# Patient Record
Sex: Male | Born: 1970 | Race: White | Hispanic: No | Smoking: Never smoker
Health system: Southern US, Community
[De-identification: ages and names within clinical notes are randomized; demographics above are authoritative.]

## PROBLEM LIST (undated history)

## (undated) DIAGNOSIS — F329 Major depressive disorder, single episode, unspecified: Secondary | ICD-10-CM

## (undated) DIAGNOSIS — T7840XA Allergy, unspecified, initial encounter: Secondary | ICD-10-CM

## (undated) DIAGNOSIS — M1990 Unspecified osteoarthritis, unspecified site: Secondary | ICD-10-CM

## (undated) DIAGNOSIS — I4821 Permanent atrial fibrillation: Secondary | ICD-10-CM

## (undated) DIAGNOSIS — B019 Varicella without complication: Secondary | ICD-10-CM

## (undated) DIAGNOSIS — I10 Essential (primary) hypertension: Secondary | ICD-10-CM

## (undated) DIAGNOSIS — I639 Cerebral infarction, unspecified: Secondary | ICD-10-CM

## (undated) DIAGNOSIS — I509 Heart failure, unspecified: Secondary | ICD-10-CM

## (undated) DIAGNOSIS — Z8679 Personal history of other diseases of the circulatory system: Secondary | ICD-10-CM

## (undated) DIAGNOSIS — R601 Generalized edema: Secondary | ICD-10-CM

## (undated) DIAGNOSIS — C801 Malignant (primary) neoplasm, unspecified: Secondary | ICD-10-CM

## (undated) DIAGNOSIS — E119 Type 2 diabetes mellitus without complications: Secondary | ICD-10-CM

## (undated) DIAGNOSIS — N183 Chronic kidney disease, stage 3 (moderate): Secondary | ICD-10-CM

## (undated) DIAGNOSIS — I428 Other cardiomyopathies: Secondary | ICD-10-CM

## (undated) DIAGNOSIS — R531 Weakness: Secondary | ICD-10-CM

## (undated) DIAGNOSIS — F32A Depression, unspecified: Secondary | ICD-10-CM

## (undated) DIAGNOSIS — E7800 Pure hypercholesterolemia, unspecified: Secondary | ICD-10-CM

## (undated) DIAGNOSIS — I5022 Chronic systolic (congestive) heart failure: Secondary | ICD-10-CM

## (undated) HISTORY — DX: Depression, unspecified: F32.A

## (undated) HISTORY — DX: Unspecified osteoarthritis, unspecified site: M19.90

## (undated) HISTORY — DX: Varicella without complication: B01.9

## (undated) HISTORY — DX: Generalized edema: R60.1

## (undated) HISTORY — PX: HERNIA REPAIR: SHX51

## (undated) HISTORY — DX: Chronic kidney disease, stage 3 (moderate): N18.3

## (undated) HISTORY — DX: Type 2 diabetes mellitus without complications: E11.9

## (undated) HISTORY — DX: Cerebral infarction, unspecified: I63.9

## (undated) HISTORY — DX: Weakness: R53.1

## (undated) HISTORY — DX: Personal history of other diseases of the circulatory system: Z86.79

## (undated) HISTORY — DX: Allergy, unspecified, initial encounter: T78.40XA

## (undated) HISTORY — PX: ABDOMINAL HERNIA REPAIR: SHX539

---
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
## 2006-12-08 ENCOUNTER — Encounter (INDEPENDENT_AMBULATORY_CARE_PROVIDER_SITE_OTHER): Payer: Self-pay | Admitting: General Surgery

## 2006-12-08 ENCOUNTER — Inpatient Hospital Stay (HOSPITAL_COMMUNITY): Admission: EM | Admit: 2006-12-08 | Discharge: 2006-12-13 | Payer: Self-pay | Admitting: Emergency Medicine

## 2008-12-04 ENCOUNTER — Emergency Department (HOSPITAL_COMMUNITY): Admission: EM | Admit: 2008-12-04 | Discharge: 2008-12-04 | Payer: Self-pay | Admitting: Emergency Medicine

## 2010-07-06 LAB — CBC
Hemoglobin: 15.4 g/dL (ref 13.0–17.0)
MCHC: 33.7 g/dL (ref 30.0–36.0)
RBC: 5.38 MIL/uL (ref 4.22–5.81)
RDW: 12.7 % (ref 11.5–15.5)

## 2010-07-06 LAB — COMPREHENSIVE METABOLIC PANEL
ALT: 35 U/L (ref 0–53)
AST: 25 U/L (ref 0–37)
Alkaline Phosphatase: 70 U/L (ref 39–117)
Calcium: 9.2 mg/dL (ref 8.4–10.5)
GFR calc Af Amer: >60 mL/min (ref >60)
Potassium: 4.2 mEq/L (ref 3.5–5.1)
Sodium: 134 mEq/L — ABNORMAL LOW (ref 135–145)
Total Protein: 7.2 g/dL (ref 6.0–8.3)

## 2010-07-06 LAB — DIFFERENTIAL
Basophils Relative: 1 % (ref 0–1)
Eosinophils Absolute: 0.1 10*3/uL (ref 0.0–0.7)
Eosinophils Relative: 1 % (ref 0–5)
Lymphs Abs: 2.4 10*3/uL (ref 0.7–4.0)
Monocytes Absolute: 0.6 10*3/uL (ref 0.1–1.0)
Monocytes Relative: 5 % (ref 3–12)

## 2010-07-06 LAB — URINALYSIS, ROUTINE W REFLEX MICROSCOPIC
Bilirubin Urine: NEGATIVE
Glucose, UA: >1000 mg/dL — AB
Hgb urine dipstick: NEGATIVE
Ketones, ur: 15 mg/dL — AB
Leukocytes, UA: NEGATIVE
pH: 5 (ref 5.0–8.0)

## 2010-07-06 LAB — URINE MICROSCOPIC-ADD ON

## 2010-08-14 NOTE — H&P (Signed)
NAMEKERN, GINGRAS NO.:  [id number]   MEDICAL RECORD NO.:  [id number]          PATIENT TYPE:  EMS   LOCATION:  ED                           FACILITY:  [facility]   PHYSICIAN:  Ladell Pier, M.D.   DATE OF BIRTH:  1970-07-07   DATE OF ADMISSION:  12/08/2006  DATE OF DISCHARGE:                              HISTORY & PHYSICAL   CHIEF COMPLAINT:  Nausea, vomiting, abdominal pain.   HISTORY OF PRESENT ILLNESS:  The patient is a 40 year old white male who  presented to the ED with nausea, vomiting times 24 hours.  He said he  has vomited multiple times over the 24 period.  He said that he also  passed out at work secondary to feeling dehydrated and weak from the  nausea, vomiting.  He has had no sick contacts, no fevers no chills.  He  has not been taking his regular medications for diabetes and  hypertension secondary to having no money and no insurance for either  medications.   PAST MEDICAL HISTORY:  Significant for diabetes, hypertension,  dyslipidemia, morbid obesity, history of arthritis in the knees with  history of knee surgery in the past.   FAMILY HISTORY:  Mother and father both have diabetes.  His father also  has high cholesterol and heart disease.  Mother is 64.  Father 100.   SOCIAL HISTORY:  No tobacco or alcohol use, is married.  He works as a  Production designer, theatre/television/film at [employer].  No children.   MEDICATIONS:  He is presently not taking his medications but he is he  was placed on:  1. Glucophage 500 mg 3 q.h.s.  2. Gemfibrozil b.i.d.  3. Naprosyn b.i.d. p.r.n.  4. Norvasc 10 mg daily.  5. Actos 30 mg daily.  6. Benazepril 20 mg daily.  7. Aspirin 81 mg daily.   ALLERGIES:  PENICILLIN causes rash.   REVIEW OF SYSTEMS:  As per stated in HPI.   PHYSICAL EXAMINATION:  Temperature 98.4, blood pressure 144/88, pulse of  87, respirations 20, pulse ox 95% on room air.  HEENT: Head is normocephalic, atraumatic.  Pupils equal and reactive to  light without  erythema.  CARDIOVASCULAR:  Regular rhythm.  LUNGS:  Clear bilaterally.  ABDOMEN:  Soft, nontender, nondistended.  Positive bowel sounds.  He  does have some tenderness over the umbilical hernia in the umbilical  area.  EXTREMITIES:  No edema, 2+ DP pulses bilaterally.  NEUROLOGICAL EXAM:  Nonfocal.   LABS:  Sodium 135, potassium 4.4, chloride 100, pCO2 23, BUN 8,  creatinine 0.79, glucose 333.  WBC 14.4, hemoglobin 16.2, platelet 310,  MCV 32.2, AST 26, ALT 44.   ASSESSMENT/PLAN:  1. Nausea, vomiting.  2. Diabetes.  3. Hypertension.  4. Obesity.  5. Leukocytosis.   Will admit the patient to the hospital, IV fluids.  Will restart his  diabetes medication in addition to Lantus and sliding scale insulin,  will start him on Cipro for possibly bacterial gastroenteritis and will  get a CT of the abdomen and pelvis since he is tender over the hernia  region.  We will  continue Norvasc and hold the Lotensin with his mild  dehydration.      Ladell Pier, M.D.  Electronically Signed     NJ/MEDQ  D:  12/08/2006  T:  12/09/2006  Job:  [id number]

## 2010-08-14 NOTE — Consult Note (Signed)
NAMELEVERETT, CAMPLIN               ACCOUNT NO.:  [id number]   MEDICAL RECORD NO.:  [id number]          PATIENT TYPE:  INP   LOCATION:  1314                         FACILITY:  [facility]   PHYSICIAN:  Angelia Mould. Derrell Lolling, M.D.DATE OF BIRTH:  November 19, 1970   DATE OF CONSULTATION:  12/09/2006  DATE OF DISCHARGE:                                 CONSULTATION   REASON FOR CONSULTATION:  Evaluate bowel obstruction and hernia.   HISTORY OF PRESENT ILLNESS:  This is a 40 year old white man with morbid  obesity, diabetes and hypertension.  He reports a 48-hour history of  abdominal pain, nausea and vomiting.  He felt dehydrated and passed out  at work.  This has never happened to him before.  He has not had any  abdominal surgery before.  He came to the emergency room and was  admitted last night by Dr. Olena Leatherwood.  A CT scan has been obtained which  shows a small bowel obstruction with a transition zone in what appears  to be an incarcerated ventral hernia in the region of the umbilicus.  I  was called to see him this morning by Dr. Karilyn Cota once the CT scan was  reviewed.  It is notable the patient has not been taking his regular  medications for diabetes and hypertension stating that he cannot afford  them.  He does not have a primary care physician.   PAST MEDICAL HISTORY:  Significant for diabetes, hypertension,  hyperlipidemia, morbid obesity, history of arthritis in the knees and  history of knee surgery in the past.  No history of abdominal surgery in  the past.   MEDICATIONS:  He is presently not taking his medications but he is  supposed to be taking Glucophage 500 mg at bedtime, gemfibrozil b.i.d..  Naprosyn b.i.d. p.r.n., Norvasc 10 mg daily, Actos 30 mg daily,  benazepril 20 mg daily.  Aspirin 81 mg daily.   DRUG ALLERGIES:  PENICILLIN causes a rash.   FAMILY HISTORY:  Mother and father both have diabetes.  Father has had  elevated cholesterol and heart disease.  Mother is 58, father is  60.   SOCIAL HISTORY:  Denies tobacco or alcohol use.  He is married.  Works  as a Production designer, theatre/television/film at [employer].  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   REVIEW OF SYSTEMS:  15 system review of systems performed and is  noncontributory except as described above.   PHYSICAL EXAM:  Morbidly obese white man in minimal distress.  Temperature 98.1, blood pressure 155/96, pulse 94, respiratory rate 20.  NECK:  No mass.  No obvious jugular distention.  LUNGS:  Clear to auscultation.  No chest wall tenderness.  HEART:  Regular rate and rhythm.  No ectopy.  Radial pulses are  palpable.  ABDOMEN:  Morbidly obese, soft.  He has an obvious incarcerated hernia  projecting above the umbilicus.  The umbilicus itself is wide and deep  and has a chronic dermatitis with foul-smelling fluid in it.  There is  no purulence however.  The rest of the abdomen is fairly soft but  palpation laterally causes pain centrally at the  umbilicus.  The hernia  is not reducible.  I do not feel any other masses.  EXTREMITIES:  He moves all four extremities well without deformity.  NEUROLOGIC:  No gross motor or sensory deficits.   ADMISSION DATA:  A CT scan described above.  Incarcerated, possibly  strangulated ventral hernia with small bowel obstruction.  Admission  glucose is 333.  Sodium 135, potassium 4.4, chloride 100, BUN eight,  hemoglobin 16.2, white blood cell count 14,400   ASSESSMENT:  1. Small bowel obstruction secondary to incarcerated and possibly      strangulated ventral and umbilical hernia.  2. Morbid obesity.  3. Dermatitis and possibly cellulitis of the umbilicus, question      whether this is chronic.  4. Diabetes mellitus.  5. Hypertension.  6. Medical noncompliance.   PLAN:  1. The patient will be taken to operating room later today for      exploratory laparotomy, possible bowel resection, release of his      small bowel obstruction and repair of his hernia.  This repair will      probably need to be a primary  repair due to the high risk of      infection of a piece of mesh.  2. I have discussed the indications and details of surgery with the      patient and his multiple family members.  Risks and complications      have been outlined, including but not limited to bleeding,      infection, dehiscence of the hernia, recurrent hernia, bowel      resection, intra-abdominal abscess, cardiac, pulmonary and      thromboembolic problems.  He seems to understand these issues well.      At this time all his questions are answered.  He is in full      agreement with this plan.      Angelia Mould. Derrell Lolling, M.D.  Electronically Signed     HMI/MEDQ  D:  12/09/2006  T:  12/09/2006  Job:  [id number]   cc:   Wilson Singer, M.D.  Fax: [phone]   Ladell Pier, M.D.

## 2010-08-14 NOTE — Op Note (Signed)
Jeremiah Vance, Jeremiah Vance               ACCOUNT NO.:  [id number]   MEDICAL RECORD NO.:  [id number]          PATIENT TYPE:  INP   LOCATION:  1314                         FACILITY:  [facility]   PHYSICIAN:  Angelia Mould. Derrell Lolling, M.D.DATE OF BIRTH:  1971/01/23   DATE OF PROCEDURE:  12/09/2006  DATE OF DISCHARGE:                               OPERATIVE REPORT   PREOPERATIVE DIAGNOSIS:  Small-bowel obstruction, incarcerated ventral  hernia.   POSTOPERATIVE DIAGNOSIS:  Small-bowel obstruction, incarcerated ventral  hernia.   OPERATION PERFORMED:  1. Exploratory laparotomy with lysis of adhesions to release small      bowel obstruction.  2. Partial omentectomy.  3. Excision of umbilicus.  4. Repair of ventral hernia.   SURGEON:  Dr. Claud Kelp.   FIRST ASSISTANT:  Dr. Leonie Man.   OPERATIVE INDICATIONS:  This is a 40 year old morbidly obese gentleman  with diabetes, hypertension and medical noncompliance with his  medications.  He does not know whether he had an umbilical hernia before  but presents with a 2-day history of abdominal pain, a mass above his  umbilicus and nausea and vomiting.  On exam he has a soft ball sized  mass protruding at the superior rim of the umbilicus.  The umbilicus was  reddened as with chronic dermatitis possibly cellulitis.  CT scan had  been obtained showing loops of small bowel caught in ventral hernia and  that was thought to be the transition point for a small bowel  obstruction.  I was called after the CT scan was done.  He is operated  upon emergently.   OPERATIVE FINDINGS:  The patient had a ventral hernia defect  approximately 8 cm in diameter.  A large amount of omentum was caught in  this and one loop of small bowel which was edematous but was perfectly  viable.  There were no other adhesions.  There was no foul odor.  There  was no sign of any ischemia.  The omentum looked like it had lost the  right of domain and could not be returned to the  abdomen and part of it  had to be resected.  The umbilical skin looked very bad and so I  resected that to avoid skin necrosis and infection.   OPERATIVE TECHNIQUE:  Following induction of general endotracheal  anesthesia, Foley catheter was inserted without difficulty.  The abdomen  was prepped and draped in sterile fashion.  The patient had previously  been given antibiotics.  A vertically oriented elliptical incision was  made to excise the umbilical skin.  Dissection was carried down into  subcutaneous tissue.  We dissected the skin away from the hernia sac.  We dissected the hernia sac away from the subcutaneous tissue all the  way around.  We entered the hernia sac inferiorly near the umbilicus and  found that we could open the hernia sac easily without injuring the  small bowel.  The small bowel was noted to be viable.   We discarded the umbilical skin.   We had to incise the fascia inferiorly for about 2 cm to open up enough  to where we could return the small bowel to the abdominal cavity and  then that was easy enough to do.  We felt around in the abdominal  cavity, felt no other hernia defects.  Felt no other adhesions.  We  resected the omentum by dividing it using the LigaSure device.  This  omentum was sent for routine histology.   We undermined subcutaneous tissue for about 3 cm in all directions.  We  closed the fascia in the midline with multiple interrupted sutures of #1  Novofil.  We then tied all the sutures and it provided a very secure  primary repair.  We did not feel that synthetic mesh could be used I due  to the infected nature of the umbilicus.  The wound was irrigated with  saline.  The subcutaneous tissue was closed with interrupted sutures of  2-0 Vicryl.  Skin was closed with skin staples and 3-0 nylon sutures.  Clean bandages placed and the patient taken to recovery room in stable  condition.  Estimated blood loss was about 150 mL.  Complications  none.  Sponge needle and instrument counts were correct.      Angelia Mould. Derrell Lolling, M.D.  Electronically Signed     HMI/MEDQ  D:  12/09/2006  T:  12/10/2006  Job:  [id number]   cc:   Wilson Singer, M.D.  Fax: [phone]

## 2010-08-17 NOTE — Discharge Summary (Signed)
NAMEDIONTAE, ROUTE               ACCOUNT NO.:  [id number]   MEDICAL RECORD NO.:  [id number]          PATIENT TYPE:  INP   LOCATION:  1314                         FACILITY:  [facility]   PHYSICIAN:  Wilson Singer, M.D.DATE OF BIRTH:  01-Mar-1971   DATE OF ADMISSION:  12/08/2006  DATE OF DISCHARGE:  12/13/2006                               DISCHARGE SUMMARY   FINAL DISCHARGE DIAGNOSES:  1. Small bowel obstruction status post repair of ventral hernia.  2. Hypertension.  3. Type 2 non-insulin-dependent diabetes mellitus.   CONDITION ON DISCHARGE:  Stable.   DISCHARGE MEDICATIONS:  1. Metformin 500 mg b.i.d.  2. Actos 30 mg daily.   HISTORY:  This 40 year old man was admitted with symptoms of nausea,  vomiting, and abdominal pain.  Please see initial history and physical  examination done by Dr. Ladell Pier.   HOSPITAL PROGRESS:  The patient was admitted and initially it was not  clear what the source of the abdominal pain was.  He underwent a CT of  the abdomen and he was found to have a small bowel obstruction due to a  possible incarcerated or strangulated ventral hernia.  He was seen by  Dr. Claud Kelp, surgeon who felt he needed surgery soon and he  underwent surgery on December 09, 2006, where he had an exploratory  laparotomy and release of adhesions.  Postoperatively, he did well and was slowly able to be weaned off  intravenous fluids and was slowly able to be started on a diet.  He did  well with the diet.  His diabetes was controlled by subcutaneous insulin  after he was in the hospital.  On the day of discharge, after he was  cleared from surgery's point-of-view, he looked well.  He had opened his  bowels, and he had tolerated a fluid diet.   PHYSICAL EXAMINATION:  VITAL SIGNS:  Temperature 99.1, blood pressure  106/72, pulse 93, saturation 92% on room air.  LUNGS:  Fields were clear.  ABDOMEN:  Soft.   INVESTIGATIONS:  Done the day before showed a sodium of  137, potassium  3.7, bicarbonate 35, BUN 3, creatinine 0.77.  Hemoglobin 12.8, white  blood cell count 9.3, platelets 227.   FURTHER DISPOSITION:  The patient will be discharged and will follow up  for the surgery.  He has been given Tylox for pain and he has been  restarted on his home oral hypoglycemic medications.  He needs to follow  up with Dr. Claud Kelp, surgeon, in about one week's time.      Wilson Singer, M.D.  Electronically Signed     NCG/MEDQ  D:  01/15/2007  T:  01/15/2007  Job:  [id number]

## 2011-01-11 LAB — COMPREHENSIVE METABOLIC PANEL
ALT: 30
ALT: 44
AST: 18
Albumin: 2.7 — ABNORMAL LOW
Albumin: 3.6
Alkaline Phosphatase: 76
CO2: 33 — ABNORMAL HIGH
Calcium: 8.1 — ABNORMAL LOW
Chloride: 100
GFR calc Af Amer: >60
GFR calc non Af Amer: >60
Glucose, Bld: 333 — ABNORMAL HIGH
Potassium: 4.4
Sodium: 135
Sodium: 136
Total Protein: 5.9 — ABNORMAL LOW
Total Protein: 8.2

## 2011-01-11 LAB — BASIC METABOLIC PANEL
BUN: 1 — ABNORMAL LOW
BUN: 3 — ABNORMAL LOW
CO2: 34 — ABNORMAL HIGH
Calcium: 8.4
Calcium: 8.6
Chloride: 98
Creatinine, Ser: 0.69
GFR calc Af Amer: >60
GFR calc Af Amer: >60
GFR calc non Af Amer: >60
GFR calc non Af Amer: >60
GFR calc non Af Amer: >60
GFR calc non Af Amer: >60
Glucose, Bld: 121 — ABNORMAL HIGH
Glucose, Bld: 202 — ABNORMAL HIGH
Potassium: 3.3 — ABNORMAL LOW
Potassium: 3.7
Potassium: 4.5
Sodium: 135
Sodium: 136
Sodium: 137
Sodium: 137

## 2011-01-11 LAB — DIFFERENTIAL
Basophils Relative: 0
Eosinophils Absolute: 0
Monocytes Absolute: 0.4
Monocytes Relative: 3
Neutrophils Relative %: 90 — ABNORMAL HIGH

## 2011-01-11 LAB — CBC
HCT: 37.5 — ABNORMAL LOW
HCT: 43.4
Hemoglobin: 12.8 — ABNORMAL LOW
Hemoglobin: 13.2
Hemoglobin: 14.9
Hemoglobin: 16.2
MCHC: 34.1
MCHC: 34.1
Platelets: 244
Platelets: 269
RBC: 4.51
RBC: 4.71
RDW: 12.4
RDW: 12.6
RDW: 13.1
WBC: 14.4 — ABNORMAL HIGH
WBC: 9.3
WBC: 9.5

## 2011-01-11 LAB — URINALYSIS, ROUTINE W REFLEX MICROSCOPIC
Bilirubin Urine: NEGATIVE
Glucose, UA: >1000 — AB
pH: 5.5

## 2011-01-11 LAB — CULTURE, BLOOD (ROUTINE X 2): Culture: NO GROWTH

## 2011-01-11 LAB — URINE MICROSCOPIC-ADD ON

## 2011-01-11 LAB — LIPID PANEL
Total CHOL/HDL Ratio: 5.8
VLDL: 18

## 2011-01-11 LAB — WOUND CULTURE

## 2017-06-30 DIAGNOSIS — N183 Chronic kidney disease, stage 3 unspecified: Secondary | ICD-10-CM

## 2017-06-30 HISTORY — DX: Chronic kidney disease, stage 3 unspecified: N18.30

## 2017-07-10 ENCOUNTER — Emergency Department (HOSPITAL_COMMUNITY): Payer: Self-pay

## 2017-07-10 ENCOUNTER — Other Ambulatory Visit: Payer: Self-pay

## 2017-07-10 ENCOUNTER — Encounter (HOSPITAL_COMMUNITY): Payer: Self-pay | Admitting: Emergency Medicine

## 2017-07-10 ENCOUNTER — Inpatient Hospital Stay (HOSPITAL_COMMUNITY)
Admission: EM | Admit: 2017-07-10 | Discharge: 2017-07-18 | DRG: 291 | Disposition: A | Discharge status: Home / Self Care | Payer: Self-pay | Attending: Internal Medicine | Admitting: Internal Medicine

## 2017-07-10 DIAGNOSIS — D649 Anemia, unspecified: Secondary | ICD-10-CM | POA: Diagnosis present

## 2017-07-10 DIAGNOSIS — R808 Other proteinuria: Secondary | ICD-10-CM

## 2017-07-10 DIAGNOSIS — R601 Generalized edema: Secondary | ICD-10-CM

## 2017-07-10 DIAGNOSIS — R945 Abnormal results of liver function studies: Secondary | ICD-10-CM

## 2017-07-10 DIAGNOSIS — E8809 Other disorders of plasma-protein metabolism, not elsewhere classified: Secondary | ICD-10-CM | POA: Diagnosis present

## 2017-07-10 DIAGNOSIS — I5021 Acute systolic (congestive) heart failure: Secondary | ICD-10-CM | POA: Diagnosis present

## 2017-07-10 DIAGNOSIS — R809 Proteinuria, unspecified: Secondary | ICD-10-CM

## 2017-07-10 DIAGNOSIS — N19 Unspecified kidney failure: Secondary | ICD-10-CM | POA: Diagnosis not present

## 2017-07-10 DIAGNOSIS — I110 Hypertensive heart disease with heart failure: Principal | ICD-10-CM | POA: Diagnosis present

## 2017-07-10 DIAGNOSIS — E1121 Type 2 diabetes mellitus with diabetic nephropathy: Secondary | ICD-10-CM | POA: Diagnosis present

## 2017-07-10 DIAGNOSIS — Z833 Family history of diabetes mellitus: Secondary | ICD-10-CM

## 2017-07-10 DIAGNOSIS — K760 Fatty (change of) liver, not elsewhere classified: Secondary | ICD-10-CM | POA: Diagnosis present

## 2017-07-10 DIAGNOSIS — B354 Tinea corporis: Secondary | ICD-10-CM | POA: Diagnosis present

## 2017-07-10 DIAGNOSIS — I10 Essential (primary) hypertension: Secondary | ICD-10-CM | POA: Diagnosis present

## 2017-07-10 DIAGNOSIS — N5089 Other specified disorders of the male genital organs: Secondary | ICD-10-CM | POA: Diagnosis present

## 2017-07-10 DIAGNOSIS — E1165 Type 2 diabetes mellitus with hyperglycemia: Secondary | ICD-10-CM

## 2017-07-10 DIAGNOSIS — I5030 Unspecified diastolic (congestive) heart failure: Secondary | ICD-10-CM

## 2017-07-10 DIAGNOSIS — R7989 Other specified abnormal findings of blood chemistry: Secondary | ICD-10-CM

## 2017-07-10 DIAGNOSIS — Z6841 Body Mass Index (BMI) 40.0 and over, adult: Secondary | ICD-10-CM

## 2017-07-10 DIAGNOSIS — K8020 Calculus of gallbladder without cholecystitis without obstruction: Secondary | ICD-10-CM | POA: Diagnosis present

## 2017-07-10 DIAGNOSIS — E8770 Fluid overload, unspecified: Secondary | ICD-10-CM | POA: Diagnosis present

## 2017-07-10 HISTORY — DX: Anemia, unspecified: D64.9

## 2017-07-10 HISTORY — DX: Morbid (severe) obesity due to excess calories: E66.01

## 2017-07-10 HISTORY — DX: Other disorders of bilirubin metabolism: E80.6

## 2017-07-10 HISTORY — DX: Essential (primary) hypertension: I10

## 2017-07-10 LAB — URINALYSIS, ROUTINE W REFLEX MICROSCOPIC
BILIRUBIN URINE: NEGATIVE
Glucose, UA: NEGATIVE mg/dL
Hgb urine dipstick: NEGATIVE
KETONES UR: NEGATIVE mg/dL
LEUKOCYTES UA: NEGATIVE
Nitrite: NEGATIVE
Protein, ur: 100 mg/dL — AB
Specific Gravity, Urine: 1.014 (ref 1.005–1.030)
pH: 6 (ref 5.0–8.0)

## 2017-07-10 LAB — BRAIN NATRIURETIC PEPTIDE: B Natriuretic Peptide: 852 pg/mL — ABNORMAL HIGH (ref 0.0–100.0)

## 2017-07-10 LAB — COMPREHENSIVE METABOLIC PANEL
ALBUMIN: 3 g/dL — AB (ref 3.5–5.0)
ALT: 12 U/L — ABNORMAL LOW (ref 17–63)
ANION GAP: 11 (ref 5–15)
AST: 22 U/L (ref 15–41)
Alkaline Phosphatase: 240 U/L — ABNORMAL HIGH (ref 38–126)
BUN: 16 mg/dL (ref 6–20)
CHLORIDE: 104 mmol/L (ref 101–111)
CO2: 23 mmol/L (ref 22–32)
Calcium: 8.7 mg/dL — ABNORMAL LOW (ref 8.9–10.3)
Creatinine, Ser: 0.85 mg/dL (ref 0.61–1.24)
GFR calc Af Amer: >60 mL/min (ref >60)
GFR calc non Af Amer: >60 mL/min (ref >60)
GLUCOSE: 115 mg/dL — AB (ref 65–99)
POTASSIUM: 4.1 mmol/L (ref 3.5–5.1)
SODIUM: 138 mmol/L (ref 135–145)
Total Bilirubin: 2.5 mg/dL — ABNORMAL HIGH (ref 0.3–1.2)
Total Protein: 7.3 g/dL (ref 6.5–8.1)

## 2017-07-10 LAB — GLUCOSE, CAPILLARY: Glucose-Capillary: 97 mg/dL (ref 65–99)

## 2017-07-10 LAB — CBC WITH DIFFERENTIAL/PLATELET
Basophils Absolute: 0 10*3/uL (ref 0.0–0.1)
Basophils Relative: 1 %
Eosinophils Absolute: 0.2 10*3/uL (ref 0.0–0.7)
Eosinophils Relative: 2 %
HEMATOCRIT: 39.7 % (ref 39.0–52.0)
Hemoglobin: 12.4 g/dL — ABNORMAL LOW (ref 13.0–17.0)
LYMPHS ABS: 1.3 10*3/uL (ref 0.7–4.0)
LYMPHS PCT: 21 %
MCH: 28.3 pg (ref 26.0–34.0)
MCHC: 31.2 g/dL (ref 30.0–36.0)
MCV: 90.6 fL (ref 78.0–100.0)
MONO ABS: 0.6 10*3/uL (ref 0.1–1.0)
MONOS PCT: 10 %
Neutro Abs: 4.4 10*3/uL (ref 1.7–7.7)
Neutrophils Relative %: 66 %
Platelets: 310 10*3/uL (ref 150–400)
RBC: 4.38 MIL/uL (ref 4.22–5.81)
RDW: 14.9 % (ref 11.5–15.5)
WBC: 6.5 10*3/uL (ref 4.0–10.5)

## 2017-07-10 LAB — MAGNESIUM: Magnesium: 1.8 mg/dL (ref 1.7–2.4)

## 2017-07-10 LAB — CBG MONITORING, ED: Glucose-Capillary: 112 mg/dL — ABNORMAL HIGH (ref 65–99)

## 2017-07-10 MED ORDER — FENTANYL CITRATE (PF) 100 MCG/2ML IJ SOLN
50.0000 ug | Freq: Once | INTRAMUSCULAR | Status: AC
  Administered 2017-07-10: 50 ug via INTRAVENOUS
  Filled 2017-07-10: qty 2

## 2017-07-10 MED ORDER — FUROSEMIDE 10 MG/ML IJ SOLN
60.0000 mg | Freq: Once | INTRAMUSCULAR | Status: AC
  Administered 2017-07-10: 60 mg via INTRAVENOUS
  Filled 2017-07-10: qty 6

## 2017-07-10 MED ORDER — ONDANSETRON HCL 4 MG/2ML IJ SOLN: 4.0000 mg | Freq: Four times a day (QID) | INTRAMUSCULAR | Status: DC | PRN

## 2017-07-10 MED ORDER — ONDANSETRON HCL 4 MG PO TABS: 4.0000 mg | ORAL_TABLET | Freq: Four times a day (QID) | ORAL | Status: DC | PRN

## 2017-07-10 MED ORDER — HYDROCODONE-ACETAMINOPHEN 7.5-325 MG PO TABS
1.0000 | ORAL_TABLET | Freq: Four times a day (QID) | ORAL | Status: DC | PRN
  Administered 2017-07-10 – 2017-07-12 (×4): 1 via ORAL
  Administered 2017-07-12 – 2017-07-17 (×8): 2 via ORAL
  Administered 2017-07-18: 1 via ORAL
  Filled 2017-07-10 (×2): qty 2
  Filled 2017-07-10 (×2): qty 1
  Filled 2017-07-10 (×4): qty 2
  Filled 2017-07-10: qty 1
  Filled 2017-07-10 (×2): qty 2
  Filled 2017-07-10 (×2): qty 1

## 2017-07-10 MED ORDER — POTASSIUM CHLORIDE CRYS ER 20 MEQ PO TBCR
20.0000 meq | EXTENDED_RELEASE_TABLET | Freq: Three times a day (TID) | ORAL | Status: DC
  Administered 2017-07-11 – 2017-07-17 (×21): 20 meq via ORAL
  Filled 2017-07-10 (×21): qty 1

## 2017-07-10 MED ORDER — POTASSIUM CHLORIDE CRYS ER 20 MEQ PO TBCR
40.0000 meq | EXTENDED_RELEASE_TABLET | Freq: Once | ORAL | Status: AC
Start: 2017-07-10 — End: 2017-07-10
  Administered 2017-07-10: 40 meq via ORAL
  Filled 2017-07-10: qty 2

## 2017-07-10 MED ORDER — ENALAPRILAT 1.25 MG/ML IV SOLN
2.5000 mg | Freq: Once | INTRAVENOUS | Status: AC
  Administered 2017-07-10: 2.5 mg via INTRAVENOUS
  Filled 2017-07-10: qty 2

## 2017-07-10 MED ORDER — LISINOPRIL 5 MG PO TABS
5.0000 mg | ORAL_TABLET | Freq: Every day | ORAL | Status: DC
  Administered 2017-07-11: 5 mg via ORAL
  Filled 2017-07-10: qty 1

## 2017-07-10 MED ORDER — IOPAMIDOL (ISOVUE-300) INJECTION 61%
125.0000 mL | Freq: Once | INTRAVENOUS | Status: AC | PRN
  Administered 2017-07-10: 125 mL via INTRAVENOUS

## 2017-07-10 MED ORDER — ACETAMINOPHEN 325 MG PO TABS: 650.0000 mg | ORAL_TABLET | Freq: Four times a day (QID) | ORAL | Status: DC | PRN

## 2017-07-10 MED ORDER — FUROSEMIDE 10 MG/ML IJ SOLN
80.0000 mg | Freq: Two times a day (BID) | INTRAMUSCULAR | Status: DC
  Administered 2017-07-11 – 2017-07-17 (×14): 80 mg via INTRAVENOUS
  Filled 2017-07-10 (×15): qty 8

## 2017-07-10 NOTE — Progress Notes (Signed)
Attempted to get report from ER with no response.

## 2017-07-10 NOTE — ED Triage Notes (Signed)
Patient reports edema and pain to his testicles that started this am.

## 2017-07-10 NOTE — ED Provider Notes (Signed)
Emergency Department Provider Note   I have reviewed the triage vital signs and the nursing notes.   HISTORY  Chief Complaint Testicle Pain   HPI Jeremiah Vance is a 47 y.o. male with PMH of elevated BMI and DM resents to the emergency department for evaluation of acute onset scrotal pain and swelling.  Patient states that symptoms began abruptly this morning.  Denies fevers or chills.  No obvious rash.  Denies any abdominal pain, nausea, vomiting.  Denies lower extremity swelling.  Denies any injury to the area.  He states he did have a fall approximately a week ago but primarily hurt his left knee during that event.  No radiation of symptoms or modifying factors.  He has no dysuria, hesitancy, urgency. No history of similar.    Past Medical History:  Diagnosis Date  . Diabetes mellitus without complication ([place])   . Hypertension   . Morbid obesity [facility])     Patient Active Problem List   Diagnosis Date Noted  . Hypoalbuminemia 07/11/2017  . Tinea corporis 07/11/2017  . Volume overload 07/10/2017  . Hyperbilirubinemia 07/10/2017  . Anemia 07/10/2017  . Diabetes mellitus without complication ([place]) 93/26/7124  . Hypertension 07/10/2017    Past Surgical History:  Procedure Laterality Date  . HERNIA REPAIR        Allergies Patient has no known allergies.  Family History  Problem Relation Age of Onset  . Diabetes Father   . Diabetes Sister     Social History Social History   Tobacco Use  . Smoking status: Never Smoker  . Smokeless tobacco: Never Used  Substance Use Topics  . Alcohol use: Never    Frequency: Never  . Drug use: Never    Review of Systems  Constitutional: No fever/chills Eyes: No visual changes. ENT: No sore throat. Cardiovascular: Denies chest pain. Respiratory: Denies shortness of breath. Gastrointestinal: No abdominal pain.  No nausea, no vomiting.  No diarrhea.  No constipation. Genitourinary: Negative for  dysuria. Musculoskeletal: Negative for back pain. Skin: Negative for rash. Neurological: Negative for headaches, focal weakness or numbness.  10-point ROS otherwise negative.  ____________________________________________   PHYSICAL EXAM:  VITAL SIGNS: ED Triage Vitals [07/10/17 1245]  Enc Vitals Group     BP (!) 199/111     Pulse Rate 90     Resp 18     Temp 98.2 F (36.8 C)     Temp Source Oral     SpO2 96 %     Weight (!) 380 lb (172.4 kg)     Height _0  (1.778 m)     Pain Score 8   Constitutional: Alert and oriented. Well appearing and in no acute distress. Eyes: Conjunctivae are normal. Head: Atraumatic. Nose: No congestion/rhinnorhea. Mouth/Throat: Mucous membranes are moist.  Oropharynx non-erythematous. Neck: No stridor.  Cardiovascular: Normal rate, regular rhythm. Good peripheral circulation. Grossly normal heart sounds.   Respiratory: Normal respiratory effort.  No retractions. Lungs CTAB. Gastrointestinal: Soft and nontender. No distention. Some pitting edema in the lower abdomen.  Genitourinary: Diffuse scrotal edema with mild erythema. No skin breakdown or scrotal wound.  Musculoskeletal: No lower extremity tenderness. 3+ pitting edema bilaterally with edema in the lower abdominal wall and scrotum. No gross deformities of extremities. Neurologic:  Normal speech and language. No gross focal neurologic deficits are appreciated.  Skin:  Skin is warm, dry and intact. No rash noted.  ____________________________________________   LABS (all labs ordered are listed, but only abnormal results are  displayed)  Labs Reviewed  URINALYSIS, ROUTINE W REFLEX MICROSCOPIC - Abnormal; Notable for the following components:      Result Value   APPearance HAZY (*)    Protein, ur 100 (*)    Bacteria, UA RARE (*)    Squamous Epithelial / LPF 0-5 (*)    All other components within normal limits  COMPREHENSIVE METABOLIC PANEL - Abnormal; Notable for the following  components:   Glucose, Bld 115 (*)    Calcium 8.7 (*)    Albumin 3.0 (*)    ALT 12 (*)    Alkaline Phosphatase 240 (*)    Total Bilirubin 2.5 (*)    All other components within normal limits  CBC WITH DIFFERENTIAL/PLATELET - Abnormal; Notable for the following components:   Hemoglobin 12.4 (*)    All other components within normal limits  BRAIN NATRIURETIC PEPTIDE - Abnormal; Notable for the following components:   B Natriuretic Peptide 852.0 (*)    All other components within normal limits  BASIC METABOLIC PANEL - Abnormal; Notable for the following components:   Calcium 8.7 (*)    All other components within normal limits  CBC - Abnormal; Notable for the following components:   RBC 4.17 (*)    Hemoglobin 11.9 (*)    HCT 38.0 (*)    All other components within normal limits  HEPATIC FUNCTION PANEL - Abnormal; Notable for the following components:   Albumin 2.8 (*)    ALT 14 (*)    Alkaline Phosphatase 216 (*)    Total Bilirubin 2.7 (*)    Bilirubin, Direct 1.2 (*)    Indirect Bilirubin 1.5 (*)    All other components within normal limits  RETICULOCYTES - Abnormal; Notable for the following components:   RBC. 4.17 (*)    All other components within normal limits  CBG MONITORING, ED - Abnormal; Notable for the following components:   Glucose-Capillary 112 (*)    All other components within normal limits  MAGNESIUM  GLUCOSE, CAPILLARY  GLUCOSE, CAPILLARY  HIV ANTIBODY (ROUTINE TESTING)  HEMOGLOBIN A1C  VITAMIN B12  FOLATE  IRON AND TIBC  FERRITIN  HEPATITIS PANEL, ACUTE   ____________________________________________  RADIOLOGY  Dg Chest 2 View  Result Date: 07/10/2017 CLINICAL DATA:  Shortness of breath EXAM: CHEST - 2 VIEW COMPARISON:  CT 07/10/2017 FINDINGS: Cardiomegaly with vascular congestion and mild pulmonary edema. Small left pleural effusion. Moderate right pleural effusion. Straight edge on lateral view posteriorly suggests possible loculation. No  pneumothorax. IMPRESSION: 1. Cardiomegaly with vascular congestion and mild pulmonary edema 2. There are bilateral pleural effusions, small on the left and moderate on the right with possible loculation on the right. Electronically Signed   By: Donavan Foil M.D.   On: 07/10/2017 18:43   Ct Abdomen Pelvis W Contrast  Result Date: 07/10/2017 CLINICAL DATA:  47 y/o  M; edema and pain to the testicles. EXAM: CT ABDOMEN AND PELVIS WITH CONTRAST TECHNIQUE: Multidetector CT imaging of the abdomen and pelvis was performed using the standard protocol following bolus administration of intravenous contrast. CONTRAST:  158m ISOVUE-300 IOPAMIDOL (ISOVUE-300) INJECTION 61% COMPARISON:  12/04/2008 CT abdomen and pelvis FINDINGS: Lower chest: Moderate right and small left pleural effusions. Hepatobiliary: No focal liver lesion. Cholelithiasis. No biliary ductal dilatation. Pancreas: Unremarkable. No pancreatic ductal dilatation or surrounding inflammatory changes. Spleen: Small lucencies within the periphery of the spleen that persists on the delayed phase may represent small areas of infarction. No other focal splenic lesion identified. Adrenals/Urinary Tract: Adrenal  glands are unremarkable. Kidneys are normal, without renal calculi, focal lesion, or hydronephrosis. Bladder is unremarkable. Stomach/Bowel: Left paramedian paraumbilical hernia with broad neck measuring 8.1 x 4.4 cm ML BY CC containing fat and small bowel without proximal obstruction. This hernia is increased in size from prior study. Vascular/Lymphatic: Aortic atherosclerosis. No enlarged abdominal or pelvic lymph nodes. Reproductive: Prostate is unremarkable. Extensive scrotal edema. Anasarca. Other: Small volume of ascites. Musculoskeletal: No fracture is seen. IMPRESSION: 1. Anasarca, scrotal edema, small volume ascites, and bilateral pleural effusions compatible with third spacing. 2. Cholelithiasis. 3. Small peripheral lucencies in the spleen, possible  small infarctions. 4. Large paraumbilical hernia containing fat and small bowel without obstruction, increased in size. Electronically Signed   By: Kristine Garbe M.D.   On: 07/10/2017 19:00   US Renal  Result Date: 07/11/2017 CLINICAL DATA:  Proteinuria, diabetes, hypertension, obesity. EXAM: RENAL / URINARY TRACT ULTRASOUND COMPLETE COMPARISON:  None. FINDINGS: Right Kidney: Length: 12.5 cm. Echogenicity within normal limits. No mass or hydronephrosis visualized. Left Kidney: Length: 12 cm. Echogenicity within normal limits. No mass or hydronephrosis visualized. Bladder: Decompressed limiting characterization. IMPRESSION: Kidneys appear normal.  No hydronephrosis. Electronically Signed   By: Franki Cabot M.D.   On: 07/11/2017 09:23   US Scrotum W/doppler  Result Date: 07/10/2017 CLINICAL DATA:  Scrotal swelling and pain.  Pain for 1 day EXAM: SCROTAL ULTRASOUND DOPPLER ULTRASOUND OF THE TESTICLES TECHNIQUE: Complete ultrasound examination of the testicles, epididymis, and other scrotal structures was performed. Color and spectral Doppler ultrasound were also utilized to evaluate blood flow to the testicles. COMPARISON:  None. FINDINGS: Right testicle Measurements: Normal in size and homogeneous echotexture measuring 3.9 by 2.8 x 2.7 cm. No mass or microlithiasis visualized. Left testicle Measurements: Normal in size and homogeneous echotexture measuring 3.8 x 2.7 x 3.4 cm. No mass or microlithiasis visualized. Right epididymis:  Not identified Left epididymis:  But not identified Hydrocele:  None visualized. Varicocele:  None visualized. Pulsed Doppler interrogation of both testes demonstrates normal low resistance arterial and venous waveforms bilaterally. Other: There is extensive scrotal wall edema involving the LEFT hemiscrotum and RIGHT hemiscrotum IMPRESSION: 1. No evidence of testicular torsion or abscess. Normal vascular flow to the LEFT and RIGHT testicle which are normal size. 2.  Extensive edema and thickening of the scrotal wall. Electronically Signed   By: Suzy Bouchard M.D.   On: 07/10/2017 13:57   US Abdomen Limited Ruq  Result Date: 07/11/2017 CLINICAL DATA:  Hyperbilirubinemia. EXAM: ULTRASOUND ABDOMEN LIMITED RIGHT UPPER QUADRANT COMPARISON:  CT 07/10/2017. FINDINGS: Gallbladder: Multiple tiny gallstones noted. Gallstones measure up to 1.6 cm. Gallbladder wall thickness 5 mm. Negative Murphy sign. Common bile duct: Diameter: 3 mm Liver: Increased hepatic echogenicity consistent fatty infiltration and/or pedis cellular disease. Portal vein is patent on color Doppler imaging with normal direction of blood flow towards the liver. Incidental note made of right pleural effusion. IMPRESSION: 1. Cholelithiasis. Mild prominence of the gallbladder wall at 5 mm. Cholecystitis cannot be excluded. Negative Murphy sign. No biliary distention. 2. Increased hepatic echogenicity consistent fatty infiltration and/or hepatocellular disease. No focal hepatic abnormality identified. 3.  Right pleural effusion. Electronically Signed   By: Marcello Moores  Register   On: 07/11/2017 09:16    ____________________________________________   PROCEDURES  Procedure(s) performed:   Procedures  None ____________________________________________   INITIAL IMPRESSION / ASSESSMENT AND PLAN / ED COURSE  Pertinent labs & imaging results that were available during my care of the patient were reviewed by me and considered  in my medical decision making (see chart for details).  Onset scrotal edema and pain.  The patient is standing at bedside and is unable to sit because he is so uncomfortable.  There is mild erythema over the scrotum which is likely secondary mainly to significant swelling rather than cellulitis.  Ultrasound of the scrotum shows no torsion.  Labs reviewed with elevated alk phos and bilirubin.  The patient has no abdominal pain or tenderness over the gallbladder.  I have added on a BNP  and will send the patient for CT abdomen pelvis for further characterization of the scrotum and inguinal canals to rule out underlying infectious process but patient presenting with more of an anasarca picture.    07:00 PM CT imaging resulted with extensive fluid/anasarca picture with pleural effusions, ascites, and diffuse edema. No infection. Labs reviewed with no acute findings other than elevated BNP. Patient has not seen a PMD in many years due to lack of ins. BP elevated here. Will admit for diuresis and further w/u of fluid retention.   Discussed patient's case with Hospitalist to request admission. Patient and family (if present) updated with plan. Care transferred to Hospitalist service.  I reviewed all nursing notes, vitals, pertinent old records, EKGs, labs, imaging (as available).  ____________________________________________  FINAL CLINICAL IMPRESSION(S) / ED DIAGNOSES  Final diagnoses:  Anasarca  Scrotal edema     MEDICATIONS GIVEN DURING THIS VISIT:  Medications  ondansetron (ZOFRAN) tablet 4 mg (has no administration in time range)    Or  ondansetron (ZOFRAN) injection 4 mg (has no administration in time range)  lisinopril (PRINIVIL,ZESTRIL) tablet 5 mg (has no administration in time range)  potassium chloride SA (K-DUR,KLOR-CON) CR tablet 20 mEq (has no administration in time range)  furosemide (LASIX) injection 80 mg (has no administration in time range)  acetaminophen (TYLENOL) tablet 650 mg (has no administration in time range)  HYDROcodone-acetaminophen (NORCO) 7.5-325 MG per tablet 1-2 tablet (1 tablet Oral Given 07/11/17 0656)  clotrimazole (LOTRIMIN) 1 % cream (has no administration in time range)  fentaNYL (SUBLIMAZE) injection 50 mcg (50 mcg Intravenous Given 07/10/17 1705)  iopamidol (ISOVUE-300) 61 % injection 125 mL (125 mLs Intravenous Contrast Given 07/10/17 1750)  furosemide (LASIX) injection 60 mg (60 mg Intravenous Given 07/10/17 2003)  potassium  chloride SA (K-DUR,KLOR-CON) CR tablet 40 mEq (40 mEq Oral Given 07/10/17 2002)  enalaprilat (VASOTEC) injection 2.5 mg (2.5 mg Intravenous Given 07/10/17 2003)    Note:  This document was prepared using Dragon voice recognition software and may include unintentional dictation errors.  Nanda Quinton, MD Emergency Medicine    Roxi Hlavaty, [facility], MD 07/11/17 [id number]

## 2017-07-10 NOTE — H&P (Signed)
History and Physical    Jeremiah Vance ZOX:[id number] DOB: 1970-12-24 DOA: 07/10/2017  PCP: Jeremiah Pih, MD   Patient coming from: Home.  I have personally briefly reviewed patient's old medical records in [facility]  Chief Complaint: Swelling of testicles.  HPI: Jeremiah Vance is a 47 y.o. male with medical history significant of type 2 diabetes, hypertension, morbid obesity who is coming to the emergency department with complaints of pain and edema of his scrotum that started this morning.   Per patient, he noticed some mild swelling on Wednesday.  He states that he normally does not have lower extremity edema.  However, when he woke up on Thursday morning he noticed severe edema of his scrotum, penis, hands and lower extremities.  He denies fever, chills, headache, sore throat, dyspnea, wheezing, hemoptysis, chest pain, palpitations, dizziness, diaphoresis, PND or orthopnea.  No abdominal pain, nausea, emesis, diarrhea or constipation, melena or hematochezia.  Denies oliguria, dysuria, frequency or hematuria.  Denies polyuria, polydipsia or blurred vision.  ED Course: Initial vital signs temperature 36.8C (98.2 F), pulse 90, respirations 18, blood pressure 199/111 mmHg and O2 sat 96% on room air.  Given supplemental oxygen, fentanyl 50 mcg IVP x1 and furosemide 60 mg IVP x1.  I added Vasotec 2.5 mg IVP x1+ K Dur 40 mEq p.o. x1.  His workup shows a urinalysis with proteinuria 100 mg/dL, negative nitrates and leukocytes, microscopic showing 0-5 WBC and RBC with rare bacteria.  White count was 6.5 with a normal differential, hemoglobin 12.4 g/dL (was 81.1 g/dL in September 9147) and platelets 310.  Sodium is 138, potassium 4.1, chloride 104 and CO2 23 millimolar/L.  Glucose 115, BUN 16, creatinine 0.85 and calcium 8.7 mg/dL.  Total protein 7.3 and albumin 3.0 g/dL.  AST was 22, ALT 12 and alkaline phosphatase 240 U/L.  Total bilirubin is increased to 2.5 mg/dL.  1.1 mg/dL on previous  results.  BMP was 852.0 pg/mL.  Review of Systems: As per HPI otherwise 10 point review of systems negative.   Past Medical History:  Diagnosis Date  . Diabetes mellitus without complication (HCC)   . Hypertension   . Morbid obesity (HCC)     Past Surgical History:  Procedure Laterality Date  . HERNIA REPAIR       reports that he has never smoked. He has never used smokeless tobacco. He reports that he does not drink alcohol or use drugs.  No Known Allergies  Family History  Problem Relation Age of Onset  . Diabetes Father   . Diabetes Sister     Prior to Admission medications   Medication Sig Start Date End Date Taking? Authorizing Provider  acetaminophen (TYLENOL) 500 MG tablet Take 500 mg by mouth every 6 (six) hours as needed for mild pain or moderate pain.   Yes [provider]  ibuprofen (ADVIL,MOTRIN) 200 MG tablet Take 200 mg by mouth every 6 (six) hours as needed for mild pain or moderate pain.   Yes [provider]    Physical Exam: Vitals:   07/10/17 1245 07/10/17 1859  BP: (!) 199/111 (!) 216/109  Pulse: 90 88  Resp: 18 20  Temp: 98.2 F (36.8 C)   TempSrc: Oral   SpO2: 96% 98%  Weight: (!) 172.4 kg (380 lb)   Height: 5\' 10"  (1.778 m)     Constitutional: NAD, calm, comfortable Eyes: PERRL, lids and conjunctivae normal ENMT: Mucous membranes are moist. Posterior pharynx clear of any exudate or lesions. Neck:  normal, supple, no masses, no thyromegaly Respiratory: clear to auscultation bilaterally, no wheezing, no crackles. Normal respiratory effort. No accessory muscle use.  Cardiovascular: Regular rate and rhythm, no murmurs / rubs / gallops.  3+ lower extremity pitting edema.  Stage II lymphedema of lower extremities.  2+ pedal pulses. No carotid bruits.  Abdomen: Morbidly obese, soft, no tenderness, no masses palpated. No hepatosplenomegaly. Bowel sounds positive.   Genitalia: Severely edematosus scrotum penis. Musculoskeletal: no  clubbing / cyanosis. Good ROM, no contractures. Normal muscle tone.  Skin: Positive erythema with foul-smelling discharge on suprapubic fold.  Bilateral lower extremities wounds with surrounding erythema.  Please see pictures below. Neurologic: CN 2-12 grossly intact. Sensation intact, DTR normal. Strength 5/5 in all 4.  Psychiatric: Normal judgment and insight. Alert and oriented x 4. Normal mood.    Swollen scrotum with minimal penile visualization.   Suprapubic fold with skin lesion.   RLE   LLE  Labs on Admission: I have personally reviewed following labs and imaging studies  CBC: Recent Labs  Lab 07/10/17 1311  WBC 6.5  NEUTROABS 4.4  HGB 12.4*  HCT 39.7  MCV 90.6  PLT 310   Basic Metabolic Panel: Recent Labs  Lab 07/10/17 1311  NA 138  K 4.1  CL 104  CO2 23  GLUCOSE 115*  BUN 16  CREATININE 0.85  CALCIUM 8.7*   GFR: Estimated Creatinine Clearance: 171.4 mL/min (by C-G formula based on SCr of 0.85 mg/dL). Liver Function Tests: Recent Labs  Lab 07/10/17 1311  AST 22  ALT 12*  ALKPHOS 240*  BILITOT 2.5*  PROT 7.3  ALBUMIN 3.0*   No results for input(s): LIPASE, AMYLASE in the last 168 hours. No results for input(s): AMMONIA in the last 168 hours. Coagulation Profile: No results for input(s): INR, PROTIME in the last 168 hours. Cardiac Enzymes: No results for input(s): CKTOTAL, CKMB, CKMBINDEX, TROPONINI in the last 168 hours. BNP (last 3 results) No results for input(s): PROBNP in the last 8760 hours. HbA1C: No results for input(s): HGBA1C in the last 72 hours. CBG: Recent Labs  Lab 07/10/17 1247  GLUCAP 112*   Lipid Profile: No results for input(s): CHOL, HDL, LDLCALC, TRIG, CHOLHDL, LDLDIRECT in the last 72 hours. Thyroid Function Tests: No results for input(s): TSH, T4TOTAL, FREET4, T3FREE, THYROIDAB in the last 72 hours. Anemia Panel: No results for input(s): VITAMINB12, FOLATE, FERRITIN, TIBC, IRON, RETICCTPCT in the last 72  hours. Urine analysis:    Component Value Date/Time   COLORURINE YELLOW 07/10/2017 1249   APPEARANCEUR HAZY (A) 07/10/2017 1249   LABSPEC 1.014 07/10/2017 1249   PHURINE 6.0 07/10/2017 1249   GLUCOSEU NEGATIVE 07/10/2017 1249   HGBUR NEGATIVE 07/10/2017 1249   BILIRUBINUR NEGATIVE 07/10/2017 1249   KETONESUR NEGATIVE 07/10/2017 1249   PROTEINUR 100 (A) 07/10/2017 1249   UROBILINOGEN 0.2 12/04/2008 1414   NITRITE NEGATIVE 07/10/2017 1249   LEUKOCYTESUR NEGATIVE 07/10/2017 1249    Radiological Exams on Admission: Dg Chest 2 View  Result Date: 07/10/2017 CLINICAL DATA:  Shortness of breath EXAM: CHEST - 2 VIEW COMPARISON:  CT 07/10/2017 FINDINGS: Cardiomegaly with vascular congestion and mild pulmonary edema. Small left pleural effusion. Moderate right pleural effusion. Straight edge on lateral view posteriorly suggests possible loculation. No pneumothorax. IMPRESSION: 1. Cardiomegaly with vascular congestion and mild pulmonary edema 2. There are bilateral pleural effusions, small on the left and moderate on the right with possible loculation on the right. Electronically Signed   By: Adrian Prows.D.  On: 07/10/2017 18:43   Ct Abdomen Pelvis W Contrast  Result Date: 07/10/2017 CLINICAL DATA:  47 y/o  M; edema and pain to the testicles. EXAM: CT ABDOMEN AND PELVIS WITH CONTRAST TECHNIQUE: Multidetector CT imaging of the abdomen and pelvis was performed using the standard protocol following bolus administration of intravenous contrast. CONTRAST:  ISOVUE-300 IOPAMIDOL (ISOVUE-300) INJECTION 61% COMPARISON:  12/04/2008 CT abdomen and pelvis FINDINGS: Lower chest: Moderate right and small left pleural effusions. Hepatobiliary: No focal liver lesion. Cholelithiasis. No biliary ductal dilatation. Pancreas: Unremarkable. No pancreatic ductal dilatation or surrounding inflammatory changes. Spleen: Small lucencies within the periphery of the spleen that persists on the delayed phase may  represent small areas of infarction. No other focal splenic lesion identified. Adrenals/Urinary Tract: Adrenal glands are unremarkable. Kidneys are normal, without renal calculi, focal lesion, or hydronephrosis. Bladder is unremarkable. Stomach/Bowel: Left paramedian paraumbilical hernia with broad neck measuring 8.1 x 4.4 cm ML BY CC containing fat and small bowel without proximal obstruction. This hernia is increased in size from prior study. Vascular/Lymphatic: Aortic atherosclerosis. No enlarged abdominal or pelvic lymph nodes. Reproductive: Prostate is unremarkable. Extensive scrotal edema. Anasarca. Other: Small volume of ascites. Musculoskeletal: No fracture is seen. IMPRESSION: 1. Anasarca, scrotal edema, small volume ascites, and bilateral pleural effusions compatible with third spacing. 2. Cholelithiasis. 3. Small peripheral lucencies in the spleen, possible small infarctions. 4. Large paraumbilical hernia containing fat and small bowel without obstruction, increased in size. Electronically Signed   By: Mitzi Hansen M.D.   On: 07/10/2017 19:00   US Scrotum W/doppler  Result Date: 07/10/2017 CLINICAL DATA:  Scrotal swelling and pain.  Pain for 1 day EXAM: SCROTAL ULTRASOUND DOPPLER ULTRASOUND OF THE TESTICLES TECHNIQUE: Complete ultrasound examination of the testicles, epididymis, and other scrotal structures was performed. Color and spectral Doppler ultrasound were also utilized to evaluate blood flow to the testicles. COMPARISON:  None. FINDINGS: Right testicle Measurements: Normal in size and homogeneous echotexture measuring 3.9 by 2.8 x 2.7 cm. No mass or microlithiasis visualized. Left testicle Measurements: Normal in size and homogeneous echotexture measuring 3.8 x 2.7 x 3.4 cm. No mass or microlithiasis visualized. Right epididymis:  Not identified Left epididymis:  But not identified Hydrocele:  None visualized. Varicocele:  None visualized. Pulsed Doppler interrogation of both  testes demonstrates normal low resistance arterial and venous waveforms bilaterally. Other: There is extensive scrotal wall edema involving the LEFT hemiscrotum and RIGHT hemiscrotum IMPRESSION: 1. No evidence of testicular torsion or abscess. Normal vascular flow to the LEFT and RIGHT testicle which are normal size. 2. Extensive edema and thickening of the scrotal wall. Electronically Signed   By: Genevive Bi M.D.   On: 07/10/2017 13:57    EKG: Independently reviewed. EKG order, but not available.  Assessment/Plan Principal Problem:   Volume overload Likely due to to hypoalbuminemia (nephropathy versus liver disease?). However, she consider dilated cardiomyopathy. Observation/telemetry. Supplemental oxygen as needed. Furosemide 80 mg IVP twice a day. Potassium supplementation while on furosemide. Monitor daily weights, intake and output. Monitor renal function and electrolytes. Check echocardiogram in the morning. Check renal ultrasound.  Active Problems:   Hyperbilirubinemia Trend LFTs. Follow-up hepatitis panel ordered in ED.    Hypoalbuminemia Secondary to proteinuria. This might be worsening edema. Check 24-hour protein collection tomorrow. ACE inhibitor started for hypertension and renal protection.    Anemia Check anemia panel. Monitor hematocrit and hemoglobin.    Diabetes mellitus without complication (HCC) Last hemoglobin A1c was 12.3% in 2008. Carbohydrate modified diet. Check  hemoglobin A1c. CBG monitoring 4 times a day while in the hospital. Begin regular insulin sliding scale if glucose starts trending up.    Hypertension Started on furosemide. Start lisinopril 5 mg p.o. daily.    Tinea corporis Lotrimin 1% application twice daily.    DVT prophylaxis: Lovenox SQ. Code Status: Full code. Family Communication:  Disposition Plan: 24-48-hour observation admit  for volume overload workup and treatment. Consults called:  Admission status:  Observation/telemetry.   Bobette Mo MD [facility] Pager [phone].  If 7PM-7AM, please contact night-coverage [email or web address] Password [facility]  07/10/2017, 7:54 PM

## 2017-07-10 NOTE — Progress Notes (Signed)
Pt arrived to 323 via stretcher from ED. Oriented to room and equipment. Telemetry applied. PAS and skin swarm completed with Cat, NA.  BLE noted with 1+ edema, redness, and scattered dry, patchy, scabby areas. C/o 8/10 scrotal pain. MD notified. Norco ordered and given. No needs voiced at this time. Will continue to monitor.

## 2017-07-10 NOTE — ED Provider Notes (Signed)
MSE was initiated and I personally evaluated the patient and placed orders (if any) at  12:54 PM on July 10, 2017.  The patient appears stable so that the remainder of the MSE may be completed by another provider.  Patient with testicle swelling and pain that began this morning.  Reportedly normal yesterday.  No fevers.  Ultrasound and some lab work ordered.  Torsion felt less likely but ultrasound will help determine this.  Scrotum is edematous particularly on the left side.   Benjiman Core, MD 07/10/17 1255

## 2017-07-11 ENCOUNTER — Other Ambulatory Visit: Payer: Self-pay

## 2017-07-11 ENCOUNTER — Other Ambulatory Visit (HOSPITAL_COMMUNITY): Payer: Self-pay

## 2017-07-11 ENCOUNTER — Observation Stay (HOSPITAL_COMMUNITY): Payer: Self-pay

## 2017-07-11 DIAGNOSIS — R0600 Dyspnea, unspecified: Secondary | ICD-10-CM

## 2017-07-11 DIAGNOSIS — E8809 Other disorders of plasma-protein metabolism, not elsewhere classified: Secondary | ICD-10-CM | POA: Diagnosis present

## 2017-07-11 DIAGNOSIS — B354 Tinea corporis: Secondary | ICD-10-CM | POA: Diagnosis present

## 2017-07-11 LAB — CBC
HCT: 38 % — ABNORMAL LOW (ref 39.0–52.0)
Hemoglobin: 11.9 g/dL — ABNORMAL LOW (ref 13.0–17.0)
MCH: 28.5 pg (ref 26.0–34.0)
MCHC: 31.3 g/dL (ref 30.0–36.0)
MCV: 91.1 fL (ref 78.0–100.0)
PLATELETS: 317 10*3/uL (ref 150–400)
RBC: 4.17 MIL/uL — AB (ref 4.22–5.81)
RDW: 15.1 % (ref 11.5–15.5)
WBC: 7 10*3/uL (ref 4.0–10.5)

## 2017-07-11 LAB — HEPATIC FUNCTION PANEL
ALK PHOS: 216 U/L — AB (ref 38–126)
ALT: 14 U/L — ABNORMAL LOW (ref 17–63)
AST: 23 U/L (ref 15–41)
Albumin: 2.8 g/dL — ABNORMAL LOW (ref 3.5–5.0)
BILIRUBIN INDIRECT: 1.5 mg/dL — AB (ref 0.3–0.9)
BILIRUBIN TOTAL: 2.7 mg/dL — AB (ref 0.3–1.2)
Bilirubin, Direct: 1.2 mg/dL — ABNORMAL HIGH (ref 0.1–0.5)
TOTAL PROTEIN: 6.7 g/dL (ref 6.5–8.1)

## 2017-07-11 LAB — BASIC METABOLIC PANEL
Anion gap: 10 (ref 5–15)
BUN: 15 mg/dL (ref 6–20)
CALCIUM: 8.7 mg/dL — AB (ref 8.9–10.3)
CO2: 25 mmol/L (ref 22–32)
CREATININE: 0.89 mg/dL (ref 0.61–1.24)
Chloride: 103 mmol/L (ref 101–111)
GFR calc non Af Amer: >60 mL/min (ref >60)
Glucose, Bld: 99 mg/dL (ref 65–99)
Potassium: 4 mmol/L (ref 3.5–5.1)
SODIUM: 138 mmol/L (ref 135–145)

## 2017-07-11 LAB — GLUCOSE, CAPILLARY
GLUCOSE-CAPILLARY: 117 mg/dL — AB (ref 65–99)
Glucose-Capillary: 92 mg/dL (ref 65–99)
Glucose-Capillary: 117 mg/dL — ABNORMAL HIGH (ref 65–99)

## 2017-07-11 LAB — ECHOCARDIOGRAM COMPLETE
Height: 70 in
WEIGHTICAEL: 5472 [oz_av]

## 2017-07-11 LAB — IRON AND TIBC
Iron: 54 ug/dL (ref 45–182)
Saturation Ratios: 19 % (ref 17.9–39.5)
TIBC: 277 ug/dL (ref 250–450)
UIBC: 223 ug/dL

## 2017-07-11 LAB — HEMOGLOBIN A1C
HEMOGLOBIN A1C: 6.4 % — AB (ref 4.8–5.6)
Mean Plasma Glucose: 136.98 mg/dL

## 2017-07-11 LAB — FOLATE: Folate: 13.6 ng/mL (ref >5.9)

## 2017-07-11 LAB — RETICULOCYTES
RBC.: 4.17 MIL/uL — ABNORMAL LOW (ref 4.22–5.81)
RETIC COUNT ABSOLUTE: 66.7 10*3/uL (ref 19.0–186.0)
RETIC CT PCT: 1.6 % (ref 0.4–3.1)

## 2017-07-11 LAB — FERRITIN: FERRITIN: 86 ng/mL (ref 24–336)

## 2017-07-11 LAB — VITAMIN B12: Vitamin B-12: 561 pg/mL (ref 180–914)

## 2017-07-11 MED ORDER — LISINOPRIL 10 MG PO TABS
10.0000 mg | ORAL_TABLET | Freq: Every day | ORAL | Status: DC
  Filled 2017-07-11: qty 1

## 2017-07-11 MED ORDER — CLOTRIMAZOLE 1 % EX CREA
TOPICAL_CREAM | Freq: Two times a day (BID) | CUTANEOUS | Status: DC
  Administered 2017-07-12 – 2017-07-14 (×5): via TOPICAL
  Administered 2017-07-14: 1 via TOPICAL
  Administered 2017-07-15 – 2017-07-18 (×7): via TOPICAL
  Filled 2017-07-11: qty 15

## 2017-07-11 NOTE — Care Management (Signed)
CM aware of referral. Pt from home, ind with ADL's. Pt has PCP. He is uninsured. He is diabetic but has no DM medications listed on med rec. NO documentation of non-compliance with with medications. Pt will most likely need MATCH voucher. CM will cont to follow and visit with patient once closer to DC.

## 2017-07-11 NOTE — Progress Notes (Signed)
Orders placed for 24 hour urine collection this am. Pt voided independently this am prior to starting urine collection. 24 hour urine collection started at 1100. Discussed with patient the importance of collecting all urine voided during 24 hour period and not to discard any output. Verbalized understanding. Signs placed in room, on bathroom door and above toilet to remind patient and nursing staff of urine collection in process. Pt currently using bedside commode with nuns cap to collect urine since he is unable to void with urinal due to scrotal/perineal edema. Scrotal support obtained from materials department for patient use per orders. Earnstine Regal, RN

## 2017-07-11 NOTE — Progress Notes (Signed)
PROGRESS NOTE    Jeremiah Vance  ZOX:[id number] DOB: [birth year] DOA: 07/10/2017 PCP: Gladstone Pih, MD   Brief Narrative:   Jeremiah Vance is a 47 y.o. male with medical history significant of type 2 diabetes, hypertension, morbid obesity who is coming to the emergency department with complaints of pain and edema of his scrotum that started this morning.   He states that the swelling began quite abruptly 2 days ago.  He appears to have some hypoalbuminemia with protein noted in his urine.  Due to his volume overload he is currently being diuresed with Lasix which he appears to be tolerating well.  24-hour urine protein collection is currently underway to assess for nephrotic syndrome.   Assessment & Plan:   Principal Problem:   Volume overload Active Problems:   Hyperbilirubinemia   Anemia   Diabetes mellitus without complication (HCC)   Hypertension   Hypoalbuminemia   Tinea corporis   1. Volume overload likely secondary to hypoalbuminemia from nephrotic syndrome versus liver disease.  Continue diuresis with Lasix 80 mg IV push twice a day.  Urine 24-hour protein collection.  Further evaluation with acute hepatitis panel pending.  ANA as well as SPEP and UPEP added on for further evaluation.  Discussed with nephrology that this could likely be light chain disease versus minimal change disease secondary to diabetic nephropathy.  Continue with ACE inhibitor.  Renal ultrasound with no acute findings.  2D echocardiogram pending.  Right upper quadrant liver ultrasound with some cholelithiasis and fatty infiltration of liver. 2. Hypoalbuminemia likely related to proteinuria versus liver disease.  Continue monitoring of 24-hour urine to assess further. 3. Hyperbilirubinemia.  Continue to trend LFTs with hepatitis panel pending.  Right upper quadrant ultrasound with no acute findings. 4. Type 2 diabetes.  Hemoglobin A1c pending with prior noted 12.3% in 2008.  Continue sliding scale  insulin. 5. Hypertension.  Continue aggressive diuresis with Lasix.  Lisinopril 5 mg currently and will increase to 10 mg on account of elevated blood pressure readings. 6. Tinea corporis.  Lotrimin 1% application twice daily.   DVT prophylaxis: Lovenox Code Status: Full Family Communication: None Disposition Plan: Treatment of volume overload with diuresis with evaluation for nephrotic syndrome versus liver disease.   Consultants:   None  Procedures:   None  Antimicrobials:   None   Subjective: Patient seen and evaluated today with no new acute complaints or concerns. No acute concerns or events noted overnight. Appears to be diuresing well.  Objective: Vitals:   07/10/17 1930 07/10/17 2000 07/10/17 2242 07/11/17 0654  BP: (!) 193/106 (!) 198/111 (!) 189/92 (!) 169/90  Pulse: 78 79 88 79  Resp:      Temp:   98.3 F (36.8 C) 98.1 F (36.7 C)  TempSrc:   Oral Oral  SpO2: 97% 98% 94% 97%  Weight:   (!) 161.7 kg (356 lb 7.7 oz)   Height:        Intake/Output Summary (Last 24 hours) at 07/11/2017 1030 Last data filed at 07/11/2017 0500 Gross per 24 hour  Intake 720 ml  Output 1600 ml  Net -880 ml   Filed Weights   07/10/17 1245 07/10/17 2242  Weight: (!) 172.4 kg (380 lb) (!) 161.7 kg (356 lb 7.7 oz)    Examination:  General exam: Appears calm and comfortable  Respiratory system: Clear to auscultation. Respiratory effort normal. Cardiovascular system: S1 & S2 heard, RRR. No JVD, murmurs, rubs, gallops or clicks. No pedal edema. Gastrointestinal system: Abdomen  is nondistended, soft and nontender. No organomegaly or masses felt. Normal bowel sounds heard. Central nervous system: Alert and oriented. No focal neurological deficits. Extremities: Symmetric 5 x 5 power. Skin: No rashes, lesions or ulcers Psychiatry: Judgement and insight appear normal. Mood & affect appropriate.     Data Reviewed: I have personally reviewed following labs and imaging  studies  CBC: Recent Labs  Lab 07/10/17 1311 07/11/17 0426  WBC 6.5 7.0  NEUTROABS 4.4  --   HGB 12.4* 11.9*  HCT 39.7 38.0*  MCV 90.6 91.1  PLT 310 317   Basic Metabolic Panel: Recent Labs  Lab 07/10/17 1311 07/11/17 0426  NA 138 138  K 4.1 4.0  CL 104 103  CO2 23 25  GLUCOSE 115* 99  BUN 16 15  CREATININE 0.85 0.89  CALCIUM 8.7* 8.7*  MG 1.8  --    GFR: Estimated Creatinine Clearance: 157.5 mL/min (by C-G formula based on SCr of 0.89 mg/dL). Liver Function Tests: Recent Labs  Lab 07/10/17 1311 07/11/17 0426  AST 22 23  ALT 12* 14*  ALKPHOS 240* 216*  BILITOT 2.5* 2.7*  PROT 7.3 6.7  ALBUMIN 3.0* 2.8*   No results for input(s): LIPASE, AMYLASE in the last 168 hours. No results for input(s): AMMONIA in the last 168 hours. Coagulation Profile: No results for input(s): INR, PROTIME in the last 168 hours. Cardiac Enzymes: No results for input(s): CKTOTAL, CKMB, CKMBINDEX, TROPONINI in the last 168 hours. BNP (last 3 results) No results for input(s): PROBNP in the last 8760 hours. HbA1C: No results for input(s): HGBA1C in the last 72 hours. CBG: Recent Labs  Lab 07/10/17 1247 07/10/17 2300 07/11/17 0744  GLUCAP 112* 97 92   Lipid Profile: No results for input(s): CHOL, HDL, LDLCALC, TRIG, CHOLHDL, LDLDIRECT in the last 72 hours. Thyroid Function Tests: No results for input(s): TSH, T4TOTAL, FREET4, T3FREE, THYROIDAB in the last 72 hours. Anemia Panel: Recent Labs    07/11/17 0426  RETICCTPCT 1.6   Sepsis Labs: No results for input(s): PROCALCITON, LATICACIDVEN in the last 168 hours.  No results found for this or any previous visit (from the past 240 hour(s)).       Radiology Studies: Dg Chest 2 View  Result Date: 07/10/2017 CLINICAL DATA:  Shortness of breath EXAM: CHEST - 2 VIEW COMPARISON:  CT 07/10/2017 FINDINGS: Cardiomegaly with vascular congestion and mild pulmonary edema. Small left pleural effusion. Moderate right pleural  effusion. Straight edge on lateral view posteriorly suggests possible loculation. No pneumothorax. IMPRESSION: 1. Cardiomegaly with vascular congestion and mild pulmonary edema 2. There are bilateral pleural effusions, small on the left and moderate on the right with possible loculation on the right. Electronically Signed   By: Jasmine Pang M.D.   On: 07/10/2017 18:43   Ct Abdomen Pelvis W Contrast  Result Date: 07/10/2017 CLINICAL DATA:  47 y/o  M; edema and pain to the testicles. EXAM: CT ABDOMEN AND PELVIS WITH CONTRAST TECHNIQUE: Multidetector CT imaging of the abdomen and pelvis was performed using the standard protocol following bolus administration of intravenous contrast. CONTRAST:  ISOVUE-300 IOPAMIDOL (ISOVUE-300) INJECTION 61% COMPARISON:  12/04/2008 CT abdomen and pelvis FINDINGS: Lower chest: Moderate right and small left pleural effusions. Hepatobiliary: No focal liver lesion. Cholelithiasis. No biliary ductal dilatation. Pancreas: Unremarkable. No pancreatic ductal dilatation or surrounding inflammatory changes. Spleen: Small lucencies within the periphery of the spleen that persists on the delayed phase may represent small areas of infarction. No other focal splenic lesion identified.  Adrenals/Urinary Tract: Adrenal glands are unremarkable. Kidneys are normal, without renal calculi, focal lesion, or hydronephrosis. Bladder is unremarkable. Stomach/Bowel: Left paramedian paraumbilical hernia with broad neck measuring 8.1 x 4.4 cm ML BY CC containing fat and small bowel without proximal obstruction. This hernia is increased in size from prior study. Vascular/Lymphatic: Aortic atherosclerosis. No enlarged abdominal or pelvic lymph nodes. Reproductive: Prostate is unremarkable. Extensive scrotal edema. Anasarca. Other: Small volume of ascites. Musculoskeletal: No fracture is seen. IMPRESSION: 1. Anasarca, scrotal edema, small volume ascites, and bilateral pleural effusions compatible with  third spacing. 2. Cholelithiasis. 3. Small peripheral lucencies in the spleen, possible small infarctions. 4. Large paraumbilical hernia containing fat and small bowel without obstruction, increased in size. Electronically Signed   By: Mitzi Hansen M.D.   On: 07/10/2017 19:00   US Renal  Result Date: 07/11/2017 CLINICAL DATA:  Proteinuria, diabetes, hypertension, obesity. EXAM: RENAL / URINARY TRACT ULTRASOUND COMPLETE COMPARISON:  None. FINDINGS: Right Kidney: Length: 12.5 cm. Echogenicity within normal limits. No mass or hydronephrosis visualized. Left Kidney: Length: 12 cm. Echogenicity within normal limits. No mass or hydronephrosis visualized. Bladder: Decompressed limiting characterization. IMPRESSION: Kidneys appear normal.  No hydronephrosis. Electronically Signed   By: Bary Richard M.D.   On: 07/11/2017 09:23   US Scrotum W/doppler  Result Date: 07/10/2017 CLINICAL DATA:  Scrotal swelling and pain.  Pain for 1 day EXAM: SCROTAL ULTRASOUND DOPPLER ULTRASOUND OF THE TESTICLES TECHNIQUE: Complete ultrasound examination of the testicles, epididymis, and other scrotal structures was performed. Color and spectral Doppler ultrasound were also utilized to evaluate blood flow to the testicles. COMPARISON:  None. FINDINGS: Right testicle Measurements: Normal in size and homogeneous echotexture measuring 3.9 by 2.8 x 2.7 cm. No mass or microlithiasis visualized. Left testicle Measurements: Normal in size and homogeneous echotexture measuring 3.8 x 2.7 x 3.4 cm. No mass or microlithiasis visualized. Right epididymis:  Not identified Left epididymis:  But not identified Hydrocele:  None visualized. Varicocele:  None visualized. Pulsed Doppler interrogation of both testes demonstrates normal low resistance arterial and venous waveforms bilaterally. Other: There is extensive scrotal wall edema involving the LEFT hemiscrotum and RIGHT hemiscrotum IMPRESSION: 1. No evidence of testicular torsion or  abscess. Normal vascular flow to the LEFT and RIGHT testicle which are normal size. 2. Extensive edema and thickening of the scrotal wall. Electronically Signed   By: Genevive Bi M.D.   On: 07/10/2017 13:57   US Abdomen Limited Ruq  Result Date: 07/11/2017 CLINICAL DATA:  Hyperbilirubinemia. EXAM: ULTRASOUND ABDOMEN LIMITED RIGHT UPPER QUADRANT COMPARISON:  CT 07/10/2017. FINDINGS: Gallbladder: Multiple tiny gallstones noted. Gallstones measure up to 1.6 cm. Gallbladder wall thickness 5 mm. Negative Murphy sign. Common bile duct: Diameter: 3 mm Liver: Increased hepatic echogenicity consistent fatty infiltration and/or pedis cellular disease. Portal vein is patent on color Doppler imaging with normal direction of blood flow towards the liver. Incidental note made of right pleural effusion. IMPRESSION: 1. Cholelithiasis. Mild prominence of the gallbladder wall at 5 mm. Cholecystitis cannot be excluded. Negative Murphy sign. No biliary distention. 2. Increased hepatic echogenicity consistent fatty infiltration and/or hepatocellular disease. No focal hepatic abnormality identified. 3.  Right pleural effusion. Electronically Signed   By: Maisie Fus  Register   On: 07/11/2017 09:16        Scheduled Meds: . clotrimazole   Topical BID  . furosemide  80 mg Intravenous BID  . lisinopril  5 mg Oral Daily  . potassium chloride  20 mEq Oral TID   Continuous Infusions:  LOS: 0 days    Time spent: 30 minutes    Pratik Hoover Brunette, DO [facility] Pager [phone]  If 7PM-7AM, please contact night-coverage [email or web address] Password [email or web address] 07/11/2017, 10:30 AM

## 2017-07-11 NOTE — Clinical Social Work Note (Signed)
Received CSW consult for pt having no insurance. Patient Accounts representative follows up for possible Medicaid eligibility. RN CM will follow for assistance with medications or other needs at dc. Will clear the consult.

## 2017-07-11 NOTE — Progress Notes (Signed)
24 hr urine collection restarted this afternoon at 1430. Previous urine output from voiding discarded by nursing staff. Patient aware to continue 24 hour urine collection as before. Verbalizes understanding. Earnstine Regal, RN

## 2017-07-11 NOTE — Progress Notes (Signed)
  Echocardiogram 2D Echocardiogram has been performed.  Delcie Roch 07/11/2017, 12:18 PM

## 2017-07-12 LAB — BASIC METABOLIC PANEL
ANION GAP: 12 (ref 5–15)
BUN: 18 mg/dL (ref 6–20)
CO2: 27 mmol/L (ref 22–32)
Calcium: 8.7 mg/dL — ABNORMAL LOW (ref 8.9–10.3)
Chloride: 98 mmol/L — ABNORMAL LOW (ref 101–111)
Creatinine, Ser: 1.06 mg/dL (ref 0.61–1.24)
GFR calc Af Amer: >60 mL/min (ref >60)
GLUCOSE: 103 mg/dL — AB (ref 65–99)
POTASSIUM: 3.7 mmol/L (ref 3.5–5.1)
Sodium: 137 mmol/L (ref 135–145)

## 2017-07-12 LAB — HIV ANTIBODY (ROUTINE TESTING W REFLEX): HIV SCREEN 4TH GENERATION: NONREACTIVE

## 2017-07-12 LAB — HEPATIC FUNCTION PANEL
ALBUMIN: 2.9 g/dL — AB (ref 3.5–5.0)
ALT: 13 U/L — AB (ref 17–63)
AST: 22 U/L (ref 15–41)
Alkaline Phosphatase: 201 U/L — ABNORMAL HIGH (ref 38–126)
BILIRUBIN DIRECT: 1 mg/dL — AB (ref 0.1–0.5)
Indirect Bilirubin: 1.4 mg/dL — ABNORMAL HIGH (ref 0.3–0.9)
Total Bilirubin: 2.4 mg/dL — ABNORMAL HIGH (ref 0.3–1.2)
Total Protein: 7.4 g/dL (ref 6.5–8.1)

## 2017-07-12 LAB — HEPATITIS PANEL, ACUTE
HCV Ab: <0.1 s/co ratio (ref 0.0–0.9)
Hep A IgM: NEGATIVE
Hep B C IgM: NEGATIVE
Hepatitis B Surface Ag: NEGATIVE

## 2017-07-12 LAB — ANA W/REFLEX IF POSITIVE: Anti Nuclear Antibody(ANA): NEGATIVE

## 2017-07-12 LAB — GLUCOSE, CAPILLARY
GLUCOSE-CAPILLARY: 111 mg/dL — AB (ref 65–99)
Glucose-Capillary: 108 mg/dL — ABNORMAL HIGH (ref 65–99)
Glucose-Capillary: 117 mg/dL — ABNORMAL HIGH (ref 65–99)
Glucose-Capillary: 136 mg/dL — ABNORMAL HIGH (ref 65–99)

## 2017-07-12 LAB — PROTEIN, URINE, 24 HOUR
COLLECTION INTERVAL-UPROT: 24 h
Protein, 24H Urine: 927 mg/d — ABNORMAL HIGH (ref 50–100)
Protein, Urine: 18 mg/dL
URINE TOTAL VOLUME-UPROT: 5150 mL

## 2017-07-12 MED ORDER — HYDRALAZINE HCL 20 MG/ML IJ SOLN: 10.0000 mg | INTRAMUSCULAR | Status: DC | PRN

## 2017-07-12 MED ORDER — LISINOPRIL 10 MG PO TABS
20.0000 mg | ORAL_TABLET | Freq: Every day | ORAL | Status: DC
  Administered 2017-07-12 – 2017-07-18 (×7): 20 mg via ORAL
  Filled 2017-07-12 (×7): qty 2

## 2017-07-12 NOTE — Progress Notes (Signed)
PROGRESS NOTE    Jeremiah Vance  OMV:[id number] DOB: Oct 21, 1970 DOA: 07/10/2017 PCP: Gladstone Pih, MD   Brief Narrative:   Jeremiah Vance is a 47 y.o. male with medical history significant of type 2 diabetes, hypertension, morbid obesity who is coming to the emergency department with complaints of pain and edema of his scrotum that started this morning.   He states that the swelling began quite abruptly 2 days ago.  He appears to have some hypoalbuminemia with protein noted in his urine.  Due to his volume overload he is currently being diuresed with Lasix which he appears to be tolerating well.  24-hour urine protein collection is currently underway to assess for nephrotic syndrome.   Assessment & Plan:   Principal Problem:   Volume overload Active Problems:   Hyperbilirubinemia   Anemia   Diabetes mellitus without complication (HCC)   Hypertension   Hypoalbuminemia   Tinea corporis   1. Volume overload likely secondary to hypoalbuminemia from nephrotic syndrome versus liver disease.  Continue diuresis with Lasix 80 mg IV push twice a day.  Urine 24-hour protein collection pending.  Acute hepatitis panel negative.  ANA as well as SPEP and UPEP added on for further evaluation.  Discussed with nephrology that this could likely be light chain disease versus minimal change disease secondary to diabetic nephropathy.  Continue with ACE inhibitor.  Renal ultrasound with no acute findings.  Right upper quadrant liver ultrasound with some cholelithiasis and fatty infiltration of liver. 2. Hypoalbuminemia likely related to proteinuria versus liver disease.  Continue monitoring of 24-hour urine to assess further. Albumin level has remained stable. 3. Hyperbilirubinemia.  Continue to trend LFTs with hepatitis panel pending.  Right upper quadrant ultrasound with no acute findings. This currently remains stable. 4. Moderate LVH with EF 40-45%. Follow up with Cardiology outpatient and maintain on  ACE-I. 5. Type 2 diabetes.  Hemoglobin A1c pending with prior noted 12.3% in 2008.  Continue sliding scale insulin. 6. Hypertension.  Continue aggressive diuresis with Lasix.  Lisinopril 10mg  currently and will increase to 20mg . Add hydralazine prn for coverage. 7. Tinea corporis.  Lotrimin 1% application twice daily.   DVT prophylaxis: Lovenox Code Status: Full Family Communication: None Disposition Plan: Treatment of volume overload with diuresis with evaluation for nephrotic syndrome versus liver disease.   Consultants:   None  Procedures:   None  Antimicrobials:   None   Subjective: Patient seen and evaluated today with no new acute complaints or concerns. No acute concerns or events noted overnight. Appears to be diuresing well and is overall feeling better this morning. He has had a BM.  Objective: Vitals:   07/11/17 1031 07/11/17 1529 07/11/17 2100 07/12/17 0500  BP:  (!) 171/88 (!) 169/93 (!) 167/101  Pulse:  71 67 75  Resp:  18 18 18   Temp:  98.1 F (36.7 C) 98 F (36.7 C) 99.3 F (37.4 C)  TempSrc:  Oral Oral Oral  SpO2:  98% 100% 96%  Weight: (!) 155.1 kg (342 lb)   (!) 156.8 kg (345 lb 10.9 oz)  Height:        Intake/Output Summary (Last 24 hours) at 07/12/2017 0952 Last data filed at 07/12/2017 0600 Gross per 24 hour  Intake 600 ml  Output 5300 ml  Net -4700 ml   Filed Weights   07/10/17 2242 07/11/17 1031 07/12/17 0500  Weight: (!) 161.7 kg (356 lb 7.7 oz) (!) 155.1 kg (342 lb) (!) 156.8 kg (345 lb 10.9  oz)    Examination:  General exam: Appears calm and comfortable  Respiratory system: Clear to auscultation. Respiratory effort normal. Cardiovascular system: S1 & S2 heard, RRR. No JVD, murmurs, rubs, gallops or clicks. Significant edema throughout that is stable. Gastrointestinal system: Abdomen is nondistended, soft and nontender. No organomegaly or masses felt. Normal bowel sounds heard. Central nervous system: Alert and oriented. No focal  neurological deficits. Extremities: Symmetric 5 x 5 power. Skin: No rashes, lesions or ulcers Psychiatry: Judgement and insight appear normal. Mood & affect appropriate.     Data Reviewed: I have personally reviewed following labs and imaging studies  CBC: Recent Labs  Lab 07/10/17 1311 07/11/17 0426  WBC 6.5 7.0  NEUTROABS 4.4  --   HGB 12.4* 11.9*  HCT 39.7 38.0*  MCV 90.6 91.1  PLT 310 317   Basic Metabolic Panel: Recent Labs  Lab 07/10/17 1311 07/11/17 0426 07/12/17 0624  NA 138 138 137  K 4.1 4.0 3.7  CL 104 103 98*  CO2 23 25 27   GLUCOSE 115* 99 103*  BUN 16 15 18   CREATININE 0.85 0.89 1.06  CALCIUM 8.7* 8.7* 8.7*  MG 1.8  --   --    GFR: Estimated Creatinine Clearance: 129.8 mL/min (by C-G formula based on SCr of 1.06 mg/dL). Liver Function Tests: Recent Labs  Lab 07/10/17 1311 07/11/17 0426 07/12/17 0624  AST 22 23 22   ALT 12* 14* 13*  ALKPHOS 240* 216* 201*  BILITOT 2.5* 2.7* 2.4*  PROT 7.3 6.7 7.4  ALBUMIN 3.0* 2.8* 2.9*   No results for input(s): LIPASE, AMYLASE in the last 168 hours. No results for input(s): AMMONIA in the last 168 hours. Coagulation Profile: No results for input(s): INR, PROTIME in the last 168 hours. Cardiac Enzymes: No results for input(s): CKTOTAL, CKMB, CKMBINDEX, TROPONINI in the last 168 hours. BNP (last 3 results) No results for input(s): PROBNP in the last 8760 hours. HbA1C: Recent Labs    07/11/17 0426  HGBA1C 6.4*   CBG: Recent Labs  Lab 07/10/17 2300 07/11/17 0744 07/11/17 1121 07/11/17 2039 07/12/17 0740  GLUCAP 97 92 117* 117* 108*   Lipid Profile: No results for input(s): CHOL, HDL, LDLCALC, TRIG, CHOLHDL, LDLDIRECT in the last 72 hours. Thyroid Function Tests: No results for input(s): TSH, T4TOTAL, FREET4, T3FREE, THYROIDAB in the last 72 hours. Anemia Panel: Recent Labs    07/11/17 0426  VITAMINB12 561  FOLATE 13.6  FERRITIN 86  TIBC 277  IRON 54  RETICCTPCT 1.6   Sepsis Labs: No  results for input(s): PROCALCITON, LATICACIDVEN in the last 168 hours.  No results found for this or any previous visit (from the past 240 hour(s)).       Radiology Studies: Dg Chest 2 View  Result Date: 07/10/2017 CLINICAL DATA:  Shortness of breath EXAM: CHEST - 2 VIEW COMPARISON:  CT 07/10/2017 FINDINGS: Cardiomegaly with vascular congestion and mild pulmonary edema. Small left pleural effusion. Moderate right pleural effusion. Straight edge on lateral view posteriorly suggests possible loculation. No pneumothorax. IMPRESSION: 1. Cardiomegaly with vascular congestion and mild pulmonary edema 2. There are bilateral pleural effusions, small on the left and moderate on the right with possible loculation on the right. Electronically Signed   By: Jasmine Pang M.D.   On: 07/10/2017 18:43   Ct Abdomen Pelvis W Contrast  Result Date: 07/10/2017 CLINICAL DATA:  47 y/o  M; edema and pain to the testicles. EXAM: CT ABDOMEN AND PELVIS WITH CONTRAST TECHNIQUE: Multidetector CT imaging of  the abdomen and pelvis was performed using the standard protocol following bolus administration of intravenous contrast. CONTRAST:  ISOVUE-300 IOPAMIDOL (ISOVUE-300) INJECTION 61% COMPARISON:  12/04/2008 CT abdomen and pelvis FINDINGS: Lower chest: Moderate right and small left pleural effusions. Hepatobiliary: No focal liver lesion. Cholelithiasis. No biliary ductal dilatation. Pancreas: Unremarkable. No pancreatic ductal dilatation or surrounding inflammatory changes. Spleen: Small lucencies within the periphery of the spleen that persists on the delayed phase may represent small areas of infarction. No other focal splenic lesion identified. Adrenals/Urinary Tract: Adrenal glands are unremarkable. Kidneys are normal, without renal calculi, focal lesion, or hydronephrosis. Bladder is unremarkable. Stomach/Bowel: Left paramedian paraumbilical hernia with broad neck measuring 8.1 x 4.4 cm ML BY CC containing fat and small  bowel without proximal obstruction. This hernia is increased in size from prior study. Vascular/Lymphatic: Aortic atherosclerosis. No enlarged abdominal or pelvic lymph nodes. Reproductive: Prostate is unremarkable. Extensive scrotal edema. Anasarca. Other: Small volume of ascites. Musculoskeletal: No fracture is seen. IMPRESSION: 1. Anasarca, scrotal edema, small volume ascites, and bilateral pleural effusions compatible with third spacing. 2. Cholelithiasis. 3. Small peripheral lucencies in the spleen, possible small infarctions. 4. Large paraumbilical hernia containing fat and small bowel without obstruction, increased in size. Electronically Signed   By: Mitzi Hansen M.D.   On: 07/10/2017 19:00   US Renal  Result Date: 07/11/2017 CLINICAL DATA:  Proteinuria, diabetes, hypertension, obesity. EXAM: RENAL / URINARY TRACT ULTRASOUND COMPLETE COMPARISON:  None. FINDINGS: Right Kidney: Length: 12.5 cm. Echogenicity within normal limits. No mass or hydronephrosis visualized. Left Kidney: Length: 12 cm. Echogenicity within normal limits. No mass or hydronephrosis visualized. Bladder: Decompressed limiting characterization. IMPRESSION: Kidneys appear normal.  No hydronephrosis. Electronically Signed   By: Bary Richard M.D.   On: 07/11/2017 09:23   US Scrotum W/doppler  Result Date: 07/10/2017 CLINICAL DATA:  Scrotal swelling and pain.  Pain for 1 day EXAM: SCROTAL ULTRASOUND DOPPLER ULTRASOUND OF THE TESTICLES TECHNIQUE: Complete ultrasound examination of the testicles, epididymis, and other scrotal structures was performed. Color and spectral Doppler ultrasound were also utilized to evaluate blood flow to the testicles. COMPARISON:  None. FINDINGS: Right testicle Measurements: Normal in size and homogeneous echotexture measuring 3.9 by 2.8 x 2.7 cm. No mass or microlithiasis visualized. Left testicle Measurements: Normal in size and homogeneous echotexture measuring 3.8 x 2.7 x 3.4 cm. No mass or  microlithiasis visualized. Right epididymis:  Not identified Left epididymis:  But not identified Hydrocele:  None visualized. Varicocele:  None visualized. Pulsed Doppler interrogation of both testes demonstrates normal low resistance arterial and venous waveforms bilaterally. Other: There is extensive scrotal wall edema involving the LEFT hemiscrotum and RIGHT hemiscrotum IMPRESSION: 1. No evidence of testicular torsion or abscess. Normal vascular flow to the LEFT and RIGHT testicle which are normal size. 2. Extensive edema and thickening of the scrotal wall. Electronically Signed   By: Genevive Bi M.D.   On: 07/10/2017 13:57   US Abdomen Limited Ruq  Result Date: 07/11/2017 CLINICAL DATA:  Hyperbilirubinemia. EXAM: ULTRASOUND ABDOMEN LIMITED RIGHT UPPER QUADRANT COMPARISON:  CT 07/10/2017. FINDINGS: Gallbladder: Multiple tiny gallstones noted. Gallstones measure up to 1.6 cm. Gallbladder wall thickness 5 mm. Negative Murphy sign. Common bile duct: Diameter: 3 mm Liver: Increased hepatic echogenicity consistent fatty infiltration and/or pedis cellular disease. Portal vein is patent on color Doppler imaging with normal direction of blood flow towards the liver. Incidental note made of right pleural effusion. IMPRESSION: 1. Cholelithiasis. Mild prominence of the gallbladder wall at 5 mm.  Cholecystitis cannot be excluded. Negative Murphy sign. No biliary distention. 2. Increased hepatic echogenicity consistent fatty infiltration and/or hepatocellular disease. No focal hepatic abnormality identified. 3.  Right pleural effusion. Electronically Signed   By: Maisie Fus  Register   On: 07/11/2017 09:16        Scheduled Meds: . clotrimazole   Topical BID  . furosemide  80 mg Intravenous BID  . lisinopril  10 mg Oral Daily  . potassium chloride  20 mEq Oral TID   Continuous Infusions:   LOS: 1 day    Time spent: 30 minutes    Bexton Haak Hoover Brunette, DO [facility] Pager [phone]  If 7PM-7AM,  please contact night-coverage [email or web address] Password [facility] 07/12/2017, 9:52 AM

## 2017-07-13 LAB — HEPATIC FUNCTION PANEL
ALT: 12 U/L — ABNORMAL LOW (ref 17–63)
AST: 19 U/L (ref 15–41)
Albumin: 2.9 g/dL — ABNORMAL LOW (ref 3.5–5.0)
Alkaline Phosphatase: 178 U/L — ABNORMAL HIGH (ref 38–126)
BILIRUBIN INDIRECT: 1.3 mg/dL — AB (ref 0.3–0.9)
Bilirubin, Direct: 0.9 mg/dL — ABNORMAL HIGH (ref 0.1–0.5)
TOTAL PROTEIN: 7.1 g/dL (ref 6.5–8.1)
Total Bilirubin: 2.2 mg/dL — ABNORMAL HIGH (ref 0.3–1.2)

## 2017-07-13 LAB — GLUCOSE, CAPILLARY
GLUCOSE-CAPILLARY: 110 mg/dL — AB (ref 65–99)
Glucose-Capillary: 94 mg/dL (ref 65–99)
Glucose-Capillary: 109 mg/dL — ABNORMAL HIGH (ref 65–99)
Glucose-Capillary: 134 mg/dL — ABNORMAL HIGH (ref 65–99)

## 2017-07-13 LAB — BASIC METABOLIC PANEL
Anion gap: 11 (ref 5–15)
BUN: 21 mg/dL — AB (ref 6–20)
CO2: 32 mmol/L (ref 22–32)
CREATININE: 1.23 mg/dL (ref 0.61–1.24)
Calcium: 9 mg/dL (ref 8.9–10.3)
Chloride: 98 mmol/L — ABNORMAL LOW (ref 101–111)
GFR calc Af Amer: >60 mL/min (ref >60)
Glucose, Bld: 91 mg/dL (ref 65–99)
Potassium: 4 mmol/L (ref 3.5–5.1)
SODIUM: 141 mmol/L (ref 135–145)

## 2017-07-13 LAB — MAGNESIUM: MAGNESIUM: 2 mg/dL (ref 1.7–2.4)

## 2017-07-13 NOTE — Progress Notes (Signed)
PROGRESS NOTE    Jeremiah Vance  ZOX:[id number] DOB: 09-15-1970 DOA: 07/10/2017 PCP: Gladstone Pih, MD   Brief Narrative:   Jeremiah Vance is a 47 y.o. male with medical history significant of type 2 diabetes, hypertension, morbid obesity who is coming to the emergency department with complaints of pain and edema of his scrotum that started this morning.   He states that the swelling began quite abruptly 2 days ago.  He appears to have some hypoalbuminemia with protein noted in his urine.  Due to his volume overload he is currently being diuresed with Lasix which he appears to be tolerating well.  24-hour urine protein collection did not demonstrate clear nephrotic syndrome. He is feeling better each day with aggressive diuresis.  Assessment & Plan:   Principal Problem:   Volume overload Active Problems:   Hyperbilirubinemia   Anemia   Diabetes mellitus without complication (HCC)   Hypertension   Hypoalbuminemia   Tinea corporis   1. Volume overload with hypoalbuminemia likely multifactorial.  Continue diuresis with Lasix 80 mg IV push twice a day.  Urine 24-hour protein without nephrotic range proteinuria.  Acute hepatitis panel negative.  ANA negative and SPEP and UPEP added on for further evaluation.  Discussed with nephrology who will assist and consult with patient. 2. Hypoalbuminemia likely related to proteinuria versus liver disease.  Continue monitoring of 24-hour urine to assess further. Albumin level has remained stable. 3. Hyperbilirubinemia.  Continue to trend LFTs with hepatitis panel pending.  Right upper quadrant ultrasound with no acute findings. This currently remains stable. 4. Moderate LVH with EF 40-45%. Follow up with Cardiology outpatient and maintain on ACE-I. 5. Type 2 diabetes.  Hemoglobin A1c pending with prior noted 12.3% in 2008.  Continue sliding scale insulin. 6. Hypertension.  Continue aggressive diuresis with Lasix.  Continue lisinopril 20 mg daily with  hydralazine as needed for coverage.  Blood pressures improved. 7. Tinea corporis.  Lotrimin 1% application twice daily.   DVT prophylaxis: Lovenox Code Status: Full Family Communication: None Disposition Plan: Treatment of volume overload with diuresis with evaluation for nephrotic syndrome versus liver disease.   Consultants:   None  Procedures:   None  Antimicrobials:   None   Subjective: Patient seen and evaluated today with no new acute complaints or concerns. No acute concerns or events noted overnight. Appears to be diuresing well and is overall feeling better this morning.  Objective: Vitals:   07/12/17 1020 07/12/17 1423 07/12/17 2116 07/13/17 0528  BP: (!) 147/76 (!) 155/91 (!) 162/79 (!) 164/90  Pulse: 65 74 65 67  Resp:  18 20 15   Temp:  97.9 F (36.6 C) 97.7 F (36.5 C) 98.3 F (36.8 C)  TempSrc:  Oral Oral Oral  SpO2:  96% 98% 96%  Weight:    (!) 144.9 kg (319 lb 8 oz)  Height:        Intake/Output Summary (Last 24 hours) at 07/13/2017 0959 Last data filed at 07/13/2017 0535 Gross per 24 hour  Intake 480 ml  Output 5000 ml  Net -4520 ml   Filed Weights   07/11/17 1031 07/12/17 0500 07/13/17 0528  Weight: (!) 155.1 kg (342 lb) (!) 156.8 kg (345 lb 10.9 oz) (!) 144.9 kg (319 lb 8 oz)    Examination:  General exam: Appears calm and comfortable  Respiratory system: Clear to auscultation. Respiratory effort normal. Cardiovascular system: S1 & S2 heard, RRR. No JVD, murmurs, rubs, gallops or clicks. Significant edema throughout that is  stable. Gastrointestinal system: Abdomen is nondistended, soft and nontender. No organomegaly or masses felt. Normal bowel sounds heard. Central nervous system: Alert and oriented. No focal neurological deficits. Extremities: Symmetric 5 x 5 power. Skin: No rashes, lesions or ulcers Psychiatry: Judgement and insight appear normal. Mood & affect appropriate.     Data Reviewed: I have personally reviewed following  labs and imaging studies  CBC: Recent Labs  Lab 07/10/17 1311 07/11/17 0426  WBC 6.5 7.0  NEUTROABS 4.4  --   HGB 12.4* 11.9*  HCT 39.7 38.0*  MCV 90.6 91.1  PLT 310 317   Basic Metabolic Panel: Recent Labs  Lab 07/10/17 1311 07/11/17 0426 07/12/17 0624 07/13/17 0635  NA 138 138 137 141  K 4.1 4.0 3.7 4.0  CL 104 103 98* 98*  CO2 23 25 27  32  GLUCOSE 115* 99 103* 91  BUN 16 15 18  21*  CREATININE 0.85 0.89 1.06 1.23  CALCIUM 8.7* 8.7* 8.7* 9.0  MG 1.8  --   --  2.0   GFR: Estimated Creatinine Clearance: 106.9 mL/min (by C-G formula based on SCr of 1.23 mg/dL). Liver Function Tests: Recent Labs  Lab 07/10/17 1311 07/11/17 0426 07/12/17 0624 07/13/17 0635  AST 22 23 22 19   ALT 12* 14* 13* 12*  ALKPHOS 240* 216* 201* 178*  BILITOT 2.5* 2.7* 2.4* 2.2*  PROT 7.3 6.7 7.4 7.1  ALBUMIN 3.0* 2.8* 2.9* 2.9*   No results for input(s): LIPASE, AMYLASE in the last 168 hours. No results for input(s): AMMONIA in the last 168 hours. Coagulation Profile: No results for input(s): INR, PROTIME in the last 168 hours. Cardiac Enzymes: No results for input(s): CKTOTAL, CKMB, CKMBINDEX, TROPONINI in the last 168 hours. BNP (last 3 results) No results for input(s): PROBNP in the last 8760 hours. HbA1C: Recent Labs    07/11/17 0426  HGBA1C 6.4*   CBG: Recent Labs  Lab 07/12/17 0740 07/12/17 1119 07/12/17 1617 07/12/17 2108 07/13/17 0734  GLUCAP 108* 111* 117* 136* 94   Lipid Profile: No results for input(s): CHOL, HDL, LDLCALC, TRIG, CHOLHDL, LDLDIRECT in the last 72 hours. Thyroid Function Tests: No results for input(s): TSH, T4TOTAL, FREET4, T3FREE, THYROIDAB in the last 72 hours. Anemia Panel: Recent Labs    07/11/17 0426  VITAMINB12 561  FOLATE 13.6  FERRITIN 86  TIBC 277  IRON 54  RETICCTPCT 1.6   Sepsis Labs: No results for input(s): PROCALCITON, LATICACIDVEN in the last 168 hours.  No results found for this or any previous visit (from the past 240  hour(s)).       Radiology Studies: No results found.      Scheduled Meds: . clotrimazole   Topical BID  . furosemide  80 mg Intravenous BID  . lisinopril  20 mg Oral Daily  . potassium chloride  20 mEq Oral TID   Continuous Infusions:   LOS: 2 days    Time spent: 30 minutes    Honora Searson Hoover Brunette, DO [facility] Pager [phone]  If 7PM-7AM, please contact night-coverage [email or web address] Password [email or web address] 07/13/2017, 9:59 AM

## 2017-07-14 LAB — BASIC METABOLIC PANEL
ANION GAP: 10 (ref 5–15)
BUN: 23 mg/dL — ABNORMAL HIGH (ref 6–20)
CALCIUM: 8.6 mg/dL — AB (ref 8.9–10.3)
CO2: 32 mmol/L (ref 22–32)
CREATININE: 1.25 mg/dL — AB (ref 0.61–1.24)
Chloride: 93 mmol/L — ABNORMAL LOW (ref 101–111)
Glucose, Bld: 94 mg/dL (ref 65–99)
Potassium: 3.7 mmol/L (ref 3.5–5.1)
SODIUM: 135 mmol/L (ref 135–145)

## 2017-07-14 LAB — GLUCOSE, CAPILLARY
GLUCOSE-CAPILLARY: 112 mg/dL — AB (ref 65–99)
GLUCOSE-CAPILLARY: 96 mg/dL (ref 65–99)
GLUCOSE-CAPILLARY: 119 mg/dL — AB (ref 65–99)
GLUCOSE-CAPILLARY: 124 mg/dL — AB (ref 65–99)
Glucose-Capillary: 138 mg/dL — ABNORMAL HIGH (ref 65–99)

## 2017-07-14 LAB — PROTEIN ELECTROPHORESIS, SERUM
A/G RATIO SPE: 0.7 (ref 0.7–1.7)
ALBUMIN ELP: 2.7 g/dL — AB (ref 2.9–4.4)
ALPHA-1-GLOBULIN: 0.3 g/dL (ref 0.0–0.4)
ALPHA-2-GLOBULIN: 0.6 g/dL (ref 0.4–1.0)
BETA GLOBULIN: 1.1 g/dL (ref 0.7–1.3)
GLOBULIN, TOTAL: 3.9 g/dL (ref 2.2–3.9)
Gamma Globulin: 1.9 g/dL — ABNORMAL HIGH (ref 0.4–1.8)
Total Protein ELP: 6.6 g/dL (ref 6.0–8.5)

## 2017-07-14 LAB — HEPATIC FUNCTION PANEL
ALBUMIN: 2.9 g/dL — AB (ref 3.5–5.0)
ALT: 12 U/L — ABNORMAL LOW (ref 17–63)
AST: 18 U/L (ref 15–41)
Alkaline Phosphatase: 190 U/L — ABNORMAL HIGH (ref 38–126)
Bilirubin, Direct: 0.8 mg/dL — ABNORMAL HIGH (ref 0.1–0.5)
Indirect Bilirubin: 1.2 mg/dL — ABNORMAL HIGH (ref 0.3–0.9)
TOTAL PROTEIN: 7.1 g/dL (ref 6.5–8.1)
Total Bilirubin: 2 mg/dL — ABNORMAL HIGH (ref 0.3–1.2)

## 2017-07-14 NOTE — Progress Notes (Signed)
PROGRESS NOTE    Jeremiah Vance  WUJ:[id number] DOB: [birth year] DOA: 07/10/2017 PCP: Gladstone Pih, MD   Brief Narrative:   Jeremiah Vance is a 47 y.o. male with medical history significant of type 2 diabetes, hypertension, morbid obesity who is coming to the emergency department with complaints of pain and edema of his scrotum that started this morning.   Jeremiah Vance states that the swelling began quite abruptly 2 days ago.  Jeremiah Vance appears to have some hypoalbuminemia with protein noted in his urine.  Due to his volume overload Jeremiah Vance is currently being diuresed with Lasix which Jeremiah Vance appears to be tolerating well.  24-hour urine protein collection did not demonstrate clear nephrotic syndrome. Jeremiah Vance is feeling better each day with aggressive diuresis.  Assessment & Plan:   Principal Problem:   Volume overload Active Problems:   Hyperbilirubinemia   Anemia   Diabetes mellitus without complication (HCC)   Hypertension   Hypoalbuminemia   Tinea corporis   1. Volume overload with hypoalbuminemia likely multifactorial.  Continue diuresis with Lasix 80 mg IV push twice a day.  Urine 24-hour protein without nephrotic range proteinuria.  Acute hepatitis panel negative.  ANA negative and SPEP and UPEP added on for further evaluation.  Discussed with nephrology who feels that the current management is appropriate.  Jeremiah Vance has actually diuresed close to 8 L of fluid yesterday. 2. Hypoalbuminemia likely related to mild proteinuria and/or mild liver disease.  Continue to monitor.  This has remained stable. 3. Hyperbilirubinemia.  Continue to trend LFTs with hepatitis panel negative.  Right upper quadrant ultrasound with no acute findings. This currently remains stable. 4. Moderate LVH with EF 40-45%. Follow up with Cardiology outpatient and maintain on ACE-I. 5. Type 2 diabetes.  Hemoglobin A1c pending with prior noted 12.3% in 2008.  Continue sliding scale insulin. 6. Hypertension.  Continue aggressive diuresis with Lasix.   Continue lisinopril 20 mg daily with hydralazine as needed for coverage.  Blood pressures improved. 7. Tinea corporis.  Lotrimin 1% application twice daily.   DVT prophylaxis: Lovenox Code Status: Full Family Communication: None Disposition Plan: Treatment of volume overload with diuresis with evaluation for nephrotic syndrome versus liver disease.   Consultants:   None  Procedures:   None  Antimicrobials:   None   Subjective: Jeremiah Vance seen and evaluated today with no new acute complaints or concerns. No acute concerns or events noted overnight. Appears to be diuresing well and is overall feeling better this morning.  Objective: Vitals:   07/13/17 0528 07/13/17 1516 07/13/17 2124 07/14/17 0516  BP: (!) 164/90 (!) 150/79 (!) 151/91 (!) 159/83  Pulse: 67 73 (!) 58 65  Resp: 15 18 18 16   Temp: 98.3 F (36.8 C) 97.8 F (36.6 C) 97.8 F (36.6 C) 98.1 F (36.7 C)  TempSrc: Oral Oral Oral Oral  SpO2: 96% 98% 97% 96%  Weight: (!) 144.9 kg (319 lb 8 oz)   (!) 143.5 kg (316 lb 6.4 oz)  Height:        Intake/Output Summary (Last 24 hours) at 07/14/2017 1028 Last data filed at 07/14/2017 0530 Gross per 24 hour  Intake 960 ml  Output 9000 ml  Net -8040 ml   Filed Weights   07/12/17 0500 07/13/17 0528 07/14/17 0516  Weight: (!) 156.8 kg (345 lb 10.9 oz) (!) 144.9 kg (319 lb 8 oz) (!) 143.5 kg (316 lb 6.4 oz)    Examination:  General exam: Appears calm and comfortable  Respiratory system: Clear to auscultation.  Respiratory effort normal. Cardiovascular system: S1 & S2 heard, RRR. No JVD, murmurs, rubs, gallops or clicks. Significant edema throughout that is stable. Gastrointestinal system: Abdomen is nondistended, soft and nontender. No organomegaly or masses felt. Normal bowel sounds heard. Central nervous system: Alert and oriented. No focal neurological deficits. Extremities: Symmetric 5 x 5 power.  Bilateral edema improving. Skin: No rashes, lesions or  ulcers Psychiatry: Judgement and insight appear normal. Mood & affect appropriate.     Data Reviewed: I have personally reviewed following labs and imaging studies  CBC: Recent Labs  Lab 07/10/17 1311 07/11/17 0426  WBC 6.5 7.0  NEUTROABS 4.4  --   HGB 12.4* 11.9*  HCT 39.7 38.0*  MCV 90.6 91.1  PLT 310 317   Basic Metabolic Panel: Recent Labs  Lab 07/10/17 1311 07/11/17 0426 07/12/17 0624 07/13/17 0635 07/14/17 0544  NA 138 138 137 141 135  K 4.1 4.0 3.7 4.0 3.7  CL 104 103 98* 98* 93*  CO2 23 25 27  32 32  GLUCOSE 115* 99 103* 91 94  BUN 16 15 18  21* 23*  CREATININE 0.85 0.89 1.06 1.23 1.25*  CALCIUM 8.7* 8.7* 8.7* 9.0 8.6*  MG 1.8  --   --  2.0  --    GFR: Estimated Creatinine Clearance: 104.6 mL/min (A) (by C-G formula based on SCr of 1.25 mg/dL (H)). Liver Function Tests: Recent Labs  Lab 07/10/17 1311 07/11/17 0426 07/12/17 0624 07/13/17 0635 07/14/17 0544  AST 22 23 22 19 18   ALT 12* 14* 13* 12* 12*  ALKPHOS 240* 216* 201* 178* 190*  BILITOT 2.5* 2.7* 2.4* 2.2* 2.0*  PROT 7.3 6.7 7.4 7.1 7.1  ALBUMIN 3.0* 2.8* 2.9* 2.9* 2.9*   No results for input(s): LIPASE, AMYLASE in the last 168 hours. No results for input(s): AMMONIA in the last 168 hours. Coagulation Profile: No results for input(s): INR, PROTIME in the last 168 hours. Cardiac Enzymes: No results for input(s): CKTOTAL, CKMB, CKMBINDEX, TROPONINI in the last 168 hours. BNP (last 3 results) No results for input(s): PROBNP in the last 8760 hours. HbA1C: No results for input(s): HGBA1C in the last 72 hours. CBG: Recent Labs  Lab 07/13/17 0734 07/13/17 1126 07/13/17 1608 07/13/17 2120 07/14/17 0746  GLUCAP 94 109* 134* 110* 96   Lipid Profile: No results for input(s): CHOL, HDL, LDLCALC, TRIG, CHOLHDL, LDLDIRECT in the last 72 hours. Thyroid Function Tests: No results for input(s): TSH, T4TOTAL, FREET4, T3FREE, THYROIDAB in the last 72 hours. Anemia Panel: No results for input(s):  VITAMINB12, FOLATE, FERRITIN, TIBC, IRON, RETICCTPCT in the last 72 hours. Sepsis Labs: No results for input(s): PROCALCITON, LATICACIDVEN in the last 168 hours.  No results found for this or any previous visit (from the past 240 hour(s)).    Radiology Studies: No results found.    Scheduled Meds: . clotrimazole   Topical BID  . furosemide  80 mg Intravenous BID  . lisinopril  20 mg Oral Daily  . potassium chloride  20 mEq Oral TID   Continuous Infusions:   LOS: 3 days    Time spent: 30 minutes    Wynn Kernes Hoover Brunette, DO [facility] Pager [phone]  If 7PM-7AM, please contact night-coverage [email or web address] Password [facility] 07/14/2017, 10:28 AM

## 2017-07-15 LAB — BASIC METABOLIC PANEL
Anion gap: 12 (ref 5–15)
BUN: 28 mg/dL — AB (ref 6–20)
CHLORIDE: 92 mmol/L — AB (ref 101–111)
CO2: 32 mmol/L (ref 22–32)
CREATININE: 1.28 mg/dL — AB (ref 0.61–1.24)
Calcium: 8.7 mg/dL — ABNORMAL LOW (ref 8.9–10.3)
GFR calc Af Amer: >60 mL/min (ref >60)
GFR calc non Af Amer: >60 mL/min (ref >60)
GLUCOSE: 106 mg/dL — AB (ref 65–99)
POTASSIUM: 4.1 mmol/L (ref 3.5–5.1)
SODIUM: 136 mmol/L (ref 135–145)

## 2017-07-15 LAB — HEPATIC FUNCTION PANEL
ALT: 12 U/L — ABNORMAL LOW (ref 17–63)
AST: 18 U/L (ref 15–41)
Albumin: 2.9 g/dL — ABNORMAL LOW (ref 3.5–5.0)
Alkaline Phosphatase: 179 U/L — ABNORMAL HIGH (ref 38–126)
Bilirubin, Direct: 0.8 mg/dL — ABNORMAL HIGH (ref 0.1–0.5)
Indirect Bilirubin: 1.2 mg/dL — ABNORMAL HIGH (ref 0.3–0.9)
Total Bilirubin: 2 mg/dL — ABNORMAL HIGH (ref 0.3–1.2)
Total Protein: 7 g/dL (ref 6.5–8.1)

## 2017-07-15 LAB — UPEP/UIFE/LIGHT CHAINS/TP, 24-HR UR
% BETA, Urine: 9.1 %
ALPHA 1 URINE: 4.5 %
ALPHA 2 UR: 3.6 %
Albumin, U: 70.9 %
FREE KAPPA LT CHAINS, UR: 16.4 mg/L (ref 1.35–24.19)
FREE KAPPA/LAMBDA RATIO: 6.51 (ref 2.04–10.37)
FREE LAMBDA LT CHAINS, UR: 2.52 mg/L (ref 0.24–6.66)
GAMMA GLOBULIN URINE: 12 %
TOTAL PROTEIN, URINE-UR/DAY: 1251 mg/(24.h) — AB (ref 30–150)
TOTAL VOLUME: 5150
Total Protein, Urine: 24.3 mg/dL

## 2017-07-15 LAB — GLUCOSE, CAPILLARY
GLUCOSE-CAPILLARY: 125 mg/dL — AB (ref 65–99)
Glucose-Capillary: 97 mg/dL (ref 65–99)
Glucose-Capillary: 105 mg/dL — ABNORMAL HIGH (ref 65–99)
Glucose-Capillary: 95 mg/dL (ref 65–99)

## 2017-07-15 NOTE — Progress Notes (Signed)
PROGRESS NOTE    Jeremiah Vance  ZOX:[id number] DOB: 08-May-1970 DOA: 07/10/2017 PCP: Gladstone Pih, MD   Brief Narrative:   Jeremiah Vance is a 47 y.o. male with medical history significant of type 2 diabetes, hypertension, morbid obesity who is coming to the emergency department with complaints of pain and edema of his scrotum that started this morning.   He states that the swelling began quite abruptly 2 days ago.  He appears to have some hypoalbuminemia with protein noted in his urine.  Due to his volume overload he is currently being diuresed with Lasix which he appears to be tolerating well.  24-hour urine protein collection did not demonstrate clear nephrotic syndrome. He is feeling better each day with aggressive diuresis that is ongoing with 7-8L of fluid diuresed each day.  Assessment & Plan:   Principal Problem:   Volume overload Active Problems:   Hyperbilirubinemia   Anemia   Diabetes mellitus without complication (HCC)   Hypertension   Hypoalbuminemia   Tinea corporis   1. Volume overload with hypoalbuminemia likely multifactorial.  Continue ongoing diuresis with Lasix 80 mg IV push twice a day.  Urine 24-hour protein without nephrotic range proteinuria.  Acute hepatitis panel negative.  ANA negative as well as SPEP. Discussed with nephrology who feels that the current management is appropriate.  He has actually diuresed close to 8 L of fluid yesterday. Renal panel in am. 2. Hypoalbuminemia likely related to mild proteinuria and/or mild liver disease.  Continue to monitor.  This has remained stable. 3. Hyperbilirubinemia.  Hepatitis panel negative no further need to trend LFTs as these have been stable.  Right upper quadrant ultrasound with no acute findings.  4. Moderate LVH with EF 40-45%. Follow up with Cardiology outpatient and maintain on ACE-I. 5. Type 2 diabetes.  Hemoglobin A1c pending with prior noted 12.3% in 2008.  Continue sliding scale insulin. 6. Hypertension.   Continue aggressive diuresis with Lasix.  Continue lisinopril 20 mg daily with hydralazine as needed for coverage.  Blood pressures improved. 7. Tinea corporis.  Lotrimin 1% application twice daily.   DVT prophylaxis: Lovenox Code Status: Full Family Communication: None Disposition Plan: Treatment of volume overload with diuresis with evaluation for nephrotic syndrome versus liver disease.   Consultants:   None  Procedures:   None  Antimicrobials:   None   Subjective: Patient seen and evaluated today with no new acute complaints or concerns. No acute concerns or events noted overnight. Appears to be diuresing well and is overall feeling better this morning.  Objective: Vitals:   07/14/17 1540 07/14/17 2016 07/15/17 0503 07/15/17 0506  BP: 131/90 (!) 155/78 136/73   Pulse: 68 63 64   Resp: 20     Temp: 98.1 F (36.7 C) (!) 97.5 F (36.4 C) 97.7 F (36.5 C)   TempSrc: Oral Oral Oral   SpO2: 97% 99% 97%   Weight:    (!) 137.3 kg (302 lb 11.1 oz)  Height:        Intake/Output Summary (Last 24 hours) at 07/15/2017 0919 Last data filed at 07/15/2017 0900 Gross per 24 hour  Intake 1080 ml  Output 8700 ml  Net -7620 ml   Filed Weights   07/13/17 0528 07/14/17 0516 07/15/17 0506  Weight: (!) 144.9 kg (319 lb 8 oz) (!) 143.5 kg (316 lb 6.4 oz) (!) 137.3 kg (302 lb 11.1 oz)    Examination:  General exam: Appears calm and comfortable  Respiratory system: Clear to auscultation.  Respiratory effort normal. Cardiovascular system: S1 & S2 heard, RRR. No JVD, murmurs, rubs, gallops or clicks. Significant edema throughout that is stable. Gastrointestinal system: Abdomen is nondistended, soft and nontender. No organomegaly or masses felt. Normal bowel sounds heard. Central nervous system: Alert and oriented. No focal neurological deficits. Extremities: Symmetric 5 x 5 power.  Bilateral edema improving. Skin: No rashes, lesions or ulcers Psychiatry: Judgement and insight  appear normal. Mood & affect appropriate.     Data Reviewed: I have personally reviewed following labs and imaging studies  CBC: Recent Labs  Lab 07/10/17 1311 07/11/17 0426  WBC 6.5 7.0  NEUTROABS 4.4  --   HGB 12.4* 11.9*  HCT 39.7 38.0*  MCV 90.6 91.1  PLT 310 317   Basic Metabolic Panel: Recent Labs  Lab 07/10/17 1311 07/11/17 0426 07/12/17 0624 07/13/17 0635 07/14/17 0544 07/15/17 0550  NA 138 138 137 141 135 136  K 4.1 4.0 3.7 4.0 3.7 4.1  CL 104 103 98* 98* 93* 92*  CO2 23 25 27  32 32 32  GLUCOSE 115* 99 103* 91 94 106*  BUN 16 15 18  21* 23* 28*  CREATININE 0.85 0.89 1.06 1.23 1.25* 1.28*  CALCIUM 8.7* 8.7* 8.7* 9.0 8.6* 8.7*  MG 1.8  --   --  2.0  --   --    GFR: Estimated Creatinine Clearance: 99.6 mL/min (A) (by C-G formula based on SCr of 1.28 mg/dL (H)). Liver Function Tests: Recent Labs  Lab 07/11/17 0426 07/12/17 0624 07/13/17 0635 07/14/17 0544 07/15/17 0550  AST 23 22 19 18 18   ALT 14* 13* 12* 12* 12*  ALKPHOS 216* 201* 178* 190* 179*  BILITOT 2.7* 2.4* 2.2* 2.0* 2.0*  PROT 6.7 7.4 7.1 7.1 7.0  ALBUMIN 2.8* 2.9* 2.9* 2.9* 2.9*   No results for input(s): LIPASE, AMYLASE in the last 168 hours. No results for input(s): AMMONIA in the last 168 hours. Coagulation Profile: No results for input(s): INR, PROTIME in the last 168 hours. Cardiac Enzymes: No results for input(s): CKTOTAL, CKMB, CKMBINDEX, TROPONINI in the last 168 hours. BNP (last 3 results) No results for input(s): PROBNP in the last 8760 hours. HbA1C: No results for input(s): HGBA1C in the last 72 hours. CBG: Recent Labs  Lab 07/14/17 0746 07/14/17 1203 07/14/17 1656 07/14/17 2102 07/15/17 0757  GLUCAP 96 119* 124* 138* 97   Lipid Profile: No results for input(s): CHOL, HDL, LDLCALC, TRIG, CHOLHDL, LDLDIRECT in the last 72 hours. Thyroid Function Tests: No results for input(s): TSH, T4TOTAL, FREET4, T3FREE, THYROIDAB in the last 72 hours. Anemia Panel: No results  for input(s): VITAMINB12, FOLATE, FERRITIN, TIBC, IRON, RETICCTPCT in the last 72 hours. Sepsis Labs: No results for input(s): PROCALCITON, LATICACIDVEN in the last 168 hours.  No results found for this or any previous visit (from the past 240 hour(s)).    Radiology Studies: No results found.    Scheduled Meds: . clotrimazole   Topical BID  . furosemide  80 mg Intravenous BID  . lisinopril  20 mg Oral Daily  . potassium chloride  20 mEq Oral TID   Continuous Infusions:   LOS: 4 days    Time spent: 30 minutes    Frimet Durfee Hoover Brunette, DO [facility] Pager [phone]  If 7PM-7AM, please contact night-coverage [email or web address] Password [facility] 07/15/2017, 9:19 AM

## 2017-07-15 NOTE — Care Management Note (Signed)
Case Management Note  Patient Details  Name: JAKARION HANISH MRN: [id number] Date of Birth: 11/03/1970  If discussed at Long Length of Stay Meetings, dates discussed:  07/15/17  Additional Comments:  Malcolm Metro, RN 07/15/2017, 11:52 AM

## 2017-07-16 DIAGNOSIS — E119 Type 2 diabetes mellitus without complications: Secondary | ICD-10-CM

## 2017-07-16 LAB — HEPATIC FUNCTION PANEL
ALBUMIN: 3.3 g/dL — AB (ref 3.5–5.0)
ALT: 13 U/L — ABNORMAL LOW (ref 17–63)
AST: 20 U/L (ref 15–41)
Alkaline Phosphatase: 203 U/L — ABNORMAL HIGH (ref 38–126)
Bilirubin, Direct: 0.8 mg/dL — ABNORMAL HIGH (ref 0.1–0.5)
Indirect Bilirubin: 1.2 mg/dL — ABNORMAL HIGH (ref 0.3–0.9)
TOTAL PROTEIN: 8 g/dL (ref 6.5–8.1)
Total Bilirubin: 2 mg/dL — ABNORMAL HIGH (ref 0.3–1.2)

## 2017-07-16 LAB — BASIC METABOLIC PANEL
ANION GAP: 10 (ref 5–15)
BUN: 33 mg/dL — ABNORMAL HIGH (ref 6–20)
CALCIUM: 8.9 mg/dL (ref 8.9–10.3)
CO2: 34 mmol/L — AB (ref 22–32)
Chloride: 92 mmol/L — ABNORMAL LOW (ref 101–111)
Creatinine, Ser: 1.24 mg/dL (ref 0.61–1.24)
GFR calc non Af Amer: >60 mL/min (ref >60)
Glucose, Bld: 100 mg/dL — ABNORMAL HIGH (ref 65–99)
POTASSIUM: 4.9 mmol/L (ref 3.5–5.1)
Sodium: 136 mmol/L (ref 135–145)

## 2017-07-16 LAB — GLUCOSE, CAPILLARY
GLUCOSE-CAPILLARY: 138 mg/dL — AB (ref 65–99)
GLUCOSE-CAPILLARY: 122 mg/dL — AB (ref 65–99)
Glucose-Capillary: 69 mg/dL (ref 65–99)
Glucose-Capillary: 132 mg/dL — ABNORMAL HIGH (ref 65–99)

## 2017-07-16 NOTE — Care Management (Signed)
CM working with pt on finding f/u care. He falls just above the 200% of poverty level for household income. CM has called and gotten prices for [facility] in [place], [facility], [facility], [facility], [facility], [facility] and the [facility]. CM provided prices to patient. Pt will discuss with wife and CM will check back tomorrow.

## 2017-07-16 NOTE — Progress Notes (Signed)
PROGRESS NOTE    Jeremiah Vance  ZOX:[id number] DOB: September 24, 1970 DOA: 07/10/2017 PCP: Gladstone Pih, MD   Brief Narrative:   Jeremiah Vance is a 47 y.o. male with medical history significant of type 2 diabetes, hypertension, morbid obesity who is coming to the emergency department with complaints of pain and edema of his scrotum that started this morning.   He states that the swelling began quite abruptly 2 days ago.  He appears to have some hypoalbuminemia with protein noted in his urine.  Due to his volume overload he is currently being diuresed with Lasix which he appears to be tolerating well.  24-hour urine protein collection did not demonstrate clear nephrotic syndrome. He is feeling better each day with aggressive diuresis that is ongoing with 7-8L of fluid diuresed each day.  Assessment & Plan:  1. Acute CHF: EF 40-45 %. Patient instructed to follow low sodium diet and to be compliant with meds. continue IV diuresis. Still with fluid overload on exam. Having good urine output. Planning to add b-blocker once acute exacerbation resolved. 2. Volume overload with hypoalbuminemia and CHF as mentioned above. Continue IV lasix. Urine 24-hour protein without nephrotic range proteinuria.  Acute hepatitis panel negative.  ANA negative as well as SPEP. Discussed with nephrology who feels that the current management is appropriate.  Follow renal panel. 3. Obesity: Body mass index is 43.12 kg/m. low calorie diet and exercise discussed with patient. 4. Hyperbilirubinemia.  Hepatitis panel negative no further need to trend LFTs as these have been stable and back to normal. Most likely associated with passive congestion from CHF. 5. Type 2 diabetes.  Follow Hemoglobin A1c pending with prior noted 12.3% in 2008.  Continue sliding scale insulin. 6. Hypertension.  Continue aggressive diuresis with Lasix.  Continue lisinopril 20 mg daily with hydralazine as needed for coverage.  Blood pressures improved/stable.    7. Tinea corporis.  Lotrimin 1% application twice daily. Stable/improving. Patient instructed to avoid. scratching    DVT prophylaxis: Lovenox Code Status: Full Family Communication: None Disposition Plan: continue IV lasix, follow daily weights and strict intake and output. Still with fluid overload.   Consultants:   None  Procedures:   None  Antimicrobials:   None   Subjective: Afebrile, no CP. Reported breathing continue improving.   Objective: Vitals:   07/15/17 1422 07/15/17 1953 07/16/17 0632 07/16/17 1326  BP: 125/69 (!) 141/78 138/74 (!) 143/69  Pulse: (!) 55 (!) 57 (!) 56 (!) 59  Resp: 20   18  Temp: 98.4 F (36.9 C) 97.8 F (36.6 C) 97.9 F (36.6 C) 97.8 F (36.6 C)  TempSrc: Oral Oral Oral Oral  SpO2: 98% 100% 98% 98%  Weight:   (!) 136.3 kg (300 lb 7.8 oz)   Height:        Intake/Output Summary (Last 24 hours) at 07/16/2017 1847 Last data filed at 07/16/2017 1828 Gross per 24 hour  Intake 920 ml  Output 9650 ml  Net -8730 ml   Filed Weights   07/14/17 0516 07/15/17 0506 07/16/17 8119  Weight: (!) 143.5 kg (316 lb 6.4 oz) (!) 137.3 kg (302 lb 11.1 oz) (!) 136.3 kg (300 lb 7.8 oz)    Examination:  General exam: no fever, no CP, denies nausea and vomiting. Breathing easier.  Respiratory system: fine crackles at the bases, no using accessory muscles. Cardiovascular system: S1 and S2, no rubs, no gallops, no murmur. Mild JVD at the bases appreciated. 3+ LE edema bilaterally appreciated. Gastrointestinal system:  soft, NT, ND, positive BS Central nervous system: AAOX3, no focal deifcit appreciated, CN intact  Extremities: 3++ edema, no cyanosis, no clubbing. Psychiatry: good insight and judgement, no hallucinations; stable mood.   Data Reviewed: I have personally reviewed following labs and imaging studies  CBC: Recent Labs  Lab 07/10/17 1311 07/11/17 0426  WBC 6.5 7.0  NEUTROABS 4.4  --   HGB 12.4* 11.9*  HCT 39.7 38.0*  MCV 90.6 91.1   PLT 310 317   Basic Metabolic Panel: Recent Labs  Lab 07/10/17 1311  07/12/17 0624 07/13/17 0635 07/14/17 0544 07/15/17 0550 07/16/17 0523  NA 138   < > 137 141 135 136 136  K 4.1   < > 3.7 4.0 3.7 4.1 4.9  CL 104   < > 98* 98* 93* 92* 92*  CO2 23   < > 27 32 32 32 34*  GLUCOSE 115*   < > 103* 91 94 106* 100*  BUN 16   < > 18 21* 23* 28* 33*  CREATININE 0.85   < > 1.06 1.23 1.25* 1.28* 1.24  CALCIUM 8.7*   < > 8.7* 9.0 8.6* 8.7* 8.9  MG 1.8  --   --  2.0  --   --   --    < > = values in this interval not displayed.   GFR: Estimated Creatinine Clearance: 102.4 mL/min (by C-G formula based on SCr of 1.24 mg/dL). Liver Function Tests: Recent Labs  Lab 07/12/17 0624 07/13/17 0635 07/14/17 0544 07/15/17 0550 07/16/17 0523  AST 22 19 18 18 20   ALT 13* 12* 12* 12* 13*  ALKPHOS 201* 178* 190* 179* 203*  BILITOT 2.4* 2.2* 2.0* 2.0* 2.0*  PROT 7.4 7.1 7.1 7.0 8.0  ALBUMIN 2.9* 2.9* 2.9* 2.9* 3.3*   CBG: Recent Labs  Lab 07/15/17 1644 07/15/17 2145 07/16/17 0800 07/16/17 1108 07/16/17 1605  GLUCAP 105* 95 69 138* 122*    No results found for this or any previous visit (from the past 240 hour(s)).    Radiology Studies: No results found.    Scheduled Meds: . clotrimazole   Topical BID  . furosemide  80 mg Intravenous BID  . lisinopril  20 mg Oral Daily  . potassium chloride  20 mEq Oral TID   Continuous Infusions:   LOS: 5 days    Time spent: 30 minutes    Vassie Loll, MD [facility] Pager [phone]  If 7PM-7AM, please contact night-coverage [email or web address] Password [facility] 07/16/2017, 6:47 PM

## 2017-07-17 LAB — BASIC METABOLIC PANEL
Anion gap: 10 (ref 5–15)
BUN: 36 mg/dL — ABNORMAL HIGH (ref 6–20)
CHLORIDE: 91 mmol/L — AB (ref 101–111)
CO2: 34 mmol/L — ABNORMAL HIGH (ref 22–32)
Calcium: 8.9 mg/dL (ref 8.9–10.3)
Creatinine, Ser: 1.39 mg/dL — ABNORMAL HIGH (ref 0.61–1.24)
GFR calc non Af Amer: 59 mL/min — ABNORMAL LOW (ref >60)
Glucose, Bld: 108 mg/dL — ABNORMAL HIGH (ref 65–99)
POTASSIUM: 4.4 mmol/L (ref 3.5–5.1)
SODIUM: 135 mmol/L (ref 135–145)

## 2017-07-17 LAB — HEPATIC FUNCTION PANEL
ALBUMIN: 3 g/dL — AB (ref 3.5–5.0)
ALT: 12 U/L — ABNORMAL LOW (ref 17–63)
AST: 19 U/L (ref 15–41)
Alkaline Phosphatase: 197 U/L — ABNORMAL HIGH (ref 38–126)
Bilirubin, Direct: 0.8 mg/dL — ABNORMAL HIGH (ref 0.1–0.5)
Indirect Bilirubin: 1.5 mg/dL — ABNORMAL HIGH (ref 0.3–0.9)
Total Bilirubin: 2.3 mg/dL — ABNORMAL HIGH (ref 0.3–1.2)
Total Protein: 7.6 g/dL (ref 6.5–8.1)

## 2017-07-17 LAB — GLUCOSE, CAPILLARY
GLUCOSE-CAPILLARY: 89 mg/dL (ref 65–99)
GLUCOSE-CAPILLARY: 148 mg/dL — AB (ref 65–99)
GLUCOSE-CAPILLARY: 109 mg/dL — AB (ref 65–99)
Glucose-Capillary: 117 mg/dL — ABNORMAL HIGH (ref 65–99)

## 2017-07-17 NOTE — Progress Notes (Signed)
PROGRESS NOTE    Jeremiah Vance  LMB:[id number] DOB: March 16, 1971 DOA: 07/10/2017 PCP: Gladstone Pih, MD   Brief Narrative:   Jeremiah Vance is a 47 y.o. male with medical history significant of type 2 diabetes, hypertension, morbid obesity who is coming to the emergency department with complaints of pain and edema of his scrotum that started this morning.   He states that the swelling began quite abruptly 2 days ago.  He appears to have some hypoalbuminemia with protein noted in his urine.  Due to his volume overload he is currently being diuresed with Lasix which he appears to be tolerating well.  24-hour urine protein collection did not demonstrate clear nephrotic syndrome. He is feeling better each day with aggressive diuresis that is ongoing with 7-8L of fluid diuresed each day.  Assessment & Plan:  1. Acute CHF: EF 40-45 %. Patient instructed to follow low sodium diet and to be compliant with meds. Will continue IV diuretics.  Continue to have signs of fluid overload on exam. Having good urine output.  Patient with a roughly negative balance of almost 40 L since admission.  Planning to add b-blocker once acute exacerbation resolved.  Patient will need outpatient follow-up with cardiology service. 2. Volume overload with hypoalbuminemia and CHF as mentioned above. Continue IV lasix. Urine 24-hour protein without nephrotic range proteinuria.  Acute hepatitis panel negative.  ANA negative as well as SPEP. Discussed with nephrology who feels that the current management is appropriate.  Follow renal panel will continue aggressive diuresis.. 3. Obesity: Body mass index is 41.48 kg/m. discussion about low calorie diet and exercise once again provided. 4. Hyperbilirubinemia.  Hepatitis panel negative no further need to trend LFTs as these have been stable and back to normal. Most likely associated with passive congestion from CHF. Continue to check LFTs intermittently. 5. Type 2 diabetes.  A1C 6.4 During  this admission. Prior records reported 12.3% in 2008.  Continue sliding scale insulin while inpatient. Encouraged to continue modified carb diet and will resume home hypoglycemic regimen at discharge.  6. Hypertension.  Continue aggressive diuresis with Lasix.  Continue lisinopril 20 mg daily with hydralazine as needed for coverage.  Blood pressures improved/stable. Will monitor VS. 7. Tinea corporis.  Lotrimin 1% application twice daily. Stable/improving. Patient instructed to avoid. scratching    DVT prophylaxis: Lovenox Code Status: Full Family Communication: None Disposition Plan: continue IV lasix, follow daily weights and strict intake and output. Still with fluid overload.  Patient encouraged to increase physical activity.  Consultants:   None  Procedures:   None  Antimicrobials:   None   Subjective: No fever, no chest pain, no nausea, no vomiting, no abdominal pain.  Patient reports significant improvement in the swelling of his legs and express no shortness of breath at this moment.   Objective: Vitals:   07/16/17 2022 07/16/17 2200 07/17/17 0549 07/17/17 1459  BP:  (!) 151/72 133/69 129/63  Pulse:  71 61 66  Resp:  18 16 16   Temp:  98.3 F (36.8 C) 97.7 F (36.5 C) 97.8 F (36.6 C)  TempSrc:  Oral Oral Oral  SpO2: 96% 98% 97% 100%  Weight:   131.1 kg (289 lb 1.6 oz)   Height:        Intake/Output Summary (Last 24 hours) at 07/17/2017 1726 Last data filed at 07/17/2017 1506 Gross per 24 hour  Intake 960 ml  Output 6000 ml  Net -5040 ml   Filed Weights   07/15/17 0506  07/16/17 1610 07/17/17 0549  Weight: (!) 137.3 kg (302 lb 11.1 oz) (!) 136.3 kg (300 lb 7.8 oz) 131.1 kg (289 lb 1.6 oz)    Examination:  General exam: Afebrile, no chest pain, no nausea or vomiting.  Reports significant improvement in his breathing and in fact denies shortness of breath feeling currently.  Still with some fluid overload appreciated in his torso and lower extremities.     Respiratory system: Fine crackles at the bases, no using accessory muscles, no wheezing, good air movement bilaterally.  Cardiovascular system: S1 and S2, no rubs, no gallops, no murmurs.  No frank JVD appreciated.  2-3+ lower extremity edema bilaterally.   Gastrointestinal system: Obese, soft, nontender, positive bowel sounds, no distention or guarding appreciated.   Central nervous system: Alert, awake and oriented x3, no focal deficits appreciated, cranial nerves grossly intact.  Extremities: No cyanosis, no clubbing, 2-3+ edema bilaterally. Psychiatry: Good judgment and insight, no hallucinations; stable mood.  Data Reviewed: I have personally reviewed following labs and imaging studies  CBC: Recent Labs  Lab 07/11/17 0426  WBC 7.0  HGB 11.9*  HCT 38.0*  MCV 91.1  PLT 317   Basic Metabolic Panel: Recent Labs  Lab 07/13/17 0635 07/14/17 0544 07/15/17 0550 07/16/17 0523 07/17/17 0423  NA 141 135 136 136 135  K 4.0 3.7 4.1 4.9 4.4  CL 98* 93* 92* 92* 91*  CO2 32 32 32 34* 34*  GLUCOSE 91 94 106* 100* 108*  BUN 21* 23* 28* 33* 36*  CREATININE 1.23 1.25* 1.28* 1.24 1.39*  CALCIUM 9.0 8.6* 8.7* 8.9 8.9  MG 2.0  --   --   --   --    GFR: Estimated Creatinine Clearance: 89.4 mL/min (A) (by C-G formula based on SCr of 1.39 mg/dL (H)). Liver Function Tests: Recent Labs  Lab 07/13/17 0635 07/14/17 0544 07/15/17 0550 07/16/17 0523 07/17/17 0423  AST 19 18 18 20 19   ALT 12* 12* 12* 13* 12*  ALKPHOS 178* 190* 179* 203* 197*  BILITOT 2.2* 2.0* 2.0* 2.0* 2.3*  PROT 7.1 7.1 7.0 8.0 7.6  ALBUMIN 2.9* 2.9* 2.9* 3.3* 3.0*   CBG: Recent Labs  Lab 07/16/17 1108 07/16/17 1605 07/16/17 2200 07/17/17 0711 07/17/17 1113  GLUCAP 138* 122* 132* 89 148*    No results found for this or any previous visit (from the past 240 hour(s)).    Radiology Studies: No results found.    Scheduled Meds: . clotrimazole   Topical BID  . furosemide  80 mg Intravenous BID  .  lisinopril  20 mg Oral Daily  . potassium chloride  20 mEq Oral TID   Continuous Infusions:   LOS: 6 days    Time spent: 30 minutes    Vassie Loll, MD [facility] Pager [phone]  If 7PM-7AM, please contact night-coverage [email or web address] Password [facility] 07/17/2017, 5:26 PM

## 2017-07-17 NOTE — Care Management Note (Addendum)
Case Management Note  Patient Details  Name: Jeremiah Vance MRN: [id number] Date of Birth: 23-Sep-1970  If discussed at [place] Length of Stay Meetings, dates discussed:  07/17/17  Additional Comments: Met with patient to further discuss f/u options at DC. Pt has not discussed with his wife. Tomorrow being a holiday, if pt discharges from hospital before next week, pt will have to make his own appointment. CM will provide summary of all alternatives so that pt may follow up after DC.   Sherald Barge, RN 07/17/2017, 12:10 PM

## 2017-07-18 DIAGNOSIS — R945 Abnormal results of liver function studies: Secondary | ICD-10-CM

## 2017-07-18 DIAGNOSIS — I5030 Unspecified diastolic (congestive) heart failure: Secondary | ICD-10-CM

## 2017-07-18 DIAGNOSIS — B354 Tinea corporis: Secondary | ICD-10-CM

## 2017-07-18 DIAGNOSIS — E1121 Type 2 diabetes mellitus with diabetic nephropathy: Secondary | ICD-10-CM

## 2017-07-18 DIAGNOSIS — I10 Essential (primary) hypertension: Secondary | ICD-10-CM

## 2017-07-18 DIAGNOSIS — I5021 Acute systolic (congestive) heart failure: Secondary | ICD-10-CM

## 2017-07-18 DIAGNOSIS — R601 Generalized edema: Secondary | ICD-10-CM

## 2017-07-18 DIAGNOSIS — R7989 Other specified abnormal findings of blood chemistry: Secondary | ICD-10-CM

## 2017-07-18 LAB — COMPREHENSIVE METABOLIC PANEL
ALBUMIN: 3 g/dL — AB (ref 3.5–5.0)
ALT: 12 U/L — AB (ref 17–63)
AST: 19 U/L (ref 15–41)
Alkaline Phosphatase: 190 U/L — ABNORMAL HIGH (ref 38–126)
Anion gap: 12 (ref 5–15)
BUN: 38 mg/dL — AB (ref 6–20)
CHLORIDE: 90 mmol/L — AB (ref 101–111)
CO2: 33 mmol/L — AB (ref 22–32)
CREATININE: 1.47 mg/dL — AB (ref 0.61–1.24)
Calcium: 8.7 mg/dL — ABNORMAL LOW (ref 8.9–10.3)
GFR calc Af Amer: >60 mL/min (ref >60)
GFR, EST NON AFRICAN AMERICAN: 55 mL/min — AB (ref >60)
GLUCOSE: 92 mg/dL (ref 65–99)
POTASSIUM: 4.5 mmol/L (ref 3.5–5.1)
Sodium: 135 mmol/L (ref 135–145)
Total Bilirubin: 2.2 mg/dL — ABNORMAL HIGH (ref 0.3–1.2)
Total Protein: 7.5 g/dL (ref 6.5–8.1)

## 2017-07-18 LAB — CBC
HEMATOCRIT: 42.4 % (ref 39.0–52.0)
Hemoglobin: 13.5 g/dL (ref 13.0–17.0)
MCH: 28.6 pg (ref 26.0–34.0)
MCHC: 31.8 g/dL (ref 30.0–36.0)
MCV: 89.8 fL (ref 78.0–100.0)
PLATELETS: 271 10*3/uL (ref 150–400)
RBC: 4.72 MIL/uL (ref 4.22–5.81)
RDW: 14.5 % (ref 11.5–15.5)
WBC: 8.7 10*3/uL (ref 4.0–10.5)

## 2017-07-18 LAB — GLUCOSE, CAPILLARY
GLUCOSE-CAPILLARY: 105 mg/dL — AB (ref 65–99)
Glucose-Capillary: 93 mg/dL (ref 65–99)
Glucose-Capillary: 126 mg/dL — ABNORMAL HIGH (ref 65–99)

## 2017-07-18 MED ORDER — TORSEMIDE 20 MG PO TABS: 40.0000 mg | ORAL_TABLET | Freq: Every day | ORAL | 3 refills | Status: DC

## 2017-07-18 MED ORDER — TORSEMIDE 20 MG PO TABS
40.0000 mg | ORAL_TABLET | Freq: Every day | ORAL | Status: DC
  Administered 2017-07-18: 40 mg via ORAL
  Filled 2017-07-18: qty 2

## 2017-07-18 MED ORDER — BLOOD GLUCOSE MONITOR KIT: PACK | 0 refills | Status: DC

## 2017-07-18 MED ORDER — SPIRONOLACTONE 25 MG PO TABS
25.0000 mg | ORAL_TABLET | Freq: Every day | ORAL | Status: DC
  Administered 2017-07-18: 25 mg via ORAL
  Filled 2017-07-18: qty 1

## 2017-07-18 MED ORDER — METFORMIN HCL 500 MG PO TABS: 500.0000 mg | ORAL_TABLET | Freq: Two times a day (BID) | ORAL | 2 refills | Status: DC

## 2017-07-18 MED ORDER — SPIRONOLACTONE 25 MG PO TABS: 25.0000 mg | ORAL_TABLET | Freq: Every day | ORAL | 3 refills | Status: DC

## 2017-07-18 MED ORDER — LISINOPRIL 20 MG PO TABS: 20.0000 mg | ORAL_TABLET | Freq: Every day | ORAL | 3 refills | Status: DC

## 2017-07-18 MED ORDER — CLOTRIMAZOLE 1 % EX CREA: TOPICAL_CREAM | Freq: Two times a day (BID) | CUTANEOUS | 0 refills | Status: DC

## 2017-07-18 MED ORDER — IBUPROFEN 200 MG PO TABS: 200.0000 mg | ORAL_TABLET | Freq: Three times a day (TID) | ORAL | Status: DC | PRN

## 2017-07-18 MED ORDER — CARVEDILOL 6.25 MG PO TABS: 6.2500 mg | ORAL_TABLET | Freq: Two times a day (BID) | ORAL | 3 refills | Status: DC

## 2017-07-18 NOTE — Consult Note (Signed)
Reason for Consult: Renal failure and anasarca Referring Physician: Dr. Vilma Meckel Jeremiah Vance is an 47 y.o. male.  HPI: He is a patient was long-standing history of diabetes for more than 15 years, hypertension, morbid obesity presently came with complaints of increased leg swelling, difficulty breathing.  When he was evaluated he was found to have signs of significant fluid overload.  Patient was managed with diuretics.  However his creatinine was also elevated hence consult is called.  Presently patient denies any previous history of renal failure, kidney stone, proteinuria.  However patient has significant leg edema for many years.  Recently however it has become significant.  Patient is now being followed by primary care physician for the last couple of years.  Presently he states that he is feeling much better and lost significant weight.  His appetite is good and no difficulty breathing.  Past Medical History:  Diagnosis Date  . Diabetes mellitus without complication ([place])   . Hypertension   . Morbid obesity ([place])     Past Surgical History:  Procedure Laterality Date  . HERNIA REPAIR      Family History  Problem Relation Age of Onset  . Diabetes Father   . Diabetes Sister     Social History:  reports that he has never smoked. He has never used smokeless tobacco. He reports that he does not drink alcohol or use drugs.  Allergies: No Known Allergies  Medications: I have reviewed the patient'Vance current medications.  Results for orders placed or performed during the hospital encounter of 07/10/17 (from the past 48 hour(Vance))  Glucose, capillary     Status: Abnormal   Collection Time: 07/16/17 11:08 AM  Result Value Ref Range   Glucose-Capillary 138 (H) 65 - 99 mg/dL  Glucose, capillary     Status: Abnormal   Collection Time: 07/16/17  4:05 PM  Result Value Ref Range   Glucose-Capillary 122 (H) 65 - 99 mg/dL  Glucose, capillary     Status: Abnormal   Collection Time: 07/16/17  10:00 PM  Result Value Ref Range   Glucose-Capillary 132 (H) 65 - 99 mg/dL  Hepatic function panel     Status: Abnormal   Collection Time: 07/17/17  4:23 AM  Result Value Ref Range   Total Protein 7.6 6.5 - 8.1 g/dL   Albumin 3.0 (L) 3.5 - 5.0 g/dL   AST 19 15 - 41 U/L   ALT 12 (L) 17 - 63 U/L   Alkaline Phosphatase 197 (H) 38 - 126 U/L   Total Bilirubin 2.3 (H) 0.3 - 1.2 mg/dL   Bilirubin, Direct 0.8 (H) 0.1 - 0.5 mg/dL   Indirect Bilirubin 1.5 (H) 0.3 - 0.9 mg/dL    Comment: Performed at [facility], [address], [place], [place] [address]  Basic metabolic panel     Status: Abnormal   Collection Time: 07/17/17  4:23 AM  Result Value Ref Range   Sodium 135 135 - 145 mmol/L   Potassium 4.4 3.5 - 5.1 mmol/L   Chloride 91 (L) 101 - 111 mmol/L   CO2 34 (H) 22 - 32 mmol/L   Glucose, Bld 108 (H) 65 - 99 mg/dL   BUN 36 (H) 6 - 20 mg/dL   Creatinine, Ser 1.39 (H) 0.61 - 1.24 mg/dL   Calcium 8.9 8.9 - 10.3 mg/dL   GFR calc non Af Amer 59 (L) >60 mL/min   GFR calc Af Amer >60 >60 mL/min    Comment: (NOTE) The eGFR has  been calculated using the CKD EPI equation. This calculation has not been validated in all clinical situations. eGFR'Vance persistently <60 mL/min signify possible Chronic Kidney Disease.    Anion gap 10 5 - 15    Comment: Performed at [facility], [address], [place], [place] [address]  Glucose, capillary     Status: None   Collection Time: 07/17/17  7:11 AM  Result Value Ref Range   Glucose-Capillary 89 65 - 99 mg/dL  Glucose, capillary     Status: Abnormal   Collection Time: 07/17/17 11:13 AM  Result Value Ref Range   Glucose-Capillary 148 (H) 65 - 99 mg/dL  Glucose, capillary     Status: Abnormal   Collection Time: 07/17/17  4:08 PM  Result Value Ref Range   Glucose-Capillary 117 (H) 65 - 99 mg/dL  Glucose, capillary     Status: Abnormal   Collection Time: 07/17/17  9:14 PM  Result Value Ref Range   Glucose-Capillary 109 (H) 65 - 99 mg/dL   Comment 1  Notify RN    Comment 2 Document in Chart   Comprehensive metabolic panel     Status: Abnormal   Collection Time: 07/18/17  4:15 AM  Result Value Ref Range   Sodium 135 135 - 145 mmol/L   Potassium 4.5 3.5 - 5.1 mmol/L   Chloride 90 (L) 101 - 111 mmol/L   CO2 33 (H) 22 - 32 mmol/L   Glucose, Bld 92 65 - 99 mg/dL   BUN 38 (H) 6 - 20 mg/dL   Creatinine, Ser 1.47 (H) 0.61 - 1.24 mg/dL   Calcium 8.7 (L) 8.9 - 10.3 mg/dL   Total Protein 7.5 6.5 - 8.1 g/dL   Albumin 3.0 (L) 3.5 - 5.0 g/dL   AST 19 15 - 41 U/L   ALT 12 (L) 17 - 63 U/L   Alkaline Phosphatase 190 (H) 38 - 126 U/L   Total Bilirubin 2.2 (H) 0.3 - 1.2 mg/dL   GFR calc non Af Amer 55 (L) >60 mL/min   GFR calc Af Amer >60 >60 mL/min    Comment: (NOTE) The eGFR has been calculated using the CKD EPI equation. This calculation has not been validated in all clinical situations. eGFR'Vance persistently <60 mL/min signify possible Chronic Kidney Disease.    Anion gap 12 5 - 15    Comment: Performed at [facility], [address], [place], [place] [address]  CBC     Status: None   Collection Time: 07/18/17  4:15 AM  Result Value Ref Range   WBC 8.7 4.0 - 10.5 K/uL   RBC 4.72 4.22 - 5.81 MIL/uL   Hemoglobin 13.5 13.0 - 17.0 g/dL   HCT 42.4 39.0 - 52.0 %   MCV 89.8 78.0 - 100.0 fL   MCH 28.6 26.0 - 34.0 pg   MCHC 31.8 30.0 - 36.0 g/dL   RDW 14.5 11.5 - 15.5 %   Platelets 271 150 - 400 K/uL    Comment: Performed at [facility], [address], [place], [place] [address]  Glucose, capillary     Status: None   Collection Time: 07/18/17  7:54 AM  Result Value Ref Range   Glucose-Capillary 93 65 - 99 mg/dL    No results found.  Review of Systems  Constitutional: Positive for malaise/fatigue. Negative for chills and fever.  Respiratory: Positive for shortness of breath. Negative for cough.   Cardiovascular: Positive for leg swelling. Negative for orthopnea.  Gastrointestinal: Negative for abdominal pain, nausea  and vomiting.    Blood pressure (!) 158/77, pulse 66, temperature 98 F (36.7 C), temperature source Oral, resp. rate 16, height _0  (1.778 m), weight 128.8 kg (284 lb), SpO2 99 %. Physical Exam  Constitutional: He is oriented to person, place, and time. No distress.  Eyes: No scleral icterus.  Neck: No JVD present.  Cardiovascular: Normal rate and regular rhythm.  Respiratory: No respiratory distress. He has no wheezes.  Decrease breath sound bilaterally  Musculoskeletal: He exhibits edema.  chronic change from venous stasis  Neurological: He is alert and oriented to person, place, and time.    Assessment/Plan: 1] renal failure: Possibly chronic.  Presently his creatinine has been fluctuating mainly because of fluid removal.  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 2] proteinuria: Patient has 1.2 g of proteinuria per 24 hours.  Even though his albumin is low and patient has anasarca this does not seem to be consistent with nephrotic syndrome.  Etiology for his proteinuria could be secondary to diabetes/hypertension/obesity related glomerulopathy.  Presently ANA is negative, hepatitis B and C is nonreactive.  Patient also does not have any M spike.  His complement is normal. 3] anasarca: Most likely hepatic [patient with fatty infiltrate and/or hepatocellular disease from his ultrasound.  Patient also has some elevation of his bilirubin.  Patient is on Lasix and with good urine output. 4] patient with moderate LVH.  Ejection fraction is 40 to 45%. 5] morbid obesity 6] diabetes: His blood sugar seems to be reasonably controlled 7] hypertension: His blood pressure is reasonably controlled. Plan: 1]We will DC IV Lasix 2] we will DC potassium supplement 3] we will start on antibiotics 40 mg p.o. once a day 4] we will add Aldactone 25 mg once a day 5] we will check his renal panel in the  morning  Jeremiah Vance 07/18/2017, 9:55 AM

## 2017-07-18 NOTE — Discharge Summary (Signed)
Physician Discharge Summary  JERON GRAHN LKT:[id number] DOB: January 18, 1971 DOA: 07/10/2017  PCP: No primary care provider on file.  Admit date: 07/10/2017 Discharge date: 07/18/2017  Time spent: 35 minutes  Recommendations for Outpatient Follow-up:  1. Repeat CMET to follow electrolytes, renal function and LFTs. 2. Reassess blood pressure and further adjust antihypertensive regimen as needed 3. Outpatient follow-up with cardiology service to further adjust and manage heart failure. 4. Follow and assist with further weight loss   Discharge Diagnoses:  Principal Problem:   Volume overload Active Problems:   Hyperbilirubinemia   Anemia   Diabetes mellitus with nephropathy (HCC)   Hypertension   Hypoalbuminemia   Tinea corporis   Anasarca   Elevated LFTs   Acute systolic HF (heart failure) (HCC)   Obesity, Class III, BMI 40-49.9 (morbid obesity) ([place])   Discharge Condition: Stable and improved.  Patient has been discharged home with instruction to follow-up with cardiology, nephrology and also to establish care with a PCP.  Diet recommendation: Heart healthy and modified carbohydrate diet.  Patient also instructed to follow a low calorie diet.  Filed Weights   07/16/17 3428 07/17/17 0549 07/18/17 0500  Weight: (!) 136.3 kg (300 lb 7.8 oz) 131.1 kg (289 lb 1.6 oz) 128.8 kg (284 lb)    History of present illness:  As per H&P written by Dr. Tennis Must on 07/10/17 47 y.o. male with medical history significant of type 2 diabetes, hypertension, morbid obesity who is coming to the emergency department with complaints of pain and edema of his scrotum that started this morning.   Per patient, he noticed some mild swelling on Wednesday.  He states that he normally does not have lower extremity edema.  However, when he woke up on Thursday morning he noticed severe edema of his scrotum, penis, hands and lower extremities.  He denies fever, chills, headache, sore throat, dyspnea, wheezing,  hemoptysis, chest pain, palpitations, dizziness, diaphoresis, PND or orthopnea.  No abdominal pain, nausea, emesis, diarrhea or constipation, melena or hematochezia.  Denies oliguria, dysuria, frequency or hematuria.  Denies polyuria, polydipsia or blurred vision.   Hospital Course:  1. Acute CHF: EF 40-45 %. Patient instructed to follow low sodium diet and to be compliant with meds. Will discharge on lisinopril 20 mg, spironolactone 25 mg daily, carvedilol 6.25 mg BID and demadex 41m daily. Patient would follow up with cardiology service as an outpatient for further management of his CHF. Patient with approx 42L off since admission. Weight on admission 380 pounds; weight at discharge 284 pounds.  2. Volume overload with hypoalbuminemia and CHF as mentioned above. Will discharge on demadex 482mdaily and spironolactone 2550maily. Patient would follow up with cardiology and nephrology service as an outpatient. Urine 24-hour protein without nephrotic range proteinuria.  Acute hepatitis panel negative.  ANA negative as well as SPEP. Discussed with nephrology who feels that the current management is appropriate.  Follow renal panel at follow up visit recommended. 3. Obesity: Body mass index is 41.48 kg/m. discussion about low calorie diet and exercise once again provided. 4. Hyperbilirubinemia.  Hepatitis panel negative and LFTs back to normal as these have been stable and back to normal. Most likely associated with passive congestion from CHF. Repeat CMET at follow up visit to reassess LFT's.  5. Type 2 diabetes.  A1C 6.4 During this admission. Prior records reported 12.3% in 2008. Will discharge on metformin 500m68mD and instructions to follow low carb diet. Outpatient follow up with PCP for further adjustment  in hypoglycemic regimen. Glucometer kti provided and instructed to check CBG's at least twice a day.  6. Hypertension.  Patient with good improvement in his blood pressure control.  At this time he  will be discharged on carvedilol 6.25 mg twice a day, spironolactone 25 mg daily, lisinopril 20 mg by mouth daily and Demadex 40 mg by mouth daily.  Advised to follow heart healthy diet and to follow-up with PCP/cardiology/nephrology.   7. Tinea corporis. Continue Lotrimin 1% application twice daily. Stable/improving. Patient instructed to avoid scratching and to maintain area clean and dry as much as possible.   Procedures: 2-D echo - Left ventricle: LVEF is approximately 40 to 45% with diffuse   hypokinesis, worse in the inferior, inferoseptal walls. . The   cavity size was mildly dilated. Wall thickness was increased in a   pattern of moderate LVH. - Aortic valve: There was trivial regurgitation. - Left atrium: The atrium was mildly dilated. - Right ventricle: The cavity size was mildly dilated. Systolic   function was mildly reduced. - Pericardium, extracardiac: A trivial pericardial effusion was   identified.  Consultations:  Cardiology (curbside and with plans to follow up as an outpatient)  Nephrology  Discharge Exam: Vitals:   07/17/17 2306 07/18/17 0704  BP: (!) 144/81 (!) 158/77  Pulse: 69 66  Resp: 18 16  Temp: 98.6 F (37 C) 98 F (36.7 C)  SpO2: 98% 99%   General exam: Afebrile, no chest pain, no nausea or vomiting.  Reports significant improvement in his breathing and in fact denies shortness of breath currently.  Still with some fluid overload appreciated in his torso and lower extremities; but significantly improved and now with some elevation in his Cr.[name] Kitchen   Respiratory system:  Good air movement bilaterally, no frank crackles, no using accessory muscles, no wheezing, good oxygen saturation on room air.  Cardiovascular system:  S1 and S2, no rubs, no gallops, no murmurs, no frank JVD appreciated on exam.  2+ lower extremity edema bilaterally. Gastrointestinal system: Obese, soft, nontender, positive bowel sounds, no distention or guarding appreciated.   Central  nervous system: Alert, awake and oriented x3, no focal deficits appreciated, cranial nerves grossly intact.  Extremities: No cyanosis, no clubbing, 2+ edema bilaterally. Psychiatry: Good judgment and insight, no hallucinations; stable mood.   Discharge Instructions   Discharge Instructions    Diet - low sodium heart healthy   Complete by:  As directed    Discharge instructions   Complete by:  As directed    Follow low calorie and low sodium diet (less than 2000 mg of sodium in 24 hours) Maintain adequate hydration Take medications as prescribed Follow-up with cardiology as instructed and follow-up in 2-3 weeks with nephrologist. Make sure to establish care with primary care doctor and follow-up with him to set up care and received post hospitalization care. Check your weight on daily basis (follow reflux instructions regarding fluid retention, more than 3 pounds overnight and/or more than 5 pounds in a week).     Allergies as of 07/18/2017   No Known Allergies     Medication List    TAKE these medications   acetaminophen 500 MG tablet Commonly known as:  TYLENOL Take 500 mg by mouth every 6 (six) hours as needed for mild pain or moderate pain.   blood glucose meter kit and supplies Kit Dispense based on patient and insurance preference. Use up to four times daily as directed. (FOR ICD-9 250.00, 250.01).   carvedilol 6.25  MG tablet Commonly known as:  COREG Take 1 tablet (6.25 mg total) by mouth 2 (two) times daily.   clotrimazole 1 % cream Commonly known as:  LOTRIMIN Apply topically 2 (two) times daily. Apply to suprapubic folds; keep area clean and dry   ibuprofen 200 MG tablet Commonly known as:  ADVIL,MOTRIN Take 1 tablet (200 mg total) by mouth every 8 (eight) hours as needed for mild pain or moderate pain. What changed:  when to take this   lisinopril 20 MG tablet Commonly known as:  PRINIVIL,ZESTRIL Take 1 tablet (20 mg total) by mouth daily. Start taking on:   07/19/2017   metFORMIN 500 MG tablet Commonly known as:  GLUCOPHAGE Take 1 tablet (500 mg total) by mouth 2 (two) times daily with a meal.   spironolactone 25 MG tablet Commonly known as:  ALDACTONE Take 1 tablet (25 mg total) by mouth daily. Start taking on:  07/19/2017   torsemide 20 MG tablet Commonly known as:  DEMADEX Take 2 tablets (40 mg total) by mouth daily. Start taking on:  07/19/2017      No Known Allergies Follow-up Information    Herminio Commons, MD Follow up on 08/06/2017.   Specialty:  Cardiology Why:  Cardiology Hospital Follow-Up on 08/06/2017 at 8:40AM.  Contact information: [address] [address] [phone]        Fran Lowes, MD. Schedule an appointment as soon as possible for a visit in 3 week(s).   Specialty:  Nephrology Contact information: [address] [place] [address] [phone]           The results of significant diagnostics from this hospitalization (including imaging, microbiology, ancillary and laboratory) are listed below for reference.    Significant Diagnostic Studies: Dg Chest 2 View  Result Date: 07/10/2017 CLINICAL DATA:  Shortness of breath EXAM: CHEST - 2 VIEW COMPARISON:  CT 07/10/2017 FINDINGS: Cardiomegaly with vascular congestion and mild pulmonary edema. Small left pleural effusion. Moderate right pleural effusion. Straight edge on lateral view posteriorly suggests possible loculation. No pneumothorax. IMPRESSION: 1. Cardiomegaly with vascular congestion and mild pulmonary edema 2. There are bilateral pleural effusions, small on the left and moderate on the right with possible loculation on the right. Electronically Signed   By: Donavan Foil M.D.   On: 07/10/2017 18:43   Ct Abdomen Pelvis W Contrast  Result Date: 07/10/2017 CLINICAL DATA:  47 y/o  M; edema and pain to the testicles. EXAM: CT ABDOMEN AND PELVIS WITH CONTRAST TECHNIQUE: Multidetector CT imaging of the abdomen and pelvis  was performed using the standard protocol following bolus administration of intravenous contrast. CONTRAST:  150m ISOVUE-300 IOPAMIDOL (ISOVUE-300) INJECTION 61% COMPARISON:  12/04/2008 CT abdomen and pelvis FINDINGS: Lower chest: Moderate right and small left pleural effusions. Hepatobiliary: No focal liver lesion. Cholelithiasis. No biliary ductal dilatation. Pancreas: Unremarkable. No pancreatic ductal dilatation or surrounding inflammatory changes. Spleen: Small lucencies within the periphery of the spleen that persists on the delayed phase may represent small areas of infarction. No other focal splenic lesion identified. Adrenals/Urinary Tract: Adrenal glands are unremarkable. Kidneys are normal, without renal calculi, focal lesion, or hydronephrosis. Bladder is unremarkable. Stomach/Bowel: Left paramedian paraumbilical hernia with broad neck measuring 8.1 x 4.4 cm ML BY CC containing fat and small bowel without proximal obstruction. This hernia is increased in size from prior study. Vascular/Lymphatic: Aortic atherosclerosis. No enlarged abdominal or pelvic lymph nodes. Reproductive: Prostate is unremarkable. Extensive scrotal edema. Anasarca. Other: Small volume of ascites.  Musculoskeletal: No fracture is seen. IMPRESSION: 1. Anasarca, scrotal edema, small volume ascites, and bilateral pleural effusions compatible with third spacing. 2. Cholelithiasis. 3. Small peripheral lucencies in the spleen, possible small infarctions. 4. Large paraumbilical hernia containing fat and small bowel without obstruction, increased in size. Electronically Signed   By: Kristine Garbe M.D.   On: 07/10/2017 19:00   US Renal  Result Date: 07/11/2017 CLINICAL DATA:  Proteinuria, diabetes, hypertension, obesity. EXAM: RENAL / URINARY TRACT ULTRASOUND COMPLETE COMPARISON:  None. FINDINGS: Right Kidney: Length: 12.5 cm. Echogenicity within normal limits. No mass or hydronephrosis visualized. Left Kidney: Length: 12 cm.  Echogenicity within normal limits. No mass or hydronephrosis visualized. Bladder: Decompressed limiting characterization. IMPRESSION: Kidneys appear normal.  No hydronephrosis. Electronically Signed   By: Franki Cabot M.D.   On: 07/11/2017 09:23   US Scrotum W/doppler  Result Date: 07/10/2017 CLINICAL DATA:  Scrotal swelling and pain.  Pain for 1 day EXAM: SCROTAL ULTRASOUND DOPPLER ULTRASOUND OF THE TESTICLES TECHNIQUE: Complete ultrasound examination of the testicles, epididymis, and other scrotal structures was performed. Color and spectral Doppler ultrasound were also utilized to evaluate blood flow to the testicles. COMPARISON:  None. FINDINGS: Right testicle Measurements: Normal in size and homogeneous echotexture measuring 3.9 by 2.8 x 2.7 cm. No mass or microlithiasis visualized. Left testicle Measurements: Normal in size and homogeneous echotexture measuring 3.8 x 2.7 x 3.4 cm. No mass or microlithiasis visualized. Right epididymis:  Not identified Left epididymis:  But not identified Hydrocele:  None visualized. Varicocele:  None visualized. Pulsed Doppler interrogation of both testes demonstrates normal low resistance arterial and venous waveforms bilaterally. Other: There is extensive scrotal wall edema involving the LEFT hemiscrotum and RIGHT hemiscrotum IMPRESSION: 1. No evidence of testicular torsion or abscess. Normal vascular flow to the LEFT and RIGHT testicle which are normal size. 2. Extensive edema and thickening of the scrotal wall. Electronically Signed   By: Suzy Bouchard M.D.   On: 07/10/2017 13:57   US Abdomen Limited Ruq  Result Date: 07/11/2017 CLINICAL DATA:  Hyperbilirubinemia. EXAM: ULTRASOUND ABDOMEN LIMITED RIGHT UPPER QUADRANT COMPARISON:  CT 07/10/2017. FINDINGS: Gallbladder: Multiple tiny gallstones noted. Gallstones measure up to 1.6 cm. Gallbladder wall thickness 5 mm. Negative Murphy sign. Common bile duct: Diameter: 3 mm Liver: Increased hepatic echogenicity  consistent fatty infiltration and/or pedis cellular disease. Portal vein is patent on color Doppler imaging with normal direction of blood flow towards the liver. Incidental note made of right pleural effusion. IMPRESSION: 1. Cholelithiasis. Mild prominence of the gallbladder wall at 5 mm. Cholecystitis cannot be excluded. Negative Murphy sign. No biliary distention. 2. Increased hepatic echogenicity consistent fatty infiltration and/or hepatocellular disease. No focal hepatic abnormality identified. 3.  Right pleural effusion. Electronically Signed   By: Marcello Moores  Register   On: 07/11/2017 09:16    Microbiology: No results found for this or any previous visit (from the past 240 hour(s)).   Labs: Basic Metabolic Panel: Recent Labs  Lab 07/13/17 0635 07/14/17 0544 07/15/17 0550 07/16/17 0523 07/17/17 0423 07/18/17 0415  NA 141 135 136 136 135 135  K 4.0 3.7 4.1 4.9 4.4 4.5  CL 98* 93* 92* 92* 91* 90*  CO2 32 32 32 34* 34* 33*  GLUCOSE 91 94 106* 100* 108* 92  BUN 21* 23* 28* 33* 36* 38*  CREATININE 1.23 1.25* 1.28* 1.24 1.39* 1.47*  CALCIUM 9.0 8.6* 8.7* 8.9 8.9 8.7*  MG 2.0  --   --   --   --   --  Liver Function Tests: Recent Labs  Lab 07/14/17 0544 07/15/17 0550 07/16/17 0523 07/17/17 0423 07/18/17 0415  AST '18 18 20 19 19  ' ALT 12* 12* 13* 12* 12*  ALKPHOS 190* 179* 203* 197* 190*  BILITOT 2.0* 2.0* 2.0* 2.3* 2.2*  PROT 7.1 7.0 8.0 7.6 7.5  ALBUMIN 2.9* 2.9* 3.3* 3.0* 3.0*   CBC: Recent Labs  Lab 07/18/17 0415  WBC 8.7  HGB 13.5  HCT 42.4  MCV 89.8  PLT 271   BNP: BNP (last 3 results) Recent Labs    07/10/17 1645  BNP 852.0*    CBG: Recent Labs  Lab 07/17/17 1113 07/17/17 1608 07/17/17 2114 07/18/17 0754 07/18/17 1214  GLUCAP 148* 117* 109* 93 126*    Signed:  Barton Dubois MD.  [facility] 07/18/2017, 1:32 PM

## 2017-07-18 NOTE — Progress Notes (Signed)
IV discontinued,catheter intact. Discharge instructions given on medications,and follow up visits,patient verbalized understanding.Prescriptions sent with patient. Staff accompanied patient to awaiting vehicle.

## 2017-07-18 NOTE — Care Management Note (Addendum)
Case Management Note  Patient Details  Name: BODIE CROCCO MRN: [id number] Date of Birth: 08-04-1970  Subjective/Objective:         In with volume overload. From home with wife. ind with ADL's. Employed FT, drives, no PCP, no insurance. Pt makes just above 200% of poverty level giving him limited options for affordable f/u care.  (Pt has Thornton Papas listed as PCP, pt does not know who this is or what why it is listed. CM will remove.)  Pt has seen the financial counselor and will complete financial assistance paper work when he gets in the mail.        Action/Plan: DC home today. given MATCH voucher. Given list of options for f/u care including [facility], [facility], [facility] (possibly get in on May 1st), or [facility] (will have to call to see if they are taking new patients on Monday). Pt aware he will be responsible for making f/u appointment d/t today being holiday and not having already decided on where he wants to go. Pt will also follow up with cardiology.   Expected Discharge Date:     07/18/2017             Expected Discharge Plan:  Home/Self Care  In-House Referral:  Financial Counselor  Discharge planning Services  CM Consult, [facility], [facility]  Post Acute Care Choice:  NA Choice offered to:  NA  Status of Service:  Completed, signed off  If discussed at Long Length of Stay Meetings, dates discussed:    Additional Comments:  Malcolm Metro, RN 07/18/2017, 10:32 AM

## 2017-07-22 MED FILL — FREESTYLE LANCETS: 25 days supply | Qty: 100 | Fill #0

## 2017-07-22 MED FILL — FREESTYLE LITE METER: 30 days supply | Qty: 1 | Fill #0

## 2017-07-22 MED FILL — FREESTYLE LITE TEST STRIP: 25 days supply | Qty: 100 | Fill #0

## 2017-08-06 ENCOUNTER — Ambulatory Visit: Payer: Self-pay | Admitting: Cardiovascular Disease

## 2017-08-30 DIAGNOSIS — I639 Cerebral infarction, unspecified: Secondary | ICD-10-CM

## 2017-08-30 HISTORY — DX: Cerebral infarction, unspecified: I63.9

## 2017-09-06 ENCOUNTER — Emergency Department (HOSPITAL_COMMUNITY): Payer: Self-pay

## 2017-09-06 ENCOUNTER — Observation Stay (HOSPITAL_COMMUNITY): Payer: Self-pay

## 2017-09-06 ENCOUNTER — Inpatient Hospital Stay (HOSPITAL_COMMUNITY)
Admission: EM | Admit: 2017-09-06 | Discharge: 2017-09-08 | DRG: 065 | Disposition: A | Discharge status: Home / Self Care | Payer: Self-pay | Attending: Family Medicine | Admitting: Family Medicine

## 2017-09-06 DIAGNOSIS — E1122 Type 2 diabetes mellitus with diabetic chronic kidney disease: Secondary | ICD-10-CM | POA: Diagnosis present

## 2017-09-06 DIAGNOSIS — I5033 Acute on chronic diastolic (congestive) heart failure: Secondary | ICD-10-CM

## 2017-09-06 DIAGNOSIS — I509 Heart failure, unspecified: Secondary | ICD-10-CM

## 2017-09-06 DIAGNOSIS — R531 Weakness: Secondary | ICD-10-CM

## 2017-09-06 DIAGNOSIS — N183 Chronic kidney disease, stage 3 (moderate): Secondary | ICD-10-CM | POA: Diagnosis present

## 2017-09-06 DIAGNOSIS — I639 Cerebral infarction, unspecified: Secondary | ICD-10-CM | POA: Diagnosis present

## 2017-09-06 DIAGNOSIS — Z23 Encounter for immunization: Secondary | ICD-10-CM

## 2017-09-06 DIAGNOSIS — R29703 NIHSS score 3: Secondary | ICD-10-CM | POA: Diagnosis present

## 2017-09-06 DIAGNOSIS — E1121 Type 2 diabetes mellitus with diabetic nephropathy: Secondary | ICD-10-CM | POA: Diagnosis present

## 2017-09-06 DIAGNOSIS — Z79899 Other long term (current) drug therapy: Secondary | ICD-10-CM

## 2017-09-06 DIAGNOSIS — E785 Hyperlipidemia, unspecified: Secondary | ICD-10-CM

## 2017-09-06 DIAGNOSIS — E1151 Type 2 diabetes mellitus with diabetic peripheral angiopathy without gangrene: Secondary | ICD-10-CM | POA: Diagnosis present

## 2017-09-06 DIAGNOSIS — Z6839 Body mass index (BMI) 39.0-39.9, adult: Secondary | ICD-10-CM

## 2017-09-06 DIAGNOSIS — Z7984 Long term (current) use of oral hypoglycemic drugs: Secondary | ICD-10-CM

## 2017-09-06 DIAGNOSIS — I5022 Chronic systolic (congestive) heart failure: Secondary | ICD-10-CM | POA: Diagnosis present

## 2017-09-06 DIAGNOSIS — I130 Hypertensive heart and chronic kidney disease with heart failure and stage 1 through stage 4 chronic kidney disease, or unspecified chronic kidney disease: Secondary | ICD-10-CM | POA: Diagnosis present

## 2017-09-06 DIAGNOSIS — I6381 Other cerebral infarction due to occlusion or stenosis of small artery: Principal | ICD-10-CM | POA: Diagnosis present

## 2017-09-06 DIAGNOSIS — R4702 Dysphasia: Secondary | ICD-10-CM

## 2017-09-06 DIAGNOSIS — M109 Gout, unspecified: Secondary | ICD-10-CM

## 2017-09-06 HISTORY — DX: Cerebral infarction, unspecified: I63.9

## 2017-09-06 LAB — CBC
HEMATOCRIT: 40.6 % (ref 39.0–52.0)
HEMOGLOBIN: 13.4 g/dL (ref 13.0–17.0)
MCH: 28.5 pg (ref 26.0–34.0)
MCHC: 33 g/dL (ref 30.0–36.0)
MCV: 86.4 fL (ref 78.0–100.0)
Platelets: 259 10*3/uL (ref 150–400)
RBC: 4.7 MIL/uL (ref 4.22–5.81)
RDW: 13.2 % (ref 11.5–15.5)
WBC: 8.1 10*3/uL (ref 4.0–10.5)

## 2017-09-06 LAB — I-STAT CHEM 8, ED
BUN: 30 mg/dL — ABNORMAL HIGH (ref 6–20)
CALCIUM ION: 1.04 mmol/L — AB (ref 1.15–1.40)
CHLORIDE: 101 mmol/L (ref 101–111)
CREATININE: 1.4 mg/dL — AB (ref 0.61–1.24)
Glucose, Bld: 156 mg/dL — ABNORMAL HIGH (ref 65–99)
HCT: 40 % (ref 39.0–52.0)
HEMOGLOBIN: 13.6 g/dL (ref 13.0–17.0)
POTASSIUM: 4.3 mmol/L (ref 3.5–5.1)
Sodium: 138 mmol/L (ref 135–145)
TCO2: 24 mmol/L (ref 22–32)

## 2017-09-06 LAB — DIFFERENTIAL
Abs Immature Granulocytes: 0 10*3/uL (ref 0.0–0.1)
Basophils Absolute: 0.1 10*3/uL (ref 0.0–0.1)
Basophils Relative: 1 %
EOS PCT: 3 %
Eosinophils Absolute: 0.2 10*3/uL (ref 0.0–0.7)
Immature Granulocytes: 0 %
LYMPHS ABS: 1.9 10*3/uL (ref 0.7–4.0)
LYMPHS PCT: 23 %
MONO ABS: 0.8 10*3/uL (ref 0.1–1.0)
MONOS PCT: 10 %
NEUTROS ABS: 5.1 10*3/uL (ref 1.7–7.7)
Neutrophils Relative %: 63 %

## 2017-09-06 LAB — COMPREHENSIVE METABOLIC PANEL
ALT: 25 U/L (ref 17–63)
AST: 27 U/L (ref 15–41)
Albumin: 3 g/dL — ABNORMAL LOW (ref 3.5–5.0)
Alkaline Phosphatase: 141 U/L — ABNORMAL HIGH (ref 38–126)
Anion gap: 11 (ref 5–15)
BUN: 28 mg/dL — ABNORMAL HIGH (ref 6–20)
CO2: 26 mmol/L (ref 22–32)
Calcium: 8.8 mg/dL — ABNORMAL LOW (ref 8.9–10.3)
Chloride: 100 mmol/L — ABNORMAL LOW (ref 101–111)
Creatinine, Ser: 1.42 mg/dL — ABNORMAL HIGH (ref 0.61–1.24)
GFR calc Af Amer: >60 mL/min (ref >60)
GFR calc non Af Amer: 58 mL/min — ABNORMAL LOW (ref >60)
Glucose, Bld: 158 mg/dL — ABNORMAL HIGH (ref 65–99)
Potassium: 4.3 mmol/L (ref 3.5–5.1)
Sodium: 137 mmol/L (ref 135–145)
Total Bilirubin: 1.4 mg/dL — ABNORMAL HIGH (ref 0.3–1.2)
Total Protein: 7.1 g/dL (ref 6.5–8.1)

## 2017-09-06 LAB — CBG MONITORING, ED
Glucose-Capillary: 127 mg/dL — ABNORMAL HIGH (ref 65–99)
Glucose-Capillary: 80 mg/dL (ref 65–99)

## 2017-09-06 LAB — APTT: aPTT: 29 seconds (ref 24–36)

## 2017-09-06 LAB — I-STAT TROPONIN, ED: Troponin i, poc: 0.02 ng/mL (ref 0.00–0.08)

## 2017-09-06 LAB — PROTIME-INR
INR: 1.05
Prothrombin Time: 13.6 seconds (ref 11.4–15.2)

## 2017-09-06 LAB — BRAIN NATRIURETIC PEPTIDE: B Natriuretic Peptide: 229.4 pg/mL — ABNORMAL HIGH (ref 0.0–100.0)

## 2017-09-06 LAB — CK: Total CK: 42 U/L — ABNORMAL LOW (ref 49–397)

## 2017-09-06 MED ORDER — ACETAMINOPHEN 160 MG/5ML PO SOLN: 650.0000 mg | ORAL | Status: DC | PRN

## 2017-09-06 MED ORDER — STROKE: EARLY STAGES OF RECOVERY BOOK
Freq: Once | Status: AC
  Administered 2017-09-07: 22:00:00
  Filled 2017-09-06: qty 1

## 2017-09-06 MED ORDER — ASPIRIN 325 MG PO TABS
325.0000 mg | ORAL_TABLET | Freq: Once | ORAL | Status: DC
  Filled 2017-09-06: qty 1

## 2017-09-06 MED ORDER — ENOXAPARIN SODIUM 40 MG/0.4ML ~~LOC~~ SOLN
40.0000 mg | SUBCUTANEOUS | Status: DC
  Administered 2017-09-06 – 2017-09-07 (×2): 40 mg via SUBCUTANEOUS
  Filled 2017-09-06 (×3): qty 0.4

## 2017-09-06 MED ORDER — ACETAMINOPHEN 650 MG RE SUPP: 650.0000 mg | RECTAL | Status: DC | PRN

## 2017-09-06 MED ORDER — SODIUM CHLORIDE 0.9 % IV SOLN
INTRAVENOUS | Status: DC
  Administered 2017-09-06 – 2017-09-07 (×2): via INTRAVENOUS
  Administered 2017-09-07: 500 mL via INTRAVENOUS
  Administered 2017-09-07 – 2017-09-08 (×2): via INTRAVENOUS

## 2017-09-06 MED ORDER — HYDRALAZINE HCL 20 MG/ML IJ SOLN: 10.0000 mg | Freq: Three times a day (TID) | INTRAMUSCULAR | Status: DC | PRN

## 2017-09-06 MED ORDER — TORSEMIDE 20 MG PO TABS
40.0000 mg | ORAL_TABLET | Freq: Every day | ORAL | Status: DC
  Administered 2017-09-08: 40 mg via ORAL
  Filled 2017-09-06: qty 2

## 2017-09-06 MED ORDER — ACETAMINOPHEN 325 MG PO TABS
650.0000 mg | ORAL_TABLET | ORAL | Status: DC | PRN
  Administered 2017-09-07 – 2017-09-08 (×3): 650 mg via ORAL
  Filled 2017-09-06 (×4): qty 2

## 2017-09-06 MED ORDER — CLOTRIMAZOLE 1 % EX CREA
TOPICAL_CREAM | Freq: Two times a day (BID) | CUTANEOUS | Status: DC
  Administered 2017-09-08: 12:00:00 via TOPICAL
  Filled 2017-09-06: qty 15

## 2017-09-06 MED ORDER — SENNOSIDES-DOCUSATE SODIUM 8.6-50 MG PO TABS: 1.0000 | ORAL_TABLET | Freq: Every evening | ORAL | Status: DC | PRN

## 2017-09-06 NOTE — ED Notes (Signed)
Patient transported to MRI 

## 2017-09-06 NOTE — ED Notes (Signed)
Pt Family Member Phone Number Nigel Bridgeman" [place]  [phone]

## 2017-09-06 NOTE — ED Provider Notes (Signed)
[place] EMERGENCY DEPARTMENT Provider Note   CSN: [id number] Arrival date & time: 09/06/17  1543     History   Chief Complaint Chief Complaint  Patient presents with  . Code Stroke    HPI Jeremiah Vance is a 47 y.o. male hx of DM, HTN, obesity, here with facial numbness, slurred speech. Patient was at work and had sudden onset of trouble speaking and "just didn't feel right" and right sided weakness. Code stroke was activated by EMS. Patient was recently admitted for heart failure and has no previous hx of stroke in the past.   The history is provided by the patient.    Past Medical History:  Diagnosis Date  . Diabetes mellitus without complication ([place])   . Hypertension   . Morbid obesity [facility])     Patient Active Problem List   Diagnosis Date Noted  . Anasarca   . Elevated LFTs   . Acute systolic HF (heart failure) ([place])   . Obesity, Class III, BMI 40-49.9 (morbid obesity) ([place])   . Hypoalbuminemia 07/11/2017  . Tinea corporis 07/11/2017  . Volume overload 07/10/2017  . Hyperbilirubinemia 07/10/2017  . Anemia 07/10/2017  . Diabetes mellitus with nephropathy ([place]) 07/10/2017  . Hypertension 07/10/2017    Past Surgical History:  Procedure Laterality Date  . HERNIA REPAIR          Home Medications    Prior to Admission medications   Medication Sig Start Date End Date Taking? Authorizing Provider  acetaminophen (TYLENOL) 500 MG tablet Take 500 mg by mouth every 6 (six) hours as needed for mild pain or moderate pain.    [provider]  blood glucose meter kit and supplies KIT Dispense based on patient and insurance preference. Use up to four times daily as directed. (FOR ICD-9 250.00, 250.01). 07/18/17   Barton Dubois, MD  carvedilol (COREG) 6.25 MG tablet Take 1 tablet (6.25 mg total) by mouth 2 (two) times daily. 07/18/17 07/18/18  Barton Dubois, MD  clotrimazole (LOTRIMIN) 1 % cream Apply topically 2 (two) times daily. Apply to  suprapubic folds; keep area clean and dry 07/18/17   Barton Dubois, MD  ibuprofen (ADVIL,MOTRIN) 200 MG tablet Take 1 tablet (200 mg total) by mouth every 8 (eight) hours as needed for mild pain or moderate pain. 07/18/17   Barton Dubois, MD  lisinopril (PRINIVIL,ZESTRIL) 20 MG tablet Take 1 tablet (20 mg total) by mouth daily. 07/19/17   Barton Dubois, MD  metFORMIN (GLUCOPHAGE) 500 MG tablet Take 1 tablet (500 mg total) by mouth 2 (two) times daily with a meal. 07/18/17 07/18/18  Barton Dubois, MD  spironolactone (ALDACTONE) 25 MG tablet Take 1 tablet (25 mg total) by mouth daily. 07/19/17   Barton Dubois, MD  torsemide (DEMADEX) 20 MG tablet Take 2 tablets (40 mg total) by mouth daily. 07/19/17   Barton Dubois, MD    Family History Family History  Problem Relation Age of Onset  . Diabetes Father   . Diabetes Sister     Social History Social History   Tobacco Use  . Smoking status: Never Smoker  . Smokeless tobacco: Never Used  Substance Use Topics  . Alcohol use: Never    Frequency: Never  . Drug use: Never     Allergies   Patient has no known allergies.   Review of Systems Review of Systems  Neurological: Positive for speech difficulty and weakness.  All other systems reviewed and are negative.    Physical Exam  Updated Vital Signs BP (!) 145/73   Pulse (!) 57   Temp 98.2 F (36.8 C) (Oral)   Resp 16   Ht 5' 10" (1.778 m)   Wt 126 kg (277 lb 12.5 oz)   SpO2 100%   BMI 39.86 kg/m   Physical Exam  Constitutional: He is oriented to person, place, and time.  Chronically ill   HENT:  Head: Normocephalic.  Mouth/Throat: Oropharynx is clear and moist.  Eyes: Pupils are equal, round, and reactive to light. Conjunctivae and EOM are normal.  Neck: Normal range of motion. Neck supple.  Cardiovascular: Normal rate, regular rhythm and normal heart sounds.  Pulmonary/Chest: Effort normal and breath sounds normal. No stridor. No respiratory distress. He has no wheezes.   Abdominal: Soft. Bowel sounds are normal. He exhibits no distension. There is no tenderness. There is no guarding.  Musculoskeletal: Normal range of motion.  Neurological: He is alert and oriented to person, place, and time.  No obvious facial droop. Some expressive aphasia. Strength 5/5 throughout. Nl finger to nose. Slightly dec sensation R arm.   Skin: Skin is warm.  Psychiatric: He has a normal mood and affect.  Nursing note and vitals reviewed.    ED Treatments / Results  Labs (all labs ordered are listed, but only abnormal results are displayed) Labs Reviewed  COMPREHENSIVE METABOLIC PANEL - Abnormal; Notable for the following components:      Result Value   Chloride 100 (*)    Glucose, Bld 158 (*)    BUN 28 (*)    Creatinine, Ser 1.42 (*)    Calcium 8.8 (*)    Albumin 3.0 (*)    Alkaline Phosphatase 141 (*)    Total Bilirubin 1.4 (*)    GFR calc non Af Amer 58 (*)    All other components within normal limits  BRAIN NATRIURETIC PEPTIDE - Abnormal; Notable for the following components:   B Natriuretic Peptide 229.4 (*)    All other components within normal limits  CBG MONITORING, ED - Abnormal; Notable for the following components:   Glucose-Capillary 127 (*)    All other components within normal limits  I-STAT CHEM 8, ED - Abnormal; Notable for the following components:   BUN 30 (*)    Creatinine, Ser 1.40 (*)    Glucose, Bld 156 (*)    Calcium, Ion 1.04 (*)    All other components within normal limits  PROTIME-INR  APTT  CBC  DIFFERENTIAL  I-STAT TROPONIN, ED    EKG EKG Interpretation  Date/Time:  Saturday September 06 2017 16:10:42 EDT Ventricular Rate:  57 PR Interval:    QRS Duration: 112 QT Interval:  477 QTC Calculation: 465 R Axis:   48 Text Interpretation:  Sinus rhythm Probable left atrial enlargement Borderline intraventricular conduction delay No previous ECGs available Confirmed by Yao, David H ([id number]) on 09/06/2017 4:56:49 PM   Radiology Mr  Brain Wo Contrast  Result Date: 09/06/2017 CLINICAL DATA:  Code stroke.  Right-sided weakness and aphasia. EXAM: MRI HEAD WITHOUT CONTRAST TECHNIQUE: Multiplanar, multiecho pulse sequences of the brain and surrounding structures were obtained without intravenous contrast. COMPARISON:  CT head without contrast of the same day. FINDINGS: Brain: The diffusion-weighted images demonstrate acute punctate nonhemorrhagic infarct involving the posterior limb of the left internal capsule. T2 signal changes are associated with this infarct. Periventricular white matter changes are moderately advanced for age. Remote lacunar infarcts are present in the basal ganglia and thalami bilaterally. The susceptibility weighted images demonstrate   punctate foci of susceptibility within the basal ganglia bilaterally. The ventricles are of normal size. No significant extra-axial fluid collection is present. The internal auditory canals are within normal limits. Asymmetric white matter changes are present in the left midbrain. The cerebellum is within normal limits. Vascular: Flow is present in the major intracranial arteries. Globes and orbits are within normal limits. Skull and upper cervical spine: The skull base is within normal limits. The craniocervical junction is normal. The upper cervical spine is within normal limits. Sinuses/Orbits: The paranasal sinuses and mastoid air cells are clear. Globes and orbits are within normal limits. IMPRESSION: 1. 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 2. Multiple other remote lacunar infarcts involving the basal ganglia and thalami bilaterally. 3. Periventricular white matter changes are moderately advanced for age. 4. Remote foci of susceptibility within the basal ganglia bilaterally. 5. Constellation of findings suggests an underlying vasculitis or significant microvascular ischemic disease. Electronically Signed   By: San Morelle  M.D.   On: 09/06/2017 18:03   Dg Chest Port 1 View  Result Date: 09/06/2017 CLINICAL DATA:  Lightheadedness today EXAM: PORTABLE CHEST 1 VIEW COMPARISON:  July 10, 2017 FINDINGS: The heart size and mediastinal contours are stable. The heart size is enlarged. There is no focal infiltrate, pulmonary edema, or pleural effusion. The visualized skeletal structures are unremarkable. IMPRESSION: No active cardiopulmonary disease. Electronically Signed   By: Abelardo Diesel M.D.   On: 09/06/2017 16:53   Ct Head Code Stroke Wo Contrast  Result Date: 09/06/2017 CLINICAL DATA:  Code stroke. 47 year old male with right side weakness and aphasia. EXAM: CT HEAD WITHOUT CONTRAST TECHNIQUE: Contiguous axial images were obtained from the base of the skull through the vertex without intravenous contrast. COMPARISON:  None. FINDINGS: Brain: No midline shift, ventriculomegaly, mass effect, evidence of mass lesion, intracranial hemorrhage or evidence of cortically based acute infarction. Minimal subcortical white matter hypodensity, otherwise gray-white matter differentiation is within normal limits throughout the brain. Vascular: Calcified atherosclerosis at the skull base. No suspicious intracranial vascular hyperdensity. Skull: No acute osseous abnormality identified. Hyperostosis, normal variant. Sinuses/Orbits: Visualized paranasal sinuses and mastoids are clear. Other: Visualized orbit soft tissues are within normal limits. Visualized scalp soft tissues are within normal limits. ASPECTS [facility] Stroke Program Early CT Score) - Ganglionic level infarction (caudate, lentiform nuclei, internal capsule, insula, M1-M3 cortex): 7 - Supraganglionic infarction (M4-M6 cortex): 3 Total score (0-10 with 10 being normal): 10 IMPRESSION: 1. Normal for age noncontrast CT appearance of the brain. 2. ASPECTS is 10. 3. These results were communicated to Dr. Cheral Marker at [place] 6/8/2019by text page via the [facility] messaging system. *  Electronically Signed   By: Genevie Ann M.D.   On: 09/06/2017 16:05    Procedures Procedures (including critical care time)  CRITICAL CARE Performed by: Wandra Arthurs   Total critical care time: 30 minutes  Critical care time was exclusive of separately billable procedures and treating other patients.  Critical care was necessary to treat or prevent imminent or life-threatening deterioration.  Critical care was time spent personally by me on the following activities: development of treatment plan with patient and/or surrogate as well as nursing, discussions with consultants, evaluation of patient's response to treatment, examination of patient, obtaining history from patient or surrogate, ordering and performing treatments and interventions, ordering and review of laboratory studies, ordering and review of radiographic studies, pulse oximetry and re-evaluation of patient's condition.   Medications Ordered in ED Medications -  No data to display   Initial Impression / Assessment and Plan / ED Course  I have reviewed the triage vital signs and the nursing notes.  Pertinent labs & imaging results that were available during my care of the patient were reviewed by me and considered in my medical decision making (see chart for details).     Thorn D Stegenga is a 46 y.o. male here with R sided weakness, trouble speaking. Speech improved in the ED. Patient has nonfocal neuro exam. Dr. Lindzen from neurology saw patient and stated that patient doesn't qualify for TPA currently. CT head showed no bleed. Will get MRI brain.   6:28 PM MRI brain showed acute infarct with multiple previous infarcts. I updated Dr. Lindzen. Considered TPA but patient not a TPA candidate and symptoms improving. Will admit for stroke workup      Final Clinical Impressions(s) / ED Diagnoses   Final diagnoses:  None    ED Discharge Orders    None       Yao, David Hsienta, MD 09/06/17 1831  

## 2017-09-06 NOTE — ED Notes (Signed)
Pt returned from MRI °

## 2017-09-06 NOTE — ED Notes (Signed)
Intern paged for PRN HTN meds and parameters

## 2017-09-06 NOTE — ED Triage Notes (Signed)
EMS called for onset of facial numbness and slurred speech; LKW 1500; neurologist and rapid response at [facility] on arrival, AOx4 on arrival

## 2017-09-06 NOTE — H&P (Addendum)
[facility] Admission History and Physical Service Pager: [phone]  Patient name: ABRAHM MANCIA Medical record number: [id number] Date of birth: [birth year] Age: 47 y.o. Gender: male  Primary Care Provider: Patient, No Pcp Per Consultants: neurology Code Status: full  Chief Complaint: speech difficulty  Assessment and Plan: Duard D Zorn is a 47 y.o. male presenting with word finding difficulty, weakness, and right sided facial numbness. MRI showing punctate infarct involving the posterior limb of L internal capsule.  Left internal capsule stroke Acute. Diagnosed on head CT which is c/w clinical features.  Likely embolic in nature, non-hemorrhagic. Given non-severity of symptoms no tpa administration. Will admit for stroke workup. Will get echo, carotid dopplers. Patient will need pt/ot evaluation. He failed his swallow study so will have speech evaluate. Neurology following. Received aspirin load.  Source may be vasculitis, vs cardioembolic. - Admit to [facility] medicine Inpatient service, Telemetry, Dr. Andria Frames - vital signs per telemetry routine - Neurology following appreciate their recs - received aspirin load, aspirin 5m daily afterwards - cardiac monitoring - check CK, if ok will start atorvastatin - follow up echocardiogram - carotid dopplers - pt/ot eval - speech eval-> likely formal swallow study - permissive hypertension up 200 SBP - lovenox for dvt ppx - npo until passes swallow study - follow up lipid panel  Systolic heart failure [facility] Per notes he diuresed over 100lbs and have negative 40L over 8 days. Discharged on lisinopril 257m spiro 2553maily, coreg 6.70m46md, demadex 40mg71mho on 4/12 showing 40-45% EF. BNP 230 which is down significantly from 852 on 4/11. - Continue torsemide 40mg 48my - Will hold spiro 70mg d52m, lisinopril, and coreg for permissive htn - Will get echo per stroke  workup  Type II diabetes w/ nephropathic Hgb a1c 6.4 on 4/12. On metformin at home. Will hold  Metformin while inpatient. Did not pass swallow study so will have to continue npo until formal swallow with speech therapy. Q 4 hours glucose checks until taking in PO. - NPO - q 4 hours glucose checks - could start d5 in fluids if becomes   CKD stage 3a Creatinine 1.4, gfr 58. Stable from 4/18. Maintenance fluids 75mL/hr55mnormal saline. - daily bmp  PMH is significant for systolic heart failure, diabetes mellitus with nephropathy, morbid obesity  FEN/GI: NPO until passes swallow study Prophylaxis: lovenox  Disposition: pending clinical course  History of Present Illness:  Younis D Creque is a 46 y.o. 25le presenting with sudden onset of slurring speech, word finding difficulty, and facial numbness. The onset was around 1500 on 6/8 while he was at work. A nurse who happened to at his place of work noticed he was slurring his speech and called 911. Patient brought to [facility] as a code stroke. CT head did now show acute infarct. Follow up MRI head revealed punctate nonhemorrhagic infract involving the posterior limb of the left internal capsule. Found to have multiple remote infarcts involving basal ganglia.  Patient has slowly improved and now does not have any significant motor abnormalities. He continues to have word finding difficulties but he is improving. Additional workup in the ED consisted of a cmp, bnp, ical, troponin, cbc, chest xray, ekg. CMP significant for glucose 158, cr 1.42, bun 28, albumin 3.0. BNP 230 (852 on 4/11). Troponin 0.02, h/h 13.6, 40. CXR without any focal abnormality. EKG showed sinus rhythm.  Patient also complaining of left foot large toe pain. States  it started yesterday. Painful to touch. No erythema but significant limitation of flexion.  Review Of Systems: Per HPI with the following additions:   Review of Systems  Constitutional: Negative for chills and fever.   HENT: Negative for congestion, ear discharge, ear pain, nosebleeds and sinus pain.   Eyes: Negative for pain.  Respiratory: Negative for cough and hemoptysis.   Cardiovascular: Negative for chest pain and palpitations.  Gastrointestinal: Negative for abdominal pain, constipation, diarrhea, nausea and vomiting.  Musculoskeletal: Negative for falls, myalgias and neck pain.       Toe pain  Neurological: Positive for sensory change, speech change and focal weakness. Negative for dizziness, loss of consciousness and headaches.    Patient Active Problem List   Diagnosis Date Noted  . Stroke ([place]) 09/06/2017  . Anasarca   . Elevated LFTs   . Acute systolic HF (heart failure) ([place])   . Obesity, Class III, BMI 40-49.9 (morbid obesity) ([place])   . Hypoalbuminemia 07/11/2017  . Tinea corporis 07/11/2017  . Volume overload 07/10/2017  . Hyperbilirubinemia 07/10/2017  . Anemia 07/10/2017  . Diabetes mellitus with nephropathy ([place]) 07/10/2017  . Hypertension 07/10/2017    Past Medical History: Past Medical History:  Diagnosis Date  . Diabetes mellitus without complication ([place])   . Hypertension   . Morbid obesity ([place])     Past Surgical History: Past Surgical History:  Procedure Laterality Date  . HERNIA REPAIR      Social History: Social History   Tobacco Use  . Smoking status: Never Smoker  . Smokeless tobacco: Never Used  Substance Use Topics  . Alcohol use: Never    Frequency: Never  . Drug use: Never    Family History: Family History  Problem Relation Age of Onset  . Diabetes Father   . Diabetes Sister     Allergies and Medications: No Known Allergies No current facility-administered medications on file prior to encounter.    Current Outpatient Medications on File Prior to Encounter  Medication Sig Dispense Refill  . acetaminophen (TYLENOL) 500 MG tablet Take 500 mg by mouth every 6 (six) hours as needed for mild pain or moderate pain.    . carvedilol (COREG)  6.25 MG tablet Take 1 tablet (6.25 mg total) by mouth 2 (two) times daily. 60 tablet 3  . ibuprofen (ADVIL,MOTRIN) 200 MG tablet Take 1 tablet (200 mg total) by mouth every 8 (eight) hours as needed for mild pain or moderate pain. (Patient taking differently: Take 400-600 mg by mouth every 8 (eight) hours as needed for mild pain (for knee pain). )    . lisinopril (PRINIVIL,ZESTRIL) 20 MG tablet Take 1 tablet (20 mg total) by mouth daily. 30 tablet 3  . metFORMIN (GLUCOPHAGE) 500 MG tablet Take 1 tablet (500 mg total) by mouth 2 (two) times daily with a meal. 60 tablet 2  . spironolactone (ALDACTONE) 25 MG tablet Take 1 tablet (25 mg total) by mouth daily. 30 tablet 3  . torsemide (DEMADEX) 20 MG tablet Take 2 tablets (40 mg total) by mouth daily. 30 tablet 3  . blood glucose meter kit and supplies KIT Dispense based on patient and insurance preference. Use up to four times daily as directed. (FOR ICD-9 250.00, 250.01). 1 each 0  . clotrimazole (LOTRIMIN) 1 % cream Apply topically 2 (two) times daily. Apply to suprapubic folds; keep area clean and dry (Patient not taking: Reported on 09/06/2017) 30 g 0    Objective: BP (!) 167/82   Pulse 61  Temp 98.2 F (36.8 C) (Oral)   Resp (!) 23   Ht '5\' 10"'  (1.778 m)   Wt 277 lb 12.5 oz (126 kg)   SpO2 100%   BMI 39.86 kg/m  Exam: General: alert, oriented x3. Obese caucasian male resting comfortably in bed. Eyes: eomi, perrla. ENTM: tongue midline, uvula midline. No external signs of trauma Neck: range of motion intact Cardiovascular: rrr, no m/r/g. Palpable peripheral pulses. Respiratory: lungs clear to ausculation bilaterally, no increased work of breathing Gastrointestinal: soft, non-tender, non-distended.  MSK: 5/5 strength BUE, BLE all muscle groups with appropriate coaching Derm: excoriation and erythema in BLE. Neuro: numbness in right mandibular nerve distribution, 4/5 RUE, other extremities are 5/5. Cn 2-12 intact, no uvular  deviation. Psych: appropriate, pleasant  Labs and Imaging: CBC BMET  Recent Labs  Lab 09/06/17 1547 09/06/17 1552  WBC 8.1  --   HGB 13.4 13.6  HCT 40.6 40.0  PLT 259  --    Recent Labs  Lab 09/06/17 1547 09/06/17 1552  NA 137 138  K 4.3 4.3  CL 100* 101  CO2 26  --   BUN 28* 30*  CREATININE 1.42* 1.40*  GLUCOSE 158* 156*  CALCIUM 8.8*  --       Guadalupe Dawn, MD 09/06/2017, 7:36 PM PGY-1, [place] Intern pager: [phone], text pages welcome  Upper Level Addendum: I have seen and evaluated this patient along with Dr. Kris Mouton and reviewed the above note, making necessary revisions in blue.  Harriet [place], [place], PGY-2

## 2017-09-06 NOTE — ED Notes (Signed)
Patient transported to X-ray 

## 2017-09-06 NOTE — Consult Note (Signed)
Referring Physician: Dr. Silverio Lay    Chief Complaint: Right sided weakness  HPI: Jeremiah Vance is an 47 y.o. male presenting with right sided weakness and stuttering speech while at work. Coworkers told EMS he "wasn't talking right". LKN 1500. BP was 130/72 in the field. He is not on a blood thinner or an antiplatelet medication.   LSN: 1500 tPA Given: No: Minimal to mild findings with stuttering speech pattern atypical for stroke. Inconsistencies on motor and ocular exam also noted, increasing likelihood for non-physiological etiology. Risks of tPA are felt to significantly outweigh benefits.   Past Medical History:  Diagnosis Date  . Diabetes mellitus without complication (HCC)   . Hypertension   . Morbid obesity (HCC)     Past Surgical History:  Procedure Laterality Date  . HERNIA REPAIR      Family History  Problem Relation Age of Onset  . Diabetes Father   . Diabetes Sister    Social History:  reports that he has never smoked. He has never used smokeless tobacco. He reports that he does not drink alcohol or use drugs.  Allergies: No Known Allergies  Medications:    ROS: Bilateral lower extremity swelling. Other ROS deferred due to acuity of presentation.   Physical Examination: There were no vitals taken for this visit.  HEENT: [place]/AT Lungs: Respirations unlabored Ext: Chronic severe edema distal lower extremities bilaterally with thickened skin and rubor.   Neurologic Examination: Mental Status: Alert, oriented, thought content appropriate. Odd affect. Speech fluent without evidence of aphasia; stuttering quality is most consistent with non-physiological origin. Able to follow all commands, name all objects and repeat a phrase. Cranial Nerves: II:  Visual fields with inconsistent right crescentic field cut in right eye. PERRL.  III,IV, VI: No ptosis. EOM are full.  Eyes slightly dysconjugate c/w exotropia. No nystagmus.  V,VII: Smile symmetric, facial light touch  sensation decreased on the right  VIII: Hearing intact to voice  IX,X: No hypophonia XI: Symmetric XII: No lingual dysarthria Motor: RUE: 5/5 with occasional decrease to 4+/5  LUE: 5/5 No pronator drift on either side Tone normal bilat upper ext Bilateral lower ext able to elevate antigravity x 5 sec, giveway on right due to chronic knee pain RLE not externally rotated.  Tone normal bilat LE Poor cooperation with LE exam due to stated pain in right knee, dorsum of left foot and left toe. Sensory: Intact FT and temp sensation bilaterally. One instance of neglect with DSS on right.  Deep Tendon Reflexes:  2+ bilateral brachioradialis and biceps Unable to elicit patellars or achilles bilaterally in the context of edema.  Plantars: Right: Mute  Left: Mute Cerebellar: No ataxia with FNF bilaterally Gait: Deferred due to acuity of presentation  Results for orders placed or performed during the hospital encounter of 09/06/17 (from the past 48 hour(s))  I-Stat Chem 8, ED     Status: Abnormal   Collection Time: 09/06/17  3:52 PM  Result Value Ref Range   Sodium 138 135 - 145 mmol/L   Potassium 4.3 3.5 - 5.1 mmol/L   Chloride 101 101 - 111 mmol/L   BUN 30 (H) 6 - 20 mg/dL   Creatinine, Ser 1.32 (H) 0.61 - 1.24 mg/dL   Glucose, Bld 440 (H) 65 - 99 mg/dL   Calcium, Ion 1.02 (L) 1.15 - 1.40 mmol/L   TCO2 24 22 - 32 mmol/L   Hemoglobin 13.6 13.0 - 17.0 g/dL   HCT 72.5 36.6 - 44.0 %  No results found.  Assessment: 47 y.o. male presenting with stuttering speech and stated right sided weakness 1. No consistent lateralizing or localizing findings on exam. Several of the exam findings appear embellished and if secondary to actual lesions would require multiple localizations.  2. Findings mild and most likely to be nonphysiological. If secondary to an ischemic lesion, relative mildness would render risks of tPA unacceptably high relative to potential benefits.  3. Stroke Risk Factors - HTN,  DM, morbid obesity  Plan: 1. MRI brain, MRA head. If positive, admit for full stroke work up 2. Management of bilateral lower extremity edema 3. Start ASA 81 mg po qd.    @Electronically  signed: Dr. Caryl Pina 09/06/2017, 3:54 PM

## 2017-09-06 NOTE — ED Notes (Signed)
Urinal placed at bedside.

## 2017-09-06 NOTE — Progress Notes (Signed)
MRI brain completed, revealing a subacute lacunar infarction within the left thalamus; I have reviewed the images and the infarct location appears more consistent with thalamic than internal capsule.   "IMPRESSION: 1. 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 2. Multiple other remote lacunar infarcts involving the basal ganglia and thalami bilaterally. 3. Periventricular white matter changes are moderately advanced for age. 4. Remote foci of susceptibility within the basal ganglia bilaterally. 5. Constellation of findings suggests an underlying vasculitis or significant microvascular ischemic disease."  A/R: -Continue ASA -Add atorvastatin if CK is normal -Full stroke work up.  Electronically signed: Dr. Caryl Pina

## 2017-09-07 ENCOUNTER — Encounter (HOSPITAL_COMMUNITY): Payer: Self-pay

## 2017-09-07 ENCOUNTER — Observation Stay (HOSPITAL_COMMUNITY): Payer: Self-pay

## 2017-09-07 ENCOUNTER — Other Ambulatory Visit: Payer: Self-pay

## 2017-09-07 DIAGNOSIS — I639 Cerebral infarction, unspecified: Secondary | ICD-10-CM

## 2017-09-07 DIAGNOSIS — I5030 Unspecified diastolic (congestive) heart failure: Secondary | ICD-10-CM

## 2017-09-07 DIAGNOSIS — M109 Gout, unspecified: Secondary | ICD-10-CM | POA: Insufficient documentation

## 2017-09-07 LAB — CBC
HEMATOCRIT: 42 % (ref 39.0–52.0)
Hemoglobin: 13.7 g/dL (ref 13.0–17.0)
MCH: 28.4 pg (ref 26.0–34.0)
MCHC: 32.6 g/dL (ref 30.0–36.0)
MCV: 87 fL (ref 78.0–100.0)
PLATELETS: 268 10*3/uL (ref 150–400)
RBC: 4.83 MIL/uL (ref 4.22–5.81)
RDW: 13.3 % (ref 11.5–15.5)
WBC: 8.9 10*3/uL (ref 4.0–10.5)

## 2017-09-07 LAB — CBG MONITORING, ED
Glucose-Capillary: 86 mg/dL (ref 65–99)
Glucose-Capillary: 77 mg/dL (ref 65–99)
Glucose-Capillary: 97 mg/dL (ref 65–99)

## 2017-09-07 LAB — COMPREHENSIVE METABOLIC PANEL
ALBUMIN: 3 g/dL — AB (ref 3.5–5.0)
ALT: 27 U/L (ref 17–63)
AST: 27 U/L (ref 15–41)
Alkaline Phosphatase: 145 U/L — ABNORMAL HIGH (ref 38–126)
Anion gap: 9 (ref 5–15)
BILIRUBIN TOTAL: 1.7 mg/dL — AB (ref 0.3–1.2)
BUN: 30 mg/dL — AB (ref 6–20)
CO2: 31 mmol/L (ref 22–32)
CREATININE: 1.4 mg/dL — AB (ref 0.61–1.24)
Calcium: 9 mg/dL (ref 8.9–10.3)
Chloride: 101 mmol/L (ref 101–111)
GFR calc Af Amer: >60 mL/min (ref >60)
GFR calc non Af Amer: 58 mL/min — ABNORMAL LOW (ref >60)
GLUCOSE: 97 mg/dL (ref 65–99)
POTASSIUM: 4.2 mmol/L (ref 3.5–5.1)
Sodium: 141 mmol/L (ref 135–145)
Total Protein: 7.3 g/dL (ref 6.5–8.1)

## 2017-09-07 LAB — LIPID PANEL
CHOL/HDL RATIO: 4.2 ratio
CHOLESTEROL: 189 mg/dL (ref 0–200)
HDL: 45 mg/dL (ref >40)
LDL Cholesterol: 128 mg/dL — ABNORMAL HIGH (ref 0–99)
TRIGLYCERIDES: 81 mg/dL (ref <150)
VLDL: 16 mg/dL (ref 0–40)

## 2017-09-07 LAB — ECHOCARDIOGRAM COMPLETE
HEIGHTINCHES: 70 in
Weight: 4444.47 oz

## 2017-09-07 LAB — GLUCOSE, CAPILLARY
GLUCOSE-CAPILLARY: 137 mg/dL — AB (ref 65–99)
Glucose-Capillary: 90 mg/dL (ref 65–99)

## 2017-09-07 LAB — URIC ACID: Uric Acid, Serum: 10.7 mg/dL — ABNORMAL HIGH (ref 4.4–7.6)

## 2017-09-07 MED ORDER — PNEUMOCOCCAL VAC POLYVALENT 25 MCG/0.5ML IJ INJ
0.5000 mL | INJECTION | INTRAMUSCULAR | Status: AC
Start: 2017-09-08 — End: 2017-09-08
  Administered 2017-09-08: 0.5 mL via INTRAMUSCULAR
  Filled 2017-09-07: qty 0.5

## 2017-09-07 MED ORDER — ATORVASTATIN CALCIUM 40 MG PO TABS
40.0000 mg | ORAL_TABLET | Freq: Every day | ORAL | Status: DC
  Administered 2017-09-07 – 2017-09-08 (×2): 40 mg via ORAL
  Filled 2017-09-07 (×2): qty 1

## 2017-09-07 NOTE — Evaluation (Signed)
Clinical/Bedside Swallow Evaluation Patient Details  Name: Jeremiah Vance MRN: [id number] Date of Birth: 1970/04/28  Today's Date: 09/07/2017 Time: SLP Start Time (ACUTE ONLY): 1045 SLP Stop Time (ACUTE ONLY): 1052 SLP Time Calculation (min) (ACUTE ONLY): 7 min  Past Medical History:  Past Medical History:  Diagnosis Date  . Diabetes mellitus without complication (HCC)   . Hypertension   . Morbid obesity (HCC)    Past Surgical History:  Past Surgical History:  Procedure Laterality Date  . HERNIA REPAIR     HPI:  Jeremiah Vance is a 47 y.o. male presenting with word finding difficulty, slurred speech, weakness, and right sided facial numbness. MRI showing punctate infarct involving the posterior limb of L internal capsule.  Pt feels he has now returned to baseline   Assessment / Plan / Recommendation Clinical Impression  Pt presents with functional swallowing as assessed clinically. Pt tolerated all consistencies trialed with no clinical s/s of aspiration and exhibited good oral clearance of solids.  Recommend regular texture diet with thin liquids.   Pt states he speech and language function has returned to baseline.  Pt was able to participate in conversation without difficulty.  Speech is clear and free from dysarthria.  No ST needs at this time.  SLP Visit Diagnosis: Dysphagia, unspecified (R13.10)    Aspiration Risk  No limitations    Diet Recommendation Regular;Thin liquid   Liquid Administration via: Straw;Cup Medication Administration: Whole meds with liquid Supervision: Patient able to self feed    Other  Recommendations Oral Care Recommendations: Oral care BID   Follow up Recommendations None           Swallow Study   General Date of Onset: 09/06/17 HPI: Jeremiah Vance is a 47 y.o. male presenting with word finding difficulty, slurred speech, weakness, and right sided facial numbness. MRI showing punctate infarct involving the posterior limb of L internal  capsule.  Pt feels he has now returned to baseline Type of Study: Bedside Swallow Evaluation Previous Swallow Assessment: none Diet Prior to this Study: NPO Temperature Spikes Noted: No Respiratory Status: Room air History of Recent Intubation: No Behavior/Cognition: Alert;Cooperative;Pleasant mood Oral Cavity Assessment: Within Functional Limits Oral Care Completed by SLP: No Oral Cavity - Dentition: Adequate natural dentition Vision: Functional for self-feeding Self-Feeding Abilities: Able to feed self Patient Positioning: Upright in bed Baseline Vocal Quality: Normal Volitional Cough: Strong Volitional Swallow: Able to elicit    Oral/Motor/Sensory Function Overall Oral Motor/Sensory Function: Within functional limits   Thin Liquid Thin Liquid: Within functional limits Presentation: Straw    Puree Puree: Within functional limits Presentation: Self Fed   Solid   GO   Solid: Within functional limits Presentation: Self Fed        Jeremiah Greening Vane Yapp, MA, CCC-SLP 09/07/2017,12:08 PM

## 2017-09-07 NOTE — ED Notes (Signed)
Pt received lunch tray 

## 2017-09-07 NOTE — ED Notes (Signed)
Admitting MD paged about the pt CBG and remaining NPO awaiting Speech therapy consult.

## 2017-09-07 NOTE — Progress Notes (Signed)
Jeremiah Vance is a 47 y.o. male patient admitted from ED awake, alert - oriented  X 4 - no acute distress noted.  VSS - Blood pressure (!) 147/81, pulse (!) 57, temperature 98.5 F (36.9 C), temperature source Oral, resp. rate 16, height 5\' 10"  (1.778 m), weight 126 kg (277 lb 12.5 oz), SpO2 99 %.    IV in place, occlusive dsg intact without redness.  Orientation to room, and floor completed with information packet given to patient/family.  Patient declined safety video at this time.  Admission INP armband ID verified with patient/family, and in place.   SR up x 2, fall assessment complete, with patient and family able to verbalize understanding of risk associated with falls, and verbalized understanding to call nsg before up out of bed.  Call light within reach, patient able to voice, and demonstrate understanding.  Pt has abrasions on bilateral skins and MASD under his abdominal fold. No evidence of skin break down noted on exam.     Will cont to eval and treat per MD orders.  Theodosia Blender, RN 09/07/2017 3:59 PM

## 2017-09-07 NOTE — Evaluation (Signed)
Occupational Therapy Evaluation Patient Details Name: Jeremiah Vance MRN: [id number] DOB: 05-01-1970 Today's Date: 09/07/2017    History of Present Illness Pt. is a 47 y.o. M with significant PMH of systolic heart failure, type II diabetes, CKD stage 3, who presents with word finding difficulty, weakness and right sided facial numbness. MRI shows punctuate infarct involving the posterior limb of L internal capsule.   Clinical Impression   PTA, pt was independent with ADL and functional mobility. He works as a Production designer, theatre/television/film at [employer] He reports that R sided numbness and weakness have resolved. On functional assessment, pt found to have in tact sensation in R UE and generalized weakness similar to his L UE. Pt with arthritis at baseline limiting B shoulder AROM. He demonstrates decreased smoothness of tracking in all directions and had difficulty with peripheral vision in L upper quadrant today but otherwise functional use of vision. Pt able to complete LB ADL and toilet transfers with supervision. He is limited by L great toe pain as well. Pt would benefit from continued OT services while admitted to improve independence and safety with ADL and functional mobility prior to returning home. Do not anticipate need for further OT follow-up post-acute D/C.     Follow Up Recommendations  No OT follow up;Supervision/Assistance - 24 hour    Equipment Recommendations  3 in 1 bedside commode    Recommendations for Other Services       Precautions / Restrictions Precautions Precautions: Fall Restrictions Weight Bearing Restrictions: No      Mobility Bed Mobility Overal bed mobility: Modified Independent             General bed mobility comments: increased time for supine <> sit  Transfers Overall transfer level: Needs assistance Equipment used: None Transfers: Sit to/from Stand Sit to Stand: Supervision         General transfer comment: Supervision for safety.     Balance  Overall balance assessment: Needs assistance Sitting-balance support: Feet supported;No upper extremity supported Sitting balance-Leahy Scale: Good     Standing balance support: No upper extremity supported;During functional activity Standing balance-Leahy Scale: Fair           Rhomberg - Eyes Opened: 10                 ADL either performed or assessed with clinical judgement   ADL Overall ADL's : Needs assistance/impaired Eating/Feeding: Set up;Sitting   Grooming: Supervision/safety;Standing;Wash/dry hands Grooming Details (indicate cue type and reason): Able to stand at sink to wash/dry hands.  Upper Body Bathing: Set up;Sitting   Lower Body Bathing: Supervison/ safety;Sit to/from stand   Upper Body Dressing : Set up;Sitting   Lower Body Dressing: Sit to/from stand;Minimal assistance Lower Body Dressing Details (indicate cue type and reason): due to L great toe pain Toilet Transfer: Supervision/safety;Ambulation   Toileting- Clothing Manipulation and Hygiene: Supervision/safety;Sit to/from stand       Functional mobility during ADLs: Supervision/safety General ADL Comments: Pt able to complete ADL in room today with min assist for LB dressing but otherwise supervision assist. Reports numbness/tingling in RUE has resolved. Greatest limitation in L great toe pain.      Vision Baseline Vision/History: Wears glasses Wears Glasses: At all times Patient Visual Report: No change from baseline Vision Assessment?: Yes Eye Alignment: Within Functional Limits Ocular Range of Motion: Within Functional Limits Alignment/Gaze Preference: Within Defined Limits Tracking/Visual Pursuits: Able to track stimulus in all quads without difficulty;Decreased smoothness of horizontal tracking;Decreased smoothness of vertical tracking  Convergence: Within functional limits Visual Fields: Impaired-to be further tested in functional context(L upper quadrant was challenging) Diplopia  Assessment: (none reported) Additional Comments: Pt able to utilize vision functionally throughout session. With screening, pt demonstrated L upper quadrant difficulties and did have decreased smoothness of tracking in all directions.      Perception     Praxis Praxis Praxis tested?: Within functional limits    Pertinent Vitals/Pain Pain Assessment: Faces Faces Pain Scale: Hurts little more Pain Location: L great toe Pain Descriptors / Indicators: Sharp Pain Intervention(s): Limited activity within patient's tolerance;Monitored during session;Repositioned     Hand Dominance     Extremity/Trunk Assessment Upper Extremity Assessment Upper Extremity Assessment: Generalized weakness(B shoulder flexion 0-120 limited due to arthritis)   Lower Extremity Assessment Lower Extremity Assessment: Defer to PT evaluation RLE Deficits / Details: Grossly 4/5, limited by pain (arthritis at baseline) LLE Deficits / Details: Grossly 4/5, limited by pain (arthritis at baseline). Patient with acute first toe pain; RN/MD aware       Communication Communication Communication: No difficulties   Cognition Arousal/Alertness: Awake/alert Behavior During Therapy: WFL for tasks assessed/performed Overall Cognitive Status: Within Functional Limits for tasks assessed                                     General Comments  Redness noted in L foot especially in great toe region. Pt reports this is painful. R distal LE is also demonstrating redness but less severe and not as painful.     Exercises     Shoulder Instructions      Home Living Family/patient expects to be discharged to:: Private residence Living Arrangements: Spouse/significant other Available Help at Discharge: Family;Available PRN/intermittently Type of Home: Mobile home Home Access: Stairs to enter Entrance Stairs-Number of Steps: 7 Entrance Stairs-Rails: Can reach both Home Layout: One level     Bathroom  Shower/Tub: Chief Strategy Officer: Standard     Home Equipment: None          Prior Functioning/Environment Level of Independence: Independent        CommentsHydrologist at [employer]        OT Problem List: Decreased activity tolerance;Impaired balance (sitting and/or standing);Decreased safety awareness;Decreased knowledge of use of DME or AE;Decreased knowledge of precautions;Pain      OT Treatment/Interventions: Self-care/ADL training;Therapeutic exercise;Energy conservation;DME and/or AE instruction;Therapeutic activities;Patient/family education;Balance training    OT Goals(Current goals can be found in the care plan section) Acute Rehab OT Goals Patient Stated Goal: go back to work OT Goal Formulation: With patient Time For Goal Achievement: 09/21/17 Potential to Achieve Goals: Good ADL Goals Pt Will Transfer to Toilet: (P) with modified independence;ambulating;regular height toilet Pt Will Perform Toileting - Clothing Manipulation and hygiene: (P) with modified independence;sit to/from stand Pt Will Perform Tub/Shower Transfer: (P) with modified independence;ambulating;Tub transfer  OT Frequency: Min 2X/week   Barriers to D/C:            Co-evaluation              AM-PAC PT "6 Clicks" Daily Activity     Outcome Measure Help from another person eating meals?: None Help from another person taking care of personal grooming?: A Little Help from another person toileting, which includes using toliet, bedpan, or urinal?: A Little Help from another person bathing (including washing, rinsing, drying)?: A Little Help from another person to put on and  taking off regular upper body clothing?: None Help from another person to put on and taking off regular lower body clothing?: A Little 6 Click Score: 20   End of Session    Activity Tolerance: Patient tolerated treatment well Patient left: in bed;with call bell/phone within reach;with family/visitor  present(sister waiting in hallway)  OT Visit Diagnosis: Other abnormalities of gait and mobility (R26.89);Pain Pain - Right/Left: Left Pain - part of body: Ankle and joints of foot                Time: 1610-9604 OT Time Calculation (min): 22 min Charges:  OT General Charges $OT Visit: 1 Visit OT Evaluation $OT Eval Moderate Complexity: 1 Mod G-Codes:     Doristine Section, MS OTR/L  Pager: [phone]   Gordana Kewley A Jevonte Clanton 09/07/2017, 3:39 PM

## 2017-09-07 NOTE — Progress Notes (Signed)
Received report on pt.

## 2017-09-07 NOTE — Evaluation (Signed)
Physical Therapy Evaluation Patient Details Name: Jeremiah Vance MRN: [id number] DOB: 01/30/1971 Today's Date: 09/07/2017   History of Present Illness  Pt. is a 47 y.o. M with significant PMH of systolic heart failure, type II diabetes, CKD stage 3, who presents with word finding difficulty, weakness and right sided facial numbness. MRI shows punctuate infarct involving the posterior limb of L internal capsule.  Clinical Impression  Pt admitted with above diagnosis. Pt currently with functional limitations due to the deficits listed below (see PT Problem List). Patient is independent at baseline and works as a Production designer, theatre/television/film at [employer]. On PT evaluation, patient presenting with decreased functional mobility secondary to gait abnormalities, generalized weakness, and balance impairments. Think deficits may be related to new left big toe pain and chronic knee arthritic pain rather than motor impairments from stroke. Reviewed stroke signs/symptoms; patient verbalized understanding. Pt will benefit from skilled PT to increase their independence and safety with mobility to allow discharge to the venue listed below.       Follow Up Recommendations Outpatient PT;Supervision for mobility/OOB    Equipment Recommendations  3in1 (PT)    Recommendations for Other Services       Precautions / Restrictions Precautions Precautions: Fall Restrictions Weight Bearing Restrictions: No      Mobility  Bed Mobility Overal bed mobility: Modified Independent             General bed mobility comments: increased time for supine <> sit  Transfers Overall transfer level: Modified independent Equipment used: None             General transfer comment: able to don pants in standing.   Ambulation/Gait Ambulation/Gait assistance: Min guard Ambulation Distance (Feet): 200 Feet Assistive device: None Gait Pattern/deviations: Step-through pattern;Wide base of support;Decreased stance time - left Gait  velocity: decreased   General Gait Details: Patient needing to occasionally use wall rail for balance. Gait abnormalities/unsteadiness noted but think deficits may be due to arthritic pain and left toe pain rather than from stroke.  Stairs            Wheelchair Mobility    Modified Rankin (Stroke Patients Only) Modified Rankin (Stroke Patients Only) Pre-Morbid Rankin Score: No significant disability Modified Rankin: Moderately severe disability     Balance Overall balance assessment: Needs assistance Sitting-balance support: Feet supported;No upper extremity supported Sitting balance-Leahy Scale: Good     Standing balance support: No upper extremity supported;During functional activity Standing balance-Leahy Scale: Fair           Rhomberg - Eyes Opened: 10                   Pertinent Vitals/Pain Pain Assessment: No/denies pain    Home Living Family/patient expects to be discharged to:: Private residence Living Arrangements: Spouse/significant other Available Help at Discharge: Family;Available PRN/intermittently Type of Home: Mobile home Home Access: Stairs to enter Entrance Stairs-Rails: Can reach both Entrance Stairs-Number of Steps: 7 Home Layout: One level Home Equipment: None      Prior Function Level of Independence: Independent         Comments: works as Teacher, adult education        Extremity/Trunk Assessment   Upper Extremity Assessment Upper Extremity Assessment: Defer to OT evaluation    Lower Extremity Assessment Lower Extremity Assessment: RLE deficits/detail;LLE deficits/detail RLE Deficits / Details: Grossly 4/5, limited by pain (arthritis at baseline) LLE Deficits / Details: Grossly 4/5, limited by pain (arthritis at baseline). Patient  with acute first toe pain; RN/MD aware       Communication   Communication: No difficulties  Cognition Arousal/Alertness: Awake/alert Behavior During Therapy: WFL for  tasks assessed/performed Overall Cognitive Status: Within Functional Limits for tasks assessed                                        General Comments General comments (skin integrity, edema, etc.): Redness noted in distal BLE's    Exercises     Assessment/Plan    PT Assessment Patient needs continued PT services  PT Problem List Decreased strength;Decreased range of motion;Decreased activity tolerance;Decreased balance;Decreased mobility;Obesity       PT Treatment Interventions DME instruction;Gait training;Stair training;Functional mobility training;Therapeutic activities;Therapeutic exercise;Balance training;Neuromuscular re-education;Patient/family education    PT Goals (Current goals can be found in the Care Plan section)  Acute Rehab PT Goals Patient Stated Goal: go back to work PT Goal Formulation: With patient Time For Goal Achievement: 09/21/17 Potential to Achieve Goals: Good    Frequency Min 4X/week   Barriers to discharge        Co-evaluation               AM-PAC PT "6 Clicks" Daily Activity  Outcome Measure Difficulty turning over in bed (including adjusting bedclothes, sheets and blankets)?: A Little Difficulty moving from lying on back to sitting on the side of the bed? : A Little Difficulty sitting down on and standing up from a chair with arms (e.g., wheelchair, bedside commode, etc,.)?: A Little Help needed moving to and from a bed to chair (including a wheelchair)?: A Little Help needed walking in hospital room?: A Little Help needed climbing 3-5 steps with a railing? : A Lot 6 Click Score: 17    End of Session Equipment Utilized During Treatment: Gait belt Activity Tolerance: Patient tolerated treatment well Patient left: in bed;with call bell/phone within reach;with family/visitor present Nurse Communication: Mobility status PT Visit Diagnosis: Unsteadiness on feet (R26.81);Pain;Difficulty in walking, not elsewhere  classified (R26.2) Pain - Right/Left: Left Pain - part of body: Ankle and joints of foot    Time: 1594-5859 PT Time Calculation (min) (ACUTE ONLY): 26 min   Charges:   PT Evaluation $PT Eval Moderate Complexity: 1 Mod PT Treatments $Gait Training: 8-22 mins   PT G Codes:        Laurina Bustle, PT, DPT Acute Rehabilitation Services  Pager: [phone]   Vanetta Mulders 09/07/2017, 1:06 PM

## 2017-09-07 NOTE — ED Notes (Signed)
Pt aware of NPO status d/t failed Stroke Swallow Screen. Pt states he understands but feels very hungry. Pt able to speak in complete sentences w/o difficulty.

## 2017-09-07 NOTE — ED Notes (Signed)
[place] sandwich mean given to patient.

## 2017-09-07 NOTE — ED Notes (Signed)
PT in w/pt. Sister at bedside.

## 2017-09-07 NOTE — ED Notes (Signed)
250 mls clear yellow urine in urinal.

## 2017-09-07 NOTE — ED Notes (Signed)
Echo at bedside

## 2017-09-07 NOTE — Progress Notes (Signed)
*  PRELIMINARY RESULTS* Echocardiogram 2D Echocardiogram has been performed.  Stacey Drain 09/07/2017, 4:19 PM

## 2017-09-07 NOTE — ED Notes (Signed)
Check CBG 86, RN Candy informed

## 2017-09-07 NOTE — Progress Notes (Signed)
Family Medicine Teaching Service Daily Progress Note Intern Pager: [phone]  Patient name: Jeremiah Vance Medical record number: [id number] Date of birth: 1970/04/29 Age: 47 y.o. Gender: male  Primary Care Provider: Patient, No Pcp Per Consultants: Neurology Code Status: FULL  Pt Overview and Major Events to Date:  Admit 09/06/2017  Assessment and Plan: Jeremiah Vance is a 47 y.o. male presenting with word finding difficulty, weakness, and right sided facial numbness found to have punctate nonhemorrhagic infarct involving the posterior limb of L internal capsule on MRI.    Left internal capsule stroke Acute, did not receive TPA as he presented outside of window. Likely embolic in nature, non-hemorrhagic. Symptoms have largely resolved, no slurring of speech, facial droop or numbness noted this morning.  Obtaining stroke workup and Neurology following.  He received aspirin load on admission and will continue daily aspirin.  Of note, multiple remote infarcts involving basal ganglia noted on MRI head, ?vasculitis.   CK within normal range and will start atorvastatin.  Failed bedside swallow and SLP eval pending.  Lipid panel LDL 128, total chol 189, HDL 45, TG 81.   - cardiac monitoring - Neurology following appreciate their recs  - continue aspirin 81mg  daily - add atorvastatin 40 mg  - echocardiogram pending  - carotid dopplers pending - pt/ot to eval  - permissive hypertension up to 200 SBP - lovenox for dvt ppx  - npo until passes swallow study; f/u SLP eval   - vital signs per telemetry routine  Systolic heart failure Stable.  No signs of fluid overload on exam.  Patient recently admitted at [facility] on 4/11. Per notes he diuresed over 100lbs and have negative 40L over 8 days. Discharged on lisinopril 20mg , spiro 25mg  daily, coreg 6.25mg  bid, demadex 40mg . Echo on 4/12 showing 40-45% EF. BNP 230 which is down significantly from 852 on 4/11. - Continue torsemide 40mg  daily - Will  hold spiro 25mg  daily, lisinopril, and coreg for permissive htn - Will get echo per stroke workup  Type II diabetes w/ nephropathic Hgb a1c 6.4 on 4/12. On metformin at home. Will hold  Metformin while inpatient. Q4 hours glucose checks until taking PO. - NPO pending SLP eval  - q 4 hours glucose checks - could start d5 in fluids if becomes hypoglycemic   CKD stage 3a Creatinine 1.4, gfr 58. Stable from 4/18. Maintenance fluids 25mL/hr of normal saline. - daily bmp   FEN/GI: NPO until passes swallow study Prophylaxis: lovenox   Disposition: pending stroke workup  Subjective:  Pt reports feeling well this morning.  No acute overnight events.  He feels his numbness has resolved.    Objective: Temp:  [98.2 F (36.8 C)-99.1 F (37.3 C)] 99.1 F (37.3 C) (06/08 2000) Pulse Rate:  [41-65] 61 (06/09 1000) Resp:  [14-25] 21 (06/09 1000) BP: (127-173)/(63-82) 149/68 (06/09 1000) SpO2:  [90 %-100 %] 90 % (06/09 1000) Weight:  [277 lb 12.5 oz (126 kg)] 277 lb 12.5 oz (126 kg) (06/08 1636)   Physical Exam: General: alert, oriented x3, appears comfortable  Eyes: eomi, perrla. ENTM: tongue midline, uvula midline. No external signs of trauma Neck: normal ROM Cardiovascular: rrr, no m/r/g. 2+ pedal pulses Respiratory: CTAB, normal effort  MSK: normal ROM  Gastrointestinal: soft, non-tender, non-distended, +bs  Derm: excoriation and erythema in BLE  Neuro: alert ox3, no focal deficits, motor strength 5/5 bilaterally in upper and lower extremities are 5/5. Cn 2-12 intact, no uvular deviation. Psych: appropriate, pleasant  Laboratory: Recent Labs  Lab 09/06/17 1547 09/06/17 1552 09/07/17 0530  WBC 8.1  --  8.9  HGB 13.4 13.6 13.7  HCT 40.6 40.0 42.0  PLT 259  --  268   Recent Labs  Lab 09/06/17 1547 09/06/17 1552 09/07/17 0530  NA 137 138 141  K 4.3 4.3 4.2  CL 100* 101 101  CO2 26  --  31  BUN 28* 30* 30*  CREATININE 1.42* 1.40* 1.40*  CALCIUM 8.8*  --  9.0   PROT 7.1  --  7.3  BILITOT 1.4*  --  1.7*  ALKPHOS 141*  --  145*  ALT 25  --  27  AST 27  --  27  GLUCOSE 158* 156* 97    Imaging/Diagnostic Tests: Mr Brain Wo Contrast  Result Date: 09/06/2017 CLINICAL DATA:  Code stroke.  Right-sided weakness and aphasia. EXAM: MRI HEAD WITHOUT CONTRAST TECHNIQUE: Multiplanar, multiecho pulse sequences of the brain and surrounding structures were obtained without intravenous contrast. COMPARISON:  CT head without contrast of the same day. FINDINGS: Brain: The diffusion-weighted images demonstrate acute punctate nonhemorrhagic infarct involving the posterior limb of the left internal capsule. T2 signal changes are associated with this infarct. Periventricular white matter changes are moderately advanced for age. Remote lacunar infarcts are present in the basal ganglia and thalami bilaterally. The susceptibility weighted images demonstrate punctate foci of susceptibility within the basal ganglia bilaterally. The ventricles are of normal size. No significant extra-axial fluid collection is present. The internal auditory canals are within normal limits. Asymmetric white matter changes are present in the left midbrain. The cerebellum is within normal limits. Vascular: Flow is present in the major intracranial arteries. Globes and orbits are within normal limits. Skull and upper cervical spine: The skull base is within normal limits. The craniocervical junction is normal. The upper cervical spine is within normal limits. Sinuses/Orbits: The paranasal sinuses and mastoid air cells are clear. Globes and orbits are within normal limits. IMPRESSION: 1. 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 2. Multiple other remote lacunar infarcts involving the basal ganglia and thalami bilaterally. 3. Periventricular white matter changes are moderately advanced for age. 4. Remote foci of susceptibility within the basal ganglia  bilaterally. 5. Constellation of findings suggests an underlying vasculitis or significant microvascular ischemic disease. Electronically Signed   By: Marin Roberts M.D.   On: 09/06/2017 18:03   Dg Chest Port 1 View  Result Date: 09/06/2017 CLINICAL DATA:  Lightheadedness today EXAM: PORTABLE CHEST 1 VIEW COMPARISON:  July 10, 2017 FINDINGS: The heart size and mediastinal contours are stable. The heart size is enlarged. There is no focal infiltrate, pulmonary edema, or pleural effusion. The visualized skeletal structures are unremarkable. IMPRESSION: No active cardiopulmonary disease. Electronically Signed   By: Sherian Rein M.D.   On: 09/06/2017 16:53   Dg Foot 2 Views Left  Result Date: 09/06/2017 CLINICAL DATA:  Left foot pain.  Gout. EXAM: LEFT FOOT - 2 VIEW COMPARISON:  None. FINDINGS: There is no evidence of fracture or dislocation. There is no evidence of arthropathy. Prominent plantar calcaneal bone spur is noted. Peripheral vascular calcification is also seen. IMPRESSION: No acute findings. Prominent plantar calcaneal bone spur. Electronically Signed   By: Myles Rosenthal M.D.   On: 09/06/2017 20:18   Ct Head Code Stroke Wo Contrast  Result Date: 09/06/2017 CLINICAL DATA:  Code stroke. 47 year old male with right side weakness and aphasia. EXAM: CT HEAD WITHOUT CONTRAST TECHNIQUE: Contiguous  axial images were obtained from the base of the skull through the vertex without intravenous contrast. COMPARISON:  None. FINDINGS: Brain: No midline shift, ventriculomegaly, mass effect, evidence of mass lesion, intracranial hemorrhage or evidence of cortically based acute infarction. Minimal subcortical white matter hypodensity, otherwise gray-white matter differentiation is within normal limits throughout the brain. Vascular: Calcified atherosclerosis at the skull base. No suspicious intracranial vascular hyperdensity. Skull: No acute osseous abnormality identified. Hyperostosis, normal variant.  Sinuses/Orbits: Visualized paranasal sinuses and mastoids are clear. Other: Visualized orbit soft tissues are within normal limits. Visualized scalp soft tissues are within normal limits. ASPECTS [facility] Stroke Program Early CT Score) - Ganglionic level infarction (caudate, lentiform nuclei, internal capsule, insula, M1-M3 cortex): 7 - Supraganglionic infarction (M4-M6 cortex): 3 Total score (0-10 with 10 being normal): 10 IMPRESSION: 1. Normal for age noncontrast CT appearance of the brain. 2. ASPECTS is 10. 3. These results were communicated to Dr. Otelia Limes at 4:05 pmon 6/8/2019by text page via the [facility] messaging system. * Electronically Signed   By: Odessa Fleming M.D.   On: 09/06/2017 16:05    Freddrick March, MD 09/07/2017, 10:34 AM PGY-2, [facility] FPTS Intern pager: [phone], text pages welcome

## 2017-09-07 NOTE — ED Notes (Signed)
Echo completed

## 2017-09-07 NOTE — ED Notes (Signed)
Pt watching TV. No complaints. No distress noted. Will continue to monitor.

## 2017-09-07 NOTE — ED Notes (Signed)
The pt passed his speech therapy consult.

## 2017-09-07 NOTE — ED Notes (Signed)
 clear yellow urine in urinal.

## 2017-09-08 ENCOUNTER — Encounter (HOSPITAL_COMMUNITY): Payer: Self-pay

## 2017-09-08 ENCOUNTER — Inpatient Hospital Stay (HOSPITAL_COMMUNITY): Payer: Self-pay

## 2017-09-08 ENCOUNTER — Encounter (HOSPITAL_COMMUNITY): Payer: Self-pay | Admitting: *Deleted

## 2017-09-08 DIAGNOSIS — I509 Heart failure, unspecified: Secondary | ICD-10-CM

## 2017-09-08 DIAGNOSIS — I5033 Acute on chronic diastolic (congestive) heart failure: Secondary | ICD-10-CM

## 2017-09-08 DIAGNOSIS — I10 Essential (primary) hypertension: Secondary | ICD-10-CM

## 2017-09-08 DIAGNOSIS — E785 Hyperlipidemia, unspecified: Secondary | ICD-10-CM

## 2017-09-08 LAB — GLUCOSE, CAPILLARY
GLUCOSE-CAPILLARY: 131 mg/dL — AB (ref 65–99)
GLUCOSE-CAPILLARY: 103 mg/dL — AB (ref 65–99)
Glucose-Capillary: 119 mg/dL — ABNORMAL HIGH (ref 65–99)
Glucose-Capillary: 85 mg/dL (ref 65–99)
Glucose-Capillary: 88 mg/dL (ref 65–99)

## 2017-09-08 LAB — CBC
HCT: 39.6 % (ref 39.0–52.0)
Hemoglobin: 12.7 g/dL — ABNORMAL LOW (ref 13.0–17.0)
MCH: 28.5 pg (ref 26.0–34.0)
MCHC: 32.1 g/dL (ref 30.0–36.0)
MCV: 89 fL (ref 78.0–100.0)
Platelets: 247 10*3/uL (ref 150–400)
RBC: 4.45 MIL/uL (ref 4.22–5.81)
RDW: 13.3 % (ref 11.5–15.5)
WBC: 8.5 10*3/uL (ref 4.0–10.5)

## 2017-09-08 LAB — BASIC METABOLIC PANEL
Anion gap: 7 (ref 5–15)
BUN: 26 mg/dL — ABNORMAL HIGH (ref 6–20)
CO2: 28 mmol/L (ref 22–32)
Calcium: 8.5 mg/dL — ABNORMAL LOW (ref 8.9–10.3)
Chloride: 104 mmol/L (ref 101–111)
Creatinine, Ser: 1.05 mg/dL (ref 0.61–1.24)
GFR calc Af Amer: >60 mL/min (ref >60)
GFR calc non Af Amer: >60 mL/min (ref >60)
Glucose, Bld: 129 mg/dL — ABNORMAL HIGH (ref 65–99)
Potassium: 4 mmol/L (ref 3.5–5.1)
Sodium: 139 mmol/L (ref 135–145)

## 2017-09-08 LAB — HEMOGLOBIN A1C
HEMOGLOBIN A1C: 6.9 % — AB (ref 4.8–5.6)
Mean Plasma Glucose: 151.33 mg/dL

## 2017-09-08 LAB — RAPID URINE DRUG SCREEN, HOSP PERFORMED
AMPHETAMINES: NOT DETECTED
BENZODIAZEPINES: NOT DETECTED
Barbiturates: NOT DETECTED
COCAINE: NOT DETECTED
Opiates: NOT DETECTED
Tetrahydrocannabinol: NOT DETECTED

## 2017-09-08 MED ORDER — CLOPIDOGREL BISULFATE 75 MG PO TABS
75.0000 mg | ORAL_TABLET | Freq: Every day | ORAL | Status: DC
  Administered 2017-09-08: 75 mg via ORAL
  Filled 2017-09-08: qty 1

## 2017-09-08 MED ORDER — IOPAMIDOL (ISOVUE-370) INJECTION 76%
50.0000 mL | Freq: Once | INTRAVENOUS | Status: AC | PRN
  Administered 2017-09-08: 50 mL via INTRAVENOUS

## 2017-09-08 MED ORDER — IOPAMIDOL (ISOVUE-370) INJECTION 76%
INTRAVENOUS | Status: AC
  Filled 2017-09-08: qty 50

## 2017-09-08 MED ORDER — ASPIRIN 81 MG PO CHEW
81.0000 mg | CHEWABLE_TABLET | Freq: Every day | ORAL | Status: DC
  Administered 2017-09-08: 81 mg via ORAL
  Filled 2017-09-08: qty 1

## 2017-09-08 MED ORDER — ASPIRIN 81 MG PO CHEW: 81.0000 mg | CHEWABLE_TABLET | Freq: Every day | ORAL | 0 refills | Status: DC

## 2017-09-08 MED ORDER — ATORVASTATIN CALCIUM 40 MG PO TABS: 40.0000 mg | ORAL_TABLET | Freq: Every day | ORAL | 0 refills | Status: DC

## 2017-09-08 MED ORDER — CLOPIDOGREL BISULFATE 75 MG PO TABS: 75.0000 mg | ORAL_TABLET | Freq: Every day | ORAL | 0 refills | Status: DC

## 2017-09-08 NOTE — Discharge Instructions (Signed)
You are being discharged with plavix and aspirin. There are both antiplatelet medications designed to help prevent future strokes. You will take them both together for 3 weeks and then stop the plavix at that time . Please follow up with neurology in 4 weeks.

## 2017-09-08 NOTE — Progress Notes (Signed)
Nsg Discharge Note  Admit Date:  09/06/2017 Discharge date: 09/08/2017   Jeremiah Vance to be D/C'd Home   per MD order.  AVS completed.  Copy for chart, and copy for patient signed, and dated. Patient/caregiver able to verbalize understanding.  Discharge Medication: Allergies as of 09/08/2017   No Known Allergies     Medication List    TAKE these medications   acetaminophen 500 MG tablet Commonly known as:  TYLENOL Take 500 mg by mouth every 6 (six) hours as needed for mild pain or moderate pain.   aspirin 81 MG chewable tablet Chew 1 tablet (81 mg total) by mouth daily. Start taking on:  09/09/2017   atorvastatin 40 MG tablet Commonly known as:  LIPITOR Take 1 tablet (40 mg total) by mouth daily at 6 PM.   blood glucose meter kit and supplies Kit Dispense based on patient and insurance preference. Use up to four times daily as directed. (FOR ICD-9 250.00, 250.01).   carvedilol 6.25 MG tablet Commonly known as:  COREG Take 1 tablet (6.25 mg total) by mouth 2 (two) times daily.   clopidogrel 75 MG tablet Commonly known as:  PLAVIX Take 1 tablet (75 mg total) by mouth daily.   clotrimazole 1 % cream Commonly known as:  LOTRIMIN Apply topically 2 (two) times daily. Apply to suprapubic folds; keep area clean and dry   ibuprofen 200 MG tablet Commonly known as:  ADVIL,MOTRIN Take 1 tablet (200 mg total) by mouth every 8 (eight) hours as needed for mild pain or moderate pain. What changed:    how much to take  reasons to take this   lisinopril 20 MG tablet Commonly known as:  PRINIVIL,ZESTRIL Take 1 tablet (20 mg total) by mouth daily.   metFORMIN 500 MG tablet Commonly known as:  GLUCOPHAGE Take 1 tablet (500 mg total) by mouth 2 (two) times daily with a meal.   spironolactone 25 MG tablet Commonly known as:  ALDACTONE Take 1 tablet (25 mg total) by mouth daily.   torsemide 20 MG tablet Commonly known as:  DEMADEX Take 2 tablets (40 mg total) by mouth  daily.       Discharge Assessment: Vitals:   09/08/17 1359 09/08/17 1626  BP: (!) 168/80 (!) 156/76  Pulse:  (!) 51  Resp:  18  Temp:  98 F (36.7 C)  SpO2:  100%   Skin has LE scratches and MASD in groin. IV catheter discontinued intact. Site without signs and symptoms of complications - no redness or edema noted at insertion site, patient denies c/o pain - only slight tenderness at site.  Dressing with slight pressure applied.  D/c Instructions-Education: Discharge instructions given to patient/family with verbalized understanding. D/c education completed with patient/family including follow up instructions, medication list, d/c activities limitations if indicated, with other d/c instructions as indicated by MD - patient able to verbalize understanding, all questions fully answered. Patient instructed to return to ED, call 911, or call MD for any changes in condition.  Patient escorted via [place], and D/C home via private auto.  Jeremiah Vance Jeremiah Sheffield, RN 09/08/2017 6:06 PM

## 2017-09-08 NOTE — Discharge Summary (Signed)
[facility] Discharge Summary  Patient name: Jeremiah Vance record number: [id number] Date of birth: 1971-02-12 Age: 47 y.o. Gender: male Date of Admission: 09/06/2017  Date of Discharge: 09/08/2017 Admitting Physician: Zenia Resides, MD  Primary Care Provider: Patient, No Pcp Per Consultants: Neurology  Indication for Hospitalization: Stroke  Discharge Diagnoses/Problem List:  Left Internal capsule stroke Systolic heart failure Type II diabetes CKD stage IIIa  Disposition: home  Discharge Condition: stable  Discharge Exam: General:alert, oriented x3, appears comfortable  Eyes:eomi, perrla. ENTM:tongue midline, uvula midline. No external signs of trauma Neck: normal ROM Cardiovascular:rrr, no m/r/g. 2+ pedal pulses Respiratory:CTAB, normal effort  MSK: normal ROM  Gastrointestinal:soft, non-tender, non-distended, +bs  Derm:excoriation and erythema in BLE  Neuro:alert ox3, no focal deficits, motor strength 5/5bilaterally in upper and lower extremities are 5/5.Cn 2-12 intact, no uvular deviation. Psych:appropriate, pleasant  Brief Hospital Course:  47 year old was admitted on 6/8 due to slurred speech, word finding difficulty, and right facial numbness. MRI revealed right internal capsule stroke.  Right internal capsule stroke Evaluated by neurology. By the time of admission deficits had mostly resolved. He had an Echo which showed EF 60-65%, CTA head and neck which showed no areas of stenosis. Incidental finding of lung nodules were noted. Evaluated by PT/OT and PT recommended home health PT. Initially failed bedside swallow but did pass formal swallow and was advanced to regular diet which he tolerated well prior to discharge. He was discharged with plavix and aspirin for the next 3 weeks. After 3 weeks he is to stop the plavix. He was discharged on atorvastatin.  Heart failure with reduced ejection fraction Follow up echo  showed EF of 60-65%. Seems to have rebounded very nicely on medications and optimization after discharge from [facility]. No changes to medications made.  Issues for Follow Up:  1. Could consider workup for lung nodules 2. Ensure patient stops plavix after 3 weeks 3. Make sure patient follows up with neurology  Significant Procedures:   Significant Labs and Imaging:  Recent Labs  Lab 09/06/17 1547 09/06/17 1552 09/07/17 0530 09/08/17 0406  WBC 8.1  --  8.9 8.5  HGB 13.4 13.6 13.7 12.7*  HCT 40.6 40.0 42.0 39.6  PLT 259  --  268 247   Recent Labs  Lab 09/06/17 1547 09/06/17 1552 09/07/17 0530 09/08/17 0406  NA 137 138 141 139  K 4.3 4.3 4.2 4.0  CL 100* 101 101 104  CO2 26  --  31 28  GLUCOSE 158* 156* 97 129*  BUN 28* 30* 30* 26*  CREATININE 1.42* 1.40* 1.40* 1.05  CALCIUM 8.8*  --  9.0 8.5*  ALKPHOS 141*  --  145*  --   AST 27  --  27  --   ALT 25  --  27  --   ALBUMIN 3.0*  --  3.0*  --     Results/Tests Pending at Time of Discharge:   Discharge Medications:  Allergies as of 09/08/2017   No Known Allergies     Medication List    TAKE these medications   acetaminophen 500 MG tablet Commonly known as:  TYLENOL Take 500 mg by mouth every 6 (six) hours as needed for mild pain or moderate pain.   aspirin 81 MG chewable tablet Chew 1 tablet (81 mg total) by mouth daily. Start taking on:  09/09/2017   atorvastatin 40 MG tablet Commonly known as:  LIPITOR Take 1 tablet (40 mg total)  by mouth daily at 6 PM.   blood glucose meter kit and supplies Kit Dispense based on patient and insurance preference. Use up to four times daily as directed. (FOR ICD-9 250.00, 250.01).   carvedilol 6.25 MG tablet Commonly known as:  COREG Take 1 tablet (6.25 mg total) by mouth 2 (two) times daily.   clopidogrel 75 MG tablet Commonly known as:  PLAVIX Take 1 tablet (75 mg total) by mouth daily.   clotrimazole 1 % cream Commonly known as:  LOTRIMIN Apply topically 2  (two) times daily. Apply to suprapubic folds; keep area clean and dry   ibuprofen 200 MG tablet Commonly known as:  ADVIL,MOTRIN Take 1 tablet (200 mg total) by mouth every 8 (eight) hours as needed for mild pain or moderate pain. What changed:    how much to take  reasons to take this   lisinopril 20 MG tablet Commonly known as:  PRINIVIL,ZESTRIL Take 1 tablet (20 mg total) by mouth daily.   metFORMIN 500 MG tablet Commonly known as:  GLUCOPHAGE Take 1 tablet (500 mg total) by mouth 2 (two) times daily with a meal.   spironolactone 25 MG tablet Commonly known as:  ALDACTONE Take 1 tablet (25 mg total) by mouth daily.   torsemide 20 MG tablet Commonly known as:  DEMADEX Take 2 tablets (40 mg total) by mouth daily.       Discharge Instructions: Please refer to Patient Instructions section of EMR for full details.  Patient was counseled important signs and symptoms that should prompt return to medical care, changes in medications, dietary instructions, activity restrictions, and follow up appointments.   Follow-Up Appointments: Follow-up Information    [facility] Follow up in 4 week(s).   Specialty:  Neurology Why:  Stroke clinic.  Office will call with appointment date and time. Contact information: [address] [place] [phone]          Guadalupe Dawn, MD 09/08/2017, 6:57 PM PGY-1, [place]

## 2017-09-08 NOTE — Progress Notes (Addendum)
Physical Therapy Treatment Patient Details Name: Jeremiah Vance MRN: [id number] DOB: [birth year] Today's Date: 09/08/2017    History of Present Illness Pt. is a 47 y.o. M with significant PMH of systolic heart failure, type II diabetes, CKD stage 3, who presents with word finding difficulty, weakness and right sided facial numbness. MRI shows punctuate infarct involving the posterior limb of L internal capsule.    PT Comments    Today's skilled session focused on gait training with cane and stairs. Pt initially with slowed step too pattern and mild instability. Able to progress to step through pattern with improved speed and stability by end of session. Cues for technqiue and sequencing throughout. Will continue to follow acutely.   Follow Up Recommendations  Outpatient PT;Supervision for mobility/OOB     Equipment Recommendations  3in1 (PT);Cane    Recommendations for Other Services       Precautions / Restrictions Precautions Precautions: Fall Restrictions Weight Bearing Restrictions: No    Mobility  Bed Mobility Overal bed mobility: Modified Independent             General bed mobility comments: increased time for supine <> sit  Transfers Overall transfer level: Needs assistance Equipment used: Straight cane Transfers: Sit to/from Stand Sit to Stand: Supervision         General transfer comment: Supervision for safety.   Ambulation/Gait Ambulation/Gait assistance: Min guard Ambulation Distance (Feet): 300 Feet Assistive device: Straight cane Gait Pattern/deviations: Step-through pattern;Wide base of support;Decreased stance time - left;Step-to pattern Gait velocity: decreased   General Gait Details: Provided pt with demonstration of correct technique with straight cane.  Pt initially with slowed step too pattern and mild instability. Able to progress to step through pattern with improved speed and stability by end of session. Cues for technqiue and  sequencing throughout.   Stairs Stairs: Yes Stairs assistance: Min guard Stair Management: Two rails;Step to pattern;Forwards Number of Stairs: 4 General stair comments: min guard for safety and cues for sequencing   Wheelchair Mobility    Modified Rankin (Stroke Patients Only) Modified Rankin (Stroke Patients Only) Pre-Morbid Rankin Score: No significant disability Modified Rankin: Moderately severe disability     Balance Overall balance assessment: Needs assistance Sitting-balance support: Feet supported;No upper extremity supported Sitting balance-Leahy Scale: Good     Standing balance support: No upper extremity supported;During functional activity Standing balance-Leahy Scale: Fair                              Cognition Arousal/Alertness: Awake/alert Behavior During Therapy: WFL for tasks assessed/performed Overall Cognitive Status: Within Functional Limits for tasks assessed                                        Exercises      General Comments General comments (skin integrity, edema, etc.): sister present for session      Pertinent Vitals/Pain Pain Assessment: Faces Faces Pain Scale: Hurts little more Pain Location: L great toe Pain Descriptors / Indicators: Sharp Pain Intervention(s): Monitored during session;Limited activity within patient's tolerance;Repositioned    Home Living                      Prior Function            PT Goals (current goals can now be found in the care plan section)  Acute Rehab PT Goals Patient Stated Goal: go back to work PT Goal Formulation: With patient Time For Goal Achievement: 09/21/17 Potential to Achieve Goals: Good Progress towards PT goals: Progressing toward goals    Frequency    Min 4X/week      PT Plan Current plan remains appropriate;Equipment recommendations need to be updated    Co-evaluation              AM-PAC PT "6 Clicks" Daily Activity   Outcome Measure  Difficulty turning over in bed (including adjusting bedclothes, sheets and blankets)?: None Difficulty moving from lying on back to sitting on the side of the bed? : None Difficulty sitting down on and standing up from a chair with arms (e.g., wheelchair, bedside commode, etc,.)?: A Little Help needed moving to and from a bed to chair (including a wheelchair)?: A Little Help needed walking in hospital room?: A Little Help needed climbing 3-5 steps with a railing? : A Little 6 Click Score: 20    End of Session Equipment Utilized During Treatment: Gait belt Activity Tolerance: Patient tolerated treatment well Patient left: with call bell/phone within reach;with family/visitor present;in chair Nurse Communication: Mobility status PT Visit Diagnosis: Unsteadiness on feet (R26.81);Pain;Difficulty in walking, not elsewhere classified (R26.2) Pain - Right/Left: Left Pain - part of body: Ankle and joints of foot     Time: 1003-1031 PT Time Calculation (min) (ACUTE ONLY): 28 min  Charges:  $Gait Training: 23-37 mins                    G Codes:      Kallie Locks, [place] Pager [phone] Acute Rehab   Sheral Apley 09/08/2017, 1:55 PM

## 2017-09-08 NOTE — Progress Notes (Addendum)
STROKE TEAM PROGRESS NOTE   INTERVAL HISTORY His sister is at the bedside.  He recounted HPI with Dr. Pearlean Brownie.  He remained stable overnight.  Stroke work-up currently underway.  Vitals:   09/08/17 0010 09/08/17 0408 09/08/17 0500 09/08/17 0814  BP: (!) 155/79 138/76  (!) 152/80  Pulse: (!) 58 60  (!) 55  Resp: 16 16  18   Temp: 98.2 F (36.8 C) 97.8 F (36.6 C)  98.1 F (36.7 C)  TempSrc: Oral Oral  Oral  SpO2: 100% 99%  100%  Weight:   124 kg (273 lb 5.9 oz)   Height:        CBC:  Recent Labs  Lab 09/06/17 1547  09/07/17 0530 09/08/17 0406  WBC 8.1  --  8.9 8.5  NEUTROABS 5.1  --   --   --   HGB 13.4   < > 13.7 12.7*  HCT 40.6   < > 42.0 39.6  MCV 86.4  --  87.0 89.0  PLT 259  --  268 247   < > = values in this interval not displayed.    Basic Metabolic Panel:  Recent Labs  Lab 09/07/17 0530 09/08/17 0406  NA 141 139  K 4.2 4.0  CL 101 104  CO2 31 28  GLUCOSE 97 129*  BUN 30* 26*  CREATININE 1.40* 1.05  CALCIUM 9.0 8.5*   Lipid Panel:     Component Value Date/Time   CHOL 189 09/07/2017 0530   TRIG 81 09/07/2017 0530   HDL 45 09/07/2017 0530   CHOLHDL 4.2 09/07/2017 0530   VLDL 16 09/07/2017 0530   LDLCALC 128 (H) 09/07/2017 0530   HgbA1c:  Lab Results  Component Value Date   HGBA1C 6.4 (H) 07/11/2017   Urine Drug Screen: No results found for: LABOPIA, COCAINSCRNUR, LABBENZ, AMPHETMU, THCU, LABBARB  Alcohol Level No results found for: [facility]  IMAGING Mr Brain Wo Contrast  Result Date: 09/06/2017 CLINICAL DATA:  Code stroke.  Right-sided weakness and aphasia. EXAM: MRI HEAD WITHOUT CONTRAST TECHNIQUE: Multiplanar, multiecho pulse sequences of the brain and surrounding structures were obtained without intravenous contrast. COMPARISON:  CT head without contrast of the same day. FINDINGS: Brain: The diffusion-weighted images demonstrate acute punctate nonhemorrhagic infarct involving the posterior limb of the left internal capsule. T2 signal changes are  associated with this infarct. Periventricular white matter changes are moderately advanced for age. Remote lacunar infarcts are present in the basal ganglia and thalami bilaterally. The susceptibility weighted images demonstrate punctate foci of susceptibility within the basal ganglia bilaterally. The ventricles are of normal size. No significant extra-axial fluid collection is present. The internal auditory canals are within normal limits. Asymmetric white matter changes are present in the left midbrain. The cerebellum is within normal limits. Vascular: Flow is present in the major intracranial arteries. Globes and orbits are within normal limits. Skull and upper cervical spine: The skull base is within normal limits. The craniocervical junction is normal. The upper cervical spine is within normal limits. Sinuses/Orbits: The paranasal sinuses and mastoid air cells are clear. Globes and orbits are within normal limits. IMPRESSION: 1. 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 2. Multiple other remote lacunar infarcts involving the basal ganglia and thalami bilaterally. 3. Periventricular white matter changes are moderately advanced for age. 4. Remote foci of susceptibility within the basal ganglia bilaterally. 5. Constellation of findings suggests an underlying vasculitis or significant microvascular ischemic disease. Electronically Signed   By: Cristal Deer  Mattern M.D.   On: 09/06/2017 18:03   Dg Chest Port 1 View  Result Date: 09/06/2017 CLINICAL DATA:  Lightheadedness today EXAM: PORTABLE CHEST 1 VIEW COMPARISON:  July 10, 2017 FINDINGS: The heart size and mediastinal contours are stable. The heart size is enlarged. There is no focal infiltrate, pulmonary edema, or pleural effusion. The visualized skeletal structures are unremarkable. IMPRESSION: No active cardiopulmonary disease. Electronically Signed   By: Sherian Rein M.D.   On: 09/06/2017 16:53    Dg Foot 2 Views Left  Result Date: 09/06/2017 CLINICAL DATA:  Left foot pain.  Gout. EXAM: LEFT FOOT - 2 VIEW COMPARISON:  None. FINDINGS: There is no evidence of fracture or dislocation. There is no evidence of arthropathy. Prominent plantar calcaneal bone spur is noted. Peripheral vascular calcification is also seen. IMPRESSION: No acute findings. Prominent plantar calcaneal bone spur. Electronically Signed   By: Myles Rosenthal M.D.   On: 09/06/2017 20:18   Ct Head Code Stroke Wo Contrast  Result Date: 09/06/2017 CLINICAL DATA:  Code stroke. 47 year old male with right side weakness and aphasia. EXAM: CT HEAD WITHOUT CONTRAST TECHNIQUE: Contiguous axial images were obtained from the base of the skull through the vertex without intravenous contrast. COMPARISON:  None. FINDINGS: Brain: No midline shift, ventriculomegaly, mass effect, evidence of mass lesion, intracranial hemorrhage or evidence of cortically based acute infarction. Minimal subcortical white matter hypodensity, otherwise gray-white matter differentiation is within normal limits throughout the brain. Vascular: Calcified atherosclerosis at the skull base. No suspicious intracranial vascular hyperdensity. Skull: No acute osseous abnormality identified. Hyperostosis, normal variant. Sinuses/Orbits: Visualized paranasal sinuses and mastoids are clear. Other: Visualized orbit soft tissues are within normal limits. Visualized scalp soft tissues are within normal limits. ASPECTS [facility] Stroke Program Early CT Score) - Ganglionic level infarction (caudate, lentiform nuclei, internal capsule, insula, M1-M3 cortex): 7 - Supraganglionic infarction (M4-M6 cortex): 3 Total score (0-10 with 10 being normal): 10 IMPRESSION: 1. Normal for age noncontrast CT appearance of the brain. 2. ASPECTS is 10. 3. These results were communicated to Dr. Otelia Limes at 4:05 pmon 6/8/2019by text page via the [facility] messaging system. * Electronically Signed   By: Odessa Fleming M.D.   On:  09/06/2017 16:05   2D Echocardiogram  - Left ventricle: The cavity size was moderately dilated. Wall thickness was increased in a pattern of mild LVH. Systolic function was normal. The estimated ejection fraction was in the range of 60% to 65%. - Mitral valve: Calcified annulus. Mildly thickened leaflets . Valve area by pressure half-time: 1.4 cm^2.  PHYSICAL EXAM   Pleasant middle-age Caucasian male currently not in distress. HEENT: [place]/AT Lungs: Respirations unlabored Ext: Chronic severe edema distal lower extremities bilaterally with thickened skin and rubor.   Neurologic Examination: Mental Status: Alert, oriented, thought content appropriate. Odd affect. Speech fluent without evidence of aphasia; stuttering quality is most consistent with non-physiological origin. Able to follow all commands, name all objects and repeat a phrase. Cranial Nerves: II:  Visual fields full.  PERRL.  III,IV, VI: No ptosis. EOM are full.  Eyes slightly dysconjugate c/w exotropia (OS lower plane). No nystagmus.  V,VII: Smile symmetric, facial light touch sensation decreased on the right  VIII: Hearing intact to voice  IX,X: No hypophonia XI: Symmetric XII: No lingual dysarthria Motor: RUE: 5/5  LUE: 5/5 No pronator drift on either side but diminished fine finger movements on the right. Orbits left over right upper extremity. Tone normal bilat upper ext Bilateral lower ext able to elevate antigravity  x 5 sec, giveway on right due to chronic knee pain RLE not externally rotated.  Tone normal bilat LE Poor cooperation with LE exam due to stated pain in right knee, dorsum of left foot and left toe. Sensory: Intact FT and temp sensation bilaterally. One instance of neglect with DSS on right.  Plantars: Right: Mute             Left: Mute Cerebellar: No ataxia with FNF bilaterally Gait: Deferred due to acuity of presentation   ASSESSMENT/PLAN Jeremiah Vance is a 47 y.o. male with history of  diabetes, hypertension, morbid obesity presenting with right hemiparesis and stuttering speech.   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 ASPECTS 10.     MRI punctate L PLI C, old B basal ganglia and thalami lacunes, advanced WMD, possible vasculitis or sign small vessel disease.  CTA head & neck pending   Carotid doppler - canceled given CTA neck added  2D Echo  EF 60-65%. No source of embolus   LDL 128  HgbA1c 6.9  UDS / ETOH level negative   Lovenox 40 mg sq daily for VTE prophylaxis  No antithrombotic prior to admission, now on aspirin 81 mg daily. Given mild stroke, recommend aspirin 81 mg and plavix 75 mg daily x 3 weeks, then aspirin alone.   Therapy recommendations:  OP PT, no OT  Disposition:  pending   Follow-up stroke clinic in 4 weeks.  Order placed.  Hypertension  Stable . Permissive hypertension (OK if < 220/120) but gradually normalize in 5-7 days . Long-term BP goal normotensive  Hyperlipidemia  Home meds:  No statin  LDL 128, goal < 70  Current statin:  Lipitor 40  Continue statin at discharge  Diabetes type II  HgbA1c 6.9, goal < 7.0  Controlled  Other Stroke Risk Factors  Obesity, Body mass index is 39.22 kg/m., recommend weight loss, diet and exercise as appropriate   Other Active Problems  Systolic heart failure  CKD stage III  Hospital day # 1  Annie Main, MSN, APRN, ANVP-BC, AGPCNP-BC Advanced Practice Stroke Nurse [facility] See Amion for Schedule & Pager information 09/08/2017 2:21 PM  I have personally examined this patient, reviewed notes, independently viewed imaging studies, participated in medical decision making and plan of care.ROS completed by me personally and pertinent positives fully documented  I have made any additions or clarifications directly to the above note. Agree with note above. He has presented with a small left  internal capsule infarct due to lacunar disease. I have strongly counseled him to quit smoking completely and to maintain aggressive risk factor modification.Check CT angiogram of the brain and neck. Dual antiplatelet therapy for 3 weeks followed by aspirin alone. Long discussion with patient and wife and answered questions. Greater than 50% time during this 35 minute visit was spent on counseling and coordination of care about his lacunar stroke in discussion about smoking cessation and aggressive risk factor modification for stroke prevention.  Jeremiah Heady, MD Medical Director [facility] Pager: [phone] 09/08/2017 5:33 PM  To contact Stroke Continuity provider, please refer to [email or web address]. After hours, contact General Neurology

## 2017-09-09 ENCOUNTER — Encounter: Payer: Self-pay | Admitting: Cardiology

## 2017-09-09 NOTE — Progress Notes (Signed)
Cardiology Office Note  Date: 09/10/2017   ID: DEQUAN Vance, DOB 01-09-1971, MRN [id number]  PCP: Patient, No Pcp Per  Consulting Cardiologist: Rozann Lesches, MD   Chief Complaint  Patient presents with  . History of cardiomyopathy    History of Present Illness: Jeremiah Vance is a 47 y.o. male originally referred for cardiology consultation by Dr. Carmelina Noun back in April for evaluation of cardiomyopathy.  He was found to have an LVEF of 40 to 45% at that time with marked volume overload requiring IV diuresis.  Just recently he was hospitalized with a small left internal capsule lacunar stroke, managed by neurology with antiplatelet regimen and statin.  He had no intracardiac source of thrombus, in fact LVEF had improved to the range of 60 to 65% on follow-up testing.  No cardiac arrhythmias were documented.  He presents with his sister today.  States he feels better overall.  He does not report any chest pain or worsening shortness of breath, and states that his leg edema has been slowly improving.  Apparently, this was fairly severe and became gradually worse over the last 6 months.  We went over the results of his most recent echocardiogram which were reassuring.  He states that he has been taking his medications regularly as outlined below.  Reviewed his recent lab work.  He is looking to establish with a PCP in this area.  Past Medical History:  Diagnosis Date  . CKD (chronic kidney disease) stage 3, GFR 30-59 ml/min (HCC)   . History of cardiomyopathy    LVEF 40 to 45% in April 2019 - subsequently normalized  . Hypertension   . Ischemic stroke ([place])    Small left internal capsule infarct due to lacunar disease  . Morbid obesity ([place])   . Type 2 diabetes mellitus ([place])     Past Surgical History:  Procedure Laterality Date  . HERNIA REPAIR      Current Outpatient Medications  Medication Sig Dispense Refill  . acetaminophen (TYLENOL) 500 MG tablet Take 500 mg by  mouth every 6 (six) hours as needed for mild pain or moderate pain.    [name] Kitchen aspirin 81 MG chewable tablet Chew 1 tablet (81 mg total) by mouth daily. 30 tablet 0  . atorvastatin (LIPITOR) 40 MG tablet Take 1 tablet (40 mg total) by mouth daily at 6 PM. 30 tablet 0  . blood glucose meter kit and supplies KIT Dispense based on patient and insurance preference. Use up to four times daily as directed. (FOR ICD-9 250.00, 250.01). 1 each 0  . carvedilol (COREG) 6.25 MG tablet Take 1 tablet (6.25 mg total) by mouth 2 (two) times daily. 60 tablet 3  . clopidogrel (PLAVIX) 75 MG tablet Take 1 tablet (75 mg total) by mouth daily. 30 tablet 0  . clotrimazole (LOTRIMIN) 1 % cream Apply topically 2 (two) times daily. Apply to suprapubic folds; keep area clean and dry 30 g 0  . ibuprofen (ADVIL,MOTRIN) 200 MG tablet Take 1 tablet (200 mg total) by mouth every 8 (eight) hours as needed for mild pain or moderate pain. (Patient taking differently: Take 400-600 mg by mouth every 8 (eight) hours as needed for mild pain (for knee pain). )    . lisinopril (PRINIVIL,ZESTRIL) 20 MG tablet Take 1 tablet (20 mg total) by mouth daily. 30 tablet 3  . metFORMIN (GLUCOPHAGE) 500 MG tablet Take 1 tablet (500 mg total) by mouth 2 (two) times daily with a meal. 60  tablet 2  . spironolactone (ALDACTONE) 25 MG tablet Take 1 tablet (25 mg total) by mouth daily. 30 tablet 3  . torsemide (DEMADEX) 20 MG tablet Take 2 tablets (40 mg total) by mouth daily. 60 tablet 4   No current facility-administered medications for this visit.    Allergies:  Patient has no known allergies.   Social History: The patient  reports that he has never smoked. He has never used smokeless tobacco. He reports that he does not drink alcohol.   Family History: The patient's family history includes Diabetes in his father, maternal grandmother, mother, and sister; Heart disease in his father, maternal grandmother, and paternal grandfather; Stroke in his mother and  paternal grandfather.   ROS:  Please see the history of present illness. Otherwise, complete review of systems is positive for leg swelling.  All other systems are reviewed and negative.   Physical Exam: VS:  BP 116/60   Pulse 66   Ht _0  (1.803 m)   Wt 272 lb 3.2 oz (123.5 kg)   SpO2 98%   BMI 37.96 kg/m , BMI Body mass index is 37.96 kg/m.  Wt Readings from Last 3 Encounters:  09/10/17 272 lb 3.2 oz (123.5 kg)  09/08/17 273 lb 5.9 oz (124 kg)  07/18/17 284 lb (128.8 kg)    General: Patient appears comfortable at rest. HEENT: Conjunctiva and lids normal, oropharynx clear. Neck: Supple, no elevated JVP or carotid bruits, no thyromegaly. Lungs: Clear to auscultation, nonlabored breathing at rest. Cardiac: Regular rate and rhythm, no S3 or significant systolic murmur, no pericardial rub. Abdomen: Obese, nontender, bowel sounds present, no guarding or rebound. Extremities: Chronic appearing bilateral leg edema and venous stasis with some lymphedema, distal pulses 1+. Skin: Warm and dry. Musculoskeletal: No kyphosis. Neuropsychiatric: Alert and oriented x3, affect grossly appropriate.  ECG: I personally reviewed the tracing from 09/07/2017 which showed sinus rhythm with possible left atrial enlargement and IVCD.  Recent Labwork: 07/13/2017: Magnesium 2.0 09/06/2017: B Natriuretic Peptide 229.4 09/07/2017: ALT 27; AST 27 09/08/2017: BUN 26; Creatinine, Ser 1.05; Hemoglobin 12.7; Platelets 247; Potassium 4.0; Sodium 139     Component Value Date/Time   CHOL 189 09/07/2017 0530   TRIG 81 09/07/2017 0530   HDL 45 09/07/2017 0530   CHOLHDL 4.2 09/07/2017 0530   VLDL 16 09/07/2017 0530   LDLCALC 128 (H) 09/07/2017 0530    Other Studies Reviewed Today:  Echocardiogram 09/07/2017: Study Conclusions  - Left ventricle: The cavity size was moderately dilated. Wall   thickness was increased in a pattern of mild LVH. Systolic   function was normal. The estimated ejection fraction was in  the   range of 60% to 65%. - Mitral valve: Calcified annulus. Mildly thickened leaflets .   Valve area by pressure half-time: 1.4 cm^2.  Assessment and Plan:  1.  History of cardiomyopathy, LVEF 40 to 45% as of April although with subsequent improvement to 60 to 65% range on medical therapy.  Would be most suspicious of a nonischemic etiology.  Plan is to continue present regimen, I reviewed his recent lab work.  He continues to have leg swelling, but he states that this is getting gradually better.  Refill provided for same dose of Demadex.  We will obtain a BMET for his next follow-up visit.  2.  Health maintenance, referral being made to PCP in the area.  3.  Status post recent small left internal capsule lacunar stroke, follow-up planned per neurology.  He is on antiplatelet  regimen and statin.  4.  Essential hypertension, blood pressure is well controlled today on current regimen.  5.  Type 2 diabetes mellitus, currently on Glucophage.  Recent hemoglobin A1c 6.9.  Needs to establish with PCP.  Current medicines were reviewed with the patient today.   Orders Placed This Encounter  Procedures  . Basic metabolic panel    Disposition: Follow-up in 4 months.  Signed, Satira Sark, MD, [facility] 09/10/2017 3:05 PM    [facility] at [facility] [address] [place], [place] [address] Phone: [phone]; Fax: [phone]

## 2017-09-10 ENCOUNTER — Encounter (INDEPENDENT_AMBULATORY_CARE_PROVIDER_SITE_OTHER): Payer: Self-pay

## 2017-09-10 ENCOUNTER — Encounter

## 2017-09-10 ENCOUNTER — Ambulatory Visit (INDEPENDENT_AMBULATORY_CARE_PROVIDER_SITE_OTHER): Payer: Self-pay | Admitting: Cardiology

## 2017-09-10 ENCOUNTER — Encounter: Payer: Self-pay | Admitting: Cardiology

## 2017-09-10 VITALS — BP 116/60 | HR 66 | Ht 71.0 in | Wt 272.2 lb

## 2017-09-10 DIAGNOSIS — Z8679 Personal history of other diseases of the circulatory system: Secondary | ICD-10-CM

## 2017-09-10 DIAGNOSIS — I10 Essential (primary) hypertension: Secondary | ICD-10-CM

## 2017-09-10 DIAGNOSIS — E1165 Type 2 diabetes mellitus with hyperglycemia: Secondary | ICD-10-CM

## 2017-09-10 DIAGNOSIS — I6381 Other cerebral infarction due to occlusion or stenosis of small artery: Secondary | ICD-10-CM

## 2017-09-10 MED ORDER — TORSEMIDE 20 MG PO TABS: 40.0000 mg | ORAL_TABLET | Freq: Every day | ORAL | 4 refills | Status: DC

## 2017-09-10 NOTE — Patient Instructions (Addendum)
Medication Instructions:   Your physician recommends that you continue on your current medications as directed. Please refer to the Current Medication list given to you today.  Labwork:  Your physician recommends that you return for lab work in: 4 months just before your next visit to check your BMET.  Testing/Procedures:    Follow-Up:  Your physician recommends that you schedule a follow-up appointment in: 4 months. You will receive a reminder letter in the mail in about 2 months reminding you to call and schedule your appointment. If you don't receive this letter, please contact our office.  Any Other Special Instructions Will Be Listed Below (If Applicable).  Your physician recommends that you establish with a primary care doctor. (Dr. Tracie Harrier)  If you need a refill on your cardiac medications before your next appointment, please call your pharmacy.

## 2017-09-14 ENCOUNTER — Encounter (HOSPITAL_COMMUNITY): Payer: Self-pay | Admitting: Radiology

## 2017-09-14 ENCOUNTER — Inpatient Hospital Stay (HOSPITAL_COMMUNITY)
Admission: EM | Admit: 2017-09-14 | Discharge: 2017-09-17 | DRG: 065 | Disposition: A | Discharge status: Home Health | Payer: Self-pay | Attending: Family Medicine | Admitting: Family Medicine

## 2017-09-14 ENCOUNTER — Emergency Department (HOSPITAL_COMMUNITY): Payer: Self-pay

## 2017-09-14 DIAGNOSIS — Z7984 Long term (current) use of oral hypoglycemic drugs: Secondary | ICD-10-CM

## 2017-09-14 DIAGNOSIS — R531 Weakness: Secondary | ICD-10-CM

## 2017-09-14 DIAGNOSIS — R29810 Facial weakness: Secondary | ICD-10-CM | POA: Diagnosis present

## 2017-09-14 DIAGNOSIS — I5032 Chronic diastolic (congestive) heart failure: Secondary | ICD-10-CM | POA: Diagnosis present

## 2017-09-14 DIAGNOSIS — Z8249 Family history of ischemic heart disease and other diseases of the circulatory system: Secondary | ICD-10-CM

## 2017-09-14 DIAGNOSIS — Z7982 Long term (current) use of aspirin: Secondary | ICD-10-CM

## 2017-09-14 DIAGNOSIS — N179 Acute kidney failure, unspecified: Secondary | ICD-10-CM | POA: Diagnosis present

## 2017-09-14 DIAGNOSIS — R4789 Other speech disturbances: Secondary | ICD-10-CM

## 2017-09-14 DIAGNOSIS — E1151 Type 2 diabetes mellitus with diabetic peripheral angiopathy without gangrene: Secondary | ICD-10-CM | POA: Diagnosis present

## 2017-09-14 DIAGNOSIS — R29705 NIHSS score 5: Secondary | ICD-10-CM | POA: Diagnosis present

## 2017-09-14 DIAGNOSIS — Z833 Family history of diabetes mellitus: Secondary | ICD-10-CM

## 2017-09-14 DIAGNOSIS — R402142 Coma scale, eyes open, spontaneous, at arrival to emergency department: Secondary | ICD-10-CM | POA: Diagnosis present

## 2017-09-14 DIAGNOSIS — M1711 Unilateral primary osteoarthritis, right knee: Secondary | ICD-10-CM | POA: Diagnosis present

## 2017-09-14 DIAGNOSIS — I428 Other cardiomyopathies: Secondary | ICD-10-CM | POA: Diagnosis present

## 2017-09-14 DIAGNOSIS — E785 Hyperlipidemia, unspecified: Secondary | ICD-10-CM | POA: Diagnosis present

## 2017-09-14 DIAGNOSIS — N183 Chronic kidney disease, stage 3 (moderate): Secondary | ICD-10-CM | POA: Diagnosis present

## 2017-09-14 DIAGNOSIS — I639 Cerebral infarction, unspecified: Principal | ICD-10-CM | POA: Diagnosis present

## 2017-09-14 DIAGNOSIS — R4781 Slurred speech: Secondary | ICD-10-CM | POA: Diagnosis present

## 2017-09-14 DIAGNOSIS — M109 Gout, unspecified: Secondary | ICD-10-CM | POA: Diagnosis present

## 2017-09-14 DIAGNOSIS — E1122 Type 2 diabetes mellitus with diabetic chronic kidney disease: Secondary | ICD-10-CM | POA: Diagnosis present

## 2017-09-14 DIAGNOSIS — G8191 Hemiplegia, unspecified affecting right dominant side: Secondary | ICD-10-CM | POA: Diagnosis present

## 2017-09-14 DIAGNOSIS — R402362 Coma scale, best motor response, obeys commands, at arrival to emergency department: Secondary | ICD-10-CM | POA: Diagnosis present

## 2017-09-14 DIAGNOSIS — I130 Hypertensive heart and chronic kidney disease with heart failure and stage 1 through stage 4 chronic kidney disease, or unspecified chronic kidney disease: Secondary | ICD-10-CM | POA: Diagnosis present

## 2017-09-14 DIAGNOSIS — I10 Essential (primary) hypertension: Secondary | ICD-10-CM | POA: Diagnosis present

## 2017-09-14 DIAGNOSIS — R2990 Unspecified symptoms and signs involving the nervous system: Secondary | ICD-10-CM

## 2017-09-14 DIAGNOSIS — Z823 Family history of stroke: Secondary | ICD-10-CM

## 2017-09-14 DIAGNOSIS — R402252 Coma scale, best verbal response, oriented, at arrival to emergency department: Secondary | ICD-10-CM | POA: Diagnosis present

## 2017-09-14 DIAGNOSIS — F17210 Nicotine dependence, cigarettes, uncomplicated: Secondary | ICD-10-CM | POA: Diagnosis present

## 2017-09-14 DIAGNOSIS — E1165 Type 2 diabetes mellitus with hyperglycemia: Secondary | ICD-10-CM

## 2017-09-14 DIAGNOSIS — Z7902 Long term (current) use of antithrombotics/antiplatelets: Secondary | ICD-10-CM

## 2017-09-14 LAB — APTT: aPTT: 28 seconds (ref 24–36)

## 2017-09-14 LAB — I-STAT CHEM 8, ED
BUN: 56 mg/dL — ABNORMAL HIGH (ref 6–20)
CALCIUM ION: 0.97 mmol/L — AB (ref 1.15–1.40)
CHLORIDE: 99 mmol/L — AB (ref 101–111)
CREATININE: 2.6 mg/dL — AB (ref 0.61–1.24)
GLUCOSE: 137 mg/dL — AB (ref 65–99)
HCT: 41 % (ref 39.0–52.0)
Hemoglobin: 13.9 g/dL (ref 13.0–17.0)
POTASSIUM: 4 mmol/L (ref 3.5–5.1)
Sodium: 136 mmol/L (ref 135–145)
TCO2: 23 mmol/L (ref 22–32)

## 2017-09-14 LAB — COMPREHENSIVE METABOLIC PANEL
ALK PHOS: 125 U/L (ref 38–126)
ALT: 23 U/L (ref 17–63)
ANION GAP: 14 (ref 5–15)
AST: 27 U/L (ref 15–41)
Albumin: 3.4 g/dL — ABNORMAL LOW (ref 3.5–5.0)
BILIRUBIN TOTAL: 1.2 mg/dL (ref 0.3–1.2)
BUN: 63 mg/dL — ABNORMAL HIGH (ref 6–20)
CALCIUM: 9 mg/dL (ref 8.9–10.3)
CO2: 24 mmol/L (ref 22–32)
CREATININE: 2.49 mg/dL — AB (ref 0.61–1.24)
Chloride: 99 mmol/L — ABNORMAL LOW (ref 101–111)
GFR calc non Af Amer: 29 mL/min — ABNORMAL LOW (ref >60)
GFR, EST AFRICAN AMERICAN: 34 mL/min — AB (ref >60)
Glucose, Bld: 143 mg/dL — ABNORMAL HIGH (ref 65–99)
Potassium: 4.1 mmol/L (ref 3.5–5.1)
SODIUM: 137 mmol/L (ref 135–145)
TOTAL PROTEIN: 8 g/dL (ref 6.5–8.1)

## 2017-09-14 LAB — CBC
HEMATOCRIT: 42.7 % (ref 39.0–52.0)
HEMOGLOBIN: 13.6 g/dL (ref 13.0–17.0)
MCH: 28.5 pg (ref 26.0–34.0)
MCHC: 31.9 g/dL (ref 30.0–36.0)
MCV: 89.5 fL (ref 78.0–100.0)
Platelets: 285 10*3/uL (ref 150–400)
RBC: 4.77 MIL/uL (ref 4.22–5.81)
RDW: 13.2 % (ref 11.5–15.5)
WBC: 8.1 10*3/uL (ref 4.0–10.5)

## 2017-09-14 LAB — DIFFERENTIAL
Abs Immature Granulocytes: 0 10*3/uL (ref 0.0–0.1)
BASOS ABS: 0.1 10*3/uL (ref 0.0–0.1)
Basophils Relative: 1 %
EOS PCT: 3 %
Eosinophils Absolute: 0.3 10*3/uL (ref 0.0–0.7)
Immature Granulocytes: 0 %
LYMPHS ABS: 2.3 10*3/uL (ref 0.7–4.0)
LYMPHS PCT: 28 %
Monocytes Absolute: 0.8 10*3/uL (ref 0.1–1.0)
Monocytes Relative: 10 %
NEUTROS ABS: 4.7 10*3/uL (ref 1.7–7.7)
Neutrophils Relative %: 58 %

## 2017-09-14 LAB — GLUCOSE, CAPILLARY
GLUCOSE-CAPILLARY: 122 mg/dL — AB (ref 65–99)
Glucose-Capillary: 83 mg/dL (ref 65–99)

## 2017-09-14 LAB — I-STAT TROPONIN, ED: Troponin i, poc: 0.02 ng/mL (ref 0.00–0.08)

## 2017-09-14 LAB — PROTIME-INR
INR: 0.99
Prothrombin Time: 13 seconds (ref 11.4–15.2)

## 2017-09-14 LAB — CBG MONITORING, ED: Glucose-Capillary: 129 mg/dL — ABNORMAL HIGH (ref 65–99)

## 2017-09-14 MED ORDER — ASPIRIN 81 MG PO CHEW
81.0000 mg | CHEWABLE_TABLET | Freq: Every day | ORAL | Status: DC
  Administered 2017-09-14 – 2017-09-17 (×4): 81 mg via ORAL
  Filled 2017-09-14 (×4): qty 1

## 2017-09-14 MED ORDER — CLOPIDOGREL BISULFATE 75 MG PO TABS
75.0000 mg | ORAL_TABLET | Freq: Every day | ORAL | Status: DC
  Administered 2017-09-14 – 2017-09-17 (×4): 75 mg via ORAL
  Filled 2017-09-14 (×4): qty 1

## 2017-09-14 MED ORDER — ACETAMINOPHEN 325 MG PO TABS
650.0000 mg | ORAL_TABLET | ORAL | Status: DC | PRN
  Administered 2017-09-17: 650 mg via ORAL
  Filled 2017-09-14: qty 2

## 2017-09-14 MED ORDER — ACETAMINOPHEN 160 MG/5ML PO SOLN: 650.0000 mg | ORAL | Status: DC | PRN

## 2017-09-14 MED ORDER — ATORVASTATIN CALCIUM 40 MG PO TABS
40.0000 mg | ORAL_TABLET | Freq: Every day | ORAL | Status: DC
  Administered 2017-09-14 – 2017-09-16 (×3): 40 mg via ORAL
  Filled 2017-09-14 (×3): qty 1

## 2017-09-14 MED ORDER — STROKE: EARLY STAGES OF RECOVERY BOOK
Freq: Once | Status: DC
  Filled 2017-09-14: qty 1

## 2017-09-14 MED ORDER — LACTATED RINGERS IV BOLUS
1000.0000 mL | Freq: Once | INTRAVENOUS | Status: AC
  Administered 2017-09-14: 1000 mL via INTRAVENOUS

## 2017-09-14 MED ORDER — INSULIN ASPART 100 UNIT/ML ~~LOC~~ SOLN: 0.0000 [IU] | Freq: Three times a day (TID) | SUBCUTANEOUS | Status: DC

## 2017-09-14 MED ORDER — SODIUM CHLORIDE 0.9 % IV SOLN
INTRAVENOUS | Status: AC
  Administered 2017-09-14: 15:00:00 via INTRAVENOUS

## 2017-09-14 MED ORDER — ACETAMINOPHEN 650 MG RE SUPP: 650.0000 mg | RECTAL | Status: DC | PRN

## 2017-09-14 NOTE — Consult Note (Addendum)
Neurology Consultation  Reason for Consult:  Acute code stroke Referring Physician: Dr Kathrynn Humble  CC: RSW and slurred speech  History is obtained from: chart, EMS, slurred speech  HPI: Child Jeremiah Vance is a 47 y.o. male past medical history of systolic CHF, chronic kidney disease, hypertension and a recent ischemic stroke involving the posterior limb of internal capsule on the left side with right-sided deficits, presenting to the emergency room for worsening right-sided weakness and slurred speech. He had a nearly exact similar presentation a week ago when he was found to be stuttering and weak on the right side by his coworkers.  He had a low NIH stroke scale and TPA was not given at that time. MRI revealed the stroke.  CT angios head and neck, dose of optimal in quality due to timing, did not show any significant stenosis or occlusion in the head and neck.  Echo showed LVEF 60 to 65% with LVH. He was discharged 5 days ago with aspirin and Plavix along with statin. He was in his usual state of health until this morning around 8:15 AM when he noticed complete flaccidity of the right arm. He was brought in as an acute code stroke.    LKW: 0815 09/14/2017 tpa given?: no, stroke 1 week ago. Premorbid modified Rankin scale (mRS): 2  ROS:  ROS was performed and is negative except as noted in the HPI.    Past Medical History:  Diagnosis Date  . CKD (chronic kidney disease) stage 3, GFR 30-59 ml/min (HCC)   . History of cardiomyopathy    LVEF 40 to 45% in April 2019 - subsequently normalized  . Hypertension   . Ischemic stroke ([place])    Small left internal capsule infarct due to lacunar disease  . Morbid obesity ([place])   . Type 2 diabetes mellitus (HCC)     Family History  Problem Relation Age of Onset  . Diabetes Father   . Heart disease Father   . Diabetes Sister   . Diabetes Mother   . Stroke Mother   . Diabetes Maternal Grandmother   . Heart disease Maternal Grandmother   .  Stroke Paternal Grandfather   . Heart disease Paternal Grandfather    Social History:   reports that he has never smoked. He has never used smokeless tobacco. He reports that he does not drink alcohol. His drug history is not on file.  Medications No current facility-administered medications for this encounter.   Current Outpatient Medications:  .  acetaminophen (TYLENOL) 500 MG tablet, Take 500 mg by mouth every 6 (six) hours as needed for mild pain or moderate pain., Disp: , Rfl:  .  aspirin 81 MG chewable tablet, Chew 1 tablet (81 mg total) by mouth daily., Disp: 30 tablet, Rfl: 0 .  atorvastatin (LIPITOR) 40 MG tablet, Take 1 tablet (40 mg total) by mouth daily at 6 PM., Disp: 30 tablet, Rfl: 0 .  blood glucose meter kit and supplies KIT, Dispense based on patient and insurance preference. Use up to four times daily as directed. (FOR ICD-9 250.00, 250.01)., Disp: 1 each, Rfl: 0 .  carvedilol (COREG) 6.25 MG tablet, Take 1 tablet (6.25 mg total) by mouth 2 (two) times daily., Disp: 60 tablet, Rfl: 3 .  clopidogrel (PLAVIX) 75 MG tablet, Take 1 tablet (75 mg total) by mouth daily., Disp: 30 tablet, Rfl: 0 .  clotrimazole (LOTRIMIN) 1 % cream, Apply topically 2 (two) times daily. Apply to suprapubic folds; keep area clean and  dry, Disp: 30 g, Rfl: 0 .  ibuprofen (ADVIL,MOTRIN) 200 MG tablet, Take 1 tablet (200 mg total) by mouth every 8 (eight) hours as needed for mild pain or moderate pain. (Patient taking differently: Take 400-600 mg by mouth every 8 (eight) hours as needed for mild pain (for knee pain). ), Disp: , Rfl:  .  lisinopril (PRINIVIL,ZESTRIL) 20 MG tablet, Take 1 tablet (20 mg total) by mouth daily., Disp: 30 tablet, Rfl: 3 .  metFORMIN (GLUCOPHAGE) 500 MG tablet, Take 1 tablet (500 mg total) by mouth 2 (two) times daily with a meal., Disp: 60 tablet, Rfl: 2 .  spironolactone (ALDACTONE) 25 MG tablet, Take 1 tablet (25 mg total) by mouth daily., Disp: 30 tablet, Rfl: 3 .  torsemide  (DEMADEX) 20 MG tablet, Take 2 tablets (40 mg total) by mouth daily., Disp: 60 tablet, Rfl: 4  Exam: Current vital signs: There were no vitals taken for this visit. Vital signs in last 24 hours:   Systolic blood pressure 510 palpable  GENERAL: Awake, alert in NAD HEENT: - Normocephalic and atraumatic, dry mm, no LN++, no Thyromegally LUNGS - Clear to auscultation bilaterally with no wheezes CV - S1S2 RRR, no m/r/g, equal pulses bilaterally. ABDOMEN - Soft, nontender, nondistended with normoactive BS Ext: warm, well perfused, intact peripheral pulses, no edema  NEURO:  Mental Status: AA&Ox3.  His affect is abnormal with acting surprised when asked a question about what his problems were or to follow commands. Language: speech is stuttering.  Naming, repetition, fluency, and comprehension intact. Cranial Nerves: PERRL. EOMI disconjugate eyes left exotropia, no nystagmus,, visual fields full, no facial asymmetry, facial sensation intact, hearing intact, tongue/uvula/soft palate midline, normal sternocleidomastoid and trapezius muscle strength. No evidence of tongue atrophy or fibrillations Motor: Right upper extremity without a drift but he keeps dropping it down and raises it back to full strength- somewhat effort dependent.  Right lower extremity exam is limited by excruciating pain in his knee he was able to lift his leg antigravity but it falls down to the bed, hence score of of 2 for that on the stroke scale.  Eft upper and lower extremity full-strength Tone: is normal and bulk is normal Sensation- Intact to light touch bilaterally Coordination: FTN intact bilaterally Gait- deferred  NIHSS 1a Level of Conscious.: 0 1b LOC Questions: 0 1c LOC Commands: 0 2 Best Gaze: 0 3 Visual: 0 4 Facial Palsy: 0 5a Motor Arm - left: 0 5b Motor Arm - Right: 1 6a Motor Leg - Left: 0 6b Motor Leg - Right: 2 7 Limb Ataxia: 0 8 Sensory: 0 9 Best Language: 0 10 Dysarthria: 1 11 Extinct. and  Inatten.: 0 TOTAL: 4  Labs I have reviewed labs in epic and the results pertinent to this consultation are:  CBC    Component Value Date/Time   WBC 8.5 09/08/2017 0406   RBC 4.45 09/08/2017 0406   HGB 12.7 (L) 09/08/2017 0406   HCT 39.6 09/08/2017 0406   PLT 247 09/08/2017 0406   MCV 89.0 09/08/2017 0406   MCH 28.5 09/08/2017 0406   MCHC 32.1 09/08/2017 0406   RDW 13.3 09/08/2017 0406   LYMPHSABS 1.9 09/06/2017 1547   MONOABS 0.8 09/06/2017 1547   EOSABS 0.2 09/06/2017 1547   BASOSABS 0.1 09/06/2017 1547    CMP     Component Value Date/Time   NA 139 09/08/2017 0406   K 4.0 09/08/2017 0406   CL 104 09/08/2017 0406   CO2 28 09/08/2017 0406  GLUCOSE 129 (H) 09/08/2017 0406   BUN 26 (H) 09/08/2017 0406   CREATININE 1.05 09/08/2017 0406   CALCIUM 8.5 (L) 09/08/2017 0406   PROT 7.3 09/07/2017 0530   ALBUMIN 3.0 (L) 09/07/2017 0530   AST 27 09/07/2017 0530   ALT 27 09/07/2017 0530   ALKPHOS 145 (H) 09/07/2017 0530   BILITOT 1.7 (H) 09/07/2017 0530   GFRNONAA >60 09/08/2017 0406   GFRAA >60 09/08/2017 0406    Lipid Panel     Component Value Date/Time   CHOL 189 09/07/2017 0530   TRIG 81 09/07/2017 0530   HDL 45 09/07/2017 0530   CHOLHDL 4.2 09/07/2017 0530   VLDL 16 09/07/2017 0530   LDLCALC 128 (H) 09/07/2017 0530     Imaging I have reviewed the images obtained: CT-scan of the brain-no acute changes. MRI from prior admission shows a punctate infarct in the posterior limb of the internal capsule on the left. Possibly other chronic lacunar infarcts as well.  Assessment:  47 year old man with above-mentioned past medical history and recent posterior limb of the internal capsule stroke on the left with residual left-sided deficits presented with stuttering speech and right-sided weakness. His exam has some inconsistencies with effort dependent weakness and pain in the right leg precluding a normal exam. In either case, even if this is a true stroke, he is not a  candidate for IV TPA.  He has no cortical signs to reflect large vessel occlusion, hence not a candidate for endovascular. My differentials include: - Recrudescence of his recent stroke symptoms - Possible new stroke-less likely with stuttering speech and inconsistent weakness that is effort dependent  Recommendations: No need to pursue head and neck CTA as it was just done a few days ago. Obtain a stat MRI of the brain to look for any new areas of stroke Check labs to include CBC, CMP as well as a urinalysis and chest x-ray. If all the above work-up is unremarkable, he needs to continue his PT OT and can be discharged home. He needs to be on dual antiplatelets and high-dose statin. I will update the note after MRI results become available. I discussed the plan in person with the ED provider. -- Amie Portland, MD [facility] Pager: [phone] If 7pm to 7am, please call on call as listed on AMION.   ADDENDUM Labs show AKI. Patient being admitted for management of AKI and optimization of meds.  Impression: New stroke v Stroke Recrudescence. Still not a candidate for intervention (due to reasons above).  Recs as above. Stroke team will follow after MRI.  Please call with questions  -- Amie Portland, MD [facility] Pager: [phone] If 7pm to 7am, please call on call as listed on AMION.   Addendum after MRI Restricted diffusion in the right thalamus, which is probably the evolution of the prior stroke that appeared on the posterior limb of internal capsule plus an additional area of restricted diffusion in the right basal ganglia-my suspicion is this is the same stroke process and the last MRI was done early in the course and did not catch these strokes. In either case, he needs to be admitted.  He will be followed by the stroke team in the morning. Continue dual antiplatelet Sandostatin. No need to repeat 2D echo. No need to repeat CT angios No need to  repeat A1c and LDL Stroke team to review and assess the need for possible TEE.

## 2017-09-14 NOTE — H&P (Signed)
[facility] Admission History and Physical Service Pager: [phone]  Patient name: Jeremiah Vance Medical record number: [id number] Date of birth: 10/12/1970 Age: 47 y.o. Gender: male  Primary Care Provider: Patient, No Pcp Per Consultants: Neurology Code Status: Full  Chief Complaint: Right hand and face numbness, slurred speech.  Assessment and Plan: Jeremiah Vance is a 47 y.o. male past medical history significant for type 2 diabetes, HFrEF, hypertension and recent left internal capsule stroke who presented with left hand and face numbness as well as slurred speech concerning for acute stroke.  #Right hand/face numbness, slurred speech, acute, resolving Patient presented with right hand and face numbness as well as slurred speech noted this morning while at work.  Patient came to the ED where head CT was negative for any acute findings.  Neurology was consulted and he seemed to think that patient's symptoms are manifestation of progression from prior stroke.  Patient was recently admitted for left internal capsule stroke which was seen on MRI.  Patient was discharged on 6/10 on dual antiplatelet therapy.  Patient is not a candidate for TPA.  On exam patient still had decreased sensation in her right hand and face however source patient had essentially resolved with some stuttering noted.  Neurology recommended MRI brain for further assessment of patient's acute complaints.  There could also be an element of anxiety that could be contributing to this patient's symptoms after recent diagnosis for stroke.  Given that most of the stroke work-up has been done in the past week or so we will only order MRI and have stroke team follow-up in the morning as discussed with neurology team. --Admit to FMTS, admitting physician Dr. Nori Riis --Admit to telemetry --Follow-up on MRI head --Follow-up on stroke team consult, appreciate recs --Continue atorvastatin 40 mg  daily --Continue aspirin 81 mg daily --Neurochecks every 2 --Swallow screen --PT/OT --Follow-up on a.m. CBC and BMP  #AKI On admission patient's creatinine is 2.6 significantly up from discharge on 610 when creatinine was 1.05.  Baseline creatinine around 1-1.2.  Significantly worsening kidney function likely to be prerenal in nature.  However patient is also on torsemide he takes daily which could be at adding to the insult.  Patient is also on lisinopril and metformin which could be contributing to patient acute kidney injury.  no signs of hypertension on admission.  AKI likely to be multifactorial secondary to several nephrotoxic agent as well as disease progression from diabetes in the setting of poor p.o. intake. --Hold torsemide, lisinopril, metformin --Normal saline 100 cc/h for 5 hours given history of reduced EF --Follow-up on a.m. 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  This was an improvement from an echo done on 4/12 which had a reduced EF of 40 to 45% with diffuse hypokinesis.  Patient is on torsemide 20 mg daily.  Patient was recently seen by cardiologist on 6/12 who recommended to continue medical therapy. Today on exam patient does not have any signs of volume overload such as crackles on pulmonary exam or lower extremity edema.  Also has withheld in the setting of AKI.  Patient is currently receiving NS 100 cc/h.  Will closely monitor volume status. --Hold torsemide, spironolactone, carvedilol  --Daily weights --Strict I's and O's  #Type 2 diabetes Patient  last A1c was 6.9.  Patient is currently on metformin and has had worsening in glycemic control when compared to  A1c of 6.4 back in April 2018.  Patient has had worsening kidney function suspicious  for diabetic nephropathy.  On admission creatinine is 2.6 significantly worse from discharge on 6/10 which was 1.05. --Hold metformin in setting of AKI --Start patient on sensitive sliding scale adjust as needed --CBG AC and nightly  #Hypertension On admission patient's blood pressure was 144/75.  Patient is currently on lisinopril, carvedilol as well as spironolactone.  We will hold in the setting of acute stroke as well as AKI. --We will resume home regimen when appropriate  FEN/GI:Heart healthy carb modified diet after swallow screen Prophylaxis: SCDs patient is currently on dual platelet therapy  Disposition: Home pending work-up  History of Present Illness:  Jeremiah Vance is a 47 y.o. male past medical history significant for type 2 diabetes, CHF, hypertension and recent left internal capsule stroke who presents today complaining of right hand and face numbness numbness and slurred speech.  Patient reports that he was at his baseline and was at work and around 815 he noticed he was having some numbness in his right hand and face as well as a change in his speech.  Patient called EMS.  Patient reports that he has been taking his Plavix and aspirin as instructed on discharge.  He was recently seen by his cardiologist on 6/12 with recommendation to continue medical management of his heart failure.  Patient denies any other neurologic deficit such as right leg weakness, dizziness, chest pain, shortness of breath, abdominal pain, nausea, vomiting. On arrival to the ED, code stroke was initiated.  Head CT was negative for any acute finding.  Neurology evaluation was concerning for either progression of prior stroke or new acute CVA.  Recommendation was made for MRI but no other imaging studies such as CTA/carotid doppler or echo.  Patient was also noted to have an elevated creatinine 2.62 significantly worse than when he was discharge 6 days ago.  Family medicine teaching service was consulted for  further work-up of neuro symptoms as well as AKI.  Patient was stable with majority of symptoms resolving.  Review Of Systems: Per HPI with the following additions:  Review of Systems  Constitutional: Negative.   HENT: Negative.   Eyes: Negative.   Respiratory: Negative.   Cardiovascular: Negative.   Gastrointestinal: Negative.   Genitourinary: Negative.   Musculoskeletal: Negative.   Skin: Negative.   Neurological: Positive for tingling, speech change and weakness.  Endo/Heme/Allergies: Negative.   Psychiatric/Behavioral: Negative.     Patient Active Problem List   Diagnosis Date Noted  . Stroke ([place]) 09/14/2017  . Acute right-sided weakness   . AKI (acute kidney injury) ([place])   . Congestive heart failure ([place])   . Hyperlipidemia   . Stroke (cerebrum) ([place]) 09/07/2017  . Gout   . Acute ischemic stroke ([place]) 09/06/2017  . Anasarca   . Elevated LFTs   . Acute systolic HF (heart failure) ([place])   . Obesity, Class III, BMI 40-49.9 (morbid obesity) ([place])   . Hypoalbuminemia 07/11/2017  . Tinea corporis 07/11/2017  . Volume overload 07/10/2017  . Hyperbilirubinemia 07/10/2017  . Anemia 07/10/2017  . Diabetes mellitus with nephropathy ([place]) 07/10/2017  . Hypertension 07/10/2017    Past Medical History: Past Medical History:  Diagnosis Date  . CKD (chronic kidney disease) stage 3, GFR 30-59 ml/min (HCC)   . History of cardiomyopathy  LVEF 40 to 45% in April 2019 - subsequently normalized  . Hypertension   . Ischemic stroke ([place])    Small left internal capsule infarct due to lacunar disease  . Morbid obesity ([place])   . Type 2 diabetes mellitus ([place])     Past Surgical History: Past Surgical History:  Procedure Laterality Date  . HERNIA REPAIR      Social History: Social History   Tobacco Use  . Smoking status: Never Smoker  . Smokeless tobacco: Never Used  Substance Use Topics  . Alcohol use: Never    Frequency: Never  . Drug use: Not on file    Additional social history:  Please also refer to relevant sections of EMR.  Family History: Family History  Problem Relation Age of Onset  . Diabetes Father   . Heart disease Father   . Diabetes Sister   . Diabetes Mother   . Stroke Mother   . Diabetes Maternal Grandmother   . Heart disease Maternal Grandmother   . Stroke Paternal Grandfather   . Heart disease Paternal Grandfather    (If not completed, MUST add something in)  Allergies and Medications: No Known Allergies No current facility-administered medications on file prior to encounter.    Current Outpatient Medications on File Prior to Encounter  Medication Sig Dispense Refill  . acetaminophen (TYLENOL) 500 MG tablet Take 500 mg by mouth every 6 (six) hours as needed for mild pain or moderate pain.    [name] Kitchen aspirin 81 MG chewable tablet Chew 1 tablet (81 mg total) by mouth daily. 30 tablet 0  . atorvastatin (LIPITOR) 40 MG tablet Take 1 tablet (40 mg total) by mouth daily at 6 PM. 30 tablet 0  . calcium carbonate (TUMS EX) 750 MG chewable tablet Chew 2 tablets by mouth as needed for heartburn.    . carvedilol (COREG) 6.25 MG tablet Take 1 tablet (6.25 mg total) by mouth 2 (two) times daily. 60 tablet 3  . clopidogrel (PLAVIX) 75 MG tablet Take 1 tablet (75 mg total) by mouth daily. 30 tablet 0  . ibuprofen (ADVIL,MOTRIN) 200 MG tablet Take 1 tablet (200 mg total) by mouth every 8 (eight) hours as needed for mild pain or moderate pain. (Patient taking differently: Take 400-600 mg by mouth every 8 (eight) hours as needed for mild pain (for knee pain). )    . lisinopril (PRINIVIL,ZESTRIL) 20 MG tablet Take 1 tablet (20 mg total) by mouth daily. 30 tablet 3  . metFORMIN (GLUCOPHAGE) 500 MG tablet Take 1 tablet (500 mg total) by mouth 2 (two) times daily with a meal. 60 tablet 2  . spironolactone (ALDACTONE) 25 MG tablet Take 1 tablet (25 mg total) by mouth daily. 30 tablet 3  . torsemide (DEMADEX) 20 MG tablet Take 2 tablets (40  mg total) by mouth daily. 60 tablet 4  . blood glucose meter kit and supplies KIT Dispense based on patient and insurance preference. Use up to four times daily as directed. (FOR ICD-9 250.00, 250.01). 1 each 0  . clotrimazole (LOTRIMIN) 1 % cream Apply topically 2 (two) times daily. Apply to suprapubic folds; keep area clean and dry (Patient not taking: Reported on 09/14/2017) 30 g 0    Objective: BP (!) 142/74   Pulse (!) 58   Temp 97.8 F (36.6 C)   Resp (!) 21   SpO2 100%    Physical Exam  General: NAD, pleasant, able to participate in exam HEENT: PEERLA, atraumatic, no significant speech change  Cardiac: RRR, normal heart sounds, no murmurs. 2+ radial and PT pulses bilaterally Respiratory: CTAB, normal effort, No wheezes, rales or rhonchi Abdomen: soft, nontender, nondistended, no hepatic or splenomegaly, +BS Extremities: no edema or cyanosis. WWP. Skin: warm and dry, no rashes noted Neuro: alert and oriented x4, decrease sensation right face, decrease strength 4/5 right arm, left arm 5/5 strength and sensation. Strength intact bilaterally 5/5 I in lower extremities. Finger to nose and rapid alternating movement normal. Reflexes intact. Psych: Normal affect and mood    Labs and Imaging: CBC BMET  Recent Labs  Lab 09/14/17 0854 09/14/17 0900  WBC 8.1  --   HGB 13.6 13.9  HCT 42.7 41.0  PLT 285  --    Recent Labs  Lab 09/14/17 0854 09/14/17 0900  NA 137 136  K 4.1 4.0  CL 99* 99*  CO2 24  --   BUN 63* 56*  CREATININE 2.49* 2.60*  GLUCOSE 143* 137*  CALCIUM 9.0  --      iTrop: 0.02  Marjie Skiff, MD 09/14/2017, 3:40 PM PGY-2, [facility] pager: [phone], text pages welcome

## 2017-09-14 NOTE — ED Notes (Signed)
Pt transporting to MRI.  

## 2017-09-14 NOTE — ED Triage Notes (Signed)
Pt arrives to ER by [facility] with complaint of acute onset dizziness and light headedness with right arm weakness and slurred speech at 8:15 this morning. Pt had CVA with similar symptoms 09/06/17. Pt on arrival is stuttering, speech is clear. Right arm weakness noted. Pt is a/o x4.

## 2017-09-14 NOTE — ED Notes (Signed)
Pt returned from MRI °

## 2017-09-14 NOTE — ED Provider Notes (Addendum)
[facility] EMERGENCY DEPARTMENT Provider Note   CSN: [id number] Arrival date & time: 09/14/17  6834   An emergency department physician performed an initial assessment on this suspected stroke patient at [place].  History   Chief Complaint Chief Complaint  Patient presents with  . Code Stroke    HPI Jeremiah Vance is a 47 y.o. male.  HPI 47 year old with history of CKD, CHF, diabetes and a recent admission for stroke comes in with chief complaint of right-sided numbness, weakness and slurred speech.  Patient states that about 10 minutes prior to him calling EMS he had sudden onset of right-sided weakness and numbness in the upper extremity and his face.  Patient also started stuttering -all of which were the exact presentation when he presented few days ago and was found to have new insular stroke.  Patient arrives to the ER as code stroke.  Past Medical History:  Diagnosis Date  . CKD (chronic kidney disease) stage 3, GFR 30-59 ml/min (HCC)   . History of cardiomyopathy    LVEF 40 to 45% in April 2019 - subsequently normalized  . Hypertension   . Ischemic stroke ([place])    Small left internal capsule infarct due to lacunar disease  . Morbid obesity ([place])   . Type 2 diabetes mellitus [facility])     Patient Active Problem List   Diagnosis Date Noted  . Congestive heart failure ([place])   . Hyperlipidemia   . Stroke (cerebrum) ([place]) 09/07/2017  . Gout   . Acute ischemic stroke ([place]) 09/06/2017  . Anasarca   . Elevated LFTs   . Acute systolic HF (heart failure) ([place])   . Obesity, Class III, BMI 40-49.9 (morbid obesity) ([place])   . Hypoalbuminemia 07/11/2017  . Tinea corporis 07/11/2017  . Volume overload 07/10/2017  . Hyperbilirubinemia 07/10/2017  . Anemia 07/10/2017  . Diabetes mellitus with nephropathy ([place]) 07/10/2017  . Hypertension 07/10/2017    Past Surgical History:  Procedure Laterality Date  . HERNIA REPAIR          Home Medications     Prior to Admission medications   Medication Sig Start Date End Date Taking? Authorizing Provider  acetaminophen (TYLENOL) 500 MG tablet Take 500 mg by mouth every 6 (six) hours as needed for mild pain or moderate pain.   Yes [provider]  aspirin 81 MG chewable tablet Chew 1 tablet (81 mg total) by mouth daily. 09/09/17  Yes Guadalupe Dawn, MD  atorvastatin (LIPITOR) 40 MG tablet Take 1 tablet (40 mg total) by mouth daily at 6 PM. 09/08/17  Yes Guadalupe Dawn, MD  calcium carbonate (TUMS EX) 750 MG chewable tablet Chew 2 tablets by mouth as needed for heartburn.   Yes [provider]  carvedilol (COREG) 6.25 MG tablet Take 1 tablet (6.25 mg total) by mouth 2 (two) times daily. 07/18/17 07/18/18 Yes Barton Dubois, MD  clopidogrel (PLAVIX) 75 MG tablet Take 1 tablet (75 mg total) by mouth daily. 09/08/17  Yes Guadalupe Dawn, MD  ibuprofen (ADVIL,MOTRIN) 200 MG tablet Take 1 tablet (200 mg total) by mouth every 8 (eight) hours as needed for mild pain or moderate pain. Patient taking differently: Take 400-600 mg by mouth every 8 (eight) hours as needed for mild pain (for knee pain).  07/18/17  Yes Barton Dubois, MD  lisinopril (PRINIVIL,ZESTRIL) 20 MG tablet Take 1 tablet (20 mg total) by mouth daily. 07/19/17  Yes Barton Dubois, MD  metFORMIN (GLUCOPHAGE) 500 MG tablet Take 1 tablet (  500 mg total) by mouth 2 (two) times daily with a meal. 07/18/17 07/18/18 Yes Barton Dubois, MD  spironolactone (ALDACTONE) 25 MG tablet Take 1 tablet (25 mg total) by mouth daily. 07/19/17  Yes Barton Dubois, MD  torsemide (DEMADEX) 20 MG tablet Take 2 tablets (40 mg total) by mouth daily. 09/10/17  Yes Satira Sark, MD  blood glucose meter kit and supplies KIT Dispense based on patient and insurance preference. Use up to four times daily as directed. (FOR ICD-9 250.00, 250.01). 07/18/17   Barton Dubois, MD  clotrimazole (LOTRIMIN) 1 % cream Apply topically 2 (two) times daily. Apply to  suprapubic folds; keep area clean and dry Patient not taking: Reported on 09/14/2017 07/18/17   Barton Dubois, MD    Family History Family History  Problem Relation Age of Onset  . Diabetes Father   . Heart disease Father   . Diabetes Sister   . Diabetes Mother   . Stroke Mother   . Diabetes Maternal Grandmother   . Heart disease Maternal Grandmother   . Stroke Paternal Grandfather   . Heart disease Paternal Grandfather     Social History Social History   Tobacco Use  . Smoking status: Never Smoker  . Smokeless tobacco: Never Used  Substance Use Topics  . Alcohol use: Never    Frequency: Never  . Drug use: Not on file     Allergies   Patient has no known allergies.   Review of Systems Review of Systems  Constitutional: Positive for activity change.  Respiratory: Negative for chest tightness and shortness of breath.   Cardiovascular: Negative for chest pain.  Gastrointestinal: Negative for nausea and vomiting.  Allergic/Immunologic: Negative for immunocompromised state.  Neurological: Positive for speech difficulty and weakness.  Hematological: Does not bruise/bleed easily.  All other systems reviewed and are negative.    Physical Exam Updated Vital Signs BP 115/64 (BP Location: Left Arm)   Pulse 61   Temp 97.8 F (36.6 C) (Temporal)   Resp 17   SpO2 98%   Physical Exam  Constitutional: He is oriented to person, place, and time. He appears well-developed.  HENT:  Head: Atraumatic.  Eyes: Pupils are equal, round, and reactive to light. EOM are normal.  Neck: Neck supple.  Cardiovascular: Normal rate.  Pulmonary/Chest: Effort normal.  Neurological: He is alert and oriented to person, place, and time.  Patient is stuttering, otherwise no clear evidence of dysarthria. Subjective numbness to the right side of the face and right upper extremity. Extraocular muscles are intact. 4+ out of 5 upper and lower extremity strength  Skin: Skin is warm.  Nursing  note and vitals reviewed.    ED Treatments / Results  Labs (all labs ordered are listed, but only abnormal results are displayed) Labs Reviewed  COMPREHENSIVE METABOLIC PANEL - Abnormal; Notable for the following components:      Result Value   Chloride 99 (*)    Glucose, Bld 143 (*)    BUN 63 (*)    Creatinine, Ser 2.49 (*)    Albumin 3.4 (*)    GFR calc non Af Amer 29 (*)    GFR calc Af Amer 34 (*)    All other components within normal limits  I-STAT CHEM 8, ED - Abnormal; Notable for the following components:   Chloride 99 (*)    BUN 56 (*)    Creatinine, Ser 2.60 (*)    Glucose, Bld 137 (*)    Calcium, Ion 0.97 (*)  All other components within normal limits  PROTIME-INR  APTT  CBC  DIFFERENTIAL  I-STAT TROPONIN, ED  CBG MONITORING, ED    EKG EKG Interpretation  Date/Time:  'Sunday September 14 2017 09:06:48 EDT Ventricular Rate:  61 PR Interval:    QRS Duration: 100 QT Interval:  449 QTC Calculation: 453 R Axis:   64 Text Interpretation:  Sinus rhythm Atrial premature complex Probable left atrial enlargement No acute changes hyperacute T wave in v3, v4 is new Confirmed by Makailah Slavick ([id number]) on 09/14/2017 11:15:16 AM   Radiology Ct Head Code Stroke Wo Contrast  Result Date: 09/14/2017 CLINICAL DATA:  Code stroke. Focal neuro deficit. Right-sided weakness and slurred speech EXAM: CT HEAD WITHOUT CONTRAST TECHNIQUE: Contiguous axial images were obtained from the base of the skull through the vertex without intravenous contrast. COMPARISON:  CT head 09/08/2017 FINDINGS: Brain: No evidence of acute infarction, hemorrhage, hydrocephalus, extra-axial collection or mass lesion/mass effect. Vascular: Mild atherosclerotic disease. Negative for hyperdense vessel Skull: Negative Sinuses/Orbits: Negative Other: None ASPECTS ([place] Stroke Program Early CT Score) - Ganglionic level infarction (caudate, lentiform nuclei, internal capsule, insula, M1-M3 cortex): 7 -  Supraganglionic infarction (M4-M6 cortex): 3 Total score (0-10 with 10 being normal): 10 IMPRESSION: 1. No acute intracranial abnormality 2. ASPECTS is 10 3. These results were called by telephone at the time of interpretation on 09/14/2017 at 9:06 am to Dr. Arora , who verbally acknowledged these results. Electronically Signed   By: Charles  Clark M.D.   On: 09/14/2017 09:07    Procedures Procedures (including critical care time)  Medications Ordered in ED Medications  lactated ringers bolus 1,000 mL (has no administration in time range)     Initial Impression / Assessment and Plan / ED Course  I have reviewed the triage vital signs and the nursing notes.  Pertinent labs & imaging results that were available during my care of the patient were reviewed by me and considered in my medical decision making (see chart for details).     46'  year old with history of stroke CHF, diabetes, hypertension comes in with chief complaint of right-sided weakness and speech stuttering.  Differential diagnosis includes ischemic stroke, electrolyte abnormalities, dehydration, anxiety.  Patient arrived as code stroke -plan from the stroke team is to get emergent work-up and had MRI of the brain to rule out acute stroke.  If there is no stroke patient can be discharged.  In the interim, patient's labs show that he has new AKI.  Patient has history of CHF and he is on a couple of medications which are nephrotoxins therefore we will admit him for optimization.  Review of system is negative for any and fevers, chills, new cough, new abdominal pain, UTI-like symptoms.  Final Clinical Impressions(s) / ED Diagnoses   Final diagnoses:  AKI (acute kidney injury) ([place])  Acute right-sided weakness    ED Discharge Orders    None        Varney Biles, MD 09/14/17 1115

## 2017-09-14 NOTE — Evaluation (Signed)
Speech Language Pathology Evaluation Patient Details Name: Jeremiah Vance MRN: [id number] DOB: Jan 16, 1971 Today's Date: 09/14/2017 Time: 0786-7544 SLP Time Calculation (min) (ACUTE ONLY): 15 min  Problem List:  Patient Active Problem List   Diagnosis Date Noted  . Stroke (HCC) 09/14/2017  . Acute right-sided weakness   . AKI (acute kidney injury) (HCC)   . Congestive heart failure (HCC)   . Hyperlipidemia   . Stroke (cerebrum) (HCC) 09/07/2017  . Gout   . Acute ischemic stroke (HCC) 09/06/2017  . Anasarca   . Elevated LFTs   . Acute systolic HF (heart failure) (HCC)   . Obesity, Class III, BMI 40-49.9 (morbid obesity) (HCC)   . Hypoalbuminemia 07/11/2017  . Tinea corporis 07/11/2017  . Volume overload 07/10/2017  . Hyperbilirubinemia 07/10/2017  . Anemia 07/10/2017  . Diabetes mellitus with nephropathy (HCC) 07/10/2017  . Hypertension 07/10/2017   Past Medical History:  Past Medical History:  Diagnosis Date  . CKD (chronic kidney disease) stage 3, GFR 30-59 ml/min (HCC)   . History of cardiomyopathy    LVEF 40 to 45% in April 2019 - subsequently normalized  . Hypertension   . Ischemic stroke (HCC)    Small left internal capsule infarct due to lacunar disease  . Morbid obesity (HCC)   . Type 2 diabetes mellitus (HCC)    Past Surgical History:  Past Surgical History:  Procedure Laterality Date  . HERNIA REPAIR     HPI:  47 year old with history of CKD, CHF, diabetes and a recent admission for stroke comes in with chief complaint of right-sided numbness, weakness and slurred speech. Patient states that about 10 minutes prior to him calling EMS he had sudden onset of right-sided weakness and numbness in the upper extremity and his face.  Patient also started stuttering -all of which were the exact presentation when he presented few days ago and was found to have new insular stroke. MRI showed acute infarct in the left thalamus, posterior basal ganglia, and lateral  geniculate.   Assessment / Plan / Recommendation Clinical Impression  Patient presents with mild stuttering, which he reports he has noticed since his recent CVA on 6/8 but feels has worsened this admission. Repetitions of initial consonants, syllables noted intermittently during conversation and assessment tasks. Oral motor strength, ROM intact, though slight tremor is noted with lingual protrusion and jaw opening. This SLP does not appreciate dysarthria, and speech is 100% intelligible, however pt reports he feels his speech is "still a little slurred." Cognition and verbal expression appear intact. SLP will follow acutely for compensations for stuttering; pt would like ST follow-up at d/c (would recommend OP) if deficits do not resolve.      SLP Assessment  SLP Recommendation/Assessment: Patient needs continued Speech Lanaguage Pathology Services SLP Visit Diagnosis: Other (comment)(stuttering)    Follow Up Recommendations  Other (comment)(tbd)    Frequency and Duration min 1 x/week  1 week      SLP Evaluation Cognition  Overall Cognitive Status: Within Functional Limits for tasks assessed Arousal/Alertness: Awake/alert Orientation Level: Oriented X4 Attention: Selective Selective Attention: Appears intact Memory: Appears intact Awareness: Appears intact Problem Solving: Appears intact       Comprehension  Auditory Comprehension Overall Auditory Comprehension: Appears within functional limits for tasks assessed Visual Recognition/Discrimination Discrimination: Within Function Limits Reading Comprehension Reading Status: Not tested    Expression Expression Primary Mode of Expression: Verbal Verbal Expression Overall Verbal Expression: Appears within functional limits for tasks assessed Written Expression Dominant Hand: Right Written  Expression: Not tested   Oral / Motor  Oral Motor/Sensory Function Overall Oral Motor/Sensory Function: Within functional limits Motor  Speech Overall Motor Speech: Impaired Respiration: Within functional limits Phonation: Normal Resonance: Within functional limits Articulation: Within functional limitis(perhaps minimal; pt feels speech is slurred) Intelligibility: Intelligible Motor Planning: Witnin functional limits Motor Speech Errors: Inconsistent   GO                   Rondel Baton, MS, CCC-SLP Speech-Language Pathologist [phone]  Arlana Lindau 09/14/2017, 5:00 PM

## 2017-09-15 ENCOUNTER — Inpatient Hospital Stay (HOSPITAL_COMMUNITY): Payer: Self-pay

## 2017-09-15 DIAGNOSIS — E118 Type 2 diabetes mellitus with unspecified complications: Secondary | ICD-10-CM

## 2017-09-15 DIAGNOSIS — R531 Weakness: Secondary | ICD-10-CM

## 2017-09-15 DIAGNOSIS — I639 Cerebral infarction, unspecified: Principal | ICD-10-CM

## 2017-09-15 DIAGNOSIS — I10 Essential (primary) hypertension: Secondary | ICD-10-CM

## 2017-09-15 LAB — BASIC METABOLIC PANEL
Anion gap: 7 (ref 5–15)
BUN: 54 mg/dL — AB (ref 6–20)
CALCIUM: 8.3 mg/dL — AB (ref 8.9–10.3)
CO2: 28 mmol/L (ref 22–32)
Chloride: 103 mmol/L (ref 101–111)
Creatinine, Ser: 1.8 mg/dL — ABNORMAL HIGH (ref 0.61–1.24)
GFR calc Af Amer: 50 mL/min — ABNORMAL LOW (ref >60)
GFR calc non Af Amer: 43 mL/min — ABNORMAL LOW (ref >60)
Glucose, Bld: 103 mg/dL — ABNORMAL HIGH (ref 65–99)
Potassium: 4.4 mmol/L (ref 3.5–5.1)
SODIUM: 138 mmol/L (ref 135–145)

## 2017-09-15 LAB — GLUCOSE, CAPILLARY
Glucose-Capillary: 94 mg/dL (ref 65–99)
Glucose-Capillary: 116 mg/dL — ABNORMAL HIGH (ref 65–99)
Glucose-Capillary: 114 mg/dL — ABNORMAL HIGH (ref 65–99)
Glucose-Capillary: 118 mg/dL — ABNORMAL HIGH (ref 65–99)

## 2017-09-15 LAB — CBC
HCT: 39 % (ref 39.0–52.0)
Hemoglobin: 12.5 g/dL — ABNORMAL LOW (ref 13.0–17.0)
MCH: 28.5 pg (ref 26.0–34.0)
MCHC: 32.1 g/dL (ref 30.0–36.0)
MCV: 89 fL (ref 78.0–100.0)
PLATELETS: 279 10*3/uL (ref 150–400)
RBC: 4.38 MIL/uL (ref 4.22–5.81)
RDW: 13.3 % (ref 11.5–15.5)
WBC: 7.6 10*3/uL (ref 4.0–10.5)

## 2017-09-15 MED ORDER — GADOBENATE DIMEGLUMINE 529 MG/ML IV SOLN
10.0000 mL | Freq: Once | INTRAVENOUS | Status: AC
  Administered 2017-09-15: 10 mL via INTRAVENOUS

## 2017-09-15 MED ORDER — ENOXAPARIN SODIUM 40 MG/0.4ML ~~LOC~~ SOLN: 40.0000 mg | SUBCUTANEOUS | Status: DC

## 2017-09-15 NOTE — Progress Notes (Addendum)
Family Medicine Teaching Service Daily Progress Note Intern Pager: [phone]  Patient name: Jeremiah Vance Medical record number: [id number] Date of birth: Mar 14, 1971 Age: 47 y.o. Gender: male  Primary Care Provider: Patient, No Pcp Per Consultants: Neurology Code Status: full  Pt Overview and Major Events to Date:  6/16 admitted to fpts  Assessment and Plan: Jeremiah Vance is a 47 y.o. male past medical history significant for type 2 diabetes, HFrEF, hypertension and recent left internal capsule stroke who presented with left hand and face numbness as well as slurred speech concerning for acute stroke.  Right hand/face numbness, slurred speech, acute, resolving Admitted with very similar symptoms to his admission on 6/8. Resolving. Patient with recurrent stroke on asa, plavix and statin. From prevention perspective he is on optimal therapy. Could consider hypercoag workup. Neurology following, appreciate their recs. From previous notes they have mentioned that a TEE might be helpful. Will follow their recs. Also will follow up PT/OT recs. - continue cardiac monitoring - follow up PT/OT recs - follow up neurology recommendations - continue ASA, plavix, atorvastatin  AKI Creatinine up to 2.6. Cr 1.05 at discharge on 6/10. Patient with diuretic, ace and has been taking ibuprofen. Likely AKI is due to these agents. Improved to 1.8 this am on minimal fluids and holding these agents. Could consider getting labs to evaluate pre/post/intrinsic renal etiology but will hold off given improvment. Likely would benefit from voltaren gel for knee in place of systemic nsaid agent. 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 Much improved from previous admission. Saw outpatient cardiologist who recommended continuing his current  regimen. Given his now 2nd aki could consider holding lisinopril on discharge. Would opt to continue ace and simply hold NSAID though. - daily weights - strict I/O - hold torsemide, spiro, lisinopril, coreg  Type 2 diabetes Patient last A1c was 6.9. Glucose 116 at last check. Carb-modified heart healthy diet. sSSI. -Hold metformin in setting of AKI -sSSI -CBG AC and nightly  Hypertension AM blood pressure 158/76. Will allow anything less than 200SBP for permissive htn. - restart torsemide, spiro, lisinopril, coreg  FEN/GI: Lovenox when ok with cardiology PPx: heart healthy carb-modified  Disposition: pending clinical course  Subjective:  Doing well this morning. Not yet back to baseline. Unhappy with his breakfast.  Objective: Temp:  [97.7 F (36.5 C)-98.4 F (36.9 C)] 97.7 F (36.5 C) (06/17 1214) Pulse Rate:  [51-63] 52 (06/17 1214) Resp:  [17-24] 18 (06/17 1214) BP: (120-169)/(65-84) 158/76 (06/17 1214) SpO2:  [98 %-100 %] 100 % (06/17 1214) Weight:  [262 lb 2 oz (118.9 kg)] 262 lb 2 oz (118.9 kg) (06/16 1555) Physical Exam: General: No Acute Distress, pleasant, comfortable caucasian male resting in bed Cardiac: RRR, normal heart sounds, no murmurs. 2+ radial and PT pulses bilaterally Respiratory: CTAB, normal effort, No wheezes, rales or rhonchi Abdomen: soft, nontender, nondistended, no hepatic or splenomegaly, +BS Extremities: no edema or cyanosis. WWP. Skin: warm and dry, no rashes noted Neuro: alert and oriented x4, decrease sensation right face, decrease strength 4/5 right arm, left arm 5/5 strength and sensation. Strength intact bilaterally 5/5 I in lower extremities. Finger to nose and rapid alternating movement normal. Reflexes intact. Psych: Normal affect and mood  Laboratory: Recent Labs  Lab 09/14/17 0854 09/14/17 0900 09/15/17 0618  WBC 8.1  --  7.6  HGB 13.6 13.9 12.5*  HCT 42.7 41.0 39.0  PLT 285  --  279   Recent Labs  Lab 09/14/17 0854  09/14/17 0900 09/15/17 0618  NA 137 136 138  K 4.1 4.0 4.4  CL 99* 99* 103  CO2 24  --  28  BUN 63* 56* 54*  CREATININE 2.49* 2.60* 1.80*  CALCIUM 9.0  --  8.3*  PROT 8.0  --   --   BILITOT 1.2  --   --   ALKPHOS 125  --   --   ALT 23  --   --   AST 27  --   --   GLUCOSE 143* 137* 103*    Imaging/Diagnostic Tests: CLINICAL DATA:  Focal neuro deficit less than 6 hours, suspect stroke  EXAM: MRI HEAD WITHOUT CONTRAST  TECHNIQUE: Multiplanar, multiecho pulse sequences of the brain and surrounding structures were obtained without intravenous contrast.  COMPARISON:  CT head 09/14/2017  FINDINGS: Brain: Acute infarct left lateral thalamus and left posterior putamen. Small acute infarct left lateral geniculate.  Chronic microvascular ischemic changes in the white matter and basal ganglia bilaterally. Chronic microhemorrhage in the thalamus bilaterally and left lateral basal ganglia. Chronic hemorrhage in the left parietal periventricular white matter.  Ventricle size normal. Cerebral volume normal for age. No midline shift.  Vascular: Normal arterial flow void  Skull and upper cervical spine: Negative  Sinuses/Orbits: Negative  Other: None  IMPRESSION: Acute infarct in the left thalamus, posterior basal ganglia, and lateral geniculate  Moderate chronic microvascular ischemia. Chronic areas of chronic microhemorrhage.   Electronically Signed   By: Marlan Palau M.D.   On: 09/14/2017 12:36  Myrene Buddy, MD 09/15/2017, 12:20 PM PGY-1, [facility] Family Medicine FPTS Intern pager: [phone], text pages welcome

## 2017-09-15 NOTE — Evaluation (Signed)
Physical Therapy Evaluation Patient Details Name: Jeremiah Vance MRN: [id number] DOB: 22-Nov-1970 Today's Date: 09/15/2017   History of Present Illness  [name] is a 47 y.o. male past medical history significant for type 2 diabetes, HFrEF, hypertension and recent left internal capsule stroke (admit on 09/06/17) who presented with left hand and face numbness as well as slurred speech concerning for acute stroke. MRI shows Acute infarct in the left thalamus, posterior basal ganglia, and lateral geniculate  Clinical Impression  Patient is a 47 y/o male admitted with the above listed diagnosis. Prior to admission, patient was Mod I with functional mobility with patient working full-time. Today patient requiring Min guard/Min A + verbal cueing for safety and sequencing of transfers and mobility to promote safety awareness. Some limitations in mobility due to pain, however at baseline. 1 episode of slight LOB requiring Min guard to ensure upright posture. PT currently recommending CIR to maximize safety and mobility. PT to continue to follow acutely.     Follow Up Recommendations CIR;Supervision/Assistance - 24 hour    Equipment Recommendations  Other (comment)(TBD)    Recommendations for Other Services Rehab consult     Precautions / Restrictions Precautions Precautions: Fall Restrictions Weight Bearing Restrictions: No      Mobility  Bed Mobility Overal bed mobility: Needs Assistance Bed Mobility: Sit to Supine     Supine to sit: HOB elevated;Min guard Sit to supine: Min assist   General bed mobility comments: minguard for safety, increased time   Transfers Overall transfer level: Needs assistance Equipment used: Rolling walker (2 wheeled);None Transfers: Sit to/from Stand Sit to Stand: Min guard;Min assist         General transfer comment: for safety; verbal cueing for hand placement for safety  Ambulation/Gait Ambulation/Gait assistance: Min guard Gait Distance  (Feet): 20 Feet(x 2 reps) Assistive device: Rolling walker (2 wheeled) Gait Pattern/deviations: Step-through pattern;Decreased stride length;Trunk flexed;Wide base of support Gait velocity: decreased   General Gait Details: limtied due to pain at R knee; Min guard for safety/stability  Stairs            Wheelchair Mobility    Modified Rankin (Stroke Patients Only) Modified Rankin (Stroke Patients Only) Pre-Morbid Rankin Score: No significant disability Modified Rankin: Moderately severe disability     Balance Overall balance assessment: Needs assistance Sitting-balance support: Feet supported;No upper extremity supported Sitting balance-Leahy Scale: Good     Standing balance support: Bilateral upper extremity supported;During functional activity Standing balance-Leahy Scale: Fair Standing balance comment: reliant on RW for support                             Pertinent Vitals/Pain Pain Assessment: Faces Faces Pain Scale: Hurts even more Pain Location: R knee Pain Descriptors / Indicators: Grimacing;Moaning Pain Intervention(s): Limited activity within patient's tolerance;Monitored during session;Repositioned    Home Living Family/patient expects to be discharged to:: Private residence Living Arrangements: Spouse/significant other Available Help at Discharge: Family;Available PRN/intermittently Type of Home: Mobile home Home Access: Stairs to enter Entrance Stairs-Rails: Can reach both Entrance Stairs-Number of Steps: 7 Home Layout: One level Home Equipment: None      Prior Function Level of Independence: Independent         Comments: Production designer, theatre/television/film at [employer]   Dominant Hand: Right    Extremity/Trunk Assessment   Upper Extremity Assessment Upper Extremity Assessment: Defer to OT evaluation RUE Deficits / Details: at baseline pt  with limited AROM (approx 0-100* with increased time/effort), increased effort for digit  opposition, numbness in R hand and digits RUE Sensation: decreased light touch    Lower Extremity Assessment Lower Extremity Assessment: Generalized weakness;RLE deficits/detail RLE Deficits / Details: R LE grossly 4/5 - some pain limiting, however this is baseline       Communication   Communication: Expressive difficulties(word finding)  Cognition Arousal/Alertness: Awake/alert Behavior During Therapy: WFL for tasks assessed/performed Overall Cognitive Status: Within Functional Limits for tasks assessed                                        General Comments      Exercises     Assessment/Plan    PT Assessment Patient needs continued PT services  PT Problem List Decreased strength;Decreased range of motion;Decreased activity tolerance;Decreased balance;Decreased mobility;Obesity;Decreased knowledge of use of DME;Decreased safety awareness       PT Treatment Interventions DME instruction;Gait training;Stair training;Functional mobility training;Therapeutic activities;Therapeutic exercise;Balance training;Neuromuscular re-education;Patient/family education    PT Goals (Current goals can be found in the Care Plan section)  Acute Rehab PT Goals Patient Stated Goal: return home, return to work PT Goal Formulation: With patient Time For Goal Achievement: 09/29/17 Potential to Achieve Goals: Good    Frequency Min 4X/week   Barriers to discharge Decreased caregiver support      Co-evaluation               AM-PAC PT "6 Clicks" Daily Activity  Outcome Measure Difficulty turning over in bed (including adjusting bedclothes, sheets and blankets)?: A Little Difficulty moving from lying on back to sitting on the side of the bed? : Unable Difficulty sitting down on and standing up from a chair with arms (e.g., wheelchair, bedside commode, etc,.)?: Unable Help needed moving to and from a bed to chair (including a wheelchair)?: A Little Help needed walking  in hospital room?: A Little Help needed climbing 3-5 steps with a railing? : A Lot 6 Click Score: 13    End of Session Equipment Utilized During Treatment: Gait belt Activity Tolerance: Patient tolerated treatment well Patient left: in bed;with call bell/phone within reach;with bed alarm set;with family/visitor present Nurse Communication: Mobility status PT Visit Diagnosis: Unsteadiness on feet (R26.81);Difficulty in walking, not elsewhere classified (R26.2);Pain;Hemiplegia and hemiparesis Hemiplegia - Right/Left: Right Hemiplegia - dominant/non-dominant: Dominant Hemiplegia - caused by: Cerebral infarction Pain - Right/Left: Right Pain - part of body: Knee    Time: 1331-1402 PT Time Calculation (min) (ACUTE ONLY): 31 min   Charges:   PT Evaluation $PT Eval Moderate Complexity: 1 Mod PT Treatments $Gait Training: 8-22 mins   PT G Codes:        Kipp Laurence, PT, DPT 09/15/17 2:21 PM

## 2017-09-15 NOTE — Progress Notes (Signed)
STROKE TEAM PROGRESS NOTE  HPI ( Dr Wilford Corner ) Jeremiah Vance is a 47 y.o. male past medical history of systolic CHF, chronic kidney disease, hypertension and a recent ischemic stroke involving the posterior limb of internal capsule on the left side with right-sided deficits, presenting to the emergency room for worsening right-sided weakness and slurred speech. He had a nearly exact similar presentation a week ago when he was found to be stuttering and weak on the right side by his coworkers.  He had a low NIH stroke scale and TPA was not given at that time. MRI revealed the stroke.  CT angios head and neck, dose of optimal in quality due to timing, did not show any significant stenosis or occlusion in the head and neck.  Echo showed LVEF 60 to 65% with LVH. He was discharged 5 days ago with aspirin and Plavix along with statin. He was in his usual state of health until this morning around 8:15 AM when he noticed complete flaccidity of the right arm. He was brought in as an acute code stroke.  LKW: 0815 09/14/2017 tpa given?: no, stroke 1 week ago. Premorbid modified Rankin scale (mRS): 2   INTERVAL HISTORY His family member is at the bedside.  He recounted HPI with me in detail He was recovering well from his recent stroke and at home and he developed sudden onset of worsening of his deficits. Repeat MRI shows an additional new left thalamic and basal ganglia infarcts adjacent to his recent internal capsule infarct  Vitals:   09/14/17 2345 09/15/17 0311 09/15/17 0832 09/15/17 1214  BP: 138/66 120/68 139/74 (!) 158/76  Pulse: 61 63 (!) 51 (!) 52  Resp: 19 18 18 18   Temp: 98.1 F (36.7 C) 97.8 F (36.6 C) 97.9 F (36.6 C) 97.7 F (36.5 C)  TempSrc: Oral Oral Oral Oral  SpO2: 100% 100% 100% 100%  Weight:      Height:        CBC:  Recent Labs  Lab 09/14/17 0854 09/14/17 0900 09/15/17 0618  WBC 8.1  --  7.6  NEUTROABS 4.7  --   --   HGB 13.6 13.9 12.5*  HCT 42.7 41.0 39.0  MCV  89.5  --  89.0  PLT 285  --  279    Basic Metabolic Panel:  Recent Labs  Lab 09/14/17 0854 09/14/17 0900 09/15/17 0618  NA 137 136 138  K 4.1 4.0 4.4  CL 99* 99* 103  CO2 24  --  28  GLUCOSE 143* 137* 103*  BUN 63* 56* 54*  CREATININE 2.49* 2.60* 1.80*  CALCIUM 9.0  --  8.3*   Lipid Panel:     Component Value Date/Time   CHOL 189 09/07/2017 0530   TRIG 81 09/07/2017 0530   HDL 45 09/07/2017 0530   CHOLHDL 4.2 09/07/2017 0530   VLDL 16 09/07/2017 0530   LDLCALC 128 (H) 09/07/2017 0530   HgbA1c:  Lab Results  Component Value Date   HGBA1C 6.9 (H) 09/08/2017   Urine Drug Screen:     Component Value Date/Time   LABOPIA NONE DETECTED 09/08/2017 1256   COCAINSCRNUR NONE DETECTED 09/08/2017 1256   LABBENZ NONE DETECTED 09/08/2017 1256   AMPHETMU NONE DETECTED 09/08/2017 1256   THCU NONE DETECTED 09/08/2017 1256   LABBARB NONE DETECTED 09/08/2017 1256    Alcohol Level No results found for: [facility]  IMAGING Mr Brain Wo Contrast  Result Date: 09/14/2017 CLINICAL DATA:  Focal neuro deficit less than  6 hours, suspect stroke EXAM: MRI HEAD WITHOUT CONTRAST TECHNIQUE: Multiplanar, multiecho pulse sequences of the brain and surrounding structures were obtained without intravenous contrast. COMPARISON:  CT head 09/14/2017 FINDINGS: Brain: Acute infarct left lateral thalamus and left posterior putamen. Small acute infarct left lateral geniculate. Chronic microvascular ischemic changes in the white matter and basal ganglia bilaterally. Chronic microhemorrhage in the thalamus bilaterally and left lateral basal ganglia. Chronic hemorrhage in the left parietal periventricular white matter. Ventricle size normal. Cerebral volume normal for age. No midline shift. Vascular: Normal arterial flow void Skull and upper cervical spine: Negative Sinuses/Orbits: Negative Other: None IMPRESSION: Acute infarct in the left thalamus, posterior basal ganglia, and lateral geniculate Moderate chronic  microvascular ischemia. Chronic areas of chronic microhemorrhage. Electronically Signed   By: Marlan Palau M.D.   On: 09/14/2017 12:36   Ct Head Code Stroke Wo Contrast  Result Date: 09/14/2017 CLINICAL DATA:  Code stroke. Focal neuro deficit. Right-sided weakness and slurred speech EXAM: CT HEAD WITHOUT CONTRAST TECHNIQUE: Contiguous axial images were obtained from the base of the skull through the vertex without intravenous contrast. COMPARISON:  CT head 09/08/2017 FINDINGS: Brain: No evidence of acute infarction, hemorrhage, hydrocephalus, extra-axial collection or mass lesion/mass effect. Vascular: Mild atherosclerotic disease. Negative for hyperdense vessel Skull: Negative Sinuses/Orbits: Negative Other: None ASPECTS ([place] Stroke Program Early CT Score) - Ganglionic level infarction (caudate, lentiform nuclei, internal capsule, insula, M1-M3 cortex): 7 - Supraganglionic infarction (M4-M6 cortex): 3 Total score (0-10 with 10 being normal): 10 IMPRESSION: 1. No acute intracranial abnormality 2. ASPECTS is 10 3. These results were called by telephone at the time of interpretation on 09/14/2017 at 9:06 am to Dr. Wilford Corner , who verbally acknowledged these results. Electronically Signed   By: Marlan Palau M.D.   On: 09/14/2017 09:07   2D Echocardiogram  - Left ventricle: The cavity size was moderately dilated. Wall thickness was increased in a pattern of mild LVH. Systolic function was normal. The estimated ejection fraction was in the range of 60% to 65%. - Mitral valve: Calcified annulus. Mildly thickened leaflets . Valve area by pressure half-time: 1.4 cm^2.  PHYSICAL EXAM   Pleasant middle-age Caucasian male currently not in distress. HEENT: [place]/AT Lungs: Respirations unlabored Ext: Chronic severe edema distal lower extremities bilaterally with thickened skin and rubor.   Neurologic Examination: Mental Status: Alert, oriented, thought content appropriate. Odd affect. Speech fluent without  evidence of aphasia; stuttering quality is most consistent with non-physiological origin. Able to follow all commands, name all objects and repeat a phrase. Cranial Nerves: II:  Visual fields full.  PERRL.  III,IV, VI: No ptosis. EOM are full.  Eyes slightly dysconjugate c/w exotropia (OS lower plane). No nystagmus.  V,VII: Smile asymmetric with mild right lower facial weakness, facial light touch sensation decreased on the right  VIII: Hearing intact to voice  IX,X: No hypophonia XI: Symmetric XII: No lingual dysarthria Motor: RUE: 3/5  LUE: 5/5 Mild pronator drift on either side but diminished fine finger movements on the right. Orbits left over right upper extremity. Tone normal bilat upper ext  right lower extremity 4/5 with mild hip exam ankle dorsiflexor weakness. Left lower extremity 5/5.  Tone normal bilat LE Poor cooperation with LE exam due to stated pain in right knee, dorsum of left foot and left toe. Sensory: Intact FT and temp sensation bilaterally. One instance of neglect with DSS on right.  Plantars: Right: [place]             Left:  [place] Cerebellar: No ataxia with FNF bilaterally Gait: Deferred due to acuity of presentation   ASSESSMENT/PLAN Mr. TEMPLE SCHANKE is a 47 y.o. male with history of diabetes, hypertension, morbid obesity presenting with right hemiparesis and stuttering speech.   Stroke:   Punctate L PLIC infarct secondary to small vessel disease  But now extension involving left thalamus and basal ganglia as well  Code Stroke CT head No acute stroke. ASPECTS 10.     MRI punctate L PLI C, old B basal ganglia and thalami lacunes, advanced WMD, possible vasculitis or sign small vessel disease. CTA head & neck Suboptimal CTA due to delayed timing and significant venous contrast.No significant carotid or vertebral artery stenosis in the neck. No significant intracranial stenosis. Negative for large vessel occlusion. Carotid doppler - canceled given CTA neck  added  2D Echo  EF 60-65%. No source of embolus   LDL 128  HgbA1c 6.9  UDS / ETOH level negative   Lovenox 40 mg sq daily for VTE prophylaxis  No antithrombotic prior to admission, now on aspirin 81 mg daily. Given mild stroke, recommend aspirin 81 mg and plavix 75 mg daily x 3 weeks, then aspirin alone.   Therapy recommendations:  OP PT, no OT  Disposition:  pending   Follow-up stroke clinic in 4 weeks.  Order placed.  Hypertension  Stable . Permissive hypertension (OK if < 220/120) but gradually normalize in 5-7 days . Long-term BP goal normotensive  Hyperlipidemia  Home meds:  No statin  LDL 128, goal < 70  Current statin:  Lipitor 40  Continue statin at discharge  Diabetes type II  HgbA1c 6.9, goal < 7.0  Controlled  Other Stroke Risk Factors  Obesity, Body mass index is 37.61 kg/m., recommend weight loss, diet and exercise as appropriate   Other Active Problems  Systolic heart failure  CKD stage III  Hospital day # 1    I have personally examined this patient, reviewed notes, independently viewed imaging studies, participated in medical decision making and plan of care.ROS completed by me personally and pertinent positives fully documented  I have made any additions or clarifications directly to the above note. [name] Kitchen He   presented with a small left internal capsule infarct due to lacunar disease about a week ago but unfortunately returned with worsening of his deficits with MRI not showing extension of his left subcortical lacunar infarcts. I have strongly counseled him to quit smoking completely and to maintain aggressive risk factor modification. continueDual antiplatelet therapy for 3 weeks followed by aspirin alone. Long discussion with patient and wife and answered questions.he will likely need transfer to inpatient rehabilitation.no further stroke workup is necessary. Recommend rehabilitation consult. Greater than 50% time during this 25 minute visit  was spent on counseling and coordination of care about his lacunar stroke in discussion about smoking cessation and aggressive risk factor modification for stroke prevention. Stroke team will sign off. Kindly call for questions if any. Delia Heady, MD Medical Director [facility] Pager: [phone] 09/15/2017 2:15 PM  To contact Stroke Continuity provider, please refer to [email or web address]. After hours, contact General Neurology

## 2017-09-15 NOTE — Evaluation (Signed)
Occupational Therapy Evaluation Patient Details Name: Jeremiah Vance MRN: [id number] DOB: [birth year] Today's Date: 09/15/2017    History of Present Illness Jeremiah Vance is a 47 y.o. male past medical history significant for type 2 diabetes, HFrEF, hypertension and recent left internal capsule stroke (admit on 09/06/17) who presented with left hand and face numbness as well as slurred speech concerning for acute stroke. MRI shows Acute infarct in the left thalamus, posterior basal ganglia, and lateral geniculate   Clinical Impression   This 47 y/o male presents with the above. At baseline pt is independent with ADLs and functional mobility. Pt completing room level functional mobility with MinA using RW this session, completing toileting and standing grooming ADLs with minguard assist and LB ADLs with MinA. Pt presenting with decreased fine motor/coordination in RUE and reports continued numbness in UE. Pt also with one episode of dizziness/lightheadedness when transitioning into standing from toilet requiring return to sitting prior to exiting bathroom (VSS during this time). Given pt's PLOF, feel he will benefit from continued therapy services at CIR level to maximize his safety and independence with ADLs, iADLs and functional mobility prior to return home. Will continue to follow acutely to progress pt towards established OT goals.    Follow Up Recommendations  CIR;Supervision/Assistance - 24 hour(24 hr initially )    Equipment Recommendations  Other (comment)(TBD in next venue )    Recommendations for Other Services       Precautions / Restrictions Precautions Precautions: Fall Restrictions Weight Bearing Restrictions: No      Mobility Bed Mobility Overal bed mobility: Needs Assistance Bed Mobility: Supine to Sit     Supine to sit: HOB elevated;Min guard     General bed mobility comments: minguard for safety, increased time   Transfers Overall transfer level: Needs  assistance Equipment used: Rolling walker (2 wheeled);None Transfers: Sit to/from Stand Sit to Stand: Min guard         General transfer comment: minguard for safety, initially stood without AD though pt reports having increased unsteadiness in standing therefore utilized RW before ambulating away from EOB, pt requires verbal cues to back completely up to seated surface when transitioning back to sitting     Balance Overall balance assessment: Needs assistance Sitting-balance support: Feet supported;No upper extremity supported Sitting balance-Leahy Scale: Good     Standing balance support: No upper extremity supported;During functional activity Standing balance-Leahy Scale: Fair Standing balance comment: able to static stand without UE support, requiring UE support for dynamic mobility at this time                            ADL either performed or assessed with clinical judgement   ADL Overall ADL's : Needs assistance/impaired Eating/Feeding: Set up;Sitting   Grooming: Min guard;Wash/dry hands;Standing Grooming Details (indicate cue type and reason): standing at sink  Upper Body Bathing: Min guard;Sitting   Lower Body Bathing: Min guard;Sit to/from stand   Upper Body Dressing : Min guard;Sitting   Lower Body Dressing: Minimal assistance;Sit to/from stand Lower Body Dressing Details (indicate cue type and reason): increased time/effort to don socks seated EOB, minA to fully don L sock Toilet Transfer: Min guard;Minimal assistance;Ambulation;Regular Toilet;Grab bars;RW   Toileting- Architect and Hygiene: Supervision/safety;Sit to/from stand Toileting - Clothing Manipulation Details (indicate cue type and reason): pt performing peri-care and clothing management      Functional mobility during ADLs: Min guard;Minimal assistance;Rolling walker General ADL Comments: pt  using RW this session due to increased unsteadiness in standing; ambulated to bathroom,  washed hands at sink and setup in recliner however pt reports again needing to attempt having BM, pt ambulated back to bathroom, though upon rising from toilet during second time pt reports increased lightheadedness and dizziness, returned to sitting for safety and brought recliner to bathroom door for pt to transfer to once dizziness subsided, VS taken and stable      Vision Baseline Vision/History: Wears glasses Wears Glasses: At all times Patient Visual Report: No change from baseline Vision Assessment?: Yes Eye Alignment: Within Functional Limits Ocular Range of Motion: Within Functional Limits Alignment/Gaze Preference: Within Defined Limits Tracking/Visual Pursuits: Able to track stimulus in all quads without difficulty;Decreased smoothness of horizontal tracking;Decreased smoothness of vertical tracking Visual Fields: No apparent deficits Additional Comments: increased time for scanning as pt often losing focus (multiple blinks), and requires increased time to regain focus and locate target, no apparent deficits during functional task completion      Perception     Praxis      Pertinent Vitals/Pain Pain Assessment: Faces Faces Pain Scale: Hurts little more Pain Location: R knee, back  Pain Descriptors / Indicators: Guarding;Sore Pain Intervention(s): Monitored during session;Limited activity within patient's tolerance;Repositioned     Hand Dominance Right   Extremity/Trunk Assessment Upper Extremity Assessment Upper Extremity Assessment: RUE deficits/detail RUE Deficits / Details: at baseline pt with limited AROM (approx 0-100* with increased time/effort), increased effort for digit opposition, numbness in R hand and digits RUE Sensation: decreased light touch   Lower Extremity Assessment Lower Extremity Assessment: Defer to PT evaluation       Communication Communication Communication: Other (comment)(some word finding difficulties )   Cognition Arousal/Alertness:  Awake/alert Behavior During Therapy: WFL for tasks assessed/performed Overall Cognitive Status: Within Functional Limits for tasks assessed                                                      Home Living Family/patient expects to be discharged to:: Private residence Living Arrangements: Spouse/significant other Available Help at Discharge: Family;Available PRN/intermittently Type of Home: Mobile home Home Access: Stairs to enter Entrance Stairs-Number of Steps: 7 Entrance Stairs-Rails: Can reach both Home Layout: One level     Bathroom Shower/Tub: Chief Strategy Officer: Standard     Home Equipment: None          Prior Functioning/Environment Level of Independence: Independent        CommentsHydrologist at [employer]        OT Problem List: Decreased activity tolerance;Impaired balance (sitting and/or standing);Decreased safety awareness;Decreased knowledge of use of DME or AE;Decreased knowledge of precautions;Pain;Decreased strength;Decreased range of motion;Decreased coordination;Impaired UE functional use;Impaired sensation      OT Treatment/Interventions: Self-care/ADL training;Therapeutic exercise;Energy conservation;DME and/or AE instruction;Therapeutic activities;Patient/family education;Balance training    OT Goals(Current goals can be found in the care plan section) Acute Rehab OT Goals Patient Stated Goal: go back to work OT Goal Formulation: With patient Time For Goal Achievement: 09/21/17 Potential to Achieve Goals: Good  OT Frequency: Min 2X/week    AM-PAC PT "6 Clicks" Daily Activity     Outcome Measure Help from another person eating meals?: None Help from another person taking care of personal grooming?: A Little Help from another person toileting, which includes using toliet,  bedpan, or urinal?: A Little Help from another person bathing (including washing, rinsing, drying)?: A Little Help from another person to  put on and taking off regular upper body clothing?: A Little Help from another person to put on and taking off regular lower body clothing?: A Little 6 Click Score: 19   End of Session Equipment Utilized During Treatment: Gait belt;Rolling walker Nurse Communication: Mobility status;Other (comment)(episode of lightheadedness/dizziness )  Activity Tolerance: Patient tolerated treatment well Patient left: in chair;with call bell/phone within reach;with chair alarm set  OT Visit Diagnosis: Unsteadiness on feet (R26.81);Other symptoms and signs involving the nervous system (R29.898)                Time: 1610-9604 OT Time Calculation (min): 33 min Charges:  OT General Charges $OT Visit: 1 Visit OT Evaluation $OT Eval Moderate Complexity: 1 Mod OT Treatments $Self Care/Home Management : 8-22 mins G-Codes:     Marcy Siren, OT Pager [phone] 09/15/2017   Orlando Penner 09/15/2017, 1:50 PM

## 2017-09-15 NOTE — Consult Note (Signed)
Physical Medicine and Rehabilitation Consult Reason for Consult: Right-sided weakness with slurred speech Referring Physician: Family medicine   HPI: Jeremiah Vance is a 47 y.o. right-handed male with history of diabetes mellitus, diastolic congestive heart failure, hypertension as well as recent left internal capsule infarction maintained on aspirin and Plavix and was hospitalized 09/06/2017 to 09/08/2017.  Per chart review patient lives with spouse.  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  One level home with 7 steps to entry.  Presented 09/14/2017 with increasing right-sided weakness and slurred speech.  Cranial CT scan negative for acute changes.  Patient did not receive TPA.  MRI showed acute infarction left thalamus, posterior basal ganglia and lateral geniculate.  Recent CT angiogram of head and neck suboptimal due to delayed timing and significant venous contrast.  No significant carotid or vertebral artery stenosis in the neck.  Negative for large vessel occlusion.  Incidental finding of small lung nodules on the right with recommendations of CT of the chest in the future.  Recent echocardiogram with ejection fraction of 98% systolic function was normal.  Patient currently remains on aspirin and Plavix therapy for CVA prophylaxis.  Tolerating a regular diet.  Physical and occupational therapy evaluations completed with recommendations of physical medicine rehab consult.   Review of Systems  Constitutional: Negative for chills and fever.  HENT: Negative for hearing loss.   Eyes: Negative for blurred vision and double vision.  Respiratory: Negative for cough and shortness of breath.   Cardiovascular: Positive for leg swelling. Negative for chest pain and palpitations.  Gastrointestinal: Positive for constipation. Negative for nausea and vomiting.  Genitourinary: Negative for dysuria, flank pain and hematuria.  Neurological: Positive for dizziness, sensory change, speech  change and focal weakness.  All other systems reviewed and are negative.  Past Medical History:  Diagnosis Date  . CKD (chronic kidney disease) stage 3, GFR 30-59 ml/min (HCC)   . History of cardiomyopathy    LVEF 40 to 45% in April 2019 - subsequently normalized  . Hypertension   . Ischemic stroke ([place])    Small left internal capsule infarct due to lacunar disease  . Morbid obesity ([place])   . Type 2 diabetes mellitus ([place])    Past Surgical History:  Procedure Laterality Date  . HERNIA REPAIR     Family History  Problem Relation Age of Onset  . Diabetes Father   . Heart disease Father   . Diabetes Sister   . Diabetes Mother   . Stroke Mother   . Diabetes Maternal Grandmother   . Heart disease Maternal Grandmother   . Stroke Paternal Grandfather   . Heart disease Paternal Grandfather    Social History:  reports that he has never smoked. He has never used smokeless tobacco. He reports that he does not drink alcohol. His drug history is not on file. Allergies: No Known Allergies Medications Prior to Admission  Medication Sig Dispense Refill  . acetaminophen (TYLENOL) 500 MG tablet Take 500 mg by mouth every 6 (six) hours as needed for mild pain or moderate pain.    [name] Kitchen aspirin 81 MG chewable tablet Chew 1 tablet (81 mg total) by mouth daily. 30 tablet 0  . atorvastatin (LIPITOR) 40 MG tablet Take 1 tablet (40 mg total) by mouth daily at 6 PM. 30 tablet 0  . calcium carbonate (TUMS EX) 750 MG chewable tablet Chew 2 tablets by mouth as needed for heartburn.    [name] Kitchen  carvedilol (COREG) 6.25 MG tablet Take 1 tablet (6.25 mg total) by mouth 2 (two) times daily. 60 tablet 3  . clopidogrel (PLAVIX) 75 MG tablet Take 1 tablet (75 mg total) by mouth daily. 30 tablet 0  . ibuprofen (ADVIL,MOTRIN) 200 MG tablet Take 1 tablet (200 mg total) by mouth every 8 (eight) hours as needed for mild pain or moderate pain. (Patient taking differently: Take 400-600 mg by mouth every 8 (eight) hours as needed  for mild pain (for knee pain). )    . lisinopril (PRINIVIL,ZESTRIL) 20 MG tablet Take 1 tablet (20 mg total) by mouth daily. 30 tablet 3  . metFORMIN (GLUCOPHAGE) 500 MG tablet Take 1 tablet (500 mg total) by mouth 2 (two) times daily with a meal. 60 tablet 2  . spironolactone (ALDACTONE) 25 MG tablet Take 1 tablet (25 mg total) by mouth daily. 30 tablet 3  . torsemide (DEMADEX) 20 MG tablet Take 2 tablets (40 mg total) by mouth daily. 60 tablet 4  . blood glucose meter kit and supplies KIT Dispense based on patient and insurance preference. Use up to four times daily as directed. (FOR ICD-9 250.00, 250.01). 1 each 0  . clotrimazole (LOTRIMIN) 1 % cream Apply topically 2 (two) times daily. Apply to suprapubic folds; keep area clean and dry (Patient not taking: Reported on 09/14/2017) 30 g 0    Home: Home Living Family/patient expects to be discharged to:: Private residence Living Arrangements: Spouse/significant other Available Help at Discharge: Family, Available PRN/intermittently Type of Home: Mobile home Home Access: Stairs to enter Technical brewer of Steps: 7 Entrance Stairs-Rails: Can reach both Home Layout: One level Bathroom Shower/Tub: Chiropodist: Standard Home Equipment: None  Functional History: Prior Function Level of Independence: Independent CommentsAstronomer at [employer]:  Mobility: Bed Mobility Overal bed mobility: Needs Assistance Bed Mobility: Sit to Supine Supine to sit: HOB elevated, Min guard Sit to supine: Min assist General bed mobility comments: minguard for safety, increased time  Transfers Overall transfer level: Needs assistance Equipment used: Rolling walker (2 wheeled), None Transfers: Sit to/from Stand Sit to Stand: Min guard, Min assist General transfer comment: for safety; verbal cueing for hand placement for safety Ambulation/Gait Ambulation/Gait assistance: Min guard Gait Distance (Feet): 20  Feet(x 2 reps) Assistive device: Rolling walker (2 wheeled) Gait Pattern/deviations: Step-through pattern, Decreased stride length, Trunk flexed, Wide base of support General Gait Details: limtied due to pain at R knee; Min guard for safety/stability Gait velocity: decreased    ADL: ADL Overall ADL's : Needs assistance/impaired Eating/Feeding: Set up, Sitting Grooming: Min guard, Wash/dry hands, Standing Grooming Details (indicate cue type and reason): standing at sink  Upper Body Bathing: Min guard, Sitting Lower Body Bathing: Min guard, Sit to/from stand Upper Body Dressing : Min guard, Sitting Lower Body Dressing: Minimal assistance, Sit to/from stand Lower Body Dressing Details (indicate cue type and reason): increased time/effort to don socks seated EOB, minA to fully don L sock Toilet Transfer: Min guard, Minimal assistance, Ambulation, Regular Toilet, Grab bars, RW Toileting- Clothing Manipulation and Hygiene: Supervision/safety, Sit to/from Sales promotion account executive Details (indicate cue type and reason): pt performing peri-care and clothing management  Functional mobility during ADLs: Min guard, Minimal assistance, Rolling walker General ADL Comments: pt using RW this session due to increased unsteadiness in standing; ambulated to bathroom, washed hands at sink and setup in recliner however pt reports again needing to attempt having BM, pt ambulated back to bathroom, though upon  rising from toilet during second time pt reports increased lightheadedness and dizziness, returned to sitting for safety and brought recliner to bathroom door for pt to transfer to once dizziness subsided, VS taken and stable   Cognition: Cognition Overall Cognitive Status: Within Functional Limits for tasks assessed Arousal/Alertness: Awake/alert Orientation Level: Oriented X4 Attention: Selective Selective Attention: Appears intact Memory: Appears intact Awareness: Appears  intact Problem Solving: Appears intact Cognition Arousal/Alertness: Awake/alert Behavior During Therapy: WFL for tasks assessed/performed Overall Cognitive Status: Within Functional Limits for tasks assessed  Blood pressure (!) 158/76, pulse (!) 52, temperature 97.7 F (36.5 C), temperature source Oral, resp. rate 18, height _0  (1.778 m), weight 118.9 kg (262 lb 2 oz), SpO2 100 %. Physical Exam  Constitutional: He appears well-developed and well-nourished.  HENT:  Head: Normocephalic and atraumatic.  Eyes: Pupils are equal, round, and reactive to light.  Neck: Normal range of motion.  Cardiovascular: Normal rate.  Respiratory: Effort normal.  GI: Soft.  Neurological: He is alert.  Patient follows commands and makes good eye contact with examiner.  He does have somewhat of a stuttering speech but intelligible, + word finding deficits. Follows all simple commands. RUE grossly 3/5 prox to distal. RLE 2/5 HF, 2-KE, 3/5 ADF/PF with motor planning issues. Also right knee limited by pain. Decreased sensation and dysesthesias right face, arm and to a lesser extent the right leg.  Skin:  Redness edema RLE/shin  Psychiatric:  Pleasant and cooperative    Results for orders placed or performed during the hospital encounter of 09/14/17 (from the past 24 hour(s))  Glucose, capillary     Status: None   Collection Time: 09/14/17  5:22 PM  Result Value Ref Range   Glucose-Capillary 83 65 - 99 mg/dL   Comment 1 Notify RN   Glucose, capillary     Status: Abnormal   Collection Time: 09/14/17 10:12 PM  Result Value Ref Range   Glucose-Capillary 122 (H) 65 - 99 mg/dL   Comment 1 Notify RN   Basic metabolic panel     Status: Abnormal   Collection Time: 09/15/17  6:18 AM  Result Value Ref Range   Sodium 138 135 - 145 mmol/L   Potassium 4.4 3.5 - 5.1 mmol/L   Chloride 103 101 - 111 mmol/L   CO2 28 22 - 32 mmol/L   Glucose, Bld 103 (H) 65 - 99 mg/dL   BUN 54 (H) 6 - 20 mg/dL   Creatinine, Ser  1.80 (H) 0.61 - 1.24 mg/dL   Calcium 8.3 (L) 8.9 - 10.3 mg/dL   GFR calc non Af Amer 43 (L) >60 mL/min   GFR calc Af Amer 50 (L) >60 mL/min   Anion gap 7 5 - 15  CBC     Status: Abnormal   Collection Time: 09/15/17  6:18 AM  Result Value Ref Range   WBC 7.6 4.0 - 10.5 K/uL   RBC 4.38 4.22 - 5.81 MIL/uL   Hemoglobin 12.5 (L) 13.0 - 17.0 g/dL   HCT 39.0 39.0 - 52.0 %   MCV 89.0 78.0 - 100.0 fL   MCH 28.5 26.0 - 34.0 pg   MCHC 32.1 30.0 - 36.0 g/dL   RDW 13.3 11.5 - 15.5 %   Platelets 279 150 - 400 K/uL  Glucose, capillary     Status: None   Collection Time: 09/15/17  6:52 AM  Result Value Ref Range   Glucose-Capillary 94 65 - 99 mg/dL  Glucose, capillary     Status:  Abnormal   Collection Time: 09/15/17 11:14 AM  Result Value Ref Range   Glucose-Capillary 116 (H) 65 - 99 mg/dL   Mr Brain Wo Contrast  Result Date: 09/14/2017 CLINICAL DATA:  Focal neuro deficit less than 6 hours, suspect stroke EXAM: MRI HEAD WITHOUT CONTRAST TECHNIQUE: Multiplanar, multiecho pulse sequences of the brain and surrounding structures were obtained without intravenous contrast. COMPARISON:  CT head 09/14/2017 FINDINGS: Brain: Acute infarct left lateral thalamus and left posterior putamen. Small acute infarct left lateral geniculate. Chronic microvascular ischemic changes in the white matter and basal ganglia bilaterally. Chronic microhemorrhage in the thalamus bilaterally and left lateral basal ganglia. Chronic hemorrhage in the left parietal periventricular white matter. Ventricle size normal. Cerebral volume normal for age. No midline shift. Vascular: Normal arterial flow void Skull and upper cervical spine: Negative Sinuses/Orbits: Negative Other: None IMPRESSION: Acute infarct in the left thalamus, posterior basal ganglia, and lateral geniculate Moderate chronic microvascular ischemia. Chronic areas of chronic microhemorrhage. Electronically Signed   By: Franchot Gallo M.D.   On: 09/14/2017 12:36   Ct Head  Code Stroke Wo Contrast  Result Date: 09/14/2017 CLINICAL DATA:  Code stroke. Focal neuro deficit. Right-sided weakness and slurred speech EXAM: CT HEAD WITHOUT CONTRAST TECHNIQUE: Contiguous axial images were obtained from the base of the skull through the vertex without intravenous contrast. COMPARISON:  CT head 09/08/2017 FINDINGS: Brain: No evidence of acute infarction, hemorrhage, hydrocephalus, extra-axial collection or mass lesion/mass effect. Vascular: Mild atherosclerotic disease. Negative for hyperdense vessel Skull: Negative Sinuses/Orbits: Negative Other: None ASPECTS ([place] Stroke Program Early CT Score) - Ganglionic level infarction (caudate, lentiform nuclei, internal capsule, insula, M1-M3 cortex): 7 - Supraganglionic infarction (M4-M6 cortex): 3 Total score (0-10 with 10 being normal): 10 IMPRESSION: 1. No acute intracranial abnormality 2. ASPECTS is 10 3. These results were called by telephone at the time of interpretation on 09/14/2017 at 9:06 am to Dr. Rory Percy , who verbally acknowledged these results. Electronically Signed   By: Franchot Gallo M.D.   On: 09/14/2017 09:07      Assessment/Plan: 1. Diagnosis: Left PLIC infarct with extension into the left thalamic, basal ganglia with resulting right hemiparesis, dysesthesias. Hx of OA right knee 2. Does the need for close, 24 hr/day medical supervision in concert with the patient's rehab needs make it unreasonable for this patient to be served in a less intensive setting? Yes 3. Co-Morbidities requiring supervision/potential complications: pain, htn, dm2, cellulitis RLE 4. Due to bladder management, bowel management, safety, skin/wound care, disease management, medication administration, pain management and patient education, does the patient require 24 hr/day rehab nursing? Yes 5. Does the patient require coordinated care of a physician, rehab nurse, PT (1-2 hrs/day, 5 days/week), OT (1-2 hrs/day, 5 days/week) and SLP (1-2 hrs/day, 5  days/week) to address physical and functional deficits in the context of the above medical diagnosis(es)? Yes Addressing deficits in the following areas: balance, endurance, locomotion, strength, transferring, bowel/bladder control, bathing, dressing, feeding, grooming, toileting, cognition, speech, language, swallowing and psychosocial support 6. Can the patient actively participate in an intensive therapy program of at least 3 hrs of therapy per day at least 5 days per week? Yes 7. The potential for patient to make measurable gains while on inpatient rehab is excellent 8. Anticipated functional outcomes upon discharge from inpatient rehab are modified independent  with PT, modified independent with OT, modified independent and supervision with SLP. 9. Estimated rehab length of stay to reach the above functional goals is: 12-18 days 10. Anticipated  D/C setting: Home 11. Anticipated post D/C treatments: HH therapy and Outpatient therapy 12. Overall Rehab/Functional Prognosis: excellent  RECOMMENDATIONS: This patient's condition is appropriate for continued rehabilitative care in the following setting: CIR Patient has agreed to participate in recommended program. Yes Note that insurance prior authorization may be required for reimbursement for recommended care.  Comment: Rehab Admissions Coordinator to follow up.  Thanks,  Meredith Staggers, MD, Mellody Drown  I have personally performed a face to face diagnostic evaluation of this patient. Additionally, I have reviewed and concur with the physician assistant's documentation above.    Lavon Paganini Angiulli, PA-C 09/15/2017

## 2017-09-16 LAB — CBC WITH DIFFERENTIAL/PLATELET
Abs Immature Granulocytes: 0 10*3/uL (ref 0.0–0.1)
BASOS PCT: 1 %
Basophils Absolute: 0.1 10*3/uL (ref 0.0–0.1)
EOS ABS: 0.4 10*3/uL (ref 0.0–0.7)
Eosinophils Relative: 5 %
HCT: 40.3 % (ref 39.0–52.0)
Hemoglobin: 12.9 g/dL — ABNORMAL LOW (ref 13.0–17.0)
Immature Granulocytes: 0 %
Lymphocytes Relative: 34 %
Lymphs Abs: 2.6 10*3/uL (ref 0.7–4.0)
MCH: 28.3 pg (ref 26.0–34.0)
MCHC: 32 g/dL (ref 30.0–36.0)
MCV: 88.4 fL (ref 78.0–100.0)
MONOS PCT: 13 %
Monocytes Absolute: 1.1 10*3/uL — ABNORMAL HIGH (ref 0.1–1.0)
Neutro Abs: 3.7 10*3/uL (ref 1.7–7.7)
Neutrophils Relative %: 47 %
PLATELETS: 272 10*3/uL (ref 150–400)
RBC: 4.56 MIL/uL (ref 4.22–5.81)
RDW: 13.2 % (ref 11.5–15.5)
WBC: 7.8 10*3/uL (ref 4.0–10.5)

## 2017-09-16 LAB — BASIC METABOLIC PANEL
Anion gap: 7 (ref 5–15)
BUN: 33 mg/dL — AB (ref 6–20)
CO2: 29 mmol/L (ref 22–32)
Calcium: 8.9 mg/dL (ref 8.9–10.3)
Chloride: 103 mmol/L (ref 101–111)
Creatinine, Ser: 1.34 mg/dL — ABNORMAL HIGH (ref 0.61–1.24)
GFR calc Af Amer: >60 mL/min (ref >60)
GLUCOSE: 107 mg/dL — AB (ref 65–99)
POTASSIUM: 4.5 mmol/L (ref 3.5–5.1)
Sodium: 139 mmol/L (ref 135–145)

## 2017-09-16 LAB — GLUCOSE, CAPILLARY
Glucose-Capillary: 107 mg/dL — ABNORMAL HIGH (ref 65–99)
Glucose-Capillary: 118 mg/dL — ABNORMAL HIGH (ref 65–99)
Glucose-Capillary: 90 mg/dL (ref 65–99)
Glucose-Capillary: 147 mg/dL — ABNORMAL HIGH (ref 65–99)

## 2017-09-16 NOTE — Progress Notes (Signed)
Inpatient Rehabilitation Admissions Coordinator  I met with patient, sister, niece and his Dad at bedside. We discussed goals and expectations of an inpt rehab admission. I also discussed cost of care for an inpt rehab admit as he is uninsured. I contacted Clovia Cuff with Financial counseling per pt request to contact him to discuss his possible coverage options.  Patient is requesting SNF for rehab due to cost of CIR admit. I have informed SW, Kathlee Nations, and we will sign off at this time. Please call me with any questions.  Danne Baxter, RN, MSN Rehab Admissions Coordinator [phone] 09/16/2017 12:30 PM

## 2017-09-16 NOTE — NC FL2 (Signed)
[place] MEDICAID FL2 LEVEL OF CARE SCREENING TOOL     IDENTIFICATION  Patient Name: Jeremiah Vance Birthdate: 05/10/1970 Sex: male Admission Date (Current Location): 09/14/2017  [facility] and [place] Number:  Producer, television/film/video and Address:  [facility], [address] [place], [place] [address]      Provider Number: [id number]  Attending Physician Name and Address:  Nestor Ramp, MD  Relative Name and Phone Number:       Current Level of Care: Hospital Recommended Level of Care: Skilled Nursing Facility Prior Approval Number:    Date Approved/Denied:   PASRR Number: [id number]  Discharge Plan: SNF    Current Diagnoses: Patient Active Problem List   Diagnosis Date Noted  . Stroke (HCC) 09/14/2017  . Acute right-sided weakness   . AKI (acute kidney injury) (HCC)   . Congestive heart failure (HCC)   . Hyperlipidemia   . Stroke (cerebrum) (HCC) 09/07/2017  . Gout   . Acute ischemic stroke (HCC) 09/06/2017  . Anasarca   . Elevated LFTs   . Acute systolic HF (heart failure) (HCC)   . Obesity, Class III, BMI 40-49.9 (morbid obesity) (HCC)   . Hypoalbuminemia 07/11/2017  . Tinea corporis 07/11/2017  . Volume overload 07/10/2017  . Hyperbilirubinemia 07/10/2017  . Anemia 07/10/2017  . Type 2 diabetes mellitus with complication, without long-term current use of insulin (HCC) 07/10/2017  . Hypertension 07/10/2017    Orientation RESPIRATION BLADDER Height & Weight     Self, Time, Situation, Place  Normal Continent Weight: 269 lb 6.4 oz (122.2 kg) Height:  5\' 10"  (177.8 cm)  BEHAVIORAL SYMPTOMS/MOOD NEUROLOGICAL BOWEL NUTRITION STATUS      Continent Diet(heart healthy, carb modified)  AMBULATORY STATUS COMMUNICATION OF NEEDS Skin   Limited Assist Verbally Other (Comment), Skin abrasions(bilateral lower leg cellulitis)                       Personal Care Assistance Level of Assistance  Bathing, Feeding, Dressing Bathing  Assistance: Limited assistance Feeding assistance: Independent Dressing Assistance: Limited assistance     Functional Limitations Info  Sight, Hearing, Speech Sight Info: Adequate Hearing Info: Adequate Speech Info: Adequate    SPECIAL CARE FACTORS FREQUENCY  PT (By licensed PT), OT (By licensed OT)     PT Frequency: 5x/wk OT Frequency: 5x/wk            Contractures Contractures Info: Not present    Additional Factors Info  Code Status, Allergies, Insulin Sliding Scale Code Status Info: Full Allergies Info: NKA   Insulin Sliding Scale Info: 0-9 units 3x/day with meals       Current Medications (09/16/2017):  This is the current hospital active medication list Current Facility-Administered Medications  Medication Dose Route Frequency Provider Last Rate Last Dose  .  stroke: mapping our early stages of recovery book   Does not apply Once Diallo, Abdoulaye, MD      . acetaminophen (TYLENOL) tablet 650 mg  650 mg Oral Q4H PRN Diallo, Abdoulaye, MD       Or  . acetaminophen (TYLENOL) solution 650 mg  650 mg Per Tube Q4H PRN Diallo, Abdoulaye, MD       Or  . acetaminophen (TYLENOL) suppository 650 mg  650 mg Rectal Q4H PRN Diallo, Abdoulaye, MD      . aspirin chewable tablet 81 mg  81 mg Oral Daily Diallo, Abdoulaye, MD   81 mg at 09/16/17 0916  . atorvastatin (  LIPITOR) tablet 40 mg  40 mg Oral q1800 Diallo, Abdoulaye, MD   40 mg at 09/15/17 1846  . clopidogrel (PLAVIX) tablet 75 mg  75 mg Oral Daily Diallo, Abdoulaye, MD   75 mg at 09/16/17 0916  . insulin aspart (novoLOG) injection 0-9 Units  0-9 Units Subcutaneous TID WC Diallo, Abdoulaye, MD         Discharge Medications: Please see discharge summary for a list of discharge medications.  Relevant Imaging Results:  Relevant Lab Results:   Additional Information SS#: [id number]  Baldemar Lenis, LCSW

## 2017-09-16 NOTE — Progress Notes (Signed)
Physical Therapy Treatment Patient Details Name: Jeremiah Vance MRN: [id number] DOB: [birth year] Today's Date: 09/16/2017    History of Present Illness Jeremiah Vance is a 47 y.o. male past medical history significant for type 2 diabetes, HFrEF, hypertension and recent left internal capsule stroke (admit on 09/06/17) who presented with left hand and face numbness as well as slurred speech concerning for acute stroke. MRI shows Acute infarct in the left thalamus, posterior basal ganglia, and lateral geniculate    PT Comments    Patient is making gradual progress toward PT goals. Pt continues to c/o R knee pain with mobility which pt reports is chronic. Pt required min guard/min A overall for mobility this session due to continued R side weakness and numbness. Continue to progress as tolerated.    Follow Up Recommendations  CIR;Supervision/Assistance - 24 hour     Equipment Recommendations  Other (comment)(TBD)    Recommendations for Other Services Rehab consult     Precautions / Restrictions Precautions Precautions: Fall Restrictions Weight Bearing Restrictions: No    Mobility  Bed Mobility Overal bed mobility: Needs Assistance Bed Mobility: Supine to Sit       Sit to supine: Min guard   General bed mobility comments: heavy reliance on bed rails and increased time and effort required  Transfers Overall transfer level: Needs assistance Equipment used: None Transfers: Sit to/from Stand Sit to Stand: Min assist;Min guard         General transfer comment: assist to power up into standing without using armrests on chair but min guard using arm rests and from EOB; cues for safety  Ambulation/Gait Ambulation/Gait assistance: Min guard;Min assist Gait Distance (Feet): 50 Feet Assistive device: 1 person hand held assist Gait Pattern/deviations: Step-through pattern;Decreased stride length;Wide base of support;Decreased step length - right;Decreased step length - left Gait  velocity: decreased   General Gait Details: initially pt ambulated ~58ft with HHA +1 and required assitance for balance and with short step lengths; pt is more steady with RW and seemed to need the extra UE support due to R knee pain   Stairs             Wheelchair Mobility    Modified Rankin (Stroke Patients Only) Modified Rankin (Stroke Patients Only) Pre-Morbid Rankin Score: No significant disability Modified Rankin: Moderately severe disability     Balance Overall balance assessment: Needs assistance Sitting-balance support: Feet supported;No upper extremity supported Sitting balance-Leahy Scale: Good     Standing balance support: During functional activity;Single extremity supported Standing balance-Leahy Scale: Fair                              Cognition Arousal/Alertness: Awake/alert Behavior During Therapy: WFL for tasks assessed/performed Overall Cognitive Status: Within Functional Limits for tasks assessed                                        Exercises Other Exercises Other Exercises: STS X2    General Comments General comments (skin integrity, edema, etc.): bilat LE redness      Pertinent Vitals/Pain Pain Assessment: Faces Faces Pain Scale: Hurts even more Pain Location: R knee Pain Descriptors / Indicators: Grimacing;Guarding;Sore Pain Intervention(s): Limited activity within patient's tolerance;Monitored during session;Repositioned    Home Living  Prior Function            PT Goals (current goals can now be found in the care plan section) Acute Rehab PT Goals Patient Stated Goal: return home, return to work PT Goal Formulation: With patient Time For Goal Achievement: 09/29/17 Potential to Achieve Goals: Good Progress towards PT goals: Progressing toward goals    Frequency    Min 4X/week      PT Plan Current plan remains appropriate    Co-evaluation               AM-PAC PT "6 Clicks" Daily Activity  Outcome Measure  Difficulty turning over in bed (including adjusting bedclothes, sheets and blankets)?: A Little Difficulty moving from lying on back to sitting on the side of the bed? : Unable Difficulty sitting down on and standing up from a chair with arms (e.g., wheelchair, bedside commode, etc,.)?: Unable Help needed moving to and from a bed to chair (including a wheelchair)?: A Little Help needed walking in hospital room?: A Little Help needed climbing 3-5 steps with a railing? : A Lot 6 Click Score: 13    End of Session Equipment Utilized During Treatment: Gait belt Activity Tolerance: Patient tolerated treatment well Patient left: with call bell/phone within reach;with family/visitor present;in chair;with chair alarm set Nurse Communication: Mobility status PT Visit Diagnosis: Unsteadiness on feet (R26.81);Difficulty in walking, not elsewhere classified (R26.2);Pain;Hemiplegia and hemiparesis Hemiplegia - Right/Left: Right Hemiplegia - dominant/non-dominant: Dominant Hemiplegia - caused by: Cerebral infarction Pain - Right/Left: Right Pain - part of body: Knee     Time: 1210-1230 PT Time Calculation (min) (ACUTE ONLY): 20 min  Charges:  $Gait Training: 8-22 mins                    G Codes:       Erline Levine, PTA Pager: [phone]     Carolynne Edouard 09/16/2017, 1:45 PM

## 2017-09-16 NOTE — Progress Notes (Signed)
Per CIR note patient unable to afford the CIR stay. Per CSW patient also is unable to pay out of pocket for SNF rehab stay since he does not have insurance. CM following for potential home with [facility] services.

## 2017-09-16 NOTE — Progress Notes (Addendum)
Family Medicine Teaching Service Daily Progress Note Intern Pager: [phone]  Patient name: Jeremiah Vance Medical record number: [id number] Date of birth: October 21, 1970 Age: 47 y.o. Gender: male  Primary Care Provider: Patient, No Pcp Per Consultants: Neurology Code Status: full  Pt Overview and Major Events to Date:  6/16 admitted to fpts  Assessment and Plan: Lillard D Aagard is a 47 y.o. male past medical history significant for type 2 diabetes, HFrEF, hypertension and recent left internal capsule stroke who presented with left hand and face numbness as well as slurred speech concerning for acute stroke.  Right hand/face numbness, slurred speech, acute, resolving Repeat MRI on 6/17 without any progression from previous on 6/16. Ok for [employer] from neurology perspective. Neuro/pt/ot recommend snf placement, unfortunately patient cannot afford this. Will turn now for snf placement. - continue asa, plavix, atorvastatin - f/u pt/ot recs - SW to assist with snf placement - medically ready for discharge  AKI Creatinine up to 2.6. Cr 1.05 at discharge on 6/10. Now down to 1.3 this am. Denies taking nsaids. Likely 2/2 to torsemide and lisinopril. Can likely hold these on dc given the improvement of EF on last echo. Can be restarted when appropriate by outpt pcp. 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 Much improved from previous admission. Saw outpatient cardiologist who recommended continuing his current regimen. Given his now 2nd aki could consider holding lisinopril on discharge. Would opt to continue ace and simply hold NSAID though. - daily weights - strict I/O - hold torsemide, spiro, lisinopril, coreg  Type 2 diabetes Patient last A1c was 6.9. Glucose 116 at last check. Carb-modified heart healthy diet. sSSI. -Hold metformin in  setting of AKI -sSSI -CBG AC and nightly  Hypertension AM blood pressure 158/76. Will allow anything less than 200SBP for permissive htn. - restart torsemide, spiro, lisinopril, coreg  FEN/GI: Lovenox when ok with cardiology PPx: heart healthy carb-modified  Disposition: pending clinical course  Subjective:  Doing well this morning. No complaints.  Objective: Temp:  [97.6 F (36.4 C)-98.3 F (36.8 C)] 98.1 F (36.7 C) (06/18 1341) Pulse Rate:  [51-59] 51 (06/18 1341) Resp:  [16-20] 20 (06/18 1341) BP: (130-172)/(70-80) 130/74 (06/18 1341) SpO2:  [99 %-100 %] 100 % (06/18 1341) Weight:  [269 lb 6.4 oz (122.2 kg)] 269 lb 6.4 oz (122.2 kg) (06/18 0016) Physical Exam: General: No Acute Distress, pleasant, comfortable caucasian male resting in bed Cardiac: RRR, normal heart sounds, no murmurs. 2+ radial and PT pulses bilaterally Respiratory: CTAB, normal effort, No wheezes, rales or rhonchi Abdomen: soft, nontender, nondistended, no hepatic or splenomegaly, +BS Extremities: no edema or cyanosis. WWP. Skin: warm and dry, no rashes noted Neuro: alert and oriented x3, still with mild weakness to RUE, RLE, numbness to right face Psych: Normal affect and mood  Laboratory: Recent Labs  Lab 09/14/17 0854 09/14/17 0900 09/15/17 0618 09/16/17 0631  WBC 8.1  --  7.6 7.8  HGB 13.6 13.9 12.5* 12.9*  HCT 42.7 41.0 39.0 40.3  PLT 285  --  279 272   Recent Labs  Lab 09/14/17 0854 09/14/17 0900 09/15/17 0618 09/16/17 0631  NA 137 136 138 139  K 4.1 4.0 4.4 4.5  CL 99* 99* 103 103  CO2 24  --  28 29  BUN 63* 56* 54* 33*  CREATININE 2.49* 2.60* 1.80*  1.34*  CALCIUM 9.0  --  8.3* 8.9  PROT 8.0  --   --   --   BILITOT 1.2  --   --   --   ALKPHOS 125  --   --   --   ALT 23  --   --   --   AST 27  --   --   --   GLUCOSE 143* 137* 103* 107*    Imaging/Diagnostic Tests: CLINICAL DATA:  Focal neuro deficit less than 6 hours, suspect stroke  EXAM: MRI HEAD WITHOUT  CONTRAST  TECHNIQUE: Multiplanar, multiecho pulse sequences of the brain and surrounding structures were obtained without intravenous contrast.  COMPARISON:  CT head 09/14/2017  FINDINGS: Brain: Acute infarct left lateral thalamus and left posterior putamen. Small acute infarct left lateral geniculate.  Chronic microvascular ischemic changes in the white matter and basal ganglia bilaterally. Chronic microhemorrhage in the thalamus bilaterally and left lateral basal ganglia. Chronic hemorrhage in the left parietal periventricular white matter.  Ventricle size normal. Cerebral volume normal for age. No midline shift.  Vascular: Normal arterial flow void  Skull and upper cervical spine: Negative  Sinuses/Orbits: Negative  Other: None  IMPRESSION: Acute infarct in the left thalamus, posterior basal ganglia, and lateral geniculate  Moderate chronic microvascular ischemia. Chronic areas of chronic microhemorrhage.   Electronically Signed   By: Marlan Palau M.D.   On: 09/14/2017 12:36  Myrene Buddy, MD 09/16/2017, 2:02 PM PGY-1, [facility] Family Medicine FPTS Intern pager: [phone], text pages welcome

## 2017-09-16 NOTE — Progress Notes (Signed)
  Speech Language Pathology Treatment: Cognitive-Linquistic  Patient Details Name: Jeremiah Vance MRN: [id number] DOB: 1970-11-17 Today's Date: 09/16/2017 Time: 3212-2482 SLP Time Calculation (min) (ACUTE ONLY): 8 min  Assessment / Plan / Recommendation Clinical Impression  Pt was seen for skilled ST targeting communication goals.  CIR MD arrived shortly after therapist's arrival and pt demonstrated mild dysfluencies during conversations with MD characterized by initial phoneme dysfluencies at the phrase and sentence level.  After MD left, pt reports that communication is very effortful for him at this time and he is beginning to experience tension when talking with unfamiliar listeners.  Dysfluencies were the same in structured versus nonstructured speech tasks and pt also demonstrated phoneme distortions and omissions when repeating words of increasing length; therefore, also suspect some extent of apraxia impacting functional communication as well.  SLP discussed compensatory techniques to improve fluency of speech, emphasizing those that would improve pt's control over breath cycles during speaking.  All questions were answered to pt's satisfaction at this time.  Continue per current plan of care.    HPI HPI: 47 year old with history of CKD, CHF, diabetes and a recent admission for stroke comes in with chief complaint of right-sided numbness, weakness and slurred speech. Patient states that about 10 minutes prior to him calling EMS he had sudden onset of right-sided weakness and numbness in the upper extremity and his face.  Patient also started stuttering -all of which were the exact presentation when he presented few days ago and was found to have new insular stroke. MRI showed acute infarct in the left thalamus, posterior basal ganglia, and lateral geniculate.      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

## 2017-09-17 LAB — BASIC METABOLIC PANEL
Anion gap: 4 — ABNORMAL LOW (ref 5–15)
BUN: 26 mg/dL — AB (ref 6–20)
CO2: 26 mmol/L (ref 22–32)
Calcium: 8.6 mg/dL — ABNORMAL LOW (ref 8.9–10.3)
Chloride: 107 mmol/L (ref 101–111)
Creatinine, Ser: 1.04 mg/dL (ref 0.61–1.24)
GFR calc Af Amer: >60 mL/min (ref >60)
GFR calc non Af Amer: >60 mL/min (ref >60)
GLUCOSE: 107 mg/dL — AB (ref 65–99)
Potassium: 4.6 mmol/L (ref 3.5–5.1)
Sodium: 137 mmol/L (ref 135–145)

## 2017-09-17 LAB — GLUCOSE, CAPILLARY
GLUCOSE-CAPILLARY: 115 mg/dL — AB (ref 65–99)
Glucose-Capillary: 108 mg/dL — ABNORMAL HIGH (ref 65–99)

## 2017-09-17 NOTE — Discharge Instructions (Signed)
You were admitted for a likely new stroke in the same area. While your deficits are taking longer to resolve the neurology team and ourselves are optimistic you will make a complete recovery. You will be going home with home health physical therapy. I have made a follow up appointment with our clinic for 6/24 a 1:30 with Dr. Sampson Goon. This will be a hospital follow up.

## 2017-09-17 NOTE — Clinical Social Work Note (Signed)
Clinical Social Work Assessment  Patient Details  Name: Jeremiah Vance MRN: [id number] Date of Birth: November 05, 1970  Date of referral:  09/16/17               Reason for consult:  Facility Placement                Permission sought to share information with:  Facility Sport and exercise psychologist, Family Supports Permission granted to share information::  Yes, Verbal Permission Granted  Name::        Agency::  SNF  Relationship::  Sister, Insurance risk surveyor Information:     Housing/Transportation Living arrangements for the past 2 months:  Single Family Home Source of Information:  Patient, Siblings, Parent Patient Interpreter Needed:  None Criminal Activity/Legal Involvement Pertinent to Current Situation/Hospitalization:  No - Comment as needed Significant Relationships:  Parents, Spouse, Siblings Lives with:  Self, Spouse Do you feel safe going back to the place where you live?  Yes Need for family participation in patient care:  No (Coment)  Care giving concerns:  Patient from home with wife who works and is unavailable to provide 24 hour support.   Social Worker assessment / plan:  CSW met with patient and family at bedside to discuss discharge plan. CSW discussed barriers, including lack of insurance and patient's income. CSW provided information on private pay for SNF, and will look into whether patient would qualify for any LOG placement.   Employment status:  [employer] information:  Self Pay (Medicaid Pending) PT Recommendations:  Inpatient Rehab Consult Information / Referral to community resources:  [place]  Patient/Family's Response to care:  Patient and family agreeable to SNF placement.  Patient/Family's Understanding of and Emotional Response to Diagnosis, Current Treatment, and Prognosis:  Patient and family discussed frustration with why the patient doesn't qualify for Medicaid, and why he wouldn't qualify for any charity. Patient acknowledged  that he is basically on his own because his wife doesn't help him with anything.  Emotional Assessment Appearance:  Appears stated age Attitude/Demeanor/Rapport:  Engaged Affect (typically observed):  Appropriate Orientation:  Oriented to Self, Oriented to Place, Oriented to  Time, Oriented to Situation Alcohol / Substance use:  Not Applicable Psych involvement (Current and /or in the community):  No (Comment)  Discharge Needs  Concerns to be addressed:  Care Coordination Readmission within the last 30 days:  Yes Current discharge risk:  Physical Impairment, Dependent with Mobility Barriers to Discharge:  Continued Medical Work up, Inadequate or no insurance   [place], [place] 09/17/2017, 9:01 AM

## 2017-09-17 NOTE — Plan of Care (Signed)
Patient has a very strong appetite. He nutrition has been maintained.

## 2017-09-17 NOTE — Progress Notes (Signed)
Occupational Therapy Treatment Patient Details Name: Jeremiah Vance MRN: [id number] DOB: 1970-06-27 Today's Date: 09/17/2017    History of present illness Karlin D Manygoats is a 47 y.o. male past medical history significant for type 2 diabetes, HFrEF, hypertension and recent left internal capsule stroke (admit on 09/06/17) who presented with left hand and face numbness as well as slurred speech concerning for acute stroke. MRI shows Acute infarct in the left thalamus, posterior basal ganglia, and lateral geniculate   OT comments  Pt progressing towards OT goals, presents supine in bed with multiple family members present, agreeable to tx session. Pt completing room level functional mobility using RW with MinA-minguard assist throughout session. Demonstrates standing grooming ADLs, LB dressing, and toileting with minguard throughout. Pt with increased R knee pain (chronic) which appears to be a big limiting factor. Also noted pt is not able to receive CIR/SNF services after discharge; have updated d/c recommendations to reflect. Pt will benefit from [facility] services after discharge to maximize his safety and independence wit ADLs and mobility at home. Will continue to follow acutely to progress pt towards established OT goals.   Follow Up Recommendations  Home health OT;Supervision/Assistance - 24 hour    Equipment Recommendations  3 in 1 bedside commode          Precautions / Restrictions Precautions Precautions: Fall Restrictions Weight Bearing Restrictions: No       Mobility Bed Mobility Overal bed mobility: Needs Assistance Bed Mobility: Supine to Sit     Supine to sit: HOB elevated;Min guard     General bed mobility comments: reliance on bed rails and increased time and effort required, HOB elevated   Transfers Overall transfer level: Needs assistance Equipment used: Rolling walker (2 wheeled) Transfers: Sit to/from Stand Sit to Stand: Min guard;Min assist         General  transfer comment: MinA to power up into standing from EOB due to R knee pain; minguard for sit<>stand from toilet     Balance Overall balance assessment: Needs assistance Sitting-balance support: Feet supported;No upper extremity supported Sitting balance-Leahy Scale: Good     Standing balance support: During functional activity;Single extremity supported Standing balance-Leahy Scale: Fair                             ADL either performed or assessed with clinical judgement   ADL Overall ADL's : Needs assistance/impaired     Grooming: Min guard;Wash/dry hands;Standing;Wash/dry face;Oral care               Lower Body Dressing: Supervision/safety;Sit to/from stand Lower Body Dressing Details (indicate cue type and reason): pt donning socks seated EOB; changing shorts while seated on toilet in bathroom without physical assist, supervision for safety  Toilet Transfer: Min guard;Ambulation;Regular Toilet;Grab bars;RW   Toileting- Architect and Hygiene: Supervision/safety;Sit to/from stand Toileting - Clothing Manipulation Details (indicate cue type and reason): pt performing peri-care and clothing management      Functional mobility during ADLs: Min guard;Rolling walker                         Cognition Arousal/Alertness: Awake/alert Behavior During Therapy: WFL for tasks assessed/performed Overall Cognitive Status: Within Functional Limits for tasks assessed  Pertinent Vitals/ Pain       Pain Assessment: Faces Faces Pain Scale: Hurts little more Pain Location: R knee Pain Descriptors / Indicators: Grimacing;Guarding;Sore Pain Intervention(s): Monitored during session  Home Living                                          Prior Functioning/Environment              Frequency  Min 2X/week        Progress Toward Goals  OT  Goals(current goals can now be found in the care plan section)  Progress towards OT goals: Progressing toward goals  Acute Rehab OT Goals Patient Stated Goal: return home, return to work OT Goal Formulation: With patient Time For Goal Achievement: 09/21/17 Potential to Achieve Goals: Good  Plan Discharge plan needs to be updated                     AM-PAC PT "6 Clicks" Daily Activity     Outcome Measure   Help from another person eating meals?: None Help from another person taking care of personal grooming?: A Little Help from another person toileting, which includes using toliet, bedpan, or urinal?: A Little Help from another person bathing (including washing, rinsing, drying)?: A Little Help from another person to put on and taking off regular upper body clothing?: A Little Help from another person to put on and taking off regular lower body clothing?: A Little 6 Click Score: 19    End of Session Equipment Utilized During Treatment: Gait belt;Rolling walker  OT Visit Diagnosis: Unsteadiness on feet (R26.81);Other symptoms and signs involving the nervous system (R29.898)   Activity Tolerance Patient tolerated treatment well   Patient Left in chair;with call bell/phone within reach;with chair alarm set   Nurse Communication Mobility status        Time: 1127-1150 OT Time Calculation (min): 23 min  Charges: OT General Charges $OT Visit: 1 Visit OT Treatments $Self Care/Home Management : 8-22 mins  Marcy Siren, OT Pager [phone] 09/17/2017    Orlando Penner 09/17/2017, 1:53 PM

## 2017-09-17 NOTE — Progress Notes (Signed)
  Speech Language Pathology Treatment: Cognitive-Linquistic  Patient Details Name: Jeremiah Vance MRN: [id number] DOB: 04-Oct-1970 Today's Date: 09/17/2017 Time: 1500-1510 SLP Time Calculation (min) (ACUTE ONLY): 10 min  Assessment / Plan / Recommendation Clinical Impression  Pt was seen for skilled ST targeting communication goals.  Pt presents with improved speech fluency today in comparison to yesterday's therapy session with subjectively fewer initial phoneme repetitions.  Pt also appears less tense today during conversations with therapist.  Dysfluencies still persist during structured tasks but motor planning errors are less evident.  Pt needed supervision cues for recall of communication strategies discussed in yesterday's therapy session.  Pt was left in bed with bed alarm set and nurse at bedside reviewing discharge recommendations as pt's discharge plans have changed and he is not considered a candidate for CIR or SNF.  Pt will discharge to his sister's home with [facility] ST.  All questions were answered to pt's satisfaction at this time.    HPI HPI: 47 year old with history of CKD, CHF, diabetes and a recent admission for stroke comes in with chief complaint of right-sided numbness, weakness and slurred speech. Patient states that about 10 minutes prior to him calling EMS he had sudden onset of right-sided weakness and numbness in the upper extremity and his face.  Patient also started stuttering -all of which were the exact presentation when he presented few days ago and was found to have new insular stroke. MRI showed acute infarct in the left thalamus, posterior basal ganglia, and lateral geniculate.      SLP Plan  Continue with current plan of care       Recommendations                   Follow up Recommendations: Home health SLP SLP Visit Diagnosis: Other (comment);Apraxia (R48.2)(dysfluency) Plan: Continue with current plan of care       GO                Tamas Suen,  Melanee Spry 09/17/2017, 3:14 PM

## 2017-09-17 NOTE — Progress Notes (Signed)
[facility] Discharge Summary  Patient name: Jeremiah Vance record number: [id number] Date of birth: 01-21-1971 Age: 47 y.o. 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 2+ radial and PT pulses bilaterally Respiratory: CTAB, normal effort, No wheezes, rales or rhonchi Abdomen: soft, nontender, nondistended, no hepatic or splenomegaly, +BS Extremities:no edema or cyanosis. WWP. Skin:warm and dry, no rashes noted Neuro: alert and oriented x3, still with mild weakness to RUE, RLE, numbness to right face Psych:Normal affect and mood  Brief Hospital Course:  47 year old who presented on 6/16 with right arm, face, and leg weakness. He is a readmission who was originally admitted on 6/08 for similar symptoms. Please see discharge summary from that admission for more details. Patient admitted for possible stroke workup. Patient had a head MRI done which showed acute infarct in the left thalamus, posterior basal ganglia, and lateral geniculate.Neurology consulted who felt that this could be residual from last stroke since it had only occurred 9 days earlier in the same spot.  Stroke workup Patient had echo, CTA, and telemetry which was unrevealing from last admission. Decision was made to not repeat these tests. Patient already on aspirin and plavix and statin. No further workup necessary from neurology standpoint and was felt to have  optimal secondary prevention. Patient did have residual deficits this time. PT/OT/neuro recommended cir placement but unfortunately patient could not afford this due to his lack of insurance. It was a similar story for snf placement. He was discharged with home health pt  Acute Kidney Injury Patient presented with a cr of 2.6. His baseline is around 1.0. Felt to be due to a combination of nsaids, lisinopril, and his toresmide. We decreased his dose of lisinopril from 62m to 227m Would benefit from creatinine recheck at follow up. His cr was 1.05 at dc.  Systolic heart failure Echo from 06/2017 with EF 40-45%. This dramatically improved at his last admission, which showed EF of 60-65%. On appropriate systolic heart failure regimen. Given that patient's EF is now doing much better do not want to change regimen but do not want to cause another aki. Decreased lisinopril dose. Could consider stopping if still evidence of kidney injury on f/u bmp.  Issues for Follow Up:  1. Check Right arm, leg, face deficits, see if resolved 2. [facility] or would like to go elsewhere long term 3. Ensure patient able to get charity HH PT 4. Check creatinine, if elevated would consider holding lisinopril  Significant Procedures: none  Significant Labs and Imaging:  Recent Labs  Lab 09/14/17 0854 09/14/17 0900 09/15/17 0618 09/16/17 0631  WBC 8.1  --  7.6 7.8  HGB 13.6 13.9 12.5* 12.9*  HCT 42.7 41.0 39.0 40.3  PLT 285  --  279 272   Recent Labs  Lab 09/14/17 0854 09/14/17 0900 09/15/17 0618 09/16/17 0631 09/17/17 0703  NA 137 136 138 139 137  K 4.1 4.0 4.4 4.5 4.6  CL 99* 99* 103 103 107  CO2 24  --  _0 GLUCOSE 143* 137* 103* 107* 107*  BUN 63* 56* 54* 33* 26*  CREATININE 2.49* 2.60* 1.80* 1.34* 1.04  CALCIUM 9.0  --  8.3* 8.9 8.6*  ALKPHOS 125  --   --   --   --   AST 27  --   --   --   --   ALT 23  --   --   --   --   ALBUMIN 3.4*  --   --   --   --     Results/Tests Pending at Time of Discharge:   Discharge Medications:  Allergies as of 09/17/2017   No Known Allergies     Medication List    STOP taking these medications   ibuprofen 200 MG tablet Commonly known as:  ADVIL,MOTRIN     TAKE these medications   acetaminophen 500 MG tablet Commonly known as:  TYLENOL Take 500 mg by mouth every 6 (six) hours as needed for mild pain or moderate pain.   aspirin 81 MG chewable tablet Chew 1 tablet (81 mg total) by mouth daily.   atorvastatin 40 MG tablet Commonly known as:  LIPITOR Take 1 tablet (40 mg total) by mouth daily at 6 PM.   blood glucose meter kit and supplies Kit Dispense based on patient and insurance preference. Use up to four times daily as directed. (FOR ICD-9 250.00, 250.01).   calcium carbonate 750 MG chewable tablet Commonly known as:  TUMS EX Chew 2 tablets by mouth as needed for heartburn.   carvedilol 6.25 MG tablet Commonly known as:  COREG Take 1 tablet (6.25 mg total) by mouth 2 (two) times daily.   clopidogrel 75 MG tablet Commonly known as:  PLAVIX Take 1 tablet (75 mg total) by mouth daily.   clotrimazole 1 % cream Commonly known as:  LOTRIMIN Apply topically 2 (two) times daily. Apply to suprapubic folds; keep area clean and dry   lisinopril 20 MG tablet Commonly known as:  PRINIVIL,ZESTRIL Take 1 tablet (20 mg total) by mouth daily.   metFORMIN 500 MG tablet Commonly known as:  GLUCOPHAGE Take 1 tablet (500 mg total) by mouth 2 (two) times daily with a meal.   spironolactone 25 MG tablet Commonly known as:  ALDACTONE Take 1 tablet (25 mg total) by mouth daily.   torsemide 20 MG tablet Commonly known as:  DEMADEX Take 2 tablets (40 mg total) by mouth daily.            Durable Medical Equipment  (From admission, onward)        Start     Ordered   09/17/17 1112  For home use only DME 3 n 1  Once     09/17/17 1113   09/17/17 1112  For home use only DME Walker rolling  Once     Question:  Patient needs a walker to treat with the following condition  Answer:  Stroke [facility])   09/17/17 1113      Discharge Instructions: Please refer to Patient Instructions section of EMR for full details.  Patient was counseled important signs and symptoms that should prompt return to medical care, changes in medications, dietary instructions, activity restrictions, and follow up appointments.   Follow-Up Appointments: Follow-up Information    Rogue Bussing, MD Follow up on 09/22/2017.   Specialty:  Family Medicine Why:  Please keep your hospital follow up visit with Dr. Ola Spurr on 6/24 @ 130PM Contact information: [address]  [place] [address] [phone]           Guadalupe Dawn, MD 09/17/2017, 5:35 PM PGY-1, [place]

## 2017-09-17 NOTE — Progress Notes (Signed)
CSW met with patient and patient's sister at bedside to inform them that he doesn't qualify for any charity support for admission to SNF, it would be full private pay. Patient is unable to do that, especially with needing to provide a 30 day deposit up front. Patient was upset but indicated understanding. CSW alerted RNCM, who will see if patient qualifies for charity home health services.   CSW signing off.  Laveda Abbe,  Clinical Social Worker [phone]

## 2017-09-17 NOTE — Care Management Note (Addendum)
Case Management Note  Patient Details  Name: Jeremiah Vance MRN: [id number] Date of Birth: 16-Mar-1971  Subjective/Objective:                    Action/Plan: Pt is discharging home with [facility] services. Pt is without insurance so [facility] notified to see if patient qualifies for charity HH.  Pt with orders for walker and 3 in 1. [name] with [facility] notified and will have the equipment deliver to the patient's room. Pt without a PCP. [facility] has agreed to see him in the clinic.  Pt has transportation home.   Addendum:  Pt denied for charity [facility] services d/t the income of the home. CM spoke with CSW AD and they are willing to do a LOG for [facility] services for PT/OT twice a week for 2 weeks. Pt updated. The [facility] agency will be [facility] and [name] aware.  Pt will be discharging to his sisters home: [address] in [place], [place] [address]   Expected Discharge Date:                  Expected Discharge Plan:  Home w Home Health Services  In-House Referral:     Discharge planning Services  CM Consult  Post Acute Care Choice:  Durable Medical Equipment, Home Health Choice offered to:  Patient  DME Arranged:  3-N-1, Walker rolling DME Agency:  [facility]  HH Arranged:  PT, OT, Social Work, Facilities manager Therapy HH Agency:  [facility](charity services)  Status of Service:  Completed, signed off  If discussed at [employer], dates discussed:    Additional Comments:  Kermit Balo, RN 09/17/2017, 12:18 PM

## 2017-09-17 NOTE — Progress Notes (Signed)
Physical Therapy Treatment Patient Details Name: Jeremiah Vance MRN: [id number] DOB: 10/12/1970 Today's Date: 09/17/2017    History of Present Illness Jeremiah Vance is a 47 y.o. male past medical history significant for type 2 diabetes, HFrEF, hypertension and recent left internal capsule stroke (admit on 09/06/17) who presented with left hand and face numbness as well as slurred speech concerning for acute stroke. MRI shows Acute infarct in the left thalamus, posterior basal ganglia, and lateral geniculate    PT Comments    Patient is making progress toward PT goals and able to safely ascend/descend stairs simulating sister's home entrance. Continue to progress as tolerated.    Follow Up Recommendations  CIR;Supervision/Assistance - 24 hour     Equipment Recommendations  Rolling walker with 5" wheels;3in1 (PT)    Recommendations for Other Services Rehab consult     Precautions / Restrictions Precautions Precautions: Fall Restrictions Weight Bearing Restrictions: No    Mobility  Bed Mobility Overal bed mobility: Modified Independent Bed Mobility: Supine to Sit     Supine to sit: HOB elevated;Min guard     General bed mobility comments: use of rail and increased time  Transfers Overall transfer level: Needs assistance Equipment used: Rolling walker (2 wheeled) Transfers: Sit to/from Stand Sit to Stand: Min guard         General transfer comment: min guard for safety  Ambulation/Gait Ambulation/Gait assistance: Min guard   Assistive device: Rolling walker (2 wheeled) Gait Pattern/deviations: Step-through pattern;Decreased stride length;Decreased step length - right;Decreased step length - left Gait velocity: decreased   General Gait Details: cues for sequencing, posture, and proximity to [employer] assistance: Min guard Stair Management: Step to pattern;Forwards;One rail Right;No rails;Backwards;With walker Number of Stairs: (2 steps forward  with R hand rail and 1 step backwards with R) General stair comments: cues for sequencing adn assist to stabilize RW    Wheelchair Mobility    Modified Rankin (Stroke Patients Only) Modified Rankin (Stroke Patients Only) Pre-Morbid Rankin Score: No significant disability Modified Rankin: Moderately severe disability     Balance Overall balance assessment: Needs assistance Sitting-balance support: Feet supported;No upper extremity supported Sitting balance-Leahy Scale: Good     Standing balance support: During functional activity;Single extremity supported Standing balance-Leahy Scale: Fair                              Cognition Arousal/Alertness: Awake/alert Behavior During Therapy: WFL for tasks assessed/performed Overall Cognitive Status: Within Functional Limits for tasks assessed                                        Exercises      General Comments        Pertinent Vitals/Pain Pain Assessment: Faces Faces Pain Scale: Hurts a little bit Pain Location: R knee Pain Descriptors / Indicators: Grimacing;Guarding;Sore Pain Intervention(s): Monitored during session;Repositioned    Home Living                      Prior Function            PT Goals (current goals can now be found in the care plan section) Acute Rehab PT Goals Patient Stated Goal: return home, return to work PT Goal Formulation: With patient Time For Goal Achievement: 09/29/17 Potential to Achieve Goals: Good  Progress towards PT goals: Progressing toward goals    Frequency    Min 4X/week      PT Plan Current plan remains appropriate    Co-evaluation              AM-PAC PT "6 Clicks" Daily Activity  Outcome Measure  Difficulty turning over in bed (including adjusting bedclothes, sheets and blankets)?: A Little Difficulty moving from lying on back to sitting on the side of the bed? : Unable Difficulty sitting down on and standing up from a  chair with arms (e.g., wheelchair, bedside commode, etc,.)?: Unable Help needed moving to and from a bed to chair (including a wheelchair)?: A Little Help needed walking in hospital room?: A Little Help needed climbing 3-5 steps with a railing? : A Lot 6 Click Score: 13    End of Session Equipment Utilized During Treatment: Gait belt Activity Tolerance: Patient tolerated treatment well Patient left: with call bell/phone within reach;with family/visitor present(pt sitting EOB) Nurse Communication: Mobility status PT Visit Diagnosis: Unsteadiness on feet (R26.81);Difficulty in walking, not elsewhere classified (R26.2);Pain;Hemiplegia and hemiparesis Hemiplegia - Right/Left: Right Hemiplegia - dominant/non-dominant: Dominant Hemiplegia - caused by: Cerebral infarction Pain - Right/Left: Right Pain - part of body: Knee     Time: 1624-4695 PT Time Calculation (min) (ACUTE ONLY): 24 min  Charges:  $Gait Training: 8-22 mins $Therapeutic Activity: 8-22 mins                    G Codes:       Erline Levine, PTA Pager: [phone]     Carolynne Edouard 09/17/2017, 4:51 PM

## 2017-09-22 ENCOUNTER — Telehealth: Payer: Self-pay | Admitting: *Deleted

## 2017-09-22 ENCOUNTER — Inpatient Hospital Stay: Payer: Self-pay | Admitting: Internal Medicine

## 2017-09-22 NOTE — Telephone Encounter (Signed)
Jeremiah Vance at [facility] Provided verbal orders. Patient has coverage for OT and PT through [facility] [employer] and can only be approved for 2 weeks.

## 2017-09-22 NOTE — Telephone Encounter (Signed)
Jeremiah Vance Needs verbal orders for 2 X weekly for 2 weeks.  Pt has a new pt appt on 09/25/17 with Dr. Sampson Goon.  Jeremiah Vance, [name], CMA

## 2017-09-22 NOTE — Discharge Summary (Signed)
[facility] Discharge Summary  Patient name: Jeremiah Vance record number: [id number] Date of birth: Jul 11, 1970        Age: 47 y.o.    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 2+ radial and PT pulses bilaterally Respiratory: CTAB, normal effort, No wheezes, rales or rhonchi Abdomen: soft, nontender, nondistended, no hepatic or splenomegaly, +BS Extremities:no edema or cyanosis. WWP. Skin:warm and dry, no rashes noted Neuro: alert and oriented x3, still with mild weakness to RUE, RLE, numbness to right face Psych:Normal affect and mood  Brief Hospital Course:  47 year old who presented on 6/16 with right arm, face, and leg weakness. He is a readmission who was originally admitted on 6/08 for similar symptoms. Please see discharge summary from that admission for more details. Patient admitted for possible stroke workup. Patient had a head MRI done which showed acute infarct in the left thalamus, posterior basal ganglia, and lateral geniculate.Neurology consulted who felt that this could be residual from last stroke since it had only occurred 9 days earlier in the same spot.  Stroke workup Patient had echo, CTA, and telemetry which was unrevealing from last admission. Decision was made to not repeat these tests. Patient already on aspirin and plavix and statin. No further workup necessary  from neurology standpoint and was felt to have optimal secondary prevention. Patient did have residual deficits this time. PT/OT/neuro recommended cir placement but unfortunately patient could not afford this due to his lack of insurance. It was a similar story for snf placement. He was discharged with home health pt  Acute Kidney Injury Patient presented with a cr of 2.6. His baseline is around 1.0. Felt to be due to a combination of nsaids, lisinopril, and his toresmide. We decreased his dose of lisinopril from 74m to 271m Would benefit from creatinine recheck at follow up. His cr was 1.05 at dc.  Systolic heart failure Echo from 06/2017 with EF 40-45%. This dramatically improved at his last admission, which showed EF of 60-65%. On appropriate systolic heart failure regimen. Given that patient's EF is now doing much better do not want to change regimen but do not want to cause another aki. Decreased lisinopril dose. Could consider stopping if still evidence of kidney injury on f/u bmp.  Issues for Follow Up:  1. Check Right arm, leg, face deficits, see if resolved 2. Check if [facility] or would like to go elsewhere long term 3. Ensure patient able to get charity HH PT 4. Check creatinine, if elevated would consider holding lisinopril  Significant Procedures: none  Significant Labs and Imaging:  LastLabs  Recent Labs  Lab 09/14/17 0854 09/14/17 0900 09/15/17 0618 09/16/17 0631  WBC 8.1  --  7.6 7.8  HGB 13.6 13.9 12.5* 12.9*  HCT 42.7 41.0 39.0 40.3  PLT 285  --  279 272  LastLabs         Recent Labs  Lab 09/14/17 0854 09/14/17 0900 09/15/17 0618 09/16/17 0631 09/17/17 0703  NA 137 136 138 139 137  K 4.1 4.0 4.4 4.5 4.6  CL 99* 99* 103 103 107  CO2 24  --  28 29 26  GLUCOSE 143* 137* 103* 107* 107*  BUN 63* 56* 54* 33* 26*  CREATININE 2.49* 2.60* 1.80* 1.34* 1.04  CALCIUM 9.0  --  8.3* 8.9 8.6*  ALKPHOS 125  --   --   --   --    AST 27  --   --   --   --   ALT 23  --   --   --   --   ALBUMIN 3.4*  --   --   --   --      Results/Tests Pending at Time of Discharge:   Discharge Medications:  Allergies as of 09/17/2017   No Known Allergies            Medication List           STOP taking these medications          ibuprofen 200 MG tablet Commonly known as:  ADVIL,MOTRIN                   TAKE these medications          acetaminophen 500 MG tablet Commonly known as:  TYLENOL Take 500 mg by mouth every 6 (six) hours as needed for mild pain or moderate pain.   aspirin 81 MG chewable tablet Chew 1 tablet (81 mg total) by mouth daily.   atorvastatin 40 MG tablet Commonly known as:  LIPITOR Take 1 tablet (40 mg total) by mouth daily at 6 PM.   blood glucose meter kit and supplies Kit Dispense based on patient and insurance preference. Use up to four times daily as directed. (FOR ICD-9 250.00, 250.01).   calcium carbonate 750 MG chewable tablet Commonly known as:  TUMS EX Chew 2 tablets by mouth as needed for heartburn.   carvedilol 6.25 MG tablet Commonly known as:  COREG Take 1 tablet (6.25 mg total) by mouth 2 (two) times daily.   clopidogrel 75 MG tablet Commonly known as:  PLAVIX Take 1 tablet (75 mg total) by mouth daily.   clotrimazole 1 % cream Commonly known as:  LOTRIMIN Apply topically 2 (two) times daily. Apply to suprapubic folds; keep area clean and dry   lisinopril 20 MG tablet Commonly known as:  PRINIVIL,ZESTRIL Take 1 tablet (20 mg total) by mouth daily.   metFORMIN 500 MG tablet Commonly known as:  GLUCOPHAGE Take 1 tablet (500 mg total) by mouth 2 (two) times daily with a meal.   spironolactone 25 MG tablet Commonly known as:  ALDACTONE Take 1 tablet (25 mg total) by mouth daily.   torsemide 20 MG tablet Commonly known as:  DEMADEX Take 2 tablets (40 mg total) by mouth daily.                                 Durable Medical  Equipment  (From admission, onward)               Start     Ordered   09/17/17 1112  For home use only DME 3 n 1  Once     09/17/17 1113   09/17/17 1112    For home use only DME Walker rolling  Once    Question:  Patient needs a walker to treat with the following condition  Answer:  Stroke [facility])   09/17/17 1113      Discharge Instructions: Please refer to Patient Instructions section of EMR for full details.  Patient was counseled important signs and symptoms that should prompt return to medical care, changes in medications, dietary instructions, activity restrictions, and follow up appointments.   Follow-Up Appointments: Follow-up Information    Rogue Bussing, MD Follow up on 09/22/2017.   Specialty:  Family Medicine Why:  Please keep your hospital follow up visit with Dr. Ola Spurr on 6/24 @ 130PM Contact information: [place] [address] [phone]    Guadalupe Dawn MD PGY-1 Family Medicine Resident

## 2017-09-25 ENCOUNTER — Other Ambulatory Visit: Payer: Self-pay

## 2017-09-25 ENCOUNTER — Ambulatory Visit (INDEPENDENT_AMBULATORY_CARE_PROVIDER_SITE_OTHER): Payer: Self-pay | Admitting: Internal Medicine

## 2017-09-25 ENCOUNTER — Encounter: Payer: Self-pay | Admitting: Internal Medicine

## 2017-09-25 VITALS — BP 92/54 | HR 60 | Temp 97.2°F | Ht 71.0 in | Wt 263.2 lb

## 2017-09-25 DIAGNOSIS — I639 Cerebral infarction, unspecified: Secondary | ICD-10-CM

## 2017-09-25 DIAGNOSIS — I10 Essential (primary) hypertension: Secondary | ICD-10-CM

## 2017-09-25 DIAGNOSIS — N179 Acute kidney failure, unspecified: Secondary | ICD-10-CM

## 2017-09-25 MED ORDER — LISINOPRIL 20 MG PO TABS: 10.0000 mg | ORAL_TABLET | Freq: Every day | ORAL | 3 refills | Status: DC

## 2017-09-25 NOTE — Assessment & Plan Note (Signed)
-   Some improvement in strength noted by patient but still with weakness and increased fatigue. Has limited access to PT/OT. - Has follow-up with Neurology on 7/10 and will defer to their recommendations for duration of dual antiplatelet therapy - Advised patient not to drive until strength improved enough to be able to slam on breaks; will need continued assessment - To bring FMLA paperwork to [facility] for completion - Will send message to SW for advice on other resources for continued therapy

## 2017-09-25 NOTE — Assessment & Plan Note (Signed)
-   Hypotensive at today's visit. Recommended halving lisinopril to dose of 10 mg. Has blood pressure cuff at home and gave parameters of SBP >100 and < 140.  - To follow-up within a month for BP control and will call sooner if having further low or high readings.

## 2017-09-25 NOTE — Progress Notes (Signed)
[facility] Progress Note  Subjective:  Jeremiah Vance is a 47 y.o. 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 He was admitted from 6/16-6/19/19 for right face, arm and leg weakness with MRI showing acute infarct in the left thalamus, posterior basal ganglia, and lateral geniculate. 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 Hospitalization was complicated by AKI, and lisinopril was decreased to 20 mg from 40 mg upon discharge. Since leaving the hospital, he notes some increased sensation in right hand numbness and slurring of speech and increased strength of right leg but somewhat difficult to ascertain because of right knee pain. He does not have insurance and was only approved for 4 PT and OT sessions through [facility] (could not afford recommended CIR or SNF placement). He says he was told there may be ongoing therapy options through a student program at [employer]. He has not been receiving speech therapy but has been using an app on his phone to practice "s" sounds. He denies dizziness or worsening weakness. No chest pain. No vision changes. He was able to afford his [facility] at [facility]. He is taking aspirin, plavix, and statin as prescribed.   Patient is accompanied by his aunt but sister had written questions for him to ask of when can he return to work, when can he drive, and if he could have temporary handicap sticker, as he is needing to use a walker for ambulation. Patient reports he will need FMLA paperwork filled out and plans to apply for short term disability through work.    No Known Allergies  Social History   Tobacco Use  . Smoking status: Never Smoker  . Smokeless tobacco: Never Used  Substance Use Topics  .  Alcohol use: Never    Frequency: Never    Objective: Blood pressure (!) 92/54, pulse 60, temperature (!) 97.2 F (36.2 C), temperature source Oral, height 5\' 11"  (1.803 m), weight 263 lb 3.2 oz (119.4 kg), SpO2 99 %. Body mass index is 36.71 kg/m. Repeat BP 106/48. Constitutional: Very pleasant, obese male in NAD HENT: MMM Cardiovascular: RRR, S1, S2, no m/r/g.  Neurological: AOx3. Speech clear with occasional slowing. Face appears symmetric. Strength 4/5 of right upper extremity compared to 5/5 on left. Strength 5-/5 on right compared to 5/5 on left.  Skin: Skin is warm and dry. Venous stasis changes over anterior tibias.  Psychiatric: Normal mood and affect.  Vitals reviewed  Assessment/Plan: Stroke [facility]) - Some improvement in strength noted by patient but still with weakness and increased fatigue. Has limited access to PT/OT. - Has follow-up with Neurology on 7/10 and will defer to their recommendations for duration of dual antiplatelet therapy - Advised patient not to drive until strength improved enough to be able to slam on breaks; will need continued assessment - To bring FMLA paperwork to [facility] for completion - Will send message to SW for advice on other resources for continued therapy  Hypertension - Hypotensive at today's visit. Recommended halving lisinopril to dose of 10 mg. Has blood pressure cuff at home and gave parameters of SBP >100 and < 140.  - To follow-up within a month for BP control and will call sooner if having further low or high readings.   AKI (acute kidney injury) (  HCC) - Will recheck BMP with resumption of diuretic, ACEi  Follow-up as scheduled with Neurology and with [facility] within the month for BP control.   Dani Gobble, MD [facility], PGY-3

## 2017-09-25 NOTE — Assessment & Plan Note (Signed)
-   Will recheck BMP with resumption of diuretic, ACEi

## 2017-09-25 NOTE — Patient Instructions (Signed)
Mr. Jochum,  Please cut your lisinopril tablet in half for dose of 10 mg daily. Your blood pressure was low at 106/48 for me today. Your goal is between 140 and 100 for the top number. Please take your time with getting up from sitting.  I will ask about speech therapy options and possible other options for physical therapy and speech therapy.  Please bring [place] paperwork needed from work for your hospital stay. Our fax number is [phone].  Best, Dr. Sampson Goon

## 2017-09-26 ENCOUNTER — Telehealth: Payer: Self-pay | Admitting: Internal Medicine

## 2017-09-26 ENCOUNTER — Telehealth: Payer: Self-pay

## 2017-09-26 LAB — BASIC METABOLIC PANEL
BUN / CREAT RATIO: 30 — AB (ref 9–20)
BUN: 58 mg/dL — ABNORMAL HIGH (ref 6–24)
CO2: 25 mmol/L (ref 20–29)
Calcium: 9.3 mg/dL (ref 8.7–10.2)
Chloride: 98 mmol/L (ref 96–106)
Creatinine, Ser: 1.96 mg/dL — ABNORMAL HIGH (ref 0.76–1.27)
GFR, EST AFRICAN AMERICAN: 46 mL/min/{1.73_m2} — AB (ref >59)
GFR, EST NON AFRICAN AMERICAN: 40 mL/min/{1.73_m2} — AB (ref >59)
Glucose: 134 mg/dL — ABNORMAL HIGH (ref 65–99)
POTASSIUM: 4.6 mmol/L (ref 3.5–5.2)
SODIUM: 140 mmol/L (ref 134–144)

## 2017-09-26 NOTE — Telephone Encounter (Signed)
Placed in MDs box to be filled out. Deseree Blount, CMA  

## 2017-09-26 NOTE — Telephone Encounter (Signed)
Disability statement for employee form dropped off for at front desk for completion.  Verified that patient section of form has been completed.  Last DOS/WCC with PCP was 09/25/17.  Placed form in  Red team folder to be completed by clinical staff.  Lina Sar

## 2017-09-26 NOTE — Telephone Encounter (Signed)
Patient left message he is returning call about his lab results. Called patient back and he got the voicemail from PCP but needs the recommended follow up appt. Asked for Tuesday afternoon. Appt made.  Ples Specter, RN [facility] RN)

## 2017-09-29 NOTE — Telephone Encounter (Signed)
Form completed and place in RN basket

## 2017-09-29 NOTE — Telephone Encounter (Signed)
Faxed to [employer] at [phone]. Original placed at front desk for pick up. Copy made for batch scanning. Left message on identified voicemail for patient.  Ples Specter, RN [facility] RN)

## 2017-09-30 ENCOUNTER — Ambulatory Visit (INDEPENDENT_AMBULATORY_CARE_PROVIDER_SITE_OTHER): Payer: Self-pay | Admitting: Family Medicine

## 2017-09-30 ENCOUNTER — Encounter: Payer: Self-pay | Admitting: Family Medicine

## 2017-09-30 ENCOUNTER — Telehealth: Payer: Self-pay | Admitting: Licensed Clinical Social Worker

## 2017-09-30 VITALS — BP 99/60 | HR 59 | Temp 98.5°F | Ht 71.0 in | Wt 263.0 lb

## 2017-09-30 DIAGNOSIS — N179 Acute kidney failure, unspecified: Secondary | ICD-10-CM

## 2017-09-30 DIAGNOSIS — F4321 Adjustment disorder with depressed mood: Secondary | ICD-10-CM | POA: Insufficient documentation

## 2017-09-30 NOTE — Assessment & Plan Note (Addendum)
Acute.  Likely multifactorial with separation from wife, death of dog, and multiple comorbidities.  Patient is not threat to self or others.  PHQ 9 score 7, somewhat difficult. - Will have patient follow-up with behavioral therapy at clinic

## 2017-09-30 NOTE — Patient Instructions (Signed)
Thank you for coming in to see Jeremiah Vance today. Please see below to review our plan for today's visit.  1.  I would like you to stop the lisinopril and Spironolactone along with the Metformin until we get better control of your kidney function. 2.  I would like you to see behavioral therapy here at our clinic to discuss your feelings of depression. 3.  I would like to see you in approximately 2 weeks.  Please call the clinic at [phone] if your symptoms worsen or you have any concerns. It was our pleasure to serve you.  Durward Parcel, DO [facility], PGY-2

## 2017-09-30 NOTE — Progress Notes (Addendum)
Type of Service: Clinical Social Work  Social work consult from Dr. Janalee Dane reference assisting patient with obtaining low cost or free resources for physical therapy in the [place] area.   LCSW called patient to assess the need. Left message to call LCSW with questions.  Informed patient LCSW left information on the [facility] at the front desk with other information patient was picking up.   [facility] Hours 6-8 p.m. every Tuesday Contact Info Phone: [phone] Fax: [phone] E-mail: [email or web address] Address: [address] [place] [address]  Instructed patient to call with questions.  Sammuel Hines, LCSW Licensed Clinical Social Worker [facility]   [phone] 3:03 PM

## 2017-09-30 NOTE — Progress Notes (Signed)
   Subjective   Patient ID: Jeremiah Vance    DOB: 25-Sep-1970, 47 y.o. male   MRN: [id number]  CC: "Follow-up for kidneys"  HPI: Jeremiah Vance is a 47 y.o. male who presents to clinic today for the following:  Acute on chronic kidney disease: Patient is here today for follow-up regarding worsening kidney function.  Kidney impairment has been slowly progressive over the last 3 months since he was diagnosed with HFpEF with a EF of 40-45%.  Patient's discharge weight was 284 pounds.  She was started on lisinopril and Spironolactone along with carvedilol and Demadex.  Patient had a repeat echocardiogram with improved EF of 60-65% as of June.  During this time, patient had experienced an acute ischemic stroke.  Kidney function had worsened and patient was discharged with instructions to discontinue lisinopril if creatinine was elevated at follow-up.  Lisinopril was discontinued due to persistent elevations in creatinine.  Patient was also discontinued on Demadex but is still taking Spironolactone daily.  He has not taken NSAIDs in 3 months.  He knows not to take these medications.  She denies oliguria, hematuria.  Depression: Patient endorsing symptoms of depression.  He believes this is partly due to his wife wanting to leave him along with the death of his dog and multiple medical issues over the last 3 months.  He has no prior history of depression.  Patient denies history of suicidal or homicidal ideation.  He is interested in behavioral therapy.  ROS: see HPI for pertinent.  PMFSH: DM, HFpEF, obesity, h/o CVA, gout, HTN, HLD.  Surgical history hernia.  Family history stroke, DM, heart disease.  Smoking status reviewed. Medications reviewed.  Objective   BP 99/60 (BP Location: Left Arm, Patient Position: Sitting, Cuff Size: Large)   Pulse (!) 59   Temp 98.5 F (36.9 C) (Oral)   Ht 5\' 11"  (1.803 m)   Wt 263 lb (119.3 kg)   SpO2 98%   BMI 36.68 kg/m  Vitals and nursing note  reviewed.  General: well nourished, well developed, NAD with non-toxic appearance HEENT: normocephalic, atraumatic, moist mucous membranes Neck: supple, non-tender without lymphadenopathy Cardiovascular: regular rate and rhythm without murmurs, rubs, or gallops Lungs: clear to auscultation bilaterally with normal work of breathing Abdomen: soft, non-tender, non-distended, normoactive bowel sounds Skin: warm, dry, no rashes or lesions, cap refill < 2 seconds Extremities: warm and well perfused, normal tone, no edema  Assessment & Plan   AKI (acute kidney injury) (HCC) Acute.  Likely related to recent ischemic stroke and underlying HFpEF.  Not taking NSAIDs.  Nephrotoxic agents include spironolactone and metformin at present. - Instructed patient to continue to holding lisinopril, Spironolactone, metformin - Checking BMET - Reviewed return precautions - RTC 2 weeks  Adjustment disorder with depressed mood Acute.  Likely multifactorial with separation from wife, death of dog, and multiple comorbidities.  Patient is not threat to self or others.  PHQ 9 score 7, somewhat difficult. - Will have patient follow-up with behavioral therapy at clinic  Orders Placed This Encounter  Procedures  . Basic metabolic panel   No orders of the defined types were placed in this encounter.   Durward Parcel, DO [facility], PGY-2 09/30/2017, 5:20 PM

## 2017-09-30 NOTE — Assessment & Plan Note (Signed)
Acute.  Likely related to recent ischemic stroke and underlying HFpEF.  Not taking NSAIDs.  Nephrotoxic agents include spironolactone and metformin at present. - Instructed patient to continue to holding lisinopril, Spironolactone, metformin - Checking BMET - Reviewed return precautions - RTC 2 weeks

## 2017-10-01 LAB — BASIC METABOLIC PANEL
BUN / CREAT RATIO: 33 — AB (ref 9–20)
BUN: 56 mg/dL — AB (ref 6–24)
CO2: 28 mmol/L (ref 20–29)
CREATININE: 1.69 mg/dL — AB (ref 0.76–1.27)
Calcium: 9.5 mg/dL (ref 8.7–10.2)
Chloride: 96 mmol/L (ref 96–106)
GFR calc Af Amer: 55 mL/min/{1.73_m2} — ABNORMAL LOW (ref >59)
GFR, EST NON AFRICAN AMERICAN: 47 mL/min/{1.73_m2} — AB (ref >59)
Glucose: 114 mg/dL — ABNORMAL HIGH (ref 65–99)
Potassium: 5.1 mmol/L (ref 3.5–5.2)
SODIUM: 138 mmol/L (ref 134–144)

## 2017-10-06 ENCOUNTER — Telehealth: Payer: Self-pay

## 2017-10-06 NOTE — Telephone Encounter (Signed)
Bennetta Laos, OT called nurse line requesting VO for 1 more visit this week for instructions on adaptive equipment. You can leave VO on her voicemail. Her call back [phone].

## 2017-10-06 NOTE — Telephone Encounter (Signed)
Called and left VM.  Chinonso Linker, DO [facility], PGY-3  

## 2017-10-08 ENCOUNTER — Encounter: Payer: Self-pay | Admitting: Adult Health

## 2017-10-08 ENCOUNTER — Ambulatory Visit: Payer: Self-pay | Admitting: Adult Health

## 2017-10-08 VITALS — BP 142/74 | HR 57 | Ht 71.0 in | Wt 267.2 lb

## 2017-10-08 DIAGNOSIS — E118 Type 2 diabetes mellitus with unspecified complications: Secondary | ICD-10-CM

## 2017-10-08 DIAGNOSIS — E785 Hyperlipidemia, unspecified: Secondary | ICD-10-CM

## 2017-10-08 DIAGNOSIS — I10 Essential (primary) hypertension: Secondary | ICD-10-CM

## 2017-10-08 DIAGNOSIS — I63532 Cerebral infarction due to unspecified occlusion or stenosis of left posterior cerebral artery: Secondary | ICD-10-CM

## 2017-10-08 NOTE — Progress Notes (Signed)
I agree with the above plan 

## 2017-10-08 NOTE — Progress Notes (Addendum)
[facility] [address] [place]. [place] [address] [phone]       OFFICE FOLLOW UP NOTE  Mr. ENOS MUHL Date of Birth:  [birth year] Medical Record Number:  [id number]   Reason for Referral:  hospital stroke follow up  CHIEF COMPLAINT:  Chief Complaint  Patient presents with  . New Patient (Initial Visit)    hospital follow up. Pt here with his sister. Patient reports still having weakness on his right side.   . Other    Pt was advised to hold his lisinopril, spironolactone, and metformin until next week.     HPI: Derrel D Carrigan is being seen today for initial visit in the office for punctate left PLIC infarct on 04/09/73. History obtained from patient and chart review. Reviewed all radiology images and labs personally.  Mr. NEVILLE WALSTON is a 47 y.o. male with history of diabetes, hypertension, morbid obesity who presented on 09/08/17 and was diagnosed with small left internal capsule infarct but returned on 09/15/17 with right hemiparesis and stuttering speech.  CT head was negative for acute stroke.  MRI reviewed and showed punctate left PLIC infarct along with old bilateral BG and thalamic infarcts with advanced white matter disease.  CTA head and neck showed suboptimal CTA due to delayed timing significant venous contrast but no significant carotid or vertebral artery stenosis was seen.  2D echo showed an EF of 60 to 65% without cardiac source of embolus.  LDL 128 and as patient was not on statin PTA is recommended to start Lipitor 40 mg daily.  A1c 6.9.  No antithrombotic PTA and recommended DAPT for 3 weeks on aspirin alone.  Therapy recommended outpatient PT only and patient was discharged in stable condition.  Patient was seen today for hospital follow-up and is accompanied by his wife.  He states he continues to have right hemiparesis along with dysarthria but this has been improving.  He was able to participate in a few free therapy sessions due to  financial reasons but currently does exercises at home.  He does continue to use a rolling walker while he is out but does not use this while in home.  He has not returned to work where he works as a Freight forwarder at [employer].  His PCP did fill out FMLA paperwork for this.  He continues to take both aspirin and Plavix without side effects of bleeding or bruising.  Continues to take Lipitor without side effects myalgias.  Blood pressure today 142/74.  Recently patient has been experiencing hypotension and his PCP has been managing medication management were recently lisinopril, metformin and spironolactone have all been discontinued.  Patient does have another follow-up appointment PCP on 10/14/2017.  Denies new or worsening stroke/TIA symptoms.   ROS:   14 system review of systems performed and negative with exception of weakness, speech difficulty and walking difficulty  PMH:  Past Medical History:  Diagnosis Date  . CKD (chronic kidney disease) stage 3, GFR 30-59 ml/min (HCC)   . History of cardiomyopathy    LVEF 40 to 45% in April 2019 - subsequently normalized  . Hypertension   . Ischemic stroke ([place])    Small left internal capsule infarct due to lacunar disease  . Morbid obesity ([place])   . Type 2 diabetes mellitus (HCC)     PSH:  Past Surgical History:  Procedure Laterality Date  . HERNIA REPAIR      Social History:  Social History   Socioeconomic History  .  Marital status: Married    Spouse name: Not on file  . Number of children: Not on file  . Years of education: Not on file  . Highest education level: Not on file  Occupational History  . Not on file  Social Needs  . Financial resource strain: Not on file  . Food insecurity:    Worry: Not on file    Inability: Not on file  . Transportation needs:    Medical: Not on file    Non-medical: Not on file  Tobacco Use  . Smoking status: Never Smoker  . Smokeless tobacco: Never Used  Substance and Sexual Activity  . Alcohol  use: Never    Frequency: Never  . Drug use: Not on file  . Sexual activity: Not on file  Lifestyle  . Physical activity:    Days per week: Not on file    Minutes per session: Not on file  . Stress: Not on file  Relationships  . Social connections:    Talks on phone: Not on file    Gets together: Not on file    Attends religious service: Not on file    Active member of club or organization: Not on file    Attends meetings of clubs or organizations: Not on file    Relationship status: Not on file  . Intimate partner violence:    Fear of current or ex partner: Not on file    Emotionally abused: Not on file    Physically abused: Not on file    Forced sexual activity: Not on file  Other Topics Concern  . Not on file  Social History Narrative  . Not on file    Family History:  Family History  Problem Relation Age of Onset  . Diabetes Father   . Heart disease Father   . Diabetes Sister   . Diabetes Mother   . Stroke Mother   . Diabetes Maternal Grandmother   . Heart disease Maternal Grandmother   . Stroke Paternal Grandfather   . Heart disease Paternal Grandfather     Medications:   Current Outpatient Medications on File Prior to Visit  Medication Sig Dispense Refill  . acetaminophen (TYLENOL) 500 MG tablet Take 500 mg by mouth every 6 (six) hours as needed for mild pain or moderate pain.    [name] Kitchen aspirin 81 MG chewable tablet Chew 1 tablet (81 mg total) by mouth daily. 30 tablet 0  . atorvastatin (LIPITOR) 40 MG tablet Take 1 tablet (40 mg total) by mouth daily at 6 PM. 30 tablet 0  . blood glucose meter kit and supplies KIT Dispense based on patient and insurance preference. Use up to four times daily as directed. (FOR ICD-9 250.00, 250.01). 1 each 0  . carvedilol (COREG) 6.25 MG tablet Take 1 tablet (6.25 mg total) by mouth 2 (two) times daily. 60 tablet 3  . torsemide (DEMADEX) 20 MG tablet Take 2 tablets (40 mg total) by mouth daily. 60 tablet 4  . lisinopril  (PRINIVIL,ZESTRIL) 20 MG tablet Take 0.5 tablets (10 mg total) by mouth daily. (Patient not taking: Reported on 10/08/2017) 30 tablet 3  . metFORMIN (GLUCOPHAGE) 500 MG tablet Take 1 tablet (500 mg total) by mouth 2 (two) times daily with a meal. (Patient not taking: Reported on 10/08/2017) 60 tablet 2  . spironolactone (ALDACTONE) 25 MG tablet Take 1 tablet (25 mg total) by mouth daily. (Patient not taking: Reported on 10/08/2017) 30 tablet 3   No current facility-administered  medications on file prior to visit.     Allergies:  No Known Allergies   Physical Exam  Vitals:   10/08/17 0815  BP: (!) 142/74  Pulse: (!) 57  Weight: 267 lb 3.2 oz (121.2 kg)  Height: '5\' 11"'  (1.803 m)   Body mass index is 37.27 kg/m. No exam data present  General: Obese pleasant middle-aged male, seated, in no evident distress Head: head normocephalic and atraumatic.   Neck: supple with no carotid or supraclavicular bruits Cardiovascular: regular rate and rhythm, no murmurs Musculoskeletal: no deformity Skin:  no rash/petichiae Vascular:  Normal pulses all extremities  Neurologic Exam Mental Status: Awake and fully alert.  Mild dysarthria noted.  Oriented to place and time. Recent and remote memory intact. Attention span, concentration and fund of knowledge appropriate. Mood and affect appropriate.  Cranial Nerves: Fundoscopic exam reveals sharp disc margins. Pupils equal, briskly reactive to light. Extraocular movements full without nystagmus. Visual fields full to confrontation. Hearing intact. Facial sensation intact. Face, tongue, palate moves normally and symmetrically.  Motor: Normal bulk and tone.  Right-sided upper extremity with giveaway weakness and poor cooperation due to shoulder pain.  Right lower extremity with limited exam due to poor cooperation due to chronic knee pain. Sensory.: intact to touch , pinprick , position and vibratory sensation.  Coordination: Rapid alternating movements normal  in all extremities. Finger-to-nose and heel-to-shin performed accurately bilaterally.  Orbiting of upper extremities not observed.  Equal dexterity in bilateral upper extremities.  Gait and Station: Arises from chair without difficulty. Stance is normal. Gait demonstrates normal stride length and balance but does take slow steady steps with assistance of rolling walker.  Reflexes: 1+ and symmetric. Toes downgoing.    NIHSS  1 Modified Rankin  2   Diagnostic Data (Labs, Imaging, Testing)  CT HEAD WO CONTRAST 09/14/17 IMPRESSION: 1. No acute intracranial abnormality 2. ASPECTS is 10 3. These results were called by telephone at the time of interpretation on 09/14/2017 at 9:06 am to Dr. Rory Percy , who verbally acknowledged these results.  MR BRAIN WO CONTRAST 09/14/17 IMPRESSION: Acute infarct in the left thalamus, posterior basal ganglia, and lateral geniculate Moderate chronic microvascular ischemia. Chronic areas of chronic Microhemorrhage.  CT ANGIO NECK/HEAD W OR WO CONTRAST 09/08/17 IMPRESSION: Suboptimal CTA due to delayed timing and significant venous contrast. No significant carotid or vertebral artery stenosis in the neck. No significant intracranial stenosis. Negative for large vessel occlusion. Small lung nodules on the right. CT chest recommended for further evaluation.  ECHOCARDIOGRAM 09/07/17 Study Conclusions - Left ventricle: The cavity size was moderately dilated. Wall   thickness was increased in a pattern of mild LVH. Systolic   function was normal. The estimated ejection fraction was in the   range of 60% to 65%. - Mitral valve: Calcified annulus. Mildly thickened leaflets .   Valve area by pressure half-time: 1.4 cm^2.      ASSESSMENT: Crue D Zea is a 47 y.o. year old male here with left PLIC infarct on 3/70/48 secondary to small vessel disease. Vascular risk factors include HTN, HLD and DM.   PLAN: -Continue aspirin 81 mg daily and clopidogrel 75  mg daily  and Lipitor for secondary stroke prevention -Advised patient to stop Plavix after completion of current prescription (approximately 7 additional days) and continue aspirin 81 mg only -F/u with PCP regarding your HLD, HTN and DM management -continue to monitor BP at home -Encouraged to continue home exercises daily -Advised patient that agree with PCP -  he should not be driving at this time due to right hemiparesis along with returning to work due to safety reasons.  PCP to continue to follow for FMLA paperwork and return to work -Maintain strict control of hypertension with blood pressure goal below 130/90, diabetes with hemoglobin A1c goal below 6.5% and cholesterol with LDL cholesterol (bad cholesterol) goal below 70 mg/dL. I also advised the patient to eat a healthy diet with plenty of whole grains, cereals, fruits and vegetables, exercise regularly and maintain ideal body weight.  Follow up in 3 months or call earlier if needed   Greater than 50% of time during this 25 minute visit was spent on counseling,explanation of diagnosis of left PLIC infarct, reviewing risk factor management of HTN, HLD and DM, planning of further management, discussion with patient and family and coordination of care   Venancio Poisson, [facility]  [facility] [address] [place], [place] [address]  Phone [phone] Fax [phone]

## 2017-10-08 NOTE — Patient Instructions (Addendum)
Continue aspirin 81 mg daily  and lipitor  for secondary stroke prevention  Stop plavix after you have completed current prescription   Continue to follow up with PCP regarding cholesterol, diabetes and blood pressure management   Continue to monitor blood pressure at home  Continue doing home exercises   Maintain strict control of hypertension with blood pressure goal below 130/90, diabetes with hemoglobin A1c goal below 6.5% and cholesterol with LDL cholesterol (bad cholesterol) goal below 70 mg/dL. I also advised the patient to eat a healthy diet with plenty of whole grains, cereals, fruits and vegetables, exercise regularly and maintain ideal body weight.  Followup in the future with me in 3 months or call earlier if needed        Thank you for coming to see [place] at [facility]. I hope we have been able to provide you high quality care today.  You may receive a patient satisfaction survey over the next few weeks. We would appreciate your feedback and comments so that we may continue to improve ourselves and the health of our patients.

## 2017-10-09 ENCOUNTER — Telehealth: Payer: Self-pay

## 2017-10-09 NOTE — Telephone Encounter (Signed)
Noted. Thanks.  Luria Rosario, DO [facility], PGY-3  

## 2017-10-09 NOTE — Telephone Encounter (Signed)
Jeremiah Vance, OT with [facility], did OT eval in home. Patient declined any further visits. Stated he does not want any adaptive equipment because he is "going to get better and not need it."  FYI  Ples Specter, RN [facility] RN)

## 2017-10-14 ENCOUNTER — Ambulatory Visit (INDEPENDENT_AMBULATORY_CARE_PROVIDER_SITE_OTHER): Payer: Self-pay | Admitting: Family Medicine

## 2017-10-14 ENCOUNTER — Other Ambulatory Visit: Payer: Self-pay

## 2017-10-14 ENCOUNTER — Encounter: Payer: Self-pay | Admitting: Psychology

## 2017-10-14 ENCOUNTER — Encounter: Payer: Self-pay | Admitting: Family Medicine

## 2017-10-14 VITALS — BP 110/70 | HR 63 | Temp 98.6°F | Ht 71.0 in | Wt 270.0 lb

## 2017-10-14 DIAGNOSIS — F32A Depression, unspecified: Secondary | ICD-10-CM

## 2017-10-14 DIAGNOSIS — E785 Hyperlipidemia, unspecified: Secondary | ICD-10-CM

## 2017-10-14 DIAGNOSIS — F4321 Adjustment disorder with depressed mood: Secondary | ICD-10-CM

## 2017-10-14 DIAGNOSIS — F329 Major depressive disorder, single episode, unspecified: Secondary | ICD-10-CM

## 2017-10-14 DIAGNOSIS — I639 Cerebral infarction, unspecified: Secondary | ICD-10-CM

## 2017-10-14 DIAGNOSIS — N179 Acute kidney failure, unspecified: Secondary | ICD-10-CM

## 2017-10-14 MED ORDER — ATORVASTATIN CALCIUM 40 MG PO TABS: 40.0000 mg | ORAL_TABLET | Freq: Every day | ORAL | 3 refills | Status: DC

## 2017-10-14 NOTE — Assessment & Plan Note (Signed)
Patient displayed a normal range of affect despite several acute stressors.  PHQ-9 is more elevated than last week.  No evidence of SI.  He sounds like he is moving regularly - he is proud that he has increased from one to two laps around his sister's apartment complex.  Main goal of handoff today was to gauge his interest in therapy and provide options.  He reported he is quite interested in getting therapy.  His sister said she or his other sister could provide transportation.  Provided info about integrated care vs. Community referral.  He opted to stay in house.  Scheduled for 7/30 at 9:30.  Also would like to see him next week if sister can bring him.  She will call to schedule if she can.

## 2017-10-14 NOTE — Assessment & Plan Note (Signed)
Chronic.  On high intensity statin along with aspirin and clopidogrel. - Given refill for atorvastatin 40 mg daily  - Continue aspirin daily - Discontinue clopidogrel after completing 3 more days as instructed by neurology

## 2017-10-14 NOTE — Assessment & Plan Note (Signed)
Chronic.  Likely multifactorial due to multiple medical issues, wife leaving him, death of dog, inability to work, and having to move in with his sister.  Patients PHQ 9 score 10 which may be underscored.  Patient does not appear to be threat to self or others. - Warm handoff provided to Dr. Pascal Lux with plan to follow-up with behavioral therapy at clinic - Would consider pharmacotherapy in the future if patient is interested

## 2017-10-14 NOTE — Patient Instructions (Signed)
Thank you for coming in to see [place] today. Please see below to review our plan for today's visit.  1.  I refilled your Lipitor.  Continue taking this daily along with your aspirin and follow-up with the neurologist.  Please do not drive or work until you follow-up with a neurologist and receive clearance. 2.  I am pleased that you are motivated to take on his feelings of depression.  This is very common and expected with somebody going through multiple medical problems and other issues.  We will help you work through this difficult time.  If any point she would like to see me more like to start medication, please let me know. 3.  I will call you with your results and inform you of what to do with your medications.  Follow-up with me in 1 month.  Please call the clinic at [phone] if your symptoms worsen or you have any concerns. It was our pleasure to serve you.  Durward Parcel, DO [facility], PGY-3

## 2017-10-14 NOTE — Progress Notes (Signed)
Dr. Abelardo Diesel requested a Behavioral Health Consult.   Presenting Issue:  Patient had a stroke in June.  He is dealing with that, not working and loss of income subsequent to that, and also a change in residence (living with this sister).  He is also getting separated from his wife and several years of marital stress (he would opt to try to stay together) and his dog died recently.    Report of symptoms:  Not socializing as much.  Feeling sad about his marriage.  His self report measures indicate several days of down mood and anhedonia.  More than half the days of difficulty with sleep and energy.    Duration of CURRENT symptoms:  Recent.  Impact on function:  Hard to say because his stroke and the residual effects have affected his function so much.  His sister [name]) thinks that his mood / lack of motivation may be getting in the way of him returning to his prior baseline function.  Psychiatric History - Diagnoses: Per Dr. Abelardo Diesel - no prior depression or anxiety.    Family history of psychiatric issues:  Did not assess.  Current and history of substance use:  Did not assess.  Medical conditions that might explain or contribute to symptoms:  Stroke can make depression more likely.    PHQ-9:  10 - up from 7 last visit.     Warmhandoff:    Warm Hand Off Completed.

## 2017-10-14 NOTE — Assessment & Plan Note (Addendum)
Acute.  Continues to have diminished kidney function though appears to be some improvement.  Without oliguria. - Checking BMET - Will restart metformin, losartan, and Spironolactone as appropriate based on results - RTC 1 month

## 2017-10-14 NOTE — Progress Notes (Signed)
Subjective   Patient ID: Jeremiah Vance    DOB: Aug 20, 1970, 47 y.o. male   MRN: [id number]  CC: "Depression"  HPI: Jeremiah Vance is a 47 y.o. male who presents to clinic today for the following:  Depression: Patient seen by me 1 month ago for follow-up after hospitalization for stroke and AKI.  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  He also endorses his wife leaving him in the death of his dog.  He endorses symptoms of depression with no prior history of depression.  He denies any suicidal or homicidal ideation at present.  He is interested in receiving behavioral therapy and is considering pharmacotherapy in the future if needed.  AKI: Patient here today to follow-up for kidney function evaluation.  Kidney injury was thought to be related to prerenal incident with CVA and signs of acute heart failure requiring hospitalization back in April.  He has no prior history of CKD.  He does have known uncontrolled diabetes, hypertension, and hyperlipidemia.  Patient has been holding his metformin, spironolactone, and losartan as instructed.  Continues to produce urine without hematuria, dysuria.  Hyperlipidemia: Patient followed by neurology after recent CVA.  He is taking high intensity statin, aspirin, along with clopidogrel which he is expected to discontinuing 3 days.  He would like a refill of his statin today and was instructed to continue his aspirin after completing the clopidogrel.  ROS: see HPI for pertinent.  PMFSH: DM, HFpEF, obesity, h/o CVA, gout, HTN, HLD.  Surgical history hernia.  Family history stroke, DM, heart disease.  Smoking status reviewed. Medications reviewed.  Objective   BP 110/70   Pulse 63   Temp 98.6 F (37 C) (Oral)   Ht 5\' 11"  (1.803 m)   Wt 270 lb (122.5 kg)   SpO2 98%   BMI 37.66 kg/m  Vitals and nursing note reviewed.  General: well nourished, well developed, NAD with non-toxic  appearance HEENT: normocephalic, atraumatic, moist mucous membranes Neck: supple, non-tender without lymphadenopathy Cardiovascular: regular rate and rhythm without murmurs, rubs, or gallops Lungs: clear to auscultation bilaterally with normal work of breathing Abdomen: soft, non-tender, non-distended, normoactive bowel sounds Skin: warm, dry, cap refill < 2 seconds Extremities: warm and well perfused, normal tone, no edema, small superficial abrasion on left shin without erythema or discharge Neuro: CNII-XII intact with exception to 3/5 right upper extremity and grip strength along with right lower extremity, fine motor intact and patient is without facial droop or dysarthria, sensation intact throughout Psych: euthymic mood, congruent affect  Assessment & Plan   AKI (acute kidney injury) (HCC) Acute.  Continues to have diminished kidney function though appears to be some improvement.  Without oliguria. - Checking BMET - Will restart metformin, losartan, and Spironolactone as appropriate based on results - RTC 1 month  Hyperlipidemia Chronic.  On high intensity statin along with aspirin and clopidogrel. - Given refill for atorvastatin 40 mg daily  - Continue aspirin daily - Discontinue clopidogrel after completing 3 more days as instructed by neurology  Depression Chronic.  Likely multifactorial due to multiple medical issues, wife leaving him, death of dog, inability to work, and having to move in with his sister.  Patients PHQ 9 score 10 which may be underscored.  Patient does not appear to be threat to self or others. - Warm handoff provided to Dr. Pascal Lux with plan to follow-up with behavioral therapy at clinic - Would consider pharmacotherapy  in the future if patient is interested  Orders Placed This Encounter  Procedures  . Basic metabolic panel   Meds ordered this encounter  Medications  . atorvastatin (LIPITOR) 40 MG tablet    Sig: Take 1 tablet (40 mg total) by mouth daily  at 6 PM.    Dispense:  90 tablet    Refill:  3    Durward Parcel, DO [facility], PGY-3 10/14/2017, 12:20 PM

## 2017-10-15 LAB — BASIC METABOLIC PANEL
BUN/Creatinine Ratio: 21 — ABNORMAL HIGH (ref 9–20)
BUN: 26 mg/dL — ABNORMAL HIGH (ref 6–24)
CALCIUM: 9.2 mg/dL (ref 8.7–10.2)
CHLORIDE: 97 mmol/L (ref 96–106)
CO2: 26 mmol/L (ref 20–29)
Creatinine, Ser: 1.21 mg/dL (ref 0.76–1.27)
GFR calc non Af Amer: 71 mL/min/{1.73_m2} (ref >59)
GFR, EST AFRICAN AMERICAN: 82 mL/min/{1.73_m2} (ref >59)
Glucose: 123 mg/dL — ABNORMAL HIGH (ref 65–99)
Potassium: 4.6 mmol/L (ref 3.5–5.2)
Sodium: 140 mmol/L (ref 134–144)

## 2017-10-21 ENCOUNTER — Encounter (HOSPITAL_COMMUNITY): Payer: Self-pay | Admitting: Emergency Medicine

## 2017-10-21 ENCOUNTER — Other Ambulatory Visit: Payer: Self-pay

## 2017-10-21 ENCOUNTER — Emergency Department (HOSPITAL_COMMUNITY): Payer: Self-pay

## 2017-10-21 ENCOUNTER — Ambulatory Visit: Payer: Self-pay

## 2017-10-21 ENCOUNTER — Ambulatory Visit (HOSPITAL_COMMUNITY)
Admission: EM | Admit: 2017-10-21 | Discharge: 2017-10-21 | Disposition: A | Discharge status: Home / Self Care | Payer: Self-pay | Attending: Family Medicine | Admitting: Family Medicine

## 2017-10-21 ENCOUNTER — Encounter (HOSPITAL_COMMUNITY): Payer: Self-pay

## 2017-10-21 ENCOUNTER — Inpatient Hospital Stay (HOSPITAL_COMMUNITY)
Admission: EM | Admit: 2017-10-21 | Discharge: 2017-10-28 | DRG: 354 | Disposition: A | Discharge status: Home / Self Care | Payer: Self-pay | Attending: Surgery | Admitting: Surgery

## 2017-10-21 DIAGNOSIS — K430 Incisional hernia with obstruction, without gangrene: Principal | ICD-10-CM | POA: Diagnosis present

## 2017-10-21 DIAGNOSIS — E7800 Pure hypercholesterolemia, unspecified: Secondary | ICD-10-CM | POA: Diagnosis present

## 2017-10-21 DIAGNOSIS — R1084 Generalized abdominal pain: Secondary | ICD-10-CM

## 2017-10-21 DIAGNOSIS — I130 Hypertensive heart and chronic kidney disease with heart failure and stage 1 through stage 4 chronic kidney disease, or unspecified chronic kidney disease: Secondary | ICD-10-CM | POA: Diagnosis present

## 2017-10-21 DIAGNOSIS — R7989 Other specified abnormal findings of blood chemistry: Secondary | ICD-10-CM

## 2017-10-21 DIAGNOSIS — R112 Nausea with vomiting, unspecified: Secondary | ICD-10-CM

## 2017-10-21 DIAGNOSIS — Z79899 Other long term (current) drug therapy: Secondary | ICD-10-CM

## 2017-10-21 DIAGNOSIS — R197 Diarrhea, unspecified: Secondary | ICD-10-CM

## 2017-10-21 DIAGNOSIS — I5033 Acute on chronic diastolic (congestive) heart failure: Secondary | ICD-10-CM

## 2017-10-21 DIAGNOSIS — Z7982 Long term (current) use of aspirin: Secondary | ICD-10-CM

## 2017-10-21 DIAGNOSIS — K56609 Unspecified intestinal obstruction, unspecified as to partial versus complete obstruction: Secondary | ICD-10-CM

## 2017-10-21 DIAGNOSIS — Z8719 Personal history of other diseases of the digestive system: Secondary | ICD-10-CM

## 2017-10-21 DIAGNOSIS — Z7984 Long term (current) use of oral hypoglycemic drugs: Secondary | ICD-10-CM

## 2017-10-21 DIAGNOSIS — Z823 Family history of stroke: Secondary | ICD-10-CM

## 2017-10-21 DIAGNOSIS — Z6837 Body mass index (BMI) 37.0-37.9, adult: Secondary | ICD-10-CM

## 2017-10-21 DIAGNOSIS — N179 Acute kidney failure, unspecified: Secondary | ICD-10-CM | POA: Diagnosis present

## 2017-10-21 DIAGNOSIS — I5022 Chronic systolic (congestive) heart failure: Secondary | ICD-10-CM | POA: Diagnosis present

## 2017-10-21 DIAGNOSIS — E1122 Type 2 diabetes mellitus with diabetic chronic kidney disease: Secondary | ICD-10-CM | POA: Diagnosis present

## 2017-10-21 DIAGNOSIS — N183 Chronic kidney disease, stage 3 (moderate): Secondary | ICD-10-CM | POA: Diagnosis present

## 2017-10-21 DIAGNOSIS — Z833 Family history of diabetes mellitus: Secondary | ICD-10-CM

## 2017-10-21 DIAGNOSIS — I509 Heart failure, unspecified: Secondary | ICD-10-CM

## 2017-10-21 DIAGNOSIS — Z8249 Family history of ischemic heart disease and other diseases of the circulatory system: Secondary | ICD-10-CM

## 2017-10-21 DIAGNOSIS — E785 Hyperlipidemia, unspecified: Secondary | ICD-10-CM | POA: Diagnosis present

## 2017-10-21 DIAGNOSIS — Z8673 Personal history of transient ischemic attack (TIA), and cerebral infarction without residual deficits: Secondary | ICD-10-CM

## 2017-10-21 HISTORY — DX: Pure hypercholesterolemia, unspecified: E78.00

## 2017-10-21 HISTORY — DX: Incisional hernia with obstruction, without gangrene: K43.0

## 2017-10-21 HISTORY — DX: Cerebral infarction, unspecified: I63.9

## 2017-10-21 HISTORY — DX: Heart failure, unspecified: I50.9

## 2017-10-21 LAB — COMPREHENSIVE METABOLIC PANEL
ALT: 30 U/L (ref 0–44)
ANION GAP: 12 (ref 5–15)
AST: 28 U/L (ref 15–41)
Albumin: 3.5 g/dL (ref 3.5–5.0)
Alkaline Phosphatase: 138 U/L — ABNORMAL HIGH (ref 38–126)
BUN: 39 mg/dL — ABNORMAL HIGH (ref 6–20)
CHLORIDE: 100 mmol/L (ref 98–111)
CO2: 27 mmol/L (ref 22–32)
CREATININE: 1.84 mg/dL — AB (ref 0.61–1.24)
Calcium: 9.2 mg/dL (ref 8.9–10.3)
GFR, EST AFRICAN AMERICAN: 49 mL/min — AB (ref >60)
GFR, EST NON AFRICAN AMERICAN: 42 mL/min — AB (ref >60)
Glucose, Bld: 154 mg/dL — ABNORMAL HIGH (ref 70–99)
POTASSIUM: 4.1 mmol/L (ref 3.5–5.1)
SODIUM: 139 mmol/L (ref 135–145)
Total Bilirubin: 2.1 mg/dL — ABNORMAL HIGH (ref 0.3–1.2)
Total Protein: 7.4 g/dL (ref 6.5–8.1)

## 2017-10-21 LAB — POCT I-STAT, CHEM 8
BUN: 38 mg/dL — ABNORMAL HIGH (ref 6–20)
CREATININE: 1.8 mg/dL — AB (ref 0.61–1.24)
Calcium, Ion: 1.06 mmol/L — ABNORMAL LOW (ref 1.15–1.40)
Chloride: 100 mmol/L (ref 98–111)
GLUCOSE: 160 mg/dL — AB (ref 70–99)
HCT: 40 % (ref 39.0–52.0)
HEMOGLOBIN: 13.6 g/dL (ref 13.0–17.0)
POTASSIUM: 4.6 mmol/L (ref 3.5–5.1)
Sodium: 139 mmol/L (ref 135–145)
TCO2: 26 mmol/L (ref 22–32)

## 2017-10-21 LAB — URINALYSIS, ROUTINE W REFLEX MICROSCOPIC
Bilirubin Urine: NEGATIVE
GLUCOSE, UA: NEGATIVE mg/dL
Hgb urine dipstick: NEGATIVE
KETONES UR: 5 mg/dL — AB
LEUKOCYTES UA: NEGATIVE
Nitrite: NEGATIVE
PH: 8 (ref 5.0–8.0)
PROTEIN: 30 mg/dL — AB
Specific Gravity, Urine: 1.018 (ref 1.005–1.030)

## 2017-10-21 LAB — POCT URINALYSIS DIP (DEVICE)
GLUCOSE, UA: NEGATIVE mg/dL
Hgb urine dipstick: NEGATIVE
KETONES UR: 15 mg/dL — AB
Leukocytes, UA: NEGATIVE
NITRITE: NEGATIVE
PH: 8.5 — AB (ref 5.0–8.0)
PROTEIN: 100 mg/dL — AB
Specific Gravity, Urine: 1.015 (ref 1.005–1.030)
UROBILINOGEN UA: 2 mg/dL — AB (ref 0.0–1.0)

## 2017-10-21 LAB — CBC
HCT: 40.3 % (ref 39.0–52.0)
HEMOGLOBIN: 13.1 g/dL (ref 13.0–17.0)
MCH: 28.9 pg (ref 26.0–34.0)
MCHC: 32.5 g/dL (ref 30.0–36.0)
MCV: 89 fL (ref 78.0–100.0)
PLATELETS: 334 10*3/uL (ref 150–400)
RBC: 4.53 MIL/uL (ref 4.22–5.81)
RDW: 13.6 % (ref 11.5–15.5)
WBC: 12.4 10*3/uL — AB (ref 4.0–10.5)

## 2017-10-21 LAB — LIPASE, BLOOD: LIPASE: 52 U/L — AB (ref 11–51)

## 2017-10-21 MED ORDER — SODIUM CHLORIDE 0.9 % IV BOLUS
1000.0000 mL | Freq: Once | INTRAVENOUS | Status: AC
  Administered 2017-10-21: 1000 mL via INTRAVENOUS

## 2017-10-21 MED ORDER — ONDANSETRON HCL 4 MG/2ML IJ SOLN: 4.0000 mg | Freq: Four times a day (QID) | INTRAMUSCULAR | Status: DC | PRN

## 2017-10-21 MED ORDER — ONDANSETRON HCL 4 MG/2ML IJ SOLN
4.0000 mg | Freq: Once | INTRAMUSCULAR | Status: AC
  Administered 2017-10-21: 4 mg via INTRAVENOUS
  Filled 2017-10-21: qty 2

## 2017-10-21 MED ORDER — MORPHINE SULFATE (PF) 4 MG/ML IV SOLN
4.0000 mg | Freq: Once | INTRAVENOUS | Status: AC
  Administered 2017-10-21: 4 mg via INTRAVENOUS
  Filled 2017-10-21: qty 1

## 2017-10-21 MED ORDER — MORPHINE SULFATE (PF) 4 MG/ML IV SOLN
4.0000 mg | Freq: Once | INTRAVENOUS | Status: AC
Start: 2017-10-21 — End: 2017-10-21
  Administered 2017-10-21: 4 mg via INTRAVENOUS
  Filled 2017-10-21: qty 1

## 2017-10-21 MED ORDER — ONDANSETRON 4 MG PO TBDP: 4.0000 mg | ORAL_TABLET | Freq: Four times a day (QID) | ORAL | Status: DC | PRN

## 2017-10-21 MED ORDER — IOPAMIDOL (ISOVUE-300) INJECTION 61%
INTRAVENOUS | Status: AC
  Filled 2017-10-21: qty 30

## 2017-10-21 MED ORDER — HYDROMORPHONE HCL 1 MG/ML IJ SOLN
1.0000 mg | INTRAMUSCULAR | Status: DC | PRN
  Administered 2017-10-22 – 2017-10-27 (×14): 1 mg via INTRAVENOUS
  Filled 2017-10-21 (×14): qty 1

## 2017-10-21 MED ORDER — INSULIN ASPART 100 UNIT/ML ~~LOC~~ SOLN
0.0000 [IU] | Freq: Three times a day (TID) | SUBCUTANEOUS | Status: DC
  Administered 2017-10-22 – 2017-10-27 (×4): 4 [IU] via SUBCUTANEOUS
  Administered 2017-10-27: 3 [IU] via SUBCUTANEOUS

## 2017-10-21 MED ORDER — DEXTROSE-NACL 5-0.9 % IV SOLN
INTRAVENOUS | Status: DC
  Administered 2017-10-22: 01:00:00 via INTRAVENOUS

## 2017-10-21 MED ORDER — METOCLOPRAMIDE HCL 5 MG/ML IJ SOLN
10.0000 mg | Freq: Once | INTRAMUSCULAR | Status: AC
  Administered 2017-10-21: 10 mg via INTRAVENOUS
  Filled 2017-10-21: qty 2

## 2017-10-21 NOTE — ED Triage Notes (Signed)
Sister stated, he was sent here by UC for more test a evaluation of dehydration. Since last night he has had N/V/D last night. Has not had anything to eat today.

## 2017-10-21 NOTE — ED Provider Notes (Signed)
47 year old male received signout from [facility] [place] pending imaging results. Per his HPI:  "47 year old male with history of non-insulin-dependent diabetes, prior stroke, CHF, hypertension sent here from urgent care for further evaluation and management of his nausea vomiting diarrhea.  Since yesterday patient has had 4-5 episodes of nonbloody nonbilious vomitus as well as 4-5 episodes of nonbloody non-mucousy diarrhea.  Endorses no abdominal pain but has minimal in intensity. Report mild shortness of breath.  No report of fever, chills, chest pain, leg swelling, dyspnea on exertion.  He did report symptoms started last night at 12 PM but he was eating at [employer] earlier for dinner.  No prior abdominal surgeries.  Patient did mention being hospitalized in June for strokes.  He was found to be dehydrated likely secondary to several of his medication that was temporarily discontinued.  His PCP recently restarted him on his medication several days prior."  Physical Exam  BP 108/64 (BP Location: Left Arm)   Pulse 75   Temp 98.7 F (37.1 C) (Oral)   Resp 19   Ht 5\' 11"  (1.803 m)   Wt 120.6 kg (265 lb 14 oz)   SpO2 100%   BMI 37.08 kg/m   Physical Exam  The patient has active bowel sounds in all 4 quadrants.  Abdomen is soft, nondistended.  There is a large mass in the left lower quadrant that is consistent with a large abdominal wall hernia.  It is unable to be reduced.  No overlying erythema.  He is significantly tender to palpation throughout the abdomen, but more focally over the area of the hernia.  ED Course/Procedures     Procedures  MDM    47 year old male with history of non-insulin-dependent diabetes, prior stroke, CHF, hypertension sent here from urgent care for further evaluation and management of his nausea vomiting diarrhea.  The patient was received at signout from [facility] pending CT abdomen pelvis.  The patient required multiple doses of IV morphine for pain control.  After  returning from CT, the patient had 2-3 episodes of nonbloody, nonbilious emesis, which resolved with Reglan.  CT demonstrating a small bowel obstruction due to a dilated and fluid-filled midline hernia.  No bowel perforation.  I attempted to reduce the hernia at bedside without success. Consult to general surgery and spoke with Dr. Derrell Lolling who evaluated the patient and was unable to reduce the hernia at bedside.  NG tube was placed and confirmed with post placement x-ray.  Dr. Derrell Lolling recommended rechecking the patient after he finished a procedure in the OR to see if he was able to reduce the hernia at bedside.    On reevaluation, he was unable to reduce the hernia and recommended admission. Surgery will admit. The patient appears reasonably stabilized for admission considering the current resources, flow, and capabilities available in the ED at this time, and I doubt any other [facility] requiring further screening and/or treatment in the ED prior to admission.        Frederik Pear A, PA-C 10/22/17 0016    Derwood Kaplan, MD 10/22/17 [phone]

## 2017-10-21 NOTE — ED Notes (Signed)
Surgeon at bedside.  

## 2017-10-21 NOTE — ED Notes (Signed)
ED Provider at bedside. 

## 2017-10-21 NOTE — ED Triage Notes (Signed)
Pt complaining of SOB , all vitals within range

## 2017-10-21 NOTE — ED Provider Notes (Signed)
[facility]    CSN: [id number] Arrival date & time: 10/21/17  1009     History   Chief Complaint Chief Complaint  Patient presents with  . Food Poisoning    HPI Mikell D Ojeda is a 47 y.o. male.   47 year old male with history of DM, CKD, HTN, CHF, recent CVA x 2 comes in for acute onset of nausea, vomiting, diarrhea. Patient states symptoms started shortly after eating at a restaurant. He has had 4-5 episode of nonbilious, nonbloody vomit.  He has also had 4-5 episodes of diarrhea.  Denies melena, hematochezia.  He states has had low abdominal pain that is constant without any aggravating or alleviating factor.  Denies fever, chills, night sweats.  Denies other URI symptoms such as cough, congestion, sore throat.  Denies chest pain.  However, shortly after arriving to the clinic, complains of shortness of breath.  States sister has 8 mg Zofran, and had 2 doses, one prior to arrival, and has not had any vomiting since, though still with nausea.  Apart from hospitalization a month ago, has not had immobility.  Does have baseline erythema to bilateral lower extremity with mild swelling.  Denies increase in swelling.   Patient was admitted for CVA 09/06/2017, then again 09/14/2017.  At that time, he was told to stop spironolactone, metformin, lisinopril, due to acute AKI. States his PCP restarted him on the medicines 1 week ago.  He also finished his Plavix about a week ago.     Past Medical History:  Diagnosis Date  . CKD (chronic kidney disease) stage 3, GFR 30-59 ml/min (HCC)   . History of cardiomyopathy    LVEF 40 to 45% in April 2019 - subsequently normalized  . Hypertension   . Ischemic stroke (HCC)    Small left internal capsule infarct due to lacunar disease  . Morbid obesity (HCC)   . Type 2 diabetes mellitus [facility])     Patient Active Problem List   Diagnosis Date Noted  . Depression 10/14/2017  . Adjustment disorder with depressed mood 09/30/2017  . Stroke  (HCC) 09/14/2017  . Acute right-sided weakness   . AKI (acute kidney injury) (HCC)   . Congestive heart failure (HCC)   . Hyperlipidemia   . Stroke (cerebrum) (HCC) 09/07/2017  . Gout   . Acute ischemic stroke (HCC) 09/06/2017  . Anasarca   . Elevated LFTs   . Acute systolic HF (heart failure) (HCC)   . Obesity, Class III, BMI 40-49.9 (morbid obesity) (HCC)   . Hypoalbuminemia 07/11/2017  . Tinea corporis 07/11/2017  . Hyperbilirubinemia 07/10/2017  . Anemia 07/10/2017  . Type 2 diabetes mellitus with complication, without long-term current use of insulin (HCC) 07/10/2017  . Hypertension 07/10/2017    Past Surgical History:  Procedure Laterality Date  . HERNIA REPAIR         Home Medications    Prior to Admission medications   Medication Sig Start Date End Date Taking? Authorizing Provider  acetaminophen (TYLENOL) 500 MG tablet Take 500 mg by mouth every 6 (six) hours as needed for mild pain or moderate pain.    [provider]  aspirin 81 MG chewable tablet Chew 1 tablet (81 mg total) by mouth daily. 09/09/17   Myrene Buddy, MD  atorvastatin (LIPITOR) 40 MG tablet Take 1 tablet (40 mg total) by mouth daily at 6 PM. 10/14/17   Wendee Beavers, DO  carvedilol (COREG) 6.25 MG tablet Take 1 tablet (6.25 mg total)  by mouth 2 (two) times daily. 07/18/17 07/18/18  Vassie Loll, MD  lisinopril (PRINIVIL,ZESTRIL) 20 MG tablet Take 0.5 tablets (10 mg total) by mouth daily. Patient not taking: Reported on 10/14/2017 09/25/17   Casey Burkitt, MD  metFORMIN (GLUCOPHAGE) 500 MG tablet Take 1 tablet (500 mg total) by mouth 2 (two) times daily with a meal. Patient not taking: Reported on 10/14/2017 07/18/17 07/18/18  Vassie Loll, MD  spironolactone (ALDACTONE) 25 MG tablet Take 1 tablet (25 mg total) by mouth daily. Patient not taking: Reported on 10/14/2017 07/19/17   Vassie Loll, MD  torsemide (DEMADEX) 20 MG tablet Take 2 tablets (40 mg total) by mouth daily.  09/10/17   Jonelle Sidle, MD    Family History Family History  Problem Relation Age of Onset  . Diabetes Father   . Heart disease Father   . Diabetes Sister   . Diabetes Mother   . Stroke Mother   . Diabetes Maternal Grandmother   . Heart disease Maternal Grandmother   . Stroke Paternal Grandfather   . Heart disease Paternal Grandfather     Social History Social History   Tobacco Use  . Smoking status: Never Smoker  . Smokeless tobacco: Never Used  Substance Use Topics  . Alcohol use: Never    Frequency: Never  . Drug use: Not on file     Allergies   Patient has no known allergies.   Review of Systems Review of Systems  Reason unable to perform ROS: See HPI as above.   Physical Exam Triage Vital Signs ED Triage Vitals  Enc Vitals Group     BP 10/21/17 1024 126/84     Pulse Rate 10/21/17 1024 83     Resp 10/21/17 1024 20     Temp 10/21/17 1024 98.5 F (36.9 C)     Temp Source 10/21/17 1024 Oral     SpO2 10/21/17 1024 100 %     Weight --      Height --      Head Circumference --      Peak Flow --      Pain Score 10/21/17 1026 9     Pain Loc --      Pain Edu? --      Excl. in GC? --    No data found.  Updated Vital Signs BP 126/84 (BP Location: Left Arm)   Pulse 83   Temp 98.5 F (36.9 C) (Oral)   Resp 20   SpO2 100%   Physical Exam  Constitutional: He is oriented to person, place, and time. He appears well-developed and well-nourished. No distress.  HENT:  Head: Normocephalic and atraumatic.  Cardiovascular: Normal rate, regular rhythm and normal heart sounds. Exam reveals no gallop and no friction rub.  No murmur heard. Pulmonary/Chest: Effort normal and breath sounds normal. No accessory muscle usage or stridor. No respiratory distress. He has no decreased breath sounds. He has no wheezes. He has no rhonchi. He has no rales.  Patient talking in full sentences without difficulty.  No tenderness to palpation of the chest.  Abdominal: Soft.  Bowel sounds are normal.  Patient with  generalized tenderness to palpation of the abdomen without obvious rebound, guarding, rigidity.  Mesh felt at the left lower quadrant from prior hernia repair. No CVA tenderness.  Neurological: He is alert and oriented to person, place, and time.  Skin: Skin is warm and dry.  Psychiatric: He has a normal mood and affect. His behavior is normal.  Judgment normal.     UC Treatments / Results  Labs (all labs ordered are listed, but only abnormal results are displayed) Labs Reviewed  POCT I-STAT, CHEM 8 - Abnormal; Notable for the following components:      Result Value   BUN 38 (*)    Creatinine, Ser 1.80 (*)    Glucose, Bld 160 (*)    Calcium, Ion 1.06 (*)    All other components within normal limits  POCT URINALYSIS DIP (DEVICE) - Abnormal; Notable for the following components:   Bilirubin Urine SMALL (*)    Ketones, ur 15 (*)    pH 8.5 (*)    Protein, ur 100 (*)    Urobilinogen, UA 2.0 (*)    All other components within normal limits    EKG None  Radiology No results found.  Procedures Procedures (including critical care time)  Medications Ordered in UC Medications - No data to display  Initial Impression / Assessment and Plan / UC Course  I have reviewed the triage vital signs and the nursing notes.  Pertinent labs & imaging results that were available during my care of the patient were reviewed by me and considered in my medical decision making (see chart for details).    Discussed case with Dr. Delton See.  47 year old male with history of DM, CKD, HTN, CHF, recent CVA x 2 comes in for acute onset of nausea, vomiting, diarrhea.  He has had 4-5 episodes of nonbilious nonbloody vomit, 4-5 episodes of diarrhea.  Denies melena, hematochezia.  He has low abdominal pain that is constant.  He has felt short of breath, but has been able to speak in full sentences without difficulty.  Denies chest pain, increasing leg swelling, palpitation,  orthopnea.  He was hospitalized twice last month for CVA, and finished his Plavix a week ago.  During hospitalization, he was found to have AKI, creatinine of 1.96.  Recent repeat creatinine of 1.21, it was restarted on his spironolactone, metformin, lisinopril.  I-STAT today revealed creatinine of 1.8.  Given history and exam, will discharge patient in stable condition to the emergency department for further evaluation and management needed.  Patient expresses understanding and agrees to plan.  Final Clinical Impressions(s) / UC Diagnoses   Final diagnoses:  Elevated serum creatinine  Generalized abdominal pain  Nausea vomiting and diarrhea    ED Prescriptions    None        Belinda Fisher, PA-C 10/21/17 1142

## 2017-10-21 NOTE — ED Provider Notes (Signed)
[facility] EMERGENCY DEPARTMENT Provider Note   CSN: [id number] Arrival date & time: 10/21/17  1149     History   Chief Complaint Chief Complaint  Patient presents with  . Abdominal Pain  . Emesis  . Nausea  . Diarrhea    HPI Jeremiah Vance is a 47 y.o. male.  The history is provided by the patient. No language interpreter was used.     47 year old male with history of non-insulin-dependent diabetes, prior stroke, CHF, hypertension sent here from urgent care for further evaluation and management of his nausea vomiting diarrhea.  Since yesterday patient has had 4-5 episodes of nonbloody nonbilious vomitus as well as 4-5 episodes of nonbloody non-mucousy diarrhea.  Endorses no abdominal pain but has minimal in intensity. Report mild shortness of breath.  No report of fever, chills, chest pain, leg swelling, dyspnea on exertion.  He did report symptoms started last night at 12 PM but he was eating at [employer] earlier for dinner.  No prior abdominal surgeries.  Patient did mention being hospitalized in June for strokes.  He was found to be dehydrated likely secondary to several of his medication that was temporarily discontinued.  His PCP recently restarted him on his medication several days prior.  Past Medical History:  Diagnosis Date  . CKD (chronic kidney disease) stage 3, GFR 30-59 ml/min (HCC)   . History of cardiomyopathy    LVEF 40 to 45% in April 2019 - subsequently normalized  . Hypertension   . Ischemic stroke (HCC)    Small left internal capsule infarct due to lacunar disease  . Morbid obesity (HCC)   . Type 2 diabetes mellitus [facility])     Patient Active Problem List   Diagnosis Date Noted  . Depression 10/14/2017  . Adjustment disorder with depressed mood 09/30/2017  . Stroke (HCC) 09/14/2017  . Acute right-sided weakness   . AKI (acute kidney injury) (HCC)   . Congestive heart failure (HCC)   . Hyperlipidemia   . Stroke (cerebrum) (HCC)  09/07/2017  . Gout   . Acute ischemic stroke (HCC) 09/06/2017  . Anasarca   . Elevated LFTs   . Acute systolic HF (heart failure) (HCC)   . Obesity, Class III, BMI 40-49.9 (morbid obesity) (HCC)   . Hypoalbuminemia 07/11/2017  . Tinea corporis 07/11/2017  . Hyperbilirubinemia 07/10/2017  . Anemia 07/10/2017  . Type 2 diabetes mellitus with complication, without long-term current use of insulin (HCC) 07/10/2017  . Hypertension 07/10/2017    Past Surgical History:  Procedure Laterality Date  . HERNIA REPAIR          Home Medications    Prior to Admission medications   Medication Sig Start Date End Date Taking? Authorizing Provider  acetaminophen (TYLENOL) 500 MG tablet Take 500 mg by mouth every 6 (six) hours as needed for mild pain or moderate pain.    [provider]  aspirin 81 MG chewable tablet Chew 1 tablet (81 mg total) by mouth daily. 09/09/17   Myrene Buddy, MD  atorvastatin (LIPITOR) 40 MG tablet Take 1 tablet (40 mg total) by mouth daily at 6 PM. 10/14/17   Wendee Beavers, DO  carvedilol (COREG) 6.25 MG tablet Take 1 tablet (6.25 mg total) by mouth 2 (two) times daily. 07/18/17 07/18/18  Vassie Loll, MD  lisinopril (PRINIVIL,ZESTRIL) 20 MG tablet Take 0.5 tablets (10 mg total) by mouth daily. Patient not taking: Reported on 10/14/2017 09/25/17   Casey Burkitt, MD  metFORMIN (  GLUCOPHAGE) 500 MG tablet Take 1 tablet (500 mg total) by mouth 2 (two) times daily with a meal. Patient not taking: Reported on 10/14/2017 07/18/17 07/18/18  Vassie Loll, MD  spironolactone (ALDACTONE) 25 MG tablet Take 1 tablet (25 mg total) by mouth daily. Patient not taking: Reported on 10/14/2017 07/19/17   Vassie Loll, MD  torsemide (DEMADEX) 20 MG tablet Take 2 tablets (40 mg total) by mouth daily. 09/10/17   Jonelle Sidle, MD    Family History Family History  Problem Relation Age of Onset  . Diabetes Father   . Heart disease Father   . Diabetes Sister   .  Diabetes Mother   . Stroke Mother   . Diabetes Maternal Grandmother   . Heart disease Maternal Grandmother   . Stroke Paternal Grandfather   . Heart disease Paternal Grandfather     Social History Social History   Tobacco Use  . Smoking status: Never Smoker  . Smokeless tobacco: Never Used  Substance Use Topics  . Alcohol use: Never    Frequency: Never  . Drug use: Not on file     Allergies   Patient has no known allergies.   Review of Systems Review of Systems  All other systems reviewed and are negative.    Physical Exam Updated Vital Signs BP 127/82 (BP Location: Right Arm)   Pulse 91   Temp (!) 97.5 F (36.4 C) (Oral)   Resp 18   SpO2 100%   Physical Exam  Constitutional: He appears well-developed and well-nourished. No distress.  HENT:  Head: Atraumatic.  Eyes: Conjunctivae are normal.  Neck: Neck supple.  Cardiovascular: Normal rate and regular rhythm.  Pulmonary/Chest: Effort normal and breath sounds normal.  Abdominal: Soft. Normal appearance and bowel sounds are normal. There is tenderness in the right lower quadrant, suprapubic area and left lower quadrant. There is no CVA tenderness, no tenderness at McBurney's point and negative Murphy's sign.  Neurological: He is alert.  Skin: No rash noted.  Psychiatric: He has a normal mood and affect.  Nursing note and vitals reviewed.    ED Treatments / Results  Labs (all labs ordered are listed, but only abnormal results are displayed) Labs Reviewed  LIPASE, BLOOD - Abnormal; Notable for the following components:      Result Value   Lipase 52 (*)    All other components within normal limits  COMPREHENSIVE METABOLIC PANEL - Abnormal; Notable for the following components:   Glucose, Bld 154 (*)    BUN 39 (*)    Creatinine, Ser 1.84 (*)    Alkaline Phosphatase 138 (*)    Total Bilirubin 2.1 (*)    GFR calc non Af Amer 42 (*)    GFR calc Af Amer 49 (*)    All other components within normal limits    CBC - Abnormal; Notable for the following components:   WBC 12.4 (*)    All other components within normal limits  URINALYSIS, ROUTINE W REFLEX MICROSCOPIC - Abnormal; Notable for the following components:   APPearance HAZY (*)    Ketones, ur 5 (*)    Protein, ur 30 (*)    Bacteria, UA RARE (*)    All other components within normal limits    EKG None  Radiology No results found.  Procedures Procedures (including critical care time)  Medications Ordered in ED Medications  iopamidol (ISOVUE-300) 61 % injection (has no administration in time range)  morphine 4 MG/ML injection 4 mg (4 mg  Intravenous Given 10/21/17 1410)  ondansetron (ZOFRAN) injection 4 mg (4 mg Intravenous Given 10/21/17 1406)  sodium chloride 0.9 % bolus 1,000 mL (1,000 mLs Intravenous New Bag/Given 10/21/17 1412)     Initial Impression / Assessment and Plan / ED Course  I have reviewed the triage vital signs and the nursing notes.  Pertinent labs & imaging results that were available during my care of the patient were reviewed by me and considered in my medical decision making (see chart for details).     BP (!) 108/59 (BP Location: Right Arm)   Pulse 69   Temp 97.8 F (36.6 C) (Oral)   Resp 18   SpO2 98%    Final Clinical Impressions(s) / ED Diagnoses   Final diagnoses:  None    ED Discharge Orders    None     1:45 PM Patient here with nausea vomiting diarrhea after eating at [facility] [employer] last night.  Symptoms likely viral in etiology.  He does have a mildly elevated white count of 12.4, and evidence of AKI with BUN 39, creatinine 1.84.  His kidney function in the past has not been great.  He will receive IV fluid and pain medication.  Since he does have lower abdominal discomfort, will obtain abdominal and pelvic CT scan to rule out acute abdominal pathology such as diverticulitis or appendicitis. Care discussed with Dr. Denton Lank.  3:34 PM Pt signed out to oncoming provider, who will f/u on CT  scan and will reassess.  Anticipate discharge if CT unremarkable.  Pt currently receiving IVF and pain medication.     Fayrene Helper, PA-C 10/21/17 1534    Cathren Laine, MD 10/22/17 1401

## 2017-10-21 NOTE — ED Triage Notes (Signed)
Pt presents with food poisoning

## 2017-10-21 NOTE — Discharge Instructions (Signed)
Given your recent hospitalization with decrease kidney function, now with worsening kidney function again, with abdominal pain, please go to the emergency department for further evaluation needed.

## 2017-10-21 NOTE — H&P (Signed)
Jeremiah Vance is an 47 y.o. male.   Chief Complaint: Abdominal pain, nausea, vomiting, diarrhea HPI: Patient is a 47 year old male with a past medical history significant for hypertension, diabetes, history of stroke, morbid obesity, who states he began having abdominal pain, nausea and vomiting last night approximately 8 PM.  He did state that he noticed this hernia was bulging out and was not reducible.  Patient states that this continued overnight.  He states he had minimal sleep.  Patient did state that he had some diarrhea up until 4 AM this morning.  Secondary to continued abdominal pain, nausea vomiting he presented to the ER for further evaluation and management.  Patient does have a history of a previous ventral hernia repair and bowel obstruction approximately 11 years ago with primary repair of the hernia no mesh placement.  Upon evaluation in the ER the patient underwent CT scan which did reveal small bowel obstruction likely due to the ventral hernia that contained small bowel.  General surgery was consulted for further evaluation and management.  Past Medical History:  Diagnosis Date  . CKD (chronic kidney disease) stage 3, GFR 30-59 ml/min (HCC)   . History of cardiomyopathy    LVEF 40 to 45% in April 2019 - subsequently normalized  . Hypertension   . Ischemic stroke ([place])    Small left internal capsule infarct due to lacunar disease  . Morbid obesity ([place])   . Type 2 diabetes mellitus ([place])     Past Surgical History:  Procedure Laterality Date  . HERNIA REPAIR      Family History  Problem Relation Age of Onset  . Diabetes Father   . Heart disease Father   . Diabetes Sister   . Diabetes Mother   . Stroke Mother   . Diabetes Maternal Grandmother   . Heart disease Maternal Grandmother   . Stroke Paternal Grandfather   . Heart disease Paternal Grandfather    Social History:  reports that he has never smoked. He has never used smokeless tobacco. He reports that he  does not drink alcohol. His drug history is not on file.  Allergies: No Known Allergies   (Not in a hospital admission)  Results for orders placed or performed during the hospital encounter of 10/21/17 (from the past 48 hour(s))  Lipase, blood     Status: Abnormal   Collection Time: 10/21/17 12:07 PM  Result Value Ref Range   Lipase 52 (H) 11 - 51 U/L    Comment: Performed at [facility], [place] [address] [place], [place] [address]  Comprehensive metabolic panel     Status: Abnormal   Collection Time: 10/21/17 12:07 PM  Result Value Ref Range   Sodium 139 135 - 145 mmol/L   Potassium 4.1 3.5 - 5.1 mmol/L   Chloride 100 98 - 111 mmol/L   CO2 27 22 - 32 mmol/L   Glucose, Bld 154 (H) 70 - 99 mg/dL   BUN 39 (H) 6 - 20 mg/dL   Creatinine, Ser 1.84 (H) 0.61 - 1.24 mg/dL   Calcium 9.2 8.9 - 10.3 mg/dL   Total Protein 7.4 6.5 - 8.1 g/dL   Albumin 3.5 3.5 - 5.0 g/dL   AST 28 15 - 41 U/L   ALT 30 0 - 44 U/L   Alkaline Phosphatase 138 (H) 38 - 126 U/L   Total Bilirubin 2.1 (H) 0.3 - 1.2 mg/dL   GFR calc non Af Amer 42 (L) >60 mL/min   GFR  calc Af Amer 49 (L) >60 mL/min    Comment: (NOTE) The eGFR has been calculated using the CKD EPI equation. This calculation has not been validated in all clinical situations. eGFR's persistently <60 mL/min signify possible Chronic Kidney Disease.    Anion gap 12 5 - 15    Comment: Performed at [facility] [address] [place], [place] [address]  CBC     Status: Abnormal   Collection Time: 10/21/17 12:07 PM  Result Value Ref Range   WBC 12.4 (H) 4.0 - 10.5 K/uL   RBC 4.53 4.22 - 5.81 MIL/uL   Hemoglobin 13.1 13.0 - 17.0 g/dL   HCT 40.3 39.0 - 52.0 %   MCV 89.0 78.0 - 100.0 fL   MCH 28.9 26.0 - 34.0 pg   MCHC 32.5 30.0 - 36.0 g/dL   RDW 13.6 11.5 - 15.5 %   Platelets 334 150 - 400 K/uL    Comment: Performed at [facility], [place] [address], [place], [place] [address]  Urinalysis, Routine w reflex microscopic     Status:  Abnormal   Collection Time: 10/21/17 12:07 PM  Result Value Ref Range   Color, Urine YELLOW YELLOW   APPearance HAZY (A) CLEAR   Specific Gravity, Urine 1.018 1.005 - 1.030   pH 8.0 5.0 - 8.0   Glucose, UA NEGATIVE NEGATIVE mg/dL   Hgb urine dipstick NEGATIVE NEGATIVE   Bilirubin Urine NEGATIVE NEGATIVE   Ketones, ur 5 (A) NEGATIVE mg/dL   Protein, ur 30 (A) NEGATIVE mg/dL   Nitrite NEGATIVE NEGATIVE   Leukocytes, UA NEGATIVE NEGATIVE   RBC / HPF 0-5 0 - 5 RBC/hpf   WBC, UA 11-20 0 - 5 WBC/hpf   Bacteria, UA RARE (A) NONE SEEN   Squamous Epithelial / LPF 6-10 0 - 5   Hyaline Casts, UA PRESENT     Comment: Performed at [facility], [address] [place], [place] [address]   Ct Abdomen Pelvis Wo Contrast  Result Date: 10/21/2017 CLINICAL DATA:  Abdominal pain with appendicitis suspected. Noncontrast due to elevated creatinine. EXAM: CT ABDOMEN AND PELVIS WITHOUT CONTRAST TECHNIQUE: Multidetector CT imaging of the abdomen and pelvis was performed following the standard protocol without IV contrast. COMPARISON:  07/10/2017 FINDINGS: Lower chest: 5 mm and smaller pulmonary nodules in the right middle lobe and right lower lobe, stable from 2010 and benign. Hepatobiliary: Mild lobulation of the liver surface without definite cirrhosis.Cholelithiasis. No evidence of cholecystitis. Pancreas: Unremarkable. Spleen: Unremarkable. Adrenals/Urinary Tract: Negative adrenals. No hydronephrosis or ureteral stone. Small renal hilar calcifications appear vascular. Unremarkable bladder. Stomach/Bowel: Dilated and fluid-filled small bowel with transition point within a low midline hernia sac. Negative for bowel perforation or pneumatosis. No appendicitis. Vascular/Lymphatic: No acute vascular abnormality. Atherosclerotic calcification. No mass or adenopathy. Reproductive:Negative. Other: No ascites or pneumoperitoneum. Musculoskeletal: No acute abnormalities. Remote T12 compression fracture. IMPRESSION:  1. Small-bowel obstruction due to midline hernia. 2. Cholelithiasis. Electronically Signed   By: Monte Fantasia M.D.   On: 10/21/2017 18:51   Dg Abd Portable 1 View  Result Date: 10/21/2017 CLINICAL DATA:  Evaluate nasogastric tube placement. EXAM: PORTABLE ABDOMEN - 1 VIEW COMPARISON:  CT 10/21/2017 FINDINGS: Single view of the abdomen demonstrates a nasogastric tube within the left upper abdomen. The catheter tip is likely in the gastric fundus region. Again noted are dilated loops of small bowel. IMPRESSION: Nasogastric tube tip in the gastric fundus region. Electronically Signed   By: Scherrie Gerlach.D.  On: 10/21/2017 20:27    Review of Systems  Constitutional: Negative for chills, fever and malaise/fatigue.  HENT: Negative for ear discharge, hearing loss and sore throat.   Eyes: Negative for blurred vision and discharge.  Respiratory: Negative for cough and shortness of breath.   Cardiovascular: Negative for chest pain, orthopnea and leg swelling.  Gastrointestinal: Positive for abdominal pain, diarrhea, nausea and vomiting. Negative for constipation and heartburn.  Musculoskeletal: Negative for myalgias and neck pain.  Skin: Negative for itching and rash.  Neurological: Negative for dizziness, focal weakness, seizures and loss of consciousness.  Endo/Heme/Allergies: Negative for environmental allergies. Does not bruise/bleed easily.  Psychiatric/Behavioral: Negative for depression and suicidal ideas.  All other systems reviewed and are negative.   Blood pressure (!) 107/56, pulse 72, temperature 97.8 F (36.6 C), temperature source Oral, resp. rate 18, SpO2 98 %. Physical Exam  Constitutional: He is oriented to person, place, and time. Vital signs are normal. He appears well-developed and well-nourished.  Conversant No acute distress  Eyes: Lids are normal. No scleral icterus.  No lid lag Moist conjunctiva  Neck: No tracheal tenderness present. No thyromegaly present.  No  cervical lymphadenopathy  Cardiovascular: Normal rate, regular rhythm and intact distal pulses.  No murmur heard. Respiratory: Effort normal and breath sounds normal. He has no wheezes. He has no rales.  GI: Soft. He exhibits no distension. There is no hepatosplenomegaly. There is tenderness in the periumbilical area. There is no rebound and no guarding. A hernia is present. Hernia confirmed positive in the ventral area.    Neurological: He is alert and oriented to person, place, and time.  Normal gait and station  Skin: Skin is warm. No rash noted. No cyanosis. Nails show no clubbing.  Normal skin turgor  Psychiatric: Judgment normal.  Appropriate affect     Assessment/Plan 47 year old male with incarcerated ventral hernia causing small bowel obstruction Hypertension Diabetes History of CVA Morbid obesity  1.  We will admit the patient, keep n.p.o., NG tube placement, and IV fluid resuscitation. 2.  Patient will likely require diagnostic laparoscopy versus exploratory laparotomy and ventral hernia repair.  We will discuss the patient with Dr. Dalbert Mayotte, MD 10/21/2017, 10:43 PM

## 2017-10-21 NOTE — ED Notes (Signed)
Patient transported to CT 

## 2017-10-22 ENCOUNTER — Encounter (HOSPITAL_COMMUNITY): Payer: Self-pay | Admitting: Certified Registered Nurse Anesthetist

## 2017-10-22 ENCOUNTER — Encounter (HOSPITAL_COMMUNITY): Admission: EM | Disposition: A | Discharge status: Home / Self Care | Payer: Self-pay | Source: Home / Self Care

## 2017-10-22 ENCOUNTER — Other Ambulatory Visit: Payer: Self-pay

## 2017-10-22 ENCOUNTER — Inpatient Hospital Stay (HOSPITAL_COMMUNITY): Payer: Self-pay | Admitting: Certified Registered Nurse Anesthetist

## 2017-10-22 HISTORY — PX: VENTRAL HERNIA REPAIR: SHX424

## 2017-10-22 LAB — BASIC METABOLIC PANEL
Anion gap: 8 (ref 5–15)
BUN: 38 mg/dL — AB (ref 6–20)
CHLORIDE: 106 mmol/L (ref 98–111)
CO2: 28 mmol/L (ref 22–32)
CREATININE: 1.6 mg/dL — AB (ref 0.61–1.24)
Calcium: 8.2 mg/dL — ABNORMAL LOW (ref 8.9–10.3)
GFR calc Af Amer: 58 mL/min — ABNORMAL LOW (ref >60)
GFR calc non Af Amer: 50 mL/min — ABNORMAL LOW (ref >60)
Glucose, Bld: 121 mg/dL — ABNORMAL HIGH (ref 70–99)
Potassium: 3.9 mmol/L (ref 3.5–5.1)
SODIUM: 142 mmol/L (ref 135–145)

## 2017-10-22 LAB — GLUCOSE, CAPILLARY
GLUCOSE-CAPILLARY: 108 mg/dL — AB (ref 70–99)
GLUCOSE-CAPILLARY: 128 mg/dL — AB (ref 70–99)
Glucose-Capillary: 105 mg/dL — ABNORMAL HIGH (ref 70–99)
Glucose-Capillary: 144 mg/dL — ABNORMAL HIGH (ref 70–99)
Glucose-Capillary: 159 mg/dL — ABNORMAL HIGH (ref 70–99)
Glucose-Capillary: 117 mg/dL — ABNORMAL HIGH (ref 70–99)

## 2017-10-22 LAB — CBC
HCT: 32.9 % — ABNORMAL LOW (ref 39.0–52.0)
Hemoglobin: 10.6 g/dL — ABNORMAL LOW (ref 13.0–17.0)
MCH: 29 pg (ref 26.0–34.0)
MCHC: 32.2 g/dL (ref 30.0–36.0)
MCV: 90.1 fL (ref 78.0–100.0)
PLATELETS: 254 10*3/uL (ref 150–400)
RBC: 3.65 MIL/uL — ABNORMAL LOW (ref 4.22–5.81)
RDW: 13.9 % (ref 11.5–15.5)
WBC: 7.9 10*3/uL (ref 4.0–10.5)

## 2017-10-22 LAB — MRSA PCR SCREENING: MRSA by PCR: NEGATIVE

## 2017-10-22 SURGERY — REPAIR, HERNIA, VENTRAL
Anesthesia: General

## 2017-10-22 MED ORDER — DEXAMETHASONE SODIUM PHOSPHATE 10 MG/ML IJ SOLN
INTRAMUSCULAR | Status: AC
  Filled 2017-10-22: qty 1

## 2017-10-22 MED ORDER — ACETAMINOPHEN 10 MG/ML IV SOLN
1000.0000 mg | Freq: Four times a day (QID) | INTRAVENOUS | Status: AC
  Administered 2017-10-22 – 2017-10-23 (×4): 1000 mg via INTRAVENOUS
  Filled 2017-10-22 (×4): qty 100

## 2017-10-22 MED ORDER — BUPIVACAINE-EPINEPHRINE (PF) 0.25% -1:200000 IJ SOLN
INTRAMUSCULAR | Status: AC
  Filled 2017-10-22: qty 30

## 2017-10-22 MED ORDER — DEXAMETHASONE SODIUM PHOSPHATE 10 MG/ML IJ SOLN
INTRAMUSCULAR | Status: DC | PRN
  Administered 2017-10-22: 8 mg via INTRAVENOUS

## 2017-10-22 MED ORDER — SODIUM CHLORIDE 0.9 % IV SOLN
INTRAVENOUS | Status: DC | PRN
  Administered 2017-10-22: 15 ug/min via INTRAVENOUS

## 2017-10-22 MED ORDER — ROCURONIUM BROMIDE 10 MG/ML (PF) SYRINGE
PREFILLED_SYRINGE | INTRAVENOUS | Status: AC
  Filled 2017-10-22: qty 10

## 2017-10-22 MED ORDER — DEXMEDETOMIDINE HCL 200 MCG/2ML IV SOLN
INTRAVENOUS | Status: DC | PRN
  Administered 2017-10-22: 30 ug via INTRAVENOUS

## 2017-10-22 MED ORDER — MIDAZOLAM HCL 5 MG/5ML IJ SOLN
INTRAMUSCULAR | Status: DC | PRN
  Administered 2017-10-22: 2 mg via INTRAVENOUS

## 2017-10-22 MED ORDER — SODIUM CHLORIDE 0.9 % IV SOLN
INTRAVENOUS | Status: DC
  Administered 2017-10-22: 10:00:00 via INTRAVENOUS

## 2017-10-22 MED ORDER — LACTATED RINGERS IV SOLN: INTRAVENOUS | Status: DC

## 2017-10-22 MED ORDER — DEXTROSE 5 % IV SOLN
3.0000 g | INTRAVENOUS | Status: DC
  Filled 2017-10-22: qty 3000

## 2017-10-22 MED ORDER — FENTANYL CITRATE (PF) 250 MCG/5ML IJ SOLN
INTRAMUSCULAR | Status: AC
  Filled 2017-10-22: qty 5

## 2017-10-22 MED ORDER — METOPROLOL TARTRATE 5 MG/5ML IV SOLN
2.5000 mg | Freq: Four times a day (QID) | INTRAVENOUS | Status: DC
  Administered 2017-10-22 – 2017-10-25 (×13): 2.5 mg via INTRAVENOUS
  Filled 2017-10-22 (×13): qty 5

## 2017-10-22 MED ORDER — ENOXAPARIN SODIUM 60 MG/0.6ML ~~LOC~~ SOLN
60.0000 mg | SUBCUTANEOUS | Status: DC
  Administered 2017-10-23 – 2017-10-28 (×6): 60 mg via SUBCUTANEOUS
  Filled 2017-10-22 (×6): qty 0.6

## 2017-10-22 MED ORDER — SUCCINYLCHOLINE CHLORIDE 200 MG/10ML IV SOSY
PREFILLED_SYRINGE | INTRAVENOUS | Status: DC | PRN
  Administered 2017-10-22: 70 mg via INTRAVENOUS

## 2017-10-22 MED ORDER — ROCURONIUM BROMIDE 10 MG/ML (PF) SYRINGE
PREFILLED_SYRINGE | INTRAVENOUS | Status: DC | PRN
  Administered 2017-10-22: 20 mg via INTRAVENOUS
  Administered 2017-10-22: 50 mg via INTRAVENOUS
  Administered 2017-10-22: 20 mg via INTRAVENOUS
  Administered 2017-10-22: 10 mg via INTRAVENOUS

## 2017-10-22 MED ORDER — ONDANSETRON HCL 4 MG/2ML IJ SOLN
INTRAMUSCULAR | Status: DC | PRN
  Administered 2017-10-22: 4 mg via INTRAVENOUS

## 2017-10-22 MED ORDER — ONDANSETRON HCL 4 MG/2ML IJ SOLN: 4.0000 mg | Freq: Once | INTRAMUSCULAR | Status: DC | PRN

## 2017-10-22 MED ORDER — PROPOFOL 10 MG/ML IV BOLUS
INTRAVENOUS | Status: DC | PRN
  Administered 2017-10-22: 150 mg via INTRAVENOUS

## 2017-10-22 MED ORDER — MIDAZOLAM HCL 2 MG/2ML IJ SOLN
INTRAMUSCULAR | Status: AC
  Filled 2017-10-22: qty 2

## 2017-10-22 MED ORDER — BUPIVACAINE-EPINEPHRINE 0.25% -1:200000 IJ SOLN
INTRAMUSCULAR | Status: DC | PRN
  Administered 2017-10-22: 30 mL

## 2017-10-22 MED ORDER — ACETAMINOPHEN 10 MG/ML IV SOLN
INTRAVENOUS | Status: AC
  Administered 2017-10-22: 1000 mg
  Filled 2017-10-22: qty 100

## 2017-10-22 MED ORDER — SUGAMMADEX SODIUM 200 MG/2ML IV SOLN
INTRAVENOUS | Status: AC
  Filled 2017-10-22: qty 2

## 2017-10-22 MED ORDER — CEFAZOLIN (ANCEF) 1 G IV SOLR
3.0000 g | INTRAVENOUS | Status: AC
  Administered 2017-10-22: 3 g
  Filled 2017-10-22: qty 3

## 2017-10-22 MED ORDER — LACTATED RINGERS IV SOLN
INTRAVENOUS | Status: DC | PRN
  Administered 2017-10-22 (×2): via INTRAVENOUS

## 2017-10-22 MED ORDER — SODIUM CHLORIDE 0.9 % IV SOLN
INTRAVENOUS | Status: DC
  Administered 2017-10-22 – 2017-10-26 (×10): via INTRAVENOUS

## 2017-10-22 MED ORDER — ONDANSETRON HCL 4 MG/2ML IJ SOLN
INTRAMUSCULAR | Status: AC
  Filled 2017-10-22: qty 2

## 2017-10-22 MED ORDER — FENTANYL CITRATE (PF) 100 MCG/2ML IJ SOLN
INTRAMUSCULAR | Status: DC | PRN
  Administered 2017-10-22: 100 ug via INTRAVENOUS
  Administered 2017-10-22 (×3): 50 ug via INTRAVENOUS
  Administered 2017-10-22: 100 ug via INTRAVENOUS

## 2017-10-22 MED ORDER — LIDOCAINE 20MG/ML (2%) 15 ML SYRINGE OPTIME
INTRAMUSCULAR | Status: DC | PRN
  Administered 2017-10-22: 60 mg via INTRAVENOUS

## 2017-10-22 MED ORDER — LIDOCAINE 2% (20 MG/ML) 5 ML SYRINGE
INTRAMUSCULAR | Status: AC
  Filled 2017-10-22: qty 5

## 2017-10-22 MED ORDER — HYDROMORPHONE HCL 1 MG/ML IJ SOLN: 0.2500 mg | INTRAMUSCULAR | Status: DC | PRN

## 2017-10-22 MED ORDER — SUGAMMADEX SODIUM 200 MG/2ML IV SOLN
INTRAVENOUS | Status: DC | PRN
  Administered 2017-10-22: 241.2 mg via INTRAVENOUS

## 2017-10-22 MED ORDER — 0.9 % SODIUM CHLORIDE (POUR BTL) OPTIME
TOPICAL | Status: DC | PRN
  Administered 2017-10-22: 1000 mL

## 2017-10-22 SURGICAL SUPPLY — 61 items
ADH SKN CLS APL DERMABOND .7 (GAUZE/BANDAGES/DRESSINGS) ×1
BINDER ABDOMINAL 12 ML 46-62 (SOFTGOODS) ×2 IMPLANT
BIOPATCH RED 1 DISK 7.0 (GAUZE/BANDAGES/DRESSINGS) ×1 IMPLANT
BIOPATCH RED 1IN DISK 7.0MM (GAUZE/BANDAGES/DRESSINGS) ×1
BLADE CLIPPER SURG (BLADE) IMPLANT
CANISTER SUCT 3000ML PPV (MISCELLANEOUS) ×3 IMPLANT
CHLORAPREP W/TINT 26ML (MISCELLANEOUS) ×3 IMPLANT
COVER SURGICAL LIGHT HANDLE (MISCELLANEOUS) ×3 IMPLANT
DERMABOND ADVANCED (GAUZE/BANDAGES/DRESSINGS) ×2
DERMABOND ADVANCED .7 DNX12 (GAUZE/BANDAGES/DRESSINGS) IMPLANT
DEVICE SECURE STRAP 25 ABSORB (INSTRUMENTS) IMPLANT
DEVICE TROCAR PUNCTURE CLOSURE (ENDOMECHANICALS) ×2 IMPLANT
DRAIN CHANNEL 19F RND (DRAIN) ×2 IMPLANT
DRAPE INCISE IOBAN 66X45 STRL (DRAPES) ×3 IMPLANT
DRAPE LAPAROSCOPIC ABDOMINAL (DRAPES) ×3 IMPLANT
DRAPE UTILITY XL STRL (DRAPES) ×6 IMPLANT
DRSG OPSITE POSTOP 4X8 (GAUZE/BANDAGES/DRESSINGS) ×2 IMPLANT
DRSG TEGADERM 4X4.75 (GAUZE/BANDAGES/DRESSINGS) ×2 IMPLANT
ELECT CAUTERY BLADE 6.4 (BLADE) ×3 IMPLANT
ELECT REM PT RETURN 9FT ADLT (ELECTROSURGICAL) ×3
ELECTRODE REM PT RTRN 9FT ADLT (ELECTROSURGICAL) ×1 IMPLANT
EVACUATOR SILICONE 100CC (DRAIN) ×2 IMPLANT
GAUZE SPONGE 4X4 12PLY STRL (GAUZE/BANDAGES/DRESSINGS) IMPLANT
GLOVE BIO SURGEON STRL SZ7.5 (GLOVE) ×2 IMPLANT
GLOVE BIOGEL PI IND STRL 8 (GLOVE) ×1 IMPLANT
GLOVE BIOGEL PI INDICATOR 8 (GLOVE) ×4
GLOVE ECLIPSE 7.5 STRL STRAW (GLOVE) ×3 IMPLANT
GOWN STRL REUS W/ TWL LRG LVL3 (GOWN DISPOSABLE) ×2 IMPLANT
GOWN STRL REUS W/ TWL XL LVL3 (GOWN DISPOSABLE) ×1 IMPLANT
GOWN STRL REUS W/TWL LRG LVL3 (GOWN DISPOSABLE) ×6
GOWN STRL REUS W/TWL XL LVL3 (GOWN DISPOSABLE) ×3
KIT BASIN OR (CUSTOM PROCEDURE TRAY) ×3 IMPLANT
KIT TURNOVER KIT B (KITS) ×3 IMPLANT
MARKER SKIN DUAL TIP RULER LAB (MISCELLANEOUS) ×5 IMPLANT
MESH PHASIX RESORB RECT 10X15 (Mesh General) ×2 IMPLANT
NDL HYPO 25GX1X1/2 BEV (NEEDLE) ×1 IMPLANT
NEEDLE HYPO 25GX1X1/2 BEV (NEEDLE) ×3 IMPLANT
NS IRRIG 1000ML POUR BTL (IV SOLUTION) ×3 IMPLANT
PACK GENERAL/GYN (CUSTOM PROCEDURE TRAY) ×3 IMPLANT
PAD ARMBOARD 7.5X6 YLW CONV (MISCELLANEOUS) ×6 IMPLANT
RETAINER VISCERA MED (MISCELLANEOUS) ×2 IMPLANT
SPONGE LAP 18X18 RF (DISPOSABLE) ×2 IMPLANT
STAPLER VISISTAT 35W (STAPLE) ×2 IMPLANT
SUT ETHILON 3 0 FSL (SUTURE) ×2 IMPLANT
SUT NOVA 1 T20/GS 25DT (SUTURE) IMPLANT
SUT NOVA NAB GS-21 0 18 T12 DT (SUTURE) ×4 IMPLANT
SUT NOVA NAB GS-21 1 T12 (SUTURE) ×4 IMPLANT
SUT SILK 2 0 SH CR/8 (SUTURE) ×2 IMPLANT
SUT SILK 3 0 SH CR/8 (SUTURE) IMPLANT
SUT VIC AB 0 CT1 27 (SUTURE) ×6
SUT VIC AB 0 CT1 27XBRD ANBCTR (SUTURE) IMPLANT
SUT VIC AB 2-0 CT1 36 (SUTURE) IMPLANT
SUT VIC AB 3-0 54X BRD REEL (SUTURE) IMPLANT
SUT VIC AB 3-0 BRD 54 (SUTURE) ×3
SUT VIC AB 3-0 SH 18 (SUTURE) ×2 IMPLANT
SUT VIC AB 3-0 SH 27 (SUTURE)
SUT VIC AB 3-0 SH 27XBRD (SUTURE) IMPLANT
SYR CONTROL 10ML LL (SYRINGE) ×3 IMPLANT
TOWEL OR 17X24 6PK STRL BLUE (TOWEL DISPOSABLE) ×3 IMPLANT
TOWEL OR 17X26 10 PK STRL BLUE (TOWEL DISPOSABLE) ×3 IMPLANT
TRAY FOLEY MTR SLVR 14FR STAT (SET/KITS/TRAYS/PACK) IMPLANT

## 2017-10-22 NOTE — Progress Notes (Signed)
Patient arrived to 6n06 drowsy but oriented, VSS, IV intact and infusing.  Noted to have ng tube in place and midline honeycomb dressing along with jp drain to RLQ.  No family at bedside.  Patient complains of pain at surgical site 8/10.  Will continue to monitor.

## 2017-10-22 NOTE — Plan of Care (Signed)
  Problem: Pain Managment: Goal: General experience of comfort will improve Outcome: Progressing   Problem: Safety: Goal: Ability to remain free from injury will improve Outcome: Progressing   

## 2017-10-22 NOTE — Progress Notes (Signed)
Report given to heather rn as caregiver 

## 2017-10-22 NOTE — Anesthesia Preprocedure Evaluation (Signed)
Anesthesia Evaluation  Patient identified by MRN, date of birth, ID band Patient awake    Reviewed: Allergy & Precautions, NPO status , Patient's Chart, lab work & pertinent test results  Airway Mallampati: III  TM Distance: >3 FB Neck ROM: Full    Dental  (+) Teeth Intact, Dental Advisory Given   Pulmonary    breath sounds clear to auscultation       Cardiovascular hypertension,  Rhythm:Regular Rate:Normal     Neuro/Psych    GI/Hepatic   Endo/Other  diabetes  Renal/GU      Musculoskeletal   Abdominal (+) + obese,  Abdomen: tender.    Peds  Hematology   Anesthesia Other Findings   Reproductive/Obstetrics                             Anesthesia Physical Anesthesia Plan  ASA: III  Anesthesia Plan: General   Post-op Pain Management:    Induction: Intravenous  PONV Risk Score and Plan: Ondansetron and Dexamethasone  Airway Management Planned:   Additional Equipment:   Intra-op Plan:   Post-operative Plan: Extubation in OR  Informed Consent: I have reviewed the patients History and Physical, chart, labs and discussed the procedure including the risks, benefits and alternatives for the proposed anesthesia with the patient or authorized representative who has indicated his/her understanding and acceptance.   Dental advisory given  Plan Discussed with: CRNA and Anesthesiologist  Anesthesia Plan Comments:         Anesthesia Quick Evaluation

## 2017-10-22 NOTE — Op Note (Signed)
Preoperative Diagnosis: Incarcerated recurrent ventral incisional hernia  Postoprative Diagnosis: Same  Procedure: Procedure(s): Repair of incarcerated recurrent ventral incisional hernia with mesh, retrorectus    Surgeon: Glenna Fellows T   Assistants: Zola Button, [place]  Anesthesia:  General endotracheal anesthesia  Indications: Patient is a 47 year old male with morbid obesity and remote history of repair of primary ventral hernia with bowel obstruction, primarily repaired.  He now presents with acute abdominal pain and CT findings of recurrent ventral hernia in the midline and small bowel obstruction.  He was admitted and placed on bowel rest and NG suction.  His hernia is soft and partially reducible but very tender.  With these findings I recommended emergency repair.  We discussed the nature of the surgery and indications, use of mesh and risks of anesthetic complications, bleeding, infection and recurrent hernia.  He understands and desires to proceed.    Procedure Detail: Patient was brought to the operating room, placed in the supine position on the operating table, and general endotracheal anesthesia induced.  He received preoperative IV antibiotics.  PAS were in place.  Foley catheter was placed.  The abdomen was widely sterilely prepped and draped.  Patient timeout was performed and correct procedure verified.  I used the previous midline incision added just below the umbilicus over the palpable hernia mass and dissection was carried down through the subtenons tissue.  The hernia sac was identified and partially dissected from subcutaneous tissue and opened.  There were numerous small bowel adhesions in the hernia sac and to the anterior abdominal wall around the edge of the hernia defect with old suture material.  These were all carefully taken down with sharp dissection, somewhat tedious like the dissection of about 1 hour until the adhesions were all lysed and the anterior  abdominal wall completely cleared.  I carefully inspected and ran the small intestine.  There was some edema and moderate distention proximally but no evidence of injury.  A few interloop adhesions were lysed and the viscera were reduced.  The hernia sac was dissected away from subcutaneous tissue and excised back to the edge of the fascial defect.  This left a defect of approximately 7 cm.  I elected to repair this with retrorectus technique.  On either side the fascia was elevated and the peritoneum incised and dissection deepened to the rectus muscle.  The retrorectus plane was entered and easily bluntly dissected back to the lateral edge of the rectus muscle.  This dissection was accomplished on either side.  The plane was then developed in the midline superiorly and inferiorly deep to the midline fascia another 5 cm in either direction.  The peritoneum and posterior fascia was then closed with running 0 Vicryl in the midline.  The space was measured at 15 x 12 cm and a piece of phasic's mesh was trimmed to size.  0 Novafil stay sutures were used initially at the 12 and 6 o'clock position brought up through the abdominal wall with the Endo Close.  2 additional stay sutures were then placed on either side deploying the mesh out to the lateral edges of the dissection.  There was nice broad deployment.  The space was irrigated.  A 19 Blake drain was left in the space and brought out through separate stab wound.  The midline fascia was closed with running #1 Novafil begun at either end of the incision and tied centrally.  Subcutaneous tissue was irrigated and closed with staples.  Sponge needle and instrument counts were  correct.   Findings: As above  Estimated Blood Loss:  less than 100 mL         Drains: 19 Blake drain in retrorectus space  Blood Given: none          Specimens: None        Complications:  * No complications entered in OR log *         Disposition: PACU - hemodynamically stable.          Condition: stable

## 2017-10-22 NOTE — Progress Notes (Signed)
2355 pt received from ED. A&O x4, pain 7/10 lower abdomen. Ambulated bed to bed, sat SOB for assessment. Will continue to monitor.

## 2017-10-22 NOTE — Anesthesia Postprocedure Evaluation (Signed)
Anesthesia Post Note  Patient: Claudio D Mastrangelo  Procedure(s) Performed: OPEN REPAIR INCARCERATED VENTRAL HERNIA (N/A )     Patient location during evaluation: PACU Anesthesia Type: General Level of consciousness: awake and alert Pain management: pain level controlled Vital Signs Assessment: post-procedure vital signs reviewed and stable Respiratory status: spontaneous breathing, nonlabored ventilation, respiratory function stable and patient connected to nasal cannula oxygen Cardiovascular status: blood pressure returned to baseline and stable Postop Assessment: no apparent nausea or vomiting Anesthetic complications: no    Last Vitals:  Vitals:   10/22/17 1432 10/22/17 1834  BP: 136/69 131/65  Pulse: 74 64  Resp: 12   Temp: 36.8 C 36.6 C  SpO2: 97% 99%    Last Pain:  Vitals:   10/22/17 1834  TempSrc: Oral  PainSc:                  Twala Collings COKER

## 2017-10-22 NOTE — Transfer of Care (Signed)
Immediate Anesthesia Transfer of Care Note  Patient: Jeremiah Vance  Procedure(s) Performed: OPEN REPAIR INCARCERATED VENTRAL HERNIA (N/A )  Patient Location: PACU  Anesthesia Type:General  Level of Consciousness: awake, drowsy and patient cooperative  Airway & Oxygen Therapy: Patient Spontanous Breathing and Patient connected to nasal cannula oxygen  Post-op Assessment: Report given to RN, Post -op Vital signs reviewed and stable and Patient moving all extremities  Post vital signs: Reviewed and stable  Last Vitals:  Vitals Value Taken Time  BP 144/74 10/22/2017  1:21 PM  Temp    Pulse 73 10/22/2017  1:22 PM  Resp 8 10/22/2017  1:22 PM  SpO2 100 % 10/22/2017  1:22 PM  Vitals shown include unvalidated device data.  Last Pain:  Vitals:   10/22/17 0855  TempSrc:   PainSc: 0-No pain      Patients Stated Pain Goal: 5 (10/21/17 2356)  Complications: No apparent anesthesia complications

## 2017-10-22 NOTE — Anesthesia Procedure Notes (Signed)
Procedure Name: Intubation Date/Time: 10/22/2017 10:19 AM Performed by: Lowella Dell, CRNA Pre-anesthesia Checklist: Patient identified, Emergency Drugs available, Suction available and Patient being monitored Patient Re-evaluated:Patient Re-evaluated prior to induction Oxygen Delivery Method: Circle System Utilized Preoxygenation: Pre-oxygenation with 100% oxygen Induction Type: IV induction, Cricoid Pressure applied and Rapid sequence Laryngoscope Size: Mac and 4 Grade View: Grade I Tube type: Oral Tube size: 7.5 mm Number of attempts: 1 Airway Equipment and Method: Stylet Placement Confirmation: ETT inserted through vocal cords under direct vision,  positive ETCO2 and breath sounds checked- equal and bilateral Secured at: 21 cm Tube secured with: Tape Dental Injury: Teeth and Oropharynx as per pre-operative assessment

## 2017-10-22 NOTE — Care Management Note (Signed)
Case Management Note  Patient Details  Name: RANY TENESACA MRN: [id number] Date of Birth: September 10, 1970  Subjective/Objective:          Spoke to patient briefly at bedside postoperatively. He states he pays out of pocket for medication, goes to [facility]. Will continue to follow for DC needs like MATCH letter. PCP is [facility]           Action/Plan:   Expected Discharge Date:                  Expected Discharge Plan:     In-House Referral:     Discharge planning Services  CM Consult  Post Acute Care Choice:    Choice offered to:     DME Arranged:    DME Agency:     HH Arranged:    HH Agency:     Status of Service:  In process, will continue to follow  If discussed at Long Length of Stay Meetings, dates discussed:    Additional Comments:  Lawerance Sabal, RN 10/22/2017, 3:13 PM

## 2017-10-22 NOTE — Progress Notes (Signed)
Patient ID: Jeremiah Vance, male   DOB: [birth year], 47 y.o.   MRN: [id number]     Subjective: Complaining of pain at his hernia site and sore throat from the NG tube.  Objective: Vital signs in last 24 hours: Temp:  [97.5 F (36.4 C)-98.7 F (37.1 C)] 98 F (36.7 C) (07/24 0420) Pulse Rate:  [66-91] 66 (07/24 0420) Resp:  [16-20] 17 (07/24 0420) BP: (107-136)/(53-84) 136/73 (07/24 0420) SpO2:  [62 %-100 %] 99 % (07/24 0420) Weight:  [120.6 kg (265 lb 14 oz)] 120.6 kg (265 lb 14 oz) (07/24 0004) Last BM Date: 10/21/17  Intake/Output from previous day: 07/23 0701 - 07/24 0700 In: 1409 [I.V.:409; IV Piggyback:1000] Out: 175 [Emesis/NG output:175] Intake/Output this shift: No intake/output data recorded.  General appearance: alert, cooperative, mild distress and morbidly obese Resp: clear to auscultation bilaterally GI: Obese.  Tender partially reducible soft midline hernia.  Lab Results:  Recent Labs    10/21/17 1207 10/22/17 0550  WBC 12.4* 7.9  HGB 13.1 10.6*  HCT 40.3 32.9*  PLT 334 254   BMET Recent Labs    10/21/17 1207 10/22/17 0550  NA 139 142  K 4.1 3.9  CL 100 106  CO2 27 28  GLUCOSE 154* 121*  BUN 39* 38*  CREATININE 1.84* 1.60*  CALCIUM 9.2 8.2*     Studies/Results: Ct Abdomen Pelvis Wo Contrast  Result Date: 10/21/2017 CLINICAL DATA:  Abdominal pain with appendicitis suspected. Noncontrast due to elevated creatinine. EXAM: CT ABDOMEN AND PELVIS WITHOUT CONTRAST TECHNIQUE: Multidetector CT imaging of the abdomen and pelvis was performed following the standard protocol without IV contrast. COMPARISON:  07/10/2017 FINDINGS: Lower chest: 5 mm and smaller pulmonary nodules in the right middle lobe and right lower lobe, stable from 2010 and benign. Hepatobiliary: Mild lobulation of the liver surface without definite cirrhosis.Cholelithiasis. No evidence of cholecystitis. Pancreas: Unremarkable. Spleen: Unremarkable. Adrenals/Urinary Tract: Negative  adrenals. No hydronephrosis or ureteral stone. Small renal hilar calcifications appear vascular. Unremarkable bladder. Stomach/Bowel: Dilated and fluid-filled small bowel with transition point within a low midline hernia sac. Negative for bowel perforation or pneumatosis. No appendicitis. Vascular/Lymphatic: No acute vascular abnormality. Atherosclerotic calcification. No mass or adenopathy. Reproductive:Negative. Other: No ascites or pneumoperitoneum. Musculoskeletal: No acute abnormalities. Remote T12 compression fracture. IMPRESSION: 1. Small-bowel obstruction due to midline hernia. 2. Cholelithiasis. Electronically Signed   By: Marnee Spring M.D.   On: 10/21/2017 18:51   Dg Abd Portable 1 View  Result Date: 10/21/2017 CLINICAL DATA:  Evaluate nasogastric tube placement. EXAM: PORTABLE ABDOMEN - 1 VIEW COMPARISON:  CT 10/21/2017 FINDINGS: Single view of the abdomen demonstrates a nasogastric tube within the left upper abdomen. The catheter tip is likely in the gastric fundus region. Again noted are dilated loops of small bowel. IMPRESSION: Nasogastric tube tip in the gastric fundus region. Electronically Signed   By: Richarda Overlie M.D.   On: 10/21/2017 20:27    Anti-infectives: Anti-infectives (From admission, onward)   Start     Dose/Rate Route Frequency Ordered Stop   10/22/17 0830  ceFAZolin (ANCEF) powder 3 g     3 g Other To Surgery 10/22/17 0828 10/23/17 0830      Assessment/Plan: Morbidly obese diabetic male with previous history of primary repair of midline ventral hernia.  Presents with acute bowel obstruction secondary to incarcerated incisional hernia.  His hernia feels soft and at least partially reducible but he is tender.  This will need to be repaired and I do not see any advantage  to further nonoperative management.  I recommended proceeding with open repair likely with mesh depending on operative findings.  I discussed the nature of the procedure and indications with him, expected  recovery, risks of anesthetic complications, cardiorespiratory complications, bleeding, infection, recurrence or bowel injury.  We discussed he has risk factors for recurrence including obesity and diabetes.  He understands and very much wants to proceed with surgical intervention.    LOS: 1 day    Mariella Saa 10/22/2017

## 2017-10-23 ENCOUNTER — Encounter (HOSPITAL_COMMUNITY): Payer: Self-pay | Admitting: General Surgery

## 2017-10-23 ENCOUNTER — Inpatient Hospital Stay (HOSPITAL_COMMUNITY): Payer: Self-pay

## 2017-10-23 DIAGNOSIS — K56609 Unspecified intestinal obstruction, unspecified as to partial versus complete obstruction: Secondary | ICD-10-CM

## 2017-10-23 DIAGNOSIS — I509 Heart failure, unspecified: Secondary | ICD-10-CM

## 2017-10-23 LAB — BASIC METABOLIC PANEL
ANION GAP: 7 (ref 5–15)
BUN: 29 mg/dL — ABNORMAL HIGH (ref 6–20)
CO2: 28 mmol/L (ref 22–32)
Calcium: 7.9 mg/dL — ABNORMAL LOW (ref 8.9–10.3)
Chloride: 107 mmol/L (ref 98–111)
Creatinine, Ser: 1.31 mg/dL — ABNORMAL HIGH (ref 0.61–1.24)
GLUCOSE: 108 mg/dL — AB (ref 70–99)
POTASSIUM: 4.1 mmol/L (ref 3.5–5.1)
SODIUM: 142 mmol/L (ref 135–145)

## 2017-10-23 LAB — GLUCOSE, CAPILLARY
GLUCOSE-CAPILLARY: 86 mg/dL (ref 70–99)
GLUCOSE-CAPILLARY: 102 mg/dL — AB (ref 70–99)
Glucose-Capillary: 88 mg/dL (ref 70–99)
Glucose-Capillary: 89 mg/dL (ref 70–99)
Glucose-Capillary: 99 mg/dL (ref 70–99)

## 2017-10-23 LAB — CBC
HCT: 32.7 % — ABNORMAL LOW (ref 39.0–52.0)
Hemoglobin: 10.2 g/dL — ABNORMAL LOW (ref 13.0–17.0)
MCH: 28.9 pg (ref 26.0–34.0)
MCHC: 31.2 g/dL (ref 30.0–36.0)
MCV: 92.6 fL (ref 78.0–100.0)
PLATELETS: 244 10*3/uL (ref 150–400)
RBC: 3.53 MIL/uL — ABNORMAL LOW (ref 4.22–5.81)
RDW: 14 % (ref 11.5–15.5)
WBC: 10.1 10*3/uL (ref 4.0–10.5)

## 2017-10-23 NOTE — Progress Notes (Signed)
Patient ID: Jeremiah Vance, male   DOB: 1970-11-14, 47 y.o.   MRN: [id number]    1 Day Post-Op  Subjective: Pt having a lot of pain today.  Abdominal binder seems too tight. Trying to void, but having trouble in chair.  Did void last night about 600cc.  No flatus.  Objective: Vital signs in last 24 hours: Temp:  [97.9 F (36.6 C)-99.3 F (37.4 C)] 98.6 F (37 C) (07/25 1011) Pulse Rate:  [62-78] 62 (07/25 1011) Resp:  [12-22] 18 (07/25 1011) BP: (129-165)/(56-74) 165/69 (07/25 1011) SpO2:  [94 %-100 %] 100 % (07/25 1011) Last BM Date: 10/21/17  Intake/Output from previous day: 07/24 0701 - 07/25 0700 In: 5795.8 [I.V.:2904.4; NG/GT:5; IV Piggyback:2886.4] Out: 1380 [Urine:1050; Drains:130; Blood:200] Intake/Output this shift: Total I/O In: -  Out: 100 [Urine:100]  PE: Heart: regular Lungs: working a bit to breathing, decrease BS at bases, otherwise clear Abd: soft, morbidly obese, abdominal binder too high and too small.  Hypoactive BS, incisions c/d/i.  JP with serosang output. Ext: some edema in his BLE  Lab Results:  Recent Labs    10/22/17 0550 10/23/17 0327  WBC 7.9 10.1  HGB 10.6* 10.2*  HCT 32.9* 32.7*  PLT 254 244   BMET Recent Labs    10/22/17 0550 10/23/17 0327  NA 142 142  K 3.9 4.1  CL 106 107  CO2 28 28  GLUCOSE 121* 108*  BUN 38* 29*  CREATININE 1.60* 1.31*  CALCIUM 8.2* 7.9*   PT/INR No results for input(s): LABPROT, INR in the last 72 hours. CMP     Component Value Date/Time   NA 142 10/23/2017 0327   NA 140 10/14/2017 1110   K 4.1 10/23/2017 0327   CL 107 10/23/2017 0327   CO2 28 10/23/2017 0327   GLUCOSE 108 (H) 10/23/2017 0327   BUN 29 (H) 10/23/2017 0327   BUN 26 (H) 10/14/2017 1110   CREATININE 1.31 (H) 10/23/2017 0327   CALCIUM 7.9 (L) 10/23/2017 0327   PROT 7.4 10/21/2017 1207   ALBUMIN 3.5 10/21/2017 1207   AST 28 10/21/2017 1207   ALT 30 10/21/2017 1207   ALKPHOS 138 (H) 10/21/2017 1207   BILITOT 2.1 (H) 10/21/2017  1207   GFRNONAA >60 10/23/2017 0327   GFRAA >60 10/23/2017 0327   Lipase     Component Value Date/Time   LIPASE 52 (H) 10/21/2017 1207       Studies/Results: Ct Abdomen Pelvis Wo Contrast  Result Date: 10/21/2017 CLINICAL DATA:  Abdominal pain with appendicitis suspected. Noncontrast due to elevated creatinine. EXAM: CT ABDOMEN AND PELVIS WITHOUT CONTRAST TECHNIQUE: Multidetector CT imaging of the abdomen and pelvis was performed following the standard protocol without IV contrast. COMPARISON:  07/10/2017 FINDINGS: Lower chest: 5 mm and smaller pulmonary nodules in the right middle lobe and right lower lobe, stable from 2010 and benign. Hepatobiliary: Mild lobulation of the liver surface without definite cirrhosis.Cholelithiasis. No evidence of cholecystitis. Pancreas: Unremarkable. Spleen: Unremarkable. Adrenals/Urinary Tract: Negative adrenals. No hydronephrosis or ureteral stone. Small renal hilar calcifications appear vascular. Unremarkable bladder. Stomach/Bowel: Dilated and fluid-filled small bowel with transition point within a low midline hernia sac. Negative for bowel perforation or pneumatosis. No appendicitis. Vascular/Lymphatic: No acute vascular abnormality. Atherosclerotic calcification. No mass or adenopathy. Reproductive:Negative. Other: No ascites or pneumoperitoneum. Musculoskeletal: No acute abnormalities. Remote T12 compression fracture. IMPRESSION: 1. Small-bowel obstruction due to midline hernia. 2. Cholelithiasis. Electronically Signed   By: Marnee Spring M.D.   On: 10/21/2017 18:51  Dg Abd Portable 1 View  Result Date: 10/21/2017 CLINICAL DATA:  Evaluate nasogastric tube placement. EXAM: PORTABLE ABDOMEN - 1 VIEW COMPARISON:  CT 10/21/2017 FINDINGS: Single view of the abdomen demonstrates a nasogastric tube within the left upper abdomen. The catheter tip is likely in the gastric fundus region. Again noted are dilated loops of small bowel. IMPRESSION: Nasogastric tube  tip in the gastric fundus region. Electronically Signed   By: Richarda Overlie M.D.   On: 10/21/2017 20:27    Anti-infectives: Anti-infectives (From admission, onward)   Start     Dose/Rate Route Frequency Ordered Stop   10/22/17 0930  ceFAZolin (ANCEF) 3 g in dextrose 5 % 50 mL IVPB  Status:  Discontinued     3 g 100 mL/hr over 30 Minutes Intravenous To Short Stay 10/22/17 0926 10/22/17 1436   10/22/17 0830  ceFAZolin (ANCEF) powder 3 g     3 g Other To Surgery 10/22/17 4098 10/22/17 1040       Assessment/Plan Incarcerated ventral hernia with SBO, POD 1, s/p open ventral hernia repair with mesh, retrorectus -cont NGT today, no bowel function yet.  No NGT documented, but with about 500-600cc in cannister. -mobilize and pulm toilet -abdominal binder is too small for the patient. May need to try and attach a second one to see If this works better. CKD - asked medicine to assist.  Likely needs diuretics back.  Cr improved today to 1.3, but only made 200cc of concentrated urine since overnight. CHF - ask medicine to see for assistance.  PCXR pending to eval for fluid overload CVA - just had CVA in June.  Does not appear to be on plavix or any other thinners currently HTN - on lopressor currently while NPO  FEN - NGT/IVFs VTE - Lovenox 60mg /SCDs ID - none   LOS: 2 days    Letha Cape , [facility] 10/23/2017, 10:14 AM Pager: [phone]

## 2017-10-23 NOTE — Consult Note (Signed)
Family Medicine Teaching Service Consult Note Service Pager: [phone]  Patient name: Jeremiah Vance Medical record number: [id number] Date of birth: 04-19-1970 Age: 47 y.o. Gender: male  Primary Care Provider: Wendee Beavers, DO Consultants: Family Medicine Code Status:  FULL   Chief Complaint: fluid overload  Assessment and Plan: Jeremiah Vance is a 47 y.o. male presenting with nausea, vomiting found to have SBO due to a dilated and fluid-midline hernia.  Now POD #1 with fluid overload. Family Medicine Teaching Service consulted to evaluate.  PMH is significant for morbid obesity, NIDDM, h/o prior stroke, CHF, HTN, HLD, gout and depression.    Fluid overload Family medicine consulted for evaluation and management of CHF and AKI. Last echo 08/2017, EF 60-65%.  At home on Torsemide 40 mg daily. He notes the last time this was taken was Monday prior to being hospitalized on Tuesday.    Blood pressures stable with systolics in the 120-130 range.   Lung exam without crackles concerning for CHF although he does have trace nonpitting edema of bilateral lower extremities which began today.  No increased work of breathing noted, no chest pain or palpitations.   Fluids running at 100 cc/hr.  CXR ordered by primary team indicates progressive RLL ateletasis/infiltrate without evidence of heart failure.  Will hold off on IV lasix for now, and monitor closely.  - recommend restart home diuretics - reassess volume status later today  - vitals per unit routine  Acute kidney injury  SCr 1.3 improved from 1.60 yesterday.  Per chart review, baseline appears to be 1.0-1.2.  UOP 1L on 7/24, only 200 cc overnight and into today.   Upon further discussion with nurse and he has made additional 150 cc which had not been charted yet.  - obtain bladder scan to determine postvoid residual - consider foley if retaining - strict I/O - continue to monitor   S/p incarcerated ventral incisional hernia repair with  mesh POD #1 today, following an incarcerated recurrent ventral incisional hernia.  Uncomplicated surgery, pt hemodynamically stable post-op.  Per surgery note, pt with lot of pain today, abdominal binder in place.  Voided 600 cc last night, no flatus yet.    - management per gen surg, primary   FEN/GI: NPO with ice chips, IV NS @100  cc/hr  Prophylaxis: lovenox, SCDs  Disposition: pending clinical improvement   Review Of Systems: Per HPI with the following additions:   Review of Systems  Constitutional: Negative for chills and fever.  HENT: Negative for congestion.   Respiratory: Negative for cough, sputum production, shortness of breath and wheezing.   Cardiovascular: Positive for leg swelling. Negative for chest pain and palpitations.  Skin: Negative for rash.   Patient Active Problem List   Diagnosis Date Noted  . Hernia of abdominal cavity 10/21/2017  . Depression 10/14/2017  . Adjustment disorder with depressed mood 09/30/2017  . Stroke (HCC) 09/14/2017  . Acute right-sided weakness   . AKI (acute kidney injury) (HCC)   . Congestive heart failure (HCC)   . Hyperlipidemia   . Stroke (cerebrum) (HCC) 09/07/2017  . Gout   . Acute ischemic stroke (HCC) 09/06/2017  . Anasarca   . Elevated LFTs   . Acute systolic HF (heart failure) (HCC)   . Obesity, Class III, BMI 40-49.9 (morbid obesity) (HCC)   . Hypoalbuminemia 07/11/2017  . Tinea corporis 07/11/2017  . Hyperbilirubinemia 07/10/2017  . Anemia 07/10/2017  . Type 2 diabetes mellitus with complication, without long-term current use of insulin (  HCC) 07/10/2017  . Hypertension 07/10/2017   Past Medical History: Past Medical History:  Diagnosis Date  . CHF (congestive heart failure) (HCC)    "coinsided w/kidney problems I was having 06/2017"  . CKD (chronic kidney disease) stage 3, GFR 30-59 ml/min (HCC) 06/2017  . High cholesterol   . History of cardiomyopathy    LVEF 40 to 45% in April 2019 - subsequently normalized   . Hypertension   . Ischemic stroke (HCC)    Small left internal capsule infarct due to lacunar disease  . Morbid obesity (HCC)   . Stroke (HCC) 08/2017   "right sided weakness since; getting stronger though" (10/22/2017)  . Type 2 diabetes mellitus (HCC)    Past Surgical History: Past Surgical History:  Procedure Laterality Date  . ABDOMINAL HERNIA REPAIR  2008; 10/22/2017   "scope; OPEN REPAIR INCARCERATED VENTRAL HERNIA  . HERNIA REPAIR    . KNEE ARTHROSCOPY Right 1989  . VENTRAL HERNIA REPAIR N/A 10/22/2017   Procedure: OPEN REPAIR INCARCERATED VENTRAL HERNIA;  Surgeon: Glenna Fellows, MD;  Location: [facility] OR;  Service: General;  Laterality: N/A;   Social History: Social History   Tobacco Use  . Smoking status: Never Smoker  . Smokeless tobacco: Never Used  Substance Use Topics  . Alcohol use: Not Currently    Frequency: Never    Comment: 10/22/2017 "sober since 2000"  . Drug use: Never   Family History: Family History  Problem Relation Age of Onset  . Diabetes Father   . Heart disease Father   . Diabetes Sister   . Diabetes Mother   . Stroke Mother   . Diabetes Maternal Grandmother   . Heart disease Maternal Grandmother   . Stroke Paternal Grandfather   . Heart disease Paternal Grandfather    Allergies and Medications: No Known Allergies No current facility-administered medications on file prior to encounter.    Current Outpatient Medications on File Prior to Encounter  Medication Sig Dispense Refill  . acetaminophen (TYLENOL) 500 MG tablet Take 500 mg by mouth every 6 (six) hours as needed for mild pain or moderate pain.    [name] Kitchen aspirin 81 MG chewable tablet Chew 1 tablet (81 mg total) by mouth daily. 30 tablet 0  . atorvastatin (LIPITOR) 40 MG tablet Take 1 tablet (40 mg total) by mouth daily at 6 PM. 90 tablet 3  . carvedilol (COREG) 6.25 MG tablet Take 1 tablet (6.25 mg total) by mouth 2 (two) times daily. 60 tablet 3  . lisinopril (PRINIVIL,ZESTRIL) 20 MG  tablet Take 0.5 tablets (10 mg total) by mouth daily. 30 tablet 3  . metFORMIN (GLUCOPHAGE) 500 MG tablet Take 1 tablet (500 mg total) by mouth 2 (two) times daily with a meal. 60 tablet 2  . torsemide (DEMADEX) 20 MG tablet Take 2 tablets (40 mg total) by mouth daily. 60 tablet 4  . spironolactone (ALDACTONE) 25 MG tablet Take 1 tablet (25 mg total) by mouth daily. (Patient not taking: Reported on 10/14/2017) 30 tablet 3   Objective: BP (!) 165/69 (BP Location: Right Arm)   Pulse 62   Temp 98.6 F (37 C) (Oral)   Resp 18   Ht 5\' 11"  (1.803 m)   Wt 265 lb 14 oz (120.6 kg)   SpO2 100%   BMI 37.08 kg/m  Exam: General: pleasant 47 yo male, NAD  Eyes: PERRL, EOMI ENTM: MMM, o/p clear, NGT in place  Neck: supple Cardiovascular: RRR no MRG Respiratory: CTAB, normal effort  Gastrointestinal: soft,  obese, abdominal binder in place, hypoactive bs, incision is c/d/i, JP with serosanguinous output Derm: warm, dry, no rash Extremities: trace edema in bilateral LE Neuro: alert, oriented x3, no focal deficits Psych: normal mood and affect  Labs and Imaging: CBC BMET  Recent Labs  Lab 10/23/17 0327  WBC 10.1  HGB 10.2*  HCT 32.7*  PLT 244   Recent Labs  Lab 10/23/17 0327  NA 142  K 4.1  CL 107  CO2 28  BUN 29*  CREATININE 1.31*  GLUCOSE 108*  CALCIUM 7.9*     Dg Chest Port 1 View  Result Date: 10/23/2017 CLINICAL DATA:  CHF EXAM: PORTABLE CHEST 1 VIEW COMPARISON:  09/06/2017 FINDINGS: Cardiac enlargement. Vascularity normal. Negative for heart failure. Elevated right hemidiaphragm with progressive right lower lobe airspace disease. Negative for pleural effusion NG in the stomach IMPRESSION: Negative for heart failure Progressive right lower lobe atelectasis/infiltrate. Electronically Signed   By: Marlan Palau M.D.   On: 10/23/2017 10:19    Freddrick March, MD 10/23/2017, 12:00 PM PGY-3, [facility] FPTS Intern pager: [phone], text pages welcome

## 2017-10-23 NOTE — Progress Notes (Signed)
Initial Nutrition Assessment  DOCUMENTATION CODES:   Obesity unspecified  INTERVENTION:   -RD will follow for diet advancement and supplement as appropriate  NUTRITION DIAGNOSIS:   Inadequate oral intake related to altered GI function as evidenced by NPO status.  GOAL:   Patient will meet greater than or equal to 90% of their needs  MONITOR:   Diet advancement, Labs, Weight trends, Skin, I & O's  REASON FOR ASSESSMENT:   Malnutrition Screening Tool    ASSESSMENT:   47 year old male with a past medical history significant for hypertension, diabetes, history of stroke, morbid obesity, who states he began having abdominal pain, nausea and vomiting last night approximately 8 PM.  He did state that he noticed this hernia was bulging out and was not reducible.  Patient states that this continued overnight.  He states he had minimal sleep.  Patient did state that he had some diarrhea up until 4 AM this morning.  Secondary to continued abdominal pain, nausea vomiting he presented to the ER for further evaluation and management.  Pt admitted with incarcerated ventral hernia causing SBO.   7/23- NGT placed 7/24- s/p repair of incarcerated recurrent ventral incisional hernia with mesh, retrorectus  Reviewed I/O's: +4.4 L x 24 hours and +5.6 since admission.   Noted 5 ml NGT output within the past 24 hours per doc flowsheets  Pt sleeping soundly at time of visit. Unable to obtain further nutrition hx at this time.   Reviewed wt hx; noted pt has experienced a 6.7% wt loss over the past 3 months, however, this is not significant for time frame.   Labs reviewed: CBGS WDL (inpatient orders for glycemic control are 0-20 units insulin aspart TID).   NUTRITION - FOCUSED PHYSICAL EXAM:    Most Recent Value  Orbital Region  No depletion  Upper Arm Region  No depletion  Thoracic and Lumbar Region  No depletion  Buccal Region  No depletion  Temple Region  No depletion  Clavicle Bone  Region  No depletion  Clavicle and Acromion Bone Region  No depletion  Scapular Bone Region  No depletion  Dorsal Hand  No depletion  Patellar Region  No depletion  Anterior Thigh Region  No depletion  Posterior Calf Region  No depletion  Edema (RD Assessment)  Mild  Hair  Reviewed  Eyes  Reviewed  Mouth  Reviewed  Skin  Reviewed  Nails  Reviewed       Diet Order:   Diet Order           Diet NPO time specified Except for: Ice Chips  Diet effective now          EDUCATION NEEDS:   No education needs have been identified at this time  Skin:  Skin Assessment: Skin Integrity Issues: Skin Integrity Issues:: Incisions Incisions: closed abdomen  Last BM:  10/21/17  Height:   Ht Readings from Last 1 Encounters:  10/22/17 5\' 11"  (1.803 m)    Weight:   Wt Readings from Last 1 Encounters:  10/22/17 265 lb 14 oz (120.6 kg)    Ideal Body Weight:  78.2 kg  BMI:  Body mass index is 37.08 kg/m.  Estimated Nutritional Needs:   Kcal:  1950-2150  Protein:  115-130 grams  Fluid:  1.9-2.1 L    Joda Braatz A. Mayford Knife, RD, LDN, CDE Pager: [phone] After hours Pager: [phone]

## 2017-10-23 NOTE — Progress Notes (Signed)
Bladder scanned with 75cc noted on scanner

## 2017-10-24 DIAGNOSIS — Z8719 Personal history of other diseases of the digestive system: Secondary | ICD-10-CM

## 2017-10-24 DIAGNOSIS — I5032 Chronic diastolic (congestive) heart failure: Secondary | ICD-10-CM

## 2017-10-24 LAB — BASIC METABOLIC PANEL
Anion gap: 6 (ref 5–15)
BUN: 32 mg/dL — ABNORMAL HIGH (ref 6–20)
CALCIUM: 8.1 mg/dL — AB (ref 8.9–10.3)
CO2: 29 mmol/L (ref 22–32)
CREATININE: 1.01 mg/dL (ref 0.61–1.24)
Chloride: 110 mmol/L (ref 98–111)
GFR calc non Af Amer: >60 mL/min (ref >60)
GLUCOSE: 119 mg/dL — AB (ref 70–99)
Potassium: 4.2 mmol/L (ref 3.5–5.1)
Sodium: 145 mmol/L (ref 135–145)

## 2017-10-24 LAB — GLUCOSE, CAPILLARY
Glucose-Capillary: 106 mg/dL — ABNORMAL HIGH (ref 70–99)
Glucose-Capillary: 112 mg/dL — ABNORMAL HIGH (ref 70–99)
Glucose-Capillary: 101 mg/dL — ABNORMAL HIGH (ref 70–99)
Glucose-Capillary: 97 mg/dL (ref 70–99)
Glucose-Capillary: 75 mg/dL (ref 70–99)

## 2017-10-24 MED ORDER — PHENOL 1.4 % MT LIQD
1.0000 | OROMUCOSAL | Status: DC | PRN
  Administered 2017-10-24: 1 via OROMUCOSAL
  Filled 2017-10-24: qty 177

## 2017-10-24 NOTE — Progress Notes (Signed)
2 Days Post-Op   Subjective/Chief Complaint: Pt doing well this AM passign some flatus   Objective: Vital signs in last 24 hours: Temp:  [98.2 F (36.8 C)-98.6 F (37 C)] 98.6 F (37 C) (07/26 0439) Pulse Rate:  [61-90] 90 (07/26 0439) Resp:  [18-20] 20 (07/26 0439) BP: (146-165)/(68-80) 157/80 (07/26 0439) SpO2:  [90 %-100 %] 90 % (07/26 0439) Weight:  [121.1 kg (266 lb 15.6 oz)] 121.1 kg (266 lb 15.6 oz) (07/26 0439) Last BM Date: 10/21/17  Intake/Output from previous day: 07/25 0701 - 07/26 0700 In: 1029.7 [I.V.:1029.7] Out: 3115 [Urine:2550; Emesis/NG output:500; Drains:65] Intake/Output this shift: No intake/output data recorded.  General appearance: alert and cooperative GI: soft approp ttp, nd, inc c/d/i, JP SS  Lab Results:  Recent Labs    10/22/17 0550 10/23/17 0327  WBC 7.9 10.1  HGB 10.6* 10.2*  HCT 32.9* 32.7*  PLT 254 244   BMET Recent Labs    10/23/17 0327 10/24/17 0454  NA 142 145  K 4.1 4.2  CL 107 110  CO2 28 29  GLUCOSE 108* 119*  BUN 29* 32*  CREATININE 1.31* 1.01  CALCIUM 7.9* 8.1*   PT/INR No results for input(s): LABPROT, INR in the last 72 hours. ABG No results for input(s): PHART, HCO3 in the last 72 hours.  Invalid input(s): PCO2, PO2  Studies/Results: Dg Chest Port 1 View  Result Date: 10/23/2017 CLINICAL DATA:  CHF EXAM: PORTABLE CHEST 1 VIEW COMPARISON:  09/06/2017 FINDINGS: Cardiac enlargement. Vascularity normal. Negative for heart failure. Elevated right hemidiaphragm with progressive right lower lobe airspace disease. Negative for pleural effusion NG in the stomach IMPRESSION: Negative for heart failure Progressive right lower lobe atelectasis/infiltrate. Electronically Signed   By: Marlan Palau M.D.   On: 10/23/2017 10:19    Anti-infectives: Anti-infectives (From admission, onward)   Start     Dose/Rate Route Frequency Ordered Stop   10/22/17 0930  ceFAZolin (ANCEF) 3 g in dextrose 5 % 50 mL IVPB  Status:   Discontinued     3 g 100 mL/hr over 30 Minutes Intravenous To Short Stay 10/22/17 0926 10/22/17 1436   10/22/17 0830  ceFAZolin (ANCEF) powder 3 g     3 g Other To Surgery 10/22/17 1115 10/22/17 1040      Assessment/Plan:  Incarcerated ventral hernia with SBO, POD 2 s/p open ventral hernia repair with mesh, retrorectus -clamp NGT today, passing some flatus. -mobilize and pulm toilet  CKD - Cr better today, IM following CHF - ask medicine to see for assistance.  PCXR pending to eval for fluid overload CVA - just had CVA in June.  Does not appear to be on plavix or any other thinners currently HTN - on lopressor currently while NPO  FEN - NGT/IVFs VTE - Lovenox 60mg /SCDs ID - none    LOS: 3 days    Marigene Ehlers., Jed Limerick 10/24/2017

## 2017-10-24 NOTE — Progress Notes (Signed)
Family Medicine Teaching Service Daily Progress Note Intern Pager: [phone]  Patient name: Jeremiah Vance Medical record number: [id number] Date of birth: 12-Sep-1970 Age: 47 y.o. Gender: male  Primary Care Provider: Wendee Beavers, DO Consultants: FPTS Code Status: Full  Pt Overview and Major Events to Date:  7/24 - ventral hernia repair  Assessment and Plan: Jeremiah Vance is a 47 y.o. male presenting with nausea, vomiting found to have SBO due to a dilated and fluid-midline hernia.  Now POD #1 with fluid overload. Family Medicine Teaching Service consulted to evaluate.  PMH is significant for morbid obesity, NIDDM, h/o prior stroke, CHF, HTN, HLD, gout and depression.    CHF - Stable Not suspicious for ADHF at this time.  3.1 L OUP (7/26).  Patient denied SOB, crackles are not appreciated on exam, trace LE edema at ankles (baseline per patient), JVD at angle of jaw, his membranes. Home meds: coreg 6.25 mg, lisinopril 20 mg, spironolactone 25, torsemide 20 mg. - restart home diuretics (torsemide 40 mg) when PO is tolerated - continue to assess volume status - monitor vitals  AKI -resolved BL Cr: 1.0-1.2, 1.6 (7/24), 1.01 (7/26) 3.1 L OUP(7/25) - strick I/Os - monitor BMP  HTN - poorly controlled Systolics overnight between 146 and 165.  IV metoprolol 2.5 given yesterday. -Consider increasing IV metoprolol to 5 - Restart home meds when PO intake is possible  T2DM - stable BG well controlled at this time. - continue rSSI  Recent CVA - stable CVA on 09/14/2017. No symptoms/complaints at this time. -Start home ASA, administer rectally if no PO today  HDL - recent CVA -restart home atorvastatin 40 mg when PO intake is possible  S/P ventral hernia repair for incarcerated hernia POD#2, pt without nausea vomiting.  Patient is passing flatus, no bowel movements yet.  Hemoglobin stable.  pt was told he may get his NG tube out today. - continue management per primary - Progress  diet per primary  FEN/GI: NPO, ice chips PPx: lovenox, SCDs  Disposition: Per primary surgical team.  Recommendations are listed above, residents are unable to put in orders on this patient.  Subjective:  Spoke with patient at bedside this morning.  Patient felt comfortable so that he thought he was improving well.  Denies shortness of breath.  Patient continues to feel some incisional pain from the surgery that is well controlled with his pain medication.  No nausea, vomiting.  Passing flatus, no bowel movements yet.  Patient reports that he has had a heart failure exacerbation in the past and presently does not feel any similar issues or complaints.  Objective: Temp:  [98.2 F (36.8 C)-98.6 F (37 C)] 98.6 F (37 C) (07/26 0439) Pulse Rate:  [61-90] 90 (07/26 0439) Resp:  [18-20] 20 (07/26 0439) BP: (146-165)/(68-80) 157/80 (07/26 0439) SpO2:  [90 %-100 %] 90 % (07/26 0439) Weight:  [266 lb 15.6 oz (121.1 kg)] 266 lb 15.6 oz (121.1 kg) (07/26 0439) Physical Exam: General: Alert and cooperative and appears to be in no acute distress HEENT: JVD to angle of the jaw. Neck non-tender without lymphadenopathy, masses or thyromegaly Cardio: Normal A1 and S2, no S3 or S4. RRR. No murmurs or rubs.   Pulm: Clear to auscultation bilaterally, no crackles, wheezing, or diminished breath sounds. Normal respiratory effort Abdomen: Bowel sounds normal. Abdomen soft and non-tender.  Extremities: Trace peripheral edema at ankles. Warm/ well perfused.  Strong radial pulses. Neuro: Cranial nerves grossly intact   Laboratory: Recent Labs  Lab 10/21/17 1207 10/22/17 0550 10/23/17 0327  WBC 12.4* 7.9 10.1  HGB 13.1 10.6* 10.2*  HCT 40.3 32.9* 32.7*  PLT 334 254 244   Recent Labs  Lab 10/21/17 1207 10/22/17 0550 10/23/17 0327 10/24/17 0454  NA 139 142 142 145  K 4.1 3.9 4.1 4.2  CL 100 106 107 110  CO2 27 28 28 29   BUN 39* 38* 29* 32*  CREATININE 1.84* 1.60* 1.31* 1.01  CALCIUM 9.2 8.2*  7.9* 8.1*  PROT 7.4  --   --   --   BILITOT 2.1*  --   --   --   ALKPHOS 138*  --   --   --   ALT 30  --   --   --   AST 28  --   --   --   GLUCOSE 154* 121* 108* 119*    Imaging/Diagnostic Tests: Dg Chest Port 1 View  Result Date: 10/23/2017 CLINICAL DATA:  CHF EXAM: PORTABLE CHEST 1 VIEW COMPARISON:  09/06/2017 FINDINGS: Cardiac enlargement. Vascularity normal. Negative for heart failure. Elevated right hemidiaphragm with progressive right lower lobe airspace disease. Negative for pleural effusion NG in the stomach IMPRESSION: Negative for heart failure Progressive right lower lobe atelectasis/infiltrate. Electronically Signed   By: Marlan Palau M.D.   On: 10/23/2017 10:19     Mirian Mo, MD 10/24/2017, 6:35 AM PGY-1, [facility] FPTS Intern pager: [phone], text pages welcome

## 2017-10-24 NOTE — Progress Notes (Signed)
NGT removed this pm at 3pm as instructed by MD  after pt denied nausea since clamping this morning. Pt passing gas and took a few steps around the room, requires a lot of encouragement to ambulate but is rather reluctant to mobilize. Currently sitting up in chair

## 2017-10-25 LAB — BASIC METABOLIC PANEL
ANION GAP: 6 (ref 5–15)
BUN: 25 mg/dL — ABNORMAL HIGH (ref 6–20)
CHLORIDE: 110 mmol/L (ref 98–111)
CO2: 25 mmol/L (ref 22–32)
Calcium: 8 mg/dL — ABNORMAL LOW (ref 8.9–10.3)
Creatinine, Ser: 0.98 mg/dL (ref 0.61–1.24)
GFR calc non Af Amer: >60 mL/min (ref >60)
Glucose, Bld: 83 mg/dL (ref 70–99)
POTASSIUM: 3.7 mmol/L (ref 3.5–5.1)
SODIUM: 141 mmol/L (ref 135–145)

## 2017-10-25 LAB — GLUCOSE, CAPILLARY
GLUCOSE-CAPILLARY: 100 mg/dL — AB (ref 70–99)
GLUCOSE-CAPILLARY: 115 mg/dL — AB (ref 70–99)
GLUCOSE-CAPILLARY: 67 mg/dL — AB (ref 70–99)
GLUCOSE-CAPILLARY: 75 mg/dL (ref 70–99)
GLUCOSE-CAPILLARY: 89 mg/dL (ref 70–99)
Glucose-Capillary: 93 mg/dL (ref 70–99)
Glucose-Capillary: 76 mg/dL (ref 70–99)

## 2017-10-25 MED ORDER — FUROSEMIDE 10 MG/ML IJ SOLN: 40.0000 mg | Freq: Once | INTRAMUSCULAR | Status: DC

## 2017-10-25 MED ORDER — FUROSEMIDE 10 MG/ML IJ SOLN
40.0000 mg | Freq: Once | INTRAMUSCULAR | Status: AC
  Administered 2017-10-25: 40 mg via INTRAVENOUS
  Filled 2017-10-25: qty 4

## 2017-10-25 MED ORDER — DEXTROSE 50 % IV SOLN
25.0000 mL | Freq: Once | INTRAVENOUS | Status: AC
  Administered 2017-10-25: 25 mL via INTRAVENOUS
  Filled 2017-10-25: qty 50

## 2017-10-25 MED ORDER — ASPIRIN EC 81 MG PO TBEC
81.0000 mg | DELAYED_RELEASE_TABLET | Freq: Every day | ORAL | Status: DC
  Administered 2017-10-25 – 2017-10-28 (×4): 81 mg via ORAL
  Filled 2017-10-25 (×4): qty 1

## 2017-10-25 NOTE — Progress Notes (Signed)
Family Medicine Teaching Service Daily Progress Note Intern Pager: [phone]  Patient name: Jeremiah Vance Medical record number: [id number] Date of birth: 1970-07-08 Age: 47 y.o. Gender: male  Primary Care Provider: Wendee Beavers, DO Consultants: FPTS Code Status: Full  Pt Overview and Major Events to Date:  7/24 - ventral hernia repair  Assessment and Plan: Jeremiah Vance is a 47 y.o. male presenting with nausea, vomiting found to have SBO due to a dilated and fluid-midline hernia.  Now POD #1 with fluid overload. Family Medicine Teaching Service consulted to evaluate.  PMH is significant for morbid obesity, NIDDM, h/o prior stroke, CHF, HTN, HLD, gout and depression.    CHF - unstable Trace edema above SCDs, JVD up to angle of the jaw, no crackles on pulm exam, denies SOB. Pt appears to be fluid up 7/26. Pt appears to increasingly fluid overloaded. Home meds: coreg 6.25 mg, lisinopril 20 mg, spironolactone 25, torsemide 20 mg. - check Cr today and one dose IV lasix 40 mg - restart home coreg 6.25 this evening (7/27 pm) - continue to assess volume status - monitor vitals  AKI -resolved BL Cr: 1.0-1.2, 1.6 (7/24), 0.31 L OUP(7/26) no Cr drawn on 7/27 -draw BMP to monitor K and Cr. - strick I/Os - monitor BMP  HTN - poorly controlled Systolics overnight between 156 and 164.  - restart Coreg (7/27) - potentially restart lisinopril and spironolactone 7/28  T2DM - low BG overnight BG down to 67 overnight, D50 given. No insulin required in past 24 hrs, pt NPO at this time. - continue rSSI  Recent CVA - stable CVA on 09/14/2017. No symptoms/complaints at this time. -Start home ASA  HDL - recent CVA -restart home atorvastatin 40   S/P ventral hernia repair for incarcerated hernia POD#3, no nausea/vomiting post NG removal, positive flatus, no BM yet. - continue management per primary - Progress diet per primary  FEN/GI: NPO, ice chips PPx: lovenox,  SCDs  Disposition: Per primary surgical team.  Recommendations are listed above, residents are unable to put in orders on this patient.  Subjective:  Pt has no new complaints this am except that he would like to resume his normal diet.  Pt denied SOB/nausea/vomiting/chest pain/headache/new focal deficits. Pt noted incisional pain from surgery that is well controlled on current pain regimen.  Objective: Temp:  [98.6 F (37 C)-98.7 F (37.1 C)] 98.7 F (37.1 C) (07/27 0454) Pulse Rate:  [59-73] 59 (07/27 0454) Resp:  [16-18] 16 (07/27 0454) BP: (156-174)/(73-75) 156/73 (07/27 0454) SpO2:  [97 %-99 %] 99 % (07/27 0454) Physical Exam: General: Alert and cooperative and appears to be in no acute distress HEENT: JVD to angle of jaw. Neck non-tender without lymphadenopathy, masses or thyromegaly Cardio: Normal A1 and S2, no S3 or S4. RRR. No murmurs or rubs.   Pulm: Clear to auscultation bilaterally, no crackles, wheezing, or diminished breath sounds. Normal respiratory effort Abdomen: Bowel sounds normal. Abdomen soft and non-tender.  Extremities: Trace LE edema above SCDs. Warm/ well perfused.  Strong radial pulses. Neuro: Cranial nerves grossly intact     Laboratory: Recent Labs  Lab 10/21/17 1207 10/22/17 0550 10/23/17 0327  WBC 12.4* 7.9 10.1  HGB 13.1 10.6* 10.2*  HCT 40.3 32.9* 32.7*  PLT 334 254 244   Recent Labs  Lab 10/21/17 1207 10/22/17 0550 10/23/17 0327 10/24/17 0454  NA 139 142 142 145  K 4.1 3.9 4.1 4.2  CL 100 106 107 110  CO2 27 28  28 29  BUN 39* 38* 29* 32*  CREATININE 1.84* 1.60* 1.31* 1.01  CALCIUM 9.2 8.2* 7.9* 8.1*  PROT 7.4  --   --   --   BILITOT 2.1*  --   --   --   ALKPHOS 138*  --   --   --   ALT 30  --   --   --   AST 28  --   --   --   GLUCOSE 154* 121* 108* 119*    Imaging/Diagnostic Tests: No results found.   Mirian Mo, MD 10/25/2017, 6:33 AM PGY-1, [facility] FPTS Intern pager: [phone], text pages  welcome

## 2017-10-25 NOTE — Progress Notes (Signed)
Patient ID: Jeremiah Vance, male   DOB: [birth year], 47 y.o.   MRN: [id number] [facility] Progress Note:   3 Days Post-Op  Subjective: Mental status is alert;  Passing flatus Objective: Vital signs in last 24 hours: Temp:  [98.6 F (37 C)-98.7 F (37.1 C)] 98.7 F (37.1 C) (07/27 0454) Pulse Rate:  [59-73] 59 (07/27 0454) Resp:  [16-18] 16 (07/27 0454) BP: (156-174)/(73-75) 156/73 (07/27 0454) SpO2:  [97 %-99 %] 99 % (07/27 0454)  Intake/Output from previous day: 07/26 0701 - 07/27 0700 In: 1132.6 [I.V.:1132.6] Out: 330 [Urine:300; Drains:30] Intake/Output this shift: Total I/O In: -  Out: 200 [Urine:200]  Physical Exam: Work of breathing is normal.  Honeycomb on stapled incision with JP bloody  + flatus per patient  Lab Results:  Results for orders placed or performed during the hospital encounter of 10/21/17 (from the past 48 hour(s))  Glucose, capillary     Status: None   Collection Time: 10/23/17 11:41 AM  Result Value Ref Range   Glucose-Capillary 89 70 - 99 mg/dL  Glucose, capillary     Status: None   Collection Time: 10/23/17  4:26 PM  Result Value Ref Range   Glucose-Capillary 88 70 - 99 mg/dL  Glucose, capillary     Status: Abnormal   Collection Time: 10/23/17  9:11 PM  Result Value Ref Range   Glucose-Capillary 102 (H) 70 - 99 mg/dL  Glucose, capillary     Status: None   Collection Time: 10/23/17 11:47 PM  Result Value Ref Range   Glucose-Capillary 99 70 - 99 mg/dL  Basic metabolic panel     Status: Abnormal   Collection Time: 10/24/17  4:54 AM  Result Value Ref Range   Sodium 145 135 - 145 mmol/L   Potassium 4.2 3.5 - 5.1 mmol/L   Chloride 110 98 - 111 mmol/L   CO2 29 22 - 32 mmol/L   Glucose, Bld 119 (H) 70 - 99 mg/dL   BUN 32 (H) 6 - 20 mg/dL   Creatinine, Ser 1.01 0.61 - 1.24 mg/dL   Calcium 8.1 (L) 8.9 - 10.3 mg/dL   GFR calc non Af Amer >60 >60 mL/min   GFR calc Af Amer >60 >60 mL/min    Comment: (NOTE) The eGFR has been calculated  using the CKD EPI equation. This calculation has not been validated in all clinical situations. eGFR's persistently <60 mL/min signify possible Chronic Kidney Disease.    Anion gap 6 5 - 15    Comment: Performed at [facility] [address], [place], [place] [address]  Glucose, capillary     Status: Abnormal   Collection Time: 10/24/17  6:26 AM  Result Value Ref Range   Glucose-Capillary 106 (H) 70 - 99 mg/dL  Glucose, capillary     Status: Abnormal   Collection Time: 10/24/17  8:31 AM  Result Value Ref Range   Glucose-Capillary 112 (H) 70 - 99 mg/dL  Glucose, capillary     Status: Abnormal   Collection Time: 10/24/17 12:23 PM  Result Value Ref Range   Glucose-Capillary 101 (H) 70 - 99 mg/dL  Glucose, capillary     Status: None   Collection Time: 10/24/17  4:07 PM  Result Value Ref Range   Glucose-Capillary 97 70 - 99 mg/dL  Glucose, capillary     Status: None   Collection Time: 10/24/17  8:28 PM  Result Value Ref Range   Glucose-Capillary 75 70 - 99 mg/dL  Glucose, capillary  Status: None   Collection Time: 10/25/17 12:01 AM  Result Value Ref Range   Glucose-Capillary 75 70 - 99 mg/dL  Glucose, capillary     Status: Abnormal   Collection Time: 10/25/17  4:52 AM  Result Value Ref Range   Glucose-Capillary 67 (L) 70 - 99 mg/dL  Glucose, capillary     Status: None   Collection Time: 10/25/17  6:52 AM  Result Value Ref Range   Glucose-Capillary 93 70 - 99 mg/dL  Glucose, capillary     Status: None   Collection Time: 10/25/17  7:39 AM  Result Value Ref Range   Glucose-Capillary 76 70 - 99 mg/dL   Comment 1 Notify RN     Radiology/Results: Dg Chest Port 1 View  Result Date: 10/23/2017 CLINICAL DATA:  CHF EXAM: PORTABLE CHEST 1 VIEW COMPARISON:  09/06/2017 FINDINGS: Cardiac enlargement. Vascularity normal. Negative for heart failure. Elevated right hemidiaphragm with progressive right lower lobe airspace disease. Negative for pleural effusion NG in the stomach  IMPRESSION: Negative for heart failure Progressive right lower lobe atelectasis/infiltrate. Electronically Signed   By: Franchot Gallo M.D.   On: 10/23/2017 10:19    Anti-infectives: Anti-infectives (From admission, onward)   Start     Dose/Rate Route Frequency Ordered Stop   10/22/17 0930  ceFAZolin (ANCEF) 3 g in dextrose 5 % 50 mL IVPB  Status:  Discontinued     3 g 100 mL/hr over 30 Minutes Intravenous To Short Stay 10/22/17 0926 10/22/17 1436   10/22/17 0830  ceFAZolin (ANCEF) powder 3 g     3 g Other To Surgery 10/22/17 4709 10/22/17 1040      Assessment/Plan: Problem List: Patient Active Problem List   Diagnosis Date Noted  . SBO (small bowel obstruction) ([place]) 10/24/2017  . 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 Depression 10/14/2017  . Adjustment disorder with depressed mood 09/30/2017  . Stroke ([place]) 09/14/2017  . Acute right-sided weakness   . AKI (acute kidney injury) ([place])   . Congestive heart failure ([place])   . Hyperlipidemia   . Stroke (cerebrum) ([place]) 09/07/2017  . Gout   . Acute ischemic stroke ([place]) 09/06/2017  . Anasarca   . Elevated LFTs   . Acute systolic HF (heart failure) ([place])   . Obesity, Class III, BMI 40-49.9 (morbid obesity) ([place])   . Hypoalbuminemia 07/11/2017  . Tinea corporis 07/11/2017  . Hyperbilirubinemia 07/10/2017  . Anemia 07/10/2017  . Type 2 diabetes mellitus with complication, without long-term current use of insulin ([place]) 07/10/2017  . Hypertension 07/10/2017    Begin clear liquids 3 Days Post-Op    LOS: 4 days   Matt B. Hassell Done, MD, [facility] [phone] beeper [phone]  10/25/2017 8:40 AM

## 2017-10-26 LAB — BASIC METABOLIC PANEL
ANION GAP: 5 (ref 5–15)
BUN: 15 mg/dL (ref 6–20)
CO2: 27 mmol/L (ref 22–32)
Calcium: 8.2 mg/dL — ABNORMAL LOW (ref 8.9–10.3)
Chloride: 108 mmol/L (ref 98–111)
Creatinine, Ser: 0.66 mg/dL (ref 0.61–1.24)
Glucose, Bld: 97 mg/dL (ref 70–99)
POTASSIUM: 3.6 mmol/L (ref 3.5–5.1)
Sodium: 140 mmol/L (ref 135–145)

## 2017-10-26 LAB — GLUCOSE, CAPILLARY
GLUCOSE-CAPILLARY: 80 mg/dL (ref 70–99)
GLUCOSE-CAPILLARY: 117 mg/dL — AB (ref 70–99)
Glucose-Capillary: 133 mg/dL — ABNORMAL HIGH (ref 70–99)
Glucose-Capillary: 146 mg/dL — ABNORMAL HIGH (ref 70–99)

## 2017-10-26 MED ORDER — CARVEDILOL 6.25 MG PO TABS
6.2500 mg | ORAL_TABLET | Freq: Two times a day (BID) | ORAL | Status: DC
Start: 2017-10-26 — End: 2017-10-28
  Administered 2017-10-26 – 2017-10-28 (×4): 6.25 mg via ORAL
  Filled 2017-10-26 (×4): qty 1

## 2017-10-26 MED ORDER — TORSEMIDE 20 MG PO TABS
40.0000 mg | ORAL_TABLET | Freq: Every day | ORAL | Status: DC
  Administered 2017-10-26 – 2017-10-28 (×3): 40 mg via ORAL
  Filled 2017-10-26 (×3): qty 2

## 2017-10-26 NOTE — Progress Notes (Signed)
Patient ID: Jeremiah Vance, male   DOB: Mar 11, 1971, 47 y.o.   MRN: [id number] [facility] Progress Note:   4 Days Post-Op  Subjective: Mental status is alert;  No complaints Objective: Vital signs in last 24 hours: Temp:  [98 F (36.7 C)-98.9 F (37.2 C)] 98 F (36.7 C) (07/28 0505) Pulse Rate:  [52-61] 57 (07/28 0505) Resp:  [18] 18 (07/28 0505) BP: (140-152)/(64-76) 140/73 (07/28 0505) SpO2:  [99 %-100 %] 100 % (07/28 0505) Weight:  [122.8 kg (270 lb 11.6 oz)] 122.8 kg (270 lb 11.6 oz) (07/28 0505)  Intake/Output from previous day: 07/27 0701 - 07/28 0700 In: -  Out: 4142 [Urine:1000; Drains:35] Intake/Output this shift: No intake/output data recorded.  Physical Exam: Work of breathing is not labored;  Abdomen flat.  Incision covered with JP in place.    Lab Results:  Results for orders placed or performed during the hospital encounter of 10/21/17 (from the past 48 hour(s))  Glucose, capillary     Status: Abnormal   Collection Time: 10/24/17 12:23 PM  Result Value Ref Range   Glucose-Capillary 101 (H) 70 - 99 mg/dL  Glucose, capillary     Status: None   Collection Time: 10/24/17  4:07 PM  Result Value Ref Range   Glucose-Capillary 97 70 - 99 mg/dL  Glucose, capillary     Status: None   Collection Time: 10/24/17  8:28 PM  Result Value Ref Range   Glucose-Capillary 75 70 - 99 mg/dL  Glucose, capillary     Status: None   Collection Time: 10/25/17 12:01 AM  Result Value Ref Range   Glucose-Capillary 75 70 - 99 mg/dL  Glucose, capillary     Status: Abnormal   Collection Time: 10/25/17  4:52 AM  Result Value Ref Range   Glucose-Capillary 67 (L) 70 - 99 mg/dL  Glucose, capillary     Status: None   Collection Time: 10/25/17  6:52 AM  Result Value Ref Range   Glucose-Capillary 93 70 - 99 mg/dL  Glucose, capillary     Status: None   Collection Time: 10/25/17  7:39 AM  Result Value Ref Range   Glucose-Capillary 76 70 - 99 mg/dL   Comment 1 Notify RN   Basic  metabolic panel     Status: Abnormal   Collection Time: 10/25/17 10:22 AM  Result Value Ref Range   Sodium 141 135 - 145 mmol/L   Potassium 3.7 3.5 - 5.1 mmol/L   Chloride 110 98 - 111 mmol/L   CO2 25 22 - 32 mmol/L   Glucose, Bld 83 70 - 99 mg/dL   BUN 25 (H) 6 - 20 mg/dL   Creatinine, Ser 0.98 0.61 - 1.24 mg/dL   Calcium 8.0 (L) 8.9 - 10.3 mg/dL   GFR calc non Af Amer >60 >60 mL/min   GFR calc Af Amer >60 >60 mL/min    Comment: (NOTE) The eGFR has been calculated using the CKD EPI equation. This calculation has not been validated in all clinical situations. eGFR's persistently <60 mL/min signify possible Chronic Kidney Disease.    Anion gap 6 5 - 15    Comment: Performed at [facility] [address], [place], [place] [address]  Glucose, capillary     Status: Abnormal   Collection Time: 10/25/17 12:05 PM  Result Value Ref Range   Glucose-Capillary 115 (H) 70 - 99 mg/dL   Comment 1 Notify RN   Glucose, capillary     Status: Abnormal  Collection Time: 10/25/17  5:01 PM  Result Value Ref Range   Glucose-Capillary 100 (H) 70 - 99 mg/dL   Comment 1 Notify RN   Glucose, capillary     Status: None   Collection Time: 10/25/17  9:10 PM  Result Value Ref Range   Glucose-Capillary 89 70 - 99 mg/dL  Glucose, capillary     Status: None   Collection Time: 10/26/17  7:47 AM  Result Value Ref Range   Glucose-Capillary 80 70 - 99 mg/dL    Radiology/Results: No results found.  Anti-infectives: Anti-infectives (From admission, onward)   Start     Dose/Rate Route Frequency Ordered Stop   10/22/17 0930  ceFAZolin (ANCEF) 3 g in dextrose 5 % 50 mL IVPB  Status:  Discontinued     3 g 100 mL/hr over 30 Minutes Intravenous To Short Stay 10/22/17 0926 10/22/17 1436   10/22/17 0830  ceFAZolin (ANCEF) powder 3 g     3 g Other To Surgery 10/22/17 9211 10/22/17 1040      Assessment/Plan: Problem List: Patient Active Problem List   Diagnosis Date Noted  . SBO (small bowel  obstruction) ([place]) 10/24/2017  . 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 Depression 10/14/2017  . Adjustment disorder with depressed mood 09/30/2017  . Stroke ([place]) 09/14/2017  . Acute right-sided weakness   . AKI (acute kidney injury) ([place])   . Congestive heart failure ([place])   . Hyperlipidemia   . Stroke (cerebrum) ([place]) 09/07/2017  . Gout   . Acute ischemic stroke ([place]) 09/06/2017  . Anasarca   . Elevated LFTs   . Acute systolic HF (heart failure) ([place])   . Obesity, Class III, BMI 40-49.9 (morbid obesity) ([place])   . Hypoalbuminemia 07/11/2017  . Tinea corporis 07/11/2017  . Hyperbilirubinemia 07/10/2017  . Anemia 07/10/2017  . Type 2 diabetes mellitus with complication, without long-term current use of insulin ([place]) 07/10/2017  . Hypertension 07/10/2017    Passing flatus;  Advance diet to regular 4 Days Post-Op    LOS: 5 days   Matt B. Hassell Done, MD, [facility] [phone] beeper [phone]  10/26/2017 8:45 AM

## 2017-10-26 NOTE — Progress Notes (Addendum)
Family Medicine Teaching Service Daily Progress Note Intern Pager: [phone]  Patient name: Jeremiah Vance Medical record number: [id number] Date of birth: [birth year] Age: 47 y.o. Gender: male  Primary Care Provider: Wendee Beavers, DO Consultants: FPTS Code Status: Full  Pt Overview and Major Events to Date:  7/24 - ventral hernia repair  Assessment and Plan: Jeremiah Vance is a 47 y.o. male presenting with nausea, vomiting found to have SBO due to a dilated and fluid-midline hernia.  Now POD #1 with fluid overload. Family Medicine Teaching Service consulted to evaluate.  PMH is significant for morbid obesity, NIDDM, h/o prior stroke, CHF, HTN, HLD, gout and depression.    CHF - unstable Trace edema above and below SCDs, no JVD noted today, no crackles on pulm exam, denies SOB. UOP 1L past 24hrs. Home meds: coreg 6.25 mg, lisinopril 20 mg, spironolactone 25, torsemide 20 mg. - Recommend restarting home torsemide today (7/28) - Cont to follow SCr daily - cont home coreg - continue to assess volume status - monitor vitals  AKI -resolved BL Cr: 1.0-1.2, 1.6 (7/24), 0.98 (7/27) - daily BMP to monitor K and Cr. - strick I/Os - monitor BMP  HTN - not at goal but improving Systolics overnight between 140 and 152. DBP within normal range - Cont Coreg (7/27) - Recommend restarting lisinopril and spironolactone slowly and if blood pressure remains elevated while admitted.  T2DM - low BG overnight BG down to 67 overnight, D50 given. No insulin required in past 24 hrs, pt NPO at this time. - continue rSSI  Recent CVA - stable CVA on 09/14/2017. No symptoms/complaints at this time. -Start home ASA  HDL - recent CVA -restart home atorvastatin 40   S/P ventral hernia repair for incarcerated hernia POD#3, no nausea/vomiting post NG removal, positive flatus, no BM yet. - continue management per primary - Progress diet per primary  FEN/GI: NPO, ice chips PPx: lovenox,  SCDs  Disposition: Per primary surgical team.  Recommendations are listed above, residents are unable to put in orders on this patient.  Subjective:  Pt has no complaints this am. He is tolerating his clear liquid diet with no abdominal pain. He has not worked with PT or OT as of yet. Pt noted incisional pain from surgery that is well controlled on current pain regimen.  Objective: Temp:  [98 F (36.7 C)-98.9 F (37.2 C)] 98 F (36.7 C) (07/28 0505) Pulse Rate:  [52-61] 57 (07/28 0505) Resp:  [18] 18 (07/28 0505) BP: (140-152)/(64-76) 140/73 (07/28 0505) SpO2:  [99 %-100 %] 100 % (07/28 0505) Weight:  [270 lb 11.6 oz (122.8 kg)] 270 lb 11.6 oz (122.8 kg) (07/28 0505) Physical Exam: General: Alert and cooperative and appears to be in no acute distress HEENT: No JVD. Neck non-tender without lymphadenopathy, masses or thyromegaly Cardio: Normal A1 and S2, no S3 or S4. RRR. No murmurs or rubs.   Pulm: Clear to auscultation bilaterally, no crackles, wheezing, or diminished breath sounds. Normal respiratory effort Abdomen: Bowel sounds normal. Abdomen soft and non-tender.  Extremities: Trace LE edema above SCDs and trace pedal edema. Warm/ well perfused.  Strong radial pulses.  Laboratory: Recent Labs  Lab 10/21/17 1207 10/22/17 0550 10/23/17 0327  WBC 12.4* 7.9 10.1  HGB 13.1 10.6* 10.2*  HCT 40.3 32.9* 32.7*  PLT 334 254 244   Recent Labs  Lab 10/21/17 1207  10/23/17 0327 10/24/17 0454 10/25/17 1022  NA 139   < > 142 145 141  K 4.1   < >  4.1 4.2 3.7  CL 100   < > 107 110 110  CO2 27   < > 28 29 25   BUN 39*   < > 29* 32* 25*  CREATININE 1.84*   < > 1.31* 1.01 0.98  CALCIUM 9.2   < > 7.9* 8.1* 8.0*  PROT 7.4  --   --   --   --   BILITOT 2.1*  --   --   --   --   ALKPHOS 138*  --   --   --   --   ALT 30  --   --   --   --   AST 28  --   --   --   --   GLUCOSE 154*   < > 108* 119* 83   < > = values in this interval not displayed.    Imaging/Diagnostic Tests: No  results found.   Jeremiah Harman, DO 10/26/2017, 6:23 AM PGY-2, [facility] FPTS Intern pager: [phone], text pages welcome

## 2017-10-27 DIAGNOSIS — K430 Incisional hernia with obstruction, without gangrene: Principal | ICD-10-CM

## 2017-10-27 LAB — BASIC METABOLIC PANEL
Anion gap: 7 (ref 5–15)
BUN: 14 mg/dL (ref 6–20)
CO2: 27 mmol/L (ref 22–32)
CREATININE: 1.13 mg/dL (ref 0.61–1.24)
Calcium: 8 mg/dL — ABNORMAL LOW (ref 8.9–10.3)
Chloride: 106 mmol/L (ref 98–111)
GFR calc Af Amer: >60 mL/min (ref >60)
GFR calc non Af Amer: >60 mL/min (ref >60)
Glucose, Bld: 174 mg/dL — ABNORMAL HIGH (ref 70–99)
Potassium: 3.4 mmol/L — ABNORMAL LOW (ref 3.5–5.1)
SODIUM: 140 mmol/L (ref 135–145)

## 2017-10-27 LAB — GLUCOSE, CAPILLARY
GLUCOSE-CAPILLARY: 123 mg/dL — AB (ref 70–99)
Glucose-Capillary: 88 mg/dL (ref 70–99)
Glucose-Capillary: 175 mg/dL — ABNORMAL HIGH (ref 70–99)
Glucose-Capillary: 132 mg/dL — ABNORMAL HIGH (ref 70–99)

## 2017-10-27 MED ORDER — ASPIRIN 81 MG PO CHEW: 81.0000 mg | CHEWABLE_TABLET | Freq: Every day | ORAL | Status: DC

## 2017-10-27 MED ORDER — ATORVASTATIN CALCIUM 40 MG PO TABS
40.0000 mg | ORAL_TABLET | Freq: Every day | ORAL | Status: DC
  Administered 2017-10-27: 40 mg via ORAL
  Filled 2017-10-27: qty 1

## 2017-10-27 NOTE — Progress Notes (Signed)
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 Johna Sheriff, 07/24 - having bowel function  FEN: reg diet VTE: SCD's, lovenox ID: Ancef pre-op Foley: none Follow up: Dr. Johna Sheriff  DISPO: Pt is doing great and should be ready for discharge tomorrow. He lives with his sister. PT pending    LOS: 6 days    Subjective: CC: left sided abdominal pain  Pt is tolerating diet and having BM's. No nausea, vomiting, fever, chills. No new complaints   Objective: Vital signs in last 24 hours: Temp:  [97.8 F (36.6 C)-98.1 F (36.7 C)] 97.8 F (36.6 C) (07/29 0455) Pulse Rate:  [53-59] 53 (07/29 0455) Resp:  [16-18] 18 (07/29 0455) BP: (129-148)/(62-68) 132/66 (07/29 0455) SpO2:  [98 %-100 %] 98 % (07/29 0455) Weight:  [122.8 kg (270 lb 11.6 oz)] 122.8 kg (270 lb 11.6 oz) (07/29 0455) Last BM Date: 10/26/17  Intake/Output from previous day: 07/28 0701 - 07/29 0700 In: 500 [P.O.:500] Out: 2875 [Urine:2850; Drains:25] Intake/Output this shift: Total I/O In: 240 [P.O.:240] Out: -   PE: Gen:  Alert, NAD, pleasant, cooperative Card:  RRR, no M/G/R heard Pulm:  CTA, no W/R/R, effort normal Abd: Soft, obese, ND, +BS, incisions are well appearing and are without signs of infection, drain with minimal sanguinous drainage, mild TTP to left of midline Skin: no rashes noted, warm and dry   Anti-infectives: Anti-infectives (From admission, onward)   Start     Dose/Rate Route Frequency Ordered Stop   10/22/17 0930  ceFAZolin (ANCEF) 3 g in dextrose 5 % 50 mL IVPB  Status:  Discontinued      3 g 100 mL/hr over 30 Minutes Intravenous To Short Stay 10/22/17 0926 10/22/17 1436   10/22/17 0830  ceFAZolin (ANCEF) powder 3 g     3 g Other To Surgery 10/22/17 0828 10/22/17 1040      Lab Results:  No results for input(s): WBC, HGB, HCT, PLT in the last 72 hours. BMET Recent Labs    10/25/17 1022 10/26/17 0837  NA 141 140  K 3.7 3.6  CL 110 108  CO2 25 27  GLUCOSE 83 97  BUN 25* 15  CREATININE 0.98 0.66  CALCIUM 8.0* 8.2*   PT/INR No results for input(s): LABPROT, INR in the last 72 hours. CMP     Component Value Date/Time   NA 140 10/26/2017 0837   NA 140 10/14/2017 1110   K 3.6 10/26/2017 0837   CL 108 10/26/2017 0837   CO2 27 10/26/2017 0837   GLUCOSE 97 10/26/2017 0837   BUN 15 10/26/2017 0837   BUN 26 (H) 10/14/2017 1110   CREATININE 0.66 10/26/2017 0837   CALCIUM 8.2 (L) 10/26/2017 0837   PROT 7.4 10/21/2017 1207   ALBUMIN 3.5 10/21/2017 1207   AST 28 10/21/2017 1207   ALT 30 10/21/2017 1207   ALKPHOS 138 (H) 10/21/2017 1207   BILITOT 2.1 (H) 10/21/2017 1207   GFRNONAA >60 10/26/2017 0837   GFRAA >60 10/26/2017 0837   Lipase     Component Value Date/Time   LIPASE 52 (H) 10/21/2017 1207    Studies/Results: No results found.  Jerre Simon , [facility] 10/27/2017, 11:57 AM  Pager: [phone] Mon-Wed, Friday 7:00am-4:30pm Thurs 7am-11:30am  Consults: [phone]

## 2017-10-27 NOTE — Progress Notes (Signed)
Family Medicine Teaching Service Daily Consult Note Intern Pager: [phone]  Patient name: ONEIL MELMAN Medical record number: [id number] Date of birth: [birth year] Age: 47 y.o. Gender: male  Primary Care Provider: Wendee Beavers, DO Consultants: FPTS Code Status: Full  Pt Overview and Major Events to Date:  7/24 - ventral hernia repair  Assessment and Plan: Jefferey D Hodginis a 46 y.o.malepresenting with nausea, vomiting found to have SBO due to a dilated and fluid-midline hernia. Family Medicine Teaching Service consulted to evaluate for fluid overload.PMH is significant formorbid obesity, NIDDM, h/o prior stroke, CHF, HTN, HLD, gout and depression.   CHF, unstable: Trace edema aboveand belowSCDs,noJVDnoted today, no crackles on pulm exam, denies SOB. UOP 7/29. Home meds: coreg 6.25 mg, lisinopril 20 mg, spironolactone 25, torsemide 20 mg. - Torsemide 40 restarted -Continuehome coreg 6.25, d/c'ed Lisinopril and Spiro - Volume status improved - Monitor vitals  AKI: resolved; BL Cr: 1.0-1.2, 1.13 > 1.12 (7/30) -dailyBMP to monitor K and Cr - strict I/Os - monitor BMP  HTN, improving: 145/74 on 7/30. -ContinueCoreg and Torsemide  T2DM: BG 94 7/30 - continue rSSI  Recent CVA: stable, CVA on 09/14/2017. No symptoms/complaints at this time. - Start home ASA 81  HDL: recent CVA - Atorvastatin 40  S/P ventral hernia repair for incarcerated hernia: POD#4, no N/V post NG removal, positive flatus, no BM yet. - continue management per primary - Diet Regular  FEN/GI: Regular PPx: lovenox, SCDs  Disposition: Per primary surgical team. Recommendations are listed above, residents are unable to put in orders on this patient.  Subjective: Patient is doing well today and is tolerating Regular Diet, passing gas and having bowel movements. Patient states abdominal incisions are still tender but improving. The wound dressing has been removed. He  denies nausea, vomiting, shortness of breath, chest pain, constipation, and diarrhea.  Objective: Vitals:   10/27/17 2156 10/28/17 0606  BP: (!) 150/64 (!) 145/74  Pulse: (!) 57 (!) 58  Resp: 18 18  Temp: 98.8 F (37.1 C) 97.7 F (36.5 C)  SpO2: 100% 100%   Physical Exam: General: A&O x 3, NAD HEENT:Neck non-tender without LAD, masses or thyromegaly Cardio: S1S2 Present, RRR. No murmurs or rubs.  Pulm: CTAB, no crackles, wheezing, or diminished breath sounds. Normal respiratory effort Abdomen: Bowel sounds normal in 4 quadrants. Abdomen soft and mildly tender to palpitation.  Extremities: bilateral LE without edema. Warm/ well perfused. Strong radial and dorsales pedis pulses.  Laboratory: CBC Latest Ref Rng & Units 10/23/2017 10/22/2017 10/21/2017  WBC 4.0 - 10.5 K/uL 10.1 7.9 12.4(H)  Hemoglobin 13.0 - 17.0 g/dL 10.2(L) 10.6(L) 13.1  Hematocrit 39.0 - 52.0 % 32.7(L) 32.9(L) 40.3  Platelets 150 - 400 K/uL 244 254 334   CMP Latest Ref Rng & Units 10/28/2017 10/27/2017 10/26/2017  Glucose 70 - 99 mg/dL 94 433(I) 97  BUN 6 - 20 mg/dL 16 14 15   Creatinine 0.61 - 1.24 mg/dL 9.51 8.84 1.66  Sodium 135 - 145 mmol/L 138 140 140  Potassium 3.5 - 5.1 mmol/L 3.4(L) 3.4(L) 3.6  Chloride 98 - 111 mmol/L 105 106 108  CO2 22 - 32 mmol/L 28 27 27   Calcium 8.9 - 10.3 mg/dL 8.2(L) 8.0(L) 8.2(L)  Total Protein 6.5 - 8.1 g/dL - - -  Total Bilirubin 0.3 - 1.2 mg/dL - - -  Alkaline Phos 38 - 126 U/L - - -  AST 15 - 41 U/L - - -  ALT 0 - 44 U/L - - -  MRSA Screen (7/24): negative  Imaging/Diagnostic Tests: Ct Abdomen Pelvis Wo Contrast: 10/21/2017 1. Small-bowel obstruction due to midline hernia.  2. Cholelithiasis.   Dg Chest Port 1 View: 10/23/2017 Negative for heart failure Progressive right lower lobe atelectasis/infiltrate.   Dg Abd Portable 1 View: 10/21/2017 Nasogastric tube tip in the gastric fundus region.  Peggyann Shoals, DO [facility], PGY-1 10/28/2017  9:22 AM FPTS Intern pager: [phone], text pages welcome

## 2017-10-27 NOTE — Consult Note (Signed)
Family Medicine Teaching Service Daily Consult Note Intern Pager: [phone]  Patient name: Jeremiah Vance Medical record number: [id number] Date of birth: 1970-06-30 Age: 47 y.o. Gender: male  Primary Care Provider: Wendee Beavers, DO Consultants: FPTS Code Status: Full  Pt Overview and Major Events to Date:  7/24 - ventral hernia repair  Assessment and Plan: Jeremiah D Hodginis a 47 y.o.malepresenting with nausea, vomiting found to have SBO due to a dilated and fluid-midline hernia. Family Medicine Teaching Service consulted to evaluate for fluid overload.PMH is significant formorbid obesity, NIDDM, h/o prior stroke, CHF, HTN, HLD, gout and depression.   CHF, unstable: Trace edema above and below SCDs, no JVD noted today, no crackles on pulm exam, denies SOB. UOP 2.4L 7/28 Home meds: coreg 6.25 mg, lisinopril 20 mg, spironolactone 25, torsemide 20 mg. - Torsemide 40 restarted (7/28) - Cont to follow SCr daily - Cont home coreg, d/c'ed Lisinopril and Cleda Daub - Continue to assess volume status - Monitor vitals  AKI: resolved; BL Cr: 1.0-1.2, 1.6 (7/24), 0.98 > 0.66 (7/28) - daily BMP to monitor K and Cr. - strict I/Os - monitor BMP  HTN, improving: Systolics overnight 129-148 - Cont Coreg (7/27) and Torsemide (7/28)  T2DM: BG 88 7/29 - continue rSSI  Recent CVA: stable, CVA on 09/14/2017. No symptoms/complaints at this time. - Start home ASA  HDL: recent CVA - recommend Atorvastatin 40  S/P ventral hernia repair for incarcerated hernia: POD#4, no N/V post NG removal, positive flatus, no BM yet. - continue management per primary - Diet Regular  FEN/GI: Regular PPx: lovenox, SCDs  Disposition: Per primary surgical team.  Recommendations are listed above, residents are unable to put in orders on this patient.  Subjective:  Patient is doing well today and is tolerating Regular Diet, passing gas and having bowel movements. Patient states abdominal incisions  are still tender but improving. He denies nausea, vomiting, shortness of breath, chest pain, constipation, and diarrhea.  Objective: Temp:  [97.8 F (36.6 C)-98.1 F (36.7 C)] 97.8 F (36.6 C) (07/29 0455) Pulse Rate:  [53-59] 53 (07/29 0455) Resp:  [16-18] 18 (07/29 0455) BP: (129-148)/(62-68) 132/66 (07/29 0455) SpO2:  [98 %-100 %] 98 % (07/29 0455) Weight:  [270 lb 11.6 oz (122.8 kg)] 270 lb 11.6 oz (122.8 kg) (07/29 0455) Physical Exam: General: A&O x 3, NAD HEENT: Neck non-tender without LAD, masses or thyromegaly Cardio: S1S2 Present, RRR. No murmurs or rubs.   Pulm: CTAB, no crackles, wheezing, or diminished breath sounds. Normal respiratory effort Abdomen: Bowel sounds normal in 4 quadrants. Abdomen soft and mildly tender to palpitation.  Extremities: Trace LE edema above SCDs and trace pedal edema. Warm/ well perfused.  Strong radial and dorsales pedis pulses.  Laboratory: Recent Labs  Lab 10/21/17 1207 10/22/17 0550 10/23/17 0327  WBC 12.4* 7.9 10.1  HGB 13.1 10.6* 10.2*  HCT 40.3 32.9* 32.7*  PLT 334 254 244   Recent Labs  Lab 10/21/17 1207  10/24/17 0454 10/25/17 1022 10/26/17 0837  NA 139   < > 145 141 140  K 4.1   < > 4.2 3.7 3.6  CL 100   < > 110 110 108  CO2 27   < > 29 25 27   BUN 39*   < > 32* 25* 15  CREATININE 1.84*   < > 1.01 0.98 0.66  CALCIUM 9.2   < > 8.1* 8.0* 8.2*  PROT 7.4  --   --   --   --  BILITOT 2.1*  --   --   --   --   ALKPHOS 138*  --   --   --   --   ALT 30  --   --   --   --   AST 28  --   --   --   --   GLUCOSE 154*   < > 119* 83 97   < > = values in this interval not displayed.    MRSA Screen (7/24): negative  Imaging/Diagnostic Tests: Ct Abdomen Pelvis Wo Contrast: 10/21/2017 1. Small-bowel obstruction due to midline hernia.  2. Cholelithiasis.   Dg Chest Port 1 View: 10/23/2017 Negative for heart failure Progressive right lower lobe atelectasis/infiltrate.   Dg Abd Portable 1 View: 10/21/2017 Nasogastric tube tip in  the gastric fundus region.   Dollene Cleveland, DO 10/27/2017, 9:05 AM PGY-1, [facility] FPTS Intern pager: [phone], text pages welcome

## 2017-10-27 NOTE — Plan of Care (Signed)
  Problem: Pain Managment: Goal: General experience of comfort will improve Outcome: Progressing   

## 2017-10-28 ENCOUNTER — Ambulatory Visit: Payer: Self-pay

## 2017-10-28 LAB — BASIC METABOLIC PANEL
Anion gap: 5 (ref 5–15)
BUN: 16 mg/dL (ref 6–20)
CALCIUM: 8.2 mg/dL — AB (ref 8.9–10.3)
CO2: 28 mmol/L (ref 22–32)
CREATININE: 1.12 mg/dL (ref 0.61–1.24)
Chloride: 105 mmol/L (ref 98–111)
Glucose, Bld: 94 mg/dL (ref 70–99)
Potassium: 3.4 mmol/L — ABNORMAL LOW (ref 3.5–5.1)
Sodium: 138 mmol/L (ref 135–145)

## 2017-10-28 LAB — GLUCOSE, CAPILLARY
GLUCOSE-CAPILLARY: 96 mg/dL (ref 70–99)
Glucose-Capillary: 90 mg/dL (ref 70–99)

## 2017-10-28 MED ORDER — OXYCODONE HCL 5 MG PO TABS: 5.0000 mg | ORAL_TABLET | ORAL | Status: DC | PRN

## 2017-10-28 MED ORDER — POTASSIUM CHLORIDE CRYS ER 20 MEQ PO TBCR
20.0000 meq | EXTENDED_RELEASE_TABLET | Freq: Once | ORAL | Status: AC
  Administered 2017-10-28: 20 meq via ORAL
  Filled 2017-10-28: qty 1

## 2017-10-28 MED ORDER — OXYCODONE HCL 5 MG PO TABS: 5.0000 mg | ORAL_TABLET | Freq: Four times a day (QID) | ORAL | 0 refills | Status: DC | PRN

## 2017-10-28 NOTE — Progress Notes (Signed)
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 Johna Sheriff, 07/24 - having bowel function  FEN: reg diet VTE: SCD's, lovenox ID: Ancef pre-op Foley: none Follow up: Dr. Johna Sheriff  DISPO: PT pending, likely discharge this afternoon pending their recommendations. DC JP drain    LOS: 7 days    Subjective: CC: abdominal pain  Pt is tolerating diet and having bowel function. No nausea, vomiting, fever, chills. No new complaints. No issues overnight. Slept great. Brother at bedside.   Objective: Vital signs in last 24 hours: Temp:  [97.7 F (36.5 C)-98.8 F (37.1 C)] 97.7 F (36.5 C) (07/30 0606) Pulse Rate:  [53-58] 58 (07/30 0606) Resp:  [18] 18 (07/30 0606) BP: (131-150)/(60-74) 145/74 (07/30 0606) SpO2:  [100 %] 100 % (07/30 0606) Last BM Date: 10/27/17  Intake/Output from previous day: 07/29 0701 - 07/30 0700 In: 240 [P.O.:240] Out: 305 [Urine:275; Drains:30] Intake/Output this shift: Total I/O In: -  Out: 250 [Urine:250]  PE: Gen:  Alert, NAD, pleasant, cooperative Card:  RRR, no M/G/R heard Pulm:  CTA, no W/R/R, effort normal Abd: Soft, obese, ND, +BS, incisions are well appearing and are without signs of infection, drain with minimal sanguinous drainage, mild TTP to left of midline Skin: no rashes noted, warm and dry  Anti-infectives: Anti-infectives (From admission, onward)   Start     Dose/Rate Route Frequency Ordered Stop   10/22/17 0930  ceFAZolin (ANCEF) 3 g in dextrose 5 % 50 mL IVPB  Status:  Discontinued     3 g 100 mL/hr over 30 Minutes  Intravenous To Short Stay 10/22/17 0926 10/22/17 1436   10/22/17 0830  ceFAZolin (ANCEF) powder 3 g     3 g Other To Surgery 10/22/17 0828 10/22/17 1040      Lab Results:  No results for input(s): WBC, HGB, HCT, PLT in the last 72 hours. BMET Recent Labs    10/27/17 1122 10/28/17 0630  NA 140 138  K 3.4* 3.4*  CL 106 105  CO2 27 28  GLUCOSE 174* 94  BUN 14 16  CREATININE 1.13 1.12  CALCIUM 8.0* 8.2*   PT/INR No results for input(s): LABPROT, INR in the last 72 hours. CMP     Component Value Date/Time   NA 138 10/28/2017 0630   NA 140 10/14/2017 1110   K 3.4 (L) 10/28/2017 0630   CL 105 10/28/2017 0630   CO2 28 10/28/2017 0630   GLUCOSE 94 10/28/2017 0630   BUN 16 10/28/2017 0630   BUN 26 (H) 10/14/2017 1110   CREATININE 1.12 10/28/2017 0630   CALCIUM 8.2 (L) 10/28/2017 0630   PROT 7.4 10/21/2017 1207   ALBUMIN 3.5 10/21/2017 1207   AST 28 10/21/2017 1207   ALT 30 10/21/2017 1207   ALKPHOS 138 (H) 10/21/2017 1207   BILITOT 2.1 (H) 10/21/2017 1207   GFRNONAA >60 10/28/2017 0630   GFRAA >60 10/28/2017 0630   Lipase     Component Value Date/Time   LIPASE 52 (H) 10/21/2017 1207    Studies/Results: No results found.    Jerre Simon , [facility] Surgery 10/28/2017, 10:34 AM  Pager: [phone] Mon-Wed,  Friday 7:00am-4:30pm Thurs 7am-11:30am  Consults: [phone]

## 2017-10-28 NOTE — Discharge Summary (Signed)
[facility] Discharge Summary   Patient ID: Jeremiah Vance MRN: [id number] DOB/AGE: 1970-04-09 47 y.o.  Admit date: 10/21/2017 Discharge date: 10/28/2017  Admitting Diagnosis: Abdominal pain Small bowel obstruction Incarcerated ventral hernia  Discharge Diagnosis Patient Active Problem List   Diagnosis Date Noted  . SBO (small bowel obstruction) (HCC) 10/24/2017  . 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 Depression 10/14/2017  . Adjustment disorder with depressed mood 09/30/2017  . Stroke (HCC) 09/14/2017  . Acute right-sided weakness   . AKI (acute kidney injury) (HCC)   . CHF (congestive heart failure) (HCC)   . Hyperlipidemia   . Stroke (cerebrum) (HCC) 09/07/2017  . Gout   . Acute ischemic stroke (HCC) 09/06/2017  . Anasarca   . Elevated LFTs   . Acute systolic HF (heart failure) (HCC)   . Obesity, Class III, BMI 40-49.9 (morbid obesity) (HCC)   . Hypoalbuminemia 07/11/2017  . Tinea corporis 07/11/2017  . Hyperbilirubinemia 07/10/2017  . Anemia 07/10/2017  . Type 2 diabetes mellitus with complication, without long-term current use of insulin (HCC) 07/10/2017  . Hypertension 07/10/2017    Consultants Medicine   Imaging: No results found.  Procedures Dr. Johna Sheriff (10/22/17) - Repair of incarcerated recurrent ventral incisional hernia with mesh, retrorectus  HPI: Patient is a 47 year old male with a past medical history significant for hypertension, diabetes, history of stroke, morbid obesity, who states he began having abdominal pain, nausea and vomiting last night approximately 8 PM.  He did state that he noticed this hernia was bulging out and was not reducible.  Patient states that this continued overnight.  He states he had minimal sleep.  Patient did state that he had some diarrhea up until 4 AM this morning.  Secondary to continued abdominal pain, nausea vomiting he presented to the ER for further  evaluation and management.  Patient does have a history of a previous ventral hernia repair and bowel obstruction approximately 11 years ago with primary repair of the hernia no mesh placement.  Upon evaluation in the ER the patient underwent CT scan which did reveal small bowel obstruction likely due to the ventral hernia that contained small bowel.  General surgery was consulted for further evaluation and management.  Hospital Course:  Patient was admitted and NGT was placed. Pt underwent procedure listed above.  Tolerated procedure well and was transferred to the floor. Medicine was consulted to assist with patients comorbidities. NGT was removed on POD#3 and diet was advanced as tolerated. Patient worked with PT prior to discharge. On POD#6, the patient was voiding well, tolerating diet, ambulating well, pain well controlled, vital signs stable, incisions c/d/i and felt stable for discharge home.  Patient will follow up in our office as outlined below and knows to call with questions or concerns.  Patient was discharged in good condition.  The [place] Substance controlled database was reviewed prior to prescribing narcotic pain medication to this patient.  Physical Exam: Gen: Alert, NAD, pleasant, cooperative Card: RRR, no M/G/R heard Pulm: CTA, no W/R/R, effort normal Abd: Soft,obese,ND, +BS, incisions are well appearing and are without signs of infection, drain with minimal sanguinous drainage,mild TTP to left of midline Skin: no rashes noted, warm and dry  Allergies as of 10/28/2017   No Known Allergies     Medication List    TAKE these medications   acetaminophen 500 MG tablet Commonly known as:  TYLENOL Take 500 mg by mouth every 6 (six) hours as needed for  mild pain or moderate pain.   aspirin 81 MG chewable tablet Chew 1 tablet (81 mg total) by mouth daily.   atorvastatin 40 MG tablet Commonly known as:  LIPITOR Take 1 tablet (40 mg total) by mouth daily at  6 PM.   carvedilol 6.25 MG tablet Commonly known as:  COREG Take 1 tablet (6.25 mg total) by mouth 2 (two) times daily.   lisinopril 20 MG tablet Commonly known as:  PRINIVIL,ZESTRIL Take 0.5 tablets (10 mg total) by mouth daily.   metFORMIN 500 MG tablet Commonly known as:  GLUCOPHAGE Take 1 tablet (500 mg total) by mouth 2 (two) times daily with a meal.   oxyCODONE 5 MG immediate release tablet Commonly known as:  Oxy IR/ROXICODONE Take 1 tablet (5 mg total) by mouth every 6 (six) hours as needed for severe pain.   spironolactone 25 MG tablet Commonly known as:  ALDACTONE Take 1 tablet (25 mg total) by mouth daily.   torsemide 20 MG tablet Commonly known as:  DEMADEX Take 2 tablets (40 mg total) by mouth daily.        Follow-up Information    Glenna Fellows, MD. Go on 11/05/2017.   Specialty:  General Surgery Why:  at 10:15am for a follow up appointment and staple removal. Please arrive 30 minutes prior to complete paperwork. Please bring photo ID and insurance card Contact information: [address] [place] [address] [phone]           Signed: Joyce Copa [facility] Surgery 10/28/2017, 3:04 PM Pager: [phone] Consults: [phone] Mon-Fri 7:00 am-4:30 pm Sat-Sun 7:00 am-11:30 am

## 2017-10-28 NOTE — Evaluation (Signed)
Physical Therapy Evaluation Patient Details Name: Jeremiah Vance MRN: [id number] DOB: [birth year] Today's Date: 10/28/2017   History of Present Illness  Patient is a 47 year old male with a past medical history significant for hypertension, diabetes, history of stroke, morbid obesity, who states he began having abdominal pain, nausea and vomiting last night approximately 8 PM.  He did state that he noticed this hernia was bulging out and was not reducible. Underwent repair on 10/22/17.   Clinical Impression  Pt admitted with above diagnosis. Pt currently with functional limitations due to the deficits listed below (see PT Problem List). Pt ambulated 100' with RW with min-guard A and flexed posture, working on standing erect. Will follow patient acutely but do not anticipate him needing therapy after d/c.  Pt will benefit from skilled PT to increase their independence and safety with mobility to allow discharge to the venue listed below.       Follow Up Recommendations No PT follow up    Equipment Recommendations  None recommended by PT    Recommendations for Other Services       Precautions / Restrictions Precautions Precautions: None Restrictions Weight Bearing Restrictions: No      Mobility  Bed Mobility               General bed mobility comments: pt in chair but discussed log rolling to minimize abdominal pain  Transfers Overall transfer level: Needs assistance Equipment used: Rolling walker (2 wheeled) Transfers: Sit to/from Stand Sit to Stand: Supervision         General transfer comment: pt stands very slowly with increased pain but no physical assist needed  Ambulation/Gait Ambulation/Gait assistance: Supervision Gait Distance (Feet): 100 Feet Assistive device: Rolling walker (2 wheeled) Gait Pattern/deviations: Step-through pattern;Trunk flexed Gait velocity: decreased Gait velocity interpretation: <1.8 ft/sec, indicate of risk for recurrent  falls General Gait Details: vc's for erect posture as pt having difficulty with this due to pain.   Stairs            Wheelchair Mobility    Modified Rankin (Stroke Patients Only)       Balance Overall balance assessment: Mild deficits observed, not formally tested                                           Pertinent Vitals/Pain Pain Assessment: 0-10 Pain Score: 5  Pain Location: abdomen Pain Descriptors / Indicators: Operative site guarding;Sore Pain Intervention(s): Limited activity within patient's tolerance;Monitored during session;Premedicated before session    Home Living Family/patient expects to be discharged to:: Private residence Living Arrangements: Other relatives Available Help at Discharge: Family;Available PRN/intermittently Type of Home: Apartment Home Access: Stairs to enter   Entrance Stairs-Number of Steps: 2 Home Layout: One level Home Equipment: Walker - 2 wheels Additional Comments: pt has been at sister's since last adnmission and plans to go back there until he can get a ground level apt for himself    Prior Function Level of Independence: Independent         Comments: pt has been performing own ADL's and has gotten off RW and has been walking a couple of miles a day     Hand Dominance   Dominant Hand: Right    Extremity/Trunk Assessment   Upper Extremity Assessment Upper Extremity Assessment: RUE deficits/detail RUE Deficits / Details: has some mild residual weakness from CVA's  Lower Extremity Assessment Lower Extremity Assessment: RLE deficits/detail RLE Deficits / Details: grossly 4/5 throughout RLE Sensation: WNL RLE Coordination: WNL    Cervical / Trunk Assessment Cervical / Trunk Assessment: Kyphotic  Communication   Communication: No difficulties  Cognition Arousal/Alertness: Awake/alert Behavior During Therapy: WFL for tasks assessed/performed Overall Cognitive Status: Within Functional  Limits for tasks assessed                                        General Comments      Exercises     Assessment/Plan    PT Assessment Patient needs continued PT services  PT Problem List Decreased activity tolerance;Decreased mobility;Pain;Decreased strength       PT Treatment Interventions DME instruction;Gait training;Stair training;Functional mobility training;Therapeutic activities;Therapeutic exercise;Balance training;Patient/family education    PT Goals (Current goals can be found in the Care Plan section)  Acute Rehab PT Goals Patient Stated Goal: return home to sister's PT Goal Formulation: With patient Time For Goal Achievement: 11/11/17 Potential to Achieve Goals: Good    Frequency Min 3X/week   Barriers to discharge Decreased caregiver support sister works 10 hr days    Co-evaluation               AM-PAC PT "6 Clicks" Daily Activity  Outcome Measure Difficulty turning over in bed (including adjusting bedclothes, sheets and blankets)?: A Lot Difficulty moving from lying on back to sitting on the side of the bed? : A Lot Difficulty sitting down on and standing up from a chair with arms (e.g., wheelchair, bedside commode, etc,.)?: A Little Help needed moving to and from a bed to chair (including a wheelchair)?: A Little Help needed walking in hospital room?: A Little Help needed climbing 3-5 steps with a railing? : A Little 6 Click Score: 16    End of Session   Activity Tolerance: Patient tolerated treatment well Patient left: in chair;with call bell/phone within reach Nurse Communication: Mobility status PT Visit Diagnosis: Other abnormalities of gait and mobility (R26.89);Pain    Time: 2130-8657 PT Time Calculation (min) (ACUTE ONLY): 27 min   Charges:   PT Evaluation $PT Eval Moderate Complexity: 1 Mod PT Treatments $Gait Training: 8-22 mins        Lyanne Co, PT  Acute Rehab Services  [phone]   [place] L  Sivan Quast 10/28/2017, 1:45 PM

## 2017-10-28 NOTE — Discharge Instructions (Signed)
[facility] _______[facility] ° °HERNIA REPAIR: POST OP INSTRUCTIONS ° °Always review your discharge instruction sheet given to you by the facility where your surgery was performed. °IF YOU HAVE DISABILITY OR FAMILY LEAVE FORMS, YOU MUST BRING THEM TO THE OFFICE FOR PROCESSING.   °DO NOT GIVE THEM TO YOUR DOCTOR. ° °1. A  prescription for pain medication may be given to you upon discharge.  Take your pain medication as prescribed, if needed.  If narcotic pain medicine is not needed, then you may take acetaminophen (Tylenol) or ibuprofen (Advil) as needed. °2. Take your usually prescribed medications unless otherwise directed. °If you need a refill on your pain medication, please contact your pharmacy.  They will contact our office to request authorization. Prescriptions will not be filled after 5 pm or on week-ends. °3. You should follow a light diet the first 24 hours after arrival home, such as soup and crackers, etc.  Be sure to include lots of fluids daily.  Resume your normal diet the day after surgery. °4.Most patients will experience some swelling and bruising around the umbilicus or in the groin and scrotum.  Ice packs and reclining will help.  Swelling and bruising can take several days to resolve.  °6. It is common to experience some constipation if taking pain medication after surgery.  Increasing fluid intake and taking a stool softener (such as Colace) will usually help or prevent this problem from occurring.  A mild laxative (Milk of Magnesia or Miralax) should be taken according to package directions if there are no bowel movements after 48 hours. °7. Unless discharge instructions indicate otherwise, you may remove your bandages 24-48 hours after surgery, and you may shower at that time.  You may have steri-strips (small skin tapes) in place directly over the incision.  These strips should be left on the skin for 7-10 days.  If your surgeon used skin glue on the incision, you may shower in  24 hours.  The glue will flake off over the next 2-3 weeks.  Any sutures or staples will be removed at the office during your follow-up visit. °8. ACTIVITIES:  You may resume regular (light) daily activities beginning the next day--such as daily self-care, walking, climbing stairs--gradually increasing activities as tolerated.  You may have sexual intercourse when it is comfortable.  Refrain from any heavy lifting or straining until approved by your doctor. ° °a.You may drive when you are no longer taking prescription pain medication, you can comfortably wear a seatbelt, and you can safely maneuver your car and apply brakes. °b.RETURN TO WORK:   °_____________________________________________ ° °9.You should see your doctor in the office for a follow-up appointment approximately 2-3 weeks after your surgery.  Make sure that you call for this appointment within a day or two after you arrive home to insure a convenient appointment time. °10.OTHER INSTRUCTIONS: _________________________ °   _____________________________________ ° °WHEN TO CALL YOUR DOCTOR: °1. Fever over 101.0 °2. Inability to urinate °3. Nausea and/or vomiting °4. Extreme swelling or bruising °5. Continued bleeding from incision. °6. Increased pain, redness, or drainage from the incision ° °The clinic staff is available to answer your questions during regular business hours.  Please don’t hesitate to call and ask to speak to one of the nurses for clinical concerns.  If you have a medical emergency, go to the nearest emergency room or call 911.  A surgeon from [facility] is always on call at the hospital ° ° °[address], [address], [place], [place]  [address] ? ° [address] [address], [place], [place]   [address] °[phone] ? [phone] ? FAX [phone] °Web site: [email or web address] ° °

## 2017-10-28 NOTE — Progress Notes (Signed)
Discharge instructions reviewed with pt and pt's family and prescription given.  Pt verbalized understanding and had no questions.  Pt discharged in stable condition via wheelchair with family.  Jeremiah Vance

## 2017-11-11 ENCOUNTER — Encounter: Payer: Self-pay | Admitting: Family Medicine

## 2017-11-11 ENCOUNTER — Encounter: Payer: Self-pay | Admitting: Adult Health

## 2017-11-17 NOTE — Progress Notes (Signed)
Subjective   Patient ID: Jeremiah Vance    DOB: [birth year], 47 y.o. male   MRN: [id number]  CC: "Hospital follow-up"  HPI: Jeremiah Vance is a 47 y.o. male who presents to clinic today for the following:  Small bowel obstruction: Patient here today after being hospitalized on 7/23-7/30/2019 for incarcerated hernia resulting in small bowel obstruction.  This was surgically repaired and patient has followed up with surgeon with removal of staples.  He reports regular bowel movement and diet at present.  Denies fevers or chills, melena or hematochezia, diarrhea or constipation, abdominal pain.  He is on a daily aspirin.  He has been making efforts to avoid lifting heavier than 10 pounds or standing for longer than 2 hours at a time.  He is requesting to have FMLA renewed due to expiration in approximately 3 weeks.  Hypertension: Patient requesting refills for his antihypertensive.  He has transition to a new pharmacy.  He reports not taking his medications earlier today.  He does feel that he misses his medications at time during the week.  He is a non-smoker.  Heart failure: Patient with long-standing use of diuretics including furosemide and Spironolactone.  He has since restarted his medication since his surgery.  She is not sure what his dry weight is and cannot remember when his next appointment with the cardiologist is.  He denies chest pain, shortness of breath, palpitations, feelings of syncope or fatigue.  He does have chronic issues walking greater than 200 feet in distance without getting short of breath.  Diabetes: Patient has long-standing use of metformin which is recently restarted following surgery.  His A1c has been controlled with his last level at 6.9.  He would like a refill of medication today.  He has not received his annual diabetic eye exam.   ROS: see HPI for pertinent.  [place]: DM, HFpEF, obesity,h/o CVA,gout, HTN, HLD.Surgical history hernia. Family history stroke,  DM, heart disease. Smoking status reviewed. Medications reviewed.  Objective   BP (!) 148/84   Pulse 64   Temp 98.2 F (36.8 C) (Oral)   Ht '5\' 11"'  (1.803 m)   Wt 282 lb (127.9 kg)   SpO2 98%   BMI 39.33 kg/m  Vitals and nursing note reviewed.  General: well nourished, well developed, NAD with non-toxic appearance HEENT: normocephalic, atraumatic, moist mucous membranes Cardiovascular: regular rate and rhythm without murmurs, rubs, or gallops Lungs: clear to auscultation bilaterally with normal work of breathing Abdomen: obese, soft, non-tender, non-distended, normoactive bowel sounds Skin: warm, dry, no rashes or lesions, cap refill < 2 seconds, well-healed midline abdominal scar below umbilicus Extremities: warm and well perfused, normal tone, 1+ pitting edema up to upper shins bilaterally, 5/5 motor strength on left upper and lower extremity with 4/5 strength on right upper and lower extremity Neuro: CNII-XII intact without dysarthria or facial droop  Assessment & Plan   History of small bowel obstruction Resolved status post recurrent incarcerated hernia repair.  Making adequate bowel movements and tolerating regular diet.  Incision well-healed. - Follow-up with general surgery as needed - Reviewed return precautions - Checking CMET and CBC  (HFpEF) heart failure with preserved ejection fraction (HCC) Chronic.  Secondary to history of MI. EF improved, now 60-65%.  Currently taking torsemide 40 mg daily.  Patient is unsure of dry weight but does show signs of increased weight gain at 282 pounds as compared to 270s historically.  He is urinating without difficulty.  Recent hospitalization for incarcerated hernia  and SBO likely are attributing to weight gain. - Continue torsemide 40 mg daily and monitor weight  Type 2 diabetes mellitus with complication, without long-term current use of insulin (HCC) Chronic.  Controlled based on A1c 6.9, 2 months ago during hospitalization.  Not  established with ophthalmology. - Ambulatory referral to ophthalmology for annual diabetic eye exam - Performed annual diabetic foot exam  Orders Placed This Encounter  Procedures  . CMP14+EGFR  . CBC  . Ambulatory referral to Ophthalmology    Referral Priority:   Routine    Referral Type:   Consultation    Referral Reason:   Specialty Services Required    Requested Specialty:   Ophthalmology    Number of Visits Requested:   1   Meds ordered this encounter  Medications  . spironolactone (ALDACTONE) 25 MG tablet    Sig: Take 1 tablet (25 mg total) by mouth daily.    Dispense:  30 tablet    Refill:  3  . torsemide (DEMADEX) 20 MG tablet    Sig: Take 2 tablets (40 mg total) by mouth daily.    Dispense:  60 tablet    Refill:  4  . metFORMIN (GLUCOPHAGE) 500 MG tablet    Sig: Take 1 tablet (500 mg total) by mouth 2 (two) times daily with a meal.    Dispense:  180 tablet    Refill:  3  . lisinopril (PRINIVIL,ZESTRIL) 20 MG tablet    Sig: Take 0.5 tablets (10 mg total) by mouth daily.    Dispense:  30 tablet    Refill:  3  . carvedilol (COREG) 6.25 MG tablet    Sig: Take 1 tablet (6.25 mg total) by mouth 2 (two) times daily.    Dispense:  180 tablet    Refill:  3  . atorvastatin (LIPITOR) 40 MG tablet    Sig: Take 1 tablet (40 mg total) by mouth daily at 6 PM.    Dispense:  90 tablet    Refill:  [place], DO [place], PGY-3 11/18/2017, 9:05 PM

## 2017-11-18 ENCOUNTER — Ambulatory Visit (INDEPENDENT_AMBULATORY_CARE_PROVIDER_SITE_OTHER): Payer: Self-pay | Admitting: Family Medicine

## 2017-11-18 ENCOUNTER — Other Ambulatory Visit: Payer: Self-pay

## 2017-11-18 ENCOUNTER — Encounter: Payer: Self-pay | Admitting: Family Medicine

## 2017-11-18 ENCOUNTER — Ambulatory Visit (INDEPENDENT_AMBULATORY_CARE_PROVIDER_SITE_OTHER): Payer: Self-pay | Admitting: Psychology

## 2017-11-18 VITALS — BP 148/84 | HR 64 | Temp 98.2°F | Ht 71.0 in | Wt 282.0 lb

## 2017-11-18 DIAGNOSIS — E118 Type 2 diabetes mellitus with unspecified complications: Secondary | ICD-10-CM

## 2017-11-18 DIAGNOSIS — I5032 Chronic diastolic (congestive) heart failure: Secondary | ICD-10-CM

## 2017-11-18 DIAGNOSIS — Z8719 Personal history of other diseases of the digestive system: Secondary | ICD-10-CM

## 2017-11-18 DIAGNOSIS — F4321 Adjustment disorder with depressed mood: Secondary | ICD-10-CM

## 2017-11-18 DIAGNOSIS — I10 Essential (primary) hypertension: Secondary | ICD-10-CM

## 2017-11-18 MED ORDER — METFORMIN HCL 500 MG PO TABS: 500.0000 mg | ORAL_TABLET | Freq: Two times a day (BID) | ORAL | 3 refills | Status: DC

## 2017-11-18 MED ORDER — SPIRONOLACTONE 25 MG PO TABS: 25.0000 mg | ORAL_TABLET | Freq: Every day | ORAL | 3 refills | Status: DC

## 2017-11-18 MED ORDER — CARVEDILOL 6.25 MG PO TABS: 6.2500 mg | ORAL_TABLET | Freq: Two times a day (BID) | ORAL | 3 refills | Status: DC

## 2017-11-18 MED ORDER — ATORVASTATIN CALCIUM 40 MG PO TABS: 40.0000 mg | ORAL_TABLET | Freq: Every day | ORAL | 3 refills | Status: DC

## 2017-11-18 MED ORDER — TORSEMIDE 20 MG PO TABS: 40.0000 mg | ORAL_TABLET | Freq: Every day | ORAL | 4 refills | Status: DC

## 2017-11-18 MED ORDER — LISINOPRIL 20 MG PO TABS: 10.0000 mg | ORAL_TABLET | Freq: Every day | ORAL | 3 refills | Status: DC

## 2017-11-18 NOTE — Assessment & Plan Note (Signed)
Self-report numbers look pretty good today (PHQ-9 of 4; GAD-7 of 8).  Affect is bright.  He says he is having a good day.  Likes to get out of the house.  Eye contact is below average.  His biopsychosocial picture is challenging.  Looked at minor things he might be able to shift in order to feel better with a focus on his sense of worth.    He seems to have the behavioral side of things fairly covered.  Would encourage looking at volunteer work or Vocational Rehab when he is a bit further out from his health issues.    Looked at the cognitive side of things and specifically a sense of worth.  Will follow in four weeks to check-in and make sure he isn't regressing.  See patient instructions for further plan.

## 2017-11-18 NOTE — Assessment & Plan Note (Addendum)
Resolved status post recurrent incarcerated hernia repair.  Making adequate bowel movements and tolerating regular diet.  Incision well-healed. - Follow-up with general surgery as needed - Reviewed return precautions - Checking CMET and CBC

## 2017-11-18 NOTE — Assessment & Plan Note (Addendum)
Chronic.  Controlled based on A1c 6.9, 2 months ago during hospitalization.  Not established with ophthalmology. - Ambulatory referral to ophthalmology for annual diabetic eye exam - Performed annual diabetic foot exam

## 2017-11-18 NOTE — Assessment & Plan Note (Addendum)
Chronic.  Secondary to history of MI. EF improved, now 60-65%.  Currently taking torsemide 40 mg daily.  Patient is unsure of dry weight but does show signs of increased weight gain at 282 pounds as compared to 270s historically.  He is urinating without difficulty.  Recent hospitalization for incarcerated hernia and SBO likely are attributing to weight gain. - Continue torsemide 40 mg daily and monitor weight

## 2017-11-18 NOTE — Patient Instructions (Signed)
Please follow up on:  September 17th at 10:30.  If you can't get transportation, have your sister call me to reschedule.    Great job making sure you are getting a mix of rest and activity.  I love that you are walking two times a day.  That's great for your mental and physical health.  Watch the napping.  Consider setting an alarm so you don't mess up your night time sleep.  Also watch the amount of screen time.  Switching up with things like word puzzles - getting away from the screen can give your brain a break.  Finally - monitor your thoughts.  When you start to think about not being worthwhile, remember what gives a person worth.  It isn't a job, car, or how much money.  It is more about how they treat others.  Think kindness, generosity, honesty, trustworthy - and your important one - a good attitude.

## 2017-11-18 NOTE — Patient Instructions (Addendum)
Thank you for coming in to see [place] today. Please see below to review our plan for today's visit.  1.  I have provided you a letter advising you to be considered for Medicaid.  This will primarily fall on you and your family or caregivers in order to be set up.  I am happy to fill out your FMLA when you need to be recertified in the next few weeks.  Please stop by and drop off a new FMLA form and I will contact you when it is ready to be picked up. 2.  Follow-up with your surgeon if needed regarding the hernia repair. 3.  I have sent in your medication refills to the pharmacy that you requested. 4.  I would like to see you again in 1 month for your diabetes follow-up.  I have placed a referral for you to see an eye doctor for your diabetic annual eye exam.  Please make sure this is scheduled after they contact you in the next few weeks.  Please call the clinic at [phone] if your symptoms worsen or you have any concerns. It was our pleasure to serve you.  Durward Parcel, DO [facility], PGY-3

## 2017-11-18 NOTE — Progress Notes (Signed)
Patient followed up after a brief warm handoff.  In the interim, he had a surgery to repair an old hernia.    Presenting Issue:  Patient has had several stressful events over the last few weeks with the largest being a stroke and his wife filing for separation.  He is no longer working, driving, or living in the same place.    History not gathered previously:  Psychiatric History - Diagnoses: None reported. - Hospitalizations: Denied. - Pharmacotherapy: Denied. - Outpatient therapy: Denied.   Family history of psychiatric issues:  Reports his biological mother struggled with mental health issues.  Might have been hospitalized.  Treated but he doesn't know with what or for what.  Denied issues in father or siblings.    Current and history of substance use:  Denied use of all substances.  History of weekend binge drinking.  Stopped about 20 years ago.  Attended AA for a brief time.  Work / Education:  Merchandiser, retail; Some college at [employer]  Worked at [employer] for 12 years (full time) until recent health issues.  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  First marriage for both.  No children.  She filed for separation.  He is doing a fair amount of things in order to help keep him pointed in a good direction.  Fairly regular sleep schedule, two periods of activity (walking), puzzles, word puzzles, spider solitaire on laptop, two television shows he watches with his sister.  Also has hopes of visiting his dad at the beach for a few days.  He seems to have good social support in his sisters [name] and Engineer, manufacturing.    PHQ-9:  4 GAD-7:  8

## 2017-11-19 ENCOUNTER — Encounter: Payer: Self-pay | Admitting: Family Medicine

## 2017-11-19 LAB — CBC
HEMATOCRIT: 30.6 % — AB (ref 37.5–51.0)
Hemoglobin: 9.9 g/dL — ABNORMAL LOW (ref 13.0–17.7)
MCH: 30.7 pg (ref 26.6–33.0)
MCHC: 32.4 g/dL (ref 31.5–35.7)
MCV: 95 fL (ref 79–97)
Platelets: 333 10*3/uL (ref 150–450)
RBC: 3.23 x10E6/uL — ABNORMAL LOW (ref 4.14–5.80)
RDW: 14.6 % (ref 12.3–15.4)
WBC: 7 10*3/uL (ref 3.4–10.8)

## 2017-11-19 LAB — CMP14+EGFR
ALBUMIN: 3.3 g/dL — AB (ref 3.5–5.5)
ALT: 18 IU/L (ref 0–44)
AST: 20 IU/L (ref 0–40)
Albumin/Globulin Ratio: 1 — ABNORMAL LOW (ref 1.2–2.2)
Alkaline Phosphatase: 156 IU/L — ABNORMAL HIGH (ref 39–117)
BUN / CREAT RATIO: 13 (ref 9–20)
BUN: 15 mg/dL (ref 6–24)
Bilirubin Total: 0.5 mg/dL (ref 0.0–1.2)
CALCIUM: 8.9 mg/dL (ref 8.7–10.2)
CO2: 23 mmol/L (ref 20–29)
CREATININE: 1.19 mg/dL (ref 0.76–1.27)
Chloride: 103 mmol/L (ref 96–106)
GFR, EST AFRICAN AMERICAN: 84 mL/min/{1.73_m2} (ref >59)
GFR, EST NON AFRICAN AMERICAN: 72 mL/min/{1.73_m2} (ref >59)
Globulin, Total: 3.2 g/dL (ref 1.5–4.5)
Glucose: 147 mg/dL — ABNORMAL HIGH (ref 65–99)
Potassium: 4.7 mmol/L (ref 3.5–5.2)
Sodium: 139 mmol/L (ref 134–144)
TOTAL PROTEIN: 6.5 g/dL (ref 6.0–8.5)

## 2017-12-16 ENCOUNTER — Ambulatory Visit (INDEPENDENT_AMBULATORY_CARE_PROVIDER_SITE_OTHER): Payer: Self-pay | Admitting: Family Medicine

## 2017-12-16 ENCOUNTER — Ambulatory Visit (INDEPENDENT_AMBULATORY_CARE_PROVIDER_SITE_OTHER): Payer: Self-pay | Admitting: Psychology

## 2017-12-16 ENCOUNTER — Encounter: Payer: Self-pay | Admitting: Family Medicine

## 2017-12-16 VITALS — BP 140/80 | HR 59 | Temp 98.5°F | Wt 277.0 lb

## 2017-12-16 DIAGNOSIS — F4321 Adjustment disorder with depressed mood: Secondary | ICD-10-CM

## 2017-12-16 DIAGNOSIS — D649 Anemia, unspecified: Secondary | ICD-10-CM

## 2017-12-16 DIAGNOSIS — R5382 Chronic fatigue, unspecified: Secondary | ICD-10-CM

## 2017-12-16 DIAGNOSIS — Z23 Encounter for immunization: Secondary | ICD-10-CM

## 2017-12-16 DIAGNOSIS — I10 Essential (primary) hypertension: Secondary | ICD-10-CM | POA: Insufficient documentation

## 2017-12-16 DIAGNOSIS — E118 Type 2 diabetes mellitus with unspecified complications: Secondary | ICD-10-CM

## 2017-12-16 HISTORY — DX: Anemia, unspecified: D64.9

## 2017-12-16 LAB — POCT GLYCOSYLATED HEMOGLOBIN (HGB A1C): HBA1C, POC (CONTROLLED DIABETIC RANGE): 5.4 % (ref 0.0–7.0)

## 2017-12-16 MED ORDER — LISINOPRIL 20 MG PO TABS: 20.0000 mg | ORAL_TABLET | Freq: Every day | ORAL | 3 refills | Status: DC

## 2017-12-16 NOTE — Progress Notes (Signed)
Thank you for the suggestion. I already gave hime the paper fork for this resource. Did not know it existed!  Durward Parcel, DO [facility], PGY-3

## 2017-12-16 NOTE — Patient Instructions (Addendum)
I am glad to hear you are doing well. I think you are doing a lot of the right things.  Continuing with the exercise is key and I am glad to hear you are making changes in your eating habits as well.  No need to schedule a follow-up.  Feel free to call me at [phone] if you would like to schedule a follow-up.

## 2017-12-16 NOTE — Progress Notes (Signed)
Reason for follow-up:  Patient returns for continued assessment and treatment of mood and anxiety symptoms in the context of a recent stroke.  Issues discussed:  Overall he reports he is doing well.   He has not been to the beach but hopes to go around 10/4 when his dad comes here for his niece's wedding.  He is working to get some resources ([employer], unemployment, medicaid) and has had some success. He feels like he has pressure from the sister he lives with to get a job but states he can't work until cleared from neurology.  This appointment is in October. He is dropping weight via exercise and better eating habits.  He has had some difficulty with his sister's boyfriend (new boyfriend who recently moved in with them). He has a strategy for minimizing difficulty.

## 2017-12-16 NOTE — Assessment & Plan Note (Addendum)
Chronic.  Well-controlled, A1c 5.4.  No history of hypoglycemia. - Continue metformin 500 mg twice daily - Given annual flu shot - RTC 3 months for next A1c

## 2017-12-16 NOTE — Assessment & Plan Note (Signed)
Chronic.  Onset around recent hospitalization earlier in the year for partial SBO.  Patient without signs of active bleeding.  Suspect this is anemia of chronic inflammation.  Patient not yet of age for screening colonoscopy and there are no indications for early screening. - Checking iron and TIBC with ferritin level

## 2017-12-16 NOTE — Assessment & Plan Note (Signed)
Chronic.  Controlled.  Patient has been taking increased dose of lisinopril.  Last kidney function appears to be intact despite 2 months of use patient is without changes to urination. - Given refill for lisinopril 20 mg daily

## 2017-12-16 NOTE — Patient Instructions (Addendum)
Thank you for coming in to see [place] today. Please see below to review our plan for today's visit.  1.  I will call you if there are any abnormalities in your labs, otherwise you should receive results in the mail. 2.  You are now up-to-date on your annual flu shot. 3.  Follow-up with your scheduled appointment with the ophthalmologist.  Please have them fax over your their findings when you complete your appointment. 4.  Your diabetes appears to be very well controlled. 5.  I will have your FMLA paperwork done by the end of the day.  You can pick it up at any time during the week.  I have also given you a form for vocational rehabilitation services.  Please call and schedule appointment with them.  They can help you out with determining what you can and can do based on your current state of health.  They are very helpful and can provide you a great service.  Please call the clinic at [phone] if your symptoms worsen or you have any concerns. It was our pleasure to serve you.  Durward Parcel, DO [facility], PGY-3

## 2017-12-16 NOTE — Assessment & Plan Note (Addendum)
Chronic.  Uncontrolled.  Uncertain etiology.  Patient does have multiple comorbidities including HFpEF, controlled diabetes.  He has no history of OSA however history does make this concerning. - Nocturnal polysomnogram ordered - See plan for normocytic anemia - Plan to fill out FMLA paperwork with instructions for patient to follow-up with neurology for reassessment on 01/08/2018 for work return

## 2017-12-16 NOTE — Progress Notes (Signed)
Subjective   Patient ID: Jeremiah Vance    DOB: [birth year], 47 y.o. male   MRN: [id number]  CC: "Need FMLA paperwork signed"  HPI: Jeremiah Vance is a 47 y.o. male who presents to clinic today for the following:  Diabetes: Patient is here today for routine follow-up concerning his diabetes which has historically been controlled without need for insulin.  He does not check his CBGs.  He is making efforts to avoid carbohydrates but does not exercise due to his multiple comorbidities including chronic fatigue.  He has an ophthalmology appointment scheduled next month.  Fatigue: Patient reports chronic fatigue that has been unchanged over the last month however patient's sister feels that this has been slowly worsening since before his surgery back in July.  Patient denies rectal bleeding, melena or hematochezia, hematuria, easy bruising.  He does report occasional nighttime awakenings and often feels like he does not get good rest.  He has no history of sleep apnea.  He does appear to be at his dry weight concerning his heart failure at 277 pounds.  He denies palpitations, syncope, chest pain or worsening shortness of breath.  He is currently working as a Production designer, theatre/television/film at a [employer] and has FMLA paperwork with him.  He feels that he cannot stand at work for the 10-12 hours straight and has a difficult time in the back due to the increased heat, particularly with the frying machines.  Hypertension: Patient reports good adherence to his medications.  He has been taking his lisinopril at 20 mg dose rather than cutting the pill in half as instructed since being discharged from the hospital back in July.  He is a non-smoker.  ROS: see HPI for pertinent.  PMFSH: DM, HFpEF, obesity,h/o CVA,gout, HTN, HLD.Surgical history hernia. Family history stroke, DM, heart disease. Smoking status reviewed. Medications reviewed.  Objective   BP 140/80   Pulse (!) 59   Temp 98.5 F (36.9 C)   Wt 277 lb  (125.6 kg)   SpO2 99%   BMI 38.63 kg/m  Vitals and nursing note reviewed.  General: well nourished, well developed, NAD with non-toxic appearance HEENT: normocephalic, atraumatic, moist mucous membranes Neck: supple, non-tender without lymphadenopathy, no JVD Cardiovascular: regular rate and rhythm without murmurs, rubs, or gallops Lungs: clear to auscultation bilaterally with normal work of breathing Abdomen: obese, large panus, soft, non-tender, non-distended, normoactive bowel sounds Skin: warm, dry, no rashes or lesions, cap refill < 2 seconds Extremities: warm and well perfused, normal tone, no edema, chronic hyperpigmentation on anterior shins bilaterally  Assessment & Plan   Type 2 diabetes mellitus with complication, without long-term current use of insulin (HCC) Chronic.  Well-controlled, A1c 5.4.  No history of hypoglycemia. - Continue metformin 500 mg twice daily - Given annual flu shot - RTC 3 months for next A1c  Normocytic anemia Chronic.  Onset around recent hospitalization earlier in the year for partial SBO.  Patient without signs of active bleeding.  Suspect this is anemia of chronic inflammation.  Patient not yet of age for screening colonoscopy and there are no indications for early screening. - Checking iron and TIBC with ferritin level  Chronic fatigue Chronic.  Uncontrolled.  Uncertain etiology.  Patient does have multiple comorbidities including HFpEF, controlled diabetes.  He has no history of OSA however history does make this concerning. - Nocturnal polysomnogram ordered - See plan for normocytic anemia  Primary hypertension Chronic.  Controlled.  Patient has been taking increased dose of lisinopril.  Last kidney function appears to be intact despite 2 months of use patient is without changes to urination. - Given refill for lisinopril 20 mg daily  Orders Placed This Encounter  Procedures  . Flu Vaccine QUAD 36+ mos IM  . Iron and TIBC  . Ferritin    . CBC with Differential/Platelet  . Ambulatory referral to Podiatry    Referral Priority:   Routine    Referral Type:   Consultation    Referral Reason:   Specialty Services Required    Requested Specialty:   Podiatry    Number of Visits Requested:   1  . HgB A1c  . Nocturnal polysomnography (NPSG)    Standing Status:   Future    Standing Expiration Date:   12/17/2018    Order Specific Question:   Where should this test be performed:    Answer:   [facility]   Meds ordered this encounter  Medications  . lisinopril (PRINIVIL,ZESTRIL) 20 MG tablet    Sig: Take 1 tablet (20 mg total) by mouth daily.    Dispense:  90 tablet    Refill:  3    Durward Parcel, DO [facility], PGY-3 12/16/2017, 5:34 PM

## 2017-12-16 NOTE — Assessment & Plan Note (Signed)
GAD-7 of 4.  PHQ-9 no core symptoms of depression over the last two weeks. His affect is consistent with his report. I do worry about what will happen long term with regards to work. 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 Vocational rehab might be a really nice match for him. Will let his PCP know.   No need to continue to follow for now. He can call if things change.

## 2017-12-17 LAB — IRON AND TIBC
Iron Saturation: 48 % (ref 15–55)
Iron: 113 ug/dL (ref 38–169)
TIBC: 236 ug/dL — AB (ref 250–450)
UIBC: 123 ug/dL (ref 111–343)

## 2017-12-17 LAB — CBC WITH DIFFERENTIAL/PLATELET
Basophils Absolute: 0.1 10*3/uL (ref 0.0–0.2)
Basos: 1 %
EOS (ABSOLUTE): 0.4 10*3/uL (ref 0.0–0.4)
EOS: 6 %
Hematocrit: 32.4 % — ABNORMAL LOW (ref 37.5–51.0)
Hemoglobin: 10.6 g/dL — ABNORMAL LOW (ref 13.0–17.7)
IMMATURE GRANULOCYTES: 0 %
Immature Grans (Abs): 0 10*3/uL (ref 0.0–0.1)
LYMPHS ABS: 2.7 10*3/uL (ref 0.7–3.1)
LYMPHS: 35 %
MCH: 29.6 pg (ref 26.6–33.0)
MCHC: 32.7 g/dL (ref 31.5–35.7)
MCV: 91 fL (ref 79–97)
MONOS ABS: 0.7 10*3/uL (ref 0.1–0.9)
Monocytes: 9 %
NEUTROS ABS: 3.9 10*3/uL (ref 1.4–7.0)
Neutrophils: 49 %
PLATELETS: 269 10*3/uL (ref 150–450)
RBC: 3.58 x10E6/uL — ABNORMAL LOW (ref 4.14–5.80)
RDW: 11.9 % — AB (ref 12.3–15.4)
WBC: 7.8 10*3/uL (ref 3.4–10.8)

## 2017-12-17 LAB — FERRITIN: FERRITIN: 332 ng/mL (ref 30–400)

## 2018-01-08 ENCOUNTER — Ambulatory Visit: Payer: Self-pay | Admitting: Adult Health

## 2018-01-22 ENCOUNTER — Ambulatory Visit (HOSPITAL_BASED_OUTPATIENT_CLINIC_OR_DEPARTMENT_OTHER): Payer: Self-pay | Attending: Family Medicine | Admitting: Internal Medicine

## 2018-01-22 VITALS — Ht 70.0 in | Wt 270.0 lb

## 2018-01-22 DIAGNOSIS — R5382 Chronic fatigue, unspecified: Secondary | ICD-10-CM

## 2018-01-22 DIAGNOSIS — G4733 Obstructive sleep apnea (adult) (pediatric): Secondary | ICD-10-CM | POA: Insufficient documentation

## 2018-01-22 DIAGNOSIS — G4736 Sleep related hypoventilation in conditions classified elsewhere: Secondary | ICD-10-CM | POA: Insufficient documentation

## 2018-02-04 ENCOUNTER — Encounter: Payer: Self-pay | Admitting: Adult Health

## 2018-02-04 ENCOUNTER — Ambulatory Visit: Payer: Self-pay | Admitting: Adult Health

## 2018-02-04 VITALS — BP 109/74 | HR 70 | Ht 71.0 in | Wt 303.4 lb

## 2018-02-04 DIAGNOSIS — I10 Essential (primary) hypertension: Secondary | ICD-10-CM

## 2018-02-04 DIAGNOSIS — E785 Hyperlipidemia, unspecified: Secondary | ICD-10-CM

## 2018-02-04 DIAGNOSIS — E118 Type 2 diabetes mellitus with unspecified complications: Secondary | ICD-10-CM

## 2018-02-04 DIAGNOSIS — I63532 Cerebral infarction due to unspecified occlusion or stenosis of left posterior cerebral artery: Secondary | ICD-10-CM

## 2018-02-04 NOTE — Progress Notes (Signed)
[facility] [address] [place]. [place] [address] [phone]       OFFICE FOLLOW UP NOTE  [name] Date of Birth:  Apr 29, 1970 Medical Record Number:  [id number]   Reason for Referral:  hospital stroke follow up  CHIEF COMPLAINT:  Chief Complaint  Patient presents with  . Follow-up    3 month follow up. Sister and nephew. Treatment room. Patient is anxious to get back to work.     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 History obtained from patient and chart review. Reviewed all radiology images and labs personally.  [name] is a 47 y.o. male with history of diabetes, hypertension, morbid obesity who presented on 09/08/17 and was diagnosed with small left internal capsule infarct but returned on 09/15/17 with right hemiparesis and stuttering speech.  CT head was negative for acute stroke.  MRI reviewed and showed punctate left PLIC infarct along with old bilateral BG and thalamic infarcts with advanced white matter disease.  CTA head and neck showed suboptimal CTA due to delayed timing significant venous contrast but no significant carotid or vertebral artery stenosis was seen.  2D echo showed an EF of 60 to 65% without cardiac source of embolus.  LDL 128 and as patient was not on statin PTA is recommended to start Lipitor 40 mg daily.  A1c 6.9.  No antithrombotic PTA and recommended DAPT for 3 weeks on aspirin alone.  Therapy recommended outpatient PT only and patient was discharged in stable condition.  10/08/2017 visit: Patient was seen today for hospital follow-up and is accompanied by his wife.  He states he continues to have right hemiparesis along with dysarthria but this has been improving.  He was able to participate in a few free therapy sessions due to financial reasons but currently does exercises at home.  He does continue to use a rolling walker  while he is out but does not use this while in home.  He has not returned to work where he works as a Production designer, theatre/television/film at [employer].  His PCP did fill out FMLA paperwork for this.  He continues to take both aspirin and Plavix without side effects of bleeding or bruising.  Continues to take Lipitor without side effects myalgias.  Blood pressure today 142/74.  Recently patient has been experiencing hypotension and his PCP has been managing medication management were recently lisinopril, metformin and spironolactone have all been discontinued.  Patient does have another follow-up appointment PCP on 10/14/2017.  Denies new or worsening stroke/TIA symptoms.  Interval history 02/04/2018: Patient is being seen today for follow-up visit and is accompanied by his sister and nephew.  He states he has been doing very well with complete improvement on right side without residual weakness and only intermittent dysarthria greater when he is attempting to speak too quickly.  He has been increasing his activity level where he walks 1 mile daily without need of assistive device.  He has recently underwent sleep study on 01/28/2018 but has not received results at this time.  Continues to take aspirin without side effects of bleeding or bruising.  Continues to take Lipitor without side effects myalgias.  Does monitor glucose levels at home with recent A1c 5.4 on 12/16/2017.  Blood pressure today satisfactory 109/64.  He is encouraged to return to work along with returning to driving.  He unfortunately has lost his prior job  at [employer] but plans on possibly looking into obtaining a job at a hotel.  No further concerns at this time.  Denies new or worsening stroke/TIA symptoms.    ROS:   14 system review of systems performed and negative with depression  PMH:  Past Medical History:  Diagnosis Date  . CHF (congestive heart failure) (HCC)    "coinsided w/kidney problems I was having 06/2017"  . CKD (chronic kidney disease) stage 3, GFR  30-59 ml/min (HCC) 06/2017  . High cholesterol   . History of cardiomyopathy    LVEF 40 to 45% in April 2019 - subsequently normalized  . Hypertension   . Ischemic stroke (HCC)    Small left internal capsule infarct due to lacunar disease  . Morbid obesity (HCC)   . Stroke (HCC) 08/2017   "right sided weakness since; getting stronger though" (10/22/2017)  . Type 2 diabetes mellitus (HCC)     PSH:  Past Surgical History:  Procedure Laterality Date  . ABDOMINAL HERNIA REPAIR  2008; 10/22/2017   "scope; OPEN REPAIR INCARCERATED VENTRAL HERNIA  . HERNIA REPAIR    . KNEE ARTHROSCOPY Right 1989  . VENTRAL HERNIA REPAIR N/A 10/22/2017   Procedure: OPEN REPAIR INCARCERATED VENTRAL HERNIA;  Surgeon: Glenna Fellows, MD;  Location: [facility] OR;  Service: General;  Laterality: N/A;    Social History:  Social History   Socioeconomic History  . Marital status: Married    Spouse name: Not on file  . Number of children: Not on file  . Years of education: Not on file  . Highest education level: Not on file  Occupational History  . Not on file  Social Needs  . Financial resource strain: Not on file  . Food insecurity:    Worry: Not on file    Inability: Not on file  . Transportation needs:    Medical: Not on file    Non-medical: Not on file  Tobacco Use  . Smoking status: Never Smoker  . Smokeless tobacco: Never Used  Substance and Sexual Activity  . Alcohol use: Not Currently    Frequency: Never    Comment: 10/22/2017 "sober since 2000"  . Drug use: Never  . Sexual activity: Not Currently  Lifestyle  . Physical activity:    Days per week: Not on file    Minutes per session: Not on file  . Stress: Not on file  Relationships  . Social connections:    Talks on phone: Not on file    Gets together: Not on file    Attends religious service: Not on file    Active member of club or organization: Not on file    Attends meetings of clubs or organizations: Not on file    Relationship  status: Not on file  . Intimate partner violence:    Fear of current or ex partner: Not on file    Emotionally abused: Not on file    Physically abused: Not on file    Forced sexual activity: Not on file  Other Topics Concern  . Not on file  Social History Narrative  . Not on file    Family History:  Family History  Problem Relation Age of Onset  . Diabetes Father   . Heart disease Father   . Diabetes Sister   . Diabetes Mother   . Stroke Mother   . Diabetes Maternal Grandmother   . Heart disease Maternal Grandmother   . Stroke Paternal Grandfather   . Heart disease Paternal  Grandfather     Medications:   Current Outpatient Medications on File Prior to Visit  Medication Sig Dispense Refill  . acetaminophen (TYLENOL) 500 MG tablet Take 500 mg by mouth every 6 (six) hours as needed for mild pain or moderate pain.    [name] Kitchen aspirin 81 MG chewable tablet Chew 1 tablet (81 mg total) by mouth daily. 30 tablet 0  . atorvastatin (LIPITOR) 40 MG tablet Take 1 tablet (40 mg total) by mouth daily at 6 PM. 90 tablet 3  . carvedilol (COREG) 6.25 MG tablet Take 1 tablet (6.25 mg total) by mouth 2 (two) times daily. 180 tablet 3  . lisinopril (PRINIVIL,ZESTRIL) 20 MG tablet Take 1 tablet (20 mg total) by mouth daily. 90 tablet 3  . metFORMIN (GLUCOPHAGE) 500 MG tablet Take 1 tablet (500 mg total) by mouth 2 (two) times daily with a meal. 180 tablet 3  . spironolactone (ALDACTONE) 25 MG tablet Take 1 tablet (25 mg total) by mouth daily. 30 tablet 3  . torsemide (DEMADEX) 20 MG tablet Take 2 tablets (40 mg total) by mouth daily. 60 tablet 4   No current facility-administered medications on file prior to visit.     Allergies:  No Known Allergies   Physical Exam  Vitals:   02/04/18 1103  BP: 109/74  Pulse: 70  Weight: (!) 303 lb 6.4 oz (137.6 kg)  Height: 5\' 11"  (1.803 m)   Body mass index is 42.32 kg/m. No exam data present  General: Obese pleasant middle-aged male, seated, in no  evident distress Head: head normocephalic and atraumatic.   Neck: supple with no carotid or supraclavicular bruits Cardiovascular: regular rate and rhythm, no murmurs Musculoskeletal: no deformity Skin:  no rash/petichiae Vascular:  Normal pulses all extremities  Neurologic Exam Mental Status: Awake and fully alert.  No speech impairment noted oriented to place and time. Recent and remote memory intact. Attention span, concentration and fund of knowledge appropriate. Mood and affect appropriate.  Cranial Nerves: Fundoscopic exam reveals sharp disc margins. Pupils equal, briskly reactive to light. Extraocular movements full without nystagmus. Visual fields full to confrontation. Hearing intact. Facial sensation intact. Face, tongue, palate moves normally and symmetrically.  Motor: Normal bulk and tone.  Full strength in all tested extremities Sensory.: intact to touch , pinprick , position and vibratory sensation.  Coordination: Rapid alternating movements normal in all extremities. Finger-to-nose and heel-to-shin performed accurately bilaterally.   Gait and Station: Arises from chair without difficulty. Stance is normal. Gait demonstrates normal stride length and balance  Reflexes: 1+ and symmetric. Toes downgoing.       Diagnostic Data (Labs, Imaging, Testing)  CT HEAD WO CONTRAST 09/14/17 IMPRESSION: 1. No acute intracranial abnormality 2. ASPECTS is 10 3. These results were called by telephone at the time of interpretation on 09/14/2017 at 9:06 am to Dr. Wilford Corner , who verbally acknowledged these results.  MR BRAIN WO CONTRAST 09/14/17 IMPRESSION: Acute infarct in the left thalamus, posterior basal ganglia, and lateral geniculate Moderate chronic microvascular ischemia. Chronic areas of chronic Microhemorrhage.  CT ANGIO NECK/HEAD W OR WO CONTRAST 09/08/17 IMPRESSION: Suboptimal CTA due to delayed timing and significant venous contrast. No significant carotid or vertebral  artery stenosis in the neck. No significant intracranial stenosis. Negative for large vessel occlusion. Small lung nodules on the right. CT chest recommended for further evaluation.  ECHOCARDIOGRAM 09/07/17 Study Conclusions - Left ventricle: The cavity size was moderately dilated. Wall   thickness was increased in a pattern of mild LVH.  Systolic   function was normal. The estimated ejection fraction was in the   range of 60% to 65%. - Mitral valve: Calcified annulus. Mildly thickened leaflets .   Valve area by pressure half-time: 1.4 cm^2.      ASSESSMENT: Jeremiah Vance is a 47 y.o. year old male here with left PLIC infarct on 09/14/17 secondary to small vessel disease. Vascular risk factors include HTN, HLD and DM.  Patient is being seen today for follow-up visit and overall has recovered well with only residual deficit of intermittent dysarthria.  PLAN: -Continue aspirin 81 mg daily  and Lipitor for secondary stroke prevention -F/u with PCP regarding your HLD, HTN and DM management -Patient cleared to return to work at this time as his intermittent dysarthria is mild and does not increase safety risk to return to the work environment.  He is also cleared to return to driving but did recommend writing with family member for the first few times and after cleared by them, can start driving independently to places that he knows, avoid main roads of highways and avoid nighttime driving for the time being.  Patient verbalized understanding. -f/u on sleep testing results for possible OSA with need of future management -continue to monitor BP at home -Encouraged to continue home exercises daily along with maintaining a healthy diet -Maintain strict control of hypertension with blood pressure goal below 130/90, diabetes with hemoglobin A1c goal below 6.5% and cholesterol with LDL cholesterol (bad cholesterol) goal below 70 mg/dL. I also advised the patient to eat a healthy diet with plenty of  whole grains, cereals, fruits and vegetables, exercise regularly and maintain ideal body weight.  Follow up in 6 months or call earlier if needed   Greater than 50% of time during this 25 minute visit was spent on counseling,explanation of diagnosis of left PLIC infarct, reviewing risk factor management of HTN, HLD and DM, planning of further management, discussion with patient and family and coordination of care   George Hugh, [facility]  [facility] [address] [place], [place] [address]  Phone [phone] Fax [phone]

## 2018-02-04 NOTE — Progress Notes (Signed)
I agree with the above plan 

## 2018-02-04 NOTE — Patient Instructions (Signed)
Continue aspirin 81 mg daily  and lipitor 80mg   for secondary stroke prevention  Continue to follow up with PCP regarding cholesterol, blood pressure and diabetes management   Follow up on sleep apnea testing for possible need of treatment   Continue to stay active with increasing exercise level and maintain a healthy diet  Continue to monitor blood pressure at home  Maintain strict control of hypertension with blood pressure goal below 130/90, diabetes with hemoglobin A1c goal below 6.5% and cholesterol with LDL cholesterol (bad cholesterol) goal below 70 mg/dL. I also advised the patient to eat a healthy diet with plenty of whole grains, cereals, fruits and vegetables, exercise regularly and maintain ideal body weight.  Followup in the future with me in 6 months or call earlier if needed       Thank you for coming to see [place] at [facility]. I hope we have been able to provide you high quality care today.  You may receive a patient satisfaction survey over the next few weeks. We would appreciate your feedback and comments so that we may continue to improve ourselves and the health of our patients.

## 2018-02-07 DIAGNOSIS — R5382 Chronic fatigue, unspecified: Secondary | ICD-10-CM

## 2018-02-07 NOTE — Procedures (Signed)
    Patient Name: Jeremiah Vance, Jeremiah Vance Date: 01/22/2018 Gender: Male D.O.B: 06/29/1970 Age (years): 47 Referring Provider: Westley Chandler Height (inches): 70 Interpreting Physician: Jetty Duhamel MD, ABSM Weight (lbs): 270 RPSGT: Lowry Ram BMI: 39 MRN: [id number] Neck Size: 16.50  CLINICAL INFORMATION Sleep Study Type: NPSG Indication for sleep study: Depression, Diabetes, Fatigue, Hypertension, Obesity, Snoring Epworth Sleepiness Score: 4  SLEEP STUDY TECHNIQUE As per the AASM Manual for the Scoring of Sleep and Associated Events v2.3 (April 2016) with a hypopnea requiring 4% desaturations.  The channels recorded and monitored were frontal, central and occipital EEG, electrooculogram (EOG), submentalis EMG (chin), nasal and oral airflow, thoracic and abdominal wall motion, anterior tibialis EMG, snore microphone, electrocardiogram, and pulse oximetry.  MEDICATIONS Medications self-administered by patient taken the night of the study : none reported  SLEEP ARCHITECTURE The study was initiated at 10:43:28 PM and ended at 5:26:16 AM.  Sleep onset time was 8.5 minutes and the sleep efficiency was 87.3%%. The total sleep time was 351.5 minutes.  Stage REM latency was 88.0 minutes.  The patient spent 5.8%% of the night in stage N1 sleep, 72.3%% in stage N2 sleep, 0.0%% in stage N3 and 21.9% in REM.  Alpha intrusion was absent.  Supine sleep was 100.00%.  RESPIRATORY PARAMETERS The overall apnea/hypopnea index (AHI) was 22.4 per hour. There were 15 total apneas, including 14 obstructive, 1 central and 0 mixed apneas. There were 116 hypopneas and 21 RERAs.  The AHI during Stage REM sleep was 66.2 per hour.  AHI while supine was 22.4 per hour.  The mean oxygen saturation was 91.0%. The minimum SpO2 during sleep was 59.0%.  moderate snoring was noted during this study.  CARDIAC DATA The 2 lead EKG demonstrated sinus rhythm. The mean heart rate was 58.7 beats per  minute. Other EKG findings include: PVCs..  LEG MOVEMENT DATA The total PLMS were 0 with a resulting PLMS index of 0.0. Associated arousal with leg movement index was 0.0 .  IMPRESSIONS - Moderate obstructive sleep apnea occurred during this study (AHI = 22.4/h). - No significant central sleep apnea occurred during this study (CAI = 0.2/h). - Oxygen desaturation was noted during this study (Min O2 = 59.0%). Mean 91.6%. Coughing was noted. - The patient snored with moderate snoring volume. - No cardiac abnormalities were noted during this study. - Clinically significant periodic limb movements did not occur during sleep. No significant associated arousals.  DIAGNOSIS - Obstructive Sleep Apnea (327.23 [G47.33 ICD-10]) - Nocturnal Hypoxemia (327.26 [G47.36 ICD-10])  RECOMMENDATIONS - Suggest CPAP titration sleep study or DME autopap. Other options would be based on clinical judgment. - Positional therapy avoiding supine position during sleep. - Be careful with alcohol, sedatives and other CNS depressants that may worsen sleep apnea and disrupt normal sleep architecture. - Sleep hygiene should be reviewed to assess factors that may improve sleep quality. - Weight management and regular exercise should be initiated or continued if appropriate.  [Electronically signed] 02/07/2018 10:57 AM  Jetty Duhamel MD, ABSM Diplomate, American Board of Sleep Medicine   NPI: [id number]                         Jetty Duhamel Diplomate, American Board of Sleep Medicine  ELECTRONICALLY SIGNED ON:  02/07/2018, 10:55 AM [facility] PH: [phone]   FX: [phone] ACCREDITED BY THE AMERICAN ACADEMY OF SLEEP MEDICINE

## 2018-02-11 ENCOUNTER — Encounter: Payer: Self-pay | Admitting: Family Medicine

## 2018-02-11 DIAGNOSIS — G4733 Obstructive sleep apnea (adult) (pediatric): Secondary | ICD-10-CM | POA: Insufficient documentation

## 2018-06-03 ENCOUNTER — Encounter: Payer: Self-pay | Admitting: Family Medicine

## 2018-06-03 ENCOUNTER — Other Ambulatory Visit: Payer: Self-pay

## 2018-06-03 ENCOUNTER — Ambulatory Visit (INDEPENDENT_AMBULATORY_CARE_PROVIDER_SITE_OTHER): Payer: Self-pay | Admitting: Family Medicine

## 2018-06-03 VITALS — BP 144/80 | HR 61 | Temp 98.7°F | Wt 327.8 lb

## 2018-06-03 DIAGNOSIS — H43393 Other vitreous opacities, bilateral: Secondary | ICD-10-CM | POA: Insufficient documentation

## 2018-06-03 DIAGNOSIS — I10 Essential (primary) hypertension: Secondary | ICD-10-CM

## 2018-06-03 DIAGNOSIS — E1165 Type 2 diabetes mellitus with hyperglycemia: Secondary | ICD-10-CM

## 2018-06-03 DIAGNOSIS — IMO0001 Reserved for inherently not codable concepts without codable children: Secondary | ICD-10-CM

## 2018-06-03 DIAGNOSIS — G4733 Obstructive sleep apnea (adult) (pediatric): Secondary | ICD-10-CM

## 2018-06-03 LAB — POCT GLYCOSYLATED HEMOGLOBIN (HGB A1C): HBA1C, POC (CONTROLLED DIABETIC RANGE): 7 % (ref 0.0–7.0)

## 2018-06-03 MED ORDER — METFORMIN HCL 1000 MG PO TABS: 1000.0000 mg | ORAL_TABLET | Freq: Two times a day (BID) | ORAL | 3 refills | Status: DC

## 2018-06-03 NOTE — Assessment & Plan Note (Signed)
Chronic.  Borderline control, A1c 7.0. - Increasing metformin to 1000 mg twice daily - Next A1c 3 months

## 2018-06-03 NOTE — Patient Instructions (Signed)
Thank you for coming in to see Jeremiah Vance today. Please see below to review our plan for today's visit.  1.  I would like you to take your blood pressure medications in the evening.  These include your spironolactone, lisinopril, and carvedilol.  It is important that you do not miss a dose each week.  I do believe your poorly controlled blood pressure is related to your eye issues.  I did place a referral for you to see an ophthalmologist.  Please give Jeremiah Vance a few weeks to get this set up.  Their office should contact you to schedule an appointment. 2.  You do have sleep apnea and would greatly benefit from CPAP.  Our nursing team will get in touch with you to get you set up. 3.  Please follow-up with Jeremiah Vance in 2 weeks to discuss your weight.  Please call the clinic at [phone] if your symptoms worsen or you have any concerns. It was our pleasure to serve you.  Durward Parcel, DO [facility], PGY-3

## 2018-06-03 NOTE — Assessment & Plan Note (Signed)
Chronic.  Likely related to poorly controlled BP.  Does have history of CVA.  No acute changes. - Ambulatory referral to ophthalmology - Reviewed return precautions

## 2018-06-03 NOTE — Assessment & Plan Note (Addendum)
Chronic.  Comorbidities include obesity, HTN, HFpEF, stroke, anemia.  Amenable to CPAP. - DME order for CPAP placed - Discussed importance of CPAP adherence

## 2018-06-03 NOTE — Progress Notes (Signed)
Subjective   Patient ID: Jeremiah Vance    DOB: [birth year], 48 y.o. male   MRN: [id number]  CC: "Diabetes follow-up"  HPI: Jeremiah Vance is a 48 y.o. male who presents to clinic today for the following:  Diabetes: [name] he has a history of poorly controlled diabetes with A1c in the 12th back in 2008.  More recently in the last year, his A1c's have been around 6.  He reports good adherence to his metformin but is only taking 1000 mg daily.  Eye floaters: Was seen by a optometrist in October at [facility] but instructed to follow-up with neurologist and PCP before receiving eye exam.  Patient reports having floaters in his eyes since his stroke.  He has no other changes to denies blurred vision, diplopia, eye pain, eye discharge.  Hypertension: Forgot to take antihypertensives today.  Usually takes antihypertensives in the morning.  Non-smoker.  History of stroke, recently seen back in November and cleared by neurology with recommendations for continued use of aspirin 81 mg daily and Lipitor.  Patient reports good adherence to his medications.  OSA: Recently diagnosed with moderate OSA based on sleep study back in October.  He has no prior history of CPAP use.  He is amenable to CPAP and his sister who is accompanying him also uses CPAP and will help him with his device.  Patient denies chest pain, shortness of breath, orthopnea, PND.  ROS: see HPI for pertinent.  PMFSH: DM, HFpEF, obesity,h/o CVA,gout, HTN, HLD.Surgical history hernia. Family history stroke, DM, heart disease. Smoking status reviewed. Medications reviewed.  Objective   BP (!) 144/80   Pulse 61   Temp 98.7 F (37.1 C) (Oral)   Wt (!) 327 lb 12.8 oz (148.7 kg)   SpO2 98%   BMI 45.72 kg/m  Vitals and nursing note reviewed.  General: well nourished, well developed, NAD with non-toxic appearance HEENT: normocephalic, atraumatic, moist mucous membranes, PERRLA, EOMI, no clear signs of papillary edema or cupping,  however this exam is limited Neck: supple, non-tender without lymphadenopathy Cardiovascular: regular rate and rhythm without murmurs, rubs, or gallops Lungs: clear to auscultation bilaterally with normal work of breathing Abdomen: obese, soft, non-tender, non-distended, normoactive bowel sounds Skin: warm, dry, no rashes or lesions, cap refill < 2 seconds Extremities: warm and well perfused, normal tone, no edema  Assessment & Plan   Moderate obstructive sleep apnea Chronic.  Comorbidities include obesity, HTN, HFpEF, stroke, anemia.  Amenable to CPAP. - DME order for CPAP placed - Discussed importance of CPAP adherence  Primary hypertension Chronic.  Suboptimal control.  Prior history of CVA. - Advised to administer antihypertensives during the evening - Continue lisinopril 20 mg daily, Spironolactone 25 mg daily, carvedilol 2.25 mg twice daily, and torsemide 40 mg daily - Continue aspirin 81 mg daily and Lipitor 40 mg daily  Vitreous floaters of both eyes Chronic.  Likely related to poorly controlled BP.  Does have history of CVA.  No acute changes. - Ambulatory referral to ophthalmology - Reviewed return precautions  Uncontrolled type 2 diabetes mellitus without complication, without long-term current use of insulin (HCC) Chronic.  Borderline control, A1c 7.0. - Increasing metformin to 1000 mg twice daily - Next A1c 3 months  Orders Placed This Encounter  Procedures  . For home use only DME continuous positive airway pressure (CPAP)    Order Specific Question:   Patient has OSA or probable OSA    Answer:   Yes    Order Specific  Question:   Settings    Answer:   Autotitration    Order Specific Question:   CPAP supplies needed    Answer:   Mask, headgear, cushions, filters, heated tubing and water chamber  . Ambulatory referral to Ophthalmology    Referral Priority:   Routine    Referral Type:   Consultation    Referral Reason:   Specialty Services Required    Requested  Specialty:   Ophthalmology    Number of Visits Requested:   1  . HgB A1c   Meds ordered this encounter  Medications  . metFORMIN (GLUCOPHAGE) 1000 MG tablet    Sig: Take 1 tablet (1,000 mg total) by mouth 2 (two) times daily with a meal.    Dispense:  180 tablet    Refill:  3    Jeremiah Parcel, DO [facility], PGY-3 06/03/2018, 2:08 PM

## 2018-06-03 NOTE — Assessment & Plan Note (Signed)
Chronic.  Suboptimal control.  Prior history of CVA. - Advised to administer antihypertensives during the evening - Continue lisinopril 20 mg daily, Spironolactone 25 mg daily, carvedilol 2.25 mg twice daily, and torsemide 40 mg daily - Continue aspirin 81 mg daily and Lipitor 40 mg daily

## 2018-06-17 ENCOUNTER — Ambulatory Visit (INDEPENDENT_AMBULATORY_CARE_PROVIDER_SITE_OTHER): Payer: Self-pay | Admitting: Family Medicine

## 2018-06-17 ENCOUNTER — Other Ambulatory Visit: Payer: Self-pay

## 2018-06-17 VITALS — BP 120/80 | HR 64 | Temp 98.4°F | Wt 323.6 lb

## 2018-06-17 DIAGNOSIS — IMO0001 Reserved for inherently not codable concepts without codable children: Secondary | ICD-10-CM

## 2018-06-17 DIAGNOSIS — E1165 Type 2 diabetes mellitus with hyperglycemia: Secondary | ICD-10-CM

## 2018-06-17 DIAGNOSIS — G4733 Obstructive sleep apnea (adult) (pediatric): Secondary | ICD-10-CM

## 2018-06-17 NOTE — Progress Notes (Signed)
   Subjective   Patient ID: Jeremiah Vance    DOB: 07/29/1970, 48 y.o. male   MRN: [id number]  CC: "Weight gain"  HPI: Jeremiah Vance is a 48 y.o. male who presents to clinic today for the following:   Weight gain: Mr. [name] has approximately 50 pound weight gain over the last 6 months.  Patient attributes this to poor eating habits since his stroke back in June.  He also has feelings of sadness since his wife left him following the stroke.  He denies any depressive symptoms.  He spends the majority the day with his sister who is accompanying him.  He is currently unemployed since being released from his prior employer for "not being able to stay focused."  Patient denies history of chest pain, shortness of breath, orthopnea, lower extremity swelling.   Diabetes: History of poor control several years ago with recent control on metformin alone.  No prior history of insulin use over the last year.  Denies polyuria or polydipsia.  Has been eating more since stroke as stated above.   OSA: Diagnosed based on sleep study in October.  Working towards getting CPAP established.    ROS: see HPI for pertinent.  PMFSH: DM, HFpEF, obesity,h/o CVA,gout, HTN, HLD.Surgical history hernia. Family history stroke, DM, heart disease. Smoking status reviewed. Medications reviewed.  Objective   BP 120/80   Pulse 64   Temp 98.4 F (36.9 C) (Oral)   Wt (!) 323 lb 9.6 oz (146.8 kg)   SpO2 97%   BMI 45.13 kg/m  Vitals and nursing note reviewed.  General: well nourished, well developed, NAD with non-toxic appearance HEENT: normocephalic, atraumatic, moist mucous membranes Neck: supple, non-tender without lymphadenopathy Cardiovascular: regular rate and rhythm without murmurs, rubs, or gallops Lungs: clear to auscultation bilaterally with normal work of breathing Abdomen: obese, soft, non-tender, non-distended, normoactive bowel sounds Skin: warm, dry, no rashes or lesions, cap refill < 2 seconds  Extremities: warm and well perfused, normal tone, no edema  Assessment & Plan   Moderate obstructive sleep apnea Chronic.  Not he is set up for CPAP. - Advised to contact [facility] to resubmit form in order to receive CPAP  Obesity, Class III, BMI 40-49.9 (morbid obesity) (HCC) Worsening over the last 6 months likely due to poor eating habits and sedentary lifestyle following CVA back in June.  PHQ 9 score 3, not difficult at all making depression unlikely.  Not consistent with fluid overload. - Discussed importance of caloric restriction and given smart goals - RTC 3 months  Uncontrolled type 2 diabetes mellitus without complication, without long-term current use of insulin (HCC) Chronic.  A1c 7.0 approximately 3 weeks ago. - Checking routine diabetic labs including CBC, CMAC, TSH, lipid panel, microalbumin - Next A1c due in 3 months  Orders Placed This Encounter  Procedures  . Comprehensive metabolic panel    Order Specific Question:   Has the patient fasted?    Answer:   No  . TSH  . Lipid Panel    Order Specific Question:   Has the patient fasted?    Answer:   No  . CBC   No orders of the defined types were placed in this encounter.   Durward Parcel, DO [facility], PGY-3 06/17/2018, 5:03 PM

## 2018-06-17 NOTE — Assessment & Plan Note (Addendum)
Worsening over the last 6 months likely due to poor eating habits and sedentary lifestyle following CVA back in June.  PHQ 9 score 3, not difficult at all making depression unlikely.  Not consistent with fluid overload. - Discussed importance of caloric restriction and given smart goals - RTC 3 months

## 2018-06-17 NOTE — Assessment & Plan Note (Signed)
Chronic.  A1c 7.0 approximately 3 weeks ago. - Checking routine diabetic labs including CBC, CMAC, TSH, lipid panel, microalbumin - Next A1c due in 3 months

## 2018-06-17 NOTE — Assessment & Plan Note (Signed)
Chronic.  Not he is set up for CPAP. - Advised to contact [facility] to resubmit form in order to receive CPAP

## 2018-06-17 NOTE — Patient Instructions (Signed)
Thank you for coming in to see [place] today. Please see below to review our plan for today's visit.  1.  We will shoot for a 20 pound weight loss over the next 3 months.  To achieve this goal, is work together to do the following: - Avoid sugary beverages except for 1 day out of the week, these include soda, ice tea, juice, and alcohol - Limit eating out to no more than 3 times in the week - Cooked during the remaining meals and avoid eating premade frozen foods or canned foods as much as possible, consider fresh options instead - Try make it a goal to be outside for at least 1 hour every day 2.  Concerning her shoulder, we can consider an injection in the future once we can get you set up for physical therapy. 3.  Contact [facility] to arrange for your CPAP. 4.  Follow-up with your cardiologist.  Please call the clinic at [phone] if your symptoms worsen or you have any concerns. It was our pleasure to serve you.  Durward Parcel, DO [facility], PGY-3

## 2018-06-18 ENCOUNTER — Telehealth: Payer: Self-pay | Admitting: Family Medicine

## 2018-06-18 ENCOUNTER — Other Ambulatory Visit: Payer: Self-pay | Admitting: Family Medicine

## 2018-06-18 DIAGNOSIS — E875 Hyperkalemia: Secondary | ICD-10-CM

## 2018-06-18 LAB — COMPREHENSIVE METABOLIC PANEL
A/G RATIO: 1.4 (ref 1.2–2.2)
ALK PHOS: 127 IU/L — AB (ref 39–117)
ALT: 18 IU/L (ref 0–44)
AST: 18 IU/L (ref 0–40)
Albumin: 4.2 g/dL (ref 4.0–5.0)
BUN/Creatinine Ratio: 26 — ABNORMAL HIGH (ref 9–20)
BUN: 49 mg/dL — ABNORMAL HIGH (ref 6–24)
Bilirubin Total: 0.6 mg/dL (ref 0.0–1.2)
CALCIUM: 9.6 mg/dL (ref 8.7–10.2)
CO2: 28 mmol/L (ref 20–29)
Chloride: 100 mmol/L (ref 96–106)
Creatinine, Ser: 1.87 mg/dL — ABNORMAL HIGH (ref 0.76–1.27)
GFR calc Af Amer: 48 mL/min/{1.73_m2} — ABNORMAL LOW (ref >59)
GFR, EST NON AFRICAN AMERICAN: 42 mL/min/{1.73_m2} — AB (ref >59)
Globulin, Total: 3 g/dL (ref 1.5–4.5)
Glucose: 123 mg/dL — ABNORMAL HIGH (ref 65–99)
POTASSIUM: 5.3 mmol/L — AB (ref 3.5–5.2)
Sodium: 142 mmol/L (ref 134–144)
Total Protein: 7.2 g/dL (ref 6.0–8.5)

## 2018-06-18 LAB — CBC
Hematocrit: 34.9 % — ABNORMAL LOW (ref 37.5–51.0)
Hemoglobin: 12 g/dL — ABNORMAL LOW (ref 13.0–17.7)
MCH: 29.3 pg (ref 26.6–33.0)
MCHC: 34.4 g/dL (ref 31.5–35.7)
MCV: 85 fL (ref 79–97)
PLATELETS: 296 10*3/uL (ref 150–450)
RBC: 4.09 x10E6/uL — AB (ref 4.14–5.80)
RDW: 12.4 % (ref 11.6–15.4)
WBC: 8.3 10*3/uL (ref 3.4–10.8)

## 2018-06-18 LAB — LIPID PANEL
CHOLESTEROL TOTAL: 115 mg/dL (ref 100–199)
Chol/HDL Ratio: 2.8 ratio (ref 0.0–5.0)
HDL: 41 mg/dL (ref >39)
LDL Calculated: 53 mg/dL (ref 0–99)
Triglycerides: 103 mg/dL (ref 0–149)
VLDL CHOLESTEROL CAL: 21 mg/dL (ref 5–40)

## 2018-06-18 LAB — TSH: TSH: 1.09 u[IU]/mL (ref 0.450–4.500)

## 2018-06-18 NOTE — Telephone Encounter (Signed)
Error

## 2018-06-18 NOTE — Telephone Encounter (Signed)
Pt called nurse line returning PCP call. I informed him of results and need for recheck. Pt will call his transportation and call us back for his lab apt. I did tell him Monday preferred by PCP. Medication management will be done based on lab results.

## 2018-06-18 NOTE — Telephone Encounter (Signed)
Attempted to contact patient regarding labs from 06/17/2018.  No answer, HIPPA compliant VM requesting patient call clinic back.    Need to recheck kidney function on Monday, was worse than last visit.  Future order placed in lab appointment made.  Patient can come in at any time during the morning of 06/22/2018 to have his kidney function checked.  I will call him back to determine if he needs to change any of his medications based on the results.  Jeremiah Parcel, DO [facility], PGY-3

## 2018-06-18 NOTE — Progress Notes (Signed)
b,

## 2018-06-22 ENCOUNTER — Other Ambulatory Visit: Payer: Medicaid Other

## 2018-06-23 ENCOUNTER — Other Ambulatory Visit: Payer: Medicaid Other

## 2018-06-23 ENCOUNTER — Other Ambulatory Visit: Payer: Self-pay

## 2018-06-23 DIAGNOSIS — E875 Hyperkalemia: Secondary | ICD-10-CM

## 2018-06-24 LAB — BASIC METABOLIC PANEL
BUN/Creatinine Ratio: 16 (ref 9–20)
BUN: 21 mg/dL (ref 6–24)
CALCIUM: 9.6 mg/dL (ref 8.7–10.2)
CHLORIDE: 101 mmol/L (ref 96–106)
CO2: 24 mmol/L (ref 20–29)
Creatinine, Ser: 1.33 mg/dL — ABNORMAL HIGH (ref 0.76–1.27)
GFR calc Af Amer: 73 mL/min/{1.73_m2} (ref >59)
GFR calc non Af Amer: 63 mL/min/{1.73_m2} (ref >59)
Glucose: 165 mg/dL — ABNORMAL HIGH (ref 65–99)
POTASSIUM: 5.3 mmol/L — AB (ref 3.5–5.2)
Sodium: 139 mmol/L (ref 134–144)

## 2018-07-01 ENCOUNTER — Encounter: Payer: Self-pay | Admitting: Family Medicine

## 2018-07-16 ENCOUNTER — Other Ambulatory Visit: Payer: Self-pay | Admitting: Family Medicine

## 2018-07-16 DIAGNOSIS — I10 Essential (primary) hypertension: Secondary | ICD-10-CM

## 2018-07-16 DIAGNOSIS — I5032 Chronic diastolic (congestive) heart failure: Secondary | ICD-10-CM

## 2018-07-17 ENCOUNTER — Other Ambulatory Visit: Payer: Self-pay | Admitting: Family Medicine

## 2018-07-17 DIAGNOSIS — E875 Hyperkalemia: Secondary | ICD-10-CM

## 2018-07-17 NOTE — Telephone Encounter (Signed)
Patient will need potassium and kidney function checked before refill. He can come to clinic for test. Future order placed. Please advise him to come in next week at an early time.  Durward Parcel, DO [facility], PGY-3

## 2018-07-20 NOTE — Telephone Encounter (Signed)
Correction, patient was seen by me last month and was taking medications.  We will go ahead and refill as requested.  Patient plans to follow-up in June.  Durward Parcel, DO [facility], PGY-3

## 2018-08-04 ENCOUNTER — Telehealth: Payer: Self-pay | Admitting: *Deleted

## 2018-08-04 NOTE — Telephone Encounter (Signed)
LMVM for pt to return call re: appt scheduled tomorrow at 1245.  Due to current COVID 19 pandemic, our office is severely reducing in office visits until further notice, in order to minimize the risk to our patients and healthcare providers.  Converting appts to video visits.

## 2018-08-05 ENCOUNTER — Other Ambulatory Visit: Payer: Self-pay

## 2018-08-05 ENCOUNTER — Ambulatory Visit: Payer: Self-pay | Admitting: Adult Health

## 2018-08-05 NOTE — Telephone Encounter (Signed)
VM was left 07/30/2018 by Theodoro Doing, 08/03/2018 by Dois Davenport, and 08/05/2018 by Teyonna Plaisted.  VM was left for patient again that due to COVID 19 his visit will be change to video. I stated we need verbal consent to do video and to file insurance. Also we need text him the link to his cell phone for the visit 5 to 10 minutes prior. We need verbal consent to file insurance and do video visit.

## 2018-08-06 ENCOUNTER — Encounter (HOSPITAL_COMMUNITY): Payer: Self-pay | Admitting: Emergency Medicine

## 2018-08-06 ENCOUNTER — Emergency Department (HOSPITAL_COMMUNITY): Payer: Self-pay

## 2018-08-06 ENCOUNTER — Other Ambulatory Visit: Payer: Self-pay

## 2018-08-06 ENCOUNTER — Emergency Department (HOSPITAL_COMMUNITY)
Admission: EM | Admit: 2018-08-06 | Discharge: 2018-08-06 | Disposition: A | Discharge status: Home / Self Care | Payer: Self-pay | Attending: Emergency Medicine | Admitting: Emergency Medicine

## 2018-08-06 DIAGNOSIS — E1122 Type 2 diabetes mellitus with diabetic chronic kidney disease: Secondary | ICD-10-CM | POA: Insufficient documentation

## 2018-08-06 DIAGNOSIS — R609 Edema, unspecified: Secondary | ICD-10-CM | POA: Insufficient documentation

## 2018-08-06 DIAGNOSIS — R55 Syncope and collapse: Secondary | ICD-10-CM

## 2018-08-06 DIAGNOSIS — I639 Cerebral infarction, unspecified: Secondary | ICD-10-CM

## 2018-08-06 DIAGNOSIS — R42 Dizziness and giddiness: Secondary | ICD-10-CM | POA: Insufficient documentation

## 2018-08-06 DIAGNOSIS — Z79899 Other long term (current) drug therapy: Secondary | ICD-10-CM | POA: Insufficient documentation

## 2018-08-06 DIAGNOSIS — N183 Chronic kidney disease, stage 3 (moderate): Secondary | ICD-10-CM | POA: Insufficient documentation

## 2018-08-06 DIAGNOSIS — I509 Heart failure, unspecified: Secondary | ICD-10-CM | POA: Insufficient documentation

## 2018-08-06 DIAGNOSIS — Z7984 Long term (current) use of oral hypoglycemic drugs: Secondary | ICD-10-CM | POA: Insufficient documentation

## 2018-08-06 DIAGNOSIS — Z8673 Personal history of transient ischemic attack (TIA), and cerebral infarction without residual deficits: Secondary | ICD-10-CM | POA: Insufficient documentation

## 2018-08-06 DIAGNOSIS — I130 Hypertensive heart and chronic kidney disease with heart failure and stage 1 through stage 4 chronic kidney disease, or unspecified chronic kidney disease: Secondary | ICD-10-CM | POA: Insufficient documentation

## 2018-08-06 LAB — PROTIME-INR
INR: 1.1 (ref 0.8–1.2)
Prothrombin Time: 13.8 seconds (ref 11.4–15.2)

## 2018-08-06 LAB — COMPREHENSIVE METABOLIC PANEL
ALT: 19 U/L (ref 0–44)
AST: 19 U/L (ref 15–41)
Albumin: 3.5 g/dL (ref 3.5–5.0)
Alkaline Phosphatase: 99 U/L (ref 38–126)
Anion gap: 13 (ref 5–15)
BUN: 38 mg/dL — ABNORMAL HIGH (ref 6–20)
CO2: 24 mmol/L (ref 22–32)
Calcium: 9.1 mg/dL (ref 8.9–10.3)
Chloride: 103 mmol/L (ref 98–111)
Creatinine, Ser: 1.91 mg/dL — ABNORMAL HIGH (ref 0.61–1.24)
GFR calc Af Amer: 47 mL/min — ABNORMAL LOW (ref >60)
GFR calc non Af Amer: 41 mL/min — ABNORMAL LOW (ref >60)
Glucose, Bld: 155 mg/dL — ABNORMAL HIGH (ref 70–99)
Potassium: 4.8 mmol/L (ref 3.5–5.1)
Sodium: 140 mmol/L (ref 135–145)
Total Bilirubin: 0.9 mg/dL (ref 0.3–1.2)
Total Protein: 7 g/dL (ref 6.5–8.1)

## 2018-08-06 LAB — DIFFERENTIAL
Abs Immature Granulocytes: 0.03 10*3/uL (ref 0.00–0.07)
Basophils Absolute: 0.1 10*3/uL (ref 0.0–0.1)
Basophils Relative: 1 %
Eosinophils Absolute: 0.4 10*3/uL (ref 0.0–0.5)
Eosinophils Relative: 4 %
Immature Granulocytes: 0 %
Lymphocytes Relative: 26 %
Lymphs Abs: 2.5 10*3/uL (ref 0.7–4.0)
Monocytes Absolute: 0.8 10*3/uL (ref 0.1–1.0)
Monocytes Relative: 8 %
Neutro Abs: 6.1 10*3/uL (ref 1.7–7.7)
Neutrophils Relative %: 61 %

## 2018-08-06 LAB — CBC
HCT: 38.2 % — ABNORMAL LOW (ref 39.0–52.0)
Hemoglobin: 12.2 g/dL — ABNORMAL LOW (ref 13.0–17.0)
MCH: 29.4 pg (ref 26.0–34.0)
MCHC: 31.9 g/dL (ref 30.0–36.0)
MCV: 92 fL (ref 80.0–100.0)
Platelets: 325 10*3/uL (ref 150–400)
RBC: 4.15 MIL/uL — ABNORMAL LOW (ref 4.22–5.81)
RDW: 13.2 % (ref 11.5–15.5)
WBC: 9.8 10*3/uL (ref 4.0–10.5)
nRBC: 0 % (ref 0.0–0.2)

## 2018-08-06 LAB — APTT: aPTT: 30 seconds (ref 24–36)

## 2018-08-06 LAB — I-STAT TROPONIN, ED: Troponin i, poc: 0.02 ng/mL (ref 0.00–0.08)

## 2018-08-06 LAB — CBG MONITORING, ED: Glucose-Capillary: 145 mg/dL — ABNORMAL HIGH (ref 70–99)

## 2018-08-06 LAB — I-STAT CREATININE, ED: Creatinine, Ser: 2 mg/dL — ABNORMAL HIGH (ref 0.61–1.24)

## 2018-08-06 MED ORDER — SODIUM CHLORIDE 0.9% FLUSH
3.0000 mL | Freq: Once | INTRAVENOUS | Status: AC
  Administered 2018-08-06: 3 mL via INTRAVENOUS

## 2018-08-06 MED ORDER — SODIUM CHLORIDE 0.9 % IV BOLUS
1000.0000 mL | Freq: Once | INTRAVENOUS | Status: AC
  Administered 2018-08-06: 1000 mL via INTRAVENOUS

## 2018-08-06 NOTE — ED Notes (Addendum)
Pt got up and out of bed on their own. Pt ambulated down the hallway slowly and stated that he felt dizzy and lightheaded on multiple occasions. Will return pt to bed and finish IV fluid bolus then attempt to walk pt again.

## 2018-08-06 NOTE — ED Triage Notes (Signed)
Pt arrives to ED from work as a [employer] with [employer] with complaints of right sided weakness, right sided numbness, and difficulty finding words. Pt has had stroke in past with these same symptoms. Pt met at bridge with this RN, EMT, EDP, and neurologist.

## 2018-08-06 NOTE — ED Notes (Signed)
Pt transferred to MRI with this RN from CT

## 2018-08-06 NOTE — Discharge Instructions (Addendum)
Drink plenty of fluids and get plenty of rest.  Continue your medications as previously prescribed.   Follow-up with your primary doctor if symptoms or not improving in the next few days, and return to the ER if symptoms significantly worsen or change.

## 2018-08-06 NOTE — ED Notes (Signed)
Pt ambulated to restroom and back to pt room with steady gait. Notified [place](RN)

## 2018-08-06 NOTE — ED Notes (Signed)
Sister crystal jones would also liked to be called with updates [phone]

## 2018-08-06 NOTE — ED Provider Notes (Signed)
[facility] Provider Note   CSN: [id number] Arrival date & time: 08/06/18  1332  An emergency department physician performed an initial assessment on this suspected stroke patient at 1335.  History   Chief Complaint Chief Complaint  Patient presents with   Code Stroke    HPI Jeremiah Vance is a 48 y.o. male.     Patient is a 48 year old male with past medical history of congestive heart failure, chronic renal insufficiency, cardiomyopathy, hypertension, obesity, diabetes and prior CVA.  Patient presents for evaluation of dizziness and near syncope.  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  He was doing this for approximately 2 hours, then began to feel dizzy and lightheaded and as if he was going to pass out.  Patient then sat down in the break room and rested.  He was then brought here by EMS for evaluation.  Due to his history of stroke, a code stroke was called and the patient was transported here.  The history is provided by the patient.    Past Medical History:  Diagnosis Date   CHF (congestive heart failure) (HCC)    "coinsided w/kidney problems I was having 06/2017"   CKD (chronic kidney disease) stage 3, GFR 30-59 ml/min (HCC) 06/2017   High cholesterol    History of cardiomyopathy    LVEF 40 to 45% in April 2019 - subsequently normalized   Hypertension    Ischemic stroke (HCC)    Small left internal capsule infarct due to lacunar disease   Morbid obesity (HCC)    Stroke (HCC) 08/2017   "right sided weakness since; getting stronger though" (10/22/2017)   Type 2 diabetes mellitus (HCC)     Patient Active Problem List   Diagnosis Date Noted   Vitreous floaters of both eyes 06/03/2018   Moderate obstructive sleep apnea 02/11/2018   Normocytic anemia 12/16/2017   Chronic fatigue 12/16/2017   Primary hypertension 12/16/2017   History of small  bowel obstruction 10/24/2017   Recurrent incisional hernia with incarceration s/p repair 10/22/2017 10/21/2017   Depression 10/14/2017   Adjustment disorder with depressed mood 09/30/2017   Stroke (HCC) 09/14/2017   Acute right-sided weakness    CHF (congestive heart failure) (HCC)    Hyperlipidemia    Stroke (cerebrum) (HCC) 09/07/2017   Gout    Acute ischemic stroke (HCC) 09/06/2017   Anasarca    Elevated LFTs    (HFpEF) heart failure with preserved ejection fraction (HCC)    Obesity, Class III, BMI 40-49.9 (morbid obesity) (HCC)    Hypoalbuminemia 07/11/2017   Hyperbilirubinemia 07/10/2017   Anemia 07/10/2017   Uncontrolled type 2 diabetes mellitus without complication, without long-term current use of insulin (HCC) 07/10/2017    Past Surgical History:  Procedure Laterality Date   ABDOMINAL HERNIA REPAIR  2008; 10/22/2017   "scope; OPEN REPAIR INCARCERATED VENTRAL HERNIA   HERNIA REPAIR     KNEE ARTHROSCOPY Right 1989   VENTRAL HERNIA REPAIR N/A 10/22/2017   Procedure: OPEN REPAIR INCARCERATED VENTRAL HERNIA;  Surgeon: Glenna FellowsHoxworth, Benjamin, MD;  Location: [facility] OR;  Service: General;  Laterality: N/A;        Home Medications    Prior to Admission medications   Medication Sig Start Date End Date Taking? Authorizing Provider  acetaminophen (TYLENOL) 500 MG tablet Take 500 mg by mouth every 6 (six) hours as needed for mild pain or moderate pain.  [provider]  aspirin 81 MG chewable tablet Chew 1 tablet (81 mg total) by mouth daily. 09/09/17   Myrene Buddy, MD  atorvastatin (LIPITOR) 40 MG tablet Take 1 tablet (40 mg total) by mouth daily at 6 PM. 11/18/17   Wendee Beavers, DO  carvedilol (COREG) 6.25 MG tablet Take 1 tablet (6.25 mg total) by mouth 2 (two) times daily. 11/18/17 11/18/18  Wendee Beavers, DO  lisinopril (ZESTRIL) 20 MG tablet TAKE ONE-HALF TABLET BY MOUTH DAILY Patient taking differently: Take 10 mg by mouth daily.  07/20/18    Wendee Beavers, DO  metFORMIN (GLUCOPHAGE) 1000 MG tablet Take 1 tablet (1,000 mg total) by mouth 2 (two) times daily with a meal. Patient taking differently: Take 500 mg by mouth 2 (two) times daily with a meal.  06/03/18   Wendee Beavers, DO  spironolactone (ALDACTONE) 25 MG tablet TAKE ONE TABLET BY MOUTH DAILY Patient taking differently: Take 25 mg by mouth daily.  07/20/18   Wendee Beavers, DO  torsemide (DEMADEX) 20 MG tablet Take 2 tablets (40 mg total) by mouth daily. 11/18/17   Wendee Beavers, DO    Family History Family History  Problem Relation Age of Onset   Diabetes Father    Heart disease Father    Diabetes Sister    Diabetes Mother    Stroke Mother    Diabetes Maternal Grandmother    Heart disease Maternal Grandmother    Stroke Paternal Grandfather    Heart disease Paternal Grandfather     Social History Social History   Tobacco Use   Smoking status: Never Smoker   Smokeless tobacco: Never Used  Substance Use Topics   Alcohol use: Not Currently    Frequency: Never    Comment: 10/22/2017 "sober since 2000"   Drug use: Never     Allergies   Patient has no known allergies.   Review of Systems Review of Systems  All other systems reviewed and are negative.    Physical Exam Updated Vital Signs BP (!) 147/78    Pulse (!) 59    Temp 97.9 F (36.6 C) (Oral)    Resp (!) 26    Ht 5\' 11"  (1.803 m)    Wt (!) 151 kg    SpO2 97%    BMI 46.43 kg/m   Physical Exam Vitals signs and nursing note reviewed.  Constitutional:      General: He is not in acute distress.    Appearance: He is well-developed. He is not diaphoretic.  HENT:     Head: Normocephalic and atraumatic.     Nose: Nose normal.     Mouth/Throat:     Mouth: Mucous membranes are moist.  Eyes:     Extraocular Movements: Extraocular movements intact.     Pupils: Pupils are equal, round, and reactive to light.  Neck:     Musculoskeletal: Normal range of motion and neck  supple.  Cardiovascular:     Rate and Rhythm: Normal rate and regular rhythm.     Heart sounds: No murmur. No friction rub.  Pulmonary:     Effort: Pulmonary effort is normal. No respiratory distress.     Breath sounds: Normal breath sounds. No wheezing or rales.  Abdominal:     General: Bowel sounds are normal. There is no distension.     Palpations: Abdomen is soft.     Tenderness: There is no abdominal tenderness.  Musculoskeletal: Normal range of motion.  Right lower leg: Edema present.     Left lower leg: Edema present.  Skin:    General: Skin is warm and dry.  Neurological:     General: No focal deficit present.     Mental Status: He is alert and oriented to person, place, and time.     Cranial Nerves: No cranial nerve deficit.     Sensory: No sensory deficit.     Coordination: Coordination normal.      ED Treatments / Results  Labs (all labs ordered are listed, but only abnormal results are displayed) Labs Reviewed  CBC - Abnormal; Notable for the following components:      Result Value   RBC 4.15 (*)    Hemoglobin 12.2 (*)    HCT 38.2 (*)    All other components within normal limits  COMPREHENSIVE METABOLIC PANEL - Abnormal; Notable for the following components:   Glucose, Bld 155 (*)    BUN 38 (*)    Creatinine, Ser 1.91 (*)    GFR calc non Af Amer 41 (*)    GFR calc Af Amer 47 (*)    All other components within normal limits  CBG MONITORING, ED - Abnormal; Notable for the following components:   Glucose-Capillary 145 (*)    All other components within normal limits  I-STAT CREATININE, ED - Abnormal; Notable for the following components:   Creatinine, Ser 2.00 (*)    All other components within normal limits  PROTIME-INR  APTT  DIFFERENTIAL  CBG MONITORING, ED    EKG EKG Interpretation  Date/Time:  Thursday Aug 06 2018 14:31:03 EDT Ventricular Rate:  60 PR Interval:    QRS Duration: 96 QT Interval:  460 QTC Calculation: 460 R  Axis:   70 Text Interpretation:  Sinus rhythm Repol abnrm suggests ischemia, inferior leads Borderline ST elevation, anterolateral leads Confirmed by Geoffery Lyons ([id number]) on 08/06/2018 7:27:17 PM   Radiology Mr Maxine Glenn Head Wo Contrast  Result Date: 08/06/2018 CLINICAL DATA:  Word-finding difficulty and right-sided weakness. EXAM: MRI HEAD WITHOUT CONTRAST MRA HEAD WITHOUT CONTRAST TECHNIQUE: Multiplanar, multiecho pulse sequences of the brain and surrounding structures were obtained without intravenous contrast. Angiographic images of the head were obtained using MRA technique without contrast. COMPARISON:  Head CT 08/06/2018. Head MRI 09/15/2017 and 09/14/2017. Head and neck CTA 09/08/2017. FINDINGS: MRI HEAD FINDINGS Brain: No acute infarct, mass, midline shift, or extra-axial fluid collection is identified. Chronic microhemorrhages are again seen in the left putamen, thalami, right parietal lobe, along the lateral margin of the atrium of the left lateral ventricle, and in the lower dorsal medulla. A chronic microhemorrhage in the anterolateral right occipital was not clearly present on the prior MRI. A chronic lacunar infarct in the periventricular white matter of the right occipital lobe is new from the prior MRI. T2 hyperintensities elsewhere in the cerebral white matter bilaterally are similar to the prior MRI and nonspecific but compatible with chronic small vessel ischemic disease, moderately advanced for age. Chronic lacunar infarcts are noted in the thalami and left basal ganglia. The ventricles are normal in size. Vascular: Poor visualization of the distal right vertebral artery, more fully evaluated below. Skull and upper cervical spine: Unremarkable bone marrow signal. Sinuses/Orbits: Unremarkable orbits. Clear paranasal sinuses. Trace right mastoid effusion. Other: None. MRA HEAD FINDINGS The study is mildly motion degraded. The visualized distal left vertebral artery is patent without evidence of  significant stenosis. No flow related enhancement is identified in the distal right vertebral artery which  was patent but small on the prior CTA. Grossly patent right PICA, bilateral AICA is, and bilateral SCA is are identified. The basilar artery is patent without significant stenosis. There is a moderately large left posterior communicating artery. There is the suggestion of a severe distal left P1 stenosis. The right PCA is patent without significant proximal stenosis. The internal carotid arteries are patent from skull base to carotid termini without evidence of significant stenosis allowing for artifact. ACAs and MCAs are patent without evidence of proximal branch occlusion or significant proximal stenosis. No aneurysm is identified. IMPRESSION: 1. No acute intracranial abnormality. 2. Age advanced chronic small vessel ischemic disease with multiple chronic lacunar infarcts and chronic microhemorrhages as above. 3. Motion degraded head MRA without evidence of significant proximal stenosis in the anterior circulation. 4. Nonvisualization of the distal right vertebral artery, previously shown to be hypoplastic and possibly now occluded. 5. Severe distal left P1 stenosis. Electronically Signed   By: Sebastian Ache M.D.   On: 08/06/2018 15:01   Mr Brain Wo Contrast  Result Date: 08/06/2018 CLINICAL DATA:  Word-finding difficulty and right-sided weakness. EXAM: MRI HEAD WITHOUT CONTRAST MRA HEAD WITHOUT CONTRAST TECHNIQUE: Multiplanar, multiecho pulse sequences of the brain and surrounding structures were obtained without intravenous contrast. Angiographic images of the head were obtained using MRA technique without contrast. COMPARISON:  Head CT 08/06/2018. Head MRI 09/15/2017 and 09/14/2017. Head and neck CTA 09/08/2017. FINDINGS: MRI HEAD FINDINGS Brain: No acute infarct, mass, midline shift, or extra-axial fluid collection is identified. Chronic microhemorrhages are again seen in the left putamen, thalami,  right parietal lobe, along the lateral margin of the atrium of the left lateral ventricle, and in the lower dorsal medulla. A chronic microhemorrhage in the anterolateral right occipital was not clearly present on the prior MRI. A chronic lacunar infarct in the periventricular white matter of the right occipital lobe is new from the prior MRI. T2 hyperintensities elsewhere in the cerebral white matter bilaterally are similar to the prior MRI and nonspecific but compatible with chronic small vessel ischemic disease, moderately advanced for age. Chronic lacunar infarcts are noted in the thalami and left basal ganglia. The ventricles are normal in size. Vascular: Poor visualization of the distal right vertebral artery, more fully evaluated below. Skull and upper cervical spine: Unremarkable bone marrow signal. Sinuses/Orbits: Unremarkable orbits. Clear paranasal sinuses. Trace right mastoid effusion. Other: None. MRA HEAD FINDINGS The study is mildly motion degraded. The visualized distal left vertebral artery is patent without evidence of significant stenosis. No flow related enhancement is identified in the distal right vertebral artery which was patent but small on the prior CTA. Grossly patent right PICA, bilateral AICA is, and bilateral SCA is are identified. The basilar artery is patent without significant stenosis. There is a moderately large left posterior communicating artery. There is the suggestion of a severe distal left P1 stenosis. The right PCA is patent without significant proximal stenosis. The internal carotid arteries are patent from skull base to carotid termini without evidence of significant stenosis allowing for artifact. ACAs and MCAs are patent without evidence of proximal branch occlusion or significant proximal stenosis. No aneurysm is identified. IMPRESSION: 1. No acute intracranial abnormality. 2. Age advanced chronic small vessel ischemic disease with multiple chronic lacunar infarcts and  chronic microhemorrhages as above. 3. Motion degraded head MRA without evidence of significant proximal stenosis in the anterior circulation. 4. Nonvisualization of the distal right vertebral artery, previously shown to be hypoplastic and possibly now occluded. 5. Severe  distal left P1 stenosis. Electronically Signed   By: Sebastian Ache M.D.   On: 08/06/2018 15:01   Ct Head Code Stroke Wo Contrast  Result Date: 08/06/2018 CLINICAL DATA:  Code stroke. Speech disturbance. Last seen normal 1200 hours. EXAM: CT HEAD WITHOUT CONTRAST TECHNIQUE: Contiguous axial images were obtained from the base of the skull through the vertex without intravenous contrast. COMPARISON:  09/15/2017 FINDINGS: Brain: No brainstem or cerebellum insult is seen. There are old lacunar infarctions of the thalami, left more extensive than right. There are mild chronic small-vessel ischemic changes of the white matter. No sign of acute infarction, mass lesion, hemorrhage, hydrocephalus or extra-axial collection. Vascular: There is atherosclerotic calcification of the major vessels at the base of the brain. Skull: Negative Sinuses/Orbits: Clear/normal Other: None ASPECTS (Alberta Stroke Program Early CT Score) - Ganglionic level infarction (caudate, lentiform nuclei, internal capsule, insula, M1-M3 cortex): 7 - Supraganglionic infarction (M4-M6 cortex): 3 Total score (0-10 with 10 being normal): 10 IMPRESSION: 1. No acute finding by CT. Chronic small-vessel ischemic change of the white matter. Old thalamic infarctions left more than right. 2. ASPECTS is 10. 3. These results were communicated to Dr. Otelia Limes at 1:54 pmon 5/7/2020by text page via the [facility] messaging system. Electronically Signed   By: Paulina Fusi M.D.   On: 08/06/2018 13:55    Procedures Procedures (including critical care time)  Medications Ordered in ED Medications  sodium chloride flush (NS) 0.9 % injection 3 mL (has no administration in time range)  sodium chloride 0.9  % bolus 1,000 mL (has no administration in time range)     Initial Impression / Assessment and Plan / ED Course  I have reviewed the triage vital signs and the nursing notes.  Pertinent labs & imaging results that were available during my care of the patient were reviewed by me and considered in my medical decision making (see chart for details).  Patient with extensive past medical history presenting with complaints of near syncope.  He became dizzy while at work.  These events were described in the HPI.  Patient's neurologic exam is currently nonfocal.  Patient did undergo code stroke work-up including CT scan and MRI.  Both of these were unremarkable for stroke.  His laboratory studies are suggestive of possibly mild dehydration.  Patient was given normal saline and has been ambulated in the emergency department.  He is now feeling much better and I believe is appropriate for discharge.  Patient's EKG showing nonspecific T wave abnormalities, but negative troponin.  He has had no chest pain or difficulty breathing and I doubt a cardiac etiology.  Final Clinical Impressions(s) / ED Diagnoses   Final diagnoses:  None    ED Discharge Orders    None       Geoffery Lyons, MD 08/06/18 Serena Croissant

## 2018-08-06 NOTE — Consult Note (Addendum)
Neurology Consultation  Reason for Consult: Code stroke Referring Physician: Dr. Dalene Seltzer  CC: Difficulty finding words and right sided weakness  History is obtained from: patient  HPI: Jeremiah Vance is a 48 y.o. male who was working when he went to the bathroom and after noted his right arm felt week and had word finding difficulties along with stuttering speech. Code stroke was called by EMS. Vitals were normal while en route. On arrival patient showed many exam inconsistencies, thus he was sent to MRI after CT.  He was offered tPA but declined. MRI obtained and showed no DWI abnormalities.   Chart review --code strokes called with same symptoms both in 09-06-17 and 09/14/2017: on 6/8 he did have a punctate infarct involving the posterior limb of the left internal capsule. On 6/16 he had another acute infarct in the left thalamus, posterior basal ganglia, and lateral geniculate.   LKW: 1200 tpa given?: No, patient declined until MRI obtained. MRI was negative for DWI abnromality and there was no acute LVO on MRA. Given the MRI results, risks of tPA are felt to significantly outweigh benefits Premorbid modified Rankin scale (mRS): 0 NIHSS-0   ROS: A 14 point ROS was performed and is negative except as noted in the HPI.  Past Medical History:  Diagnosis Date  . CHF (congestive heart failure) (HCC)    "coinsided w/kidney problems I was having 06/2017"  . CKD (chronic kidney disease) stage 3, GFR 30-59 ml/min (HCC) 06/2017  . High cholesterol   . History of cardiomyopathy    LVEF 40 to 45% in April 2019 - subsequently normalized  . Hypertension   . Ischemic stroke (HCC)    Small left internal capsule infarct due to lacunar disease  . Morbid obesity (HCC)   . Stroke (HCC) 08/2017   "right sided weakness since; getting stronger though" (10/22/2017)  . Type 2 diabetes mellitus (HCC)     Family History  Problem Relation Age of Onset  . Diabetes Father   . Heart disease Father   .  Diabetes Sister   . Diabetes Mother   . Stroke Mother   . Diabetes Maternal Grandmother   . Heart disease Maternal Grandmother   . Stroke Paternal Grandfather   . Heart disease Paternal Grandfather    Social History:   reports that he has never smoked. He has never used smokeless tobacco. He reports previous alcohol use. He reports that he does not use drugs.  Medications  Current Facility-Administered Medications:  .  sodium chloride flush (NS) 0.9 % injection 3 mL, 3 mL, Intravenous, Once, Alvira Monday, MD  Current Outpatient Medications:  .  acetaminophen (TYLENOL) 500 MG tablet, Take 500 mg by mouth every 6 (six) hours as needed for mild pain or moderate pain., Disp: , Rfl:  .  aspirin 81 MG chewable tablet, Chew 1 tablet (81 mg total) by mouth daily., Disp: 30 tablet, Rfl: 0 .  atorvastatin (LIPITOR) 40 MG tablet, Take 1 tablet (40 mg total) by mouth daily at 6 PM., Disp: 90 tablet, Rfl: 3 .  carvedilol (COREG) 6.25 MG tablet, Take 1 tablet (6.25 mg total) by mouth 2 (two) times daily., Disp: 180 tablet, Rfl: 3 .  lisinopril (ZESTRIL) 20 MG tablet, TAKE ONE-HALF TABLET BY MOUTH DAILY, Disp: 30 tablet, Rfl: 2 .  metFORMIN (GLUCOPHAGE) 1000 MG tablet, Take 1 tablet (1,000 mg total) by mouth 2 (two) times daily with a meal., Disp: 180 tablet, Rfl: 3 .  spironolactone (ALDACTONE) 25 MG  tablet, TAKE ONE TABLET BY MOUTH DAILY, Disp: 30 tablet, Rfl: 2 .  torsemide (DEMADEX) 20 MG tablet, Take 2 tablets (40 mg total) by mouth daily., Disp: 60 tablet, Rfl: 4   Exam: Current vital signs: Ht 5\' 11"  (1.803 m)   Wt (!) 151 kg   BMI 46.43 kg/m  Vital signs in last 24 hours: Weight:  [151 kg] 151 kg (05/07 1300)  Physical Exam  Constitutional:  well-nourished.  Psych: Affect appropriate to situation Eyes: No scleral injection HENT: No OP obstrucion Head: Normocephalic.  Cardiovascular: Normal rate and regular rhythm.  Respiratory: Effort normal, non-labored breathing GI: Soft.   No distension. There is no tenderness.  Skin: WDI  Neuro: Mental Status: Patient is awake, alert, oriented to person, place, month, year, and situation. Patient is able to give a clear and coherent history. No signs of aphasia or neglect Cranial Nerves: II: Visual Fields are full.  III,IV, VI: EOMI without ptosis or diploplia. Pupils equal, round and reactive to light V: Facial sensation is symmetric to temperature VII: Facial movement is symmetric. Again inconsistencies. initially he pulled his right face down but later was symmetrical VIII: hearing is intact to voice X: Palat elevates symmetrically XI: Shoulder shrug is symmetric. XII: tongue is midline without atrophy or fasciculations. Initially he deviated his tongue sharply to the left after sticking it out straight, but when asked he was able to move side to side without difficulty Motor: Moving all extremities antigravity with inconsistent effort that responds to coaching Sensory:  Sensation is symmetric to light touch and temperature in the arms and legs.proximally Deep Tendon Reflexes: 1+ and symmetric in the biceps and patellae.  Plantars: Toes are downgoing bilaterally.   Cerebellar: FNF and HKS are intact bilaterally  Labs I have reviewed labs in epic and the results pertinent to this consultation are:   CBC    Component Value Date/Time   WBC 9.8 08/06/2018 1348   RBC 4.15 (L) 08/06/2018 1348   HGB 12.2 (L) 08/06/2018 1348   HGB 12.0 (L) 06/17/2018 1455   HCT 38.2 (L) 08/06/2018 1348   HCT 34.9 (L) 06/17/2018 1455   PLT 325 08/06/2018 1348   PLT 296 06/17/2018 1455   MCV 92.0 08/06/2018 1348   MCV 85 06/17/2018 1455   MCH 29.4 08/06/2018 1348   MCHC 31.9 08/06/2018 1348   RDW 13.2 08/06/2018 1348   RDW 12.4 06/17/2018 1455   LYMPHSABS 2.5 08/06/2018 1348   LYMPHSABS 2.7 12/16/2017 1415   MONOABS 0.8 08/06/2018 1348   EOSABS 0.4 08/06/2018 1348   EOSABS 0.4 12/16/2017 1415   BASOSABS 0.1 08/06/2018  1348   BASOSABS 0.1 12/16/2017 1415    CMP     Component Value Date/Time   NA 139 06/23/2018 1105   K 5.3 (H) 06/23/2018 1105   CL 101 06/23/2018 1105   CO2 24 06/23/2018 1105   GLUCOSE 165 (H) 06/23/2018 1105   GLUCOSE 94 10/28/2017 0630   BUN 21 06/23/2018 1105   CREATININE 2.00 (H) 08/06/2018 1351   CALCIUM 9.6 06/23/2018 1105   PROT 7.2 06/17/2018 1455   ALBUMIN 4.2 06/17/2018 1455   AST 18 06/17/2018 1455   ALT 18 06/17/2018 1455   ALKPHOS 127 (H) 06/17/2018 1455   BILITOT 0.6 06/17/2018 1455   GFRNONAA 63 06/23/2018 1105   GFRAA 73 06/23/2018 1105    Lipid Panel     Component Value Date/Time   CHOL 115 06/17/2018 1455   TRIG 103 06/17/2018 1455  HDL 41 06/17/2018 1455   CHOLHDL 2.8 06/17/2018 1455   CHOLHDL 4.2 09/07/2017 0530   VLDL 16 09/07/2017 0530   LDLCALC 53 06/17/2018 1455     Imaging I have reviewed the images obtained:  CT-scan of the brain--no acute findings  MRI examination of the brain--final reading pending  Felicie MornDavid Smith PA-C [facility] [phone] 08/06/2018, 1:56 PM    Assessment: 48 year old male with right sided weakness and stuttering speech. DWI showed no abnormalities., thus likely recrudescence of previous symptoms.   Recommendations: - Plavix 75 mg daily - Lipitor 80 mg - Outpatient PT/OT/ST - Outpatient follow up with neurology.  I have inteviewed and examined the patient. I have formulated the assessment and plan. My examination findings were observed and documented by Felicie Mornavid Smith, PA Electronically signed: Dr. Caryl PinaEric Quanetta Truss

## 2018-08-06 NOTE — ED Notes (Signed)
Updated patient's family plan for IV fluids, ambulate and discharge home as long as he is feeling better. Pts family waiting in the car in parking lot to take patient home once discharged. Please call when ready.

## 2018-08-06 NOTE — ED Notes (Signed)
Pt CBG was 145, notified Madison(RN)

## 2018-08-06 NOTE — ED Notes (Signed)
Patient verbalizes understanding of discharge instructions. Opportunity for questioning and answers were provided. Armband removed by staff, pt discharged from ED ambulatory.   

## 2018-08-06 NOTE — ED Notes (Signed)
Sister wants to be called with updates [phone]

## 2018-08-19 DIAGNOSIS — Z0271 Encounter for disability determination: Secondary | ICD-10-CM

## 2018-09-26 ENCOUNTER — Other Ambulatory Visit: Payer: Self-pay | Admitting: Family Medicine

## 2018-09-26 DIAGNOSIS — I5032 Chronic diastolic (congestive) heart failure: Secondary | ICD-10-CM

## 2019-06-19 ENCOUNTER — Other Ambulatory Visit: Payer: Self-pay

## 2019-06-19 ENCOUNTER — Ambulatory Visit: Payer: Self-pay | Attending: Internal Medicine

## 2019-06-19 DIAGNOSIS — Z23 Encounter for immunization: Secondary | ICD-10-CM

## 2019-06-19 NOTE — Progress Notes (Signed)
   Covid-19 Vaccination Clinic  Name:  Jeremiah Vance    MRN: [id number] DOB: 12-29-1970  06/19/2019  Mr. Line was observed post Covid-19 immunization for 15 minutes without incident. He was provided with Vaccine Information Sheet and instruction to access the V-Safe system.   [name] was instructed to call 911 with any severe reactions post vaccine: [name] Kitchen Difficulty breathing  . Swelling of face and throat  . A fast heartbeat  . A bad rash all over body  . Dizziness and weakness   Immunizations Administered    Name Date Dose VIS Date Route   Pfizer COVID-19 Vaccine 06/19/2019 10:20 AM 0.3 mL 03/12/2019 Intramuscular   Manufacturer: [employer], [employer]   Lot: [id number]   NDC: [address]-5

## 2019-07-08 ENCOUNTER — Other Ambulatory Visit: Payer: Self-pay

## 2019-07-08 ENCOUNTER — Observation Stay
Admission: EM | Admit: 2019-07-08 | Discharge: 2019-07-09 | Disposition: A | Discharge status: Home / Self Care | Payer: Self-pay | Attending: Family Medicine | Admitting: Family Medicine

## 2019-07-08 ENCOUNTER — Encounter: Payer: Self-pay | Admitting: Emergency Medicine

## 2019-07-08 ENCOUNTER — Emergency Department: Payer: Self-pay

## 2019-07-08 ENCOUNTER — Observation Stay: Payer: Self-pay

## 2019-07-08 DIAGNOSIS — Z20822 Contact with and (suspected) exposure to covid-19: Secondary | ICD-10-CM | POA: Insufficient documentation

## 2019-07-08 DIAGNOSIS — Z7984 Long term (current) use of oral hypoglycemic drugs: Secondary | ICD-10-CM | POA: Insufficient documentation

## 2019-07-08 DIAGNOSIS — I160 Hypertensive urgency: Secondary | ICD-10-CM | POA: Insufficient documentation

## 2019-07-08 DIAGNOSIS — N1831 Chronic kidney disease, stage 3a: Secondary | ICD-10-CM | POA: Insufficient documentation

## 2019-07-08 DIAGNOSIS — Z79899 Other long term (current) drug therapy: Secondary | ICD-10-CM | POA: Insufficient documentation

## 2019-07-08 DIAGNOSIS — I639 Cerebral infarction, unspecified: Secondary | ICD-10-CM

## 2019-07-08 DIAGNOSIS — E1169 Type 2 diabetes mellitus with other specified complication: Secondary | ICD-10-CM | POA: Diagnosis present

## 2019-07-08 DIAGNOSIS — N189 Chronic kidney disease, unspecified: Secondary | ICD-10-CM | POA: Diagnosis present

## 2019-07-08 DIAGNOSIS — E1165 Type 2 diabetes mellitus with hyperglycemia: Secondary | ICD-10-CM

## 2019-07-08 DIAGNOSIS — R7989 Other specified abnormal findings of blood chemistry: Secondary | ICD-10-CM

## 2019-07-08 DIAGNOSIS — N179 Acute kidney failure, unspecified: Secondary | ICD-10-CM | POA: Insufficient documentation

## 2019-07-08 DIAGNOSIS — I5042 Chronic combined systolic (congestive) and diastolic (congestive) heart failure: Secondary | ICD-10-CM | POA: Insufficient documentation

## 2019-07-08 DIAGNOSIS — E1122 Type 2 diabetes mellitus with diabetic chronic kidney disease: Secondary | ICD-10-CM | POA: Insufficient documentation

## 2019-07-08 DIAGNOSIS — R079 Chest pain, unspecified: Secondary | ICD-10-CM | POA: Diagnosis present

## 2019-07-08 DIAGNOSIS — Z7982 Long term (current) use of aspirin: Secondary | ICD-10-CM | POA: Insufficient documentation

## 2019-07-08 DIAGNOSIS — N183 Chronic kidney disease, stage 3 unspecified: Secondary | ICD-10-CM | POA: Insufficient documentation

## 2019-07-08 DIAGNOSIS — I10 Essential (primary) hypertension: Secondary | ICD-10-CM

## 2019-07-08 DIAGNOSIS — R778 Other specified abnormalities of plasma proteins: Secondary | ICD-10-CM

## 2019-07-08 DIAGNOSIS — I429 Cardiomyopathy, unspecified: Secondary | ICD-10-CM | POA: Insufficient documentation

## 2019-07-08 DIAGNOSIS — E7800 Pure hypercholesterolemia, unspecified: Secondary | ICD-10-CM | POA: Insufficient documentation

## 2019-07-08 DIAGNOSIS — R0789 Other chest pain: Principal | ICD-10-CM | POA: Insufficient documentation

## 2019-07-08 DIAGNOSIS — Z6841 Body Mass Index (BMI) 40.0 and over, adult: Secondary | ICD-10-CM | POA: Insufficient documentation

## 2019-07-08 DIAGNOSIS — I5032 Chronic diastolic (congestive) heart failure: Secondary | ICD-10-CM

## 2019-07-08 DIAGNOSIS — I69351 Hemiplegia and hemiparesis following cerebral infarction affecting right dominant side: Secondary | ICD-10-CM | POA: Insufficient documentation

## 2019-07-08 DIAGNOSIS — I272 Pulmonary hypertension, unspecified: Secondary | ICD-10-CM | POA: Insufficient documentation

## 2019-07-08 DIAGNOSIS — G4733 Obstructive sleep apnea (adult) (pediatric): Secondary | ICD-10-CM | POA: Insufficient documentation

## 2019-07-08 DIAGNOSIS — E118 Type 2 diabetes mellitus with unspecified complications: Secondary | ICD-10-CM | POA: Diagnosis present

## 2019-07-08 DIAGNOSIS — I130 Hypertensive heart and chronic kidney disease with heart failure and stage 1 through stage 4 chronic kidney disease, or unspecified chronic kidney disease: Secondary | ICD-10-CM | POA: Insufficient documentation

## 2019-07-08 DIAGNOSIS — E785 Hyperlipidemia, unspecified: Secondary | ICD-10-CM | POA: Diagnosis present

## 2019-07-08 DIAGNOSIS — E1129 Type 2 diabetes mellitus with other diabetic kidney complication: Secondary | ICD-10-CM | POA: Diagnosis present

## 2019-07-08 HISTORY — DX: Other specified abnormalities of plasma proteins: R77.8

## 2019-07-08 HISTORY — DX: Other specified abnormal findings of blood chemistry: R79.89

## 2019-07-08 HISTORY — DX: Acute kidney failure, unspecified: N17.9

## 2019-07-08 HISTORY — DX: Chronic kidney disease, stage 3a: N18.31

## 2019-07-08 LAB — BASIC METABOLIC PANEL
Anion gap: 7 (ref 5–15)
BUN: 26 mg/dL — ABNORMAL HIGH (ref 6–20)
CO2: 28 mmol/L (ref 22–32)
Calcium: 8.4 mg/dL — ABNORMAL LOW (ref 8.9–10.3)
Chloride: 101 mmol/L (ref 98–111)
Creatinine, Ser: 1.5 mg/dL — ABNORMAL HIGH (ref 0.61–1.24)
GFR calc Af Amer: >60 mL/min (ref >60)
GFR calc non Af Amer: 54 mL/min — ABNORMAL LOW (ref >60)
Glucose, Bld: 307 mg/dL — ABNORMAL HIGH (ref 70–99)
Potassium: 4.6 mmol/L (ref 3.5–5.1)
Sodium: 136 mmol/L (ref 135–145)

## 2019-07-08 LAB — URINE DRUG SCREEN, QUALITATIVE (ARMC ONLY)
Amphetamines, Ur Screen: NOT DETECTED
Barbiturates, Ur Screen: NOT DETECTED
Benzodiazepine, Ur Scrn: NOT DETECTED
Cannabinoid 50 Ng, Ur ~~LOC~~: NOT DETECTED
Cocaine Metabolite,Ur ~~LOC~~: NOT DETECTED
MDMA (Ecstasy)Ur Screen: NOT DETECTED
Methadone Scn, Ur: NOT DETECTED
Opiate, Ur Screen: POSITIVE — AB
Phencyclidine (PCP) Ur S: NOT DETECTED
Tricyclic, Ur Screen: NOT DETECTED

## 2019-07-08 LAB — CBC
HCT: 42.7 % (ref 39.0–52.0)
Hemoglobin: 13.5 g/dL (ref 13.0–17.0)
MCH: 27.7 pg (ref 26.0–34.0)
MCHC: 31.6 g/dL (ref 30.0–36.0)
MCV: 87.5 fL (ref 80.0–100.0)
Platelets: 268 10*3/uL (ref 150–400)
RBC: 4.88 MIL/uL (ref 4.22–5.81)
RDW: 13.2 % (ref 11.5–15.5)
WBC: 10.9 10*3/uL — ABNORMAL HIGH (ref 4.0–10.5)
nRBC: 0 % (ref 0.0–0.2)

## 2019-07-08 LAB — TROPONIN I (HIGH SENSITIVITY)
Troponin I (High Sensitivity): 35 ng/L — ABNORMAL HIGH (ref <18)
Troponin I (High Sensitivity): 31 ng/L — ABNORMAL HIGH (ref <18)
Troponin I (High Sensitivity): 32 ng/L — ABNORMAL HIGH (ref <18)
Troponin I (High Sensitivity): 35 ng/L — ABNORMAL HIGH (ref <18)
Troponin I (High Sensitivity): 34 ng/L — ABNORMAL HIGH (ref <18)

## 2019-07-08 LAB — HIV ANTIBODY (ROUTINE TESTING W REFLEX): HIV Screen 4th Generation wRfx: NONREACTIVE

## 2019-07-08 LAB — BRAIN NATRIURETIC PEPTIDE: B Natriuretic Peptide: 219 pg/mL — ABNORMAL HIGH (ref 0.0–100.0)

## 2019-07-08 LAB — GLUCOSE, CAPILLARY
Glucose-Capillary: 147 mg/dL — ABNORMAL HIGH (ref 70–99)
Glucose-Capillary: 150 mg/dL — ABNORMAL HIGH (ref 70–99)

## 2019-07-08 LAB — SARS CORONAVIRUS 2 (TAT 6-24 HRS): SARS Coronavirus 2: NEGATIVE

## 2019-07-08 MED ORDER — IOHEXOL 350 MG/ML SOLN
100.0000 mL | Freq: Once | INTRAVENOUS | Status: AC | PRN
  Administered 2019-07-08: 100 mL via INTRAVENOUS

## 2019-07-08 MED ORDER — INSULIN ASPART 100 UNIT/ML ~~LOC~~ SOLN: 0.0000 [IU] | Freq: Every day | SUBCUTANEOUS | Status: DC

## 2019-07-08 MED ORDER — ATORVASTATIN CALCIUM 20 MG PO TABS: 40.0000 mg | ORAL_TABLET | Freq: Every day | ORAL | Status: DC

## 2019-07-08 MED ORDER — ASPIRIN 81 MG PO CHEW
324.0000 mg | CHEWABLE_TABLET | Freq: Once | ORAL | Status: AC
  Administered 2019-07-08: 324 mg via ORAL
  Filled 2019-07-08: qty 4

## 2019-07-08 MED ORDER — ASPIRIN 81 MG PO CHEW
81.0000 mg | CHEWABLE_TABLET | Freq: Every day | ORAL | Status: DC
  Administered 2019-07-09: 81 mg via ORAL
  Filled 2019-07-08: qty 1

## 2019-07-08 MED ORDER — NITROGLYCERIN 0.4 MG SL SUBL
0.4000 mg | SUBLINGUAL_TABLET | SUBLINGUAL | Status: DC | PRN
  Filled 2019-07-08 (×2): qty 1

## 2019-07-08 MED ORDER — ACETAMINOPHEN 325 MG PO TABS: 650.0000 mg | ORAL_TABLET | Freq: Four times a day (QID) | ORAL | Status: DC | PRN

## 2019-07-08 MED ORDER — HYDRALAZINE HCL 20 MG/ML IJ SOLN
5.0000 mg | INTRAMUSCULAR | Status: DC | PRN
  Administered 2019-07-08 (×2): 5 mg via INTRAVENOUS
  Filled 2019-07-08: qty 1

## 2019-07-08 MED ORDER — LISINOPRIL 10 MG PO TABS
10.0000 mg | ORAL_TABLET | Freq: Every day | ORAL | Status: DC
  Administered 2019-07-08 – 2019-07-09 (×2): 10 mg via ORAL
  Filled 2019-07-08 (×2): qty 1

## 2019-07-08 MED ORDER — FUROSEMIDE 10 MG/ML IJ SOLN
40.0000 mg | Freq: Two times a day (BID) | INTRAMUSCULAR | Status: DC
  Administered 2019-07-08 – 2019-07-09 (×2): 40 mg via INTRAVENOUS
  Filled 2019-07-08 (×2): qty 4

## 2019-07-08 MED ORDER — MORPHINE SULFATE (PF) 4 MG/ML IV SOLN
6.0000 mg | Freq: Once | INTRAVENOUS | Status: AC
  Administered 2019-07-08: 13:00:00 6 mg via INTRAVENOUS
  Filled 2019-07-08: qty 2

## 2019-07-08 MED ORDER — SPIRONOLACTONE 25 MG PO TABS
25.0000 mg | ORAL_TABLET | Freq: Every day | ORAL | Status: DC
  Administered 2019-07-08 – 2019-07-09 (×2): 25 mg via ORAL
  Filled 2019-07-08 (×2): qty 1

## 2019-07-08 MED ORDER — MORPHINE SULFATE (PF) 2 MG/ML IV SOLN: 2.0000 mg | INTRAVENOUS | Status: DC | PRN

## 2019-07-08 MED ORDER — ONDANSETRON HCL 4 MG/2ML IJ SOLN
4.0000 mg | Freq: Once | INTRAMUSCULAR | Status: AC
  Administered 2019-07-08: 4 mg via INTRAVENOUS
  Filled 2019-07-08: qty 2

## 2019-07-08 MED ORDER — INSULIN ASPART 100 UNIT/ML ~~LOC~~ SOLN
0.0000 [IU] | Freq: Three times a day (TID) | SUBCUTANEOUS | Status: DC
  Administered 2019-07-08: 2 [IU] via SUBCUTANEOUS
  Administered 2019-07-09: 3 [IU] via SUBCUTANEOUS
  Filled 2019-07-08 (×2): qty 1

## 2019-07-08 MED ORDER — ONDANSETRON HCL 4 MG/2ML IJ SOLN: 4.0000 mg | Freq: Three times a day (TID) | INTRAMUSCULAR | Status: DC | PRN

## 2019-07-08 MED ORDER — CARVEDILOL 6.25 MG PO TABS
6.2500 mg | ORAL_TABLET | Freq: Two times a day (BID) | ORAL | Status: DC
  Administered 2019-07-08 – 2019-07-09 (×2): 6.25 mg via ORAL
  Filled 2019-07-08 (×2): qty 1

## 2019-07-08 MED ORDER — ENOXAPARIN SODIUM 40 MG/0.4ML ~~LOC~~ SOLN
40.0000 mg | Freq: Two times a day (BID) | SUBCUTANEOUS | Status: DC
  Administered 2019-07-08 – 2019-07-09 (×2): 40 mg via SUBCUTANEOUS
  Filled 2019-07-08 (×2): qty 0.4

## 2019-07-08 NOTE — ED Notes (Signed)
Report given to Cheryl, RN.

## 2019-07-08 NOTE — ED Notes (Signed)
Pt in NAD and denies needs at this time

## 2019-07-08 NOTE — ED Notes (Signed)
Pt transported to CT ?

## 2019-07-08 NOTE — Consult Note (Signed)
Cardiology Consultation:   Patient ID: Jeremiah Vance; [id number]; [birth year]   Admit date: 07/08/2019 Date of Consult: 07/08/2019  Primary Care Provider: Danna Hefty, DO Primary Cardiologist: Domenic Polite   Patient Profile:   Jeremiah Vance is a 49 y.o. male with a hx of HFrEF with an EF of 45% in 06/2017 subsequently improved to 65% by echo in 08/2017, CVA, CKD stage III, DM2, HTN, HLD, OSA not on CPAP with possible OHS, and obesity who is being seen today for the evaluation of chest pain with elevated troponin at the request of Dr. Blaine Hamper.  History of Present Illness:   Mr. Roots previously underwent echo in 06/2017 which showed an EF of 40 to 45%.  He was markedly volume overloaded at that time and required IV diuresis.  He was subsequently admitted to the hospital in 08/2016 with a CVA with follow-up echo showing no intracardiac source of thrombus with an improved LV systolic function with an EF of 60 to 65%.  No cardiac arrhythmias were documented.  He followed up with cardiology in 08/2017 and was overall feeling better.  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  He has been lost to follow-up since.  On the morning of 4/8, after eating a biscuit and hash brown from [employer], while at work, standing, he developed headache with blurry vision following by chest pain with associated diaphoresis and paleness. No dyspnea, nausea, vomiting, palpitations, dizziness, presyncope, or syncope. No worsening in his chronic lower extremity swelling.   BP has been in the 086P systolic since arrival in the ED.  EKG showed NSR with sinus arrhythmia, 68 bpm, LAFB, poor R wave progression along the precordial leads, lateral T wave inversion.  CXR showed mild cardiomegaly with pulmonary vascular congestion.  CT head showed no acute intracranial abnormalities with minimal small vessel chronic ischemic changes and probable tiny old lacunar infarcts.  Labs showed  high-sensitivity troponin 35 with a delta of 31, BNP 219, WBC 10.9, Hgb 13.5, PLT 268, potassium 4.6, BUN 26, serum creatinine 1.5.  In the ED, he received Zofran and ASA 324 mg x 1.  CTA chest showed no evidence without any noted significant coronary artery calcifications.  Currently, symptom free.  Past Medical History:  Diagnosis Date  . CHF (congestive heart failure) ([place])    "coinsided w/kidney problems I was having 06/2017"  . CKD (chronic kidney disease) stage 3, GFR 30-59 ml/min 06/2017  . High cholesterol   . History of cardiomyopathy    LVEF 40 to 45% in April 2019 - subsequently normalized  . Hypertension   . Ischemic stroke ([place])    Small left internal capsule infarct due to lacunar disease  . Morbid obesity ([place])   . Stroke ([place]) 08/2017   "right sided weakness since; getting stronger though" (10/22/2017)  . Type 2 diabetes mellitus ([place])     Past Surgical History:  Procedure Laterality Date  . ABDOMINAL HERNIA REPAIR  2008; 10/22/2017   "scope; OPEN REPAIR INCARCERATED VENTRAL HERNIA  . HERNIA REPAIR    . KNEE ARTHROSCOPY Right 1989  . VENTRAL HERNIA REPAIR N/A 10/22/2017   Procedure: OPEN REPAIR INCARCERATED VENTRAL HERNIA;  Surgeon: Excell Seltzer, MD;  Location: [facility];  Service: General;  Laterality: N/A;     Home Meds: Prior to Admission medications   Medication Sig Start Date End Date Taking? Authorizing Provider  acetaminophen (TYLENOL) 500 MG tablet Take 500 mg by mouth every 6 (six) hours  as needed for mild pain or moderate pain.    [provider]  aspirin 81 MG chewable tablet Chew 1 tablet (81 mg total) by mouth daily. 09/09/17   Myrene Buddy, MD  atorvastatin (LIPITOR) 40 MG tablet Take 1 tablet (40 mg total) by mouth daily at 6 PM. 11/18/17   Wendee Beavers, DO  carvedilol (COREG) 6.25 MG tablet Take 1 tablet (6.25 mg total) by mouth 2 (two) times daily. 11/18/17 11/18/18  Wendee Beavers, DO  lisinopril (ZESTRIL) 20 MG tablet TAKE ONE-HALF  TABLET BY MOUTH DAILY Patient taking differently: Take 10 mg by mouth daily.  07/20/18   Wendee Beavers, DO  metFORMIN (GLUCOPHAGE) 1000 MG tablet Take 1 tablet (1,000 mg total) by mouth 2 (two) times daily with a meal. Patient taking differently: Take 500 mg by mouth 2 (two) times daily with a meal.  06/03/18   Wendee Beavers, DO  spironolactone (ALDACTONE) 25 MG tablet TAKE ONE TABLET BY MOUTH DAILY Patient taking differently: Take 25 mg by mouth daily.  07/20/18   Wendee Beavers, DO  torsemide (DEMADEX) 20 MG tablet TAKE TWO TABLETS BY MOUTH DAILY 09/28/18   Wendee Beavers, DO    Inpatient Medications: Scheduled Meds: . enoxaparin (LOVENOX) injection  40 mg Subcutaneous BID  . insulin aspart  0-15 Units Subcutaneous TID WC  . insulin aspart  0-5 Units Subcutaneous QHS   Continuous Infusions:  PRN Meds: acetaminophen, hydrALAZINE, morphine injection, nitroGLYCERIN, ondansetron (ZOFRAN) IV  Allergies:  No Known Allergies  Social History:   Social History   Socioeconomic History  . Marital status: Legally Separated    Spouse name: Not on file  . Number of children: Not on file  . Years of education: Not on file  . Highest education level: Not on file  Occupational History  . Not on file  Tobacco Use  . Smoking status: Never Smoker  . Smokeless tobacco: Never Used  Substance and Sexual Activity  . Alcohol use: Not Currently    Comment: 10/22/2017 "sober since 2000"  . Drug use: Never  . Sexual activity: Not Currently  Other Topics Concern  . Not on file  Social History Narrative  . Not on file   Social Determinants of Health   Financial Resource Strain:   . Difficulty of Paying Living Expenses:   Food Insecurity:   . Worried About Programme researcher, broadcasting/film/video in the Last Year:   . Barista in the Last Year:   Transportation Needs:   . Freight forwarder (Medical):   [name] Kitchen Lack of Transportation (Non-Medical):   Physical Activity:   . Days of Exercise per  Week:   . Minutes of Exercise per Session:   Stress:   . Feeling of Stress :   Social Connections:   . Frequency of Communication with Friends and Family:   . Frequency of Social Gatherings with Friends and Family:   . Attends Religious Services:   . Active Member of Clubs or Organizations:   . Attends Banker Meetings:   [name] Kitchen Marital Status:   Intimate Partner Violence:   . Fear of Current or Ex-Partner:   . Emotionally Abused:   [name] Kitchen Physically Abused:   . Sexually Abused:      Family History:   Family History  Problem Relation Age of Onset  . Diabetes Father   . Heart disease Father   . Diabetes Sister   . Diabetes Mother   .  Stroke Mother   . Diabetes Maternal Grandmother   . Heart disease Maternal Grandmother   . Stroke Paternal Grandfather   . Heart disease Paternal Grandfather     ROS:  Review of Systems  Constitutional: Positive for malaise/fatigue. Negative for chills, diaphoresis, fever and weight loss.  HENT: Negative for congestion.   Eyes: Negative for discharge and redness.  Respiratory: Negative for cough, sputum production, shortness of breath and wheezing.   Cardiovascular: Positive for chest pain. Negative for palpitations, orthopnea, claudication, leg swelling and PND.  Gastrointestinal: Positive for heartburn. Negative for abdominal pain, nausea and vomiting.  Musculoskeletal: Negative for falls and myalgias.  Skin: Negative for Vance.  Neurological: Negative for dizziness, tingling, tremors, sensory change, speech change, focal weakness, loss of consciousness and weakness.  Endo/Heme/Allergies: Does not bruise/bleed easily.  Psychiatric/Behavioral: Negative for substance abuse. The patient is not nervous/anxious.   All other systems reviewed and are negative.     Physical Exam/Data:   Vitals:   07/08/19 0935 07/08/19 1303 07/08/19 1304 07/08/19 1345  BP:  (!) 194/90    Pulse:   (!) 59 67  Resp:  (!) 21 17 (!) 22  Temp:      TempSrc:       SpO2:   93% 95%  Weight: (!) 181.4 kg     Height: 5\' 10"  (1.778 m)      No intake or output data in the 24 hours ending 07/08/19 1446 Filed Weights   07/08/19 0935  Weight: (!) 181.4 kg   Body mass index is 57.39 kg/m.   Physical Exam: General: Well developed, well nourished, in no acute distress. Head: Normocephalic, atraumatic, sclera non-icteric, no xanthomas, nares without discharge.  Neck: Negative for carotid bruits. JVD not elevated. Lungs: Clear bilaterally to auscultation without wheezes, rales, or rhonchi. Breathing is unlabored. Heart: RRR with S1 S2. No murmurs, rubs, or gallops appreciated. Abdomen: Soft, non-tender, non-distended with normoactive bowel sounds. No hepatomegaly. No rebound/guarding. No obvious abdominal masses. Msk:  Strength and tone appear normal for age. Extremities: No clubbing or cyanosis. 1+ bilateral lower extremity edema. Distal pedal pulses are 2+ and equal bilaterally. Neuro: Alert and oriented X 3. No facial asymmetry. No focal deficit. Moves all extremities spontaneously. Psych:  Responds to questions appropriately with a normal affect.   EKG:  The EKG was personally reviewed and demonstrates: NSR with sinus arrhythmia, 68 bpm, LAFB, poor R wave progression along the precordial leads, lateral T wave inversion Telemetry:  Telemetry was personally reviewed and demonstrates: SR  Weights: 09/07/19   07/08/19 0935  Weight: (!) 181.4 kg    Relevant CV Studies: 2D echo 08/2017: - Left ventricle: The cavity size was moderately dilated. Wall  thickness was increased in a pattern of mild LVH. Systolic  function was normal. The estimated ejection fraction was in the  range of 60% to 65%.  - Mitral valve: Calcified annulus. Mildly thickened leaflets .  Valve area by pressure half-time: 1.4 cm^2.  __________  2D echo this admission pending  Laboratory Data:  Chemistry Recent Labs  Lab 07/08/19 0938  NA 136  K 4.6  CL  101  CO2 28  GLUCOSE 307*  BUN 26*  CREATININE 1.50*  CALCIUM 8.4*  GFRNONAA 54*  GFRAA >60  ANIONGAP 7    No results for input(s): PROT, ALBUMIN, AST, ALT, ALKPHOS, BILITOT in the last 168 hours. Hematology Recent Labs  Lab 07/08/19 0938  WBC 10.9*  RBC 4.88  HGB 13.5  HCT  42.7  MCV 87.5  MCH 27.7  MCHC 31.6  RDW 13.2  PLT 268   Cardiac EnzymesNo results for input(s): TROPONINI in the last 168 hours. No results for input(s): TROPIPOC in the last 168 hours.  BNP Recent Labs  Lab 07/08/19 0938  BNP 219.0*    DDimer No results for input(s): DDIMER in the last 168 hours.  Radiology/Studies:  DG Chest 2 View  Result Date: 07/08/2019 IMPRESSION: Mild cardiomegaly with central pulmonary vascular congestion. Electronically Signed   By: Lupita Raider M.D.   On: 07/08/2019 10:06   CT Head Wo Contrast  Result Date: 07/08/2019 IMPRESSION: Minimal small vessel chronic ischemic changes of deep cerebral white matter. Probable tiny old lacunar infarcts at the thalami bilaterally. No acute intracranial abnormalities. Electronically Signed   By: Ulyses Southward M.D.   On: 07/08/2019 12:39    Assessment and Plan:   1. Chest pain: -Of uncertain etiology, though appears to be less likely ischemic in etiology given minimal bump in HS-Tn not consistent with ACS, symptoms occurring at rest following a [place] breakfast and no significant coronary artery calcification noted on CTA chest  -EKG is largely similar to prior and cannot exclude lead placement  -He is not a good noninvasive ischemic testing candidate given his body habitus -Preliminary echo with preserved LVSF and no WMA -Follow up as outpatient per discussion with MD  2. Chronic combined systolic and diastolic CHF: -Volume assessment is difficult secondary to body habitus  -PTA torsemide and spironolactone  -CHF education   3. Pulmonary hypertension: -Dilated pulmonary artery noted on CTA chest -Likely in the setting  of OSA not on CPAP -Recommend he follow up with his PCP to discuss this further  -Torsemide   4. Obesity with likely OHS with dependent edema: -Weight loss is advised  -Compression stockings and leg elevation recommended   5. HLD: -LDL of 102 this admission with goal being < 70 in the setting of prior CVA -Lipitor   6. DM2: -A1c 9.4 this admission -Outpatient follow up with PCP  7. CKD stage II: -Stable -Optimal BP control advised  -Would be cautious with spironolactone use  8. HTN: -Presented with SBP in the 190s mmHg -Improved to the 120s systolic -Coreg, lisinopril, spironolactone, torsemide    For questions or updates, please contact [facility] Please consult [email or web address] for contact info under Cardiology/STEMI.   Signed, Eula Listen, PA-C [facility] Pager: [phone] 07/08/2019, 2:46 PM

## 2019-07-08 NOTE — Progress Notes (Deleted)
k-pad applied to feet.  Pt complaining of cramps and says at home heat relieves cramps.

## 2019-07-08 NOTE — H&P (Signed)
History and Physical    ZI [place]Y RWE:[id number] DOB: 10-03-1970 DOA: 07/08/2019  Referring MD/NP/PA:   PCP: Joana Reamer, DO   Patient coming from:  The patient is coming from home.  At baseline, pt is independent for most of ADL.        Chief Complaint: Chest pain  HPI: Jeremiah Vance is a 49 y.o. male with medical history significant of hypertension, hyperlipidemia, diabetes mellitus, stroke, CKD-3, CHF, morbid obesity, who presents with chest pain.  Patient states that his chest pain started today will he was working.  It is located in the mid and the left side of chest, pressure-like, 5 out of 10 severity, nonradiating.  Is associated with shortness of breath, dry cough.  No fever or chills.  Patient does not have nausea, vomiting, diarrhea, abdominal pain, symptoms of UTI.  Patient states that he had one episode of headache and blurry vision, which have resolved.  ED Course: pt was found to have troponin 35 --> 31, BNP 219, WBC 10.9, slightly worsening renal function, pending COVID-19 PCR, blood pressure 196/85, heart rate 65, oxygen saturation 94% initially, later on desaturated to 88% at one time, but then come back to 100% on room air.  Temperature normal, chest x-ray showed cardiomegaly and central vascular congestion.  CT head negative for acute intracranial abnormalities.  CT angiogram of the chest is negative for PE.  Patient is placed progressive unit for observation.  Cardiology, Dr. Mariah Milling is consulted.  Review of Systems:   General: no fevers, chills, no body weight gain, has fatigue HEENT: no blurry vision, hearing changes or sore throat Respiratory: has dyspnea, coughing, no wheezing CV: has chest pain, no palpitations GI: no nausea, vomiting, abdominal pain, diarrhea, constipation GU: no dysuria, burning on urination, increased urinary frequency, hematuria  Ext: has leg edema Neuro: no unilateral weakness, numbness, or tingling, no vision change or hearing  loss Skin: no rash, no skin tear. MSK: No muscle spasm, no deformity, no limitation of range of movement in spin Heme: No easy bruising.  Travel history: No recent long distant travel.  Allergy: No Known Allergies  Past Medical History:  Diagnosis Date  . CHF (congestive heart failure) (HCC)    "coinsided w/kidney problems I was having 06/2017"  . CKD (chronic kidney disease) stage 3, GFR 30-59 ml/min 06/2017  . High cholesterol   . History of cardiomyopathy    LVEF 40 to 45% in April 2019 - subsequently normalized  . Hypertension   . Ischemic stroke (HCC)    Small left internal capsule infarct due to lacunar disease  . Morbid obesity (HCC)   . Stroke (HCC) 08/2017   "right sided weakness since; getting stronger though" (10/22/2017)  . Type 2 diabetes mellitus (HCC)     Past Surgical History:  Procedure Laterality Date  . ABDOMINAL HERNIA REPAIR  2008; 10/22/2017   "scope; OPEN REPAIR INCARCERATED VENTRAL HERNIA  . HERNIA REPAIR    . KNEE ARTHROSCOPY Right 1989  . VENTRAL HERNIA REPAIR N/A 10/22/2017   Procedure: OPEN REPAIR INCARCERATED VENTRAL HERNIA;  Surgeon: Glenna Fellows, MD;  Location: [facility] OR;  Service: General;  Laterality: N/A;    Social History:  reports that he has never smoked. He has never used smokeless tobacco. He reports previous alcohol use. He reports that he does not use drugs.  Family History:  Family History  Problem Relation Age of Onset  . Diabetes Father   . Heart disease Father   . Diabetes  Sister   . Diabetes Mother   . Stroke Mother   . Diabetes Maternal Grandmother   . Heart disease Maternal Grandmother   . Stroke Paternal Grandfather   . Heart disease Paternal Grandfather      Prior to Admission medications   Medication Sig Start Date End Date Taking? Authorizing Provider  acetaminophen (TYLENOL) 500 MG tablet Take 500 mg by mouth every 6 (six) hours as needed for mild pain or moderate pain.    [provider]  aspirin 81 MG  chewable tablet Chew 1 tablet (81 mg total) by mouth daily. 09/09/17   Myrene Buddy, MD  atorvastatin (LIPITOR) 40 MG tablet Take 1 tablet (40 mg total) by mouth daily at 6 PM. 11/18/17   Wendee Beavers, DO  carvedilol (COREG) 6.25 MG tablet Take 1 tablet (6.25 mg total) by mouth 2 (two) times daily. 11/18/17 11/18/18  Wendee Beavers, DO  lisinopril (ZESTRIL) 20 MG tablet TAKE ONE-HALF TABLET BY MOUTH DAILY Patient taking differently: Take 10 mg by mouth daily.  07/20/18   Wendee Beavers, DO  metFORMIN (GLUCOPHAGE) 1000 MG tablet Take 1 tablet (1,000 mg total) by mouth 2 (two) times daily with a meal. Patient taking differently: Take 500 mg by mouth 2 (two) times daily with a meal.  06/03/18   Wendee Beavers, DO  spironolactone (ALDACTONE) 25 MG tablet TAKE ONE TABLET BY MOUTH DAILY Patient taking differently: Take 25 mg by mouth daily.  07/20/18   Wendee Beavers, DO  torsemide (DEMADEX) 20 MG tablet TAKE TWO TABLETS BY MOUTH DAILY 09/28/18   Wendee Beavers, DO    Physical Exam: Vitals:   07/08/19 1715 07/08/19 1730 07/08/19 1800 07/08/19 1830  BP: (!) 203/111 (!) 181/85 (!) 168/90 (!) 179/86  Pulse:  (!) 57 64 67  Resp:      Temp:      TempSrc:      SpO2:  98% 96% 94%  Weight:      Height:       General: Not in acute distress HEENT:       Eyes: PERRL, EOMI, no scleral icterus.       ENT: No discharge from the ears and nose, no pharynx injection, no tonsillar enlargement.        Neck: No JVD, no bruit, no mass felt. Heme: No neck lymph node enlargement. Cardiac: S1/S2, RRR, No murmurs, No gallops or rubs. Respiratory: No rales, wheezing, rhonchi or rubs. GI: Soft, nondistended, nontender, no rebound pain, no organomegaly, BS present. GU: No hematuria Ext: 2+DP/PT pulse bilaterally.  Has 3+ pitting leg edema bilaterally, has chronic erythema in both lower legs. Musculoskeletal: No joint deformities, No joint redness or warmth, no limitation of ROM in spin. Skin: No  rashes.  Neuro: Alert, oriented X3, cranial nerves II-XII grossly intact, moves all extremities normally.  Psych: Patient is not psychotic, no suicidal or hemocidal ideation.  Labs on Admission: I have personally reviewed following labs and imaging studies  CBC: Recent Labs  Lab 07/08/19 0938  WBC 10.9*  HGB 13.5  HCT 42.7  MCV 87.5  PLT 268   Basic Metabolic Panel: Recent Labs  Lab 07/08/19 0938  NA 136  K 4.6  CL 101  CO2 28  GLUCOSE 307*  BUN 26*  CREATININE 1.50*  CALCIUM 8.4*   GFR: Estimated Creatinine Clearance: 98.1 mL/min (A) (by C-G formula based on SCr of 1.5 mg/dL (H)). Liver Function Tests: No results for input(s): AST, ALT,  ALKPHOS, BILITOT, PROT, ALBUMIN in the last 168 hours. No results for input(s): LIPASE, AMYLASE in the last 168 hours. No results for input(s): AMMONIA in the last 168 hours. Coagulation Profile: No results for input(s): INR, PROTIME in the last 168 hours. Cardiac Enzymes: No results for input(s): CKTOTAL, CKMB, CKMBINDEX, TROPONINI in the last 168 hours. BNP (last 3 results) No results for input(s): PROBNP in the last 8760 hours. HbA1C: No results for input(s): HGBA1C in the last 72 hours. CBG: Recent Labs  Lab 07/08/19 1707  GLUCAP 147*   Lipid Profile: No results for input(s): CHOL, HDL, LDLCALC, TRIG, CHOLHDL, LDLDIRECT in the last 72 hours. Thyroid Function Tests: No results for input(s): TSH, T4TOTAL, FREET4, T3FREE, THYROIDAB in the last 72 hours. Anemia Panel: No results for input(s): VITAMINB12, FOLATE, FERRITIN, TIBC, IRON, RETICCTPCT in the last 72 hours. Urine analysis:    Component Value Date/Time   COLORURINE YELLOW 10/21/2017 1207   APPEARANCEUR HAZY (A) 10/21/2017 1207   LABSPEC 1.018 10/21/2017 1207   PHURINE 8.0 10/21/2017 1207   GLUCOSEU NEGATIVE 10/21/2017 1207   HGBUR NEGATIVE 10/21/2017 1207   BILIRUBINUR NEGATIVE 10/21/2017 1207   KETONESUR 5 (A) 10/21/2017 1207   PROTEINUR 30 (A) 10/21/2017  1207   UROBILINOGEN 2.0 (H) 10/21/2017 1102   NITRITE NEGATIVE 10/21/2017 1207   LEUKOCYTESUR NEGATIVE 10/21/2017 1207   Sepsis Labs: @LABRCNTIP (procalcitonin:4,lacticidven:4) )No results found for this or any previous visit (from the past 240 hour(s)).   Radiological Exams on Admission: DG Chest 2 View  Result Date: 07/08/2019 CLINICAL DATA:  Chest pain. EXAM: CHEST - 2 VIEW COMPARISON:  October 23, 2017. FINDINGS: Mild cardiomegaly is noted with central pulmonary vascular congestion. No pneumothorax or pleural effusion is noted. No acute pulmonary disease is noted. Bony thorax is unremarkable. IMPRESSION: Mild cardiomegaly with central pulmonary vascular congestion. Electronically Signed   By: Lupita Raider M.D.   On: 07/08/2019 10:06   CT Head Wo Contrast  Result Date: 07/08/2019 CLINICAL DATA:  Acute onset LEFT side chest pain, headache, blurred vision, elevated blood pressure, normal neurological exam EXAM: CT HEAD WITHOUT CONTRAST TECHNIQUE: Contiguous axial images were obtained from the base of the skull through the vertex without intravenous contrast. Sagittal and coronal MPR images reconstructed from axial data set. COMPARISON:  08/06/2018 FINDINGS: Brain: Normal ventricular morphology. No midline shift or mass effect. Minimal small vessel chronic ischemic changes of deep cerebral white matter. Tiny low-attenuation foci are seen within the thalami bilaterally, suspect tiny old lacunar infarcts. No intracranial hemorrhage, mass lesion or evidence of acute infarction. No extra-axial fluid collections. Vascular: No hyperdense vessels. Minimal atherosclerotic calcifications of internal carotid and vertebral arteries at skull base. Skull: Intact Sinuses/Orbits: Clear Other: N/A IMPRESSION: Minimal small vessel chronic ischemic changes of deep cerebral white matter. Probable tiny old lacunar infarcts at the thalami bilaterally. No acute intracranial abnormalities. Electronically Signed   By: Ulyses Southward M.D.   On: 07/08/2019 12:39   CT Angio Chest PE W and/or Wo Contrast  Result Date: 07/08/2019 CLINICAL DATA:  Shortness of breath. EXAM: CT ANGIOGRAPHY CHEST WITH CONTRAST TECHNIQUE: Multidetector CT imaging of the chest was performed using the standard protocol during bolus administration of intravenous contrast. Multiplanar CT image reconstructions and MIPs were obtained to evaluate the vascular anatomy. CONTRAST:  OMNIPAQUE IOHEXOL 350 MG/ML SOLN COMPARISON:  Radiograph earlier this day. FINDINGS: Cardiovascular: There are no filling defects within the pulmonary arteries to suggest pulmonary embolus. Dilated main pulmonary artery at 4.2 cm. Thoracic  aorta is normal in caliber. Minor aortic atherosclerosis. Multi chamber cardiomegaly. No pericardial effusion. Mediastinum/Nodes: No enlarged mediastinal or hilar lymph nodes. No esophageal wall thickening. No visualized thyroid nodule. Lungs/Pleura: Heterogeneous pulmonary parenchyma may be due to small airways or small vessel disease. Suggestion of tracheobronchomalacia. No confluent airspace disease. No pleural effusion. Calcified granuloma in the left lung base. Multiple scattered subpleural pulmonary nodules, majority in the right lung. Largest nodules in the right lower and middle lobe measuring 5 mm series 7, image 51 and 42. These nodules are stable from lung bases of abdominal CT 10/21/2017. Other nodules were not included in the field of view. Upper Abdomen: Decreased hepatic density consistent with steatosis. Subtle nodular hepatic contours may represent cirrhosis. Gallstones are partially included. No acute findings. Musculoskeletal: There are no acute or suspicious osseous abnormalities. Review of the MIP images confirms the above findings. IMPRESSION: 1. No pulmonary embolus. 2. Dilated main pulmonary artery, can be seen with pulmonary arterial hypertension. 3. Multi chamber cardiomegaly. 4. Heterogeneous pulmonary parenchyma may be due to  small airways or small vessel disease. Suggestion of tracheobronchomalacia. 5. Multiple subpleural pulmonary nodules, majority in the right lung. Largest nodules measuring 5 mm are are stable from lung bases of abdominal CT 10/21/2017. Other nodules were not included in the field of view. No follow-up needed if patient is low-risk (and has no known or suspected primary neoplasm). Non-contrast chest CT can be considered in 12 months if patient is high-risk. This recommendation follows the consensus statement: Guidelines for Management of Incidental Pulmonary Nodules Detected on CT Images: From the Fleischner Society 2017; Radiology 2017; 284:228-243. 6. Hepatic steatosis. Subtle nodular hepatic contours may represent cirrhosis. Cholelithiasis. Electronically Signed   By: Narda Rutherford M.D.   On: 07/08/2019 16:45     EKG: Independently reviewed.  Sinus rhythm, QTC 455, LAE, LAD, poor R wave progression, T wave inversion in lead I/aVL  Assessment/Plan Principal Problem:   Chest pain Active Problems:   Acute on chronic diastolic CHF (congestive heart failure) (HCC)   Hyperlipidemia   Stroke (HCC)   Type II diabetes mellitus with renal manifestations (HCC)   Acute renal failure superimposed on stage 3a chronic kidney disease (HCC)   Elevated troponin   Chest pain and elevated trop: trop 35 -->31.  CT angiogram is negative for PE.  Possibly due to demand ischemia secondary to CHF exacerbation.  -Placed in progressive unit for observation -Aspirin, Lipitor -As needed nitroglycerin, morphine -Check A1c, FLP, UDS -Trend troponin - repeat EKG in the morning -Follow-up 2D echo.   - Cardiology, Dr. Mariah Milling is consulted.  Acute on chronic diastolic CHF (congestive heart failure) (HCC): 2D on-call 09/07/2017 showed EF of 60-65%.  Patient has 3+ lateral lower leg edema.  Elevated BNP 219, chest x-ray showed cardiomegaly and vascular congestion.  Clinically consistent with CHF exacerbation. -IV Lasix  40 mg twice daily -Follow-up 2D echo  Hyperlipidemia -Lipitor  History of  Stroke (HCC) -Aspirin, Lipitor  Type II diabetes mellitus with renal manifestations (HCC): Most recent A1c 6.9, well controled. Patient is taking Metformin at home -[facility]  Acute renal failure superimposed on stage 3a chronic kidney disease (HCC): Baseline creatinine 1.1-1.3.  His creatinine is 1.50, BUN 26, may be due to cardiorenal syndrome.  This is slightly worsening, will not hold lisinopril and diuretics. -Renal function closely.  Morbid obesity: BMI 57.39 -Diet and exercise.   -Encouraged to lose weight.      DVT ppx: SQ Lovenox Code Status: Full code Family Communication:  not done, no family member is at bed side.     Disposition Plan:  Anticipate discharge back to previous home environment Consults called:  Dr. Rockey Situ of card Admission status: Progressive unit for observation   Date of Service 07/08/2019    [facility]   If 7PM-7AM, please contact night-coverage [email or web address] 07/08/2019, 6:43 PM

## 2019-07-08 NOTE — ED Provider Notes (Signed)
[facility] Emergency Department Provider Note  ____________________________________________   First MD Initiated Contact with Patient 07/08/19 1203     (approximate)  I have reviewed the triage vital signs and the nursing notes.   HISTORY  Chief Complaint Chest Pain    HPI Jeremiah Vance is a 49 y.o. male with history of CHF, CKD, prior ischemic strokes, morbid obesity, diabetes, hypertension, hyperlipidemia who comes in with chest pain.  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  About 3-1/2 hours ago, 30 minutes prior to coming into the hospital he had sudden onset of feeling like somebody was standing on his chest that was severe, constant nothing made it better, nothing made it worse.  Patient stated that he felt diaphoretic and had a little bit of a headache and blurry vision.  Blurry vision has since gone away.  He denies any shortness of breath.  Denies any worsening leg swelling.  He states that he has never had this happen before.  No prior stents.  Patient currently rates his pain a 5 out of 10.          Past Medical History:  Diagnosis Date  . CHF (congestive heart failure) ([place])    "coinsided w/kidney problems I was having 06/2017"  . CKD (chronic kidney disease) stage 3, GFR 30-59 ml/min 06/2017  . High cholesterol   . History of cardiomyopathy    LVEF 40 to 45% in April 2019 - subsequently normalized  . Hypertension   . Ischemic stroke ([place])    Small left internal capsule infarct due to lacunar disease  . Morbid obesity ([place])   . Stroke ([place]) 08/2017   "right sided weakness since; getting stronger though" (10/22/2017)  . Type 2 diabetes mellitus [facility])     Patient Active Problem List   Diagnosis Date Noted  . Vitreous floaters of both eyes 06/03/2018  . Moderate obstructive sleep apnea 02/11/2018  . Normocytic anemia 12/16/2017  . Chronic fatigue 12/16/2017  . Primary hypertension 12/16/2017  . History of small  bowel obstruction 10/24/2017  . 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 Depression 10/14/2017  . Adjustment disorder with depressed mood 09/30/2017  . Stroke ([place]) 09/14/2017  . Acute right-sided weakness   . CHF (congestive heart failure) ([place])   . Hyperlipidemia   . Stroke (cerebrum) ([place]) 09/07/2017  . Gout   . Acute ischemic stroke ([place]) 09/06/2017  . Anasarca   . Elevated LFTs   . (HFpEF) heart failure with preserved ejection fraction ([place])   . Obesity, Class III, BMI 40-49.9 (morbid obesity) ([place])   . Hypoalbuminemia 07/11/2017  . Hyperbilirubinemia 07/10/2017  . Anemia 07/10/2017  . Uncontrolled type 2 diabetes mellitus without complication, without long-term current use of insulin 07/10/2017    Past Surgical History:  Procedure Laterality Date  . ABDOMINAL HERNIA REPAIR  2008; 10/22/2017   "scope; OPEN REPAIR INCARCERATED VENTRAL HERNIA  . HERNIA REPAIR    . KNEE ARTHROSCOPY Right 1989  . VENTRAL HERNIA REPAIR N/A 10/22/2017   Procedure: OPEN REPAIR INCARCERATED VENTRAL HERNIA;  Surgeon: Excell Seltzer, MD;  Location: [place];  Service: General;  Laterality: N/A;    Prior to Admission medications   Medication Sig Start Date End Date Taking? Authorizing Provider  acetaminophen (TYLENOL) 500 MG tablet Take 500 mg by mouth every 6 (six) hours as needed for mild pain or moderate pain.    [provider]  aspirin 81 MG  chewable tablet Chew 1 tablet (81 mg total) by mouth daily. 09/09/17   Myrene Buddy, MD  atorvastatin (LIPITOR) 40 MG tablet Take 1 tablet (40 mg total) by mouth daily at 6 PM. 11/18/17   Wendee Beavers, DO  carvedilol (COREG) 6.25 MG tablet Take 1 tablet (6.25 mg total) by mouth 2 (two) times daily. 11/18/17 11/18/18  Wendee Beavers, DO  lisinopril (ZESTRIL) 20 MG tablet TAKE ONE-HALF TABLET BY MOUTH DAILY Patient taking differently: Take 10 mg by mouth daily.  07/20/18   Wendee Beavers, DO  metFORMIN  (GLUCOPHAGE) 1000 MG tablet Take 1 tablet (1,000 mg total) by mouth 2 (two) times daily with a meal. Patient taking differently: Take 500 mg by mouth 2 (two) times daily with a meal.  06/03/18   Wendee Beavers, DO  spironolactone (ALDACTONE) 25 MG tablet TAKE ONE TABLET BY MOUTH DAILY Patient taking differently: Take 25 mg by mouth daily.  07/20/18   Wendee Beavers, DO  torsemide (DEMADEX) 20 MG tablet TAKE TWO TABLETS BY MOUTH DAILY 09/28/18   Wendee Beavers, DO    Allergies Patient has no known allergies.  Family History  Problem Relation Age of Onset  . Diabetes Father   . Heart disease Father   . Diabetes Sister   . Diabetes Mother   . Stroke Mother   . Diabetes Maternal Grandmother   . Heart disease Maternal Grandmother   . Stroke Paternal Grandfather   . Heart disease Paternal Grandfather     Social History Social History   Tobacco Use  . Smoking status: Never Smoker  . Smokeless tobacco: Never Used  Substance Use Topics  . Alcohol use: Not Currently    Comment: 10/22/2017 "sober since 2000"  . Drug use: Never      Review of Systems Constitutional: No fever/chills Eyes: Positive vision changes now resolved ENT: No sore throat. Cardiovascular: Positive chest pain Respiratory: Denies shortness of breath. Gastrointestinal: No abdominal pain.  No nausea, no vomiting.  No diarrhea.  No constipation. Genitourinary: Negative for dysuria. Musculoskeletal: Negative for back pain. Skin: Negative for rash. Neurological: Positive headache, no focal weakness or numbness. All other ROS negative ____________________________________________   PHYSICAL EXAM:  VITAL SIGNS: ED Triage Vitals  Enc Vitals Group     BP 07/08/19 0934 (!) 196/85     Pulse Rate 07/08/19 0934 65     Resp 07/08/19 0934 20     Temp 07/08/19 0934 98.2 F (36.8 C)     Temp Source 07/08/19 0934 Oral     SpO2 07/08/19 0934 94 %     Weight 07/08/19 0935 (!) 400 lb (181.4 kg)     Height  07/08/19 0935 5\' 10"  (1.778 m)     Head Circumference --      Peak Flow --      Pain Score 07/08/19 0935 6     Pain Loc --      Pain Edu? --      Excl. in GC? --     Constitutional: Alert and oriented. Well appearing and in no acute distress.  Overweight male Eyes: Conjunctivae are normal. EOMI. Head: Atraumatic. Nose: No congestion/rhinnorhea. Mouth/Throat: Mucous membranes are moist.   Neck: No stridor. Trachea Midline. FROM Cardiovascular: Normal rate, regular rhythm. Grossly normal heart sounds.  Good peripheral circulation. Respiratory: Normal respiratory effort.  No retractions. Lungs CTAB. Gastrointestinal: Soft and nontender. No distention. No abdominal bruits.  Musculoskeletal: 1+ edema bilaterally with some erythema seems to  be consistent with chronic venous stasis.  Her baseline per patient  Neurologic:  Normal speech and language. No gross focal neurologic deficits are appreciated.  Skin:  Skin is warm, dry and intact. No rash noted. Psychiatric: Mood and affect are normal. Speech and behavior are normal. GU: Deferred   ____________________________________________   LABS (all labs ordered are listed, but only abnormal results are displayed)  Labs Reviewed  BASIC METABOLIC PANEL - Abnormal; Notable for the following components:      Result Value   Glucose, Bld 307 (*)    BUN 26 (*)    Creatinine, Ser 1.50 (*)    Calcium 8.4 (*)    GFR calc non Af Amer 54 (*)    All other components within normal limits  CBC - Abnormal; Notable for the following components:   WBC 10.9 (*)    All other components within normal limits  TROPONIN I (HIGH SENSITIVITY) - Abnormal; Notable for the following components:   Troponin I (High Sensitivity) 35 (*)    All other components within normal limits  TROPONIN I (HIGH SENSITIVITY)   ____________________________________________   ED ECG REPORT I, Concha Se, the attending physician, personally viewed and interpreted this  ECG.  EKG is normal sinus rate of 68 with some sinus arrhythmia.  No ST elevation, T wave inversion in aVL and lead I, left anterior fascicular block  T wave inversion appears new in 1 aVL ____________________________________________  RADIOLOGY I, Concha Se, personally viewed and evaluated these images (plain radiographs) as part of my medical decision making, as well as reviewing the written report by the radiologist.  ED MD interpretation: Patient does have cardiomegaly with a little bit of congestion  Official radiology report(s): DG Chest 2 View  Result Date: 07/08/2019 CLINICAL DATA:  Chest pain. EXAM: CHEST - 2 VIEW COMPARISON:  October 23, 2017. FINDINGS: Mild cardiomegaly is noted with central pulmonary vascular congestion. No pneumothorax or pleural effusion is noted. No acute pulmonary disease is noted. Bony thorax is unremarkable. IMPRESSION: Mild cardiomegaly with central pulmonary vascular congestion. Electronically Signed   By: Lupita Raider M.D.   On: 07/08/2019 10:06    ____________________________________________   PROCEDURES  Procedure(s) performed (including Critical Care):  Procedures   ____________________________________________   INITIAL IMPRESSION / ASSESSMENT AND PLAN / ED COURSE   Samer D Meyerhoff was evaluated in Emergency Department on 07/08/2019 for the symptoms described in the history of present illness. He was evaluated in the context of the global COVID-19 pandemic, which necessitated consideration that the patient might be at risk for infection with the SARS-CoV-2 virus that causes COVID-19. Institutional protocols and algorithms that pertain to the evaluation of patients at risk for COVID-19 are in a state of rapid change based on information released by regulatory bodies including the CDC and federal and state organizations. These policies and algorithms were followed during the patient's care in the ED.    Most Likely DDx:  -ACS given risk  factors/age given patient's history of ischemic strokes and have hypertensive today we will get a quick CT head to make sure no evidence of intracranial hemorrhage in case patient needs to be started on heparin.  DDx that was also considered d/t potential to cause harm, but was found less likely based on history and physical (as detailed above): -PNA (no fevers, cough but CXR to evaluate) -PNX (reassured with equal b/l breath sounds, CXR to evaluate) -Symptomatic anemia (will get H&H) -Pulmonary embolism as no sob  at rest, not pleuritic in nature, no hypoxia -Aortic Dissection as no tearing pain and no radiation to the mid back, pulses equal -Pericarditis no rub on exam, EKG changes or hx to suggest dx -Tamponade (no notable SOB, tachycardic, hypotensive) -Esophageal rupture (no h/o diffuse vomitting/no crepitus)   Kidney function is around baseline.  Sugars elevated but no signs of DKA.  Initial troponin is elevated at 35  Given new T wave inversions and elevated troponin will discuss with possible team for admission.  Will give some nitro and IV morphine to see if we get his pain level down to 0.  CT head was negative so we need to heparinize him we can.  Patient given full dose of aspirin.   We will discuss to the hospital team for admission.  ____________________________________________   FINAL CLINICAL IMPRESSION(S) / ED DIAGNOSES   Final diagnoses:  Chest pain, unspecified type     MEDICATIONS GIVEN DURING THIS VISIT:  Medications  nitroGLYCERIN (NITROSTAT) SL tablet 0.4 mg (has no administration in time range)  morphine 4 MG/ML injection 6 mg (has no administration in time range)  ondansetron (ZOFRAN) injection 4 mg (has no administration in time range)  aspirin chewable tablet 324 mg (has no administration in time range)     ED Discharge Orders    None       Note:  This document was prepared using Dragon voice recognition software and may include unintentional  dictation errors.   Concha Se, MD 07/08/19 [id number]

## 2019-07-08 NOTE — ED Triage Notes (Signed)
Here for acute onset left side chest pain that started while working today.  Pain starts in mid chest and radiates to left.  Also had headache and blurry vision but blurry vision has resolved.  bp elevated, pt takes BP meds at night.  NAD. Unlabored at this time.

## 2019-07-08 NOTE — ED Notes (Signed)
Lav tube sent to lab.

## 2019-07-09 ENCOUNTER — Observation Stay (HOSPITAL_BASED_OUTPATIENT_CLINIC_OR_DEPARTMENT_OTHER)
Admit: 2019-07-09 | Discharge: 2019-07-09 | Disposition: A | Discharge status: Home / Self Care | Payer: Self-pay | Attending: Cardiovascular Disease | Admitting: Cardiovascular Disease

## 2019-07-09 DIAGNOSIS — R079 Chest pain, unspecified: Secondary | ICD-10-CM

## 2019-07-09 LAB — LIPID PANEL
Cholesterol: 155 mg/dL (ref 0–200)
HDL: 32 mg/dL — ABNORMAL LOW (ref >40)
LDL Cholesterol: 102 mg/dL — ABNORMAL HIGH (ref 0–99)
Total CHOL/HDL Ratio: 4.8 RATIO
Triglycerides: 106 mg/dL (ref <150)
VLDL: 21 mg/dL (ref 0–40)

## 2019-07-09 LAB — GLUCOSE, CAPILLARY: Glucose-Capillary: 157 mg/dL — ABNORMAL HIGH (ref 70–99)

## 2019-07-09 LAB — ECHOCARDIOGRAM COMPLETE
Height: 71 in
Weight: 6020.8 oz

## 2019-07-09 LAB — HEMOGLOBIN A1C
Hgb A1c MFr Bld: 9.4 % — ABNORMAL HIGH (ref 4.8–5.6)
Mean Plasma Glucose: 223 mg/dL

## 2019-07-09 MED ORDER — ATORVASTATIN CALCIUM 40 MG PO TABS: 40.0000 mg | ORAL_TABLET | Freq: Every day | ORAL | 3 refills | Status: DC

## 2019-07-09 MED ORDER — CARVEDILOL 6.25 MG PO TABS: 6.2500 mg | ORAL_TABLET | Freq: Two times a day (BID) | ORAL | 3 refills | Status: DC

## 2019-07-09 MED ORDER — LISINOPRIL 20 MG PO TABS: 20.0000 mg | ORAL_TABLET | Freq: Every day | ORAL | 3 refills | Status: DC

## 2019-07-09 MED ORDER — SPIRONOLACTONE 25 MG PO TABS: 25.0000 mg | ORAL_TABLET | Freq: Every day | ORAL | 3 refills | Status: DC

## 2019-07-09 MED ORDER — METFORMIN HCL 1000 MG PO TABS: 1000.0000 mg | ORAL_TABLET | Freq: Two times a day (BID) | ORAL | 3 refills | Status: DC

## 2019-07-09 MED ORDER — TORSEMIDE 20 MG PO TABS: 40.0000 mg | ORAL_TABLET | Freq: Every day | ORAL | 3 refills | Status: DC

## 2019-07-09 NOTE — Plan of Care (Signed)

## 2019-07-09 NOTE — Discharge Summary (Signed)
Physician Discharge Summary  Jeremiah Vance CZY:[id number] DOB: June 29, 1970 DOA: 07/08/2019  PCP: Danna Hefty, DO  Admit date: 07/08/2019 Discharge date: 07/09/2019  Admitted From: Home  Disposition:  Home   Recommendations for Outpatient Follow-up:  1. Follow up with PCP in 1-2 weeks      Home Health: None  Equipment/Devices: None  Discharge Condition: Good  CODE STATUS: FULL Diet recommendation: Diabetic, Cardiac  Brief/Interim Summary: Jeremiah Vance is a 49 y.o. male with medical history significant of hypertension, hyperlipidemia, diabetes mellitus, stroke, CKD-3, CHF, morbid obesity, who presents with chest pain.  Patient states that his chest pain started today will he was working.  It is located in the mid and the left side of chest, pressure-like, 5 out of 10 severity, nonradiating.  Is associated with shortness of breath, dry cough.  No fever or chills.  Patient does not have nausea, vomiting, diarrhea, abdominal pain, symptoms of UTI.  Patient states that he had one episode of headache and blurry vision, which have resolved.   In the ER, troponin minimally elevated, BNP slightly up, severely hypertensive.  CTA negative for PE or pneumonia.  ECG unremarkable.        PRINCIPAL HOSPITAL DIAGNOSIS: Hypertensive urgency    Discharge Diagnoses:   Chest pain, atypical CT angiogram is negative for PE.  ECG unremarkable.  Low level hsTroponin is nonspecific, may just be from obesity, HTN.    Evaluated by Cardiology, Echocardiogram unremarkable.  Restarted on home meds, which he had been off for a year.  There was some temportal association with food, suggests gastritis or esophagitis.   Chronic diastolic CHF (congestive heart failure) ([place]):  Acute CHF ruled out.   Resume Lasix, this was refilled.  Hyperlipidemia Resume Lipitor, this was refilled.  History of  Stroke [facility]) Resume aspirin, Lipitor  Type II diabetes mellitus with renal  manifestations ([place]):  Resume metformin, patient has not reiflled for 1 year.    Acute renal failure superimposed on stage 3a chronic kidney disease ([place]):  Morbid obesity: BMI 57.39           Discharge Instructions  Discharge Instructions    Diet - low sodium heart healthy   Complete by: As directed    Discharge instructions   Complete by: As directed    From Dr. Loleta Books: You were admitted for chest pain. The testing here in the hospital suggested that this was not from your heart or lungs.   Given the occurrence after eating, I suspect it was from the esophagus or stomach.  Precisely what, it is hard to say.  Resume all your home medicines: Take lisinopril 20 mg daily for blood pressure Take carvedilol 6.25 mg twice daily for blood pressure Take atorvastatin 40 mg nightly for cholesterol Take spironolactone 25 mg daily for blood pressure Take torsemide 40 mg once daily for fluid overload Take Metformin 1000 mg twice daily for diabetes  The Metformin may cause diarrhea, so it may be wise to start this medicine slowly (500 mg daily for a few days then 500 mg twice daily for a few days then 1000 mg twice daily)   Call your primary care doctor (numbers listed below and to do section) and make sure that you get labs drawn 1 week Let them know if the pain that brought you to the hospital persists   Reduce salt in your diet!   Increase activity slowly   Complete by: As directed      Allergies as of  07/09/2019   No Known Allergies     Medication List    TAKE these medications   acetaminophen 500 MG tablet Commonly known as: TYLENOL Take 500 mg by mouth every 6 (six) hours as needed for mild pain or moderate pain.   aspirin 81 MG chewable tablet Chew 1 tablet (81 mg total) by mouth daily.   atorvastatin 40 MG tablet Commonly known as: LIPITOR Take 1 tablet (40 mg total) by mouth daily at 6 PM.   carvedilol 6.25 MG tablet Commonly known as: Coreg Take 1  tablet (6.25 mg total) by mouth 2 (two) times daily.   lisinopril 20 MG tablet Commonly known as: ZESTRIL Take 1 tablet (20 mg total) by mouth daily. What changed: how much to take   metFORMIN 1000 MG tablet Commonly known as: GLUCOPHAGE Take 1 tablet (1,000 mg total) by mouth 2 (two) times daily with a meal. What changed: how much to take   spironolactone 25 MG tablet Commonly known as: ALDACTONE Take 1 tablet (25 mg total) by mouth daily.   torsemide 20 MG tablet Commonly known as: DEMADEX Take 2 tablets (40 mg total) by mouth daily.      Follow-up Information    Mullis, Kiersten P, DO. Schedule an appointment as soon as possible for a visit in 1 week(s).   Specialty: Family Medicine Contact information: [address] [place] [address] [phone]        Jonelle Sidle, MD .   Specialty: Cardiology Contact information: [address] [place] [address] [phone]          No Known Allergies  Consultations:  Cardiology   Procedures/Studies: DG Chest 2 View  Result Date: 07/08/2019 CLINICAL DATA:  Chest pain. EXAM: CHEST - 2 VIEW COMPARISON:  October 23, 2017. FINDINGS: Mild cardiomegaly is noted with central pulmonary vascular congestion. No pneumothorax or pleural effusion is noted. No acute pulmonary disease is noted. Bony thorax is unremarkable. IMPRESSION: Mild cardiomegaly with central pulmonary vascular congestion. Electronically Signed   By: Lupita Raider M.D.   On: 07/08/2019 10:06   CT Head Wo Contrast  Result Date: 07/08/2019 CLINICAL DATA:  Acute onset LEFT side chest pain, headache, blurred vision, elevated blood pressure, normal neurological exam EXAM: CT HEAD WITHOUT CONTRAST TECHNIQUE: Contiguous axial images were obtained from the base of the skull through the vertex without intravenous contrast. Sagittal and coronal MPR images reconstructed from axial data set. COMPARISON:  08/06/2018 FINDINGS: Brain: Normal ventricular  morphology. No midline shift or mass effect. Minimal small vessel chronic ischemic changes of deep cerebral white matter. Tiny low-attenuation foci are seen within the thalami bilaterally, suspect tiny old lacunar infarcts. No intracranial hemorrhage, mass lesion or evidence of acute infarction. No extra-axial fluid collections. Vascular: No hyperdense vessels. Minimal atherosclerotic calcifications of internal carotid and vertebral arteries at skull base. Skull: Intact Sinuses/Orbits: Clear Other: N/A IMPRESSION: Minimal small vessel chronic ischemic changes of deep cerebral white matter. Probable tiny old lacunar infarcts at the thalami bilaterally. No acute intracranial abnormalities. Electronically Signed   By: Ulyses Southward M.D.   On: 07/08/2019 12:39   CT Angio Chest PE W and/or Wo Contrast  Result Date: 07/08/2019 CLINICAL DATA:  Shortness of breath. EXAM: CT ANGIOGRAPHY CHEST WITH CONTRAST TECHNIQUE: Multidetector CT imaging of the chest was performed using the standard protocol during bolus administration of intravenous contrast. Multiplanar CT image reconstructions and MIPs were obtained to evaluate the vascular anatomy. CONTRAST:  OMNIPAQUE IOHEXOL 350  MG/ML SOLN COMPARISON:  Radiograph earlier this day. FINDINGS: Cardiovascular: There are no filling defects within the pulmonary arteries to suggest pulmonary embolus. Dilated main pulmonary artery at 4.2 cm. Thoracic aorta is normal in caliber. Minor aortic atherosclerosis. Multi chamber cardiomegaly. No pericardial effusion. Mediastinum/Nodes: No enlarged mediastinal or hilar lymph nodes. No esophageal wall thickening. No visualized thyroid nodule. Lungs/Pleura: Heterogeneous pulmonary parenchyma may be due to small airways or small vessel disease. Suggestion of tracheobronchomalacia. No confluent airspace disease. No pleural effusion. Calcified granuloma in the left lung base. Multiple scattered subpleural pulmonary nodules, majority in the right  lung. Largest nodules in the right lower and middle lobe measuring 5 mm series 7, image 51 and 42. These nodules are stable from lung bases of abdominal CT 10/21/2017. Other nodules were not included in the field of view. Upper Abdomen: Decreased hepatic density consistent with steatosis. Subtle nodular hepatic contours may represent cirrhosis. Gallstones are partially included. No acute findings. Musculoskeletal: There are no acute or suspicious osseous abnormalities. Review of the MIP images confirms the above findings. IMPRESSION: 1. No pulmonary embolus. 2. Dilated main pulmonary artery, can be seen with pulmonary arterial hypertension. 3. Multi chamber cardiomegaly. 4. Heterogeneous pulmonary parenchyma may be due to small airways or small vessel disease. Suggestion of tracheobronchomalacia. 5. Multiple subpleural pulmonary nodules, majority in the right lung. Largest nodules measuring 5 mm are are stable from lung bases of abdominal CT 10/21/2017. Other nodules were not included in the field of view. No follow-up needed if patient is low-risk (and has no known or suspected primary neoplasm). Non-contrast chest CT can be considered in 12 months if patient is high-risk. This recommendation follows the consensus statement: Guidelines for Management of Incidental Pulmonary Nodules Detected on CT Images: From the Fleischner Society 2017; Radiology 2017; 284:228-243. 6. Hepatic steatosis. Subtle nodular hepatic contours may represent cirrhosis. Cholelithiasis. Electronically Signed   By: Narda Rutherford M.D.   On: 07/08/2019 16:45       Subjective: No chest pain  Discharge Exam: Vitals:   07/09/19 0322 07/09/19 0817  BP: 118/64 125/73  Pulse: 63 63  Resp:  18  Temp: 97.8 F (36.6 C) 98.1 F (36.7 C)  SpO2: 93% 96%   Vitals:   07/08/19 2036 07/08/19 2106 07/09/19 0322 07/09/19 0817  BP: (!) 191/100 (!) 162/92 118/64 125/73  Pulse:  73 63 63  Resp:    18  Temp:  98.3 F (36.8 C) 97.8 F  (36.6 C) 98.1 F (36.7 C)  TempSrc:  Oral Oral Oral  SpO2:  97% 93% 96%  Weight:  (!) 172.4 kg (!) 170.7 kg   Height:  5\' 11"  (1.803 m)      General: Pt is alert, awake, not in acute distress Cardiovascular: RRR, nl S1-S2, no murmurs appreciated.   No LE edema.   Respiratory: Normal respiratory rate and rhythm.  CTAB without rales or wheezes. Abdominal: Abdomen soft and non-tender.  No distension or HSM.   Neuro/Psych: Strength symmetric in upper and lower extremities.  Judgment and insight appear normal.   The results of significant diagnostics from this hospitalization (including imaging, microbiology, ancillary and laboratory) are listed below for reference.     Microbiology: Recent Results (from the past 240 hour(s))  SARS CORONAVIRUS 2 (TAT 6-24 HRS) Nasopharyngeal Nasopharyngeal Swab     Status: None   Collection Time: 07/08/19  1:22 PM   Specimen: Nasopharyngeal Swab  Result Value Ref Range Status   SARS Coronavirus 2 NEGATIVE NEGATIVE Final  Comment: (NOTE) SARS-CoV-2 target nucleic acids are NOT DETECTED. The SARS-CoV-2 RNA is generally detectable in upper and lower respiratory specimens during the acute phase of infection. Negative results do not preclude SARS-CoV-2 infection, do not rule out co-infections with other pathogens, and should not be used as the sole basis for treatment or other patient management decisions. Negative results must be combined with clinical observations, patient history, and epidemiological information. The expected result is Negative. Fact Sheet for Patients: HairSlick.no Fact Sheet for Healthcare Providers: quierodirigir.com This test is not yet approved or cleared by the [place] FDA and  has been authorized for detection and/or diagnosis of SARS-CoV-2 by FDA under an Emergency Use Authorization (EUA). This EUA will remain  in effect (meaning this test can be used) for the  duration of the COVID-19 declaration under Section 56 4(b)(1) of the Act, 21 U.S.C. section 360bbb-3(b)(1), unless the authorization is terminated or revoked sooner. Performed at [facility], [address], [place], [place] [address]      Labs: BNP (last 3 results) Recent Labs    07/08/19 0938  BNP 219.0*   Basic Metabolic Panel: Recent Labs  Lab 07/08/19 0938  NA 136  K 4.6  CL 101  CO2 28  GLUCOSE 307*  BUN 26*  CREATININE 1.50*  CALCIUM 8.4*   Liver Function Tests: No results for input(s): AST, ALT, ALKPHOS, BILITOT, PROT, ALBUMIN in the last 168 hours. No results for input(s): LIPASE, AMYLASE in the last 168 hours. No results for input(s): AMMONIA in the last 168 hours. CBC: Recent Labs  Lab 07/08/19 0938  WBC 10.9*  HGB 13.5  HCT 42.7  MCV 87.5  PLT 268   Cardiac Enzymes: No results for input(s): CKTOTAL, CKMB, CKMBINDEX, TROPONINI in the last 168 hours. BNP: Invalid input(s): POCBNP CBG: Recent Labs  Lab 07/08/19 1707 07/08/19 2127 07/09/19 0817  GLUCAP 147* 150* 157*   D-Dimer No results for input(s): DDIMER in the last 72 hours. Hgb A1c Recent Labs    07/08/19 1332  HGBA1C 9.4*   Lipid Profile Recent Labs    07/09/19 0429  CHOL 155  HDL 32*  LDLCALC 102*  TRIG 106  CHOLHDL 4.8   Thyroid function studies No results for input(s): TSH, T4TOTAL, T3FREE, THYROIDAB in the last 72 hours.  Invalid input(s): FREET3 Anemia work up No results for input(s): VITAMINB12, FOLATE, FERRITIN, TIBC, IRON, RETICCTPCT in the last 72 hours. Urinalysis    Component Value Date/Time   COLORURINE YELLOW 10/21/2017 1207   APPEARANCEUR HAZY (A) 10/21/2017 1207   LABSPEC 1.018 10/21/2017 1207   PHURINE 8.0 10/21/2017 1207   GLUCOSEU NEGATIVE 10/21/2017 1207   HGBUR NEGATIVE 10/21/2017 1207   BILIRUBINUR NEGATIVE 10/21/2017 1207   KETONESUR 5 (A) 10/21/2017 1207   PROTEINUR 30 (A) 10/21/2017 1207   UROBILINOGEN 2.0 (H) 10/21/2017 1102    NITRITE NEGATIVE 10/21/2017 1207   LEUKOCYTESUR NEGATIVE 10/21/2017 1207   Sepsis Labs Invalid input(s): PROCALCITONIN,  WBC,  LACTICIDVEN Microbiology Recent Results (from the past 240 hour(s))  SARS CORONAVIRUS 2 (TAT 6-24 HRS) Nasopharyngeal Nasopharyngeal Swab     Status: None   Collection Time: 07/08/19  1:22 PM   Specimen: Nasopharyngeal Swab  Result Value Ref Range Status   SARS Coronavirus 2 NEGATIVE NEGATIVE Final    Comment: (NOTE) SARS-CoV-2 target nucleic acids are NOT DETECTED. The SARS-CoV-2 RNA is generally detectable in upper and lower respiratory specimens during the acute phase of infection. Negative results do not preclude SARS-CoV-2  infection, do not rule out co-infections with other pathogens, and should not be used as the sole basis for treatment or other patient management decisions. Negative results must be combined with clinical observations, patient history, and epidemiological information. The expected result is Negative. Fact Sheet for Patients: HairSlick.no Fact Sheet for Healthcare Providers: quierodirigir.com This test is not yet approved or cleared by the [place] FDA and  has been authorized for detection and/or diagnosis of SARS-CoV-2 by FDA under an Emergency Use Authorization (EUA). This EUA will remain  in effect (meaning this test can be used) for the duration of the COVID-19 declaration under Section 56 4(b)(1) of the Act, 21 U.S.C. section 360bbb-3(b)(1), unless the authorization is terminated or revoked sooner. Performed at [facility], [address], [place], [place] [address]      Time coordinating discharge: 25 minutes      SIGNED:   Alberteen Sam, MD  [facility] 07/09/2019, 9:16 AM

## 2019-07-09 NOTE — Progress Notes (Signed)
*  PRELIMINARY RESULTS* Echocardiogram 2D Echocardiogram has been performed.  Jeremiah Vance M Danzel Marszalek 07/09/2019, 8:46 AM 

## 2019-07-09 NOTE — Progress Notes (Signed)
Reviewed heart failure education with patient. Pt did not want to watch videos. I will continue to assess.

## 2019-07-12 ENCOUNTER — Other Ambulatory Visit: Payer: Self-pay

## 2019-07-12 ENCOUNTER — Ambulatory Visit (INDEPENDENT_AMBULATORY_CARE_PROVIDER_SITE_OTHER): Payer: Self-pay | Admitting: Physician Assistant

## 2019-07-12 ENCOUNTER — Encounter: Payer: Self-pay | Admitting: Physician Assistant

## 2019-07-12 ENCOUNTER — Telehealth: Payer: Self-pay | Admitting: Family

## 2019-07-12 VITALS — BP 160/90 | HR 85 | Ht 70.0 in | Wt 381.0 lb

## 2019-07-12 DIAGNOSIS — R079 Chest pain, unspecified: Secondary | ICD-10-CM

## 2019-07-12 DIAGNOSIS — I428 Other cardiomyopathies: Secondary | ICD-10-CM

## 2019-07-12 DIAGNOSIS — G4733 Obstructive sleep apnea (adult) (pediatric): Secondary | ICD-10-CM

## 2019-07-12 DIAGNOSIS — I5032 Chronic diastolic (congestive) heart failure: Secondary | ICD-10-CM

## 2019-07-12 DIAGNOSIS — I10 Essential (primary) hypertension: Secondary | ICD-10-CM

## 2019-07-12 DIAGNOSIS — E1165 Type 2 diabetes mellitus with hyperglycemia: Secondary | ICD-10-CM

## 2019-07-12 MED ORDER — CARVEDILOL 12.5 MG PO TABS: 12.5000 mg | ORAL_TABLET | Freq: Two times a day (BID) | ORAL | 3 refills | Status: DC

## 2019-07-12 NOTE — Patient Instructions (Signed)
Medication Instructions:  Increase coreg to 12.5 mg - two times daily   Labwork: none  Testing/Procedures: none  Follow-Up: Your physician recommends that you schedule a follow-up appointment in: 2-3 weeks    Any Other Special Instructions Will Be Listed Below (If Applicable).  PLEASE EXERCISE 150 WEEKLY    If you need a refill on your cardiac medications before your next appointment, please call your pharmacy.  DASH  DASH Eating Plan DASH stands for "Dietary Approaches to Stop Hypertension." The DASH eating plan is a healthy eating plan that has been shown to reduce high blood pressure (hypertension). It may also reduce your risk for type 2 diabetes, heart disease, and stroke. The DASH eating plan may also help with weight loss. What are tips for following this plan?  General guidelines  Avoid eating more than 2,300 mg (milligrams) of salt (sodium) a day. If you have hypertension, you may need to reduce your sodium intake to 1,500 mg a day.  Limit alcohol intake to no more than 1 drink a day for nonpregnant women and 2 drinks a day for men. One drink equals 12 oz of beer, 5 oz of wine, or 1 oz of hard liquor.  Work with your health care provider to maintain a healthy body weight or to lose weight. Ask what an ideal weight is for you.  Get at least 30 minutes of exercise that causes your heart to beat faster (aerobic exercise) most days of the week. Activities may include walking, swimming, or biking.  Work with your health care provider or diet and nutrition specialist (dietitian) to adjust your eating plan to your individual calorie needs. Reading food labels   Check food labels for the amount of sodium per serving. Choose foods with less than 5 percent of the Daily Value of sodium. Generally, foods with less than 300 mg of sodium per serving fit into this eating plan.  To find whole grains, look for the word "whole" as the first word in the ingredient  list. Shopping  Buy products labeled as "low-sodium" or "no salt added."  Buy fresh foods. Avoid canned foods and premade or frozen meals. Cooking  Avoid adding salt when cooking. Use salt-free seasonings or herbs instead of table salt or sea salt. Check with your health care provider or pharmacist before using salt substitutes.  Do not fry foods. Cook foods using healthy methods such as baking, boiling, grilling, and broiling instead.  Cook with heart-healthy oils, such as olive, canola, soybean, or sunflower oil. Meal planning  Eat a balanced diet that includes: ? 5 or more servings of fruits and vegetables each day. At each meal, try to fill half of your plate with fruits and vegetables. ? Up to 6-8 servings of whole grains each day. ? Less than 6 oz of lean meat, poultry, or fish each day. A 3-oz serving of meat is about the same size as a deck of cards. One egg equals 1 oz. ? 2 servings of low-fat dairy each day. ? A serving of nuts, seeds, or beans 5 times each week. ? Heart-healthy fats. Healthy fats called Omega-3 fatty acids are found in foods such as flaxseeds and coldwater fish, like sardines, salmon, and mackerel.  Limit how much you eat of the following: ? Canned or prepackaged foods. ? Food that is high in trans fat, such as fried foods. ? Food that is high in saturated fat, such as fatty meat. ? Sweets, desserts, sugary drinks, and other foods with  added sugar. ? Full-fat dairy products.  Do not salt foods before eating.  Try to eat at least 2 vegetarian meals each week.  Eat more home-cooked food and less restaurant, buffet, and fast food.  When eating at a restaurant, ask that your food be prepared with less salt or no salt, if possible. What foods are recommended? The items listed may not be a complete list. Talk with your dietitian about what dietary choices are best for you. Grains Whole-grain or whole-wheat bread. Whole-grain or whole-wheat pasta. Brown  rice. Orpah Cobb. Bulgur. Whole-grain and low-sodium cereals. Pita bread. Low-fat, low-sodium crackers. Whole-wheat flour tortillas. Vegetables Fresh or frozen vegetables (raw, steamed, roasted, or grilled). Low-sodium or reduced-sodium tomato and vegetable juice. Low-sodium or reduced-sodium tomato sauce and tomato paste. Low-sodium or reduced-sodium canned vegetables. Fruits All fresh, dried, or frozen fruit. Canned fruit in natural juice (without added sugar). Meat and other protein foods Skinless chicken or [place]. Ground chicken or [place]. Pork with fat trimmed off. Fish and seafood. Egg whites. Dried beans, peas, or lentils. Unsalted nuts, nut butters, and seeds. Unsalted canned beans. Lean cuts of beef with fat trimmed off. Low-sodium, lean deli meat. Dairy Low-fat (1%) or fat-free (skim) milk. Fat-free, low-fat, or reduced-fat cheeses. Nonfat, low-sodium ricotta or cottage cheese. Low-fat or nonfat yogurt. Low-fat, low-sodium cheese. Fats and oils Soft margarine without trans fats. Vegetable oil. Low-fat, reduced-fat, or light mayonnaise and salad dressings (reduced-sodium). Canola, safflower, olive, soybean, and sunflower oils. Avocado. Seasoning and other foods Herbs. Spices. Seasoning mixes without salt. Unsalted popcorn and pretzels. Fat-free sweets. What foods are not recommended? The items listed may not be a complete list. Talk with your dietitian about what dietary choices are best for you. Grains Baked goods made with fat, such as croissants, muffins, or some breads. Dry pasta or rice meal packs. Vegetables Creamed or fried vegetables. Vegetables in a cheese sauce. Regular canned vegetables (not low-sodium or reduced-sodium). Regular canned tomato sauce and paste (not low-sodium or reduced-sodium). Regular tomato and vegetable juice (not low-sodium or reduced-sodium). Rosita Fire. Olives. Fruits Canned fruit in a light or heavy syrup. Fried fruit. Fruit in cream or butter  sauce. Meat and other protein foods Fatty cuts of meat. Ribs. Fried meat. Tomasa Blase. Sausage. Bologna and other processed lunch meats. Salami. Fatback. Hotdogs. Bratwurst. Salted nuts and seeds. Canned beans with added salt. Canned or smoked fish. Whole eggs or egg yolks. Chicken or [place] with skin. Dairy Whole or 2% milk, cream, and half-and-half. Whole or full-fat cream cheese. Whole-fat or sweetened yogurt. Full-fat cheese. Nondairy creamers. Whipped toppings. Processed cheese and cheese spreads. Fats and oils Butter. Stick margarine. Lard. Shortening. Ghee. Bacon fat. Tropical oils, such as coconut, palm kernel, or palm oil. Seasoning and other foods Salted popcorn and pretzels. Onion salt, garlic salt, seasoned salt, table salt, and sea salt. Worcestershire sauce. Tartar sauce. Barbecue sauce. Teriyaki sauce. Soy sauce, including reduced-sodium. Steak sauce. Canned and packaged gravies. Fish sauce. Oyster sauce. Cocktail sauce. Horseradish that you find on the shelf. Ketchup. Mustard. Meat flavorings and tenderizers. Bouillon cubes. Hot sauce and Tabasco sauce. Premade or packaged marinades. Premade or packaged taco seasonings. Relishes. Regular salad dressings. Where to find more information:  National Heart, Lung, and Blood Institute: PopSteam.is  American Heart Association: www.heart.org Summary  The DASH eating plan is a healthy eating plan that has been shown to reduce high blood pressure (hypertension). It may also reduce your risk for type 2 diabetes, heart disease, and stroke.  With the DASH  eating plan, you should limit salt (sodium) intake to 2,300 mg a day. If you have hypertension, you may need to reduce your sodium intake to 1,500 mg a day.  When on the DASH eating plan, aim to eat more fresh fruits and vegetables, whole grains, lean proteins, low-fat dairy, and heart-healthy fats.  Work with your health care provider or diet and nutrition specialist (dietitian) to adjust  your eating plan to your individual calorie needs. This information is not intended to replace advice given to you by your health care provider. Make sure you discuss any questions you have with your health care provider. Document Revised: 02/28/2017 Document Reviewed: 03/11/2016 Elsevier Patient Education  2020 [employer].   Fat and Cholesterol Restricted Eating Plan Eating a diet that limits fat and cholesterol may help lower your risk for heart disease and other conditions. Your body needs fat and cholesterol for basic functions, but eating too much of these things can be harmful to your health. Your health care provider may order lab tests to check your blood fat (lipid) and cholesterol levels. This helps your health care provider understand your risk for certain conditions and whether you need to make diet changes. Work with your health care provider or dietitian to make an eating plan that is right for you. Your plan includes:  Limit your fat intake to ______% or less of your total calories a day.  Limit your saturated fat intake to ______% or less of your total calories a day.  Limit the amount of cholesterol in your diet to less than _________mg a day.  Eat ___________ g of fiber a day. What are tips for following this plan? General guidelines   If you are overweight, work with your health care provider to lose weight safely. Losing just 5-10% of your body weight can improve your overall health and help prevent diseases such as diabetes and heart disease.  Avoid: ? Foods with added sugar. ? Fried foods. ? Foods that contain partially hydrogenated oils, including stick margarine, some tub margarines, cookies, crackers, and other baked goods.  Limit alcohol intake to no more than 1 drink a day for nonpregnant women and 2 drinks a day for men. One drink equals 12 oz of beer, 5 oz of wine, or 1 oz of hard liquor. Reading food labels  Check food labels for: ? Trans fats,  partially hydrogenated oils, or high amounts of saturated fat. Avoid foods that contain saturated fat and trans fat. ? The amount of cholesterol in each serving. Try to eat no more than 200 mg of cholesterol each day. ? The amount of fiber in each serving. Try to eat at least 20-30 g of fiber each day.  Choose foods with healthy fats, such as: ? Monounsaturated and polyunsaturated fats. These include olive and canola oil, flaxseeds, walnuts, almonds, and seeds. ? Omega-3 fats. These are found in foods such as salmon, mackerel, sardines, tuna, flaxseed oil, and ground flaxseeds.  Choose grain products that have whole grains. Look for the word "whole" as the first word in the ingredient list. Cooking  Cook foods using methods other than frying. Baking, boiling, grilling, and broiling are some healthy options.  Eat more home-cooked food and less restaurant, buffet, and fast food.  Avoid cooking using saturated fats. ? Animal sources of saturated fats include meats, butter, and cream. ? Plant sources of saturated fats include palm oil, palm kernel oil, and coconut oil. Meal planning   At meals, imagine dividing your  plate into fourths: ? Fill one-half of your plate with vegetables and green salads. ? Fill one-fourth of your plate with whole grains. ? Fill one-fourth of your plate with lean protein foods.  Eat fish that is high in omega-3 fats at least two times a week.  Eat more foods that contain fiber, such as whole grains, beans, apples, broccoli, carrots, peas, and barley. These foods help promote healthy cholesterol levels in the blood. Recommended foods Grains  Whole grains, such as whole wheat or whole grain breads, crackers, cereals, and pasta. Unsweetened oatmeal, bulgur, barley, quinoa, or brown rice. Corn or whole wheat flour tortillas. Vegetables  Fresh or frozen vegetables (raw, steamed, roasted, or grilled). Green salads. Fruits  All fresh, canned (in natural juice),  or frozen fruits. Meats and other protein foods  Ground beef (85% or leaner), grass-fed beef, or beef trimmed of fat. Skinless chicken or [place]. Ground chicken or [place]. Pork trimmed of fat. All fish and seafood. Egg whites. Dried beans, peas, or lentils. Unsalted nuts or seeds. Unsalted canned beans. Natural nut butters without added sugar and oil. Dairy  Low-fat or nonfat dairy products, such as skim or 1% milk, 2% or reduced-fat cheeses, low-fat and fat-free ricotta or cottage cheese, or plain low-fat and nonfat yogurt. Fats and oils  Tub margarine without trans fats. Light or reduced-fat mayonnaise and salad dressings. Avocado. Olive, canola, sesame, or safflower oils. The items listed above may not be a complete list of recommended foods or beverages. Contact your dietitian for more options. Foods to avoid Grains  White bread. White pasta. White rice. Cornbread. Bagels, pastries, and croissants. Crackers and snack foods that contain trans fat and hydrogenated oils. Vegetables  Vegetables cooked in cheese, cream, or butter sauce. Fried vegetables. Fruits  Canned fruit in heavy syrup. Fruit in cream or butter sauce. Fried fruit. Meats and other protein foods  Fatty cuts of meat. Ribs, chicken wings, bacon, sausage, bologna, salami, chitterlings, fatback, hot dogs, bratwurst, and packaged lunch meats. Liver and organ meats. Whole eggs and egg yolks. Chicken and [place] with skin. Fried meat. Dairy  Whole or 2% milk, cream, half-and-half, and cream cheese. Whole milk cheeses. Whole-fat or sweetened yogurt. Full-fat cheeses. Nondairy creamers and whipped toppings. Processed cheese, cheese spreads, and cheese curds. Beverages  Alcohol. Sugar-sweetened drinks such as sodas, lemonade, and fruit drinks. Fats and oils  Butter, stick margarine, lard, shortening, ghee, or bacon fat. Coconut, palm kernel, and palm oils. Sweets and desserts  Corn syrup, sugars, honey, and molasses. Candy.  Jam and jelly. Syrup. Sweetened cereals. Cookies, pies, cakes, donuts, muffins, and ice cream. The items listed above may not be a complete list of foods and beverages to avoid. Contact your dietitian for more information. Summary  Your body needs fat and cholesterol for basic functions. However, eating too much of these things can be harmful to your health.  Work with your health care provider and dietitian to follow a diet low in fat and cholesterol. Doing this may help lower your risk for heart disease and other conditions.  Choose healthy fats, such as monounsaturated and polyunsaturated fats, and foods high in omega-3 fatty acids.  Eat fiber-rich foods, such as whole grains, beans, peas, fruits, and vegetables.  Limit or avoid alcohol, fried foods, and foods high in saturated fats, partially hydrogenated oils, and sugar. This information is not intended to replace advice given to you by your health care provider. Make sure you discuss any questions you have with your health  care provider. Document Revised: 02/28/2017 Document Reviewed: 12/03/2016 Elsevier Patient Education  [place].

## 2019-07-12 NOTE — Progress Notes (Signed)
Cardiology Office Note    Date:  07/12/2019   ID:  Jeremiah Vance, DOB 1970-07-28, MRN [id number]  PCP:  Joana Reamer, DO  Cardiologist: Nona Dell, MD EPS: None  No chief complaint on file.   History of Present Illness:  Jeremiah Vance is a 49 y.o. male with morbid obesity, cardiomyopathy ejection fraction 45% in April 2019 improved ejection fraction 65% on echo June 2019, stroke, chronic kidney disease stage III, diabetes type 2, hypertension, obstructive sleep apnea not wearing his CPAP,   Admitted to [facility] 07/08/19 with atypical chest pain-possibly GI, severely elevated BP-have been off all his meds for 8 months after losing insurance, troponins negative, EKG unchanged, CT no significant coronary calcification, echo normal LV, no WMA.   Patient comes in accompanied by his sister.  No further chest pain.  Taking all his meds.  Works as a Holiday representative at [employer].  Wants to follow-up in the [place] office since he lives there.   Past Medical History:  Diagnosis Date  . CHF (congestive heart failure) (HCC)    "coinsided w/kidney problems I was having 06/2017"  . CKD (chronic kidney disease) stage 3, GFR 30-59 ml/min 06/2017  . High cholesterol   . History of cardiomyopathy    LVEF 40 to 45% in April 2019 - subsequently normalized  . Hypertension   . Ischemic stroke (HCC)    Small left internal capsule infarct due to lacunar disease  . Morbid obesity (HCC)   . Stroke (HCC) 08/2017   "right sided weakness since; getting stronger though" (10/22/2017)  . Type 2 diabetes mellitus (HCC)     Past Surgical History:  Procedure Laterality Date  . ABDOMINAL HERNIA REPAIR  2008; 10/22/2017   "scope; OPEN REPAIR INCARCERATED VENTRAL HERNIA  . HERNIA REPAIR    . KNEE ARTHROSCOPY Right 1989  . VENTRAL HERNIA REPAIR N/A 10/22/2017   Procedure: OPEN REPAIR INCARCERATED VENTRAL HERNIA;  Surgeon: Glenna Fellows, MD;  Location: [facility] OR;  Service: General;  Laterality: N/A;     Current Medications: Current Meds  Medication Sig  . acetaminophen (TYLENOL) 500 MG tablet Take 500 mg by mouth every 6 (six) hours as needed for mild pain or moderate pain.  [name] Kitchen aspirin 81 MG chewable tablet Chew 1 tablet (81 mg total) by mouth daily.  [name] Kitchen atorvastatin (LIPITOR) 40 MG tablet Take 1 tablet (40 mg total) by mouth daily at 6 PM.  . carvedilol (COREG) 6.25 MG tablet Take 1 tablet (6.25 mg total) by mouth 2 (two) times daily.  [name] Kitchen lisinopril (ZESTRIL) 20 MG tablet Take 1 tablet (20 mg total) by mouth daily.  . metFORMIN (GLUCOPHAGE) 1000 MG tablet Take 1 tablet (1,000 mg total) by mouth 2 (two) times daily with a meal.  . spironolactone (ALDACTONE) 25 MG tablet Take 1 tablet (25 mg total) by mouth daily.  [name] Kitchen torsemide (DEMADEX) 20 MG tablet Take 2 tablets (40 mg total) by mouth daily.     Allergies:   Patient has no known allergies.   Social History   Socioeconomic History  . Marital status: Legally Separated    Spouse name: Not on file  . Number of children: Not on file  . Years of education: Not on file  . Highest education level: Not on file  Occupational History  . Not on file  Tobacco Use  . Smoking status: Never Smoker  . Smokeless tobacco: Never Used  Substance and Sexual Activity  . Alcohol use: Not Currently  Comment: 10/22/2017 "sober since 2000"  . Drug use: Never  . Sexual activity: Not Currently  Other Topics Concern  . Not on file  Social History Narrative  . Not on file   Social Determinants of Health   Financial Resource Strain:   . Difficulty of Paying Living Expenses:   Food Insecurity:   . Worried About Programme researcher, broadcasting/film/video in the Last Year:   . Barista in the Last Year:   Transportation Needs:   . Freight forwarder (Medical):   [name] Kitchen Lack of Transportation (Non-Medical):   Physical Activity:   . Days of Exercise per Week:   . Minutes of Exercise per Session:   Stress:   . Feeling of Stress :   Social Connections:   .  Frequency of Communication with Friends and Family:   . Frequency of Social Gatherings with Friends and Family:   . Attends Religious Services:   . Active Member of Clubs or Organizations:   . Attends Banker Meetings:   [name] Kitchen Marital Status:      Family History:  The patient's family history includes Diabetes in his father, maternal grandmother, mother, and sister; Heart disease in his father, maternal grandmother, and paternal grandfather; Stroke in his mother and paternal grandfather.   ROS:   Please see the history of present illness.    ROS All other systems reviewed and are negative.   PHYSICAL EXAM:   VS:  BP (!) 160/90 (BP Location: Left Arm)   Pulse 85   Ht 5\' 10"  (1.778 m)   Wt (!) 381 lb (172.8 kg)   SpO2 96%   BMI 54.67 kg/m   Physical Exam  GEN: Obese, in no acute distress  Neck: no JVD, carotid bruits, or masses Cardiac:RRR; no murmurs, rubs, or gallops  Respiratory:  clear to auscultation bilaterally, normal work of breathing GI: soft, nontender, nondistended, + BS Ext: Chronic edema, decreased distal pulses bilaterally Neuro:  Alert and Oriented x 3 Psych: euthymic mood, full affect  Wt Readings from Last 3 Encounters:  07/12/19 (!) 381 lb (172.8 kg)  07/09/19 (!) 376 lb 4.8 oz (170.7 kg)  08/06/18 (!) 332 lb 14.3 oz (151 kg)      Studies/Labs Reviewed:   EKG:  EKG is not ordered today.    Recent Labs: 08/06/2018: ALT 19 07/08/2019: B Natriuretic Peptide 219.0; BUN 26; Creatinine, Ser 1.50; Hemoglobin 13.5; Platelets 268; Potassium 4.6; Sodium 136   Lipid Panel    Component Value Date/Time   CHOL 155 07/09/2019 0429   CHOL 115 06/17/2018 1455   TRIG 106 07/09/2019 0429   HDL 32 (L) 07/09/2019 0429   HDL 41 06/17/2018 1455   CHOLHDL 4.8 07/09/2019 0429   VLDL 21 07/09/2019 0429   LDLCALC 102 (H) 07/09/2019 0429   LDLCALC 53 06/17/2018 1455    Additional studies/ records that were reviewed today include:  Echo 07/09/19 IMPRESSIONS      1. Left ventricular ejection fraction, by estimation, is 50 to 55%. The  left ventricle has low normal function. The left ventricle demonstrates  global hypokinesis. There is mild left ventricular hypertrophy. Left  ventricular diastolic parameters are  consistent with Grade II diastolic dysfunction (pseudonormalization).   2. Right ventricular systolic function is normal. The right ventricular  size is mildly enlarged. Tricuspid regurgitation signal is inadequate for  assessing PA pressure.   3. Left atrial size was mildly dilated.   FINDINGS   Left Ventricle: Left ventricular  ejection fraction, by estimation, is 50  to 55%. The left ventricle has low normal function. The left ventricle  demonstrates global hypokinesis. The left ventricular internal cavity size  was normal in size. There is mild  left ventricular hypertrophy. Left ventricular diastolic parameters are  consistent with Grade II diastolic dysfunction (pseudonormalization).   Right Ventricle: The right ventricular size is mildly enlarged. No  increase in right ventricular wall thickness. Right ventricular systolic  function is normal. Tricuspid regurgitation signal is inadequate for  assessing PA pressure.   Left Atrium: Left atrial size was mildly dilated.   Right Atrium: Right atrial size was normal in size.   Pericardium: There is no evidence of pericardial effusion.   Mitral Valve: The mitral valve is normal in structure. Normal mobility of  the mitral valve leaflets. No evidence of mitral valve regurgitation. No  evidence of mitral valve stenosis. MV peak gradient, 2.7 mmHg. The mean  mitral valve gradient is 1.0 mmHg.   Tricuspid Valve: The tricuspid valve is normal in structure. Tricuspid  valve regurgitation is not demonstrated. No evidence of tricuspid  stenosis.   Aortic Valve: The aortic valve is normal in structure. Aortic valve  regurgitation is not visualized. No aortic stenosis is present. Aortic   valve mean gradient measures 4.0 mmHg. Aortic valve peak gradient measures  7.7 mmHg. Aortic valve area, by VTI  measures 3.66 cm.   Pulmonic Valve: The pulmonic valve was normal in structure. Pulmonic valve  regurgitation is not visualized. No evidence of pulmonic stenosis.   Aorta: The aortic root is normal in size and structure.   Venous: The inferior vena cava is normal in size with greater than 50%  respiratory variability, suggesting right atrial pressure of 3 mmHg.   IAS/Shunts: No atrial level shunt detected by color flow Doppler.     LEFT VENTRICLE  PLAX 2D  LVIDd:         5.53 cm  Diastology  LVIDs:         4.44 cm  LV e' lateral:   6.42 cm/s  LV PW:         1.70 cm  LV E/e' lateral: 12.9  LV IVS:        0.88 cm  LV e' medial:    5.55 cm/s  LVOT diam:     2.90 cm  LV E/e' medial:  14.9  LV SV:         100  LV SV Index:   36  LVOT Area:     6.61 cm     LEFT ATRIUM             Index  LA diam:        4.60 cm 1.67 cm/m  LA Vol (A2C):   62.7 ml 22.73 ml/m  LA Vol (A4C):   93.9 ml 34.04 ml/m  LA Biplane Vol: 84.3 ml 30.56 ml/m   AORTIC VALVE                   PULMONIC VALVE  AV Area (Vmax):    3.51 cm    PV Vmax:       1.05 m/s  AV Area (Vmean):   3.42 cm    PV Vmean:      71.000 cm/s  AV Area (VTI):     3.66 cm    PV VTI:        0.192 m  AV Vmax:  139.00 cm/s PV Peak grad:  4.4 mmHg  AV Vmean:          99.500 cm/s PV Mean grad:  2.0 mmHg  AV VTI:            0.274 m  AV Peak Grad:      7.7 mmHg  AV Mean Grad:      4.0 mmHg  LVOT Vmax:         73.90 cm/s  LVOT Vmean:        51.500 cm/s  LVOT VTI:          0.152 m  LVOT/AV VTI ratio: 0.55    AORTA  Ao Root diam: 3.80 cm   MITRAL VALVE  MV Area (PHT): 3.65 cm    SHUNTS  MV Peak grad:  2.7 mmHg    Systemic VTI:  0.15 m  MV Mean grad:  1.0 mmHg    Systemic Diam: 2.90 cm  MV Vmax:       0.82 m/s  MV Vmean:      48.5 cm/s  MV Decel Time: 208 msec  MV E velocity: 82.90 cm/s  MV A velocity:  73.20 cm/s  MV E/A ratio:  1.13   Ida Rogue MD  Electronically signed by Ida Rogue MD  Signature Date/Time: 07/09/2019/5:03:12 PM        Final    CT angio chest 07/08/19 IMPRESSION: 1. No pulmonary embolus. 2. Dilated main pulmonary artery, can be seen with pulmonary arterial hypertension. 3. Multi chamber cardiomegaly. 4. Heterogeneous pulmonary parenchyma may be due to small airways or small vessel disease. Suggestion of tracheobronchomalacia. 5. Multiple subpleural pulmonary nodules, majority in the right lung. Largest nodules measuring 5 mm are are stable from lung bases of abdominal CT 10/21/2017. Other nodules were not included in the field of view. No follow-up needed if patient is low-risk (and has no known or suspected primary neoplasm). Non-contrast chest CT can be considered in 12 months if patient is high-risk. This recommendation follows the consensus statement: Guidelines for Management of Incidental Pulmonary Nodules Detected on CT Images: From the Fleischner Society 2017; Radiology 2017; [id number]:[id number]. 6. Hepatic steatosis. Subtle nodular hepatic contours may represent cirrhosis. Cholelithiasis.     Electronically Signed   By: Keith Rake M.D.   On: 07/08/2019 16:45    ASSESSMENT:    1. Chest pain, unspecified type   2. NICM (nonischemic cardiomyopathy) ([place])   3. Chronic diastolic CHF (congestive heart failure) ([place])   4. Essential hypertension   5. OSA (obstructive sleep apnea)   6. Uncontrolled type 2 diabetes mellitus with hyperglycemia ([place])   7. Morbid obesity ([place])      PLAN:  In order of problems listed above:  Chest pain with recent hospitalization after being off his medications for 8 months and severe hypertension negative troponins, echo normal LVEF, no WMA-no recurrence  History of cardiomyopathy EF 45% 2019, repeat echo 07/09/19 normal LVEF 50-55% no WMA  Chronic diastolic CHF-grade 2 DD on spironolactone and  torsemide  Hypertension blood pressure elevated today.  Will increase carvedilol to 12.5 mg twice daily.  Follow-up in our [place] office in 2 to 3 weeks for further titration  OSA not wearing CPAP will need a repeat sleep study  DM2 uncontrolled managed by PCP A1c 9.4 07/08/2019  CKD stage 3 1.5-07/08/19  Morbid obesity diet and exercise discussed in detail with patient who would benefit from some type of guided weight loss program  Hyperlipidemia on atorvastatin LDL of  1022/12/21  Medication Adjustments/Labs and Tests Ordered: Current medicines are reviewed at length with the patient today.  Concerns regarding medicines are outlined above.  Medication changes, Labs and Tests ordered today are listed in the Patient Instructions below. Patient Instructions  Medication Instructions:  Increase coreg to 12.5 mg - two times daily   Labwork: none  Testing/Procedures: none  Follow-Up: Your physician recommends that you schedule a follow-up appointment in: 2-3 weeks    Any Other Special Instructions Will Be Listed Below (If Applicable).  PLEASE EXERCISE 150 WEEKLY    If you need a refill on your cardiac medications before your next appointment, please call your pharmacy.  DASH  DASH Eating Plan DASH stands for "Dietary Approaches to Stop Hypertension." The DASH eating plan is a healthy eating plan that has been shown to reduce high blood pressure (hypertension). It may also reduce your risk for type 2 diabetes, heart disease, and stroke. The DASH eating plan may also help with weight loss. What are tips for following this plan?  General guidelines  Avoid eating more than 2,300 mg (milligrams) of salt (sodium) a day. If you have hypertension, you may need to reduce your sodium intake to 1,500 mg a day.  Limit alcohol intake to no more than 1 drink a day for nonpregnant women and 2 drinks a day for men. One drink equals 12 oz of beer, 5 oz of wine, or 1 oz of hard  liquor.  Work with your health care provider to maintain a healthy body weight or to lose weight. Ask what an ideal weight is for you.  Get at least 30 minutes of exercise that causes your heart to beat faster (aerobic exercise) most days of the week. Activities may include walking, swimming, or biking.  Work with your health care provider or diet and nutrition specialist (dietitian) to adjust your eating plan to your individual calorie needs. Reading food labels   Check food labels for the amount of sodium per serving. Choose foods with less than 5 percent of the Daily Value of sodium. Generally, foods with less than 300 mg of sodium per serving fit into this eating plan.  To find whole grains, look for the word "whole" as the first word in the ingredient list. Shopping  Buy products labeled as "low-sodium" or "no salt added."  Buy fresh foods. Avoid canned foods and premade or frozen meals. Cooking  Avoid adding salt when cooking. Use salt-free seasonings or herbs instead of table salt or sea salt. Check with your health care provider or pharmacist before using salt substitutes.  Do not fry foods. Cook foods using healthy methods such as baking, boiling, grilling, and broiling instead.  Cook with heart-healthy oils, such as olive, canola, soybean, or sunflower oil. Meal planning  Eat a balanced diet that includes: ? 5 or more servings of fruits and vegetables each day. At each meal, try to fill half of your plate with fruits and vegetables. ? Up to 6-8 servings of whole grains each day. ? Less than 6 oz of lean meat, poultry, or fish each day. A 3-oz serving of meat is about the same size as a deck of cards. One egg equals 1 oz. ? 2 servings of low-fat dairy each day. ? A serving of nuts, seeds, or beans 5 times each week. ? Heart-healthy fats. Healthy fats called Omega-3 fatty acids are found in foods such as flaxseeds and coldwater fish, like sardines, salmon, and  mackerel.  Limit how much  you eat of the following: ? Canned or prepackaged foods. ? Food that is high in trans fat, such as fried foods. ? Food that is high in saturated fat, such as fatty meat. ? Sweets, desserts, sugary drinks, and other foods with added sugar. ? Full-fat dairy products.  Do not salt foods before eating.  Try to eat at least 2 vegetarian meals each week.  Eat more home-cooked food and less restaurant, buffet, and fast food.  When eating at a restaurant, ask that your food be prepared with less salt or no salt, if possible. What foods are recommended? The items listed may not be a complete list. Talk with your dietitian about what dietary choices are best for you. Grains Whole-grain or whole-wheat bread. Whole-grain or whole-wheat pasta. Brown rice. Orpah Cobb. Bulgur. Whole-grain and low-sodium cereals. Pita bread. Low-fat, low-sodium crackers. Whole-wheat flour tortillas. Vegetables Fresh or frozen vegetables (raw, steamed, roasted, or grilled). Low-sodium or reduced-sodium tomato and vegetable juice. Low-sodium or reduced-sodium tomato sauce and tomato paste. Low-sodium or reduced-sodium canned vegetables. Fruits All fresh, dried, or frozen fruit. Canned fruit in natural juice (without added sugar). Meat and other protein foods Skinless chicken or [place]. Ground chicken or [place]. Pork with fat trimmed off. Fish and seafood. Egg whites. Dried beans, peas, or lentils. Unsalted nuts, nut butters, and seeds. Unsalted canned beans. Lean cuts of beef with fat trimmed off. Low-sodium, lean deli meat. Dairy Low-fat (1%) or fat-free (skim) milk. Fat-free, low-fat, or reduced-fat cheeses. Nonfat, low-sodium ricotta or cottage cheese. Low-fat or nonfat yogurt. Low-fat, low-sodium cheese. Fats and oils Soft margarine without trans fats. Vegetable oil. Low-fat, reduced-fat, or light mayonnaise and salad dressings (reduced-sodium). Canola, safflower, olive, soybean, and  sunflower oils. Avocado. Seasoning and other foods Herbs. Spices. Seasoning mixes without salt. Unsalted popcorn and pretzels. Fat-free sweets. What foods are not recommended? The items listed may not be a complete list. Talk with your dietitian about what dietary choices are best for you. Grains Baked goods made with fat, such as croissants, muffins, or some breads. Dry pasta or rice meal packs. Vegetables Creamed or fried vegetables. Vegetables in a cheese sauce. Regular canned vegetables (not low-sodium or reduced-sodium). Regular canned tomato sauce and paste (not low-sodium or reduced-sodium). Regular tomato and vegetable juice (not low-sodium or reduced-sodium). Rosita Fire. Olives. Fruits Canned fruit in a light or heavy syrup. Fried fruit. Fruit in cream or butter sauce. Meat and other protein foods Fatty cuts of meat. Ribs. Fried meat. Tomasa Blase. Sausage. Bologna and other processed lunch meats. Salami. Fatback. Hotdogs. Bratwurst. Salted nuts and seeds. Canned beans with added salt. Canned or smoked fish. Whole eggs or egg yolks. Chicken or [place] with skin. Dairy Whole or 2% milk, cream, and half-and-half. Whole or full-fat cream cheese. Whole-fat or sweetened yogurt. Full-fat cheese. Nondairy creamers. Whipped toppings. Processed cheese and cheese spreads. Fats and oils Butter. Stick margarine. Lard. Shortening. Ghee. Bacon fat. Tropical oils, such as coconut, palm kernel, or palm oil. Seasoning and other foods Salted popcorn and pretzels. Onion salt, garlic salt, seasoned salt, table salt, and sea salt. Worcestershire sauce. Tartar sauce. Barbecue sauce. Teriyaki sauce. Soy sauce, including reduced-sodium. Steak sauce. Canned and packaged gravies. Fish sauce. Oyster sauce. Cocktail sauce. Horseradish that you find on the shelf. Ketchup. Mustard. Meat flavorings and tenderizers. Bouillon cubes. Hot sauce and Tabasco sauce. Premade or packaged marinades. Premade or packaged taco seasonings.  Relishes. Regular salad dressings. Where to find more information:  National Heart, Lung, and Blood Institute: PopSteam.is  American Heart Association: www.heart.org Summary  The DASH eating plan is a healthy eating plan that has been shown to reduce high blood pressure (hypertension). It may also reduce your risk for type 2 diabetes, heart disease, and stroke.  With the DASH eating plan, you should limit salt (sodium) intake to 2,300 mg a day. If you have hypertension, you may need to reduce your sodium intake to 1,500 mg a day.  When on the DASH eating plan, aim to eat more fresh fruits and vegetables, whole grains, lean proteins, low-fat dairy, and heart-healthy fats.  Work with your health care provider or diet and nutrition specialist (dietitian) to adjust your eating plan to your individual calorie needs. This information is not intended to replace advice given to you by your health care provider. Make sure you discuss any questions you have with your health care provider. Document Revised: 02/28/2017 Document Reviewed: 03/11/2016 Elsevier Patient Education  2020 [employer].   Fat and Cholesterol Restricted Eating Plan Eating a diet that limits fat and cholesterol may help lower your risk for heart disease and other conditions. Your body needs fat and cholesterol for basic functions, but eating too much of these things can be harmful to your health. Your health care provider may order lab tests to check your blood fat (lipid) and cholesterol levels. This helps your health care provider understand your risk for certain conditions and whether you need to make diet changes. Work with your health care provider or dietitian to make an eating plan that is right for you. Your plan includes:  Limit your fat intake to ______% or less of your total calories a day.  Limit your saturated fat intake to ______% or less of your total calories a day.  Limit the amount of cholesterol in  your diet to less than _________mg a day.  Eat ___________ g of fiber a day. What are tips for following this plan? General guidelines   If you are overweight, work with your health care provider to lose weight safely. Losing just 5-10% of your body weight can improve your overall health and help prevent diseases such as diabetes and heart disease.  Avoid: ? Foods with added sugar. ? Fried foods. ? Foods that contain partially hydrogenated oils, including stick margarine, some tub margarines, cookies, crackers, and other baked goods.  Limit alcohol intake to no more than 1 drink a day for nonpregnant women and 2 drinks a day for men. One drink equals 12 oz of beer, 5 oz of wine, or 1 oz of hard liquor. Reading food labels  Check food labels for: ? Trans fats, partially hydrogenated oils, or high amounts of saturated fat. Avoid foods that contain saturated fat and trans fat. ? The amount of cholesterol in each serving. Try to eat no more than 200 mg of cholesterol each day. ? The amount of fiber in each serving. Try to eat at least 20-30 g of fiber each day.  Choose foods with healthy fats, such as: ? Monounsaturated and polyunsaturated fats. These include olive and canola oil, flaxseeds, walnuts, almonds, and seeds. ? Omega-3 fats. These are found in foods such as salmon, mackerel, sardines, tuna, flaxseed oil, and ground flaxseeds.  Choose grain products that have whole grains. Look for the word "whole" as the first word in the ingredient list. Cooking  Cook foods using methods other than frying. Baking, boiling, grilling, and broiling are some healthy options.  Eat more home-cooked food and less restaurant, buffet, and fast  food.  Avoid cooking using saturated fats. ? Animal sources of saturated fats include meats, butter, and cream. ? Plant sources of saturated fats include palm oil, palm kernel oil, and coconut oil. Meal planning   At meals, imagine dividing your plate  into fourths: ? Fill one-half of your plate with vegetables and green salads. ? Fill one-fourth of your plate with whole grains. ? Fill one-fourth of your plate with lean protein foods.  Eat fish that is high in omega-3 fats at least two times a week.  Eat more foods that contain fiber, such as whole grains, beans, apples, broccoli, carrots, peas, and barley. These foods help promote healthy cholesterol levels in the blood. Recommended foods Grains  Whole grains, such as whole wheat or whole grain breads, crackers, cereals, and pasta. Unsweetened oatmeal, bulgur, barley, quinoa, or brown rice. Corn or whole wheat flour tortillas. Vegetables  Fresh or frozen vegetables (raw, steamed, roasted, or grilled). Green salads. Fruits  All fresh, canned (in natural juice), or frozen fruits. Meats and other protein foods  Ground beef (85% or leaner), grass-fed beef, or beef trimmed of fat. Skinless chicken or [place]. Ground chicken or [place]. Pork trimmed of fat. All fish and seafood. Egg whites. Dried beans, peas, or lentils. Unsalted nuts or seeds. Unsalted canned beans. Natural nut butters without added sugar and oil. Dairy  Low-fat or nonfat dairy products, such as skim or 1% milk, 2% or reduced-fat cheeses, low-fat and fat-free ricotta or cottage cheese, or plain low-fat and nonfat yogurt. Fats and oils  Tub margarine without trans fats. Light or reduced-fat mayonnaise and salad dressings. Avocado. Olive, canola, sesame, or safflower oils. The items listed above may not be a complete list of recommended foods or beverages. Contact your dietitian for more options. Foods to avoid Grains  White bread. White pasta. White rice. Cornbread. Bagels, pastries, and croissants. Crackers and snack foods that contain trans fat and hydrogenated oils. Vegetables  Vegetables cooked in cheese, cream, or butter sauce. Fried vegetables. Fruits  Canned fruit in heavy syrup. Fruit in cream or butter sauce.  Fried fruit. Meats and other protein foods  Fatty cuts of meat. Ribs, chicken wings, bacon, sausage, bologna, salami, chitterlings, fatback, hot dogs, bratwurst, and packaged lunch meats. Liver and organ meats. Whole eggs and egg yolks. Chicken and [place] with skin. Fried meat. Dairy  Whole or 2% milk, cream, half-and-half, and cream cheese. Whole milk cheeses. Whole-fat or sweetened yogurt. Full-fat cheeses. Nondairy creamers and whipped toppings. Processed cheese, cheese spreads, and cheese curds. Beverages  Alcohol. Sugar-sweetened drinks such as sodas, lemonade, and fruit drinks. Fats and oils  Butter, stick margarine, lard, shortening, ghee, or bacon fat. Coconut, palm kernel, and palm oils. Sweets and desserts  Corn syrup, sugars, honey, and molasses. Candy. Jam and jelly. Syrup. Sweetened cereals. Cookies, pies, cakes, donuts, muffins, and ice cream. The items listed above may not be a complete list of foods and beverages to avoid. Contact your dietitian for more information. Summary  Your body needs fat and cholesterol for basic functions. However, eating too much of these things can be harmful to your health.  Work with your health care provider and dietitian to follow a diet low in fat and cholesterol. Doing this may help lower your risk for heart disease and other conditions.  Choose healthy fats, such as monounsaturated and polyunsaturated fats, and foods high in omega-3 fatty acids.  Eat fiber-rich foods, such as whole grains, beans, peas, fruits, and vegetables.  Limit or  avoid alcohol, fried foods, and foods high in saturated fats, partially hydrogenated oils, and sugar. This information is not intended to replace advice given to you by your health care provider. Make sure you discuss any questions you have with your health care provider. Document Revised: 02/28/2017 Document Reviewed: 12/03/2016 Elsevier Patient Education  [address].         Signed, Jacolyn Reedy, [place]  07/12/2019 12:38 PM    [facility] [address] [place], [place], [place]  [address] Phone: [phone]; Fax: [phone]

## 2019-07-12 NOTE — Telephone Encounter (Signed)
Spoke with patient who said he was doing well today. He is following a low sodium diet, checking daily weights, taking medications as suppose too with no issues. He confirmed his new patient [facility]F Clinic appointment with [place] for Friday at 1130A   Deetta Perla, NT

## 2019-07-14 ENCOUNTER — Ambulatory Visit: Payer: Medicaid Other | Attending: Internal Medicine

## 2019-07-14 ENCOUNTER — Ambulatory Visit: Payer: Medicaid Other

## 2019-07-14 ENCOUNTER — Ambulatory Visit: Payer: Medicaid Other | Admitting: Family Medicine

## 2019-07-14 DIAGNOSIS — Z23 Encounter for immunization: Secondary | ICD-10-CM

## 2019-07-14 NOTE — Progress Notes (Signed)
   Covid-19 Vaccination Clinic  Name:  Jeremiah Vance    MRN: [id number] DOB: 1970/04/07  07/14/2019  [name] was observed post Covid-19 immunization for 30 minutes based on pre-vaccination screening without incident. He was provided with Vaccine Information Sheet and instruction to access the V-Safe system.   [name] was instructed to call 911 with any severe reactions post vaccine: [name] Kitchen Difficulty breathing  . Swelling of face and throat  . A fast heartbeat  . A bad rash all over body  . Dizziness and weakness   Immunizations Administered    Name Date Dose VIS Date Route   Pfizer COVID-19 Vaccine 07/14/2019  9:42 AM 0.3 mL 03/12/2019 Intramuscular   Manufacturer: [employer], [employer]   Lot: [id number]   NDC: [address]-9

## 2019-07-15 NOTE — Progress Notes (Signed)
Patient ID: Jeremiah Vance, male    DOB: 1970/05/16, 49 y.o.   MRN: [id number]  HPI  Mr Morones is a 49 y/o male with a history of DM, hyperlipidemia, HTN, CKD, stroke, morbid obesity and chronic heart failure.   Echo report from 07/09/19 reviewed and showed an EF of 50-55% along with mild LVH.  Admitted 07/08/19 due to atypical chest pain. Cardiology consult obtained. Medications were restarted. Discharged the following day.   He presents today for his initial visit with a chief complaint of minimal fatigue upon moderate exertion. He describes this as chronic in nature having been present for several months. He has associated dry cough along with this. He denies any difficulty sleeping, dizziness, abdominal distention, palpitations, pedal edema, chest pain, shortness of breath or weight gain.   Is picking up a new scale at work tomorrow.Says that he was off lisinopril for a period of time and his dry cough continued.   Requests that we call a PCP within [place] to schedule a NP appointment for him.   Past Medical History:  Diagnosis Date  . CHF (congestive heart failure) ([facility])    "coinsided w/kidney problems I was having 06/2017"  . CKD (chronic kidney disease) stage 3, GFR 30-59 ml/min 06/2017  . High cholesterol   . History of cardiomyopathy    LVEF 40 to 45% in April 2019 - subsequently normalized  . Hypertension   . Ischemic stroke ([facility])    Small left internal capsule infarct due to lacunar disease  . Morbid obesity ([facility])   . Stroke ([facility]) 08/2017   "right sided weakness since; getting stronger though" (10/22/2017)  . Type 2 diabetes mellitus ([facility])    Past Surgical History:  Procedure Laterality Date  . ABDOMINAL HERNIA REPAIR  2008; 10/22/2017   "scope; OPEN REPAIR INCARCERATED VENTRAL HERNIA  . HERNIA REPAIR    . KNEE ARTHROSCOPY Right 1989  . VENTRAL HERNIA REPAIR N/A 10/22/2017   Procedure: OPEN REPAIR INCARCERATED VENTRAL HERNIA;  Surgeon: Excell Seltzer, MD;   Location: [facility];  Service: General;  Laterality: N/A;   Family History  Problem Relation Age of Onset  . Diabetes Father   . Heart disease Father   . Diabetes Sister   . Diabetes Mother   . Stroke Mother   . Diabetes Maternal Grandmother   . Heart disease Maternal Grandmother   . Stroke Paternal Grandfather   . Heart disease Paternal Grandfather    Social History   Tobacco Use  . Smoking status: Never Smoker  . Smokeless tobacco: Never Used  Substance Use Topics  . Alcohol use: Not Currently    Comment: 10/22/2017 "sober since 2000"   No Known Allergies Prior to Admission medications   Medication Sig Start Date End Date Taking? Authorizing Provider  acetaminophen (TYLENOL) 500 MG tablet Take 500 mg by mouth every 6 (six) hours as needed for mild pain or moderate pain.   Yes [provider]  aspirin 81 MG chewable tablet Chew 1 tablet (81 mg total) by mouth daily. 09/09/17  Yes Guadalupe Dawn, MD  atorvastatin (LIPITOR) 40 MG tablet Take 1 tablet (40 mg total) by mouth daily at 6 PM. 07/09/19  Yes Danford, Suann Larry, MD  carvedilol (COREG) 12.5 MG tablet Take 1 tablet (12.5 mg total) by mouth 2 (two) times daily. 07/12/19 10/10/19 Yes Imogene Burn, PA-C  lisinopril (ZESTRIL) 20 MG tablet Take 1 tablet (20 mg total) by mouth daily. 07/09/19  Yes Myrene Buddy  P, MD  metFORMIN (GLUCOPHAGE) 1000 MG tablet Take 1 tablet (1,000 mg total) by mouth 2 (two) times daily with a meal. 07/09/19  Yes Danford, Earl Lites, MD  spironolactone (ALDACTONE) 25 MG tablet Take 1 tablet (25 mg total) by mouth daily. 07/09/19  Yes Danford, Earl Lites, MD  torsemide (DEMADEX) 20 MG tablet Take 2 tablets (40 mg total) by mouth daily. 07/09/19  Yes Danford, Earl Lites, MD    Review of Systems  Constitutional: Positive for fatigue (minimal). Negative for appetite change.  HENT: Negative for congestion, postnasal drip and sore throat.   Eyes: Negative.   Respiratory: Positive for cough  (dry ). Negative for shortness of breath.   Cardiovascular: Negative for chest pain, palpitations and leg swelling.  Gastrointestinal: Negative for abdominal distention and abdominal pain.  Endocrine: Negative.   Genitourinary: Negative.   Musculoskeletal: Negative for back pain and neck pain.  Skin: Negative.   Allergic/Immunologic: Negative.   Neurological: Negative for dizziness and light-headedness.  Hematological: Negative for adenopathy. Does not bruise/bleed easily.  Psychiatric/Behavioral: Negative for dysphoric mood and sleep disturbance (sleeping on 2 pillows). The patient is not nervous/anxious.    Vitals:   07/16/19 1122  BP: 138/76  Pulse: 66  Resp: 18  SpO2: 97%  Weight: (!) 366 lb 8 oz (166.2 kg)  Height: 5\' 10"  (1.778 m)   Wt Readings from Last 3 Encounters:  07/16/19 (!) 366 lb 8 oz (166.2 kg)  07/12/19 (!) 381 lb (172.8 kg)  07/09/19 (!) 376 lb 4.8 oz (170.7 kg)   Lab Results  Component Value Date   CREATININE 1.50 (H) 07/08/2019   CREATININE 2.00 (H) 08/06/2018   CREATININE 1.91 (H) 08/06/2018     Physical Exam Vitals and nursing note reviewed.  Constitutional:      Appearance: Normal appearance.  HENT:     Head: Normocephalic and atraumatic.  Cardiovascular:     Rate and Rhythm: Normal rate and regular rhythm.  Pulmonary:     Effort: Pulmonary effort is normal. No respiratory distress.     Breath sounds: No wheezing or rales.  Abdominal:     General: There is no distension.     Palpations: Abdomen is soft.  Musculoskeletal:        General: No tenderness.     Cervical back: Normal range of motion and neck supple.     Right lower leg: Edema (1+ pitting) present.     Left lower leg: Edema (1+ pitting) present.  Skin:    General: Skin is warm and dry.  Neurological:     General: No focal deficit present.     Mental Status: He is alert and oriented to person, place, and time.  Psychiatric:        Mood and Affect: Mood normal.        Behavior:  Behavior normal.        Thought Content: Thought content normal.    Assessment & Plan:  1: Chronic heart failure with preserved ejection fraction with structural changes- - NYHA class II - euvolemic today - is getting scales tomorrow at work; understands to weigh every morning and call for an overnight weight gain of >2 pounds or a weekly weight gain of >5 pounds - not adding salt to his food and is trying to read labels for sodium content; reviewed the importance of closely following a 2000mg  sodium diet - saw cardiology 10/06/2018) 07/12/19 & has appt scheduled with Geni Bers, PA 07/26/19 - could consider changing  lisinopril to entresto since he has LVH - BNP 07/08/19 was 219.0  2: HTN- - BP looks good today - saw PCP Abelardo Diesel) 06/17/2018; NP appt scheduled, per patient's request, with [facility] on 08/02/19 - BMP 07/08/19 reviewed and showed sodium 136, potassium 4.6, creatinine 1.5 and GFR 54  3: DM- - A1c 07/08/19 was 9.4% - nonfasting glucose in clinic today was 187  4: Lymphedema- - stage 2 - elevating his legs when sitting for long periods of time at home; works at [employer] so is standing for long periods of time while at work - sister is going to measure his legs and get appropriate sized compression socks so that he can wear them every morning with removal at bedtime - consider lymphapress compression boots if edema persists.   Patient did not bring his medications nor a list. Each medication was verbally reviewed with the patient and he was encouraged to bring the bottles to every visit to confirm accuracy of list.  Return in 1 month or sooner for any questions/problems before then

## 2019-07-16 ENCOUNTER — Ambulatory Visit: Payer: Medicaid Other | Attending: Family | Admitting: Family

## 2019-07-16 ENCOUNTER — Encounter: Payer: Self-pay | Admitting: Family

## 2019-07-16 ENCOUNTER — Other Ambulatory Visit: Payer: Self-pay

## 2019-07-16 VITALS — BP 138/76 | HR 66 | Resp 18 | Ht 70.0 in | Wt 366.5 lb

## 2019-07-16 DIAGNOSIS — I890 Lymphedema, not elsewhere classified: Secondary | ICD-10-CM | POA: Insufficient documentation

## 2019-07-16 DIAGNOSIS — Z79899 Other long term (current) drug therapy: Secondary | ICD-10-CM | POA: Insufficient documentation

## 2019-07-16 DIAGNOSIS — Z833 Family history of diabetes mellitus: Secondary | ICD-10-CM | POA: Insufficient documentation

## 2019-07-16 DIAGNOSIS — I5032 Chronic diastolic (congestive) heart failure: Secondary | ICD-10-CM | POA: Insufficient documentation

## 2019-07-16 DIAGNOSIS — Z6841 Body Mass Index (BMI) 40.0 and over, adult: Secondary | ICD-10-CM | POA: Insufficient documentation

## 2019-07-16 DIAGNOSIS — N182 Chronic kidney disease, stage 2 (mild): Secondary | ICD-10-CM

## 2019-07-16 DIAGNOSIS — Z823 Family history of stroke: Secondary | ICD-10-CM | POA: Insufficient documentation

## 2019-07-16 DIAGNOSIS — Z8673 Personal history of transient ischemic attack (TIA), and cerebral infarction without residual deficits: Secondary | ICD-10-CM | POA: Insufficient documentation

## 2019-07-16 DIAGNOSIS — Z8249 Family history of ischemic heart disease and other diseases of the circulatory system: Secondary | ICD-10-CM | POA: Insufficient documentation

## 2019-07-16 DIAGNOSIS — N183 Chronic kidney disease, stage 3 unspecified: Secondary | ICD-10-CM | POA: Insufficient documentation

## 2019-07-16 DIAGNOSIS — E1122 Type 2 diabetes mellitus with diabetic chronic kidney disease: Secondary | ICD-10-CM

## 2019-07-16 DIAGNOSIS — I10 Essential (primary) hypertension: Secondary | ICD-10-CM

## 2019-07-16 DIAGNOSIS — I130 Hypertensive heart and chronic kidney disease with heart failure and stage 1 through stage 4 chronic kidney disease, or unspecified chronic kidney disease: Secondary | ICD-10-CM | POA: Insufficient documentation

## 2019-07-16 DIAGNOSIS — Z7984 Long term (current) use of oral hypoglycemic drugs: Secondary | ICD-10-CM | POA: Insufficient documentation

## 2019-07-16 DIAGNOSIS — E785 Hyperlipidemia, unspecified: Secondary | ICD-10-CM | POA: Insufficient documentation

## 2019-07-16 DIAGNOSIS — Z7982 Long term (current) use of aspirin: Secondary | ICD-10-CM | POA: Insufficient documentation

## 2019-07-16 LAB — GLUCOSE, CAPILLARY: Glucose-Capillary: 187 mg/dL — ABNORMAL HIGH (ref 70–99)

## 2019-07-16 NOTE — Patient Instructions (Signed)
Continue weighing daily and call for an overnight weight gain of > 2 pounds or a weekly weight gain of >5 pounds. 

## 2019-07-20 NOTE — Progress Notes (Signed)
Cardiology Office Note    Date:  07/26/2019   ID:  Jeremiah Vance, DOB 01/26/1971, MRN [id number]  PCP:  Jeremiah Reamer, DO  Cardiologist:  Jeremiah Dell, MD  Electrophysiologist:  None   Chief Complaint: Follow-up  History of Present Illness:   Jeremiah Vance is a 49 y.o. male with history of cardiomyopathy with prior EF of 45% in 06/2017 subsequently improved to 65% in 08/2017, CVA in 08/2017, CKD stage III, DM2, HTN, and OSA nonadherent to CPAP who presents to establish care in the [place] office and for follow up of his cardiomyopathy.  Prior echo in 06/2017 showed an EF of 40-45%, diffuse HK worse in the inferior and inferoseptal walls, mildly dilated LV cavity, moderate LVH, trivial AI, mildly dilated left atrium, mildly dilated RV with mildly reduced RVSF, and a trivial pericardial effusion. No ischemic testing was undertaken. Follow up echo in 08/2017 showed improved LVSF with an EF of 60-65%, mild LVH, trivial MR, normal RVSF and cavity size.  More recently, he was admitted to [facility] in early 07/2019 with atypical chest pain, felt to possibly be GI in etiology with noted severely elevated BP, in the setting of being off his medications for several months. EKG was without acute ischemic changes. Echo showed a low normal LVSF with an EF of 50-55%, global HK, mild LVH, Gr2DD, mildly enlarged RV cavity with normal RVSF, and mildly dilated left atrium, no significant valvular abnormalities. CT chest showed no significant coronary artery calcification. He was seen in follow up in our [place] office on 07/12/2019 with BP 160/90 and compliance with medications. He had not had any further chest pain. His Coreg was titrated to 12.5 mg bid.   He comes in today accompanied by his sister.  He is doing well from a cardiac perspective.  He has not had any further chest pain.  He has chronic stable shortness of breath which is attributed to his obesity.  He reports a diet low in sodium.  He  reports compliance with his medications though has not yet taken any medicine this morning.  He is not checking his blood pressure at home.  No recent weights for review.  He has follow-up with a new PCP next week.  He does not have any issues or concerns to discuss at this time.   Labs independently reviewed: 07/2019 - TC 155, TG 106, HDL 32, LDL 102, A1c 9.4, Hgb 13.5, PLT 268, potassium 4.6, BUN 26, serum creatinine 1.5  Past Medical History:  Diagnosis Date  . CHF (congestive heart failure) ([facility])    "coinsided w/kidney problems I was having 06/2017"  . CKD (chronic kidney disease) stage 3, GFR 30-59 ml/min 06/2017  . High cholesterol   . History of cardiomyopathy    LVEF 40 to 45% in April 2019 - subsequently normalized  . Hypertension   . Ischemic stroke ([facility])    Small left internal capsule infarct due to lacunar disease  . Morbid obesity ([facility])   . Stroke ([facility]) 08/2017   "right sided weakness since; getting stronger though" (10/22/2017)  . Type 2 diabetes mellitus ([facility])     Past Surgical History:  Procedure Laterality Date  . ABDOMINAL HERNIA REPAIR  2008; 10/22/2017   "scope; OPEN REPAIR INCARCERATED VENTRAL HERNIA  . HERNIA REPAIR    . KNEE ARTHROSCOPY Right 1989  . VENTRAL HERNIA REPAIR N/A 10/22/2017   Procedure: OPEN REPAIR INCARCERATED VENTRAL HERNIA;  Surgeon: Glenna Fellows, MD;  Location: [facility];  Service: General;  Laterality: N/A;    Current Medications: Current Meds  Medication Sig  . acetaminophen (TYLENOL) 500 MG tablet Take 500 mg by mouth every 6 (six) hours as needed for mild pain or moderate pain.  [name] Kitchen aspirin 81 MG chewable tablet Chew 1 tablet (81 mg total) by mouth daily.  [name] Kitchen atorvastatin (LIPITOR) 40 MG tablet Take 1 tablet (40 mg total) by mouth daily at 6 PM.  . carvedilol (COREG) 12.5 MG tablet Take 1 tablet (12.5 mg total) by mouth 2 (two) times daily.  [name] Kitchen lisinopril (ZESTRIL) 20 MG tablet Take 1 tablet (20 mg total) by mouth daily.  [name] Kitchen spironolactone  (ALDACTONE) 25 MG tablet Take 1 tablet (25 mg total) by mouth daily.  [name] Kitchen torsemide (DEMADEX) 20 MG tablet Take 2 tablets (40 mg total) by mouth daily.    Allergies:   Patient has no known allergies.   Social History   Socioeconomic History  . Marital status: Legally Separated    Spouse name: Not on file  . Number of children: Not on file  . Years of education: Not on file  . Highest education level: Not on file  Occupational History  . Not on file  Tobacco Use  . Smoking status: Never Smoker  . Smokeless tobacco: Never Used  Substance and Sexual Activity  . Alcohol use: Not Currently    Comment: 10/22/2017 "sober since 2000"  . Drug use: Never  . Sexual activity: Not Currently  Other Topics Concern  . Not on file  Social History Narrative  . Not on file   Social Determinants of Health   Financial Resource Strain:   . Difficulty of Paying Living Expenses:   Food Insecurity:   . Worried About Programme researcher, broadcasting/film/video in the Last Year:   . Barista in the Last Year:   Transportation Needs:   . Freight forwarder (Medical):   [name] Kitchen Lack of Transportation (Non-Medical):   Physical Activity:   . Days of Exercise per Week:   . Minutes of Exercise per Session:   Stress:   . Feeling of Stress :   Social Connections:   . Frequency of Communication with Friends and Family:   . Frequency of Social Gatherings with Friends and Family:   . Attends Religious Services:   . Active Member of Clubs or Organizations:   . Attends Banker Meetings:   [name] Kitchen Marital Status:      Family History:  The patient's family history includes Diabetes in his father, maternal grandmother, mother, and sister; Heart disease in his father, maternal grandmother, and paternal grandfather; Stroke in his mother and paternal grandfather.  ROS:   Review of Systems  Constitutional: Negative for chills, diaphoresis, fever, malaise/fatigue and weight loss.  HENT: Negative for congestion.   Eyes:  Negative for discharge and redness.  Respiratory: Negative for cough, sputum production, shortness of breath and wheezing.   Cardiovascular: Negative for chest pain, palpitations, orthopnea, claudication, leg swelling and PND.  Gastrointestinal: Negative for abdominal pain, heartburn, nausea and vomiting.  Musculoskeletal: Negative for falls and myalgias.  Skin: Negative for rash.  Neurological: Negative for dizziness, tingling, tremors, sensory change, speech change, focal weakness, loss of consciousness and weakness.  Endo/Heme/Allergies: Does not bruise/bleed easily.  Psychiatric/Behavioral: Negative for substance abuse. The patient is not nervous/anxious.   All other systems reviewed and are negative.    EKGs/Labs/Other Studies Reviewed:    Studies reviewed were summarized above. The additional studies were reviewed  today: As above  EKG:  EKG is ordered today.  The EKG ordered today demonstrates NSR, 63 bpm, no acute ST-T changes  Recent Labs: 08/06/2018: ALT 19 07/08/2019: B Natriuretic Peptide 219.0; BUN 26; Creatinine, Ser 1.50; Hemoglobin 13.5; Platelets 268; Potassium 4.6; Sodium 136  Recent Lipid Panel    Component Value Date/Time   CHOL 155 07/09/2019 0429   CHOL 115 06/17/2018 1455   TRIG 106 07/09/2019 0429   HDL 32 (L) 07/09/2019 0429   HDL 41 06/17/2018 1455   CHOLHDL 4.8 07/09/2019 0429   VLDL 21 07/09/2019 0429   LDLCALC 102 (H) 07/09/2019 0429   LDLCALC 53 06/17/2018 1455    PHYSICAL EXAM:    VS:  BP (!) 154/86 (BP Location: Left Arm, Patient Position: Sitting, Cuff Size: Large)   Pulse 63   Ht 5\' 11"  (1.803 m)   Wt (!) 372 lb 8 oz (169 kg)   SpO2 97%   BMI 51.95 kg/m   BMI: Body mass index is 51.95 kg/m.  Physical Exam  Constitutional: He is oriented to person, place, and time. He appears well-developed and well-nourished.  HENT:  Head: Normocephalic and atraumatic.  Eyes: Right eye exhibits no discharge. Left eye exhibits no discharge.  Neck: No  JVD present.  Cardiovascular: Normal rate, regular rhythm, S1 normal, S2 normal and normal heart sounds. Exam reveals no distant heart sounds, no friction rub, no midsystolic click and no opening snap.  No murmur heard. Pulses:      Posterior tibial pulses are 2+ on the right side and 2+ on the left side.  Pulmonary/Chest: Effort normal and breath sounds normal. No respiratory distress. He has no decreased breath sounds. He has no wheezes. He has no rales. He exhibits no tenderness.  Abdominal: Soft. He exhibits no distension. There is no abdominal tenderness.  Musculoskeletal:        General: Edema present.     Cervical back: Normal range of motion.     Comments: Trace bilateral pretibial edema with chronic hyperpigmentation associated with venous stasis noted bilaterally  Neurological: He is alert and oriented to person, place, and time.  Skin: Skin is warm and dry. No cyanosis. Nails show no clubbing.  Psychiatric: He has a normal mood and affect. His speech is normal and behavior is normal. Judgment and thought content normal.    Wt Readings from Last 3 Encounters:  07/26/19 (!) 372 lb 8 oz (169 kg)  07/16/19 (!) 366 lb 8 oz (166.2 kg)  07/12/19 (!) 381 lb (172.8 kg)     ASSESSMENT & PLAN:   1. Atypical chest pain: No further symptoms.  CTA of the chest showed no significant coronary artery calcification.  Likely in the setting of hypertensive urgency with medication nonadherence.  No further cardiac testing planned at this time without recurrence of symptoms.  2. Chronic diastolic CHF with systolic dysfunction/NICM: Most recent echo with low normal EF.  Volume status is difficult to assess secondary to body habitus.  Likely secondary to hypertensive heart disease.  He remains on carvedilol, lisinopril, spironolactone, and torsemide.  Check BMP today.  Heart rate of 63 bpm precludes titration of carvedilol at this time.  CHF education discussed in detail.  3. Hypertensive heart  disease: Blood pressure elevated at 154/86 today.  He has not taken any medications prior to coming to his appointment.  Advised the patient to take his medications daily as prescribed and call 09/11/19 in 1 to 2 weeks with BP readings after being  obtained 1-1/2 to 2 hours after medications.  Further recommendations regarding titration of antihypertensive therapy pending these results.  For now, he will continue current doses of carvedilol, lisinopril, spironolactone, and torsemide, as long as follow-up labs allow for continuation of these medications.  Low-sodium diet recommended.  4. CKD stage III: Check BMP today.  Likely in setting of poorly controlled hypertension.  Follow-up with PCP as directed.  5. Morbid obesity with OSA: Weight loss is advised.  Not compliant with CPAP.  He will need a repeat sleep study moving forward.  6. History of CVA: No residual deficits.  Remains on aspirin and atorvastatin.  Follow-up with PCP as directed.  7. HLD: Most recent LDL of 102 as outlined above.  Goal LDL being less than 70 in the setting of prior CVA.  He has recently been restarted on atorvastatin.  Recommend follow-up fasting lipid panel and liver function in approximately 8 weeks.  Disposition: F/u with MD only in 1 month to establish care in [facility]   Medication Adjustments/Labs and Tests Ordered: Current medicines are reviewed at length with the patient today.  Concerns regarding medicines are outlined above. Medication changes, Labs and Tests ordered today are summarized above and listed in the Patient Instructions accessible in Encounters.   Signed, Christell Faith, PA-C 07/26/2019 11:45 AM     [address] [place], [place] [address] [phone]

## 2019-07-26 ENCOUNTER — Other Ambulatory Visit: Payer: Self-pay

## 2019-07-26 ENCOUNTER — Ambulatory Visit (INDEPENDENT_AMBULATORY_CARE_PROVIDER_SITE_OTHER): Payer: Self-pay | Admitting: Physician Assistant

## 2019-07-26 ENCOUNTER — Encounter: Payer: Self-pay | Admitting: Physician Assistant

## 2019-07-26 VITALS — BP 154/86 | HR 63 | Ht 71.0 in | Wt 372.5 lb

## 2019-07-26 DIAGNOSIS — E785 Hyperlipidemia, unspecified: Secondary | ICD-10-CM

## 2019-07-26 DIAGNOSIS — I5032 Chronic diastolic (congestive) heart failure: Secondary | ICD-10-CM

## 2019-07-26 DIAGNOSIS — I428 Other cardiomyopathies: Secondary | ICD-10-CM

## 2019-07-26 DIAGNOSIS — I110 Hypertensive heart disease with heart failure: Secondary | ICD-10-CM

## 2019-07-26 DIAGNOSIS — I519 Heart disease, unspecified: Secondary | ICD-10-CM

## 2019-07-26 DIAGNOSIS — R0789 Other chest pain: Secondary | ICD-10-CM

## 2019-07-26 DIAGNOSIS — Z8673 Personal history of transient ischemic attack (TIA), and cerebral infarction without residual deficits: Secondary | ICD-10-CM

## 2019-07-26 DIAGNOSIS — G4733 Obstructive sleep apnea (adult) (pediatric): Secondary | ICD-10-CM

## 2019-07-26 DIAGNOSIS — N183 Chronic kidney disease, stage 3 unspecified: Secondary | ICD-10-CM

## 2019-07-26 NOTE — Patient Instructions (Signed)
Medication Instructions:  Your physician recommends that you continue on your current medications as directed. Please refer to the Current Medication list given to you today.  *If you need a refill on your cardiac medications before your next appointment, please call your pharmacy*   Lab Work: Your physician recommends that you have lab work today(BMET)  If you have labs (blood work) drawn today and your tests are completely normal, you will receive your results only by: [name] Kitchen MyChart Message (if you have MyChart) OR . A paper copy in the mail If you have any lab test that is abnormal or we need to change your treatment, we will call you to review the results.   Testing/Procedures: None ordered    Follow-Up: At [facility], you and your health needs are our priority.  As part of our continuing mission to provide you with exceptional heart care, we have created designated Provider Care Teams.  These Care Teams include your primary Cardiologist (physician) and Advanced Practice Providers (APPs -  Physician Assistants and Nurse Practitioners) who all work together to provide you with the care you need, when you need it.  We recommend signing up for the patient portal called "MyChart".  Sign up information is provided on this After Visit Summary.  MyChart is used to connect with patients for Virtual Visits (Telemedicine).  Patients are able to view lab/test results, encounter notes, upcoming appointments, etc.  Non-urgent messages can be sent to your provider as well.   To learn more about what you can do with MyChart, go to [email or web address].    Your next appointment:   1 month(s)  The format for your next appointment:   In Person  Provider:  Please establish primary cardiologist at your next office visit.   Other Instructions Call the clinic in 1 week with BP readings.  How to use a home blood pressure monitor. . Be still. Don't smoke, drink caffeinated beverages or exercise  within 30 minutes before measuring your blood pressure. . Sit correctly. Sit with your back straight and supported (on a dining chair, rather than a sofa). Your feet should be flat on the floor and your legs should not be crossed. Your arm should be supported on a flat surface (such as a table) with the upper arm at heart level. Make sure the bottom of the cuff is placed directly above the bend of the elbow.  . Measure at the same time every day. It's important to take the readings at the same time each day, such as morning and evening. Take reading approximately 1 hour after BP medications.

## 2019-07-27 ENCOUNTER — Telehealth: Payer: Self-pay

## 2019-07-27 DIAGNOSIS — E875 Hyperkalemia: Secondary | ICD-10-CM

## 2019-07-27 LAB — BASIC METABOLIC PANEL
BUN/Creatinine Ratio: 18 (ref 9–20)
BUN: 26 mg/dL — ABNORMAL HIGH (ref 6–24)
CO2: 24 mmol/L (ref 20–29)
Calcium: 9.1 mg/dL (ref 8.7–10.2)
Chloride: 102 mmol/L (ref 96–106)
Creatinine, Ser: 1.41 mg/dL — ABNORMAL HIGH (ref 0.76–1.27)
GFR calc Af Amer: 67 mL/min/{1.73_m2} (ref >59)
GFR calc non Af Amer: 58 mL/min/{1.73_m2} — ABNORMAL LOW (ref >59)
Glucose: 185 mg/dL — ABNORMAL HIGH (ref 65–99)
Potassium: 5.9 mmol/L — ABNORMAL HIGH (ref 3.5–5.2)
Sodium: 137 mmol/L (ref 134–144)

## 2019-07-27 NOTE — Telephone Encounter (Signed)
Patient returning call will attempt to reach again at 1130 he is at work

## 2019-07-27 NOTE — Telephone Encounter (Signed)
-----   Message from Sondra Barges, PA-C sent at 07/27/2019  7:32 AM EDT ----- Random glucose is elevated with known diabetes.  Renal function remains elevated with known CKD and is stable.  Potassium is elevated.   Recommendations:-Stop spironolactone  -Stop lisinopril  -Avoid seasonings and salt substitutes that are high in potassium -Recheck stat potassium this morning, if this is continuing to trend upwards he will need to go to the ED

## 2019-07-27 NOTE — Telephone Encounter (Signed)
Attempted to call patient. [facility] 07/27/2019

## 2019-07-28 NOTE — Telephone Encounter (Signed)
I spoke with the patient regarding his recent BMP results. I have advised him of Ryan's recommendations to: 1) Stop spironolactone 2) Stop lisinopril 3) Avoid seasonings/ salt substitutes that are high in potassium (the patient confirms he is not using this) 4) recheck a stat potassium  The patient voices understanding to stop spironolactone & lisinopril. He is unable to come for a repeat BMP this afternoon due to no transportation.  I have asked him to come in the morning. He states he is unable to come for a repeat BMP until tomorrow afternoon. He gets off work at 3:15 pm and will come after that.   I have advised him to please come to the Medical Mall entrance at [facility], 1st desk on the right to check in as soon as possible.  He is aware we will call him after we receive his repeat results.   The patient voices understanding of the above recommendations and is agreeable.    A STAT BMP order has been placed for tomorrow.  I have removed lisinopril & spironolactone from the patient's medication list for now.

## 2019-07-29 ENCOUNTER — Telehealth: Payer: Self-pay

## 2019-07-29 ENCOUNTER — Other Ambulatory Visit
Admission: RE | Admit: 2019-07-29 | Discharge: 2019-07-29 | Disposition: A | Discharge status: Home / Self Care | Payer: Medicaid Other | Source: Ambulatory Visit | Attending: Physician Assistant | Admitting: Physician Assistant

## 2019-07-29 DIAGNOSIS — E875 Hyperkalemia: Secondary | ICD-10-CM

## 2019-07-29 LAB — BASIC METABOLIC PANEL
Anion gap: 7 (ref 5–15)
BUN: 37 mg/dL — ABNORMAL HIGH (ref 6–20)
CO2: 25 mmol/L (ref 22–32)
Calcium: 8.2 mg/dL — ABNORMAL LOW (ref 8.9–10.3)
Chloride: 102 mmol/L (ref 98–111)
Creatinine, Ser: 1.62 mg/dL — ABNORMAL HIGH (ref 0.61–1.24)
GFR calc Af Amer: 57 mL/min — ABNORMAL LOW (ref >60)
GFR calc non Af Amer: 49 mL/min — ABNORMAL LOW (ref >60)
Glucose, Bld: 272 mg/dL — ABNORMAL HIGH (ref 70–99)
Potassium: 4.3 mmol/L (ref 3.5–5.1)
Sodium: 134 mmol/L — ABNORMAL LOW (ref 135–145)

## 2019-07-29 MED ORDER — HYDRALAZINE HCL 25 MG PO TABS: 25.0000 mg | ORAL_TABLET | Freq: Three times a day (TID) | ORAL | 3 refills | Status: DC

## 2019-07-29 NOTE — Telephone Encounter (Signed)
-----   Message from Sondra Barges, PA-C sent at 07/29/2019  4:30 PM EDT ----- Potassium is improved off spironolactone and lisinopril. Renal function remains mildly elevated and consistent with his prior readings. He should remain off spironolactone and lisinopril for now. To compensate for this, please start hydralazine 25 mg tid. I am trying to avoid calcium channel blockers in an effort to minimize lower extremity swelling. Let us know if his BP is running > 140/90.

## 2019-07-29 NOTE — Telephone Encounter (Signed)
Pt did report to medical mall this morning. Preliminary result reviewed. K WNL. Will await result note from provider for further POC.

## 2019-07-29 NOTE — Telephone Encounter (Signed)
Call to patient to review labs.  °  °Pt verbalized understanding and has no further questions at this time.  °  °Advised pt to call for any further questions or concerns.  °rx updated.  ° °

## 2019-08-02 ENCOUNTER — Other Ambulatory Visit: Payer: Self-pay

## 2019-08-02 ENCOUNTER — Ambulatory Visit: Payer: Medicaid Other | Admitting: Nurse Practitioner

## 2019-08-02 ENCOUNTER — Ambulatory Visit: Payer: Medicaid Other

## 2019-08-02 ENCOUNTER — Encounter: Payer: Self-pay | Admitting: Nurse Practitioner

## 2019-08-02 ENCOUNTER — Ambulatory Visit: Payer: Medicaid Other | Admitting: Pharmacist

## 2019-08-02 VITALS — BP 140/86 | HR 65 | Temp 98.3°F | Ht 71.0 in | Wt 378.8 lb

## 2019-08-02 DIAGNOSIS — E1122 Type 2 diabetes mellitus with diabetic chronic kidney disease: Secondary | ICD-10-CM

## 2019-08-02 DIAGNOSIS — E785 Hyperlipidemia, unspecified: Secondary | ICD-10-CM

## 2019-08-02 DIAGNOSIS — E1165 Type 2 diabetes mellitus with hyperglycemia: Secondary | ICD-10-CM

## 2019-08-02 DIAGNOSIS — R600 Localized edema: Secondary | ICD-10-CM

## 2019-08-02 DIAGNOSIS — I639 Cerebral infarction, unspecified: Secondary | ICD-10-CM

## 2019-08-02 DIAGNOSIS — M7501 Adhesive capsulitis of right shoulder: Secondary | ICD-10-CM

## 2019-08-02 DIAGNOSIS — E1121 Type 2 diabetes mellitus with diabetic nephropathy: Secondary | ICD-10-CM

## 2019-08-02 DIAGNOSIS — I5032 Chronic diastolic (congestive) heart failure: Secondary | ICD-10-CM

## 2019-08-02 DIAGNOSIS — N1831 Chronic kidney disease, stage 3a: Secondary | ICD-10-CM

## 2019-08-02 DIAGNOSIS — Z8673 Personal history of transient ischemic attack (TIA), and cerebral infarction without residual deficits: Secondary | ICD-10-CM

## 2019-08-02 DIAGNOSIS — Z0000 Encounter for general adult medical examination without abnormal findings: Secondary | ICD-10-CM

## 2019-08-02 DIAGNOSIS — R601 Generalized edema: Secondary | ICD-10-CM

## 2019-08-02 MED ORDER — JARDIANCE 10 MG PO TABS: 10.0000 mg | ORAL_TABLET | Freq: Every morning | ORAL | 1 refills | Status: DC

## 2019-08-02 NOTE — Chronic Care Management (AMB) (Signed)
Chronic Care Management   Note  08/02/2019 Name: Jeremiah Vance MRN: [id number] DOB: 09/04/1970   Subjective:  Jeremiah Vance is a 49 y.o. year old male who is a primary care patient of [name], NP. The CCM team was consulted for assistance with chronic disease management and care coordination needs.    Contacted patient for urgent medication access referral.  Jeremiah Vance was given information about Chronic Care Management services today including:  1. CCM service includes personalized support from designated clinical staff supervised by his physician, including individualized plan of care and coordination with other care providers 2. 24/7 contact phone numbers for assistance for urgent and routine care needs.  Patient agreed to services and verbal consent obtained.   Review of patient status, including review of consultants reports, laboratory and other test data, was performed as part of comprehensive evaluation and provision of chronic care management services.   SDOH (Social Determinants of Health) assessments and interventions performed:  yes  Objective:  Lab Results  Component Value Date   CREATININE 1.62 (H) 07/29/2019   CREATININE 1.41 (H) 07/26/2019   CREATININE 1.50 (H) 07/08/2019    Lab Results  Component Value Date   HGBA1C 9.4 (H) 07/08/2019       Component Value Date/Time   CHOL 155 07/09/2019 0429   CHOL 115 06/17/2018 1455   TRIG 106 07/09/2019 0429   HDL 32 (L) 07/09/2019 0429   HDL 41 06/17/2018 1455   CHOLHDL 4.8 07/09/2019 0429   VLDL 21 07/09/2019 0429   LDLCALC 102 (H) 07/09/2019 0429   LDLCALC 53 06/17/2018 1455    Clinical ASCVD: Yes - prior CVA The ASCVD Risk score Mikey Bussing DC Jr., et al., 2013) failed to calculate for the following reasons:   The patient has a prior MI or stroke diagnosis    BP Readings from Last 3 Encounters:  08/02/19 140/86  07/26/19 (!) 154/86  07/16/19 138/76    No Known Allergies  Medications Reviewed  Today    Reviewed by Filbert Berthold, CMA (Certified Medical Assistant) on 08/02/19 at 20  Med List Status: <None>  Medication Order Taking? Sig Documenting Provider Last Dose Status Informant        Discontinued 08/02/19 1102 (Discontinued by provider)   aspirin 81 MG chewable tablet [id number] Yes Chew 1 tablet (81 mg total) by mouth daily. Guadalupe Dawn, MD Taking Active Self  atorvastatin (LIPITOR) 40 MG tablet [id number] Yes Take 1 tablet (40 mg total) by mouth daily at 6 PM. Danford, Suann Larry, MD Taking Active   carvedilol (COREG) 12.5 MG tablet [id number] Yes Take 1 tablet (12.5 mg total) by mouth 2 (two) times daily. Imogene Burn, PA-C Taking Active   hydrALAZINE (APRESOLINE) 25 MG tablet [id number] No Take 1 tablet (25 mg total) by mouth 3 (three) times daily.  Patient not taking: Reported on 08/02/2019   Rise Mu, PA-C Not Taking Active         Discontinued 08/02/19 1102 (Discontinued by provider)   torsemide (DEMADEX) 20 MG tablet [id number] Yes Take 2 tablets (40 mg total) by mouth daily. Danford, Suann Larry, MD Taking Active            Assessment:   Goals Addressed            This Visit's Progress     Patient Stated   . PharmD "I can't afford my medications" (pt-stated)       CARE PLAN ENTRY (see longtitudinal plan of  care for additional care plan information)  Current Barriers:  . Polypharmacy; complex patient with multiple comorbidities including DM, CHF, hx CVA, depression . Financial concerns: only has [employer], no prescription drug coverage. Notes he is using GoodRx cards at [employer]. 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  . Previously seen by [facility], established w/ our clinic today . Most recent eGFR: ~49 o CHF (last EF 50-55%); torsemide 40 mg daily, carvedilol 12.5 mg BID, hydralazine 25 mg TID (spironolactone and lisinopril recently d/c d/t hyperkalemia) o T2DM:  last A1c 9.4%. Reports hx metformin, but was d/c "because it made me sick". Notes he is not comfortable with injectable therapy at this time. o Hx ASCVD: ASA 81 mg daily, atorvastatin 40 mg daily (had only been back on for ~ 4 weeks when last lipid panel checked)  Pharmacist Clinical Goal(s):  [name] Kitchen Over the next 90 days, patient will work with PharmD and provider towards optimized medication management  Interventions: . Comprehensive medication review performed; medication list updated in electronic medical record . Inter-disciplinary care team collaboration (see longitudinal plan of care) . Discussed [facility]. Messaged pharmacist, Alm Bustard, asking to outreach patient to discuss eligibility. Will collaborate w/ PCP to send all prescriptions to [facility].  . Given HF, CKD, DM, appropriate to initiate SGLT2. Recommend starting Jardiance 10 mg daily and sending to [facility] so that assistance can be pursued.   Patient Self Care Activities:  . Patient will take medications as prescribed  Initial goal documentation        Plan: - Will plan to f/u with patient within 1 week to ensure he has connected w/ [place], PharmD, [place], CPP Clinical Pharmacist [place] [phone]
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
## 2019-08-02 NOTE — Patient Instructions (Addendum)
Was nice to meet you and [name] today.    Please get your eye exam.   I have placed referral to neurology for your history of strokes.  I have placed referral to vascular for your swelling of the lower extremities  I am going to send you to x-ray for your right shoulder complaints.   I have placed referral into chronic care management team to help [place] to help you with your multiple medical problems and get you started on diabetes medicine. We will collaborate together next steps for your diabetes.  You need glucometer and test strips I will order this today.  Please see me back in the office 08/09/2019.   Diabetes Basics  Diabetes (diabetes mellitus) is a long-term (chronic) disease. It occurs when the body does not properly use sugar (glucose) that is released from food after you eat. Diabetes may be caused by one or both of these problems:  Your pancreas does not make enough of a hormone called insulin.  Your body does not react in a normal way to insulin that it makes. Insulin lets sugars (glucose) go into cells in your body. This gives you energy. If you have diabetes, sugars cannot get into cells. This causes high blood sugar (hyperglycemia). Follow these instructions at home: How is diabetes treated? You may need to take insulin or other diabetes medicines daily to keep your blood sugar in balance. Take your diabetes medicines every day as told by your doctor. List your diabetes medicines here: Diabetes medicines  Name of medicine: ______________________________ ? Amount (dose): _______________ Time (a.m./p.m.): _______________ Notes: ___________________________________  Name of medicine: ______________________________ ? Amount (dose): _______________ Time (a.m./p.m.): _______________ Notes: ___________________________________  Name of medicine: ______________________________ ? Amount (dose): _______________ Time (a.m./p.m.): _______________ Notes:  ___________________________________ If you use insulin, you will learn how to give yourself insulin by injection. You may need to adjust the amount based on the food that you eat. List the types of insulin you use here: Insulin  Insulin type: ______________________________ ? Amount (dose): _______________ Time (a.m./p.m.): _______________ Notes: ___________________________________  Insulin type: ______________________________ ? Amount (dose): _______________ Time (a.m./p.m.): _______________ Notes: ___________________________________  Insulin type: ______________________________ ? Amount (dose): _______________ Time (a.m./p.m.): _______________ Notes: ___________________________________  Insulin type: ______________________________ ? Amount (dose): _______________ Time (a.m./p.m.): _______________ Notes: ___________________________________  Insulin type: ______________________________ ? Amount (dose): _______________ Time (a.m./p.m.): _______________ Notes: ___________________________________ How do I manage my blood sugar?  Check your blood sugar levels using a blood glucose monitor as directed by your doctor. Your doctor will set treatment goals for you. Generally, you should have these blood sugar levels:  Before meals (preprandial): 80-130 mg/dL (4.4-7.2 mmol/L).  After meals (postprandial): below 180 mg/dL (10 mmol/L).  A1c level: less than 7%. Write down the times that you will check your blood sugar levels: Blood sugar checks  Time: _______________ Notes: ___________________________________  Time: _______________ Notes: ___________________________________  Time: _______________ Notes: ___________________________________  Time: _______________ Notes: ___________________________________  Time: _______________ Notes: ___________________________________  Time: _______________ Notes: ___________________________________  What do I need to know about low blood sugar? Low  blood sugar is called hypoglycemia. This is when blood sugar is at or below 70 mg/dL (3.9 mmol/L). Symptoms may include:  Feeling: ? Hungry. ? Worried or nervous (anxious). ? Sweaty and clammy. ? Confused. ? Dizzy. ? Sleepy. ? Sick to your stomach (nauseous).  Having: ? A fast heartbeat. ? A headache. ? A change in your vision. ? Tingling or no feeling (numbness) around the mouth, lips, or tongue. ? Jerky  movements that you cannot control (seizure).  Having trouble with: ? Moving (coordination). ? Sleeping. ? Passing out (fainting). ? Getting upset easily (irritability). Treating low blood sugar To treat low blood sugar, eat or drink something sugary right away. If you can think clearly and swallow safely, follow the 15:15 rule:  Take 15 grams of a fast-acting carb (carbohydrate). Talk with your doctor about how much you should take.  Some fast-acting carbs are: ? Sugar tablets (glucose pills). Take 3-4 glucose pills. ? 6-8 pieces of hard candy. ? 4-6 oz (120-150 mL) of fruit juice. ? 4-6 oz (120-150 mL) of regular (not diet) soda. ? 1 Tbsp (15 mL) honey or sugar.  Check your blood sugar 15 minutes after you take the carb.  If your blood sugar is still at or below 70 mg/dL (3.9 mmol/L), take 15 grams of a carb again.  If your blood sugar does not go above 70 mg/dL (3.9 mmol/L) after 3 tries, get help right away.  After your blood sugar goes back to normal, eat a meal or a snack within 1 hour. Treating very low blood sugar If your blood sugar is at or below 54 mg/dL (3 mmol/L), you have very low blood sugar (severe hypoglycemia). This is an emergency. Do not wait to see if the symptoms will go away. Get medical help right away. Call your local emergency services (911 in the [place] Do not drive yourself to the hospital. Questions to ask your health care provider  Do I need to meet with a diabetes educator?  What equipment will I need to care for myself at home?  What  diabetes medicines do I need? When should I take them?  How often do I need to check my blood sugar?  What number can I call if I have questions?  When is my next doctor's visit?  Where can I find a support group for people with diabetes? Where to find more information  American Diabetes Association: www.diabetes.org  American Association of Diabetes Educators: www.diabeteseducator.org/patient-resources Contact a doctor if:  Your blood sugar is at or above 240 mg/dL (04.5 mmol/L) for 2 days in a row.  You have been sick or have had a fever for 2 days or more, and you are not getting better.  You have any of these problems for more than 6 hours: ? You cannot eat or drink. ? You feel sick to your stomach (nauseous). ? You throw up (vomit). ? You have watery poop (diarrhea). Get help right away if:  Your blood sugar is lower than 54 mg/dL (3 mmol/L).  You get confused.  You have trouble: ? Thinking clearly. ? Breathing. Summary  Diabetes (diabetes mellitus) is a long-term (chronic) disease. It occurs when the body does not properly use sugar (glucose) that is released from food after digestion.  Take insulin and diabetes medicines as told.  Check your blood sugar every day, as often as told.  Keep all follow-up visits as told by your doctor. This is important. This information is not intended to replace advice given to you by your health care provider. Make sure you discuss any questions you have with your health care provider. Document Revised: 12/09/2018 Document Reviewed: 06/20/2017 Elsevier Patient Education  2020 [employer].

## 2019-08-02 NOTE — Patient Instructions (Signed)
Visit Information  Goals Addressed            This Visit's Progress     Patient Stated   . PharmD "I can't afford my medications" (pt-stated)       CARE PLAN ENTRY (see longtitudinal plan of care for additional care plan information)  Current Barriers:  . Polypharmacy; complex patient with multiple comorbidities including DM, CHF, hx CVA, depression . Financial concerns: only has [employer], no prescription drug coverage. Notes he is using GoodRx cards at [employer]. 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  . Previously seen by [facility], established w/ our clinic today . Most recent eGFR: ~49 o CHF (last EF 50-55%); torsemide 40 mg daily, carvedilol 12.5 mg BID, hydralazine 25 mg TID (spironolactone and lisinopril recently d/c d/t hyperkalemia) o T2DM: last A1c 9.4%. Reports hx metformin, but was d/c "because it made me sick". Notes he is not comfortable with injectable therapy at this time. o Hx ASCVD: ASA 81 mg daily, atorvastatin 40 mg daily (had only been back on for ~ 4 weeks when last lipid panel checked)  Pharmacist Clinical Goal(s):  [name] Kitchen Over the next 90 days, patient will work with PharmD and provider towards optimized medication management  Interventions: . Comprehensive medication review performed; medication list updated in electronic medical record . Inter-disciplinary care team collaboration (see longitudinal plan of care) . Discussed [facility]. Messaged pharmacist, Alm Bustard, asking to outreach patient to discuss eligibility. Will collaborate w/ PCP to send all prescriptions to [facility].  . Given HF, CKD, DM, appropriate to initiate SGLT2. Recommend starting Jardiance 10 mg daily and sending to [facility] so that assistance can be pursued.   Patient Self Care Activities:  . Patient will take medications as prescribed  Initial goal  documentation        Patient verbalizes understanding of instructions provided today.   Plan: - Will plan to f/u with patient within 1 week to ensure he has connected w/ [place], PharmD, [place], [place] Pharmacist [place] [phone]

## 2019-08-03 ENCOUNTER — Telehealth: Payer: Self-pay | Admitting: Nurse Practitioner

## 2019-08-03 DIAGNOSIS — E1165 Type 2 diabetes mellitus with hyperglycemia: Secondary | ICD-10-CM

## 2019-08-03 MED ORDER — BLOOD GLUCOSE METER KIT: PACK | 0 refills | Status: DC

## 2019-08-03 NOTE — Telephone Encounter (Signed)
Can he get a nurse visit to teach him to do glucometer testing at my ov 5/10 visit?   I ordered his Accucheck test  kit and strips at [facility] because [facility]'t have those. I printed the order to fax. No option for e Rx.   Please call pt and ask him to pick that kit, strips, lancets up and bring in to next visit so we can teach him how to use it.   Did he get all of his meds filled at the [facility]?  Is he taking them all as directed? Ask him to bring in his med bottles - every pill that he takes- herbals, OTC-everything. Thanks!

## 2019-08-05 ENCOUNTER — Telehealth: Payer: Self-pay | Admitting: Pharmacy Technician

## 2019-08-05 NOTE — Telephone Encounter (Signed)
Provided patient with new patient packet to obtain ongoing [facility] services.  [facility] must receive requested financial documentation within 30 days in order to determine eligibility and provide additional medication assistance.  Jeremiah Vance J. Jeremiah Vance Care Manager [facility] 

## 2019-08-05 NOTE — Telephone Encounter (Signed)
Spoke with patient this morning and he has not been able to pick up his meds yet but will be today. I advised patient to make sure to bring in supplies with him so we can teach him how to use device. Also he needs a referral for Ortho and also he asked about having a referral for podiatry that was discussed at his visit.

## 2019-08-05 NOTE — Addendum Note (Signed)
Addended by: Amedeo Kinsman A on: 08/05/2019 10:53 AM   Modules accepted: Orders

## 2019-08-06 ENCOUNTER — Other Ambulatory Visit: Payer: Self-pay

## 2019-08-09 ENCOUNTER — Encounter: Payer: Self-pay | Admitting: Nurse Practitioner

## 2019-08-09 ENCOUNTER — Other Ambulatory Visit: Payer: Self-pay

## 2019-08-09 ENCOUNTER — Ambulatory Visit (INDEPENDENT_AMBULATORY_CARE_PROVIDER_SITE_OTHER): Payer: Medicaid Other | Admitting: Nurse Practitioner

## 2019-08-09 VITALS — BP 130/84 | HR 78 | Temp 98.6°F | Ht 71.0 in | Wt 372.4 lb

## 2019-08-09 DIAGNOSIS — E785 Hyperlipidemia, unspecified: Secondary | ICD-10-CM

## 2019-08-09 DIAGNOSIS — E1121 Type 2 diabetes mellitus with diabetic nephropathy: Secondary | ICD-10-CM

## 2019-08-09 DIAGNOSIS — E1122 Type 2 diabetes mellitus with diabetic chronic kidney disease: Secondary | ICD-10-CM

## 2019-08-09 DIAGNOSIS — G8929 Other chronic pain: Secondary | ICD-10-CM

## 2019-08-09 DIAGNOSIS — M25511 Pain in right shoulder: Secondary | ICD-10-CM

## 2019-08-09 DIAGNOSIS — N1831 Chronic kidney disease, stage 3a: Secondary | ICD-10-CM

## 2019-08-09 LAB — BASIC METABOLIC PANEL
BUN: 31 mg/dL — ABNORMAL HIGH (ref 6–23)
CO2: 33 mEq/L — ABNORMAL HIGH (ref 19–32)
Calcium: 8.9 mg/dL (ref 8.4–10.5)
Chloride: 101 mEq/L (ref 96–112)
Creatinine, Ser: 1.44 mg/dL (ref 0.40–1.50)
GFR: 52 mL/min — ABNORMAL LOW (ref >60.00)
Glucose, Bld: 198 mg/dL — ABNORMAL HIGH (ref 70–99)
Potassium: 4.6 mEq/L (ref 3.5–5.1)
Sodium: 139 mEq/L (ref 135–145)

## 2019-08-09 NOTE — Patient Instructions (Addendum)
Pick up the Jardiance  [facility]  Check your fasting blood sugar daily and 2 hour after noon or supper meal. Write them down and bring in to next office visit.   Follow the diet recommendations.   I placed a referral in for orthopedics for your shoulder  You have a referral in for Podiatry- call for an appointment.    Office visit in 2 weeks with blood sugars.

## 2019-08-09 NOTE — Progress Notes (Signed)
Established Patient Office Visit  Subjective:  Patient ID: Jeremiah Vance, male    DOB: [birth year]  Age: 49 y.o. MRN: [id number]  CC:  Chief Complaint  Patient presents with  . Follow-up    diabetes    HPI [name] presents for a follow-up of T2DM  uncontrolled.  The patient has not started Jardiance yet.  He is not taking any medication for his T2DM. He is checking his blood sugars and her fasting sugar is 186 today.  He says he feels well.  He is also followed by chronic care management.  Goal A1c<7.0  Lab Results  Component Value Date   HGBA1C 9.4 (H) 07/08/2019   Obesity: BMI 52.83.  He has cut back on his sweets, carbohydrates, following more of a low glycemic index diet.  We have given him dietary information in the past and will repeat again today.  Wt Readings from Last 3 Encounters:  08/09/19 (!) 372 lb 6.4 oz (168.9 kg)  08/02/19 (!) 378 lb 12.8 oz (171.8 kg)  07/26/19 (!) 372 lb 8 oz (169 kg)    Chronic right shoulder pain: He had 2 view right shoulder x-ray performed 08/02/2019 revealed AC joint degenerative change.  No fracture or malalignment.  Right lung apex clear.  He would like me for orthopedic referral.  HTN/CKD3: He remains on 12.5 mg carvedilol twice daily, 20 mg  torsemide twice daily, and hydralazine 25 mg 3 times daily.  Monitoring kidney function.  Avoiding calcium channel blocker secondary to lower extremity edema.  Blood pressure today 130/84 improving.  He feels well without chest pain, shortness of breath, DOE, dizziness, lightheadedness.  HLD: He is taking Lipitor 40 mg daily.  Goal LDL less than 70 with history of stroke. Lab Results  Component Value Date   CHOL 155 07/09/2019   HDL 32 (L) 07/09/2019   LDLCALC 102 (H) 07/09/2019   TRIG 106 07/09/2019   CHOLHDL 4.8 07/09/2019    Past Medical History:  Diagnosis Date  . Allergies   . Arthritis   . CHF (congestive heart failure) ([place])    "coinsided w/kidney problems I was having  06/2017"  . Chicken pox   . CKD (chronic kidney disease) stage 3, GFR 30-59 ml/min 06/2017  . Depression   . High cholesterol   . History of cardiomyopathy    LVEF 40 to 45% in April 2019 - subsequently normalized  . Hypertension   . Ischemic stroke ([place])    Small left internal capsule infarct due to lacunar disease  . Morbid obesity ([place])   . Stroke ([place]) 08/2017   "right sided weakness since; getting stronger though" (10/22/2017)  . Type 2 diabetes mellitus ([place])     Past Surgical History:  Procedure Laterality Date  . ABDOMINAL HERNIA REPAIR  2008; 10/22/2017   "scope; OPEN REPAIR INCARCERATED VENTRAL HERNIA  . HERNIA REPAIR    . KNEE ARTHROSCOPY Right 1989  . VENTRAL HERNIA REPAIR N/A 10/22/2017   Procedure: OPEN REPAIR INCARCERATED VENTRAL HERNIA;  Surgeon: Excell Seltzer, MD;  Location: [facility];  Service: General;  Laterality: N/A;    Family History  Problem Relation Age of Onset  . Diabetes Father   . Heart disease Father   . Arthritis Father   . Hearing loss Father   . Hyperlipidemia Father   . Heart attack Father   . Hypertension Father   . Stroke Father   . Diabetes Sister   . Diabetes Mother   . Stroke  Mother   . Arthritis Mother   . Depression Mother   . Heart disease Mother   . Hypertension Mother   . Learning disabilities Mother   . Mental illness Mother   . Diabetes Maternal Grandmother   . Heart disease Maternal Grandmother   . Depression Maternal Grandmother   . Hyperlipidemia Maternal Grandmother   . Hypertension Maternal Grandmother   . Stroke Maternal Grandmother   . Stroke Paternal Grandfather   . Heart disease Paternal Grandfather   . Arthritis Paternal Grandfather   . Heart attack Paternal Grandfather   . Hypertension Maternal Grandfather   . Hyperlipidemia Maternal Grandfather   . Stroke Maternal Grandfather   . Arthritis Paternal Grandmother   . Hearing loss Paternal Grandmother   . Depression Sister   . Diabetes Sister   .  Hypertension Sister   . Mental illness Sister     Social History   Socioeconomic History  . Marital status: Legally Separated    Spouse name: Not on file  . Number of children: Not on file  . Years of education: Not on file  . Highest education level: Not on file  Occupational History  . Not on file  Tobacco Use  . Smoking status: Never Smoker  . Smokeless tobacco: Never Used  Substance and Sexual Activity  . Alcohol use: Not Currently    Comment: 10/22/2017 "sober since 2000"  . Drug use: Never  . Sexual activity: Not Currently  Other Topics Concern  . Not on file  Social History Narrative   He lives with his sister , [name] and she is his MPOA after his stokes   Social Determinants of Radio broadcast assistant Strain:   . Difficulty of Paying Living Expenses:   Food Insecurity:   . Worried About Charity fundraiser in the Last Year:   . Arboriculturist in the Last Year:   Transportation Needs:   . Film/video editor (Medical):   [name] Kitchen Lack of Transportation (Non-Medical):   Physical Activity:   . Days of Exercise per Week:   . Minutes of Exercise per Session:   Stress:   . Feeling of Stress :   Social Connections:   . Frequency of Communication with Friends and Family:   . Frequency of Social Gatherings with Friends and Family:   . Attends Religious Services:   . Active Member of Clubs or Organizations:   . Attends Archivist Meetings:   [name] Kitchen Marital Status:   Intimate Partner Violence:   . Fear of Current or Ex-Partner:   . Emotionally Abused:   [name] Kitchen Physically Abused:   . Sexually Abused:     Outpatient Medications Prior to Visit  Medication Sig Dispense Refill  . aspirin 81 MG chewable tablet Chew 1 tablet (81 mg total) by mouth daily. 30 tablet 0  . atorvastatin (LIPITOR) 40 MG tablet Take 1 tablet (40 mg total) by mouth daily at 6 PM. 90 tablet 3  . blood glucose meter kit and supplies Dispense based on patient and insurance preference. Use  up to four times daily as directed. (FOR ICD-10 E10.9, E11.9). 1 each 0  . carvedilol (COREG) 12.5 MG tablet Take 1 tablet (12.5 mg total) by mouth 2 (two) times daily. 180 tablet 3  . empagliflozin (JARDIANCE) 10 MG TABS tablet Take 10 mg by mouth every morning. 90 tablet 1  . hydrALAZINE (APRESOLINE) 25 MG tablet Take 1 tablet (25 mg total) by mouth 3 (three)  times daily. 270 tablet 3  . torsemide (DEMADEX) 20 MG tablet Take 2 tablets (40 mg total) by mouth daily. 180 tablet 3   No facility-administered medications prior to visit.    No Known Allergies  Review of Systems  Constitutional: Negative for chills and fever.  HENT: Negative for congestion and sinus pressure.   Eyes: Negative.   Respiratory: Negative for cough, chest tightness and shortness of breath.   Cardiovascular: Positive for leg swelling. Negative for chest pain and palpitations.  Gastrointestinal: Negative for abdominal pain.  Genitourinary: Negative for difficulty urinating.  Musculoskeletal: Positive for arthralgias.  Neurological: Negative.   Psychiatric/Behavioral: Negative.       Objective:    Physical Exam  Constitutional: He is oriented to person, place, and time. He appears well-developed.  HENT:  Head: Normocephalic and atraumatic.  Cardiovascular: Normal rate and regular rhythm.  Pulmonary/Chest: Effort normal and breath sounds normal.  Musculoskeletal:     Cervical back: Normal range of motion and neck supple.  Neurological: He is alert and oriented to person, place, and time.  Skin: Skin is warm and dry.  Lower extremity remian with red venous stasis-chrinc changes, less edema.   Psychiatric: He has a normal mood and affect. His behavior is normal.  Vitals reviewed.   BP 130/84 (BP Location: Left Arm, Patient Position: Sitting, Cuff Size: Large)   Pulse 78   Temp 98.6 F (37 C) (Skin)   Ht '5\' 11"'  (1.803 m)   Wt (!) 372 lb 6.4 oz (168.9 kg)   SpO2 97%   BMI 51.94 kg/m  Wt Readings from  Last 3 Encounters:  08/09/19 (!) 372 lb 6.4 oz (168.9 kg)  08/02/19 (!) 378 lb 12.8 oz (171.8 kg)  07/26/19 (!) 372 lb 8 oz (169 kg)     Health Maintenance Due  Topic Date Due  . OPHTHALMOLOGY EXAM  Never done  . TETANUS/TDAP  Never done  . FOOT EXAM  11/19/2018    There are no preventive care reminders to display for this patient.  Lab Results  Component Value Date   TSH 1.090 06/17/2018   Lab Results  Component Value Date   WBC 10.9 (H) 07/08/2019   HGB 13.5 07/08/2019   HCT 42.7 07/08/2019   MCV 87.5 07/08/2019   PLT 268 07/08/2019   Lab Results  Component Value Date   NA 134 (L) 07/29/2019   K 4.3 07/29/2019   CO2 25 07/29/2019   GLUCOSE 272 (H) 07/29/2019   BUN 37 (H) 07/29/2019   CREATININE 1.62 (H) 07/29/2019   BILITOT 0.9 08/06/2018   ALKPHOS 99 08/06/2018   AST 19 08/06/2018   ALT 19 08/06/2018   PROT 7.0 08/06/2018   ALBUMIN 3.5 08/06/2018   CALCIUM 8.2 (L) 07/29/2019   ANIONGAP 7 07/29/2019   Lab Results  Component Value Date   CHOL 155 07/09/2019   Lab Results  Component Value Date   HDL 32 (L) 07/09/2019   Lab Results  Component Value Date   LDLCALC 102 (H) 07/09/2019   Lab Results  Component Value Date   TRIG 106 07/09/2019   Lab Results  Component Value Date   CHOLHDL 4.8 07/09/2019   Lab Results  Component Value Date   HGBA1C 9.4 (H) 07/08/2019      Assessment & Plan:   Problem List Items Addressed This Visit      Endocrine   Type II diabetes mellitus with renal manifestations ([place]) - Primary   Relevant Orders  Basic Metabolic Panel (BMET)    Other Visit Diagnoses    Chronic right shoulder pain       Relevant Orders   Ambulatory referral to Orthopedic Surgery     The patient is beginning care with [place], and has multiple chronic medical problems that are uncontrolled at this time.  He does not want to be overwhelmed.  We are focusing on getting him on corrective diabetes medication.  He was given directions on how to  find the [facility] pharmacy has we have ordered his Jardiance for him there at a much reduced cost.  He is checking his blood glucoses and had training once again with the staff and he seems to know what to do.  He is motivated to manage his diabetes.  He has been contacted by Podiatry and has the number to call back to set up an appointment.  He says he wants to do this.  Cardiology is working with him with hypertension, edema, CHF, and current medication regime seems to be working well.  He does have less edema today.  We will repeat met B.  Watching kidney status closely.   He does not have transportation at this time and comes to the doctors visits via taxi.  His sister has a new job and is unable to bring him.  Patient says he has not been able to drive since his stroke because of poor peripheral vision.  Mention on his CT angio of hepatic steatosis possible nodular liver.  We will try to add liver panel today, and follow-up with this next visits.  Again,  he does not want to be overwhelmed with too many diagnoses and too many test at this time.   It is important for him though to have his shoulder evaluated.  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  We reviewed the x-ray today.  He will avoid the use of NSAIDs. Occasional  Tylenol as needed.  He ws advised:  Pick up the Jardiance  [facility] Check your fasting blood sugar daily and 2 hour after noon or supper meal. Write them down and bring in to next office visit.  Follow the diet recommendations. Given handouts and reviewed.   I placed a referral in for orthopedics for your shoulder You have a referral in for Podiatry- call for an appointment.   No orders of the defined types were placed in this encounter.   Follow-up: Return in about 2 weeks (around 08/23/2019).   This visit occurred during the SARS-CoV-2 public health emergency.  Safety protocols were in place, including screening  questions prior to the visit, additional usage of staff PPE, and extensive cleaning of exam room while observing appropriate contact time as indicated for disinfecting solutions.   I provided  35 minutes of non-face-to-face time during this encounter reviewing patient's current problems and past surgeries, labs and imaging studies, providing counseling on the above mentioned problems , and coordination  of care .  Denice Paradise, NP

## 2019-08-10 ENCOUNTER — Other Ambulatory Visit: Payer: Self-pay | Admitting: Nurse Practitioner

## 2019-08-10 ENCOUNTER — Telehealth: Payer: Self-pay | Admitting: Nurse Practitioner

## 2019-08-10 ENCOUNTER — Encounter: Payer: Self-pay | Admitting: Nurse Practitioner

## 2019-08-10 ENCOUNTER — Other Ambulatory Visit (INDEPENDENT_AMBULATORY_CARE_PROVIDER_SITE_OTHER): Payer: Self-pay

## 2019-08-10 DIAGNOSIS — E785 Hyperlipidemia, unspecified: Secondary | ICD-10-CM

## 2019-08-10 LAB — HEPATIC FUNCTION PANEL
ALT: 14 U/L (ref 0–53)
AST: 12 U/L (ref 0–37)
Albumin: 3.7 g/dL (ref 3.5–5.2)
Alkaline Phosphatase: 87 U/L (ref 39–117)
Bilirubin, Direct: 0.1 mg/dL (ref 0.0–0.3)
Total Bilirubin: 0.5 mg/dL (ref 0.2–1.2)
Total Protein: 6.9 g/dL (ref 6.0–8.3)

## 2019-08-10 MED ORDER — JARDIANCE 10 MG PO TABS: 10.0000 mg | ORAL_TABLET | Freq: Every morning | ORAL | 1 refills | Status: DC

## 2019-08-10 NOTE — Telephone Encounter (Signed)
Pt would like for you to send prescription for Jardiance to [facility] since the [facility] doesn't have the prescription and dont know when they will get it in.

## 2019-08-10 NOTE — Telephone Encounter (Signed)
Sent faxed add on sheet to lab :)!

## 2019-08-10 NOTE — Addendum Note (Signed)
Addended by: Amedeo Kinsman A on: 08/10/2019 05:08 PM   Modules accepted: Orders

## 2019-08-12 NOTE — Telephone Encounter (Signed)
I spoke to his insurance provider yesterday and he does not have medication benefits. Therefore Im not sure which option would be best cost wise. Also medication management did not have his rx and did not know when they would get it back in stock is why he did not get it there. Please advise on a different medication.

## 2019-08-12 NOTE — Telephone Encounter (Signed)
As you called this.

## 2019-08-12 NOTE — Telephone Encounter (Signed)
Patient aware of below message from Catie and to pick up samples from me.

## 2019-08-12 NOTE — Telephone Encounter (Addendum)
[facility] will work with patient to complete Patient Assistance application for Jardiance. Patient needs to collaborate w/ the team at [facility] to return the enrollment paperwork, which they will submit to the drug company on his behalf. Once approved, they will ship the medication to [facility] for him to pick up. It will take a few weeks for the process, but the first step is him working with [facility]. We can sample in the meantime

## 2019-08-12 NOTE — Telephone Encounter (Signed)
Pt called in and said after speaking with pharmacy it is going to cost him $2000 to take Jardiance. He wants to know if there are alternate medication he can take instead of this that his insurance would pay for. He would like a call back.

## 2019-08-12 NOTE — Telephone Encounter (Addendum)
Samples of this drug were given to the patient, quantity 4 boxes, Lot Number [id number], [id number], [id number]  Medication Samples have been provided to the patient.  Drug name: Jardiance      Strength: 10mg         Qty: 7  LOT , : [id number], [id number] Exp.Date: 06/23  Dosing instructions:  Take 10 mg by mouth every morning.         The patient has been instructed regarding the correct time, dose, and frequency of taking this medication, including desired effects and most common side effects.   7/23 3:40 PM 08/18/2019

## 2019-08-17 NOTE — Telephone Encounter (Signed)
Spoke with patient to remind him I have the samples of his medication. Patient states he will try to pick them up tomorrow.

## 2019-08-18 NOTE — Telephone Encounter (Signed)
Patient picked up Jardiance samples

## 2019-08-27 ENCOUNTER — Encounter: Payer: Self-pay | Admitting: Family

## 2019-08-27 ENCOUNTER — Ambulatory Visit: Payer: Medicaid Other | Attending: Family | Admitting: Family

## 2019-08-27 ENCOUNTER — Other Ambulatory Visit: Payer: Self-pay

## 2019-08-27 ENCOUNTER — Ambulatory Visit (INDEPENDENT_AMBULATORY_CARE_PROVIDER_SITE_OTHER): Payer: Self-pay | Admitting: Cardiology

## 2019-08-27 ENCOUNTER — Encounter: Payer: Self-pay | Admitting: Cardiology

## 2019-08-27 VITALS — BP 138/70 | HR 60 | Resp 18 | Ht 70.0 in | Wt 378.2 lb

## 2019-08-27 VITALS — BP 122/70 | HR 59 | Ht 70.0 in | Wt 372.1 lb

## 2019-08-27 DIAGNOSIS — N189 Chronic kidney disease, unspecified: Secondary | ICD-10-CM | POA: Insufficient documentation

## 2019-08-27 DIAGNOSIS — Z7982 Long term (current) use of aspirin: Secondary | ICD-10-CM | POA: Insufficient documentation

## 2019-08-27 DIAGNOSIS — Z8349 Family history of other endocrine, nutritional and metabolic diseases: Secondary | ICD-10-CM | POA: Insufficient documentation

## 2019-08-27 DIAGNOSIS — I130 Hypertensive heart and chronic kidney disease with heart failure and stage 1 through stage 4 chronic kidney disease, or unspecified chronic kidney disease: Secondary | ICD-10-CM | POA: Insufficient documentation

## 2019-08-27 DIAGNOSIS — R05 Cough: Secondary | ICD-10-CM | POA: Insufficient documentation

## 2019-08-27 DIAGNOSIS — Z833 Family history of diabetes mellitus: Secondary | ICD-10-CM | POA: Insufficient documentation

## 2019-08-27 DIAGNOSIS — Z8673 Personal history of transient ischemic attack (TIA), and cerebral infarction without residual deficits: Secondary | ICD-10-CM | POA: Insufficient documentation

## 2019-08-27 DIAGNOSIS — I10 Essential (primary) hypertension: Secondary | ICD-10-CM

## 2019-08-27 DIAGNOSIS — I890 Lymphedema, not elsewhere classified: Secondary | ICD-10-CM | POA: Insufficient documentation

## 2019-08-27 DIAGNOSIS — I5030 Unspecified diastolic (congestive) heart failure: Secondary | ICD-10-CM

## 2019-08-27 DIAGNOSIS — Z79899 Other long term (current) drug therapy: Secondary | ICD-10-CM | POA: Insufficient documentation

## 2019-08-27 DIAGNOSIS — Z8249 Family history of ischemic heart disease and other diseases of the circulatory system: Secondary | ICD-10-CM | POA: Insufficient documentation

## 2019-08-27 DIAGNOSIS — N182 Chronic kidney disease, stage 2 (mild): Secondary | ICD-10-CM

## 2019-08-27 DIAGNOSIS — I5032 Chronic diastolic (congestive) heart failure: Secondary | ICD-10-CM | POA: Insufficient documentation

## 2019-08-27 DIAGNOSIS — E785 Hyperlipidemia, unspecified: Secondary | ICD-10-CM | POA: Insufficient documentation

## 2019-08-27 DIAGNOSIS — E1122 Type 2 diabetes mellitus with diabetic chronic kidney disease: Secondary | ICD-10-CM | POA: Insufficient documentation

## 2019-08-27 LAB — GLUCOSE, CAPILLARY: Glucose-Capillary: 195 mg/dL — ABNORMAL HIGH (ref 70–99)

## 2019-08-27 NOTE — Progress Notes (Signed)
Cardiology Office Note:    Date:  08/27/2019   ID:  Jeremiah Vance, DOB 08-28-1970, MRN [id number]  PCP:  [name], NP  Cardiologist:  Kate Sable, MD  Electrophysiologist:  None   Referring MD: Danna Hefty, DO   Chief Complaint  Patient presents with  . OTHER    1 month f/u no complaints today. Meds reviewed verbally with pt.    History of Present Illness:    Jeremiah Vance is a 49 y.o. male with a hx of cardiomyopathy, HfpEF last EF 50 to 55%, hypertension, history of CVA, CKD stage III who presents for follow-up.  Patient used to be seen at the [place] office but wants to establish care at [facility]  due to commuting distance.  He has a prior history of mildly reduced ejection fraction with EF of 40 to 45% in twenty nineteen, last echocardiogram showed low normal EF and grade 2 diastolic dysfunction.  Recently admitted to [facility] due to hypertensive urgency.  His diastolic dysfunction has been attributed to hypertensive heart disease.  His medications were titrated and optimized.  Low-sodium diet recommended.  Past Medical History:  Diagnosis Date  . Allergies   . Arthritis   . CHF (congestive heart failure) ([place])    "coinsided w/kidney problems I was having 06/2017"  . Chicken pox   . CKD (chronic kidney disease) stage 3, GFR 30-59 ml/min 06/2017  . Depression   . High cholesterol   . History of cardiomyopathy    LVEF 40 to 45% in April 2019 - subsequently normalized  . Hypertension   . Ischemic stroke ([place])    Small left internal capsule infarct due to lacunar disease  . Morbid obesity ([place])   . Stroke ([place]) 08/2017   "right sided weakness since; getting stronger though" (10/22/2017)  . Type 2 diabetes mellitus ([place])     Past Surgical History:  Procedure Laterality Date  . ABDOMINAL HERNIA REPAIR  2008; 10/22/2017   "scope; OPEN REPAIR INCARCERATED VENTRAL HERNIA  . HERNIA REPAIR    . KNEE ARTHROSCOPY Right 1989  . VENTRAL HERNIA REPAIR  N/A 10/22/2017   Procedure: OPEN REPAIR INCARCERATED VENTRAL HERNIA;  Surgeon: Excell Seltzer, MD;  Location: [place];  Service: General;  Laterality: N/A;    Current Medications: Current Meds  Medication Sig  . aspirin 81 MG chewable tablet Chew 1 tablet (81 mg total) by mouth daily.  . blood glucose meter kit and supplies Dispense based on patient and insurance preference. Use up to four times daily as directed. (FOR ICD-10 E10.9, E11.9).  . carvedilol (COREG) 12.5 MG tablet Take 1 tablet (12.5 mg total) by mouth 2 (two) times daily.  . empagliflozin (JARDIANCE) 10 MG TABS tablet Take 10 mg by mouth every morning.  . hydrALAZINE (APRESOLINE) 25 MG tablet Take 1 tablet (25 mg total) by mouth 3 (three) times daily.  [name] Kitchen spironolactone (ALDACTONE) 25 MG tablet Take 25 mg by mouth daily.  [name] Kitchen torsemide (DEMADEX) 20 MG tablet Take 2 tablets (40 mg total) by mouth daily.     Allergies:   Patient has no known allergies.   Social History   Socioeconomic History  . Marital status: Legally Separated    Spouse name: Not on file  . Number of children: Not on file  . Years of education: Not on file  . Highest education level: Not on file  Occupational History  . Not on file  Tobacco Use  . Smoking status: Never Smoker  .  Smokeless tobacco: Never Used  Substance and Sexual Activity  . Alcohol use: Not Currently    Comment: 10/22/2017 "sober since 2000"  . Drug use: Never  . Sexual activity: Not Currently  Other Topics Concern  . Not on file  Social History Narrative   He lives with his sister , [name] and she is his MPOA after his stokes   Social Determinants of Radio broadcast assistant Strain:   . Difficulty of Paying Living Expenses:   Food Insecurity:   . Worried About Charity fundraiser in the Last Year:   . Arboriculturist in the Last Year:   Transportation Needs:   . Film/video editor (Medical):   [name] Kitchen Lack of Transportation (Non-Medical):   Physical Activity:   .  Days of Exercise per Week:   . Minutes of Exercise per Session:   Stress:   . Feeling of Stress :   Social Connections:   . Frequency of Communication with Friends and Family:   . Frequency of Social Gatherings with Friends and Family:   . Attends Religious Services:   . Active Member of Clubs or Organizations:   . Attends Archivist Meetings:   [name] Kitchen Marital Status:      Family History: The patient's family history includes Arthritis in his father, mother, paternal grandfather, and paternal grandmother; Depression in his maternal grandmother, mother, and sister; Diabetes in his father, maternal grandmother, mother, sister, and sister; Hearing loss in his father and paternal grandmother; Heart attack in his father and paternal grandfather; Heart disease in his father, maternal grandmother, mother, and paternal grandfather; Hyperlipidemia in his father, maternal grandfather, and maternal grandmother; Hypertension in his father, maternal grandfather, maternal grandmother, mother, and sister; Learning disabilities in his mother; Mental illness in his mother and sister; Stroke in his father, maternal grandfather, maternal grandmother, mother, and paternal grandfather.  ROS:   Please see the history of present illness.     All other systems reviewed and are negative.  EKGs/Labs/Other Studies Reviewed:    The following studies were reviewed today:   EKG:  EKG is  ordered today.  The ekg ordered today demonstrates is bradycardia, heart rate 59.  Recent Labs: 07/08/2019: B Natriuretic Peptide 219.0; Hemoglobin 13.5; Platelets 268 08/09/2019: BUN 31; Creatinine, Ser 1.44; Potassium 4.6; Sodium 139 08/10/2019: ALT 14  Recent Lipid Panel    Component Value Date/Time   CHOL 155 07/09/2019 0429   CHOL 115 06/17/2018 1455   TRIG 106 07/09/2019 0429   HDL 32 (L) 07/09/2019 0429   HDL 41 06/17/2018 1455   CHOLHDL 4.8 07/09/2019 0429   VLDL 21 07/09/2019 0429   LDLCALC 102 (H) 07/09/2019  0429   LDLCALC 53 06/17/2018 1455    Physical Exam:    VS:  BP 122/70 (BP Location: Left Arm, Patient Position: Sitting, Cuff Size: Large)   Pulse (!) 59   Ht _0  (1.778 m)   Wt (!) 372 lb 2 oz (168.8 kg)   SpO2 98%   BMI 53.39 kg/m     Wt Readings from Last 3 Encounters:  08/27/19 (!) 372 lb 2 oz (168.8 kg)  08/27/19 (!) 378 lb 4 oz (171.6 kg)  08/09/19 (!) 372 lb 6.4 oz (168.9 kg)     GEN:  Well nourished, well developed in no acute distress HEENT: Normal NECK: No JVD; No carotid bruits LYMPHATICS: No lymphadenopathy CARDIAC: RRR, no murmurs, rubs, gallops RESPIRATORY:  Clear to auscultation without rales, wheezing  or rhonchi  ABDOMEN: Soft, non-tender, non-distended MUSCULOSKELETAL:  1+ edema; No deformity  SKIN: Warm and dry NEUROLOGIC:  Alert and oriented x 3 PSYCHIATRIC:  Normal affect   ASSESSMENT:    1. Heart failure with preserved ejection fraction, unspecified HF chronicity ([place])   2. Essential hypertension   3. Morbid obesity ([place])    PLAN:    In order of problems listed above:  1. Patient with history of heart failure preserved ejection fraction, last EF 50 to 26%, grade 2 diastolic dysfunction.  Etiology likely combination of hypertension and obesity.  1+ edema noted on lower extremity.  Continue torsemide as prescribed. 2. Patient with history of hypertension, blood pressure adequately controlled.  Continue Coreg, hydralazine, Aldactone. 3. Patient is morbidly obese.  Low calorie diet/weight loss advised.  Over 3 minutes spent counseling patient.  Follow-up in 6 months.   Medication Adjustments/Labs and Tests Ordered: Current medicines are reviewed at length with the patient today.  Concerns regarding medicines are outlined above.  Orders Placed This Encounter  Procedures  . EKG 12-Lead   No orders of the defined types were placed in this encounter.   Patient Instructions  Medication Instructions:  No changes  *If you need a refill on  your cardiac medications before your next appointment, please call your pharmacy*   Lab Work: None  If you have labs (blood work) drawn today and your tests are completely normal, you will receive your results only by: [name] Kitchen MyChart Message (if you have MyChart) OR . A paper copy in the mail If you have any lab test that is abnormal or we need to change your treatment, we will call you to review the results.   Testing/Procedures: None   Follow-Up: At [facility], you and your health needs are our priority.  As part of our continuing mission to provide you with exceptional heart care, we have created designated Provider Care Teams.  These Care Teams include your primary Cardiologist (physician) and Advanced Practice Providers (APPs -  Physician Assistants and Nurse Practitioners) who all work together to provide you with the care you need, when you need it.  Your next appointment:   6 month(s)  The format for your next appointment:   In Person  Provider:    You may see Dr. Garen Lah or one of the following Advanced Practice Providers on your designated Care Team:    Murray Hodgkins, NP  Christell Faith, PA-C  Marrianne Mood, PA-C      Signed, Kate Sable, MD  08/27/2019 12:36 PM    [place]

## 2019-08-27 NOTE — Progress Notes (Signed)
Patient ID: Jeremiah Vance, male    DOB: 1970-04-13, 49 y.o.   MRN: [id number]  HPI  Mr Heinecke is a 49 y/o male with a history of DM, hyperlipidemia, HTN, CKD, stroke, morbid obesity and chronic heart failure.   Echo report from 07/09/19 reviewed and showed an EF of 50-55% along with mild LVH.  Admitted 07/08/19 due to atypical chest pain. Cardiology consult obtained. Medications were restarted. Discharged the following day.   He presents today for a follow-up visit with a chief complaint of minimal fatigue upon moderate exertion. He describes this as chronic in nature having been present for several years. He has associated chronic cough along with this. He denies any difficulty sleeping, dizziness, abdominal distention, palpitations, pedal edema, chest pain, shortness of breath or weight gain.   Has gotten established with local PCP and has vein/vascular appointment next week.   Past Medical History:  Diagnosis Date  . Allergies   . Arthritis   . CHF (congestive heart failure) ([place])    "coinsided w/kidney problems I was having 06/2017"  . Chicken pox   . CKD (chronic kidney disease) stage 3, GFR 30-59 ml/min 06/2017  . Depression   . High cholesterol   . History of cardiomyopathy    LVEF 40 to 45% in April 2019 - subsequently normalized  . Hypertension   . Ischemic stroke ([place])    Small left internal capsule infarct due to lacunar disease  . Morbid obesity ([place])   . Stroke ([place]) 08/2017   "right sided weakness since; getting stronger though" (10/22/2017)  . Type 2 diabetes mellitus ([place])    Past Surgical History:  Procedure Laterality Date  . ABDOMINAL HERNIA REPAIR  2008; 10/22/2017   "scope; OPEN REPAIR INCARCERATED VENTRAL HERNIA  . HERNIA REPAIR    . KNEE ARTHROSCOPY Right 1989  . VENTRAL HERNIA REPAIR N/A 10/22/2017   Procedure: OPEN REPAIR INCARCERATED VENTRAL HERNIA;  Surgeon: Excell Seltzer, MD;  Location: [facility];  Service: General;  Laterality: N/A;   Family  History  Problem Relation Age of Onset  . Diabetes Father   . Heart disease Father   . Arthritis Father   . Hearing loss Father   . Hyperlipidemia Father   . Heart attack Father   . Hypertension Father   . Stroke Father   . Diabetes Sister   . Diabetes Mother   . Stroke Mother   . Arthritis Mother   . Depression Mother   . Heart disease Mother   . Hypertension Mother   . Learning disabilities Mother   . Mental illness Mother   . Diabetes Maternal Grandmother   . Heart disease Maternal Grandmother   . Depression Maternal Grandmother   . Hyperlipidemia Maternal Grandmother   . Hypertension Maternal Grandmother   . Stroke Maternal Grandmother   . Stroke Paternal Grandfather   . Heart disease Paternal Grandfather   . Arthritis Paternal Grandfather   . Heart attack Paternal Grandfather   . Hypertension Maternal Grandfather   . Hyperlipidemia Maternal Grandfather   . Stroke Maternal Grandfather   . Arthritis Paternal Grandmother   . Hearing loss Paternal Grandmother   . Depression Sister   . Diabetes Sister   . Hypertension Sister   . Mental illness Sister    Social History   Tobacco Use  . Smoking status: Never Smoker  . Smokeless tobacco: Never Used  Substance Use Topics  . Alcohol use: Not Currently    Comment: 10/22/2017 "sober since 2000"  No Known Allergies  Prior to Admission medications   Medication Sig Start Date End Date Taking? Authorizing Provider  aspirin 81 MG chewable tablet Chew 1 tablet (81 mg total) by mouth daily. 09/09/17  Yes Guadalupe Dawn, MD  atorvastatin (LIPITOR) 40 MG tablet Take 1 tablet (40 mg total) by mouth daily at 6 PM. 07/09/19  Yes Danford, Suann Larry, MD  blood glucose meter kit and supplies Dispense based on patient and insurance preference. Use up to four times daily as directed. (FOR ICD-10 E10.9, E11.9). 08/03/19  Yes [name], NP  carvedilol (COREG) 12.5 MG tablet Take 1 tablet (12.5 mg total) by mouth 2 (two) times  daily. 07/12/19 10/10/19 Yes Imogene Burn, PA-C  empagliflozin (JARDIANCE) 10 MG TABS tablet Take 10 mg by mouth every morning. 08/10/19  Yes [name], NP  hydrALAZINE (APRESOLINE) 25 MG tablet Take 1 tablet (25 mg total) by mouth 3 (three) times daily. 07/29/19 10/27/19 Yes Dunn, Areta Haber, PA-C  torsemide (DEMADEX) 20 MG tablet Take 2 tablets (40 mg total) by mouth daily. 07/09/19  Yes Danford, Suann Larry, MD  spironolactone (ALDACTONE) 25 MG tablet Take 25 mg by mouth daily.    [provider]    Review of Systems  Constitutional: Positive for fatigue (minimal). Negative for appetite change.  HENT: Negative for congestion, postnasal drip and sore throat.   Eyes: Negative.   Respiratory: Positive for cough (dry ). Negative for shortness of breath.   Cardiovascular: Negative for chest pain, palpitations and leg swelling.  Gastrointestinal: Negative for abdominal distention and abdominal pain.  Endocrine: Negative.   Genitourinary: Negative.   Musculoskeletal: Negative for back pain and neck pain.  Skin: Negative.   Allergic/Immunologic: Negative.   Neurological: Negative for dizziness and light-headedness.  Hematological: Negative for adenopathy. Does not bruise/bleed easily.  Psychiatric/Behavioral: Negative for dysphoric mood and sleep disturbance (sleeping on 2 pillows). The patient is not nervous/anxious.    Vitals:   08/27/19 1036 08/27/19 1049  BP: (!) 171/92 138/70  Pulse: 60   Resp: 18   SpO2: 95%   Weight: (!) 378 lb 4 oz (171.6 kg)   Height: _0  (1.778 m)    Wt Readings from Last 3 Encounters:  08/27/19 (!) 372 lb 2 oz (168.8 kg)  08/27/19 (!) 378 lb 4 oz (171.6 kg)  08/09/19 (!) 372 lb 6.4 oz (168.9 kg)   Lab Results  Component Value Date   CREATININE 1.44 08/09/2019   CREATININE 1.62 (H) 07/29/2019   CREATININE 1.41 (H) 07/26/2019   Physical Exam Vitals and nursing note reviewed.  Constitutional:      Appearance: Normal appearance.  HENT:      Head: Normocephalic and atraumatic.  Cardiovascular:     Rate and Rhythm: Normal rate and regular rhythm.  Pulmonary:     Effort: Pulmonary effort is normal. No respiratory distress.     Breath sounds: No wheezing or rales.  Abdominal:     General: There is no distension.     Palpations: Abdomen is soft.  Musculoskeletal:        General: No tenderness.     Cervical back: Normal range of motion and neck supple.     Right lower leg: Edema (1+ pitting) present.     Left lower leg: Edema (1+ pitting) present.  Skin:    General: Skin is warm and dry.  Neurological:     General: No focal deficit present.     Mental Status: He is  alert and oriented to person, place, and time.  Psychiatric:        Mood and Affect: Mood normal.        Behavior: Behavior normal.        Thought Content: Thought content normal.    Assessment & Plan:  1: Chronic heart failure with preserved ejection fraction with structural changes- - NYHA class II - euvolemic today - weighing daily; reminded to call for an overnight weight gain of >2 pounds or a weekly weight gain of >5 pounds - weight up 12 pounds from last visit here 5 weeks ago although patient says that his home weight is "stable" - not adding salt to his food and is trying to read labels for sodium content; reviewed the importance of closely following a 2068m sodium diet - saw cardiology (Dunn) 07/26/19 and returns later today - could consider changing lisinopril to entresto since he has LVH; although is unsure he's still taking lisinopril (advised him to review the medications list we print today and call back with discrepancies) - BNP 07/08/19 was 219.0  2: HTN- - BP initially elevated (171/92) but had improved upon recheck with manual cuff after sitting for a few minutes (138/70) - saw PCP (Jerelene Redden 08/09/19 - BMP 08/09/19 reviewed and showed sodium 139, potassium 4.6, creatinine 1.44 and GFR 52  3: DM- - A1c 07/08/19 was 9.4% - nonfasting  glucose in clinic today was 195  4: Lymphedema- - stage 2 - elevating his legs when sitting for long periods of time at home; works at [employer] is standing for long periods of time while at work - has vein/ vascular appointment next week and they are going to wait until that appointment before ordering compression socks - consider lymphapress compression boots if edema persists.   Patient did not bring his medications nor a list. Each medication was verbally reviewed with the patient and he was encouraged to bring the bottles to every visit to confirm accuracy of list.  Return in 3 months or sooner for any questions/problems before then.

## 2019-08-27 NOTE — Patient Instructions (Signed)
Medication Instructions:  No changes  *If you need a refill on your cardiac medications before your next appointment, please call your pharmacy*   Lab Work: None  If you have labs (blood work) drawn today and your tests are completely normal, you will receive your results only by: . MyChart Message (if you have MyChart) OR . A paper copy in the mail If you have any lab test that is abnormal or we need to change your treatment, we will call you to review the results.   Testing/Procedures: None   Follow-Up: At [facility], you and your health needs are our priority.  As part of our continuing mission to provide you with exceptional heart care, we have created designated Provider Care Teams.  These Care Teams include your primary Cardiologist (physician) and Advanced Practice Providers (APPs -  Physician Assistants and Nurse Practitioners) who all work together to provide you with the care you need, when you need it.   Your next appointment:   6 month(s)  The format for your next appointment:   In Person  Provider:    You may see Dr. Agbor-Etang or one of the following Advanced Practice Providers on your designated Care Team:    Christopher Berge, NP  Ryan Dunn, PA-C  Jacquelyn Visser, PA-C  

## 2019-08-27 NOTE — Patient Instructions (Signed)
Continue weighing daily and call for an overnight weight gain of > 2 pounds or a weekly weight gain of >5 pounds. 

## 2019-08-31 ENCOUNTER — Encounter (INDEPENDENT_AMBULATORY_CARE_PROVIDER_SITE_OTHER): Payer: Self-pay | Admitting: Vascular Surgery

## 2019-08-31 ENCOUNTER — Ambulatory Visit (INDEPENDENT_AMBULATORY_CARE_PROVIDER_SITE_OTHER): Payer: Self-pay | Admitting: Vascular Surgery

## 2019-08-31 ENCOUNTER — Other Ambulatory Visit: Payer: Self-pay

## 2019-08-31 VITALS — BP 138/79 | HR 59 | Ht 70.0 in | Wt 375.0 lb

## 2019-08-31 DIAGNOSIS — N183 Chronic kidney disease, stage 3 unspecified: Secondary | ICD-10-CM | POA: Insufficient documentation

## 2019-08-31 DIAGNOSIS — I10 Essential (primary) hypertension: Secondary | ICD-10-CM

## 2019-08-31 DIAGNOSIS — E1121 Type 2 diabetes mellitus with diabetic nephropathy: Secondary | ICD-10-CM

## 2019-08-31 DIAGNOSIS — M7989 Other specified soft tissue disorders: Secondary | ICD-10-CM | POA: Insufficient documentation

## 2019-08-31 DIAGNOSIS — N1831 Chronic kidney disease, stage 3a: Secondary | ICD-10-CM

## 2019-08-31 DIAGNOSIS — E785 Hyperlipidemia, unspecified: Secondary | ICD-10-CM

## 2019-08-31 NOTE — Patient Instructions (Signed)

## 2019-08-31 NOTE — Assessment & Plan Note (Signed)
blood pressure control important in reducing the progression of atherosclerotic disease. On appropriate oral medications.  

## 2019-08-31 NOTE — Assessment & Plan Note (Signed)
lipid control important in reducing the progression of atherosclerotic disease. Continue statin therapy  

## 2019-08-31 NOTE — Progress Notes (Signed)
Patient ID: Jeremiah Vance, male   DOB: April 24, 1970, 49 y.o.   MRN: [id number]  Chief Complaint  Patient presents with  . New Patient (Initial Visit)    Gross edema with erythema stasis all chronic    HPI Jeremiah Vance is a 49 y.o. male.  I am asked to see the patient by K. Jerelene Redden for evaluation of marked leg swelling and stasis dermatitis to both legs.  He has had multiple episodes of blistering of his legs with weeping of fluid although it is not left large open wound or chronic ulcer that is nonhealing.  Both legs are affected.  Both legs are quite swollen even early this morning.  He denies any trauma or injury.  No fevers or chills.  He has a Band-Aid on the left shin where a blister popped, but the skin has now healed on the Band-Aid was removed today.  No previous history of DVT or superficial thrombophlebitis to his knowledge.  Multiple medical issues including chronic kidney disease and CHF history and he is on chronic diuretic therapy.     Past Medical History:  Diagnosis Date  . Allergies   . Arthritis   . CHF (congestive heart failure) ([place])    "coinsided w/kidney problems I was having 06/2017"  . Chicken pox   . CKD (chronic kidney disease) stage 3, GFR 30-59 ml/min 06/2017  . Depression   . High cholesterol   . History of cardiomyopathy    LVEF 40 to 45% in April 2019 - subsequently normalized  . Hypertension   . Ischemic stroke ([place])    Small left internal capsule infarct due to lacunar disease  . Morbid obesity ([place])   . Stroke ([place]) 08/2017   "right sided weakness since; getting stronger though" (10/22/2017)  . Type 2 diabetes mellitus ([place])     Past Surgical History:  Procedure Laterality Date  . ABDOMINAL HERNIA REPAIR  2008; 10/22/2017   "scope; OPEN REPAIR INCARCERATED VENTRAL HERNIA  . HERNIA REPAIR    . KNEE ARTHROSCOPY Right 1989  . VENTRAL HERNIA REPAIR N/A 10/22/2017   Procedure: OPEN REPAIR INCARCERATED VENTRAL HERNIA;  Surgeon: Excell Seltzer,  MD;  Location: [facility];  Service: General;  Laterality: N/A;     Family History  Problem Relation Age of Onset  . Diabetes Father   . Heart disease Father   . Arthritis Father   . Hearing loss Father   . Hyperlipidemia Father   . Heart attack Father   . Hypertension Father   . Stroke Father   . Diabetes Sister   . Diabetes Mother   . Stroke Mother   . Arthritis Mother   . Depression Mother   . Heart disease Mother   . Hypertension Mother   . Learning disabilities Mother   . Mental illness Mother   . Diabetes Maternal Grandmother   . Heart disease Maternal Grandmother   . Depression Maternal Grandmother   . Hyperlipidemia Maternal Grandmother   . Hypertension Maternal Grandmother   . Stroke Maternal Grandmother   . Stroke Paternal Grandfather   . Heart disease Paternal Grandfather   . Arthritis Paternal Grandfather   . Heart attack Paternal Grandfather   . Hypertension Maternal Grandfather   . Hyperlipidemia Maternal Grandfather   . Stroke Maternal Grandfather   . Arthritis Paternal Grandmother   . Hearing loss Paternal Grandmother   . Depression Sister   . Diabetes Sister   . Hypertension Sister   . Mental  illness Sister       Social History   Tobacco Use  . Smoking status: Never Smoker  . Smokeless tobacco: Never Used  Substance Use Topics  . Alcohol use: Not Currently    Comment: 10/22/2017 "sober since 2000"  . Drug use: Never     No Known Allergies  Current Outpatient Medications  Medication Sig Dispense Refill  . aspirin 81 MG chewable tablet Chew 1 tablet (81 mg total) by mouth daily. 30 tablet 0  . atorvastatin (LIPITOR) 40 MG tablet Take 1 tablet (40 mg total) by mouth daily at 6 PM. 90 tablet 3  . blood glucose meter kit and supplies Dispense based on patient and insurance preference. Use up to four times daily as directed. (FOR ICD-10 E10.9, E11.9). 1 each 0  . carvedilol (COREG) 12.5 MG tablet Take 1 tablet (12.5 mg total) by mouth 2 (two)  times daily. 180 tablet 3  . empagliflozin (JARDIANCE) 10 MG TABS tablet Take 10 mg by mouth every morning. 90 tablet 1  . hydrALAZINE (APRESOLINE) 25 MG tablet Take 1 tablet (25 mg total) by mouth 3 (three) times daily. 270 tablet 3  . torsemide (DEMADEX) 20 MG tablet Take 2 tablets (40 mg total) by mouth daily. 180 tablet 3  . spironolactone (ALDACTONE) 25 MG tablet Take 25 mg by mouth daily.     No current facility-administered medications for this visit.      REVIEW OF SYSTEMS (Negative unless checked)  Constitutional: []Weight loss  []Fever  []Chills Cardiac: []Chest pain   []Chest pressure   []Palpitations   []Shortness of breath when laying flat   []Shortness of breath at rest   [x]Shortness of breath with exertion. Vascular:  [x]Pain in legs with walking   [x]Pain in legs at rest   []Pain in legs when laying flat   []Claudication   []Pain in feet when walking  []Pain in feet at rest  []Pain in feet when laying flat   []History of DVT   []Phlebitis   [x]Swelling in legs   []Varicose veins   []Non-healing ulcers Pulmonary:   []Uses home oxygen   []Productive cough   []Hemoptysis   []Wheeze  []COPD   []Asthma Neurologic:  []Dizziness  []Blackouts   []Seizures   [x]History of stroke   []History of TIA  []Aphasia   []Temporary blindness   []Dysphagia   []Weakness or numbness in arms   []Weakness or numbness in legs Musculoskeletal:  [x]Arthritis   []Joint swelling   []Joint pain   []Low back pain Hematologic:  []Easy bruising  []Easy bleeding   []Hypercoagulable state   []Anemic  []Hepatitis Gastrointestinal:  []Blood in stool   []Vomiting blood  [x]Gastroesophageal reflux/heartburn   []Abdominal pain Genitourinary:  [x]Chronic kidney disease   []Difficult urination  []Frequent urination  []Burning with urination   []Hematuria Skin:  []Rashes   []Ulcers   []Wounds Psychological:  []History of anxiety   [] History of major depression.    Physical Exam BP 138/79   Pulse (!) 59   Ht 5'  10" (1.778 m)   Wt (!) 375 lb (170.1 kg)   BMI 53.81 kg/m  Gen:  WD/WN, NAD.  Morbidly obese Head: /AT, No temporalis wasting.  Ear/Nose/Throat: Hearing grossly intact, nares w/o erythema or drainage, oropharynx w/o Erythema/Exudate Eyes: Conjunctiva clear, sclera non-icteric  Neck: trachea midline.  No JVD.  Pulmonary:  Good air movement, respirations not labored, no use of accessory muscles  Cardiac: RRR, no JVD Vascular:  Vessel Right Left  Radial Palpable Palpable                          DP  trace  trace  PT  not palpable  not palpable   Gastrointestinal:. No masses, surgical incisions, or scars. Musculoskeletal: M/S 5/5 throughout.  Extremities without ischemic changes.  No deformity or atrophy.  2-3+ bilateral lower extremity edema.  Right leg slightly more swollen than the left.  Moderate stasis dermatitis changes present bilaterally. Neurologic: Sensation grossly intact in extremities.  Symmetrical.  Speech is fluent. Motor exam as listed above. Psychiatric: Judgment intact, Mood & affect appropriate for pt's clinical situation. Dermatologic: No rashes or ulcers noted.  No cellulitis or open wounds.    Radiology DG Shoulder Right  Result Date: 08/02/2019 CLINICAL DATA:  Chronic frozen shoulder EXAM: RIGHT SHOULDER - 2+ VIEW COMPARISON:  None. FINDINGS: AC joint degenerative change. No fracture or malalignment. Right lung apex is clear. IMPRESSION: AC joint degenerative change Electronically Signed   By: Donavan Foil M.D.   On: 08/02/2019 18:04    Labs Recent Results (from the past 2160 hour(s))  Basic metabolic panel     Status: Abnormal   Collection Time: 07/08/19  9:38 AM  Result Value Ref Range   Sodium 136 135 - 145 mmol/L   Potassium 4.6 3.5 - 5.1 mmol/L   Chloride 101 98 - 111 mmol/L   CO2 28 22 - 32 mmol/L   Glucose, Bld 307 (H) 70 - 99 mg/dL    Comment: Glucose reference range applies only to samples taken after fasting for at least 8 hours.   BUN 26  (H) 6 - 20 mg/dL   Creatinine, Ser 1.50 (H) 0.61 - 1.24 mg/dL   Calcium 8.4 (L) 8.9 - 10.3 mg/dL   GFR calc non Af Amer 54 (L) >60 mL/min   GFR calc Af Amer >60 >60 mL/min   Anion gap 7 5 - 15    Comment: Performed at [facility], [address] [place], [place] [address]  CBC     Status: Abnormal   Collection Time: 07/08/19  9:38 AM  Result Value Ref Range   WBC 10.9 (H) 4.0 - 10.5 K/uL   RBC 4.88 4.22 - 5.81 MIL/uL   Hemoglobin 13.5 13.0 - 17.0 g/dL   HCT 42.7 39.0 - 52.0 %   MCV 87.5 80.0 - 100.0 fL   MCH 27.7 26.0 - 34.0 pg   MCHC 31.6 30.0 - 36.0 g/dL   RDW 13.2 11.5 - 15.5 %   Platelets 268 150 - 400 K/uL   nRBC 0.0 0.0 - 0.2 %    Comment: Performed at [facility], [place], [place] [address]  Troponin I (High Sensitivity)     Status: Abnormal   Collection Time: 07/08/19  9:38 AM  Result Value Ref Range   Troponin I (High Sensitivity) 35 (H) <18 ng/L    Comment: (NOTE) Elevated high sensitivity troponin I (hsTnI) values and significant  changes across serial measurements may suggest ACS but many other  chronic and acute conditions are known to elevate hsTnI results.  Refer to the "Links" section for chest pain algorithms and additional  guidance. Performed at [facility], [address], [place], [place] [address]   Brain natriuretic peptide     Status: Abnormal   Collection Time: 07/08/19  9:38 AM  Result Value Ref Range   B Natriuretic Peptide  219.0 (H) 0.0 - 100.0 pg/mL    Comment: Performed at [facility], [place], [place] [address]  Troponin I (High Sensitivity)     Status: Abnormal   Collection Time: 07/08/19 12:57 PM  Result Value Ref Range   Troponin I (High Sensitivity) 31 (H) <18 ng/L    Comment: (NOTE) Elevated high sensitivity troponin I (hsTnI) values and significant  changes across serial measurements may suggest ACS but many other  chronic and acute conditions are known to  elevate hsTnI results.  Refer to the "Links" section for chest pain algorithms and additional  guidance. Performed at [facility], [place], [place] [address]   SARS CORONAVIRUS 2 (TAT 6-24 HRS) Nasopharyngeal Nasopharyngeal Swab     Status: None   Collection Time: 07/08/19  1:22 PM   Specimen: Nasopharyngeal Swab  Result Value Ref Range   SARS Coronavirus 2 NEGATIVE NEGATIVE    Comment: (NOTE) SARS-CoV-2 target nucleic acids are NOT DETECTED. The SARS-CoV-2 RNA is generally detectable in upper and lower respiratory specimens during the acute phase of infection. Negative results do not preclude SARS-CoV-2 infection, do not rule out co-infections with other pathogens, and should not be used as the sole basis for treatment or other patient management decisions. Negative results must be combined with clinical observations, patient history, and epidemiological information. The expected result is Negative. Fact Sheet for Patients: SugarRoll.be Fact Sheet for Healthcare Providers: https://www.woods-mathews.com/ This test is not yet approved or cleared by the [place] FDA and  has been authorized for detection and/or diagnosis of SARS-CoV-2 by FDA under an Emergency Use Authorization (EUA). This EUA Jeremiah remain  in effect (meaning this test can be used) for the duration of the COVID-19 declaration under Section 56 4(b)(1) of the Act, 21 U.S.C. section 360bbb-3(b)(1), unless the authorization is terminated or revoked sooner. Performed at [facility], [place] [address] [place], [place] [address]   Hemoglobin A1c     Status: Abnormal   Collection Time: 07/08/19  1:32 PM  Result Value Ref Range   Hgb A1c MFr Bld 9.4 (H) 4.8 - 5.6 %    Comment: (NOTE)         Prediabetes: 5.7 - 6.4         Diabetes: >6.4         Glycemic control for adults with diabetes: <7.0    Mean Plasma Glucose 223 mg/dL    Comment:  (NOTE) Performed At: [facility] [place], [place] [id number] Rush Farmer MD [id number]   Troponin I (High Sensitivity)     Status: Abnormal   Collection Time: 07/08/19  3:17 PM  Result Value Ref Range   Troponin I (High Sensitivity) 32 (H) <18 ng/L    Comment: (NOTE) Elevated high sensitivity troponin I (hsTnI) values and significant  changes across serial measurements may suggest ACS but many other  chronic and acute conditions are known to elevate hsTnI results.  Refer to the "Links" section for chest pain algorithms and additional  guidance. Performed at [facility], [address] [place], [place] [address]   HIV Antibody (routine testing w rflx)     Status: None   Collection Time: 07/08/19  3:17 PM  Result Value Ref Range   HIV Screen 4th Generation wRfx NON REACTIVE NON REACTIVE    Comment: Performed at [facility] [address] [place], [place] [address]  Glucose, capillary     Status:  Abnormal   Collection Time: 07/08/19  5:07 PM  Result Value Ref Range   Glucose-Capillary 147 (H) 70 - 99 mg/dL    Comment: Glucose reference range applies only to samples taken after fasting for at least 8 hours.  Troponin I (High Sensitivity)     Status: Abnormal   Collection Time: 07/08/19  8:34 PM  Result Value Ref Range   Troponin I (High Sensitivity) 35 (H) <18 ng/L    Comment: (NOTE) Elevated high sensitivity troponin I (hsTnI) values and significant  changes across serial measurements may suggest ACS but many other  chronic and acute conditions are known to elevate hsTnI results.  Refer to the "Links" section for chest pain algorithms and additional  guidance. Performed at [facility], [address] [place], [place] [address]   Glucose, capillary     Status: Abnormal   Collection Time: 07/08/19  9:27 PM  Result Value Ref Range   Glucose-Capillary 150 (H) 70 - 99 mg/dL    Comment: Glucose reference range applies  only to samples taken after fasting for at least 8 hours.  Troponin I (High Sensitivity)     Status: Abnormal   Collection Time: 07/08/19  9:40 PM  Result Value Ref Range   Troponin I (High Sensitivity) 34 (H) <18 ng/L    Comment: (NOTE) Elevated high sensitivity troponin I (hsTnI) values and significant  changes across serial measurements may suggest ACS but many other  chronic and acute conditions are known to elevate hsTnI results.  Refer to the "Links" section for chest pain algorithms and additional  guidance. Performed at [facility], [address] [place], [place] [address]   Urine Drug Screen, Qualitative [facility] only)     Status: Abnormal   Collection Time: 07/08/19 10:00 PM  Result Value Ref Range   Tricyclic, Ur Screen NONE DETECTED NONE DETECTED   Amphetamines, Ur Screen NONE DETECTED NONE DETECTED   MDMA (Ecstasy)Ur Screen NONE DETECTED NONE DETECTED   Cocaine Metabolite,Ur [place] NONE DETECTED NONE DETECTED   Opiate, Ur Screen POSITIVE (A) NONE DETECTED   Phencyclidine (PCP) Ur S NONE DETECTED NONE DETECTED   Cannabinoid 50 Ng, Ur [place] NONE DETECTED NONE DETECTED   Barbiturates, Ur Screen NONE DETECTED NONE DETECTED   Benzodiazepine, Ur Scrn NONE DETECTED NONE DETECTED   Methadone Scn, Ur NONE DETECTED NONE DETECTED    Comment: (NOTE) Tricyclics + metabolites, urine    Cutoff 1000 ng/mL Amphetamines + metabolites, urine  Cutoff 1000 ng/mL MDMA (Ecstasy), urine              Cutoff 500 ng/mL Cocaine Metabolite, urine          Cutoff 300 ng/mL Opiate + metabolites, urine        Cutoff 300 ng/mL Phencyclidine (PCP), urine         Cutoff 25 ng/mL Cannabinoid, urine                 Cutoff 50 ng/mL Barbiturates + metabolites, urine  Cutoff 200 ng/mL Benzodiazepine, urine              Cutoff 200 ng/mL Methadone, urine                   Cutoff 300 ng/mL The urine drug screen provides only a preliminary, unconfirmed analytical test result and should not be used for  non-medical purposes. Clinical consideration and professional judgment should be applied to any positive drug screen result due to possible interfering substances.  A more specific alternate chemical method must be used in order to obtain a confirmed analytical result. Gas chromatography / mass spectrometry (GC/MS) is the preferred confirmat ory method. Performed at [facility], [address] [place], [place] [address]   Lipid panel     Status: Abnormal   Collection Time: 07/09/19  4:29 AM  Result Value Ref Range   Cholesterol 155 0 - 200 mg/dL   Triglycerides 106 <150 mg/dL   HDL 32 (L) >40 mg/dL   Total CHOL/HDL Ratio 4.8 RATIO   VLDL 21 0 - 40 mg/dL   LDL Cholesterol 102 (H) 0 - 99 mg/dL    Comment:        Total Cholesterol/HDL:CHD Risk Coronary Heart Disease Risk Table                     Men   Women  1/2 Average Risk   3.4   3.3  Average Risk       5.0   4.4  2 X Average Risk   9.6   7.1  3 X Average Risk  23.4   11.0        Use the calculated Patient Ratio above and the CHD Risk Table to determine the patient's CHD Risk.        ATP III CLASSIFICATION (LDL):  <100     mg/dL   Optimal  100-129  mg/dL   Near or Above                    Optimal  130-159  mg/dL   Borderline  160-189  mg/dL   High  >190     mg/dL   Very High Performed at [facility], [address] [place], [place] [address]   Glucose, capillary     Status: Abnormal   Collection Time: 07/09/19  8:17 AM  Result Value Ref Range   Glucose-Capillary 157 (H) 70 - 99 mg/dL    Comment: Glucose reference range applies only to samples taken after fasting for at least 8 hours.  ECHOCARDIOGRAM COMPLETE     Status: None   Collection Time: 07/09/19  8:46 AM  Result Value Ref Range   Weight 6,020.8 oz   Height 71 in   BP 118/64 mmHg  Glucose, capillary     Status: Abnormal   Collection Time: 07/16/19 11:19 AM  Result Value Ref Range   Glucose-Capillary 187 (H) 70 - 99 mg/dL     Comment: Glucose reference range applies only to samples taken after fasting for at least 8 hours.  Basic metabolic panel     Status: Abnormal   Collection Time: 07/26/19 12:16 PM  Result Value Ref Range   Glucose 185 (H) 65 - 99 mg/dL   BUN 26 (H) 6 - 24 mg/dL   Creatinine, Ser 1.41 (H) 0.76 - 1.27 mg/dL   GFR calc non Af Amer 58 (L) >59 mL/min/1.73   GFR calc Af Amer 67 >59 mL/min/1.73    Comment: **[facility] currently reports eGFR in compliance with the current**   recommendations of the [employer]. [facility] Jeremiah   update reporting as new guidelines are published from the NKF-ASN   Task force.    BUN/Creatinine Ratio 18 9 - 20   Sodium 137 134 - 144 mmol/L   Potassium 5.9 (H) 3.5 - 5.2 mmol/L   Chloride 102 96 - 106 mmol/L   CO2 24 20 - 29 mmol/L   Calcium  9.1 8.7 - 10.2 mg/dL  Basic metabolic panel     Status: Abnormal   Collection Time: 07/29/19 10:30 AM  Result Value Ref Range   Sodium 134 (L) 135 - 145 mmol/L   Potassium 4.3 3.5 - 5.1 mmol/L   Chloride 102 98 - 111 mmol/L   CO2 25 22 - 32 mmol/L   Glucose, Bld 272 (H) 70 - 99 mg/dL    Comment: Glucose reference range applies only to samples taken after fasting for at least 8 hours.   BUN 37 (H) 6 - 20 mg/dL   Creatinine, Ser 1.62 (H) 0.61 - 1.24 mg/dL   Calcium 8.2 (L) 8.9 - 10.3 mg/dL   GFR calc non Af Amer 49 (L) >60 mL/min   GFR calc Af Amer 57 (L) >60 mL/min   Anion gap 7 5 - 15    Comment: Performed at [facility], [address] [place], [place] [address]  Basic Metabolic Panel (BMET)     Status: Abnormal   Collection Time: 08/09/19 12:01 PM  Result Value Ref Range   Sodium 139 135 - 145 mEq/L   Potassium 4.6 3.5 - 5.1 mEq/L   Chloride 101 96 - 112 mEq/L   CO2 33 (H) 19 - 32 mEq/L   Glucose, Bld 198 (H) 70 - 99 mg/dL   BUN 31 (H) 6 - 23 mg/dL   Creatinine, Ser 1.44 0.40 - 1.50 mg/dL   GFR 52.00 (L) >60.00 mL/min   Calcium 8.9 8.4 - 10.5 mg/dL  Hepatic function panel     Status:  None   Collection Time: 08/10/19  9:36 AM  Result Value Ref Range   Total Bilirubin 0.5 0.2 - 1.2 mg/dL   Bilirubin, Direct 0.1 0.0 - 0.3 mg/dL   Alkaline Phosphatase 87 39 - 117 U/L   AST 12 0 - 37 U/L   ALT 14 0 - 53 U/L   Total Protein 6.9 6.0 - 8.3 g/dL   Albumin 3.7 3.5 - 5.2 g/dL  Glucose, capillary     Status: Abnormal   Collection Time: 08/27/19 10:35 AM  Result Value Ref Range   Glucose-Capillary 195 (H) 70 - 99 mg/dL    Comment: Glucose reference range applies only to samples taken after fasting for at least 8 hours.    Assessment/Plan:  Hyperlipidemia lipid control important in reducing the progression of atherosclerotic disease. Continue statin therapy   Type II diabetes mellitus with renal manifestations (HCC) blood glucose control important in reducing the progression of atherosclerotic disease. Also, involved in wound healing. On appropriate medications.   Primary hypertension blood pressure control important in reducing the progression of atherosclerotic disease. On appropriate oral medications.   Morbid obesity ([place]) Worsens his lower extremity swelling and would be of significant benefit to lose weight.  CKD (chronic kidney disease) stage 3, GFR 30-59 ml/min Renal insufficiency certainly exacerbates lower extremity swelling.  Swelling of limb I have had a long discussion with the patient regarding swelling and why it  causes symptoms.  Patient Jeremiah begin wearing graduated compression stockings class 1 (20-30 mmHg) on a daily basis a prescription was given. The patient Jeremiah  beginning wearing the stockings first thing in the morning and removing them in the evening. The patient is instructed specifically not to sleep in the stockings.   In addition, behavioral modification Jeremiah be initiated.  This Jeremiah include frequent elevation, use of over the counter pain medications and exercise such as walking.  I have reviewed systemic  causes for chronic edema such as  liver, kidney and cardiac etiologies.  The patient denies problems with these organ systems.    Consideration for a lymph pump Jeremiah also be made based upon the effectiveness of conservative therapy.  This would help to improve the edema control and prevent sequela such as ulcers and infections   Patient should undergo duplex ultrasound of the venous system to ensure that DVT or reflux is not present.  The patient Jeremiah follow-up with me after the ultrasound.        Leotis Pain 08/31/2019, 10:35 AM   This note was created with Dragon medical transcription system.  Any errors from dictation are unintentional.

## 2019-08-31 NOTE — Assessment & Plan Note (Signed)
I have had a long discussion with the patient regarding swelling and why it  causes symptoms.  Patient will begin wearing graduated compression stockings class 1 (20-30 mmHg) on a daily basis a prescription was given. The patient will  beginning wearing the stockings first thing in the morning and removing them in the evening. The patient is instructed specifically not to sleep in the stockings.   In addition, behavioral modification will be initiated.  This will include frequent elevation, use of over the counter pain medications and exercise such as walking.  I have reviewed systemic causes for chronic edema such as liver, kidney and cardiac etiologies.  The patient denies problems with these organ systems.    Consideration for a lymph pump will also be made based upon the effectiveness of conservative therapy.  This would help to improve the edema control and prevent sequela such as ulcers and infections   Patient should undergo duplex ultrasound of the venous system to ensure that DVT or reflux is not present.  The patient will follow-up with me after the ultrasound.   

## 2019-08-31 NOTE — Assessment & Plan Note (Signed)
Renal insufficiency certainly exacerbates lower extremity swelling.

## 2019-08-31 NOTE — Assessment & Plan Note (Signed)
Worsens his lower extremity swelling and would be of significant benefit to lose weight.

## 2019-08-31 NOTE — Assessment & Plan Note (Signed)
blood glucose control important in reducing the progression of atherosclerotic disease. Also, involved in wound healing. On appropriate medications.  

## 2019-09-16 ENCOUNTER — Telehealth: Payer: Self-pay | Admitting: Nurse Practitioner

## 2019-09-16 ENCOUNTER — Ambulatory Visit: Payer: Medicaid Other | Admitting: Pharmacist

## 2019-09-16 DIAGNOSIS — I639 Cerebral infarction, unspecified: Secondary | ICD-10-CM

## 2019-09-16 DIAGNOSIS — I5032 Chronic diastolic (congestive) heart failure: Secondary | ICD-10-CM

## 2019-09-16 DIAGNOSIS — E1165 Type 2 diabetes mellitus with hyperglycemia: Secondary | ICD-10-CM

## 2019-09-16 NOTE — Chronic Care Management (AMB) (Signed)
Chronic Care Management   Follow Up Note   09/16/2019 Name: ADVITH MARTINE MRN: [id number] DOB: 1971-03-12  Referred by: [name], NP Reason for referral : Chronic Care Management (Medication Management)   Osher D Matheny is a 49 y.o. year old male who is a primary care patient of [name], NP. The CCM team was consulted for assistance with chronic disease management and care coordination needs.    Contacted patient for medication management review.   Review of patient status, including review of consultants reports, relevant laboratory and other test results, and collaboration with appropriate care team members and the patient's provider was performed as part of comprehensive patient evaluation and provision of chronic care management services.    SDOH (Social Determinants of Health) assessments performed: Yes See Care Plan activities for detailed interventions related to SDOH)  SDOH Interventions     Most Recent Value  SDOH Interventions  Financial Strain Interventions Other (Comment)  [[facility] referral]       Outpatient Encounter Medications as of 09/16/2019  Medication Sig  . aspirin 81 MG chewable tablet Chew 1 tablet (81 mg total) by mouth daily.  [name] Kitchen atorvastatin (LIPITOR) 40 MG tablet Take 1 tablet (40 mg total) by mouth daily at 6 PM.  . blood glucose meter kit and supplies Dispense based on patient and insurance preference. Use up to four times daily as directed. (FOR ICD-10 E10.9, E11.9).  . carvedilol (COREG) 12.5 MG tablet Take 1 tablet (12.5 mg total) by mouth 2 (two) times daily.  . empagliflozin (JARDIANCE) 10 MG TABS tablet Take 10 mg by mouth every morning.  . hydrALAZINE (APRESOLINE) 25 MG tablet Take 1 tablet (25 mg total) by mouth 3 (three) times daily.  [name] Kitchen spironolactone (ALDACTONE) 25 MG tablet Take 25 mg by mouth daily.  [name] Kitchen torsemide (DEMADEX) 20 MG tablet Take 2 tablets (40 mg total) by mouth daily.   No  facility-administered encounter medications on file as of 09/16/2019.     Objective:   Goals Addressed              This Visit's Progress     Patient Stated   .  PharmD "I can't afford my medications" (pt-stated)        CARE PLAN ENTRY (see longtitudinal plan of care for additional care plan information)  Current Barriers:  . Polypharmacy; complex patient with multiple comorbidities including DM, CHF, hx CVA, depression . Financial concerns: only has [employer], no prescription drug coverage. Re[facility], he notes that he mailed back to [facility].  . Most recent eGFR: ~49 o CHF (last EF 50-55%); now established w/ [facility]F clinic and [facility] torsemide 40 mg daily, carvedilol 12.5 mg BID, hydralazine 25 mg TID, spironolactone 25 mg; all prescribed, but unclear what patient is taking o T2DM: last A1c 9.4%. Prescribed Jardiance, given a sample at office, but has completed this supply.  - Reports hx metformin, but was d/c "because it made me sick". Notes he is not comfortable with injectable therapy at this time. o Hx ASCVD: ASA 81 mg daily, atorvastatin 40 mg daily  o Vascular disease, edema: established w/ [facility], imaging and procedure tomorrow  Pharmacist Clinical Goal(s):  [name] Kitchen Over the next 90 days, patient will work with PharmD and provider towards optimized medication management  Interventions: . Comprehensive medication review performed; medication list updated in electronic medical record . Inter-disciplinary care team collaboration (see longitudinal plan of care) .  Contacted patient to review status of [facility] application. He noted that he mailed the application back. Called [facility], they never received anything. Patient is at work right now, but notes that he is off tomorrow. I will call back tomorrow to discuss medications and access needs.   Patient Self Care Activities:  . Patient will take medications as  prescribed  Please see past updates related to this goal by clicking on the "Past Updates" button in the selected goal          Plan:  - Scheduled f/u call tomorrow  Catie Darnelle Maffucci, PharmD, [place], [place] Pharmacist [place] [phone]

## 2019-09-16 NOTE — Patient Instructions (Signed)
Visit Information  Goals Addressed              This Visit's Progress     Patient Stated   .  PharmD "I can't afford my medications" (pt-stated)        CARE PLAN ENTRY (see longtitudinal plan of care for additional care plan information)  Current Barriers:  . Polypharmacy; complex patient with multiple comorbidities including DM, CHF, hx CVA, depression . Financial concerns: only has [employer], no prescription drug coverage. Re[facility] from [facility], he notes that he mailed back to [facility].  . Most recent eGFR: ~49 o CHF (last EF 50-55%); now established w/ CHF clinic and [facility]); torsemide 40 mg daily, carvedilol 12.5 mg BID, hydralazine 25 mg TID, spironolactone 25 mg; all prescribed, but unclear what patient is taking o T2DM: last A1c 9.4%. Prescribed Jardiance, given a sample at office, but has completed this supply.  - Reports hx metformin, but was d/c "because it made me sick". Notes he is not comfortable with injectable therapy at this time. o Hx ASCVD: ASA 81 mg daily, atorvastatin 40 mg daily  o Vascular disease, edema: established w/ [facility], imaging and procedure tomorrow  Pharmacist Clinical Goal(s):  [name] Kitchen Over the next 90 days, patient will work with PharmD and provider towards optimized medication management  Interventions: . Comprehensive medication review performed; medication list updated in electronic medical record . Inter-disciplinary care team collaboration (see longitudinal plan of care) . Contacted patient to review status of [facility] application. He noted that he mailed the application back. Called [facility], they never received anything. Patient is at work right now, but notes that he is off tomorrow. I will call back tomorrow to discuss medications and access needs.   Patient Self Care Activities:  . Patient will take medications as prescribed  Please see past updates related to this goal by clicking on  the "Past Updates" button in the selected goal         The patient verbalized understanding of instructions provided today and declined a print copy of patient instruction materials.   Plan:  - Scheduled f/u call tomorrow  Catie Darnelle Maffucci, PharmD, [place], [place] Pharmacist [place] [phone]

## 2019-09-16 NOTE — Telephone Encounter (Signed)
He has seen specialists. Please ask him to make a follow up with me. Very important to get on the diabetes medicine.

## 2019-09-17 ENCOUNTER — Ambulatory Visit (INDEPENDENT_AMBULATORY_CARE_PROVIDER_SITE_OTHER): Payer: Self-pay | Admitting: Nurse Practitioner

## 2019-09-17 ENCOUNTER — Ambulatory Visit (INDEPENDENT_AMBULATORY_CARE_PROVIDER_SITE_OTHER): Payer: Self-pay

## 2019-09-17 ENCOUNTER — Telehealth: Payer: Self-pay

## 2019-09-17 ENCOUNTER — Other Ambulatory Visit: Payer: Self-pay

## 2019-09-17 ENCOUNTER — Other Ambulatory Visit: Payer: Self-pay | Admitting: Nurse Practitioner

## 2019-09-17 ENCOUNTER — Telehealth: Payer: Self-pay | Admitting: Pharmacy Technician

## 2019-09-17 ENCOUNTER — Ambulatory Visit: Payer: Medicaid Other | Admitting: Pharmacist

## 2019-09-17 ENCOUNTER — Encounter (INDEPENDENT_AMBULATORY_CARE_PROVIDER_SITE_OTHER): Payer: Self-pay | Admitting: Nurse Practitioner

## 2019-09-17 VITALS — BP 195/84 | HR 73 | Ht 70.0 in | Wt 390.0 lb

## 2019-09-17 DIAGNOSIS — E1165 Type 2 diabetes mellitus with hyperglycemia: Secondary | ICD-10-CM

## 2019-09-17 DIAGNOSIS — I5032 Chronic diastolic (congestive) heart failure: Secondary | ICD-10-CM

## 2019-09-17 DIAGNOSIS — I639 Cerebral infarction, unspecified: Secondary | ICD-10-CM

## 2019-09-17 DIAGNOSIS — E785 Hyperlipidemia, unspecified: Secondary | ICD-10-CM

## 2019-09-17 DIAGNOSIS — I890 Lymphedema, not elsewhere classified: Secondary | ICD-10-CM

## 2019-09-17 DIAGNOSIS — M7989 Other specified soft tissue disorders: Secondary | ICD-10-CM

## 2019-09-17 MED ORDER — EMPAGLIFLOZIN 10 MG PO TABS: 10.0000 mg | ORAL_TABLET | Freq: Every morning | ORAL | 1 refills | Status: DC

## 2019-09-17 NOTE — Telephone Encounter (Signed)
Prescriptions switched to [facility] as requested.

## 2019-09-17 NOTE — Chronic Care Management (AMB) (Signed)
Chronic Care Management   Follow Up Note   09/17/2019 Name: Jeremiah Vance MRN: [id number] DOB: [birth year]  Referred by: [name], NP Reason for referral : Chronic Care Management (Medication Management)   Jeremiah Vance is a 49 y.o. year old male who is a primary care patient of [name], NP. The CCM team was consulted for assistance with chronic disease management and care coordination needs.    Contacted patient to f/u on medication access needs.   Review of patient status, including review of consultants reports, relevant laboratory and other test results, and collaboration with appropriate care team members and the patient's provider was performed as part of comprehensive patient evaluation and provision of chronic care management services.    SDOH (Social Determinants of Health) assessments performed: Yes See Care Plan activities for detailed interventions related to SDOH)  SDOH Interventions     Most Recent Value  SDOH Interventions  Financial Strain Interventions Other (Comment)  [medication management referral]  Transportation Interventions Other (Comment)  [care guide referral]       Outpatient Encounter Medications as of 09/17/2019  Medication Sig   aspirin 81 MG chewable tablet Chew 1 tablet (81 mg total) by mouth daily.   atorvastatin (LIPITOR) 40 MG tablet Take 1 tablet (40 mg total) by mouth daily at 6 PM.   blood glucose meter kit and supplies Dispense based on patient and insurance preference. Use up to four times daily as directed. (FOR ICD-10 E10.9, E11.9).   carvedilol (COREG) 12.5 MG tablet Take 1 tablet (12.5 mg total) by mouth 2 (two) times daily.   empagliflozin (JARDIANCE) 10 MG TABS tablet Take 10 mg by mouth every morning.   hydrALAZINE (APRESOLINE) 25 MG tablet Take 1 tablet (25 mg total) by mouth 3 (three) times daily.   torsemide (DEMADEX) 20 MG tablet Take 2 tablets (40 mg total) by mouth daily.   [DISCONTINUED]  spironolactone (ALDACTONE) 25 MG tablet Take 25 mg by mouth daily. (Patient not taking: Reported on 09/17/2019)   No facility-administered encounter medications on file as of 09/17/2019.     Objective:   Goals Addressed              This Visit's Progress     Patient Stated     PharmD "I can't afford my medications" (pt-stated)        CARE PLAN ENTRY (see longtitudinal plan of care for additional care plan information)  Current Barriers:   Polypharmacy; complex patient with multiple comorbidities including DM, CHF, hx CVA, depression  Financial concerns: only has [employer], no prescription drug coverage. Re[facility], he notes that he mailed back to [facility]. [facility] noted yesterday that they did not receive  Most recent eGFR: ~49 o CHF (last EF 50-55%); now established w/ CHF clinic and [facility]); torsemide 40 mg daily, carvedilol 12.5 mg BID, hydralazine 25 mg TID - patient has NOT been taking spironolactone o T2DM: last A1c 9.4%. Jardiance 10 mg - Reports hx metformin, but was d/c "because it made me sick". Notes he is not comfortable with injectable therapy at this time. - Last night 140 before supper; has not been checking fastings o Hx ASCVD: ASA 81 mg daily, atorvastatin 40 mg daily  o Vascular disease, edema: established w/ [facility]  Pharmacist Clinical Goal(s):   Over the next 90 days, patient will work with PharmD and provider towards optimized medication management  Interventions:  Comprehensive medication review performed; medication  list updated in electronic medical record  Inter-disciplinary care team collaboration (see longitudinal plan of care)  Reviewed goal A1c, goal fasting, and goal 2 hour post prandial glucose readings.   Reviewed utilization of [facility] to fill medications. Will collaborate w/ [facility] to ask to re-mail application. Patient will plan on dropping paperwork back off at  [facility]  Will collaborate w/ Dr. Garen Lah to notify that patient has NOT been taking spironolactone, and ask to send cardiac med scripts to [facility]  Will collaborate w/ PCP to send script for Jardiance to [facility] [place] referral for Care Guide for transportation support, as patient is currently paying for taxis to get to appointments.  Patient Self Care Activities:   Patient will take medications as prescribed  Please see past updates related to this goal by clicking on the "Past Updates" button in the selected goal          Plan:  - Scheduled f/u call in ~ 6 weeks  Catie Darnelle Maffucci, PharmD, [place], [place] Pharmacist [place] [phone]

## 2019-09-17 NOTE — Patient Instructions (Signed)
Visit Information  Goals Addressed              This Visit's Progress     Patient Stated   .  PharmD "I can't afford my medications" (pt-stated)        CARE PLAN ENTRY (see longtitudinal plan of care for additional care plan information)  Current Barriers:  . Polypharmacy; complex patient with multiple comorbidities including DM, CHF, hx CVA, depression . Financial concerns: only has [employer], no prescription drug coverage. Received [facility] from [facility], he notes that he mailed back to [facility] noted yesterday that they did not receive . Most recent eGFR: ~49 o CHF (last EF 50-55%); now established w/ CHF clinic and [facility]); torsemide 40 mg daily, carvedilol 12.5 mg BID, hydralazine 25 mg TID - patient has NOT been taking spironolactone o T2DM: last A1c 9.4%. Jardiance 10 mg - Reports hx metformin, but was d/c "because it made me sick". Notes he is not comfortable with injectable therapy at this time. - Last night 140 before supper; has not been checking fastings o Hx ASCVD: ASA 81 mg daily, atorvastatin 40 mg daily  o Vascular disease, edema: established w/ [facility]  Pharmacist Clinical Goal(s):  [name] Kitchen Over the next 90 days, patient will work with PharmD and provider towards optimized medication management  Interventions: . Comprehensive medication review performed; medication list updated in electronic medical record . Inter-disciplinary care team collaboration (see longitudinal plan of care) . Reviewed goal A1c, goal fasting, and goal 2 hour post prandial glucose readings.  . Reviewed utilization of [facility] to fill medications. Will collaborate w/ [facility] to ask to re-mail application. Patient will plan on dropping paperwork back off at [facility] . Will collaborate w/ Dr. Garen Lah to notify that patient has NOT been taking spironolactone, and ask to send cardiac med scripts to [facility] . Will collaborate w/ PCP  to send script for Jardiance to [facility] . Placed Care Guide referral for Care Guide for transportation support, as patient is currently paying for taxis to get to appointments.  Patient Self Care Activities:  . Patient will take medications as prescribed  Please see past updates related to this goal by clicking on the "Past Updates" button in the selected goal         The patient verbalized understanding of instructions provided today and declined a print copy of patient instruction materials.   Plan:  - Scheduled f/u call in ~ 6 weeks  Catie Darnelle Maffucci, PharmD, [place], [place] Pharmacist [place] [phone]

## 2019-09-17 NOTE — Telephone Encounter (Signed)
Spoke with patient about making eligibility appointment for completion of [facility] Patient Intake Application and other documentation.  Patient declined.  Stated that he preferred that I mail him another application and he would bring in to our clinic.  Patient stated that he completed the first application but not sure what his sister has done with it.   Went over paperwork with patient while on the phone and informed patient that we will need last 30 days of paystubs, 2020 Federal Tax Return, last 30 days of bank statements and bill with his name and address.  Patient verbally acknowledged that he understood the financial documentation required by [facility].    Sherilyn Dacosta Care Manager [facility]

## 2019-09-17 NOTE — Telephone Encounter (Signed)
-----   Message from Debbe Odea, MD sent at 09/17/2019  1:05 PM EDT -----  ----- Message ----- From: Lourena Simmonds, [facility] Sent: 09/17/2019  11:22 AM EDT To: Delma Freeze, FNP, Debbe Odea, MD  Dr. Azucena Cecil and Inetta Fermo,  I'm working with this patient on medication cost concerns. We are getting him connected into [facility] to get his medications for free, since he does not have prescription coverage (just family planning Medicaid). If you could send all of his cardiac medication prescriptions (ASA, atorvastatin, carvedilol, hydralazine, torsemide) to [facility], that would be GREAT! It's on his preferred pharmacy list. He never re-started spironolactone, it had been held at last hospital discharge d/t AKI.   Thanks, please let me know if you have any questions!  Catie Feliz Beam, PharmD, [facility] Clinical Pharmacist [facility] [phone]

## 2019-09-17 NOTE — Telephone Encounter (Signed)
I'm working with this patient on medication cost concerns. We are getting him connected into [facility] to get his medications for free, since he does not have prescription coverage (just family planning Medicaid). If you could send all of his cardiac medication prescriptions (ASA, atorvastatin, carvedilol, hydralazine, torsemide) to [facility], that would be GREAT! It's on his preferred

## 2019-09-20 ENCOUNTER — Other Ambulatory Visit: Payer: Self-pay | Admitting: Family

## 2019-09-20 ENCOUNTER — Encounter (INDEPENDENT_AMBULATORY_CARE_PROVIDER_SITE_OTHER): Payer: Self-pay | Admitting: Nurse Practitioner

## 2019-09-20 DIAGNOSIS — I5032 Chronic diastolic (congestive) heart failure: Secondary | ICD-10-CM

## 2019-09-20 DIAGNOSIS — I10 Essential (primary) hypertension: Secondary | ICD-10-CM

## 2019-09-20 MED ORDER — ATORVASTATIN CALCIUM 40 MG PO TABS: 40.0000 mg | ORAL_TABLET | Freq: Every day | ORAL | 3 refills | Status: DC

## 2019-09-20 MED ORDER — CARVEDILOL 12.5 MG PO TABS: 12.5000 mg | ORAL_TABLET | Freq: Two times a day (BID) | ORAL | 3 refills | Status: DC

## 2019-09-20 MED ORDER — HYDRALAZINE HCL 25 MG PO TABS: 25.0000 mg | ORAL_TABLET | Freq: Three times a day (TID) | ORAL | 3 refills | Status: DC

## 2019-09-20 MED ORDER — ASPIRIN 81 MG PO CHEW: 81.0000 mg | CHEWABLE_TABLET | Freq: Every day | ORAL | 3 refills | Status: DC

## 2019-09-20 MED ORDER — TORSEMIDE 20 MG PO TABS: 40.0000 mg | ORAL_TABLET | Freq: Every day | ORAL | 3 refills | Status: DC

## 2019-09-20 NOTE — Telephone Encounter (Signed)
He needs a f/up appt with me in July when he can schedule it. He needs to bring in the medication bottles that he is taking so I know what he is on since he has seen specialists. Thanks

## 2019-09-20 NOTE — Telephone Encounter (Signed)
When do you want patient to follow up?

## 2019-09-20 NOTE — Progress Notes (Signed)
Subjective:    Patient ID: Jeremiah Vance, male    DOB: 01-Apr-1971, 49 y.o.   MRN: [id number] Chief Complaint  Patient presents with  . Follow-up    U/S follow up    [name] is a 49 y.o. male.  The patient presents today for noninvasive studies related to lower extremity edema.  Patient has severe edema with bilateral stasis dermatitis.  The patient denies any previous history of DVT or superficial thrombophlebitis.  The patient does have known congestive heart failure as well as chronic kidney disease and is currently on diuretics.  The patient has had blisters from swelling prior.  The patient does not endorse having any weeping at this time.  Both legs are equally affected with his edema.  He denies any fever, chills, nausea, vomiting or diarrhea.  He denies any recent episodes of cellulitis.  Today noninvasive studies show no evidence of DVT or superficial thrombophlebitis bilaterally.  No evidence of deep venous insufficiency seen bilaterally.  There is no evidence of superficial venous reflux seen in the short saphenous vein bilaterally.  There is no evidence of venous reflux in the left lower extremity in the great saphenous vein.  The right lower extremity has evidence of reflux in the great saphenous vein at the level of the knee.       Review of Systems  Cardiovascular: Positive for leg swelling.  Skin: Positive for rash.  All other systems reviewed and are negative.      Objective:   Physical Exam Vitals reviewed.  Constitutional:      Appearance: He is obese.  HENT:     Head: Normocephalic.  Cardiovascular:     Rate and Rhythm: Normal rate.  Pulmonary:     Effort: Pulmonary effort is normal.     Breath sounds: Normal breath sounds.  Musculoskeletal:     Right lower leg: 3+ Edema present.     Left lower leg: 3+ Edema present.  Neurological:     Mental Status: He is alert and oriented to person, place, and time.  Psychiatric:        Mood and Affect:  Mood normal.        Behavior: Behavior normal.        Thought Content: Thought content normal.        Judgment: Judgment normal.     BP (!) 195/84   Pulse 73   Ht '5\' 10"'  (1.778 m)   Wt (!) 390 lb (176.9 kg)   BMI 55.96 kg/m   Past Medical History:  Diagnosis Date  . Allergies   . Arthritis   . CHF (congestive heart failure) ([place])    "coinsided w/kidney problems I was having 06/2017"  . Chicken pox   . CKD (chronic kidney disease) stage 3, GFR 30-59 ml/min 06/2017  . Depression   . High cholesterol   . History of cardiomyopathy    LVEF 40 to 45% in April 2019 - subsequently normalized  . Hypertension   . Ischemic stroke ([place])    Small left internal capsule infarct due to lacunar disease  . Morbid obesity ([place])   . Stroke ([place]) 08/2017   "right sided weakness since; getting stronger though" (10/22/2017)  . Type 2 diabetes mellitus ([place])     Social History   Socioeconomic History  . Marital status: Legally Separated    Spouse name: Not on file  . Number of children: Not on file  . Years of education: Not on file  .  Highest education level: Not on file  Occupational History  . Not on file  Tobacco Use  . Smoking status: Never Smoker  . Smokeless tobacco: Never Used  Vaping Use  . Vaping Use: Never used  Substance and Sexual Activity  . Alcohol use: Not Currently    Comment: 10/22/2017 "sober since 2000"  . Drug use: Never  . Sexual activity: Not Currently  Other Topics Concern  . Not on file  Social History Narrative   He lives with his sister , Jeremiah Vance and she is his MPOA after his stokes   Social Determinants of Health   Financial Resource Strain: High Risk  . Difficulty of Paying Living Expenses: Hard  Food Insecurity:   . Worried About Charity fundraiser in the Last Year:   . Arboriculturist in the Last Year:   Transportation Needs: Unmet Transportation Needs  . Lack of Transportation (Medical): Yes  . Lack of Transportation (Non-Medical): Yes    Physical Activity:   . Days of Exercise per Week:   . Minutes of Exercise per Session:   Stress:   . Feeling of Stress :   Social Connections:   . Frequency of Communication with Friends and Family:   . Frequency of Social Gatherings with Friends and Family:   . Attends Religious Services:   . Active Member of Clubs or Organizations:   . Attends Archivist Meetings:   [name] Kitchen Marital Status:   Intimate Partner Violence:   . Fear of Current or Ex-Partner:   . Emotionally Abused:   [name] Kitchen Physically Abused:   . Sexually Abused:     Past Surgical History:  Procedure Laterality Date  . ABDOMINAL HERNIA REPAIR  2008; 10/22/2017   "scope; OPEN REPAIR INCARCERATED VENTRAL HERNIA  . HERNIA REPAIR    . KNEE ARTHROSCOPY Right 1989  . VENTRAL HERNIA REPAIR N/A 10/22/2017   Procedure: OPEN REPAIR INCARCERATED VENTRAL HERNIA;  Surgeon: Excell Seltzer, MD;  Location: [facility];  Service: General;  Laterality: N/A;    Family History  Problem Relation Age of Onset  . Diabetes Father   . Heart disease Father   . Arthritis Father   . Hearing loss Father   . Hyperlipidemia Father   . Heart attack Father   . Hypertension Father   . Stroke Father   . Diabetes Sister   . Diabetes Mother   . Stroke Mother   . Arthritis Mother   . Depression Mother   . Heart disease Mother   . Hypertension Mother   . Learning disabilities Mother   . Mental illness Mother   . Diabetes Maternal Grandmother   . Heart disease Maternal Grandmother   . Depression Maternal Grandmother   . Hyperlipidemia Maternal Grandmother   . Hypertension Maternal Grandmother   . Stroke Maternal Grandmother   . Stroke Paternal Grandfather   . Heart disease Paternal Grandfather   . Arthritis Paternal Grandfather   . Heart attack Paternal Grandfather   . Hypertension Maternal Grandfather   . Hyperlipidemia Maternal Grandfather   . Stroke Maternal Grandfather   . Arthritis Paternal Grandmother   . Hearing loss Paternal  Grandmother   . Depression Sister   . Diabetes Sister   . Hypertension Sister   . Mental illness Sister     Allergies  Allergen Reactions  . Pollen Extract        Assessment & Plan:   1. Lymphedema No surgery or intervention at this point in time.  I have reviewed my discussion with the patient regarding venous insufficiency and secondary lymph edema and why it  causes symptoms. I have discussed with the patient the chronic skin changes that accompany these problems and the long term sequela such as ulceration and infection.  Patient will continue wearing graduated compression stockings class 1 (20-30 mmHg) on a daily basis a prescription was given to the patient to keep this updated. The patient will  put the stockings on first thing in the morning and removing them in the evening. The patient is instructed specifically not to sleep in the stockings.  In addition, behavioral modification including elevation during the day will be continued.  Diet and salt restriction was also discussed.  The patient will continue with conservative therapy for the next 6 months.  We will have the patient follow-up at that time.  Pending the progression with conservative therapy, consideration for lymph pump can be made at that time.   2. Hyperlipidemia, unspecified hyperlipidemia type Continue statin as ordered and reviewed, no changes at this time   3. Morbid obesity ([place]) Obesity worsens lower extremity edema.  Weight loss would also aid swelling.   Current Outpatient Medications on File Prior to Visit  Medication Sig Dispense Refill  . aspirin 81 MG chewable tablet Chew 1 tablet (81 mg total) by mouth daily. 30 tablet 0  . atorvastatin (LIPITOR) 40 MG tablet Take 1 tablet (40 mg total) by mouth daily at 6 PM. 90 tablet 3  . blood glucose meter kit and supplies Dispense based on patient and insurance preference. Use up to four times daily as directed. (FOR ICD-10 E10.9, E11.9). 1 each 0  .  carvedilol (COREG) 12.5 MG tablet Take 1 tablet (12.5 mg total) by mouth 2 (two) times daily. 180 tablet 3  . hydrALAZINE (APRESOLINE) 25 MG tablet Take 1 tablet (25 mg total) by mouth 3 (three) times daily. 270 tablet 3  . torsemide (DEMADEX) 20 MG tablet Take 2 tablets (40 mg total) by mouth daily. 180 tablet 3   No current facility-administered medications on file prior to visit.    There are no Patient Instructions on file for this visit. No follow-ups on file.   Kris Hartmann, NP

## 2019-09-21 NOTE — Telephone Encounter (Signed)
LMOM for patient to call back to schedule a follow up appt with Selena Batten in july

## 2019-09-22 NOTE — Telephone Encounter (Signed)
Patient scheduled for 10/14/19 at 1:30pm

## 2019-10-14 ENCOUNTER — Ambulatory Visit (INDEPENDENT_AMBULATORY_CARE_PROVIDER_SITE_OTHER): Payer: Medicaid Other | Admitting: Nurse Practitioner

## 2019-10-14 ENCOUNTER — Other Ambulatory Visit: Payer: Self-pay

## 2019-10-14 VITALS — BP 146/78 | HR 72 | Temp 98.1°F | Resp 16 | Wt 387.0 lb

## 2019-10-14 DIAGNOSIS — E1169 Type 2 diabetes mellitus with other specified complication: Secondary | ICD-10-CM

## 2019-10-14 DIAGNOSIS — I10 Essential (primary) hypertension: Secondary | ICD-10-CM

## 2019-10-14 DIAGNOSIS — E1165 Type 2 diabetes mellitus with hyperglycemia: Secondary | ICD-10-CM

## 2019-10-14 DIAGNOSIS — M7989 Other specified soft tissue disorders: Secondary | ICD-10-CM

## 2019-10-14 DIAGNOSIS — Z8673 Personal history of transient ischemic attack (TIA), and cerebral infarction without residual deficits: Secondary | ICD-10-CM

## 2019-10-14 DIAGNOSIS — I5030 Unspecified diastolic (congestive) heart failure: Secondary | ICD-10-CM

## 2019-10-14 DIAGNOSIS — G4733 Obstructive sleep apnea (adult) (pediatric): Secondary | ICD-10-CM

## 2019-10-14 DIAGNOSIS — N183 Chronic kidney disease, stage 3 unspecified: Secondary | ICD-10-CM

## 2019-10-14 LAB — CBC WITH DIFFERENTIAL/PLATELET
Basophils Absolute: 0.1 10*3/uL (ref 0.0–0.1)
Basophils Relative: 0.7 % (ref 0.0–3.0)
Eosinophils Absolute: 0.3 10*3/uL (ref 0.0–0.7)
Eosinophils Relative: 2.9 % (ref 0.0–5.0)
HCT: 41.4 % (ref 39.0–52.0)
Hemoglobin: 13.6 g/dL (ref 13.0–17.0)
Lymphocytes Relative: 18.7 % (ref 12.0–46.0)
Lymphs Abs: 1.9 10*3/uL (ref 0.7–4.0)
MCHC: 32.7 g/dL (ref 30.0–36.0)
MCV: 87 fl (ref 78.0–100.0)
Monocytes Absolute: 0.7 10*3/uL (ref 0.1–1.0)
Monocytes Relative: 7.2 % (ref 3.0–12.0)
Neutro Abs: 7.2 10*3/uL (ref 1.4–7.7)
Neutrophils Relative %: 70.5 % (ref 43.0–77.0)
Platelets: 265 10*3/uL (ref 150.0–400.0)
RBC: 4.76 Mil/uL (ref 4.22–5.81)
RDW: 14.7 % (ref 11.5–15.5)
WBC: 10.3 10*3/uL (ref 4.0–10.5)

## 2019-10-14 LAB — COMPREHENSIVE METABOLIC PANEL
ALT: 16 U/L (ref 0–53)
AST: 13 U/L (ref 0–37)
Albumin: 3.5 g/dL (ref 3.5–5.2)
Alkaline Phosphatase: 100 U/L (ref 39–117)
BUN: 20 mg/dL (ref 6–23)
CO2: 30 mEq/L (ref 19–32)
Calcium: 8.4 mg/dL (ref 8.4–10.5)
Chloride: 101 mEq/L (ref 96–112)
Creatinine, Ser: 1.62 mg/dL — ABNORMAL HIGH (ref 0.40–1.50)
GFR: 45.36 mL/min — ABNORMAL LOW (ref >60.00)
Glucose, Bld: 304 mg/dL — ABNORMAL HIGH (ref 70–99)
Potassium: 4.6 mEq/L (ref 3.5–5.1)
Sodium: 139 mEq/L (ref 135–145)
Total Bilirubin: 0.7 mg/dL (ref 0.2–1.2)
Total Protein: 6.4 g/dL (ref 6.0–8.3)

## 2019-10-14 LAB — HEMOGLOBIN A1C: Hgb A1c MFr Bld: 10.9 % — ABNORMAL HIGH (ref 4.6–6.5)

## 2019-10-14 LAB — LIPID PANEL
Cholesterol: 140 mg/dL (ref 0–200)
HDL: 36.9 mg/dL — ABNORMAL LOW (ref >39.00)
LDL Cholesterol: 71 mg/dL (ref 0–99)
NonHDL: 103.08
Total CHOL/HDL Ratio: 4
Triglycerides: 162 mg/dL — ABNORMAL HIGH (ref 0.0–149.0)
VLDL: 32.4 mg/dL (ref 0.0–40.0)

## 2019-10-14 LAB — TSH: TSH: 0.78 u[IU]/mL (ref 0.35–4.50)

## 2019-10-14 NOTE — Patient Instructions (Signed)
Please go to the lab today.  Continue to follow with your cardiology specialists  Continue to follow with podiatry  Continue to follow with orthopedics  Continue to follow-up with vascular regarding leg swelling and follow the recommendations regarding compression stockings.  Monitor the legs closely for sores.  If you get a little open area, Vaseline with a single Q-tip no double dipping and a Telfa dressing with paper tape.  Continuously using Neosporin all the time will irritate the skin.  Keep a close watch on that lower extremities and feet to make sure he did not get a bad cellulitis or skin infection.  You plan to have [employer] through [facility] in October.  Please get back to the eye doctor at that time.  Please bring in blood sugar recordings when you come in next time.  Patient not to run out of medications, or if it looks like you are getting close to running out give our office a call.  You can check your fasting blood sugars 2-3 times a week.  You can check your blood sugar 2 hours after eating either lunch or supper 2-3  times a week.  Please write these down notebook and bring the notebook in.  I have given you more diet information that you might enjoy reading.  The [facility] low glycemic diet.Dr. Melina Schools low carb recommendations. NET CARB is carbohydrate minus the fiber.   You are due for a tetanus vaccine.  Follow-up office visit in 3 months.

## 2019-10-14 NOTE — Progress Notes (Signed)
Established Patient Office Visit  Subjective:  Patient ID: Jeremiah Vance, male    DOB: 1971-03-22  Age: 49 y.o. MRN: [id number]  CC:  Chief Complaint  Patient presents with   Follow-up    HPI Jeremiah Vance is a 49 year old patient who establish care on 08/02/2019 for HTN, HLD, DM2, CVA, CKD-3, CHF, morbid obesity, OSA, lymphedema, right shoulder pain.  He presented off diabetic medication. He was having right shoulder pain and bilat lymphedema with venous stasis changes,  and he is actively followed by cardiology, the heart failure group, vascular, and orthopedics for right frozen shoulder and going through PT-trying to avoid surgery.  He presents today on new start Jardiance and to address diabetes.    T2DM: Uncontrolled. Goal A1C<7. He was Dx late 20's-early 30's and he has only been treated with Metformin.  Off Metformin d/t CKD. On Jardiance 10 mg daily. Hemoglobin A1c 9.4, BMI 52, and had cut back on sweets carbohydrates and following more of a low glycemic index diet. He was seen by Chronic Care Management and set up to get medications through medication management clinic. Tolerating Jadiance- voiding OK. No yeast or groin infections.No urgency or frequency much.  FBS from memory 135 and after supper 30 min- 200s. Working at [employer] as Psychologist, counselling and he is walking around more. He is loving his work and the interaction with customers. Tolerating the activity well.   Oct getting wal [facility] and plans to get vison checked- had it >3 mos ago and is legally blind left eye and rihgt eye needs dome work. He uses magnifying glass to read receipts. 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 Plans to see Podiatry.  Vascular documents trace DP pulses.   Lab Results  Component Value Date   HGBA1C 10.9 (H) 10/14/2019   Essential HTN/CHF with EF 50-55% and grade 2 diastolic dysfunction: Uncontrolled, goal blood pressure less than 130/80. CKD3/lymphedema: Reports chronic edema for 20  years.Demadex 20 mg- take 2 tablets daily, Apresoline 25 mg 3 times daily, carvedilol 12.5 mg twice daily. Aspirin 81 mg daily   Blood pressure is elevated today.  He reports he has been taking his medications.  He did not bring them in today.  BP Readings from Last 3 Encounters:  10/14/19 (!) 146/78  09/17/19 (!) 195/84  08/31/19 138/79   Pulse Readings from Last 3 Encounters:  10/14/19 72  09/17/19 73  08/31/19 (!) 59   HDL: Improving.  Goal LDL less than 70. On atorvastatin 40 mg daily, ASA 81 mg daily HX CVA: 2019 x 2 small left internal capsule lacunar stroke, managed with antiplatelet regimen and statin.  No intracardiac source of thrombus.  Slurred speech initially and that has improved.  He has trouble sometimes finding his words, and feels like his thoughts are just a little slower.   Lab Results  Component Value Date   CHOL 140 10/14/2019   HDL 36.90 (L) 10/14/2019   LDLCALC 71 10/14/2019   TRIG 162.0 (H) 10/14/2019   CHOLHDL 4 10/14/2019   BMI 55.53/Morbid obesity/Hepatic steatosis and CTA showed nodular liver. Lab Results  Component Value Date   ALT 16 10/14/2019   AST 13 10/14/2019   ALKPHOS 100 10/14/2019   BILITOT 0.7 10/14/2019   Wt Readings from Last 3 Encounters:  10/14/19 (!) 387 lb (175.5 kg)  09/17/19 (!) 390 lb (176.9 kg)  08/31/19 (!) 375 lb (170.1 kg)    OSA: Nonadherent to CPAP  Past Medical  History:  Diagnosis Date   Acute ischemic stroke ([place]) 09/06/2017   Acute renal failure superimposed on stage 3a chronic kidney disease ([place]) 07/08/2019   Acute right-sided weakness    Allergies    Anasarca    Anemia 07/10/2017   Arthritis    CHF (congestive heart failure) ([place])    "coinsided w/kidney problems I was having 06/2017"   Chicken pox    CKD (chronic kidney disease) stage 3, GFR 30-59 ml/min 06/2017   Depression    Elevated troponin 07/08/2019   High cholesterol    History of cardiomyopathy    LVEF 40 to 45% in April 2019 -  subsequently normalized   Hyperbilirubinemia 07/10/2017   Hypertension    Ischemic stroke ([place])    Small left internal capsule infarct due to lacunar disease   Morbid obesity ([place])    Normocytic anemia 12/16/2017   Recurrent incisional hernia with incarceration s/p repair 10/22/2017 10/21/2017   Stroke ([place]) 08/2017   "right sided weakness since; getting stronger though" (10/22/2017)   Type 2 diabetes mellitus ([place])     Past Surgical History:  Procedure Laterality Date   ABDOMINAL HERNIA REPAIR  2008; 10/22/2017   "scope; OPEN REPAIR INCARCERATED VENTRAL HERNIA   HERNIA REPAIR     KNEE ARTHROSCOPY Right 1989   VENTRAL HERNIA REPAIR N/A 10/22/2017   Procedure: OPEN REPAIR INCARCERATED VENTRAL HERNIA;  Surgeon: Excell Seltzer, MD;  Location: [place];  Service: General;  Laterality: N/A;    Family History  Problem Relation Age of Onset   Diabetes Father    Heart disease Father    Arthritis Father    Hearing loss Father    Hyperlipidemia Father    Heart attack Father    Hypertension Father    Stroke Father    Diabetes Sister    Diabetes Mother    Stroke Mother    Arthritis Mother    Depression Mother    Heart disease Mother    Hypertension Mother    Learning disabilities Mother    Mental illness Mother    Diabetes Maternal Grandmother    Heart disease Maternal Grandmother    Depression Maternal Grandmother    Hyperlipidemia Maternal Grandmother    Hypertension Maternal Grandmother    Stroke Maternal Grandmother    Stroke Paternal Grandfather    Heart disease Paternal Grandfather    Arthritis Paternal Grandfather    Heart attack Paternal Grandfather    Hypertension Maternal Grandfather    Hyperlipidemia Maternal Grandfather    Stroke Maternal Grandfather    Arthritis Paternal Grandmother    Hearing loss Paternal Grandmother    Depression Sister    Diabetes Sister    Hypertension Sister    Mental illness Sister      Social History   Socioeconomic History   Marital status: Legally Separated    Spouse name: Not on file   Number of children: Not on file   Years of education: Not on file   Highest education level: Not on file  Occupational History   Not on file  Tobacco Use   Smoking status: Never Smoker   Smokeless tobacco: Never Used  Vaping Use   Vaping Use: Never used  Substance and Sexual Activity   Alcohol use: Not Currently    Comment: 10/22/2017 "sober since 2000"   Drug use: Never   Sexual activity: Not Currently  Other Topics Concern   Not on file  Social History Narrative   He lives with his sister ,  [name] and she is his MPOA after his stokes   Social Determinants of Radio broadcast assistant Strain: High Risk   Difficulty of Paying Living Expenses: Hard  Food Insecurity:    Worried About Charity fundraiser in the Last Year:    Arboriculturist in the Last Year:   Transportation Needs: Public librarian (Medical): Yes   Lack of Transportation (Non-Medical): Yes  Physical Activity:    Days of Exercise per Week:    Minutes of Exercise per Session:   Stress:    Feeling of Stress :   Social Connections:    Frequency of Communication with Friends and Family:    Frequency of Social Gatherings with Friends and Family:    Attends Religious Services:    Active Member of Clubs or Organizations:    Attends Music therapist:    Marital Status:   Intimate Partner Violence:    Fear of Current or Ex-Partner:    Emotionally Abused:    Physically Abused:    Sexually Abused:     Outpatient Medications Prior to Visit  Medication Sig Dispense Refill   aspirin 81 MG chewable tablet Chew 1 tablet (81 mg total) by mouth daily. 90 tablet 3   atorvastatin (LIPITOR) 40 MG tablet Take 1 tablet (40 mg total) by mouth daily at 6 PM. 90 tablet 3   blood glucose meter kit and supplies Dispense based on  patient and insurance preference. Use up to four times daily as directed. (FOR ICD-10 E10.9, E11.9). 1 each 0   carvedilol (COREG) 12.5 MG tablet Take 1 tablet (12.5 mg total) by mouth 2 (two) times daily. 180 tablet 3   empagliflozin (JARDIANCE) 10 MG TABS tablet Take 1 tablet (10 mg total) by mouth every morning. 90 tablet 1   hydrALAZINE (APRESOLINE) 25 MG tablet Take 1 tablet (25 mg total) by mouth 3 (three) times daily. 270 tablet 3   torsemide (DEMADEX) 20 MG tablet Take 2 tablets (40 mg total) by mouth daily. 180 tablet 3   No facility-administered medications prior to visit.    Allergies  Allergen Reactions   Pollen Extract    Review of Systems  Constitutional: Negative for chills and fever.  HENT: Negative for congestion.   Eyes: Negative.   Respiratory: Negative for cough.   Cardiovascular: Positive for leg swelling. Negative for chest pain and palpitations.  Gastrointestinal: Negative for abdominal pain and blood in stool.  Genitourinary: Positive for difficulty urinating.  Musculoskeletal:       Right shoulder improving- not at goal- PT   Skin: Positive for color change.       Venous stasis changes, intermittent lower ext ulcers-   Neurological: Negative.   Hematological: Negative.   Psychiatric/Behavioral:       He has no depression/anxiety concerns       Objective:    Physical Exam Vitals reviewed.  Constitutional:      Appearance: He is obese.  Cardiovascular:     Rate and Rhythm: Normal rate and regular rhythm.     Heart sounds: Normal heart sounds.  Pulmonary:     Effort: Pulmonary effort is normal.     Breath sounds: Normal breath sounds.  Abdominal:     Palpations: Abdomen is soft.     Tenderness: There is no abdominal tenderness.  Skin:    General: Skin is warm and dry.     Findings: Erythema present.  Comments: Bilat lower lymphedema, venous stasis changes with a few skin tears.   Neurological:     Mental Status: He is alert and  oriented to person, place, and time. Mental status is at baseline.  Psychiatric:        Mood and Affect: Mood normal.        Behavior: Behavior normal.     BP (!) 146/78    Pulse 72    Temp 98.1 F (36.7 C)    Resp 16    Wt (!) 387 lb (175.5 kg)    SpO2 98%    BMI 55.53 kg/m  Wt Readings from Last 3 Encounters:  10/14/19 (!) 387 lb (175.5 kg)  09/17/19 (!) 390 lb (176.9 kg)  08/31/19 (!) 375 lb (170.1 kg)     Health Maintenance Due  Topic Date Due   FOOT EXAM  11/19/2018    There are no preventive care reminders to display for this patient.  Lab Results  Component Value Date   TSH 0.78 10/14/2019   Lab Results  Component Value Date   WBC 10.3 10/14/2019   HGB 13.6 10/14/2019   HCT 41.4 10/14/2019   MCV 87.0 10/14/2019   PLT 265.0 10/14/2019   Lab Results  Component Value Date   NA 139 10/14/2019   K 4.6 10/14/2019   CO2 30 10/14/2019   GLUCOSE 304 (H) 10/14/2019   BUN 20 10/14/2019   CREATININE 1.62 (H) 10/14/2019   BILITOT 0.7 10/14/2019   ALKPHOS 100 10/14/2019   AST 13 10/14/2019   ALT 16 10/14/2019   PROT 6.4 10/14/2019   ALBUMIN 3.5 10/14/2019   CALCIUM 8.4 10/14/2019   ANIONGAP 7 07/29/2019   GFR 45.36 (L) 10/14/2019   Lab Results  Component Value Date   CHOL 140 10/14/2019   Lab Results  Component Value Date   HDL 36.90 (L) 10/14/2019   Lab Results  Component Value Date   LDLCALC 71 10/14/2019   Lab Results  Component Value Date   TRIG 162.0 (H) 10/14/2019   Lab Results  Component Value Date   CHOLHDL 4 10/14/2019   Lab Results  Component Value Date   HGBA1C 10.9 (H) 10/14/2019      Assessment & Plan:   Problem List Items Addressed This Visit      Cardiovascular and Mediastinum   (HFpEF) heart failure with preserved ejection fraction (HCC) (Chronic)      CHF: Chronic diastolic CHF with systolic dysfunction /QMGN/0/03/70 recently seen by cardiology and medications were changed.  Atypical chest pain thought to be related to  hypertensive urgency with medication non adherence.  No further cardiac testing planned.       Essential hypertension    BP elevated today. Demadex 20 mg- take 2 tablets daily, Apresoline 25 mg 3 times daily, carvedilol 12.5 mg twice daily. Plan: Followed by Cardiology. Labs to check renal function. Consider adding low dose Cozaar.  Would not add Norvasc with edema. Heart rate dips below 60 so would hesitate to add a beta blocker. Advised to eat a lower sodium diet. Hr is 59-low 70's. Recent Echo with normal EF. Needs urine micro albumin.   BP Readings from Last 3 Encounters:  10/14/19 (!) 146/78  09/17/19 (!) 195/84  08/31/19 138/79        Relevant Orders   CBC with Differential/Platelet (Completed)   TSH (Completed)   Comprehensive metabolic panel (Completed)   Hemoglobin A1c (Completed)   Lipid panel (Completed)     Respiratory  Moderate obstructive sleep apnea (Chronic)    Non compliant . Address with health insurance in Oct.         Endocrine   Type 2 diabetes mellitus with hyperglycemia (HCC)    On Jardiance 10 mg daily. Hemoglobin A1c 9.4, BMI 52, and had cut back on sweets carbohydrates and following more of a low glycemic index diet. GFR will limit titration of Jardiance. Needs to follow diet and wt loss.       Diabetes mellitus ([place]) - Primary   Relevant Orders   CBC with Differential/Platelet (Completed)   TSH (Completed)   Comprehensive metabolic panel (Completed)   Hemoglobin A1c (Completed)   Lipid panel (Completed)     Genitourinary   CKD (chronic kidney disease) stage 3, GFR 30-59 ml/min     Other   Morbid obesity ([place])    He has  cut back on sweets carbohydrates and following more of a low glycemic index diet. Given more information. Waiting on private health insurance in Oct. May be able to get in to see RD.       Swelling of limb    Followed by vascular. Support stockings- getting a new measure and plans to start wearing.       History of stroke     ASA 41 mg daily.  Goal to try to get diabetes, hyperlipidemia, hypertension, sleep apnea under good  control.         No orders of the defined types were placed in this encounter.   Continue to follow with your cardiology specialists  Continue to follow with podiatry  Continue to follow with orthopedics  Continue to follow-up with vascular regarding leg swelling and follow the recommendations regarding compression stockings.  Monitor the legs closely for sores.  If you get a little open area, Vaseline with a single Q-tip no double dipping and a Telfa dressing with paper tape.  Continuously using Neosporin all the time will irritate the skin.  Keep a close watch on that lower extremities and feet to make sure he did not get a bad cellulitis or skin infection.  You plan to have [employer] through [facility] in October.  Please get back to the eye doctor at that time.  Please bring in blood sugar recordings when you come in next time.  Patient not to run out of medications, or if it looks like you are getting close to running out give our office a call.  You can check your fasting blood sugars 2-3 times a week.  You can check your blood sugar 2 hours after eating either lunch or supper 2-3  times a week.  Please write these down notebook and bring the notebook in.  I have given you more diet information that you might enjoy reading.  The [facility] low glycemic diet.Dr. Lupita Dawn low carb recommendations. NET CARB is carbohydrate minus the fiber.   You are due for a tetanus vaccine.  Follow-up: Return in about 3 months (around 01/14/2020).   This visit occurred during the SARS-CoV-2 public health emergency.  Safety protocols were in place, including screening questions prior to the visit, additional usage of staff PPE, and extensive cleaning of exam room while observing appropriate contact time as indicated for disinfecting solutions.   Denice Paradise,  NP

## 2019-10-17 ENCOUNTER — Encounter: Payer: Self-pay | Admitting: Nurse Practitioner

## 2019-10-17 DIAGNOSIS — I890 Lymphedema, not elsewhere classified: Secondary | ICD-10-CM | POA: Insufficient documentation

## 2019-10-17 DIAGNOSIS — Z8673 Personal history of transient ischemic attack (TIA), and cerebral infarction without residual deficits: Secondary | ICD-10-CM | POA: Insufficient documentation

## 2019-10-17 NOTE — Assessment & Plan Note (Addendum)
He has  cut back on sweets carbohydrates and following more of a low glycemic index diet. Given more information. Waiting on private health insurance in Oct. May be able to get in to see RD.

## 2019-10-17 NOTE — Assessment & Plan Note (Signed)
BP elevated today. Demadex 20 mg- take 2 tablets daily, Apresoline 25 mg 3 times daily, carvedilol 12.5 mg twice daily. Plan: Followed by Cardiology. Labs to check renal function. Consider adding low dose Cozaar.  Would not add Norvasc with edema. Heart rate dips below 60 so would hesitate to add a beta blocker. Advised to eat a lower sodium diet. Hr is 59-low 70's. Recent Echo with normal EF. Needs urine micro albumin.   BP Readings from Last 3 Encounters:  10/14/19 (!) 146/78  09/17/19 (!) 195/84  08/31/19 138/79

## 2019-10-17 NOTE — Assessment & Plan Note (Addendum)
CHF: Chronic diastolic CHF with systolic dysfunction /NICM/07/26/19 recently seen by cardiology and medications were changed.  Atypical chest pain thought to be related to hypertensive urgency with medication non adherence.  No further cardiac testing planned.

## 2019-10-17 NOTE — Assessment & Plan Note (Addendum)
ASA 41 mg daily.  Atorvastatin 40 mg daily .  The goal is to try to get diabetes, hyperlipidemia, hypertension, sleep apnea under good control.  He has had no new stroke symptoms.

## 2019-10-17 NOTE — Assessment & Plan Note (Signed)
Non compliant . Address with health insurance in Oct.

## 2019-10-17 NOTE — Assessment & Plan Note (Signed)
Followed by vascular. Support stockings- getting a new measure and plans to start wearing.

## 2019-10-17 NOTE — Assessment & Plan Note (Signed)
On Jardiance 10 mg daily. Hemoglobin A1c 9.4, BMI 52, and had cut back on sweets carbohydrates and following more of a low glycemic index diet. GFR will limit titration of Jardiance. Needs to follow diet and wt loss.

## 2019-10-20 ENCOUNTER — Other Ambulatory Visit: Payer: Self-pay | Admitting: Orthopedic Surgery

## 2019-10-20 DIAGNOSIS — M25511 Pain in right shoulder: Secondary | ICD-10-CM

## 2019-10-21 ENCOUNTER — Other Ambulatory Visit: Payer: Self-pay | Admitting: Orthopedic Surgery

## 2019-10-21 DIAGNOSIS — M25511 Pain in right shoulder: Secondary | ICD-10-CM

## 2019-10-24 ENCOUNTER — Encounter: Payer: Self-pay | Admitting: Emergency Medicine

## 2019-10-24 ENCOUNTER — Emergency Department: Payer: Self-pay

## 2019-10-24 ENCOUNTER — Emergency Department
Admission: EM | Admit: 2019-10-24 | Discharge: 2019-10-24 | Disposition: A | Discharge status: Home / Self Care | Payer: Self-pay | Attending: Emergency Medicine | Admitting: Emergency Medicine

## 2019-10-24 ENCOUNTER — Other Ambulatory Visit: Payer: Self-pay

## 2019-10-24 DIAGNOSIS — R202 Paresthesia of skin: Secondary | ICD-10-CM | POA: Insufficient documentation

## 2019-10-24 DIAGNOSIS — J189 Pneumonia, unspecified organism: Secondary | ICD-10-CM | POA: Insufficient documentation

## 2019-10-24 DIAGNOSIS — Z8673 Personal history of transient ischemic attack (TIA), and cerebral infarction without residual deficits: Secondary | ICD-10-CM | POA: Insufficient documentation

## 2019-10-24 DIAGNOSIS — E1122 Type 2 diabetes mellitus with diabetic chronic kidney disease: Secondary | ICD-10-CM | POA: Insufficient documentation

## 2019-10-24 DIAGNOSIS — I5032 Chronic diastolic (congestive) heart failure: Secondary | ICD-10-CM | POA: Insufficient documentation

## 2019-10-24 DIAGNOSIS — Z7982 Long term (current) use of aspirin: Secondary | ICD-10-CM | POA: Insufficient documentation

## 2019-10-24 DIAGNOSIS — R200 Anesthesia of skin: Secondary | ICD-10-CM | POA: Insufficient documentation

## 2019-10-24 DIAGNOSIS — R0602 Shortness of breath: Secondary | ICD-10-CM | POA: Insufficient documentation

## 2019-10-24 DIAGNOSIS — Z79899 Other long term (current) drug therapy: Secondary | ICD-10-CM | POA: Insufficient documentation

## 2019-10-24 DIAGNOSIS — N183 Chronic kidney disease, stage 3 unspecified: Secondary | ICD-10-CM | POA: Insufficient documentation

## 2019-10-24 DIAGNOSIS — I130 Hypertensive heart and chronic kidney disease with heart failure and stage 1 through stage 4 chronic kidney disease, or unspecified chronic kidney disease: Secondary | ICD-10-CM | POA: Insufficient documentation

## 2019-10-24 DIAGNOSIS — Z7984 Long term (current) use of oral hypoglycemic drugs: Secondary | ICD-10-CM | POA: Insufficient documentation

## 2019-10-24 LAB — BASIC METABOLIC PANEL
Anion gap: 9 (ref 5–15)
BUN: 34 mg/dL — ABNORMAL HIGH (ref 6–20)
CO2: 25 mmol/L (ref 22–32)
Calcium: 8.5 mg/dL — ABNORMAL LOW (ref 8.9–10.3)
Chloride: 102 mmol/L (ref 98–111)
Creatinine, Ser: 1.69 mg/dL — ABNORMAL HIGH (ref 0.61–1.24)
GFR calc Af Amer: 54 mL/min — ABNORMAL LOW (ref >60)
GFR calc non Af Amer: 47 mL/min — ABNORMAL LOW (ref >60)
Glucose, Bld: 347 mg/dL — ABNORMAL HIGH (ref 70–99)
Potassium: 4.9 mmol/L (ref 3.5–5.1)
Sodium: 136 mmol/L (ref 135–145)

## 2019-10-24 LAB — URINALYSIS, COMPLETE (UACMP) WITH MICROSCOPIC
Bacteria, UA: NONE SEEN
Bilirubin Urine: NEGATIVE
Glucose, UA: >=500 mg/dL — AB
Hgb urine dipstick: NEGATIVE
Ketones, ur: NEGATIVE mg/dL
Leukocytes,Ua: NEGATIVE
Nitrite: NEGATIVE
Protein, ur: NEGATIVE mg/dL
Specific Gravity, Urine: 1.027 (ref 1.005–1.030)
pH: 5 (ref 5.0–8.0)

## 2019-10-24 LAB — CBC
HCT: 41.2 % (ref 39.0–52.0)
Hemoglobin: 13.5 g/dL (ref 13.0–17.0)
MCH: 28.5 pg (ref 26.0–34.0)
MCHC: 32.8 g/dL (ref 30.0–36.0)
MCV: 87.1 fL (ref 80.0–100.0)
Platelets: 241 10*3/uL (ref 150–400)
RBC: 4.73 MIL/uL (ref 4.22–5.81)
RDW: 13.9 % (ref 11.5–15.5)
WBC: 8.5 10*3/uL (ref 4.0–10.5)
nRBC: 0 % (ref 0.0–0.2)

## 2019-10-24 LAB — TROPONIN I (HIGH SENSITIVITY)
Troponin I (High Sensitivity): 25 ng/L — ABNORMAL HIGH (ref <18)
Troponin I (High Sensitivity): 25 ng/L — ABNORMAL HIGH (ref <18)

## 2019-10-24 MED ORDER — DOXYCYCLINE HYCLATE 100 MG PO TABS
100.0000 mg | ORAL_TABLET | Freq: Two times a day (BID) | ORAL | 0 refills | Status: DC
Start: 2019-10-24 — End: 2019-12-29

## 2019-10-24 NOTE — ED Triage Notes (Addendum)
Pt in via [facility] from work, reports having episode of shortness of breath w/ associated left arm tingling/numbness, dizziness, generalized weakness prior to arrival.  A/Ox4, vitals WDL, NAD noted at this time.

## 2019-10-24 NOTE — ED Provider Notes (Signed)
[facility] Emergency Department Provider Note   ____________________________________________    I have reviewed the triage vital signs and the nursing notes.   HISTORY  Chief Complaint Shortness of Breath     HPI Jeremiah Vance is a 49 y.o. male with a history of CVA, CKD, CHF, morbid obesity presents today with complaints of cough and mild shortness of breath.  Also reports earlier this morning while at work had numbness and tingling in his left hand which lasted less than half an hour.  Denies chest pain.  No fevers or chills or nausea.  No calf pain or swelling.  Reports prior CVAs manifested with dizziness.  No headache.  No nausea or vomiting or weakness.  Has been coughing for nearly 10 days.  No body aches.  Past Medical History:  Diagnosis Date  . Acute ischemic stroke ([place]) 09/06/2017  . Acute renal failure superimposed on stage 3a chronic kidney disease ([place]) 07/08/2019  . Acute right-sided weakness   . Allergies   . Anasarca   . Anemia 07/10/2017  . Arthritis   . CHF (congestive heart failure) ([place])    "coinsided w/kidney problems I was having 06/2017"  . Chicken pox   . CKD (chronic kidney disease) stage 3, GFR 30-59 ml/min 06/2017  . Depression   . Elevated troponin 07/08/2019  . High cholesterol   . History of cardiomyopathy    LVEF 40 to 45% in April 2019 - subsequently normalized  . Hyperbilirubinemia 07/10/2017  . Hypertension   . Ischemic stroke ([place])    Small left internal capsule infarct due to lacunar disease  . Morbid obesity ([place])   . Normocytic anemia 12/16/2017  . 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 Stroke ([place]) 08/2017   "right sided weakness since; getting stronger though" (10/22/2017)  . Type 2 diabetes mellitus [facility])     Patient Active Problem List   Diagnosis Date Noted  . Lymphedema of both lower extremities 10/17/2019  . History of stroke 10/17/2019  . CKD (chronic  kidney disease) stage 3, GFR 30-59 ml/min 08/31/2019  . Swelling of limb 08/31/2019  . Diabetes mellitus ([place]) 07/08/2019  . Chest pain 07/08/2019  . Vitreous floaters of both eyes 06/03/2018  . Moderate obstructive sleep apnea 02/11/2018  . Chronic fatigue 12/16/2017  . Essential hypertension 12/16/2017  . History of small bowel obstruction 10/24/2017  . Adjustment disorder with depressed mood 09/30/2017  . Stroke ([place]) 09/14/2017  . Acute on chronic diastolic CHF (congestive heart failure) ([place])   . Hyperlipidemia   . Gout   . Elevated LFTs   . (HFpEF) heart failure with preserved ejection fraction ([place])   . Morbid obesity ([place])   . Hypoalbuminemia 07/11/2017  . Type 2 diabetes mellitus with hyperglycemia ([place]) 07/10/2017    Past Surgical History:  Procedure Laterality Date  . ABDOMINAL HERNIA REPAIR  2008; 10/22/2017   "scope; OPEN REPAIR INCARCERATED VENTRAL HERNIA  . HERNIA REPAIR    . KNEE ARTHROSCOPY Right 1989  . VENTRAL HERNIA REPAIR N/A 10/22/2017   Procedure: OPEN REPAIR INCARCERATED VENTRAL HERNIA;  Surgeon: Excell Seltzer, MD;  Location: [place];  Service: General;  Laterality: N/A;    Prior to Admission medications   Medication Sig Start Date End Date Taking? Authorizing Provider  aspirin 81 MG chewable tablet Chew 1 tablet (81 mg total) by mouth daily. 09/20/19   Alisa Graff, FNP  atorvastatin (LIPITOR) 40 MG tablet Take 1 tablet (  40 mg total) by mouth daily at 6 PM. 09/20/19   Alisa Graff, FNP  blood glucose meter kit and supplies Dispense based on patient and insurance preference. Use up to four times daily as directed. (FOR ICD-10 E10.9, E11.9). 08/03/19   [name], NP  carvedilol (COREG) 12.5 MG tablet Take 1 tablet (12.5 mg total) by mouth 2 (two) times daily. 09/20/19 12/19/19  Alisa Graff, FNP  doxycycline (VIBRA-TABS) 100 MG tablet Take 1 tablet (100 mg total) by mouth 2 (two) times daily. 10/24/19   Lavonia Drafts, MD  empagliflozin  (JARDIANCE) 10 MG TABS tablet Take 1 tablet (10 mg total) by mouth every morning. 09/17/19   [name], NP  hydrALAZINE (APRESOLINE) 25 MG tablet Take 1 tablet (25 mg total) by mouth 3 (three) times daily. 09/20/19 12/19/19  Alisa Graff, FNP  torsemide (DEMADEX) 20 MG tablet Take 2 tablets (40 mg total) by mouth daily. 09/20/19   Alisa Graff, FNP     Allergies Pollen extract  Family History  Problem Relation Age of Onset  . Diabetes Father   . Heart disease Father   . Arthritis Father   . Hearing loss Father   . Hyperlipidemia Father   . Heart attack Father   . Hypertension Father   . Stroke Father   . Diabetes Sister   . Diabetes Mother   . Stroke Mother   . Arthritis Mother   . Depression Mother   . Heart disease Mother   . Hypertension Mother   . Learning disabilities Mother   . Mental illness Mother   . Diabetes Maternal Grandmother   . Heart disease Maternal Grandmother   . Depression Maternal Grandmother   . Hyperlipidemia Maternal Grandmother   . Hypertension Maternal Grandmother   . Stroke Maternal Grandmother   . Stroke Paternal Grandfather   . Heart disease Paternal Grandfather   . Arthritis Paternal Grandfather   . Heart attack Paternal Grandfather   . Hypertension Maternal Grandfather   . Hyperlipidemia Maternal Grandfather   . Stroke Maternal Grandfather   . Arthritis Paternal Grandmother   . Hearing loss Paternal Grandmother   . Depression Sister   . Diabetes Sister   . Hypertension Sister   . Mental illness Sister     Social History Social History   Tobacco Use  . Smoking status: Never Smoker  . Smokeless tobacco: Never Used  Vaping Use  . Vaping Use: Never used  Substance Use Topics  . Alcohol use: Not Currently  . Drug use: Never    Review of Systems  Constitutional: As above Eyes: No visual changes.  ENT: No sore throat. Cardiovascular: As above Respiratory: As above Gastrointestinal: No abdominal pain.  No nausea, no  vomiting.   Genitourinary: Negative for dysuria. Musculoskeletal: Negative for back pain. Skin: Negative for rash. Neurological: As above   ____________________________________________   PHYSICAL EXAM:  VITAL SIGNS: ED Triage Vitals  Enc Vitals Group     BP 10/24/19 0850 (!) 155/78     Pulse Rate 10/24/19 0850 60     Resp 10/24/19 0850 15     Temp 10/24/19 0850 98 F (36.7 C)     Temp Source 10/24/19 0850 Oral     SpO2 10/24/19 0850 96 %     Weight 10/24/19 0845 (!) 167.8 kg (370 lb)     Height 10/24/19 0845 1.778 m ('5\' 10"' )     Head Circumference --      Peak  Flow --      Pain Score 10/24/19 0845 0     Pain Loc --      Pain Edu? --      Excl. in [place]? --     Constitutional: Alert and oriented.  Eyes: Conjunctivae are normal.   Nose: No congestion/rhinnorhea. Mouth/Throat: Mucous membranes are moist.    Cardiovascular: Normal rate, regular rhythm. Grossly normal heart sounds.  Good peripheral circulation. Respiratory: Normal respiratory effort.  No retractions. Lungs CTAB. Gastrointestinal: Soft and nontender. No distention. Musculoskeletal: No lower extremity tenderness nor edema.  Warm and well perfused Neurologic:  Normal speech and language. No gross focal neurologic deficits are appreciated.  Skin:  Skin is warm, dry and intact. No rash noted. Psychiatric: Mood and affect are normal. Speech and behavior are normal.  ____________________________________________   LABS (all labs ordered are listed, but only abnormal results are displayed)  Labs Reviewed  BASIC METABOLIC PANEL - Abnormal; Notable for the following components:      Result Value   Glucose, Bld 347 (*)    BUN 34 (*)    Creatinine, Ser 1.69 (*)    Calcium 8.5 (*)    GFR calc non Af Amer 47 (*)    GFR calc Af Amer 54 (*)    All other components within normal limits  URINALYSIS, COMPLETE (UACMP) WITH MICROSCOPIC - Abnormal; Notable for the following components:   Color, Urine STRAW (*)     APPearance CLEAR (*)    Glucose, UA >=500 (*)    All other components within normal limits  TROPONIN I (HIGH SENSITIVITY) - Abnormal; Notable for the following components:   Troponin I (High Sensitivity) 25 (*)    All other components within normal limits  TROPONIN I (HIGH SENSITIVITY) - Abnormal; Notable for the following components:   Troponin I (High Sensitivity) 25 (*)    All other components within normal limits  CBC   ____________________________________________  EKG  ED ECG REPORT I, Lavonia Drafts, the attending physician, personally viewed and interpreted this ECG.  Date: 10/24/2019  Rhythm: normal sinus rhythm QRS Axis: normal Intervals: normal ST/T Wave abnormalities: non specific abnormality Narrative Interpretation: no evidence of acute ischemia  ____________________________________________  RADIOLOGY  Chest x-ray reviewed by me, possible infiltrate right lung ____________________________________________   PROCEDURES  Procedure(s) performed: No  Procedures   Critical Care performed: No ____________________________________________   INITIAL IMPRESSION / ASSESSMENT AND PLAN / ED COURSE  Pertinent labs & imaging results that were available during my care of the patient were reviewed by me and considered in my medical decision making (see chart for details).  Patient presents with multiple complaints as detailed above.  Differential for cough includes pneumonia, bronchitis, viral illness  Differential for hand numbness includes CVA, radiculopathy, anxiety  CT head reassuring, normal neurologic exam.  Chest x-ray concerning for possible pneumonia however lab work is overall reassuring.  Offered admission but patient would prefer discharge with p.o. antibiotics    ____________________________________________   FINAL CLINICAL IMPRESSION(S) / ED DIAGNOSES  Final diagnoses:  Community acquired pneumonia of right lower lobe of lung         Note:  This document was prepared using Dragon voice recognition software and may include unintentional dictation errors.   Lavonia Drafts, MD 10/24/19 [phone]

## 2019-10-24 NOTE — ED Notes (Signed)
Pt presents to the ED for SOB, weakness, and numbness on his L side since this AM. Pt states she also been feeling short of breath and having a dry cough for months. Pt is A&Ox4 and NAD.

## 2019-11-08 ENCOUNTER — Other Ambulatory Visit: Payer: Self-pay

## 2019-11-08 ENCOUNTER — Ambulatory Visit
Admission: RE | Admit: 2019-11-08 | Discharge: 2019-11-08 | Disposition: A | Discharge status: Home / Self Care | Payer: Self-pay | Source: Ambulatory Visit | Attending: Orthopedic Surgery | Admitting: Orthopedic Surgery

## 2019-11-08 ENCOUNTER — Ambulatory Visit
Admission: RE | Admit: 2019-11-08 | Discharge: 2019-11-08 | Disposition: A | Discharge status: Home / Self Care | Payer: Medicaid Other | Source: Ambulatory Visit | Attending: Orthopedic Surgery | Admitting: Orthopedic Surgery

## 2019-11-08 DIAGNOSIS — M75101 Unspecified rotator cuff tear or rupture of right shoulder, not specified as traumatic: Secondary | ICD-10-CM | POA: Insufficient documentation

## 2019-11-08 DIAGNOSIS — M67813 Other specified disorders of tendon, right shoulder: Secondary | ICD-10-CM | POA: Insufficient documentation

## 2019-11-08 DIAGNOSIS — M25511 Pain in right shoulder: Secondary | ICD-10-CM

## 2019-11-08 MED ORDER — SODIUM CHLORIDE (PF) 0.9 % IJ SOLN
10.0000 mL | Freq: Once | INTRAMUSCULAR | Status: AC
  Administered 2019-11-08: 10 mL

## 2019-11-08 MED ORDER — IOHEXOL 180 MG/ML  SOLN
15.0000 mL | Freq: Once | INTRAMUSCULAR | Status: AC
  Administered 2019-11-08: 15 mL

## 2019-11-08 MED ORDER — GADOBUTROL 1 MMOL/ML IV SOLN
0.0500 mL | Freq: Once | INTRAVENOUS | Status: AC | PRN
  Administered 2019-11-08: 0.05 mL

## 2019-11-08 MED ORDER — LIDOCAINE HCL (PF) 1 % IJ SOLN
5.0000 mL | Freq: Once | INTRAMUSCULAR | Status: AC
  Administered 2019-11-08: 5 mL
  Filled 2019-11-08: qty 5

## 2019-11-12 ENCOUNTER — Telehealth: Payer: Self-pay | Admitting: Nurse Practitioner

## 2019-11-12 NOTE — Telephone Encounter (Signed)
° °  SF 11/12/2019    Name: Jeremiah Vance    MRN: [id number]    DOB: 07/06/1970    AGE: 49 y.o.    GENDER: male    PCP Theadore Nan, NP.   Spoke with patient regarding transportation assistance. Patient stated that he does not need transportation assistance at this time. He stated that he uses [employer] whenever he has to travel to a doctors appointment. Patient stated no additional needs at this time.    Closing referral pending any other needs of patient.    [facility] Care Guide, Embedded Care Coordination [facility], Care Management Phone: [phone] Email: [email or web address]

## 2019-11-25 ENCOUNTER — Telehealth: Payer: Self-pay | Admitting: Pharmacist

## 2019-11-25 NOTE — Telephone Encounter (Signed)
Patient failed to provide requested 2021 financial documentation. No additional medication assistance will be provided by [facility] without the required proof of income documentation. Patient notified by letter Debra Cheek Administrative Assistant [facility] 

## 2019-11-26 ENCOUNTER — Ambulatory Visit: Payer: Medicaid Other | Admitting: Family

## 2019-11-27 NOTE — Progress Notes (Deleted)
Patient ID: Jeremiah Vance, male    DOB: [birth year], 49 y.o.   MRN: [id number]  HPI  Jeremiah Vance is a 49 y/o male with a history of DM, hyperlipidemia, HTN, CKD, stroke, morbid obesity and chronic heart failure.   Echo report from 07/09/19 reviewed and showed an EF of 50-55% along with mild LVH.  Was in the ED 10/24/19 due to cough and shortness of breath. CXR showed pneumonia. Given antibiotics and he was released. Admitted 07/08/19 due to atypical chest pain. Cardiology consult obtained. Medications were restarted. Discharged the following day.   He presents today for a follow-up visit with a chief complaint of   Past Medical History:  Diagnosis Date  . Acute ischemic stroke (H[facility]) 09/06/2017  . Acute renal failure superimposed on stage 3a chronic kidney disease ([facility]) 07/08/2019  . Acute right-sided weakness   . Allergies   . Anasarca   . Anemia 07/10/2017  . Arthritis   . CHF (congestive heart failure) ([facility])    "coinsided w/kidney problems I was having 06/2017"  . Chicken pox   . CKD (chronic kidney disease) stage 3, GFR 30-59 ml/min 06/2017  . Depression   . Elevated troponin 07/08/2019  . High cholesterol   . History of cardiomyopathy    LVEF 40 to 45% in April 2019 - subsequently normalized  . Hyperbilirubinemia 07/10/2017  . Hypertension   . Ischemic stroke ([facility])    Small left internal capsule infarct due to lacunar disease  . Morbid obesity ([facility])   . Normocytic anemia 12/16/2017  . 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 Stroke ([facility]) 08/2017   "right sided weakness since; getting stronger though" (10/22/2017)  . Type 2 diabetes mellitus ([facility])    Past Surgical History:  Procedure Laterality Date  . ABDOMINAL HERNIA REPAIR  2008; 10/22/2017   "scope; OPEN REPAIR INCARCERATED VENTRAL HERNIA  . HERNIA REPAIR    . KNEE ARTHROSCOPY Right 1989  . VENTRAL HERNIA REPAIR N/A 10/22/2017   Procedure: OPEN REPAIR INCARCERATED VENTRAL HERNIA;  Surgeon:  Jeremiah Fellows, MD;  [facility];  Service: General;  Laterality: N/A;   Family History  Problem Relation Age of Onset  . Diabetes Father   . Heart disease Father   . Arthritis Father   . Hearing loss Father   . Hyperlipidemia Father   . Heart attack Father   . Hypertension Father   . Stroke Father   . Diabetes Sister   . Diabetes Mother   . Stroke Mother   . Arthritis Mother   . Depression Mother   . Heart disease Mother   . Hypertension Mother   . Learning disabilities Mother   . Mental illness Mother   . Diabetes Maternal Grandmother   . Heart disease Maternal Grandmother   . Depression Maternal Grandmother   . Hyperlipidemia Maternal Grandmother   . Hypertension Maternal Grandmother   . Stroke Maternal Grandmother   . Stroke Paternal Grandfather   . Heart disease Paternal Grandfather   . Arthritis Paternal Grandfather   . Heart attack Paternal Grandfather   . Hypertension Maternal Grandfather   . Hyperlipidemia Maternal Grandfather   . Stroke Maternal Grandfather   . Arthritis Paternal Grandmother   . Hearing loss Paternal Grandmother   . Depression Sister   . Diabetes Sister   . Hypertension Sister   . Mental illness Sister    Social History   Tobacco Use  . Smoking status: Never Smoker  .  Smokeless tobacco: Never Used  Substance Use Topics  . Alcohol use: Not Currently   Allergies  Allergen Reactions  . Pollen Extract Other (See Comments)      Review of Systems  Constitutional: Positive for fatigue (minimal). Negative for appetite change.  HENT: Negative for congestion, postnasal drip and sore throat.   Eyes: Negative.   Respiratory: Positive for cough (dry ). Negative for shortness of breath.   Cardiovascular: Negative for chest pain, palpitations and leg swelling.  Gastrointestinal: Negative for abdominal distention and abdominal pain.  Endocrine: Negative.   Genitourinary: Negative.   Musculoskeletal: Negative for back pain and neck  pain.  Skin: Negative.   Allergic/Immunologic: Negative.   Neurological: Negative for dizziness and light-headedness.  Hematological: Negative for adenopathy. Does not bruise/bleed easily.  Psychiatric/Behavioral: Negative for dysphoric mood and sleep disturbance (sleeping on 2 pillows). The patient is not nervous/anxious.     Physical Exam Vitals and nursing note reviewed.  Constitutional:      Appearance: Normal appearance.  HENT:     Head: Normocephalic and atraumatic.  Cardiovascular:     Rate and Rhythm: Normal rate and regular rhythm.  Pulmonary:     Effort: Pulmonary effort is normal. No respiratory distress.     Breath sounds: No wheezing or rales.  Abdominal:     General: There is no distension.     Palpations: Abdomen is soft.  Musculoskeletal:        General: No tenderness.     Cervical back: Normal range of motion and neck supple.     Right lower leg: Edema (1+ pitting) present.     Left lower leg: Edema (1+ pitting) present.  Skin:    General: Skin is warm and dry.  Neurological:     General: No focal deficit present.     Mental Status: He is alert and oriented to person, place, and time.  Psychiatric:        Mood and Affect: Mood normal.        Behavior: Behavior normal.        Thought Content: Thought content normal.    Assessment & Plan:  1: Chronic heart failure with preserved ejection fraction with structural changes- - NYHA class II - euvolemic today - weighing daily; reminded to call for an overnight weight gain of >2 pounds or a weekly weight gain of >5 pounds - weight 378.4 pounds from last visit here 3 months - not adding salt to his food and is trying to read labels for sodium content; reviewed the importance of closely following a 2000mg  sodium diet - saw cardiology (Jeremiah Vance) 08/27/19 - BNP 07/08/19 was 219.0  2: HTN- - BP  - saw PCP 09/07/19) 10/14/19 - BMP 10/24/19 reviewed and showed sodium 136, potassium 4.9, creatinine 1.69 and GFR  47  3: DM- - A1c 10/14/19 was 10.9% - nonfasting glucose in clinic today was   4: Lymphedema- - stage 2 - elevating his legs when sitting for long periods of time at home; works at 10/16/19 so is standing for long periods of time while at work - saw vascular Jeremiah Vance) 09/17/19 - consider lymphapress compression boots if edema persists.   Patient did not bring his medications nor a list. Each medication was verbally reviewed with the patient and he was encouraged to bring the bottles to every visit to confirm accuracy of list.

## 2019-11-29 ENCOUNTER — Telehealth: Payer: Self-pay | Admitting: Family

## 2019-11-29 ENCOUNTER — Telehealth: Payer: Medicaid Other

## 2019-11-29 ENCOUNTER — Ambulatory Visit: Payer: Medicaid Other | Admitting: Family

## 2019-11-29 ENCOUNTER — Telehealth: Payer: Self-pay | Admitting: Pharmacist

## 2019-11-29 NOTE — Telephone Encounter (Signed)
°  Chronic Care Management   Note  11/29/2019 Name: Jeremiah Vance MRN: [id number] DOB: [birth year]   Attempted to contact patient for scheduled appointment for medication management support. Left HIPAA compliant message for patient to return my call at their convenience.    Plan: - If I do not hear back from the patient by end of business today, will collaborate with Care Guide to outreach to schedule follow up with me   Catie Feliz Beam, PharmD, [place], CPP Clinical Pharmacist [facility] [employer] [phone]

## 2019-11-29 NOTE — Telephone Encounter (Signed)
Patient did not show for his Heart Failure Clinic appointment on 11/29/19. Will attempt to reschedule.

## 2019-12-01 NOTE — Telephone Encounter (Signed)
Pt has been r/s for 12/29/2019 °

## 2019-12-29 ENCOUNTER — Ambulatory Visit: Payer: Medicaid Other | Admitting: Pharmacist

## 2019-12-29 ENCOUNTER — Telehealth: Payer: Self-pay | Admitting: Pharmacist

## 2019-12-29 DIAGNOSIS — I5030 Unspecified diastolic (congestive) heart failure: Secondary | ICD-10-CM

## 2019-12-29 DIAGNOSIS — E1165 Type 2 diabetes mellitus with hyperglycemia: Secondary | ICD-10-CM

## 2019-12-29 NOTE — Patient Instructions (Signed)
Mr. Stuard,   It was great talking with you today!  Our goal A1c is less than 7%. This corresponds with fasting sugars less than 130 and 2 hour after meal sugars less than 180. Please check your blood sugar twice daily - fasting and 2 hour after meals.   Moving forward, we can increase the Jardiance to the maximum dose of 25 mg daily if needed. We also talked today about a class of medications that helps with weight loss- medications like Ozempic, Victoza, or Rybelsus.   Our goal blood pressure is less than 130/80. Please check your blood pressure periodically at work.  Feel free to call me with any questions or concerns. I look forward to our next visit!  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   Polypharmacy; complex patient with multiple comorbidities including DM, CHF, hx CVA, depression  Financial concerns: only has [employer], no prescription drug coverage. Working with [facility] on long-term support for medication access. Application for Jardiance submitted per patient report.   Most recent eGFR: ~49 o CHF (last EF 50-55%); now established w/ CHF clinic and [facility] ([place]); torsemide 40 mg daily, carvedilol 12.5 mg BID, hydralazine 25 mg TID - Occasionally uses BP machine at [facility], but has not checked in a few weeks.  - Home weights: 363 lbs most recent.  o T2DM: last A1c 10.9%. Jardiance 10 mg (through samples. Notes he is almost out) - Reports hx metformin, but was d/c "because it made me  sick". Notes he is not comfortable with injectable therapy at this time. - Last night 140 before supper; has not been checking fastings. Keeps his glucometer at work, so was unable to review readings with me on the phone. Upcoming PCP appt - Exercise: plans on joining [employer] with his family and going regularly.  o Hx ASCVD: ASA 81 mg daily, atorvastatin 40 mg daily o Vascular disease, edema: established w/ [facility]  Pharmacist Clinical Goal(s):   Over the next 90 days, patient will work with PharmD and provider towards optimized medication management  Interventions:  Comprehensive medication review performed; medication list updated in electronic medical record  Inter-disciplinary care team collaboration (see longitudinal plan of care)  Will prepare sample of Jardiance 10 mg daily.  PCP visit in ~2 weeks, due for A1c. Likely will need to increase Jardiance to 25 mg daily.   We also discussed GLP1 to aid in goal of weight loss. If A1c still significantly elevated, recommend adding Ozempic or Rybelsus (if patient completely averse to injections) at next visit. Can collaborate w/ [facility] on pursuing patient assistance.   Encouraged to check BP at work periodically to ensure maintaining goal.   Patient Self Care Activities:   Patient will take medications as prescribed  Please see past updates related to this goal by clicking on the "Past Updates" button in the selected goal         Print copy of patient instructions provided.  Plan:  - Scheduled f/u call in ~6 weeks (~4 weeks after PCP visit)  Catie Darnelle Maffucci, PharmD, [place], [place] Pharmacist [place] [phone]

## 2019-12-29 NOTE — Progress Notes (Signed)
Medication Samples have been logged, labeled, and placed at the front desk for the patient to pick up  Drug name: Jardiance       Strength: 10 mg       Qty:  2 boxes EXP Date: 10/2021  LOT [id number]

## 2019-12-29 NOTE — Chronic Care Management (AMB) (Signed)
Chronic Care Management   Follow Up Note   12/29/2019 Name: JERRY CLYNE MRN: [id number] DOB: 08/09/1970  Referred by: [name], NP Reason for referral : Chronic Care Management (Medication Management)   Labarron D Barco is a 49 y.o. year old male who is a primary care patient of [name], NP. The CCM team was consulted for assistance with chronic disease management and care coordination needs.    Contacted patient for medication management review.  Review of patient status, including review of consultants reports, relevant laboratory and other test results, and collaboration with appropriate care team members and the patient's provider was performed as part of comprehensive patient evaluation and provision of chronic care management services.    SDOH (Social Determinants of Health) assessments performed: Yes See Care Plan activities for detailed interventions related to [facility])     Outpatient Encounter Medications as of 12/29/2019  Medication Sig  . aspirin 81 MG chewable tablet Chew 1 tablet (81 mg total) by mouth daily.  [name] Kitchen atorvastatin (LIPITOR) 40 MG tablet Take 1 tablet (40 mg total) by mouth daily at 6 PM.  . blood glucose meter kit and supplies Dispense based on patient and insurance preference. Use up to four times daily as directed. (FOR ICD-10 E10.9, E11.9).  . carvedilol (COREG) 12.5 MG tablet Take 1 tablet (12.5 mg total) by mouth 2 (two) times daily.  [name] Kitchen torsemide (DEMADEX) 20 MG tablet Take 2 tablets (40 mg total) by mouth daily.  . empagliflozin (JARDIANCE) 10 MG TABS tablet Take 1 tablet (10 mg total) by mouth every morning. (Patient not taking: Reported on 12/29/2019)  . hydrALAZINE (APRESOLINE) 25 MG tablet Take 1 tablet (25 mg total) by mouth 3 (three) times daily.  . [DISCONTINUED] doxycycline (VIBRA-TABS) 100 MG tablet Take 1 tablet (100 mg total) by mouth 2 (two) times daily.   No facility-administered encounter medications on file as of 12/29/2019.      Objective:   Goals Addressed              This Visit's Progress     Patient Stated   .  PharmD "I can't afford my medications" (pt-stated)        CARE PLAN ENTRY (see longtitudinal plan of care for additional care plan information)  Current Barriers:  . 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  . Polypharmacy; complex patient with multiple comorbidities including DM, CHF, hx CVA, depression . Financial concerns: only has [employer], no prescription drug coverage. Working [facility] on long-term support for medication access. Application for Jardiance submitted per patient report.  . Most recent eGFR: ~49 o CHF (last EF 50-55%); now established w/ [facility]F clinic and [facility] ([facility]); torsemide 40 mg daily, carvedilol 12.5 mg BID, hydralazine 25 mg TID - Occasionally uses BP machine at [facility], but has not checked in a few weeks.  - Home weights: 363 lbs most recent.  o T2DM: last A1c 10.9%. Jardiance 10 mg (through samples. Notes he is almost out) - Reports hx metformin, but was d/c "because it made me sick". Notes he is not comfortable with injectable therapy at this time. - Last night 140 before supper; has not been checking fastings. Keeps his glucometer at work, so was unable to review readings with me on the phone. Upcoming PCP appt - Exercise: plans on joining [employer] with his family and going regularly.  o Hx ASCVD: ASA 81 mg daily, atorvastatin 40 mg daily o Vascular disease, edema: established w/ [facility]  Pharmacist Clinical Goal(s):  [name] Kitchen Over the next 90 days, patient will work with PharmD and provider towards optimized medication management  Interventions: . Comprehensive medication review performed; medication list updated in electronic medical record . Inter-disciplinary care team collaboration (see longitudinal plan of  care) . Will prepare sample of Jardiance 10 mg daily. [name] Kitchen PCP visit in ~2 weeks, due for A1c. Likely will need to increase Jardiance to 25 mg daily.  . We also discussed GLP1 to aid in goal of weight loss. If A1c still significantly elevated, recommend adding Ozempic or Rybelsus (if patient completely averse to injections) at next visit. Can collaborate w/ [facility] on pursuing patient assistance.  . Encouraged to check BP at work periodically to ensure maintaining goal.   Patient Self Care Activities:  . Patient will take medications as prescribed  Please see past updates related to this goal by clicking on the "Past Updates" button in the selected goal          Plan:  - Scheduled f/u call in ~6 weeks (~4 weeks after PCP visit)  Catie Darnelle Maffucci, PharmD, [place], [place] Pharmacist [place] [phone]

## 2020-01-14 ENCOUNTER — Ambulatory Visit: Payer: Medicaid Other | Admitting: Nurse Practitioner

## 2020-01-18 ENCOUNTER — Encounter: Payer: Self-pay | Admitting: Nurse Practitioner

## 2020-01-18 ENCOUNTER — Ambulatory Visit: Payer: Self-pay | Admitting: Pharmacist

## 2020-01-18 ENCOUNTER — Ambulatory Visit (INDEPENDENT_AMBULATORY_CARE_PROVIDER_SITE_OTHER): Payer: Self-pay | Admitting: Nurse Practitioner

## 2020-01-18 ENCOUNTER — Other Ambulatory Visit: Payer: Self-pay

## 2020-01-18 DIAGNOSIS — Z125 Encounter for screening for malignant neoplasm of prostate: Secondary | ICD-10-CM

## 2020-01-18 DIAGNOSIS — Z23 Encounter for immunization: Secondary | ICD-10-CM

## 2020-01-18 DIAGNOSIS — E1122 Type 2 diabetes mellitus with diabetic chronic kidney disease: Secondary | ICD-10-CM

## 2020-01-18 DIAGNOSIS — I5030 Unspecified diastolic (congestive) heart failure: Secondary | ICD-10-CM

## 2020-01-18 DIAGNOSIS — E1165 Type 2 diabetes mellitus with hyperglycemia: Secondary | ICD-10-CM

## 2020-01-18 DIAGNOSIS — N1831 Chronic kidney disease, stage 3a: Secondary | ICD-10-CM

## 2020-01-18 DIAGNOSIS — E785 Hyperlipidemia, unspecified: Secondary | ICD-10-CM

## 2020-01-18 DIAGNOSIS — R7989 Other specified abnormal findings of blood chemistry: Secondary | ICD-10-CM

## 2020-01-18 LAB — COMPREHENSIVE METABOLIC PANEL
ALT: 16 U/L (ref 0–53)
AST: 14 U/L (ref 0–37)
Albumin: 3.4 g/dL — ABNORMAL LOW (ref 3.5–5.2)
Alkaline Phosphatase: 87 U/L (ref 39–117)
BUN: 15 mg/dL (ref 6–23)
CO2: 31 mEq/L (ref 19–32)
Calcium: 8.4 mg/dL (ref 8.4–10.5)
Chloride: 100 mEq/L (ref 96–112)
Creatinine, Ser: 1.28 mg/dL (ref 0.40–1.50)
GFR: 64.78 mL/min (ref >60.00)
Glucose, Bld: 225 mg/dL — ABNORMAL HIGH (ref 70–99)
Potassium: 4.3 mEq/L (ref 3.5–5.1)
Sodium: 136 mEq/L (ref 135–145)
Total Bilirubin: 0.9 mg/dL (ref 0.2–1.2)
Total Protein: 6.5 g/dL (ref 6.0–8.3)

## 2020-01-18 LAB — MICROALBUMIN / CREATININE URINE RATIO
Creatinine,U: 203.7 mg/dL
Microalb Creat Ratio: 42.2 mg/g — ABNORMAL HIGH (ref 0.0–30.0)
Microalb, Ur: 86 mg/dL — ABNORMAL HIGH (ref 0.0–1.9)

## 2020-01-18 LAB — LIPID PANEL
Cholesterol: 138 mg/dL (ref 0–200)
HDL: 35.8 mg/dL — ABNORMAL LOW (ref >39.00)
LDL Cholesterol: 85 mg/dL (ref 0–99)
NonHDL: 102.17
Total CHOL/HDL Ratio: 4
Triglycerides: 88 mg/dL (ref 0.0–149.0)
VLDL: 17.6 mg/dL (ref 0.0–40.0)

## 2020-01-18 LAB — HEMOGLOBIN A1C: Hgb A1c MFr Bld: 12.5 % — ABNORMAL HIGH (ref 4.6–6.5)

## 2020-01-18 LAB — PSA: PSA: 0.26 ng/mL (ref 0.10–4.00)

## 2020-01-18 MED ORDER — OZEMPIC (0.25 OR 0.5 MG/DOSE) 2 MG/1.5ML ~~LOC~~ SOPN: PEN_INJECTOR | SUBCUTANEOUS | 3 refills | Status: DC

## 2020-01-18 NOTE — Patient Instructions (Addendum)
Please go to the lab today.   We have started a new Medicine OZEMPIC which is a one time a week injection into the skin.  Week 1-4- Inject .25 mg into the skin one time a week.  Week 5- inject .50 mg into the skin one time a week.   Please check blood sugar fasting and 2 hour after a meal and record. Bring this in to the visit.   Catie will call for a follow up .   Please call the eye doctor for an  eye exam   Follow up office visit in 1 month .

## 2020-01-18 NOTE — Patient Instructions (Signed)
Visit Information  Goals Addressed              This Visit's Progress     Patient Stated     PharmD "I can't afford my medications" (pt-stated)        CARE PLAN ENTRY (see longtitudinal plan of care for additional care plan information)  Current Barriers:   Social, community, and financial barriers: o Transportation limited by when his sister can transport places.   Polypharmacy; complex patient with multiple comorbidities including DM, CHF, hx CVA, depression  Financial concerns: only has [employer], no prescription drug coverage. Working with [facility] on long-term support for medication access.   Most recent eGFR: ~49 o CHF (last EF 50-55%); now established w/ [facility]F clinic and [facility]); torsemide 40 mg daily, carvedilol 12.5 mg BID, hydralazine 25 mg TID o T2DM: last A1c 10.9%. Jardiance 10 mg; due for updated A1c today - Reports hx metformin, but was d/c "because it made me sick".  o Hx ASCVD: ASA 81 mg daily, atorvastatin 40 mg daily o Vascular disease, edema: established w/ [facility]  Pharmacist Clinical Goal(s):   Over the next 90 days, patient will work with PharmD and provider towards optimized medication management  Interventions:  Comprehensive medication review performed; medication list updated in electronic medical record  Inter-disciplinary care team collaboration (see longitudinal plan of care)  Discussed GLP1, PCP in agreement. Recommend starting Ozempic. Provided counseling and administration education. Counseled on side effects. Provided with sample until [facility] can obtain patient assistance approval.   Patient Self Care Activities:   Patient will take medications as prescribed  Please see past updates related to this goal by clicking on the "Past Updates" button in the selected goal         The patient verbalized understanding of instructions provided today and declined a print copy  of patient instruction materials.   Plan:  - Will outreach next month as previously scheduled  Catie Darnelle Maffucci, PharmD, [place], [place] Pharmacist [facility] [phone]

## 2020-01-18 NOTE — Progress Notes (Signed)
Established Patient Office Visit  Subjective:  Patient ID: Jeremiah Vance, male    DOB: 11-17-1970  Age: 49 y.o. MRN: [id number]  CC:  Chief Complaint  Patient presents with  . Follow-up    diabetes    HPI Jeremiah Vance presents for 3 month follow up with history of HTN, HLD,DM2,CVA, CKD3,CHF, morbid obesity, OSA, chronic right shoulder pain. He knows he needs better insurance. It is open enrollment now and his sister is searching for a better plan for him. Working with [facility] for long-term support.   DM: A1c 10.9. Not at goal. He is on samples of Jardiance 10 mg every morning. Catie is working with him on getting medication covered. He has been working on eating a good breakfast. He forgot to bring in his BS. He left them by the door and walked out without them.  He does not remember any of his readings.  He did get a gym membership and plans to start going with his sister. They plan to keep each other motivated. He saw the Podiatrist and plans to get new shoes. Has not yet schedule eye doctor appt. Legally blind in left eye- [facility] in [place]- had wanted to do surgery and not covered under his Medicaid plan.  CHF: Last EF 50-55%. Followed by Cardiology and CHF clinic. Taking torsemide 40 mg daily, carvedilol 12.5 mg BID, hydralazine 25 mg TID and ASA 81 mg daily. No CP or SOB. No problems with the medication.  HTN: Not at goal. BP Readings from Last 3 Encounters:  01/18/20 (!) 152/90  10/24/19 (!) 174/91  10/14/19 (!) 146/78   Lower extremity lymphedema: Followed by Vascular Surgery: Taking torsemide 20 mg x 2 tabs daily. Wearing support stockings that he purchased special order at [employer]. He is wearing them during the day- most days and they make his legs feel better. He reports no skin ulcers or serous drainage.    HLD: Adequate goal will monitor. Maintained atorvastatin 40 mg daily-no myalgias. LDL 71  Rt Shoulder pain: followed by Ortho- Dr.  Donney Rankins . He had therapy- still hurts.   MRI shoulder 11/08/19 IMPRESSION: 1. Mild tendinosis of the supraspinatus tendon with a tiny insertional interstitial tear. 2. Mild tendinosis of the infraspinatus tendon.   Past Medical History:  Diagnosis Date  . Acute ischemic stroke ([place]) 09/06/2017  . Acute renal failure superimposed on stage 3a chronic kidney disease ([place]) 07/08/2019  . Acute right-sided weakness   . Allergies   . Anasarca   . Anemia 07/10/2017  . Arthritis   . CHF (congestive heart failure) ([place])    "coinsided w/kidney problems I was having 06/2017"  . Chicken pox   . CKD (chronic kidney disease) stage 3, GFR 30-59 ml/min (HCC) 06/2017  . Depression   . Elevated troponin 07/08/2019  . High cholesterol   . History of cardiomyopathy    LVEF 40 to 45% in April 2019 - subsequently normalized  . Hyperbilirubinemia 07/10/2017  . Hypertension   . Ischemic stroke ([place])    Small left internal capsule infarct due to lacunar disease  . Morbid obesity ([place])   . Normocytic anemia 12/16/2017  . 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 Stroke ([place]) 08/2017   "right sided weakness since; getting stronger though" (10/22/2017)  . Type 2 diabetes mellitus ([place])     Past Surgical History:  Procedure Laterality Date  . ABDOMINAL HERNIA REPAIR  2008; 10/22/2017   "scope; OPEN  REPAIR INCARCERATED VENTRAL HERNIA  . HERNIA REPAIR    . KNEE ARTHROSCOPY Right 1989  . VENTRAL HERNIA REPAIR N/A 10/22/2017   Procedure: OPEN REPAIR INCARCERATED VENTRAL HERNIA;  Surgeon: Excell Seltzer, MD;  Location: [facility];  Service: General;  Laterality: N/A;    Family History  Problem Relation Age of Onset  . Diabetes Father   . Heart disease Father   . Arthritis Father   . Hearing loss Father   . Hyperlipidemia Father   . Heart attack Father   . Hypertension Father   . Stroke Father   . Diabetes Sister   . Diabetes Mother   . Stroke Mother   . Arthritis  Mother   . Depression Mother   . Heart disease Mother   . Hypertension Mother   . Learning disabilities Mother   . Mental illness Mother   . Diabetes Maternal Grandmother   . Heart disease Maternal Grandmother   . Depression Maternal Grandmother   . Hyperlipidemia Maternal Grandmother   . Hypertension Maternal Grandmother   . Stroke Maternal Grandmother   . Stroke Paternal Grandfather   . Heart disease Paternal Grandfather   . Arthritis Paternal Grandfather   . Heart attack Paternal Grandfather   . Hypertension Maternal Grandfather   . Hyperlipidemia Maternal Grandfather   . Stroke Maternal Grandfather   . Arthritis Paternal Grandmother   . Hearing loss Paternal Grandmother   . Depression Sister   . Diabetes Sister   . Hypertension Sister   . Mental illness Sister     Social History   Socioeconomic History  . Marital status: Legally Separated    Spouse name: Not on file  . Number of children: Not on file  . Years of education: Not on file  . Highest education level: Not on file  Occupational History  . Not on file  Tobacco Use  . Smoking status: Never Smoker  . Smokeless tobacco: Never Used  Vaping Use  . Vaping Use: Never used  Substance and Sexual Activity  . Alcohol use: Not Currently  . Drug use: Never  . Sexual activity: Not Currently  Other Topics Concern  . Not on file  Social History Narrative   He lives with his sister , [name] and she is his MPOA after his stokes   Social Determinants of Health   Financial Resource Strain: High Risk  . Difficulty of Paying Living Expenses: Hard  Food Insecurity:   . Worried About Charity fundraiser in the Last Year: Not on file  . Ran Out of Food in the Last Year: Not on file  Transportation Needs: Unmet Transportation Needs  . Lack of Transportation (Medical): Yes  . Lack of Transportation (Non-Medical): Yes  Physical Activity:   . Days of Exercise per Week: Not on file  . Minutes of Exercise per Session:  Not on file  Stress:   . Feeling of Stress : Not on file  Social Connections:   . Frequency of Communication with Friends and Family: Not on file  . Frequency of Social Gatherings with Friends and Family: Not on file  . Attends Religious Services: Not on file  . Active Member of Clubs or Organizations: Not on file  . Attends Archivist Meetings: Not on file  . Marital Status: Not on file  Intimate Partner Violence:   . Fear of Current or Ex-Partner: Not on file  . Emotionally Abused: Not on file  . Physically Abused: Not on file  .  Sexually Abused: Not on file    Outpatient Medications Prior to Visit  Medication Sig Dispense Refill  . aspirin 81 MG chewable tablet Chew 1 tablet (81 mg total) by mouth daily. 90 tablet 3  . atorvastatin (LIPITOR) 40 MG tablet Take 1 tablet (40 mg total) by mouth daily at 6 PM. 90 tablet 3  . blood glucose meter kit and supplies Dispense based on patient and insurance preference. Use up to four times daily as directed. (FOR ICD-10 E10.9, E11.9). 1 each 0  . empagliflozin (JARDIANCE) 10 MG TABS tablet Take 1 tablet (10 mg total) by mouth every morning. 90 tablet 1  . torsemide (DEMADEX) 20 MG tablet Take 2 tablets (40 mg total) by mouth daily. 180 tablet 3  . carvedilol (COREG) 12.5 MG tablet Take 1 tablet (12.5 mg total) by mouth 2 (two) times daily. 180 tablet 3  . hydrALAZINE (APRESOLINE) 25 MG tablet Take 1 tablet (25 mg total) by mouth 3 (three) times daily. 270 tablet 3   No facility-administered medications prior to visit.    Allergies  Allergen Reactions  . Pollen Extract Other (See Comments)    Review of Systems Pertinent positives as noted in history of present illness otherwise negative.   Objective:    Physical Exam Vitals reviewed.  Constitutional:      Appearance: He is obese.  HENT:     Head: Normocephalic.  Cardiovascular:     Rate and Rhythm: Normal rate and regular rhythm.     Pulses: Normal pulses.     Heart  sounds: Normal heart sounds.  Pulmonary:     Effort: Pulmonary effort is normal.     Breath sounds: Normal breath sounds.  Musculoskeletal:        General: Normal range of motion.     Cervical back: Normal range of motion.     Right lower leg: Edema present.     Left lower leg: No edema.  Skin:    General: Skin is warm and dry.  Neurological:     General: No focal deficit present.     Mental Status: He is alert and oriented to person, place, and time.  Psychiatric:        Mood and Affect: Mood normal.        Behavior: Behavior normal.     BP (!) 152/90 (BP Location: Left Arm, Patient Position: Sitting, Cuff Size: Large)   Pulse 77   Temp 97.7 F (36.5 C) (Oral)   Ht '5\' 10"'  (1.778 m)   Wt (!) 393 lb (178.3 kg)   SpO2 97%   BMI 56.39 kg/m  Wt Readings from Last 3 Encounters:  01/18/20 (!) 393 lb (178.3 kg)  10/24/19 (!) 370 lb (167.8 kg)  10/14/19 (!) 387 lb (175.5 kg)   Pulse Readings from Last 3 Encounters:  01/18/20 77  10/24/19 62  10/14/19 72    BP Readings from Last 3 Encounters:  01/18/20 (!) 152/90  10/24/19 (!) 174/91  10/14/19 (!) 146/78    Lab Results  Component Value Date   CHOL 138 01/18/2020   HDL 35.80 (L) 01/18/2020   LDLCALC 85 01/18/2020   TRIG 88.0 01/18/2020   CHOLHDL 4 01/18/2020      Health Maintenance Due  Topic Date Due  . FOOT EXAM  11/19/2018  . COLONOSCOPY  Never done    There are no preventive care reminders to display for this patient.  Lab Results  Component Value Date   TSH 0.78 10/14/2019  Lab Results  Component Value Date   WBC 8.5 10/24/2019   HGB 13.5 10/24/2019   HCT 41.2 10/24/2019   MCV 87.1 10/24/2019   PLT 241 10/24/2019   Lab Results  Component Value Date   NA 136 01/18/2020   K 4.3 01/18/2020   CO2 31 01/18/2020   GLUCOSE 225 (H) 01/18/2020   BUN 15 01/18/2020   CREATININE 1.28 01/18/2020   BILITOT 0.9 01/18/2020   ALKPHOS 87 01/18/2020   AST 14 01/18/2020   ALT 16 01/18/2020   PROT 6.5  01/18/2020   ALBUMIN 3.4 (L) 01/18/2020   CALCIUM 8.4 01/18/2020   ANIONGAP 9 10/24/2019   GFR 64.78 01/18/2020   Lab Results  Component Value Date   CHOL 138 01/18/2020   Lab Results  Component Value Date   HDL 35.80 (L) 01/18/2020   Lab Results  Component Value Date   LDLCALC 85 01/18/2020   Lab Results  Component Value Date   TRIG 88.0 01/18/2020   Lab Results  Component Value Date   CHOLHDL 4 01/18/2020   Lab Results  Component Value Date   HGBA1C 12.5 (H) 01/18/2020      Assessment & Plan:   Problem List Items Addressed This Visit      Cardiovascular and Mediastinum   Heart failure with preserved ejection fraction (HCC)     Endocrine   Type 2 diabetes mellitus with hyperglycemia (HCC)   Relevant Medications   Semaglutide,0.25 or 0.5MG/DOS, (OZEMPIC, 0.25 OR 0.5 MG/DOSE,) 2 MG/1.5ML SOPN     Other   Elevated LFTs   Relevant Orders   Comp Met (CMET) (Completed)   Morbid obesity ([place]) - Primary   Relevant Medications   Semaglutide,0.25 or 0.5MG/DOS, (OZEMPIC, 0.25 OR 0.5 MG/DOSE,) 2 MG/1.5ML SOPN   Hyperlipidemia   Prostate cancer screening   Relevant Orders   PSA (Completed)    Other Visit Diagnoses    Need for immunization against influenza       Relevant Orders   Flu Vaccine QUAD 36+ mos IM (Completed)      Meds ordered this encounter  Medications  . Semaglutide,0.25 or 0.5MG/DOS, (OZEMPIC, 0.25 OR 0.5 MG/DOSE,) 2 MG/1.5ML SOPN    Sig: Inject .25 mg into skin one time a week for 4 weeks. At week 5, inject .50 mg into skin one time a week.    Dispense:  1.5 mL    Refill:  3    Order Specific Question:   Supervising Provider    Answer:   Einar Pheasant [[id number]]   DM: A1c 10.9. Not at goal. He is on samples of Jardiance 10 mg every morning. Catie is working with him on getting medication covered. We have started a new Medicine OZEMPIC which is a one time a week injection into the skin.We discussed how it works and it may help curb appetite  and promote weight loss as well as lowering is blood sugar. He should not experience hypoglycemia.  Ozempic  Week 1-4- Inject .25 mg into the skin one time a week.  Week 5- inject .50 mg into the skin one time a week.   Please check blood sugar fasting and 2 hour after a meal and record. Bring this in to the visit.   CHF: Last EF 50-55%. Followed by Cardiology and CHF clinic. Taking torsemide 40 mg daily, carvedilol 12.5 mg BID, hydralazine 25 mg TID and ASA 81 mg daily. No CP or SOB. No problems with the medication.  HTN: Not at goal.  Lower extremity lymphedema: Followed by Vascular Surgery: Taking torsemide 20 mg x 2 tabs daily. Wearing support stockings  HLD: Adequate goal will monitor. Maintained atorvastatin 40 mg daily-no myalgias. LDL 71  Catie will call for a follow up .   Please call the eye doctor for an  eye exam   Follow-up: Return in about 4 weeks (around 02/15/2020).  This visit occurred during the SARS-CoV-2 public health emergency.  Safety protocols were in place, including screening questions prior to the visit, additional usage of staff PPE, and extensive cleaning of exam room while observing appropriate contact time as indicated for disinfecting solutions.    Denice Paradise, NP

## 2020-01-18 NOTE — Chronic Care Management (AMB) (Signed)
Chronic Care Management   Follow Up Note   01/18/2020 Name: Jeremiah Vance MRN: [id number] DOB: 09/21/1970  Referred by: [name], NP Reason for referral : Chronic Care Management (Medication Management)   Jeremiah Vance is a 49 y.o. year old male who is a primary care patient of [name], NP. The CCM team was consulted for assistance with chronic disease management and care coordination needs.    Met with patient face to face for medication management review.   Review of patient status, including review of consultants reports, relevant laboratory and other test results, and collaboration with appropriate care team members and the patient's provider was performed as part of comprehensive patient evaluation and provision of chronic care management services.    SDOH (Social Determinants of Health) assessments performed: Yes See Care Plan activities for detailed interventions related to [facility])     Outpatient Encounter Medications as of 01/18/2020  Medication Sig   aspirin 81 MG chewable tablet Chew 1 tablet (81 mg total) by mouth daily.   atorvastatin (LIPITOR) 40 MG tablet Take 1 tablet (40 mg total) by mouth daily at 6 PM.   blood glucose meter kit and supplies Dispense based on patient and insurance preference. Use up to four times daily as directed. (FOR ICD-10 E10.9, E11.9).   empagliflozin (JARDIANCE) 10 MG TABS tablet Take 1 tablet (10 mg total) by mouth every morning.   torsemide (DEMADEX) 20 MG tablet Take 2 tablets (40 mg total) by mouth daily.   No facility-administered encounter medications on file as of 01/18/2020.     Objective:   Goals Addressed              This Visit's Progress     Patient Stated     PharmD "I can't afford my medications" (pt-stated)        CARE PLAN ENTRY (see longtitudinal plan of care for additional care plan information)  Current Barriers:   Social, community, and financial barriers: o Transportation  limited by when his sister can transport places.   Polypharmacy; complex patient with multiple comorbidities including DM, CHF, hx CVA, depression  Financial concerns: only has [employer], no prescription drug coverage. Working with [facility] on long-term support for medication access.   Most recent eGFR: ~49 o CHF (last EF 50-55%); now established w/ [facility]F clinic and [facility]); torsemide 40 mg daily, carvedilol 12.5 mg BID, hydralazine 25 mg TID o T2DM: last A1c 10.9%. Jardiance 10 mg; due for updated A1c today - Reports hx metformin, but was d/c "because it made me sick".  o Hx ASCVD: ASA 81 mg daily, atorvastatin 40 mg daily o Vascular disease, edema: established w/ [facility]  Pharmacist Clinical Goal(s):   Over the next 90 days, patient will work with PharmD and provider towards optimized medication management  Interventions:  Comprehensive medication review performed; medication list updated in electronic medical record  Inter-disciplinary care team collaboration (see longitudinal plan of care)  Discussed GLP1, PCP in agreement. Recommend starting Ozempic. Provided counseling and administration education. Counseled on side effects. Provided with sample until [facility] can obtain patient assistance approval.   Patient Self Care Activities:   Patient will take medications as prescribed  Please see past updates related to this goal by clicking on the "Past Updates" button in the selected goal          Plan:  - Will outreach next month as previously scheduled  Catie Darnelle Maffucci, PharmD, [place], CPP  [place] [phone]

## 2020-01-21 ENCOUNTER — Telehealth: Payer: Self-pay | Admitting: Nurse Practitioner

## 2020-01-21 DIAGNOSIS — Z125 Encounter for screening for malignant neoplasm of prostate: Secondary | ICD-10-CM | POA: Insufficient documentation

## 2020-01-21 MED ORDER — LOSARTAN POTASSIUM 25 MG PO TABS: 25.0000 mg | ORAL_TABLET | Freq: Every day | ORAL | 0 refills | Status: DC

## 2020-01-21 NOTE — Telephone Encounter (Signed)
Please call him and let him know that I recommend strarting  Losartan 25 mg one pill every day  for better BP control and it will help protect his kidneys from damage. His Cardiologist agrees.   He will also need LAB and OV appt in 2-3 weeks to check the kidney and BP response. Please arrange- he has transportation issues.

## 2020-01-24 NOTE — Telephone Encounter (Signed)
LMTCB for lab results and to discuss below message

## 2020-01-25 NOTE — Telephone Encounter (Signed)
LMTCB

## 2020-01-27 NOTE — Telephone Encounter (Signed)
Patient aware of below and will pick up rx next time he is out. Patient has appt already scheduled for 02/15/20 at 9:30 am.

## 2020-01-31 ENCOUNTER — Telehealth: Payer: Self-pay | Admitting: Pharmacy Technician

## 2020-01-31 NOTE — Telephone Encounter (Signed)
Patient received a 30 day supply of Losartan.  Provided patient with new patient packet to obtain ongoing [facility] services.  [facility] must receive requested financial documentation within 30 days in order to determine eligibility and provide additional medication assistance.  Sherilyn Dacosta Care Manager [facility]

## 2020-02-15 ENCOUNTER — Telehealth: Payer: Medicaid Other

## 2020-02-15 ENCOUNTER — Ambulatory Visit: Payer: Self-pay | Admitting: Nurse Practitioner

## 2020-02-16 ENCOUNTER — Other Ambulatory Visit: Payer: Self-pay

## 2020-02-18 ENCOUNTER — Telehealth: Payer: Self-pay

## 2020-02-18 ENCOUNTER — Telehealth: Payer: Medicaid Other

## 2020-02-18 NOTE — Chronic Care Management (AMB) (Signed)
  Care Management   Note  02/18/2020 Name: Jeremiah Vance MRN: [id number] DOB: 11/21/1970  Jeremiah Vance is a 49 y.o. year old male who is a primary care patient of Theadore Nan, NP and is actively engaged with the care management team. I reached out to Jeremiah Vance by phone today to assist with re-scheduling a follow up visit with the Pharmacist  Follow up plan: Unsuccessful telephone outreach attempt made. A HIPAA compliant phone message was left for the patient providing contact information and requesting a return call.  The care management team will reach out to the patient again over the next 7 days.  If patient returns call to provider office, please advise to call Embedded Care Management Care Guide Jeremiah Vance  at [phone]  Jeremiah Vance, RMA Care Guide, Embedded Care Coordination [facility]  [place], [place] [address] Direct Dial: [phone] [email or web address]: [email or web address]

## 2020-02-19 ENCOUNTER — Emergency Department: Payer: Self-pay

## 2020-02-19 ENCOUNTER — Other Ambulatory Visit: Payer: Self-pay

## 2020-02-19 ENCOUNTER — Inpatient Hospital Stay
Admission: EM | Admit: 2020-02-19 | Discharge: 2020-02-26 | DRG: 291 | Disposition: A | Discharge status: Home / Self Care | Payer: Self-pay | Attending: Family Medicine | Admitting: Family Medicine

## 2020-02-19 DIAGNOSIS — I130 Hypertensive heart and chronic kidney disease with heart failure and stage 1 through stage 4 chronic kidney disease, or unspecified chronic kidney disease: Principal | ICD-10-CM | POA: Diagnosis present

## 2020-02-19 DIAGNOSIS — I5033 Acute on chronic diastolic (congestive) heart failure: Secondary | ICD-10-CM

## 2020-02-19 DIAGNOSIS — I444 Left anterior fascicular block: Secondary | ICD-10-CM | POA: Diagnosis present

## 2020-02-19 DIAGNOSIS — L97929 Non-pressure chronic ulcer of unspecified part of left lower leg with unspecified severity: Secondary | ICD-10-CM | POA: Diagnosis present

## 2020-02-19 DIAGNOSIS — I160 Hypertensive urgency: Secondary | ICD-10-CM

## 2020-02-19 DIAGNOSIS — I509 Heart failure, unspecified: Secondary | ICD-10-CM

## 2020-02-19 DIAGNOSIS — Z20822 Contact with and (suspected) exposure to covid-19: Secondary | ICD-10-CM | POA: Diagnosis present

## 2020-02-19 DIAGNOSIS — N179 Acute kidney failure, unspecified: Secondary | ICD-10-CM | POA: Diagnosis present

## 2020-02-19 DIAGNOSIS — E118 Type 2 diabetes mellitus with unspecified complications: Secondary | ICD-10-CM

## 2020-02-19 DIAGNOSIS — E11622 Type 2 diabetes mellitus with other skin ulcer: Secondary | ICD-10-CM | POA: Diagnosis present

## 2020-02-19 DIAGNOSIS — Z818 Family history of other mental and behavioral disorders: Secondary | ICD-10-CM

## 2020-02-19 DIAGNOSIS — Z8261 Family history of arthritis: Secondary | ICD-10-CM

## 2020-02-19 DIAGNOSIS — E1122 Type 2 diabetes mellitus with diabetic chronic kidney disease: Secondary | ICD-10-CM | POA: Diagnosis present

## 2020-02-19 DIAGNOSIS — K7469 Other cirrhosis of liver: Secondary | ICD-10-CM

## 2020-02-19 DIAGNOSIS — Z9119 Patient's noncompliance with other medical treatment and regimen: Secondary | ICD-10-CM

## 2020-02-19 DIAGNOSIS — Z7722 Contact with and (suspected) exposure to environmental tobacco smoke (acute) (chronic): Secondary | ICD-10-CM | POA: Diagnosis present

## 2020-02-19 DIAGNOSIS — Z7982 Long term (current) use of aspirin: Secondary | ICD-10-CM

## 2020-02-19 DIAGNOSIS — Z8249 Family history of ischemic heart disease and other diseases of the circulatory system: Secondary | ICD-10-CM

## 2020-02-19 DIAGNOSIS — J9601 Acute respiratory failure with hypoxia: Secondary | ICD-10-CM

## 2020-02-19 DIAGNOSIS — Z79899 Other long term (current) drug therapy: Secondary | ICD-10-CM

## 2020-02-19 DIAGNOSIS — E785 Hyperlipidemia, unspecified: Secondary | ICD-10-CM | POA: Diagnosis present

## 2020-02-19 DIAGNOSIS — G4733 Obstructive sleep apnea (adult) (pediatric): Secondary | ICD-10-CM | POA: Diagnosis present

## 2020-02-19 DIAGNOSIS — Z6841 Body Mass Index (BMI) 40.0 and over, adult: Secondary | ICD-10-CM

## 2020-02-19 DIAGNOSIS — E1165 Type 2 diabetes mellitus with hyperglycemia: Secondary | ICD-10-CM | POA: Diagnosis present

## 2020-02-19 DIAGNOSIS — Z8673 Personal history of transient ischemic attack (TIA), and cerebral infarction without residual deficits: Secondary | ICD-10-CM

## 2020-02-19 DIAGNOSIS — K439 Ventral hernia without obstruction or gangrene: Secondary | ICD-10-CM

## 2020-02-19 DIAGNOSIS — Z823 Family history of stroke: Secondary | ICD-10-CM

## 2020-02-19 DIAGNOSIS — Z833 Family history of diabetes mellitus: Secondary | ICD-10-CM

## 2020-02-19 DIAGNOSIS — I5032 Chronic diastolic (congestive) heart failure: Secondary | ICD-10-CM

## 2020-02-19 DIAGNOSIS — M542 Cervicalgia: Secondary | ICD-10-CM

## 2020-02-19 DIAGNOSIS — Z83438 Family history of other disorder of lipoprotein metabolism and other lipidemia: Secondary | ICD-10-CM

## 2020-02-19 DIAGNOSIS — R059 Cough, unspecified: Secondary | ICD-10-CM

## 2020-02-19 DIAGNOSIS — J449 Chronic obstructive pulmonary disease, unspecified: Secondary | ICD-10-CM | POA: Diagnosis present

## 2020-02-19 DIAGNOSIS — N1831 Chronic kidney disease, stage 3a: Secondary | ICD-10-CM | POA: Diagnosis present

## 2020-02-19 DIAGNOSIS — I872 Venous insufficiency (chronic) (peripheral): Secondary | ICD-10-CM | POA: Diagnosis present

## 2020-02-19 DIAGNOSIS — Z7984 Long term (current) use of oral hypoglycemic drugs: Secondary | ICD-10-CM

## 2020-02-19 DIAGNOSIS — N189 Chronic kidney disease, unspecified: Secondary | ICD-10-CM

## 2020-02-19 DIAGNOSIS — E1169 Type 2 diabetes mellitus with other specified complication: Secondary | ICD-10-CM | POA: Diagnosis present

## 2020-02-19 DIAGNOSIS — R0602 Shortness of breath: Secondary | ICD-10-CM

## 2020-02-19 DIAGNOSIS — R0902 Hypoxemia: Secondary | ICD-10-CM

## 2020-02-19 LAB — COMPREHENSIVE METABOLIC PANEL
ALT: 22 U/L (ref 0–44)
AST: 21 U/L (ref 15–41)
Albumin: 3.4 g/dL — ABNORMAL LOW (ref 3.5–5.0)
Alkaline Phosphatase: 89 U/L (ref 38–126)
Anion gap: 11 (ref 5–15)
BUN: 17 mg/dL (ref 6–20)
CO2: 26 mmol/L (ref 22–32)
Calcium: 8.5 mg/dL — ABNORMAL LOW (ref 8.9–10.3)
Chloride: 98 mmol/L (ref 98–111)
Creatinine, Ser: 1.41 mg/dL — ABNORMAL HIGH (ref 0.61–1.24)
GFR, Estimated: >60 mL/min (ref >60)
Glucose, Bld: 385 mg/dL — ABNORMAL HIGH (ref 70–99)
Potassium: 4.4 mmol/L (ref 3.5–5.1)
Sodium: 135 mmol/L (ref 135–145)
Total Bilirubin: 0.8 mg/dL (ref 0.3–1.2)
Total Protein: 7.3 g/dL (ref 6.5–8.1)

## 2020-02-19 LAB — CBC
HCT: 45.3 % (ref 39.0–52.0)
Hemoglobin: 14.2 g/dL (ref 13.0–17.0)
MCH: 27.6 pg (ref 26.0–34.0)
MCHC: 31.3 g/dL (ref 30.0–36.0)
MCV: 88 fL (ref 80.0–100.0)
Platelets: 260 10*3/uL (ref 150–400)
RBC: 5.15 MIL/uL (ref 4.22–5.81)
RDW: 13.3 % (ref 11.5–15.5)
WBC: 10.3 10*3/uL (ref 4.0–10.5)
nRBC: 0 % (ref 0.0–0.2)

## 2020-02-19 LAB — TROPONIN I (HIGH SENSITIVITY): Troponin I (High Sensitivity): 37 ng/L — ABNORMAL HIGH (ref <18)

## 2020-02-19 LAB — BRAIN NATRIURETIC PEPTIDE: B Natriuretic Peptide: 267.9 pg/mL — ABNORMAL HIGH (ref 0.0–100.0)

## 2020-02-19 MED ORDER — BUMETANIDE 0.25 MG/ML IJ SOLN
1.0000 mg | Freq: Once | INTRAMUSCULAR | Status: AC
  Administered 2020-02-20: 1 mg via INTRAVENOUS
  Filled 2020-02-19: qty 4

## 2020-02-19 NOTE — ED Triage Notes (Signed)
Pt states he has had some shortness of breath for a while, but today it got worse. Pt states he also believes he has a fever. Pt denies asthma or copd.

## 2020-02-19 NOTE — ED Provider Notes (Signed)
[facility] Emergency Department Provider Note   ____________________________________________   First MD Initiated Contact with Patient 02/19/20 2323     (approximate)  I have reviewed the triage vital signs and the nursing notes.   HISTORY  Chief Complaint Shortness of Breath    HPI Jeremiah Vance is a 49 y.o. male who presents to the ED from home with a chief complaint of shortness of breath.  Patient has a history of morbid obesity, hypertension, cardiomyopathy, CHF, type 2 diabetes, CKD who states he remains short of breath all the time but it has increased over the past several days and associated with dry cough.  Also believes he has a fever.  Complains of dyspnea on exertion.  Denies chest pain, abdominal pain, nausea, vomiting or diarrhea.  No change in BLE edema.  Denies recent travel trauma.     Past Medical History:  Diagnosis Date  . Acute ischemic stroke ([place]) 09/06/2017  . Acute renal failure superimposed on stage 3a chronic kidney disease ([place]) 07/08/2019  . Acute right-sided weakness   . Allergies   . Anasarca   . Anemia 07/10/2017  . Arthritis   . CHF (congestive heart failure) ([place])    "coinsided w/kidney problems I was having 06/2017"  . Chicken pox   . CKD (chronic kidney disease) stage 3, GFR 30-59 ml/min (HCC) 06/2017  . Depression   . Elevated troponin 07/08/2019  . High cholesterol   . History of cardiomyopathy    LVEF 40 to 45% in April 2019 - subsequently normalized  . Hyperbilirubinemia 07/10/2017  . Hypertension   . Ischemic stroke ([place])    Small left internal capsule infarct due to lacunar disease  . Morbid obesity ([place])   . Normocytic anemia 12/16/2017  . 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 Stroke ([place]) 08/2017   "right sided weakness since; getting stronger though" (10/22/2017)  . Type 2 diabetes mellitus [facility])     Patient Active Problem List   Diagnosis Date Noted  .  Prostate cancer screening 01/21/2020  . Lymphedema of both lower extremities 10/17/2019  . History of stroke 10/17/2019  . CKD (chronic kidney disease) stage 3, GFR 30-59 ml/min (HCC) 08/31/2019  . Swelling of limb 08/31/2019  . Diabetes mellitus ([place]) 07/08/2019  . Chest pain 07/08/2019  . Vitreous floaters of both eyes 06/03/2018  . Moderate obstructive sleep apnea 02/11/2018  . Chronic fatigue 12/16/2017  . Essential hypertension 12/16/2017  . History of small bowel obstruction 10/24/2017  . Adjustment disorder with depressed mood 09/30/2017  . Stroke ([place]) 09/14/2017  . Acute on chronic diastolic CHF (congestive heart failure) ([place])   . Hyperlipidemia   . Gout   . Elevated LFTs   . Heart failure with preserved ejection fraction ([place])   . Morbid obesity ([place])   . Hypoalbuminemia 07/11/2017  . Type 2 diabetes mellitus with hyperglycemia ([place]) 07/10/2017    Past Surgical History:  Procedure Laterality Date  . ABDOMINAL HERNIA REPAIR  2008; 10/22/2017   "scope; OPEN REPAIR INCARCERATED VENTRAL HERNIA  . HERNIA REPAIR    . KNEE ARTHROSCOPY Right 1989  . VENTRAL HERNIA REPAIR N/A 10/22/2017   Procedure: OPEN REPAIR INCARCERATED VENTRAL HERNIA;  Surgeon: Excell Seltzer, MD;  Location: [facility];  Service: General;  Laterality: N/A;    Prior to Admission medications   Medication Sig Start Date End Date Taking? Authorizing Provider  aspirin 81 MG chewable tablet Chew 1 tablet (81 mg total)  by mouth daily. 09/20/19   Alisa Graff, FNP  atorvastatin (LIPITOR) 40 MG tablet Take 1 tablet (40 mg total) by mouth daily at 6 PM. 09/20/19   Alisa Graff, FNP  blood glucose meter kit and supplies Dispense based on patient and insurance preference. Use up to four times daily as directed. (FOR ICD-10 E10.9, E11.9). 08/03/19   [name], NP  empagliflozin (JARDIANCE) 10 MG TABS tablet Take 1 tablet (10 mg total) by mouth every morning. 09/17/19   [name], NP  losartan (COZAAR)  25 MG tablet Take 1 tablet (25 mg total) by mouth daily. 01/21/20   [name], NP  Semaglutide,0.25 or 0.5MG/DOS, (OZEMPIC, 0.25 OR 0.5 MG/DOSE,) 2 MG/1.5ML SOPN Inject .25 mg into skin one time a week for 4 weeks. At week 5, inject .50 mg into skin one time a week. 01/18/20   [name], NP  torsemide (DEMADEX) 20 MG tablet Take 2 tablets (40 mg total) by mouth daily. 09/20/19   Alisa Graff, FNP    Allergies Pollen extract  Family History  Problem Relation Age of Onset  . Diabetes Father   . Heart disease Father   . Arthritis Father   . Hearing loss Father   . Hyperlipidemia Father   . Heart attack Father   . Hypertension Father   . Stroke Father   . Diabetes Sister   . Diabetes Mother   . Stroke Mother   . Arthritis Mother   . Depression Mother   . Heart disease Mother   . Hypertension Mother   . Learning disabilities Mother   . Mental illness Mother   . Diabetes Maternal Grandmother   . Heart disease Maternal Grandmother   . Depression Maternal Grandmother   . Hyperlipidemia Maternal Grandmother   . Hypertension Maternal Grandmother   . Stroke Maternal Grandmother   . Stroke Paternal Grandfather   . Heart disease Paternal Grandfather   . Arthritis Paternal Grandfather   . Heart attack Paternal Grandfather   . Hypertension Maternal Grandfather   . Hyperlipidemia Maternal Grandfather   . Stroke Maternal Grandfather   . Arthritis Paternal Grandmother   . Hearing loss Paternal Grandmother   . Depression Sister   . Diabetes Sister   . Hypertension Sister   . Mental illness Sister     Social History Social History   Tobacco Use  . Smoking status: Never Smoker  . Smokeless tobacco: Never Used  Vaping Use  . Vaping Use: Never used  Substance Use Topics  . Alcohol use: Not Currently  . Drug use: Never    Review of Systems  Constitutional: Positive for subjective fever Eyes: No visual changes. ENT: No sore throat. Cardiovascular: Denies  chest pain. Respiratory: Positive for cough and shortness of breath. Gastrointestinal: No abdominal pain.  No nausea, no vomiting.  No diarrhea.  No constipation. Genitourinary: Negative for dysuria. Musculoskeletal: Negative for back pain. Skin: Negative for rash. Neurological: Negative for headaches, focal weakness or numbness.   ____________________________________________   PHYSICAL EXAM:  VITAL SIGNS: ED Triage Vitals  Enc Vitals Group     BP 02/19/20 2206 (!) 191/88     Pulse Rate 02/19/20 2206 89     Resp 02/19/20 2206 (!) 24     Temp 02/19/20 2206 99.1 F (37.3 C)     Temp src --      SpO2 02/19/20 2206 94 %     Weight 02/19/20 2203 (!) 395 lb (179.2  kg)     Height 02/19/20 2203 '5\' 10"'  (1.778 m)     Head Circumference --      Peak Flow --      Pain Score 02/19/20 2203 8     Pain Loc --      Pain Edu? --      Excl. in [place]? --     Constitutional: Alert and oriented.  Chronically ill appearing and in mild acute distress. Eyes: Conjunctivae are normal. PERRL. EOMI. Head: Atraumatic. Nose: No congestion/rhinnorhea. Mouth/Throat: Mucous membranes are moist.   Neck: No stridor.   Cardiovascular: Normal rate, regular rhythm. Grossly normal heart sounds.  Good peripheral circulation. Respiratory: Increased respiratory effort.  No retractions. Lung sounds distant with faint bibasilar rales. Gastrointestinal: Soft and nontender. No distention. No abdominal bruits. No CVA tenderness. Musculoskeletal: No lower extremity tenderness.  3+ BLE nonpitting edema.  No joint effusions. Neurologic:  Normal speech and language. No gross focal neurologic deficits are appreciated. No gait instability. Skin:  Skin is warm, dry and intact. No rash noted. Psychiatric: Mood and affect are normal. Speech and behavior are normal.  ____________________________________________   LABS (all labs ordered are listed, but only abnormal results are displayed)  Labs Reviewed  COMPREHENSIVE  METABOLIC PANEL - Abnormal; Notable for the following components:      Result Value   Glucose, Bld 385 (*)    Creatinine, Ser 1.41 (*)    Calcium 8.5 (*)    Albumin 3.4 (*)    All other components within normal limits  BRAIN NATRIURETIC PEPTIDE - Abnormal; Notable for the following components:   B Natriuretic Peptide 267.9 (*)    All other components within normal limits  TROPONIN I (HIGH SENSITIVITY) - Abnormal; Notable for the following components:   Troponin I (High Sensitivity) 37 (*)    All other components within normal limits  RESP PANEL BY RT-PCR (FLU A&B, COVID) ARPGX2  CBC  URINALYSIS, COMPLETE (UACMP) WITH MICROSCOPIC  TROPONIN I (HIGH SENSITIVITY)   ____________________________________________  EKG  ED ECG REPORT I, Janika Jedlicka J, the attending physician, personally viewed and interpreted this ECG.   Date: 02/19/2020  EKG Time: 2159  Rate: 87  Rhythm: normal EKG, normal sinus rhythm  Axis: LAD  Intervals:left anterior fascicular block  ST&T Change: Nonspecific  ____________________________________________  RADIOLOGY Cecilie Kicks J, personally viewed and evaluated these images (plain radiographs) as part of my medical decision making, as well as reviewing the written report by the radiologist.  ED MD interpretation: Pulmonary vascular congestion  Official radiology report(s): DG Chest 2 View  Result Date: 02/19/2020 CLINICAL DATA:  Shortness of breath. EXAM: CHEST - 2 VIEW COMPARISON:  October 24, 2019 FINDINGS: The mild prominence of the pulmonary vasculature is seen. This is slightly more pronounced within the perihilar regions. There is no evidence of acute infiltrate, pleural effusion or pneumothorax. The cardiac silhouette is mildly enlarged. The visualized skeletal structures are unremarkable. IMPRESSION: Mild pulmonary vascular congestion. Electronically Signed   By: Virgina Norfolk M.D.   On: 02/19/2020 22:36     ____________________________________________   PROCEDURES  Procedure(s) performed (including Critical Care):  Procedures   ____________________________________________   INITIAL IMPRESSION / ASSESSMENT AND PLAN / ED COURSE  As part of my medical decision making, I reviewed the following data within the [facility] notes reviewed and incorporated, Labs reviewed, EKG interpreted, Old chart reviewed, Radiograph reviewed and Notes from prior ED visits     49 year old male presenting with shortness of  breath Differential includes, but is not limited to, viral syndrome, bronchitis including COPD exacerbation, pneumonia, reactive airway disease including asthma, CHF including exacerbation with or without pulmonary/interstitial edema, pneumothorax, ACS, thoracic trauma, and pulmonary embolism.  Laboratory results demonstrate mild elevation of creatinine and troponin.  Will perform ambulation trial.  Obtain respiratory panel.  Consider IV Bumex.  Clinical Course as of Feb 19 46  Sat Feb 19, 2020  2340 Patient ambulated on room air with saturations 90 to 91%, tachypnea, stopping frequently.   [JS]  Sun Feb 20, 2020  1657 Room air saturation 87%, patient placed on 2 L nasal cannula oxygen.  Will discuss with hospitalist services for admission.   [JS]    Clinical Course User Index [JS] Paulette Blanch, MD     ____________________________________________   FINAL CLINICAL IMPRESSION(S) / ED DIAGNOSES  Final diagnoses:  Shortness of breath  Acute on chronic congestive heart failure, unspecified heart failure type ([place])  Hypoxia     ED Discharge Orders    None      *Please note:  Jeremiah Vance was evaluated in Emergency Department on 02/20/2020 for the symptoms described in the history of present illness. He was evaluated in the context of the global COVID-19 pandemic, which necessitated consideration that the patient might be at risk for infection  with the SARS-CoV-2 virus that causes COVID-19. Institutional protocols and algorithms that pertain to the evaluation of patients at risk for COVID-19 are in a state of rapid change based on information released by regulatory bodies including the CDC and federal and state organizations. These policies and algorithms were followed during the patient's care in the ED.  Some ED evaluations and interventions may be delayed as a result of limited staffing during and the pandemic.*   Note:  This document was prepared using Dragon voice recognition software and may include unintentional dictation errors.   Paulette Blanch, MD 02/20/20 [phone]

## 2020-02-19 NOTE — ED Notes (Signed)
EKG completed and given to provider for review.

## 2020-02-20 ENCOUNTER — Encounter: Payer: Self-pay | Admitting: Family Medicine

## 2020-02-20 ENCOUNTER — Inpatient Hospital Stay: Payer: Self-pay

## 2020-02-20 DIAGNOSIS — M542 Cervicalgia: Secondary | ICD-10-CM

## 2020-02-20 DIAGNOSIS — I5033 Acute on chronic diastolic (congestive) heart failure: Secondary | ICD-10-CM

## 2020-02-20 DIAGNOSIS — E785 Hyperlipidemia, unspecified: Secondary | ICD-10-CM

## 2020-02-20 DIAGNOSIS — J9601 Acute respiratory failure with hypoxia: Secondary | ICD-10-CM

## 2020-02-20 DIAGNOSIS — N1832 Chronic kidney disease, stage 3b: Secondary | ICD-10-CM

## 2020-02-20 DIAGNOSIS — I509 Heart failure, unspecified: Secondary | ICD-10-CM

## 2020-02-20 DIAGNOSIS — E1165 Type 2 diabetes mellitus with hyperglycemia: Secondary | ICD-10-CM

## 2020-02-20 DIAGNOSIS — E1169 Type 2 diabetes mellitus with other specified complication: Secondary | ICD-10-CM

## 2020-02-20 DIAGNOSIS — I160 Hypertensive urgency: Secondary | ICD-10-CM

## 2020-02-20 DIAGNOSIS — R0602 Shortness of breath: Secondary | ICD-10-CM

## 2020-02-20 LAB — GLUCOSE, CAPILLARY
Glucose-Capillary: 306 mg/dL — ABNORMAL HIGH (ref 70–99)
Glucose-Capillary: 301 mg/dL — ABNORMAL HIGH (ref 70–99)
Glucose-Capillary: 262 mg/dL — ABNORMAL HIGH (ref 70–99)
Glucose-Capillary: 269 mg/dL — ABNORMAL HIGH (ref 70–99)
Glucose-Capillary: 255 mg/dL — ABNORMAL HIGH (ref 70–99)

## 2020-02-20 LAB — BASIC METABOLIC PANEL
Anion gap: 11 (ref 5–15)
BUN: 17 mg/dL (ref 6–20)
CO2: 25 mmol/L (ref 22–32)
Calcium: 8.5 mg/dL — ABNORMAL LOW (ref 8.9–10.3)
Chloride: 99 mmol/L (ref 98–111)
Creatinine, Ser: 1.33 mg/dL — ABNORMAL HIGH (ref 0.61–1.24)
GFR, Estimated: >60 mL/min (ref >60)
Glucose, Bld: 317 mg/dL — ABNORMAL HIGH (ref 70–99)
Potassium: 4.1 mmol/L (ref 3.5–5.1)
Sodium: 135 mmol/L (ref 135–145)

## 2020-02-20 LAB — RESP PANEL BY RT-PCR (FLU A&B, COVID) ARPGX2
Influenza A by PCR: NEGATIVE
Influenza B by PCR: NEGATIVE
SARS Coronavirus 2 by RT PCR: NEGATIVE

## 2020-02-20 LAB — CBC
HCT: 44.1 % (ref 39.0–52.0)
Hemoglobin: 14 g/dL (ref 13.0–17.0)
MCH: 27.6 pg (ref 26.0–34.0)
MCHC: 31.7 g/dL (ref 30.0–36.0)
MCV: 86.8 fL (ref 80.0–100.0)
Platelets: 244 10*3/uL (ref 150–400)
RBC: 5.08 MIL/uL (ref 4.22–5.81)
RDW: 13.3 % (ref 11.5–15.5)
WBC: 9.2 10*3/uL (ref 4.0–10.5)
nRBC: 0 % (ref 0.0–0.2)

## 2020-02-20 LAB — MAGNESIUM: Magnesium: 2 mg/dL (ref 1.7–2.4)

## 2020-02-20 LAB — HEMOGLOBIN A1C
Hgb A1c MFr Bld: 12 % — ABNORMAL HIGH (ref 4.8–5.6)
Mean Plasma Glucose: 297.7 mg/dL

## 2020-02-20 LAB — TSH: TSH: 1.065 u[IU]/mL (ref 0.350–4.500)

## 2020-02-20 LAB — TROPONIN I (HIGH SENSITIVITY): Troponin I (High Sensitivity): 38 ng/L — ABNORMAL HIGH (ref <18)

## 2020-02-20 MED ORDER — ONDANSETRON HCL 4 MG/2ML IJ SOLN
4.0000 mg | Freq: Four times a day (QID) | INTRAMUSCULAR | Status: DC | PRN
  Administered 2020-02-20: 4 mg via INTRAVENOUS
  Filled 2020-02-20: qty 2

## 2020-02-20 MED ORDER — INSULIN GLARGINE 100 UNIT/ML ~~LOC~~ SOLN
20.0000 [IU] | Freq: Every day | SUBCUTANEOUS | Status: DC
  Administered 2020-02-20: 20 [IU] via SUBCUTANEOUS
  Filled 2020-02-20 (×2): qty 0.2

## 2020-02-20 MED ORDER — ACETAMINOPHEN 650 MG RE SUPP: 650.0000 mg | Freq: Four times a day (QID) | RECTAL | Status: DC | PRN

## 2020-02-20 MED ORDER — TRAZODONE HCL 50 MG PO TABS: 25.0000 mg | ORAL_TABLET | Freq: Every evening | ORAL | Status: DC | PRN

## 2020-02-20 MED ORDER — ONDANSETRON HCL 4 MG PO TABS: 4.0000 mg | ORAL_TABLET | Freq: Four times a day (QID) | ORAL | Status: DC | PRN

## 2020-02-20 MED ORDER — EMPAGLIFLOZIN 10 MG PO TABS
10.0000 mg | ORAL_TABLET | Freq: Every morning | ORAL | Status: DC
  Administered 2020-02-20 – 2020-02-26 (×7): 10 mg via ORAL
  Filled 2020-02-20 (×7): qty 1

## 2020-02-20 MED ORDER — INSULIN ASPART 100 UNIT/ML ~~LOC~~ SOLN
0.0000 [IU] | Freq: Every day | SUBCUTANEOUS | Status: DC
  Administered 2020-02-20: 3 [IU] via SUBCUTANEOUS
  Administered 2020-02-22: 2 [IU] via SUBCUTANEOUS
  Administered 2020-02-23: 3 [IU] via SUBCUTANEOUS
  Administered 2020-02-24: 2 [IU] via SUBCUTANEOUS
  Filled 2020-02-20 (×4): qty 1

## 2020-02-20 MED ORDER — IOHEXOL 9 MG/ML PO SOLN
500.0000 mL | ORAL | Status: AC
  Administered 2020-02-20 (×2): 500 mL via ORAL

## 2020-02-20 MED ORDER — FUROSEMIDE 10 MG/ML IJ SOLN
60.0000 mg | Freq: Two times a day (BID) | INTRAMUSCULAR | Status: DC
  Administered 2020-02-20 – 2020-02-21 (×3): 60 mg via INTRAVENOUS
  Filled 2020-02-20 (×3): qty 6

## 2020-02-20 MED ORDER — LOSARTAN POTASSIUM 50 MG PO TABS
25.0000 mg | ORAL_TABLET | Freq: Every day | ORAL | Status: DC
  Administered 2020-02-20 – 2020-02-21 (×2): 25 mg via ORAL
  Filled 2020-02-20 (×2): qty 1

## 2020-02-20 MED ORDER — INSULIN ASPART 100 UNIT/ML ~~LOC~~ SOLN
0.0000 [IU] | Freq: Three times a day (TID) | SUBCUTANEOUS | Status: DC
  Administered 2020-02-20: 8 [IU] via SUBCUTANEOUS
  Administered 2020-02-20: 11 [IU] via SUBCUTANEOUS
  Filled 2020-02-20 (×2): qty 1

## 2020-02-20 MED ORDER — ENOXAPARIN SODIUM 100 MG/ML ~~LOC~~ SOLN
90.0000 mg | SUBCUTANEOUS | Status: DC
  Administered 2020-02-20 – 2020-02-22 (×3): 90 mg via SUBCUTANEOUS
  Filled 2020-02-20 (×3): qty 1

## 2020-02-20 MED ORDER — ATORVASTATIN CALCIUM 20 MG PO TABS
40.0000 mg | ORAL_TABLET | Freq: Every day | ORAL | Status: DC
  Administered 2020-02-20 – 2020-02-25 (×6): 40 mg via ORAL
  Filled 2020-02-20 (×6): qty 2

## 2020-02-20 MED ORDER — HYDRALAZINE HCL 25 MG PO TABS
25.0000 mg | ORAL_TABLET | Freq: Three times a day (TID) | ORAL | Status: DC
  Administered 2020-02-20 – 2020-02-21 (×3): 25 mg via ORAL
  Filled 2020-02-20 (×3): qty 1

## 2020-02-20 MED ORDER — SODIUM CHLORIDE 0.9% FLUSH
3.0000 mL | INTRAVENOUS | Status: DC | PRN
  Administered 2020-02-22 – 2020-02-24 (×3): 3 mL via INTRAVENOUS

## 2020-02-20 MED ORDER — ACETAMINOPHEN 325 MG PO TABS
650.0000 mg | ORAL_TABLET | Freq: Four times a day (QID) | ORAL | Status: DC | PRN
  Administered 2020-02-20 – 2020-02-21 (×3): 650 mg via ORAL
  Filled 2020-02-20 (×4): qty 2

## 2020-02-20 MED ORDER — ASPIRIN EC 81 MG PO TBEC: 81.0000 mg | DELAYED_RELEASE_TABLET | Freq: Every day | ORAL | Status: DC

## 2020-02-20 MED ORDER — CARVEDILOL 12.5 MG PO TABS
12.5000 mg | ORAL_TABLET | Freq: Two times a day (BID) | ORAL | Status: DC
  Administered 2020-02-20 – 2020-02-21 (×2): 12.5 mg via ORAL
  Filled 2020-02-20 (×2): qty 1

## 2020-02-20 MED ORDER — INSULIN ASPART 100 UNIT/ML ~~LOC~~ SOLN
0.0000 [IU] | Freq: Three times a day (TID) | SUBCUTANEOUS | Status: DC
  Administered 2020-02-20: 5 [IU] via SUBCUTANEOUS
  Administered 2020-02-21: 1 [IU] via SUBCUTANEOUS
  Administered 2020-02-21: 5 [IU] via SUBCUTANEOUS
  Administered 2020-02-21 – 2020-02-22 (×2): 2 [IU] via SUBCUTANEOUS
  Administered 2020-02-22: 3 [IU] via SUBCUTANEOUS
  Administered 2020-02-22 – 2020-02-23 (×3): 2 [IU] via SUBCUTANEOUS
  Administered 2020-02-23: 1 [IU] via SUBCUTANEOUS
  Administered 2020-02-24 (×2): 2 [IU] via SUBCUTANEOUS
  Administered 2020-02-24: 3 [IU] via SUBCUTANEOUS
  Administered 2020-02-25: 2 [IU] via SUBCUTANEOUS
  Administered 2020-02-25: 1 [IU] via SUBCUTANEOUS
  Administered 2020-02-25: 3 [IU] via SUBCUTANEOUS
  Administered 2020-02-26: 2 [IU] via SUBCUTANEOUS
  Filled 2020-02-20 (×18): qty 1

## 2020-02-20 MED ORDER — ASPIRIN 81 MG PO CHEW
81.0000 mg | CHEWABLE_TABLET | Freq: Every day | ORAL | Status: DC
  Administered 2020-02-20 – 2020-02-26 (×7): 81 mg via ORAL
  Filled 2020-02-20 (×7): qty 1

## 2020-02-20 MED ORDER — CYCLOBENZAPRINE HCL 10 MG PO TABS
5.0000 mg | ORAL_TABLET | Freq: Once | ORAL | Status: AC
  Administered 2020-02-20: 5 mg via ORAL
  Filled 2020-02-20: qty 1

## 2020-02-20 MED ORDER — SODIUM CHLORIDE 0.9% FLUSH
3.0000 mL | Freq: Two times a day (BID) | INTRAVENOUS | Status: DC
  Administered 2020-02-20 – 2020-02-26 (×14): 3 mL via INTRAVENOUS

## 2020-02-20 MED ORDER — INSULIN ASPART 100 UNIT/ML ~~LOC~~ SOLN
3.0000 [IU] | Freq: Three times a day (TID) | SUBCUTANEOUS | Status: DC
  Administered 2020-02-20 – 2020-02-21 (×2): 3 [IU] via SUBCUTANEOUS
  Filled 2020-02-20 (×2): qty 1

## 2020-02-20 MED ORDER — LABETALOL HCL 5 MG/ML IV SOLN
20.0000 mg | INTRAVENOUS | Status: DC | PRN
  Administered 2020-02-20 (×2): 20 mg via INTRAVENOUS
  Filled 2020-02-20 (×3): qty 4

## 2020-02-20 MED ORDER — MAGNESIUM HYDROXIDE 400 MG/5ML PO SUSP: 30.0000 mL | Freq: Every day | ORAL | Status: DC | PRN

## 2020-02-20 MED ORDER — CYCLOBENZAPRINE HCL 10 MG PO TABS
5.0000 mg | ORAL_TABLET | Freq: Three times a day (TID) | ORAL | Status: DC | PRN
  Administered 2020-02-21: 5 mg via ORAL
  Filled 2020-02-20: qty 1

## 2020-02-20 MED ORDER — SODIUM CHLORIDE 0.9 % IV SOLN: 250.0000 mL | INTRAVENOUS | Status: DC | PRN

## 2020-02-20 MED ORDER — GUAIFENESIN-DM 100-10 MG/5ML PO SYRP
5.0000 mL | ORAL_SOLUTION | ORAL | Status: DC | PRN
  Administered 2020-02-20 – 2020-02-21 (×2): 5 mL via ORAL
  Filled 2020-02-20 (×2): qty 5

## 2020-02-20 NOTE — Progress Notes (Signed)
Patient ID: Jeremiah Vance, male   DOB: October 22, 1970, 49 y.o.   MRN: [id number] [facility] PROGRESS NOTE  KAMERON BLETHEN MBW:[id number] DOB: [birth year] DOA: 02/19/2020 PCP: Theadore Nan, NP  HPI/Subjective: Patient came in with shortness of breath.  States he has some weight going up and down.  Patient complaining of some neck pain.  Admitted with CHF exacerbation.  Objective: Vitals:   02/20/20 1409 02/20/20 1427  BP: (!) 148/73   Pulse: (!) 59 62  Resp:    Temp:    SpO2:      Intake/Output Summary (Last 24 hours) at 02/20/2020 1435 Last data filed at 02/20/2020 1411 Gross per 24 hour  Intake 360 ml  Output 2150 ml  Net -1790 ml   Filed Weights   02/19/20 2203 02/20/20 0317  Weight: (!) 179.2 kg (!) 179.2 kg    ROS: Review of Systems  Respiratory: Positive for shortness of breath.   Cardiovascular: Negative for chest pain.  Gastrointestinal: Positive for nausea and vomiting. Negative for abdominal pain.  Musculoskeletal: Positive for neck pain.   Exam: Physical Exam HENT:     Head: Normocephalic.     Mouth/Throat:     Pharynx: No oropharyngeal exudate.  Eyes:     General: Lids are normal.     Conjunctiva/sclera: Conjunctivae normal.     Pupils: Pupils are equal, round, and reactive to light.  Cardiovascular:     Rate and Rhythm: Normal rate and regular rhythm.     Heart sounds: Normal heart sounds, S1 normal and S2 normal.  Pulmonary:     Breath sounds: Examination of the right-lower field reveals decreased breath sounds and rales. Examination of the left-lower field reveals decreased breath sounds and rales. Decreased breath sounds and rales present. No wheezing or rhonchi.  Abdominal:     Palpations: Abdomen is soft.     Tenderness: There is no abdominal tenderness.  Musculoskeletal:     Right lower leg: Swelling present.     Left lower leg: Swelling present.  Skin:    General: Skin is warm.     Comments: Chronic bilateral lower extremity skin  discoloration.  Neurological:     Mental Status: He is alert.     Cranial Nerves: Cranial nerves are intact.       Data Reviewed: Basic Metabolic Panel: Recent Labs  Lab 02/19/20 2214 02/20/20 0028 02/20/20 0514  NA 135  --  135  K 4.4  --  4.1  CL 98  --  99  CO2 26  --  25  GLUCOSE 385*  --  317*  BUN 17  --  17  CREATININE 1.41*  --  1.33*  CALCIUM 8.5*  --  8.5*  MG  --  2.0  --    Liver Function Tests: Recent Labs  Lab 02/19/20 2214  AST 21  ALT 22  ALKPHOS 89  BILITOT 0.8  PROT 7.3  ALBUMIN 3.4*   CBC: Recent Labs  Lab 02/19/20 2214 02/20/20 0514  WBC 10.3 9.2  HGB 14.2 14.0  HCT 45.3 44.1  MCV 88.0 86.8  PLT 260 244   BNP (last 3 results) Recent Labs    07/08/19 0938 02/19/20 2214  BNP 219.0* 267.9*    CBG: Recent Labs  Lab 02/20/20 0326 02/20/20 0759 02/20/20 1157  GLUCAP 306* 301* 262*    Recent Results (from the past 240 hour(s))  Resp Panel by RT-PCR (Flu A&B, Covid) Nasopharyngeal Swab     Status: None  Collection Time: 02/19/20 11:28 PM   Specimen: Nasopharyngeal Swab; Nasopharyngeal(NP) swabs in vial transport medium  Result Value Ref Range Status   SARS Coronavirus 2 by RT PCR NEGATIVE NEGATIVE Final    Comment: (NOTE) SARS-CoV-2 target nucleic acids are NOT DETECTED.  The SARS-CoV-2 RNA is generally detectable in upper respiratory specimens during the acute phase of infection. The lowest concentration of SARS-CoV-2 viral copies this assay can detect is 138 copies/mL. A negative result does not preclude SARS-Cov-2 infection and should not be used as the sole basis for treatment or other patient management decisions. A negative result may occur with  improper specimen collection/handling, submission of specimen other than nasopharyngeal swab, presence of viral mutation(s) within the areas targeted by this assay, and inadequate number of viral copies(<138 copies/mL). A negative result must be combined with clinical  observations, patient history, and epidemiological information. The expected result is Negative.  Fact Sheet for Patients:  BloggerCourse.com  Fact Sheet for Healthcare Providers:  SeriousBroker.it  This test is no t yet approved or cleared by the [place] FDA and  has been authorized for detection and/or diagnosis of SARS-CoV-2 by FDA under an Emergency Use Authorization (EUA). This EUA will remain  in effect (meaning this test can be used) for the duration of the COVID-19 declaration under Section 564(b)(1) of the Act, 21 U.S.C.section 360bbb-3(b)(1), unless the authorization is terminated  or revoked sooner.       Influenza A by PCR NEGATIVE NEGATIVE Final   Influenza B by PCR NEGATIVE NEGATIVE Final    Comment: (NOTE) The Xpert Xpress SARS-CoV-2/FLU/RSV plus assay is intended as an aid in the diagnosis of influenza from Nasopharyngeal swab specimens and should not be used as a sole basis for treatment. Nasal washings and aspirates are unacceptable for Xpert Xpress SARS-CoV-2/FLU/RSV testing.  Fact Sheet for Patients: BloggerCourse.com  Fact Sheet for Healthcare Providers: SeriousBroker.it  This test is not yet approved or cleared by the [place] FDA and has been authorized for detection and/or diagnosis of SARS-CoV-2 by FDA under an Emergency Use Authorization (EUA). This EUA will remain in effect (meaning this test can be used) for the duration of the COVID-19 declaration under Section 564(b)(1) of the Act, 21 U.S.C. section 360bbb-3(b)(1), unless the authorization is terminated or revoked.  Performed at [facility], [address] [place], [place] [address]      Studies: DG Chest 2 View  Result Date: 02/19/2020 CLINICAL DATA:  Shortness of breath. EXAM: CHEST - 2 VIEW COMPARISON:  October 24, 2019 FINDINGS: The mild prominence of the pulmonary  vasculature is seen. This is slightly more pronounced within the perihilar regions. There is no evidence of acute infiltrate, pleural effusion or pneumothorax. The cardiac silhouette is mildly enlarged. The visualized skeletal structures are unremarkable. IMPRESSION: Mild pulmonary vascular congestion. Electronically Signed   By: Aram Candela M.D.   On: 02/19/2020 22:36    Scheduled Meds: . aspirin  81 mg Oral Daily  . atorvastatin  40 mg Oral q1800  . carvedilol  12.5 mg Oral BID WC  . cyclobenzaprine  5 mg Oral Once  . empagliflozin  10 mg Oral q morning - 10a  . enoxaparin (LOVENOX) injection  90 mg Subcutaneous Q24H  . furosemide  60 mg Intravenous Q12H  . hydrALAZINE  25 mg Oral Q8H  . insulin aspart  0-5 Units Subcutaneous QHS  . insulin aspart  0-9 Units Subcutaneous TID WC  . insulin aspart  3 Units Subcutaneous TID WC  .  insulin glargine  20 Units Subcutaneous QHS  . losartan  25 mg Oral Daily  . sodium chloride flush  3 mL Intravenous Q12H   Continuous Infusions: . sodium chloride      Assessment/Plan:  1.  Acute diastolic congestive heart failure.  Patient on Lasix 60 mg IV twice a day.  Cardiology added Coreg.  Patient on losartan. 2.  Hypertensive urgency.  Patient on Coreg Lasix, hydralazine and losartan and continue to adjust medications. 3.  Acute hypoxic respiratory failure with pulse ox of 87% on room air.  Oxygen supplementation until diuresis complete. 4.  Type 2 diabetes mellitus uncontrolled with hyperlipidemia.  Patient on atorvastatin.  We will add Lantus insulin since hemoglobin A1c elevated at 12.  Continue sliding scale insulin and short acting insulin prior to meals. 5.  Morbid obesity with a BMI of 56.68.  Weight loss needed. 6.  Neck pain more in the lateral neck.  Will give a trial of Flexeril.      Code Status:     Code Status Orders  (From admission, onward)         Start     Ordered   02/20/20 0128  Full code  Continuous         02/20/20 0130        Code Status History    Date Active Date Inactive Code Status Order ID Comments User Context   07/08/2019 1419 07/09/2019 1637 Full Code [id number]  Lorretta Harp, MD ED   10/21/2017 2254 10/28/2017 1857 Full Code [id number]  Axel Filler, MD ED   09/14/2017 1440 09/17/2017 1941 Full Code [id number]  Lovena Neighbours, MD ED   09/06/2017 1929 09/08/2017 2113 Full Code [id number]  Myrene Buddy, MD ED   07/10/2017 1947 07/18/2017 2142 Full Code [id number]  Bobette Mo, MD ED   Advance Care Planning Activity     Family Communication: Left message for patient's sister on the phone Disposition Plan: Status is: Inpatient  Dispo: The patient is from: Home              Anticipated d/c is to: Home              Anticipated d/c date is: Likely will need a few days of diuresis prior to disposition              Patient currently being treated for acute on chronic diastolic congestive heart failure with IV Lasix  Consultants:  Cardiology  Time spent: 35 minutes  Annaliah [employer]

## 2020-02-20 NOTE — Progress Notes (Signed)
Pt c/o RLQ pain. MD made aware/ new orders for CT of the abdomen. Will continue to monitor.

## 2020-02-20 NOTE — H&P (Signed)
[facility]   PATIENT NAME: Jeremiah Vance    MR#:  [id number]  DATE OF BIRTH:  13-Jul-1970  DATE OF ADMISSION:  02/19/2020  PRIMARY CARE PHYSICIAN: [name], NP   REQUESTING/REFERRING PHYSICIAN: Lurline Hare, MD  CHIEF COMPLAINT:   Chief Complaint  Patient presents with  . Shortness of Breath    HISTORY OF PRESENT ILLNESS:  Jeremiah Vance  is a 49 y.o. Caucasian male with a known history of diastolic CHF, stage III chronic kidney disease, hypertension, type 2 diabetes mellitus and dyslipidemia, who presented to the emergency room with acute onset worsening dyspnea over the last couple of days with associated orthopnea and paroxysmal nocturnal dyspnea as well as worsening lower extremity edema.  He admitted to dry cough and wheezing.  No fever or chills.  No dysuria, oliguria or hematuria or flank pain.  He has been taking his medications regularly.  No chest pain or palpitations.  No nausea or vomiting or abdominal pain.  Upon presentation to the emergency room, blood pressure was 191/88 with a heart rate of 24 and pulse currently 94% 1 L 90% on room air that was up to 94% on 2 L O2 via nasal cannula.  Labs revealed a BNP of 27.9 and high-sensitivity troponin I of 37 later 38 blood glucose of 385 with a creatinine of 1.41, albumin of 3.4 and otherwise unremarkable CMP.  CBC was within normal.  Influenza antigens and COVID-19 PCR came back negative.  Two-view chest x-ray showed mild pulmonary vascular congestion.EKG showed normal sinus rhythm with a rate of 87 with left anterior fascicular block with T wave inversion laterally.  The patient was given 1 mg of IV Bumex.  He will be admitted to a progressive unit bed for further evaluation and management.  PAST MEDICAL HISTORY:   Past Medical History:  Diagnosis Date  . Acute ischemic stroke ([place]) 09/06/2017  . Acute renal failure superimposed on stage 3a chronic kidney disease ([place]) 07/08/2019  . Acute right-sided weakness   .  Allergies   . Anasarca   . Anemia 07/10/2017  . Arthritis   . CHF (congestive heart failure) ([place])    "coinsided w/kidney problems I was having 06/2017"  . Chicken pox   . CKD (chronic kidney disease) stage 3, GFR 30-59 ml/min (HCC) 06/2017  . Depression   . Elevated troponin 07/08/2019  . High cholesterol   . History of cardiomyopathy    LVEF 40 to 45% in April 2019 - subsequently normalized  . Hyperbilirubinemia 07/10/2017  . Hypertension   . Ischemic stroke ([place])    Small left internal capsule infarct due to lacunar disease  . Morbid obesity ([place])   . Normocytic anemia 12/16/2017  . 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 Stroke ([place]) 08/2017   "right sided weakness since; getting stronger though" (10/22/2017)  . Type 2 diabetes mellitus ([place])     PAST SURGICAL HISTORY:   Past Surgical History:  Procedure Laterality Date  . ABDOMINAL HERNIA REPAIR  2008; 10/22/2017   "scope; OPEN REPAIR INCARCERATED VENTRAL HERNIA  . HERNIA REPAIR    . KNEE ARTHROSCOPY Right 1989  . VENTRAL HERNIA REPAIR N/A 10/22/2017   Procedure: OPEN REPAIR INCARCERATED VENTRAL HERNIA;  Surgeon: Excell Seltzer, MD;  Location: [facility];  Service: General;  Laterality: N/A;    SOCIAL HISTORY:   Social History   Tobacco Use  . Smoking status: Never Smoker  . Smokeless tobacco: Never Used  Substance Use Topics  . Alcohol use: Not Currently    FAMILY HISTORY:   Family History  Problem Relation Age of Onset  . Diabetes Father   . Heart disease Father   . Arthritis Father   . Hearing loss Father   . Hyperlipidemia Father   . Heart attack Father   . Hypertension Father   . Stroke Father   . Diabetes Sister   . Diabetes Mother   . Stroke Mother   . Arthritis Mother   . Depression Mother   . Heart disease Mother   . Hypertension Mother   . Learning disabilities Mother   . Mental illness Mother   . Diabetes Maternal Grandmother   . Heart disease  Maternal Grandmother   . Depression Maternal Grandmother   . Hyperlipidemia Maternal Grandmother   . Hypertension Maternal Grandmother   . Stroke Maternal Grandmother   . Stroke Paternal Grandfather   . Heart disease Paternal Grandfather   . Arthritis Paternal Grandfather   . Heart attack Paternal Grandfather   . Hypertension Maternal Grandfather   . Hyperlipidemia Maternal Grandfather   . Stroke Maternal Grandfather   . Arthritis Paternal Grandmother   . Hearing loss Paternal Grandmother   . Depression Sister   . Diabetes Sister   . Hypertension Sister   . Mental illness Sister     DRUG ALLERGIES:   Allergies  Allergen Reactions  . Pollen Extract Other (See Comments)    REVIEW OF SYSTEMS:   ROS As per history of present illness. All pertinent systems were reviewed above. Constitutional, HEENT, cardiovascular, respiratory, GI, GU, musculoskeletal, neuro, psychiatric, endocrine, integumentary and hematologic systems were reviewed and are otherwise negative/unremarkable except for positive findings mentioned above in the HPI.   MEDICATIONS AT HOME:   Prior to Admission medications   Medication Sig Start Date End Date Taking? Authorizing Provider  aspirin 81 MG chewable tablet Chew 1 tablet (81 mg total) by mouth daily. 09/20/19  Yes Darylene Price A, FNP  atorvastatin (LIPITOR) 40 MG tablet Take 1 tablet (40 mg total) by mouth daily at 6 PM. 09/20/19  Yes Jackelyn Hoehn, Aura Fey, FNP  blood glucose meter kit and supplies Dispense based on patient and insurance preference. Use up to four times daily as directed. (FOR ICD-10 E10.9, E11.9). 08/03/19  Yes [name], NP  empagliflozin (JARDIANCE) 10 MG TABS tablet Take 1 tablet (10 mg total) by mouth every morning. 09/17/19  Yes [name], NP  losartan (COZAAR) 25 MG tablet Take 1 tablet (25 mg total) by mouth daily. 01/21/20  Yes [name], NP  torsemide (DEMADEX) 20 MG tablet Take 2 tablets (40 mg total) by mouth daily.  09/20/19  Yes Hackney, Tina A, FNP  Semaglutide,0.25 or 0.5MG/DOS, (OZEMPIC, 0.25 OR 0.5 MG/DOSE,) 2 MG/1.5ML SOPN Inject .25 mg into skin one time a week for 4 weeks. At week 5, inject .50 mg into skin one time a week. 01/18/20   [name], NP      VITAL SIGNS:  Blood pressure (!) 155/99, pulse 81, temperature 99.1 F (37.3 C), resp. rate (!) 26, height '5\' 10"'  (1.778 m), weight (!) 179.2 kg, SpO2 93 %.  PHYSICAL EXAMINATION:  Physical Exam  GENERAL:  49 y.o.-year-old Caucasian male patient lying in the bed with no acute distress.  EYES: Pupils equal, round, reactive to light and accommodation. No scleral icterus. Extraocular muscles intact.  HEENT: Head atraumatic, normocephalic. Oropharynx and nasopharynx clear.  NECK:  Supple, no  jugular venous distention. No thyroid enlargement, no tenderness.  LUNGS: Diminished bibasal breath sounds with bibasal rales.   CARDIOVASCULAR: Regular rate and rhythm, S1, S2 normal. No murmurs, rubs, or gallops.  ABDOMEN: Soft, nondistended, nontender. Bowel sounds present. No organomegaly or mass.  EXTREMITIES: 1-2+ bilateral lower extremity edema, with no cyanosis, or clubbing.  NEUROLOGIC: Cranial nerves II through XII are intact. Muscle strength 5/5 in all extremities. Sensation intact. Gait not checked.  PSYCHIATRIC: The patient is alert and oriented x 3.  Normal affect and good eye contact. SKIN: No obvious rash, lesion, or ulcer.   LABORATORY PANEL:   CBC Recent Labs  Lab 02/19/20 2214  WBC 10.3  HGB 14.2  HCT 45.3  PLT 260   ------------------------------------------------------------------------------------------------------------------  Chemistries  Recent Labs  Lab 02/19/20 2214  NA 135  K 4.4  CL 98  CO2 26  GLUCOSE 385*  BUN 17  CREATININE 1.41*  CALCIUM 8.5*  AST 21  ALT 22  ALKPHOS 89  BILITOT 0.8    ------------------------------------------------------------------------------------------------------------------  Cardiac Enzymes No results for input(s): TROPONINI in the last 168 hours. ------------------------------------------------------------------------------------------------------------------  RADIOLOGY:  DG Chest 2 View  Result Date: 02/19/2020 CLINICAL DATA:  Shortness of breath. EXAM: CHEST - 2 VIEW COMPARISON:  October 24, 2019 FINDINGS: The mild prominence of the pulmonary vasculature is seen. This is slightly more pronounced within the perihilar regions. There is no evidence of acute infiltrate, pleural effusion or pneumothorax. The cardiac silhouette is mildly enlarged. The visualized skeletal structures are unremarkable. IMPRESSION: Mild pulmonary vascular congestion. Electronically Signed   By: Virgina Norfolk M.D.   On: 02/19/2020 22:36      IMPRESSION AND PLAN:   1.  Acute on chronic diastolic CHF. -The patient will be admitted to a progressive unit bed. -We will continue diuresis with IV Lasix. -The patient had 2D echo in April of this year revealing an EF of 50 to 55% with grade 2 diastolic dysfunction, mild LVH and mild left atrial dilatation. -Cardiology consult to be obtained. -I notified Dr. Oval Linsey about the patient.  2.  Uncontrolled type 2 diabetes mellitus. -The patient will be placed on supplemental coverage with NovoLog. -We will continue Jardiance and Ozempic basal coverage.  3.  Hypertensive urgency. -We will continue Cozaar and place the patient on as needed IV labetalol.  4.  Dyslipidemia. -Continue statin therapy.  5.  DVT prophylaxis. -Subcutaneous Lovenox.   All the records are reviewed and case discussed with ED provider. The plan of care was discussed in details with the patient (and family). I answered all questions. The patient agreed to proceed with the above mentioned plan. Further management will depend upon hospital  course.   CODE STATUS: Full code  Status is: Inpatient  Remains inpatient appropriate because:Ongoing diagnostic testing needed not appropriate for outpatient work up, Unsafe d/c plan, IV treatments appropriate due to intensity of illness or inability to take PO and Inpatient level of care appropriate due to severity of illness   Dispo: The patient is from: Home              Anticipated d/c is to: Home              Anticipated d/c date is: 2 days              Patient currently is not medically stable to d/c.   TOTAL TIME TAKING CARE OF THIS PATIENT: 55 minutes.    Christel Mormon M.D on 02/20/2020 at 1:41 AM  [facility]   From 7 PM-7 AM, contact night-coverage [email or web address]  CC: Primary care physician; [name], NP

## 2020-02-20 NOTE — Consult Note (Signed)
Cardiology Consultation:   Patient ID: Jeremiah Vance MRN: [id number]; DOB: 03/03/1971  Admit date: 02/19/2020 Date of Consult: 02/20/2020  Primary Care Provider: [name], NP [facility] Cardiologist: Kate Sable, MD  [facility] Electrophysiologist:  None    Patient Profile:   Jeremiah Vance is a 49 y.o. male with a hx of chronic diastolic heart failure, hypertension, morbid obesity, prior stroke, poorly controlled diabetes, CKD 3 who is being seen today for the evaluation of acute on chronic diastolic heart failure at the request of Dr. Sidney Ace.  History of Present Illness:   Jeremiah Vance presented to the ED with 2 days of work stenting of exertional dyspnea and orthopnea. He also had a dry cough and wheeze. He had no fevers or chills. He reported taking all his medications as prescribed. In the ED his blood pressure was 191/88. His oxygen saturation was 90% on room air and improved to 94% on 1 L. BNP was 27.9. High-sensitivity troponin was minimally elevated and flat at 37 and 38. EKG revealed sinus rhythm at 87 bpm. LAFB and lateral T wave inversions.this was unchanged from prior. Glucose was poorly controlled at 385. Chest x-ray was consistent with pulmonary vascular congestion. Covid-19 testing was negative. He was given IV Bumex and admitted to the hospitalist service. Cardiology was consulted for further management of heart failure.  He was [facility] to [facility] 07/2019 with hypertensive urgency. Echo at that time revealed LVEF 50 to 55% with mild LVH and grade 2 diastolic dysfunction. Prior to that he had an echo 06/2017 that revealed LVEF 40 to 45% with diffuse hypokinesis worse in the inferior and inferoseptal myocardium. At that time he was volume overloaded and required diuresis with IV Lasix. Systolic function subsequently improved to 60 to 65% on 08/2017. At that time he was also hospitalized with a small left internal capsule lacunar infarct. He was started on  antiplatelets and a statin. He last saw Dr. Garen Lah on 07/2019 and was doing well. Blood pressure at that visit was 122/70 on carvedilol, hydralazine, and spironolactone.  He is very unclear about what he is taking.  His sister helps him with his medications.  He does not know exactly who changed his medicine since that visit.  Since admission he has been receiving losartan and blood pressure remains poorly controlled.  He notes that he does have a scale but does not weigh himself regularly.   Past Medical History:  Diagnosis Date  . Acute ischemic stroke ([place]) 09/06/2017  . Acute renal failure superimposed on stage 3a chronic kidney disease ([place]) 07/08/2019  . Acute right-sided weakness   . Allergies   . Anasarca   . Anemia 07/10/2017  . Arthritis   . CHF (congestive heart failure) ([place])    "coinsided w/kidney problems I was having 06/2017"  . Chicken pox   . CKD (chronic kidney disease) stage 3, GFR 30-59 ml/min (HCC) 06/2017  . Depression   . Elevated troponin 07/08/2019  . High cholesterol   . History of cardiomyopathy    LVEF 40 to 45% in April 2019 - subsequently normalized  . Hyperbilirubinemia 07/10/2017  . Hypertension   . Ischemic stroke ([place])    Small left internal capsule infarct due to lacunar disease  . Morbid obesity ([place])   . Normocytic anemia 12/16/2017  . 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 Stroke ([place]) 08/2017   "right sided weakness since; getting stronger though" (10/22/2017)  . Type 2 diabetes mellitus (  [facility])     Past Surgical History:  Procedure Laterality Date  . ABDOMINAL HERNIA REPAIR  2008; 10/22/2017   "scope; OPEN REPAIR INCARCERATED VENTRAL HERNIA  . HERNIA REPAIR    . KNEE ARTHROSCOPY Right 1989  . VENTRAL HERNIA REPAIR N/A 10/22/2017   Procedure: OPEN REPAIR INCARCERATED VENTRAL HERNIA;  Surgeon: Excell Seltzer, MD;  Location: [facility];  Service: General;  Laterality: N/A;     Home Medications:  Prior to  Admission medications   Medication Sig Start Date End Date Taking? Authorizing Provider  aspirin 81 MG chewable tablet Chew 1 tablet (81 mg total) by mouth daily. 09/20/19  Yes Darylene Price A, FNP  atorvastatin (LIPITOR) 40 MG tablet Take 1 tablet (40 mg total) by mouth daily at 6 PM. 09/20/19  Yes Jackelyn Hoehn, Aura Fey, FNP  blood glucose meter kit and supplies Dispense based on patient and insurance preference. Use up to four times daily as directed. (FOR ICD-10 E10.9, E11.9). 08/03/19  Yes [name], NP  empagliflozin (JARDIANCE) 10 MG TABS tablet Take 1 tablet (10 mg total) by mouth every morning. 09/17/19  Yes [name], NP  losartan (COZAAR) 25 MG tablet Take 1 tablet (25 mg total) by mouth daily. 01/21/20  Yes [name], NP  torsemide (DEMADEX) 20 MG tablet Take 2 tablets (40 mg total) by mouth daily. 09/20/19  Yes Hackney, Tina A, FNP  Semaglutide,0.25 or 0.5MG/DOS, (OZEMPIC, 0.25 OR 0.5 MG/DOSE,) 2 MG/1.5ML SOPN Inject .25 mg into skin one time a week for 4 weeks. At week 5, inject .50 mg into skin one time a week. 01/18/20   [name], NP    Inpatient Medications: Scheduled Meds: . aspirin  81 mg Oral Daily  . atorvastatin  40 mg Oral q1800  . carvedilol  12.5 mg Oral BID WC  . empagliflozin  10 mg Oral q morning - 10a  . enoxaparin (LOVENOX) injection  90 mg Subcutaneous Q24H  . furosemide  60 mg Intravenous Q12H  . hydrALAZINE  25 mg Oral Q8H  . insulin aspart  0-15 Units Subcutaneous TID PC & HS  . losartan  25 mg Oral Daily  . sodium chloride flush  3 mL Intravenous Q12H   Continuous Infusions: . sodium chloride     PRN Meds: sodium chloride, acetaminophen **OR** acetaminophen, guaiFENesin-dextromethorphan, labetalol, magnesium hydroxide, ondansetron **OR** ondansetron (ZOFRAN) IV, sodium chloride flush, traZODone  Allergies:    Allergies  Allergen Reactions  . Pollen Extract Other (See Comments)    Social History:   Social History    Socioeconomic History  . Marital status: Legally Separated    Spouse name: Not on file  . Number of children: Not on file  . Years of education: Not on file  . Highest education level: Not on file  Occupational History  . Not on file  Tobacco Use  . Smoking status: Never Smoker  . Smokeless tobacco: Never Used  Vaping Use  . Vaping Use: Never used  Substance and Sexual Activity  . Alcohol use: Not Currently  . Drug use: Never  . Sexual activity: Not Currently  Other Topics Concern  . Not on file  Social History Narrative   He lives with his sister , [name] and she is his MPOA after his stokes   Social Determinants of Health   Financial Resource Strain: High Risk  . Difficulty of Paying Living Expenses: Hard  Food Insecurity:   . Worried About Charity fundraiser in the  Last Year: Not on file  . Ran Out of Food in the Last Year: Not on file  Transportation Needs: Unmet Transportation Needs  . Lack of Transportation (Medical): Yes  . Lack of Transportation (Non-Medical): Yes  Physical Activity:   . Days of Exercise per Week: Not on file  . Minutes of Exercise per Session: Not on file  Stress:   . Feeling of Stress : Not on file  Social Connections:   . Frequency of Communication with Friends and Family: Not on file  . Frequency of Social Gatherings with Friends and Family: Not on file  . Attends Religious Services: Not on file  . Active Member of Clubs or Organizations: Not on file  . Attends Archivist Meetings: Not on file  . Marital Status: Not on file  Intimate Partner Violence:   . Fear of Current or Ex-Partner: Not on file  . Emotionally Abused: Not on file  . Physically Abused: Not on file  . Sexually Abused: Not on file    Family History:    Family History  Problem Relation Age of Onset  . Diabetes Father   . Heart disease Father   . Arthritis Father   . Hearing loss Father   . Hyperlipidemia Father   . Heart attack Father   .  Hypertension Father   . Stroke Father   . Diabetes Sister   . Diabetes Mother   . Stroke Mother   . Arthritis Mother   . Depression Mother   . Heart disease Mother   . Hypertension Mother   . Learning disabilities Mother   . Mental illness Mother   . Diabetes Maternal Grandmother   . Heart disease Maternal Grandmother   . Depression Maternal Grandmother   . Hyperlipidemia Maternal Grandmother   . Hypertension Maternal Grandmother   . Stroke Maternal Grandmother   . Stroke Paternal Grandfather   . Heart disease Paternal Grandfather   . Arthritis Paternal Grandfather   . Heart attack Paternal Grandfather   . Hypertension Maternal Grandfather   . Hyperlipidemia Maternal Grandfather   . Stroke Maternal Grandfather   . Arthritis Paternal Grandmother   . Hearing loss Paternal Grandmother   . Depression Sister   . Diabetes Sister   . Hypertension Sister   . Mental illness Sister      ROS:  Please see the history of present illness.  All other ROS reviewed and negative.     Physical Exam/Data:   Vitals:   02/20/20 0317 02/20/20 0446 02/20/20 0821 02/20/20 1156  BP:  (!) 170/90 (!) 172/88 (!) 160/85  Pulse:  66 70 65  Resp:  _0 Temp:  98.9 F (37.2 C) 98.2 F (36.8 C) 97.7 F (36.5 C)  TempSrc:  Oral Oral Oral  SpO2:  94% 96% 95%  Weight: (!) 179.2 kg     Height:        Intake/Output Summary (Last 24 hours) at 02/20/2020 1205 Last data filed at 02/20/2020 0600 Gross per 24 hour  Intake --  Output 1050 ml  Net -1050 ml   Last 3 Weights 02/20/2020 02/19/2020 01/18/2020  Weight (lbs) 395 lb 395 lb 393 lb  Weight (kg) 179.171 kg 179.171 kg 178.264 kg     VS:  BP (!) 160/85 (BP Location: Left Arm)   Pulse 65   Temp 97.7 F (36.5 C) (Oral)   Resp 18   Ht 5' 10" (1.778 m)   Wt (!) 179.2 kg  SpO2 95%   BMI 56.68 kg/m  , BMI Body mass index is 56.68 kg/m. GENERAL:  Well appearing HEENT: Pupils equal round and reactive, fundi not visualized, oral  mucosa unremarkable NECK: Unable to assess JVD due to large beard.   LUNGS:  Clear to auscultation bilaterally HEART:  RRR.  PMI not displaced or sustained,S1 and S2 within normal limits, no S3, no S4, no clicks, no rubs, no murmurs ABD:  Flat, positive bowel sounds normal in frequency in pitch, no bruits, no rebound, no guarding, no midline pulsatile mass, no hepatomegaly, no splenomegaly EXT:  2 plus pulses throughout, 2+ LE edema, no cyanosis no clubbing SKIN:  No rashes no nodules NEURO:  Cranial nerves II through XII grossly intact, motor grossly intact throughout PSYCH:  Cognitively intact, oriented to person place and time   EKG:  The EKG was personally reviewed and demonstrates:  Sinus rhythm.  Rate 87 bpm.  LAFB.  Lateral T wave inversions. Telemetry:  Telemetry was personally reviewed and demonstrates: Sinus rhythm.  5 beats NSVT.  Relevant CV Studies:  Echo 07/09/19:  1. Left ventricular ejection fraction, by estimation, is 50 to 55%. The  left ventricle has low normal function. The left ventricle demonstrates  global hypokinesis. There is mild left ventricular hypertrophy. Left  ventricular diastolic parameters are  consistent with Grade II diastolic dysfunction (pseudonormalization).  2. Right ventricular systolic function is normal. The right ventricular  size is mildly enlarged. Tricuspid regurgitation signal is inadequate for  assessing PA pressure.  3. Left atrial size was mildly dilated.   Laboratory Data:  High Sensitivity Troponin:   Recent Labs  Lab 02/19/20 2214 02/20/20 0028  TROPONINIHS 37* 38*     Chemistry Recent Labs  Lab 02/19/20 2214 02/20/20 0514  NA 135 135  K 4.4 4.1  CL 98 99  CO2 26 25  GLUCOSE 385* 317*  BUN 17 17  CREATININE 1.41* 1.33*  CALCIUM 8.5* 8.5*  GFRNONAA >60 >60  ANIONGAP 11 11    Recent Labs  Lab 02/19/20 2214  PROT 7.3  ALBUMIN 3.4*  AST 21  ALT 22  ALKPHOS 89  BILITOT 0.8   Hematology Recent Labs  Lab  02/19/20 2214 02/20/20 0514  WBC 10.3 9.2  RBC 5.15 5.08  HGB 14.2 14.0  HCT 45.3 44.1  MCV 88.0 86.8  MCH 27.6 27.6  MCHC 31.3 31.7  RDW 13.3 13.3  PLT 260 244   BNP Recent Labs  Lab 02/19/20 2214  BNP 267.9*    DDimer No results for input(s): DDIMER in the last 168 hours.   Radiology/Studies:  DG Chest 2 View  Result Date: 02/19/2020 CLINICAL DATA:  Shortness of breath. EXAM: CHEST - 2 VIEW COMPARISON:  October 24, 2019 FINDINGS: The mild prominence of the pulmonary vasculature is seen. This is slightly more pronounced within the perihilar regions. There is no evidence of acute infiltrate, pleural effusion or pneumothorax. The cardiac silhouette is mildly enlarged. The visualized skeletal structures are unremarkable. IMPRESSION: Mild pulmonary vascular congestion. Electronically Signed   By: Virgina Norfolk M.D.   On: 02/19/2020 22:36     Assessment and Plan:   # Acute diastolic heart failure:  # Hypertensive urgency: Mr. Villada presents volume overloaded and with his blood pressure poorly controlled.  It is unclear exactly what he is taking.  His medicine regimen on admission was certainly different than when he was last seen by cardiology in May.  At that time his blood pressure was well-controlled.  We will resume his carvedilol and hydralazine.  Continue losartan and monitor renal function closely.  I suspect 3 days times a day medications may be difficult for him to manage.  Continue with diuresis as ordered by the primary team.  We will need to work with him on making sure to weigh himself each day and limit sodium intake.  # Morbid obesity:  Will benefit from structured outpatient weight loss program.  # Prior stroke:  Continue aspirin and atorvastatin.  # DM:  A1c 12% on admission.  Consider for Farxiga for diabetes and renal dysfunction.  # CKD 3:   Monitor renal function as above with losartan and active diuresis.          For questions or updates,  please contact [place] Please consult [email or web address] for contact info under    Signed, Skeet Latch, MD  02/20/2020 12:05 PM

## 2020-02-20 NOTE — Progress Notes (Signed)
Pt called out stating he had vomited x1, digested food appearance, felt nauseous prior to that. Zofran administered per orders. Will continue to monitor.

## 2020-02-20 NOTE — Progress Notes (Signed)
Contrast was adminsitere to pt. Notify CT deparment. Will continue to monitor.

## 2020-02-20 NOTE — Progress Notes (Signed)
Anticoagulation monitoring(Lovenox):  49 yo male ordered Lovenox 40 mg Q24h  Filed Weights   02/19/20 2203  Weight: (!) 179.2 kg (395 lb)   BMI 57    Lab Results  Component Value Date   CREATININE 1.41 (H) 02/19/2020   CREATININE 1.28 01/18/2020   CREATININE 1.69 (H) 10/24/2019   Estimated Creatinine Clearance: 102.4 mL/min (A) (by C-G formula based on SCr of 1.41 mg/dL (H)). Hemoglobin & Hematocrit     Component Value Date/Time   HGB 14.2 02/19/2020 2214   HGB 12.0 (L) 06/17/2018 1455   HCT 45.3 02/19/2020 2214   HCT 34.9 (L) 06/17/2018 1455     Per Protocol for Patient with estCrcl > 30 ml/min and BMI > 40, will transition to Lovenox 90 mg Q24h.

## 2020-02-21 ENCOUNTER — Ambulatory Visit: Payer: Self-pay | Admitting: Nurse Practitioner

## 2020-02-21 DIAGNOSIS — K439 Ventral hernia without obstruction or gangrene: Secondary | ICD-10-CM

## 2020-02-21 DIAGNOSIS — K7469 Other cirrhosis of liver: Secondary | ICD-10-CM

## 2020-02-21 LAB — BASIC METABOLIC PANEL
Anion gap: 10 (ref 5–15)
BUN: 21 mg/dL — ABNORMAL HIGH (ref 6–20)
CO2: 30 mmol/L (ref 22–32)
Calcium: 8 mg/dL — ABNORMAL LOW (ref 8.9–10.3)
Chloride: 96 mmol/L — ABNORMAL LOW (ref 98–111)
Creatinine, Ser: 1.56 mg/dL — ABNORMAL HIGH (ref 0.61–1.24)
GFR, Estimated: 54 mL/min — ABNORMAL LOW (ref >60)
Glucose, Bld: 225 mg/dL — ABNORMAL HIGH (ref 70–99)
Potassium: 4.1 mmol/L (ref 3.5–5.1)
Sodium: 136 mmol/L (ref 135–145)

## 2020-02-21 LAB — HEPATITIS B SURFACE ANTIGEN: Hepatitis B Surface Ag: NONREACTIVE

## 2020-02-21 LAB — HEPATITIS B CORE ANTIBODY, TOTAL: Hep B Core Total Ab: NONREACTIVE

## 2020-02-21 LAB — GLUCOSE, CAPILLARY
Glucose-Capillary: 272 mg/dL — ABNORMAL HIGH (ref 70–99)
Glucose-Capillary: 195 mg/dL — ABNORMAL HIGH (ref 70–99)
Glucose-Capillary: 132 mg/dL — ABNORMAL HIGH (ref 70–99)
Glucose-Capillary: 200 mg/dL — ABNORMAL HIGH (ref 70–99)

## 2020-02-21 LAB — HEPATITIS C ANTIBODY: HCV Ab: NONREACTIVE

## 2020-02-21 MED ORDER — INSULIN GLARGINE 100 UNIT/ML ~~LOC~~ SOLN
35.0000 [IU] | Freq: Every day | SUBCUTANEOUS | Status: DC
  Administered 2020-02-21 – 2020-02-25 (×5): 35 [IU] via SUBCUTANEOUS
  Filled 2020-02-21 (×6): qty 0.35

## 2020-02-21 MED ORDER — CARVEDILOL 6.25 MG PO TABS
18.7500 mg | ORAL_TABLET | Freq: Two times a day (BID) | ORAL | Status: DC
  Administered 2020-02-21 – 2020-02-25 (×8): 18.75 mg via ORAL
  Filled 2020-02-21 (×8): qty 1

## 2020-02-21 MED ORDER — INSULIN GLARGINE 100 UNIT/ML ~~LOC~~ SOLN
15.0000 [IU] | Freq: Once | SUBCUTANEOUS | Status: AC
  Administered 2020-02-21: 15 [IU] via SUBCUTANEOUS
  Filled 2020-02-21: qty 0.15

## 2020-02-21 MED ORDER — BENZONATATE 100 MG PO CAPS
200.0000 mg | ORAL_CAPSULE | Freq: Three times a day (TID) | ORAL | Status: DC | PRN
  Administered 2020-02-21 – 2020-02-22 (×4): 200 mg via ORAL
  Filled 2020-02-21 (×4): qty 2

## 2020-02-21 MED ORDER — LOSARTAN POTASSIUM 50 MG PO TABS
50.0000 mg | ORAL_TABLET | Freq: Every day | ORAL | Status: DC
  Administered 2020-02-22 – 2020-02-26 (×5): 50 mg via ORAL
  Filled 2020-02-21 (×5): qty 1

## 2020-02-21 MED ORDER — INSULIN ASPART 100 UNIT/ML ~~LOC~~ SOLN
5.0000 [IU] | Freq: Three times a day (TID) | SUBCUTANEOUS | Status: DC
  Administered 2020-02-21 – 2020-02-26 (×15): 5 [IU] via SUBCUTANEOUS
  Filled 2020-02-21 (×15): qty 1

## 2020-02-21 MED ORDER — FUROSEMIDE 10 MG/ML IJ SOLN
40.0000 mg | Freq: Two times a day (BID) | INTRAMUSCULAR | Status: DC
  Administered 2020-02-21 – 2020-02-23 (×4): 40 mg via INTRAVENOUS
  Filled 2020-02-21 (×4): qty 4

## 2020-02-21 NOTE — Progress Notes (Signed)
Patient ID: Jeremiah Vance, male   DOB: March 15, 1971, 49 y.o.   MRN: [id number] [facility] PROGRESS NOTE  RENLEY BAROUSSE POI:[id number] DOB: 23-Nov-1970 DOA: 02/19/2020 PCP: Theadore Nan, NP  HPI/Subjective: Patient has some lower abdominal pain but better than last night.  He initially came in with lower abdominal pain and found to be in heart failure.  Patient still short of breath.  Urinating well.  Short of breath with ambulating back to the bathroom.  Objective: Vitals:   02/21/20 1147 02/21/20 1545  BP: 129/74 132/84  Pulse: 63 60  Resp: 17 17  Temp: 98.4 F (36.9 C) 98.6 F (37 C)  SpO2: 95% 96%    Filed Weights   02/19/20 2203 02/20/20 0317 02/21/20 0421  Weight: (!) 179.2 kg (!) 179.2 kg (!) 175.5 kg    ROS: Review of Systems  Respiratory: Negative for cough and shortness of breath.   Cardiovascular: Negative for chest pain.  Gastrointestinal: Negative for abdominal pain, nausea and vomiting.   Exam: Physical Exam HENT:     Head: Normocephalic.     Mouth/Throat:     Pharynx: No oropharyngeal exudate.  Eyes:     General: Lids are normal.     Conjunctiva/sclera: Conjunctivae normal.     Pupils: Pupils are equal, round, and reactive to light.  Cardiovascular:     Rate and Rhythm: Normal rate and regular rhythm.     Heart sounds: Normal heart sounds, S1 normal and S2 normal.  Pulmonary:     Breath sounds: Examination of the right-lower field reveals decreased breath sounds and rales. Examination of the left-lower field reveals decreased breath sounds and rales. Decreased breath sounds and rales present. No wheezing or rhonchi.  Abdominal:     Palpations: Abdomen is soft.     Tenderness: There is no abdominal tenderness.     Hernia: A hernia is present. Hernia is present in the ventral area.  Musculoskeletal:     Right ankle: Swelling present.     Left ankle: Swelling present.  Skin:    General: Skin is warm.     Comments: Chronic pinkish  discoloration bilateral lower extremity.  Small ulceration left leg.  Neurological:     Mental Status: He is alert and oriented to person, place, and time.       Data Reviewed: Basic Metabolic Panel: Recent Labs  Lab 02/19/20 2214 02/20/20 0028 02/20/20 0514 02/21/20 0415  NA 135  --  135 136  K 4.4  --  4.1 4.1  CL 98  --  99 96*  CO2 26  --  25 30  GLUCOSE 385*  --  317* 225*  BUN 17  --  17 21*  CREATININE 1.41*  --  1.33* 1.56*  CALCIUM 8.5*  --  8.5* 8.0*  MG  --  2.0  --   --    Liver Function Tests: Recent Labs  Lab 02/19/20 2214  AST 21  ALT 22  ALKPHOS 89  BILITOT 0.8  PROT 7.3  ALBUMIN 3.4*   CBC: Recent Labs  Lab 02/19/20 2214 02/20/20 0514  WBC 10.3 9.2  HGB 14.2 14.0  HCT 45.3 44.1  MCV 88.0 86.8  PLT 260 244   BNP (last 3 results) Recent Labs    07/08/19 0938 02/19/20 2214  BNP 219.0* 267.9*    CBG: Recent Labs  Lab 02/20/20 1157 02/20/20 1718 02/20/20 2127 02/21/20 0844 02/21/20 1148  GLUCAP 262* 269* 255* 272* 195*  Recent Results (from the past 240 hour(s))  Resp Panel by RT-PCR (Flu A&B, Covid) Nasopharyngeal Swab     Status: None   Collection Time: 02/19/20 11:28 PM   Specimen: Nasopharyngeal Swab; Nasopharyngeal(NP) swabs in vial transport medium  Result Value Ref Range Status   SARS Coronavirus 2 by RT PCR NEGATIVE NEGATIVE Final    Comment: (NOTE) SARS-CoV-2 target nucleic acids are NOT DETECTED.  The SARS-CoV-2 RNA is generally detectable in upper respiratory specimens during the acute phase of infection. The lowest concentration of SARS-CoV-2 viral copies this assay can detect is 138 copies/mL. A negative result does not preclude SARS-Cov-2 infection and should not be used as the sole basis for treatment or other patient management decisions. A negative result may occur with  improper specimen collection/handling, submission of specimen other than nasopharyngeal swab, presence of viral mutation(s) within  the areas targeted by this assay, and inadequate number of viral copies(<138 copies/mL). A negative result must be combined with clinical observations, patient history, and epidemiological information. The expected result is Negative.  Fact Sheet for Patients:  BloggerCourse.com  Fact Sheet for Healthcare Providers:  SeriousBroker.it  This test is no t yet approved or cleared by the [place] FDA and  has been authorized for detection and/or diagnosis of SARS-CoV-2 by FDA under an Emergency Use Authorization (EUA). This EUA will remain  in effect (meaning this test can be used) for the duration of the COVID-19 declaration under Section 564(b)(1) of the Act, 21 U.S.C.section 360bbb-3(b)(1), unless the authorization is terminated  or revoked sooner.       Influenza A by PCR NEGATIVE NEGATIVE Final   Influenza B by PCR NEGATIVE NEGATIVE Final    Comment: (NOTE) The Xpert Xpress SARS-CoV-2/FLU/RSV plus assay is intended as an aid in the diagnosis of influenza from Nasopharyngeal swab specimens and should not be used as a sole basis for treatment. Nasal washings and aspirates are unacceptable for Xpert Xpress SARS-CoV-2/FLU/RSV testing.  Fact Sheet for Patients: BloggerCourse.com  Fact Sheet for Healthcare Providers: SeriousBroker.it  This test is not yet approved or cleared by the [place] FDA and has been authorized for detection and/or diagnosis of SARS-CoV-2 by FDA under an Emergency Use Authorization (EUA). This EUA will remain in effect (meaning this test can be used) for the duration of the COVID-19 declaration under Section 564(b)(1) of the Act, 21 U.S.C. section 360bbb-3(b)(1), unless the authorization is terminated or revoked.  Performed at [facility], [address] [place], [place] [address]      Studies: CT ABDOMEN PELVIS WO  CONTRAST  Result Date: 02/20/2020 CLINICAL DATA:  New onset right lower quadrant pain EXAM: CT ABDOMEN AND PELVIS WITHOUT CONTRAST TECHNIQUE: Multidetector CT imaging of the abdomen and pelvis was performed following the standard protocol without IV contrast. COMPARISON:  October 21, 2017 FINDINGS: Lower chest: There is a stable 5 mm pulmonary nodule in the right lower lobe. There is a mild amount of atelectasis at the lung bases.The heart is enlarged. Hepatobiliary: The liver surface appears nodular, although it is poorly evaluated secondary to respiratory motion artifact. Cholelithiasis without acute inflammation.There is no biliary ductal dilation. Pancreas: Normal contours without ductal dilatation. No peripancreatic fluid collection. Spleen: The spleen is enlarged measuring 14 cm craniocaudad. Adrenals/Urinary Tract: --Adrenal glands: There is a small fat containing left adrenal myelolipoma. --Right kidney/ureter: No hydronephrosis or radiopaque kidney stones. --Left kidney/ureter: No hydronephrosis or radiopaque kidney stones. --Urinary bladder: Unremarkable. Stomach/Bowel: --Stomach/Duodenum: No hiatal hernia or other gastric abnormality.  Normal duodenal course and caliber. --Small bowel: Unremarkable. --Colon: Unremarkable. --Appendix: Normal. Vascular/Lymphatic: Normal course and caliber of the major abdominal vessels. --No retroperitoneal lymphadenopathy. --No mesenteric lymphadenopathy. --No pelvic or inguinal lymphadenopathy. Reproductive: Unremarkable Other: No ascites or free air. There is a large low midline abdominal wall hernia. This hernia contains loops of small bowel and a small amount of free fluid without evidence for an obstruction. Musculoskeletal. No acute displaced fractures. IMPRESSION: 1. No acute abdominopelvic abnormality. 2. Large low midline abdominal wall hernia containing loops of small bowel and a small amount of free fluid without evidence for an obstruction. 3. Cholelithiasis  without acute inflammation. 4. Nodular liver surface with splenomegaly, suggestive of cirrhosis. Enlarged spleen suggestive of portal hypertension. 5. Cardiomegaly. Aortic Atherosclerosis (ICD10-I70.0). Electronically Signed   By: Katherine Mantle M.D.   On: 02/20/2020 23:55   DG Chest 2 View  Result Date: 02/19/2020 CLINICAL DATA:  Shortness of breath. EXAM: CHEST - 2 VIEW COMPARISON:  October 24, 2019 FINDINGS: The mild prominence of the pulmonary vasculature is seen. This is slightly more pronounced within the perihilar regions. There is no evidence of acute infiltrate, pleural effusion or pneumothorax. The cardiac silhouette is mildly enlarged. The visualized skeletal structures are unremarkable. IMPRESSION: Mild pulmonary vascular congestion. Electronically Signed   By: Aram Candela M.D.   On: 02/19/2020 22:36    Scheduled Meds: . aspirin  81 mg Oral Daily  . atorvastatin  40 mg Oral q1800  . carvedilol  18.75 mg Oral BID WC  . empagliflozin  10 mg Oral q morning - 10a  . enoxaparin (LOVENOX) injection  90 mg Subcutaneous Q24H  . furosemide  40 mg Intravenous Q12H  . insulin aspart  0-5 Units Subcutaneous QHS  . insulin aspart  0-9 Units Subcutaneous TID WC  . insulin aspart  5 Units Subcutaneous TID WC  . insulin glargine  35 Units Subcutaneous QHS  . [START ON 02/22/2020] losartan  50 mg Oral Daily  . sodium chloride flush  3 mL Intravenous Q12H   Continuous Infusions: . sodium chloride      Assessment/Plan:  1. Acute diastolic congestive heart failure.  Continue Lasix but decrease dose down to 40 mg IV twice daily.  Patient on Coreg, losartan.  Cardiology started [place]. 2. Hypertensive urgency on presentation blood pressure much better on Coreg, IV Lasix and losartan. 3. Acute hypoxic respiratory failure with pulse ox 87% on room air on presentation.  Will check pulse ox on room air with ambulation tomorrow morning. 4. Type 2 diabetes mellitus uncontrolled with  hyperlipidemia.  Continue atorvastatin.  Increase Lantus insulin to 35 units daily.  Increase short acting insulin prior to meals to 5 units plus sliding scale.  Hemoglobin A1c very elevated at 12.0. 5. Morbid obesity with a BMI of 55.53.  Continue diuresis. 6. Neck pain.  Trial Flexeril 7. Possibility of cirrhosis seen on CT scan of the abdomen.  Hepatitis B surface antigen and hepatitis B core antibody negative.  Hepatitis C antibody negative. 8. Large ventral hernia with loops of small bowel but no obstruction or incarceration seen.  Patient did have surgery in [place] I did recommend getting back to that surgeon for evaluation as outpatient once respiratory status better.     Code Status:     Code Status Orders  (From admission, onward)         Start     Ordered   02/20/20 0128  Full code  Continuous  02/20/20 0130        Code Status History    Date Active Date Inactive Code Status Order ID Comments User Context   07/08/2019 1419 07/09/2019 1637 Full Code [id number]  Lorretta Harp, MD ED   10/21/2017 2254 10/28/2017 1857 Full Code [id number]  Axel Filler, MD ED   09/14/2017 1440 09/17/2017 1941 Full Code [id number]  Lovena Neighbours, MD ED   09/06/2017 1929 09/08/2017 2113 Full Code [id number]  Myrene Buddy, MD ED   07/10/2017 1947 07/18/2017 2142 Full Code [id number]  Bobette Mo, MD ED   Advance Care Planning Activity     Family Communication: Tried to reach sister on the phone on cell phone but no answer.  Tried to call patient in the room to see if her sister was there but no answer. Disposition Plan: Status is: Inpatient  Dispo: The patient is from: Home              Anticipated d/c is to: Home              Anticipated d/c date is: Cardiology is thinking he will need a few more days of IV diuresis prior to disposition              Patient currently being treated for acute diastolic congestive heart failure on IV Lasix  Time spent: 28 minutes  Jane Broughton  [employer]

## 2020-02-21 NOTE — Progress Notes (Signed)
Mobility Specialist - Progress Note   02/21/20 1500  Mobility  Activity Ambulated in room  Level of Assistance Standby assist, set-up cues, supervision of patient - no hands on  Distance Ambulated (ft) 50 ft  Mobility Response Tolerated well  Mobility performed by Mobility specialist  $Mobility charge 1 Mobility    Pre-mobility: 61 HR, 94% SpO2 During-mobility: 71 HR, 93% SpO2   Pt was sleeping in bed upon arrival utilizing 3L [place] O2. Pt was awakened by voice and agreed to session. Pt c/o pain in abdomen, nausea, and fatigue. Pt's O2 was doffed prior to activity as he stated he was not using O2 PTA and agreeable to trial ambulation without it. Pt was able to get EOB independently with extra time. Pt ambulated 50' in room without AD. No LOB noted. Several rest breaks taken to manage pain. Pt stated that activity increased pain to a 9. O2 > 92% during ambulation. Upon return to EOB, pt c/o feeling tired. Pt rated RPE this session a 6/10. Overall, pt tolerated session well. Pt was left EOB with needs in reach and O2 donned on. Family entered room at the end of session. Nurse notified.    Filiberto Pinks Mobility Specialist 02/21/20, 3:50 PM

## 2020-02-21 NOTE — Progress Notes (Signed)
Progress Note  Patient Name: Jeremiah Vance Date of Encounter: 02/21/2020  Primary Cardiologist: Debbe Odea, MD  Subjective   Coughing a lot this AM.  Nonproductive.  Ongoing orthopnea/edema.  Abdominal muscles hurt from coughing so much.  Inpatient Medications    Scheduled Meds:  aspirin  81 mg Oral Daily   atorvastatin  40 mg Oral q1800   carvedilol  12.5 mg Oral BID WC   empagliflozin  10 mg Oral q morning - 10a   enoxaparin (LOVENOX) injection  90 mg Subcutaneous Q24H   furosemide  40 mg Intravenous Q12H   hydrALAZINE  25 mg Oral Q8H   insulin aspart  0-5 Units Subcutaneous QHS   insulin aspart  0-9 Units Subcutaneous TID WC   insulin aspart  5 Units Subcutaneous TID WC   insulin glargine  15 Units Subcutaneous Once   insulin glargine  35 Units Subcutaneous QHS   losartan  25 mg Oral Daily   sodium chloride flush  3 mL Intravenous Q12H   Continuous Infusions:  sodium chloride     PRN Meds: sodium chloride, acetaminophen **OR** acetaminophen, benzonatate, cyclobenzaprine, labetalol, magnesium hydroxide, ondansetron **OR** ondansetron (ZOFRAN) IV, sodium chloride flush, traZODone   Vital Signs    Vitals:   02/20/20 2128 02/21/20 0421 02/21/20 0737 02/21/20 0830  BP: 130/68 125/63 118/68 139/76  Pulse: 60 72 73 76  Resp:  16 18 18   Temp: 98.5 F (36.9 C) 99.4 F (37.4 C) 99.6 F (37.6 C) 98.7 F (37.1 C)  TempSrc: Oral Oral  Oral  SpO2: 96% 93% 91% 91%  Weight:  (!) 175.5 kg    Height:        Intake/Output Summary (Last 24 hours) at 02/21/2020 1130 Last data filed at 02/21/2020 0656 Gross per 24 hour  Intake 960 ml  Output 1450 ml  Net -490 ml   Filed Weights   02/19/20 2203 02/20/20 0317 02/21/20 0421  Weight: (!) 179.2 kg (!) 179.2 kg (!) 175.5 kg    Physical Exam   GEN: obese, in no acute distress.  HEENT: Grossly normal.  Neck: Supple, obese, difficult to gauge JVP.  No carotid bruits, or masses. Cardiac: RRR, no  murmurs, rubs, or gallops. No clubbing, cyanosis.  2+ bilat LE edema to knees.  Lower ext tender.  Radials 2+, DP/PT 1+ and equal bilaterally.  Respiratory:  Respirations regular and unlabored, Crackles throughout bilat. GI: Obese, semi-firm, diffusely tender, BS + x 4. MS: no deformity or atrophy. Skin: warm and dry, no rash. Lower ext erythematous/tender. Neuro:  Strength and sensation are intact. Psych: AAOx3.  Flat affect.  Labs    Chemistry Recent Labs  Lab 02/19/20 2214 02/20/20 0514 02/21/20 0415  NA 135 135 136  K 4.4 4.1 4.1  CL 98 99 96*  CO2 26 25 30   GLUCOSE 385* 317* 225*  BUN 17 17 21*  CREATININE 1.41* 1.33* 1.56*  CALCIUM 8.5* 8.5* 8.0*  PROT 7.3  --   --   ALBUMIN 3.4*  --   --   AST 21  --   --   ALT 22  --   --   ALKPHOS 89  --   --   BILITOT 0.8  --   --   GFRNONAA >60 >60 54*  ANIONGAP 11 11 10      Hematology Recent Labs  Lab 02/19/20 2214 02/20/20 0514  WBC 10.3 9.2  RBC 5.15 5.08  HGB 14.2 14.0  HCT 45.3 44.1  MCV  88.0 86.8  MCH 27.6 27.6  MCHC 31.3 31.7  RDW 13.3 13.3  PLT 260 244    Cardiac Enzymes  Recent Labs  Lab 02/19/20 2214 02/20/20 0028  TROPONINIHS 37* 38*      BNP Recent Labs  Lab 02/19/20 2214  BNP 267.9*     Lipids  Lab Results  Component Value Date   CHOL 138 01/18/2020   HDL 35.80 (L) 01/18/2020   LDLCALC 85 01/18/2020   TRIG 88.0 01/18/2020   CHOLHDL 4 01/18/2020    HbA1c  Lab Results  Component Value Date   HGBA1C 12.0 (H) 02/20/2020    Radiology    CT ABDOMEN PELVIS WO CONTRAST  Result Date: 02/20/2020 CLINICAL DATA:  New onset right lower quadrant pain EXAM: CT ABDOMEN AND PELVIS WITHOUT CONTRAST TECHNIQUE: Multidetector CT imaging of the abdomen and pelvis was performed following the standard protocol without IV contrast. COMPARISON:  October 21, 2017 FINDINGS: Lower chest: There is a stable 5 mm pulmonary nodule in the right lower lobe. There is a mild amount of atelectasis at the lung  bases.The heart is enlarged. Hepatobiliary: The liver surface appears nodular, although it is poorly evaluated secondary to respiratory motion artifact. Cholelithiasis without acute inflammation.There is no biliary ductal dilation. Pancreas: Normal contours without ductal dilatation. No peripancreatic fluid collection. Spleen: The spleen is enlarged measuring 14 cm craniocaudad. Adrenals/Urinary Tract: --Adrenal glands: There is a small fat containing left adrenal myelolipoma. --Right kidney/ureter: No hydronephrosis or radiopaque kidney stones. --Left kidney/ureter: No hydronephrosis or radiopaque kidney stones. --Urinary bladder: Unremarkable. Stomach/Bowel: --Stomach/Duodenum: No hiatal hernia or other gastric abnormality. Normal duodenal course and caliber. --Small bowel: Unremarkable. --Colon: Unremarkable. --Appendix: Normal. Vascular/Lymphatic: Normal course and caliber of the major abdominal vessels. --No retroperitoneal lymphadenopathy. --No mesenteric lymphadenopathy. --No pelvic or inguinal lymphadenopathy. Reproductive: Unremarkable Other: No ascites or free air. There is a large low midline abdominal wall hernia. This hernia contains loops of small bowel and a small amount of free fluid without evidence for an obstruction. Musculoskeletal. No acute displaced fractures. IMPRESSION: 1. No acute abdominopelvic abnormality. 2. Large low midline abdominal wall hernia containing loops of small bowel and a small amount of free fluid without evidence for an obstruction. 3. Cholelithiasis without acute inflammation. 4. Nodular liver surface with splenomegaly, suggestive of cirrhosis. Enlarged spleen suggestive of portal hypertension. 5. Cardiomegaly. Aortic Atherosclerosis (ICD10-I70.0). Electronically Signed   By: Katherine Mantle M.D.   On: 02/20/2020 23:55   DG Chest 2 View  Result Date: 02/19/2020 CLINICAL DATA:  Shortness of breath. EXAM: CHEST - 2 VIEW COMPARISON:  October 24, 2019 FINDINGS: The mild  prominence of the pulmonary vasculature is seen. This is slightly more pronounced within the perihilar regions. There is no evidence of acute infiltrate, pleural effusion or pneumothorax. The cardiac silhouette is mildly enlarged. The visualized skeletal structures are unremarkable. IMPRESSION: Mild pulmonary vascular congestion. Electronically Signed   By: Aram Candela M.D.   On: 02/19/2020 22:36    Telemetry    RSR - Personally Reviewed  Cardiac Studies   2D Echocardiogram 4.9.2021  1. Left ventricular ejection fraction, by estimation, is 50 to 55%. The  left ventricle has low normal function. The left ventricle demonstrates  global hypokinesis. There is mild left ventricular hypertrophy. Left  ventricular diastolic parameters are  consistent with Grade II diastolic dysfunction (pseudonormalization).  2. Right ventricular systolic function is normal. The right ventricular  size is mildly enlarged. Tricuspid regurgitation signal is inadequate for  assessing PA pressure.  3. Left atrial size was mildly dilated.   Patient Profile     49 y.o. ? with a hx of chronic diastolic heart failure, hypertension, morbid obesity, prior stroke, poorly controlled diabetes, and CKD 3, who was admitted 11/20 with hypertensive urgency (191/88) and CHF.  Assessment & Plan    1.  Hypertensive Urgency:  BPs now stable on  blocker, ARB, and hydralazine.  I'm concerned about his future compliance w/ TID hydralazine.  I will titrate carvedilol for now, but with low-nl EF, CHF, and HTN, he'd likely benefit from transitioning losartan to entrest0 (or at least valsartan), if he could afford it.  2.  Acute on chronic HF w/ preserved EF:  EF 50-55% by echo in 07/2019. Admission wt ~ 12 kg above wts from earlier this year.  He has responded well to IV lasix and was minus 750 ml yesterday and minus 1800 for admission.  Wt down 4 kg since admission, but still up 8 kg since July.  He remains markedly volume  overloaded. HR/BP stable.  BUN/Creat/Bicarb up sl @ 21/1.56/30.  Lasix dose reduced from 60 BID to 40 IV BID.  May need to consider metolazone.  3.  Poorly controlled DMII:  A1c 12.  Noncompliant @ home.  Insulin mgmt per IM.  4.  CKD III:  As above, sl worsening of renal fxn in setting of diuresis.  Follow.  Signed, Nicolasa Ducking, NP  02/21/2020, 11:30 AM    For questions or updates, please contact   Please consult [email or web address] for contact info under Cardiology/STEMI.

## 2020-02-21 NOTE — Progress Notes (Signed)
Inpatient Diabetes Program Recommendations  AACE/ADA: New Consensus Statement on Inpatient Glycemic Control (2015)  Target Ranges:  Prepandial:   less than 140 mg/dL      Peak postprandial:   less than 180 mg/dL (1-2 hours)      Critically ill patients:  140 - 180 mg/dL   Lab Results  Component Value Date   GLUCAP 272 (H) 02/21/2020   HGBA1C 12.0 (H) 02/20/2020    Review of Glycemic Control Results for Jeremiah Vance, Jeremiah Vance" (MRN [id number]) as of 02/21/2020 11:10  Ref. Range 02/20/2020 07:59 02/20/2020 11:57 02/20/2020 17:18 02/20/2020 21:27 02/21/2020 08:44  Glucose-Capillary Latest Ref Range: 70 - 99 mg/dL 938 (H) 182 (H) 993 (H) 255 (H) 272 (H)   Diabetes history: DM2 Outpatient Diabetes medications: Ozempic .25 mg q week + Jardiance 10 mg qd Current orders for Inpatient glycemic control: Lantus 20 units qd + Jardiance 10 mg qd + Novolog 3 units tid meal coverage + Novolog sensitive tid + hs 0-5 units  Inpatient Diabetes Program Recommendations:   -Increase Lantus to 35 units daily (give additional 15 units to todays current dose of 25 units) -Increase Novolog meal coverage to 5 units tid if eats 50% Secure chat sent to Dr. Renae Gloss.  Thank you, Jeremiah Vance. Aleene Swanner, RN, MSN, CDE  Diabetes Coordinator Inpatient Glycemic Control Team Team Pager [phone] (8am-5pm) 02/21/2020 11:16 AM

## 2020-02-21 NOTE — Progress Notes (Addendum)
Pt is complaining of cough administered robitussin but per pt did not help. Notify NP Jon Billings. Will continue to monitor.  Update 0634: NP Jon Billings ordered tessalon 200 mg capsule three times a day PRN for cough. Will continue to monitor.

## 2020-02-21 NOTE — Plan of Care (Signed)
  Problem: Education: Goal: Knowledge of General Education information will improve Description: Including pain rating scale, medication(s)/side effects and non-pharmacologic comfort measures Outcome: Progressing   Problem: Clinical Measurements: Goal: Respiratory complications will improve Outcome: Progressing   Problem: Activity: Goal: Risk for activity intolerance will decrease Outcome: Progressing   Problem: Pain Managment: Goal: General experience of comfort will improve Outcome: Progressing   Problem: Safety: Goal: Ability to remain free from injury will improve Outcome: Progressing   

## 2020-02-22 ENCOUNTER — Inpatient Hospital Stay: Payer: Self-pay

## 2020-02-22 DIAGNOSIS — R059 Cough, unspecified: Secondary | ICD-10-CM

## 2020-02-22 DIAGNOSIS — N189 Chronic kidney disease, unspecified: Secondary | ICD-10-CM

## 2020-02-22 DIAGNOSIS — N179 Acute kidney failure, unspecified: Secondary | ICD-10-CM

## 2020-02-22 LAB — BASIC METABOLIC PANEL
Anion gap: 11 (ref 5–15)
BUN: 30 mg/dL — ABNORMAL HIGH (ref 6–20)
CO2: 30 mmol/L (ref 22–32)
Calcium: 8 mg/dL — ABNORMAL LOW (ref 8.9–10.3)
Chloride: 95 mmol/L — ABNORMAL LOW (ref 98–111)
Creatinine, Ser: 1.79 mg/dL — ABNORMAL HIGH (ref 0.61–1.24)
GFR, Estimated: 46 mL/min — ABNORMAL LOW (ref >60)
Glucose, Bld: 211 mg/dL — ABNORMAL HIGH (ref 70–99)
Potassium: 3.9 mmol/L (ref 3.5–5.1)
Sodium: 136 mmol/L (ref 135–145)

## 2020-02-22 LAB — GLUCOSE, CAPILLARY
Glucose-Capillary: 185 mg/dL — ABNORMAL HIGH (ref 70–99)
Glucose-Capillary: 175 mg/dL — ABNORMAL HIGH (ref 70–99)
Glucose-Capillary: 234 mg/dL — ABNORMAL HIGH (ref 70–99)
Glucose-Capillary: 224 mg/dL — ABNORMAL HIGH (ref 70–99)

## 2020-02-22 LAB — HEPATITIS B SURFACE ANTIBODY, QUANTITATIVE: Hep B S AB Quant (Post): <3.1 m[IU]/mL — ABNORMAL LOW (ref >9.9)

## 2020-02-22 MED ORDER — IPRATROPIUM-ALBUTEROL 0.5-2.5 (3) MG/3ML IN SOLN
3.0000 mL | Freq: Four times a day (QID) | RESPIRATORY_TRACT | Status: DC
  Administered 2020-02-22 – 2020-02-24 (×9): 3 mL via RESPIRATORY_TRACT
  Filled 2020-02-22 (×9): qty 3

## 2020-02-22 MED ORDER — INSULIN STARTER KIT- PEN NEEDLES (ENGLISH)
1.0000 | Freq: Once | Status: AC
  Administered 2020-02-22: 1
  Filled 2020-02-22: qty 1

## 2020-02-22 MED ORDER — ENOXAPARIN SODIUM 100 MG/ML ~~LOC~~ SOLN
0.5000 mg/kg | SUBCUTANEOUS | Status: DC
  Administered 2020-02-23 – 2020-02-26 (×4): 87.5 mg via SUBCUTANEOUS
  Filled 2020-02-22 (×4): qty 1

## 2020-02-22 MED ORDER — LIVING WELL WITH DIABETES BOOK
Freq: Once | Status: AC
  Filled 2020-02-22: qty 1

## 2020-02-22 MED ORDER — ALBUTEROL SULFATE (2.5 MG/3ML) 0.083% IN NEBU: 2.5000 mg | INHALATION_SOLUTION | RESPIRATORY_TRACT | Status: DC | PRN

## 2020-02-22 MED ORDER — HYDROCOD POLST-CPM POLST ER 10-8 MG/5ML PO SUER
5.0000 mL | Freq: Two times a day (BID) | ORAL | Status: DC | PRN
  Administered 2020-02-22 – 2020-02-25 (×7): 5 mL via ORAL
  Filled 2020-02-22 (×7): qty 5

## 2020-02-22 NOTE — Progress Notes (Signed)
Progress Note  Patient Name: Jeremiah Vance Date of Encounter: 02/22/2020  Primary Cardiologist: Azucena Cecil (previously Diona Browner)  Subjective   Net - 2.4 L for the admission. Weight 175.5-->173.9 kg over the past 24 hours. Continue upward trend of renal function with BUN/Scr 21/1.56-->30/1.79 with decreased dose of IV Lasix from 60 mg bid to 40 mg bid on the afternoon of 11/22.   Dyspnea and lower extremity swelling unchanged over the past 24 hours. Generalized abdominal discomfort persists. He reports vigorous UOP over the past 24 hours. No chest pain.   Inpatient Medications    Scheduled Meds: . aspirin  81 mg Oral Daily  . atorvastatin  40 mg Oral q1800  . carvedilol  18.75 mg Oral BID WC  . empagliflozin  10 mg Oral q morning - 10a  . enoxaparin (LOVENOX) injection  90 mg Subcutaneous Q24H  . furosemide  40 mg Intravenous Q12H  . insulin aspart  0-5 Units Subcutaneous QHS  . insulin aspart  0-9 Units Subcutaneous TID WC  . insulin aspart  5 Units Subcutaneous TID WC  . insulin glargine  35 Units Subcutaneous QHS  . losartan  50 mg Oral Daily  . sodium chloride flush  3 mL Intravenous Q12H   Continuous Infusions: . sodium chloride     PRN Meds: sodium chloride, acetaminophen **OR** acetaminophen, benzonatate, cyclobenzaprine, labetalol, magnesium hydroxide, ondansetron **OR** ondansetron (ZOFRAN) IV, sodium chloride flush, traZODone   Vital Signs    Vitals:   02/21/20 1545 02/21/20 1946 02/22/20 0325 02/22/20 0735  BP: 132/84 134/70 129/66 (!) 127/59  Pulse: 60 66 62 (!) 58  Resp: 17 17 18 18   Temp: 98.6 F (37 C) 100.3 F (37.9 C) 98.7 F (37.1 C) 98.3 F (36.8 C)  TempSrc: Oral Oral Oral Oral  SpO2: 96% 93% 96% 92%  Weight:   (!) 173.9 kg   Height:        Intake/Output Summary (Last 24 hours) at 02/22/2020 02/24/2020 Last data filed at 02/22/2020 0046 Gross per 24 hour  Intake --  Output 600 ml  Net -600 ml   Filed Weights   02/20/20 0317 02/21/20  0421 02/22/20 0325  Weight: (!) 179.2 kg (!) 175.5 kg (!) 173.9 kg    Telemetry    SR - Personally Reviewed  ECG    No new tracings - Personally Reviewed  Physical Exam   GEN: No acute distress.   Neck: JVD difficult to assess secondary to facial hair and body habitus. Cardiac: RRR, no murmurs, rubs, or gallops.  Respiratory: Bibasilar crackles.  GI: Soft, nontender, distended.   MS: 2+ bilateral lower extremity pitting edema with mild erythema along the anterior aspects of the lower extremities; No deformity. Neuro:  Alert and oriented x 3; Nonfocal.  Psych: Normal affect.  Labs    Chemistry Recent Labs  Lab 02/19/20 2214 02/19/20 2214 02/20/20 0514 02/21/20 0415 02/22/20 0614  NA 135   < > 135 136 136  K 4.4   < > 4.1 4.1 3.9  CL 98   < > 99 96* 95*  CO2 26   < > 25 30 30   GLUCOSE 385*   < > 317* 225* 211*  BUN 17   < > 17 21* 30*  CREATININE 1.41*   < > 1.33* 1.56* 1.79*  CALCIUM 8.5*   < > 8.5* 8.0* 8.0*  PROT 7.3  --   --   --   --   ALBUMIN 3.4*  --   --   --   --  AST 21  --   --   --   --   ALT 22  --   --   --   --   ALKPHOS 89  --   --   --   --   BILITOT 0.8  --   --   --   --   GFRNONAA >60   < > >60 54* 46*  ANIONGAP 11   < > 11 10 11    < > = values in this interval not displayed.     Hematology Recent Labs  Lab 02/19/20 2214 02/20/20 0514  WBC 10.3 9.2  RBC 5.15 5.08  HGB 14.2 14.0  HCT 45.3 44.1  MCV 88.0 86.8  MCH 27.6 27.6  MCHC 31.3 31.7  RDW 13.3 13.3  PLT 260 244    Cardiac EnzymesNo results for input(s): TROPONINI in the last 168 hours. No results for input(s): TROPIPOC in the last 168 hours.   BNP Recent Labs  Lab 02/19/20 2214  BNP 267.9*     DDimer No results for input(s): DDIMER in the last 168 hours.   Radiology    CT ABDOMEN PELVIS WO CONTRAST  Result Date: 02/20/2020 IMPRESSION: 1. No acute abdominopelvic abnormality. 2. Large low midline abdominal wall hernia containing loops of small bowel and a small  amount of free fluid without evidence for an obstruction. 3. Cholelithiasis without acute inflammation. 4. Nodular liver surface with splenomegaly, suggestive of cirrhosis. Enlarged spleen suggestive of portal hypertension. 5. Cardiomegaly. Aortic Atherosclerosis (ICD10-I70.0). Electronically Signed   By: 02/22/2020 M.D.   On: 02/20/2020 23:55    Cardiac Studies   2D echo 07/2019: 1. Left ventricular ejection fraction, by estimation, is 50 to 55%. The  left ventricle has low normal function. The left ventricle demonstrates  global hypokinesis. There is mild left ventricular hypertrophy. Left  ventricular diastolic parameters are  consistent with Grade II diastolic dysfunction (pseudonormalization).  2. Right ventricular systolic function is normal. The right ventricular  size is mildly enlarged. Tricuspid regurgitation signal is inadequate for  assessing PA pressure.  3. Left atrial size was mildly dilated.  Patient Profile     49 y.o. male with history of HFpEF, CVA in 08/2017, CKD stage III, poorly controlled DM2, HTN, and OSA nonadherent to CPAP admitted with hypertensive urgency and acute on chronic HFpEF.  Assessment & Plan    1. Acute on chronic HFpEF: -He continues to be significantly volume overloaded on exam -Continued slight worsening in his renal function with IV diuresis -Continue IV Lasix with close monitoring of renal function -Jardiance, Coreg, losartan   -With low normal LVSF, consider transitioning to [facility] as an outpatient as below -CHF education -Daily weights -Strict I/O   2. Hypertensive urgency: -Reports medication adherence at home -Blood pressure much improved and stable -Continue current regimen including Coreg and losartan -Consider transition to [facility] in the outpatient setting, if he can afford it (will need to complete patient assistance paperwork)  3. Acute on CKD stage III: -Monitor with diuresis   For questions or updates, please  contact [facility] Please consult [email or web address] for contact info under Cardiology/STEMI.    Signed, [facility], PA-C [facility] Pager: [phone] 02/22/2020, 9:07 AM

## 2020-02-22 NOTE — Progress Notes (Signed)
Patient ID: Jeremiah Vance, male   DOB: [birth year], 49 y.o.   MRN: [id number] [facility] PROGRESS NOTE  Jeremiah Vance [id number] DOB: 1970-04-26 DOA: 02/19/2020 PCP: Theadore Nan, NP  HPI/Subjective: Patient coughing a lot more today than yesterday.  Sometimes is able to bring something up but unable to tell me what color it is.  Sometimes it is just a hacking cough.  Tried a cough pill that did not help.  Having some abdominal pain because of the cough.  Being treated for acute CHF exacerbation.  Objective: Vitals:   02/22/20 1211 02/22/20 1313  BP: 120/70   Pulse: (!) 53 (!) 54  Resp: 19 18  Temp: 97.9 F (36.6 C)   SpO2: 96% 94%    Intake/Output Summary (Last 24 hours) at 02/22/2020 1501 Last data filed at 02/22/2020 1345 Gross per 24 hour  Intake 723 ml  Output 600 ml  Net 123 ml   Filed Weights   02/20/20 0317 02/21/20 0421 02/22/20 0325  Weight: (!) 179.2 kg (!) 175.5 kg (!) 173.9 kg    ROS: Review of Systems  Respiratory: Positive for cough and shortness of breath.   Cardiovascular: Negative for chest pain.  Gastrointestinal: Positive for abdominal pain. Negative for nausea and vomiting.   Exam: Physical Exam HENT:     Head: Normocephalic.     Mouth/Throat:     Pharynx: No oropharyngeal exudate.  Eyes:     General: Lids are normal.     Conjunctiva/sclera: Conjunctivae normal.     Pupils: Pupils are equal, round, and reactive to light.  Cardiovascular:     Rate and Rhythm: Normal rate and regular rhythm.     Heart sounds: Normal heart sounds, S1 normal and S2 normal.  Pulmonary:     Breath sounds: Examination of the right-middle field reveals wheezing. Examination of the left-middle field reveals wheezing. Examination of the right-lower field reveals decreased breath sounds and rhonchi. Examination of the left-lower field reveals decreased breath sounds and rhonchi. Decreased breath sounds, wheezing and rhonchi present. No rales.  Abdominal:      Palpations: Abdomen is soft.     Tenderness: There is abdominal tenderness.     Hernia: A hernia is present. Hernia is present in the ventral area.  Musculoskeletal:     Right lower leg: Swelling present.     Left lower leg: Swelling present.  Skin:    General: Skin is warm.     Comments: Bilateral lower extremity chronic pinkish discoloration.  Small ulceration with scab left lower leg.  Neurological:     Mental Status: He is alert and oriented to person, place, and time.       Data Reviewed: Basic Metabolic Panel: Recent Labs  Lab 02/19/20 2214 02/20/20 0028 02/20/20 0514 02/21/20 0415 02/22/20 0614  NA 135  --  135 136 136  K 4.4  --  4.1 4.1 3.9  CL 98  --  99 96* 95*  CO2 26  --  25 30 30   GLUCOSE 385*  --  317* 225* 211*  BUN 17  --  17 21* 30*  CREATININE 1.41*  --  1.33* 1.56* 1.79*  CALCIUM 8.5*  --  8.5* 8.0* 8.0*  MG  --  2.0  --   --   --    Liver Function Tests: Recent Labs  Lab 02/19/20 2214  AST 21  ALT 22  ALKPHOS 89  BILITOT 0.8  PROT 7.3  ALBUMIN 3.4*   CBC:  Recent Labs  Lab 02/19/20 2214 02/20/20 0514  WBC 10.3 9.2  HGB 14.2 14.0  HCT 45.3 44.1  MCV 88.0 86.8  PLT 260 244   BNP (last 3 results) Recent Labs    07/08/19 0938 02/19/20 2214  BNP 219.0* 267.9*    CBG: Recent Labs  Lab 02/21/20 1148 02/21/20 1655 02/21/20 2155 02/22/20 0736 02/22/20 1211  GLUCAP 195* 132* 200* 185* 175*    Recent Results (from the past 240 hour(s))  Resp Panel by RT-PCR (Flu A&B, Covid) Nasopharyngeal Swab     Status: None   Collection Time: 02/19/20 11:28 PM   Specimen: Nasopharyngeal Swab; Nasopharyngeal(NP) swabs in vial transport medium  Result Value Ref Range Status   SARS Coronavirus 2 by RT PCR NEGATIVE NEGATIVE Final    Comment: (NOTE) SARS-CoV-2 target nucleic acids are NOT DETECTED.  The SARS-CoV-2 RNA is generally detectable in upper respiratory specimens during the acute phase of infection. The lowest concentration of  SARS-CoV-2 viral copies this assay can detect is 138 copies/mL. A negative result does not preclude SARS-Cov-2 infection and should not be used as the sole basis for treatment or other patient management decisions. A negative result may occur with  improper specimen collection/handling, submission of specimen other than nasopharyngeal swab, presence of viral mutation(s) within the areas targeted by this assay, and inadequate number of viral copies(<138 copies/mL). A negative result must be combined with clinical observations, patient history, and epidemiological information. The expected result is Negative.  Fact Sheet for Patients:  BloggerCourse.com  Fact Sheet for Healthcare Providers:  SeriousBroker.it  This test is no t yet approved or cleared by the [place] FDA and  has been authorized for detection and/or diagnosis of SARS-CoV-2 by FDA under an Emergency Use Authorization (EUA). This EUA will remain  in effect (meaning this test can be used) for the duration of the COVID-19 declaration under Section 564(b)(1) of the Act, 21 U.S.C.section 360bbb-3(b)(1), unless the authorization is terminated  or revoked sooner.       Influenza A by PCR NEGATIVE NEGATIVE Final   Influenza B by PCR NEGATIVE NEGATIVE Final    Comment: (NOTE) The Xpert Xpress SARS-CoV-2/FLU/RSV plus assay is intended as an aid in the diagnosis of influenza from Nasopharyngeal swab specimens and should not be used as a sole basis for treatment. Nasal washings and aspirates are unacceptable for Xpert Xpress SARS-CoV-2/FLU/RSV testing.  Fact Sheet for Patients: BloggerCourse.com  Fact Sheet for Healthcare Providers: SeriousBroker.it  This test is not yet approved or cleared by the [place] FDA and has been authorized for detection and/or diagnosis of SARS-CoV-2 by FDA under an Emergency Use  Authorization (EUA). This EUA will remain in effect (meaning this test can be used) for the duration of the COVID-19 declaration under Section 564(b)(1) of the Act, 21 U.S.C. section 360bbb-3(b)(1), unless the authorization is terminated or revoked.  Performed at [facility], [address] [place], [place] [address]      Studies: CT ABDOMEN PELVIS WO CONTRAST  Result Date: 02/20/2020 CLINICAL DATA:  New onset right lower quadrant pain EXAM: CT ABDOMEN AND PELVIS WITHOUT CONTRAST TECHNIQUE: Multidetector CT imaging of the abdomen and pelvis was performed following the standard protocol without IV contrast. COMPARISON:  October 21, 2017 FINDINGS: Lower chest: There is a stable 5 mm pulmonary nodule in the right lower lobe. There is a mild amount of atelectasis at the lung bases.The heart is enlarged. Hepatobiliary: The liver surface appears nodular, although it is poorly  evaluated secondary to respiratory motion artifact. Cholelithiasis without acute inflammation.There is no biliary ductal dilation. Pancreas: Normal contours without ductal dilatation. No peripancreatic fluid collection. Spleen: The spleen is enlarged measuring 14 cm craniocaudad. Adrenals/Urinary Tract: --Adrenal glands: There is a small fat containing left adrenal myelolipoma. --Right kidney/ureter: No hydronephrosis or radiopaque kidney stones. --Left kidney/ureter: No hydronephrosis or radiopaque kidney stones. --Urinary bladder: Unremarkable. Stomach/Bowel: --Stomach/Duodenum: No hiatal hernia or other gastric abnormality. Normal duodenal course and caliber. --Small bowel: Unremarkable. --Colon: Unremarkable. --Appendix: Normal. Vascular/Lymphatic: Normal course and caliber of the major abdominal vessels. --No retroperitoneal lymphadenopathy. --No mesenteric lymphadenopathy. --No pelvic or inguinal lymphadenopathy. Reproductive: Unremarkable Other: No ascites or free air. There is a large low midline abdominal wall hernia.  This hernia contains loops of small bowel and a small amount of free fluid without evidence for an obstruction. Musculoskeletal. No acute displaced fractures. IMPRESSION: 1. No acute abdominopelvic abnormality. 2. Large low midline abdominal wall hernia containing loops of small bowel and a small amount of free fluid without evidence for an obstruction. 3. Cholelithiasis without acute inflammation. 4. Nodular liver surface with splenomegaly, suggestive of cirrhosis. Enlarged spleen suggestive of portal hypertension. 5. Cardiomegaly. Aortic Atherosclerosis (ICD10-I70.0). Electronically Signed   By: Katherine Mantle M.D.   On: 02/20/2020 23:55   DG Chest 2 View  Result Date: 02/22/2020 CLINICAL DATA:  49 year old male with a history of shortness of breath EXAM: CHEST - 2 VIEW COMPARISON:  02/19/2020 FINDINGS: Cardiomediastinal silhouette unchanged with cardiomegaly. No pneumothorax or pleural effusion. Improved aeration compared to the prior. No interlobular septal thickening. No new confluent airspace disease IMPRESSION: Low lung volumes with likely atelectasis and no evidence of acute cardiopulmonary disease Electronically Signed   By: Gilmer Mor D.O.   On: 02/22/2020 11:56    Scheduled Meds: . aspirin  81 mg Oral Daily  . atorvastatin  40 mg Oral q1800  . carvedilol  18.75 mg Oral BID WC  . empagliflozin  10 mg Oral q morning - 10a  . [START ON 02/23/2020] enoxaparin (LOVENOX) injection  0.5 mg/kg Subcutaneous Q24H  . furosemide  40 mg Intravenous Q12H  . insulin aspart  0-5 Units Subcutaneous QHS  . insulin aspart  0-9 Units Subcutaneous TID WC  . insulin aspart  5 Units Subcutaneous TID WC  . insulin glargine  35 Units Subcutaneous QHS  . ipratropium-albuterol  3 mL Nebulization Q6H  . losartan  50 mg Oral Daily  . sodium chloride flush  3 mL Intravenous Q12H   Continuous Infusions: . sodium chloride      Assessment/Plan:  1. Acute diastolic congestive heart failure.  Cardiology  wants to continue Lasix 40 mg IV twice daily.  Patient on Coreg and losartan.  Cardiology also started [place].  Watch creatinine closely since it is starting to creep up.  Worsening cough today.  Chest x-ray today reviewed by me does not show any pneumonia. 2. Acute kidney injury stage IIIa on chronic kidney disease stage II.  Monitor closely with diuresis.  Creatinine up to 1.79 today. 3. Cough.  I did prescribe Tussionex and nebulizer treatments.  Chest x-ray did not show any pneumonia. 4. Acute hypoxic respiratory failure.  Pulse ox 87% on room air on presentation.  Continue to try to taper off oxygen. 5. Type 2 diabetes mellitus uncontrolled with hyperlipidemia.  Continue atorvastatin.  Continue Lantus insulin 35 units daily.  Hemoglobin A1c very elevated at 12.0.  Continue short acting insulin prior to meals. 6. Morbid obesity with a BMI  of 55.0.  Continue to monitor daily weights with diuresis. 7. Neck pain.  Trial of Flexeril 8. Possible cirrhosis of liver seen on CT scan of the abdomen.  Hepatitis B profile and hepatitis C negative. 9. Large ventral hernia with loops of small bowel seen on CT scan but no obstruction or incarceration seen.  Patient did have prior surgery in [facility] and recommend getting back to his surgeon as outpatient once respiratory status better.  Coughing has made his hernia worse.  Try to suppress cough.      Code Status:     Code Status Orders  (From admission, onward)         Start     Ordered   02/20/20 0128  Full code  Continuous        02/20/20 0130        Code Status History    Date Active Date Inactive Code Status Order ID Comments User Context   07/08/2019 1419 07/09/2019 1637 Full Code [id number]  Lorretta Harp, MD ED   10/21/2017 2254 10/28/2017 1857 Full Code [id number]  Axel Filler, MD ED   09/14/2017 1440 09/17/2017 1941 Full Code [id number]  Lovena Neighbours, MD ED   09/06/2017 1929 09/08/2017 2113 Full Code [id number]  Myrene Buddy, MD ED    07/10/2017 1947 07/18/2017 2142 Full Code [id number]  Bobette Mo, MD ED   Advance Care Planning Activity     Family Communication: Tried to call sister and left message. Disposition Plan: Status is: Inpatient  Dispo: The patient is from: Home              Anticipated d/c is to: Home              Anticipated d/c date is: Another day or so with diuresis              Patient currently receiving IV diuresis.  Today patient coughing a lot more and added nebulizer treatments.  Consultants:  Cardiology  Time spent: 28 minutes  Deanda Ruddell [employer]

## 2020-02-22 NOTE — Progress Notes (Signed)
Inpatient Diabetes Program Recommendations  AACE/ADA: New Consensus Statement on Inpatient Glycemic Control (2015)  Target Ranges:  Prepandial:   less than 140 mg/dL      Peak postprandial:   less than 180 mg/dL (1-2 hours)      Critically ill patients:  140 - 180 mg/dL   Lab Results  Component Value Date   GLUCAP 185 (H) 02/22/2020   HGBA1C 12.0 (H) 02/20/2020    Review of Glycemic Control  Diabetes history: DM2 Outpatient Diabetes medications: Ozempic .25 mg q week + Jardiance 10 mg qd Current orders for Inpatient glycemic control: Lantus 35 units qd + Jardiance 10 mg qd + Novolog 6 units tid meal coverage + Novolog sensitive tid + hs 0-5 units  Inpatient Diabetes Program Recommendations:   Nurses, please review with patient how to administer insulin and allow patient to give own injections when appropriate. Secure chat sent to Britt Bolognese RN. Ordered insulin pen starter kit and Living Well with Diabetes. Attempted to speak with pt. But was leaving for xray. Will reattempt to speak with pt.  Thank you, Nani Gasser. Taylia Berber, RN, MSN, CDE  Diabetes Coordinator Inpatient Glycemic Control Team Team Pager [phone] (8am-5pm) 02/22/2020 11:40 AM

## 2020-02-22 NOTE — TOC Progression Note (Signed)
Transition of Care [facility]) - Progression Note    Patient Details  Name: Jeremiah Vance MRN: [id number] Date of Birth: 1970/04/04  Transition of Care [facility]) CM/SW Contact  Eilleen Kempf, LCSW Phone Number: 02/22/2020, 12:37 PM  Clinical Narrative:    CSW called patient's sister, [name] he lives with to explain the med management process and ask her to assist him with the application. No answer, left message          Expected Discharge Plan and Services                                                 Social Determinants of Health (SDOH) Interventions    Readmission Risk Interventions No flowsheet data found.

## 2020-02-22 NOTE — Progress Notes (Signed)
° °  Heart Failure Nurse Navigator Note  HFpEF 50-55%. Global hypokinesis.  Mild LVH. Grade II diastolic dysfunction. Right ventricular systolic function is normal. Right ventricle is mildly enlarged.  Mild LAE.   He presented with complaints of abdominal pain, found to be in heart failure.  BP 191/88.  Unable to perform Reds clip vest due to body habitus greater than 38.  Co morbidities:  Diabetes type II Morbid obesity Uncontrolled hypertension Hyperlipidemia OSA non compliant with CPAP CKD stage III  Medications:  ASA 81 mg Lipitor 40 mg daily Coreg 18.75 mg BID Jardiance 10 mg daily Lasix 40 mg IV BID Losartan 50 mg daily  Lasix has been decreased today due to worsening kidney function.  Cardiology  is also considering adding Entresto as outpatient  to his regime.   Which would place him on 3 of the 4 pillars of GDMT.   Labs:  Sodium 136, potassium 3.9, Bun 30 (21), creatinine 1.79 (1.56) Weight 173.9(175) kg BMI 55 BP 127/59  Intake 1200 ml Output 1950 ml   Assessment:  General- lying in bed no acute distress, states he still has abdominal discomfort which he rates a 7 out 10.  States it is just to the right of the incision.  This has been going on for 2 years- since his surgery.   HEENT- pupils equal, Normocephalic  Cardiac-  Heart tones are regular  Chest - diminished breath sounds bases  Abdomen- obese, rounded  Musculoskeletal- +edema, skin discoloration significant for venous stasis.  Psych is pleasant and appropriate.  Neuro- speech is clear, moves all extremities without difficulty.   Discussed importance of monitoring blood pressures and how that relates to heart failure.  Discussed keeping daily diary and contacting doctors when pressures are elevated.  Also discussed weighing daily, and recording.  When to contact physician.[name] Kitchen  He states that he is compliant with his medications, but does not weigh or check his blood pressure.  He has both  a scale and blood pressure cuff.  The sister he lives with is a Engineer, site.  He states due to the long hours they both work, they cook very little at home. Discussed making good choices when eating at restaurants.  Also discussed limiting fluid intake- no more that 64 ounces daily,  Offered to let him view heart failure video, he states he has seen those in the past and not interested at this time.  Was given heart failure teaching booklet and zone magnet.   Tresa Endo RN,CHFN

## 2020-02-23 ENCOUNTER — Other Ambulatory Visit: Payer: Self-pay | Admitting: Family Medicine

## 2020-02-23 DIAGNOSIS — I5031 Acute diastolic (congestive) heart failure: Secondary | ICD-10-CM

## 2020-02-23 DIAGNOSIS — R0602 Shortness of breath: Secondary | ICD-10-CM

## 2020-02-23 LAB — GLUCOSE, CAPILLARY
Glucose-Capillary: 124 mg/dL — ABNORMAL HIGH (ref 70–99)
Glucose-Capillary: 200 mg/dL — ABNORMAL HIGH (ref 70–99)
Glucose-Capillary: 173 mg/dL — ABNORMAL HIGH (ref 70–99)
Glucose-Capillary: 285 mg/dL — ABNORMAL HIGH (ref 70–99)

## 2020-02-23 LAB — CBC
HCT: 45 % (ref 39.0–52.0)
Hemoglobin: 14.1 g/dL (ref 13.0–17.0)
MCH: 27.9 pg (ref 26.0–34.0)
MCHC: 31.3 g/dL (ref 30.0–36.0)
MCV: 89.1 fL (ref 80.0–100.0)
Platelets: 258 10*3/uL (ref 150–400)
RBC: 5.05 MIL/uL (ref 4.22–5.81)
RDW: 13.5 % (ref 11.5–15.5)
WBC: 11 10*3/uL — ABNORMAL HIGH (ref 4.0–10.5)
nRBC: 0 % (ref 0.0–0.2)

## 2020-02-23 LAB — BASIC METABOLIC PANEL
Anion gap: 11 (ref 5–15)
BUN: 35 mg/dL — ABNORMAL HIGH (ref 6–20)
CO2: 33 mmol/L — ABNORMAL HIGH (ref 22–32)
Calcium: 8.2 mg/dL — ABNORMAL LOW (ref 8.9–10.3)
Chloride: 95 mmol/L — ABNORMAL LOW (ref 98–111)
Creatinine, Ser: 1.89 mg/dL — ABNORMAL HIGH (ref 0.61–1.24)
GFR, Estimated: 43 mL/min — ABNORMAL LOW (ref >60)
Glucose, Bld: 145 mg/dL — ABNORMAL HIGH (ref 70–99)
Potassium: 3.8 mmol/L (ref 3.5–5.1)
Sodium: 139 mmol/L (ref 135–145)

## 2020-02-23 MED ORDER — CYCLOBENZAPRINE HCL 5 MG PO TABS
5.0000 mg | ORAL_TABLET | Freq: Three times a day (TID) | ORAL | 0 refills | Status: DC | PRN
Start: 2020-02-23 — End: 2020-03-09

## 2020-02-23 MED ORDER — TORSEMIDE 20 MG PO TABS: 40.0000 mg | ORAL_TABLET | Freq: Every day | ORAL | 3 refills | Status: DC

## 2020-02-23 MED ORDER — TRULICITY 1.5 MG/0.5ML ~~LOC~~ SOAJ: 1.5000 mg | SUBCUTANEOUS | 4 refills | Status: DC

## 2020-02-23 MED ORDER — OZEMPIC (0.25 OR 0.5 MG/DOSE) 2 MG/1.5ML ~~LOC~~ SOPN
PEN_INJECTOR | SUBCUTANEOUS | 3 refills | Status: DC
Start: 2020-02-23 — End: 2020-02-23

## 2020-02-23 MED ORDER — FUROSEMIDE 10 MG/ML IJ SOLN
40.0000 mg | Freq: Every day | INTRAMUSCULAR | Status: DC
  Administered 2020-02-24 – 2020-02-25 (×2): 40 mg via INTRAVENOUS
  Filled 2020-02-23 (×2): qty 4

## 2020-02-23 MED ORDER — EMPAGLIFLOZIN 10 MG PO TABS
10.0000 mg | ORAL_TABLET | Freq: Every morning | ORAL | 1 refills | Status: DC
Start: 2020-02-23 — End: 2020-02-23

## 2020-02-23 MED ORDER — CARVEDILOL 6.25 MG PO TABS
18.7500 mg | ORAL_TABLET | Freq: Two times a day (BID) | ORAL | 2 refills | Status: DC
Start: 2020-02-23 — End: 2020-03-09

## 2020-02-23 NOTE — Progress Notes (Signed)
Patient ID: Jeremiah Vance, male   DOB: 08-17-1970, 49 y.o.   MRN: [id number] [facility] PROGRESS NOTE  Jeremiah Vance:[id number] DOB: 06-05-1970 DOA: 02/19/2020 PCP: Theadore Nan, NP  HPI/Subjective: Doing fair some cough no chills no rigors Getting neb Feels a little better overall  Objective: Vitals:   02/23/20 1203 02/23/20 1430  BP: 115/67   Pulse: (!) 50   Resp:    Temp: (!) 97.5 F (36.4 C)   SpO2: 94% 94%    Intake/Output Summary (Last 24 hours) at 02/23/2020 1446 Last data filed at 02/23/2020 1345 Gross per 24 hour  Intake 1200 ml  Output 1175 ml  Net 25 ml   Filed Weights   02/21/20 0421 02/22/20 0325 02/23/20 0529  Weight: (!) 175.5 kg (!) 173.9 kg (!) 173.1 kg    ROS: Review of Systems  Respiratory: Positive for cough and shortness of breath.   Cardiovascular: Negative for chest pain.  Gastrointestinal: Positive for abdominal pain. Negative for nausea and vomiting.   Exam: Awake coherent quite sleepy arousable however poor eye contact EOMI NCAT Mild JVD No rales no rhonchi Abdomen slightly tender pressure Cannot appreciate liver Grade 2 lower extremity edema Neurologically intact moving 4 limbs equally without focal deficit   Data Reviewed: Basic Metabolic Panel: Recent Labs  Lab 02/19/20 2214 02/20/20 0028 02/20/20 0514 02/21/20 0415 02/22/20 0614 02/23/20 0429  NA 135  --  135 136 136 139  K 4.4  --  4.1 4.1 3.9 3.8  CL 98  --  99 96* 95* 95*  CO2 26  --  25 30 30  33*  GLUCOSE 385*  --  317* 225* 211* 145*  BUN 17  --  17 21* 30* 35*  CREATININE 1.41*  --  1.33* 1.56* 1.79* 1.89*  CALCIUM 8.5*  --  8.5* 8.0* 8.0* 8.2*  MG  --  2.0  --   --   --   --    Liver Function Tests: Recent Labs  Lab 02/19/20 2214  AST 21  ALT 22  ALKPHOS 89  BILITOT 0.8  PROT 7.3  ALBUMIN 3.4*   CBC: Recent Labs  Lab 02/19/20 2214 02/20/20 0514 02/23/20 0429  WBC 10.3 9.2 11.0*  HGB 14.2 14.0 14.1  HCT 45.3 44.1 45.0  MCV  88.0 86.8 89.1  PLT 260 244 258   BNP (last 3 results) Recent Labs    07/08/19 0938 02/19/20 2214  BNP 219.0* 267.9*    CBG: Recent Labs  Lab 02/22/20 1211 02/22/20 1726 02/22/20 2030 02/23/20 0805 02/23/20 1204  GLUCAP 175* 234* 224* 124* 200*    Recent Results (from the past 240 hour(s))  Resp Panel by RT-PCR (Flu A&B, Covid) Nasopharyngeal Swab     Status: None   Collection Time: 02/19/20 11:28 PM   Specimen: Nasopharyngeal Swab; Nasopharyngeal(NP) swabs in vial transport medium  Result Value Ref Range Status   SARS Coronavirus 2 by RT PCR NEGATIVE NEGATIVE Final    Comment: (NOTE) SARS-CoV-2 target nucleic acids are NOT DETECTED.  The SARS-CoV-2 RNA is generally detectable in upper respiratory specimens during the acute phase of infection. The lowest concentration of SARS-CoV-2 viral copies this assay can detect is 138 copies/mL. A negative result does not preclude SARS-Cov-2 infection and should not be used as the sole basis for treatment or other patient management decisions. A negative result may occur with  improper specimen collection/handling, submission of specimen other than nasopharyngeal swab, presence of viral mutation(s) within the  areas targeted by this assay, and inadequate number of viral copies(<138 copies/mL). A negative result must be combined with clinical observations, patient history, and epidemiological information. The expected result is Negative.  Fact Sheet for Patients:  BloggerCourse.com  Fact Sheet for Healthcare Providers:  SeriousBroker.it  This test is no t yet approved or cleared by the [place] FDA and  has been authorized for detection and/or diagnosis of SARS-CoV-2 by FDA under an Emergency Use Authorization (EUA). This EUA will remain  in effect (meaning this test can be used) for the duration of the COVID-19 declaration under Section 564(b)(1) of the Act,  21 U.S.C.section 360bbb-3(b)(1), unless the authorization is terminated  or revoked sooner.       Influenza A by PCR NEGATIVE NEGATIVE Final   Influenza B by PCR NEGATIVE NEGATIVE Final    Comment: (NOTE) The Xpert Xpress SARS-CoV-2/FLU/RSV plus assay is intended as an aid in the diagnosis of influenza from Nasopharyngeal swab specimens and should not be used as a sole basis for treatment. Nasal washings and aspirates are unacceptable for Xpert Xpress SARS-CoV-2/FLU/RSV testing.  Fact Sheet for Patients: BloggerCourse.com  Fact Sheet for Healthcare Providers: SeriousBroker.it  This test is not yet approved or cleared by the [place] FDA and has been authorized for detection and/or diagnosis of SARS-CoV-2 by FDA under an Emergency Use Authorization (EUA). This EUA will remain in effect (meaning this test can be used) for the duration of the COVID-19 declaration under Section 564(b)(1) of the Act, 21 U.S.C. section 360bbb-3(b)(1), unless the authorization is terminated or revoked.  Performed at [facility], [address] [place], [place] [address]      Studies: DG Chest 2 View  Result Date: 02/22/2020 CLINICAL DATA:  49 year old male with a history of shortness of breath EXAM: CHEST - 2 VIEW COMPARISON:  02/19/2020 FINDINGS: Cardiomediastinal silhouette unchanged with cardiomegaly. No pneumothorax or pleural effusion. Improved aeration compared to the prior. No interlobular septal thickening. No new confluent airspace disease IMPRESSION: Low lung volumes with likely atelectasis and no evidence of acute cardiopulmonary disease Electronically Signed   By: Gilmer Mor D.O.   On: 02/22/2020 11:56    Scheduled Meds: . aspirin  81 mg Oral Daily  . atorvastatin  40 mg Oral q1800  . carvedilol  18.75 mg Oral BID WC  . empagliflozin  10 mg Oral q morning - 10a  . enoxaparin (LOVENOX) injection  0.5 mg/kg  Subcutaneous Q24H  . [START ON 02/24/2020] furosemide  40 mg Intravenous Daily  . insulin aspart  0-5 Units Subcutaneous QHS  . insulin aspart  0-9 Units Subcutaneous TID WC  . insulin aspart  5 Units Subcutaneous TID WC  . insulin glargine  35 Units Subcutaneous QHS  . ipratropium-albuterol  3 mL Nebulization Q6H  . losartan  50 mg Oral Daily  . sodium chloride flush  3 mL Intravenous Q12H   Continuous Infusions: . sodium chloride      Assessment/Plan:  1. Acute diastolic congestive heart failure.  Cardiology switched twice daily to once daily Lasix IV-continue Coreg and losartan.  Cardiology also started [place].  Watch creatinine   Worsening cough today.  Chest x-ray today reviewed by me does not show any pneumonia. 2. Acute kidney injury stage IIIa on chronic kidney disease stage II.  Monitor closely with diuresis.  BUN/creatinine 35/1.89 3. Cough.  I did prescribe Tussionex and nebulizer treatments.  Chest x-ray did not show any pneumonia. 4. Acute hypoxic respiratory failure.  Pulse ox  87% on room air on presentation.  Continue to attempt to wean 5. Type 2 diabetes mellitus uncontrolled with hyperlipidemia.  Continue atorvastatin.  Continue Lantus insulin 35 units daily.  Hemoglobin A1c very elevated at 12.0.  Continue short acting insulin prior to meals. 6. Morbid obesity with a BMI of 55.0.  Continue to monitor daily weights with diuresis. 7. Neck pain.  Trial of Flexeril 8. Possible cirrhosis of liver seen on CT scan of the abdomen.  Hepatitis B profile and hepatitis C negative. 9. Large ventral hernia with loops of small bowel seen on CT scan but no obstruction or incarceration seen.  Patient did have prior surgery in [facility] and recommend getting back to his surgeon as outpatient once respiratory status better.  Coughing has made his hernia worse.  Try to suppress cough.      Code Status:     Code Status Orders  (From admission, onward)         Start     Ordered    02/20/20 0128  Full code  Continuous        02/20/20 0130        Code Status History    Date Active Date Inactive Code Status Order ID Comments User Context   07/08/2019 1419 07/09/2019 1637 Full Code [id number]  Lorretta Harp, MD ED   10/21/2017 2254 10/28/2017 1857 Full Code [id number]  Axel Filler, MD ED   09/14/2017 1440 09/17/2017 1941 Full Code [id number]  Lovena Neighbours, MD ED   09/06/2017 1929 09/08/2017 2113 Full Code [id number]  Myrene Buddy, MD ED   07/10/2017 1947 07/18/2017 2142 Full Code [id number]  Bobette Mo, MD ED   Advance Care Planning Activity     Family Communication: Tried to call sister and left message. Disposition Plan: Status is: Inpatient  Dispo: The patient is from: Home              Anticipated d/c is to: Home              Anticipated d/c date is: Another day or so with diuresis              Patient currently receiving IV diuresis.  Today patient coughing a lot more and added nebulizer treatments.  Consultants:  Cardiology  Time spent: 28 minutes  [facility]  [facility]

## 2020-02-23 NOTE — Progress Notes (Signed)
Progress Note  Patient Name: Jeremiah Vance Date of Encounter: 02/23/2020  Primary Cardiologist: Azucena Cecil (previously Diona Browner)  Subjective   Positive I/O over the past 24 hours. Net - 2.2 L for the admission. Weight 173.9-->173.1 kg over the past 24 hours. No BMP this morning.   Dyspnea slightly improved this morning following a breathing treatment. Lower extremity swelling is unchanged. No chest pain.   Inpatient Medications    Scheduled Meds: . aspirin  81 mg Oral Daily  . atorvastatin  40 mg Oral q1800  . carvedilol  18.75 mg Oral BID WC  . empagliflozin  10 mg Oral q morning - 10a  . enoxaparin (LOVENOX) injection  0.5 mg/kg Subcutaneous Q24H  . furosemide  40 mg Intravenous Q12H  . insulin aspart  0-5 Units Subcutaneous QHS  . insulin aspart  0-9 Units Subcutaneous TID WC  . insulin aspart  5 Units Subcutaneous TID WC  . insulin glargine  35 Units Subcutaneous QHS  . ipratropium-albuterol  3 mL Nebulization Q6H  . losartan  50 mg Oral Daily  . sodium chloride flush  3 mL Intravenous Q12H   Continuous Infusions: . sodium chloride     PRN Meds: sodium chloride, acetaminophen **OR** acetaminophen, albuterol, benzonatate, chlorpheniramine-HYDROcodone, cyclobenzaprine, labetalol, magnesium hydroxide, ondansetron **OR** ondansetron (ZOFRAN) IV, sodium chloride flush, traZODone   Vital Signs    Vitals:   02/22/20 2041 02/23/20 0118 02/23/20 0529 02/23/20 0755  BP:   137/71   Pulse:   (!) 57   Resp:      Temp:   98.4 F (36.9 C)   TempSrc:   Oral   SpO2: 93% 94% 94% 90%  Weight:   (!) 173.1 kg   Height:        Intake/Output Summary (Last 24 hours) at 02/23/2020 0816 Last data filed at 02/23/2020 0528 Gross per 24 hour  Intake 1203 ml  Output 1050 ml  Net 153 ml   Filed Weights   02/21/20 0421 02/22/20 0325 02/23/20 0529  Weight: (!) 175.5 kg (!) 173.9 kg (!) 173.1 kg    Telemetry    SR - Personally Reviewed  ECG    No new tracings -  Personally Reviewed  Physical Exam   GEN: No acute distress.   Neck: JVD difficult to assess secondary to facial hair and body habitus. Cardiac: RRR, no murmurs, rubs, or gallops.  Respiratory: Bibasilar crackles.  GI: Soft, nontender, distended.   MS: 2+ bilateral lower extremity pitting edema with mild erythema along the anterior aspects of the lower extremities; No deformity. Neuro:  Alert and oriented x 3; Nonfocal.  Psych: Normal affect.  Labs    Chemistry Recent Labs  Lab 02/19/20 2214 02/19/20 2214 02/20/20 0514 02/21/20 0415 02/22/20 0614  NA 135   < > 135 136 136  K 4.4   < > 4.1 4.1 3.9  CL 98   < > 99 96* 95*  CO2 26   < > 25 30 30   GLUCOSE 385*   < > 317* 225* 211*  BUN 17   < > 17 21* 30*  CREATININE 1.41*   < > 1.33* 1.56* 1.79*  CALCIUM 8.5*   < > 8.5* 8.0* 8.0*  PROT 7.3  --   --   --   --   ALBUMIN 3.4*  --   --   --   --   AST 21  --   --   --   --   ALT  22  --   --   --   --   ALKPHOS 89  --   --   --   --   BILITOT 0.8  --   --   --   --   GFRNONAA >60   < > >60 54* 46*  ANIONGAP 11   < > 11 10 11    < > = values in this interval not displayed.     Hematology Recent Labs  Lab 02/19/20 2214 02/20/20 0514 02/23/20 0429  WBC 10.3 9.2 11.0*  RBC 5.15 5.08 5.05  HGB 14.2 14.0 14.1  HCT 45.3 44.1 45.0  MCV 88.0 86.8 89.1  MCH 27.6 27.6 27.9  MCHC 31.3 31.7 31.3  RDW 13.3 13.3 13.5  PLT 260 244 258    Cardiac EnzymesNo results for input(s): TROPONINI in the last 168 hours. No results for input(s): TROPIPOC in the last 168 hours.   BNP Recent Labs  Lab 02/19/20 2214  BNP 267.9*     DDimer No results for input(s): DDIMER in the last 168 hours.   Radiology    CT ABDOMEN PELVIS WO CONTRAST  Result Date: 02/20/2020 IMPRESSION: 1. No acute abdominopelvic abnormality. 2. Large low midline abdominal wall hernia containing loops of small bowel and a small amount of free fluid without evidence for an obstruction. 3. Cholelithiasis without  acute inflammation. 4. Nodular liver surface with splenomegaly, suggestive of cirrhosis. Enlarged spleen suggestive of portal hypertension. 5. Cardiomegaly. Aortic Atherosclerosis (ICD10-I70.0). Electronically Signed   By: 02/22/2020 M.D.   On: 02/20/2020 23:55    Cardiac Studies   2D echo 07/2019: 1. Left ventricular ejection fraction, by estimation, is 50 to 55%. The  left ventricle has low normal function. The left ventricle demonstrates  global hypokinesis. There is mild left ventricular hypertrophy. Left  ventricular diastolic parameters are  consistent with Grade II diastolic dysfunction (pseudonormalization).  2. Right ventricular systolic function is normal. The right ventricular  size is mildly enlarged. Tricuspid regurgitation signal is inadequate for  assessing PA pressure.  3. Left atrial size was mildly dilated.  Patient Profile     49 y.o. male with history of HFpEF, CVA in 08/2017, CKD stage III, poorly controlled DM2, HTN, and OSA nonadherent to CPAP admitted with hypertensive urgency and acute on chronic HFpEF.  Assessment & Plan    1. Dyspnea: -Likely multifactorial including acute on chronic HFpEF and possible COPD exacerbation/bronchitis  -He continues to be have significant lower extremity swelling on exam -No BMP this morning, await results -If renal function continues to worsen, we will need to hold his PM Lasix and he may need a RHC on 11/26 -Jardiance, Coreg, losartan   -With low normal LVSF, consider transitioning to [facility] as an outpatient as below -CHF education -Daily weights -Strict I/O  -Cannot exclude a component of underlying lung disease contributing to his dyspnea as well, this is deferred to IM  2. Hypertensive urgency: -Reports medication adherence at home -Blood pressure much improved and stable -Continue current regimen including Coreg and losartan -Consider transition to [facility] in the outpatient setting, if he can afford it  (will need to complete patient assistance paperwork)  3. Acute on CKD stage III: -BMP pending -Monitor with diuresis    For questions or updates, please contact [facility] Please consult [email or web address] for contact info under Cardiology/STEMI.    Signed, [facility], PA-C [facility] Pager: [phone] 02/23/2020, 8:16 AM

## 2020-02-23 NOTE — Progress Notes (Signed)
Mobility Specialist - Progress Note   02/23/20 1215  Mobility  Activity Ambulated in hall  Level of Assistance Standby assist, set-up cues, supervision of patient - no hands on  Assistive Device Front wheel walker  Distance Ambulated (ft) 60 ft  Mobility Response Tolerated well  Mobility performed by Mobility specialist  $Mobility charge 1 Mobility     Pre-mobility: 62 HR, 91% SpO2 During mobility: 68 HR, 95% SpO2 Post-mobility: 60 HR, 94% SpO2   Pt was lying in bed upon arrival utilizing 2L  O2. Pt agreed to session. Pt c/o pain "everywhere" and rated it a 8/10. Pt was able to get EOB SBA with rolling technique. Pt needed rest break while EOB before proceeding with activity. Pt stood with no physical assistance. Pt ambulated 5' without AD before one was provided d/t safety concerns. Pt stated, he doesn't use an AD at baseline. Pt progressed to ambulation in hallway. Pt ambulated 30' in room/hallway with several standing breaks taken to manage pain and fatigue. Further mobility deferred d/t pt c/o fatigue and weakness in LE. O2 > 93% throughout activity (2L). Upon return to room, pt sat EOB to preserve energy before ambulating to bathroom. Pt is independent in hygiene. Pt returned EOB-supine SBA. Overall, pt tolerated session well. Pt rated his RPE this session a 8/10. Pt was left in bed with all needs in reach. Nurse notified of performance.    Filiberto Pinks Mobility Specialist 02/23/20, 12:28 PM

## 2020-02-23 NOTE — Progress Notes (Signed)
Inpatient Diabetes Program Recommendations  AACE/ADA: New Consensus Statement on Inpatient Glycemic Control (2015)  Target Ranges:  Prepandial:   less than 140 mg/dL      Peak postprandial:   less than 180 mg/dL (1-2 hours)      Critically ill patients:  140 - 180 mg/dL   Lab Results  Component Value Date   GLUCAP 200 (H) 02/23/2020   HGBA1C 12.0 (H) 02/20/2020    Review of Glycemic Control  Diabetes history:DM2 Outpatient Diabetes medications:Ozempic .25 mg q week + Jardiance 10 mg qd Current orders for Inpatient glycemic control:Lantus 35 units qd + Jardiance 10 mg qd + Novolog 6 units tid meal coverage + Novolog sensitive tid + hs 0-5 units  Inpatient Diabetes Program Recommendations:   Spoke with patient via phone regarding diabetes management (DM coordinator @ [facility]). Spoke with pt about  A1C results with them 12.0 (average blood glucose 298 over the past 2-3 months), and explained what an A1C is, basic pathophysiology of DM Type 2, basic home care, basic diabetes diet nutrition principles, importance of checking CBGs and maintaining good CBG control to prevent long-term and short-term complications. Reviewed signs and symptoms of hyperglycemia and hypoglycemia and how to treat hypoglycemia at home. Also reviewed blood sugar goals at home.  RNs to provide ongoing basic DM education at bedside with this patient. Have ordered educational booklet and insulin starter kit.  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  Patient prefers pens on discharge. States he has been giving his own injections in the hospital and feels comfortable.  Thank you, Nani Gasser. Sammi Stolarz, RN, MSN, CDE  Diabetes Coordinator Inpatient Glycemic Control Team Team Pager [phone] (8am-5pm) 02/23/2020 1:39 PM

## 2020-02-24 LAB — GLUCOSE, CAPILLARY
Glucose-Capillary: 165 mg/dL — ABNORMAL HIGH (ref 70–99)
Glucose-Capillary: 184 mg/dL — ABNORMAL HIGH (ref 70–99)
Glucose-Capillary: 214 mg/dL — ABNORMAL HIGH (ref 70–99)
Glucose-Capillary: 203 mg/dL — ABNORMAL HIGH (ref 70–99)

## 2020-02-24 LAB — CBC WITH DIFFERENTIAL/PLATELET
Abs Immature Granulocytes: 0.04 10*3/uL (ref 0.00–0.07)
Basophils Absolute: 0.1 10*3/uL (ref 0.0–0.1)
Basophils Relative: 1 %
Eosinophils Absolute: 0.5 10*3/uL (ref 0.0–0.5)
Eosinophils Relative: 5 %
HCT: 44.2 % (ref 39.0–52.0)
Hemoglobin: 13.8 g/dL (ref 13.0–17.0)
Immature Granulocytes: 0 %
Lymphocytes Relative: 21 %
Lymphs Abs: 2 10*3/uL (ref 0.7–4.0)
MCH: 27.8 pg (ref 26.0–34.0)
MCHC: 31.2 g/dL (ref 30.0–36.0)
MCV: 88.9 fL (ref 80.0–100.0)
Monocytes Absolute: 0.9 10*3/uL (ref 0.1–1.0)
Monocytes Relative: 9 %
Neutro Abs: 6.2 10*3/uL (ref 1.7–7.7)
Neutrophils Relative %: 64 %
Platelets: 234 10*3/uL (ref 150–400)
RBC: 4.97 MIL/uL (ref 4.22–5.81)
RDW: 13.5 % (ref 11.5–15.5)
WBC: 9.8 10*3/uL (ref 4.0–10.5)
nRBC: 0 % (ref 0.0–0.2)

## 2020-02-24 LAB — BASIC METABOLIC PANEL
Anion gap: 10 (ref 5–15)
BUN: 39 mg/dL — ABNORMAL HIGH (ref 6–20)
CO2: 31 mmol/L (ref 22–32)
Calcium: 8 mg/dL — ABNORMAL LOW (ref 8.9–10.3)
Chloride: 93 mmol/L — ABNORMAL LOW (ref 98–111)
Creatinine, Ser: 1.77 mg/dL — ABNORMAL HIGH (ref 0.61–1.24)
GFR, Estimated: 46 mL/min — ABNORMAL LOW (ref >60)
Glucose, Bld: 198 mg/dL — ABNORMAL HIGH (ref 70–99)
Potassium: 4 mmol/L (ref 3.5–5.1)
Sodium: 134 mmol/L — ABNORMAL LOW (ref 135–145)

## 2020-02-24 MED ORDER — IPRATROPIUM-ALBUTEROL 0.5-2.5 (3) MG/3ML IN SOLN
3.0000 mL | Freq: Three times a day (TID) | RESPIRATORY_TRACT | Status: DC
  Administered 2020-02-24 – 2020-02-26 (×5): 3 mL via RESPIRATORY_TRACT
  Filled 2020-02-24 (×5): qty 3

## 2020-02-24 NOTE — Progress Notes (Signed)
Patient ID: Jeremiah Vance, male   DOB: [birth year], 49 y.o.   MRN: [id number] [facility] PROGRESS NOTE  Jeremiah Vance YKZ:[id number] DOB: 02-04-1971 DOA: 02/19/2020 PCP: Theadore Nan, NP  HPI/Subjective: No specific complaints today Feels about 60% of his normal self Using nebulizer on my arrival to room but off oxygen and states was walking to restroom  Objective: Vitals:   02/24/20 0840 02/24/20 1149  BP:  132/63  Pulse: 64 (!) 54  Resp:    Temp:  97.8 F (36.6 C)  SpO2:  92%    Intake/Output Summary (Last 24 hours) at 02/24/2020 1336 Last data filed at 02/24/2020 0940 Gross per 24 hour  Intake 720 ml  Output 200 ml  Net 520 ml   Filed Weights   02/22/20 0325 02/23/20 0529 02/24/20 0410  Weight: (!) 173.9 kg (!) 173.1 kg (!) 175 kg    ROS: Review of Systems  Respiratory: Positive for cough and shortness of breath.   Cardiovascular: Negative for chest pain.  Gastrointestinal: Positive for abdominal pain. Negative for nausea and vomiting.   Exam:  EOMI NCAT coherent no distress Mild JVD No rales no rhonchi no wheeze Abdomen slightly tender  Cannot appreciate liver Grade grade 1 lower extremity edema Neurologically intact moving 4 limbs equally without focal deficit-reflexes deferred   Data Reviewed: Basic Metabolic Panel: Recent Labs  Lab 02/19/20 2214 02/20/20 0028 02/20/20 0514 02/21/20 0415 02/22/20 0614 02/23/20 0429 02/24/20 0715  NA   < >  --  135 136 136 139 134*  K   < >  --  4.1 4.1 3.9 3.8 4.0  CL   < >  --  99 96* 95* 95* 93*  CO2   < >  --  25 30 30  33* 31  GLUCOSE   < >  --  317* 225* 211* 145* 198*  BUN   < >  --  17 21* 30* 35* 39*  CREATININE   < >  --  1.33* 1.56* 1.79* 1.89* 1.77*  CALCIUM   < >  --  8.5* 8.0* 8.0* 8.2* 8.0*  MG  --  2.0  --   --   --   --   --    < > = values in this interval not displayed.   Liver Function Tests: Recent Labs  Lab 02/19/20 2214  AST 21  ALT 22  ALKPHOS 89  BILITOT 0.8   PROT 7.3  ALBUMIN 3.4*   CBC: Recent Labs  Lab 02/19/20 2214 02/20/20 0514 02/23/20 0429 02/24/20 0715  WBC 10.3 9.2 11.0* 9.8  NEUTROABS  --   --   --  6.2  HGB 14.2 14.0 14.1 13.8  HCT 45.3 44.1 45.0 44.2  MCV 88.0 86.8 89.1 88.9  PLT 260 244 258 234   BNP (last 3 results) Recent Labs    07/08/19 0938 02/19/20 2214  BNP 219.0* 267.9*    CBG: Recent Labs  Lab 02/23/20 1204 02/23/20 1709 02/23/20 2103 02/24/20 0749 02/24/20 1152  GLUCAP 200* 173* 285* 165* 184*    Recent Results (from the past 240 hour(s))  Resp Panel by RT-PCR (Flu A&B, Covid) Nasopharyngeal Swab     Status: None   Collection Time: 02/19/20 11:28 PM   Specimen: Nasopharyngeal Swab; Nasopharyngeal(NP) swabs in vial transport medium  Result Value Ref Range Status   SARS Coronavirus 2 by RT PCR NEGATIVE NEGATIVE Final    Comment: (NOTE) SARS-CoV-2 target nucleic acids are NOT DETECTED.  The SARS-CoV-2 RNA is generally detectable in upper respiratory specimens during the acute phase of infection. The lowest concentration of SARS-CoV-2 viral copies this assay can detect is 138 copies/mL. A negative result does not preclude SARS-Cov-2 infection and should not be used as the sole basis for treatment or other patient management decisions. A negative result may occur with  improper specimen collection/handling, submission of specimen other than nasopharyngeal swab, presence of viral mutation(s) within the areas targeted by this assay, and inadequate number of viral copies(<138 copies/mL). A negative result must be combined with clinical observations, patient history, and epidemiological information. The expected result is Negative.  Fact Sheet for Patients:  BloggerCourse.com  Fact Sheet for Healthcare Providers:  SeriousBroker.it  This test is no t yet approved or cleared by the [place] FDA and  has been authorized for detection and/or  diagnosis of SARS-CoV-2 by FDA under an Emergency Use Authorization (EUA). This EUA will remain  in effect (meaning this test can be used) for the duration of the COVID-19 declaration under Section 564(b)(1) of the Act, 21 U.S.C.section 360bbb-3(b)(1), unless the authorization is terminated  or revoked sooner.       Influenza A by PCR NEGATIVE NEGATIVE Final   Influenza B by PCR NEGATIVE NEGATIVE Final    Comment: (NOTE) The Xpert Xpress SARS-CoV-2/FLU/RSV plus assay is intended as an aid in the diagnosis of influenza from Nasopharyngeal swab specimens and should not be used as a sole basis for treatment. Nasal washings and aspirates are unacceptable for Xpert Xpress SARS-CoV-2/FLU/RSV testing.  Fact Sheet for Patients: BloggerCourse.com  Fact Sheet for Healthcare Providers: SeriousBroker.it  This test is not yet approved or cleared by the [place] FDA and has been authorized for detection and/or diagnosis of SARS-CoV-2 by FDA under an Emergency Use Authorization (EUA). This EUA will remain in effect (meaning this test can be used) for the duration of the COVID-19 declaration under Section 564(b)(1) of the Act, 21 U.S.C. section 360bbb-3(b)(1), unless the authorization is terminated or revoked.  Performed at [facility], [address] [place], [place] [address]      Studies: No results found.  Scheduled Meds: . aspirin  81 mg Oral Daily  . atorvastatin  40 mg Oral q1800  . carvedilol  18.75 mg Oral BID WC  . empagliflozin  10 mg Oral q morning - 10a  . enoxaparin (LOVENOX) injection  0.5 mg/kg Subcutaneous Q24H  . furosemide  40 mg Intravenous Daily  . insulin aspart  0-5 Units Subcutaneous QHS  . insulin aspart  0-9 Units Subcutaneous TID WC  . insulin aspart  5 Units Subcutaneous TID WC  . insulin glargine  35 Units Subcutaneous QHS  . ipratropium-albuterol  3 mL Nebulization Q6H  . losartan  50  mg Oral Daily  . sodium chloride flush  3 mL Intravenous Q12H   Continuous Infusions: . sodium chloride      Assessment/Plan:  1. Acute diastolic congestive heart failure.  Cardiology switched twice daily to once daily Lasix IV and likely will transition to oral medications-continue Coreg and losartan.  Cardiology also started [place].  Cough seems about the same without sputum 2. Acute kidney injury stage IIIa on chronic kidney disease stage II.  Monitor closely with diuresis.  BUN/creatinine 3 39/1.7 3. Cough.  Continue Tussionex and nebulizer treatments.  Chest x-ray did not show any pneumonia. 4. Acute hypoxic respiratory failure.  Pulse ox 87% on room air on presentation.  Continue to attempt to wean as  tolerated desat screen ordered 5. Type 2 diabetes mellitus uncontrolled with hyperlipidemia.  Continue atorvastatin.  Continue Lantus insulin 35 units daily.  Hemoglobin A1c very elevated at 12.0.  Continue short acting insulin prior to meals. 6. Morbid obesity with a BMI of 55.0.  Continue to monitor daily weights with diuresis. 7. Neck pain.  Trial of Flexeril-no complaints today and is more mobile 8. Possible cirrhosis of liver seen on CT scan of the abdomen.  Hepatitis B profile and hepatitis C negative. 9. Large ventral hernia with loops of small bowel seen on CT scan but no obstruction or incarceration seen.  Patient did have prior surgery in [facility] and recommend getting back to his surgeon as outpatient once respiratory status better.  Coughing has made his hernia worse.  Try to suppress cough.      Code Status:     Code Status Orders  (From admission, onward)         Start     Ordered   02/20/20 0128  Full code  Continuous        02/20/20 0130        Code Status History    Date Active Date Inactive Code Status Order ID Comments User Context   07/08/2019 1419 07/09/2019 1637 Full Code [id number]  Lorretta Harp, MD ED   10/21/2017 2254 10/28/2017 1857 Full Code [id number]   Axel Filler, MD ED   09/14/2017 1440 09/17/2017 1941 Full Code [id number]  Lovena Neighbours, MD ED   09/06/2017 1929 09/08/2017 2113 Full Code [id number]  Myrene Buddy, MD ED   07/10/2017 1947 07/18/2017 2142 Full Code [id number]  Bobette Mo, MD ED   Advance Care Planning Activity     Family Communication: No family available at bedside Disposition Plan: Status is: Inpatient  Dispo: The patient is from: Home              Anticipated d/c is to: Home              Anticipated d/c date is: Likely a.m. 1126 versus 11/27              Patient currently receiving IV diuresis.  Improving slowly  Consultants:  Cardiology  Time spent: 22 minutes  Jai-Gurmukh [facility]  [facility]

## 2020-02-24 NOTE — Plan of Care (Signed)
Pt resting in bed comfortably He appears to be in no apparent distress Able to ambulate to the restroom on room air. 88-89% Resting comfortably on 2L [place] 92% Patient denies any pain  Assessment completed as charted.  Sister at bedside and updated on plan of care Time allowed for questions and concerns Denies any additional wants or needs at this time Call bell remains within reach Will continue to closely monitor.   Problem: Education: Goal: Knowledge of General Education information will improve Description: Including pain rating scale, medication(s)/side effects and non-pharmacologic comfort measures Outcome: Progressing   Problem: Health Behavior/Discharge Planning: Goal: Ability to manage health-related needs will improve Outcome: Progressing   Problem: Clinical Measurements: Goal: Ability to maintain clinical measurements within normal limits will improve Outcome: Progressing Goal: Will remain free from infection Outcome: Progressing Goal: Diagnostic test results will improve Outcome: Progressing Goal: Respiratory complications will improve Outcome: Progressing Goal: Cardiovascular complication will be avoided Outcome: Progressing   Problem: Activity: Goal: Risk for activity intolerance will decrease Outcome: Progressing   Problem: Nutrition: Goal: Adequate nutrition will be maintained Outcome: Progressing   Problem: Coping: Goal: Level of anxiety will decrease Outcome: Progressing   Problem: Elimination: Goal: Will not experience complications related to bowel motility Outcome: Progressing Goal: Will not experience complications related to urinary retention Outcome: Progressing   Problem: Pain Managment: Goal: General experience of comfort will improve Outcome: Progressing   Problem: Safety: Goal: Ability to remain free from injury will improve Outcome: Progressing   Problem: Skin Integrity: Goal: Risk for impaired skin integrity will  decrease Outcome: Progressing   Problem: Education: Goal: Ability to demonstrate management of disease process will improve Outcome: Progressing Goal: Ability to verbalize understanding of medication therapies will improve Outcome: Progressing Goal: Individualized Educational Video(s) Outcome: Progressing   Problem: Activity: Goal: Capacity to carry out activities will improve Outcome: Progressing   Problem: Cardiac: Goal: Ability to achieve and maintain adequate cardiopulmonary perfusion will improve Outcome: Progressing   Problem: Education: Goal: Ability to describe self-care measures that may prevent or decrease complications (Diabetes Survival Skills Education) will improve Outcome: Progressing Goal: Individualized Educational Video(s) Outcome: Progressing   Problem: Coping: Goal: Ability to adjust to condition or change in health will improve Outcome: Progressing   Problem: Fluid Volume: Goal: Ability to maintain a balanced intake and output will improve Outcome: Progressing   Problem: Health Behavior/Discharge Planning: Goal: Ability to identify and utilize available resources and services will improve Outcome: Progressing   Problem: Metabolic: Goal: Ability to maintain appropriate glucose levels will improve Outcome: Progressing   Problem: Nutritional: Goal: Maintenance of adequate nutrition will improve Outcome: Progressing Goal: Progress toward achieving an optimal weight will improve Outcome: Progressing   Problem: Skin Integrity: Goal: Risk for impaired skin integrity will decrease Outcome: Progressing   Problem: Tissue Perfusion: Goal: Adequacy of tissue perfusion will improve Outcome: Progressing

## 2020-02-24 NOTE — Plan of Care (Signed)
  Problem: Metabolic: Goal: Ability to maintain appropriate glucose levels will improve Outcome: Not Progressing Pt reports  administering insulin in the past to faimily members. Declines to self administer insulin this shift.  Diabetes booklet reviewed. Pt reports understanding but failed teach back.

## 2020-02-24 NOTE — Progress Notes (Signed)
Progress Note  Patient Name: Jeremiah Vance Date of Encounter: 02/24/2020  [facility] Cardiologist: Debbe Odea, MD   Subjective   Shortness of breath continues to improve daily. Exertion typically cause him to be out of breath. Creatinine continues to improve slightly.  Inpatient Medications    Scheduled Meds: . aspirin  81 mg Oral Daily  . atorvastatin  40 mg Oral q1800  . carvedilol  18.75 mg Oral BID WC  . empagliflozin  10 mg Oral q morning - 10a  . enoxaparin (LOVENOX) injection  0.5 mg/kg Subcutaneous Q24H  . furosemide  40 mg Intravenous Daily  . insulin aspart  0-5 Units Subcutaneous QHS  . insulin aspart  0-9 Units Subcutaneous TID WC  . insulin aspart  5 Units Subcutaneous TID WC  . insulin glargine  35 Units Subcutaneous QHS  . ipratropium-albuterol  3 mL Nebulization Q6H  . losartan  50 mg Oral Daily  . sodium chloride flush  3 mL Intravenous Q12H   Continuous Infusions: . sodium chloride     PRN Meds: sodium chloride, acetaminophen **OR** acetaminophen, albuterol, benzonatate, chlorpheniramine-HYDROcodone, cyclobenzaprine, labetalol, magnesium hydroxide, ondansetron **OR** ondansetron (ZOFRAN) IV, sodium chloride flush, traZODone   Vital Signs    Vitals:   02/24/20 0410 02/24/20 0748 02/24/20 0840 02/24/20 1149  BP: 130/65 136/78  132/63  Pulse: (!) 52 (!) 53 64 (!) 54  Resp:      Temp: 98.4 F (36.9 C) 98.6 F (37 C)  97.8 F (36.6 C)  TempSrc: Oral Oral  Oral  SpO2: 96% 94%  92%  Weight: (!) 175 kg     Height:        Intake/Output Summary (Last 24 hours) at 02/24/2020 1206 Last data filed at 02/24/2020 0940 Gross per 24 hour  Intake 720 ml  Output 200 ml  Net 520 ml   Last 3 Weights 02/24/2020 02/23/2020 02/22/2020  Weight (lbs) 385 lb 14.4 oz 381 lb 9.6 oz 383 lb 4.8 oz  Weight (kg) 175.043 kg 173.093 kg 173.864 kg      Telemetry    Sinus rhythm- Personally Reviewed  ECG    No new tracing- Personally  Reviewed  Physical Exam   GEN: No acute distress.   Neck: No JVD Cardiac: RRR, no murmurs, rubs, or gallops.  Respiratory:  Diminished breath sounds at bases GI: Soft, nontender, non-distended  MS: trace-1+ edema; No deformity. Neuro:  Nonfocal  Psych: Normal affect   Labs    High Sensitivity Troponin:   Recent Labs  Lab 02/19/20 2214 02/20/20 0028  TROPONINIHS 37* 38*      Chemistry Recent Labs  Lab 02/19/20 2214 02/20/20 0514 02/22/20 0614 02/23/20 0429 02/24/20 0715  NA 135   < > 136 139 134*  K 4.4   < > 3.9 3.8 4.0  CL 98   < > 95* 95* 93*  CO2 26   < > 30 33* 31  GLUCOSE 385*   < > 211* 145* 198*  BUN 17   < > 30* 35* 39*  CREATININE 1.41*   < > 1.79* 1.89* 1.77*  CALCIUM 8.5*   < > 8.0* 8.2* 8.0*  PROT 7.3  --   --   --   --   ALBUMIN 3.4*  --   --   --   --   AST 21  --   --   --   --   ALT 22  --   --   --   --  ALKPHOS 89  --   --   --   --   BILITOT 0.8  --   --   --   --   GFRNONAA >60   < > 46* 43* 46*  ANIONGAP 11   < > 11 11 10    < > = values in this interval not displayed.     Hematology Recent Labs  Lab 02/20/20 0514 02/23/20 0429 02/24/20 0715  WBC 9.2 11.0* 9.8  RBC 5.08 5.05 4.97  HGB 14.0 14.1 13.8  HCT 44.1 45.0 44.2  MCV 86.8 89.1 88.9  MCH 27.6 27.9 27.8  MCHC 31.7 31.3 31.2  RDW 13.3 13.5 13.5  PLT 244 258 234    BNP Recent Labs  Lab 02/19/20 2214  BNP 267.9*     DDimer No results for input(s): DDIMER in the last 168 hours.   Radiology    No results found.  Cardiac Studies   Echo 07/2019 1. Left ventricular ejection fraction, by estimation, is 50 to 55%. The  left ventricle has low normal function. The left ventricle demonstrates  global hypokinesis. There is mild left ventricular hypertrophy. Left  ventricular diastolic parameters are  consistent with Grade II diastolic dysfunction (pseudonormalization).  2. Right ventricular systolic function is normal. The right ventricular  size is mildly enlarged.  Tricuspid regurgitation signal is inadequate for  assessing PA pressure.  3. Left atrial size was mildly dilated.  Patient Profile     49 y.o. male with history of heart failure preserved ejection fraction, CKD 3, hypertension who presents with elevated blood pressures and volume overload.  Assessment & Plan    1.  HFpEF, -feels a little better -Cr slightly improved -cont Lasix 40 mg IV today. Switch to po torsemide tomorrow 40mg  bid -Monitor creatinine  2.  Hypertension -BP controlled -Coreg, losartan.  3.  CKD stage III -Monitor creatinine with diuresing - Lasix as above  4.  Morbid obesity -Deconditioning/morbid obesity definitely contributing to patient's symptoms. -Recommend PT OT.   Greater than 50% was spent in counseling and coordination of care with patient Total encounter time 35 minutes       Signed, 43, MD  02/24/2020, 12:06 PM

## 2020-02-25 LAB — CBC WITH DIFFERENTIAL/PLATELET
Abs Immature Granulocytes: 0.03 10*3/uL (ref 0.00–0.07)
Basophils Absolute: 0 10*3/uL (ref 0.0–0.1)
Basophils Relative: 0 %
Eosinophils Absolute: 0.6 10*3/uL — ABNORMAL HIGH (ref 0.0–0.5)
Eosinophils Relative: 6 %
HCT: 42.6 % (ref 39.0–52.0)
Hemoglobin: 13.6 g/dL (ref 13.0–17.0)
Immature Granulocytes: 0 %
Lymphocytes Relative: 23 %
Lymphs Abs: 2.4 10*3/uL (ref 0.7–4.0)
MCH: 28 pg (ref 26.0–34.0)
MCHC: 31.9 g/dL (ref 30.0–36.0)
MCV: 87.8 fL (ref 80.0–100.0)
Monocytes Absolute: 0.9 10*3/uL (ref 0.1–1.0)
Monocytes Relative: 9 %
Neutro Abs: 6.4 10*3/uL (ref 1.7–7.7)
Neutrophils Relative %: 62 %
Platelets: 252 10*3/uL (ref 150–400)
RBC: 4.85 MIL/uL (ref 4.22–5.81)
RDW: 13.3 % (ref 11.5–15.5)
WBC: 10.4 10*3/uL (ref 4.0–10.5)
nRBC: 0 % (ref 0.0–0.2)

## 2020-02-25 LAB — COMPREHENSIVE METABOLIC PANEL
ALT: 16 U/L (ref 0–44)
AST: 16 U/L (ref 15–41)
Albumin: 3.1 g/dL — ABNORMAL LOW (ref 3.5–5.0)
Alkaline Phosphatase: 65 U/L (ref 38–126)
Anion gap: 11 (ref 5–15)
BUN: 41 mg/dL — ABNORMAL HIGH (ref 6–20)
CO2: 32 mmol/L (ref 22–32)
Calcium: 8.1 mg/dL — ABNORMAL LOW (ref 8.9–10.3)
Chloride: 94 mmol/L — ABNORMAL LOW (ref 98–111)
Creatinine, Ser: 1.7 mg/dL — ABNORMAL HIGH (ref 0.61–1.24)
GFR, Estimated: 49 mL/min — ABNORMAL LOW (ref >60)
Glucose, Bld: 176 mg/dL — ABNORMAL HIGH (ref 70–99)
Potassium: 4 mmol/L (ref 3.5–5.1)
Sodium: 137 mmol/L (ref 135–145)
Total Bilirubin: 0.9 mg/dL (ref 0.3–1.2)
Total Protein: 6.9 g/dL (ref 6.5–8.1)

## 2020-02-25 LAB — GLUCOSE, CAPILLARY
Glucose-Capillary: 152 mg/dL — ABNORMAL HIGH (ref 70–99)
Glucose-Capillary: 205 mg/dL — ABNORMAL HIGH (ref 70–99)
Glucose-Capillary: 135 mg/dL — ABNORMAL HIGH (ref 70–99)
Glucose-Capillary: 101 mg/dL — ABNORMAL HIGH (ref 70–99)

## 2020-02-25 MED ORDER — PREDNISONE 20 MG PO TABS
20.0000 mg | ORAL_TABLET | Freq: Every day | ORAL | Status: DC
  Administered 2020-02-26: 20 mg via ORAL
  Filled 2020-02-25: qty 1

## 2020-02-25 MED ORDER — BUDESONIDE 0.5 MG/2ML IN SUSP
1.0000 mg | Freq: Two times a day (BID) | RESPIRATORY_TRACT | Status: DC
  Administered 2020-02-25 – 2020-02-26 (×2): 1 mg via RESPIRATORY_TRACT
  Filled 2020-02-25 (×3): qty 4

## 2020-02-25 MED ORDER — TIOTROPIUM BROMIDE MONOHYDRATE 18 MCG IN CAPS: 18.0000 ug | ORAL_CAPSULE | Freq: Every day | RESPIRATORY_TRACT | Status: DC

## 2020-02-25 MED ORDER — TORSEMIDE 20 MG PO TABS
40.0000 mg | ORAL_TABLET | Freq: Two times a day (BID) | ORAL | Status: DC
  Administered 2020-02-25 – 2020-02-26 (×2): 40 mg via ORAL
  Filled 2020-02-25 (×2): qty 2

## 2020-02-25 NOTE — Progress Notes (Addendum)
Progress Note  Patient Name: Jeremiah Vance Date of Encounter: 02/25/2020  [facility] Cardiologist: Debbe Odea, MD   Subjective   Denies chest pain.   Reports improved SOB.  Reports ongoing productive cough with yellow sputum.  Unable to provide details regarding specifics of progression of shortness of breath or lower extremity edema.   Unable to provide details regarding specifics of improvement in shortness of breath and edema since this admission.    He does not use oxygen at home.  He does not always remember to collect his outs/urine in the hat / container and thus suspect I/Os inaccurate.  Inpatient Medications    Scheduled Meds: . aspirin  81 mg Oral Daily  . atorvastatin  40 mg Oral q1800  . carvedilol  18.75 mg Oral BID WC  . empagliflozin  10 mg Oral q morning - 10a  . enoxaparin (LOVENOX) injection  0.5 mg/kg Subcutaneous Q24H  . furosemide  40 mg Intravenous Daily  . insulin aspart  0-5 Units Subcutaneous QHS  . insulin aspart  0-9 Units Subcutaneous TID WC  . insulin aspart  5 Units Subcutaneous TID WC  . insulin glargine  35 Units Subcutaneous QHS  . ipratropium-albuterol  3 mL Nebulization TID  . losartan  50 mg Oral Daily  . sodium chloride flush  3 mL Intravenous Q12H   Continuous Infusions: . sodium chloride     PRN Meds: sodium chloride, acetaminophen **OR** acetaminophen, albuterol, benzonatate, chlorpheniramine-HYDROcodone, cyclobenzaprine, labetalol, magnesium hydroxide, ondansetron **OR** ondansetron (ZOFRAN) IV, sodium chloride flush, traZODone   Vital Signs    Vitals:   02/24/20 2043 02/25/20 0120 02/25/20 0420 02/25/20 0811  BP:   137/75 135/84  Pulse:   (!) 57 (!) 53  Resp:   20 18  Temp:   98.7 F (37.1 C) (!) 97.5 F (36.4 C)  TempSrc:   Oral Oral  SpO2: 93%  96% 94%  Weight:  (!) 174.8 kg    Height:        Intake/Output Summary (Last 24 hours) at 02/25/2020 0909 Last data filed at 02/24/2020 1815 Gross per 24  hour  Intake 960 ml  Output --  Net 960 ml   Last 3 Weights 02/25/2020 02/24/2020 02/23/2020  Weight (lbs) 385 lb 6.4 oz 385 lb 14.4 oz 381 lb 9.6 oz  Weight (kg) 174.816 kg 175.043 kg 173.093 kg      Telemetry    Sinus bradycardia-sinus rhythm, PAC, rates 50s to 60s- Personally Reviewed  ECG    No new tracings- Personally Reviewed  Physical Exam   GEN:  Morbidly obese.  Sleeping upon entry into the room.  No acute distress.   Neck:  JVD difficult to assess due to body habitus. Cardiac:  Distant heart sounds, bradycardic and regular, no murmurs, rubs, or gallops.  Respiratory:  Distant breath sounds, bilateral scattered wheezes and rhonchi. GI:  Obese, soft, nontender, non-distended  MS: No pitting edema; No deformity. Neuro:  Nonfocal  Psych: Normal affect   Labs    High Sensitivity Troponin:   Recent Labs  Lab 02/19/20 2214 02/20/20 0028  TROPONINIHS 37* 38*      Chemistry Recent Labs  Lab 02/19/20 2214 02/20/20 0514 02/23/20 0429 02/24/20 0715 02/25/20 0541  NA 135   < > 139 134* 137  K 4.4   < > 3.8 4.0 4.0  CL 98   < > 95* 93* 94*  CO2 26   < > 33* 31 32  GLUCOSE 385*   < >  145* 198* 176*  BUN 17   < > 35* 39* 41*  CREATININE 1.41*   < > 1.89* 1.77* 1.70*  CALCIUM 8.5*   < > 8.2* 8.0* 8.1*  PROT 7.3  --   --   --  6.9  ALBUMIN 3.4*  --   --   --  3.1*  AST 21  --   --   --  16  ALT 22  --   --   --  16  ALKPHOS 89  --   --   --  65  BILITOT 0.8  --   --   --  0.9  GFRNONAA >60   < > 43* 46* 49*  ANIONGAP 11   < > 11 10 11    < > = values in this interval not displayed.     Hematology Recent Labs  Lab 02/23/20 0429 02/24/20 0715 02/25/20 0541  WBC 11.0* 9.8 10.4  RBC 5.05 4.97 4.85  HGB 14.1 13.8 13.6  HCT 45.0 44.2 42.6  MCV 89.1 88.9 87.8  MCH 27.9 27.8 28.0  MCHC 31.3 31.2 31.9  RDW 13.5 13.5 13.3  PLT 258 234 252    BNP Recent Labs  Lab 02/19/20 2214  BNP 267.9*     DDimer No results for input(s): DDIMER in the last 168  hours.   Radiology    No results found.  Cardiac Studies   2D echo 07/2019: 1. Left ventricular ejection fraction, by estimation, is 50 to 55%. The  left ventricle has low normal function. The left ventricle demonstrates  global hypokinesis. There is mild left ventricular hypertrophy. Left  ventricular diastolic parameters are  consistent with Grade II diastolic dysfunction (pseudonormalization).  2. Right ventricular systolic function is normal. The right ventricular  size is mildly enlarged. Tricuspid regurgitation signal is inadequate for  assessing PA pressure.  3. Left atrial size was mildly dilated.  Patient Profile     49 y.o. male with history of HFpEF, CVA 08/2017, CKD 3, poorly controlled DM2, hypertension, OSA nonadherent to CPAP, and admitted with hypertensive urgency and acute on chronic HFpEF.  Assessment & Plan    Chronic HFpEF --Continues to report cough.  States his edema is improved but unable to provide specific details regarding the amount of edema or SOB/DOE at the time of his admission or level of improvement since that time. Suspect current SOB/DOE is multifactorial and possibly 2/2 underlying lung dz/ asthma /infection as current presentation less consistent with volume overload given wheezing on exam and as pt as euvolemic on exam today though with consideration of body habitus. No plan to schedule upcoming RHC at this time. Continue to reassess during admission. Most recent EF low nl at 50-55% as above. --Consider start of nebulizer/steroid/antibiotic tx if indicated and per IM. --Agree with start of oral torsemide 40 mg twice daily. --Discontinued IV lasix. --Started torsemide 40mg  BID.  --Daily BMET. Cr 1.77  1.70. Slightly elevated from baseline Cr ~ 1.4-1.7. --Monitor I's/O's, which are not well recorded at this time and suspect not accurate. --Daily standing weights. Wt 175 kg  174.8kg. --CHF education.  --Will need OP follow-up. At that time,  consider transition from [facility] to [facility] if Cr allows and still room in BP.  HTN --BP borderline at 135/84.   --Continue carvedilol and losartan. --Transitioned to torsemide 40mg  BID as above. --Escalation of losartan precluded by Cr/AKI. --Continue daily BMET as above.  --Increase of carvedilol precluded by bradycardia. --As above, as OP,  consider transition from losartan to [facility] if creatinine allows and still room in BP.  CKD stage III --Cr still elevated from baseline. --Daily BMET.  Morbid obesity --Recommend heart healthy diet and increasing activity as tolerated.  PT/OT.    For questions or updates, please contact [facility] Please consult [email or web address] for contact info under        Signed, Lennon Alstrom, PA-C  02/25/2020, 9:09 AM

## 2020-02-25 NOTE — TOC Transition Note (Signed)
Transition of Care [facility]) - CM/SW Discharge Note   Patient Details  Name: MESSI TWEDT MRN: [id number] Date of Birth: 1970/09/04  Transition of Care [facility]) CM/SW Contact:  Eilleen Kempf, LCSW Phone Number: 02/25/2020, 8:18 AM   Clinical Narrative:    CSW requested medications sent over to med management today, as they will be closed until Monday. CSW spoke with Keri, picked up medications and delivered to [facility] supervisor/Susan to hold until discharge. A few of the medications prescribed are not available at med mg clinic, those can be covered by match at discharge. CSW spoke with caregiver/sister, [name] patient lives with. CSW explained about [employer] app needing to be completed asap and returned. CSW emailed [name] a copy to [email or web address] and she agreed to complete and fax to [employer].  In addition: Patient has recently become employed by [employer] and has signed up for their insurance when eligible.          Patient Goals and CMS Choice  To get better and return to work at new job with [employer].      Discharge Placement  Patient will return home with sister [facility]                                     Social Determinants of Health (SDOH) Interventions     Readmission Risk Interventions No flowsheet data found.

## 2020-02-25 NOTE — TOC Progression Note (Signed)
Transition of Care [facility]) - Progression Note    Patient Details  Name: Jeremiah Vance MRN: [id number] Date of Birth: [birth year]  Transition of Care [facility]) CM/SW Contact  [place] Cellar, RN Phone Number: 02/25/2020, 2:57 PM  Clinical Narrative:    Left medications from med mgmt with Philippa Chester, RN who advised they would send to pharmacy for holding. Also included Match Letter for patient to take to pharmacy for medications not available via med mgmt. Possible discharge over the weekend.         Expected Discharge Plan and Services                                                 Social Determinants of Health (SDOH) Interventions    Readmission Risk Interventions No flowsheet data found.

## 2020-02-25 NOTE — Progress Notes (Signed)
Patient ID: Jeremiah Vance, male   DOB: April 08, 1970, 49 y.o.   MRN: [id number] [facility] PROGRESS NOTE  Jeremiah Vance GMW:[id number] DOB: July 16, 1970 DOA: 02/19/2020 PCP: Theadore Nan, NP  HPI/Subjective: Still feels short of breath but thinks that "something is trying to come up" O2 sats on room air 88% No chest pain no fever Continuously seems to suck on the nebulizer despite it being empty Tells me his brother-in-law smokes all the time-patient does not have any occupational exposure suggestive of pneumoconiosis Has never been told he has reactive airway disease asthma or any other process We had a good discussion today and I told him we would probably try some other medications like steroids and a different inhaler  Objective: Vitals:   02/25/20 0811 02/25/20 1135  BP: 135/84 112/60  Pulse: (!) 53 (!) 49  Resp: 18 18  Temp: (!) 97.5 F (36.4 C) 98 F (36.7 C)  SpO2: 94% 94%    Intake/Output Summary (Last 24 hours) at 02/25/2020 1429 Last data filed at 02/25/2020 1017 Gross per 24 hour  Intake 480 ml  Output --  Net 480 ml   Filed Weights   02/23/20 0529 02/24/20 0410 02/25/20 0120  Weight: (!) 173.1 kg (!) 175 kg (!) 174.8 kg    ROS: Review of Systems  Respiratory: Positive for cough and shortness of breath.   Cardiovascular: Negative for chest pain.  Gastrointestinal: Positive for abdominal pain. Negative for nausea and vomiting.   Exam:  EOMI NCAT coherent no distress Mild JVD Decreased air entry, faint wheeze, almost not really discernible Abdomen slightly tender no rebound no guarding Lower extremity edema persists grade 1 with venous stasis changes ROM intact, power 5/5   Data Reviewed: Basic Metabolic Panel: Recent Labs  Lab 02/20/20 0028 02/20/20 0514 02/21/20 0415 02/22/20 0614 02/23/20 0429 02/24/20 0715 02/25/20 0541  NA  --    < > 136 136 139 134* 137  K  --    < > 4.1 3.9 3.8 4.0 4.0  CL  --    < > 96* 95* 95* 93* 94*  CO2   --    < > 30 30 33* 31 32  GLUCOSE  --    < > 225* 211* 145* 198* 176*  BUN  --    < > 21* 30* 35* 39* 41*  CREATININE  --    < > 1.56* 1.79* 1.89* 1.77* 1.70*  CALCIUM  --    < > 8.0* 8.0* 8.2* 8.0* 8.1*  MG 2.0  --   --   --   --   --   --    < > = values in this interval not displayed.   Liver Function Tests: Recent Labs  Lab 02/19/20 2214 02/25/20 0541  AST 21 16  ALT 22 16  ALKPHOS 89 65  BILITOT 0.8 0.9  PROT 7.3 6.9  ALBUMIN 3.4* 3.1*   CBC: Recent Labs  Lab 02/19/20 2214 02/20/20 0514 02/23/20 0429 02/24/20 0715 02/25/20 0541  WBC 10.3 9.2 11.0* 9.8 10.4  NEUTROABS  --   --   --  6.2 6.4  HGB 14.2 14.0 14.1 13.8 13.6  HCT 45.3 44.1 45.0 44.2 42.6  MCV 88.0 86.8 89.1 88.9 87.8  PLT 260 244 258 234 252   BNP (last 3 results) Recent Labs    07/08/19 0938 02/19/20 2214  BNP 219.0* 267.9*    CBG: Recent Labs  Lab 02/24/20 1152 02/24/20 1617 02/24/20 2049 02/25/20  1779 02/25/20 1136  GLUCAP 184* 214* 203* 152* 205*    Recent Results (from the past 240 hour(s))  Resp Panel by RT-PCR (Flu A&B, Covid) Nasopharyngeal Swab     Status: None   Collection Time: 02/19/20 11:28 PM   Specimen: Nasopharyngeal Swab; Nasopharyngeal(NP) swabs in vial transport medium  Result Value Ref Range Status   SARS Coronavirus 2 by RT PCR NEGATIVE NEGATIVE Final    Comment: (NOTE) SARS-CoV-2 target nucleic acids are NOT DETECTED.  The SARS-CoV-2 RNA is generally detectable in upper respiratory specimens during the acute phase of infection. The lowest concentration of SARS-CoV-2 viral copies this assay can detect is 138 copies/mL. A negative result does not preclude SARS-Cov-2 infection and should not be used as the sole basis for treatment or other patient management decisions. A negative result may occur with  improper specimen collection/handling, submission of specimen other than nasopharyngeal swab, presence of viral mutation(s) within the areas targeted by this  assay, and inadequate number of viral copies(<138 copies/mL). A negative result must be combined with clinical observations, patient history, and epidemiological information. The expected result is Negative.  Fact Sheet for Patients:  BloggerCourse.com  Fact Sheet for Healthcare Providers:  SeriousBroker.it  This test is no t yet approved or cleared by the [place] FDA and  has been authorized for detection and/or diagnosis of SARS-CoV-2 by FDA under an Emergency Use Authorization (EUA). This EUA will remain  in effect (meaning this test can be used) for the duration of the COVID-19 declaration under Section 564(b)(1) of the Act, 21 U.S.C.section 360bbb-3(b)(1), unless the authorization is terminated  or revoked sooner.       Influenza A by PCR NEGATIVE NEGATIVE Final   Influenza B by PCR NEGATIVE NEGATIVE Final    Comment: (NOTE) The Xpert Xpress SARS-CoV-2/FLU/RSV plus assay is intended as an aid in the diagnosis of influenza from Nasopharyngeal swab specimens and should not be used as a sole basis for treatment. Nasal washings and aspirates are unacceptable for Xpert Xpress SARS-CoV-2/FLU/RSV testing.  Fact Sheet for Patients: BloggerCourse.com  Fact Sheet for Healthcare Providers: SeriousBroker.it  This test is not yet approved or cleared by the [place] FDA and has been authorized for detection and/or diagnosis of SARS-CoV-2 by FDA under an Emergency Use Authorization (EUA). This EUA will remain in effect (meaning this test can be used) for the duration of the COVID-19 declaration under Section 564(b)(1) of the Act, 21 U.S.C. section 360bbb-3(b)(1), unless the authorization is terminated or revoked.  Performed at [facility], [address] [place], [place] [address]      Studies: No results found.  Scheduled Meds: . aspirin  81 mg Oral  Daily  . atorvastatin  40 mg Oral q1800  . budesonide  1 mg Nebulization BID  . carvedilol  18.75 mg Oral BID WC  . empagliflozin  10 mg Oral q morning - 10a  . enoxaparin (LOVENOX) injection  0.5 mg/kg Subcutaneous Q24H  . insulin aspart  0-5 Units Subcutaneous QHS  . insulin aspart  0-9 Units Subcutaneous TID WC  . insulin aspart  5 Units Subcutaneous TID WC  . insulin glargine  35 Units Subcutaneous QHS  . ipratropium-albuterol  3 mL Nebulization TID  . losartan  50 mg Oral Daily  . [START ON 02/26/2020] predniSONE  20 mg Oral QAC breakfast  . sodium chloride flush  3 mL Intravenous Q12H  . torsemide  40 mg Oral BID   Continuous Infusions: . sodium chloride  Assessment/Plan:  1. Acute diastolic congestive heart failure.  Cardiology switched twice daily to once daily Lasix IV and likely will transition to oral medications-continue Coreg and losartan.  Cardiology also started [place].  Cough seems about the same without sputum 2. Dyspnea-likely multifactorial-continue DuoNeb, albuterol-adding today budesonide inhaler in addition to low-dose prednisone 20 mg although do not think he has underlying COPD-monitor for effect 3. Acute kidney injury stage IIIa on chronic kidney disease stage II.  Monitor closely with diuresis.  BUN/creatinine 40/1.7 and slightly up 4. Cough.  Continue Tussionex and nebulizer treatments.  Chest x-ray did not show any pneumonia. 5. Acute hypoxic respiratory failure.  Pulse ox 87% on room air on presentation.  Continue to attempt to wean as tolerated desat screen ordered 6. Type 2 diabetes mellitus uncontrolled with hyperlipidemia.  Continue atorvastatin.  Continue Lantus insulin 35 units daily.  Hemoglobin A1c very elevated at 12.0.  Continue short acting insulin prior to meals--sugars reasonable 170s to 200s 7. Morbid obesity with a BMI of 55.0.  Continue to monitor daily weights with diuresis. 8. Neck pain.  Trial of Flexeril-no complaints today and is  more mobile 9. Possible cirrhosis of liver seen on CT scan of the abdomen.  Hepatitis B profile and hepatitis C negative. 10. Large ventral hernia with loops of small bowel seen on CT scan but no obstruction or incarceration seen.  Patient did have prior surgery in [facility] and recommend getting back to his surgeon as outpatient once respiratory status better.  Coughing has made his hernia worse.  Try to suppress cough.      Code Status:     Code Status Orders  (From admission, onward)         Start     Ordered   02/20/20 0128  Full code  Continuous        02/20/20 0130        Code Status History    Date Active Date Inactive Code Status Order ID Comments User Context   07/08/2019 1419 07/09/2019 1637 Full Code [id number]  Lorretta Harp, MD ED   10/21/2017 2254 10/28/2017 1857 Full Code [id number]  Axel Filler, MD ED   09/14/2017 1440 09/17/2017 1941 Full Code [id number]  Lovena Neighbours, MD ED   09/06/2017 1929 09/08/2017 2113 Full Code [id number]  Myrene Buddy, MD ED   07/10/2017 1947 07/18/2017 2142 Full Code [id number]  Bobette Mo, MD ED   Advance Care Planning Activity     Family Communication: No family available at bedside Disposition Plan: Status is: Inpatient  Dispo: The patient is from: Home              Anticipated d/c is to: Home              Anticipated d/c date is: Likely can discharge 11/28              Patient currently diuresing as per cardiology but changing to oral today-might need oxygen  Consultants:  Cardiology  Time spent: 22 minutes  [facility]

## 2020-02-25 NOTE — Plan of Care (Signed)
  Problem: Education: Goal: Knowledge of General Education information will improve Description: Including pain rating scale, medication(s)/side effects and non-pharmacologic comfort measures Outcome: Progressing   Problem: Health Behavior/Discharge Planning: Goal: Ability to manage health-related needs will improve Outcome: Progressing   Problem: Clinical Measurements: Goal: Ability to maintain clinical measurements within normal limits will improve Outcome: Progressing Goal: Will remain free from infection Outcome: Progressing Goal: Diagnostic test results will improve Outcome: Progressing Goal: Respiratory complications will improve Outcome: Progressing Goal: Cardiovascular complication will be avoided Outcome: Progressing   Problem: Activity: Goal: Risk for activity intolerance will decrease Outcome: Progressing   Problem: Nutrition: Goal: Adequate nutrition will be maintained Outcome: Progressing   Problem: Coping: Goal: Level of anxiety will decrease Outcome: Progressing   Problem: Elimination: Goal: Will not experience complications related to bowel motility Outcome: Progressing Goal: Will not experience complications related to urinary retention Outcome: Progressing   Problem: Pain Managment: Goal: General experience of comfort will improve Outcome: Progressing   Problem: Safety: Goal: Ability to remain free from injury will improve Outcome: Progressing   Problem: Skin Integrity: Goal: Risk for impaired skin integrity will decrease Outcome: Progressing   Problem: Education: Goal: Ability to demonstrate management of disease process will improve Outcome: Progressing Goal: Ability to verbalize understanding of medication therapies will improve Outcome: Progressing Goal: Individualized Educational Video(s) Outcome: Progressing   Problem: Activity: Goal: Capacity to carry out activities will improve Outcome: Progressing   Problem: Cardiac: Goal:  Ability to achieve and maintain adequate cardiopulmonary perfusion will improve Outcome: Progressing   Problem: Education: Goal: Ability to describe self-care measures that may prevent or decrease complications (Diabetes Survival Skills Education) will improve Outcome: Progressing Goal: Individualized Educational Video(s) Outcome: Progressing   Problem: Coping: Goal: Ability to adjust to condition or change in health will improve Outcome: Progressing   Problem: Fluid Volume: Goal: Ability to maintain a balanced intake and output will improve Outcome: Progressing   Problem: Health Behavior/Discharge Planning: Goal: Ability to identify and utilize available resources and services will improve Outcome: Progressing   Problem: Metabolic: Goal: Ability to maintain appropriate glucose levels will improve Outcome: Progressing   Problem: Nutritional: Goal: Maintenance of adequate nutrition will improve Outcome: Progressing Goal: Progress toward achieving an optimal weight will improve Outcome: Progressing   Problem: Skin Integrity: Goal: Risk for impaired skin integrity will decrease Outcome: Progressing   Problem: Tissue Perfusion: Goal: Adequacy of tissue perfusion will improve Outcome: Progressing   PRN tussionex given for cough. Pt remains on 2L O2 and scheduled breathing treatments. No new changes overnight. Will continue with the current plan of care.

## 2020-02-26 LAB — CBC WITH DIFFERENTIAL/PLATELET
Abs Immature Granulocytes: 0.04 10*3/uL (ref 0.00–0.07)
Basophils Absolute: 0.1 10*3/uL (ref 0.0–0.1)
Basophils Relative: 1 %
Eosinophils Absolute: 0.6 10*3/uL — ABNORMAL HIGH (ref 0.0–0.5)
Eosinophils Relative: 6 %
HCT: 45.9 % (ref 39.0–52.0)
Hemoglobin: 14.3 g/dL (ref 13.0–17.0)
Immature Granulocytes: 0 %
Lymphocytes Relative: 23 %
Lymphs Abs: 2.4 10*3/uL (ref 0.7–4.0)
MCH: 27.8 pg (ref 26.0–34.0)
MCHC: 31.2 g/dL (ref 30.0–36.0)
MCV: 89.1 fL (ref 80.0–100.0)
Monocytes Absolute: 0.9 10*3/uL (ref 0.1–1.0)
Monocytes Relative: 8 %
Neutro Abs: 6.3 10*3/uL (ref 1.7–7.7)
Neutrophils Relative %: 62 %
Platelets: 259 10*3/uL (ref 150–400)
RBC: 5.15 MIL/uL (ref 4.22–5.81)
RDW: 13.2 % (ref 11.5–15.5)
WBC: 10.3 10*3/uL (ref 4.0–10.5)
nRBC: 0 % (ref 0.0–0.2)

## 2020-02-26 LAB — GLUCOSE, CAPILLARY
Glucose-Capillary: 159 mg/dL — ABNORMAL HIGH (ref 70–99)
Glucose-Capillary: 263 mg/dL — ABNORMAL HIGH (ref 70–99)

## 2020-02-26 LAB — COMPREHENSIVE METABOLIC PANEL
ALT: 17 U/L (ref 0–44)
AST: 19 U/L (ref 15–41)
Albumin: 3.3 g/dL — ABNORMAL LOW (ref 3.5–5.0)
Alkaline Phosphatase: 72 U/L (ref 38–126)
Anion gap: 9 (ref 5–15)
BUN: 40 mg/dL — ABNORMAL HIGH (ref 6–20)
CO2: 34 mmol/L — ABNORMAL HIGH (ref 22–32)
Calcium: 8.5 mg/dL — ABNORMAL LOW (ref 8.9–10.3)
Chloride: 94 mmol/L — ABNORMAL LOW (ref 98–111)
Creatinine, Ser: 1.68 mg/dL — ABNORMAL HIGH (ref 0.61–1.24)
GFR, Estimated: 49 mL/min — ABNORMAL LOW (ref >60)
Glucose, Bld: 186 mg/dL — ABNORMAL HIGH (ref 70–99)
Potassium: 4.2 mmol/L (ref 3.5–5.1)
Sodium: 137 mmol/L (ref 135–145)
Total Bilirubin: 1.2 mg/dL (ref 0.3–1.2)
Total Protein: 7.3 g/dL (ref 6.5–8.1)

## 2020-02-26 MED ORDER — BENZONATATE 200 MG PO CAPS
200.0000 mg | ORAL_CAPSULE | Freq: Three times a day (TID) | ORAL | 0 refills | Status: DC | PRN
Start: 2020-02-26 — End: 2020-03-09

## 2020-02-26 MED ORDER — PREDNISONE 20 MG PO TABS
20.0000 mg | ORAL_TABLET | Freq: Every day | ORAL | 0 refills | Status: DC
Start: 2020-02-27 — End: 2020-03-09

## 2020-02-26 MED ORDER — BUDESONIDE 0.5 MG/2ML IN SUSP: 1.0000 mg | Freq: Two times a day (BID) | RESPIRATORY_TRACT | 11 refills | Status: DC

## 2020-02-26 MED ORDER — HYDROCOD POLST-CPM POLST ER 10-8 MG/5ML PO SUER: 5.0000 mL | Freq: Two times a day (BID) | ORAL | 0 refills | Status: DC | PRN

## 2020-02-26 MED ORDER — IPRATROPIUM-ALBUTEROL 0.5-2.5 (3) MG/3ML IN SOLN
3.0000 mL | Freq: Three times a day (TID) | RESPIRATORY_TRACT | 0 refills | Status: DC
Start: 2020-02-26 — End: 2020-03-09

## 2020-02-26 MED ORDER — EMPAGLIFLOZIN 10 MG PO TABS: 10.0000 mg | ORAL_TABLET | Freq: Every day | ORAL | 1 refills | Status: DC

## 2020-02-26 MED ORDER — LOSARTAN POTASSIUM 50 MG PO TABS: 50.0000 mg | ORAL_TABLET | Freq: Every day | ORAL | 3 refills | Status: DC

## 2020-02-26 NOTE — Discharge Summary (Signed)
Physician Discharge Summary  Jeremiah Vance:[id number] DOB: 11-20-1970 DOA: 02/19/2020  PCP: [name], NP  Admit date: 02/19/2020 Discharge date: 02/26/2020  Time spent: 27 minutes  Recommendations for Outpatient Follow-up:  1. Patient will need close follow-up in the outpatient setting and probable initiation of insulin on follow-up-he was discharged home because of sudden death in the family so we did not have a chance to coordinate his multiple things 2. 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 He needs outpatient pulmonology referral for spirometry and split-night sleep apnea study for you can consider stop bang test in the outpatient setting  Discharge Diagnoses:  Active Problems:   Acute on chronic heart failure with preserved ejection fraction (HFpEF) (HCC)   Type 2 diabetes mellitus with hyperlipidemia (HCC)   Acute kidney injury superimposed on CKD (HCC)   Acute CHF (congestive heart failure) (HCC)   Shortness of breath   Hypertensive urgency   Acute respiratory failure with hypoxia (HCC)   Neck pain   Other cirrhosis of liver ([place])   Ventral hernia without obstruction or gangrene   Cough   Discharge Condition: Fair  Diet recommendation: Diabetic  Filed Weights   02/24/20 0410 02/25/20 0120 02/26/20 0217  Weight: (!) 175 kg (!) 174.8 kg (!) 172.6 kg    History of present illness:  49 year old BMI 54 HFpEF CKD 3 DM TY 2 admitted on 02/20/2020 with shortness of breath and desaturation He was found on admission to have pulmonary vascular congestion and felt to have HFpEF Cardiology was consulted  Hospital Course:  1. Acute diastolic congestive heart failure.  Cardiology switched twice daily to once daily Lasix IV transitioned to torsemide 40 daily on discharge -continue Coreg and losartan.  Cardiology also started [place].  2. Dyspnea-likely  multifactorial-continue DuoNeb, albuterol-adding today budesonide inhaler in addition to low-dose prednisone 20 mg as he might have COPD secondary to exposure to smoke at home 3. Acute kidney injury stage IIIa on chronic kidney disease stage II.  Monitor closely with diuresis.  BUN/creatinine  40/1.6 above baseline of creatinine 1.2-1.4 4. Cough.  Continue Tussionex and nebulizer treatments.  Chest x-ray did not show any pneumonia.-See above  5. Acute hypoxic respiratory failure.  Pulse ox 87% on room air on presentation.    Patient will need discharge on oxygen 3 L 6. Type 2 diabetes mellitus uncontrolled with hyperlipidemia.  Continue atorvastatin. Hemoglobin A1c very elevated at 12.0.    Poor control-has never used insulin so this needs to be coordinated as an outpatient given the suddenness of his need for discharge-patient has been placed on discharge Jardiance and Trulicity and although we are aware that he has some CKD this is probably the best that we will be able to prescribe for him at this time and he will need close follow-up in the outpatient setting for transition 7. Morbid obesity with a BMI of 55.0.  Continue to monitor daily weights with diuresis. 8. Neck pain.  Trial of Flexeril-no complaints today and is more mobile 9. Possible cirrhosis of liver seen on CT scan of the abdomen.  Hepatitis B profile and hepatitis C negative. 10. Large ventral hernia with loops of small bowel seen on CT scan but no obstruction or incarceration seen.  Patient did have prior surgery in [facility] and recommend getting back to his surgeon as outpatient once respiratory status better.  Coughing has  made his hernia worse.     Procedures: Echocardiogram Consultations:  Cardiologist  Discharge Exam: Vitals:   02/26/20 0432 02/26/20 0741  BP: 140/66 118/83  Pulse: (!) 52 (!) 53  Resp:  18  Temp: 98.1 F (36.7 C) 97.8 F (36.6 C)  SpO2: 94% 95%    General: Awake coherent no distress thick neck  breathing relatively comfortably-on monitors Cardiovascular: S1-S2 no murmur no rub no gallop RRR seems to have bradycardia in 50s Respiratory: No wheeze no rales no rhonchi Lower extremities have chronic venous stasis with trace edema  Discharge Instructions   Discharge Instructions    Diet - low sodium heart healthy   Complete by: As directed    Discharge instructions   Complete by: As directed    This hospital admission you were diagnosed with shortness of breath-we feel that the shortness of breath is probably because of both heart failure and may be some underlying COPD as a result some medications that you have been taking in the past have changed so please look at your list of medications carefully You will probably need low-dose oxygen 2 to 3 L going forward until he can be reevaluated and I would recommend that you get sleep apnea testing in the outpatient setting coordinated  you will need prednisone for several days, nebulizations with a nebulizer in addition to also budesonide which is a controller medication that needs to be taken regularly   -because your blood sugars were so high it is felt that you will probably eventually need discussion about insulin in the outpatient setting and you should follow-up in about 1 week to discuss this-given your father's death however and the emergent nature of you needing to be discharged we are placing you back on Jardiance in addition to another blood sugar medication Trulicity that will help control your blood sugars until that time   Increase activity slowly   Complete by: As directed      Allergies as of 02/26/2020      Reactions   Pollen Extract Other (See Comments)      Medication List    STOP taking these medications   Ozempic (0.25 or 0.5 MG/DOSE) 2 MG/1.5ML Sopn Generic drug: Semaglutide(0.25 or 0.5MG/DOS)     TAKE these medications   aspirin 81 MG chewable tablet Chew 1 tablet (81 mg total) by mouth daily.    atorvastatin 40 MG tablet Commonly known as: LIPITOR Take 1 tablet (40 mg total) by mouth daily at 6 PM.   benzonatate 200 MG capsule Commonly known as: TESSALON Take 1 capsule (200 mg total) by mouth 3 (three) times daily as needed for cough.   blood glucose meter kit and supplies Dispense based on patient and insurance preference. Use up to four times daily as directed. (FOR ICD-10 E10.9, E11.9).   budesonide 0.5 MG/2ML nebulizer solution Commonly known as: PULMICORT Take 4 mLs (1 mg total) by nebulization 2 (two) times daily.   carvedilol 6.25 MG tablet Commonly known as: COREG Take 3 tablets (18.75 mg total) by mouth 2 (two) times daily with a meal.   chlorpheniramine-HYDROcodone 10-8 MG/5ML Suer Commonly known as: TUSSIONEX Take 5 mLs by mouth every 12 (twelve) hours as needed for cough.   cyclobenzaprine 5 MG tablet Commonly known as: FLEXERIL Take 1 tablet (5 mg total) by mouth 3 (three) times daily as needed for muscle spasms.   empagliflozin 10 MG Tabs tablet Commonly known as: JARDIANCE Take 1 tablet (10 mg total) by mouth daily  before breakfast. What changed: when to take this   ipratropium-albuterol 0.5-2.5 (3) MG/3ML Soln Commonly known as: DUONEB Take 3 mLs by nebulization 3 (three) times daily.   losartan 50 MG tablet Commonly known as: COZAAR Take 1 tablet (50 mg total) by mouth daily. What changed:   medication strength  how much to take   predniSONE 20 MG tablet Commonly known as: DELTASONE Take 1 tablet (20 mg total) by mouth daily before breakfast. Start taking on: February 27, 2020   torsemide 20 MG tablet Commonly known as: DEMADEX Take 2 tablets (40 mg total) by mouth daily.   Trulicity 1.5 OI/7.8MV Sopn Generic drug: Dulaglutide Inject 1.5 mg into the skin once a week.            Durable Medical Equipment  (From admission, onward)         Start     Ordered   02/26/20 0927  DME Oxygen  Once       Question Answer Comment   Length of Need 6 Months   Mode or (Route) Nasal cannula   Liters per Minute 3   Frequency Continuous (stationary and portable oxygen unit needed)   Oxygen delivery system Gas      02/26/20 0930         Allergies  Allergen Reactions  . Pollen Extract Other (See Comments)    Follow-up Information    [facility] Follow up on 03/06/2020.   Specialty: Cardiology Why: at 9:30am. Enter through the [place] entrance Contact information: [place] [address] [phone]       Kate Sable, MD Follow up in 1 week(s).   Specialties: Cardiology, Radiology Why: we will arrange for follow-up and contact you. Contact information: [place] [address] [phone]                The results of significant diagnostics from this hospitalization (including imaging, microbiology, ancillary and laboratory) are listed below for reference.    Significant Diagnostic Studies: CT ABDOMEN PELVIS WO CONTRAST  Result Date: 02/20/2020 CLINICAL DATA:  New onset right lower quadrant pain EXAM: CT ABDOMEN AND PELVIS WITHOUT CONTRAST TECHNIQUE: Multidetector CT imaging of the abdomen and pelvis was performed following the standard protocol without IV contrast. COMPARISON:  October 21, 2017 FINDINGS: Lower chest: There is a stable 5 mm pulmonary nodule in the right lower lobe. There is a mild amount of atelectasis at the lung bases.The heart is enlarged. Hepatobiliary: The liver surface appears nodular, although it is poorly evaluated secondary to respiratory motion artifact. Cholelithiasis without acute inflammation.There is no biliary ductal dilation. Pancreas: Normal contours without ductal dilatation. No peripancreatic fluid collection. Spleen: The spleen is enlarged measuring 14 cm craniocaudad. 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 --Right kidney/ureter: No hydronephrosis or radiopaque kidney stones. --Left kidney/ureter: No hydronephrosis or radiopaque kidney stones. --Urinary bladder: Unremarkable. Stomach/Bowel: --Stomach/Duodenum: No hiatal hernia or other gastric abnormality. Normal duodenal course and caliber. --Small bowel: Unremarkable. --Colon: Unremarkable. --Appendix: Normal. Vascular/Lymphatic: Normal course and caliber of the major abdominal vessels. --No retroperitoneal lymphadenopathy. --No mesenteric lymphadenopathy. --No pelvic or inguinal lymphadenopathy. Reproductive: Unremarkable Other: No ascites or free air. There is a large low midline abdominal wall hernia. This hernia contains loops of small bowel and a small amount of free fluid without evidence for an obstruction. Musculoskeletal. No acute displaced fractures. IMPRESSION: 1. No acute abdominopelvic  abnormality. 2. Large low midline abdominal wall hernia containing loops of small bowel and a small amount of free fluid without evidence for an obstruction. 3. Cholelithiasis without acute inflammation. 4. Nodular liver surface with splenomegaly, suggestive of cirrhosis. Enlarged spleen suggestive of portal hypertension. 5. Cardiomegaly. Aortic Atherosclerosis (ICD10-I70.0). Electronically Signed   By: Constance Holster M.D.   On: 02/20/2020 23:55   DG Chest 2 View  Result Date: 02/22/2020 CLINICAL DATA:  49 year old male with a history of shortness of breath EXAM: CHEST - 2 VIEW COMPARISON:  02/19/2020 FINDINGS: Cardiomediastinal silhouette unchanged with cardiomegaly. No pneumothorax or pleural effusion. Improved aeration compared to the prior. No interlobular septal thickening. No new confluent airspace disease IMPRESSION: Low lung volumes with likely atelectasis and no evidence of acute cardiopulmonary disease Electronically Signed   By: Corrie Mckusick D.O.   On: 02/22/2020 11:56   DG Chest 2 View  Result Date: 02/19/2020 CLINICAL DATA:   Shortness of breath. EXAM: CHEST - 2 VIEW COMPARISON:  October 24, 2019 FINDINGS: The mild prominence of the pulmonary vasculature is seen. This is slightly more pronounced within the perihilar regions. There is no evidence of acute infiltrate, pleural effusion or pneumothorax. The cardiac silhouette is mildly enlarged. The visualized skeletal structures are unremarkable. IMPRESSION: Mild pulmonary vascular congestion. Electronically Signed   By: Virgina Norfolk M.D.   On: 02/19/2020 22:36    Microbiology: Recent Results (from the past 240 hour(s))  Resp Panel by RT-PCR (Flu A&B, Covid) Nasopharyngeal Swab     Status: None   Collection Time: 02/19/20 11:28 PM   Specimen: Nasopharyngeal Swab; Nasopharyngeal(NP) swabs in vial transport medium  Result Value Ref Range Status   SARS Coronavirus 2 by RT PCR NEGATIVE NEGATIVE Final    Comment: (NOTE) SARS-CoV-2 target nucleic acids are NOT DETECTED.  The SARS-CoV-2 RNA is generally detectable in upper respiratory specimens during the acute phase of infection. The lowest concentration of SARS-CoV-2 viral copies this assay can detect is 138 copies/mL. A negative result does not preclude SARS-Cov-2 infection and should not be used as the sole basis for treatment or other patient management decisions. A negative result may occur with  improper specimen collection/handling, submission of specimen other than nasopharyngeal swab, presence of viral mutation(s) within the areas targeted by this assay, and inadequate number of viral copies(<138 copies/mL). A negative result must be combined with clinical observations, patient history, and epidemiological information. The expected result is Negative.  Fact Sheet for Patients:  EntrepreneurPulse.com.au  Fact Sheet for Healthcare Providers:  IncredibleEmployment.be  This test is no t yet approved or cleared by the [place] FDA and  has been authorized for detection  and/or diagnosis of SARS-CoV-2 by FDA under an Emergency Use Authorization (EUA). This EUA will remain  in effect (meaning this test can be used) for the duration of the COVID-19 declaration under Section 564(b)(1) of the Act, 21 U.S.C.section 360bbb-3(b)(1), unless the authorization is terminated  or revoked sooner.       Influenza A by PCR NEGATIVE NEGATIVE Final   Influenza B by PCR NEGATIVE NEGATIVE Final    Comment: (NOTE) The Xpert Xpress SARS-CoV-2/FLU/RSV plus assay is intended as an aid in the diagnosis of influenza from Nasopharyngeal swab specimens and should not be used as a sole basis for treatment. Nasal washings and aspirates are unacceptable for Xpert Xpress SARS-CoV-2/FLU/RSV testing.  Fact Sheet for Patients: EntrepreneurPulse.com.au  Fact Sheet for Healthcare Providers: IncredibleEmployment.be  This test is not yet approved or cleared by the  [place] Architectural technologist and has been authorized for detection and/or diagnosis of SARS-CoV-2 by FDA under an Print production planner (EUA). This EUA will remain in effect (meaning this test can be used) for the duration of the COVID-19 declaration under Section 564(b)(1) of the Act, 21 U.S.C. section 360bbb-3(b)(1), unless the authorization is terminated or revoked.  Performed at [facility], [address] [place], [place] [address]      Labs: Basic Metabolic Panel: Recent Labs  Lab 02/20/20 0028 02/20/20 2778 02/22/20 2423 02/23/20 0429 02/24/20 0715 02/25/20 0541 02/26/20 0624  NA  --    < > 136 139 134* 137 137  K  --    < > 3.9 3.8 4.0 4.0 4.2  CL  --    < > 95* 95* 93* 94* 94*  CO2  --    < > 30 33* 31 32 34*  GLUCOSE  --    < > 211* 145* 198* 176* 186*  BUN  --    < > 30* 35* 39* 41* 40*  CREATININE  --    < > 1.79* 1.89* 1.77* 1.70* 1.68*  CALCIUM  --    < > 8.0* 8.2* 8.0* 8.1* 8.5*  MG 2.0  --   --   --   --   --   --    < > = values in this interval  not displayed.   Liver Function Tests: Recent Labs  Lab 02/19/20 2214 02/25/20 0541 02/26/20 0624  AST '21 16 19  ' ALT '22 16 17  ' ALKPHOS 89 65 72  BILITOT 0.8 0.9 1.2  PROT 7.3 6.9 7.3  ALBUMIN 3.4* 3.1* 3.3*   No results for input(s): LIPASE, AMYLASE in the last 168 hours. No results for input(s): AMMONIA in the last 168 hours. CBC: Recent Labs  Lab 02/20/20 0514 02/23/20 0429 02/24/20 0715 02/25/20 0541 02/26/20 0624  WBC 9.2 11.0* 9.8 10.4 10.3  NEUTROABS  --   --  6.2 6.4 6.3  HGB 14.0 14.1 13.8 13.6 14.3  HCT 44.1 45.0 44.2 42.6 45.9  MCV 86.8 89.1 88.9 87.8 89.1  PLT 244 258 234 252 259   Cardiac Enzymes: No results for input(s): CKTOTAL, CKMB, CKMBINDEX, TROPONINI in the last 168 hours. BNP: BNP (last 3 results) Recent Labs    07/08/19 0938 02/19/20 2214  BNP 219.0* 267.9*    ProBNP (last 3 results) No results for input(s): PROBNP in the last 8760 hours.  CBG: Recent Labs  Lab 02/25/20 0812 02/25/20 1136 02/25/20 1639 02/25/20 2209 02/26/20 0743  GLUCAP 152* 205* 135* 101* 159*       Signed:  [name] MD   [facility] 02/26/2020, 9:30 AM  27

## 2020-02-26 NOTE — TOC Transition Note (Addendum)
Transition of Care [facility]) - CM/SW Discharge Note   Patient Details  Name: Jeremiah Vance MRN: [id number] Date of Birth: 01/27/1971  [facility] CM/SW Contact:  Jeremiah Quarry, RN Phone Number: 02/26/2020, 9:54 AM   Clinical Narrative:     11/27 Follow up with Unit RN just now (1250). Stated patient received transport oxygen and that [employer] was on the way to patient's home as he was being discharged. Jeremiah Cirri RN CM  [id number] 11/27 Discharge orders noted to home/Self care with home oxygen 3L/[facility]. [facility] contacted and accepted by Jeremiah Vance [phone]. Will need portable oxygen and home oxygen. Discharge due to death of father and patient wish to be able to be with family.See prior notes on med mgmt./pharmacy needs on discharge.  Medications from med mgmt. left with Brittney RN by RN CM 11/26 along with Match letter included for discharge meds not covered by med. Management. Will contact RN for any follow up needed but CM will sign off pending discharge.  Jeremiah Cirri RN CM           Patient Goals and CMS Choice        Discharge Placement                       Discharge Plan and Services                                     Social Determinants of Health (SDOH) Interventions     Readmission Risk Interventions No flowsheet data found.

## 2020-02-26 NOTE — Progress Notes (Signed)
Patient upset tonight because he learned that his father was in the hospital and that he ended up passing away. No new changes medically overnight. Patient does not always use urinal, and is irritable with staff that we don't leave him alone. Will request tussionex Q12 hours for cough. Patient has meds in refrigerator and access bin that need to go home with him at discharge.

## 2020-02-26 NOTE — Progress Notes (Signed)
Progress Note  Patient Name: Jeremiah Vance Date of Encounter: 02/26/2020  Primary Cardiologist: Debbe Odea, MD  Subjective   Overall, breathing improved since admission, though still using O2 while in bed (was not on @ home).  He learned of his father's passing last night and [place].  He notes good response to oral torsemide yesterday, though UO not saved/inaccurate.    Inpatient Medications    Scheduled Meds:  aspirin  81 mg Oral Daily   atorvastatin  40 mg Oral q1800   budesonide  1 mg Nebulization BID   carvedilol  18.75 mg Oral BID WC   empagliflozin  10 mg Oral q morning - 10a   enoxaparin (LOVENOX) injection  0.5 mg/kg Subcutaneous Q24H   insulin aspart  0-5 Units Subcutaneous QHS   insulin aspart  0-9 Units Subcutaneous TID WC   insulin aspart  5 Units Subcutaneous TID WC   insulin glargine  35 Units Subcutaneous QHS   ipratropium-albuterol  3 mL Nebulization TID   losartan  50 mg Oral Daily   predniSONE  20 mg Oral QAC breakfast   sodium chloride flush  3 mL Intravenous Q12H   torsemide  40 mg Oral BID   Continuous Infusions:  sodium chloride     PRN Meds: sodium chloride, acetaminophen **OR** acetaminophen, albuterol, benzonatate, chlorpheniramine-HYDROcodone, cyclobenzaprine, labetalol, magnesium hydroxide, ondansetron **OR** ondansetron (ZOFRAN) IV, sodium chloride flush, traZODone   Vital Signs    Vitals:   02/25/20 2110 02/26/20 0217 02/26/20 0432 02/26/20 0741  BP:   140/66 118/83  Pulse:   (!) 52 (!) 53  Resp:    18  Temp:   98.1 F (36.7 C) 97.8 F (36.6 C)  TempSrc:   Oral Oral  SpO2: 100%  94% 95%  Weight:  (!) 172.6 kg    Height:        Intake/Output Summary (Last 24 hours) at 02/26/2020 0835 Last data filed at 02/26/2020 0500 Gross per 24 hour  Intake 240 ml  Output 0 ml  Net 240 ml   Filed Weights   02/24/20 0410 02/25/20 0120 02/26/20 0217  Weight:  (!) 175 kg (!) 174.8 kg (!) 172.6 kg    Physical Exam   GEN: Obese, in no acute distress.  HEENT: Grossly normal.  Neck: Supple, obese, difficult to gauge JVP.  No carotid bruits, or masses. Cardiac: RRR, no murmurs, rubs, or gallops. No clubbing, cyanosis.  1+ RLE and 2+ LLE edema.  Radials 2+, DP/PT 1+ and equal bilaterally.  Respiratory:  Respirations regular and unlabored, scattered rhonchi - clear w/ cough. Diminished @ bases. GI: Obese, soft, diffusely tender, nondistended, BS + x 4. MS: no deformity or atrophy. Skin: warm and dry, no rash. Neuro:  Strength and sensation are intact. Psych: AAOx3.  Normal affect.  Labs    Chemistry Recent Labs  Lab 02/19/20 2214 02/20/20 0514 02/24/20 0715 02/25/20 0541 02/26/20 0624  NA 135   < > 134* 137 137  K 4.4   < > 4.0 4.0 4.2  CL 98   < > 93* 94* 94*  CO2 26   < > 31 32 34*  GLUCOSE 385*   < > 198* 176* 186*  BUN 17   < > 39* 41* 40*  CREATININE 1.41*   < > 1.77* 1.70* 1.68*  CALCIUM 8.5*   < > 8.0* 8.1* 8.5*  PROT 7.3  --   --  6.9 7.3  ALBUMIN 3.4*  --   --  3.1* 3.3*  AST 21  --   --  16 19  ALT 22  --   --  16 17  ALKPHOS 89  --   --  65 72  BILITOT 0.8  --   --  0.9 1.2  GFRNONAA >60   < > 46* 49* 49*  ANIONGAP 11   < > 10 11 9    < > = values in this interval not displayed.     Hematology Recent Labs  Lab 02/24/20 0715 02/25/20 0541 02/26/20 0624  WBC 9.8 10.4 10.3  RBC 4.97 4.85 5.15  HGB 13.8 13.6 14.3  HCT 44.2 42.6 45.9  MCV 88.9 87.8 89.1  MCH 27.8 28.0 27.8  MCHC 31.2 31.9 31.2  RDW 13.5 13.3 13.2  PLT 234 252 259    Cardiac Enzymes  Recent Labs  Lab 02/19/20 2214 02/20/20 0028  TROPONINIHS 37* 38*      BNP Recent Labs  Lab 02/19/20 2214  BNP 267.9*     Lipids  Lab Results  Component Value Date   CHOL 138 01/18/2020   HDL 35.80 (L) 01/18/2020   LDLCALC 85 01/18/2020   TRIG 88.0 01/18/2020   CHOLHDL 4 01/18/2020    HbA1c  Lab Results  Component Value Date   HGBA1C 12.0  (H) 02/20/2020    Radiology    DG Chest 2 View  Result Date: 02/22/2020 CLINICAL DATA:  49 year old male with a history of shortness of breath EXAM: CHEST - 2 VIEW COMPARISON:  02/19/2020 FINDINGS: Cardiomediastinal silhouette unchanged with cardiomegaly. No pneumothorax or pleural effusion. Improved aeration compared to the prior. No interlobular septal thickening. No new confluent airspace disease IMPRESSION: Low lung volumes with likely atelectasis and no evidence of acute cardiopulmonary disease Electronically Signed   By: 02/21/2020 D.O.   On: 02/22/2020 11:56    Telemetry    RSR/sinus brady - Personally Reviewed  Cardiac Studies   2D echo 07/2019: 1. Left ventricular ejection fraction, by estimation, is 50 to 55%. The  left ventricle has low normal function. The left ventricle demonstrates  global hypokinesis. There is mild left ventricular hypertrophy. Left  ventricular diastolic parameters are  consistent with Grade II diastolic dysfunction (pseudonormalization).  2. Right ventricular systolic function is normal. The right ventricular  size is mildly enlarged. Tricuspid regurgitation signal is inadequate for  assessing PA pressure.  3. Left atrial size was mildly dilated.  Patient Profile     49 y.o. male with history of HFpEF, CVA 08/2017, CKD 3, poorly controlled DM2, hypertension, OSA nonadherent to CPAP, and admitted with hypertensive urgency (191/88) and acute on chronic HFpEF.  Assessment & Plan    1.  Hypertensive Urgency:  BPs relatively stable on  blocker, ARB, torsemide.  2.  Acute on chronic HF w/ preserved EF:  EF 50-55% by echo 07/2019.  Admission wt ~ 12 kg above wts from earlier this year.  Switched to torsemide 11/26 in setting of rising creat.  Since then, outputs have not been recorded/pt not saving, but notes good response to torsemide.  Wt down 2.2 kg since yesterday and 6.6 kg since admission. Still ~ 5 kg above prior dry wt. Still w/ ankle edema,  which he says is chronic. Exam challenging but abd now soft, no flank edema.  Lungs most notably w/ scatt rhonchi.  Cont current doses of  blocker, ARB, torsemide.  With EF < 57%, would likely benefit  from transition to [facility], though likely cost prohibitive.  With the death of his father last night, he'd like to be with his family.  As he is no longer on IV lasix, he may be able to be discharged w/ early outpt f/u.  Ambulate, wean from O2 if able.  3.  Poorly controlled DMII:  A1c 12.  Noncompliant @ home.  Insulin mgmt per IM.  Jardiance added in setting of #2.  4.  CKD III:  Creat sl better today @ 1.66 after changing to oral torsemide.  5.  OSA: noncompliant w/ CPAP previously.  Signed, Nicolasa Ducking, NP  02/26/2020, 8:35 AM    For questions or updates, please contact   Please consult [email or web address] for contact info under Cardiology/STEMI.

## 2020-02-26 NOTE — Discharge Instructions (Signed)

## 2020-02-28 ENCOUNTER — Ambulatory Visit: Payer: Self-pay | Admitting: Cardiology

## 2020-02-28 ENCOUNTER — Ambulatory Visit: Payer: Medicaid Other | Admitting: Pharmacist

## 2020-02-28 ENCOUNTER — Telehealth: Payer: Self-pay | Admitting: Cardiology

## 2020-02-28 DIAGNOSIS — E1165 Type 2 diabetes mellitus with hyperglycemia: Secondary | ICD-10-CM

## 2020-02-28 DIAGNOSIS — I5030 Unspecified diastolic (congestive) heart failure: Secondary | ICD-10-CM

## 2020-02-28 DIAGNOSIS — N183 Chronic kidney disease, stage 3 unspecified: Secondary | ICD-10-CM

## 2020-02-28 NOTE — Telephone Encounter (Signed)
Attempted to schedule.  LMOV to call office.  ° °

## 2020-02-28 NOTE — Progress Notes (Signed)
Catie to reach out 02/29/2020

## 2020-02-29 NOTE — Chronic Care Management (AMB) (Signed)
  Chronic Care Management   Note  02/29/2020 Name: Jeremiah Vance MRN: [id number] DOB: Dec 06, 1970   Attempted to contact patient regarding discharge over the weekend. Placed referral for Managed Medicaid care management support. Patient requested we wait until he was back in town (out of town for a funeral) prior to medication review.   Plan: - Will attempt outreach later this week  Catie Feliz Beam, PharmD, [place], CPP Clinical Pharmacist [facility] [employer] [phone]

## 2020-03-03 ENCOUNTER — Other Ambulatory Visit: Payer: Self-pay

## 2020-03-03 NOTE — Patient Instructions (Signed)
Visit Information  [name]  - as a part of your Medicaid benefit, you are eligible for care management and care coordination services at no cost or copay. I was unable to reach you by phone today but would be happy to help you with your health related needs. Please feel free to call me @ [phone].   A member of the Managed Medicaid care management team will reach out to you again over the next 7 days.   Gus Puma, BSW, [facility]  [place]  High Risk Managed Medicaid Team

## 2020-03-03 NOTE — Patient Outreach (Signed)
Care Coordination  03/03/2020  [name] 1970-07-21 [id number]   Medicaid Managed Care   Unsuccessful Outreach Note  03/03/2020 Name: Jeremiah Vance MRN: [id number] DOB: April 15, 1970  Referred by: Theadore Nan, NP Reason for referral : High Risk Managed Medicaid (HR MM Unsuccessful Telephone Outreach)   An unsuccessful telephone outreach was attempted today. The patient was referred to the case management team for assistance with care management and care coordination.   Follow Up Plan: The Managed Medicaid care management team will reach out to the patient again over the next 7 days.   Gus Puma, BSW, [facility]  [place]  High Risk Managed Medicaid Team

## 2020-03-03 NOTE — Telephone Encounter (Signed)
Attempted to call to schedule, mailbox full.

## 2020-03-04 NOTE — Progress Notes (Deleted)
Patient ID: Jeremiah Vance, male    DOB: 09/04/1970, 49 y.o.   MRN: [id number]  HPI  Jeremiah Vance is a 49 y/o male with a history of DM, hyperlipidemia, HTN, CKD, stroke, morbid obesity and chronic heart failure.   Echo report from 07/09/19 reviewed and showed an EF of 50-55% along with mild LVH.  Admitted 02/19/20 due to acute on chronic HF. Cardiology consult obtained. Initially given IV lasix with transition to oral diuretics. Nebulizer, inhalers and steroids started. CXR negative for pneumonia. Needed oxygen at 3L to maintain sats. Uncontrolled diabetes but due to sudden death in patient's family, he was needing discharged. Discharged after 7 days.   He presents today for a follow-up visit with a chief complaint of   Past Medical History:  Diagnosis Date  . Acute ischemic stroke (HCC) 09/06/2017  . Acute renal failure superimposed on stage 3a chronic kidney disease (HCC) 07/08/2019  . Acute right-sided weakness   . Allergies   . Anasarca   . Anemia 07/10/2017  . Arthritis   . CHF (congestive heart failure) (HCC)    "coinsided w/kidney problems I was having 06/2017"  . Chicken pox   . CKD (chronic kidney disease) stage 3, GFR 30-59 ml/min (HCC) 06/2017  . Depression   . Elevated troponin 07/08/2019  . High cholesterol   . History of cardiomyopathy    LVEF 40 to 45% in April 2019 - subsequently normalized  . Hyperbilirubinemia 07/10/2017  . Hypertension   . Ischemic stroke (HCC)    Small left internal capsule infarct due to lacunar disease  . Morbid obesity (HCC)   . Normocytic anemia 12/16/2017  . 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 Stroke (HCC) 08/2017   "right sided weakness since; getting stronger though" (10/22/2017)  . Type 2 diabetes mellitus (HCC)    Past Surgical History:  Procedure Laterality Date  . ABDOMINAL HERNIA REPAIR  2008; 10/22/2017   "scope; OPEN REPAIR INCARCERATED VENTRAL HERNIA  . HERNIA REPAIR    . KNEE ARTHROSCOPY  Right 1989  . VENTRAL HERNIA REPAIR N/A 10/22/2017   Procedure: OPEN REPAIR INCARCERATED VENTRAL HERNIA;  Surgeon: Glenna Fellows, MD;  Location: [facility] OR;  Service: General;  Laterality: N/A;   Family History  Problem Relation Age of Onset  . Diabetes Father   . Heart disease Father   . Arthritis Father   . Hearing loss Father   . Hyperlipidemia Father   . Heart attack Father   . Hypertension Father   . Stroke Father   . Diabetes Sister   . Diabetes Mother   . Stroke Mother   . Arthritis Mother   . Depression Mother   . Heart disease Mother   . Hypertension Mother   . Learning disabilities Mother   . Mental illness Mother   . Diabetes Maternal Grandmother   . Heart disease Maternal Grandmother   . Depression Maternal Grandmother   . Hyperlipidemia Maternal Grandmother   . Hypertension Maternal Grandmother   . Stroke Maternal Grandmother   . Stroke Paternal Grandfather   . Heart disease Paternal Grandfather   . Arthritis Paternal Grandfather   . Heart attack Paternal Grandfather   . Hypertension Maternal Grandfather   . Hyperlipidemia Maternal Grandfather   . Stroke Maternal Grandfather   . Arthritis Paternal Grandmother   . Hearing loss Paternal Grandmother   . Depression Sister   . Diabetes Sister   . Hypertension Sister   . Mental illness Sister  Social History   Tobacco Use  . Smoking status: Never Smoker  . Smokeless tobacco: Never Used  Substance Use Topics  . Alcohol use: Not Currently   Allergies  Allergen Reactions  . Pollen Extract Other (See Comments)      Review of Systems  Constitutional: Positive for fatigue (minimal). Negative for appetite change.  HENT: Negative for congestion, postnasal drip and sore throat.   Eyes: Negative.   Respiratory: Positive for cough (dry ). Negative for shortness of breath.   Cardiovascular: Negative for chest pain, palpitations and leg swelling.  Gastrointestinal: Negative for abdominal distention and  abdominal pain.  Endocrine: Negative.   Genitourinary: Negative.   Musculoskeletal: Negative for back pain and neck pain.  Skin: Negative.   Allergic/Immunologic: Negative.   Neurological: Negative for dizziness and light-headedness.  Hematological: Negative for adenopathy. Does not bruise/bleed easily.  Psychiatric/Behavioral: Negative for dysphoric mood and sleep disturbance (sleeping on 2 pillows). The patient is not nervous/anxious.     Physical Exam Vitals and nursing note reviewed.  Constitutional:      Appearance: Normal appearance.  HENT:     Head: Normocephalic and atraumatic.  Cardiovascular:     Rate and Rhythm: Normal rate and regular rhythm.  Pulmonary:     Effort: Pulmonary effort is normal. No respiratory distress.     Breath sounds: No wheezing or rales.  Abdominal:     General: There is no distension.     Palpations: Abdomen is soft.  Musculoskeletal:        General: No tenderness.     Cervical back: Normal range of motion and neck supple.     Right lower leg: Edema (1+ pitting) present.     Left lower leg: Edema (1+ pitting) present.  Skin:    General: Skin is warm and dry.  Neurological:     General: No focal deficit present.     Mental Status: He is alert and oriented to person, place, and time.  Psychiatric:        Mood and Affect: Mood normal.        Behavior: Behavior normal.        Thought Content: Thought content normal.    Assessment & Plan:  1: Chronic heart failure with preserved ejection fraction with structural changes- - NYHA class II - euvolemic today - weighing daily; reminded to call for an overnight weight gain of >2 pounds or a weekly weight gain of >5 pounds - weight up 12 pounds from last visit here 5 weeks ago although patient says that his home weight is "stable" - not adding salt to his food and is trying to read labels for sodium content; reviewed the importance of closely following a 2000mg  sodium diet - saw cardiology  ([facility]) 08/27/19  - BNP 02/19/20 was 267.9  2: HTN- - BP - saw PCP 02/21/20) 01/18/20 - BMP 02/26/20 reviewed and showed sodium 137, potassium 4.2, creatinine 1.68 and GFR 49  3: DM- - A1c 02/20/20 was 12.0% - nonfasting glucose in clinic today was   4: Lymphedema- - stage 2 - elevating his legs when sitting for long periods of time at home; works at [employer] so is standing for long periods of time while at work - has vein/ vascular appointment next week and they are going to wait until that appointment before ordering compression socks - consider lymphapress compression boots if edema persists.   Patient did not bring his medications nor a list. Each medication was verbally reviewed with  the patient and he was encouraged to bring the bottles to every visit to confirm accuracy of list.

## 2020-03-06 ENCOUNTER — Ambulatory Visit: Payer: Medicaid Other | Admitting: Family

## 2020-03-06 ENCOUNTER — Telehealth: Payer: Self-pay | Admitting: Family

## 2020-03-06 ENCOUNTER — Telehealth: Payer: Self-pay

## 2020-03-06 NOTE — Telephone Encounter (Signed)
Pt dropped off fmla forms from [employer]. Placed in Arnett's folder up front. Please call back when completed.

## 2020-03-06 NOTE — Telephone Encounter (Signed)
Patient did not show for his [facility] appointment on 03/06/20. Will attempt to reschedule.  

## 2020-03-08 ENCOUNTER — Ambulatory Visit: Payer: Medicaid Other | Attending: Family | Admitting: Family

## 2020-03-08 ENCOUNTER — Encounter: Payer: Self-pay | Admitting: Family

## 2020-03-08 ENCOUNTER — Other Ambulatory Visit: Payer: Self-pay

## 2020-03-08 VITALS — BP 186/94 | HR 73 | Resp 20 | Ht 70.0 in | Wt 379.4 lb

## 2020-03-08 DIAGNOSIS — Z79899 Other long term (current) drug therapy: Secondary | ICD-10-CM | POA: Insufficient documentation

## 2020-03-08 DIAGNOSIS — E1122 Type 2 diabetes mellitus with diabetic chronic kidney disease: Secondary | ICD-10-CM | POA: Insufficient documentation

## 2020-03-08 DIAGNOSIS — N182 Chronic kidney disease, stage 2 (mild): Secondary | ICD-10-CM

## 2020-03-08 DIAGNOSIS — I890 Lymphedema, not elsewhere classified: Secondary | ICD-10-CM | POA: Insufficient documentation

## 2020-03-08 DIAGNOSIS — Z8673 Personal history of transient ischemic attack (TIA), and cerebral infarction without residual deficits: Secondary | ICD-10-CM | POA: Insufficient documentation

## 2020-03-08 DIAGNOSIS — N1831 Chronic kidney disease, stage 3a: Secondary | ICD-10-CM | POA: Insufficient documentation

## 2020-03-08 DIAGNOSIS — Z8249 Family history of ischemic heart disease and other diseases of the circulatory system: Secondary | ICD-10-CM | POA: Insufficient documentation

## 2020-03-08 DIAGNOSIS — Z8349 Family history of other endocrine, nutritional and metabolic diseases: Secondary | ICD-10-CM | POA: Insufficient documentation

## 2020-03-08 DIAGNOSIS — Z7951 Long term (current) use of inhaled steroids: Secondary | ICD-10-CM | POA: Insufficient documentation

## 2020-03-08 DIAGNOSIS — Z7982 Long term (current) use of aspirin: Secondary | ICD-10-CM | POA: Insufficient documentation

## 2020-03-08 DIAGNOSIS — I130 Hypertensive heart and chronic kidney disease with heart failure and stage 1 through stage 4 chronic kidney disease, or unspecified chronic kidney disease: Secondary | ICD-10-CM | POA: Insufficient documentation

## 2020-03-08 DIAGNOSIS — I10 Essential (primary) hypertension: Secondary | ICD-10-CM

## 2020-03-08 DIAGNOSIS — Z833 Family history of diabetes mellitus: Secondary | ICD-10-CM | POA: Insufficient documentation

## 2020-03-08 DIAGNOSIS — Z7952 Long term (current) use of systemic steroids: Secondary | ICD-10-CM | POA: Insufficient documentation

## 2020-03-08 DIAGNOSIS — I5032 Chronic diastolic (congestive) heart failure: Secondary | ICD-10-CM | POA: Insufficient documentation

## 2020-03-08 LAB — GLUCOSE, CAPILLARY: Glucose-Capillary: 262 mg/dL — ABNORMAL HIGH (ref 70–99)

## 2020-03-08 NOTE — Patient Instructions (Signed)
Continue weighing daily and call for an overnight weight gain of > 2 pounds or a weekly weight gain of >5 pounds. 

## 2020-03-08 NOTE — Progress Notes (Signed)
Cardiology Office Note:    Date:  03/08/2020   ID:  Jeremiah Vance, DOB 03-10-1971, MRN [id number]  PCP:  Theadore Nan, NP  [facility] Cardiologist:  Debbe Odea, MD  [facility] Electrophysiologist:  None   Referring MD: Theadore Nan, NP   Chief Complaint: Hospital follow-up  History of Present Illness:    Jeremiah Vance is a 49 y.o. male with a hx of chronic diastolic heart failure, HTN, morbid obesity, prior stroke x 2, poorly controlled diabetes, OSA with noncompliance of CPAP, CKD stage 3 who was recently admitted for CHF, respiratory failure requiring 1L O2,  and hypertensive urgency with SBPs up to the 190s. Suspected heart failure secondary to dietary noncompliance. Home coreg, hydralazine, and losartan were continued. He was diuresed with IV lasix. Most recent echo 07/2019 showed EF 50-55%. He was sent home on Jardiance, torsemide, coreg, Losartan with plan to possibly transition to entresto. His father passed so patient was discharged home.   Today, he reports he has been doing well. Breathing is back to normal. Stress is still high dealing with issues after his father passing. Reports compliance with all his medications. He has not been taking his weights at home. He has a scale but forgets to check. Weight at discharge was 380lbs, today weight is 381lbs. He is taking torsemide 40mg . He follows low salt diet. BP is high today, 170/100. He does not take it at home. BP re-check by my check was 168/102. He does have 1+ b/l edema on exam, which he reports is unchanged since discharge. Patient denies shortness of breath, orthopnea, PND, fever, chills, nasuea, vomiting.   Past Medical History:  Diagnosis Date   Acute ischemic stroke (HCC) 09/06/2017   Acute renal failure superimposed on stage 3a chronic kidney disease (HCC) 07/08/2019   Acute right-sided weakness    Allergies    Anasarca    Anemia 07/10/2017   Arthritis    CHF (congestive heart failure)  (HCC)    "coinsided w/kidney problems I was having 06/2017"   Chicken pox    CKD (chronic kidney disease) stage 3, GFR 30-59 ml/min (HCC) 06/2017   Depression    Elevated troponin 07/08/2019   High cholesterol    History of cardiomyopathy    LVEF 40 to 45% in April 2019 - subsequently normalized   Hyperbilirubinemia 07/10/2017   Hypertension    Ischemic stroke (HCC)    Small left internal capsule infarct due to lacunar disease   Morbid obesity (HCC)    Normocytic anemia 12/16/2017   Recurrent incisional hernia with incarceration s/p repair 10/22/2017 10/21/2017   Stroke (HCC) 08/2017   "right sided weakness since; getting stronger though" (10/22/2017)   Type 2 diabetes mellitus (HCC)     Past Surgical History:  Procedure Laterality Date   ABDOMINAL HERNIA REPAIR  2008; 10/22/2017   "scope; OPEN REPAIR INCARCERATED VENTRAL HERNIA   HERNIA REPAIR     KNEE ARTHROSCOPY Right 1989   VENTRAL HERNIA REPAIR N/A 10/22/2017   Procedure: OPEN REPAIR INCARCERATED VENTRAL HERNIA;  Surgeon: 10/24/2017, MD;  Location: [facility];  Service: General;  Laterality: N/A;    Current Medications: No outpatient medications have been marked as taking for the 03/09/20 encounter (Appointment) with 14/9/21, PA-C.     Allergies:   Pollen extract   Social History   Socioeconomic History   Marital status: Legally Separated    Spouse name: Not on file   Number of children: Not on  file   Years of education: Not on file   Highest education level: Not on file  Occupational History   Not on file  Tobacco Use   Smoking status: Never Smoker   Smokeless tobacco: Never Used  Vaping Use   Vaping Use: Never used  Substance and Sexual Activity   Alcohol use: Not Currently   Drug use: Never   Sexual activity: Not Currently  Other Topics Concern   Not on file  Social History Narrative   He lives with his sister , [name] and she is his MPOA after his stokes   Social  Determinants of Corporate investment banker Strain: High Risk   Difficulty of Paying Living Expenses: Hard  Food Insecurity:    Worried About Programme researcher, broadcasting/film/video in the Last Year: Not on file   [employer] of Food in the Last Year: Not on file  Transportation Needs: Unmet Transportation Needs   Lack of Transportation (Medical): Yes   Lack of Transportation (Non-Medical): Yes  Physical Activity:    Days of Exercise per Week: Not on file   Minutes of Exercise per Session: Not on file  Stress:    Feeling of Stress : Not on file  Social Connections:    Frequency of Communication with Friends and Family: Not on file   Frequency of Social Gatherings with Friends and Family: Not on file   Attends Religious Services: Not on file   Active Member of Clubs or Organizations: Not on file   Attends Banker Meetings: Not on file   Marital Status: Not on file     Family History: The patient's family history includes Arthritis in his father, mother, paternal grandfather, and paternal grandmother; Depression in his maternal grandmother, mother, and sister; Diabetes in his father, maternal grandmother, mother, sister, and sister; Hearing loss in his father and paternal grandmother; Heart attack in his father and paternal grandfather; Heart disease in his father, maternal grandmother, mother, and paternal grandfather; Hyperlipidemia in his father, maternal grandfather, and maternal grandmother; Hypertension in his father, maternal grandfather, maternal grandmother, mother, and sister; Learning disabilities in his mother; Mental illness in his mother and sister; Stroke in his father, maternal grandfather, maternal grandmother, mother, and paternal grandfather.  ROS:   Please see the history of present illness.     All other systems reviewed and are negative.  EKGs/Labs/Other Studies Reviewed:    The following studies were reviewed today:  Echo 07/2019 1. Left ventricular ejection  fraction, by estimation, is 50 to 55%. The  left ventricle has low normal function. The left ventricle demonstrates  global hypokinesis. There is mild left ventricular hypertrophy. Left  ventricular diastolic parameters are  consistent with Grade II diastolic dysfunction (pseudonormalization).  2. Right ventricular systolic function is normal. The right ventricular  size is mildly enlarged. Tricuspid regurgitation signal is inadequate for  assessing PA pressure.  3. Left atrial size was mildly dilated.    EKG:  EKG is  ordered today.  The ekg ordered today demonstrates NSR, 67bpm, TWI aVL  Recent Labs: 02/19/2020: B Natriuretic Peptide 267.9 02/20/2020: Magnesium 2.0; TSH 1.065 02/26/2020: ALT 17; BUN 40; Creatinine, Ser 1.68; Hemoglobin 14.3; Platelets 259; Potassium 4.2; Sodium 137  Recent Lipid Panel    Component Value Date/Time   CHOL 138 01/18/2020 1354   CHOL 115 06/17/2018 1455   TRIG 88.0 01/18/2020 1354   HDL 35.80 (L) 01/18/2020 1354   HDL 41 06/17/2018 1455   CHOLHDL 4 01/18/2020  1354   VLDL 17.6 01/18/2020 1354   LDLCALC 85 01/18/2020 1354   LDLCALC 53 06/17/2018 1455     Risk Assessment/Calculations:       Physical Exam:    VS:  There were no vitals taken for this visit.    Wt Readings from Last 3 Encounters:  03/08/20 (!) 379 lb 6 oz (172.1 kg)  02/26/20 (!) 380 lb 8 oz (172.6 kg)  01/18/20 (!) 393 lb (178.3 kg)     GEN: Obese white male in no acute distress HEENT: Normal NECK: No JVD; No carotid bruits LYMPHATICS: No lymphadenopathy CARDIAC: RRR, no murmurs, rubs, gallops RESPIRATORY:  Clear to auscultation without rales, wheezing or rhonchi  ABDOMEN: Soft, non-tender, non-distended MUSCULOSKELETAL:  1+ b/l lower leg edema; No deformity  SKIN: Warm and dry NEUROLOGIC:  Alert and oriented x 3 PSYCHIATRIC:  Normal affect   ASSESSMENT:    No diagnosis found. PLAN:    In order of problems listed above:  Hypertension Blood pressure  significantly elevated today. He does not take pressures at home. He is taking coreg 18.72 BID, Losartan 50mg  BID and torsemide 40 mg daily. I will increase coreg to 25mg  BID and losartan to 100 mg daily. Also increase torsemide to 80 mg BID for 3 days for lower leg edema. If pressure is still high at follow-up can consider transition to Valsartan. Also might need addition of another antihypertensive. BMET today and at follow-up in 1 week.   HFpEF EF 50-55% by echo 07/2019. He was discharged about 11lbs above dry weight Discharge weight was 380lbs. Today he is 381lbs. Reports compliance with torsemide 40 mg daily and no salt diet. He still has 1+ b/l lower leg edema on exam. Will increase Torsemide to 80 mg BID for 3 days and then back down to 40 mg daily. Encouraged daily weights. Will increase coreg and losartan as above. BMET today and he needs to be seen back in 1 week. If patient continue to show compliance with medications can consider transition to [facility] at follow-up.   Poorly controlled diabetes Most recent A1C 12.0. Noncompliance has been an issue. Jardiance added during hospitalization. Labs today  CKD stage 3 creatinine 1.66 at discharge. Baseline creatinine 1.2-1.4. BMET today  OSA Encouraged compliance  Disposition: Follow up in 1 week(s) with APP/MD   Shared Decision Making/Informed Consent        Signed, Monterrio Gerst 07/2019, PA-C  03/08/2020 3:30 PM    [facility]

## 2020-03-08 NOTE — Telephone Encounter (Signed)
I called patient to try to find out what FMLA paperwork was for exactly since patient has not been seen since October. I was unable to LM due to VM being full. Will try to call back later on.

## 2020-03-08 NOTE — Telephone Encounter (Signed)
Patient returned call stated that he would be at home after 12 to answer call at doctor ' appointment now

## 2020-03-08 NOTE — Progress Notes (Signed)
Patient ID: Jeremiah Vance, male    DOB: [birth year], 49 y.o.   MRN: [id number]   Mr Janik is a 49 y/o male with a history of DM, hyperlipidemia, HTN, CKD, stroke, morbid obesity and chronic heart failure.   Echo report from 07/09/19 reviewed and showed an EF of 50-55% along with mild LVH.  Admitted 02/19/20 due to acute on chronic HF. Cardiology consult obtained. Initially given IV lasix with transition to oral diuretics. Nebulizer, inhalers and steroids started. CXR negative for pneumonia. Needed oxygen at 3L to maintain sats. Uncontrolled diabetes but due to sudden death in patient's family, he was needing discharged. Discharged after 7 days.   He presents today for a follow-up visit with a chief complaint of minimal fatigue upon moderate exertion. He describes this as chronic in nature having been present for several years. He has associated cough, wheezing, depression and difficulty sleeping along with this. He denies any abdominal distention, palpitations, pedal edema, chest pain, shortness of breath, dizziness or weight gain.   Says that he hasn't taken any of his medications yet today because he overslept and rushed to get here on time. Does report some depression as his dad died suddenly right after Thanksgiving so he's still grieving.   Past Medical History:  Diagnosis Date  . Acute ischemic stroke ([place]) 09/06/2017  . Acute renal failure superimposed on stage 3a chronic kidney disease ([place]) 07/08/2019  . Acute right-sided weakness   . Allergies   . Anasarca   . Anemia 07/10/2017  . Arthritis   . CHF (congestive heart failure) ([place])    "coinsided w/kidney problems I was having 06/2017"  . Chicken pox   . CKD (chronic kidney disease) stage 3, GFR 30-59 ml/min (HCC) 06/2017  . Depression   . Elevated troponin 07/08/2019  . High cholesterol   . History of cardiomyopathy    LVEF 40 to 45% in April 2019 - subsequently normalized  . Hyperbilirubinemia 07/10/2017  . Hypertension   .  Ischemic stroke ([place])    Small left internal capsule infarct due to lacunar disease  . Morbid obesity ([place])   . Normocytic anemia 12/16/2017  . 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 Stroke ([place]) 08/2017   "right sided weakness since; getting stronger though" (10/22/2017)  . Type 2 diabetes mellitus ([place])    Past Surgical History:  Procedure Laterality Date  . ABDOMINAL HERNIA REPAIR  2008; 10/22/2017   "scope; OPEN REPAIR INCARCERATED VENTRAL HERNIA  . HERNIA REPAIR    . KNEE ARTHROSCOPY Right 1989  . VENTRAL HERNIA REPAIR N/A 10/22/2017   Procedure: OPEN REPAIR INCARCERATED VENTRAL HERNIA;  Surgeon: Excell Seltzer, MD;  Location: [facility] OR;  Service: General;  Laterality: N/A;   Family History  Problem Relation Age of Onset  . Diabetes Father   . Heart disease Father   . Arthritis Father   . Hearing loss Father   . Hyperlipidemia Father   . Heart attack Father   . Hypertension Father   . Stroke Father   . Diabetes Sister   . Diabetes Mother   . Stroke Mother   . Arthritis Mother   . Depression Mother   . Heart disease Mother   . Hypertension Mother   . Learning disabilities Mother   . Mental illness Mother   . Diabetes Maternal Grandmother   . Heart disease Maternal Grandmother   . Depression Maternal Grandmother   . Hyperlipidemia Maternal Grandmother   . Hypertension Maternal  Grandmother   . Stroke Maternal Grandmother   . Stroke Paternal Grandfather   . Heart disease Paternal Grandfather   . Arthritis Paternal Grandfather   . Heart attack Paternal Grandfather   . Hypertension Maternal Grandfather   . Hyperlipidemia Maternal Grandfather   . Stroke Maternal Grandfather   . Arthritis Paternal Grandmother   . Hearing loss Paternal Grandmother   . Depression Sister   . Diabetes Sister   . Hypertension Sister   . Mental illness Sister    Social History   Tobacco Use  . Smoking status: Never Smoker  . Smokeless tobacco:  Never Used  Substance Use Topics  . Alcohol use: Not Currently   Allergies  Allergen Reactions  . Pollen Extract Other (See Comments)   Prior to Admission medications   Medication Sig Start Date End Date Taking? Authorizing Provider  aspirin 81 MG chewable tablet Chew 1 tablet (81 mg total) by mouth daily. 09/20/19  Yes Darylene Price A, FNP  atorvastatin (LIPITOR) 40 MG tablet Take 1 tablet (40 mg total) by mouth daily at 6 PM. 09/20/19  Yes Sharmain Lastra, Aura Fey, FNP  benzonatate (TESSALON) 200 MG capsule Take 1 capsule (200 mg total) by mouth 3 (three) times daily as needed for cough. 02/26/20  Yes [name], MD  blood glucose meter kit and supplies Dispense based on patient and insurance preference. Use up to four times daily as directed. (FOR ICD-10 E10.9, E11.9). 08/03/19  Yes [name], NP  carvedilol (COREG) 6.25 MG tablet Take 3 tablets (18.75 mg total) by mouth 2 (two) times daily with a meal. 02/23/20  Yes [name], MD  chlorpheniramine-HYDROcodone (TUSSIONEX) 10-8 MG/5ML SUER Take 5 mLs by mouth every 12 (twelve) hours as needed for cough. 02/26/20  Yes [name], MD  cyclobenzaprine (FLEXERIL) 5 MG tablet Take 1 tablet (5 mg total) by mouth 3 (three) times daily as needed for muscle spasms. 02/23/20  Yes [name], MD  Dulaglutide (TRULICITY) 1.5 FK/8.1EX SOPN Inject 1.5 mg into the skin once a week. 02/23/20  Yes [name], MD  empagliflozin (JARDIANCE) 10 MG TABS tablet Take 1 tablet (10 mg total) by mouth daily before breakfast. 02/26/20  Yes Samtani, Jai-Gurmukh, MD  ipratropium-albuterol (DUONEB) 0.5-2.5 (3) MG/3ML SOLN Take 3 mLs by nebulization 3 (three) times daily. 02/26/20  Yes [name], MD  losartan (COZAAR) 50 MG tablet Take 1 tablet (50 mg total) by mouth daily. 02/26/20  Yes [name], MD  predniSONE (DELTASONE) 20 MG tablet Take 1 tablet (20 mg total) by mouth daily before breakfast. 02/27/20   Yes [name], MD  torsemide (DEMADEX) 20 MG tablet Take 2 tablets (40 mg total) by mouth daily. 02/23/20  Yes [name], MD  budesonide (PULMICORT) 0.5 MG/2ML nebulizer solution Take 4 mLs (1 mg total) by nebulization 2 (two) times daily. Patient not taking: Reported on 03/08/2020 02/26/20 02/20/21  [name], MD     Review of Systems  Constitutional: Positive for fatigue (minimal). Negative for appetite change.  HENT: Negative for congestion, postnasal drip and sore throat.   Eyes: Negative.   Respiratory: Positive for cough (productive) and wheezing ("improving"). Negative for shortness of breath.   Cardiovascular: Negative for chest pain, palpitations and leg swelling.  Gastrointestinal: Negative for abdominal distention and abdominal pain.  Endocrine: Negative.   Genitourinary: Negative.   Musculoskeletal: Negative for back pain and neck pain.  Skin: Negative.   Allergic/Immunologic: Negative.   Neurological: Negative for dizziness and light-headedness.  Hematological:  Negative for adenopathy. Does not bruise/bleed easily.  Psychiatric/Behavioral: Positive for dysphoric mood and sleep disturbance (sleeping on 1 pillow; trouble sleeping due to loss of dad). The patient is not nervous/anxious.    Vitals:   03/08/20 0932  BP: (!) 186/94  Pulse: 73  Resp: 20  SpO2: 95%  Weight: (!) 379 lb 6 oz (172.1 kg)  Height: '5\' 10"'  (1.778 m)   Wt Readings from Last 3 Encounters:  03/08/20 (!) 379 lb 6 oz (172.1 kg)  02/26/20 (!) 380 lb 8 oz (172.6 kg)  01/18/20 (!) 393 lb (178.3 kg)   Lab Results  Component Value Date   CREATININE 1.68 (H) 02/26/2020   CREATININE 1.70 (H) 02/25/2020   CREATININE 1.77 (H) 02/24/2020    Physical Exam Vitals and nursing note reviewed.  Constitutional:      Appearance: Normal appearance.  HENT:     Head: Normocephalic and atraumatic.  Cardiovascular:     Rate and Rhythm: Normal rate and regular rhythm.  Pulmonary:      Effort: Pulmonary effort is normal. No respiratory distress.     Breath sounds: No wheezing or rales.  Abdominal:     General: There is no distension.     Palpations: Abdomen is soft.  Musculoskeletal:        General: No tenderness.     Cervical back: Normal range of motion and neck supple.     Right lower leg: Edema (1+ pitting) present.     Left lower leg: Edema (1+ pitting) present.  Skin:    General: Skin is warm and dry.  Neurological:     General: No focal deficit present.     Mental Status: He is alert and oriented to person, place, and time.  Psychiatric:        Mood and Affect: Mood normal.        Behavior: Behavior normal.        Thought Content: Thought content normal.    Assessment & Plan:  1: Chronic heart failure with preserved ejection fraction with structural changes- - NYHA class II - euvolemic today - weighing daily; reminded to call for an overnight weight gain of >2 pounds or a weekly weight gain of >5 pounds - weight stable from last visit here 7 months ago - not adding salt to his food and is trying to read labels for sodium content; reviewed the importance of closely following a 2019m sodium diet - saw cardiology (Agbor-Etang) 08/27/19 - BNP 02/19/20 was 267.9  2: HTN- - BP elevated (186/94) but he says that he hasn't taken his medications today due to waking up late and rushing to get to the appt - saw PCP (Jerelene Redden 01/18/20 - BMP 02/26/20 reviewed and showed sodium 137, potassium 4.2, creatinine 1.68 and GFR 49  3: DM- - A1c 02/20/20 was 12.0% - fasting glucose in clinic today was 262  4: Lymphedema- - stage 2 - elevating his legs when sitting for long periods of time at home; is currently not working - encouraged to get compression socks, put them on every morning and remove them at bedtime - consider lymphapress compression boots if edema persists.   Patient did not bring his medications nor a list. Each medication was verbally reviewed  with the patient and he was encouraged to bring the bottles to every visit to confirm accuracy of list.  Return in 3 months or sooner for any questions/problems before then.

## 2020-03-09 ENCOUNTER — Ambulatory Visit (INDEPENDENT_AMBULATORY_CARE_PROVIDER_SITE_OTHER): Payer: Self-pay | Admitting: Medical

## 2020-03-09 ENCOUNTER — Other Ambulatory Visit: Payer: Self-pay

## 2020-03-09 ENCOUNTER — Ambulatory Visit: Payer: Medicaid Other | Admitting: Pharmacist

## 2020-03-09 ENCOUNTER — Encounter: Payer: Self-pay | Admitting: Physician Assistant

## 2020-03-09 ENCOUNTER — Telehealth: Payer: Self-pay | Admitting: Medical

## 2020-03-09 ENCOUNTER — Other Ambulatory Visit
Admission: RE | Admit: 2020-03-09 | Discharge: 2020-03-09 | Disposition: A | Discharge status: Home / Self Care | Payer: Medicaid Other | Attending: Physician Assistant | Admitting: Physician Assistant

## 2020-03-09 VITALS — BP 170/100 | HR 67 | Ht 70.0 in | Wt 381.0 lb

## 2020-03-09 DIAGNOSIS — I5032 Chronic diastolic (congestive) heart failure: Secondary | ICD-10-CM | POA: Insufficient documentation

## 2020-03-09 DIAGNOSIS — I10 Essential (primary) hypertension: Secondary | ICD-10-CM

## 2020-03-09 DIAGNOSIS — E1122 Type 2 diabetes mellitus with diabetic chronic kidney disease: Secondary | ICD-10-CM

## 2020-03-09 DIAGNOSIS — I5030 Unspecified diastolic (congestive) heart failure: Secondary | ICD-10-CM

## 2020-03-09 DIAGNOSIS — N182 Chronic kidney disease, stage 2 (mild): Secondary | ICD-10-CM

## 2020-03-09 DIAGNOSIS — G4733 Obstructive sleep apnea (adult) (pediatric): Secondary | ICD-10-CM

## 2020-03-09 DIAGNOSIS — E1165 Type 2 diabetes mellitus with hyperglycemia: Secondary | ICD-10-CM

## 2020-03-09 DIAGNOSIS — E785 Hyperlipidemia, unspecified: Secondary | ICD-10-CM

## 2020-03-09 LAB — BASIC METABOLIC PANEL
Anion gap: 5 (ref 5–15)
BUN: 16 mg/dL (ref 6–20)
CO2: 27 mmol/L (ref 22–32)
Calcium: 8.3 mg/dL — ABNORMAL LOW (ref 8.9–10.3)
Chloride: 103 mmol/L (ref 98–111)
Creatinine, Ser: 1.31 mg/dL — ABNORMAL HIGH (ref 0.61–1.24)
GFR, Estimated: >60 mL/min (ref >60)
Glucose, Bld: 255 mg/dL — ABNORMAL HIGH (ref 70–99)
Potassium: 4.4 mmol/L (ref 3.5–5.1)
Sodium: 135 mmol/L (ref 135–145)

## 2020-03-09 MED ORDER — LOSARTAN POTASSIUM 50 MG PO TABS: 100.0000 mg | ORAL_TABLET | Freq: Every day | ORAL | 3 refills | Status: DC

## 2020-03-09 MED ORDER — TRESIBA FLEXTOUCH 200 UNIT/ML ~~LOC~~ SOPN: 10.0000 [IU] | PEN_INJECTOR | Freq: Every day | SUBCUTANEOUS | 0 refills | Status: DC

## 2020-03-09 MED ORDER — CARVEDILOL 6.25 MG PO TABS: 25.0000 mg | ORAL_TABLET | Freq: Two times a day (BID) | ORAL | 3 refills | Status: DC

## 2020-03-09 MED ORDER — TORSEMIDE 20 MG PO TABS: 40.0000 mg | ORAL_TABLET | Freq: Every day | ORAL | 3 refills | Status: DC

## 2020-03-09 NOTE — Telephone Encounter (Signed)
Cadence David Stall, PA-C  03/09/2020 2:54 PM EST      Please call and inform patient his kidney function is improved since discharge. Continue with current plan and follow-up.

## 2020-03-09 NOTE — Patient Instructions (Addendum)
Mr. Boxx,   Here is the insulin. Please inject Tresiba 10 units once daily. Please return the application to [facility] so that they can continue to provide you medications, and can get the [place] and Jardiance for you from the manufacturer programs.   [facility] is going to be taking over the care of patients of [facility]. Please call their office at [phone] to schedule an appointment. You need to connect with a provider to continue to manage your diabetes.   I'll call you in two weeks to follow up on your sugars.   Catie Feliz Beam, PharmD [phone]  Visit Information  Patient Care Plan: Medication Management    Problem Identified: Diabetes, Heart Failure     Long-Range Goal: Disease Progression Prevention   This Visit's Progress: On track  Priority: High  Note:   Current Barriers:  . Unable to independently afford treatment regimen . Unable to independently monitor therapeutic efficacy . Unable to achieve control of diabetes, heart failure   Pharmacist Clinical Goal(s):  [name] Kitchen Over the next 90 days, patient will verbalize ability to afford treatment regimen. [name] Kitchen achieve adherence to monitoring guidelines and medication adherence to achieve therapeutic efficacy. through collaboration with PharmD and provider.   Interventions: . Inter-disciplinary care team collaboration (see longitudinal plan of care) . Comprehensive medication review performed; medication list updated in electronic medical record  Health Management: . Was napping when I called. He notes they are going back down to [employer] to manage his father's belongings.  . Family Planning Medicaid. Have attempted to connect with [facility], but patient has not returned financial information to them yet.   Diabetes: . Uncontrolled; current treatment: Jardiance 10 mg daily, but notes he doesn't have any of this. Reports it is "on back order", but he has not  returned paperwork to [facility] to be enrolled in patient assistance.  o Previously on Trulicity, d/c at hospitalization. Patient notes GI upset with this.  o Hx metformin, notes that it "made him sick". Unsure if XR was tried . Current glucose readings: not checking. Glucose on BMET today 233 . Denies hyperglycemic symptoms, including polyuria, polydipsia, nocturia, polyphagia . Discussed need to improve glucose readings. Start Tresiba 10 units daily. Providing a sample from the office while working on [facility]ication [facility] obtainment. Patient notes he will try to come by the office this afternoon or tomorrow before leaving town to pick up [place] . Collaboration with [employer] [facility] to connect to schedule, as they will be taking on primary care of this patient  Heart Failure: [name] Kitchen Uncontrolled; current treatment: losartan 100 mg daily (taking 2 50 mg tabs), carvedilol 25 mg daily (taking 4 6.25 mg tabs BID); torsemide 80 mg daily x 3 days, then reduce to 40 mg daily; follows w/ HF clinic and [facility] .  . Encouraged continued adherence to regimen, follow up with cardiology  Hyperlipidemia and ASCVD risk reduction: . Uncontrolled; current treatment: atorvastatin 40 mg daily  . Antiplatelet regimen: aspirin 81 mg daily  . Recommended to continue current regimen at this time, along with collaboration with cardiology   Patient Goals/Self-Care Activities . Over the next 90 days, patient will:  - take medications as prescribed check blood glucose BID, document, and provide at future appointments check blood pressure daily, document, and provide at future appointments weigh daily, and contact provider if weight gain of >3 lbs/day, >5 lbs/week  Follow Up Plan: Telephone follow up appointment with care management team member scheduled  for: ~ 2 weeks      Print copy of patient instructions, educational materials, and care plan provided in person.  Plan:  Telephone follow up appointment with care management team member scheduled for: ~ 2 weeks  Catie Feliz Beam, PharmD, [place], CPP Clinical Pharmacist [facility] [employer] [phone]

## 2020-03-09 NOTE — Chronic Care Management (AMB) (Signed)
**Note Jeremiah-Identified via Obfuscation** Care Management   Pharmacy Note  03/09/2020 Name: Jeremiah Vance MRN: [id number] DOB: 08/15/1970   Subjective:  Jeremiah Vance is a 49 y.o. year old male who is a primary care patient of Jeremiah Regal, NP. The Care Management/Care Coordination team team was consulted for assistance with care management and care coordination needs.    Engaged with patient by telephone for follow up visit in response to provider referral for pharmacy case management and/or care coordination services.   Consent to Services:  Jeremiah Vance was given information about care management/care coordination services, agreed to services, and gave verbal consent prior to initiation of services. Please see initial visit note for detailed documentation.   Review of patient status, including review of consultants reports, laboratory and other test data, was performed as part of comprehensive evaluation and provision of chronic care management services.   SDOH (Social Determinants of Health) assessments and interventions performed: no   Objective:  Lab Results  Component Value Date   CREATININE 1.31 (H) 03/09/2020   CREATININE 1.68 (H) 02/26/2020   CREATININE 1.70 (H) 02/25/2020    Lab Results  Component Value Date   HGBA1C 12.0 (H) 02/20/2020       Component Value Date/Time   CHOL 138 01/18/2020 1354   CHOL 115 06/17/2018 1455   TRIG 88.0 01/18/2020 1354   HDL 35.80 (L) 01/18/2020 1354   HDL 41 06/17/2018 1455   CHOLHDL 4 01/18/2020 1354   VLDL 17.6 01/18/2020 1354   LDLCALC 85 01/18/2020 1354   LDLCALC 53 06/17/2018 1455      BP Readings from Last 3 Encounters:  03/09/20 (!) 170/100  03/08/20 (!) 186/94  02/26/20 (!) 128/56    Assessment:   Allergies  Allergen Reactions  . Pollen Extract Other (See Comments)    Medications Reviewed Today    Reviewed by Jeremiah Vance, RPH-CPP (Pharmacist) on 03/09/20 at 53  Med List Status: <None>  Medication Order Taking? Sig Documenting  Provider Last Dose Status Informant  aspirin 81 MG chewable tablet [id number] Yes Chew 1 tablet (81 mg total) by mouth daily. Jeremiah Graff, FNP Taking Active Self  atorvastatin (LIPITOR) 40 MG tablet [id number] Yes Take 1 tablet (40 mg total) by mouth daily at 6 PM. Jeremiah Graff, FNP Taking Active Self  blood glucose meter kit and supplies [id number] Yes Dispense based on patient and insurance preference. Use up to four times daily as directed. (FOR ICD-10 E10.9, E11.9). Jeremiah Regal, NP Taking Active Self  carvedilol (COREG) 6.25 MG tablet [id number] Yes Take 4 tablets (25 mg total) by mouth 2 (two) times daily with a meal. Jeremiah Vance, Areta Haber, PA-C Taking Active   empagliflozin (JARDIANCE) 10 MG TABS tablet [id number] No Take 1 tablet (10 mg total) by mouth daily before breakfast.  Patient not taking: Reported on 03/09/2020   Jeremiah Sells, MD Not Taking Active   losartan (COZAAR) 50 MG tablet [id number] Yes Take 2 tablets (100 mg total) by mouth daily. Rise Mu, PA-C Taking Active   torsemide (DEMADEX) 20 MG tablet [id number] Yes Take 2 tablets (40 mg total) by mouth daily. Rise Mu, PA-C Taking Active           Patient Active Problem List   Diagnosis Date Noted  . Cough   . Other cirrhosis of liver ([place])   . Ventral hernia without obstruction or gangrene   . Acute CHF (congestive heart failure) ([place]) 02/20/2020  . Shortness of breath   .  Hypertensive urgency   . Acute respiratory failure with hypoxia ([place])   . Neck pain   . Prostate cancer screening 01/21/2020  . Lymphedema of both lower extremities 10/17/2019  . History of stroke 10/17/2019  . CKD (chronic kidney disease) stage 3, GFR 30-59 ml/min (H[facility]) 08/31/2019  . Swelling of limb 08/31/2019  . Type 2 diabetes mellitus with hyperlipidemia ([place]) 07/08/2019  . Acute kidney injury superimposed on CKD ([place]) 07/08/2019  . Chest pain 07/08/2019  . Vitreous floaters of both eyes 06/03/2018  . Moderate obstructive sleep  apnea 02/11/2018  . Chronic fatigue 12/16/2017  . Essential hypertension 12/16/2017  . History of small bowel obstruction 10/24/2017  . Adjustment disorder with depressed mood 09/30/2017  . Stroke ([place]) 09/14/2017  . Acute on chronic heart failure with preserved ejection fraction (HFpEF) ([place])   . Hyperlipidemia   . Gout   . Elevated LFTs   . Heart failure with preserved ejection fraction ([place])   . Morbid obesity ([place])   . Hypoalbuminemia 07/11/2017  . Type 2 diabetes mellitus with hyperglycemia ([place]) 07/10/2017    Medication Assistance: patient needs to connect with [facility]ication Management Clinic  Patient Care Plan: Medication Management    Problem Identified: Diabetes, Heart Failure     Long-Range Goal: Patient Stated   This Visit's Progress: On track  Priority: High  Note:   Current Barriers:  . Unable to independently afford treatment regimen . Unable to independently monitor therapeutic efficacy . Unable to achieve control of diabetes, heart failure   Pharmacist Clinical Goal(s):  Jeremiah Vance Kitchen Over the next 90 days, patient will verbalize ability to afford treatment regimen. Jeremiah Vance Kitchen achieve adherence to monitoring guidelines and medication adherence to achieve therapeutic efficacy. through collaboration with PharmD and provider.   Interventions: . Inter-disciplinary care team collaboration (see longitudinal plan of care) . Comprehensive medication review performed; medication list updated in electronic medical record  Health Management: . Was napping when I called. He notes they are going back down to [employer] to manage his father's belongings.  . Family Planning Medicaid. Have attempted to connect with [facility], but patient has not returned financial information to them yet.   Diabetes: . Uncontrolled; current treatment: Jardiance 10 mg daily, but notes he doesn't have any of this o Previously on Trulicity, d/c at hospitalization. Patient notes GI upset  with this.  o  . Current glucose readings: not checking. Glucose on BMET today 233 . Denies hyperglycemic symptoms, including polyuria, polydipsia, nocturia, polyphagia . Discussed need to improve glucose readings. Start Tresiba 10 units daily. Providing a sample from the office while working on Medication Management obtainment. Patient notes he will try to come by the office this afternoon or tomorrow before leaving town to pick up [place]  Heart Failure: Jeremiah Vance Kitchen Uncontrolled; current treatment: losartan 100 mg daily (taking 2 50 mg tabs), carvedilol 25 mg daily (taking 4 6.25 mg tabs BID); torsemide 80 mg daily x 3 days, then reduce to 40 mg daily; follows w/ HF clinic and [facility].  . Encouraged continued adherence to regimen, follow up with cardiology  Hyperlipidemia and ASCVD risk reduction: . Uncontrolled; current treatment: atorvastatin 40 mg daily  . Antiplatelet regimen: aspirin 81 mg daily  . Recommended to continue current regimen at this time, along with collaboration with cardiology   Patient Goals/Self-Care Activities . Over the next 90 days, patient will:  - take medications as prescribed check blood glucose BID, document, and provide at future appointments check blood pressure daily,  document, and provide at future appointments weigh daily, and contact provider if weight gain of >3 lbs/day, >5 lbs/week  Follow Up Plan: Telephone follow up appointment with care management team member scheduled for: ~ 2 weeks       Plan: Telephone follow up appointment with care management team member scheduled for: ~ 2 weeks  Catie Darnelle Maffucci, PharmD, [place], [place] Pharmacist [place] [facility] [phone]

## 2020-03-09 NOTE — Patient Instructions (Signed)
Medication Instructions:  We have increase your Coreg to 25mg  two times a day Increased your Losartan to 100mg  once a day Please take your Torsemide 80mg  (2, 40mg  pills) two times a day for 3 days, then back to your regular dosing of 40mg  a day.   *If you need a refill on your cardiac medications before your next appointment, please call your pharmacy*   Lab Work: BMET If you have labs (blood work) drawn today and your tests are completely normal, you will receive your results only by: MyChart Message (if you have MyChart) OR . A paper copy in the mail If you have any lab test that is abnormal or we need to change your treatment, we will call you to review the results.   Testing/Procedures: none   Follow-Up: At [facility], you and your health needs are our priority.  As part of our continuing mission to provide you with exceptional heart care, we have created designated Provider Care Teams.  These Care Teams include your primary Cardiologist (physician) and Advanced Practice Providers (APPs -  Physician Assistants and Nurse Practitioners) who all work together to provide you with the care you need, when you need it.  We recommend signing up for the patient portal called "MyChart".  Sign up information is provided on this After Visit Summary.  MyChart is used to connect with patients for Virtual Visits (Telemedicine).  Patients are able to view lab/test results, encounter notes, upcoming appointments, etc.  Non-urgent messages can be sent to your provider as well.   To learn more about what you can do with MyChart, go to .    Your next appointment:   1 week(s)  The format for your next appointment:   In Person  Provider:   You may see , MD or one of the following Advanced Practice Providers on your designated Care Team:    , NP  [name] Kitchen, PA-C  [facility], PA-C  [email or web address], Debbe Odea  Nicolasa Ducking,  NP    Other Instructions Please take your blood pressure daily Check your weight daily     Blood Pressure Record Sheet To take your blood pressure, you will need a blood pressure machine. You can buy a blood pressure machine (blood pressure monitor) at your clinic, drug store, or online. When choosing one, consider:  An automatic monitor that has an arm cuff.  A cuff that wraps snugly around your upper arm. You should be able to fit only one finger between your arm and the cuff.  A device that stores blood pressure reading results.  Do not choose a monitor that measures your blood pressure from your wrist or finger. Follow your health care provider's instructions for how to take your blood pressure. To use this form:  Get one reading in the morning (a.m.) before you take any medicines.  Get one reading in the evening (p.m.) before supper.  Take at least 2 readings with each blood pressure check. This makes sure the results are correct. Wait 1-2 minutes between measurements.  Write down the results in the spaces on this form.  Repeat this once a week, or as told by your health care provider.  Make a follow-up appointment with your health care provider to discuss the results.      Blood pressure log Date: _______________________  a.m. _____________________(1st reading) _____________________(2nd reading)  p.m. _____________________(1st reading) _____________________(2nd reading) Date: _______________________  a.m. _____________________(1st reading) _____________________(2nd reading)  p.m. _____________________(1st reading) _____________________(2nd reading)  Date: _______________________  a.m. _____________________(1st reading) _____________________(2nd reading)  p.m. _____________________(1st reading) _____________________(2nd reading) Date: _______________________  a.m. _____________________(1st reading) _____________________(2nd reading)  p.m.  _____________________(1st reading) _____________________(2nd reading) Date: _______________________  a.m. _____________________(1st reading) _____________________(2nd reading)  p.m. _____________________(1st reading) _____________________(2nd reading)    Daily Weight Record It is important to weigh yourself daily. To do this:  Make sure you use a reliable scale. Use the same scale each day.  Keep this daily weight chart near your scale.  Weigh yourself each morning at the same time.  Before weighing yourself: ? Take off your shoes. ? Make sure you are wearing the same amount of clothing each day.  Write down your weight in the spaces on the form.  Compare today's weight to yesterday's weight.  Bring this form with you to your follow-up visits with your health care provider.        Call your health care provider if you have concerns about your weight, including rapid weight gain or loss. Date: ________ Weight: ____________________ Date: ________ Weight: ____________________ Date: ________ Weight: ____________________ Date: ________ Weight: ____________________ Date: ________ Weight: ____________________ Date: ________ Weight: ____________________ Date: ________ Weight: ____________________ Date: ________ Weight: ____________________ Date: ________ Weight: ____________________ Date: ________ Weight: ____________________ Date: ________ Weight: ____________________ Date: ________ Weight: ____________________ Date: ________ Weight: ____________________ Date: ________ Weight: ____________________ Date: ________ Weight: ____________________ Date: ________ Weight: ____________________ Date: ________ Weight: ____________________ Date: ________ Weight: ____________________ Date: ________ Weight: ____________________ Date: ________ Weight: ____________________ Date: ________ Weight: ____________________ Date: ________ Weight: ____________________ Date: ________ Weight:  ____________________ Date: ________ Weight: ____________________ Date: ________ Weight: ____________________ Date: ________ Weight: ____________________ Date: ________ Weight: ____________________ Date: ________ Weight: ____________________ Date: ________ Weight: ____________________ Date: ________ Weight: ____________________ Date: ________ Weight: ____________________ Date: ________ Weight: ____________________ Date: ________ Weight: ____________________ Date: ________ Weight: ____________________ Date: ________ Weight: ____________________ Date: ________ Weight: ____________________ Date: ________ Weight: ____________________ Date: ________ Weight: ____________________ Date: ________ Weight: ____________________ Date: ________ Weight: ____________________ Date: ________ Weight: ____________________ Date: ________ Weight: ____________________ Date: ________ Weight: ____________________ Date: ________ Weight: ____________________ Date: ________ Weight: ____________________ Date: ________ Weight: ____________________ Date: ________ Weight: ____________________ Date: ________ Weight: ____________________ Date: ________ Weight: ____________________ Date: ________ Weight: ____________________ This information is not intended to replace advice given to you by your health care provider. Make sure you discuss any questions you have with your health care provider. Document Revised: 03/17/2017 Document Reviewed: 03/17/2017 Elsevier Patient Education  2020 [employer].

## 2020-03-09 NOTE — Telephone Encounter (Signed)
Attempted to call the patient. No answer- voice mail box is full.  

## 2020-03-10 NOTE — Telephone Encounter (Signed)
Tried to call patient back, but was unable to reach. VM is full.

## 2020-03-13 NOTE — Telephone Encounter (Signed)
Results released to My Chart. No answer. Left detailed message with results, ok per DPR, and to call back if any questions.

## 2020-03-14 ENCOUNTER — Other Ambulatory Visit: Payer: Self-pay

## 2020-03-14 NOTE — Patient Outreach (Signed)
  Care Coordination- Social Work  03/14/2020  Jeremiah Vance December 21, 1970 [id number]  Subjective:    Jeremiah Vance is an 49 y.o. year old male who is a primary patient of Jeremiah Regal, NP.    [name] was given information about Medicaid Managed Care team care coordination services today.  SDOH:   SDOH Screenings   Alcohol Screen: Not on file  Depression (PHQ2-9): Low Risk   . PHQ-2 Score: 0  Financial Resource Strain: High Risk  . Difficulty of Paying Living Expenses: Hard  Food Insecurity: Not on file  Housing: Not on file  Physical Activity: Not on file  Social Connections: Not on file  Stress: Not on file  Tobacco Use: Low Risk   . Smoking Tobacco Use: Never Smoker  . Smokeless Tobacco Use: Never Used  Transportation Needs: Unmet Transportation Needs  . Lack of Transportation (Medical): Yes  . Lack of Transportation (Non-Medical): Yes     Objective:    Medications:  Medications Reviewed Today    Reviewed by De Hollingshead, RPH-CPP (Pharmacist) on 03/09/20 at 32  Med List Status: <None>  Medication Order Taking? Sig Documenting Provider Last Dose Status Informant  aspirin 81 MG chewable tablet [id number] Yes Chew 1 tablet (81 mg total) by mouth daily. Jeremiah Graff, FNP Taking Active Self  atorvastatin (LIPITOR) 40 MG tablet [id number] Yes Take 1 tablet (40 mg total) by mouth daily at 6 PM. Jeremiah Graff, FNP Taking Active Self  blood glucose meter kit and supplies [id number] Yes Dispense based on patient and insurance preference. Use up to four times daily as directed. (FOR ICD-10 E10.9, E11.9). Jeremiah Regal, NP Taking Active Self  carvedilol (COREG) 6.25 MG tablet [id number] Yes Take 4 tablets (25 mg total) by mouth 2 (two) times daily with a meal. Jeremiah Vance, Jeremiah Haber, PA-C Taking Active   empagliflozin (JARDIANCE) 10 MG TABS tablet [id number] No Take 1 tablet (10 mg total) by mouth daily before breakfast.  Patient not taking: Reported on 03/09/2020    Jeremiah Sells, MD Not Taking Active   losartan (COZAAR) 50 MG tablet [id number] Yes Take 2 tablets (100 mg total) by mouth daily. Jeremiah Mu, PA-C Taking Active   torsemide (DEMADEX) 20 MG tablet [id number] Yes Take 2 tablets (40 mg total) by mouth daily. Jeremiah Mu, PA-C Taking Active           Fall/Depression Screening:  Fall Risk  08/27/2019 08/09/2019 08/02/2019  Falls in the past year? 0 0 0  Number falls in past yr: 0 - -  Injury with Fall? 0 - -  Follow up Falls evaluation completed Falls evaluation completed Falls evaluation completed   PHQ 2/9 Scores 08/02/2019 06/03/2018 11/18/2017 10/14/2017 09/25/2017  PHQ - 2 Score 0 0 0 0 0  PHQ- 9 Score 0 - - - -    Assessment:  Patient was provided with information for getting his Family Planning Medicaid switched to a Managed Medicaid plan. BSW contacted the Managed Medicaid enrollement broker at [phone] and spoke with Elmyra Ricks. She stated that the patient would have to contact DSS first before getting a MM plan. BSW provided patient with this information. Patient stated he already has the phone number for [place].   Plan:  Follow-up: BSW will follow up in 45 days to ensure patient did contact DSS.

## 2020-03-14 NOTE — Telephone Encounter (Signed)
LMTCB to get scheduled with Claris Che ONE TIME ONLY for FMLA paperwork so we know how to fill out arcuately. Also advised in message that [facility] is where patient's were being referred to establish care.

## 2020-03-14 NOTE — Patient Instructions (Signed)
Thank you for speaking with me today regarding care management and care coordination needs. As we dicussed please contact the [facility] of Social Services [phone] to start the process on switching your Medicaid from  [facility] to Managed Medicaid.  If you have any questions please contact me at [phone].  Thank you  Gus Puma, BSW, [facility]  [place]  High Risk Managed Medicaid Team

## 2020-03-16 ENCOUNTER — Ambulatory Visit (INDEPENDENT_AMBULATORY_CARE_PROVIDER_SITE_OTHER): Payer: Self-pay | Admitting: Cardiology

## 2020-03-16 ENCOUNTER — Other Ambulatory Visit: Payer: Self-pay

## 2020-03-16 ENCOUNTER — Encounter: Payer: Self-pay | Admitting: Cardiology

## 2020-03-16 VITALS — BP 158/100 | HR 75 | Ht 70.0 in | Wt 378.0 lb

## 2020-03-16 DIAGNOSIS — I5030 Unspecified diastolic (congestive) heart failure: Secondary | ICD-10-CM

## 2020-03-16 DIAGNOSIS — I10 Essential (primary) hypertension: Secondary | ICD-10-CM

## 2020-03-16 MED ORDER — CARVEDILOL 25 MG PO TABS
25.0000 mg | ORAL_TABLET | Freq: Two times a day (BID) | ORAL | 3 refills | Status: DC
Start: 2020-03-16 — End: 2021-11-26

## 2020-03-16 MED ORDER — IRBESARTAN 300 MG PO TABS: 300.0000 mg | ORAL_TABLET | Freq: Every day | ORAL | 5 refills | Status: DC

## 2020-03-16 MED ORDER — IRBESARTAN 300 MG PO TABS: 300.0000 mg | ORAL_TABLET | Freq: Every day | ORAL | 3 refills | Status: DC

## 2020-03-16 MED ORDER — CARVEDILOL 25 MG PO TABS
25.0000 mg | ORAL_TABLET | Freq: Two times a day (BID) | ORAL | 5 refills | Status: DC
Start: 2020-03-16 — End: 2020-03-16

## 2020-03-16 NOTE — Progress Notes (Signed)
Cardiology Office Note:    Date:  03/16/2020   ID:  Jeremiah Vance, DOB [birth year], MRN [id number]  PCP:  [name], NP  Cardiologist:  Kate Sable, MD  Electrophysiologist:  None   Referring MD: [name], NP   Chief Complaint  Patient presents with  . Follow-up    1 week. "doing well."     History of Present Illness:    Jeremiah Vance is a 49 y.o. male with a hx of cardiomyopathy, HFpEF last EF 50 to 55%, hypertension, history of CVA, CKD stage III who presents for follow-up.  He was recently admitted due to heart failure, volume overload.  Diuresis appropriately, placed on torsemide on discharge.  His weight at home has been stable, has no edema.  Recently got a new blood pressure cuff, since his prior blood pressure cuff was not working well.  States doing okay, breathing is much better.   Past Medical History:  Diagnosis Date  . Acute ischemic stroke ([place]) 09/06/2017  . Acute renal failure superimposed on stage 3a chronic kidney disease ([place]) 07/08/2019  . Acute right-sided weakness   . Allergies   . Anasarca   . Anemia 07/10/2017  . Arthritis   . CHF (congestive heart failure) ([place])    "coinsided w/kidney problems I was having 06/2017"  . Chicken pox   . CKD (chronic kidney disease) stage 3, GFR 30-59 ml/min (HCC) 06/2017  . Depression   . Elevated troponin 07/08/2019  . High cholesterol   . History of cardiomyopathy    LVEF 40 to 45% in April 2019 - subsequently normalized  . Hyperbilirubinemia 07/10/2017  . Hypertension   . Ischemic stroke ([place])    Small left internal capsule infarct due to lacunar disease  . Morbid obesity ([place])   . Normocytic anemia 12/16/2017  . 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 Stroke ([place]) 08/2017   "right sided weakness since; getting stronger though" (10/22/2017)  . Type 2 diabetes mellitus ([place])     Past Surgical History:  Procedure Laterality Date  . ABDOMINAL HERNIA  REPAIR  2008; 10/22/2017   "scope; OPEN REPAIR INCARCERATED VENTRAL HERNIA  . HERNIA REPAIR    . KNEE ARTHROSCOPY Right 1989  . VENTRAL HERNIA REPAIR N/A 10/22/2017   Procedure: OPEN REPAIR INCARCERATED VENTRAL HERNIA;  Surgeon: Excell Seltzer, MD;  Location: [facility];  Service: General;  Laterality: N/A;    Current Medications: Current Meds  Medication Sig  . aspirin 81 MG chewable tablet Chew 1 tablet (81 mg total) by mouth daily.  [name] Kitchen atorvastatin (LIPITOR) 40 MG tablet Take 1 tablet (40 mg total) by mouth daily at 6 PM.  . blood glucose meter kit and supplies Dispense based on patient and insurance preference. Use up to four times daily as directed. (FOR ICD-10 E10.9, E11.9).  [name] Kitchen empagliflozin (JARDIANCE) 10 MG TABS tablet Take 1 tablet (10 mg total) by mouth daily before breakfast.  . insulin degludec (TRESIBA FLEXTOUCH) 200 UNIT/ML FlexTouch Pen Inject 10 Units into the skin daily.  [name] Kitchen torsemide (DEMADEX) 20 MG tablet Take 2 tablets (40 mg total) by mouth daily.  . [DISCONTINUED] carvedilol (COREG) 6.25 MG tablet Take 4 tablets (25 mg total) by mouth 2 (two) times daily with a meal.  . [DISCONTINUED] losartan (COZAAR) 50 MG tablet Take 2 tablets (100 mg total) by mouth daily.     Allergies:   Sulfa antibiotics and Pollen extract   Social History   Socioeconomic  History  . Marital status: Legally Separated    Spouse name: Not on file  . Number of children: Not on file  . Years of education: Not on file  . Highest education level: Not on file  Occupational History  . Not on file  Tobacco Use  . Smoking status: Never Smoker  . Smokeless tobacco: Never Used  Vaping Use  . Vaping Use: Never used  Substance and Sexual Activity  . Alcohol use: Not Currently  . Drug use: Never  . Sexual activity: Not Currently  Other Topics Concern  . Not on file  Social History Narrative   He lives with his sister , [name] and she is his MPOA after his stokes   Social Determinants of  Health   Financial Resource Strain: High Risk  . Difficulty of Paying Living Expenses: Hard  Food Insecurity: Not on file  Transportation Needs: Unmet Transportation Needs  . Lack of Transportation (Medical): Yes  . Lack of Transportation (Non-Medical): Yes  Physical Activity: Not on file  Stress: Not on file  Social Connections: Not on file     Family History: The patient's family history includes Arthritis in his father, mother, paternal grandfather, and paternal grandmother; Depression in his maternal grandmother, mother, and sister; Diabetes in his father, maternal grandmother, mother, sister, and sister; Hearing loss in his father and paternal grandmother; Heart attack in his father and paternal grandfather; Heart disease in his father, maternal grandmother, mother, and paternal grandfather; Hyperlipidemia in his father, maternal grandfather, and maternal grandmother; Hypertension in his father, maternal grandfather, maternal grandmother, mother, and sister; Learning disabilities in his mother; Mental illness in his mother and sister; Stroke in his father, maternal grandfather, maternal grandmother, mother, and paternal grandfather.  ROS:   Please see the history of present illness.     All other systems reviewed and are negative.  EKGs/Labs/Other Studies Reviewed:    The following studies were reviewed today:   EKG:  EKG is  ordered today.  The ekg ordered today demonstrates is bradycardia, heart rate 59.  Recent Labs: 02/19/2020: B Natriuretic Peptide 267.9 02/20/2020: Magnesium 2.0; TSH 1.065 02/26/2020: ALT 17; Hemoglobin 14.3; Platelets 259 03/09/2020: BUN 16; Creatinine, Ser 1.31; Potassium 4.4; Sodium 135  Recent Lipid Panel    Component Value Date/Time   CHOL 138 01/18/2020 1354   CHOL 115 06/17/2018 1455   TRIG 88.0 01/18/2020 1354   HDL 35.80 (L) 01/18/2020 1354   HDL 41 06/17/2018 1455   CHOLHDL 4 01/18/2020 1354   VLDL 17.6 01/18/2020 1354   LDLCALC 85  01/18/2020 1354   LDLCALC 53 06/17/2018 1455    Physical Exam:    VS:  BP (!) 158/100 (BP Location: Left Arm, Patient Position: Sitting, Cuff Size: Large)   Pulse 75   Ht '5\' 10"'  (1.778 m)   Wt (!) 378 lb (171.5 kg)   SpO2 96%   BMI 54.24 kg/m     Wt Readings from Last 3 Encounters:  03/16/20 (!) 378 lb (171.5 kg)  03/09/20 (!) 381 lb (172.8 kg)  03/08/20 (!) 379 lb 6 oz (172.1 kg)     GEN:  Well nourished, well developed in no acute distress HEENT: Normal NECK: No JVD; No carotid bruits LYMPHATICS: No lymphadenopathy CARDIAC: RRR, no murmurs, rubs, gallops RESPIRATORY:  Clear to auscultation without rales, wheezing or rhonchi  ABDOMEN: Soft, non-tender, non-distended MUSCULOSKELETAL: Trace edema; No deformity  SKIN: Warm and dry NEUROLOGIC:  Alert and oriented x 3 PSYCHIATRIC:  Normal affect  ASSESSMENT:    1. Heart failure with preserved ejection fraction, unspecified HF chronicity ([place])   2. Essential hypertension   3. Morbid obesity ([place])    PLAN:    In order of problems listed above:  1. heart failure preserved ejection fraction, last EF 50 to 09%, grade 2 diastolic dysfunction.  Etiology likely combination of hypertension and obesity.  Appears euvolemic.  Continue torsemide 40 mg daily.  Can take extra torsemide if weight gain above 3 pounds in a day or 5 pounds in a week. 2. Hypertension, BP elevated today, switch losartan to irbesartan 300 mg daily.  Consolidate Coreg to 25 mg twice daily.  Torsemide as above. 3. Patient is morbidly obese.  Low calorie diet/weight loss advised.    Follow-up in 3-6 months.   Medication Adjustments/Labs and Tests Ordered: Current medicines are reviewed at length with the patient today.  Concerns regarding medicines are outlined above.  No orders of the defined types were placed in this encounter.  Meds ordered this encounter  Medications  . DISCONTD: irbesartan (AVAPRO) 300 MG tablet    Sig: Take 1 tablet (300 mg total)  by mouth daily.    Dispense:  30 tablet    Refill:  5  . DISCONTD: carvedilol (COREG) 25 MG tablet    Sig: Take 1 tablet (25 mg total) by mouth 2 (two) times daily with a meal.    Dispense:  60 tablet    Refill:  5  . carvedilol (COREG) 25 MG tablet    Sig: Take 1 tablet (25 mg total) by mouth 2 (two) times daily with a meal. Start after you run out of Losartan.    Dispense:  180 tablet    Refill:  3  . irbesartan (AVAPRO) 300 MG tablet    Sig: Take 1 tablet (300 mg total) by mouth daily.    Dispense:  90 tablet    Refill:  3    Patient Instructions  Medication Instructions:   Your physician has recommended you make the following change in your medication:   1. Take your Losartan until you run out, then START taking irbesartan (AVAPRO) 300 MG one tablet by mouth once a day.  2.  Take Coreg 25 MG one tab by mouth two times a day.   *If you need a refill on your cardiac medications before your next appointment, please call your pharmacy*   Lab Work: None Ordered If you have labs (blood work) drawn today and your tests are completely normal, you will receive your results only by: [name] Kitchen MyChart Message (if you have MyChart) OR . A paper copy in the mail If you have any lab test that is abnormal or we need to change your treatment, we will call you to review the results.   Testing/Procedures: None Ordered   Follow-Up: At [facility], you and your health needs are our priority.  As part of our continuing mission to provide you with exceptional heart care, we have created designated Provider Care Teams.  These Care Teams include your primary Cardiologist (physician) and Advanced Practice Providers (APPs -  Physician Assistants and Nurse Practitioners) who all work together to provide you with the care you need, when you need it.  We recommend signing up for the patient portal called "MyChart".  Sign up information is provided on this After Visit Summary.  MyChart is used to  connect with patients for Virtual Visits (Telemedicine).  Patients are able to view lab/test results, encounter notes, upcoming  appointments, etc.  Non-urgent messages can be sent to your provider as well.   To learn more about what you can do with MyChart, go to [email or web address].    Your next appointment:   3-6 month(s)  The format for your next appointment:   In Person  Provider:   Kate Sable, MD   Other Instructions      Signed, Kate Sable, MD  03/16/2020 11:15 AM    [employer]

## 2020-03-16 NOTE — Patient Instructions (Signed)
Medication Instructions:   Your physician has recommended you make the following change in your medication:   1. Take your Losartan until you run out, then START taking irbesartan (AVAPRO) 300 MG one tablet by mouth once a day.  2.  Take Coreg 25 MG one tab by mouth two times a day.   *If you need a refill on your cardiac medications before your next appointment, please call your pharmacy*   Lab Work: None Ordered If you have labs (blood work) drawn today and your tests are completely normal, you will receive your results only by: [name] Kitchen MyChart Message (if you have MyChart) OR . A paper copy in the mail If you have any lab test that is abnormal or we need to change your treatment, we will call you to review the results.   Testing/Procedures: None Ordered   Follow-Up: At [facility], you and your health needs are our priority.  As part of our continuing mission to provide you with exceptional heart care, we have created designated Provider Care Teams.  These Care Teams include your primary Cardiologist (physician) and Advanced Practice Providers (APPs -  Physician Assistants and Nurse Practitioners) who all work together to provide you with the care you need, when you need it.  We recommend signing up for the patient portal called "MyChart".  Sign up information is provided on this After Visit Summary.  MyChart is used to connect with patients for Virtual Visits (Telemedicine).  Patients are able to view lab/test results, encounter notes, upcoming appointments, etc.  Non-urgent messages can be sent to your provider as well.   To learn more about what you can do with MyChart, go to [email or web address].    Your next appointment:   3-6 month(s)  The format for your next appointment:   In Person  Provider:   Debbe Odea, MD   Other Instructions

## 2020-03-17 ENCOUNTER — Ambulatory Visit (INDEPENDENT_AMBULATORY_CARE_PROVIDER_SITE_OTHER): Payer: Medicaid Other | Admitting: Vascular Surgery

## 2020-03-17 ENCOUNTER — Ambulatory Visit: Payer: Medicaid Other | Admitting: Family

## 2020-03-20 ENCOUNTER — Ambulatory Visit: Payer: Medicaid Other | Admitting: Pharmacist

## 2020-03-20 DIAGNOSIS — I5030 Unspecified diastolic (congestive) heart failure: Secondary | ICD-10-CM

## 2020-03-20 DIAGNOSIS — I5033 Acute on chronic diastolic (congestive) heart failure: Secondary | ICD-10-CM

## 2020-03-20 DIAGNOSIS — E785 Hyperlipidemia, unspecified: Secondary | ICD-10-CM

## 2020-03-20 DIAGNOSIS — N183 Chronic kidney disease, stage 3 unspecified: Secondary | ICD-10-CM

## 2020-03-20 DIAGNOSIS — E1165 Type 2 diabetes mellitus with hyperglycemia: Secondary | ICD-10-CM

## 2020-03-20 NOTE — Chronic Care Management (AMB) (Signed)
Care Management   Pharmacy Note  03/20/2020 Name: Jeremiah Vance MRN: [id number] DOB: Mar 16, 1971  Jeremiah Vance is a 49 y.o. year old male who is a primary care patient of Jeremiah Vance. Collaborating with covering provider Jeremiah Rumps, MD. The Care Management/Care Coordination team team was consulted for assistance with care management and care coordination needs.    Engaged with patient by telephone for follow up visit in response to provider referral for pharmacy case management and/or care coordination services.   Consent to Services:  [name] was given information about care management/care coordination services, agreed to services, and gave verbal consent prior to initiation of services. Please see initial visit note for detailed documentation.   Review of patient status, including review of consultants reports, laboratory and other test data, was performed as part of comprehensive evaluation and provision of chronic care management services.   SDOH (Social Determinants of Health) assessments and interventions performed:  SDOH Interventions   Flowsheet Row Most Recent Value  SDOH Interventions   Financial Strain Interventions Other (Comment)  [patient notes that he has a new plan taking effect, has not received card]       Objective:  Lab Results  Component Value Date   CREATININE 1.31 (H) 03/09/2020   CREATININE 1.68 (H) 02/26/2020   CREATININE 1.70 (H) 02/25/2020    Lab Results  Component Value Date   HGBA1C 12.0 (H) 02/20/2020       Component Value Date/Time   CHOL 138 01/18/2020 1354   CHOL 115 06/17/2018 1455   TRIG 88.0 01/18/2020 1354   HDL 35.80 (L) 01/18/2020 1354   HDL 41 06/17/2018 1455   CHOLHDL 4 01/18/2020 1354   VLDL 17.6 01/18/2020 1354   LDLCALC 85 01/18/2020 1354   LDLCALC 53 06/17/2018 1455     BP Readings from Last 3 Encounters:  03/16/20 (!) 158/100  03/09/20 (!) 170/100  03/08/20 (!) 186/94    Care  Plan  Allergies  Allergen Reactions   Sulfa Antibiotics    Pollen Extract Other (See Comments)    Medications Reviewed Today    Reviewed by Jeremiah Sable, MD (Physician) on 03/16/20 at [place] List Status: <None>  Medication Order Taking? Sig Documenting Provider Last Dose Status Informant  aspirin 81 MG chewable tablet [id number] Yes Chew 1 tablet (81 mg total) by mouth daily. Jeremiah Graff, FNP Taking Active Self  atorvastatin (LIPITOR) 40 MG tablet [id number] Yes Take 1 tablet (40 mg total) by mouth daily at 6 PM. Jeremiah Graff, FNP Taking Active Self  blood glucose meter kit and supplies [id number] Yes Dispense based on patient and insurance preference. Use up to four times daily as directed. (FOR ICD-10 E10.9, E11.9). Jeremiah Vance Taking Active Self  carvedilol (COREG) 25 MG tablet [id number]  Take 1 tablet (25 mg total) by mouth 2 (two) times daily with a meal. Start after you run out of Losartan. Jeremiah Sable, MD  Active   empagliflozin (JARDIANCE) 10 MG TABS tablet [id number] Yes Take 1 tablet (10 mg total) by mouth daily before breakfast. Jeremiah Sells, MD Taking Active   insulin degludec (TRESIBA FLEXTOUCH) 200 UNIT/ML FlexTouch Pen [id number] Yes Inject 10 Units into the skin daily. Jeremiah Haven, MD Taking Active   irbesartan (AVAPRO) 300 MG tablet [id number]  Take 1 tablet (300 mg total) by mouth daily. Jeremiah Sable, MD  Active   torsemide (DEMADEX) 20 MG tablet [id number] Yes Take 2 tablets (40 mg total)  by mouth daily. Jeremiah Mu, PA-C Taking Active           Patient Active Problem List   Diagnosis Date Noted   Cough    Other cirrhosis of liver ([place])    Ventral hernia without obstruction or gangrene    Acute CHF (congestive heart failure) ([place]) 02/20/2020   Shortness of breath    Hypertensive urgency    Acute respiratory failure with hypoxia (HCC)    Neck pain    Prostate cancer screening 01/21/2020   Lymphedema of  both lower extremities 10/17/2019   History of stroke 10/17/2019   CKD (chronic kidney disease) stage 3, GFR 30-59 ml/min (HCC) 08/31/2019   Swelling of limb 08/31/2019   Type 2 diabetes mellitus with hyperlipidemia ([place]) 07/08/2019   Acute kidney injury superimposed on CKD ([place]) 07/08/2019   Chest pain 07/08/2019   Vitreous floaters of both eyes 06/03/2018   Moderate obstructive sleep apnea 02/11/2018   Chronic fatigue 12/16/2017   Essential hypertension 12/16/2017   History of small bowel obstruction 10/24/2017   Adjustment disorder with depressed mood 09/30/2017   Stroke ([place]) 09/14/2017   Acute on chronic heart failure with preserved ejection fraction (HFpEF) (HCC)    Hyperlipidemia    Gout    Elevated LFTs    Heart failure with preserved ejection fraction ([place])    Morbid obesity ([place])    Hypoalbuminemia 07/11/2017   Type 2 diabetes mellitus with hyperglycemia ([place]) 07/10/2017    Conditions to be addressed/monitored per PCP order: CHF, CAD, HTN and DMII  Patient Care Plan: Medication Management  Completed 03/20/2020  Problem Identified: Diabetes, Heart Failure Resolved 03/20/2020    Long-Range Goal: Disease Progression Prevention Completed 03/20/2020  This Visit's Progress: Not on track  Recent Progress: On track  Priority: High  Note:   Current Barriers:   Unable to independently afford treatment regimen  Unable to independently monitor therapeutic efficacy  Unable to achieve control of diabetes, heart failure   Pharmacist Clinical Goal(s):   Over the next 90 days, patient will verbalize ability to afford treatment regimen.  achieve adherence to monitoring guidelines and medication adherence to achieve therapeutic efficacy. through collaboration with PharmD and provider.   Interventions:  Inter-disciplinary care team collaboration (see longitudinal plan of care)  Comprehensive medication review performed; medication list updated in  electronic medical record  Health Management:  Notes that he did call Social Services and they were unable to change his Family Planning Medicaid to Managed.   Notes that he has insurance through his job at [employer] that took effect in November. He has not received a card. Worry that it may not be in effect as no active prescription insurance noted in patient's chart.   Diabetes:  Uncontrolled; current treatment: Jardiance 10 mg daily, but notes he doesn't have any of this. Reports it is "on back order", but he has not returned paperwork to Medication Management to be enrolled in patient assistance. Prescribed Tresiba 10 units daily 2 weeks ago, but he has not come to clinic to pick up sample yet. o Previously on Trulicity, d/c at hospitalization. Patient notes GI upset with this.  o Hx metformin, notes that it "made him sick". Unsure if XR was tried  Current glucose readings: not checking.   Urged patient to come pick up Tresiba sample at his convenience.   Discussed to contact his HR department to follow up on not having received an insurance card yet.   Reviewed appt in ~3 weeks with  new PCP at [facility].   Heart Failure:  Uncontrolled; current treatment: losartan 100 mg daily (taking 2 50 mg tabs), carvedilol 25 mg daily (taking 4 6.25 mg tabs BID); torsemide 40 mg daily; follows w/ HF clinic and [place].   Medications continued at recent appt w/ Dr. Garen Lah.   Encouraged to weigh daily, check BP at home regularly. Low sodium diet. Discussed disease management education w/ CCM team at [facility].   Hyperlipidemia and ASCVD risk reduction:  Uncontrolled; current treatment: atorvastatin 40 mg daily   Antiplatelet regimen: aspirin 81 mg daily   Recommended to continue current regimen at this time, along with collaboration with cardiology . Due for repeat lipid panel moving forward    Patient Goals/Self-Care Activities  Over  the next 90 days, patient will:  - take medications as prescribed check blood glucose BID, document, and provide at future appointments check blood pressure daily, document, and provide at future appointments weigh daily, and contact provider if weight gain of >3 lbs/day, >5 lbs/week  Follow Up Plan: Patient to establish with new PCP       Medication Assistance: patient notes his insurance plan should be in effect  Plan: Patient transferring to new PCP and practice. Will collaborate w/ CCM team at that office for transition of care.   Catie Darnelle Maffucci, PharmD, [place], [place] Clinical Pharmacist [employer] at [place]

## 2020-03-20 NOTE — Patient Instructions (Signed)
Visit Information  Patient Care Plan: Medication Management  Completed 03/20/2020  Problem Identified: Diabetes, Heart Failure Resolved 03/20/2020    Long-Range Goal: Disease Progression Prevention Completed 03/20/2020  This Visit's Progress: Not on track  Recent Progress: On track  Priority: High  Note:   Current Barriers:  . Unable to independently afford treatment regimen . Unable to independently monitor therapeutic efficacy . Unable to achieve control of diabetes, heart failure   Pharmacist Clinical Goal(s):  [name] Kitchen Over the next 90 days, patient will verbalize ability to afford treatment regimen. [name] Kitchen achieve adherence to monitoring guidelines and medication adherence to achieve therapeutic efficacy. through collaboration with PharmD and provider.   Interventions: . Inter-disciplinary care team collaboration (see longitudinal plan of care) . Comprehensive medication review performed; medication list updated in electronic medical record  Health Management: . Notes that he did call Social Services and they were unable to change his Family Planning Medicaid to Managed.  . Notes that he has insurance through his job at [employer] that took effect in November. He has not received a card. Worry that it may not be in effect as no active prescription insurance noted in patient's chart.   Diabetes: . Uncontrolled; current treatment: Jardiance 10 mg daily, but notes he doesn't have any of this. Reports it is "on back order", but he has not returned paperwork to Medication Management to be enrolled in patient assistance. Prescribed Tresiba 10 units daily 2 weeks ago, but he has not come to clinic to pick up sample yet. o Previously on Trulicity, d/c at hospitalization. Patient notes GI upset with this.  o Hx metformin, notes that it "made him sick". Unsure if XR was tried . Current glucose readings: not checking.  Renae Gloss patient to come pick up Tresiba sample at his convenience.  . Discussed to  contact his HR department to follow up on not having received an insurance card yet.  . Reviewed appt in ~3 weeks with [facility] PCP at [facility].   Heart Failure: [name] Kitchen Uncontrolled; current treatment: losartan 100 mg daily (taking 2 50 mg tabs), carvedilol 25 mg daily (taking 4 6.25 mg tabs BID); torsemide 40 mg daily; follows w/ HF clinic and [facility].  . Medications continued at recent appt w/ Dr. Azucena Cecil.  . Encouraged to weigh daily, check BP at home regularly. Low sodium diet. Discussed disease management education w/ CCM team at [facility].   Hyperlipidemia and ASCVD risk reduction: . Uncontrolled; current treatment: atorvastatin 40 mg daily  . Antiplatelet regimen: aspirin 81 mg daily  . Recommended to continue current regimen at this time, along with collaboration with cardiology . Due for repeat lipid panel moving forward    Patient Goals/Self-Care Activities . Over the next 90 days, patient will:  - take medications as prescribed check blood glucose BID, document, and provide at future appointments check blood pressure daily, document, and provide at future appointments weigh daily, and contact provider if weight gain of >3 lbs/day, >5 lbs/week  Follow Up Plan: Patient to establish with new PCP        The patient verbalized understanding of instructions, educational materials, and care plan provided today and declined offer to receive copy of patient instructions, educational materials, and care plan.    Plan: Patient transferring to new PCP and practice. Will collaborate w/ CCM team at that office for transition of care.   Catie Feliz Beam, PharmD, [place], CPP Clinical Pharmacist [facility] at [employer] [phone]

## 2020-04-07 ENCOUNTER — Ambulatory Visit: Payer: Self-pay | Admitting: Adult Health

## 2020-05-05 ENCOUNTER — Other Ambulatory Visit: Payer: Self-pay

## 2020-05-05 NOTE — Patient Outreach (Signed)
Care Coordination  05/05/2020  Tryone D Stigler 1971-02-02 [id number]   Medicaid Managed Care   Unsuccessful Outreach Note  05/05/2020 Name: DENNIE MOLTZ MRN: [id number] DOB: 01-26-1971  Referred by: Theadore Nan, NP Reason for referral : High Risk Managed Medicaid (HR MM Unsuccessful Telephone Call)   An unsuccessful telephone outreach was attempted today. The patient was referred to the case management team for assistance with care management and care coordination.   Follow Up Plan: The patient has been provided with contact information for the care management team and has been advised to call with any health related questions or concerns.   Gus Puma, BSW, [facility]    High Risk Managed Medicaid Team

## 2020-05-05 NOTE — Patient Instructions (Signed)
Visit Information  [name]  - as a part of your Medicaid benefit, you are eligible for care management and care coordination services at no cost or copay. I was unable to reach you by phone today but would be happy to help you with your health related needs. Please feel free to call me @ [phone].    Gus Puma, BSW, [facility]  [place]  High Risk Managed Medicaid Team

## 2020-05-15 ENCOUNTER — Telehealth: Payer: Self-pay | Admitting: Pharmacy Technician

## 2020-05-15 NOTE — Telephone Encounter (Signed)
Patient failed to provide 2022 proof of income.  No additional medication assistance will be provided by [facility] without the required proof of income documentation.  Patient notified by letter.  Avion Patella J. Adel Burch Care Manager [facility] 

## 2020-05-15 NOTE — Telephone Encounter (Signed)
  Jeremiah Vance, [place]  [address]  May 15, 2020    Jeremiah Vance [address] [place], [place]  [address]  Dear Jeremiah Vance:  This is to inform you that you are no longer eligible to receive medication assistance at [facility].  The reason(s) are:    _____Your total gross monthly household income exceeds 250% of the Federal Poverty Level.   _____Tangible assets (savings, checking, stocks/bonds, pension, retirement, etc.) exceeds our limit  _____You are eligible to receive benefits from [facility], [facility] or HIV Medication              Assistance Program _____You are eligible to receive benefits from a Medicare Part "D" plan _____You have prescription insurance  _____You are not an [facility] resident __X__Failure to provide all requested proof of income information for 2022.    Medication assistance will resume once all requested financial information has been returned to our clinic.  If you have questions, please contact our clinic at [phone].    Thank you,  [facility]

## 2020-05-19 ENCOUNTER — Encounter: Payer: Self-pay | Admitting: Adult Health

## 2020-05-19 ENCOUNTER — Ambulatory Visit (INDEPENDENT_AMBULATORY_CARE_PROVIDER_SITE_OTHER): Payer: BC Managed Care – PPO | Admitting: Adult Health

## 2020-05-19 ENCOUNTER — Other Ambulatory Visit: Payer: Self-pay

## 2020-05-19 VITALS — BP 181/100 | HR 76 | Temp 98.2°F | Resp 16 | Ht 70.0 in | Wt 383.2 lb

## 2020-05-19 DIAGNOSIS — I5030 Unspecified diastolic (congestive) heart failure: Secondary | ICD-10-CM

## 2020-05-19 DIAGNOSIS — Z125 Encounter for screening for malignant neoplasm of prostate: Secondary | ICD-10-CM

## 2020-05-19 DIAGNOSIS — Z6841 Body Mass Index (BMI) 40.0 and over, adult: Secondary | ICD-10-CM

## 2020-05-19 DIAGNOSIS — I639 Cerebral infarction, unspecified: Secondary | ICD-10-CM

## 2020-05-19 DIAGNOSIS — I10 Essential (primary) hypertension: Secondary | ICD-10-CM

## 2020-05-19 DIAGNOSIS — E1165 Type 2 diabetes mellitus with hyperglycemia: Secondary | ICD-10-CM

## 2020-05-19 DIAGNOSIS — Z1389 Encounter for screening for other disorder: Secondary | ICD-10-CM | POA: Diagnosis not present

## 2020-05-19 DIAGNOSIS — I5031 Acute diastolic (congestive) heart failure: Secondary | ICD-10-CM

## 2020-05-19 LAB — POCT URINALYSIS DIPSTICK
Bilirubin, UA: NEGATIVE
Blood, UA: NEGATIVE
Glucose, UA: POSITIVE — AB
Ketones, UA: NEGATIVE
Leukocytes, UA: NEGATIVE
Nitrite, UA: NEGATIVE
Protein, UA: POSITIVE — AB
Spec Grav, UA: 1.015 (ref 1.010–1.025)
Urobilinogen, UA: 0.2 E.U./dL
pH, UA: 5 (ref 5.0–8.0)

## 2020-05-19 MED ORDER — AMLODIPINE BESYLATE 2.5 MG PO TABS: 2.5000 mg | ORAL_TABLET | Freq: Every day | ORAL | 0 refills | Status: DC

## 2020-05-19 NOTE — Progress Notes (Signed)
New patient visit   Patient: Jeremiah Vance   DOB: [birth year]   50 y.o. Male  MRN: [id number] Visit Date: 05/19/2020  Today's healthcare provider: Marcille Buffy, FNP   Chief Complaint  Patient presents with  . New Patient (Initial Visit)   Subjective    Jeremiah Vance is a 50 y.o. male who presents today as a new patient to establish care.  HPI  Patient presents in office to establish care he states that he feels well today and has no concerns to address.   Patient reports that his diet is poor and states that he is walking daily, on average patient reports that he sleeps 5-6hrs.   His dad died two days after thanksgiving and patient was in the hospital and he did not get to see him. Denies any suicidal or homicidal thoughts. He has some help with his sisters and they are all dealing with it.He declined need for medication or counseling.   He is legally blind left eye.  He sees podiatrists has arches for shoes. Needs diabetic foot exam and nail care.   He has insurance now and has Jardiance 52m  and was not taking because it was so expensive but he has a drug card and will try to use now that he has cPharmacist, community   His blood pressure is high in the office today he states "   He has history of stroke he has a neurologist but can not tell me his na,e.   Reviewed cardiology note 03/16/2020  as below: sees Dr. BAaron EdelmanAgbor-Etang  heart failure preserved ejection fraction, last EF 50 to 567% grade 2 diastolic dysfunction.  Etiology likely combination of hypertension and obesity.  Appears euvolemic.  Continue torsemide 40 mg daily.  Can take extra torsemide if weight gain above 3 pounds in a day or 5 pounds in a week. . torsemide (DEMADEX) 20 MG tablet Take 2 tablets (40 mg total) by mouth daily.  Hypertension, BP elevated today, switch losartan to irbesartan 300 mg daily.  Consolidate Coreg to 25 mg twice daily.  Torsemide as above.    Past Medical History:   Diagnosis Date  . Acute ischemic stroke ([facility] 09/06/2017  . Acute renal failure superimposed on stage 3a chronic kidney disease ([facility] 07/08/2019  . Acute right-sided weakness   . Allergies   . Anasarca   . Anemia 07/10/2017  . Arthritis   . CHF (congestive heart failure) ([facility]    "coinsided w/kidney problems I was having 06/2017"  . Chicken pox   . CKD (chronic kidney disease) stage 3, GFR 30-59 ml/min (HCC) 06/2017  . Depression   . Elevated troponin 07/08/2019  . High cholesterol   . History of cardiomyopathy    LVEF 40 to 45% in April 2019 - subsequently normalized  . Hyperbilirubinemia 07/10/2017  . Hypertension   . Ischemic stroke ([facility]    Small left internal capsule infarct due to lacunar disease  . Morbid obesity ([facility]   . Normocytic anemia 12/16/2017  . 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 Stroke ([facility] 08/2017   "right sided weakness since; getting stronger though" (10/22/2017)  . Type 2 diabetes mellitus ([facility]    Past Surgical History:  Procedure Laterality Date  . ABDOMINAL HERNIA REPAIR  2008; 10/22/2017   "scope; OPEN REPAIR INCARCERATED VENTRAL HERNIA  . HERNIA REPAIR    . KNEE ARTHROSCOPY Right 1989  . VENTRAL HERNIA REPAIR N/A 10/22/2017  Procedure: OPEN REPAIR INCARCERATED VENTRAL HERNIA;  Surgeon: Excell Seltzer, MD;  Location: [place];  Service: General;  Laterality: N/A;   Family Status  Relation Name Status  . Father  Alive  . Sister Optometrist  . Mother  Deceased  . MGM  Deceased  . PGF  Deceased  . MGF  Deceased  . PGM  Deceased  . Sister [name] Alive   Family History  Problem Relation Age of Onset  . Diabetes Father   . Heart disease Father   . Arthritis Father   . Hearing loss Father   . Hyperlipidemia Father   . Heart attack Father   . Hypertension Father   . Stroke Father   . Diabetes Sister   . Diabetes Mother   . Stroke Mother   . Arthritis Mother   . Depression Mother   . Heart  disease Mother   . Hypertension Mother   . Learning disabilities Mother   . Mental illness Mother   . Diabetes Maternal Grandmother   . Heart disease Maternal Grandmother   . Depression Maternal Grandmother   . Hyperlipidemia Maternal Grandmother   . Hypertension Maternal Grandmother   . Stroke Maternal Grandmother   . Stroke Paternal Grandfather   . Heart disease Paternal Grandfather   . Arthritis Paternal Grandfather   . Heart attack Paternal Grandfather   . Hypertension Maternal Grandfather   . Hyperlipidemia Maternal Grandfather   . Stroke Maternal Grandfather   . Arthritis Paternal Grandmother   . Hearing loss Paternal Grandmother   . Depression Sister   . Diabetes Sister   . Hypertension Sister   . Mental illness Sister    Social History   Socioeconomic History  . Marital status: Legally Separated    Spouse name: Not on file  . Number of children: Not on file  . Years of education: Not on file  . Highest education level: Not on file  Occupational History  . Not on file  Tobacco Use  . Smoking status: Never Smoker  . Smokeless tobacco: Never Used  Vaping Use  . Vaping Use: Never used  Substance and Sexual Activity  . Alcohol use: Not Currently  . Drug use: Never  . Sexual activity: Not Currently  Other Topics Concern  . Not on file  Social History Narrative   He lives with his sister , [name] and she is his MPOA after his stokes   Social Determinants of Health   Financial Resource Strain: High Risk  . Difficulty of Paying Living Expenses: Hard  Food Insecurity: Not on file  Transportation Needs: Unmet Transportation Needs  . Lack of Transportation (Medical): Yes  . Lack of Transportation (Non-Medical): Yes  Physical Activity: Not on file  Stress: Not on file  Social Connections: Not on file   Outpatient Medications Prior to Visit  Medication Sig  . aspirin 81 MG chewable tablet Chew 1 tablet (81 mg total) by mouth daily.  [name] Kitchen atorvastatin (LIPITOR)  40 MG tablet Take 1 tablet (40 mg total) by mouth daily at 6 PM.  . carvedilol (COREG) 25 MG tablet Take 1 tablet (25 mg total) by mouth 2 (two) times daily with a meal. Start after you run out of Losartan.  . empagliflozin (JARDIANCE) 10 MG TABS tablet Take 1 tablet (10 mg total) by mouth daily before breakfast. (Patient not taking: Reported on 03/20/2020)  . insulin degludec (TRESIBA FLEXTOUCH) 200 UNIT/ML FlexTouch Pen Inject 10 Units into the skin daily. (Patient not taking:  Reported on 03/20/2020)  . irbesartan (AVAPRO) 300 MG tablet Take 1 tablet (300 mg total) by mouth daily.  [name] Kitchen torsemide (DEMADEX) 20 MG tablet Take 2 tablets (40 mg total) by mouth daily.  . [DISCONTINUED] blood glucose meter kit and supplies Dispense based on patient and insurance preference. Use up to four times daily as directed. (FOR ICD-10 E10.9, E11.9).   No facility-administered medications prior to visit.   Allergies  Allergen Reactions  . Sulfa Antibiotics   . Pollen Extract Other (See Comments)    Immunization History  Administered Date(s) Administered  . Influenza,inj,Quad PF,6+ Mos 12/16/2017, 01/18/2020  . PFIZER(Purple Top)SARS-COV-2 Vaccination 06/19/2019, 07/14/2019  . Pneumococcal Polysaccharide-23 09/08/2017    Health Maintenance  Topic Date Due  . OPHTHALMOLOGY EXAM  Never done  . COLONOSCOPY (Pts 45-88yr Insurance coverage will need to be confirmed)  Never done  . FOOT EXAM  11/19/2018  . COVID-19 Vaccine (3 - Booster for Pfizer series) 01/13/2020  . TETANUS/TDAP  10/16/2020 (Originally 12/10/1988)  . HEMOGLOBIN A1C  08/19/2020  . INFLUENZA VACCINE  Completed  . PNEUMOCOCCAL POLYSACCHARIDE VACCINE AGE 19-64 HIGH RISK  Completed  . Hepatitis C Screening  Completed  . HIV Screening  Completed    Patient Care Team: Tayanna Talford, MKelby Aline FNP as PCP - General (Family Medicine) AKate Sable MD as PCP - Cardiology (Cardiology)  Review of Systems  Constitutional: Negative.   HENT:  Negative.   Respiratory: Positive for cough and shortness of breath. Negative for apnea, choking, chest tightness, wheezing and stridor.   Cardiovascular: Positive for leg swelling. Negative for chest pain and palpitations.  Gastrointestinal: Negative.   Genitourinary: Negative.   Musculoskeletal: Positive for arthralgias and myalgias.  Neurological: Negative.   Psychiatric/Behavioral: Positive for sleep disturbance.  All other systems reviewed and are negative.   Last CBC Lab Results  Component Value Date   WBC 9.1 05/19/2020   HGB 15.1 05/19/2020   HCT 47.1 05/19/2020   MCV 87 05/19/2020   MCH 27.8 05/19/2020   RDW 12.4 05/19/2020   PLT 280 091/47/8295  Last metabolic panel Lab Results  Component Value Date   GLUCOSE 308 (H) 05/19/2020   NA 137 05/19/2020   K 5.2 05/19/2020   CL 101 05/19/2020   CO2 24 05/19/2020   BUN 17 05/19/2020   CREATININE 1.32 (H) 05/19/2020   GFRNONAA 62 05/19/2020   GFRAA 72 05/19/2020   CALCIUM 8.8 05/19/2020   PROT 6.4 05/19/2020   ALBUMIN 3.6 (L) 05/19/2020   LABGLOB 2.8 05/19/2020   AGRATIO 1.3 05/19/2020   BILITOT 0.6 05/19/2020   ALKPHOS 108 05/19/2020   AST 15 05/19/2020   ALT 23 05/19/2020   ANIONGAP 5 03/09/2020   Last lipids Lab Results  Component Value Date   CHOL 154 05/19/2020   HDL 39 (L) 05/19/2020   LDLCALC 100 (H) 05/19/2020   TRIG 79 05/19/2020   CHOLHDL 3.9 05/19/2020   Last hemoglobin A1c Lab Results  Component Value Date   HGBA1C WILL FOLLOW 05/19/2020   Last thyroid functions Lab Results  Component Value Date   TSH 0.850 05/19/2020   Last vitamin D No results found for: 25OHVITD2, 25OHVITD3, VD25OH Last vitamin B12 and Folate Lab Results  Component Value Date   VITAMINB12 561 07/11/2017   FOLATE 13.6 07/11/2017      Objective    BP (!) 181/100   Pulse 76   Temp 98.2 F (36.8 C) (Oral)   Resp 16   Ht '5\' 10"'  (1.778  m)   Wt (!) 383 lb 3.2 oz (173.8 kg)   SpO2 100%   BMI 54.98 kg/m      Filed Weights   05/19/20 1040  Weight: (!) 383 lb 3.2 oz (173.8 kg)    Vitals with BMI 05/19/2020 05/19/2020 03/16/2020  Height - '5\' 10"'  '5\' 10"'   Weight - 383 lbs 3 oz 378 lbs  BMI - 09.47 09.62  Systolic 836 629 476  Diastolic 546 503 546  Pulse - 76 75   Physical Exam Vitals reviewed.  Constitutional:      General: He is not in acute distress.    Appearance: Normal appearance. He is obese. He is not ill-appearing, toxic-appearing or diaphoretic.  HENT:     Head: Normocephalic and atraumatic.     Right Ear: Tympanic membrane, ear canal and external ear normal. There is no impacted cerumen.     Left Ear: Tympanic membrane, ear canal and external ear normal. There is no impacted cerumen.     Nose: Nose normal. No congestion or rhinorrhea.     Mouth/Throat:     Mouth: Mucous membranes are moist.     Pharynx: Oropharynx is Vance. No oropharyngeal exudate or posterior oropharyngeal erythema.  Eyes:     General: No scleral icterus.       Right eye: No discharge.        Left eye: No discharge.     Conjunctiva/sclera: Conjunctivae normal.     Pupils: Pupils are equal, round, and reactive to light.  Neck:     Vascular: No carotid bruit.  Cardiovascular:     Rate and Rhythm: Normal rate and regular rhythm.     Pulses: Normal pulses.     Heart sounds: Normal heart sounds. No murmur heard. No gallop.   Pulmonary:     Effort: Pulmonary effort is normal. No respiratory distress.     Breath sounds: Normal breath sounds. No stridor. No wheezing, rhonchi or rales.  Chest:     Chest wall: No tenderness.  Abdominal:     General: There is no distension.     Palpations: Abdomen is soft.     Tenderness: There is no abdominal tenderness. There is no right CVA tenderness, left CVA tenderness or guarding.  Genitourinary:    Comments: Declines.  Musculoskeletal:        General: Normal range of motion.     Cervical back: Normal range of motion and neck supple. No rigidity or tenderness.   Lymphadenopathy:     Cervical: No cervical adenopathy.  Skin:    General: Skin is warm.     Findings: No rash.  Neurological:     Mental Status: He is oriented to person, place, and time.  Psychiatric:        Mood and Affect: Mood normal.        Behavior: Behavior normal.        Thought Content: Thought content normal.        Judgment: Judgment normal.     Depression Screen PHQ 2/9 Scores 05/19/2020 08/02/2019 06/03/2018 11/18/2017  PHQ - 2 Score 1 0 0 0  PHQ- 9 Score 4 0 - -   Results for orders placed or performed in visit on 05/19/20  Comprehensive metabolic panel  Result Value Ref Range   Glucose 308 (H) 65 - 99 mg/dL   BUN 17 6 - 24 mg/dL   Creatinine, Ser 1.32 (H) 0.76 - 1.27 mg/dL   GFR calc non Af Amer 62 >59  mL/min/1.73   GFR calc Af Amer 72 >59 mL/min/1.73   BUN/Creatinine Ratio 13 9 - 20   Sodium 137 134 - 144 mmol/L   Potassium 5.2 3.5 - 5.2 mmol/L   Chloride 101 96 - 106 mmol/L   CO2 24 20 - 29 mmol/L   Calcium 8.8 8.7 - 10.2 mg/dL   Total Protein 6.4 6.0 - 8.5 g/dL   Albumin 3.6 (L) 4.0 - 5.0 g/dL   Globulin, Total 2.8 1.5 - 4.5 g/dL   Albumin/Globulin Ratio 1.3 1.2 - 2.2   Bilirubin Total 0.6 0.0 - 1.2 mg/dL   Alkaline Phosphatase 108 44 - 121 IU/L   AST 15 0 - 40 IU/L   ALT 23 0 - 44 IU/L  CBC with Differential/Platelet  Result Value Ref Range   WBC 9.1 3.4 - 10.8 x10E3/uL   RBC 5.43 4.14 - 5.80 x10E6/uL   Hemoglobin 15.1 13.0 - 17.7 g/dL   Hematocrit 47.1 37.5 - 51.0 %   MCV 87 79 - 97 fL   MCH 27.8 26.6 - 33.0 pg   MCHC 32.1 31.5 - 35.7 g/dL   RDW 12.4 11.6 - 15.4 %   Platelets 280 150 - 450 x10E3/uL   Neutrophils 65 Not Estab. %   Lymphs 23 Not Estab. %   Monocytes 7 Not Estab. %   Eos 4 Not Estab. %   Basos 1 Not Estab. %   Neutrophils Absolute 6.0 1.4 - 7.0 x10E3/uL   Lymphocytes Absolute 2.1 0.7 - 3.1 x10E3/uL   Monocytes Absolute 0.6 0.1 - 0.9 x10E3/uL   EOS (ABSOLUTE) 0.3 0.0 - 0.4 x10E3/uL   Basophils Absolute 0.1 0.0 - 0.2 x10E3/uL    Immature Granulocytes 0 Not Estab. %   Immature Grans (Abs) 0.0 0.0 - 0.1 x10E3/uL  Lipid panel  Result Value Ref Range   Cholesterol, Total 154 100 - 199 mg/dL   Triglycerides 79 0 - 149 mg/dL   HDL 39 (L) >39 mg/dL   VLDL Cholesterol Cal 15 5 - 40 mg/dL   LDL Chol Calc (NIH) 100 (H) 0 - 99 mg/dL   Chol/HDL Ratio 3.9 0.0 - 5.0 ratio  TSH  Result Value Ref Range   TSH 0.850 0.450 - 4.500 uIU/mL  HgB A1c  Result Value Ref Range   Hgb A1c MFr Bld WILL FOLLOW    Est. average glucose Bld gHb Est-mCnc WILL FOLLOW   Brain natriuretic peptide  Result Value Ref Range   BNP 291.6 (H) 0.0 - 100.0 pg/mL  PSA  Result Value Ref Range   Prostate Specific Ag, Serum 0.3 0.0 - 4.0 ng/mL  POCT urinalysis dipstick  Result Value Ref Range   Color, UA yellow    Clarity, UA Vance    Glucose, UA Positive (A) Negative   Bilirubin, UA negative    Ketones, UA negative    Spec Grav, UA 1.015 1.010 - 1.025   Blood, UA negative    pH, UA 5.0 5.0 - 8.0   Protein, UA Positive (A) Negative   Urobilinogen, UA 0.2 0.2 or 1.0 E.U./dL   Nitrite, UA negative    Leukocytes, UA Negative Negative   Appearance     Odor      Assessment & Plan       1. BMI 50.0-59.9, adult [facility]) The patient is advised to begin progressive daily aerobic exercise program, follow a low fat, low cholesterol diet, attempt to lose weight, decrease or avoid alcohol intake, reduce salt in diet and cooking,  reduce exposure to stress, improve dietary compliance, continue current medications, continue current healthy lifestyle patterns and return for routine annual checkups.  2. Heart failure with preserved ejection fraction, unspecified HF chronicity ([place]) Seeing cardiology denies any worsening symptoms.  - Brain natriuretic peptide  3. Type 2 diabetes mellitus with hyperglycemia, without long-term current use of insulin (HCC) Continue current medications, check labs today or when fasting.  - Comprehensive metabolic panel - CBC  with Differential/Platelet - Lipid panel - TSH - Ambulatory referral to Podiatry - HgB A1c  4. Cerebrovascular accident (CVA), unspecified mechanism [facility]) Needs neurology not seeing anyone and history of CVA.  - Ambulatory referral to Neurology  5. Screening for blood or protein in urine  - POCT urinalysis dipstick  6. Essential hypertension  Will add Norvasc, he is overdue for follow up with cardiology and is advised he should call them to follow up as well. Recheck of blood pressure advised in 3 weeks.  Meds ordered this encounter  Medications  . amLODipine (NORVASC) 2.5 MG tablet    Sig: Take 1 tablet (2.5 mg total) by mouth daily.    Dispense:  90 tablet    Refill:  0   Diet and lifestyle changes advised, and weight loss.  - DG Chest 2 View - PSA- screening for prostate cancer.    Red Flags discussed. The patient was given Vance instructions to go to ER or return to medical center if any red flags develop, symptoms do not improve, worsen or new problems develop. They verbalized understanding.  Return in about 3 weeks (around 06/09/2020), or if symptoms worsen or fail to improve, for at any time for any worsening symptoms, Go to Emergency room/ urgent care if worse.     The entirety of the information documented in the History of Present Illness, Review of Systems and Physical Exam were personally obtained by me. Portions of this information were initially documented by the CMA and reviewed by me for thoroughness and accuracy.      Marcille Buffy, [place] [phone] (phone) [phone] (fax)  [place]

## 2020-05-19 NOTE — Progress Notes (Signed)
Positive protein and glucose, labs ordered, microalbumin as well ordered for lab.

## 2020-05-19 NOTE — Patient Instructions (Addendum)
Heart Failure, Diagnosis  Heart failure means that your heart is not able to pump blood in the right way. This makes it hard for your body to work well. Heart failure is usually a long-term (chronic) condition. You must take good care of yourself and follow your treatment plan from your doctor. What are the causes?  High blood pressure.  Buildup of cholesterol and fat in the arteries.  Heart attack. This injures the heart muscle.  Heart valves that do not open and close properly.  Damage of the heart muscle. This is also called cardiomyopathy.  Infection of the heart muscle. This is also called myocarditis.  Lung disease. What increases the risk?  Getting older. The risk of heart failure goes up as a person ages.  Being overweight.  Being male.  Use tobacco or nicotine products.  Abusing alcohol or drugs.  Having taken medicines that can damage the heart.  Having any of these conditions: ? Diabetes. ? Abnormal heart rhythms. ? Thyroid problems. ? Low blood counts (anemia).  Having a family history of heart failure. What are the signs or symptoms?  Shortness of breath.  Coughing.  Swelling of the feet, ankles, legs, or belly.  Losing or gaining weight for no reason.  Trouble breathing.  Waking from sleep because of the need to sit up and get more air.  Fast heartbeat.  Being very tired.  Feeling dizzy, or feeling like you may pass out (faint).  Having no desire to eat.  Feeling like you may vomit (nauseous).  Peeing (urinating) more at night.  Feeling confused. How is this treated? This condition may be treated with:  Medicines. These can be given to treat blood pressure and to make the heart muscles stronger.  Changes in your daily life. These may include: ? Eating a healthy diet. ? Staying at a healthy body weight. ? Quitting tobacco, alcohol, and drug use. ? Doing exercises. ? Participating in a cardiac rehabilitation program.  This program helps you improve your health through exercise, education, and counseling.  Surgery. Surgery can be done to open blocked valves, or to put devices in the heart, such as pacemakers.  A donor heart (heart transplant). You will receive a healthy heart from a donor. Follow these instructions at home:  Treat other conditions as told by your doctor. These may include high blood pressure, diabetes, thyroid disease, or abnormal heart rhythms.  Learn as much as you can about heart failure.  Get support as you need it.  Keep all follow-up visits. Summary  Heart failure means that your heart is not able to pump blood in the right way.  This condition is often caused by high blood pressure, heart attack, or damage of the heart muscle.  Symptoms of this condition include shortness of breath and swelling of the feet, ankles, legs, or belly. You may also feel very tired or feel like you may vomit.  You may be treated with medicines, surgery, or changes in your daily life.  Treat other health conditions as told by your doctor. This information is not intended to replace advice given to you by your health care provider. Make sure you discuss any questions you have with your health care provider. Document Revised: 10/09/2019 Document Reviewed: 10/09/2019 Elsevier Patient Education  2021 [employer]  Hypertension, Adult Hypertension is another name for high blood pressure. High blood pressure forces your heart to work harder to pump blood. This can cause problems over time. There  are two numbers in a blood pressure reading. There is a top number (systolic) over a bottom number (diastolic). It is best to have a blood pressure that is below 120/80. Healthy choices can help lower your blood pressure, or you may need medicine to help lower it. What are the causes? The cause of this condition is not known. Some conditions may be related to high blood pressure. What increases the  risk?  Smoking.  Having type 2 diabetes mellitus, high cholesterol, or both.  Not getting enough exercise or physical activity.  Being overweight.  Having too much fat, sugar, calories, or salt (sodium) in your diet.  Drinking too much alcohol.  Having long-term (chronic) kidney disease.  Having a family history of high blood pressure.  Age. Risk increases with age.  Race. You may be at higher risk if you are African American.  Gender. Men are at higher risk than women before age 62. After age 19, women are at higher risk than men.  Having obstructive sleep apnea.  Stress. What are the signs or symptoms?  High blood pressure may not cause symptoms. Very high blood pressure (hypertensive crisis) may cause: ? Headache. ? Feelings of worry or nervousness (anxiety). ? Shortness of breath. ? Nosebleed. ? A feeling of being sick to your stomach (nausea). ? Throwing up (vomiting). ? Changes in how you see. ? Very bad chest pain. ? Seizures. How is this treated?  This condition is treated by making healthy lifestyle changes, such as: ? Eating healthy foods. ? Exercising more. ? Drinking less alcohol.  Your health care provider may prescribe medicine if lifestyle changes are not enough to get your blood pressure under control, and if: ? Your top number is above 130. ? Your bottom number is above 80.  Your personal target blood pressure may vary. Follow these instructions at home: Eating and drinking  If told, follow the DASH eating plan. To follow this plan: ? Fill one half of your plate at each meal with fruits and vegetables. ? Fill one fourth of your plate at each meal with whole grains. Whole grains include whole-wheat pasta, brown rice, and whole-grain bread. ? Eat or drink low-fat dairy products, such as skim milk or low-fat yogurt. ? Fill one fourth of your plate at each meal with low-fat (lean) proteins. Low-fat proteins include fish, chicken without skin,  eggs, beans, and tofu. ? Avoid fatty meat, cured and processed meat, or chicken with skin. ? Avoid pre-made or processed food.  Eat less than 1,500 mg of salt each day.  Do not drink alcohol if: ? Your doctor tells you not to drink. ? You are pregnant, may be pregnant, or are planning to become pregnant.  If you drink alcohol: ? Limit how much you use to:  0-1 drink a day for women.  0-2 drinks a day for men. ? Be aware of how much alcohol is in your drink. In the [place] one drink equals one 12 oz bottle of beer (355 mL), one 5 oz glass of wine (148 mL), or one 1 oz glass of hard liquor (44 mL).   Lifestyle  Work with your doctor to stay at a healthy weight or to lose weight. Ask your doctor what the best weight is for you.  Get at least 30 minutes of exercise most days of the week. This may include walking, swimming, or biking.  Get at least 30 minutes of exercise that strengthens your muscles (resistance exercise) at least 3  days a week. This may include lifting weights or doing Pilates.  Do not use any products that contain nicotine or tobacco, such as cigarettes, e-cigarettes, and chewing tobacco. If you need help quitting, ask your doctor.  Check your blood pressure at home as told by your doctor.  Keep all follow-up visits as told by your doctor. This is important.   Medicines  Take over-the-counter and prescription medicines only as told by your doctor. Follow directions carefully.  Do not skip doses of blood pressure medicine. The medicine does not work as well if you skip doses. Skipping doses also puts you at risk for problems.  Ask your doctor about side effects or reactions to medicines that you should watch for. Contact a doctor if you:  Think you are having a reaction to the medicine you are taking.  Have headaches that keep coming back (recurring).  Feel dizzy.  Have swelling in your ankles.  Have trouble with your vision. Get help right away if  you:  Get a very bad headache.  Start to feel mixed up (confused).  Feel weak or numb.  Feel faint.  Have very bad pain in your: ? Chest. ? Belly (abdomen).  Throw up more than once.  Have trouble breathing. Summary  Hypertension is another name for high blood pressure.  High blood pressure forces your heart to work harder to pump blood.  For most people, a normal blood pressure is less than 120/80.  Making healthy choices can help lower blood pressure. If your blood pressure does not get lower with healthy choices, you may need to take medicine. This information is not intended to replace advice given to you by your health care provider. Make sure you discuss any questions you have with your health care provider. Document Revised: 11/26/2017 Document Reviewed: 11/26/2017 Elsevier Patient Education  2021 [employer] Inc. Amlodipine Tablets What is this medicine? AMLODIPINE (am LOE di peen) is a calcium channel blocker. It relaxes your blood vessels and decreases the amount of work the heart has to do. It treats high blood pressure and/or prevents chest pain (also called angina). This medicine may be used for other purposes; ask your health care provider or pharmacist if you have questions. COMMON BRAND NAME(S): Norvasc What should I tell my health care provider before I take this medicine? They need to know if you have any of these conditions:  heart disease  liver disease  an unusual or allergic reaction to amlodipine, other drugs, foods, dyes, or preservatives  pregnant or trying to get pregnant  breast-feeding How should I use this medicine? Take this medicine by mouth. Take it as directed on the prescription label at the same time every day. You can take it with or without food. If it upsets your stomach, take it with food. Keep taking it unless your health care provider tells you to stop. Talk to your health care provider about the use of this medicine in children.  While it may be prescribed for children as young as 6 for selected conditions, precautions do apply. Overdosage: If you think you have taken too much of this medicine contact a poison control center or emergency room at once. NOTE: This medicine is only for you. Do not share this medicine with others. What if I miss a dose? If you miss a dose, take it as soon as you can. If it is almost time for your next dose, take only that dose. Do not take double or extra doses. What  may interact with this medicine? This medicine may interact with the following medications:  clarithromycin  cyclosporine  diltiazem  itraconazole  simvastatin  tacrolimus This list may not describe all possible interactions. Give your health care provider a list of all the medicines, herbs, non-prescription drugs, or dietary supplements you use. Also tell them if you smoke, drink alcohol, or use illegal drugs. Some items may interact with your medicine. What should I watch for while using this medicine? Visit your health care provider for regular checks on your progress. Check your blood pressure as directed. Ask your health care provider what your blood pressure should be. Also, find out when you should contact him or her. Do not treat yourself for coughs, colds, or pain while you are using this medicine without asking your health care provider for advice. Some medicines may increase your blood pressure. You may get drowsy or dizzy. Do not drive, use machinery, or do anything that needs mental alertness until you know how this medicine affects you. Do not stand up or sit up quickly, especially if you are an older patient. This reduces the risk of dizzy or fainting spells. Alcohol can make you more drowsy and dizzy. Avoid alcoholic drinks. What side effects may I notice from receiving this medicine? Side effects that you should report to your doctor or health care provider as soon as possible:  allergic reactions (skin  rash, itching or hives; swelling of the face, lips, or tongue)  heart attack (trouble breathing; pain or tightness in the chest, neck, back or arms; unusually weak or tired)  low blood pressure (dizziness; feeling faint or lightheaded, falls; unusually weak or tired) Side effects that usually do not require medical attention (report these to your doctor or health care provider if they continue or are bothersome):  facial flushing  nausea  palpitations  stomach pain  sudden weight gain  swelling of the ankles, feet, hands This list may not describe all possible side effects. Call your doctor for medical advice about side effects. You may report side effects to FDA at 1-800-FDA-1088. Where should I keep my medicine? Keep out of the reach of children and pets. Store at room temperature between 20 and 25 degrees C (68 and 77 degrees F). Protect from light and moisture. Keep the container tightly closed. Get rid of any unused medicine after the expiration date. To get rid of medicines that are no longer needed or have expired:  Take the medicine to a medicine take-back program. Check with your pharmacy or law enforcement to find a location.  If you cannot return the medicine, check the label or package insert to see if the medicine should be thrown out in the garbage or flushed down the toilet. If you are not sure, ask your health care provider. If it is safe to put in the trash, empty the medicine out of the container. Mix the medicine with cat litter, dirt, coffee grounds, or other unwanted substance. Seal the mixture in a bag or container. Put it in the trash. NOTE: This sheet is a summary. It may not cover all possible information. If you have questions about this medicine, talk to your doctor, pharmacist, or health care provider.  2021 Elsevier/Gold Standard (2020-02-12 14:59:47) Hypertension, Adult Hypertension is another name for high blood pressure. High blood pressure forces your  heart to work harder to pump blood. This can cause problems over time. There are two numbers in a blood pressure reading. There is a top number (  systolic) over a bottom number (diastolic). It is best to have a blood pressure that is below 120/80. Healthy choices can help lower your blood pressure, or you may need medicine to help lower it. What are the causes? The cause of this condition is not known. Some conditions may be related to high blood pressure. What increases the risk?  Smoking.  Having type 2 diabetes mellitus, high cholesterol, or both.  Not getting enough exercise or physical activity.  Being overweight.  Having too much fat, sugar, calories, or salt (sodium) in your diet.  Drinking too much alcohol.  Having long-term (chronic) kidney disease.  Having a family history of high blood pressure.  Age. Risk increases with age.  Race. You may be at higher risk if you are African American.  Gender. Men are at higher risk than women before age 38. After age 51, women are at higher risk than men.  Having obstructive sleep apnea.  Stress. What are the signs or symptoms?  High blood pressure may not cause symptoms. Very high blood pressure (hypertensive crisis) may cause: ? Headache. ? Feelings of worry or nervousness (anxiety). ? Shortness of breath. ? Nosebleed. ? A feeling of being sick to your stomach (nausea). ? Throwing up (vomiting). ? Changes in how you see. ? Very bad chest pain. ? Seizures. How is this treated?  This condition is treated by making healthy lifestyle changes, such as: ? Eating healthy foods. ? Exercising more. ? Drinking less alcohol.  Your health care provider may prescribe medicine if lifestyle changes are not enough to get your blood pressure under control, and if: ? Your top number is above 130. ? Your bottom number is above 80.  Your personal target blood pressure may vary. Follow these instructions at home: Eating and  drinking  If told, follow the DASH eating plan. To follow this plan: ? Fill one half of your plate at each meal with fruits and vegetables. ? Fill one fourth of your plate at each meal with whole grains. Whole grains include whole-wheat pasta, brown rice, and whole-grain bread. ? Eat or drink low-fat dairy products, such as skim milk or low-fat yogurt. ? Fill one fourth of your plate at each meal with low-fat (lean) proteins. Low-fat proteins include fish, chicken without skin, eggs, beans, and tofu. ? Avoid fatty meat, cured and processed meat, or chicken with skin. ? Avoid pre-made or processed food.  Eat less than 1,500 mg of salt each day.  Do not drink alcohol if: ? Your doctor tells you not to drink. ? You are pregnant, may be pregnant, or are planning to become pregnant.  If you drink alcohol: ? Limit how much you use to:  0-1 drink a day for women.  0-2 drinks a day for men. ? Be aware of how much alcohol is in your drink. In the [place] one drink equals one 12 oz bottle of beer (355 mL), one 5 oz glass of wine (148 mL), or one 1 oz glass of hard liquor (44 mL).   Lifestyle  Work with your doctor to stay at a healthy weight or to lose weight. Ask your doctor what the best weight is for you.  Get at least 30 minutes of exercise most days of the week. This may include walking, swimming, or biking.  Get at least 30 minutes of exercise that strengthens your muscles (resistance exercise) at least 3 days a week. This may include lifting weights or doing Pilates.  Do not  use any products that contain nicotine or tobacco, such as cigarettes, e-cigarettes, and chewing tobacco. If you need help quitting, ask your doctor.  Check your blood pressure at home as told by your doctor.  Keep all follow-up visits as told by your doctor. This is important.   Medicines  Take over-the-counter and prescription medicines only as told by your doctor. Follow directions carefully.  Do not skip  doses of blood pressure medicine. The medicine does not work as well if you skip doses. Skipping doses also puts you at risk for problems.  Ask your doctor about side effects or reactions to medicines that you should watch for. Contact a doctor if you:  Think you are having a reaction to the medicine you are taking.  Have headaches that keep coming back (recurring).  Feel dizzy.  Have swelling in your ankles.  Have trouble with your vision. Get help right away if you:  Get a very bad headache.  Start to feel mixed up (confused).  Feel weak or numb.  Feel faint.  Have very bad pain in your: ? Chest. ? Belly (abdomen).  Throw up more than once.  Have trouble breathing. Summary  Hypertension is another name for high blood pressure.  High blood pressure forces your heart to work harder to pump blood.  For most people, a normal blood pressure is less than 120/80.  Making healthy choices can help lower blood pressure. If your blood pressure does not get lower with healthy choices, you may need to take medicine. This information is not intended to replace advice given to you by your health care provider. Make sure you discuss any questions you have with your health care provider. Document Revised: 11/26/2017 Document Reviewed: 11/26/2017 Elsevier Patient Education  2021 [employer] Inc. Amlodipine Tablets What is this medicine? AMLODIPINE (am LOE di peen) is a calcium channel blocker. It relaxes your blood vessels and decreases the amount of work the heart has to do. It treats high blood pressure and/or prevents chest pain (also called angina). This medicine may be used for other purposes; ask your health care provider or pharmacist if you have questions. COMMON BRAND NAME(S): Norvasc What should I tell my health care provider before I take this medicine? They need to know if you have any of these conditions:  heart disease  liver disease  an unusual or allergic reaction  to amlodipine, other drugs, foods, dyes, or preservatives  pregnant or trying to get pregnant  breast-feeding How should I use this medicine? Take this medicine by mouth. Take it as directed on the prescription label at the same time every day. You can take it with or without food. If it upsets your stomach, take it with food. Keep taking it unless your health care provider tells you to stop. Talk to your health care provider about the use of this medicine in children. While it may be prescribed for children as young as 6 for selected conditions, precautions do apply. Overdosage: If you think you have taken too much of this medicine contact a poison control center or emergency room at once. NOTE: This medicine is only for you. Do not share this medicine with others. What if I miss a dose? If you miss a dose, take it as soon as you can. If it is almost time for your next dose, take only that dose. Do not take double or extra doses. What may interact with this medicine? This medicine may interact with the following medications:  clarithromycin  cyclosporine  diltiazem  itraconazole  simvastatin  tacrolimus This list may not describe all possible interactions. Give your health care provider a list of all the medicines, herbs, non-prescription drugs, or dietary supplements you use. Also tell them if you smoke, drink alcohol, or use illegal drugs. Some items may interact with your medicine. What should I watch for while using this medicine? Visit your health care provider for regular checks on your progress. Check your blood pressure as directed. Ask your health care provider what your blood pressure should be. Also, find out when you should contact him or her. Do not treat yourself for coughs, colds, or pain while you are using this medicine without asking your health care provider for advice. Some medicines may increase your blood pressure. You may get drowsy or dizzy. Do not drive, use  machinery, or do anything that needs mental alertness until you know how this medicine affects you. Do not stand up or sit up quickly, especially if you are an older patient. This reduces the risk of dizzy or fainting spells. Alcohol can make you more drowsy and dizzy. Avoid alcoholic drinks. What side effects may I notice from receiving this medicine? Side effects that you should report to your doctor or health care provider as soon as possible:  allergic reactions (skin rash, itching or hives; swelling of the face, lips, or tongue)  heart attack (trouble breathing; pain or tightness in the chest, neck, back or arms; unusually weak or tired)  low blood pressure (dizziness; feeling faint or lightheaded, falls; unusually weak or tired) Side effects that usually do not require medical attention (report these to your doctor or health care provider if they continue or are bothersome):  facial flushing  nausea  palpitations  stomach pain  sudden weight gain  swelling of the ankles, feet, hands This list may not describe all possible side effects. Call your doctor for medical advice about side effects. You may report side effects to FDA at 1-800-FDA-1088. Where should I keep my medicine? Keep out of the reach of children and pets. Store at room temperature between 20 and 25 degrees C (68 and 77 degrees F). Protect from light and moisture. Keep the container tightly closed. Get rid of any unused medicine after the expiration date. To get rid of medicines that are no longer needed or have expired:  Take the medicine to a medicine take-back program. Check with your pharmacy or law enforcement to find a location.  If you cannot return the medicine, check the label or package insert to see if the medicine should be thrown out in the garbage or flushed down the toilet. If you are not sure, ask your health care provider. If it is safe to put in the trash, empty the medicine out of the container. Mix  the medicine with cat litter, dirt, coffee grounds, or other unwanted substance. Seal the mixture in a bag or container. Put it in the trash. NOTE: This sheet is a summary. It may not cover all possible information. If you have questions about this medicine, talk to your doctor, pharmacist, or health care provider.  2021 [employer]vier/Gold Standard (2020-02-12 14:59:47) Health Maintenance, Male Adopting a healthy lifestyle and getting preventive care are important in promoting health and wellness. Ask your health care provider about:  The right schedule for you to have regular tests and exams.  Things you can do on your own to prevent diseases and keep yourself healthy. What should I know about  diet, weight, and exercise? Eat a healthy diet  Eat a diet that includes plenty of vegetables, fruits, low-fat dairy products, and lean protein.  Do not eat a lot of foods that are high in solid fats, added sugars, or sodium.   Maintain a healthy weight Body mass index (BMI) is a measurement that can be used to identify possible weight problems. It estimates body fat based on height and weight. Your health care provider can help determine your BMI and help you achieve or maintain a healthy weight. Get regular exercise Get regular exercise. This is one of the most important things you can do for your health. Most adults should:  Exercise for at least 150 minutes each week. The exercise should increase your heart rate and make you sweat (moderate-intensity exercise).  Do strengthening exercises at least twice a week. This is in addition to the moderate-intensity exercise.  Spend less time sitting. Even light physical activity can be beneficial. Watch cholesterol and blood lipids Have your blood tested for lipids and cholesterol at 51 years of age, then have this test every 5 years. You may need to have your cholesterol levels checked more often if:  Your lipid or cholesterol levels are high.  You are  older than 51 years of age.  You are at high risk for heart disease. What should I know about cancer screening? Many types of cancers can be detected early and may often be prevented. Depending on your health history and family history, you may need to have cancer screening at various ages. This may include screening for:  Colorectal cancer.  Prostate cancer.  Skin cancer.  Lung cancer. What should I know about heart disease, diabetes, and high blood pressure? Blood pressure and heart disease  High blood pressure causes heart disease and increases the risk of stroke. This is more likely to develop in people who have high blood pressure readings, are of African descent, or are overweight.  Talk with your health care provider about your target blood pressure readings.  Have your blood pressure checked: ? Every 3-5 years if you are 29-78 years of age. ? Every year if you are 58 years old or older.  If you are between the ages of 65 and 59 and are a current or former smoker, ask your health care provider if you should have a one-time screening for abdominal aortic aneurysm (AAA). Diabetes Have regular diabetes screenings. This checks your fasting blood sugar level. Have the screening done:  Once every three years after age 80 if you are at a normal weight and have a low risk for diabetes.  More often and at a younger age if you are overweight or have a high risk for diabetes. What should I know about preventing infection? Hepatitis B If you have a higher risk for hepatitis B, you should be screened for this virus. Talk with your health care provider to find out if you are at risk for hepatitis B infection. Hepatitis C Blood testing is recommended for:  Everyone born from [birth year] through 1966.  Anyone with known risk factors for hepatitis C. Sexually transmitted infections (STIs)  You should be screened each year for STIs, including gonorrhea and chlamydia, if: ? You are sexually  active and are younger than 51 years of age. ? You are older than 51 years of age and your health care provider tells you that you are at risk for this type of infection. ? Your sexual activity has changed since you  were last screened, and you are at increased risk for chlamydia or gonorrhea. Ask your health care provider if you are at risk.  Ask your health care provider about whether you are at high risk for HIV. Your health care provider may recommend a prescription medicine to help prevent HIV infection. If you choose to take medicine to prevent HIV, you should first get tested for HIV. You should then be tested every 3 months for as long as you are taking the medicine. Follow these instructions at home: Lifestyle  Do not use any products that contain nicotine or tobacco, such as cigarettes, e-cigarettes, and chewing tobacco. If you need help quitting, ask your health care provider.  Do not use street drugs.  Do not share needles.  Ask your health care provider for help if you need support or information about quitting drugs. Alcohol use  Do not drink alcohol if your health care provider tells you not to drink.  If you drink alcohol: ? Limit how much you have to 0-2 drinks a day. ? Be aware of how much alcohol is in your drink. In the [place] one drink equals one 12 oz bottle of beer (355 mL), one 5 oz glass of wine (148 mL), or one 1 oz glass of hard liquor (44 mL). General instructions  Schedule regular health, dental, and eye exams.  Stay current with your vaccines.  Tell your health care provider if: ? You often feel depressed. ? You have ever been abused or do not feel safe at home. Summary  Adopting a healthy lifestyle and getting preventive care are important in promoting health and wellness.  Follow your health care provider's instructions about healthy diet, exercising, and getting tested or screened for diseases.  Follow your health care provider's instructions on  monitoring your cholesterol and blood pressure. This information is not intended to replace advice given to you by your health care provider. Make sure you discuss any questions you have with your health care provider. Document Revised: 03/11/2018 Document Reviewed: 03/11/2018 Elsevier Patient Education  2021 [employer].

## 2020-05-22 DIAGNOSIS — Z6841 Body Mass Index (BMI) 40.0 and over, adult: Secondary | ICD-10-CM | POA: Insufficient documentation

## 2020-05-22 LAB — CBC WITH DIFFERENTIAL/PLATELET
Basophils Absolute: 0.1 10*3/uL (ref 0.0–0.2)
Basos: 1 %
EOS (ABSOLUTE): 0.3 10*3/uL (ref 0.0–0.4)
Eos: 4 %
Hematocrit: 47.1 % (ref 37.5–51.0)
Hemoglobin: 15.1 g/dL (ref 13.0–17.7)
Immature Grans (Abs): 0 10*3/uL (ref 0.0–0.1)
Immature Granulocytes: 0 %
Lymphocytes Absolute: 2.1 10*3/uL (ref 0.7–3.1)
Lymphs: 23 %
MCH: 27.8 pg (ref 26.6–33.0)
MCHC: 32.1 g/dL (ref 31.5–35.7)
MCV: 87 fL (ref 79–97)
Monocytes Absolute: 0.6 10*3/uL (ref 0.1–0.9)
Monocytes: 7 %
Neutrophils Absolute: 6 10*3/uL (ref 1.4–7.0)
Neutrophils: 65 %
Platelets: 280 10*3/uL (ref 150–450)
RBC: 5.43 x10E6/uL (ref 4.14–5.80)
RDW: 12.4 % (ref 11.6–15.4)
WBC: 9.1 10*3/uL (ref 3.4–10.8)

## 2020-05-22 LAB — BRAIN NATRIURETIC PEPTIDE: BNP: 291.6 pg/mL — ABNORMAL HIGH (ref 0.0–100.0)

## 2020-05-22 LAB — PSA: Prostate Specific Ag, Serum: 0.3 ng/mL (ref 0.0–4.0)

## 2020-05-22 LAB — LIPID PANEL
Chol/HDL Ratio: 3.9 ratio (ref 0.0–5.0)
Cholesterol, Total: 154 mg/dL (ref 100–199)
HDL: 39 mg/dL — ABNORMAL LOW (ref >39)
LDL Chol Calc (NIH): 100 mg/dL — ABNORMAL HIGH (ref 0–99)
Triglycerides: 79 mg/dL (ref 0–149)
VLDL Cholesterol Cal: 15 mg/dL (ref 5–40)

## 2020-05-22 LAB — COMPREHENSIVE METABOLIC PANEL
ALT: 23 IU/L (ref 0–44)
AST: 15 IU/L (ref 0–40)
Albumin/Globulin Ratio: 1.3 (ref 1.2–2.2)
Albumin: 3.6 g/dL — ABNORMAL LOW (ref 4.0–5.0)
Alkaline Phosphatase: 108 IU/L (ref 44–121)
BUN/Creatinine Ratio: 13 (ref 9–20)
BUN: 17 mg/dL (ref 6–24)
Bilirubin Total: 0.6 mg/dL (ref 0.0–1.2)
CO2: 24 mmol/L (ref 20–29)
Calcium: 8.8 mg/dL (ref 8.7–10.2)
Chloride: 101 mmol/L (ref 96–106)
Creatinine, Ser: 1.32 mg/dL — ABNORMAL HIGH (ref 0.76–1.27)
GFR calc Af Amer: 72 mL/min/{1.73_m2} (ref >59)
GFR calc non Af Amer: 62 mL/min/{1.73_m2} (ref >59)
Globulin, Total: 2.8 g/dL (ref 1.5–4.5)
Glucose: 308 mg/dL — ABNORMAL HIGH (ref 65–99)
Potassium: 5.2 mmol/L (ref 3.5–5.2)
Sodium: 137 mmol/L (ref 134–144)
Total Protein: 6.4 g/dL (ref 6.0–8.5)

## 2020-05-22 LAB — HEMOGLOBIN A1C
Est. average glucose Bld gHb Est-mCnc: 301 mg/dL
Hgb A1c MFr Bld: 12.1 % — ABNORMAL HIGH (ref 4.8–5.6)

## 2020-05-22 LAB — TSH: TSH: 0.85 u[IU]/mL (ref 0.450–4.500)

## 2020-05-22 NOTE — Progress Notes (Signed)
Glucose and hemoglobin A1C 12.1  is not in control, recommend endocrinology referral for patient's uncontrolled diabetes. Creatinine remains elevated, is slightly improved from 3 months ago, likely will improve with better glucose control as well as hypertension control.  Please place referral to endocrinologist of choice as urgent for uncontrolled diabetes. CBC within normal limits.   HDL low ( good cholesterol) LDL  mildly elevated.  Discuss lifestyle modification with patient e.g. increase exercise, fiber, fruits, vegetables, lean meat, and omega 3/fish intake and decrease saturated fat. TSH for thyroid within normal limits.  PSA for prostate is within normal limits.  BNP is stable.

## 2020-05-29 ENCOUNTER — Telehealth: Payer: Self-pay

## 2020-05-29 DIAGNOSIS — E1165 Type 2 diabetes mellitus with hyperglycemia: Secondary | ICD-10-CM

## 2020-05-29 NOTE — Telephone Encounter (Signed)
-----   Message from Berniece Pap, FNP sent at 05/22/2020 12:08 PM EST ----- Glucose and hemoglobin A1C 12.1  is not in control, recommend endocrinology referral for patient's uncontrolled diabetes. Creatinine remains elevated, is slightly improved from 3 months ago, likely will improve with better glucose control as well as hypertension control.  Please place referral to endocrinologist of choice as urgent for uncontrolled diabetes. CBC within normal limits.   HDL low ( good cholesterol) LDL  mildly elevated.  Discuss lifestyle modification with patient e.g. increase exercise, fiber, fruits, vegetables, lean meat, and omega 3/fish intake and decrease saturated fat. TSH for thyroid within normal limits.  PSA for prostate is within normal limits.  BNP is stable.

## 2020-05-29 NOTE — Telephone Encounter (Signed)
Patient was advised, KW 

## 2020-06-06 ENCOUNTER — Ambulatory Visit: Payer: Medicaid Other | Admitting: Family

## 2020-06-09 ENCOUNTER — Encounter: Payer: Self-pay | Admitting: Adult Health

## 2020-06-09 ENCOUNTER — Other Ambulatory Visit: Payer: Self-pay

## 2020-06-09 ENCOUNTER — Ambulatory Visit (INDEPENDENT_AMBULATORY_CARE_PROVIDER_SITE_OTHER): Payer: BC Managed Care – PPO | Admitting: Adult Health

## 2020-06-09 VITALS — BP 194/93 | HR 68 | Temp 98.2°F | Resp 16 | Wt 380.6 lb

## 2020-06-09 DIAGNOSIS — I10 Essential (primary) hypertension: Secondary | ICD-10-CM

## 2020-06-09 DIAGNOSIS — Z6841 Body Mass Index (BMI) 40.0 and over, adult: Secondary | ICD-10-CM | POA: Diagnosis not present

## 2020-06-09 NOTE — Patient Instructions (Signed)
Amlodipine Tablets What is this medicine? AMLODIPINE (am LOE di peen) is a calcium channel blocker. It relaxes your blood vessels and decreases the amount of work the heart has to do. It treats high blood pressure and/or prevents chest pain (also called angina). This medicine may be used for other purposes; ask your health care provider or pharmacist if you have questions. COMMON BRAND NAME(S): Norvasc What should I tell my health care provider before I take this medicine? They need to know if you have any of these conditions:  heart disease  liver disease  an unusual or allergic reaction to amlodipine, other drugs, foods, dyes, or preservatives  pregnant or trying to get pregnant  breast-feeding How should I use this medicine? Take this medicine by mouth. Take it as directed on the prescription label at the same time every day. You can take it with or without food. If it upsets your stomach, take it with food. Keep taking it unless your health care provider tells you to stop. Talk to your health care provider about the use of this medicine in children. While it may be prescribed for children as young as 6 for selected conditions, precautions do apply. Overdosage: If you think you have taken too much of this medicine contact a poison control center or emergency room at once. NOTE: This medicine is only for you. Do not share this medicine with others. What if I miss a dose? If you miss a dose, take it as soon as you can. If it is almost time for your next dose, take only that dose. Do not take double or extra doses. What may interact with this medicine? This medicine may interact with the following medications:  clarithromycin  cyclosporine  diltiazem  itraconazole  simvastatin  tacrolimus This list may not describe all possible interactions. Give your health care provider a list of all the medicines, herbs, non-prescription drugs, or dietary supplements you use. Also tell them  if you smoke, drink alcohol, or use illegal drugs. Some items may interact with your medicine. What should I watch for while using this medicine? Visit your health care provider for regular checks on your progress. Check your blood pressure as directed. Ask your health care provider what your blood pressure should be. Also, find out when you should contact him or her. Do not treat yourself for coughs, colds, or pain while you are using this medicine without asking your health care provider for advice. Some medicines may increase your blood pressure. You may get drowsy or dizzy. Do not drive, use machinery, or do anything that needs mental alertness until you know how this medicine affects you. Do not stand up or sit up quickly, especially if you are an older patient. This reduces the risk of dizzy or fainting spells. Alcohol can make you more drowsy and dizzy. Avoid alcoholic drinks. What side effects may I notice from receiving this medicine? Side effects that you should report to your doctor or health care provider as soon as possible:  allergic reactions (skin rash, itching or hives; swelling of the face, lips, or tongue)  heart attack (trouble breathing; pain or tightness in the chest, neck, back or arms; unusually weak or tired)  low blood pressure (dizziness; feeling faint or lightheaded, falls; unusually weak or tired) Side effects that usually do not require medical attention (report these to your doctor or health care provider if they continue or are bothersome):  facial flushing  nausea  palpitations  stomach pain    sudden weight gain  swelling of the ankles, feet, hands This list may not describe all possible side effects. Call your doctor for medical advice about side effects. You may report side effects to FDA at 1-800-FDA-1088. Where should I keep my medicine? Keep out of the reach of children and pets. Store at room temperature between 20 and 25 degrees C (68 and 77 degrees  F). Protect from light and moisture. Keep the container tightly closed. Get rid of any unused medicine after the expiration date. To get rid of medicines that are no longer needed or have expired:  Take the medicine to a medicine take-back program. Check with your pharmacy or law enforcement to find a location.  If you cannot return the medicine, check the label or package insert to see if the medicine should be thrown out in the garbage or flushed down the toilet. If you are not sure, ask your health care provider. If it is safe to put in the trash, empty the medicine out of the container. Mix the medicine with cat litter, dirt, coffee grounds, or other unwanted substance. Seal the mixture in a bag or container. Put it in the trash. NOTE: This sheet is a summary. It may not cover all possible information. If you have questions about this medicine, talk to your doctor, pharmacist, or health care provider.  2021 Elsevier/Gold Standard (2020-02-12 14:59:47)  

## 2020-06-09 NOTE — Progress Notes (Signed)
Established patient visit   Patient: Jeremiah Vance   DOB: 03-04-1971   50 y.o. Male  MRN: [id number] Visit Date: 06/09/2020  Today's healthcare provider: Jairo Ben, FNP   Chief Complaint  Patient presents with  . Hypertension   Subjective    HPI  Hypertension, follow-up  BP Readings from Last 3 Encounters:  06/09/20 (!) 194/93  05/19/20 (!) 181/100  03/16/20 (!) 158/100   Wt Readings from Last 3 Encounters:  06/09/20 (!) 380 lb 9.6 oz (172.6 kg)  05/19/20 (!) 383 lb 3.2 oz (173.8 kg)  03/16/20 (!) 378 lb (171.5 kg)     He was last seen for hypertension 3 weeks ago.  BP at that visit was 181/100. Management since that visit includes started patient on Amlodipine 2.5mg .  He reports poor compliance with treatment.Patient has not picked up prescription from pharmacy.  He is not having side effects.  He is following a Regular diet. He is not exercising. He does not smoke.  Use of agents associated with hypertension: NSAIDS.   Outside blood pressures are recent reading of 170/90. Symptoms: No chest pain No chest pressure  No palpitations No syncope  No dyspnea No orthopnea  No paroxysmal nocturnal dyspnea Yes lower extremity edema   Pertinent labs: Lab Results  Component Value Date   CHOL 154 05/19/2020   HDL 39 (L) 05/19/2020   LDLCALC 100 (H) 05/19/2020   TRIG 79 05/19/2020   CHOLHDL 3.9 05/19/2020   Lab Results  Component Value Date   NA 137 05/19/2020   K 5.2 05/19/2020   CREATININE 1.32 (H) 05/19/2020   GFRNONAA 62 05/19/2020   GFRAA 72 05/19/2020   GLUCOSE 308 (H) 05/19/2020     The ASCVD Risk score (Goff DC Jr., et al., 2013) failed to calculate for the following reasons:   The patient has a prior MI or stroke diagnosis  Patient  denies any fever, body aches,chills, rash, chest pain, shortness of breath, nausea, vomiting, or diarrhea.  Denies dizziness, lightheadedness, pre syncopal or syncopal episodes.    ---------------------------------------------------------------------------------------------------  Patient Active Problem List   Diagnosis Date Noted  . BMI 50.0-59.9, adult (HCC) 05/22/2020  . Cough   . Other cirrhosis of liver (HCC)   . Ventral hernia without obstruction or gangrene   . Acute CHF (congestive heart failure) (HCC) 02/20/2020  . Shortness of breath   . Hypertensive urgency   . Acute respiratory failure with hypoxia (HCC)   . Neck pain   . Prostate cancer screening 01/21/2020  . Lymphedema of both lower extremities 10/17/2019  . History of stroke 10/17/2019  . CKD (chronic kidney disease) stage 3, GFR 30-59 ml/min (HCC) 08/31/2019  . Swelling of limb 08/31/2019  . Type 2 diabetes mellitus with hyperlipidemia (HCC) 07/08/2019  . Acute kidney injury superimposed on CKD (HCC) 07/08/2019  . Chest pain 07/08/2019  . Vitreous floaters of both eyes 06/03/2018  . Moderate obstructive sleep apnea 02/11/2018  . Chronic fatigue 12/16/2017  . Essential hypertension 12/16/2017  . History of small bowel obstruction 10/24/2017  . Adjustment disorder with depressed mood 09/30/2017  . Stroke (HCC) 09/14/2017  . Acute on chronic heart failure with preserved ejection fraction (HFpEF) (HCC)   . Hyperlipidemia   . Gout   . Elevated LFTs   . Heart failure with preserved ejection fraction (HCC)   . Morbid obesity (HCC)   . Hypoalbuminemia 07/11/2017  . Type 2 diabetes mellitus with hyperglycemia (HCC) 07/10/2017  Past Medical History:  Diagnosis Date  . Acute ischemic stroke (HCC) 09/06/2017  . Acute renal failure superimposed on stage 3a chronic kidney disease (HCC) 07/08/2019  . Acute right-sided weakness   . Allergies   . Anasarca   . Anemia 07/10/2017  . Arthritis   . CHF (congestive heart failure) (HCC)    "coinsided w/kidney problems I was having 06/2017"  . Chicken pox   . CKD (chronic kidney disease) stage 3, GFR 30-59 ml/min (HCC) 06/2017  . Depression   .  Elevated troponin 07/08/2019  . High cholesterol   . History of cardiomyopathy    LVEF 40 to 45% in April 2019 - subsequently normalized  . Hyperbilirubinemia 07/10/2017  . Hypertension   . Ischemic stroke (HCC)    Small left internal capsule infarct due to lacunar disease  . Morbid obesity (HCC)   . Normocytic anemia 12/16/2017  . 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 Stroke (HCC) 08/2017   "right sided weakness since; getting stronger though" (10/22/2017)  . Type 2 diabetes mellitus (HCC)    Allergies  Allergen Reactions  . Sulfa Antibiotics   . Pollen Extract Other (See Comments)       Medications: Outpatient Medications Prior to Visit  Medication Sig  . aspirin 81 MG chewable tablet Chew 1 tablet (81 mg total) by mouth daily.  [name] Kitchen atorvastatin (LIPITOR) 40 MG tablet Take 1 tablet (40 mg total) by mouth daily at 6 PM.  . carvedilol (COREG) 25 MG tablet Take 1 tablet (25 mg total) by mouth 2 (two) times daily with a meal. Start after you run out of Losartan.  . empagliflozin (JARDIANCE) 10 MG TABS tablet Take 1 tablet (10 mg total) by mouth daily before breakfast.  . irbesartan (AVAPRO) 300 MG tablet Take 1 tablet (300 mg total) by mouth daily.  [name] Kitchen torsemide (DEMADEX) 20 MG tablet Take 2 tablets (40 mg total) by mouth daily.  [name] Kitchen amLODipine (NORVASC) 2.5 MG tablet Take 1 tablet (2.5 mg total) by mouth daily. (Patient not taking: Reported on 06/09/2020)  . insulin degludec (TRESIBA FLEXTOUCH) 200 UNIT/ML FlexTouch Pen Inject 10 Units into the skin daily. (Patient not taking: No sig reported)   No facility-administered medications prior to visit.    Review of Systems     Objective    BP (!) 194/93   Pulse 68   Temp 98.2 F (36.8 C) (Oral)   Resp 16   Wt (!) 380 lb 9.6 oz (172.6 kg)   SpO2 98%   BMI 54.61 kg/m     Physical Exam Constitutional:      General: He is not in acute distress.    Appearance: Normal appearance. He is  well-developed. He is not ill-appearing, toxic-appearing or diaphoretic.  HENT:     Head: Normocephalic and atraumatic.     Right Ear: Hearing, tympanic membrane, ear canal and external ear normal.     Left Ear: Hearing, tympanic membrane, ear canal and external ear normal.     Nose: Nose normal.     Mouth/Throat:     Mouth: Mucous membranes are moist.     Pharynx: Uvula midline. No oropharyngeal exudate.  Eyes:     General: Lids are normal. No scleral icterus.       Right eye: No discharge.        Left eye: No discharge.     Extraocular Movements: Extraocular movements intact.     Conjunctiva/sclera: Conjunctivae normal.  Pupils: Pupils are equal, round, and reactive to light.  Neck:     Thyroid: No thyromegaly.     Vascular: Normal carotid pulses. No carotid bruit, hepatojugular reflux or JVD.     Trachea: Trachea and phonation normal. No tracheal tenderness or tracheal deviation.     Meningeal: Brudzinski's sign absent.  Cardiovascular:     Rate and Rhythm: Normal rate and regular rhythm.     Pulses: Normal pulses.     Heart sounds: Normal heart sounds, S1 normal and S2 normal. Heart sounds not distant. No murmur heard. No friction rub. No gallop.   Pulmonary:     Effort: Pulmonary effort is normal. No accessory muscle usage or respiratory distress.     Breath sounds: Normal breath sounds. No stridor. No wheezing or rales.  Chest:     Chest wall: No tenderness.  Abdominal:     General: Bowel sounds are normal. There is no distension.     Palpations: Abdomen is soft. There is no mass.     Tenderness: There is no abdominal tenderness. There is no right CVA tenderness, left CVA tenderness, guarding or rebound.     Hernia: No hernia is present.  Musculoskeletal:        General: No tenderness or deformity. Normal range of motion.     Cervical back: Full passive range of motion without pain, normal range of motion and neck supple.  Lymphadenopathy:     Head:     Right side of  head: No submental, submandibular, tonsillar, preauricular, posterior auricular or occipital adenopathy.     Left side of head: No submental, submandibular, tonsillar, preauricular, posterior auricular or occipital adenopathy.     Cervical: No cervical adenopathy.  Skin:    General: Skin is warm and dry.     Coloration: Skin is not pale.     Findings: No erythema or rash.     Nails: There is no clubbing.  Neurological:     General: No focal deficit present.     Mental Status: He is alert and oriented to person, place, and time. Mental status is at baseline.     GCS: GCS eye subscore is 4. GCS verbal subscore is 5. GCS motor subscore is 6.     Cranial Nerves: No cranial nerve deficit.     Sensory: No sensory deficit.     Motor: No abnormal muscle tone.     Coordination: Coordination normal.     Deep Tendon Reflexes: Reflexes are normal and symmetric. Reflexes normal.  Psychiatric:        Mood and Affect: Mood normal.        Speech: Speech normal.        Behavior: Behavior normal.        Thought Content: Thought content normal.        Judgment: Judgment normal.      No results found for any visits on 06/09/20.  Assessment & Plan     BMI 50.0-59.9, adult [facility])  Essential hypertension  Start amlodipine as prescribed at last visit, he had not started it.   Red Flags discussed. The patient was given clear instructions to go to ER or return to medical center if any red flags develop, symptoms do not improve, worsen or new problems develop. They verbalized understanding.  Return in about 3 weeks (around 06/30/2020), or if symptoms worsen or fail to improve, for at any time for any worsening symptoms, Go to Emergency room/ urgent care if worse.  The entirety of the information documented in the History of Present Illness, Review of Systems and Physical Exam were personally obtained by me. Portions of this information were initially documented by the CMA and reviewed by me for  thoroughness and accuracy.      Jairo Ben, FNP  [facility] [phone] (phone) [phone] (fax)  [facility]

## 2020-06-12 NOTE — Progress Notes (Deleted)
Patient ID: Jeremiah Vance, male    DOB: 1970/12/29, 50 y.o.   MRN: [id number]   Mr Maclellan is a 50 y/o male with a history of DM, hyperlipidemia, HTN, CKD, stroke, morbid obesity and chronic heart failure.   Echo report from 07/09/19 reviewed and showed an EF of 50-55% along with mild LVH.  Admitted 02/19/20 due to acute on chronic HF. Cardiology consult obtained. Initially given IV lasix with transition to oral diuretics. Nebulizer, inhalers and steroids started. CXR negative for pneumonia. Needed oxygen at 3L to maintain sats. Uncontrolled diabetes but due to sudden death in patient's family, he was needing discharged. Discharged after 7 days.   He presents today for a follow-up visit with a chief complaint of   Past Medical History:  Diagnosis Date  . Acute ischemic stroke (HCC) 09/06/2017  . Acute renal failure superimposed on stage 3a chronic kidney disease (HCC) 07/08/2019  . Acute right-sided weakness   . Allergies   . Anasarca   . Anemia 07/10/2017  . Arthritis   . CHF (congestive heart failure) (HCC)    "coinsided w/kidney problems I was having 06/2017"  . Chicken pox   . CKD (chronic kidney disease) stage 3, GFR 30-59 ml/min (HCC) 06/2017  . Depression   . Elevated troponin 07/08/2019  . High cholesterol   . History of cardiomyopathy    LVEF 40 to 45% in April 2019 - subsequently normalized  . Hyperbilirubinemia 07/10/2017  . Hypertension   . Ischemic stroke (HCC)    Small left internal capsule infarct due to lacunar disease  . Morbid obesity (HCC)   . Normocytic anemia 12/16/2017  . 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 Stroke (HCC) 08/2017   "right sided weakness since; getting stronger though" (10/22/2017)  . Type 2 diabetes mellitus (HCC)    Past Surgical History:  Procedure Laterality Date  . ABDOMINAL HERNIA REPAIR  2008; 10/22/2017   "scope; OPEN REPAIR INCARCERATED VENTRAL HERNIA  . HERNIA REPAIR    . KNEE ARTHROSCOPY  Right 1989  . VENTRAL HERNIA REPAIR N/A 10/22/2017   Procedure: OPEN REPAIR INCARCERATED VENTRAL HERNIA;  Surgeon: Glenna Fellows, MD;  Location: [facility] OR;  Service: General;  Laterality: N/A;   Family History  Problem Relation Age of Onset  . Diabetes Father   . Heart disease Father   . Arthritis Father   . Hearing loss Father   . Hyperlipidemia Father   . Heart attack Father   . Hypertension Father   . Stroke Father   . Diabetes Sister   . Diabetes Mother   . Stroke Mother   . Arthritis Mother   . Depression Mother   . Heart disease Mother   . Hypertension Mother   . Learning disabilities Mother   . Mental illness Mother   . Diabetes Maternal Grandmother   . Heart disease Maternal Grandmother   . Depression Maternal Grandmother   . Hyperlipidemia Maternal Grandmother   . Hypertension Maternal Grandmother   . Stroke Maternal Grandmother   . Stroke Paternal Grandfather   . Heart disease Paternal Grandfather   . Arthritis Paternal Grandfather   . Heart attack Paternal Grandfather   . Hypertension Maternal Grandfather   . Hyperlipidemia Maternal Grandfather   . Stroke Maternal Grandfather   . Arthritis Paternal Grandmother   . Hearing loss Paternal Grandmother   . Depression Sister   . Diabetes Sister   . Hypertension Sister   . Mental illness Sister  Social History   Tobacco Use  . Smoking status: Never Smoker  . Smokeless tobacco: Never Used  Substance Use Topics  . Alcohol use: Not Currently   Allergies  Allergen Reactions  . Sulfa Antibiotics   . Pollen Extract Other (See Comments)      Review of Systems  Constitutional: Positive for fatigue (minimal). Negative for appetite change.  HENT: Negative for congestion, postnasal drip and sore throat.   Eyes: Negative.   Respiratory: Positive for cough (productive) and wheezing ("improving"). Negative for shortness of breath.   Cardiovascular: Negative for chest pain, palpitations and leg swelling.   Gastrointestinal: Negative for abdominal distention and abdominal pain.  Endocrine: Negative.   Genitourinary: Negative.   Musculoskeletal: Negative for back pain and neck pain.  Skin: Negative.   Allergic/Immunologic: Negative.   Neurological: Negative for dizziness and light-headedness.  Hematological: Negative for adenopathy. Does not bruise/bleed easily.  Psychiatric/Behavioral: Positive for dysphoric mood and sleep disturbance (sleeping on 1 pillow; trouble sleeping due to loss of dad). The patient is not nervous/anxious.      Physical Exam Vitals and nursing note reviewed.  Constitutional:      Appearance: Normal appearance.  HENT:     Head: Normocephalic and atraumatic.  Cardiovascular:     Rate and Rhythm: Normal rate and regular rhythm.  Pulmonary:     Effort: Pulmonary effort is normal. No respiratory distress.     Breath sounds: No wheezing or rales.  Abdominal:     General: There is no distension.     Palpations: Abdomen is soft.  Musculoskeletal:        General: No tenderness.     Cervical back: Normal range of motion and neck supple.     Right lower leg: Edema (1+ pitting) present.     Left lower leg: Edema (1+ pitting) present.  Skin:    General: Skin is warm and dry.  Neurological:     General: No focal deficit present.     Mental Status: He is alert and oriented to person, place, and time.  Psychiatric:        Mood and Affect: Mood normal.        Behavior: Behavior normal.        Thought Content: Thought content normal.    Assessment & Plan:  1: Chronic heart failure with preserved ejection fraction with structural changes- - NYHA class II - euvolemic today - weighing daily; reminded to call for an overnight weight gain of >2 pounds or a weekly weight gain of >5 pounds - weight 379.6 from last visit here 3 months ago - not adding salt to his food and is trying to read labels for sodium content; reviewed the importance of closely following a 2000mg   sodium diet - saw cardiology (Agbor-Etang) 03/16/20 - BNP 02/19/20 was 267.9  2: HTN- - BP  - saw PCP (Flinchum) 06/09/20 - BMP 05/19/20 reviewed and showed sodium 137, potassium 5.2, creatinine 1.32 and GFR 62  3: DM- - A1c 05/19/20 was 12.1% - fasting glucose in clinic today was   4: Lymphedema- - stage 2 - elevating his legs when sitting for long periods of time at home; is currently not working - encouraged to get compression socks, put them on every morning and remove them at bedtime - consider lymphapress compression boots if edema persists.   Patient did not bring his medications nor a list. Each medication was verbally reviewed with the patient and he was encouraged to bring the bottles  to every visit to confirm accuracy of list.

## 2020-06-13 ENCOUNTER — Ambulatory Visit: Payer: Medicaid Other | Admitting: Family

## 2020-06-14 ENCOUNTER — Encounter: Payer: Self-pay | Admitting: Endocrinology

## 2020-06-21 ENCOUNTER — Encounter: Payer: Self-pay | Admitting: Family

## 2020-06-21 ENCOUNTER — Other Ambulatory Visit: Payer: Self-pay

## 2020-06-21 ENCOUNTER — Ambulatory Visit: Payer: BC Managed Care – PPO | Attending: Family | Admitting: Family

## 2020-06-21 VITALS — BP 190/100 | HR 74 | Resp 18 | Ht 70.0 in | Wt 378.1 lb

## 2020-06-21 DIAGNOSIS — Z7982 Long term (current) use of aspirin: Secondary | ICD-10-CM | POA: Insufficient documentation

## 2020-06-21 DIAGNOSIS — E1122 Type 2 diabetes mellitus with diabetic chronic kidney disease: Secondary | ICD-10-CM | POA: Insufficient documentation

## 2020-06-21 DIAGNOSIS — Z79899 Other long term (current) drug therapy: Secondary | ICD-10-CM | POA: Insufficient documentation

## 2020-06-21 DIAGNOSIS — I5032 Chronic diastolic (congestive) heart failure: Secondary | ICD-10-CM

## 2020-06-21 DIAGNOSIS — N1831 Chronic kidney disease, stage 3a: Secondary | ICD-10-CM | POA: Diagnosis not present

## 2020-06-21 DIAGNOSIS — I10 Essential (primary) hypertension: Secondary | ICD-10-CM

## 2020-06-21 DIAGNOSIS — I509 Heart failure, unspecified: Secondary | ICD-10-CM | POA: Insufficient documentation

## 2020-06-21 DIAGNOSIS — I130 Hypertensive heart and chronic kidney disease with heart failure and stage 1 through stage 4 chronic kidney disease, or unspecified chronic kidney disease: Secondary | ICD-10-CM | POA: Diagnosis not present

## 2020-06-21 DIAGNOSIS — Z794 Long term (current) use of insulin: Secondary | ICD-10-CM | POA: Diagnosis not present

## 2020-06-21 DIAGNOSIS — I890 Lymphedema, not elsewhere classified: Secondary | ICD-10-CM | POA: Insufficient documentation

## 2020-06-21 DIAGNOSIS — Z8673 Personal history of transient ischemic attack (TIA), and cerebral infarction without residual deficits: Secondary | ICD-10-CM | POA: Insufficient documentation

## 2020-06-21 DIAGNOSIS — Z8249 Family history of ischemic heart disease and other diseases of the circulatory system: Secondary | ICD-10-CM | POA: Insufficient documentation

## 2020-06-21 DIAGNOSIS — Z6841 Body Mass Index (BMI) 40.0 and over, adult: Secondary | ICD-10-CM | POA: Diagnosis not present

## 2020-06-21 NOTE — Progress Notes (Signed)
Patient ID: Jeremiah Vance, male    DOB: 1970/09/07, 50 y.o.   MRN: [id number]   Mr Edelson is a 50 y/o male with a history of DM, hyperlipidemia, HTN, CKD, stroke, morbid obesity and chronic heart failure.   Echo report from 07/09/19 reviewed and showed an EF of 50-55% along with mild LVH.  Admitted 02/19/20 due to acute on chronic HF. Cardiology consult obtained. Initially given IV lasix with transition to oral diuretics. Nebulizer, inhalers and steroids started. CXR negative for pneumonia. Needed oxygen at 3L to maintain sats. Uncontrolled diabetes but due to sudden death in patient's family, he was needing discharged. Discharged after 7 days.   He presents today for a follow-up visit with a chief complaint of minimal fatigue upon moderate exertion. He describes this as chronic in nature having been present for several years with varying levels of severity. He has associated depression and chronic difficulty sleeping along with this. He denies any dizziness, abdominal distention, palpitations, pedal edema, chest pain, wheezing, shortness of breath, cough or weight gain.   Has not taken any of his medications yet today and says that "always gets nervous" when coming to appointments. Says his BP at home runs around 150's/ 80's.   Currently has a wound on his left shin where he got scratched trying to break up a dog fight.    Past Medical History:  Diagnosis Date  . Acute ischemic stroke (HCC) 09/06/2017  . Acute renal failure superimposed on stage 3a chronic kidney disease (HCC) 07/08/2019  . Acute right-sided weakness   . Allergies   . Anasarca   . Anemia 07/10/2017  . Arthritis   . CHF (congestive heart failure) (HCC)    "coinsided w/kidney problems I was having 06/2017"  . Chicken pox   . CKD (chronic kidney disease) stage 3, GFR 30-59 ml/min (HCC) 06/2017  . Depression   . Elevated troponin 07/08/2019  . High cholesterol   . History of cardiomyopathy    LVEF 40 to 45% in April 2019 -  subsequently normalized  . Hyperbilirubinemia 07/10/2017  . Hypertension   . Ischemic stroke (HCC)    Small left internal capsule infarct due to lacunar disease  . Morbid obesity (HCC)   . Normocytic anemia 12/16/2017  . 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 Stroke (HCC) 08/2017   "right sided weakness since; getting stronger though" (10/22/2017)  . Type 2 diabetes mellitus (HCC)    Past Surgical History:  Procedure Laterality Date  . ABDOMINAL HERNIA REPAIR  2008; 10/22/2017   "scope; OPEN REPAIR INCARCERATED VENTRAL HERNIA  . HERNIA REPAIR    . KNEE ARTHROSCOPY Right 1989  . VENTRAL HERNIA REPAIR N/A 10/22/2017   Procedure: OPEN REPAIR INCARCERATED VENTRAL HERNIA;  Surgeon: Glenna Fellows, MD;  Location: [facility] OR;  Service: General;  Laterality: N/A;   Family History  Problem Relation Age of Onset  . Diabetes Father   . Heart disease Father   . Arthritis Father   . Hearing loss Father   . Hyperlipidemia Father   . Heart attack Father   . Hypertension Father   . Stroke Father   . Diabetes Sister   . Diabetes Mother   . Stroke Mother   . Arthritis Mother   . Depression Mother   . Heart disease Mother   . Hypertension Mother   . Learning disabilities Mother   . Mental illness Mother   . Diabetes Maternal Grandmother   . Heart disease  Maternal Grandmother   . Depression Maternal Grandmother   . Hyperlipidemia Maternal Grandmother   . Hypertension Maternal Grandmother   . Stroke Maternal Grandmother   . Stroke Paternal Grandfather   . Heart disease Paternal Grandfather   . Arthritis Paternal Grandfather   . Heart attack Paternal Grandfather   . Hypertension Maternal Grandfather   . Hyperlipidemia Maternal Grandfather   . Stroke Maternal Grandfather   . Arthritis Paternal Grandmother   . Hearing loss Paternal Grandmother   . Depression Sister   . Diabetes Sister   . Hypertension Sister   . Mental illness Sister    Social  History   Tobacco Use  . Smoking status: Never Smoker  . Smokeless tobacco: Never Used  Substance Use Topics  . Alcohol use: Not Currently   Allergies  Allergen Reactions  . Sulfa Antibiotics   . Pollen Extract Other (See Comments)   Prior to Admission medications   Medication Sig Start Date End Date Taking? Authorizing Provider  amLODipine (NORVASC) 2.5 MG tablet Take 1 tablet (2.5 mg total) by mouth daily. 05/19/20  Yes Flinchum, Eula Fried, FNP  aspirin 81 MG chewable tablet Chew 1 tablet (81 mg total) by mouth daily. 09/20/19  Yes [name] A, FNP  atorvastatin (LIPITOR) 40 MG tablet Take 1 tablet (40 mg total) by mouth daily at 6 PM. 09/20/19  Yes Xian Alves, Jarold Song, FNP  carvedilol (COREG) 25 MG tablet Take 1 tablet (25 mg total) by mouth 2 (two) times daily with a meal. Start after you run out of Losartan. 03/16/20  Yes Agbor-Etang, Arlys John, MD  empagliflozin (JARDIANCE) 10 MG TABS tablet Take 1 tablet (10 mg total) by mouth daily before breakfast. 02/26/20  Yes Rhetta Mura, MD  insulin degludec (TRESIBA FLEXTOUCH) 200 UNIT/ML FlexTouch Pen Inject 10 Units into the skin daily. 03/09/20  Yes Glori Luis, MD  irbesartan (AVAPRO) 300 MG tablet Take 1 tablet (300 mg total) by mouth daily. 03/16/20  Yes Debbe Odea, MD  torsemide (DEMADEX) 20 MG tablet Take 2 tablets (40 mg total) by mouth daily. 03/09/20  Yes Sondra Barges, PA-C    Review of Systems  Constitutional: Positive for fatigue (minimal). Negative for appetite change.  HENT: Negative for congestion, postnasal drip and sore throat.   Eyes: Negative.   Respiratory: Negative for cough, shortness of breath and wheezing.   Cardiovascular: Negative for chest pain, palpitations and leg swelling.  Gastrointestinal: Negative for abdominal distention and abdominal pain.  Endocrine: Negative.   Genitourinary: Negative.   Musculoskeletal: Negative for back pain and neck pain.  Skin: Negative.    Allergic/Immunologic: Negative.   Neurological: Negative for dizziness and light-headedness.  Hematological: Negative for adenopathy. Does not bruise/bleed easily.  Psychiatric/Behavioral: Positive for dysphoric mood and sleep disturbance (sleeping on 1 pillow). The patient is not nervous/anxious.    Vitals:   06/21/20 1016 06/21/20 1039  BP: (!) 216/105 (!) 190/100  Pulse: 74   Resp: 18   SpO2: 92%   Weight: (!) 378 lb 2 oz (171.5 kg)   Height: 5\' 10"  (1.778 m)    Wt Readings from Last 3 Encounters:  06/21/20 (!) 378 lb 2 oz (171.5 kg)  06/09/20 (!) 380 lb 9.6 oz (172.6 kg)  05/19/20 (!) 383 lb 3.2 oz (173.8 kg)   Lab Results  Component Value Date   CREATININE 1.32 (H) 05/19/2020   CREATININE 1.31 (H) 03/09/2020   CREATININE 1.68 (H) 02/26/2020    Physical Exam Vitals and nursing note  reviewed.  Constitutional:      Appearance: Normal appearance.  HENT:     Head: Normocephalic and atraumatic.  Cardiovascular:     Rate and Rhythm: Normal rate and regular rhythm.  Pulmonary:     Effort: Pulmonary effort is normal. No respiratory distress.     Breath sounds: No wheezing or rales.  Abdominal:     General: There is no distension.     Palpations: Abdomen is soft.  Musculoskeletal:        General: No tenderness.     Cervical back: Normal range of motion and neck supple.     Right lower leg: Edema (1+ pitting) present.     Left lower leg: Edema (1+ pitting) present.  Skin:    General: Skin is warm and dry.     Comments: Bandage over left shin  Neurological:     General: No focal deficit present.     Mental Status: He is alert and oriented to person, place, and time.  Psychiatric:        Mood and Affect: Mood normal.        Behavior: Behavior normal.        Thought Content: Thought content normal.    Assessment & Plan:  1: Chronic heart failure with preserved ejection fraction with structural changes- - NYHA class II - euvolemic today - weighing daily; reminded  to call for an overnight weight gain of >2 pounds or a weekly weight gain of >5 pounds - weight stable from last visit here 3 months ago - not adding salt to his food and is trying to read labels for sodium content; reviewed the importance of closely following a 2000mg  sodium diet - saw cardiology (Agbor-Etang) 03/16/20 - BNP 02/19/20 was 267.9  2: HTN- - BP elevated even upon recheck although slightly better (190/100); he hasn't taken any of his medications yet today and he was encouraged to take them prior to appointments to assess efficacy of the medications; says that he's going home from here and will take them all - saw PCP (Flinchum) 06/09/20 - BMP 05/19/20 reviewed and showed sodium 137, potassium 5.2, creatinine 1.32 and GFR 62  3: DM- - A1c 05/19/20 was 12.1% - fasting glucose at home today was 165  4: Lymphedema- - stage 2 - elevating his legs when sitting for long periods of time at home; is currently not working - encouraged to get compression socks, put them on every morning and remove them at bedtime - consider lymphapress compression boots if edema persists.   Patient did not bring his medications nor a list. Each medication was verbally reviewed with the patient and he was encouraged to bring the bottles to every visit to confirm accuracy of list.  Due to HF stability, will not make a return appointment for patient at this time. Advised patient to continue close follow-up with cardiology and PCP but that he could call back at anytime to schedule another appointment or for any questions. Patient was comfortable with this plan.

## 2020-06-21 NOTE — Patient Instructions (Addendum)
Continue weighing daily and call for an overnight weight gain of > 2 pounds or a weekly weight gain of >5 pounds.    Call us in the future if you need us for anything  

## 2020-06-30 ENCOUNTER — Ambulatory Visit: Payer: Self-pay | Admitting: Adult Health

## 2020-09-12 ENCOUNTER — Ambulatory Visit: Payer: BC Managed Care – PPO | Admitting: Podiatry

## 2020-09-12 ENCOUNTER — Encounter: Payer: Self-pay | Admitting: Podiatry

## 2020-09-12 ENCOUNTER — Other Ambulatory Visit: Payer: Self-pay

## 2020-09-12 DIAGNOSIS — B351 Tinea unguium: Secondary | ICD-10-CM | POA: Diagnosis not present

## 2020-09-12 DIAGNOSIS — M79675 Pain in left toe(s): Secondary | ICD-10-CM | POA: Diagnosis not present

## 2020-09-12 DIAGNOSIS — L989 Disorder of the skin and subcutaneous tissue, unspecified: Secondary | ICD-10-CM

## 2020-09-12 DIAGNOSIS — M79674 Pain in right toe(s): Secondary | ICD-10-CM | POA: Diagnosis not present

## 2020-09-12 DIAGNOSIS — E0843 Diabetes mellitus due to underlying condition with diabetic autonomic (poly)neuropathy: Secondary | ICD-10-CM | POA: Diagnosis not present

## 2020-09-12 NOTE — Progress Notes (Signed)
   SUBJECTIVE Patient with a history of diabetes mellitus presents to office today complaining of elongated, thickened nails that cause pain while ambulating in shoes.  He is unable to trim his own nails. Patient is here for further evaluation and treatment.   Past Medical History:  Diagnosis Date   Acute ischemic stroke (HCC) 09/06/2017   Acute renal failure superimposed on stage 3a chronic kidney disease (HCC) 07/08/2019   Acute right-sided weakness    Allergies    Anasarca    Anemia 07/10/2017   Arthritis    CHF (congestive heart failure) (HCC)    "coinsided w/kidney problems I was having 06/2017"   Chicken pox    CKD (chronic kidney disease) stage 3, GFR 30-59 ml/min (HCC) 06/2017   Depression    Elevated troponin 07/08/2019   High cholesterol    History of cardiomyopathy    LVEF 40 to 45% in April 2019 - subsequently normalized   Hyperbilirubinemia 07/10/2017   Hypertension    Ischemic stroke (HCC)    Small left internal capsule infarct due to lacunar disease   Morbid obesity (HCC)    Normocytic anemia 12/16/2017   Recurrent incisional hernia with incarceration s/p repair 10/22/2017 10/21/2017   Stroke (HCC) 08/2017   "right sided weakness since; getting stronger though" (10/22/2017)   Type 2 diabetes mellitus (HCC)     OBJECTIVE General Patient is awake, alert, and oriented x 3 and in no acute distress. Derm Skin is dry and supple bilateral. Negative open lesions or macerations. Remaining integument unremarkable. Nails are tender, long, thickened and dystrophic with subungual debris, consistent with onychomycosis, 1-5 bilateral. No signs of infection noted.  Hyperkeratotic preulcerative calluses noted to the bilateral toes Vasc  DP and PT pedal pulses palpable bilaterally. Temperature gradient within normal limits.  Neuro Epicritic and protective threshold sensation diminished bilaterally.  Musculoskeletal Exam No symptomatic pedal deformities noted bilateral. Muscular strength  within normal limits.  ASSESSMENT 1. Diabetes Mellitus w/ peripheral neuropathy 2. Onychomycosis of nail due to dermatophyte bilateral 3.  Preulcerative callus lesions bilateral feet  4.  Pain in foot bilateral  PLAN OF CARE 1. Patient evaluated today. 2. Instructed to maintain good pedal hygiene and foot care. Stressed importance of controlling blood sugar.  3. Mechanical debridement of nails 1-5 bilaterally performed using a nail nipper. Filed with dremel without incident.  4.  Excisional debridement of the hyperkeratotic callus tissue was performed using a tissue nipper without incident or bleeding  5.  Return to clinic in 3 mos.     Candee Hoon M. Carletta Feasel, DPM [facility]  Dr. Tryton Bodi M. Cebert Dettmann, DPM    [address]                                      [place], [place] [address]                Office [phone]  Fax [phone]     

## 2020-09-14 ENCOUNTER — Ambulatory Visit: Payer: BC Managed Care – PPO | Admitting: Cardiology

## 2020-10-17 ENCOUNTER — Ambulatory Visit: Payer: BC Managed Care – PPO | Admitting: Cardiology

## 2020-11-27 ENCOUNTER — Ambulatory Visit: Payer: BC Managed Care – PPO | Admitting: Podiatry

## 2020-11-28 ENCOUNTER — Ambulatory Visit: Payer: BC Managed Care – PPO | Admitting: Cardiology

## 2020-11-29 ENCOUNTER — Encounter: Payer: Self-pay | Admitting: Cardiology

## 2021-06-05 ENCOUNTER — Other Ambulatory Visit: Payer: Self-pay

## 2021-06-05 ENCOUNTER — Ambulatory Visit: Payer: BC Managed Care – PPO | Admitting: Podiatry

## 2021-06-05 DIAGNOSIS — M79674 Pain in right toe(s): Secondary | ICD-10-CM

## 2021-06-05 DIAGNOSIS — B351 Tinea unguium: Secondary | ICD-10-CM | POA: Diagnosis not present

## 2021-06-05 DIAGNOSIS — E0843 Diabetes mellitus due to underlying condition with diabetic autonomic (poly)neuropathy: Secondary | ICD-10-CM | POA: Diagnosis not present

## 2021-06-05 DIAGNOSIS — L989 Disorder of the skin and subcutaneous tissue, unspecified: Secondary | ICD-10-CM

## 2021-06-05 DIAGNOSIS — M79675 Pain in left toe(s): Secondary | ICD-10-CM | POA: Diagnosis not present

## 2021-06-05 NOTE — Progress Notes (Signed)
? ?  SUBJECTIVE Patient with a history of diabetes mellitus presents to office today complaining of elongated, thickened nails that cause pain while ambulating in shoes.  He is unable to trim his own nails. Patient is here for further evaluation and treatment.   Past Medical History:  Diagnosis Date   Acute ischemic stroke (HCC) 09/06/2017   Acute renal failure superimposed on stage 3a chronic kidney disease (HCC) 07/08/2019   Acute right-sided weakness    Allergies    Anasarca    Anemia 07/10/2017   Arthritis    CHF (congestive heart failure) (HCC)    "coinsided w/kidney problems I was having 06/2017"   Chicken pox    CKD (chronic kidney disease) stage 3, GFR 30-59 ml/min (HCC) 06/2017   Depression    Elevated troponin 07/08/2019   High cholesterol    History of cardiomyopathy    LVEF 40 to 45% in April 2019 - subsequently normalized   Hyperbilirubinemia 07/10/2017   Hypertension    Ischemic stroke (HCC)    Small left internal capsule infarct due to lacunar disease   Morbid obesity (HCC)    Normocytic anemia 12/16/2017   Recurrent incisional hernia with incarceration s/p repair 10/22/2017 10/21/2017   Stroke (HCC) 08/2017   "right sided weakness since; getting stronger though" (10/22/2017)   Type 2 diabetes mellitus (HCC)     OBJECTIVE General Patient is awake, alert, and oriented x 3 and in no acute distress. Derm Skin is dry and supple bilateral. Negative open lesions or macerations. Remaining integument unremarkable. Nails are tender, long, thickened and dystrophic with subungual debris, consistent with onychomycosis, 1-5 bilateral. No signs of infection noted.  Hyperkeratotic preulcerative calluses noted to the bilateral toes Vasc  DP and PT pedal pulses palpable bilaterally. Temperature gradient within normal limits.  Neuro Epicritic and protective threshold sensation diminished bilaterally.  Musculoskeletal Exam No symptomatic pedal deformities noted bilateral. Muscular strength  within normal limits.  ASSESSMENT 1. Diabetes Mellitus w/ peripheral neuropathy 2. Onychomycosis of nail due to dermatophyte bilateral 3.  Preulcerative callus lesions bilateral feet  4.  Pain in foot bilateral  PLAN OF CARE 1. Patient evaluated today. 2. Instructed to maintain good pedal hygiene and foot care. Stressed importance of controlling blood sugar.  3. Mechanical debridement of nails 1-5 bilaterally performed using a nail nipper. Filed with dremel without incident.  4.  Excisional debridement of the hyperkeratotic callus tissue was performed using a tissue nipper without incident or bleeding  5.  Return to clinic in 3 mos.     Anaysia Germer M. Marlys Stegmaier, DPM [facility]  Dr. Chaim Gatley M. Taelynn Mcelhannon, DPM    [address]                                      [place], [place] [address]                Office [phone]  Fax [phone]     

## 2021-11-22 ENCOUNTER — Emergency Department: Payer: BC Managed Care – PPO

## 2021-11-22 ENCOUNTER — Inpatient Hospital Stay
Admission: EM | Admit: 2021-11-22 | Discharge: 2021-11-26 | DRG: 064 | Disposition: A | Discharge status: Home Health | Payer: BC Managed Care – PPO | Attending: Internal Medicine | Admitting: Internal Medicine

## 2021-11-22 ENCOUNTER — Encounter: Payer: Self-pay | Admitting: Emergency Medicine

## 2021-11-22 ENCOUNTER — Other Ambulatory Visit: Payer: Self-pay

## 2021-11-22 ENCOUNTER — Inpatient Hospital Stay: Admit: 2021-11-22 | Payer: BC Managed Care – PPO

## 2021-11-22 DIAGNOSIS — Z23 Encounter for immunization: Secondary | ICD-10-CM

## 2021-11-22 DIAGNOSIS — R4781 Slurred speech: Secondary | ICD-10-CM | POA: Diagnosis present

## 2021-11-22 DIAGNOSIS — G4733 Obstructive sleep apnea (adult) (pediatric): Secondary | ICD-10-CM | POA: Diagnosis present

## 2021-11-22 DIAGNOSIS — E118 Type 2 diabetes mellitus with unspecified complications: Secondary | ICD-10-CM | POA: Diagnosis not present

## 2021-11-22 DIAGNOSIS — Z8261 Family history of arthritis: Secondary | ICD-10-CM

## 2021-11-22 DIAGNOSIS — I10 Essential (primary) hypertension: Secondary | ICD-10-CM

## 2021-11-22 DIAGNOSIS — Y92481 Parking lot as the place of occurrence of the external cause: Secondary | ICD-10-CM

## 2021-11-22 DIAGNOSIS — Z822 Family history of deafness and hearing loss: Secondary | ICD-10-CM

## 2021-11-22 DIAGNOSIS — M7989 Other specified soft tissue disorders: Secondary | ICD-10-CM | POA: Diagnosis not present

## 2021-11-22 DIAGNOSIS — N1831 Chronic kidney disease, stage 3a: Secondary | ICD-10-CM | POA: Diagnosis present

## 2021-11-22 DIAGNOSIS — I161 Hypertensive emergency: Secondary | ICD-10-CM | POA: Diagnosis present

## 2021-11-22 DIAGNOSIS — I130 Hypertensive heart and chronic kidney disease with heart failure and stage 1 through stage 4 chronic kidney disease, or unspecified chronic kidney disease: Secondary | ICD-10-CM | POA: Diagnosis present

## 2021-11-22 DIAGNOSIS — E1165 Type 2 diabetes mellitus with hyperglycemia: Secondary | ICD-10-CM | POA: Diagnosis present

## 2021-11-22 DIAGNOSIS — I629 Nontraumatic intracranial hemorrhage, unspecified: Secondary | ICD-10-CM | POA: Diagnosis present

## 2021-11-22 DIAGNOSIS — Z818 Family history of other mental and behavioral disorders: Secondary | ICD-10-CM

## 2021-11-22 DIAGNOSIS — I890 Lymphedema, not elsewhere classified: Secondary | ICD-10-CM | POA: Diagnosis present

## 2021-11-22 DIAGNOSIS — R9431 Abnormal electrocardiogram [ECG] [EKG]: Secondary | ICD-10-CM | POA: Diagnosis not present

## 2021-11-22 DIAGNOSIS — Z91148 Patient's other noncompliance with medication regimen for other reason: Secondary | ICD-10-CM

## 2021-11-22 DIAGNOSIS — Z833 Family history of diabetes mellitus: Secondary | ICD-10-CM

## 2021-11-22 DIAGNOSIS — Z8673 Personal history of transient ischemic attack (TIA), and cerebral infarction without residual deficits: Secondary | ICD-10-CM | POA: Diagnosis not present

## 2021-11-22 DIAGNOSIS — R4189 Other symptoms and signs involving cognitive functions and awareness: Secondary | ICD-10-CM | POA: Diagnosis present

## 2021-11-22 DIAGNOSIS — Z91199 Patient's noncompliance with other medical treatment and regimen due to unspecified reason: Secondary | ICD-10-CM

## 2021-11-22 DIAGNOSIS — E7800 Pure hypercholesterolemia, unspecified: Secondary | ICD-10-CM | POA: Diagnosis present

## 2021-11-22 DIAGNOSIS — H548 Legal blindness, as defined in USA: Secondary | ICD-10-CM | POA: Diagnosis present

## 2021-11-22 DIAGNOSIS — F1910 Other psychoactive substance abuse, uncomplicated: Secondary | ICD-10-CM | POA: Diagnosis not present

## 2021-11-22 DIAGNOSIS — G936 Cerebral edema: Secondary | ICD-10-CM | POA: Diagnosis present

## 2021-11-22 DIAGNOSIS — I5032 Chronic diastolic (congestive) heart failure: Secondary | ICD-10-CM | POA: Diagnosis not present

## 2021-11-22 DIAGNOSIS — I5042 Chronic combined systolic (congestive) and diastolic (congestive) heart failure: Secondary | ICD-10-CM | POA: Diagnosis present

## 2021-11-22 DIAGNOSIS — Z881 Allergy status to other antibiotic agents status: Secondary | ICD-10-CM

## 2021-11-22 DIAGNOSIS — I610 Nontraumatic intracerebral hemorrhage in hemisphere, subcortical: Secondary | ICD-10-CM | POA: Diagnosis not present

## 2021-11-22 DIAGNOSIS — W1830XA Fall on same level, unspecified, initial encounter: Secondary | ICD-10-CM | POA: Diagnosis present

## 2021-11-22 DIAGNOSIS — Z83438 Family history of other disorder of lipoprotein metabolism and other lipidemia: Secondary | ICD-10-CM

## 2021-11-22 DIAGNOSIS — I5020 Unspecified systolic (congestive) heart failure: Secondary | ICD-10-CM

## 2021-11-22 DIAGNOSIS — Z20822 Contact with and (suspected) exposure to covid-19: Secondary | ICD-10-CM | POA: Diagnosis present

## 2021-11-22 DIAGNOSIS — E66813 Obesity, class 3: Secondary | ICD-10-CM

## 2021-11-22 DIAGNOSIS — E1122 Type 2 diabetes mellitus with diabetic chronic kidney disease: Secondary | ICD-10-CM | POA: Diagnosis present

## 2021-11-22 DIAGNOSIS — Z597 Insufficient social insurance and welfare support: Secondary | ICD-10-CM

## 2021-11-22 DIAGNOSIS — Z6841 Body Mass Index (BMI) 40.0 and over, adult: Secondary | ICD-10-CM | POA: Diagnosis not present

## 2021-11-22 DIAGNOSIS — I878 Other specified disorders of veins: Secondary | ICD-10-CM | POA: Diagnosis present

## 2021-11-22 DIAGNOSIS — I429 Cardiomyopathy, unspecified: Secondary | ICD-10-CM | POA: Diagnosis present

## 2021-11-22 DIAGNOSIS — Z8249 Family history of ischemic heart disease and other diseases of the circulatory system: Secondary | ICD-10-CM

## 2021-11-22 DIAGNOSIS — I639 Cerebral infarction, unspecified: Secondary | ICD-10-CM

## 2021-11-22 DIAGNOSIS — Z823 Family history of stroke: Secondary | ICD-10-CM

## 2021-11-22 DIAGNOSIS — I420 Dilated cardiomyopathy: Secondary | ICD-10-CM | POA: Diagnosis not present

## 2021-11-22 LAB — CBC WITH DIFFERENTIAL/PLATELET
Abs Immature Granulocytes: 0.02 10*3/uL (ref 0.00–0.07)
Basophils Absolute: 0.1 10*3/uL (ref 0.0–0.1)
Basophils Relative: 1 %
Eosinophils Absolute: 0.3 10*3/uL (ref 0.0–0.5)
Eosinophils Relative: 3 %
HCT: 44.6 % (ref 39.0–52.0)
Hemoglobin: 14 g/dL (ref 13.0–17.0)
Immature Granulocytes: 0 %
Lymphocytes Relative: 21 %
Lymphs Abs: 2 10*3/uL (ref 0.7–4.0)
MCH: 27.5 pg (ref 26.0–34.0)
MCHC: 31.4 g/dL (ref 30.0–36.0)
MCV: 87.6 fL (ref 80.0–100.0)
Monocytes Absolute: 0.8 10*3/uL (ref 0.1–1.0)
Monocytes Relative: 8 %
Neutro Abs: 6.3 10*3/uL (ref 1.7–7.7)
Neutrophils Relative %: 67 %
Platelets: 305 10*3/uL (ref 150–400)
RBC: 5.09 MIL/uL (ref 4.22–5.81)
RDW: 13.1 % (ref 11.5–15.5)
WBC: 9.4 10*3/uL (ref 4.0–10.5)
nRBC: 0 % (ref 0.0–0.2)

## 2021-11-22 LAB — LIPID PANEL
Cholesterol: 162 mg/dL (ref 0–200)
HDL: 30 mg/dL — ABNORMAL LOW (ref >40)
LDL Cholesterol: 105 mg/dL — ABNORMAL HIGH (ref 0–99)
Total CHOL/HDL Ratio: 5.4 RATIO
Triglycerides: 135 mg/dL (ref <150)
VLDL: 27 mg/dL (ref 0–40)

## 2021-11-22 LAB — PHOSPHORUS: Phosphorus: 2.4 mg/dL — ABNORMAL LOW (ref 2.5–4.6)

## 2021-11-22 LAB — MAGNESIUM: Magnesium: 1.8 mg/dL (ref 1.7–2.4)

## 2021-11-22 LAB — HEPATIC FUNCTION PANEL
ALT: 15 U/L (ref 0–44)
AST: 16 U/L (ref 15–41)
Albumin: 3.1 g/dL — ABNORMAL LOW (ref 3.5–5.0)
Alkaline Phosphatase: 70 U/L (ref 38–126)
Bilirubin, Direct: 0.2 mg/dL (ref 0.0–0.2)
Indirect Bilirubin: 1 mg/dL — ABNORMAL HIGH (ref 0.3–0.9)
Total Bilirubin: 1.2 mg/dL (ref 0.3–1.2)
Total Protein: 7.2 g/dL (ref 6.5–8.1)

## 2021-11-22 LAB — BASIC METABOLIC PANEL
Anion gap: 7 (ref 5–15)
BUN: 26 mg/dL — ABNORMAL HIGH (ref 6–20)
CO2: 25 mmol/L (ref 22–32)
Calcium: 8.5 mg/dL — ABNORMAL LOW (ref 8.9–10.3)
Chloride: 104 mmol/L (ref 98–111)
Creatinine, Ser: 1.36 mg/dL — ABNORMAL HIGH (ref 0.61–1.24)
GFR, Estimated: >60 mL/min (ref >60)
Glucose, Bld: 226 mg/dL — ABNORMAL HIGH (ref 70–99)
Potassium: 4.1 mmol/L (ref 3.5–5.1)
Sodium: 136 mmol/L (ref 135–145)

## 2021-11-22 LAB — HIV ANTIBODY (ROUTINE TESTING W REFLEX): HIV Screen 4th Generation wRfx: NONREACTIVE

## 2021-11-22 LAB — CBC
HCT: 45.8 % (ref 39.0–52.0)
Hemoglobin: 14.5 g/dL (ref 13.0–17.0)
MCH: 27.7 pg (ref 26.0–34.0)
MCHC: 31.7 g/dL (ref 30.0–36.0)
MCV: 87.4 fL (ref 80.0–100.0)
Platelets: 312 10*3/uL (ref 150–400)
RBC: 5.24 MIL/uL (ref 4.22–5.81)
RDW: 13.1 % (ref 11.5–15.5)
WBC: 9.9 10*3/uL (ref 4.0–10.5)
nRBC: 0 % (ref 0.0–0.2)

## 2021-11-22 LAB — URINE DRUG SCREEN, QUALITATIVE (ARMC ONLY)
Amphetamines, Ur Screen: NOT DETECTED
Barbiturates, Ur Screen: NOT DETECTED
Benzodiazepine, Ur Scrn: NOT DETECTED
Cannabinoid 50 Ng, Ur ~~LOC~~: NOT DETECTED
Cocaine Metabolite,Ur ~~LOC~~: NOT DETECTED
MDMA (Ecstasy)Ur Screen: NOT DETECTED
Methadone Scn, Ur: NOT DETECTED
Opiate, Ur Screen: NOT DETECTED
Phencyclidine (PCP) Ur S: NOT DETECTED
Tricyclic, Ur Screen: POSITIVE — AB

## 2021-11-22 LAB — GLUCOSE, CAPILLARY
Glucose-Capillary: 212 mg/dL — ABNORMAL HIGH (ref 70–99)
Glucose-Capillary: 169 mg/dL — ABNORMAL HIGH (ref 70–99)

## 2021-11-22 LAB — TROPONIN I (HIGH SENSITIVITY)
Troponin I (High Sensitivity): 44 ng/L — ABNORMAL HIGH (ref <18)
Troponin I (High Sensitivity): 40 ng/L — ABNORMAL HIGH (ref <18)

## 2021-11-22 LAB — CBG MONITORING, ED: Glucose-Capillary: 244 mg/dL — ABNORMAL HIGH (ref 70–99)

## 2021-11-22 LAB — PROTIME-INR
INR: 1 (ref 0.8–1.2)
Prothrombin Time: 13.1 seconds (ref 11.4–15.2)

## 2021-11-22 LAB — HEMOGLOBIN A1C
Hgb A1c MFr Bld: 10.5 % — ABNORMAL HIGH (ref 4.8–5.6)
Mean Plasma Glucose: 254.65 mg/dL

## 2021-11-22 LAB — APTT: aPTT: 29 seconds (ref 24–36)

## 2021-11-22 LAB — MRSA NEXT GEN BY PCR, NASAL: MRSA by PCR Next Gen: NOT DETECTED

## 2021-11-22 LAB — SARS CORONAVIRUS 2 BY RT PCR: SARS Coronavirus 2 by RT PCR: NEGATIVE

## 2021-11-22 LAB — BRAIN NATRIURETIC PEPTIDE: B Natriuretic Peptide: 606.8 pg/mL — ABNORMAL HIGH (ref 0.0–100.0)

## 2021-11-22 MED ORDER — LABETALOL HCL 5 MG/ML IV SOLN
10.0000 mg | Freq: Once | INTRAVENOUS | Status: DC
  Filled 2021-11-22: qty 4

## 2021-11-22 MED ORDER — ONDANSETRON HCL 4 MG/2ML IJ SOLN
4.0000 mg | Freq: Four times a day (QID) | INTRAMUSCULAR | Status: DC | PRN
  Administered 2021-11-22: 4 mg via INTRAVENOUS
  Filled 2021-11-22: qty 2

## 2021-11-22 MED ORDER — HYDRALAZINE HCL 20 MG/ML IJ SOLN: 10.0000 mg | INTRAMUSCULAR | Status: DC | PRN

## 2021-11-22 MED ORDER — IRBESARTAN 150 MG PO TABS
300.0000 mg | ORAL_TABLET | Freq: Every day | ORAL | Status: DC
  Administered 2021-11-22 – 2021-11-26 (×5): 300 mg via ORAL
  Filled 2021-11-22 (×5): qty 2

## 2021-11-22 MED ORDER — INSULIN ASPART 100 UNIT/ML IJ SOLN
0.0000 [IU] | Freq: Every day | INTRAMUSCULAR | Status: DC
  Administered 2021-11-23: 2 [IU] via SUBCUTANEOUS
  Filled 2021-11-22: qty 1

## 2021-11-22 MED ORDER — ACETAMINOPHEN 325 MG PO TABS
650.0000 mg | ORAL_TABLET | ORAL | Status: DC | PRN
  Administered 2021-11-23 – 2021-11-25 (×5): 650 mg via ORAL
  Filled 2021-11-22 (×6): qty 2

## 2021-11-22 MED ORDER — LABETALOL HCL 5 MG/ML IV SOLN
20.0000 mg | Freq: Once | INTRAVENOUS | Status: AC
  Administered 2021-11-22: 20 mg via INTRAVENOUS

## 2021-11-22 MED ORDER — NICARDIPINE HCL IN NACL 20-0.86 MG/200ML-% IV SOLN
3.0000 mg/h | INTRAVENOUS | Status: DC
  Administered 2021-11-22: 7.5 mg/h via INTRAVENOUS
  Administered 2021-11-22: 10 mg/h via INTRAVENOUS
  Administered 2021-11-22 (×2): 12.5 mg/h via INTRAVENOUS
  Administered 2021-11-22: 5 mg/h via INTRAVENOUS
  Administered 2021-11-22 – 2021-11-23 (×3): 7.5 mg/h via INTRAVENOUS
  Administered 2021-11-23: 5 mg/h via INTRAVENOUS
  Administered 2021-11-23: 4 mg/h via INTRAVENOUS
  Administered 2021-11-23: 7.5 mg/h via INTRAVENOUS
  Administered 2021-11-23: 5 mg/h via INTRAVENOUS
  Administered 2021-11-24: 2.5 mg/h via INTRAVENOUS
  Filled 2021-11-22 (×15): qty 200

## 2021-11-22 MED ORDER — CARVEDILOL 12.5 MG PO TABS: 12.5000 mg | ORAL_TABLET | Freq: Two times a day (BID) | ORAL | Status: DC

## 2021-11-22 MED ORDER — INSULIN ASPART 100 UNIT/ML IJ SOLN
0.0000 [IU] | Freq: Three times a day (TID) | INTRAMUSCULAR | Status: DC
  Administered 2021-11-22 (×2): 7 [IU] via SUBCUTANEOUS
  Administered 2021-11-23 – 2021-11-24 (×5): 4 [IU] via SUBCUTANEOUS
  Administered 2021-11-24: 3 [IU] via SUBCUTANEOUS
  Administered 2021-11-25: 4 [IU] via SUBCUTANEOUS
  Filled 2021-11-22 (×11): qty 1

## 2021-11-22 MED ORDER — CHLORHEXIDINE GLUCONATE CLOTH 2 % EX PADS
6.0000 | MEDICATED_PAD | Freq: Every day | CUTANEOUS | Status: DC
  Administered 2021-11-22 – 2021-11-26 (×4): 6 via TOPICAL

## 2021-11-22 MED ORDER — DOCUSATE SODIUM 100 MG PO CAPS
100.0000 mg | ORAL_CAPSULE | Freq: Two times a day (BID) | ORAL | Status: DC | PRN
  Administered 2021-11-23: 100 mg via ORAL
  Filled 2021-11-22: qty 1

## 2021-11-22 MED ORDER — TORSEMIDE 20 MG PO TABS
20.0000 mg | ORAL_TABLET | Freq: Every day | ORAL | Status: DC
  Administered 2021-11-22 – 2021-11-23 (×2): 20 mg via ORAL
  Filled 2021-11-22 (×2): qty 1

## 2021-11-22 MED ORDER — PNEUMOCOCCAL 20-VAL CONJ VACC 0.5 ML IM SUSY
0.5000 mL | PREFILLED_SYRINGE | INTRAMUSCULAR | Status: AC
  Administered 2021-11-25: 0.5 mL via INTRAMUSCULAR
  Filled 2021-11-22: qty 0.5

## 2021-11-22 MED ORDER — CARVEDILOL 6.25 MG PO TABS
12.5000 mg | ORAL_TABLET | Freq: Two times a day (BID) | ORAL | Status: DC
  Administered 2021-11-22 – 2021-11-26 (×8): 12.5 mg via ORAL
  Filled 2021-11-22 (×5): qty 1
  Filled 2021-11-22 (×3): qty 2

## 2021-11-22 MED ORDER — POLYETHYLENE GLYCOL 3350 17 G PO PACK: 17.0000 g | PACK | Freq: Every day | ORAL | Status: DC | PRN

## 2021-11-22 MED ORDER — MAGNESIUM SULFATE 2 GM/50ML IV SOLN
2.0000 g | Freq: Once | INTRAVENOUS | Status: AC
  Administered 2021-11-22: 2 g via INTRAVENOUS
  Filled 2021-11-22: qty 50

## 2021-11-22 NOTE — ED Triage Notes (Addendum)
Patient ambulatory to triage with steady gait, without difficulty or distress noted; pt reports left sided CP accomp by frontal HA and "numbness" to fingers on left hand since last night; denies hx of same; st he fell last wk in parking lot due to a "depth perception" problem but unsure if he hit his head; pt reports that he has been off his meds for last yr

## 2021-11-22 NOTE — H&P (Signed)
NAME:  Jeremiah Vance, MRN:  [id number], DOB:  12/20/1970, LOS: 0 ADMISSION DATE:  11/22/2021, CONSULTATION DATE: 11/22/21 REFERRING MD: Dr. Lenard Lance , CHIEF COMPLAINT: Headache   History of Present Illness:  This is a 51 yo male who presented to [facility] ER on 08/24 with c/o frontal headache and "numbness" to the left fingers onset of symptoms last night.  He also endorses speech changes "stuttering" during dinner last night at 1900.  He also started having chest pain this morning.  Per ER notes pt reported during triage he fell last week in the parking lot, but is uncertain if he hit his head.  He states he has not taken his outpatient antihypertensives in a year and replies "I'm stubborn" regarding the reason he stopped taking them.    ED Course Upon arrival to the ER pt bp 198/110.  CT Head revealed a small acute 3 ml Right basal ganglia hemorrhage without significant mass effect or complicating features.  Neurologist Dr. Otelia Limes consulted by ER provider pt deemed appropriate for [facility] admission and  recommended bp control.  Pt started on nicardipine gtt.    Significant lab results: glucose 226, BUN 26, creatinine 1.36, calcium 8.5, BNP 606.8, and troponin 44 CXR: No acute cardiopulmonary disease Initial EKG: Normal sinus rhythm, no ST elevation    PCCM team contacted for ICU admission.  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 recent fall   Interim History / Subjective:  Pt states his headache has resolved since arrival to the ER.  He does endorse some mild chest pain, but this has also improved.  He is currently on nicardipine gtt @7 .5 mg/hr sbp 154-177  Objective   Blood pressure (!) 150/73, pulse 72, temperature 97.6 F (36.4 C), temperature source Oral, resp. rate (!) 21, height 5\' 10"  (1.778 m), weight (!) 167.8 kg, SpO2 93 %.       No intake or output data in the 24 hours ending 11/22/21 1105 Filed Weights   11/22/21 0609  Weight: (!) 167.8 kg    Examination: General: Acutely-ill appearing male, NAD on 2L O2 via nasal canula HENT: Supple, no JVD present  Lungs: Clear throughout, even, non labored  Cardiovascular: Sinus rhythm, rrr, no r/g, 2+ radial/1+ distal pulses, 2+ bilateral lower extremity edema Abdomen: +BS x4, obese, soft, non tender, non distended  Extremities: Chronic bilateral lower extremity lymphedema with erythema with scabbed over wounds  Neuro: Alert and oriented, following commands, left eye blindness, right pupil equal/round/reactive  GU: Deferred   Resolved Hospital Problem list     Assessment & Plan:  Small acute right basal ganglia hemorrhage secondary to hypertensive emergency due to medication noncompliance vs recent fall - Nicardipine gtt to maintain sbp 130-150 - Will resume outpatient amlodipine  - Avoid chemical VTE px  - Neurology consulted appreciate input  - Urine drug pending   Acute on chronic diastolic CHF~ Echo 07/2019 EF 50 to 55% Elevated troponin likely secondary to hypertensive emergency Hx: Hypercholesteremia and Ischemic Stroke - Continuous telemetry  monitoring  - Continue atorvastatin  - Will consult Cardiology appreciate input  - Lipid panel and hemoglobin A1c   Chronic kidney disease stage 3a, GFR 30-59  - Trend BMP  - Replace electrolytes as indicated  - Monitor UOP - Avoid nephrotoxic medications when able   Best  Practice (right click and "Reselect all SmartList Selections" daily)   Diet/type: Regular consistency (see orders) DVT prophylaxis: SCD GI prophylaxis: N/A Lines: N/A Foley:  N/A Code Status:  full code Last date of multidisciplinary goals of care discussion [N/A]  Labs   CBC: Recent Labs  Lab 11/22/21 0628 11/22/21 0743  WBC 9.9 9.4  NEUTROABS  --  6.3  HGB 14.5 14.0  HCT 45.8 44.6  MCV 87.4 87.6  PLT 312 [id number]    Basic Metabolic Panel: Recent Labs  Lab 11/22/21 0628  NA 136  K 4.1  CL 104  CO2 25  GLUCOSE 226*  BUN 26*  CREATININE 1.36*  CALCIUM 8.5*   GFR: Estimated Creatinine Clearance: 100.8 mL/min (A) (by C-G formula based on SCr of 1.36 mg/dL (H)). Recent Labs  Lab 11/22/21 0628 11/22/21 0743  WBC 9.9 9.4    Liver Function Tests: No results for input(s): "AST", "ALT", "ALKPHOS", "BILITOT", "PROT", "ALBUMIN" in the last 168 hours. No results for input(s): "LIPASE", "AMYLASE" in the last 168 hours. No results for input(s): "AMMONIA" in the last 168 hours.  ABG    Component Value Date/Time   TCO2 26 10/21/2017 1055     Coagulation Profile: Recent Labs  Lab 11/22/21 0628  INR 1.0    Cardiac Enzymes: No results for input(s): "CKTOTAL", "CKMB", "CKMBINDEX", "TROPONINI" in the last 168 hours.  HbA1C: HbA1c, POC (controlled diabetic range)  Date/Time Value Ref Range Status  06/03/2018 09:03 AM 7.0 0.0 - 7.0 % Final  12/16/2017 01:50 PM 5.4 0.0 - 7.0 % Final   Hgb A1c MFr Bld  Date/Time Value Ref Range Status  05/19/2020 11:39 AM 12.1 (H) 4.8 - 5.6 % Final    Comment:             Prediabetes: 5.7 - 6.4          Diabetes: >6.4          Glycemic control for adults with diabetes: <7.0   02/20/2020 05:14 AM 12.0 (H) 4.8 - 5.6 % Final    Comment:    (NOTE) Pre diabetes:          5.7%-6.4%  Diabetes:              >6.4%  Glycemic control for   <7.0% adults with diabetes     CBG: No results for input(s): "GLUCAP" in the last 168  hours.  Review of Systems: Positives in BOLD   Gen: Denies fever, chills, weight change, fatigue, night sweats HEENT: Denies blurred vision, double vision, hearing loss, tinnitus, sinus congestion, rhinorrhea, sore throat, neck stiffness, dysphagia PULM: Denies shortness of breath, cough, sputum production, hemoptysis, wheezing CV: Denies chest pain, edema, orthopnea, paroxysmal nocturnal dyspnea, palpitations GI: Denies abdominal pain, nausea, vomiting, diarrhea, hematochezia, melena, constipation, change in bowel habits GU: Denies dysuria, hematuria, polyuria, oliguria, urethral discharge Endocrine: Denies hot or cold intolerance, polyuria, polyphagia or appetite change Derm: Denies rash, dry skin, scaling or peeling skin change Heme: Denies easy bruising, bleeding, bleeding gums Neuro: headache, fall, stuttering speech, left numbness, weakness, slurred speech, loss of memory or consciousness  Past Medical History:  He,  has a past medical history of Acute ischemic  stroke (HCC) (09/06/2017), Acute renal failure superimposed on stage 3a chronic kidney disease (HCC) (07/08/2019), Acute right-sided weakness, Allergies, Anasarca, Anemia (07/10/2017), Arthritis, CHF (congestive heart failure) (HCC), Chicken pox, CKD (chronic kidney disease) stage 3, GFR 30-59 ml/min (HCC) (06/2017), Depression, Elevated troponin (07/08/2019), High cholesterol, History of cardiomyopathy, Hyperbilirubinemia (07/10/2017), Hypertension, Ischemic stroke (HCC), Morbid obesity (HCC), Normocytic anemia (12/16/2017), Recurrent incisional hernia with incarceration s/p repair 10/22/2017 (10/21/2017), Stroke (HCC) (08/2017), and Type 2 diabetes mellitus (HCC).   Surgical History:   Past Surgical History:  Procedure Laterality Date   ABDOMINAL HERNIA REPAIR  2008; 10/22/2017   "scope; OPEN REPAIR INCARCERATED VENTRAL HERNIA   HERNIA REPAIR     KNEE ARTHROSCOPY Right 1989   VENTRAL HERNIA REPAIR N/A 10/22/2017   Procedure: OPEN REPAIR  INCARCERATED VENTRAL HERNIA;  Surgeon: Glenna Fellows, MD;  Location: [facility] OR;  Service: General;  Laterality: N/A;     Social History:   reports that he has never smoked. He has never used smokeless tobacco. He reports that he does not currently use alcohol. He reports that he does not use drugs.   Family History:  His family history includes Arthritis in his father, mother, paternal grandfather, and paternal grandmother; Depression in his maternal grandmother, mother, and sister; Diabetes in his father, maternal grandmother, mother, sister, and sister; Hearing loss in his father and paternal grandmother; Heart attack in his father and paternal grandfather; Heart disease in his father, maternal grandmother, mother, and paternal grandfather; Hyperlipidemia in his father, maternal grandfather, and maternal grandmother; Hypertension in his father, maternal grandfather, maternal grandmother, mother, and sister; Learning disabilities in his mother; Mental illness in his mother and sister; Stroke in his father, maternal grandfather, maternal grandmother, mother, and paternal grandfather.   Allergies Allergies  Allergen Reactions   Sulfa Antibiotics    Pollen Extract Other (See Comments)     Home Medications  Prior to Admission medications   Medication Sig Start Date End Date Taking? Authorizing Provider  amLODipine (NORVASC) 2.5 MG tablet Take 1 tablet (2.5 mg total) by mouth daily. 05/19/20   Flinchum, Eula Fried, FNP  atorvastatin (LIPITOR) 40 MG tablet Take 1 tablet (40 mg total) by mouth daily at 6 PM. 09/20/19   Delma Freeze, FNP  carvedilol (COREG) 25 MG tablet Take 1 tablet (25 mg total) by mouth 2 (two) times daily with a meal. Start after you run out of Losartan. 03/16/20   Debbe Odea, MD  empagliflozin (JARDIANCE) 10 MG TABS tablet Take 1 tablet (10 mg total) by mouth daily before breakfast. 02/26/20   Rhetta Mura, MD  insulin degludec (TRESIBA FLEXTOUCH) 200 UNIT/ML  FlexTouch Pen Inject 10 Units into the skin daily. 03/09/20   Glori Luis, MD  irbesartan (AVAPRO) 300 MG tablet Take 1 tablet (300 mg total) by mouth daily. 03/16/20   Debbe Odea, MD  torsemide (DEMADEX) 20 MG tablet Take 2 tablets (40 mg total) by mouth daily. 03/09/20   Sondra Barges, PA-C     Critical care time: 55 minutes      Zada Girt, AGNP  Pulmonary/Critical Care Pager [phone] (please enter 7 digits) PCCM Consult Pager [phone] (please enter 7 digits)

## 2021-11-22 NOTE — Consult Note (Addendum)
Cardiology Consultation:   Patient ID: Jeremiah Vance MRN: [id number]; DOB: [birth year]  Admit date: 11/22/2021 Date of Consult: 11/22/2021  PCP:  Doreen Beam, FNP (Inactive)   [facility] Providers Cardiologist:  Kate Sable, MD  Physician requesting consult: Dr. Mortimer Fries Reason for consult: Hypertensive urgency  Patient Profile:   Jeremiah Vance is a 51 y.o. male with a hx of medication noncompliance, hypertension, morbid obesity, chronic diastolic CHF, poorly controlled diabetes type 2, prior stroke x2, sleep apnea noncompliant with CPAP, chronic lower extremity edema, chronic kidney disease  History of Present Illness:   Jeremiah Vance last seen in cardiology clinic 2021, seen in [place] clinic 2022 Previously recommended to take carvedilol, hydralazine, losartan, torsemide, Jardiance On prior clinic visits December 2021, blood pressure poorly controlled, noncompliance suspected. He reports that he has not had insurance for over 1 year, has not been taking medications for 1 year, ran out of all of his medications " Now has insurance ,looking for primary care"  Presenting to the emergency room early this morning with headache, dizziness, speech change since night before, numbness to fingers on left hand starting August 23 Reports a fall in a parking lot week earlier but did not hit his head  CT scan head showing small acute 3 mm right basal ganglia hemorrhage Blood pressure in the ER 198/110 He was started on nicardipine infusion Blood pressure on my rounds 150/80 Apart from headache, had no complaints  Outpatient medication list including irbesartan 300 daily, Coreg 25 twice daily, amlodipine 2.5 daily  Past Medical History:  Diagnosis Date   Acute ischemic stroke ([place]) 09/06/2017   Acute renal failure superimposed on stage 3a chronic kidney disease ([place]) 07/08/2019   Acute right-sided weakness    Allergies    Anasarca    Anemia 07/10/2017   Arthritis    CHF  (congestive heart failure) ([place])    "coinsided w/kidney problems I was having 06/2017"   Chicken pox    CKD (chronic kidney disease) stage 3, GFR 30-59 ml/min (HCC) 06/2017   Depression    Elevated troponin 07/08/2019   High cholesterol    History of cardiomyopathy    LVEF 40 to 45% in April 2019 - subsequently normalized   Hyperbilirubinemia 07/10/2017   Hypertension    Ischemic stroke ([place])    Small left internal capsule infarct due to lacunar disease   Morbid obesity ([place])    Normocytic anemia 12/16/2017   Recurrent incisional hernia with incarceration s/p repair 10/22/2017 10/21/2017   Stroke ([place]) 08/2017   "right sided weakness since; getting stronger though" (10/22/2017)   Type 2 diabetes mellitus ([place])     Past Surgical History:  Procedure Laterality Date   ABDOMINAL HERNIA REPAIR  2008; 10/22/2017   "scope; OPEN REPAIR INCARCERATED VENTRAL HERNIA   HERNIA REPAIR     KNEE ARTHROSCOPY Right 1989   VENTRAL HERNIA REPAIR N/A 10/22/2017   Procedure: OPEN REPAIR INCARCERATED VENTRAL HERNIA;  Surgeon: Excell Seltzer, MD;  Location: [place];  Service: General;  Laterality: N/A;     Home Medications:  Reports not currently taking any medications, list detailed below is old list of outpatient medications  Prior to Admission medications   Medication Sig Start Date End Date Taking? Authorizing Provider  amLODipine (NORVASC) 2.5 MG tablet Take 1 tablet (2.5 mg total) by mouth daily. Patient not taking: Reported on 11/22/2021 05/19/20   Flinchum, Kelby Aline, FNP  atorvastatin (LIPITOR) 40 MG tablet Take 1 tablet (40 mg total) by mouth  daily at 6 PM. Patient not taking: Reported on 11/22/2021 09/20/19   Delma Freeze, FNP  carvedilol (COREG) 25 MG tablet Take 1 tablet (25 mg total) by mouth 2 (two) times daily with a meal. Start after you run out of Losartan. Patient not taking: Reported on 11/22/2021 03/16/20   Debbe Odea, MD  empagliflozin (JARDIANCE) 10 MG TABS tablet Take 1  tablet (10 mg total) by mouth daily before breakfast. Patient not taking: Reported on 11/22/2021 02/26/20   Rhetta Mura, MD  insulin degludec (TRESIBA FLEXTOUCH) 200 UNIT/ML FlexTouch Pen Inject 10 Units into the skin daily. Patient not taking: Reported on 11/22/2021 03/09/20   Glori Luis, MD  irbesartan (AVAPRO) 300 MG tablet Take 1 tablet (300 mg total) by mouth daily. Patient not taking: Reported on 11/22/2021 03/16/20   Debbe Odea, MD  torsemide (DEMADEX) 20 MG tablet Take 2 tablets (40 mg total) by mouth daily. Patient not taking: Reported on 11/22/2021 03/09/20   Sondra Barges, PA-C    Inpatient Medications: Scheduled Meds:  Chlorhexidine Gluconate Cloth  6 each Topical Daily   insulin aspart  0-20 Units Subcutaneous TID WC   insulin aspart  0-5 Units Subcutaneous QHS   [START ON 11/23/2021] pneumococcal 20-valent conjugate vaccine  0.5 mL Intramuscular Tomorrow-1000   Continuous Infusions:  niCARDipine 7.5 mg/hr (11/22/21 1523)   PRN Meds: acetaminophen, docusate sodium, hydrALAZINE, ondansetron (ZOFRAN) IV, polyethylene glycol  Allergies:    Allergies  Allergen Reactions   Sulfa Antibiotics    Pollen Extract Other (See Comments)    Social History:   Social History   Socioeconomic History   Marital status: Legally Separated    Spouse name: Not on file   Number of children: Not on file   Years of education: Not on file   Highest education level: Not on file  Occupational History   Not on file  Tobacco Use   Smoking status: Never   Smokeless tobacco: Never  Vaping Use   Vaping Use: Never used  Substance and Sexual Activity   Alcohol use: Not Currently   Drug use: Never   Sexual activity: Not Currently  Other Topics Concern   Not on file  Social History Narrative   He lives with his sister , Jeremiah Vance and she is his MPOA after his stokes   Social Determinants of Health   Financial Resource Strain: High Risk (03/20/2020)   Overall  Financial Resource Strain (CARDIA)    Difficulty of Paying Living Expenses: Hard  Food Insecurity: Not on file  Transportation Needs: Unmet Transportation Needs (03/09/2020)   PRAPARE - Administrator, Civil Service (Medical): Yes    Lack of Transportation (Non-Medical): Yes  Physical Activity: Not on file  Stress: Not on file  Social Connections: Not on file  Intimate Partner Violence: Not on file    Family History:    Family History  Problem Relation Age of Onset   Diabetes Father    Heart disease Father    Arthritis Father    Hearing loss Father    Hyperlipidemia Father    Heart attack Father    Hypertension Father    Stroke Father    Diabetes Sister    Diabetes Mother    Stroke Mother    Arthritis Mother    Depression Mother    Heart disease Mother    Hypertension Mother    Learning disabilities Mother    Mental illness Mother    Diabetes Maternal Grandmother  Heart disease Maternal Grandmother    Depression Maternal Grandmother    Hyperlipidemia Maternal Grandmother    Hypertension Maternal Grandmother    Stroke Maternal Grandmother    Stroke Paternal Grandfather    Heart disease Paternal Grandfather    Arthritis Paternal Grandfather    Heart attack Paternal Grandfather    Hypertension Maternal Grandfather    Hyperlipidemia Maternal Grandfather    Stroke Maternal Grandfather    Arthritis Paternal Grandmother    Hearing loss Paternal Grandmother    Depression Sister    Diabetes Sister    Hypertension Sister    Mental illness Sister      ROS:  Please see the history of present illness.  Review of Systems  Constitutional: Negative.   HENT: Negative.    Respiratory: Negative.    Cardiovascular: Negative.   Gastrointestinal: Negative.   Musculoskeletal: Negative.   Neurological:  Positive for headaches.  Psychiatric/Behavioral: Negative.    All other systems reviewed and are negative.   Physical Exam/Data:   Vitals:   11/22/21 1300  11/22/21 1311 11/22/21 1400 11/22/21 1500  BP: (!) 141/79 (!) 157/57 (!) 156/79 (!) 160/82  Pulse: 75 72 79 73  Resp: (!) 35 (!) 32 20 (!) 38  Temp:      TempSrc:      SpO2: 98% 93% 99% (!) 87%  Weight:      Height:       No intake or output data in the 24 hours ending 11/22/21 1654    11/22/2021    6:09 AM 06/21/2020   10:16 AM 06/09/2020   11:07 AM  Last 3 Weights  Weight (lbs) 370 lb 378 lb 2 oz 380 lb 9.6 oz  Weight (kg) 167.831 kg 171.516 kg 172.639 kg     Body mass index is 53.09 kg/m.  General:  Well nourished, well developed, in no acute distress obese HEENT: normal Neck: no JVD Vascular: No carotid bruits; Distal pulses 2+ bilaterally Cardiac:  normal S1, S2; RRR; no murmur  Trace to 1+ minimally pitting lower extremity edema, chronic stasis skin changes Lungs:  clear to auscultation bilaterally, no wheezing, rhonchi or rales  Abd: soft, nontender, no hepatomegaly  Ext: no edema Musculoskeletal:  No deformities, BUE and BLE strength normal and equal Skin: warm and dry  Neuro:  CNs 2-12 intact, no focal abnormalities noted Psych:  Normal affect   EKG:  The EKG was personally reviewed and demonstrates:   Normal sinus rhythm rate 79 bpm poor R wave progression to the anterior precordial leads, left axis deviation, LVH  Telemetry:  Telemetry was personally reviewed and demonstrates:   Normal sinus rhythm  Relevant CV Studies:   Laboratory Data:  High Sensitivity Troponin:   Recent Labs  Lab 11/22/21 0628 11/22/21 0837  TROPONINIHS 44* 40*     Chemistry Recent Labs  Lab 11/22/21 0628 11/22/21 1158  NA 136  --   K 4.1  --   CL 104  --   CO2 25  --   GLUCOSE 226*  --   BUN 26*  --   CREATININE 1.36*  --   CALCIUM 8.5*  --   MG  --  1.8  GFRNONAA >60  --   ANIONGAP 7  --     Recent Labs  Lab 11/22/21 1158  PROT 7.2  ALBUMIN 3.1*  AST 16  ALT 15  ALKPHOS 70  BILITOT 1.2   Lipids  Recent Labs  Lab 11/22/21 1158  CHOL 162  TRIG 135   HDL 30*  LDLCALC 105*  CHOLHDL 5.4    Hematology Recent Labs  Lab 11/22/21 0628 11/22/21 0743  WBC 9.9 9.4  RBC 5.24 5.09  HGB 14.5 14.0  HCT 45.8 44.6  MCV 87.4 87.6  MCH 27.7 27.5  MCHC 31.7 31.4  RDW 13.1 13.1  PLT 312 305   Thyroid No results for input(s): "TSH", "FREET4" in the last 168 hours.  BNP Recent Labs  Lab 11/22/21 0628  BNP 606.8*    DDimer No results for input(s): "DDIMER" in the last 168 hours.   Radiology/Studies:  DG Chest 2 View  Result Date: 11/22/2021 CLINICAL DATA:  51 year old male with history of chest pain. EXAM: CHEST - 2 VIEW COMPARISON:  Chest x-ray 02/22/2020. FINDINGS: Lung volumes are low. No consolidative airspace disease. No pleural effusions. No pneumothorax. No pulmonary nodule or mass noted. Crowding of the pulmonary vasculature accentuated by low lung volumes, without frank pulmonary edema. Cardiomediastinal silhouette is within normal limits. IMPRESSION: 1. Low lung volumes without radiographic evidence of acute cardiopulmonary disease. Electronically Signed   By: Vinnie Langton M.D.   On: 11/22/2021 07:31   CT Head Wo Contrast  Addendum Date: 11/22/2021   ADDENDUM REPORT: 11/22/2021 07:22 ADDENDUM: Study discussed by telephone with Dr. Kerman Passey on 11/22/2021 at 715 hours. Electronically Signed   By: Genevie Ann M.D.   On: 11/22/2021 07:22   Result Date: 11/22/2021 CLINICAL DATA:  51 year old male with headache, left side chest pain. EXAM: CT HEAD WITHOUT CONTRAST TECHNIQUE: Contiguous axial images were obtained from the base of the skull through the vertex without intravenous contrast. RADIATION DOSE REDUCTION: This exam was performed according to the departmental dose-optimization program which includes automated exposure control, adjustment of the mA and/or kV according to patient size and/or use of iterative reconstruction technique. COMPARISON:  Head CT 10/24/2019.  Brain MRI 08/06/2018. FINDINGS: Brain: Small hyperdense hemorrhage  in the right lentiform nuclei, oval and encompassing 21 x 12 x 22 mm (AP by transverse by CC) for an estimated blood volume of 3 mL. Acute blood is tracking into the subcortical white matter of the frontal operculum (series 4, image 38). No intraventricular or extra-axial extension. No significant intracranial mass effect or midline shift. No ventriculomegaly. Superimposed chronic lacunar infarcts in the bilateral thalami and left basal ganglia. Patchy additional white matter hypodensity. No superimposed cortically based acute infarct identified. Basilar cisterns remain normal. Vascular: Calcified atherosclerosis at the skull base. No suspicious intracranial vascular hyperdensity. Skull: No acute osseous abnormality identified. Sinuses/Orbits: Visualized paranasal sinuses and mastoids are clear. Other: Visualized orbits and scalp soft tissues are within normal limits. IMPRESSION: 1. Positive for a small acute 3 mL Right Basal Ganglia Hemorrhage. No significant intracranial mass effect or complicating features. 2. Advanced underlying chronic small vessel disease. Electronically Signed: By: Genevie Ann M.D. On: 11/22/2021 07:09     Assessment and Plan:   Hypertensive urgency Has not been taking his medications for approximately 1 year he reports Presenting with markedly elevated pressures, headache, ganglia hemorrhage -Blood pressure improved on nicardipine infusion -Once clinically stable, could reinitiate his outpatient regiment Could reinitiate ARB/irbesartan 300 daily, carvedilol at lower dose 12.5 twice daily Would avoid calcium channel blocker/amlodipine in the setting of chronic leg swelling  History of chronic diastolic CHF Chronic leg edema Once clinically stable could reinitiate outpatient diuretic torsemide  Could be initiated at lower dose 20 mg daily with close monitoring of blood pressure  Chronic renal insufficiency In the setting of  poorly controlled hypertension, poorly controlled  diabetes type2 Appears to be at his baseline, currently 1.36  Well-controlled diabetes type 2 In the setting of medication noncompliance A1c greater than 10 Long history of medication noncompliance and poor control  Morbid obesity He would benefit from consultation with nutritionist Does not appear particularly motivated  Leg swelling Likely component of lymphedema Skin changes consistent with chronic stasis Recommend compression hose, he reports "they hurt too much"    Total encounter time more than 80 minutes  Greater than 50% was spent in counseling and coordination of care with the patient   For questions or updates, please contact [facility] Please consult [email or web address] for contact info under    Signed, Julien Nordmann, MD  11/22/2021 4:54 PM

## 2021-11-22 NOTE — Progress Notes (Signed)
PHARMACY CONSULT NOTE - FOLLOW UP  Pharmacy Consult for Electrolyte Monitoring and Replacement   Recent Labs: Potassium (mmol/L)  Date Value  11/22/2021 4.1   Magnesium (mg/dL)  Date Value  16/12/9602 2.0   Calcium (mg/dL)  Date Value  54/11/8117 8.5 (L)   Albumin (g/dL)  Date Value  14/78/2956 3.6 (L)   Sodium (mmol/L)  Date Value  11/22/2021 136  05/19/2020 137     Assessment:  51 y.o. male with a past medical history of prior CVA in 2019, CHF, CKD, hypertension, hyperlipidemia, obesity, diabetes, presents to the emergency department with a headache and chest pain. Pharmacy is asked to follow and replace electrolytes while in CCU   Goal of Therapy:  Electrolytes WNL  Plan:  No electrolyte replacement warranted for today Recheck electrolytes in am  Lowella Bandy ,PharmD Clinical Pharmacist 11/22/2021 11:01 AM

## 2021-11-22 NOTE — ED Notes (Signed)
Pt brought to triage area by CT tech who sts she attempted to call MD without answer due to abnormal scan. Pt taken to RM 10 by writer and MD made aware. While placing IV pt reports he fell 1 week ago but sts he did not hit his head. Pts friend at bedside sts that pt has been c/o dizziness and HA and she noticed pts speech change at dinner last night at 1900 with pt stuttering. Pt sts he has had 2 previous CVA's and also sts he has stopped taking all prescribed medications with his reason being stated as, "Im stubborn."

## 2021-11-22 NOTE — ED Provider Notes (Signed)
[facility] Provider Note    Event Date/Time   First MD Initiated Contact with Patient 11/22/21 [phone]     (approximate)  History   Chief Complaint: Chest Pain  HPI  Jeremiah Vance is a 51 y.o. male with a past medical history of prior CVA in 2019, CHF, CKD, hypertension, hyperlipidemia, obesity, diabetes, presents to the emergency department with a headache and chest pain.  According to the patient since last night he has been experiencing a headache and the sister reports a stuttering type speech at times.  Patient denies any weakness or numbness of any arm or leg.  He states since this morning he has been feeling a mild discomfort in his chest as well.  Patient states he has not been taking any of his medications times several months at least now.  Blood pressure currently 198/110.  Denies any shortness of breath or nausea.  No diaphoresis.  Physical Exam   Triage Vital Signs: ED Triage Vitals  Enc Vitals Group     BP 11/22/21 0613 (!) 198/110     Pulse Rate 11/22/21 0613 84     Resp 11/22/21 0613 18     Temp 11/22/21 0613 98 F (36.7 C)     Temp Source 11/22/21 0613 Oral     SpO2 11/22/21 0613 92 %     Weight 11/22/21 0609 (!) 370 lb (167.8 kg)     Height 11/22/21 0609 5\' 10"  (1.778 m)     Head Circumference --      Peak Flow --      Pain Score 11/22/21 0609 6     Pain Loc --      Pain Edu? --      Excl. in GC? --     Most recent vital signs: Vitals:   11/22/21 0613  BP: (!) 198/110  Pulse: 84  Resp: 18  Temp: 98 F (36.7 C)  SpO2: 92%    General: Awake, no distress.  CV:  Good peripheral perfusion.  Regular rate and rhythm  Resp:  Normal effort.  Equal breath sounds bilaterally.  Abd:  No distention.  Soft, nontender.  No rebound or guarding. Other:  Patient has lower extremity edema with signs of chronic venous stasis.  ED Results / Procedures / Treatments   RADIOLOGY  I have reviewed the CT head images.  On my interpretation  the patient appears to have a small bleed in the right brain. Radiology has read a 3 mm acute bleed in the right basal ganglia. Chest x-ray does not appear to show any concerning or acute finding   MEDICATIONS ORDERED IN ED: Medications  labetalol (NORMODYNE) injection 20 mg (has no administration in time range)    IMPRESSION / MDM / ASSESSMENT AND PLAN / ED COURSE  I reviewed the triage vital signs and the nursing notes.  Patient's presentation is most consistent with acute presentation with potential threat to life or bodily function.  Patient presents to the emergency department for headache since last night as well as a stuttering type speech at times per sister (not witnessed during my examination).  Patient also states chest pain this morning.  CT scan of the head shows a 3 mL bleed in the right basal ganglia.  Chest x-ray is clear, lab work shows a normal CBC, reassuring chemistry with mild hyperglycemia and mild renal insufficiency.  Troponin is slightly elevated.  Given the CT head findings I spoke to Dr. 11/24/21 of neurosurgery, states  nothing from a neurosurgical standpoint needed but we will discuss with neurology.  Patient is not taking any medications including antihypertensives is not on any blood thinners.  We will dose 20 mg of IV labetalol for blood pressure control patient may need a nicardipine drip.  We will discuss with neurology for further recommendations patient will need admission to the hospital whether here or transferred to another facility.  I spoke to Dr. Otelia Limes of neurology, he will be down to see the patient.  Agrees with blood pressure control.  He also believes the patient could be safely managed at our facility. I spoke to Dr. Belia Heman of the intensive care unit, he will be admitting to his service for further treatment.  Patient's lab work shows a slight troponin elevation however largely unchanged from historical values.  Repeat troponin is largely unchanged.   CBC shows no concerning findings, chemistry is largely nonrevealing as well.  PT and PTT are normal.  CRITICAL CARE Performed by: Minna Antis   Total critical care time: 45 minutes  Critical care time was exclusive of separately billable procedures and treating other patients.  Critical care was necessary to treat or prevent imminent or life-threatening deterioration.  Critical care was time spent personally by me on the following activities: development of treatment plan with patient and/or surrogate as well as nursing, discussions with consultants, evaluation of patient's response to treatment, examination of patient, obtaining history from patient or surrogate, ordering and performing treatments and interventions, ordering and review of laboratory studies, ordering and review of radiographic studies, pulse oximetry and re-evaluation of patient's condition.   FINAL CLINICAL IMPRESSION(S) / ED DIAGNOSES   Hemorrhagic CVA ICH Hypertension    Note:  This document was prepared using Dragon voice recognition software and may include unintentional dictation errors.   Minna Antis, MD 11/22/21 1115

## 2021-11-22 NOTE — Consult Note (Signed)
NEURO HOSPITALIST CONSULT NOTE   Requesting physician: Dr. Kerman Passey  Reason for Consult: Right basal ganglia ICH  History obtained from:  Patient and Chart     HPI:                                                                                                                                          Jeremiah Vance is an 51 y.o. male with a PMHx of 2 prior strokes, CKD, anemia, arthritis, CHF, Chicken pox, hypercholesterolemia, cardiomyopathy, hyperbilirubinemia, HTN, morbid obesity and DM2 who presented to the ED this morning with left sided CP accompanied by frontal HA and "numbness" to fingers of left hand since last night. He stated that he fell last week in a parking lot due to a "depth perception" problem but was unsure if he hit his head. He was noted to be ambulatory to triage with a steady gait, without difficulty or distress noted. He did report that he has been off his meds for the last year.  CT head revealed a small right basal ganglia ICH with a small rim of adjacent vasogenic edema. The appearance is most consistent with a hypertensive hemorrhage.   The patient's friend at bedside stated that he has been c/o dizziness and HA and she noticed his speech change at dinner last night at 1900 with stuttering.   Past Medical History:  Diagnosis Date   Acute ischemic stroke ([place]) 09/06/2017   Acute renal failure superimposed on stage 3a chronic kidney disease ([place]) 07/08/2019   Acute right-sided weakness    Allergies    Anasarca    Anemia 07/10/2017   Arthritis    CHF (congestive heart failure) ([place])    "coinsided w/kidney problems I was having 06/2017"   Chicken pox    CKD (chronic kidney disease) stage 3, GFR 30-59 ml/min (HCC) 06/2017   Depression    Elevated troponin 07/08/2019   High cholesterol    History of cardiomyopathy    LVEF 40 to 45% in April 2019 - subsequently normalized   Hyperbilirubinemia 07/10/2017   Hypertension    Ischemic stroke ([place])     Small left internal capsule infarct due to lacunar disease   Morbid obesity ([place])    Normocytic anemia 12/16/2017   Recurrent incisional hernia with incarceration s/p repair 10/22/2017 10/21/2017   Stroke ([place]) 08/2017   "right sided weakness since; getting stronger though" (10/22/2017)   Type 2 diabetes mellitus ([place])     Past Surgical History:  Procedure Laterality Date   ABDOMINAL HERNIA REPAIR  2008; 10/22/2017   "scope; OPEN REPAIR INCARCERATED VENTRAL HERNIA   HERNIA REPAIR     KNEE ARTHROSCOPY Right 1989   VENTRAL HERNIA REPAIR N/A 10/22/2017   Procedure: OPEN REPAIR INCARCERATED VENTRAL HERNIA;  Surgeon: Excell Seltzer,  Sharlet Salina, MD;  Location: [facility] OR;  Service: General;  Laterality: N/A;    Family History  Problem Relation Age of Onset   Diabetes Father    Heart disease Father    Arthritis Father    Hearing loss Father    Hyperlipidemia Father    Heart attack Father    Hypertension Father    Stroke Father    Diabetes Sister    Diabetes Mother    Stroke Mother    Arthritis Mother    Depression Mother    Heart disease Mother    Hypertension Mother    Learning disabilities Mother    Mental illness Mother    Diabetes Maternal Grandmother    Heart disease Maternal Grandmother    Depression Maternal Grandmother    Hyperlipidemia Maternal Grandmother    Hypertension Maternal Grandmother    Stroke Maternal Grandmother    Stroke Paternal Grandfather    Heart disease Paternal Grandfather    Arthritis Paternal Grandfather    Heart attack Paternal Grandfather    Hypertension Maternal Grandfather    Hyperlipidemia Maternal Grandfather    Stroke Maternal Grandfather    Arthritis Paternal Grandmother    Hearing loss Paternal Grandmother    Depression Sister    Diabetes Sister    Hypertension Sister    Mental illness Sister              Social History:  reports that he has never smoked. He has never used smokeless tobacco. He reports that he does not currently use alcohol.  He reports that he does not use drugs.  Allergies  Allergen Reactions   Sulfa Antibiotics    Pollen Extract Other (See Comments)    MEDICATIONS:                                                                                                                     Prior to Admission:  Medications Prior to Admission  Medication Sig Dispense Refill Last Dose   amLODipine (NORVASC) 2.5 MG tablet Take 1 tablet (2.5 mg total) by mouth daily. (Patient not taking: Reported on 11/22/2021) 90 tablet 0 Not Taking   atorvastatin (LIPITOR) 40 MG tablet Take 1 tablet (40 mg total) by mouth daily at 6 PM. (Patient not taking: Reported on 11/22/2021) 90 tablet 3 Not Taking   carvedilol (COREG) 25 MG tablet Take 1 tablet (25 mg total) by mouth 2 (two) times daily with a meal. Start after you run out of Losartan. (Patient not taking: Reported on 11/22/2021) 180 tablet 3 Not Taking   empagliflozin (JARDIANCE) 10 MG TABS tablet Take 1 tablet (10 mg total) by mouth daily before breakfast. (Patient not taking: Reported on 11/22/2021) 30 tablet 1 Not Taking   insulin degludec (TRESIBA FLEXTOUCH) 200 UNIT/ML FlexTouch Pen Inject 10 Units into the skin daily. (Patient not taking: Reported on 11/22/2021) 3 mL 0 Not Taking   irbesartan (AVAPRO) 300 MG tablet Take 1 tablet (300 mg total) by mouth daily. (Patient not taking:  Reported on 11/22/2021) 90 tablet 3 Not Taking   torsemide (DEMADEX) 20 MG tablet Take 2 tablets (40 mg total) by mouth daily. (Patient not taking: Reported on 11/22/2021) 90 tablet 3 Not Taking   Scheduled:  carvedilol  12.5 mg Oral BID WC   Chlorhexidine Gluconate Cloth  6 each Topical Daily   insulin aspart  0-20 Units Subcutaneous TID WC   insulin aspart  0-5 Units Subcutaneous QHS   irbesartan  300 mg Oral Daily   [START ON 11/23/2021] pneumococcal 20-valent conjugate vaccine  0.5 mL Intramuscular Tomorrow-1000   torsemide  20 mg Oral Daily   Continuous:  niCARDipine 12.5 mg/hr (11/22/21 2216)     ROS:                                                                                                                                       The patient feels tired, fatigued and in pain and is reluctant to provide information regarding his symptoms tonight. He denies any weakness.    Blood pressure (!) 192/115, pulse 63, temperature 98 F (36.7 C), temperature source Oral, resp. rate (!) 26, height 5\' 10"  (1.778 m), weight (!) 167.8 kg, SpO2 94 %.   General Examination:                                                                                                       Physical Exam  HEENT-  [place]/AT   Lungs - Respirations unlabored Extremities- Nonpitting edema with skin thickening, sores and discoloration to distal lower extremities.    Neurological Examination Mental Status: Awake, alert and oriented. Speech fluent with intact comprehension and naming.  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 Left eye with chronically decreased acuity and patient counts fingers correctly on some trials but incorrectly on others. PERRL.  III,IV, VI: Gazes to left and right without difficulty. No nystagmus.  V: FT sensation equal bilaterally VII: Grimace is symmetric.  VIII: Hearing intact to voice IX,X: No hypophonia or hoarseness XI: Head is midline XII: Midline tongue extension Motor: RUE 5/5 proximally and distally except for 4+/5 grip strength LUE 5/5 proximally and distally RLE 4/5 HF with pain limiting effort. 5/5 KE LLE 4/5 HF with pain limiting effort. 5/5 KE No pronator drift Sensory: FT intact throughout, bilaterally. No extinction to DSS.  Deep Tendon Reflexes: 1+ bilateral brachioradialis. Otherwise deferred due to pain.  Plantars: Deferred due to pedal edema and leg  discomfort Cerebellar: No ataxia with FNF bilaterally  Gait: Deferred in the ICU setting   Lab Results: Basic Metabolic Panel: Recent Labs  Lab 11/22/21 0628   NA 136  K 4.1  CL 104  CO2 25  GLUCOSE 226*  BUN 26*  CREATININE 1.36*  CALCIUM 8.5*    CBC: Recent Labs  Lab 11/22/21 0628 11/22/21 0743  WBC 9.9 9.4  NEUTROABS  --  6.3  HGB 14.5 14.0  HCT 45.8 44.6  MCV 87.4 87.6  PLT 312 305    Cardiac Enzymes: No results for input(s): "CKTOTAL", "CKMB", "CKMBINDEX", "TROPONINI" in the last 168 hours.  Lipid Panel: No results for input(s): "CHOL", "TRIG", "HDL", "CHOLHDL", "VLDL", "LDLCALC" in the last 168 hours.  Imaging: DG Chest 2 View  Result Date: 11/22/2021 CLINICAL DATA:  51 year old male with history of chest pain. EXAM: CHEST - 2 VIEW COMPARISON:  Chest x-ray 02/22/2020. FINDINGS: Lung volumes are low. No consolidative airspace disease. No pleural effusions. No pneumothorax. No pulmonary nodule or mass noted. Crowding of the pulmonary vasculature accentuated by low lung volumes, without frank pulmonary edema. Cardiomediastinal silhouette is within normal limits. IMPRESSION: 1. Low lung volumes without radiographic evidence of acute cardiopulmonary disease. Electronically Signed   By: Vinnie Langton M.D.   On: 11/22/2021 07:31   CT Head Wo Contrast  Addendum Date: 11/22/2021   ADDENDUM REPORT: 11/22/2021 07:22 ADDENDUM: Study discussed by telephone with Dr. Kerman Passey on 11/22/2021 at 715 hours. Electronically Signed   By: Genevie Ann M.D.   On: 11/22/2021 07:22   Result Date: 11/22/2021 CLINICAL DATA:  51 year old male with headache, left side chest pain. EXAM: CT HEAD WITHOUT CONTRAST TECHNIQUE: Contiguous axial images were obtained from the base of the skull through the vertex without intravenous contrast. RADIATION DOSE REDUCTION: This exam was performed according to the departmental dose-optimization program which includes automated exposure control, adjustment of the mA and/or kV according to patient size and/or use of iterative reconstruction technique. COMPARISON:  Head CT 10/24/2019.  Brain MRI 08/06/2018. FINDINGS: Brain:  Small hyperdense hemorrhage in the right lentiform nuclei, oval and encompassing 21 x 12 x 22 mm (AP by transverse by CC) for an estimated blood volume of 3 mL. Acute blood is tracking into the subcortical white matter of the frontal operculum (series 4, image 38). No intraventricular or extra-axial extension. No significant intracranial mass effect or midline shift. No ventriculomegaly. Superimposed chronic lacunar infarcts in the bilateral thalami and left basal ganglia. Patchy additional white matter hypodensity. No superimposed cortically based acute infarct identified. Basilar cisterns remain normal. Vascular: Calcified atherosclerosis at the skull base. No suspicious intracranial vascular hyperdensity. Skull: No acute osseous abnormality identified. Sinuses/Orbits: Visualized paranasal sinuses and mastoids are clear. Other: Visualized orbits and scalp soft tissues are within normal limits. IMPRESSION: 1. Positive for a small acute 3 mL Right Basal Ganglia Hemorrhage. No significant intracranial mass effect or complicating features. 2. Advanced underlying chronic small vessel disease. Electronically Signed: By: Genevie Ann M.D. On: 11/22/2021 07:09     Assessment: 51 year old male presenting with an acute right basal ganglia ICH - Exam reveals no lateralized weakness or facial droop despite the right BG hemorrhage seen on imaging - CT head: Positive for a small acute 3 mL Right Basal Ganglia Hemorrhage. No significant intracranial mass effect or complicating features. Advanced underlying chronic small vessel disease. - ICH score: 0 - Based on the location and morphology of the hemorrhage, it is most likely hypertensive in origin. Underlying stroke  with hemorrhagic conversion is also possible but felt to be less likely.   Recommendations: - MRI/MRA of head - Carotid ultrasound - TTE - PT consult, OT consult, Speech consult - Cardiac telemetry - Frequent neuro checks - SBP goal of 130-150. BP  management with clevidipine or nicardipine drip if needed. .   - No antiplatelet medications or anticoagulants - DVT prophylaxis with SCDs  Electronically signed: Dr. Kerney Elbe 11/22/2021, 9:16 AM

## 2021-11-23 ENCOUNTER — Inpatient Hospital Stay (HOSPITAL_COMMUNITY)
Admit: 2021-11-23 | Discharge: 2021-11-23 | Disposition: A | Discharge status: Home / Self Care | Payer: BC Managed Care – PPO | Attending: Critical Care Medicine | Admitting: Critical Care Medicine

## 2021-11-23 ENCOUNTER — Other Ambulatory Visit (HOSPITAL_COMMUNITY): Payer: Self-pay

## 2021-11-23 ENCOUNTER — Inpatient Hospital Stay: Payer: BC Managed Care – PPO

## 2021-11-23 DIAGNOSIS — R9431 Abnormal electrocardiogram [ECG] [EKG]: Secondary | ICD-10-CM | POA: Diagnosis not present

## 2021-11-23 DIAGNOSIS — I420 Dilated cardiomyopathy: Secondary | ICD-10-CM

## 2021-11-23 DIAGNOSIS — F1910 Other psychoactive substance abuse, uncomplicated: Secondary | ICD-10-CM | POA: Diagnosis not present

## 2021-11-23 DIAGNOSIS — I161 Hypertensive emergency: Secondary | ICD-10-CM | POA: Diagnosis not present

## 2021-11-23 DIAGNOSIS — I629 Nontraumatic intracranial hemorrhage, unspecified: Secondary | ICD-10-CM | POA: Diagnosis not present

## 2021-11-23 LAB — CBC
HCT: 41.4 % (ref 39.0–52.0)
Hemoglobin: 13.1 g/dL (ref 13.0–17.0)
MCH: 27.9 pg (ref 26.0–34.0)
MCHC: 31.6 g/dL (ref 30.0–36.0)
MCV: 88.3 fL (ref 80.0–100.0)
Platelets: 299 10*3/uL (ref 150–400)
RBC: 4.69 MIL/uL (ref 4.22–5.81)
RDW: 13.3 % (ref 11.5–15.5)
WBC: 9.7 10*3/uL (ref 4.0–10.5)
nRBC: 0 % (ref 0.0–0.2)

## 2021-11-23 LAB — GLUCOSE, CAPILLARY
Glucose-Capillary: 179 mg/dL — ABNORMAL HIGH (ref 70–99)
Glucose-Capillary: 196 mg/dL — ABNORMAL HIGH (ref 70–99)
Glucose-Capillary: 182 mg/dL — ABNORMAL HIGH (ref 70–99)
Glucose-Capillary: 224 mg/dL — ABNORMAL HIGH (ref 70–99)

## 2021-11-23 LAB — ECHOCARDIOGRAM COMPLETE
AR max vel: 2.72 cm2
AV Area VTI: 2.49 cm2
AV Area mean vel: 2.34 cm2
AV Mean grad: 2 mmHg
AV Peak grad: 4.4 mmHg
Ao pk vel: 1.05 m/s
Area-P 1/2: 3.07 cm2
Height: 70 in
S' Lateral: 3.7 cm
Weight: 5918.91 oz

## 2021-11-23 LAB — BASIC METABOLIC PANEL
Anion gap: 7 (ref 5–15)
BUN: 31 mg/dL — ABNORMAL HIGH (ref 6–20)
CO2: 24 mmol/L (ref 22–32)
Calcium: 8.6 mg/dL — ABNORMAL LOW (ref 8.9–10.3)
Chloride: 106 mmol/L (ref 98–111)
Creatinine, Ser: 1.47 mg/dL — ABNORMAL HIGH (ref 0.61–1.24)
GFR, Estimated: 57 mL/min — ABNORMAL LOW (ref >60)
Glucose, Bld: 194 mg/dL — ABNORMAL HIGH (ref 70–99)
Potassium: 4.2 mmol/L (ref 3.5–5.1)
Sodium: 137 mmol/L (ref 135–145)

## 2021-11-23 LAB — MAGNESIUM: Magnesium: 2.2 mg/dL (ref 1.7–2.4)

## 2021-11-23 LAB — PHOSPHORUS: Phosphorus: 4.8 mg/dL — ABNORMAL HIGH (ref 2.5–4.6)

## 2021-11-23 MED ORDER — INSULIN GLARGINE-YFGN 100 UNIT/ML ~~LOC~~ SOLN
10.0000 [IU] | Freq: Every day | SUBCUTANEOUS | Status: DC
  Administered 2021-11-23: 10 [IU] via SUBCUTANEOUS
  Filled 2021-11-23 (×2): qty 0.1

## 2021-11-23 MED ORDER — STROKE: EARLY STAGES OF RECOVERY BOOK
Freq: Once | Status: AC
Start: 2021-11-23 — End: 2021-11-23

## 2021-11-23 MED ORDER — PANTOPRAZOLE SODIUM 40 MG IV SOLR
40.0000 mg | Freq: Every day | INTRAVENOUS | Status: DC
  Administered 2021-11-23: 40 mg via INTRAVENOUS
  Filled 2021-11-23: qty 10

## 2021-11-23 MED ORDER — FUROSEMIDE 10 MG/ML IJ SOLN: 40.0000 mg | Freq: Two times a day (BID) | INTRAMUSCULAR | Status: DC

## 2021-11-23 MED ORDER — TORSEMIDE 20 MG PO TABS
20.0000 mg | ORAL_TABLET | Freq: Every day | ORAL | Status: DC
  Administered 2021-11-24: 20 mg via ORAL
  Filled 2021-11-23: qty 1

## 2021-11-23 MED ORDER — SENNOSIDES-DOCUSATE SODIUM 8.6-50 MG PO TABS
1.0000 | ORAL_TABLET | Freq: Two times a day (BID) | ORAL | Status: DC
  Administered 2021-11-23 – 2021-11-25 (×6): 1 via ORAL
  Filled 2021-11-23 (×5): qty 1

## 2021-11-23 MED ORDER — LINAGLIPTIN 5 MG PO TABS
5.0000 mg | ORAL_TABLET | Freq: Every day | ORAL | Status: DC
  Administered 2021-11-23 – 2021-11-26 (×4): 5 mg via ORAL
  Filled 2021-11-23 (×4): qty 1

## 2021-11-23 MED ORDER — GLIMEPIRIDE 4 MG PO TABS
4.0000 mg | ORAL_TABLET | Freq: Every day | ORAL | Status: DC
  Administered 2021-11-24 – 2021-11-26 (×3): 4 mg via ORAL
  Filled 2021-11-23 (×3): qty 1

## 2021-11-23 NOTE — Inpatient Diabetes Management (Signed)
Order Numbers for Discharge meds:  Lantus Insulin Pen- Number [id number]  Insulin Pen Needles- Number [id number]  CBG meter and Supplies- Number [id number]   --Will follow patient during hospitalization--  Ambrose Finland RN, MSN, CDCES Diabetes Coordinator Inpatient Glycemic Control Team Team Pager: [phone] (8a-5p)

## 2021-11-23 NOTE — Progress Notes (Signed)
*  PRELIMINARY RESULTS* Echocardiogram 2D Echocardiogram has been performed.  Jeremiah Vance 11/23/2021, 7:46 AM

## 2021-11-23 NOTE — Progress Notes (Signed)
Progress Note  Patient Name: Jeremiah Vance Date of Encounter: 11/23/2021  Primary Cardiologist: Agbor-Etang  Subjective   No chest pain, dyspnea, dizziness, presyncope, or syncope.  Headache improving.  Blood pressure improving to the 120s to 140s systolic.  Inpatient Medications    Scheduled Meds:  carvedilol  12.5 mg Oral BID WC   Chlorhexidine Gluconate Cloth  6 each Topical Daily   insulin aspart  0-20 Units Subcutaneous TID WC   insulin aspart  0-5 Units Subcutaneous QHS   irbesartan  300 mg Oral Daily   pneumococcal 20-valent conjugate vaccine  0.5 mL Intramuscular Tomorrow-1000   torsemide  20 mg Oral Daily   Continuous Infusions:  niCARDipine 5 mg/hr (11/23/21 0907)   PRN Meds: acetaminophen, docusate sodium, ondansetron (ZOFRAN) IV, polyethylene glycol   Vital Signs    Vitals:   11/23/21 0600 11/23/21 0630 11/23/21 0700 11/23/21 0800  BP: (!) 144/66 (!) 140/68 (!) 144/71 135/65  Pulse: 65 67 69 63  Resp: (!) 32 (!) 29 20 (!) 34  Temp:    98 F (36.7 C)  TempSrc:    Oral  SpO2: 94% 95% 96% 93%  Weight:      Height:        Intake/Output Summary (Last 24 hours) at 11/23/2021 0914 Last data filed at 11/23/2021 0700 Gross per 24 hour  Intake 1584.09 ml  Output 1200 ml  Net 384.09 ml   Filed Weights   11/22/21 0609 11/23/21 0422  Weight: (!) 167.8 kg (!) 167.8 kg    Telemetry    SR - Personally Reviewed  ECG    No new tracings - Personally Reviewed  Physical Exam   GEN: No acute distress.   Neck: No JVD. Cardiac: RRR, no murmurs, rubs, or gallops.  Respiratory: Clear to auscultation bilaterally.  GI: Soft, nontender, non-distended.   MS: Chronic lower extremity edema with venous stasis; No deformity. Neuro:  Alert and oriented x 3; Nonfocal.  Psych: Normal affect.  Labs    Chemistry Recent Labs  Lab 11/22/21 0628 11/22/21 1158 11/23/21 0449  NA 136  --  137  K 4.1  --  4.2  CL 104  --  106  CO2 25  --  24  GLUCOSE 226*  --   194*  BUN 26*  --  31*  CREATININE 1.36*  --  1.47*  CALCIUM 8.5*  --  8.6*  PROT  --  7.2  --   ALBUMIN  --  3.1*  --   AST  --  16  --   ALT  --  15  --   ALKPHOS  --  70  --   BILITOT  --  1.2  --   GFRNONAA >60  --  57*  ANIONGAP 7  --  7     Hematology Recent Labs  Lab 11/22/21 0628 11/22/21 0743 11/23/21 0449  WBC 9.9 9.4 9.7  RBC 5.24 5.09 4.69  HGB 14.5 14.0 13.1  HCT 45.8 44.6 41.4  MCV 87.4 87.6 88.3  MCH 27.7 27.5 27.9  MCHC 31.7 31.4 31.6  RDW 13.1 13.1 13.3  PLT 312 305 299    Cardiac EnzymesNo results for input(s): "TROPONINI" in the last 168 hours. No results for input(s): "TROPIPOC" in the last 168 hours.   BNP Recent Labs  Lab 11/22/21 0628  BNP 606.8*     DDimer No results for input(s): "DDIMER" in the last 168 hours.   Radiology    DG Chest  2 View  Result Date: 11/22/2021 IMPRESSION: 1. Low lung volumes without radiographic evidence of acute cardiopulmonary disease. Electronically Signed   By: Trudie Reed M.D.   On: 11/22/2021 07:31   CT Head Wo Contrast  Addendum Date: 11/22/2021   ADDENDUM REPORT: 11/22/2021 07:22 ADDENDUM: Study discussed by telephone with Dr. Lenard Lance on 11/22/2021 at 715 hours. Electronically Signed   By: Odessa Fleming M.D.   On: 11/22/2021 07:22   Result Date: 11/22/2021 IMPRESSION: 1. Positive for a small acute 3 mL Right Basal Ganglia Hemorrhage. No significant intracranial mass effect or complicating features. 2. Advanced underlying chronic small vessel disease. Electronically Signed: By: Odessa Fleming M.D. On: 11/22/2021 07:09    Cardiac Studies   2D echo pending __________  2D echo 07/09/2019: 1. Left ventricular ejection fraction, by estimation, is 50 to 55%. The  left ventricle has low normal function. The left ventricle demonstrates  global hypokinesis. There is mild left ventricular hypertrophy. Left  ventricular diastolic parameters are  consistent with Grade II diastolic dysfunction (pseudonormalization).    2. Right ventricular systolic function is normal. The right ventricular  size is mildly enlarged. Tricuspid regurgitation signal is inadequate for  assessing PA pressure.   3. Left atrial size was mildly dilated.  Patient Profile     51 y.o. male with history of cardiomyopathy with prior EF of 45% in 06/2017 subsequently improved to 65% in 08/2017, CVA in 08/2017, CKD stage III, DM2, HTN, medication noncompliance, and OSA nonadherent to CPAP who we are seeing for hypertensive emergency and HFpEF.  Assessment & Plan    Hypertensive emergency: -In the setting of medication noncompliance -Has not been taking his medications for approximately 1 year  -Presenting with markedly elevated pressures, headache, ganglia hemorrhage -Blood pressure improved on nicardipine infusion, wean as tolerated -Once clinically stable, could reinitiate his outpatient regiment, including irbesartan 300 daily, carvedilol at lower dose 12.5 twice daily -Would avoid calcium channel blocker/amlodipine in the setting of chronic leg swelling -Medication adherence is important   2. History of chronic diastolic CHF -Chronic leg edema -Once clinically stable could reinitiate outpatient diuretic torsemide  -Could be initiated at lower dose 20 mg daily with close monitoring of blood pressure   3. CKD stage IIIa: -In the setting of poorly controlled hypertension, poorly controlled diabetes type2 -Appears to be stable   4. Poorly controlled diabetes type 2 -In the setting of medication noncompliance -A1c greater than 10 -Long history of medication noncompliance and poor control   5. Morbid obesity with OSA: -He would benefit from consultation with nutritionist -Does not appear particularly motivated -Noncompliant with CPAP, adherence recommended   6. Leg swelling -Likely component of lymphedema -Skin changes consistent with chronic stasis -Recommend compression hose, he reports "they hurt too much"      For  questions or updates, please contact [facility] Please consult [email or web address] for contact info under Cardiology/STEMI.    Signed, Eula Listen, PA-C [facility] Pager: [phone] 11/23/2021, 9:14 AM

## 2021-11-23 NOTE — Plan of Care (Signed)
Continuing with plan of care. 

## 2021-11-23 NOTE — Progress Notes (Addendum)
PT Cancellation Note  Patient Details Name: Jeremiah Vance MRN: [id number] DOB: 1971-02-03   Cancelled Treatment:    Reason Eval/Treat Not Completed:Other: Patient off the floor at MRI. PT ordered to start 8/26, will follow up tomorrow as appropriate.   Donna Bernard, PT, MPT   Ina Homes 11/23/2021, 2:15 PM

## 2021-11-23 NOTE — Progress Notes (Signed)
Earlier in the shift received orders for patient to begin NIHSS screening every shift and modified stroke scale screening every hour, further orders included SCDs and MRI.  Patient was taken to MRI at 1315 with this nurse present and transport on cardiac monitoring in hospital bed and on 4L [place].  Patient tolerated MRI well and returned to unit in stable condition.  Patient has been able to transfer to and from bedside commode with one person assist. Will continue to monitor patient.

## 2021-11-23 NOTE — TOC Initial Note (Signed)
Transition of Care [facility]) - Initial/Assessment Note    Patient Details  Name: Jeremiah Vance MRN: [id number] Date of Birth: 16-Jun-1970  Transition of Care [facility]) CM/SW Contact:    Allayne Butcher, RN Phone Number: 11/23/2021, 2:53 PM  Clinical Narrative:                  Transition of Care (TOC) Screening Note   Patient Details  Name: Jeremiah Vance Date of Birth: [birth year]   Transition of [facility]) CM/SW Contact:    Allayne Butcher, RN Phone Number: 11/23/2021, 2:53 PM    Transition of Care Department [facility]) has reviewed patient and no TOC needs have been identified at this time. We will continue to monitor patient advancement through interdisciplinary progression rounds. If new patient transition needs arise, please place a TOC consult.          Patient Goals and CMS Choice        Expected Discharge Plan and Services                                                Prior Living Arrangements/Services                       Activities of Daily Living Home Assistive Devices/Equipment: Eyeglasses ADL Screening (condition at time of admission) Patient's cognitive ability adequate to safely complete daily activities?: Yes Is the patient deaf or have difficulty hearing?: No Does the patient have difficulty seeing, even when wearing glasses/contacts?: Yes (left eye blind) Does the patient have difficulty concentrating, remembering, or making decisions?: No Patient able to express need for assistance with ADLs?: Yes Does the patient have difficulty dressing or bathing?: No Independently performs ADLs?: Yes (appropriate for developmental age) Does the patient have difficulty walking or climbing stairs?: Yes (cellulitis bilateral lower extremities) Weakness of Legs: None Weakness of Arms/Hands: None  Permission Sought/Granted                  Emotional Assessment              Admission diagnosis:  Intracranial hemorrhage (HCC)  [I62.9] Uncontrolled hypertension [I10] Hypertensive emergency [I16.1] Patient Active Problem List   Diagnosis Date Noted   Hypertensive emergency 11/22/2021   BMI 50.0-59.9, adult (HCC) 05/22/2020   Cough    Other cirrhosis of liver (HCC)    Ventral hernia without obstruction or gangrene    Acute CHF (congestive heart failure) (HCC) 02/20/2020   Shortness of breath    Hypertensive urgency    Acute respiratory failure with hypoxia (HCC)    Neck pain    Prostate cancer screening 01/21/2020   Lymphedema of both lower extremities 10/17/2019   History of stroke 10/17/2019   CKD (chronic kidney disease) stage 3, GFR 30-59 ml/min (HCC) 08/31/2019   Swelling of limb 08/31/2019   Type 2 diabetes mellitus with hyperlipidemia (HCC) 07/08/2019   Acute kidney injury superimposed on CKD (HCC) 07/08/2019   Chest pain 07/08/2019   Vitreous floaters of both eyes 06/03/2018   Moderate obstructive sleep apnea 02/11/2018   Chronic fatigue 12/16/2017   Essential hypertension 12/16/2017   History of small bowel obstruction 10/24/2017   Adjustment disorder with depressed mood 09/30/2017   Stroke (HCC) 09/14/2017   Acute on chronic heart failure with preserved ejection fraction (HFpEF) (HCC)  Hyperlipidemia    Gout    Elevated LFTs    Heart failure with preserved ejection fraction (HCC)    Morbid obesity (HCC)    Hypoalbuminemia 07/11/2017   Type 2 diabetes mellitus with hyperglycemia (HCC) 07/10/2017   PCP:  Berniece Pap, FNP (Inactive) Pharmacy:   [facility] [id number] [place], [place] - [address] [address] Phone: [phone] Fax: [phone]  [facility] [address] [place] [address] Phone: [phone] Fax: [phone]     Social Determinants of Health (SDOH) Interventions    Readmission Risk Interventions     No data to display

## 2021-11-23 NOTE — Evaluation (Signed)
Speech Language Pathology Evaluation Patient Details Name: Jeremiah Vance MRN: [id number] DOB: 02-26-1971 Today's Date: 11/23/2021 Time: 7829-5621 SLP Time Calculation (min) (ACUTE ONLY): 35 min  Problem List:  Patient Active Problem List   Diagnosis Date Noted   Hypertensive emergency 11/22/2021   BMI 50.0-59.9, adult (HCC) 05/22/2020   Cough    Other cirrhosis of liver (HCC)    Ventral hernia without obstruction or gangrene    Acute CHF (congestive heart failure) (HCC) 02/20/2020   Shortness of breath    Hypertensive urgency    Acute respiratory failure with hypoxia (HCC)    Neck pain    Prostate cancer screening 01/21/2020   Lymphedema of both lower extremities 10/17/2019   History of stroke 10/17/2019   CKD (chronic kidney disease) stage 3, GFR 30-59 ml/min (HCC) 08/31/2019   Swelling of limb 08/31/2019   Type 2 diabetes mellitus with hyperlipidemia (HCC) 07/08/2019   Acute kidney injury superimposed on CKD (HCC) 07/08/2019   Chest pain 07/08/2019   Vitreous floaters of both eyes 06/03/2018   Moderate obstructive sleep apnea 02/11/2018   Chronic fatigue 12/16/2017   Essential hypertension 12/16/2017   History of small bowel obstruction 10/24/2017   Adjustment disorder with depressed mood 09/30/2017   Stroke (HCC) 09/14/2017   Acute on chronic heart failure with preserved ejection fraction (HFpEF) (HCC)    Hyperlipidemia    Gout    Elevated LFTs    Heart failure with preserved ejection fraction (HCC)    Morbid obesity (HCC)    Hypoalbuminemia 07/11/2017   Type 2 diabetes mellitus with hyperglycemia (HCC) 07/10/2017   Past Medical History:  Past Medical History:  Diagnosis Date   Acute ischemic stroke (HCC) 09/06/2017   Acute renal failure superimposed on stage 3a chronic kidney disease (HCC) 07/08/2019   Acute right-sided weakness    Allergies    Anasarca    Anemia 07/10/2017   Arthritis    CHF (congestive heart failure) (HCC)    "coinsided w/kidney problems I was  having 06/2017"   Chicken pox    CKD (chronic kidney disease) stage 3, GFR 30-59 ml/min (HCC) 06/2017   Depression    Elevated troponin 07/08/2019   High cholesterol    History of cardiomyopathy    LVEF 40 to 45% in April 2019 - subsequently normalized   Hyperbilirubinemia 07/10/2017   Hypertension    Ischemic stroke (HCC)    Small left internal capsule infarct due to lacunar disease   Morbid obesity (HCC)    Normocytic anemia 12/16/2017   Recurrent incisional hernia with incarceration s/p repair 10/22/2017 10/21/2017   Stroke (HCC) 08/2017   "right sided weakness since; getting stronger though" (10/22/2017)   Type 2 diabetes mellitus (HCC)    Past Surgical History:  Past Surgical History:  Procedure Laterality Date   ABDOMINAL HERNIA REPAIR  2008; 10/22/2017   "scope; OPEN REPAIR INCARCERATED VENTRAL HERNIA   HERNIA REPAIR     KNEE ARTHROSCOPY Right 1989   VENTRAL HERNIA REPAIR N/A 10/22/2017   Procedure: OPEN REPAIR INCARCERATED VENTRAL HERNIA;  Surgeon: Glenna Fellows, MD;  Location: [facility] OR;  Service: General;  Laterality: N/A;   HPI:  Pt is a 51 yo male who presented to [facility] ER on 08/24 with c/o frontal headache and "numbness" to the left fingers onset of symptoms last night.  He also endorses speech changes "stuttering" during dinner last night at 1900.  He also started having chest pain this morning.  Per ER notes pt reported during triage  he fell last week in the parking lot, but is uncertain if he hit his head.  He states he has not taken his outpatient antihypertensives in a year and replies "I'm stubborn" regarding the reason he stopped taking them.  In the ER, pt bp 198/110.    CT Head revealed a small acute 3 ml Right basal ganglia hemorrhage without significant mass effect or complicating features; Advanced underlying chronic small vessel disease; chronic lacunar infarcts in the bilateral thalami and left basal ganglia.  Pt states his headache has resolved since arrival to the  ER.  He does endorse some mild chest pain, but this has also improved.  MRI following.   Assessment / Plan / Recommendation Clinical Impression   Pt seen today for informal cognitive-linguistic assessment at bedside using parts of the Western Aphasia Battery- Bedside, and assessment of Fluency of speech.  Pt was alert, verbal and quite talkative. Pt endorsed he has had "2 strokes"; noted such on CT of Head: "a new small acute 3 ml Right basal ganglia hemorrhage without significant mass effect or complicating features; and Advanced underlying chronic small vessel disease; and chronic lacunar infarcts in the bilateral thalami and left basal ganglia.". Pt endorsed decreased articulation of speech, speech precision, at Baseline prior to this event. Noted c/o "stuttering", or dysfluency, at admit but this is not overtly apparent this morning per NSG report. Pt is A/O and communicating his wants/needs appropriately to [place]; he is talking on the phone to his family members this morning.  OF NOTE: he is eating/drinking at meals w/ No overt s/s of aspiration or difficulty swallowing per NSG/pt.  Pt appears to present w/ mild Motor Speech deficits c/b mild dysarthria and dysfluency of speech at conversation level. He exhibited "rushed" speech w/ decreased articulatory precision -- suspect some of this is Baseline in setting of 2 Prior CVAs(chronic lacunar infarcts in the bilateral thalami and left basal ganglia), advanced underlying chronic small vessel disease, and pt's own report of Baseline decline in his speech -- when asked 3 different times, and he said his speech was "normal" for him but then acknowledged the "slurred speech might be there".   Pt's "rushed" speech was fairly consistent and speech intelligibility was more impacted at the sentence/conversation levels d/t the amount of information. Pt was intermittently aware of his errors and self-corrected. When pt was educated on articulatory strategies and  instructed to repeat himself and/or slow down (using them), pt's intelligibility of speech improved and any dysfluency was not pronounced. Practiced articulatory strategies w/ pt encouraging him to look for places in conversation to pause and slow down; monitor his own articulation of speech to increase precision w/ effort in his speech. Discussed the importance of sitting fully upright to support diahpragmatic breathing and breath support for speech.  No gross expressive or receptive aphasia noted; No gross cognitive deficits noted. OM exam was wfl for lingual/labial strength/ROM.  Education given on Articulatory Strategies to pt; practiced and gave handouts. Recommend pt f/u at next venue of care w/ skilled ST services if any decline in communication abilities from Baseline status post returning home. CM/NSG/MD updated. Pt agreed. MD agreed.     SLP Assessment  SLP Recommendation/Assessment: All further Speech Lanaguage Pathology  needs can be addressed in the next venue of care SLP Visit Diagnosis: Dysarthria and anarthria (R47.1);Cognitive communication deficit (R41.841)    Recommendations for follow up therapy are one component of a multi-disciplinary discharge planning process, led by the attending physician.  Recommendations  may be updated based on patient status, additional functional criteria and insurance authorization.    Follow Up Recommendations  Follow physician's recommendations for discharge plan and follow up therapies (at next venue of care if any decline in communication abilities from Baseline status)    Assistance Recommended at Discharge  PRN  Functional Status Assessment Patient has had a recent decline in their functional status and/or demonstrates limited ability to make significant improvements in function in a reasonable and predictable amount of time (pt reported Baseline decreased articulatory precision of speech)  Frequency and Duration  (n/a)   (n/a)      SLP  Evaluation Cognition  Overall Cognitive Status: No family/caregiver present to determine baseline cognitive functioning (difficult to assess; appeared grossly [facility]) Arousal/Alertness: Awake/alert Orientation Level: Oriented to person;Oriented to place;Oriented to time;Oriented to situation Year: 2023 Month: August (self-corrected) Attention: Focused;Sustained Focused Attention: Appears intact Sustained Attention: Impaired Sustained Attention Impairment: Verbal complex (min distracted looking out the door to noises) Memory: Appears intact (to recent events of morning including breakfast items) Awareness: Appears intact Problem Solving: Appears intact Executive Function: Decision Making Decision Making: Appears intact Behaviors:  (distracted - min) Safety/Judgment: Appears intact Comments: adequate to situational circumstances       Comprehension  Auditory Comprehension Overall Auditory Comprehension: Appears within functional limits for tasks assessed Yes/No Questions: Within Functional Limits Commands: Within Functional Limits (1-2 step) Conversation: Complex Interfering Components: Attention;Motor planning EffectiveTechniques: Slowed speech;Repetition Visual Recognition/Discrimination Discrimination: Not tested Reading Comprehension Reading Status: Within funtional limits (in setting of vision deficits)    Expression Expression Primary Mode of Expression: Verbal Verbal Expression Overall Verbal Expression: Appears within functional limits for tasks assessed Initiation: No impairment Automatic Speech:  (WFL) Level of Generative/Spontaneous Verbalization: Sentence Repetition: No impairment Naming: No impairment Pragmatics: Unable to assess (fully) Interfering Components: Attention;Speech intelligibility Effective Techniques: Articulatory cues Non-Verbal Means of Communication: Not applicable Written Expression Written Expression: Not tested   Oral / Motor  Oral  Motor/Sensory Function Overall Oral Motor/Sensory Function: Within functional limits Motor Speech Overall Motor Speech: Impaired (but min impairment at baseline per pt) Respiration: Within functional limits Phonation: Normal Resonance: Within functional limits Articulation: Impaired Level of Impairment: Phrase Intelligibility: Intelligibility reduced Word: 75-100% accurate Phrase: 75-100% accurate Sentence: 75-100% accurate Conversation: 75-100% accurate Motor Planning: Impaired Level of Impairment: Secretary/administrator Errors: Aware (~75% of the time) Interfering Components: Premorbid status Effective Techniques: Slow rate;Increased vocal intensity;Over-articulate               Jerilynn Som, MS, CCC-SLP Speech Language Pathologist Rehab Services; [facility] [phone] (ascom)  Abigal Choung 11/23/2021, 3:20 PM

## 2021-11-23 NOTE — Progress Notes (Signed)
PHARMACY CONSULT NOTE  Pharmacy Consult for Electrolyte Monitoring and Replacement   Recent Labs: Potassium (mmol/L)  Date Value  11/23/2021 4.2   Magnesium (mg/dL)  Date Value  16/12/9602 2.2   Calcium (mg/dL)  Date Value  54/11/8117 8.6 (L)   Albumin (g/dL)  Date Value  14/78/2956 3.1 (L)  05/19/2020 3.6 (L)   Phosphorus (mg/dL)  Date Value  21/30/8657 4.8 (H)   Sodium (mmol/L)  Date Value  11/23/2021 137  05/19/2020 137    Assessment:  51 y.o. male with a past medical history of prior CVA in 2019, CHF, CKD, hypertension, hyperlipidemia, obesity, diabetes, presents to the emergency department with a headache and chest pain. Pharmacy is asked to follow and replace electrolytes while in CCU   Diuretics: torsemide 20 mg po daily  Goal of Therapy:  Electrolytes WNL  Plan:  No electrolyte replacement warranted for today Recheck electrolytes in am  Lowella Bandy ,PharmD Clinical Pharmacist 11/23/2021 7:15 AM

## 2021-11-23 NOTE — Inpatient Diabetes Management (Addendum)
Inpatient Diabetes Program Recommendations  AACE/ADA: New Consensus Statement on Inpatient Glycemic Control (2015)  Target Ranges:  Prepandial:   less than 140 mg/dL      Peak postprandial:   less than 180 mg/dL (1-2 hours)      Critically ill patients:  140 - 180 mg/dL    Latest Reference Range & Units 11/22/21 11:58  Hemoglobin A1C 4.8 - 5.6 % 10.5 (H)  254 mg/dl  (H): Data is abnormally high  Latest Reference Range & Units 11/22/21 11:57 11/22/21 17:46 11/22/21 21:48 11/23/21 07:35  Glucose-Capillary 70 - 99 mg/dL 244 (H)  7 units Novolog  212 (H)  7 units Novolog  169 (H) 179 (H)  (H): Data is abnormally high   Admit with: Right basal ganglia hemorrhage secondary to hypertensive emergency due to medication noncompliance  History: DM, CVA, CKD  Home DM Meds: Jardiance 10 mg daily (NOT taking)        Tresiba 10 units daily (NOT taking)  Current Orders: Novolog Resistant Correction Scale/ SSI (0-20 units) TID AC + HS     Not taking any meds due to lack of insurance--notes state pt now has insurance   MD- Pt states he needs CBG meter at time of discharge.  Please provide pt with Rx for CBG meter: Order Number [id number]  Also, pt states he would prefer to try oral Diabetes meds before starting insulin at home.  Never started the Tresiba insulin even though it's listed in his home med rec.  Stated he would rather NOT take Jardiance due to the cost.  We discussed other affordable oral DM medication options.  Not sure Metformin is an option given his CKD.  Glipizide or Amaryl are both generic and can be purchased for $4 at [facility] without a Rx.  I asked Pharmacy to perform a benefits check on several different oral meds and they could not get any co-pay prices from the check they ran.  They did tell me pt must use [employer] or [employer] for any coverage with his insurance.  Based on this info, I think we should keep his meds affordable so that he will at least take  them.  Could try Amaryl 4 mg Daily + Tradjenta 5 mg Daily for home    Addendum 10:45am--Met w/ pt at bedside.  Pt told me he has NOT taken any diabetes meds for over a year now due to losing insurance, however, he now has insurance and plans to find PCP after d/c.  Was taking Jardiance a year ago (due to having samples from PCP office), but told me the Vania Rea was very expensive and he likely cannot afford even with insurance.  I asked him about the Tresiba insulin he was given by PCP and pt told me he was NEVER given Insulin to start (even though notes from 03/20/2020 Chronic Care mgmt at [facility] urged pt to come by the office to get samples of Tresiba insulin).  Does NOT check CBGs at home due to NOT having a CBG meter.  Stated to me that he knows he needs to do better and plans to start walking with his sister and trying to eat better.  I asked pt about his plans to find a PCP after d/c and pt stated he will work on finding one himself.    Spoke with patient about his current A1c of 10.5%.  Explained what an A1c is and what it measures.  Reminded patient that his goal A1c is 7%  or less per ADA standards to prevent both acute and long-term complications.  Explained to patient the extreme importance of good glucose control at home especially in light of recent CVA and in prevention of further cardiac complications and other chronic complications due to hyperglycemia.  Reviewed goal CBGs for home.  Encouraged patient to check his CBGs at least bid at home (fasting and another check within the day) and to record all CBGs in a logbook for his PCP to review.  Suggested to pt that he may look into seeing an Endocrinologist as well to help better manage his diabetes.  I also asked pt if he had any questions about healthy eating at home for blood sugar management and pt told me he knows how to eat better and plans to start cooking with his sister.     --Will follow patient during  hospitalization--  Wyn Quaker RN, MSN, [place] Diabetes Coordinator Inpatient Glycemic Control Team Team Pager: [phone] (8a-5p)

## 2021-11-23 NOTE — Progress Notes (Signed)
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 rate (!) 30, height 5\' 10"  (1.778 m), weight (!) 167.8 kg, SpO2 99 %.  PHYSICAL EXAMINATION:   GENERAL:  51 y.o.-year-old patient lying in the bed with no acute distress. Morbidly obese LUNGS: Normal breath sounds bilaterally, no wheezing, rales, rhonchi.  CARDIOVASCULAR: S1, S2 normal. No murmurs, rubs, or gallops.  ABDOMEN: Soft, nontender, nondistended. Bowel sounds present.  EXTREMITIES: No  edema b/l.    NEUROLOGIC: nonfocal  patient is alert and awake, blind  left eye, no focal deficit SKIN:per RN   LABORATORY PANEL:  CBC Recent Labs  Lab 11/23/21 0449  WBC 9.7  HGB 13.1  HCT 41.4  PLT 299    Chemistries  Recent Labs  Lab 11/22/21 1158 11/23/21 0449  NA  --  137  K  --  4.2  CL  --  106  CO2  --  24  GLUCOSE  --  194*  BUN  --  31*  CREATININE  --  1.47*  CALCIUM  --  8.6*  MG 1.8 2.2  AST 16  --   ALT 15  --   ALKPHOS 70  --   BILITOT 1.2  --    Cardiac Enzymes No results for input(s): "TROPONINI" in the last 168 hours. RADIOLOGY:  MR BRAIN WO CONTRAST  Result Date: 11/23/2021 CLINICAL DATA:  Stroke, follow-up. EXAM: MRI HEAD WITHOUT CONTRAST TECHNIQUE: Multiplanar, multiecho pulse sequences of the brain and surrounding structures were obtained without intravenous contrast. COMPARISON:  Head CT November 22, 2021. FINDINGS: Brain: A 2.0 x 1.8 x 0.9 cm intraparenchymal hematoma within the right putamen/external capsule with mild surrounding vasogenic edema. A focus of restricted diffusion/cytotoxic edema is seen within the right corona radiata, right above the hemorrhagic lesion. Findings  appear stable when compared to prior head CT. Foci of susceptibility artifact are seen in the bilateral temporal lobe, right occipital lobe right thalamus and left putamen consistent with prior microhemorrhages. Remote lacunar infarcts in the right centrum semiovale and corona radiata. Scattered and confluent foci of T2 hyperintensity scattered within the white matter of the cerebral hemispheres and within the pons, nonspecific, most likely related to chronic microangiopathy no hydrocephalus. Vascular: Normal flow voids. Skull and upper cervical spine: Normal marrow signal. Sinuses/Orbits: FLAIR hyperintensity is noted on the left side of the right globe and less evident posteriorly in the left globe (series 15, images 14 and 15). Paranasal sinuses are clear. Other: None. IMPRESSION: 1. Stable appearance of right putamen/external capsule hematoma compared to recent CT head with mild surrounding vasogenic edema without significant mass effect. 2. Focus of restricted diffusion is seen within the right corona radiata right above the hematoma, corresponding to acute infarct. 3. FLAIR hyperintensity is noted on the left side of the right globe and less evident posteriorly in the left globe may represent small retinal detachments. Correlation with fundoscopic exam suggested. 4. Mild-to-moderate chronic white matter disease. Electronically Signed   By: Pedro Earls M.D.   On: 11/23/2021 14:56   ECHOCARDIOGRAM COMPLETE  Result Date: 11/23/2021    ECHOCARDIOGRAM REPORT   Patient Name:   Jeremiah  D Vance Date of Exam: 11/23/2021 Medical Rec #:  [id number]       Height:       70.0 in Accession #:    [id number]      Weight:       369.9 lb Date of Birth:  1970-08-09       BSA:          2.711 m Patient Age:    2 years        BP:           157/57 mmHg Patient Gender: M               HR:           72 bpm. Exam Location:  [facility] Procedure: 2D Echo, Color Doppler and Cardiac Doppler Indications:     Abnormal ECG  R94.31  History:         Patient has prior history of Echocardiogram examinations, most                  recent 07/09/2019. CHF; Risk Factors:Hypertension.  Sonographer:     Sherrie Sport Referring Phys:  [id number] Teressa Lower Diagnosing Phys: Ida Rogue MD  Sonographer Comments: Suboptimal apical window. IMPRESSIONS  1. Left ventricular ejection fraction, by estimation, is 30 to 35%. The left ventricle has moderately decreased function. The left ventricle demonstrates global hypokinesis. There is severe left ventricular hypertrophy. Left ventricular diastolic parameters are consistent with Grade I diastolic dysfunction (impaired relaxation).  2. Right ventricular systolic function is mildly reduced. The right ventricular size is mildly enlarged. There is normal pulmonary artery systolic pressure. The estimated right ventricular systolic pressure is [id number] mmHg.  3. Left atrial size was moderately dilated.  4. The mitral valve is normal in structure. No evidence of mitral valve regurgitation. No evidence of mitral stenosis.  5. The aortic valve is normal in structure. Aortic valve regurgitation is not visualized. Aortic valve sclerosis is present, with no evidence of aortic valve stenosis.  6. There is mild dilatation of the aortic root, measuring 42 mm.  7. The inferior vena cava is normal in size with greater than 50% respiratory variability, suggesting right atrial pressure of 3 mmHg.  8. Challenging images FINDINGS  Left Ventricle: Left ventricular ejection fraction, by estimation, is 30 to 35%. The left ventricle has moderately decreased function. The left ventricle demonstrates global hypokinesis. The left ventricular internal cavity size was normal in size. There is severe left ventricular hypertrophy. Left ventricular diastolic parameters are consistent with Grade I diastolic dysfunction (impaired relaxation). Right Ventricle: The right ventricular size is mildly enlarged. No increase in right ventricular wall  thickness. Right ventricular systolic function is mildly reduced. There is normal pulmonary artery systolic pressure. The tricuspid regurgitant velocity  is 1.78 m/s, and with an assumed right atrial pressure of 10 mmHg, the estimated right ventricular systolic pressure is [id number] mmHg. Left Atrium: Left atrial size was moderately dilated. Right Atrium: Right atrial size was normal in size. Pericardium: There is no evidence of pericardial effusion. Mitral Valve: The mitral valve is normal in structure. Mild mitral annular calcification. No evidence of mitral valve regurgitation. No evidence of mitral valve stenosis. Tricuspid Valve: The tricuspid valve is normal in structure. Tricuspid valve regurgitation is mild . No evidence of tricuspid stenosis. Aortic Valve: The aortic valve is normal in structure. Aortic valve regurgitation is not visualized. Aortic valve sclerosis is present, with no evidence of aortic valve stenosis. Aortic valve mean gradient  measures 2.0 mmHg. Aortic valve peak gradient measures 4.4 mmHg. Aortic valve area, by VTI measures 2.49 cm. Pulmonic Valve: The pulmonic valve was normal in structure. Pulmonic valve regurgitation is not visualized. No evidence of pulmonic stenosis. Aorta: The aortic root is normal in size and structure. There is mild dilatation of the aortic root, measuring 42 mm. Venous: The inferior vena cava is normal in size with greater than 50% respiratory variability, suggesting right atrial pressure of 3 mmHg. IAS/Shunts: No atrial level shunt detected by color flow Doppler.  LEFT VENTRICLE PLAX 2D LVIDd:         5.20 cm   Diastology LVIDs:         3.70 cm   LV e' medial:    3.48 cm/s LV PW:         1.90 cm   LV E/e' medial:  21.6 LV IVS:        2.00 cm   LV e' lateral:   4.03 cm/s LVOT diam:     2.20 cm   LV E/e' lateral: 18.6 LV SV:         43 LV SV Index:   16 LVOT Area:     3.80 cm  RIGHT VENTRICLE RV S prime:     13.70 cm/s LEFT ATRIUM             Index        RIGHT  ATRIUM           Index LA diam:        4.70 cm 1.73 cm/m   RA Area:     28.20 cm LA Vol (A2C):   86.7 ml 31.98 ml/m  RA Volume:   90.70 ml  33.46 ml/m LA Vol (A4C):   95.2 ml 35.12 ml/m LA Biplane Vol: 95.4 ml 35.19 ml/m  AORTIC VALVE AV Area (Vmax):    2.72 cm AV Area (Vmean):   2.34 cm AV Area (VTI):     2.49 cm AV Vmax:           105.00 cm/s AV Vmean:          70.400 cm/s AV VTI:            0.174 m AV Peak Grad:      4.4 mmHg AV Mean Grad:      2.0 mmHg LVOT Vmax:         75.10 cm/s LVOT Vmean:        43.400 cm/s LVOT VTI:          0.114 m LVOT/AV VTI ratio: 0.66  AORTA Ao Root diam: 4.20 cm MITRAL VALVE               TRICUSPID VALVE MV Area (PHT): 3.07 cm    TR Peak grad:   12.7 mmHg MV Decel Time: 247 msec    TR Vmax:        178.00 cm/s MV E velocity: 75.00 cm/s MV A velocity: 79.70 cm/s  SHUNTS MV E/A ratio:  0.94        Systemic VTI:  0.11 m                            Systemic Diam: 2.20 cm Ida Rogue MD Electronically signed by Ida Rogue MD Signature Date/Time: 11/23/2021/10:33:25 AM    Final    DG Chest 2 View  Result Date: 11/22/2021 CLINICAL DATA:  51 year old male with history of chest  pain. EXAM: CHEST - 2 VIEW COMPARISON:  Chest x-ray 02/22/2020. FINDINGS: Lung volumes are low. No consolidative airspace disease. No pleural effusions. No pneumothorax. No pulmonary nodule or mass noted. Crowding of the pulmonary vasculature accentuated by low lung volumes, without frank pulmonary edema. Cardiomediastinal silhouette is within normal limits. IMPRESSION: 1. Low lung volumes without radiographic evidence of acute cardiopulmonary disease. Electronically Signed   By: Trudie Reed M.D.   On: 11/22/2021 07:31   CT Head Wo Contrast  Addendum Date: 11/22/2021   ADDENDUM REPORT: 11/22/2021 07:22 ADDENDUM: Study discussed by telephone with Dr. Lenard Lance on 11/22/2021 at 715 hours. Electronically Signed   By: Odessa Fleming M.D.   On: 11/22/2021 07:22   Result Date: 11/22/2021 CLINICAL DATA:   51 year old male with headache, left side chest pain. EXAM: CT HEAD WITHOUT CONTRAST TECHNIQUE: Contiguous axial images were obtained from the base of the skull through the vertex without intravenous contrast. RADIATION DOSE REDUCTION: This exam was performed according to the departmental dose-optimization program which includes automated exposure control, adjustment of the mA and/or kV according to patient size and/or use of iterative reconstruction technique. COMPARISON:  Head CT 10/24/2019.  Brain MRI 08/06/2018. FINDINGS: Brain: Small hyperdense hemorrhage in the right lentiform nuclei, oval and encompassing 21 x 12 x 22 mm (AP by transverse by CC) for an estimated blood volume of 3 mL. Acute blood is tracking into the subcortical white matter of the frontal operculum (series 4, image 38). No intraventricular or extra-axial extension. No significant intracranial mass effect or midline shift. No ventriculomegaly. Superimposed chronic lacunar infarcts in the bilateral thalami and left basal ganglia. Patchy additional white matter hypodensity. No superimposed cortically based acute infarct identified. Basilar cisterns remain normal. Vascular: Calcified atherosclerosis at the skull base. No suspicious intracranial vascular hyperdensity. Skull: No acute osseous abnormality identified. Sinuses/Orbits: Visualized paranasal sinuses and mastoids are clear. Other: Visualized orbits and scalp soft tissues are within normal limits. IMPRESSION: 1. Positive for a small acute 3 mL Right Basal Ganglia Hemorrhage. No significant intracranial mass effect or complicating features. 2. Advanced underlying chronic small vessel disease. Electronically Signed: By: Odessa Fleming M.D. On: 11/22/2021 07:09    Assessment and Plan  Alben D Mani is an 51 y.o. male with a PMHx of 2 prior strokes, CKD, anemia, arthritis, CHF, Chicken pox, hypercholesterolemia, cardiomyopathy, hyperbilirubinemia, HTN, morbid obesity and DM2 who presented to  the ED this morning with left sided CP accompanied by frontal HA and "numbness" to fingers of left hand since last night  CT head revealed a small right basal ganglia ICH with a small rim of adjacent vasogenic edema. The appearance is most consistent with a hypertensive hemorrhage.   MRI Brain Stable appearance of right putamen/external capsule hematoma compared to recent CT head with mild surrounding vasogenic edema without significant mass effect. 2. Focus of restricted diffusion is seen within the right corona radiata right above the hematoma, corresponding to acute infarct. 3. FLAIR hyperintensity is noted on the left side of the right globe and less evident posteriorly in the left globe may represent small retinal detachments. Correlation with fundoscopic exam suggested. 4. Mild-to-moderate chronic white matter disease.  Acute CVA, hemorrhagic right basal ganglia with right external capsule/putamen hematoma hypertensive emergency medication noncompliance recent fall -- patient currently on IV Nicardipine drip -- added irbesartan and Coreg -- seen by Dr. Otelia Limes neurology --Urine drug screen positive for tricyclic antidepressant -- follow-up PT OT -- appreciate speech therapy input  Chronic diastolic/systolic congestive heart failure --  seen by [facility] cardiology Dr. Mariah Milling. -- Not seem to be in heart failure -- give one dose of Lasix -- resume torsemide once creatinine stable  Diabetes type II, uncontrolled due to medication noncompliance, chronic kidney disease stage IIIa -- seen by diabetes coordinator -- will consider doing Lantus, Amaryl, metformin due to patient affordability and insurance coverage --A 1C 10.6 --sliding scale insulin  Chronic kidney disease stage IIIa -- creatinine stable -- avoid nephrotoxic agents --Replace electrolytes as indicated  Morbid obesity suspected sleep apnea -- patient advised lifestyle and dietary changes -- sleep study as  outpatient  Procedures: Family communication :none Consults :Neurology CODE STATUS: full DVT Prophylaxis :SCD Level of care: ICU Status is: Inpatient Remains inpatient appropriate because: Hemorrahgic stroke on cardene gtt    TOTAL TIME TAKING CARE OF THIS PATIENT: 35 minutes.  >50% time spent on counselling and coordination of care  Note: This dictation was prepared with Dragon dictation along with smaller phrase technology. Any transcriptional errors that result from this process are unintentional.  Enedina Finner M.D    [facility]   CC: Primary care physician; Berniece Pap, FNP (Inactive)

## 2021-11-24 DIAGNOSIS — E118 Type 2 diabetes mellitus with unspecified complications: Secondary | ICD-10-CM | POA: Diagnosis not present

## 2021-11-24 DIAGNOSIS — I5020 Unspecified systolic (congestive) heart failure: Secondary | ICD-10-CM

## 2021-11-24 DIAGNOSIS — I161 Hypertensive emergency: Secondary | ICD-10-CM | POA: Diagnosis not present

## 2021-11-24 DIAGNOSIS — I629 Nontraumatic intracranial hemorrhage, unspecified: Secondary | ICD-10-CM

## 2021-11-24 LAB — BASIC METABOLIC PANEL
Anion gap: 5 (ref 5–15)
BUN: 33 mg/dL — ABNORMAL HIGH (ref 6–20)
CO2: 26 mmol/L (ref 22–32)
Calcium: 8.5 mg/dL — ABNORMAL LOW (ref 8.9–10.3)
Chloride: 104 mmol/L (ref 98–111)
Creatinine, Ser: 1.57 mg/dL — ABNORMAL HIGH (ref 0.61–1.24)
GFR, Estimated: 53 mL/min — ABNORMAL LOW (ref >60)
Glucose, Bld: 174 mg/dL — ABNORMAL HIGH (ref 70–99)
Potassium: 4.3 mmol/L (ref 3.5–5.1)
Sodium: 135 mmol/L (ref 135–145)

## 2021-11-24 LAB — GLUCOSE, CAPILLARY
Glucose-Capillary: 153 mg/dL — ABNORMAL HIGH (ref 70–99)
Glucose-Capillary: 191 mg/dL — ABNORMAL HIGH (ref 70–99)
Glucose-Capillary: 125 mg/dL — ABNORMAL HIGH (ref 70–99)
Glucose-Capillary: 82 mg/dL (ref 70–99)

## 2021-11-24 LAB — THYROID PANEL WITH TSH
Free Thyroxine Index: 1.7 (ref 1.2–4.9)
T3 Uptake Ratio: 26 % (ref 24–39)
T4, Total: 6.4 ug/dL (ref 4.5–12.0)
TSH: 0.842 u[IU]/mL (ref 0.450–4.500)

## 2021-11-24 LAB — MAGNESIUM: Magnesium: 2.1 mg/dL (ref 1.7–2.4)

## 2021-11-24 MED ORDER — HYDRALAZINE HCL 50 MG PO TABS
25.0000 mg | ORAL_TABLET | Freq: Three times a day (TID) | ORAL | Status: DC
  Administered 2021-11-24: 25 mg via ORAL
  Filled 2021-11-24: qty 1

## 2021-11-24 MED ORDER — HYDRALAZINE HCL 20 MG/ML IJ SOLN: 10.0000 mg | Freq: Four times a day (QID) | INTRAMUSCULAR | Status: DC | PRN

## 2021-11-24 MED ORDER — INSULIN GLARGINE-YFGN 100 UNIT/ML ~~LOC~~ SOLN
20.0000 [IU] | Freq: Every day | SUBCUTANEOUS | Status: DC
Start: 2021-11-24 — End: 2021-11-25
  Administered 2021-11-24 – 2021-11-25 (×2): 20 [IU] via SUBCUTANEOUS
  Filled 2021-11-24 (×2): qty 0.2

## 2021-11-24 MED ORDER — TORSEMIDE 20 MG PO TABS
40.0000 mg | ORAL_TABLET | Freq: Every day | ORAL | Status: DC
  Administered 2021-11-25: 40 mg via ORAL
  Filled 2021-11-24: qty 2

## 2021-11-24 MED ORDER — HYDRALAZINE HCL 50 MG PO TABS
50.0000 mg | ORAL_TABLET | Freq: Three times a day (TID) | ORAL | Status: DC
  Administered 2021-11-24 – 2021-11-26 (×7): 50 mg via ORAL
  Filled 2021-11-24 (×7): qty 1

## 2021-11-24 NOTE — TOC Progression Note (Signed)
Transition of Care [facility]) - Progression Note    Patient Details  Name: Jeremiah Vance MRN: [id number] Date of Birth: [birth year]  [facility] CM/SW Contact  Liliana Cline, LCSW Phone Number: 11/24/2021, 4:56 PM  Clinical Narrative:    [facility] unable to accept patient.   Expected Discharge Plan: Home w Home Health Services Barriers to Discharge: Continued Medical Work up  Expected Discharge Plan and Services Expected Discharge Plan: Home w Home Health Services   Discharge Planning Services: CM Consult Post Acute Care Choice: Home Health Living arrangements for the past 2 months: Single Family Home                 DME Arranged: Walker rolling, 3-N-1 DME Agency: [employer]       HH Arranged: PT, OT HH Agency: [facility] Date HH Agency Contacted: 11/24/21 Time HH Agency Contacted: [id number] Representative spoke with at [facility] Agency: weekend [employer]   Social Determinants of Health (SDOH) Interventions    Readmission Risk Interventions     No data to display

## 2021-11-24 NOTE — Progress Notes (Signed)
Patient's NIH assessments have remained unchanged, per Dr. Otelia Limes discontinue every shift NIH assessment and modified NIH assessment every 60 minutes.

## 2021-11-24 NOTE — Plan of Care (Signed)
Continuing with plan of care. 

## 2021-11-24 NOTE — Plan of Care (Signed)
MRI brain: 1. Stable appearance of right putamen/external capsule hematoma compared to recent CT head with mild surrounding vasogenic edema without significant mass effect. 2. Focus of restricted diffusion is seen within the right corona radiata right above the hematoma, corresponding to acute infarct. 3. FLAIR hyperintensity is noted on the left side of the right globe and less evident posteriorly in the left globe may represent small retinal detachments. Correlation with fundoscopic exam suggested. 4. Mild-to-moderate chronic white matter disease.  TTE without mural thrombus or valvular vegetation mentioned in the report. Low EF of 30 to 35%. The left ventricle  demonstrates global hypokinesis.  A/R: 51 year old male presenting with acute hypertensive right basal ganglia ICH - Imaging without findings to suggest an alternate etiology - Continue to monitor with frequent neuro checks - PT/OT/Speech - BP management with SBP goal of 130-150 - CTA of head and neck is recommended as an alternative to MRA head/carotid ultrasound to complete hemorrhage work up  Electronically signed: Dr. Caryl Pina

## 2021-11-24 NOTE — Progress Notes (Signed)
Progress Note  Patient Name: Jeremiah Vance Date of Encounter: 11/24/2021  Primary Cardiologist: Agbor-Etang  Subjective   No chest pain, dyspnea, dizziness, presyncope, or syncope.  Headache improved.  Blood pressure improving from presentation.  Inpatient Medications    Scheduled Meds:  carvedilol  12.5 mg Oral BID WC   Chlorhexidine Gluconate Cloth  6 each Topical Daily   glimepiride  4 mg Oral Q breakfast   hydrALAZINE  25 mg Oral TID   insulin aspart  0-20 Units Subcutaneous TID WC   insulin aspart  0-5 Units Subcutaneous QHS   insulin glargine-yfgn  20 Units Subcutaneous Daily   irbesartan  300 mg Oral Daily   linagliptin  5 mg Oral Daily   pneumococcal 20-valent conjugate vaccine  0.5 mL Intramuscular Tomorrow-1000   senna-docusate  1 tablet Oral BID   torsemide  20 mg Oral Daily   Continuous Infusions:  niCARDipine 2.5 mg/hr (11/24/21 0630)   PRN Meds: acetaminophen, docusate sodium, ondansetron (ZOFRAN) IV, polyethylene glycol   Vital Signs    Vitals:   11/24/21 0500 11/24/21 0600 11/24/21 0700 11/24/21 0730  BP: (!) 151/75 138/82 (!) 145/77 (!) 152/81  Pulse: (!) 57 67 (!) 58 60  Resp: (!) 28 18 (!) 31 (!) 26  Temp:      TempSrc:      SpO2: 93% 100% 98% 98%  Weight: (!) 167.8 kg     Height:        Intake/Output Summary (Last 24 hours) at 11/24/2021 0759 Last data filed at 11/24/2021 0500 Gross per 24 hour  Intake 992.2 ml  Output 2250 ml  Net -1257.8 ml    Filed Weights   11/22/21 0609 11/23/21 0422 11/24/21 0500  Weight: (!) 167.8 kg (!) 167.8 kg (!) 167.8 kg    Telemetry    SR - Personally Reviewed  ECG    No new tracings - Personally Reviewed  Physical Exam   GEN: No acute distress.   Neck: No JVD. Cardiac: RRR, no murmurs, rubs, or gallops.  Respiratory: Clear to auscultation bilaterally.  GI: Soft, nontender, non-distended.   MS: Chronic lower extremity edema with venous stasis; No deformity. Neuro:  Alert and oriented x 3;  Nonfocal.  Psych: Normal affect.  Labs    Chemistry Recent Labs  Lab 11/22/21 0628 11/22/21 1158 11/23/21 0449 11/24/21 0319  NA 136  --  137 135  K 4.1  --  4.2 4.3  CL 104  --  106 104  CO2 25  --  24 26  GLUCOSE 226*  --  194* 174*  BUN 26*  --  31* 33*  CREATININE 1.36*  --  1.47* 1.57*  CALCIUM 8.5*  --  8.6* 8.5*  PROT  --  7.2  --   --   ALBUMIN  --  3.1*  --   --   AST  --  16  --   --   ALT  --  15  --   --   ALKPHOS  --  70  --   --   BILITOT  --  1.2  --   --   GFRNONAA >60  --  57* 53*  ANIONGAP 7  --  7 5      Hematology Recent Labs  Lab 11/22/21 0628 11/22/21 0743 11/23/21 0449  WBC 9.9 9.4 9.7  RBC 5.24 5.09 4.69  HGB 14.5 14.0 13.1  HCT 45.8 44.6 41.4  MCV 87.4 87.6 88.3  MCH 27.7  27.5 27.9  MCHC 31.7 31.4 31.6  RDW 13.1 13.1 13.3  PLT 312 305 299     Cardiac EnzymesNo results for input(s): "TROPONINI" in the last 168 hours. No results for input(s): "TROPIPOC" in the last 168 hours.   BNP Recent Labs  Lab 11/22/21 0628  BNP 606.8*      DDimer No results for input(s): "DDIMER" in the last 168 hours.   Radiology    DG Chest 2 View  Result Date: 11/22/2021 IMPRESSION: 1. Low lung volumes without radiographic evidence of acute cardiopulmonary disease. Electronically Signed   By: Trudie Reed M.D.   On: 11/22/2021 07:31   CT Head Wo Contrast  Addendum Date: 11/22/2021   ADDENDUM REPORT: 11/22/2021 07:22 ADDENDUM: Study discussed by telephone with Dr. Lenard Lance on 11/22/2021 at 715 hours. Electronically Signed   By: Odessa Fleming M.D.   On: 11/22/2021 07:22   Result Date: 11/22/2021 IMPRESSION: 1. Positive for a small acute 3 mL Right Basal Ganglia Hemorrhage. No significant intracranial mass effect or complicating features. 2. Advanced underlying chronic small vessel disease. Electronically Signed: By: Odessa Fleming M.D. On: 11/22/2021 07:09    Cardiac Studies   2D echo pending __________  2D echo 07/09/2019: 1. Left ventricular  ejection fraction, by estimation, is 50 to 55%. The  left ventricle has low normal function. The left ventricle demonstrates  global hypokinesis. There is mild left ventricular hypertrophy. Left  ventricular diastolic parameters are  consistent with Grade II diastolic dysfunction (pseudonormalization).   2. Right ventricular systolic function is normal. The right ventricular  size is mildly enlarged. Tricuspid regurgitation signal is inadequate for  assessing PA pressure.   3. Left atrial size was mildly dilated.  Patient Profile     51 y.o. male with history of cardiomyopathy with prior EF of 45% in 06/2017 subsequently improved to 65% in 08/2017, CVA in 08/2017, CKD stage III, DM2, HTN, medication noncompliance, and OSA nonadherent to CPAP who we are seeing for hypertensive emergency and HFpEF.  Assessment & Plan    Hypertensive emergency: -In the setting of medication noncompliance -Has not been taking his medications for approximately 1 year  -Presenting with markedly elevated pressures, headache, ganglia hemorrhage -Blood pressure improving on nicardipine infusion, wean as tolerated -Continue irbesartan 300 daily, carvedilol at lower dose 12.5 twice daily (heart rate precludes titration) -He has hydralazine ordered to start this morning, assess response -Would avoid calcium channel blocker/amlodipine in the setting of chronic leg swelling -Medication adherence is important   2. History of chronic diastolic CHF -Chronic leg edema -Transitioned from IV Lasix to torsemide today   3. CKD stage IIIa: -In the setting of poorly controlled hypertension, poorly controlled diabetes type2 -Appears to be stable   4. Poorly controlled diabetes type 2 -In the setting of medication noncompliance -A1c greater than 10 -Long history of medication noncompliance and poor control   5. Morbid obesity with OSA: -He would benefit from consultation with nutritionist -Does not appear particularly  motivated -Noncompliant with CPAP, adherence recommended   6. Leg swelling -Likely component of lymphedema -Skin changes consistent with chronic stasis -Recommend compression hose, he reports "they hurt too much"      For questions or updates, please contact [facility] Please consult [email or web address] for contact info under Cardiology/STEMI.    Signed, Eula Listen, PA-C [facility] Pager: [phone] 11/24/2021, 7:59 AM

## 2021-11-24 NOTE — TOC Progression Note (Signed)
Transition of Care [facility]) - Progression Note    Patient Details  Name: Jeremiah Vance MRN: [id number] Date of Birth: 10-24-1970  Transition of Care [facility]) CM/SW Contact  Allayne Butcher, RN Phone Number: 11/24/2021, 4:18 PM  Clinical Narrative:    Patient is from home where he lives with his sister, he has had 2 previous strokes.  He works at [employer].  Sister provides transportation, he hasn't driven in 4 years.  PT and OT are recommending home health services.  Patient agrees to [facility] depending on how much the copay may be.  Referral given to [facility], they will check to see if they can accept.   Patient does not have a PCP but agrees to being set up with one.  He is okay with TOC calling [facility] to see if they can accept him as a new patient, will not be able to call until Monday as the office is closed over the weekend.   Bariatric Walker ordered, and 3 in 1 ordered, when patient is able to discharge TOC will  have [employer] deliver to the room.     Expected Discharge Plan: Home w Home Health Services Barriers to Discharge: Continued Medical Work up  Expected Discharge Plan and Services Expected Discharge Plan: Home w Home Health Services   Discharge Planning Services: CM Consult Post Acute Care Choice: Home Health Living arrangements for the past 2 months: Single Family Home                 DME Arranged: Walker rolling, 3-N-1 DME Agency: [facility]       HH Arranged: PT, OT HH Agency: [facility] Date HH Agency Contacted: 11/24/21 Time HH Agency Contacted: 1616 Representative spoke with at [facility] Agency: weekend [employer]   Social Determinants of Health (SDOH) Interventions    Readmission Risk Interventions     No data to display

## 2021-11-24 NOTE — Progress Notes (Signed)
PHARMACY CONSULT NOTE  Pharmacy Consult for Electrolyte Monitoring and Replacement   Recent Labs: Potassium (mmol/L)  Date Value  11/24/2021 4.3   Magnesium (mg/dL)  Date Value  40/12/2723 2.1   Calcium (mg/dL)  Date Value  36/64/4034 8.5 (L)   Albumin (g/dL)  Date Value  74/25/9563 3.1 (L)  05/19/2020 3.6 (L)   Phosphorus (mg/dL)  Date Value  87/56/4332 4.8 (H)   Sodium (mmol/L)  Date Value  11/24/2021 135  05/19/2020 137    Assessment:  51 y.o. male with a past medical history of prior CVA in 2019, CHF, CKD, hypertension, hyperlipidemia, obesity, diabetes, presents to the emergency department with a headache and chest pain. Pharmacy is asked to follow and replace electrolytes while in CCU   Diuretics: Torsemide 20 mg po daily  Goal of Therapy:  Electrolytes within normal limits  Plan:  --Electrolytes have been stable and not requiring replacement; will discontinue electrolyte consult at this time --Defer further ordering of labs and electrolyte replacement to primary team --Pharmacy will continue to follow peripherally  Tressie Ellis 11/24/2021 9:35 AM

## 2021-11-24 NOTE — Progress Notes (Signed)
[facility]  - [place] at [facility]   PATIENT NAME: Jeremiah Vance    MR#:  [id number]  DATE OF BIRTH:  [birth year]  SUBJECTIVE:  No family at bedside On IV nicardipine gtt for BP control Tolerating po diet Legally blind left eye   VITALS:  Blood pressure 130/70, pulse 60, temperature 98.1 F (36.7 C), temperature source Oral, resp. rate (!) 29, height 5\' 10"  (1.778 m), weight (!) 167.8 kg, SpO2 93 %.  PHYSICAL EXAMINATION:   GENERAL:  51 y.o.-year-old patient lying in the bed with no acute distress. Morbidly obese LUNGS: Normal breath sounds bilaterally, no wheezing, rales, rhonchi.  CARDIOVASCULAR: S1, S2 normal. No murmurs, rubs, or gallops.  ABDOMEN: Soft, nontender, nondistended. Bowel sounds present.  EXTREMITIES: No  edema b/l.    NEUROLOGIC: nonfocal  patient is alert and awake, blind  left eye, no focal deficit SKIN:per RN   LABORATORY PANEL:  CBC Recent Labs  Lab 11/23/21 0449  WBC 9.7  HGB 13.1  HCT 41.4  PLT 299     Chemistries  Recent Labs  Lab 11/22/21 1158 11/23/21 0449 11/24/21 0319  NA  --    < > 135  K  --    < > 4.3  CL  --    < > 104  CO2  --    < > 26  GLUCOSE  --    < > 174*  BUN  --    < > 33*  CREATININE  --    < > 1.57*  CALCIUM  --    < > 8.5*  MG 1.8   < > 2.1  AST 16  --   --   ALT 15  --   --   ALKPHOS 70  --   --   BILITOT 1.2  --   --    < > = values in this interval not displayed.    Cardiac Enzymes No results for input(s): "TROPONINI" in the last 168 hours. RADIOLOGY:  MR BRAIN WO CONTRAST  Result Date: 11/23/2021 CLINICAL DATA:  Stroke, follow-up. EXAM: MRI HEAD WITHOUT CONTRAST TECHNIQUE: Multiplanar, multiecho pulse sequences of the brain and surrounding structures were obtained without intravenous contrast. COMPARISON:  Head CT November 22, 2021. FINDINGS: Brain: A 2.0 x 1.8 x 0.9 cm intraparenchymal hematoma within the right putamen/external capsule with mild surrounding vasogenic edema. A focus of  restricted diffusion/cytotoxic edema is seen within the right corona radiata, right above the hemorrhagic lesion. Findings appear stable when compared to prior head CT. Foci of susceptibility artifact are seen in the bilateral temporal lobe, right occipital lobe right thalamus and left putamen consistent with prior microhemorrhages. Remote lacunar infarcts in the right centrum semiovale and corona radiata. Scattered and confluent foci of T2 hyperintensity scattered within the white matter of the cerebral hemispheres and within the pons, nonspecific, most likely related to chronic microangiopathy no hydrocephalus. Vascular: Normal flow voids. Skull and upper cervical spine: Normal marrow signal. Sinuses/Orbits: FLAIR hyperintensity is noted on the left side of the right globe and less evident posteriorly in the left globe (series 15, images 14 and 15). Paranasal sinuses are clear. Other: None. IMPRESSION: 1. Stable appearance of right putamen/external capsule hematoma compared to recent CT head with mild surrounding vasogenic edema without significant mass effect. 2. Focus of restricted diffusion is seen within the right corona radiata right above the hematoma, corresponding to acute infarct. 3. FLAIR hyperintensity is noted on the left side of the right globe  and less evident posteriorly in the left globe may represent small retinal detachments. Correlation with fundoscopic exam suggested. 4. Mild-to-moderate chronic white matter disease. Electronically Signed   By: Pedro Earls M.D.   On: 11/23/2021 14:56   ECHOCARDIOGRAM COMPLETE  Result Date: 11/23/2021    ECHOCARDIOGRAM REPORT   Patient Name:   Jeremiah Vance Date of Exam: 11/23/2021 Medical Rec #:  [id number]       Height:       70.0 in Accession #:    [id number]      Weight:       369.9 lb Date of Birth:  [birth year]       BSA:          2.711 m Patient Age:    51 years        BP:           157/57 mmHg Patient Gender: M               HR:            72 bpm. Exam Location:  [facility] Procedure: 2D Echo, Color Doppler and Cardiac Doppler Indications:     Abnormal ECG R94.31  History:         Patient has prior history of Echocardiogram examinations, most                  recent 07/09/2019. CHF; Risk Factors:Hypertension.  Sonographer:     Sherrie Sport Referring Phys:  [id number] Teressa Lower Diagnosing Phys: Ida Rogue MD  Sonographer Comments: Suboptimal apical window. IMPRESSIONS  1. Left ventricular ejection fraction, by estimation, is 30 to 35%. The left ventricle has moderately decreased function. The left ventricle demonstrates global hypokinesis. There is severe left ventricular hypertrophy. Left ventricular diastolic parameters are consistent with Grade I diastolic dysfunction (impaired relaxation).  2. Right ventricular systolic function is mildly reduced. The right ventricular size is mildly enlarged. There is normal pulmonary artery systolic pressure. The estimated right ventricular systolic pressure is [id number] mmHg.  3. Left atrial size was moderately dilated.  4. The mitral valve is normal in structure. No evidence of mitral valve regurgitation. No evidence of mitral stenosis.  5. The aortic valve is normal in structure. Aortic valve regurgitation is not visualized. Aortic valve sclerosis is present, with no evidence of aortic valve stenosis.  6. There is mild dilatation of the aortic root, measuring 42 mm.  7. The inferior vena cava is normal in size with greater than 50% respiratory variability, suggesting right atrial pressure of 3 mmHg.  8. Challenging images FINDINGS  Left Ventricle: Left ventricular ejection fraction, by estimation, is 30 to 35%. The left ventricle has moderately decreased function. The left ventricle demonstrates global hypokinesis. The left ventricular internal cavity size was normal in size. There is severe left ventricular hypertrophy. Left ventricular diastolic parameters are consistent with Grade I diastolic dysfunction  (impaired relaxation). Right Ventricle: The right ventricular size is mildly enlarged. No increase in right ventricular wall thickness. Right ventricular systolic function is mildly reduced. There is normal pulmonary artery systolic pressure. The tricuspid regurgitant velocity  is 1.78 m/s, and with an assumed right atrial pressure of 10 mmHg, the estimated right ventricular systolic pressure is [id number] mmHg. Left Atrium: Left atrial size was moderately dilated. Right Atrium: Right atrial size was normal in size. Pericardium: There is no evidence of pericardial effusion. Mitral Valve: The mitral valve is normal in structure. Mild mitral annular calcification. No evidence of  mitral valve regurgitation. No evidence of mitral valve stenosis. Tricuspid Valve: The tricuspid valve is normal in structure. Tricuspid valve regurgitation is mild . No evidence of tricuspid stenosis. Aortic Valve: The aortic valve is normal in structure. Aortic valve regurgitation is not visualized. Aortic valve sclerosis is present, with no evidence of aortic valve stenosis. Aortic valve mean gradient measures 2.0 mmHg. Aortic valve peak gradient measures 4.4 mmHg. Aortic valve area, by VTI measures 2.49 cm. Pulmonic Valve: The pulmonic valve was normal in structure. Pulmonic valve regurgitation is not visualized. No evidence of pulmonic stenosis. Aorta: The aortic root is normal in size and structure. There is mild dilatation of the aortic root, measuring 42 mm. Venous: The inferior vena cava is normal in size with greater than 50% respiratory variability, suggesting right atrial pressure of 3 mmHg. IAS/Shunts: No atrial level shunt detected by color flow Doppler.  LEFT VENTRICLE PLAX 2D LVIDd:         5.20 cm   Diastology LVIDs:         3.70 cm   LV e' medial:    3.48 cm/s LV PW:         1.90 cm   LV E/e' medial:  21.6 LV IVS:        2.00 cm   LV e' lateral:   4.03 cm/s LVOT diam:     2.20 cm   LV E/e' lateral: 18.6 LV SV:         43 LV SV  Index:   16 LVOT Area:     3.80 cm  RIGHT VENTRICLE RV S prime:     13.70 cm/s LEFT ATRIUM             Index        RIGHT ATRIUM           Index LA diam:        4.70 cm 1.73 cm/m   RA Area:     28.20 cm LA Vol (A2C):   86.7 ml 31.98 ml/m  RA Volume:   90.70 ml  33.46 ml/m LA Vol (A4C):   95.2 ml 35.12 ml/m LA Biplane Vol: 95.4 ml 35.19 ml/m  AORTIC VALVE AV Area (Vmax):    2.72 cm AV Area (Vmean):   2.34 cm AV Area (VTI):     2.49 cm AV Vmax:           105.00 cm/s AV Vmean:          70.400 cm/s AV VTI:            0.174 m AV Peak Grad:      4.4 mmHg AV Mean Grad:      2.0 mmHg LVOT Vmax:         75.10 cm/s LVOT Vmean:        43.400 cm/s LVOT VTI:          0.114 m LVOT/AV VTI ratio: 0.66  AORTA Ao Root diam: 4.20 cm MITRAL VALVE               TRICUSPID VALVE MV Area (PHT): 3.07 cm    TR Peak grad:   12.7 mmHg MV Decel Time: 247 msec    TR Vmax:        178.00 cm/s MV E velocity: 75.00 cm/s MV A velocity: 79.70 cm/s  SHUNTS MV E/A ratio:  0.94        Systemic VTI:  0.11 m  Systemic Diam: 2.20 cm Ida Rogue MD Electronically signed by Ida Rogue MD Signature Date/Time: 11/23/2021/10:33:25 AM    Final     Assessment and Plan  Pio D Gala is an 51 y.o. male with a PMHx of 2 prior strokes, CKD, anemia, arthritis, CHF, Chicken pox, hypercholesterolemia, cardiomyopathy, hyperbilirubinemia, HTN, morbid obesity and DM2 who presented to the ED this morning with left sided CP accompanied by frontal HA and "numbness" to fingers of left hand since last night  CT head revealed a small right basal ganglia ICH with a small rim of adjacent vasogenic edema. The appearance is most consistent with a hypertensive hemorrhage.   MRI Brain Stable appearance of right putamen/external capsule hematoma compared to recent CT head with mild surrounding vasogenic edema without significant mass effect. 2. Focus of restricted diffusion is seen within the right corona radiata right above the  hematoma, corresponding to acute infarct. 3. FLAIR hyperintensity is noted on the left side of the right globe and less evident posteriorly in the left globe may represent small retinal detachments. Correlation with fundoscopic exam suggested. 4. Mild-to-moderate chronic white matter disease.  Acute CVA, hemorrhagic right basal ganglia with right external capsule/putamen hematoma hypertensive emergency medication noncompliance recent fall -- patient currently on IV Nicardipine drip -- On  irbesartan and Coreg --added hydralazine -- seen by Dr. Cheral Marker neurology --Urine drug screen positive for tricyclic antidepressant -- follow-up PT OT -- appreciate speech therapy input  Chronic diastolic/systolic congestive heart failure -- seen by [place] cardiology Dr. Rockey Situ. -- Not seem to be in heart failure -- received one dose of Lasix -- resume torsemide once creatinine stable --wean to RA  Diabetes type II, uncontrolled due to medication noncompliance, chronic kidney disease stage IIIa -- seen by diabetes coordinator -- will consider doing Lantus, Amaryl, metformin due to patient affordability and insurance coverage --A 1C 10.6 --sliding scale insulin  Chronic kidney disease stage IIIa -- creatinine stable -- avoid nephrotoxic agents --Replace electrolytes as indicated  Morbid obesity suspected sleep apnea -- patient advised lifestyle and dietary changes -- sleep study as outpatient  Procedures: Family communication :none Consults :Neurology CODE STATUS: full DVT Prophylaxis :SCD Level of care: ICU Status is: Inpatient Remains inpatient appropriate because: Hemorrahgic stroke on cardene gtt    TOTAL TIME TAKING CARE OF THIS PATIENT: 35 minutes.  >50% time spent on counselling and coordination of care  Note: This dictation was prepared with Dragon dictation along with smaller phrase technology. Any transcriptional errors that result from this process are  unintentional.  Fritzi Mandes M.D    [facility]   CC: Primary care physician; Doreen Beam, FNP (Inactive)

## 2021-11-24 NOTE — Evaluation (Signed)
Occupational Therapy Evaluation Patient Details Name: Jeremiah Vance MRN: [id number] DOB: 11/11/1970 Today's Date: 11/24/2021   History of Present Illness Pt is a 51 y/o M admitted on 11/22/21 after presenting with c/o frontal HA & numbness in L fingers that began the night prior. Upon arrival, BP 198/110 & CT Head revealed a small acute 3 ml Right basal ganglia hemorrhage without significant mass effect or complicating features. Pt is being treated for Small acute right basal ganglia hemorrhage secondary to hypertensive emergency due to medication noncompliance vs recent fall. PMH: ischemic stroke with R sided residual weakness (2019), CKD, depression, cardiomyopathy, HTN, morbid obesity, DM2, lymphadema   Clinical Impression   Jeremiah Vance was seen for OT evaluation this date. Prior to hospital admission, pt was Independent for mobility and ADLs. Pt lives with sister in 2 level town home. Pt presents to acute OT demonstrating impaired ADL performance and functional mobility 2/2 decreased activity tolerance and functional strength/ROM/balance deficits. Pt currently requires CGA for toilet t/f, no AD use and increased time. MOD I pericare in sitting with lateral leans. MOD I don/doff gown in sitting, anticipate assist for LBD. Pt would benefit from skilled OT to address noted impairments and functional limitations (see below for any additional details). Upon hospital discharge, recommend HHOT to maximize pt safety and return to PLOF.    Recommendations for follow up therapy are one component of a multi-disciplinary discharge planning process, led by the attending physician.  Recommendations may be updated based on patient status, additional functional criteria and insurance authorization.   Follow Up Recommendations  Home health OT    Assistance Recommended at Discharge Set up Supervision/Assistance  Patient can return home with the following A little help with bathing/dressing/bathroom;Help with  stairs or ramp for entrance    Functional Status Assessment  Patient has had a recent decline in their functional status and demonstrates the ability to make significant improvements in function in a reasonable and predictable amount of time.  Equipment Recommendations  BSC/3in1    Recommendations for Other Services       Precautions / Restrictions Precautions Precautions: Fall Restrictions Weight Bearing Restrictions: No      Mobility Bed Mobility Overal bed mobility: Modified Independent                  Transfers Overall transfer level: Needs assistance Equipment used: None Transfers: Sit to/from Stand Sit to Stand: Supervision           General transfer comment: SBA      Balance Overall balance assessment: Needs assistance Sitting-balance support: Feet supported, No upper extremity supported Sitting balance-Leahy Scale: Good     Standing balance support: No upper extremity supported, During functional activity Standing balance-Leahy Scale: Fair                             ADL either performed or assessed with clinical judgement   ADL Overall ADL's : Needs assistance/impaired                                       General ADL Comments: SBA for toilet t/f - increased time with no AD use. MOD I pericare in sitting with lateral leans. MOD I don/doff gown in sitting, anticipate assist for LBD.      Pertinent Vitals/Pain Pain Assessment Pain Assessment: Faces Faces Pain Scale: Hurts  a little bit Pain Location: chest - reports telemetry lines tugging on hair Pain Descriptors / Indicators: Discomfort, Aching Pain Intervention(s): Monitored during session, Repositioned     Hand Dominance     Extremity/Trunk Assessment Upper Extremity Assessment Upper Extremity Assessment: RUE deficits/detail RUE Deficits / Details: grossly 4/5   Lower Extremity Assessment Lower Extremity Assessment: Generalized weakness        Communication Communication Communication: No difficulties   Cognition Arousal/Alertness: Awake/alert Behavior During Therapy: WFL for tasks assessed/performed Overall Cognitive Status: Within Functional Limits for tasks assessed                                       General Comments  SpO2 90s on 4L [place]    Exercises     Shoulder Instructions      Home Living Family/patient expects to be discharged to:: Private residence Living Arrangements: Other relatives (sister) Available Help at Discharge: Available PRN/intermittently;Family Type of Home:  (townhouse) Home Access:  (flat entrance at front)     Home Layout: Two level;Bed/bath upstairs Alternate Level Stairs-Number of Steps: flight Alternate Level Stairs-Rails: Right           Home Equipment: None   Additional Comments: reports can sleep in recliner on 1st floor if needed  Lives With: Family (sister)    Prior Functioning/Environment Prior Level of Function : Independent/Modified Independent;Working/employed             Mobility Comments: Pt is independent without AD, doesn't drive (sister takes him places), works as a Holiday representative at [employer].          OT Problem List: Decreased strength;Decreased range of motion;Decreased activity tolerance;Impaired balance (sitting and/or standing);Decreased safety awareness      OT Treatment/Interventions: Self-care/ADL training;Therapeutic exercise;Energy conservation;DME and/or AE instruction;Therapeutic activities;Patient/family education;Balance training    OT Goals(Current goals can be found in the care plan section) Acute Rehab OT Goals Patient Stated Goal: to go home OT Goal Formulation: With patient Time For Goal Achievement: 12/08/21 Potential to Achieve Goals: Good ADL Goals Pt Will Perform Grooming: Independently;standing Pt Will Perform Lower Body Dressing: Independently;sit to/from stand Pt Will Transfer to Toilet:  Independently;ambulating;regular height toilet  OT Frequency: Min 4X/week    Co-evaluation              AM-PAC OT "6 Clicks" Daily Activity     Outcome Measure Help from another person eating meals?: None Help from another person taking care of personal grooming?: A Little Help from another person toileting, which includes using toliet, bedpan, or urinal?: A Little Help from another person bathing (including washing, rinsing, drying)?: A Little Help from another person to put on and taking off regular upper body clothing?: None Help from another person to put on and taking off regular lower body clothing?: A Little 6 Click Score: 20   End of Session    Activity Tolerance: Patient tolerated treatment well Patient left: in bed;with call bell/phone within reach  OT Visit Diagnosis: Other abnormalities of gait and mobility (R26.89);Muscle weakness (generalized) (M62.81)                Time: 0240-9735 OT Time Calculation (min): 28 min Charges:  OT General Charges $OT Visit: 1 Visit OT Evaluation $OT Eval Moderate Complexity: 1 Mod OT Treatments $Self Care/Home Management : 8-22 mins  Kathie Dike, M.S. OTR/L  11/24/21, 12:59 PM  ascom [phone]

## 2021-11-24 NOTE — Evaluation (Signed)
Physical Therapy Evaluation Patient Details Name: Jeremiah Vance MRN: [id number] DOB: [birth year] Today's Date: 11/24/2021  History of Present Illness  Pt is a 51 y/o M admitted on 11/22/21 after presenting with c/o frontal HA & numbness in L fingers that began the night prior. Upon arrival, BP 198/110 & CT Head revealed a small acute 3 ml Right basal ganglia hemorrhage without significant mass effect or complicating features. Pt is being treated for Small acute right basal ganglia hemorrhage secondary to hypertensive emergency due to medication noncompliance vs recent fall. 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 On this date, pt endorses BLE & back pain 2/2 fall prior to admission (reports this is the only one he's had in 6 months). Pt is able to complete supine>sit with HOB elevated & mod I & ambulates around bed without AD with min assist with impaired gait pattern & balance. Provided pt with RW & pt able to ambulate around bed again with supervision & improving balance. Pt does endorse fatigue & requests to sit. Pt notes his sister can take a day or 2 off upon his d/c & he can sleep in recliner on main floor.  Anticipate pt can d/c home with HHPT but encouraged pt to ambulate while in acute setting prior to d/c.   Recommendations for follow up therapy are one component of a multi-disciplinary discharge planning process, led by the attending physician.  Recommendations may be updated based on patient status, additional functional criteria and insurance authorization.  Follow Up Recommendations Home health PT      Assistance Recommended at Discharge Intermittent Supervision/Assistance  Patient can return home with the following  A little help with walking  and/or transfers;A little help with bathing/dressing/bathroom;Assistance with cooking/housework;Assist for transportation;Help with stairs or ramp for entrance    Equipment Recommendations Rolling walker (2 wheels) (bariatric RW)  Recommendations for Other Services       Functional Status Assessment Patient has had a recent decline in their functional status and demonstrates the ability to make significant improvements in function in a reasonable and predictable amount of time.     Precautions / Restrictions Precautions Precautions: Fall Restrictions Weight Bearing Restrictions: No      Mobility  Bed Mobility Overal bed mobility: Modified Independent             General bed mobility comments: supine>sit with bed rails, HOB elevated    Transfers Overall transfer level: Needs assistance Equipment used: None Transfers: Sit to/from Stand Sit to Stand: Supervision           General transfer comment: STS from EOB with 1UE on bed rail    Ambulation/Gait Ambulation/Gait assistance: Min assist Gait Distance (Feet): 10 Feet (+ 10 ft) Assistive device: Rolling walker (2 wheels), None   Gait velocity: decreased     General Gait Details: Pt ambulates around bed without AD with decreased gait speed, slow guarded gait, decreased step & stride length, wide BOS, decreased heel strike. Provided pt with RW & pt ambulates with slightly increased step length & stride length & improved balance.  Stairs            Wheelchair Mobility    Modified Rankin (Stroke Patients Only)       Balance Overall balance assessment: Needs assistance Sitting-balance  support: Feet supported, No upper extremity supported Sitting balance-Leahy Scale: Fair     Standing balance support: During functional activity, No upper extremity supported Standing balance-Leahy Scale: Fair                               Pertinent Vitals/Pain Pain Assessment Pain Assessment:  Faces Faces Pain Scale: Hurts a little bit Pain Location: BLE & back (2/2 fall) Pain Descriptors / Indicators: Discomfort, Aching Pain Intervention(s): Monitored during session    Home Living Family/patient expects to be discharged to:: Private residence Living Arrangements: Other relatives (sister) Available Help at Discharge: Available PRN/intermittently;Family Type of Home:  (townhouse) Home Access:  (flat entrance at front)     Alternate Level Stairs-Number of Steps: flight Home Layout: Two level;Bed/bath upstairs Home Equipment: None      Prior Function Prior Level of Function : Independent/Modified Independent;Working/employed             Mobility Comments: Pt is independent without AD, doesn't drive (sister takes him places), works as a Holiday representative at [employer].       Hand Dominance        Extremity/Trunk Assessment   Upper Extremity Assessment Upper Extremity Assessment: Overall WFL for tasks assessed    Lower Extremity Assessment Lower Extremity Assessment: Generalized weakness       Communication   Communication: No difficulties  Cognition Arousal/Alertness: Awake/alert Behavior During Therapy: WFL for tasks assessed/performed Overall Cognitive Status: Within Functional Limits for tasks assessed                                          General Comments General comments (skin integrity, edema, etc.): Pt on 4L/min, titrated down to 2L/min per nurse approval. Lowest SpO2 88% after STS with pt c/o feeling woozy but symptoms & SpO2 improving.    Exercises     Assessment/Plan    PT Assessment Patient needs continued PT services  PT Problem List Decreased strength;Cardiopulmonary status limiting activity;Decreased activity tolerance;Decreased mobility;Decreased balance;Decreased knowledge of use of DME       PT Treatment Interventions DME instruction;Therapeutic exercise;Gait training;Balance training;Stair training;Neuromuscular  re-education;Functional mobility training;Therapeutic activities;Patient/family education    PT Goals (Current goals can be found in the Care Plan section)  Acute Rehab PT Goals Patient Stated Goal: get better PT Goal Formulation: With patient Time For Goal Achievement: 12/08/21 Potential to Achieve Goals: Good    Frequency 7X/week     Co-evaluation               AM-PAC PT "6 Clicks" Mobility  Outcome Measure Help needed turning from your back to your side while in a flat bed without using bedrails?: None Help needed moving from lying on your back to sitting on the side of a flat bed without using bedrails?: A Little Help needed moving to and from a bed to a chair (including a wheelchair)?: A Little Help needed standing up from a chair using your arms (e.g., wheelchair or bedside chair)?: A Little Help needed to walk in hospital room?: A Little Help needed climbing 3-5 steps with a railing? : A Little 6 Click Score: 19    End of Session Equipment Utilized During Treatment: Oxygen Activity Tolerance: Patient limited by fatigue Patient left: in bed;with call bell/phone within reach Nurse Communication: Mobility status (need for continuing to ambulate) PT  Visit Diagnosis: Muscle weakness (generalized) (M62.81);Unsteadiness on feet (R26.81)    Time: 9518-8416 PT Time Calculation (min) (ACUTE ONLY): 13 min   Charges:   PT Evaluation $PT Eval Moderate Complexity: 1 Mod          Aleda Grana, PT, DPT 11/24/21, 12:29 PM   Sandi Mariscal 11/24/2021, 12:20 PM

## 2021-11-24 NOTE — Plan of Care (Signed)
Pt with no s/s of stroke progression

## 2021-11-25 ENCOUNTER — Inpatient Hospital Stay: Payer: BC Managed Care – PPO

## 2021-11-25 DIAGNOSIS — I5020 Unspecified systolic (congestive) heart failure: Secondary | ICD-10-CM | POA: Diagnosis not present

## 2021-11-25 DIAGNOSIS — I629 Nontraumatic intracranial hemorrhage, unspecified: Secondary | ICD-10-CM | POA: Diagnosis not present

## 2021-11-25 DIAGNOSIS — I161 Hypertensive emergency: Secondary | ICD-10-CM | POA: Diagnosis not present

## 2021-11-25 DIAGNOSIS — E118 Type 2 diabetes mellitus with unspecified complications: Secondary | ICD-10-CM | POA: Diagnosis not present

## 2021-11-25 LAB — GLUCOSE, CAPILLARY
Glucose-Capillary: 82 mg/dL (ref 70–99)
Glucose-Capillary: 152 mg/dL — ABNORMAL HIGH (ref 70–99)
Glucose-Capillary: 70 mg/dL (ref 70–99)
Glucose-Capillary: 84 mg/dL (ref 70–99)

## 2021-11-25 LAB — BASIC METABOLIC PANEL
Anion gap: 10 (ref 5–15)
BUN: 32 mg/dL — ABNORMAL HIGH (ref 6–20)
CO2: 26 mmol/L (ref 22–32)
Calcium: 8.7 mg/dL — ABNORMAL LOW (ref 8.9–10.3)
Chloride: 101 mmol/L (ref 98–111)
Creatinine, Ser: 1.58 mg/dL — ABNORMAL HIGH (ref 0.61–1.24)
GFR, Estimated: 53 mL/min — ABNORMAL LOW (ref >60)
Glucose, Bld: 124 mg/dL — ABNORMAL HIGH (ref 70–99)
Potassium: 4.2 mmol/L (ref 3.5–5.1)
Sodium: 137 mmol/L (ref 135–145)

## 2021-11-25 MED ORDER — INSULIN GLARGINE-YFGN 100 UNIT/ML ~~LOC~~ SOLN
15.0000 [IU] | Freq: Every day | SUBCUTANEOUS | Status: DC
Start: 2021-11-26 — End: 2021-11-26
  Filled 2021-11-25: qty 0.15

## 2021-11-25 MED ORDER — IOHEXOL 350 MG/ML SOLN
75.0000 mL | Freq: Once | INTRAVENOUS | Status: AC | PRN
Start: 2021-11-25 — End: 2021-11-25
  Administered 2021-11-25: 75 mL via INTRAVENOUS

## 2021-11-25 MED ORDER — ALUM & MAG HYDROXIDE-SIMETH 200-200-20 MG/5ML PO SUSP: 15.0000 mL | ORAL | Status: DC | PRN

## 2021-11-25 MED ORDER — FUROSEMIDE 10 MG/ML IJ SOLN
40.0000 mg | Freq: Two times a day (BID) | INTRAMUSCULAR | Status: DC
  Administered 2021-11-25 – 2021-11-26 (×2): 40 mg via INTRAVENOUS
  Filled 2021-11-25 (×2): qty 4

## 2021-11-25 MED ORDER — FUROSEMIDE 10 MG/ML IJ SOLN
40.0000 mg | Freq: Once | INTRAMUSCULAR | Status: AC
Start: 2021-11-25 — End: 2021-11-25
  Administered 2021-11-25: 40 mg via INTRAVENOUS
  Filled 2021-11-25: qty 4

## 2021-11-25 MED ORDER — ALUM & MAG HYDROXIDE-SIMETH 200-200-20 MG/5ML PO SUSP
15.0000 mL | Freq: Once | ORAL | Status: AC
  Administered 2021-11-25: 15 mL via ORAL
  Filled 2021-11-25: qty 30

## 2021-11-25 NOTE — Progress Notes (Signed)
Patient is not able to walk the distance required to go the bathroom, or he/she is unable to safely negotiate stairs required to access the bathroom.  A 3in1 BSC will alleviate this problem.  Kemper Durie, Charity fundraiser, MSN, CCM [facility] TOC

## 2021-11-25 NOTE — Progress Notes (Signed)
[facility]  - [place] [facility]   PATIENT NAME: Jeremiah Vance    MR#:  [id number]  DATE OF BIRTH:  [birth year]  SUBJECTIVE:  No family at bedside Tolerating po diet Legally blind left eye Worked with PT   VITALS:  Blood pressure (!) 120/58, pulse 64, temperature 97.6 F (36.4 C), temperature source Oral, resp. rate 16, height 5\' 10"  (1.778 m), weight (!) 167.8 kg, SpO2 98 %.  PHYSICAL EXAMINATION:   GENERAL:  51 y.o.-year-old patient lying in the bed with no acute distress. Morbidly obese LUNGS: Normal breath sounds bilaterally, no wheezing, rales, rhonchi.  CARDIOVASCULAR: S1, S2 normal. No murmurs, rubs, or gallops.  ABDOMEN: Soft, nontender, nondistended. Bowel sounds present.  EXTREMITIES: No  edema b/l.    NEUROLOGIC: nonfocal  patient is alert and awake, blind  left eye, no focal deficit SKIN:per RN   LABORATORY PANEL:  CBC Recent Labs  Lab 11/23/21 0449  WBC 9.7  HGB 13.1  HCT 41.4  PLT 299     Chemistries  Recent Labs  Lab 11/22/21 1158 11/23/21 0449 11/24/21 0319  NA  --    < > 135  K  --    < > 4.3  CL  --    < > 104  CO2  --    < > 26  GLUCOSE  --    < > 174*  BUN  --    < > 33*  CREATININE  --    < > 1.57*  CALCIUM  --    < > 8.5*  MG 1.8   < > 2.1  AST 16  --   --   ALT 15  --   --   ALKPHOS 70  --   --   BILITOT 1.2  --   --    < > = values in this interval not displayed.    Cardiac Enzymes No results for input(s): "TROPONINI" in the last 168 hours. RADIOLOGY:  MR BRAIN WO CONTRAST  Result Date: 11/23/2021 CLINICAL DATA:  Stroke, follow-up. EXAM: MRI HEAD WITHOUT CONTRAST TECHNIQUE: Multiplanar, multiecho pulse sequences of the brain and surrounding structures were obtained without intravenous contrast. COMPARISON:  Head CT November 22, 2021. FINDINGS: Brain: A 2.0 x 1.8 x 0.9 cm intraparenchymal hematoma within the right putamen/external capsule with mild surrounding vasogenic edema. A focus of restricted  diffusion/cytotoxic edema is seen within the right corona radiata, right above the hemorrhagic lesion. Findings appear stable when compared to prior head CT. Foci of susceptibility artifact are seen in the bilateral temporal lobe, right occipital lobe right thalamus and left putamen consistent with prior microhemorrhages. Remote lacunar infarcts in the right centrum semiovale and corona radiata. Scattered and confluent foci of T2 hyperintensity scattered within the white matter of the cerebral hemispheres and within the pons, nonspecific, most likely related to chronic microangiopathy no hydrocephalus. Vascular: Normal flow voids. Skull and upper cervical spine: Normal marrow signal. Sinuses/Orbits: FLAIR hyperintensity is noted on the left side of the right globe and less evident posteriorly in the left globe (series 15, images 14 and 15). Paranasal sinuses are clear. Other: None. IMPRESSION: 1. Stable appearance of right putamen/external capsule hematoma compared to recent CT head with mild surrounding vasogenic edema without significant mass effect. 2. Focus of restricted diffusion is seen within the right corona radiata right above the hematoma, corresponding to acute infarct. 3. FLAIR hyperintensity is noted on the left side of the right globe and less evident posteriorly  in the left globe may represent small retinal detachments. Correlation with fundoscopic exam suggested. 4. Mild-to-moderate chronic white matter disease. Electronically Signed   By: Baldemar Lenis M.D.   On: 11/23/2021 14:56    Assessment and Plan  Jeremiah Vance is an 51 y.o. male with a PMHx of 2 prior strokes, CKD, anemia, arthritis, CHF, Chicken pox, hypercholesterolemia, cardiomyopathy, hyperbilirubinemia, HTN, morbid obesity and DM2 who presented to the ED this morning with left sided CP accompanied by frontal HA and "numbness" to fingers of left hand since last night  CT head revealed a small right basal ganglia ICH  with a small rim of adjacent vasogenic edema. The appearance is most consistent with a hypertensive hemorrhage.   MRI Brain Stable appearance of right putamen/external capsule hematoma compared to recent CT head with mild surrounding vasogenic edema without significant mass effect. 2. Focus of restricted diffusion is seen within the right corona radiata right above the hematoma, corresponding to acute infarct. 3. FLAIR hyperintensity is noted on the left side of the right globe and less evident posteriorly in the left globe may represent small retinal detachments. Correlation with fundoscopic exam suggested. 4. Mild-to-moderate chronic white matter disease.  Acute CVA, hemorrhagic right basal ganglia with right external capsule/putamen hematoma hypertensive emergency medication noncompliance recent fall -- patient currently on IV Nicardipine drip--now weaned off -- On  irbesartan  Coreg, hydralazine -- seen by Dr. Otelia Limes neurology --Urine drug screen positive for tricyclic antidepressant -- follow-up PT OT--HH recomemnded -- appreciate speech therapy input  Acute systolic congestive heart failure -- seen by [facility] cardiology Dr. Mariah Milling. -- Not seem to be in heart failure -- received one dose of Lasix--Cards recommends IV lasix 40 mg bid --wean to RA --EF 30-35% with Grade I diastolic dysfunction --assess for home oxygen need  Diabetes type II, uncontrolled due to medication noncompliance, chronic kidney disease stage IIIa -- seen by diabetes coordinator -- will consider doing Lantus, Amaryl, metformin due to patient affordability and insurance coverage --A 1C 10.6 --sliding scale insulin  Chronic kidney disease stage IIIa -- creatinine stable -- avoid nephrotoxic agents --Replace electrolytes as indicated  Morbid obesity suspected sleep apnea -- patient advised lifestyle and dietary changes -- sleep study as outpatient  Procedures: Family communication :none Consults  :Neurology, [facility] cardiology CODE STATUS: full DVT Prophylaxis :SCD Level of care: Med-Surg Status is: Inpatient Remains inpatient appropriate because: getting IV lasix for CHF per cardiology recs  Likely d/c 1-2 days    TOTAL TIME TAKING CARE OF THIS PATIENT: 35 minutes.  >50% time spent on counselling and coordination of care  Note: This dictation was prepared with Dragon dictation along with smaller phrase technology. Any transcriptional errors that result from this process are unintentional.  Enedina Finner M.D    [facility]   CC: Primary care physician; Berniece Pap, FNP (Inactive)

## 2021-11-25 NOTE — Progress Notes (Signed)
Rounding Note    Patient Name: Jeremiah Vance Date of Encounter: 11/25/2021  [facility] Cardiologist: Debbe Odea, MD   Subjective   Has some shortness of breath this morning, while sitting in chair.  States urinating adequately still with edema in the leg.  Thinks he may have sleep apnea.  Inpatient Medications    Scheduled Meds:  carvedilol  12.5 mg Oral BID WC   Chlorhexidine Gluconate Cloth  6 each Topical Daily   furosemide  40 mg Intravenous BID   glimepiride  4 mg Oral Q breakfast   hydrALAZINE  50 mg Oral TID   insulin aspart  0-20 Units Subcutaneous TID WC   insulin aspart  0-5 Units Subcutaneous QHS   [START ON 11/26/2021] insulin glargine-yfgn  15 Units Subcutaneous Daily   irbesartan  300 mg Oral Daily   linagliptin  5 mg Oral Daily   senna-docusate  1 tablet Oral BID   Continuous Infusions:  PRN Meds: acetaminophen, docusate sodium, hydrALAZINE, ondansetron (ZOFRAN) IV, polyethylene glycol   Vital Signs    Vitals:   11/25/21 0050 11/25/21 0511 11/25/21 0824 11/25/21 1218  BP: 133/67 135/67 137/71 (!) 120/58  Pulse: 63 64 (!) 57 64  Resp: 18 18 18 16   Temp: 98.6 F (37 C) 98.2 F (36.8 C) 97.7 F (36.5 C) 97.6 F (36.4 C)  TempSrc:    Oral  SpO2: 98% 94% 99% 98%  Weight:      Height:        Intake/Output Summary (Last 24 hours) at 11/25/2021 1235 Last data filed at 11/24/2021 1525 Gross per 24 hour  Intake 83.59 ml  Output --  Net 83.59 ml      11/24/2021    5:00 AM 11/23/2021    4:22 AM 11/22/2021    6:09 AM  Last 3 Weights  Weight (lbs) 369 lb 14.9 oz 369 lb 14.9 oz 370 lb  Weight (kg) 167.8 kg 167.8 kg 167.831 kg      Telemetry    Currently off telemetry- Personally Reviewed  ECG     - Personally Reviewed  Physical Exam   GEN: Mild respiratory distress Neck: No JVD Cardiac: RRR, no murmurs, rubs, or gallops.  Respiratory: Clear to auscultation bilaterally. GI: Soft, nontender, distended  MS: 1+ edema; No  deformity. Neuro:  Nonfocal  Psych: Normal affect   Labs    High Sensitivity Troponin:   Recent Labs  Lab 11/22/21 0628 11/22/21 0837  TROPONINIHS 44* 40*     Chemistry Recent Labs  Lab 11/22/21 0628 11/22/21 1158 11/23/21 0449 11/24/21 0319  NA 136  --  137 135  K 4.1  --  4.2 4.3  CL 104  --  106 104  CO2 25  --  24 26  GLUCOSE 226*  --  194* 174*  BUN 26*  --  31* 33*  CREATININE 1.36*  --  1.47* 1.57*  CALCIUM 8.5*  --  8.6* 8.5*  MG  --  1.8 2.2 2.1  PROT  --  7.2  --   --   ALBUMIN  --  3.1*  --   --   AST  --  16  --   --   ALT  --  15  --   --   ALKPHOS  --  70  --   --   BILITOT  --  1.2  --   --   GFRNONAA >60  --  57* 53*  ANIONGAP 7  --  7 5    Lipids  Recent Labs  Lab 11/22/21 1158  CHOL 162  TRIG 135  HDL 30*  LDLCALC 105*  CHOLHDL 5.4    Hematology Recent Labs  Lab 11/22/21 0628 11/22/21 0743 11/23/21 0449  WBC 9.9 9.4 9.7  RBC 5.24 5.09 4.69  HGB 14.5 14.0 13.1  HCT 45.8 44.6 41.4  MCV 87.4 87.6 88.3  MCH 27.7 27.5 27.9  MCHC 31.7 31.4 31.6  RDW 13.1 13.1 13.3  PLT 312 305 299   Thyroid  Recent Labs  Lab 11/22/21 1408  TSH 0.842    BNP Recent Labs  Lab 11/22/21 0628  BNP 606.8*    DDimer No results for input(s): "DDIMER" in the last 168 hours.   Radiology    MR BRAIN WO CONTRAST  Result Date: 11/23/2021 CLINICAL DATA:  Stroke, follow-up. EXAM: MRI HEAD WITHOUT CONTRAST TECHNIQUE: Multiplanar, multiecho pulse sequences of the brain and surrounding structures were obtained without intravenous contrast. COMPARISON:  Head CT November 22, 2021. FINDINGS: Brain: A 2.0 x 1.8 x 0.9 cm intraparenchymal hematoma within the right putamen/external capsule with mild surrounding vasogenic edema. A focus of restricted diffusion/cytotoxic edema is seen within the right corona radiata, right above the hemorrhagic lesion. Findings appear stable when compared to prior head CT. Foci of susceptibility artifact are seen in the bilateral  temporal lobe, right occipital lobe right thalamus and left putamen consistent with prior microhemorrhages. Remote lacunar infarcts in the right centrum semiovale and corona radiata. Scattered and confluent foci of T2 hyperintensity scattered within the white matter of the cerebral hemispheres and within the pons, nonspecific, most likely related to chronic microangiopathy no hydrocephalus. Vascular: Normal flow voids. Skull and upper cervical spine: Normal marrow signal. Sinuses/Orbits: FLAIR hyperintensity is noted on the left side of the right globe and less evident posteriorly in the left globe (series 15, images 14 and 15). Paranasal sinuses are clear. Other: None. IMPRESSION: 1. Stable appearance of right putamen/external capsule hematoma compared to recent CT head with mild surrounding vasogenic edema without significant mass effect. 2. Focus of restricted diffusion is seen within the right corona radiata right above the hematoma, corresponding to acute infarct. 3. FLAIR hyperintensity is noted on the left side of the right globe and less evident posteriorly in the left globe may represent small retinal detachments. Correlation with fundoscopic exam suggested. 4. Mild-to-moderate chronic white matter disease. Electronically Signed   By: Baldemar Lenis M.D.   On: 11/23/2021 14:56    Cardiac Studies   TTE 11/23/2021 1. Left ventricular ejection fraction, by estimation, is 30 to 35%. The  left ventricle has moderately decreased function. The left ventricle  demonstrates global hypokinesis. There is severe left ventricular  hypertrophy. Left ventricular diastolic  parameters are consistent with Grade I diastolic dysfunction (impaired  relaxation).   2. Right ventricular systolic function is mildly reduced. The right  ventricular size is mildly enlarged. There is normal pulmonary artery  systolic pressure. The estimated right ventricular systolic pressure is  22.7 mmHg.   3. Left  atrial size was moderately dilated.   4. The mitral valve is normal in structure. No evidence of mitral valve  regurgitation. No evidence of mitral stenosis.   5. The aortic valve is normal in structure. Aortic valve regurgitation is  not visualized. Aortic valve sclerosis is present, with no evidence of  aortic valve stenosis.   6. There is mild dilatation of the aortic root, measuring 42 mm.   7. The  inferior vena cava is normal in size with greater than 50%  respiratory variability, suggesting right atrial pressure of 3 mmHg.   8. Challenging images   Patient Profile     51 y.o. male  with history of HFpEF, hypertension, CKD 3, morbid obesity, CVA presenting with chest pain and headaches, admitted with hypertensive urgency, acute hemorrhagic infarct being seen for newly reduced EF and hypertension.  Assessment & Plan    1.  Newly reduced EF 30 to 35% -Edema, abdominal distention, shortness of breath -Stop p.o. torsemide, start IV Lasix 40 mg twice daily -Continue Coreg, irbesartan. -Defer left heart cath for now due to renal insufficiency, intracranial hemorrhage.  Repeat echo as outpatient, if EF stays low, consider left heart cath.   2.  Hypertension -BP improved, neurology recommends BP goal [id number] systolic -Coreg [id number] mg twice daily -Irbesartan 300 mg daily -Hydralazine  50 mg 3 times daily   3.  Intracranial hemorrhage -Avoid antiplatelets -BP control as above -Neurology following  4.  Obesity, shortness of breath -Patient may have sleep apnea -Recommend pulmonary eval, may benefit from CPAP at night     Greater than 50% was spent in counseling and coordination of care with patient Total encounter time 50 minutes or more       Signed, Kate Sable, MD  11/25/2021, 12:35 PM

## 2021-11-25 NOTE — Progress Notes (Signed)
Physical Therapy Treatment Patient Details Name: Jeremiah Vance MRN: [id number] DOB: December 26, 1970 Today's Date: 11/25/2021   History of Present Illness Pt is a 51 y/o M admitted on 11/22/21 after presenting with c/o frontal HA & numbness in L fingers that began the night prior. Upon arrival, BP 198/110 & CT Head revealed a small acute 3 ml Right basal ganglia hemorrhage without significant mass effect or complicating features. Pt is being treated for Small acute right basal ganglia hemorrhage secondary to hypertensive emergency due to medication noncompliance vs recent fall. PMH: ischemic stroke with R sided residual weakness (2019), CKD, depression, cardiomyopathy, HTN, morbid obesity, DM2, lymphadema    PT Comments    OOB with rail and able to walk 50' in hallway with RW and min guard.  Self selected gait distance and speed.  Overall does well today with walker and increased distances.  He does have some dizziness upon sitting and standing that clears with time.  Remained in chair with needs met after session.   Recommendations for follow up therapy are one component of a multi-disciplinary discharge planning process, led by the attending physician.  Recommendations may be updated based on patient status, additional functional criteria and insurance authorization.  Follow Up Recommendations  Home health PT     Assistance Recommended at Discharge Intermittent Supervision/Assistance  Patient can return home with the following A little help with walking and/or transfers;A little help with bathing/dressing/bathroom;Assistance with cooking/housework;Assist for transportation;Help with stairs or ramp for entrance   Equipment Recommendations  Rolling walker (2 wheels) (bariatric RW)    Recommendations for Other Services       Precautions / Restrictions Precautions Precautions: Fall Restrictions Weight Bearing Restrictions: No     Mobility  Bed Mobility Overal bed mobility: Modified  Independent             General bed mobility comments: supine>sit with bed rails, HOB elevated    Transfers Overall transfer level: Needs assistance Equipment used: None Transfers: Sit to/from Stand Sit to Stand: Supervision           General transfer comment: SBA    Ambulation/Gait Ambulation/Gait assistance: Min guard Gait Distance (Feet): 50 Feet Assistive device: Rolling walker (2 wheels) Gait Pattern/deviations: Step-through pattern, Decreased step length - right, Decreased step length - left, Wide base of support Gait velocity: decreased -occasional standing rest breaks     General Gait Details: ambulating to/from bathroom in room without AD but uses RW in hallway.   Stairs             Wheelchair Mobility    Modified Rankin (Stroke Patients Only)       Balance Overall balance assessment: Needs assistance Sitting-balance support: Feet supported, No upper extremity supported Sitting balance-Leahy Scale: Good     Standing balance support: No upper extremity supported, Bilateral upper extremity supported Standing balance-Leahy Scale: Fair                              Cognition Arousal/Alertness: Awake/alert Behavior During Therapy: WFL for tasks assessed/performed Overall Cognitive Status: Within Functional Limits for tasks assessed                                          Exercises      General Comments        Pertinent Vitals/Pain Pain Assessment  Pain Assessment: Faces Faces Pain Scale: Hurts a little bit Pain Location: leg Pain Descriptors / Indicators: Discomfort, Aching Pain Intervention(s): Limited activity within patient's tolerance, Monitored during session, Repositioned    Home Living                          Prior Function            PT Goals (current goals can now be found in the care plan section) Progress towards PT goals: Progressing toward goals    Frequency     7X/week      PT Plan      Co-evaluation              AM-PAC PT "6 Clicks" Mobility   Outcome Measure  Help needed turning from your back to your side while in a flat bed without using bedrails?: None Help needed moving from lying on your back to sitting on the side of a flat bed without using bedrails?: A Little Help needed moving to and from a bed to a chair (including a wheelchair)?: None Help needed standing up from a chair using your arms (e.g., wheelchair or bedside chair)?: None Help needed to walk in hospital room?: A Little Help needed climbing 3-5 steps with a railing? : A Little 6 Click Score: 21    End of Session Equipment Utilized During Treatment: Oxygen Activity Tolerance: Patient limited by fatigue Patient left: in bed;with call bell/phone within reach Nurse Communication: Mobility status (need for continuing to ambulate) PT Visit Diagnosis: Muscle weakness (generalized) (M62.81);Unsteadiness on feet (R26.81)     Time: 7014-1030 PT Time Calculation (min) (ACUTE ONLY): 16 min  Charges:  $Gait Training: 8-22 mins                   Chesley Noon, PTA 11/25/21, 9:37 AM

## 2021-11-25 NOTE — TOC Progression Note (Addendum)
Transition of Care [facility]) - Progression Note    Patient Details  Name: AJMAL KATHAN MRN: [id number] Date of Birth: [birth year]  Transition of Care [facility]) CM/SW Contact  Kemper Durie, RN Phone Number: 11/25/2021, 1:35 PM  Clinical Narrative:     Per MD, plan to discharge tomorrow.  Call placed to [facility] with [facility] to place referral, voice message left with need for HHPT/OT services.   Also reached out to [facility] with [facility].  TOC team will continue to follow.   Update 1413: [facility] able to accept patient for PT/OT services.  Spoke with [place] with [facility], 3n1 and bariatric walker will be delivered to bedside before discharge tomorrow.  Expected Discharge Plan: Home w Home Health Services Barriers to Discharge: Continued Medical Work up  Expected Discharge Plan and Services Expected Discharge Plan: Home w Home Health Services   Discharge Planning Services: CM Consult Post Acute Care Choice: Home Health Living arrangements for the past 2 months: Single Family Home                 DME Arranged: Walker rolling, 3-N-1 DME Agency: [facility]       HH Arranged: PT, OT HH Agency: [facility] Date HH Agency Contacted: 11/24/21 Time HH Agency Contacted: 1616 Representative spoke with at [facility] Agency: weekend [employer]   Social Determinants of Health (SDOH) Interventions    Readmission Risk Interventions     No data to display

## 2021-11-25 NOTE — Progress Notes (Signed)
       CROSS COVER NOTE  NAME: KRISTOFOR MICHALOWSKI MRN: [id number] DOB : 1971-01-11    Date of Service   11/25/2021   HPI/Events of Note   Medication request received for indigestion symptoms  Interventions   Plan: Maalox x1     This document was prepared using Dragon voice recognition software and may include unintentional dictation errors.  Bishop Limbo DNP, MHA, FNP-BC Nurse Practitioner [facility] Pager [phone]

## 2021-11-25 NOTE — Progress Notes (Signed)
Home oxygen testing. Performed by Christianne Dolin, RN.   Room air at rest: 93%. Room air on ambulation :99%

## 2021-11-26 DIAGNOSIS — I161 Hypertensive emergency: Secondary | ICD-10-CM | POA: Diagnosis not present

## 2021-11-26 LAB — GLUCOSE, CAPILLARY
Glucose-Capillary: 68 mg/dL — ABNORMAL LOW (ref 70–99)
Glucose-Capillary: 88 mg/dL (ref 70–99)

## 2021-11-26 MED ORDER — TRESIBA FLEXTOUCH 200 UNIT/ML ~~LOC~~ SOPN: 15.0000 [IU] | PEN_INJECTOR | Freq: Every day | SUBCUTANEOUS | 3 refills | Status: DC

## 2021-11-26 MED ORDER — ASPIRIN 81 MG PO TBEC
81.0000 mg | DELAYED_RELEASE_TABLET | Freq: Every day | ORAL | Status: DC
  Administered 2021-11-26: 81 mg via ORAL
  Filled 2021-11-26: qty 1

## 2021-11-26 MED ORDER — ATORVASTATIN CALCIUM 40 MG PO TABS: 40.0000 mg | ORAL_TABLET | Freq: Every day | ORAL | 3 refills | Status: DC

## 2021-11-26 MED ORDER — HYDRALAZINE HCL 50 MG PO TABS: 50.0000 mg | ORAL_TABLET | Freq: Three times a day (TID) | ORAL | 3 refills | Status: DC

## 2021-11-26 MED ORDER — INSULIN GLARGINE-YFGN 100 UNIT/ML ~~LOC~~ SOLN: 15.0000 [IU] | Freq: Every day | SUBCUTANEOUS | 11 refills | Status: DC

## 2021-11-26 MED ORDER — TORSEMIDE 20 MG PO TABS
40.0000 mg | ORAL_TABLET | Freq: Every day | ORAL | Status: DC
  Administered 2021-11-26: 40 mg via ORAL
  Filled 2021-11-26: qty 2

## 2021-11-26 MED ORDER — ASPIRIN 81 MG PO TBEC: 81.0000 mg | DELAYED_RELEASE_TABLET | Freq: Every day | ORAL | 12 refills | Status: DC

## 2021-11-26 MED ORDER — GLIMEPIRIDE 4 MG PO TABS: 4.0000 mg | ORAL_TABLET | Freq: Every day | ORAL | 3 refills | Status: DC

## 2021-11-26 MED ORDER — TORSEMIDE 20 MG PO TABS: 40.0000 mg | ORAL_TABLET | Freq: Every day | ORAL | 3 refills | Status: DC

## 2021-11-26 MED ORDER — LINAGLIPTIN 5 MG PO TABS: 5.0000 mg | ORAL_TABLET | Freq: Every day | ORAL | 3 refills | Status: DC

## 2021-11-26 MED ORDER — CARVEDILOL 12.5 MG PO TABS: 12.5000 mg | ORAL_TABLET | Freq: Two times a day (BID) | ORAL | 3 refills | Status: DC

## 2021-11-26 MED ORDER — IRBESARTAN 300 MG PO TABS: 300.0000 mg | ORAL_TABLET | Freq: Every day | ORAL | 3 refills | Status: DC

## 2021-11-26 NOTE — Progress Notes (Signed)
Physical Therapy Treatment Patient Details Name: Jeremiah Vance MRN: [id number] DOB: 12-31-1970 Today's Date: 11/26/2021   History of Present Illness Pt is a 51 y/o M admitted on 11/22/21 after presenting with c/o frontal HA & numbness in L fingers that began the night prior. Upon arrival, BP 198/110 & CT Head revealed a small acute 3 ml Right basal ganglia hemorrhage without significant mass effect or complicating features. Pt is being treated for Small acute right basal ganglia hemorrhage secondary to hypertensive emergency due to medication noncompliance vs recent fall. PMH: ischemic stroke with R sided residual weakness (2019), CKD, depression, cardiomyopathy, HTN, morbid obesity, DM2, lymphadema    PT Comments    OOB and progressed gait with RW to unit secretary with RW and min guard/supervision.  Steady gait with walker.  Recommendations for follow up therapy are one component of a multi-disciplinary discharge planning process, led by the attending physician.  Recommendations may be updated based on patient status, additional functional criteria and insurance authorization.  Follow Up Recommendations  Home health PT     Assistance Recommended at Discharge Intermittent Supervision/Assistance  Patient can return home with the following A little help with walking and/or transfers;A little help with bathing/dressing/bathroom;Assistance with cooking/housework;Assist for transportation;Help with stairs or ramp for entrance   Equipment Recommendations  Rolling walker (2 wheels) (bariatric RW)    Recommendations for Other Services       Precautions / Restrictions Precautions Precautions: Fall Restrictions Weight Bearing Restrictions: No     Mobility  Bed Mobility Overal bed mobility: Modified Independent             General bed mobility comments: supine>sit with bed rails, HOB elevated    Transfers Overall transfer level: Needs assistance Equipment used: None Transfers:  Sit to/from Stand Sit to Stand: Supervision           General transfer comment: SBA    Ambulation/Gait Ambulation/Gait assistance: Min guard, Supervision Gait Distance (Feet): 90 Feet Assistive device: Rolling walker (2 wheels) Gait Pattern/deviations: Step-through pattern, Decreased step length - right, Decreased step length - left, Wide base of support Gait velocity: decreased -occasional standing rest breaks     General Gait Details: ambulating to/from bathroom in room without AD but uses RW in hallway.   Stairs             Wheelchair Mobility    Modified Rankin (Stroke Patients Only)       Balance Overall balance assessment: Needs assistance Sitting-balance support: Feet supported, No upper extremity supported Sitting balance-Leahy Scale: Good     Standing balance support: No upper extremity supported, Bilateral upper extremity supported Standing balance-Leahy Scale: Fair                              Cognition Arousal/Alertness: Awake/alert Behavior During Therapy: WFL for tasks assessed/performed Overall Cognitive Status: Within Functional Limits for tasks assessed                                          Exercises      General Comments        Pertinent Vitals/Pain Pain Assessment Pain Assessment: No/denies pain    Home Living                          Prior Function  PT Goals (current goals can now be found in the care plan section) Progress towards PT goals: Progressing toward goals    Frequency    7X/week      PT Plan      Co-evaluation              AM-PAC PT "6 Clicks" Mobility   Outcome Measure  Help needed turning from your back to your side while in a flat bed without using bedrails?: None Help needed moving from lying on your back to sitting on the side of a flat bed without using bedrails?: A Little Help needed moving to and from a bed to a chair (including a  wheelchair)?: None Help needed standing up from a chair using your arms (e.g., wheelchair or bedside chair)?: None Help needed to walk in hospital room?: None Help needed climbing 3-5 steps with a railing? : A Little 6 Click Score: 22    End of Session Equipment Utilized During Treatment: Gait belt Activity Tolerance: Patient limited by fatigue Patient left: in bed;with call bell/phone within reach Nurse Communication: Mobility status (need for continuing to ambulate) PT Visit Diagnosis: Muscle weakness (generalized) (M62.81);Unsteadiness on feet (R26.81)     Time: 5993-5701 PT Time Calculation (min) (ACUTE ONLY): 9 min  Charges:  $Gait Training: 8-22 mins                   Danielle Dess, PTA 11/26/21, 9:05 AM

## 2021-11-26 NOTE — Inpatient Diabetes Management (Addendum)
Inpatient Diabetes Program Recommendations  AACE/ADA: New Consensus Statement on Inpatient Glycemic Control (2015)  Target Ranges:  Prepandial:   less than 140 mg/dL      Peak postprandial:   less than 180 mg/dL (1-2 hours)      Critically ill patients:  140 - 180 mg/dL    Latest Reference Range & Units 11/25/21 08:22 11/25/21 12:18 11/25/21 17:21 11/25/21 20:50  Glucose-Capillary 70 - 99 mg/dL 82    20 units Semglee 152 (H)  4 units Novolog  70 84  (H): Data is abnormally high  Latest Reference Range & Units 11/26/21 07:52  Glucose-Capillary 70 - 99 mg/dL 68 (L)  (L): Data is abnormally low    Home DM Meds: Jardiance 10 mg daily (NOT taking)                              Tresiba 10 units daily (NOT taking)   Current Orders: Novolog Resistant Correction Scale/ SSI (0-20 units) TID AC + HS      Semglee 15 units Daily        Amaryl 4 mg daily      Tradjenta 5 mg daily     Note CBG only 82 yesterday AM and down to 68 this AM  Agree with reduction of Semglee to 15 units Daily   MD- May also consider reducing the Novolog SSi to the Moderate 0-15 unit scale    Addendum 12:20pm--Mer w/ pt at bedside to discuss new diabetes meds for home at time of d/c.  Explained to pt that he will be going home on Lantus insulin once daily, Amaryl once daily, + Tradjenta once daily.  Explained what each med is, how they each work, when to take, etc.  Reviewed signs and symptoms of Hypoglycemia and how to treat at home.    Educated patient on insulin pen use at home.  Reviewed all steps of insulin pen including attachment of needle, 2-unit air shot, dialing up dose, giving injection, rotation of injection sites, removing needle, disposal of sharps, storage of unused insulin, disposal of insulin etc.  Patient able to provide successful return demonstration.  Reviewed troubleshooting with insulin pen.  Pt told me his sister is a Engineer, site and can help him as well.  Provided QR code with  Insulin pen video for pt to review with his sister when he goes home prior to 1st independent injection at home.     --Will follow patient during hospitalization--  Ambrose Finland RN, MSN, CDCES Diabetes Coordinator Inpatient Glycemic Control Team Team Pager: [phone] (8a-5p)

## 2021-11-26 NOTE — Discharge Summary (Signed)
Physician Discharge Summary   Patient: Jeremiah Vance MRN: [id number] DOB: [birth year]  Admit date:     11/22/2021  Discharge date: 11/26/21  Discharge Physician: Fritzi Mandes   PCP: Doreen Beam, FNP (Inactive)   Recommendations at discharge:   keep log of his sugars at home and review with PCP on ensure you take your blood pressure medication and keep log of BP at home so PCP can adjust meds follow-up with [facility] for your stroke in 1 to 2 weeks follow-up with PCP in 1 to 2 weeks follow-up with [facility] cardiology Dr Gollan/Dr Garen Lah  in 1 to 2 weeks  Discharge Diagnoses: acute CVA hemorrhagic right basal ganglia due to uncontrolled blood pressure hypertensive emergency acute congestive heart failure systolic  Hospital Course: Jeremiah Vance is an 51 y.o. male with a PMHx of 2 prior strokes, CKD, anemia, arthritis, CHF, Chicken pox, hypercholesterolemia, cardiomyopathy, hyperbilirubinemia, HTN, morbid obesity and DM2 who presented to the ED this morning with left sided CP accompanied by frontal HA and "numbness" to fingers of left hand since last night   CT head revealed a small right basal ganglia ICH with a small rim of adjacent vasogenic edema. The appearance is most consistent with a hypertensive hemorrhage.    MRI Brain Stable appearance of right putamen/external capsule hematoma compared to recent CT head with mild surrounding vasogenic edema without significant mass effect. 2. Focus of restricted diffusion is seen within the right corona radiata right above the hematoma, corresponding to acute infarct. 3. FLAIR hyperintensity is noted on the left side of the right globe and less evident posteriorly in the left globe may represent small retinal detachments. Correlation with fundoscopic exam suggested. 4. Mild-to-moderate chronic white matter disease.   Acute CVA, hemorrhagic right basal ganglia with right external capsule/putamen  hematoma hypertensive emergency medication noncompliance recent fall -- patient currently on IV Nicardipine drip--now weaned off -- On  irbesartan  Coreg, hydralazine -- seen by Drs. Lindzen/[facility] neurology --Urine drug screen positive for tricyclic antidepressant -- follow-up PT OT--HH recomemnded -- appreciate speech therapy input --cont present BP meds --OK to start ASA 81 mg qd for secondary prevention--d/w Dr Quinn Axe   Acute systolic congestive heart failure -- seen by [facility] cardiology Dr. Rockey Situ. -- Not seem to be in heart failure -- received one dose of Lasix--Cards recommends IV lasix 40 mg bid--good diuresis--->po torsemide 40 mg qd --weaned to RA --EF 30-35% with Grade I diastolic dysfunction -- okay from cardiology standpoint to discharge. Discussed with Dr. Fletcher Anon  Diabetes type II, uncontrolled due to medication noncompliance, chronic kidney disease stage IIIa -- seen by diabetes coordinator -- will consider doing Lantus, Amaryl, tradjenta due to patient affordability and insurance coverage --A 1C 10.6 --sliding scale insulin --avoiding metformin due to creat   Chronic kidney disease stage IIIa with DM-2 -- creatinine stable -- avoid nephrotoxic agents --Replace electrolytes as indicated   Morbid obesity suspected sleep apnea -- patient advised lifestyle and dietary changes -- sleep study as outpatient    Family communication :none Consults :Neurology, [facility] cardiology       Disposition: Home health Diet recommendation:  Discharge Diet Orders (From admission, onward)     Start     Ordered   11/26/21 0000  Diet - low sodium heart healthy        11/26/21 1117   11/26/21 0000  Diet Carb Modified        11/26/21 1117  Cardiac and Carb modified diet DISCHARGE MEDICATION: Allergies as of 11/26/2021       Reactions   Sulfa Antibiotics    Pollen Extract Other (See Comments)        Medication List     STOP taking these medications     amLODipine 2.5 MG tablet Commonly known as: NORVASC   empagliflozin 10 MG Tabs tablet Commonly known as: JARDIANCE       TAKE these medications    aspirin EC 81 MG tablet Take 1 tablet (81 mg total) by mouth daily. Swallow whole.   atorvastatin 40 MG tablet Commonly known as: LIPITOR Take 1 tablet (40 mg total) by mouth daily at 6 PM.   carvedilol 12.5 MG tablet Commonly known as: COREG Take 1 tablet (12.5 mg total) by mouth 2 (two) times daily with a meal. What changed:  medication strength how much to take additional instructions   glimepiride 4 MG tablet Commonly known as: AMARYL Take 1 tablet (4 mg total) by mouth daily with breakfast. Start taking on: November 27, 2021   hydrALAZINE 50 MG tablet Commonly known as: APRESOLINE Take 1 tablet (50 mg total) by mouth 3 (three) times daily.   insulin glargine-yfgn 100 UNIT/ML injection Commonly known as: SEMGLEE Inject 0.15 mLs (15 Units total) into the skin daily. Start taking on: November 27, 2021   irbesartan 300 MG tablet Commonly known as: Avapro Take 1 tablet (300 mg total) by mouth daily.   linagliptin 5 MG Tabs tablet Commonly known as: TRADJENTA Take 1 tablet (5 mg total) by mouth daily. Start taking on: November 27, 2021   torsemide 20 MG tablet Commonly known as: DEMADEX Take 2 tablets (40 mg total) by mouth daily.   Tyler Aas FlexTouch 200 UNIT/ML FlexTouch Pen Generic drug: insulin degludec Inject 16 Units into the skin daily. What changed: how much to take               Durable Medical Equipment  (From admission, onward)           Start     Ordered   11/24/21 1622  For home use only DME 3 n 1  Once       Comments: Bariatric  due to body habitus   11/24/21 1621   11/24/21 1441  For home use only DME Walker  Once       Comments: Bariatric Walker due to body habitus  Question:  Patient needs a walker to treat with the following condition  Answer:  Weakness generalized   11/24/21  1441            Follow-up Information     Flinchum, Kelby Aline, FNP. Schedule an appointment as soon as possible for a visit in 1 week(s).   Specialty: Family Medicine Why: hospital f/u Contact information: [address] [place] [address] [phone]         Kate Sable, MD .   Specialties: Cardiology, Radiology Contact information: [place] [address] [phone]         [place]. Call in 1 week(s).   Why: fro stroke f/u Contact information: [address]     [place] [phone]               Discharge Exam: Danley Danker Weights   11/22/21 [id number] 11/23/21 0422 11/24/21 0500  Weight: (!) 167.8 kg (!) 167.8 kg (!) 167.8 kg     Condition at discharge:  fair  The results of significant diagnostics from this hospitalization (including imaging, microbiology, ancillary and laboratory) are listed below for reference.   Imaging Studies: CT ANGIO HEAD NECK W WO CM  Result Date: 11/25/2021 CLINICAL DATA:  Intracranial hemorrhage EXAM: CT ANGIOGRAPHY HEAD AND NECK TECHNIQUE: Multidetector CT imaging of the head and neck was performed using the standard protocol during bolus administration of intravenous contrast. Multiplanar CT image reconstructions and MIPs were obtained to evaluate the vascular anatomy. Carotid stenosis measurements (when applicable) are obtained utilizing NASCET criteria, using the distal internal carotid diameter as the denominator. RADIATION DOSE REDUCTION: This exam was performed according to the departmental dose-optimization program which includes automated exposure control, adjustment of the mA and/or kV according to patient size and/or use of iterative reconstruction technique. CONTRAST:  73mL OMNIPAQUE IOHEXOL 350 MG/ML SOLN COMPARISON:  None Available. FINDINGS: CT HEAD FINDINGS Brain: Unchanged intraparenchymal hemorrhage of the  right basal ganglia. The size and configuration of the ventricles and extra-axial CSF spaces are normal. There is no acute or chronic infarction. The brain parenchyma is normal. Skull: The visualized skull base, calvarium and extracranial soft tissues are normal. Sinuses/Orbits: No fluid levels or advanced mucosal thickening of the visualized paranasal sinuses. No mastoid or middle ear effusion. The orbits are normal. CTA NECK FINDINGS SKELETON: There is no bony spinal canal stenosis. No lytic or blastic lesion. OTHER NECK: Normal pharynx, larynx and major salivary glands. No cervical lymphadenopathy. Unremarkable thyroid gland. UPPER CHEST: No pneumothorax or pleural effusion. No nodules or masses. AORTIC ARCH: There is no calcific atherosclerosis of the aortic arch. There is no aneurysm, dissection or hemodynamically significant stenosis of the visualized portion of the aorta. Conventional 3 vessel aortic branching pattern. The visualized proximal subclavian arteries are widely patent. RIGHT CAROTID SYSTEM: Normal without aneurysm, dissection or stenosis. LEFT CAROTID SYSTEM: Normal without aneurysm, dissection or stenosis. VERTEBRAL ARTERIES: Left dominant configuration. Both origins are clearly patent. There is no dissection, occlusion or flow-limiting stenosis to the skull base (V1-V3 segments). CTA HEAD FINDINGS POSTERIOR CIRCULATION: --Vertebral arteries: Normal V4 segments. --Inferior cerebellar arteries: Normal. --Basilar artery: Normal. --Superior cerebellar arteries: Normal. --Posterior cerebral arteries (PCA): Normal. ANTERIOR CIRCULATION: --Intracranial internal carotid arteries: Normal. --Anterior cerebral arteries (ACA): Normal. Both A1 segments are present. Patent anterior communicating artery (a-comm). --Middle cerebral arteries (MCA): Normal. VENOUS SINUSES: As permitted by contrast timing, patent. ANATOMIC VARIANTS: Fetal origin of the left posterior cerebral artery. Review of the MIP images  confirms the above findings. IMPRESSION: 1. Unchanged intraparenchymal hemorrhage of the right basal ganglia. 2. No aneurysm, dissection, occlusion or hemodynamically significant stenosis of the major cervical or intracranial arteries. Electronically Signed   By: Deatra Robinson M.D.   On: 11/25/2021 23:04   MR BRAIN WO CONTRAST  Result Date: 11/23/2021 CLINICAL DATA:  Stroke, follow-up. EXAM: MRI HEAD WITHOUT CONTRAST TECHNIQUE: Multiplanar, multiecho pulse sequences of the brain and surrounding structures were obtained without intravenous contrast. COMPARISON:  Head CT November 22, 2021. FINDINGS: Brain: A 2.0 x 1.8 x 0.9 cm intraparenchymal hematoma within the right putamen/external capsule with mild surrounding vasogenic edema. A focus of restricted diffusion/cytotoxic edema is seen within the right corona radiata, right above the hemorrhagic lesion. Findings appear stable when compared to prior head CT. Foci of susceptibility artifact are seen in the bilateral temporal lobe, right occipital lobe right thalamus and left putamen consistent with prior microhemorrhages. Remote lacunar infarcts in the right centrum semiovale and corona radiata. Scattered and confluent foci of T2 hyperintensity scattered within  the white matter of the cerebral hemispheres and within the pons, nonspecific, most likely related to chronic microangiopathy no hydrocephalus. Vascular: Normal flow voids. Skull and upper cervical spine: Normal marrow signal. Sinuses/Orbits: FLAIR hyperintensity is noted on the left side of the right globe and less evident posteriorly in the left globe (series 15, images 14 and 15). Paranasal sinuses are clear. Other: None. IMPRESSION: 1. Stable appearance of right putamen/external capsule hematoma compared to recent CT head with mild surrounding vasogenic edema without significant mass effect. 2. Focus of restricted diffusion is seen within the right corona radiata right above the hematoma, corresponding to  acute infarct. 3. FLAIR hyperintensity is noted on the left side of the right globe and less evident posteriorly in the left globe may represent small retinal detachments. Correlation with fundoscopic exam suggested. 4. Mild-to-moderate chronic white matter disease. Electronically Signed   By: Pedro Earls M.D.   On: 11/23/2021 14:56   ECHOCARDIOGRAM COMPLETE  Result Date: 11/23/2021    ECHOCARDIOGRAM REPORT   Patient Name:   Jeremiah Vance Date of Exam: 11/23/2021 Medical Rec #:  [id number]       Height:       70.0 in Accession #:    [id number]      Weight:       369.9 lb Date of Birth:  1970/06/19       BSA:          2.711 m Patient Age:    50 years        BP:           157/57 mmHg Patient Gender: M               HR:           72 bpm. Exam Location:  [facility] Procedure: 2D Echo, Color Doppler and Cardiac Doppler Indications:     Abnormal ECG R94.31  History:         Patient has prior history of Echocardiogram examinations, most                  recent 07/09/2019. CHF; Risk Factors:Hypertension.  Sonographer:     Sherrie Sport Referring Phys:  [id number] Teressa Lower Diagnosing Phys: Ida Rogue MD  Sonographer Comments: Suboptimal apical window. IMPRESSIONS  1. Left ventricular ejection fraction, by estimation, is 30 to 35%. The left ventricle has moderately decreased function. The left ventricle demonstrates global hypokinesis. There is severe left ventricular hypertrophy. Left ventricular diastolic parameters are consistent with Grade I diastolic dysfunction (impaired relaxation).  2. Right ventricular systolic function is mildly reduced. The right ventricular size is mildly enlarged. There is normal pulmonary artery systolic pressure. The estimated right ventricular systolic pressure is [id number] mmHg.  3. Left atrial size was moderately dilated.  4. The mitral valve is normal in structure. No evidence of mitral valve regurgitation. No evidence of mitral stenosis.  5. The aortic valve is normal in  structure. Aortic valve regurgitation is not visualized. Aortic valve sclerosis is present, with no evidence of aortic valve stenosis.  6. There is mild dilatation of the aortic root, measuring 42 mm.  7. The inferior vena cava is normal in size with greater than 50% respiratory variability, suggesting right atrial pressure of 3 mmHg.  8. Challenging images FINDINGS  Left Ventricle: Left ventricular ejection fraction, by estimation, is 30 to 35%. The left ventricle has moderately decreased function. The left ventricle demonstrates global hypokinesis. The left ventricular internal cavity size  was normal in size. There is severe left ventricular hypertrophy. Left ventricular diastolic parameters are consistent with Grade I diastolic dysfunction (impaired relaxation). Right Ventricle: The right ventricular size is mildly enlarged. No increase in right ventricular wall thickness. Right ventricular systolic function is mildly reduced. There is normal pulmonary artery systolic pressure. The tricuspid regurgitant velocity  is 1.78 m/s, and with an assumed right atrial pressure of 10 mmHg, the estimated right ventricular systolic pressure is [id number] mmHg. Left Atrium: Left atrial size was moderately dilated. Right Atrium: Right atrial size was normal in size. Pericardium: There is no evidence of pericardial effusion. Mitral Valve: The mitral valve is normal in structure. Mild mitral annular calcification. No evidence of mitral valve regurgitation. No evidence of mitral valve stenosis. Tricuspid Valve: The tricuspid valve is normal in structure. Tricuspid valve regurgitation is mild . No evidence of tricuspid stenosis. Aortic Valve: The aortic valve is normal in structure. Aortic valve regurgitation is not visualized. Aortic valve sclerosis is present, with no evidence of aortic valve stenosis. Aortic valve mean gradient measures 2.0 mmHg. Aortic valve peak gradient measures 4.4 mmHg. Aortic valve area, by VTI measures 2.49  cm. Pulmonic Valve: The pulmonic valve was normal in structure. Pulmonic valve regurgitation is not visualized. No evidence of pulmonic stenosis. Aorta: The aortic root is normal in size and structure. There is mild dilatation of the aortic root, measuring 42 mm. Venous: The inferior vena cava is normal in size with greater than 50% respiratory variability, suggesting right atrial pressure of 3 mmHg. IAS/Shunts: No atrial level shunt detected by color flow Doppler.  LEFT VENTRICLE PLAX 2D LVIDd:         5.20 cm   Diastology LVIDs:         3.70 cm   LV e' medial:    3.48 cm/s LV PW:         1.90 cm   LV E/e' medial:  21.6 LV IVS:        2.00 cm   LV e' lateral:   4.03 cm/s LVOT diam:     2.20 cm   LV E/e' lateral: 18.6 LV SV:         43 LV SV Index:   16 LVOT Area:     3.80 cm  RIGHT VENTRICLE RV S prime:     13.70 cm/s LEFT ATRIUM             Index        RIGHT ATRIUM           Index LA diam:        4.70 cm 1.73 cm/m   RA Area:     28.20 cm LA Vol (A2C):   86.7 ml 31.98 ml/m  RA Volume:   90.70 ml  33.46 ml/m LA Vol (A4C):   95.2 ml 35.12 ml/m LA Biplane Vol: 95.4 ml 35.19 ml/m  AORTIC VALVE AV Area (Vmax):    2.72 cm AV Area (Vmean):   2.34 cm AV Area (VTI):     2.49 cm AV Vmax:           105.00 cm/s AV Vmean:          70.400 cm/s AV VTI:            0.174 m AV Peak Grad:      4.4 mmHg AV Mean Grad:      2.0 mmHg LVOT Vmax:         75.10 cm/s LVOT Vmean:  43.400 cm/s LVOT VTI:          0.114 m LVOT/AV VTI ratio: 0.66  AORTA Ao Root diam: 4.20 cm MITRAL VALVE               TRICUSPID VALVE MV Area (PHT): 3.07 cm    TR Peak grad:   12.7 mmHg MV Decel Time: 247 msec    TR Vmax:        178.00 cm/s MV E velocity: 75.00 cm/s MV A velocity: 79.70 cm/s  SHUNTS MV E/A ratio:  0.94        Systemic VTI:  0.11 m                            Systemic Diam: 2.20 cm Julien Nordmann MD Electronically signed by Julien Nordmann MD Signature Date/Time: 11/23/2021/10:33:25 AM    Final    DG Chest 2 View  Result Date:  11/22/2021 CLINICAL DATA:  51 year old male with history of chest pain. EXAM: CHEST - 2 VIEW COMPARISON:  Chest x-ray 02/22/2020. FINDINGS: Lung volumes are low. No consolidative airspace disease. No pleural effusions. No pneumothorax. No pulmonary nodule or mass noted. Crowding of the pulmonary vasculature accentuated by low lung volumes, without frank pulmonary edema. Cardiomediastinal silhouette is within normal limits. IMPRESSION: 1. Low lung volumes without radiographic evidence of acute cardiopulmonary disease. Electronically Signed   By: Trudie Reed M.D.   On: 11/22/2021 07:31   CT Head Wo Contrast  Addendum Date: 11/22/2021   ADDENDUM REPORT: 11/22/2021 07:22 ADDENDUM: Study discussed by telephone with Dr. Lenard Lance on 11/22/2021 at 715 hours. Electronically Signed   By: Odessa Fleming M.D.   On: 11/22/2021 07:22   Result Date: 11/22/2021 CLINICAL DATA:  51 year old male with headache, left side chest pain. EXAM: CT HEAD WITHOUT CONTRAST TECHNIQUE: Contiguous axial images were obtained from the base of the skull through the vertex without intravenous contrast. RADIATION DOSE REDUCTION: This exam was performed according to the departmental dose-optimization program which includes automated exposure control, adjustment of the mA and/or kV according to patient size and/or use of iterative reconstruction technique. COMPARISON:  Head CT 10/24/2019.  Brain MRI 08/06/2018. FINDINGS: Brain: Small hyperdense hemorrhage in the right lentiform nuclei, oval and encompassing 21 x 12 x 22 mm (AP by transverse by CC) for an estimated blood volume of 3 mL. Acute blood is tracking into the subcortical white matter of the frontal operculum (series 4, image 38). No intraventricular or extra-axial extension. No significant intracranial mass effect or midline shift. No ventriculomegaly. Superimposed chronic lacunar infarcts in the bilateral thalami and left basal ganglia. Patchy additional white matter hypodensity. No  superimposed cortically based acute infarct identified. Basilar cisterns remain normal. Vascular: Calcified atherosclerosis at the skull base. No suspicious intracranial vascular hyperdensity. Skull: No acute osseous abnormality identified. Sinuses/Orbits: Visualized paranasal sinuses and mastoids are clear. Other: Visualized orbits and scalp soft tissues are within normal limits. IMPRESSION: 1. Positive for a small acute 3 mL Right Basal Ganglia Hemorrhage. No significant intracranial mass effect or complicating features. 2. Advanced underlying chronic small vessel disease. Electronically Signed: By: Odessa Fleming M.D. On: 11/22/2021 07:09    Microbiology: Results for orders placed or performed during the hospital encounter of 11/22/21  SARS Coronavirus 2 by RT PCR (hospital order, performed in [facility] hospital lab) *cepheid single result test* Anterior Nasal Swab     Status: None   Collection Time: 11/22/21 10:50 AM   Specimen:  Anterior Nasal Swab  Result Value Ref Range Status   SARS Coronavirus 2 by RT PCR NEGATIVE NEGATIVE Final    Comment: (NOTE) SARS-CoV-2 target nucleic acids are NOT DETECTED.  The SARS-CoV-2 RNA is generally detectable in upper and lower respiratory specimens during the acute phase of infection. The lowest concentration of SARS-CoV-2 viral copies this assay can detect is 250 copies / mL. A negative result does not preclude SARS-CoV-2 infection and should not be used as the sole basis for treatment or other patient management decisions.  A negative result may occur with improper specimen collection / handling, submission of specimen other than nasopharyngeal swab, presence of viral mutation(s) within the areas targeted by this assay, and inadequate number of viral copies (<250 copies / mL). A negative result must be combined with clinical observations, patient history, and epidemiological information.  Fact Sheet for Patients:    https://www.[place].info/  Fact Sheet for Healthcare Providers: https://hall.com/  This test is not yet approved or  cleared by the [place] FDA and has been authorized for detection and/or diagnosis of SARS-CoV-2 by FDA under an Emergency Use Authorization (EUA).  This EUA will remain in effect (meaning this test can be used) for the duration of the COVID-19 declaration under Section 564(b)(1) of the Act, 21 U.S.C. section 360bbb-3(b)(1), unless the authorization is terminated or revoked sooner.  Performed at [facility], [address] [place], [place] [address]   MRSA Next Gen by PCR, Nasal     Status: None   Collection Time: 11/22/21  2:01 PM   Specimen: Nasal Mucosa; Nasal Swab  Result Value Ref Range Status   MRSA by PCR Next Gen NOT DETECTED NOT DETECTED Final    Comment: (NOTE) The GeneXpert MRSA Assay (FDA approved for NASAL specimens only), is one component of a comprehensive MRSA colonization surveillance program. It is not intended to diagnose MRSA infection nor to guide or monitor treatment for MRSA infections. Test performance is not FDA approved in patients less than 70 years old. Performed at [facility], [address] [place], [place] [address]     Labs: CBC: Recent Labs  Lab 11/22/21 [phone] 11/22/21 0743 11/23/21 0449  WBC 9.9 9.4 9.7  NEUTROABS  --  6.3  --   HGB 14.5 14.0 13.1  HCT 45.8 44.6 41.4  MCV 87.4 87.6 88.3  PLT 312 305 [id number]   Basic Metabolic Panel: Recent Labs  Lab 11/22/21 0628 11/22/21 1158 11/23/21 0449 11/24/21 0319 11/25/21 1224  NA 136  --  137 135 137  K 4.1  --  4.2 4.3 4.2  CL 104  --  106 104 101  CO2 25  --  24 26 26   GLUCOSE 226*  --  194* 174* 124*  BUN 26*  --  31* 33* 32*  CREATININE 1.36*  --  1.47* 1.57* 1.58*  CALCIUM 8.5*  --  8.6* 8.5* 8.7*  MG  --  1.8 2.2 2.1  --   PHOS  --  2.4* 4.8*  --   --    Liver Function Tests: Recent Labs   Lab 11/22/21 1158  AST 16  ALT 15  ALKPHOS 70  BILITOT 1.2  PROT 7.2  ALBUMIN 3.1*   CBG: Recent Labs  Lab 11/25/21 0822 11/25/21 1218 11/25/21 1721 11/25/21 2050 11/26/21 0752  GLUCAP 82 152* 70 84 68*    Discharge time spent: greater than 30 minutes.  Signed: Fritzi Mandes, MD [facility] 11/26/2021

## 2021-11-26 NOTE — Progress Notes (Signed)
Rounding Note    Patient Name: Jeremiah Vance Date of Encounter: 11/26/2021   [facility] Cardiologist: Debbe Odea, MD   Subjective   Patient seen on AM rounds. Denies any chest pain or shortness of breath. Remains on room air. States that he has been urinating without difficulty overnight.   Inpatient Medications    Scheduled Meds:  carvedilol  12.5 mg Oral BID WC   Chlorhexidine Gluconate Cloth  6 each Topical Daily   furosemide  40 mg Intravenous BID   glimepiride  4 mg Oral Q breakfast   hydrALAZINE  50 mg Oral TID   insulin aspart  0-20 Units Subcutaneous TID WC   insulin aspart  0-5 Units Subcutaneous QHS   insulin glargine-yfgn  15 Units Subcutaneous Daily   irbesartan  300 mg Oral Daily   linagliptin  5 mg Oral Daily   senna-docusate  1 tablet Oral BID   Continuous Infusions:  PRN Meds: acetaminophen, docusate sodium, hydrALAZINE, ondansetron (ZOFRAN) IV, polyethylene glycol   Vital Signs    Vitals:   11/25/21 2016 11/25/21 2017 11/26/21 0455 11/26/21 0814  BP: (!) 142/66  139/80 128/68  Pulse: 74 72 63 63  Resp: 17 18 19 19   Temp: 98 F (36.7 C) 98 F (36.7 C) 97.8 F (36.6 C) 98.1 F (36.7 C)  TempSrc: Oral Oral  Oral  SpO2: 98% 98% 97% 98%  Weight:      Height:       No intake or output data in the 24 hours ending 11/26/21 0945    11/24/2021    5:00 AM 11/23/2021    4:22 AM 11/22/2021    6:09 AM  Last 3 Weights  Weight (lbs) 369 lb 14.9 oz 369 lb 14.9 oz 370 lb  Weight (kg) 167.8 kg 167.8 kg 167.831 kg      Telemetry    Sinus 60-70's - Personally Reviewed  ECG    No new tracings - Personally Reviewed  Physical Exam   GEN: No acute distress.   Neck: No JVD Cardiac: RRR, no murmurs, rubs, or gallops.  Respiratory: Clear to auscultation bilaterally. Respirations are unlabored on room air. GI: Soft, nontender, non-distended  MS: 1+ edema to BLE; No deformity. Neuro:  Nonfocal  Psych: Normal affect   Labs    High  Sensitivity Troponin:   Recent Labs  Lab 11/22/21 0628 11/22/21 0837  TROPONINIHS 44* 40*     Chemistry Recent Labs  Lab 11/22/21 1158 11/23/21 0449 11/24/21 0319 11/25/21 1224  NA  --  137 135 137  K  --  4.2 4.3 4.2  CL  --  106 104 101  CO2  --  24 26 26   GLUCOSE  --  194* 174* 124*  BUN  --  31* 33* 32*  CREATININE  --  1.47* 1.57* 1.58*  CALCIUM  --  8.6* 8.5* 8.7*  MG 1.8 2.2 2.1  --   PROT 7.2  --   --   --   ALBUMIN 3.1*  --   --   --   AST 16  --   --   --   ALT 15  --   --   --   ALKPHOS 70  --   --   --   BILITOT 1.2  --   --   --   GFRNONAA  --  57* 53* 53*  ANIONGAP  --  7 5 10     Lipids  Recent Labs  Lab 11/22/21 1158  CHOL 162  TRIG 135  HDL 30*  LDLCALC 105*  CHOLHDL 5.4    Hematology Recent Labs  Lab 11/22/21 0628 11/22/21 0743 11/23/21 0449  WBC 9.9 9.4 9.7  RBC 5.24 5.09 4.69  HGB 14.5 14.0 13.1  HCT 45.8 44.6 41.4  MCV 87.4 87.6 88.3  MCH 27.7 27.5 27.9  MCHC 31.7 31.4 31.6  RDW 13.1 13.1 13.3  PLT 312 305 299   Thyroid  Recent Labs  Lab 11/22/21 1408  TSH 0.842    BNP Recent Labs  Lab 11/22/21 0628  BNP 606.8*    DDimer No results for input(s): "DDIMER" in the last 168 hours.   Radiology    CT ANGIO HEAD NECK W WO CM  Result Date: 11/25/2021 CLINICAL DATA:  Intracranial hemorrhage EXAM: CT ANGIOGRAPHY HEAD AND NECK TECHNIQUE: Multidetector CT imaging of the head and neck was performed using the standard protocol during bolus administration of intravenous contrast. Multiplanar CT image reconstructions and MIPs were obtained to evaluate the vascular anatomy. Carotid stenosis measurements (when applicable) are obtained utilizing NASCET criteria, using the distal internal carotid diameter as the denominator. RADIATION DOSE REDUCTION: This exam was performed according to the departmental dose-optimization program which includes automated exposure control, adjustment of the mA and/or kV according to patient size and/or use of  iterative reconstruction technique. CONTRAST:  31mL OMNIPAQUE IOHEXOL 350 MG/ML SOLN COMPARISON:  None Available. FINDINGS: CT HEAD FINDINGS Brain: Unchanged intraparenchymal hemorrhage of the right basal ganglia. The size and configuration of the ventricles and extra-axial CSF spaces are normal. There is no acute or chronic infarction. The brain parenchyma is normal. Skull: The visualized skull base, calvarium and extracranial soft tissues are normal. Sinuses/Orbits: No fluid levels or advanced mucosal thickening of the visualized paranasal sinuses. No mastoid or middle ear effusion. The orbits are normal. CTA NECK FINDINGS SKELETON: There is no bony spinal canal stenosis. No lytic or blastic lesion. OTHER NECK: Normal pharynx, larynx and major salivary glands. No cervical lymphadenopathy. Unremarkable thyroid gland. UPPER CHEST: No pneumothorax or pleural effusion. No nodules or masses. AORTIC ARCH: There is no calcific atherosclerosis of the aortic arch. There is no aneurysm, dissection or hemodynamically significant stenosis of the visualized portion of the aorta. Conventional 3 vessel aortic branching pattern. The visualized proximal subclavian arteries are widely patent. RIGHT CAROTID SYSTEM: Normal without aneurysm, dissection or stenosis. LEFT CAROTID SYSTEM: Normal without aneurysm, dissection or stenosis. VERTEBRAL ARTERIES: Left dominant configuration. Both origins are clearly patent. There is no dissection, occlusion or flow-limiting stenosis to the skull base (V1-V3 segments). CTA HEAD FINDINGS POSTERIOR CIRCULATION: --Vertebral arteries: Normal V4 segments. --Inferior cerebellar arteries: Normal. --Basilar artery: Normal. --Superior cerebellar arteries: Normal. --Posterior cerebral arteries (PCA): Normal. ANTERIOR CIRCULATION: --Intracranial internal carotid arteries: Normal. --Anterior cerebral arteries (ACA): Normal. Both A1 segments are present. Patent anterior communicating artery (a-comm).  --Middle cerebral arteries (MCA): Normal. VENOUS SINUSES: As permitted by contrast timing, patent. ANATOMIC VARIANTS: Fetal origin of the left posterior cerebral artery. Review of the MIP images confirms the above findings. IMPRESSION: 1. Unchanged intraparenchymal hemorrhage of the right basal ganglia. 2. No aneurysm, dissection, occlusion or hemodynamically significant stenosis of the major cervical or intracranial arteries. Electronically Signed   By: Deatra Robinson M.D.   On: 11/25/2021 23:04    Cardiac Studies  Echocardiogram 11/23/21  1. Left ventricular ejection fraction, by estimation, is 30 to 35%. The  left ventricle has moderately decreased function. The left ventricle  demonstrates global hypokinesis. There  is severe left ventricular  hypertrophy. Left ventricular diastolic  parameters are consistent with Grade I diastolic dysfunction (impaired  relaxation).   2. Right ventricular systolic function is mildly reduced. The right  ventricular size is mildly enlarged. There is normal pulmonary artery  systolic pressure. The estimated right ventricular systolic pressure is  [id number] mmHg.   3. Left atrial size was moderately dilated.   4. The mitral valve is normal in structure. No evidence of mitral valve  regurgitation. No evidence of mitral stenosis.   5. The aortic valve is normal in structure. Aortic valve regurgitation is  not visualized. Aortic valve sclerosis is present, with no evidence of  aortic valve stenosis.   6. There is mild dilatation of the aortic root, measuring 42 mm.   7. The inferior vena cava is normal in size with greater than 50%  respiratory variability, suggesting right atrial pressure of 3 mmHg.   8. Challenging images   Patient Profile     51 y.o. male with a history of HFpEF, hypertension, CKD stage III, morbid obesity, CVA presenting with chest pain and headaches, who was admitted for hypertensive urgency, acute hemorrhagic infarct, who is being seen for  newly reduced EF and hypertension.  Assessment & Plan    Newly reduced EF 30-35% - presented with shortness of breath and abdominal swelling, chronic peripheral edema - transition back to torsemide 40 mg daily - continue coreg (heart rate precludes titration) and irbesartan - defer LHC at this time due to renal insufficiency and intracranial hemorrhage  - consider adding farxiga or jardiance - elevated serum creatinine preventing initiation of MRA therapy - repeat limited echo as outpatient and if continues with low EF pursue further ischemic work-up at that time  Hypertension - blood pressure 128/68 - continue coreg, hydralazine, irbesartan, and diuretic - avoid CCB and CCBd with long standing history of leg swelling - vital per unit protocol  Intracranial hemorrhage - continue to avoid antiplatelet therapy - blood pressure remains stable - Management per Neurology  Morbid obesity - recommend outpatient work-up for sleep apnea - lifestyle modifications with activity and diet  Chronic peripheral edema - 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   CKD stage IIIa - serum creatinine 1.58 - baseline serum creatinine 1.31 - daily bmp - in the setting of poorly controlled hypertension and poorly controlled diabetes     For questions or updates, please contact [place] Please consult [email or web address] for contact info under        Signed, Keyah Blizard, NP  11/26/2021, 9:45 AM

## 2021-11-26 NOTE — Discharge Instructions (Signed)
Check your sugars as per instruction keep log of it. Take your blood pressure medication regularly keep log of your BP to be reviewed by PCP.

## 2021-11-26 NOTE — Plan of Care (Addendum)
Neurology plan of care  Please see full neurology consult by Dr. Otelia Limes as well as plan of care entered by him on 11/24/2021.  This is a 51 year old gentleman presenting with acute hypertensive right basal ganglia ICH.  CTA of the head and neck showed stable intracranial hemorrhage without evidence of aneurysm, vascular malformation, or other etiology to explain.  Etiology is favored to be hypertensive emergency. BP control will be essential for this gentleman going forward. If he is not going to be discharged today, please start pharmacologic DVT prophylaxis. I will arrange outpatient neurology f/u with [facility] (was previously seen by Dr. Pearlean Brownie in 2019). Please discharge patient on ASA 81mg  daily for secondary stroke prevention (hx 2 prior ischemic strokes).  No further inpatient neurologic workup indicated. Neurology to sign off, but please re-engage if additional neurologic concerns arise.   , MD [facility] [phone]  If 7pm- 7am, please page neurology on call as listed in AMION.

## 2021-11-26 NOTE — TOC Transition Note (Addendum)
Transition of Care [facility]) - CM/SW Discharge Note   Patient Details  Name: Jeremiah Vance MRN: [id number] Date of Birth: 26-Oct-1970  [facility] CM/SW Contact:  Truddie Hidden, RN Phone Number: 11/26/2021, 1:23 PM   Clinical Narrative:    Spoke with patient regarding his discharge today. Patient's aunt will come to transport him home. Patient refused his recommended DME due to deductible. Advised his HH was arranged via [facility] and they would be in contact with him. TOC signing off.   Patient agreeable to $125 deductible. RW requested via [employer]. TOC signing off.      Barriers to Discharge: Continued Medical Work up   Patient Goals and CMS Choice Patient states their goals for this hospitalization and ongoing recovery are:: agrees to [facility] if the copy is not too high CMS Medicare.gov Compare Post Acute Care list provided to:: Patient Choice offered to / list presented to : Patient  Discharge Placement                       Discharge Plan and Services   Discharge Planning Services: CM Consult Post Acute Care Choice: Home Health          DME Arranged: [place] rolling, 3-N-1 DME Agency: [employer]       HH Arranged: PT, OT HH Agency: [facility] Date [facility] Agency Contacted: 11/24/21 Time HH Agency Contacted: 1616 Representative spoke with at [facility] Agency: weekend [employer]  Social Determinants of Health (SDOH) Interventions     Readmission Risk Interventions     No data to display

## 2021-11-28 ENCOUNTER — Ambulatory Visit
Admission: RE | Admit: 2021-11-28 | Discharge: 2021-11-28 | Disposition: A | Discharge status: Home / Self Care | Payer: BC Managed Care – PPO | Attending: Physician Assistant | Admitting: Physician Assistant

## 2021-11-28 ENCOUNTER — Ambulatory Visit: Payer: BC Managed Care – PPO | Admitting: Physician Assistant

## 2021-11-28 ENCOUNTER — Ambulatory Visit
Admission: RE | Admit: 2021-11-28 | Discharge: 2021-11-28 | Disposition: A | Discharge status: Home / Self Care | Payer: BC Managed Care – PPO | Source: Ambulatory Visit | Attending: Physician Assistant | Admitting: Physician Assistant

## 2021-11-28 ENCOUNTER — Encounter: Payer: Self-pay | Admitting: Physician Assistant

## 2021-11-28 VITALS — BP 163/137 | HR 78 | Temp 97.3°F | Resp 20

## 2021-11-28 DIAGNOSIS — I5021 Acute systolic (congestive) heart failure: Secondary | ICD-10-CM

## 2021-11-28 DIAGNOSIS — I10 Essential (primary) hypertension: Secondary | ICD-10-CM

## 2021-11-28 DIAGNOSIS — N183 Chronic kidney disease, stage 3 unspecified: Secondary | ICD-10-CM

## 2021-11-28 DIAGNOSIS — I5033 Acute on chronic diastolic (congestive) heart failure: Secondary | ICD-10-CM | POA: Insufficient documentation

## 2021-11-28 DIAGNOSIS — I639 Cerebral infarction, unspecified: Secondary | ICD-10-CM

## 2021-11-28 DIAGNOSIS — R778 Other specified abnormalities of plasma proteins: Secondary | ICD-10-CM

## 2021-11-28 DIAGNOSIS — E1165 Type 2 diabetes mellitus with hyperglycemia: Secondary | ICD-10-CM | POA: Diagnosis not present

## 2021-11-28 MED ORDER — AMLODIPINE BESYLATE 10 MG PO TABS: 10.0000 mg | ORAL_TABLET | Freq: Every day | ORAL | 0 refills | Status: DC

## 2021-11-28 NOTE — Progress Notes (Signed)
I,Sulibeya S Dimas,acting as a Neurosurgeon for [employer], PA-C.,have documented all relevant documentation on the behalf of Debera Lat, PA-C,as directed by  [employer], PA-C while in the presence of [employer], PA-C.    Established patient visit   Patient: Jeremiah Vance   DOB: 06/25/1970   [name]  MRN: [id number] Visit Date: 11/28/2021  Today's healthcare provider: Debera Lat, PA-C   Chief Complaint  Patient presents with   Hospitalization Follow-up   Subjective    HPI  Follow up Hospitalization  Patient was admitted to [facility] on 11/22/21 and discharged on 11/26/21. He was treated for acute CVA. Acute systolic CHF.  Treatment for this included stop Amlodipine and jardiance. Start  Telephone follow up was done on NA He reports excellent compliance with treatment. He reports this condition is improved.  ----------------------------------------------------------------------------------------- - Patient requesting insulin in pen due to poor eye sight. He is also needing assistance with insulin purchase due to price.  Patient requesting continuous  glucose monitor.  Patient reports using insulin at night. Patient wants to know if insulin should be used in the morning or in the evening.    Medications: Outpatient Medications Prior to Visit  Medication Sig   aspirin EC 81 MG tablet Take 1 tablet (81 mg total) by mouth daily. Swallow whole.   atorvastatin (LIPITOR) 40 MG tablet Take 1 tablet (40 mg total) by mouth daily at 6 PM.   carvedilol (COREG) 12.5 MG tablet Take 1 tablet (12.5 mg total) by mouth 2 (two) times daily with a meal.   glimepiride (AMARYL) 4 MG tablet Take 1 tablet (4 mg total) by mouth daily with breakfast.   hydrALAZINE (APRESOLINE) 50 MG tablet Take 1 tablet (50 mg total) by mouth 3 (three) times daily.   insulin glargine-yfgn (SEMGLEE) 100 UNIT/ML injection Inject 0.15 mLs (15 Units total) into the skin daily.   irbesartan (AVAPRO) 300 MG  tablet Take 1 tablet (300 mg total) by mouth daily.   linagliptin (TRADJENTA) 5 MG TABS tablet Take 1 tablet (5 mg total) by mouth daily.   torsemide (DEMADEX) 20 MG tablet Take 2 tablets (40 mg total) by mouth daily.   insulin degludec (TRESIBA FLEXTOUCH) 200 UNIT/ML FlexTouch Pen Inject 16 Units into the skin daily. (Patient not taking: Reported on 11/28/2021)   No facility-administered medications prior to visit.    Review of Systems  All other systems reviewed and are negative. Except see HPI     Objective    BP (!) 163/137 (BP Location: Left Arm, Patient Position: Sitting, Cuff Size: Large)   Pulse 78   Temp (!) 97.3 F (36.3 C) (Oral)   Resp 20   SpO2 96%  BP Readings from Last 3 Encounters:  11/28/21 (!) 163/137  11/26/21 121/80  06/21/20 (!) 190/100   Wt Readings from Last 3 Encounters:  11/24/21 (!) 369 lb 14.9 oz (167.8 kg)  06/21/20 (!) 378 lb 2 oz (171.5 kg)  06/09/20 (!) 380 lb 9.6 oz (172.6 kg)      Physical Exam Vitals reviewed.  Constitutional:      General: He is not in acute distress.    Appearance: Normal appearance. He is not diaphoretic.  HENT:     Head: Normocephalic and atraumatic.  Eyes:     General: No scleral icterus.    Conjunctiva/sclera: Conjunctivae normal.     Pupils: Pupils are equal, round, and reactive to light.  Cardiovascular:     Rate and Rhythm:  Normal rate and regular rhythm.     Pulses: Normal pulses.     Heart sounds: Normal heart sounds. No murmur heard. Pulmonary:     Effort: Pulmonary effort is normal.     Breath sounds: Rhonchi present.  Musculoskeletal:     Cervical back: Neck supple.     Right lower leg: No edema.     Left lower leg: No edema.  Lymphadenopathy:     Cervical: No cervical adenopathy.  Skin:    General: Skin is warm and dry.     Findings: No rash.  Neurological:     General: No focal deficit present.     Mental Status: He is alert and oriented to person, place, and time. Mental status is at  baseline.  Psychiatric:        Behavior: Behavior normal.        Thought Content: Thought content normal.        Judgment: Judgment normal.      No results found for any visits on 11/28/21.  Assessment & Plan     1. Type 2 diabetes mellitus with hyperglycemia, without long-term current use of insulin (HCC) Well controlled per home values Continue current medications, on insulin, amaryl, tradjenta - AMB Referral to [facility]  2. Essential hypertension Was diagnosed at ED with hypertensive emergency  Poorly controlled Change current regimen: increase Hydralazine and Carvedilol/see previous note Stop taking amlodipine . Continue taking irbesartan and torsemide 40mg  Recheck basic metabolic panel - Troponin T - AMB [facility] to [facility] - Pt was strongly advised to set up an appt with Cardiology and Nephrology for the beginning of this week  3. Cerebrovascular accident (CVA), unspecified mechanism (HCC) Was diagnosed at recent ED visit with acute CVA hemorrhagic right basal ganglia due to uncontrolled blood pressure CT head and MRI brain confirmed - AMB Referral to [facility]  4. Stage 3 chronic kidney disease, unspecified whether stage 3a or 3b CKD (HCC) Recommended to drink 8-12 glasses of water every day, avoid NSAIDs, and have renal functions check avery 6-12 months to ensure stability.     - Basic Metabolic Panel (BMET) - AMB Referral to [facility]ordin[facility]  5. Morbid obesity (HCC) Needs sleep studies Weight loss of 5% of pt's current weight via healthy diet and daily exercise encouraged.  - AMB Referral to [facility]  6. Acute systolic congestive heart failure(HCC) Abnormal lung exam - DG Chest 2 View; Future - Pro b natriuretic peptide (BNP)[facility]) - Troponin T - AMB Referral to [facility] Coordination  7. Elevated troponin level not due to acute coronary  syndrome - Troponin T - AMB Referral to [facility]ordin[facility]   No follow-ups on file.     The patient was advised to call back or seek an in-person evaluation if the symptoms worsen or if the condition fails to improve as anticipated.  I discussed the assessment and treatment plan with the patient and with Dr. [facility]. The patient was provided an opportunity to ask questions and all were answered. The patient agreed with the plan and demonstrated an understanding of the instructions.  The entirety of the information documented in the History of Present Illness, Review of Systems and Physical Exam were personally obtained by me. Portions of this information were initially documented by the CMA and reviewed by me for thoroughness and accuracy.  Portions of this note were created using dictation software and may contain typographical errors.  Total encounter time more than 30 minutes  Greater than 50% was spent in counseling and coordination of care with the patient    Elberta Leatherwood  [facility] [phone] (phone) [phone] (fax)  [place]

## 2021-11-29 ENCOUNTER — Emergency Department
Admission: EM | Admit: 2021-11-29 | Discharge: 2021-11-30 | Disposition: A | Discharge status: Home / Self Care | Payer: BC Managed Care – PPO | Attending: Emergency Medicine | Admitting: Emergency Medicine

## 2021-11-29 ENCOUNTER — Emergency Department: Payer: BC Managed Care – PPO

## 2021-11-29 ENCOUNTER — Other Ambulatory Visit: Payer: Self-pay | Admitting: Physician Assistant

## 2021-11-29 ENCOUNTER — Other Ambulatory Visit: Payer: Self-pay

## 2021-11-29 ENCOUNTER — Telehealth: Payer: Self-pay | Admitting: Physician Assistant

## 2021-11-29 DIAGNOSIS — M545 Low back pain, unspecified: Secondary | ICD-10-CM | POA: Insufficient documentation

## 2021-11-29 DIAGNOSIS — R109 Unspecified abdominal pain: Secondary | ICD-10-CM | POA: Insufficient documentation

## 2021-11-29 DIAGNOSIS — R079 Chest pain, unspecified: Secondary | ICD-10-CM | POA: Insufficient documentation

## 2021-11-29 DIAGNOSIS — I10 Essential (primary) hypertension: Secondary | ICD-10-CM

## 2021-11-29 DIAGNOSIS — R791 Abnormal coagulation profile: Secondary | ICD-10-CM | POA: Insufficient documentation

## 2021-11-29 LAB — CBC WITH DIFFERENTIAL/PLATELET
Abs Immature Granulocytes: 0.04 10*3/uL (ref 0.00–0.07)
Basophils Absolute: 0.1 10*3/uL (ref 0.0–0.1)
Basophils Relative: 1 %
Eosinophils Absolute: 0.3 10*3/uL (ref 0.0–0.5)
Eosinophils Relative: 3 %
HCT: 39.8 % (ref 39.0–52.0)
Hemoglobin: 12.3 g/dL — ABNORMAL LOW (ref 13.0–17.0)
Immature Granulocytes: 0 %
Lymphocytes Relative: 20 %
Lymphs Abs: 2.1 10*3/uL (ref 0.7–4.0)
MCH: 27.5 pg (ref 26.0–34.0)
MCHC: 30.9 g/dL (ref 30.0–36.0)
MCV: 89 fL (ref 80.0–100.0)
Monocytes Absolute: 1.1 10*3/uL — ABNORMAL HIGH (ref 0.1–1.0)
Monocytes Relative: 11 %
Neutro Abs: 6.8 10*3/uL (ref 1.7–7.7)
Neutrophils Relative %: 65 %
Platelets: 367 10*3/uL (ref 150–400)
RBC: 4.47 MIL/uL (ref 4.22–5.81)
RDW: 12.9 % (ref 11.5–15.5)
WBC: 10.4 10*3/uL (ref 4.0–10.5)
nRBC: 0 % (ref 0.0–0.2)

## 2021-11-29 LAB — BASIC METABOLIC PANEL
Anion gap: 6 (ref 5–15)
BUN/Creatinine Ratio: 18 (ref 9–20)
BUN: 36 mg/dL — ABNORMAL HIGH (ref 6–24)
BUN: 71 mg/dL — ABNORMAL HIGH (ref 6–20)
CO2: 25 mmol/L (ref 20–29)
CO2: 28 mmol/L (ref 22–32)
Calcium: 9.8 mg/dL (ref 8.7–10.2)
Calcium: 8.5 mg/dL — ABNORMAL LOW (ref 8.9–10.3)
Chloride: 95 mmol/L — ABNORMAL LOW (ref 96–106)
Chloride: 103 mmol/L (ref 98–111)
Creatinine, Ser: 1.98 mg/dL — ABNORMAL HIGH (ref 0.76–1.27)
Creatinine, Ser: 1.87 mg/dL — ABNORMAL HIGH (ref 0.61–1.24)
GFR, Estimated: 43 mL/min — ABNORMAL LOW (ref >60)
Glucose, Bld: 98 mg/dL (ref 70–99)
Glucose: 158 mg/dL — ABNORMAL HIGH (ref 70–99)
Potassium: 5.4 mmol/L — ABNORMAL HIGH (ref 3.5–5.2)
Potassium: 4.3 mmol/L (ref 3.5–5.1)
Sodium: 137 mmol/L (ref 134–144)
Sodium: 137 mmol/L (ref 135–145)
eGFR: 40 mL/min/{1.73_m2} — ABNORMAL LOW (ref >59)

## 2021-11-29 LAB — COMPREHENSIVE METABOLIC PANEL
ALT: 21 U/L (ref 0–44)
AST: 19 U/L (ref 15–41)
Albumin: 3 g/dL — ABNORMAL LOW (ref 3.5–5.0)
Alkaline Phosphatase: 65 U/L (ref 38–126)
Anion gap: 10 (ref 5–15)
BUN: 65 mg/dL — ABNORMAL HIGH (ref 6–20)
CO2: 24 mmol/L (ref 22–32)
Calcium: 8.6 mg/dL — ABNORMAL LOW (ref 8.9–10.3)
Chloride: 104 mmol/L (ref 98–111)
Creatinine, Ser: 1.81 mg/dL — ABNORMAL HIGH (ref 0.61–1.24)
GFR, Estimated: 45 mL/min — ABNORMAL LOW (ref >60)
Glucose, Bld: 140 mg/dL — ABNORMAL HIGH (ref 70–99)
Potassium: 4.6 mmol/L (ref 3.5–5.1)
Sodium: 138 mmol/L (ref 135–145)
Total Bilirubin: 1.2 mg/dL (ref 0.3–1.2)
Total Protein: 7.2 g/dL (ref 6.5–8.1)

## 2021-11-29 LAB — TROPONIN I (HIGH SENSITIVITY)
Troponin I (High Sensitivity): 43 ng/L — ABNORMAL HIGH (ref <18)
Troponin I (High Sensitivity): 44 ng/L — ABNORMAL HIGH (ref <18)

## 2021-11-29 LAB — PRO B NATRIURETIC PEPTIDE: NT-Pro BNP: 966 pg/mL — ABNORMAL HIGH (ref 0–121)

## 2021-11-29 LAB — LACTIC ACID, PLASMA: Lactic Acid, Venous: 0.7 mmol/L (ref 0.5–1.9)

## 2021-11-29 LAB — D-DIMER, QUANTITATIVE: D-Dimer, Quant: 1.27 ug/mL-FEU — ABNORMAL HIGH (ref 0.00–0.50)

## 2021-11-29 LAB — TROPONIN T: Troponin T (Highly Sensitive): 67 ng/L (ref 0–22)

## 2021-11-29 LAB — BRAIN NATRIURETIC PEPTIDE: B Natriuretic Peptide: 66 pg/mL (ref 0.0–100.0)

## 2021-11-29 MED ORDER — IOHEXOL 350 MG/ML SOLN
100.0000 mL | Freq: Once | INTRAVENOUS | Status: AC | PRN
  Administered 2021-11-29: 100 mL via INTRAVENOUS

## 2021-11-29 MED ORDER — HYDRALAZINE HCL 100 MG PO TABS: 100.0000 mg | ORAL_TABLET | Freq: Two times a day (BID) | ORAL | 3 refills | Status: DC

## 2021-11-29 MED ORDER — SODIUM CHLORIDE 0.9 % IV BOLUS
1000.0000 mL | Freq: Once | INTRAVENOUS | Status: AC
  Administered 2021-11-29: 1000 mL via INTRAVENOUS

## 2021-11-29 NOTE — Progress Notes (Signed)
Your troponin level elevated , your BNP level elevated.  You need emergent assessment. You might having an exacerbation of Heart failure and /or Heart attack. You need to go to ER!

## 2021-11-29 NOTE — Discharge Instructions (Addendum)
Please return for any further symptoms.  Here D-dimer was elevated with a chest CT did not show any sign of blood clots or anything else like a pneumonia going on in your chest.  Your troponins have been stable at 43 and 44.  Please make sure you are drinking enough fluids.  This will help maintain your kidney function.

## 2021-11-29 NOTE — Telephone Encounter (Signed)
Spoke with pt via phone and urged him to go to ED as his BNP and Troponin levels are high.

## 2021-11-29 NOTE — ED Provider Notes (Signed)
[facility] Provider Note    Event Date/Time   First MD Initiated Contact with Patient 11/29/21 1837     (approximate)   History   Chest Pain (Cp and sob )   HPI  Jeremiah Vance is a 51 y.o. male who comes in complaining of pain in his chest.  It is in the middle of his chest that sharp and stabbing is worse when he breathes or moves.  It is also worse when he exerts himself.  He got much worse when he was moving himself from his ambulance stretcher to the hospital stretcher.  Review of his old records shows that his BNP and troponin were elevated yesterday.      Physical Exam   Triage Vital Signs: ED Triage Vitals  Enc Vitals Group     BP 11/29/21 1839 117/75     Pulse Rate 11/29/21 1833 77     Resp 11/29/21 1833 17     Temp 11/29/21 1833 98.7 F (37.1 C)     Temp Source 11/29/21 1833 Oral     SpO2 11/29/21 1833 99 %     Weight 11/29/21 1838 (!) 330 lb 11 oz (150 kg)     Height 11/29/21 1838 5\' 9"  (1.753 m)     Head Circumference --      Peak Flow --      Pain Score 11/29/21 1838 6     Pain Loc --      Pain Edu? --      Excl. in GC? --     Most recent vital signs: Vitals:   11/29/21 2330 11/30/21 0100  BP: 125/72 (!) 144/74  Pulse: 72 71  Resp: 19 14  Temp:    SpO2: 93% 95%     General: Awake, no distress.  CV:  Good peripheral perfusion.  Heart regular rate and rhythm no audible murmurs Resp:  Normal effort.  Lungs sound clear Abd:  No distention.  There is some tenderness in his abdomen where he says his hernia is otherwise there is no abdominal tenderness.  There is some low back pain very little on the right slightly    ED Results / Procedures / Treatments   Labs (all labs ordered are listed, but only abnormal results are displayed) Labs Reviewed  COMPREHENSIVE METABOLIC PANEL - Abnormal; Notable for the following components:      Result Value   Glucose, Bld 140 (*)    BUN 65 (*)    Creatinine, Ser 1.81 (*)     Calcium 8.6 (*)    Albumin 3.0 (*)    GFR, Estimated 45 (*)    All other components within normal limits  CBC WITH DIFFERENTIAL/PLATELET - Abnormal; Notable for the following components:   Hemoglobin 12.3 (*)    Monocytes Absolute 1.1 (*)    All other components within normal limits  D-DIMER, QUANTITATIVE - Abnormal; Notable for the following components:   D-Dimer, Quant 1.27 (*)    All other components within normal limits  BASIC METABOLIC PANEL - Abnormal; Notable for the following components:   BUN 71 (*)    Creatinine, Ser 1.87 (*)    Calcium 8.5 (*)    GFR, Estimated 43 (*)    All other components within normal limits  TROPONIN I (HIGH SENSITIVITY) - Abnormal; Notable for the following components:   Troponin I (High Sensitivity) 43 (*)    All other components within normal limits  TROPONIN I (HIGH SENSITIVITY) -  Abnormal; Notable for the following components:   Troponin I (High Sensitivity) 44 (*)    All other components within normal limits  LACTIC ACID, PLASMA  BRAIN NATRIURETIC PEPTIDE  LACTIC ACID, PLASMA     EKG  EKG read interpreted by me shows normal sinus rhythm rate of 77 normal axis there is flipped T's in 1 and L which were present on EKG 6 days ago.  No other acute ST-T changes.   RADIOLOGY CT angio of the chest read by radiology reviewed and interpreted by me does not show any acute problems.  Possible early liver cirrhosis though. Chest x-ray was read by radiology reviewed and interpreted by me does not show any acute problems either. PROCEDURES:  Critical Care performed:   Procedures   MEDICATIONS ORDERED IN ED: Medications  iohexol (OMNIPAQUE) 350 MG/ML injection 100 mL (100 mLs Intravenous Contrast Given 11/29/21 1959)  sodium chloride 0.9 % bolus 1,000 mL (1,000 mLs Intravenous New Bag/Given 11/29/21 2342)     IMPRESSION / MDM / ASSESSMENT AND PLAN / ED COURSE  I reviewed the triage vital signs and the nursing notes. Patient's D-dimer was  elevated but CT is negative.  Patient's troponins are stable.  Patient was offered admission to work on his worsened GFR and just to watch him overnight and see how he did but he does not want to do that he wants to go home.  He is aware that he will need to drink a lot of fluids to make sure the GFR gets better.  He should follow-up with his doctor to ensure that to make sure that everything else is doing okay as well.    Patient's presentation is most consistent with acute complicated illness / injury requiring diagnostic workup.  The patient is on the cardiac monitor to evaluate for evidence of arrhythmia and/or significant heart rate changes.  None were seen   FINAL CLINICAL IMPRESSION(S) / ED DIAGNOSES   Final diagnoses:  Nonspecific chest pain   Also worsened GFR not responsive to IV fluids 1 L.  Rx / DC Orders   ED Discharge Orders          Ordered    Ambulatory referral to Cardiology       Comments: If you have not heard from the Cardiology office within the next 72 hours please call [phone].   Pending             Note:  This document was prepared using Dragon voice recognition software and may include unintentional dictation errors.   Arnaldo Natal, MD 11/30/21 [phone]

## 2021-11-29 NOTE — Progress Notes (Signed)
Hello Jeremiah Vance ,   Your CXR results all are stable. As soon as lab results will be back I will let you know  Any questions please reach out to the office or message me on MyChart!  Best, Debera Lat, PA-C

## 2021-11-29 NOTE — Progress Notes (Signed)
Hello Jeremiah Vance ,   Some of your labwork results are back.  Please, readjust your medication regimen to the following: Continue to take torsemide 40 mg Irbesartan 300 mg Increase carvedilol to 12.5 mg  three times daily Increase Hydralazine to 100 mg BID, Rx for this medication will be sent to your pharmacy. Please, let me know if you are out of any of your medications You might temporarily discontinue Amlodipine   You would hear soon from our clinic pharmacist. I placed a referral  No changes need to be made to medications, and no further tests need to be ordered.  Any questions please reach out to the office or message me on MyChart!  Best, Debera Lat, PA-C

## 2021-11-29 NOTE — ED Notes (Signed)
Patient transported to CT 

## 2021-11-29 NOTE — ED Triage Notes (Signed)
Cp and sob x 1 hour, recently seen for TIA here last Thursday

## 2021-11-30 LAB — LACTIC ACID, PLASMA: Lactic Acid, Venous: 0.5 mmol/L (ref 0.5–1.9)

## 2021-11-30 NOTE — Hospital Course (Signed)
Jeremiah Vance is an 51 y.o. male with a PMHx of 2 prior strokes, CKD, anemia, arthritis, CHF, Chicken pox, hypercholesterolemia, cardiomyopathy, hyperbilirubinemia, HTN, morbid obesity and DM2 who presented to the ED this morning with left sided CP accompanied by frontal HA and "numbness" to fingers of left hand since last night   CT head revealed a small right basal ganglia ICH with a small rim of adjacent vasogenic edema. The appearance is most consistent with a hypertensive hemorrhage.    MRI Brain Stable appearance of right putamen/external capsule hematoma compared to recent CT head with mild surrounding vasogenic edema without significant mass effect. 2. Focus of restricted diffusion is seen within the right corona radiata right above the hematoma, corresponding to acute infarct. 3. FLAIR hyperintensity is noted on the left side of the right globe and less evident posteriorly in the left globe may represent small retinal detachments. Correlation with fundoscopic exam suggested. 4. Mild-to-moderate chronic white matter disease.   Acute CVA, hemorrhagic right basal ganglia with right external capsule/putamen hematoma hypertensive emergency medication noncompliance recent fall -- patient currently on IV Nicardipine drip--now weaned off -- On  irbesartan  Coreg, hydralazine -- seen by Drs. Lindzen/Stack neurology --Urine drug screen positive for tricyclic antidepressant -- follow-up PT OT--HH recomemnded -- appreciate speech therapy input --cont present BP meds --OK to start ASA 81 mg qd for secondary prevention--d/w Dr Selina Cooley   Acute systolic congestive heart failure -- seen by [facility] cardiology Dr. Mariah Milling. -- Not seem to be in heart failure -- received one dose of Lasix--Cards recommends IV lasix 40 mg bid--good diuresis--->po torsemide 40 mg qd --weaned to RA --EF 30-35% with Grade I diastolic dysfunction -- okay from cardiology standpoint to discharge. Discussed with Dr.  Kirke Corin   Diabetes type II, uncontrolled due to medication noncompliance, chronic kidney disease stage IIIa -- seen by diabetes coordinator -- will consider doing Lantus, Amaryl, tradjenta due to patient affordability and insurance coverage --A 1C 10.6 --sliding scale insulin --avoiding metformin due to creat   Chronic kidney disease stage IIIa with DM-2 -- creatinine stable -- avoid nephrotoxic agents --Replace electrolytes as indicated   Morbid obesity suspected sleep apnea -- patient advised lifestyle and dietary changes -- sleep study as outpatient

## 2021-12-04 ENCOUNTER — Encounter: Payer: Self-pay | Admitting: Physician Assistant

## 2021-12-04 ENCOUNTER — Ambulatory Visit: Payer: BC Managed Care – PPO | Admitting: Physician Assistant

## 2021-12-04 VITALS — BP 93/57 | HR 72 | Temp 97.6°F | Resp 17 | Ht 70.0 in | Wt 340.0 lb

## 2021-12-04 DIAGNOSIS — G4730 Sleep apnea, unspecified: Secondary | ICD-10-CM

## 2021-12-04 DIAGNOSIS — R778 Other specified abnormalities of plasma proteins: Secondary | ICD-10-CM

## 2021-12-04 DIAGNOSIS — I10 Essential (primary) hypertension: Secondary | ICD-10-CM

## 2021-12-04 DIAGNOSIS — N183 Chronic kidney disease, stage 3 unspecified: Secondary | ICD-10-CM

## 2021-12-04 DIAGNOSIS — I5021 Acute systolic (congestive) heart failure: Secondary | ICD-10-CM

## 2021-12-04 DIAGNOSIS — E1165 Type 2 diabetes mellitus with hyperglycemia: Secondary | ICD-10-CM

## 2021-12-04 DIAGNOSIS — I639 Cerebral infarction, unspecified: Secondary | ICD-10-CM | POA: Diagnosis not present

## 2021-12-04 DIAGNOSIS — Z09 Encounter for follow-up examination after completed treatment for conditions other than malignant neoplasm: Secondary | ICD-10-CM

## 2021-12-04 DIAGNOSIS — R7989 Other specified abnormal findings of blood chemistry: Secondary | ICD-10-CM

## 2021-12-04 DIAGNOSIS — R0683 Snoring: Secondary | ICD-10-CM

## 2021-12-04 MED ORDER — TRESIBA FLEXTOUCH 200 UNIT/ML ~~LOC~~ SOPN: 15.0000 [IU] | PEN_INJECTOR | Freq: Every day | SUBCUTANEOUS | 3 refills | Status: DC

## 2021-12-04 NOTE — Progress Notes (Signed)
I,Tiffany J Bragg,acting as a Education administrator for [employer], PA-C.,have documented all relevant documentation on the behalf of [name], PA-C,as directed by  [employer], PA-C while in the presence of [employer], PA-C.  Established patient visit   Patient: Jeremiah Vance   DOB: [birth year]   [name]  MRN: [id number] Visit Date: 12/04/2021  Today's healthcare provider: [name], PA-C   Chief Complaint  Patient presents with   Hypertension   Subjective    HPI  Hypertension, follow-up  BP Readings from Last 3 Encounters:  12/04/21 (!) 93/57  11/30/21 (!) 144/74  11/28/21 (!) 163/137   Wt Readings from Last 3 Encounters:  12/04/21 (!) 340 lb (154.2 kg)  11/29/21 (!) 330 lb 11 oz (150 kg)  11/24/21 (!) 369 lb 14.9 oz (167.8 kg)     He was last seen for hypertension 1 weeks ago.  BP at that visit was 163/137. Management since that visit includes increased hydralazine.  He reports excellent compliance with treatment. He is not having side effects.  He is following a Low fat diet. He is not exercising. He does not smoke.  Use of agents associated with hypertension: none.   Outside blood pressures are not checked. Symptoms: No chest pain No chest pressure  No palpitations No syncope  Yes dyspnea No orthopnea  No paroxysmal nocturnal dyspnea Yes lower extremity edema   Pertinent labs Lab Results  Component Value Date   CHOL 162 11/22/2021   HDL 30 (L) 11/22/2021   LDLCALC 105 (H) 11/22/2021   TRIG 135 11/22/2021   CHOLHDL 5.4 11/22/2021   Lab Results  Component Value Date   NA 137 11/29/2021   K 4.3 11/29/2021   CREATININE 1.87 (H) 11/29/2021   GFRNONAA 43 (L) 11/29/2021   GLUCOSE 98 11/29/2021   TSH 0.842 11/22/2021     The ASCVD Risk score (Arnett DK, et al., 2019) failed to calculate for the following reasons:   The patient has a prior MI or stroke  diagnosis  ---------------------------------------------------------------------------------------------------  Was seen at ED on 11/30/21 : his BNP and troponin levels were elevated And he complaints on chest pain, his BP was 163/137. EKG showed normal sinus rhythm with rate of 77. CXR showed no acute problems however, pt might have early liver cirrhosis Pt states that he is Doing well   Medications: Outpatient Medications Prior to Visit  Medication Sig   aspirin EC 81 MG tablet Take 1 tablet (81 mg total) by mouth daily. Swallow whole.   atorvastatin (LIPITOR) 40 MG tablet Take 1 tablet (40 mg total) by mouth daily at 6 PM.   carvedilol (COREG) 12.5 MG tablet Take 1 tablet (12.5 mg total) by mouth 2 (two) times daily with a meal.   glimepiride (AMARYL) 4 MG tablet Take 1 tablet (4 mg total) by mouth daily with breakfast.   hydrALAZINE (APRESOLINE) 100 MG tablet Take 1 tablet (100 mg total) by mouth 2 (two) times daily.   insulin glargine-yfgn (SEMGLEE) 100 UNIT/ML injection Inject 0.15 mLs (15 Units total) into the skin daily.   irbesartan (AVAPRO) 300 MG tablet Take 1 tablet (300 mg total) by mouth daily.   linagliptin (TRADJENTA) 5 MG TABS tablet Take 1 tablet (5 mg total) by mouth daily.   torsemide (DEMADEX) 20 MG tablet Take 2 tablets (40 mg total) by mouth daily.   insulin degludec (TRESIBA FLEXTOUCH) 200 UNIT/ML FlexTouch Pen Inject 16 Units into the skin daily. (Patient not taking:  Reported on 12/04/2021)   No facility-administered medications prior to visit.    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 Except see HPI     Objective    BP (!) 93/57 (BP Location: Right Arm, Patient Position: Sitting, Cuff Size: Large)   Pulse 72   Temp 97.6 F (36.4 C) (Oral)   Resp 17   Ht 5\' 10"  (1.778 m)   Wt (!) 340 lb (154.2 kg)   SpO2 99%   BMI 48.78 kg/m     Physical Exam Vitals reviewed.  Constitutional:      General: He is not in acute distress.    Appearance: Normal appearance. He is not diaphoretic.  HENT:     Head: Normocephalic and atraumatic.  Eyes:     General: No scleral icterus.    Extraocular Movements: Extraocular movements intact.     Conjunctiva/sclera: Conjunctivae normal.     Pupils: Pupils are equal, round, and reactive to light.  Cardiovascular:     Rate and Rhythm: Normal rate and regular rhythm.     Pulses: Normal pulses.     Heart sounds: Normal heart sounds. No murmur heard. Pulmonary:     Effort: Pulmonary effort is normal. No respiratory distress.     Breath sounds: Normal breath sounds. No wheezing or rhonchi.  Abdominal:     General: Abdomen is flat. Bowel sounds are normal.     Palpations: Abdomen is soft.  Musculoskeletal:     Cervical back: Neck supple.     Right lower leg: No edema.     Left lower leg: No edema.  Lymphadenopathy:     Cervical: No cervical adenopathy.  Skin:    General: Skin is warm and dry.     Findings: No rash.  Neurological:     Mental Status: He is alert and oriented to person, place, and time. Mental status is at baseline.  Psychiatric:        Mood and Affect: Mood normal.        Behavior: Behavior normal.        Thought Content: Thought content normal.        Judgment: Judgment normal.      No results found for any visits on 12/04/21.  Assessment & Plan     1. Stage 3 chronic kidney disease, unspecified whether stage 3a or 3b CKD (HCC)  2. Essential hypertension On 4 medications, hydralazine frequency/dose was decreased to 50mg  TID /pt still has this medication/by Dr. 02/03/22 on 11/29/21 Will reassess at his next appt Bp today was 93/57 but pt reassured me that he feels  fine  3. Type 2 diabetes mellitus with hyperglycemia, withouPtm current use of insulin (HCC) Unclear if it will be covered by an insurance vs glardine in injextion - insulin degludec (TRESIBA  FLEXTOUCH) 200 UNIT/ML FlexTouch Pen; Inject 16 Units into the skin daily.  Dispense: 3 mL; Refill: 3  4. Cerebrovascular accident (CVA), unspecified mechanism (HCC) Needs to schedule a FU with Neurology  5. Acute systolic (congestive) heart failure (HCC) Normal PE Needs to schedule a FU with Cardiology  6. Morbid obesity (HCC)  Weight loss of 5% of pt's current weight via healthy diet and daily exercise encouraged.   7. Elevated troponin level not due to acute coronary syndrome Was seen by ED recently.  Results were reassuring  8. Elevated  LFTs Weight loss of 5% of pt's current weight via healthy diet and daily exercise encouraged.  Might need to be referred to GI  9. Sleep apnea, unspecified type Obstructive sleep apnea             Narrow oropharyngeal space, loud snoring, frequent awakening, morning headaches,         - Ambulatory referral to Sleep Studies  10. ED/hospital FU See above Fu in a mo    The patient was advised to call back or seek an in-person evaluation if the symptoms worsen or if the condition fails to improve as anticipated.  I discussed the assessment and treatment plan with the patient. The patient was provided an opportunity to ask questions and all were answered. The patient agreed with the plan and demonstrated an understanding of the instructions.  The entirety of the information documented in the History of Present Illness, Review of Systems and Physical Exam were personally obtained by me. Portions of this information were initially documented by the CMA and reviewed by me for thoroughness and accuracy.  Portions of this note were created using dictation software and may contain typographical errors.        Total encounter time more than 40 minutes  Greater than 50% was spent in counseling and coordination of care with the patient   Cherlynn Polo  [place] [phone] (phone) [phone] (fax)  [facility]

## 2021-12-04 NOTE — Patient Instructions (Signed)
Please, schedule an appt with Cardiology and Neurology You will receive a call to schedule Sleep Studies You will need to change your BP regimen, see the note You could try G7 and measure your blood sugar daily You will be contacted by Angelena Sole , a pharmacist Evaristo Bury Rx was sent to your pharmacy

## 2021-12-16 NOTE — Progress Notes (Unsigned)
Cardiology Office Note:    Date:  12/17/2021   ID:  Jeremiah Vance, DOB 05/07/1970, MRN [id number]  PCP:  Jeremiah Speak, PA-C  [facility] Cardiologist:  Jeremiah Sable, MD  [facility] Electrophysiologist:  None   Referring MD: Jeremiah Polio, MD   Chief Complaint: 6 month follow-up  History of Present Illness:    Jeremiah Vance is a 51 y.o. male with a hx of medication noncompliance, HTN, morbid obesity, chronic diastolic CHF, poorly controlled diabetes Type 2, stroke x2, OSA noncompliant with CPAP, chronic lower extremity edema and CKD who presents for 6 month follow-up.  Admitted in 07/2019 with CHF. Echo showed LVEF 50-55%. He was diuresed with IV lasix.   Admitted more recently in August 2023 for Headache dizziness, speech. CT head showed small acute hemorrhage. BP in the ER was high and he was started on nicardipine infusion with improvement of BP. He was transitioned to irbesartan, Coreg and Hydralazine. UDS positive for tricyclic antidepressant. ASA was started at d/c. Echo showed LVEF 30-35%, G1DD, He was diuresed and sent home on torsemide 40mg  daily.   He went to the ED 11/29/21 for chest pain. Vitals were normal. HS troponin was minimally elevated to 44. Ddimer was elevated, CT chest was negative. HE was offered admission, but declined.   Today, BP is low. The patient reports it has been high in the past. He is taking Hydralazine 50mg  TID. He was unable to go to work due to dizziness yesterday. The patient denies dizziness today.  He denies chest pain. He reports stable dyspnea on exertion. He has unchanged lower leg edema. He takes torsemide 40mg  daily. He is eating low salt and keeping his feet up at home.   Past Medical History:  Diagnosis Date   Acute ischemic stroke ([place]) 09/06/2017   Acute renal failure superimposed on stage 3a chronic kidney disease ([place]) 07/08/2019   Acute right-sided weakness    Allergies    Anasarca    Anemia 07/10/2017   Arthritis     CHF (congestive heart failure) ([place])    "coinsided w/kidney problems I was having 06/2017"   Chicken pox    CKD (chronic kidney disease) stage 3, GFR 30-59 ml/min (HCC) 06/2017   Depression    Elevated troponin 07/08/2019   High cholesterol    History of cardiomyopathy    LVEF 40 to 45% in April 2019 - subsequently normalized   Hyperbilirubinemia 07/10/2017   Hypertension    Ischemic stroke ([place])    Small left internal capsule infarct due to lacunar disease   Morbid obesity ([place])    Normocytic anemia 12/16/2017   Recurrent incisional hernia with incarceration s/p repair 10/22/2017 10/21/2017   Stroke ([place]) 08/2017   "right sided weakness since; getting stronger though" (10/22/2017)   Type 2 diabetes mellitus ([place])     Past Surgical History:  Procedure Laterality Date   ABDOMINAL HERNIA REPAIR  2008; 10/22/2017   "scope; OPEN REPAIR INCARCERATED VENTRAL HERNIA   HERNIA REPAIR     KNEE ARTHROSCOPY Right 1989   VENTRAL HERNIA REPAIR N/A 10/22/2017   Procedure: OPEN REPAIR INCARCERATED VENTRAL HERNIA;  Surgeon: Excell Seltzer, MD;  Location: [facility];  Service: General;  Laterality: N/A;    Current Medications: Current Meds  Medication Sig   aspirin EC 81 MG tablet Take 1 tablet (81 mg total) by mouth daily. Swallow whole.   atorvastatin (LIPITOR) 40 MG tablet Take 1 tablet (40 mg total) by mouth daily at 6 PM.  carvedilol (COREG) 12.5 MG tablet Take 1 tablet (12.5 mg total) by mouth 2 (two) times daily with a meal.   glimepiride (AMARYL) 4 MG tablet Take 1 tablet (4 mg total) by mouth daily with breakfast.   insulin degludec (TRESIBA FLEXTOUCH) 200 UNIT/ML FlexTouch Pen Inject 16 Units into the skin daily.   irbesartan (AVAPRO) 300 MG tablet Take 1 tablet (300 mg total) by mouth daily.   linagliptin (TRADJENTA) 5 MG TABS tablet Take 1 tablet (5 mg total) by mouth daily.   torsemide (DEMADEX) 20 MG tablet Take 2 tablets (40 mg total) by mouth daily.   [DISCONTINUED] hydrALAZINE  (APRESOLINE) 100 MG tablet Take 1 tablet (100 mg total) by mouth 2 (two) times daily.     Allergies:   Sulfa antibiotics and Pollen extract   Social History   Socioeconomic History   Marital status: Divorced    Spouse name: Not on file   Number of children: Not on file   Years of education: Not on file   Highest education level: Not on file  Occupational History   Not on file  Tobacco Use   Smoking status: Never   Smokeless tobacco: Never  Vaping Use   Vaping Use: Never used  Substance and Sexual Activity   Alcohol use: Yes    Comment: occasional beer   Drug use: Never   Sexual activity: Not Currently  Other Topics Concern   Not on file  Social History Narrative   He lives with his sister , Jeremiah Vance and she is his MPOA after his stokes   Social Determinants of Health   Financial Resource Strain: High Risk (03/20/2020)   Overall Financial Resource Strain (CARDIA)    Difficulty of Paying Living Expenses: Hard  Food Insecurity: Not on file  Transportation Needs: Unmet Transportation Needs (03/09/2020)   PRAPARE - Administrator, Civil Service (Medical): Yes    Lack of Transportation (Non-Medical): Yes  Physical Activity: Not on file  Stress: Not on file  Social Connections: Not on file     Family History: The patient's family history includes Arthritis in his father, mother, paternal grandfather, and paternal grandmother; Depression in his maternal grandmother, mother, and sister; Diabetes in his father, maternal grandmother, mother, sister, and sister; Hearing loss in his father and paternal grandmother; Heart attack in his father and paternal grandfather; Heart disease in his father, maternal grandmother, mother, and paternal grandfather; Hyperlipidemia in his father, maternal grandfather, and maternal grandmother; Hypertension in his father, maternal grandfather, maternal grandmother, mother, and sister; Learning disabilities in his mother; Mental illness in  his mother and sister; Stroke in his father, maternal grandfather, maternal grandmother, mother, and paternal grandfather.  ROS:   Please see the history of present illness.     All other systems reviewed and are negative.  EKGs/Labs/Other Studies Reviewed:    The following studies were reviewed today:  Echo 10/2021  1. Left ventricular ejection fraction, by estimation, is 30 to 35%. The  left ventricle has moderately decreased function. The left ventricle  demonstrates global hypokinesis. There is severe left ventricular  hypertrophy. Left ventricular diastolic  parameters are consistent with Grade I diastolic dysfunction (impaired  relaxation).   2. Right ventricular systolic function is mildly reduced. The right  ventricular size is mildly enlarged. There is normal pulmonary artery  systolic pressure. The estimated right ventricular systolic pressure is  22.7 mmHg.   3. Left atrial size was moderately dilated.   4. The mitral valve is  normal in structure. No evidence of mitral valve  regurgitation. No evidence of mitral stenosis.   5. The aortic valve is normal in structure. Aortic valve regurgitation is  not visualized. Aortic valve sclerosis is present, with no evidence of  aortic valve stenosis.   6. There is mild dilatation of the aortic root, measuring 42 mm.   7. The inferior vena cava is normal in size with greater than 50%  respiratory variability, suggesting right atrial pressure of 3 mmHg.   8. Challenging images   Echo 07/2019   1. Left ventricular ejection fraction, by estimation, is 50 to 55%. The  left ventricle has low normal function. The left ventricle demonstrates  global hypokinesis. There is mild left ventricular hypertrophy. Left  ventricular diastolic parameters are  consistent with Grade II diastolic dysfunction (pseudonormalization).   2. Right ventricular systolic function is normal. The right ventricular  size is mildly enlarged. Tricuspid  regurgitation signal is inadequate for  assessing PA pressure.   3. Left atrial size was mildly dilated.  EKG:  EKG is ordered today.  The ekg ordered today demonstrates NSR 78bpm, LAD, IVCD, TWI aVL  Recent Labs: 11/22/2021: TSH 0.842 11/24/2021: Magnesium 2.1 11/28/2021: NT-Pro BNP 966 11/29/2021: ALT 21; B Natriuretic Peptide 66.0; BUN 71; Creatinine, Ser 1.87; Hemoglobin 12.3; Platelets 367; Potassium 4.3; Sodium 137  Recent Lipid Panel    Component Value Date/Time   CHOL 162 11/22/2021 1158   CHOL 154 05/19/2020 1139   TRIG 135 11/22/2021 1158   HDL 30 (L) 11/22/2021 1158   HDL 39 (L) 05/19/2020 1139   CHOLHDL 5.4 11/22/2021 1158   VLDL 27 11/22/2021 1158   LDLCALC 105 (H) 11/22/2021 1158   LDLCALC 100 (H) 05/19/2020 1139     Physical Exam:    VS:  BP (!) 90/50 (BP Location: Left Arm, Patient Position: Sitting, Cuff Size: Large)   Pulse 78   Ht 5\' 10"  (1.778 m)   Wt (!) 330 lb (149.7 kg)   SpO2 98%   BMI 47.35 kg/m     Wt Readings from Last 3 Encounters:  12/17/21 (!) 330 lb (149.7 kg)  12/04/21 (!) 340 lb (154.2 kg)  11/29/21 (!) 330 lb 11 oz (150 kg)     GEN:  Well nourished, well developed in no acute distress HEENT: Normal NECK: No JVD; No carotid bruits LYMPHATICS: No lymphadenopathy CARDIAC: RRR, no murmurs, rubs, gallops RESPIRATORY:  Clear to auscultation without rales, wheezing or rhonchi  ABDOMEN: Soft, non-tender, non-distended MUSCULOSKELETAL:  trace lower leg edema; No deformity  SKIN: Warm and dry NEUROLOGIC:  Alert and oriented x 3 PSYCHIATRIC:  Normal affect   ASSESSMENT:    1. HFrEF (heart failure with reduced ejection fraction) ([place])   2. Essential hypertension   3. OSA (obstructive sleep apnea)   4. Lymphedema   5. Stage 3 chronic kidney disease, unspecified whether stage 3a or 3b CKD (HCC)    PLAN:    In order of problems listed above:  Chronic systolic heart failure LVEF 30-35% Lymphedema Recent admission for intracranial  hemorrhage, hypertensive urgency, medication noncompliance and acute heart failure. Echo showed reduced EF 30-35%. No cath was done due to CKD. He denies chest pain. He has chronic unchanged lymphedema/LLE. I will get a Myoview Lexiscan to check for ischemia. BP is low today and he is dizzy. I will stop Hydralazine due to low BP. Continue Coreg 12.5mg BID and Irbesartan 300mg  daily. He reports low salt diet and leg elevation. He is taking  Torsemide 20mg  daily. I will check a BMET today. If this is OK, will consider adding HALP3X. CKD complicating GDMT.   HTN Elevated in the hospital, now taking Hydralazine 50mg  BID, Coreg 12.5mg BID and Irbesartan 300mg  daily. BP is low today and he is feeling dizzy, was unable to work yesterday. I will stop Hydralazine and re-evaluate symptoms and BP at follow-up.   ICH He will continue to follow with Neurology. Ok to restart ASA at discharge.   OSA Can refer to pulmonology for sleep study at follow-up  CKD stage 3 Baseline around 1.6-1.7. BMET as above. He may need nephrology referral at follow-up.   Disposition: Follow up in 3 week(s) with MD/APP   Shared Decision Making/Informed Consent   Shared Decision Making/Informed Consent The risks [chest pain, shortness of breath, cardiac arrhythmias, dizziness, blood pressure fluctuations, myocardial infarction, stroke/transient ischemic attack, nausea, vomiting, allergic reaction, radiation exposure, metallic taste sensation and life-threatening complications (estimated to be 1 in 10,000)], benefits (risk stratification, diagnosing coronary artery disease, treatment guidance) and alternatives of a nuclear stress test were discussed in detail with Mr. Lipkin and he agrees to proceed.    Signed, Gloria Lambertson Ninfa Meeker, PA-C  12/17/2021 10:30 AM    [facility]

## 2021-12-17 ENCOUNTER — Encounter: Payer: Self-pay | Admitting: Medical

## 2021-12-17 ENCOUNTER — Other Ambulatory Visit
Admission: RE | Admit: 2021-12-17 | Discharge: 2021-12-17 | Disposition: A | Discharge status: Home / Self Care | Payer: BC Managed Care – PPO | Attending: Medical | Admitting: Medical

## 2021-12-17 ENCOUNTER — Ambulatory Visit: Payer: BC Managed Care – PPO | Attending: Medical | Admitting: Medical

## 2021-12-17 VITALS — BP 90/50 | HR 78 | Ht 70.0 in | Wt 330.0 lb

## 2021-12-17 DIAGNOSIS — I890 Lymphedema, not elsewhere classified: Secondary | ICD-10-CM

## 2021-12-17 DIAGNOSIS — N183 Chronic kidney disease, stage 3 unspecified: Secondary | ICD-10-CM

## 2021-12-17 DIAGNOSIS — G4733 Obstructive sleep apnea (adult) (pediatric): Secondary | ICD-10-CM | POA: Diagnosis not present

## 2021-12-17 DIAGNOSIS — I5020 Unspecified systolic (congestive) heart failure: Secondary | ICD-10-CM | POA: Insufficient documentation

## 2021-12-17 DIAGNOSIS — I10 Essential (primary) hypertension: Secondary | ICD-10-CM | POA: Diagnosis not present

## 2021-12-17 LAB — BASIC METABOLIC PANEL
Anion gap: 10 (ref 5–15)
BUN: 42 mg/dL — ABNORMAL HIGH (ref 6–20)
CO2: 28 mmol/L (ref 22–32)
Calcium: 9.1 mg/dL (ref 8.9–10.3)
Chloride: 101 mmol/L (ref 98–111)
Creatinine, Ser: 2.55 mg/dL — ABNORMAL HIGH (ref 0.61–1.24)
GFR, Estimated: 29 mL/min — ABNORMAL LOW (ref >60)
Glucose, Bld: 86 mg/dL (ref 70–99)
Potassium: 4.3 mmol/L (ref 3.5–5.1)
Sodium: 139 mmol/L (ref 135–145)

## 2021-12-17 NOTE — Patient Instructions (Signed)
Medication Instructions:  Your physician has recommended you make the following change in your medication:   STOP Hydralazine  Continue your other medications as prescribed.  *If you need a refill on your cardiac medications before your next appointment, please call your pharmacy*   Lab Work: Bmp today. Please have your lab drawn at the [facility]. Stop at the Registration desk to check in.  If you have labs (blood work) drawn today and your tests are completely normal, you will receive your results only by: MyChart Message (if you have MyChart) OR A paper copy in the mail If you have any lab test that is abnormal or we need to change your treatment, we will call you to review the results.   Testing/Procedures: Your physician has requested that you have a lexiscan myoview. For further information please visit [email or web address] Please follow instruction sheet, as given.    Follow-Up: At [facility], you and your health needs are our priority.  As part of our continuing mission to provide you with exceptional heart care, we have created designated Provider Care Teams.  These Care Teams include your primary Cardiologist (physician) and Advanced Practice Providers (APPs -  Physician Assistants and Nurse Practitioners) who all work together to provide you with the care you need, when you need it.  We recommend signing up for the patient portal called "MyChart".  Sign up information is provided on this After Visit Summary.  MyChart is used to connect with patients for Virtual Visits (Telemedicine).  Patients are able to view lab/test results, encounter notes, upcoming appointments, etc.  Non-urgent messages can be sent to your provider as well.   To learn more about what you can do with MyChart, go to [email or web address].    Your next appointment:   3-4 week(s)  The format for your next appointment:   In Person  Provider:   You may see Debbe Odea, MD or  one of the following Advanced Practice Providers on your designated Care Team:    Cadence Fransico Michael, [place]  Other Instructions [facility] MYOVIEW  Your caregiver has ordered a Stress Test with nuclear imaging. The purpose of this test is to evaluate the blood supply to your heart muscle. This procedure is referred to as a "Non-Invasive Stress Test." This is because other than having an IV started in your vein, nothing is inserted or "invades" your body. Cardiac stress tests are done to find areas of poor blood flow to the heart by determining the extent of coronary artery disease (CAD). Some patients exercise on a treadmill, which naturally increases the blood flow to your heart, while others who are  unable to walk on a treadmill due to physical limitations have a pharmacologic/chemical stress agent called Lexiscan . This medicine will mimic walking on a treadmill by temporarily increasing your coronary blood flow.   Please note: these test may take anywhere between 2-4 hours to complete  PLEASE REPORT TO [facility]TR[facility]  THE VOLUNTEERS AT THE FIRST DESK WILL DIRECT YOU WHERE TO GO  Date of Procedure:_____________________________________  Arrival Time for Procedure:______________________________  Instructions regarding medication:   __X__ : Hold diabetes medication morning of procedure   PLEASE NOTIFY THE OFFICE AT LEAST 24 HOURS IN ADVANCE IF YOU ARE UNABLE TO KEEP YOUR APPOINTMENT.  [phone] AND  PLEASE NOTIFY NUCLEAR MEDICINE AT [facility] AT LEAST 24 HOURS IN ADVANCE IF YOU ARE UNABLE TO KEEP YOUR APPOINTMENT. [phone]  How to prepare for your Myoview test:  Do  not eat or drink after midnight No caffeine for 24 hours prior to test No smoking 24 hours prior to test. Your medication may be taken with water.  If your doctor stopped a medication because of this test, do not take that medication. Please wear a short sleeve shirt. No cologne or lotion. Wear comfortable walking  shoes.

## 2021-12-18 ENCOUNTER — Telehealth: Payer: Self-pay

## 2021-12-18 DIAGNOSIS — I10 Essential (primary) hypertension: Secondary | ICD-10-CM

## 2021-12-18 NOTE — Telephone Encounter (Signed)
Called both the patient and his sister to give lab results and CF recommendation. Lmtcb on both of their voice mails.

## 2021-12-18 NOTE — Telephone Encounter (Signed)
-----   Message from [place], PA-C sent at 12/18/2021 11:24 AM EDT ----- Kidney function is much worse on the Irbesartan. Lets stop irbesartan and restart the hydralazine 50mg  BID. Repeat BMEt in 1 week.

## 2021-12-20 NOTE — Telephone Encounter (Signed)
Attempted again to reach both the patient and his sister [name]. Lmtcb x2.

## 2021-12-24 ENCOUNTER — Other Ambulatory Visit: Payer: Medicaid Other

## 2021-12-24 ENCOUNTER — Encounter: Admission: RE | Admit: 2021-12-24 | Payer: BC Managed Care – PPO | Source: Ambulatory Visit

## 2021-12-25 MED ORDER — HYDRALAZINE HCL 50 MG PO TABS: 50.0000 mg | ORAL_TABLET | Freq: Two times a day (BID) | ORAL | 5 refills | Status: DC

## 2021-12-25 NOTE — Telephone Encounter (Signed)
Lamar Laundry, RN  12/24/2021  4:57 PM EDT Back to Top    3rd attempt to reach the pt by telephone. Lmtcb. Patient has reviewed results and CF comments in mychart

## 2022-01-01 ENCOUNTER — Encounter
Admission: RE | Admit: 2022-01-01 | Discharge: 2022-01-01 | Disposition: A | Discharge status: Home / Self Care | Payer: BC Managed Care – PPO | Source: Ambulatory Visit | Attending: Medical | Admitting: Medical

## 2022-01-01 DIAGNOSIS — I5020 Unspecified systolic (congestive) heart failure: Secondary | ICD-10-CM | POA: Insufficient documentation

## 2022-01-01 MED ORDER — TECHNETIUM TC 99M TETROFOSMIN IV KIT
30.0000 | PACK | Freq: Once | INTRAVENOUS | Status: AC | PRN
Start: 2022-01-01 — End: 2022-01-01
  Administered 2022-01-01: 31.14 via INTRAVENOUS

## 2022-01-01 MED ORDER — REGADENOSON 0.4 MG/5ML IV SOLN
0.4000 mg | Freq: Once | INTRAVENOUS | Status: AC
  Administered 2022-01-01: 0.4 mg via INTRAVENOUS

## 2022-01-02 ENCOUNTER — Encounter
Admission: RE | Admit: 2022-01-02 | Discharge: 2022-01-02 | Disposition: A | Discharge status: Home / Self Care | Payer: BC Managed Care – PPO | Source: Ambulatory Visit | Attending: Medical | Admitting: Medical

## 2022-01-02 MED ORDER — TECHNETIUM TC 99M TETROFOSMIN IV KIT
30.5000 | PACK | Freq: Once | INTRAVENOUS | Status: AC | PRN
  Administered 2022-01-02: 30.5 via INTRAVENOUS

## 2022-01-07 ENCOUNTER — Other Ambulatory Visit
Admission: RE | Admit: 2022-01-07 | Discharge: 2022-01-07 | Disposition: A | Discharge status: Home / Self Care | Payer: BC Managed Care – PPO | Source: Ambulatory Visit | Attending: Medical | Admitting: Medical

## 2022-01-07 ENCOUNTER — Telehealth: Payer: Self-pay

## 2022-01-07 ENCOUNTER — Encounter: Payer: Self-pay | Admitting: Medical

## 2022-01-07 ENCOUNTER — Ambulatory Visit: Payer: BC Managed Care – PPO | Attending: Medical | Admitting: Medical

## 2022-01-07 VITALS — BP 118/62 | HR 68 | Ht 70.0 in | Wt 330.0 lb

## 2022-01-07 DIAGNOSIS — Z79899 Other long term (current) drug therapy: Secondary | ICD-10-CM

## 2022-01-07 DIAGNOSIS — I5020 Unspecified systolic (congestive) heart failure: Secondary | ICD-10-CM | POA: Diagnosis present

## 2022-01-07 DIAGNOSIS — R0602 Shortness of breath: Secondary | ICD-10-CM | POA: Diagnosis not present

## 2022-01-07 DIAGNOSIS — I10 Essential (primary) hypertension: Secondary | ICD-10-CM

## 2022-01-07 DIAGNOSIS — I890 Lymphedema, not elsewhere classified: Secondary | ICD-10-CM | POA: Diagnosis not present

## 2022-01-07 DIAGNOSIS — N183 Chronic kidney disease, stage 3 unspecified: Secondary | ICD-10-CM

## 2022-01-07 DIAGNOSIS — I2510 Atherosclerotic heart disease of native coronary artery without angina pectoris: Secondary | ICD-10-CM | POA: Diagnosis not present

## 2022-01-07 DIAGNOSIS — G4733 Obstructive sleep apnea (adult) (pediatric): Secondary | ICD-10-CM

## 2022-01-07 LAB — BASIC METABOLIC PANEL
Anion gap: 9 (ref 5–15)
BUN: 23 mg/dL — ABNORMAL HIGH (ref 6–20)
CO2: 27 mmol/L (ref 22–32)
Calcium: 8.8 mg/dL — ABNORMAL LOW (ref 8.9–10.3)
Chloride: 101 mmol/L (ref 98–111)
Creatinine, Ser: 1.84 mg/dL — ABNORMAL HIGH (ref 0.61–1.24)
GFR, Estimated: 44 mL/min — ABNORMAL LOW (ref >60)
Glucose, Bld: 109 mg/dL — ABNORMAL HIGH (ref 70–99)
Potassium: 3.7 mmol/L (ref 3.5–5.1)
Sodium: 137 mmol/L (ref 135–145)

## 2022-01-07 NOTE — Progress Notes (Signed)
Cardiology Office Note:    Date:  01/07/2022   ID:  YUCHEN ROVINSKY, DOB 03-Feb-1971, MRN [id number]  PCP:  [name], PA-C  [facility] Cardiologist:  Kate Sable, MD  [facility] Electrophysiologist:  None   Referring MD: [name], PA-C   Chief Complaint: testing follow-up  History of Present Illness:    Jeremiah Vance is a 51 y.o. male with a hx of medication noncompliance, HTN, morbid obesity, chronic diastolic CHF, poorly controlled diabetes Type 2, stroke x2, OSA noncompliant with CPAP, chronic lower extremity edema and CKD who presents for 6 month follow-up.   Admitted in 07/2019 with CHF. Echo showed LVEF 50-55%. He was diuresed with IV lasix.    Admitted in August 2023 for headache, dizziness, and slurred speech. CT head showed small acute hemorrhage. BP in the ER was high and he was started on nicardipine infusion with improvement of BP. He was transitioned to irbesartan, Coreg and Hydralazine. UDS positive for tricyclic antidepressant. ASA was started at d/c. Echo showed LVEF 30-35%, G1DD, He was diuresed and sent home on torsemide 40mg  daily. LHC was not performed during admission due to CKD and small ICH.   He went to the ED 11/29/21 for chest pain. Vitals were normal. HS troponin was minimally elevated to 44. Ddimer was elevated, CT chest was negative. HE was offered admission, but declined.   Last seen 12/17/21 and reported dizziness. BP was low, so Hydralazine was stopped. For reduced EF, a Myoview Lexsican was ordered.  Labs showed worsening Scr/BUN and irbesartan was stopped and hydralazine was restarted.   Today, the patient reports he is doing well. He started walking, he is walking the dog about every day. He feels good doing this. He reports chronic unchanged lower leg edema. He will see his PCP later this week. Myoview Lexiscan images have not been read. He denies chest pain or SOB.   Past Medical History:  Diagnosis Date   Acute ischemic  stroke ([place]) 09/06/2017   Acute renal failure superimposed on stage 3a chronic kidney disease ([place]) 07/08/2019   Acute right-sided weakness    Allergies    Anasarca    Anemia 07/10/2017   Arthritis    CHF (congestive heart failure) ([place])    "coinsided w/kidney problems I was having 06/2017"   Chicken pox    CKD (chronic kidney disease) stage 3, GFR 30-59 ml/min (HCC) 06/2017   Depression    Elevated troponin 07/08/2019   High cholesterol    History of cardiomyopathy    LVEF 40 to 45% in April 2019 - subsequently normalized   Hyperbilirubinemia 07/10/2017   Hypertension    Ischemic stroke ([place])    Small left internal capsule infarct due to lacunar disease   Morbid obesity ([place])    Normocytic anemia 12/16/2017   Recurrent incisional hernia with incarceration s/p repair 10/22/2017 10/21/2017   Stroke ([place]) 08/2017   "right sided weakness since; getting stronger though" (10/22/2017)   Type 2 diabetes mellitus ([place])     Past Surgical History:  Procedure Laterality Date   ABDOMINAL HERNIA REPAIR  2008; 10/22/2017   "scope; OPEN REPAIR INCARCERATED VENTRAL HERNIA   HERNIA REPAIR     KNEE ARTHROSCOPY Right 1989   VENTRAL HERNIA REPAIR N/A 10/22/2017   Procedure: OPEN REPAIR INCARCERATED VENTRAL HERNIA;  Surgeon: Excell Seltzer, MD;  Location: [place];  Service: General;  Laterality: N/A;    Current Medications: Current Meds  Medication Sig   aspirin EC 81 MG tablet  Take 1 tablet (81 mg total) by mouth daily. Swallow whole.   atorvastatin (LIPITOR) 40 MG tablet Take 1 tablet (40 mg total) by mouth daily at 6 PM.   carvedilol (COREG) 12.5 MG tablet Take 1 tablet (12.5 mg total) by mouth 2 (two) times daily with a meal.   glimepiride (AMARYL) 4 MG tablet Take 1 tablet (4 mg total) by mouth daily with breakfast.   hydrALAZINE (APRESOLINE) 50 MG tablet Take 1 tablet (50 mg total) by mouth in the morning and at bedtime.   insulin degludec (TRESIBA FLEXTOUCH) 200 UNIT/ML FlexTouch Pen Inject 16  Units into the skin daily.   linagliptin (TRADJENTA) 5 MG TABS tablet Take 1 tablet (5 mg total) by mouth daily.   torsemide (DEMADEX) 20 MG tablet Take 2 tablets (40 mg total) by mouth daily.     Allergies:   Pollen extract and Sulfa antibiotics   Social History   Socioeconomic History   Marital status: Divorced    Spouse name: Not on file   Number of children: Not on file   Years of education: Not on file   Highest education level: Not on file  Occupational History   Not on file  Tobacco Use   Smoking status: Never   Smokeless tobacco: Never  Vaping Use   Vaping Use: Never used  Substance and Sexual Activity   Alcohol use: Yes    Comment: occasional beer   Drug use: Never   Sexual activity: Not Currently  Other Topics Concern   Not on file  Social History Narrative   He lives with his sister , [name] and she is his MPOA after his stokes   Social Determinants of Health   Financial Resource Strain: High Risk (03/20/2020)   Overall Financial Resource Strain (CARDIA)    Difficulty of Paying Living Expenses: Hard  Food Insecurity: Not on file  Transportation Needs: Unmet Transportation Needs (03/09/2020)   PRAPARE - Hydrologist (Medical): Yes    Lack of Transportation (Non-Medical): Yes  Physical Activity: Not on file  Stress: Not on file  Social Connections: Not on file     Family History: The patient's family history includes Arthritis in his father, mother, paternal grandfather, and paternal grandmother; Depression in his maternal grandmother, mother, and sister; Diabetes in his father, maternal grandmother, mother, sister, and sister; Hearing loss in his father and paternal grandmother; Heart attack in his father and paternal grandfather; Heart disease in his father, maternal grandmother, mother, and paternal grandfather; Hyperlipidemia in his father, maternal grandfather, and maternal grandmother; Hypertension in his father, maternal  grandfather, maternal grandmother, mother, and sister; Learning disabilities in his mother; Mental illness in his mother and sister; Stroke in his father, maternal grandfather, maternal grandmother, mother, and paternal grandfather.  ROS:   Please see the history of present illness.     All other systems reviewed and are negative.  EKGs/Labs/Other Studies Reviewed:    The following studies were reviewed today:   Echo 10/2021  1. Left ventricular ejection fraction, by estimation, is 30 to 35%. The  left ventricle has moderately decreased function. The left ventricle  demonstrates global hypokinesis. There is severe left ventricular  hypertrophy. Left ventricular diastolic  parameters are consistent with Grade I diastolic dysfunction (impaired  relaxation).   2. Right ventricular systolic function is mildly reduced. The right  ventricular size is mildly enlarged. There is normal pulmonary artery  systolic pressure. The estimated right ventricular systolic pressure is  22.7 mmHg.   3. Left atrial size was moderately dilated.   4. The mitral valve is normal in structure. No evidence of mitral valve  regurgitation. No evidence of mitral stenosis.   5. The aortic valve is normal in structure. Aortic valve regurgitation is  not visualized. Aortic valve sclerosis is present, with no evidence of  aortic valve stenosis.   6. There is mild dilatation of the aortic root, measuring 42 mm.   7. The inferior vena cava is normal in size with greater than 50%  respiratory variability, suggesting right atrial pressure of 3 mmHg.   8. Challenging images    Echo 07/2019   1. Left ventricular ejection fraction, by estimation, is 50 to 55%. The  left ventricle has low normal function. The left ventricle demonstrates  global hypokinesis. There is mild left ventricular hypertrophy. Left  ventricular diastolic parameters are  consistent with Grade II diastolic dysfunction (pseudonormalization).   2.  Right ventricular systolic function is normal. The right ventricular  size is mildly enlarged. Tricuspid regurgitation signal is inadequate for  assessing PA pressure.   3. Left atrial size was mildly dilated.   EKG:  EKG is ordered today.  The ekg ordered today demonstrates NSR, 68bpm, LAD, TWI aVL, no changes  Recent Labs: 11/22/2021: TSH 0.842 11/24/2021: Magnesium 2.1 11/28/2021: NT-Pro BNP 966 11/29/2021: ALT 21; B Natriuretic Peptide 66.0; Hemoglobin 12.3; Platelets 367 12/17/2021: BUN 42; Creatinine, Ser 2.55; Potassium 4.3; Sodium 139  Recent Lipid Panel    Component Value Date/Time   CHOL 162 11/22/2021 1158   CHOL 154 05/19/2020 1139   TRIG 135 11/22/2021 1158   HDL 30 (L) 11/22/2021 1158   HDL 39 (L) 05/19/2020 1139   CHOLHDL 5.4 11/22/2021 1158   VLDL 27 11/22/2021 1158   LDLCALC 105 (H) 11/22/2021 1158   LDLCALC 100 (H) 05/19/2020 1139     Physical Exam:    VS:  BP 118/62   Pulse 68   Ht 5\' 10"  (1.778 m)   Wt (!) 330 lb (149.7 kg)   SpO2 94%   BMI 47.35 kg/m     Wt Readings from Last 3 Encounters:  01/07/22 (!) 330 lb (149.7 kg)  12/17/21 (!) 330 lb (149.7 kg)  12/04/21 (!) 340 lb (154.2 kg)     GEN:  Well nourished, well developed in no acute distress HEENT: Normal NECK: No JVD; No carotid bruits LYMPHATICS: No lymphadenopathy CARDIAC: RRR, no murmurs, rubs, gallops RESPIRATORY:  Clear to auscultation without rales, wheezing or rhonchi  ABDOMEN: Soft, non-tender, non-distended MUSCULOSKELETAL:  No edema; No deformity  SKIN: Warm and dry NEUROLOGIC:  Alert and oriented x 3 PSYCHIATRIC:  Normal affect   ASSESSMENT:    1. HFrEF (heart failure with reduced ejection fraction) (HCC)   2. Lymphedema   3. Coronary artery disease involving native coronary artery of native heart without angina pectoris   4. Shortness of breath   5. OSA (obstructive sleep apnea)   6. Stage 3 chronic kidney disease, unspecified whether stage 3a or 3b CKD ([place])   7.  Essential hypertension    PLAN:    In order of problems listed above:  HFrEF Chronic systolic heart failure Lymphedema Echo during admission showed reduced LVEF 30-35%, prior EF was 50-55%. Cath was deferred for CKD and small ICH. The patient denies chest pain. A Myoview Lexiscan was ordered, however has not been read yet. Most recent labs showed worsening creatinine and Irbesartan was stopped. BMET today, if this is  improved, plan to start [place] 10mg  daily. May also consider LHC depending on MPI results. Continue Coreg and Hydralazine. Continue Torsemide 40mg  daily.   Coronary calcifications Noted on chest CTA 10/2021. No anginal symptoms reported. Plan as above. Continue ASA, Coreg and Lipitor.   HTN BP is good today, continue Hydralazine and Coreg.   CKD stage 3 Baseline around 1.6-1.7. Follow-up labs on Irbesartan showed worsening creatinine to 2.55, so this was stopped. BMET today.  OSA We will refer to pulmonology for sleep study.   Disposition: Follow up in 1 month(s) with MD/APP     Signed, Kandiss Ihrig Ninfa Meeker, PA-C  01/07/2022 9:02 AM    [facility]

## 2022-01-07 NOTE — Patient Instructions (Signed)
Medication Instructions:   Your physician recommends that you continue on your current medications as directed. Please refer to the Current Medication list given to you today.  *If you need a refill on your cardiac medications before your next appointment, please call your pharmacy*   Lab Work:  Your provider recommends you go to the medical mall to have lab work completed.   If you have labs (blood work) drawn today and your tests are completely normal, you will receive your results only by: [place] (if you have MyChart) OR A paper copy in the mail If you have any lab test that is abnormal or we need to change your treatment, we will call you to review the results.   Testing/Procedures:  None Ordered   Follow-Up: At [facility], you and your health needs are our priority.  As part of our continuing mission to provide you with exceptional heart care, we have created designated Provider Care Teams.  These Care Teams include your primary Cardiologist (physician) and Advanced Practice Providers (APPs -  Physician Assistants and Nurse Practitioners) who all work together to provide you with the care you need, when you need it.  We recommend signing up for the patient portal called "MyChart".  Sign up information is provided on this After Visit Summary.  MyChart is used to connect with patients for Virtual Visits (Telemedicine).  Patients are able to view lab/test results, encounter notes, upcoming appointments, etc.  Non-urgent messages can be sent to your provider as well.   To learn more about what you can do with MyChart, go to [email or web address].    Your next appointment:   1 month(s)  The format for your next appointment:   In Person  Provider:   You may see Kate Sable, MD or one of the following Advanced Practice Providers on your designated Care Team:   Murray Hodgkins, NP Christell Faith, PA-C Cadence Kathlen Mody, PA-C Gerrie Nordmann, NP

## 2022-01-07 NOTE — Telephone Encounter (Signed)
-----   Message from [place], PA-C sent at 01/07/2022 12:22 PM EDT ----- Labs are back to baseline. We will start Farxiga 10mg  daily. BMET in 2 weeks.

## 2022-01-07 NOTE — Telephone Encounter (Signed)
Left message for pt to call back  °

## 2022-01-08 LAB — NM MYOCAR MULTI W/SPECT W/WALL MOTION / EF
LV dias vol: 130 mL (ref 62–150)
LV sys vol: 71 mL
Nuc Stress EF: 60 %
Peak HR: 80 {beats}/min
Rest HR: 64 {beats}/min
Rest Nuclear Isotope Dose: 30.5 mCi
SDS: 0
SRS: 0
SSS: 0
Stress Nuclear Isotope Dose: 31.1 mCi
TID: 1.07

## 2022-01-08 MED ORDER — DAPAGLIFLOZIN PROPANEDIOL 10 MG PO TABS
ORAL_TABLET | ORAL | 6 refills | Status: DC
Start: 2022-01-08 — End: 2022-08-27

## 2022-01-08 NOTE — Telephone Encounter (Signed)
I spoke with the patient regarding his lab results and recommendations from Cadence [place], [place] to:  1) START Farxiga 10 mg once daily 2) Repeat a BMP in 2 weeks (at the [facility]).  I have offered samples and a free 30-day trial card to the patient for [facility] [place], but he advised he does not have a ride to come pick these up. He would prefer the RX go straight to the pharmacy.  I inquired if he will be able to arrange for a ride for lab work in 2 weeks. He states he is off on Tuesdays & Thursdays and will go on one of those days in 2 weeks.   The patient voices understanding of all of the above and is agreeable.

## 2022-01-09 ENCOUNTER — Ambulatory Visit (INDEPENDENT_AMBULATORY_CARE_PROVIDER_SITE_OTHER): Payer: BC Managed Care – PPO | Admitting: Physician Assistant

## 2022-01-09 ENCOUNTER — Encounter: Payer: Self-pay | Admitting: Physician Assistant

## 2022-01-09 VITALS — BP 124/96 | HR 70 | Resp 16 | Ht 70.0 in | Wt 328.0 lb

## 2022-01-09 DIAGNOSIS — G4730 Sleep apnea, unspecified: Secondary | ICD-10-CM

## 2022-01-09 DIAGNOSIS — I10 Essential (primary) hypertension: Secondary | ICD-10-CM | POA: Diagnosis not present

## 2022-01-09 DIAGNOSIS — N183 Chronic kidney disease, stage 3 unspecified: Secondary | ICD-10-CM | POA: Diagnosis not present

## 2022-01-09 DIAGNOSIS — I639 Cerebral infarction, unspecified: Secondary | ICD-10-CM

## 2022-01-09 DIAGNOSIS — I2584 Coronary atherosclerosis due to calcified coronary lesion: Secondary | ICD-10-CM

## 2022-01-09 DIAGNOSIS — Z23 Encounter for immunization: Secondary | ICD-10-CM | POA: Diagnosis not present

## 2022-01-09 DIAGNOSIS — E1165 Type 2 diabetes mellitus with hyperglycemia: Secondary | ICD-10-CM

## 2022-01-09 DIAGNOSIS — I2510 Atherosclerotic heart disease of native coronary artery without angina pectoris: Secondary | ICD-10-CM

## 2022-01-09 DIAGNOSIS — R7989 Other specified abnormal findings of blood chemistry: Secondary | ICD-10-CM

## 2022-01-09 DIAGNOSIS — I890 Lymphedema, not elsewhere classified: Secondary | ICD-10-CM

## 2022-01-09 DIAGNOSIS — I5022 Chronic systolic (congestive) heart failure: Secondary | ICD-10-CM

## 2022-01-09 NOTE — Progress Notes (Signed)
I,April Miller,acting as a Education administrator for [employer], PA-C.,have documented all relevant documentation on the behalf of [name], PA-C,as directed by  [employer], PA-C while in the presence of [employer], PA-C.   Established patient visit   Patient: Jeremiah Vance   DOB: 1970-11-17   [name]  MRN: [id number] Visit Date: 01/09/2022  Today's healthcare provider: [name], PA-C   Chief Complaint  Patient presents with   Follow-up   Diabetes   Hypertension   Subjective    HPI  Diabetes Mellitus Type II, follow-up  Lab Results  Component Value Date   HGBA1C 10.5 (H) 11/22/2021   HGBA1C 12.1 (H) 05/19/2020   HGBA1C 12.0 (H) 02/20/2020   Last seen for diabetes 2 months ago.  Management since then includes continuing the same treatment.  Home blood sugar records: fasting range: 100-120 Most Recent Eye Exam: due?  --------------------------------------------------------------------------------------------------- Hypertension, follow-up  BP Readings from Last 3 Encounters:  01/09/22 (!) 124/96  01/07/22 118/62  12/17/21 (!) 90/50   Wt Readings from Last 3 Encounters:  01/09/22 (!) 328 lb (148.8 kg)  01/07/22 (!) 330 lb (149.7 kg)  12/17/21 (!) 330 lb (149.7 kg)     He was last seen for hypertension 6 weeks ago.  Management since that visit includes; Will reassess at his next appt. Home bp's 90/50--120/70. Per chart review Pt was seen by Cardiology on 01/07/22 for HFeEF, chronic systolic HF, lymphedema, coronary calcifications on chest CT in 10/2021, HTN, CKD stage 3 and OSA.Echo during admission showed reduced LVEF 30-35%, prior EF was 50-55%. Cath was deferred for CKD and small ICH. Pt needs to fu in a mo  ---------------------------------------------------------------------------------------------------   Medications: Outpatient Medications Prior to Visit  Medication Sig   aspirin EC 81 MG tablet Take 1 tablet (81 mg total) by mouth daily.  Swallow whole.   atorvastatin (LIPITOR) 40 MG tablet Take 1 tablet (40 mg total) by mouth daily at 6 PM.   carvedilol (COREG) 12.5 MG tablet Take 1 tablet (12.5 mg total) by mouth 2 (two) times daily with a meal.   dapagliflozin propanediol (FARXIGA) 10 MG TABS tablet Take 1 tablet (10 mg) by mouth once daily   glimepiride (AMARYL) 4 MG tablet Take 1 tablet (4 mg total) by mouth daily with breakfast.   hydrALAZINE (APRESOLINE) 50 MG tablet Take 1 tablet (50 mg total) by mouth in the morning and at bedtime.   linagliptin (TRADJENTA) 5 MG TABS tablet Take 1 tablet (5 mg total) by mouth daily.   torsemide (DEMADEX) 20 MG tablet Take 2 tablets (40 mg total) by mouth daily.   insulin degludec (TRESIBA FLEXTOUCH) 200 UNIT/ML FlexTouch Pen Inject 16 Units into the skin daily. (Patient not taking: Reported on 01/09/2022)   No facility-administered medications prior to visit.    Review of Systems  Constitutional:  Negative for appetite change, chills and fever.  Respiratory:  Negative for chest tightness, shortness of breath and wheezing.   Cardiovascular:  Negative for chest pain and palpitations.  Gastrointestinal:  Negative for abdominal pain, nausea and vomiting.       Objective    BP (!) 124/96 (BP Location: Right Arm, Patient Position: Sitting, Cuff Size: Large)   Pulse 70   Resp 16   Ht 5\' 10"  (1.778 m)   Wt (!) 328 lb (148.8 kg)   SpO2 97%   BMI 47.06 kg/m    Physical Exam Vitals reviewed.  Constitutional:  General: He is not in acute distress.    Appearance: Normal appearance. He is obese. He is not diaphoretic.  HENT:     Head: Normocephalic and atraumatic.     Nose: Nose normal.  Eyes:     General: No scleral icterus.    Extraocular Movements: Extraocular movements intact.     Conjunctiva/sclera: Conjunctivae normal.  Cardiovascular:     Rate and Rhythm: Normal rate and regular rhythm.     Pulses: Normal pulses.     Heart sounds: Normal heart sounds. No murmur  heard. Pulmonary:     Effort: Pulmonary effort is normal. No respiratory distress.     Breath sounds: Normal breath sounds. No wheezing or rhonchi.  Abdominal:     General: Abdomen is flat. Bowel sounds are normal.     Palpations: Abdomen is soft.  Musculoskeletal:     Cervical back: Normal range of motion and neck supple.     Right lower leg: Edema present.     Left lower leg: Edema present.  Lymphadenopathy:     Cervical: No cervical adenopathy.  Skin:    General: Skin is warm and dry.     Findings: No rash.     Comments: Stasis dermatitis bilaterally  Neurological:     Mental Status: He is alert and oriented to person, place, and time. Mental status is at baseline.     Motor: Weakness (left) present.  Psychiatric:        Behavior: Behavior normal.        Thought Content: Thought content normal.        Judgment: Judgment normal.      No results found for any visits on 01/09/22.  Assessment & Plan     Stage 3 chronic kidney disease, unspecified whether stage 3a or 3b CKD (HCC) Creatinine from  was 1.84, with GFR of 44. Could start Farxiga BMP needs to be updated in 2 wk  Essential hypertension BP today 124/96 Continue Hydralazine and coreg Advised an adherence to low salt diet  Sleep apnea, unspecified type Cardiology placed a referral to pulmonology for sleep study  Morbid obesity ([place]) Weight loss of 5% of pt's current weight via healthy diet and daily exercise encouraged.  P/cerebrovascular accident (CVA), unspecified mechanism ([place]) Stable Needs to fu with neurology?  Type 2 diabetes mellitus with hyperglycemia, without long-term current use of insulin (HCC) BS was 140 11/29/21, at home between 90 and 120 Continue amaryl 4mg , insulin 15 units daily Continue to measure BS at home and adjust his medication accordingly Sart farxiga.  Benefits and risks were discussed. Patient agreed and expressed his understanding. Free sample provided.   Need for immunization  against influenza  - Flu Vaccine QUAD 60mo+IM (Fluarix, Fluzone & Alfiuria Quad PF)   Chronic systolic heart failure Lymphedema Coronary calcifications Echo during admission showed reduced LVEF 30-35%, prior EF was 50-55%. Cath was deferred for CKD and small ICH.  Continue coreg, hydralazine, torsemide 40mg , ASA, lipitor Start farxiga 10mg  daily by Cardiology. Did not start due to cost of medication Provided free samples # 28 for pt  FU in a week Pt requested to fill out FMLA forms and other form     The patient was advised to call back or seek an in-person evaluation if the symptoms worsen or if the condition fails to improve as anticipated.  I discussed the assessment and treatment plan with the patient and his sister who accompanied him.The patient was provided an opportunity to ask questions  and all were answered. The patient agreed with the plan and demonstrated an understanding of the instructions.  The entirety of the information documented in the History of Present Illness, Review of Systems and Physical Exam were personally obtained by me. Portions of this information were initially documented by the CMA and reviewed by me for thoroughness and accuracy.  Portions of this note were created using dictation software and may contain typographical errors.       Total encounter time more than 30 minutes  Greater than 50% was spent in counseling and coordination of care with the patient    Elberta Leatherwood  [facility] [phone] (phone) [phone] (fax)  [place]

## 2022-01-10 ENCOUNTER — Telehealth: Payer: Self-pay | Admitting: Physician Assistant

## 2022-01-10 NOTE — Telephone Encounter (Signed)
LMOVM for pt to return call. Okay for pec to advise and schedule appt. Thanks.

## 2022-01-10 NOTE — Telephone Encounter (Signed)
Please, let pt know that I look through his papers and both of these papers need to be filled out in his presence. He needs to schedule an appt for this. Thank you

## 2022-01-10 NOTE — Telephone Encounter (Signed)
FYI

## 2022-01-10 NOTE — Telephone Encounter (Signed)
Pt has an appt on 10.19.23/ he will be ok with having papers completed during that appt / please advise

## 2022-01-11 ENCOUNTER — Telehealth: Payer: Self-pay | Admitting: Medical

## 2022-01-11 NOTE — Telephone Encounter (Signed)
Cadence Ninfa Meeker, PA-C  01/10/2022  9:58 AM EDT     Stress test overall looked good. No significant scar or blockages, normal pump function, minimal coronary artery calcification noted, low risk study . Overall looks good, no further med changes at this point.  Can we repeat a limited echo because last echo showed LVEF 30-35%? And stress test showed normal LVEF

## 2022-01-11 NOTE — Telephone Encounter (Signed)
I spoke with the patient regarding his myoview results and PA recommendations to repeat a limited echo to verify his EF.  The patient voices understanding of these results. He would like to wait until his 02/11/22 appointment with Cadence, PA to review further at that time and them maybe schedule the limited echo.  I advised the patient this is fine and Cadence can discuss with him further at the time of his office visit.   He was appreciative for the call back.

## 2022-01-11 NOTE — Telephone Encounter (Signed)
Attempted to call the patient. No answer- I left a message to please call back for results.  

## 2022-01-11 NOTE — Telephone Encounter (Signed)
Pt returning call

## 2022-01-15 ENCOUNTER — Emergency Department: Payer: BC Managed Care – PPO

## 2022-01-15 ENCOUNTER — Other Ambulatory Visit: Payer: Self-pay

## 2022-01-15 DIAGNOSIS — I639 Cerebral infarction, unspecified: Secondary | ICD-10-CM | POA: Diagnosis not present

## 2022-01-15 DIAGNOSIS — R4789 Other speech disturbances: Secondary | ICD-10-CM | POA: Diagnosis present

## 2022-01-15 DIAGNOSIS — E1122 Type 2 diabetes mellitus with diabetic chronic kidney disease: Secondary | ICD-10-CM | POA: Diagnosis present

## 2022-01-15 DIAGNOSIS — Z7982 Long term (current) use of aspirin: Secondary | ICD-10-CM

## 2022-01-15 DIAGNOSIS — Z882 Allergy status to sulfonamides status: Secondary | ICD-10-CM

## 2022-01-15 DIAGNOSIS — I69154 Hemiplegia and hemiparesis following nontraumatic intracerebral hemorrhage affecting left non-dominant side: Secondary | ICD-10-CM

## 2022-01-15 DIAGNOSIS — Z833 Family history of diabetes mellitus: Secondary | ICD-10-CM

## 2022-01-15 DIAGNOSIS — I6389 Other cerebral infarction: Secondary | ICD-10-CM | POA: Diagnosis present

## 2022-01-15 DIAGNOSIS — E7800 Pure hypercholesterolemia, unspecified: Secondary | ICD-10-CM | POA: Diagnosis present

## 2022-01-15 DIAGNOSIS — R4781 Slurred speech: Secondary | ICD-10-CM | POA: Diagnosis present

## 2022-01-15 DIAGNOSIS — F32A Depression, unspecified: Secondary | ICD-10-CM | POA: Diagnosis present

## 2022-01-15 DIAGNOSIS — R4701 Aphasia: Secondary | ICD-10-CM | POA: Diagnosis present

## 2022-01-15 DIAGNOSIS — I69354 Hemiplegia and hemiparesis following cerebral infarction affecting left non-dominant side: Secondary | ICD-10-CM

## 2022-01-15 DIAGNOSIS — N1831 Chronic kidney disease, stage 3a: Secondary | ICD-10-CM | POA: Diagnosis present

## 2022-01-15 DIAGNOSIS — Z91048 Other nonmedicinal substance allergy status: Secondary | ICD-10-CM

## 2022-01-15 DIAGNOSIS — I429 Cardiomyopathy, unspecified: Secondary | ICD-10-CM | POA: Diagnosis present

## 2022-01-15 DIAGNOSIS — Z794 Long term (current) use of insulin: Secondary | ICD-10-CM

## 2022-01-15 DIAGNOSIS — I63532 Cerebral infarction due to unspecified occlusion or stenosis of left posterior cerebral artery: Principal | ICD-10-CM | POA: Diagnosis present

## 2022-01-15 DIAGNOSIS — Z6841 Body Mass Index (BMI) 40.0 and over, adult: Secondary | ICD-10-CM

## 2022-01-15 DIAGNOSIS — Z862 Personal history of diseases of the blood and blood-forming organs and certain disorders involving the immune mechanism: Secondary | ICD-10-CM

## 2022-01-15 DIAGNOSIS — Z818 Family history of other mental and behavioral disorders: Secondary | ICD-10-CM

## 2022-01-15 DIAGNOSIS — D649 Anemia, unspecified: Secondary | ICD-10-CM | POA: Diagnosis present

## 2022-01-15 DIAGNOSIS — R29707 NIHSS score 7: Secondary | ICD-10-CM | POA: Diagnosis not present

## 2022-01-15 DIAGNOSIS — Z872 Personal history of diseases of the skin and subcutaneous tissue: Secondary | ICD-10-CM

## 2022-01-15 DIAGNOSIS — H5462 Unqualified visual loss, left eye, normal vision right eye: Secondary | ICD-10-CM | POA: Diagnosis present

## 2022-01-15 DIAGNOSIS — E1165 Type 2 diabetes mellitus with hyperglycemia: Secondary | ICD-10-CM | POA: Diagnosis present

## 2022-01-15 DIAGNOSIS — Z7984 Long term (current) use of oral hypoglycemic drugs: Secondary | ICD-10-CM

## 2022-01-15 DIAGNOSIS — Z810 Family history of intellectual disabilities: Secondary | ICD-10-CM

## 2022-01-15 DIAGNOSIS — Z79899 Other long term (current) drug therapy: Secondary | ICD-10-CM

## 2022-01-15 DIAGNOSIS — Z822 Family history of deafness and hearing loss: Secondary | ICD-10-CM

## 2022-01-15 DIAGNOSIS — I480 Paroxysmal atrial fibrillation: Secondary | ICD-10-CM | POA: Diagnosis present

## 2022-01-15 DIAGNOSIS — Z8249 Family history of ischemic heart disease and other diseases of the circulatory system: Secondary | ICD-10-CM

## 2022-01-15 DIAGNOSIS — R4702 Dysphasia: Secondary | ICD-10-CM | POA: Diagnosis present

## 2022-01-15 DIAGNOSIS — D6859 Other primary thrombophilia: Secondary | ICD-10-CM | POA: Diagnosis present

## 2022-01-15 DIAGNOSIS — Z823 Family history of stroke: Secondary | ICD-10-CM

## 2022-01-15 DIAGNOSIS — Z83438 Family history of other disorder of lipoprotein metabolism and other lipidemia: Secondary | ICD-10-CM

## 2022-01-15 DIAGNOSIS — Z8261 Family history of arthritis: Secondary | ICD-10-CM

## 2022-01-15 DIAGNOSIS — Z8619 Personal history of other infectious and parasitic diseases: Secondary | ICD-10-CM

## 2022-01-15 DIAGNOSIS — I5042 Chronic combined systolic (congestive) and diastolic (congestive) heart failure: Secondary | ICD-10-CM | POA: Diagnosis present

## 2022-01-15 DIAGNOSIS — I130 Hypertensive heart and chronic kidney disease with heart failure and stage 1 through stage 4 chronic kidney disease, or unspecified chronic kidney disease: Secondary | ICD-10-CM | POA: Diagnosis present

## 2022-01-15 DIAGNOSIS — Z8719 Personal history of other diseases of the digestive system: Secondary | ICD-10-CM

## 2022-01-15 DIAGNOSIS — M1990 Unspecified osteoarthritis, unspecified site: Secondary | ICD-10-CM | POA: Diagnosis present

## 2022-01-15 LAB — CBC
HCT: 37.3 % — ABNORMAL LOW (ref 39.0–52.0)
Hemoglobin: 11.8 g/dL — ABNORMAL LOW (ref 13.0–17.0)
MCH: 27.6 pg (ref 26.0–34.0)
MCHC: 31.6 g/dL (ref 30.0–36.0)
MCV: 87.1 fL (ref 80.0–100.0)
Platelets: 392 10*3/uL (ref 150–400)
RBC: 4.28 MIL/uL (ref 4.22–5.81)
RDW: 13 % (ref 11.5–15.5)
WBC: 10.1 10*3/uL (ref 4.0–10.5)
nRBC: 0 % (ref 0.0–0.2)

## 2022-01-15 LAB — COMPREHENSIVE METABOLIC PANEL
ALT: 17 U/L (ref 0–44)
AST: 19 U/L (ref 15–41)
Albumin: 3.5 g/dL (ref 3.5–5.0)
Alkaline Phosphatase: 71 U/L (ref 38–126)
Anion gap: 10 (ref 5–15)
BUN: 25 mg/dL — ABNORMAL HIGH (ref 6–20)
CO2: 26 mmol/L (ref 22–32)
Calcium: 8.8 mg/dL — ABNORMAL LOW (ref 8.9–10.3)
Chloride: 102 mmol/L (ref 98–111)
Creatinine, Ser: 1.85 mg/dL — ABNORMAL HIGH (ref 0.61–1.24)
GFR, Estimated: 43 mL/min — ABNORMAL LOW (ref >60)
Glucose, Bld: 107 mg/dL — ABNORMAL HIGH (ref 70–99)
Potassium: 3.6 mmol/L (ref 3.5–5.1)
Sodium: 138 mmol/L (ref 135–145)
Total Bilirubin: 0.9 mg/dL (ref 0.3–1.2)
Total Protein: 7.8 g/dL (ref 6.5–8.1)

## 2022-01-15 LAB — DIFFERENTIAL
Abs Immature Granulocytes: 0.04 10*3/uL (ref 0.00–0.07)
Basophils Absolute: 0.1 10*3/uL (ref 0.0–0.1)
Basophils Relative: 1 %
Eosinophils Absolute: 0.3 10*3/uL (ref 0.0–0.5)
Eosinophils Relative: 3 %
Immature Granulocytes: 0 %
Lymphocytes Relative: 23 %
Lymphs Abs: 2.4 10*3/uL (ref 0.7–4.0)
Monocytes Absolute: 0.8 10*3/uL (ref 0.1–1.0)
Monocytes Relative: 8 %
Neutro Abs: 6.6 10*3/uL (ref 1.7–7.7)
Neutrophils Relative %: 65 %

## 2022-01-15 LAB — ETHANOL: Alcohol, Ethyl (B): <10 mg/dL (ref <10)

## 2022-01-15 LAB — PROTIME-INR
INR: 1.1 (ref 0.8–1.2)
Prothrombin Time: 13.8 seconds (ref 11.4–15.2)

## 2022-01-15 LAB — CBG MONITORING, ED: Glucose-Capillary: 107 mg/dL — ABNORMAL HIGH (ref 70–99)

## 2022-01-15 LAB — APTT: aPTT: 29 seconds (ref 24–36)

## 2022-01-15 MED ORDER — SODIUM CHLORIDE 0.9% FLUSH: 3.0000 mL | Freq: Once | INTRAVENOUS | Status: DC

## 2022-01-15 NOTE — ED Triage Notes (Signed)
Pt arrives with c/o delayed speech and arm numbness that started today. Pt is unsure of the time these symptoms. Per pts sister, the last time she talked him and he was normal  was earlier this morning around 6:30.

## 2022-01-15 NOTE — ED Provider Triage Note (Signed)
Emergency Medicine Provider Triage Evaluation Note  Jeremiah Vance, a 51 y.o. male  was evaluated in triage.  Pt complains of aphasia and left-sided muscle weakness.  Comes via personal vehicle, by his sister.  He has a history of a recent hemorrhagic stroke with some residual left-sided weakness.  Today the sister reports he was last known well at about 630 this morning.  He has complaints of numbness, slurred speech, and gross left-sided weakness.  Review of Systems  Positive: Slurred speech, gross muscle weakness Negative: Head injury  Physical Exam  BP 134/82   Pulse 69   Temp 98.1 F (36.7 C) (Oral)   Resp 19   Wt (!) 148.8 kg   SpO2 97%   BMI 47.06 kg/m  Gen:   Awake, no distress  A&O x 3. Slow responses Resp:  Normal effort CTA MSK:   Moves extremities without difficulty  Other:    Medical Decision Making  Medically screening exam initiated at 7:53 PM.  Appropriate orders placed.  Jeremiah Vance was informed that the remainder of the evaluation will be completed by another provider, this initial triage assessment does not replace that evaluation, and the importance of remaining in the ED until their evaluation is complete.  Patient with a history of recent stroke, who presents with aphasia, weakness, and slurred speech.    Melvenia Needles, PA-C 01/15/22 1958

## 2022-01-16 ENCOUNTER — Inpatient Hospital Stay: Payer: BC Managed Care – PPO

## 2022-01-16 ENCOUNTER — Inpatient Hospital Stay
Admission: EM | Admit: 2022-01-16 | Discharge: 2022-01-17 | DRG: 065 | Disposition: A | Discharge status: Home / Self Care | Payer: BC Managed Care – PPO | Attending: Internal Medicine | Admitting: Internal Medicine

## 2022-01-16 ENCOUNTER — Encounter: Payer: Self-pay | Admitting: Family Medicine

## 2022-01-16 ENCOUNTER — Other Ambulatory Visit: Payer: Self-pay

## 2022-01-16 ENCOUNTER — Inpatient Hospital Stay (HOSPITAL_COMMUNITY)
Admit: 2022-01-16 | Discharge: 2022-01-16 | Disposition: A | Discharge status: Home / Self Care | Payer: BC Managed Care – PPO | Attending: Family Medicine | Admitting: Family Medicine

## 2022-01-16 DIAGNOSIS — D6859 Other primary thrombophilia: Secondary | ICD-10-CM | POA: Diagnosis present

## 2022-01-16 DIAGNOSIS — I10 Essential (primary) hypertension: Secondary | ICD-10-CM

## 2022-01-16 DIAGNOSIS — Z7982 Long term (current) use of aspirin: Secondary | ICD-10-CM | POA: Diagnosis not present

## 2022-01-16 DIAGNOSIS — I5042 Chronic combined systolic (congestive) and diastolic (congestive) heart failure: Secondary | ICD-10-CM | POA: Diagnosis present

## 2022-01-16 DIAGNOSIS — I5033 Acute on chronic diastolic (congestive) heart failure: Secondary | ICD-10-CM

## 2022-01-16 DIAGNOSIS — D649 Anemia, unspecified: Secondary | ICD-10-CM | POA: Diagnosis present

## 2022-01-16 DIAGNOSIS — Z91048 Other nonmedicinal substance allergy status: Secondary | ICD-10-CM | POA: Diagnosis not present

## 2022-01-16 DIAGNOSIS — F32A Depression, unspecified: Secondary | ICD-10-CM | POA: Diagnosis present

## 2022-01-16 DIAGNOSIS — E785 Hyperlipidemia, unspecified: Secondary | ICD-10-CM

## 2022-01-16 DIAGNOSIS — E119 Type 2 diabetes mellitus without complications: Secondary | ICD-10-CM

## 2022-01-16 DIAGNOSIS — I6389 Other cerebral infarction: Secondary | ICD-10-CM | POA: Diagnosis present

## 2022-01-16 DIAGNOSIS — R4701 Aphasia: Secondary | ICD-10-CM | POA: Diagnosis present

## 2022-01-16 DIAGNOSIS — I130 Hypertensive heart and chronic kidney disease with heart failure and stage 1 through stage 4 chronic kidney disease, or unspecified chronic kidney disease: Secondary | ICD-10-CM | POA: Diagnosis present

## 2022-01-16 DIAGNOSIS — I639 Cerebral infarction, unspecified: Secondary | ICD-10-CM | POA: Diagnosis present

## 2022-01-16 DIAGNOSIS — Z6841 Body Mass Index (BMI) 40.0 and over, adult: Secondary | ICD-10-CM | POA: Diagnosis not present

## 2022-01-16 DIAGNOSIS — I69314 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: Secondary | ICD-10-CM | POA: Diagnosis not present

## 2022-01-16 DIAGNOSIS — I429 Cardiomyopathy, unspecified: Secondary | ICD-10-CM | POA: Diagnosis present

## 2022-01-16 DIAGNOSIS — I69154 Hemiplegia and hemiparesis following nontraumatic intracerebral hemorrhage affecting left non-dominant side: Secondary | ICD-10-CM | POA: Diagnosis not present

## 2022-01-16 DIAGNOSIS — Z794 Long term (current) use of insulin: Secondary | ICD-10-CM | POA: Diagnosis not present

## 2022-01-16 DIAGNOSIS — I5032 Chronic diastolic (congestive) heart failure: Secondary | ICD-10-CM | POA: Diagnosis not present

## 2022-01-16 DIAGNOSIS — N1831 Chronic kidney disease, stage 3a: Secondary | ICD-10-CM | POA: Diagnosis present

## 2022-01-16 DIAGNOSIS — Z79899 Other long term (current) drug therapy: Secondary | ICD-10-CM | POA: Diagnosis not present

## 2022-01-16 DIAGNOSIS — Z882 Allergy status to sulfonamides status: Secondary | ICD-10-CM | POA: Diagnosis not present

## 2022-01-16 DIAGNOSIS — E1122 Type 2 diabetes mellitus with diabetic chronic kidney disease: Secondary | ICD-10-CM | POA: Diagnosis present

## 2022-01-16 DIAGNOSIS — I480 Paroxysmal atrial fibrillation: Secondary | ICD-10-CM | POA: Diagnosis present

## 2022-01-16 DIAGNOSIS — E1165 Type 2 diabetes mellitus with hyperglycemia: Secondary | ICD-10-CM | POA: Diagnosis present

## 2022-01-16 DIAGNOSIS — I63532 Cerebral infarction due to unspecified occlusion or stenosis of left posterior cerebral artery: Secondary | ICD-10-CM | POA: Diagnosis present

## 2022-01-16 DIAGNOSIS — I69354 Hemiplegia and hemiparesis following cerebral infarction affecting left non-dominant side: Secondary | ICD-10-CM | POA: Diagnosis not present

## 2022-01-16 DIAGNOSIS — E7800 Pure hypercholesterolemia, unspecified: Secondary | ICD-10-CM | POA: Diagnosis present

## 2022-01-16 LAB — LIPID PANEL
Cholesterol: 106 mg/dL (ref 0–200)
HDL: 24 mg/dL — ABNORMAL LOW (ref >40)
LDL Cholesterol: 61 mg/dL (ref 0–99)
Total CHOL/HDL Ratio: 4.4 RATIO
Triglycerides: 104 mg/dL (ref <150)
VLDL: 21 mg/dL (ref 0–40)

## 2022-01-16 LAB — GLUCOSE, CAPILLARY
Glucose-Capillary: 137 mg/dL — ABNORMAL HIGH (ref 70–99)
Glucose-Capillary: 96 mg/dL (ref 70–99)

## 2022-01-16 LAB — ECHOCARDIOGRAM COMPLETE BUBBLE STUDY
AR max vel: 2.91 cm2
AV Area VTI: 3.2 cm2
AV Area mean vel: 2.85 cm2
AV Mean grad: 5 mmHg
AV Peak grad: 10.2 mmHg
Ao pk vel: 1.6 m/s
Area-P 1/2: 2.66 cm2
S' Lateral: 2.8 cm

## 2022-01-16 LAB — URINALYSIS, ROUTINE W REFLEX MICROSCOPIC
Bacteria, UA: NONE SEEN
Bilirubin Urine: NEGATIVE
Glucose, UA: >=500 mg/dL — AB
Hgb urine dipstick: NEGATIVE
Ketones, ur: NEGATIVE mg/dL
Leukocytes,Ua: NEGATIVE
Nitrite: NEGATIVE
Protein, ur: 100 mg/dL — AB
Specific Gravity, Urine: 1.018 (ref 1.005–1.030)
pH: 5 (ref 5.0–8.0)

## 2022-01-16 LAB — CBC
HCT: 36.5 % — ABNORMAL LOW (ref 39.0–52.0)
Hemoglobin: 11.5 g/dL — ABNORMAL LOW (ref 13.0–17.0)
MCH: 27.4 pg (ref 26.0–34.0)
MCHC: 31.5 g/dL (ref 30.0–36.0)
MCV: 87.1 fL (ref 80.0–100.0)
Platelets: 367 10*3/uL (ref 150–400)
RBC: 4.19 MIL/uL — ABNORMAL LOW (ref 4.22–5.81)
RDW: 13 % (ref 11.5–15.5)
WBC: 9 10*3/uL (ref 4.0–10.5)
nRBC: 0 % (ref 0.0–0.2)

## 2022-01-16 LAB — BASIC METABOLIC PANEL
Anion gap: 9 (ref 5–15)
BUN: 27 mg/dL — ABNORMAL HIGH (ref 6–20)
CO2: 27 mmol/L (ref 22–32)
Calcium: 8.8 mg/dL — ABNORMAL LOW (ref 8.9–10.3)
Chloride: 104 mmol/L (ref 98–111)
Creatinine, Ser: 1.86 mg/dL — ABNORMAL HIGH (ref 0.61–1.24)
GFR, Estimated: 43 mL/min — ABNORMAL LOW (ref >60)
Glucose, Bld: 90 mg/dL (ref 70–99)
Potassium: 3.4 mmol/L — ABNORMAL LOW (ref 3.5–5.1)
Sodium: 140 mmol/L (ref 135–145)

## 2022-01-16 LAB — HEMOGLOBIN A1C
Hgb A1c MFr Bld: 7 % — ABNORMAL HIGH (ref 4.8–5.6)
Mean Plasma Glucose: 154.2 mg/dL

## 2022-01-16 LAB — URINE DRUG SCREEN, QUALITATIVE (ARMC ONLY)
Amphetamines, Ur Screen: NOT DETECTED
Barbiturates, Ur Screen: NOT DETECTED
Benzodiazepine, Ur Scrn: NOT DETECTED
Cannabinoid 50 Ng, Ur ~~LOC~~: NOT DETECTED
Cocaine Metabolite,Ur ~~LOC~~: NOT DETECTED
MDMA (Ecstasy)Ur Screen: NOT DETECTED
Methadone Scn, Ur: NOT DETECTED
Opiate, Ur Screen: NOT DETECTED
Phencyclidine (PCP) Ur S: NOT DETECTED
Tricyclic, Ur Screen: POSITIVE — AB

## 2022-01-16 MED ORDER — TRAZODONE HCL 50 MG PO TABS: 25.0000 mg | ORAL_TABLET | Freq: Every evening | ORAL | Status: DC | PRN

## 2022-01-16 MED ORDER — MAGNESIUM HYDROXIDE 400 MG/5ML PO SUSP: 30.0000 mL | Freq: Every day | ORAL | Status: DC | PRN

## 2022-01-16 MED ORDER — POTASSIUM CHLORIDE IN NACL 20-0.9 MEQ/L-% IV SOLN
INTRAVENOUS | Status: DC
  Filled 2022-01-16 (×2): qty 1000

## 2022-01-16 MED ORDER — ONDANSETRON HCL 4 MG PO TABS: 4.0000 mg | ORAL_TABLET | Freq: Four times a day (QID) | ORAL | Status: DC | PRN

## 2022-01-16 MED ORDER — INSULIN ASPART 100 UNIT/ML IJ SOLN: 0.0000 [IU] | Freq: Every day | INTRAMUSCULAR | Status: DC

## 2022-01-16 MED ORDER — ACETAMINOPHEN 650 MG RE SUPP: 650.0000 mg | Freq: Four times a day (QID) | RECTAL | Status: DC | PRN

## 2022-01-16 MED ORDER — ACETAMINOPHEN 325 MG PO TABS: 650.0000 mg | ORAL_TABLET | Freq: Four times a day (QID) | ORAL | Status: DC | PRN

## 2022-01-16 MED ORDER — ENOXAPARIN SODIUM 80 MG/0.8ML IJ SOSY: 0.5000 mg/kg | PREFILLED_SYRINGE | INTRAMUSCULAR | Status: DC

## 2022-01-16 MED ORDER — ACETAMINOPHEN 500 MG PO TABS
1000.0000 mg | ORAL_TABLET | Freq: Once | ORAL | Status: AC
  Administered 2022-01-16: 1000 mg via ORAL
  Filled 2022-01-16: qty 2

## 2022-01-16 MED ORDER — ASPIRIN 81 MG PO TBEC
81.0000 mg | DELAYED_RELEASE_TABLET | Freq: Every day | ORAL | Status: DC
  Administered 2022-01-16 – 2022-01-17 (×2): 81 mg via ORAL
  Filled 2022-01-16 (×2): qty 1

## 2022-01-16 MED ORDER — PERFLUTREN LIPID MICROSPHERE
1.0000 mL | INTRAVENOUS | Status: AC | PRN
  Administered 2022-01-16: 3 mL via INTRAVENOUS

## 2022-01-16 MED ORDER — INSULIN ASPART 100 UNIT/ML IJ SOLN: 0.0000 [IU] | Freq: Three times a day (TID) | INTRAMUSCULAR | Status: DC

## 2022-01-16 MED ORDER — CLOPIDOGREL BISULFATE 75 MG PO TABS: 75.0000 mg | ORAL_TABLET | Freq: Every day | ORAL | Status: DC

## 2022-01-16 MED ORDER — ATORVASTATIN CALCIUM 20 MG PO TABS
40.0000 mg | ORAL_TABLET | Freq: Every day | ORAL | Status: DC
  Administered 2022-01-16 – 2022-01-17 (×2): 40 mg via ORAL
  Filled 2022-01-16 (×2): qty 2

## 2022-01-16 MED ORDER — ONDANSETRON HCL 4 MG/2ML IJ SOLN
4.0000 mg | Freq: Four times a day (QID) | INTRAMUSCULAR | Status: DC | PRN
  Administered 2022-01-16 – 2022-01-17 (×2): 4 mg via INTRAVENOUS
  Filled 2022-01-16 (×2): qty 2

## 2022-01-16 MED ORDER — ATORVASTATIN CALCIUM 20 MG PO TABS: 20.0000 mg | ORAL_TABLET | Freq: Every day | ORAL | Status: DC

## 2022-01-16 MED ORDER — STROKE: EARLY STAGES OF RECOVERY BOOK: Freq: Once | Status: AC

## 2022-01-16 NOTE — ED Notes (Signed)
Pt fluids paused and pt taken to MRI and then they will put in transport

## 2022-01-16 NOTE — Progress Notes (Addendum)
SLP Cancellation Note  Patient Details Name: Jeremiah Vance MRN: [id number] DOB: October 21, 1970   Cancelled treatment:       Reason Eval/Treat Not Completed: SLP screened, no needs identified, will sign off (chart reviewed; consulted NSG then met w/ pt during his lunch meal) Per chart review and pt report, pt stated he has had multiple strokes in the past w/ Speech deficits. He was last seen by this service on 10/2021 for a Cognitive-linguistic evaluation post CVA then. Mild Motor Speech deficits were identified-- he is able to recall his strategies of "slowing down" and "take my time to formulate my thoughts into words". OF NOTE: Pt endorsed decreased articulation of speech, speech precision, at his Baseline prior to that admit/event the. See eval on 11/23/2021 in chart.   At this admit, there was concern for mild expressive language deficits -- pt stated he is at his usual Baseline "now" re: his speech. Again, he has Baseline dx'd mild Motor Speech deficits. He implements his strategies(as stated) and answers general questions, makes wants/needs known appropriately, as well as follows commands. Speech is clear, 100% intelligible. He denied any continued speech-language deficits this visit -- exacerbation of old deficits can certainly occur in setting of new illness/stress.  NSG reported his only complaint this morning was "tingling in my fingers" on his L hand.  Pt fed himself some of his Lunch meal w/ no difficulty swallowing; he is currently on a regular diet and tolerates swallowing pills w/ water per NSG.  Recommend pt continue to utilize his Speech strategies as he does and post return home to known setting and post rest and lessening of Acuity of Illness, if pt/Family determine that pt is having any New cognitive-linguistic decline from his norm, then to f/u w/ pt's PCP for referral to skilled outpatient ST services to then address any needs. It is difficult to determine if pt is different from his  Baseline at this time, especially in Acute setting and w/out rest and Family. Pt agreed.  NSG/MD to reconsult if New needs arise during admit.        Orinda Kenner, MS, CCC-SLP Speech Language Pathologist Rehab Services; [place] [phone] (ascom) Rajanee Schuelke 01/16/2022, 2:51 PM

## 2022-01-16 NOTE — Progress Notes (Signed)
ECHO at bedside.

## 2022-01-16 NOTE — Assessment & Plan Note (Signed)
-   We will continue his antihypertensives. 

## 2022-01-16 NOTE — H&P (Signed)
[place]   PATIENT NAME: Jeremiah Vance    MR#:  [id number]  DATE OF BIRTH:  09-29-1970  DATE OF ADMISSION:  01/16/2022  PRIMARY CARE PHYSICIAN: [name], PA-C   Patient is coming from: Home  REQUESTING/REFERRING PHYSICIAN: Ward, Cyril Mourning and, DO  CHIEF COMPLAINT:   Chief Complaint  Patient presents with  . Aphasia    HISTORY OF PRESENT ILLNESS:  Jeremiah Vance is a 51 y.o. male with medical history significant for hemorrhagic CVA in 10/2021, CHF, stage III CKD, dyslipidemia, hypertension and type 2 diabetes mellitus, presented to the emergency room with acute onset of left-sided numbness with left hand weakness that started around 4 PM today with associated slurred speech and mild expressive dysphasia without aphasia his sister to talk to him on the phone about an hour earlier and he had no symptoms then.  He admits to headache without dizziness or blurred vision.  He denies any tinnitus or vertigo.  No weakness seizures.  No chest pain or palpitations.  No urinary or stool incontinence.  No nausea or vomiting or abdominal pain.  No cough or wheezing or dyspnea.  No bleeding diathesis.  No dysuria, oliguria, urinary frequency or urgency, hematuria or flank pain  ED Course: When the patient came to the ER, vital signs were within normal.  Labs revealed a BUN of 25 and creatinine 1.85 similar to previous levels and otherwise unremarkable CMP.  Last CBC showed mild anemia slightly lower than previous levels but very close.  Alcohol level was less than 10 EKG as reviewed by me : EKG showed normal sinus rhythm with a rate of 68 with left axis deviation and lateral T wave inversion. Imaging: Noncontrasted CT scan revealed multiple chronic infarcts and small vessel ischemic disease with no acute intra abnormality.  Brain MRI without contrast showed the following: 1. New acute ischemic infarcts measuring up to 1.6 cm each involving the subcortical left frontal lobe and posterior  right frontal lobe as above. No associated hemorrhage or mass effect. 2. Normal expected evolution of previously seen hemorrhage involving the right external capsule. Previously-seen associated infarct involving the adjacent right corona radiata is now late subacute in appearance. 3. Underlying atrophy with chronic microvascular ischemic disease with multiple additional remote lacunar infarcts. 4. Multiple scattered chronic micro hemorrhages, distribution most consistent with chronic poorly controlled hypertension. 5. Abnormal FLAIR signal intensity involving the posterior aspects of the right greater than left globes. Findings are nonspecific, but could reflect changes of retinal detachment and/or vitreous hemorrhage. Correlation with fundoscopic exam recommended if not already performed. Appearance is stable as compared to prior MRI from 11/23/2021.  The patient was given 1 g of p.o. Tylenol.  He will be admitted to a medical telemetry bed for further evaluation and management. PAST MEDICAL HISTORY:   Past Medical History:  Diagnosis Date  . Acute ischemic stroke ([place]) 09/06/2017  . Acute renal failure superimposed on stage 3a chronic kidney disease ([place]) 07/08/2019  . Acute right-sided weakness   . Allergies   . Anasarca   . Anemia 07/10/2017  . Arthritis   . CHF (congestive heart failure) ([place])    "coinsided w/kidney problems I was having 06/2017"  . Chicken pox   . CKD (chronic kidney disease) stage 3, GFR 30-59 ml/min ([facility]) 06/2017  . Depression   . Elevated troponin 07/08/2019  . High cholesterol   . History of cardiomyopathy    LVEF 40 to 45% in April 2019 - subsequently  normalized  . Hyperbilirubinemia 07/10/2017  . Hypertension   . Ischemic stroke ([place])    Small left internal capsule infarct due to lacunar disease  . Morbid obesity ([place])   . Normocytic anemia 12/16/2017  . 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 Stroke ([place])  08/2017   "right sided weakness since; getting stronger though" (10/22/2017)  . Type 2 diabetes mellitus ([place])     PAST SURGICAL HISTORY:   Past Surgical History:  Procedure Laterality Date  . ABDOMINAL HERNIA REPAIR  2008; 10/22/2017   "scope; OPEN REPAIR INCARCERATED VENTRAL HERNIA  . HERNIA REPAIR    . KNEE ARTHROSCOPY Right 1989  . VENTRAL HERNIA REPAIR N/A 10/22/2017   Procedure: OPEN REPAIR INCARCERATED VENTRAL HERNIA;  Surgeon: Excell Seltzer, MD;  Location: [facility];  Service: General;  Laterality: N/A;    SOCIAL HISTORY:   Social History   Tobacco Use  . Smoking status: Never  . Smokeless tobacco: Never  Substance Use Topics  . Alcohol use: Yes    Comment: occasional beer    FAMILY HISTORY:   Family History  Problem Relation Age of Onset  . Diabetes Mother   . Stroke Mother   . Arthritis Mother   . Depression Mother   . Heart disease Mother   . Hypertension Mother   . Learning disabilities Mother   . Mental illness Mother   . Diabetes Father   . Heart disease Father   . Arthritis Father   . Hearing loss Father   . Hyperlipidemia Father   . Heart attack Father   . Hypertension Father   . Stroke Father   . Diabetes Sister   . Depression Sister   . Diabetes Sister   . Hypertension Sister   . Mental illness Sister   . Diabetes Maternal Grandmother   . Heart disease Maternal Grandmother   . Depression Maternal Grandmother   . Hyperlipidemia Maternal Grandmother   . Hypertension Maternal Grandmother   . Stroke Maternal Grandmother   . Hypertension Maternal Grandfather   . Hyperlipidemia Maternal Grandfather   . Stroke Maternal Grandfather   . Arthritis Paternal Grandmother   . Hearing loss Paternal Grandmother   . Stroke Paternal Grandfather   . Heart disease Paternal Grandfather   . Arthritis Paternal Grandfather   . Heart attack Paternal Grandfather     DRUG ALLERGIES:   Allergies  Allergen Reactions  . Pollen Extract Other (See Comments)   . Sulfa Antibiotics     REVIEW OF SYSTEMS:   ROS As per history of present illness. All pertinent systems were reviewed above. Constitutional, HEENT, cardiovascular, respiratory, GI, GU, musculoskeletal, neuro, psychiatric, endocrine, integumentary and hematologic systems were reviewed and are otherwise negative/unremarkable except for positive findings mentioned above in the HPI.   MEDICATIONS AT HOME:   Prior to Admission medications   Medication Sig Start Date End Date Taking? Authorizing Provider  aspirin EC 81 MG tablet Take 1 tablet (81 mg total) by mouth daily. Swallow whole. 11/26/21  Yes Fritzi Mandes, MD  atorvastatin (LIPITOR) 40 MG tablet Take 1 tablet (40 mg total) by mouth daily at 6 PM. 11/26/21  Yes Fritzi Mandes, MD  carvedilol (COREG) 12.5 MG tablet Take 1 tablet (12.5 mg total) by mouth 2 (two) times daily with a meal. 11/26/21  Yes Fritzi Mandes, MD  dapagliflozin propanediol (FARXIGA) 10 MG TABS tablet Take 1 tablet (10 mg) by mouth once daily 01/08/22  Yes Furth, Cadence H, PA-C  glimepiride (  AMARYL) 4 MG tablet Take 1 tablet (4 mg total) by mouth daily with breakfast. 11/27/21  Yes Fritzi Mandes, MD  hydrALAZINE (APRESOLINE) 50 MG tablet Take 1 tablet (50 mg total) by mouth in the morning and at bedtime. 12/25/21  Yes Furth, Cadence H, PA-C  irbesartan (AVAPRO) 300 MG tablet Take 300 mg by mouth daily.   Yes [provider]  linagliptin (TRADJENTA) 5 MG TABS tablet Take 1 tablet (5 mg total) by mouth daily. 11/27/21  Yes Fritzi Mandes, MD  torsemide (DEMADEX) 20 MG tablet Take 2 tablets (40 mg total) by mouth daily. 11/26/21  Yes Fritzi Mandes, MD  insulin degludec (TRESIBA FLEXTOUCH) 200 UNIT/ML FlexTouch Pen Inject 16 Units into the skin daily. Patient not taking: Reported on 01/09/2022 12/04/21   [name], PA-C      VITAL SIGNS:  Blood pressure (!) 142/75, pulse 60, temperature 98 F (36.7 C), temperature source Oral, resp. rate 18, weight (!) 148.8 kg, SpO2 95  %.  PHYSICAL EXAMINATION:  Physical Exam  GENERAL:  51 y.o.-year-old Caucasian male patient lying in the bed with no acute distress.  EYES: Pupils equal, round, reactive to light and accommodation. No scleral icterus. Extraocular muscles intact.  HEENT: Head atraumatic, normocephalic. Oropharynx and nasopharynx clear.  NECK:  Supple, no jugular venous distention. No thyroid enlargement, no tenderness.  LUNGS: Normal breath sounds bilaterally, no wheezing, rales,rhonchi or crepitation. No use of accessory muscles of respiration.  CARDIOVASCULAR: Regular rate and rhythm, S1, S2 normal. No murmurs, rubs, or gallops.  ABDOMEN: Soft, nondistended, nontender. Bowel sounds present. No organomegaly or mass.  EXTREMITIES: No pedal edema, cyanosis, or clubbing.  NEUROLOGIC: Cranial nerves II through XII are intact.  His speech is much better and close to baseline.  Muscle strength 5/5 in all extremities except in the left upper extremity and specifically in the left hand grip that is 2-3/5 in strength. Sensation intact throughout except on the left arm 1 to sensory exam to light touch is diminished. Gait not checked.  PSYCHIATRIC: The patient is alert and oriented x 3.  Normal affect and good eye contact. SKIN: No obvious rash, lesion, or ulcer.   LABORATORY PANEL:   CBC Recent Labs  Lab 01/15/22 1948  WBC 10.1  HGB 11.8*  HCT 37.3*  PLT 392   ------------------------------------------------------------------------------------------------------------------  Chemistries  Recent Labs  Lab 01/15/22 1948  NA 138  K 3.6  CL 102  CO2 26  GLUCOSE 107*  BUN 25*  CREATININE 1.85*  CALCIUM 8.8*  AST 19  ALT 17  ALKPHOS 71  BILITOT 0.9   ------------------------------------------------------------------------------------------------------------------  Cardiac Enzymes No results for input(s): "TROPONINI" in the last 168  hours. ------------------------------------------------------------------------------------------------------------------  RADIOLOGY:  US Carotid Bilateral  Result Date: 01/16/2022 CLINICAL DATA:  Stroke EXAM: BILATERAL CAROTID DUPLEX ULTRASOUND TECHNIQUE: Pearline Cables scale imaging, color Doppler and duplex ultrasound were performed of bilateral carotid and vertebral arteries in the neck. COMPARISON:  CT head neck angiogram 11/25/2021 FINDINGS: Criteria: Quantification of carotid stenosis is based on velocity parameters that correlate the residual internal carotid diameter with NASCET-based stenosis levels, using the diameter of the distal internal carotid lumen as the denominator for stenosis measurement. The following velocity measurements were obtained: RIGHT ICA: 71 cm/sec CCA: 74 cm/sec SYSTOLIC ICA/CCA RATIO:  1.1 ECA: 112 cm/sec LEFT ICA: 61 cm/sec CCA: 123XX123 cm/sec SYSTOLIC ICA/CCA RATIO:  0.7 ECA: 127 cm/sec RIGHT CAROTID ARTERY: There is a minimal amount of atherosclerotic plaque involving the carotid bulb, not resulting in elevated peak  systolic velocities within the interrogated course of the right internal carotid artery to suggest a hemodynamically significant stenosis. RIGHT VERTEBRAL ARTERY:  Antegrade. LEFT CAROTID ARTERY: There is a minimal amount of atherosclerotic plaque involving the carotid bulb, not resulting in elevated peak systolic velocities within the interrogated course of the left internal carotid artery to suggest a hemodynamically significant stenosis. LEFT VERTEBRAL ARTERY:  Antegrade. Upper extremity blood pressures: RIGHT: Not acquired LEFT: Not acquired IMPRESSION: No hemodynamically significant stenosis. Electronically Signed   By: Placido Sou M.D.   On: 01/16/2022 04:03   MR BRAIN WO CONTRAST  Result Date: 01/16/2022 CLINICAL DATA:  Initial evaluation for neuro deficit, stroke suspected. EXAM: MRI HEAD WITHOUT CONTRAST TECHNIQUE: Multiplanar, multiecho pulse sequences of  the brain and surrounding structures were obtained without intravenous contrast. COMPARISON:  Prior CT from 01/15/2022 and MRI from 11/23/2021. FINDINGS: Brain: Cerebral volume within normal limits. Patchy and confluent T2/FLAIR hyperintensity involving the periventricular deep white matter both cerebral hemispheres as well as the pons, most consistent with chronic small vessel ischemic disease, advanced for age. Multiple scattered remote lacunar infarcts present about the hemispheric cerebral white matter, deep gray nuclei, and pons. There has been interval evolution of previously seen hemorrhage involving the right external capsule. Previously-seen associated infarct involving the adjacent right corona radiata is now late subacute in appearance measuring up to 1.3 cm (series 5, image 29). New acute ischemic infarcts measuring up to 1.6 cm are seen involving the subcortical left frontal lobe (series 5, image 34) as well as the subcortical posterior right frontal region (series 5, image 32). No associated hemorrhage or mass effect. No other evidence for new or interval infarction. No acute intracranial hemorrhage. Multiple additional scattered chronic micro hemorrhages noted, most consistent with chronic poorly controlled hypertension. No mass lesion, midline shift or mass effect. No hydrocephalus or extra-axial fluid collection. Pituitary gland and suprasellar region within normal limits. Vascular: Major intracranial vascular flow voids are maintained. Skull and upper cervical spine: Craniocervical junction within normal limits. Bone marrow signal intensity normal. No scalp soft tissue abnormality. Sinuses/Orbits: Abnormal FLAIR signal intensity again noted at the posterior margins of the right greater than left globes (series 15, images 17, 15), indeterminate, but could reflect sequelae of right the attachment and/or vitreous hemorrhage. Appearance is similar to prior. Globes orbital soft tissues demonstrate no  other acute finding. Mild scattered mucosal thickening noted about the ethmoidal air cells. Paranasal sinuses are otherwise clear. No significant mastoid effusion. Other: None. IMPRESSION: 1. New acute ischemic infarcts measuring up to 1.6 cm each involving the subcortical left frontal lobe and posterior right frontal lobe as above. No associated hemorrhage or mass effect. 2. Normal expected evolution of previously seen hemorrhage involving the right external capsule. Previously-seen associated infarct involving the adjacent right corona radiata is now late subacute in appearance. 3. Underlying atrophy with chronic microvascular ischemic disease with multiple additional remote lacunar infarcts. 4. Multiple scattered chronic micro hemorrhages, distribution most consistent with chronic poorly controlled hypertension. 5. Abnormal FLAIR signal intensity involving the posterior aspects of the right greater than left globes. Findings are nonspecific, but could reflect changes of retinal detachment and/or vitreous hemorrhage. Correlation with fundoscopic exam recommended if not already performed. Appearance is stable as compared to prior MRI from 11/23/2021. Electronically Signed   By: Jeannine Boga M.D.   On: 01/16/2022 01:07   CT HEAD WO CONTRAST  Result Date: 01/15/2022 CLINICAL DATA:  Neuro deficit acute stroke suspected; numbness EXAM: CT HEAD WITHOUT CONTRAST TECHNIQUE:  Contiguous axial images were obtained from the base of the skull through the vertex without intravenous contrast. RADIATION DOSE REDUCTION: This exam was performed according to the departmental dose-optimization program which includes automated exposure control, adjustment of the mA and/or kV according to patient size and/or use of iterative reconstruction technique. COMPARISON:  CT head neck angiogram 11/25/2021 FINDINGS: Brain: No intracranial hemorrhage, mass effect, or evidence of acute infarct. No hydrocephalus. No extra-axial fluid  collection. Generalized cerebral atrophy. Ill-defined hypoattenuation within the cerebral white matter is nonspecific but consistent with chronic small vessel ischemic disease. The previous intraparenchymal hemorrhage in the right basal ganglia has resolved. Chronic lacunar infarcts right centrum semiovale and corona radiata as well as left basal ganglia. Vascular: No hyperdense vessel. Intracranial arterial calcification. Skull: No fracture or focal lesion. Sinuses/Orbits: No acute finding. Paranasal sinuses and mastoid air cells are well aerated. Other: None. IMPRESSION: No acute intracranial abnormality. Multiple chronic infarcts and small-vessel ischemic disease. Electronically Signed   By: Placido Sou M.D.   On: 01/15/2022 20:09      IMPRESSION AND PLAN:  Assessment and Plan: * Acute CVA (cerebrovascular accident) ([place]) - This is  bilateral frontal lobe ischemic infarction concerning for embolic CVA. - This is manifested by left-sided numbness and left upper extremity weakness. - The patient will be admitted to a medical telemetry bed. - We will follow neurochecks every 4 hours for 24 hours. - 2D echo with bubble study as well as bilateral carotid Doppler will be obtained. - We will keep the patient on baby aspirin for now and defer adding Plavix to the morning neurologist discretion give scattered multiple microhemorrhages consistent with chronic poorly controlled hypertension seen on brain MRI and history of recent hemorrhagic stroke less than 2 months ago. - Neurology, PT/OT and ST consults will be obtained. - I notified Dr. Rory Percy about the patient.  Chronic diastolic CHF (congestive heart failure) ([facility]) - We will continue Demadex, beta-blocker therapy, ARB therapy and Farxiga.  Dyslipidemia - We will continue statin therapy and check fasting lipids in AM.  Type 2 diabetes mellitus without complication, with long-term current use of insulin ([facility]) - We will continue [place],  Tradjenta and Amaryl. - We will continue basal coverage. - He will be placed on supplement coverage with NovoLog.  Essential hypertension - We will continue his antihypertensives.   DVT prophylaxis: SCDs. Advanced Care Planning:  Code Status: full code. Family Communication:  The plan of care was discussed in details with the patient (and family). I answered all questions. The patient agreed to proceed with the above mentioned plan. Further management will depend upon hospital course. Disposition Plan: Back to previous home environment Consults called: Neurology.   All the records are reviewed and case discussed with ED provider.  Status is: Inpatient  At the time of the admission, it appears that the appropriate admission status for this patient is inpatient.  This is judged to be reasonable and necessary in order to provide the required intensity of service to ensure the patient's safety given the presenting symptoms, physical exam findings and initial radiographic and laboratory data in the context of comorbid conditions.  The patient requires inpatient status due to high intensity of service, high risk of further deterioration and high frequency of surveillance required.  I certify that at the time of admission, it is my clinical judgment that the patient will require inpatient hospital care extending more than 2 midnights.  Dispo: The patient is from: Home              Anticipated d/c is to: Home              Patient currently is not medically stable to d/c.              Difficult to place patient: No  Christel Mormon M.D on 01/16/2022 at 4:40 AM  [facility]   From 7 PM-7 AM, contact night-coverage [email or web address]  CC: Primary care physician; [name], PA-C

## 2022-01-16 NOTE — Evaluation (Signed)
Occupational Therapy Evaluation Patient Details Name: Jeremiah Vance MRN: [id number] DOB: 06-24-1970 Today's Date: 01/16/2022   History of Present Illness Jeremiah Vance is a 51 y.o. male with medical history significant for hemorrhagic CVA in 10/2021, CHF, stage III CKD, dyslipidemia, hypertension and type 2 diabetes mellitus, presented to the emergency room with acute onset of left-sided numbness with left hand weakness that started around 4 PM today with associated slurred speech and mild expressive dysphasia without aphasia. MRI brain: New acute ischemic infarcts measuring up to 1.6 cm each involving the subcortical left frontal lobe and posterior right frontal lobe  as above. No associated hemorrhage or mass effect.   Clinical Impression   Pt agreeable to OT/PT co-evaluation to maximize safety and participation. +2 this date for safety and line/lead management. Patient presenting with L sided weakness, impaired ROM, coordination, endurance, and balance impacting safety and independence in ADLs. At baseline, pt is independent in ADLs, IADLs, and functional mobility with occasional RW use. Patient currently functioning at supervision for bed mobility, Min guard +2 for toilet transfer/functional mobility to the bathroom, and Min A for clothing management. Patient will benefit from acute OT to increase overall independence in the areas of ADLs and functional mobility in order to safely discharge home. Pt could benefit from [facility] following D/C to decrease falls risk, improve balance, and maximize independence in self-care within own home environment.      Recommendations for follow up therapy are one component of a multi-disciplinary discharge planning process, led by the attending physician.  Recommendations may be updated based on patient status, additional functional criteria and insurance authorization.   Follow Up Recommendations  Home health OT    Assistance Recommended at Discharge Set up  Supervision/Assistance  Patient can return home with the following A little help with walking and/or transfers;A little help with bathing/dressing/bathroom;Assistance with cooking/housework;Assist for transportation;Help with stairs or ramp for entrance    Functional Status Assessment  Patient has had a recent decline in their functional status and demonstrates the ability to make significant improvements in function in a reasonable and predictable amount of time.  Equipment Recommendations  None recommended by OT    Recommendations for Other Services       Precautions / Restrictions Precautions Precautions: Fall Restrictions Weight Bearing Restrictions: No      Mobility Bed Mobility Overal bed mobility: Needs Assistance Bed Mobility: Supine to Sit, Sit to Supine     Supine to sit: HOB elevated, Supervision Sit to supine: Supervision        Transfers Overall transfer level: Needs assistance Equipment used: Rolling walker (2 wheels) (bariatric RW) Transfers: Sit to/from Stand Sit to Stand: Min guard, +2 safety/equipment           General transfer comment: first trial STS with Min guard (dizziness upon standing, unsteadiness, holding onto bed rail for balance), second trial STS with Min guard +2 for safety using bariatric RW      Balance Overall balance assessment: Needs assistance Sitting-balance support: Feet supported Sitting balance-Leahy Scale: Good     Standing balance support: Bilateral upper extremity supported, During functional activity Standing balance-Leahy Scale: Fair                             ADL either performed or assessed with clinical judgement   ADL Overall ADL's : Needs assistance/impaired  Lower Body Dressing: Minimal assistance;Sitting/lateral leans   Toilet Transfer: Min guard;+2 for safety/equipment;Regular Toilet;Grab bars;Rolling walker (2 wheels) Toilet Transfer Details (indicate cue type  and reason): Min guard for controlled descent onto toilet 2/2 low surface, bariatric RW Toileting- Clothing Manipulation and Hygiene: Minimal assistance;Sit to/from stand;Min guard;Sitting/lateral lean Toileting - Clothing Manipulation Details (indicate cue type and reason): Min A to reach boxers after falling around ankles, Min guard for hiking boxers over B hips in standing     Functional mobility during ADLs: Rolling walker (2 wheels);+2 for safety/equipment;Min guard (Min A for RW management)       Vision Baseline Vision/History: 1 Wears glasses;2 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) Patient Visual Report: No change from baseline Vision Assessment?: Yes Eye Alignment: Within Functional Limits Ocular Range of Motion: Within Functional Limits Alignment/Gaze Preference: Within Defined Limits Tracking/Visual Pursuits: Decreased smoothness of horizontal tracking;Decreased smoothness of vertical tracking;Requires cues, head turns, or add eye shifts to track (tested for R eye, eye jumping away from target noted) Depth Perception: Undershoots Additional Comments: undershooting noted with LUE finger to nose testing     Perception     Praxis      Pertinent Vitals/Pain Pain Assessment Pain Assessment: No/denies pain     Hand Dominance Right   Extremity/Trunk Assessment Upper Extremity Assessment Upper Extremity Assessment: LUE deficits/detail (RUE ROM/strength WNL) LUE Deficits / Details: L sided weakness with acute CVA, pt reports residual weakness from prior strokes as well (first one in 2019), still having numbness in hand (specifically digits 3-5), shoulder flexion 1/4 full ROM, poor grip strength   Lower Extremity Assessment Lower Extremity Assessment: Generalized weakness   Cervical / Trunk Assessment Cervical / Trunk Assessment: Normal   Communication Communication Communication: No difficulties   Cognition Arousal/Alertness: Awake/alert Behavior During Therapy: WFL  for tasks assessed/performed Overall Cognitive Status: Within Functional Limits for tasks assessed                                       General Comments  pt reporting mild dizziness upon standing, BP 163/94 (MAP 106) in standing    Exercises Other Exercises Other Exercises: OT provided education re: role of OT, OT POC, post acute recs, sitting up for all meals, EOB/OOB mobility with assistance, home/fall safety.     Shoulder Instructions      Home Living Family/patient expects to be discharged to:: Private residence Living Arrangements: Other relatives (sister) Available Help at Discharge: Available PRN/intermittently;Family Type of Home:  (townhouse) Home Access:  (flat entrance at front)     Home Layout: Two level;Bed/bath upstairs Alternate Level Stairs-Number of Steps: flight Alternate Level Stairs-Rails: Right Bathroom Shower/Tub: Occupational psychologist: Standard     Home Equipment: Shower seat - built Medical sales representative (2 wheels)   Additional Comments: reports can sleep in recliner on 1st floor if needed      Prior Functioning/Environment Prior Level of Function : Independent/Modified Independent;Working/employed;History of Falls (last six months)             Mobility Comments: Pt is independent without AD for household distances, uses RW for community distances, one fall past 6 months ADLs Comments: Pt reports being independent with ADLs. Sister provides transportation, works as Tourist information centre manager at [employer].        OT Problem List: Impaired vision/perception;Decreased range of motion;Decreased coordination;Decreased strength;Decreased activity tolerance;Impaired balance (sitting and/or standing);Impaired sensation;Impaired UE functional  use      OT Treatment/Interventions: Self-care/ADL training;Therapeutic exercise;Patient/family education;Balance training;Energy conservation;Therapeutic activities;DME and/or AE instruction    OT  Goals(Current goals can be found in the care plan section) Acute Rehab OT Goals Patient Stated Goal: to go home OT Goal Formulation: With patient Time For Goal Achievement: 01/30/22 Potential to Achieve Goals: Good ADL Goals Pt Will Perform Grooming: with modified independence;standing Pt Will Perform Upper Body Dressing: with modified independence;sitting Pt Will Perform Lower Body Dressing: with modified independence;sitting/lateral leans;sit to/from stand Pt Will Transfer to Toilet: with modified independence;ambulating;regular height toilet;grab bars Pt Will Perform Toileting - Clothing Manipulation and hygiene: with modified independence;sit to/from stand  OT Frequency: Min 2X/week    Co-evaluation PT/OT/SLP Co-Evaluation/Treatment: Yes Reason for Co-Treatment: For patient/therapist safety;To address functional/ADL transfers PT goals addressed during session: Mobility/safety with mobility;Balance;Proper use of DME OT goals addressed during session: ADL's and self-care      AM-PAC OT "6 Clicks" Daily Activity     Outcome Measure Help from another person eating meals?: A Little Help from another person taking care of personal grooming?: A Little Help from another person toileting, which includes using toliet, bedpan, or urinal?: A Little Help from another person bathing (including washing, rinsing, drying)?: A Little Help from another person to put on and taking off regular upper body clothing?: A Little Help from another person to put on and taking off regular lower body clothing?: A Little 6 Click Score: 18   End of Session Equipment Utilized During Treatment: Gait belt;Rolling walker (2 wheels) (bariatric RW) Nurse Communication: Mobility status  Activity Tolerance: Patient tolerated treatment well Patient left: in bed;with call bell/phone within reach;Other (comment) (RN and MRI tech present in room to take pt for imaging)  OT Visit Diagnosis: Unsteadiness on feet  (R26.81);History of falling (Z91.81);Muscle weakness (generalized) (M62.81)                Time: [id number] OT Time Calculation (min): 33 min Charges:  OT General Charges $OT Visit: 1 Visit OT Evaluation $OT Eval Moderate Complexity: 1 Mod  [facility] MS, OTR/L ascom [phone]  01/16/22, 12:15 PM

## 2022-01-16 NOTE — Assessment & Plan Note (Addendum)
-   We will continue Demadex, beta-blocker therapy, ARB therapy and Farxiga.

## 2022-01-16 NOTE — Consult Note (Signed)
Neurology Consultation  Reason for Consult: Stroke Referring Physician:  Mansy  CC: Speech difficulties  History is obtained from: Patient, chart  HPI: Jeremiah Vance is a 51 y.o. 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  No clear last known well was discernible-Per the ER notes, it was 6:30 AM on 01/15/2022 presumably his family was trying to reach him but he would not respond and when they checked on him, he appeared confused and was unable to talk normally.  He also reports worsening of the left arm weakness which has been present since the last stroke in August. He is compliant with his aspirin. Denies any recent illnesses or sicknesses   LKW: Morning of 01/15/2022 around 6:30 AM when family saw him and then found him later at 4 PM symptomatic. IV thrombolysis given?: no, outside the window Premorbid modified Rankin scale (mRS):3-4   ROS: Full ROS was performed and is negative except as noted in the HPI.   Past Medical History:  Diagnosis Date   Acute ischemic stroke ([place]) 09/06/2017   Acute renal failure superimposed on stage 3a chronic kidney disease ([place]) 07/08/2019   Acute right-sided weakness    Allergies    Anasarca    Anemia 07/10/2017   Arthritis    CHF (congestive heart failure) ([place])    "coinsided w/kidney problems I was having 06/2017"   Chicken pox    CKD (chronic kidney disease) stage 3, GFR 30-59 ml/min ([place]) 06/2017   Depression    Elevated troponin 07/08/2019   High cholesterol    History of cardiomyopathy    LVEF 40 to 45% in April 2019 - subsequently normalized   Hyperbilirubinemia 07/10/2017   Hypertension    Ischemic stroke ([place])    Small left internal  capsule infarct due to lacunar disease   Morbid obesity ([place])    Normocytic anemia 12/16/2017   Recurrent incisional hernia with incarceration s/p repair 10/22/2017 10/21/2017   Stroke ([place]) 08/2017   "right sided weakness since; getting stronger though" (10/22/2017)   Type 2 diabetes mellitus ([place])      Family History  Problem Relation Age of Onset   Diabetes Mother    Stroke Mother    Arthritis Mother    Depression Mother    Heart disease Mother    Hypertension Mother    Learning disabilities Mother    Mental illness Mother    Diabetes Father    Heart disease Father    Arthritis Father    Hearing loss Father    Hyperlipidemia Father    Heart attack Father    Hypertension Father    Stroke Father    Diabetes Sister    Depression Sister    Diabetes Sister    Hypertension Sister    Mental illness Sister    Diabetes Maternal Grandmother    Heart disease Maternal Grandmother    Depression Maternal Grandmother    Hyperlipidemia Maternal Grandmother    Hypertension Maternal Grandmother    Stroke Maternal Grandmother    Hypertension Maternal Grandfather    Hyperlipidemia Maternal Grandfather    Stroke Maternal Grandfather    Arthritis Paternal Grandmother    Hearing loss Paternal Grandmother    Stroke Paternal Grandfather    Heart disease  Paternal Grandfather    Arthritis Paternal Grandfather    Heart attack Paternal Grandfather      Social History:   reports that he has never smoked. He has never used smokeless tobacco. He reports current alcohol use. He reports that he does not use drugs.  Medications  Current Facility-Administered Medications:    [START ON 01/17/2022]  stroke: early stages of recovery book, , Does not apply, Once, Mansy, Jan A, MD   0.9 % NaCl with KCl 20 mEq/ L  infusion, , Intravenous, Continuous, Mansy, Jan A, MD, Last Rate: 100 mL/hr at 01/16/22 0531, New Bag at 01/16/22 0531   acetaminophen (TYLENOL) tablet 650 mg, 650 mg, Oral, Q6H PRN **OR**  acetaminophen (TYLENOL) suppository 650 mg, 650 mg, Rectal, Q6H PRN, Mansy, Jan A, MD   aspirin EC tablet 81 mg, 81 mg, Oral, Daily, Mansy, Jan A, MD   atorvastatin (LIPITOR) tablet 20 mg, 20 mg, Oral, Daily, Mansy, Jan A, MD   magnesium hydroxide (MILK OF MAGNESIA) suspension 30 mL, 30 mL, Oral, Daily PRN, Mansy, Jan A, MD   ondansetron (ZOFRAN) tablet 4 mg, 4 mg, Oral, Q6H PRN **OR** ondansetron (ZOFRAN) injection 4 mg, 4 mg, Intravenous, Q6H PRN, Mansy, Jan A, MD   sodium chloride flush (NS) 0.9 % injection 3 mL, 3 mL, Intravenous, Once, Ward, Kristen N, DO   traZODone (DESYREL) tablet 25 mg, 25 mg, Oral, QHS PRN, Mansy, Jan A, MD  Current Outpatient Medications:    aspirin EC 81 MG tablet, Take 1 tablet (81 mg total) by mouth daily. Swallow whole., Disp: 30 tablet, Rfl: 12   atorvastatin (LIPITOR) 40 MG tablet, Take 1 tablet (40 mg total) by mouth daily at 6 PM., Disp: 90 tablet, Rfl: 3   carvedilol (COREG) 12.5 MG tablet, Take 1 tablet (12.5 mg total) by mouth 2 (two) times daily with a meal., Disp: 60 tablet, Rfl: 3   dapagliflozin propanediol (FARXIGA) 10 MG TABS tablet, Take 1 tablet (10 mg) by mouth once daily, Disp: 30 tablet, Rfl: 6   glimepiride (AMARYL) 4 MG tablet, Take 1 tablet (4 mg total) by mouth daily with breakfast., Disp: 30 tablet, Rfl: 3   hydrALAZINE (APRESOLINE) 50 MG tablet, Take 1 tablet (50 mg total) by mouth in the morning and at bedtime., Disp: 60 tablet, Rfl: 5   irbesartan (AVAPRO) 300 MG tablet, Take 300 mg by mouth daily., Disp: , Rfl:    linagliptin (TRADJENTA) 5 MG TABS tablet, Take 1 tablet (5 mg total) by mouth daily., Disp: 30 tablet, Rfl: 3   torsemide (DEMADEX) 20 MG tablet, Take 2 tablets (40 mg total) by mouth daily., Disp: 90 tablet, Rfl: 3   insulin degludec (TRESIBA FLEXTOUCH) 200 UNIT/ML FlexTouch Pen, Inject 16 Units into the skin daily. (Patient not taking: Reported on 01/09/2022), Disp: 3 mL, Rfl: 3   Exam: Current vital signs: BP (!) 149/76    Pulse (!) 58   Temp 98 F (36.7 C)   Resp (!) 22   Wt (!) 148.8 kg   SpO2 95%   BMI 47.06 kg/m  Vital signs in last 24 hours: Temp:  [98 F (36.7 C)-98.3 F (36.8 C)] 98 F (36.7 C) (10/18 1000) Pulse Rate:  [54-69] 58 (10/18 1000) Resp:  [15-22] 22 (10/18 1000) BP: (134-158)/(73-84) 149/76 (10/18 1000) SpO2:  [94 %-99 %] 95 % (10/18 1000) Weight:  [148.8 kg] 148.8 kg (10/17 1938) General: Awake alert oriented x3 HEENT: Normocephalic atraumatic Lungs: Clear Prevascular: Regular rhythm Abdomen nondistended  nontender Extremities with cellulitic changes and edema bilaterally Neurological exam Awake alert oriented x3 Mildly dysarthric No evidence of aphasia Cranial nerves: He is blind in the left eye with daily light perception, right pupil is round and reactive, left is round and barely reactive to light, he has difficulty initiating extraocular movements in terms of following a finger but is able to spontaneously look to the left and right without any restriction, also reports difficulty focusing on fingers that are in front of him due to poor acuity, difficult to assess peripheral vision, facial sensation intact, face is grossly symmetric. Motor examination with left upper extremity weakness with drift-at best 4 -/5 strength proximally and 2-3/5 strength distally.  Right upper extremities..  Both lower extremities are barely antigravity. Sensation: Intact Coordination with no dysmetria in the right upper extremity, difficult to perform the left upper extremity. NIHSS 1a Level of Conscious.: 0 1b LOC Questions: 0 1c LOC Commands: 0 2 Best Gaze: 0 3 Visual: 0 4 Facial Palsy: 0 5a Motor Arm - left: 2 5b Motor Arm - Right: 0 6a Motor Leg - Left: 2 6b Motor Leg - Right: 2 7 Limb Ataxia: 0 8 Sensory: 0 9 Best Language: 0 10 Dysarthria: 1 11 Extinct. and Inatten.: 0 TOTAL: 7   Labs I have reviewed labs in epic and the results pertinent to this consultation are:  CBC     Component Value Date/Time   WBC 9.0 01/16/2022 0518   RBC 4.19 (L) 01/16/2022 0518   HGB 11.5 (L) 01/16/2022 0518   HGB 15.1 05/19/2020 1139   HCT 36.5 (L) 01/16/2022 0518   HCT 47.1 05/19/2020 1139   PLT 367 01/16/2022 0518   PLT 280 05/19/2020 1139   MCV 87.1 01/16/2022 0518   MCV 87 05/19/2020 1139   MCH 27.4 01/16/2022 0518   MCHC 31.5 01/16/2022 0518   RDW 13.0 01/16/2022 0518   RDW 12.4 05/19/2020 1139   LYMPHSABS 2.4 01/15/2022 1948   LYMPHSABS 2.1 05/19/2020 1139   MONOABS 0.8 01/15/2022 1948   EOSABS 0.3 01/15/2022 1948   EOSABS 0.3 05/19/2020 1139   BASOSABS 0.1 01/15/2022 1948   BASOSABS 0.1 05/19/2020 1139    CMP     Component Value Date/Time   NA 140 01/16/2022 0518   NA 137 11/28/2021 1520   K 3.4 (L) 01/16/2022 0518   CL 104 01/16/2022 0518   CO2 27 01/16/2022 0518   GLUCOSE 90 01/16/2022 0518   BUN 27 (H) 01/16/2022 0518   BUN 36 (H) 11/28/2021 1520   CREATININE 1.86 (H) 01/16/2022 0518   CALCIUM 8.8 (L) 01/16/2022 0518   PROT 7.8 01/15/2022 1948   PROT 6.4 05/19/2020 1139   ALBUMIN 3.5 01/15/2022 1948   ALBUMIN 3.6 (L) 05/19/2020 1139   AST 19 01/15/2022 1948   ALT 17 01/15/2022 1948   ALKPHOS 71 01/15/2022 1948   BILITOT 0.9 01/15/2022 1948   BILITOT 0.6 05/19/2020 1139   GFRNONAA 43 (L) 01/16/2022 0518   GFRAA 72 05/19/2020 1139    Lipid Panel     Component Value Date/Time   CHOL 106 01/16/2022 0518   CHOL 154 05/19/2020 1139   TRIG 104 01/16/2022 0518   HDL 24 (L) 01/16/2022 0518   HDL 39 (L) 05/19/2020 1139   CHOLHDL 4.4 01/16/2022 0518   VLDL 21 01/16/2022 0518   LDLCALC 61 01/16/2022 0518   LDLCALC 100 (H) 05/19/2020 1139  A1c 10.5 in August 2023 Lipid panel with LDL cholesterol 61  2D  echocardiogram in August revealed an ejection fraction of 30 to 35% with global hypokinesis of the left ventricle along with grade 1 diastolic dysfunction.  Right ventricular systolic function also mildly reduced.  Left atrial size was moderately  dilated.  Imaging I have reviewed the images obtained:  CT-head: No acute intracranial abnormality.  Multiple chronic infarcts and small vessel disease.  MRI examination of the brain: New acute ischemic infarcts up to 1.6 cm each involving the subcortical left frontal and posterior right frontal lobe.  No associated hemorrhage or mass effect.  Normal expected evolution of the previously seen hemorrhage involving the right external capsule/basal ganglia.  Previously seen associated infarct involving the adjacent right corona radiata is now late subacute in appearance.  Underlying atrophy with chronic microvascular ischemic disease with multiple additional remote lacunar infarctions.  Multiple scattered chronic microhemorrhages distribution most consistent with chronic poorly controlled hypertension.  Abnormal FLAIR signal involves posterior aspects of right greater than left globes.  Nonspecific findings but could reflect changes of retinal detachment or vitreous hemorrhage.  Correlation with funduscopic examination recommended.  Assessment:  51 year old man with extensive cerebrovascular risk factors including diabetes, hypertension, hyperlipidemia, prior ischemic strokes and a recent basal ganglia ICH on the right side with residual left hemiparesis presenting with speech difficulties and worsening left-sided weakness, noted to have bifrontal ischemic strokes that are acute and new from the prior imaging of Eliquis. He has had multiple ischemic strokes in the past and an ICH, likely due to hypertensive etiology in August. Etiology of these new strokes is likely concomitant small vessel disease but a cardiac source should also be evaluated.  Impression: Acute ischemic stroke-likely concomitant small vessel etiology.  Evaluate for cardioembolic source.  Recommendations: Admit to hospitalist Frequent neurochecks 2D echo Would have preferred a CT angiography of the head and neck but he has already  had carotid Dopplers done.  I will get brain vessel imaging in the form of MRA head without contrast Most recent A1c greater than 7.  Medical management of diabetes for goal A1c less than 7.  No need to repeat A1c as one was done less than 2 months ago. LDL at goal with atorvastatin 40.  Continue atorvastatin 40. Aspirin 81 only given recent ICH. Permissive hypertension for a day or so and then start normalizing blood pressures with a goal blood pressure on discharge of 140/90 or below. PT OT Speech therapy Although my suspicion is that his strokes are likely a result of uncontrolled risk factors such as diabetes, hypertension hyperlipidemia rather than being cardioembolic-MRI appearance can be consistent with cardioembolic strokes as well and he has left atrial dilatation on his last echocardiogram, which can be seen in paroxysmal A-fib-for which reason I would recommend 30-day outpatient cardiac monitoring at discharge.  Plan was relayed to Dr. Tana Coast  -- Amie Portland, MD Neurologist [facility] Pager: [phone]

## 2022-01-16 NOTE — Evaluation (Signed)
Physical Therapy Evaluation Patient Details Name: Jeremiah Vance MRN: [id number] DOB: 08-07-1970 Today's Date: 01/16/2022  History of Present Illness  Pt is a 51 y.o. male with medical history significant for hemorrhagic CVA in 10/2021, CHF, stage III CKD, dyslipidemia, hypertension and type 2 diabetes mellitus,  lymphedema, R sided residual deficits from 2019, presented to the emergency room with acute onset of left-sided numbness with left hand weakness. Workup showed "New acute ischemic infarcts measuring up to 1.6 cm each involving the subcortical left frontal lobe and posterior right frontal lobe".   Clinical Impression  Patient alert, sitting with OT at bedside, seen as a co-evaluation to maximize safety with mobility. Per pt at baseline ambulatory in home without AD, does use his walker for longer distances, lives with his sister.  The patient was able to perform sit <> stand with CGAx2 and RW. Endorsed feeling dizzy, BP HR and SpO2 all WFLs. Pt able to ambulate ~73ft total with RW and CGAx2 (lines/leads). He was able to sit <> stand from low commode with use of grab bars as well without physical assistance. Returned to bed due to arrival of  transport.  Overall the patient demonstrated deficits (see "PT Problem List") that impede the patient's functional abilities, safety, and mobility and would benefit from skilled PT intervention. Recommendation is HHPT with intermittent supervision/assistance to maximize safety and function.        Recommendations for follow up therapy are one component of a multi-disciplinary discharge planning process, led by the attending physician.  Recommendations may be updated based on patient status, additional functional criteria and insurance authorization.  Follow Up Recommendations Home health PT      Assistance Recommended at Discharge Intermittent Supervision/Assistance  Patient can return home with the following  Assistance with  cooking/housework;Assist for transportation;Help with stairs or ramp for entrance;Direct supervision/assist for medications management    Equipment Recommendations BSC/3in1  Recommendations for Other Services       Functional Status Assessment Patient has had a recent decline in their functional status and demonstrates the ability to make significant improvements in function in a reasonable and predictable amount of time.     Precautions / Restrictions Precautions Precautions: Fall Restrictions Weight Bearing Restrictions: No      Mobility  Bed Mobility Overal bed mobility: Needs Assistance         Sit to supine: Supervision   General bed mobility comments: sitting EOB with OT upon PT entrance    Transfers Overall transfer level: Needs assistance Equipment used: Rolling walker (2 wheels) (bariatric) Transfers: Sit to/from Stand Sit to Stand: Min guard, +2 safety/equipment           General transfer comment: first trial STS with Min guard (dizziness upon standing, unsteadiness, holding onto bed rail for balance), second trial STS with Min guard +2 for safety using bariatric RW    Ambulation/Gait   Gait Distance (Feet): 30 Feet Assistive device: Rolling walker (2 wheels)         General Gait Details: wide BOS, no true LOB  Stairs            Wheelchair Mobility    Modified Rankin (Stroke Patients Only)       Balance Overall balance assessment: Needs assistance Sitting-balance support: Feet supported Sitting balance-Leahy Scale: Good     Standing balance support: Bilateral upper extremity supported, During functional activity Standing balance-Leahy Scale: Fair  Pertinent Vitals/Pain Pain Assessment Pain Assessment: No/denies pain    Home Living Family/patient expects to be discharged to:: Private residence Living Arrangements: Other relatives (sister) Available Help at Discharge: Available  PRN/intermittently;Family Type of Home:  (townhouse) Home Access:  (flat entrance at front)     Alternate Level Stairs-Number of Steps: flight Home Layout: Two level;Bed/bath upstairs Home Equipment: Shower seat - built Medical sales representative (2 wheels) Additional Comments: reports can sleep in recliner on 1st floor if needed    Prior Function Prior Level of Function : Independent/Modified Independent;Working/employed;History of Falls (last six months)             Mobility Comments: Pt is independent without AD for household distances, uses RW for community distances, one fall past 6 months ADLs Comments: Pt reports being independent with ADLs. Sister provides transportation, works as Tourist information centre manager at [employer].     Hand Dominance   Dominant Hand: Right    Extremity/Trunk Assessment   Upper Extremity Assessment Upper Extremity Assessment: Defer to OT evaluation LUE Deficits / Details: L sided weakness with acute CVA, pt reports residual weakness from prior strokes as well (first one in 2019), still having numbness in hand (specifically digits 3-5), shoulder flexion 1/4 full ROM, poor grip strength    Lower Extremity Assessment Lower Extremity Assessment: Generalized weakness    Cervical / Trunk Assessment Cervical / Trunk Assessment: Normal  Communication   Communication: No difficulties  Cognition Arousal/Alertness: Awake/alert Behavior During Therapy: WFL for tasks assessed/performed Overall Cognitive Status: Within Functional Limits for tasks assessed                                          General Comments General comments (skin integrity, edema, etc.): pt reporting mild dizziness upon standing, BP 163/94 (MAP 106) in standing    Exercises     Assessment/Plan    PT Assessment Patient needs continued PT services  PT Problem List Decreased strength;Decreased mobility;Decreased activity tolerance;Decreased balance;Decreased knowledge of use of DME        PT Treatment Interventions DME instruction;Therapeutic exercise;Balance training;Gait training;Stair training;Neuromuscular re-education;Functional mobility training;Therapeutic activities;Patient/family education    PT Goals (Current goals can be found in the Care Plan section)  Acute Rehab PT Goals Patient Stated Goal: to go home PT Goal Formulation: With patient Time For Goal Achievement: 01/30/22 Potential to Achieve Goals: Good    Frequency 7X/week     Co-evaluation PT/OT/SLP Co-Evaluation/Treatment: Yes Reason for Co-Treatment: For patient/therapist safety;Necessary to address cognition/behavior during functional activity PT goals addressed during session: Mobility/safety with mobility;Balance;Proper use of DME OT goals addressed during session: ADL's and self-care       AM-PAC PT "6 Clicks" Mobility  Outcome Measure Help needed turning from your back to your side while in a flat bed without using bedrails?: A Little Help needed moving from lying on your back to sitting on the side of a flat bed without using bedrails?: A Little Help needed moving to and from a bed to a chair (including a wheelchair)?: A Little Help needed standing up from a chair using your arms (e.g., wheelchair or bedside chair)?: A Little Help needed to walk in hospital room?: A Little Help needed climbing 3-5 steps with a railing? : A Little 6 Click Score: 18    End of Session Equipment Utilized During Treatment: Gait belt Activity Tolerance: Patient tolerated treatment well Patient left: in bed (with  transport at bedside) Nurse Communication: Mobility status PT Visit Diagnosis: Other abnormalities of gait and mobility (R26.89);Difficulty in walking, not elsewhere classified (R26.2);Muscle weakness (generalized) (M62.81)    Time: [id number] PT Time Calculation (min) (ACUTE ONLY): 19 min   Charges:   PT Evaluation $PT Eval Low Complexity: 1 Low          Lieutenant Diego PT, DPT 1:18  PM,01/16/22

## 2022-01-16 NOTE — Progress Notes (Deleted)
Established patient visit   Patient: Jeremiah Vance   DOB: 23-Feb-1971   [name]  MRN: [id number] Visit Date: 01/17/2022  Today's healthcare provider: [name], PA-C   No chief complaint on file.  Subjective    HPI  Patient is here for one week follow up .  Medications: Facility-Administered Medications Prior to Visit  Medication Dose Route Frequency Provider   [START ON 01/17/2022]  stroke: early stages of recovery book   Does not apply Once Mansy, Jan A, MD   0.9 % NaCl with KCl 20 mEq/ L  infusion   Intravenous Continuous Mansy, Jan A, MD   acetaminophen (TYLENOL) tablet 650 mg  650 mg Oral Q6H PRN Mansy, Jan A, MD   Or   acetaminophen (TYLENOL) suppository 650 mg  650 mg Rectal Q6H PRN Mansy, Arvella Merles, MD   aspirin EC tablet 81 mg  81 mg Oral Daily Mansy, Jan A, MD   atorvastatin (LIPITOR) tablet 40 mg  40 mg Oral Daily Mickeal Skinner A, RPH   magnesium hydroxide (MILK OF MAGNESIA) suspension 30 mL  30 mL Oral Daily PRN Mansy, Jan A, MD   ondansetron [facility]) tablet 4 mg  4 mg Oral Q6H PRN Mansy, Jan A, MD   Or   ondansetron [facility]) injection 4 mg  4 mg Intravenous Q6H PRN Mansy, Jan A, MD   sodium chloride flush (NS) 0.9 % injection 3 mL  3 mL Intravenous Once Ward, Kristen N, DO   traZODone (DESYREL) tablet 25 mg  25 mg Oral QHS PRN Mansy, Arvella Merles, MD   Outpatient Medications Prior to Visit  Medication Sig   aspirin EC 81 MG tablet Take 1 tablet (81 mg total) by mouth daily. Swallow whole.   atorvastatin (LIPITOR) 40 MG tablet Take 1 tablet (40 mg total) by mouth daily at 6 PM.   carvedilol (COREG) 12.5 MG tablet Take 1 tablet (12.5 mg total) by mouth 2 (two) times daily with a meal.   dapagliflozin propanediol (FARXIGA) 10 MG TABS tablet Take 1 tablet (10 mg) by mouth once daily   glimepiride (AMARYL) 4 MG tablet Take 1 tablet (4 mg total) by mouth daily with breakfast.   hydrALAZINE (APRESOLINE) 50 MG tablet Take 1 tablet (50 mg total) by mouth in the  morning and at bedtime.   insulin degludec (TRESIBA FLEXTOUCH) 200 UNIT/ML FlexTouch Pen Inject 16 Units into the skin daily. (Patient not taking: Reported on 01/09/2022)   irbesartan (AVAPRO) 300 MG tablet Take 300 mg by mouth daily.   linagliptin (TRADJENTA) 5 MG TABS tablet Take 1 tablet (5 mg total) by mouth daily.   torsemide (DEMADEX) 20 MG tablet Take 2 tablets (40 mg total) by mouth daily.    Review of Systems  Constitutional:  Negative for appetite change, chills and fever.  Respiratory:  Negative for chest tightness, shortness of breath and wheezing.   Cardiovascular:  Negative for chest pain and palpitations.  Gastrointestinal:  Negative for abdominal pain, nausea and vomiting.    {Labs  Heme  Chem  Endocrine  Serology  Results Review (optional):23779}   Objective    There were no vitals taken for this visit. {Show previous vital signs (optional):23777}  Physical Exam  ***  No results found for any visits on 01/17/22.  Assessment & Plan     ***  No follow-ups on file.      {provider attestation***:1}   [name], Hershal Coria  [facility] [phone] (  phone) [phone] (fax)  [place]

## 2022-01-16 NOTE — Progress Notes (Signed)
PHARMACIST - PHYSICIAN COMMUNICATION  CONCERNING:  Enoxaparin (Lovenox) for DVT Prophylaxis    RECOMMENDATION: Patient was prescribed enoxaprin 40mg  q24 hours for VTE prophylaxis.   Filed Weights   01/15/22 1938  Weight: (!) 148.8 kg (328 lb)    Body mass index is 47.06 kg/m.  Estimated Creatinine Clearance: 68.2 mL/min (A) (by C-G formula based on SCr of 1.85 mg/dL (H)).   Based on [place] patient is candidate for enoxaparin 0.5mg /kg TBW SQ every 24 hours based on BMI being >30.  DESCRIPTION: Pharmacy has adjusted enoxaparin dose per [facility] policy.  Patient is now receiving enoxaparin 0.5 mg/kg every 24 hours   Renda Rolls, PharmD, [facility] 01/16/2022 2:39 AM

## 2022-01-16 NOTE — Progress Notes (Signed)
[facility]                                                                              [place], is a 51 y.o. male, DOB - 01/07/1971, [id number] Admit date - 01/16/2022    Outpatient Primary MD for the patient is Jeremiah Speak, PA-C  LOS - 0  days  Chief Complaint  Patient presents with   Aphasia       Brief summary   Patient is a 51 year old male with prior history of hemorrhagic CVA in 10/2021 CHF,'s CKD stage III, dyslipidemia, HTN, diabetes mellitus type 2 presented to ED with acute onset of left-sided numbness, left hand weakness that started around 4 PM on the day of admission with associated slurred speech and mild expressive dysphasia.  Patient was admitted for further work-up.  Assessment & Plan    Principal Problem:   Acute CVA (cerebrovascular accident) ([place]) -Presented with left-sided numbness, left upper extremity weakness, slurred speech -MRI brain showed new acute ischemic infarcts up to one 6.6 cm each involving the subcortical left frontal and posterior right frontal lobe, no associated hemorrhage or mass effect.  Multiple scattered chronic microhemorrhages distribution most consistent with chronic poorly controlled hypertension -Neurology consulted, follow 2D echo, MRA, carotid Dopplers -PT OT consult -LDL 61, hemoglobin A1c 10.5 on 11/18/2021 -Passed swallow testing, placed on diet -Per neurology aspirin 81 mg daily only given recent ICH  Active Problems:   Essential hypertension -Permissive hypertension for now    Type 2 diabetes mellitus without complication, with long-term current use of insulin ([place]), uncontrolled with hyperglycemia -Hemoglobin A1c 10.5 on 11/18/2021, will recheck -Placed on moderate sliding scale insulin, carb modified diet    Dyslipidemia -Continue Lipitor    Chronic combined systolic and diastolic CHF (congestive heart failure) (HCC) -Currently euvolemic, no acute issues -2D echo 10/2021 had shown EF of  30 to 35% with global hypokinesis, G1 DD -Strict I's and O's, monitor fluids   Morbid obesity Estimated body mass index is 47.06 kg/m as calculated from the following:   Height as of 01/09/22: 5\' 10"  (1.778 m).   Weight as of this encounter: 148.8 kg.  Code Status: Full code DVT Prophylaxis:  Place and maintain sequential compression device Start: 01/16/22 0445   Level of Care: Level of care: Telemetry Medical Family Communication: Updated patient, currently alert and oriented   Disposition Plan:      Remains inpatient appropriate: Stroke work-up in progress   Procedures:  MRI brain  Consultants:   Neuro  Antimicrobials: None    Medications  [START ON 01/17/2022]  stroke: early stages of recovery book   Does not apply Once   aspirin EC  81 mg Oral Daily   atorvastatin  40 mg Oral Daily   sodium chloride flush  3 mL Intravenous Once      Subjective:   Jeremiah Vance was seen and examined today.  Still has left-sided upper extremity weakness and numbness however he does have chronic weakness on that side from previous stroke.  State he did have slurred speech which is improving.  No acute chest pain or shortness of breath.  Objective:   Vitals:   01/16/22 0921 01/16/22 0945 01/16/22 1000 01/16/22 1124  BP: (!) 144/74  (!) 149/76 (!) 147/74  Pulse: 62  (!) 58 (!) 58  Resp: (!) 22  (!) 22 20  Temp:  98.3 F (36.8 C) 98 F (36.7 C) (!) 97.5 F (36.4 C)  TempSrc:  Oral  Oral  SpO2: 98%  95% 99%  Weight:       No intake or output data in the 24 hours ending 01/16/22 1353   Wt Readings from Last 3 Encounters:  01/15/22 (!) 148.8 kg  01/09/22 (!) 148.8 kg  01/07/22 (!) 149.7 kg     Exam General: Alert and oriented x 3, NAD Cardiovascular: S1 S2 auscultated,  RRR Respiratory: Clear to auscultation bilaterally, no wheezing, rales or rhonchi Gastrointestinal: Soft, nontender, nondistended, + bowel sounds Ext: no pedal edema bilaterally Neuro: left sided  upper extremity weakness 3-4/5, RUE 5/5, bilateral lower extremity 5/5 Psych: Normal affect and demeanor, alert and oriented x3     Data Reviewed:  I have personally reviewed following labs    CBC Lab Results  Component Value Date   WBC 9.0 01/16/2022   RBC 4.19 (L) 01/16/2022   HGB 11.5 (L) 01/16/2022   HCT 36.5 (L) 01/16/2022   MCV 87.1 01/16/2022   MCH 27.4 01/16/2022   PLT 367 01/16/2022   MCHC 31.5 01/16/2022   RDW 13.0 01/16/2022   LYMPHSABS 2.4 01/15/2022   MONOABS 0.8 01/15/2022   EOSABS 0.3 01/15/2022   BASOSABS 0.1 34/19/3790     Last metabolic panel Lab Results  Component Value Date   NA 140 01/16/2022   K 3.4 (L) 01/16/2022   CL 104 01/16/2022   CO2 27 01/16/2022   BUN 27 (H) 01/16/2022   CREATININE 1.86 (H) 01/16/2022   GLUCOSE 90 01/16/2022   GFRNONAA 43 (L) 01/16/2022   GFRAA 72 05/19/2020   CALCIUM 8.8 (L) 01/16/2022   PHOS 4.8 (H) 11/23/2021   PROT 7.8 01/15/2022   ALBUMIN 3.5 01/15/2022   LABGLOB 2.8 05/19/2020   AGRATIO 1.3 05/19/2020   BILITOT 0.9 01/15/2022   ALKPHOS 71 01/15/2022   AST 19 01/15/2022   ALT 17 01/15/2022   ANIONGAP 9 01/16/2022    CBG (last 3)  Recent Labs    01/15/22 1939  GLUCAP 107*      Coagulation Profile: Recent Labs  Lab 01/15/22 1948  INR 1.1     Radiology Studies: I have personally reviewed the imaging studies  MR ANGIO HEAD WO CONTRAST  Result Date: 01/16/2022 CLINICAL DATA:  Stroke follow-up. Acute bilateral frontal infarcts on MRI. EXAM: MRA HEAD WITHOUT CONTRAST TECHNIQUE: Angiographic images of the Circle of Willis were acquired using MRA technique without intravenous contrast. COMPARISON:  Head and neck CTA 11/25/2021 FINDINGS: Anterior circulation: The internal carotid arteries are widely patent from skull base to carotid termini. ACAs and MCAs are patent without evidence of a proximal branch occlusion or significant proximal stenosis. No aneurysm is identified. Posterior circulation: The  intracranial left vertebral artery is patent with unchanged moderate stenosis proximally. Minimal to no flow related enhancement is evident in the right V4 segment which was diminutive and likely occluded distally on the prior CTA. 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 Normal bilateral AICA origins are visualized. The basilar artery is patent with minimal to mild narrowing proximal to the SCA origins. A large left posterior communicating artery is noted. Both PCAs are patent with  similar appearance of severe left P1 and left P3 stenoses compared to the prior CTA. No aneurysm is identified. Anatomic variants: Predominantly fetal type origin of the left PCA. IMPRESSION: 1. No acute large vessel occlusion or significant proximal intracranial stenosis in the anterior circulation. 2. Posterior circulation atherosclerosis with multiple stenoses as detailed above. Electronically Signed   By: Logan Bores M.D.   On: 01/16/2022 11:35   US Carotid Bilateral  Result Date: 01/16/2022 CLINICAL DATA:  Stroke EXAM: BILATERAL CAROTID DUPLEX ULTRASOUND TECHNIQUE: Pearline Cables scale imaging, color Doppler and duplex ultrasound were performed of bilateral carotid and vertebral arteries in the neck. COMPARISON:  CT head neck angiogram 11/25/2021 FINDINGS: Criteria: Quantification of carotid stenosis is based on velocity parameters that correlate the residual internal carotid diameter with NASCET-based stenosis levels, using the diameter of the distal internal carotid lumen as the denominator for stenosis measurement. The following velocity measurements were obtained: RIGHT ICA: 71 cm/sec CCA: 74 cm/sec SYSTOLIC ICA/CCA RATIO:  1.1 ECA: 112 cm/sec LEFT ICA: 61 cm/sec CCA: 123XX123 cm/sec SYSTOLIC ICA/CCA RATIO:  0.7 ECA: 127 cm/sec RIGHT CAROTID ARTERY: There is a minimal amount of atherosclerotic plaque involving the carotid bulb, not resulting in elevated peak systolic velocities within the interrogated course of  the right internal carotid artery to suggest a hemodynamically significant stenosis. RIGHT VERTEBRAL ARTERY:  Antegrade. LEFT CAROTID ARTERY: There is a minimal amount of atherosclerotic plaque involving the carotid bulb, not resulting in elevated peak systolic velocities within the interrogated course of the left internal carotid artery to suggest a hemodynamically significant stenosis. LEFT VERTEBRAL ARTERY:  Antegrade. Upper extremity blood pressures: RIGHT: Not acquired LEFT: Not acquired IMPRESSION: No hemodynamically significant stenosis. Electronically Signed   By: Placido Sou M.D.   On: 01/16/2022 04:03   MR BRAIN WO CONTRAST  Result Date: 01/16/2022 CLINICAL DATA:  Initial evaluation for neuro deficit, stroke suspected. EXAM: MRI HEAD WITHOUT CONTRAST TECHNIQUE: Multiplanar, multiecho pulse sequences of the brain and surrounding structures were obtained without intravenous contrast. COMPARISON:  Prior CT from 01/15/2022 and MRI from 11/23/2021. FINDINGS: Brain: Cerebral volume within normal limits. Patchy and confluent T2/FLAIR hyperintensity involving the periventricular deep white matter both cerebral hemispheres as well as the pons, most consistent with chronic small vessel ischemic disease, advanced for age. Multiple scattered remote lacunar infarcts present about the hemispheric cerebral white matter, deep gray nuclei, and pons. There has been interval evolution of previously seen hemorrhage involving the right external capsule. Previously-seen associated infarct involving the adjacent right corona radiata is now late subacute in appearance measuring up to 1.3 cm (series 5, image 29). New acute ischemic infarcts measuring up to 1.6 cm are seen involving the subcortical left frontal lobe (series 5, image 34) as well as the subcortical posterior right frontal region (series 5, image 32). No associated hemorrhage or mass effect. No other evidence for new or interval infarction. No acute  intracranial hemorrhage. Multiple additional scattered chronic micro hemorrhages noted, most consistent with chronic poorly controlled hypertension. No mass lesion, midline shift or mass effect. No hydrocephalus or extra-axial fluid collection. Pituitary gland and suprasellar region within normal limits. Vascular: Major intracranial vascular flow voids are maintained. Skull and upper cervical spine: Craniocervical junction within normal limits. Bone marrow signal intensity normal. No scalp soft tissue abnormality. Sinuses/Orbits: Abnormal FLAIR signal intensity again noted at the posterior margins of the right greater than left globes (series 15, images 17, 15), indeterminate, but could reflect sequelae of right the attachment and/or vitreous hemorrhage. Appearance is  similar to prior. Globes orbital soft tissues demonstrate no other acute finding. Mild scattered mucosal thickening noted about the ethmoidal air cells. Paranasal sinuses are otherwise clear. No significant mastoid effusion. Other: None. IMPRESSION: 1. New acute ischemic infarcts measuring up to 1.6 cm each involving the subcortical left frontal lobe and posterior right frontal lobe as above. No associated hemorrhage or mass effect. 2. Normal expected evolution of previously seen hemorrhage involving the right external capsule. Previously-seen associated infarct involving the adjacent right corona radiata is now late subacute in appearance. 3. Underlying atrophy with chronic microvascular ischemic disease with multiple additional remote lacunar infarcts. 4. Multiple scattered chronic micro hemorrhages, distribution most consistent with chronic poorly controlled hypertension. 5. Abnormal FLAIR signal intensity involving the posterior aspects of the right greater than left globes. Findings are nonspecific, but could reflect changes of retinal detachment and/or vitreous hemorrhage. Correlation with fundoscopic exam recommended if not already performed.  Appearance is stable as compared to prior MRI from 11/23/2021. Electronically Signed   By: Jeannine Boga M.D.   On: 01/16/2022 01:07   CT HEAD WO CONTRAST  Result Date: 01/15/2022 CLINICAL DATA:  Neuro deficit acute stroke suspected; numbness EXAM: CT HEAD WITHOUT CONTRAST TECHNIQUE: Contiguous axial images were obtained from the base of the skull through the vertex without intravenous contrast. RADIATION DOSE REDUCTION: This exam was performed according to the departmental dose-optimization program which includes automated exposure control, adjustment of the mA and/or kV according to patient size and/or use of iterative reconstruction technique. COMPARISON:  CT head neck angiogram 11/25/2021 FINDINGS: Brain: No intracranial hemorrhage, mass effect, or evidence of acute infarct. No hydrocephalus. No extra-axial fluid collection. Generalized cerebral atrophy. Ill-defined hypoattenuation within the cerebral white matter is nonspecific but consistent with chronic small vessel ischemic disease. The previous intraparenchymal hemorrhage in the right basal ganglia has resolved. Chronic lacunar infarcts right centrum semiovale and corona radiata as well as left basal ganglia. Vascular: No hyperdense vessel. Intracranial arterial calcification. Skull: No fracture or focal lesion. Sinuses/Orbits: No acute finding. Paranasal sinuses and mastoid air cells are well aerated. Other: None. IMPRESSION: No acute intracranial abnormality. Multiple chronic infarcts and small-vessel ischemic disease. Electronically Signed   By: Placido Sou M.D.   On: 01/15/2022 20:09       Cire Clute M.D. [facility] 01/16/2022, 1:53 PM  Available via Epic secure chat 7am-7pm After 7 pm, please refer to night coverage provider listed on amion.

## 2022-01-16 NOTE — Progress Notes (Signed)
*  PRELIMINARY RESULTS* Echocardiogram 2D Echocardiogram has been performed.  Jeremiah Vance 01/16/2022, 12:34 PM

## 2022-01-16 NOTE — ED Notes (Signed)
Pt up to toilet. Hospital bed placed in room and pt assisted to it. Pt placed back on monitor. Urinal at bedside and call bell within reach.

## 2022-01-16 NOTE — Assessment & Plan Note (Signed)
-   We will continue [place], Tradjenta and Amaryl. - We will continue basal coverage. - He will be placed on supplement coverage with NovoLog.

## 2022-01-16 NOTE — ED Provider Notes (Signed)
[facility] Provider Note    Event Date/Time   First MD Initiated Contact with Patient 01/16/22 0021     (approximate)   History   Aphasia   HPI  Jeremiah Vance is a 51 y.o. male history of CVA, chronic kidney disease, CHF, hypertension, hyperlipidemia, diabetes, morbid obesity who presents to the emergency department family with concerns for possible stroke.  Family member at the bedside states she talk to them on the phone around 3 PM yesterday and he was doing well.  He thinks around 4 PM he started feeling not right and states he was having a headache and has are having left-sided numbness.  Feels like the numbness has improved.  No head injury.  He is on aspirin 81 mg daily but no other antiplatelet or anticoagulant.  No fevers, chest pain or shortness of breath.  No new focal weakness.  Family member states that he was stuttering when she got home from work at [employer].   History provided by patient and family.    Past Medical History:  Diagnosis Date   Acute ischemic stroke ([place]) 09/06/2017   Acute renal failure superimposed on stage 3a chronic kidney disease ([place]) 07/08/2019   Acute right-sided weakness    Allergies    Anasarca    Anemia 07/10/2017   Arthritis    CHF (congestive heart failure) ([place])    "coinsided w/kidney problems I was having 06/2017"   Chicken pox    CKD (chronic kidney disease) stage 3, GFR 30-59 ml/min (HCC) 06/2017   Depression    Elevated troponin 07/08/2019   High cholesterol    History of cardiomyopathy    LVEF 40 to 45% in April 2019 - subsequently normalized   Hyperbilirubinemia 07/10/2017   Hypertension    Ischemic stroke ([place])    Small left internal capsule infarct due to lacunar disease   Morbid obesity ([place])    Normocytic anemia 12/16/2017   Recurrent incisional hernia with incarceration s/p repair 10/22/2017 10/21/2017   Stroke ([place]) 08/2017   "right sided weakness since; getting stronger though" (10/22/2017)   Type 2  diabetes mellitus ([place])     Past Surgical History:  Procedure Laterality Date   ABDOMINAL HERNIA REPAIR  2008; 10/22/2017   "scope; OPEN REPAIR INCARCERATED VENTRAL HERNIA   HERNIA REPAIR     KNEE ARTHROSCOPY Right 1989   VENTRAL HERNIA REPAIR N/A 10/22/2017   Procedure: OPEN REPAIR INCARCERATED VENTRAL HERNIA;  Surgeon: Excell Seltzer, MD;  Location: [place];  Service: General;  Laterality: N/A;    MEDICATIONS:  Prior to Admission medications   Medication Sig Start Date End Date Taking? Authorizing Provider  aspirin EC 81 MG tablet Take 1 tablet (81 mg total) by mouth daily. Swallow whole. 11/26/21   Fritzi Mandes, MD  atorvastatin (LIPITOR) 40 MG tablet Take 1 tablet (40 mg total) by mouth daily at 6 PM. 11/26/21   Fritzi Mandes, MD  carvedilol (COREG) 12.5 MG tablet Take 1 tablet (12.5 mg total) by mouth 2 (two) times daily with a meal. 11/26/21   Fritzi Mandes, MD  dapagliflozin propanediol (FARXIGA) 10 MG TABS tablet Take 1 tablet (10 mg) by mouth once daily 01/08/22   Furth, Cadence H, PA-C  glimepiride (AMARYL) 4 MG tablet Take 1 tablet (4 mg total) by mouth daily with breakfast. 11/27/21   Fritzi Mandes, MD  hydrALAZINE (APRESOLINE) 50 MG tablet Take 1 tablet (50 mg total) by mouth in the morning and at bedtime. 12/25/21  Furth, Cadence H, PA-C  insulin degludec (TRESIBA FLEXTOUCH) 200 UNIT/ML FlexTouch Pen Inject 16 Units into the skin daily. Patient not taking: Reported on 01/09/2022 12/04/21   [name], PA-C  linagliptin (TRADJENTA) 5 MG TABS tablet Take 1 tablet (5 mg total) by mouth daily. 11/27/21   Fritzi Mandes, MD  torsemide (DEMADEX) 20 MG tablet Take 2 tablets (40 mg total) by mouth daily. 11/26/21   Fritzi Mandes, MD    Physical Exam   Triage Vital Signs: ED Triage Vitals [01/15/22 1938]  Enc Vitals Group     BP 134/82     Pulse Rate 69     Resp 19     Temp 98.1 F (36.7 C)     Temp Source Oral     SpO2 97 %     Weight (!) 328 lb (148.8 kg)     Height      Head  Circumference      Peak Flow      Pain Score 0     Pain Loc      Pain Edu?      Excl. in [place]?     Most recent vital signs: Vitals:   01/15/22 1938 01/16/22 0129  BP: 134/82 (!) 142/75  Pulse: 69 60  Resp: 19 18  Temp: 98.1 F (36.7 C) 98 F (36.7 C)  SpO2: 97% 95%    CONSTITUTIONAL: Alert and oriented x 3 and responds appropriately to questions.  Morbidly obese, chronically ill-appearing HEAD: Normocephalic, atraumatic EYES: Conjunctivae clear, pupils appear equal, sclera nonicteric ENT: normal nose; moist mucous membranes NECK: Supple, normal ROM CARD: RRR; S1 and S2 appreciated; no murmurs, no clicks, no rubs, no gallops RESP: Normal chest excursion without splinting or tachypnea; breath sounds clear and equal bilaterally; no wheezes, no rhonchi, no rales, no hypoxia or respiratory distress, speaking full sentences ABD/GI: Normal bowel sounds; non-distended; soft, non-tender, no rebound, no guarding, no peritoneal signs BACK: The back appears normal EXT: Normal ROM in all joints; no deformity noted, no edema; no cyanosis SKIN: Normal color for age and race; warm; no rash on exposed skin NEURO: Moves all extremities equally, normal speech, patient is able to lift all of his extremities off the bed but there is some limitation due to chronic pain but there is no drift noted.  He reports normal sensation diffusely.  Cranial nerves II through XII intact. PSYCH: The patient's mood and manner are appropriate.   ED Results / Procedures / Treatments   LABS: (all labs ordered are listed, but only abnormal results are displayed) Labs Reviewed  CBC - Abnormal; Notable for the following components:      Result Value   Hemoglobin 11.8 (*)    HCT 37.3 (*)    All other components within normal limits  COMPREHENSIVE METABOLIC PANEL - Abnormal; Notable for the following components:   Glucose, Bld 107 (*)    BUN 25 (*)    Creatinine, Ser 1.85 (*)    Calcium 8.8 (*)    GFR, Estimated  43 (*)    All other components within normal limits  CBG MONITORING, ED - Abnormal; Notable for the following components:   Glucose-Capillary 107 (*)    All other components within normal limits  PROTIME-INR  APTT  DIFFERENTIAL  ETHANOL  URINALYSIS, ROUTINE W REFLEX MICROSCOPIC  URINE DRUG SCREEN, QUALITATIVE ([facility] ONLY)  LIPID PANEL  BASIC METABOLIC PANEL  CBC     EKG:  EKG Interpretation  Date/Time:  Tuesday January 15 2022 19:42:04 EDT Ventricular Rate:  68 PR Interval:  170 QRS Duration: 106 QT Interval:  410 QTC Calculation: 435 R Axis:   -33 Text Interpretation: Normal sinus rhythm Left axis deviation ST & T wave abnormality, consider lateral ischemia Abnormal ECG No significant change since last tracing Confirmed by Pryor Curia [phone]) on 01/16/2022 12:28:10 AM         RADIOLOGY: My personal review and interpretation of imaging: MRI brain shows acute bilateral frontal ischemic infarcts.  I have personally reviewed all radiology reports.   MR BRAIN WO CONTRAST  Result Date: 01/16/2022 CLINICAL DATA:  Initial evaluation for neuro deficit, stroke suspected. EXAM: MRI HEAD WITHOUT CONTRAST TECHNIQUE: Multiplanar, multiecho pulse sequences of the brain and surrounding structures were obtained without intravenous contrast. COMPARISON:  Prior CT from 01/15/2022 and MRI from 11/23/2021. FINDINGS: Brain: Cerebral volume within normal limits. Patchy and confluent T2/FLAIR hyperintensity involving the periventricular deep white matter both cerebral hemispheres as well as the pons, most consistent with chronic small vessel ischemic disease, advanced for age. Multiple scattered remote lacunar infarcts present about the hemispheric cerebral white matter, deep gray nuclei, and pons. There has been interval evolution of previously seen hemorrhage involving the right external capsule. Previously-seen associated infarct involving the adjacent right corona radiata is now late subacute  in appearance measuring up to 1.3 cm (series 5, image 29). New acute ischemic infarcts measuring up to 1.6 cm are seen involving the subcortical left frontal lobe (series 5, image 34) as well as the subcortical posterior right frontal region (series 5, image 32). No associated hemorrhage or mass effect. No other evidence for new or interval infarction. No acute intracranial hemorrhage. Multiple additional scattered chronic micro hemorrhages noted, most consistent with chronic poorly controlled hypertension. No mass lesion, midline shift or mass effect. No hydrocephalus or extra-axial fluid collection. Pituitary gland and suprasellar region within normal limits. Vascular: Major intracranial vascular flow voids are maintained. Skull and upper cervical spine: Craniocervical junction within normal limits. Bone marrow signal intensity normal. No scalp soft tissue abnormality. Sinuses/Orbits: Abnormal FLAIR signal intensity again noted at the posterior margins of the right greater than left globes (series 15, images 17, 15), indeterminate, but could reflect sequelae of right the attachment and/or vitreous hemorrhage. Appearance is similar to prior. Globes orbital soft tissues demonstrate no other acute finding. Mild scattered mucosal thickening noted about the ethmoidal air cells. Paranasal sinuses are otherwise clear. No significant mastoid effusion. Other: None. IMPRESSION: 1. New acute ischemic infarcts measuring up to 1.6 cm each involving the subcortical left frontal lobe and posterior right frontal lobe as above. No associated hemorrhage or mass effect. 2. Normal expected evolution of previously seen hemorrhage involving the right external capsule. Previously-seen associated infarct involving the adjacent right corona radiata is now late subacute in appearance. 3. Underlying atrophy with chronic microvascular ischemic disease with multiple additional remote lacunar infarcts. 4. Multiple scattered chronic micro  hemorrhages, distribution most consistent with chronic poorly controlled hypertension. 5. Abnormal FLAIR signal intensity involving the posterior aspects of the right greater than left globes. Findings are nonspecific, but could reflect changes of retinal detachment and/or vitreous hemorrhage. Correlation with fundoscopic exam recommended if not already performed. Appearance is stable as compared to prior MRI from 11/23/2021. Electronically Signed   By: Jeannine Boga M.D.   On: 01/16/2022 01:07   CT HEAD WO CONTRAST  Result Date: 01/15/2022 CLINICAL DATA:  Neuro deficit acute stroke suspected; numbness EXAM: CT HEAD WITHOUT CONTRAST TECHNIQUE: Contiguous axial images  were obtained from the base of the skull through the vertex without intravenous contrast. RADIATION DOSE REDUCTION: This exam was performed according to the departmental dose-optimization program which includes automated exposure control, adjustment of the mA and/or kV according to patient size and/or use of iterative reconstruction technique. COMPARISON:  CT head neck angiogram 11/25/2021 FINDINGS: Brain: No intracranial hemorrhage, mass effect, or evidence of acute infarct. No hydrocephalus. No extra-axial fluid collection. Generalized cerebral atrophy. Ill-defined hypoattenuation within the cerebral white matter is nonspecific but consistent with chronic small vessel ischemic disease. The previous intraparenchymal hemorrhage in the right basal ganglia has resolved. Chronic lacunar infarcts right centrum semiovale and corona radiata as well as left basal ganglia. Vascular: No hyperdense vessel. Intracranial arterial calcification. Skull: No fracture or focal lesion. Sinuses/Orbits: No acute finding. Paranasal sinuses and mastoid air cells are well aerated. Other: None. IMPRESSION: No acute intracranial abnormality. Multiple chronic infarcts and small-vessel ischemic disease. Electronically Signed   By: Placido Sou M.D.   On: 01/15/2022  20:09     PROCEDURES:  Critical Care performed: No     .1-3 Lead EKG Interpretation  Performed by: Ramyah Pankowski, Delice Bison, DO Authorized by: Bernadene Garside, Delice Bison, DO     Interpretation: normal     ECG rate:  60   ECG rate assessment: normal     Rhythm: sinus rhythm     Ectopy: none     Conduction: normal       IMPRESSION / MDM / ASSESSMENT AND PLAN / ED COURSE  I reviewed the triage vital signs and the nursing notes.    Patient here with left-sided numbness that has improved, headache, stuttering speech.  Currently NIH stroke scale 0.  Outside tPA window.  The patient is on the cardiac monitor to evaluate for evidence of arrhythmia and/or significant heart rate changes.   DIFFERENTIAL DIAGNOSIS (includes but not limited to):   CVA, TIA, intracranial hemorrhage, complex migraine, anemia, electrolyte derangement   Patient's presentation is most consistent with acute presentation with potential threat to life or bodily function.   PLAN: Patient's labs show no leukocytosis.  Normal coags.  Chronic kidney disease which is stable.  CT of the head reviewed and interpreted by myself and the radiologist and shows no acute abnormality.  We will proceed with MRI.  EKG shows normal sinus rhythm without arrhythmia or new ischemic change.   MEDICATIONS GIVEN IN ED: Medications  sodium chloride flush (NS) 0.9 % injection 3 mL (3 mLs Intravenous Not Given 01/16/22 0049)   stroke: early stages of recovery book (has no administration in time range)  enoxaparin (LOVENOX) injection 75 mg (has no administration in time range)  0.9 % NaCl with KCl 20 mEq/ L  infusion (has no administration in time range)  acetaminophen (TYLENOL) tablet 650 mg (has no administration in time range)    Or  acetaminophen (TYLENOL) suppository 650 mg (has no administration in time range)  traZODone (DESYREL) tablet 25 mg (has no administration in time range)  magnesium hydroxide (MILK OF MAGNESIA) suspension 30 mL (has  no administration in time range)  ondansetron (ZOFRAN) tablet 4 mg (has no administration in time range)    Or  ondansetron (ZOFRAN) injection 4 mg (has no administration in time range)  aspirin EC tablet 81 mg (has no administration in time range)  clopidogrel (PLAVIX) tablet 75 mg (has no administration in time range)  atorvastatin (LIPITOR) tablet 20 mg (has no administration in time range)  acetaminophen (TYLENOL) tablet 1,000 mg (1,000  mg Oral Given 01/16/22 0049)     ED COURSE: MRI brain reviewed and interpreted by myself and the radiologist and shows acute bilateral frontal lobe infarcts.  Patient has NIH stroke scale of 0.  Outside tPA window.  Will discuss with the hospitalist for admission.  States his headache is better after Tylenol.   CONSULTS:  Consulted and discussed patient's case with hospitalist, Dr. Sidney Ace.  I have recommended admission and consulting physician agrees and will place admission orders.  Patient (and family if present) agree with this plan.   I reviewed all nursing notes, vitals, pertinent previous records.  All labs, EKGs, imaging ordered have been independently reviewed and interpreted by myself.    OUTSIDE RECORDS REVIEWED: Reviewed patient's last admission on 11/26/2021 for acute hemorrhagic infarct of the right basal ganglia, hypertensive emergency.       FINAL CLINICAL IMPRESSION(S) / ED DIAGNOSES   Final diagnoses:  Acute CVA (cerebrovascular accident) ([place])     Rx / DC Orders   ED Discharge Orders     None        Note:  This document was prepared using Dragon voice recognition software and may include unintentional dictation errors.   Braycen Burandt, Delice Bison, DO 01/16/22 [phone]

## 2022-01-16 NOTE — Assessment & Plan Note (Addendum)
-   We will continue statin therapy and check fasting lipids in AM. 

## 2022-01-16 NOTE — Assessment & Plan Note (Addendum)
-   This is  bilateral frontal lobe ischemic infarction concerning for embolic CVA. - This is manifested by left-sided numbness and left upper extremity weakness. - The patient will be admitted to a medical telemetry bed. - We will follow neurochecks every 4 hours for 24 hours. - 2D echo with bubble study as well as bilateral carotid Doppler will be obtained. - We will keep the patient on baby aspirin for now and defer adding Plavix to the morning neurologist discretion give scattered multiple microhemorrhages consistent with chronic poorly controlled hypertension seen on brain MRI and history of recent hemorrhagic stroke less than 2 months ago. - Neurology, PT/OT and ST consults will be obtained. - I notified Dr. Rory Percy about the patient.

## 2022-01-17 ENCOUNTER — Telehealth: Payer: Self-pay | Admitting: *Deleted

## 2022-01-17 ENCOUNTER — Ambulatory Visit: Payer: BC Managed Care – PPO

## 2022-01-17 ENCOUNTER — Ambulatory Visit: Payer: BC Managed Care – PPO | Admitting: Physician Assistant

## 2022-01-17 ENCOUNTER — Ambulatory Visit: Payer: BC Managed Care – PPO | Attending: Cardiology

## 2022-01-17 DIAGNOSIS — I5032 Chronic diastolic (congestive) heart failure: Secondary | ICD-10-CM | POA: Diagnosis not present

## 2022-01-17 DIAGNOSIS — I10 Essential (primary) hypertension: Secondary | ICD-10-CM | POA: Diagnosis not present

## 2022-01-17 DIAGNOSIS — I639 Cerebral infarction, unspecified: Secondary | ICD-10-CM

## 2022-01-17 DIAGNOSIS — E785 Hyperlipidemia, unspecified: Secondary | ICD-10-CM | POA: Diagnosis not present

## 2022-01-17 LAB — BASIC METABOLIC PANEL
Anion gap: 8 (ref 5–15)
BUN: 21 mg/dL — ABNORMAL HIGH (ref 6–20)
CO2: 26 mmol/L (ref 22–32)
Calcium: 8.4 mg/dL — ABNORMAL LOW (ref 8.9–10.3)
Chloride: 104 mmol/L (ref 98–111)
Creatinine, Ser: 1.72 mg/dL — ABNORMAL HIGH (ref 0.61–1.24)
GFR, Estimated: 47 mL/min — ABNORMAL LOW (ref >60)
Glucose, Bld: 95 mg/dL (ref 70–99)
Potassium: 3.7 mmol/L (ref 3.5–5.1)
Sodium: 138 mmol/L (ref 135–145)

## 2022-01-17 LAB — ANTITHROMBIN III: AntiThromb III Func: 97 % (ref 75–120)

## 2022-01-17 LAB — GLUCOSE, CAPILLARY
Glucose-Capillary: 79 mg/dL (ref 70–99)
Glucose-Capillary: 109 mg/dL — ABNORMAL HIGH (ref 70–99)

## 2022-01-17 NOTE — TOC Progression Note (Signed)
Transition of Care [facility]) - Progression Note    Patient Details  Name: Jeremiah Vance MRN: [id number] Date of Birth: 01/03/1971  [facility]) CM/SW [place], [place] Phone Number: 01/17/2022, 12:40 PM  Clinical Narrative:      SW met with the patient and he is interested in [place]. [facility] has available staff and will look over the case.      Expected Discharge Plan and Services      HHPT     Expected Discharge Date: 01/17/22                                     Social Determinants of Health (SDOH) Interventions    Readmission Risk Interventions     No data to display

## 2022-01-17 NOTE — Telephone Encounter (Signed)
-----   Message from Gerrie Nordmann, NP sent at 01/17/2022 11:58 AM EDT ----- Regarding: Jeremiah Vance monitor times 2 Please mail him 2 monitors (30 day) with instructions. He is being discharged for cva and they are looking for occult atrial fibrillation. Will also need a follow up appointment after. Thanks

## 2022-01-17 NOTE — Progress Notes (Signed)
Neurology Progress Note   S:// Seen and examined Reports continuing left hand weakness.   O:// Current vital signs: BP (!) 160/70 (BP Location: Right Arm)   Pulse (!) 59   Temp 97.7 F (36.5 C)   Resp 14   Wt (!) 148.8 kg   SpO2 95%   BMI 47.06 kg/m  Vital signs in last 24 hours: Temp:  [97.5 F (36.4 C)-98.3 F (36.8 C)] 97.7 F (36.5 C) (10/19 0814) Pulse Rate:  [58-88] 59 (10/19 0814) Resp:  [14-22] 14 (10/19 0814) BP: (142-165)/(68-92) 160/70 (10/19 0814) SpO2:  [91 %-99 %] 95 % (10/19 0814) General: Awake alert oriented x3 HEENT: Normocephalic atraumatic Lungs: Clear to auscultation Cardiovascular: Regular rhythm Abdomen nondistended nontender Extremities with edema and cellulitic changes Neurological exam Awake alert oriented x3.  Mildly dysarthric.  No evidence of aphasia. 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 Motor examination left upper extremity proximally is 4 -/5 and distally barely 2/5.  Right upper extremity full strength.  Both lower extremities are at least 4/5 today. Sensation intact light touch Coordination with no dysmetria in the right-difficult to perform on the left.  Medications  Current Facility-Administered Medications:    0.9 % NaCl with KCl 20 mEq/ L  infusion, , Intravenous, Continuous, Rai, Ripudeep K, MD, Last Rate: 75 mL/hr at 01/17/22 0549, Infusion Verify at 01/17/22 0549   acetaminophen (TYLENOL) tablet 650 mg, 650 mg, Oral, Q6H PRN **OR** acetaminophen (TYLENOL) suppository 650 mg, 650 mg, Rectal, Q6H PRN, Mansy, Jan A, MD   aspirin EC tablet 81 mg, 81 mg, Oral, Daily, Mansy, Jan A, MD, 81 mg at 01/17/22 0857   atorvastatin (LIPITOR) tablet 40 mg, 40 mg, Oral, Daily, Wynelle Cleveland, RPH, 40 mg at  01/17/22 0857   insulin aspart (novoLOG) injection 0-15 Units, 0-15 Units, Subcutaneous, TID WC, Rai, Ripudeep K, MD   insulin aspart (novoLOG) injection 0-5 Units, 0-5 Units, Subcutaneous, QHS, Rai, Ripudeep K, MD   magnesium hydroxide (MILK OF MAGNESIA) suspension 30 mL, 30 mL, Oral, Daily PRN, Mansy, Jan A, MD   ondansetron (ZOFRAN) tablet 4 mg, 4 mg, Oral, Q6H PRN **OR** ondansetron (ZOFRAN) injection 4 mg, 4 mg, Intravenous, Q6H PRN, Mansy, Jan A, MD, 4 mg at 01/16/22 1401   sodium chloride flush (NS) 0.9 % injection 3 mL, 3 mL, Intravenous, Once, Ward, Kristen N, DO   traZODone (DESYREL) tablet 25 mg, 25 mg, Oral, QHS PRN, Mansy, Jan A, MD Labs CBC    Component Value Date/Time   WBC 9.0 01/16/2022 0518   RBC 4.19 (L) 01/16/2022 0518   HGB 11.5 (L) 01/16/2022 0518   HGB 15.1 05/19/2020 1139   HCT 36.5 (L) 01/16/2022 0518   HCT 47.1 05/19/2020 1139   PLT 367 01/16/2022 0518   PLT 280 05/19/2020 1139   MCV 87.1 01/16/2022 0518   MCV 87 05/19/2020 1139   MCH 27.4 01/16/2022 0518   MCHC 31.5 01/16/2022 0518   RDW 13.0 01/16/2022 0518   RDW 12.4 05/19/2020 1139   LYMPHSABS 2.4 01/15/2022 1948   LYMPHSABS 2.1 05/19/2020 1139   MONOABS 0.8 01/15/2022 1948   EOSABS 0.3 01/15/2022 1948   EOSABS 0.3 05/19/2020 1139   BASOSABS 0.1 01/15/2022 1948   BASOSABS 0.1 05/19/2020 1139    CMP  Component Value Date/Time   NA 138 01/17/2022 0546   NA 137 11/28/2021 1520   K 3.7 01/17/2022 0546   CL 104 01/17/2022 0546   CO2 26 01/17/2022 0546   GLUCOSE 95 01/17/2022 0546   BUN 21 (H) 01/17/2022 0546   BUN 36 (H) 11/28/2021 1520   CREATININE 1.72 (H) 01/17/2022 0546   CALCIUM 8.4 (L) 01/17/2022 0546   PROT 7.8 01/15/2022 1948   PROT 6.4 05/19/2020 1139   ALBUMIN 3.5 01/15/2022 1948   ALBUMIN 3.6 (L) 05/19/2020 1139   AST 19 01/15/2022 1948   ALT 17 01/15/2022 1948   ALKPHOS 71 01/15/2022 1948   BILITOT 0.9 01/15/2022 1948   BILITOT 0.6 05/19/2020 1139   GFRNONAA 47 (L)  01/17/2022 0546   GFRAA 72 05/19/2020 1139    glycosylated hemoglobin-7  Lipid Panel     Component Value Date/Time   CHOL 106 01/16/2022 0518   CHOL 154 05/19/2020 1139   TRIG 104 01/16/2022 0518   HDL 24 (L) 01/16/2022 0518   HDL 39 (L) 05/19/2020 1139   CHOLHDL 4.4 01/16/2022 0518   VLDL 21 01/16/2022 0518   LDLCALC 61 01/16/2022 0518   LDLCALC 100 (H) 05/19/2020 1139   2D echo shows LVEF of 55 to 123456, grade 1 diastolic dysfunction, normal mitral valve, normal atrial size, aortic valve calcification without stenosis.  Imaging I have reviewed images in epic and the results pertinent to this consultation are:  CT-head: No acute intracranial abnormality.  Multiple chronic infarcts and small vessel disease.   MRI examination of the brain: New acute ischemic infarcts up to 1.6 cm each involving the subcortical left frontal and posterior right frontal lobe.  No associated hemorrhage or mass effect.  Normal expected evolution of the previously seen hemorrhage involving the right external capsule/basal ganglia.  Previously seen associated infarct involving the adjacent right corona radiata is now late subacute in appearance.  Underlying atrophy with chronic microvascular ischemic disease with multiple additional remote lacunar infarctions.  Multiple scattered chronic microhemorrhages distribution most consistent with chronic poorly controlled hypertension.  Abnormal FLAIR signal involves posterior aspects of right greater than left globes.  Nonspecific findings but could reflect changes of retinal detachment or vitreous hemorrhage.  Correlation with funduscopic examination recommended.  MR angio of the head: No emergent LVO.  Posterior circulation atherosclerosis with severe left P1 and left P3 stenosis compared to prior CTA.  No aneurysm identified.  Predominantly fetal type origin of the left PCA.  Carotid ultrasound: No significant stenosis in the anterior circulation, antegrade vertebral  artery flow bilaterally.  Assessment:  51 year old with extensive cerebrovascular risk factor history, prior strokes and ICH now presenting with worsening left-sided deficits and slurred speech noted to have acute bifrontal ischemic strokes. Etiology is most likely concomitant small vessel disease given multiple risk factors but given his young age, hypercoagulable work-up also needs to be completed.  Due to the multiple locations/vascular territories involved, long-term cardiac monitoring is also indicated.  Recommendations: Continue aspirin 81 milligrams p.o. daily.  Not considering dual antiplatelets due to recent ICH. Continue statin-Lipitor 40 30-day cardiac monitoring to monitor for any evidence of paroxysmal atrial fibrillation Hypercoagulable panel ordered-will need follow-up with outpatient neurology to follow-up on the status. Follow PT OT recommendations Outpatient neurology follow-up in 8 to 12 weeks.  Plan discussed with Dr. Tana Coast Inpatient neurology will be available as needed  -- Amie Portland, MD Neurologist [facility] Pager: [phone]

## 2022-01-17 NOTE — Inpatient Diabetes Management (Signed)
Inpatient Diabetes Program Recommendations  AACE/ADA: New Consensus Statement on Inpatient Glycemic Control   Target Ranges:  Prepandial:   less than 140 mg/dL      Peak postprandial:   less than 180 mg/dL (1-2 hours)      Critically ill patients:  140 - 180 mg/dL    Latest Reference Range & Units 01/15/22 19:39 01/16/22 16:06 01/16/22 22:00  Glucose-Capillary 70 - 99 mg/dL 107 (H) 137 (H) 96    Latest Reference Range & Units 11/22/21 11:58 01/16/22 14:28  Hemoglobin A1C 4.8 - 5.6 % 10.5 (H) 7.0 (H)   Review of Glycemic Control  Diabetes history: DM2 Outpatient Diabetes medications: Amaryl 4 mg QAM, Farxiga 10 mg daily, Tradjenta 5 mg daily, Tresiba 16 units QHS Current orders for Inpatient glycemic control: Novolog 0-15 units TID with meals, Novolog 0-5 units QHS  Inpatient Diabetes Program Recommendations:    Insulin: Agree with current inpatient DM orders. If glucose starts to become consistently over 180 mg/dl with Novolog correction, may need to consider ordering low dose basal insulin.  HbgA1C: A1C 7% on 01/16/22 indicating an average glucose of 154 mg/dl over the past 2-3 months.  NOTE: Noted consult for diabetes coordinator. Patient was admitted with acute CVA. Noted patient was inpatient 11/22/21-11/26/21 and was seen by inpatient diabetes coordinator on 11/23/21 and 11/26/21. Spoke with patient over the phone to inquire about DM medications. Patient confirms that he is taking Amaryl, Wilder Glade, and Tradjenta as prescribed. Patient states that he takes insulin every night but not sure of the name of the insulin or how much. He reports that his sister draws up the insulin (vial/syringe) and he gives the insulin to himself. Patient reports that he gave himself insulin on 01/15/22 prior to coming to the hospital.  Patient reports that glucose has been trending in the low to mid 100's at home. Discussed current A1C of 7% indicating an average glucose of 154 mg/dl. Commended patient on  improving A1C from 10.5% in August to 7%. Patient states that he has everything he needs at home for DM management.   Thanks, Barnie Alderman, RN, MSN, [place] Diabetes Coordinator Inpatient Diabetes Program [phone] (Team Pager from 8am to [place])

## 2022-01-17 NOTE — Progress Notes (Signed)
Mobility Specialist - Progress Note   01/17/22 0929  Mobility  Activity Ambulated with assistance in hallway;Stood at bedside;Dangled on edge of bed  Level of Assistance Contact guard assist, steadying assist  Assistive Device None  Distance Ambulated (ft) 60 ft  Activity Response Tolerated well  $Mobility charge 1 Mobility   Pt sitting upright in bed on RA upon arrival. Pt completes bed mobility indep with extra time. Pt STS and ambulates in hallway SBA. Pt has no LOB noted but does furniture surf. Pt takes 1 standing rest break. Pt is left in bed with needs in reach.   Gretchen Short  Mobility Specialist  01/17/22 9:32 AM

## 2022-01-17 NOTE — Telephone Encounter (Signed)
2 zio monitors ordered Staff message sent to Markus Daft to arrange shipment to patient Message routed to [facility] for follow up appointment

## 2022-01-17 NOTE — Plan of Care (Signed)

## 2022-01-17 NOTE — Discharge Summary (Addendum)
Physician Discharge Summary   Patient: Jeremiah Vance MRN: [id number] DOB: 1970/05/01  Admit date:     01/16/2022  Discharge date: 01/17/22  Discharge Physician: Estill Cotta, MD    PCP: [name], PA-C   Recommendations at discharge:   Continue aspirin 81 mg daily, statin Patient needs 30-day cardiac monitor.  Discussed with [facility] cardiology, they will arrange. Please follow hypercoagulable work-up  Discharge Diagnoses:    Acute CVA (cerebrovascular accident) [facility])   Essential hypertension   Type 2 diabetes mellitus without complication, with long-term current use of insulin (HCC)   Dyslipidemia   Chronic diastolic CHF (congestive heart failure) ([place]) Morbid obesity    Hospital Course:  Patient is a 51 year old male with prior history of hemorrhagic CVA in 10/2021 CHF,'s CKD stage III, dyslipidemia, HTN, diabetes mellitus type 2 presented to ED with acute onset of left-sided numbness, left hand weakness that started around 4 PM on the day of admission with associated slurred speech and mild expressive dysphasia.  Patient was admitted for further work-up   Assessment and Plan:   Acute CVA (cerebrovascular accident) ([place]) -Presented with left-sided numbness, left upper extremity weakness, slurred speech -MRI brain showed new acute ischemic infarcts up to one 6.6 cm each involving the subcortical left frontal and posterior right frontal lobe, no associated hemorrhage or mass effect.  Multiple scattered chronic microhemorrhages distribution most consistent with chronic poorly controlled hypertension -Neurology was consulted, underwent stroke work-up -MRA head showed no emergent LVO, posterior circulation atherosclerosis with severe left P1 and left P3 stenosis compared to prior CTA, no aneurysm -Carotid ultrasound showed no significant stenosis in anterior circulation, antegrade vertebral flow bilaterally. -2D echo showed EF 55 to 60%, no shunt -PT OT consult recommended  home health PT -LDL 61, hemoglobin A1c 10.5 on 11/18/2021, improved to 7.0 -Per neurology aspirin 81 mg daily only given recent [place] -Neurology recommended hypercoagulable work-up and 30-day cardiac monitor.  Cardiology was consulted to arrange.      Essential hypertension -Resume antihypertensives     Type 2 diabetes mellitus without complication, with long-term current use of insulin (Jeremiah Vance), uncontrolled with hyperglycemia -Hemoglobin A1c improved to 7.0, continue outpatient regimen     Dyslipidemia -Continue Lipitor     Chronic combined systolic and diastolic CHF (congestive heart failure) (HCC) -Currently euvolemic, no acute issues -2D echo 10/2021 had shown EF of 30 to 35% with global hypokinesis, G1 DD -2D echo repeated with bubble study this admission showed EF improved to 55 to 60%, no shunt      Morbid obesity Estimated body mass index is 47.06 kg/m as calculated from the following:   Height as of 01/09/22: 5\' 10"  (1.778 m).   Weight as of this encounter: 148.8 kg.      Pain control - [employer] Controlled Substance Reporting System database was reviewed. and patient was instructed, not to drive, operate heavy machinery, perform activities at heights, swimming or participation in water activities or provide baby-sitting services while on Pain, Sleep and Anxiety Medications; until their outpatient Physician has advised to do so again. Also recommended to not to take more than prescribed Pain, Sleep and Anxiety Medications.  Consultants: Neurology Procedures performed: 2D echo, MRI brain, MRA head, carotid Dopplers Disposition: Home Diet recommendation:  Discharge Diet Orders (From admission, onward)     Start     Ordered   01/17/22 0000  Diet Carb Modified        01/17/22 1147  Carb modified diet DISCHARGE MEDICATION: Allergies as of 01/17/2022       Reactions   Pollen Extract Other (See Comments)   Sulfa Antibiotics         Medication List      TAKE these medications    aspirin EC 81 MG tablet Take 1 tablet (81 mg total) by mouth daily. Swallow whole.   atorvastatin 40 MG tablet Commonly known as: LIPITOR Take 1 tablet (40 mg total) by mouth daily at 6 PM.   carvedilol 12.5 MG tablet Commonly known as: COREG Take 1 tablet (12.5 mg total) by mouth 2 (two) times daily with a meal.   dapagliflozin propanediol 10 MG Tabs tablet Commonly known as: Farxiga Take 1 tablet (10 mg) by mouth once daily   glimepiride 4 MG tablet Commonly known as: AMARYL Take 1 tablet (4 mg total) by mouth daily with breakfast.   hydrALAZINE 50 MG tablet Commonly known as: APRESOLINE Take 1 tablet (50 mg total) by mouth in the morning and at bedtime.   irbesartan 300 MG tablet Commonly known as: AVAPRO Take 300 mg by mouth daily.   linagliptin 5 MG Tabs tablet Commonly known as: TRADJENTA Take 1 tablet (5 mg total) by mouth daily.   torsemide 20 MG tablet Commonly known as: DEMADEX Take 2 tablets (40 mg total) by mouth daily.   Tyler Aas FlexTouch 200 UNIT/ML FlexTouch Pen Generic drug: insulin degludec Inject 16 Units into the skin daily.        Follow-up Information     [name], [place]. Schedule an appointment as soon as possible for a visit in 2 week(s).   Specialty: Physician Assistant Why: for hospital follow-up Contact information: [address] [place] [address] [phone]         [facility]. Schedule an appointment as soon as possible for a visit in 4 week(s).   Specialty: Neurology Why: for hospital follow-up Contact information: [address] [place]        Kate Sable, MD. Call in 1 day(s).   Specialties: Cardiology, Radiology Why: Please call the office tomorrow to arrange 30-day heart monitor Contact information: [place] [address] [phone]                 Discharge Exam: Danley Danker Weights   01/15/22 1938  Weight: (!) 148.8 kg   S: No acute complaints.  Cleared by neurology to discharge home.  Vitals:   01/16/22 2355 01/17/22 0459 01/17/22 0814 01/17/22 1141  BP: (!) 163/78 (!) 164/92 (!) 160/70 (!) 172/72  Pulse: 88 67 (!) 59 (!) 54  Resp:   14 16  Temp: 97.9 F (36.6 C) 97.6 F (36.4 C) 97.7 F (36.5 C) 98.7 F (37.1 C)  TempSrc: Oral Oral    SpO2: 91% 96% 95% 94%  Weight:        Physical Exam General: Alert and oriented x 3, NAD Cardiovascular: S1 S2 clear, RRR.  Respiratory: CTAB, no wheezing, rales or rhonchi Gastrointestinal: Soft, nontender, nondistended, NBS Ext: no pedal edema bilaterally Neuro: left-sided UE weakness Psych: Normal affect and demeanor, alert and oriented x3    Condition at discharge: fair  The results of significant diagnostics from this hospitalization (including imaging, microbiology, ancillary and laboratory) are listed below for reference.   Imaging Studies: ECHOCARDIOGRAM COMPLETE BUBBLE STUDY  Result Date: 01/16/2022    ECHOCARDIOGRAM REPORT   Patient Name:   HAEDEN MARCHAK Date of  Exam: 01/16/2022 Medical Rec #:  [id number]       Height:       70.0 in Accession #:    [id number]      Weight:       328.0 lb Date of Birth:  Oct 10, 1970       BSA:          2.576 m Patient Age:    57 years        BP:           149/76 mmHg Patient Gender: M               HR:           56 bpm. Exam Location:  [facility] Procedure: 2D Echo, Color Doppler, Cardiac Doppler, Saline Contrast Bubble Study            and Intracardiac Opacification Agent Indications:     I63.9 Stroke  History:         Patient has prior history of Echocardiogram examinations, most                  recent 11/23/2021. CHF, Cardiomyopathy and HFrEF, CKD, stage III                  and Stroke; Risk Factors:Hypertension, Sleep Apnea and HCL.  Sonographer:     Charmayne Sheer Referring Phys:  [id number] JAN A MANSY Diagnosing Phys: Kate Sable MD  Sonographer  Comments: Suboptimal apical window. Image acquisition challenging due to patient body habitus and Image acquisition challenging due to respiratory motion. IMPRESSIONS  1. Left ventricular ejection fraction, by estimation, is 55 to 60%. The left ventricle has normal function. The left ventricle has no regional wall motion abnormalities. There is mild left ventricular hypertrophy. Left ventricular diastolic parameters are consistent with Grade I diastolic dysfunction (impaired relaxation).  2. Right ventricular systolic function is normal. The right ventricular size is normal.  3. The mitral valve is grossly normal. No evidence of mitral valve regurgitation.  4. The aortic valve is calcified. Aortic valve regurgitation is not visualized. Aortic valve sclerosis/calcification is present, without any evidence of aortic stenosis.  5. Aortic dilatation noted. There is mild dilatation of the ascending aorta, measuring 43 mm.  6. The inferior vena cava is normal in size with greater than 50% respiratory variability, suggesting right atrial pressure of 3 mmHg.  7. Agitated saline contrast bubble study was negative, with no evidence of any interatrial shunt. FINDINGS  Left Ventricle: Left ventricular ejection fraction, by estimation, is 55 to 60%. The left ventricle has normal function. The left ventricle has no regional wall motion abnormalities. Definity contrast agent was given IV to delineate the left ventricular  endocardial borders. The left ventricular internal cavity size was normal in size. There is mild left ventricular hypertrophy. Left ventricular diastolic parameters are consistent with Grade I diastolic dysfunction (impaired relaxation). Right Ventricle: The right ventricular size is normal. No increase in right ventricular wall thickness. Right ventricular systolic function is normal. Left Atrium: Left atrial size was normal in size. Right Atrium: Right atrial size was normal in size. Pericardium: There is no  evidence of pericardial effusion. Mitral Valve: The mitral valve is grossly normal. No evidence of mitral valve regurgitation. Tricuspid Valve: The tricuspid valve is grossly normal. Tricuspid valve regurgitation is not demonstrated. Aortic Valve: The aortic valve is calcified. Aortic valve regurgitation is not visualized. Aortic valve sclerosis/calcification is present, without any evidence of aortic stenosis. Aortic valve mean  gradient measures 5.0 mmHg. Aortic valve peak gradient measures 10.2 mmHg. Aortic valve area, by VTI measures 3.20 cm. Pulmonic Valve: The pulmonic valve was not well visualized. Pulmonic valve regurgitation is not visualized. Aorta: Aortic dilatation noted. There is mild dilatation of the ascending aorta, measuring 43 mm. Venous: The inferior vena cava is normal in size with greater than 50% respiratory variability, suggesting right atrial pressure of 3 mmHg. IAS/Shunts: No atrial level shunt detected by color flow Doppler. Agitated saline contrast was given intravenously to evaluate for intracardiac shunting. Agitated saline contrast bubble study was negative, with no evidence of any interatrial shunt.  LEFT VENTRICLE PLAX 2D LVIDd:         4.80 cm   Diastology LVIDs:         2.80 cm   LV e' medial:    5.66 cm/s LV PW:         1.50 cm   LV E/e' medial:  12.4 LV IVS:        1.20 cm   LV e' lateral:   7.40 cm/s LVOT diam:     2.40 cm   LV E/e' lateral: 9.5 LV SV:         100 LV SV Index:   39 LVOT Area:     4.52 cm  RIGHT VENTRICLE RV Basal diam:  4.20 cm LEFT ATRIUM             Index LA diam:        3.80 cm 1.48 cm/m LA Vol (A2C):   60.0 ml 23.29 ml/m LA Vol (A4C):   67.2 ml 26.09 ml/m LA Biplane Vol: 64.4 ml 25.00 ml/m  AORTIC VALVE AV Area (Vmax):    2.91 cm AV Area (Vmean):   2.85 cm AV Area (VTI):     3.20 cm AV Vmax:           160.00 cm/s AV Vmean:          103.000 cm/s AV VTI:            0.312 m AV Peak Grad:      10.2 mmHg AV Mean Grad:      5.0 mmHg LVOT Vmax:          103.00 cm/s LVOT Vmean:        65.000 cm/s LVOT VTI:          0.221 m LVOT/AV VTI ratio: 0.71  AORTA Ao Root diam: 3.80 cm MITRAL VALVE MV Area (PHT): 2.66 cm    SHUNTS MV Decel Time: 285 msec    Systemic VTI:  0.22 m MV E velocity: 70.20 cm/s  Systemic Diam: 2.40 cm MV A velocity: 75.50 cm/s MV E/A ratio:  0.93 Kate Sable MD Electronically signed by Kate Sable MD Signature Date/Time: 01/16/2022/3:59:08 PM    Final    MR ANGIO HEAD WO CONTRAST  Result Date: 01/16/2022 CLINICAL DATA:  Stroke follow-up. Acute bilateral frontal infarcts on MRI. EXAM: MRA HEAD WITHOUT CONTRAST TECHNIQUE: Angiographic images of the Circle of Willis were acquired using MRA technique without intravenous contrast. COMPARISON:  Head and neck CTA 11/25/2021 FINDINGS: Anterior circulation: The internal carotid arteries are widely patent from skull base to carotid termini. ACAs and MCAs are patent without evidence of a proximal branch occlusion or significant proximal stenosis. No aneurysm is identified. Posterior circulation: The intracranial left vertebral artery is patent with unchanged moderate stenosis proximally. Minimal to no flow related enhancement is evident in the right V4 segment which was  diminutive and likely occluded distally on the prior CTA. 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 Normal bilateral AICA origins are visualized. The basilar artery is patent with minimal to mild narrowing proximal to the SCA origins. A large left posterior communicating artery is noted. Both PCAs are patent with similar appearance of severe left P1 and left P3 stenoses compared to the prior CTA. No aneurysm is identified. Anatomic variants: Predominantly fetal type origin of the left PCA. IMPRESSION: 1. No acute large vessel occlusion or significant proximal intracranial stenosis in the anterior circulation. 2. Posterior circulation atherosclerosis with multiple stenoses as detailed above.  Electronically Signed   By: Logan Bores M.D.   On: 01/16/2022 11:35   US Carotid Bilateral  Result Date: 01/16/2022 CLINICAL DATA:  Stroke EXAM: BILATERAL CAROTID DUPLEX ULTRASOUND TECHNIQUE: Pearline Cables scale imaging, color Doppler and duplex ultrasound were performed of bilateral carotid and vertebral arteries in the neck. COMPARISON:  CT head neck angiogram 11/25/2021 FINDINGS: Criteria: Quantification of carotid stenosis is based on velocity parameters that correlate the residual internal carotid diameter with NASCET-based stenosis levels, using the diameter of the distal internal carotid lumen as the denominator for stenosis measurement. The following velocity measurements were obtained: RIGHT ICA: 71 cm/sec CCA: 74 cm/sec SYSTOLIC ICA/CCA RATIO:  1.1 ECA: 112 cm/sec LEFT ICA: 61 cm/sec CCA: 123XX123 cm/sec SYSTOLIC ICA/CCA RATIO:  0.7 ECA: 127 cm/sec RIGHT CAROTID ARTERY: There is a minimal amount of atherosclerotic plaque involving the carotid bulb, not resulting in elevated peak systolic velocities within the interrogated course of the right internal carotid artery to suggest a hemodynamically significant stenosis. RIGHT VERTEBRAL ARTERY:  Antegrade. LEFT CAROTID ARTERY: There is a minimal amount of atherosclerotic plaque involving the carotid bulb, not resulting in elevated peak systolic velocities within the interrogated course of the left internal carotid artery to suggest a hemodynamically significant stenosis. LEFT VERTEBRAL ARTERY:  Antegrade. Upper extremity blood pressures: RIGHT: Not acquired LEFT: Not acquired IMPRESSION: No hemodynamically significant stenosis. Electronically Signed   By: Placido Sou M.D.   On: 01/16/2022 04:03   MR BRAIN WO CONTRAST  Result Date: 01/16/2022 CLINICAL DATA:  Initial evaluation for neuro deficit, stroke suspected. EXAM: MRI HEAD WITHOUT CONTRAST TECHNIQUE: Multiplanar, multiecho pulse sequences of the brain and surrounding structures were obtained without  intravenous contrast. COMPARISON:  Prior CT from 01/15/2022 and MRI from 11/23/2021. FINDINGS: Brain: Cerebral volume within normal limits. Patchy and confluent T2/FLAIR hyperintensity involving the periventricular deep white matter both cerebral hemispheres as well as the pons, most consistent with chronic small vessel ischemic disease, advanced for age. Multiple scattered remote lacunar infarcts present about the hemispheric cerebral white matter, deep gray nuclei, and pons. There has been interval evolution of previously seen hemorrhage involving the right external capsule. Previously-seen associated infarct involving the adjacent right corona radiata is now late subacute in appearance measuring up to 1.3 cm (series 5, image 29). New acute ischemic infarcts measuring up to 1.6 cm are seen involving the subcortical left frontal lobe (series 5, image 34) as well as the subcortical posterior right frontal region (series 5, image 32). No associated hemorrhage or mass effect. No other evidence for new or interval infarction. No acute intracranial hemorrhage. Multiple additional scattered chronic micro hemorrhages noted, most consistent with chronic poorly controlled hypertension. No mass lesion, midline shift or mass effect. No hydrocephalus or extra-axial fluid collection. Pituitary gland and suprasellar region within normal limits. Vascular: Major intracranial vascular flow voids are maintained. Skull  and upper cervical spine: Craniocervical junction within normal limits. Bone marrow signal intensity normal. No scalp soft tissue abnormality. Sinuses/Orbits: Abnormal FLAIR signal intensity again noted at the posterior margins of the right greater than left globes (series 15, images 17, 15), indeterminate, but could reflect sequelae of right the attachment and/or vitreous hemorrhage. Appearance is similar to prior. Globes orbital soft tissues demonstrate no other acute finding. Mild scattered mucosal thickening noted  about the ethmoidal air cells. Paranasal sinuses are otherwise clear. No significant mastoid effusion. Other: None. IMPRESSION: 1. New acute ischemic infarcts measuring up to 1.6 cm each involving the subcortical left frontal lobe and posterior right frontal lobe as above. No associated hemorrhage or mass effect. 2. Normal expected evolution of previously seen hemorrhage involving the right external capsule. Previously-seen associated infarct involving the adjacent right corona radiata is now late subacute in appearance. 3. Underlying atrophy with chronic microvascular ischemic disease with multiple additional remote lacunar infarcts. 4. Multiple scattered chronic micro hemorrhages, distribution most consistent with chronic poorly controlled hypertension. 5. Abnormal FLAIR signal intensity involving the posterior aspects of the right greater than left globes. Findings are nonspecific, but could reflect changes of retinal detachment and/or vitreous hemorrhage. Correlation with fundoscopic exam recommended if not already performed. Appearance is stable as compared to prior MRI from 11/23/2021. Electronically Signed   By: Jeannine Boga M.D.   On: 01/16/2022 01:07   CT HEAD WO CONTRAST  Result Date: 01/15/2022 CLINICAL DATA:  Neuro deficit acute stroke suspected; numbness EXAM: CT HEAD WITHOUT CONTRAST TECHNIQUE: Contiguous axial images were obtained from the base of the skull through the vertex without intravenous contrast. RADIATION DOSE REDUCTION: This exam was performed according to the departmental dose-optimization program which includes automated exposure control, adjustment of the mA and/or kV according to patient size and/or use of iterative reconstruction technique. COMPARISON:  CT head neck angiogram 11/25/2021 FINDINGS: Brain: No intracranial hemorrhage, mass effect, or evidence of acute infarct. No hydrocephalus. No extra-axial fluid collection. Generalized cerebral atrophy. Ill-defined  hypoattenuation within the cerebral white matter is nonspecific but consistent with chronic small vessel ischemic disease. The previous intraparenchymal hemorrhage in the right basal ganglia has resolved. Chronic lacunar infarcts right centrum semiovale and corona radiata as well as left basal ganglia. Vascular: No hyperdense vessel. Intracranial arterial calcification. Skull: No fracture or focal lesion. Sinuses/Orbits: No acute finding. Paranasal sinuses and mastoid air cells are well aerated. Other: None. IMPRESSION: No acute intracranial abnormality. Multiple chronic infarcts and small-vessel ischemic disease. Electronically Signed   By: Placido Sou M.D.   On: 01/15/2022 20:09   NM Myocar Multi W/Spect W/Wall Motion / EF  Result Date: 01/08/2022   Normal pharmacologic myocardial perfusion stress test without evidence of significant ischemia or scar.   Left ventricular systolic function is normal (LVEF 60%).   Minimal coronary artery calcification is noted on the attenuation correction CT.   Tiny nodule in the right upper lobe is unchanged from prior chest CTs as recently as 11/29/2021.   This is a low risk study.    Microbiology: Results for orders placed or performed during the hospital encounter of 11/22/21  SARS Coronavirus 2 by RT PCR (hospital order, performed in [facility] hospital lab) *cepheid single result test* Anterior Nasal Swab     Status: None   Collection Time: 11/22/21 10:50 AM   Specimen: Anterior Nasal Swab  Result Value Ref Range Status   SARS Coronavirus 2 by RT PCR NEGATIVE NEGATIVE Final    Comment: (NOTE) SARS-CoV-2  target nucleic acids are NOT DETECTED.  The SARS-CoV-2 RNA is generally detectable in upper and lower respiratory specimens during the acute phase of infection. The lowest concentration of SARS-CoV-2 viral copies this assay can detect is 250 copies / mL. A negative result does not preclude SARS-CoV-2 infection and should not be used as the sole basis  for treatment or other patient management decisions.  A negative result may occur with improper specimen collection / handling, submission of specimen other than nasopharyngeal swab, presence of viral mutation(s) within the areas targeted by this assay, and inadequate number of viral copies (<250 copies / mL). A negative result must be combined with clinical observations, patient history, and epidemiological information.  Fact Sheet for Patients:   https://www.patel.info/  Fact Sheet for Healthcare Providers: https://hall.com/  This test is not yet approved or  cleared by the [place] FDA and has been authorized for detection and/or diagnosis of SARS-CoV-2 by FDA under an Emergency Use Authorization (EUA).  This EUA will remain in effect (meaning this test can be used) for the duration of the COVID-19 declaration under Section 564(b)(1) of the Act, 21 U.S.C. section 360bbb-3(b)(1), unless the authorization is terminated or revoked sooner.  Performed at [facility], [address] [place], [place] [address]   MRSA Next Gen by PCR, Nasal     Status: None   Collection Time: 11/22/21  2:01 PM   Specimen: Nasal Mucosa; Nasal Swab  Result Value Ref Range Status   MRSA by PCR Next Gen NOT DETECTED NOT DETECTED Final    Comment: (NOTE) The GeneXpert MRSA Assay (FDA approved for NASAL specimens only), is one component of a comprehensive MRSA colonization surveillance program. It is not intended to diagnose MRSA infection nor to guide or monitor treatment for MRSA infections. Test performance is not FDA approved in patients less than 47 years old. Performed at [facility], [address] [place],  [address]     Labs: CBC: Recent Labs  Lab 01/15/22 1948 01/16/22 0518  WBC 10.1 9.0  NEUTROABS 6.6  --   HGB 11.8* 11.5*  HCT 37.3* 36.5*  MCV 87.1 87.1  PLT 392 [id number]   Basic Metabolic Panel: Recent  Labs  Lab 01/15/22 1948 01/16/22 0518 01/17/22 0546  NA 138 140 138  K 3.6 3.4* 3.7  CL 102 104 104  CO2 26 27 26   GLUCOSE 107* 90 95  BUN 25* 27* 21*  CREATININE 1.85* 1.86* 1.72*  CALCIUM 8.8* 8.8* 8.4*   Liver Function Tests: Recent Labs  Lab 01/15/22 1948  AST 19  ALT 17  ALKPHOS 71  BILITOT 0.9  PROT 7.8  ALBUMIN 3.5   CBG: Recent Labs  Lab 01/15/22 1939 01/16/22 1606 01/16/22 2200 01/17/22 0811 01/17/22 1138  GLUCAP 107* 137* 96 79 109*    Discharge time spent: greater than 30 minutes.  Signed: Estill Cotta, MD [facility] 01/17/2022

## 2022-01-18 ENCOUNTER — Telehealth: Payer: Self-pay | Admitting: Physician Assistant

## 2022-01-18 ENCOUNTER — Ambulatory Visit: Payer: BC Managed Care – PPO | Attending: Cardiology

## 2022-01-18 DIAGNOSIS — I639 Cerebral infarction, unspecified: Secondary | ICD-10-CM

## 2022-01-18 LAB — LUPUS ANTICOAGULANT PANEL
DRVVT: 31.3 s (ref 0.0–47.0)
PTT Lupus Anticoagulant: 32.1 s (ref 0.0–43.5)

## 2022-01-18 LAB — CARDIOLIPIN ANTIBODIES, IGG, IGM, IGA
Anticardiolipin IgA: <9 APL U/mL (ref 0–11)
Anticardiolipin IgG: 11 GPL U/mL (ref 0–14)
Anticardiolipin IgM: <9 MPL U/mL (ref 0–12)

## 2022-01-18 LAB — BETA-2-GLYCOPROTEIN I ABS, IGG/M/A
Beta-2 Glyco I IgG: <9 GPI IgG units (ref 0–20)
Beta-2-Glycoprotein I IgA: <9 GPI IgA units (ref 0–25)
Beta-2-Glycoprotein I IgM: <9 GPI IgM units (ref 0–32)

## 2022-01-18 LAB — PROTEIN C ACTIVITY: Protein C Activity: 98 % (ref 73–180)

## 2022-01-18 LAB — HOMOCYSTEINE: Homocysteine: 23.3 umol/L — ABNORMAL HIGH (ref 0.0–14.5)

## 2022-01-18 LAB — PROTEIN S, TOTAL: Protein S Ag, Total: 92 % (ref 60–150)

## 2022-01-18 LAB — PROTEIN S ACTIVITY: Protein S Activity: 107 % (ref 63–140)

## 2022-01-18 NOTE — Telephone Encounter (Signed)
Jeremiah Levy, RN  Sent: Fri January 18, 2022  7:46 AM  To: [facility]          Message  Please advise.     I have never ordered 2 consecutive monitors from [facility] but was helping in triage yesterday and got this message. Also did not know y'all schedule monitors and place them. So sorry    ----- Message -----  From: Jennefer Bravo  Sent: 01/17/2022   6:05 PM EDT  To: Karma Ganja, LPN; Jeremiah Levy, RN; *    Sorry Guys, but that was done all wrong.  2 orders for [id number] were placed.  That is a ZIO AT "Live".  Live= Mobile Cardiac Telemetry.   Mobile cardiac telemetry is a live monitor up to 30 days, so you only needed 1 order and write in clinical considerations you want 2 consecutive 14 day monitors, ship the 2nd monitor 14 days from now.(Specific date)    Then you ordered a ZIO AT monitor on a [employer] insurance.  They do not cover MCT so that would be out of pocket for this patient.    Also, I do not know why I was contacted because both monitors were scheduled on the [facility] Nurses schedule and processed.   2 ZIO AT monitors are on the way to the patient.  I would suggest you call [employer] to see how to fix this.  The patient will probable get a call with what his out of pocket cost will be$$$$$$$ be prepared.

## 2022-01-18 NOTE — Telephone Encounter (Signed)
Discussed the information below with our office manager, Izora Gala. She called our [employer] rep, Nereel, to advise him of the incorrect monitors that were ordered.   Per Nereel, the patient is scheduled to receive his AT monitors tomorrow as they have already been mailed out.  He advised that the patient mail these back to the company and we can order the correct monitor (ZIO XT) for shipment.  I called and spoke with the patient. I advised him of the need to wear 2 ZIO XT monitors for CVA. He is aware that the incorrect monitors were ordered and should be delivered to him tomorrow.  I have asked him to send these back to the company once received.   He is aware I will order the correct monitors today. He will receive one in the next 3-5 business days and then we will order the 2nd one to ship at a later date (for a total wear of 28 days).  I have confirmed the mailing address on file is correct. I have also confirmed that he has access to his MyChart and advised him I will forward some information about the monitor to his account.   He is aware to not shower for the 1st 24 hours after applying the monitor and to not completely submerge this under water.  The patient voices understanding of all of the above recommendations and is agreeable.

## 2022-01-18 NOTE — Telephone Encounter (Signed)
Please advise 

## 2022-01-18 NOTE — Telephone Encounter (Signed)
Routed back to [facility] T[facility] to address

## 2022-01-18 NOTE — Telephone Encounter (Signed)
Home Health Verbal Orders - Caller/Agency: Jamelle Haring, [place] Number: [phone] Requesting OT/PT/Skilled Nursing/Social Work/Speech Therapy: Speech  Frequency:   Completed Speech eval, needs verbal order effective 10/23 1w3

## 2022-01-19 LAB — PROTEIN C, TOTAL: Protein C, Total: 99 % (ref 60–150)

## 2022-01-21 NOTE — Progress Notes (Signed)
Hello Jeremiah Vance ,   Your labwork results all are within normal limits Except homocysteine level. We might need to repeat the labs at your next appt   Any questions please reach out to the office or message me on MyChart!  Best, [name], PA-C

## 2022-01-21 NOTE — Telephone Encounter (Signed)
Advised 

## 2022-01-21 NOTE — Telephone Encounter (Signed)
That's fine

## 2022-01-22 ENCOUNTER — Ambulatory Visit (INDEPENDENT_AMBULATORY_CARE_PROVIDER_SITE_OTHER): Payer: BC Managed Care – PPO | Admitting: Physician Assistant

## 2022-01-22 ENCOUNTER — Encounter: Payer: Self-pay | Admitting: Physician Assistant

## 2022-01-22 VITALS — BP 127/70 | HR 87 | Resp 16 | Ht 70.0 in | Wt 321.0 lb

## 2022-01-22 DIAGNOSIS — N183 Chronic kidney disease, stage 3 unspecified: Secondary | ICD-10-CM

## 2022-01-22 DIAGNOSIS — I890 Lymphedema, not elsewhere classified: Secondary | ICD-10-CM

## 2022-01-22 DIAGNOSIS — I639 Cerebral infarction, unspecified: Secondary | ICD-10-CM

## 2022-01-22 DIAGNOSIS — Z09 Encounter for follow-up examination after completed treatment for conditions other than malignant neoplasm: Secondary | ICD-10-CM | POA: Diagnosis not present

## 2022-01-22 DIAGNOSIS — I10 Essential (primary) hypertension: Secondary | ICD-10-CM | POA: Diagnosis not present

## 2022-01-22 DIAGNOSIS — M7989 Other specified soft tissue disorders: Secondary | ICD-10-CM

## 2022-01-22 DIAGNOSIS — I5033 Acute on chronic diastolic (congestive) heart failure: Secondary | ICD-10-CM

## 2022-01-22 DIAGNOSIS — R7989 Other specified abnormal findings of blood chemistry: Secondary | ICD-10-CM

## 2022-01-22 DIAGNOSIS — I5022 Chronic systolic (congestive) heart failure: Secondary | ICD-10-CM

## 2022-01-22 LAB — FACTOR 5 LEIDEN

## 2022-01-22 NOTE — Progress Notes (Unsigned)
I,Raelyn Racette J Shantavia Jha,acting as a Education administrator for [employer], PA-C.,have documented all relevant documentation on the behalf of [name], PA-C,as directed by  [employer], PA-C while in the presence of [employer], PA-C.   Established patient visit   Patient: Jeremiah Vance   DOB: January 29, 1971   [name]  MRN: [id number] Visit Date: 01/22/2022  Today's healthcare provider: [name], PA-C   Chief Complaint  Patient presents with   Follow-up   Subjective    HPI  Follow up Hospitalization  Patient was admitted to [facility] on 10/18 and discharged on 10/19. He was treated for two strokes. Treatment for this included [place] and MRI. Telephone follow up was done on 10/20 He reports fair compliance with treatment. He reports this condition is stayed the same.  ----------------------------------------------------------------------------------------- -  Pt is unhappy with his usual knee&back pain Having complains of left-sided weakness, slurred speach  Medications: Outpatient Medications Prior to Visit  Medication Sig   aspirin EC 81 MG tablet Take 1 tablet (81 mg total) by mouth daily. Swallow whole.   atorvastatin (LIPITOR) 40 MG tablet Take 1 tablet (40 mg total) by mouth daily at 6 PM.   carvedilol (COREG) 12.5 MG tablet Take 1 tablet (12.5 mg total) by mouth 2 (two) times daily with a meal.   dapagliflozin propanediol (FARXIGA) 10 MG TABS tablet Take 1 tablet (10 mg) by mouth once daily   glimepiride (AMARYL) 4 MG tablet Take 1 tablet (4 mg total) by mouth daily with breakfast.   hydrALAZINE (APRESOLINE) 50 MG tablet Take 1 tablet (50 mg total) by mouth in the morning and at bedtime.   insulin degludec (TRESIBA FLEXTOUCH) 200 UNIT/ML FlexTouch Pen Inject 16 Units into the skin daily.   irbesartan (AVAPRO) 300 MG tablet Take 300 mg by mouth daily.   linagliptin (TRADJENTA) 5 MG TABS tablet Take 1 tablet (5 mg total) by mouth daily.   torsemide (DEMADEX) 20 MG tablet Take  2 tablets (40 mg total) by mouth daily.   No facility-administered medications prior to visit.    Review of Systems  All other systems reviewed and are negative. Except see hPI  {Labs  Heme  Chem  Endocrine  Serology  Results Review (optional):23779}   Objective    BP 127/70 (BP Location: Right Arm, Patient Position: Sitting, Cuff Size: Large)   Pulse 87   Resp 16   Ht 5\' 10"  (1.778 m)   Wt (!) 321 lb (145.6 kg)   SpO2 100%   BMI 46.06 kg/m  {Show previous vital signs (optional):23777}  Physical Exam Vitals reviewed.  Constitutional:      General: He is in acute distress.     Appearance: Normal appearance. He is not diaphoretic.  HENT:     Head: Normocephalic and atraumatic.  Eyes:     General: No scleral icterus.    Conjunctiva/sclera: Conjunctivae normal.     Pupils: Pupils are equal, round, and reactive to light.  Cardiovascular:     Rate and Rhythm: Normal rate and regular rhythm.     Pulses: Normal pulses.     Heart sounds: Normal heart sounds. No murmur heard. Pulmonary:     Effort: Pulmonary effort is normal. No respiratory distress.     Breath sounds: Normal breath sounds. No wheezing or rhonchi.  Abdominal:     General: Abdomen is flat. Bowel sounds are normal.     Palpations: Abdomen is soft.  Musculoskeletal:  General: Normal range of motion.     Cervical back: Normal range of motion and neck supple.     Right lower leg: No edema.     Left lower leg: No edema.  Lymphadenopathy:     Cervical: No cervical adenopathy.  Skin:    General: Skin is warm and dry.     Findings: No rash.  Neurological:     Mental Status: He is alert and oriented to person, place, and time. Mental status is at baseline.     Cranial Nerves: Cranial nerve deficit present.     Motor: Weakness (L>R) present.     Coordination: Coordination abnormal.     Gait: Gait abnormal.     Comments: The rest of neuro exam could not be assessed because of pt unwillingness to  proceed.  Psychiatric:        Behavior: Behavior normal.        Thought Content: Thought content normal.        Judgment: Judgment normal.      No results found for any visits on 01/22/22.  Assessment & Plan     1. Hospital discharge follow-up Needs labs including homocysteine Pt agreed to proceed with labs  Cerebrovascular accident (CVA), unspecified mechanism ([place]) Hx of stroke x 2 2-3 years ago Hx of stroke x 1 this April Recent hx of stroke x 2 this month Will need to schedule his appt with Neurology -Presented with left-sided numbness, left upper extremity weakness, slurred speech -MRI brain showed new acute ischemic infarcts up to one 6.6 cm each involving the subcortical left frontal and posterior right frontal lobe, no associated hemorrhage or mass effect.  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 -2D echo showed EF 55 to 60%, no shunt -LDL 61, hemoglobin A1c 10.5 on 11/18/2021, improved to 7.0 -Per neurology aspirin 81 mg daily only given recent [place] -Neurology recommended hypercoagulable work-up was completed with elevated homocysteine  Pt will fu with Cardiology  Pt is waiting for 30-day/28 day cardiac monitor.   2. Chronic combined systolic and diastolic heart failure (HCC) Normal exam, normal vitals Per Hospital stay: -2D echo 10/2021 had shown EF of 30 to 35% with global hypokinesis, G1 DD -2D echo repeated with bubble study this admission showed EF improved to 55 to 60%, no shunt   3. Morbid obesity (HCC) Weight today was 145.6 kg vs 148.8 kg on discharge - Comprehensive metabolic panel - CBC with Differential/Platelet  4.  Essential hypertension Chronic and stable BP today at goal Continue antihypertensives - Comprehensive metabolic panel - CBC with Differential/Platelet  5. Stage 3 chronic kidney disease, unspecified whether stage 3a or 3b CKD (HCC) - Comprehensive metabolic panel - CBC with Differential/Platelet His sister will schedule an appt with nephrology  DM II  Last A1C was 7.0 Continue outpatient regimen  Elevated homocysteine The rest of hypercoagulable work-up was WNl - B12 and Folate Panel - Homocysteine - Vitamin B6  FU PRN Pt insisted that we filled out FMLA paper today Will fax them upon completion  The patient was advised to call back or seek an in-person evaluation if the symptoms worsen or if the condition fails to improve as anticipated.  I discussed the assessment and treatment plan with the patient.  The patient was provided an opportunity to ask questions and all were answered. The patient agreed with the plan and demonstrated an understanding of the instructions.  The entirety of the information documented in the History of Present Illness, Review of Systems and Physical Exam were personally obtained by me. Portions of this information were initially documented by the CMA and reviewed by me for thoroughness and accuracy.  Portions of this note were created using dictation software and may contain typographical errors.       Total encounter time more than 40 minutes  Greater than 50% was spent in counseling and coordination of care with the patient    Elberta Leatherwood  [facility] [phone] (phone) [phone] (fax)  [place]

## 2022-01-23 LAB — PROTHROMBIN GENE MUTATION

## 2022-01-25 ENCOUNTER — Telehealth: Payer: Self-pay | Admitting: Physician Assistant

## 2022-01-25 NOTE — Telephone Encounter (Signed)
Home Health Verbal Orders - Caller/Agency: [facility]  Callback Number: [phone] Requesting OT Frequency: 1x a week for 1 week and  2x's a week for 2 weeks

## 2022-01-26 NOTE — Telephone Encounter (Signed)
That's fine

## 2022-01-27 LAB — CBC WITH DIFFERENTIAL/PLATELET
Basophils Absolute: 0.1 10*3/uL (ref 0.0–0.2)
Basos: 1 %
EOS (ABSOLUTE): 0.3 10*3/uL (ref 0.0–0.4)
Eos: 3 %
Hematocrit: 36.9 % — ABNORMAL LOW (ref 37.5–51.0)
Hemoglobin: 11.9 g/dL — ABNORMAL LOW (ref 13.0–17.7)
Immature Grans (Abs): 0 10*3/uL (ref 0.0–0.1)
Immature Granulocytes: 0 %
Lymphocytes Absolute: 1.9 10*3/uL (ref 0.7–3.1)
Lymphs: 23 %
MCH: 27.9 pg (ref 26.6–33.0)
MCHC: 32.2 g/dL (ref 31.5–35.7)
MCV: 87 fL (ref 79–97)
Monocytes Absolute: 0.6 10*3/uL (ref 0.1–0.9)
Monocytes: 7 %
Neutrophils Absolute: 5.6 10*3/uL (ref 1.4–7.0)
Neutrophils: 66 %
Platelets: 369 10*3/uL (ref 150–450)
RBC: 4.26 x10E6/uL (ref 4.14–5.80)
RDW: 12.9 % (ref 11.6–15.4)
WBC: 8.4 10*3/uL (ref 3.4–10.8)

## 2022-01-27 LAB — COMPREHENSIVE METABOLIC PANEL
ALT: 12 IU/L (ref 0–44)
AST: 16 IU/L (ref 0–40)
Albumin/Globulin Ratio: 1 — ABNORMAL LOW (ref 1.2–2.2)
Albumin: 3.5 g/dL — ABNORMAL LOW (ref 3.8–4.9)
Alkaline Phosphatase: 90 IU/L (ref 44–121)
BUN/Creatinine Ratio: 11 (ref 9–20)
BUN: 17 mg/dL (ref 6–24)
Bilirubin Total: 0.6 mg/dL (ref 0.0–1.2)
CO2: 22 mmol/L (ref 20–29)
Calcium: 9.2 mg/dL (ref 8.7–10.2)
Chloride: 102 mmol/L (ref 96–106)
Creatinine, Ser: 1.61 mg/dL — ABNORMAL HIGH (ref 0.76–1.27)
Globulin, Total: 3.4 g/dL (ref 1.5–4.5)
Glucose: 107 mg/dL — ABNORMAL HIGH (ref 70–99)
Potassium: 4.5 mmol/L (ref 3.5–5.2)
Sodium: 140 mmol/L (ref 134–144)
Total Protein: 6.9 g/dL (ref 6.0–8.5)
eGFR: 51 mL/min/{1.73_m2} — ABNORMAL LOW (ref >59)

## 2022-01-27 LAB — B12 AND FOLATE PANEL
Folate: 3.3 ng/mL (ref >3.0)
Vitamin B-12: 519 pg/mL (ref 232–1245)

## 2022-01-27 LAB — HOMOCYSTEINE: Homocysteine: 30.7 umol/L — ABNORMAL HIGH (ref 0.0–14.5)

## 2022-01-27 LAB — VITAMIN B6: Vitamin B6: 1.9 ug/L — ABNORMAL LOW (ref 3.4–65.2)

## 2022-01-28 ENCOUNTER — Encounter (INDEPENDENT_AMBULATORY_CARE_PROVIDER_SITE_OTHER): Payer: Self-pay

## 2022-01-28 ENCOUNTER — Telehealth: Payer: Self-pay | Admitting: Physician Assistant

## 2022-01-28 NOTE — Telephone Encounter (Signed)
Home Health Verbal Orders - Caller/Agency: [facility] Bells with [place] Number: [phone] Requesting PT Frequency: 1 wk for 3 wks, 2 x wk for 5 wks  Please assist further

## 2022-01-29 ENCOUNTER — Telehealth: Payer: Self-pay

## 2022-01-29 ENCOUNTER — Other Ambulatory Visit: Payer: Self-pay | Admitting: Physician Assistant

## 2022-01-29 NOTE — Progress Notes (Signed)
Please, let pt know that his homocysteine is high and vit B 6 is low. Asked him to contact Neurology and if they could schedule his appt early. Elevated homocysteine could increase his risk for cerebrovascular disease Meanwhile, please, start taking vit B OTC 25 to 100 mg daily and Vitamin B6 enriched food/whole grains, meats, vegetables, and nuts Overall, healthy low carb low fat diet advised and daily exercise.

## 2022-01-29 NOTE — Telephone Encounter (Signed)
Patient advised. He says he already has a Neurology appointment scheduled for 02/27/2022. Patient says that this was the soonest that they could see him.   Patient also wants to know what type of nuts your recommend?

## 2022-01-29 NOTE — Telephone Encounter (Signed)
That's fine

## 2022-01-29 NOTE — Telephone Encounter (Signed)
-----   Message from [name], [place] sent at 01/29/2022  2:46 PM EDT ----- Please, let pt know that his homocysteine is high and vit B 6 is low. Asked him to contact Neurology and if they could schedule his appt early. Elevated homocysteine could increase his risk for cerebrovascular disease Meanwhile, please, start taking vit B OTC 25 to 100 mg daily and Vitamin B6 enriched food/whole grains, meats, vegetables, and nuts Overall, healthy low carb low fat diet advised and daily exercise.

## 2022-01-29 NOTE — Telephone Encounter (Signed)
Returned Esther's call and left a detailed message on secure voice message system.

## 2022-01-30 NOTE — Telephone Encounter (Signed)
Advised 

## 2022-01-31 ENCOUNTER — Ambulatory Visit: Payer: BC Managed Care – PPO | Attending: Medical

## 2022-01-31 ENCOUNTER — Other Ambulatory Visit: Payer: Self-pay | Admitting: *Deleted

## 2022-01-31 DIAGNOSIS — I639 Cerebral infarction, unspecified: Secondary | ICD-10-CM

## 2022-02-03 DIAGNOSIS — I639 Cerebral infarction, unspecified: Secondary | ICD-10-CM

## 2022-02-05 ENCOUNTER — Telehealth: Payer: Self-pay | Admitting: Physician Assistant

## 2022-02-05 NOTE — Telephone Encounter (Signed)
Please advise 

## 2022-02-05 NOTE — Telephone Encounter (Signed)
Jeremiah Vance w/ [facility] (speech pathologist) would like an order for a Modified Barium Swallow test at [facility] scheduling.  Faxed to [phone]  Cb  [phone]

## 2022-02-06 ENCOUNTER — Other Ambulatory Visit: Payer: Self-pay

## 2022-02-06 DIAGNOSIS — R1310 Dysphagia, unspecified: Secondary | ICD-10-CM

## 2022-02-11 ENCOUNTER — Ambulatory Visit: Payer: BC Managed Care – PPO | Attending: Medical | Admitting: Medical

## 2022-02-11 ENCOUNTER — Encounter: Payer: Self-pay | Admitting: Medical

## 2022-02-11 VITALS — BP 176/93 | HR 60 | Ht 70.0 in | Wt 316.4 lb

## 2022-02-11 DIAGNOSIS — G4733 Obstructive sleep apnea (adult) (pediatric): Secondary | ICD-10-CM

## 2022-02-11 DIAGNOSIS — I890 Lymphedema, not elsewhere classified: Secondary | ICD-10-CM | POA: Diagnosis not present

## 2022-02-11 DIAGNOSIS — I639 Cerebral infarction, unspecified: Secondary | ICD-10-CM

## 2022-02-11 DIAGNOSIS — I5022 Chronic systolic (congestive) heart failure: Secondary | ICD-10-CM

## 2022-02-11 DIAGNOSIS — I10 Essential (primary) hypertension: Secondary | ICD-10-CM

## 2022-02-11 DIAGNOSIS — N183 Chronic kidney disease, stage 3 unspecified: Secondary | ICD-10-CM

## 2022-02-11 DIAGNOSIS — I2510 Atherosclerotic heart disease of native coronary artery without angina pectoris: Secondary | ICD-10-CM | POA: Diagnosis not present

## 2022-02-11 NOTE — Patient Instructions (Signed)
Medication Instructions:  Your physician recommends that you continue on your current medications as directed. Please refer to the Current Medication list given to you today.  *If you need a refill on your cardiac medications before your next appointment, please call your pharmacy*   Lab Work: None ordered  If you have labs (blood work) drawn today and your tests are completely normal, you will receive your results only by: MyChart Message (if you have MyChart) OR A paper copy in the mail If you have any lab test that is abnormal or we need to change your treatment, we will call you to review the results.   Testing/Procedures: None ordered  Follow-Up: At [facility], you and your health needs are our priority.  As part of our continuing mission to provide you with exceptional heart care, we have created designated Provider Care Teams.  These Care Teams include your primary Cardiologist (physician) and Advanced Practice Providers (APPs -  Physician Assistants and Nurse Practitioners) who all work together to provide you with the care you need, when you need it.  We recommend signing up for the patient portal called "MyChart".  Sign up information is provided on this After Visit Summary.  MyChart is used to connect with patients for Virtual Visits (Telemedicine).  Patients are able to view lab/test results, encounter notes, upcoming appointments, etc.  Non-urgent messages can be sent to your provider as well.   To learn more about what you can do with MyChart, go to [email or web address].    Your next appointment:   3 month(s)  The format for your next appointment:   In Person  Provider:   You may see Brian Agbor-Etang, MD or one of the following Advanced Practice Providers on your designated Care Team:   Christopher Berge, NP Ryan Dunn, PA-C Cadence Furth, PA-C Sheri Hammock, NP   Important Information About Sugar       

## 2022-02-11 NOTE — Progress Notes (Signed)
Cardiology Office Note:    Date:  02/11/2022   ID:  Jeremiah Vance, DOB 06/23/1970, MRN [id number]  PCP:  Debera Lat, PA-C  [facility] Cardiologist:  Debbe Odea, MD  [facility] Electrophysiologist:  None   Referring MD: Debera Lat, PA-C   Chief Complaint: 1 month follow-up  History of Present Illness:    Jeremiah Vance is a 51 y.o. male with a hx of medication noncompliance, HTN, morbid obesity, chronic systolic CHF, HFimpEF, poorly controlled diabetes Type 2, stroke x3, ICH August 2023, OSA noncompliant with CPAP, chronic lower extremity edema, and CKD stage III who presents for 6 month follow-up.   Admitted in 07/2019 with CHF. Echo showed LVEF 50-55%. He was diuresed with IV lasix.    Admitted in August 2023 for headache, dizziness, and slurred speech. CT head showed small acute hemorrhage. BP in the ER was high and he was started on nicardipine infusion with improvement of BP. He was transitioned to irbesartan, Coreg and Hydralazine. UDS positive for tricyclic antidepressant. ASA was started at d/c. Echo showed LVEF 30-35%, G1DD, He was diuresed and sent home on torsemide 40mg  daily. LHC was not performed during admission due to CKD and small ICH.   He went to the ED 11/29/21 for chest pain. Vitals were normal. HS troponin was minimally elevated to 44. Ddimer was elevated, CT chest was negative. He was offered admission, but declined.    Seen 12/17/21 and reported dizziness. BP was low and Hydralazine was stopped. For reduced EF, a Myoview Lexsican was ordered.Follow-up labs showed worsening Scr/BUN and irbesartan was stopped, and hydralazine was restarted.   Myoview Lexiscan showed normal perfusion without significant ischemia or scar, normal LVEF, minimal coronary artery calcification, overall low risk.  Last seen 01/07/22 and was overall doing well.  He was admitted in October for CVA. He presented with slurred speech. MRI of the brain showed new acute  ischemic infarcts up to 6.6 cm each involving the subcortical left frontal and posterior right frontal lobe, no associated hemorrhage or mass effect.  Multiple scattered chronic hemorrhages distribution most consistent with chronic poorly controlled hypertension.  Neurology was consulted.  MRA of the head showed no emergent LVO, posterior circulation atherosclerosis with severe left P1 and P3 stenosis compared to prior CTA, no aneurysm.  Carotid ultrasound showed no significant stenosis in the anterior circulation, antegrade vertebral flow bilaterally.Echo 01/16/22 showed LVEF 55-60%, no WMA, mild LVH, G1DD, normal RVSF, mild dilation of the ascending aorta, no shunt. He was discharged with ASA given ICH.  The patient was discharged with a 30-day heart monitor.  Today, the patient reports he is overall doing well since the stroke.  He reports some numbness and tingling in his left fingers. He is doing rehab and making good progress.  Patient reports that he fell on Saturday after missing a step. He fell on his left knee, so still has residual pain. The Myovoiew lexiscan and echocardiogram was reviewed. BP is high today, however he forgot to take his medications today. He denies chest pain or shortness of breath.   Past Medical History:  Diagnosis Date   Acute ischemic stroke (HCC) 09/06/2017   Acute renal failure superimposed on stage 3a chronic kidney disease (HCC) 07/08/2019   Acute right-sided weakness    Allergies    Anasarca    Anemia 07/10/2017   Arthritis    CHF (congestive heart failure) (HCC)    "coinsided w/kidney problems I was having 06/2017"   Chicken pox  CKD (chronic kidney disease) stage 3, GFR 30-59 ml/min (HCC) 06/2017   Depression    Elevated troponin 07/08/2019   High cholesterol    History of cardiomyopathy    LVEF 40 to 45% in April 2019 - subsequently normalized   Hyperbilirubinemia 07/10/2017   Hypertension    Ischemic stroke (HCC)    Small left internal capsule infarct due  to lacunar disease   Morbid obesity (HCC)    Normocytic anemia 12/16/2017   Recurrent incisional hernia with incarceration s/p repair 10/22/2017 10/21/2017   Stroke (HCC) 08/2017   "right sided weakness since; getting stronger though" (10/22/2017)   Type 2 diabetes mellitus [facility])     Past Surgical History:  Procedure Laterality Date   ABDOMINAL HERNIA REPAIR  2008; 10/22/2017   "scope; OPEN REPAIR INCARCERATED VENTRAL HERNIA   HERNIA REPAIR     KNEE ARTHROSCOPY Right 1989   VENTRAL HERNIA REPAIR N/A 10/22/2017   Procedure: OPEN REPAIR INCARCERATED VENTRAL HERNIA;  Surgeon: Glenna Fellows, MD;  Location: [facility] OR;  Service: General;  Laterality: N/A;    Current Medications: Current Meds  Medication Sig   aspirin EC 81 MG tablet Take 1 tablet (81 mg total) by mouth daily. Swallow whole.   atorvastatin (LIPITOR) 40 MG tablet Take 1 tablet (40 mg total) by mouth daily at 6 PM.   carvedilol (COREG) 12.5 MG tablet Take 1 tablet (12.5 mg total) by mouth 2 (two) times daily with a meal.   dapagliflozin propanediol (FARXIGA) 10 MG TABS tablet Take 1 tablet (10 mg) by mouth once daily   glimepiride (AMARYL) 4 MG tablet Take 1 tablet (4 mg total) by mouth daily with breakfast.   hydrALAZINE (APRESOLINE) 50 MG tablet Take 1 tablet (50 mg total) by mouth in the morning and at bedtime.   insulin degludec (TRESIBA FLEXTOUCH) 200 UNIT/ML FlexTouch Pen Inject 16 Units into the skin daily.   irbesartan (AVAPRO) 300 MG tablet Take 300 mg by mouth daily.   linagliptin (TRADJENTA) 5 MG TABS tablet Take 1 tablet (5 mg total) by mouth daily.   torsemide (DEMADEX) 20 MG tablet Take 2 tablets (40 mg total) by mouth daily.     Allergies:   Pollen extract and Sulfa antibiotics   Social History   Socioeconomic History   Marital status: Divorced    Spouse name: Not on file   Number of children: Not on file   Years of education: Not on file   Highest education level: Not on file  Occupational History    Not on file  Tobacco Use   Smoking status: Never   Smokeless tobacco: Never  Vaping Use   Vaping Use: Never used  Substance and Sexual Activity   Alcohol use: Yes    Comment: occasional beer   Drug use: Never   Sexual activity: Not Currently  Other Topics Concern   Not on file  Social History Narrative   He lives with his sister , [name] and she is his MPOA after his stokes   Social Determinants of Health   Financial Resource Strain: High Risk (03/20/2020)   Overall Financial Resource Strain (CARDIA)    Difficulty of Paying Living Expenses: Hard  Food Insecurity: No Food Insecurity (01/16/2022)   Hunger Vital Sign    Worried About Running Out of Food in the Last Year: Never true    Ran Out of Food in the Last Year: Never true  Transportation Needs: Unmet Transportation Needs (01/16/2022)   PRAPARE - Transportation  Lack of Transportation (Medical): Yes    Lack of Transportation (Non-Medical): Yes  Physical Activity: Not on file  Stress: Not on file  Social Connections: Not on file     Family History: The patient's family history includes Arthritis in his father, mother, paternal grandfather, and paternal grandmother; Depression in his maternal grandmother, mother, and sister; Diabetes in his father, maternal grandmother, mother, sister, and sister; Hearing loss in his father and paternal grandmother; Heart attack in his father and paternal grandfather; Heart disease in his father, maternal grandmother, mother, and paternal grandfather; Hyperlipidemia in his father, maternal grandfather, and maternal grandmother; Hypertension in his father, maternal grandfather, maternal grandmother, mother, and sister; Learning disabilities in his mother; Mental illness in his mother and sister; Stroke in his father, maternal grandfather, maternal grandmother, mother, and paternal grandfather.  ROS:   Please see the history of present illness.     All other systems reviewed and are  negative.  EKGs/Labs/Other Studies Reviewed:    The following studies were reviewed today:  Echo bubble study 01/16/22  1. Left ventricular ejection fraction, by estimation, is 55 to 60%. The  left ventricle has normal function. The left ventricle has no regional  wall motion abnormalities. There is mild left ventricular hypertrophy.  Left ventricular diastolic parameters  are consistent with Grade I diastolic dysfunction (impaired relaxation).   2. Right ventricular systolic function is normal. The right ventricular  size is normal.   3. The mitral valve is grossly normal. No evidence of mitral valve  regurgitation.   4. The aortic valve is calcified. Aortic valve regurgitation is not  visualized. Aortic valve sclerosis/calcification is present, without any  evidence of aortic stenosis.   5. Aortic dilatation noted. There is mild dilatation of the ascending  aorta, measuring 43 mm.   6. The inferior vena cava is normal in size with greater than 50%  respiratory variability, suggesting right atrial pressure of 3 mmHg.   7. Agitated saline contrast bubble study was negative, with no evidence  of any interatrial shunt.   Myoview Lexiscan 01/01/22 Narrative & Impression      Normal pharmacologic myocardial perfusion stress test without evidence of significant ischemia or scar.   Left ventricular systolic function is normal (LVEF 60%).   Minimal coronary artery calcification is noted on the attenuation correction CT.   Tiny nodule in the right upper lobe is unchanged from prior chest CTs as recently as 11/29/2021.   This is a low risk study.       Echo 10/2021  1. Left ventricular ejection fraction, by estimation, is 30 to 35%. The  left ventricle has moderately decreased function. The left ventricle  demonstrates global hypokinesis. There is severe left ventricular  hypertrophy. Left ventricular diastolic  parameters are consistent with Grade I diastolic dysfunction (impaired   relaxation).   2. Right ventricular systolic function is mildly reduced. The right  ventricular size is mildly enlarged. There is normal pulmonary artery  systolic pressure. The estimated right ventricular systolic pressure is  22.7 mmHg.   3. Left atrial size was moderately dilated.   4. The mitral valve is normal in structure. No evidence of mitral valve  regurgitation. No evidence of mitral stenosis.   5. The aortic valve is normal in structure. Aortic valve regurgitation is  not visualized. Aortic valve sclerosis is present, with no evidence of  aortic valve stenosis.   6. There is mild dilatation of the aortic root, measuring 42 mm.   7. The  inferior vena cava is normal in size with greater than 50%  respiratory variability, suggesting right atrial pressure of 3 mmHg.   8. Challenging images    Echo 07/2019   1. Left ventricular ejection fraction, by estimation, is 50 to 55%. The  left ventricle has low normal function. The left ventricle demonstrates  global hypokinesis. There is mild left ventricular hypertrophy. Left  ventricular diastolic parameters are  consistent with Grade II diastolic dysfunction (pseudonormalization).   2. Right ventricular systolic function is normal. The right ventricular  size is mildly enlarged. Tricuspid regurgitation signal is inadequate for  assessing PA pressure.   3. Left atrial size was mildly dilated.  EKG:  EKG is not ordered today.    Recent Labs: 11/22/2021: TSH 0.842 11/24/2021: Magnesium 2.1 11/28/2021: NT-Pro BNP 966 11/29/2021: B Natriuretic Peptide 66.0 01/22/2022: ALT 12; BUN 17; Creatinine, Ser 1.61; Hemoglobin 11.9; Platelets 369; Potassium 4.5; Sodium 140  Recent Lipid Panel    Component Value Date/Time   CHOL 106 01/16/2022 0518   CHOL 154 05/19/2020 1139   TRIG 104 01/16/2022 0518   HDL 24 (L) 01/16/2022 0518   HDL 39 (L) 05/19/2020 1139   CHOLHDL 4.4 01/16/2022 0518   VLDL 21 01/16/2022 0518   LDLCALC 61 01/16/2022  0518   LDLCALC 100 (H) 05/19/2020 1139    Physical Exam:    VS:  BP (!) 176/93 (BP Location: Right Arm, Patient Position: Sitting, Cuff Size: Large)   Pulse 60   Ht  (1.778 m)   Wt (!) 316 lb 6.4 oz (143.5 kg)   SpO2 100%   BMI 45.40 kg/m     Wt Readings from Last 3 Encounters:  02/11/22 (!) 316 lb 6.4 oz (143.5 kg)  01/22/22 (!) 321 lb (145.6 kg)  01/15/22 (!) 328 lb (148.8 kg)     GEN:  Well nourished, well developed in no acute distress HEENT: Normal NECK: No JVD; No carotid bruits LYMPHATICS: No lymphadenopathy CARDIAC: RRR, no murmurs, rubs, gallops RESPIRATORY:  Clear to auscultation without rales, wheezing or rhonchi  ABDOMEN: Soft, non-tender, non-distended MUSCULOSKELETAL:  No edema; No deformity  SKIN: Warm and dry NEUROLOGIC:  Alert and oriented x 3 PSYCHIATRIC:  Normal affect   ASSESSMENT:    1. Chronic systolic heart failure (HCC)   2. Lymphedema   3. Cerebrovascular accident (CVA), unspecified mechanism (HCC)   4. Coronary artery disease involving native coronary artery of native heart without angina pectoris   5. Stage 3 chronic kidney disease, unspecified whether stage 3a or 3b CKD (HCC)   6. Essential hypertension   7. OSA (obstructive sleep apnea)    PLAN:    In order of problems listed above:  HFimpEF Chronic systolic heart failure Lymphedema Recent echo showed improved EF 55 to 60%.  The patient appears euvolemic on exam.  The patient is taking torsemide 40 mg daily.  Continue Coreg 12.5 mg twice daily, irbesartan 300 mg daily and Farxiga 10 mg daily.  CVA Patient has a history of multiple strokes.  Recent CVA with admission in October 2023.  The patient denies residual weakness, he has numbness and tingling in the fingers of his left hand.  He is doing rehab with good progress. The patient was discharged on only aspirin given ICH in August 2023.  He follows up with neurology at the end of the month.  Patient is wearing a 30-day heart  monitor.  Coronary calcifications Patient denies any chest pain or significant shortness of breath.  Recent Myoview Lexiscan showed no significant ischemia or scar, LVEF 60%, minimal coronary artery calcification, overall low risk study.  Continue aspirin, statin, Coreg. No further ischemic work-up at this time.   HTN BP is high today, however he has not had his medications. The patient will go home and take his meds. No changes today.  CKD stage 3 Most recent labs showed stable kidney function, creatinine 1.61, BUN 17.  OSA Continue CPAP therapy.  Disposition: Follow up in 3 month(s) with MD/APP    Signed, Kailen Hinkle David Stall, PA-C  02/11/2022 9:43 AM    [facility]

## 2022-02-13 ENCOUNTER — Institutional Professional Consult (permissible substitution): Payer: BC Managed Care – PPO | Admitting: Student in an Organized Health Care Education/Training Program

## 2022-02-15 DIAGNOSIS — I130 Hypertensive heart and chronic kidney disease with heart failure and stage 1 through stage 4 chronic kidney disease, or unspecified chronic kidney disease: Secondary | ICD-10-CM | POA: Diagnosis not present

## 2022-02-15 DIAGNOSIS — N1831 Chronic kidney disease, stage 3a: Secondary | ICD-10-CM

## 2022-02-15 DIAGNOSIS — E1122 Type 2 diabetes mellitus with diabetic chronic kidney disease: Secondary | ICD-10-CM

## 2022-02-15 DIAGNOSIS — E1165 Type 2 diabetes mellitus with hyperglycemia: Secondary | ICD-10-CM

## 2022-02-15 DIAGNOSIS — I5042 Chronic combined systolic (congestive) and diastolic (congestive) heart failure: Secondary | ICD-10-CM | POA: Diagnosis not present

## 2022-02-15 DIAGNOSIS — D631 Anemia in chronic kidney disease: Secondary | ICD-10-CM

## 2022-02-15 DIAGNOSIS — I69322 Dysarthria following cerebral infarction: Secondary | ICD-10-CM | POA: Diagnosis not present

## 2022-02-15 DIAGNOSIS — I429 Cardiomyopathy, unspecified: Secondary | ICD-10-CM

## 2022-02-15 DIAGNOSIS — I69354 Hemiplegia and hemiparesis following cerebral infarction affecting left non-dominant side: Secondary | ICD-10-CM | POA: Diagnosis not present

## 2022-02-20 DIAGNOSIS — I639 Cerebral infarction, unspecified: Secondary | ICD-10-CM

## 2022-02-23 IMAGING — CT CT HEAD W/O CM
3 series · 16 of 47 positions shown, 19 images · non-contrast
Comparison: 07/08/2019

CLINICAL DATA: Transient ischemic attack

EXAM:
CT HEAD WITHOUT CONTRAST
TECHNIQUE: Contiguous axial images were obtained from the base of the skull
through the vertex without intravenous contrast.

[Series 2: head wo · axial · 0.47mm/px · z∈[-126,+24]mm · 10 of 36 slices shown, 13 images]
[im 3/36  brain]
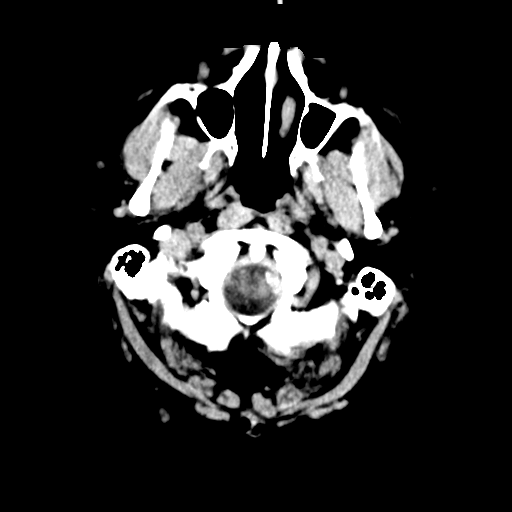
[im 3/36  bone]
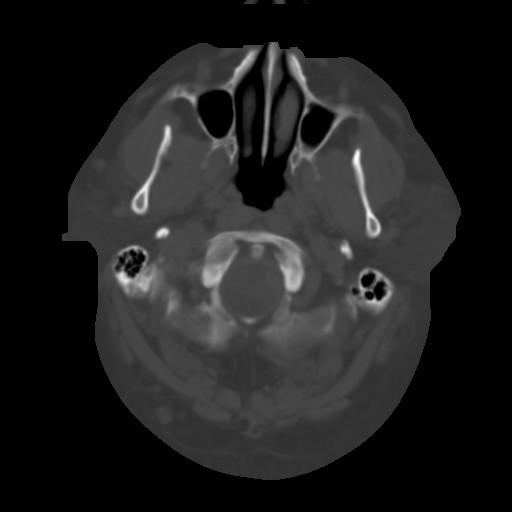
[im 7/36  brain]
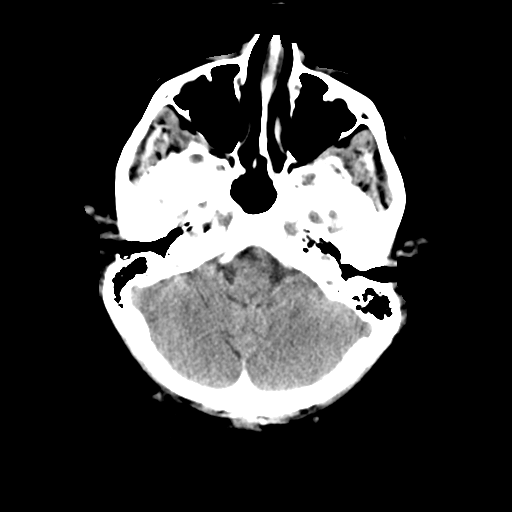
[im 10/36  brain]
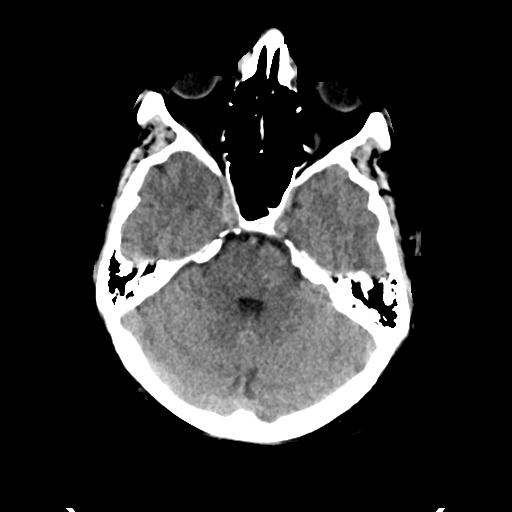
[im 13/36  brain]
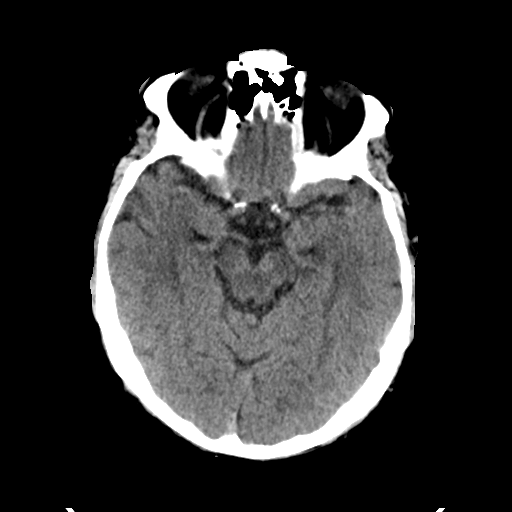
[im 16/36  brain]
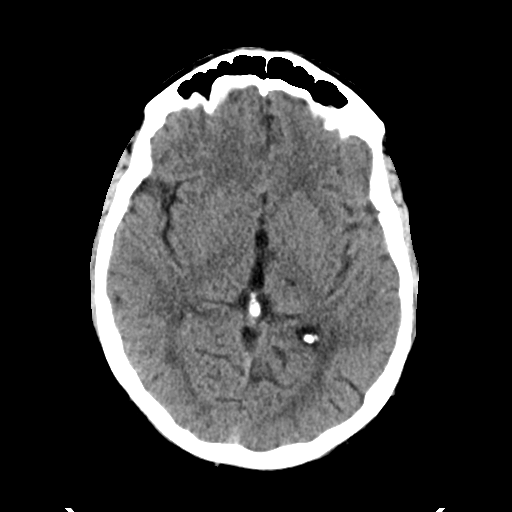
[im 16/36  bone]
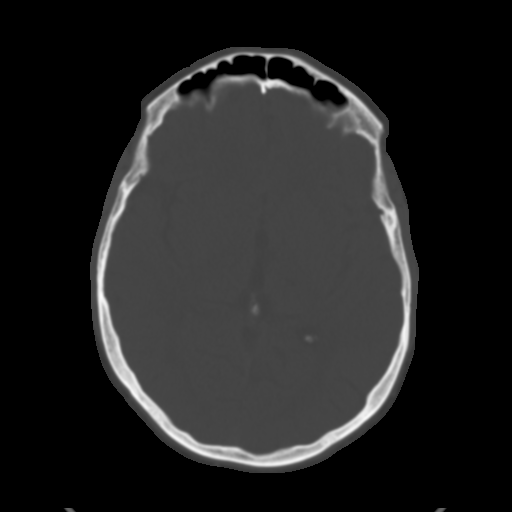
[im 20/36  brain]
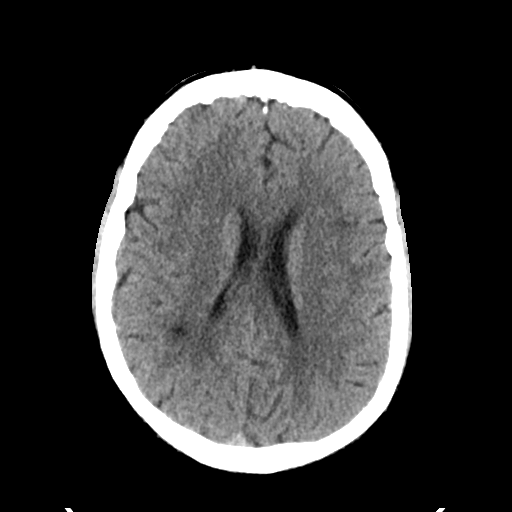
[im 23/36  brain]
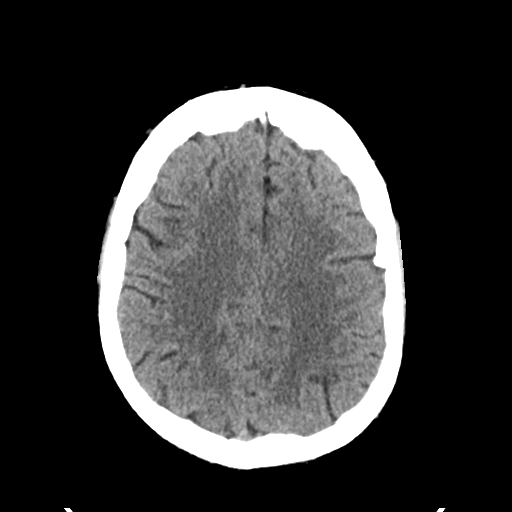
[im 27/36  brain]
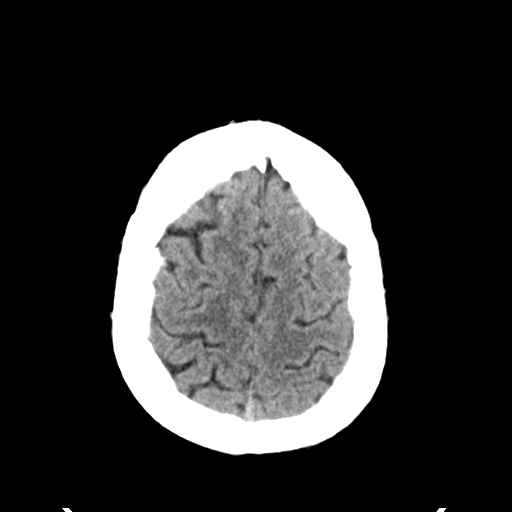
[im 29/36  brain]
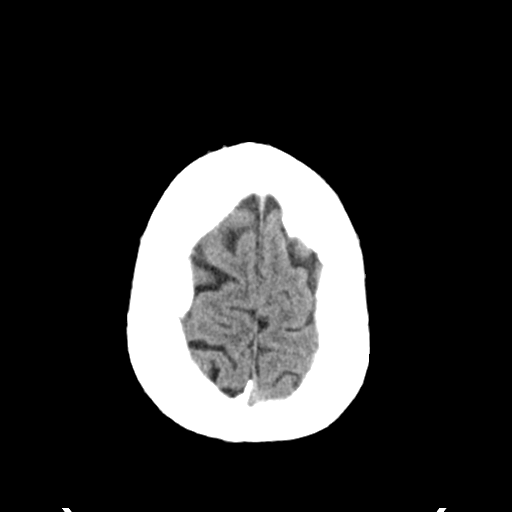
[im 29/36  bone]
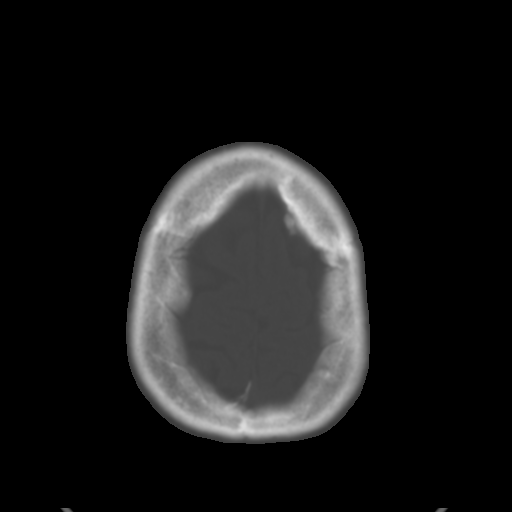
[im 33/36  brain]
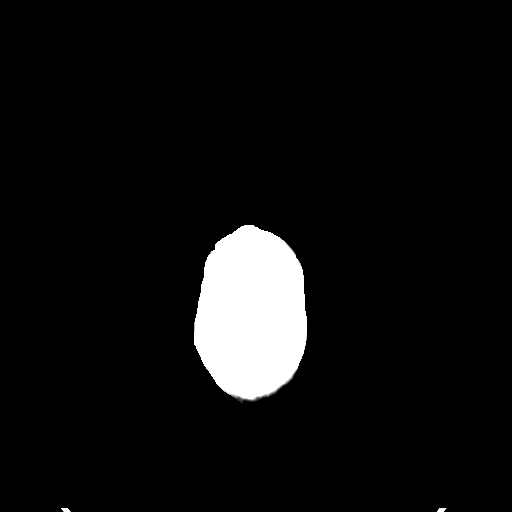

[Series 4: coronal soft tissue · coronal · 0.37mm/px · 3 of 82 slices shown]
[im 28/82  brain]
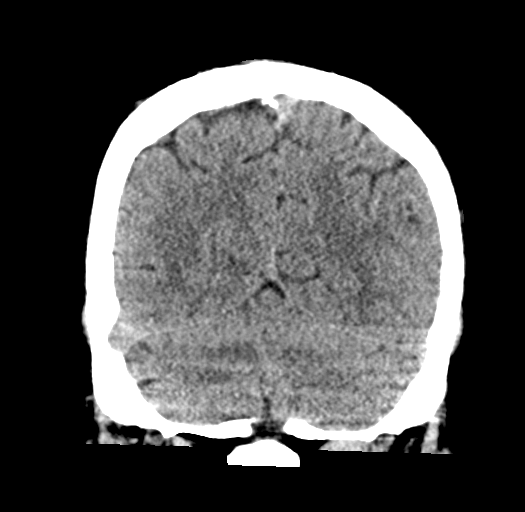
[im 37/82  brain]
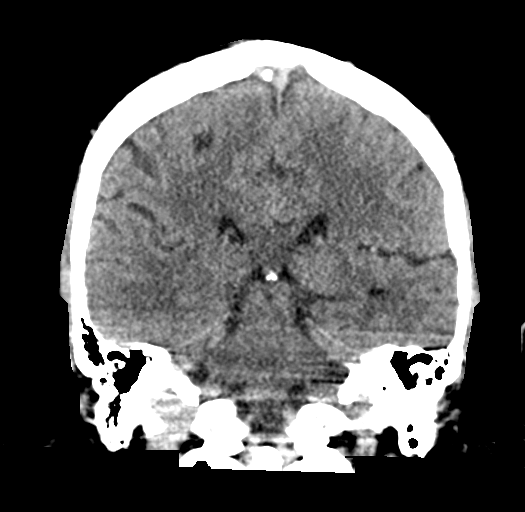
[im 46/82  brain]
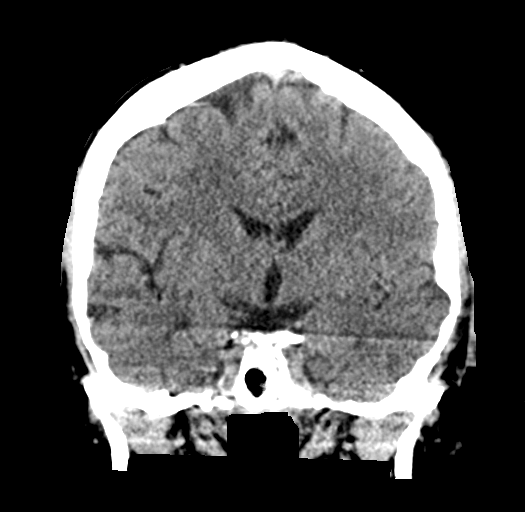

[Series 5: sagittal soft tissue · sagittal · 0.36mm/px · 3 of 68 slices shown]
[im 23/68  brain]
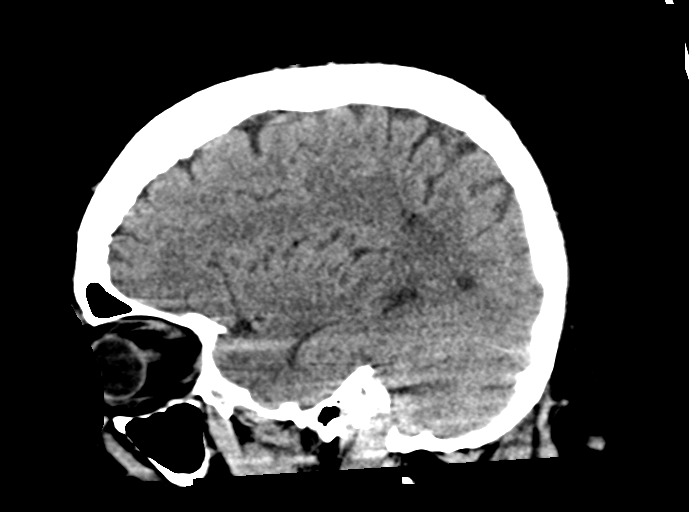
[im 34/68  brain]
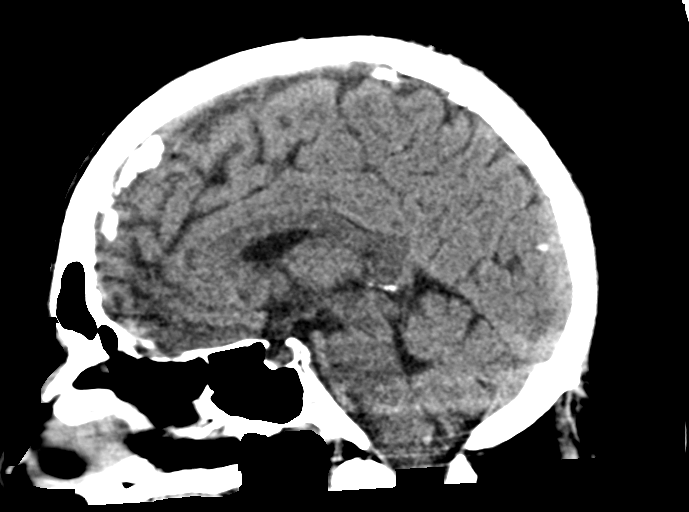
[im 45/68  brain]
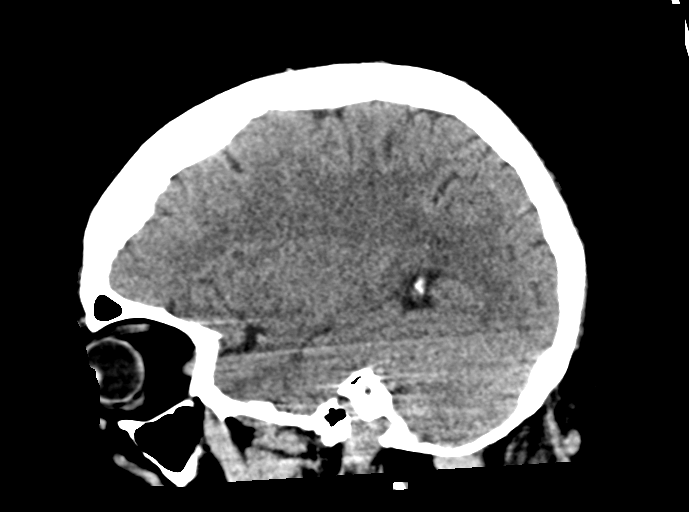

[16 of 47 positions shown; findings below may reference images not displayed]

FINDINGS: Brain: No evidence of acute infarction, hemorrhage, hydrocephalus,
extra-axial collection or mass lesion/mass effect. Periventricular
and deep white matter hypodensity, with a focal area in the deep
white matter of the right parietal lobe, unchanged compared to prior
examination (series 2, image 20).

Vascular: No hyperdense vessel or unexpected calcification.

Skull: Normal. Negative for fracture or focal lesion.

Sinuses/Orbits: No acute finding.

Other: None.
IMPRESSION: 1. No acute intracranial pathology.
2. Small-vessel white matter disease, unchanged compared to prior
examination.

## 2022-02-27 ENCOUNTER — Ambulatory Visit (INDEPENDENT_AMBULATORY_CARE_PROVIDER_SITE_OTHER): Payer: BC Managed Care – PPO | Admitting: Neurology

## 2022-02-27 ENCOUNTER — Encounter: Payer: Self-pay | Admitting: Neurology

## 2022-02-27 VITALS — BP 131/80 | HR 92 | Ht 70.0 in | Wt 308.0 lb

## 2022-02-27 DIAGNOSIS — I69354 Hemiplegia and hemiparesis following cerebral infarction affecting left non-dominant side: Secondary | ICD-10-CM

## 2022-02-27 DIAGNOSIS — Z9189 Other specified personal risk factors, not elsewhere classified: Secondary | ICD-10-CM

## 2022-02-27 DIAGNOSIS — I610 Nontraumatic intracerebral hemorrhage in hemisphere, subcortical: Secondary | ICD-10-CM

## 2022-02-27 DIAGNOSIS — I6381 Other cerebral infarction due to occlusion or stenosis of small artery: Secondary | ICD-10-CM | POA: Diagnosis not present

## 2022-02-27 NOTE — Patient Instructions (Signed)
I had a long d/w patient and his sister about his recent strokes and intracerebral hemorrhage, risk for recurrent stroke/TIAs, personally independently reviewed imaging studies and stroke evaluation results and answered questions.Continue aspirin 81 mg daily  for secondary stroke prevention and maintain strict control of hypertension with blood pressure goal below 130/90, diabetes with hemoglobin A1c goal below 6.5% and lipids with LDL cholesterol goal below 70 mg/dL. I also advised the patient to eat a healthy diet with plenty of whole grains, cereals, fruits and vegetables, exercise regularly and maintain ideal body weight .  Continue ongoing: Occupational therapy.  Referral for polysomnogram for sleep apnea.  Followup in the future with me in 6 months or call earlier if necessary.  Stroke Prevention Some medical conditions and behaviors can lead to a higher chance of having a stroke. You can help prevent a stroke by eating healthy, exercising, not smoking, and managing any medical conditions you have. Stroke is a leading cause of functional impairment. Primary prevention is particularly important because a majority of strokes are first-time events. Stroke changes the lives of not only those who experience a stroke but also their family and other caregivers. How can this condition affect me? A stroke is a medical emergency and should be treated right away. A stroke can lead to brain damage and can sometimes be life-threatening. If a person gets medical treatment right away, there is a better chance of surviving and recovering from a stroke. What can increase my risk? The following medical conditions may increase your risk of a stroke: Cardiovascular disease. High blood pressure (hypertension). Diabetes. High cholesterol. Sickle cell disease. Blood clotting disorders (hypercoagulable state). Obesity. Sleep disorders (obstructive sleep apnea). Other risk factors include: Being older than age  1. Having a history of blood clots, stroke, or mini-stroke (transient ischemic attack, TIA). Genetic factors, such as race, ethnicity, or a family history of stroke. Smoking cigarettes or using other tobacco products. Taking birth control pills, especially if you also use tobacco. Heavy use of alcohol or drugs, especially cocaine and methamphetamine. Physical inactivity. What actions can I take to prevent this? Manage your health conditions High cholesterol levels. Eating a healthy diet is important for preventing high cholesterol. If cholesterol cannot be managed through diet alone, you may need to take medicines. Take any prescribed medicines to control your cholesterol as told by your health care provider. Hypertension. To reduce your risk of stroke, try to keep your blood pressure below 130/80. Eating a healthy diet and exercising regularly are important for controlling blood pressure. If these steps are not enough to manage your blood pressure, you may need to take medicines. Take any prescribed medicines to control hypertension as told by your health care provider. Ask your health care provider if you should monitor your blood pressure at home. Have your blood pressure checked every year, even if your blood pressure is normal. Blood pressure increases with age and some medical conditions. Diabetes. Eating a healthy diet and exercising regularly are important parts of managing your blood sugar (glucose). If your blood sugar cannot be managed through diet and exercise, you may need to take medicines. Take any prescribed medicines to control your diabetes as told by your health care provider. Get evaluated for obstructive sleep apnea. Talk to your health care provider about getting a sleep evaluation if you snore a lot or have excessive sleepiness. Make sure that any other medical conditions you have, such as atrial fibrillation or atherosclerosis, are managed. Nutrition Follow  instructions from  your health care provider about what to eat or drink to help manage your health condition. These instructions may include: Reducing your daily calorie intake. Limiting how much salt (sodium) you use to 1,500 milligrams (mg) each day. Using only healthy fats for cooking, such as olive oil, canola oil, or sunflower oil. Eating healthy foods. You can do this by: Choosing foods that are high in fiber, such as whole grains, and fresh fruits and vegetables. Eating at least 5 servings of fruits and vegetables a day. Try to fill one-half of your plate with fruits and vegetables at each meal. Choosing lean protein foods, such as lean cuts of meat, poultry without skin, fish, tofu, beans, and nuts. Eating low-fat dairy products. Avoiding foods that are high in sodium. This can help lower blood pressure. Avoiding foods that have saturated fat, trans fat, and cholesterol. This can help prevent high cholesterol. Avoiding processed and prepared foods. Counting your daily carbohydrate intake.  Lifestyle If you drink alcohol: Limit how much you have to: 0-1 drink a day for women who are not pregnant. 0-2 drinks a day for men. Know how much alcohol is in your drink. In the [place] one drink equals one 12 oz bottle of beer (361mL), one 5 oz glass of wine (173mL), or one 1 oz glass of hard liquor (62mL). Do not use any products that contain nicotine or tobacco. These products include cigarettes, chewing tobacco, and vaping devices, such as e-cigarettes. If you need help quitting, ask your health care provider. Avoid secondhand smoke. Do not use drugs. Activity  Try to stay at a healthy weight. Get at least 30 minutes of exercise on most days, such as: Fast walking. Biking. Swimming. Medicines Take over-the-counter and prescription medicines only as told by your health care provider. Aspirin or blood thinners (antiplatelets or anticoagulants) may be recommended to reduce your risk of  forming blood clots that can lead to stroke. Avoid taking birth control pills. Talk to your health care provider about the risks of taking birth control pills if: You are over 33 years old. You smoke. You get very bad headaches. You have had a blood clot. Where to find more information American Stroke Association: www.strokeassociation.org Get help right away if: You or a loved one has any symptoms of a stroke. "BE FAST" is an easy way to remember the main warning signs of a stroke: B - Balance. Signs are dizziness, sudden trouble walking, or loss of balance. E - Eyes. Signs are trouble seeing or a sudden change in vision. F - Face. Signs are sudden weakness or numbness of the face, or the face or eyelid drooping on one side. A - Arms. Signs are weakness or numbness in an arm. This happens suddenly and usually on one side of the body. S - Speech. Signs are sudden trouble speaking, slurred speech, or trouble understanding what people say. T - Time. Time to call emergency services. Write down what time symptoms started. You or a loved one has other signs of a stroke, such as: A sudden, severe headache with no known cause. Nausea or vomiting. Seizure. These symptoms may represent a serious problem that is an emergency. Do not wait to see if the symptoms will go away. Get medical help right away. Call your local emergency services (911 in the [place] Do not drive yourself to the hospital. Summary You can help to prevent a stroke by eating healthy, exercising, not smoking, limiting alcohol intake, and managing any medical conditions you may  have. Do not use any products that contain nicotine or tobacco. These include cigarettes, chewing tobacco, and vaping devices, such as e-cigarettes. If you need help quitting, ask your health care provider. Remember "BE FAST" for warning signs of a stroke. Get help right away if you or a loved one has any of these signs. This information is not intended to  replace advice given to you by your health care provider. Make sure you discuss any questions you have with your health care provider. Document Revised: 10/18/2019 Document Reviewed: 10/18/2019 Elsevier Patient Education  2023 [employer].

## 2022-02-27 NOTE — Progress Notes (Signed)
[facility] [address] [place]. [place] [address] [phone]       OFFICE CONSULT NOTE  [name] Date of Birth:  1970/09/18 Medical Record Number:  [id number]   Referring MD: Thad Ranger   Reason for Referral: Stroke  HPI: Jeremiah Vance is a 51 year old Caucasian male seen today for initial office consultation visit for intracerebral hemorrhage and lacunar strokes.  History is obtained from the patient and review of electronic medical records and I personally reviewed pertinent available imaging films in PACS.Jeremiah Vance is a 51 y.o. male with a hx of medication noncompliance, HTN, morbid obesity, chronic systolic CHF, HFimpEF, poorly controlled diabetes Type 2, stroke x3, ICH August 2023, OSA noncompliant with CPAP, chronic lower extremity edema, and CKD stage III Admitted in August 2023 for headache, dizziness, and slurred speech. CT head showed small acute hemorrhage. BP in the ER was high and he was started on nicardipine infusion with improvement of BP. He was transitioned to irbesartan, Coreg and Hydralazine. UDS positive for tricyclic antidepressant. ASA was started at d/c. Echo showed LVEF 30-35%, G1DD, He was diuresed and sent home on torsemide 40mg  daily. LHC was not performed during admission due to CKD and small ICH.  He went to the ED 11/29/21 for chest pain. Vitals were normal. HS troponin was minimally elevated to 44. Ddimer was elevated, CT chest was negative. He was offered admission, but declined.   Seen 12/17/21 and reported dizziness. BP was low and Hydralazine was stopped. For reduced EF, a Myoview Lexsican was ordered.Follow-up labs showed worsening Scr/BUN and irbesartan was stopped, and hydralazine was restarted.  Myoview Lexiscan showed normal perfusion without significant ischemia or scar, normal LVEF, minimal coronary artery calcification, overall low risk.  He was admitted on 01/16/2022 for CVA.presented to the ER for evaluation after  his family noted him to be having speech difficulties somewhere around 4 PM yesterday. No clear last known well was discernible-Per the ER notes, it was 6:30 AM on 01/15/2022 presumably his family was trying to reach him but he would not respond and when they checked on him, he appeared confused and was unable to talk normally. He also reports worsening of the left arm weakness which has been present since the last stroke in August . MRI of the brain showed new acute ischemic infarcts up to 6.6 cm each involving the subcortical left frontal and posterior right frontal lobe, no associated hemorrhage or mass effect.  Multiple scattered chronic hemorrhages distribution most consistent with chronic poorly controlled hypertension.  Neurology was consulted.  MRA of the head showed no emergent LVO, posterior circulation atherosclerosis with severe left P1 and P3 stenosis compared to prior CTA, no aneurysm.  Carotid ultrasound showed no significant stenosis in the anterior circulation, antegrade vertebral flow bilaterally.Echo 01/16/22 showed LVEF 55-60%, no WMA, mild LVH, G1DD, normal RVSF, mild dilation of the ascending aorta, no shunt. He was discharged with ASA given ICH.  The patient was discharged with a 30-day heart monitor.  Patient states he has made some improvement but still has some occasional word finding difficulties.  His swallowing function has improved though he has a swallow test scheduled for a few weeks later.  Speech is much improved.  Still has some weakness in the left side and grip as well as in his left leg while walking.  Can walk independently but uses a walker for long distances.  His blood pressure is under good control today it is 131/80.  States his  sugars are doing much better.  Currently getting home physical and occupational therapy 2 days a week.  ROS:   14 system review of systems is positive for weakness, numbness, imbalance, walking difficulty, poor vision and all other systems  negative  PMH:  Past Medical History:  Diagnosis Date   Acute ischemic stroke (HCC) 09/06/2017   Acute renal failure superimposed on stage 3a chronic kidney disease (HCC) 07/08/2019   Acute right-sided weakness    Allergies    Anasarca    Anemia 07/10/2017   Arthritis    CHF (congestive heart failure) (HCC)    "coinsided w/kidney problems I was having 06/2017"   Chicken pox    CKD (chronic kidney disease) stage 3, GFR 30-59 ml/min (HCC) 06/2017   Depression    Elevated troponin 07/08/2019   High cholesterol    History of cardiomyopathy    LVEF 40 to 45% in April 2019 - subsequently normalized   Hyperbilirubinemia 07/10/2017   Hypertension    Ischemic stroke (HCC)    Small left internal capsule infarct due to lacunar disease   Morbid obesity (HCC)    Normocytic anemia 12/16/2017   Recurrent incisional hernia with incarceration s/p repair 10/22/2017 10/21/2017   Stroke (HCC) 08/2017   "right sided weakness since; getting stronger though" (10/22/2017)   Type 2 diabetes mellitus (HCC)     Social History:  Social History   Socioeconomic History   Marital status: Divorced    Spouse name: Not on file   Number of children: Not on file   Years of education: Not on file   Highest education level: Not on file  Occupational History   Not on file  Tobacco Use   Smoking status: Never   Smokeless tobacco: Never  Vaping Use   Vaping Use: Never used  Substance and Sexual Activity   Alcohol use: Yes    Comment: occasional beer   Drug use: Never   Sexual activity: Not Currently  Other Topics Concern   Not on file  Social History Narrative   He lives with his sister , [name] and she is his MPOA after his stokes   Social Determinants of Health   Financial Resource Strain: High Risk (03/20/2020)   Overall Financial Resource Strain (CARDIA)    Difficulty of Paying Living Expenses: Hard  Food Insecurity: No Food Insecurity (01/16/2022)   Hunger Vital Sign    Worried About Running Out  of Food in the Last Year: Never true    Ran Out of Food in the Last Year: Never true  Transportation Needs: Unmet Transportation Needs (01/16/2022)   PRAPARE - Administrator, Civil Service (Medical): Yes    Lack of Transportation (Non-Medical): Yes  Physical Activity: Not on file  Stress: Not on file  Social Connections: Not on file  Intimate Partner Violence: Not At Risk (01/16/2022)   Humiliation, Afraid, Rape, and Kick questionnaire    Fear of Current or Ex-Partner: No    Emotionally Abused: No    Physically Abused: No    Sexually Abused: No    Medications:   Current Outpatient Medications on File Prior to Visit  Medication Sig Dispense Refill   aspirin EC 81 MG tablet Take 1 tablet (81 mg total) by mouth daily. Swallow whole. 30 tablet 12   atorvastatin (LIPITOR) 40 MG tablet Take 1 tablet (40 mg total) by mouth daily at 6 PM. 90 tablet 3   carvedilol (COREG) 12.5 MG tablet Take 1 tablet (12.5 mg  total) by mouth 2 (two) times daily with a meal. 60 tablet 3   dapagliflozin propanediol (FARXIGA) 10 MG TABS tablet Take 1 tablet (10 mg) by mouth once daily 30 tablet 6   glimepiride (AMARYL) 4 MG tablet Take 1 tablet (4 mg total) by mouth daily with breakfast. 30 tablet 3   hydrALAZINE (APRESOLINE) 50 MG tablet Take 1 tablet (50 mg total) by mouth in the morning and at bedtime. 60 tablet 5   irbesartan (AVAPRO) 300 MG tablet Take 300 mg by mouth daily.     LANTUS 100 UNIT/ML injection SMARTSIG:15 Unit(s) SUB-Q Daily     linagliptin (TRADJENTA) 5 MG TABS tablet Take 1 tablet (5 mg total) by mouth daily. 30 tablet 3   torsemide (DEMADEX) 20 MG tablet Take 2 tablets (40 mg total) by mouth daily. 90 tablet 3   No current facility-administered medications on file prior to visit.    Allergies:   Allergies  Allergen Reactions   Pollen Extract Other (See Comments)   Sulfa Antibiotics     Physical Exam General: Morbidly obese pleasant middle-age Caucasian male, seated, in  no evident distress Head: head normocephalic and atraumatic.   Neck: supple with no carotid or supraclavicular bruits Cardiovascular: regular rate and rhythm, no murmurs Musculoskeletal: no deformity Skin:  no rash/petichiae   Heart monitor left infraclavicular region. Vascular:  Normal pulses all extremities  Neurologic Exam Mental Status: Awake and fully alert. Oriented to place and time. Recent and remote memory intact. Attention span, concentration and fund of knowledge appropriate. Mood and affect appropriate.  Cranial Nerves: Fundoscopic exam not done pupils equal, irregular and sluggishly reactive to light.  Patient is legally blind with left eye only motion detection and right eye finger counting at 2 feet extraocular movements full without nystagmus. Visual fields full to confrontation. Hearing intact. Facial sensation intact. Face, tongue, palate moves normally and symmetrically.  Motor: Mild spastic left hemiparesis with 4/5 strength with weakness of left grip intrinsic hand muscles as well as left ankle dorsiflexors and hip flexors.  Tone is increased on the left compared to the right.. Sensory.: intact to touch , pinprick , position and vibratory sensation.  Coordination: Rapid alternating movements normal in all extremities. Finger-to-nose and heel-to-shin performed accurately bilaterally. Gait and Station: Arises from chair without difficulty. Stance is broad-based.  Walks with some dragging of the left leg.  Unsteady on a narrow base and by standing on either foot unsupported  Reflexes: 1+ and symmetric and ankle jerks are depressed bilaterally. Toes downgoing.   NIHSS  2 Modified Rankin  3   ASSESSMENT: 51 year old Caucasian male with right basal ganglia intracerebral hemorrhage in August 2023 as well as bilateral lacunar infarcts in October 2023 due to small vessel disease.  Vascular risk factors of diabetes, hypertension, hyperlipidemia, obesity, congestive heart failure  and at risk for sleep apnea     PLAN:I had a long d/w patient and his sister about his recent strokes and intracerebral hemorrhage, risk for recurrent stroke/TIAs, personally independently reviewed imaging studies and stroke evaluation results and answered questions.Continue aspirin 81 mg daily  for secondary stroke prevention and maintain strict control of hypertension with blood pressure goal below 130/90, diabetes with hemoglobin A1c goal below 6.5% and lipids with LDL cholesterol goal below 70 mg/dL. I also advised the patient to eat a healthy diet with plenty of whole grains, cereals, fruits and vegetables, exercise regularly and maintain ideal body weight .  Continue ongoing: Occupational therapy.  Referral for polysomnogram  for sleep apnea.  Followup in the future with me in 6 months or call earlier if necessary.  Greater than 50% time during this 45-minute consultation visit was spent on counseling and coordination of care about his recent intracerebral hemorrhage lacunar strokes and answering questions.  Delia Heady, MD Note: This document was prepared with digital dictation and possible smart phrase technology. Any transcriptional errors that result from this process are unintentional.

## 2022-03-05 ENCOUNTER — Ambulatory Visit: Payer: BC Managed Care – PPO | Admitting: Podiatry

## 2022-03-05 ENCOUNTER — Encounter: Payer: Self-pay | Admitting: Podiatry

## 2022-03-05 VITALS — BP 157/95 | HR 87

## 2022-03-05 DIAGNOSIS — B351 Tinea unguium: Secondary | ICD-10-CM

## 2022-03-05 DIAGNOSIS — M79675 Pain in left toe(s): Secondary | ICD-10-CM

## 2022-03-05 DIAGNOSIS — M79674 Pain in right toe(s): Secondary | ICD-10-CM | POA: Diagnosis not present

## 2022-03-05 NOTE — Progress Notes (Signed)
   SUBJECTIVE Patient with a history of diabetes mellitus presents to office today complaining of elongated, thickened nails that cause pain while ambulating in shoes.  He is unable to trim his own nails. Patient is here for further evaluation and treatment.   Past Medical History:  Diagnosis Date   Acute ischemic stroke (HCC) 09/06/2017   Acute renal failure superimposed on stage 3a chronic kidney disease (HCC) 07/08/2019   Acute right-sided weakness    Allergies    Anasarca    Anemia 07/10/2017   Arthritis    CHF (congestive heart failure) (HCC)    "coinsided w/kidney problems I was having 06/2017"   Chicken pox    CKD (chronic kidney disease) stage 3, GFR 30-59 ml/min (HCC) 06/2017   Depression    Elevated troponin 07/08/2019   High cholesterol    History of cardiomyopathy    LVEF 40 to 45% in April 2019 - subsequently normalized   Hyperbilirubinemia 07/10/2017   Hypertension    Ischemic stroke (HCC)    Small left internal capsule infarct due to lacunar disease   Morbid obesity (HCC)    Normocytic anemia 12/16/2017   Recurrent incisional hernia with incarceration s/p repair 10/22/2017 10/21/2017   Stroke (HCC) 08/2017   "right sided weakness since; getting stronger though" (10/22/2017)   Type 2 diabetes mellitus (HCC)     OBJECTIVE General Patient is awake, alert, and oriented x 3 and in no acute distress. Derm Skin is dry and supple bilateral. Negative open lesions or macerations. Remaining integument unremarkable. Nails are tender, long, thickened and dystrophic with subungual debris, consistent with onychomycosis, 1-5 bilateral. No signs of infection noted.  Hyperkeratotic preulcerative calluses noted to the bilateral toes Vasc  DP and PT pedal pulses palpable bilaterally. Temperature gradient within normal limits.  Neuro Epicritic and protective threshold sensation diminished bilaterally.  Musculoskeletal Exam No symptomatic pedal deformities noted bilateral. Muscular strength  within normal limits.  ASSESSMENT 1. Diabetes Mellitus w/ peripheral neuropathy 2. Onychomycosis of nail due to dermatophyte bilateral 3.  Preulcerative callus lesions bilateral feet  4.  Pain in foot bilateral  PLAN OF CARE 1. Patient evaluated today. 2. Instructed to maintain good pedal hygiene and foot care. Stressed importance of controlling blood sugar.  3. Mechanical debridement of nails 1-5 bilaterally performed using a nail nipper. Filed with dremel without incident.  4.  Excisional debridement of the hyperkeratotic callus tissue was performed using a tissue nipper without incident or bleeding  5.  Return to clinic in 3 mos.     Felecia Shelling, DPM [facility]  Dr. Felecia Shelling, DPM    [address] [place], [place] [address]                Office [phone]  Fax [phone]

## 2022-03-07 ENCOUNTER — Other Ambulatory Visit: Payer: Self-pay

## 2022-03-07 ENCOUNTER — Emergency Department
Admission: EM | Admit: 2022-03-07 | Discharge: 2022-03-07 | Disposition: A | Discharge status: Home / Self Care | Payer: BC Managed Care – PPO | Attending: Emergency Medicine | Admitting: Emergency Medicine

## 2022-03-07 DIAGNOSIS — E162 Hypoglycemia, unspecified: Secondary | ICD-10-CM | POA: Diagnosis present

## 2022-03-07 DIAGNOSIS — I509 Heart failure, unspecified: Secondary | ICD-10-CM | POA: Diagnosis not present

## 2022-03-07 DIAGNOSIS — E1122 Type 2 diabetes mellitus with diabetic chronic kidney disease: Secondary | ICD-10-CM | POA: Insufficient documentation

## 2022-03-07 DIAGNOSIS — N189 Chronic kidney disease, unspecified: Secondary | ICD-10-CM | POA: Insufficient documentation

## 2022-03-07 DIAGNOSIS — R630 Anorexia: Secondary | ICD-10-CM | POA: Insufficient documentation

## 2022-03-07 LAB — BASIC METABOLIC PANEL
Anion gap: 9 (ref 5–15)
BUN: 19 mg/dL (ref 6–20)
CO2: 28 mmol/L (ref 22–32)
Calcium: 8.9 mg/dL (ref 8.9–10.3)
Chloride: 102 mmol/L (ref 98–111)
Creatinine, Ser: 1.93 mg/dL — ABNORMAL HIGH (ref 0.61–1.24)
GFR, Estimated: 41 mL/min — ABNORMAL LOW (ref >60)
Glucose, Bld: 127 mg/dL — ABNORMAL HIGH (ref 70–99)
Potassium: 3.5 mmol/L (ref 3.5–5.1)
Sodium: 139 mmol/L (ref 135–145)

## 2022-03-07 LAB — CBC WITH DIFFERENTIAL/PLATELET
Abs Immature Granulocytes: 0.03 10*3/uL (ref 0.00–0.07)
Basophils Absolute: 0.1 10*3/uL (ref 0.0–0.1)
Basophils Relative: 1 %
Eosinophils Absolute: 0.2 10*3/uL (ref 0.0–0.5)
Eosinophils Relative: 2 %
HCT: 41.6 % (ref 39.0–52.0)
Hemoglobin: 13 g/dL (ref 13.0–17.0)
Immature Granulocytes: 0 %
Lymphocytes Relative: 18 %
Lymphs Abs: 1.8 10*3/uL (ref 0.7–4.0)
MCH: 27 pg (ref 26.0–34.0)
MCHC: 31.3 g/dL (ref 30.0–36.0)
MCV: 86.5 fL (ref 80.0–100.0)
Monocytes Absolute: 0.7 10*3/uL (ref 0.1–1.0)
Monocytes Relative: 7 %
Neutro Abs: 7.1 10*3/uL (ref 1.7–7.7)
Neutrophils Relative %: 72 %
Platelets: 412 10*3/uL — ABNORMAL HIGH (ref 150–400)
RBC: 4.81 MIL/uL (ref 4.22–5.81)
RDW: 14 % (ref 11.5–15.5)
WBC: 9.8 10*3/uL (ref 4.0–10.5)
nRBC: 0 % (ref 0.0–0.2)

## 2022-03-07 LAB — CBG MONITORING, ED
Glucose-Capillary: 89 mg/dL (ref 70–99)
Glucose-Capillary: 73 mg/dL (ref 70–99)
Glucose-Capillary: 156 mg/dL — ABNORMAL HIGH (ref 70–99)
Glucose-Capillary: 153 mg/dL — ABNORMAL HIGH (ref 70–99)

## 2022-03-07 NOTE — Discharge Instructions (Signed)
Check blood sugars as we discussed tonight and return to the emergency department if it runs low again.  Tomorrow morning talk with the diabetes about medication management moving forward.  Do not take any of your medications until you are able to discuss with the diabetes doctor.  Thank you for choosing [place] for your health care today!  Please see your primary doctor this week for a follow up appointment.   Sometimes, in the early stages of certain disease courses it is difficult to detect in the emergency department evaluation -- so, it is important that you continue to monitor your symptoms and call your doctor right away or return to the emergency department if you develop any new or worsening symptoms.  Please go to the following website to schedule new (and existing) patient appointments:   [email or web address]  If you do not have a primary doctor try calling the following clinics to establish care:  If you have insurance:  [facility] [phone] [address] [place] [place] [address]   [facility]  [phone] [address] [place] [place] [address]   If you do not have insurance:  [facility]  [phone] [address] [place] [address]   The following is another list of primary care offices in the area who are accepting new patients at this time.  Please reach out to one of them directly and let them know you would like to schedule an appointment to follow up on an Emergency Department visit, and/or to establish a new primary care provider (PCP).  There are likely other primary care clinics in the are who are accepting new patients, but this is an excellent place to start:  [facility] Lead physician: Dr Shirlee Latch [address] [place], [place] [address] [phone]  [facility] Lead Physician: Dr Alba Cory [address],  [place], [place] [address] [phone]  [facility]  Lead Physician: Dr Olevia Perches [address] [place], [place] [address] [phone]  [facility] Lead Physician: Dr Sofie Hartigan [address] [place], [place], [place] [address] [phone]  [facility] Lead Physician: Dr Bari Edward [address] [place], [place] [address] [phone]   It was my pleasure to care for you today.   Daneil Dan Modesto Charon, MD

## 2022-03-07 NOTE — ED Provider Notes (Signed)
[facility] Provider Note    Event Date/Time   First MD Initiated Contact with Patient 03/07/22 1913     (approximate)   History   Hypoglycemia   HPI  Jeremiah Vance is a 51 y.o. male   Past medical history of prior stroke, CKD, CHF, depression, type 2 diabetes on insulin presents with hypoglycemia.  He was reportedly found with altered mental status and minimally responsive by his sister and blood sugar was reportedly 50 with good response to D10 given by EMS.  He arrives awake alert without any medical complaints other than chronic knee pain.  He states that he has been in his regular state of health and has had a decreased appetite which he thinks led to his low blood sugar.  He says that his sister manages all of his medications for him.  He has had no recent changes in his medications as far as he knows.  Denies abdominal pain, nausea, vomiting.  He states that his sister fills his insulin syringes for him and he self administers.  He denies overdosing on any medications.  He denies any ingestions.  History was obtained via the patient       Physical Exam   Triage Vital Signs: ED Triage Vitals  Enc Vitals Group     BP 03/07/22 1913 (!) 140/111     Pulse Rate 03/07/22 1913 76     Resp 03/07/22 1913 15     Temp 03/07/22 1918 97.9 F (36.6 C)     Temp src --      SpO2 03/07/22 1913 100 %     Weight 03/07/22 1910 (!) 308 lb (139.7 kg)     Height 03/07/22 1910 5\' 10"  (1.778 m)     Head Circumference --      Peak Flow --      Pain Score 03/07/22 1910 0     Pain Loc --      Pain Edu? --      Excl. in GC? --     Most recent vital signs: Vitals:   03/07/22 1919 03/07/22 2145  BP: 130/77 124/70  Pulse: 85 90  Resp: 14 16  Temp: 97.9 F (36.6 C)   SpO2: 100% 97%    General: Awake, no distress.  CV:  Good peripheral perfusion.  Resp:  Normal effort.  Abd:  No distention.  Other:  Overall well-appearing in no acute distress alert and  oriented and cooperative.  Hemodynamics are appropriate and reassuring his lungs sound clear to auscultation bilaterally and his abdomen is soft and nontender he is moving all extremities with full active range of motion.   ED Results / Procedures / Treatments   Labs (all labs ordered are listed, but only abnormal results are displayed) Labs Reviewed  BASIC METABOLIC PANEL - Abnormal; Notable for the following components:      Result Value   Glucose, Bld 127 (*)    Creatinine, Ser 1.93 (*)    GFR, Estimated 41 (*)    All other components within normal limits  CBC WITH DIFFERENTIAL/PLATELET - Abnormal; Notable for the following components:   Platelets 412 (*)    All other components within normal limits  CBG MONITORING, ED - Abnormal; Notable for the following components:   Glucose-Capillary 156 (*)    All other components within normal limits  CBG MONITORING, ED - Abnormal; Notable for the following components:   Glucose-Capillary 153 (*)    All other components  within normal limits  CBG MONITORING, ED  CBG MONITORING, ED     I reviewed labs and they are notable for glucose is 127.   PROCEDURES:  Critical Care performed: No  Procedures   MEDICATIONS ORDERED IN ED: Medications - No data to display  IMPRESSION / MDM / ASSESSMENT AND PLAN / ED COURSE  I reviewed the triage vital signs and the nursing notes.                              Differential diagnosis includes, but is not limited to, hypoglycemia, infection, overdose, metabolic derangement    MDM: This is a patient who is insulin-dependent and also on other diabetic medications that presents with hypoglycemia in the setting of poor p.o. intake.  He denies any other medical complaints and otherwise appears well with a exam which is unremarkable today.  He is eating in the emergency department and we will continue to check his blood glucose and closely monitor him while here and also attempt to talk with his sister  about medications at home and ensure adequate follow-up and at home observation.  Patient has been stable in the emergency department and tolerating p.o. intake with recheck blood sugars 150 and 150 afterwards.  He is sister unsure when his last oral diabetic medication was taken, though there is concern because some of his medications are long-acting.  It was either taken yesterday morning or this morning.  Discussion with the patient and sister since he is asymptomatic at this time and has had repeat blood sugar checks in the 150s, the plan will be that he will refrain from taking any more of his diabetes medications until he is able to talk with his diabetes doctor in the morning for further management.  His sister will check his blood sugar at 2-hour intervals through the night tonigh and return if he becomes hypoglycemic again.   Patient's presentation is most consistent with acute presentation with potential threat to life or bodily function.       FINAL CLINICAL IMPRESSION(S) / ED DIAGNOSES   Final diagnoses:  Hypoglycemia     Rx / DC Orders   ED Discharge Orders     None        Note:  This document was prepared using Dragon voice recognition software and may include unintentional dictation errors.    Pilar Jarvis, MD 03/07/22 [phone]

## 2022-03-07 NOTE — ED Triage Notes (Signed)
Pt states medical management, including insulin administration, is managed by pt's sister.

## 2022-03-07 NOTE — ED Notes (Signed)
Pt eating meal tray at this time. Pt awake and alert at this time.

## 2022-03-07 NOTE — ED Notes (Signed)
Pt denies any complaints at present.

## 2022-03-07 NOTE — ED Triage Notes (Addendum)
Pt BIB [facility] from home d/t concerns for inability to control blood sugar. Initial EMS CBG 36, given 200 mL of D10, repeat 88. Pt arrives to ED alert and oriented. HX T2DM, manages with SubQ insulin.

## 2022-03-08 ENCOUNTER — Encounter: Payer: Self-pay | Admitting: Physician Assistant

## 2022-03-11 ENCOUNTER — Other Ambulatory Visit: Payer: Self-pay | Admitting: Physician Assistant

## 2022-03-11 DIAGNOSIS — E1165 Type 2 diabetes mellitus with hyperglycemia: Secondary | ICD-10-CM

## 2022-03-11 NOTE — Progress Notes (Unsigned)
Established patient visit   Patient: Jeremiah Vance   DOB: [birth year]   [name]  MRN: [id number] Visit Date: 03/12/2022  Today's healthcare provider: [name], PA-C  CC: DMII fu  Subjective    HPI  -Patient last day of insulin was Tuesday and Thursday morning was last day of other 2 diabetes medications Diabetes Mellitus Type II, Follow-up  Lab Results  Component Value Date   HGBA1C 5.5 03/12/2022   HGBA1C 7.0 (H) 01/16/2022   HGBA1C 10.5 (H) 11/22/2021   Wt Readings from Last 3 Encounters:  03/12/22 (!) 300 lb 3.2 oz (136.2 kg)  03/07/22 (!) 308 lb (139.7 kg)  02/27/22 (!) 308 lb (139.7 kg)   Last seen for diabetes 3 months ago.  Management since then includes Farxiga 10mg , glimepiride 4 mg, Lantus 100 units injection.  He reports fair compliance with treatment. He is having side effects. {document side effects if present:1}  Symptoms: Yes fatigue No foot ulcerations  Yes appetite changes No nausea  No paresthesia of the feet  No polydipsia  No polyuria Yes visual disturbances   Yes vomiting     Home blood sugar records: fasting range: last Wednesday 50's and Thursday night 36  Episodes of hypoglycemia? Yes {groggy}   Current insulin regiment: {enter 'none' or type of insulin and number of units taken with each dose of each insulin formulation that the patient is taking:1} Pt was seen at ED on 03/07/22 for hypoglycemia. Recommended to refrain from taking any of his diabetes medications. Pt's sister checked BS at home. His BS at home were between 100 - fasting and around 200 after a meal. Pertinent Labs: Lab Results  Component Value Date   CHOL 106 01/16/2022   HDL 24 (L) 01/16/2022   LDLCALC 61 01/16/2022   TRIG 104 01/16/2022   CHOLHDL 4.4 01/16/2022   Lab Results  Component Value Date   NA 139 03/07/2022   K 3.5 03/07/2022   CREATININE 1.93 (H) 03/07/2022   GFRNONAA 41 (L) 03/07/2022   MICROALBUR 86.0 (H) 01/18/2020      ---------------------------------------------------------------------------------------------------   Medications: Outpatient Medications Prior to Visit  Medication Sig   aspirin EC 81 MG tablet Take 1 tablet (81 mg total) by mouth daily. Swallow whole.   atorvastatin (LIPITOR) 40 MG tablet Take 1 tablet (40 mg total) by mouth daily at 6 PM.   carvedilol (COREG) 12.5 MG tablet Take 1 tablet (12.5 mg total) by mouth 2 (two) times daily with a meal.   dapagliflozin propanediol (FARXIGA) 10 MG TABS tablet Take 1 tablet (10 mg) by mouth once daily   glimepiride (AMARYL) 4 MG tablet Take 1 tablet (4 mg total) by mouth daily with breakfast.   hydrALAZINE (APRESOLINE) 50 MG tablet Take 1 tablet (50 mg total) by mouth in the morning and at bedtime.   irbesartan (AVAPRO) 300 MG tablet Take 300 mg by mouth daily.   LANTUS 100 UNIT/ML injection SMARTSIG:15 Unit(s) SUB-Q Daily   linagliptin (TRADJENTA) 5 MG TABS tablet Take 1 tablet (5 mg total) by mouth daily.   torsemide (DEMADEX) 20 MG tablet Take 2 tablets (40 mg total) by mouth daily.   No facility-administered medications prior to visit.    Review of Systems  All other systems reviewed and are negative. Except see HPI  {Labs  Heme  Chem  Endocrine  Serology  Results Review (optional):23779}   Objective    BP 136/76 (BP Location: Left Arm, Patient Position: Sitting,  Cuff Size: Large)   Pulse 87   Ht 5\' 10"  (1.778 m)   Wt (!) 300 lb 3.2 oz (136.2 kg)   SpO2 100%   BMI 43.07 kg/m  {Show previous vital signs (optional):23777}  Physical Exam Vitals reviewed.  Constitutional:      General: He is not in acute distress.    Appearance: Normal appearance. He is not diaphoretic.  HENT:     Head: Normocephalic and atraumatic.  Eyes:     General: No scleral icterus.    Conjunctiva/sclera: Conjunctivae normal.  Cardiovascular:     Rate and Rhythm: Normal rate and regular rhythm.     Pulses: Normal pulses.     Heart sounds: Normal  heart sounds. No murmur heard. Pulmonary:     Effort: Pulmonary effort is normal. No respiratory distress.     Breath sounds: Normal breath sounds. No wheezing or rhonchi.  Musculoskeletal:     Cervical back: Neck supple.     Right lower leg: No edema.     Left lower leg: No edema.  Lymphadenopathy:     Cervical: No cervical adenopathy.  Skin:    General: Skin is warm and dry.     Findings: No rash.  Neurological:     Mental Status: He is alert and oriented to person, place, and time. Mental status is at baseline.  Psychiatric:        Behavior: Behavior normal.        Thought Content: Thought content normal.        Judgment: Judgment normal.      Results for orders placed or performed in visit on 03/12/22  POCT HgB A1C  Result Value Ref Range   Hemoglobin A1C 5.5 4.0 - 5.6 %   HbA1c POC (<> result, manual entry)     HbA1c, POC (prediabetic range)     HbA1c, POC (controlled diabetic range)      Assessment & Plan     Type 2 diabetes mellitus with hyperglycemia, without long-term current use of insulin (HCC) Hypoglycemia New problem Hold on all diabetic medications Pt was provided with a free sample of Freestyle Libre for continuous blood sugar monitoring - POCT HgB A1C 5.5 Continue with healthy low-carb diet and daily exercise/OT as tolerated Will reassess in 2 weeks.  Essential hypertension Heart failure with preserved ejection fraction, unspecified HF chronicity (HCC) Hyperlipidemia, unspecified hyperlipidemia type Chronic systolic heart failure (HCC) Chronic and stable Continue with his current regimen Carvedilol 12.5 mg BID, irbesartan 300mg  daily and Farxiga 10mg  daily for HF   hydralazine 50mg , torsemide 40mg  daily Will FU with Cardiology in 6 mo Cerebrovascular accident (CVA), unspecified mechanism ([place])  Stage 3 chronic kidney disease, unspecified whether stage 3a or 3b CKD ([place])  Morbid obesity ([place])  Freestyle libre was provided for 14 days Pt was  advised to schedule a FU next week Pt's sister was advised to check a situation with sleep studies A referral for sleep studies could be placed  Per chart review from  by Cardiology Per chart review from By podiatry  The patient was advised to call back or seek an in-person evaluation if the symptoms worsen or if the condition fails to improve as anticipated.  I discussed the assessment and treatment plan with the patient. The patient was provided an opportunity to ask questions and all were answered. The patient agreed with the plan and demonstrated an understanding of the instructions.  The entirety of the information documented in the History of Present  Illness, Review of Systems and Physical Exam were personally obtained by me. Portions of this information were initially documented by the CMA and reviewed by me for thoroughness and accuracy.  Debera Lat, [facility], MMS [facility] [phone] (phone) [phone] (fax)

## 2022-03-12 ENCOUNTER — Ambulatory Visit: Payer: BC Managed Care – PPO | Admitting: Physician Assistant

## 2022-03-12 ENCOUNTER — Encounter: Payer: Self-pay | Admitting: Neurology

## 2022-03-12 VITALS — BP 136/76 | HR 87 | Ht 70.0 in | Wt 300.2 lb

## 2022-03-12 DIAGNOSIS — I5030 Unspecified diastolic (congestive) heart failure: Secondary | ICD-10-CM | POA: Diagnosis not present

## 2022-03-12 DIAGNOSIS — I10 Essential (primary) hypertension: Secondary | ICD-10-CM | POA: Diagnosis not present

## 2022-03-12 DIAGNOSIS — E1165 Type 2 diabetes mellitus with hyperglycemia: Secondary | ICD-10-CM

## 2022-03-12 DIAGNOSIS — I5022 Chronic systolic (congestive) heart failure: Secondary | ICD-10-CM

## 2022-03-12 DIAGNOSIS — E785 Hyperlipidemia, unspecified: Secondary | ICD-10-CM

## 2022-03-12 DIAGNOSIS — N183 Chronic kidney disease, stage 3 unspecified: Secondary | ICD-10-CM

## 2022-03-12 DIAGNOSIS — E162 Hypoglycemia, unspecified: Secondary | ICD-10-CM

## 2022-03-12 DIAGNOSIS — I639 Cerebral infarction, unspecified: Secondary | ICD-10-CM

## 2022-03-12 LAB — POCT GLYCOSYLATED HEMOGLOBIN (HGB A1C): Hemoglobin A1C: 5.5 % (ref 4.0–5.6)

## 2022-03-14 ENCOUNTER — Ambulatory Visit
Admission: RE | Admit: 2022-03-14 | Discharge: 2022-03-14 | Disposition: A | Discharge status: Home / Self Care | Payer: BC Managed Care – PPO | Source: Ambulatory Visit | Attending: Physician Assistant | Admitting: Physician Assistant

## 2022-03-14 DIAGNOSIS — R1310 Dysphagia, unspecified: Secondary | ICD-10-CM | POA: Diagnosis present

## 2022-03-14 NOTE — Therapy (Addendum)
[facility] DIAGNOSTIC RADIOLOGY [address] [place], [place], [address] Phone: [phone]   Fax:     Modified Barium Swallow  Patient Details  Name: Jeremiah Vance MRN: [id number] Date of Birth: 03-15-1971 No data recorded  Encounter Date: 03/14/2022   End of Session - 03/14/22 1451     Visit Number 1    Number of Visits 1    Date for SLP Re-Evaluation 03/14/22    SLP Start Time 1300    SLP Stop Time  1400    SLP Time Calculation (min) 60 min    Activity Tolerance Patient tolerated treatment well             Past Medical History:  Diagnosis Date   Acute ischemic stroke (HCC) 09/06/2017   Acute renal failure superimposed on stage 3a chronic kidney disease (HCC) 07/08/2019   Acute right-sided weakness    Allergies    Anasarca    Anemia 07/10/2017   Arthritis    CHF (congestive heart failure) (HCC)    "coinsided w/kidney problems I was having 06/2017"   Chicken pox    CKD (chronic kidney disease) stage 3, GFR 30-59 ml/min (HCC) 06/2017   Depression    Elevated troponin 07/08/2019   High cholesterol    History of cardiomyopathy    LVEF 40 to 45% in April 2019 - subsequently normalized   Hyperbilirubinemia 07/10/2017   Hypertension    Ischemic stroke (HCC)    Small left internal capsule infarct due to lacunar disease   Morbid obesity (HCC)    Normocytic anemia 12/16/2017   Recurrent incisional hernia with incarceration s/p repair 10/22/2017 10/21/2017   Stroke (HCC) 08/2017   "right sided weakness since; getting stronger though" (10/22/2017)   Type 2 diabetes mellitus (HCC)     Past Surgical History:  Procedure Laterality Date   ABDOMINAL HERNIA REPAIR  2008; 10/22/2017   "scope; OPEN REPAIR INCARCERATED VENTRAL HERNIA   HERNIA REPAIR     KNEE ARTHROSCOPY Right 1989   VENTRAL HERNIA REPAIR N/A 10/22/2017   Procedure: OPEN REPAIR INCARCERATED VENTRAL HERNIA;  Surgeon: Glenna Fellows, MD;  Location: [facility] OR;  Service: General;   Laterality: N/A;    There were no vitals filed for this visit.   Subjective: Patient behavior: (alertness, ability to follow instructions, etc.): pt alert/awake; followed instructions and answered basic questions re: self. Quite pleasant and seemed to enjoy joking around w/ this Clinician and staff members.   Chief complaint: dysphagia. When asked about his swallowing and any difficulty swallowing, pt only endorsed discomfort and being bothered by swallowing deficits "here" - pointing to his upper Sternum area. He Denied any problems swallowing liquids (no choking/coughing events when drinking liquids), chewing foods, and stated "I chew my food really well" b/f swallowing. He endorsed he had been "working on" chewing his foods more at home.  Pt denied any Pulmonary decline or recent dx/tx of pneumonia - None noted in chart/Imaging; no change in his diet nor foods/liquids consumed. Pt stated he is monitoring his DM better and losing weight.  Pt was recently admitted to [facility] 12/[facility] w/ concern for neurological changes. Per chart notes and BSE during that admit, "pt fed himself some of his Lunch meal w/ No difficulty swallowing; he is currently on a regular diet and tolerates swallowing pills w/ water per NSG.". Pt was recommended then to f/u w/ skilled outpatient ST services to address any cognitive-linguistic needs for his communication.  Pt's PMH includes: intracerebral  hemorrhage and lacunar strokes; DM, HTN, Morbid Obesity, and Large, low midline abdominal wall hernia (see Imaging 01/2020).   OM Exam: WFL w/ no unilateral lingual/labial weakness; Cough strong. Native Dentition.      Objective:  Radiological Procedure: A videoflouroscopic evaluation of oral-preparatory, reflex initiation, and pharyngeal phases of the swallow was performed; as well as a screening of the upper esophageal phase.  POSTURE: upright VIEW: lateral COMPENSATORY STRATEGIES: None BOLUSES ADMINISTERED:  Thin Liquid: 2  tsp; 5 cup  Nectar-thick Liquid: 1 tsp; 3 cup  Honey-thick Liquid: NT  Puree: 1  Mechanical Soft: 1 RESULTS OF EVALUATION: ORAL PREPARATORY PHASE: (The lips, tongue, and velum are observed for strength and coordination)       **Overall Severity Rating: WFL.   SWALLOW INITIATION/REFLEX: (The reflex is normal if "triggered" by the time the bolus reached the base of the tongue)  **Overall Severity Rating: [facility].   PHARYNGEAL PHASE: (Pharyngeal function is normal if the bolus shows rapid, smooth, and continuous transit through the pharynx and there is no pharyngeal residue after the swallow)  **Overall Severity Rating: [facility].   LARYNGEAL PENETRATION: (Material entering into the laryngeal inlet/vestibule but not aspirated): None. ASPIRATION: None, until a minimal amount of aspiration w/ cough noted on Nectar consistency liquids x1 during Multiple sips -- Not w/ any other trials/swallows/consistencies and seen in the setting of pt's obvious Distaste for what was being ingested. The aspiration contacted the cords but did not significantly extend below the cords before the immediate Cough/throat clear response appeared to clear it.  ESOPHAGEAL PHASE: (Screening of the upper esophagus): no cervical Esophageal phase dysmotility observed/assessed during this study(lateral view, sitting). Limited view below d/t shoulders.   ASSESSMENT: Pt appears to present as having WFL swallowing function w/ no obvious neuromuscular deficits of swallowing; no overt oropharyngeal phase dysfunction during the swallow. No aspiration nor penetration of Thin liquid trials occurred during this study. Of note, a minimal amount of aspiration w/ cough noted on Nectar consistency liquid via Cup x1 during Multiple sips -- NOT w/ any other trials/swallows/consistencies and seen in the setting of pt's obvious distaste for what was being ingested. The aspiration contacted the cords but did not significantly extend below the cords before the  immediate Cough/throat clear response appeared to clear it. Radiologist present and agreed. No Esophageal phase dysmotility observed in the viewable Cervical Esophagus -- view was limited by pt's shoulders. Of Note, pt does endorse and c/o Esophageal phase Dysmotility (Globus/pressure sensation in the sternum/chest; Belching) w/ meals/foods -- if any potential dysmotility is occurring in the distal Esophagus, this would be best assessed/identified by GI. ANY Dysmotility or Regurgitation of Reflux material can increase risk for aspiration of the Reflux material during Retrograde flow thus impact Voicing and Pulmonary status. Would recommend f/u w/ GI for further assessment.  During the Oral phase, controlled and timely bolus management and lingual/oral control for smooth, cohesive bolus propulsion for A-P transfer occurred. Complete oral clearing post initial swallow w/ no oral residue remaining w/ any trial consistency. During the Pharyngeal phase, timely pharyngeal swallow initiation noted w/ trial consistencies. No aspiration nor penetration occurred w/ thin liquids/foods; vestibular closure appeared timely, complete. No pharyngeal residue remained post swallow; hyolaryngeal excursion and pharyngeal pressure/stripping wave all appeared complete during the swallow.  Discussed results of MBSS, video viewed w/ patient and questions answered.   PLAN/RECOMMENDATIONS:  A. Diet: fairly Regular diet consistency w/ Thin liquids - Cut meats/foods well, chew foods well. Moistened foods may be more advantageous  for ease of chewing.  B. Swallowing Precautions: general aspiration precautions; REFLUX PRECAUTIONS per pt's c/o mid-sternum discomfort and Belching when eating foods/meals  C. Recommended consultation to: GI for formal assessment/management of any Esophageal phase dysmotility in setting of pt's c/o Belching, Globus sensations.   D. Therapy recommendations: None  E. Results and recommendations were discussed  w/ patient, video viewed together, and questions answered about his swallowing/swallow study today. Patient had no further questions. Recommend f/u w/ his Home Health Speech Therapist to review/if any further questions.  OF NOTE: pt receives Home Health ST services addressing Speech per his report. Recommend continuation of skilled services targeting cognitive-linguistic abilities for ADLs.    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    Intracranial hemorrhage (HCC)    HFrEF (heart failure with reduced ejection fraction) (HCC)    Hypertensive emergency 11/22/2021   Obesity, Class III, BMI 40-49.9 (morbid obesity) (HCC) 05/22/2020   Cough    Other cirrhosis of liver (HCC)    Ventral hernia without obstruction or gangrene    Acute CHF (congestive heart failure) (HCC) 02/20/2020   Shortness of breath    Hypertensive urgency    Acute respiratory failure with hypoxia (HCC)    Neck pain    Prostate cancer screening 01/21/2020   Lymphedema of both lower extremities 10/17/2019   History of stroke 10/17/2019   CKD (chronic kidney disease) stage 3, GFR 30-59 ml/min (HCC) 08/31/2019   Swelling of limb 08/31/2019   DM (diabetes mellitus), type 2 with complications (HCC) 07/08/2019   Acute kidney injury superimposed on CKD (HCC) 07/08/2019   Chest pain 07/08/2019   Vitreous floaters of both eyes 06/03/2018   Moderate obstructive sleep apnea 02/11/2018   Chronic fatigue 12/16/2017   Essential hypertension 12/16/2017    History of small bowel obstruction 10/24/2017   Adjustment disorder with depressed mood 09/30/2017   Stroke (HCC) 09/14/2017   Acute on chronic heart failure with preserved ejection fraction (HFpEF) (HCC)    Hyperlipidemia    Gout    Elevated LFTs    Heart failure with preserved ejection fraction (HCC)    Morbid obesity (HCC)    Hypoalbuminemia 07/11/2017   Type 2 diabetes mellitus with hyperglycemia (HCC) 07/10/2017         Jerilynn Som, MS, CCC-SLP Speech Language Pathologist Rehab Services; [facility] [phone] ([address] [place], CCC-SLP 03/14/2022, 2:52 PM  [facility] DIAGNOSTIC RADIOLOGY [address] [place], [place], [address] Phone: [phone]   Fax:     Name: LYNTON CRESCENZO MRN: [id number] Date of Birth: 06-15-1970

## 2022-03-15 NOTE — Addendum Note (Signed)
Encounter addended by: Hulan Fess, CCC-SLP on: 03/15/2022 8:27 AM  Actions taken: Clinical Note Signed

## 2022-03-20 ENCOUNTER — Ambulatory Visit: Payer: BC Managed Care – PPO | Admitting: Physician Assistant

## 2022-03-20 ENCOUNTER — Encounter: Payer: Self-pay | Admitting: Physician Assistant

## 2022-03-20 VITALS — BP 117/79 | HR 96 | Temp 98.6°F | Resp 16 | Ht 70.0 in | Wt 298.9 lb

## 2022-03-20 DIAGNOSIS — Z1211 Encounter for screening for malignant neoplasm of colon: Secondary | ICD-10-CM | POA: Insufficient documentation

## 2022-03-20 DIAGNOSIS — Z23 Encounter for immunization: Secondary | ICD-10-CM | POA: Diagnosis not present

## 2022-03-20 DIAGNOSIS — Z6841 Body Mass Index (BMI) 40.0 and over, adult: Secondary | ICD-10-CM

## 2022-03-20 DIAGNOSIS — K7469 Other cirrhosis of liver: Secondary | ICD-10-CM

## 2022-03-20 DIAGNOSIS — E1165 Type 2 diabetes mellitus with hyperglycemia: Secondary | ICD-10-CM | POA: Diagnosis not present

## 2022-03-20 MED ORDER — FREESTYLE LIBRE 3 SENSOR MISC: 11 refills | Status: DC

## 2022-03-20 NOTE — Progress Notes (Signed)
I,Joseline E Rosas,acting as a Neurosurgeon for [employer], PA-C.,have documented all relevant documentation on the behalf of Debera Lat, PA-C,as directed by  [employer], PA-C while in the presence of [employer], PA-C.   Established patient visit   Patient: Jeremiah Vance   DOB: June 08, 1970   [name]  MRN: [id number] Visit Date: 03/20/2022  Today's healthcare provider: Debera Lat, PA-C   Chief Complaint  Patient presents with   Follow-up   Subjective    HPI  Diabetes Mellitus Type II, Follow-up  Lab Results  Component Value Date   HGBA1C 5.5 03/12/2022   HGBA1C 7.0 (H) 01/16/2022   HGBA1C 10.5 (H) 11/22/2021   Wt Readings from Last 3 Encounters:  03/20/22 298 lb 14.4 oz (135.6 kg)  03/12/22 (!) 300 lb 3.2 oz (136.2 kg)  03/07/22 (!) 308 lb (139.7 kg)   Last seen for diabetes 2 weeks ago.  Management since then includes provided with a free sample of Freestyle Libre for continuous blood sugar monitoring/a trial. .  Symptoms: No fatigue No foot ulcerations  No appetite changes No nausea  No paresthesia of the feet  No polydipsia  No polyuria No visual disturbances   No vomiting     Home blood sugar records: fasting range: 110's-120's  Episodes of hypoglycemia? No    Pertinent Labs: Lab Results  Component Value Date   CHOL 106 01/16/2022   HDL 24 (L) 01/16/2022   LDLCALC 61 01/16/2022   TRIG 104 01/16/2022   CHOLHDL 4.4 01/16/2022   Lab Results  Component Value Date   NA 139 03/07/2022   K 3.5 03/07/2022   CREATININE 1.93 (H) 03/07/2022   GFRNONAA 41 (L) 03/07/2022   MICROALBUR 86.0 (H) 01/18/2020     ---------------------------------------------------------------------------------------------------   Medications: Outpatient Medications Prior to Visit  Medication Sig   aspirin EC 81 MG tablet Take 1 tablet (81 mg total) by mouth daily. Swallow whole.   atorvastatin (LIPITOR) 40 MG tablet Take 1 tablet (40 mg total) by mouth daily at  6 PM.   carvedilol (COREG) 12.5 MG tablet Take 1 tablet (12.5 mg total) by mouth 2 (two) times daily with a meal.   dapagliflozin propanediol (FARXIGA) 10 MG TABS tablet Take 1 tablet (10 mg) by mouth once daily   glimepiride (AMARYL) 4 MG tablet Take 1 tablet (4 mg total) by mouth daily with breakfast.   hydrALAZINE (APRESOLINE) 50 MG tablet Take 1 tablet (50 mg total) by mouth in the morning and at bedtime.   irbesartan (AVAPRO) 300 MG tablet Take 300 mg by mouth daily.   LANTUS 100 UNIT/ML injection SMARTSIG:15 Unit(s) SUB-Q Daily   linagliptin (TRADJENTA) 5 MG TABS tablet Take 1 tablet (5 mg total) by mouth daily.   torsemide (DEMADEX) 20 MG tablet Take 2 tablets (40 mg total) by mouth daily.   No facility-administered medications prior to visit.    Review of Systems  All other systems reviewed and are negative. Except see HPI     Objective    BP 117/79 (BP Location: Left Arm, Patient Position: Sitting, Cuff Size: Large)   Pulse 96   Temp 98.6 F (37 C) (Oral)   Resp 16   Ht 5\' 10"  (1.778 m)   Wt 298 lb 14.4 oz (135.6 kg)   BMI 42.89 kg/m    Physical Exam Vitals reviewed.  Constitutional:      General: He is not in acute distress.    Appearance: Normal appearance. He  is not diaphoretic.  HENT:     Head: Normocephalic and atraumatic.     Nose: Nose normal.  Eyes:     General: No scleral icterus.    Conjunctiva/sclera: Conjunctivae normal.  Cardiovascular:     Rate and Rhythm: Normal rate and regular rhythm.     Pulses: Normal pulses.          Dorsalis pedis pulses are 2+ on the right side and 2+ on the left side.     Heart sounds: Normal heart sounds. No murmur heard. Pulmonary:     Effort: Pulmonary effort is normal. No respiratory distress.     Breath sounds: Normal breath sounds. No wheezing or rhonchi.  Musculoskeletal:     Cervical back: Normal range of motion and neck supple.     Right lower leg: No edema.     Left lower leg: No edema.     Right foot:  Normal range of motion. No deformity, bunion, Charcot foot or foot drop.     Left foot: Normal range of motion. No deformity, bunion, Charcot foot or foot drop.  Feet:     Right foot:     Protective Sensation: 8 sites tested.  7 sites sensed.     Skin integrity: Callus and dry skin present. No ulcer, blister, skin breakdown, erythema, warmth or fissure.     Toenail Condition: Right toenails are normal.     Left foot:     Protective Sensation: 8 sites tested.  7 sites sensed.     Skin integrity: Callus and dry skin present. No ulcer, blister, skin breakdown, erythema, warmth or fissure.     Toenail Condition: Left toenails are normal.  Lymphadenopathy:     Cervical: No cervical adenopathy.  Skin:    General: Skin is warm and dry.     Findings: No rash.  Neurological:     Mental Status: He is alert and oriented to person, place, and time. Mental status is at baseline.  Psychiatric:        Mood and Affect: Mood normal.        Behavior: Behavior normal.        Thought Content: Thought content normal.        Judgment: Judgment normal.      No results found for any visits on 03/20/22.  Assessment & Plan     1. Need for diphtheria-tetanus-pertussis (Tdap) vaccine  - Tdap vaccine greater than or equal to 7yo IM  2. Type 2 diabetes mellitus with hyperglycemia, without long-term current use of insulin (HCC) Chronic and stable His BS at home between 108 fasting to 180 after a meal Discussed his options Due to financial constrains, it could be problematic to continue with tradjenta, we will start with farxiga . Might start with 5 mg and continue with 10mg  depending on BS control. Pt's sister was able to find coupons for farxiga and freestyle libre. Farxiga 10mg  was provided to pt /samples #3 - Continuous Blood Gluc Sensor (FREESTYLE LIBRE 3 SENSOR) MISC; Place 1 sensor on the skin every 14 days. Use to check glucose continuously  Dispense: 2 each; Refill: 11 Last A1c was 5.5 Might need  to consider Ozempic., hold lantus 15uniits Will reassess pt in 3-4 weeks  3. Morbid obesity (HCC) Congratulated pt with losing weight, below 300 today. Encouraged to continue with weight loss via dietary restrictions. Discussed a few weight loss options with him: DMII medications with weight loss uses and  low carb diet and daily  exercise Might need to start a medication for weight loss and BS Will FU  4. Colon cancer screening  - Cologuard.  The patient was advised to call back or seek an in-person evaluation if the symptoms worsen or if the condition fails to improve as anticipated.  I discussed the assessment and treatment plan with the patient. The patient was provided an opportunity to ask questions and all were answered. The patient agreed with the plan and demonstrated an understanding of the instructions.  The entirety of the information documented in the History of Present Illness, Review of Systems and Physical Exam were personally obtained by me. Portions of this information were initially documented by the CMA and reviewed by me for thoroughness and accuracy.     Debera Lat, [facility], MMS [facility] [phone] (phone) [phone] (fax)

## 2022-03-20 NOTE — Patient Instructions (Signed)
Calorie Counting for Weight Loss Calories are units of energy. Your body needs a certain number of calories from food to keep going throughout the day. When you eat or drink more calories than your body needs, your body stores the extra calories mostly as fat. When you eat or drink fewer calories than your body needs, your body burns fat to get the energy it needs. Calorie counting means keeping track of how many calories you eat and drink each day. Calorie counting can be helpful if you need to lose weight. If you eat fewer calories than your body needs, you should lose weight. Ask your health care provider what a healthy weight is for you. For calorie counting to work, you will need to eat the right number of calories each day to lose a healthy amount of weight per week. A dietitian can help you figure out how many calories you need in a day and will suggest ways to reach your calorie goal. A healthy amount of weight to lose each week is usually 1-2 lb (0.5-0.9 kg). This usually means that your daily calorie intake should be reduced by 500-750 calories. Eating 1,200-1,500 calories a day can help most women lose weight. Eating 1,500-1,800 calories a day can help most men lose weight. What do I need to know about calorie counting? Work with your health care provider or dietitian to determine how many calories you should get each day. To meet your daily calorie goal, you will need to: Find out how many calories are in each food that you would like to eat. Try to do this before you eat. Decide how much of the food you plan to eat. Keep a food log. Do this by writing down what you ate and how many calories it had. To successfully lose weight, it is important to balance calorie counting with a healthy lifestyle that includes regular activity. Where do I find calorie information?  The number of calories in a food can be found on a Nutrition Facts label. If a food does not have a Nutrition Facts label, try  to look up the calories online or ask your dietitian for help. Remember that calories are listed per serving. If you choose to have more than one serving of a food, you will have to multiply the calories per serving by the number of servings you plan to eat. For example, the label on a package of bread might say that a serving size is 1 slice and that there are 90 calories in a serving. If you eat 1 slice, you will have eaten 90 calories. If you eat 2 slices, you will have eaten 180 calories. How do I keep a food log? After each time that you eat, record the following in your food log as soon as possible: What you ate. Be sure to include toppings, sauces, and other extras on the food. How much you ate. This can be measured in cups, ounces, or number of items. How many calories were in each food and drink. The total number of calories in the food you ate. Keep your food log near you, such as in a pocket-sized notebook or on an app or website on your mobile phone. Some programs will calculate calories for you and show you how many calories you have left to meet your daily goal. What are some portion-control tips? Know how many calories are in a serving. This will help you know how many servings you can have of a certain   food. Use a measuring cup to measure serving sizes. You could also try weighing out portions on a kitchen scale. With time, you will be able to estimate serving sizes for some foods. Take time to put servings of different foods on your favorite plates or in your favorite bowls and cups so you know what a serving looks like. Try not to eat straight from a food's packaging, such as from a bag or box. Eating straight from the package makes it hard to see how much you are eating and can lead to overeating. Put the amount you would like to eat in a cup or on a plate to make sure you are eating the right portion. Use smaller plates, glasses, and bowls for smaller portions and to prevent  overeating. Try not to multitask. For example, avoid watching TV or using your computer while eating. If it is time to eat, sit down at a table and enjoy your food. This will help you recognize when you are full. It will also help you be more mindful of what and how much you are eating. What are tips for following this plan? Reading food labels Check the calorie count compared with the serving size. The serving size may be smaller than what you are used to eating. Check the source of the calories. Try to choose foods that are high in protein, fiber, and vitamins, and low in saturated fat, trans fat, and sodium. Shopping Read nutrition labels while you shop. This will help you make healthy decisions about which foods to buy. Pay attention to nutrition labels for low-fat or fat-free foods. These foods sometimes have the same number of calories or more calories than the full-fat versions. They also often have added sugar, starch, or salt to make up for flavor that was removed with the fat. Make a grocery list of lower-calorie foods and stick to it. Cooking Try to cook your favorite foods in a healthier way. For example, try baking instead of frying. Use low-fat dairy products. Meal planning Use more fruits and vegetables. One-half of your plate should be fruits and vegetables. Include lean proteins, such as chicken, turkey, and fish. Lifestyle Each week, aim to do one of the following: 150 minutes of moderate exercise, such as walking. 75 minutes of vigorous exercise, such as running. General information Know how many calories are in the foods you eat most often. This will help you calculate calorie counts faster. Find a way of tracking calories that works for you. Get creative. Try different apps or programs if writing down calories does not work for you. What foods should I eat?  Eat nutritious foods. It is better to have a nutritious, high-calorie food, such as an avocado, than a food with  few nutrients, such as a bag of potato chips. Use your calories on foods and drinks that will fill you up and will not leave you hungry soon after eating. Examples of foods that fill you up are nuts and nut butters, vegetables, lean proteins, and high-fiber foods such as whole grains. High-fiber foods are foods with more than 5 g of fiber per serving. Pay attention to calories in drinks. Low-calorie drinks include water and unsweetened drinks. The items listed above may not be a complete list of foods and beverages you can eat. Contact a dietitian for more information. What foods should I limit? Limit foods or drinks that are not good sources of vitamins, minerals, or protein or that are high in unhealthy fats. These   include: Candy. Other sweets. Sodas, specialty coffee drinks, alcohol, and juice. The items listed above may not be a complete list of foods and beverages you should avoid. Contact a dietitian for more information. How do I count calories when eating out? Pay attention to portions. Often, portions are much larger when eating out. Try these tips to keep portions smaller: Consider sharing a meal instead of getting your own. If you get your own meal, eat only half of it. Before you start eating, ask for a container and put half of your meal into it. When available, consider ordering smaller portions from the menu instead of full portions. Pay attention to your food and drink choices. Knowing the way food is cooked and what is included with the meal can help you eat fewer calories. If calories are listed on the menu, choose the lower-calorie options. Choose dishes that include vegetables, fruits, whole grains, low-fat dairy products, and lean proteins. Choose items that are boiled, broiled, grilled, or steamed. Avoid items that are buttered, battered, fried, or served with cream sauce. Items labeled as crispy are usually fried, unless stated otherwise. Choose water, low-fat milk,  unsweetened iced tea, or other drinks without added sugar. If you want an alcoholic beverage, choose a lower-calorie option, such as a glass of wine or light beer. Ask for dressings, sauces, and syrups on the side. These are usually high in calories, so you should limit the amount you eat. If you want a salad, choose a garden salad and ask for grilled meats. Avoid extra toppings such as bacon, cheese, or fried items. Ask for the dressing on the side, or ask for olive oil and vinegar or lemon to use as dressing. Estimate how many servings of a food you are given. Knowing serving sizes will help you be aware of how much food you are eating at restaurants. Where to find more information Centers for Disease Control and Prevention: http://www.wolf.info/ U.[place]. Department of Agriculture: http://www.wilson-mendoza.org/ Summary Calorie counting means keeping track of how many calories you eat and drink each day. If you eat fewer calories than your body needs, you should lose weight. A healthy amount of weight to lose per week is usually 1-2 lb (0.5-0.9 kg). This usually means reducing your daily calorie intake by 500-750 calories. The number of calories in a food can be found on a Nutrition Facts label. If a food does not have a Nutrition Facts label, try to look up the calories online or ask your dietitian for help. Use smaller plates, glasses, and bowls for smaller portions and to prevent overeating. Use your calories on foods and drinks that will fill you up and not leave you hungry shortly after a meal. This information is not intended to replace advice given to you by your health care provider. Make sure you discuss any questions you have with your health care provider. Document Revised: 04/29/2019 Document Reviewed: 04/29/2019 Elsevier Patient Education  [place] and Cholesterol Restricted Eating Plan Getting too much fat and cholesterol in your diet may cause health problems. Choosing the right foods helps keep  your fat and cholesterol at normal levels. This can keep you from getting certain diseases. Your doctor may recommend an eating plan that includes: Total fat: ______% or less of total calories a day. This is ______g of fat a day. Saturated fat: ______% or less of total calories a day. This is ______g of saturated fat a day. Cholesterol: less than _________mg a day. Fiber: ______g  a day. What are tips for following this plan? General tips Work with your doctor to lose weight if you need to. Avoid: Foods with added sugar. Fried foods. Foods with trans fat or partially hydrogenated oils. This includes some margarines and baked goods. If you drink alcohol: Limit how much you have to: 0-1 drink a day for women who are not pregnant. 0-2 drinks a day for men. Know how much alcohol is in a drink. In the [place] one drink equals one 12 oz bottle of beer (355 mL), one 5 oz glass of wine (148 mL), or one 1 oz glass of hard liquor (44 mL). Reading food labels Check food labels for: Trans fats. Partially hydrogenated oils. Saturated fat (g) in each serving. Cholesterol (mg) in each serving. Fiber (g) in each serving. Choose foods with healthy fats, such as: Monounsaturated fats and polyunsaturated fats. These include olive and canola oil, flaxseeds, walnuts, almonds, and seeds. Omega-3 fats. These are found in certain fish, flaxseed oil, and ground flaxseeds. Choose grain products that have whole grains. Look for the word "whole" as the first word in the ingredient list. Cooking Cook foods using low-fat methods. These include baking, boiling, grilling, and broiling. Eat more home-cooked foods. Eat at restaurants and buffets less often. Eat less fast food. Avoid cooking using saturated fats, such as butter, cream, palm oil, palm kernel oil, and coconut oil. Meal planning  At meals, divide your plate into four equal parts: Fill one-half of your plate with vegetables, green salads, and  fruit. Fill one-fourth of your plate with whole grains. Fill one-fourth of your plate with low-fat (lean) protein foods. Eat fish that is high in omega-3 fats at least two times a week. This includes mackerel, tuna, sardines, and salmon. Eat foods that are high in fiber, such as whole grains, beans, apples, pears, berries, broccoli, carrots, peas, and barley. What foods should I eat? Fruits All fresh, canned (in natural juice), or frozen fruits. Vegetables Fresh or frozen vegetables (raw, steamed, roasted, or grilled). Green salads. Grains Whole grains, such as whole wheat or whole grain breads, crackers, cereals, and pasta. Unsweetened oatmeal, bulgur, barley, quinoa, or brown rice. Corn or whole wheat flour tortillas. Meats and other protein foods Ground beef (85% or leaner), grass-fed beef, or beef trimmed of fat. Skinless chicken or turkey. Ground chicken or turkey. Pork trimmed of fat. All fish and seafood. Egg whites. Dried beans, peas, or lentils. Unsalted nuts or seeds. Unsalted canned beans. Nut butters without added sugar or oil. Dairy Low-fat or nonfat dairy products, such as skim or 1% milk, 2% or reduced-fat cheeses, low-fat and fat-free ricotta or cottage cheese, or plain low-fat and nonfat yogurt. Fats and oils Tub margarine without trans fats. Light or reduced-fat mayonnaise and salad dressings. Avocado. Olive, canola, sesame, or safflower oils. The items listed above may not be a complete list of foods and beverages you can eat. Contact a dietitian for more information. What foods should I avoid? Fruits Canned fruit in heavy syrup. Fruit in cream or butter sauce. Fried fruit. Vegetables Vegetables cooked in cheese, cream, or butter sauce. Fried vegetables. Grains White bread. White pasta. White rice. Cornbread. Bagels, pastries, and croissants. Crackers and snack foods that contain trans fat and hydrogenated oils. Meats and other protein foods Fatty cuts of meat. Ribs,  chicken wings, bacon, sausage, bologna, salami, chitterlings, fatback, hot dogs, bratwurst, and packaged lunch meats. Liver and organ meats. Whole eggs and egg yolks. Chicken and turkey with skin. Fried   meat. Dairy Whole or 2% milk, cream, half-and-half, and cream cheese. Whole milk cheeses. Whole-fat or sweetened yogurt. Full-fat cheeses. Nondairy creamers and whipped toppings. Processed cheese, cheese spreads, and cheese curds. Fats and oils Butter, stick margarine, lard, shortening, ghee, or bacon fat. Coconut, palm kernel, and palm oils. Beverages Alcohol. Sugar-sweetened drinks such as sodas, lemonade, and fruit drinks. Sweets and desserts Corn syrup, sugars, honey, and molasses. Candy. Jam and jelly. Syrup. Sweetened cereals. Cookies, pies, cakes, donuts, muffins, and ice cream. The items listed above may not be a complete list of foods and beverages you should avoid. Contact a dietitian for more information. Summary Choosing the right foods helps keep your fat and cholesterol at normal levels. This can keep you from getting certain diseases. At meals, fill one-half of your plate with vegetables, green salads, and fruits. Eat high fiber foods, like whole grains, beans, apples, pears, berries, carrots, peas, and barley. Limit added sugar, saturated fats, alcohol, and fried foods. This information is not intended to replace advice given to you by your health care provider. Make sure you discuss any questions you have with your health care provider. Document Revised: 07/28/2020 Document Reviewed: 07/28/2020 Elsevier Patient Education  2023 Elsevier Inc.  

## 2022-03-22 ENCOUNTER — Ambulatory Visit: Payer: BC Managed Care – PPO | Admitting: Physician Assistant

## 2022-04-19 ENCOUNTER — Telehealth: Payer: Self-pay | Admitting: Physician Assistant

## 2022-04-19 ENCOUNTER — Telehealth: Payer: Self-pay | Admitting: *Deleted

## 2022-04-19 MED ORDER — APIXABAN 5 MG PO TABS: 5.0000 mg | ORAL_TABLET | Freq: Two times a day (BID) | ORAL | 11 refills | Status: DC

## 2022-04-19 NOTE — Progress Notes (Signed)
Abnormal monitor with paroxysmal atrial fibrillation. Discontinue ASA and start on Eliquis 5 mg bid for stroke prophylaxis. Keep follow up as scheduled.

## 2022-04-19 NOTE — Telephone Encounter (Signed)
-----  Message from Gerrie Nordmann, NP sent at 04/19/2022 10:06 AM EST ----- Abnormal monitor with paroxysmal atrial fibrillation. Discontinue ASA and start on Eliquis 5 mg bid for stroke prophylaxis. Keep follow up as scheduled.

## 2022-04-19 NOTE — Telephone Encounter (Signed)
Reviewed results and recommendations with patient. He requested that I please send that in so he could pick it up today. Advised that it has been sent to [facility] on [address]. Reviewed reason for this medication. He verbalized understanding with no further questions at this time.

## 2022-04-19 NOTE — Telephone Encounter (Signed)
Copied from [place] [phone]. Topic: General - Other >> Apr 19, 2022  2:08 PM Chapman Fitch wrote: Reason for CRM: Pt has started back to work and needs a letter for his employer stating that if the pt needs to sit down it is ok due to having strokes and heart issues / please advise / pt would like to pick this up next Tuesday or Wednesday

## 2022-04-23 ENCOUNTER — Ambulatory Visit: Payer: BC Managed Care – PPO | Admitting: Physician Assistant

## 2022-04-23 ENCOUNTER — Encounter: Payer: Self-pay | Admitting: Physician Assistant

## 2022-04-23 VITALS — BP 142/84 | HR 76 | Temp 97.8°F | Ht 70.0 in | Wt 298.0 lb

## 2022-04-23 DIAGNOSIS — I639 Cerebral infarction, unspecified: Secondary | ICD-10-CM

## 2022-04-23 DIAGNOSIS — I5030 Unspecified diastolic (congestive) heart failure: Secondary | ICD-10-CM | POA: Diagnosis not present

## 2022-04-23 DIAGNOSIS — E1165 Type 2 diabetes mellitus with hyperglycemia: Secondary | ICD-10-CM

## 2022-04-23 DIAGNOSIS — Z1211 Encounter for screening for malignant neoplasm of colon: Secondary | ICD-10-CM

## 2022-04-23 DIAGNOSIS — I890 Lymphedema, not elsewhere classified: Secondary | ICD-10-CM

## 2022-04-23 DIAGNOSIS — I10 Essential (primary) hypertension: Secondary | ICD-10-CM

## 2022-04-23 NOTE — Progress Notes (Unsigned)
Established patient visit   Patient: Jeremiah Vance   DOB: [birth year]   52 y.o. Male  MRN: [id number] Visit Date: 04/23/2022  Today's healthcare provider: [name], PA-C   CC: work note  Subjective     HPI   Pt wants specfic work note for what he can and cannot do. Last edited by Elta Guadeloupe, CMA on 04/23/2022 11:06 AM.      Pt is doing better and will see me in a week. He lost 50 pounds and doing well with his current diet and dm treatment Pt has Medical hx significant for recent CVA, HF, DM, CKD, morbid obesity and sleep apnea. Pt needs work accommodation which permit him to sit as needed during his work shifts. He cannot stand for an entire 8 hour shift.  Medications: Outpatient Medications Prior to Visit  Medication Sig   apixaban (ELIQUIS) 5 MG TABS tablet Take 1 tablet (5 mg total) by mouth 2 (two) times daily.   atorvastatin (LIPITOR) 40 MG tablet Take 1 tablet (40 mg total) by mouth daily at 6 PM.   carvedilol (COREG) 12.5 MG tablet Take 1 tablet (12.5 mg total) by mouth 2 (two) times daily with a meal.   Continuous Blood Gluc Sensor (FREESTYLE LIBRE 3 SENSOR) MISC Place 1 sensor on the skin every 14 days. Use to check glucose continuously   dapagliflozin propanediol (FARXIGA) 10 MG TABS tablet Take 1 tablet (10 mg) by mouth once daily   glimepiride (AMARYL) 4 MG tablet Take 1 tablet (4 mg total) by mouth daily with breakfast.   hydrALAZINE (APRESOLINE) 50 MG tablet Take 1 tablet (50 mg total) by mouth in the morning and at bedtime.   irbesartan (AVAPRO) 300 MG tablet Take 300 mg by mouth daily.   LANTUS 100 UNIT/ML injection SMARTSIG:15 Unit(s) SUB-Q Daily   linagliptin (TRADJENTA) 5 MG TABS tablet Take 1 tablet (5 mg total) by mouth daily.   torsemide (DEMADEX) 20 MG tablet Take 2 tablets (40 mg total) by mouth daily.   No facility-administered medications prior to visit.    Review of Systems  All other systems reviewed and are negative. Except see  hpi  {Labs  Heme  Chem  Endocrine  Serology  Results Review (optional):23779}   Objective    BP (!) 142/84 (BP Location: Right Arm, Patient Position: Sitting, Cuff Size: Large)   Pulse 76   Temp 97.8 F (36.6 C)   Ht 5\' 10"  (1.778 m)   Wt 298 lb (135.2 kg)   SpO2 98%   BMI 42.76 kg/m  {Show previous vital signs (optional):23777}  Physical Exam Constitutional:      General: He is not in acute distress.    Appearance: Normal appearance. He is not diaphoretic.  HENT:     Head: Normocephalic.     Nose: Nose normal.  Eyes:     Conjunctiva/sclera: Conjunctivae normal.     Pupils: Pupils are equal, round, and reactive to light.  Pulmonary:     Effort: Pulmonary effort is normal. No respiratory distress.  Musculoskeletal:        General: Normal range of motion.     Cervical back: Normal range of motion.  Neurological:     Mental Status: He is alert and oriented to person, place, and time. Mental status is at baseline.  Psychiatric:        Behavior: Behavior normal.        Thought Content: Thought content normal.  Judgment: Judgment normal.     No results found for any visits on 04/23/22.  Assessment & Plan    Type 2 diabetes mellitus with hyperglycemia, without long-term current use of insulin (HCC) Morbid obesity (HCC) Heart failure with preserved ejection fraction, unspecified HF chronicity ([place]) Cerebrovascular accident (CVA), unspecified mechanism ([place]) Lymphedema of both lower extremities Chronic and stable But his productivity is effected by multiple co morbidities, see above Pt was advised to proceed with special accommodation at work to sustain his proper performance at work. Work note was provided Will fu with pt in a week as scheduled.  The patient was advised to call back or seek an in-person evaluation if the symptoms worsen or if the condition fails to improve as anticipated.  I discussed the assessment and treatment plan with the patient. The  patient was provided an opportunity to ask questions and all were answered. The patient agreed with the plan and demonstrated an understanding of the instructions.  The entirety of the information documented in the History of Present Illness, Review of Systems and Physical Exam were personally obtained by me. Portions of this information were initially documented by the CMA and reviewed by me for thoroughness and accuracy.  [name], [facility], [place] [phone] (phone) [phone] (fax)

## 2022-04-24 ENCOUNTER — Ambulatory Visit: Payer: BC Managed Care – PPO | Admitting: Physician Assistant

## 2022-04-28 NOTE — Progress Notes (Signed)
Established patient visit   Patient: Jeremiah Vance   DOB: [birth year]   52 y.o. Male  MRN: [id number] Visit Date: 05/01/2022  Today's healthcare provider: [name], PA-C   CC: DMII FU, HTN FU  Subjective    HPI  Diabetes Mellitus Type II, Follow-up  Lab Results  Component Value Date   HGBA1C 5.5 03/12/2022   HGBA1C 7.0 (H) 01/16/2022   HGBA1C 10.5 (H) 11/22/2021   Wt Readings from Last 3 Encounters:  04/23/22 298 lb (135.2 kg)  03/20/22 298 lb 14.4 oz (135.6 kg)  03/12/22 (!) 300 lb 3.2 oz (136.2 kg)   Last seen for diabetes 5 weeks ago.  Management since then includes farxiga 10mg . Home blood sugar records: 100-130  Episodes of hypoglycemia? No    Current insulin regiment:no, taking farxiga 10mg  , hold on all other medications ---------------------------------------------------------------------------------------------------  Hypertension, follow-up  BP Readings from Last 3 Encounters:  04/23/22 (!) 142/84  03/20/22 117/79  03/12/22 136/76   Wt Readings from Last 3 Encounters:  04/23/22 298 lb (135.2 kg)  03/20/22 298 lb 14.4 oz (135.6 kg)  03/12/22 (!) 300 lb 3.2 oz (136.2 kg)     He was last seen for hypertension 5 weeks ago.  BP at that visit was see above. Management since that visit includes coreg, hydralazine, irbesartan and torsemide.  Outside blood pressures are  --------------------------------------------100-130-------------------------------------------------------   Medications: Outpatient Medications Prior to Visit  Medication Sig   apixaban (ELIQUIS) 5 MG TABS tablet Take 1 tablet (5 mg total) by mouth 2 (two) times daily.   atorvastatin (LIPITOR) 40 MG tablet Take 1 tablet (40 mg total) by mouth daily at 6 PM.   carvedilol (COREG) 12.5 MG tablet Take 1 tablet (12.5 mg total) by mouth 2 (two) times daily with a meal.   Continuous Blood Gluc Sensor (FREESTYLE LIBRE 3 SENSOR) MISC Place 1 sensor on the skin every 14 days. Use to  check glucose continuously   dapagliflozin propanediol (FARXIGA) 10 MG TABS tablet Take 1 tablet (10 mg) by mouth once daily   glimepiride (AMARYL) 4 MG tablet Take 1 tablet (4 mg total) by mouth daily with breakfast.   hydrALAZINE (APRESOLINE) 50 MG tablet Take 1 tablet (50 mg total) by mouth in the morning and at bedtime.   irbesartan (AVAPRO) 300 MG tablet Take 300 mg by mouth daily.   LANTUS 100 UNIT/ML injection SMARTSIG:15 Unit(s) SUB-Q Daily   linagliptin (TRADJENTA) 5 MG TABS tablet Take 1 tablet (5 mg total) by mouth daily.   torsemide (DEMADEX) 20 MG tablet Take 2 tablets (40 mg total) by mouth daily.   No facility-administered medications prior to visit.    Review of Systems  All other systems reviewed and are negative.  Except see hpi    Objective    There were no vitals taken for this visit.   Physical Exam Vitals reviewed.  Constitutional:      General: He is not in acute distress.    Appearance: Normal appearance. He is not diaphoretic.  HENT:     Head: Normocephalic and atraumatic.  Eyes:     General: No scleral icterus.    Conjunctiva/sclera: Conjunctivae normal.  Cardiovascular:     Rate and Rhythm: Normal rate and regular rhythm.     Pulses: Normal pulses.     Heart sounds: Normal heart sounds. No murmur heard. Pulmonary:     Effort: Pulmonary effort is normal. No respiratory distress.     Breath sounds:  Normal breath sounds. No wheezing or rhonchi.  Musculoskeletal:     Cervical back: Neck supple.     Right lower leg: No edema.     Left lower leg: No edema.  Lymphadenopathy:     Cervical: No cervical adenopathy.  Skin:    General: Skin is warm and dry.     Findings: No rash.  Neurological:     Mental Status: He is alert and oriented to person, place, and time. Mental status is at baseline.  Psychiatric:        Mood and Affect: Mood normal.        Behavior: Behavior normal.       No results found for any visits on 05/01/22.  Assessment &  Plan     Morbid obesity (HCC) BMI 42.33 Lost 13 pounds x 2 months. Continue weight loss via healthy diet and daily exercise  Continue to work Will FU.   Type 2 diabetes mellitus Chronic and stable Continue Farxiga - Urine Microalbumin w/creat. ratio - Continuous Blood Gluc Sensor (FREESTYLE LIBRE 2 SENSOR) MISC; Place 1 sensor on the skin every 14 days. Use to check glucose continuously  Dispense: 2 each; Refill: 11 Libre 3 does not calibrate with his cell phone Monitoring of his BS at home advised. Will reassess at his FU   Essential hypertension Chronic and Controlled Continue dietary sodium restrictions in details Continue/Increase dietary efforts and physical activity.  Continue BP log Continue  coreg 12.5 mg BID, torsemide 40mg  daily, irbesartan 300mg  daily    Hyperlipidemia, unspecified hyperlipidemia type Chronic and stable Lipids are stable for him and wnl from 3 mo ago Continue Lipitor 40mg  Strongly advised healthy diet and daily exercise Scheduled with Cardiology   Grief Has been dealing with loss of his family members, multiple medical conditions Has a few recent hospitalizations, recovering after strokes, HF, HTN emergencies  Has been under a lot of stress Advised counseling sessions - Ambulatory referral to Psychology Will FU  The patient was advised to call back or seek an in-person evaluation if the symptoms worsen or if the condition fails to improve as anticipated.  I discussed the assessment and treatment plan with the patient. The patient was provided an opportunity to ask questions and all were answered. The patient agreed with the plan and demonstrated an understanding of the instructions.  The entirety of the information documented in the History of Present Illness, Review of Systems and Physical Exam were personally obtained by me. Portions of this information were initially documented by the CMA and reviewed by me for thoroughness and accuracy.    I spent 30 minutes dedicated to the care of this patient on the date of this encounter to include pre-visit review of records, face-to-face with the patient discussing condition/treatment for DMII, HTN, HLD, grief, weight loss , and post visit ordering of testing and recording documentation. [name], [facility], [place] [phone] (phone) [phone] (fax)

## 2022-05-01 ENCOUNTER — Encounter: Payer: Self-pay | Admitting: Neurology

## 2022-05-01 ENCOUNTER — Ambulatory Visit: Payer: BC Managed Care – PPO | Admitting: Physician Assistant

## 2022-05-01 ENCOUNTER — Ambulatory Visit (INDEPENDENT_AMBULATORY_CARE_PROVIDER_SITE_OTHER): Payer: BC Managed Care – PPO | Admitting: Neurology

## 2022-05-01 ENCOUNTER — Encounter: Payer: Self-pay | Admitting: Physician Assistant

## 2022-05-01 VITALS — BP 116/72 | HR 86 | Ht 70.0 in | Wt 294.0 lb

## 2022-05-01 DIAGNOSIS — R635 Abnormal weight gain: Secondary | ICD-10-CM | POA: Diagnosis not present

## 2022-05-01 DIAGNOSIS — E1165 Type 2 diabetes mellitus with hyperglycemia: Secondary | ICD-10-CM | POA: Diagnosis not present

## 2022-05-01 DIAGNOSIS — G4733 Obstructive sleep apnea (adult) (pediatric): Secondary | ICD-10-CM

## 2022-05-01 DIAGNOSIS — I10 Essential (primary) hypertension: Secondary | ICD-10-CM

## 2022-05-01 DIAGNOSIS — I639 Cerebral infarction, unspecified: Secondary | ICD-10-CM

## 2022-05-01 DIAGNOSIS — I610 Nontraumatic intracerebral hemorrhage in hemisphere, subcortical: Secondary | ICD-10-CM

## 2022-05-01 DIAGNOSIS — I6381 Other cerebral infarction due to occlusion or stenosis of small artery: Secondary | ICD-10-CM

## 2022-05-01 DIAGNOSIS — I890 Lymphedema, not elsewhere classified: Secondary | ICD-10-CM | POA: Diagnosis not present

## 2022-05-01 DIAGNOSIS — E785 Hyperlipidemia, unspecified: Secondary | ICD-10-CM

## 2022-05-01 DIAGNOSIS — I5030 Unspecified diastolic (congestive) heart failure: Secondary | ICD-10-CM

## 2022-05-01 DIAGNOSIS — G4719 Other hypersomnia: Secondary | ICD-10-CM | POA: Diagnosis not present

## 2022-05-01 DIAGNOSIS — I2510 Atherosclerotic heart disease of native coronary artery without angina pectoris: Secondary | ICD-10-CM

## 2022-05-01 DIAGNOSIS — I2584 Coronary atherosclerosis due to calcified coronary lesion: Secondary | ICD-10-CM

## 2022-05-01 DIAGNOSIS — Z820 Family history of epilepsy and other diseases of the nervous system: Secondary | ICD-10-CM

## 2022-05-01 DIAGNOSIS — R7989 Other specified abnormal findings of blood chemistry: Secondary | ICD-10-CM

## 2022-05-01 DIAGNOSIS — F4321 Adjustment disorder with depressed mood: Secondary | ICD-10-CM

## 2022-05-01 MED ORDER — FREESTYLE LIBRE 2 SENSOR MISC: 11 refills | Status: DC

## 2022-05-01 NOTE — Patient Instructions (Signed)
Thank you for choosing [facility] for your sleep related care! It was nice to meet you today!   Here is what we discussed today:    Based on your symptoms and your exam I believe you are still at risk for obstructive sleep apnea (aka OSA). We should proceed with a sleep study to determine whether you do or do not have OSA and how severe it is. Even, if you have mild OSA, I may want you to consider treatment with CPAP, as treatment of even borderline or mild sleep apnea can result and improvement of symptoms such as sleep disruption, daytime sleepiness, nighttime bathroom breaks, restless leg symptoms, improvement of headache syndromes, even improved mood disorder.   As explained, an attended sleep study (meaning you get to stay overnight in the sleep lab), lets us monitor sleep-related behaviors such as sleep talking and leg movements in sleep, in addition to monitoring for sleep apnea.  A home sleep test is a screening tool for sleep apnea diagnosis only, but unfortunately, does not help with any other sleep-related diagnoses.  Please remember, the long-term risks and ramifications of untreated moderate to severe obstructive sleep apnea may include (but are not limited to): increased risk for cardiovascular disease, including congestive heart failure, stroke, difficult to control hypertension, treatment resistant obesity, arrhythmias, especially irregular heartbeat commonly known as A. Fib. (atrial fibrillation); even type 2 diabetes has been linked to untreated OSA.   Other correlations that untreated obstructive sleep apnea include macular edema which is swelling of the retina in the eyes, droopy eyelid syndrome, and elevated hemoglobin and hematocrit levels (often referred to as polycythemia).  Sleep apnea can cause disruption of sleep and sleep deprivation in most cases, which, in turn, can cause recurrent headaches, problems with memory, mood, concentration, focus, and  vigilance. Most people with untreated sleep apnea report excessive daytime sleepiness, which can affect their ability to drive. Please do not drive or use heavy equipment or machinery, if you feel sleepy! Patients with sleep apnea can also develop difficulty initiating and maintaining sleep (aka insomnia).   Having sleep apnea may increase your risk for other sleep disorders, including involuntary behaviors sleep such as sleep terrors, sleep talking, sleepwalking.    Having sleep apnea can also increase your risk for restless leg syndrome and leg movements at night.   Please note that untreated obstructive sleep apnea may carry additional perioperative morbidity. Patients with significant obstructive sleep apnea (typically, in the moderate to severe degree) should receive, if possible, perioperative PAP (positive airway pressure) therapy and the surgeons and particularly the anesthesiologists should be informed of the diagnosis and the severity of the sleep disordered breathing.   We will call you or email you through MyChart with regards to your test results and plan a follow-up in sleep clinic accordingly. Most likely, you will hear from one of our nurses.   Our sleep lab administrative assistant will call you to schedule your sleep study and give you further instructions, regarding the check in process for the sleep study, arrival time, what to bring, when you can expect to leave after the study, etc., and to answer any other logistical questions you may have. If you don't hear back from her by about 2 weeks from now, please feel free to call her direct line at [phone] or you can call our general clinic number, or email us through My Chart.   

## 2022-05-01 NOTE — Progress Notes (Signed)
Subjective:    Patient ID: Jeremiah Vance is a 52 y.o. male.  HPI    Star Age, MD, PhD [facility] [address], [address] P.O. [place], [place] [address]  Dear Mamie Nick,  I saw your patient, Jeremiah Vance, upon your kind request in my sleep clinic today for initial consultation of his sleep disorder, in particular, evaluation of his prior diagnosis of obstructive sleep apnea.  The patient is accompanied by his sister, [name], today.  As you know, [name] is a 52 year old male with an underlying medical history of lacunar strokes, intracerebral hemorrhage in August 2023, hypertension, chronic systolic CHF, poorly controlled diabetes, chronic kidney disease, and severe obesity with a BMI of over 40, who was previously diagnosed with obstructive sleep apnea and recommended for PAP therapy.  His Epworth sleepiness score is 12 out of 24, fatigue severity score is 49 out of 63.  He lives with his sister, mom and another sister have sleep apnea.  He works Thursday through Monday.  He bedtime is around 9 PM and rise time around 7 AM.  He goes to bed later on the other days and sleeps in a little later when he does not have to go to work.  He has occasional morning headaches but denies recurrent or nightly nocturia.  He has had some residual left hand numbness and tingling and pain from his recent stroke as well as some speech impairment.  He has not started the Eliquis yet.  They are going to discuss this with the cardiologist and primary care.  He is a non-smoker and drinks alcohol occasionally, drinks caffeine in the form of soda, 24 ounce bottle, 1 or 2/day.  He has had some weight fluctuation.  He is about 20 pounds heavier compared to when he had his sleep study in 2019.  He had gained a lot more weight and has actually lost weight since August 2023.  He is working on weight loss.  I reviewed your office note from 02/27/2022.  I was able to review his sleep study  report from 01/22/2018.  He had a baseline sleep study through [facility] sleep center.  His sleep efficiency was 87.3%, sleep latency 8.5 minutes, REM latency 88 minutes.  He had an increased percentage of stage II sleep, absence of slow-wave sleep and REM percentage was normal at 21.9%.  Total AHI was in the moderate range at 22.4/h, rising to 66.2/h during REM sleep.  Average oxygen saturation was 91%, nadir was 59%.  Moderate snoring was noted, study was interpreted by Dr. Baird Lyons.  His Past Medical History Is Significant For: Past Medical History:  Diagnosis Date   Acute ischemic stroke ([place]) 09/06/2017   Acute renal failure superimposed on stage 3a chronic kidney disease ([place]) 07/08/2019   Acute right-sided weakness    Allergies    Anasarca    Anemia 07/10/2017   Arthritis    CHF (congestive heart failure) ([place])    "coinsided w/kidney problems I was having 06/2017"   Chicken pox    CKD (chronic kidney disease) stage 3, GFR 30-59 ml/min (HCC) 06/2017   Depression    Elevated troponin 07/08/2019   High cholesterol    History of cardiomyopathy    LVEF 40 to 45% in April 2019 - subsequently normalized   Hyperbilirubinemia 07/10/2017   Hypertension    Ischemic stroke ([place])    Small left internal capsule infarct due to lacunar disease   Morbid obesity ([place])  Normocytic anemia 12/16/2017   Recurrent incisional hernia with incarceration s/p repair 10/22/2017 10/21/2017   Stroke ([place]) 08/2017   "right sided weakness since; getting stronger though" (10/22/2017)   Type 2 diabetes mellitus ([place])     His Past Surgical History Is Significant For: Past Surgical History:  Procedure Laterality Date   ABDOMINAL HERNIA REPAIR  2008; 10/22/2017   "scope; OPEN REPAIR INCARCERATED VENTRAL HERNIA   HERNIA REPAIR     KNEE ARTHROSCOPY Right 1989   VENTRAL HERNIA REPAIR N/A 10/22/2017   Procedure: OPEN REPAIR INCARCERATED VENTRAL HERNIA;  Surgeon: Excell Seltzer, MD;  Location: [facility];  Service:  General;  Laterality: N/A;    His Family History Is Significant For: Family History  Problem Relation Age of Onset   Diabetes Mother    Stroke Mother    Arthritis Mother    Depression Mother    Heart disease Mother    Hypertension Mother    Learning disabilities Mother    Mental illness Mother    Sleep apnea Mother    Diabetes Father    Heart disease Father    Arthritis Father    Hearing loss Father    Hyperlipidemia Father    Heart attack Father    Hypertension Father    Stroke Father    Diabetes Sister    Depression Sister    Diabetes Sister    Hypertension Sister    Mental illness Sister    Sleep apnea Sister    Diabetes Maternal Grandmother    Heart disease Maternal Grandmother    Depression Maternal Grandmother    Hyperlipidemia Maternal Grandmother    Hypertension Maternal Grandmother    Stroke Maternal Grandmother    Hypertension Maternal Grandfather    Hyperlipidemia Maternal Grandfather    Stroke Maternal Grandfather    Arthritis Paternal Grandmother    Hearing loss Paternal Grandmother    Stroke Paternal Grandfather    Heart disease Paternal Grandfather    Arthritis Paternal Grandfather    Heart attack Paternal Grandfather     His Social History Is Significant For: Social History   Socioeconomic History   Marital status: Divorced    Spouse name: Not on file   Number of children: Not on file   Years of education: Not on file   Highest education level: Not on file  Occupational History   Not on file  Tobacco Use   Smoking status: Never   Smokeless tobacco: Never  Vaping Use   Vaping Use: Never used  Substance and Sexual Activity   Alcohol use: Not Currently    Comment: occasional beer   Drug use: Never   Sexual activity: Not Currently  Other Topics Concern   Not on file  Social History Narrative   He lives with his sister , [name] and she is his MPOA after his stokes   Social Determinants of Health   Financial Resource Strain: High  Risk (03/20/2020)   Overall Financial Resource Strain (CARDIA)    Difficulty of Paying Living Expenses: Hard  Food Insecurity: No Food Insecurity (01/16/2022)   Hunger Vital Sign    Worried About Running Out of Food in the Last Year: Never true    Ran Out of Food in the Last Year: Never true  Transportation Needs: Unmet Transportation Needs (01/16/2022)   PRAPARE - Hydrologist (Medical): Yes    Lack of Transportation (Non-Medical): Yes  Physical Activity: Not on file  Stress: Not on file  Social  Connections: Not on file    His Allergies Are:  Allergies  Allergen Reactions   Pollen Extract Other (See Comments)   Sulfa Antibiotics   :   His Current Medications Are:  Outpatient Encounter Medications as of 05/01/2022  Medication Sig   atorvastatin (LIPITOR) 40 MG tablet Take 1 tablet (40 mg total) by mouth daily at 6 PM.   carvedilol (COREG) 12.5 MG tablet Take 1 tablet (12.5 mg total) by mouth 2 (two) times daily with a meal.   Continuous Blood Gluc Sensor (FREESTYLE LIBRE 3 SENSOR) MISC Place 1 sensor on the skin every 14 days. Use to check glucose continuously   dapagliflozin propanediol (FARXIGA) 10 MG TABS tablet Take 1 tablet (10 mg) by mouth once daily   hydrALAZINE (APRESOLINE) 50 MG tablet Take 1 tablet (50 mg total) by mouth in the morning and at bedtime.   irbesartan (AVAPRO) 300 MG tablet Take 300 mg by mouth daily.   LANTUS 100 UNIT/ML injection SMARTSIG:15 Unit(s) SUB-Q Daily   torsemide (DEMADEX) 20 MG tablet Take 2 tablets (40 mg total) by mouth daily.   apixaban (ELIQUIS) 5 MG TABS tablet Take 1 tablet (5 mg total) by mouth 2 (two) times daily. (Patient not taking: Reported on 05/01/2022)   glimepiride (AMARYL) 4 MG tablet Take 1 tablet (4 mg total) by mouth daily with breakfast. (Patient not taking: Reported on 05/01/2022)   linagliptin (TRADJENTA) 5 MG TABS tablet Take 1 tablet (5 mg total) by mouth daily. (Patient not taking: Reported on  05/01/2022)   No facility-administered encounter medications on file as of 05/01/2022.  :   Review of Systems:  Out of a complete 14 point review of systems, all are reviewed and negative with the exception of these symptoms as listed below:  Review of Systems  Neurological:        Pt here for sleep consult Pt snores ,fatigue ,hypertension,some headaches. Pt states sleep study  4 years ago . Couldn't get CPAP didn't have insurance Pt states 3 strokes August 2023    ESS:12 GSS:47    Objective:  Neurological Exam  Physical Exam Physical Examination:   Vitals:   05/01/22 1050  BP: 116/72  Pulse: 86    General Examination: The patient is a very pleasant 52 y.o. male in no acute distress. He appears well-developed and well-nourished and well groomed.   HEENT: Normocephalic, atraumatic, pupils are equal, round and reactive to light, extraocular tracking is good without limitation to gaze excursion or nystagmus noted.  Right eye exotropia.  Corrective eyeglasses in place.  Hearing is grossly intact. Face is symmetric with normal facial animation. Speech is clear with intermittent mild dysarthria noted. There is no hypophonia. There is no lip, neck/head, jaw or voice tremor. Neck is supple with full range of passive and active motion. There are no carotid bruits on auscultation. Oropharynx exam reveals: mild mouth dryness, adequate dental hygiene and moderate airway crowding, tonsillar size of about 1+, Mallampati class III, larger uvula noted.  Neck circumference 18 three-quarter inches.  Tongue protrudes centrally and palate elevates symmetrically.  No significant overbite.  Chest: Clear to auscultation without wheezing, rhonchi or crackles noted.  Heart: S1+S2+0, regular and normal without murmurs, rubs or gallops noted.   Abdomen: Soft, non-tender and non-distended.  Extremities: There is 2+ pitting edema in the distal lower extremities bilaterally.  Chronic appearing discoloration  in the distal lower extremities bilaterally as well as thickening of skin.  Skin: Warm and dry without trophic changes  noted.   Musculoskeletal: exam reveals no obvious joint deformities.   Neurologically:  Mental status: The patient is awake, alert and oriented in all 4 spheres. His immediate and remote memory, attention, language skills and fund of knowledge are appropriate. There is no evidence of aphasia, agnosia, apraxia or anomia. Speech is clear with normal prosody and enunciation. Thought process is linear. Mood is normal and affect is normal.  Cranial nerves II - XII are as described above under HEENT exam.  Motor exam: Normal bulk, strength and tone is noted. There is no obvious action or resting tremor.  Fine motor skills and coordination: Mild impairment noted with fine motor skills in the left upper extremity.    Cerebellar testing: No dysmetria or intention tremor. There is no truncal or gait ataxia.  Sensory exam: intact to light touch in the upper and lower extremities.  Gait, station and balance: He stands easily. No veering to one side is noted. No leaning to one side is noted. Posture is age-appropriate and stance is narrow based. Gait shows normal stride length and normal pace. No problems turning are noted.  No walking aid.  Assessment and Plan:  In summary, Kwali D Sans is a very pleasant 52 y.o.-year old male with an underlying medical history of lacunar strokes, intracerebral hemorrhage in August 2023, hypertension, chronic systolic CHF, poorly controlled diabetes, chronic kidney disease, and severe obesity with a BMI of over 40, who presents for evaluation of his obstructive sleep apnea.  He was diagnosed with moderate OSA in 2019.  He has had no PAP therapy secondary to not having insurance coverage for it.  He has had significant weight fluctuation, he has a family history of sleep apnea.  He would be willing to get reevaluated and consider PAP therapy.   I had a long  chat with the patient and his sister about my findings and the diagnosis of sleep apnea, particularly OSA, its prognosis and treatment options. We talked about medical/conservative treatments, surgical interventions and non-pharmacological approaches for symptom control. I explained, in particular, the risks and ramifications of untreated moderate to severe OSA, especially with respect to developing cardiovascular disease down the road, including congestive heart failure (CHF), difficult to treat hypertension, cardiac arrhythmias (particularly A-fib), neurovascular complications including TIA, stroke and dementia. Even type 2 diabetes has, in part, been linked to untreated OSA. Symptoms of untreated OSA may include (but may not be limited to) daytime sleepiness, nocturia (i.e. frequent nighttime urination), memory problems, mood irritability and suboptimally controlled or worsening mood disorder such as depression and/or anxiety, lack of energy, lack of motivation, physical discomfort, as well as recurrent headaches, especially morning or nocturnal headaches. We talked about the importance of maintaining a healthy lifestyle and striving for healthy weight.  I recommended a sleep study at this time. I outlined the differences between a laboratory attended sleep study which is considered more comprehensive and accurate over the option of a home sleep test (HST); the latter may lead to underestimation of sleep disordered breathing in some instances and does not help with diagnosing upper airway resistance syndrome and is not accurate enough to diagnose primary central sleep apnea typically. I outlined possible surgical and non-surgical treatment options of OSA, including the use of a positive airway pressure (PAP) device (i.e. CPAP, AutoPAP/APAP or BiPAP in certain circumstances), a custom-made dental device (aka oral appliance, which would require a referral to a specialist dentist or orthodontist typically, and is  generally speaking not considered for patients with  full dentures or edentulous state), upper airway surgical options, such as traditional UPPP (which is not considered a first-line treatment) or the Inspire device (hypoglossal nerve stimulator, which would involve a referral for consultation with an ENT surgeon, after careful selection, following inclusion criteria - also not first-line treatment). I explained the PAP treatment option to the patient in detail, as this is generally considered first-line treatment.  The patient indicated that he would be willing to try PAP therapy, if the need arises. I explained the importance of being compliant with PAP treatment, not only for insurance purposes but primarily to improve patient's symptoms symptoms, and for the patient's long term health benefit, including to reduce His cardiovascular risks longer-term.    We will pick up our discussion about the next steps and treatment options after testing.  We will keep them posted as to the test results by phone call and/or MyChart messaging where possible.  We will plan to follow-up in sleep clinic accordingly as well.  I answered all their questions today and the patient and his sister were in agreement.   I encouraged them to call with any interim questions, concerns, problems or updates or email [place] through MyChart.  Generally speaking, sleep test authorizations may take up to 2 weeks, sometimes less, sometimes longer, the patient is encouraged to get in touch with [place] if they do not hear back from the sleep lab staff directly within the next 2 weeks.  Thank you very much for allowing me to participate in the care of this nice patient. If I can be of any further assistance to you please do not hesitate to talk to me.  Sincerely,   Huston Foley, MD, PhD

## 2022-05-13 NOTE — Progress Notes (Deleted)
Spoke with patient to ask if he would like to come in on 05/13/22 and he said no.

## 2022-05-14 ENCOUNTER — Ambulatory Visit: Payer: BC Managed Care – PPO | Attending: Medical | Admitting: Medical

## 2022-05-14 ENCOUNTER — Ambulatory Visit (INDEPENDENT_AMBULATORY_CARE_PROVIDER_SITE_OTHER): Payer: BC Managed Care – PPO | Admitting: Physician Assistant

## 2022-05-14 ENCOUNTER — Encounter: Payer: Self-pay | Admitting: Physician Assistant

## 2022-05-14 ENCOUNTER — Encounter: Payer: Self-pay | Admitting: Medical

## 2022-05-14 VITALS — BP 91/64 | HR 88 | Wt 280.5 lb

## 2022-05-14 DIAGNOSIS — K5900 Constipation, unspecified: Secondary | ICD-10-CM | POA: Diagnosis not present

## 2022-05-14 NOTE — Progress Notes (Unsigned)
     I,Sha'taria Tyson,acting as a Education administrator for [employer], PA-C.,have documented all relevant documentation on the behalf of [name], PA-C,as directed by  [employer], PA-C while in the presence of [employer], PA-C.   Established patient visit   Patient: Jeremiah Vance   DOB: February 18, 1971   52 y.o. Male  MRN: [id number] Visit Date: 05/14/2022  Today's healthcare provider: [name], PA-C   No chief complaint on file.  Subjective    Constipation This is a new problem. The current episode started in the past 7 days. The problem is unchanged. His stool frequency is 4 to 5 times per week. The stool is described as watery. The patient is not on a high fiber diet. He Does not exercise regularly. There has Been adequate water intake. Associated symptoms include rectal pain.    ***  Medications: Outpatient Medications Prior to Visit  Medication Sig   apixaban (ELIQUIS) 5 MG TABS tablet Take 1 tablet (5 mg total) by mouth 2 (two) times daily. (Patient not taking: Reported on 05/01/2022)   atorvastatin (LIPITOR) 40 MG tablet Take 1 tablet (40 mg total) by mouth daily at 6 PM.   carvedilol (COREG) 12.5 MG tablet Take 1 tablet (12.5 mg total) by mouth 2 (two) times daily with a meal.   Continuous Blood Gluc Sensor (FREESTYLE LIBRE 2 SENSOR) MISC Place 1 sensor on the skin every 14 days. Use to check glucose continuously   Continuous Blood Gluc Sensor (FREESTYLE LIBRE 3 SENSOR) MISC Place 1 sensor on the skin every 14 days. Use to check glucose continuously   dapagliflozin propanediol (FARXIGA) 10 MG TABS tablet Take 1 tablet (10 mg) by mouth once daily   glimepiride (AMARYL) 4 MG tablet Take 1 tablet (4 mg total) by mouth daily with breakfast. (Patient not taking: Reported on 05/01/2022)   hydrALAZINE (APRESOLINE) 50 MG tablet Take 1 tablet (50 mg total) by mouth in the morning and at bedtime.   irbesartan (AVAPRO) 300 MG tablet Take 300 mg by mouth daily.   LANTUS 100 UNIT/ML  injection SMARTSIG:15 Unit(s) SUB-Q Daily   linagliptin (TRADJENTA) 5 MG TABS tablet Take 1 tablet (5 mg total) by mouth daily. (Patient not taking: Reported on 05/01/2022)   torsemide (DEMADEX) 20 MG tablet Take 2 tablets (40 mg total) by mouth daily.   No facility-administered medications prior to visit.    Review of Systems  Gastrointestinal:  Positive for constipation and rectal pain.    {Labs  Heme  Chem  Endocrine  Serology  Results Review (optional):23779}   Objective    There were no vitals taken for this visit. {Show previous vital signs (optional):23777}  Physical Exam  ***  No results found for any visits on 05/14/22.  Assessment & Plan     Constipation, unspecified constipation type Chronic Pt was advised: -Eliminate medications that cause constipation. -Increase fluid intake. -Increase soluble fiber (25-30g/day) in diet. -Encourage regular defecation attempts after eating. -Regular exercise -Enemas if other methods fail- Bulking agents (accompanied by adequate fluids)  Psyllium  (Konsyl, Metamucil, Perdiem Fiber): 1 tbsp (approx 3.5 grams fiber) in 8-oz liquid PO daily up to TID Pt instructions/high-fiber eating plan were provided   No follow-ups on file.      {provider attestation***:1}   [name], PA-C  [place] [phone] (phone) [phone] (fax)  [place]

## 2022-05-18 ENCOUNTER — Emergency Department: Payer: BC Managed Care – PPO

## 2022-05-18 ENCOUNTER — Emergency Department
Admission: EM | Admit: 2022-05-18 | Discharge: 2022-05-18 | Disposition: A | Discharge status: Home / Self Care | Payer: BC Managed Care – PPO | Attending: Emergency Medicine | Admitting: Emergency Medicine

## 2022-05-18 ENCOUNTER — Other Ambulatory Visit: Payer: Self-pay

## 2022-05-18 DIAGNOSIS — R079 Chest pain, unspecified: Secondary | ICD-10-CM

## 2022-05-18 DIAGNOSIS — E1122 Type 2 diabetes mellitus with diabetic chronic kidney disease: Secondary | ICD-10-CM | POA: Insufficient documentation

## 2022-05-18 DIAGNOSIS — R000 Tachycardia, unspecified: Secondary | ICD-10-CM | POA: Diagnosis not present

## 2022-05-18 DIAGNOSIS — F419 Anxiety disorder, unspecified: Secondary | ICD-10-CM | POA: Diagnosis not present

## 2022-05-18 DIAGNOSIS — R0689 Other abnormalities of breathing: Secondary | ICD-10-CM | POA: Diagnosis not present

## 2022-05-18 DIAGNOSIS — I509 Heart failure, unspecified: Secondary | ICD-10-CM | POA: Diagnosis not present

## 2022-05-18 DIAGNOSIS — I130 Hypertensive heart and chronic kidney disease with heart failure and stage 1 through stage 4 chronic kidney disease, or unspecified chronic kidney disease: Secondary | ICD-10-CM | POA: Insufficient documentation

## 2022-05-18 DIAGNOSIS — R0789 Other chest pain: Secondary | ICD-10-CM | POA: Diagnosis not present

## 2022-05-18 DIAGNOSIS — N189 Chronic kidney disease, unspecified: Secondary | ICD-10-CM | POA: Diagnosis not present

## 2022-05-18 LAB — BASIC METABOLIC PANEL
Anion gap: 10 (ref 5–15)
BUN: 31 mg/dL — ABNORMAL HIGH (ref 6–20)
CO2: 24 mmol/L (ref 22–32)
Calcium: 9 mg/dL (ref 8.9–10.3)
Chloride: 102 mmol/L (ref 98–111)
Creatinine, Ser: 1.71 mg/dL — ABNORMAL HIGH (ref 0.61–1.24)
GFR, Estimated: 48 mL/min — ABNORMAL LOW (ref >60)
Glucose, Bld: 197 mg/dL — ABNORMAL HIGH (ref 70–99)
Potassium: 4 mmol/L (ref 3.5–5.1)
Sodium: 136 mmol/L (ref 135–145)

## 2022-05-18 LAB — CBC
HCT: 34.9 % — ABNORMAL LOW (ref 39.0–52.0)
Hemoglobin: 11 g/dL — ABNORMAL LOW (ref 13.0–17.0)
MCH: 26.7 pg (ref 26.0–34.0)
MCHC: 31.5 g/dL (ref 30.0–36.0)
MCV: 84.7 fL (ref 80.0–100.0)
Platelets: 334 10*3/uL (ref 150–400)
RBC: 4.12 MIL/uL — ABNORMAL LOW (ref 4.22–5.81)
RDW: 15 % (ref 11.5–15.5)
WBC: 8.9 10*3/uL (ref 4.0–10.5)
nRBC: 0 % (ref 0.0–0.2)

## 2022-05-18 LAB — BRAIN NATRIURETIC PEPTIDE: B Natriuretic Peptide: 174.3 pg/mL — ABNORMAL HIGH (ref 0.0–100.0)

## 2022-05-18 LAB — TROPONIN I (HIGH SENSITIVITY)
Troponin I (High Sensitivity): 14 ng/L (ref <18)
Troponin I (High Sensitivity): 13 ng/L (ref <18)

## 2022-05-18 MED ORDER — NITROGLYCERIN 0.4 MG SL SUBL
0.4000 mg | SUBLINGUAL_TABLET | SUBLINGUAL | Status: DC | PRN
  Administered 2022-05-18: 0.4 mg via SUBLINGUAL
  Filled 2022-05-18: qty 1

## 2022-05-18 NOTE — ED Notes (Signed)
Pt verbalizes understanding of discharge instructions. Opportunity for questioning and answers were provided. Pt discharged from ED to home with sister.

## 2022-05-18 NOTE — ED Triage Notes (Signed)
Pt presents to ED via [facility] with c/o of CP while at work, pt is a Tourist information centre manager at [employer], pt has HX of a-fib and CHF.   EMS states they gave 324 ASA and 2 nitro sprays.   Pt also endorses no BM in 1.5 weeks. Pt is A&Ox4.

## 2022-05-18 NOTE — ED Provider Notes (Signed)
[facility] Provider Note    Event Date/Time   First MD Initiated Contact with Patient 05/18/22 1033     (approximate)   History   Chest Pain   HPI  Jeremiah Vance is a 52 y.o. male past medical tree significant for hypertension, morbid obesity, CHF, DM, prior CVA, OSA noncompliant on CPAP, CKD, who presents to the emergency department with chest pain or shortness of breath.  Patient states that he had chest pain or shortness of breath that started today.  Was given aspirin and nitroglycerin with EMS and had some improvement of his pain.  States is uncertain if this is similar to prior episodes.  Does endorse recently being seen at primary care physician office for constipation.  Endorses ongoing constipation.  Denies any cough.  Denies nausea or vomiting.  Ongoing mild chest pain at this time.  Denies recent falls or trauma.  Did not know any daily medications.  States he is not sure in his sister who he lives with and helps with his medications will be here shortly.     Physical Exam   Triage Vital Signs: ED Triage Vitals  Enc Vitals Group     BP      Pulse      Resp      Temp      Temp src      SpO2      Weight      Height      Head Circumference      Peak Flow      Pain Score      Pain Loc      Pain Edu?      Excl. in [place]?     Most recent vital signs: Vitals:   05/18/22 1230 05/18/22 1430  BP: 112/69 (!) 126/92  Pulse: 75 74  Resp: (!) 22 18  Temp:    SpO2: 98% 99%    Physical Exam Constitutional:      Appearance: He is well-developed. He is obese.  HENT:     Head: Atraumatic.  Eyes:     Conjunctiva/sclera: Conjunctivae normal.  Cardiovascular:     Rate and Rhythm: Rhythm irregular.  Pulmonary:     Effort: No respiratory distress.  Musculoskeletal:     Cervical back: Normal range of motion.  Skin:    General: Skin is warm.  Neurological:     Mental Status: He is alert. Mental status is at baseline.     IMPRESSION /  MDM / ASSESSMENT AND PLAN / ED COURSE  I reviewed the triage vital signs and the nursing notes.  Differential diagnosis including ACS, pneumonia, heart failure exacerbation, constipation, electrolyte abnormality, hyperglycemia  EKG  I, [name], the attending physician, personally viewed and interpreted this ECG.   Rate: 84  Rhythm: Atrial fibrillation  Axis: Normal  Intervals: Normal  ST&T Change: Nonspecific changes No significant change when compared to prior EKG on 01/16/2022  Atrial fibrillation while on cardiac telemetry.  RADIOLOGY I independently reviewed imaging, my interpretation of imaging: Chest x-ray  LABS (all labs ordered are listed, but only abnormal results are displayed) Labs interpreted as -    Labs Reviewed  BASIC METABOLIC PANEL - Abnormal; Notable for the following components:      Result Value   Glucose, Bld 197 (*)    BUN 31 (*)    Creatinine, Ser 1.71 (*)    GFR, Estimated 48 (*)    All other components within  normal limits  CBC - Abnormal; Notable for the following components:   RBC 4.12 (*)    Hemoglobin 11.0 (*)    HCT 34.9 (*)    All other components within normal limits  BRAIN NATRIURETIC PEPTIDE - Abnormal; Notable for the following components:   B Natriuretic Peptide 174.3 (*)    All other components within normal limits  URINALYSIS, ROUTINE W REFLEX MICROSCOPIC  CBG MONITORING, ED  TROPONIN I (HIGH SENSITIVITY)  TROPONIN I (HIGH SENSITIVITY)    TREATMENT  Nitroglycerin.  Received 2 nitroglycerin and aspirin 324 mg with EMS.  MDM    On reevaluation patient's chest pain or shortness of breath has resolved.  Sister is now at bedside and believes that he had a panic attack.  States worsening anxiety over the past couple of months since he had a stroke in August.  Chest pain-free at this time.  Serial troponins are negative.  No signs of heart failure on chest x-ray.  No wheezing on exam and no signs of pneumonia.  Clinical  picture is not consistent with a dissection.  Discussed close follow-up with his primary care provider and cardiologist.  Given return precautions for any recurrence of symptoms.   PROCEDURES:  Critical Care performed: No  Procedures  Patient's presentation is most consistent with acute presentation with potential threat to life or bodily function.   MEDICATIONS ORDERED IN ED: Medications  nitroGLYCERIN (NITROSTAT) SL tablet 0.4 mg (0.4 mg Sublingual Given 05/18/22 1100)    FINAL CLINICAL IMPRESSION(S) / ED DIAGNOSES   Final diagnoses:  Chest pain, unspecified type  Anxiety     Rx / DC Orders   ED Discharge Orders     None        Note:  This document was prepared using Dragon voice recognition software and may include unintentional dictation errors.   [name], MD 05/18/22 1623

## 2022-05-18 NOTE — ED Notes (Signed)
Pt states that nitro did not help his pain. No more nitro doses given at this time.

## 2022-05-20 ENCOUNTER — Emergency Department
Admission: EM | Admit: 2022-05-20 | Discharge: 2022-05-20 | Disposition: A | Discharge status: Home / Self Care | Payer: BC Managed Care – PPO | Attending: Emergency Medicine | Admitting: Emergency Medicine

## 2022-05-20 ENCOUNTER — Emergency Department: Payer: BC Managed Care – PPO

## 2022-05-20 ENCOUNTER — Other Ambulatory Visit: Payer: Self-pay

## 2022-05-20 ENCOUNTER — Encounter: Payer: Self-pay | Admitting: Radiology

## 2022-05-20 DIAGNOSIS — R0902 Hypoxemia: Secondary | ICD-10-CM | POA: Diagnosis not present

## 2022-05-20 DIAGNOSIS — R531 Weakness: Secondary | ICD-10-CM | POA: Insufficient documentation

## 2022-05-20 DIAGNOSIS — R471 Dysarthria and anarthria: Secondary | ICD-10-CM

## 2022-05-20 DIAGNOSIS — R000 Tachycardia, unspecified: Secondary | ICD-10-CM | POA: Diagnosis not present

## 2022-05-20 DIAGNOSIS — I959 Hypotension, unspecified: Secondary | ICD-10-CM | POA: Diagnosis not present

## 2022-05-20 DIAGNOSIS — R0689 Other abnormalities of breathing: Secondary | ICD-10-CM | POA: Diagnosis not present

## 2022-05-20 LAB — DIFFERENTIAL
Abs Immature Granulocytes: 0.02 10*3/uL (ref 0.00–0.07)
Basophils Absolute: 0 10*3/uL (ref 0.0–0.1)
Basophils Relative: 0 %
Eosinophils Absolute: 0.2 10*3/uL (ref 0.0–0.5)
Eosinophils Relative: 2 %
Immature Granulocytes: 0 %
Lymphocytes Relative: 19 %
Lymphs Abs: 1.7 10*3/uL (ref 0.7–4.0)
Monocytes Absolute: 0.7 10*3/uL (ref 0.1–1.0)
Monocytes Relative: 8 %
Neutro Abs: 6.4 10*3/uL (ref 1.7–7.7)
Neutrophils Relative %: 71 %

## 2022-05-20 LAB — COMPREHENSIVE METABOLIC PANEL
ALT: 16 U/L (ref 0–44)
AST: 22 U/L (ref 15–41)
Albumin: 3.4 g/dL — ABNORMAL LOW (ref 3.5–5.0)
Alkaline Phosphatase: 67 U/L (ref 38–126)
Anion gap: 9 (ref 5–15)
BUN: 30 mg/dL — ABNORMAL HIGH (ref 6–20)
CO2: 25 mmol/L (ref 22–32)
Calcium: 8.9 mg/dL (ref 8.9–10.3)
Chloride: 104 mmol/L (ref 98–111)
Creatinine, Ser: 1.63 mg/dL — ABNORMAL HIGH (ref 0.61–1.24)
GFR, Estimated: 50 mL/min — ABNORMAL LOW (ref >60)
Glucose, Bld: 164 mg/dL — ABNORMAL HIGH (ref 70–99)
Potassium: 5 mmol/L (ref 3.5–5.1)
Sodium: 138 mmol/L (ref 135–145)
Total Bilirubin: 0.7 mg/dL (ref 0.3–1.2)
Total Protein: 7.2 g/dL (ref 6.5–8.1)

## 2022-05-20 LAB — APTT: aPTT: 30 seconds (ref 24–36)

## 2022-05-20 LAB — CBC
HCT: 35.5 % — ABNORMAL LOW (ref 39.0–52.0)
Hemoglobin: 10.9 g/dL — ABNORMAL LOW (ref 13.0–17.0)
MCH: 26.3 pg (ref 26.0–34.0)
MCHC: 30.7 g/dL (ref 30.0–36.0)
MCV: 85.7 fL (ref 80.0–100.0)
Platelets: 350 10*3/uL (ref 150–400)
RBC: 4.14 MIL/uL — ABNORMAL LOW (ref 4.22–5.81)
RDW: 15.5 % (ref 11.5–15.5)
WBC: 9.1 10*3/uL (ref 4.0–10.5)
nRBC: 0 % (ref 0.0–0.2)

## 2022-05-20 LAB — CBG MONITORING, ED: Glucose-Capillary: 145 mg/dL — ABNORMAL HIGH (ref 70–99)

## 2022-05-20 LAB — PROTIME-INR
INR: 1.2 (ref 0.8–1.2)
Prothrombin Time: 14.6 seconds (ref 11.4–15.2)

## 2022-05-20 LAB — ETHANOL: Alcohol, Ethyl (B): <10 mg/dL (ref <10)

## 2022-05-20 MED ORDER — IOHEXOL 350 MG/ML SOLN
100.0000 mL | Freq: Once | INTRAVENOUS | Status: AC | PRN
  Administered 2022-05-20: 75 mL via INTRAVENOUS

## 2022-05-20 MED ORDER — SODIUM CHLORIDE 0.9% FLUSH
3.0000 mL | Freq: Once | INTRAVENOUS | Status: AC
  Administered 2022-05-20: 3 mL via INTRAVENOUS

## 2022-05-20 NOTE — Consult Note (Signed)
Neurology Consultation  Reason for Consult: Code stroke for left-sided weakness and speech difficulty Referring Physician: Dr. Cheri Fowler  CC: Worsening left-sided weakness, slurred speech  History is obtained from: Patient, chart  HPI: Jeremiah Vance is a 52 y.o. 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  He reports that his last known well was 8 AM, he got to work, his colleagues felt that he was not at his baseline but he still continued to work and later on in the day it seemed like his left-sided weakness had become worse and his speech has also become worse for which EMS was called.  EMS noted left-sided weakness and slurred speech.  Code stroke was activated and he was brought in for emergent evaluation. On my examination, he had some pain limited left-sided weakness and slurred speech-see detailed exam below. He did not exhibit florid signs of ELVO but vessel imaging was obtained to rule out any underlying emergent large vessel occlusion-which was negative. He denies any recent sick contacts. He has been having chest pains.  He was seen in the emergency room on 05/18/2022 for evaluation of chest pain, which was thought to be related to an anxiety attack, preliminary cardiac workup in the ER was negative. Of note systolic blood sugars were in the 90s for the EMTs on scene.   LKW: 8 AM IV thrombolysis given?: no, outside the window EVT: No-no ELVO Premorbid modified Rankin scale (mRS): 2   ROS: Full ROS was performed and is negative except as noted in the HPI.   Past  Medical History:  Diagnosis Date   Acute ischemic stroke ([place]) 09/06/2017   Acute renal failure superimposed on stage 3a chronic kidney disease ([place]) 07/08/2019   Acute right-sided weakness    Allergies    Anasarca    Anemia 07/10/2017   Arthritis    CHF (congestive heart failure) ([place])    "coinsided w/kidney problems I was having 06/2017"   Chicken pox    CKD (chronic kidney disease) stage 3, GFR 30-59 ml/min (HCC) 06/2017   Depression    Elevated troponin 07/08/2019   High cholesterol    History of cardiomyopathy    LVEF 40 to 45% in April 2019 - subsequently normalized   Hyperbilirubinemia 07/10/2017   Hypertension    Ischemic stroke ([place])    Small left internal capsule infarct due to lacunar disease   Morbid obesity ([place])    Normocytic anemia 12/16/2017   Recurrent incisional hernia with incarceration s/p repair 10/22/2017 10/21/2017   Stroke ([place]) 08/2017   "right sided weakness since; getting stronger though" (10/22/2017)   Type 2 diabetes mellitus ([place])      Family History  Problem Relation Age of Onset   Diabetes Mother    Stroke Mother    Arthritis Mother    Depression Mother    Heart disease Mother    Hypertension Mother    Learning disabilities Mother    Mental illness Mother    Sleep apnea Mother    Diabetes Father    Heart disease Father    Arthritis Father    Hearing loss Father    Hyperlipidemia Father  Heart attack Father    Hypertension Father    Stroke Father    Diabetes Sister    Depression Sister    Diabetes Sister    Hypertension Sister    Mental illness Sister    Sleep apnea Sister    Diabetes Maternal Grandmother    Heart disease Maternal Grandmother    Depression Maternal Grandmother    Hyperlipidemia Maternal Grandmother    Hypertension Maternal Grandmother    Stroke Maternal Grandmother    Hypertension Maternal Grandfather    Hyperlipidemia Maternal Grandfather    Stroke Maternal Grandfather    Arthritis Paternal Grandmother    Hearing  loss Paternal Grandmother    Stroke Paternal Grandfather    Heart disease Paternal Grandfather    Arthritis Paternal Grandfather    Heart attack Paternal Grandfather      Social History:   reports that he has never smoked. He has never used smokeless tobacco. He reports that he does not currently use alcohol. He reports that he does not use drugs.  Medications No current facility-administered medications for this encounter.  Current Outpatient Medications:    apixaban (ELIQUIS) 5 MG TABS tablet, Take 1 tablet (5 mg total) by mouth 2 (two) times daily., Disp: 60 tablet, Rfl: 11   atorvastatin (LIPITOR) 40 MG tablet, Take 1 tablet (40 mg total) by mouth daily at 6 PM., Disp: 90 tablet, Rfl: 3   carvedilol (COREG) 12.5 MG tablet, Take 1 tablet (12.5 mg total) by mouth 2 (two) times daily with a meal., Disp: 60 tablet, Rfl: 3   Continuous Blood Gluc Sensor (FREESTYLE LIBRE 2 SENSOR) MISC, Place 1 sensor on the skin every 14 days. Use to check glucose continuously, Disp: 2 each, Rfl: 11   Continuous Blood Gluc Sensor (FREESTYLE LIBRE 3 SENSOR) MISC, Place 1 sensor on the skin every 14 days. Use to check glucose continuously, Disp: 2 each, Rfl: 11   dapagliflozin propanediol (FARXIGA) 10 MG TABS tablet, Take 1 tablet (10 mg) by mouth once daily, Disp: 30 tablet, Rfl: 6   glimepiride (AMARYL) 4 MG tablet, Take 1 tablet (4 mg total) by mouth daily with breakfast., Disp: 30 tablet, Rfl: 3   hydrALAZINE (APRESOLINE) 50 MG tablet, Take 1 tablet (50 mg total) by mouth in the morning and at bedtime., Disp: 60 tablet, Rfl: 5   irbesartan (AVAPRO) 300 MG tablet, Take 300 mg by mouth daily., Disp: , Rfl:    LANTUS 100 UNIT/ML injection, SMARTSIG:15 Unit(s) SUB-Q Daily, Disp: , Rfl:    linagliptin (TRADJENTA) 5 MG TABS tablet, Take 1 tablet (5 mg total) by mouth daily., Disp: 30 tablet, Rfl: 3   torsemide (DEMADEX) 20 MG tablet, Take 2 tablets (40 mg total) by mouth daily., Disp: 90 tablet, Rfl:  3   Exam: Current vital signs: BP 95/67 (BP Location: Right Arm)   Pulse 67   Temp 97.6 F (36.4 C) (Oral)   Resp (!) 23   Ht 5' 10"$  (1.778 m)   Wt 132.5 kg   SpO2 100%   BMI 41.91 kg/m  Vital signs in last 24 hours: Temp:  [97.6 F (36.4 C)] 97.6 F (36.4 C) (02/19 1423) Pulse Rate:  [67] 67 (02/19 1423) Resp:  [23] 23 (02/19 1423) BP: (95)/(67) 95/67 (02/19 1423) SpO2:  [100 %] 100 % (02/19 1423) Weight:  [132.5 kg] 132.5 kg (02/19 1422) General: Awake alert in no apparent distress HEENT: Normocephalic atraumatic Lungs: Clear Cardiovascular: Regular rate rhythm Abdomen nondistended nontender Extremities: Bilateral lower  extremities show nonpitting edema with chronic venous stasis changes Neurological exam He is awake alert oriented x 3 His speech is moderately dysarthric There is no evidence of aphasia although he has poor attention concentration Cranial nerve examination: Pupillary examination: Left pupil is somewhat asymmetric but reacts to light and is about 1 mm bigger than the right, the right pupil is round and normally reactive to light, visual field examination is challenging because he is blind in his left eye but was hard for me to ascertain whether his nasal fields are diminished on his right eye, face appears symmetric, facial sensation intact, tongue and palate midline. Motor examination with generalized weakness in all 4 extremities.  Left upper extremity exam is limited by pain and he only lifts his arm about 40 degree at the shoulder but he can keep the arm without any drift at that point.  Beyond that, he has a lot of pain.  Both legs are barely antigravity.  They fall to the bed immediately.  Right upper extremities. Sensation: Diminished on the left Coordination with no dysmetria NIH stroke scale 1a Level of Conscious.: 0 1b LOC Questions: 0 1c LOC Commands: 0 2 Best Gaze: 0 3 Visual: 0 4 Facial Palsy: 0 5a Motor Arm - left: 1 5b Motor Arm - Right:  0 6a Motor Leg - Left: 3 6b Motor Leg - Right: 3 7 Limb Ataxia: 0 8 Sensory: 1 9 Best Language: 0 10 Dysarthria: 2 11 Extinct. and Inatten.: 0 TOTAL: 10  Labs I have reviewed labs in epic and the results pertinent to this consultation are:  CBC    Component Value Date/Time   WBC 9.1 05/20/2022 1352   RBC 4.14 (L) 05/20/2022 1352   HGB 10.9 (L) 05/20/2022 1352   HGB 11.9 (L) 01/22/2022 1051   HCT 35.5 (L) 05/20/2022 1352   HCT 36.9 (L) 01/22/2022 1051   PLT 350 05/20/2022 1352   PLT 369 01/22/2022 1051   MCV 85.7 05/20/2022 1352   MCV 87 01/22/2022 1051   MCH 26.3 05/20/2022 1352   MCHC 30.7 05/20/2022 1352   RDW 15.5 05/20/2022 1352   RDW 12.9 01/22/2022 1051   LYMPHSABS 1.7 05/20/2022 1352   LYMPHSABS 1.9 01/22/2022 1051   MONOABS 0.7 05/20/2022 1352   EOSABS 0.2 05/20/2022 1352   EOSABS 0.3 01/22/2022 1051   BASOSABS 0.0 05/20/2022 1352   BASOSABS 0.1 01/22/2022 1051   CMP     Component Value Date/Time   NA 138 05/20/2022 1352   NA 140 01/22/2022 1051   K 5.0 05/20/2022 1352   CL 104 05/20/2022 1352   CO2 25 05/20/2022 1352   GLUCOSE 164 (H) 05/20/2022 1352   BUN 30 (H) 05/20/2022 1352   BUN 17 01/22/2022 1051   CREATININE 1.63 (H) 05/20/2022 1352   CALCIUM 8.9 05/20/2022 1352   PROT 7.2 05/20/2022 1352   PROT 6.9 01/22/2022 1051   ALBUMIN 3.4 (L) 05/20/2022 1352   ALBUMIN 3.5 (L) 01/22/2022 1051   AST 22 05/20/2022 1352   ALT 16 05/20/2022 1352   ALKPHOS 67 05/20/2022 1352   BILITOT 0.7 05/20/2022 1352   BILITOT 0.6 01/22/2022 1051   GFRNONAA 50 (L) 05/20/2022 1352   GFRAA 72 05/19/2020 1139   Lipid Panel     Component Value Date/Time   CHOL 106 01/16/2022 0518   CHOL 154 05/19/2020 1139   TRIG 104 01/16/2022 0518   HDL 24 (L) 01/16/2022 0518   HDL 39 (L) 05/19/2020 1139  CHOLHDL 4.4 01/16/2022 0518   VLDL 21 01/16/2022 0518   LDLCALC 61 01/16/2022 0518   LDLCALC 100 (H) 05/19/2020 1139     Imaging I have reviewed the images  obtained:  CT-head no acute changes. CT angio head and neck: No evidence of emergent large vessel occlusion.  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  Multifocal irregularity and moderate narrowing of the right vertebral artery that has mildly progressed from prior.  CT perfusion study has what appears to be watershed looking areas of penumbra of about 14 cc in bilateral hemispheres-anterior and posterior watershed on the right and posterior watershed on the left.  Assessment:  52 year old man with above past medical history including that of prior strokes with some left-sided residual weakness, A-fib on Eliquis, hypertension, hyperlipidemia, systolic heart failure amongst other comorbidities presenting for worsening left-sided weakness and slurred speech.  Last known well is outside the window for IV thrombolysis as well as being on Eliquis precludes him from thrombolysis. On examination he had some pain related left-sided arm weakness and bilateral symmetric lower extremity weakness, along with some dysarthria and mild left-sided sensory deficit-likely baseline. Exam was not suggestive of an emergent LVO and the CT angio also does not reveal an emergent LVO but the perfusion pattern does reveal some watershed nature to it. Given his prior history of strokes, as well as A-fib-it is prudent to do an MRI and make sure he is not having an acute stroke, especially because he was hypotensive on EMS arrival Other differentials include recrudescence of stroke symptoms versus anxiety related worsening of his symptoms.  Recommendations: Check UA and chest x-ray MRI of the brain without contrast If the MRI shows a stroke, will need to hold his Eliquis and will need to consider repeating stroke risk factor workup to include 2D echocardiogram and repeat therapy evaluations. If the MRI is negative for stroke, no further inpatient workup would be needed and he can continue on his home  Eliquis. Follow-up with outpatient neurology and cardiology. Plan was discussed with Dr. Cheri Fowler  -- Amie Portland, MD Neurologist [facility] Pager: [phone]

## 2022-05-20 NOTE — ED Provider Notes (Signed)
[facility] Provider Note   Event Date/Time   First MD Initiated Contact with Patient 05/20/22 1507     (approximate) History  Weakness (Pt arrives via EMS w/c/o left side weakness. Pt has hx of previous CVA and states this feels the same./)  HPI Jeremiah Vance is a 52 y.o. male with stated past medical history of CVA who presents for left upper and lower extremity weakness and slurred speech.  Patient states that the symptoms began at approximately 8 AM this morning.  Patient denies symptoms improving over this time. ROS: Patient currently denies any vision changes, tinnitus, difficulty speaking, facial droop, sore throat, chest pain, shortness of breath, abdominal pain, nausea/vomiting/diarrhea, dysuria   Physical Exam  Triage Vital Signs: ED Triage Vitals  Enc Vitals Group     BP 05/20/22 1423 95/67     Pulse Rate 05/20/22 1423 67     Resp 05/20/22 1423 (!) 23     Temp 05/20/22 1423 97.6 F (36.4 C)     Temp Source 05/20/22 1423 Oral     SpO2 05/20/22 1423 100 %     Weight 05/20/22 1422 292 lb 1.8 oz (132.5 kg)     Height 05/20/22 1422 5' 10"$  (1.778 m)     Head Circumference --      Peak Flow --      Pain Score 05/20/22 1423 0     Pain Loc --      Pain Edu? --      Excl. in [place]? --    Most recent vital signs: Vitals:   05/20/22 1430 05/20/22 1445  BP: 104/64   Pulse: 65 63  Resp: (!) 23 19  Temp:    SpO2: 99% 95%   General: Awake, oriented x4. CV:  Good peripheral perfusion.  Resp:  Normal effort.  Abd:  No distention.  Other:  Middle-aged obese Caucasian male laying in bed in no acute distress.  NIH stroke scale 10 ED Results / Procedures / Treatments  Labs (all labs ordered are listed, but only abnormal results are displayed) Labs Reviewed  CBC - Abnormal; Notable for the following components:      Result Value   RBC 4.14 (*)    Hemoglobin 10.9 (*)    HCT 35.5 (*)    All other components within normal limits  COMPREHENSIVE  METABOLIC PANEL - Abnormal; Notable for the following components:   Glucose, Bld 164 (*)    BUN 30 (*)    Creatinine, Ser 1.63 (*)    Albumin 3.4 (*)    GFR, Estimated 50 (*)    All other components within normal limits  CBG MONITORING, ED - Abnormal; Notable for the following components:   Glucose-Capillary 145 (*)    All other components within normal limits  PROTIME-INR  APTT  DIFFERENTIAL  ETHANOL  CBG MONITORING, ED   EKG ED ECG REPORT I, Naaman Plummer, the attending physician, personally viewed and interpreted this ECG. Date: 05/20/2022 EKG Time: 1425 Rate: 74 Rhythm: Atrial fibrillation QRS Axis: normal Intervals: normal ST/T Wave abnormalities: normal Narrative Interpretation: Atrial fibrillation.  No evidence of acute ischemia RADIOLOGY ED MD interpretation: CT of the head without contrast interpreted by me shows no evidence of acute abnormalities including no intracerebral hemorrhage, obvious masses, or significant edema -Agree with radiology assessment Official radiology report(s): CT ANGIO HEAD NECK W WO CM W PERF (CODE STROKE)  Result Date: 05/20/2022 CLINICAL DATA:  Neuro deficit, acute, stroke suspected EXAM:  CT ANGIOGRAPHY HEAD AND NECK CT PERFUSION BRAIN TECHNIQUE: Multidetector CT imaging of the head and neck was performed using the standard protocol during bolus administration of intravenous contrast. Multiplanar CT image reconstructions and MIPs were obtained to evaluate the vascular anatomy. Carotid stenosis measurements (when applicable) are obtained utilizing NASCET criteria, using the distal internal carotid diameter as the denominator. Multiphase CT imaging of the brain was performed following IV bolus contrast injection. Subsequent parametric perfusion maps were calculated using RAPID software. RADIATION DOSE REDUCTION: This exam was performed according to the departmental dose-optimization program which includes automated exposure control, adjustment of  the mA and/or kV according to patient size and/or use of iterative reconstruction technique. CONTRAST:  89m OMNIPAQUE IOHEXOL 350 MG/ML SOLN COMPARISON:  CTA head/neck 11/25/2021. FINDINGS: CTA NECK FINDINGS Aortic arch: Great vessel origins are patent without significant stenosis. Right carotid system: No evidence of dissection, stenosis (50% or greater), or occlusion. Tortuous ICA at the skull base. Left carotid system: No evidence of dissection, stenosis (50% or greater), or occlusion. Vertebral arteries: Left dominant. Multifocal irregularity and moderate narrowing of the right vertebral artery, mildly progressed. Multifocal mild irregularity the left vertebral artery. Skeleton: No acute findings. Other neck: No acute findings. Upper chest: Clear lung apices. Review of the MIP images confirms the above findings CTA HEAD FINDINGS Anterior circulation: Bilateral intracranial ICAs, MCAs, and ACAs are patent without proximal hemodynamically significant stenosis. Posterior circulation: Similar severe left intradural vertebral artery stenosis due to atherosclerosis. Bilateral intradural vertebral arteries and basilar artery are patent. Similar appearance of a fetal type left PCA. Both PCAs are patent without proximal high-grade stenosis. Venous sinuses: As permitted by contrast timing, patent. Anatomic variants: Detailed above. CT Brain Perfusion Findings: CBF (<30%) Volume: 065mPerfusion (Tmax>6.0s) volume: 1429mismatch Volume: 57m89mfarction Location:No core infarct identified. IMPRESSION: CTA: 1. No emergent large vessel occlusion. 2. 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 3. Multifocal irregularity and moderate narrowing of the right vertebral artery, mildly progressed. CT perfusion: Rapid reports 14 mL of reported penumbra in the right frontoparietal and left occipital lobes, likely watershed. Recommend attention on forthcoming MRI. Findings discussed with Dr. ArorRory Percy telephone at 2:35  p.m. Electronically Signed   By: FredMargaretha Sheffield.   On: 05/20/2022 14:48   CT HEAD CODE STROKE WO CONTRAST  Result Date: 05/20/2022 CLINICAL DATA:  Code stroke.  Neuro deficit, acute, stroke suspected EXAM: CT HEAD WITHOUT CONTRAST TECHNIQUE: Contiguous axial images were obtained from the base of the skull through the vertex without intravenous contrast. RADIATION DOSE REDUCTION: This exam was performed according to the departmental dose-optimization program which includes automated exposure control, adjustment of the mA and/or kV according to patient size and/or use of iterative reconstruction technique. COMPARISON:  None Available. FINDINGS: Brain: Remote scattered small white matter and bilateral thalamic infarcts. No evidence of acute large vascular territory. No evidence of acute hemorrhage, mass lesion, midline shift or hydrocephalus. Vascular: No hyperdense vessel identified. Skull: No acute fracture. Sinuses/Orbits: Clear sinuses.  No acute orbital findings. Other: No mastoid effusions. ASPECTS ([facility] Early CT Score) total score (0-10 with 10 being normal): 10. IMPRESSION: 1. No evidence of acute intracranial abnormality. 2. ASPECTS 10. Code stroke imaging results were communicated on 05/20/2022 at 2:04 pm to provider Dr. ArorRory Percy secure text paging. Electronically Signed   By: FredMargaretha Sheffield.   On: 05/20/2022 14:04   PROCEDURES: Critical Care performed: No .1-3 Lead EKG Interpretation  Performed by: BradNaaman Plummer Authorized  by: Naaman Plummer, MD     Interpretation: abnormal     ECG rate:  71   ECG rate assessment: normal     Rhythm: atrial fibrillation     Ectopy: none     Conduction: normal    MEDICATIONS ORDERED IN ED: Medications  sodium chloride flush (NS) 0.9 % injection 3 mL (3 mLs Intravenous Given 05/20/22 1427)  iohexol (OMNIPAQUE) 350 MG/ML injection 100 mL (75 mLs Intravenous Contrast Given 05/20/22 1408)   IMPRESSION / MDM / ASSESSMENT  AND PLAN / ED COURSE  I reviewed the triage vital signs and the nursing notes.                             The patient is on the cardiac monitor to evaluate for evidence of arrhythmia and/or significant heart rate changes. Patient's presentation is most consistent with acute presentation with potential threat to life or bodily function. Stroke alert PMH risk factors: Previous CVA, hypertension, A-fib Neurologic Deficits: Left upper and lower extremity weakness, slurred speech Last known Well Time: 0800 NIH Stroke Score: 10 Given History and Exam I have lower suspicion for infectious etiology, neurologic changes secondary to toxicologic ingestion, seizure, complex migraine. Presentation concerning for possible stroke requiring workup.  Workup: Labs: POC glucose, CBC, BMP, LFTs, Troponin, PT/INR, PTT, Type and Screen Other Diagnostics: ECG, CXR, non-contrast head CT followed by CTA brain and neck (to r/o large vessel occlusion amenable to thrombectomy) Interventions: Patient low on NIH scale and out of the window for tpa.  Consult: Neurology. Discussed with Dr. Malen Gauze regarding patients neurological symptoms and last well-known time and eligibility for TPA criteria. Disposition: Care of this patient will be signed out to the oncoming physician at the end of my shift.  All pertinent patient information conveyed and all questions answered.  All further care and disposition decisions will be made by the oncoming physician. Clinical Course as of 05/20/22 1552  Mon May 20, 2022  1505 Prior CVA with soft blood pressures - reactivation of old CVA.  CTA negative. Code stroke and if MRI negative then will dc home.  [SM]    Clinical Course User Index [SM] [name], MD   FINAL CLINICAL IMPRESSION(S) / ED DIAGNOSES   Final diagnoses:  Left-sided weakness  Dysarthria   Rx / DC Orders   ED Discharge Orders     None      Note:  This document was prepared using Dragon voice recognition  software and may include unintentional dictation errors.   Naaman Plummer, MD 05/20/22 [phone]

## 2022-05-20 NOTE — Progress Notes (Signed)
Stroke Response Nurse Documentation Code Documentation  Jeremiah Vance is a 52 y.o. male arriving to [facility] via [facility] on 05/20/2022 with past medical hx of obesity, HTN, cardiomyopathy, CHF, CKD-3, previous stroke with left sided weakness, increased cholesterol, hyperbilirubinemia. On No antithrombotic. Code stroke was activated by EMS.   Patient from work where he was LKW at 0800 and now complaining of worsening left sided weakness. Patient reports he was in his normal state of health when he woke up at 0800. While working at [employer], he noticed he had left sided weakness, worse than his baseline weakness, and slurred speech. EMS called by co-workers. EMS reports   Stroke team at the bedside on patient arrival. Labs drawn. Patient to CT with team. NIHSS 12, see documentation for details and code stroke times. Patient with left hemianopia, left arm weakness, bilateral leg weakness, left decreased sensation, and dysarthria  on exam. The following imaging was completed:  CT Head, CTA, and CTP. Patient is not a candidate for IV Thrombolytic due to outside window per MD. Patient is not a candidate for IR due to LVO not seen on imaging per MD.  Care Plan: q2h NIHSS and vital signs, MRI ordered, stroke swallow per protocol.   Bedside handoff with ED RN Joelene Millin.    Charise Carwin  Stroke Response RN

## 2022-05-20 NOTE — ED Provider Notes (Signed)
Care assumed of patient from outgoing provider.  See their note for initial history, exam and plan.  Clinical Course as of 05/20/22 1559  Mon May 20, 2022  1505 Prior CVA with soft blood pressures - reactivation of old CVA.  CTA negative. Code stroke and if MRI negative then will dc home.  [SM]    Clinical Course User Index [SM] [name], MD  MRI negative.  Patient states that he is feeling better.  Blood pressure improved while in the emergency department.  Last blood pressure [id number] systolic.  Patient discharged home in stable condition with discussion to follow-up as an outpatient and return precautions given.   [name], MD 05/20/22 1733

## 2022-05-20 NOTE — Discharge Instructions (Signed)
You were seen in the emergency department for strokelike symptoms.  You had an MRI done that did not show a new stroke.  Concerned that your symptoms were from reactivation of your old stroke.  Your blood pressure improved in the emergency department.  Follow-up closely with your primary care physician and return to the emergency department for any worsening symptoms.

## 2022-05-20 NOTE — Progress Notes (Signed)
CODE STROKE- PHARMACY COMMUNICATION   Time CODE STROKE called/page received:1351  Time response to CODE STROKE was made (in person or via phone): 1355  Time Stroke Kit retrieved from [place] (only if needed):N/A  Name of Provider/Nurse contacted:Dr. Rory Percy  Past Medical History:  Diagnosis Date   Acute ischemic stroke ([place]) 09/06/2017   Acute renal failure superimposed on stage 3a chronic kidney disease ([place]) 07/08/2019   Acute right-sided weakness    Allergies    Anasarca    Anemia 07/10/2017   Arthritis    CHF (congestive heart failure) ([place])    "coinsided w/kidney problems I was having 06/2017"   Chicken pox    CKD (chronic kidney disease) stage 3, GFR 30-59 ml/min (HCC) 06/2017   Depression    Elevated troponin 07/08/2019   High cholesterol    History of cardiomyopathy    LVEF 40 to 45% in April 2019 - subsequently normalized   Hyperbilirubinemia 07/10/2017   Hypertension    Ischemic stroke ([place])    Small left internal capsule infarct due to lacunar disease   Morbid obesity ([place])    Normocytic anemia 12/16/2017   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 Authorizing Provider  apixaban (ELIQUIS) 5 MG TABS tablet Take 1 tablet (5 mg total) by mouth 2 (two) times daily. 04/19/22   Gerrie Nordmann, NP  atorvastatin (LIPITOR) 40 MG tablet Take 1 tablet (40 mg total) by mouth daily at 6 PM. 11/26/21   Fritzi Mandes, MD  carvedilol (COREG) 12.5 MG tablet Take 1 tablet (12.5 mg total) by mouth 2 (two) times daily with a meal. 11/26/21   Fritzi Mandes, MD  Continuous Blood Gluc Sensor (FREESTYLE LIBRE 2 SENSOR) MISC Place 1 sensor on the skin every 14 days. Use to check glucose continuously 05/01/22   [name], PA-C  Continuous Blood Gluc Sensor (FREESTYLE  LIBRE 3 SENSOR) MISC Place 1 sensor on the skin every 14 days. Use to check glucose continuously 03/20/22   [name], PA-C  dapagliflozin propanediol (FARXIGA) 10 MG TABS tablet Take 1 tablet (10 mg) by mouth once daily 01/08/22   Furth, Cadence H, PA-C  glimepiride (AMARYL) 4 MG tablet Take 1 tablet (4 mg total) by mouth daily with breakfast. 11/27/21   Fritzi Mandes, MD  hydrALAZINE (APRESOLINE) 50 MG tablet Take 1 tablet (50 mg total) by mouth in the morning and at bedtime. 12/25/21   Furth, Cadence H, PA-C  irbesartan (AVAPRO) 300 MG tablet Take 300 mg by mouth daily.    [provider]  LANTUS 100 UNIT/ML injection SMARTSIG:15 Unit(s) SUB-Q Daily 02/03/22   [provider]  linagliptin (TRADJENTA) 5 MG TABS tablet Take 1 tablet (5 mg total) by mouth daily. 11/27/21   Fritzi Mandes, MD  torsemide (DEMADEX) 20 MG tablet Take 2 tablets (40 mg total) by mouth daily. 11/26/21   Fritzi Mandes, MD    Alison Murray ,PharmD Clinical Pharmacist  05/20/2022  2:20 PM

## 2022-05-20 NOTE — Progress Notes (Signed)
   05/20/22 1400  Spiritual Encounters  Type of Visit Initial  Care provided to: Patient  Conversation partners present during encounter Nurse  Referral source Code page  Reason for visit Code  OnCall Visit Yes  Spiritual Framework  Presenting Themes Significant life change  Interventions  Spiritual Care Interventions Made Compassionate presence  Intervention Outcomes  Outcomes Reduced isolation   This Chap responded to code stroke at ED. Pt at the hallway with [facility] team attending. Melven Sartorius offered a calming presence and a silent prayer for the pt. Pt taken to CT Scan. Kindly page the [place] when family arrives.

## 2022-05-20 NOTE — Progress Notes (Signed)
[facility] 1344  Per EMS pt has abnormal speech with worsened L sided weakness. They state that pt reports being normal at 0800 but that no one was with pt or spoke to pt at that time.   Dr. Rory Percy at bedside 1347 Pt to CT 1352 - Per Dr. Rory Percy telestroke no longer needed at this time

## 2022-05-23 ENCOUNTER — Ambulatory Visit: Payer: BC Managed Care – PPO | Admitting: Physician Assistant

## 2022-05-23 VITALS — BP 83/55 | HR 77 | Temp 98.3°F | Wt 273.0 lb

## 2022-05-23 DIAGNOSIS — F4321 Adjustment disorder with depressed mood: Secondary | ICD-10-CM | POA: Diagnosis not present

## 2022-05-23 DIAGNOSIS — F419 Anxiety disorder, unspecified: Secondary | ICD-10-CM | POA: Diagnosis not present

## 2022-05-23 DIAGNOSIS — I952 Hypotension due to drugs: Secondary | ICD-10-CM | POA: Diagnosis not present

## 2022-05-23 DIAGNOSIS — R531 Weakness: Secondary | ICD-10-CM | POA: Diagnosis not present

## 2022-05-23 DIAGNOSIS — F32A Depression, unspecified: Secondary | ICD-10-CM

## 2022-05-23 DIAGNOSIS — Z09 Encounter for follow-up examination after completed treatment for conditions other than malignant neoplasm: Secondary | ICD-10-CM

## 2022-05-23 MED ORDER — BUSPIRONE HCL 7.5 MG PO TABS: 7.5000 mg | ORAL_TABLET | Freq: Two times a day (BID) | ORAL | 0 refills | Status: DC

## 2022-05-23 NOTE — Progress Notes (Signed)
Established patient visit   Patient: Jeremiah Vance   DOB: 06/15/1970   52 y.o. Male  MRN: [id number] Visit Date: 05/23/2022  Today's healthcare provider: [name], PA-C  CC: ED discharge FU, left-sided weakness  Subjective    HPI  Patient is a 52 year old male who presents for follow up of emergency department visit.  He was seen on 05/18/22 and 05/20/22.  05/18/22 Chest pain-patient began having chest pain on this day.  He was given aspirin and NTG by EMS and pain improved.  EKG revealed no significant changes from prior EKG.  ER labs revealed normal Troponin.  Abnormal levels from other labs were: BASIC METABOLIC PANEL - Abnormal; Notable for the following components:      Result Value     Glucose, Bld 197 (*)      BUN 31 (*)      Creatinine, Ser 1.71 (*)      GFR, Estimated 48 (*)      All other components within normal limits  CBC - Abnormal; Notable for the following components:    RBC 4.12 (*)      Hemoglobin 11.0 (*)      HCT 34.9 (*)      All other components within normal limits  BRAIN NATRIURETIC PEPTIDE - Abnormal; Notable for the following components:    B Natriuretic Peptide 174.3 (*)      All other components within normal limits  URINALYSIS, ROUTINE W REFLEX MICROSCOPIC  CBG MONITORING, ED  TROPONIN I (HIGH SENSITIVITY)  TROPONIN I (HIGH SENSITIVITY)   Discharge diagnosis was Chest pain and anxiety.  05/20/22 Left sided weakness-patient presented with left sided weakness of upper and lower extremities and slurred speech at 8 am that morning.  At 3:07 pm he was brought to the ER via EMS.  At that time he reported to ER staff that symptoms improved with time.  EKG revealed negative interpretation of A Fib with no evidence of acute ischemia.  Abnormal labs were  CBC - Abnormal; Notable for the following components:      Result Value     RBC 4.14 (*)      Hemoglobin 10.9 (*)      HCT 35.5 (*)      All other components within normal limits  COMPREHENSIVE  METABOLIC PANEL - Abnormal; Notable for the following components:    Glucose, Bld 164 (*)      BUN 30 (*)      Creatinine, Ser 1.63 (*)      Albumin 3.4 (*)      GFR, Estimated 50 (*)      All other components within normal limits  CBG MONITORING, ED - Abnormal; Notable for the following components:    Glucose-Capillary 145 (*)      All other components within normal limits  PROTIME-INR  APTT  DIFFERENTIAL  ETHANOL  CBG MONITORING, ED   CT Head Code Stroke revealed No evidence of acute intracranial abnormality   Discharge diagnosis was left sided weakness and Dysarthria.  No medication changes from either visit.   Normal BS at home and normal BP  Medications: Outpatient Medications Prior to Visit  Medication Sig   apixaban (ELIQUIS) 5 MG TABS tablet Take 1 tablet (5 mg total) by mouth 2 (two) times daily.   atorvastatin (LIPITOR) 40 MG tablet Take 1 tablet (40 mg total) by mouth daily at 6 PM.   carvedilol (COREG) 12.5 MG tablet Take 1 tablet (  12.5 mg total) by mouth 2 (two) times daily with a meal.   Continuous Blood Gluc Sensor (FREESTYLE LIBRE 2 SENSOR) MISC Place 1 sensor on the skin every 14 days. Use to check glucose continuously   Continuous Blood Gluc Sensor (FREESTYLE LIBRE 3 SENSOR) MISC Place 1 sensor on the skin every 14 days. Use to check glucose continuously   dapagliflozin propanediol (FARXIGA) 10 MG TABS tablet Take 1 tablet (10 mg) by mouth once daily   glimepiride (AMARYL) 4 MG tablet Take 1 tablet (4 mg total) by mouth daily with breakfast.   hydrALAZINE (APRESOLINE) 50 MG tablet Take 1 tablet (50 mg total) by mouth in the morning and at bedtime.   irbesartan (AVAPRO) 300 MG tablet Take 300 mg by mouth daily.   LANTUS 100 UNIT/ML injection SMARTSIG:15 Unit(s) SUB-Q Daily   linagliptin (TRADJENTA) 5 MG TABS tablet Take 1 tablet (5 mg total) by mouth daily.   torsemide (DEMADEX) 20 MG tablet Take 2 tablets (40 mg total) by mouth daily.   No  facility-administered medications prior to visit.    Review of Systems  All other systems reviewed and are negative.  Except see hpi    Objective    BP (!) 83/55 (BP Location: Right Arm, Patient Position: Sitting, Cuff Size: Normal)   Pulse 77   Temp 98.3 F (36.8 C) (Oral)   Wt 273 lb (123.8 kg)   SpO2 99%   BMI 39.17 kg/m  Vitals:   05/23/22 1446 05/23/22 1448  BP: (!) 82/54 (!) 83/55  Pulse: 77   Temp: 98.3 F (36.8 C)   TempSrc: Oral   SpO2: 99%   Weight: 273 lb (123.8 kg)      Physical Exam Vitals reviewed.  Constitutional:      General: He is not in acute distress.    Appearance: Normal appearance. He is not diaphoretic.  HENT:     Head: Normocephalic and atraumatic.  Eyes:     General: No scleral icterus.    Conjunctiva/sclera: Conjunctivae normal.  Cardiovascular:     Rate and Rhythm: Normal rate and regular rhythm.     Pulses: Normal pulses.     Heart sounds: Normal heart sounds. No murmur heard. Pulmonary:     Effort: Pulmonary effort is normal. No respiratory distress.     Breath sounds: Normal breath sounds. No wheezing or rhonchi.  Musculoskeletal:     Cervical back: Neck supple.     Right lower leg: No edema.     Left lower leg: No edema.  Lymphadenopathy:     Cervical: No cervical adenopathy.  Skin:    General: Skin is warm and dry.     Findings: No rash.  Neurological:     Mental Status: He is alert and oriented to person, place, and time. Mental status is at baseline.  Psychiatric:        Mood and Affect: Mood normal.        Behavior: Behavior normal.      Results for orders placed or performed in visit on 05/23/22  CBC with Differential/Platelet  Result Value Ref Range   WBC 9.4 3.4 - 10.8 x10E3/uL   RBC 4.27 4.14 - 5.80 x10E6/uL   Hemoglobin 11.6 (L) 13.0 - 17.7 g/dL   Hematocrit 35.8 (L) 37.5 - 51.0 %   MCV 84 79 - 97 fL   MCH 27.2 26.6 - 33.0 pg   MCHC 32.4 31.5 - 35.7 g/dL   RDW 15.1 11.6 - 15.4 %  Platelets 421 150 -  450 x10E3/uL   Neutrophils 61 Not Estab. %   Lymphs 27 Not Estab. %   Monocytes 8 Not Estab. %   Eos 3 Not Estab. %   Basos 1 Not Estab. %   Neutrophils Absolute 5.8 1.4 - 7.0 x10E3/uL   Lymphocytes Absolute 2.6 0.7 - 3.1 x10E3/uL   Monocytes Absolute 0.7 0.1 - 0.9 x10E3/uL   EOS (ABSOLUTE) 0.2 0.0 - 0.4 x10E3/uL   Basophils Absolute 0.1 0.0 - 0.2 x10E3/uL   Immature Granulocytes 0 Not Estab. %   Immature Grans (Abs) 0.0 0.0 - 0.1 x10E3/uL  Comprehensive metabolic panel  Result Value Ref Range   Glucose 134 (H) 70 - 99 mg/dL   BUN 29 (H) 6 - 24 mg/dL   Creatinine, Ser 1.83 (H) 0.76 - 1.27 mg/dL   eGFR 44 (L) >59 mL/min/1.73   BUN/Creatinine Ratio 16 9 - 20   Sodium 139 134 - 144 mmol/L   Potassium 4.6 3.5 - 5.2 mmol/L   Chloride 100 96 - 106 mmol/L   CO2 23 20 - 29 mmol/L   Calcium 9.7 8.7 - 10.2 mg/dL   Total Protein 6.9 6.0 - 8.5 g/dL   Albumin 3.9 3.8 - 4.9 g/dL   Globulin, Total 3.0 1.5 - 4.5 g/dL   Albumin/Globulin Ratio 1.3 1.2 - 2.2   Bilirubin Total 0.6 0.0 - 1.2 mg/dL   Alkaline Phosphatase 86 44 - 121 IU/L   AST 17 0 - 40 IU/L   ALT 16 0 - 44 IU/L    Assessment & Plan     Anxiety and depression Chronic and worsening Could be trigger by ED visti  2/2 his fear for an another stroke and grief for passed away family members Advised to start on anxiolytics    05/01/2022    1:13 PM 03/08/2020    9:33 AM 08/27/2019   10:38 AM 08/02/2019   12:39 PM  GAD 7 : Generalized Anxiety Score  Nervous, Anxious, on Edge 2 0 0 0  Control/stop worrying 2 0 0 0  Worry too much - different things 2 0 0 0  Trouble relaxing 0 0 0 0  Restless 0 0 0 0  Easily annoyed or irritable 3 0 0 0  Afraid - awful might happen 1 0 0 0  Total GAD 7 Score 10 0 0 0  Anxiety Difficulty Not difficult at all Not difficult at all Not difficult at all Not difficult at all       05/01/2022    1:11 PM 04/23/2022   11:07 AM 11/28/2021    2:39 PM  PHQ9 SCORE ONLY  PHQ-9 Total Score '6 5 13   '$ Today  PHQ9 increased to 11 and GAD7 to 12. - CBC with Differential/Platelet - Comprehensive metabolic panel - Ambulatory referral to Psychology - busPIRone (BUSPAR) 7.5 MG tablet; Take 1 tablet (7.5 mg total) by mouth 2 (two) times daily.  Dispense: 30 tablet; Refill: 0 Will FU  Hypotension due to drugs Currently, pat is on triple therapy Temporarily, he needs to w/h torsemide '20mg'$  daily, and continue coreg 12/5 mg BID and  Pt was advised to continue measuring his BP at home and watch if other changes need to be made. Contact us on Monday if needed - CBC with Differential/Platelet - Comprehensive metabolic panel - Ambulatory referral to Psychology Will reassess after receiving lab results.  Hospital discharge follow-up Leg-sided weakness Improving Currently, in left upper extremity only, no problems with speech or  left lower extremity. Stable neuro exam Pt was advised to start PT/OT. Referrals was placed. Will reassess upon completion Pt was ordered CBC, CMP Will reassess after receiving lab results Will FU  Work note was provided for pt.  The patient was advised to call back or seek an in-person evaluation if the symptoms worsen or if the condition fails to improve as anticipated.      I discussed the assessment and treatment plan with the patient. The patient was provided an opportunity to ask questions and all were answered. The patient agreed with the plan and demonstrated an understanding of the instructions.  I, [name], PA-C have reviewed all documentation for this visit. The documentation on 05/23/22 for the exam, diagnosis, procedures, and orders are all accurate and complete.  [name], [facility], [place] [phone] (phone) [phone] (fax)   [place]

## 2022-05-24 LAB — CBC WITH DIFFERENTIAL/PLATELET
Basophils Absolute: 0.1 10*3/uL (ref 0.0–0.2)
Basos: 1 %
EOS (ABSOLUTE): 0.2 10*3/uL (ref 0.0–0.4)
Eos: 3 %
Hematocrit: 35.8 % — ABNORMAL LOW (ref 37.5–51.0)
Hemoglobin: 11.6 g/dL — ABNORMAL LOW (ref 13.0–17.7)
Immature Grans (Abs): 0 10*3/uL (ref 0.0–0.1)
Immature Granulocytes: 0 %
Lymphocytes Absolute: 2.6 10*3/uL (ref 0.7–3.1)
Lymphs: 27 %
MCH: 27.2 pg (ref 26.6–33.0)
MCHC: 32.4 g/dL (ref 31.5–35.7)
MCV: 84 fL (ref 79–97)
Monocytes Absolute: 0.7 10*3/uL (ref 0.1–0.9)
Monocytes: 8 %
Neutrophils Absolute: 5.8 10*3/uL (ref 1.4–7.0)
Neutrophils: 61 %
Platelets: 421 10*3/uL (ref 150–450)
RBC: 4.27 x10E6/uL (ref 4.14–5.80)
RDW: 15.1 % (ref 11.6–15.4)
WBC: 9.4 10*3/uL (ref 3.4–10.8)

## 2022-05-24 LAB — COMPREHENSIVE METABOLIC PANEL
ALT: 16 IU/L (ref 0–44)
AST: 17 IU/L (ref 0–40)
Albumin/Globulin Ratio: 1.3 (ref 1.2–2.2)
Albumin: 3.9 g/dL (ref 3.8–4.9)
Alkaline Phosphatase: 86 IU/L (ref 44–121)
BUN/Creatinine Ratio: 16 (ref 9–20)
BUN: 29 mg/dL — ABNORMAL HIGH (ref 6–24)
Bilirubin Total: 0.6 mg/dL (ref 0.0–1.2)
CO2: 23 mmol/L (ref 20–29)
Calcium: 9.7 mg/dL (ref 8.7–10.2)
Chloride: 100 mmol/L (ref 96–106)
Creatinine, Ser: 1.83 mg/dL — ABNORMAL HIGH (ref 0.76–1.27)
Globulin, Total: 3 g/dL (ref 1.5–4.5)
Glucose: 134 mg/dL — ABNORMAL HIGH (ref 70–99)
Potassium: 4.6 mmol/L (ref 3.5–5.2)
Sodium: 139 mmol/L (ref 134–144)
Total Protein: 6.9 g/dL (ref 6.0–8.5)
eGFR: 44 mL/min/{1.73_m2} — ABNORMAL LOW (ref >59)

## 2022-05-24 NOTE — Progress Notes (Signed)
Please,let pt know that his CBC/hemoglobin level and BS are improving but his kidney function decreased. Advised to drink an adequate amount of fluids. We might recheck it at your next appt if needed.

## 2022-05-25 ENCOUNTER — Encounter: Payer: Self-pay | Admitting: Physician Assistant

## 2022-05-27 ENCOUNTER — Encounter: Payer: Self-pay | Admitting: Neurology

## 2022-05-29 NOTE — Therapy (Signed)
OUTPATIENT PHYSICAL THERAPY NEURO EVALUATION   Patient Name: Jeremiah Vance MRN: [id number] DOB:1970/04/14, 52 y.o., male Today's Date: 05/30/2022   PCP: [name] PA REFERRING PROVIDER: [name] PA  END OF SESSION:  PT End of Session - 05/30/22 1052     Visit Number 1    Number of Visits 24    Date for PT Re-Evaluation 08/22/22    PT Start Time 0930    PT Stop Time [id number]    PT Time Calculation (min) 45 min    Equipment Utilized During Treatment Gait belt    Activity Tolerance Patient tolerated treatment well;Patient limited by pain    Behavior During Therapy [facility] for tasks assessed/performed             Past Medical History:  Diagnosis Date   Acute ischemic stroke ([place]) 09/06/2017   Acute renal failure superimposed on stage 3a chronic kidney disease ([place]) 07/08/2019   Acute right-sided weakness    Allergies    Anasarca    Anemia 07/10/2017   Arthritis    CHF (congestive heart failure) ([place])    "coinsided w/kidney problems I was having 06/2017"   Chicken pox    CKD (chronic kidney disease) stage 3, GFR 30-59 ml/min ([place]) 06/2017   Depression    Elevated troponin 07/08/2019   High cholesterol    History of cardiomyopathy    LVEF 40 to 45% in April 2019 - subsequently normalized   Hyperbilirubinemia 07/10/2017   Hypertension    Ischemic stroke ([place])    Small left internal capsule infarct due to lacunar disease   Morbid obesity ([place])    Normocytic anemia 12/16/2017   Recurrent incisional hernia with incarceration s/p repair 10/22/2017 10/21/2017   Stroke ([place]) 08/2017   "right sided weakness since; getting stronger though" (10/22/2017)   Type 2 diabetes mellitus ([place])    Past Surgical History:  Procedure Laterality Date   ABDOMINAL HERNIA REPAIR  2008; 10/22/2017   "scope; OPEN REPAIR INCARCERATED VENTRAL HERNIA   HERNIA REPAIR     KNEE ARTHROSCOPY Right 1989   VENTRAL HERNIA REPAIR N/A 10/22/2017   Procedure: OPEN REPAIR INCARCERATED VENTRAL HERNIA;   Surgeon: Excell Seltzer, MD;  Location: [place];  Service: General;  Laterality: N/A;   Patient Active Problem List   Diagnosis Date Noted   Dysarthria 05/20/2022   Colon cancer screening 03/20/2022   Need for diphtheria-tetanus-pertussis (Tdap) vaccine 03/20/2022   Acute CVA (cerebrovascular accident) ([place]) 01/16/2022   Type 2 diabetes mellitus without complication, with long-term current use of insulin ([place]) 01/16/2022   Dyslipidemia 01/16/2022   Chronic diastolic CHF (congestive heart failure) ([place]) 01/16/2022   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    Intracranial hemorrhage (HCC)    HFrEF (heart failure with reduced ejection fraction) ([place])    Hypertensive emergency 11/22/2021   Obesity, Class III, BMI 40-49.9 (morbid obesity) ([place]) 05/22/2020   Cough    Other cirrhosis of liver ([place])    Ventral hernia without obstruction or gangrene    Acute CHF (congestive heart failure) ([place]) 02/20/2020   Shortness of breath    Hypertensive urgency    Neck pain    Prostate cancer screening 01/21/2020   Lymphedema of both lower extremities 10/17/2019   History of stroke 10/17/2019   CKD (chronic kidney disease) stage 3, GFR 30-59 ml/min (HCC) 08/31/2019   Swelling of limb 08/31/2019   DM (diabetes mellitus), type 2 with complications ([place]) [id number]   Acute kidney injury superimposed  on CKD ([place]) 07/08/2019   Chest pain 07/08/2019   Vitreous floaters of both eyes 06/03/2018   Moderate obstructive sleep apnea 02/11/2018   Chronic fatigue 12/16/2017   Essential hypertension 12/16/2017   History of small bowel obstruction 10/24/2017   Adjustment disorder with depressed mood 09/30/2017   Stroke ([place] [address]) 09/14/2017   Acute on chronic heart failure with preserved ejection fraction (HFpEF) ([place])    Hyperlipidemia    Gout    Elevated LFTs    Heart failure with preserved ejection fraction ([place])    Morbid obesity ([place])    Hypoalbuminemia 07/11/2017   Type  2 diabetes mellitus with hyperglycemia ([place]) 07/10/2017    ONSET DATE: 05/20/22  REFERRING DIAG: Left sided weakness   THERAPY DIAG:  Muscle weakness (generalized)  Unsteadiness on feet  Difficulty in walking, not elsewhere classified  Rationale for Evaluation and Treatment: Rehabilitation  SUBJECTIVE:                                                                                                                                                                                             SUBJECTIVE STATEMENT: Patient presents to physical therapy for L sided weakness.  Pt accompanied by: self  PERTINENT HISTORY:   Patient presents for Left sided weakness. Patient presented to [facility] on 05/20/22 for L sided weakness and slurred speech.MRI negative.  PMH includes CVA (2019, 2023), obesity, Type II DM, HTN, HLD, lymphedema of BLE, Stage IIIa CKD, anemia, depression, hernia,arthritis, CHF. Patient had multiple strokes in  fall 2023 and received home health. 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 Patient has not worked the past two weeks due to another incident 2-3 weeks ago. Prior to stroke was independent, use a walker occasionally now.   PAIN:  Are you having pain? Yes: NPRS scale: 6/10 Pain location: bilateral knee pain: R>L Pain description: aching Aggravating factors: cold, prolonged standing Relieving factors: sitting down, keeping legs raised, hot shower  PRECAUTIONS: Fall  WEIGHT BEARING RESTRICTIONS: No  FALLS: Has patient fallen in last 6 months? Yes. Number of falls 1  LIVING ENVIRONMENT: Lives with: lives with their family Lives in: House/apartment Stairs: Yes: Internal: flight steps; on right going up Has following equipment at home: Gilford Rile - 2 wheeled  PLOF: Independent  PATIENT GOALS: to get stronger  OBJECTIVE:   DIAGNOSTIC FINDINGS:   MRI: IMPRESSION: 1. No evidence of acute abnormality. 2. Prior hemorrhages, prior lacunar infarcts, and prior  chronic microvascular ischemic disease which is advanced for patient age. Findings could be in part secondary to chronic hypertension.  CTA:   1. No emergent large vessel occlusion.  2. 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 3. Multifocal irregularity and moderate narrowing of the right vertebral artery, mildly progressed.  COGNITION: Overall cognitive status:  short term memory issues s/p stroke.    SENSATION: Light touch: Impaired LLE  COORDINATION: LLE coordination impaired with delay and limited coordination.   EDEMA:  Bilateral lymphedema of LE's   POSTURE: rounded shoulders, forward head, and flexed trunk    LOWER EXTREMITY MMT:    MMT Right Eval Left Eval  Hip flexion 3 3  Hip extension    Hip abduction 4- 3+  Hip adduction 4- 3+  Hip internal rotation    Hip external rotation    Knee flexion 3+ 3  Knee extension 3+ 3  Ankle dorsiflexion 4- 3-  Ankle plantarflexion 4- 3+  Ankle inversion    Ankle eversion    (Blank rows = not tested)   TRANSFERS: Assistive device utilized: None  Sit to stand: CGA Stand to sit: CGA Chair to chair: CGA    GAIT: Gait pattern: step through pattern, decreased stride length, antalgic, wide BOS, poor foot clearance- Right, and poor foot clearance- Left Distance walked: 40 ft Assistive device utilized: None Level of assistance: CGA Comments: Patient is unsteady without AD, has antalgic gait pattern with R knee however has limited strength for weightbearing on LLE.   FUNCTIONAL TESTS:  5 times sit to stand: 41 seconds  10 meter walk test: 21 seconds increased knee pain 6/10 Berg Balance Scale: 14/56  PATIENT SURVEYS:  FOTO 44  TODAY'S TREATMENT:                                                                                                                              DATE: 05/30/22  Eval and HEP     PATIENT EDUCATION: Education details: goals, POC, HEP Person educated: Patient Education  method: Explanation, Demonstration, Corporate treasurer cues, and Verbal cues Education comprehension: verbalized understanding, returned demonstration, verbal cues required, and tactile cues required  HOME EXERCISE PROGRAM: Access Code: [email or web address] URL: [email or web address] Date: 05/30/2022 Prepared by: Janna Arch  Exercises - Leg Extension  - 1 x daily - 7 x weekly - 2 sets - 10 reps - 5 hold - Seated March  - 1 x daily - 7 x weekly - 2 sets - 10 reps - 5 hold - Seated Ankle Circles  - 1 x daily - 7 x weekly - 2 sets - 10 reps - 5 hold  GOALS: Goals reviewed with patient? Yes  SHORT TERM GOALS: Target date: 06/27/2022    Patient will be independent in home exercise program to improve strength/mobility for better functional independence with ADLs. Baseline: 2/29: HEP given Goal status: INITIAL   LONG TERM GOALS: Target date: 08/22/2022    Patient will increase FOTO score to equal to or greater than  54%   to demonstrate statistically significant improvement in mobility and quality of life.  Baseline: 2/29: 44%  Goal status: INITIAL  2.  Patient (< 59 years old) will complete five times sit to stand test in < 10 seconds indicating an increased LE strength and improved balance. Baseline: 2/29: 41 seconds with significant UE support Goal status: INITIAL  3.  Patient will increase Berg Balance score by > 10 points (24/56) to demonstrate decreased fall risk during functional activities. Baseline: 2/29: 14/56 Goal status: INITIAL  4.  Patient will increase 10 meter walk test to >1.40ms as to improve gait speed for better community ambulation and to reduce fall risk. Baseline: 2/29: 21 seconds increased knee pain Goal status: INITIAL    ASSESSMENT:  CLINICAL IMPRESSION: Patient is a 52y.o. male who was seen today for physical therapy evaluation and treatment for left sided weakness. Patient has a history of multiple strokes, first in 2019 and last in fall of 2023.  Patient had additional incident in Feb with weakness of L side. Patient has severe bilateral knee pain that limits his mobility at baseline. Patient does have a walker but primarily "furniture" surfs at home. He does want to increase his strength and ability to walk without an AD. Patient has significant balance deficits as can be seen in BERG score. His ability to transfer is limited by weakness and knee pain. Patient educated on HEP and demonstrated understanding. Patient does have significant visual field loss. Patient will benefit from skilled physical therapy to improve strength, stability, and mobility for improved quality of life.   OBJECTIVE IMPAIRMENTS: Abnormal gait, cardiopulmonary status limiting activity, decreased activity tolerance, decreased balance, decreased coordination, decreased endurance, decreased mobility, difficulty walking, decreased strength, increased edema, impaired perceived functional ability, impaired flexibility, impaired sensation, impaired UE functional use, impaired vision/preception, improper body mechanics, postural dysfunction, obesity, and pain.   ACTIVITY LIMITATIONS: carrying, lifting, bending, sitting, standing, squatting, stairs, transfers, bed mobility, bathing, toileting, dressing, reach over head, hygiene/grooming, locomotion level, and caring for others  PARTICIPATION LIMITATIONS: meal prep, cleaning, laundry, personal finances, driving, shopping, community activity, occupation, yard work, school, and church  PERSONAL FACTORS: Age, Behavior pattern, Education, FRestaurant manager, fast food Past/current experiences, Profession, Social background, Time since onset of injury/illness/exacerbation, Transportation, and 3+ comorbidities: CVA (2019, 2023), obesity, Type II DM, HTN, HLD, lymphedema of BLE, Stage IIIa CKD, anemia, depression, hernia,arthritis, CHF  are also affecting patient's functional outcome.   REHAB POTENTIAL: Good  CLINICAL DECISION MAKING: Evolving/moderate  complexity  EVALUATION COMPLEXITY: Moderate  PLAN:  PT FREQUENCY: 2x/week  PT DURATION: 12 weeks  PLANNED INTERVENTIONS: Therapeutic exercises, Therapeutic activity, Neuromuscular re-education, Balance training, Gait training, Patient/Family education, Self Care, Joint mobilization, Joint manipulation, Stair training, Vestibular training, Canalith repositioning, Visual/preceptual remediation/compensation, Orthotic/Fit training, DME instructions, Cognitive remediation, Electrical stimulation, Spinal mobilization, Cryotherapy, Manual lymph drainage, Compression bandaging, Taping, Vasopneumatic device, Traction, Ultrasound, Ionotophoresis '4mg'$ /ml Dexamethasone, Manual therapy, and Re-evaluation  PLAN FOR NEXT SESSION: balance, strength   MJanna Arch PT 05/30/2022, 10:52 AM

## 2022-05-30 ENCOUNTER — Ambulatory Visit: Payer: BC Managed Care – PPO

## 2022-05-30 ENCOUNTER — Ambulatory Visit: Payer: BC Managed Care – PPO | Attending: Physician Assistant

## 2022-05-30 DIAGNOSIS — R531 Weakness: Secondary | ICD-10-CM | POA: Insufficient documentation

## 2022-05-30 DIAGNOSIS — R278 Other lack of coordination: Secondary | ICD-10-CM | POA: Insufficient documentation

## 2022-05-30 DIAGNOSIS — M6281 Muscle weakness (generalized): Secondary | ICD-10-CM | POA: Insufficient documentation

## 2022-05-30 DIAGNOSIS — R2681 Unsteadiness on feet: Secondary | ICD-10-CM

## 2022-05-30 DIAGNOSIS — R262 Difficulty in walking, not elsewhere classified: Secondary | ICD-10-CM | POA: Insufficient documentation

## 2022-05-30 NOTE — Therapy (Addendum)
OUTPATIENT OCCUPATIONAL THERAPY NEURO EVALUATION  Patient Name: Jeremiah Vance MRN: [id number] DOB:Dec 29, 1970, 52 y.o., male Today's Date: 05/30/2022  PCP: [name], PA-C REFERRING PROVIDER: [name], PA-C  END OF SESSION:  OT End of Session - 05/30/22 2130     Visit Number 1    Number of Visits 24    Date for OT Re-Evaluation 08/22/22    Progress Note Due on Visit 10    OT Start Time 0830    OT Stop Time 0925    OT Time Calculation (min) 55 min    Equipment Utilized During Treatment transport chair    Activity Tolerance Patient tolerated treatment well    Behavior During Therapy [facility] for tasks assessed/performed             Past Medical History:  Diagnosis Date   Acute ischemic stroke ([place]) 09/06/2017   Acute renal failure superimposed on stage 3a chronic kidney disease ([place]) 07/08/2019   Acute right-sided weakness    Allergies    Anasarca    Anemia 07/10/2017   Arthritis    CHF (congestive heart failure) ([place])    "coinsided w/kidney problems I was having 06/2017"   Chicken pox    CKD (chronic kidney disease) stage 3, GFR 30-59 ml/min ([place]) 06/2017   Depression    Elevated troponin 07/08/2019   High cholesterol    History of cardiomyopathy    LVEF 40 to 45% in April 2019 - subsequently normalized   Hyperbilirubinemia 07/10/2017   Hypertension    Ischemic stroke ([place])    Small left internal capsule infarct due to lacunar disease   Morbid obesity ([place])    Normocytic anemia 12/16/2017   Recurrent incisional hernia with incarceration s/p repair 10/22/2017 10/21/2017   Stroke ([place]) 08/2017   "right sided weakness since; getting stronger though" (10/22/2017)   Type 2 diabetes mellitus ([place])    Past Surgical History:  Procedure Laterality Date   ABDOMINAL HERNIA REPAIR  2008; 10/22/2017   "scope; OPEN REPAIR INCARCERATED VENTRAL HERNIA   HERNIA REPAIR     KNEE ARTHROSCOPY Right 1989   VENTRAL HERNIA REPAIR N/A 10/22/2017   Procedure: OPEN REPAIR INCARCERATED  VENTRAL HERNIA;  Surgeon: Excell Seltzer, MD;  Location: [facility];  Service: General;  Laterality: N/A;   Patient Active Problem List   Diagnosis Date Noted   Dysarthria 05/20/2022   Colon cancer screening 03/20/2022   Need for diphtheria-tetanus-pertussis (Tdap) vaccine 03/20/2022   Acute CVA (cerebrovascular accident) ([place]) 01/16/2022   Type 2 diabetes mellitus without complication, with long-term current use of insulin ([place]) 01/16/2022   Dyslipidemia 01/16/2022   Chronic diastolic CHF (congestive heart failure) ([place]) 01/16/2022   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    Intracranial hemorrhage (HCC)    HFrEF (heart failure with reduced ejection fraction) ([place])    Hypertensive emergency 11/22/2021   Obesity, Class III, BMI 40-49.9 (morbid obesity) ([place]) 05/22/2020   Cough    Other cirrhosis of liver ([place])    Ventral hernia without obstruction or gangrene    Acute CHF (congestive heart failure) ([place]) 02/20/2020   Shortness of breath    Hypertensive urgency    Neck pain    Prostate cancer screening 01/21/2020   Lymphedema of both lower extremities 10/17/2019   History of stroke 10/17/2019   CKD (chronic kidney disease) stage 3, GFR 30-59 ml/min ([place]) 08/31/2019   Swelling of limb 08/31/2019   DM (diabetes mellitus), type 2 with complications ([place]) [id number]  Acute kidney injury superimposed on CKD ([place]) 07/08/2019   Chest pain 07/08/2019   Vitreous floaters of both eyes 06/03/2018   Moderate obstructive sleep apnea 02/11/2018   Chronic fatigue 12/16/2017   Essential hypertension 12/16/2017   History of small bowel obstruction 10/24/2017   Adjustment disorder with depressed mood 09/30/2017   Stroke ([place]) 09/14/2017   Acute on chronic heart failure with preserved ejection fraction (HFpEF) (HCC)    Hyperlipidemia    Gout    Elevated LFTs    Heart failure with preserved ejection fraction ([place])    Morbid obesity ([place])    Hypoalbuminemia  07/11/2017   Type 2 diabetes mellitus with hyperglycemia ([place]) 07/10/2017    ONSET DATE: Aug 2023  REFERRING DIAG: L sided weakness  THERAPY DIAG:  Muscle weakness (generalized)  Other lack of coordination  Rationale for Evaluation and Treatment: Rehabilitation  SUBJECTIVE: Pt endorses residual weakness in the L arm from CVA in Aug of 2023.  SUBJECTIVE STATEMENT:   PERTINENT HISTORY: Pt reports 2-3 CVAs in Aug of 2023, but a total of 5 CVAs over the course of 4 years.  After the strokes in Aug, pt participated in [facility] for 2-3 months.  Pt reports that he had been able to return to work up until 05/20/22.  Pt reported that his work felt like he was exhibiting stroke symptoms, so he went to the ED.  Per chart from ED visit on 05/20/22:     Clinical Course as of 05/20/22 1559  Mon May 20, 2022  1505 Prior CVA with soft blood pressures - reactivation of old CVA.  CTA negative. Code stroke and if MRI negative then will dc home.  [SM]     Clinical Course User Index [SM] Jeremiah Man, MD  MRI negative.  Patient states that he is feeling better.  Blood pressure improved while in the emergency department.  Last blood pressure [id number] systolic.  Patient discharged home in stable condition with discussion to follow-up as an outpatient and return precautions given.   Jeremiah Man, MD 05/20/22 1733     PRECAUTIONS: fall  WEIGHT BEARING RESTRICTIONS: No  PAIN:  Are you having pain? Yes: NPRS scale: 7/10 Pain location: Bilat knees, R worse than L, occasional pain in the R shoulder (arthritic) Pain description: achy Aggravating factors: cold weather, walking, stairs Relieving factors: rest, elevation  FALLS: Has patient fallen in last 6 months? Yes. 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  PATIENT GOALS: To get the L side stronger and return to work.   OBJECTIVE:   HAND DOMINANCE: Right  ADLs: Overall ADLs: modified indep with primarily use of the R arm. Transfers/ambulation related to ADLs: uses the walker outside, indoors no walker Eating: sister cuts food  Grooming: indep UB Dressing: extra time to manage clothing fasteners LB Dressing: slip on shoes; tucks laces into shoes Toileting: indep Bathing: modified indep Tub Shower transfers: walk in shower Equipment: see above  IADLs: Shopping: pt can accompany sister and pt pushes the cart Light housekeeping: shared  task between pt and sister  Meal Prep: some difficulty opening new packages/containers but can manage simple light meal prep/microwavable meals  Community mobility: able to drive, limited ambulator d/t obesity, arthritic knees, and L sided weakness Medication management: sister sets up pills into weekly pill organizer  Financial management: pt manages  Handwriting: No issues (pt is R hand dominant)  POSTURE COMMENTS:  rounded shoulders  ACTIVITY TOLERANCE: Activity tolerance: fair; limited community ambulator  FUNCTIONAL OUTCOME MEASURES: FOTO: 52; predicted 55  UPPER EXTREMITY ROM:    Active ROM Right Eval WNL Left eval  Shoulder flexion  98 (limited by pain)  Shoulder abduction  102  Shoulder adduction    Shoulder extension    Shoulder internal rotation    Shoulder external rotation    Elbow flexion    Elbow extension    Wrist flexion    Wrist extension    Wrist ulnar deviation    Wrist radial deviation    Wrist pronation    Wrist supination    (Blank rows = not tested)  UPPER EXTREMITY MMT:     MMT Right eval  Left eval  Shoulder flexion 4+ NT (pain)  Shoulder abduction 4 NT (pain)  Shoulder adduction    Shoulder extension    Shoulder internal rotation    Shoulder external rotation    Middle trapezius    Lower trapezius    Elbow flexion 5 4-  Elbow extension 5 4-  Wrist flexion 5 3+  Wrist extension 5 3+  Wrist ulnar deviation    Wrist radial deviation    Wrist pronation    Wrist supination    (Blank rows = not tested)  HAND FUNCTION: Grip strength: Right: 71 lbs; Left: 27 lbs, Lateral pinch: Right: 16 lbs, Left: 6 lbs, and 3 point pinch: Right: 18 lbs, Left: 8 lbs  COORDINATION: Finger Nose Finger test: L sided good accuracy but slower  9 Hole Peg test: Right: 38 sec; Left: 46 sec Moderate difficulty opposing L hand digits to thumb   SENSATION: Light touch: Impaired , pt reports some L hand numbness ulnar nerve distribution volar and dorsal aspect, does not go up the arm  EDEMA: none  MUSCLE TONE: normal  COGNITION: Overall cognitive status: WFL for tasks assessed  VISION: Subjective report: 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   Therapeutic Exercise: Issued medium soft (turquoise) theraputty and instructed pt in strengthening and coordination exercises for L hand, including gross grasping, lateral/2 point/3 point pinching, digit abd/add, and digging  coins out of putty.  Able to return demo with intermittent vc for technique to improve quality of movement.  Encouraged completion 5-10 min, 1-2x per day.    PATIENT EDUCATION: Education details: OT role, goals, poc, theraputty exercises Person educated: Patient Education method: Explanation and Verbal cues Education comprehension: verbalized understanding  HOME EXERCISE PROGRAM: Theraputty   GOALS: Goals reviewed with patient? Yes  SHORT TERM GOALS: Target date: 07/11/22 (6 weeks)  Pt will be indep to perform HEP for increasing strength and coordination throughout the LUE. Baseline: Eval: issued putty exercises Goal status: INITIAL   LONG TERM GOALS: Target date: 08/22/22 (12 weeks)  Pt will increase FOTO score to 55 or better to indicate clinically relevant improvement in self perceived use of L arm to perform daily tasks.  Baseline: Eval: 52 Goal status: INITIAL  2.  Pt will increase L grip strength by 10 or more lbs in order to hold and carry ADL supplies in L hand. Baseline: Eval: L grip 27 lbs (R 71 lbs) Goal status: INITIAL  3.  Pt will increase LUE strength at the elbow and wrist in order to carry a heavy laundry basket with BUEs. Baseline: L elbow flex 4-, wrist flex 3+ Goal status: INITIAL  4.  Pt will increase L lateral pinch strength in order to ease ability to open small bottles and food packages.  Baseline: L 6 lbs (R 16 lbs) Goal status: INITIAL  5.  Pt will increase L FMC/dexterity skills to ease ability to manipulate small ADL supplies.  Baseline: L 9 hole 48 sec (R 36 sec); extra time to manage clothing fasteners and to pick up pills Goal status: INITIAL   ASSESSMENT:  CLINICAL IMPRESSION: Patient is a 51 y.o. male who was seen today for occupational therapy evaluation for L sided weakness post CVA.  Pt presents with L shoulder pain related to arthritis (pain in bilat shoulders), and weakness throughout the LUE which limits use of LUE with manipulation  of ADL supplies.  Pt reports he mostly uses the R hand to perform daily tasks.  Pt resides with his sister in a town home.  Pt was working full time as a Tourist information centre manager at [employer] up until a recent ED visit where his work thought he was exhibiting signs of another stroke.  Pt was negative for a new stroke on 05/20/21 and was cleared to return home, but pt does report ongoing weakness throughout the L side since his stroke in Aug of 2023.  Pt's goal is to strengthen the LUE and return to work.  PERFORMANCE DEFICITS: in functional skills including ADLs, IADLs, coordination, dexterity, sensation, strength, pain, Fine motor control, Gross motor control, mobility, balance, body mechanics, endurance, decreased knowledge of use of DME, vision, and UE functional use, and psychosocial skills including coping strategies, environmental adaptation, habits, and routines and behaviors.   IMPAIRMENTS: are limiting patient from ADLs and IADLs.   CO-MORBIDITIES: has co-morbidities such as legally blind L eye, obesity, arthritis  that affects occupational performance. Patient will benefit from skilled OT to address above impairments and improve overall function.  MODIFICATION OR ASSISTANCE TO COMPLETE EVALUATION: No modification of tasks or assist necessary to complete an evaluation.  OT OCCUPATIONAL PROFILE AND HISTORY: Problem focused assessment: Including review of records relating to presenting  problem.  CLINICAL DECISION MAKING: Moderate - several treatment options, min-mod task modification necessary  REHAB POTENTIAL: Good  EVALUATION COMPLEXITY: Moderate    PLAN:  OT FREQUENCY: 2x/week  OT DURATION: 12 weeks  PLANNED INTERVENTIONS: self care/ADL training, therapeutic exercise, therapeutic activity, neuromuscular re-education, manual therapy, passive range of motion, balance training, functional mobility training, moist heat, cryotherapy, contrast bath, patient/family education, energy conservation, coping  strategies training, and DME and/or AE instructions  RECOMMENDED OTHER SERVICES: None at this time  CONSULTED AND AGREED WITH PLAN OF CARE: Patient  PLAN FOR NEXT SESSION: see above  Leta Speller, MS, OTR/L   Darleene Cleaver, OT 05/30/2022, 9:33 PM

## 2022-05-30 NOTE — Therapy (Signed)
OUTPATIENT PHYSICAL THERAPY NEURO TREATMENT   Patient Name: Jeremiah Vance MRN: [id number] DOB:08/25/1970, 52 y.o., male Today's Date: 06/03/2022   PCP: [name] PA REFERRING PROVIDER: [name] PA  END OF SESSION:  PT End of Session - 06/03/22 1513     Visit Number 2    Number of Visits 24    Date for PT Re-Evaluation 08/22/22    PT Start Time 1514    PT Stop Time 1559    PT Time Calculation (min) 45 min    Equipment Utilized During Treatment Gait belt    Activity Tolerance Patient tolerated treatment well;Patient limited by pain    Behavior During Therapy [facility] for tasks assessed/performed              Past Medical History:  Diagnosis Date   Acute ischemic stroke ([place]) 09/06/2017   Acute renal failure superimposed on stage 3a chronic kidney disease ([place]) 07/08/2019   Acute right-sided weakness    Allergies    Anasarca    Anemia 07/10/2017   Arthritis    CHF (congestive heart failure) ([place])    "coinsided w/kidney problems I was having 06/2017"   Chicken pox    CKD (chronic kidney disease) stage 3, GFR 30-59 ml/min ([place]) 06/2017   Depression    Elevated troponin 07/08/2019   High cholesterol    History of cardiomyopathy    LVEF 40 to 45% in April 2019 - subsequently normalized   Hyperbilirubinemia 07/10/2017   Hypertension    Ischemic stroke ([place])    Small left internal capsule infarct due to lacunar disease   Morbid obesity ([place])    Normocytic anemia 12/16/2017   Recurrent incisional hernia with incarceration s/p repair 10/22/2017 10/21/2017   Stroke ([place]) 08/2017   "right sided weakness since; getting stronger though" (10/22/2017)   Type 2 diabetes mellitus ([place])    Past Surgical History:  Procedure Laterality Date   ABDOMINAL HERNIA REPAIR  2008; 10/22/2017   "scope; OPEN REPAIR INCARCERATED VENTRAL HERNIA   HERNIA REPAIR     KNEE ARTHROSCOPY Right 1989   VENTRAL HERNIA REPAIR N/A 10/22/2017   Procedure: OPEN REPAIR INCARCERATED VENTRAL HERNIA;   Surgeon: Excell Seltzer, MD;  Location: [facility];  Service: General;  Laterality: N/A;   Patient Active Problem List   Diagnosis Date Noted   Dysarthria 05/20/2022   Colon cancer screening 03/20/2022   Need for diphtheria-tetanus-pertussis (Tdap) vaccine 03/20/2022   Acute CVA (cerebrovascular accident) ([place]) 01/16/2022   Type 2 diabetes mellitus without complication, with long-term current use of insulin ([place]) 01/16/2022   Dyslipidemia 01/16/2022   Chronic diastolic CHF (congestive heart failure) ([place]) 01/16/2022   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    Intracranial hemorrhage (HCC)    HFrEF (heart failure with reduced ejection fraction) ([place])    Hypertensive emergency 11/22/2021   Obesity, Class III, BMI 40-49.9 (morbid obesity) ([place]) 05/22/2020   Cough    Other cirrhosis of liver ([place])    Ventral hernia without obstruction or gangrene    Acute CHF (congestive heart failure) ([place]) 02/20/2020   Shortness of breath    Hypertensive urgency    Neck pain    Prostate cancer screening 01/21/2020   Lymphedema of both lower extremities 10/17/2019   History of stroke 10/17/2019   CKD (chronic kidney disease) stage 3, GFR 30-59 ml/min (HCC) 08/31/2019   Swelling of limb 08/31/2019   DM (diabetes mellitus), type 2 with complications ([place]) [id number]   Acute kidney injury  superimposed on CKD ([place]) 07/08/2019   Chest pain 07/08/2019   Vitreous floaters of both eyes 06/03/2018   Moderate obstructive sleep apnea 02/11/2018   Chronic fatigue 12/16/2017   Essential hypertension 12/16/2017   History of small bowel obstruction 10/24/2017   Adjustment disorder with depressed mood 09/30/2017   Stroke ([place]) 09/14/2017   Acute on chronic heart failure with preserved ejection fraction (HFpEF) ([place])    Hyperlipidemia    Gout    Elevated LFTs    Heart failure with preserved ejection fraction ([place])    Morbid obesity ([place])    Hypoalbuminemia 07/11/2017   Type  2 diabetes mellitus with hyperglycemia ([place]) 07/10/2017    ONSET DATE: 05/20/22  REFERRING DIAG: Left sided weakness   THERAPY DIAG:  Muscle weakness (generalized)  Unsteadiness on feet  Difficulty in walking, not elsewhere classified  Rationale for Evaluation and Treatment: Rehabilitation  SUBJECTIVE:                                                                                                                                                                                             SUBJECTIVE STATEMENT: Patient missed his OT appointment due to a mixup of times. Fell Saturday when he was feeding the dog.  Pt accompanied by: self  PERTINENT HISTORY:   Patient presents for Left sided weakness. Patient presented to [facility] on 05/20/22 for L sided weakness and slurred speech.MRI negative.  PMH includes CVA (2019, 2023), obesity, Type II DM, HTN, HLD, lymphedema of BLE, Stage IIIa CKD, anemia, depression, hernia,arthritis, CHF. Patient had multiple strokes in  fall 2023 and received home health. 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 Patient has not worked the past two weeks due to another incident 2-3 weeks ago. Prior to stroke was independent, use a walker occasionally now.   PAIN:  Are you having pain? Yes: NPRS scale: 6/10 Pain location: bilateral knee pain: R>L Pain description: aching Aggravating factors: cold, prolonged standing Relieving factors: sitting down, keeping legs raised, hot shower  PRECAUTIONS: Fall  WEIGHT BEARING RESTRICTIONS: No  FALLS: Has patient fallen in last 6 months? Yes. Number of falls 1  LIVING ENVIRONMENT: Lives with: lives with their family Lives in: House/apartment Stairs: Yes: Internal: flight steps; on right going up Has following equipment at home: Gilford Rile - 2 wheeled  PLOF: Independent  PATIENT GOALS: to get stronger  OBJECTIVE:   DIAGNOSTIC FINDINGS:   MRI: IMPRESSION: 1. No evidence of acute abnormality. 2. Prior hemorrhages,  prior lacunar infarcts, and prior chronic microvascular ischemic disease which is advanced for patient age. Findings could be in part secondary to chronic  hypertension.  CTA:   1. No emergent large vessel occlusion. 2. 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 3. Multifocal irregularity and moderate narrowing of the right vertebral artery, mildly progressed.  COGNITION: Overall cognitive status:  short term memory issues s/p stroke.    SENSATION: Light touch: Impaired LLE  COORDINATION: LLE coordination impaired with delay and limited coordination.   EDEMA:  Bilateral lymphedema of LE's   POSTURE: rounded shoulders, forward head, and flexed trunk    LOWER EXTREMITY MMT:    MMT Right Eval Left Eval  Hip flexion 3 3  Hip extension    Hip abduction 4- 3+  Hip adduction 4- 3+  Hip internal rotation    Hip external rotation    Knee flexion 3+ 3  Knee extension 3+ 3  Ankle dorsiflexion 4- 3-  Ankle plantarflexion 4- 3+  Ankle inversion    Ankle eversion    (Blank rows = not tested)   TRANSFERS: Assistive device utilized: None  Sit to stand: CGA Stand to sit: CGA Chair to chair: CGA    GAIT: Gait pattern: step through pattern, decreased stride length, antalgic, wide BOS, poor foot clearance- Right, and poor foot clearance- Left Distance walked: 40 ft Assistive device utilized: None Level of assistance: CGA Comments: Patient is unsteady without AD, has antalgic gait pattern with R knee however has limited strength for weightbearing on LLE.   FUNCTIONAL TESTS:  5 times sit to stand: 41 seconds  10 meter walk test: 21 seconds increased knee pain 6/10 Berg Balance Scale: 14/56  PATIENT SURVEYS:  FOTO 44  TODAY'S TREATMENT:                                                                                                                              DATE: 06/03/22  BP at start of session:  Seated: 116/71  Standing: 97/65  Neuro  Re-ed Standing with CGA next to support surface: Requires SUE support for all exercises.  Airex pad: static stand 30 seconds x 2 trials, noticeable trembling of ankles/LE's with fatigue and challenge to maintain stability Airex pad: horizontal head turns 30 seconds scanning room 10x ; cueing for arc of motion -extreme dizziness with head turns.  Airex pad: vertical head turns 30 seconds, cueing for arc of motion, noticeable sway with upward gaze increasing demand on ankle righting reaction musculature    TherEx: Standing with # 3 ankle weight: CGA for stability  -Hip extension with B upper extremity support, cueing for neutral hip alignment, upright posture for optimal muscle recruitment, and sequencing, 10x each LE,  -Hip abduction with B upper extremity support, cueing for neutral foot alignment for correct muscle activation, 10x each LE -Hip flexion with B upper extremity support, cueing for body mechanics, speed of muscle recruitment for optimal strengthening and stabilization 10x each LE    Seated with # 3 ankle weights  -Seated marches with upright posture, back away from back of chair for  abdominal/trunk activation/stabilization, 10x each LE -Seated LAQ with 3 second holds, 10x each LE, cueing for muscle activation and sequencing for neutral alignment -Seated IR/ER with cueing for stabilizing knee placement with lateral foot movement for optimal muscle recruitment, 10x each LE -heel raise 15x each LE   Adduction ball squeeze 10x Hamstring isometric pressing into dynadisc 10x each LE  Ambulate to elevator with CGA and cues for safety.    PATIENT EDUCATION: Education details: goals, POC, HEP Person educated: Patient Education method: Explanation, Demonstration, Tactile cues, and Verbal cues Education comprehension: verbalized understanding, returned demonstration, verbal cues required, and tactile cues required  HOME EXERCISE PROGRAM: Access Code: [email or web address] URL:  [email or web address] Date: 05/30/2022 Prepared by: Janna Arch  Exercises - Leg Extension  - 1 x daily - 7 x weekly - 2 sets - 10 reps - 5 hold - Seated March  - 1 x daily - 7 x weekly - 2 sets - 10 reps - 5 hold - Seated Ankle Circles  - 1 x daily - 7 x weekly - 2 sets - 10 reps - 5 hold  GOALS: Goals reviewed with patient? Yes  SHORT TERM GOALS: Target date: 06/27/2022    Patient will be independent in home exercise program to improve strength/mobility for better functional independence with ADLs. Baseline: 2/29: HEP given Goal status: INITIAL   LONG TERM GOALS: Target date: 08/22/2022    Patient will increase FOTO score to equal to or greater than  54%   to demonstrate statistically significant improvement in mobility and quality of life.  Baseline: 2/29: 44%  Goal status: INITIAL  2.  Patient (< 68 years old) will complete five times sit to stand test in < 10 seconds indicating an increased LE strength and improved balance. Baseline: 2/29: 41 seconds with significant UE support Goal status: INITIAL  3.  Patient will increase Berg Balance score by > 10 points (24/56) to demonstrate decreased fall risk during functional activities. Baseline: 2/29: 14/56 Goal status: INITIAL  4.  Patient will increase 10 meter walk test to >1.86ms as to improve gait speed for better community ambulation and to reduce fall risk. Baseline: 2/29: 21 seconds increased knee pain Goal status: INITIAL    ASSESSMENT:  CLINICAL IMPRESSION: Patient requires SUE support for all stability exercises.  Patient's vitals monitored due to history of falling and dizziness. Patient does have orthostatic hypotension this session. Patient had dizziness with head turns this session. Patient's knees tolerate standing with intermittent rest breaks but does have an increase in pain by end of session. Patient does have significant visual field loss. Patient will benefit from skilled physical  therapy to improve strength, stability, and mobility for improved quality of life.   OBJECTIVE IMPAIRMENTS: Abnormal gait, cardiopulmonary status limiting activity, decreased activity tolerance, decreased balance, decreased coordination, decreased endurance, decreased mobility, difficulty walking, decreased strength, increased edema, impaired perceived functional ability, impaired flexibility, impaired sensation, impaired UE functional use, impaired vision/preception, improper body mechanics, postural dysfunction, obesity, and pain.   ACTIVITY LIMITATIONS: carrying, lifting, bending, sitting, standing, squatting, stairs, transfers, bed mobility, bathing, toileting, dressing, reach over head, hygiene/grooming, locomotion level, and caring for others  PARTICIPATION LIMITATIONS: meal prep, cleaning, laundry, personal finances, driving, shopping, community activity, occupation, yard work, school, and church  PERSONAL FACTORS: Age, Behavior pattern, Education, FRestaurant manager, fast food Past/current experiences, Profession, Social background, Time since onset of injury/illness/exacerbation, Transportation, and 3+ comorbidities: CVA (2019, 2023), obesity, Type II DM, HTN, HLD, lymphedema of BLE, Stage IIIa CKD, anemia, depression, hernia,arthritis, CHF  are also affecting patient's functional outcome.   REHAB POTENTIAL: Good  CLINICAL DECISION MAKING: Evolving/moderate complexity  EVALUATION COMPLEXITY: Moderate  PLAN:  PT FREQUENCY: 2x/week  PT DURATION: 12 weeks  PLANNED INTERVENTIONS: Therapeutic exercises, Therapeutic activity, Neuromuscular re-education, Balance training, Gait training, Patient/Family education, Self Care, Joint mobilization, Joint manipulation, Stair training, Vestibular training, Canalith repositioning, Visual/preceptual remediation/compensation, Orthotic/Fit training, DME instructions, Cognitive remediation, Electrical stimulation, Spinal mobilization, Cryotherapy, Manual lymph drainage,  Compression bandaging, Taping, Vasopneumatic device, Traction, Ultrasound, Ionotophoresis '4mg'$ /ml Dexamethasone, Manual therapy, and Re-evaluation  PLAN FOR NEXT SESSION: balance, strength   Janna Arch, PT 06/03/2022, 3:59 PM

## 2022-06-02 ENCOUNTER — Other Ambulatory Visit: Payer: Self-pay | Admitting: Physician Assistant

## 2022-06-02 DIAGNOSIS — I952 Hypotension due to drugs: Secondary | ICD-10-CM

## 2022-06-02 DIAGNOSIS — F419 Anxiety disorder, unspecified: Secondary | ICD-10-CM

## 2022-06-02 DIAGNOSIS — F4321 Adjustment disorder with depressed mood: Secondary | ICD-10-CM

## 2022-06-02 NOTE — Addendum Note (Signed)
Addended by: Darleene Cleaver on: 06/02/2022 04:31 PM   Modules accepted: Orders

## 2022-06-03 ENCOUNTER — Ambulatory Visit: Payer: BC Managed Care – PPO | Attending: Physician Assistant | Admitting: Occupational Therapy

## 2022-06-03 ENCOUNTER — Telehealth: Payer: Self-pay | Admitting: Occupational Therapy

## 2022-06-03 ENCOUNTER — Ambulatory Visit: Payer: BC Managed Care – PPO

## 2022-06-03 DIAGNOSIS — M6281 Muscle weakness (generalized): Secondary | ICD-10-CM

## 2022-06-03 DIAGNOSIS — R278 Other lack of coordination: Secondary | ICD-10-CM | POA: Insufficient documentation

## 2022-06-03 DIAGNOSIS — R2681 Unsteadiness on feet: Secondary | ICD-10-CM | POA: Insufficient documentation

## 2022-06-03 DIAGNOSIS — R262 Difficulty in walking, not elsewhere classified: Secondary | ICD-10-CM | POA: Diagnosis present

## 2022-06-03 NOTE — Telephone Encounter (Signed)
Pt. Did not show up for his OT appointment this afternoon. A call was made to the Pt. Pt. Reports confusion with the schedule this afternoon, and though his appointments began at 3:30. Pt. Reports will attempt to make it to his PT appointment at 3:15. Pt. Was provided with Wednesday's therapy appointment times this week.

## 2022-06-05 ENCOUNTER — Ambulatory Visit: Payer: BC Managed Care – PPO | Admitting: Physical Therapy

## 2022-06-05 ENCOUNTER — Ambulatory Visit: Payer: BC Managed Care – PPO | Admitting: Occupational Therapy

## 2022-06-05 ENCOUNTER — Encounter: Payer: Self-pay | Admitting: Physical Therapy

## 2022-06-05 DIAGNOSIS — R2681 Unsteadiness on feet: Secondary | ICD-10-CM

## 2022-06-05 DIAGNOSIS — R262 Difficulty in walking, not elsewhere classified: Secondary | ICD-10-CM

## 2022-06-05 DIAGNOSIS — R278 Other lack of coordination: Secondary | ICD-10-CM

## 2022-06-05 DIAGNOSIS — M6281 Muscle weakness (generalized): Secondary | ICD-10-CM | POA: Diagnosis not present

## 2022-06-05 NOTE — Therapy (Signed)
OUTPATIENT PHYSICAL THERAPY NEURO TREATMENT   Patient Name: Jeremiah Vance MRN: [id number] DOB:[birth year], 52 y.o., male Today's Date: 06/05/2022   PCP: [name] PA REFERRING PROVIDER: [name] PA  END OF SESSION:  PT End of Session - 06/05/22 1457     Visit Number 3    Number of Visits 24    Date for PT Re-Evaluation 08/22/22    PT Start Time [id number]    PT Stop Time 1559    PT Time Calculation (min) 44 min    Equipment Utilized During Treatment Gait belt    Activity Tolerance Patient tolerated treatment well;Patient limited by pain    Behavior During Therapy [facility] ([place] for tasks assessed/performed               Past Medical History:  Diagnosis Date   Acute ischemic stroke ([place]) 09/06/2017   Acute renal failure superimposed on stage 3a chronic kidney disease ([place]) 07/08/2019   Acute right-sided weakness    Allergies    Anasarca    Anemia 07/10/2017   Arthritis    CHF (congestive heart failure) ([place])    "coinsided w/kidney problems I was having 06/2017"   Chicken pox    CKD (chronic kidney disease) stage 3, GFR 30-59 ml/min (HCC) 06/2017   Depression    Elevated troponin 07/08/2019   High cholesterol    History of cardiomyopathy    LVEF 40 to 45% in April 2019 - subsequently normalized   Hyperbilirubinemia 07/10/2017   Hypertension    Ischemic stroke ([place])    Small left internal capsule infarct due to lacunar disease   Morbid obesity ([place])    Normocytic anemia 12/16/2017   Recurrent incisional hernia with incarceration s/p repair 10/22/2017 10/21/2017   Stroke ([place]) 08/2017   "right sided weakness since; getting stronger though" (10/22/2017)   Type 2 diabetes mellitus ([place])    Past Surgical History:  Procedure Laterality Date   ABDOMINAL HERNIA REPAIR  2008; 10/22/2017   "scope; OPEN REPAIR INCARCERATED VENTRAL HERNIA   HERNIA REPAIR     KNEE ARTHROSCOPY Right 1989   VENTRAL HERNIA REPAIR N/A 10/22/2017   Procedure: OPEN REPAIR INCARCERATED VENTRAL HERNIA;   Surgeon: Excell Seltzer, MD;  Location: [place];  Service: General;  Laterality: N/A;   Patient Active Problem List   Diagnosis Date Noted   Dysarthria 05/20/2022   Colon cancer screening 03/20/2022   Need for diphtheria-tetanus-pertussis (Tdap) vaccine 03/20/2022   Acute CVA (cerebrovascular accident) ([place]) 01/16/2022   Type 2 diabetes mellitus without complication, with long-term current use of insulin ([place]) 01/16/2022   Dyslipidemia 01/16/2022   Chronic diastolic CHF (congestive heart failure) ([place]) 01/16/2022   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    Intracranial hemorrhage (HCC)    HFrEF (heart failure with reduced ejection fraction) ([place])    Hypertensive emergency 11/22/2021   Obesity, Class III, BMI 40-49.9 (morbid obesity) ([place]) 05/22/2020   Cough    Other cirrhosis of liver (HCC)    Ventral hernia without obstruction or gangrene    Acute CHF (congestive heart failure) ([place]) 02/20/2020   Shortness of breath    Hypertensive urgency    Neck pain    Prostate cancer screening 01/21/2020   Lymphedema of both lower extremities 10/17/2019   History of stroke 10/17/2019   CKD (chronic kidney disease) stage 3, GFR 30-59 ml/min (HCC) 08/31/2019   Swelling of limb 08/31/2019   DM (diabetes mellitus), type 2 with complications ([place]) [id number]   Acute kidney  injury superimposed on CKD ([place]) 07/08/2019   Chest pain 07/08/2019   Vitreous floaters of both eyes 06/03/2018   Moderate obstructive sleep apnea 02/11/2018   Chronic fatigue 12/16/2017   Essential hypertension 12/16/2017   History of small bowel obstruction 10/24/2017   Adjustment disorder with depressed mood 09/30/2017   Stroke ([place]) 09/14/2017   Acute on chronic heart failure with preserved ejection fraction (HFpEF) ([place])    Hyperlipidemia    Gout    Elevated LFTs    Heart failure with preserved ejection fraction ([place])    Morbid obesity ([place])    Hypoalbuminemia 07/11/2017   Type  2 diabetes mellitus with hyperglycemia ([place]) 07/10/2017    ONSET DATE: 05/20/22  REFERRING DIAG: Left sided weakness   THERAPY DIAG:  Unsteadiness on feet  Difficulty in walking, not elsewhere classified  Muscle weakness (generalized)  Rationale for Evaluation and Treatment: Rehabilitation  SUBJECTIVE:                                                                                                                                                                                             SUBJECTIVE STATEMENT: Pt report he was sore after last visit particularly in his knees. Pt has no falls since last visit. Pt is going to a trivia night at [employer] place in downtown [place].  Pt accompanied by: self  PERTINENT HISTORY:   Patient presents for Left sided weakness. Patient presented to [facility] on 05/20/22 for L sided weakness and slurred speech.MRI negative.  PMH includes CVA (2019, 2023), obesity, Type II DM, HTN, HLD, lymphedema of BLE, Stage IIIa CKD, anemia, depression, hernia,arthritis, CHF. Patient had multiple strokes in  fall 2023 and received home health. 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 Patient has not worked the past two weeks due to another incident 2-3 weeks ago. Prior to stroke was independent, use a walker occasionally now.   PAIN:  Are you having pain? Yes: NPRS scale: 6/10 Pain location: bilateral knee pain: R>L Pain description: aching Aggravating factors: cold, prolonged standing Relieving factors: sitting down, keeping legs raised, hot shower  PRECAUTIONS: Fall  WEIGHT BEARING RESTRICTIONS: No  FALLS: Has patient fallen in last 6 months? Yes. Number of falls 1  LIVING ENVIRONMENT: Lives with: lives with their family Lives in: House/apartment Stairs: Yes: Internal: flight steps; on right going up Has following equipment at home: Gilford Rile - 2 wheeled  PLOF: Independent  PATIENT GOALS: to get stronger  OBJECTIVE:   DIAGNOSTIC FINDINGS:    MRI: IMPRESSION: 1. No evidence of acute abnormality. 2. Prior hemorrhages, prior lacunar infarcts, and prior chronic microvascular ischemic disease  which is advanced for patient age. Findings could be in part secondary to chronic hypertension.  CTA:   1. No emergent large vessel occlusion. 2. 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 3. Multifocal irregularity and moderate narrowing of the right vertebral artery, mildly progressed.  COGNITION: Overall cognitive status:  short term memory issues s/p stroke.    SENSATION: Light touch: Impaired LLE  COORDINATION: LLE coordination impaired with delay and limited coordination.   EDEMA:  Bilateral lymphedema of LE's   POSTURE: rounded shoulders, forward head, and flexed trunk    LOWER EXTREMITY MMT:    MMT Right Eval Left Eval  Hip flexion 3 3  Hip extension    Hip abduction 4- 3+  Hip adduction 4- 3+  Hip internal rotation    Hip external rotation    Knee flexion 3+ 3  Knee extension 3+ 3  Ankle dorsiflexion 4- 3-  Ankle plantarflexion 4- 3+  Ankle inversion    Ankle eversion    (Blank rows = not tested)   TRANSFERS: Assistive device utilized: None  Sit to stand: CGA Stand to sit: CGA Chair to chair: CGA    GAIT: Gait pattern: step through pattern, decreased stride length, antalgic, wide BOS, poor foot clearance- Right, and poor foot clearance- Left Distance walked: 40 ft Assistive device utilized: None Level of assistance: CGA Comments: Patient is unsteady without AD, has antalgic gait pattern with R knee however has limited strength for weightbearing on LLE.   FUNCTIONAL TESTS:  5 times sit to stand: 41 seconds  10 meter walk test: 21 seconds increased knee pain 6/10 Berg Balance Scale: 14/56  PATIENT SURVEYS:  FOTO 44  TODAY'S TREATMENT:                                                                                                                              DATE:  06/05/22  Unless otherwise stated, CGA was provided and gait belt donned in order to ensure pt safety  TE Nustep 2 x 2 min intervals at level 1 - 1 min rest intervals die to fatigue, seat level 11 and B UE use  -some c/o knee discomfort   REST  Neuro Re-ed Standing with CGA next to support surface: Requires intermittent UE support  Airex pad: static stand 30 seconds x 2 trials, noticeable trembling of ankles/LE's with fatigue and challenge to maintain stability REST Wide tandem 2 x 30 sec ea with seated rest between  REST   TherEx: Standing with # 3 ankle weight: CGA for stability  -Hip extension with B upper extremity support, cueing for neutral hip alignment, upright posture for optimal muscle recruitment, and sequencing, 10x each LE REST -Hip flexion with B upper extremity support, x 10 ea LE     Seated with # 3 ankle weights  -Seated marches with lateral step over x 10 ea LE  REST -Seated LAQ with 3 second holds, 10x each LE, cueing for muscle  activation and sequencing for neutral alignment -heel raise 15x2 sets each LE   Adduction ball squeeze 10x Hamstring isometric pressing into dynadisc 10x R LE, caused some knee pain so did not complete on opposing side   Ambulate to OT for OT session and treatment   Pt required occasional rest breaks due fatigue, PT was quick to ask when pt appeared to be fatiguing in order to prevent excessive fatigue.    PATIENT EDUCATION: Education details: goals, POC, HEP Person educated: Patient Education method: Explanation, Demonstration, Tactile cues, and Verbal cues Education comprehension: verbalized understanding, returned demonstration, verbal cues required, and tactile cues required  HOME EXERCISE PROGRAM: Access Code: [email or web address] URL: [email or web address] Date: 05/30/2022 Prepared by: Janna Arch  Exercises - Leg Extension  - 1 x daily - 7 x weekly - 2 sets - 10 reps - 5 hold - Seated March  - 1 x daily - 7  x weekly - 2 sets - 10 reps - 5 hold - Seated Ankle Circles  - 1 x daily - 7 x weekly - 2 sets - 10 reps - 5 hold  GOALS: Goals reviewed with patient? Yes  SHORT TERM GOALS: Target date: 06/27/2022    Patient will be independent in home exercise program to improve strength/mobility for better functional independence with ADLs. Baseline: 2/29: HEP given Goal status: INITIAL   LONG TERM GOALS: Target date: 08/22/2022    Patient will increase FOTO score to equal to or greater than  54%   to demonstrate statistically significant improvement in mobility and quality of life.  Baseline: 2/29: 44%  Goal status: INITIAL  2.  Patient (< 28 years old) will complete five times sit to stand test in < 10 seconds indicating an increased LE strength and improved balance. Baseline: 2/29: 41 seconds with significant UE support Goal status: INITIAL  3.  Patient will increase Berg Balance score by > 10 points (24/56) to demonstrate decreased fall risk during functional activities. Baseline: 2/29: 14/56 Goal status: INITIAL  4.  Patient will increase 10 meter walk test to >1.74ms as to improve gait speed for better community ambulation and to reduce fall risk. Baseline: 2/29: 21 seconds increased knee pain Goal status: INITIAL    ASSESSMENT:  CLINICAL IMPRESSION:  Pt presents with good motivation for completion of PT activities. Pt requires frequent rest breaks, particularly when challenged with standing activities. Pt not able to maintain nustep greater than 2 minutes at a time and was challenged with this but may be due to knee pain. Will consider ambulation style endurance training in future sessions if indicated. Pt will continue to benefit from skilled physical therapy intervention to address impairments, improve QOL, and attain therapy goals.    OBJECTIVE IMPAIRMENTS: Abnormal gait, cardiopulmonary status limiting activity, decreased activity tolerance, decreased balance, decreased  coordination, decreased endurance, decreased mobility, difficulty walking, decreased strength, increased edema, impaired perceived functional ability, impaired flexibility, impaired sensation, impaired UE functional use, impaired vision/preception, improper body mechanics, postural dysfunction, obesity, and pain.   ACTIVITY LIMITATIONS: carrying, lifting, bending, sitting, standing, squatting, stairs, transfers, bed mobility, bathing, toileting, dressing, reach over head, hygiene/grooming, locomotion level, and caring for others  PARTICIPATION LIMITATIONS: meal prep, cleaning, laundry, personal finances, driving, shopping, community activity, occupation, yard work, school, and church  PERSONAL FACTORS: Age, Behavior pattern, Education, FRestaurant manager, fast food Past/current experiences, Profession, Social background, Time since onset of injury/illness/exacerbation, Transportation, and 3+ comorbidities: CVA (2019, 2023), obesity, Type II DM, HTN, HLD, lymphedema of BLE, Stage IIIa CKD, anemia, depression, hernia,arthritis,  CHF  are also affecting patient's functional outcome.   REHAB POTENTIAL: Good  CLINICAL DECISION MAKING: Evolving/moderate complexity  EVALUATION COMPLEXITY: Moderate  PLAN:  PT FREQUENCY: 2x/week  PT DURATION: 12 weeks  PLANNED INTERVENTIONS: Therapeutic exercises, Therapeutic activity, Neuromuscular re-education, Balance training, Gait training, Patient/Family education, Self Care, Joint mobilization, Joint manipulation, Stair training, Vestibular training, Canalith repositioning, Visual/preceptual remediation/compensation, Orthotic/Fit training, DME instructions, Cognitive remediation, Electrical stimulation, Spinal mobilization, Cryotherapy, Manual lymph drainage, Compression bandaging, Taping, Vasopneumatic device, Traction, Ultrasound, Ionotophoresis '4mg'$ /ml Dexamethasone, Manual therapy, and Re-evaluation  PLAN FOR NEXT SESSION: balance, strength   Particia Lather, PT 06/05/2022,  4:46 PM

## 2022-06-06 NOTE — Therapy (Addendum)
OUTPATIENT OCCUPATIONAL THERAPY NEURO TREATMENT NOTE  Patient Name: Jeremiah Vance MRN: [id number] DOB:05-02-1970, 52 y.o., male Today's Date: 05/30/2022  PCP: [name], PA-C REFERRING PROVIDER: [name], PA-C  END OF SESSION:  OT End of Session    Visit Number 2   Number of Visits 24    Date for OT Re-Evaluation 08/22/22    Progress Note Due on Visit 10    OT Start Time 33   OT Stop Time 1645   OT Time Calculation (min) 45 min    Equipment Utilized During Treatment transport chair    Activity Tolerance Patient tolerated treatment well    Behavior During Therapy [facility] for tasks assessed/performed             Past Medical History:  Diagnosis Date   Acute ischemic stroke ([place]) 09/06/2017   Acute renal failure superimposed on stage 3a chronic kidney disease ([place]) 07/08/2019   Acute right-sided weakness    Allergies    Anasarca    Anemia 07/10/2017   Arthritis    CHF (congestive heart failure) ([place])    "coinsided w/kidney problems I was having 06/2017"   Chicken pox    CKD (chronic kidney disease) stage 3, GFR 30-59 ml/min ([place]) 06/2017   Depression    Elevated troponin 07/08/2019   High cholesterol    History of cardiomyopathy    LVEF 40 to 45% in April 2019 - subsequently normalized   Hyperbilirubinemia 07/10/2017   Hypertension    Ischemic stroke ([place])    Small left internal capsule infarct due to lacunar disease   Morbid obesity ([place])    Normocytic anemia 12/16/2017   Recurrent incisional hernia with incarceration s/p repair 10/22/2017 10/21/2017   Stroke ([place]) 08/2017   "right sided weakness since; getting stronger though" (10/22/2017)   Type 2 diabetes mellitus ([place])    Past Surgical History:  Procedure Laterality Date   ABDOMINAL HERNIA REPAIR  2008; 10/22/2017   "scope; OPEN REPAIR INCARCERATED VENTRAL HERNIA   HERNIA REPAIR     KNEE ARTHROSCOPY Right 1989   VENTRAL HERNIA REPAIR N/A 10/22/2017   Procedure: OPEN REPAIR INCARCERATED VENTRAL HERNIA;   Surgeon: Excell Seltzer, MD;  Location: [place];  Service: General;  Laterality: N/A;   Patient Active Problem List   Diagnosis Date Noted   Dysarthria 05/20/2022   Colon cancer screening 03/20/2022   Need for diphtheria-tetanus-pertussis (Tdap) vaccine 03/20/2022   Acute CVA (cerebrovascular accident) ([place]) 01/16/2022   Type 2 diabetes mellitus without complication, with long-term current use of insulin ([place]) 01/16/2022   Dyslipidemia 01/16/2022   Chronic diastolic CHF (congestive heart failure) ([place]) 01/16/2022   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    Intracranial hemorrhage (HCC)    HFrEF (heart failure with reduced ejection fraction) ([place])    Hypertensive emergency 11/22/2021   Obesity, Class III, BMI 40-49.9 (morbid obesity) ([place]) 05/22/2020   Cough    Other cirrhosis of liver ([place])    Ventral hernia without obstruction or gangrene    Acute CHF (congestive heart failure) ([place]) 02/20/2020   Shortness of breath    Hypertensive urgency    Neck pain    Prostate cancer screening 01/21/2020   Lymphedema of both lower extremities 10/17/2019   History of stroke 10/17/2019   CKD (chronic kidney disease) stage 3, GFR 30-59 ml/min ([place]) 08/31/2019   Swelling of limb 08/31/2019   DM (diabetes mellitus), type 2 with complications ([place]) 78/29/5621   Acute kidney injury superimposed on CKD (  [place]) 07/08/2019   Chest pain 07/08/2019   Vitreous floaters of both eyes 06/03/2018   Moderate obstructive sleep apnea 02/11/2018   Chronic fatigue 12/16/2017   Essential hypertension 12/16/2017   History of small bowel obstruction 10/24/2017   Adjustment disorder with depressed mood 09/30/2017   Stroke ([place]) 09/14/2017   Acute on chronic heart failure with preserved ejection fraction (HFpEF) ([place])    Hyperlipidemia    Gout    Elevated LFTs    Heart failure with preserved ejection fraction ([place])    Morbid obesity ([place])    Hypoalbuminemia 07/11/2017   Type  2 diabetes mellitus with hyperglycemia ([place]) 07/10/2017    ONSET DATE: Aug 2023  REFERRING DIAG: L sided weakness  THERAPY DIAG:  Muscle weakness (generalized)  Other lack of coordination  Rationale for Evaluation and Treatment: Rehabilitation  SUBJECTIVE: Pt endorses residual weakness in the L arm from CVA in Aug of 2023.  SUBJECTIVE STATEMENT:   PERTINENT HISTORY: Pt reports 2-3 CVAs in Aug of 2023, but a total of 5 CVAs over the course of 4 years.  After the strokes in Aug, pt participated [facility] therapy for 2-3 months.  Pt reports that he had been able to return to work up until 05/20/22.  Pt reported that his work felt like he was exhibiting stroke symptoms, so he went to the ED.  Per chart from ED visit on 05/20/22:     Clinical Course as of 05/20/22 1559  Mon May 20, 2022  1505 Prior CVA with soft blood pressures - reactivation of old CVA.  CTA negative. Code stroke and if MRI negative then will dc home.  [SM]     Clinical Course User Index [SM] [name], MD  MRI negative.  Patient states that he is feeling better.  Blood pressure improved while in the emergency department.  Last blood pressure 269 systolic.  Patient discharged home in stable condition with discussion to follow-up as an outpatient and return precautions given.   [name], MD 05/20/22 1733     PRECAUTIONS: fall  WEIGHT BEARING RESTRICTIONS: No  PAIN:  Are you having pain? Yes: NPRS scale: 7-8/10 Pain location: R shoulder (arthritic) Pain description: achy Aggravating factors: cold weather, walking, stairs Relieving factors: rest, elevation  FALLS: Has patient fallen in last 6 months? Yes. 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  PATIENT GOALS: To get the L side stronger and return to work.   OBJECTIVE:   HAND DOMINANCE: Right  ADLs: Overall ADLs: modified indep with primarily use of the R arm. Transfers/ambulation related to ADLs: uses the walker outside, indoors no walker Eating: sister cuts food  Grooming: indep UB Dressing: extra time to manage clothing fasteners LB Dressing: slip on shoes; tucks laces into shoes Toileting: indep Bathing: modified indep Tub Shower transfers: walk in shower Equipment: see above  IADLs: Shopping: pt can accompany sister and pt pushes the cart Light housekeeping: shared task between pt and sister  Meal Prep: some difficulty opening new packages/containers but can manage  simple light meal prep/microwavable meals  Community mobility: able to drive, limited ambulator d/t obesity, arthritic knees, and L sided weakness Medication management: sister sets up pills into weekly pill organizer  Financial management: pt manages  Handwriting: No issues (pt is R hand dominant)  POSTURE COMMENTS:  rounded shoulders  ACTIVITY TOLERANCE: Activity tolerance: fair; limited community ambulator  FUNCTIONAL OUTCOME MEASURES: FOTO: 52; predicted 55  UPPER EXTREMITY ROM:    Active ROM Right Eval WNL Left eval  Shoulder flexion  98 (limited by pain)  Shoulder abduction  102  Shoulder adduction    Shoulder extension    Shoulder internal rotation    Shoulder external rotation    Elbow flexion    Elbow extension    Wrist flexion    Wrist extension    Wrist ulnar deviation    Wrist radial deviation    Wrist pronation    Wrist supination    (Blank rows = not tested)  UPPER EXTREMITY MMT:     MMT Right eval Left eval  Shoulder flexion 4+ NT (pain)  Shoulder  abduction 4 NT (pain)  Shoulder adduction    Shoulder extension    Shoulder internal rotation    Shoulder external rotation    Middle trapezius    Lower trapezius    Elbow flexion 5 4-  Elbow extension 5 4-  Wrist flexion 5 3+  Wrist extension 5 3+  Wrist ulnar deviation    Wrist radial deviation    Wrist pronation    Wrist supination    (Blank rows = not tested)  HAND FUNCTION: Grip strength: Right: 71 lbs; Left: 27 lbs, Lateral pinch: Right: 16 lbs, Left: 6 lbs, and 3 point pinch: Right: 18 lbs, Left: 8 lbs  COORDINATION: Finger Nose Finger test: L sided good accuracy but slower  9 Hole Peg test: Right: 38 sec; Left: 46 sec Moderate difficulty opposing L hand digits to thumb   SENSATION: Light touch: Impaired , pt reports some L hand numbness ulnar nerve distribution volar and dorsal aspect, does not go up the arm  EDEMA: none  MUSCLE TONE: normal  COGNITION: Overall cognitive status: WFL for tasks assessed  VISION: Subjective report: 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, wears reading glasses all the time and reports increased blurred vision in the R eye since strokes in Aug of 2023  VISION ASSESSMENT: Tracking/Visual pursuits: impaired Saccades: impaired Visual Fields: pt limited in all visual fields of R eye (chart notes floaters both eyes)  Patient has difficulty with following activities due to following visual impairments: pt does not drive  PERCEPTION: WFL  PRAXIS: Impaired: Motor planning  TODAY'S TREATMENT:                                                                                                                              DATE: 06/05/22  Therapeutic Exercise:  Pt. performed gross gripping with a gross grip strengthener. Pt.  worked on sustaining grip while grasping pegs, and placing them into a container placed at the tabletop. The gripper was set to 11.2# of grip strength resistance. Pt. worked on pinch strengthening in the left hand for  lateral, and 3pt. pinch using yellow, red, and green resistive clips. Pt. worked on placing the clips at various vertical and horizontal angles. Tactile and verbal cues were required for eliciting the desired movement.   Neuromuscular re-education:  Pt. worked on left hand [facility] skills grasping 1/2" flat marbles. Pt. worked on grasping, and storing the marbles, the translatory skills moving the objects through her hand from her palm to the tip of the 2nd digit and thumb. Pt. worked on grasping coins from a tabletop surface, placing them into a resistive container, and pushing them through the slot while isolating his 2nd digit. Pt. worked on moving the coins to the edge of a flat surface isolating her 2nd digit to the thumb to form the grasp in preparation for placing them into a resistive container.     PATIENT EDUCATION: Education details: OT role, goals, poc, theraputty exercises Person educated: Patient Education method: Explanation and Verbal cues Education comprehension: verbalized understanding  HOME EXERCISE PROGRAM: Theraputty   GOALS: Goals reviewed with patient? Yes  SHORT TERM GOALS: Target date: 07/11/22 (6 weeks)  Pt will be indep to perform HEP for increasing strength and coordination throughout the LUE. Baseline: Eval: issued putty exercises Goal status: INITIAL   LONG TERM GOALS: Target date: 08/22/22 (12 weeks)  Pt will increase FOTO score to 55 or better to indicate clinically relevant improvement in self perceived use of L arm to perform daily tasks.  Baseline: Eval: 52 Goal status: INITIAL  2.  Pt will increase L grip strength by 10 or more lbs in order to hold and carry ADL supplies in L hand. Baseline: Eval: L grip 27 lbs (R 71 lbs) Goal status: INITIAL  3.  Pt will increase LUE strength at the elbow and wrist in order to carry a heavy laundry basket with BUEs. Baseline: L elbow flex 4-, wrist flex 3+ Goal status: INITIAL  4.  Pt will increase L lateral  pinch strength in order to ease ability to open small bottles and food packages.  Baseline: L 6 lbs (R 16 lbs) Goal status: INITIAL  5.  Pt will increase L FMC/dexterity skills to ease ability to manipulate small ADL supplies.  Baseline: L 9 hole 48 sec (R 36 sec); extra time to manage clothing fasteners and to pick up pills Goal status: INITIAL   ASSESSMENT:  CLINICAL IMPRESSION:  Pt. Tolerated the session well, however presents with 7-8/10 pain in the right shoulder. Pt. was able to tolerate left hand grip strength set at 11.3#. Pt. required cues for hand position for lateral, and 3pt. pinch strengthening. Pt. Required visual cues to the left during the session. Pt. continues to present with limited UE strength, and [facility] skills. Pt. Continues to benefit form OT services to work on improving LUE functioning, and maximizing independence with ADLs, and IADLs.  PERFORMANCE DEFICITS: in functional skills including ADLs, IADLs, coordination, dexterity, sensation, strength, pain, Fine motor control, Gross motor control, mobility, balance, body mechanics, endurance, decreased knowledge of use of DME, vision, and UE functional use, and psychosocial skills including coping strategies, environmental adaptation, habits, and routines and behaviors.   IMPAIRMENTS: are limiting patient from ADLs and IADLs.   CO-MORBIDITIES: has co-morbidities such as legally blind L eye, obesity, arthritis  that affects occupational  performance. Patient will benefit from skilled OT to address above impairments and improve overall function.  MODIFICATION OR ASSISTANCE TO COMPLETE EVALUATION: No modification of tasks or assist necessary to complete an evaluation.  OT OCCUPATIONAL PROFILE AND HISTORY: Problem focused assessment: Including review of records relating to presenting problem.  CLINICAL DECISION MAKING: Moderate - several treatment options, min-mod task modification necessary  REHAB POTENTIAL: Good  EVALUATION  COMPLEXITY: Moderate    PLAN:  OT FREQUENCY: 2x/week  OT DURATION: 12 weeks  PLANNED INTERVENTIONS: self care/ADL training, therapeutic exercise, therapeutic activity, neuromuscular re-education, manual therapy, passive range of motion, balance training, functional mobility training, moist heat, cryotherapy, contrast bath, patient/family education, energy conservation, coping strategies training, and DME and/or AE instructions  RECOMMENDED OTHER SERVICES: None at this time  CONSULTED AND AGREED WITH PLAN OF CARE: Patient  PLAN FOR NEXT SESSION: see above  Harrel Carina, MS, OTR/L

## 2022-06-08 ENCOUNTER — Other Ambulatory Visit: Payer: Self-pay | Admitting: Physician Assistant

## 2022-06-08 DIAGNOSIS — I952 Hypotension due to drugs: Secondary | ICD-10-CM

## 2022-06-08 DIAGNOSIS — F4321 Adjustment disorder with depressed mood: Secondary | ICD-10-CM

## 2022-06-08 DIAGNOSIS — F419 Anxiety disorder, unspecified: Secondary | ICD-10-CM

## 2022-06-10 ENCOUNTER — Ambulatory Visit: Payer: BC Managed Care – PPO | Admitting: Physical Therapy

## 2022-06-10 ENCOUNTER — Ambulatory Visit: Payer: BC Managed Care – PPO | Admitting: Occupational Therapy

## 2022-06-10 MED ORDER — BUSPIRONE HCL 7.5 MG PO TABS: 7.5000 mg | ORAL_TABLET | Freq: Two times a day (BID) | ORAL | 3 refills | Status: DC

## 2022-06-11 NOTE — Therapy (Unsigned)
OUTPATIENT PHYSICAL THERAPY NEURO TREATMENT   Patient Name: Jeremiah Vance MRN: [id number] DOB:[birth year], 52 y.o., male Today's Date: 06/12/2022   PCP: [name] PA REFERRING PROVIDER: [name] PA  END OF SESSION:  PT End of Session - 06/12/22 1509     Visit Number 4    Number of Visits 24    Date for PT Re-Evaluation 08/22/22    PT Start Time [id number]    PT Stop Time 1559    PT Time Calculation (min) 42 min    Equipment Utilized During Treatment Gait belt    Activity Tolerance Patient tolerated treatment well;Patient limited by pain    Behavior During Therapy [facility] for tasks assessed/performed                Past Medical History:  Diagnosis Date   Acute ischemic stroke ([place]) 09/06/2017   Acute renal failure superimposed on stage 3a chronic kidney disease ([place]) 07/08/2019   Acute right-sided weakness    Allergies    Anasarca    Anemia 07/10/2017   Arthritis    CHF (congestive heart failure) ([place])    "coinsided w/kidney problems I was having 06/2017"   Chicken pox    CKD (chronic kidney disease) stage 3, GFR 30-59 ml/min ([place]) 06/2017   Depression    Elevated troponin 07/08/2019   High cholesterol    History of cardiomyopathy    LVEF 40 to 45% in April 2019 - subsequently normalized   Hyperbilirubinemia 07/10/2017   Hypertension    Ischemic stroke ([place])    Small left internal capsule infarct due to lacunar disease   Morbid obesity ([place])    Normocytic anemia 12/16/2017   Recurrent incisional hernia with incarceration s/p repair 10/22/2017 10/21/2017   Stroke ([place]) 08/2017   "right sided weakness since; getting stronger though" (10/22/2017)   Type 2 diabetes mellitus ([place])    Past Surgical History:  Procedure Laterality Date   ABDOMINAL HERNIA REPAIR  2008; 10/22/2017   "scope; OPEN REPAIR INCARCERATED VENTRAL HERNIA   HERNIA REPAIR     KNEE ARTHROSCOPY Right 1989   VENTRAL HERNIA REPAIR N/A 10/22/2017   Procedure: OPEN REPAIR INCARCERATED VENTRAL HERNIA;   Surgeon: Excell Seltzer, MD;  Location: [place];  Service: General;  Laterality: N/A;   Patient Active Problem List   Diagnosis Date Noted   Dysarthria 05/20/2022   Colon cancer screening 03/20/2022   Need for diphtheria-tetanus-pertussis (Tdap) vaccine 03/20/2022   Acute CVA (cerebrovascular accident) ([place]) 01/16/2022   Type 2 diabetes mellitus without complication, with long-term current use of insulin ([place]) 01/16/2022   Dyslipidemia 01/16/2022   Chronic diastolic CHF (congestive heart failure) ([place]) 01/16/2022   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    Intracranial hemorrhage (HCC)    HFrEF (heart failure with reduced ejection fraction) ([place])    Hypertensive emergency 11/22/2021   Obesity, Class III, BMI 40-49.9 (morbid obesity) ([place]) 05/22/2020   Cough    Other cirrhosis of liver (HCC)    Ventral hernia without obstruction or gangrene    Acute CHF (congestive heart failure) ([place]) 02/20/2020   Shortness of breath    Hypertensive urgency    Neck pain    Prostate cancer screening 01/21/2020   Lymphedema of both lower extremities 10/17/2019   History of stroke 10/17/2019   CKD (chronic kidney disease) stage 3, GFR 30-59 ml/min (HCC) 08/31/2019   Swelling of limb 08/31/2019   DM (diabetes mellitus), type 2 with complications ([place]) [id number]   Acute  kidney injury superimposed on CKD ([place]) 07/08/2019   Chest pain 07/08/2019   Vitreous floaters of both eyes 06/03/2018   Moderate obstructive sleep apnea 02/11/2018   Chronic fatigue 12/16/2017   Essential hypertension 12/16/2017   History of small bowel obstruction 10/24/2017   Adjustment disorder with depressed mood 09/30/2017   Stroke ([place]) 09/14/2017   Acute on chronic heart failure with preserved ejection fraction (HFpEF) (HCC)    Hyperlipidemia    Gout    Elevated LFTs    Heart failure with preserved ejection fraction ([place])    Morbid obesity ([place])    Hypoalbuminemia 07/11/2017   Type  2 diabetes mellitus with hyperglycemia ([place]) 07/10/2017    ONSET DATE: 05/20/22  REFERRING DIAG: Left sided weakness   THERAPY DIAG:  Muscle weakness (generalized)  Unsteadiness on feet  Rationale for Evaluation and Treatment: Rehabilitation  SUBJECTIVE:                                                                                                                                                                                             SUBJECTIVE STATEMENT: Pt reports he was sore after last visit but it was tolerable and he felt better overall.  Pt accompanied by: self  PERTINENT HISTORY:   Patient presents for Left sided weakness. Patient presented to [facility] on 05/20/22 for L sided weakness and slurred speech.MRI negative.  PMH includes CVA (2019, 2023), obesity, Type II DM, HTN, HLD, lymphedema of BLE, Stage IIIa CKD, anemia, depression, hernia,arthritis, CHF. Patient had multiple strokes in  fall 2023 and received home health. 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 Patient has not worked the past two weeks due to another incident 2-3 weeks ago. Prior to stroke was independent, use a walker occasionally now.   PAIN:  Are you having pain? Yes: NPRS scale: 4/10 Pain location: bilateral knee pain: R>L Pain description: aching Aggravating factors: cold, prolonged standing Relieving factors: sitting down, keeping legs raised, hot shower  PRECAUTIONS: Fall  WEIGHT BEARING RESTRICTIONS: No  FALLS: Has patient fallen in last 6 months? Yes. Number of falls 1  LIVING ENVIRONMENT: Lives with: lives with their family Lives in: House/apartment Stairs: Yes: Internal: flight steps; on right going up Has following equipment at home: Gilford Rile - 2 wheeled  PLOF: Independent  PATIENT GOALS: to get stronger  OBJECTIVE:   DIAGNOSTIC FINDINGS:   MRI: IMPRESSION: 1. No evidence of acute abnormality. 2. Prior hemorrhages, prior lacunar infarcts, and prior chronic microvascular  ischemic disease which is advanced for patient age. Findings could be in part secondary to chronic hypertension.  CTA:   1. No  emergent large vessel occlusion. 2. 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 3. Multifocal irregularity and moderate narrowing of the right vertebral artery, mildly progressed.  COGNITION: Overall cognitive status:  short term memory issues s/p stroke.    SENSATION: Light touch: Impaired LLE  COORDINATION: LLE coordination impaired with delay and limited coordination.   EDEMA:  Bilateral lymphedema of LE's   POSTURE: rounded shoulders, forward head, and flexed trunk    LOWER EXTREMITY MMT:    MMT Right Eval Left Eval  Hip flexion 3 3  Hip extension    Hip abduction 4- 3+  Hip adduction 4- 3+  Hip internal rotation    Hip external rotation    Knee flexion 3+ 3  Knee extension 3+ 3  Ankle dorsiflexion 4- 3-  Ankle plantarflexion 4- 3+  Ankle inversion    Ankle eversion    (Blank rows = not tested)   TRANSFERS: Assistive device utilized: None  Sit to stand: CGA Stand to sit: CGA Chair to chair: CGA    GAIT: Gait pattern: step through pattern, decreased stride length, antalgic, wide BOS, poor foot clearance- Right, and poor foot clearance- Left Distance walked: 40 ft Assistive device utilized: None Level of assistance: CGA Comments: Patient is unsteady without AD, has antalgic gait pattern with R knee however has limited strength for weightbearing on LLE.   FUNCTIONAL TESTS:  5 times sit to stand: 41 seconds  10 meter walk test: 21 seconds increased knee pain 6/10 Berg Balance Scale: 14/56  PATIENT SURVEYS:  FOTO 44  TODAY'S TREATMENT:                                                                                                                              DATE: 06/12/22  Unless otherwise stated, CGA was provided and gait belt donned in order to ensure pt safety  TE Nustep 2 x 2 min intervals at level  1 - 1 min rest intervals die to fatigue, seat level 11 and B UE use  -some c/o knee discomfort   REST  Neuro Re-ed Standing with CGA next to support surface: Requires intermittent UE support   Airex pad: static stand 30 seconds x 2 trials, noticeable trembling of ankles/LE's with fatigue and challenge to maintain stability  Wide tandem 2 x 30 sec ea with seated rest between    TherEx:  -Hip extension with B upper extremity support, cueing for neutral hip alignment, upright posture for optimal muscle recruitment, and sequencing, 10x each LE  Seated with # 3 ankle weights  -Seated marches with lateral step over x 10 ea LE   -Seated LAQ with 3 second holds, 10x each LE, cueing for muscle activation and sequencing for neutral alignment -heel raise 15x2 sets each LE   REST  Adduction ball squeeze 10x 2 sets     Pt required occasional rest breaks due fatigue, PT was quick to ask when pt appeared to be fatiguing in  order to prevent excessive fatigue.    PATIENT EDUCATION: Education details: goals, POC, HEP Person educated: Patient Education method: Explanation, Demonstration, Tactile cues, and Verbal cues Education comprehension: verbalized understanding, returned demonstration, verbal cues required, and tactile cues required  HOME EXERCISE PROGRAM: Access Code: [email or web address] URL: [email or web address] Date: 05/30/2022 Prepared by: Janna Arch  Exercises - Leg Extension  - 1 x daily - 7 x weekly - 2 sets - 10 reps - 5 hold - Seated March  - 1 x daily - 7 x weekly - 2 sets - 10 reps - 5 hold - Seated Ankle Circles  - 1 x daily - 7 x weekly - 2 sets - 10 reps - 5 hold  GOALS: Goals reviewed with patient? Yes  SHORT TERM GOALS: Target date: 06/27/2022    Patient will be independent in home exercise program to improve strength/mobility for better functional independence with ADLs. Baseline: 2/29: HEP given Goal status: INITIAL   LONG TERM GOALS: Target date:  08/22/2022    Patient will increase FOTO score to equal to or greater than  54%   to demonstrate statistically significant improvement in mobility and quality of life.  Baseline: 2/29: 44%  Goal status: INITIAL  2.  Patient (< 54 years old) will complete five times sit to stand test in < 10 seconds indicating an increased LE strength and improved balance. Baseline: 2/29: 41 seconds with significant UE support Goal status: INITIAL  3.  Patient will increase Berg Balance score by > 10 points (24/56) to demonstrate decreased fall risk during functional activities. Baseline: 2/29: 14/56 Goal status: INITIAL  4.  Patient will increase 10 meter walk test to >1.28ms as to improve gait speed for better community ambulation and to reduce fall risk. Baseline: 2/29: 21 seconds increased knee pain Goal status: INITIAL    ASSESSMENT:  CLINICAL IMPRESSION:  Pt presents with good motivation for completion of PT activities. Pt requires frequent rest breaks, particularly when challenged with standing activities. Pt better tolerating prolonged exercises at this time.  Will consider ambulation style endurance training in future sessions if indicated. Pt will continue to benefit from skilled physical therapy intervention to address impairments, improve QOL, and attain therapy goals.    OBJECTIVE IMPAIRMENTS: Abnormal gait, cardiopulmonary status limiting activity, decreased activity tolerance, decreased balance, decreased coordination, decreased endurance, decreased mobility, difficulty walking, decreased strength, increased edema, impaired perceived functional ability, impaired flexibility, impaired sensation, impaired UE functional use, impaired vision/preception, improper body mechanics, postural dysfunction, obesity, and pain.   ACTIVITY LIMITATIONS: carrying, lifting, bending, sitting, standing, squatting, stairs, transfers, bed mobility, bathing, toileting, dressing, reach over head,  hygiene/grooming, locomotion level, and caring for others  PARTICIPATION LIMITATIONS: meal prep, cleaning, laundry, personal finances, driving, shopping, community activity, occupation, yard work, school, and church  PERSONAL FACTORS: Age, Behavior pattern, Education, FRestaurant manager, fast food Past/current experiences, Profession, Social background, Time since onset of injury/illness/exacerbation, Transportation, and 3+ comorbidities: CVA (2019, 2023), obesity, Type II DM, HTN, HLD, lymphedema of BLE, Stage IIIa CKD, anemia, depression, hernia,arthritis, CHF  are also affecting patient's functional outcome.   REHAB POTENTIAL: Good  CLINICAL DECISION MAKING: Evolving/moderate complexity  EVALUATION COMPLEXITY: Moderate  PLAN:  PT FREQUENCY: 2x/week  PT DURATION: 12 weeks  PLANNED INTERVENTIONS: Therapeutic exercises, Therapeutic activity, Neuromuscular re-education, Balance training, Gait training, Patient/Family education, Self Care, Joint mobilization, Joint manipulation, Stair training, Vestibular training, Canalith repositioning, Visual/preceptual remediation/compensation, Orthotic/Fit training, DME instructions, Cognitive remediation, Electrical stimulation, Spinal mobilization, Cryotherapy, Manual lymph drainage, Compression bandaging, Taping, Vasopneumatic device, Traction, Ultrasound,  Ionotophoresis '4mg'$ /ml Dexamethasone, Manual therapy, and Re-evaluation  PLAN FOR NEXT SESSION: balance, strength, ambulatory endurance training    Particia Lather, PT 06/12/2022, 5:02 PM

## 2022-06-12 ENCOUNTER — Ambulatory Visit: Payer: BC Managed Care – PPO | Admitting: Physical Therapy

## 2022-06-12 ENCOUNTER — Encounter: Payer: Self-pay | Admitting: Physical Therapy

## 2022-06-12 ENCOUNTER — Ambulatory Visit: Payer: BC Managed Care – PPO

## 2022-06-12 DIAGNOSIS — R2681 Unsteadiness on feet: Secondary | ICD-10-CM

## 2022-06-12 DIAGNOSIS — M6281 Muscle weakness (generalized): Secondary | ICD-10-CM

## 2022-06-12 DIAGNOSIS — R278 Other lack of coordination: Secondary | ICD-10-CM

## 2022-06-13 NOTE — Therapy (Signed)
OUTPATIENT OCCUPATIONAL THERAPY NEURO TREATMENT NOTE  Patient Name: Jeremiah Vance MRN: [id number] DOB:1970/10/22, 52 y.o., male Today's Date: 06/13/2022  PCP: [name], PA-C REFERRING PROVIDER: [name], PA-C   OT End of Session - 06/12/22 1600     Visit Number 3    Number of Visits 24    Date for OT Re-Evaluation 08/22/22    Progress Note Due on Visit 10    OT Start Time 11    OT Stop Time 1645    OT Time Calculation (min) 45 min    Equipment Utilized During Treatment transport chair    Activity Tolerance Patient tolerated treatment well    Behavior During Therapy [facility] for tasks assessed/performed               Past Medical History:  Diagnosis Date   Acute ischemic stroke ([place]) 09/06/2017   Acute renal failure superimposed on stage 3a chronic kidney disease ([place]) 07/08/2019   Acute right-sided weakness    Allergies    Anasarca    Anemia 07/10/2017   Arthritis    CHF (congestive heart failure) ([place])    "coinsided w/kidney problems I was having 06/2017"   Chicken pox    CKD (chronic kidney disease) stage 3, GFR 30-59 ml/min (HCC) 06/2017   Depression    Elevated troponin 07/08/2019   High cholesterol    History of cardiomyopathy    LVEF 40 to 45% in April 2019 - subsequently normalized   Hyperbilirubinemia 07/10/2017   Hypertension    Ischemic stroke ([place])    Small left internal capsule infarct due to lacunar disease   Morbid obesity ([place])    Normocytic anemia 12/16/2017   Recurrent incisional hernia with incarceration s/p repair 10/22/2017 10/21/2017   Stroke ([place]) 08/2017   "right sided weakness since; getting stronger though" (10/22/2017)   Type 2 diabetes mellitus ([place])    Past Surgical History:  Procedure Laterality Date   ABDOMINAL HERNIA REPAIR  2008; 10/22/2017   "scope; OPEN REPAIR INCARCERATED VENTRAL HERNIA   HERNIA REPAIR     KNEE ARTHROSCOPY Right 1989   VENTRAL HERNIA REPAIR N/A 10/22/2017   Procedure: OPEN REPAIR INCARCERATED VENTRAL  HERNIA;  Surgeon: Excell Seltzer, MD;  Location: [facility];  Service: General;  Laterality: N/A;   Patient Active Problem List   Diagnosis Date Noted   Dysarthria 05/20/2022   Colon cancer screening 03/20/2022   Need for diphtheria-tetanus-pertussis (Tdap) vaccine 03/20/2022   Acute CVA (cerebrovascular accident) ([place]) 01/16/2022   Type 2 diabetes mellitus without complication, with long-term current use of insulin ([place]) 01/16/2022   Dyslipidemia 01/16/2022   Chronic diastolic CHF (congestive heart failure) ([place]) 01/16/2022   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    Intracranial hemorrhage (HCC)    HFrEF (heart failure with reduced ejection fraction) ([place])    Hypertensive emergency 11/22/2021   Obesity, Class III, BMI 40-49.9 (morbid obesity) ([place]) 05/22/2020   Cough    Other cirrhosis of liver (HCC)    Ventral hernia without obstruction or gangrene    Acute CHF (congestive heart failure) ([place]) 02/20/2020   Shortness of breath    Hypertensive urgency    Neck pain    Prostate cancer screening 01/21/2020   Lymphedema of both lower extremities 10/17/2019   History of stroke 10/17/2019   CKD (chronic kidney disease) stage 3, GFR 30-59 ml/min (HCC) 08/31/2019   Swelling of limb 08/31/2019   DM (diabetes mellitus), type 2 with complications ([place]) [id number]  Acute kidney injury superimposed on CKD ([place]) 07/08/2019   Chest pain 07/08/2019   Vitreous floaters of both eyes 06/03/2018   Moderate obstructive sleep apnea 02/11/2018   Chronic fatigue 12/16/2017   Essential hypertension 12/16/2017   History of small bowel obstruction 10/24/2017   Adjustment disorder with depressed mood 09/30/2017   Stroke ([place]) 09/14/2017   Acute on chronic heart failure with preserved ejection fraction (HFpEF) (HCC)    Hyperlipidemia    Gout    Elevated LFTs    Heart failure with preserved ejection fraction ([place])    Morbid obesity ([place])    Hypoalbuminemia 07/11/2017    Type 2 diabetes mellitus with hyperglycemia ([place]) 07/10/2017    ONSET DATE: Aug 2023  REFERRING DIAG: L sided weakness  THERAPY DIAG:  Muscle weakness (generalized)  Other lack of coordination  Rationale for Evaluation and Treatment: Rehabilitation  SUBJECTIVE: Pt endorses residual weakness in the L arm from CVA in Aug of 2023.  SUBJECTIVE STATEMENT: Pt reports doing well today.    PERTINENT HISTORY: Pt reports 2-3 CVAs in Aug of 2023, but a total of 5 CVAs over the course of 4 years.  After the strokes in Aug, pt participated in [facility] therapy for 2-3 months.  Pt reports that he had been able to return to work up until 05/20/22.  Pt reported that his work felt like he was exhibiting stroke symptoms, so he went to the ED.  Per chart from ED visit on 05/20/22:     Clinical Course as of 05/20/22 1559  Mon May 20, 2022  1505 Prior CVA with soft blood pressures - reactivation of old CVA.  CTA negative. Code stroke and if MRI negative then will dc home.  [SM]     Clinical Course User Index [SM] [name], MD  MRI negative.  Patient states that he is feeling better.  Blood pressure improved while in the emergency department.  Last blood pressure [id number] systolic.  Patient discharged home in stable condition with discussion to follow-up as an outpatient and return precautions given.   [name], MD 05/20/22 1733     PRECAUTIONS: fall  WEIGHT BEARING RESTRICTIONS: No  PAIN:  Are you having pain? Yes: NPRS scale: 7-8/10 Pain location: R shoulder (arthritic) Pain description: achy Aggravating factors: cold weather, walking, stairs Relieving factors: rest, elevation  FALLS: Has patient fallen in last 6 months? Yes. 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  PATIENT GOALS: To get the L side stronger and return to work.   OBJECTIVE:   HAND DOMINANCE: Right  ADLs: Overall ADLs: modified indep with primarily use of the R arm. Transfers/ambulation related to ADLs: uses the walker outside, indoors no walker Eating: sister cuts food  Grooming: indep UB Dressing: extra time to manage clothing fasteners LB Dressing: slip on shoes; tucks laces into shoes Toileting: indep Bathing: modified indep Tub Shower transfers: walk in shower Equipment: see above  IADLs: Shopping: pt can accompany sister and pt pushes the cart Light housekeeping: shared task between pt and  sister  Meal Prep: some difficulty opening new packages/containers but can manage simple light meal prep/microwavable meals  Community mobility: able to drive, limited ambulator d/t obesity, arthritic knees, and L sided weakness Medication management: sister sets up pills into weekly pill organizer  Financial management: pt manages  Handwriting: No issues (pt is R hand dominant)  POSTURE COMMENTS:  rounded shoulders  ACTIVITY TOLERANCE: Activity tolerance: fair; limited community ambulator  FUNCTIONAL OUTCOME MEASURES: FOTO: 52; predicted 55  UPPER EXTREMITY ROM:    Active ROM Right Eval WNL Left eval  Shoulder flexion  98 (limited by pain)  Shoulder abduction  102  Shoulder adduction    Shoulder extension    Shoulder internal rotation    Shoulder external rotation    Elbow flexion    Elbow extension    Wrist flexion    Wrist extension    Wrist ulnar deviation    Wrist radial deviation    Wrist pronation    Wrist supination    (Blank rows = not tested)  UPPER EXTREMITY MMT:     MMT Right eval Left eval  Shoulder flexion 4+  NT (pain)  Shoulder abduction 4 NT (pain)  Shoulder adduction    Shoulder extension    Shoulder internal rotation    Shoulder external rotation    Middle trapezius    Lower trapezius    Elbow flexion 5 4-  Elbow extension 5 4-  Wrist flexion 5 3+  Wrist extension 5 3+  Wrist ulnar deviation    Wrist radial deviation    Wrist pronation    Wrist supination    (Blank rows = not tested)  HAND FUNCTION: Grip strength: Right: 71 lbs; Left: 27 lbs, Lateral pinch: Right: 16 lbs, Left: 6 lbs, and 3 point pinch: Right: 18 lbs, Left: 8 lbs  COORDINATION: Finger Nose Finger test: L sided good accuracy but slower  9 Hole Peg test: Right: 38 sec; Left: 46 sec Moderate difficulty opposing L hand digits to thumb   SENSATION: Light touch: Impaired , pt reports some L hand numbness ulnar nerve distribution volar and dorsal aspect, does not go up the arm  EDEMA: none  MUSCLE TONE: normal  COGNITION: Overall cognitive status: WFL for tasks assessed  VISION: Subjective report: 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, wears reading glasses all the time and reports increased blurred vision in the R eye since strokes in Aug of 2023  VISION ASSESSMENT: Tracking/Visual pursuits: impaired Saccades: impaired Visual Fields: pt limited in all visual fields of R eye (chart notes floaters both eyes)  Patient has difficulty with following activities due to following visual impairments: pt does not drive  PERCEPTION: WFL  PRAXIS: Impaired: Motor planning  TODAY'S TREATMENT:                                                                                                                              Therapeutic Exercise: Facilitated L  grip strengthening with hand gripper set at moderate resistance with 1 green band to complete 3 sets 10 reps beginning of session, and another 2 sets 10 reps mid session.  Rest between sets and cues for technique to maximize hand closure with each squeeze.   Facilitated L forearm, wrist, and hand strengthening with participation in [place] board tools.  Pt worked with long handled tool to facilitate L wrist flex/ext and forearm pron/sup, large base key turn, large dial turn, and small dial to facilitate L hand digit flex/ext x3 reps for each tool (up/down board=1 rep).  Rest breaks between sets and intermittent min guard to maintain tools level on wide velcro resistance strip.  Pt required transition from wide to narrow velcro strip for use of small dial to perform digit flex/ext as wide velcro was too much resistance.  Facilitated active L shoulder flexion performing "dowel climbs," alternating hands to climb vertical dowel on table top x5 reps.  Performed bilat shoulder ER to top of head and L shoulder abd x10 reps each with 1# dowel, min vc for form and technique.  Neuromuscular re-education: Facilitated L hand FMC/dexterity skills working to pick up and place small pegs into pegboard.  Pt practiced removing a row of 10 pegs, storing in hand, then discarding pegs 1 at a time from palm.     PATIENT EDUCATION: Education details: LUE strengthening and coordination training Person educated: Patient Education method: Explanation and Verbal cues Education comprehension: verbalized understanding  HOME EXERCISE PROGRAM: Theraputty  GOALS: Goals reviewed with patient? Yes  SHORT TERM GOALS: Target date: 07/11/22 (6 weeks)  Pt will be indep to perform HEP for increasing strength and coordination throughout the LUE. Baseline: Eval: issued putty exercises Goal status: INITIAL   LONG TERM GOALS: Target date: 08/22/22 (12 weeks)  Pt will increase FOTO score to 55 or better to indicate clinically relevant improvement in self perceived use of L arm to perform daily tasks.  Baseline: Eval: 52 Goal status: INITIAL  2.  Pt will increase L grip strength by 10 or more lbs in order to hold and carry ADL supplies in L hand. Baseline: Eval: L grip 27 lbs (R 71  lbs) Goal status: INITIAL  3.  Pt will increase LUE strength at the elbow and wrist in order to carry a heavy laundry basket with BUEs. Baseline: L elbow flex 4-, wrist flex 3+ Goal status: INITIAL  4.  Pt will increase L lateral pinch strength in order to ease ability to open small bottles and food packages.  Baseline: L 6 lbs (R 16 lbs) Goal status: INITIAL  5.  Pt will increase L FMC/dexterity skills to ease ability to manipulate small ADL supplies.  Baseline: L 9 hole 48 sec (R 36 sec); extra time to manage clothing fasteners and to pick up pills Goal status: INITIAL   ASSESSMENT: CLINICAL IMPRESSION: Good tolerance to all coordination and strengthening activities this date.  Pt was able to progress to dowel climb above shoulder level for shoulder flexion after several reps with good tolerance.  Pt. continues to present with limited UE strength, and [facility] skills. Pt. Continues to benefit form OT services to work on improving LUE functioning, and maximizing independence with ADLs, and IADLs.  PERFORMANCE DEFICITS: in functional skills including ADLs, IADLs, coordination, dexterity, sensation, strength, pain, Fine motor control, Gross motor control, mobility, balance, body mechanics, endurance, decreased knowledge of use of DME, vision, and UE functional use, and psychosocial skills including coping strategies, environmental adaptation, habits,  and routines and behaviors.   IMPAIRMENTS: are limiting patient from ADLs and IADLs.   CO-MORBIDITIES: has co-morbidities such as legally blind L eye, obesity, arthritis  that affects occupational performance. Patient will benefit from skilled OT to address above impairments and improve overall function.  MODIFICATION OR ASSISTANCE TO COMPLETE EVALUATION: No modification of tasks or assist necessary to complete an evaluation.  OT OCCUPATIONAL PROFILE AND HISTORY: Problem focused assessment: Including review of records relating to presenting  problem.  CLINICAL DECISION MAKING: Moderate - several treatment options, min-mod task modification necessary  REHAB POTENTIAL: Good  EVALUATION COMPLEXITY: Moderate    PLAN:  OT FREQUENCY: 2x/week  OT DURATION: 12 weeks  PLANNED INTERVENTIONS: self care/ADL training, therapeutic exercise, therapeutic activity, neuromuscular re-education, manual therapy, passive range of motion, balance training, functional mobility training, moist heat, cryotherapy, contrast bath, patient/family education, energy conservation, coping strategies training, and DME and/or AE instructions  RECOMMENDED OTHER SERVICES: None at this time  CONSULTED AND AGREED WITH PLAN OF CARE: Patient  PLAN FOR NEXT SESSION: see above  Leta Speller, MS, OTR/L

## 2022-06-18 ENCOUNTER — Ambulatory Visit: Payer: BC Managed Care – PPO

## 2022-06-18 ENCOUNTER — Ambulatory Visit: Payer: BC Managed Care – PPO | Admitting: Physical Therapy

## 2022-06-18 ENCOUNTER — Encounter: Payer: Self-pay | Admitting: Physical Therapy

## 2022-06-18 DIAGNOSIS — R278 Other lack of coordination: Secondary | ICD-10-CM

## 2022-06-18 DIAGNOSIS — M6281 Muscle weakness (generalized): Secondary | ICD-10-CM

## 2022-06-18 DIAGNOSIS — R2681 Unsteadiness on feet: Secondary | ICD-10-CM

## 2022-06-18 DIAGNOSIS — R262 Difficulty in walking, not elsewhere classified: Secondary | ICD-10-CM

## 2022-06-18 NOTE — Therapy (Signed)
OUTPATIENT PHYSICAL THERAPY NEURO TREATMENT   Patient Name: Jeremiah Vance MRN: [id number] DOB:1971/03/05, 52 y.o., male Today's Date: 06/18/2022   PCP: [name] PA REFERRING PROVIDER: [name] PA  END OF SESSION:  PT End of Session - 06/18/22 1040     Visit Number 5    Number of Visits 24    Date for PT Re-Evaluation 08/22/22    Progress Note Due on Visit 10    PT Start Time [id number]    PT Stop Time [id number]    PT Time Calculation (min) 43 min    Equipment Utilized During Treatment Gait belt    Activity Tolerance Patient tolerated treatment well;Patient limited by pain    Behavior During Therapy [facility] for tasks assessed/performed                 Past Medical History:  Diagnosis Date   Acute ischemic stroke ([place]) 09/06/2017   Acute renal failure superimposed on stage 3a chronic kidney disease ([place]) 07/08/2019   Acute right-sided weakness    Allergies    Anasarca    Anemia 07/10/2017   Arthritis    CHF (congestive heart failure) ([place])    "coinsided w/kidney problems I was having 06/2017"   Chicken pox    CKD (chronic kidney disease) stage 3, GFR 30-59 ml/min (HCC) 06/2017   Depression    Elevated troponin 07/08/2019   High cholesterol    History of cardiomyopathy    LVEF 40 to 45% in April 2019 - subsequently normalized   Hyperbilirubinemia 07/10/2017   Hypertension    Ischemic stroke ([place])    Small left internal capsule infarct due to lacunar disease   Morbid obesity ([place])    Normocytic anemia 12/16/2017   Recurrent incisional hernia with incarceration s/p repair 10/22/2017 10/21/2017   Stroke ([place]) 08/2017   "right sided weakness since; getting stronger though" (10/22/2017)   Type 2 diabetes mellitus ([place])    Past Surgical History:  Procedure Laterality Date   ABDOMINAL HERNIA REPAIR  2008; 10/22/2017   "scope; OPEN REPAIR INCARCERATED VENTRAL HERNIA   HERNIA REPAIR     KNEE ARTHROSCOPY Right 1989   VENTRAL HERNIA REPAIR N/A 10/22/2017   Procedure: OPEN  REPAIR INCARCERATED VENTRAL HERNIA;  Surgeon: Excell Seltzer, MD;  Location: [facility];  Service: General;  Laterality: N/A;   Patient Active Problem List   Diagnosis Date Noted   Dysarthria 05/20/2022   Colon cancer screening 03/20/2022   Need for diphtheria-tetanus-pertussis (Tdap) vaccine 03/20/2022   Acute CVA (cerebrovascular accident) ([employer]) 01/16/2022   Type 2 diabetes mellitus without complication, with long-term current use of insulin ([place]) 01/16/2022   Dyslipidemia 01/16/2022   Chronic diastolic CHF (congestive heart failure) ([place]) 01/16/2022   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    Intracranial hemorrhage (HCC)    HFrEF (heart failure with reduced ejection fraction) ([place])    Hypertensive emergency 11/22/2021   Obesity, Class III, BMI 40-49.9 (morbid obesity) ([place]) 05/22/2020   Cough    Other cirrhosis of liver (HCC)    Ventral hernia without obstruction or gangrene    Acute CHF (congestive heart failure) ([place]) 02/20/2020   Shortness of breath    Hypertensive urgency    Neck pain    Prostate cancer screening 01/21/2020   Lymphedema of both lower extremities 10/17/2019   History of stroke 10/17/2019   CKD (chronic kidney disease) stage 3, GFR 30-59 ml/min (HCC) 08/31/2019   Swelling of limb 08/31/2019   DM (diabetes  mellitus), type 2 with complications ([place]) [id number]   Acute kidney injury superimposed on CKD ([place]) 07/08/2019   Chest pain 07/08/2019   Vitreous floaters of both eyes 06/03/2018   Moderate obstructive sleep apnea 02/11/2018   Chronic fatigue 12/16/2017   Essential hypertension 12/16/2017   History of small bowel obstruction 10/24/2017   Adjustment disorder with depressed mood 09/30/2017   Stroke ([place]) 09/14/2017   Acute on chronic heart failure with preserved ejection fraction (HFpEF) (HCC)    Hyperlipidemia    Gout    Elevated LFTs    Heart failure with preserved ejection fraction ([place])    Morbid obesity ([place])     Hypoalbuminemia 07/11/2017   Type 2 diabetes mellitus with hyperglycemia ([place]) 07/10/2017    ONSET DATE: 05/20/22  REFERRING DIAG: Left sided weakness   THERAPY DIAG:  Muscle weakness (generalized)  Unsteadiness on feet  Difficulty in walking, not elsewhere classified  Rationale for Evaluation and Treatment: Rehabilitation  SUBJECTIVE:                                                                                                                                                                                             SUBJECTIVE STATEMENT: Pt reports he had one fall when leaving church in the dark when a shaded curb tripped him up. He reports soreness in his R lower leg and his L hand from breaking his fall.  Pt accompanied by: self  PERTINENT HISTORY:   Patient presents for Left sided weakness. Patient presented to [facility] on 05/20/22 for L sided weakness and slurred speech.MRI negative.  PMH includes CVA (2019, 2023), obesity, Type II DM, HTN, HLD, lymphedema of BLE, Stage IIIa CKD, anemia, depression, hernia,arthritis, CHF. Patient had multiple strokes in  fall 2023 and received home health. 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 Patient has not worked the past two weeks due to another incident 2-3 weeks ago. Prior to stroke was independent, use a walker occasionally now.   PAIN:  Are you having pain? Yes: NPRS scale: 8/10 Pain location: R lower leg Pain description: aching Aggravating factors: Fall  Relieving factors: sitting down, keeping legs raised, hot shower  PRECAUTIONS: Fall  WEIGHT BEARING RESTRICTIONS: No  FALLS: Has patient fallen in last 6 months? Yes. Number of falls 1  LIVING ENVIRONMENT: Lives with: lives with their family Lives in: House/apartment Stairs: Yes: Internal: flight steps; on right going up Has following equipment at home: Gilford Rile - 2 wheeled  PLOF: Independent  PATIENT GOALS: to get stronger  OBJECTIVE:   DIAGNOSTIC FINDINGS:    MRI: IMPRESSION: 1. No evidence of acute abnormality.  2. Prior hemorrhages, prior lacunar infarcts, and prior chronic microvascular ischemic disease which is advanced for patient age. Findings could be in part secondary to chronic hypertension.  CTA:   1. No emergent large vessel occlusion. 2. 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 3. Multifocal irregularity and moderate narrowing of the right vertebral artery, mildly progressed.  COGNITION: Overall cognitive status:  short term memory issues s/p stroke.    SENSATION: Light touch: Impaired LLE  COORDINATION: LLE coordination impaired with delay and limited coordination.   EDEMA:  Bilateral lymphedema of LE's   POSTURE: rounded shoulders, forward head, and flexed trunk    LOWER EXTREMITY MMT:    MMT Right Eval Left Eval  Hip flexion 3 3  Hip extension    Hip abduction 4- 3+  Hip adduction 4- 3+  Hip internal rotation    Hip external rotation    Knee flexion 3+ 3  Knee extension 3+ 3  Ankle dorsiflexion 4- 3-  Ankle plantarflexion 4- 3+  Ankle inversion    Ankle eversion    (Blank rows = not tested)   TRANSFERS: Assistive device utilized: None  Sit to stand: CGA Stand to sit: CGA Chair to chair: CGA    GAIT: Gait pattern: step through pattern, decreased stride length, antalgic, wide BOS, poor foot clearance- Right, and poor foot clearance- Left Distance walked: 40 ft Assistive device utilized: None Level of assistance: CGA Comments: Patient is unsteady without AD, has antalgic gait pattern with R knee however has limited strength for weightbearing on LLE.   FUNCTIONAL TESTS:  5 times sit to stand: 41 seconds  10 meter walk test: 21 seconds increased knee pain 6/10 Berg Balance Scale: 14/56  PATIENT SURVEYS:  FOTO 44  TODAY'S TREATMENT:                                                                                                                              DATE:  06/18/22  Unless otherwise stated, CGA was provided and gait belt donned in order to ensure pt safety  TE Nustep 2 x 2.5 min intervals at level 1 - 1 min rest intervals die to fatigue, seat level 10 and B UE use  -some c/o knee discomfort   REST  Neuro Re-ed Standing with CGA next to support surface: Requires intermittent UE support   Airex pad: static stand 30 seconds x 1 trial with good stability increased challenge on second repetition by adding ball taps laterally to wall and to PT hand and pt reports higher difficulty   NBOS 2 x 30  TherEx:  -Hip extension with B upper extremity support, cueing for neutral hip alignment, upright posture for optimal muscle recruitment, and sequencing, 10x each LE  Seated with # 3 ankle weights  -Seated marches with lateral step over 2 sets x 12 ea LE   -Seated LAQ with 3 second holds, 10x each LE, cueing for muscle activation and sequencing for neutral  alignment -heel raise 15x2 sets each LE   Adduction ball squeeze 10x 2 sets    Pt required occasional rest breaks due fatigue, PT was quick to ask when pt appeared to be fatiguing in order to prevent excessive fatigue.    PATIENT EDUCATION: Education details: goals, POC, HEP Person educated: Patient Education method: Explanation, Demonstration, Tactile cues, and Verbal cues Education comprehension: verbalized understanding, returned demonstration, verbal cues required, and tactile cues required  HOME EXERCISE PROGRAM: Access Code: [email or web address] URL: [email or web address] Date: 05/30/2022 Prepared by: Janna Arch  Exercises - Leg Extension  - 1 x daily - 7 x weekly - 2 sets - 10 reps - 5 hold - Seated March  - 1 x daily - 7 x weekly - 2 sets - 10 reps - 5 hold - Seated Ankle Circles  - 1 x daily - 7 x weekly - 2 sets - 10 reps - 5 hold  GOALS: Goals reviewed with patient? Yes  SHORT TERM GOALS: Target date: 06/27/2022    Patient will be independent in home exercise  program to improve strength/mobility for better functional independence with ADLs. Baseline: 2/29: HEP given Goal status: INITIAL   LONG TERM GOALS: Target date: 08/22/2022    Patient will increase FOTO score to equal to or greater than  54%   to demonstrate statistically significant improvement in mobility and quality of life.  Baseline: 2/29: 44%  Goal status: INITIAL  2.  Patient (< 61 years old) will complete five times sit to stand test in < 10 seconds indicating an increased LE strength and improved balance. Baseline: 2/29: 41 seconds with significant UE support Goal status: INITIAL  3.  Patient will increase Berg Balance score by > 10 points (24/56) to demonstrate decreased fall risk during functional activities. Baseline: 2/29: 14/56 Goal status: INITIAL  4.  Patient will increase 10 meter walk test to >1.42m/s as to improve gait speed for better community ambulation and to reduce fall risk. Baseline: 2/29: 21 seconds increased knee pain Goal status: INITIAL    ASSESSMENT:  CLINICAL IMPRESSION:  Pt presents with good motivation for completion of PT activities. Pt requires frequent rest breaks, particularly when challenged with standing activities but demonstrates improvement this date compared to previous sessions and pt better tolerating prolonged exercises at this time.   Pt will continue to benefit from skilled physical therapy intervention to address impairments, improve QOL, and attain therapy goals.    OBJECTIVE IMPAIRMENTS: Abnormal gait, cardiopulmonary status limiting activity, decreased activity tolerance, decreased balance, decreased coordination, decreased endurance, decreased mobility, difficulty walking, decreased strength, increased edema, impaired perceived functional ability, impaired flexibility, impaired sensation, impaired UE functional use, impaired vision/preception, improper body mechanics, postural dysfunction, obesity, and pain.   ACTIVITY  LIMITATIONS: carrying, lifting, bending, sitting, standing, squatting, stairs, transfers, bed mobility, bathing, toileting, dressing, reach over head, hygiene/grooming, locomotion level, and caring for others  PARTICIPATION LIMITATIONS: meal prep, cleaning, laundry, personal finances, driving, shopping, community activity, occupation, yard work, school, and church  PERSONAL FACTORS: Age, Behavior pattern, Education, Restaurant manager, fast food, Past/current experiences, Profession, Social background, Time since onset of injury/illness/exacerbation, Transportation, and 3+ comorbidities: CVA (2019, 2023), obesity, Type II DM, HTN, HLD, lymphedema of BLE, Stage IIIa CKD, anemia, depression, hernia,arthritis, CHF  are also affecting patient's functional outcome.   REHAB POTENTIAL: Good  CLINICAL DECISION MAKING: Evolving/moderate complexity  EVALUATION COMPLEXITY: Moderate  PLAN:  PT FREQUENCY: 2x/week  PT DURATION: 12 weeks  PLANNED INTERVENTIONS: Therapeutic exercises, Therapeutic activity, Neuromuscular re-education, Balance training, Gait training,  Patient/Family education, Self Care, Joint mobilization, Joint manipulation, Stair training, Vestibular training, Canalith repositioning, Visual/preceptual remediation/compensation, Orthotic/Fit training, DME instructions, Cognitive remediation, Electrical stimulation, Spinal mobilization, Cryotherapy, Manual lymph drainage, Compression bandaging, Taping, Vasopneumatic device, Traction, Ultrasound, Ionotophoresis 4mg /ml Dexamethasone, Manual therapy, and Re-evaluation  PLAN FOR NEXT SESSION: balance, strength, ambulatory endurance training    Particia Lather, PT 06/18/2022, 10:41 AM

## 2022-06-19 NOTE — Therapy (Signed)
OUTPATIENT PHYSICAL THERAPY NEURO TREATMENT   Patient Name: Jeremiah Vance MRN: [id number] DOB:November 14, 1970, 52 y.o., male Today's Date: 06/20/2022   PCP: [name] PA REFERRING PROVIDER: [name] PA  END OF SESSION:  PT End of Session - 06/20/22 0834     Visit Number 6    Number of Visits 24    Date for PT Re-Evaluation 08/22/22    Progress Note Due on Visit 10    PT Start Time [id number]    PT Stop Time 1014    PT Time Calculation (min) 43 min    Equipment Utilized During Treatment Gait belt    Activity Tolerance Patient tolerated treatment well;Patient limited by pain    Behavior During Therapy [facility] for tasks assessed/performed                  Past Medical History:  Diagnosis Date   Acute ischemic stroke ([place]) 09/06/2017   Acute renal failure superimposed on stage 3a chronic kidney disease ([place]) 07/08/2019   Acute right-sided weakness    Allergies    Anasarca    Anemia 07/10/2017   Arthritis    CHF (congestive heart failure) ([place])    "coinsided w/kidney problems I was having 06/2017"   Chicken pox    CKD (chronic kidney disease) stage 3, GFR 30-59 ml/min (HCC) 06/2017   Depression    Elevated troponin 07/08/2019   High cholesterol    History of cardiomyopathy    LVEF 40 to 45% in April 2019 - subsequently normalized   Hyperbilirubinemia 07/10/2017   Hypertension    Ischemic stroke ([place])    Small left internal capsule infarct due to lacunar disease   Morbid obesity ([place])    Normocytic anemia 12/16/2017   Recurrent incisional hernia with incarceration s/p repair 10/22/2017 10/21/2017   Stroke ([place]) 08/2017   "right sided weakness since; getting stronger though" (10/22/2017)   Type 2 diabetes mellitus ([place])    Past Surgical History:  Procedure Laterality Date   ABDOMINAL HERNIA REPAIR  2008; 10/22/2017   "scope; OPEN REPAIR INCARCERATED VENTRAL HERNIA   HERNIA REPAIR     KNEE ARTHROSCOPY Right 1989   VENTRAL HERNIA REPAIR N/A 10/22/2017   Procedure: OPEN  REPAIR INCARCERATED VENTRAL HERNIA;  Surgeon: Excell Seltzer, MD;  Location: [place];  Service: General;  Laterality: N/A;   Patient Active Problem List   Diagnosis Date Noted   Dysarthria 05/20/2022   Colon cancer screening 03/20/2022   Need for diphtheria-tetanus-pertussis (Tdap) vaccine 03/20/2022   Acute CVA (cerebrovascular accident) ([place]) 01/16/2022   Type 2 diabetes mellitus without complication, with long-term current use of insulin ([place]) 01/16/2022   Dyslipidemia 01/16/2022   Chronic diastolic CHF (congestive heart failure) ([place]) 01/16/2022   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    Intracranial hemorrhage (HCC)    HFrEF (heart failure with reduced ejection fraction) ([place])    Hypertensive emergency 11/22/2021   Obesity, Class III, BMI 40-49.9 (morbid obesity) ([place]) 05/22/2020   Cough    Other cirrhosis of liver (HCC)    Ventral hernia without obstruction or gangrene    Acute CHF (congestive heart failure) ([place]) 02/20/2020   Shortness of breath    Hypertensive urgency    Neck pain    Prostate cancer screening 01/21/2020   Lymphedema of both lower extremities 10/17/2019   History of stroke 10/17/2019   CKD (chronic kidney disease) stage 3, GFR 30-59 ml/min (HCC) 08/31/2019   Swelling of limb 08/31/2019   DM (  diabetes mellitus), type 2 with complications ([place]) [id number]   Acute kidney injury superimposed on CKD ([place]) 07/08/2019   Chest pain 07/08/2019   Vitreous floaters of both eyes 06/03/2018   Moderate obstructive sleep apnea 02/11/2018   Chronic fatigue 12/16/2017   Essential hypertension 12/16/2017   History of small bowel obstruction 10/24/2017   Adjustment disorder with depressed mood 09/30/2017   Stroke ([place]) 09/14/2017   Acute on chronic heart failure with preserved ejection fraction (HFpEF) (HCC)    Hyperlipidemia    Gout    Elevated LFTs    Heart failure with preserved ejection fraction ([place])    Morbid obesity ([place])     Hypoalbuminemia 07/11/2017   Type 2 diabetes mellitus with hyperglycemia ([place]) 07/10/2017    ONSET DATE: 05/20/22  REFERRING DIAG: Left sided weakness   THERAPY DIAG:  Muscle weakness (generalized)  Unsteadiness on feet  Difficulty in walking, not elsewhere classified  Rationale for Evaluation and Treatment: Rehabilitation  SUBJECTIVE:                                                                                                                                                                                             SUBJECTIVE STATEMENT: Patient reports feeling sore from his fall last week. Patient had OT prior to PT  Pt accompanied by: self  PERTINENT HISTORY:   Patient presents for Left sided weakness. Patient presented to [facility] on 05/20/22 for L sided weakness and slurred speech.MRI negative.  PMH includes CVA (2019, 2023), obesity, Type II DM, HTN, HLD, lymphedema of BLE, Stage IIIa CKD, anemia, depression, hernia,arthritis, CHF. Patient had multiple strokes in  fall 2023 and received home health. 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 Patient has not worked the past two weeks due to another incident 2-3 weeks ago. Prior to stroke was independent, use a walker occasionally now.   PAIN:  Are you having pain? Yes: NPRS scale: 8/10 Pain location: R lower leg Pain description: aching Aggravating factors: Fall  Relieving factors: sitting down, keeping legs raised, hot shower  PRECAUTIONS: Fall  WEIGHT BEARING RESTRICTIONS: No  FALLS: Has patient fallen in last 6 months? Yes. Number of falls 1  LIVING ENVIRONMENT: Lives with: lives with their family Lives in: House/apartment Stairs: Yes: Internal: flight steps; on right going up Has following equipment at home: Gilford Rile - 2 wheeled  PLOF: Independent  PATIENT GOALS: to get stronger  OBJECTIVE:   DIAGNOSTIC FINDINGS:   MRI: IMPRESSION: 1. No evidence of acute abnormality. 2. Prior hemorrhages, prior lacunar  infarcts, and prior chronic microvascular ischemic disease which is advanced for patient age. Findings  could be in part secondary to chronic hypertension.  CTA:   1. No emergent large vessel occlusion. 2. 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 3. Multifocal irregularity and moderate narrowing of the right vertebral artery, mildly progressed.  COGNITION: Overall cognitive status:  short term memory issues s/p stroke.    SENSATION: Light touch: Impaired LLE  COORDINATION: LLE coordination impaired with delay and limited coordination.   EDEMA:  Bilateral lymphedema of LE's   POSTURE: rounded shoulders, forward head, and flexed trunk    LOWER EXTREMITY MMT:    MMT Right Eval Left Eval  Hip flexion 3 3  Hip extension    Hip abduction 4- 3+  Hip adduction 4- 3+  Hip internal rotation    Hip external rotation    Knee flexion 3+ 3  Knee extension 3+ 3  Ankle dorsiflexion 4- 3-  Ankle plantarflexion 4- 3+  Ankle inversion    Ankle eversion    (Blank rows = not tested)   TRANSFERS: Assistive device utilized: None  Sit to stand: CGA Stand to sit: CGA Chair to chair: CGA    GAIT: Gait pattern: step through pattern, decreased stride length, antalgic, wide BOS, poor foot clearance- Right, and poor foot clearance- Left Distance walked: 40 ft Assistive device utilized: None Level of assistance: CGA Comments: Patient is unsteady without AD, has antalgic gait pattern with R knee however has limited strength for weightbearing on LLE.   FUNCTIONAL TESTS:  5 times sit to stand: 41 seconds  10 meter walk test: 21 seconds increased knee pain 6/10 Berg Balance Scale: 14/56  PATIENT SURVEYS:  FOTO 44  TODAY'S TREATMENT:                                                                                                                              DATE: 06/20/22  Unless otherwise stated, CGA was provided and gait belt donned in order to ensure pt  safety  TE Nustep seat position 11; Level 2; x2 minutes x 2 trials with one rest break   Neuro Re-ed Standing with CGA next to support surface: Requires intermittent UE support   Standing with CGA next to support surface:  Airex pad: static stand 30 seconds x 2 trials, noticeable trembling of ankles/LE's with fatigue and challenge to maintain stability Airex pad: horizontal head turns 30 seconds scanning room 10x ; cueing for arc of motion ; increased dizziness with head turns.  Airex pad: vertical head turns 30 seconds, cueing for arc of motion, noticeable sway with upward gaze increasing demand on ankle righting reaction musculature Airex pad: one foot on 6" step one foot on airex pad, hold position for 30 seconds, switch legs, 2x each LE;  Step over half foam roller and back 10x each LE Side step over half foam roller 10x each side  TherEx:   -Seated LAQ with 3 second holds, 10x each LE, cueing for muscle activation and sequencing for neutral alignment -  heel raise 15x2 sets each LE  -glute squeeze 15x  -Adduction ball squeeze 15x -ankle circles 10x   Ambulate 200 ft to elevator with cues for safety awareness with CGA  Pt required occasional rest breaks due fatigue, PT was quick to ask when pt appeared to be fatiguing in order to prevent excessive fatigue.    PATIENT EDUCATION: Education details: goals, POC, HEP Person educated: Patient Education method: Explanation, Demonstration, Tactile cues, and Verbal cues Education comprehension: verbalized understanding, returned demonstration, verbal cues required, and tactile cues required  HOME EXERCISE PROGRAM: Access Code: [email or web address] URL: [email or web address] Date: 05/30/2022 Prepared by: Janna Arch  Exercises - Leg Extension  - 1 x daily - 7 x weekly - 2 sets - 10 reps - 5 hold - Seated March  - 1 x daily - 7 x weekly - 2 sets - 10 reps - 5 hold - Seated Ankle Circles  - 1 x daily - 7 x weekly - 2 sets - 10  reps - 5 hold  GOALS: Goals reviewed with patient? Yes  SHORT TERM GOALS: Target date: 06/27/2022    Patient will be independent in home exercise program to improve strength/mobility for better functional independence with ADLs. Baseline: 2/29: HEP given Goal status: INITIAL   LONG TERM GOALS: Target date: 08/22/2022    Patient will increase FOTO score to equal to or greater than  54%   to demonstrate statistically significant improvement in mobility and quality of life.  Baseline: 2/29: 44%  Goal status: INITIAL  2.  Patient (< 29 years old) will complete five times sit to stand test in < 10 seconds indicating an increased LE strength and improved balance. Baseline: 2/29: 41 seconds with significant UE support Goal status: INITIAL  3.  Patient will increase Berg Balance score by > 10 points (24/56) to demonstrate decreased fall risk during functional activities. Baseline: 2/29: 14/56 Goal status: INITIAL  4.  Patient will increase 10 meter walk test to >1.57m/s as to improve gait speed for better community ambulation and to reduce fall risk. Baseline: 2/29: 21 seconds increased knee pain Goal status: INITIAL    ASSESSMENT:  CLINICAL IMPRESSION:  Patient presents with excellent motivation. He requires additional rest breaks due to fatigue and soreness from fall the week prior. Patient walked down to PT for session however required chair to return back to car.  Patient's knee pain limits standing duration. Pt will continue to benefit from skilled physical therapy intervention to address impairments, improve QOL, and attain therapy goals.    OBJECTIVE IMPAIRMENTS: Abnormal gait, cardiopulmonary status limiting activity, decreased activity tolerance, decreased balance, decreased coordination, decreased endurance, decreased mobility, difficulty walking, decreased strength, increased edema, impaired perceived functional ability, impaired flexibility, impaired sensation, impaired UE  functional use, impaired vision/preception, improper body mechanics, postural dysfunction, obesity, and pain.   ACTIVITY LIMITATIONS: carrying, lifting, bending, sitting, standing, squatting, stairs, transfers, bed mobility, bathing, toileting, dressing, reach over head, hygiene/grooming, locomotion level, and caring for others  PARTICIPATION LIMITATIONS: meal prep, cleaning, laundry, personal finances, driving, shopping, community activity, occupation, yard work, school, and church  PERSONAL FACTORS: Age, Behavior pattern, Education, Restaurant manager, fast food, Past/current experiences, Profession, Social background, Time since onset of injury/illness/exacerbation, Transportation, and 3+ comorbidities: CVA (2019, 2023), obesity, Type II DM, HTN, HLD, lymphedema of BLE, Stage IIIa CKD, anemia, depression, hernia,arthritis, CHF  are also affecting patient's functional outcome.   REHAB POTENTIAL: Good  CLINICAL DECISION MAKING: Evolving/moderate complexity  EVALUATION COMPLEXITY: Moderate  PLAN:  PT FREQUENCY: 2x/week  PT  DURATION: 12 weeks  PLANNED INTERVENTIONS: Therapeutic exercises, Therapeutic activity, Neuromuscular re-education, Balance training, Gait training, Patient/Family education, Self Care, Joint mobilization, Joint manipulation, Stair training, Vestibular training, Canalith repositioning, Visual/preceptual remediation/compensation, Orthotic/Fit training, DME instructions, Cognitive remediation, Electrical stimulation, Spinal mobilization, Cryotherapy, Manual lymph drainage, Compression bandaging, Taping, Vasopneumatic device, Traction, Ultrasound, Ionotophoresis 4mg /ml Dexamethasone, Manual therapy, and Re-evaluation  PLAN FOR NEXT SESSION: balance, strength, ambulatory endurance training    Janna Arch, PT 06/20/2022, 10:15 AM

## 2022-06-19 NOTE — Therapy (Signed)
OUTPATIENT OCCUPATIONAL THERAPY NEURO TREATMENT NOTE  Patient Name: Jeremiah Vance MRN: [id number] DOB:01-Nov-1970, 52 y.o., male Today's Date: 06/19/2022  PCP: [name], PA-C REFERRING PROVIDER: [name], PA-C   OT End of Session - 06/19/22 1319     Visit Number 4    Number of Visits 24    Date for OT Re-Evaluation 08/22/22    Progress Note Due on Visit 10    OT Start Time 1145    OT Stop Time 97    OT Time Calculation (min) 45 min    Equipment Utilized During Treatment transport chair    Activity Tolerance Patient tolerated treatment well    Behavior During Therapy [facility] for tasks assessed/performed               Past Medical History:  Diagnosis Date   Acute ischemic stroke ([place]) 09/06/2017   Acute renal failure superimposed on stage 3a chronic kidney disease ([place]) 07/08/2019   Acute right-sided weakness    Allergies    Anasarca    Anemia 07/10/2017   Arthritis    CHF (congestive heart failure) ([place])    "coinsided w/kidney problems I was having 06/2017"   Chicken pox    CKD (chronic kidney disease) stage 3, GFR 30-59 ml/min (HCC) 06/2017   Depression    Elevated troponin 07/08/2019   High cholesterol    History of cardiomyopathy    LVEF 40 to 45% in April 2019 - subsequently normalized   Hyperbilirubinemia 07/10/2017   Hypertension    Ischemic stroke ([place])    Small left internal capsule infarct due to lacunar disease   Morbid obesity ([place])    Normocytic anemia 12/16/2017   Recurrent incisional hernia with incarceration s/p repair 10/22/2017 10/21/2017   Stroke ([place]) 08/2017   "right sided weakness since; getting stronger though" (10/22/2017)   Type 2 diabetes mellitus ([place])    Past Surgical History:  Procedure Laterality Date   ABDOMINAL HERNIA REPAIR  2008; 10/22/2017   "scope; OPEN REPAIR INCARCERATED VENTRAL HERNIA   HERNIA REPAIR     KNEE ARTHROSCOPY Right 1989   VENTRAL HERNIA REPAIR N/A 10/22/2017   Procedure: OPEN REPAIR INCARCERATED VENTRAL  HERNIA;  Surgeon: Excell Seltzer, MD;  Location: [place];  Service: General;  Laterality: N/A;   Patient Active Problem List   Diagnosis Date Noted   Dysarthria 05/20/2022   Colon cancer screening 03/20/2022   Need for diphtheria-tetanus-pertussis (Tdap) vaccine 03/20/2022   Acute CVA (cerebrovascular accident) ([place]) 01/16/2022   Type 2 diabetes mellitus without complication, with long-term current use of insulin ([place]) 01/16/2022   Dyslipidemia 01/16/2022   Chronic diastolic CHF (congestive heart failure) ([place]) 01/16/2022   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    Intracranial hemorrhage (HCC)    HFrEF (heart failure with reduced ejection fraction) ([place])    Hypertensive emergency 11/22/2021   Obesity, Class III, BMI 40-49.9 (morbid obesity) ([place]) 05/22/2020   Cough    Other cirrhosis of liver (HCC)    Ventral hernia without obstruction or gangrene    Acute CHF (congestive heart failure) ([place]) 02/20/2020   Shortness of breath    Hypertensive urgency    Neck pain    Prostate cancer screening 01/21/2020   Lymphedema of both lower extremities 10/17/2019   History of stroke 10/17/2019   CKD (chronic kidney disease) stage 3, GFR 30-59 ml/min (HCC) 08/31/2019   Swelling of limb 08/31/2019   DM (diabetes mellitus), type 2 with complications ([place]) [id number]  Acute kidney injury superimposed on CKD ([place]) 07/08/2019   Chest pain 07/08/2019   Vitreous floaters of both eyes 06/03/2018   Moderate obstructive sleep apnea 02/11/2018   Chronic fatigue 12/16/2017   Essential hypertension 12/16/2017   History of small bowel obstruction 10/24/2017   Adjustment disorder with depressed mood 09/30/2017   Stroke ([place]) 09/14/2017   Acute on chronic heart failure with preserved ejection fraction (HFpEF) (HCC)    Hyperlipidemia    Gout    Elevated LFTs    Heart failure with preserved ejection fraction ([place])    Morbid obesity ([place])    Hypoalbuminemia 07/11/2017    Type 2 diabetes mellitus with hyperglycemia ([place]) 07/10/2017   ONSET DATE: Aug 2023  REFERRING DIAG: L sided weakness  THERAPY DIAG:  Muscle weakness (generalized)  Other lack of coordination  Rationale for Evaluation and Treatment: Rehabilitation  SUBJECTIVE: Pt endorses residual weakness in the L arm from CVA in Aug of 2023.  SUBJECTIVE STATEMENT: Pt reports he's sore all over from a fall that he had on Friday when it was dark and he tripped on a curb.  Pt reports he mostly scraped his legs but he's sore in the L shoulder as well.    PERTINENT HISTORY: Pt reports 2-3 CVAs in Aug of 2023, but a total of 5 CVAs over the course of 4 years.  After the strokes in Aug, pt participated in [facility] therapy for 2-3 months.  Pt reports that he had been able to return to work up until 05/20/22.  Pt reported that his work felt like he was exhibiting stroke symptoms, so he went to the ED.  Per chart from ED visit on 05/20/22:     Clinical Course as of 05/20/22 1559  Mon May 20, 2022  1505 Prior CVA with soft blood pressures - reactivation of old CVA.  CTA negative. Code stroke and if MRI negative then will dc home.  [SM]     Clinical Course User Index [SM] [name], MD  MRI negative.  Patient states that he is feeling better.  Blood pressure improved while in the emergency department.  Last blood pressure [id number] systolic.  Patient discharged home in stable condition with discussion to follow-up as an outpatient and return precautions given.   [name], MD 05/20/22 1733     PRECAUTIONS: fall  WEIGHT BEARING RESTRICTIONS: No  PAIN:  Are you having pain? Yes: NPRS scale: 7-8/10 Pain location: R shoulder (arthritic) Pain description: achy Aggravating factors: cold weather, walking, stairs Relieving factors: rest, elevation  FALLS: Has patient fallen in last 6 months? Yes. 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  PATIENT GOALS: To get the L side stronger and return to work.   OBJECTIVE:   HAND DOMINANCE: Right  ADLs: Overall ADLs: modified indep with primarily use of the R arm. Transfers/ambulation related to ADLs: uses the walker outside, indoors no walker Eating: sister cuts food  Grooming: indep UB Dressing: extra time to manage clothing fasteners LB Dressing: slip on shoes; tucks laces  into shoes Toileting: indep Bathing: modified indep Tub Shower transfers: walk in shower Equipment: see above  IADLs: Shopping: pt can accompany sister and pt pushes the cart Light housekeeping: shared task between pt and sister  Meal Prep: some difficulty opening new packages/containers but can manage simple light meal prep/microwavable meals  Community mobility: able to drive, limited ambulator d/t obesity, arthritic knees, and L sided weakness Medication management: sister sets up pills into weekly pill organizer  Financial management: pt manages  Handwriting: No issues (pt is R hand dominant)  POSTURE COMMENTS:  rounded shoulders  ACTIVITY TOLERANCE: Activity tolerance: fair; limited community ambulator  FUNCTIONAL OUTCOME MEASURES: FOTO: 52; predicted 55  UPPER EXTREMITY ROM:    Active ROM Right Eval WNL Left eval  Shoulder flexion  98 (limited by pain)  Shoulder abduction  102  Shoulder adduction    Shoulder extension    Shoulder internal rotation    Shoulder external rotation    Elbow flexion    Elbow extension    Wrist flexion    Wrist extension    Wrist ulnar deviation    Wrist radial  deviation    Wrist pronation    Wrist supination    (Blank rows = not tested)  UPPER EXTREMITY MMT:     MMT Right eval Left eval  Shoulder flexion 4+ NT (pain)  Shoulder abduction 4 NT (pain)  Shoulder adduction    Shoulder extension    Shoulder internal rotation    Shoulder external rotation    Middle trapezius    Lower trapezius    Elbow flexion 5 4-  Elbow extension 5 4-  Wrist flexion 5 3+  Wrist extension 5 3+  Wrist ulnar deviation    Wrist radial deviation    Wrist pronation    Wrist supination    (Blank rows = not tested)  HAND FUNCTION: Grip strength: Right: 71 lbs; Left: 27 lbs, Lateral pinch: Right: 16 lbs, Left: 6 lbs, and 3 point pinch: Right: 18 lbs, Left: 8 lbs  COORDINATION: Finger Nose Finger test: L sided good accuracy but slower  9 Hole Peg test: Right: 38 sec; Left: 46 sec Moderate difficulty opposing L hand digits to thumb   SENSATION: Light touch: Impaired , pt reports some L hand numbness ulnar nerve distribution volar and dorsal aspect, does not go up the arm  EDEMA: none  MUSCLE TONE: normal  COGNITION: Overall cognitive status: WFL for tasks assessed  VISION: Subjective report: 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, wears reading glasses all the time and reports increased blurred vision in the R eye since strokes in Aug of 2023  VISION ASSESSMENT: Tracking/Visual pursuits: impaired Saccades: impaired Visual Fields: pt limited in all visual fields of R eye (chart notes floaters both eyes)  Patient has difficulty with following activities due to following visual impairments: pt does not drive  PERCEPTION: WFL  PRAXIS: Impaired: Motor planning  TODAY'S TREATMENT:  Therapeutic Exercise: Facilitated L grip strengthening with hand gripper set at moderate resistance with 1 blue band to complete 3 sets 10  reps beginning of session, and another 2 sets 10 reps mid session.  Rest between sets and cues for technique to maximize hand closure with each squeeze.  Facilitated L forearm, wrist, and hand strengthening with participation in [place] board tools.  Pt worked with long handled tool to facilitate L wrist flex/ext and forearm pron/sup, large base key turn, large dial turn, and small dial to facilitate L hand digit flex/ext x3 reps for each tool (up/down board=1 rep).  Rest breaks between sets and intermittent min guard to maintain tools level on wide velcro resistance strip. Facilitated pinch strengthening with use of therapy resistant clothespins to target lateral and 3 point pinch of L hand.  Pt tolerated yellow, red, and green clips, clipping onto vertical dowel.    Neuromuscular re-education: Facilitated L hand FMC/dexterity skills working to pick up and place small pegs into pegboard.  Pt practiced removing a row of 10 pegs, storing in hand, then discarding pegs 1 at a time from palm.     PATIENT EDUCATION: Education details: LUE strengthening and coordination training Person educated: Patient Education method: Explanation and Verbal cues Education comprehension: verbalized understanding  HOME EXERCISE PROGRAM: Theraputty  GOALS: Goals reviewed with patient? Yes  SHORT TERM GOALS: Target date: 07/11/22 (6 weeks)  Pt will be indep to perform HEP for increasing strength and coordination throughout the LUE. Baseline: Eval: issued putty exercises Goal status: INITIAL   LONG TERM GOALS: Target date: 08/22/22 (12 weeks)  Pt will increase FOTO score to 55 or better to indicate clinically relevant improvement in self perceived use of L arm to perform daily tasks.  Baseline: Eval: 52 Goal status: INITIAL  2.  Pt will increase L grip strength by 10 or more lbs in order to hold and carry ADL supplies in L hand. Baseline: Eval: L grip 27 lbs (R 71 lbs) Goal status: INITIAL  3.  Pt will increase  LUE strength at the elbow and wrist in order to carry a heavy laundry basket with BUEs. Baseline: L elbow flex 4-, wrist flex 3+ Goal status: INITIAL  4.  Pt will increase L lateral pinch strength in order to ease ability to open small bottles and food packages.  Baseline: L 6 lbs (R 16 lbs) Goal status: INITIAL  5.  Pt will increase L FMC/dexterity skills to ease ability to manipulate small ADL supplies.  Baseline: L 9 hole 48 sec (R 36 sec); extra time to manage clothing fasteners and to pick up pills Goal status: INITIAL   ASSESSMENT: CLINICAL IMPRESSION: Pt reports he's sore all over from a fall that he had on Friday when it was dark and he tripped on a curb.  Pt reports he mostly scraped his legs but he's sore in the L shoulder as well, but still functional for self care skills.  Avoided activity above shoulder level today to rest sore muscles.  Good tolerance to all coordination and strengthening activities this date.  Pt reports that he feels like his L hand is gripping his walker better at home.  Pt. continues to present with limited UE strength, and [facility] skills. Pt. Continues to benefit form OT services to work on improving LUE functioning, and maximizing independence with ADLs, and IADLs.  PERFORMANCE DEFICITS: in functional skills including ADLs, IADLs, coordination, dexterity, sensation, strength, pain, Fine motor control, Gross motor control, mobility, balance, body  mechanics, endurance, decreased knowledge of use of DME, vision, and UE functional use, and psychosocial skills including coping strategies, environmental adaptation, habits, and routines and behaviors.   IMPAIRMENTS: are limiting patient from ADLs and IADLs.   CO-MORBIDITIES: has co-morbidities such as legally blind L eye, obesity, arthritis  that affects occupational performance. Patient will benefit from skilled OT to address above impairments and improve overall function.  MODIFICATION OR ASSISTANCE TO COMPLETE  EVALUATION: No modification of tasks or assist necessary to complete an evaluation.  OT OCCUPATIONAL PROFILE AND HISTORY: Problem focused assessment: Including review of records relating to presenting problem.  CLINICAL DECISION MAKING: Moderate - several treatment options, min-mod task modification necessary  REHAB POTENTIAL: Good  EVALUATION COMPLEXITY: Moderate    PLAN:  OT FREQUENCY: 2x/week  OT DURATION: 12 weeks  PLANNED INTERVENTIONS: self care/ADL training, therapeutic exercise, therapeutic activity, neuromuscular re-education, manual therapy, passive range of motion, balance training, functional mobility training, moist heat, cryotherapy, contrast bath, patient/family education, energy conservation, coping strategies training, and DME and/or AE instructions  RECOMMENDED OTHER SERVICES: None at this time  CONSULTED AND AGREED WITH PLAN OF CARE: Patient  PLAN FOR NEXT SESSION: see above  Leta Speller, MS, OTR/L

## 2022-06-20 ENCOUNTER — Ambulatory Visit: Payer: BC Managed Care – PPO

## 2022-06-20 ENCOUNTER — Ambulatory Visit: Payer: BC Managed Care – PPO | Admitting: Occupational Therapy

## 2022-06-20 ENCOUNTER — Encounter: Payer: Self-pay | Admitting: Occupational Therapy

## 2022-06-20 DIAGNOSIS — R262 Difficulty in walking, not elsewhere classified: Secondary | ICD-10-CM

## 2022-06-20 DIAGNOSIS — R278 Other lack of coordination: Secondary | ICD-10-CM

## 2022-06-20 DIAGNOSIS — M6281 Muscle weakness (generalized): Secondary | ICD-10-CM | POA: Diagnosis not present

## 2022-06-20 DIAGNOSIS — R2681 Unsteadiness on feet: Secondary | ICD-10-CM

## 2022-06-20 NOTE — Therapy (Signed)
OUTPATIENT OCCUPATIONAL THERAPY NEURO TREATMENT NOTE  Patient Name: Jeremiah Vance MRN: [id number] DOB:Apr 22, 1970, 52 y.o., male Today's Date: 06/20/2022  PCP: [name], PA-C REFERRING PROVIDER: [name], PA-C   OT End of Session - 06/20/22 0859     Visit Number 5    Number of Visits 24    Date for OT Re-Evaluation 08/22/22    Progress Note Due on Visit 10    OT Start Time 0845    OT Stop Time 0930    OT Time Calculation (min) 45 min    Equipment Utilized During Treatment transport chair    Activity Tolerance Patient tolerated treatment well    Behavior During Therapy [facility] for tasks assessed/performed               Past Medical History:  Diagnosis Date   Acute ischemic stroke ([place]) 09/06/2017   Acute renal failure superimposed on stage 3a chronic kidney disease ([place]) 07/08/2019   Acute right-sided weakness    Allergies    Anasarca    Anemia 07/10/2017   Arthritis    CHF (congestive heart failure) ([place])    "coinsided w/kidney problems I was having 06/2017"   Chicken pox    CKD (chronic kidney disease) stage 3, GFR 30-59 ml/min (HCC) 06/2017   Depression    Elevated troponin 07/08/2019   High cholesterol    History of cardiomyopathy    LVEF 40 to 45% in April 2019 - subsequently normalized   Hyperbilirubinemia 07/10/2017   Hypertension    Ischemic stroke ([place])    Small left internal capsule infarct due to lacunar disease   Morbid obesity ([place])    Normocytic anemia 12/16/2017   Recurrent incisional hernia with incarceration s/p repair 10/22/2017 10/21/2017   Stroke ([place]) 08/2017   "right sided weakness since; getting stronger though" (10/22/2017)   Type 2 diabetes mellitus ([place])    Past Surgical History:  Procedure Laterality Date   ABDOMINAL HERNIA REPAIR  2008; 10/22/2017   "scope; OPEN REPAIR INCARCERATED VENTRAL HERNIA   HERNIA REPAIR     KNEE ARTHROSCOPY Right 1989   VENTRAL HERNIA REPAIR N/A 10/22/2017   Procedure: OPEN REPAIR INCARCERATED VENTRAL  HERNIA;  Surgeon: Excell Seltzer, MD;  Location: [facility];  Service: General;  Laterality: N/A;   Patient Active Problem List   Diagnosis Date Noted   Dysarthria 05/20/2022   Colon cancer screening 03/20/2022   Need for diphtheria-tetanus-pertussis (Tdap) vaccine 03/20/2022   Acute CVA (cerebrovascular accident) ([place]) 01/16/2022   Type 2 diabetes mellitus without complication, with long-term current use of insulin ([place]) 01/16/2022   Dyslipidemia 01/16/2022   Chronic diastolic CHF (congestive heart failure) ([place]) 01/16/2022   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    Intracranial hemorrhage (HCC)    HFrEF (heart failure with reduced ejection fraction) ([place])    Hypertensive emergency 11/22/2021   Obesity, Class III, BMI 40-49.9 (morbid obesity) ([place]) 05/22/2020   Cough    Other cirrhosis of liver (HCC)    Ventral hernia without obstruction or gangrene    Acute CHF (congestive heart failure) ([place]) 02/20/2020   Shortness of breath    Hypertensive urgency    Neck pain    Prostate cancer screening 01/21/2020   Lymphedema of both lower extremities 10/17/2019   History of stroke 10/17/2019   CKD (chronic kidney disease) stage 3, GFR 30-59 ml/min (HCC) 08/31/2019   Swelling of limb 08/31/2019   DM (diabetes mellitus), type 2 with complications ([place]) [id number]  Acute kidney injury superimposed on CKD ([place]) 07/08/2019   Chest pain 07/08/2019   Vitreous floaters of both eyes 06/03/2018   Moderate obstructive sleep apnea 02/11/2018   Chronic fatigue 12/16/2017   Essential hypertension 12/16/2017   History of small bowel obstruction 10/24/2017   Adjustment disorder with depressed mood 09/30/2017   Stroke ([place]) 09/14/2017   Acute on chronic heart failure with preserved ejection fraction (HFpEF) (HCC)    Hyperlipidemia    Gout    Elevated LFTs    Heart failure with preserved ejection fraction ([place])    Morbid obesity ([place])    Hypoalbuminemia 07/11/2017    Type 2 diabetes mellitus with hyperglycemia ([place]) 07/10/2017   ONSET DATE: Aug 2023  REFERRING DIAG: L sided weakness  THERAPY DIAG:  Muscle weakness (generalized)  Other lack of coordination  Unsteadiness on feet  Difficulty in walking, not elsewhere classified  Rationale for Evaluation and Treatment: Rehabilitation  SUBJECTIVE: Pt endorses residual weakness in the L arm from CVA in Aug of 2023.  SUBJECTIVE STATEMENT: Pt reports he's sore all over from a fall that he had on Friday when it was dark and he tripped on a curb.  Pt reports he mostly scraped his legs but he's sore in the L shoulder as well.    PERTINENT HISTORY: Pt reports 2-3 CVAs in Aug of 2023, but a total of 5 CVAs over the course of 4 years.  After the strokes in Aug, pt participated in [facility] therapy for 2-3 months.  Pt reports that he had been able to return to work up until 05/20/22.  Pt reported that his work felt like he was exhibiting stroke symptoms, so he went to the ED.  Per chart from ED visit on 05/20/22:     Clinical Course as of 05/20/22 1559  Mon May 20, 2022  1505 Prior CVA with soft blood pressures - reactivation of old CVA.  CTA negative. Code stroke and if MRI negative then will dc home.  [SM]     Clinical Course User Index [SM] [name], MD  MRI negative.  Patient states that he is feeling better.  Blood pressure improved while in the emergency department.  Last blood pressure [id number] systolic.  Patient discharged home in stable condition with discussion to follow-up as an outpatient and return precautions given.   [name], MD 05/20/22 1733     PRECAUTIONS: fall  WEIGHT BEARING RESTRICTIONS: No  PAIN:  Are you having pain? Yes: NPRS scale: 7-8/10 Pain location: R shoulder (arthritic) Pain description: achy Aggravating factors: cold weather, walking, stairs Relieving factors: rest, elevation  FALLS: Has patient fallen in last 6 months? Yes. 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  PATIENT GOALS: To get the L side stronger and return to work.   OBJECTIVE:   HAND DOMINANCE: Right  ADLs: Overall ADLs: modified indep with primarily use of the R arm. Transfers/ambulation related to ADLs: uses the walker outside, indoors no walker Eating: sister cuts food  Grooming: indep UB Dressing: extra time  to manage clothing fasteners LB Dressing: slip on shoes; tucks laces into shoes Toileting: indep Bathing: modified indep Tub Shower transfers: walk in shower Equipment: see above  IADLs: Shopping: pt can accompany sister and pt pushes the cart Light housekeeping: shared task between pt and sister  Meal Prep: some difficulty opening new packages/containers but can manage simple light meal prep/microwavable meals  Community mobility: able to drive, limited ambulator d/t obesity, arthritic knees, and L sided weakness Medication management: sister sets up pills into weekly pill organizer  Financial management: pt manages  Handwriting: No issues (pt is R hand dominant)  POSTURE COMMENTS:  rounded shoulders  ACTIVITY TOLERANCE: Activity tolerance: fair; limited community ambulator  FUNCTIONAL OUTCOME MEASURES: FOTO: 52; predicted 55  UPPER EXTREMITY ROM:    Active ROM Right Eval WNL Left eval  Shoulder flexion  98 (limited by pain)  Shoulder abduction  102  Shoulder adduction    Shoulder extension    Shoulder internal rotation    Shoulder external rotation    Elbow flexion    Elbow extension     Wrist flexion    Wrist extension    Wrist ulnar deviation    Wrist radial deviation    Wrist pronation    Wrist supination    (Blank rows = not tested)  UPPER EXTREMITY MMT:     MMT Right eval Left eval  Shoulder flexion 4+ NT (pain)  Shoulder abduction 4 NT (pain)  Shoulder adduction    Shoulder extension    Shoulder internal rotation    Shoulder external rotation    Middle trapezius    Lower trapezius    Elbow flexion 5 4-  Elbow extension 5 4-  Wrist flexion 5 3+  Wrist extension 5 3+  Wrist ulnar deviation    Wrist radial deviation    Wrist pronation    Wrist supination    (Blank rows = not tested)  HAND FUNCTION: Grip strength: Right: 71 lbs; Left: 27 lbs, Lateral pinch: Right: 16 lbs, Left: 6 lbs, and 3 point pinch: Right: 18 lbs, Left: 8 lbs  COORDINATION: Finger Nose Finger test: L sided good accuracy but slower  9 Hole Peg test: Right: 38 sec; Left: 46 sec Moderate difficulty opposing L hand digits to thumb   SENSATION: Light touch: Impaired , pt reports some L hand numbness ulnar nerve distribution volar and dorsal aspect, does not go up the arm  EDEMA: none  MUSCLE TONE: normal  COGNITION: Overall cognitive status: WFL for tasks assessed  VISION: Subjective report: 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, wears reading glasses all the time and reports increased blurred vision in the R eye since strokes in Aug of 2023  VISION ASSESSMENT: Tracking/Visual pursuits: impaired Saccades: impaired Visual Fields: pt limited in all visual fields of R eye (chart notes floaters both eyes)  Patient has difficulty with following activities due to following visual impairments: pt does not drive  PERCEPTION: WFL  PRAXIS: Impaired: Motor planning  TODAY'S TREATMENT:  Therapeutic Exercise: Pt seen for reaching with left hand to  move and place looped SAEBO balls onto multilevel tower, able to perform all 4 levels Finger strengthening with use of push pins in moderate resistive board, using left hand, cues for prehension patterns.  Cues to push pins fully into the board.   Grip strengthening with resistive hand gripper, 3rd setting (17.9#) for 15 reps for 1 set.    Neuromuscular re-education: Card flipping for coordination skills with left hand, thumb and finger combinations, cues for prehension patterns, speed of task.  Picking up cards in ascending order of each suit using scatter pile.  Pt does have limited vision but able to perform this task with increased time allowed to search for cards.       PATIENT EDUCATION: Education details: LUE strengthening and coordination training Person educated: Patient Education method: Explanation and Verbal cues Education comprehension: verbalized understanding  HOME EXERCISE PROGRAM: Theraputty  GOALS: Goals reviewed with patient? Yes  SHORT TERM GOALS: Target date: 07/11/22 (6 weeks)  Pt will be indep to perform HEP for increasing strength and coordination throughout the LUE. Baseline: Eval: issued putty exercises Goal status: INITIAL   LONG TERM GOALS: Target date: 08/22/22 (12 weeks)  Pt will increase FOTO score to 55 or better to indicate clinically relevant improvement in self perceived use of L arm to perform daily tasks.  Baseline: Eval: 52 Goal status: INITIAL  2.  Pt will increase L grip strength by 10 or more lbs in order to hold and carry ADL supplies in L hand. Baseline: Eval: L grip 27 lbs (R 71 lbs) Goal status: INITIAL  3.  Pt will increase LUE strength at the elbow and wrist in order to carry a heavy laundry basket with BUEs. Baseline: L elbow flex 4-, wrist flex 3+ Goal status: INITIAL  4.  Pt will increase L lateral pinch strength in order to ease ability to open small bottles and food packages.  Baseline: L 6 lbs (R 16 lbs) Goal status:  INITIAL  5.  Pt will increase L FMC/dexterity skills to ease ability to manipulate small ADL supplies.  Baseline: L 9 hole 48 sec (R 36 sec); extra time to manage clothing fasteners and to pick up pills Goal status: INITIAL   ASSESSMENT: CLINICAL IMPRESSION: Pt progressing well, required cues this date for reaching patterns, pt able to complete strengthening exercises with cues for hand positioning on gripper and for proper form and technique.  Pt able to engage in task with playing cards and although vision is limited, he was able to pick out cards from scatter pile and place into ascending order, some difficulty noted with picking up cards at times from flat surface and occasionally had to move cards to edge of table to pick up.  Continue to work towards goals in plan of care to maximize safety and independence in necessary daily tasks.    PERFORMANCE DEFICITS: in functional skills including ADLs, IADLs, coordination, dexterity, sensation, strength, pain, Fine motor control, Gross motor control, mobility, balance, body mechanics, endurance, decreased knowledge of use of DME, vision, and UE functional use, and psychosocial skills including coping strategies, environmental adaptation, habits, and routines and behaviors.   IMPAIRMENTS: are limiting patient from ADLs and IADLs.   CO-MORBIDITIES: has co-morbidities such as legally blind L eye, obesity, arthritis  that affects occupational performance. Patient will benefit from skilled OT to address above impairments and improve overall function.  MODIFICATION OR ASSISTANCE TO COMPLETE EVALUATION: No modification of  tasks or assist necessary to complete an evaluation.  OT OCCUPATIONAL PROFILE AND HISTORY: Problem focused assessment: Including review of records relating to presenting problem.  CLINICAL DECISION MAKING: Moderate - several treatment options, min-mod task modification necessary  REHAB POTENTIAL: Good  EVALUATION COMPLEXITY:  Moderate  PLAN:  OT FREQUENCY: 2x/week  OT DURATION: 12 weeks  PLANNED INTERVENTIONS: self care/ADL training, therapeutic exercise, therapeutic activity, neuromuscular re-education, manual therapy, passive range of motion, balance training, functional mobility training, moist heat, cryotherapy, contrast bath, patient/family education, energy conservation, coping strategies training, and DME and/or AE instructions  RECOMMENDED OTHER SERVICES: None at this time  CONSULTED AND AGREED WITH PLAN OF CARE: Patient  PLAN FOR NEXT SESSION: see above   Mills Mitton T Dora Clauss, OTR/L, CLT 21/Mar/2024 21:18 pm

## 2022-06-24 ENCOUNTER — Ambulatory Visit: Payer: BC Managed Care – PPO

## 2022-06-24 DIAGNOSIS — R278 Other lack of coordination: Secondary | ICD-10-CM

## 2022-06-24 DIAGNOSIS — M6281 Muscle weakness (generalized): Secondary | ICD-10-CM | POA: Diagnosis not present

## 2022-06-24 NOTE — Therapy (Signed)
OUTPATIENT OCCUPATIONAL THERAPY NEURO TREATMENT NOTE  Patient Name: Jeremiah Vance MRN: [id number] DOB:1970/05/16, 52 y.o., male Today's Date: 06/24/2022  PCP: [name], PA-C REFERRING PROVIDER: [name], PA-C   OT End of Session - 06/24/22 1516     Visit Number 6    Number of Visits 24    Date for OT Re-Evaluation 08/22/22    Progress Note Due on Visit 10    OT Start Time 1515    OT Stop Time 1600    OT Time Calculation (min) 45 min    Equipment Utilized During Treatment transport chair    Activity Tolerance Patient tolerated treatment well    Behavior During Therapy [facility] for tasks assessed/performed               Past Medical History:  Diagnosis Date   Acute ischemic stroke ([place]) 09/06/2017   Acute renal failure superimposed on stage 3a chronic kidney disease ([place]) 07/08/2019   Acute right-sided weakness    Allergies    Anasarca    Anemia 07/10/2017   Arthritis    CHF (congestive heart failure) ([place])    "coinsided w/kidney problems I was having 06/2017"   Chicken pox    CKD (chronic kidney disease) stage 3, GFR 30-59 ml/min (HCC) 06/2017   Depression    Elevated troponin 07/08/2019   High cholesterol    History of cardiomyopathy    LVEF 40 to 45% in April 2019 - subsequently normalized   Hyperbilirubinemia 07/10/2017   Hypertension    Ischemic stroke ([place])    Small left internal capsule infarct due to lacunar disease   Morbid obesity ([place])    Normocytic anemia 12/16/2017   Recurrent incisional hernia with incarceration s/p repair 10/22/2017 10/21/2017   Stroke ([place]) 08/2017   "right sided weakness since; getting stronger though" (10/22/2017)   Type 2 diabetes mellitus ([place])    Past Surgical History:  Procedure Laterality Date   ABDOMINAL HERNIA REPAIR  2008; 10/22/2017   "scope; OPEN REPAIR INCARCERATED VENTRAL HERNIA   HERNIA REPAIR     KNEE ARTHROSCOPY Right 1989   VENTRAL HERNIA REPAIR N/A 10/22/2017   Procedure: OPEN REPAIR INCARCERATED VENTRAL  HERNIA;  Surgeon: Excell Seltzer, MD;  Location: [place];  Service: General;  Laterality: N/A;   Patient Active Problem List   Diagnosis Date Noted   Dysarthria 05/20/2022   Colon cancer screening 03/20/2022   Need for diphtheria-tetanus-pertussis (Tdap) vaccine 03/20/2022   Acute CVA (cerebrovascular accident) ([place]) 01/16/2022   Type 2 diabetes mellitus without complication, with long-term current use of insulin ([place]) 01/16/2022   Dyslipidemia 01/16/2022   Chronic diastolic CHF (congestive heart failure) ([place]) 01/16/2022   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    Intracranial hemorrhage (HCC)    HFrEF (heart failure with reduced ejection fraction) ([place])    Hypertensive emergency 11/22/2021   Obesity, Class III, BMI 40-49.9 (morbid obesity) ([place]) 05/22/2020   Cough    Other cirrhosis of liver (HCC)    Ventral hernia without obstruction or gangrene    Acute CHF (congestive heart failure) ([place]) 02/20/2020   Shortness of breath    Hypertensive urgency    Neck pain    Prostate cancer screening 01/21/2020   Lymphedema of both lower extremities 10/17/2019   History of stroke 10/17/2019   CKD (chronic kidney disease) stage 3, GFR 30-59 ml/min (HCC) 08/31/2019   Swelling of limb 08/31/2019   DM (diabetes mellitus), type 2 with complications ([place]) [id number]  Acute kidney injury superimposed on CKD ([place]) 07/08/2019   Chest pain 07/08/2019   Vitreous floaters of both eyes 06/03/2018   Moderate obstructive sleep apnea 02/11/2018   Chronic fatigue 12/16/2017   Essential hypertension 12/16/2017   History of small bowel obstruction 10/24/2017   Adjustment disorder with depressed mood 09/30/2017   Stroke ([place]) 09/14/2017   Acute on chronic heart failure with preserved ejection fraction (HFpEF) (HCC)    Hyperlipidemia    Gout    Elevated LFTs    Heart failure with preserved ejection fraction ([place])    Morbid obesity ([place])    Hypoalbuminemia 07/11/2017    Type 2 diabetes mellitus with hyperglycemia ([place]) 07/10/2017   ONSET DATE: Aug 2023  REFERRING DIAG: L sided weakness  THERAPY DIAG:  Muscle weakness (generalized)  Other lack of coordination  Rationale for Evaluation and Treatment: Rehabilitation  SUBJECTIVE: Pt endorses residual weakness in the L arm from CVA in Aug of 2023. SUBJECTIVE STATEMENT: Pt reports no more soreness from a recent fall.  PERTINENT HISTORY: Pt reports 2-3 CVAs in Aug of 2023, but a total of 5 CVAs over the course of 4 years.  After the strokes in Aug, pt participated in [facility] therapy for 2-3 months.  Pt reports that he had been able to return to work up until 05/20/22.  Pt reported that his work felt like he was exhibiting stroke symptoms, so he went to the ED.  Per chart from ED visit on 05/20/22:     Clinical Course as of 05/20/22 1559  Mon May 20, 2022  1505 Prior CVA with soft blood pressures - reactivation of old CVA.  CTA negative. Code stroke and if MRI negative then will dc home.  [SM]     Clinical Course User Index [SM] [name], MD  MRI negative.  Patient states that he is feeling better.  Blood pressure improved while in the emergency department.  Last blood pressure [id number] systolic.  Patient discharged home in stable condition with discussion to follow-up as an outpatient and return precautions given.   [name], MD 05/20/22 1733     PRECAUTIONS: fall  WEIGHT BEARING RESTRICTIONS: No  PAIN:  Are you having pain? Yes: NPRS scale: 6/10 Pain location: R shoulder (arthritic) Pain description: achy Aggravating factors: cold weather, walking, stairs Relieving factors: rest, elevation  FALLS: Has patient fallen in last 6 months? Yes. 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  PATIENT GOALS: To get the L side stronger and return to work.   OBJECTIVE:   HAND DOMINANCE: Right  ADLs: Overall ADLs: modified indep with primarily use of the R arm. Transfers/ambulation related to ADLs: uses the walker outside, indoors no walker Eating: sister cuts food  Grooming: indep UB Dressing: extra time to manage clothing fasteners LB Dressing: slip on shoes; tucks laces into shoes Toileting: indep Bathing: modified indep Tub Shower transfers: walk in shower Equipment: see above  IADLs: Shopping: pt can accompany sister and pt pushes the cart Light housekeeping: shared task between pt and  sister  Meal Prep: some difficulty opening new packages/containers but can manage simple light meal prep/microwavable meals  Community mobility: able to drive, limited ambulator d/t obesity, arthritic knees, and L sided weakness Medication management: sister sets up pills into weekly pill organizer  Financial management: pt manages  Handwriting: No issues (pt is R hand dominant)  POSTURE COMMENTS:  rounded shoulders  ACTIVITY TOLERANCE: Activity tolerance: fair; limited community ambulator  FUNCTIONAL OUTCOME MEASURES: FOTO: 52; predicted 55  UPPER EXTREMITY ROM:    Active ROM Right Eval WNL Left eval  Shoulder flexion  98 (limited by pain)  Shoulder abduction  102  Shoulder adduction    Shoulder extension    Shoulder internal rotation    Shoulder external rotation    Elbow flexion    Elbow extension    Wrist flexion    Wrist extension    Wrist ulnar deviation    Wrist radial deviation    Wrist pronation    Wrist supination    (Blank rows = not tested)  UPPER EXTREMITY MMT:     MMT Right eval Left eval  Shoulder  flexion 4+ NT (pain)  Shoulder abduction 4 NT (pain)  Shoulder adduction    Shoulder extension    Shoulder internal rotation    Shoulder external rotation    Middle trapezius    Lower trapezius    Elbow flexion 5 4-  Elbow extension 5 4-  Wrist flexion 5 3+  Wrist extension 5 3+  Wrist ulnar deviation    Wrist radial deviation    Wrist pronation    Wrist supination    (Blank rows = not tested)  HAND FUNCTION: Grip strength: Right: 71 lbs; Left: 27 lbs, Lateral pinch: Right: 16 lbs, Left: 6 lbs, and 3 point pinch: Right: 18 lbs, Left: 8 lbs  COORDINATION: Finger Nose Finger test: L sided good accuracy but slower  9 Hole Peg test: Right: 38 sec; Left: 46 sec Moderate difficulty opposing L hand digits to thumb   SENSATION: Light touch: Impaired , pt reports some L hand numbness ulnar nerve distribution volar and dorsal aspect, does not go up the arm  EDEMA: none  MUSCLE TONE: normal  COGNITION: Overall cognitive status: WFL for tasks assessed  VISION: Subjective report: 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, wears reading glasses all the time and reports increased blurred vision in the R eye since strokes in Aug of 2023  VISION ASSESSMENT: Tracking/Visual pursuits: impaired Saccades: impaired Visual Fields: pt limited in all visual fields of R eye (chart notes floaters both eyes)  Patient has difficulty with following activities due to following visual impairments: pt does not drive  PERCEPTION: WFL  PRAXIS: Impaired: Motor planning  TODAY'S TREATMENT:                                                                                                                              Therapeutic Exercise: Facilitated pinch  strengthening with use of therapy resistant clothespins to target lateral and 3 point pinch of L hand.  Able to pinch all colors but black with a lateral pinch and all colors but blue and black with a 3 point pinch.  Pt completed 1 trial of each pinch  type, requiring intermittent min tactile cues to formulate and maintain a lateral pinch.  Instructed pt in closed chain green theraband exercises for L elbow and wrist strengthening.  Pt completed 3 sets 10 reps for L elbow flex, extension, and wrist flex, ext; mod vc and tactile cues for correct positioning of band to elicit desired movement.    PATIENT EDUCATION: Education details: LUE strengthening and coordination training Person educated: Patient Education method: Explanation and Verbal cues Education comprehension: verbalized understanding  HOME EXERCISE PROGRAM: Theraputty  GOALS: Goals reviewed with patient? Yes  SHORT TERM GOALS: Target date: 07/11/22 (6 weeks)  Pt will be indep to perform HEP for increasing strength and coordination throughout the LUE. Baseline: Eval: issued putty exercises Goal status: INITIAL   LONG TERM GOALS: Target date: 08/22/22 (12 weeks)  Pt will increase FOTO score to 55 or better to indicate clinically relevant improvement in self perceived use of L arm to perform daily tasks.  Baseline: Eval: 52 Goal status: INITIAL  2.  Pt will increase L grip strength by 10 or more lbs in order to hold and carry ADL supplies in L hand. Baseline: Eval: L grip 27 lbs (R 71 lbs) Goal status: INITIAL  3.  Pt will increase LUE strength at the elbow and wrist in order to carry a heavy laundry basket with BUEs. Baseline: L elbow flex 4-, wrist flex 3+ Goal status: INITIAL  4.  Pt will increase L lateral pinch strength in order to ease ability to open small bottles and food packages.  Baseline: L 6 lbs (R 16 lbs) Goal status: INITIAL  5.  Pt will increase L FMC/dexterity skills to ease ability to manipulate small ADL supplies.  Baseline: L 9 hole 48 sec (R 36 sec); extra time to manage clothing fasteners and to pick up pills Goal status: INITIAL   ASSESSMENT: CLINICAL IMPRESSION: Pt progressing well, required cues this date for reaching patterns, pt able  to complete strengthening exercises with cues for hand positioning on gripper and for proper form and technique.  Pt able to engage in task with playing cards and although vision is limited, he was able to pick out cards from scatter pile and place into ascending order, some difficulty noted with picking up cards at times from flat surface and occasionally had to move cards to edge of table to pick up.  Continue to work towards goals in plan of care to maximize safety and independence in necessary daily tasks.    PERFORMANCE DEFICITS: in functional skills including ADLs, IADLs, coordination, dexterity, sensation, strength, pain, Fine motor control, Gross motor control, mobility, balance, body mechanics, endurance, decreased knowledge of use of DME, vision, and UE functional use, and psychosocial skills including coping strategies, environmental adaptation, habits, and routines and behaviors.   IMPAIRMENTS: are limiting patient from ADLs and IADLs.   CO-MORBIDITIES: has co-morbidities such as legally blind L eye, obesity, arthritis  that affects occupational performance. Patient will benefit from skilled OT to address above impairments and improve overall function.  MODIFICATION OR ASSISTANCE TO COMPLETE EVALUATION: No modification of tasks or assist necessary to complete an evaluation.  OT OCCUPATIONAL PROFILE AND HISTORY: Problem focused assessment: Including review of records relating  to presenting problem.  CLINICAL DECISION MAKING: Moderate - several treatment options, min-mod task modification necessary  REHAB POTENTIAL: Good  EVALUATION COMPLEXITY: Moderate  PLAN:  OT FREQUENCY: 2x/week  OT DURATION: 12 weeks  PLANNED INTERVENTIONS: self care/ADL training, therapeutic exercise, therapeutic activity, neuromuscular re-education, manual therapy, passive range of motion, balance training, functional mobility training, moist heat, cryotherapy, contrast bath, patient/family education, energy  conservation, coping strategies training, and DME and/or AE instructions  RECOMMENDED OTHER SERVICES: None at this time  CONSULTED AND AGREED WITH PLAN OF CARE: Patient  PLAN FOR NEXT SESSION: see above   Amy T Lovett, OTR/L, CLT 21/Mar/2024 21:18 pm

## 2022-06-26 ENCOUNTER — Ambulatory Visit: Payer: BC Managed Care – PPO

## 2022-06-26 DIAGNOSIS — M6281 Muscle weakness (generalized): Secondary | ICD-10-CM

## 2022-06-26 DIAGNOSIS — R278 Other lack of coordination: Secondary | ICD-10-CM

## 2022-06-26 DIAGNOSIS — R2681 Unsteadiness on feet: Secondary | ICD-10-CM

## 2022-06-26 DIAGNOSIS — R262 Difficulty in walking, not elsewhere classified: Secondary | ICD-10-CM

## 2022-06-26 NOTE — Therapy (Signed)
OUTPATIENT PHYSICAL THERAPY NEURO TREATMENT   Patient Name: Jeremiah Vance MRN: [id number] DOB:February 03, 1971, 52 y.o., male Today's Date: 06/26/2022   PCP: [name] PA REFERRING PROVIDER: [name] PA  END OF SESSION:  PT End of Session - 06/26/22 1020     Visit Number 7    Number of Visits 24    Date for PT Re-Evaluation 08/22/22    Progress Note Due on Visit 10    PT Start Time 1016    PT Stop Time 1058    PT Time Calculation (min) 42 min    Equipment Utilized During Treatment Gait belt    Activity Tolerance Patient tolerated treatment well;Patient limited by pain    Behavior During Therapy [facility] for tasks assessed/performed                  Past Medical History:  Diagnosis Date   Acute ischemic stroke ([place]) 09/06/2017   Acute renal failure superimposed on stage 3a chronic kidney disease ([place]) 07/08/2019   Acute right-sided weakness    Allergies    Anasarca    Anemia 07/10/2017   Arthritis    CHF (congestive heart failure) ([place])    "coinsided w/kidney problems I was having 06/2017"   Chicken pox    CKD (chronic kidney disease) stage 3, GFR 30-59 ml/min (HCC) 06/2017   Depression    Elevated troponin 07/08/2019   High cholesterol    History of cardiomyopathy    LVEF 40 to 45% in April 2019 - subsequently normalized   Hyperbilirubinemia 07/10/2017   Hypertension    Ischemic stroke ([place])    Small left internal capsule infarct due to lacunar disease   Morbid obesity ([place])    Normocytic anemia 12/16/2017   Recurrent incisional hernia with incarceration s/p repair 10/22/2017 10/21/2017   Stroke ([place]) 08/2017   "right sided weakness since; getting stronger though" (10/22/2017)   Type 2 diabetes mellitus ([place])    Past Surgical History:  Procedure Laterality Date   ABDOMINAL HERNIA REPAIR  2008; 10/22/2017   "scope; OPEN REPAIR INCARCERATED VENTRAL HERNIA   HERNIA REPAIR     KNEE ARTHROSCOPY Right 1989   VENTRAL HERNIA REPAIR N/A 10/22/2017   Procedure: OPEN  REPAIR INCARCERATED VENTRAL HERNIA;  Surgeon: Excell Seltzer, MD;  Location: [facility];  Service: General;  Laterality: N/A;   Patient Active Problem List   Diagnosis Date Noted   Dysarthria 05/20/2022   Colon cancer screening 03/20/2022   Need for diphtheria-tetanus-pertussis (Tdap) vaccine 03/20/2022   Acute CVA (cerebrovascular accident) ([place]) 01/16/2022   Type 2 diabetes mellitus without complication, with long-term current use of insulin ([place]) 01/16/2022   Dyslipidemia 01/16/2022   Chronic diastolic CHF (congestive heart failure) ([place]) 01/16/2022   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    Intracranial hemorrhage (HCC)    HFrEF (heart failure with reduced ejection fraction) ([place])    Hypertensive emergency 11/22/2021   Obesity, Class III, BMI 40-49.9 (morbid obesity) ([place]) 05/22/2020   Cough    Other cirrhosis of liver (HCC)    Ventral hernia without obstruction or gangrene    Acute CHF (congestive heart failure) ([place]) 02/20/2020   Shortness of breath    Hypertensive urgency    Neck pain    Prostate cancer screening 01/21/2020   Lymphedema of both lower extremities 10/17/2019   History of stroke 10/17/2019   CKD (chronic kidney disease) stage 3, GFR 30-59 ml/min (HCC) 08/31/2019   Swelling of limb 08/31/2019   DM (  diabetes mellitus), type 2 with complications ([place]) [id number]   Acute kidney injury superimposed on CKD ([place]) 07/08/2019   Chest pain 07/08/2019   Vitreous floaters of both eyes 06/03/2018   Moderate obstructive sleep apnea 02/11/2018   Chronic fatigue 12/16/2017   Essential hypertension 12/16/2017   History of small bowel obstruction 10/24/2017   Adjustment disorder with depressed mood 09/30/2017   Stroke ([place]) 09/14/2017   Acute on chronic heart failure with preserved ejection fraction (HFpEF) (HCC)    Hyperlipidemia    Gout    Elevated LFTs    Heart failure with preserved ejection fraction ([place])    Morbid obesity ([place])     Hypoalbuminemia 07/11/2017   Type 2 diabetes mellitus with hyperglycemia ([place]) 07/10/2017    ONSET DATE: 05/20/22  REFERRING DIAG: Left sided weakness   THERAPY DIAG:  Muscle weakness (generalized)  Other lack of coordination  Unsteadiness on feet  Difficulty in walking, not elsewhere classified  Rationale for Evaluation and Treatment: Rehabilitation  SUBJECTIVE:                                                                                                                                                                                             SUBJECTIVE STATEMENT: Patient reports doing okay so far today- knee is sore but states walked in without difficulty   Pt accompanied by: self  PERTINENT HISTORY:   Patient presents for Left sided weakness. Patient presented to [facility] on 05/20/22 for L sided weakness and slurred speech.MRI negative.  PMH includes CVA (2019, 2023), obesity, Type II DM, HTN, HLD, lymphedema of BLE, Stage IIIa CKD, anemia, depression, hernia,arthritis, CHF. Patient had multiple strokes in  fall 2023 and received home health. 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 Patient has not worked the past two weeks due to another incident 2-3 weeks ago. Prior to stroke was independent, use a walker occasionally now.   PAIN:  Are you having pain? Yes: NPRS scale: 8/10 Pain location: R lower leg Pain description: aching Aggravating factors: Fall  Relieving factors: sitting down, keeping legs raised, hot shower  PRECAUTIONS: Fall  WEIGHT BEARING RESTRICTIONS: No  FALLS: Has patient fallen in last 6 months? Yes. Number of falls 1  LIVING ENVIRONMENT: Lives with: lives with their family Lives in: House/apartment Stairs: Yes: Internal: flight steps; on right going up Has following equipment at home: Gilford Rile - 2 wheeled  PLOF: Independent  PATIENT GOALS: to get stronger  OBJECTIVE:   DIAGNOSTIC FINDINGS:   MRI: IMPRESSION: 1. No evidence of acute  abnormality. 2. Prior hemorrhages, prior lacunar infarcts, and prior chronic microvascular ischemic disease  which is advanced for patient age. Findings could be in part secondary to chronic hypertension.  CTA:   1. No emergent large vessel occlusion. 2. 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 3. Multifocal irregularity and moderate narrowing of the right vertebral artery, mildly progressed.  COGNITION: Overall cognitive status:  short term memory issues s/p stroke.    SENSATION: Light touch: Impaired LLE  COORDINATION: LLE coordination impaired with delay and limited coordination.   EDEMA:  Bilateral lymphedema of LE's   POSTURE: rounded shoulders, forward head, and flexed trunk    LOWER EXTREMITY MMT:    MMT Right Eval Left Eval  Hip flexion 3 3  Hip extension    Hip abduction 4- 3+  Hip adduction 4- 3+  Hip internal rotation    Hip external rotation    Knee flexion 3+ 3  Knee extension 3+ 3  Ankle dorsiflexion 4- 3-  Ankle plantarflexion 4- 3+  Ankle inversion    Ankle eversion    (Blank rows = not tested)   TRANSFERS: Assistive device utilized: None  Sit to stand: CGA Stand to sit: CGA Chair to chair: CGA    GAIT: Gait pattern: step through pattern, decreased stride length, antalgic, wide BOS, poor foot clearance- Right, and poor foot clearance- Left Distance walked: 40 ft Assistive device utilized: None Level of assistance: CGA Comments: Patient is unsteady without AD, has antalgic gait pattern with R knee however has limited strength for weightbearing on LLE.   FUNCTIONAL TESTS:  5 times sit to stand: 41 seconds  10 meter walk test: 21 seconds increased knee pain 6/10 Berg Balance Scale: 14/56  PATIENT SURVEYS:  FOTO 44  TODAY'S TREATMENT:                                                                                                                              DATE: 06/26/22  Unless otherwise stated, CGA was provided  and gait belt donned in order to ensure pt safety       TherEx:  Nustep seat position 9; BUE/LE - Level 1 x3 minutes for mm strength and cardiovascular response   -Seated hip march 2.5# x 10 reps  (slow with left LE)   - Seated hip flex/add/abd up/over 1/2 spike ball x 10 reps each (patient reported as fatiguing)    -Seated LAQ with 3 second holds, 10x each LE, cueing for muscle activation and sequencing for neutral alignment- difficulty with left LE weakness - able to complete.   - standing heel raise 10x2 sets each LE (mild report of dizziness on 1st set- went away immediately with sitting)   -Hip abd alt LE (Standing) x 10 reps each  -Adduction ball squeeze 15x with 3 sec hold  Side step over half foam roller 10x each side (patient reports very fatigued at this point.    Ambulate approx 80 feet without an AD  with cues for safety awareness, posture- look ahead and VC  for heel strike as able - all with CGA  Pt required occasional rest breaks due fatigue, PT was quick to ask when pt appeared to be fatiguing in order to prevent excessive fatigue.    PATIENT EDUCATION: Education details: goals, POC, HEP Person educated: Patient Education method: Explanation, Demonstration, Tactile cues, and Verbal cues Education comprehension: verbalized understanding, returned demonstration, verbal cues required, and tactile cues required  HOME EXERCISE PROGRAM: Access Code: [email or web address] URL: [email or web address] Date: 05/30/2022 Prepared by: Janna Arch  Exercises - Leg Extension  - 1 x daily - 7 x weekly - 2 sets - 10 reps - 5 hold - Seated March  - 1 x daily - 7 x weekly - 2 sets - 10 reps - 5 hold - Seated Ankle Circles  - 1 x daily - 7 x weekly - 2 sets - 10 reps - 5 hold  GOALS: Goals reviewed with patient? Yes  SHORT TERM GOALS: Target date: 06/27/2022    Patient will be independent in home exercise program to improve strength/mobility for better functional  independence with ADLs. Baseline: 2/29: HEP given Goal status: INITIAL   LONG TERM GOALS: Target date: 08/22/2022    Patient will increase FOTO score to equal to or greater than  54%   to demonstrate statistically significant improvement in mobility and quality of life.  Baseline: 2/29: 44%  Goal status: INITIAL  2.  Patient (< 4 years old) will complete five times sit to stand test in < 10 seconds indicating an increased LE strength and improved balance. Baseline: 2/29: 41 seconds with significant UE support Goal status: INITIAL  3.  Patient will increase Berg Balance score by > 10 points (24/56) to demonstrate decreased fall risk during functional activities. Baseline: 2/29: 14/56 Goal status: INITIAL  4.  Patient will increase 10 meter walk test to >1.33m/s as to improve gait speed for better community ambulation and to reduce fall risk. Baseline: 2/29: 21 seconds increased knee pain Goal status: INITIAL    ASSESSMENT:  CLINICAL IMPRESSION:  Patient presents with continued ongoing fatigue with minimal reps with LE strengthening. He is well motivated and cooperative yet tires quickly with all standing and even some seated exercises. He is ambulating without a device but may benefit from AD for optimal safety- will continue to monitor and work with him to maximize his efficiency with gait. No increased complaint of left knee pain today with standing activities or walking. Patient will continue to benefit from skilled physical therapy intervention to address impairments, improve QOL, and attain therapy goals.    OBJECTIVE IMPAIRMENTS: Abnormal gait, cardiopulmonary status limiting activity, decreased activity tolerance, decreased balance, decreased coordination, decreased endurance, decreased mobility, difficulty walking, decreased strength, increased edema, impaired perceived functional ability, impaired flexibility, impaired sensation, impaired UE functional use, impaired  vision/preception, improper body mechanics, postural dysfunction, obesity, and pain.   ACTIVITY LIMITATIONS: carrying, lifting, bending, sitting, standing, squatting, stairs, transfers, bed mobility, bathing, toileting, dressing, reach over head, hygiene/grooming, locomotion level, and caring for others  PARTICIPATION LIMITATIONS: meal prep, cleaning, laundry, personal finances, driving, shopping, community activity, occupation, yard work, school, and church  PERSONAL FACTORS: Age, Behavior pattern, Education, Restaurant manager, fast food, Past/current experiences, Profession, Social background, Time since onset of injury/illness/exacerbation, Transportation, and 3+ comorbidities: CVA (2019, 2023), obesity, Type II DM, HTN, HLD, lymphedema of BLE, Stage IIIa CKD, anemia, depression, hernia,arthritis, CHF  are also affecting patient's functional outcome.   REHAB POTENTIAL: Good  CLINICAL DECISION MAKING: Evolving/moderate complexity  EVALUATION COMPLEXITY: Moderate  PLAN:  PT FREQUENCY: 2x/week  PT DURATION: 12 weeks  PLANNED INTERVENTIONS: Therapeutic exercises, Therapeutic activity, Neuromuscular re-education, Balance training, Gait training, Patient/Family education, Self Care, Joint mobilization, Joint manipulation, Stair training, Vestibular training, Canalith repositioning, Visual/preceptual remediation/compensation, Orthotic/Fit training, DME instructions, Cognitive remediation, Electrical stimulation, Spinal mobilization, Cryotherapy, Manual lymph drainage, Compression bandaging, Taping, Vasopneumatic device, Traction, Ultrasound, Ionotophoresis 4mg /ml Dexamethasone, Manual therapy, and Re-evaluation  PLAN FOR NEXT SESSION: balance, strength, ambulatory endurance training    Lewis Moccasin, PT 06/26/2022, 2:08 PM

## 2022-06-27 ENCOUNTER — Other Ambulatory Visit: Payer: Self-pay

## 2022-06-27 ENCOUNTER — Encounter: Payer: Self-pay | Admitting: Physician Assistant

## 2022-06-27 DIAGNOSIS — F32A Depression, unspecified: Secondary | ICD-10-CM

## 2022-06-27 DIAGNOSIS — F4321 Adjustment disorder with depressed mood: Secondary | ICD-10-CM

## 2022-06-27 DIAGNOSIS — I952 Hypotension due to drugs: Secondary | ICD-10-CM

## 2022-06-27 NOTE — Therapy (Signed)
OUTPATIENT OCCUPATIONAL THERAPY NEURO TREATMENT NOTE  Patient Name: Jeremiah Vance MRN: [id number] DOB:April 01, 1971, 52 y.o., male Today's Date: 06/27/2022  PCP: [name], PA-C REFERRING PROVIDER: [name], PA-C   OT End of Session - 06/27/22 1440     Visit Number 7    Number of Visits 24    Date for OT Re-Evaluation 08/22/22    Progress Note Due on Visit 10    OT Start Time 1100    OT Stop Time 1140    OT Time Calculation (min) 40 min               Past Medical History:  Diagnosis Date   Acute ischemic stroke ([place]) 09/06/2017   Acute renal failure superimposed on stage 3a chronic kidney disease ([place]) 07/08/2019   Acute right-sided weakness    Allergies    Anasarca    Anemia 07/10/2017   Arthritis    CHF (congestive heart failure) ([place])    "coinsided w/kidney problems I was having 06/2017"   Chicken pox    CKD (chronic kidney disease) stage 3, GFR 30-59 ml/min (HCC) 06/2017   Depression    Elevated troponin 07/08/2019   High cholesterol    History of cardiomyopathy    LVEF 40 to 45% in April 2019 - subsequently normalized   Hyperbilirubinemia 07/10/2017   Hypertension    Ischemic stroke ([place])    Small left internal capsule infarct due to lacunar disease   Morbid obesity ([place])    Normocytic anemia 12/16/2017   Recurrent incisional hernia with incarceration s/p repair 10/22/2017 10/21/2017   Stroke ([place]) 08/2017   "right sided weakness since; getting stronger though" (10/22/2017)   Type 2 diabetes mellitus ([place])    Past Surgical History:  Procedure Laterality Date   ABDOMINAL HERNIA REPAIR  2008; 10/22/2017   "scope; OPEN REPAIR INCARCERATED VENTRAL HERNIA   HERNIA REPAIR     KNEE ARTHROSCOPY Right 1989   VENTRAL HERNIA REPAIR N/A 10/22/2017   Procedure: OPEN REPAIR INCARCERATED VENTRAL HERNIA;  Surgeon: Excell Seltzer, MD;  Location: [facility];  Service: General;  Laterality: N/A;   Patient Active Problem List   Diagnosis Date Noted   Dysarthria 05/20/2022    Colon cancer screening 03/20/2022   Need for diphtheria-tetanus-pertussis (Tdap) vaccine 03/20/2022   Acute CVA (cerebrovascular accident) ([place]) 01/16/2022   Type 2 diabetes mellitus without complication, with long-term current use of insulin ([place]) 01/16/2022   Dyslipidemia 01/16/2022   Chronic diastolic CHF (congestive heart failure) ([place]) 01/16/2022   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    Intracranial hemorrhage (HCC)    HFrEF (heart failure with reduced ejection fraction) ([place])    Hypertensive emergency 11/22/2021   Obesity, Class III, BMI 40-49.9 (morbid obesity) ([place]) 05/22/2020   Cough    Other cirrhosis of liver ([place])    Ventral hernia without obstruction or gangrene    Acute CHF (congestive heart failure) ([place]) 02/20/2020   Shortness of breath    Hypertensive urgency    Neck pain    Prostate cancer screening 01/21/2020   Lymphedema of both lower extremities 10/17/2019   History of stroke 10/17/2019   CKD (chronic kidney disease) stage 3, GFR 30-59 ml/min ([place]) 08/31/2019   Swelling of limb 08/31/2019   DM (diabetes mellitus), type 2 with complications ([place]) [id number]   Acute kidney injury superimposed on CKD ([place]) 07/08/2019   Chest pain 07/08/2019   Vitreous floaters of both eyes 06/03/2018   Moderate obstructive sleep apnea 02/11/2018  Chronic fatigue 12/16/2017   Essential hypertension 12/16/2017   History of small bowel obstruction 10/24/2017   Adjustment disorder with depressed mood 09/30/2017   Stroke ([place]) 09/14/2017   Acute on chronic heart failure with preserved ejection fraction (HFpEF) (HCC)    Hyperlipidemia    Gout    Elevated LFTs    Heart failure with preserved ejection fraction ([place])    Morbid obesity ([place])    Hypoalbuminemia 07/11/2017   Type 2 diabetes mellitus with hyperglycemia ([place]) 07/10/2017   ONSET DATE: Aug 2023  REFERRING DIAG: L sided weakness  THERAPY DIAG:  Muscle weakness  (generalized)  Other lack of coordination  Rationale for Evaluation and Treatment: Rehabilitation  SUBJECTIVE: Pt reports feeling very fatigued following PT session today. SUBJECTIVE STATEMENT: Pt reports no more soreness from a recent fall.  PERTINENT HISTORY: Pt reports 2-3 CVAs in Aug of 2023, but a total of 5 CVAs over the course of 4 years.  After the strokes in Aug, pt participated in [facility] therapy for 2-3 months.  Pt reports that he had been able to return to work up until 05/20/22.  Pt reported that his work felt like he was exhibiting stroke symptoms, so he went to the ED.  Per chart from ED visit on 05/20/22:     Clinical Course as of 05/20/22 1559  Mon May 20, 2022  1505 Prior CVA with soft blood pressures - reactivation of old CVA.  CTA negative. Code stroke and if MRI negative then will dc home.  [SM]     Clinical Course User Index [SM] [name], MD  MRI negative.  Patient states that he is feeling better.  Blood pressure improved while in the emergency department.  Last blood pressure [id number] systolic.  Patient discharged home in stable condition with discussion to follow-up as an outpatient and return precautions given.   [name], MD 05/20/22 1733     PRECAUTIONS: fall  WEIGHT BEARING RESTRICTIONS: No  PAIN:  Are you having pain? Yes: NPRS scale: 6/10 Pain location: R shoulder, L shoulder, knees, low back (arthritic) Pain description: achy Aggravating factors: cold weather, walking, stairs Relieving factors: rest, elevation  FALLS: Has patient fallen in last 6 months? Yes. 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  PATIENT GOALS: To get the L side stronger and return to work.   OBJECTIVE:   HAND DOMINANCE: Right  ADLs: Overall ADLs: modified indep with primarily use of the R arm. Transfers/ambulation related to ADLs: uses the walker outside, indoors no walker Eating: sister cuts food  Grooming: indep UB Dressing: extra time to manage clothing fasteners LB Dressing: slip on shoes; tucks laces into shoes Toileting: indep Bathing: modified indep Tub Shower transfers: walk in shower Equipment: see above  IADLs: Shopping: pt can accompany sister and pt pushes the cart Light housekeeping: shared task between pt and sister  Meal Prep: some difficulty opening new packages/containers but can manage simple light meal prep/microwavable meals  Community mobility: able to drive, limited ambulator d/t obesity, arthritic knees, and  L sided weakness Medication management: sister sets up pills into weekly pill organizer  Financial management: pt manages  Handwriting: No issues (pt is R hand dominant)  POSTURE COMMENTS:  rounded shoulders  ACTIVITY TOLERANCE: Activity tolerance: fair; limited community ambulator  FUNCTIONAL OUTCOME MEASURES: FOTO: 52; predicted 55  UPPER EXTREMITY ROM:    Active ROM Right Eval WNL Left eval  Shoulder flexion  98 (limited by pain)  Shoulder abduction  102  Shoulder adduction    Shoulder extension    Shoulder internal rotation    Shoulder external rotation    Elbow flexion    Elbow extension    Wrist flexion    Wrist extension    Wrist ulnar deviation    Wrist radial deviation    Wrist pronation    Wrist supination    (Blank rows = not tested)  UPPER EXTREMITY MMT:     MMT Right eval Left eval  Shoulder flexion 4+ NT (pain)  Shoulder abduction 4 NT (pain)  Shoulder adduction    Shoulder extension    Shoulder internal rotation    Shoulder  external rotation    Middle trapezius    Lower trapezius    Elbow flexion 5 4-  Elbow extension 5 4-  Wrist flexion 5 3+  Wrist extension 5 3+  Wrist ulnar deviation    Wrist radial deviation    Wrist pronation    Wrist supination    (Blank rows = not tested)  HAND FUNCTION: Grip strength: Right: 71 lbs; Left: 27 lbs, Lateral pinch: Right: 16 lbs, Left: 6 lbs, and 3 point pinch: Right: 18 lbs, Left: 8 lbs  COORDINATION: Finger Nose Finger test: L sided good accuracy but slower  9 Hole Peg test: Right: 38 sec; Left: 46 sec Moderate difficulty opposing L hand digits to thumb   SENSATION: Light touch: Impaired , pt reports some L hand numbness ulnar nerve distribution volar and dorsal aspect, does not go up the arm  EDEMA: none  MUSCLE TONE: normal  COGNITION: Overall cognitive status: WFL for tasks assessed  VISION: Subjective report: 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, wears reading glasses all the time and reports increased blurred vision in the R eye since strokes in Aug of 2023  VISION ASSESSMENT: Tracking/Visual pursuits: impaired Saccades: impaired Visual Fields: pt limited in all visual fields of R eye (chart notes floaters both eyes)  Patient has difficulty with following activities due to following visual impairments: pt does not drive  PERCEPTION: WFL  PRAXIS: Impaired: Motor planning  TODAY'S TREATMENT:                                                                                                                              Therapeutic Exercise: Facilitated pinch strengthening with use of therapy resistant clothespins to target lateral and 3 point pinch of L hand.  Able to pinch all colors but blue and black for both pinch  types this date.  Pt completed 1 trial of each pinch type, requiring initial min tactile cues to formulate a lateral pinch.  Facilitated hand strengthening with use of hand gripper set at 11.2# # to remove jumbo pegs from pegboard  x2 trials using L hand.  Pt required rest between sets and intermittent min A to adjust hand gripper in hand to engage the L SF with each gripping rep.  Pt completed a dowel climb with vertical dowel on table top, alternating hands to climb dowel for bilat shoulder flexion stretch; pt completed 3 reps up/down.  Self Care: Participated in L hand coin manipulation activities with coin pick up from table, storage of coins within palm of hand, transferring coins from palm of hand to fingertips to enable discarding coins one at a time without dropping.  Pt pushed coins through a resistive slotted bank with the L hand while the R hand stabilized the bank.     PATIENT EDUCATION: Education details: LUE strengthening and coordination training Person educated: Patient Education method: Explanation and Verbal cues Education comprehension: verbalized understanding  HOME EXERCISE PROGRAM: Theraputty  GOALS: Goals reviewed with patient? Yes  SHORT TERM GOALS: Target date: 07/11/22 (6 weeks)  Pt will be indep to perform HEP for increasing strength and coordination throughout the LUE. Baseline: Eval: issued putty exercises Goal status: INITIAL   LONG TERM GOALS: Target date: 08/22/22 (12 weeks)  Pt will increase FOTO score to 55 or better to indicate clinically relevant improvement in self perceived use of L arm to perform daily tasks.  Baseline: Eval: 52 Goal status: INITIAL  2.  Pt will increase L grip strength by 10 or more lbs in order to hold and carry ADL supplies in L hand. Baseline: Eval: L grip 27 lbs (R 71 lbs) Goal status: INITIAL  3.  Pt will increase LUE strength at the elbow and wrist in order to carry a heavy laundry basket with BUEs. Baseline: L elbow flex 4-, wrist flex 3+ Goal status: INITIAL  4.  Pt will increase L lateral pinch strength in order to ease ability to open small bottles and food packages.  Baseline: L 6 lbs (R 16 lbs) Goal status: INITIAL  5.  Pt will  increase L FMC/dexterity skills to ease ability to manipulate small ADL supplies.  Baseline: L 9 hole 48 sec (R 36 sec); extra time to manage clothing fasteners and to pick up pills Goal status: INITIAL   ASSESSMENT: CLINICAL IMPRESSION: Pt reporting increased fatigue today following PT visit which was completed just prior to OT session.  Pt noting 6/10 arthritic pain fairly widespread in bilat shoulders, low back, and bilat knees.  Pt declined moist heat to any of these areas.  Avoided green theraband exercises this date d/t pain and fatigue, and rather focused on L hand strength and dexterity skills.  Pt tolerated all exercises and activities fair today, but did require more frequent rest breaks d/t pain and fatigue.  Pt was not able to manage the blue clips with a L lateral pinch today, as he had the previous session.  Continue to work towards goals in plan of care to maximize safety and independence in necessary daily tasks.    PERFORMANCE DEFICITS: in functional skills including ADLs, IADLs, coordination, dexterity, sensation, strength, pain, Fine motor control, Gross motor control, mobility, balance, body mechanics, endurance, decreased knowledge of use of DME, vision, and UE functional use, and psychosocial skills including coping strategies, environmental adaptation, habits, and routines and behaviors.  IMPAIRMENTS: are limiting patient from ADLs and IADLs.   CO-MORBIDITIES: has co-morbidities such as legally blind L eye, obesity, arthritis  that affects occupational performance. Patient will benefit from skilled OT to address above impairments and improve overall function.  MODIFICATION OR ASSISTANCE TO COMPLETE EVALUATION: No modification of tasks or assist necessary to complete an evaluation.  OT OCCUPATIONAL PROFILE AND HISTORY: Problem focused assessment: Including review of records relating to presenting problem.  CLINICAL DECISION MAKING: Moderate - several treatment options,  min-mod task modification necessary  REHAB POTENTIAL: Good  EVALUATION COMPLEXITY: Moderate  PLAN:  OT FREQUENCY: 2x/week  OT DURATION: 12 weeks  PLANNED INTERVENTIONS: self care/ADL training, therapeutic exercise, therapeutic activity, neuromuscular re-education, manual therapy, passive range of motion, balance training, functional mobility training, moist heat, cryotherapy, contrast bath, patient/family education, energy conservation, coping strategies training, and DME and/or AE instructions  RECOMMENDED OTHER SERVICES: None at this time  CONSULTED AND AGREED WITH PLAN OF CARE: Patient  PLAN FOR NEXT SESSION: see above  Leta Speller, MS, OTR/L

## 2022-06-27 NOTE — Telephone Encounter (Signed)
They are also asking for Carvedilol but It looks like that comes from Cardio

## 2022-06-28 ENCOUNTER — Telehealth: Payer: Self-pay | Admitting: Physician Assistant

## 2022-06-28 MED ORDER — BUSPIRONE HCL 7.5 MG PO TABS: 7.5000 mg | ORAL_TABLET | Freq: Two times a day (BID) | ORAL | 3 refills | Status: DC

## 2022-06-28 NOTE — Telephone Encounter (Signed)
Please, let him know that his BP was low at his last visit, he needs to measure his BP and if it is low, he needs to hold his coreg medication. If it is high , he needs to continue taking coreg daily as before.

## 2022-06-28 NOTE — Telephone Encounter (Signed)
Please, let pt know that he needs to contact his cardiology for coreg refill He might need to schedule an appt with Cardiology

## 2022-07-02 ENCOUNTER — Ambulatory Visit: Payer: Medicaid Other

## 2022-07-04 ENCOUNTER — Ambulatory Visit: Payer: Medicaid Other | Attending: Physician Assistant | Admitting: Physical Therapy

## 2022-07-04 ENCOUNTER — Ambulatory Visit: Payer: Medicaid Other

## 2022-07-04 DIAGNOSIS — R262 Difficulty in walking, not elsewhere classified: Secondary | ICD-10-CM | POA: Diagnosis not present

## 2022-07-04 DIAGNOSIS — R2681 Unsteadiness on feet: Secondary | ICD-10-CM

## 2022-07-04 DIAGNOSIS — M6281 Muscle weakness (generalized): Secondary | ICD-10-CM

## 2022-07-04 DIAGNOSIS — R278 Other lack of coordination: Secondary | ICD-10-CM

## 2022-07-04 NOTE — Therapy (Signed)
OUTPATIENT PHYSICAL THERAPY NEURO TREATMENT   Patient Name: Jeremiah Vance MRN: [id number] DOB:Aug 29, 1970, 52 y.o., male Today's Date: 07/04/2022   PCP: [name] PA REFERRING PROVIDER: [name] PA  END OF SESSION:  PT End of Session - 07/04/22 0856     Visit Number 8    Number of Visits 24    Date for PT Re-Evaluation 08/22/22    Progress Note Due on Visit 10    PT Start Time [place] During Treatment Gait belt    Activity Tolerance Patient tolerated treatment well;Patient limited by pain    Behavior During Therapy [facility] for tasks assessed/performed                  Past Medical History:  Diagnosis Date   Acute ischemic stroke ([place]) 09/06/2017   Acute renal failure superimposed on stage 3a chronic kidney disease ([place]) 07/08/2019   Acute right-sided weakness    Allergies    Anasarca    Anemia 07/10/2017   Arthritis    CHF (congestive heart failure) ([place])    "coinsided w/kidney problems I was having 06/2017"   Chicken pox    CKD (chronic kidney disease) stage 3, GFR 30-59 ml/min ([place]) 06/2017   Depression    Elevated troponin 07/08/2019   High cholesterol    History of cardiomyopathy    LVEF 40 to 45% in April 2019 - subsequently normalized   Hyperbilirubinemia 07/10/2017   Hypertension    Ischemic stroke ([place])    Small left internal capsule infarct due to lacunar disease   Morbid obesity ([place])    Normocytic anemia 12/16/2017   Recurrent incisional hernia with incarceration s/p repair 10/22/2017 10/21/2017   Stroke ([place]) 08/2017   "right sided weakness since; getting stronger though" (10/22/2017)   Type 2 diabetes mellitus ([place])    Past Surgical History:  Procedure Laterality Date   ABDOMINAL HERNIA REPAIR  2008; 10/22/2017   "scope; OPEN REPAIR INCARCERATED VENTRAL HERNIA   HERNIA REPAIR     KNEE ARTHROSCOPY Right 1989   VENTRAL HERNIA REPAIR N/A 10/22/2017   Procedure: OPEN REPAIR INCARCERATED VENTRAL HERNIA;  Surgeon: Excell Seltzer, MD;  Location: [facility];  Service: General;  Laterality: N/A;   Patient Active Problem List   Diagnosis Date Noted   Dysarthria 05/20/2022   Colon cancer screening 03/20/2022   Need for diphtheria-tetanus-pertussis (Tdap) vaccine 03/20/2022   Acute CVA (cerebrovascular accident) 01/16/2022   Type 2 diabetes mellitus without complication, with long-term current use of insulin 01/16/2022   Dyslipidemia 01/16/2022   Chronic diastolic CHF (congestive heart failure) 01/16/2022   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    Intracranial hemorrhage    HFrEF (heart failure with reduced ejection fraction)    Hypertensive emergency 11/22/2021   Obesity, Class III, BMI 40-49.9 (morbid obesity) 05/22/2020   Cough    Other cirrhosis of liver    Ventral hernia without obstruction or gangrene    Acute CHF (congestive heart failure) 02/20/2020   Shortness of breath    Hypertensive urgency    Neck pain    Prostate cancer screening 01/21/2020   Lymphedema of both lower extremities 10/17/2019   History of stroke 10/17/2019   CKD (chronic kidney disease) stage 3, GFR 30-59 ml/min 08/31/2019   Swelling of limb 08/31/2019   DM (diabetes mellitus), type 2 with complications [id number]   Acute kidney injury superimposed on CKD 07/08/2019   Chest pain 07/08/2019   Vitreous floaters  of both eyes 06/03/2018   Moderate obstructive sleep apnea 02/11/2018   Chronic fatigue 12/16/2017   Essential hypertension 12/16/2017   History of small bowel obstruction 10/24/2017   Adjustment disorder with depressed mood 09/30/2017   Stroke 09/14/2017   Acute on chronic heart failure with preserved ejection fraction (HFpEF)    Hyperlipidemia    Gout    Elevated LFTs    Heart failure with preserved ejection fraction    Morbid obesity    Hypoalbuminemia 07/11/2017   Type 2 diabetes mellitus with hyperglycemia 07/10/2017    ONSET DATE: 05/20/22  REFERRING DIAG: Left sided  weakness   THERAPY DIAG:  Muscle weakness (generalized)  Other lack of coordination  Unsteadiness on feet  Difficulty in walking, not elsewhere classified  Rationale for Evaluation and Treatment: Rehabilitation  SUBJECTIVE:                                                                                                                                                                                             SUBJECTIVE STATEMENT: Pt reports near fall Monday when trying to pick up something from floor. States that he hyperextended the LLE, causing him to  missed therapy on Tuesday due to pain. Pt reports that pain has decreased to 5-6/10, but still feeling unsteady, so he is using RW for safety to reduce pain. Pt also report that sister in hospital due to pain, and need    Pt accompanied by: self  PERTINENT HISTORY:   Patient presents for Left sided weakness. Patient presented to [facility] on 05/20/22 for L sided weakness and slurred speech.MRI negative.  PMH includes CVA (2019, 2023), obesity, Type II DM, HTN, HLD, lymphedema of BLE, Stage IIIa CKD, anemia, depression, hernia,arthritis, CHF. Patient had multiple strokes in  fall 2023 and received home health. 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 Patient has not worked the past two weeks due to another incident 2-3 weeks ago. Prior to stroke was independent, use a walker occasionally now.   PAIN:  Are you having pain? Yes: NPRS scale: 8/10 Pain location: R lower leg Pain description: aching Aggravating factors: Fall  Relieving factors: sitting down, keeping legs raised, hot shower  PRECAUTIONS: Fall  WEIGHT BEARING RESTRICTIONS: No  FALLS: Has patient fallen in last 6 months? Yes. Number of falls 1  LIVING ENVIRONMENT: Lives with: lives with their family Lives in: House/apartment Stairs: Yes: Internal: flight steps; on right going up Has following equipment at home: Gilford Rile - 2 wheeled  PLOF: Independent  PATIENT GOALS: to  get stronger  OBJECTIVE:   DIAGNOSTIC FINDINGS:   MRI: IMPRESSION: 1.  No evidence of acute abnormality. 2. Prior hemorrhages, prior lacunar infarcts, and prior chronic microvascular ischemic disease which is advanced for patient age. Findings could be in part secondary to chronic hypertension.  CTA:   1. No emergent large vessel occlusion. 2. 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 3. Multifocal irregularity and moderate narrowing of the right vertebral artery, mildly progressed.  COGNITION: Overall cognitive status:  short term memory issues s/p stroke.    SENSATION: Light touch: Impaired LLE  COORDINATION: LLE coordination impaired with delay and limited coordination.   EDEMA:  Bilateral lymphedema of LE's   POSTURE: rounded shoulders, forward head, and flexed trunk    LOWER EXTREMITY MMT:    MMT Right Eval Left Eval  Hip flexion 3 3  Hip extension    Hip abduction 4- 3+  Hip adduction 4- 3+  Hip internal rotation    Hip external rotation    Knee flexion 3+ 3  Knee extension 3+ 3  Ankle dorsiflexion 4- 3-  Ankle plantarflexion 4- 3+  Ankle inversion    Ankle eversion    (Blank rows = not tested)   TRANSFERS: Assistive device utilized: None  Sit to stand: CGA Stand to sit: CGA Chair to chair: CGA    GAIT: Gait pattern: step through pattern, decreased stride length, antalgic, wide BOS, poor foot clearance- Right, and poor foot clearance- Left Distance walked: 40 ft Assistive device utilized: None Level of assistance: CGA Comments: Patient is unsteady without AD, has antalgic gait pattern with R knee however has limited strength for weightbearing on LLE.   FUNCTIONAL TESTS:  5 times sit to stand: 41 seconds  10 meter walk test: 21 seconds increased knee pain 6/10 Berg Balance Scale: 14/56  PATIENT SURVEYS:  FOTO 44  TODAY'S TREATMENT:                                                                                                                               DATE: 07/04/22    TherEx:  Nustep seat position 9; BUE/LE - Level 2 x3 minutes for mm strength and cardiovascular response  -Seated hip march 2.5# on RLE< AROM on the LLE  x 10 reps  (slow with left LE)   - Seated hip flex/add/abd up/over 1/2 spike ball x 10 reps each (patient reported as fatiguing) 2.5# on the RLE  -Seated LAQ with 3 second holds, 10x each LE, cueing for muscle activation and sequencing for neutral alignment- difficulty with left LE weakness - able to complete. 2.5# on the RLE, AROM on the LLE due to pain when weight applied  -Adduction bolster squeeze 12x with 3 sec hold  Forward step over half foam roller 8x each side mild pain in the L knee on 7th rep.   Side stepping in parallel bars x 3 bil with cues for posture and use of UE support to protect the LLE on this day.   Ambulate approx 80 feet x2  with W for safety and pain management with cues for safety awareness, posture- look ahead and VC for heel strike as able - all with CGA  Sit<>stand from arm chair x 5 with UE supported on parallel bars for pain control.   Pt required occasional rest breaks due fatigue, but noted to have improved endurance on this day compared to prior sessions.    PATIENT EDUCATION: Education details: goals, POC, HEP Person educated: Patient Education method: Explanation, Demonstration, Tactile cues, and Verbal cues Education comprehension: verbalized understanding, returned demonstration, verbal cues required, and tactile cues required  HOME EXERCISE PROGRAM: Access Code: [email or web address] URL: [email or web address] Date: 05/30/2022 Prepared by: Janna Arch  Exercises - Leg Extension  - 1 x daily - 7 x weekly - 2 sets - 10 reps - 5 hold - Seated March  - 1 x daily - 7 x weekly - 2 sets - 10 reps - 5 hold - Seated Ankle Circles  - 1 x daily - 7 x weekly - 2 sets - 10 reps - 5 hold  GOALS: Goals reviewed with patient? Yes  SHORT TERM  GOALS: Target date: 06/27/2022    Patient will be independent in home exercise program to improve strength/mobility for better functional independence with ADLs. Baseline: 2/29: HEP given Goal status: INITIAL   LONG TERM GOALS: Target date: 08/22/2022    Patient will increase FOTO score to equal to or greater than  54%   to demonstrate statistically significant improvement in mobility and quality of life.  Baseline: 2/29: 44%  Goal status: INITIAL  2.  Patient (< 41 years old) will complete five times sit to stand test in < 10 seconds indicating an increased LE strength and improved balance. Baseline: 2/29: 41 seconds with significant UE support Goal status: INITIAL  3.  Patient will increase Berg Balance score by > 10 points (24/56) to demonstrate decreased fall risk during functional activities. Baseline: 2/29: 14/56 Goal status: INITIAL  4.  Patient will increase 10 meter walk test to >1.34m/s as to improve gait speed for better community ambulation and to reduce fall risk. Baseline: 2/29: 21 seconds increased knee pain Goal status: INITIAL    ASSESSMENT:  CLINICAL IMPRESSION:  Patient presents with continued ongoing fatigue, but improved from prior session with use of RW. Pain in the LLE from near fall on mandy of this week, limits ability to progress resistance training, but pt able to tolerate body weight exercises throughout session well on the LLE. Patient will continue to benefit from skilled physical therapy intervention to address impairments, improve QOL, and attain therapy goals.    OBJECTIVE IMPAIRMENTS: Abnormal gait, cardiopulmonary status limiting activity, decreased activity tolerance, decreased balance, decreased coordination, decreased endurance, decreased mobility, difficulty walking, decreased strength, increased edema, impaired perceived functional ability, impaired flexibility, impaired sensation, impaired UE functional use, impaired vision/preception,  improper body mechanics, postural dysfunction, obesity, and pain.   ACTIVITY LIMITATIONS: carrying, lifting, bending, sitting, standing, squatting, stairs, transfers, bed mobility, bathing, toileting, dressing, reach over head, hygiene/grooming, locomotion level, and caring for others  PARTICIPATION LIMITATIONS: meal prep, cleaning, laundry, personal finances, driving, shopping, community activity, occupation, yard work, school, and church  PERSONAL FACTORS: Age, Behavior pattern, Education, Restaurant manager, fast food, Past/current experiences, Profession, Social background, Time since onset of injury/illness/exacerbation, Transportation, and 3+ comorbidities: CVA (2019, 2023), obesity, Type II DM, HTN, HLD, lymphedema of BLE, Stage IIIa CKD, anemia, depression, hernia,arthritis, CHF  are also affecting patient's functional outcome.   REHAB POTENTIAL: Good  CLINICAL DECISION MAKING: Evolving/moderate complexity  EVALUATION COMPLEXITY: Moderate  PLAN:  PT FREQUENCY: 2x/week  PT DURATION: 12 weeks  PLANNED INTERVENTIONS: Therapeutic exercises, Therapeutic activity, Neuromuscular re-education, Balance training, Gait training, Patient/Family education, Self Care, Joint mobilization, Joint manipulation, Stair training, Vestibular training, Canalith repositioning, Visual/preceptual remediation/compensation, Orthotic/Fit training, DME instructions, Cognitive remediation, Electrical stimulation, Spinal mobilization, Cryotherapy, Manual lymph drainage, Compression bandaging, Taping, Vasopneumatic device, Traction, Ultrasound, Ionotophoresis 4mg /ml Dexamethasone, Manual therapy, and Re-evaluation  PLAN FOR NEXT SESSION: balance, strength, ambulatory endurance training    Lorie Phenix, PT 07/04/2022, 8:58 AM

## 2022-07-04 NOTE — Therapy (Signed)
[facility] at [facility] [place], [place], [address] Phone: [phone]   Fax:  [phone]  Patient Details  Name: Jeremiah Vance MRN: [id number] Date of Birth: 01-22-1971 Referring Provider:  [name], PA-C  Encounter Date: 07/04/2022   Lorie Phenix, PT 07/04/2022, 8:57 AM  [facility] at [facility] [place], [place], [address] Phone: [phone]   Fax:  [phone]

## 2022-07-04 NOTE — Therapy (Signed)
OUTPATIENT OCCUPATIONAL THERAPY NEURO TREATMENT NOTE  Patient Name: Jeremiah Vance MRN: [id number] DOB:[birth year], 52 y.o., male Today's Date: 07/04/2022  PCP: [name], PA-C REFERRING PROVIDER: [name], PA-C   OT End of Session - 07/04/22 1333     Visit Number 8    Number of Visits 24    Date for OT Re-Evaluation 08/22/22    Progress Note Due on Visit 10    OT Start Time 0930    OT Stop Time 1015    OT Time Calculation (min) 45 min    Equipment Utilized During Treatment transport chair    Activity Tolerance Patient tolerated treatment well    Behavior During Therapy [facility] for tasks assessed/performed               Past Medical History:  Diagnosis Date   Acute ischemic stroke ([place]) 09/06/2017   Acute renal failure superimposed on stage 3a chronic kidney disease ([place]) 07/08/2019   Acute right-sided weakness    Allergies    Anasarca    Anemia 07/10/2017   Arthritis    CHF (congestive heart failure) ([place])    "coinsided w/kidney problems I was having 06/2017"   Chicken pox    CKD (chronic kidney disease) stage 3, GFR 30-59 ml/min ([place]) 06/2017   Depression    Elevated troponin 07/08/2019   High cholesterol    History of cardiomyopathy    LVEF 40 to 45% in April 2019 - subsequently normalized   Hyperbilirubinemia 07/10/2017   Hypertension    Ischemic stroke ([place])    Small left internal capsule infarct due to lacunar disease   Morbid obesity ([place])    Normocytic anemia 12/16/2017   Recurrent incisional hernia with incarceration s/p repair 10/22/2017 10/21/2017   Stroke ([place]) 08/2017   "right sided weakness since; getting stronger though" (10/22/2017)   Type 2 diabetes mellitus ([place])    Past Surgical History:  Procedure Laterality Date   ABDOMINAL HERNIA REPAIR  2008; 10/22/2017   "scope; OPEN REPAIR INCARCERATED VENTRAL HERNIA   HERNIA REPAIR     KNEE ARTHROSCOPY Right 1989   VENTRAL HERNIA REPAIR N/A 10/22/2017   Procedure: OPEN REPAIR INCARCERATED VENTRAL  HERNIA;  Surgeon: Excell Seltzer, MD;  Location: [facility];  Service: General;  Laterality: N/A;   Patient Active Problem List   Diagnosis Date Noted   Dysarthria 05/20/2022   Colon cancer screening 03/20/2022   Need for diphtheria-tetanus-pertussis (Tdap) vaccine 03/20/2022   Acute CVA (cerebrovascular accident) 01/16/2022   Type 2 diabetes mellitus without complication, with long-term current use of insulin 01/16/2022   Dyslipidemia 01/16/2022   Chronic diastolic CHF (congestive heart failure) 01/16/2022   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    Intracranial hemorrhage    HFrEF (heart failure with reduced ejection fraction)    Hypertensive emergency 11/22/2021   Obesity, Class III, BMI 40-49.9 (morbid obesity) 05/22/2020   Cough    Other cirrhosis of liver    Ventral hernia without obstruction or gangrene    Acute CHF (congestive heart failure) 02/20/2020   Shortness of breath    Hypertensive urgency    Neck pain    Prostate cancer screening 01/21/2020   Lymphedema of both lower extremities 10/17/2019   History of stroke 10/17/2019   CKD (chronic kidney disease) stage 3, GFR 30-59 ml/min 08/31/2019   Swelling of limb 08/31/2019   DM (diabetes mellitus), type 2 with complications [id number]   Acute kidney injury superimposed on CKD 07/08/2019   Chest  pain 07/08/2019   Vitreous floaters of both eyes 06/03/2018   Moderate obstructive sleep apnea 02/11/2018   Chronic fatigue 12/16/2017   Essential hypertension 12/16/2017   History of small bowel obstruction 10/24/2017   Adjustment disorder with depressed mood 09/30/2017   Stroke 09/14/2017   Acute on chronic heart failure with preserved ejection fraction (HFpEF)    Hyperlipidemia    Gout    Elevated LFTs    Heart failure with preserved ejection fraction    Morbid obesity    Hypoalbuminemia 07/11/2017   Type 2 diabetes mellitus with hyperglycemia 07/10/2017   ONSET DATE: Aug  2023  REFERRING DIAG: L sided weakness  THERAPY DIAG:  Muscle weakness (generalized)  Other lack of coordination  Rationale for Evaluation and Treatment: Rehabilitation  SUBJECTIVE: Pt reports missing his first therapy session this week d/t hyperextending his L knee the other day.  Pt reports doing better today but feeling sore all over d/t weather changes.  SUBJECTIVE STATEMENT: Pt reports no more soreness from a recent fall.  PERTINENT HISTORY: Pt reports 2-3 CVAs in Aug of 2023, but a total of 5 CVAs over the course of 4 years.  After the strokes in Aug, pt participated in [facility] therapy for 2-3 months.  Pt reports that he had been able to return to work up until 05/20/22.  Pt reported that his work felt like he was exhibiting stroke symptoms, so he went to the ED.  Per chart from ED visit on 05/20/22:     Clinical Course as of 05/20/22 1559  Mon May 20, 2022  1505 Prior CVA with soft blood pressures - reactivation of old CVA.  CTA negative. Code stroke and if MRI negative then will dc home.  [SM]     Clinical Course User Index [SM] [name], MD  MRI negative.  Patient states that he is feeling better.  Blood pressure improved while in the emergency department.  Last blood pressure [id number] systolic.  Patient discharged home in stable condition with discussion to follow-up as an outpatient and return precautions given.   [name], MD 05/20/22 1733     PRECAUTIONS: fall  WEIGHT BEARING RESTRICTIONS: No  PAIN:  Are you having pain? Yes: NPRS scale: 6/10 Pain location: R shoulder, L shoulder, knees, low back (arthritic) Pain description: achy Aggravating factors: cold weather, walking, stairs Relieving factors: rest, elevation  FALLS: Has patient fallen in last 6 months? Yes. 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  PATIENT GOALS: To get the L side stronger and return to work.   OBJECTIVE:   HAND DOMINANCE: Right  ADLs: Overall ADLs: modified indep with primarily use of the R arm. Transfers/ambulation related to ADLs: uses the walker outside, indoors no walker Eating: sister cuts food  Grooming: indep UB Dressing: extra time to manage clothing fasteners LB Dressing: slip on shoes; tucks laces into shoes Toileting: indep Bathing: modified indep Tub Shower transfers: walk in shower Equipment: see above  IADLs: Shopping: pt can accompany sister and pt pushes the cart  Light housekeeping: shared task between pt and sister  Meal Prep: some difficulty opening new packages/containers but can manage simple light meal prep/microwavable meals  Community mobility: able to drive, limited ambulator d/t obesity, arthritic knees, and L sided weakness Medication management: sister sets up pills into weekly pill organizer  Financial management: pt manages  Handwriting: No issues (pt is R hand dominant)  POSTURE COMMENTS:  rounded shoulders  ACTIVITY TOLERANCE: Activity tolerance: fair; limited community ambulator  FUNCTIONAL OUTCOME MEASURES: FOTO: 52; predicted 55  UPPER EXTREMITY ROM:    Active ROM Right Eval WNL Left eval  Shoulder flexion  98 (limited by pain)  Shoulder abduction  102  Shoulder adduction    Shoulder extension    Shoulder internal rotation    Shoulder external rotation    Elbow flexion    Elbow extension    Wrist flexion    Wrist extension    Wrist ulnar deviation    Wrist radial deviation    Wrist pronation    Wrist supination    (Blank rows = not tested)  UPPER  EXTREMITY MMT:     MMT Right eval Left eval  Shoulder flexion 4+ NT (pain)  Shoulder abduction 4 NT (pain)  Shoulder adduction    Shoulder extension    Shoulder internal rotation    Shoulder external rotation    Middle trapezius    Lower trapezius    Elbow flexion 5 4-  Elbow extension 5 4-  Wrist flexion 5 3+  Wrist extension 5 3+  Wrist ulnar deviation    Wrist radial deviation    Wrist pronation    Wrist supination    (Blank rows = not tested)  HAND FUNCTION: Grip strength: Right: 71 lbs; Left: 27 lbs, Lateral pinch: Right: 16 lbs, Left: 6 lbs, and 3 point pinch: Right: 18 lbs, Left: 8 lbs  COORDINATION: Finger Nose Finger test: L sided good accuracy but slower  9 Hole Peg test: Right: 38 sec; Left: 46 sec Moderate difficulty opposing L hand digits to thumb   SENSATION: Light touch: Impaired , pt reports some L hand numbness ulnar nerve distribution volar and dorsal aspect, does not go up the arm  EDEMA: none  MUSCLE TONE: normal  COGNITION: Overall cognitive status: WFL for tasks assessed  VISION: Subjective report: 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, wears reading glasses all the time and reports increased blurred vision in the R eye since strokes in Aug of 2023  VISION ASSESSMENT: Tracking/Visual pursuits: impaired Saccades: impaired Visual Fields: pt limited in all visual fields of R eye (chart notes floaters both eyes)  Patient has difficulty with following activities due to following visual impairments: pt does not drive  PERCEPTION: WFL  PRAXIS: Impaired: Motor planning  TODAY'S TREATMENT:  Therapeutic Exercise: Facilitated hand strengthening with use of hand gripper set at 11.2# to remove jumbo pegs from pegboard x3 trials using L hand.  Pt required rest between sets and min vc to adjust hand gripper in hand to engage  the L SF with each gripping rep.  Pt completed a dowel climb with vertical dowel on table top, alternating hands to climb dowel for bilat shoulder flexion stretch; pt completed 5 reps up/down.  Pt performed additional LUE strengthening with use of green theraband to perform L elbow flex, elbow extension, wrist flexion, and wrist extension.  Pt completed 3 sets 10 reps each.  Printed handout in large print, but pt still struggled to view the pictures, so reviewed exercises with use of access code which directed pt to the video instructions for each exercise.  With the video, pt required mod vc and tactile cues to achor the band correctly and to facilitate correct movement patterns for first 2 sets, and min vc and tactile cues for the 3rd set.    PATIENT EDUCATION: Education details: LUE strengthening Person educated: Patient Education method: Explanation and Verbal cues Education comprehension: verbalized understanding  HOME EXERCISE PROGRAM: Theraputty, theraband  GOALS: Goals reviewed with patient? Yes  SHORT TERM GOALS: Target date: 07/11/22 (6 weeks)  Pt will be indep to perform HEP for increasing strength and coordination throughout the LUE. Baseline: Eval: issued putty exercises Goal status: INITIAL   LONG TERM GOALS: Target date: 08/22/22 (12 weeks)  Pt will increase FOTO score to 55 or better to indicate clinically relevant improvement in self perceived use of L arm to perform daily tasks.  Baseline: Eval: 52 Goal status: INITIAL  2.  Pt will increase L grip strength by 10 or more lbs in order to hold and carry ADL supplies in L hand. Baseline: Eval: L grip 27 lbs (R 71 lbs) Goal status: INITIAL  3.  Pt will increase LUE strength at the elbow and wrist in order to carry a heavy laundry basket with BUEs. Baseline: L elbow flex 4-, wrist flex 3+ Goal status: INITIAL  4.  Pt will increase L lateral pinch strength in order to ease ability to open small bottles and food packages.   Baseline: L 6 lbs (R 16 lbs) Goal status: INITIAL  5.  Pt will increase L FMC/dexterity skills to ease ability to manipulate small ADL supplies.  Baseline: L 9 hole 48 sec (R 36 sec); extra time to manage clothing fasteners and to pick up pills Goal status: INITIAL   ASSESSMENT: CLINICAL IMPRESSION: Pt reports missing his first therapy session this week d/t hyperextending his L knee the other day.  Pt reports doing better today but feeling sore all over d/t weather changes.  Despite soreness, pt tolerated therapeutic exercises well this date given rest breaks between sets.  Pt making steady progress with grip strengthening exercises, noting fewer dropped pegs with use of the hand gripper today to remove jumbo pegs from pegboard.  Pt states that he feels like he's able to grip items better at home.  Reviewed green theraband exercises for L elbow and wrist strengthening.  Low vision limited ability to read large print handout of exercises, but pt was guided through use of the access code to follow the video instructions for each exercise.  Pt required mod vc and tactile cues to achor the band correctly and to facilitate correct movement patterns for first 2 sets, and min vc and tactile cues for the 3rd set.  Further  review needed to maximize indep with HEP.  Continue to work towards goals in plan of care to maximize safety and independence in necessary daily tasks.    PERFORMANCE DEFICITS: in functional skills including ADLs, IADLs, coordination, dexterity, sensation, strength, pain, Fine motor control, Gross motor control, mobility, balance, body mechanics, endurance, decreased knowledge of use of DME, vision, and UE functional use, and psychosocial skills including coping strategies, environmental adaptation, habits, and routines and behaviors.   IMPAIRMENTS: are limiting patient from ADLs and IADLs.   CO-MORBIDITIES: has co-morbidities such as legally blind L eye, obesity, arthritis  that affects  occupational performance. Patient will benefit from skilled OT to address above impairments and improve overall function.  MODIFICATION OR ASSISTANCE TO COMPLETE EVALUATION: No modification of tasks or assist necessary to complete an evaluation.  OT OCCUPATIONAL PROFILE AND HISTORY: Problem focused assessment: Including review of records relating to presenting problem.  CLINICAL DECISION MAKING: Moderate - several treatment options, min-mod task modification necessary  REHAB POTENTIAL: Good  EVALUATION COMPLEXITY: Moderate  PLAN:  OT FREQUENCY: 2x/week  OT DURATION: 12 weeks  PLANNED INTERVENTIONS: self care/ADL training, therapeutic exercise, therapeutic activity, neuromuscular re-education, manual therapy, passive range of motion, balance training, functional mobility training, moist heat, cryotherapy, contrast bath, patient/family education, energy conservation, coping strategies training, and DME and/or AE instructions  RECOMMENDED OTHER SERVICES: None at this time  CONSULTED AND AGREED WITH PLAN OF CARE: Patient  PLAN FOR NEXT SESSION: see above  Leta Speller, MS, OTR/L

## 2022-07-09 ENCOUNTER — Ambulatory Visit: Payer: Medicaid Other | Admitting: Occupational Therapy

## 2022-07-09 ENCOUNTER — Ambulatory Visit: Payer: Medicaid Other

## 2022-07-11 ENCOUNTER — Ambulatory Visit: Payer: Medicaid Other | Admitting: Occupational Therapy

## 2022-07-11 ENCOUNTER — Ambulatory Visit: Payer: Medicaid Other | Admitting: Physical Therapy

## 2022-07-11 DIAGNOSIS — R262 Difficulty in walking, not elsewhere classified: Secondary | ICD-10-CM | POA: Diagnosis not present

## 2022-07-11 DIAGNOSIS — R2681 Unsteadiness on feet: Secondary | ICD-10-CM

## 2022-07-11 DIAGNOSIS — M6281 Muscle weakness (generalized): Secondary | ICD-10-CM | POA: Diagnosis not present

## 2022-07-11 DIAGNOSIS — R278 Other lack of coordination: Secondary | ICD-10-CM | POA: Diagnosis not present

## 2022-07-11 NOTE — Therapy (Signed)
OUTPATIENT OCCUPATIONAL THERAPY NEURO TREATMENT NOTE  Patient Name: Jeremiah Vance MRN: [id number] DOB:19-Dec-1970, 52 y.o., male Today's Date: 07/11/2022  PCP: Jeremiah Lat, PA-C REFERRING PROVIDER: Debera Lat, PA-C   OT End of Session - 07/11/22 0908     Visit Number 9    Number of Visits 24    Date for OT Re-Evaluation 08/22/22    OT Start Time 0845    OT Stop Time 0930    OT Time Calculation (min) 45 min    Activity Tolerance Patient tolerated treatment well    Behavior During Therapy [facility] for tasks assessed/performed               Past Medical History:  Diagnosis Date   Acute ischemic stroke (HCC) 09/06/2017   Acute renal failure superimposed on stage 3a chronic kidney disease (HCC) 07/08/2019   Acute right-sided weakness    Allergies    Anasarca    Anemia 07/10/2017   Arthritis    CHF (congestive heart failure) (HCC)    "coinsided w/kidney problems I was having 06/2017"   Chicken pox    CKD (chronic kidney disease) stage 3, GFR 30-59 ml/min (HCC) 06/2017   Depression    Elevated troponin 07/08/2019   High cholesterol    History of cardiomyopathy    LVEF 40 to 45% in April 2019 - subsequently normalized   Hyperbilirubinemia 07/10/2017   Hypertension    Ischemic stroke (HCC)    Small left internal capsule infarct due to lacunar disease   Morbid obesity (HCC)    Normocytic anemia 12/16/2017   Recurrent incisional hernia with incarceration s/p repair 10/22/2017 10/21/2017   Stroke (HCC) 08/2017   "right sided weakness since; getting stronger though" (10/22/2017)   Type 2 diabetes mellitus (HCC)    Past Surgical History:  Procedure Laterality Date   ABDOMINAL HERNIA REPAIR  2008; 10/22/2017   "scope; OPEN REPAIR INCARCERATED VENTRAL HERNIA   HERNIA REPAIR     KNEE ARTHROSCOPY Right 1989   VENTRAL HERNIA REPAIR N/A 10/22/2017   Procedure: OPEN REPAIR INCARCERATED VENTRAL HERNIA;  Surgeon: Jeremiah Fellows, MD;  Location: [facility] OR;  Service: General;  Laterality:  N/A;   Patient Active Problem List   Diagnosis Date Noted   Dysarthria 05/20/2022   Colon cancer screening 03/20/2022   Need for diphtheria-tetanus-pertussis (Tdap) vaccine 03/20/2022   Acute CVA (cerebrovascular accident) 01/16/2022   Type 2 diabetes mellitus without complication, with long-term current use of insulin 01/16/2022   Dyslipidemia 01/16/2022   Chronic diastolic CHF (congestive heart failure) 01/16/2022   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    Intracranial hemorrhage    HFrEF (heart failure with reduced ejection fraction)    Hypertensive emergency 11/22/2021   Obesity, Class III, BMI 40-49.9 (morbid obesity) 05/22/2020   Cough    Other cirrhosis of liver    Ventral hernia without obstruction or gangrene    Acute CHF (congestive heart failure) 02/20/2020   Shortness of breath    Hypertensive urgency    Neck pain    Prostate cancer screening 01/21/2020   Lymphedema of both lower extremities 10/17/2019   History of stroke 10/17/2019   CKD (chronic kidney disease) stage 3, GFR 30-59 ml/min 08/31/2019   Swelling of limb 08/31/2019   DM (diabetes mellitus), type 2 with complications 07/08/2019   Acute kidney injury superimposed on CKD 07/08/2019   Chest pain 07/08/2019   Vitreous floaters of both eyes 06/03/2018   Moderate obstructive sleep apnea 02/11/2018  Chronic fatigue 12/16/2017   Essential hypertension 12/16/2017   History of small bowel obstruction 10/24/2017   Adjustment disorder with depressed mood 09/30/2017   Stroke 09/14/2017   Acute on chronic heart failure with preserved ejection fraction (HFpEF)    Hyperlipidemia    Gout    Elevated LFTs    Heart failure with preserved ejection fraction    Morbid obesity    Hypoalbuminemia 07/11/2017   Type 2 diabetes mellitus with hyperglycemia 07/10/2017   ONSET DATE: Aug 2023  REFERRING DIAG: L sided weakness  THERAPY DIAG:  Muscle weakness (generalized)  Rationale  for Evaluation and Treatment: Rehabilitation  SUBJECTIVE: Pt. Reports soreness from the weather today. SUBJECTIVE STATEMENT: Pt reports no more soreness from a recent fall.  PERTINENT HISTORY: Pt reports 2-3 CVAs in Aug of 2023, but a total of 5 CVAs over the course of 4 years.  After the strokes in Aug, pt participated in [facility] therapy for 2-3 months.  Pt reports that he had been able to return to work up until 05/20/22.  Pt reported that his work felt like he was exhibiting stroke symptoms, so he went to the ED.  Per chart from ED visit on 05/20/22:     Clinical Course as of 05/20/22 1559  Mon May 20, 2022  1505 Prior CVA with soft blood pressures - reactivation of old CVA.  CTA negative. Code stroke and if MRI negative then will dc home.  [SM]     Clinical Course User Index [SM] Jeremiah Herter, MD  MRI negative.  Patient states that he is feeling better.  Blood pressure improved while in the emergency department.  Last blood pressure 130 systolic.  Patient discharged home in stable condition with discussion to follow-up as an outpatient and return precautions given.   Jeremiah Herter, MD 05/20/22 1733     PRECAUTIONS: fall  WEIGHT BEARING RESTRICTIONS: No  PAIN:  Are you having pain? Yes: NPRS scale: 6/10 Pain location: All over pain Pain description: achy Aggravating factors: cold weather, walking, stairs Relieving factors: rest, elevation  FALLS: Has patient fallen in last 6 months? Yes. 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  PATIENT GOALS: To get the L side stronger and return to work.   OBJECTIVE:   HAND DOMINANCE: Right  ADLs: Overall ADLs: modified indep with primarily use of the R arm. Transfers/ambulation related to ADLs: uses the walker outside, indoors no walker Eating: sister cuts food  Grooming: indep UB Dressing: extra time to manage clothing fasteners LB Dressing: slip on shoes; tucks laces into shoes Toileting: indep Bathing: modified indep Tub Shower transfers: walk in shower Equipment: see above  IADLs: Shopping: pt can accompany sister and pt pushes the cart Light housekeeping: shared task between pt and sister  Meal Prep: some difficulty opening new packages/containers but can manage simple light meal prep/microwavable meals  Community mobility: able to drive, limited ambulator d/t obesity, arthritic knees, and L sided weakness Medication management: sister sets up pills into weekly pill organizer  Financial management: pt  manages  Handwriting: No issues (pt is R hand dominant)  POSTURE COMMENTS:  rounded shoulders  ACTIVITY TOLERANCE: Activity tolerance: fair; limited community ambulator  FUNCTIONAL OUTCOME MEASURES: FOTO: 52; predicted 55  UPPER EXTREMITY ROM:    Active ROM Right Eval WNL Left eval  Shoulder flexion  98 (limited by pain)  Shoulder abduction  102  Shoulder adduction    Shoulder extension    Shoulder internal rotation    Shoulder external rotation    Elbow flexion    Elbow extension    Wrist flexion    Wrist extension    Wrist ulnar deviation    Wrist radial deviation    Wrist pronation    Wrist supination    (Blank rows = not tested)  UPPER EXTREMITY MMT:     MMT Right eval Left eval  Shoulder flexion 4+ NT (pain)  Shoulder abduction 4 NT (pain)  Shoulder adduction    Shoulder extension    Shoulder internal rotation    Shoulder external rotation    Middle trapezius    Lower trapezius    Elbow flexion 5 4-  Elbow extension  5 4-  Wrist flexion 5 3+  Wrist extension 5 3+  Wrist ulnar deviation    Wrist radial deviation    Wrist pronation    Wrist supination    (Blank rows = not tested)  HAND FUNCTION: Grip strength: Right: 71 lbs; Left: 27 lbs, Lateral pinch: Right: 16 lbs, Left: 6 lbs, and 3 point pinch: Right: 18 lbs, Left: 8 lbs  COORDINATION: Finger Nose Finger test: L sided good accuracy but slower  9 Hole Peg test: Right: 38 sec; Left: 46 sec Moderate difficulty opposing L hand digits to thumb   SENSATION: Light touch: Impaired , pt reports some L hand numbness ulnar nerve distribution volar and dorsal aspect, does not go up the arm  EDEMA: none  MUSCLE TONE: normal  COGNITION: Overall cognitive status: WFL for tasks assessed  VISION: Subjective report: 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, wears reading glasses all the time and reports increased blurred vision in the R eye since strokes in Aug of 2023  VISION ASSESSMENT: Tracking/Visual pursuits: impaired Saccades: impaired Visual Fields: pt limited in all visual fields of R eye (chart notes floaters both eyes)  Patient has difficulty with following activities due to following visual impairments: pt does not drive  PERCEPTION: WFL  PRAXIS: Impaired: Motor planning  TODAY'S TREATMENT:                                                                                                                              Therapeutic Exercise: Facilitated hand strengthening with use of hand gripper set at 11.2# to remove jumbo pegs from pegboard x3 trials using L hand.  Pt required rest between sets and min vc to adjust hand gripper in hand to engage the L SF with each gripping rep.  Pt completed  a dowel climb with vertical dowel on table top, alternating hands to climb dowel for bilat shoulder flexion stretch; pt completed 5 reps up/down.  Pt performed additional LUE strengthening with use of green theraband to perform L elbow flex, elbow  extension, wrist flexion, and wrist extension.  Pt. Worked on pinch strengthening in the left hand for lateral, and 3pt. pinch using yellow, red, green, and blue resistive clips. Pt. worked on placing the clips at various vertical and horizontal angles. Tactile and verbal cues were required for eliciting the desired movement. Pt. Worked on moving the clips through his hand from one pinch position to the next. Pt.worked left hand digit flexion using the 1.5# DigiFlex.   PATIENT EDUCATION: Education details: LUE strengthening Person educated: Patient Education method: Explanation and Verbal cues Education comprehension: verbalized understanding  HOME EXERCISE PROGRAM: Theraputty, theraband  GOALS: Goals reviewed with patient? Yes  SHORT TERM GOALS: Target date: 07/11/22 (6 weeks)  Pt will be indep to perform HEP for increasing strength and coordination throughout the LUE. Baseline: Eval: issued putty exercises Goal status: INITIAL   LONG TERM GOALS: Target date: 08/22/22 (12 weeks)  Pt will increase FOTO score to 55 or better to indicate clinically relevant improvement in self perceived use of L arm to perform daily tasks.  Baseline: Eval: 52 Goal status: INITIAL  2.  Pt will increase L grip strength by 10 or more lbs in order to hold and carry ADL supplies in L hand. Baseline: Eval: L grip 27 lbs (R 71 lbs) Goal status: INITIAL  3.  Pt will increase LUE strength at the elbow and wrist in order to carry a heavy laundry basket with BUEs. Baseline: L elbow flex 4-, wrist flex 3+ Goal status: INITIAL  4.  Pt will increase L lateral pinch strength in order to ease ability to open small bottles and food packages.  Baseline: L 6 lbs (R 16 lbs) Goal status: INITIAL  5.  Pt will increase L FMC/dexterity skills to ease ability to manipulate small ADL supplies.  Baseline: L 9 hole 48 sec (R 36 sec); extra time to manage clothing fasteners and to pick up pills Goal status:  INITIAL   ASSESSMENT: CLINICAL IMPRESSION:  Pt. Reports having soreness all over today. Pt. continues to make steady progress with grip strengthening exercises, and did not drop any jumbo pegs from the pegboard.  Pt. was able to engage the left 4th, and 5th digits more on the grip strengthener.  Reviewed green theraband exercises for L elbow and wrist strengthening. Pt. Was able to demonstrate each exercise with consistent visual cues for proper form, and technique.Pt. continues to benefit from further review to maximize independence with HEP.  Continue to work towards goals in plan of care to maximize safety and independence in necessary daily tasks.    PERFORMANCE DEFICITS: in functional skills including ADLs, IADLs, coordination, dexterity, sensation, strength, pain, Fine motor control, Gross motor control, mobility, balance, body mechanics, endurance, decreased knowledge of use of DME, vision, and UE functional use, and psychosocial skills including coping strategies, environmental adaptation, habits, and routines and behaviors.   IMPAIRMENTS: are limiting patient from ADLs and IADLs.   CO-MORBIDITIES: has co-morbidities such as legally blind L eye, obesity, arthritis  that affects occupational performance. Patient will benefit from skilled OT to address above impairments and improve overall function.  MODIFICATION OR ASSISTANCE TO COMPLETE EVALUATION: No modification of tasks or assist necessary to complete an evaluation.  OT OCCUPATIONAL PROFILE AND HISTORY: Problem  focused assessment: Including review of records relating to presenting problem.  CLINICAL DECISION MAKING: Moderate - several treatment options, min-mod task modification necessary  REHAB POTENTIAL: Good  EVALUATION COMPLEXITY: Moderate  PLAN:  OT FREQUENCY: 2x/week  OT DURATION: 12 weeks  PLANNED INTERVENTIONS: self care/ADL training, therapeutic exercise, therapeutic activity, neuromuscular re-education, manual  therapy, passive range of motion, balance training, functional mobility training, moist heat, cryotherapy, contrast bath, patient/family education, energy conservation, coping strategies training, and DME and/or AE instructions  RECOMMENDED OTHER SERVICES: None at this time  CONSULTED AND AGREED WITH PLAN OF CARE: Patient  PLAN FOR NEXT SESSION: see above  Olegario Messier, MS, OTR/L

## 2022-07-11 NOTE — Therapy (Signed)
OUTPATIENT PHYSICAL THERAPY NEURO TREATMENT   Patient Name: Jeremiah Vance MRN: [id number] DOB:08/02/1970, 52 y.o., male Today's Date: 07/11/2022   PCP: Debera Lat PA REFERRING PROVIDER: Debera Lat PA  END OF SESSION:  PT End of Session - 07/11/22 0932     Visit Number 9    Number of Visits 24    Date for PT Re-Evaluation 08/22/22    Progress Note Due on Visit 10    PT Start Time 0931    PT Stop Time 1014    PT Time Calculation (min) 43 min    Equipment Utilized During Treatment Gait belt    Activity Tolerance Patient tolerated treatment well;Patient limited by pain    Behavior During Therapy [facility] for tasks assessed/performed                  Past Medical History:  Diagnosis Date   Acute ischemic stroke (HCC) 09/06/2017   Acute renal failure superimposed on stage 3a chronic kidney disease (HCC) 07/08/2019   Acute right-sided weakness    Allergies    Anasarca    Anemia 07/10/2017   Arthritis    CHF (congestive heart failure) (HCC)    "coinsided w/kidney problems I was having 06/2017"   Chicken pox    CKD (chronic kidney disease) stage 3, GFR 30-59 ml/min (HCC) 06/2017   Depression    Elevated troponin 07/08/2019   High cholesterol    History of cardiomyopathy    LVEF 40 to 45% in April 2019 - subsequently normalized   Hyperbilirubinemia 07/10/2017   Hypertension    Ischemic stroke (HCC)    Small left internal capsule infarct due to lacunar disease   Morbid obesity (HCC)    Normocytic anemia 12/16/2017   Recurrent incisional hernia with incarceration s/p repair 10/22/2017 10/21/2017   Stroke (HCC) 08/2017   "right sided weakness since; getting stronger though" (10/22/2017)   Type 2 diabetes mellitus (HCC)    Past Surgical History:  Procedure Laterality Date   ABDOMINAL HERNIA REPAIR  2008; 10/22/2017   "scope; OPEN REPAIR INCARCERATED VENTRAL HERNIA   HERNIA REPAIR     KNEE ARTHROSCOPY Right 1989   VENTRAL HERNIA REPAIR N/A 10/22/2017   Procedure: OPEN  REPAIR INCARCERATED VENTRAL HERNIA;  Surgeon: Glenna Fellows, MD;  Location: [facility] OR;  Service: General;  Laterality: N/A;   Patient Active Problem List   Diagnosis Date Noted   Dysarthria 05/20/2022   Colon cancer screening 03/20/2022   Need for diphtheria-tetanus-pertussis (Tdap) vaccine 03/20/2022   Acute CVA (cerebrovascular accident) 01/16/2022   Type 2 diabetes mellitus without complication, with long-term current use of insulin 01/16/2022   Dyslipidemia 01/16/2022   Chronic diastolic CHF (congestive heart failure) 01/16/2022   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    Intracranial hemorrhage    HFrEF (heart failure with reduced ejection fraction)    Hypertensive emergency 11/22/2021   Obesity, Class III, BMI 40-49.9 (morbid obesity) 05/22/2020   Cough    Other cirrhosis of liver    Ventral hernia without obstruction or gangrene    Acute CHF (congestive heart failure) 02/20/2020   Shortness of breath    Hypertensive urgency    Neck pain    Prostate cancer screening 01/21/2020   Lymphedema of both lower extremities 10/17/2019   History of stroke 10/17/2019   CKD (chronic kidney disease) stage 3, GFR 30-59 ml/min 08/31/2019   Swelling of limb 08/31/2019   DM (diabetes mellitus), type 2 with complications 07/08/2019  Acute kidney injury superimposed on CKD 07/08/2019   Chest pain 07/08/2019   Vitreous floaters of both eyes 06/03/2018   Moderate obstructive sleep apnea 02/11/2018   Chronic fatigue 12/16/2017   Essential hypertension 12/16/2017   History of small bowel obstruction 10/24/2017   Adjustment disorder with depressed mood 09/30/2017   Stroke 09/14/2017   Acute on chronic heart failure with preserved ejection fraction (HFpEF)    Hyperlipidemia    Gout    Elevated LFTs    Heart failure with preserved ejection fraction    Morbid obesity    Hypoalbuminemia 07/11/2017   Type 2 diabetes mellitus with hyperglycemia 07/10/2017     ONSET DATE: 05/20/22  REFERRING DIAG: Left sided weakness   THERAPY DIAG:  Muscle weakness (generalized)  Unsteadiness on feet  Difficulty in walking, not elsewhere classified  Rationale for Evaluation and Treatment: Rehabilitation  SUBJECTIVE:                                                                                                                                                                                             SUBJECTIVE STATEMENT: Patient reports he has been doing well with no significant changes since previous session.   Pt accompanied by: self  PERTINENT HISTORY:   Patient presents for Left sided weakness. Patient presented to [facility] on 05/20/22 for L sided weakness and slurred speech.MRI negative.  PMH includes CVA (2019, 2023), obesity, Type II DM, HTN, HLD, lymphedema of BLE, Stage IIIa CKD, anemia, depression, hernia,arthritis, CHF. Patient had multiple strokes in  fall 2023 and received home health. 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 Patient has not worked the past two weeks due to another incident 2-3 weeks ago. Prior to stroke was independent, use a walker occasionally now.   PAIN:  Are you having pain? Yes: NPRS scale: 8/10 Pain location: R lower leg Pain description: aching Aggravating factors: Fall  Relieving factors: sitting down, keeping legs raised, hot shower  PRECAUTIONS: Fall  WEIGHT BEARING RESTRICTIONS: No  FALLS: Has patient fallen in last 6 months? Yes. Number of falls 1  LIVING ENVIRONMENT: Lives with: lives with their family Lives in: House/apartment Stairs: Yes: Internal: flight steps; on right going up Has following equipment at home: Dan Humphreys - 2 wheeled  PLOF: Independent  PATIENT GOALS: to get stronger  OBJECTIVE:   DIAGNOSTIC FINDINGS:   MRI: IMPRESSION: 1. No evidence of acute abnormality. 2. Prior hemorrhages, prior lacunar infarcts, and prior chronic microvascular ischemic disease which is advanced for  patient age. Findings could be in part secondary to chronic hypertension.  CTA:   1. No emergent large  vessel occlusion. 2. 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 3. Multifocal irregularity and moderate narrowing of the right vertebral artery, mildly progressed.  COGNITION: Overall cognitive status:  short term memory issues s/p stroke.    SENSATION: Light touch: Impaired LLE  COORDINATION: LLE coordination impaired with delay and limited coordination.   EDEMA:  Bilateral lymphedema of LE's   POSTURE: rounded shoulders, forward head, and flexed trunk    LOWER EXTREMITY MMT:    MMT Right Eval Left Eval  Hip flexion 3 3  Hip extension    Hip abduction 4- 3+  Hip adduction 4- 3+  Hip internal rotation    Hip external rotation    Knee flexion 3+ 3  Knee extension 3+ 3  Ankle dorsiflexion 4- 3-  Ankle plantarflexion 4- 3+  Ankle inversion    Ankle eversion    (Blank rows = not tested)   TRANSFERS: Assistive device utilized: None  Sit to stand: CGA Stand to sit: CGA Chair to chair: CGA    GAIT: Gait pattern: step through pattern, decreased stride length, antalgic, wide BOS, poor foot clearance- Right, and poor foot clearance- Left Distance walked: 40 ft Assistive device utilized: None Level of assistance: CGA Comments: Patient is unsteady without AD, has antalgic gait pattern with R knee however has limited strength for weightbearing on LLE.   FUNCTIONAL TESTS:  5 times sit to stand: 41 seconds  10 meter walk test: 21 seconds increased knee pain 6/10 Berg Balance Scale: 14/56  PATIENT SURVEYS:  FOTO 44  TODAY'S TREATMENT:                                                                                                                              DATE: 07/11/22    TherEx:  Nustep seat position 9; LE only - Level 2 x 3 minutes for mm strength and cardiovascular response  Adduction bolster squeeze 15x with 3 sec hold  -Seated  LAQ with 3 second holds, 10x each LE, cueing for muscle activation and sequencing for neutral alignment- difficulty with left LE weakness - able to complete. 3# Aw donned bilaterally   -Standing  hip march 3# on B LE  - seated  heel raise with feet on incline board to improve muscle recruitment for strength gains. 2 x 15 with 3# AW donned   Standing hip abduction x 10 ea side with 3# AW   - Seated hip flex/add/abd up/over 1/2 spike ball x 10 reps each (patient reported as fatiguing) 2.5# on the RLE  Forward step over half foam roller x 10 ea LE with UE assist   Airex balance standing on airex 3 x 30 seconds, good balance response with 1 LOB corrected with UE assist on support bar  Pt required occasional rest breaks due fatigue, but less rest between sets and exercises this date compared to previously     PATIENT EDUCATION: Education details: goals, POC, HEP Person educated: Patient Education  method: Explanation, Demonstration, Tactile cues, and Verbal cues Education comprehension: verbalized understanding, returned demonstration, verbal cues required, and tactile cues required  HOME EXERCISE PROGRAM: Access Code: [email or web address] URL: [email or web address] Date: 05/30/2022 Prepared by: Precious Bard  Exercises - Leg Extension  - 1 x daily - 7 x weekly - 2 sets - 10 reps - 5 hold - Seated March  - 1 x daily - 7 x weekly - 2 sets - 10 reps - 5 hold - Seated Ankle Circles  - 1 x daily - 7 x weekly - 2 sets - 10 reps - 5 hold  GOALS: Goals reviewed with patient? Yes  SHORT TERM GOALS: Target date: 06/27/2022    Patient will be independent in home exercise program to improve strength/mobility for better functional independence with ADLs. Baseline: 2/29: HEP given Goal status: INITIAL   LONG TERM GOALS: Target date: 08/22/2022    Patient will increase FOTO score to equal to or greater than  54%   to demonstrate statistically significant improvement in mobility and  quality of life.  Baseline: 2/29: 44%  Goal status: INITIAL  2.  Patient (< 71 years old) will complete five times sit to stand test in < 10 seconds indicating an increased LE strength and improved balance. Baseline: 2/29: 41 seconds with significant UE support Goal status: INITIAL  3.  Patient will increase Berg Balance score by > 10 points (24/56) to demonstrate decreased fall risk during functional activities. Baseline: 2/29: 14/56 Goal status: INITIAL  4.  Patient will increase 10 meter walk test to >1.75m/s as to improve gait speed for better community ambulation and to reduce fall risk. Baseline: 2/29: 21 seconds increased knee pain Goal status: INITIAL    ASSESSMENT:  CLINICAL IMPRESSION:  Patient presents with good motivation for completion of physical therapy activities.  Patient progresses with lower extremity strength progressing to more standing versus sitting exercises to improve his lower extremity strength and endurance.  Patient still gets fatigued with standing exercises requiring seated rest breaks but is able to complete seated exercises during seated rest breaks in order to improve increase overall exercise load during sessions.  Patient is continuing end of session but is able to ambulate out of clinic utilizing his whorling walker he brought with him this time.  Patient will continue to benefit from skilled physical therapy intervention to address impairments, improve QOL, and attain therapy goals.    OBJECTIVE IMPAIRMENTS: Abnormal gait, cardiopulmonary status limiting activity, decreased activity tolerance, decreased balance, decreased coordination, decreased endurance, decreased mobility, difficulty walking, decreased strength, increased edema, impaired perceived functional ability, impaired flexibility, impaired sensation, impaired UE functional use, impaired vision/preception, improper body mechanics, postural dysfunction, obesity, and pain.   ACTIVITY  LIMITATIONS: carrying, lifting, bending, sitting, standing, squatting, stairs, transfers, bed mobility, bathing, toileting, dressing, reach over head, hygiene/grooming, locomotion level, and caring for others  PARTICIPATION LIMITATIONS: meal prep, cleaning, laundry, personal finances, driving, shopping, community activity, occupation, yard work, school, and church  PERSONAL FACTORS: Age, Behavior pattern, Education, Financial risk analyst, Past/current experiences, Profession, Social background, Time since onset of injury/illness/exacerbation, Transportation, and 3+ comorbidities: CVA (2019, 2023), obesity, Type II DM, HTN, HLD, lymphedema of BLE, Stage IIIa CKD, anemia, depression, hernia,arthritis, CHF  are also affecting patient's functional outcome.   REHAB POTENTIAL: Good  CLINICAL DECISION MAKING: Evolving/moderate complexity  EVALUATION COMPLEXITY: Moderate  PLAN:  PT FREQUENCY: 2x/week  PT DURATION: 12 weeks  PLANNED INTERVENTIONS: Therapeutic exercises, Therapeutic activity, Neuromuscular re-education, Balance training, Gait training, Patient/Family education, Self Care, Joint  mobilization, Joint manipulation, Stair training, Vestibular training, Canalith repositioning, Visual/preceptual remediation/compensation, Orthotic/Fit training, DME instructions, Cognitive remediation, Electrical stimulation, Spinal mobilization, Cryotherapy, Manual lymph drainage, Compression bandaging, Taping, Vasopneumatic device, Traction, Ultrasound, Ionotophoresis 4mg /ml Dexamethasone, Manual therapy, and Re-evaluation  PLAN FOR NEXT SESSION: balance, strength, ambulatory endurance training    Norman Herrlichhristopher B Jaxsyn Catalfamo, PT 07/11/2022, 11:28 AM

## 2022-07-16 ENCOUNTER — Ambulatory Visit: Payer: Medicaid Other

## 2022-07-18 ENCOUNTER — Ambulatory Visit: Payer: Medicaid Other

## 2022-07-18 ENCOUNTER — Ambulatory Visit: Payer: Medicaid Other | Admitting: Occupational Therapy

## 2022-07-22 ENCOUNTER — Ambulatory Visit: Payer: Medicaid Other

## 2022-07-23 NOTE — Progress Notes (Signed)
Established patient visit   Patient: Jeremiah Vance   DOB: 05-12-1970   52 y.o. Male  MRN: [id number] Visit Date: 07/24/2022  Today's healthcare provider: Debera Lat, PA-C   CC: chronic medical conditions fu  Subjective     Patient is doing well.  She has been having physical therapy/Occupational Therapy sessions.  Patient reports losing 26 pounds since his last visit in February.   Diabetes Mellitus Type II, Follow-up  Lab Results  Component Value Date   HGBA1C 6.1 (A) 07/24/2022   HGBA1C 5.5 03/12/2022   HGBA1C 7.0 (H) 01/16/2022   Wt Readings from Last 3 Encounters:  07/24/22 266 lb (120.7 kg)  05/23/22 273 lb (123.8 kg)  05/20/22 292 lb 1.8 oz (132.5 kg)   Last seen for diabetes 3 months ago.  Management since then includes Farxiga 10 Mg daily. He reports good compliance with treatment. He is not having side effects.   Home blood sugar records:  100-130?  Episodes of hypoglycemia? No    Current exercise: PT/OT sessions Current diet habits: in general, a "healthy" diet    Pertinent Labs: Lab Results  Component Value Date   CHOL 106 01/16/2022   HDL 24 (L) 01/16/2022   LDLCALC 61 01/16/2022   TRIG 104 01/16/2022   CHOLHDL 4.4 01/16/2022   Lab Results  Component Value Date   NA 139 05/23/2022   K 4.6 05/23/2022   CREATININE 1.83 (H) 05/23/2022   EGFR 44 (L) 05/23/2022   MICRALBCREAT 42.2 (H) 01/18/2020     Hypertension, follow-up  BP Readings from Last 3 Encounters:  07/24/22 134/82  05/23/22 (!) 83/55  05/20/22 128/72   Wt Readings from Last 3 Encounters:  07/24/22 266 lb (120.7 kg)  05/23/22 273 lb (123.8 kg)  05/20/22 292 lb 1.8 oz (132.5 kg)     He was last seen for hypertension 3 months ago.  BP was 83/55 at that visit, management since last visit includes withhold torsemide 20 Mg daily and continue Coreg 12.5 mg twice daily .  He reports good compliance with treatment. He is not having side effects.  He is following a Diabetic  diet. He is exercising. He does not smoke.   Symptoms: No chest pain No chest pressure  No palpitations No syncope  No dyspnea No orthopnea  No paroxysmal nocturnal dyspnea Yes lower extremity edema   Pertinent labs Lab Results  Component Value Date   CHOL 106 01/16/2022   HDL 24 (L) 01/16/2022   LDLCALC 61 01/16/2022   TRIG 104 01/16/2022   CHOLHDL 4.4 01/16/2022   Lab Results  Component Value Date   NA 139 05/23/2022   K 4.6 05/23/2022   CREATININE 1.83 (H) 05/23/2022   EGFR 44 (L) 05/23/2022   GLUCOSE 134 (H) 05/23/2022   TSH 0.842 11/22/2021     The ASCVD Risk score (Arnett DK, et al., 2019) failed to calculate for the following reasons:   The patient has a prior MI or stroke diagnosis  Pt is currently unemployed. His sister is preparing his disability papers. Pt requests a refill for anxiolytic medication. Pt's insurance was recently approved .   Medications: Outpatient Medications Prior to Visit  Medication Sig   apixaban (ELIQUIS) 5 MG TABS tablet Take 1 tablet (5 mg total) by mouth 2 (two) times daily.   atorvastatin (LIPITOR) 40 MG tablet Take 1 tablet (40 mg total) by mouth daily at 6 PM.   busPIRone (BUSPAR) 7.5 MG tablet Take 1  tablet (7.5 mg total) by mouth 2 (two) times daily.   carvedilol (COREG) 12.5 MG tablet Take 1 tablet (12.5 mg total) by mouth 2 (two) times daily with a meal.   Continuous Blood Gluc Sensor (FREESTYLE LIBRE 2 SENSOR) MISC Place 1 sensor on the skin every 14 days. Use to check glucose continuously   Continuous Blood Gluc Sensor (FREESTYLE LIBRE 3 SENSOR) MISC Place 1 sensor on the skin every 14 days. Use to check glucose continuously   dapagliflozin propanediol (FARXIGA) 10 MG TABS tablet Take 1 tablet (10 mg) by mouth once daily   glimepiride (AMARYL) 4 MG tablet Take 1 tablet (4 mg total) by mouth daily with breakfast.   hydrALAZINE (APRESOLINE) 50 MG tablet Take 1 tablet (50 mg total) by mouth in the morning and at bedtime.    irbesartan (AVAPRO) 300 MG tablet Take 300 mg by mouth daily.   LANTUS 100 UNIT/ML injection SMARTSIG:15 Unit(s) SUB-Q Daily   linagliptin (TRADJENTA) 5 MG TABS tablet Take 1 tablet (5 mg total) by mouth daily.   torsemide (DEMADEX) 20 MG tablet Take 2 tablets (40 mg total) by mouth daily. (Patient not taking: Reported on 05/30/2022)   No facility-administered medications prior to visit.    Review of Systems  All other systems reviewed and are negative. Except see hpi     Objective    There were no vitals taken for this visit.   Physical Exam Constitutional:      General: He is not in acute distress.    Appearance: Normal appearance. He is obese. He is not diaphoretic.  HENT:     Head: Normocephalic.     Nose: Nose normal.  Eyes:     Conjunctiva/sclera: Conjunctivae normal.     Pupils: Pupils are equal, round, and reactive to light.  Pulmonary:     Effort: Pulmonary effort is normal. No respiratory distress.  Musculoskeletal:        General: Normal range of motion.     Cervical back: Normal range of motion.  Neurological:     Mental Status: He is alert and oriented to person, place, and time. Mental status is at baseline.  Psychiatric:        Behavior: Behavior normal.        Thought Content: Thought content normal.        Judgment: Judgment normal.     No results found for any visits on 07/24/22.  Assessment & Plan    Morbid obesity (HCC) Chronic and improving Improved from 41.91 to 38.17.  Congratulated patient with loss of more than 20 pounds! Continue physical exercise/PT/OT Continue healthy diet  Type 2 diabetes mellitus with hyperglycemia, without long-term current use of insulin (HCC) Chronic and stable POCT A1c was 6.1 today - POCT HgB A1C Continue Farxiga 10 Mg daily Continue low-carb diet Continue OT/PT  Essential hypertension Chronic and stable Continue Coreg 12.5 Mg twice daily Continue low-salt diet and regular exercise  Left-sided  weakness Chronic and improving Continue OT/PT/case  Stage 3 chronic kidney disease, unspecified whether stage 3a or 3b CKD (HCC) Chronic and stable Last GFR from 05/23/2022 was 44, creatinine was 1.83 He needs to drink 8-6 glasses of water every day while watching for leg swelling, avoid NSAIDs, and have renal functions check avery 6-12 months to ensure stability.     Sleep apnea, unspecified type Chronic and unstable Patient planning to proceed with home sleep studies as he has a medical insurance now  Chronic heart failure with preserved  ejection fraction (HCC) Lymphedema of both lower extremities Chronic and stable His statis dermatitis and leg swelling improving Continue taking Farxiga 10 Mg daily Will follow-up  Anxiety and depression Grief Chronic and currently stable on BuSpar - busPIRone (BUSPAR) 7.5 MG tablet; Take 1 tablet (7.5 mg total) by mouth 2 (two) times daily.  Dispense: 60 tablet; Refill: 3 Advised relaxation techniques Will reassess  FU in 3 mo for chronic conditions. Will update his labs. The patient was advised to call back or seek an in-person evaluation if the symptoms worsen or if the condition fails to improve as anticipated.  I discussed the assessment and treatment plan with the patient. The patient was provided an opportunity to ask questions and all were answered. The patient agreed with the plan and demonstrated an understanding of the instructions.  The entirety of the information documented in the History of Present Illness, Review of Systems and Physical Exam were personally obtained by me. Portions of this information were initially documented by the CMA and reviewed by me for thoroughness and accuracy.   I spent 20 minutes dedicated to the care of this patient on the date of this encounter to include pre-visit review of records, face-to-face with the patient discussing his condition including DMII, weight loss, chronic HF, post strokes and  po/treatment , and post visit completion of appropriate documentation.Debera Lat, [facility], MMS [facility] [phone] (phone) [phone] (fax)

## 2022-07-24 ENCOUNTER — Encounter: Payer: Self-pay | Admitting: Physical Therapy

## 2022-07-24 ENCOUNTER — Ambulatory Visit: Payer: Medicaid Other | Admitting: Physical Therapy

## 2022-07-24 ENCOUNTER — Encounter: Payer: Self-pay | Admitting: Physician Assistant

## 2022-07-24 ENCOUNTER — Ambulatory Visit: Payer: Medicaid Other | Admitting: Physician Assistant

## 2022-07-24 DIAGNOSIS — R2681 Unsteadiness on feet: Secondary | ICD-10-CM | POA: Diagnosis not present

## 2022-07-24 DIAGNOSIS — R531 Weakness: Secondary | ICD-10-CM

## 2022-07-24 DIAGNOSIS — G4730 Sleep apnea, unspecified: Secondary | ICD-10-CM

## 2022-07-24 DIAGNOSIS — R262 Difficulty in walking, not elsewhere classified: Secondary | ICD-10-CM

## 2022-07-24 DIAGNOSIS — F4321 Adjustment disorder with depressed mood: Secondary | ICD-10-CM

## 2022-07-24 DIAGNOSIS — F32A Depression, unspecified: Secondary | ICD-10-CM

## 2022-07-24 DIAGNOSIS — E1165 Type 2 diabetes mellitus with hyperglycemia: Secondary | ICD-10-CM | POA: Diagnosis not present

## 2022-07-24 DIAGNOSIS — M6281 Muscle weakness (generalized): Secondary | ICD-10-CM | POA: Diagnosis not present

## 2022-07-24 DIAGNOSIS — N183 Chronic kidney disease, stage 3 unspecified: Secondary | ICD-10-CM

## 2022-07-24 DIAGNOSIS — F419 Anxiety disorder, unspecified: Secondary | ICD-10-CM

## 2022-07-24 DIAGNOSIS — I5032 Chronic diastolic (congestive) heart failure: Secondary | ICD-10-CM | POA: Diagnosis not present

## 2022-07-24 DIAGNOSIS — I10 Essential (primary) hypertension: Secondary | ICD-10-CM

## 2022-07-24 DIAGNOSIS — R278 Other lack of coordination: Secondary | ICD-10-CM | POA: Diagnosis not present

## 2022-07-24 LAB — POCT GLYCOSYLATED HEMOGLOBIN (HGB A1C): Hemoglobin A1C: 6.1 % — AB (ref 4.0–5.6)

## 2022-07-24 MED ORDER — BUSPIRONE HCL 7.5 MG PO TABS
7.5000 mg | ORAL_TABLET | Freq: Two times a day (BID) | ORAL | 3 refills | Status: DC
Start: 2022-07-24 — End: 2022-09-06

## 2022-07-24 NOTE — Therapy (Signed)
OUTPATIENT PHYSICAL THERAPY NEURO TREATMENT/ Physical Therapy Progress Note   Dates of reporting period  05/30/22   to   07/24/22    Patient Name: Jeremiah Vance MRN: [id number] DOB:03-04-1971, 52 y.o., male Today's Date: 07/24/2022   PCP: Debera Lat PA REFERRING PROVIDER: Debera Lat PA  END OF SESSION:  PT End of Session - 07/24/22 1125     Visit Number 10    Number of Visits 24    Date for PT Re-Evaluation 08/22/22    Progress Note Due on Visit 20    PT Start Time 1103    PT Stop Time 1144    PT Time Calculation (min) 41 min    Equipment Utilized During Treatment Gait belt    Activity Tolerance Patient tolerated treatment well;Patient limited by pain    Behavior During Therapy [facility] for tasks assessed/performed                   Past Medical History:  Diagnosis Date   Acute ischemic stroke 09/06/2017   Acute renal failure superimposed on stage 3a chronic kidney disease 07/08/2019   Acute right-sided weakness    Allergies    Anasarca    Anemia 07/10/2017   Arthritis    CHF (congestive heart failure)    "coinsided w/kidney problems I was having 06/2017"   Chicken pox    CKD (chronic kidney disease) stage 3, GFR 30-59 ml/min 06/2017   Depression    Elevated troponin 07/08/2019   High cholesterol    History of cardiomyopathy    LVEF 40 to 45% in April 2019 - subsequently normalized   Hyperbilirubinemia 07/10/2017   Hypertension    Ischemic stroke    Small left internal capsule infarct due to lacunar disease   Morbid obesity    Normocytic anemia 12/16/2017   Recurrent incisional hernia with incarceration s/p repair 10/22/2017 10/21/2017   Stroke 08/2017   "right sided weakness since; getting stronger though" (10/22/2017)   Type 2 diabetes mellitus    Past Surgical History:  Procedure Laterality Date   ABDOMINAL HERNIA REPAIR  2008; 10/22/2017   "scope; OPEN REPAIR INCARCERATED VENTRAL HERNIA   HERNIA REPAIR     KNEE ARTHROSCOPY Right 1989   VENTRAL  HERNIA REPAIR N/A 10/22/2017   Procedure: OPEN REPAIR INCARCERATED VENTRAL HERNIA;  Surgeon: Glenna Fellows, MD;  Location: [facility] OR;  Service: General;  Laterality: N/A;   Patient Active Problem List   Diagnosis Date Noted   Dysarthria 05/20/2022   Need for diphtheria-tetanus-pertussis (Tdap) vaccine 03/20/2022   Type 2 diabetes mellitus without complication, with long-term current use of insulin 01/16/2022   Dyslipidemia 01/16/2022   Chronic diastolic CHF (congestive heart failure) 01/16/2022   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    Intracranial hemorrhage    HFrEF (heart failure with reduced ejection fraction)    Obesity, Class III, BMI 40-49.9 (morbid obesity) 05/22/2020   Other cirrhosis of liver    Ventral hernia without obstruction or gangrene    Shortness of breath    Neck pain    Prostate cancer screening 01/21/2020   Lymphedema of both lower extremities 10/17/2019   History of stroke 10/17/2019   CKD (chronic kidney disease) stage 3, GFR 30-59 ml/min 08/31/2019   Swelling of limb 08/31/2019   DM (diabetes mellitus), type 2 with complications 07/08/2019   Vitreous floaters of both eyes 06/03/2018   Moderate obstructive sleep apnea 02/11/2018   Chronic fatigue 12/16/2017   Essential hypertension 12/16/2017  History of small bowel obstruction 10/24/2017   Adjustment disorder with depressed mood 09/30/2017   Stroke 09/14/2017   Hyperlipidemia    Gout    Elevated LFTs    Heart failure with preserved ejection fraction    Morbid obesity    Hypoalbuminemia 07/11/2017   Type 2 diabetes mellitus with hyperglycemia 07/10/2017    ONSET DATE: 05/20/22  REFERRING DIAG: Left sided weakness   THERAPY DIAG:  Muscle weakness (generalized)  Unsteadiness on feet  Difficulty in walking, not elsewhere classified  Rationale for Evaluation and Treatment: Rehabilitation  SUBJECTIVE:                                                                                                                                                                                              SUBJECTIVE STATEMENT: Patient reports he has been doing well with no significant changes since previous session.   Pt accompanied by: self  PERTINENT HISTORY:   Patient presents for Left sided weakness. Patient presented to [facility] on 05/20/22 for L sided weakness and slurred speech.MRI negative.  PMH includes CVA (2019, 2023), obesity, Type II DM, HTN, HLD, lymphedema of BLE, Stage IIIa CKD, anemia, depression, hernia,arthritis, CHF. Patient had multiple strokes in  fall 2023 and received home health. 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 Patient has not worked the past two weeks due to another incident 2-3 weeks ago. Prior to stroke was independent, use a walker occasionally now.   PAIN:  Are you having pain? Yes: NPRS scale: 8/10 Pain location: R lower leg Pain description: aching Aggravating factors: Fall  Relieving factors: sitting down, keeping legs raised, hot shower  PRECAUTIONS: Fall  WEIGHT BEARING RESTRICTIONS: No  FALLS: Has patient fallen in last 6 months? Yes. Number of falls 1  LIVING ENVIRONMENT: Lives with: lives with their family Lives in: House/apartment Stairs: Yes: Internal: flight steps; on right going up Has following equipment at home: Dan Humphreys - 2 wheeled  PLOF: Independent  PATIENT GOALS: to get stronger  OBJECTIVE:   DIAGNOSTIC FINDINGS:   MRI: IMPRESSION: 1. No evidence of acute abnormality. 2. Prior hemorrhages, prior lacunar infarcts, and prior chronic microvascular ischemic disease which is advanced for patient age. Findings could be in part secondary to chronic hypertension.  CTA:   1. No emergent large vessel occlusion. 2. 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 3. Multifocal irregularity and moderate narrowing of the right vertebral artery, mildly progressed.  COGNITION: Overall cognitive  status:  short term memory issues s/p stroke.    SENSATION: Light touch: Impaired LLE  COORDINATION: LLE coordination impaired with delay and limited  coordination.   EDEMA:  Bilateral lymphedema of LE's   POSTURE: rounded shoulders, forward head, and flexed trunk    LOWER EXTREMITY MMT:    MMT Right Eval Left Eval  Hip flexion 3 3  Hip extension    Hip abduction 4- 3+  Hip adduction 4- 3+  Hip internal rotation    Hip external rotation    Knee flexion 3+ 3  Knee extension 3+ 3  Ankle dorsiflexion 4- 3-  Ankle plantarflexion 4- 3+  Ankle inversion    Ankle eversion    (Blank rows = not tested)   TRANSFERS: Assistive device utilized: None  Sit to stand: CGA Stand to sit: CGA Chair to chair: CGA    GAIT: Gait pattern: step through pattern, decreased stride length, antalgic, wide BOS, poor foot clearance- Right, and poor foot clearance- Left Distance walked: 40 ft Assistive device utilized: None Level of assistance: CGA Comments: Patient is unsteady without AD, has antalgic gait pattern with R knee however has limited strength for weightbearing on LLE.   FUNCTIONAL TESTS:  5 times sit to stand: 41 seconds  10 meter walk test: 21 seconds increased knee pain 6/10 Berg Balance Scale: 14/56  PATIENT SURVEYS:  FOTO 44  TODAY'S TREATMENT:                                                                                                                              DATE: 07/24/22  Physical therapy treatment session today consisted of completing assessment of goals and administration of testing as demonstrated and documented in flow sheet, treatment, and goals section of this note. Addition treatments may be found below.    [facility] PT Assessment - 07/24/22 0001       Berg Balance Test   Sit to Stand Able to stand  independently using hands    Standing Unsupported Able to stand safely 2 minutes    Sitting with Back Unsupported but Feet Supported on Floor or Stool  Able to sit safely and securely 2 minutes    Stand to Sit Controls descent by using hands    Transfers Able to transfer safely, definite need of hands    Standing Unsupported with Eyes Closed Able to stand 10 seconds with supervision    Standing Unsupported with Feet Together Able to place feet together independently and stand for 1 minute with supervision    From Standing, Reach Forward with Outstretched Arm Can reach forward >5 cm safely (2")    From Standing Position, Pick up Object from Floor Able to pick up shoe, needs supervision    From Standing Position, Turn to Look Behind Over each Shoulder Turn sideways only but maintains balance    Turn 360 Degrees Able to turn 360 degrees safely but slowly    Standing Unsupported, Alternately Place Feet on Step/Stool Able to complete >2 steps/needs minimal assist    Standing Unsupported, One Foot in Front Needs help to step but can  hold 15 seconds    Standing on One Leg Tries to lift leg/unable to hold 3 seconds but remains standing independently    Total Score 35              TherEx:  Physical therapy treatment session today consisted of completing assessment of goals and administration of testing as demonstrated and documented in flow sheet, treatment, and goals section of this note. Addition treatments may be found below.   - seated  heel raise with feet on 1/2 foam to improve muscle recruitment for strength gains. 2 x 15 with 3# AW donned    Pt required occasional rest breaks due fatigue, but less rest between sets and exercises this date compared to previously     PATIENT EDUCATION: Education details: goals, POC, HEP Person educated: Patient Education method: Explanation, Demonstration, Tactile cues, and Verbal cues Education comprehension: verbalized understanding, returned demonstration, verbal cues required, and tactile cues required  HOME EXERCISE PROGRAM: Access Code: [email or web address] URL:  [email or web address] Date: 05/30/2022 Prepared by: Precious Bard  Exercises - Leg Extension  - 1 x daily - 7 x weekly - 2 sets - 10 reps - 5 hold - Seated March  - 1 x daily - 7 x weekly - 2 sets - 10 reps - 5 hold - Seated Ankle Circles  - 1 x daily - 7 x weekly - 2 sets - 10 reps - 5 hold  GOALS: Goals reviewed with patient? Yes  SHORT TERM GOALS: Target date: 06/27/2022    Patient will be independent in home exercise program to improve strength/mobility for better functional independence with ADLs. Baseline: 2/29: HEP given 4/24: completing 3-4 times per week with no issues  Goal status: MET   LONG TERM GOALS: Target date: 08/22/2022    Patient will increase FOTO score to equal to or greater than  54%   to demonstrate statistically significant improvement in mobility and quality of life.  Baseline: 2/29: 44% 4/24:55% Goal status: MET  2.  Patient (< 18 years old) will complete five times sit to stand test in < 10 seconds indicating an increased LE strength and improved balance. Baseline: 2/29: 41 seconds with significant UE support 07/24/22:19.38 R UE support only  Goal status: IN PROGRESS  3.  Patient will increase Berg Balance score by > 8 points (44/56) to demonstrate decreased fall risk during functional activities. Baseline: 2/29: 14/56 4/24: 36/56 Goal status: Initially met, altered to continue to monitor and decrease fall risk   4.  Patient will increase 10 meter walk test to >1.42m/s as to improve gait speed for better community ambulation and to reduce fall risk. Baseline: 2/29: 21 seconds increased knee pain 4/24:20 sec  Goal status: NOT MET    ASSESSMENT:  CLINICAL IMPRESSION:  Patient presents to physical therapy for progress note this date.  Patient shows good progress toward all of his goals meeting his Sharlene Motts balance goal at this time.  Patient also makes good progress or his 5 times at this time goal using less upper extremity support and also  decreasing his time indicating improved lower extremity power as well as concurrent decrease in risk of falls.  Patient still working on his 21 m walk test at this time was very similar to his previously tested 10 m walk time.  There is a caveat that this was done at end of session and patient was experiencing some lower extremity fatigue from all other activities performed.  Patient informed he should still use an  assistive device when ambulating on unfamiliar ground based on his balance testing this date.  Patient continuing to make good progress and will continue to benefit from skilled physical therapy.Patient's condition has the potential to improve in response to therapy. Maximum improvement is yet to be obtained. The anticipated improvement is attainable and reasonable in a generally predictable time.  Pt will continue to benefit from skilled physical therapy intervention to address impairments, improve QOL, and attain therapy goals.     OBJECTIVE IMPAIRMENTS: Abnormal gait, cardiopulmonary status limiting activity, decreased activity tolerance, decreased balance, decreased coordination, decreased endurance, decreased mobility, difficulty walking, decreased strength, increased edema, impaired perceived functional ability, impaired flexibility, impaired sensation, impaired UE functional use, impaired vision/preception, improper body mechanics, postural dysfunction, obesity, and pain.   ACTIVITY LIMITATIONS: carrying, lifting, bending, sitting, standing, squatting, stairs, transfers, bed mobility, bathing, toileting, dressing, reach over head, hygiene/grooming, locomotion level, and caring for others  PARTICIPATION LIMITATIONS: meal prep, cleaning, laundry, personal finances, driving, shopping, community activity, occupation, yard work, school, and church  PERSONAL FACTORS: Age, Behavior pattern, Education, Financial risk analyst, Past/current experiences, Profession, Social background, Time since onset of  injury/illness/exacerbation, Transportation, and 3+ comorbidities: CVA (2019, 2023), obesity, Type II DM, HTN, HLD, lymphedema of BLE, Stage IIIa CKD, anemia, depression, hernia,arthritis, CHF  are also affecting patient's functional outcome.   REHAB POTENTIAL: Good  CLINICAL DECISION MAKING: Evolving/moderate complexity  EVALUATION COMPLEXITY: Moderate  PLAN:  PT FREQUENCY: 2x/week  PT DURATION: 12 weeks  PLANNED INTERVENTIONS: Therapeutic exercises, Therapeutic activity, Neuromuscular re-education, Balance training, Gait training, Patient/Family education, Self Care, Joint mobilization, Joint manipulation, Stair training, Vestibular training, Canalith repositioning, Visual/preceptual remediation/compensation, Orthotic/Fit training, DME instructions, Cognitive remediation, Electrical stimulation, Spinal mobilization, Cryotherapy, Manual lymph drainage, Compression bandaging, Taping, Vasopneumatic device, Traction, Ultrasound, Ionotophoresis 4mg /ml Dexamethasone, Manual therapy, and Re-evaluation  PLAN FOR NEXT SESSION: balance, strength, ambulatory endurance training    Norman Herrlich, PT 07/24/2022, 2:09 PM

## 2022-07-26 DIAGNOSIS — F32A Depression, unspecified: Secondary | ICD-10-CM | POA: Insufficient documentation

## 2022-07-26 DIAGNOSIS — F419 Anxiety disorder, unspecified: Secondary | ICD-10-CM | POA: Insufficient documentation

## 2022-07-29 ENCOUNTER — Ambulatory Visit: Payer: Medicaid Other

## 2022-07-29 ENCOUNTER — Ambulatory Visit: Payer: Medicaid Other | Admitting: Occupational Therapy

## 2022-07-29 DIAGNOSIS — R278 Other lack of coordination: Secondary | ICD-10-CM

## 2022-07-29 DIAGNOSIS — M6281 Muscle weakness (generalized): Secondary | ICD-10-CM

## 2022-07-29 DIAGNOSIS — R262 Difficulty in walking, not elsewhere classified: Secondary | ICD-10-CM | POA: Diagnosis not present

## 2022-07-29 DIAGNOSIS — R2681 Unsteadiness on feet: Secondary | ICD-10-CM | POA: Diagnosis not present

## 2022-07-29 NOTE — Therapy (Signed)
Occupational Therapy Progress Note  Dates of reporting period  05/30/2022   to   07/29/2022   Patient Name: RAUNAK ANTUNA MRN: [id number] DOB:[birth year], 52 y.o., male Today's Date: 07/29/2022  PCP: Debera Lat, PA-C REFERRING PROVIDER: Debera Lat, PA-C   OT End of Session - 07/29/22 0936     Visit Number 10    Number of Visits 24    Date for OT Re-Evaluation 10/21/2022   Progress Note Due on Visit 10    OT Start Time 0930    OT Stop Time 1015    OT Time Calculation (min) 45 min    Activity Tolerance Patient tolerated treatment well    Behavior During Therapy [facility] for tasks assessed/performed               Past Medical History:  Diagnosis Date   Acute ischemic stroke (HCC) 09/06/2017   Acute renal failure superimposed on stage 3a chronic kidney disease (HCC) 07/08/2019   Acute right-sided weakness    Allergies    Anasarca    Anemia 07/10/2017   Arthritis    CHF (congestive heart failure) (HCC)    "coinsided w/kidney problems I was having 06/2017"   Chicken pox    CKD (chronic kidney disease) stage 3, GFR 30-59 ml/min (HCC) 06/2017   Depression    Elevated troponin 07/08/2019   High cholesterol    History of cardiomyopathy    LVEF 40 to 45% in April 2019 - subsequently normalized   Hyperbilirubinemia 07/10/2017   Hypertension    Ischemic stroke (HCC)    Small left internal capsule infarct due to lacunar disease   Morbid obesity (HCC)    Normocytic anemia 12/16/2017   Recurrent incisional hernia with incarceration s/p repair 10/22/2017 10/21/2017   Stroke (HCC) 08/2017   "right sided weakness since; getting stronger though" (10/22/2017)   Type 2 diabetes mellitus (HCC)    Past Surgical History:  Procedure Laterality Date   ABDOMINAL HERNIA REPAIR  2008; 10/22/2017   "scope; OPEN REPAIR INCARCERATED VENTRAL HERNIA   HERNIA REPAIR     KNEE ARTHROSCOPY Right 1989   VENTRAL HERNIA REPAIR N/A 10/22/2017   Procedure: OPEN REPAIR INCARCERATED VENTRAL HERNIA;  Surgeon:  Glenna Fellows, MD;  Location: [facility] OR;  Service: General;  Laterality: N/A;   Patient Active Problem List   Diagnosis Date Noted   Anxiety and depression 07/26/2022   Dysarthria 05/20/2022   Need for diphtheria-tetanus-pertussis (Tdap) vaccine 03/20/2022   Type 2 diabetes mellitus without complication, with long-term current use of insulin (HCC) 01/16/2022   Dyslipidemia 01/16/2022   Chronic diastolic CHF (congestive heart failure) (HCC) 01/16/2022   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    Intracranial hemorrhage (HCC)    HFrEF (heart failure with reduced ejection fraction) (HCC)    Obesity, Class III, BMI 40-49.9 (morbid obesity) (HCC) 05/22/2020   Other cirrhosis of liver (HCC)    Ventral hernia without obstruction or gangrene    Shortness of breath    Neck pain    Prostate cancer screening 01/21/2020   Lymphedema of both lower extremities 10/17/2019   History of stroke 10/17/2019   CKD (chronic kidney disease) stage 3, GFR 30-59 ml/min (HCC) 08/31/2019   Swelling of limb 08/31/2019   DM (diabetes mellitus), type 2 with complications (HCC) 07/08/2019   Vitreous floaters of both eyes 06/03/2018   Moderate obstructive sleep apnea 02/11/2018   Chronic fatigue 12/16/2017   Essential hypertension 12/16/2017   History of small bowel  obstruction 10/24/2017   Adjustment disorder with depressed mood 09/30/2017   Stroke (HCC) 09/14/2017   Hyperlipidemia    Gout    Elevated LFTs    Heart failure with preserved ejection fraction (HCC)    Morbid obesity (HCC)    Hypoalbuminemia 07/11/2017   Type 2 diabetes mellitus with hyperglycemia (HCC) 07/10/2017   ONSET DATE: Aug 2023  REFERRING DIAG: L sided weakness  THERAPY DIAG:  Muscle weakness (generalized)  Other lack of coordination  Rationale for Evaluation and Treatment: Rehabilitation  SUBJECTIVE: Pt. Reports soreness from the weather today. SUBJECTIVE STATEMENT: Pt reports no more  soreness from a recent fall.  PERTINENT HISTORY: Pt reports 2-3 CVAs in Aug of 2023, but a total of 5 CVAs over the course of 4 years.  After the strokes in Aug, pt participated in [facility] therapy for 2-3 months.  Pt reports that he had been able to return to work up until 05/20/22.  Pt reported that his work felt like he was exhibiting stroke symptoms, so he went to the ED.  Per chart from ED visit on 05/20/22:     Clinical Course as of 05/20/22 1559  Mon May 20, 2022  1505 Prior CVA with soft blood pressures - reactivation of old CVA.  CTA negative. Code stroke and if MRI negative then will dc home.  [SM]     Clinical Course User Index [SM] Corena Herter, MD  MRI negative.  Patient states that he is feeling better.  Blood pressure improved while in the emergency department.  Last blood pressure 130 systolic.  Patient discharged home in stable condition with discussion to follow-up as an outpatient and return precautions given.   Corena Herter, MD 05/20/22 1733     PRECAUTIONS: fall  WEIGHT BEARING RESTRICTIONS: No  PAIN:  Are you having pain? Yes: NPRS scale: 6/10 Pain location: All over pain Pain description: achy Aggravating factors: cold weather, walking, stairs Relieving factors: rest, elevation  FALLS: Has patient fallen in last 6 months? Yes. 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  PATIENT GOALS: To get the L side stronger and return to work.   OBJECTIVE:   HAND DOMINANCE: Right  ADLs: Overall ADLs: modified indep with  primarily use of the R arm. Transfers/ambulation related to ADLs: uses the walker outside, indoors no walker Eating: sister cuts food  Grooming: indep UB Dressing: extra time to manage clothing fasteners LB Dressing: slip on shoes; tucks laces into shoes Toileting: indep Bathing: modified indep Tub Shower transfers: walk in shower Equipment: see above  IADLs: Shopping: pt can accompany sister and pt pushes the cart Light housekeeping: shared task between pt and sister  Meal Prep: some difficulty opening new packages/containers but can manage simple light meal prep/microwavable meals  Community mobility: able to drive, limited ambulator d/t obesity, arthritic knees, and L sided weakness Medication management: sister sets up pills into weekly pill organizer  Financial management: pt manages  Handwriting: No issues (pt is R hand dominant)  POSTURE COMMENTS:  rounded shoulders  ACTIVITY  TOLERANCE: Activity tolerance: fair; limited community ambulator  FUNCTIONAL OUTCOME MEASURES: FOTO: 52; predicted 55  UPPER EXTREMITY ROM:    Active ROM Right Eval WNL Left eval Left 07/29/2022  Shoulder flexion  98 (limited by pain) 121  Shoulder abduction  102 110  Shoulder adduction     Shoulder extension     Shoulder internal rotation     Shoulder external rotation     Elbow flexion     Elbow extension     Wrist flexion     Wrist extension     Wrist ulnar deviation     Wrist radial deviation     Wrist pronation     Wrist supination     (Blank rows = not tested)  UPPER EXTREMITY MMT:     MMT Right eval Left eval Left  07/29/2022  Shoulder flexion 4+ NT (pain) 4-/5  Shoulder abduction 4 NT (pain) 3/5  Shoulder adduction     Shoulder extension     Shoulder internal rotation     Shoulder external rotation     Middle trapezius     Lower trapezius     Elbow flexion 5 4- 4/5  Elbow extension 5 4- 4/5  Wrist flexion 5 3+ 4-/5  Wrist extension 5 3+ 4-/5  Wrist ulnar  deviation     Wrist radial deviation     Wrist pronation     Wrist supination     (Blank rows = not tested)  HAND FUNCTION: Grip strength: Right: 71 lbs; Left: 27 lbs, Lateral pinch: Right: 16 lbs, Left: 6 lbs, and 3 point pinch: Right: 18 lbs, Left: 8 lbs  07/29/2022:  Grip strength: Right: 71 lbs; Left: 40 lbs, Lateral pinch: Right: 16 lbs, Left: 7 lbs, and 3 point pinch: Right: 18 lbs, Left: 6 lbs  COORDINATION: Finger Nose Finger test: L sided good accuracy but slower  9 Hole Peg test: Right: 38 sec; Left: 46 sec Moderate difficulty opposing L hand digits to thumb   07/29/2022:  9-Hole Peg test: Right: 51 sec; Left: 51 sec. Pt. scores affecting by limited vision. 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 Pt. has made progress with left shoulder ROM, and LUE strength, left grip strength, and left pinch strength skills. Pt. is now able to hold items more securely with the left hand, and is able to grasp socks  easier. Pt.'s FOTO remains a 52.  Pt. continues to work towards goals in plan of care into the next recertification period to maximize safety and independence in necessary daily tasks.     PATIENT EDUCATION: Education details: LUE strengthening Person educated: Patient Education method: Explanation and Verbal cues Education comprehension: verbalized understanding  HOME EXERCISE PROGRAM: Theraputty, theraband  GOALS: Goals reviewed with patient? Yes  SHORT TERM GOALS: Target date:09/09/2022   Pt will be indep to perform HEP for increasing strength and coordination throughout the LUE. Baseline: 07/29/2022: Independent however required occasional visual cuing.  Eval: issued putty exercises Goal status: Partially met   LONG TERM GOALS: Target date: 10/21/2022   Pt will increase FOTO score to 55 or better to indicate clinically relevant improvement in self perceived use of L arm to perform daily tasks.  Baseline: 07/29/2022: 52 Eval: 52 Goal status: Ongoing   2.  Pt will increase L grip strength by 10 or more lbs in order to hold and carry ADL supplies in L hand. Baseline: 07/29/2022: Left grip: 40 lbs. Eval: L grip 27 lbs (R 71 lbs) Goal status: Ongoing  3.  Pt will increase LUE strength at the elbow and wrist in order to carry a heavy laundry basket with BUEs. Baseline: 07/29/2022:  L elbow flex 4, wrist flex 4- Eval: L elbow flex 4-, wrist flex 3+ Goal status: Ongoing  4.  Pt will increase L lateral pinch strength in order to ease ability to open small bottles and food packages.  Baseline: 07/29/2022:  L 7 lbs Eval: L 6 lbs (R 16 lbs) Goal status:Ongoing  5.  Pt will increase L FMC/dexterity skills to ease ability to manipulate small ADL supplies.  Baseline:07/29/2022: L 9 hole: 51 sec (R 51 sec) Pt. Is able to grasp larger pills easier. Eval: L 9 hole 48 sec (R 36 sec); extra time to manage clothing fasteners and to pick up pills Goal status: Ongoing   ASSESSMENT: CLINICAL  IMPRESSION:  Measurements were obtained, and goals were reviewed with the Pt. Pt. has made progress with left shoulder ROM, and LUE strength, left grip strength, and left pinch strength skills. Pt. is now able to hold items more securely with the left hand, and is able to grasp socks easier. Pt.'s FOTO remains a 52.  Pt. continues to work towards goals in plan of care into the next recertification period to maximize safety and independence in necessary daily tasks.    PERFORMANCE DEFICITS: in functional skills including ADLs, IADLs, coordination, dexterity, sensation, strength, pain, Fine motor control, Gross motor control, mobility, balance, body mechanics, endurance, decreased knowledge of use of DME, vision, and UE functional use, and psychosocial skills including coping strategies, environmental adaptation, habits, and routines and behaviors.   IMPAIRMENTS: are limiting patient from ADLs and IADLs.   CO-MORBIDITIES: has co-morbidities such as legally blind L eye, obesity, arthritis  that affects occupational performance. Patient will benefit from skilled OT to address above impairments and improve overall function.  MODIFICATION OR ASSISTANCE TO COMPLETE EVALUATION: No modification of tasks or assist necessary to complete an evaluation.  OT OCCUPATIONAL PROFILE AND HISTORY: Problem focused assessment: Including review of records relating to presenting problem.  CLINICAL DECISION  MAKING: Moderate - several treatment options, min-mod task modification necessary  REHAB POTENTIAL: Good  EVALUATION COMPLEXITY: Moderate  PLAN:  OT FREQUENCY: 2x/week  OT DURATION: 12 weeks  PLANNED INTERVENTIONS: self care/ADL training, therapeutic exercise, therapeutic activity, neuromuscular re-education, manual therapy, passive range of motion, balance training, functional mobility training, moist heat, cryotherapy, contrast bath, patient/family education, energy conservation, coping strategies training,  and DME and/or AE instructions  RECOMMENDED OTHER SERVICES: None at this time  CONSULTED AND AGREED WITH PLAN OF CARE: Patient  PLAN FOR NEXT SESSION: see above  Olegario Messier, MS, OTR/L

## 2022-07-29 NOTE — Therapy (Signed)
OUTPATIENT PHYSICAL THERAPY NEURO TREATMENT   Patient Name: Jeremiah Vance MRN: [id number] DOB:1971-03-09, 52 y.o., male Today's Date: 07/29/2022   PCP: Debera Lat PA REFERRING PROVIDER: Debera Lat PA  END OF SESSION:  PT End of Session - 07/29/22 0842     Visit Number 11    Number of Visits 24    Date for PT Re-Evaluation 08/22/22    Progress Note Due on Visit 20    PT Start Time 0845    Equipment Utilized During Treatment Gait belt    Activity Tolerance Patient tolerated treatment well;Patient limited by pain    Behavior During Therapy [facility] for tasks assessed/performed                    Past Medical History:  Diagnosis Date   Acute ischemic stroke (HCC) 09/06/2017   Acute renal failure superimposed on stage 3a chronic kidney disease (HCC) 07/08/2019   Acute right-sided weakness    Allergies    Anasarca    Anemia 07/10/2017   Arthritis    CHF (congestive heart failure) (HCC)    "coinsided w/kidney problems I was having 06/2017"   Chicken pox    CKD (chronic kidney disease) stage 3, GFR 30-59 ml/min (HCC) 06/2017   Depression    Elevated troponin 07/08/2019   High cholesterol    History of cardiomyopathy    LVEF 40 to 45% in April 2019 - subsequently normalized   Hyperbilirubinemia 07/10/2017   Hypertension    Ischemic stroke (HCC)    Small left internal capsule infarct due to lacunar disease   Morbid obesity (HCC)    Normocytic anemia 12/16/2017   Recurrent incisional hernia with incarceration s/p repair 10/22/2017 10/21/2017   Stroke (HCC) 08/2017   "right sided weakness since; getting stronger though" (10/22/2017)   Type 2 diabetes mellitus (HCC)    Past Surgical History:  Procedure Laterality Date   ABDOMINAL HERNIA REPAIR  2008; 10/22/2017   "scope; OPEN REPAIR INCARCERATED VENTRAL HERNIA   HERNIA REPAIR     KNEE ARTHROSCOPY Right 1989   VENTRAL HERNIA REPAIR N/A 10/22/2017   Procedure: OPEN REPAIR INCARCERATED VENTRAL HERNIA;  Surgeon: Glenna Fellows, MD;  Location: [facility] OR;  Service: General;  Laterality: N/A;   Patient Active Problem List   Diagnosis Date Noted   Anxiety and depression 07/26/2022   Dysarthria 05/20/2022   Need for diphtheria-tetanus-pertussis (Tdap) vaccine 03/20/2022   Type 2 diabetes mellitus without complication, with long-term current use of insulin (HCC) 01/16/2022   Dyslipidemia 01/16/2022   Chronic diastolic CHF (congestive heart failure) (HCC) 01/16/2022   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    Intracranial hemorrhage (HCC)    HFrEF (heart failure with reduced ejection fraction) (HCC)    Obesity, Class III, BMI 40-49.9 (morbid obesity) (HCC) 05/22/2020   Other cirrhosis of liver (HCC)    Ventral hernia without obstruction or gangrene    Shortness of breath    Neck pain    Prostate cancer screening 01/21/2020   Lymphedema of both lower extremities 10/17/2019   History of stroke 10/17/2019   CKD (chronic kidney disease) stage 3, GFR 30-59 ml/min (HCC) 08/31/2019   Swelling of limb 08/31/2019   DM (diabetes mellitus), type 2 with complications (HCC) 07/08/2019   Vitreous floaters of both eyes 06/03/2018   Moderate obstructive sleep apnea 02/11/2018   Chronic fatigue 12/16/2017   Essential hypertension 12/16/2017   History of small bowel obstruction 10/24/2017   Adjustment disorder  with depressed mood 09/30/2017   Stroke (HCC) 09/14/2017   Hyperlipidemia    Gout    Elevated LFTs    Heart failure with preserved ejection fraction (HCC)    Morbid obesity (HCC)    Hypoalbuminemia 07/11/2017   Type 2 diabetes mellitus with hyperglycemia (HCC) 07/10/2017    ONSET DATE: 05/20/22  REFERRING DIAG: Left sided weakness   THERAPY DIAG:  Muscle weakness (generalized)  Unsteadiness on feet  Difficulty in walking, not elsewhere classified  Other lack of coordination  Rationale for Evaluation and Treatment: Rehabilitation  SUBJECTIVE:                                                                                                                                                                                              SUBJECTIVE STATEMENT: Patient reports for some reason his left knee is flared up today and having more difficulty walking.   Pt accompanied by: self  PERTINENT HISTORY:   Patient presents for Left sided weakness. Patient presented to [facility] on 05/20/22 for L sided weakness and slurred speech.MRI negative.  PMH includes CVA (2019, 2023), obesity, Type II DM, HTN, HLD, lymphedema of BLE, Stage IIIa CKD, anemia, depression, hernia,arthritis, CHF. Patient had multiple strokes in  fall 2023 and received home health. 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 Patient has not worked the past two weeks due to another incident 2-3 weeks ago. Prior to stroke was independent, use a walker occasionally now.   PAIN:  Are you having pain? Yes: NPRS scale: 8/10 Pain location: R lower leg Pain description: aching Aggravating factors: Fall  Relieving factors: sitting down, keeping legs raised, hot shower  PRECAUTIONS: Fall  WEIGHT BEARING RESTRICTIONS: No  FALLS: Has patient fallen in last 6 months? Yes. Number of falls 1  LIVING ENVIRONMENT: Lives with: lives with their family Lives in: House/apartment Stairs: Yes: Internal: flight steps; on right going up Has following equipment at home: Dan Humphreys - 2 wheeled  PLOF: Independent  PATIENT GOALS: to get stronger  OBJECTIVE:   DIAGNOSTIC FINDINGS:   MRI: IMPRESSION: 1. No evidence of acute abnormality. 2. Prior hemorrhages, prior lacunar infarcts, and prior chronic microvascular ischemic disease which is advanced for patient age. Findings could be in part secondary to chronic hypertension.  CTA:   1. No emergent large vessel occlusion. 2. 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 3. Multifocal irregularity and moderate narrowing of the right vertebral artery, mildly  progressed.  COGNITION: Overall cognitive status:  short term memory issues s/p stroke.    SENSATION: Light touch: Impaired LLE  COORDINATION: LLE coordination impaired with delay  and limited coordination.   EDEMA:  Bilateral lymphedema of LE's   POSTURE: rounded shoulders, forward head, and flexed trunk    LOWER EXTREMITY MMT:    MMT Right Eval Left Eval  Hip flexion 3 3  Hip extension    Hip abduction 4- 3+  Hip adduction 4- 3+  Hip internal rotation    Hip external rotation    Knee flexion 3+ 3  Knee extension 3+ 3  Ankle dorsiflexion 4- 3-  Ankle plantarflexion 4- 3+  Ankle inversion    Ankle eversion    (Blank rows = not tested)   TRANSFERS: Assistive device utilized: None  Sit to stand: CGA Stand to sit: CGA Chair to chair: CGA    GAIT: Gait pattern: step through pattern, decreased stride length, antalgic, wide BOS, poor foot clearance- Right, and poor foot clearance- Left Distance walked: 40 ft Assistive device utilized: None Level of assistance: CGA Comments: Patient is unsteady without AD, has antalgic gait pattern with R knee however has limited strength for weightbearing on LLE.   FUNCTIONAL TESTS:  5 times sit to stand: 41 seconds  10 meter walk test: 21 seconds increased knee pain 6/10 Berg Balance Scale: 14/56  PATIENT SURVEYS:  FOTO 44  TODAY'S TREATMENT:                                                                                                                              DATE: 07/29/22         TherEx: -Seated ham curl with RTB x 10 reps - Seated hip add with ball squeeze x 3 sec x 12 reps - Seated knee ext (2.5# AW on right and no weight on left) x 12 reps - Seated hip flex (2.5 AW on right and no weight on left) x 12 reps - seated  heel raise with feet on 1/2 foam to improve muscle recruitment for strength gains. 2 x 15 with 2.5# AW donned  - Seated hip flex/abd/add up and over 1/2 foam- 2.5# AW x 10 reps on left and  no weight on left x 6 reps- stopped due to fatigue.   Patient then completed another round of each above therex at same rep level and resistance.    Pt required occasional rest breaks due fatigue    PATIENT EDUCATION: Education details: goals, POC, HEP Person educated: Patient Education method: Explanation, Demonstration, Tactile cues, and Verbal cues Education comprehension: verbalized understanding, returned demonstration, verbal cues required, and tactile cues required  HOME EXERCISE PROGRAM: Access Code: [email or web address] URL: [email or web address] Date: 05/30/2022 Prepared by: Precious Bard  Exercises - Leg Extension  - 1 x daily - 7 x weekly - 2 sets - 10 reps - 5 hold - Seated March  - 1 x daily - 7 x weekly - 2 sets - 10 reps - 5 hold - Seated Ankle Circles  - 1 x daily - 7 x weekly - 2 sets - 10 reps -  5 hold  GOALS: Goals reviewed with patient? Yes  SHORT TERM GOALS: Target date: 06/27/2022    Patient will be independent in home exercise program to improve strength/mobility for better functional independence with ADLs. Baseline: 2/29: HEP given 4/24: completing 3-4 times per week with no issues  Goal status: MET   LONG TERM GOALS: Target date: 08/22/2022    Patient will increase FOTO score to equal to or greater than  54%   to demonstrate statistically significant improvement in mobility and quality of life.  Baseline: 2/29: 44% 4/24:55% Goal status: MET  2.  Patient (< 9 years old) will complete five times sit to stand test in < 10 seconds indicating an increased LE strength and improved balance. Baseline: 2/29: 41 seconds with significant UE support 07/24/22:19.38 R UE support only  Goal status: IN PROGRESS  3.  Patient will increase Berg Balance score by > 8 points (44/56) to demonstrate decreased fall risk during functional activities. Baseline: 2/29: 14/56 4/24: 36/56 Goal status: Initially met, altered to continue to monitor and decrease fall risk    4.  Patient will increase 10 meter walk test to >1.62m/s as to improve gait speed for better community ambulation and to reduce fall risk. Baseline: 2/29: 21 seconds increased knee pain 4/24:20 sec  Goal status: NOT MET    ASSESSMENT:  CLINICAL IMPRESSION:  Patient arrives with good motivation yet complaining of increased left knee pain prior to treatment today. Today- focused on pain-free seated LE strengthening- Patient does endorse some pain with standing but more fatigue with today's therex. He was able to complete 2 rounds of therex with brief rest breaks.   Pt will continue to benefit from skilled physical therapy intervention to address impairments, improve QOL, and attain therapy goals.     OBJECTIVE IMPAIRMENTS: Abnormal gait, cardiopulmonary status limiting activity, decreased activity tolerance, decreased balance, decreased coordination, decreased endurance, decreased mobility, difficulty walking, decreased strength, increased edema, impaired perceived functional ability, impaired flexibility, impaired sensation, impaired UE functional use, impaired vision/preception, improper body mechanics, postural dysfunction, obesity, and pain.   ACTIVITY LIMITATIONS: carrying, lifting, bending, sitting, standing, squatting, stairs, transfers, bed mobility, bathing, toileting, dressing, reach over head, hygiene/grooming, locomotion level, and caring for others  PARTICIPATION LIMITATIONS: meal prep, cleaning, laundry, personal finances, driving, shopping, community activity, occupation, yard work, school, and church  PERSONAL FACTORS: Age, Behavior pattern, Education, Financial risk analyst, Past/current experiences, Profession, Social background, Time since onset of injury/illness/exacerbation, Transportation, and 3+ comorbidities: CVA (2019, 2023), obesity, Type II DM, HTN, HLD, lymphedema of BLE, Stage IIIa CKD, anemia, depression, hernia,arthritis, CHF  are also affecting patient's functional outcome.    REHAB POTENTIAL: Good  CLINICAL DECISION MAKING: Evolving/moderate complexity  EVALUATION COMPLEXITY: Moderate  PLAN:  PT FREQUENCY: 2x/week  PT DURATION: 12 weeks  PLANNED INTERVENTIONS: Therapeutic exercises, Therapeutic activity, Neuromuscular re-education, Balance training, Gait training, Patient/Family education, Self Care, Joint mobilization, Joint manipulation, Stair training, Vestibular training, Canalith repositioning, Visual/preceptual remediation/compensation, Orthotic/Fit training, DME instructions, Cognitive remediation, Electrical stimulation, Spinal mobilization, Cryotherapy, Manual lymph drainage, Compression bandaging, Taping, Vasopneumatic device, Traction, Ultrasound, Ionotophoresis 4mg /ml Dexamethasone, Manual therapy, and Re-evaluation  PLAN FOR NEXT SESSION: balance, strength, ambulatory endurance training    Lenda Kelp, PT 07/29/2022, 8:44 AM

## 2022-07-31 ENCOUNTER — Ambulatory Visit: Payer: Medicaid Other | Attending: Physician Assistant

## 2022-07-31 ENCOUNTER — Ambulatory Visit: Payer: Medicaid Other

## 2022-07-31 DIAGNOSIS — R262 Difficulty in walking, not elsewhere classified: Secondary | ICD-10-CM | POA: Insufficient documentation

## 2022-07-31 DIAGNOSIS — M6281 Muscle weakness (generalized): Secondary | ICD-10-CM | POA: Insufficient documentation

## 2022-07-31 DIAGNOSIS — R278 Other lack of coordination: Secondary | ICD-10-CM | POA: Insufficient documentation

## 2022-07-31 DIAGNOSIS — R2681 Unsteadiness on feet: Secondary | ICD-10-CM | POA: Insufficient documentation

## 2022-08-05 ENCOUNTER — Ambulatory Visit: Payer: Medicaid Other

## 2022-08-05 ENCOUNTER — Telehealth: Payer: Self-pay

## 2022-08-05 NOTE — Telephone Encounter (Signed)
Left vm with pt and sister to let them know of pt's missed visit today. Reviewed no show policy and reminded pt of his upcoming appointments on Wed 08/07/22 with PT/OT and to please call if he will not be present.   Danelle Earthly, MS, OTR/L

## 2022-08-07 ENCOUNTER — Ambulatory Visit: Payer: Medicaid Other

## 2022-08-07 DIAGNOSIS — R278 Other lack of coordination: Secondary | ICD-10-CM | POA: Diagnosis not present

## 2022-08-07 DIAGNOSIS — M6281 Muscle weakness (generalized): Secondary | ICD-10-CM

## 2022-08-07 DIAGNOSIS — R2681 Unsteadiness on feet: Secondary | ICD-10-CM | POA: Diagnosis not present

## 2022-08-07 DIAGNOSIS — R262 Difficulty in walking, not elsewhere classified: Secondary | ICD-10-CM

## 2022-08-07 NOTE — Therapy (Unsigned)
Occupational Therapy Neuro Treatment Note  Patient Name: Jeremiah Vance MRN: [id number] DOB:04/17/1970, 52 y.o., male Today's Date: 08/07/2022  PCP: Jeremiah Lat, PA-C REFERRING PROVIDER: Debera Lat, PA-C   OT End of Session - 08/07/22 1611     Visit Number 11    Number of Visits 24    Date for OT Re-Evaluation 10/21/22    Progress Note Due on Visit 10    OT Start Time 1545    OT Stop Time 1630    OT Time Calculation (min) 45 min    Equipment Utilized During Treatment none    Activity Tolerance Patient tolerated treatment well    Behavior During Therapy [facility] for tasks assessed/performed            Past Medical History:  Diagnosis Date   Acute ischemic stroke (HCC) 09/06/2017   Acute renal failure superimposed on stage 3a chronic kidney disease (HCC) 07/08/2019   Acute right-sided weakness    Allergies    Anasarca    Anemia 07/10/2017   Arthritis    CHF (congestive heart failure) (HCC)    "coinsided w/kidney problems I was having 06/2017"   Chicken pox    CKD (chronic kidney disease) stage 3, GFR 30-59 ml/min (HCC) 06/2017   Depression    Elevated troponin 07/08/2019   High cholesterol    History of cardiomyopathy    LVEF 40 to 45% in April 2019 - subsequently normalized   Hyperbilirubinemia 07/10/2017   Hypertension    Ischemic stroke (HCC)    Small left internal capsule infarct due to lacunar disease   Morbid obesity (HCC)    Normocytic anemia 12/16/2017   Recurrent incisional hernia with incarceration s/p repair 10/22/2017 10/21/2017   Stroke (HCC) 08/2017   "right sided weakness since; getting stronger though" (10/22/2017)   Type 2 diabetes mellitus (HCC)    Past Surgical History:  Procedure Laterality Date   ABDOMINAL HERNIA REPAIR  2008; 10/22/2017   "scope; OPEN REPAIR INCARCERATED VENTRAL HERNIA   HERNIA REPAIR     KNEE ARTHROSCOPY Right 1989   VENTRAL HERNIA REPAIR N/A 10/22/2017   Procedure: OPEN REPAIR INCARCERATED VENTRAL HERNIA;  Surgeon: Jeremiah Fellows, MD;  Location: [facility] OR;  Service: General;  Laterality: N/A;   Patient Active Problem List   Diagnosis Date Noted   Anxiety and depression 07/26/2022   Dysarthria 05/20/2022   Need for diphtheria-tetanus-pertussis (Tdap) vaccine 03/20/2022   Type 2 diabetes mellitus without complication, with long-term current use of insulin (HCC) 01/16/2022   Dyslipidemia 01/16/2022   Chronic diastolic CHF (congestive heart failure) (HCC) 01/16/2022   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    Intracranial hemorrhage (HCC)    HFrEF (heart failure with reduced ejection fraction) (HCC)    Obesity, Class III, BMI 40-49.9 (morbid obesity) (HCC) 05/22/2020   Other cirrhosis of liver (HCC)    Ventral hernia without obstruction or gangrene    Shortness of breath    Neck pain    Prostate cancer screening 01/21/2020   Lymphedema of both lower extremities 10/17/2019   History of stroke 10/17/2019   CKD (chronic kidney disease) stage 3, GFR 30-59 ml/min (HCC) 08/31/2019   Swelling of limb 08/31/2019   DM (diabetes mellitus), type 2 with complications (HCC) 07/08/2019   Vitreous floaters of both eyes 06/03/2018   Moderate obstructive sleep apnea 02/11/2018   Chronic fatigue 12/16/2017   Essential hypertension 12/16/2017   History of small bowel obstruction 10/24/2017   Adjustment disorder with  depressed mood 09/30/2017   Stroke (HCC) 09/14/2017   Hyperlipidemia    Gout    Elevated LFTs    Heart failure with preserved ejection fraction (HCC)    Morbid obesity (HCC)    Hypoalbuminemia 07/11/2017   Type 2 diabetes mellitus with hyperglycemia (HCC) 07/10/2017   ONSET DATE: Aug 2023  REFERRING DIAG: L sided weakness  THERAPY DIAG:  Muscle weakness (generalized)  Other lack of coordination  Rationale for Evaluation and Treatment: Rehabilitation  SUBJECTIVE:  SUBJECTIVE STATEMENT:Pt reports that he missed his appointment earlier in the week as he was not  feeling well.  Pt reports feeling better today.  PERTINENT HISTORY: Pt reports 2-3 CVAs in Aug of 2023, but a total of 5 CVAs over the course of 4 years.  After the strokes in Aug, pt participated in [facility] therapy for 2-3 months.  Pt reports that he had been able to return to work up until 05/20/22.  Pt reported that his work felt like he was exhibiting stroke symptoms, so he went to the ED.  Per chart from ED visit on 05/20/22:     Clinical Course as of 05/20/22 1559  Mon May 20, 2022  1505 Prior CVA with soft blood pressures - reactivation of old CVA.  CTA negative. Code stroke and if MRI negative then will dc home.  [SM]     Clinical Course User Index [SM] Jeremiah Herter, MD  MRI negative.  Patient states that he is feeling better.  Blood pressure improved while in the emergency department.  Last blood pressure 130 systolic.  Patient discharged home in stable condition with discussion to follow-up as an outpatient and return precautions given.   Jeremiah Herter, MD 05/20/22 1733     PRECAUTIONS: fall  WEIGHT BEARING RESTRICTIONS: No  PAIN:  Are you having pain? Yes: NPRS scale: 7/10 Pain location: L shoulder and bilat knees Pain description: achy Aggravating factors: cold weather, walking, stairs Relieving factors: rest, elevation  FALLS: Has patient fallen in last 6 months? Yes. 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  PATIENT GOALS: To get the L side stronger and return to work.   OBJECTIVE:   HAND DOMINANCE:  Right  ADLs: Overall ADLs: modified indep with primarily use of the R arm. Transfers/ambulation related to ADLs: uses the walker outside, indoors no walker Eating: sister cuts food  Grooming: indep UB Dressing: extra time to manage clothing fasteners LB Dressing: slip on shoes; tucks laces into shoes Toileting: indep Bathing: modified indep Tub Shower transfers: walk in shower Equipment: see above  IADLs: Shopping: pt can accompany sister and pt pushes the cart Light housekeeping: shared task between pt and sister  Meal Prep: some difficulty opening new packages/containers but can manage simple light meal prep/microwavable meals  Community mobility: able to drive, limited ambulator d/t obesity, arthritic knees, and L sided weakness Medication management: sister sets up pills into weekly pill organizer  Financial management: pt manages  Handwriting: No issues (pt is R hand dominant)  POSTURE COMMENTS:  rounded shoulders  ACTIVITY TOLERANCE: Activity tolerance: fair; limited community ambulator  FUNCTIONAL OUTCOME MEASURES: FOTO: 52; predicted 55  UPPER EXTREMITY ROM:    Active ROM Right Eval WNL Left eval Left 07/29/2022  Shoulder flexion  98 (limited by pain) 121  Shoulder abduction  102 110  Shoulder adduction     Shoulder extension     Shoulder internal rotation     Shoulder external rotation     Elbow flexion     Elbow extension     Wrist flexion     Wrist extension     Wrist ulnar deviation     Wrist radial deviation     Wrist pronation     Wrist supination     (Blank rows = not tested)  UPPER EXTREMITY MMT:     MMT Right eval Left eval Left  07/29/2022  Shoulder flexion 4+ NT (pain) 4-/5  Shoulder abduction 4 NT (pain) 3/5  Shoulder adduction     Shoulder extension     Shoulder internal rotation     Shoulder external rotation     Middle trapezius     Lower trapezius     Elbow flexion 5 4- 4/5  Elbow extension 5 4- 4/5  Wrist flexion 5 3+ 4-/5   Wrist extension 5 3+ 4-/5  Wrist ulnar deviation     Wrist radial deviation     Wrist pronation     Wrist supination     (Blank rows = not tested)  HAND FUNCTION: Grip strength: Right: 71 lbs; Left: 27 lbs, Lateral pinch: Right: 16 lbs, Left: 6 lbs, and 3 point pinch: Right: 18 lbs, Left: 8 lbs  07/29/2022:  Grip strength: Right: 71 lbs; Left: 40 lbs, Lateral pinch: Right: 16 lbs, Left: 7 lbs, and 3 point pinch: Right: 18 lbs, Left: 6 lbs  COORDINATION: Finger Nose Finger test: L sided good accuracy but slower  9 Hole Peg test: Right: 38 sec; Left: 46 sec Moderate difficulty opposing L hand digits to thumb   07/29/2022:  9-Hole Peg test: Right: 51 sec; Left: 51 sec. Pt. scores affecting by limited vision. 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, wears reading glasses all the time and reports increased blurred vision in the R eye since strokes in Aug of 2023  VISION ASSESSMENT: Tracking/Visual pursuits: impaired Saccades: impaired Visual Fields: pt limited in all visual fields of R eye (chart notes floaters both eyes)  Patient has difficulty with following activities due to following visual impairments: pt does not drive  PERCEPTION: WFL  PRAXIS: Impaired: Motor planning  TODAY'S TREATMENT:    Therapeutic Exercise: Facilitated hand strengthening with use of hand gripper set at 11.2# for 1 trial and 17.9# for 2 more trials to remove jumbo pegs from pegboard using L hand.  Pt required initial vc to ensure L SF was engaged in each gripping rep, and intermittent assist to reposition gripper in hand reduce dropped pegs.  Facilitated L wrist, and  hand strengthening with participation in EZ board tools.  Pt worked with long handled tool to facilitate L wrist flex/ext, large base key turn, and small and large dial turn x3 reps for each tool (  up/down board=1 rep).  Rest breaks between sets and min vc for technique and intermittent assist to keep tools level on velcro strip.  Therapeutic Activity: Faciltiated pinch strengthening and coordination skills working to snap and unsnap a string of pop beads.  Pt then practiced picking up 1 bead at a time using a tip pinch, lateral pinch, and a 3 point pinch, storing up to 8-10 in hand, then discarding beads 1 at a time.    PATIENT EDUCATION: Education details: LUE strengthening and coordination training Person educated: Patient Education method: Explanation and Verbal cues Education comprehension: verbalized understanding  HOME EXERCISE PROGRAM: Theraputty, theraband  GOALS: Goals reviewed with patient? Yes  SHORT TERM GOALS: Target date:09/09/2022   Pt will be indep to perform HEP for increasing strength and coordination throughout the LUE. Baseline: 07/29/2022: Independent however required occasional visual cuing.  Eval: issued putty exercises Goal status: Partially met   LONG TERM GOALS: Target date: 10/21/2022   Pt will increase FOTO score to 55 or better to indicate clinically relevant improvement in self perceived use of L arm to perform daily tasks.  Baseline: 07/29/2022: 52 Eval: 52 Goal status: Ongoing   2.  Pt will increase L grip strength by 10 or more lbs in order to hold and carry ADL supplies in L hand. Baseline: 07/29/2022: Left grip: 40 lbs. Eval: L grip 27 lbs (R 71 lbs) Goal status: Ongoing  3.  Pt will increase LUE strength at the elbow and wrist in order to carry a heavy laundry basket with BUEs. Baseline: 07/29/2022:  L elbow flex 4, wrist flex 4- Eval: L elbow flex 4-, wrist flex 3+ Goal status: Ongoing  4.  Pt will increase L lateral pinch strength in order to  ease ability to open small bottles and food packages.  Baseline: 07/29/2022:  L 7 lbs Eval: L 6 lbs (R 16 lbs) Goal status:Ongoing  5.  Pt will increase L FMC/dexterity skills to ease ability to manipulate small ADL supplies.  Baseline:07/29/2022: L 9 hole: 51 sec (R 51 sec) Pt. Is able to grasp larger pills easier. Eval: L 9 hole 48 sec (R 36 sec); extra time to manage clothing fasteners and to pick up pills Goal status: Ongoing   ASSESSMENT: CLINICAL IMPRESSION: Pt reports that he missed his appointment earlier in the week as he was not feeling well.  Pt reports feeling better today.  OT reminded pt of no call/no show policy and reminded pt to call when he is unable to make his appointments.  Pt reported that he forgot to call but verbalized understanding.  Pt arrived without walker today, appearing unsteady.  OT encouraged pt utilize his walker for these longer therapy clinic distances in order to reduce his fall risk.  PT also reinforced this for fall prevention.  Pt reports that he is trying to be independent but therapist reinforced importance of walker use for safety/stability.  Pt acknowledged education.  Continued focus on L hand strengthening and coordination skills this date.  Pt practiced storing small beads in L hand, though did have frequent dropping from ulnar side of hand once reaching 8-10 beads in hand.  Pt demos good ability to pick up a bead with a lateral, 2 point, and 3 point pinch, but storage and translatory skills continue to present as a challenge.  Pt  continues to work towards goals in plan of care into the next recertification period to maximize safety and independence in necessary daily tasks.    PERFORMANCE  DEFICITS: in functional skills including ADLs, IADLs, coordination, dexterity, sensation, strength, pain, Fine motor control, Gross motor control, mobility, balance, body mechanics, endurance, decreased knowledge of use of DME, vision, and UE functional use, and  psychosocial skills including coping strategies, environmental adaptation, habits, and routines and behaviors.   IMPAIRMENTS: are limiting patient from ADLs and IADLs.   CO-MORBIDITIES: has co-morbidities such as legally blind L eye, obesity, arthritis  that affects occupational performance. Patient will benefit from skilled OT to address above impairments and improve overall function.  MODIFICATION OR ASSISTANCE TO COMPLETE EVALUATION: No modification of tasks or assist necessary to complete an evaluation.  OT OCCUPATIONAL PROFILE AND HISTORY: Problem focused assessment: Including review of records relating to presenting problem.  CLINICAL DECISION MAKING: Moderate - several treatment options, min-mod task modification necessary  REHAB POTENTIAL: Good  EVALUATION COMPLEXITY: Moderate  PLAN:  OT FREQUENCY: 2x/week  OT DURATION: 12 weeks  PLANNED INTERVENTIONS: self care/ADL training, therapeutic exercise, therapeutic activity, neuromuscular re-education, manual therapy, passive range of motion, balance training, functional mobility training, moist heat, cryotherapy, contrast bath, patient/family education, energy conservation, coping strategies training, and DME and/or AE instructions  RECOMMENDED OTHER SERVICES: None at this time  CONSULTED AND AGREED WITH PLAN OF CARE: Patient  PLAN FOR NEXT SESSION: see above  Danelle Earthly, MS, OTR/L

## 2022-08-07 NOTE — Therapy (Signed)
OUTPATIENT PHYSICAL THERAPY NEURO TREATMENT   Patient Name: Jeremiah Vance MRN: [id number] DOB:[birth year], 52 y.o., male Today's Date: 08/07/2022   PCP: Debera Lat PA REFERRING PROVIDER: Debera Lat PA  END OF SESSION:  PT End of Session - 08/07/22 1554     Visit Number 12    Number of Visits 24    Date for PT Re-Evaluation 08/22/22    Progress Note Due on Visit 20    PT Start Time 1630    PT Stop Time 1714    PT Time Calculation (min) 44 min    Equipment Utilized During Treatment Gait belt    Activity Tolerance Patient tolerated treatment well;Patient limited by pain    Behavior During Therapy [facility] for tasks assessed/performed                     Past Medical History:  Diagnosis Date   Acute ischemic stroke (HCC) 09/06/2017   Acute renal failure superimposed on stage 3a chronic kidney disease (HCC) 07/08/2019   Acute right-sided weakness    Allergies    Anasarca    Anemia 07/10/2017   Arthritis    CHF (congestive heart failure) (HCC)    "coinsided w/kidney problems I was having 06/2017"   Chicken pox    CKD (chronic kidney disease) stage 3, GFR 30-59 ml/min (HCC) 06/2017   Depression    Elevated troponin 07/08/2019   High cholesterol    History of cardiomyopathy    LVEF 40 to 45% in April 2019 - subsequently normalized   Hyperbilirubinemia 07/10/2017   Hypertension    Ischemic stroke (HCC)    Small left internal capsule infarct due to lacunar disease   Morbid obesity (HCC)    Normocytic anemia 12/16/2017   Recurrent incisional hernia with incarceration s/p repair 10/22/2017 10/21/2017   Stroke (HCC) 08/2017   "right sided weakness since; getting stronger though" (10/22/2017)   Type 2 diabetes mellitus (HCC)    Past Surgical History:  Procedure Laterality Date   ABDOMINAL HERNIA REPAIR  2008; 10/22/2017   "scope; OPEN REPAIR INCARCERATED VENTRAL HERNIA   HERNIA REPAIR     KNEE ARTHROSCOPY Right 1989   VENTRAL HERNIA REPAIR N/A 10/22/2017   Procedure:  OPEN REPAIR INCARCERATED VENTRAL HERNIA;  Surgeon: Glenna Fellows, MD;  Location: [facility] OR;  Service: General;  Laterality: N/A;   Patient Active Problem List   Diagnosis Date Noted   Anxiety and depression 07/26/2022   Dysarthria 05/20/2022   Need for diphtheria-tetanus-pertussis (Tdap) vaccine 03/20/2022   Type 2 diabetes mellitus without complication, with long-term current use of insulin (HCC) 01/16/2022   Dyslipidemia 01/16/2022   Chronic diastolic CHF (congestive heart failure) (HCC) 01/16/2022   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    Intracranial hemorrhage (HCC)    HFrEF (heart failure with reduced ejection fraction) (HCC)    Obesity, Class III, BMI 40-49.9 (morbid obesity) (HCC) 05/22/2020   Other cirrhosis of liver (HCC)    Ventral hernia without obstruction or gangrene    Shortness of breath    Neck pain    Prostate cancer screening 01/21/2020   Lymphedema of both lower extremities 10/17/2019   History of stroke 10/17/2019   CKD (chronic kidney disease) stage 3, GFR 30-59 ml/min (HCC) 08/31/2019   Swelling of limb 08/31/2019   DM (diabetes mellitus), type 2 with complications (HCC) 07/08/2019   Vitreous floaters of both eyes 06/03/2018   Moderate obstructive sleep apnea 02/11/2018   Chronic fatigue 12/16/2017  Essential hypertension 12/16/2017   History of small bowel obstruction 10/24/2017   Adjustment disorder with depressed mood 09/30/2017   Stroke (HCC) 09/14/2017   Hyperlipidemia    Gout    Elevated LFTs    Heart failure with preserved ejection fraction (HCC)    Morbid obesity (HCC)    Hypoalbuminemia 07/11/2017   Type 2 diabetes mellitus with hyperglycemia (HCC) 07/10/2017    ONSET DATE: 05/20/22  REFERRING DIAG: Left sided weakness   THERAPY DIAG:  Muscle weakness (generalized)  Unsteadiness on feet  Difficulty in walking, not elsewhere classified  Rationale for Evaluation and Treatment:  Rehabilitation  SUBJECTIVE:                                                                                                                                                                                             SUBJECTIVE STATEMENT: Patient missed last session due to being ill. Came without his walker today.  No falls since last seen.  Pt accompanied by: self  PERTINENT HISTORY:   Patient presents for Left sided weakness. Patient presented to [facility] on 05/20/22 for L sided weakness and slurred speech.MRI negative.  PMH includes CVA (2019, 2023), obesity, Type II DM, HTN, HLD, lymphedema of BLE, Stage IIIa CKD, anemia, depression, hernia,arthritis, CHF. Patient had multiple strokes in  fall 2023 and received home health. 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 Patient has not worked the past two weeks due to another incident 2-3 weeks ago. Prior to stroke was independent, use a walker occasionally now.   PAIN:  Are you having pain? Yes: NPRS scale: 8/10 Pain location: R lower leg Pain description: aching Aggravating factors: Fall  Relieving factors: sitting down, keeping legs raised, hot shower  PRECAUTIONS: Fall  WEIGHT BEARING RESTRICTIONS: No  FALLS: Has patient fallen in last 6 months? Yes. Number of falls 1  LIVING ENVIRONMENT: Lives with: lives with their family Lives in: House/apartment Stairs: Yes: Internal: flight steps; on right going up Has following equipment at home: Dan Humphreys - 2 wheeled  PLOF: Independent  PATIENT GOALS: to get stronger  OBJECTIVE:   DIAGNOSTIC FINDINGS:   MRI: IMPRESSION: 1. No evidence of acute abnormality. 2. Prior hemorrhages, prior lacunar infarcts, and prior chronic microvascular ischemic disease which is advanced for patient age. Findings could be in part secondary to chronic hypertension.  CTA:   1. No emergent large vessel occlusion. 2. 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 3. Multifocal irregularity and  moderate narrowing of the right vertebral artery, mildly progressed.  COGNITION: Overall cognitive status:  short term memory issues s/p stroke.    SENSATION:  Light touch: Impaired LLE  COORDINATION: LLE coordination impaired with delay and limited coordination.   EDEMA:  Bilateral lymphedema of LE's   POSTURE: rounded shoulders, forward head, and flexed trunk    LOWER EXTREMITY MMT:    MMT Right Eval Left Eval  Hip flexion 3 3  Hip extension    Hip abduction 4- 3+  Hip adduction 4- 3+  Hip internal rotation    Hip external rotation    Knee flexion 3+ 3  Knee extension 3+ 3  Ankle dorsiflexion 4- 3-  Ankle plantarflexion 4- 3+  Ankle inversion    Ankle eversion    (Blank rows = not tested)   TRANSFERS: Assistive device utilized: None  Sit to stand: CGA Stand to sit: CGA Chair to chair: CGA    GAIT: Gait pattern: step through pattern, decreased stride length, antalgic, wide BOS, poor foot clearance- Right, and poor foot clearance- Left Distance walked: 40 ft Assistive device utilized: None Level of assistance: CGA Comments: Patient is unsteady without AD, has antalgic gait pattern with R knee however has limited strength for weightbearing on LLE.   FUNCTIONAL TESTS:  5 times sit to stand: 41 seconds  10 meter walk test: 21 seconds increased knee pain 6/10 Berg Balance Scale: 14/56  PATIENT SURVEYS:  FOTO 44  TODAY'S TREATMENT:                                                                                                                              DATE: 08/07/22         TherEx:    Seated with # 2.5 ankle weights  -Seated marches with upright posture, back away from back of chair for abdominal/trunk activation/stabilization, 10x each LE -Seated LAQ with 3 second holds, 10x each LE, cueing for muscle activation and sequencing for neutral alignment -Seated IR/ER with cueing for stabilizing knee placement with lateral foot movement for  optimal muscle recruitment, 10x each LE -heel raise 20x   Standing: Step over half foam roller 10x each LE with BUE support  Ambulate with RW with CGA x500 ft up to entrance of mall. Close cueing for safety required.   Seated: Heel toe raises on half foam roller 15x Adduction ball squeeze 15x   Patient then completed another round of each above therex at same rep level and resistance.   Neuro Re-ed: Activity Description: red=right leg, green-left leg Activity Setting:  The Blaze Pod Random setting was chosen to enhance cognitive processing and agility, providing an unpredictable environment to simulate real-world scenarios, and fostering quick reactions and adaptability.  Number of Pods:  4 Cycles/Sets:  3 Duration (Time or Hit Count):  30 seconds  Patient Stats   Hits:   10, 13, 21   Standing with CGA next to support surface:  Airex pad: static stand 30 seconds x 2 trials, noticeable trembling of ankles/LE's with fatigue and challenge to maintain stability Airex pad: horizontal head turns 30 seconds  scanning room 10x ; cueing for arc of motion  Airex pad: vertical head turns 30 seconds, cueing for arc of motion, noticeable sway with upward gaze increasing demand on ankle righting reaction musculature   Pt required occasional rest breaks due fatigue    PATIENT EDUCATION: Education details: goals, POC, HEP Person educated: Patient Education method: Explanation, Demonstration, Tactile cues, and Verbal cues Education comprehension: verbalized understanding, returned demonstration, verbal cues required, and tactile cues required  HOME EXERCISE PROGRAM: Access Code: [email or web address] URL: [email or web address] Date: 05/30/2022 Prepared by: Precious Bard  Exercises - Leg Extension  - 1 x daily - 7 x weekly - 2 sets - 10 reps - 5 hold - Seated March  - 1 x daily - 7 x weekly - 2 sets - 10 reps - 5 hold - Seated Ankle Circles  - 1 x daily - 7 x weekly - 2 sets - 10 reps -  5 hold  GOALS: Goals reviewed with patient? Yes  SHORT TERM GOALS: Target date: 06/27/2022    Patient will be independent in home exercise program to improve strength/mobility for better functional independence with ADLs. Baseline: 2/29: HEP given 4/24: completing 3-4 times per week with no issues  Goal status: MET   LONG TERM GOALS: Target date: 08/22/2022    Patient will increase FOTO score to equal to or greater than  54%   to demonstrate statistically significant improvement in mobility and quality of life.  Baseline: 2/29: 44% 4/24:55% Goal status: MET  2.  Patient (< 48 years old) will complete five times sit to stand test in < 10 seconds indicating an increased LE strength and improved balance. Baseline: 2/29: 41 seconds with significant UE support 07/24/22:19.38 R UE support only  Goal status: IN PROGRESS  3.  Patient will increase Berg Balance score by > 8 points (44/56) to demonstrate decreased fall risk during functional activities. Baseline: 2/29: 14/56 4/24: 36/56 Goal status: Initially met, altered to continue to monitor and decrease fall risk   4.  Patient will increase 10 meter walk test to >1.38m/s as to improve gait speed for better community ambulation and to reduce fall risk. Baseline: 2/29: 21 seconds increased knee pain 4/24:20 sec  Goal status: NOT MET    ASSESSMENT:  CLINICAL IMPRESSION:  Patient educated on HEP compliance and attendance of sessions. Patient verbalized understanding. Educated on need to use walker for standing. Patient requires multiple seated rest breaks due to legs wanting to give out.  Pt will continue to benefit from skilled physical therapy intervention to address impairments, improve QOL, and attain therapy goals.     OBJECTIVE IMPAIRMENTS: Abnormal gait, cardiopulmonary status limiting activity, decreased activity tolerance, decreased balance, decreased coordination, decreased endurance, decreased mobility, difficulty walking,  decreased strength, increased edema, impaired perceived functional ability, impaired flexibility, impaired sensation, impaired UE functional use, impaired vision/preception, improper body mechanics, postural dysfunction, obesity, and pain.   ACTIVITY LIMITATIONS: carrying, lifting, bending, sitting, standing, squatting, stairs, transfers, bed mobility, bathing, toileting, dressing, reach over head, hygiene/grooming, locomotion level, and caring for others  PARTICIPATION LIMITATIONS: meal prep, cleaning, laundry, personal finances, driving, shopping, community activity, occupation, yard work, school, and church  PERSONAL FACTORS: Age, Behavior pattern, Education, Financial risk analyst, Past/current experiences, Profession, Social background, Time since onset of injury/illness/exacerbation, Transportation, and 3+ comorbidities: CVA (2019, 2023), obesity, Type II DM, HTN, HLD, lymphedema of BLE, Stage IIIa CKD, anemia, depression, hernia,arthritis, CHF  are also affecting patient's functional outcome.   REHAB POTENTIAL: Good  CLINICAL DECISION MAKING: Evolving/moderate  complexity  EVALUATION COMPLEXITY: Moderate  PLAN:  PT FREQUENCY: 2x/week  PT DURATION: 12 weeks  PLANNED INTERVENTIONS: Therapeutic exercises, Therapeutic activity, Neuromuscular re-education, Balance training, Gait training, Patient/Family education, Self Care, Joint mobilization, Joint manipulation, Stair training, Vestibular training, Canalith repositioning, Visual/preceptual remediation/compensation, Orthotic/Fit training, DME instructions, Cognitive remediation, Electrical stimulation, Spinal mobilization, Cryotherapy, Manual lymph drainage, Compression bandaging, Taping, Vasopneumatic device, Traction, Ultrasound, Ionotophoresis 4mg /ml Dexamethasone, Manual therapy, and Re-evaluation  PLAN FOR NEXT SESSION: balance, strength, ambulatory endurance training    Precious Bard, PT 08/07/2022, 5:16 PM

## 2022-08-12 ENCOUNTER — Ambulatory Visit: Payer: Medicaid Other

## 2022-08-14 ENCOUNTER — Ambulatory Visit: Payer: Medicaid Other

## 2022-08-14 DIAGNOSIS — M6281 Muscle weakness (generalized): Secondary | ICD-10-CM | POA: Diagnosis not present

## 2022-08-14 DIAGNOSIS — R278 Other lack of coordination: Secondary | ICD-10-CM

## 2022-08-14 DIAGNOSIS — R262 Difficulty in walking, not elsewhere classified: Secondary | ICD-10-CM | POA: Diagnosis not present

## 2022-08-14 DIAGNOSIS — R2681 Unsteadiness on feet: Secondary | ICD-10-CM | POA: Diagnosis not present

## 2022-08-14 NOTE — Therapy (Signed)
OUTPATIENT PHYSICAL THERAPY NEURO TREATMENT   Patient Name: Jeremiah Vance MRN: [id number] DOB:March 09, 1971, 52 y.o., male Today's Date: 08/14/2022   PCP: Debera Lat PA REFERRING PROVIDER: Debera Lat PA  END OF SESSION:  PT End of Session - 08/14/22 1559     Visit Number 13    Number of Visits 24    Date for PT Re-Evaluation 08/22/22    Progress Note Due on Visit 20    PT Start Time 1645    PT Stop Time 1729    PT Time Calculation (min) 44 min    Equipment Utilized During Treatment Gait belt    Activity Tolerance Patient tolerated treatment well;Patient limited by pain    Behavior During Therapy [facility] for tasks assessed/performed                      Past Medical History:  Diagnosis Date   Acute ischemic stroke (HCC) 09/06/2017   Acute renal failure superimposed on stage 3a chronic kidney disease (HCC) 07/08/2019   Acute right-sided weakness    Allergies    Anasarca    Anemia 07/10/2017   Arthritis    CHF (congestive heart failure) (HCC)    "coinsided w/kidney problems I was having 06/2017"   Chicken pox    CKD (chronic kidney disease) stage 3, GFR 30-59 ml/min (HCC) 06/2017   Depression    Elevated troponin 07/08/2019   High cholesterol    History of cardiomyopathy    LVEF 40 to 45% in April 2019 - subsequently normalized   Hyperbilirubinemia 07/10/2017   Hypertension    Ischemic stroke (HCC)    Small left internal capsule infarct due to lacunar disease   Morbid obesity (HCC)    Normocytic anemia 12/16/2017   Recurrent incisional hernia with incarceration s/p repair 10/22/2017 10/21/2017   Stroke (HCC) 08/2017   "right sided weakness since; getting stronger though" (10/22/2017)   Type 2 diabetes mellitus (HCC)    Past Surgical History:  Procedure Laterality Date   ABDOMINAL HERNIA REPAIR  2008; 10/22/2017   "scope; OPEN REPAIR INCARCERATED VENTRAL HERNIA   HERNIA REPAIR     KNEE ARTHROSCOPY Right 1989   VENTRAL HERNIA REPAIR N/A 10/22/2017    Procedure: OPEN REPAIR INCARCERATED VENTRAL HERNIA;  Surgeon: Glenna Fellows, MD;  Location: [facility] OR;  Service: General;  Laterality: N/A;   Patient Active Problem List   Diagnosis Date Noted   Anxiety and depression 07/26/2022   Dysarthria 05/20/2022   Need for diphtheria-tetanus-pertussis (Tdap) vaccine 03/20/2022   Type 2 diabetes mellitus without complication, with long-term current use of insulin (HCC) 01/16/2022   Dyslipidemia 01/16/2022   Chronic diastolic CHF (congestive heart failure) (HCC) 01/16/2022   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    Intracranial hemorrhage (HCC)    HFrEF (heart failure with reduced ejection fraction) (HCC)    Obesity, Class III, BMI 40-49.9 (morbid obesity) (HCC) 05/22/2020   Other cirrhosis of liver (HCC)    Ventral hernia without obstruction or gangrene    Shortness of breath    Neck pain    Prostate cancer screening 01/21/2020   Lymphedema of both lower extremities 10/17/2019   History of stroke 10/17/2019   CKD (chronic kidney disease) stage 3, GFR 30-59 ml/min (HCC) 08/31/2019   Swelling of limb 08/31/2019   DM (diabetes mellitus), type 2 with complications (HCC) 07/08/2019   Vitreous floaters of both eyes 06/03/2018   Moderate obstructive sleep apnea 02/11/2018   Chronic fatigue  12/16/2017   Essential hypertension 12/16/2017   History of small bowel obstruction 10/24/2017   Adjustment disorder with depressed mood 09/30/2017   Stroke (HCC) 09/14/2017   Hyperlipidemia    Gout    Elevated LFTs    Heart failure with preserved ejection fraction (HCC)    Morbid obesity (HCC)    Hypoalbuminemia 07/11/2017   Type 2 diabetes mellitus with hyperglycemia (HCC) 07/10/2017    ONSET DATE: 05/20/22  REFERRING DIAG: Left sided weakness   THERAPY DIAG:  Muscle weakness (generalized)  Unsteadiness on feet  Difficulty in walking, not elsewhere classified  Rationale for Evaluation and Treatment:  Rehabilitation  SUBJECTIVE:                                                                                                                                                                                             SUBJECTIVE STATEMENT: Patient reports compliance with HEP. Nothing new to report.  Pt accompanied by: self  PERTINENT HISTORY:   Patient presents for Left sided weakness. Patient presented to [facility] on 05/20/22 for L sided weakness and slurred speech.MRI negative.  PMH includes CVA (2019, 2023), obesity, Type II DM, HTN, HLD, lymphedema of BLE, Stage IIIa CKD, anemia, depression, hernia,arthritis, CHF. Patient had multiple strokes in  fall 2023 and received home health. 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 Patient has not worked the past two weeks due to another incident 2-3 weeks ago. Prior to stroke was independent, use a walker occasionally now.   PAIN:  Are you having pain? Yes: NPRS scale: 8/10 Pain location: R lower leg Pain description: aching Aggravating factors: Fall  Relieving factors: sitting down, keeping legs raised, hot shower  PRECAUTIONS: Fall  WEIGHT BEARING RESTRICTIONS: No  FALLS: Has patient fallen in last 6 months? Yes. Number of falls 1  LIVING ENVIRONMENT: Lives with: lives with their family Lives in: House/apartment Stairs: Yes: Internal: flight steps; on right going up Has following equipment at home: Dan Humphreys - 2 wheeled  PLOF: Independent  PATIENT GOALS: to get stronger  OBJECTIVE:   DIAGNOSTIC FINDINGS:   MRI: IMPRESSION: 1. No evidence of acute abnormality. 2. Prior hemorrhages, prior lacunar infarcts, and prior chronic microvascular ischemic disease which is advanced for patient age. Findings could be in part secondary to chronic hypertension.  CTA:   1. No emergent large vessel occlusion. 2. 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 3. Multifocal irregularity and moderate narrowing of the right vertebral  artery, mildly progressed.  COGNITION: Overall cognitive status:  short term memory issues s/p stroke.    SENSATION: Light touch: Impaired LLE  COORDINATION: LLE  coordination impaired with delay and limited coordination.   EDEMA:  Bilateral lymphedema of LE's   POSTURE: rounded shoulders, forward head, and flexed trunk    LOWER EXTREMITY MMT:    MMT Right Eval Left Eval  Hip flexion 3 3  Hip extension    Hip abduction 4- 3+  Hip adduction 4- 3+  Hip internal rotation    Hip external rotation    Knee flexion 3+ 3  Knee extension 3+ 3  Ankle dorsiflexion 4- 3-  Ankle plantarflexion 4- 3+  Ankle inversion    Ankle eversion    (Blank rows = not tested)   TRANSFERS: Assistive device utilized: None  Sit to stand: CGA Stand to sit: CGA Chair to chair: CGA    GAIT: Gait pattern: step through pattern, decreased stride length, antalgic, wide BOS, poor foot clearance- Right, and poor foot clearance- Left Distance walked: 40 ft Assistive device utilized: None Level of assistance: CGA Comments: Patient is unsteady without AD, has antalgic gait pattern with R knee however has limited strength for weightbearing on LLE.   FUNCTIONAL TESTS:  5 times sit to stand: 41 seconds  10 meter walk test: 21 seconds increased knee pain 6/10 Berg Balance Scale: 14/56  PATIENT SURVEYS:  FOTO 44  TODAY'S TREATMENT:                                                                                                                              DATE: 08/14/22         TherEx:  NuStep: lvl 2 minute; seat position 11, x4 minutes cues for keeping keeping SPM>30.   Seated with # 2.5 ankle weights ; 2 sets  -Seated marches with upright posture, back away from back of chair for abdominal/trunk activation/stabilization, 10x each LE -Seated LAQ with 3 second holds, 10x each LE, cueing for muscle activation and sequencing for neutral alignment -Seated IR/ER with cueing for stabilizing  knee placement with lateral foot movement for optimal muscle recruitment, 10x each LE -heel raise 20x   Standing: Step over three consecutive foam rollers BUE support; x4 trials with seated rest breaks   Ambulate 300 ft with RW and CGA to elevator with RW  Seated: Heel toe raises on half foam roller 15x Adduction ball squeeze 15x  Hamstring isometric press into dynadisc 10x 5 second holds ; cues for breathing   Patient then completed another round of each above therex at same rep level and resistance.   Neuro Re-ed:  Standing with CGA next to support surface:  Airex pad: static stand 30 seconds x 2 trials, noticeable trembling of ankles/LE's with fatigue and challenge to maintain stability Airex pad: horizontal head turns 30 seconds scanning room 10x ; cueing for arc of motion  Airex pad: vertical head turns 30 seconds, cueing for arc of motion, noticeable sway with upward gaze increasing demand on ankle righting reaction musculature   Pt required occasional rest breaks due fatigue  PATIENT EDUCATION: Education details: goals, POC, HEP Person educated: Patient Education method: Explanation, Demonstration, Tactile cues, and Verbal cues Education comprehension: verbalized understanding, returned demonstration, verbal cues required, and tactile cues required  HOME EXERCISE PROGRAM: Access Code: [email or web address] URL: [email or web address] Date: 05/30/2022 Prepared by: Precious Bard  Exercises - Leg Extension  - 1 x daily - 7 x weekly - 2 sets - 10 reps - 5 hold - Seated March  - 1 x daily - 7 x weekly - 2 sets - 10 reps - 5 hold - Seated Ankle Circles  - 1 x daily - 7 x weekly - 2 sets - 10 reps - 5 hold  GOALS: Goals reviewed with patient? Yes  SHORT TERM GOALS: Target date: 06/27/2022    Patient will be independent in home exercise program to improve strength/mobility for better functional independence with ADLs. Baseline: 2/29: HEP given 4/24: completing 3-4  times per week with no issues  Goal status: MET   LONG TERM GOALS: Target date: 08/22/2022    Patient will increase FOTO score to equal to or greater than  54%   to demonstrate statistically significant improvement in mobility and quality of life.  Baseline: 2/29: 44% 4/24:55% Goal status: MET  2.  Patient (< 69 years old) will complete five times sit to stand test in < 10 seconds indicating an increased LE strength and improved balance. Baseline: 2/29: 41 seconds with significant UE support 07/24/22:19.38 R UE support only  Goal status: IN PROGRESS  3.  Patient will increase Berg Balance score by > 8 points (44/56) to demonstrate decreased fall risk during functional activities. Baseline: 2/29: 14/56 4/24: 36/56 Goal status: Initially met, altered to continue to monitor and decrease fall risk   4.  Patient will increase 10 meter walk test to >1.12m/s as to improve gait speed for better community ambulation and to reduce fall risk. Baseline: 2/29: 21 seconds increased knee pain 4/24:20 sec  Goal status: NOT MET    ASSESSMENT:  CLINICAL IMPRESSION: Patient requires intermittent rest breaks throughout session due to knee pain. Patient eager to progress his mobility however does have persistent bilateral knee pain today. Cueing for reducing arc of motion for LAQ required today. Pt will continue to benefit from skilled physical therapy intervention to address impairments, improve QOL, and attain therapy goals.     OBJECTIVE IMPAIRMENTS: Abnormal gait, cardiopulmonary status limiting activity, decreased activity tolerance, decreased balance, decreased coordination, decreased endurance, decreased mobility, difficulty walking, decreased strength, increased edema, impaired perceived functional ability, impaired flexibility, impaired sensation, impaired UE functional use, impaired vision/preception, improper body mechanics, postural dysfunction, obesity, and pain.   ACTIVITY LIMITATIONS:  carrying, lifting, bending, sitting, standing, squatting, stairs, transfers, bed mobility, bathing, toileting, dressing, reach over head, hygiene/grooming, locomotion level, and caring for others  PARTICIPATION LIMITATIONS: meal prep, cleaning, laundry, personal finances, driving, shopping, community activity, occupation, yard work, school, and church  PERSONAL FACTORS: Age, Behavior pattern, Education, Financial risk analyst, Past/current experiences, Profession, Social background, Time since onset of injury/illness/exacerbation, Transportation, and 3+ comorbidities: CVA (2019, 2023), obesity, Type II DM, HTN, HLD, lymphedema of BLE, Stage IIIa CKD, anemia, depression, hernia,arthritis, CHF  are also affecting patient's functional outcome.   REHAB POTENTIAL: Good  CLINICAL DECISION MAKING: Evolving/moderate complexity  EVALUATION COMPLEXITY: Moderate  PLAN:  PT FREQUENCY: 2x/week  PT DURATION: 12 weeks  PLANNED INTERVENTIONS: Therapeutic exercises, Therapeutic activity, Neuromuscular re-education, Balance training, Gait training, Patient/Family education, Self Care, Joint mobilization, Joint manipulation, Stair training, Vestibular training, Canalith repositioning, Visual/preceptual remediation/compensation, Orthotic/Fit  training, DME instructions, Cognitive remediation, Electrical stimulation, Spinal mobilization, Cryotherapy, Manual lymph drainage, Compression bandaging, Taping, Vasopneumatic device, Traction, Ultrasound, Ionotophoresis 4mg /ml Dexamethasone, Manual therapy, and Re-evaluation  PLAN FOR NEXT SESSION: balance, strength, ambulatory endurance training    Precious Bard, PT 08/14/2022, 5:43 PM

## 2022-08-14 NOTE — Therapy (Unsigned)
Occupational Therapy Neuro Treatment Note  Patient Name: Jeremiah Vance MRN: [id number] DOB:04/25/1970, 52 y.o., male Today's Date: 08/14/2022  PCP: Jeremiah Lat, PA-C REFERRING PROVIDER: Debera Lat, PA-C   OT End of Session - 08/14/22 1616     Visit Number 12    Number of Visits 24    Date for OT Re-Evaluation 10/21/22    Progress Note Due on Visit 10    OT Start Time 1600    OT Stop Time 1645    OT Time Calculation (min) 45 min    Equipment Utilized During Treatment RW    Activity Tolerance Patient tolerated treatment well    Behavior During Therapy Jeremiah Vance for tasks assessed/performed            Past Medical History:  Diagnosis Date   Acute ischemic stroke (HCC) 09/06/2017   Acute renal failure superimposed on stage 3a chronic kidney disease (HCC) 07/08/2019   Acute right-sided weakness    Allergies    Anasarca    Anemia 07/10/2017   Arthritis    CHF (congestive heart failure) (HCC)    "coinsided w/kidney problems I was having 06/2017"   Chicken pox    CKD (chronic kidney disease) stage 3, GFR 30-59 ml/min (HCC) 06/2017   Depression    Elevated troponin 07/08/2019   High cholesterol    History of cardiomyopathy    LVEF 40 to 45% in April 2019 - subsequently normalized   Hyperbilirubinemia 07/10/2017   Hypertension    Ischemic stroke (HCC)    Small left internal capsule infarct due to lacunar disease   Morbid obesity (HCC)    Normocytic anemia 12/16/2017   Recurrent incisional hernia with incarceration s/p repair 10/22/2017 10/21/2017   Stroke (HCC) 08/2017   "right sided weakness since; getting stronger though" (10/22/2017)   Type 2 diabetes mellitus (HCC)    Past Surgical History:  Procedure Laterality Date   ABDOMINAL HERNIA REPAIR  2008; 10/22/2017   "scope; OPEN REPAIR INCARCERATED VENTRAL HERNIA   HERNIA REPAIR     KNEE ARTHROSCOPY Right 1989   VENTRAL HERNIA REPAIR N/A 10/22/2017   Procedure: OPEN REPAIR INCARCERATED VENTRAL HERNIA;  Surgeon: Jeremiah Fellows, MD;  Location: [facility] OR;  Service: General;  Laterality: N/A;   Patient Active Problem List   Diagnosis Date Noted   Anxiety and depression 07/26/2022   Dysarthria 05/20/2022   Need for diphtheria-tetanus-pertussis (Tdap) vaccine 03/20/2022   Type 2 diabetes mellitus without complication, with long-term current use of insulin (HCC) 01/16/2022   Dyslipidemia 01/16/2022   Chronic diastolic CHF (congestive heart failure) (HCC) 01/16/2022   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    Intracranial hemorrhage (HCC)    HFrEF (heart failure with reduced ejection fraction) (HCC)    Obesity, Class III, BMI 40-49.9 (morbid obesity) (HCC) 05/22/2020   Other cirrhosis of liver (HCC)    Ventral hernia without obstruction or gangrene    Shortness of breath    Neck pain    Prostate cancer screening 01/21/2020   Lymphedema of both lower extremities 10/17/2019   History of stroke 10/17/2019   CKD (chronic kidney disease) stage 3, GFR 30-59 ml/min (HCC) 08/31/2019   Swelling of limb 08/31/2019   DM (diabetes mellitus), type 2 with complications (HCC) 07/08/2019   Vitreous floaters of both eyes 06/03/2018   Moderate obstructive sleep apnea 02/11/2018   Chronic fatigue 12/16/2017   Essential hypertension 12/16/2017   History of small bowel obstruction 10/24/2017   Adjustment disorder with  depressed mood 09/30/2017   Stroke (HCC) 09/14/2017   Hyperlipidemia    Gout    Elevated LFTs    Heart failure with preserved ejection fraction (HCC)    Morbid obesity (HCC)    Hypoalbuminemia 07/11/2017   Type 2 diabetes mellitus with hyperglycemia (HCC) 07/10/2017   ONSET DATE: Aug 2023  REFERRING DIAG: L sided weakness  THERAPY DIAG:  Muscle weakness (generalized)  Other lack of coordination  Rationale for Evaluation and Treatment: Rehabilitation  SUBJECTIVE:  SUBJECTIVE STATEMENT: Pt arrived with walker today.  Acknowledged feeling more stable with walker.    PERTINENT HISTORY: Pt reports 2-3 CVAs in Aug of 2023, but a total of 5 CVAs over the course of 4 years.  After the strokes in Aug, pt participated in [facility] therapy for 2-3 months.  Pt reports that he had been able to return to work up until 05/20/22.  Pt reported that his work felt like he was exhibiting stroke symptoms, so he went to the ED.  Per chart from ED visit on 05/20/22:     Clinical Course as of 05/20/22 1559  Mon May 20, 2022  1505 Prior CVA with soft blood pressures - reactivation of old CVA.  CTA negative. Code stroke and if MRI negative then will dc home.  [SM]     Clinical Course User Index [SM] Corena Herter, MD  MRI negative.  Patient states that he is feeling better.  Blood pressure improved while in the emergency department.  Last blood pressure 130 systolic.  Patient discharged home in stable condition with discussion to follow-up as an outpatient and return precautions given.   Corena Herter, MD 05/20/22 1733     PRECAUTIONS: fall  WEIGHT BEARING RESTRICTIONS: No  PAIN:  Are you having pain? Yes: NPRS scale: 5-6 L shoulder, bilat knees 7/10 Pain location: L shoulder and bilat knees Pain description: achy Aggravating factors: cold weather, walking, stairs Relieving factors: rest, elevation  FALLS: Has patient fallen in last 6 months? Yes. 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  PATIENT GOALS: To get the L side stronger and return to work.   OBJECTIVE:   HAND DOMINANCE: Right  ADLs: Overall ADLs: modified  indep with primarily use of the R arm. Transfers/ambulation related to ADLs: uses the walker outside, indoors no walker Eating: sister cuts food  Grooming: indep UB Dressing: extra time to manage clothing fasteners LB Dressing: slip on shoes; tucks laces into shoes Toileting: indep Bathing: modified indep Tub Shower transfers: walk in shower Equipment: see above  IADLs: Shopping: pt can accompany sister and pt pushes the cart Light housekeeping: shared task between pt and sister  Meal Prep: some difficulty opening new packages/containers but can manage simple light meal prep/microwavable meals  Community mobility: able to drive, limited ambulator d/t obesity, arthritic knees, and L sided weakness Medication management: sister sets up pills into weekly pill organizer  Financial management: pt manages  Handwriting: No issues (pt is R hand dominant)  POSTURE COMMENTS:  rounded shoulders  ACTIVITY TOLERANCE: Activity  tolerance: fair; limited community ambulator  FUNCTIONAL OUTCOME MEASURES: FOTO: 52; predicted 55  UPPER EXTREMITY ROM:    Active ROM Right Eval WNL Left eval Left 07/29/2022  Shoulder flexion  98 (limited by pain) 121  Shoulder abduction  102 110  Shoulder adduction     Shoulder extension     Shoulder internal rotation     Shoulder external rotation     Elbow flexion     Elbow extension     Wrist flexion     Wrist extension     Wrist ulnar deviation     Wrist radial deviation     Wrist pronation     Wrist supination     (Blank rows = not tested)  UPPER EXTREMITY MMT:     MMT Right eval Left eval Left  07/29/2022  Shoulder flexion 4+ NT (pain) 4-/5  Shoulder abduction 4 NT (pain) 3/5  Shoulder adduction     Shoulder extension     Shoulder internal rotation     Shoulder external rotation     Middle trapezius     Lower trapezius     Elbow flexion 5 4- 4/5  Elbow extension 5 4- 4/5  Wrist flexion 5 3+ 4-/5  Wrist extension 5 3+ 4-/5  Wrist  ulnar deviation     Wrist radial deviation     Wrist pronation     Wrist supination     (Blank rows = not tested)  HAND FUNCTION: Grip strength: Right: 71 lbs; Left: 27 lbs, Lateral pinch: Right: 16 lbs, Left: 6 lbs, and 3 point pinch: Right: 18 lbs, Left: 8 lbs  07/29/2022: Grip strength: Right: 71 lbs; Left: 40 lbs, Lateral pinch: Right: 16 lbs, Left: 7 lbs, and 3 point pinch: Right: 18 lbs, Left: 6 lbs  COORDINATION: Finger Nose Finger test: L sided good accuracy but slower  9 Hole Peg test: Right: 38 sec; Left: 46 sec Moderate difficulty opposing L hand digits to thumb   07/29/2022:  9-Hole Peg test: Right: 51 sec; Left: 51 sec. Pt. scores affecting by limited vision. 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, wears reading glasses all the time and reports increased blurred vision in the R eye since strokes in Aug of 2023  VISION ASSESSMENT: Tracking/Visual pursuits: impaired Saccades: impaired Visual Fields: pt limited in all visual fields of R eye (chart notes floaters both eyes)  Patient has difficulty with following activities due to following visual impairments: pt does not drive  PERCEPTION: WFL  PRAXIS: Impaired: Motor planning  TODAY'S TREATMENT:    Therapeutic Exercise: Facilitated hand strengthening with use of hand gripper set at moderate resistance with 2 red bands to complete 3 sets 10 reps beginning of session, another 2 sets 10 reps mid session for 5 sets total.  Facilitated pinch strengthening with use of therapy resistant clothespins to target lateral and 3 point pinch of L hand.  Pt managed all but black clips with a lateral  pinch, and all but black clips with a 3 point pinch, requiring min A to manage blue clips with a 3 point pinch.  Neuro re-ed: Facilitated L hand FMC/dexterity skills working to  pick up washers from a magnetic dish.  Pt practiced storing 3 washers in hand and discarding them 1 at a time onto a vertical dowel, then practiced removing 3 and discarding back into dish 1 at a time.  Practiced picking up small pegs and placing into small pegboard, removing 3-5 and storing in palm before discarding into container.    PATIENT EDUCATION: Education details: LUE strengthening and coordination training Person educated: Patient Education method: Explanation and Verbal cues Education comprehension: verbalized understanding  HOME EXERCISE PROGRAM: Theraputty, theraband  GOALS: Goals reviewed with patient? Yes  SHORT TERM GOALS: Target date:09/09/2022   Pt will be indep to perform HEP for increasing strength and coordination throughout the LUE. Baseline: 07/29/2022: Independent however required occasional visual cuing.  Eval: issued putty exercises Goal status: Partially met   LONG TERM GOALS: Target date: 10/21/2022   Pt will increase FOTO score to 55 or better to indicate clinically relevant improvement in self perceived use of L arm to perform daily tasks.  Baseline: 07/29/2022: 52 Eval: 52 Goal status: Ongoing   2.  Pt will increase L grip strength by 10 or more lbs in order to hold and carry ADL supplies in L hand. Baseline: 07/29/2022: Left grip: 40 lbs. Eval: L grip 27 lbs (R 71 lbs) Goal status: Ongoing  3.  Pt will increase LUE strength at the elbow and wrist in order to carry a heavy laundry basket with BUEs. Baseline: 07/29/2022:  L elbow flex 4, wrist flex 4- Eval: L elbow flex 4-, wrist flex 3+ Goal status: Ongoing  4.  Pt will increase L lateral pinch strength in order to ease ability to open small bottles and food packages.  Baseline: 07/29/2022:  L 7 lbs Eval: L 6 lbs (R 16  lbs) Goal status:Ongoing  5.  Pt will increase L FMC/dexterity skills to ease ability to manipulate small ADL supplies.  Baseline:07/29/2022: L 9 hole: 51 sec (R 51 sec) Pt. Is able to grasp larger pills easier. Eval: L 9 hole 48 sec (R 36 sec); extra time to manage clothing fasteners and to pick up pills Goal status: Ongoing   ASSESSMENT: CLINICAL IMPRESSION: Pt arrived with walker today.  Acknowledged feeling more stable with walker.  Continued focus on L hand strengthening and dexterity skills.  Pt continues to be challenged by translatory skills, moving small objects from palm to fingertips and repositioning items within fingertips.  Pt had a few dropped washers and pegs and requires extra time for repositioning items within fingertips.  L hand grip and pinch remains weak.  Pt reports that he has a difficult time opening up new jars in the kitchen.   Pt continues to work towards goals in plan of care into the next recertification period to maximize safety and independence in necessary daily tasks.    PERFORMANCE DEFICITS: in functional skills including ADLs, IADLs, coordination, dexterity, sensation, strength, pain, Fine motor control, Gross motor control, mobility, balance, body mechanics, endurance, decreased knowledge of use of DME, vision, and UE functional use, and psychosocial skills including coping strategies, environmental adaptation, habits, and routines and behaviors.   IMPAIRMENTS: are limiting patient from ADLs and IADLs.   CO-MORBIDITIES: has co-morbidities such as legally blind L eye, obesity, arthritis  that affects occupational performance. Patient will benefit from skilled OT to address above impairments and improve overall function.  MODIFICATION OR ASSISTANCE TO COMPLETE EVALUATION: No modification of tasks or assist necessary to complete an evaluation.  OT OCCUPATIONAL PROFILE AND HISTORY: Problem  focused assessment: Including review of records relating to presenting  problem.  CLINICAL DECISION MAKING: Moderate - several treatment options, min-mod task modification necessary  REHAB POTENTIAL: Good  EVALUATION COMPLEXITY: Moderate  PLAN:  OT FREQUENCY: 2x/week  OT DURATION: 12 weeks  PLANNED INTERVENTIONS: self care/ADL training, therapeutic exercise, therapeutic activity, neuromuscular re-education, manual therapy, passive range of motion, balance training, functional mobility training, moist heat, cryotherapy, contrast bath, patient/family education, energy conservation, coping strategies training, and DME and/or AE instructions  RECOMMENDED OTHER SERVICES: None at this time  CONSULTED AND AGREED WITH PLAN OF CARE: Patient  PLAN FOR NEXT SESSION: see above  Danelle Earthly, MS, OTR/L

## 2022-08-19 ENCOUNTER — Other Ambulatory Visit (HOSPITAL_COMMUNITY): Payer: Self-pay

## 2022-08-19 NOTE — Therapy (Signed)
OUTPATIENT PHYSICAL THERAPY NEURO TREATMENT/ RECERT   Patient Name: Jeremiah Vance MRN: [id number] DOB:[birth year], 52 y.o., male Today's Date: 08/20/2022   PCP: Debera Lat PA REFERRING PROVIDER: Debera Lat PA  END OF SESSION:  PT End of Session - 08/20/22 1246     Visit Number 14    Number of Visits 48    Date for PT Re-Evaluation 11/12/22    Progress Note Due on Visit 20    PT Start Time 1256    PT Stop Time 1339    PT Time Calculation (min) 43 min    Equipment Utilized During Treatment Gait belt    Activity Tolerance Patient tolerated treatment well;Patient limited by pain    Behavior During Therapy [facility] for tasks assessed/performed                       Past Medical History:  Diagnosis Date   Acute ischemic stroke (HCC) 09/06/2017   Acute renal failure superimposed on stage 3a chronic kidney disease (HCC) 07/08/2019   Acute right-sided weakness    Allergies    Anasarca    Anemia 07/10/2017   Arthritis    CHF (congestive heart failure) (HCC)    "coinsided w/kidney problems I was having 06/2017"   Chicken pox    CKD (chronic kidney disease) stage 3, GFR 30-59 ml/min (HCC) 06/2017   Depression    Elevated troponin 07/08/2019   High cholesterol    History of cardiomyopathy    LVEF 40 to 45% in April 2019 - subsequently normalized   Hyperbilirubinemia 07/10/2017   Hypertension    Ischemic stroke (HCC)    Small left internal capsule infarct due to lacunar disease   Morbid obesity (HCC)    Normocytic anemia 12/16/2017   Recurrent incisional hernia with incarceration s/p repair 10/22/2017 10/21/2017   Stroke (HCC) 08/2017   "right sided weakness since; getting stronger though" (10/22/2017)   Type 2 diabetes mellitus (HCC)    Past Surgical History:  Procedure Laterality Date   ABDOMINAL HERNIA REPAIR  2008; 10/22/2017   "scope; OPEN REPAIR INCARCERATED VENTRAL HERNIA   HERNIA REPAIR     KNEE ARTHROSCOPY Right 1989   VENTRAL HERNIA REPAIR N/A 10/22/2017    Procedure: OPEN REPAIR INCARCERATED VENTRAL HERNIA;  Surgeon: Glenna Fellows, MD;  Location: [facility] OR;  Service: General;  Laterality: N/A;   Patient Active Problem List   Diagnosis Date Noted   Anxiety and depression 07/26/2022   Dysarthria 05/20/2022   Need for diphtheria-tetanus-pertussis (Tdap) vaccine 03/20/2022   Type 2 diabetes mellitus without complication, with long-term current use of insulin (HCC) 01/16/2022   Dyslipidemia 01/16/2022   Chronic diastolic CHF (congestive heart failure) (HCC) 01/16/2022   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    Intracranial hemorrhage (HCC)    HFrEF (heart failure with reduced ejection fraction) (HCC)    Obesity, Class III, BMI 40-49.9 (morbid obesity) (HCC) 05/22/2020   Other cirrhosis of liver (HCC)    Ventral hernia without obstruction or gangrene    Shortness of breath    Neck pain    Prostate cancer screening 01/21/2020   Lymphedema of both lower extremities 10/17/2019   History of stroke 10/17/2019   CKD (chronic kidney disease) stage 3, GFR 30-59 ml/min (HCC) 08/31/2019   Swelling of limb 08/31/2019   DM (diabetes mellitus), type 2 with complications (HCC) 07/08/2019   Vitreous floaters of both eyes 06/03/2018   Moderate obstructive sleep apnea 02/11/2018  Chronic fatigue 12/16/2017   Essential hypertension 12/16/2017   History of small bowel obstruction 10/24/2017   Adjustment disorder with depressed mood 09/30/2017   Stroke (HCC) 09/14/2017   Hyperlipidemia    Gout    Elevated LFTs    Heart failure with preserved ejection fraction (HCC)    Morbid obesity (HCC)    Hypoalbuminemia 07/11/2017   Type 2 diabetes mellitus with hyperglycemia (HCC) 07/10/2017    ONSET DATE: 05/20/22  REFERRING DIAG: Left sided weakness   THERAPY DIAG:  Muscle weakness (generalized) - Plan: PT plan of care cert/re-cert  Unsteadiness on feet - Plan: PT plan of care cert/re-cert  Difficulty in walking,  not elsewhere classified - Plan: PT plan of care cert/re-cert  Rationale for Evaluation and Treatment: Rehabilitation  SUBJECTIVE:                                                                                                                                                                                             SUBJECTIVE STATEMENT: Patient reports he is improving from therapy, feels 75% to where he wants to be.    Pt accompanied by: self  PERTINENT HISTORY:   Patient presents for Left sided weakness. Patient presented to [facility] on 05/20/22 for L sided weakness and slurred speech.MRI negative.  PMH includes CVA (2019, 2023), obesity, Type II DM, HTN, HLD, lymphedema of BLE, Stage IIIa CKD, anemia, depression, hernia,arthritis, CHF. Patient had multiple strokes in  fall 2023 and received home health. 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 Patient has not worked the past two weeks due to another incident 2-3 weeks ago. Prior to stroke was independent, use a walker occasionally now.   PAIN:  Are you having pain? Yes: NPRS scale: 8/10 Pain location: R lower leg Pain description: aching Aggravating factors: Fall  Relieving factors: sitting down, keeping legs raised, hot shower  PRECAUTIONS: Fall  WEIGHT BEARING RESTRICTIONS: No  FALLS: Has patient fallen in last 6 months? Yes. Number of falls 1  LIVING ENVIRONMENT: Lives with: lives with their family Lives in: House/apartment Stairs: Yes: Internal: flight steps; on right going up Has following equipment at home: Dan Humphreys - 2 wheeled  PLOF: Independent  PATIENT GOALS: to get stronger  OBJECTIVE:   DIAGNOSTIC FINDINGS:   MRI: IMPRESSION: 1. No evidence of acute abnormality. 2. Prior hemorrhages, prior lacunar infarcts, and prior chronic microvascular ischemic disease which is advanced for patient age. Findings could be in part secondary to chronic hypertension.  CTA:   1. No emergent large vessel occlusion. 2. 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 3. Multifocal irregularity and moderate  narrowing of the right vertebral artery, mildly progressed.  COGNITION: Overall cognitive status:  short term memory issues s/p stroke.    SENSATION: Light touch: Impaired LLE  COORDINATION: LLE coordination impaired with delay and limited coordination.   EDEMA:  Bilateral lymphedema of LE's   POSTURE: rounded shoulders, forward head, and flexed trunk    LOWER EXTREMITY MMT:    MMT Right Eval Left Eval  Hip flexion 3 3  Hip extension    Hip abduction 4- 3+  Hip adduction 4- 3+  Hip internal rotation    Hip external rotation    Knee flexion 3+ 3  Knee extension 3+ 3  Ankle dorsiflexion 4- 3-  Ankle plantarflexion 4- 3+  Ankle inversion    Ankle eversion    (Blank rows = not tested)   TRANSFERS: Assistive device utilized: None  Sit to stand: CGA Stand to sit: CGA Chair to chair: CGA    GAIT: Gait pattern: step through pattern, decreased stride length, antalgic, wide BOS, poor foot clearance- Right, and poor foot clearance- Left Distance walked: 40 ft Assistive device utilized: None Level of assistance: CGA Comments: Patient is unsteady without AD, has antalgic gait pattern with R knee however has limited strength for weightbearing on LLE.   FUNCTIONAL TESTS:  5 times sit to stand: 41 seconds  10 meter walk test: 21 seconds increased knee pain 6/10 Berg Balance Scale: 14/56  PATIENT SURVEYS:  FOTO 44  TODAY'S TREATMENT:                                                                                                                              DATE: 08/20/22   Perform goals: see below for details    [facility] PT Assessment - 08/20/22 0001       Standardized Balance Assessment   Standardized Balance Assessment Berg Balance Test      Berg Balance Test   Sit to Stand Able to stand without using hands and stabilize independently    Standing Unsupported Able to stand  safely 2 minutes    Sitting with Back Unsupported but Feet Supported on Floor or Stool Able to sit safely and securely 2 minutes    Stand to Sit Controls descent by using hands    Transfers Able to transfer safely, definite need of hands    Standing Unsupported with Eyes Closed Able to stand 10 seconds with supervision    Standing Unsupported with Feet Together Able to place feet together independently and stand for 1 minute with supervision    From Standing, Reach Forward with Outstretched Arm Can reach forward >5 cm safely (2")    From Standing Position, Pick up Object from Floor Able to pick up shoe, needs supervision    From Standing Position, Turn to Look Behind Over each Shoulder Looks behind one side only/other side shows less weight shift    Turn 360 Degrees Able to turn 360 degrees safely but slowly  Standing Unsupported, Alternately Place Feet on Step/Stool Able to complete 4 steps without aid or supervision    Standing Unsupported, One Foot in Front Able to take small step independently and hold 30 seconds    Standing on One Leg Tries to lift leg/unable to hold 3 seconds but remains standing independently    Total Score 39           BERG requires multiple seated breaks, dizziness with head turns.     TherEx:  Seated with # 2.5 ankle weights ; 2 sets  -Seated marches with upright posture, back away from back of chair for abdominal/trunk activation/stabilization, 10x each LE -Seated LAQ with 3 second holds, 10x each LE, cueing for muscle activation and sequencing for neutral alignment -Seated IR/ER with cueing for stabilizing knee placement with lateral foot movement for optimal muscle recruitment, 10x each LE -heel raise 20x      Seated:  Adduction ball squeeze 15x  Hamstring isometric press into dynadisc 15x 5 second holds ; cues for breathing       Pt required occasional rest breaks due fatigue    PATIENT EDUCATION: Education details: goals, POC,  HEP Person educated: Patient Education method: Explanation, Demonstration, Tactile cues, and Verbal cues Education comprehension: verbalized understanding, returned demonstration, verbal cues required, and tactile cues required  HOME EXERCISE PROGRAM: Access Code: [email or web address] URL: [email or web address] Date: 05/30/2022 Prepared by: Precious Bard  Exercises - Leg Extension  - 1 x daily - 7 x weekly - 2 sets - 10 reps - 5 hold - Seated March  - 1 x daily - 7 x weekly - 2 sets - 10 reps - 5 hold - Seated Ankle Circles  - 1 x daily - 7 x weekly - 2 sets - 10 reps - 5 hold  GOALS: Goals reviewed with patient? Yes  SHORT TERM GOALS: Target date: 06/27/2022    Patient will be independent in home exercise program to improve strength/mobility for better functional independence with ADLs. Baseline: 2/29: HEP given 4/24: completing 3-4 times per week with no issues  Goal status: MET   LONG TERM GOALS: Target date: 11/12/2022      Patient will increase FOTO score to equal to or greater than  54%   to demonstrate statistically significant improvement in mobility and quality of life.  Baseline: 2/29: 44% 4/24:55% Goal status: MET  2.  Patient (< 4 years old) will complete five times sit to stand test in < 10 seconds indicating an increased LE strength and improved balance. Baseline: 2/29: 41 seconds with significant UE support 07/24/22:19.38 R UE support only 5/21: 21 seconds without UE support Goal status: IN PROGRESS  3.  Patient will increase Berg Balance score by > 8 points (44/56) to demonstrate decreased fall risk during functional activities. Baseline: 2/29: 14/56 4/24: 36/56 5/21: 39 Goal status: Partially Met  4.  Patient will increase 10 meter walk test to >1.4m/s as to improve gait speed for better community ambulation and to reduce fall risk. Baseline: 2/29: 21 seconds increased knee pain 4/24:20 sec 5/21: 15 seconds  Goal status: Partially Met      ASSESSMENT:  CLINICAL IMPRESSION: Patient is able to perform sit to stands for first time without UE support. His BERG is improving with new goal progression. His gait speed is improving demonstrating carryover between sessions.  Continued focus on LE strengthening for functional ambulation and stability will be focus for next round of therapy. Patient does have 7/10 knee pain by end of  goals/session this treatment requiring rest breaks and seated pain reduction strategy.    Pt will continue to benefit from skilled physical therapy intervention to address impairments, improve QOL, and attain therapy goals.     OBJECTIVE IMPAIRMENTS: Abnormal gait, cardiopulmonary status limiting activity, decreased activity tolerance, decreased balance, decreased coordination, decreased endurance, decreased mobility, difficulty walking, decreased strength, increased edema, impaired perceived functional ability, impaired flexibility, impaired sensation, impaired UE functional use, impaired vision/preception, improper body mechanics, postural dysfunction, obesity, and pain.   ACTIVITY LIMITATIONS: carrying, lifting, bending, sitting, standing, squatting, stairs, transfers, bed mobility, bathing, toileting, dressing, reach over head, hygiene/grooming, locomotion level, and caring for others  PARTICIPATION LIMITATIONS: meal prep, cleaning, laundry, personal finances, driving, shopping, community activity, occupation, yard work, school, and church  PERSONAL FACTORS: Age, Behavior pattern, Education, Financial risk analyst, Past/current experiences, Profession, Social background, Time since onset of injury/illness/exacerbation, Transportation, and 3+ comorbidities: CVA (2019, 2023), obesity, Type II DM, HTN, HLD, lymphedema of BLE, Stage IIIa CKD, anemia, depression, hernia,arthritis, CHF  are also affecting patient's functional outcome.   REHAB POTENTIAL: Good  CLINICAL DECISION MAKING: Evolving/moderate  complexity  EVALUATION COMPLEXITY: Moderate  PLAN:  PT FREQUENCY: 2x/week  PT DURATION: 12 weeks  PLANNED INTERVENTIONS: Therapeutic exercises, Therapeutic activity, Neuromuscular re-education, Balance training, Gait training, Patient/Family education, Self Care, Joint mobilization, Joint manipulation, Stair training, Vestibular training, Canalith repositioning, Visual/preceptual remediation/compensation, Orthotic/Fit training, DME instructions, Cognitive remediation, Electrical stimulation, Spinal mobilization, Cryotherapy, Manual lymph drainage, Compression bandaging, Taping, Vasopneumatic device, Traction, Ultrasound, Ionotophoresis 4mg /ml Dexamethasone, Manual therapy, and Re-evaluation  PLAN FOR NEXT SESSION: balance, strength, ambulatory endurance training    Precious Bard, PT 08/20/2022, 1:44 PM

## 2022-08-20 ENCOUNTER — Ambulatory Visit: Payer: Medicaid Other

## 2022-08-20 DIAGNOSIS — R262 Difficulty in walking, not elsewhere classified: Secondary | ICD-10-CM | POA: Diagnosis not present

## 2022-08-20 DIAGNOSIS — M6281 Muscle weakness (generalized): Secondary | ICD-10-CM | POA: Diagnosis not present

## 2022-08-20 DIAGNOSIS — R278 Other lack of coordination: Secondary | ICD-10-CM | POA: Diagnosis not present

## 2022-08-20 DIAGNOSIS — R2681 Unsteadiness on feet: Secondary | ICD-10-CM

## 2022-08-23 ENCOUNTER — Ambulatory Visit: Payer: Medicaid Other

## 2022-08-27 ENCOUNTER — Other Ambulatory Visit: Payer: Self-pay

## 2022-08-27 ENCOUNTER — Telehealth: Payer: Self-pay | Admitting: Physician Assistant

## 2022-08-27 DIAGNOSIS — E1165 Type 2 diabetes mellitus with hyperglycemia: Secondary | ICD-10-CM

## 2022-08-27 DIAGNOSIS — F419 Anxiety disorder, unspecified: Secondary | ICD-10-CM

## 2022-08-27 DIAGNOSIS — F4321 Adjustment disorder with depressed mood: Secondary | ICD-10-CM

## 2022-08-27 MED ORDER — HYDRALAZINE HCL 50 MG PO TABS: 50.0000 mg | ORAL_TABLET | Freq: Two times a day (BID) | ORAL | 0 refills | Status: DC

## 2022-08-27 MED ORDER — DAPAGLIFLOZIN PROPANEDIOL 10 MG PO TABS: ORAL_TABLET | ORAL | 0 refills | Status: DC

## 2022-08-27 NOTE — Telephone Encounter (Signed)
please contact patient to r/s NS. Thanks!   last seen 02/11/2022--Disposition: Follow up in 3 month(s) with MD/APP   NS 05/14/22

## 2022-08-27 NOTE — Telephone Encounter (Signed)
There should be refills on both of these

## 2022-08-27 NOTE — Telephone Encounter (Signed)
Requested Prescriptions   Signed Prescriptions Disp Refills   hydrALAZINE (APRESOLINE) 50 MG tablet 180 tablet 0    Sig: Take 1 tablet (50 mg total) by mouth in the morning and at bedtime. please contact office to schedule follow up prior to further refills.    Authorizing Provider: Debbe Odea    Ordering User: Guerry Minors   dapagliflozin propanediol (FARXIGA) 10 MG TABS tablet 180 tablet 0    Sig: Take 1 tablet (10 mg) by mouth once daily/please contact office to schedule follow up prior to further refills.    Authorizing Provider: Debbe Odea    Ordering User: Guerry Minors

## 2022-08-27 NOTE — Telephone Encounter (Addendum)
[facility] is requesting prescription refill busPIRone (BUSPAR) 7.5 MG tablet  & Freestyle Libre 2 Sensor Kit  Please advise

## 2022-08-28 ENCOUNTER — Other Ambulatory Visit (HOSPITAL_COMMUNITY): Payer: Self-pay

## 2022-08-28 ENCOUNTER — Ambulatory Visit: Payer: Medicaid Other

## 2022-08-29 NOTE — Telephone Encounter (Signed)
Left voicemail to schedule appointment 

## 2022-09-02 ENCOUNTER — Ambulatory Visit: Payer: Medicaid Other | Attending: Physician Assistant

## 2022-09-02 ENCOUNTER — Ambulatory Visit: Payer: Medicaid Other

## 2022-09-02 DIAGNOSIS — R2681 Unsteadiness on feet: Secondary | ICD-10-CM | POA: Diagnosis not present

## 2022-09-02 DIAGNOSIS — M6281 Muscle weakness (generalized): Secondary | ICD-10-CM

## 2022-09-02 DIAGNOSIS — R278 Other lack of coordination: Secondary | ICD-10-CM | POA: Diagnosis not present

## 2022-09-02 DIAGNOSIS — R262 Difficulty in walking, not elsewhere classified: Secondary | ICD-10-CM | POA: Insufficient documentation

## 2022-09-02 NOTE — Therapy (Unsigned)
Occupational Therapy Neuro Treatment Note  Patient Name: Jeremiah Vance MRN: [id number] DOB:1971-03-10, 52 y.o., male Today's Date: 09/02/2022  PCP: Debera Lat, PA-C REFERRING PROVIDER: Debera Lat, PA-C   OT End of Session - 09/02/22 1642     Visit Number 13    Number of Visits 24    Date for OT Re-Evaluation 10/21/22    Progress Note Due on Visit 10    OT Start Time 1642    OT Stop Time 1727    OT Time Calculation (min) 45 min    Equipment Utilized During Treatment RW    Activity Tolerance Patient tolerated treatment well    Behavior During Therapy [facility] for tasks assessed/performed            Past Medical History:  Diagnosis Date   Acute ischemic stroke (HCC) 09/06/2017   Acute renal failure superimposed on stage 3a chronic kidney disease (HCC) 07/08/2019   Acute right-sided weakness    Allergies    Anasarca    Anemia 07/10/2017   Arthritis    CHF (congestive heart failure) (HCC)    "coinsided w/kidney problems I was having 06/2017"   Chicken pox    CKD (chronic kidney disease) stage 3, GFR 30-59 ml/min (HCC) 06/2017   Depression    Elevated troponin 07/08/2019   High cholesterol    History of cardiomyopathy    LVEF 40 to 45% in April 2019 - subsequently normalized   Hyperbilirubinemia 07/10/2017   Hypertension    Ischemic stroke (HCC)    Small left internal capsule infarct due to lacunar disease   Morbid obesity (HCC)    Normocytic anemia 12/16/2017   Recurrent incisional hernia with incarceration s/p repair 10/22/2017 10/21/2017   Stroke (HCC) 08/2017   "right sided weakness since; getting stronger though" (10/22/2017)   Type 2 diabetes mellitus (HCC)    Past Surgical History:  Procedure Laterality Date   ABDOMINAL HERNIA REPAIR  2008; 10/22/2017   "scope; OPEN REPAIR INCARCERATED VENTRAL HERNIA   HERNIA REPAIR     KNEE ARTHROSCOPY Right 1989   VENTRAL HERNIA REPAIR N/A 10/22/2017   Procedure: OPEN REPAIR INCARCERATED VENTRAL HERNIA;  Surgeon: Glenna Fellows, MD;  Location: [facility] OR;  Service: General;  Laterality: N/A;   Patient Active Problem List   Diagnosis Date Noted   Anxiety and depression 07/26/2022   Dysarthria 05/20/2022   Need for diphtheria-tetanus-pertussis (Tdap) vaccine 03/20/2022   Type 2 diabetes mellitus without complication, with long-term current use of insulin (HCC) 01/16/2022   Dyslipidemia 01/16/2022   Chronic diastolic CHF (congestive heart failure) (HCC) 01/16/2022   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    Intracranial hemorrhage (HCC)    HFrEF (heart failure with reduced ejection fraction) (HCC)    Obesity, Class III, BMI 40-49.9 (morbid obesity) (HCC) 05/22/2020   Other cirrhosis of liver (HCC)    Ventral hernia without obstruction or gangrene    Shortness of breath    Neck pain    Prostate cancer screening 01/21/2020   Lymphedema of both lower extremities 10/17/2019   History of stroke 10/17/2019   CKD (chronic kidney disease) stage 3, GFR 30-59 ml/min (HCC) 08/31/2019   Swelling of limb 08/31/2019   DM (diabetes mellitus), type 2 with complications (HCC) 07/08/2019   Vitreous floaters of both eyes 06/03/2018   Moderate obstructive sleep apnea 02/11/2018   Chronic fatigue 12/16/2017   Essential hypertension 12/16/2017   History of small bowel obstruction 10/24/2017   Adjustment disorder with  depressed mood 09/30/2017   Stroke (HCC) 09/14/2017   Hyperlipidemia    Gout    Elevated LFTs    Heart failure with preserved ejection fraction (HCC)    Morbid obesity (HCC)    Hypoalbuminemia 07/11/2017   Type 2 diabetes mellitus with hyperglycemia (HCC) 07/10/2017   ONSET DATE: Aug 2023  REFERRING DIAG: L sided weakness  THERAPY DIAG:  Muscle weakness (generalized)  Other lack of coordination  Rationale for Evaluation and Treatment: Rehabilitation  SUBJECTIVE:  SUBJECTIVE STATEMENT: Pt reports doing well today and states he missed last week as he was visiting  his newborn niece in the hospital.  PERTINENT HISTORY: Pt reports 2-3 CVAs in Aug of 2023, but a total of 5 CVAs over the course of 4 years.  After the strokes in Aug, pt participated in [facility] for 2-3 months.  Pt reports that he had been able to return to work up until 05/20/22.  Pt reported that his work felt like he was exhibiting stroke symptoms, so he went to the ED.  Per chart from ED visit on 05/20/22:     Clinical Course as of 05/20/22 1559  Mon May 20, 2022  1505 Prior CVA with soft blood pressures - reactivation of old CVA.  CTA negative. Code stroke and if MRI negative then will dc home.  [SM]     Clinical Course User Index [SM] Corena Herter, MD  MRI negative.  Patient states that he is feeling better.  Blood pressure improved while in the emergency department.  Last blood pressure 130 systolic.  Patient discharged home in stable condition with discussion to follow-up as an outpatient and return precautions given.   Corena Herter, MD 05/20/22 1733     PRECAUTIONS: fall  WEIGHT BEARING RESTRICTIONS: No  PAIN:  Are you having pain? Yes: NPRS scale: 6-7/10 Pain location: bilat knees Pain description: achy Aggravating factors: cold weather, walking, stairs Relieving factors: rest  FALLS: Has patient fallen in last 6 months? Yes. 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  PATIENT GOALS: To get the L side stronger and return to work.   OBJECTIVE:   HAND DOMINANCE: Right  ADLs: Overall ADLs: modified indep with  primarily use of the R arm. Transfers/ambulation related to ADLs: uses the walker outside, indoors no walker Eating: sister cuts food  Grooming: indep UB Dressing: extra time to manage clothing fasteners LB Dressing: slip on shoes; tucks laces into shoes Toileting: indep Bathing: modified indep Tub Shower transfers: walk in shower Equipment: see above  IADLs: Shopping: pt can accompany sister and pt pushes the cart Light housekeeping: shared task between pt and sister  Meal Prep: some difficulty opening new packages/containers but can manage simple light meal prep/microwavable meals  Community mobility: able to drive, limited ambulator d/t obesity, arthritic knees, and L sided weakness Medication management: sister sets up pills into weekly pill organizer  Financial management: pt manages  Handwriting: No issues (pt is R hand dominant)  POSTURE COMMENTS:  rounded shoulders  ACTIVITY TOLERANCE: Activity tolerance:  fair; limited community ambulator  FUNCTIONAL OUTCOME MEASURES: FOTO: 52; predicted 55  UPPER EXTREMITY ROM:    Active ROM Right Eval WNL Left eval Left 07/29/2022  Shoulder flexion  98 (limited by pain) 121  Shoulder abduction  102 110  Shoulder adduction     Shoulder extension     Shoulder internal rotation     Shoulder external rotation     Elbow flexion     Elbow extension     Wrist flexion     Wrist extension     Wrist ulnar deviation     Wrist radial deviation     Wrist pronation     Wrist supination     (Blank rows = not tested)  UPPER EXTREMITY MMT:     MMT Right eval Left eval Left  07/29/2022  Shoulder flexion 4+ NT (pain) 4-/5  Shoulder abduction 4 NT (pain) 3/5  Shoulder adduction     Shoulder extension     Shoulder internal rotation     Shoulder external rotation     Middle trapezius     Lower trapezius     Elbow flexion 5 4- 4/5  Elbow extension 5 4- 4/5  Wrist flexion 5 3+ 4-/5  Wrist extension 5 3+ 4-/5  Wrist ulnar  deviation     Wrist radial deviation     Wrist pronation     Wrist supination     (Blank rows = not tested)  HAND FUNCTION: Grip strength: Right: 71 lbs; Left: 27 lbs, Lateral pinch: Right: 16 lbs, Left: 6 lbs, and 3 point pinch: Right: 18 lbs, Left: 8 lbs  07/29/2022: Grip strength: Right: 71 lbs; Left: 40 lbs, Lateral pinch: Right: 16 lbs, Left: 7 lbs, and 3 point pinch: Right: 18 lbs, Left: 6 lbs  COORDINATION: Finger Nose Finger test: L sided good accuracy but slower  9 Hole Peg test: Right: 38 sec; Left: 46 sec Moderate difficulty opposing L hand digits to thumb   07/29/2022:  9-Hole Peg test: Right: 51 sec; Left: 51 sec. Pt. scores affecting by limited vision. 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, wears reading glasses all the time and reports increased blurred vision in the R eye since strokes in Aug of 2023  VISION ASSESSMENT: Tracking/Visual pursuits: impaired Saccades: impaired Visual Fields: pt limited in all visual fields of R eye (chart notes floaters both eyes)  Patient has difficulty with following activities due to following visual impairments: pt does not drive  PERCEPTION: WFL  PRAXIS: Impaired: Motor planning  TODAY'S TREATMENT:    Therapeutic Exercise: Jumbo pegs, putty tools yellow putty  Therapeutic Activity: Pop beads  PATIENT EDUCATION: Education details: LUE strengthening and coordination training Person educated: Patient Education method: Explanation and Verbal cues Education comprehension: verbalized understanding  HOME EXERCISE PROGRAM: Theraputty, theraband  GOALS: Goals reviewed with patient?  Yes  SHORT TERM GOALS: Target date:09/09/2022   Pt will be indep to perform HEP for increasing strength and coordination throughout the LUE. Baseline: 07/29/2022: Independent however required occasional visual cuing.  Eval: issued putty exercises Goal status: Partially met   LONG TERM GOALS: Target date: 10/21/2022   Pt will increase  FOTO score to 55 or better to indicate clinically relevant improvement in self perceived use of L arm to perform daily tasks.  Baseline: 07/29/2022: 52 Eval: 52 Goal status: Ongoing   2.  Pt will increase L grip strength by 10 or more lbs in order to hold and carry ADL supplies in L hand. Baseline: 07/29/2022: Left grip: 40 lbs. Eval: L grip 27 lbs (R 71 lbs) Goal status: Ongoing  3.  Pt will increase LUE strength at the elbow and wrist in order to carry a heavy laundry basket with BUEs. Baseline: 07/29/2022:  L elbow flex 4, wrist flex 4- Eval: L elbow flex 4-, wrist flex 3+ Goal status: Ongoing  4.  Pt will increase L lateral pinch strength in order to ease ability to open small bottles and food packages.  Baseline: 07/29/2022:  L 7 lbs Eval: L 6 lbs (R 16 lbs) Goal status:Ongoing  5.  Pt will increase L FMC/dexterity skills to ease ability to manipulate small ADL supplies.  Baseline:07/29/2022: L 9 hole: 51 sec (R 51 sec) Pt. Is able to grasp larger pills easier. Eval: L 9 hole 48 sec (R 36 sec); extra time to manage clothing fasteners and to pick up pills Goal status: Ongoing   ASSESSMENT: CLINICAL IMPRESSION: Pt arrived with walker today.  Acknowledged feeling more stable with walker.  Continued focus on L hand strengthening and dexterity skills.  Pt continues to be challenged by translatory skills, moving small objects from palm to fingertips and repositioning items within fingertips.  Pt had a few dropped washers and pegs and requires extra time for repositioning items within fingertips.  L hand grip and pinch remains weak.  Pt reports that he has a  difficult time opening up new jars in the kitchen.   Pt continues to work towards goals in plan of care into the next recertification period to maximize safety and independence in necessary daily tasks.    PERFORMANCE DEFICITS: in functional skills including ADLs, IADLs, coordination, dexterity, sensation, strength, pain, Fine motor control, Gross motor control, mobility, balance, body mechanics, endurance, decreased knowledge of use of DME, vision, and UE functional use, and psychosocial skills including coping strategies, environmental adaptation, habits, and routines and behaviors.   IMPAIRMENTS: are limiting patient from ADLs and IADLs.   CO-MORBIDITIES: has co-morbidities such as legally blind L eye, obesity, arthritis  that affects occupational performance. Patient will benefit from skilled OT to address above impairments and improve overall function.  MODIFICATION OR ASSISTANCE TO COMPLETE EVALUATION: No modification of tasks or assist necessary to complete an evaluation.  OT OCCUPATIONAL PROFILE AND HISTORY: Problem focused assessment: Including review of records relating to presenting problem.  CLINICAL DECISION MAKING: Moderate - several treatment options, min-mod task modification necessary  REHAB POTENTIAL: Good  EVALUATION COMPLEXITY: Moderate  PLAN:  OT FREQUENCY: 2x/week  OT DURATION: 12 weeks  PLANNED INTERVENTIONS: self care/ADL training, therapeutic exercise, therapeutic activity, neuromuscular re-education, manual therapy, passive range of motion, balance training, functional mobility training, moist heat, cryotherapy, contrast bath, patient/family education, energy conservation, coping strategies training, and DME and/or AE instructions  RECOMMENDED OTHER SERVICES: None at this time  CONSULTED AND AGREED WITH PLAN OF CARE: Patient  PLAN FOR NEXT SESSION: see above  Danelle Earthly, MS, OTR/L

## 2022-09-02 NOTE — Therapy (Signed)
OUTPATIENT PHYSICAL THERAPY NEURO TREATMENT   Patient Name: Jeremiah Vance MRN: [id number] DOB:16-Apr-1970, 52 y.o., male Today's Date: 09/02/2022   PCP: Debera Lat PA REFERRING PROVIDER: Debera Lat PA  END OF SESSION:  PT End of Session - 09/02/22 1543     Visit Number 15    Number of Visits 48    Date for PT Re-Evaluation 11/12/22    Progress Note Due on Visit 20    PT Start Time 1559    PT Stop Time 1642    PT Time Calculation (min) 43 min    Equipment Utilized During Treatment Gait belt    Activity Tolerance Patient tolerated treatment well;Patient limited by pain    Behavior During Therapy [facility] for tasks assessed/performed                        Past Medical History:  Diagnosis Date   Acute ischemic stroke (HCC) 09/06/2017   Acute renal failure superimposed on stage 3a chronic kidney disease (HCC) 07/08/2019   Acute right-sided weakness    Allergies    Anasarca    Anemia 07/10/2017   Arthritis    CHF (congestive heart failure) (HCC)    "coinsided w/kidney problems I was having 06/2017"   Chicken pox    CKD (chronic kidney disease) stage 3, GFR 30-59 ml/min (HCC) 06/2017   Depression    Elevated troponin 07/08/2019   High cholesterol    History of cardiomyopathy    LVEF 40 to 45% in April 2019 - subsequently normalized   Hyperbilirubinemia 07/10/2017   Hypertension    Ischemic stroke (HCC)    Small left internal capsule infarct due to lacunar disease   Morbid obesity (HCC)    Normocytic anemia 12/16/2017   Recurrent incisional hernia with incarceration s/p repair 10/22/2017 10/21/2017   Stroke (HCC) 08/2017   "right sided weakness since; getting stronger though" (10/22/2017)   Type 2 diabetes mellitus (HCC)    Past Surgical History:  Procedure Laterality Date   ABDOMINAL HERNIA REPAIR  2008; 10/22/2017   "scope; OPEN REPAIR INCARCERATED VENTRAL HERNIA   HERNIA REPAIR     KNEE ARTHROSCOPY Right 1989   VENTRAL HERNIA REPAIR N/A 10/22/2017    Procedure: OPEN REPAIR INCARCERATED VENTRAL HERNIA;  Surgeon: Glenna Fellows, MD;  Location: [facility] OR;  Service: General;  Laterality: N/A;   Patient Active Problem List   Diagnosis Date Noted   Anxiety and depression 07/26/2022   Dysarthria 05/20/2022   Need for diphtheria-tetanus-pertussis (Tdap) vaccine 03/20/2022   Type 2 diabetes mellitus without complication, with long-term current use of insulin (HCC) 01/16/2022   Dyslipidemia 01/16/2022   Chronic diastolic CHF (congestive heart failure) (HCC) 01/16/2022   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    Intracranial hemorrhage (HCC)    HFrEF (heart failure with reduced ejection fraction) (HCC)    Obesity, Class III, BMI 40-49.9 (morbid obesity) (HCC) 05/22/2020   Other cirrhosis of liver (HCC)    Ventral hernia without obstruction or gangrene    Shortness of breath    Neck pain    Prostate cancer screening 01/21/2020   Lymphedema of both lower extremities 10/17/2019   History of stroke 10/17/2019   CKD (chronic kidney disease) stage 3, GFR 30-59 ml/min (HCC) 08/31/2019   Swelling of limb 08/31/2019   DM (diabetes mellitus), type 2 with complications (HCC) 07/08/2019   Vitreous floaters of both eyes 06/03/2018   Moderate obstructive sleep apnea 02/11/2018  Chronic fatigue 12/16/2017   Essential hypertension 12/16/2017   History of small bowel obstruction 10/24/2017   Adjustment disorder with depressed mood 09/30/2017   Stroke (HCC) 09/14/2017   Hyperlipidemia    Gout    Elevated LFTs    Heart failure with preserved ejection fraction (HCC)    Morbid obesity (HCC)    Hypoalbuminemia 07/11/2017   Type 2 diabetes mellitus with hyperglycemia (HCC) 07/10/2017    ONSET DATE: 05/20/22  REFERRING DIAG: Left sided weakness   THERAPY DIAG:  Muscle weakness (generalized)  Unsteadiness on feet  Difficulty in walking, not elsewhere classified  Rationale for Evaluation and Treatment:  Rehabilitation  SUBJECTIVE:                                                                                                                                                                                             SUBJECTIVE STATEMENT: Patient reports no falls or LOB since last session.    Pt accompanied by: self  PERTINENT HISTORY:   Patient presents for Left sided weakness. Patient presented to [facility] on 05/20/22 for L sided weakness and slurred speech.MRI negative.  PMH includes CVA (2019, 2023), obesity, Type II DM, HTN, HLD, lymphedema of BLE, Stage IIIa CKD, anemia, depression, hernia,arthritis, CHF. Patient had multiple strokes in  fall 2023 and received home health. 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 Patient has not worked the past two weeks due to another incident 2-3 weeks ago. Prior to stroke was independent, use a walker occasionally now.   PAIN:  Are you having pain? Yes: NPRS scale: 8/10 Pain location: R lower leg Pain description: aching Aggravating factors: Fall  Relieving factors: sitting down, keeping legs raised, hot shower  PRECAUTIONS: Fall  WEIGHT BEARING RESTRICTIONS: No  FALLS: Has patient fallen in last 6 months? Yes. Number of falls 1  LIVING ENVIRONMENT: Lives with: lives with their family Lives in: House/apartment Stairs: Yes: Internal: flight steps; on right going up Has following equipment at home: Dan Humphreys - 2 wheeled  PLOF: Independent  PATIENT GOALS: to get stronger  OBJECTIVE:   DIAGNOSTIC FINDINGS:   MRI: IMPRESSION: 1. No evidence of acute abnormality. 2. Prior hemorrhages, prior lacunar infarcts, and prior chronic microvascular ischemic disease which is advanced for patient age. Findings could be in part secondary to chronic hypertension.  CTA:   1. No emergent large vessel occlusion. 2. 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 3. Multifocal irregularity and moderate narrowing of the right vertebral artery,  mildly progressed.  COGNITION: Overall cognitive status:  short term memory issues s/p stroke.    SENSATION: Light touch: Impaired  LLE  COORDINATION: LLE coordination impaired with delay and limited coordination.   EDEMA:  Bilateral lymphedema of LE's   POSTURE: rounded shoulders, forward head, and flexed trunk    LOWER EXTREMITY MMT:    MMT Right Eval Left Eval  Hip flexion 3 3  Hip extension    Hip abduction 4- 3+  Hip adduction 4- 3+  Hip internal rotation    Hip external rotation    Knee flexion 3+ 3  Knee extension 3+ 3  Ankle dorsiflexion 4- 3-  Ankle plantarflexion 4- 3+  Ankle inversion    Ankle eversion    (Blank rows = not tested)   TRANSFERS: Assistive device utilized: None  Sit to stand: CGA Stand to sit: CGA Chair to chair: CGA    GAIT: Gait pattern: step through pattern, decreased stride length, antalgic, wide BOS, poor foot clearance- Right, and poor foot clearance- Left Distance walked: 40 ft Assistive device utilized: None Level of assistance: CGA Comments: Patient is unsteady without AD, has antalgic gait pattern with R knee however has limited strength for weightbearing on LLE.   FUNCTIONAL TESTS:  5 times sit to stand: 41 seconds  10 meter walk test: 21 seconds increased knee pain 6/10 Berg Balance Scale: 14/56  PATIENT SURVEYS:  FOTO 44  TODAY'S TREATMENT:                                                                                                                              DATE: 09/02/22    Neuro Re-ed: -ball toss 15x standing rainbow ball with CGA and SPT; cues for coordination and stabilization with pertubation -standing soccer ball kicks in // bars for pertubation stabilization, BUE support x 3 minutes  TherEx: Standing in // bars: -lateral step 2x length -high knee march 2x length of //bars; BUE support; cues for height of knee; x 2 trials with seated rest break    Seated with # 2.5 ankle weights ; -Seated  marches with upright posture, back away from back of chair for abdominal/trunk activation/stabilization, 10x each LE -Seated LAQ with 3 second holds, 10x each LE, cueing for muscle activation and sequencing for neutral alignment -Seated IR/ER with cueing for stabilizing knee placement with lateral foot movement for optimal muscle recruitment, 10x each LE -heel raise 20x    Seated:  Adduction ball squeeze 15x  Hamstring isometric press into dynadisc 10x 5 second holds ; cues for breathing  Dynadisc df/pf 20x each LE       Pt required occasional rest breaks due fatigue    PATIENT EDUCATION: Education details: goals, POC, HEP Person educated: Patient Education method: Explanation, Demonstration, Tactile cues, and Verbal cues Education comprehension: verbalized understanding, returned demonstration, verbal cues required, and tactile cues required  HOME EXERCISE PROGRAM: Access Code: [email or web address] URL: [email or web address] Date: 05/30/2022 Prepared by: Precious Bard  Exercises - Leg Extension  - 1 x daily - 7 x weekly - 2 sets - 10 reps -  5 hold - Seated March  - 1 x daily - 7 x weekly - 2 sets - 10 reps - 5 hold - Seated Ankle Circles  - 1 x daily - 7 x weekly - 2 sets - 10 reps - 5 hold  GOALS: Goals reviewed with patient? Yes  SHORT TERM GOALS: Target date: 06/27/2022    Patient will be independent in home exercise program to improve strength/mobility for better functional independence with ADLs. Baseline: 2/29: HEP given 4/24: completing 3-4 times per week with no issues  Goal status: MET   LONG TERM GOALS: Target date: 11/12/2022      Patient will increase FOTO score to equal to or greater than  54%   to demonstrate statistically significant improvement in mobility and quality of life.  Baseline: 2/29: 44% 4/24:55% Goal status: MET  2.  Patient (< 44 years old) will complete five times sit to stand test in < 10 seconds indicating an increased LE  strength and improved balance. Baseline: 2/29: 41 seconds with significant UE support 07/24/22:19.38 R UE support only 5/21: 21 seconds without UE support Goal status: IN PROGRESS  3.  Patient will increase Berg Balance score by > 8 points (44/56) to demonstrate decreased fall risk during functional activities. Baseline: 2/29: 14/56 4/24: 36/56 5/21: 39 Goal status: Partially Met  4.  Patient will increase 10 meter walk test to >1.11m/s as to improve gait speed for better community ambulation and to reduce fall risk. Baseline: 2/29: 21 seconds increased knee pain 4/24:20 sec 5/21: 15 seconds  Goal status: Partially Met     ASSESSMENT:  CLINICAL IMPRESSION: Patient tolerates progressive stabilization interventions in standing with decreased need for support. Patient requires intermittent seated rest breaks. Patient tolerates seated interventions better than standing in general but is highly motivated throughout session. Cues for sequencing of exercises required for body mechanics due to limited vision. Pt will continue to benefit from skilled physical therapy intervention to address impairments, improve QOL, and attain therapy goals.     OBJECTIVE IMPAIRMENTS: Abnormal gait, cardiopulmonary status limiting activity, decreased activity tolerance, decreased balance, decreased coordination, decreased endurance, decreased mobility, difficulty walking, decreased strength, increased edema, impaired perceived functional ability, impaired flexibility, impaired sensation, impaired UE functional use, impaired vision/preception, improper body mechanics, postural dysfunction, obesity, and pain.   ACTIVITY LIMITATIONS: carrying, lifting, bending, sitting, standing, squatting, stairs, transfers, bed mobility, bathing, toileting, dressing, reach over head, hygiene/grooming, locomotion level, and caring for others  PARTICIPATION LIMITATIONS: meal prep, cleaning, laundry, personal finances, driving, shopping,  community activity, occupation, yard work, school, and church  PERSONAL FACTORS: Age, Behavior pattern, Education, Financial risk analyst, Past/current experiences, Profession, Social background, Time since onset of injury/illness/exacerbation, Transportation, and 3+ comorbidities: CVA (2019, 2023), obesity, Type II DM, HTN, HLD, lymphedema of BLE, Stage IIIa CKD, anemia, depression, hernia,arthritis, CHF  are also affecting patient's functional outcome.   REHAB POTENTIAL: Good  CLINICAL DECISION MAKING: Evolving/moderate complexity  EVALUATION COMPLEXITY: Moderate  PLAN:  PT FREQUENCY: 2x/week  PT DURATION: 12 weeks  PLANNED INTERVENTIONS: Therapeutic exercises, Therapeutic activity, Neuromuscular re-education, Balance training, Gait training, Patient/Family education, Self Care, Joint mobilization, Joint manipulation, Stair training, Vestibular training, Canalith repositioning, Visual/preceptual remediation/compensation, Orthotic/Fit training, DME instructions, Cognitive remediation, Electrical stimulation, Spinal mobilization, Cryotherapy, Manual lymph drainage, Compression bandaging, Taping, Vasopneumatic device, Traction, Ultrasound, Ionotophoresis 4mg /ml Dexamethasone, Manual therapy, and Re-evaluation  PLAN FOR NEXT SESSION: balance, strength, ambulatory endurance training    Precious Bard, PT 09/02/2022, 4:44 PM

## 2022-09-02 NOTE — Telephone Encounter (Signed)
Pt scheduled on 6/25

## 2022-09-03 ENCOUNTER — Encounter: Payer: Self-pay | Admitting: Physician Assistant

## 2022-09-04 ENCOUNTER — Ambulatory Visit: Payer: Medicaid Other | Admitting: Occupational Therapy

## 2022-09-04 ENCOUNTER — Ambulatory Visit: Payer: Medicaid Other

## 2022-09-05 ENCOUNTER — Ambulatory Visit: Payer: Medicaid Other

## 2022-09-06 ENCOUNTER — Other Ambulatory Visit: Payer: Self-pay

## 2022-09-06 DIAGNOSIS — F4321 Adjustment disorder with depressed mood: Secondary | ICD-10-CM

## 2022-09-06 DIAGNOSIS — F419 Anxiety disorder, unspecified: Secondary | ICD-10-CM

## 2022-09-06 NOTE — Telephone Encounter (Signed)
Patient is changing pharmacy.

## 2022-09-06 NOTE — Telephone Encounter (Signed)
Opened in error

## 2022-09-08 ENCOUNTER — Observation Stay
Admission: EM | Admit: 2022-09-08 | Discharge: 2022-09-10 | Disposition: A | Discharge status: Home / Self Care | Payer: Medicaid Other | Attending: Internal Medicine | Admitting: Internal Medicine

## 2022-09-08 ENCOUNTER — Other Ambulatory Visit: Payer: Self-pay

## 2022-09-08 ENCOUNTER — Emergency Department: Payer: Medicaid Other

## 2022-09-08 DIAGNOSIS — I10 Essential (primary) hypertension: Secondary | ICD-10-CM | POA: Diagnosis present

## 2022-09-08 DIAGNOSIS — E118 Type 2 diabetes mellitus with unspecified complications: Secondary | ICD-10-CM | POA: Diagnosis present

## 2022-09-08 DIAGNOSIS — Z7901 Long term (current) use of anticoagulants: Secondary | ICD-10-CM | POA: Insufficient documentation

## 2022-09-08 DIAGNOSIS — I480 Paroxysmal atrial fibrillation: Secondary | ICD-10-CM | POA: Diagnosis not present

## 2022-09-08 DIAGNOSIS — D649 Anemia, unspecified: Secondary | ICD-10-CM | POA: Diagnosis not present

## 2022-09-08 DIAGNOSIS — R1084 Generalized abdominal pain: Secondary | ICD-10-CM | POA: Diagnosis not present

## 2022-09-08 DIAGNOSIS — I130 Hypertensive heart and chronic kidney disease with heart failure and stage 1 through stage 4 chronic kidney disease, or unspecified chronic kidney disease: Secondary | ICD-10-CM | POA: Diagnosis not present

## 2022-09-08 DIAGNOSIS — K3189 Other diseases of stomach and duodenum: Secondary | ICD-10-CM

## 2022-09-08 DIAGNOSIS — I639 Cerebral infarction, unspecified: Secondary | ICD-10-CM | POA: Diagnosis present

## 2022-09-08 DIAGNOSIS — C160 Malignant neoplasm of cardia: Secondary | ICD-10-CM

## 2022-09-08 DIAGNOSIS — R634 Abnormal weight loss: Secondary | ICD-10-CM | POA: Diagnosis not present

## 2022-09-08 DIAGNOSIS — Z8673 Personal history of transient ischemic attack (TIA), and cerebral infarction without residual deficits: Secondary | ICD-10-CM

## 2022-09-08 DIAGNOSIS — K922 Gastrointestinal hemorrhage, unspecified: Secondary | ICD-10-CM

## 2022-09-08 DIAGNOSIS — I5033 Acute on chronic diastolic (congestive) heart failure: Secondary | ICD-10-CM | POA: Diagnosis present

## 2022-09-08 DIAGNOSIS — I5032 Chronic diastolic (congestive) heart failure: Secondary | ICD-10-CM | POA: Insufficient documentation

## 2022-09-08 DIAGNOSIS — K439 Ventral hernia without obstruction or gangrene: Secondary | ICD-10-CM | POA: Diagnosis present

## 2022-09-08 DIAGNOSIS — N183 Chronic kidney disease, stage 3 unspecified: Secondary | ICD-10-CM | POA: Diagnosis present

## 2022-09-08 DIAGNOSIS — Z79899 Other long term (current) drug therapy: Secondary | ICD-10-CM | POA: Insufficient documentation

## 2022-09-08 DIAGNOSIS — I482 Chronic atrial fibrillation, unspecified: Secondary | ICD-10-CM | POA: Diagnosis present

## 2022-09-08 DIAGNOSIS — R933 Abnormal findings on diagnostic imaging of other parts of digestive tract: Secondary | ICD-10-CM | POA: Diagnosis not present

## 2022-09-08 DIAGNOSIS — D500 Iron deficiency anemia secondary to blood loss (chronic): Secondary | ICD-10-CM

## 2022-09-08 DIAGNOSIS — I2510 Atherosclerotic heart disease of native coronary artery without angina pectoris: Secondary | ICD-10-CM | POA: Insufficient documentation

## 2022-09-08 DIAGNOSIS — R1314 Dysphagia, pharyngoesophageal phase: Secondary | ICD-10-CM | POA: Insufficient documentation

## 2022-09-08 DIAGNOSIS — R1030 Lower abdominal pain, unspecified: Secondary | ICD-10-CM

## 2022-09-08 DIAGNOSIS — E1122 Type 2 diabetes mellitus with diabetic chronic kidney disease: Secondary | ICD-10-CM | POA: Diagnosis not present

## 2022-09-08 DIAGNOSIS — N1831 Chronic kidney disease, stage 3a: Secondary | ICD-10-CM | POA: Insufficient documentation

## 2022-09-08 DIAGNOSIS — G4733 Obstructive sleep apnea (adult) (pediatric): Secondary | ICD-10-CM | POA: Diagnosis present

## 2022-09-08 DIAGNOSIS — D509 Iron deficiency anemia, unspecified: Secondary | ICD-10-CM | POA: Diagnosis present

## 2022-09-08 DIAGNOSIS — R109 Unspecified abdominal pain: Secondary | ICD-10-CM | POA: Diagnosis present

## 2022-09-08 LAB — COMPREHENSIVE METABOLIC PANEL
ALT: 10 U/L (ref 0–44)
AST: 18 U/L (ref 15–41)
Albumin: 3.6 g/dL (ref 3.5–5.0)
Alkaline Phosphatase: 71 U/L (ref 38–126)
Anion gap: 12 (ref 5–15)
BUN: 17 mg/dL (ref 6–20)
CO2: 19 mmol/L — ABNORMAL LOW (ref 22–32)
Calcium: 8.6 mg/dL — ABNORMAL LOW (ref 8.9–10.3)
Chloride: 98 mmol/L (ref 98–111)
Creatinine, Ser: 1.28 mg/dL — ABNORMAL HIGH (ref 0.61–1.24)
GFR, Estimated: >60 mL/min (ref >60)
Glucose, Bld: 123 mg/dL — ABNORMAL HIGH (ref 70–99)
Potassium: 4.2 mmol/L (ref 3.5–5.1)
Sodium: 129 mmol/L — ABNORMAL LOW (ref 135–145)
Total Bilirubin: 1.1 mg/dL (ref 0.3–1.2)
Total Protein: 8 g/dL (ref 6.5–8.1)

## 2022-09-08 LAB — CBC
HCT: 30.2 % — ABNORMAL LOW (ref 39.0–52.0)
Hemoglobin: 9.2 g/dL — ABNORMAL LOW (ref 13.0–17.0)
MCH: 24.2 pg — ABNORMAL LOW (ref 26.0–34.0)
MCHC: 30.5 g/dL (ref 30.0–36.0)
MCV: 79.5 fL — ABNORMAL LOW (ref 80.0–100.0)
Platelets: 311 10*3/uL (ref 150–400)
RBC: 3.8 MIL/uL — ABNORMAL LOW (ref 4.22–5.81)
RDW: 14.9 % (ref 11.5–15.5)
WBC: 7.9 10*3/uL (ref 4.0–10.5)
nRBC: 0 % (ref 0.0–0.2)

## 2022-09-08 LAB — LIPASE, BLOOD: Lipase: 36 U/L (ref 11–51)

## 2022-09-08 MED ORDER — CARVEDILOL 6.25 MG PO TABS
12.5000 mg | ORAL_TABLET | Freq: Two times a day (BID) | ORAL | Status: DC
  Administered 2022-09-09 – 2022-09-10 (×3): 12.5 mg via ORAL
  Filled 2022-09-08 (×3): qty 2

## 2022-09-08 MED ORDER — MORPHINE SULFATE (PF) 2 MG/ML IV SOLN
2.0000 mg | INTRAVENOUS | Status: DC | PRN
  Administered 2022-09-09: 2 mg via INTRAVENOUS
  Filled 2022-09-08: qty 1

## 2022-09-08 MED ORDER — SODIUM CHLORIDE 0.9% FLUSH
3.0000 mL | Freq: Two times a day (BID) | INTRAVENOUS | Status: DC
  Administered 2022-09-08: 3 mL via INTRAVENOUS

## 2022-09-08 MED ORDER — MORPHINE SULFATE (PF) 4 MG/ML IV SOLN
4.0000 mg | Freq: Once | INTRAVENOUS | Status: AC
  Administered 2022-09-08: 4 mg via INTRAVENOUS
  Filled 2022-09-08: qty 1

## 2022-09-08 MED ORDER — ACETAMINOPHEN 325 MG RE SUPP: 650.0000 mg | Freq: Four times a day (QID) | RECTAL | Status: DC | PRN

## 2022-09-08 MED ORDER — ONDANSETRON HCL 4 MG/2ML IJ SOLN: 4.0000 mg | Freq: Four times a day (QID) | INTRAMUSCULAR | Status: DC | PRN

## 2022-09-08 MED ORDER — ACETAMINOPHEN 325 MG PO TABS
650.0000 mg | ORAL_TABLET | Freq: Four times a day (QID) | ORAL | Status: DC | PRN
  Administered 2022-09-10: 650 mg via ORAL
  Filled 2022-09-08: qty 2

## 2022-09-08 MED ORDER — BUSPIRONE HCL 5 MG PO TABS
7.5000 mg | ORAL_TABLET | Freq: Two times a day (BID) | ORAL | Status: DC
  Administered 2022-09-08 – 2022-09-10 (×4): 7.5 mg via ORAL
  Filled 2022-09-08 (×4): qty 2

## 2022-09-08 MED ORDER — IOHEXOL 350 MG/ML SOLN
100.0000 mL | Freq: Once | INTRAVENOUS | Status: AC | PRN
  Administered 2022-09-08: 100 mL via INTRAVENOUS

## 2022-09-08 MED ORDER — IRBESARTAN 75 MG PO TABS
75.0000 mg | ORAL_TABLET | Freq: Every day | ORAL | Status: DC
  Administered 2022-09-09 – 2022-09-10 (×2): 75 mg via ORAL
  Filled 2022-09-08 (×2): qty 1

## 2022-09-08 MED ORDER — HYDRALAZINE HCL 50 MG PO TABS
50.0000 mg | ORAL_TABLET | Freq: Two times a day (BID) | ORAL | Status: DC
  Administered 2022-09-08 – 2022-09-10 (×4): 50 mg via ORAL
  Filled 2022-09-08 (×4): qty 1

## 2022-09-08 MED ORDER — ONDANSETRON HCL 4 MG/2ML IJ SOLN
4.0000 mg | Freq: Once | INTRAMUSCULAR | Status: AC | PRN
  Administered 2022-09-08: 4 mg via INTRAVENOUS
  Filled 2022-09-08: qty 2

## 2022-09-08 MED ORDER — PANTOPRAZOLE 80MG IVPB - SIMPLE MED
80.0000 mg | Freq: Once | INTRAVENOUS | Status: AC
  Administered 2022-09-08: 80 mg via INTRAVENOUS
  Filled 2022-09-08: qty 100

## 2022-09-08 MED ORDER — APIXABAN 5 MG PO TABS
5.0000 mg | ORAL_TABLET | Freq: Two times a day (BID) | ORAL | Status: DC
  Administered 2022-09-08: 5 mg via ORAL
  Filled 2022-09-08: qty 1

## 2022-09-08 MED ORDER — ONDANSETRON HCL 4 MG PO TABS: 4.0000 mg | ORAL_TABLET | Freq: Four times a day (QID) | ORAL | Status: DC | PRN

## 2022-09-08 MED ORDER — ATORVASTATIN CALCIUM 20 MG PO TABS
40.0000 mg | ORAL_TABLET | Freq: Every day | ORAL | Status: DC
  Administered 2022-09-09: 40 mg via ORAL
  Filled 2022-09-08: qty 2

## 2022-09-08 MED ORDER — HYDRALAZINE HCL 20 MG/ML IJ SOLN: 5.0000 mg | INTRAMUSCULAR | Status: DC | PRN

## 2022-09-08 NOTE — ED Notes (Signed)
[place] sandwich and ice water given to patient per request. No distress noted at this time.

## 2022-09-08 NOTE — H&P (Signed)
History and Physical    Patient: Jeremiah Vance UEA:[id number] DOB: 11/04/1970 DOA: 09/08/2022 DOS: the patient was seen and examined on 09/08/2022 PCP: Debera Lat, PA-C  Patient coming from: Home  Chief Complaint:  Chief Complaint  Patient presents with   Abdominal Pain    X 2 days (hernia)    HPI: Jeremiah Vance is a 52 y.o. male with medical history significant for abdominal pain.  Abd pain at his hernia.  Has been there a long time but the abdominal pain is just started 2 days ago and he has been having vomiting.  Patient has had of repair of the incarcerated ventral hernia and in 2019.  Denies any melena bleeding chest pain shortness of breath palpitations fevers chills.  Initial vitals showed blood pressure 174/82 afebrile O2 sats of 98%.  Blood work shows neutropenia of 129 none anion gap metabolic acidosis with a bicarb of 19 CKD with a creatinine of 1.28, normal LFTs.  CBC showing anemia as well as chronic with a hemoglobin of 9.2 CT abdomen done today showed abnormal mass in the GE junction concerning for gastric cancer, peritoneal metastatic disease, no bowel obstruction, gallstones. Patient is guaiac positive per ED MD.  Chart review shows patient has an upcoming colonoscopy appointment with GI for colonoscopy.  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 Pt states he has has 5 strokes and has not had colonoscopy or egd. No gross bleeding or bruising. No melena. Pt has heart md for his a.fib and is on eliquis.  Admission requested for abdominal pain and CT finding consistent for cancer, anemia with guaiac positive stools on Eliquis.  Review of Systems: Review of Systems  Cardiovascular:  Positive for leg swelling.  Gastrointestinal:  Positive for abdominal pain and nausea.  All other systems reviewed and are negative.  Past Medical History:  Diagnosis Date   Acute  ischemic stroke (HCC) 09/06/2017   Acute renal failure superimposed on stage 3a chronic kidney disease (HCC) 07/08/2019   Acute right-sided weakness    Allergies    Anasarca    Anemia 07/10/2017   Arthritis    CHF (congestive heart failure) (HCC)    "coinsided w/kidney problems I was having 06/2017"   Chicken pox    CKD (chronic kidney disease) stage 3, GFR 30-59 ml/min (HCC) 06/2017   Depression    Elevated troponin 07/08/2019   High cholesterol    History of cardiomyopathy    LVEF 40 to 45% in April 2019 - subsequently normalized   Hyperbilirubinemia 07/10/2017   Hypertension    Ischemic stroke (HCC)    Small left internal capsule infarct due to lacunar disease   Morbid obesity (HCC)    Normocytic anemia 12/16/2017   Recurrent incisional hernia with incarceration s/p repair 10/22/2017 10/21/2017   Stroke (HCC) 08/2017   "right sided weakness since; getting stronger though" (10/22/2017)   Type 2 diabetes mellitus (HCC)    Past Surgical History:  Procedure Laterality Date   ABDOMINAL HERNIA REPAIR  2008; 10/22/2017   "scope; OPEN REPAIR INCARCERATED VENTRAL HERNIA   HERNIA REPAIR     KNEE ARTHROSCOPY Right 1989   VENTRAL HERNIA REPAIR N/A 10/22/2017   Procedure: OPEN REPAIR INCARCERATED VENTRAL HERNIA;  Surgeon: Glenna Fellows, MD;  Location: [facility] OR;  Service: General;  Laterality: N/A;   Social History:  reports that he has never smoked. He has never used smokeless tobacco. He  reports that he does not currently use alcohol. He reports that he does not use drugs.  Allergies  Allergen Reactions   Pollen Extract Other (See Comments)   Sulfa Antibiotics     Family History  Problem Relation Age of Onset   Diabetes Mother    Stroke Mother    Arthritis Mother    Depression Mother    Heart disease Mother    Hypertension Mother    Learning disabilities Mother    Mental illness Mother    Sleep apnea Mother    Diabetes Father    Heart disease Father    Arthritis Father    Hearing  loss Father    Hyperlipidemia Father    Heart attack Father    Hypertension Father    Stroke Father    Diabetes Sister    Depression Sister    Diabetes Sister    Hypertension Sister    Mental illness Sister    Sleep apnea Sister    Diabetes Maternal Grandmother    Heart disease Maternal Grandmother    Depression Maternal Grandmother    Hyperlipidemia Maternal Grandmother    Hypertension Maternal Grandmother    Stroke Maternal Grandmother    Hypertension Maternal Grandfather    Hyperlipidemia Maternal Grandfather    Stroke Maternal Grandfather    Arthritis Paternal Grandmother    Hearing loss Paternal Grandmother    Stroke Paternal Grandfather    Heart disease Paternal Grandfather    Arthritis Paternal Grandfather    Heart attack Paternal Grandfather     Prior to Admission medications   Medication Sig Start Date End Date Taking? Authorizing Provider  apixaban (ELIQUIS) 5 MG TABS tablet Take 1 tablet (5 mg total) by mouth 2 (two) times daily. 04/19/22   Charlsie Quest, NP  atorvastatin (LIPITOR) 40 MG tablet Take 1 tablet (40 mg total) by mouth daily at 6 PM. 11/26/21   Enedina Finner, MD  busPIRone (BUSPAR) 7.5 MG tablet Take 1 tablet (7.5 mg total) by mouth 2 (two) times daily. 07/24/22   Debera Lat, PA-C  carvedilol (COREG) 12.5 MG tablet Take 1 tablet (12.5 mg total) by mouth 2 (two) times daily with a meal. 11/26/21   Enedina Finner, MD  Continuous Blood Gluc Sensor (FREESTYLE LIBRE 2 SENSOR) MISC Place 1 sensor on the skin every 14 days. Use to check glucose continuously 05/01/22   Debera Lat, PA-C  dapagliflozin propanediol (FARXIGA) 10 MG TABS tablet Take 1 tablet (10 mg) by mouth once daily/please contact office to schedule follow up prior to further refills. 08/27/22   Debbe Odea, MD  hydrALAZINE (APRESOLINE) 50 MG tablet Take 1 tablet (50 mg total) by mouth in the morning and at bedtime. please contact office to schedule follow up prior to further refills. 08/27/22    Debbe Odea, MD  irbesartan (AVAPRO) 300 MG tablet Take 300 mg by mouth daily.    [provider]  torsemide (DEMADEX) 20 MG tablet Take 2 tablets (40 mg total) by mouth daily. 11/26/21   Enedina Finner, MD     Vitals:   09/08/22 1847 09/08/22 2100 09/08/22 2130 09/08/22 2132  BP: (!) 174/82 (!) 165/109 (!) 165/101   Pulse: 98 74 72   Resp: 16 20 (!) 24   Temp: 98 F (36.7 C)     TempSrc: Oral     SpO2: 98% 93% (!) 87% 95%  Weight:      Height:       Physical Exam Vitals and nursing note  reviewed.  Constitutional:      General: He is not in acute distress.    Appearance: He is obese.  HENT:     Head: Normocephalic and atraumatic.     Right Ear: Hearing normal.     Left Ear: Hearing normal.     Nose: Nose normal. No nasal deformity.     Mouth/Throat:     Lips: Pink.     Tongue: No lesions.     Pharynx: Oropharynx is clear.  Eyes:     General: Lids are normal.     Extraocular Movements: Extraocular movements intact.  Cardiovascular:     Rate and Rhythm: Normal rate. Rhythm irregular.     Heart sounds: Normal heart sounds.  Pulmonary:     Effort: Pulmonary effort is normal.     Breath sounds: Normal breath sounds.  Abdominal:     General: Abdomen is protuberant. Bowel sounds are decreased. There is distension.     Palpations: Abdomen is soft.     Tenderness: There is abdominal tenderness. There is guarding.     Hernia: A hernia is present. Hernia is present in the ventral area.    Musculoskeletal:     Right lower leg: Edema present.     Left lower leg: Edema present.  Skin:    General: Skin is warm.  Neurological:     General: No focal deficit present.     Mental Status: He is alert and oriented to person, place, and time.     Cranial Nerves: Cranial nerves 2-12 are intact.  Psychiatric:        Attention and Perception: Attention normal.        Speech: Speech normal.        Behavior: Behavior is cooperative.    Labs on Admission: I have  personally reviewed following labs .  CBC: Recent Labs  Lab 09/08/22 1851  WBC 7.9  HGB 9.2*  HCT 30.2*  MCV 79.5*  PLT 311   Basic Metabolic Panel: Recent Labs  Lab 09/08/22 1851  NA 129*  K 4.2  CL 98  CO2 19*  GLUCOSE 123*  BUN 17  CREATININE 1.28*  CALCIUM 8.6*   GFR: Estimated Creatinine Clearance: 88.4 mL/min (A) (by C-G formula based on SCr of 1.28 mg/dL (H)). Liver Function Tests: Recent Labs  Lab 09/08/22 1851  AST 18  ALT 10  ALKPHOS 71  BILITOT 1.1  PROT 8.0  ALBUMIN 3.6   Recent Labs  Lab 09/08/22 1851  LIPASE 36   BNP (last 3 results) Recent Labs    11/28/21 1520  PROBNP 966*  Urine analysis:    Component Value Date/Time   COLORURINE YELLOW (A) 01/16/2022 0750   APPEARANCEUR CLEAR (A) 01/16/2022 0750   LABSPEC 1.018 01/16/2022 0750   PHURINE 5.0 01/16/2022 0750   GLUCOSEU >=500 (A) 01/16/2022 0750   HGBUR NEGATIVE 01/16/2022 0750   BILIRUBINUR NEGATIVE 01/16/2022 0750   BILIRUBINUR negative 05/19/2020 1618   KETONESUR NEGATIVE 01/16/2022 0750   PROTEINUR 100 (A) 01/16/2022 0750   UROBILINOGEN 0.2 05/19/2020 1618   UROBILINOGEN 2.0 (H) 10/21/2017 1102   NITRITE NEGATIVE 01/16/2022 0750   LEUKOCYTESUR NEGATIVE 01/16/2022 0750    Radiological Exams on Admission: CT ABDOMEN PELVIS W CONTRAST  Result Date: 09/08/2022 CLINICAL DATA:  Worsening abdominal pain. EXAM: CT ABDOMEN AND PELVIS WITH CONTRAST TECHNIQUE: Multidetector CT imaging of the abdomen and pelvis was performed using the standard protocol following bolus administration of intravenous contrast. RADIATION DOSE REDUCTION: This exam  was performed according to the departmental dose-optimization program which includes automated exposure control, adjustment of the mA and/or kV according to patient size and/or use of iterative reconstruction technique. CONTRAST:  OMNIPAQUE IOHEXOL 350 MG/ML SOLN COMPARISON:  Noncontrast CT 02/20/2020. FINDINGS: Lower chest: Breathing motion at  the lung bases. No pleural effusion. There are several small lung nodules identified. Example middle lobe series 4, image 17 measuring 5 mm. Other lesions identified are also similar to smaller in size. Distribution is similar to the previous examination. Over 2 years of stability. No specific imaging follow-up. Heart is enlarged. Coronary artery calcifications are seen. Please correlate for other coronary risk factors. Hepatobiliary: No enhancing liver lesion. Patent portal vein. Stones in the nondilated gallbladder. Pancreas: Mild atrophy of the pancreatic parenchyma. Spleen: Normal in size without focal abnormality. Adrenals/Urinary Tract: Stable left adrenal myelolipoma. Right adrenal gland is preserved. No enhancing renal mass or collecting system dilatation. The calcifications seen along each kidney actually could be vascular based on the distribution. No collecting system dilatation. The ureters have normal course and caliber down to the bladder. Preserved contours of the urinary bladder. Stomach/Bowel: No oral contrast. The large bowel has a normal course and caliber with scattered stool and diverticula. Normal appendix. Small bowel is nondilated. There is nodular wall thickening along the upper stomach and GE junction region. Please correlate for a malignant lesion. Vascular/Lymphatic: Normal caliber aorta and IVC with scattered vascular calcifications. Extensive abnormal lymph node enlargement identified. This includes retrocrural on series 2, image 13 measuring 19 x 14 mm. Gastrohepatic ligament, periceliac region node left of midline on series 2, image 23 measuring 4.0 by 3.5 cm. Porta hepatic node series 2, image 25 measuring 3.4 by 2.4 cm. Left para-aortic lymph node more caudal on series 2, image 35 measuring 2.8 by 2.2 cm. Several other nodes are seen throughout these locations. There is also some mesenteric nodes, including along the greater curve of the stomach. Reproductive: Prostate is  unremarkable. Other: Large midline anterior pelvic wall hernia with rectus muscle diastasis. Herniation of small bowel and significant mesenteric fat. Scattered stranding. There are areas of nodularity along the peritoneum anteriorly such as left upper quadrant image 35 of series 2. There is some thickening of the lateral conal fascia on the left side. Please correlate for evidence of peritoneal carcinomatosis. No significant ascites. No free air. Musculoskeletal: Scattered degenerative changes of the spine and pelvis. Transitional lumbosacral segment. Skin thickening along the anterior pelvic wall and stranding. Mild anasarca. IMPRESSION: Diffuse masslike thickening in the area of the GE junction and upper stomach with the extensive abnormal lymph nodes throughout the abdomen including retrocrural, porta hepatis, retroperitoneum and lesser and greater curve of the stomach. In addition there are areas of suggest peritoneal carcinomatosis. Recommend further evaluation. No bowel obstruction. Large midline anterior pelvic wall hernia involving small bowel and mesenteric fat with rectus muscle diastasis. Gallstones. Critical Value/emergent results were called by telephone at the time of interpretation on 09/08/2022 at 5:40 pm to provider [facility] , who verbally acknowledged these results. Electronically Signed   By: Karen Kays M.D.   On: 09/08/2022 20:52     Data Reviewed: Relevant notes from primary care and specialist visits, past discharge summaries as available in EHR, including Care Everywhere. Prior diagnostic testing as pertinent to current admission diagnoses Updated medications and problem lists for reconciliation ED course, including vitals, labs, imaging, treatment and response to treatment Triage notes, nursing and pharmacy notes and ED provider's notes Notable results  as noted in HPI Assessment and Plan: * Abdominal pain Patient found to have large midline anterior pelvic wall hernia involving  small bowel-will request general surgery consult. Patient also found to have mass at the GE junction-will get GI consult. Diet continued and anticoagulation continued-patient is on Eliquis for A-fib and 5 strokes-until definitive plan for evaluation or intervention are in place.PRN morphine for pain.    Atrial fibrillation, chronic (HCC) Currently patient is in A-fib on telemetry.  Rate is controlled. Will obtain an EKG. patient's Coreg and Eliquis is continued  Chronic diastolic CHF (congestive heart failure) (HCC) Patient's last 2D echocardiogram in 2023 October shows: 1. Left ventricular ejection fraction, by estimation, is 55 to 60% . The left ventricle has normal function. The left ventricle has no regional wall motion abnormalities. There is mild left ventricular hypertrophy. Left ventricular diastolic parameters are consistent with Grade I diastolic dysfunction ( impaired relaxation) . 2. Right ventricular systolic function is normal. The right ventricular size is normal. 3. The mitral valve is grossly normal. No evidence of mitral valve regurgitation. 4. The aortic valve is calcified. Aortic valve regurgitation is not visualized. Aortic valve sclerosis/ calcification is present, without any evidence of aortic stenosis. 5. Aortic dilatation noted. There is mild dilatation of the ascending aorta, measuring 43 mm. 6. The inferior vena cava is normal in size with greater than 50% respiratory variability, suggesting right atrial pressure of 3 mmHg. 7. Agitated saline contrast bubble study was negative, with no evidence of any interatrial shunt. Currently patient denies any shortness of breath palpitations chest pressure chest pain. Does have bilateral lower extremity edema. Currently we will continue patient on Coreg, hydralazine, atorvastatin, Eliquis, Avapro.  CKD (chronic kidney disease) stage 3, GFR 30-59 ml/min (HCC) Lab Results  Component Value Date   CREATININE 1.28 (H) 09/08/2022    CREATININE 1.83 (H) 05/23/2022   CREATININE 1.63 (H) 05/20/2022  Stable we will follow and renally dose all needed medications.    DM (diabetes mellitus), type 2 with complications (HCC) Patient's last A1c was 6.1 about a month ago. Will continue patient on glycemic protocol.  Moderate obstructive sleep apnea Is soon to get a sleep study on outpatient basis per patient report.  Essential hypertension Vitals:   09/08/22 1847 09/08/22 2100 09/08/22 2130  BP: (!) 174/82 (!) 165/109 (!) 165/101  Patient continued on home regimen of hydralazine, Coreg, Avapro.   Stroke [facility]) Patient reports he has a history of 5 strokes currently has no focal deficits.  Patient is in A-fib currently rate is controlled.  And continue his Eliquis.    DVT prophylaxis:  Eliquis.   Consults:  General surgery: Dr. Maia Plan. Gi: Dr. Tobi Bastos.   Advance Care Planning:    Code Status: Full Code   Family Communication:  Wife at bedside.   Disposition Plan:  Back to previous home environment  Severity of Illness: The appropriate patient status for this patient is OBSERVATION. Observation status is judged to be reasonable and necessary in order to provide the required intensity of service to ensure the patient's safety. The patient's presenting symptoms, physical exam findings, and initial radiographic and laboratory data in the context of their medical condition is felt to place them at decreased risk for further clinical deterioration. Furthermore, it is anticipated that the patient will be medically stable for discharge from the hospital within 2 midnights of admission.   Author: Gertha Calkin, MD 09/08/2022 11:18 PM  For on call review [email or web address].

## 2022-09-08 NOTE — ED Provider Notes (Signed)
[facility] Provider Note    Event Date/Time   First MD Initiated Contact with Patient 09/08/22 2007     (approximate)   History   Abdominal Pain (X 2 days (hernia))   HPI  Jeremiah Vance is a 52 y.o. male   Past medical history of incarcerated ventral hernia repair in 2019, prior stroke, CAD, CHF, atrial fibrillation, who presents to the emergency department with abdominal pain over the last 2 days.  Very much pain as well as vomiting.  It has been present for many years and started calling symptoms today as ago.  Denies GI bleeding.  No fevers no chills. No trauma   No urinary sx  External Medical Documents Reviewed: 2019 surgery note for incarcerated ventral hernia repair      Physical Exam   Triage Vital Signs: ED Triage Vitals  Enc Vitals Group     BP 09/08/22 1847 (!) 174/82     Pulse Rate 09/08/22 1847 98     Resp 09/08/22 1847 16     Temp 09/08/22 1847 98 F (36.7 C)     Temp Source 09/08/22 1847 Oral     SpO2 09/08/22 1847 98 %     Weight 09/08/22 1845 268 lb 15.4 oz (122 kg)     Height 09/08/22 1845 5\' 10"  (1.778 m)     Head Circumference --      Peak Flow --      Pain Score 09/08/22 1845 8     Pain Loc --      Pain Edu? --      Excl. in GC? --     Most recent vital signs: Vitals:   09/08/22 1847  BP: (!) 174/82  Pulse: 98  Resp: 16  Temp: 98 F (36.7 C)  SpO2: 98%    General: Awake, appears uncomfortable, HTN  CV:  Good peripheral perfusion.  Resp:  Normal effort.  Abd:  No distention.  Other:  Large soft mildly tender hernia to the right lower quadrant of the abdomen without overlying skin changes.  The rest of the abdomen including epigastrium and right upper quadrant are mildly tender without rigidity or guarding.  His rectal exam shows brown stool but guaiac positive.  ED Results / Procedures / Treatments   Labs (all labs ordered are listed, but only abnormal results are displayed) Labs Reviewed   COMPREHENSIVE METABOLIC PANEL - Abnormal; Notable for the following components:      Result Value   Sodium 129 (*)    CO2 19 (*)    Glucose, Bld 123 (*)    Creatinine, Ser 1.28 (*)    Calcium 8.6 (*)    All other components within normal limits  CBC - Abnormal; Notable for the following components:   RBC 3.80 (*)    Hemoglobin 9.2 (*)    HCT 30.2 (*)    MCV 79.5 (*)    MCH 24.2 (*)    All other components within normal limits  LIPASE, BLOOD  URINALYSIS, ROUTINE W REFLEX MICROSCOPIC     I ordered and reviewed the above labs they are notable for nl WBC, H&H 9.2/30.2 which is decreased from prior testing in February with an H&H of 11.6/35   RADIOLOGY I independently reviewed and interpreted CT of the abdomen and pelvis and I see no obvious obstructive patterns   PROCEDURES:  Critical Care performed: No  Procedures   MEDICATIONS ORDERED IN ED: Medications  pantoprazole (PROTONIX) 80 mg /NS  100 mL IVPB (has no administration in time range)  ondansetron (ZOFRAN) injection 4 mg (4 mg Intravenous Given 09/08/22 1851)  morphine (PF) 4 MG/ML injection 4 mg (4 mg Intravenous Given 09/08/22 2029)  iohexol (OMNIPAQUE) 350 MG/ML injection 100 mL (100 mLs Intravenous Contrast Given 09/08/22 2020)    External physician / consultants:  I spoke with hospitalist for admission and regarding care plan for this patient.   IMPRESSION / MDM / ASSESSMENT AND PLAN / ED COURSE  I reviewed the triage vital signs and the nursing notes.                                Patient's presentation is most consistent with acute presentation with potential threat to life or bodily function.  Differential diagnosis includes, but is not limited to, incarcerated hernia, bowel obstruction, intra-abdominal infection   The patient is on the cardiac monitor to evaluate for evidence of arrhythmia and/or significant heart rate changes.  MDM: History of ventral hernia incarcerated requiring repair in 2019 with  similar symptoms today with pain around his, vomiting.  Pain control with IV morphine and get a CT scan with IV contrast abdomen pelvis to assess for obstruction, surgical pathologies ---  CT scan shows findings consistent with gastric cancer, metastatic.  I shared these findings with the patient.  He also has a drop in his hemoglobin of 2 points he is on blood thinners and has guaiac positive stool.  Will be admitted for abdominal pain, likely gastric cancer, likely upper GI bleeding and should have oncology consultation while inpatient.        FINAL CLINICAL IMPRESSION(S) / ED DIAGNOSES   Final diagnoses:  Lower abdominal pain  Gastric mass  Gastrointestinal hemorrhage, unspecified gastrointestinal hemorrhage type     Rx / DC Orders   ED Discharge Orders          Ordered    Ambulatory referral to Hematology / Oncology        09/08/22 2108             Note:  This document was prepared using Dragon voice recognition software and may include unintentional dictation errors.    Pilar Jarvis, MD 09/08/22 2111

## 2022-09-08 NOTE — Assessment & Plan Note (Addendum)
Patient found to have large midline anterior pelvic wall hernia involving small bowel-will request general surgery consult. Patient also found to have mass at the GE junction-will get GI consult. Diet continued and anticoagulation continued-patient is on Eliquis for A-fib and 5 strokes-until definitive plan for evaluation or intervention are in place.PRN morphine for pain.

## 2022-09-08 NOTE — Assessment & Plan Note (Signed)
Is soon to get a sleep study on outpatient basis per patient report.

## 2022-09-08 NOTE — ED Notes (Signed)
This RN present at bedside to serve as chaperone during rectal exam.

## 2022-09-08 NOTE — ED Notes (Signed)
Patient in CT at this time.

## 2022-09-08 NOTE — Assessment & Plan Note (Signed)
Patient reports he has a history of 5 strokes currently has no focal deficits.  Patient is in A-fib currently rate is controlled.  And continue his Eliquis.

## 2022-09-08 NOTE — Assessment & Plan Note (Signed)
Lab Results  Component Value Date   CREATININE 1.28 (H) 09/08/2022   CREATININE 1.83 (H) 05/23/2022   CREATININE 1.63 (H) 05/20/2022  Stable we will follow and renally dose all needed medications.

## 2022-09-08 NOTE — Assessment & Plan Note (Signed)
Patient's last A1c was 6.1 about a month ago. Will continue patient on glycemic protocol.

## 2022-09-08 NOTE — Assessment & Plan Note (Signed)
Vitals:   09/08/22 1847 09/08/22 2100 09/08/22 2130  BP: (!) 174/82 (!) 165/109 (!) 165/101  Patient continued on home regimen of hydralazine, Coreg, Avapro.

## 2022-09-08 NOTE — ED Triage Notes (Signed)
Pt to ed from home via POV for a "hernia". Pt advised the hernia has been there awhile but the last 2 days it has caused so much pain he has been vomiting. Pt is very pale in triage. Pt is caox4 and in no acute distress.

## 2022-09-08 NOTE — Assessment & Plan Note (Signed)
Patient's last 2D echocardiogram in 2023 October shows: 1. Left ventricular ejection fraction, by estimation, is 55 to 60% . The left ventricle has normal function. The left ventricle has no regional wall motion abnormalities. There is mild left ventricular hypertrophy. Left ventricular diastolic parameters are consistent with Grade I diastolic dysfunction ( impaired relaxation) . 2. Right ventricular systolic function is normal. The right ventricular size is normal. 3. The mitral valve is grossly normal. No evidence of mitral valve regurgitation. 4. The aortic valve is calcified. Aortic valve regurgitation is not visualized. Aortic valve sclerosis/ calcification is present, without any evidence of aortic stenosis. 5. Aortic dilatation noted. There is mild dilatation of the ascending aorta, measuring 43 mm. 6. The inferior vena cava is normal in size with greater than 50% respiratory variability, suggesting right atrial pressure of 3 mmHg. 7. Agitated saline contrast bubble study was negative, with no evidence of any interatrial shunt. Currently patient denies any shortness of breath palpitations chest pressure chest pain. Does have bilateral lower extremity edema. Currently we will continue patient on Coreg, hydralazine, atorvastatin, Eliquis, Avapro.

## 2022-09-08 NOTE — Assessment & Plan Note (Addendum)
Currently patient is in A-fib on telemetry.  Rate is controlled. Will obtain an EKG. patient's Coreg and Eliquis is continued

## 2022-09-09 ENCOUNTER — Ambulatory Visit: Payer: Medicaid Other | Admitting: Physical Therapy

## 2022-09-09 ENCOUNTER — Other Ambulatory Visit: Payer: Self-pay

## 2022-09-09 ENCOUNTER — Encounter: Payer: Self-pay | Admitting: Internal Medicine

## 2022-09-09 ENCOUNTER — Ambulatory Visit: Payer: Medicaid Other

## 2022-09-09 DIAGNOSIS — K3189 Other diseases of stomach and duodenum: Secondary | ICD-10-CM

## 2022-09-09 DIAGNOSIS — R109 Unspecified abdominal pain: Secondary | ICD-10-CM | POA: Diagnosis not present

## 2022-09-09 DIAGNOSIS — I5032 Chronic diastolic (congestive) heart failure: Secondary | ICD-10-CM

## 2022-09-09 DIAGNOSIS — D509 Iron deficiency anemia, unspecified: Secondary | ICD-10-CM

## 2022-09-09 DIAGNOSIS — I480 Paroxysmal atrial fibrillation: Secondary | ICD-10-CM | POA: Diagnosis not present

## 2022-09-09 DIAGNOSIS — D500 Iron deficiency anemia secondary to blood loss (chronic): Secondary | ICD-10-CM | POA: Diagnosis not present

## 2022-09-09 DIAGNOSIS — R1084 Generalized abdominal pain: Principal | ICD-10-CM

## 2022-09-09 DIAGNOSIS — K922 Gastrointestinal hemorrhage, unspecified: Secondary | ICD-10-CM | POA: Diagnosis not present

## 2022-09-09 LAB — URINALYSIS, ROUTINE W REFLEX MICROSCOPIC
Bilirubin Urine: NEGATIVE
Glucose, UA: NEGATIVE mg/dL
Hgb urine dipstick: NEGATIVE
Ketones, ur: NEGATIVE mg/dL
Leukocytes,Ua: NEGATIVE
Nitrite: NEGATIVE
Protein, ur: 30 mg/dL — AB
Specific Gravity, Urine: 1.019 (ref 1.005–1.030)
pH: 5 (ref 5.0–8.0)

## 2022-09-09 LAB — BASIC METABOLIC PANEL
Anion gap: 10 (ref 5–15)
BUN: 18 mg/dL (ref 6–20)
CO2: 21 mmol/L — ABNORMAL LOW (ref 22–32)
Calcium: 8.5 mg/dL — ABNORMAL LOW (ref 8.9–10.3)
Chloride: 99 mmol/L (ref 98–111)
Creatinine, Ser: 1.4 mg/dL — ABNORMAL HIGH (ref 0.61–1.24)
GFR, Estimated: >60 mL/min (ref >60)
Glucose, Bld: 130 mg/dL — ABNORMAL HIGH (ref 70–99)
Potassium: 4.2 mmol/L (ref 3.5–5.1)
Sodium: 130 mmol/L — ABNORMAL LOW (ref 135–145)

## 2022-09-09 LAB — CBC
HCT: 27.3 % — ABNORMAL LOW (ref 39.0–52.0)
Hemoglobin: 8.2 g/dL — ABNORMAL LOW (ref 13.0–17.0)
MCH: 24.1 pg — ABNORMAL LOW (ref 26.0–34.0)
MCHC: 30 g/dL (ref 30.0–36.0)
MCV: 80.3 fL (ref 80.0–100.0)
Platelets: 279 10*3/uL (ref 150–400)
RBC: 3.4 MIL/uL — ABNORMAL LOW (ref 4.22–5.81)
RDW: 15.1 % (ref 11.5–15.5)
WBC: 7.6 10*3/uL (ref 4.0–10.5)
nRBC: 0 % (ref 0.0–0.2)

## 2022-09-09 LAB — FERRITIN: Ferritin: 9 ng/mL — ABNORMAL LOW (ref 24–336)

## 2022-09-09 LAB — IRON AND TIBC
Iron: 20 ug/dL — ABNORMAL LOW (ref 45–182)
Saturation Ratios: 6 % — ABNORMAL LOW (ref 17.9–39.5)
TIBC: 356 ug/dL (ref 250–450)
UIBC: 336 ug/dL

## 2022-09-09 LAB — VITAMIN B12: Vitamin B-12: 563 pg/mL (ref 180–914)

## 2022-09-09 MED ORDER — SODIUM CHLORIDE 0.9 % IV SOLN
200.0000 mg | Freq: Once | INTRAVENOUS | Status: AC
  Administered 2022-09-09: 200 mg via INTRAVENOUS
  Filled 2022-09-09: qty 200

## 2022-09-09 MED ORDER — SODIUM CHLORIDE 0.9 % IV SOLN
200.0000 mg | Freq: Once | INTRAVENOUS | Status: AC
  Administered 2022-09-10: 200 mg via INTRAVENOUS
  Filled 2022-09-09: qty 200

## 2022-09-09 MED ORDER — METOCLOPRAMIDE HCL 10 MG PO TABS
5.0000 mg | ORAL_TABLET | Freq: Three times a day (TID) | ORAL | Status: DC
  Administered 2022-09-09 – 2022-09-10 (×2): 5 mg via ORAL
  Filled 2022-09-09 (×2): qty 1

## 2022-09-09 MED ORDER — SODIUM CHLORIDE 0.9 % IV SOLN: INTRAVENOUS | Status: DC

## 2022-09-09 MED ORDER — BUSPIRONE HCL 7.5 MG PO TABS: 7.5000 mg | ORAL_TABLET | Freq: Two times a day (BID) | ORAL | 3 refills | Status: DC

## 2022-09-09 MED ORDER — TORSEMIDE 20 MG PO TABS
40.0000 mg | ORAL_TABLET | Freq: Every day | ORAL | Status: DC
  Administered 2022-09-09 – 2022-09-10 (×2): 40 mg via ORAL
  Filled 2022-09-09 (×2): qty 2

## 2022-09-09 NOTE — Evaluation (Signed)
Occupational Therapy Evaluation Patient Details Name: Jeremiah Vance MRN: [id number] DOB: Oct 09, 1970 Today's Date: 09/09/2022   History of Present Illness Jeremiah Vance is an 52 y.o. male with a PMHx of 2 prior strokes, CKD, anemia, Jeremiah Vance is a 52 y.o. male with medical history significant for abdominal pain and has been having vomiting. 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   Admitted for abdominal pain and CT finding consistent for cancer, anemia with guaiac positive stools on Eliquis.   Clinical Impression   Jeremiah Vance was seen for OT evaluation this date. Prior to hospital admission, pt was MODI for RW, going to outpatient PT/OT. Pt lives with sister in 2 level home. Pt reports 8/10 abdominal pain limiting LB access. Pt currently requires SUPERVISION + RW for toilet t/f. MIN A for LB access seated EOB 2/2 abdominal pain. Good static standing balance no UE support. SpO2 87% on RA, resolved to 90s with 2L [place]. Educated on ECS. No skilled acute OT needs identified, will sign off. Upon hospital discharge, recommend resuming current OT services.    Recommendations for follow up therapy are one component of a multi-disciplinary discharge planning process, led by the attending physician.  Recommendations may be updated based on patient status, additional functional criteria and insurance authorization.   Assistance Recommended at Discharge Set up Supervision/Assistance  Patient can return home with the following A little help with bathing/dressing/bathroom;Help with stairs or ramp for entrance    Functional Status Assessment  Patient has had a recent decline in their functional status and demonstrates the ability to make significant improvements in function in a reasonable and predictable amount of time.  Equipment Recommendations  BSC/3in1    Recommendations for Other Services        Precautions / Restrictions Precautions Precautions: Fall Restrictions Weight Bearing Restrictions: No      Mobility Bed Mobility Overal bed mobility: Modified Independent             General bed mobility comments: increased time    Transfers Overall transfer level: Needs assistance Equipment used: Rolling walker (2 wheels) Transfers: Sit to/from Stand Sit to Stand: Supervision                  Balance Overall balance assessment: No apparent balance deficits (not formally assessed)                                         ADL either performed or assessed with clinical judgement   ADL Overall ADL's : Needs assistance/impaired                                       General ADL Comments: SUPERVISION + RW for toilet t/f. MIN A for LB access seated EOB 2/2 abdominal pain.      Pertinent Vitals/Pain Pain Assessment Pain Assessment: 0-10 Pain Score: 8  Pain Location: abdomen Pain Descriptors / Indicators: Discomfort, Dull, Grimacing, Guarding Pain Intervention(s): Limited activity within patient's tolerance, Repositioned, Patient requesting pain meds-RN notified     Hand Dominance Right   Extremity/Trunk Assessment Upper Extremity Assessment Upper Extremity Assessment: Overall WFL for tasks assessed   Lower Extremity Assessment Lower Extremity Assessment: Generalized weakness  Communication Communication Communication: No difficulties   Cognition Arousal/Alertness: Awake/alert Behavior During Therapy: WFL for tasks assessed/performed Overall Cognitive Status: Within Functional Limits for tasks assessed                                                  Home Living Family/patient expects to be discharged to:: Private residence Living Arrangements: Other relatives (sister) Available Help at Discharge: Available PRN/intermittently;Family Type of Home: House       Home Layout: Two  level;Bed/bath upstairs Alternate Level Stairs-Number of Steps: flight Alternate Level Stairs-Rails: Right Bathroom Shower/Tub: Producer, television/film/video: Standard     Home Equipment: Shower seat - built Charity fundraiser (2 wheels)   Additional Comments: reports can sleep in recliner on 1st floor if needed      Prior Functioning/Environment Prior Level of Function : Independent/Modified Independent;Working/employed;History of Falls (last six months)             Mobility Comments: MOD I using RW ADLs Comments: sister does IADLs        OT Problem List: Decreased range of motion         OT Goals(Current goals can be found in the care plan section) Acute Rehab OT Goals Patient Stated Goal: to improve pain OT Goal Formulation: With patient Time For Goal Achievement: 09/23/22 Potential to Achieve Goals: Good   AM-PAC OT "6 Clicks" Daily Activity     Outcome Measure Help from another person eating meals?: None Help from another person taking care of personal grooming?: None Help from another person toileting, which includes using toliet, bedpan, or urinal?: A Little Help from another person bathing (including washing, rinsing, drying)?: A Little Help from another person to put on and taking off regular upper body clothing?: None Help from another person to put on and taking off regular lower body clothing?: A Little 6 Click Score: 21   End of Session Equipment Utilized During Treatment: Rolling walker (2 wheels) Nurse Communication: Patient requests pain meds  Activity Tolerance: Patient tolerated treatment well Patient left: in bed;with call bell/phone within reach  OT Visit Diagnosis: Other abnormalities of gait and mobility (R26.89);Muscle weakness (generalized) (M62.81)                Time: 0811-0822 OT Time Calculation (min): 11 min Charges:  OT General Charges $OT Visit: 1 Visit OT Evaluation $OT Eval Low Complexity: 1 Low  Jeremiah Vance, M.S. OTR/L   09/09/22, 9:14 AM  ascom [phone]

## 2022-09-09 NOTE — ED Notes (Signed)
Pt 97% on RA, states would feel better on [place]. 2 L [place] placed back for pt comfort

## 2022-09-09 NOTE — Progress Notes (Addendum)
Progress Note    Jeremiah Vance  ZOX:[id number] DOB: Dec 12, 1970  DOA: 09/08/2022 PCP: Debera Lat, PA-C      Brief Narrative:    Medical records reviewed and are as summarized below:  Jeremiah Vance is a 52 y.o. 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  He presented to the hospital with abdominal pain and nausea about 2 days duration.          Assessment/Plan:   Principal Problem:   Abdominal pain Active Problems:   Essential hypertension   Moderate obstructive sleep apnea   DM (diabetes mellitus), type 2 with complications (HCC)   CKD (chronic kidney disease) stage 3, GFR 30-59 ml/min (HCC)   History of stroke   Ventral hernia without obstruction or gangrene   Chronic diastolic CHF (congestive heart failure) (HCC)   Paroxysmal atrial fibrillation (HCC)   Iron deficiency anemia    Body mass index is 38.59 kg/m.  (Obesity)   Abdominal pain, masslike thickening in the area of the gastroesophageal junction and upper stomach with extensive abdominal and retroperitoneal lymphadenopathy, peritoneal carcinomatosis: Analgesics as needed for pain.  Consulted gastroenterologist for further evaluation.  Dr. Cathie Hoops, oncologist has also been consulted to assist with management.   Incisional abdominal hernia: He has been evaluated by general surgeon, Dr. Hazle Quant.  No indication for surgery at this time.  Outpatient follow-up recommended.   Iron deficiency anemia with positive heme stools: Hemoglobin dropped from 9.2-8.2.  Hemoglobin was 11.6 on 05/23/2022.  Iron 20, TIBC 356, saturation ratio 6 7 ferritin 9.  He will be given IV iron sucrose infusion.  Eliquis has been held.  Patient agreeable with the plan. Monitor H&H.  Follow-up  with gastroenterologist.   Chronic diastolic CHF: Compensated   Paroxysmal atrial fibrillation, CAD, history of stroke: Eliquis on hold for now because of positive heme stools, worsening anemia and in anticipation of endoscopic workup.   Other comorbidities include type II DM, OSA, hypertension  Diet Order             Diet heart healthy/carb modified Room service appropriate? Yes; Fluid consistency: Thin; Fluid restriction: 1500 mL Fluid  Diet effective now                            Consultants: General surgeon Gastroenterologist Oncologist  Procedures: None    Medications:    atorvastatin  40 mg Oral q1800   busPIRone  7.5 mg Oral BID   carvedilol  12.5 mg Oral BID WC   hydrALAZINE  50 mg Oral Q12H   irbesartan  75 mg Oral Daily   sodium chloride flush  3 mL Intravenous Q12H   torsemide  40 mg Oral Daily   Continuous Infusions:  iron sucrose       Anti-infectives (From admission, onward)    None              Family Communication/Anticipated D/C date and plan/Code Status   DVT prophylaxis:      Code Status: Full Code  Family Communication: Christina, aunt, at the bedside Disposition Plan: Plan to discharge home in 3 to 5 days   Status is: Observation The patient will require care spanning > 2 midnights and should be moved to inpatient because: Worsening anemia, may  need endoscopic workup       Subjective:   Interval events noted.  Nausea, abdominal pain, weakness, fatigue. No vomiting or bloody stools  Objective:    Vitals:   09/09/22 0430 09/09/22 0500 09/09/22 0520 09/09/22 0940  BP: (!) 164/106 (!) 160/104 (!) 153/95 (!) 146/99  Pulse: 83 77 82 87  Resp: 20 20 20 20   Temp:    97.8 F (36.6 C)  TempSrc:      SpO2: 99% 97% 96% 97%  Weight:      Height:       No data found.   Intake/Output Summary (Last 24 hours) at 09/09/2022 1233 Last data filed at 09/08/2022 2212 Gross per 24 hour  Intake 99.4 ml   Output --  Net 99.4 ml   Filed Weights   09/08/22 1845  Weight: 122 kg    Exam:  GEN: NAD SKIN: Warm and dry EYES: No pallor or icterus ENT: MMM CV: RRR PULM: CTA B ABD: soft, abdominal hernia, mid abdominal tenderness, +BS CNS: AAO x 3, non focal EXT: B/l leg edema, no tenderness        Data Reviewed:   I have personally reviewed following labs and imaging studies:  Labs: Labs show the following:   Basic Metabolic Panel: Recent Labs  Lab 09/08/22 1851 09/09/22 1050  NA 129* 130*  K 4.2 4.2  CL 98 99  CO2 19* 21*  GLUCOSE 123* 130*  BUN 17 18  CREATININE 1.28* 1.40*  CALCIUM 8.6* 8.5*   GFR Estimated Creatinine Clearance: 80.8 mL/min (A) (by C-G formula based on SCr of 1.4 mg/dL (H)). Liver Function Tests: Recent Labs  Lab 09/08/22 1851  AST 18  ALT 10  ALKPHOS 71  BILITOT 1.1  PROT 8.0  ALBUMIN 3.6   Recent Labs  Lab 09/08/22 1851  LIPASE 36   No results for input(s): "AMMONIA" in the last 168 hours. Coagulation profile No results for input(s): "INR", "PROTIME" in the last 168 hours.  CBC: Recent Labs  Lab 09/08/22 1851 09/09/22 1050  WBC 7.9 7.6  HGB 9.2* 8.2*  HCT 30.2* 27.3*  MCV 79.5* 80.3  PLT 311 279   Cardiac Enzymes: No results for input(s): "CKTOTAL", "CKMB", "CKMBINDEX", "TROPONINI" in the last 168 hours. BNP (last 3 results) Recent Labs    11/28/21 1520  PROBNP 966*   CBG: No results for input(s): "GLUCAP" in the last 168 hours. D-Dimer: No results for input(s): "DDIMER" in the last 72 hours. Hgb A1c: No results for input(s): "HGBA1C" in the last 72 hours. Lipid Profile: No results for input(s): "CHOL", "HDL", "LDLCALC", "TRIG", "CHOLHDL", "LDLDIRECT" in the last 72 hours. Thyroid function studies: No results for input(s): "TSH", "T4TOTAL", "T3FREE", "THYROIDAB" in the last 72 hours.  Invalid input(s): "FREET3" Anemia work up: Recent Labs    09/08/22 1850  FERRITIN 9*  TIBC 356  IRON 20*   Sepsis  Labs: Recent Labs  Lab 09/08/22 1851 09/09/22 1050  WBC 7.9 7.6    Microbiology No results found for this or any previous visit (from the past 240 hour(s)).  Procedures and diagnostic studies:  CT ABDOMEN PELVIS W CONTRAST  Result Date: 09/08/2022 CLINICAL DATA:  Worsening abdominal pain. EXAM: CT ABDOMEN AND PELVIS WITH CONTRAST TECHNIQUE: Multidetector CT imaging of the abdomen and pelvis was performed using the standard protocol following bolus administration of intravenous contrast. RADIATION DOSE REDUCTION: This exam was performed according to the departmental dose-optimization program which includes automated exposure control, adjustment of the  mA and/or kV according to patient size and/or use of iterative reconstruction technique. CONTRAST:  OMNIPAQUE IOHEXOL 350 MG/ML SOLN COMPARISON:  Noncontrast CT 02/20/2020. FINDINGS: Lower chest: Breathing motion at the lung bases. No pleural effusion. There are several small lung nodules identified. Example middle lobe series 4, image 17 measuring 5 mm. Other lesions identified are also similar to smaller in size. Distribution is similar to the previous examination. Over 2 years of stability. No specific imaging follow-up. Heart is enlarged. Coronary artery calcifications are seen. Please correlate for other coronary risk factors. Hepatobiliary: No enhancing liver lesion. Patent portal vein. Stones in the nondilated gallbladder. Pancreas: Mild atrophy of the pancreatic parenchyma. Spleen: Normal in size without focal abnormality. Adrenals/Urinary Tract: Stable left adrenal myelolipoma. Right adrenal gland is preserved. No enhancing renal mass or collecting system dilatation. The calcifications seen along each kidney actually could be vascular based on the distribution. No collecting system dilatation. The ureters have normal course and caliber down to the bladder. Preserved contours of the urinary bladder. Stomach/Bowel: No oral contrast. The large  bowel has a normal course and caliber with scattered stool and diverticula. Normal appendix. Small bowel is nondilated. There is nodular wall thickening along the upper stomach and GE junction region. Please correlate for a malignant lesion. Vascular/Lymphatic: Normal caliber aorta and IVC with scattered vascular calcifications. Extensive abnormal lymph node enlargement identified. This includes retrocrural on series 2, image 13 measuring 19 x 14 mm. Gastrohepatic ligament, periceliac region node left of midline on series 2, image 23 measuring 4.0 by 3.5 cm. Porta hepatic node series 2, image 25 measuring 3.4 by 2.4 cm. Left para-aortic lymph node more caudal on series 2, image 35 measuring 2.8 by 2.2 cm. Several other nodes are seen throughout these locations. There is also some mesenteric nodes, including along the greater curve of the stomach. Reproductive: Prostate is unremarkable. Other: Large midline anterior pelvic wall hernia with rectus muscle diastasis. Herniation of small bowel and significant mesenteric fat. Scattered stranding. There are areas of nodularity along the peritoneum anteriorly such as left upper quadrant image 35 of series 2. There is some thickening of the lateral conal fascia on the left side. Please correlate for evidence of peritoneal carcinomatosis. No significant ascites. No free air. Musculoskeletal: Scattered degenerative changes of the spine and pelvis. Transitional lumbosacral segment. Skin thickening along the anterior pelvic wall and stranding. Mild anasarca. IMPRESSION: Diffuse masslike thickening in the area of the GE junction and upper stomach with the extensive abnormal lymph nodes throughout the abdomen including retrocrural, porta hepatis, retroperitoneum and lesser and greater curve of the stomach. In addition there are areas of suggest peritoneal carcinomatosis. Recommend further evaluation. No bowel obstruction. Large midline anterior pelvic wall hernia involving small  bowel and mesenteric fat with rectus muscle diastasis. Gallstones. Critical Value/emergent results were called by telephone at the time of interpretation on 09/08/2022 at 5:40 pm to provider [facility] , who verbally acknowledged these results. Electronically Signed   By: Karen Kays M.D.   On: 09/08/2022 20:52               LOS: 0 days   Jazzlene Huot  [facility]   Pager on [email or web address]. If 7PM-7AM, please contact night-coverage at [email or web address]     09/09/2022, 12:33 PM

## 2022-09-09 NOTE — ED Notes (Signed)
Pt given diet ginger ale as requested;  

## 2022-09-09 NOTE — Consult Note (Signed)
Hematology/Oncology Consult note [phone] [phone]      Patient Care Team: Debera Lat, PA-C as PCP - General (Physician Assistant) Debbe Odea, MD as PCP - Cardiology (Cardiology) Toney Reil, MD as Consulting Physician (Gastroenterology) Toney Reil, MD as Consulting Physician (Gastroenterology) Toney Reil, MD as Consulting Physician (Gastroenterology)   Name of the patient: Jeremiah Vance  [id number]  05-Oct-1970   REASON FOR COSULTATION:  Unintentional weight loss, abnormal CT scan  History of presenting illness-  52 y.o. male with PMH listed at below who presents to ER for evaluation of abdominal pain and vomiting. 09/08/2022, CT abdomen pelvis with contrast showed Diffuse masslike thickening in the area of the GE junction and upper stomach with the extensive abnormal lymph nodes throughout the abdomen including retrocrural, porta hepatis, retroperitoneum and lesser and greater curve of the stomach. In addition there are areas of suggest peritoneal carcinomatosis. Recommend further evaluation.  No bowel obstruction. Large midline anterior pelvic wall hernia involving small bowel and mesenteric fat with rectus muscle diastasis.Gallstones.  Patient has a history of abdominal pain due to hernia.  He has experienced weight loss, partially is intentional since last year. He reports some dysphagia with solid food for couple of months.  Denies any dark stool or blood in the stool. Patient is on anticoagulation with Eliquis for atrial fibrillation.  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 He lives at home with his sister. In the emergency room, he was found to have a hemoglobin of 8.2, MCV 80.3, iron saturation 6, TIBC 356, ferritin 9.   Allergies  Allergen Reactions   Pollen Extract Other (See Comments)   Sulfa Antibiotics     Patient Active Problem  List   Diagnosis Date Noted   Iron deficiency anemia 09/09/2022   Gastric mass 09/09/2022   Paroxysmal atrial fibrillation (HCC) 09/08/2022   Abdominal pain 09/08/2022   Anxiety and depression 07/26/2022   Dysarthria 05/20/2022   Need for diphtheria-tetanus-pertussis (Tdap) vaccine 03/20/2022   Type 2 diabetes mellitus without complication, with long-term current use of insulin (HCC) 01/16/2022   Dyslipidemia 01/16/2022   Chronic diastolic CHF (congestive heart failure) (HCC) 01/16/2022   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    Intracranial hemorrhage (HCC)    HFrEF (heart failure with reduced ejection fraction) (HCC)    Obesity, Class III, BMI 40-49.9 (morbid obesity) (HCC) 05/22/2020   Other cirrhosis of liver (HCC)    Ventral hernia without obstruction or gangrene    Shortness of breath    Neck pain    Prostate cancer screening 01/21/2020   Lymphedema of both lower extremities 10/17/2019   History of stroke 10/17/2019   CKD (chronic kidney disease) stage 3, GFR 30-59 ml/min (HCC) 08/31/2019   Swelling of limb 08/31/2019   DM (diabetes mellitus), type 2 with complications (HCC) 07/08/2019   Vitreous floaters of both eyes 06/03/2018   Moderate obstructive sleep apnea 02/11/2018   Chronic fatigue 12/16/2017   Essential hypertension 12/16/2017   History of small bowel obstruction 10/24/2017   Adjustment disorder with depressed mood 09/30/2017   Stroke (HCC) 09/14/2017   Hyperlipidemia    Gout    Elevated LFTs    Heart failure with preserved ejection fraction (HCC)    Morbid obesity (HCC)    Hypoalbuminemia 07/11/2017     Past Medical History:  Diagnosis Date   Acute ischemic stroke (HCC) 09/06/2017   Acute renal  failure superimposed on stage 3a chronic kidney disease (HCC) 07/08/2019   Acute right-sided weakness    Allergies    Anasarca    Anemia 07/10/2017   Arthritis    CHF (congestive heart failure) (HCC)    "coinsided w/kidney  problems I was having 06/2017"   Chicken pox    CKD (chronic kidney disease) stage 3, GFR 30-59 ml/min (HCC) 06/2017   Depression    Elevated troponin 07/08/2019   High cholesterol    History of cardiomyopathy    LVEF 40 to 45% in April 2019 - subsequently normalized   Hyperbilirubinemia 07/10/2017   Hypertension    Ischemic stroke (HCC)    Small left internal capsule infarct due to lacunar disease   Morbid obesity (HCC)    Normocytic anemia 12/16/2017   Recurrent incisional hernia with incarceration s/p repair 10/22/2017 10/21/2017   Stroke (HCC) 08/2017   "right sided weakness since; getting stronger though" (10/22/2017)   Type 2 diabetes mellitus (HCC)      Past Surgical History:  Procedure Laterality Date   ABDOMINAL HERNIA REPAIR  2008; 10/22/2017   "scope; OPEN REPAIR INCARCERATED VENTRAL HERNIA   HERNIA REPAIR     KNEE ARTHROSCOPY Right 1989   VENTRAL HERNIA REPAIR N/A 10/22/2017   Procedure: OPEN REPAIR INCARCERATED VENTRAL HERNIA;  Surgeon: Glenna Fellows, MD;  Location: [facility] OR;  Service: General;  Laterality: N/A;    Social History   Socioeconomic History   Marital status: Divorced    Spouse name: Not on file   Number of children: Not on file   Years of education: Not on file   Highest education level: Not on file  Occupational History   Not on file  Tobacco Use   Smoking status: Never   Smokeless tobacco: Never  Vaping Use   Vaping Use: Never used  Substance and Sexual Activity   Alcohol use: Not Currently    Comment: occasional beer   Drug use: Never   Sexual activity: Not Currently  Other Topics Concern   Not on file  Social History Narrative   He lives with his sister , [name] and she is his MPOA after his stokes   Social Determinants of Health   Financial Resource Strain: High Risk (03/20/2020)   Overall Financial Resource Strain (CARDIA)    Difficulty of Paying Living Expenses: Hard  Food Insecurity: No Food Insecurity (01/16/2022)   Hunger  Vital Sign    Worried About Running Out of Food in the Last Year: Never true    Ran Out of Food in the Last Year: Never true  Transportation Needs: Unmet Transportation Needs (01/16/2022)   PRAPARE - Administrator, Civil Service (Medical): Yes    Lack of Transportation (Non-Medical): Yes  Physical Activity: Not on file  Stress: Not on file  Social Connections: Not on file  Intimate Partner Violence: Not At Risk (01/16/2022)   Humiliation, Afraid, Rape, and Kick questionnaire    Fear of Current or Ex-Partner: No    Emotionally Abused: No    Physically Abused: No    Sexually Abused: No     Family History  Problem Relation Age of Onset   Diabetes Mother    Stroke Mother    Arthritis Mother    Depression Mother    Heart disease Mother    Hypertension Mother    Learning disabilities Mother    Mental illness Mother    Sleep apnea Mother    Diabetes Father  Heart disease Father    Arthritis Father    Hearing loss Father    Hyperlipidemia Father    Heart attack Father    Hypertension Father    Stroke Father    Diabetes Sister    Depression Sister    Diabetes Sister    Hypertension Sister    Mental illness Sister    Sleep apnea Sister    Diabetes Maternal Grandmother    Heart disease Maternal Grandmother    Depression Maternal Grandmother    Hyperlipidemia Maternal Grandmother    Hypertension Maternal Grandmother    Stroke Maternal Grandmother    Hypertension Maternal Grandfather    Hyperlipidemia Maternal Grandfather    Stroke Maternal Grandfather    Arthritis Paternal Grandmother    Hearing loss Paternal Grandmother    Stroke Paternal Grandfather    Heart disease Paternal Grandfather    Arthritis Paternal Grandfather    Heart attack Paternal Grandfather      Current Facility-Administered Medications:    acetaminophen (TYLENOL) tablet 650 mg, 650 mg, Oral, Q6H PRN **OR** acetaminophen (TYLENOL) suppository 650 mg, 650 mg, Rectal, Q6H PRN, Gertha Calkin, MD   atorvastatin (LIPITOR) tablet 40 mg, 40 mg, Oral, q1800, Gertha Calkin, MD   busPIRone (BUSPAR) tablet 7.5 mg, 7.5 mg, Oral, BID, Irena Cords V, MD, 7.5 mg at 09/09/22 0835   carvedilol (COREG) tablet 12.5 mg, 12.5 mg, Oral, BID WC, Irena Cords V, MD, 12.5 mg at 09/09/22 0834   hydrALAZINE (APRESOLINE) injection 5 mg, 5 mg, Intravenous, Q4H PRN, Gertha Calkin, MD   hydrALAZINE (APRESOLINE) tablet 50 mg, 50 mg, Oral, Q12H, Gertha Calkin, MD, 50 mg at 09/09/22 0946   irbesartan (AVAPRO) tablet 75 mg, 75 mg, Oral, Daily, Irena Cords V, MD, 75 mg at 09/09/22 0946   metoCLOPramide (REGLAN) tablet 5 mg, 5 mg, Oral, TID AC, Vanga, Loel Dubonnet, MD   morphine (PF) 2 MG/ML injection 2 mg, 2 mg, Intravenous, Q2H PRN, Irena Cords V, MD, 2 mg at 09/09/22 0836   ondansetron (ZOFRAN) tablet 4 mg, 4 mg, Oral, Q6H PRN **OR** ondansetron (ZOFRAN) injection 4 mg, 4 mg, Intravenous, Q6H PRN, Allena Katz, Ekta V, MD   sodium chloride flush (NS) 0.9 % injection 3 mL, 3 mL, Intravenous, Q12H, Irena Cords V, MD, 3 mL at 09/08/22 2344   torsemide (DEMADEX) tablet 40 mg, 40 mg, Oral, Daily, Lurene Shadow, MD, 40 mg at 09/09/22 1324  Current Outpatient Medications:    amLODipine (NORVASC) 10 MG tablet, Take 10 mg by mouth daily., Disp: , Rfl:    apixaban (ELIQUIS) 5 MG TABS tablet, Take 1 tablet (5 mg total) by mouth 2 (two) times daily., Disp: 60 tablet, Rfl: 11   atorvastatin (LIPITOR) 40 MG tablet, Take 1 tablet (40 mg total) by mouth daily at 6 PM., Disp: 90 tablet, Rfl: 3   dapagliflozin propanediol (FARXIGA) 10 MG TABS tablet, Take 1 tablet (10 mg) by mouth once daily/please contact office to schedule follow up prior to further refills., Disp: 180 tablet, Rfl: 0   hydrALAZINE (APRESOLINE) 50 MG tablet, Take 1 tablet (50 mg total) by mouth in the morning and at bedtime. please contact office to schedule follow up prior to further refills., Disp: 180 tablet, Rfl: 0   irbesartan (AVAPRO) 300 MG tablet, Take 300  mg by mouth daily., Disp: , Rfl:    torsemide (DEMADEX) 20 MG tablet, Take 2 tablets (40 mg total) by mouth daily., Disp: 90 tablet, Rfl: 3  busPIRone (BUSPAR) 7.5 MG tablet, Take 1 tablet (7.5 mg total) by mouth 2 (two) times daily., Disp: 60 tablet, Rfl: 3   carvedilol (COREG) 12.5 MG tablet, Take 1 tablet (12.5 mg total) by mouth 2 (two) times daily with a meal. (Patient not taking: Reported on 09/09/2022), Disp: 60 tablet, Rfl: 3   Continuous Blood Gluc Sensor (FREESTYLE LIBRE 2 SENSOR) MISC, Place 1 sensor on the skin every 14 days. Use to check glucose continuously, Disp: 2 each, Rfl: 11  Review of Systems  Constitutional:  Positive for fatigue and unexpected weight change. Negative for chills and fever.  HENT:   Negative for hearing loss and voice change.   Eyes:  Negative for eye problems and icterus.  Respiratory:  Negative for chest tightness, cough and shortness of breath.   Cardiovascular:  Positive for leg swelling. Negative for chest pain.  Gastrointestinal:  Positive for abdominal pain and vomiting. Negative for abdominal distention.  Endocrine: Negative for hot flashes.  Genitourinary:  Negative for difficulty urinating, dysuria and frequency.   Musculoskeletal:  Negative for arthralgias.  Skin:  Negative for itching and rash.  Neurological:  Negative for light-headedness and numbness.  Hematological:  Negative for adenopathy. Does not bruise/bleed easily.  Psychiatric/Behavioral:  Negative for confusion.     PHYSICAL EXAM Vitals:   09/09/22 0500 09/09/22 0520 09/09/22 0940 09/09/22 1300  BP: (!) 160/104 (!) 153/95 (!) 146/99 (!) 144/95  Pulse: 77 82 87 86  Resp: 20 20 20 18   Temp:   97.8 F (36.6 C) 97.8 F (36.6 C)  TempSrc:      SpO2: 97% 96% 97% 96%  Weight:      Height:       Physical Exam Constitutional:      General: He is not in acute distress.    Appearance: He is not diaphoretic.  HENT:     Head: Normocephalic and atraumatic.     Nose: Nose normal.      Mouth/Throat:     Pharynx: No oropharyngeal exudate.  Eyes:     General: No scleral icterus.    Pupils: Pupils are equal, round, and reactive to light.  Cardiovascular:     Rate and Rhythm: Normal rate. Rhythm irregular.  Pulmonary:     Effort: Pulmonary effort is normal. No respiratory distress.     Breath sounds: No wheezing.  Abdominal:     General: Bowel sounds are normal. There is no distension.     Palpations: Abdomen is soft.     Comments: large midline anterior pelvic wall hernia  Musculoskeletal:        General: Normal range of motion.     Cervical back: Normal range of motion and neck supple.     Right lower leg: No edema.     Left lower leg: Edema present.  Skin:    General: Skin is warm and dry.     Findings: No erythema.     Comments: Bilateral lower extremity purplish skin changes  Neurological:     Mental Status: He is alert and oriented to person, place, and time.     Cranial Nerves: No cranial nerve deficit.     Motor: No abnormal muscle tone.     Coordination: Coordination normal.  Psychiatric:        Mood and Affect: Mood and affect normal.       LABORATORY STUDIES    Latest Ref Rng & Units 09/09/2022   10:50 AM 09/08/2022    6:51 PM  05/23/2022    4:06 PM  CBC  WBC 4.0 - 10.5 K/uL 7.6  7.9  9.4   Hemoglobin 13.0 - 17.0 g/dL 8.2  9.2  16.1   Hematocrit 39.0 - 52.0 % 27.3  30.2  35.8   Platelets 150 - 400 K/uL 279  311  421       Latest Ref Rng & Units 09/09/2022   10:50 AM 09/08/2022    6:51 PM 05/23/2022    4:06 PM  CMP  Glucose 70 - 99 mg/dL 096  045  409   BUN 6 - 20 mg/dL 18  17  29    Creatinine 0.61 - 1.24 mg/dL 8.11  9.14  7.82   Sodium 135 - 145 mmol/L 130  129  139   Potassium 3.5 - 5.1 mmol/L 4.2  4.2  4.6   Chloride 98 - 111 mmol/L 99  98  100   CO2 22 - 32 mmol/L 21  19  23    Calcium 8.9 - 10.3 mg/dL 8.5  8.6  9.7   Total Protein 6.5 - 8.1 g/dL  8.0  6.9   Total Bilirubin 0.3 - 1.2 mg/dL  1.1  0.6   Alkaline Phos 38 - 126 U/L  71   86   AST 15 - 41 U/L  18  17   ALT 0 - 44 U/L  10  16      RADIOGRAPHIC STUDIES: I have personally reviewed the radiological images as listed and agreed with the findings in the report. CT ABDOMEN PELVIS W CONTRAST  Result Date: 09/08/2022 CLINICAL DATA:  Worsening abdominal pain. EXAM: CT ABDOMEN AND PELVIS WITH CONTRAST TECHNIQUE: Multidetector CT imaging of the abdomen and pelvis was performed using the standard protocol following bolus administration of intravenous contrast. RADIATION DOSE REDUCTION: This exam was performed according to the departmental dose-optimization program which includes automated exposure control, adjustment of the mA and/or kV according to patient size and/or use of iterative reconstruction technique. CONTRAST:  OMNIPAQUE IOHEXOL 350 MG/ML SOLN COMPARISON:  Noncontrast CT 02/20/2020. FINDINGS: Lower chest: Breathing motion at the lung bases. No pleural effusion. There are several small lung nodules identified. Example middle lobe series 4, image 17 measuring 5 mm. Other lesions identified are also similar to smaller in size. Distribution is similar to the previous examination. Over 2 years of stability. No specific imaging follow-up. Heart is enlarged. Coronary artery calcifications are seen. Please correlate for other coronary risk factors. Hepatobiliary: No enhancing liver lesion. Patent portal vein. Stones in the nondilated gallbladder. Pancreas: Mild atrophy of the pancreatic parenchyma. Spleen: Normal in size without focal abnormality. Adrenals/Urinary Tract: Stable left adrenal myelolipoma. Right adrenal gland is preserved. No enhancing renal mass or collecting system dilatation. The calcifications seen along each kidney actually could be vascular based on the distribution. No collecting system dilatation. The ureters have normal course and caliber down to the bladder. Preserved contours of the urinary bladder. Stomach/Bowel: No oral contrast. The large bowel has a  normal course and caliber with scattered stool and diverticula. Normal appendix. Small bowel is nondilated. There is nodular wall thickening along the upper stomach and GE junction region. Please correlate for a malignant lesion. Vascular/Lymphatic: Normal caliber aorta and IVC with scattered vascular calcifications. Extensive abnormal lymph node enlargement identified. This includes retrocrural on series 2, image 13 measuring 19 x 14 mm. Gastrohepatic ligament, periceliac region node left of midline on series 2, image 23 measuring 4.0 by 3.5 cm. Porta hepatic node series 2,  image 25 measuring 3.4 by 2.4 cm. Left para-aortic lymph node more caudal on series 2, image 35 measuring 2.8 by 2.2 cm. Several other nodes are seen throughout these locations. There is also some mesenteric nodes, including along the greater curve of the stomach. Reproductive: Prostate is unremarkable. Other: Large midline anterior pelvic wall hernia with rectus muscle diastasis. Herniation of small bowel and significant mesenteric fat. Scattered stranding. There are areas of nodularity along the peritoneum anteriorly such as left upper quadrant image 35 of series 2. There is some thickening of the lateral conal fascia on the left side. Please correlate for evidence of peritoneal carcinomatosis. No significant ascites. No free air. Musculoskeletal: Scattered degenerative changes of the spine and pelvis. Transitional lumbosacral segment. Skin thickening along the anterior pelvic wall and stranding. Mild anasarca. IMPRESSION: Diffuse masslike thickening in the area of the GE junction and upper stomach with the extensive abnormal lymph nodes throughout the abdomen including retrocrural, porta hepatis, retroperitoneum and lesser and greater curve of the stomach. In addition there are areas of suggest peritoneal carcinomatosis. Recommend further evaluation. No bowel obstruction. Large midline anterior pelvic wall hernia involving small bowel and  mesenteric fat with rectus muscle diastasis. Gallstones. Critical Value/emergent results were called by telephone at the time of interpretation on 09/08/2022 at 5:40 pm to provider [facility] , who verbally acknowledged these results. Electronically Signed   By: Karen Kays M.D.   On: 09/08/2022 20:52     Assessment and plan-   # Abnormal CT scan CT scan images reviewed and discussed with patient. Concerning for GI malignancy.  Masslike thickening of the GE junction and upper stomach with extensive abdomen lymphadenopathy There are areas of peritoneal nodularity.  No bowel obstruction.   I agree with GI consultation and EGD evaluation. I will see patient outpatient for follow-up and discuss gust about the EGD biopsy results. Possible peritoneal carcinomatosis.  Plan outpatient PET scan for evaluation. Check CEA.  # Iron deficiency anemia due to GI blood loss, heme positive stools.  I agree with IV Venofer treatment.  He got 1 dose today.  I will order another dose tomorrow.  He will follow-up outpatient with me for additional IV Venofer treatments. # Atrial fibrillation with history of multiple stroke.  Eliquis was held due to anemia and upcoming EGD.  Thank you for allowing me to participate in the care of this patient.   Rickard Patience, MD, PhD Hematology Oncology 09/09/2022

## 2022-09-09 NOTE — ED Notes (Signed)
Introduced self to patient. Pt resting comfortably at this time. No complaints or request. Family at bedside. MD at bedside at this time.

## 2022-09-09 NOTE — Evaluation (Signed)
Physical Therapy Evaluation Patient Details Name: Jeremiah Vance MRN: [id number] DOB: 01/07/1971 Today's Date: 09/09/2022  History of Present Illness  Jeremiah Vance is an 52 y.o. male with a PMHx of 2 prior strokes, CKD, anemia, Jeremiah Vance is a 52 y.o. male with medical history significant for abdominal pain and has been having vomiting. 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   Admitted for abdominal pain and CT finding consistent for cancer, anemia with guaiac positive stools on Eliquis.    Clinical Impression  Pt in bed in the hallway upon arrival with family member sitting near pt.  Pt agreed to ambulating during session.  Pt performed transfers with increased time, but safe in doing so.  Pt then ambulated with FWW in the ED hallway.  Pt somewhat limited by fatigue and pain in the abdomen.  Pt returned to bed and reports this to be similar to baseline levels, just increased pain in the abdomen.  Pt would benefit from returning to OPPT in order to challenge balance and improve overall mobility at this time.  Patient is at baseline, all education completed, and time is given to address all questions/concerns. No additional skilled PT services needed at this time, PT signing off.  PT recommends daily ambulation ad lib or with nursing staff as needed to prevent deconditioning.         Recommendations for follow up therapy are one component of a multi-disciplinary discharge planning process, led by the attending physician.  Recommendations may be updated based on patient status, additional functional criteria and insurance authorization.     Assistance Recommended at Discharge None  Patient can return home with the following       Equipment Recommendations None recommended by PT  Recommendations for Other Services       Functional Status Assessment Patient has not had a recent  decline in their functional status (limited by abdominal pain at this time.)     Precautions / Restrictions Precautions Precautions: Fall Restrictions Weight Bearing Restrictions: No      Mobility  Bed Mobility Overal bed mobility: Modified Independent             General bed mobility comments: increased time    Transfers Overall transfer level: Needs assistance Equipment used: Rolling walker (2 wheels) Transfers: Sit to/from Stand Sit to Stand: Supervision                Ambulation/Gait Ambulation/Gait assistance: Modified independent (Device/Increase time) Gait Distance (Feet): 80 Feet Assistive device: Rolling walker (2 wheels) Gait Pattern/deviations: WFL(Within Functional Limits) Gait velocity: slightly decreased.     General Gait Details: good technique with mobility  Stairs            Wheelchair Mobility    Modified Rankin (Stroke Patients Only)       Balance Overall balance assessment: No apparent balance deficits (not formally assessed)                                           Pertinent Vitals/Pain Pain Assessment Pain Assessment: Faces Faces Pain Scale: Hurts whole lot Pain Location: abdomen Pain Descriptors / Indicators: Discomfort, Dull, Grimacing, Guarding Pain Intervention(s): Limited activity within patient's tolerance, Monitored during session, Repositioned    Home Living Family/patient expects to be discharged to:: Private  residence Living Arrangements: Other relatives (sister) Available Help at Discharge: Available PRN/intermittently;Family Type of Home: House       Alternate Level Stairs-Number of Steps: flight Home Layout: Two level;Bed/bath upstairs Home Equipment: Shower seat - built Char[employer] fundraiser (2 wheels) Additional Comments: reports can sleep in recliner on 1st floor if needed    Prior Function Prior Level of Function : Independent/Modified Independent;Working/employed;History of  Falls (last six months)             Mobility Comments: MOD I using RW ADLs Comments: sister does IADLs     Hand Dominance   Dominant Hand: Right    Extremity/Trunk Assessment   Upper Extremity Assessment Upper Extremity Assessment: Overall WFL for tasks assessed    Lower Extremity Assessment Lower Extremity Assessment: Generalized weakness       Communication   Communication: No difficulties  Cognition Arousal/Alertness: Awake/alert Behavior During Therapy: WFL for tasks assessed/performed Overall Cognitive Status: Within Functional Limits for tasks assessed                                          General Comments      Exercises     Assessment/Plan    PT Assessment Patient does not need any further PT services  PT Problem List Decreased strength       PT Treatment Interventions      PT Goals (Current goals can be found in the Care Plan section)  Acute Rehab PT Goals Patient Stated Goal: pt to return to OPPT next week. PT Goal Formulation: With patient Time For Goal Achievement: 09/23/22 Potential to Achieve Goals: Good    Frequency       Co-evaluation               AM-PAC PT "6 Clicks" Mobility  Outcome Measure Help needed turning from your back to your side while in a flat bed without using bedrails?: None Help needed moving from lying on your back to sitting on the side of a flat bed without using bedrails?: None Help needed moving to and from a bed to a chair (including a wheelchair)?: None Help needed standing up from a chair using your arms (e.g., wheelchair or bedside chair)?: None Help needed to walk in hospital room?: None Help needed climbing 3-5 steps with a railing? : A Little 6 Click Score: 23    End of Session Equipment Utilized During Treatment: Gait belt Activity Tolerance: Patient tolerated treatment well;Patient limited by pain Patient left: in bed;with call bell/phone within reach;with bed alarm  set;with family/visitor present Nurse Communication: Mobility status PT Visit Diagnosis: Unsteadiness on feet (R26.81);Other abnormalities of gait and mobility (R26.89);Muscle weakness (generalized) (M62.81);Difficulty in walking, not elsewhere classified (R26.2);Pain Pain - Right/Left:  (abdomen)    Time: 1029-1050 PT Time Calculation (min) (ACUTE ONLY): 21 min   Charges:   PT Evaluation $PT Eval Low Complexity: 1 Low PT Treatments $Therapeutic Activity: 8-22 mins         Nolon Bussing, PT, DPT Physical Therapist - [place]  [facility]  09/09/22, 1:50 PM

## 2022-09-09 NOTE — Consult Note (Signed)
SURGICAL CONSULTATION NOTE   HISTORY OF PRESENT ILLNESS (HPI):  52 y.o. male presented to [facility] ED for evaluation of abdominal pain. Patient reports she has been having abdominal pain for the last few days.  He endorses that the pain is in the lower abdomen.  No pain radiation.  He feels that the pain is coming from his hernia.  He cannot identify any alleviating or aggravating factors.  Patient denies any nausea or vomiting.  As per chart review patient had an episode of bowel obstruction in 2019.  He had hernia repair at that time.  He has hernia recurrence.  At the ED he was found with diffuse abdominal pain.  White blood cell count 7.9.  Decreased hemoglobin of 9.2.  He had a CT scan of the abdomen pelvis and he was found with thickening of the GE junction concerning for malignancy.  He has a known hernia without sign of complications.  Surgery is consulted by Dr. Allena Katz in this context for evaluation and management of incisional hernia.  PAST MEDICAL HISTORY (PMH):  Past Medical History:  Diagnosis Date   Acute ischemic stroke (HCC) 09/06/2017   Acute renal failure superimposed on stage 3a chronic kidney disease (HCC) 07/08/2019   Acute right-sided weakness    Allergies    Anasarca    Anemia 07/10/2017   Arthritis    CHF (congestive heart failure) (HCC)    "coinsided w/kidney problems I was having 06/2017"   Chicken pox    CKD (chronic kidney disease) stage 3, GFR 30-59 ml/min (HCC) 06/2017   Depression    Elevated troponin 07/08/2019   High cholesterol    History of cardiomyopathy    LVEF 40 to 45% in April 2019 - subsequently normalized   Hyperbilirubinemia 07/10/2017   Hypertension    Ischemic stroke (HCC)    Small left internal capsule infarct due to lacunar disease   Morbid obesity (HCC)    Normocytic anemia 12/16/2017   Recurrent incisional hernia with incarceration s/p repair 10/22/2017 10/21/2017   Stroke (HCC) 08/2017   "right sided weakness since; getting stronger though"  (10/22/2017)   Type 2 diabetes mellitus (HCC)      PAST SURGICAL HISTORY (PSH):  Past Surgical History:  Procedure Laterality Date   ABDOMINAL HERNIA REPAIR  2008; 10/22/2017   "scope; OPEN REPAIR INCARCERATED VENTRAL HERNIA   HERNIA REPAIR     KNEE ARTHROSCOPY Right 1989   VENTRAL HERNIA REPAIR N/A 10/22/2017   Procedure: OPEN REPAIR INCARCERATED VENTRAL HERNIA;  Surgeon: Glenna Fellows, MD;  Location: [facility] OR;  Service: General;  Laterality: N/A;     MEDICATIONS:  Prior to Admission medications   Medication Sig Start Date End Date Taking? Authorizing Provider  amLODipine (NORVASC) 10 MG tablet Take 10 mg by mouth daily. 06/03/22  Yes [provider]  apixaban (ELIQUIS) 5 MG TABS tablet Take 1 tablet (5 mg total) by mouth 2 (two) times daily. 04/19/22  Yes Hammock, Lavonna Rua, NP  atorvastatin (LIPITOR) 40 MG tablet Take 1 tablet (40 mg total) by mouth daily at 6 PM. 11/26/21  Yes Enedina Finner, MD  busPIRone (BUSPAR) 7.5 MG tablet Take 1 tablet (7.5 mg total) by mouth 2 (two) times daily. 07/24/22  Yes Ostwalt, Edmon Crape, PA-C  dapagliflozin propanediol (FARXIGA) 10 MG TABS tablet Take 1 tablet (10 mg) by mouth once daily/please contact office to schedule follow up prior to further refills. 08/27/22  Yes Agbor-Etang, Arlys John, MD  hydrALAZINE (APRESOLINE) 50 MG tablet Take 1 tablet (50 mg  total) by mouth in the morning and at bedtime. please contact office to schedule follow up prior to further refills. 08/27/22  Yes Agbor-Etang, Arlys John, MD  irbesartan (AVAPRO) 300 MG tablet Take 300 mg by mouth daily.   Yes [provider]  torsemide (DEMADEX) 20 MG tablet Take 2 tablets (40 mg total) by mouth daily. 11/26/21  Yes Enedina Finner, MD  carvedilol (COREG) 12.5 MG tablet Take 1 tablet (12.5 mg total) by mouth 2 (two) times daily with a meal. Patient not taking: Reported on 09/09/2022 11/26/21   Enedina Finner, MD  Continuous Blood Gluc Sensor (FREESTYLE LIBRE 2 SENSOR) MISC Place 1 sensor on the skin  every 14 days. Use to check glucose continuously 05/01/22   Debera Lat, PA-C     ALLERGIES:  Allergies  Allergen Reactions   Pollen Extract Other (See Comments)   Sulfa Antibiotics      SOCIAL HISTORY:  Social History   Socioeconomic History   Marital status: Divorced    Spouse name: Not on file   Number of children: Not on file   Years of education: Not on file   Highest education level: Not on file  Occupational History   Not on file  Tobacco Use   Smoking status: Never   Smokeless tobacco: Never  Vaping Use   Vaping Use: Never used  Substance and Sexual Activity   Alcohol use: Not Currently    Comment: occasional beer   Drug use: Never   Sexual activity: Not Currently  Other Topics Concern   Not on file  Social History Narrative   He lives with his sister , [name] and she is his MPOA after his stokes   Social Determinants of Health   Financial Resource Strain: High Risk (03/20/2020)   Overall Financial Resource Strain (CARDIA)    Difficulty of Paying Living Expenses: Hard  Food Insecurity: No Food Insecurity (01/16/2022)   Hunger Vital Sign    Worried About Running Out of Food in the Last Year: Never true    Ran Out of Food in the Last Year: Never true  Transportation Needs: Unmet Transportation Needs (01/16/2022)   PRAPARE - Administrator, Civil Service (Medical): Yes    Lack of Transportation (Non-Medical): Yes  Physical Activity: Not on file  Stress: Not on file  Social Connections: Not on file  Intimate Partner Violence: Not At Risk (01/16/2022)   Humiliation, Afraid, Rape, and Kick questionnaire    Fear of Current or Ex-Partner: No    Emotionally Abused: No    Physically Abused: No    Sexually Abused: No      FAMILY HISTORY:  Family History  Problem Relation Age of Onset   Diabetes Mother    Stroke Mother    Arthritis Mother    Depression Mother    Heart disease Mother    Hypertension Mother    Learning disabilities  Mother    Mental illness Mother    Sleep apnea Mother    Diabetes Father    Heart disease Father    Arthritis Father    Hearing loss Father    Hyperlipidemia Father    Heart attack Father    Hypertension Father    Stroke Father    Diabetes Sister    Depression Sister    Diabetes Sister    Hypertension Sister    Mental illness Sister    Sleep apnea Sister    Diabetes Maternal Grandmother    Heart disease Maternal Grandmother  Depression Maternal Grandmother    Hyperlipidemia Maternal Grandmother    Hypertension Maternal Grandmother    Stroke Maternal Grandmother    Hypertension Maternal Grandfather    Hyperlipidemia Maternal Grandfather    Stroke Maternal Grandfather    Arthritis Paternal Grandmother    Hearing loss Paternal Grandmother    Stroke Paternal Grandfather    Heart disease Paternal Grandfather    Arthritis Paternal Grandfather    Heart attack Paternal Grandfather      REVIEW OF SYSTEMS:  Constitutional: denies weight loss, fever, chills, or sweats  Eyes: denies any other vision changes, history of eye injury  ENT: denies sore throat, hearing problems  Respiratory: denies shortness of breath, wheezing  Cardiovascular: denies chest pain, palpitations  Gastrointestinal: Positive abdominal pain Genitourinary: denies burning with urination or urinary frequency Musculoskeletal: denies any other joint pains or cramps  Skin: denies any other rashes or skin discolorations  Neurological: denies any other headache, dizziness, weakness  Psychiatric: denies any other depression, anxiety   All other review of systems were negative   VITAL SIGNS:  Temp:  [97 F (36.1 C)-98 F (36.7 C)] 98 F (36.7 C) (06/10 0342) Pulse Rate:  [72-98] 82 (06/10 0520) Resp:  [16-24] 20 (06/10 0520) BP: (153-179)/(82-119) 153/95 (06/10 0520) SpO2:  [87 %-99 %] 96 % (06/10 0520) Weight:  [122 kg] 122 kg (06/09 1845)     Height: 5\' 10"  (177.8 cm) Weight: 122 kg BMI (Calculated):  38.59   INTAKE/OUTPUT:  This shift: No intake/output data recorded.  Last 2 shifts: @IOLAST2SHIFTS @   PHYSICAL EXAM:  Constitutional:  -- Normal body habitus  -- Awake, alert, and oriented x3  Eyes:  -- Pupils equally round and reactive to light  -- No scleral icterus  Ear, nose, and throat:  -- No jugular venous distension  Pulmonary:  -- No crackles  -- Equal breath sounds bilaterally -- Breathing non-labored at rest Cardiovascular:  -- S1, S2 present  -- No pericardial rubs Gastrointestinal:  -- Abdomen soft, nontender, non-distended, no guarding or rebound tenderness.  Ventral hernia, nontender, no skin changes. -- No abdominal masses appreciated, pulsatile or otherwise  Musculoskeletal and Integumentary:  -- Wounds: None appreciated -- Extremities: B/L UE and LE FROM, hands and feet warm, no edema  Neurologic:  -- Motor function: intact and symmetric -- Sensation: intact and symmetric   Labs:     Latest Ref Rng & Units 09/08/2022    6:51 PM 05/23/2022    4:06 PM 05/20/2022    1:52 PM  CBC  WBC 4.0 - 10.5 K/uL 7.9  9.4  9.1   Hemoglobin 13.0 - 17.0 g/dL 9.2  16.1  09.6   Hematocrit 39.0 - 52.0 % 30.2  35.8  35.5   Platelets 150 - 400 K/uL 311  421  350       Latest Ref Rng & Units 09/08/2022    6:51 PM 05/23/2022    4:06 PM 05/20/2022    1:52 PM  CMP  Glucose 70 - 99 mg/dL 045  409  811   BUN 6 - 20 mg/dL 17  29  30    Creatinine 0.61 - 1.24 mg/dL 9.14  7.82  9.56   Sodium 135 - 145 mmol/L 129  139  138   Potassium 3.5 - 5.1 mmol/L 4.2  4.6  5.0   Chloride 98 - 111 mmol/L 98  100  104   CO2 22 - 32 mmol/L 19  23  25    Calcium 8.9 -  10.3 mg/dL 8.6  9.7  8.9   Total Protein 6.5 - 8.1 g/dL 8.0  6.9  7.2   Total Bilirubin 0.3 - 1.2 mg/dL 1.1  0.6  0.7   Alkaline Phos 38 - 126 U/L 71  86  67   AST 15 - 41 U/L 18  17  22    ALT 0 - 44 U/L 10  16  16      Imaging studies:  EXAM: CT ABDOMEN AND PELVIS WITH CONTRAST   TECHNIQUE: Multidetector CT imaging of the  abdomen and pelvis was performed using the standard protocol following bolus administration of intravenous contrast.   RADIATION DOSE REDUCTION: This exam was performed according to the departmental dose-optimization program which includes automated exposure control, adjustment of the mA and/or kV according to patient size and/or use of iterative reconstruction technique.   CONTRAST:  OMNIPAQUE IOHEXOL 350 MG/ML SOLN   COMPARISON:  Noncontrast CT 02/20/2020.   FINDINGS: Lower chest: Breathing motion at the lung bases. No pleural effusion. There are several small lung nodules identified. Example middle lobe series 4, image 17 measuring 5 mm. Other lesions identified are also similar to smaller in size. Distribution is similar to the previous examination. Over 2 years of stability. No specific imaging follow-up. Heart is enlarged. Coronary artery calcifications are seen. Please correlate for other coronary risk factors.   Hepatobiliary: No enhancing liver lesion. Patent portal vein. Stones in the nondilated gallbladder.   Pancreas: Mild atrophy of the pancreatic parenchyma.   Spleen: Normal in size without focal abnormality.   Adrenals/Urinary Tract: Stable left adrenal myelolipoma. Right adrenal gland is preserved. No enhancing renal mass or collecting system dilatation. The calcifications seen along each kidney actually could be vascular based on the distribution. No collecting system dilatation. The ureters have normal course and caliber down to the bladder. Preserved contours of the urinary bladder.   Stomach/Bowel: No oral contrast. The large bowel has a normal course and caliber with scattered stool and diverticula. Normal appendix. Small bowel is nondilated.   There is nodular wall thickening along the upper stomach and GE junction region. Please correlate for a malignant lesion.   Vascular/Lymphatic: Normal caliber aorta and IVC with scattered vascular  calcifications. Extensive abnormal lymph node enlargement identified. This includes retrocrural on series 2, image 13 measuring 19 x 14 mm. Gastrohepatic ligament, periceliac region node left of midline on series 2, image 23 measuring 4.0 by 3.5 cm. Porta hepatic node series 2, image 25 measuring 3.4 by 2.4 cm. Left para-aortic lymph node more caudal on series 2, image 35 measuring 2.8 by 2.2 cm. Several other nodes are seen throughout these locations. There is also some mesenteric nodes, including along the greater curve of the stomach.   Reproductive: Prostate is unremarkable.   Other: Large midline anterior pelvic wall hernia with rectus muscle diastasis. Herniation of small bowel and significant mesenteric fat. Scattered stranding. There are areas of nodularity along the peritoneum anteriorly such as left upper quadrant image 35 of series 2. There is some thickening of the lateral conal fascia on the left side. Please correlate for evidence of peritoneal carcinomatosis. No significant ascites. No free air.   Musculoskeletal: Scattered degenerative changes of the spine and pelvis. Transitional lumbosacral segment. Skin thickening along the anterior pelvic wall and stranding. Mild anasarca.   IMPRESSION: Diffuse masslike thickening in the area of the GE junction and upper stomach with the extensive abnormal lymph nodes throughout the abdomen including retrocrural, porta hepatis, retroperitoneum and lesser  and greater curve of the stomach. In addition there are areas of suggest peritoneal carcinomatosis. Recommend further evaluation.   No bowel obstruction. Large midline anterior pelvic wall hernia involving small bowel and mesenteric fat with rectus muscle diastasis.   Gallstones.   Critical Value/emergent results were called by telephone at the time of interpretation on 09/08/2022 at 5:40 pm to provider [facility] , who verbally acknowledged these results.      Electronically Signed   By: Karen Kays M.D.   On: 09/08/2022 20:52  Assessment/Plan:  52 y.o. 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  Patient was consulted to general surgery due to ventral hernia.  CT scan without any sign of complication from his known chronic ventral hernia.  He has other more significant findings on the CT scan.  I do not recommend any surgical intervention for his hernia.  No contraindication for workup for possible malignancy.  Even if there is no other causes of his pain, no need of origin surgical evaluation for his hernia.  He has a chronic incisional hernia that is not causing any complication.  Also patient obesity and the size and complexity of his hernia he is not a surgical candidate.  If he wants a second opinion eventually for his hernia, he should seek evaluation at hernia center.  Surgical management to reduce admission.  Follow-up with GI and oncology recommendation.   Gae Gallop, MD

## 2022-09-09 NOTE — ED Notes (Signed)
Non skid socks placed on pt. Pt ambulatory to restroom

## 2022-09-09 NOTE — Consult Note (Signed)
Arlyss Repress, MD [address]  [address]  [place], [place] [address]  Main: [phone]  Fax: [phone] Pager: [phone]   Consultation  Referring Provider:     No ref. provider found Primary Care Physician:  Debera Lat, PA-C Primary Gastroenterologist: Gentry Fitz        Reason for Consultation:     Peritoneal carcinomatosis, abnormal CT, abdominal pain  Date of Admission:  09/08/2022 Date of Consultation:  09/09/2022         HPI:   Jeremiah Vance is a 52 y.o. male history of stroke in 10/2021, CHF, stage III CKD, metabolic syndrome.  He presented to ER yesterday secondary to diffuse abdominal pain which has worsened in last 2 days associated with vomiting.  He had history of incarcerated ventral hernia repair in 2019.  He was hemodynamically stable, labs revealed microcytic anemia, has history of A-fib on Eliquis.  He underwent CT abdomen and pelvis with contrast which revealed Diffuse masslike thickening in the area of the GE junction and upper stomach with the extensive abnormal lymph nodes throughout the abdomen including retrocrural, porta hepatis, retroperitoneum and lesser and greater curve of the stomach. In addition there are areas of suggest peritoneal carcinomatosis. Recommend further evaluation.  Patient's aunt is bedside.  NSAIDs: None  Antiplts/Anticoagulants/Anti thrombotics: Eliquis for history of A-fib  GI Procedures: None  Past Medical History:  Diagnosis Date   Acute ischemic stroke (HCC) 09/06/2017   Acute renal failure superimposed on stage 3a chronic kidney disease (HCC) 07/08/2019   Acute right-sided weakness    Allergies    Anasarca    Anemia 07/10/2017   Arthritis    CHF (congestive heart failure) (HCC)    "coinsided w/kidney problems I was having 06/2017"   Chicken pox    CKD (chronic kidney disease) stage 3, GFR 30-59 ml/min (HCC) 06/2017   Depression    Elevated troponin 07/08/2019   High cholesterol    History of  cardiomyopathy    LVEF 40 to 45% in April 2019 - subsequently normalized   Hyperbilirubinemia 07/10/2017   Hypertension    Ischemic stroke (HCC)    Small left internal capsule infarct due to lacunar disease   Morbid obesity (HCC)    Normocytic anemia 12/16/2017   Recurrent incisional hernia with incarceration s/p repair 10/22/2017 10/21/2017   Stroke (HCC) 08/2017   "right sided weakness since; getting stronger though" (10/22/2017)   Type 2 diabetes mellitus (HCC)     Past Surgical History:  Procedure Laterality Date   ABDOMINAL HERNIA REPAIR  2008; 10/22/2017   "scope; OPEN REPAIR INCARCERATED VENTRAL HERNIA   HERNIA REPAIR     KNEE ARTHROSCOPY Right 1989   VENTRAL HERNIA REPAIR N/A 10/22/2017   Procedure: OPEN REPAIR INCARCERATED VENTRAL HERNIA;  Surgeon: Glenna Fellows, MD;  Location: [facility] OR;  Service: General;  Laterality: N/A;     Current Facility-Administered Medications:    acetaminophen (TYLENOL) tablet 650 mg, 650 mg, Oral, Q6H PRN **OR** acetaminophen (TYLENOL) suppository 650 mg, 650 mg, Rectal, Q6H PRN, Gertha Calkin, MD   atorvastatin (LIPITOR) tablet 40 mg, 40 mg, Oral, q1800, Gertha Calkin, MD   busPIRone (BUSPAR) tablet 7.5 mg, 7.5 mg, Oral, BID, Irena Cords V, MD, 7.5 mg at 09/09/22 0835   carvedilol (COREG) tablet 12.5 mg, 12.5 mg, Oral, BID WC, Irena Cords V, MD, 12.5 mg at 09/09/22 0834   hydrALAZINE (APRESOLINE) injection 5 mg, 5 mg, Intravenous, Q4H PRN, Gertha Calkin, MD  hydrALAZINE (APRESOLINE) tablet 50 mg, 50 mg, Oral, Q12H, Gertha Calkin, MD, 50 mg at 09/09/22 0946   irbesartan (AVAPRO) tablet 75 mg, 75 mg, Oral, Daily, Gertha Calkin, MD, 75 mg at 09/09/22 0946   metoCLOPramide (REGLAN) tablet 5 mg, 5 mg, Oral, TID AC, Shayra Anton, Loel Dubonnet, MD   morphine (PF) 2 MG/ML injection 2 mg, 2 mg, Intravenous, Q2H PRN, Gertha Calkin, MD, 2 mg at 09/09/22 0836   ondansetron (ZOFRAN) tablet 4 mg, 4 mg, Oral, Q6H PRN **OR** ondansetron (ZOFRAN) injection 4 mg, 4 mg,  Intravenous, Q6H PRN, Allena Katz, Ekta V, MD   sodium chloride flush (NS) 0.9 % injection 3 mL, 3 mL, Intravenous, Q12H, Irena Cords V, MD, 3 mL at 09/08/22 2344   torsemide (DEMADEX) tablet 40 mg, 40 mg, Oral, Daily, Lurene Shadow, MD, 40 mg at 09/09/22 1324  Current Outpatient Medications:    amLODipine (NORVASC) 10 MG tablet, Take 10 mg by mouth daily., Disp: , Rfl:    apixaban (ELIQUIS) 5 MG TABS tablet, Take 1 tablet (5 mg total) by mouth 2 (two) times daily., Disp: 60 tablet, Rfl: 11   atorvastatin (LIPITOR) 40 MG tablet, Take 1 tablet (40 mg total) by mouth daily at 6 PM., Disp: 90 tablet, Rfl: 3   busPIRone (BUSPAR) 7.5 MG tablet, Take 1 tablet (7.5 mg total) by mouth 2 (two) times daily., Disp: 60 tablet, Rfl: 3   dapagliflozin propanediol (FARXIGA) 10 MG TABS tablet, Take 1 tablet (10 mg) by mouth once daily/please contact office to schedule follow up prior to further refills., Disp: 180 tablet, Rfl: 0   hydrALAZINE (APRESOLINE) 50 MG tablet, Take 1 tablet (50 mg total) by mouth in the morning and at bedtime. please contact office to schedule follow up prior to further refills., Disp: 180 tablet, Rfl: 0   irbesartan (AVAPRO) 300 MG tablet, Take 300 mg by mouth daily., Disp: , Rfl:    torsemide (DEMADEX) 20 MG tablet, Take 2 tablets (40 mg total) by mouth daily., Disp: 90 tablet, Rfl: 3   carvedilol (COREG) 12.5 MG tablet, Take 1 tablet (12.5 mg total) by mouth 2 (two) times daily with a meal. (Patient not taking: Reported on 09/09/2022), Disp: 60 tablet, Rfl: 3   Continuous Blood Gluc Sensor (FREESTYLE LIBRE 2 SENSOR) MISC, Place 1 sensor on the skin every 14 days. Use to check glucose continuously, Disp: 2 each, Rfl: 11   Family History  Problem Relation Age of Onset   Diabetes Mother    Stroke Mother    Arthritis Mother    Depression Mother    Heart disease Mother    Hypertension Mother    Learning disabilities Mother    Mental illness Mother    Sleep apnea Mother    Diabetes  Father    Heart disease Father    Arthritis Father    Hearing loss Father    Hyperlipidemia Father    Heart attack Father    Hypertension Father    Stroke Father    Diabetes Sister    Depression Sister    Diabetes Sister    Hypertension Sister    Mental illness Sister    Sleep apnea Sister    Diabetes Maternal Grandmother    Heart disease Maternal Grandmother    Depression Maternal Grandmother    Hyperlipidemia Maternal Grandmother    Hypertension Maternal Grandmother    Stroke Maternal Grandmother    Hypertension Maternal Grandfather    Hyperlipidemia Maternal Grandfather  Stroke Maternal Grandfather    Arthritis Paternal Grandmother    Hearing loss Paternal Grandmother    Stroke Paternal Grandfather    Heart disease Paternal Grandfather    Arthritis Paternal Grandfather    Heart attack Paternal Grandfather      Social History   Tobacco Use   Smoking status: Never   Smokeless tobacco: Never  Vaping Use   Vaping Use: Never used  Substance Use Topics   Alcohol use: Not Currently    Comment: occasional beer   Drug use: Never    Allergies as of 09/08/2022 - Review Complete 09/08/2022  Allergen Reaction Noted   Pollen extract Other (See Comments) 09/17/2019   Sulfa antibiotics  03/16/2020    Review of Systems:    All systems reviewed and negative except where noted in HPI.   Physical Exam:  Vital signs in last 24 hours: Temp:  [97 F (36.1 C)-98 F (36.7 C)] 97.8 F (36.6 C) (06/10 1300) Pulse Rate:  [72-98] 86 (06/10 1300) Resp:  [16-24] 18 (06/10 1300) BP: (144-179)/(82-119) 144/95 (06/10 1300) SpO2:  [87 %-99 %] 96 % (06/10 1300) Weight:  [161 kg] 122 kg (06/09 1845)   General:   Pleasant, cooperative in NAD Head:  Normocephalic and atraumatic. Eyes:   No icterus.   Conjunctiva pink. PERRLA. Ears:  Normal auditory acuity. Neck:  Supple; no masses or thyroidomegaly Lungs: Respirations even and unlabored. Lungs clear to auscultation bilaterally.    No wheezes, crackles, or rhonchi.  Heart:  Regular rate and rhythm;  Without murmur, clicks, rubs or gallops Abdomen:  Soft, nondistended, mild diffuse tenderness. Normal bowel sounds. No appreciable masses or hepatomegaly.  No rebound or guarding.  Rectal:  Not performed. Msk:  Symmetrical without gross deformities.  Strength generalized weakness Extremities:  Without edema, cyanosis or clubbing. Neurologic:  Alert and oriented x3;  grossly normal neurologically. Skin:  Intact without significant lesions or rashes. Psych:  Alert and cooperative. Normal affect.  LAB RESULTS:    Latest Ref Rng & Units 09/09/2022   10:50 AM 09/08/2022    6:51 PM 05/23/2022    4:06 PM  CBC  WBC 4.0 - 10.5 K/uL 7.6  7.9  9.4   Hemoglobin 13.0 - 17.0 g/dL 8.2  9.2  09.6   Hematocrit 39.0 - 52.0 % 27.3  30.2  35.8   Platelets 150 - 400 K/uL 279  311  421     BMET    Latest Ref Rng & Units 09/09/2022   10:50 AM 09/08/2022    6:51 PM 05/23/2022    4:06 PM  BMP  Glucose 70 - 99 mg/dL 045  409  811   BUN 6 - 20 mg/dL 18  17  29    Creatinine 0.61 - 1.24 mg/dL 9.14  7.82  9.56   BUN/Creat Ratio 9 - 20   16   Sodium 135 - 145 mmol/L 130  129  139   Potassium 3.5 - 5.1 mmol/L 4.2  4.2  4.6   Chloride 98 - 111 mmol/L 99  98  100   CO2 22 - 32 mmol/L 21  19  23    Calcium 8.9 - 10.3 mg/dL 8.5  8.6  9.7     LFT    Latest Ref Rng & Units 09/08/2022    6:51 PM 05/23/2022    4:06 PM 05/20/2022    1:52 PM  Hepatic Function  Total Protein 6.5 - 8.1 g/dL 8.0  6.9  7.2   Albumin  3.5 - 5.0 g/dL 3.6  3.9  3.4   AST 15 - 41 U/L 18  17  22    ALT 0 - 44 U/L 10  16  16    Alk Phosphatase 38 - 126 U/L 71  86  67   Total Bilirubin 0.3 - 1.2 mg/dL 1.1  0.6  0.7      STUDIES: CT ABDOMEN PELVIS W CONTRAST  Result Date: 09/08/2022 CLINICAL DATA:  Worsening abdominal pain. EXAM: CT ABDOMEN AND PELVIS WITH CONTRAST TECHNIQUE: Multidetector CT imaging of the abdomen and pelvis was performed using the standard protocol  following bolus administration of intravenous contrast. RADIATION DOSE REDUCTION: This exam was performed according to the departmental dose-optimization program which includes automated exposure control, adjustment of the mA and/or kV according to patient size and/or use of iterative reconstruction technique. CONTRAST:  OMNIPAQUE IOHEXOL 350 MG/ML SOLN COMPARISON:  Noncontrast CT 02/20/2020. FINDINGS: Lower chest: Breathing motion at the lung bases. No pleural effusion. There are several small lung nodules identified. Example middle lobe series 4, image 17 measuring 5 mm. Other lesions identified are also similar to smaller in size. Distribution is similar to the previous examination. Over 2 years of stability. No specific imaging follow-up. Heart is enlarged. Coronary artery calcifications are seen. Please correlate for other coronary risk factors. Hepatobiliary: No enhancing liver lesion. Patent portal vein. Stones in the nondilated gallbladder. Pancreas: Mild atrophy of the pancreatic parenchyma. Spleen: Normal in size without focal abnormality. Adrenals/Urinary Tract: Stable left adrenal myelolipoma. Right adrenal gland is preserved. No enhancing renal mass or collecting system dilatation. The calcifications seen along each kidney actually could be vascular based on the distribution. No collecting system dilatation. The ureters have normal course and caliber down to the bladder. Preserved contours of the urinary bladder. Stomach/Bowel: No oral contrast. The large bowel has a normal course and caliber with scattered stool and diverticula. Normal appendix. Small bowel is nondilated. There is nodular wall thickening along the upper stomach and GE junction region. Please correlate for a malignant lesion. Vascular/Lymphatic: Normal caliber aorta and IVC with scattered vascular calcifications. Extensive abnormal lymph node enlargement identified. This includes retrocrural on series 2, image 13 measuring 19 x 14  mm. Gastrohepatic ligament, periceliac region node left of midline on series 2, image 23 measuring 4.0 by 3.5 cm. Porta hepatic node series 2, image 25 measuring 3.4 by 2.4 cm. Left para-aortic lymph node more caudal on series 2, image 35 measuring 2.8 by 2.2 cm. Several other nodes are seen throughout these locations. There is also some mesenteric nodes, including along the greater curve of the stomach. Reproductive: Prostate is unremarkable. Other: Large midline anterior pelvic wall hernia with rectus muscle diastasis. Herniation of small bowel and significant mesenteric fat. Scattered stranding. There are areas of nodularity along the peritoneum anteriorly such as left upper quadrant image 35 of series 2. There is some thickening of the lateral conal fascia on the left side. Please correlate for evidence of peritoneal carcinomatosis. No significant ascites. No free air. Musculoskeletal: Scattered degenerative changes of the spine and pelvis. Transitional lumbosacral segment. Skin thickening along the anterior pelvic wall and stranding. Mild anasarca. IMPRESSION: Diffuse masslike thickening in the area of the GE junction and upper stomach with the extensive abnormal lymph nodes throughout the abdomen including retrocrural, porta hepatis, retroperitoneum and lesser and greater curve of the stomach. In addition there are areas of suggest peritoneal carcinomatosis. Recommend further evaluation. No bowel obstruction. Large midline anterior pelvic wall hernia involving small  bowel and mesenteric fat with rectus muscle diastasis. Gallstones. Critical Value/emergent results were called by telephone at the time of interpretation on 09/08/2022 at 5:40 pm to provider [facility] , who verbally acknowledged these results. Electronically Signed   By: Karen Kays M.D.   On: 09/08/2022 20:52      Impression / Plan:   Jeremiah Vance is a 52 y.o. male with metabolic syndrome, history of CVA in 10/2021, A-fib on Eliquis is  admitted with diffuse abdominal pain, worsening last 2 days associated with vomiting and approximately 80 pound weight loss since 10/2021 part of it is intentional and CT abdomen and pelvis with contrast during this admission revealed diffuse masslike thickening at the GE junction and upper stomach with widespread intra-abdominal lymphadenopathy concerning for peritoneal carcinomatosis  Recommend EGD for further evaluation Switch to clear liquid diet due to concern for retained food in the stomach and gastroparesis Recommend to start Reglan 5 mg 3 times daily N.p.o. effective midnight Will plan for diagnostic EGD tomorrow Eliquis has been held during this admission Oncology has been consulted for ?  Peritoneal carcinomatosis  I have discussed alternative options, risks & benefits,  which include, but are not limited to, bleeding, infection, perforation,respiratory complication & drug reaction.  The patient agrees with this plan & written consent will be obtained.     Thank you for involving me in the care of this patient.      LOS: 0 days   Lannette Donath, MD  09/09/2022, 1:49 PM    Note: This dictation was prepared with Dragon dictation along with smaller phrase technology. Any transcriptional errors that result from this process are unintentional.

## 2022-09-09 NOTE — ED Notes (Signed)
Pt titrated to RA.

## 2022-09-10 ENCOUNTER — Observation Stay: Payer: Medicaid Other | Admitting: General Practice

## 2022-09-10 ENCOUNTER — Other Ambulatory Visit: Payer: Self-pay | Admitting: Oncology

## 2022-09-10 ENCOUNTER — Other Ambulatory Visit: Payer: Self-pay

## 2022-09-10 ENCOUNTER — Encounter: Payer: Self-pay | Admitting: Internal Medicine

## 2022-09-10 ENCOUNTER — Encounter
Admission: EM | Disposition: A | Discharge status: Home / Self Care | Payer: Self-pay | Source: Home / Self Care | Attending: Emergency Medicine

## 2022-09-10 DIAGNOSIS — C160 Malignant neoplasm of cardia: Secondary | ICD-10-CM | POA: Diagnosis not present

## 2022-09-10 DIAGNOSIS — C168 Malignant neoplasm of overlapping sites of stomach: Secondary | ICD-10-CM | POA: Diagnosis not present

## 2022-09-10 DIAGNOSIS — R109 Unspecified abdominal pain: Secondary | ICD-10-CM | POA: Diagnosis not present

## 2022-09-10 DIAGNOSIS — K2289 Other specified disease of esophagus: Secondary | ICD-10-CM | POA: Diagnosis not present

## 2022-09-10 DIAGNOSIS — K3189 Other diseases of stomach and duodenum: Secondary | ICD-10-CM | POA: Diagnosis not present

## 2022-09-10 DIAGNOSIS — I480 Paroxysmal atrial fibrillation: Secondary | ICD-10-CM | POA: Diagnosis not present

## 2022-09-10 DIAGNOSIS — D500 Iron deficiency anemia secondary to blood loss (chronic): Secondary | ICD-10-CM | POA: Diagnosis not present

## 2022-09-10 DIAGNOSIS — R9389 Abnormal findings on diagnostic imaging of other specified body structures: Secondary | ICD-10-CM

## 2022-09-10 HISTORY — PX: ESOPHAGOGASTRODUODENOSCOPY (EGD) WITH PROPOFOL: SHX5813

## 2022-09-10 LAB — CBC WITH DIFFERENTIAL/PLATELET
Abs Immature Granulocytes: 0.03 10*3/uL (ref 0.00–0.07)
Basophils Absolute: 0 10*3/uL (ref 0.0–0.1)
Basophils Relative: 1 %
Eosinophils Absolute: 0.2 10*3/uL (ref 0.0–0.5)
Eosinophils Relative: 3 %
HCT: 28.6 % — ABNORMAL LOW (ref 39.0–52.0)
Hemoglobin: 8.4 g/dL — ABNORMAL LOW (ref 13.0–17.0)
Immature Granulocytes: 0 %
Lymphocytes Relative: 15 %
Lymphs Abs: 1.1 10*3/uL (ref 0.7–4.0)
MCH: 24 pg — ABNORMAL LOW (ref 26.0–34.0)
MCHC: 29.4 g/dL — ABNORMAL LOW (ref 30.0–36.0)
MCV: 81.7 fL (ref 80.0–100.0)
Monocytes Absolute: 0.7 10*3/uL (ref 0.1–1.0)
Monocytes Relative: 9 %
Neutro Abs: 5.1 10*3/uL (ref 1.7–7.7)
Neutrophils Relative %: 72 %
Platelets: 228 10*3/uL (ref 150–400)
RBC: 3.5 MIL/uL — ABNORMAL LOW (ref 4.22–5.81)
RDW: 15 % (ref 11.5–15.5)
WBC: 7.1 10*3/uL (ref 4.0–10.5)
nRBC: 0 % (ref 0.0–0.2)

## 2022-09-10 LAB — BASIC METABOLIC PANEL
Anion gap: 12 (ref 5–15)
BUN: 20 mg/dL (ref 6–20)
CO2: 18 mmol/L — ABNORMAL LOW (ref 22–32)
Calcium: 7.7 mg/dL — ABNORMAL LOW (ref 8.9–10.3)
Chloride: 100 mmol/L (ref 98–111)
Creatinine, Ser: 1.55 mg/dL — ABNORMAL HIGH (ref 0.61–1.24)
GFR, Estimated: 54 mL/min — ABNORMAL LOW (ref >60)
Glucose, Bld: 92 mg/dL (ref 70–99)
Potassium: 4 mmol/L (ref 3.5–5.1)
Sodium: 130 mmol/L — ABNORMAL LOW (ref 135–145)

## 2022-09-10 SURGERY — ESOPHAGOGASTRODUODENOSCOPY (EGD) WITH PROPOFOL
Anesthesia: General

## 2022-09-10 MED ORDER — PROPOFOL 10 MG/ML IV BOLUS
INTRAVENOUS | Status: DC | PRN
  Administered 2022-09-10: 60 mg via INTRAVENOUS

## 2022-09-10 MED ORDER — APIXABAN 5 MG PO TABS: 5.0000 mg | ORAL_TABLET | Freq: Two times a day (BID) | ORAL | 11 refills | Status: DC

## 2022-09-10 MED ORDER — PROPOFOL 10 MG/ML IV BOLUS
INTRAVENOUS | Status: AC
  Filled 2022-09-10: qty 40

## 2022-09-10 MED ORDER — LIDOCAINE HCL (PF) 2 % IJ SOLN
INTRAMUSCULAR | Status: AC
  Filled 2022-09-10: qty 5

## 2022-09-10 MED ORDER — PROPOFOL 500 MG/50ML IV EMUL
INTRAVENOUS | Status: DC | PRN
  Administered 2022-09-10: 100 ug/kg/min via INTRAVENOUS

## 2022-09-10 MED ORDER — SODIUM CHLORIDE 0.9 % IV SOLN: INTRAVENOUS | Status: DC

## 2022-09-10 MED ORDER — LIDOCAINE HCL (CARDIAC) PF 100 MG/5ML IV SOSY
PREFILLED_SYRINGE | INTRAVENOUS | Status: DC | PRN
  Administered 2022-09-10: 60 mg via INTRAVENOUS

## 2022-09-10 MED ORDER — OXYCODONE HCL 5 MG PO TABS: 5.0000 mg | ORAL_TABLET | Freq: Three times a day (TID) | ORAL | 0 refills | Status: DC | PRN

## 2022-09-10 NOTE — Discharge Summary (Addendum)
Physician Discharge Summary   Patient: Jeremiah Vance MRN: [id number] DOB: Dec 15, 1970  Admit date:     09/08/2022  Discharge date: 09/10/22  Discharge Physician: Lurene Shadow   PCP: Debera Lat, PA-C   Recommendations at discharge:    Follow up with Dr. Cathie Hoops, oncologist, on 09/19/2022.  Follow-up with PCP in 1 week    Discharge Diagnoses: Principal Problem:   Abdominal pain Active Problems:   Essential hypertension   Moderate obstructive sleep apnea   DM (diabetes mellitus), type 2 with complications (HCC)   CKD (chronic kidney disease) stage 3, GFR 30-59 ml/min (HCC)   History of stroke   Ventral hernia without obstruction or gangrene   Chronic diastolic CHF (congestive heart failure) (HCC)   Paroxysmal atrial fibrillation (HCC)   IDA (iron deficiency anemia)   Gastric mass   Gastrointestinal hemorrhage   GE junction carcinoma (HCC)  Resolved Problems:   * No resolved hospital problems. *  Hospital Course:  Jeremiah Vance is a 52 y.o. 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  He presented to the hospital with abdominal pain and nausea about 2 days duration.     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  Biopsies were performed. Case was discussed with Dr. Allegra Lai, gastroenterologist.  Patient is okay for discharge from her standpoint, and  she recommended full liquid diet at discharge. Patient will follow-up with Dr. Cathie Hoops, oncologist, on 09/19/2022.     Incisional abdominal hernia: He has been evaluated by general surgeon, Dr. Hazle Quant.  No indication for surgery at this time.  Outpatient follow-up recommended.     Iron deficiency anemia with positive heme stools: Hemoglobin dropped from 9.2-8.2.  Hemoglobin was 11.6 on 05/23/2022.  Iron 20, TIBC 356, saturation ratio 6 7 ferritin 9.  S/p IV iron sucrose infusion on 09/09/2022 on 09/10/2022.  Follow-up with oncologist as an outpatient.     Chronic diastolic CHF: Compensated     Paroxysmal atrial fibrillation, CAD, history of stroke: Resume Eliquis on 09/11/2022.       Other comorbidities include type II DM, OSA, hypertension     Plan of care was discussed with patient and Foye Clock, RN, at the bedside.  They verbalized understanding and agree with the plan. [name], endoscopy nurse, was at the bedside during this discussion.      Pain control - [employer] Controlled Substance Reporting System database was reviewed. and patient was instructed, not to drive, operate heavy machinery, perform activities at heights, swimming or participation in water activities or provide baby-sitting services while on Pain, Sleep and Anxiety Medications; until their outpatient Physician has advised to do so again. Also recommended to not to take more than prescribed Pain, Sleep and Anxiety Medications.  Consultants: Gastroenterologist, oncologist, general surgeon Procedures performed: EGD Disposition: Home Diet recommendation:  Discharge Diet Orders (From admission, onward)     Start     Ordered   09/10/22 0000  Diet full liquid        09/10/22 1459  Full liquid diet DISCHARGE MEDICATION: Allergies as of 09/10/2022       Reactions   Pollen Extract Other (See Comments)   Sulfa Antibiotics         Medication List     STOP taking these medications    carvedilol  12.5 MG tablet Commonly known as: COREG       TAKE these medications    amLODipine 10 MG tablet Commonly known as: NORVASC Take 10 mg by mouth daily.   apixaban 5 MG Tabs tablet Commonly known as: ELIQUIS Take 1 tablet (5 mg total) by mouth 2 (two) times daily. Start taking on: September 11, 2022   atorvastatin 40 MG tablet Commonly known as: LIPITOR Take 1 tablet (40 mg total) by mouth daily at 6 PM.   busPIRone 7.5 MG tablet Commonly known as: BUSPAR Take 1 tablet (7.5 mg total) by mouth 2 (two) times daily.   dapagliflozin propanediol 10 MG Tabs tablet Commonly known as: Farxiga Take 1 tablet (10 mg) by mouth once daily/please contact office to schedule follow up prior to further refills.   FreeStyle Libre 2 Sensor Misc Place 1 sensor on the skin every 14 days. Use to check glucose continuously   hydrALAZINE 50 MG tablet Commonly known as: APRESOLINE Take 1 tablet (50 mg total) by mouth in the morning and at bedtime. please contact office to schedule follow up prior to further refills.   irbesartan 300 MG tablet Commonly known as: AVAPRO Take 300 mg by mouth daily.   oxyCODONE 5 MG immediate release tablet Commonly known as: Roxicodone Take 1 tablet (5 mg total) by mouth every 8 (eight) hours as needed for moderate pain.   torsemide 20 MG tablet Commonly known as: DEMADEX Take 2 tablets (40 mg total) by mouth daily.        Discharge Exam: Filed Weights   09/08/22 1845  Weight: 122 kg   GEN: NAD SKIN: Warm and dry EYES: No pallor or icterus ENT: MMM CV: RRR PULM: CTA B ABD: soft, bulging mid abdominal hernia, mild midabdominal tenderness, +BS CNS: AAO x 3, non focal EXT: Dog scratches on bilateral legs, chronic erythematous changes, bilateral legs, no edema or tenderness   Condition at discharge: good  The results of significant diagnostics from this hospitalization (including imaging, microbiology, ancillary and laboratory) are listed below for  reference.   Imaging Studies: CT ABDOMEN PELVIS W CONTRAST  Result Date: 09/08/2022 CLINICAL DATA:  Worsening abdominal pain. EXAM: CT ABDOMEN AND PELVIS WITH CONTRAST TECHNIQUE: Multidetector CT imaging of the abdomen and pelvis was performed using the standard protocol following bolus administration of intravenous contrast. RADIATION DOSE REDUCTION: This exam was performed according to the departmental dose-optimization program which includes automated exposure control, adjustment of the mA and/or kV according to patient size and/or use of iterative reconstruction technique. CONTRAST:  OMNIPAQUE IOHEXOL 350 MG/ML SOLN COMPARISON:  Noncontrast CT 02/20/2020. FINDINGS: Lower chest: Breathing motion at the lung bases. No pleural effusion. There are several small lung nodules identified. Example middle lobe series 4, image 17 measuring 5 mm. Other lesions identified are also similar to smaller in size. Distribution is similar to the previous examination. Over 2 years of stability. No specific imaging follow-up. Heart is enlarged. Coronary artery calcifications are seen. Please correlate for other coronary risk factors. Hepatobiliary: No enhancing liver lesion. Patent portal vein. Stones in the nondilated gallbladder. Pancreas: Mild atrophy of the pancreatic parenchyma. Spleen: Normal in size without focal abnormality. Adrenals/Urinary Tract: Stable left adrenal  myelolipoma. Right adrenal gland is preserved. No enhancing renal mass or collecting system dilatation. The calcifications seen along each kidney actually could be vascular based on the distribution. No collecting system dilatation. The ureters have normal course and caliber down to the bladder. Preserved contours of the urinary bladder. Stomach/Bowel: No oral contrast. The large bowel has a normal course and caliber with scattered stool and diverticula. Normal appendix. Small bowel is nondilated. There is nodular wall thickening along the upper  stomach and GE junction region. Please correlate for a malignant lesion. Vascular/Lymphatic: Normal caliber aorta and IVC with scattered vascular calcifications. Extensive abnormal lymph node enlargement identified. This includes retrocrural on series 2, image 13 measuring 19 x 14 mm. Gastrohepatic ligament, periceliac region node left of midline on series 2, image 23 measuring 4.0 by 3.5 cm. Porta hepatic node series 2, image 25 measuring 3.4 by 2.4 cm. Left para-aortic lymph node more caudal on series 2, image 35 measuring 2.8 by 2.2 cm. Several other nodes are seen throughout these locations. There is also some mesenteric nodes, including along the greater curve of the stomach. Reproductive: Prostate is unremarkable. Other: Large midline anterior pelvic wall hernia with rectus muscle diastasis. Herniation of small bowel and significant mesenteric fat. Scattered stranding. There are areas of nodularity along the peritoneum anteriorly such as left upper quadrant image 35 of series 2. There is some thickening of the lateral conal fascia on the left side. Please correlate for evidence of peritoneal carcinomatosis. No significant ascites. No free air. Musculoskeletal: Scattered degenerative changes of the spine and pelvis. Transitional lumbosacral segment. Skin thickening along the anterior pelvic wall and stranding. Mild anasarca. IMPRESSION: Diffuse masslike thickening in the area of the GE junction and upper stomach with the extensive abnormal lymph nodes throughout the abdomen including retrocrural, porta hepatis, retroperitoneum and lesser and greater curve of the stomach. In addition there are areas of suggest peritoneal carcinomatosis. Recommend further evaluation. No bowel obstruction. Large midline anterior pelvic wall hernia involving small bowel and mesenteric fat with rectus muscle diastasis. Gallstones. Critical Value/emergent results were called by telephone at the time of interpretation on 09/08/2022 at  5:40 pm to provider [facility] , who verbally acknowledged these results. Electronically Signed   By: Karen Kays M.D.   On: 09/08/2022 20:52    Microbiology: Results for orders placed or performed during the hospital encounter of 11/22/21  SARS Coronavirus 2 by RT PCR (hospital order, performed in [facility] hospital lab) *cepheid single result test* Anterior Nasal Swab     Status: None   Collection Time: 11/22/21 10:50 AM   Specimen: Anterior Nasal Swab  Result Value Ref Range Status   SARS Coronavirus 2 by RT PCR NEGATIVE NEGATIVE Final    Comment: (NOTE) SARS-CoV-2 target nucleic acids are NOT DETECTED.  The SARS-CoV-2 RNA is generally detectable in upper and lower respiratory specimens during the acute phase of infection. The lowest concentration of SARS-CoV-2 viral copies this assay can detect is 250 copies / mL. A negative result does not preclude SARS-CoV-2 infection and should not be used as the sole basis for treatment or other patient management decisions.  A negative result may occur with improper specimen collection / handling, submission of specimen other than nasopharyngeal swab, presence of viral mutation(s) within the areas targeted by this assay, and inadequate number of viral copies (<250 copies / mL). A negative result must be combined with clinical observations, patient history, and epidemiological information.  Fact Sheet for Patients:   RoadLapTop.co.za  Fact Sheet for Healthcare Providers: http://kim-miller.com/  This test is not yet approved or  cleared by the [place] FDA and has been authorized for detection and/or diagnosis of SARS-CoV-2 by FDA under an Emergency Use Authorization (EUA).  This EUA will remain in effect (meaning this test can be used) for the duration of the COVID-19 declaration under Section 564(b)(1) of the Act, 21 U.S.C. section 360bbb-3(b)(1), unless the authorization is terminated  or revoked sooner.  Performed at [facility], [address] [place], [place] [address]   MRSA Next Gen by PCR, Nasal     Status: None   Collection Time: 11/22/21  2:01 PM   Specimen: Nasal Mucosa; Nasal Swab  Result Value Ref Range Status   MRSA by PCR Next Gen NOT DETECTED NOT DETECTED Final    Comment: (NOTE) The GeneXpert MRSA Assay (FDA approved for NASAL specimens only), is one component of a comprehensive MRSA colonization surveillance program. It is not intended to diagnose MRSA infection nor to guide or monitor treatment for MRSA infections. Test performance is not FDA approved in patients less than 39 years old. Performed at [facility], [address] [place], [place] [address]     Labs: CBC: Recent Labs  Lab 09/08/22 1851 09/09/22 1050 09/10/22 0541  WBC 7.9 7.6 7.1  NEUTROABS  --   --  5.1  HGB 9.2* 8.2* 8.4*  HCT 30.2* 27.3* 28.6*  MCV 79.5* 80.3 81.7  PLT 311 279 228   Basic Metabolic Panel: Recent Labs  Lab 09/08/22 1851 09/09/22 1050 09/10/22 0541  NA 129* 130* 130*  K 4.2 4.2 4.0  CL 98 99 100  CO2 19* 21* 18*  GLUCOSE 123* 130* 92  BUN 17 18 20   CREATININE 1.28* 1.40* 1.55*  CALCIUM 8.6* 8.5* 7.7*   Liver Function Tests: Recent Labs  Lab 09/08/22 1851  AST 18  ALT 10  ALKPHOS 71  BILITOT 1.1  PROT 8.0  ALBUMIN 3.6   CBG: No results for input(s): "GLUCAP" in the last 168 hours.  Discharge time spent: greater than 30 minutes.  Signed: Lurene Shadow, MD [facility] 09/10/2022

## 2022-09-10 NOTE — Transfer of Care (Signed)
Immediate Anesthesia Transfer of Care Note  Patient: Jeremiah Vance  Procedure(s) Performed: ESOPHAGOGASTRODUODENOSCOPY (EGD) WITH PROPOFOL  Patient Location: PACU  Anesthesia Type:General  Level of Consciousness: drowsy  Airway & Oxygen Therapy: Patient Spontanous Breathing and Patient connected to nasal cannula oxygen  Post-op Assessment: Report given to RN and Post -op Vital signs reviewed and stable  Post vital signs: stable  Last Vitals:  Vitals Value Taken Time  BP 96/61 09/10/22 1241  Temp 36.4 C 09/10/22 1240  Pulse 79 09/10/22 1242  Resp 32 09/10/22 1242  SpO2 94 % 09/10/22 1242  Vitals shown include unvalidated device data.  Last Pain:  Vitals:   09/10/22 1240  TempSrc: Temporal  PainSc: Asleep         Complications: No notable events documented.

## 2022-09-10 NOTE — OR Nursing (Signed)
Patient was assisted to stand up and ambulate to the bathroom.  Patient is able to ambulate independently and without oxygen.  Patient states that he would feel better with the oxygen via nasal canula, even thought O2 sats are at 96% after returning from the restroom.  Patient placed on 2L via Pace.  Patient was also provided with diet ginger ale per request and is not showing any signs of difficulty swallowing at this time.

## 2022-09-10 NOTE — Op Note (Signed)
[facility] Gastroenterology Patient Name: Jeremiah Vance Procedure Date: 09/10/2022 11:54 AM MRN: [id number] Account #: [id number] Date of Birth: December 26, 1970 Admit Type: Outpatient Age: 52 Room: [facility] ENDO ROOM 2 Gender: Male Note Status: Finalized Instrument Name: Laurette Schimke [id number] Procedure:             Upper GI endoscopy Indications:           Generalized abdominal pain, Esophageal dysphagia,                         Abnormal CT of the GI tract, Weight loss Providers:             Toney Reil MD, MD Referring MD:          Debera Lat (Referring MD) Medicines:             General Anesthesia Complications:         No immediate complications. Estimated blood loss:                         Minimal. Procedure:             Pre-Anesthesia Assessment:                        - Prior to the procedure, a History and Physical was                         performed, and patient medications and allergies were                         reviewed. The patient is competent. The risks and                         benefits of the procedure and the sedation options and                         risks were discussed with the patient. All questions                         were answered and informed consent was obtained.                         Patient identification and proposed procedure were                         verified by the physician, the nurse, the                         anesthesiologist, the anesthetist and the technician                         in the pre-procedure area in the procedure room in the                         endoscopy suite. Mental Status Examination: alert and                         oriented. Airway Examination: normal oropharyngeal  airway and neck mobility. Respiratory Examination:                         clear to auscultation. CV Examination: normal.                         Prophylactic Antibiotics: The patient does not  require                         prophylactic antibiotics. Prior Anticoagulants: The                         patient has taken Eliquis (apixaban), last dose was 2                         days prior to procedure. ASA Grade Assessment: III - A                         patient with severe systemic disease. After reviewing                         the risks and benefits, the patient was deemed in                         satisfactory condition to undergo the procedure. The                         anesthesia plan was to use general anesthesia.                         Immediately prior to administration of medications,                         the patient was re-assessed for adequacy to receive                         sedatives. The heart rate, respiratory rate, oxygen                         saturations, blood pressure, adequacy of pulmonary                         ventilation, and response to care were monitored                         throughout the procedure. The physical status of the                         patient was re-assessed after the procedure.                        After obtaining informed consent, the endoscope was                         passed under direct vision. Throughout the procedure,                         the patient's blood pressure, pulse, and oxygen  saturations were monitored continuously. The Endoscope                         was introduced through the mouth, and advanced to the                         third part of duodenum. The upper GI endoscopy was                         accomplished without difficulty. The patient tolerated                         the procedure well. Findings:      Pediatric endoscope was used to traverse the GE junction mass      A large, fungating and submucosal mass with no bleeding and stigmata of       recent bleeding was found at the gastroesophageal junction. The mass was       partially obstructing and partially  circumferential (involving two       thirds of the lumen circumference). Biopsies were taken with a cold       forceps for histology.      A large, fungating, partially circumferential (involving two-thirds of       the lumen circumference) mass with oozing bleeding and stigmata of       recent bleeding was found at the gastroesophageal junction and in the       cardia on retroflexed view. Biopsies were taken with a cold forceps for       histology.      The gastric body, incisura and gastric antrum were normal. Biopsies were       taken with a cold forceps for histology.      Localized nodular mucosa was found in the second portion of the       duodenum. Biopsies were taken with a cold forceps for histology. Impression:            - Partially obstructing, malignant esophageal tumor                         was found at the gastroesophageal junction. Biopsied.                        - Malignant gastric tumor at the gastroesophageal                         junction and in the cardia. Biopsied.                        - Normal gastric body, incisura and antrum. Biopsied.                        - Nodular mucosa in the second portion of the                         duodenum. Biopsied. Recommendation:        - Await pathology results.                        - Return patient to hospital ward for ongoing care.                        -  Full liquid diet indefinitely.                        - Continue present medications.                        - Refer to an oncologist.                        - The findings and recommendations were discussed with                         the patient.                        - The findings and recommendations were discussed with                         the patient's family. Procedure Code(s):     --- Professional ---                        [phone], Esophagogastroduodenoscopy, flexible,                         transoral; with biopsy, single or multiple Diagnosis Code(s):      --- Professional ---                        C16.0, Malignant neoplasm of cardia                        K31.89, Other diseases of stomach and duodenum                        R13.14, Dysphagia, pharyngoesophageal phase                        R10.84, Generalized abdominal pain                        R63.4, Abnormal weight loss                        R93.3, Abnormal findings on diagnostic imaging of                         other parts of digestive tract CPT copyright 2022 American [employer] Association. All rights reserved. The codes documented in this report are preliminary and upon coder review may  be revised to meet current compliance requirements. Dr. Libby Maw Toney Reil MD, MD 09/10/2022 12:41:51 PM This report has been signed electronically. Number of Addenda: 0 Note Initiated On: 09/10/2022 11:54 AM Estimated Blood Loss:  Estimated blood loss: none.      [facility]

## 2022-09-10 NOTE — Anesthesia Preprocedure Evaluation (Signed)
Anesthesia Evaluation  Patient identified by MRN, date of birth, ID band Patient awake    Reviewed: Allergy & Precautions, H&P , NPO status , Patient's Chart, lab work & pertinent test results  Airway Mallampati: III  TM Distance: >3 FB Neck ROM: full    Dental no notable dental hx.    Pulmonary shortness of breath and with exertion, sleep apnea    Pulmonary exam normal        Cardiovascular Exercise Tolerance: Poor hypertension, +CHF  Normal cardiovascular exam  ECHO 2023: 1. Left ventricular ejection fraction, by estimation, is 30 to 35%. The  left ventricle has moderately decreased function. The left ventricle  demonstrates global hypokinesis. There is severe left ventricular  hypertrophy. Left ventricular diastolic  parameters are consistent with Grade I diastolic dysfunction (impaired  relaxation).   2. Right ventricular systolic function is mildly reduced. The right  ventricular size is mildly enlarged. There is normal pulmonary artery  systolic pressure. The estimated right ventricular systolic pressure is  22.7 mmHg.   3. Left atrial size was moderately dilated.   4. The mitral valve is normal in structure. No evidence of mitral valve  regurgitation. No evidence of mitral stenosis.   5. The aortic valve is normal in structure. Aortic valve regurgitation is  not visualized. Aortic valve sclerosis is present, with no evidence of  aortic valve stenosis.   6. There is mild dilatation of the aortic root, measuring 42 mm.   7. The inferior vena cava is normal in size with greater than 50%  respiratory variability, suggesting right atrial pressure of 3 mmHg.   8. Challenging images     Neuro/Psych  PSYCHIATRIC DISORDERS      CVA (left hand numbness, August 2023), Residual Symptoms    GI/Hepatic negative GI ROS, Neg liver ROS,,,  Endo/Other  negative endocrine ROSdiabetes, Type 2    Renal/GU Renal InsufficiencyRenal  disease  negative genitourinary   Musculoskeletal   Abdominal  (+) + obese  Peds  Hematology  (+) Blood dyscrasia, anemia   Anesthesia Other Findings 09/08/2022, CT abdomen pelvis with contrast showed Diffuse masslike thickening in the area of the GE junction and upper stomach with the extensive abnormal lymph nodes throughout the abdomen including retrocrural, porta hepatis, retroperitoneum and lesser and greater curve of the stomach. In addition there are areas of suggest peritoneal carcinomatosis.    Hyponatremia  Past Medical History: 09/06/2017: Acute ischemic stroke (HCC) 07/08/2019: Acute renal failure superimposed on stage 3a chronic kidney  disease (HCC) No date: Acute right-sided weakness No date: Allergies No date: Anasarca 07/10/2017: Anemia No date: Arthritis No date: CHF (congestive heart failure) (HCC)     Comment:  "coinsided w/kidney problems I was having 06/2017" No date: Chicken pox 06/2017: CKD (chronic kidney disease) stage 3, GFR 30-59 ml/min (HCC) No date: Depression 07/08/2019: Elevated troponin No date: High cholesterol No date: History of cardiomyopathy     Comment:  LVEF 40 to 45% in April 2019 - subsequently normalized 07/10/2017: Hyperbilirubinemia No date: Hypertension No date: Ischemic stroke (HCC)     Comment:  Small left internal capsule infarct due to lacunar               disease No date: Morbid obesity (HCC) 12/16/2017: Normocytic anemia 10/21/2017: Recurrent incisional hernia with incarceration s/p repair  10/22/2017 08/2017: Stroke [facility])     Comment:  "right sided weakness since; getting stronger though"               (  10/22/2017) No date: Type 2 diabetes mellitus [facility])  Past Surgical History: 2008; 10/22/2017: ABDOMINAL HERNIA REPAIR     Comment:  "scope; OPEN REPAIR INCARCERATED VENTRAL HERNIA No date: HERNIA REPAIR 1989: KNEE ARTHROSCOPY; Right 10/22/2017: VENTRAL HERNIA REPAIR; N/A     Comment:  Procedure: OPEN REPAIR INCARCERATED  VENTRAL HERNIA;                Surgeon: Glenna Fellows, MD;  Location: [facility] OR;                Service: General;  Laterality: N/A;  BMI    Body Mass Index: 38.59 kg/m      Reproductive/Obstetrics negative OB ROS                              Anesthesia Physical Anesthesia Plan  ASA: 3  Anesthesia Plan: General   Post-op Pain Management:    Induction: Intravenous  PONV Risk Score and Plan: Propofol infusion and TIVA  Airway Management Planned: Natural Airway  Additional Equipment:   Intra-op Plan:   Post-operative Plan:   Informed Consent: I have reviewed the patients History and Physical, chart, labs and discussed the procedure including the risks, benefits and alternatives for the proposed anesthesia with the patient or authorized representative who has indicated his/her understanding and acceptance.     Dental Advisory Given  Plan Discussed with: CRNA and Surgeon  Anesthesia Plan Comments:          Anesthesia Quick Evaluation

## 2022-09-11 ENCOUNTER — Encounter: Payer: Self-pay | Admitting: Gastroenterology

## 2022-09-11 ENCOUNTER — Encounter: Payer: Self-pay | Admitting: Oncology

## 2022-09-11 ENCOUNTER — Ambulatory Visit (INDEPENDENT_AMBULATORY_CARE_PROVIDER_SITE_OTHER): Payer: Medicaid Other | Admitting: Neurology

## 2022-09-11 ENCOUNTER — Telehealth: Payer: Self-pay

## 2022-09-11 VITALS — BP 126/70 | HR 80 | Ht 70.0 in | Wt 248.0 lb

## 2022-09-11 DIAGNOSIS — R531 Weakness: Secondary | ICD-10-CM | POA: Diagnosis not present

## 2022-09-11 DIAGNOSIS — Z8673 Personal history of transient ischemic attack (TIA), and cerebral infarction without residual deficits: Secondary | ICD-10-CM | POA: Diagnosis not present

## 2022-09-11 DIAGNOSIS — I480 Paroxysmal atrial fibrillation: Secondary | ICD-10-CM

## 2022-09-11 NOTE — Patient Instructions (Signed)
:  I had a long d/w patient and his sister about his recent strokes and  diagnosis of paroxysmal A-fib, risk for recurrent stroke/TIAs, personally independently reviewed imaging studies and stroke evaluation results and answered questions.Continue Eliquis 5 mg twice daily  for secondary stroke prevention and maintain strict control of hypertension with blood pressure goal below 130/90, diabetes with hemoglobin A1c goal below 6.5% and lipids with LDL cholesterol goal below 70 mg/dL. I also advised the patient to eat a healthy diet with plenty of whole grains, cereals, fruits and vegetables, exercise regularly and maintain ideal body weight .  Continue ongoing: Occupational and physical therapy.   He was advised to use his walker while walking outdoors and long distances..  Followup in the future with me in 1 year or call earlier if necessary

## 2022-09-11 NOTE — Anesthesia Postprocedure Evaluation (Signed)
Anesthesia Post Note  Patient: Mahonri D Achenbach  Procedure(s) Performed: ESOPHAGOGASTRODUODENOSCOPY (EGD) WITH PROPOFOL  Patient location during evaluation: PACU Anesthesia Type: General Level of consciousness: awake and alert Pain management: pain level controlled Vital Signs Assessment: post-procedure vital signs reviewed and stable Respiratory status: spontaneous breathing, nonlabored ventilation and respiratory function stable Cardiovascular status: blood pressure returned to baseline and stable Postop Assessment: no apparent nausea or vomiting Anesthetic complications: no   No notable events documented.   Last Vitals:  Vitals:   09/10/22 1420 09/10/22 1500  BP: (!) 99/51 124/75  Pulse: 80 83  Resp: (!) 23 16  Temp:    SpO2: 100% 100%    Last Pain:  Vitals:   09/10/22 1420  TempSrc:   PainSc: 0-No pain                 Foye Deer

## 2022-09-11 NOTE — Progress Notes (Signed)
[facility] [address] [place]. [place] [address] [phone]       OFFICE FOLLOW-UP VISIT NOTE  Mr. WADDELL SCHLESSER Date of Birth:  [birth year] Medical Record Number:  [id number]   Referring MD: Thad Ranger   Reason for Referral: Stroke  HPI: Initial visit 02/27/2022 Mr. Skipton is a 52 year old Caucasian male seen today for initial office consultation visit for intracerebral hemorrhage and lacunar strokes.  History is obtained from the patient and review of electronic medical records and I personally reviewed pertinent available imaging films in PACS.Jamee D Checchi is a 52 y.o. male with a hx of medication noncompliance, HTN, morbid obesity, chronic systolic CHF, HFimpEF, poorly controlled diabetes Type 2, stroke x3, ICH August 2023, OSA noncompliant with CPAP, chronic lower extremity edema, and CKD stage III Admitted in August 2023 for headache, dizziness, and slurred speech. CT head showed small acute hemorrhage. BP in the ER was high and he was started on nicardipine infusion with improvement of BP. He was transitioned to irbesartan, Coreg and Hydralazine. UDS positive for tricyclic antidepressant. ASA was started at d/c. Echo showed LVEF 30-35%, G1DD, He was diuresed and sent home on torsemide 40mg  daily. LHC was not performed during admission due to CKD and small ICH.  He went to the ED 11/29/21 for chest pain. Vitals were normal. HS troponin was minimally elevated to 44. Ddimer was elevated, CT chest was negative. He was offered admission, but declined.   Seen 12/17/21 and reported dizziness. BP was low and Hydralazine was stopped. For reduced EF, a Myoview Lexsican was ordered.Follow-up labs showed worsening Scr/BUN and irbesartan was stopped, and hydralazine was restarted.  Myoview Lexiscan showed normal perfusion without significant ischemia or scar, normal LVEF, minimal coronary artery calcification, overall low risk.  He was admitted on 01/16/2022 for  CVA.presented to the ER for evaluation after his family noted him to be having speech difficulties somewhere around 4 PM yesterday. No clear last known well was discernible-Per the ER notes, it was 6:30 AM on 01/15/2022 presumably his family was trying to reach him but he would not respond and when they checked on him, he appeared confused and was unable to talk normally. He also reports worsening of the left arm weakness which has been present since the last stroke in August . MRI of the brain showed new acute ischemic infarcts up to 6.6 cm each involving the subcortical left frontal and posterior right frontal lobe, no associated hemorrhage or mass effect.  Multiple scattered chronic hemorrhages distribution most consistent with chronic poorly controlled hypertension.  Neurology was consulted.  MRA of the head showed no emergent LVO, posterior circulation atherosclerosis with severe left P1 and P3 stenosis compared to prior CTA, no aneurysm.  Carotid ultrasound showed no significant stenosis in the anterior circulation, antegrade vertebral flow bilaterally.Echo 01/16/22 showed LVEF 55-60%, no WMA, mild LVH, G1DD, normal RVSF, mild dilation of the ascending aorta, no shunt. He was discharged with ASA given ICH.  The patient was discharged with a 30-day heart monitor.  Patient states he has made some improvement but still has some occasional word finding difficulties.  His swallowing function has improved though he has a swallow test scheduled for a few weeks later.  Speech is much improved.  Still has some weakness in the left side and grip as well as in his left leg while walking.  Can walk independently but uses a walker for long distances.  His blood pressure is under good control today it is  131/80.  States his sugars are doing much better.  Currently getting home physical and occupational therapy 2 days a week.  Update 09/11/2022 : He returns for follow-up after last visit with me 6 months ago.  He was seen  in the ER on 05/20/2022 by Dr. Jerrell Belfast neurohospitalist for sudden onset of slurred speech and increased left-sided weakness.  MRI scan of the brain was obtained which was negative for any acute abnormality.  Patient is very thin with that may have been related to increased anxiety.  Patient remains on Eliquis which is tolerating well without bruising or bleeding.  He is tolerating Lipitor well without muscle aches and pains.  He did have lipid profile checked in his primary care physician's office 7 months ago which was okay.  Hemoglobin A1c on 07/24/2022 was optimal at 6.1.  He is currently participating in outpatient physical and Occupational Therapy at [facility].  He can walk short distances by himself but uses a walker for long distances.  He has not had any falls or injuries.  He had an endoscopy yesterday and unfortunately was diagnosed with a gastric mass and the biopsy result is pending. ROS:   14 system review of systems is positive for nausea, vomiting, abdominal pain, weakness, numbness, imbalance, walking difficulty, poor vision and all other systems negative  PMH:  Past Medical History:  Diagnosis Date   Acute ischemic stroke (HCC) 09/06/2017   Acute renal failure superimposed on stage 3a chronic kidney disease (HCC) 07/08/2019   Acute right-sided weakness    Allergies    Anasarca    Anemia 07/10/2017   Arthritis    CHF (congestive heart failure) (HCC)    "coinsided w/kidney problems I was having 06/2017"   Chicken pox    CKD (chronic kidney disease) stage 3, GFR 30-59 ml/min (HCC) 06/2017   Depression    Elevated troponin 07/08/2019   High cholesterol    History of cardiomyopathy    LVEF 40 to 45% in April 2019 - subsequently normalized   Hyperbilirubinemia 07/10/2017   Hypertension    Ischemic stroke (HCC)    Small left internal capsule infarct due to lacunar disease   Morbid obesity (HCC)    Normocytic anemia 12/16/2017   Recurrent incisional hernia with incarceration s/p repair  10/22/2017 10/21/2017   Stroke (HCC) 08/2017   "right sided weakness since; getting stronger though" (10/22/2017)   Type 2 diabetes mellitus (HCC)     Social History:  Social History   Socioeconomic History   Marital status: Divorced    Spouse name: Not on file   Number of children: Not on file   Years of education: Not on file   Highest education level: Not on file  Occupational History   Not on file  Tobacco Use   Smoking status: Never   Smokeless tobacco: Never  Vaping Use   Vaping Use: Never used  Substance and Sexual Activity   Alcohol use: Not Currently    Comment: occasional beer   Drug use: Never   Sexual activity: Not Currently  Other Topics Concern   Not on file  Social History Narrative   He lives with his sister , [name] and she is his MPOA after his stokes   Social Determinants of Health   Financial Resource Strain: High Risk (03/20/2020)   Overall Financial Resource Strain (CARDIA)    Difficulty of Paying Living Expenses: Hard  Food Insecurity: No Food Insecurity (09/10/2022)   Hunger Vital Sign    Worried  About Running Out of Food in the Last Year: Never true    Ran Out of Food in the Last Year: Never true  Transportation Needs: Unmet Transportation Needs (09/10/2022)   PRAPARE - Administrator, Civil Service (Medical): Yes    Lack of Transportation (Non-Medical): Yes  Physical Activity: Not on file  Stress: Not on file  Social Connections: Not on file  Intimate Partner Violence: Not At Risk (09/10/2022)   Humiliation, Afraid, Rape, and Kick questionnaire    Fear of Current or Ex-Partner: No    Emotionally Abused: No    Physically Abused: No    Sexually Abused: No    Medications:   Current Outpatient Medications on File Prior to Visit  Medication Sig Dispense Refill   amLODipine (NORVASC) 10 MG tablet Take 10 mg by mouth daily.     apixaban (ELIQUIS) 5 MG TABS tablet Take 1 tablet (5 mg total) by mouth 2 (two) times daily. 60 tablet  11   atorvastatin (LIPITOR) 40 MG tablet Take 1 tablet (40 mg total) by mouth daily at 6 PM. 90 tablet 3   busPIRone (BUSPAR) 7.5 MG tablet Take 1 tablet (7.5 mg total) by mouth 2 (two) times daily. 60 tablet 3   Continuous Blood Gluc Sensor (FREESTYLE LIBRE 2 SENSOR) MISC Place 1 sensor on the skin every 14 days. Use to check glucose continuously 2 each 11   dapagliflozin propanediol (FARXIGA) 10 MG TABS tablet Take 1 tablet (10 mg) by mouth once daily/please contact office to schedule follow up prior to further refills. 180 tablet 0   hydrALAZINE (APRESOLINE) 50 MG tablet Take 1 tablet (50 mg total) by mouth in the morning and at bedtime. please contact office to schedule follow up prior to further refills. 180 tablet 0   irbesartan (AVAPRO) 300 MG tablet Take 300 mg by mouth daily.     oxyCODONE (ROXICODONE) 5 MG immediate release tablet Take 1 tablet (5 mg total) by mouth every 8 (eight) hours as needed for moderate pain. 10 tablet 0   torsemide (DEMADEX) 20 MG tablet Take 2 tablets (40 mg total) by mouth daily. 90 tablet 3   No current facility-administered medications on file prior to visit.    Allergies:   Allergies  Allergen Reactions   Pollen Extract Other (See Comments)   Sulfa Antibiotics     Physical Exam General: Morbidly obese pleasant middle-age Caucasian male, seated, in no evident distress Head: head normocephalic and atraumatic.   Neck: supple with no carotid or supraclavicular bruits Cardiovascular: regular rate and rhythm, no murmurs Musculoskeletal: no deformity Skin:  no rash/petichiae Vascular:  Normal pulses all extremities  Neurologic Exam Mental Status: Awake and fully alert. Oriented to place and time. Recent and remote memory intact. Attention span, concentration and fund of knowledge appropriate. Mood and affect appropriate.  Cranial Nerves: Fundoscopic exam not done pupils equal, irregular and sluggishly reactive to light.  Patient is legally blind with  left eye only motion detection and right eye finger counting at 2 feet extraocular movements full without nystagmus. Visual fields full to confrontation. Hearing intact. Facial sensation intact. Face, tongue, palate moves normally and symmetrically.  Motor: Mild spastic left hemiparesis with 4/5 strength with weakness of left grip intrinsic hand muscles as well as left ankle dorsiflexors and hip flexors.  Tone is increased on the left compared to the right.. Sensory.: intact to touch , pinprick , position and vibratory sensation.  Coordination: Rapid alternating movements normal in all  extremities. Finger-to-nose and heel-to-shin performed accurately bilaterally. Gait and Station: Arises from chair without difficulty. Stance is broad-based.  Walks with some dragging of the left leg.  Using a walker unsteady on a narrow base and by standing on either foot unsupported  Reflexes: 1+ and symmetric and ankle jerks are depressed bilaterally. Toes downgoing.     ASSESSMENT: 52 year old Caucasian male with right basal ganglia hemorrhagic infarct in August 2023 as well as bilateral lacunar infarcts in October 2023 due to small vessel disease.  Recent diagnosis of paroxysmal A-fib on cardiac monitoring.  Vascular risk factors of diabetes, hypertension, hyperlipidemia, obesity, congestive heart failure and at risk for sleep apnea.  Recent diagnosis of gastric mass on endoscopy awaiting biopsy results     PLAN:I had a long d/w patient and his sister about his recent strokes and  diagnosis of paroxysmal A-fib, risk for recurrent stroke/TIAs, personally independently reviewed imaging studies and stroke evaluation results and answered questions.Continue Eliquis 5 mg twice daily  for secondary stroke prevention and maintain strict control of hypertension with blood pressure goal below 130/90, diabetes with hemoglobin A1c goal below 6.5% and lipids with LDL cholesterol goal below 70 mg/dL. I also advised the patient to  eat a healthy diet with plenty of whole grains, cereals, fruits and vegetables, exercise regularly and maintain ideal body weight .  Continue ongoing: Occupational and physical therapy.   He was advised to use his walker while walking outdoors and long distances..  Followup in the future with me in 1 year or call earlier if necessary.  Greater than 50% time during this 35-minute   visit was spent on counseling and coordination of care about his recent atrial fibrillation, hemorrhagic stroke, lacunar strokes and answering questions.  Delia Heady, MD Note: This document was prepared with digital dictation and possible smart phrase technology. Any transcriptional errors that result from this process are unintentional.

## 2022-09-12 NOTE — Telephone Encounter (Signed)
Copied from CRM [id number]. Topic: General - Other >> Sep 12, 2022 11:02 AM Dondra Prader A wrote: Reason for CRM: Pt was returning a phone call for transition of care and states that everything is going fine.

## 2022-09-13 ENCOUNTER — Other Ambulatory Visit: Payer: Self-pay | Admitting: Pathology

## 2022-09-13 NOTE — Transitions of Care (Post Inpatient/ED Visit) (Signed)
09/13/2022  Name: Jeremiah Vance MRN: [id number] DOB: 1970-05-27  Today's TOC FU Call Status: Today's TOC FU Call Status:: Successful TOC FU Call Competed Unsuccessful Call (2nd Attempt) Date: 09/12/22 [facility] FU Call Complete Date: 09/13/22  Transition Care Management Follow-up Telephone Call Discharge Facility: [facility]) Type of Discharge: Inpatient Admission Primary Inpatient Discharge Diagnosis:: lower abdominal pain Reason for ED Visit: Other: How have you been since you were released from the hospital?: Better Any questions or concerns?: No  Items Reviewed: Did you receive and understand the discharge instructions provided?: Yes Medications obtained,verified, and reconciled?: Yes (Medications Reviewed) Any new allergies since your discharge?: No Dietary orders reviewed?: NA  Medications Reviewed Today: Medications Reviewed Today     Reviewed by Bobbye Morton, CMA (Certified Medical Assistant) on 09/11/22 at 1459  Med List Status: <None>   Medication Order Taking? Sig Documenting Provider Last Dose Status Informant  amLODipine (NORVASC) 10 MG tablet [id number]  Take 10 mg by mouth daily. [provider]  Active Self           Med Note Kirke Corin Sep 09, 2022 12:54 AM) Last fill 06-03-22 x 90 days supply.  apixaban (ELIQUIS) 5 MG TABS tablet [id number]  Take 1 tablet (5 mg total) by mouth 2 (two) times daily. Lurene Shadow, MD  Active   atorvastatin (LIPITOR) 40 MG tablet [id number]  Take 1 tablet (40 mg total) by mouth daily at 6 PM. Enedina Finner, MD  Active Self           Med Note Kirke Corin Sep 09, 2022 12:49 AM) Last fill: 06-03-22 x 90 days supply.  busPIRone (BUSPAR) 7.5 MG tablet [id number]  Take 1 tablet (7.5 mg total) by mouth 2 (two) times daily. Debera Lat, PA-C  Active   Continuous Blood Gluc Sensor (FREESTYLE LIBRE 2 SENSOR) [place] [id number]  Place 1 sensor on the skin every 14 days. Use to check glucose  continuously Debera Lat, PA-C  Active Self  dapagliflozin propanediol (FARXIGA) 10 MG TABS tablet [id number]  Take 1 tablet (10 mg) by mouth once daily/please contact office to schedule follow up prior to further refills. Debbe Odea, MD  Active Self           Med Note Kirke Corin Sep 09, 2022 12:55 AM) Pt states he is taking, but last fill was 06-03-22 x 30 days supply.  hydrALAZINE (APRESOLINE) 50 MG tablet [id number]  Take 1 tablet (50 mg total) by mouth in the morning and at bedtime. please contact office to schedule follow up prior to further refills. Debbe Odea, MD  Active Self           Med Note Kirke Corin Sep 09, 2022 12:56 AM) Last fill 06-03-22 x 90 days supply.  irbesartan (AVAPRO) 300 MG tablet [id number]  Take 300 mg by mouth daily. [provider]  Active Pharmacy Records, Self           Med Note Kirke Corin Sep 09, 2022 12:56 AM) Last fill: 06-03-22 x 90 days supply.  oxyCODONE (ROXICODONE) 5 MG immediate release tablet [id number]  Take 1 tablet (5 mg total) by mouth every 8 (eight) hours as needed for moderate pain. Lurene Shadow, MD  Active   torsemide (DEMADEX) 20 MG tablet [id number]  Take 2 tablets (40 mg total) by mouth daily. Enedina Finner, MD  Active Self  Med Note Kirke Corin Sep 09, 2022 12:57 AM) Pt states he is taking, but last fill was 03-21-22 x 90 days supply.            Home Care and Equipment/Supplies: Were Home Health Services Ordered?: No Any new equipment or medical supplies ordered?: No  Functional Questionnaire: Do you need assistance with bathing/showering or dressing?: No Do you need assistance with meal preparation?: No Do you need assistance with eating?: No Do you have difficulty maintaining continence: No Do you need assistance with getting out of bed/getting out of a chair/moving?: No Do you have difficulty managing or taking your medications?: No  Follow up appointments  reviewed: PCP Follow-up appointment confirmed?: No MD Provider Line Number:[phone] Given: Yes Specialist Hospital Follow-up appointment confirmed?: NA Do you need transportation to your follow-up appointment?: No Do you understand care options if your condition(s) worsen?: Yes-patient verbalized understanding    SIGNATURE Fredirick Maudlin

## 2022-09-16 ENCOUNTER — Ambulatory Visit: Payer: Medicaid Other | Admitting: Occupational Therapy

## 2022-09-16 ENCOUNTER — Encounter: Payer: Self-pay | Admitting: Oncology

## 2022-09-16 ENCOUNTER — Ambulatory Visit: Payer: Medicaid Other | Admitting: Physical Therapy

## 2022-09-16 ENCOUNTER — Other Ambulatory Visit (HOSPITAL_COMMUNITY): Payer: Self-pay

## 2022-09-17 ENCOUNTER — Ambulatory Visit: Payer: Medicaid Other

## 2022-09-17 DIAGNOSIS — R2681 Unsteadiness on feet: Secondary | ICD-10-CM

## 2022-09-17 DIAGNOSIS — M6281 Muscle weakness (generalized): Secondary | ICD-10-CM

## 2022-09-17 DIAGNOSIS — R278 Other lack of coordination: Secondary | ICD-10-CM

## 2022-09-17 DIAGNOSIS — R262 Difficulty in walking, not elsewhere classified: Secondary | ICD-10-CM | POA: Diagnosis not present

## 2022-09-17 NOTE — Therapy (Signed)
Occupational Therapy Neuro Treatment Note  Patient Name: Jeremiah Vance MRN: [id number] DOB:[birth year], 52 y.o., male Today's Date: 09/17/2022  PCP: Debera Lat, PA-C REFERRING PROVIDER: Debera Lat, PA-C   OT End of Session - 09/17/22 0942     Visit Number 14    Number of Visits 24    Date for OT Re-Evaluation 10/21/22    Progress Note Due on Visit 10    OT Start Time 0930    OT Stop Time 1015    OT Time Calculation (min) 45 min    Equipment Utilized During Treatment RW    Activity Tolerance Patient tolerated treatment well    Behavior During Therapy [facility] for tasks assessed/performed            Past Medical History:  Diagnosis Date   Acute ischemic stroke (HCC) 09/06/2017   Acute renal failure superimposed on stage 3a chronic kidney disease (HCC) 07/08/2019   Acute right-sided weakness    Allergies    Anasarca    Anemia 07/10/2017   Arthritis    CHF (congestive heart failure) (HCC)    "coinsided w/kidney problems I was having 06/2017"   Chicken pox    CKD (chronic kidney disease) stage 3, GFR 30-59 ml/min (HCC) 06/2017   Depression    Elevated troponin 07/08/2019   High cholesterol    History of cardiomyopathy    LVEF 40 to 45% in April 2019 - subsequently normalized   Hyperbilirubinemia 07/10/2017   Hypertension    Ischemic stroke (HCC)    Small left internal capsule infarct due to lacunar disease   Morbid obesity (HCC)    Normocytic anemia 12/16/2017   Recurrent incisional hernia with incarceration s/p repair 10/22/2017 10/21/2017   Stroke (HCC) 08/2017   "right sided weakness since; getting stronger though" (10/22/2017)   Type 2 diabetes mellitus (HCC)    Past Surgical History:  Procedure Laterality Date   ABDOMINAL HERNIA REPAIR  2008; 10/22/2017   "scope; OPEN REPAIR INCARCERATED VENTRAL HERNIA   ESOPHAGOGASTRODUODENOSCOPY (EGD) WITH PROPOFOL N/A 09/10/2022   Procedure: ESOPHAGOGASTRODUODENOSCOPY (EGD) WITH PROPOFOL;  Surgeon: Toney Reil, MD;   Location: [facility] ENDOSCOPY;  Service: Gastroenterology;  Laterality: N/A;   HERNIA REPAIR     KNEE ARTHROSCOPY Right 1989   VENTRAL HERNIA REPAIR N/A 10/22/2017   Procedure: OPEN REPAIR INCARCERATED VENTRAL HERNIA;  Surgeon: Glenna Fellows, MD;  Location: [facility] OR;  Service: General;  Laterality: N/A;   Patient Active Problem List   Diagnosis Date Noted   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   IDA (iron deficiency anemia) 09/09/2022   Gastric mass 09/09/2022   Gastrointestinal hemorrhage 09/09/2022   Paroxysmal atrial fibrillation (HCC) 09/08/2022   Abdominal pain 09/08/2022   Anxiety and depression 07/26/2022   Dysarthria 05/20/2022   Need for diphtheria-tetanus-pertussis (Tdap) vaccine 03/20/2022   Type 2 diabetes mellitus without complication, with long-term current use of insulin (HCC) 01/16/2022   Dyslipidemia 01/16/2022   Chronic diastolic CHF (congestive heart failure) (HCC) 01/16/2022   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    Intracranial hemorrhage (HCC)    HFrEF (heart failure with reduced ejection fraction) (HCC)    Obesity, Class III, BMI 40-49.9 (morbid obesity) (HCC) 05/22/2020   Other cirrhosis of liver (HCC)    Ventral hernia without obstruction or gangrene    Shortness of breath    Neck pain    Prostate cancer screening 01/21/2020   Lymphedema of both lower extremities 10/17/2019   History of stroke 10/17/2019  CKD (chronic kidney disease) stage 3, GFR 30-59 ml/min (HCC) 08/31/2019   Swelling of limb 08/31/2019   DM (diabetes mellitus), type 2 with complications (HCC) 07/08/2019   Vitreous floaters of both eyes 06/03/2018   Moderate obstructive sleep apnea 02/11/2018   Chronic fatigue 12/16/2017   Essential hypertension 12/16/2017   History of small bowel obstruction 10/24/2017   Adjustment disorder with depressed mood 09/30/2017   Stroke (HCC) 09/14/2017   Hyperlipidemia    Gout    Elevated LFTs    Heart failure with  preserved ejection fraction (HCC)    Morbid obesity (HCC)    Hypoalbuminemia 07/11/2017   ONSET DATE: Aug 2023  REFERRING DIAG: L sided weakness  THERAPY DIAG:  Muscle weakness (generalized)  Other lack of coordination  Rationale for Evaluation and Treatment: Rehabilitation  SUBJECTIVE:  SUBJECTIVE STATEMENT: Pt reports that he feels like he can do his normal activities following his recent hospitalization but he's just more tired.  Pt reports his sister is not having to help him with anything more than she had been helping him with prior to hospitalization.  PERTINENT HISTORY: Pt reports 2-3 CVAs in Aug of 2023, but a total of 5 CVAs over the course of 4 years.  After the strokes in Aug, pt participated in [facility] therapy for 2-3 months.  Pt reports that he had been able to return to work up until 05/20/22.  Pt reported that his work felt like he was exhibiting stroke symptoms, so he went to the ED.  Per chart from ED visit on 05/20/22:     Clinical Course as of 05/20/22 1559  Mon May 20, 2022  1505 Prior CVA with soft blood pressures - reactivation of old CVA.  CTA negative. Code stroke and if MRI negative then will dc home.  [SM]     Clinical Course User Index [SM] Corena Herter, MD  MRI negative.  Patient states that he is feeling better.  Blood pressure improved while in the emergency department.  Last blood pressure 130 systolic.  Patient discharged home in stable condition with discussion to follow-up as an outpatient and return precautions given.   Corena Herter, MD 05/20/22 1733     PRECAUTIONS: fall  WEIGHT BEARING RESTRICTIONS: No  PAIN:  Are you having pain? Yes: NPRS scale: 6-7/10 Pain location: bilat knees, L shoulder Pain description: achy Aggravating factors: raising L arm, cold weather, walking, stairs Relieving factors: rest  FALLS: Has patient fallen in last 6 months? Yes. 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  PATIENT GOALS: To get the L side stronger and return to work.   OBJECTIVE:   HAND DOMINANCE: Right  ADLs: Overall ADLs: modified indep with primarily use of the R arm. Transfers/ambulation related to ADLs: uses the walker outside, indoors no walker Eating: sister cuts food  Grooming: indep UB Dressing: extra time to manage clothing fasteners LB Dressing: slip on shoes; tucks laces into shoes Toileting: indep Bathing: modified indep Tub Shower transfers: walk in shower Equipment: see above  IADLs:  Shopping: pt can accompany sister and pt pushes the cart Light housekeeping: shared task between pt and sister  Meal Prep: some difficulty opening new packages/containers but can manage simple light meal prep/microwavable meals  Community mobility: able to drive, limited ambulator d/t obesity, arthritic knees, and L sided weakness Medication management: sister sets up pills into weekly pill organizer  Financial management: pt manages  Handwriting: No issues (pt is R hand dominant)  POSTURE COMMENTS:  rounded shoulders  ACTIVITY TOLERANCE: Activity tolerance: fair; limited community ambulator  FUNCTIONAL OUTCOME MEASURES: FOTO: 52; predicted 55  UPPER EXTREMITY ROM:    Active ROM Right Eval WNL Left eval Left 07/29/2022  Shoulder flexion  98 (limited by pain) 121  Shoulder abduction  102 110  Shoulder adduction     Shoulder extension     Shoulder internal rotation     Shoulder external rotation     Elbow flexion     Elbow extension     Wrist flexion     Wrist  extension     Wrist ulnar deviation     Wrist radial deviation     Wrist pronation     Wrist supination     (Blank rows = not tested)  UPPER EXTREMITY MMT:     MMT Right eval Left eval Left  07/29/2022  Shoulder flexion 4+ NT (pain) 4-/5  Shoulder abduction 4 NT (pain) 3/5  Shoulder adduction     Shoulder extension     Shoulder internal rotation     Shoulder external rotation     Middle trapezius     Lower trapezius     Elbow flexion 5 4- 4/5  Elbow extension 5 4- 4/5  Wrist flexion 5 3+ 4-/5  Wrist extension 5 3+ 4-/5  Wrist ulnar deviation     Wrist radial deviation     Wrist pronation     Wrist supination     (Blank rows = not tested)  HAND FUNCTION: Grip strength: Right: 71 lbs; Left: 27 lbs, Lateral pinch: Right: 16 lbs, Left: 6 lbs, and 3 point pinch: Right: 18 lbs, Left: 8 lbs  07/29/2022: Grip strength: Right: 71 lbs; Left: 40 lbs, Lateral pinch: Right: 16 lbs, Left: 7 lbs, and 3 point pinch: Right: 18 lbs, Left: 6 lbs  COORDINATION: Finger Nose Finger test: L sided good accuracy but slower  9 Hole Peg test: Right: 38 sec; Left: 46 sec Moderate difficulty opposing L hand digits to thumb   07/29/2022:  9-Hole Peg test: Right: 51 sec; Left: 51 sec. Pt. scores affecting by limited vision. 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, wears reading glasses all the time and reports increased blurred vision in the R eye since strokes in Aug of 2023  VISION ASSESSMENT: Tracking/Visual pursuits: impaired Saccades: impaired Visual Fields: pt limited in all visual fields of R eye (chart notes floaters both eyes)  Patient has difficulty  with following activities due to following visual impairments: pt does not drive  PERCEPTION: WFL  PRAXIS: Impaired: Motor planning  TODAY'S TREATMENT:    Therapeutic Exercise: Facilitated hand strengthening with use of hand gripper set at min-mod resistance with 2 red  bands to complete 5 sets 10 reps of grip squeezes, spread throughout duration of session.  Facilitated pinch strengthening with use of therapy resistant clothespins to target lateral and 3 point pinch of L hand.  Able to pinch all colors but blue and black with a lateral pinch, and all colors but black with a 3 point pinch; completed 2 sets of each pinch type, clipping pins on/off a vertical dowel for the first set, and a horiz dowel on the second set to allow L arm to rest on table top to compensate for L shoulder pain.   Therapeutic Activity: Facilitated L hand FMC/dexterity skills working with narrow 2" sticks.  Pt practiced picking up sticks 1 at a time, scooping multiple in hand, storing multiple in palm, translatory skills to move sticks from palm to fingertips, and repositioning sticks within fingertips to rotate 180 degrees.  Intermittent min vc and tactile cues to tighten L 4th and 5th digits to palm during storage and translatory movements to prevent dropping sticks from ulnar side of the palm.  PATIENT EDUCATION: Education details: LUE strengthening and coordination training Person educated: Patient Education method: Explanation and Verbal cues Education comprehension: verbalized understanding  HOME EXERCISE PROGRAM: Theraputty, theraband  GOALS: Goals reviewed with patient? Yes  SHORT TERM GOALS: Target date:09/09/2022  Pt will be indep to perform HEP for increasing strength and coordination throughout the LUE. Baseline: 07/29/2022: Independent however required occasional visual cuing.  Eval: issued putty exercises Goal status: Partially met  LONG TERM GOALS: Target date: 10/21/2022  Pt will increase FOTO  score to 55 or better to indicate clinically relevant improvement in self perceived use of L arm to perform daily tasks.  Baseline: 07/29/2022: 52 Eval: 52 Goal status: Ongoing   2.  Pt will increase L grip strength by 10 or more lbs in order to hold and carry ADL supplies in L hand. Baseline: 07/29/2022: Left grip: 40 lbs. Eval: L grip 27 lbs (R 71 lbs) Goal status: Ongoing  3.  Pt will increase LUE strength at the elbow and wrist in order to carry a heavy laundry basket with BUEs. Baseline: 07/29/2022:  L elbow flex 4, wrist flex 4- Eval: L elbow flex 4-, wrist flex 3+ Goal status: Ongoing  4.  Pt will increase L lateral pinch strength in order to ease ability to open small bottles and food packages.  Baseline: 07/29/2022:  L 7 lbs Eval: L 6 lbs (R 16 lbs) Goal status:Ongoing  5.  Pt will increase L FMC/dexterity skills to ease ability to manipulate small ADL supplies.  Baseline:07/29/2022: L 9 hole: 51 sec (R 51 sec) Pt. Is able to grasp larger pills easier. Eval: L 9 hole 48 sec (R 36 sec); extra time to manage clothing fasteners and to pick up pills Goal status: Ongoing  ASSESSMENT: CLINICAL IMPRESSION: Pt returns today following an ED visit on 09/08/22 and 2 night hospital observation d/t abdominal pain.  Pt has follow up appointments this week d/t concern for abdominal ca.  Pt reports that functionally his sister is not having to help him with anything more than she did before this hospitalization, but just feels a bit more fatigued overall.  Pt tolerated therapeutic exercises and activities well this date with rest breaks.  Intermittent min vc and tactile cues to tighten L 4th and 5th digits to palm during storage and translatory movements to prevent dropping sticks from ulnar side of the palm.  Pt continues to present with LUE weakness and coordination deficits  which limit efficiency and independence with performance of ADL/IADL tasks.  Pt continues to work towards goals in plan of care into  the next recertification period to maximize safety and independence in necessary daily tasks.    PERFORMANCE DEFICITS: in functional skills including ADLs, IADLs, coordination, dexterity, sensation, strength, pain, Fine motor control, Gross motor control, mobility, balance, body mechanics, endurance, decreased knowledge of use of DME, vision, and UE functional use, and psychosocial skills including coping strategies, environmental adaptation, habits, and routines and behaviors.   IMPAIRMENTS: are limiting patient from ADLs and IADLs.   CO-MORBIDITIES: has co-morbidities such as legally blind L eye, obesity, arthritis  that affects occupational performance. Patient will benefit from skilled OT to address above impairments and improve overall function.  MODIFICATION OR ASSISTANCE TO COMPLETE EVALUATION: No modification of tasks or assist necessary to complete an evaluation.  OT OCCUPATIONAL PROFILE AND HISTORY: Problem focused assessment: Including review of records relating to presenting problem.  CLINICAL DECISION MAKING: Moderate - several treatment options, min-mod task modification necessary  REHAB POTENTIAL: Good  EVALUATION COMPLEXITY: Moderate  PLAN:  OT FREQUENCY: 2x/week  OT DURATION: 12 weeks  PLANNED INTERVENTIONS: self care/ADL training, therapeutic exercise, therapeutic activity, neuromuscular re-education, manual therapy, passive range of motion, balance training, functional mobility training, moist heat, cryotherapy, contrast bath, patient/family education, energy conservation, coping strategies training, and DME and/or AE instructions  RECOMMENDED OTHER SERVICES: None at this time  CONSULTED AND AGREED WITH PLAN OF CARE: Patient  PLAN FOR NEXT SESSION: see above  Danelle Earthly, MS, OTR/L

## 2022-09-17 NOTE — Therapy (Signed)
OUTPATIENT PHYSICAL THERAPY NEURO TREATMENT   Patient Name: Jeremiah Vance MRN: [id number] DOB:January 15, 1971, 52 y.o., male Today's Date: 09/17/2022   PCP: Debera Lat PA REFERRING PROVIDER: Debera Lat PA  END OF SESSION:  PT End of Session - 09/17/22 0855     Visit Number 16    Number of Visits 48    Date for PT Re-Evaluation 11/12/22    PT Start Time 0851    PT Stop Time 0930    PT Time Calculation (min) 39 min               Past Medical History:  Diagnosis Date   Acute ischemic stroke (HCC) 09/06/2017   Acute renal failure superimposed on stage 3a chronic kidney disease (HCC) 07/08/2019   Acute right-sided weakness    Allergies    Anasarca    Anemia 07/10/2017   Arthritis    CHF (congestive heart failure) (HCC)    "coinsided w/kidney problems I was having 06/2017"   Chicken pox    CKD (chronic kidney disease) stage 3, GFR 30-59 ml/min (HCC) 06/2017   Depression    Elevated troponin 07/08/2019   High cholesterol    History of cardiomyopathy    LVEF 40 to 45% in April 2019 - subsequently normalized   Hyperbilirubinemia 07/10/2017   Hypertension    Ischemic stroke (HCC)    Small left internal capsule infarct due to lacunar disease   Morbid obesity (HCC)    Normocytic anemia 12/16/2017   Recurrent incisional hernia with incarceration s/p repair 10/22/2017 10/21/2017   Stroke (HCC) 08/2017   "right sided weakness since; getting stronger though" (10/22/2017)   Type 2 diabetes mellitus (HCC)    Past Surgical History:  Procedure Laterality Date   ABDOMINAL HERNIA REPAIR  2008; 10/22/2017   "scope; OPEN REPAIR INCARCERATED VENTRAL HERNIA   ESOPHAGOGASTRODUODENOSCOPY (EGD) WITH PROPOFOL N/A 09/10/2022   Procedure: ESOPHAGOGASTRODUODENOSCOPY (EGD) WITH PROPOFOL;  Surgeon: Toney Reil, MD;  Location: [facility];  Service: Gastroenterology;  Laterality: N/A;   HERNIA REPAIR     KNEE ARTHROSCOPY Right 1989   VENTRAL HERNIA REPAIR N/A 10/22/2017   Procedure:  OPEN REPAIR INCARCERATED VENTRAL HERNIA;  Surgeon: Glenna Fellows, MD;  Location: [facility] OR;  Service: General;  Laterality: N/A;   Patient Active Problem List   Diagnosis Date Noted   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   IDA (iron deficiency anemia) 09/09/2022   Gastric mass 09/09/2022   Gastrointestinal hemorrhage 09/09/2022   Paroxysmal atrial fibrillation (HCC) 09/08/2022   Abdominal pain 09/08/2022   Anxiety and depression 07/26/2022   Dysarthria 05/20/2022   Need for diphtheria-tetanus-pertussis (Tdap) vaccine 03/20/2022   Type 2 diabetes mellitus without complication, with long-term current use of insulin (HCC) 01/16/2022   Dyslipidemia 01/16/2022   Chronic diastolic CHF (congestive heart failure) (HCC) 01/16/2022   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    Intracranial hemorrhage (HCC)    HFrEF (heart failure with reduced ejection fraction) (HCC)    Obesity, Class III, BMI 40-49.9 (morbid obesity) (HCC) 05/22/2020   Other cirrhosis of liver (HCC)    Ventral hernia without obstruction or gangrene    Shortness of breath    Neck pain    Prostate cancer screening 01/21/2020   Lymphedema of both lower extremities 10/17/2019   History of stroke 10/17/2019   CKD (chronic kidney disease) stage 3, GFR 30-59 ml/min (HCC) 08/31/2019   Swelling of limb 08/31/2019   DM (diabetes mellitus), type 2 with complications (HCC)  07/08/2019   Vitreous floaters of both eyes 06/03/2018   Moderate obstructive sleep apnea 02/11/2018   Chronic fatigue 12/16/2017   Essential hypertension 12/16/2017   History of small bowel obstruction 10/24/2017   Adjustment disorder with depressed mood 09/30/2017   Stroke (HCC) 09/14/2017   Hyperlipidemia    Gout    Elevated LFTs    Heart failure with preserved ejection fraction (HCC)    Morbid obesity (HCC)    Hypoalbuminemia 07/11/2017    ONSET DATE: 05/20/22  REFERRING DIAG: Left sided weakness   THERAPY DIAG:   Muscle weakness (generalized)  Other lack of coordination  Unsteadiness on feet  Difficulty in walking, not elsewhere classified  Rationale for Evaluation and Treatment: Rehabilitation  SUBJECTIVE:                                                                                                                                                                                             SUBJECTIVE STATEMENT:  Pt reports 6-7/10 in the knees.  Pt notes he has been more active over the past couple of days with visiting his sister walking more.  Pt notes he is always having pain in the R knee.  Pt also reports having increased fatigue following hospital stay.   Pt accompanied by: self  PERTINENT HISTORY:   Patient presents for Left sided weakness. Patient presented to [facility] on 05/20/22 for L sided weakness and slurred speech.MRI negative.  PMH includes CVA (2019, 2023), obesity, Type II DM, HTN, HLD, lymphedema of BLE, Stage IIIa CKD, anemia, depression, hernia,arthritis, CHF. Patient had multiple strokes in  fall 2023 and received home health. 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 Patient has not worked the past two weeks due to another incident 2-3 weeks ago. Prior to stroke was independent, use a walker occasionally now.   PAIN:  Are you having pain? Yes: NPRS scale: 8/10 Pain location: R lower leg Pain description: aching Aggravating factors: Fall  Relieving factors: sitting down, keeping legs raised, hot shower  PRECAUTIONS: Fall  WEIGHT BEARING RESTRICTIONS: No  FALLS: Has patient fallen in last 6 months? Yes. Number of falls 1  LIVING ENVIRONMENT: Lives with: lives with their family Lives in: House/apartment Stairs: Yes: Internal: flight steps; on right going up Has following equipment at home: Dan Humphreys - 2 wheeled  PLOF: Independent  PATIENT GOALS: to get stronger  OBJECTIVE:   DIAGNOSTIC FINDINGS:   MRI: IMPRESSION: 1. No evidence of acute abnormality. 2. Prior  hemorrhages, prior lacunar infarcts, and prior chronic microvascular ischemic disease which is advanced for patient age. Findings could be in part secondary to  chronic hypertension.  CTA:   1. No emergent large vessel occlusion. 2. 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 3. Multifocal irregularity and moderate narrowing of the right vertebral artery, mildly progressed.  COGNITION: Overall cognitive status:  short term memory issues s/p stroke.    SENSATION: Light touch: Impaired LLE  COORDINATION: LLE coordination impaired with delay and limited coordination.   EDEMA:  Bilateral lymphedema of LE's   POSTURE: rounded shoulders, forward head, and flexed trunk    LOWER EXTREMITY MMT:    MMT Right Eval Left Eval  Hip flexion 3 3  Hip extension    Hip abduction 4- 3+  Hip adduction 4- 3+  Hip internal rotation    Hip external rotation    Knee flexion 3+ 3  Knee extension 3+ 3  Ankle dorsiflexion 4- 3-  Ankle plantarflexion 4- 3+  Ankle inversion    Ankle eversion    (Blank rows = not tested)   TRANSFERS: Assistive device utilized: None  Sit to stand: CGA Stand to sit: CGA Chair to chair: CGA    GAIT: Gait pattern: step through pattern, decreased stride length, antalgic, wide BOS, poor foot clearance- Right, and poor foot clearance- Left Distance walked: 40 ft Assistive device utilized: None Level of assistance: CGA Comments: Patient is unsteady without AD, has antalgic gait pattern with R knee however has limited strength for weightbearing on LLE.   FUNCTIONAL TESTS:  5 times sit to stand: 41 seconds  10 meter walk test: 21 seconds increased knee pain 6/10 Berg Balance Scale: 14/56  PATIENT SURVEYS:  FOTO 44  TODAY'S TREATMENT: DATE: 09/17/22   Neuro Re-ed:  -ball toss 15x standing rainbow ball with CGA and SPT; cues for coordination and stabilization with pertubation -standing soccer ball kicks in // bars for pertubation  stabilization, BUE support x 3 minutes       TherEx:  Seated LAQ's, 2x10 each LE Seated marches, 2x10 each LE Seated hamstring isometric press into dyandisc, 5 sec holds, 2x10 each LE Seated dynadisc dorsiflexion/plantarflexion, 2x10 each direction Seated hip adduction into rainbow physioball, 5 sec holds, 2x10   High knee marching in hallway with walker, pt fatigued quickly, but able to complete full length of hallway Lateral stepping along hallway with UE support on wall, 20' x2 each direction Pt required to sit and rest following the lateral stepping  STS x10 with rest break after 5  Ambulation with walker to OT office         PATIENT EDUCATION: Education details: goals, POC, HEP Person educated: Patient Education method: Explanation, Demonstration, Tactile cues, and Verbal cues Education comprehension: verbalized understanding, returned demonstration, verbal cues required, and tactile cues required  HOME EXERCISE PROGRAM: Access Code: [email or web address] URL: [email or web address] Date: 05/30/2022 Prepared by: Precious Bard  Exercises - Leg Extension  - 1 x daily - 7 x weekly - 2 sets - 10 reps - 5 hold - Seated March  - 1 x daily - 7 x weekly - 2 sets - 10 reps - 5 hold - Seated Ankle Circles  - 1 x daily - 7 x weekly - 2 sets - 10 reps - 5 hold  GOALS: Goals reviewed with patient? Yes  SHORT TERM GOALS: Target date: 06/27/2022    Patient will be independent in home exercise program to improve strength/mobility for better functional independence with ADLs. Baseline: 2/29: HEP given 4/24: completing 3-4 times per week with no issues  Goal status: MET   LONG TERM GOALS: Target date: 11/12/2022  Patient will increase FOTO score to equal to or greater than  54%   to demonstrate statistically significant improvement in mobility and quality of life.  Baseline: 2/29: 44% 4/24:55% Goal status: MET  2.  Patient (< 22 years old) will complete five  times sit to stand test in < 10 seconds indicating an increased LE strength and improved balance. Baseline: 2/29: 41 seconds with significant UE support 07/24/22:19.38 R UE support only 5/21: 21 seconds without UE support Goal status: IN PROGRESS  3.  Patient will increase Berg Balance score by > 8 points (44/56) to demonstrate decreased fall risk during functional activities. Baseline: 2/29: 14/56 4/24: 36/56 5/21: 39 Goal status: Partially Met  4.  Patient will increase 10 meter walk test to >1.23m/s as to improve gait speed for better community ambulation and to reduce fall risk. Baseline: 2/29: 21 seconds increased knee pain 4/24:20 sec 5/21: 15 seconds  Goal status: Partially Met     ASSESSMENT:  CLINICAL IMPRESSION:  Pt very fatigued with most exercises during session.  Pt performed little upright activities and required frequent seated rest breaks in order to continue with therapy.  Pt demonstrating decreased functional mobility during session today due to the weakness from hospital admission.  Pt will continue to regain function and mobility with therapy progression.   Pt will continue to benefit from skilled therapy to address remaining deficits in order to improve overall QoL and return to PLOF.       OBJECTIVE IMPAIRMENTS: Abnormal gait, cardiopulmonary status limiting activity, decreased activity tolerance, decreased balance, decreased coordination, decreased endurance, decreased mobility, difficulty walking, decreased strength, increased edema, impaired perceived functional ability, impaired flexibility, impaired sensation, impaired UE functional use, impaired vision/preception, improper body mechanics, postural dysfunction, obesity, and pain.   ACTIVITY LIMITATIONS: carrying, lifting, bending, sitting, standing, squatting, stairs, transfers, bed mobility, bathing, toileting, dressing, reach over head, hygiene/grooming, locomotion level, and caring for others  PARTICIPATION  LIMITATIONS: meal prep, cleaning, laundry, personal finances, driving, shopping, community activity, occupation, yard work, school, and church  PERSONAL FACTORS: Age, Behavior pattern, Education, Financial risk analyst, Past/current experiences, Profession, Social background, Time since onset of injury/illness/exacerbation, Transportation, and 3+ comorbidities: CVA (2019, 2023), obesity, Type II DM, HTN, HLD, lymphedema of BLE, Stage IIIa CKD, anemia, depression, hernia,arthritis, CHF  are also affecting patient's functional outcome.   REHAB POTENTIAL: Good  CLINICAL DECISION MAKING: Evolving/moderate complexity  EVALUATION COMPLEXITY: Moderate  PLAN:  PT FREQUENCY: 2x/week  PT DURATION: 12 weeks  PLANNED INTERVENTIONS: Therapeutic exercises, Therapeutic activity, Neuromuscular re-education, Balance training, Gait training, Patient/Family education, Self Care, Joint mobilization, Joint manipulation, Stair training, Vestibular training, Canalith repositioning, Visual/preceptual remediation/compensation, Orthotic/Fit training, DME instructions, Cognitive remediation, Electrical stimulation, Spinal mobilization, Cryotherapy, Manual lymph drainage, Compression bandaging, Taping, Vasopneumatic device, Traction, Ultrasound, Ionotophoresis 4mg /ml Dexamethasone, Manual therapy, and Re-evaluation  PLAN FOR NEXT SESSION: balance, strength, ambulatory endurance training    Nolon Bussing, PT, DPT Physical Therapist - [place]  [facility]  09/17/22, 8:55 AM

## 2022-09-18 ENCOUNTER — Ambulatory Visit
Admission: RE | Admit: 2022-09-18 | Discharge: 2022-09-18 | Disposition: A | Discharge status: Home / Self Care | Payer: Medicaid Other | Source: Ambulatory Visit | Attending: Oncology | Admitting: Oncology

## 2022-09-18 ENCOUNTER — Ambulatory Visit: Payer: Medicaid Other | Admitting: Physical Therapy

## 2022-09-18 ENCOUNTER — Ambulatory Visit: Payer: Medicaid Other

## 2022-09-18 DIAGNOSIS — I2510 Atherosclerotic heart disease of native coronary artery without angina pectoris: Secondary | ICD-10-CM | POA: Insufficient documentation

## 2022-09-18 DIAGNOSIS — C169 Malignant neoplasm of stomach, unspecified: Secondary | ICD-10-CM | POA: Insufficient documentation

## 2022-09-18 DIAGNOSIS — C786 Secondary malignant neoplasm of retroperitoneum and peritoneum: Secondary | ICD-10-CM | POA: Insufficient documentation

## 2022-09-18 DIAGNOSIS — N200 Calculus of kidney: Secondary | ICD-10-CM | POA: Insufficient documentation

## 2022-09-18 DIAGNOSIS — I700 Atherosclerosis of aorta: Secondary | ICD-10-CM | POA: Diagnosis not present

## 2022-09-18 DIAGNOSIS — R9389 Abnormal findings on diagnostic imaging of other specified body structures: Secondary | ICD-10-CM | POA: Diagnosis not present

## 2022-09-18 DIAGNOSIS — R160 Hepatomegaly, not elsewhere classified: Secondary | ICD-10-CM | POA: Insufficient documentation

## 2022-09-18 DIAGNOSIS — K429 Umbilical hernia without obstruction or gangrene: Secondary | ICD-10-CM | POA: Insufficient documentation

## 2022-09-18 DIAGNOSIS — K7460 Unspecified cirrhosis of liver: Secondary | ICD-10-CM | POA: Diagnosis not present

## 2022-09-18 DIAGNOSIS — K8020 Calculus of gallbladder without cholecystitis without obstruction: Secondary | ICD-10-CM | POA: Diagnosis not present

## 2022-09-18 LAB — GLUCOSE, CAPILLARY: Glucose-Capillary: 120 mg/dL — ABNORMAL HIGH (ref 70–99)

## 2022-09-18 MED ORDER — FLUDEOXYGLUCOSE F - 18 (FDG) INJECTION
13.2100 | Freq: Once | INTRAVENOUS | Status: AC | PRN
  Administered 2022-09-18: 13.21 via INTRAVENOUS

## 2022-09-19 ENCOUNTER — Encounter: Payer: Self-pay | Admitting: Oncology

## 2022-09-19 ENCOUNTER — Telehealth: Payer: Self-pay

## 2022-09-19 ENCOUNTER — Inpatient Hospital Stay: Payer: Medicaid Other | Attending: Oncology

## 2022-09-19 ENCOUNTER — Inpatient Hospital Stay: Payer: Medicaid Other

## 2022-09-19 ENCOUNTER — Inpatient Hospital Stay (HOSPITAL_BASED_OUTPATIENT_CLINIC_OR_DEPARTMENT_OTHER): Payer: Medicaid Other | Admitting: Oncology

## 2022-09-19 ENCOUNTER — Other Ambulatory Visit: Payer: Self-pay

## 2022-09-19 ENCOUNTER — Telehealth: Payer: Self-pay | Admitting: Medical

## 2022-09-19 ENCOUNTER — Other Ambulatory Visit: Payer: Medicaid Other

## 2022-09-19 VITALS — BP 115/77 | HR 71 | Temp 98.1°F | Resp 18

## 2022-09-19 VITALS — BP 99/66 | HR 75 | Temp 97.2°F | Resp 18 | Wt 241.0 lb

## 2022-09-19 DIAGNOSIS — C160 Malignant neoplasm of cardia: Secondary | ICD-10-CM | POA: Insufficient documentation

## 2022-09-19 DIAGNOSIS — I129 Hypertensive chronic kidney disease with stage 1 through stage 4 chronic kidney disease, or unspecified chronic kidney disease: Secondary | ICD-10-CM | POA: Diagnosis not present

## 2022-09-19 DIAGNOSIS — N184 Chronic kidney disease, stage 4 (severe): Secondary | ICD-10-CM | POA: Diagnosis not present

## 2022-09-19 DIAGNOSIS — R110 Nausea: Secondary | ICD-10-CM | POA: Diagnosis not present

## 2022-09-19 DIAGNOSIS — I480 Paroxysmal atrial fibrillation: Secondary | ICD-10-CM | POA: Diagnosis not present

## 2022-09-19 DIAGNOSIS — Z8673 Personal history of transient ischemic attack (TIA), and cerebral infarction without residual deficits: Secondary | ICD-10-CM | POA: Insufficient documentation

## 2022-09-19 DIAGNOSIS — R112 Nausea with vomiting, unspecified: Secondary | ICD-10-CM | POA: Insufficient documentation

## 2022-09-19 DIAGNOSIS — R634 Abnormal weight loss: Secondary | ICD-10-CM | POA: Diagnosis not present

## 2022-09-19 DIAGNOSIS — D500 Iron deficiency anemia secondary to blood loss (chronic): Secondary | ICD-10-CM

## 2022-09-19 DIAGNOSIS — I509 Heart failure, unspecified: Secondary | ICD-10-CM | POA: Diagnosis not present

## 2022-09-19 DIAGNOSIS — R9389 Abnormal findings on diagnostic imaging of other specified body structures: Secondary | ICD-10-CM

## 2022-09-19 DIAGNOSIS — Z7189 Other specified counseling: Secondary | ICD-10-CM | POA: Diagnosis not present

## 2022-09-19 DIAGNOSIS — C169 Malignant neoplasm of stomach, unspecified: Secondary | ICD-10-CM | POA: Insufficient documentation

## 2022-09-19 DIAGNOSIS — Z7901 Long term (current) use of anticoagulants: Secondary | ICD-10-CM | POA: Insufficient documentation

## 2022-09-19 DIAGNOSIS — C801 Malignant (primary) neoplasm, unspecified: Secondary | ICD-10-CM | POA: Diagnosis not present

## 2022-09-19 DIAGNOSIS — F411 Generalized anxiety disorder: Secondary | ICD-10-CM | POA: Diagnosis not present

## 2022-09-19 DIAGNOSIS — E1122 Type 2 diabetes mellitus with diabetic chronic kidney disease: Secondary | ICD-10-CM | POA: Diagnosis not present

## 2022-09-19 DIAGNOSIS — F419 Anxiety disorder, unspecified: Secondary | ICD-10-CM | POA: Insufficient documentation

## 2022-09-19 DIAGNOSIS — D509 Iron deficiency anemia, unspecified: Secondary | ICD-10-CM | POA: Insufficient documentation

## 2022-09-19 LAB — CBC WITH DIFFERENTIAL (CANCER CENTER ONLY)
Abs Immature Granulocytes: 0.11 10*3/uL — ABNORMAL HIGH (ref 0.00–0.07)
Basophils Absolute: 0.1 10*3/uL (ref 0.0–0.1)
Basophils Relative: 1 %
Eosinophils Absolute: 0.2 10*3/uL (ref 0.0–0.5)
Eosinophils Relative: 3 %
HCT: 30.2 % — ABNORMAL LOW (ref 39.0–52.0)
Hemoglobin: 9.3 g/dL — ABNORMAL LOW (ref 13.0–17.0)
Immature Granulocytes: 2 %
Lymphocytes Relative: 17 %
Lymphs Abs: 1.2 10*3/uL (ref 0.7–4.0)
MCH: 24.5 pg — ABNORMAL LOW (ref 26.0–34.0)
MCHC: 30.8 g/dL (ref 30.0–36.0)
MCV: 79.5 fL — ABNORMAL LOW (ref 80.0–100.0)
Monocytes Absolute: 0.7 10*3/uL (ref 0.1–1.0)
Monocytes Relative: 10 %
Neutro Abs: 4.7 10*3/uL (ref 1.7–7.7)
Neutrophils Relative %: 67 %
Platelet Count: 369 10*3/uL (ref 150–400)
RBC: 3.8 MIL/uL — ABNORMAL LOW (ref 4.22–5.81)
RDW: 17.6 % — ABNORMAL HIGH (ref 11.5–15.5)
WBC Count: 6.9 10*3/uL (ref 4.0–10.5)
nRBC: 0 % (ref 0.0–0.2)

## 2022-09-19 LAB — ABO/RH: ABO/RH(D): A POS

## 2022-09-19 LAB — RETIC PANEL
Immature Retic Fract: 23.1 % — ABNORMAL HIGH (ref 2.3–15.9)
RBC.: 3.75 MIL/uL — ABNORMAL LOW (ref 4.22–5.81)
Retic Count, Absolute: 103.9 10*3/uL (ref 19.0–186.0)
Retic Ct Pct: 2.8 % (ref 0.4–3.1)
Reticulocyte Hemoglobin: 26.2 pg — ABNORMAL LOW (ref >27.9)

## 2022-09-19 LAB — SAMPLE TO BLOOD BANK

## 2022-09-19 MED ORDER — SODIUM CHLORIDE 0.9 % IV SOLN
200.0000 mg | Freq: Once | INTRAVENOUS | Status: AC
  Administered 2022-09-19: 200 mg via INTRAVENOUS
  Filled 2022-09-19: qty 200

## 2022-09-19 MED ORDER — ONDANSETRON 8 MG PO TBDP: 8.0000 mg | ORAL_TABLET | Freq: Three times a day (TID) | ORAL | 1 refills | Status: DC | PRN

## 2022-09-19 MED ORDER — SODIUM CHLORIDE 0.9 % IV SOLN
Freq: Once | INTRAVENOUS | Status: AC
  Filled 2022-09-19: qty 250

## 2022-09-19 MED ORDER — LORAZEPAM 0.5 MG PO TABS: 0.5000 mg | ORAL_TABLET | Freq: Two times a day (BID) | ORAL | 0 refills | Status: DC | PRN

## 2022-09-19 MED ORDER — ONDANSETRON HCL 8 MG PO TABS
8.0000 mg | ORAL_TABLET | Freq: Three times a day (TID) | ORAL | 1 refills | Status: DC | PRN
Start: 2022-09-19 — End: 2022-09-29

## 2022-09-19 MED ORDER — LIDOCAINE-PRILOCAINE 2.5-2.5 % EX CREA
TOPICAL_CREAM | CUTANEOUS | 3 refills | Status: DC
Start: 2022-09-19 — End: 2022-09-29

## 2022-09-19 MED ORDER — PROCHLORPERAZINE MALEATE 10 MG PO TABS
10.0000 mg | ORAL_TABLET | Freq: Four times a day (QID) | ORAL | 1 refills | Status: DC | PRN
Start: 2022-09-19 — End: 2022-09-29

## 2022-09-19 MED ORDER — DEXAMETHASONE 4 MG PO TABS
8.0000 mg | ORAL_TABLET | Freq: Every day | ORAL | 1 refills | Status: AC
Start: 2022-09-19 — End: ?

## 2022-09-19 NOTE — Assessment & Plan Note (Addendum)
GE junction/gastric cardia adenocarcinoma. PET scan is done and the official report is pending.  Case was discussed at tumor board.  Preliminary PET scan showed multiple distant metastasis.  Stage IV disease. I explained to the patient the risks and benefits of chemotherapy including all but not limited to infusion reaction, hair loss, hearing loss, mouth sore, nausea, vomiting, low blood counts, bleeding, neuropathy, heart failure, kidney failure and risk of life threatening infection and even death, secondary malignancy etc.   Patient voices understanding and willing to proceed chemotherapy.  HER2 status and NGS molecular studies are pending.  May add anti-HER2 therapy/immunotherapy if he is eligible  # Chemotherapy education; Medi port placement.  Antiemetics-Zofran and Compazine; EMLA cream sent to pharmacy Supportive care measures are necessary for patient well-being and will be provided as necessary. We spent sufficient time to discuss many aspect of care, questions were answered to patient's satisfaction. Referred to palliative care service

## 2022-09-19 NOTE — Assessment & Plan Note (Signed)
He tolerated IV Venofer treatments during recent admission. Proceed with another dose of Venofer.

## 2022-09-19 NOTE — Assessment & Plan Note (Signed)
Discussed with patient and family members.  Patient's condition is not curable and treatment is with palliative intent.

## 2022-09-19 NOTE — Telephone Encounter (Signed)
Request for port placement has been faxed to IR.

## 2022-09-19 NOTE — Assessment & Plan Note (Signed)
Recommend ODT Zofran 8 mg every 8 hours as needed.  Will also send his other premedications to pharmacy.

## 2022-09-19 NOTE — Assessment & Plan Note (Signed)
Recommend Ativan 0.5 mg every 12 hours as needed for anxiety/sleep/nausea

## 2022-09-19 NOTE — Telephone Encounter (Signed)
Follow Up:     Patient said he was returning a call, but he did not know who called him.

## 2022-09-19 NOTE — Progress Notes (Signed)
Hematology/Oncology Progress note [phone] [phone]        REFERRING PROVIDER: Debera Lat, PA-C    CHIEF COMPLAINTS/PURPOSE OF CONSULTATION:  GE junction cancer/gastric cardia cancer.  ASSESSMENT & PLAN:   Goals of care, counseling/discussion Discussed with patient and family members.  Patient's condition is not curable and treatment is with palliative intent.  IDA (iron deficiency anemia) He tolerated IV Venofer treatments during recent admission. Proceed with another dose of Venofer.  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 PET scan is done and the official report is pending.  Case was discussed at tumor board.  Preliminary PET scan showed multiple distant metastasis.  Stage IV disease. I explained to the patient the risks and benefits of chemotherapy including all but not limited to infusion reaction, hair loss, hearing loss, mouth sore, nausea, vomiting, low blood counts, bleeding, neuropathy, heart failure, kidney failure and risk of life threatening infection and even death, secondary malignancy etc.   Patient voices understanding and willing to proceed chemotherapy.  HER2 status and NGS molecular studies are pending.  May add anti-HER2 therapy/immunotherapy if he is eligible  # Chemotherapy education; Medi port placement.  Antiemetics-Zofran and Compazine; EMLA cream sent to pharmacy Supportive care measures are necessary for patient well-being and will be provided as necessary. We spent sufficient time to discuss many aspect of care, questions were answered to patient's satisfaction. Referred to palliative care service  Weight loss Refer to nutritionist  Nausea without vomiting Recommend ODT Zofran 8 mg every 8 hours as needed.  Will also send his other premedications to pharmacy.   Anxiety associated with cancer diagnosis (HCC) Recommend Ativan 0.5 mg every 12 hours as needed for anxiety/sleep/nausea  Orders  Placed This Encounter  Procedures   IR IMAGING GUIDED PORT INSERTION    Will need prior to first chemo on 6/26, if possible.    Standing Status:   Future    Standing Expiration Date:   09/19/2023    Order Specific Question:   Reason for Exam (SYMPTOM  OR DIAGNOSIS REQUIRED)    Answer:   port a cath placement    Order Specific Question:   Preferred Imaging Location?    Answer:   [facility]   Amb Referral to Palliative Care    Referral Priority:   Routine    Referral Type:   Consultation    Referral Reason:   Advance Care Planning    Number of Visits Requested:   1   Follow-up in 1 to 2 weeks to start chemotherapy. All questions were answered. The patient knows to call the clinic with any problems, questions or concerns.  Rickard Patience, MD, PhD [facility] Hematology Oncology 09/19/2022    HISTORY OF PRESENTING ILLNESS:  Jeremiah Vance 52 y.o. male presents to establish care for  I have reviewed his chart and materials related to his cancer extensively and collaborated history with the patient. Summary of oncologic history is as follows: Oncology History  Gastric cancer (HCC)  09/19/2022 Cancer Staging   Staging form: Stomach, AJCC 8th Edition - Clinical stage from 09/19/2022: Stage IVB (cTX, cN2, cM1) - Signed by Rickard Patience, MD on 09/19/2022 Stage prefix: Initial diagnosis   09/19/2022 Initial Diagnosis   Gastric cancer (HCC)  09/08/2022, patient presented emergency room for evaluation of abdominal pain and vomiting.  He has also had weight loss.  Dysphagia with solid food for couple of months.  CT abdomen pelvis showed diffuse masslike thickening of the GE junction and upper  stomach with extensive abnormal lymph nodes throughout abdomen.  09/10/2022 upper endoscopy showed malignant appearance gastric tumor at the GE junction and in the cardia Biopsied.  Nodular mucosa in the second portion of duodenum.  Biopsied. GE junction mass showed moderate to poorly differentiated  adenocarcinoma undermining squamous mucosa with focal area of squamous atypia.  Cannot exclude adenosquamous carcinoma.  Intestinal metaplasia is not identified. Duodenum cold biopsy-reactive duodenitis.  Negative for dysplasia and malignancy. Stomach random biopsy negative for H. pylori, intestinal metaplasia, dysplasia and malignancy.  Stomach Cardia mass biopsy showed moderate to poorly differentiated adenocarcinoma.  Patient's case was discussed on tumor board on 09/19/2022.  Per Dr. Oneita Kras, nephrology findings of both GE junction mass and gastric cardia mass or similar. HER2 staining is pending. Molecular testing is pending.      Patient is on anticoagulation with Eliquis for atrial fibrillation.  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 He lives at home with his sister.  Patient is undergoing neuro physical therapy. + nausea   MEDICAL HISTORY:  Past Medical History:  Diagnosis Date   Acute ischemic stroke (HCC) 09/06/2017   Acute renal failure superimposed on stage 3a chronic kidney disease (HCC) 07/08/2019   Acute right-sided weakness    Allergies    Anasarca    Anemia 07/10/2017   Arthritis    CHF (congestive heart failure) (HCC)    "coinsided w/kidney problems I was having 06/2017"   Chicken pox    CKD (chronic kidney disease) stage 3, GFR 30-59 ml/min (HCC) 06/2017   Depression    Elevated troponin 07/08/2019   High cholesterol    History of cardiomyopathy    LVEF 40 to 45% in April 2019 - subsequently normalized   Hyperbilirubinemia 07/10/2017   Hypertension    Ischemic stroke (HCC)    Small left internal capsule infarct due to lacunar disease   Morbid obesity (HCC)    Normocytic anemia 12/16/2017   Recurrent incisional hernia with incarceration s/p repair 10/22/2017 10/21/2017   Stroke (HCC) 08/2017   "right sided weakness since; getting stronger though" (10/22/2017)   Type 2 diabetes mellitus (HCC)      SURGICAL HISTORY: Past Surgical History:  Procedure Laterality Date   ABDOMINAL HERNIA REPAIR  2008; 10/22/2017   "scope; OPEN REPAIR INCARCERATED VENTRAL HERNIA   ESOPHAGOGASTRODUODENOSCOPY (EGD) WITH PROPOFOL N/A 09/10/2022   Procedure: ESOPHAGOGASTRODUODENOSCOPY (EGD) WITH PROPOFOL;  Surgeon: Toney Reil, MD;  Location: [facility] ENDOSCOPY;  Service: Gastroenterology;  Laterality: N/A;   HERNIA REPAIR     KNEE ARTHROSCOPY Right 1989   VENTRAL HERNIA REPAIR N/A 10/22/2017   Procedure: OPEN REPAIR INCARCERATED VENTRAL HERNIA;  Surgeon: Glenna Fellows, MD;  Location: [facility] OR;  Service: General;  Laterality: N/A;    SOCIAL HISTORY: Social History   Socioeconomic History   Marital status: Divorced    Spouse name: Not on file   Number of children: Not on file   Years of education: Not on file   Highest education level: Not on file  Occupational History   Not on file  Tobacco Use   Smoking status: Never   Smokeless tobacco: Never  Vaping Use   Vaping Use: Never used  Substance and Sexual Activity   Alcohol use: Not Currently    Comment: occasional beer   Drug use: Never   Sexual activity: Not Currently  Other Topics Concern   Not on file  Social History Narrative   He lives with  his sister , [name] and she is his MPOA after his stokes   Social Determinants of Corporate investment banker Strain: High Risk (03/20/2020)   Overall Financial Resource Strain (CARDIA)    Difficulty of Paying Living Expenses: Hard  Food Insecurity: No Food Insecurity (09/10/2022)   Hunger Vital Sign    Worried About Programme researcher, broadcasting/film/video in the Last Year: Never true    Ran Out of Food in the Last Year: Never true  Transportation Needs: Unmet Transportation Needs (09/10/2022)   PRAPARE - Administrator, Civil Service (Medical): Yes    Lack of Transportation (Non-Medical): Yes  Physical Activity: Not on file  Stress: Not on file  Social Connections: Not on file  Intimate  Partner Violence: Not At Risk (09/10/2022)   Humiliation, Afraid, Rape, and Kick questionnaire    Fear of Current or Ex-Partner: No    Emotionally Abused: No    Physically Abused: No    Sexually Abused: No    FAMILY HISTORY: Family History  Problem Relation Age of Onset   Diabetes Mother    Stroke Mother    Arthritis Mother    Depression Mother    Heart disease Mother    Hypertension Mother    Learning disabilities Mother    Mental illness Mother    Sleep apnea Mother    Diabetes Father    Heart disease Father    Arthritis Father    Hearing loss Father    Hyperlipidemia Father    Heart attack Father    Hypertension Father    Stroke Father    Diabetes Sister    Depression Sister    Diabetes Sister    Hypertension Sister    Mental illness Sister    Sleep apnea Sister    Diabetes Maternal Grandmother    Heart disease Maternal Grandmother    Depression Maternal Grandmother    Hyperlipidemia Maternal Grandmother    Hypertension Maternal Grandmother    Stroke Maternal Grandmother    Hypertension Maternal Grandfather    Hyperlipidemia Maternal Grandfather    Stroke Maternal Grandfather    Arthritis Paternal Grandmother    Hearing loss Paternal Grandmother    Stroke Paternal Grandfather    Heart disease Paternal Grandfather    Arthritis Paternal Grandfather    Heart attack Paternal Grandfather    Melanoma Other     ALLERGIES:  is allergic to pollen extract and sulfa antibiotics.  MEDICATIONS:  Current Outpatient Medications  Medication Sig Dispense Refill   amLODipine (NORVASC) 10 MG tablet Take 10 mg by mouth daily.     apixaban (ELIQUIS) 5 MG TABS tablet Take 1 tablet (5 mg total) by mouth 2 (two) times daily. 60 tablet 11   atorvastatin (LIPITOR) 40 MG tablet Take 1 tablet (40 mg total) by mouth daily at 6 PM. 90 tablet 3   busPIRone (BUSPAR) 7.5 MG tablet Take 1 tablet (7.5 mg total) by mouth 2 (two) times daily. 60 tablet 3   Continuous Blood Gluc Sensor  (FREESTYLE LIBRE 2 SENSOR) MISC Place 1 sensor on the skin every 14 days. Use to check glucose continuously 2 each 11   dapagliflozin propanediol (FARXIGA) 10 MG TABS tablet Take 1 tablet (10 mg) by mouth once daily/please contact office to schedule follow up prior to further refills. 180 tablet 0   hydrALAZINE (APRESOLINE) 50 MG tablet Take 1 tablet (50 mg total) by mouth in the morning and at bedtime. please contact office to schedule follow  up prior to further refills. 180 tablet 0   irbesartan (AVAPRO) 300 MG tablet Take 300 mg by mouth daily.     LORazepam (ATIVAN) 0.5 MG tablet Take 1 tablet (0.5 mg total) by mouth every 12 (twelve) hours as needed for anxiety or sleep (nausea vomiting.). 60 tablet 0   ondansetron (ZOFRAN-ODT) 8 MG disintegrating tablet Take 1 tablet (8 mg total) by mouth every 8 (eight) hours as needed for nausea or vomiting. Take 72 hours after chemotherapy if needed. 90 tablet 1   torsemide (DEMADEX) 20 MG tablet Take 2 tablets (40 mg total) by mouth daily. 90 tablet 3   oxyCODONE (ROXICODONE) 5 MG immediate release tablet Take 1 tablet (5 mg total) by mouth every 8 (eight) hours as needed for moderate pain. (Patient not taking: Reported on 09/19/2022) 10 tablet 0   No current facility-administered medications for this visit.    Review of Systems  Constitutional:  Positive for appetite change, fatigue and unexpected weight change. Negative for chills and fever.  HENT:   Negative for hearing loss and voice change.   Eyes:  Negative for eye problems and icterus.  Respiratory:  Negative for chest tightness, cough and shortness of breath.   Cardiovascular:  Negative for chest pain and leg swelling.  Gastrointestinal:  Positive for nausea. Negative for abdominal distention and abdominal pain.  Endocrine: Negative for hot flashes.  Genitourinary:  Negative for difficulty urinating, dysuria and frequency.   Musculoskeletal:  Negative for arthralgias.  Skin:  Negative for  itching and rash.  Neurological:  Negative for light-headedness and numbness.  Hematological:  Negative for adenopathy. Does not bruise/bleed easily.  Psychiatric/Behavioral:  Negative for confusion.      PHYSICAL EXAMINATION: ECOG PERFORMANCE STATUS: 1 - Symptomatic but completely ambulatory  Vitals:   09/19/22 1436  BP: 99/66  Pulse: 75  Resp: 18  Temp: (!) 97.2 F (36.2 C)   Filed Weights   09/19/22 1436  Weight: 241 lb (109.3 kg)    Physical Exam Constitutional:      General: He is not in acute distress.    Appearance: He is not diaphoretic.  HENT:     Head: Normocephalic and atraumatic.  Eyes:     General: No scleral icterus.    Pupils: Pupils are equal, round, and reactive to light.  Cardiovascular:     Rate and Rhythm: Normal rate and regular rhythm.  Pulmonary:     Effort: Pulmonary effort is normal. No respiratory distress.     Breath sounds: No wheezing.  Abdominal:     General: There is no distension.     Palpations: Abdomen is soft.     Tenderness: There is no abdominal tenderness.  Musculoskeletal:        General: Normal range of motion.     Cervical back: Normal range of motion and neck supple.  Skin:    General: Skin is warm and dry.  Neurological:     Mental Status: He is alert and oriented to person, place, and time. Mental status is at baseline.     Cranial Nerves: No cranial nerve deficit.     Motor: No abnormal muscle tone.  Psychiatric:        Mood and Affect: Affect normal.     Comments: tearful      LABORATORY DATA:  I have reviewed the data as listed    Latest Ref Rng & Units 09/19/2022    2:04 PM 09/10/2022    5:41 AM 09/09/2022  10:50 AM  CBC  WBC 4.0 - 10.5 K/uL 6.9  7.1  7.6   Hemoglobin 13.0 - 17.0 g/dL 9.3  8.4  8.2   Hematocrit 39.0 - 52.0 % 30.2  28.6  27.3   Platelets 150 - 400 K/uL 369  228  279       Latest Ref Rng & Units 09/10/2022    5:41 AM 09/09/2022   10:50 AM 09/08/2022    6:51 PM  CMP  Glucose 70 - 99  mg/dL 92  161  096   BUN 6 - 20 mg/dL 20  18  17    Creatinine 0.61 - 1.24 mg/dL 0.45  4.09  8.11   Sodium 135 - 145 mmol/L 130  130  129   Potassium 3.5 - 5.1 mmol/L 4.0  4.2  4.2   Chloride 98 - 111 mmol/L 100  99  98   CO2 22 - 32 mmol/L 18  21  19    Calcium 8.9 - 10.3 mg/dL 7.7  8.5  8.6   Total Protein 6.5 - 8.1 g/dL   8.0   Total Bilirubin 0.3 - 1.2 mg/dL   1.1   Alkaline Phos 38 - 126 U/L   71   AST 15 - 41 U/L   18   ALT 0 - 44 U/L   10      RADIOGRAPHIC STUDIES: I have personally reviewed the radiological images as listed and agreed with the findings in the report. CT ABDOMEN PELVIS W CONTRAST  Result Date: 09/08/2022 CLINICAL DATA:  Worsening abdominal pain. EXAM: CT ABDOMEN AND PELVIS WITH CONTRAST TECHNIQUE: Multidetector CT imaging of the abdomen and pelvis was performed using the standard protocol following bolus administration of intravenous contrast. RADIATION DOSE REDUCTION: This exam was performed according to the departmental dose-optimization program which includes automated exposure control, adjustment of the mA and/or kV according to patient size and/or use of iterative reconstruction technique. CONTRAST:  OMNIPAQUE IOHEXOL 350 MG/ML SOLN COMPARISON:  Noncontrast CT 02/20/2020. FINDINGS: Lower chest: Breathing motion at the lung bases. No pleural effusion. There are several small lung nodules identified. Example middle lobe series 4, image 17 measuring 5 mm. Other lesions identified are also similar to smaller in size. Distribution is similar to the previous examination. Over 2 years of stability. No specific imaging follow-up. Heart is enlarged. Coronary artery calcifications are seen. Please correlate for other coronary risk factors. Hepatobiliary: No enhancing liver lesion. Patent portal vein. Stones in the nondilated gallbladder. Pancreas: Mild atrophy of the pancreatic parenchyma. Spleen: Normal in size without focal abnormality. Adrenals/Urinary Tract: Stable left  adrenal myelolipoma. Right adrenal gland is preserved. No enhancing renal mass or collecting system dilatation. The calcifications seen along each kidney actually could be vascular based on the distribution. No collecting system dilatation. The ureters have normal course and caliber down to the bladder. Preserved contours of the urinary bladder. Stomach/Bowel: No oral contrast. The large bowel has a normal course and caliber with scattered stool and diverticula. Normal appendix. Small bowel is nondilated. There is nodular wall thickening along the upper stomach and GE junction region. Please correlate for a malignant lesion. Vascular/Lymphatic: Normal caliber aorta and IVC with scattered vascular calcifications. Extensive abnormal lymph node enlargement identified. This includes retrocrural on series 2, image 13 measuring 19 x 14 mm. Gastrohepatic ligament, periceliac region node left of midline on series 2, image 23 measuring 4.0 by 3.5 cm. Porta hepatic node series 2, image 25 measuring 3.4 by 2.4 cm. Left para-aortic  lymph node more caudal on series 2, image 35 measuring 2.8 by 2.2 cm. Several other nodes are seen throughout these locations. There is also some mesenteric nodes, including along the greater curve of the stomach. Reproductive: Prostate is unremarkable. Other: Large midline anterior pelvic wall hernia with rectus muscle diastasis. Herniation of small bowel and significant mesenteric fat. Scattered stranding. There are areas of nodularity along the peritoneum anteriorly such as left upper quadrant image 35 of series 2. There is some thickening of the lateral conal fascia on the left side. Please correlate for evidence of peritoneal carcinomatosis. No significant ascites. No free air. Musculoskeletal: Scattered degenerative changes of the spine and pelvis. Transitional lumbosacral segment. Skin thickening along the anterior pelvic wall and stranding. Mild anasarca. IMPRESSION: Diffuse masslike  thickening in the area of the GE junction and upper stomach with the extensive abnormal lymph nodes throughout the abdomen including retrocrural, porta hepatis, retroperitoneum and lesser and greater curve of the stomach. In addition there are areas of suggest peritoneal carcinomatosis. Recommend further evaluation. No bowel obstruction. Large midline anterior pelvic wall hernia involving small bowel and mesenteric fat with rectus muscle diastasis. Gallstones. Critical Value/emergent results were called by telephone at the time of interpretation on 09/08/2022 at 5:40 pm to provider [facility] , who verbally acknowledged these results. Electronically Signed   By: Karen Kays M.D.   On: 09/08/2022 20:52

## 2022-09-19 NOTE — Telephone Encounter (Signed)
Left a message for a the patient to call back. He is past due for a follow up appointment.

## 2022-09-19 NOTE — Assessment & Plan Note (Signed)
Refer to nutritionist 

## 2022-09-19 NOTE — Progress Notes (Signed)
START ON PATHWAY REGIMEN - Gastroesophageal     A cycle is every 14 days:     Oxaliplatin      Leucovorin      Fluorouracil      Fluorouracil   **Always confirm dose/schedule in your pharmacy ordering system**  Patient Characteristics: Distant Metastases (cM1/pM1) / Locally Recurrent Disease, Adenocarcinoma - Esophageal, GE Junction, and Gastric, First Line, HER2 Negative/Unknown, PD?L1 Expression CPS < 5/Negative/Unknown, MSS/pMMR or MSI Unknown Therapeutic Status: Distant Metastases (No Additional Staging) Histology: Adenocarcinoma Disease Classification: Gastric Line of Therapy: First Line HER2 Status: Awaiting Test Results PD-L1 Expression Status: Awaiting Test Results Microsatellite/Mismatch Repair Status: Unknown Intent of Therapy: Non-Curative / Palliative Intent, Discussed with Patient 

## 2022-09-19 NOTE — H&P (View-Only) (Signed)
Hematology/Oncology Progress note [phone] [phone]        REFERRING PROVIDER: Ostwalt, Janna, PA-C    CHIEF COMPLAINTS/PURPOSE OF CONSULTATION:  GE junction cancer/gastric cardia cancer.  ASSESSMENT & PLAN:   Goals of care, counseling/discussion Discussed with patient and family members.  Patient's condition is not curable and treatment is with palliative intent.  IDA (iron deficiency anemia) He tolerated IV Venofer treatments during recent admission. Proceed with another dose of Venofer.  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 PET scan is done and the official report is pending.  Case was discussed at tumor board.  Preliminary PET scan showed multiple distant metastasis.  Stage IV disease. I explained to the patient the risks and benefits of chemotherapy including all but not limited to infusion reaction, hair loss, hearing loss, mouth sore, nausea, vomiting, low blood counts, bleeding, neuropathy, heart failure, kidney failure and risk of life threatening infection and even death, secondary malignancy etc.   Patient voices understanding and willing to proceed chemotherapy.  HER2 status and NGS molecular studies are pending.  May add anti-HER2 therapy/immunotherapy if he is eligible  # Chemotherapy education; Medi port placement.  Antiemetics-Zofran and Compazine; EMLA cream sent to pharmacy Supportive care measures are necessary for patient well-being and will be provided as necessary. We spent sufficient time to discuss many aspect of care, questions were answered to patient's satisfaction. Referred to palliative care service  Weight loss Refer to nutritionist  Nausea without vomiting Recommend ODT Zofran 8 mg every 8 hours as needed.  Will also send his other premedications to pharmacy.   Anxiety associated with cancer diagnosis (HCC) Recommend Ativan 0.5 mg every 12 hours as needed for anxiety/sleep/nausea  Orders  Placed This Encounter  Procedures   IR IMAGING GUIDED PORT INSERTION    Will need prior to first chemo on 6/26, if possible.    Standing Status:   Future    Standing Expiration Date:   09/19/2023    Order Specific Question:   Reason for Exam (SYMPTOM  OR DIAGNOSIS REQUIRED)    Answer:   port a cath placement    Order Specific Question:   Preferred Imaging Location?    Answer:    [facility]   Amb Referral to Palliative Care    Referral Priority:   Routine    Referral Type:   Consultation    Referral Reason:   Advance Care Planning    Number of Visits Requested:   1   Follow-up in 1 to 2 weeks to start chemotherapy. All questions were answered. The patient knows to call the clinic with any problems, questions or concerns.  Jazz Rogala, MD, PhD [facility] 09/19/2022    HISTORY OF PRESENTING ILLNESS:  Jeremiah Vance 52 y.o. male presents to establish care for  I have reviewed his chart and materials related to his cancer extensively and collaborated history with the patient. Summary of oncologic history is as follows: Oncology History  Gastric cancer (HCC)  09/19/2022 Cancer Staging   Staging form: Stomach, AJCC 8th Edition - Clinical stage from 09/19/2022: Stage IVB (cTX, cN2, cM1) - Signed by Myeshia Fojtik, MD on 09/19/2022 Stage prefix: Initial diagnosis   09/19/2022 Initial Diagnosis   Gastric cancer (HCC)  09/08/2022, patient presented emergency room for evaluation of abdominal pain and vomiting.  He has also had weight loss.  Dysphagia with solid food for couple of months.  CT abdomen pelvis showed diffuse masslike thickening of the GE junction and upper   stomach with extensive abnormal lymph nodes throughout abdomen.  09/10/2022 upper endoscopy showed malignant appearance gastric tumor at the GE junction and in the cardia Biopsied.  Nodular mucosa in the second portion of duodenum.  Biopsied. GE junction mass showed moderate to poorly differentiated  adenocarcinoma undermining squamous mucosa with focal area of squamous atypia.  Cannot exclude adenosquamous carcinoma.  Intestinal metaplasia is not identified. Duodenum cold biopsy-reactive duodenitis.  Negative for dysplasia and malignancy. Stomach random biopsy negative for H. pylori, intestinal metaplasia, dysplasia and malignancy.  Stomach Cardia mass biopsy showed moderate to poorly differentiated adenocarcinoma.  Patient's case was discussed on tumor board on 09/19/2022.  Per Dr. Rubinas, nephrology findings of both GE junction mass and gastric cardia mass or similar. HER2 staining is pending. Molecular testing is pending.      Patient is on anticoagulation with Eliquis for atrial fibrillation.  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 He lives at home with his sister.  Patient is undergoing neuro physical therapy. + nausea   MEDICAL HISTORY:  Past Medical History:  Diagnosis Date   Acute ischemic stroke (HCC) 09/06/2017   Acute renal failure superimposed on stage 3a chronic kidney disease (HCC) 07/08/2019   Acute right-sided weakness    Allergies    Anasarca    Anemia 07/10/2017   Arthritis    CHF (congestive heart failure) (HCC)    "coinsided w/kidney problems I was having 06/2017"   Chicken pox    CKD (chronic kidney disease) stage 3, GFR 30-59 ml/min (HCC) 06/2017   Depression    Elevated troponin 07/08/2019   High cholesterol    History of cardiomyopathy    LVEF 40 to 45% in April 2019 - subsequently normalized   Hyperbilirubinemia 07/10/2017   Hypertension    Ischemic stroke (HCC)    Small left internal capsule infarct due to lacunar disease   Morbid obesity (HCC)    Normocytic anemia 12/16/2017   Recurrent incisional hernia with incarceration s/p repair 10/22/2017 10/21/2017   Stroke (HCC) 08/2017   "right sided weakness since; getting stronger though" (10/22/2017)   Type 2 diabetes mellitus (HCC)      SURGICAL HISTORY: Past Surgical History:  Procedure Laterality Date   ABDOMINAL HERNIA REPAIR  2008; 10/22/2017   "scope; OPEN REPAIR INCARCERATED VENTRAL HERNIA   ESOPHAGOGASTRODUODENOSCOPY (EGD) WITH PROPOFOL N/A 09/10/2022   Procedure: ESOPHAGOGASTRODUODENOSCOPY (EGD) WITH PROPOFOL;  Surgeon: Vanga, Rohini Reddy, MD;  Location: [facility] ENDOSCOPY;  Service: Gastroenterology;  Laterality: N/A;   HERNIA REPAIR     KNEE ARTHROSCOPY Right 1989   VENTRAL HERNIA REPAIR N/A 10/22/2017   Procedure: OPEN REPAIR INCARCERATED VENTRAL HERNIA;  Surgeon: Hoxworth, Benjamin, MD;  Location: [facility] OR;  Service: General;  Laterality: N/A;    SOCIAL HISTORY: Social History   Socioeconomic History   Marital status: Divorced    Spouse name: Not on file   Number of children: Not on file   Years of education: Not on file   Highest education level: Not on file  Occupational History   Not on file  Tobacco Use   Smoking status: Never   Smokeless tobacco: Never  Vaping Use   Vaping Use: Never used  Substance and Sexual Activity   Alcohol use: Not Currently    Comment: occasional beer   Drug use: Never   Sexual activity: Not Currently  Other Topics Concern   Not on file  Social History Narrative   He lives with   his sister , Stephanie and she is his MPOA after his stokes   Social Determinants of Health   Financial Resource Strain: High Risk (03/20/2020)   Overall Financial Resource Strain (CARDIA)    Difficulty of Paying Living Expenses: Hard  Food Insecurity: No Food Insecurity (09/10/2022)   Hunger Vital Sign    Worried About Running Out of Food in the Last Year: Never true    Ran Out of Food in the Last Year: Never true  Transportation Needs: Unmet Transportation Needs (09/10/2022)   PRAPARE - Transportation    Lack of Transportation (Medical): Yes    Lack of Transportation (Non-Medical): Yes  Physical Activity: Not on file  Stress: Not on file  Social Connections: Not on file  Intimate  Partner Violence: Not At Risk (09/10/2022)   Humiliation, Afraid, Rape, and Kick questionnaire    Fear of Current or Ex-Partner: No    Emotionally Abused: No    Physically Abused: No    Sexually Abused: No    FAMILY HISTORY: Family History  Problem Relation Age of Onset   Diabetes Mother    Stroke Mother    Arthritis Mother    Depression Mother    Heart disease Mother    Hypertension Mother    Learning disabilities Mother    Mental illness Mother    Sleep apnea Mother    Diabetes Father    Heart disease Father    Arthritis Father    Hearing loss Father    Hyperlipidemia Father    Heart attack Father    Hypertension Father    Stroke Father    Diabetes Sister    Depression Sister    Diabetes Sister    Hypertension Sister    Mental illness Sister    Sleep apnea Sister    Diabetes Maternal Grandmother    Heart disease Maternal Grandmother    Depression Maternal Grandmother    Hyperlipidemia Maternal Grandmother    Hypertension Maternal Grandmother    Stroke Maternal Grandmother    Hypertension Maternal Grandfather    Hyperlipidemia Maternal Grandfather    Stroke Maternal Grandfather    Arthritis Paternal Grandmother    Hearing loss Paternal Grandmother    Stroke Paternal Grandfather    Heart disease Paternal Grandfather    Arthritis Paternal Grandfather    Heart attack Paternal Grandfather    Melanoma Other     ALLERGIES:  is allergic to pollen extract and sulfa antibiotics.  MEDICATIONS:  Current Outpatient Medications  Medication Sig Dispense Refill   amLODipine (NORVASC) 10 MG tablet Take 10 mg by mouth daily.     apixaban (ELIQUIS) 5 MG TABS tablet Take 1 tablet (5 mg total) by mouth 2 (two) times daily. 60 tablet 11   atorvastatin (LIPITOR) 40 MG tablet Take 1 tablet (40 mg total) by mouth daily at 6 PM. 90 tablet 3   busPIRone (BUSPAR) 7.5 MG tablet Take 1 tablet (7.5 mg total) by mouth 2 (two) times daily. 60 tablet 3   Continuous Blood Gluc Sensor  (FREESTYLE LIBRE 2 SENSOR) MISC Place 1 sensor on the skin every 14 days. Use to check glucose continuously 2 each 11   dapagliflozin propanediol (FARXIGA) 10 MG TABS tablet Take 1 tablet (10 mg) by mouth once daily/please contact office to schedule follow up prior to further refills. 180 tablet 0   hydrALAZINE (APRESOLINE) 50 MG tablet Take 1 tablet (50 mg total) by mouth in the morning and at bedtime. please contact office to schedule follow   up prior to further refills. 180 tablet 0   irbesartan (AVAPRO) 300 MG tablet Take 300 mg by mouth daily.     LORazepam (ATIVAN) 0.5 MG tablet Take 1 tablet (0.5 mg total) by mouth every 12 (twelve) hours as needed for anxiety or sleep (nausea vomiting.). 60 tablet 0   ondansetron (ZOFRAN-ODT) 8 MG disintegrating tablet Take 1 tablet (8 mg total) by mouth every 8 (eight) hours as needed for nausea or vomiting. Take 72 hours after chemotherapy if needed. 90 tablet 1   torsemide (DEMADEX) 20 MG tablet Take 2 tablets (40 mg total) by mouth daily. 90 tablet 3   oxyCODONE (ROXICODONE) 5 MG immediate release tablet Take 1 tablet (5 mg total) by mouth every 8 (eight) hours as needed for moderate pain. (Patient not taking: Reported on 09/19/2022) 10 tablet 0   No current facility-administered medications for this visit.    Review of Systems  Constitutional:  Positive for appetite change, fatigue and unexpected weight change. Negative for chills and fever.  HENT:   Negative for hearing loss and voice change.   Eyes:  Negative for eye problems and icterus.  Respiratory:  Negative for chest tightness, cough and shortness of breath.   Cardiovascular:  Negative for chest pain and leg swelling.  Gastrointestinal:  Positive for nausea. Negative for abdominal distention and abdominal pain.  Endocrine: Negative for hot flashes.  Genitourinary:  Negative for difficulty urinating, dysuria and frequency.   Musculoskeletal:  Negative for arthralgias.  Skin:  Negative for  itching and rash.  Neurological:  Negative for light-headedness and numbness.  Hematological:  Negative for adenopathy. Does not bruise/bleed easily.  Psychiatric/Behavioral:  Negative for confusion.      PHYSICAL EXAMINATION: ECOG PERFORMANCE STATUS: 1 - Symptomatic but completely ambulatory  Vitals:   09/19/22 1436  BP: 99/66  Pulse: 75  Resp: 18  Temp: (!) 97.2 F (36.2 C)   Filed Weights   09/19/22 1436  Weight: 241 lb (109.3 kg)    Physical Exam Constitutional:      General: He is not in acute distress.    Appearance: He is not diaphoretic.  HENT:     Head: Normocephalic and atraumatic.  Eyes:     General: No scleral icterus.    Pupils: Pupils are equal, round, and reactive to light.  Cardiovascular:     Rate and Rhythm: Normal rate and regular rhythm.  Pulmonary:     Effort: Pulmonary effort is normal. No respiratory distress.     Breath sounds: No wheezing.  Abdominal:     General: There is no distension.     Palpations: Abdomen is soft.     Tenderness: There is no abdominal tenderness.  Musculoskeletal:        General: Normal range of motion.     Cervical back: Normal range of motion and neck supple.  Skin:    General: Skin is warm and dry.  Neurological:     Mental Status: He is alert and oriented to person, place, and time. Mental status is at baseline.     Cranial Nerves: No cranial nerve deficit.     Motor: No abnormal muscle tone.  Psychiatric:        Mood and Affect: Affect normal.     Comments: tearful      LABORATORY DATA:  I have reviewed the data as listed    Latest Ref Rng & Units 09/19/2022    2:04 PM 09/10/2022    5:41 AM 09/09/2022     10:50 AM  CBC  WBC 4.0 - 10.5 K/uL 6.9  7.1  7.6   Hemoglobin 13.0 - 17.0 g/dL 9.3  8.4  8.2   Hematocrit 39.0 - 52.0 % 30.2  28.6  27.3   Platelets 150 - 400 K/uL 369  228  279       Latest Ref Rng & Units 09/10/2022    5:41 AM 09/09/2022   10:50 AM 09/08/2022    6:51 PM  CMP  Glucose 70 - 99  mg/dL 92  130  123   BUN 6 - 20 mg/dL 20  18  17   Creatinine 0.61 - 1.24 mg/dL 1.55  1.40  1.28   Sodium 135 - 145 mmol/L 130  130  129   Potassium 3.5 - 5.1 mmol/L 4.0  4.2  4.2   Chloride 98 - 111 mmol/L 100  99  98   CO2 22 - 32 mmol/L 18  21  19   Calcium 8.9 - 10.3 mg/dL 7.7  8.5  8.6   Total Protein 6.5 - 8.1 g/dL   8.0   Total Bilirubin 0.3 - 1.2 mg/dL   1.1   Alkaline Phos 38 - 126 U/L   71   AST 15 - 41 U/L   18   ALT 0 - 44 U/L   10      RADIOGRAPHIC STUDIES: I have personally reviewed the radiological images as listed and agreed with the findings in the report. CT ABDOMEN PELVIS W CONTRAST  Result Date: 09/08/2022 CLINICAL DATA:  Worsening abdominal pain. EXAM: CT ABDOMEN AND PELVIS WITH CONTRAST TECHNIQUE: Multidetector CT imaging of the abdomen and pelvis was performed using the standard protocol following bolus administration of intravenous contrast. RADIATION DOSE REDUCTION: This exam was performed according to the departmental dose-optimization program which includes automated exposure control, adjustment of the mA and/or kV according to patient size and/or use of iterative reconstruction technique. CONTRAST:  100mL OMNIPAQUE IOHEXOL 350 MG/ML SOLN COMPARISON:  Noncontrast CT 02/20/2020. FINDINGS: Lower chest: Breathing motion at the lung bases. No pleural effusion. There are several small lung nodules identified. Example middle lobe series 4, image 17 measuring 5 mm. Other lesions identified are also similar to smaller in size. Distribution is similar to the previous examination. Over 2 years of stability. No specific imaging follow-up. Heart is enlarged. Coronary artery calcifications are seen. Please correlate for other coronary risk factors. Hepatobiliary: No enhancing liver lesion. Patent portal vein. Stones in the nondilated gallbladder. Pancreas: Mild atrophy of the pancreatic parenchyma. Spleen: Normal in size without focal abnormality. Adrenals/Urinary Tract: Stable left  adrenal myelolipoma. Right adrenal gland is preserved. No enhancing renal mass or collecting system dilatation. The calcifications seen along each kidney actually could be vascular based on the distribution. No collecting system dilatation. The ureters have normal course and caliber down to the bladder. Preserved contours of the urinary bladder. Stomach/Bowel: No oral contrast. The large bowel has a normal course and caliber with scattered stool and diverticula. Normal appendix. Small bowel is nondilated. There is nodular wall thickening along the upper stomach and GE junction region. Please correlate for a malignant lesion. Vascular/Lymphatic: Normal caliber aorta and IVC with scattered vascular calcifications. Extensive abnormal lymph node enlargement identified. This includes retrocrural on series 2, image 13 measuring 19 x 14 mm. Gastrohepatic ligament, periceliac region node left of midline on series 2, image 23 measuring 4.0 by 3.5 cm. Porta hepatic node series 2, image 25 measuring 3.4 by 2.4 cm. Left para-aortic   lymph node more caudal on series 2, image 35 measuring 2.8 by 2.2 cm. Several other nodes are seen throughout these locations. There is also some mesenteric nodes, including along the greater curve of the stomach. Reproductive: Prostate is unremarkable. Other: Large midline anterior pelvic wall hernia with rectus muscle diastasis. Herniation of small bowel and significant mesenteric fat. Scattered stranding. There are areas of nodularity along the peritoneum anteriorly such as left upper quadrant image 35 of series 2. There is some thickening of the lateral conal fascia on the left side. Please correlate for evidence of peritoneal carcinomatosis. No significant ascites. No free air. Musculoskeletal: Scattered degenerative changes of the spine and pelvis. Transitional lumbosacral segment. Skin thickening along the anterior pelvic wall and stranding. Mild anasarca. IMPRESSION: Diffuse masslike  thickening in the area of the GE junction and upper stomach with the extensive abnormal lymph nodes throughout the abdomen including retrocrural, porta hepatis, retroperitoneum and lesser and greater curve of the stomach. In addition there are areas of suggest peritoneal carcinomatosis. Recommend further evaluation. No bowel obstruction. Large midline anterior pelvic wall hernia involving small bowel and mesenteric fat with rectus muscle diastasis. Gallstones. Critical Value/emergent results were called by telephone at the time of interpretation on 09/08/2022 at 5:40 pm to provider SILAS WONG , who verbally acknowledged these results. Electronically Signed   By: Ashok  Gupta M.D.   On: 09/08/2022 20:52    

## 2022-09-20 ENCOUNTER — Ambulatory Visit: Payer: Medicaid Other

## 2022-09-20 ENCOUNTER — Ambulatory Visit: Payer: Medicaid Other | Admitting: Physical Therapy

## 2022-09-20 ENCOUNTER — Other Ambulatory Visit: Payer: Self-pay

## 2022-09-23 ENCOUNTER — Ambulatory Visit: Payer: Medicaid Other

## 2022-09-23 ENCOUNTER — Other Ambulatory Visit: Payer: Self-pay

## 2022-09-23 ENCOUNTER — Ambulatory Visit: Payer: Medicaid Other | Admitting: Physical Therapy

## 2022-09-23 DIAGNOSIS — C160 Malignant neoplasm of cardia: Secondary | ICD-10-CM

## 2022-09-24 ENCOUNTER — Ambulatory Visit: Payer: Medicaid Other | Attending: Medical | Admitting: Medical

## 2022-09-24 ENCOUNTER — Other Ambulatory Visit: Payer: Self-pay | Admitting: Oncology

## 2022-09-24 ENCOUNTER — Telehealth: Payer: Self-pay | Admitting: *Deleted

## 2022-09-24 ENCOUNTER — Other Ambulatory Visit: Payer: Self-pay

## 2022-09-24 ENCOUNTER — Encounter: Payer: Self-pay | Admitting: Oncology

## 2022-09-24 ENCOUNTER — Inpatient Hospital Stay: Payer: Medicaid Other

## 2022-09-24 ENCOUNTER — Telehealth (INDEPENDENT_AMBULATORY_CARE_PROVIDER_SITE_OTHER): Payer: Self-pay

## 2022-09-24 DIAGNOSIS — C160 Malignant neoplasm of cardia: Secondary | ICD-10-CM

## 2022-09-24 MED ORDER — OXYCODONE HCL 5 MG PO TABS: 5.0000 mg | ORAL_TABLET | Freq: Three times a day (TID) | ORAL | 0 refills | Status: DC | PRN

## 2022-09-24 MED FILL — Dexamethasone Sodium Phosphate Inj 100 MG/10ML: INTRAMUSCULAR | Qty: 1 | Status: AC

## 2022-09-24 NOTE — Telephone Encounter (Signed)
Port will be done by [facility] surgery tomorrow. Tx has been changed to 6/28. Pt aware of changes and port insertion appt.

## 2022-09-24 NOTE — Telephone Encounter (Signed)
I attempted to contact the patient to give him pre-procedure instructions regarding a port placement on 09/25/22 at the [facility] with Dr. Gilda Crease. A message was  left for a return call. Merleen Nicely RN, contacted me through chat after I messaged that I had left a message to let me know the patient was in clinic and she would give him the pre-procedure instructions. All of this was given to Maybee to pass on to the patient.

## 2022-09-24 NOTE — Progress Notes (Deleted)
Cardiology Office Note:    Date:  09/24/2022   ID:  Jeremiah Vance, DOB [birth year], MRN [id number]  PCP:  Jeremiah Lat, PA-C  [facility] Cardiologist:  Jeremiah Odea, MD  [facility] Electrophysiologist:  None   Referring MD: Jeremiah Lat, PA-C   Chief Complaint: 6 month follow-up  History of Present Illness:    Jeremiah Vance is a 52 y.o. male with a hx of medication noncompliance, HTN, morbid obesity, chronic systolic CHF, HFimpEF, poorly controlled diabetes Type 2, stroke x3, ICH August 2023, OSA noncompliant with CPAP, chronic lower extremity edema, and CKD stage III who presents for 6 month follow-up.   Admitted in 07/2019 with CHF. Echo showed LVEF 50-55%. He was diuresed with IV lasix.    Admitted in August 2023 for headache, dizziness, and slurred speech. CT head showed small acute hemorrhage. BP in the ER was high and he was started on nicardipine infusion with improvement of BP. He was transitioned to irbesartan, Coreg and Hydralazine. UDS positive for tricyclic antidepressant. ASA was started at d/c. Echo showed LVEF 30-35%, G1DD, He was diuresed and sent home on torsemide 40mg  daily. LHC was not performed during admission due to CKD and small ICH.   He went to the ED 11/29/21 for chest pain. Vitals were normal. HS troponin was minimally elevated to 44. Ddimer was elevated, CT chest was negative. He was offered admission, but declined.    Seen 12/17/21 and reported dizziness. BP was low and Hydralazine was stopped. For reduced EF, a Myoview Lexsican was ordered.Follow-up labs showed worsening Scr/BUN and irbesartan was stopped, and hydralazine was restarted.    Myoview Lexiscan showed normal perfusion without significant ischemia or scar, normal LVEF, minimal coronary artery calcification, overall low risk.  He was admitted in October 2023 for CVA. He presented with slurred speech. MRI of the brain showed new acute ischemic infarcts up to 6.6 cm each involving the  subcortical left frontal and posterior right frontal lobe, no associated hemorrhage or mass effect.  Multiple scattered chronic hemorrhages distribution most consistent with chronic poorly controlled hypertension.  Neurology was consulted.  MRA of the head showed no emergent LVO, posterior circulation atherosclerosis with severe left P1 and P3 stenosis compared to prior CTA, no aneurysm.  Carotid ultrasound showed no significant stenosis in the anterior circulation, antegrade vertebral flow bilaterally.Echo 01/16/22 showed LVEF 55-60%, no WMA, mild LVH, G1DD, normal RVSF, mild dilation of the ascending aorta, no shunt. He was discharged with ASA given ICH.  The patient was discharged with a 30-day heart monitor.   He was last seen 01/2022 and was overall doing well.   Heart monitor showed Afib and the patient was started on Eliquis. ASA was discontinued.   Today,   Past Medical History:  Diagnosis Date   Acute ischemic stroke (HCC) 09/06/2017   Acute renal failure superimposed on stage 3a chronic kidney disease (HCC) 07/08/2019   Acute right-sided weakness    Allergies    Anasarca    Anemia 07/10/2017   Arthritis    CHF (congestive heart failure) (HCC)    "coinsided w/kidney problems I was having 06/2017"   Chicken pox    CKD (chronic kidney disease) stage 3, GFR 30-59 ml/min (HCC) 06/2017   Depression    Elevated troponin 07/08/2019   High cholesterol    History of cardiomyopathy    LVEF 40 to 45% in April 2019 - subsequently normalized   Hyperbilirubinemia 07/10/2017   Hypertension    Ischemic stroke (HCC)  Small left internal capsule infarct due to lacunar disease   Morbid obesity (HCC)    Normocytic anemia 12/16/2017   Recurrent incisional hernia with incarceration s/p repair 10/22/2017 10/21/2017   Stroke (HCC) 08/2017   "right sided weakness since; getting stronger though" (10/22/2017)   Type 2 diabetes mellitus [facility])     Past Surgical History:  Procedure Laterality Date    ABDOMINAL HERNIA REPAIR  2008; 10/22/2017   "scope; OPEN REPAIR INCARCERATED VENTRAL HERNIA   ESOPHAGOGASTRODUODENOSCOPY (EGD) WITH PROPOFOL N/A 09/10/2022   Procedure: ESOPHAGOGASTRODUODENOSCOPY (EGD) WITH PROPOFOL;  Surgeon: Jeremiah Reil, MD;  Location: [facility] ENDOSCOPY;  Service: Gastroenterology;  Laterality: N/A;   HERNIA REPAIR     KNEE ARTHROSCOPY Right 1989   VENTRAL HERNIA REPAIR N/A 10/22/2017   Procedure: OPEN REPAIR INCARCERATED VENTRAL HERNIA;  Surgeon: Glenna Fellows, MD;  Location: [facility] OR;  Service: General;  Laterality: N/A;    Current Medications: No outpatient medications have been marked as taking for the 09/24/22 encounter (Appointment) with Jeremiah Vance, Jeremiah Riviello H, PA-C.     Allergies:   Pollen extract and Sulfa antibiotics   Social History   Socioeconomic History   Marital status: Divorced    Spouse name: Not on file   Number of children: Not on file   Years of education: Not on file   Highest education level: Not on file  Occupational History   Not on file  Tobacco Use   Smoking status: Never   Smokeless tobacco: Never  Vaping Use   Vaping Use: Never used  Substance and Sexual Activity   Alcohol use: Not Currently    Comment: occasional beer   Drug use: Never   Sexual activity: Not Currently  Other Topics Concern   Not on file  Social History Narrative   He lives with his sister , Jeremiah Vance and she is his MPOA after his stokes   Social Determinants of Health   Financial Resource Strain: High Risk (03/20/2020)   Overall Financial Resource Strain (CARDIA)    Difficulty of Paying Living Expenses: Hard  Food Insecurity: No Food Insecurity (09/10/2022)   Hunger Vital Sign    Worried About Running Out of Food in the Last Year: Never true    Ran Out of Food in the Last Year: Never true  Transportation Needs: Unmet Transportation Needs (09/10/2022)   PRAPARE - Administrator, Civil Service (Medical): Yes    Lack of Transportation  (Non-Medical): Yes  Physical Activity: Not on file  Stress: Not on file  Social Connections: Not on file     Family History: The patient's ***family history includes Arthritis in his father, mother, paternal grandfather, and paternal grandmother; Depression in his maternal grandmother, mother, and sister; Diabetes in his father, maternal grandmother, mother, sister, and sister; Hearing loss in his father and paternal grandmother; Heart attack in his father and paternal grandfather; Heart disease in his father, maternal grandmother, mother, and paternal grandfather; Hyperlipidemia in his father, maternal grandfather, and maternal grandmother; Hypertension in his father, maternal grandfather, maternal grandmother, mother, and sister; Learning disabilities in his mother; Melanoma in an other family member; Mental illness in his mother and sister; Sleep apnea in his mother and sister; Stroke in his father, maternal grandfather, maternal grandmother, mother, and paternal grandfather.  ROS:   Please see the history of present illness.    *** All other systems reviewed and are negative.  EKGs/Labs/Other Studies Reviewed:    The following studies were reviewed today: ***  EKG:  EKG is *** ordered today.  The ekg ordered today demonstrates ***  Recent Labs: 11/22/2021: TSH 0.842 11/24/2021: Magnesium 2.1 11/28/2021: NT-Pro BNP 966 05/18/2022: B Natriuretic Peptide 174.3 09/08/2022: ALT 10 09/10/2022: BUN 20; Creatinine, Ser 1.55; Potassium 4.0; Sodium 130 09/19/2022: Hemoglobin 9.3; Platelet Count 369  Recent Lipid Panel    Component Value Date/Time   CHOL 106 01/16/2022 0518   CHOL 154 05/19/2020 1139   TRIG 104 01/16/2022 0518   HDL 24 (L) 01/16/2022 0518   HDL 39 (L) 05/19/2020 1139   CHOLHDL 4.4 01/16/2022 0518   VLDL 21 01/16/2022 0518   LDLCALC 61 01/16/2022 0518   LDLCALC 100 (Vance) 05/19/2020 1139     Risk Assessment/Calculations:   {Does this patient have ATRIAL  FIBRILLATION?:[id number]}   Physical Exam:    VS:  There were no vitals taken for this visit.    Wt Readings from Last 3 Encounters:  09/19/22 241 lb (109.3 kg)  09/11/22 248 lb (112.5 kg)  09/08/22 268 lb 15.4 oz (122 kg)     GEN: *** Well nourished, well developed in no acute distress HEENT: Normal NECK: No JVD; No carotid bruits LYMPHATICS: No lymphadenopathy CARDIAC: ***RRR, no murmurs, rubs, gallops RESPIRATORY:  Clear to auscultation without rales, wheezing or rhonchi  ABDOMEN: Soft, non-tender, non-distended MUSCULOSKELETAL:  No edema; No deformity  SKIN: Warm and dry NEUROLOGIC:  Alert and oriented x 3 PSYCHIATRIC:  Normal affect   ASSESSMENT:    No diagnosis found. PLAN:    In order of problems listed above:  ***  Disposition: Follow up {follow up:15908} with ***   Shared Decision Making/Informed Consent   {Are you ordering a CV Procedure (e.g. stress test, cath, DCCV, TEE, etc)?   Press F2        :[id number]}    Signed, Amiya Escamilla David Stall, PA-C  09/24/2022 10:01 AM    [facility]

## 2022-09-24 NOTE — Telephone Encounter (Signed)
Received FMLA for patient sister who is his guardian and caretaker. Form completed and will be sent for doctor signature

## 2022-09-25 ENCOUNTER — Inpatient Hospital Stay: Payer: Medicaid Other

## 2022-09-25 ENCOUNTER — Other Ambulatory Visit: Payer: Self-pay

## 2022-09-25 ENCOUNTER — Encounter: Payer: Self-pay | Admitting: Medical

## 2022-09-25 ENCOUNTER — Ambulatory Visit: Payer: Medicaid Other

## 2022-09-25 ENCOUNTER — Inpatient Hospital Stay: Payer: Medicaid Other | Admitting: Oncology

## 2022-09-25 ENCOUNTER — Ambulatory Visit
Admission: RE | Admit: 2022-09-25 | Discharge: 2022-09-25 | Disposition: A | Discharge status: Home / Self Care | Payer: Medicaid Other | Attending: Vascular Surgery | Admitting: Vascular Surgery

## 2022-09-25 ENCOUNTER — Encounter: Payer: Self-pay | Admitting: Vascular Surgery

## 2022-09-25 ENCOUNTER — Encounter
Admission: RE | Disposition: A | Discharge status: Home / Self Care | Payer: Self-pay | Source: Home / Self Care | Attending: Vascular Surgery

## 2022-09-25 DIAGNOSIS — Z7901 Long term (current) use of anticoagulants: Secondary | ICD-10-CM | POA: Diagnosis not present

## 2022-09-25 DIAGNOSIS — R1310 Dysphagia, unspecified: Secondary | ICD-10-CM | POA: Insufficient documentation

## 2022-09-25 DIAGNOSIS — F419 Anxiety disorder, unspecified: Secondary | ICD-10-CM | POA: Insufficient documentation

## 2022-09-25 DIAGNOSIS — C160 Malignant neoplasm of cardia: Secondary | ICD-10-CM | POA: Diagnosis not present

## 2022-09-25 DIAGNOSIS — C169 Malignant neoplasm of stomach, unspecified: Secondary | ICD-10-CM | POA: Diagnosis not present

## 2022-09-25 DIAGNOSIS — I509 Heart failure, unspecified: Secondary | ICD-10-CM | POA: Diagnosis not present

## 2022-09-25 DIAGNOSIS — Z79899 Other long term (current) drug therapy: Secondary | ICD-10-CM | POA: Diagnosis not present

## 2022-09-25 DIAGNOSIS — E1122 Type 2 diabetes mellitus with diabetic chronic kidney disease: Secondary | ICD-10-CM | POA: Diagnosis not present

## 2022-09-25 DIAGNOSIS — N1831 Chronic kidney disease, stage 3a: Secondary | ICD-10-CM | POA: Diagnosis not present

## 2022-09-25 DIAGNOSIS — D509 Iron deficiency anemia, unspecified: Secondary | ICD-10-CM | POA: Diagnosis not present

## 2022-09-25 DIAGNOSIS — Z7189 Other specified counseling: Secondary | ICD-10-CM | POA: Diagnosis not present

## 2022-09-25 DIAGNOSIS — I130 Hypertensive heart and chronic kidney disease with heart failure and stage 1 through stage 4 chronic kidney disease, or unspecified chronic kidney disease: Secondary | ICD-10-CM | POA: Diagnosis not present

## 2022-09-25 HISTORY — PX: PORTA CATH INSERTION: CATH118285

## 2022-09-25 LAB — GLUCOSE, CAPILLARY: Glucose-Capillary: 141 mg/dL — ABNORMAL HIGH (ref 70–99)

## 2022-09-25 SURGERY — PORTA CATH INSERTION
Anesthesia: Moderate Sedation

## 2022-09-25 MED ORDER — FENTANYL CITRATE (PF) 100 MCG/2ML IJ SOLN
INTRAMUSCULAR | Status: AC
  Filled 2022-09-25: qty 2

## 2022-09-25 MED ORDER — FAMOTIDINE 20 MG PO TABS: 40.0000 mg | ORAL_TABLET | Freq: Once | ORAL | Status: DC | PRN

## 2022-09-25 MED ORDER — MIDAZOLAM HCL 2 MG/2ML IJ SOLN
INTRAMUSCULAR | Status: DC | PRN
  Administered 2022-09-25: 2 mg via INTRAVENOUS

## 2022-09-25 MED ORDER — CEFAZOLIN SODIUM-DEXTROSE 2-4 GM/100ML-% IV SOLN
2.0000 g | INTRAVENOUS | Status: AC
  Administered 2022-09-25: 2 g via INTRAVENOUS

## 2022-09-25 MED ORDER — SODIUM CHLORIDE 0.9 % IV SOLN: INTRAVENOUS | Status: DC

## 2022-09-25 MED ORDER — HEPARIN (PORCINE) IN NACL 1000-0.9 UT/500ML-% IV SOLN
INTRAVENOUS | Status: DC | PRN
  Administered 2022-09-25: 500 mL

## 2022-09-25 MED ORDER — MIDAZOLAM HCL 2 MG/2ML IJ SOLN
INTRAMUSCULAR | Status: AC
  Filled 2022-09-25: qty 2

## 2022-09-25 MED ORDER — MIDAZOLAM HCL 2 MG/ML PO SYRP: 8.0000 mg | ORAL_SOLUTION | Freq: Once | ORAL | Status: DC | PRN

## 2022-09-25 MED ORDER — DIPHENHYDRAMINE HCL 50 MG/ML IJ SOLN: 50.0000 mg | Freq: Once | INTRAMUSCULAR | Status: DC | PRN

## 2022-09-25 MED ORDER — METHYLPREDNISOLONE SODIUM SUCC 125 MG IJ SOLR: 125.0000 mg | Freq: Once | INTRAMUSCULAR | Status: DC | PRN

## 2022-09-25 MED ORDER — CEFAZOLIN SODIUM-DEXTROSE 2-4 GM/100ML-% IV SOLN
INTRAVENOUS | Status: AC
  Filled 2022-09-25: qty 100

## 2022-09-25 MED ORDER — SODIUM CHLORIDE 0.9 % IV SOLN
80.0000 mg | Freq: Once | INTRAVENOUS | Status: AC
  Administered 2022-09-25: 80 mg
  Filled 2022-09-25: qty 2

## 2022-09-25 MED ORDER — HYDROMORPHONE HCL 1 MG/ML IJ SOLN: 1.0000 mg | Freq: Once | INTRAMUSCULAR | Status: DC | PRN

## 2022-09-25 MED ORDER — FENTANYL CITRATE (PF) 100 MCG/2ML IJ SOLN
INTRAMUSCULAR | Status: DC | PRN
  Administered 2022-09-25: 50 ug via INTRAVENOUS

## 2022-09-25 MED ORDER — ONDANSETRON HCL 4 MG/2ML IJ SOLN: 4.0000 mg | Freq: Four times a day (QID) | INTRAMUSCULAR | Status: DC | PRN

## 2022-09-25 SURGICAL SUPPLY — 9 items
ADH SKN CLS APL DERMABOND .7 (GAUZE/BANDAGES/DRESSINGS) ×1
DERMABOND ADVANCED .7 DNX12 (GAUZE/BANDAGES/DRESSINGS) IMPLANT
KIT PORT POWER 8FR ALT SEPTUM (Port) IMPLANT
PACK ANGIOGRAPHY (CUSTOM PROCEDURE TRAY) ×1 IMPLANT
PENCIL ELECTRO HAND CTR (MISCELLANEOUS) IMPLANT
SUT MNCRL AB 4-0 PS2 18 (SUTURE) IMPLANT
SUT VIC AB 3-0 SH 27 (SUTURE) ×1
SUT VIC AB 3-0 SH 27X BRD (SUTURE) IMPLANT
TUBING CONNECTING 10 (TUBING) IMPLANT

## 2022-09-25 NOTE — Op Note (Signed)
      [facility]       Operative Note  Date: 09/25/2022  Preoperative diagnosis:  1. Gastric cancer  Postoperative diagnosis:  Same as above  Procedures: #1. Ultrasound guidance for vascular access to the right internal jugular vein. #2. Fluoroscopic guidance for placement of catheter. #3. Placement of CT compatible Port-A-Cath, right internal jugular vein.  Surgeon: Festus Barren, MD.   Anesthesia: Local with moderate conscious sedation for approximately 25  minutes using 2 mg of Versed and 50 mcg of Fentanyl  Fluoroscopy time: less than 1 minute  Contrast used: 0  Estimated blood loss: 3 cc  Indication for the procedure:  The patient is a 52 y.o.male with gastric cancer.  The patient needs a Port-A-Cath for durable venous access, chemotherapy, lab draws, and CT scans. We are asked to place this. Risks and benefits were discussed and informed consent was obtained.  Description of procedure: The patient was brought to the vascular and interventional radiology suite.  Moderate conscious sedation was administered throughout the procedure during a face to face encounter with the patient with my supervision of the RN administering medicines and monitoring the patient's vital signs, pulse oximetry, telemetry and mental status throughout from the start of the procedure until the patient was taken to the recovery room. The right neck chest and shoulder were sterilely prepped and draped, and a sterile surgical field was created. Ultrasound was used to help visualize a patent right internal jugular vein. This was then accessed under direct ultrasound guidance without difficulty with the Seldinger needle and a permanent image was recorded. A J-wire was placed. After skin nick and dilatation, the peel-away sheath was then placed over the wire. I then anesthetized an area under the clavicle approximately 1-2 fingerbreadths. A transverse incision was created and an inferior pocket was  created with electrocautery and blunt dissection. The port was then brought onto the field, placed into the pocket and secured to the chest wall with 2 Prolene sutures. The catheter was connected to the port and tunneled from the subclavicular incision to the access site. Fluoroscopic guidance was then used to cut the catheter to an appropriate length. The catheter was then placed through the peel-away sheath and the peel-away sheath was removed. The catheter tip was parked in excellent location under fluorocoscopic guidance in the cavoatrial junction. The pocket was then irrigated with antibiotic impregnated saline and the wound was closed with a running 3-0 Vicryl and a 4-0 Monocryl. The access incision was closed with a single 4-0 Monocryl. The Huber needle was used to withdraw blood and flush the port with heparinized saline. Dermabond was then placed as a dressing. The patient tolerated the procedure well and was taken to the recovery room in stable condition.   Festus Barren 09/25/2022 12:11 PM   This note was created with Dragon Medical transcription system. Any errors in dictation are purely unintentional.

## 2022-09-25 NOTE — Interval H&P Note (Signed)
History and Physical Interval Note:  09/25/2022 10:21 AM  Jeremiah Vance  has presented today for surgery, with the diagnosis of Porta Cath Placement   Gastric Ca.  The various methods of treatment have been discussed with the patient and family. After consideration of risks, benefits and other options for treatment, the patient has consented to  Procedure(s): PORTA CATH INSERTION (N/A) as a surgical intervention.  The patient's history has been reviewed, patient examined, no change in status, stable for surgery.  I have reviewed the patient's chart and labs.  Questions were answered to the patient's satisfaction.     Festus Barren

## 2022-09-26 ENCOUNTER — Telehealth: Payer: Self-pay

## 2022-09-26 ENCOUNTER — Encounter: Payer: Self-pay | Admitting: Vascular Surgery

## 2022-09-26 ENCOUNTER — Inpatient Hospital Stay: Payer: Medicaid Other

## 2022-09-26 ENCOUNTER — Inpatient Hospital Stay: Payer: Medicaid Other | Admitting: Hospice and Palliative Medicine

## 2022-09-26 ENCOUNTER — Ambulatory Visit: Payer: Medicaid Other

## 2022-09-26 ENCOUNTER — Telehealth: Payer: Self-pay | Admitting: *Deleted

## 2022-09-26 MED FILL — Fluorouracil IV Soln 5 GM/100ML (50 MG/ML): INTRAVENOUS | Qty: 111 | Status: AC

## 2022-09-26 MED FILL — Fluorouracil IV Soln 2.5 GM/50ML (50 MG/ML): INTRAVENOUS | Qty: 20 | Status: AC

## 2022-09-26 MED FILL — Dexamethasone Sodium Phosphate Inj 100 MG/10ML: INTRAMUSCULAR | Qty: 1 | Status: AC

## 2022-09-26 NOTE — Telephone Encounter (Signed)
CSW attempted to contact patient to address depression.  Left vm.

## 2022-09-26 NOTE — Telephone Encounter (Signed)
The patient called on our triage and said call me back.  I looked in the computer and the only person that had left a message was the social worker Larita Fife Duffy.  I sent a secure chat and told her that I feel like he was wanting to talk to you because no one else had called him today and she said that she will get back in touch with

## 2022-09-26 NOTE — Progress Notes (Signed)
[facility] Clinical Social Work  Clinical Social Work was referred by nurse for assessment of psychosocial needs.  Clinical Social Worker contacted patient by phone to offer support and assess for needs.    Patient stated he currently did not have a therapist and has depression.  Provided information regarding CSW role.  CSW to meet with patient tomorrow, 7/28, during infusion to provide counseling.  Patient stated he understood.     Dorothey Baseman, LCSW  Clinical Social Worker [facility]        Patient is participating in a Managed Medicaid Plan:  Yes

## 2022-09-27 ENCOUNTER — Inpatient Hospital Stay (HOSPITAL_BASED_OUTPATIENT_CLINIC_OR_DEPARTMENT_OTHER): Payer: Medicaid Other | Admitting: Hospice and Palliative Medicine

## 2022-09-27 ENCOUNTER — Inpatient Hospital Stay: Payer: Medicaid Other

## 2022-09-27 ENCOUNTER — Inpatient Hospital Stay (HOSPITAL_BASED_OUTPATIENT_CLINIC_OR_DEPARTMENT_OTHER): Payer: Medicaid Other | Admitting: Oncology

## 2022-09-27 ENCOUNTER — Encounter: Payer: Self-pay | Admitting: Oncology

## 2022-09-27 ENCOUNTER — Encounter: Payer: Self-pay | Admitting: Internal Medicine

## 2022-09-27 ENCOUNTER — Inpatient Hospital Stay
Admission: EM | Admit: 2022-09-27 | Discharge: 2022-09-29 | DRG: 375 | Disposition: A | Discharge status: Home / Self Care | Payer: Medicaid Other | Source: Ambulatory Visit | Attending: Osteopathic Medicine | Admitting: Osteopathic Medicine

## 2022-09-27 ENCOUNTER — Other Ambulatory Visit: Payer: Self-pay

## 2022-09-27 VITALS — BP 119/72 | HR 112 | Temp 96.2°F | Resp 18 | Ht 70.0 in | Wt 237.4 lb

## 2022-09-27 DIAGNOSIS — F419 Anxiety disorder, unspecified: Secondary | ICD-10-CM

## 2022-09-27 DIAGNOSIS — D500 Iron deficiency anemia secondary to blood loss (chronic): Secondary | ICD-10-CM | POA: Diagnosis not present

## 2022-09-27 DIAGNOSIS — Z515 Encounter for palliative care: Secondary | ICD-10-CM

## 2022-09-27 DIAGNOSIS — I10 Essential (primary) hypertension: Secondary | ICD-10-CM

## 2022-09-27 DIAGNOSIS — Z833 Family history of diabetes mellitus: Secondary | ICD-10-CM

## 2022-09-27 DIAGNOSIS — N184 Chronic kidney disease, stage 4 (severe): Secondary | ICD-10-CM | POA: Diagnosis not present

## 2022-09-27 DIAGNOSIS — Z882 Allergy status to sulfonamides status: Secondary | ICD-10-CM

## 2022-09-27 DIAGNOSIS — Z822 Family history of deafness and hearing loss: Secondary | ICD-10-CM

## 2022-09-27 DIAGNOSIS — K922 Gastrointestinal hemorrhage, unspecified: Secondary | ICD-10-CM | POA: Diagnosis not present

## 2022-09-27 DIAGNOSIS — R634 Abnormal weight loss: Secondary | ICD-10-CM | POA: Diagnosis not present

## 2022-09-27 DIAGNOSIS — C160 Malignant neoplasm of cardia: Secondary | ICD-10-CM | POA: Diagnosis not present

## 2022-09-27 DIAGNOSIS — Z7901 Long term (current) use of anticoagulants: Secondary | ICD-10-CM | POA: Diagnosis not present

## 2022-09-27 DIAGNOSIS — M109 Gout, unspecified: Secondary | ICD-10-CM | POA: Diagnosis present

## 2022-09-27 DIAGNOSIS — D62 Acute posthemorrhagic anemia: Secondary | ICD-10-CM | POA: Diagnosis present

## 2022-09-27 DIAGNOSIS — N1832 Chronic kidney disease, stage 3b: Secondary | ICD-10-CM | POA: Diagnosis not present

## 2022-09-27 DIAGNOSIS — I509 Heart failure, unspecified: Secondary | ICD-10-CM | POA: Diagnosis not present

## 2022-09-27 DIAGNOSIS — Z8673 Personal history of transient ischemic attack (TIA), and cerebral infarction without residual deficits: Secondary | ICD-10-CM

## 2022-09-27 DIAGNOSIS — I130 Hypertensive heart and chronic kidney disease with heart failure and stage 1 through stage 4 chronic kidney disease, or unspecified chronic kidney disease: Secondary | ICD-10-CM | POA: Diagnosis present

## 2022-09-27 DIAGNOSIS — R112 Nausea with vomiting, unspecified: Secondary | ICD-10-CM | POA: Diagnosis not present

## 2022-09-27 DIAGNOSIS — I959 Hypotension, unspecified: Secondary | ICD-10-CM | POA: Diagnosis present

## 2022-09-27 DIAGNOSIS — D509 Iron deficiency anemia, unspecified: Secondary | ICD-10-CM | POA: Diagnosis not present

## 2022-09-27 DIAGNOSIS — I482 Chronic atrial fibrillation, unspecified: Secondary | ICD-10-CM | POA: Diagnosis present

## 2022-09-27 DIAGNOSIS — F32A Depression, unspecified: Secondary | ICD-10-CM

## 2022-09-27 DIAGNOSIS — I5023 Acute on chronic systolic (congestive) heart failure: Secondary | ICD-10-CM | POA: Diagnosis present

## 2022-09-27 DIAGNOSIS — F411 Generalized anxiety disorder: Secondary | ICD-10-CM

## 2022-09-27 DIAGNOSIS — Z6834 Body mass index (BMI) 34.0-34.9, adult: Secondary | ICD-10-CM

## 2022-09-27 DIAGNOSIS — I5A Non-ischemic myocardial injury (non-traumatic): Secondary | ICD-10-CM

## 2022-09-27 DIAGNOSIS — D649 Anemia, unspecified: Secondary | ICD-10-CM

## 2022-09-27 DIAGNOSIS — K7469 Other cirrhosis of liver: Secondary | ICD-10-CM | POA: Diagnosis not present

## 2022-09-27 DIAGNOSIS — G4730 Sleep apnea, unspecified: Secondary | ICD-10-CM | POA: Diagnosis present

## 2022-09-27 DIAGNOSIS — Z83438 Family history of other disorder of lipoprotein metabolism and other lipidemia: Secondary | ICD-10-CM

## 2022-09-27 DIAGNOSIS — E785 Hyperlipidemia, unspecified: Secondary | ICD-10-CM | POA: Diagnosis not present

## 2022-09-27 DIAGNOSIS — R531 Weakness: Secondary | ICD-10-CM | POA: Diagnosis not present

## 2022-09-27 DIAGNOSIS — I2510 Atherosclerotic heart disease of native coronary artery without angina pectoris: Secondary | ICD-10-CM | POA: Diagnosis present

## 2022-09-27 DIAGNOSIS — I5032 Chronic diastolic (congestive) heart failure: Secondary | ICD-10-CM

## 2022-09-27 DIAGNOSIS — I639 Cerebral infarction, unspecified: Secondary | ICD-10-CM | POA: Diagnosis present

## 2022-09-27 DIAGNOSIS — R110 Nausea: Secondary | ICD-10-CM | POA: Diagnosis not present

## 2022-09-27 DIAGNOSIS — Z808 Family history of malignant neoplasm of other organs or systems: Secondary | ICD-10-CM

## 2022-09-27 DIAGNOSIS — Z8261 Family history of arthritis: Secondary | ICD-10-CM

## 2022-09-27 DIAGNOSIS — I5043 Acute on chronic combined systolic (congestive) and diastolic (congestive) heart failure: Secondary | ICD-10-CM | POA: Diagnosis present

## 2022-09-27 DIAGNOSIS — C801 Malignant (primary) neoplasm, unspecified: Secondary | ICD-10-CM

## 2022-09-27 DIAGNOSIS — I5042 Chronic combined systolic (congestive) and diastolic (congestive) heart failure: Secondary | ICD-10-CM | POA: Diagnosis not present

## 2022-09-27 DIAGNOSIS — Z818 Family history of other mental and behavioral disorders: Secondary | ICD-10-CM

## 2022-09-27 DIAGNOSIS — I480 Paroxysmal atrial fibrillation: Secondary | ICD-10-CM

## 2022-09-27 DIAGNOSIS — K222 Esophageal obstruction: Secondary | ICD-10-CM | POA: Diagnosis present

## 2022-09-27 DIAGNOSIS — E1129 Type 2 diabetes mellitus with other diabetic kidney complication: Secondary | ICD-10-CM | POA: Diagnosis present

## 2022-09-27 DIAGNOSIS — E7800 Pure hypercholesterolemia, unspecified: Secondary | ICD-10-CM | POA: Diagnosis present

## 2022-09-27 DIAGNOSIS — N179 Acute kidney failure, unspecified: Secondary | ICD-10-CM | POA: Diagnosis present

## 2022-09-27 DIAGNOSIS — I129 Hypertensive chronic kidney disease with stage 1 through stage 4 chronic kidney disease, or unspecified chronic kidney disease: Secondary | ICD-10-CM | POA: Diagnosis not present

## 2022-09-27 DIAGNOSIS — Z7984 Long term (current) use of oral hypoglycemic drugs: Secondary | ICD-10-CM

## 2022-09-27 DIAGNOSIS — K2289 Other specified disease of esophagus: Secondary | ICD-10-CM

## 2022-09-27 DIAGNOSIS — E1122 Type 2 diabetes mellitus with diabetic chronic kidney disease: Secondary | ICD-10-CM | POA: Diagnosis present

## 2022-09-27 DIAGNOSIS — E669 Obesity, unspecified: Secondary | ICD-10-CM | POA: Diagnosis present

## 2022-09-27 DIAGNOSIS — Z79899 Other long term (current) drug therapy: Secondary | ICD-10-CM

## 2022-09-27 DIAGNOSIS — Z823 Family history of stroke: Secondary | ICD-10-CM

## 2022-09-27 DIAGNOSIS — Z8249 Family history of ischemic heart disease and other diseases of the circulatory system: Secondary | ICD-10-CM

## 2022-09-27 LAB — CBC WITH DIFFERENTIAL (CANCER CENTER ONLY)
Abs Immature Granulocytes: 0.03 10*3/uL (ref 0.00–0.07)
Basophils Absolute: 0 10*3/uL (ref 0.0–0.1)
Basophils Relative: 1 %
Eosinophils Absolute: 0.2 10*3/uL (ref 0.0–0.5)
Eosinophils Relative: 3 %
HCT: 23.9 % — ABNORMAL LOW (ref 39.0–52.0)
Hemoglobin: 7.3 g/dL — ABNORMAL LOW (ref 13.0–17.0)
Immature Granulocytes: 0 %
Lymphocytes Relative: 13 %
Lymphs Abs: 0.9 10*3/uL (ref 0.7–4.0)
MCH: 25 pg — ABNORMAL LOW (ref 26.0–34.0)
MCHC: 30.5 g/dL (ref 30.0–36.0)
MCV: 81.8 fL (ref 80.0–100.0)
Monocytes Absolute: 0.6 10*3/uL (ref 0.1–1.0)
Monocytes Relative: 9 %
Neutro Abs: 5 10*3/uL (ref 1.7–7.7)
Neutrophils Relative %: 74 %
Platelet Count: 318 10*3/uL (ref 150–400)
RBC: 2.92 MIL/uL — ABNORMAL LOW (ref 4.22–5.81)
RDW: 17.8 % — ABNORMAL HIGH (ref 11.5–15.5)
WBC Count: 6.9 10*3/uL (ref 4.0–10.5)
nRBC: 0 % (ref 0.0–0.2)

## 2022-09-27 LAB — CBC
HCT: 24.5 % — ABNORMAL LOW (ref 39.0–52.0)
HCT: 25.7 % — ABNORMAL LOW (ref 39.0–52.0)
Hemoglobin: 7.2 g/dL — ABNORMAL LOW (ref 13.0–17.0)
Hemoglobin: 7.8 g/dL — ABNORMAL LOW (ref 13.0–17.0)
MCH: 24.7 pg — ABNORMAL LOW (ref 26.0–34.0)
MCH: 24.9 pg — ABNORMAL LOW (ref 26.0–34.0)
MCHC: 29.4 g/dL — ABNORMAL LOW (ref 30.0–36.0)
MCHC: 30.4 g/dL (ref 30.0–36.0)
MCV: 84.2 fL (ref 80.0–100.0)
MCV: 82.1 fL (ref 80.0–100.0)
Platelets: 299 10*3/uL (ref 150–400)
Platelets: 250 10*3/uL (ref 150–400)
RBC: 2.91 MIL/uL — ABNORMAL LOW (ref 4.22–5.81)
RBC: 3.13 MIL/uL — ABNORMAL LOW (ref 4.22–5.81)
RDW: 18.2 % — ABNORMAL HIGH (ref 11.5–15.5)
RDW: 17.4 % — ABNORMAL HIGH (ref 11.5–15.5)
WBC: 6.9 10*3/uL (ref 4.0–10.5)
WBC: 7.1 10*3/uL (ref 4.0–10.5)
nRBC: 0 % (ref 0.0–0.2)
nRBC: 0 % (ref 0.0–0.2)

## 2022-09-27 LAB — BASIC METABOLIC PANEL
Anion gap: 11 (ref 5–15)
BUN: 42 mg/dL — ABNORMAL HIGH (ref 6–20)
CO2: 23 mmol/L (ref 22–32)
Calcium: 8.9 mg/dL (ref 8.9–10.3)
Chloride: 103 mmol/L (ref 98–111)
Creatinine, Ser: 1.9 mg/dL — ABNORMAL HIGH (ref 0.61–1.24)
GFR, Estimated: 42 mL/min — ABNORMAL LOW (ref >60)
Glucose, Bld: 194 mg/dL — ABNORMAL HIGH (ref 70–99)
Potassium: 5 mmol/L (ref 3.5–5.1)
Sodium: 137 mmol/L (ref 135–145)

## 2022-09-27 LAB — CMP (CANCER CENTER ONLY)
ALT: 11 U/L (ref 0–44)
AST: 37 U/L (ref 15–41)
Albumin: 2.9 g/dL — ABNORMAL LOW (ref 3.5–5.0)
Alkaline Phosphatase: 64 U/L (ref 38–126)
Anion gap: 10 (ref 5–15)
BUN: 41 mg/dL — ABNORMAL HIGH (ref 6–20)
CO2: 23 mmol/L (ref 22–32)
Calcium: 8.5 mg/dL — ABNORMAL LOW (ref 8.9–10.3)
Chloride: 100 mmol/L (ref 98–111)
Creatinine: 1.78 mg/dL — ABNORMAL HIGH (ref 0.61–1.24)
GFR, Estimated: 45 mL/min — ABNORMAL LOW (ref >60)
Glucose, Bld: 228 mg/dL — ABNORMAL HIGH (ref 70–99)
Potassium: 4.4 mmol/L (ref 3.5–5.1)
Sodium: 133 mmol/L — ABNORMAL LOW (ref 135–145)
Total Bilirubin: 0.6 mg/dL (ref 0.3–1.2)
Total Protein: 7.1 g/dL (ref 6.5–8.1)

## 2022-09-27 LAB — TYPE AND SCREEN
Antibody Screen: NEGATIVE
Unit division: 0

## 2022-09-27 LAB — GLUCOSE, CAPILLARY
Glucose-Capillary: 104 mg/dL — ABNORMAL HIGH (ref 70–99)
Glucose-Capillary: 111 mg/dL — ABNORMAL HIGH (ref 70–99)

## 2022-09-27 LAB — URINALYSIS, ROUTINE W REFLEX MICROSCOPIC
Bilirubin Urine: NEGATIVE
Glucose, UA: NEGATIVE mg/dL
Hgb urine dipstick: NEGATIVE
Ketones, ur: NEGATIVE mg/dL
Nitrite: NEGATIVE
Protein, ur: NEGATIVE mg/dL
Specific Gravity, Urine: 1.006 (ref 1.005–1.030)
pH: 6 (ref 5.0–8.0)

## 2022-09-27 LAB — LIPID PANEL
Cholesterol: 167 mg/dL (ref 0–200)
HDL: 24 mg/dL — ABNORMAL LOW (ref >40)
LDL Cholesterol: 115 mg/dL — ABNORMAL HIGH (ref 0–99)
Total CHOL/HDL Ratio: 7 RATIO
Triglycerides: 139 mg/dL (ref <150)
VLDL: 28 mg/dL (ref 0–40)

## 2022-09-27 LAB — BPAM RBC: ISSUE DATE / TIME: 202406281234

## 2022-09-27 LAB — TROPONIN I (HIGH SENSITIVITY)
Troponin I (High Sensitivity): 41 ng/L — ABNORMAL HIGH (ref <18)
Troponin I (High Sensitivity): 16 ng/L (ref <18)

## 2022-09-27 LAB — PROTIME-INR
INR: 1.4 — ABNORMAL HIGH (ref 0.8–1.2)
Prothrombin Time: 17 seconds — ABNORMAL HIGH (ref 11.4–15.2)

## 2022-09-27 LAB — MAGNESIUM: Magnesium: 2.4 mg/dL (ref 1.7–2.4)

## 2022-09-27 LAB — BRAIN NATRIURETIC PEPTIDE: B Natriuretic Peptide: 271.9 pg/mL — ABNORMAL HIGH (ref 0.0–100.0)

## 2022-09-27 LAB — PREPARE RBC (CROSSMATCH)

## 2022-09-27 LAB — APTT: aPTT: 38 seconds — ABNORMAL HIGH (ref 24–36)

## 2022-09-27 MED ORDER — LORAZEPAM 0.5 MG PO TABS: 0.5000 mg | ORAL_TABLET | Freq: Four times a day (QID) | ORAL | Status: DC | PRN

## 2022-09-27 MED ORDER — NYSTATIN 100000 UNIT/GM EX POWD
Freq: Two times a day (BID) | CUTANEOUS | Status: DC
  Filled 2022-09-27: qty 15

## 2022-09-27 MED ORDER — LORAZEPAM 2 MG/ML IJ SOLN: 0.5000 mg | Freq: Once | INTRAMUSCULAR | Status: DC

## 2022-09-27 MED ORDER — TORSEMIDE 20 MG PO TABS
40.0000 mg | ORAL_TABLET | Freq: Every day | ORAL | Status: DC
  Administered 2022-09-28 – 2022-09-29 (×2): 40 mg via ORAL
  Filled 2022-09-27: qty 2

## 2022-09-27 MED ORDER — BUSPIRONE HCL 15 MG PO TABS
7.5000 mg | ORAL_TABLET | Freq: Two times a day (BID) | ORAL | Status: DC
  Administered 2022-09-27 – 2022-09-29 (×4): 7.5 mg via ORAL
  Filled 2022-09-27 (×4): qty 1

## 2022-09-27 MED ORDER — INSULIN ASPART 100 UNIT/ML IJ SOLN
0.0000 [IU] | Freq: Three times a day (TID) | INTRAMUSCULAR | Status: DC
  Administered 2022-09-28 – 2022-09-29 (×2): 2 [IU] via SUBCUTANEOUS
  Filled 2022-09-27 (×2): qty 1

## 2022-09-27 MED ORDER — SODIUM CHLORIDE 0.9 % IV SOLN
200.0000 mg | INTRAVENOUS | Status: AC
  Administered 2022-09-27 – 2022-09-28 (×2): 200 mg via INTRAVENOUS
  Filled 2022-09-27: qty 10
  Filled 2022-09-27: qty 200

## 2022-09-27 MED ORDER — HYDRALAZINE HCL 20 MG/ML IJ SOLN: 5.0000 mg | INTRAMUSCULAR | Status: DC | PRN

## 2022-09-27 MED ORDER — ATORVASTATIN CALCIUM 20 MG PO TABS
40.0000 mg | ORAL_TABLET | Freq: Every day | ORAL | Status: DC
  Administered 2022-09-27 – 2022-09-28 (×2): 40 mg via ORAL
  Filled 2022-09-27 (×3): qty 2

## 2022-09-27 MED ORDER — PANTOPRAZOLE SODIUM 40 MG IV SOLR
40.0000 mg | Freq: Two times a day (BID) | INTRAVENOUS | Status: DC
  Administered 2022-09-27 – 2022-09-29 (×4): 40 mg via INTRAVENOUS
  Filled 2022-09-27 (×3): qty 10

## 2022-09-27 MED ORDER — FUROSEMIDE 10 MG/ML IJ SOLN
40.0000 mg | Freq: Once | INTRAMUSCULAR | Status: AC
  Administered 2022-09-27: 40 mg via INTRAVENOUS
  Filled 2022-09-27: qty 4

## 2022-09-27 MED ORDER — ACETAMINOPHEN 325 MG PO TABS: 650.0000 mg | ORAL_TABLET | Freq: Four times a day (QID) | ORAL | Status: DC | PRN

## 2022-09-27 MED ORDER — ONDANSETRON HCL 4 MG/2ML IJ SOLN
4.0000 mg | Freq: Three times a day (TID) | INTRAMUSCULAR | Status: DC | PRN
  Administered 2022-09-29: 4 mg via INTRAVENOUS
  Filled 2022-09-27: qty 2

## 2022-09-27 MED ORDER — OXYCODONE HCL 5 MG PO TABS: 5.0000 mg | ORAL_TABLET | Freq: Three times a day (TID) | ORAL | Status: DC | PRN

## 2022-09-27 MED ORDER — PANTOPRAZOLE SODIUM 40 MG IV SOLR
40.0000 mg | Freq: Once | INTRAVENOUS | Status: AC
  Administered 2022-09-27: 40 mg via INTRAVENOUS
  Filled 2022-09-27: qty 10

## 2022-09-27 MED ORDER — BOOST / RESOURCE BREEZE PO LIQD CUSTOM
1.0000 | Freq: Three times a day (TID) | ORAL | Status: DC
  Administered 2022-09-27 – 2022-09-29 (×4): 1 via ORAL

## 2022-09-27 MED ORDER — INSULIN ASPART 100 UNIT/ML IJ SOLN: 0.0000 [IU] | Freq: Every day | INTRAMUSCULAR | Status: DC

## 2022-09-27 MED ORDER — SODIUM CHLORIDE 0.9 % IV SOLN
10.0000 mL/h | Freq: Once | INTRAVENOUS | Status: AC
  Administered 2022-09-27: 10 mL/h via INTRAVENOUS

## 2022-09-27 MED ORDER — SODIUM CHLORIDE 0.9 % IV SOLN: INTRAVENOUS | Status: DC

## 2022-09-27 MED FILL — Iron Sucrose Inj 20 MG/ML (Fe Equiv): INTRAVENOUS | Qty: 10 | Status: AC

## 2022-09-27 NOTE — Consult Note (Signed)
  Inpatient Consultation   Patient ID: Jeremiah Vance is a 52 y.o. male.  Requesting Provider: Dr. 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  GI is consulted for anemia.  His sister is at bedside who helps provide history.  Hemoglobin 7.3 on presentation from 9.3  8 days ago.  BUN 42 creatinine 1.9.  The patient denies hematochezia melena hematemesis or coffee-ground emesis.  He denies abdominal pain.  Sister reports he has had brown bowel movements.  She also notes the patient has been eating normally.  He is scheduled to undergo palliative chemotherapy but presented to the hospital due to the symptoms.  He is receiving 1 unit PRBC in the emergency department  Patient recently had port placed on 09/25/22.  His last dose of Eliquis was Tuesday evening  Denies NSAIDs, Anti-plt agents Denies family history of gastrointestinal disease and malignancy Previous Endoscopies: Esophagogastroduodenoscopy 09/10/2022-esophageal mass and gastric cardia mass consistent with poorly differentiated adenocarcinoma on pathology. Duodenitis also noted    Past Medical History:  Diagnosis Date   Acute ischemic stroke (HCC) 09/06/2017   Acute renal failure superimposed on stage 3a chronic kidney disease (HCC) 07/08/2019   Acute right-sided weakness    Allergies    Anasarca    Anemia 07/10/2017   Arthritis    CHF (congestive heart failure) (HCC)    "coinsided w/kidney problems I was having 06/2017"   Chicken pox    CKD (chronic kidney disease) stage 3, GFR 30-59 ml/min (HCC) 06/2017   Depression    Elevated troponin 07/08/2019   High  cholesterol    History of cardiomyopathy    LVEF 40 to 45% in April 2019 - subsequently normalized   Hyperbilirubinemia 07/10/2017   Hypertension    Ischemic stroke (HCC)    Small left internal capsule infarct due to lacunar disease   Morbid obesity (HCC)    Normocytic anemia 12/16/2017   Recurrent incisional hernia with incarceration s/p repair 10/22/2017 10/21/2017   Stroke (HCC) 08/2017   "right sided weakness since; getting stronger though" (10/22/2017)   Type 2 diabetes mellitus (HCC)     Past Surgical History:  Procedure Laterality Date   ABDOMINAL HERNIA REPAIR  2008; 10/22/2017   "scope; OPEN REPAIR INCARCERATED VENTRAL HERNIA   ESOPHAGOGASTRODUODENOSCOPY (EGD) WITH PROPOFOL N/A 09/10/2022   Procedure: ESOPHAGOGASTRODUODENOSCOPY (EGD) WITH PROPOFOL;  Surgeon: Vanga, Rohini Reddy, MD;  Location: [facility];  Service: Gastroenterology;  Laterality: N/A;   HERNIA REPAIR     KNEE ARTHROSCOPY Right 1989   PORTA CATH INSERTION N/A 09/25/2022   Procedure: PORTA CATH INSERTION;  Surgeon: Dew, Jason S, MD;  Location: [facility];  Service: Cardiovascular;  Laterality: N/A;   VENTRAL HERNIA REPAIR N/A 10/22/2017   Procedure: OPEN REPAIR INCARCERATED VENTRAL HERNIA;  Surgeon: Hoxworth, Benjamin, MD;  Location: [facility] OR;  Service: General;  Laterality: N/A;    Allergies  Allergen Reactions   Pollen Extract Other (See Comments)   Sulfa Antibiotics     Family History  Problem Relation Age of Onset   Diabetes Mother    Stroke Mother    Arthritis Mother      Depression Mother    Heart disease Mother    Hypertension Mother    Learning disabilities Mother    Mental illness Mother    Sleep apnea Mother    Diabetes Father    Heart disease Father    Arthritis Father    Hearing loss Father    Hyperlipidemia Father    Heart attack Father    Hypertension Father    Stroke Father    Diabetes Sister    Depression Sister    Diabetes Sister    Hypertension Sister    Mental  illness Sister    Sleep apnea Sister    Diabetes Maternal Grandmother    Heart disease Maternal Grandmother    Depression Maternal Grandmother    Hyperlipidemia Maternal Grandmother    Hypertension Maternal Grandmother    Stroke Maternal Grandmother    Hypertension Maternal Grandfather    Hyperlipidemia Maternal Grandfather    Stroke Maternal Grandfather    Arthritis Paternal Grandmother    Hearing loss Paternal Grandmother    Stroke Paternal Grandfather    Heart disease Paternal Grandfather    Arthritis Paternal Grandfather    Heart attack Paternal Grandfather    Melanoma Other     Social History   Tobacco Use   Smoking status: Never   Smokeless tobacco: Never  Vaping Use   Vaping Use: Never used  Substance Use Topics   Alcohol use: Not Currently    Comment: occasional beer   Drug use: Never     Pertinent GI related history and allergies were reviewed with the patient  Review of Systems  Constitutional:  Positive for activity change and fatigue. Negative for appetite change, chills, diaphoresis, fever and unexpected weight change.  HENT:  Negative for trouble swallowing and voice change.   Respiratory:  Negative for shortness of breath and wheezing.   Cardiovascular:  Negative for chest pain, palpitations and leg swelling.  Gastrointestinal:  Negative for abdominal distention, abdominal pain, anal bleeding, blood in stool, constipation, diarrhea, nausea and vomiting.  Musculoskeletal:  Negative for arthralgias and myalgias.  Skin:  Negative for color change and pallor.  Neurological:  Positive for dizziness, weakness and light-headedness. Negative for syncope.  Psychiatric/Behavioral:  Negative for confusion. The patient is not nervous/anxious.   All other systems reviewed and are negative.    Medications Home Medications No current facility-administered medications on file prior to encounter.   Current Outpatient Medications on File Prior to Encounter   Medication Sig Dispense Refill   amLODipine (NORVASC) 10 MG tablet Take 10 mg by mouth daily. (Patient not taking: Reported on 09/25/2022)     apixaban (ELIQUIS) 5 MG TABS tablet Take 1 tablet (5 mg total) by mouth 2 (two) times daily. 60 tablet 11   atorvastatin (LIPITOR) 40 MG tablet Take 1 tablet (40 mg total) by mouth daily at 6 PM. 90 tablet 3   busPIRone (BUSPAR) 7.5 MG tablet Take 1 tablet (7.5 mg total) by mouth 2 (two) times daily. 60 tablet 3   Continuous Blood Gluc Sensor (FREESTYLE LIBRE 2 SENSOR) MISC Place 1 sensor on the skin every 14 days. Use to check glucose continuously 2 each 11   dapagliflozin propanediol (FARXIGA) 10 MG TABS tablet Take 1 tablet (10 mg) by mouth once daily/please contact office to schedule follow up prior to further refills. 180 tablet 0   dexamethasone (DECADRON) 4 MG tablet Take 2 tablets (8 mg total) by mouth daily. Start the day after chemotherapy for 2 days. Take with   food. 30 tablet 1   hydrALAZINE (APRESOLINE) 50 MG tablet Take 1 tablet (50 mg total) by mouth in the morning and at bedtime. please contact office to schedule follow up prior to further refills. 180 tablet 0   irbesartan (AVAPRO) 300 MG tablet Take 300 mg by mouth daily.     lidocaine-prilocaine (EMLA) cream Apply to affected area once (Patient not taking: Reported on 09/27/2022) 30 g 3   LORazepam (ATIVAN) 0.5 MG tablet Take 1 tablet (0.5 mg total) by mouth every 12 (twelve) hours as needed for anxiety or sleep (nausea vomiting.). (Patient not taking: Reported on 09/27/2022) 60 tablet 0   ondansetron (ZOFRAN) 8 MG tablet Take 1 tablet (8 mg total) by mouth every 8 (eight) hours as needed for nausea or vomiting. Start on the third day after chemotherapy. (Patient not taking: Reported on 09/27/2022) 30 tablet 1   ondansetron (ZOFRAN-ODT) 8 MG disintegrating tablet Take 1 tablet (8 mg total) by mouth every 8 (eight) hours as needed for nausea or vomiting. Take 72 hours after chemotherapy if needed.  (Patient not taking: Reported on 09/27/2022) 90 tablet 1   oxyCODONE (ROXICODONE) 5 MG immediate release tablet Take 1 tablet (5 mg total) by mouth every 8 (eight) hours as needed for moderate pain. (Patient not taking: Reported on 09/27/2022) 10 tablet 0   prochlorperazine (COMPAZINE) 10 MG tablet Take 1 tablet (10 mg total) by mouth every 6 (six) hours as needed for nausea or vomiting. (Patient not taking: Reported on 09/27/2022) 30 tablet 1   torsemide (DEMADEX) 20 MG tablet Take 2 tablets (40 mg total) by mouth daily. (Patient not taking: Reported on 09/27/2022) 90 tablet 3   Pertinent GI related medications were reviewed with the patient  Inpatient Medications  Current Facility-Administered Medications:    0.9 %  sodium chloride infusion, 10 mL/hr, Intravenous, Once, Jessup, Charles, MD   0.9 %  sodium chloride infusion, , Intravenous, Continuous, Taesean Reth Michael, DO   acetaminophen (TYLENOL) tablet 650 mg, 650 mg, Oral, Q6H PRN, Niu, Xilin, MD   furosemide (LASIX) injection 40 mg, 40 mg, Intravenous, Once, Niu, Xilin, MD   hydrALAZINE (APRESOLINE) injection 5 mg, 5 mg, Intravenous, Q2H PRN, Niu, Xilin, MD   insulin aspart (novoLOG) injection 0-5 Units, 0-5 Units, Subcutaneous, QHS, Niu, Xilin, MD   insulin aspart (novoLOG) injection 0-9 Units, 0-9 Units, Subcutaneous, TID WC, Niu, Xilin, MD   iron sucrose (VENOFER) 200 mg in sodium chloride 0.9 % 100 mL IVPB, 200 mg, Intravenous, Q24H, Niu, Xilin, MD   LORazepam (ATIVAN) injection 0.5 mg, 0.5 mg, Intravenous, Once, Niu, Xilin, MD   LORazepam (ATIVAN) tablet 0.5 mg, 0.5 mg, Oral, Q6H PRN, Niu, Xilin, MD   ondansetron (ZOFRAN) injection 4 mg, 4 mg, Intravenous, Q8H PRN, Niu, Xilin, MD   pantoprazole (PROTONIX) injection 40 mg, 40 mg, Intravenous, Q12H, Niu, Xilin, MD  sodium chloride     sodium chloride     iron sucrose      acetaminophen, hydrALAZINE, LORazepam, ondansetron (ZOFRAN) IV   Objective   Vitals:   09/27/22 1345  09/27/22 1400 09/27/22 1423 09/27/22 1500  BP: 127/83 122/82 129/78 129/78  Pulse: (!) 103 (!) 105 88 88  Resp: 11 19 18 18  Temp:   98.8 F (37.1 C) 98.8 F (37.1 C)  TempSrc:   Oral Oral  SpO2: 95% 96% 97%   Weight:    107.7 kg  Height:    5' 10" (1.778 m)    Physical Exam Vitals   and nursing note reviewed.  Constitutional:      General: He is not in acute distress.    Appearance: He is obese. He is ill-appearing. He is not toxic-appearing or diaphoretic.  HENT:     Head: Normocephalic and atraumatic.     Nose: Nose normal.     Mouth/Throat:     Mouth: Mucous membranes are moist.     Pharynx: Oropharynx is clear.  Eyes:     General: No scleral icterus.    Extraocular Movements: Extraocular movements intact.  Cardiovascular:     Rate and Rhythm: Tachycardia present. Rhythm irregular.     Heart sounds: Normal heart sounds. No murmur heard.    No friction rub. No gallop.  Pulmonary:     Effort: Pulmonary effort is normal. No respiratory distress.     Breath sounds: Normal breath sounds. No wheezing, rhonchi or rales.  Abdominal:     General: Bowel sounds are normal. There is no distension.     Palpations: Abdomen is soft.     Tenderness: There is no abdominal tenderness. There is no guarding or rebound.  Musculoskeletal:     Cervical back: Neck supple.     Right lower leg: No edema.     Left lower leg: No edema.  Skin:    General: Skin is warm and dry.     Coloration: Skin is pale. Skin is not jaundiced.  Neurological:     Mental Status: He is alert and oriented to person, place, and time.  Psychiatric:     Comments: Flat affect     Laboratory Data Recent Labs  Lab 09/27/22 0849 09/27/22 1110  WBC 6.9 6.9  HGB 7.3* 7.2*  HCT 23.9* 24.5*  PLT 318 299   Recent Labs  Lab 09/27/22 0849 09/27/22 1110  NA 133* 137  K 4.4 5.0  CL 100 103  CO2 23 23  BUN 41* 42*  CALCIUM 8.5* 8.9  PROT 7.1  --   BILITOT 0.6  --   ALKPHOS 64  --   ALT 11  --   AST 37   --   GLUCOSE 228* 194*   No results for input(s): "INR" in the last 168 hours.  No results for input(s): "LIPASE" in the last 72 hours.      Imaging Studies: No results found.  Assessment:   # Acute on chronic symptomatic anemia -Recent port placement -Patient may have oozing from mass but has not noted any symptoms of GI bleeding which could be contributory to his anemia -7.3 today.  He is receiving 1 unit PRBC in the emergency department  # Metastatic esophageal/gastric poorly differentiated adenocarcinoma -Was scheduled to start chemotherapy today -Diagnosed on 6/11 EGD  # HFrEF  # Afib on eliquis - last dose Tuesday evening  # CKD  Plan:  Esophagogastroduodenoscopy planned for tomorrow pending patient stability and endoscopy suite availability If there is active bleeding from the mass, could potentially hemaspray the area depending on where it is coming from Previously noted that pediatric endoscopic was needed to traverse mass, which hemaspray catheter does not fit in. If the bleed is at the superior aspect of the mass, could possible perform through adult gastroscope. If bleed present in distal part of mass, may benefit from IR evaluation and later radiation therapy  NPO at midnight Clear liquids now Labs in am- bmp, cbc, inr Protonix 40 mg iv q12 h Hold dvt ppx and eliquis  Monitor H&H.  Transfusion and resuscitation as per   primary team Avoid frequent lab draws to prevent lab induced anemia Supportive care and antiemetics as per primary team Maintain two sites IV access Avoid nsaids Monitor for GIB.  Esophagogastroduodenoscopy with possible biopsy, control of bleeding, polypectomy, and interventions as necessary has been discussed with the patient/patient representative. Informed consent was obtained from the patient/patient representative after explaining the indication, nature, and risks of the procedure including but not limited to death, bleeding,  perforation, missed neoplasm/lesions, cardiorespiratory compromise, and reaction to medications. Opportunity for questions was given and appropriate answers were provided. Patient/patient representative has verbalized understanding is amenable to undergoing the procedure.   I personally performed the service.  Management of other medical comorbidities as per primary team  Thank you for allowing us to participate in this patient's care. Please don't hesitate to call if any questions or concerns arise.   Truman Aceituno Michael Khiana Camino, DO [facility]  Portions of the record may have been created with voice recognition software. Occasional wrong-word or 'sound-a-like' substitutions may have occurred due to the inherent limitations of voice recognition software.  Read the chart carefully and recognize, using context, where substitutions may have occurred.  

## 2022-09-27 NOTE — ED Notes (Signed)
Pt has PORT accessed with blood return and flushed appropriately.

## 2022-09-27 NOTE — Assessment & Plan Note (Addendum)
GE junction/gastric cardia adenocarcinoma. PET scan is done and the official report is pending.  Case was discussed at tumor board.  Preliminary PET scan showed multiple distant metastasis.  Stage IV disease. HER2 status and NGS molecular studies are pending.  May add anti-HER2 therapy/immunotherapy if he is eligible Labs are reviewed and discussed with patient. Hold chemotherapy due to acute drop of hemoglobin.

## 2022-09-27 NOTE — H&P (View-Only) (Signed)
Inpatient Consultation   Patient ID: Jeremiah Vance is a 52 y.o. male.  Requesting Provider: Dr. 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  GI is consulted for anemia.  His sister is at bedside who helps provide history.  Hemoglobin 7.3 on presentation from 9.3  8 days ago.  BUN 42 creatinine 1.9.  The patient denies hematochezia melena hematemesis or coffee-ground emesis.  He denies abdominal pain.  Sister reports he has had brown bowel movements.  She also notes the patient has been eating normally.  He is scheduled to undergo palliative chemotherapy but presented to the hospital due to the symptoms.  He is receiving 1 unit PRBC in the emergency department  Patient recently had port placed on 09/25/22.  His last dose of Eliquis was Tuesday evening  Denies NSAIDs, Anti-plt agents Denies family history of gastrointestinal disease and malignancy Previous Endoscopies: Esophagogastroduodenoscopy 09/10/2022-esophageal mass and gastric cardia mass consistent with poorly differentiated adenocarcinoma on pathology. Duodenitis also noted    Past Medical History:  Diagnosis Date   Acute ischemic stroke (HCC) 09/06/2017   Acute renal failure superimposed on stage 3a chronic kidney disease (HCC) 07/08/2019   Acute right-sided weakness    Allergies    Anasarca    Anemia 07/10/2017   Arthritis    CHF (congestive heart failure) (HCC)    "coinsided w/kidney problems I was having 06/2017"   Chicken pox    CKD (chronic kidney disease) stage 3, GFR 30-59 ml/min (HCC) 06/2017   Depression    Elevated troponin 07/08/2019   High  cholesterol    History of cardiomyopathy    LVEF 40 to 45% in April 2019 - subsequently normalized   Hyperbilirubinemia 07/10/2017   Hypertension    Ischemic stroke (HCC)    Small left internal capsule infarct due to lacunar disease   Morbid obesity (HCC)    Normocytic anemia 12/16/2017   Recurrent incisional hernia with incarceration s/p repair 10/22/2017 10/21/2017   Stroke (HCC) 08/2017   "right sided weakness since; getting stronger though" (10/22/2017)   Type 2 diabetes mellitus (HCC)     Past Surgical History:  Procedure Laterality Date   ABDOMINAL HERNIA REPAIR  2008; 10/22/2017   "scope; OPEN REPAIR INCARCERATED VENTRAL HERNIA   ESOPHAGOGASTRODUODENOSCOPY (EGD) WITH PROPOFOL N/A 09/10/2022   Procedure: ESOPHAGOGASTRODUODENOSCOPY (EGD) WITH PROPOFOL;  Surgeon: Toney Reil, MD;  Location: [facility] ENDOSCOPY;  Service: Gastroenterology;  Laterality: N/A;   HERNIA REPAIR     KNEE ARTHROSCOPY Right 1989   PORTA CATH INSERTION N/A 09/25/2022   Procedure: PORTA CATH INSERTION;  Surgeon: Annice Needy, MD;  Location: [facility] INVASIVE CV LAB;  Service: Cardiovascular;  Laterality: N/A;   VENTRAL HERNIA REPAIR N/A 10/22/2017   Procedure: OPEN REPAIR INCARCERATED VENTRAL HERNIA;  Surgeon: Glenna Fellows, MD;  Location: [facility] OR;  Service: General;  Laterality: N/A;    Allergies  Allergen Reactions   Pollen Extract Other (See Comments)   Sulfa Antibiotics     Family History  Problem Relation Age of Onset   Diabetes Mother    Stroke Mother    Arthritis Mother  Depression Mother    Heart disease Mother    Hypertension Mother    Learning disabilities Mother    Mental illness Mother    Sleep apnea Mother    Diabetes Father    Heart disease Father    Arthritis Father    Hearing loss Father    Hyperlipidemia Father    Heart attack Father    Hypertension Father    Stroke Father    Diabetes Sister    Depression Sister    Diabetes Sister    Hypertension Sister    Mental  illness Sister    Sleep apnea Sister    Diabetes Maternal Grandmother    Heart disease Maternal Grandmother    Depression Maternal Grandmother    Hyperlipidemia Maternal Grandmother    Hypertension Maternal Grandmother    Stroke Maternal Grandmother    Hypertension Maternal Grandfather    Hyperlipidemia Maternal Grandfather    Stroke Maternal Grandfather    Arthritis Paternal Grandmother    Hearing loss Paternal Grandmother    Stroke Paternal Grandfather    Heart disease Paternal Grandfather    Arthritis Paternal Grandfather    Heart attack Paternal Grandfather    Melanoma Other     Social History   Tobacco Use   Smoking status: Never   Smokeless tobacco: Never  Vaping Use   Vaping Use: Never used  Substance Use Topics   Alcohol use: Not Currently    Comment: occasional beer   Drug use: Never     Pertinent GI related history and allergies were reviewed with the patient  Review of Systems  Constitutional:  Positive for activity change and fatigue. Negative for appetite change, chills, diaphoresis, fever and unexpected weight change.  HENT:  Negative for trouble swallowing and voice change.   Respiratory:  Negative for shortness of breath and wheezing.   Cardiovascular:  Negative for chest pain, palpitations and leg swelling.  Gastrointestinal:  Negative for abdominal distention, abdominal pain, anal bleeding, blood in stool, constipation, diarrhea, nausea and vomiting.  Musculoskeletal:  Negative for arthralgias and myalgias.  Skin:  Negative for color change and pallor.  Neurological:  Positive for dizziness, weakness and light-headedness. Negative for syncope.  Psychiatric/Behavioral:  Negative for confusion. The patient is not nervous/anxious.   All other systems reviewed and are negative.    Medications Home Medications No current facility-administered medications on file prior to encounter.   Current Outpatient Medications on File Prior to Encounter   Medication Sig Dispense Refill   amLODipine (NORVASC) 10 MG tablet Take 10 mg by mouth daily. (Patient not taking: Reported on 09/25/2022)     apixaban (ELIQUIS) 5 MG TABS tablet Take 1 tablet (5 mg total) by mouth 2 (two) times daily. 60 tablet 11   atorvastatin (LIPITOR) 40 MG tablet Take 1 tablet (40 mg total) by mouth daily at 6 PM. 90 tablet 3   busPIRone (BUSPAR) 7.5 MG tablet Take 1 tablet (7.5 mg total) by mouth 2 (two) times daily. 60 tablet 3   Continuous Blood Gluc Sensor (FREESTYLE LIBRE 2 SENSOR) MISC Place 1 sensor on the skin every 14 days. Use to check glucose continuously 2 each 11   dapagliflozin propanediol (FARXIGA) 10 MG TABS tablet Take 1 tablet (10 mg) by mouth once daily/please contact office to schedule follow up prior to further refills. 180 tablet 0   dexamethasone (DECADRON) 4 MG tablet Take 2 tablets (8 mg total) by mouth daily. Start the day after chemotherapy for 2 days. Take with  food. 30 tablet 1   hydrALAZINE (APRESOLINE) 50 MG tablet Take 1 tablet (50 mg total) by mouth in the morning and at bedtime. please contact office to schedule follow up prior to further refills. 180 tablet 0   irbesartan (AVAPRO) 300 MG tablet Take 300 mg by mouth daily.     lidocaine-prilocaine (EMLA) cream Apply to affected area once (Patient not taking: Reported on 09/27/2022) 30 g 3   LORazepam (ATIVAN) 0.5 MG tablet Take 1 tablet (0.5 mg total) by mouth every 12 (twelve) hours as needed for anxiety or sleep (nausea vomiting.). (Patient not taking: Reported on 09/27/2022) 60 tablet 0   ondansetron (ZOFRAN) 8 MG tablet Take 1 tablet (8 mg total) by mouth every 8 (eight) hours as needed for nausea or vomiting. Start on the third day after chemotherapy. (Patient not taking: Reported on 09/27/2022) 30 tablet 1   ondansetron (ZOFRAN-ODT) 8 MG disintegrating tablet Take 1 tablet (8 mg total) by mouth every 8 (eight) hours as needed for nausea or vomiting. Take 72 hours after chemotherapy if needed.  (Patient not taking: Reported on 09/27/2022) 90 tablet 1   oxyCODONE (ROXICODONE) 5 MG immediate release tablet Take 1 tablet (5 mg total) by mouth every 8 (eight) hours as needed for moderate pain. (Patient not taking: Reported on 09/27/2022) 10 tablet 0   prochlorperazine (COMPAZINE) 10 MG tablet Take 1 tablet (10 mg total) by mouth every 6 (six) hours as needed for nausea or vomiting. (Patient not taking: Reported on 09/27/2022) 30 tablet 1   torsemide (DEMADEX) 20 MG tablet Take 2 tablets (40 mg total) by mouth daily. (Patient not taking: Reported on 09/27/2022) 90 tablet 3   Pertinent GI related medications were reviewed with the patient  Inpatient Medications  Current Facility-Administered Medications:    0.9 %  sodium chloride infusion, 10 mL/hr, Intravenous, Once, Chesley Noon, MD   0.9 %  sodium chloride infusion, , Intravenous, Continuous, Jaynie Collins, DO   acetaminophen (TYLENOL) tablet 650 mg, 650 mg, Oral, Q6H PRN, Lorretta Harp, MD   furosemide (LASIX) injection 40 mg, 40 mg, Intravenous, Once, Lorretta Harp, MD   hydrALAZINE (APRESOLINE) injection 5 mg, 5 mg, Intravenous, Q2H PRN, Lorretta Harp, MD   insulin aspart (novoLOG) injection 0-5 Units, 0-5 Units, Subcutaneous, QHS, Lorretta Harp, MD   insulin aspart (novoLOG) injection 0-9 Units, 0-9 Units, Subcutaneous, TID WC, Lorretta Harp, MD   iron sucrose (VENOFER) 200 mg in sodium chloride 0.9 % 100 mL IVPB, 200 mg, Intravenous, Q24H, Lorretta Harp, MD   LORazepam (ATIVAN) injection 0.5 mg, 0.5 mg, Intravenous, Once, Lorretta Harp, MD   LORazepam (ATIVAN) tablet 0.5 mg, 0.5 mg, Oral, Q6H PRN, Lorretta Harp, MD   ondansetron [facility]) injection 4 mg, 4 mg, Intravenous, Q8H PRN, Lorretta Harp, MD   pantoprazole (PROTONIX) injection 40 mg, 40 mg, Intravenous, Q12H, Lorretta Harp, MD  sodium chloride     sodium chloride     iron sucrose      acetaminophen, hydrALAZINE, LORazepam, ondansetron (ZOFRAN) IV   Objective   Vitals:   09/27/22 1345  09/27/22 1400 09/27/22 1423 09/27/22 1500  BP: 127/83 122/82 129/78 129/78  Pulse: (!) 103 (!) 105 88 88  Resp: 11 19 18 18   Temp:   98.8 F (37.1 C) 98.8 F (37.1 C)  TempSrc:   Oral Oral  SpO2: 95% 96% 97%   Weight:    107.7 kg  Height:    5\' 10"  (1.778 m)    Physical Exam Vitals  and nursing note reviewed.  Constitutional:      General: He is not in acute distress.    Appearance: He is obese. He is ill-appearing. He is not toxic-appearing or diaphoretic.  HENT:     Head: Normocephalic and atraumatic.     Nose: Nose normal.     Mouth/Throat:     Mouth: Mucous membranes are moist.     Pharynx: Oropharynx is clear.  Eyes:     General: No scleral icterus.    Extraocular Movements: Extraocular movements intact.  Cardiovascular:     Rate and Rhythm: Tachycardia present. Rhythm irregular.     Heart sounds: Normal heart sounds. No murmur heard.    No friction rub. No gallop.  Pulmonary:     Effort: Pulmonary effort is normal. No respiratory distress.     Breath sounds: Normal breath sounds. No wheezing, rhonchi or rales.  Abdominal:     General: Bowel sounds are normal. There is no distension.     Palpations: Abdomen is soft.     Tenderness: There is no abdominal tenderness. There is no guarding or rebound.  Musculoskeletal:     Cervical back: Neck supple.     Right lower leg: No edema.     Left lower leg: No edema.  Skin:    General: Skin is warm and dry.     Coloration: Skin is pale. Skin is not jaundiced.  Neurological:     Mental Status: He is alert and oriented to person, place, and time.  Psychiatric:     Comments: Flat affect     Laboratory Data Recent Labs  Lab 09/27/22 0849 09/27/22 1110  WBC 6.9 6.9  HGB 7.3* 7.2*  HCT 23.9* 24.5*  PLT 318 299   Recent Labs  Lab 09/27/22 0849 09/27/22 1110  NA 133* 137  K 4.4 5.0  CL 100 103  CO2 23 23  BUN 41* 42*  CALCIUM 8.5* 8.9  PROT 7.1  --   BILITOT 0.6  --   ALKPHOS 64  --   ALT 11  --   AST 37   --   GLUCOSE 228* 194*   No results for input(s): "INR" in the last 168 hours.  No results for input(s): "LIPASE" in the last 72 hours.      Imaging Studies: No results found.  Assessment:   # Acute on chronic symptomatic anemia -Recent port placement -Patient may have oozing from mass but has not noted any symptoms of GI bleeding which could be contributory to his anemia -7.3 today.  He is receiving 1 unit PRBC in the emergency department  # Metastatic esophageal/gastric poorly differentiated adenocarcinoma -Was scheduled to start chemotherapy today -Diagnosed on 6/11 EGD  # HFrEF  # Afib on eliquis - last dose Tuesday evening  # CKD  Plan:  Esophagogastroduodenoscopy planned for tomorrow pending patient stability and endoscopy suite availability If there is active bleeding from the mass, could potentially hemaspray the area depending on where it is coming from Previously noted that pediatric endoscopic was needed to traverse mass, which hemaspray catheter does not fit in. If the bleed is at the superior aspect of the mass, could possible perform through adult gastroscope. If bleed present in distal part of mass, may benefit from IR evaluation and later radiation therapy  NPO at midnight Clear liquids now Labs in am- bmp, cbc, inr Protonix 40 mg iv q12 h Hold dvt ppx and eliquis  Monitor H&H.  Transfusion and resuscitation as per  primary team Avoid frequent lab draws to prevent lab induced anemia Supportive care and antiemetics as per primary team Maintain two sites IV access Avoid nsaids Monitor for GIB.  Esophagogastroduodenoscopy with possible biopsy, control of bleeding, polypectomy, and interventions as necessary has been discussed with the patient/patient representative. Informed consent was obtained from the patient/patient representative after explaining the indication, nature, and risks of the procedure including but not limited to death, bleeding,  perforation, missed neoplasm/lesions, cardiorespiratory compromise, and reaction to medications. Opportunity for questions was given and appropriate answers were provided. Patient/patient representative has verbalized understanding is amenable to undergoing the procedure.   I personally performed the service.  Management of other medical comorbidities as per primary team  Thank you for allowing [place] to participate in this patient's care. Please don't hesitate to call if any questions or concerns arise.   Jaynie Collins, DO [facility]  Portions of the record may have been created with voice recognition software. Occasional wrong-word or 'sound-a-like' substitutions may have occurred due to the inherent limitations of voice recognition software.  Read the chart carefully and recognize, using context, where substitutions may have occurred.

## 2022-09-27 NOTE — Progress Notes (Signed)
No concerns for the provider today. 

## 2022-09-27 NOTE — Assessment & Plan Note (Signed)
LFT is stable.

## 2022-09-27 NOTE — Assessment & Plan Note (Signed)
Hemoglobin dropped 2 g since 1 week ago Recommend PRBC transfusion.  Suspect GI bleeding.  Recommend patient to go to ER for evaluation.  I discussed with ER triage RN and patient is being admitted. Discussed with patient admission hospitalist, recommend GI evaluation, PRBC transfusion, Venofer treatments.

## 2022-09-27 NOTE — ED Provider Notes (Signed)
[facility] Provider Note    Event Date/Time   First MD Initiated Contact with Patient 09/27/22 1119     (approximate)   History   Chief Complaint low hgb   HPI  Jeremiah Vance is a 52 y.o. male with past medical history of hypertension, hyperlipidemia, diabetes, CAD, CHF, atrial fibrillation on Eliquis, stroke, CKD, and esophageal cancer who presents to the ED complaining of low hemoglobin.  Patient reports that he has been feeling increasingly weak and fatigued over the past 2 days.  He went for follow-up with his oncologist earlier today, was found to have low hemoglobin at 7.3 and was referred to the ED for further evaluation.  He denies any pain in his chest or difficulty breathing, has not noticed any blood in his stool or dark tarry stool.  He denies any history of GI bleeding, has not required transfusion in the past.  He was recently diagnosed with metastatic esophageal cancer, was supposed to start treatment earlier today before abnormal labs were discovered.     Physical Exam   Triage Vital Signs: ED Triage Vitals  Enc Vitals Group     BP 09/27/22 1104 110/70     Pulse Rate 09/27/22 1104 91     Resp 09/27/22 1104 18     Temp 09/27/22 1104 98 F (36.7 C)     Temp src --      SpO2 09/27/22 1104 98 %     Weight --      Height --      Head Circumference --      Peak Flow --      Pain Score 09/27/22 1059 0     Pain Loc --      Pain Edu? --      Excl. in GC? --     Most recent vital signs: Vitals:   09/27/22 1245 09/27/22 1300  BP: 124/86 122/74  Pulse: (!) 101 (!) 111  Resp: 19 (!) 23  Temp:    SpO2: 98% 98%    Constitutional: Alert and oriented.  Pale appearing. Eyes: Conjunctivae are normal. Head: Atraumatic. Nose: No congestion/rhinnorhea. Mouth/Throat: Mucous membranes are moist.  Cardiovascular: Normal rate, irregularly irregular rhythm. Grossly normal heart sounds.  2+ radial pulses bilaterally. Respiratory: Normal  respiratory effort.  No retractions. Lungs CTAB. Gastrointestinal: Soft and non-tender, chronic incisional hernia is easily reducible. No distention.  Rectal exam with dark guaiac positive stool. Musculoskeletal: No lower extremity tenderness nor edema.  Neurologic:  Normal speech and language. No gross focal neurologic deficits are appreciated.    ED Results / Procedures / Treatments   Labs (all labs ordered are listed, but only abnormal results are displayed) Labs Reviewed  BASIC METABOLIC PANEL - Abnormal; Notable for the following components:      Result Value   Glucose, Bld 194 (*)    BUN 42 (*)    Creatinine, Ser 1.90 (*)    GFR, Estimated 42 (*)    All other components within normal limits  CBC - Abnormal; Notable for the following components:   RBC 2.91 (*)    Hemoglobin 7.2 (*)    HCT 24.5 (*)    MCH 24.7 (*)    MCHC 29.4 (*)    RDW 18.2 (*)    All other components within normal limits  TROPONIN I (HIGH SENSITIVITY) - Abnormal; Notable for the following components:   Troponin I (High Sensitivity) 41 (*)    All other components within  normal limits  MAGNESIUM  URINALYSIS, ROUTINE W REFLEX MICROSCOPIC  CBC  CBC  BRAIN NATRIURETIC PEPTIDE  PROTIME-INR  APTT  TYPE AND SCREEN  PREPARE RBC (CROSSMATCH)     EKG  ED ECG REPORT I, Chesley Noon, the attending physician, personally viewed and interpreted this ECG.   Date: 09/27/2022  EKG Time: 11:13  Rate: 127  Rhythm: atrial fibrillation  Axis: Normal  Intervals:none  ST&T Change: None  PROCEDURES:  Critical Care performed: Yes, see critical care procedure note(s)  .Critical Care  Performed by: Chesley Noon, MD Authorized by: Chesley Noon, MD   Critical care provider statement:    Critical care time (minutes):  30   Critical care time was exclusive of:  Separately billable procedures and treating other patients and teaching time   Critical care was necessary to treat or prevent imminent or  life-threatening deterioration of the following conditions: Anemia.   Critical care was time spent personally by me on the following activities:  Development of treatment plan with patient or surrogate, discussions with consultants, evaluation of patient's response to treatment, examination of patient, ordering and review of laboratory studies, ordering and review of radiographic studies, ordering and performing treatments and interventions, pulse oximetry, re-evaluation of patient's condition and review of old charts   I assumed direction of critical care for this patient from another provider in my specialty: no     Care discussed with: admitting provider      MEDICATIONS ORDERED IN ED: Medications  0.9 %  sodium chloride infusion (has no administration in time range)  0.9 %  sodium chloride infusion (has no administration in time range)  pantoprazole (PROTONIX) injection 40 mg (has no administration in time range)  ondansetron (ZOFRAN) injection 4 mg (has no administration in time range)  hydrALAZINE (APRESOLINE) injection 5 mg (has no administration in time range)  acetaminophen (TYLENOL) tablet 650 mg (has no administration in time range)  LORazepam (ATIVAN) tablet 0.5 mg (has no administration in time range)  pantoprazole (PROTONIX) injection 40 mg (40 mg Intravenous Given 09/27/22 1205)     IMPRESSION / MDM / ASSESSMENT AND PLAN / ED COURSE  I reviewed the triage vital signs and the nursing notes.                              51 y.o. male with past medical history of hypertension, hyperlipidemia, diabetes, CAD, CHF, atrial fibrillation on Eliquis, stroke, CKD, anemia, and metastatic esophageal cancer who presents to the ED complaining of weakness and fatigue with low hemoglobin noted at his oncologist office earlier today.  Patient's presentation is most consistent with acute presentation with potential threat to life or bodily function.  Differential diagnosis includes, but is not  limited to, symptomatic anemia, arrhythmia, ACS, electrolyte abnormality, AKI, upper GI bleed, lower GI bleed.  Patient chronically ill-appearing but in no acute distress, vital signs are unremarkable.  He has a chronic large and central hernia but this is easily reducible and he has no tenderness on exam.  Rectal exam does have dark guaiac positive stool and I am concerned for upper GI bleed from his recently diagnosed esophageal mass.  We will transfuse 1 unit PRBCs given hemoglobin of 7.3 with significant symptoms, which appears to be an acute drop from 9.3 eight days ago.  He is in atrial fibrillation but rate is currently controlled.  Case discussed with Dr. Timothy Lasso from GI, who will assess the patient.  Patient was evaluated by Dr. Timothy Lasso, who will tentatively plan for EGD tomorrow. Remainder of labs show mild AKI without acute electrolyte abnormality, troponin mildly elevated from baseline, but low suspicion for ACS at this time and will continue to trend.  Case discussed with hospitalist for admission.     FINAL CLINICAL IMPRESSION(S) / ED DIAGNOSES   Final diagnoses:  Upper GI bleed  Esophageal mass     Rx / DC Orders   ED Discharge Orders     None        Note:  This document was prepared using Dragon voice recognition software and may include unintentional dictation errors.   Chesley Noon, MD 09/27/22 [phone]

## 2022-09-27 NOTE — Assessment & Plan Note (Addendum)
Recommend IV Venofer treatments.  Hold off Eliquis

## 2022-09-27 NOTE — Progress Notes (Signed)
Hematology/Oncology Progress note [phone] [phone]        REFERRING PROVIDER: Rickard Patience, MD    CHIEF COMPLAINTS/PURPOSE OF CONSULTATION:  GE junction cancer/gastric cardia cancer.  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 PET scan is done and the official report is pending.  Case was discussed at tumor board.  Preliminary PET scan showed multiple distant metastasis.  Stage IV disease. HER2 status and NGS molecular studies are pending.  May add anti-HER2 therapy/immunotherapy if he is eligible Labs are reviewed and discussed with patient. Hold chemotherapy due to acute drop of hemoglobin.     IDA (iron deficiency anemia) Recommend IV Venofer treatments.  Hold off Eliquis    Weight loss Refer to nutritionist  Nausea without vomiting Continue antiemetics PRN as instructed  Anxiety associated with cancer diagnosis (HCC) Recommend Ativan 0.5 mg every 12 hours as needed for anxiety/sleep/nausea  Symptomatic anemia Hemoglobin dropped 2 g since 1 week ago Recommend PRBC transfusion.  Suspect GI bleeding.  Recommend patient to go to ER for evaluation.  I discussed with ER triage RN and patient is being admitted. Discussed with patient admission hospitalist, recommend GI evaluation, PRBC transfusion, Venofer treatments.   Paroxysmal atrial fibrillation (HCC) Hold Eliquis due to GI bleeding/anemia  Orders Placed This Encounter  Procedures   CEA    Standing Status:   Future    Standing Expiration Date:   10/02/2023   CBC with Differential (Cancer Center Only)    Standing Status:   Future    Standing Expiration Date:   10/02/2023   CMP (Cancer Center only)    Standing Status:   Future    Standing Expiration Date:   10/02/2023   CBC with Differential (Cancer Center Only)    Standing Status:   Future    Standing Expiration Date:   09/27/2023   Sample to Blood Bank    Standing Status:   Future     Standing Expiration Date:   09/27/2023   Follow-up in 1 week to start chemotherapy. All questions were answered. The patient knows to call the clinic with any problems, questions or concerns.  Rickard Patience, MD, PhD [facility] Hematology Oncology 09/27/2022    HISTORY OF PRESENTING ILLNESS:  Jeremiah Vance 52 y.o. male presents to establish care for  I have reviewed his chart and materials related to his cancer extensively and collaborated history with the patient. Summary of oncologic history is as follows: Oncology History  Gastric cancer (HCC)  09/08/2022 Imaging   CT abdomen pelvis w contrast     09/08/2022 Imaging   CT abdomen pelvis with contrast  Diffuse masslike thickening in the area of the GE junction and upper stomach with the extensive abnormal lymph nodes throughout the abdomen including retrocrural, porta hepatis, retroperitoneum and lesser and greater curve of the stomach. In addition there are areas of suggest peritoneal carcinomatosis. Recommend further evaluation.    No bowel obstruction. Large midline anterior pelvic wall hernia involving small bowel and mesenteric fat with rectus muscle diastasis. Gallstones.   09/19/2022 Cancer Staging   Staging form: Stomach, AJCC 8th Edition - Clinical stage from 09/19/2022: Stage IVB (cTX, cN2, cM1) - Signed by Rickard Patience, MD on 09/19/2022 Stage prefix: Initial diagnosis   09/19/2022 Initial Diagnosis   Gastric cancer (HCC)  09/08/2022, patient presented emergency room for evaluation of abdominal pain and vomiting.  He has also had weight loss.  Dysphagia with solid food for couple of months.  CT abdomen pelvis showed diffuse masslike thickening of the GE junction and upper stomach with extensive abnormal lymph nodes throughout abdomen.  09/10/2022 upper endoscopy showed malignant appearance gastric tumor at the GE junction and in the cardia Biopsied.  Nodular mucosa in the second portion of duodenum.  Biopsied. GE junction mass showed  moderate to poorly differentiated adenocarcinoma undermining squamous mucosa with focal area of squamous atypia.  Cannot exclude adenosquamous carcinoma.  Intestinal metaplasia is not identified. Duodenum cold biopsy-reactive duodenitis.  Negative for dysplasia and malignancy. Stomach random biopsy negative for H. pylori, intestinal metaplasia, dysplasia and malignancy.  Stomach Cardia mass biopsy showed moderate to poorly differentiated adenocarcinoma.  Patient's case was discussed on tumor board on 09/19/2022.  Per Dr. Oneita Kras, nephrology findings of both GE junction mass and gastric cardia mass or similar. HER2 staining is pending. Molecular testing is pending.     09/23/2022 Imaging   PET scan showed 1. 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 2. Enlarged cirrhotic liver. 3. Cholelithiasis. 4. Bilateral renal stones. 5. Umbilical hernias contain unobstructed small bowel and/or fat.  6. Aortic atherosclerosis (ICD10-I70.0). Coronary artery calcification. 7. Enlarged pulmonic trunk, indicative of pulmonary arterial hypertension.    10/02/2022 -  Chemotherapy   Patient is on Treatment Plan : GASTROESOPHAGEAL FOLFOX q14d      Patient is on anticoagulation with Eliquis for atrial fibrillation.  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 He lives at home with his sister.  Patient is undergoing neuro physical therapy. + nausea, Today he feels more tired. Appetite is poor. Denies hematochezia, hematuria, hematemesis, epistaxis, black tarry stool or easy bruising.  Medi port was placed and Eliquis has been held due to procedure.     MEDICAL HISTORY:  Past Medical History:  Diagnosis Date   Acute ischemic stroke (HCC) 09/06/2017   Acute renal failure superimposed on stage 3a chronic kidney disease (HCC) 07/08/2019   Acute right-sided weakness    Allergies    Anasarca    Anemia  07/10/2017   Arthritis    CHF (congestive heart failure) (HCC)    "coinsided w/kidney problems I was having 06/2017"   Chicken pox    CKD (chronic kidney disease) stage 3, GFR 30-59 ml/min (HCC) 06/2017   Depression    Elevated troponin 07/08/2019   High cholesterol    History of cardiomyopathy    LVEF 40 to 45% in April 2019 - subsequently normalized   Hyperbilirubinemia 07/10/2017   Hypertension    Ischemic stroke (HCC)    Small left internal capsule infarct due to lacunar disease   Morbid obesity (HCC)    Normocytic anemia 12/16/2017   Recurrent incisional hernia with incarceration s/p repair 10/22/2017 10/21/2017   Stroke (HCC) 08/2017   "right sided weakness since; getting stronger though" (10/22/2017)   Type 2 diabetes mellitus (HCC)     SURGICAL HISTORY: Past Surgical History:  Procedure Laterality Date   ABDOMINAL HERNIA REPAIR  2008; 10/22/2017   "scope; OPEN REPAIR INCARCERATED VENTRAL HERNIA   ESOPHAGOGASTRODUODENOSCOPY (EGD) WITH PROPOFOL N/A 09/10/2022   Procedure: ESOPHAGOGASTRODUODENOSCOPY (EGD) WITH PROPOFOL;  Surgeon: Toney Reil, MD;  Location: [facility] ENDOSCOPY;  Service: Gastroenterology;  Laterality: N/A;   HERNIA REPAIR     KNEE ARTHROSCOPY Right 1989   PORTA CATH INSERTION N/A 09/25/2022   Procedure: PORTA CATH INSERTION;  Surgeon: Annice Needy, MD;  Location: [facility] INVASIVE CV LAB;  Service: Cardiovascular;  Laterality: N/A;  VENTRAL HERNIA REPAIR N/A 10/22/2017   Procedure: OPEN REPAIR INCARCERATED VENTRAL HERNIA;  Surgeon: Glenna Fellows, MD;  Location: [facility] OR;  Service: General;  Laterality: N/A;    SOCIAL HISTORY: Social History   Socioeconomic History   Marital status: Divorced    Spouse name: Not on file   Number of children: Not on file   Years of education: Not on file   Highest education level: Not on file  Occupational History   Not on file  Tobacco Use   Smoking status: Never   Smokeless tobacco: Never  Vaping Use   Vaping Use: Never  used  Substance and Sexual Activity   Alcohol use: Not Currently    Comment: occasional beer   Drug use: Never   Sexual activity: Not Currently  Other Topics Concern   Not on file  Social History Narrative   He lives with his sister , [name] and she is his MPOA after his stokes   Social Determinants of Health   Financial Resource Strain: High Risk (03/20/2020)   Overall Financial Resource Strain (CARDIA)    Difficulty of Paying Living Expenses: Hard  Food Insecurity: No Food Insecurity (09/27/2022)   Hunger Vital Sign    Worried About Running Out of Food in the Last Year: Never true    Ran Out of Food in the Last Year: Never true  Transportation Needs: Unmet Transportation Needs (09/27/2022)   PRAPARE - Administrator, Civil Service (Medical): No    Lack of Transportation (Non-Medical): Yes  Physical Activity: Not on file  Stress: Not on file  Social Connections: Not on file  Intimate Partner Violence: Not At Risk (09/27/2022)   Humiliation, Afraid, Rape, and Kick questionnaire    Fear of Current or Ex-Partner: No    Emotionally Abused: No    Physically Abused: No    Sexually Abused: No    FAMILY HISTORY: Family History  Problem Relation Age of Onset   Diabetes Mother    Stroke Mother    Arthritis Mother    Depression Mother    Heart disease Mother    Hypertension Mother    Learning disabilities Mother    Mental illness Mother    Sleep apnea Mother    Diabetes Father    Heart disease Father    Arthritis Father    Hearing loss Father    Hyperlipidemia Father    Heart attack Father    Hypertension Father    Stroke Father    Diabetes Sister    Depression Sister    Diabetes Sister    Hypertension Sister    Mental illness Sister    Sleep apnea Sister    Diabetes Maternal Grandmother    Heart disease Maternal Grandmother    Depression Maternal Grandmother    Hyperlipidemia Maternal Grandmother    Hypertension Maternal Grandmother    Stroke  Maternal Grandmother    Hypertension Maternal Grandfather    Hyperlipidemia Maternal Grandfather    Stroke Maternal Grandfather    Arthritis Paternal Grandmother    Hearing loss Paternal Grandmother    Stroke Paternal Grandfather    Heart disease Paternal Grandfather    Arthritis Paternal Grandfather    Heart attack Paternal Grandfather    Melanoma Other     ALLERGIES:  is allergic to pollen extract and sulfa antibiotics.  MEDICATIONS:  No current facility-administered medications for this visit.   No current outpatient medications on file.   Facility-Administered Medications Ordered in Other Visits  Medication Dose Route Frequency Provider Last Rate Last Admin   0.9 %  sodium chloride infusion   Intravenous Continuous Jaynie Collins, DO 20 mL/hr at 09/27/22 1721 New Bag at 09/27/22 1721   acetaminophen (TYLENOL) tablet 650 mg  650 mg Oral Q6H PRN Lorretta Harp, MD       atorvastatin (LIPITOR) tablet 40 mg  40 mg Oral q1800 Lorretta Harp, MD   40 mg at 09/27/22 1804   busPIRone (BUSPAR) tablet 7.5 mg  7.5 mg Oral BID Lorretta Harp, MD       feeding supplement (BOOST / RESOURCE BREEZE) liquid 1 Container  1 Container Oral TID BM Lorretta Harp, MD       hydrALAZINE (APRESOLINE) injection 5 mg  5 mg Intravenous Q2H PRN Lorretta Harp, MD       insulin aspart (novoLOG) injection 0-5 Units  0-5 Units Subcutaneous QHS Lorretta Harp, MD       insulin aspart (novoLOG) injection 0-9 Units  0-9 Units Subcutaneous TID WC Lorretta Harp, MD       iron sucrose (VENOFER) 200 mg in sodium chloride 0.9 % 100 mL IVPB  200 mg Intravenous Q24H Lorretta Harp, MD 440 mL/hr at 09/27/22 1629 200 mg at 09/27/22 1629   LORazepam (ATIVAN) injection 0.5 mg  0.5 mg Intravenous Once Lorretta Harp, MD       LORazepam (ATIVAN) tablet 0.5 mg  0.5 mg Oral Q6H PRN Lorretta Harp, MD       nystatin (MYCOSTATIN/NYSTOP) topical powder   Topical BID Lorretta Harp, MD       ondansetron [facility]) injection 4 mg  4 mg Intravenous Q8H PRN Lorretta Harp, MD        oxyCODONE (Oxy IR/ROXICODONE) immediate release tablet 5 mg  5 mg Oral Q8H PRN Lorretta Harp, MD       pantoprazole (PROTONIX) injection 40 mg  40 mg Intravenous Willette Pa, MD       [START ON 09/28/2022] torsemide (DEMADEX) tablet 40 mg  40 mg Oral Daily Lorretta Harp, MD        Review of Systems  Constitutional:  Positive for appetite change, fatigue and unexpected weight change. Negative for chills and fever.  HENT:   Negative for hearing loss and voice change.   Eyes:  Negative for eye problems and icterus.  Respiratory:  Negative for chest tightness, cough and shortness of breath.   Cardiovascular:  Negative for chest pain and leg swelling.  Gastrointestinal:  Positive for nausea. Negative for abdominal distention and abdominal pain.  Endocrine: Negative for hot flashes.  Genitourinary:  Negative for difficulty urinating, dysuria and frequency.   Musculoskeletal:  Negative for arthralgias.  Skin:  Negative for itching and rash.  Neurological:  Negative for light-headedness and numbness.  Hematological:  Negative for adenopathy. Does not bruise/bleed easily.  Psychiatric/Behavioral:  Negative for confusion.      PHYSICAL EXAMINATION: ECOG PERFORMANCE STATUS: 2 - Symptomatic, <50% confined to bed  Vitals:   09/27/22 0911  BP: 119/72  Pulse: (!) 112  Resp: 18  Temp: (!) 96.2 F (35.7 C)  SpO2: 100%   Filed Weights   09/27/22 0911  Weight: 237 lb 6.4 oz (107.7 kg)    Physical Exam Constitutional:      General: He is not in acute distress.    Appearance: He is not diaphoretic.  HENT:     Head: Normocephalic and atraumatic.  Eyes:     General: No scleral icterus.    Pupils: Pupils  are equal, round, and reactive to light.  Cardiovascular:     Rate and Rhythm: Tachycardia present.  Pulmonary:     Effort: Pulmonary effort is normal. No respiratory distress.     Breath sounds: No wheezing.  Abdominal:     General: There is no distension.     Palpations: Abdomen  is soft.     Tenderness: There is no abdominal tenderness.  Musculoskeletal:        General: Normal range of motion.     Cervical back: Normal range of motion and neck supple.  Skin:    General: Skin is warm and dry.     Coloration: Skin is pale.  Neurological:     Mental Status: He is alert and oriented to person, place, and time. Mental status is at baseline.     Cranial Nerves: No cranial nerve deficit.     Motor: No abnormal muscle tone.  Psychiatric:        Mood and Affect: Affect normal.     Comments: tearful      LABORATORY DATA:  I have reviewed the data as listed    Latest Ref Rng & Units 09/27/2022    4:50 PM 09/27/2022   11:10 AM 09/27/2022    8:49 AM  CBC  WBC 4.0 - 10.5 K/uL 7.1  6.9  6.9   Hemoglobin 13.0 - 17.0 g/dL 7.8  7.2  7.3   Hematocrit 39.0 - 52.0 % 25.7  24.5  23.9   Platelets 150 - 400 K/uL 250  299  318       Latest Ref Rng & Units 09/27/2022   11:10 AM 09/27/2022    8:49 AM 09/10/2022    5:41 AM  CMP  Glucose 70 - 99 mg/dL 409  811  92   BUN 6 - 20 mg/dL 42  41  20   Creatinine 0.61 - 1.24 mg/dL 9.14  7.82  9.56   Sodium 135 - 145 mmol/L 137  133  130   Potassium 3.5 - 5.1 mmol/L 5.0  4.4  4.0   Chloride 98 - 111 mmol/L 103  100  100   CO2 22 - 32 mmol/L 23  23  18    Calcium 8.9 - 10.3 mg/dL 8.9  8.5  7.7   Total Protein 6.5 - 8.1 g/dL  7.1    Total Bilirubin 0.3 - 1.2 mg/dL  0.6    Alkaline Phos 38 - 126 U/L  64    AST 15 - 41 U/L  37    ALT 0 - 44 U/L  11       RADIOGRAPHIC STUDIES: I have personally reviewed the radiological images as listed and agreed with the findings in the report. PERIPHERAL VASCULAR CATHETERIZATION  Result Date: 09/25/2022 See surgical note for result.  NM PET Image Initial (PI) Skull Base To Thigh  Result Date: 09/23/2022 CLINICAL DATA:  Initial treatment strategy for peritoneal carcinomatosis. EXAM: NUCLEAR MEDICINE PET SKULL BASE TO THIGH TECHNIQUE: 13.2 mCi F-18 FDG was injected intravenously. Full-ring PET  imaging was performed from the skull base to thigh after the radiotracer. CT data was obtained and used for attenuation correction and anatomic localization. Fasting blood glucose: 120 mg/dl COMPARISON:  CT abdomen pelvis 09/08/2022. FINDINGS: Mediastinal blood pool activity: SUV max 2.4 Liver activity: SUV max 3.4 NECK: No abnormal hypermetabolism. Incidental CT findings: None. CHEST: Hypermetabolic thoracic inlet, mediastinal and distal periesophageal adenopathy. Index high right paratracheal lymph node measures 1.6 cm, SUV max 18.7.  Incidental CT findings: Atherosclerotic calcification of the aorta, aortic valve and coronary arteries. Enlarged pulmonic trunk and heart. No pericardial or pleural effusion. Scattered tiny pulmonary nodules are too small for PET resolution. ABDOMEN/PELVIS: Proximal gastric masslike wall thickening with associated hypermetabolism, SUV max 16.8. Extensive hypermetabolic retrocrural, abdominal peritoneal ligament, abdominal retroperitoneal and small bowel mesenteric adenopathy. Index left periaortic lymph node measures 1.6 cm (4/98), SUV max 14.4. Hypermetabolic omental nodularity with an index nodule in the left upper quadrant measuring 5 mm, SUV max 4.9. No additional abnormal hypermetabolism. Incidental CT findings: Liver is enlarged, 19.6 cm, and the margin is irregular. Stones in the gallbladder. Adrenal glands are unremarkable. Bilateral renal stones and renal vascular calcifications. Spleen and pancreas are grossly unremarkable. Unobstructed small bowel extends into a large umbilical hernia with a wide neck. A second left paramidline umbilical hernia contains fat. SKELETON: No abnormal hypermetabolism. Incidental CT findings: Degenerative changes in the spine. Probable vertebral body hemangiomas. IMPRESSION: 1. 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 2. Enlarged cirrhotic liver. 3. Cholelithiasis. 4. Bilateral renal stones. 5.  Umbilical hernias contain unobstructed small bowel and/or fat. 6. Aortic atherosclerosis (ICD10-I70.0). Coronary artery calcification. 7. Enlarged pulmonic trunk, indicative of pulmonary arterial hypertension. Electronically Signed   By: Leanna Battles M.D.   On: 09/23/2022 11:44   CT ABDOMEN PELVIS W CONTRAST  Result Date: 09/08/2022 CLINICAL DATA:  Worsening abdominal pain. EXAM: CT ABDOMEN AND PELVIS WITH CONTRAST TECHNIQUE: Multidetector CT imaging of the abdomen and pelvis was performed using the standard protocol following bolus administration of intravenous contrast. RADIATION DOSE REDUCTION: This exam was performed according to the departmental dose-optimization program which includes automated exposure control, adjustment of the mA and/or kV according to patient size and/or use of iterative reconstruction technique. CONTRAST:  OMNIPAQUE IOHEXOL 350 MG/ML SOLN COMPARISON:  Noncontrast CT 02/20/2020. FINDINGS: Lower chest: Breathing motion at the lung bases. No pleural effusion. There are several small lung nodules identified. Example middle lobe series 4, image 17 measuring 5 mm. Other lesions identified are also similar to smaller in size. Distribution is similar to the previous examination. Over 2 years of stability. No specific imaging follow-up. Heart is enlarged. Coronary artery calcifications are seen. Please correlate for other coronary risk factors. Hepatobiliary: No enhancing liver lesion. Patent portal vein. Stones in the nondilated gallbladder. Pancreas: Mild atrophy of the pancreatic parenchyma. Spleen: Normal in size without focal abnormality. Adrenals/Urinary Tract: Stable left adrenal myelolipoma. Right adrenal gland is preserved. No enhancing renal mass or collecting system dilatation. The calcifications seen along each kidney actually could be vascular based on the distribution. No collecting system dilatation. The ureters have normal course and caliber down to the bladder.  Preserved contours of the urinary bladder. Stomach/Bowel: No oral contrast. The large bowel has a normal course and caliber with scattered stool and diverticula. Normal appendix. Small bowel is nondilated. There is nodular wall thickening along the upper stomach and GE junction region. Please correlate for a malignant lesion. Vascular/Lymphatic: Normal caliber aorta and IVC with scattered vascular calcifications. Extensive abnormal lymph node enlargement identified. This includes retrocrural on series 2, image 13 measuring 19 x 14 mm. Gastrohepatic ligament, periceliac region node left of midline on series 2, image 23 measuring 4.0 by 3.5 cm. Porta hepatic node series 2, image 25 measuring 3.4 by 2.4 cm. Left para-aortic lymph node more caudal on series 2, image 35 measuring 2.8 by 2.2 cm. Several other nodes are seen throughout these locations. There is also some mesenteric  nodes, including along the greater curve of the stomach. Reproductive: Prostate is unremarkable. Other: Large midline anterior pelvic wall hernia with rectus muscle diastasis. Herniation of small bowel and significant mesenteric fat. Scattered stranding. There are areas of nodularity along the peritoneum anteriorly such as left upper quadrant image 35 of series 2. There is some thickening of the lateral conal fascia on the left side. Please correlate for evidence of peritoneal carcinomatosis. No significant ascites. No free air. Musculoskeletal: Scattered degenerative changes of the spine and pelvis. Transitional lumbosacral segment. Skin thickening along the anterior pelvic wall and stranding. Mild anasarca. IMPRESSION: Diffuse masslike thickening in the area of the GE junction and upper stomach with the extensive abnormal lymph nodes throughout the abdomen including retrocrural, porta hepatis, retroperitoneum and lesser and greater curve of the stomach. In addition there are areas of suggest peritoneal carcinomatosis. Recommend further  evaluation. No bowel obstruction. Large midline anterior pelvic wall hernia involving small bowel and mesenteric fat with rectus muscle diastasis. Gallstones. Critical Value/emergent results were called by telephone at the time of interpretation on 09/08/2022 at 5:40 pm to provider [facility] , who verbally acknowledged these results. Electronically Signed   By: Karen Kays M.D.   On: 09/08/2022 20:52

## 2022-09-27 NOTE — Assessment & Plan Note (Signed)
Continue antiemetics PRN as instructed

## 2022-09-27 NOTE — Assessment & Plan Note (Signed)
Recommend Ativan 0.5 mg every 12 hours as needed for anxiety/sleep/nausea 

## 2022-09-27 NOTE — Assessment & Plan Note (Signed)
Refer to nutritionist 

## 2022-09-27 NOTE — ED Notes (Signed)
CONSENT DONE, IN PT'S CUBBY

## 2022-09-27 NOTE — Progress Notes (Signed)
Patient received from ED via bed in stable condition. Patient is alert and oriented X4. He has been receiving blood continuous from ED. Patient tolerate well. No any other symptoms noted.

## 2022-09-27 NOTE — H&P (Signed)
History and Physical    Jeremiah Vance YQM:[id number] DOB: May 16, 1970 DOA: 09/27/2022  Referring MD/NP/PA:   PCP: Debera Lat, PA-C   Patient coming from:  The patient is coming from home.     Chief Complaint: Weakness  HPI: Jeremiah Vance is a 52 y.o. male with medical history significant of GE junction cancer- stage IV, HTN, HLD, CHF with EF 30-35%, stroke, gout, depression with anxiety, CKD-3B, obesity, A-fib on Eliquis, who presents with weakness.  Patient states that he has generalized weakness for more than 2 days, which has been progressively worsening.  Patient was in cancer center for starting his first dose of chemotherapy today, but was found to have low hemoglobin 7.3, and sent to ED for further evaluation and treatment.  Patient denies dark stool or rectal bleeding.  No nausea, vomiting or abdominal pain.  Denies chest pain, shortness breath, cough, fever or chills.  Denies dizziness or lightheadedness.  Patient's Eliquis has been on hold in the past several days for Port-A placement.   Data reviewed independently and ED Course: pt was found to have hemoglobin 7.2 (9.3 on 09/19/2022),  BNP 271, slightly worsening renal function, no fever, temperature 101/70, heart rate 112 --> 91, RR 18, oxygen saturation 95% on room air.  Patient is placed on telemetry bed for observation.  Dr. Timothy Lasso of GI is consulted.   EKG: I have personally reviewed.  Atrial fibrillation, QTc 409, LAD, poor R wave progression   Review of Systems:   General: no fevers, chills, no body weight gain, has fatigue HEENT: no blurry vision, hearing changes or sore throat Respiratory: no dyspnea, coughing, wheezing CV: no chest pain, no palpitations GI: no nausea, vomiting, abdominal pain, diarrhea, constipation GU: no dysuria, burning on urination, increased urinary frequency, hematuria  Ext: has trace leg edema Neuro: no unilateral weakness, numbness, or tingling, no vision change or hearing loss Skin:  no rash, no skin tear. MSK: No muscle spasm, no deformity, no limitation of range of movement in spin Heme: No easy bruising.  Travel history: No recent long distant travel.   Allergy:  Allergies  Allergen Reactions   Pollen Extract Other (See Comments)   Sulfa Antibiotics     Past Medical History:  Diagnosis Date   Acute ischemic stroke (HCC) 09/06/2017   Acute renal failure superimposed on stage 3a chronic kidney disease (HCC) 07/08/2019   Acute right-sided weakness    Allergies    Anasarca    Anemia 07/10/2017   Arthritis    CHF (congestive heart failure) (HCC)    "coinsided w/kidney problems I was having 06/2017"   Chicken pox    CKD (chronic kidney disease) stage 3, GFR 30-59 ml/min (HCC) 06/2017   Depression    Elevated troponin 07/08/2019   High cholesterol    History of cardiomyopathy    LVEF 40 to 45% in April 2019 - subsequently normalized   Hyperbilirubinemia 07/10/2017   Hypertension    Ischemic stroke (HCC)    Small left internal capsule infarct due to lacunar disease   Morbid obesity (HCC)    Normocytic anemia 12/16/2017   Recurrent incisional hernia with incarceration s/p repair 10/22/2017 10/21/2017   Stroke (HCC) 08/2017   "right sided weakness since; getting stronger though" (10/22/2017)   Type 2 diabetes mellitus (HCC)     Past Surgical History:  Procedure Laterality Date   ABDOMINAL HERNIA REPAIR  2008; 10/22/2017   "scope; OPEN REPAIR INCARCERATED VENTRAL HERNIA   ESOPHAGOGASTRODUODENOSCOPY (EGD) WITH PROPOFOL N/A  09/10/2022   Procedure: ESOPHAGOGASTRODUODENOSCOPY (EGD) WITH PROPOFOL;  Surgeon: Toney Reil, MD;  Location: [facility];  Service: Gastroenterology;  Laterality: N/A;   HERNIA REPAIR     KNEE ARTHROSCOPY Right 1989   PORTA CATH INSERTION N/A 09/25/2022   Procedure: PORTA CATH INSERTION;  Surgeon: Annice Needy, MD;  Location: [facility] INVASIVE CV LAB;  Service: Cardiovascular;  Laterality: N/A;   VENTRAL HERNIA REPAIR N/A 10/22/2017    Procedure: OPEN REPAIR INCARCERATED VENTRAL HERNIA;  Surgeon: Glenna Fellows, MD;  Location: [facility] OR;  Service: General;  Laterality: N/A;    Social History:  reports that he has never smoked. He has never used smokeless tobacco. He reports that he does not currently use alcohol. He reports that he does not use drugs.  Family History:  Family History  Problem Relation Age of Onset   Diabetes Mother    Stroke Mother    Arthritis Mother    Depression Mother    Heart disease Mother    Hypertension Mother    Learning disabilities Mother    Mental illness Mother    Sleep apnea Mother    Diabetes Father    Heart disease Father    Arthritis Father    Hearing loss Father    Hyperlipidemia Father    Heart attack Father    Hypertension Father    Stroke Father    Diabetes Sister    Depression Sister    Diabetes Sister    Hypertension Sister    Mental illness Sister    Sleep apnea Sister    Diabetes Maternal Grandmother    Heart disease Maternal Grandmother    Depression Maternal Grandmother    Hyperlipidemia Maternal Grandmother    Hypertension Maternal Grandmother    Stroke Maternal Grandmother    Hypertension Maternal Grandfather    Hyperlipidemia Maternal Grandfather    Stroke Maternal Grandfather    Arthritis Paternal Grandmother    Hearing loss Paternal Grandmother    Stroke Paternal Grandfather    Heart disease Paternal Grandfather    Arthritis Paternal Grandfather    Heart attack Paternal Grandfather    Melanoma Other      Prior to Admission medications   Medication Sig Start Date End Date Taking? Authorizing Provider  amLODipine (NORVASC) 10 MG tablet Take 10 mg by mouth daily. Patient not taking: Reported on 09/25/2022 06/03/22   [provider]  apixaban (ELIQUIS) 5 MG TABS tablet Take 1 tablet (5 mg total) by mouth 2 (two) times daily. 09/11/22   Lurene Shadow, MD  atorvastatin (LIPITOR) 40 MG tablet Take 1 tablet (40 mg total) by mouth daily at 6 PM.  11/26/21   Enedina Finner, MD  busPIRone (BUSPAR) 7.5 MG tablet Take 1 tablet (7.5 mg total) by mouth 2 (two) times daily. 09/09/22   Debera Lat, PA-C  Continuous Blood Gluc Sensor (FREESTYLE LIBRE 2 SENSOR) MISC Place 1 sensor on the skin every 14 days. Use to check glucose continuously 05/01/22   Debera Lat, PA-C  dapagliflozin propanediol (FARXIGA) 10 MG TABS tablet Take 1 tablet (10 mg) by mouth once daily/please contact office to schedule follow up prior to further refills. 08/27/22   Debbe Odea, MD  dexamethasone (DECADRON) 4 MG tablet Take 2 tablets (8 mg total) by mouth daily. Start the day after chemotherapy for 2 days. Take with food. 09/19/22   Rickard Patience, MD  hydrALAZINE (APRESOLINE) 50 MG tablet Take 1 tablet (50 mg total) by mouth in the morning and at bedtime.  please contact office to schedule follow up prior to further refills. 08/27/22   Debbe Odea, MD  irbesartan (AVAPRO) 300 MG tablet Take 300 mg by mouth daily.    [provider]  lidocaine-prilocaine (EMLA) cream Apply to affected area once Patient not taking: Reported on 09/27/2022 09/19/22   Rickard Patience, MD  LORazepam (ATIVAN) 0.5 MG tablet Take 1 tablet (0.5 mg total) by mouth every 12 (twelve) hours as needed for anxiety or sleep (nausea vomiting.). Patient not taking: Reported on 09/27/2022 09/19/22   Rickard Patience, MD  ondansetron (ZOFRAN) 8 MG tablet Take 1 tablet (8 mg total) by mouth every 8 (eight) hours as needed for nausea or vomiting. Start on the third day after chemotherapy. Patient not taking: Reported on 09/27/2022 09/19/22   Rickard Patience, MD  ondansetron (ZOFRAN-ODT) 8 MG disintegrating tablet Take 1 tablet (8 mg total) by mouth every 8 (eight) hours as needed for nausea or vomiting. Take 72 hours after chemotherapy if needed. Patient not taking: Reported on 09/27/2022 09/19/22   Rickard Patience, MD  oxyCODONE (ROXICODONE) 5 MG immediate release tablet Take 1 tablet (5 mg total) by mouth every 8 (eight) hours as  needed for moderate pain. Patient not taking: Reported on 09/27/2022 09/24/22   Rickard Patience, MD  prochlorperazine (COMPAZINE) 10 MG tablet Take 1 tablet (10 mg total) by mouth every 6 (six) hours as needed for nausea or vomiting. Patient not taking: Reported on 09/27/2022 09/19/22   Rickard Patience, MD  torsemide (DEMADEX) 20 MG tablet Take 2 tablets (40 mg total) by mouth daily. Patient not taking: Reported on 09/27/2022 11/26/21   Enedina Finner, MD    Physical Exam: Vitals:   09/27/22 1400 09/27/22 1423 09/27/22 1500 09/27/22 1615  BP: 122/82 129/78 129/78 126/75  Pulse: (!) 105 88 88 97  Resp: 19 18 18 18   Temp:  98.8 F (37.1 C) 98.8 F (37.1 C) 98 F (36.7 C)  TempSrc:  Oral Oral Oral  SpO2: 96% 97%  95%  Weight:   107.7 kg   Height:   5\' 10"  (1.778 m)    General: Not in acute distress. Pale looking HEENT:       Eyes: PERRL, EOMI, no jaundice       ENT: No discharge from the ears and nose, no pharynx injection, no tonsillar enlargement.        Neck: No JVD, no bruit, no mass felt. Heme: No neck lymph node enlargement. Cardiac: S1/S2, RRR, No murmurs, No gallops or rubs. Respiratory: No rales, wheezing, rhonchi or rubs. GI: Soft, nondistended, nontender, no rebound pain, no organomegaly, BS present. GU: No hematuria Ext: has trace leg edema bilaterally. 1+DP/PT pulse bilaterally. Musculoskeletal: No joint deformities, No joint redness or warmth, no limitation of ROM in spin. Skin: No rashes.  Neuro: Alert, oriented X3, cranial nerves II-XII grossly intact, moves all extremities normally. Psych: Patient is not psychotic, no suicidal or hemocidal ideation.  Labs on Admission: I have personally reviewed following labs and imaging studies  CBC: Recent Labs  Lab 09/27/22 0849 09/27/22 1110 09/27/22 1650  WBC 6.9 6.9 7.1  NEUTROABS 5.0  --   --   HGB 7.3* 7.2* 7.8*  HCT 23.9* 24.5* 25.7*  MCV 81.8 84.2 82.1  PLT 318 299 250   Basic Metabolic Panel: Recent Labs  Lab 09/27/22 0849  09/27/22 1110  NA 133* 137  K 4.4 5.0  CL 100 103  CO2 23 23  GLUCOSE 228* 194*  BUN 41* 42*  CREATININE 1.78* 1.90*  CALCIUM 8.5* 8.9  MG  --  2.4   GFR: Estimated Creatinine Clearance: 55.9 mL/min (A) (by C-G formula based on SCr of 1.9 mg/dL (H)). Liver Function Tests: Recent Labs  Lab 09/27/22 0849  AST 37  ALT 11  ALKPHOS 64  BILITOT 0.6  PROT 7.1  ALBUMIN 2.9*   No results for input(s): "LIPASE", "AMYLASE" in the last 168 hours. No results for input(s): "AMMONIA" in the last 168 hours. Coagulation Profile: No results for input(s): "INR", "PROTIME" in the last 168 hours. Cardiac Enzymes: No results for input(s): "CKTOTAL", "CKMB", "CKMBINDEX", "TROPONINI" in the last 168 hours. BNP (last 3 results) Recent Labs    11/28/21 1520  PROBNP 966*   HbA1C: No results for input(s): "HGBA1C" in the last 72 hours. CBG: Recent Labs  Lab 09/25/22 1023 09/27/22 1640  GLUCAP 141* 104*   Lipid Profile: Recent Labs    09/27/22 1200  CHOL 167  HDL 24*  LDLCALC 115*  TRIG 139  CHOLHDL 7.0   Thyroid Function Tests: No results for input(s): "TSH", "T4TOTAL", "FREET4", "T3FREE", "THYROIDAB" in the last 72 hours. Anemia Panel: No results for input(s): "VITAMINB12", "FOLATE", "FERRITIN", "TIBC", "IRON", "RETICCTPCT" in the last 72 hours. Urine analysis:    Component Value Date/Time   COLORURINE YELLOW (A) 09/09/2022 0630   APPEARANCEUR CLEAR (A) 09/09/2022 0630   LABSPEC 1.019 09/09/2022 0630   PHURINE 5.0 09/09/2022 0630   GLUCOSEU NEGATIVE 09/09/2022 0630   HGBUR NEGATIVE 09/09/2022 0630   BILIRUBINUR NEGATIVE 09/09/2022 0630   BILIRUBINUR negative 05/19/2020 1618   KETONESUR NEGATIVE 09/09/2022 0630   PROTEINUR 30 (A) 09/09/2022 0630   UROBILINOGEN 0.2 05/19/2020 1618   UROBILINOGEN 2.0 (H) 10/21/2017 1102   NITRITE NEGATIVE 09/09/2022 0630   LEUKOCYTESUR NEGATIVE 09/09/2022 0630   Sepsis Labs: @LABRCNTIP (procalcitonin:4,lacticidven:4) )No results found  for this or any previous visit (from the past 240 hour(s)).   Radiological Exams on Admission: No results found.    Assessment/Plan Principal Problem:   Symptomatic anemia Active Problems:   Iron deficiency anemia   GE junction carcinoma (HCC)   Atrial fibrillation, chronic (HCC)   Myocardial injury   Hyperlipidemia   Stroke (HCC)   HTN (hypertension)   Chronic combined systolic and diastolic congestive heart failure (HCC)   Chronic kidney disease, stage 3b (HCC)   Anxiety and depression   Type II diabetes mellitus with renal manifestations (HCC)   Morbid obesity (HCC)   Assessment and Plan:  Symptomatic anemia and Iron deficiency anemia: Hgb 9.3 on 09/19/22 --> 7.2 today.  May due to GE junction carcinoma related bleeding.  His oncologist, Dr. Cathie Hoops recommended to give patient Venofer daily x 2 dose.  Consulted Dr. Timothy Lasso for GI.  - Place on telemetry bed for observation - Hold Eliquis - transfuse 1 unit of blood now - IVF: will not give IV due to EF 30-35% - Start IV pantoprazole 40 mg bid - Zofran IV for nausea - Avoid NSAIDs and SQ heparin - Maintain IV access (2 large bore IVs if possible). - Monitor closely and follow q6h cbc, transfuse as necessary, if Hgb<7.0 - LaB: INR, PTT and type screen  GE junction carcinoma (HCC): stage IV. Pt is supposed to start first chemotherapy today, which is canceled due to worsening anemia. -Follow-up with Dr. Cathie Hoops of oncology  Myocardial injury: Troponin level 41, no chest pain -Will not give aspirin due to possible GI bleeding -Check A1c, FLP -Lipitor  Chronic A fib: HR 112--> 90s -  hold Eliquis -tele monitoring  Hyperlipidemia -Lipitor  Stroke (HCC) -hold Eliquis -lipitor  HTN (hypertension): Blood pressure 110/70 -Hold irbesartan and oral hydralazine since patient is at risk of developing hypotension due to possible GI bleeding -IV hydro is not as needed  Chronic combined systolic and diastolic congestive heart failure  (HCC): 2D echo 11/23/2021 showed EF of 30 to 35% with grade 1 diastolic dysfunction.  Patient has trace leg edema, does not seem to have CHF exacerbation.  BNP is mildly elevated to 71. -Give 40 mg of IV Lasix now since patient will receive 1 unit of blood -Torsemide 40 mg daily tomorrow  Chronic kidney disease, stage 3b [facility]): Renal function slightly worsening baseline.  Recent baseline creatinine 1.4-1.8.  His creatinine is 1.90, BUN 42, GFR 42 -Avoid using renal toxic medications  Anxiety and depression -As needed Ativan -BuSpar  Diabetes mellitus with renal complication: Recent A1c 6.1, well-controlled.  Patient is taking [place] -Sliding scale insulin   Morbid obesity (HCC): Body weight 107.7 kg, BMI 34.06 -Encourage losing weight -Signs and healthy diet       DVT ppx: SCD  Code Status: Full code    Family Communication:  Yes, patient's sister  at bed side.    Disposition Plan:  Anticipate discharge back to previous environment  Consults called: Dr. Timothy Lasso of GI   Admission status and Level of care: Telemetry Medical:    for obs    Dispo: The patient is from: Home              Anticipated d/c is to: Home              Anticipated d/c date is: 1 day              Patient currently is not medically stable to d/c.    Severity of Illness:  The appropriate patient status for this patient is OBSERVATION. Observation status is judged to be reasonable and necessary in order to provide the required intensity of service to ensure the patient's safety. The patient's presenting symptoms, physical exam findings, and initial radiographic and laboratory data in the context of their medical condition is felt to place them at decreased risk for further clinical deterioration. Furthermore, it is anticipated that the patient will be medically stable for discharge from the hospital within 2 midnights of admission.        Date of Service 09/27/2022    Lorretta Harp [facility]   If 7PM-7AM, please contact night-coverage [email or web address] 09/27/2022, 5:23 PM

## 2022-09-27 NOTE — Progress Notes (Signed)
[facility] Clinical Social Work  Initial Assessment   Duglas D Mcsween is a 52 y.o. year old male accompanied by sister. Clinical Social Work was referred by medical provider for assessment of psychosocial needs.   SDOH (Social Determinants of Health) assessments performed: Yes SDOH Interventions    Flowsheet Row Office Visit from 12/04/2021 in [facility] Chronic Care Management from 03/20/2020 in [facility] Chronic Care Management from 03/09/2020 in [facility] at [facility] Chronic Care Management from 09/17/2019 in [facility] at [facility] Chronic Care Management from 09/16/2019 in [facility]  SDOH Interventions       Transportation Interventions -- -- Other (Comment)  Lavinia Sharps guide and HR medicaid referral placed] Other (Comment)  [care guide referral] --  Depression Interventions/Treatment  Referral to Psychiatry -- -- -- --  Financial Strain Interventions -- Other (Comment)  [patient notes that he has a new plan taking effect, has not received card] -- Other (Comment)  [medication management referral] Other (Comment)  [Medication Management Clinic referral]       SDOH Screenings   Food Insecurity: No Food Insecurity (09/10/2022)  Housing: Low Risk  (09/10/2022)  Transportation Needs: Unmet Transportation Needs (09/10/2022)  Utilities: Not At Risk (09/10/2022)  Alcohol Screen: Low Risk  (11/28/2021)  Depression (PHQ2-9): Medium Risk (05/01/2022)  Financial Resource Strain: High Risk (03/20/2020)  Tobacco Use: Low Risk  (09/27/2022)     Distress Screen completed: No    09/19/2022    2:34 PM  ONCBCN DISTRESS SCREENING  Screening Type Initial Screening  Distress experienced in past week (1-10) 0      Family/Social Information:  Housing Arrangement: patient lives with his older sister, [name]. Family members/support persons in  your life? Family.   Transportation concerns: yes.  His sister drives him since he can't drive. Employment: Disabled.  Patient has filed for disability. Income source: Supported by [facility] and [employer] Financial concerns: Yes, due to illness and/or loss of work during treatment Type of concern:  Patient is worried about contributing financially to his sister for helping him. Food access concerns: no Religious or spiritual practice: Not known Services Currently in place:  Medicaid  Coping/ Adjustment to diagnosis: Patient understands treatment plan and what happens next? yes Concerns about diagnosis and/or treatment: Afraid of cancer Patient reported stressors: Depression Hopes and/or priorities: Family Patient enjoys time with family/ friends Current coping skills/ strengths: Manufacturing systems engineer  and Supportive family/friends     SUMMARY: Current SDOH Barriers:  Financial constraints related to loss of income.  Clinical Social Work Clinical Goal(s):  Demonstrate a reduction in symptoms related to :Depression   Interventions: Discussed common feeling and emotions when being diagnosed with cancer, and the importance of support during treatment Informed patient of the support team roles and support services at [facility] Provided CSW contact information and encouraged patient to call with any questions or concerns Provided patient with information about CSW role.  Validated his feelings and attempted to provide additional coping skills.   Follow Up Plan: CSW will see patient on Monday, 7/1, when he is in infusion. Patient verbalizes understanding of plan: Yes    Dorothey Baseman, LCSW Clinical Social Worker [facility]  Patient is participating in a Managed Medicaid Plan:  Yes

## 2022-09-27 NOTE — ED Triage Notes (Addendum)
Pt sent over by PCP for blood and iron transfusion. Pt is pale. Hgb is 7.3 per Cancer center results  Pt states weakness for few days. Pt denies any blood in stool or urine. Pt is on thinner.

## 2022-09-27 NOTE — Progress Notes (Signed)
Palliative Medicine [facility] [phone] [phone]) [phone]   Name: Jeremiah Vance Date: 09/27/2022 MRN: [id number]  DOB: Sep 03, 1970  Patient Care Team: Debera Lat, Cordelia Poche as PCP - General (Physician Assistant) Debbe Odea, MD as PCP - Cardiology (Cardiology) Toney Reil, MD as Consulting Physician (Gastroenterology) Toney Reil, MD as Consulting Physician (Gastroenterology) Rickard Patience, MD as Consulting Physician (Oncology) Benita Gutter, RN as Oncology Nurse Navigator    REASON FOR CONSULTATION: Jeremiah Vance is a 52 y.o. 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  Palliative care was consulted to address goals and manage ongoing symptoms.  SOCIAL HISTORY:     reports that he has never smoked. He has never used smokeless tobacco. He reports that he does not currently use alcohol. He reports that he does not use drugs.  Patient is divorced.  He lives at home with his sister.  He has no children  ADVANCE DIRECTIVES:    CODE STATUS:   PAST MEDICAL HISTORY: Past Medical History:  Diagnosis Date   Acute ischemic stroke (HCC) 09/06/2017   Acute renal failure superimposed on stage 3a chronic kidney disease (HCC) 07/08/2019   Acute right-sided weakness    Allergies    Anasarca    Anemia 07/10/2017   Arthritis    CHF (congestive heart failure) (HCC)    "coinsided w/kidney problems I was having 06/2017"   Chicken pox    CKD (chronic kidney disease) stage 3, GFR 30-59 ml/min (HCC) 06/2017   Depression    Elevated troponin 07/08/2019   High cholesterol    History of cardiomyopathy    LVEF 40 to 45% in April 2019 - subsequently normalized   Hyperbilirubinemia 07/10/2017   Hypertension    Ischemic stroke (HCC)    Small left internal capsule infarct due to  lacunar disease   Morbid obesity (HCC)    Normocytic anemia 12/16/2017   Recurrent incisional hernia with incarceration s/p repair 10/22/2017 10/21/2017   Stroke (HCC) 08/2017   "right sided weakness since; getting stronger though" (10/22/2017)   Type 2 diabetes mellitus (HCC)     PAST SURGICAL HISTORY:  Past Surgical History:  Procedure Laterality Date   ABDOMINAL HERNIA REPAIR  2008; 10/22/2017   "scope; OPEN REPAIR INCARCERATED VENTRAL HERNIA   ESOPHAGOGASTRODUODENOSCOPY (EGD) WITH PROPOFOL N/A 09/10/2022   Procedure: ESOPHAGOGASTRODUODENOSCOPY (EGD) WITH PROPOFOL;  Surgeon: Toney Reil, MD;  Location: [facility] ENDOSCOPY;  Service: Gastroenterology;  Laterality: N/A;   HERNIA REPAIR     KNEE ARTHROSCOPY Right 1989   PORTA CATH INSERTION N/A 09/25/2022   Procedure: PORTA CATH INSERTION;  Surgeon: Annice Needy, MD;  Location: [facility] INVASIVE CV LAB;  Service: Cardiovascular;  Laterality: N/A;   VENTRAL HERNIA REPAIR N/A 10/22/2017   Procedure: OPEN REPAIR INCARCERATED VENTRAL HERNIA;  Surgeon: Glenna Fellows, MD;  Location: [facility] OR;  Service: General;  Laterality: N/A;    HEMATOLOGY/ONCOLOGY HISTORY:  Oncology History  Gastric cancer (HCC)  09/19/2022 Cancer Staging   Staging form: Stomach, AJCC 8th Edition - Clinical stage from 09/19/2022: Stage IVB (cTX, cN2, cM1) - Signed by Rickard Patience, MD on 09/19/2022 Stage prefix: Initial diagnosis   09/19/2022 Initial Diagnosis   Gastric cancer (HCC)  09/08/2022, patient presented emergency room for evaluation of abdominal pain and vomiting.  He has also had weight loss.  Dysphagia with  solid food for couple of months.  CT abdomen pelvis showed diffuse masslike thickening of the GE junction and upper stomach with extensive abnormal lymph nodes throughout abdomen.  09/10/2022 upper endoscopy showed malignant appearance gastric tumor at the GE junction and in the cardia Biopsied.  Nodular mucosa in the second portion of duodenum.  Biopsied. GE  junction mass showed moderate to poorly differentiated adenocarcinoma undermining squamous mucosa with focal area of squamous atypia.  Cannot exclude adenosquamous carcinoma.  Intestinal metaplasia is not identified. Duodenum cold biopsy-reactive duodenitis.  Negative for dysplasia and malignancy. Stomach random biopsy negative for H. pylori, intestinal metaplasia, dysplasia and malignancy.  Stomach Cardia mass biopsy showed moderate to poorly differentiated adenocarcinoma.  Patient's case was discussed on tumor board on 09/19/2022.  Per Dr. Oneita Kras, nephrology findings of both GE junction mass and gastric cardia mass or similar. HER2 staining is pending. Molecular testing is pending.     10/02/2022 -  Chemotherapy   Patient is on Treatment Plan : GASTROESOPHAGEAL FOLFOX q14d       ALLERGIES:  is allergic to pollen extract and sulfa antibiotics.  MEDICATIONS:  Current Outpatient Medications  Medication Sig Dispense Refill   amLODipine (NORVASC) 10 MG tablet Take 10 mg by mouth daily. (Patient not taking: Reported on 09/25/2022)     apixaban (ELIQUIS) 5 MG TABS tablet Take 1 tablet (5 mg total) by mouth 2 (two) times daily. 60 tablet 11   atorvastatin (LIPITOR) 40 MG tablet Take 1 tablet (40 mg total) by mouth daily at 6 PM. 90 tablet 3   busPIRone (BUSPAR) 7.5 MG tablet Take 1 tablet (7.5 mg total) by mouth 2 (two) times daily. 60 tablet 3   Continuous Blood Gluc Sensor (FREESTYLE LIBRE 2 SENSOR) MISC Place 1 sensor on the skin every 14 days. Use to check glucose continuously 2 each 11   dapagliflozin propanediol (FARXIGA) 10 MG TABS tablet Take 1 tablet (10 mg) by mouth once daily/please contact office to schedule follow up prior to further refills. 180 tablet 0   dexamethasone (DECADRON) 4 MG tablet Take 2 tablets (8 mg total) by mouth daily. Start the day after chemotherapy for 2 days. Take with food. 30 tablet 1   hydrALAZINE (APRESOLINE) 50 MG tablet Take 1 tablet (50 mg total) by mouth  in the morning and at bedtime. please contact office to schedule follow up prior to further refills. 180 tablet 0   irbesartan (AVAPRO) 300 MG tablet Take 300 mg by mouth daily.     lidocaine-prilocaine (EMLA) cream Apply to affected area once (Patient not taking: Reported on 09/27/2022) 30 g 3   LORazepam (ATIVAN) 0.5 MG tablet Take 1 tablet (0.5 mg total) by mouth every 12 (twelve) hours as needed for anxiety or sleep (nausea vomiting.). (Patient not taking: Reported on 09/27/2022) 60 tablet 0   ondansetron (ZOFRAN) 8 MG tablet Take 1 tablet (8 mg total) by mouth every 8 (eight) hours as needed for nausea or vomiting. Start on the third day after chemotherapy. (Patient not taking: Reported on 09/27/2022) 30 tablet 1   ondansetron (ZOFRAN-ODT) 8 MG disintegrating tablet Take 1 tablet (8 mg total) by mouth every 8 (eight) hours as needed for nausea or vomiting. Take 72 hours after chemotherapy if needed. (Patient not taking: Reported on 09/27/2022) 90 tablet 1   oxyCODONE (ROXICODONE) 5 MG immediate release tablet Take 1 tablet (5 mg total) by mouth every 8 (eight) hours as needed for moderate pain. (Patient not taking: Reported on 09/27/2022) 10  tablet 0   prochlorperazine (COMPAZINE) 10 MG tablet Take 1 tablet (10 mg total) by mouth every 6 (six) hours as needed for nausea or vomiting. (Patient not taking: Reported on 09/27/2022) 30 tablet 1   torsemide (DEMADEX) 20 MG tablet Take 2 tablets (40 mg total) by mouth daily. (Patient not taking: Reported on 09/27/2022) 90 tablet 3   No current facility-administered medications for this visit.    VITAL SIGNS: There were no vitals taken for this visit. There were no vitals filed for this visit.  Estimated body mass index is 34.06 kg/m as calculated from the following:   Height as of an earlier encounter on 09/27/22: 5\' 10"  (1.778 m).   Weight as of an earlier encounter on 09/27/22: 237 lb 6.4 oz (107.7 kg).  LABS: CBC:    Component Value Date/Time   WBC  6.9 09/27/2022 0849   WBC 7.1 09/10/2022 0541   HGB 7.3 (L) 09/27/2022 0849   HGB 11.6 (L) 05/23/2022 1606   HCT 23.9 (L) 09/27/2022 0849   HCT 35.8 (L) 05/23/2022 1606   PLT 318 09/27/2022 0849   PLT 421 05/23/2022 1606   MCV 81.8 09/27/2022 0849   MCV 84 05/23/2022 1606   NEUTROABS 5.0 09/27/2022 0849   NEUTROABS 5.8 05/23/2022 1606   LYMPHSABS 0.9 09/27/2022 0849   LYMPHSABS 2.6 05/23/2022 1606   MONOABS 0.6 09/27/2022 0849   EOSABS 0.2 09/27/2022 0849   EOSABS 0.2 05/23/2022 1606   BASOSABS 0.0 09/27/2022 0849   BASOSABS 0.1 05/23/2022 1606   Comprehensive Metabolic Panel:    Component Value Date/Time   NA 133 (L) 09/27/2022 0849   NA 139 05/23/2022 1606   K 4.4 09/27/2022 0849   CL 100 09/27/2022 0849   CO2 23 09/27/2022 0849   BUN 41 (H) 09/27/2022 0849   BUN 29 (H) 05/23/2022 1606   CREATININE 1.78 (H) 09/27/2022 0849   GLUCOSE 228 (H) 09/27/2022 0849   CALCIUM 8.5 (L) 09/27/2022 0849   AST 37 09/27/2022 0849   ALT 11 09/27/2022 0849   ALKPHOS 64 09/27/2022 0849   BILITOT 0.6 09/27/2022 0849   PROT 7.1 09/27/2022 0849   PROT 6.9 05/23/2022 1606   ALBUMIN 2.9 (L) 09/27/2022 0849   ALBUMIN 3.9 05/23/2022 1606    RADIOGRAPHIC STUDIES: PERIPHERAL VASCULAR CATHETERIZATION  Result Date: 09/25/2022 See surgical note for result.  NM PET Image Initial (PI) Skull Base To Thigh  Result Date: 09/23/2022 CLINICAL DATA:  Initial treatment strategy for peritoneal carcinomatosis. EXAM: NUCLEAR MEDICINE PET SKULL BASE TO THIGH TECHNIQUE: 13.2 mCi F-18 FDG was injected intravenously. Full-ring PET imaging was performed from the skull base to thigh after the radiotracer. CT data was obtained and used for attenuation correction and anatomic localization. Fasting blood glucose: 120 mg/dl COMPARISON:  CT abdomen pelvis 09/08/2022. FINDINGS: Mediastinal blood pool activity: SUV max 2.4 Liver activity: SUV max 3.4 NECK: No abnormal hypermetabolism. Incidental CT findings: None.  CHEST: Hypermetabolic thoracic inlet, mediastinal and distal periesophageal adenopathy. Index high right paratracheal lymph node measures 1.6 cm, SUV max 18.7. Incidental CT findings: Atherosclerotic calcification of the aorta, aortic valve and coronary arteries. Enlarged pulmonic trunk and heart. No pericardial or pleural effusion. Scattered tiny pulmonary nodules are too small for PET resolution. ABDOMEN/PELVIS: Proximal gastric masslike wall thickening with associated hypermetabolism, SUV max 16.8. Extensive hypermetabolic retrocrural, abdominal peritoneal ligament, abdominal retroperitoneal and small bowel mesenteric adenopathy. Index left periaortic lymph node measures 1.6 cm (4/98), SUV max 14.4. Hypermetabolic omental nodularity with an index  nodule in the left upper quadrant measuring 5 mm, SUV max 4.9. No additional abnormal hypermetabolism. Incidental CT findings: Liver is enlarged, 19.6 cm, and the margin is irregular. Stones in the gallbladder. Adrenal glands are unremarkable. Bilateral renal stones and renal vascular calcifications. Spleen and pancreas are grossly unremarkable. Unobstructed small bowel extends into a large umbilical hernia with a wide neck. A second left paramidline umbilical hernia contains fat. SKELETON: No abnormal hypermetabolism. Incidental CT findings: Degenerative changes in the spine. Probable vertebral body hemangiomas. IMPRESSION: 1. 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 2. Enlarged cirrhotic liver. 3. Cholelithiasis. 4. Bilateral renal stones. 5. Umbilical hernias contain unobstructed small bowel and/or fat. 6. Aortic atherosclerosis (ICD10-I70.0). Coronary artery calcification. 7. Enlarged pulmonic trunk, indicative of pulmonary arterial hypertension. Electronically Signed   By: Leanna Battles M.D.   On: 09/23/2022 11:44   CT ABDOMEN PELVIS W CONTRAST  Result Date: 09/08/2022 CLINICAL DATA:  Worsening abdominal pain.  EXAM: CT ABDOMEN AND PELVIS WITH CONTRAST TECHNIQUE: Multidetector CT imaging of the abdomen and pelvis was performed using the standard protocol following bolus administration of intravenous contrast. RADIATION DOSE REDUCTION: This exam was performed according to the departmental dose-optimization program which includes automated exposure control, adjustment of the mA and/or kV according to patient size and/or use of iterative reconstruction technique. CONTRAST:  OMNIPAQUE IOHEXOL 350 MG/ML SOLN COMPARISON:  Noncontrast CT 02/20/2020. FINDINGS: Lower chest: Breathing motion at the lung bases. No pleural effusion. There are several small lung nodules identified. Example middle lobe series 4, image 17 measuring 5 mm. Other lesions identified are also similar to smaller in size. Distribution is similar to the previous examination. Over 2 years of stability. No specific imaging follow-up. Heart is enlarged. Coronary artery calcifications are seen. Please correlate for other coronary risk factors. Hepatobiliary: No enhancing liver lesion. Patent portal vein. Stones in the nondilated gallbladder. Pancreas: Mild atrophy of the pancreatic parenchyma. Spleen: Normal in size without focal abnormality. Adrenals/Urinary Tract: Stable left adrenal myelolipoma. Right adrenal gland is preserved. No enhancing renal mass or collecting system dilatation. The calcifications seen along each kidney actually could be vascular based on the distribution. No collecting system dilatation. The ureters have normal course and caliber down to the bladder. Preserved contours of the urinary bladder. Stomach/Bowel: No oral contrast. The large bowel has a normal course and caliber with scattered stool and diverticula. Normal appendix. Small bowel is nondilated. There is nodular wall thickening along the upper stomach and GE junction region. Please correlate for a malignant lesion. Vascular/Lymphatic: Normal caliber aorta and IVC with  scattered vascular calcifications. Extensive abnormal lymph node enlargement identified. This includes retrocrural on series 2, image 13 measuring 19 x 14 mm. Gastrohepatic ligament, periceliac region node left of midline on series 2, image 23 measuring 4.0 by 3.5 cm. Porta hepatic node series 2, image 25 measuring 3.4 by 2.4 cm. Left para-aortic lymph node more caudal on series 2, image 35 measuring 2.8 by 2.2 cm. Several other nodes are seen throughout these locations. There is also some mesenteric nodes, including along the greater curve of the stomach. Reproductive: Prostate is unremarkable. Other: Large midline anterior pelvic wall hernia with rectus muscle diastasis. Herniation of small bowel and significant mesenteric fat. Scattered stranding. There are areas of nodularity along the peritoneum anteriorly such as left upper quadrant image 35 of series 2. There is some thickening of the lateral conal fascia on the left side. Please correlate for evidence of peritoneal carcinomatosis. No significant ascites.  No free air. Musculoskeletal: Scattered degenerative changes of the spine and pelvis. Transitional lumbosacral segment. Skin thickening along the anterior pelvic wall and stranding. Mild anasarca. IMPRESSION: Diffuse masslike thickening in the area of the GE junction and upper stomach with the extensive abnormal lymph nodes throughout the abdomen including retrocrural, porta hepatis, retroperitoneum and lesser and greater curve of the stomach. In addition there are areas of suggest peritoneal carcinomatosis. Recommend further evaluation. No bowel obstruction. Large midline anterior pelvic wall hernia involving small bowel and mesenteric fat with rectus muscle diastasis. Gallstones. Critical Value/emergent results were called by telephone at the time of interpretation on 09/08/2022 at 5:40 pm to provider [facility] , who verbally acknowledged these results. Electronically Signed   By: Karen Kays M.D.   On:  09/08/2022 20:52    PERFORMANCE STATUS (ECOG) : 2 - Symptomatic, <50% confined to bed  Review of Systems Unless otherwise noted, a complete review of systems is negative.  Physical Exam General: NAD Cardiovascular: regular rate and rhythm Pulmonary: clear ant fields Abdomen: soft, nontender, + bowel sounds GU: no suprapubic tenderness Extremities: no edema, no joint deformities Skin: no rashes Neurological: Left-sided weakness  IMPRESSION: I met with patient and his sister today in clinic.  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  He has started palliative chemotherapy.  Today, he was found to have symptomatic anemia and is being sent to emergency department for blood transfusion.  Introduced palliative care services.  Today, patient says that he is somewhat short of breath, likely attributed to his anemia.  He denies other symptomatic complaints.  Patient in agreement with current scope of treatment.  Discussed establishing advance directives and patient sent home with ACP documents and a MOST form to review.  PLAN: -Continue supportive care -ACP/MOST form reviewed -Follow up telephone visit 1 month   Patient expressed understanding and was in agreement with this plan. He also understands that He can call the clinic at any time with any questions, concerns, or complaints.     Time Total: 15 minutes  Visit consisted of counseling and education dealing with the complex and emotionally intense issues of symptom management and palliative care in the setting of serious and potentially life-threatening illness.Greater than 50%  of this time was spent counseling and coordinating care related to the above assessment and plan.  Signed by: Laurette Schimke, PhD, NP-C

## 2022-09-27 NOTE — Progress Notes (Signed)
Blood transfusion completed . No any sign of blood transfusion reaction noted.

## 2022-09-27 NOTE — Assessment & Plan Note (Signed)
Hold Eliquis due to GI bleeding/anemia

## 2022-09-28 ENCOUNTER — Observation Stay: Payer: Medicaid Other | Admitting: Anesthesiology

## 2022-09-28 ENCOUNTER — Encounter
Admission: EM | Disposition: A | Discharge status: Home / Self Care | Payer: Self-pay | Source: Ambulatory Visit | Attending: Osteopathic Medicine

## 2022-09-28 ENCOUNTER — Encounter: Payer: Self-pay | Admitting: Internal Medicine

## 2022-09-28 DIAGNOSIS — K922 Gastrointestinal hemorrhage, unspecified: Secondary | ICD-10-CM | POA: Diagnosis present

## 2022-09-28 DIAGNOSIS — R531 Weakness: Secondary | ICD-10-CM | POA: Diagnosis not present

## 2022-09-28 DIAGNOSIS — E669 Obesity, unspecified: Secondary | ICD-10-CM | POA: Diagnosis not present

## 2022-09-28 DIAGNOSIS — I130 Hypertensive heart and chronic kidney disease with heart failure and stage 1 through stage 4 chronic kidney disease, or unspecified chronic kidney disease: Secondary | ICD-10-CM | POA: Diagnosis not present

## 2022-09-28 DIAGNOSIS — Z833 Family history of diabetes mellitus: Secondary | ICD-10-CM | POA: Diagnosis not present

## 2022-09-28 DIAGNOSIS — N1831 Chronic kidney disease, stage 3a: Secondary | ICD-10-CM | POA: Diagnosis not present

## 2022-09-28 DIAGNOSIS — I10 Essential (primary) hypertension: Secondary | ICD-10-CM | POA: Diagnosis not present

## 2022-09-28 DIAGNOSIS — E1122 Type 2 diabetes mellitus with diabetic chronic kidney disease: Secondary | ICD-10-CM | POA: Diagnosis not present

## 2022-09-28 DIAGNOSIS — I509 Heart failure, unspecified: Secondary | ICD-10-CM | POA: Diagnosis not present

## 2022-09-28 DIAGNOSIS — C155 Malignant neoplasm of lower third of esophagus: Secondary | ICD-10-CM | POA: Diagnosis not present

## 2022-09-28 DIAGNOSIS — M109 Gout, unspecified: Secondary | ICD-10-CM | POA: Diagnosis not present

## 2022-09-28 DIAGNOSIS — I959 Hypotension, unspecified: Secondary | ICD-10-CM | POA: Diagnosis not present

## 2022-09-28 DIAGNOSIS — I5042 Chronic combined systolic (congestive) and diastolic (congestive) heart failure: Secondary | ICD-10-CM | POA: Diagnosis not present

## 2022-09-28 DIAGNOSIS — F419 Anxiety disorder, unspecified: Secondary | ICD-10-CM | POA: Diagnosis not present

## 2022-09-28 DIAGNOSIS — K222 Esophageal obstruction: Secondary | ICD-10-CM | POA: Diagnosis not present

## 2022-09-28 DIAGNOSIS — I5A Non-ischemic myocardial injury (non-traumatic): Secondary | ICD-10-CM | POA: Diagnosis not present

## 2022-09-28 DIAGNOSIS — C169 Malignant neoplasm of stomach, unspecified: Secondary | ICD-10-CM | POA: Diagnosis not present

## 2022-09-28 DIAGNOSIS — N179 Acute kidney failure, unspecified: Secondary | ICD-10-CM | POA: Diagnosis not present

## 2022-09-28 DIAGNOSIS — D649 Anemia, unspecified: Secondary | ICD-10-CM | POA: Diagnosis not present

## 2022-09-28 DIAGNOSIS — Z8249 Family history of ischemic heart disease and other diseases of the circulatory system: Secondary | ICD-10-CM | POA: Diagnosis not present

## 2022-09-28 DIAGNOSIS — K2289 Other specified disease of esophagus: Secondary | ICD-10-CM | POA: Diagnosis not present

## 2022-09-28 DIAGNOSIS — G4730 Sleep apnea, unspecified: Secondary | ICD-10-CM | POA: Diagnosis not present

## 2022-09-28 DIAGNOSIS — E7800 Pure hypercholesterolemia, unspecified: Secondary | ICD-10-CM | POA: Diagnosis not present

## 2022-09-28 DIAGNOSIS — Z8673 Personal history of transient ischemic attack (TIA), and cerebral infarction without residual deficits: Secondary | ICD-10-CM | POA: Diagnosis not present

## 2022-09-28 DIAGNOSIS — Z7901 Long term (current) use of anticoagulants: Secondary | ICD-10-CM | POA: Diagnosis not present

## 2022-09-28 DIAGNOSIS — N1832 Chronic kidney disease, stage 3b: Secondary | ICD-10-CM | POA: Diagnosis not present

## 2022-09-28 DIAGNOSIS — D62 Acute posthemorrhagic anemia: Secondary | ICD-10-CM | POA: Diagnosis not present

## 2022-09-28 DIAGNOSIS — Z882 Allergy status to sulfonamides status: Secondary | ICD-10-CM | POA: Diagnosis not present

## 2022-09-28 DIAGNOSIS — I2510 Atherosclerotic heart disease of native coronary artery without angina pectoris: Secondary | ICD-10-CM | POA: Diagnosis present

## 2022-09-28 DIAGNOSIS — C160 Malignant neoplasm of cardia: Secondary | ICD-10-CM | POA: Diagnosis not present

## 2022-09-28 DIAGNOSIS — I482 Chronic atrial fibrillation, unspecified: Secondary | ICD-10-CM | POA: Diagnosis not present

## 2022-09-28 DIAGNOSIS — K229 Disease of esophagus, unspecified: Secondary | ICD-10-CM | POA: Diagnosis not present

## 2022-09-28 DIAGNOSIS — Z79899 Other long term (current) drug therapy: Secondary | ICD-10-CM | POA: Diagnosis not present

## 2022-09-28 DIAGNOSIS — F32A Depression, unspecified: Secondary | ICD-10-CM | POA: Diagnosis not present

## 2022-09-28 HISTORY — PX: ESOPHAGOGASTRODUODENOSCOPY (EGD) WITH PROPOFOL: SHX5813

## 2022-09-28 HISTORY — PX: HEMOSTASIS CONTROL: SHX6838

## 2022-09-28 LAB — GLUCOSE, CAPILLARY
Glucose-Capillary: 107 mg/dL — ABNORMAL HIGH (ref 70–99)
Glucose-Capillary: 100 mg/dL — ABNORMAL HIGH (ref 70–99)
Glucose-Capillary: 174 mg/dL — ABNORMAL HIGH (ref 70–99)
Glucose-Capillary: 144 mg/dL — ABNORMAL HIGH (ref 70–99)

## 2022-09-28 LAB — CBC
HCT: 23.2 % — ABNORMAL LOW (ref 39.0–52.0)
Hemoglobin: 7.1 g/dL — ABNORMAL LOW (ref 13.0–17.0)
MCH: 25.4 pg — ABNORMAL LOW (ref 26.0–34.0)
MCHC: 30.6 g/dL (ref 30.0–36.0)
MCV: 82.9 fL (ref 80.0–100.0)
Platelets: 231 10*3/uL (ref 150–400)
RBC: 2.8 MIL/uL — ABNORMAL LOW (ref 4.22–5.81)
RDW: 17.5 % — ABNORMAL HIGH (ref 11.5–15.5)
WBC: 6.9 10*3/uL (ref 4.0–10.5)
nRBC: 0 % (ref 0.0–0.2)

## 2022-09-28 LAB — BASIC METABOLIC PANEL
Anion gap: 9 (ref 5–15)
BUN: 40 mg/dL — ABNORMAL HIGH (ref 6–20)
CO2: 24 mmol/L (ref 22–32)
Calcium: 8.4 mg/dL — ABNORMAL LOW (ref 8.9–10.3)
Chloride: 104 mmol/L (ref 98–111)
Creatinine, Ser: 1.73 mg/dL — ABNORMAL HIGH (ref 0.61–1.24)
GFR, Estimated: 47 mL/min — ABNORMAL LOW (ref >60)
Glucose, Bld: 109 mg/dL — ABNORMAL HIGH (ref 70–99)
Potassium: 3.7 mmol/L (ref 3.5–5.1)
Sodium: 137 mmol/L (ref 135–145)

## 2022-09-28 LAB — TYPE AND SCREEN: Unit division: 0

## 2022-09-28 LAB — BPAM RBC: ISSUE DATE / TIME: 202406290759

## 2022-09-28 LAB — HEMOGLOBIN AND HEMATOCRIT, BLOOD
HCT: 25.1 % — ABNORMAL LOW (ref 39.0–52.0)
Hemoglobin: 7.9 g/dL — ABNORMAL LOW (ref 13.0–17.0)

## 2022-09-28 LAB — CEA: CEA: 14.4 ng/mL — ABNORMAL HIGH (ref 0.0–4.7)

## 2022-09-28 LAB — PROTIME-INR
INR: 1.4 — ABNORMAL HIGH (ref 0.8–1.2)
Prothrombin Time: 17.2 seconds — ABNORMAL HIGH (ref 11.4–15.2)

## 2022-09-28 LAB — PREPARE RBC (CROSSMATCH)

## 2022-09-28 SURGERY — ESOPHAGOGASTRODUODENOSCOPY (EGD) WITH PROPOFOL
Anesthesia: General

## 2022-09-28 MED ORDER — PROPOFOL 1000 MG/100ML IV EMUL
INTRAVENOUS | Status: AC
  Filled 2022-09-28: qty 100

## 2022-09-28 MED ORDER — SODIUM CHLORIDE 0.9% IV SOLUTION: Freq: Once | INTRAVENOUS | Status: AC

## 2022-09-28 MED ORDER — PROPOFOL 10 MG/ML IV BOLUS
INTRAVENOUS | Status: DC | PRN
  Administered 2022-09-28: 60 mg via INTRAVENOUS

## 2022-09-28 MED ORDER — LIDOCAINE HCL (CARDIAC) PF 100 MG/5ML IV SOSY
PREFILLED_SYRINGE | INTRAVENOUS | Status: DC | PRN
  Administered 2022-09-28: 100 mg via INTRAVENOUS

## 2022-09-28 MED ORDER — PROPOFOL 500 MG/50ML IV EMUL
INTRAVENOUS | Status: DC | PRN
  Administered 2022-09-28: 100 ug/kg/min via INTRAVENOUS

## 2022-09-28 MED ORDER — FUROSEMIDE 10 MG/ML IJ SOLN
20.0000 mg | Freq: Once | INTRAMUSCULAR | Status: AC
  Administered 2022-09-28: 20 mg via INTRAVENOUS
  Filled 2022-09-28: qty 2

## 2022-09-28 NOTE — Transfer of Care (Signed)
Immediate Anesthesia Transfer of Care Note  Patient: Jeremiah Vance  Procedure(s) Performed: ESOPHAGOGASTRODUODENOSCOPY (EGD) WITH PROPOFOL  Patient Location: PACU  Anesthesia Type:General  Level of Consciousness: drowsy  Airway & Oxygen Therapy: Patient Spontanous Breathing  Post-op Assessment: Report given to RN and Post -op Vital signs reviewed and stable  Post vital signs: Reviewed and stable  Last Vitals:  Vitals Value Taken Time  BP 110/65   Temp    Pulse 89   Resp 25   SpO2 98     Last Pain:  Vitals:   09/28/22 0959  TempSrc:   PainSc: 0-No pain         Complications: No notable events documented.

## 2022-09-28 NOTE — Anesthesia Preprocedure Evaluation (Signed)
Anesthesia Evaluation  Patient identified by MRN, date of birth, ID band Patient awake  General Assessment Comment:  Denies nausea or v omiting  Reviewed: Allergy & Precautions, NPO status , Patient's Chart, lab work & pertinent test results  History of Anesthesia Complications Negative for: history of anesthetic complications  Airway Mallampati: II  TM Distance: >3 FB Neck ROM: Full    Dental  (+) Poor Dentition, Chipped   Pulmonary sleep apnea , neg COPD, Patient abstained from smoking.Not current smoker   Pulmonary exam normal breath sounds clear to auscultation       Cardiovascular Exercise Tolerance: Good METShypertension, Pt. on medications +CHF  (-) CAD and (-) Past MI (-) dysrhythmias  Rhythm:Regular Rate:Normal - Systolic murmurs 1. Left ventricular ejection fraction, by estimation, is 30 to 35%. The  left ventricle has moderately decreased function. The left ventricle  demonstrates global hypokinesis. There is severe left ventricular  hypertrophy. Left ventricular diastolic  parameters are consistent with Grade I diastolic dysfunction (impaired  relaxation).   2. Right ventricular systolic function is mildly reduced. The right  ventricular size is mildly enlarged. There is normal pulmonary artery  systolic pressure. The estimated right ventricular systolic pressure is  22.7 mmHg.   3. Left atrial size was moderately dilated.   4. The mitral valve is normal in structure. No evidence of mitral valve  regurgitation. No evidence of mitral stenosis.   5. The aortic valve is normal in structure. Aortic valve regurgitation is  not visualized. Aortic valve sclerosis is present, with no evidence of  aortic valve stenosis.   6. There is mild dilatation of the aortic root, measuring 42 mm.   7. The inferior vena cava is normal in size with greater than 50%  respiratory variability, suggesting right atrial pressure of 3 mmHg.    8. Challenging images     Neuro/Psych  PSYCHIATRIC DISORDERS Anxiety Depression    Residual left arm and leg weakness CVA, Residual Symptoms    GI/Hepatic ,neg GERD  ,,(+)     (-) substance abuse    Endo/Other  diabetes    Renal/GU CRFRenal disease     Musculoskeletal   Abdominal   Peds  Hematology   Anesthesia Other Findings Past Medical History: 09/06/2017: Acute ischemic stroke (HCC) 07/08/2019: Acute renal failure superimposed on stage 3a chronic kidney  disease (HCC) No date: Acute right-sided weakness No date: Allergies No date: Anasarca 07/10/2017: Anemia No date: Arthritis No date: CHF (congestive heart failure) (HCC)     Comment:  "coinsided w/kidney problems I was having 06/2017" No date: Chicken pox 06/2017: CKD (chronic kidney disease) stage 3, GFR 30-59 ml/min (HCC) No date: Depression 07/08/2019: Elevated troponin No date: High cholesterol No date: History of cardiomyopathy     Comment:  LVEF 40 to 45% in April 2019 - subsequently normalized 07/10/2017: Hyperbilirubinemia No date: Hypertension No date: Ischemic stroke (HCC)     Comment:  Small left internal capsule infarct due to lacunar               disease No date: Morbid obesity (HCC) 12/16/2017: Normocytic anemia 10/21/2017: Recurrent incisional hernia with incarceration s/p repair  10/22/2017 08/2017: Stroke [facility])     Comment:  "right sided weakness since; getting stronger though"               (10/22/2017) No date: Type 2 diabetes mellitus (HCC)  Reproductive/Obstetrics  Anesthesia Physical Anesthesia Plan  ASA: 3  Anesthesia Plan: General   Post-op Pain Management: Minimal or no pain anticipated   Induction: Intravenous  PONV Risk Score and Plan: 2 and Propofol infusion, TIVA and Ondansetron  Airway Management Planned: Nasal Cannula  Additional Equipment: None  Intra-op Plan:   Post-operative Plan:   Informed Consent: I have  reviewed the patients History and Physical, chart, labs and discussed the procedure including the risks, benefits and alternatives for the proposed anesthesia with the patient or authorized representative who has indicated his/her understanding and acceptance.     Dental advisory given  Plan Discussed with: CRNA and Surgeon  Anesthesia Plan Comments: (Discussed risks of anesthesia with patient, including possibility of difficulty with spontaneous ventilation under anesthesia necessitating airway intervention, PONV, and rare risks such as cardiac or respiratory or neurological events, and allergic reactions. Discussed the role of CRNA in patient's perioperative care. Patient understands.)       Anesthesia Quick Evaluation

## 2022-09-28 NOTE — Progress Notes (Signed)
Patient transferred to endo with RN via bed in stable condition. Patient has been receiving  blood.patient reports given.

## 2022-09-28 NOTE — Progress Notes (Signed)
PROGRESS NOTE    Jeremiah Vance   NWG:[id number] DOB: 1970/07/16  DOA: 09/27/2022 Date of Service: 09/28/22 PCP: Debera Lat, PA-C     Brief Narrative / Hospital Course:  Jeremiah Vance is a 52 y.o. male with medical history significant of GE junction cancer- stage IV, HTN, HLD, CHF with EF 30-35%, stroke, gout, depression with anxiety, CKD-3B, obesity, A-fib on Eliquis, who presents from home to ED 09/27/22 with weakness x2 days. Sent to ED from cancer center when hgb noted to be 7.3 (was starting chemo). Eliwuis has been on hold for port-cath placement  06/28: Hgb 7.2 (vs 9.3 on 06/20). GI consulted.  06/29: EGD done. (+)oozing from gastric mass, will continue to watch HH and advance diet slowly.   Consultants:  Laurette Schimke  Procedures: EGD 09/28/22       ASSESSMENT & PLAN:   Principal Problem:   Symptomatic anemia Active Problems:   Iron deficiency anemia   GE junction carcinoma (HCC)   Atrial fibrillation, chronic (HCC)   Myocardial injury   Hyperlipidemia   Stroke (HCC)   HTN (hypertension)   Chronic combined systolic and diastolic congestive heart failure (HCC)   Chronic kidney disease, stage 3b (HCC)   Anxiety and depression   Type II diabetes mellitus with renal manifestations (HCC)   Morbid obesity (HCC)   Symptomatic anemia w/ concern for ABLA d/t GI bleed, complicated by chronic Iron deficiency anemia:  Hgb 9.3 on 09/19/22 --> 7.2 on admission.   due to GE junction carcinoma related bleeding.  S/p 2 units PRBC S/p EGD  Hold Eliquis IVF: will not give IV due to EF 30-35% IV pantoprazole 40 mg bid Zofran IV for nausea Avoid NSAIDs and SQ heparin Maintain IV access (2 large bore IVs if possible). Venofer daily x 2 dose per onc recs    GE junction carcinoma (HCC): stage IV.  Pt was supposed to start first chemotherapy 06/28, which is canceled due to worsening anemia. Follow-up with Dr. Cathie Hoops of oncology   Myocardial injury:  Troponin level 41, no  chest pain Will not give aspirin due to possible GI bleeding Check A1c, FLP Lipitor   Chronic A fib:  HR 112--> 90s hold Eliquis tele monitoring   Hyperlipidemia Lipitor   History of Stroke (HCC) hold Eliquis Conitnue lipitor   Essential HTN (hypertension): Blood pressure 110/70 Hold irbesartan and oral hydralazine since patient is at risk of developing hypotension due to possible GI bleeding   Chronic combined systolic and diastolic congestive heart failure (HCC): 2D echo 11/23/2021 showed EF of 30 to 35% with grade 1 diastolic dysfunction.  Patient has trace leg edema, does not seem to have CHF exacerbation.  BNP is mildly elevated to 71. Gave 40 mg of IV Lasix yesterday since patient was to receive 1 unit of blood, repeat lasix w/ blood transfusion Torsemide 40 mg daily    Chronic kidney disease, stage 3b [facility]): Renal function slightly worsening baseline.  Recent baseline creatinine 1.4-1.8.  His creatinine is 1.90, BUN 42, GFR 42 Avoid using renal toxic medications   Anxiety and depression As needed Ativan BuSpar   Diabetes mellitus with renal complication: Recent A1c 6.1, well-controlled.  Patient is taking Farxiga Sliding scale insulin    Morbid obesity (HCC): Body weight 107.7 kg, BMI 34.06 Encourage losing weight healthy diet     DVT prophylaxis: SCD Pertinent IV fluids/nutrition: no IV Fluids, slowly advance diet full liquids ok to start 4h following EGD  Central lines / invasive devices:  none  Code Status: FULL CODE ACP documentation reviewed: none on file   Current Admission Status: inpatient   TOC needs / Dispo plan: home Barriers to discharge / significant pending items: watching HH and advancing diet anticiapte d/c tomorrow              Subjective / Brief ROS:  Patient reports doing well after procedure, no concerns Denies CP/SOB.  Pain controlled.  Denies new weakness.  Hasn't eaten yet.  Reports no concerns w/ urination/defecation.    Family Communication: none at this time    Objective Findings:  Vitals:   09/28/22 1042 09/28/22 1052 09/28/22 1102 09/28/22 1141  BP: 112/72 106/79 116/75 116/77  Pulse: 98 91 87 85  Resp: 18 19 12 16   Temp:    98 F (36.7 C)  TempSrc:      SpO2: 94% 96% 95% 94%  Weight:      Height:        Intake/Output Summary (Last 24 hours) at 09/28/2022 1440 Last data filed at 09/28/2022 1215 Gross per 24 hour  Intake 576.61 ml  Output --  Net 576.61 ml   Filed Weights   09/27/22 1500 09/28/22 0216  Weight: 107.7 kg 110.7 kg    Examination:  Physical Exam Constitutional:      General: He is not in acute distress.    Appearance: Normal appearance. He is obese.  Cardiovascular:     Rate and Rhythm: Normal rate and regular rhythm.  Pulmonary:     Effort: Pulmonary effort is normal.     Breath sounds: Normal breath sounds.  Abdominal:     Palpations: Abdomen is soft.  Neurological:     General: No focal deficit present.     Mental Status: He is alert and oriented to person, place, and time. Mental status is at baseline.  Psychiatric:        Mood and Affect: Mood normal.        Behavior: Behavior normal.          Scheduled Medications:   atorvastatin  40 mg Oral q1800   busPIRone  7.5 mg Oral BID   feeding supplement  1 Container Oral TID BM   insulin aspart  0-5 Units Subcutaneous QHS   insulin aspart  0-9 Units Subcutaneous TID WC   LORazepam  0.5 mg Intravenous Once   nystatin   Topical BID   pantoprazole (PROTONIX) IV  40 mg Intravenous Q12H   torsemide  40 mg Oral Daily    Continuous Infusions:  iron sucrose Stopped (09/27/22 1649)    PRN Medications:  acetaminophen, hydrALAZINE, LORazepam, ondansetron (ZOFRAN) IV, oxyCODONE  Antimicrobials from admission:  Anti-infectives (From admission, onward)    None           Data Reviewed:  I have personally reviewed the following...  CBC: Recent Labs  Lab 09/27/22 0849 09/27/22 1110  09/27/22 1650 09/28/22 0500 09/28/22 1350  WBC 6.9 6.9 7.1 6.9  --   NEUTROABS 5.0  --   --   --   --   HGB 7.3* 7.2* 7.8* 7.1* 7.9*  HCT 23.9* 24.5* 25.7* 23.2* 25.1*  MCV 81.8 84.2 82.1 82.9  --   PLT 318 299 250 231  --    Basic Metabolic Panel: Recent Labs  Lab 09/27/22 0849 09/27/22 1110 09/28/22 0500  NA 133* 137 137  K 4.4 5.0 3.7  CL 100 103 104  CO2 23 23 24   GLUCOSE 228* 194* 109*  BUN  41* 42* 40*  CREATININE 1.78* 1.90* 1.73*  CALCIUM 8.5* 8.9 8.4*  MG  --  2.4  --    GFR: Estimated Creatinine Clearance: 62.2 mL/min (A) (by C-G formula based on SCr of 1.73 mg/dL (H)). Liver Function Tests: Recent Labs  Lab 09/27/22 0849  AST 37  ALT 11  ALKPHOS 64  BILITOT 0.6  PROT 7.1  ALBUMIN 2.9*   No results for input(s): "LIPASE", "AMYLASE" in the last 168 hours. No results for input(s): "AMMONIA" in the last 168 hours. Coagulation Profile: Recent Labs  Lab 09/27/22 1650 09/28/22 0500  INR 1.4* 1.4*   Cardiac Enzymes: No results for input(s): "CKTOTAL", "CKMB", "CKMBINDEX", "TROPONINI" in the last 168 hours. BNP (last 3 results) Recent Labs    11/28/21 1520  PROBNP 966*   HbA1C: No results for input(s): "HGBA1C" in the last 72 hours. CBG: Recent Labs  Lab 09/25/22 1023 09/27/22 1640 09/27/22 2052 09/28/22 0741 09/28/22 1139  GLUCAP 141* 104* 111* 107* 100*   Lipid Profile: Recent Labs    09/27/22 1200  CHOL 167  HDL 24*  LDLCALC 115*  TRIG 139  CHOLHDL 7.0   Thyroid Function Tests: No results for input(s): "TSH", "T4TOTAL", "FREET4", "T3FREE", "THYROIDAB" in the last 72 hours. Anemia Panel: No results for input(s): "VITAMINB12", "FOLATE", "FERRITIN", "TIBC", "IRON", "RETICCTPCT" in the last 72 hours. Most Recent Urinalysis On File:     Component Value Date/Time   COLORURINE STRAW (A) 09/27/2022 1100   APPEARANCEUR CLEAR (A) 09/27/2022 1100   LABSPEC 1.006 09/27/2022 1100   PHURINE 6.0 09/27/2022 1100   GLUCOSEU NEGATIVE  09/27/2022 1100   HGBUR NEGATIVE 09/27/2022 1100   BILIRUBINUR NEGATIVE 09/27/2022 1100   BILIRUBINUR negative 05/19/2020 1618   KETONESUR NEGATIVE 09/27/2022 1100   PROTEINUR NEGATIVE 09/27/2022 1100   UROBILINOGEN 0.2 05/19/2020 1618   UROBILINOGEN 2.0 (H) 10/21/2017 1102   NITRITE NEGATIVE 09/27/2022 1100   LEUKOCYTESUR TRACE (A) 09/27/2022 1100   Sepsis Labs: @LABRCNTIP (procalcitonin:4,lacticidven:4) Microbiology: No results found for this or any previous visit (from the past 240 hour(s)).    Radiology Studies last 3 days: PERIPHERAL VASCULAR CATHETERIZATION  Result Date: 09/25/2022 See surgical note for result.            LOS: 0 days       Sunnie Nielsen, DO [facility] 09/28/2022, 2:40 PM    Dictation software may have been used to generate the above note. Typos may occur and escape review in typed/dictated notes. Please contact Dr Lyn Hollingshead directly for clarity if needed.  Staff may message me via secure chat in Epic  but this may not receive an immediate response,  please page me for urgent matters!  If 7PM-7AM, please contact night coverage [email or web address]

## 2022-09-28 NOTE — Progress Notes (Signed)
Patient received from endo via bed in stable condition.Blood transfusion completed in endo.

## 2022-09-28 NOTE — Care Plan (Signed)
Brief GI update note  EGD completed with known esophageal mass extending into the cardia.  The superior portion of this was oozing.  After completing the EGD with pediatric endoscope this was exchanged for an adult gastroscope and bleeding area was treated with Hemospray. Unfortunately, this is the limitation of what is possible for treating this bleeding.  I suspect that this will continue to be an issue.  Transfusion as needed.  Monitor H&H.  Patient also appears to have candidal esophagitis as well.  Recommend IV fluconazole 400 mg today and 200 mg for the next 13 days thereafter for treatment as it will likely be difficult to swallow large fluconazole pills unless these can be crushed.  Spoke with the patient's sister on the phone.  Updated patient and the hospitalist as well.  GI to sign off. Available as needed. Please do not hesitate to call regarding questions or concerns.  Enis Slipper, DO [facility] Gastro[facility]

## 2022-09-28 NOTE — Progress Notes (Addendum)
          Patient is alert and oriented X4. Patient has been receiving blood. Patient tolerate well. No any other symptoms noted.

## 2022-09-28 NOTE — Op Note (Signed)
[facility] Gastroenterology Patient Name: Jeremiah Vance Procedure Date: 09/28/2022 9:44 AM MRN: [id number] Account #: [id number] Date of Birth: 1970/12/26 Admit Type: Inpatient Age: 52 Room: [facility] ENDO ROOM 4 Gender: Male Note Status: Finalized Instrument Name: Peds Upper Endoscope [id number] Endoscope [id number] Procedure:             Upper GI endoscopy Indications:           Iron deficiency anemia secondary to chronic blood loss Providers:             Jaynie Collins DO, DO Referring MD:          Debera Lat (Referring MD) Medicines:             Monitored Anesthesia Care Complications:         No immediate complications. Estimated blood loss:                         Minimal. Procedure:             Pre-Anesthesia Assessment:                        - Prior to the procedure, a History and Physical was                         performed, and patient medications and allergies were                         reviewed. The patient is competent. The risks and                         benefits of the procedure and the sedation options and                         risks were discussed with the patient. All questions                         were answered and informed consent was obtained.                         Patient identification and proposed procedure were                         verified by the physician, the nurse, the anesthetist                         and the technician in the endoscopy suite. Mental                         Status Examination: alert and oriented. Airway                         Examination: normal oropharyngeal airway and neck                         mobility. Respiratory Examination: clear to                         auscultation. CV Examination: RRR, no murmurs, no S3  or S4. Prophylactic Antibiotics: The patient does not                         require prophylactic antibiotics. Prior                          Anticoagulants: The patient has taken no anticoagulant                         or antiplatelet agents. ASA Grade Assessment: III - A                         patient with severe systemic disease. After reviewing                         the risks and benefits, the patient was deemed in                         satisfactory condition to undergo the procedure. The                         anesthesia plan was to use monitored anesthesia care                         (MAC). Immediately prior to administration of                         medications, the patient was re-assessed for adequacy                         to receive sedatives. The heart rate, respiratory                         rate, oxygen saturations, blood pressure, adequacy of                         pulmonary ventilation, and response to care were                         monitored throughout the procedure. The physical                         status of the patient was re-assessed after the                         procedure.                        After obtaining informed consent, the endoscope was                         passed under direct vision. Throughout the procedure,                         the patient's blood pressure, pulse, and oxygen                         saturations were monitored continuously. The Endoscope  was introduced through the mouth, and advanced to the                         second part of duodenum. The Endoscope was introduced                         through the mouth, and advanced to the lower third of                         esophagus. The upper GI endoscopy was accomplished                         without difficulty. The patient tolerated the                         procedure well. Findings:      The duodenal bulb, first portion of the duodenum and second portion of       the duodenum were normal. Estimated blood loss: none.      A large, fungating mass with no bleeding and no stigmata  of recent       bleeding was found in the cardia. Mass extending into gastric cardia.       blood from superior part of mass in esophagus going into stomach. no       other signs of bleeding. Estimated blood loss: none.      A large, fungating mass with bleeding and stigmata of recent bleeding       was found in the lower third of the esophagus, 35 cm from the incisors.       The mass was partially obstructing and partially circumferential       (involving two thirds of the lumen circumference). Mass was oozing upon       entry and friable on contact with passage of pediatric endoscope. After       completing EGD exam, this was exchanged for adult gastroscope. Scope was       advanced to superior portion of mass and hemaspray deployed to attempt       to control oozing. To stop active bleeding, hemostatic spray was       deployed. Multiple sprays were applied. There was no bleeding at the end       of the procedure.      Diffuse, white plaques were found in the upper third of the esophagus       and in the middle third of the esophagus. Estimated blood loss: none. Impression:            - Normal duodenal bulb, first portion of the duodenum                         and second portion of the duodenum.                        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  Hemostatic spray applied.                        - Esophageal plaques were found, consistent with                         candidiasis.                        - No specimens collected. Recommendation:        - Patient has a contact number available for                         emergencies. The signs and symptoms of potential                         delayed complications were discussed with the patient.                         Return to normal activities tomorrow.  Written                         discharge instructions were provided to the patient.                        - Return patient to hospital ward for ongoing care.                        - NPO for 4 hours.                        - Advance diet as tolerated after 4 hours to full                         liquid diet.                        - Continue present medications.                        - Start fluconazole for candidiasis. Recommend IV                         given esophageal mass would likely make it difficult                         to swallow large pills. 400 mg today; 200 mg for 13                         days starting tomorrow.                        - No further endoscopic intervention is available.                         Suspect patient will continue to have long term oozing                         with mass.  May benefit from radiation- defer to his oncologist.                        Otherwise, would transfuse as necessary.                        - PEG tube placement via endoscopy would not be                         possible given obstructive nature of mass. Should                         feeding tube be needed in the future- recommend other                         modality- surgery/IR                        - The findings and recommendations were discussed with                         the patient's family.                        - The findings and recommendations were discussed with                         the patient.                        - The findings and recommendations were discussed with                         the referring physician.                        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 All rights reserved. The codes documented in this report are preliminary and upon coder review may  be revised to meet current compliance requirements. Attending Participation:      I personally performed the entire procedure. Elfredia Nevins, DO Jaynie Collins DO, DO 09/28/2022 10:35:57 AM This report has been signed electronically. Number of Addenda: 0 Note Initiated On: 09/28/2022 9:44 AM Estimated Blood Loss:  Estimated blood loss was minimal.      [facility]

## 2022-09-28 NOTE — Anesthesia Postprocedure Evaluation (Signed)
Anesthesia Post Note  Patient: Rithik D Calvert  Procedure(s) Performed: ESOPHAGOGASTRODUODENOSCOPY (EGD) WITH PROPOFOL HEMOSTASIS CONTROL  Patient location during evaluation: Endoscopy Anesthesia Type: General Level of consciousness: awake and alert Pain management: pain level controlled Vital Signs Assessment: post-procedure vital signs reviewed and stable Respiratory status: spontaneous breathing, nonlabored ventilation, respiratory function stable and patient connected to nasal cannula oxygen Cardiovascular status: blood pressure returned to baseline and stable Postop Assessment: no apparent nausea or vomiting Anesthetic complications: no   No notable events documented.   Last Vitals:  Vitals:   09/28/22 1141 09/28/22 1607  BP: 116/77 126/70  Pulse: 85   Resp: 16 18  Temp: 36.7 C   SpO2: 94% 100%    Last Pain:  Vitals:   09/28/22 1102  TempSrc:   PainSc: 0-No pain                 Corinda Gubler

## 2022-09-28 NOTE — Hospital Course (Addendum)
Jeremiah Vance is a 52 y.o. male with medical history significant of GE junction cancer- stage IV, HTN, HLD, CHF with EF 30-35%, stroke, gout, depression with anxiety, CKD-3B, obesity, A-fib on Eliquis, who presents from home to ED 09/27/22 with weakness x2 days. Sent to ED from cancer center when hgb noted to be 7.3 (was starting chemo). Eliwuis has been on hold for port-cath placement  06/28: Hgb 7.2 (vs 9.3 on 06/20). GI consulted.  06/29: EGD done. (+)oozing from gastric mass, will continue to watch HH and advance diet slowly.   Consultants:  Laurette Schimke  Procedures: EGD 09/28/22       ASSESSMENT & PLAN:   Principal Problem:   Symptomatic anemia Active Problems:   Iron deficiency anemia   GE junction carcinoma (HCC)   Atrial fibrillation, chronic (HCC)   Myocardial injury   Hyperlipidemia   Stroke (HCC)   HTN (hypertension)   Chronic combined systolic and diastolic congestive heart failure (HCC)   Chronic kidney disease, stage 3b (HCC)   Anxiety and depression   Type II diabetes mellitus with renal manifestations (HCC)   Morbid obesity (HCC)   Symptomatic anemia w/ concern for ABLA d/t GI bleed, complicated by chronic Iron deficiency anemia:  Hgb 9.3 on 09/19/22 --> 7.2 on admission.   due to GE junction carcinoma related bleeding.  S/p 2 units PRBC S/p EGD  Hold Eliquis IVF: will not give IV due to EF 30-35% IV pantoprazole 40 mg bid Zofran IV for nausea Avoid NSAIDs and SQ heparin Maintain IV access (2 large bore IVs if possible). Venofer daily x 2 dose per onc recs    GE junction carcinoma (HCC): stage IV.  Pt was supposed to start first chemotherapy 06/28, which is canceled due to worsening anemia. Follow-up with Dr. Cathie Hoops of oncology   Myocardial injury:  Troponin level 41, no chest pain Will not give aspirin due to possible GI bleeding Check A1c, FLP Lipitor   Chronic A fib:  HR 112--> 90s hold Eliquis tele monitoring   Hyperlipidemia Lipitor   History  of Stroke (HCC) hold Eliquis Conitnue lipitor   Essential HTN (hypertension): Blood pressure 110/70 Hold irbesartan and oral hydralazine since patient is at risk of developing hypotension due to possible GI bleeding   Chronic combined systolic and diastolic congestive heart failure (HCC): 2D echo 11/23/2021 showed EF of 30 to 35% with grade 1 diastolic dysfunction.  Patient has trace leg edema, does not seem to have CHF exacerbation.  BNP is mildly elevated to 71. Gave 40 mg of IV Lasix yesterday since patient was to receive 1 unit of blood, repeat lasix w/ blood transfusion Torsemide 40 mg daily    Chronic kidney disease, stage 3b [facility]): Renal function slightly worsening baseline.  Recent baseline creatinine 1.4-1.8.  His creatinine is 1.90, BUN 42, GFR 42 Avoid using renal toxic medications   Anxiety and depression As needed Ativan BuSpar   Diabetes mellitus with renal complication: Recent A1c 6.1, well-controlled.  Patient is taking Farxiga Sliding scale insulin    Morbid obesity (HCC): Body weight 107.7 kg, BMI 34.06 Encourage losing weight healthy diet     DVT prophylaxis: SCD Pertinent IV fluids/nutrition: no IV Fluids, slowly advance diet full liquids ok to start 4h following EGD  Central lines / invasive devices: none  Code Status: FULL CODE ACP documentation reviewed: none on file   Current Admission Status: inpatient   TOC needs / Dispo plan: home Barriers to discharge / significant pending items: watching  HH and advancing diet anticiapte d/c tomorrow

## 2022-09-28 NOTE — Interval H&P Note (Signed)
History and Physical Interval Note: Consult note from 09/27/22 was reviewed and there was no interval change in physical exam was noted after seeing and examining the patient.  Patient actively receiving 1 unit PRBC.  His hemoglobin did not improve with 1 unit yesterday, in fact his hemoglobin distended to 7.1 from 7.8 after receiving the unit.  BUN/creatinine ratio still elevated at 40/1.73.  INR 1.4 BNP 271.  Troponin has down trended and the patient denies any chest pain or shortness of breath.  He was able to tolerate soup yesterday without issue.  N.p.o. since midnight.  No melena or hematochezia reported.  No hematemesis hemoptysis or coffee-ground emesis reported.  Written consent was obtained from the patient after discussion of risks, benefits, and alternatives. Patient has consented to proceed with Esophagogastroduodenoscopy with possible intervention   09/28/2022 9:55 AM  Jeremiah Vance  has presented today for surgery, with the diagnosis of esophageal mass, anemia.  The various methods of treatment have been discussed with the patient and family. After consideration of risks, benefits and other options for treatment, the patient has consented to  Procedure(s): ESOPHAGOGASTRODUODENOSCOPY (EGD) WITH PROPOFOL (N/A) as a surgical intervention.  The patient's history has been reviewed, patient examined, no change in status, stable for surgery.  I have reviewed the patient's chart and labs.  Questions were answered to the patient's satisfaction.     Jaynie Collins

## 2022-09-29 DIAGNOSIS — D649 Anemia, unspecified: Secondary | ICD-10-CM | POA: Diagnosis not present

## 2022-09-29 LAB — BPAM RBC
Blood Product Expiration Date: 202407232359
Blood Product Expiration Date: 202407242359
Unit Type and Rh: 6200
Unit Type and Rh: 6200

## 2022-09-29 LAB — HEMOGLOBIN AND HEMATOCRIT, BLOOD
HCT: 25.9 % — ABNORMAL LOW (ref 39.0–52.0)
Hemoglobin: 8 g/dL — ABNORMAL LOW (ref 13.0–17.0)

## 2022-09-29 LAB — TYPE AND SCREEN: ABO/RH(D): A POS

## 2022-09-29 LAB — GLUCOSE, CAPILLARY
Glucose-Capillary: 108 mg/dL — ABNORMAL HIGH (ref 70–99)
Glucose-Capillary: 191 mg/dL — ABNORMAL HIGH (ref 70–99)

## 2022-09-29 MED ORDER — TORSEMIDE 20 MG PO TABS
40.0000 mg | ORAL_TABLET | Freq: Every day | ORAL | 3 refills | Status: DC | PRN
Start: 2022-09-29 — End: 2022-12-07

## 2022-09-29 MED ORDER — PANTOPRAZOLE SODIUM 40 MG PO TBEC: 40.0000 mg | DELAYED_RELEASE_TABLET | Freq: Two times a day (BID) | ORAL | 0 refills | Status: DC

## 2022-09-29 MED ORDER — HEPARIN SOD (PORK) LOCK FLUSH 100 UNIT/ML IV SOLN
500.0000 [IU] | INTRAVENOUS | Status: AC | PRN
  Administered 2022-09-29: 500 [IU]

## 2022-09-29 MED ORDER — CHLORHEXIDINE GLUCONATE CLOTH 2 % EX PADS
6.0000 | MEDICATED_PAD | Freq: Every day | CUTANEOUS | Status: DC
  Administered 2022-09-29: 6 via TOPICAL

## 2022-09-29 MED ORDER — SCOPOLAMINE 1 MG/3DAYS TD PT72
1.0000 | MEDICATED_PATCH | TRANSDERMAL | Status: DC
  Administered 2022-09-29: 1.5 mg via TRANSDERMAL
  Filled 2022-09-29: qty 1

## 2022-09-29 MED ORDER — SCOPOLAMINE 1 MG/3DAYS TD PT72: 1.0000 | MEDICATED_PATCH | TRANSDERMAL | 0 refills | Status: DC

## 2022-09-29 NOTE — Plan of Care (Signed)
Patient is alert and oriented X4. Discharge teaching given. Problem: Education: Goal: Ability to describe self-care measures that may prevent or decrease complications (Diabetes Survival Skills Education) will improve Outcome: Completed/Met Goal: Individualized Educational Video(s) Outcome: Completed/Met   Problem: Coping: Goal: Ability to adjust to condition or change in health will improve Outcome: Completed/Met   Problem: Fluid Volume: Goal: Ability to maintain a balanced intake and output will improve Outcome: Completed/Met   Problem: Health Behavior/Discharge Planning: Goal: Ability to identify and utilize available resources and services will improve Outcome: Completed/Met Goal: Ability to manage health-related needs will improve Outcome: Completed/Met

## 2022-09-29 NOTE — Discharge Instructions (Signed)
Transportation Agency Name: [place] [facility] [employer] [facility] Address: [address] [place], [place] [address] Phone: [phone] Email: [email or web address] Website: www.[email or web address].org Service(s) Offered: Housing services, self-sufficiency, congregate meal  program, weatherization program, heating appliance  repair/replacement program, emergency food assistance,  housing counseling, home ownership program, wheels-towork program. July 25, 2016 22  Agency Name: [place] [employer] ([employer]) Address: [address], [place], [place] [address] Phone: [phone] Email:  Website: www.acta-[place].com Service(s) Offered: Transportation for general public, subscription and demand  response; Dial-a-Ride for citizens 60 years of age or older. Agency Name: Department of Social Services Address: [address], [place], [place] [address] Phone: [phone] Service(s) Offered: Child support services; child welfare services; food stamps;  Medicaid; work first family assistance; and aid with fuel,  rent, food and medicine, transportation assistance.  Agency Name: [employer] ([employer]  Network Address: Phone: [phone] Service(s) Offered: Transports veterans to the [facility]. Call  forty-eight hours in advance and leave the name, telephone  number, date, and time of appointment. Veteran will be  contacted by the driver the day before the appointment to  arrange a pick up point   

## 2022-09-29 NOTE — TOC Initial Note (Signed)
Transition of Care [facility]) - Initial/Assessment Note    Patient Details  Name: Jeremiah Vance MRN: [id number] Date of Birth: [birth year]  Transition of Care [facility]) CM/SW Contact:    Liliana Cline, LCSW Phone Number: 09/29/2022, 10:05 AM  Clinical Narrative:                 Patient has a high readmission risk score and SDOH flagged for transportation resources. Spoke to patient. Patient lives home with his sister. She provides transportation but does work during the day. Patient says he typically uses [place]. He is aware of Medicaid transportation option. Other transportation resources added to AVS. PCP is [facility]. Pharmacy is [facility] on [address].  Patient has a RW at home and currently goes to [employer] at [facility].  Patient denies additional needs at this time.   Expected Discharge Plan: Home/Self Care Barriers to Discharge: Continued Medical Work up   Patient Goals and CMS Choice Patient states their goals for this hospitalization and ongoing recovery are:: home with sister CMS Medicare.gov Compare Post Acute Care list provided to:: Patient Choice offered to / list presented to : Patient      Expected Discharge Plan and Services       Living arrangements for the past 2 months: Single Family Home                                      Prior Living Arrangements/Services Living arrangements for the past 2 months: Single Family Home Lives with:: Siblings Patient language and need for interpreter reviewed:: Yes Do you feel safe going back to the place where you live?: Yes      Need for Family Participation in Patient Care: Yes (Comment) Care giver support system in place?: Yes (comment) Current home services: DME Criminal Activity/Legal Involvement Pertinent to Current Situation/Hospitalization: No - Comment as needed  Activities of Daily Living Home Assistive Devices/Equipment: None ADL Screening (condition at time of admission) Patient's  cognitive ability adequate to safely complete daily activities?: Yes Is the patient deaf or have difficulty hearing?: No Does the patient have difficulty seeing, even when wearing glasses/contacts?: Yes Does the patient have difficulty concentrating, remembering, or making decisions?: No Patient able to express need for assistance with ADLs?: No Does the patient have difficulty dressing or bathing?: No Independently performs ADLs?: Yes (appropriate for developmental age) Does the patient have difficulty walking or climbing stairs?: No Weakness of Legs: Left Weakness of Arms/Hands: Left  Permission Sought/Granted Permission sought to share information with : Facility Industrial/product designer granted to share information with : Yes, Verbal Permission Granted     Permission granted to share info w AGENCY: as needed        Emotional Assessment       Orientation: : Oriented to Situation, Oriented to Self, Oriented to Place, Oriented to  Time Alcohol / Substance Use: Not Applicable Psych Involvement: No (comment)  Admission diagnosis:  Upper GI bleed [K92.2] Esophageal mass [K22.89] Symptomatic anemia [D64.9] GI bleed [K92.2] Patient Active Problem List   Diagnosis Date Noted   GI bleed 09/28/2022   Symptomatic anemia 09/27/2022   HTN (hypertension) 09/27/2022   Chronic combined systolic and diastolic congestive heart failure (HCC) 09/27/2022   Chronic kidney disease, stage 3b (HCC) 09/27/2022   Myocardial injury 09/27/2022   Iron deficiency anemia 09/27/2022   Type II diabetes mellitus with renal manifestations (HCC)  09/27/2022   Atrial fibrillation, chronic (HCC) 09/27/2022   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   Goals of care, counseling/discussion 09/19/2022   Weight loss 09/19/2022   Nausea without vomiting 09/19/2022   Anxiety associated with cancer diagnosis (HCC) 09/19/2022   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   IDA (iron deficiency anemia) 09/09/2022    Gastric mass 09/09/2022   Gastrointestinal hemorrhage 09/09/2022   Paroxysmal atrial fibrillation (HCC) 09/08/2022   Abdominal pain 09/08/2022   Anxiety and depression 07/26/2022   Dysarthria 05/20/2022   Need for diphtheria-tetanus-pertussis (Tdap) vaccine 03/20/2022   Type 2 diabetes mellitus without complication, with long-term current use of insulin (HCC) 01/16/2022   Dyslipidemia 01/16/2022   Chronic diastolic CHF (congestive heart failure) (HCC) 01/16/2022   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    Intracranial hemorrhage (HCC)    HFrEF (heart failure with reduced ejection fraction) (HCC)    Obesity, Class III, BMI 40-49.9 (morbid obesity) (HCC) 05/22/2020   Other cirrhosis of liver (HCC)    Ventral hernia without obstruction or gangrene    Shortness of breath    Neck pain    Prostate cancer screening 01/21/2020   Lymphedema of both lower extremities 10/17/2019   History of stroke 10/17/2019   CKD (chronic kidney disease) stage 3, GFR 30-59 ml/min (HCC) 08/31/2019   Swelling of limb 08/31/2019   DM (diabetes mellitus), type 2 with complications (HCC) 07/08/2019   Vitreous floaters of both eyes 06/03/2018   Moderate obstructive sleep apnea 02/11/2018   Chronic fatigue 12/16/2017   Essential hypertension 12/16/2017   History of small bowel obstruction 10/24/2017   Adjustment disorder with depressed mood 09/30/2017   Stroke (HCC) 09/14/2017   Hyperlipidemia    Gout    Elevated LFTs    Heart failure with preserved ejection fraction (HCC)    Morbid obesity (HCC)    Hypoalbuminemia 07/11/2017   PCP:  Debera Lat, PA-C Pharmacy:   [email or web address] - [place], [place] - [address] [place] [address] Phone: [phone] Fax: [phone]  [id number] [place], [place] - [address] AT [facility] [address]  [place] [address] Phone: [phone] Fax: [phone]     Social Determinants of Health (SDOH) Social History: SDOH Screenings   Food Insecurity: No Food Insecurity (09/27/2022)  Housing: Patient Declined (09/27/2022)  Transportation Needs: Unmet Transportation Needs (09/27/2022)  Utilities: Not At Risk (09/27/2022)  Alcohol Screen: Low Risk  (11/28/2021)  Depression (PHQ2-9): Medium Risk (05/01/2022)  Financial Resource Strain: High Risk (03/20/2020)  Tobacco Use: Low Risk  (09/28/2022)   SDOH Interventions:     Readmission Risk Interventions    09/29/2022   10:04 AM  Readmission Risk Prevention Plan  Transportation Screening Complete  Medication Review (RN Care Manager) Complete  PCP or Specialist appointment within 3-5 days of discharge Complete  HRI or Home Care Consult Complete  SW Recovery Care/Counseling Consult Complete  Palliative Care Screening Not Applicable  Skilled Nursing Facility Not Applicable

## 2022-09-29 NOTE — Discharge Summary (Signed)
Physician Discharge Summary   Patient: Jeremiah Vance MRN: [id number]  DOB: [birth year]   Admit:     Date of Admission: 09/27/2022 Admitted from: home   Discharge: Date of discharge: 09/29/22 Disposition: Home Condition at discharge: fair  CODE STATUS: FULL CODE     Discharge Physician: Sunnie Nielsen, DO [facility]     PCP: Debera Lat, PA-C  Recommendations for Outpatient Follow-up:  Follow up with oncology team ASAP to resume cancer care, follow CBC Please follow up on the following pending results: none PCP AND OTHER OUTPATIENT PROVIDERS: SEE BELOW FOR SPECIFIC DISCHARGE INSTRUCTIONS PRINTED FOR PATIENT IN ADDITION TO GENERIC AVS PATIENT INFO    Discharge Instructions     Diet full liquid   Complete by: As directed    Increase activity slowly   Complete by: As directed          Discharge Diagnoses: Principal Problem:   Symptomatic anemia Active Problems:   Iron deficiency anemia   GE junction carcinoma (HCC)   Atrial fibrillation, chronic (HCC)   Myocardial injury   Hyperlipidemia   Stroke (HCC)   HTN (hypertension)   Chronic combined systolic and diastolic congestive heart failure (HCC)   Chronic kidney disease, stage 3b (HCC)   Anxiety and depression   Type II diabetes mellitus with renal manifestations (HCC)   Morbid obesity (HCC)   GI bleed       Hospital Course: Jeremiah Vance is a 52 y.o. male with medical history significant of GE junction cancer- stage IV, HTN, HLD, CHF with EF 30-35%, stroke, gout, depression with anxiety, CKD-3B, obesity, A-fib on Eliquis, who presents from home to ED 09/27/22 with weakness x2 days. Sent to ED from cancer center when hgb noted to be 7.3 (was starting chemo). Eliwuis has been on hold for port-cath placement  06/28: Hgb 7.2 (vs 9.3 on 06/20). GI consulted.  06/29: EGD done. (+)oozing from gastric mass, will continue to watch HH and advance diet slowly.   Consultants:   Laurette Schimke  Procedures: EGD 09/28/22       ASSESSMENT & PLAN:   Symptomatic anemia w/ ABLA d/t GI bleed, complicated by chronic Iron deficiency anemia:  Hgb 9.3 on 09/19/22 --> 7.2 on admission.   due to GE junction carcinoma related bleeding.  S/p 2 units PRBC and IV Venofer x2 doses  S/p EGD  Hold Eliquis PPI Scopolamine for nausea  Avoid NSAIDs and SQ heparin   GE junction carcinoma (HCC): stage IV.  Pt was supposed to start first chemotherapy 06/28, which is canceled due to worsening anemia. Follow-up with Dr. Cathie Hoops of oncology   Myocardial injury:  Troponin level 41, no chest pain Will not give aspirin due to possible GI bleeding Lipitor   Chronic A fib:  HR 112--> 90s hold Eliquis   Hyperlipidemia Lipitor   History of Stroke (HCC) hold Eliquis Conitnue lipitor   Essential HTN (hypertension): Blood pressure 110/70 Held irbesartan and oral hydralazine given hypotension   Chronic combined systolic and diastolic congestive heart failure (HCC): 2D echo 11/23/2021 showed EF of 30 to 35% with grade 1 diastolic dysfunction.  Patient has trace leg edema, does not seem to have CHF exacerbation.  BNP is mildly elevated to 71. Torsemide 40 mg daily resumed    Chronic kidney disease, stage 3b [facility]): Renal function slightly worsening baseline.  Recent baseline creatinine 1.4-1.8.  His creatinine is 1.90, BUN 42, GFR 42 Avoid using renal toxic medications   Anxiety and depression  As needed Ativan BuSpar   Diabetes mellitus with renal complication: Recent A1c 6.1, well-controlled.  Patient is taking [place] Resume home meds     Morbid obesity (HCC): Body weight 107.7 kg, BMI 34.06 Encourage losing weight healthy diet            Discharge Instructions  Allergies as of 09/29/2022       Reactions   Pollen Extract Other (See Comments)   Sulfa Antibiotics         Medication List     STOP taking these medications    amLODipine 10 MG tablet Commonly known  as: NORVASC   apixaban 5 MG Tabs tablet Commonly known as: ELIQUIS   irbesartan 300 MG tablet Commonly known as: AVAPRO   lidocaine-prilocaine cream Commonly known as: EMLA   LORazepam 0.5 MG tablet Commonly known as: ATIVAN   ondansetron 8 MG disintegrating tablet Commonly known as: ZOFRAN-ODT   ondansetron 8 MG tablet Commonly known as: Zofran   oxyCODONE 5 MG immediate release tablet Commonly known as: Roxicodone   prochlorperazine 10 MG tablet Commonly known as: COMPAZINE       TAKE these medications    atorvastatin 40 MG tablet Commonly known as: LIPITOR Take 1 tablet (40 mg total) by mouth daily at 6 PM.   busPIRone 7.5 MG tablet Commonly known as: BUSPAR Take 1 tablet (7.5 mg total) by mouth 2 (two) times daily.   dapagliflozin propanediol 10 MG Tabs tablet Commonly known as: Farxiga Take 1 tablet (10 mg) by mouth once daily/please contact office to schedule follow up prior to further refills.   dexamethasone 4 MG tablet Commonly known as: DECADRON Take 2 tablets (8 mg total) by mouth daily. Start the day after chemotherapy for 2 days. Take with food.   FreeStyle Libre 2 Sensor Misc Place 1 sensor on the skin every 14 days. Use to check glucose continuously   hydrALAZINE 50 MG tablet Commonly known as: APRESOLINE Take 1 tablet (50 mg total) by mouth in the morning and at bedtime. please contact office to schedule follow up prior to further refills.   pantoprazole 40 MG tablet Commonly known as: PROTONIX Take 1 tablet (40 mg total) by mouth 2 (two) times daily before a meal.   scopolamine 1 MG/3DAYS Commonly known as: TRANSDERM-SCOP Place 1 patch (1.5 mg total) onto the skin every 3 (three) days.   torsemide 20 MG tablet Commonly known as: DEMADEX Take 2 tablets (40 mg total) by mouth daily as needed (fluid/edema). What changed:  when to take this reasons to take this         Follow-up Information     Rickard Patience, MD. Go to.   Specialty:  Oncology Why: appointments as directed Contact information: [address] [place] [address] [phone]                 Allergies  Allergen Reactions   Pollen Extract Other (See Comments)   Sulfa Antibiotics      Subjective: pt feeling okay today other than nausea, concnerd he doesn't have a ride to appointments but otherwise no problems.    Discharge Exam: BP 122/84 (BP Location: Right Arm)   Pulse (!) 102   Temp 99 F (37.2 C)   Resp 16   Ht 5\' 10"  (1.778 m)   Wt 110.5 kg   SpO2 92%   BMI 34.95 kg/m  General: Pt is alert, awake, not in acute distress Cardiovascular: RRR, S1/S2, no rubs, no gallops Respiratory:  CTA bilaterally, no wheezing, no rhonchi Abdominal: Soft, NT, ND, bowel sounds Extremities: no edema, no cyanosis     The results of significant diagnostics from this hospitalization (including imaging, microbiology, ancillary and laboratory) are listed below for reference.     Microbiology: No results found for this or any previous visit (from the past 240 hour(s)).   Labs: BNP (last 3 results) Recent Labs    11/29/21 1855 05/18/22 1040 09/27/22 1116  BNP 66.0 174.3* 271.9*   Basic Metabolic Panel: Recent Labs  Lab 09/27/22 0849 09/27/22 1110 09/28/22 0500  NA 133* 137 137  K 4.4 5.0 3.7  CL 100 103 104  CO2 23 23 24   GLUCOSE 228* 194* 109*  BUN 41* 42* 40*  CREATININE 1.78* 1.90* 1.73*  CALCIUM 8.5* 8.9 8.4*  MG  --  2.4  --    Liver Function Tests: Recent Labs  Lab 09/27/22 0849  AST 37  ALT 11  ALKPHOS 64  BILITOT 0.6  PROT 7.1  ALBUMIN 2.9*   No results for input(s): "LIPASE", "AMYLASE" in the last 168 hours. No results for input(s): "AMMONIA" in the last 168 hours. CBC: Recent Labs  Lab 09/27/22 0849 09/27/22 1110 09/27/22 1650 09/28/22 0500 09/28/22 1350 09/29/22 1000  WBC 6.9 6.9 7.1 6.9  --   --   NEUTROABS 5.0  --   --   --   --   --   HGB 7.3* 7.2* 7.8* 7.1* 7.9* 8.0*  HCT 23.9* 24.5*  25.7* 23.2* 25.1* 25.9*  MCV 81.8 84.2 82.1 82.9  --   --   PLT 318 299 250 231  --   --    Cardiac Enzymes: No results for input(s): "CKTOTAL", "CKMB", "CKMBINDEX", "TROPONINI" in the last 168 hours. BNP: Invalid input(s): "POCBNP" CBG: Recent Labs  Lab 09/28/22 1139 09/28/22 1608 09/28/22 2054 09/29/22 0736 09/29/22 1213  GLUCAP 100* 174* 144* 108* 191*   D-Dimer No results for input(s): "DDIMER" in the last 72 hours. Hgb A1c No results for input(s): "HGBA1C" in the last 72 hours. Lipid Profile Recent Labs    09/27/22 1200  CHOL 167  HDL 24*  LDLCALC 115*  TRIG 139  CHOLHDL 7.0   Thyroid function studies No results for input(s): "TSH", "T4TOTAL", "T3FREE", "THYROIDAB" in the last 72 hours.  Invalid input(s): "FREET3" Anemia work up No results for input(s): "VITAMINB12", "FOLATE", "FERRITIN", "TIBC", "IRON", "RETICCTPCT" in the last 72 hours. Urinalysis    Component Value Date/Time   COLORURINE STRAW (A) 09/27/2022 1100   APPEARANCEUR CLEAR (A) 09/27/2022 1100   LABSPEC 1.006 09/27/2022 1100   PHURINE 6.0 09/27/2022 1100   GLUCOSEU NEGATIVE 09/27/2022 1100   HGBUR NEGATIVE 09/27/2022 1100   BILIRUBINUR NEGATIVE 09/27/2022 1100   BILIRUBINUR negative 05/19/2020 1618   KETONESUR NEGATIVE 09/27/2022 1100   PROTEINUR NEGATIVE 09/27/2022 1100   UROBILINOGEN 0.2 05/19/2020 1618   UROBILINOGEN 2.0 (H) 10/21/2017 1102   NITRITE NEGATIVE 09/27/2022 1100   LEUKOCYTESUR TRACE (A) 09/27/2022 1100   Sepsis Labs Recent Labs  Lab 09/27/22 0849 09/27/22 1110 09/27/22 1650 09/28/22 0500  WBC 6.9 6.9 7.1 6.9   Microbiology No results found for this or any previous visit (from the past 240 hour(s)). Imaging No results found.    Time coordinating discharge: over 30 minutes  SIGNED:  Sunnie Nielsen DO [facility]

## 2022-09-30 ENCOUNTER — Ambulatory Visit: Payer: Medicaid Other

## 2022-09-30 ENCOUNTER — Encounter: Payer: Self-pay | Admitting: Gastroenterology

## 2022-09-30 ENCOUNTER — Encounter: Payer: Self-pay | Admitting: Oncology

## 2022-09-30 ENCOUNTER — Inpatient Hospital Stay: Payer: Medicaid Other

## 2022-09-30 ENCOUNTER — Inpatient Hospital Stay: Payer: Medicaid Other | Attending: Oncology

## 2022-09-30 ENCOUNTER — Telehealth: Payer: Self-pay

## 2022-09-30 ENCOUNTER — Other Ambulatory Visit: Payer: Self-pay

## 2022-09-30 ENCOUNTER — Other Ambulatory Visit: Payer: Self-pay | Admitting: Oncology

## 2022-09-30 VITALS — BP 102/68 | HR 71 | Temp 98.5°F | Resp 16

## 2022-09-30 DIAGNOSIS — Z95828 Presence of other vascular implants and grafts: Secondary | ICD-10-CM

## 2022-09-30 DIAGNOSIS — D509 Iron deficiency anemia, unspecified: Secondary | ICD-10-CM | POA: Diagnosis present

## 2022-09-30 DIAGNOSIS — C786 Secondary malignant neoplasm of retroperitoneum and peritoneum: Secondary | ICD-10-CM | POA: Diagnosis not present

## 2022-09-30 DIAGNOSIS — Z5111 Encounter for antineoplastic chemotherapy: Secondary | ICD-10-CM | POA: Diagnosis present

## 2022-09-30 DIAGNOSIS — D649 Anemia, unspecified: Secondary | ICD-10-CM

## 2022-09-30 DIAGNOSIS — C160 Malignant neoplasm of cardia: Secondary | ICD-10-CM | POA: Diagnosis present

## 2022-09-30 DIAGNOSIS — C778 Secondary and unspecified malignant neoplasm of lymph nodes of multiple regions: Secondary | ICD-10-CM | POA: Diagnosis not present

## 2022-09-30 DIAGNOSIS — Z79899 Other long term (current) drug therapy: Secondary | ICD-10-CM | POA: Diagnosis not present

## 2022-09-30 DIAGNOSIS — D500 Iron deficiency anemia secondary to blood loss (chronic): Secondary | ICD-10-CM

## 2022-09-30 LAB — CBC WITH DIFFERENTIAL (CANCER CENTER ONLY)
Abs Immature Granulocytes: 0.05 10*3/uL (ref 0.00–0.07)
Basophils Absolute: 0 10*3/uL (ref 0.0–0.1)
Basophils Relative: 1 %
Eosinophils Absolute: 0.3 10*3/uL (ref 0.0–0.5)
Eosinophils Relative: 4 %
HCT: 26 % — ABNORMAL LOW (ref 39.0–52.0)
Hemoglobin: 8 g/dL — ABNORMAL LOW (ref 13.0–17.0)
Immature Granulocytes: 1 %
Lymphocytes Relative: 11 %
Lymphs Abs: 0.8 10*3/uL (ref 0.7–4.0)
MCH: 25.7 pg — ABNORMAL LOW (ref 26.0–34.0)
MCHC: 30.8 g/dL (ref 30.0–36.0)
MCV: 83.6 fL (ref 80.0–100.0)
Monocytes Absolute: 0.6 10*3/uL (ref 0.1–1.0)
Monocytes Relative: 9 %
Neutro Abs: 5.2 10*3/uL (ref 1.7–7.7)
Neutrophils Relative %: 74 %
Platelet Count: 241 10*3/uL (ref 150–400)
RBC: 3.11 MIL/uL — ABNORMAL LOW (ref 4.22–5.81)
RDW: 18 % — ABNORMAL HIGH (ref 11.5–15.5)
WBC Count: 6.9 10*3/uL (ref 4.0–10.5)
nRBC: 0 % (ref 0.0–0.2)

## 2022-09-30 LAB — PREPARE RBC (CROSSMATCH)

## 2022-09-30 LAB — HEMOGLOBIN A1C
Hgb A1c MFr Bld: 5.9 % — ABNORMAL HIGH (ref 4.8–5.6)
Mean Plasma Glucose: 123 mg/dL

## 2022-09-30 LAB — TYPE AND SCREEN: Antibody Screen: NEGATIVE

## 2022-09-30 MED ORDER — HEPARIN SOD (PORK) LOCK FLUSH 100 UNIT/ML IV SOLN
500.0000 [IU] | Freq: Once | INTRAVENOUS | Status: AC
  Administered 2022-09-30: 500 [IU] via INTRAVENOUS
  Filled 2022-09-30: qty 5

## 2022-09-30 MED ORDER — SODIUM CHLORIDE 0.9 % IV SOLN
Freq: Once | INTRAVENOUS | Status: AC
  Filled 2022-09-30: qty 250

## 2022-09-30 MED ORDER — SODIUM CHLORIDE 0.9 % IV SOLN
200.0000 mg | Freq: Once | INTRAVENOUS | Status: AC
  Administered 2022-09-30: 200 mg via INTRAVENOUS
  Filled 2022-09-30: qty 200

## 2022-09-30 NOTE — Patient Instructions (Signed)

## 2022-09-30 NOTE — Transitions of Care (Post Inpatient/ED Visit) (Unsigned)
   09/30/2022  Name: Jeremiah Vance MRN: [id number] DOB: [birth year]  Today's TOC FU Call Status: Today's TOC FU Call Status:: Unsuccessul Call (1st Attempt) Unsuccessful Call (1st Attempt) Date: 09/30/22  Attempted to reach the patient regarding the most recent Inpatient/ED visit.  Follow Up Plan: Additional outreach attempts will be made to reach the patient to complete the Transitions of Care (Post Inpatient/ED visit) call.   Signature Karena Addison, LPN [facility] Nurse Health Advisor Direct Dial [phone]

## 2022-09-30 NOTE — Telephone Encounter (Signed)
FMLA completed and copied for chart. Patient hone went to voice mail so sister informed that it is ready to be picked up at registration desk. Sis ter states that patient is here for infusion and she will let him knw to get it

## 2022-10-01 ENCOUNTER — Inpatient Hospital Stay: Payer: Medicaid Other

## 2022-10-01 ENCOUNTER — Ambulatory Visit: Payer: Medicaid Other

## 2022-10-01 DIAGNOSIS — D649 Anemia, unspecified: Secondary | ICD-10-CM

## 2022-10-01 DIAGNOSIS — Z5111 Encounter for antineoplastic chemotherapy: Secondary | ICD-10-CM | POA: Diagnosis not present

## 2022-10-01 LAB — BPAM RBC: Blood Product Expiration Date: 202407072359

## 2022-10-01 LAB — TYPE AND SCREEN: Unit division: 0

## 2022-10-01 MED ORDER — DIPHENHYDRAMINE HCL 25 MG PO CAPS
25.0000 mg | ORAL_CAPSULE | Freq: Once | ORAL | Status: AC
  Administered 2022-10-01: 25 mg via ORAL
  Filled 2022-10-01: qty 1

## 2022-10-01 MED ORDER — HEPARIN SOD (PORK) LOCK FLUSH 100 UNIT/ML IV SOLN
500.0000 [IU] | Freq: Every day | INTRAVENOUS | Status: AC | PRN
  Administered 2022-10-01: 500 [IU]
  Filled 2022-10-01: qty 5

## 2022-10-01 MED ORDER — ACETAMINOPHEN 325 MG PO TABS
650.0000 mg | ORAL_TABLET | Freq: Once | ORAL | Status: AC
  Administered 2022-10-01: 650 mg via ORAL
  Filled 2022-10-01: qty 2

## 2022-10-01 MED ORDER — SODIUM CHLORIDE 0.9% IV SOLUTION
250.0000 mL | Freq: Once | INTRAVENOUS | Status: AC
  Administered 2022-10-01: 250 mL via INTRAVENOUS
  Filled 2022-10-01: qty 250

## 2022-10-01 MED FILL — Dexamethasone Sodium Phosphate Inj 100 MG/10ML: INTRAMUSCULAR | Qty: 1 | Status: AC

## 2022-10-01 NOTE — Transitions of Care (Post Inpatient/ED Visit) (Signed)
10/01/2022  Name: Jeremiah Vance MRN: [id number] DOB: 1971/01/14  Today's TOC FU Call Status: Today's TOC FU Call Status:: Unsuccessful Call (2nd Attempt) Unsuccessful Call (1st Attempt) Date: 09/30/22 Unsuccessful Call (2nd Attempt) Date: 10/01/22 [facility] FU Call Complete Date: 10/01/22  Transition Care Management Follow-up Telephone Call Date of Discharge: 09/29/22 Discharge Facility: [facility]) Type of Discharge: Inpatient Admission Primary Inpatient Discharge Diagnosis:: GI bleed Reason for ED Visit: Other: (GI Bleed) How have you been since you were released from the hospital?: Same Any questions or concerns?: No  Items Reviewed: Did you receive and understand the discharge instructions provided?: Yes Medications obtained,verified, and reconciled?: Yes (Medications Reviewed) Any new allergies since your discharge?: No Dietary orders reviewed?: Yes Do you have support at home?: Yes People in Home: sibling(s)  Medications Reviewed Today: Medications Reviewed Today     Reviewed by Karena Addison, LPN (Licensed Practical Nurse) on 10/01/22 at 1623  Med List Status: <None>   Medication Order Taking? Sig Documenting Provider Last Dose Status Informant  atorvastatin (LIPITOR) 40 MG tablet [id number] No Take 1 tablet (40 mg total) by mouth daily at 6 PM. Enedina Finner, MD Taking Active Self           Med Note Nelia Shi   Fri Sep 27, 2022 12:58 PM)    busPIRone (BUSPAR) 7.5 MG tablet [id number] No Take 1 tablet (7.5 mg total) by mouth 2 (two) times daily. Debera Lat, PA-C Taking Active   Continuous Blood Gluc Sensor (FREESTYLE LIBRE 2 SENSOR) MISC [id number] No Place 1 sensor on the skin every 14 days. Use to check glucose continuously Debera Lat, PA-C Taking Active Self  dapagliflozin propanediol (FARXIGA) 10 MG TABS tablet [id number] No Take 1 tablet (10 mg) by mouth once daily/please contact office to schedule follow up prior to further  refills. Debbe Odea, MD Taking Active Self           Med Note Nelia Shi   Fri Sep 27, 2022 12:58 PM)    dexamethasone (DECADRON) 4 MG tablet [id number] No Take 2 tablets (8 mg total) by mouth daily. Start the day after chemotherapy for 2 days. Take with food. Rickard Patience, MD Taking Active   hydrALAZINE (APRESOLINE) 50 MG tablet [id number] No Take 1 tablet (50 mg total) by mouth in the morning and at bedtime. please contact office to schedule follow up prior to further refills. Debbe Odea, MD Taking Active Self           Med Note Nelia Shi   Fri Sep 27, 2022 12:58 PM)    pantoprazole (PROTONIX) 40 MG tablet [id number]  Take 1 tablet (40 mg total) by mouth 2 (two) times daily before a meal. Sunnie Nielsen, DO  Active   scopolamine (TRANSDERM-SCOP) 1 MG/3DAYS [id number]  Place 1 patch (1.5 mg total) onto the skin every 3 (three) days. Sunnie Nielsen, DO  Active   torsemide (DEMADEX) 20 MG tablet [id number]  Take 2 tablets (40 mg total) by mouth daily as needed (fluid/edema). Sunnie Nielsen, DO  Active             Home Care and Equipment/Supplies: Were Home Health Services Ordered?: NA Any new equipment or medical supplies ordered?: NA  Functional Questionnaire: Do you need assistance with bathing/showering or dressing?: No Do you need assistance with meal preparation?: No Do you need assistance with eating?: No Do you have difficulty maintaining continence: No Do you need assistance with getting out of bed/getting  out of a chair/moving?: No Do you have difficulty managing or taking your medications?: No  Follow up appointments reviewed: PCP Follow-up appointment confirmed?: NA MD Provider Line [phone] Given: No Specialist Hospital Follow-up appointment confirmed?: Yes Date of Specialist follow-up appointment?: 10/01/22 Follow-Up Specialty Provider:: onco Do you need transportation to your follow-up appointment?: No Do you understand  care options if your condition(s) worsen?: Yes-patient verbalized understanding    SIGNATURE Karena Addison, LPN [facility] Nurse Health Advisor Direct Dial [phone]

## 2022-10-01 NOTE — Transitions of Care (Post Inpatient/ED Visit) (Signed)
   10/01/2022  Name: Jeremiah Vance MRN: [id number] DOB: 12-Oct-1970  Today's TOC FU Call Status: Today's TOC FU Call Status:: Unsuccessful Call (2nd Attempt) Unsuccessful Call (1st Attempt) Date: 09/30/22 Unsuccessful Call (2nd Attempt) Date: 10/01/22  Attempted to reach the patient regarding the most recent Inpatient/ED visit.  Follow Up Plan: Additional outreach attempts will be made to reach the patient to complete the Transitions of Care (Post Inpatient/ED visit) call.   Signature Karena Addison, LPN [facility] Nurse Health Advisor Direct Dial [phone]

## 2022-10-02 ENCOUNTER — Ambulatory Visit: Payer: Medicaid Other

## 2022-10-02 ENCOUNTER — Ambulatory Visit: Payer: Medicaid Other | Admitting: Physical Therapy

## 2022-10-02 ENCOUNTER — Inpatient Hospital Stay: Payer: Medicaid Other

## 2022-10-02 ENCOUNTER — Inpatient Hospital Stay (HOSPITAL_BASED_OUTPATIENT_CLINIC_OR_DEPARTMENT_OTHER): Payer: Medicaid Other | Admitting: Oncology

## 2022-10-02 ENCOUNTER — Encounter: Payer: Self-pay | Admitting: Oncology

## 2022-10-02 VITALS — BP 113/72 | HR 88 | Temp 96.7°F | Resp 18 | Wt 243.6 lb

## 2022-10-02 DIAGNOSIS — C160 Malignant neoplasm of cardia: Secondary | ICD-10-CM

## 2022-10-02 DIAGNOSIS — R634 Abnormal weight loss: Secondary | ICD-10-CM | POA: Diagnosis not present

## 2022-10-02 DIAGNOSIS — K7469 Other cirrhosis of liver: Secondary | ICD-10-CM | POA: Diagnosis not present

## 2022-10-02 DIAGNOSIS — Z5111 Encounter for antineoplastic chemotherapy: Secondary | ICD-10-CM | POA: Insufficient documentation

## 2022-10-02 DIAGNOSIS — I480 Paroxysmal atrial fibrillation: Secondary | ICD-10-CM

## 2022-10-02 DIAGNOSIS — D500 Iron deficiency anemia secondary to blood loss (chronic): Secondary | ICD-10-CM | POA: Diagnosis not present

## 2022-10-02 LAB — BPAM RBC
ISSUE DATE / TIME: 202407020913
Unit Type and Rh: 6200

## 2022-10-02 LAB — CBC WITH DIFFERENTIAL (CANCER CENTER ONLY)
Abs Immature Granulocytes: 0.02 10*3/uL (ref 0.00–0.07)
Basophils Absolute: 0 10*3/uL (ref 0.0–0.1)
Basophils Relative: 1 %
Eosinophils Absolute: 0.3 10*3/uL (ref 0.0–0.5)
Eosinophils Relative: 5 %
HCT: 28.3 % — ABNORMAL LOW (ref 39.0–52.0)
Hemoglobin: 8.9 g/dL — ABNORMAL LOW (ref 13.0–17.0)
Immature Granulocytes: 0 %
Lymphocytes Relative: 15 %
Lymphs Abs: 0.9 10*3/uL (ref 0.7–4.0)
MCH: 26.6 pg (ref 26.0–34.0)
MCHC: 31.4 g/dL (ref 30.0–36.0)
MCV: 84.5 fL (ref 80.0–100.0)
Monocytes Absolute: 0.6 10*3/uL (ref 0.1–1.0)
Monocytes Relative: 10 %
Neutro Abs: 4 10*3/uL (ref 1.7–7.7)
Neutrophils Relative %: 69 %
Platelet Count: 211 10*3/uL (ref 150–400)
RBC: 3.35 MIL/uL — ABNORMAL LOW (ref 4.22–5.81)
RDW: 19 % — ABNORMAL HIGH (ref 11.5–15.5)
WBC Count: 5.9 10*3/uL (ref 4.0–10.5)
nRBC: 0 % (ref 0.0–0.2)

## 2022-10-02 LAB — CMP (CANCER CENTER ONLY)
ALT: 10 U/L (ref 0–44)
AST: 34 U/L (ref 15–41)
Albumin: 3 g/dL — ABNORMAL LOW (ref 3.5–5.0)
Alkaline Phosphatase: 72 U/L (ref 38–126)
Anion gap: 9 (ref 5–15)
BUN: 29 mg/dL — ABNORMAL HIGH (ref 6–20)
CO2: 24 mmol/L (ref 22–32)
Calcium: 8.6 mg/dL — ABNORMAL LOW (ref 8.9–10.3)
Chloride: 101 mmol/L (ref 98–111)
Creatinine: 1.72 mg/dL — ABNORMAL HIGH (ref 0.61–1.24)
GFR, Estimated: 47 mL/min — ABNORMAL LOW (ref >60)
Glucose, Bld: 106 mg/dL — ABNORMAL HIGH (ref 70–99)
Potassium: 4 mmol/L (ref 3.5–5.1)
Sodium: 134 mmol/L — ABNORMAL LOW (ref 135–145)
Total Bilirubin: 0.8 mg/dL (ref 0.3–1.2)
Total Protein: 7.2 g/dL (ref 6.5–8.1)

## 2022-10-02 LAB — TYPE AND SCREEN: ABO/RH(D): A POS

## 2022-10-02 MED ORDER — SODIUM CHLORIDE 0.9 % IV SOLN
2400.0000 mg/m2 | INTRAVENOUS | Status: DC
  Administered 2022-10-02: 5550 mg via INTRAVENOUS
  Filled 2022-10-02: qty 111

## 2022-10-02 MED ORDER — LEUCOVORIN CALCIUM INJECTION 350 MG
400.0000 mg/m2 | Freq: Once | INTRAVENOUS | Status: AC
  Administered 2022-10-02: 928 mg via INTRAVENOUS
  Filled 2022-10-02: qty 46.4

## 2022-10-02 MED ORDER — PALONOSETRON HCL INJECTION 0.25 MG/5ML: 0.2500 mg | Freq: Once | INTRAVENOUS | Status: DC

## 2022-10-02 MED ORDER — DEXTROSE 5 % IV SOLN
Freq: Once | INTRAVENOUS | Status: AC
  Filled 2022-10-02: qty 250

## 2022-10-02 MED ORDER — SODIUM CHLORIDE 0.9 % IV SOLN
10.0000 mg | Freq: Once | INTRAVENOUS | Status: AC
  Administered 2022-10-02: 10 mg via INTRAVENOUS
  Filled 2022-10-02: qty 10
  Filled 2022-10-02 (×2): qty 1

## 2022-10-02 NOTE — Assessment & Plan Note (Signed)
LFT is stable.

## 2022-10-02 NOTE — Progress Notes (Signed)
Hematology/Oncology Progress note [phone] [phone]        REFERRING PROVIDER: Rickard Patience, MD    CHIEF COMPLAINTS/PURPOSE OF CONSULTATION:  GE junction cancer/gastric cardia cancer.  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 PET scan is done and the official report is pending.  Case was discussed at tumor board.  Preliminary PET scan showed multiple distant metastasis.  Stage IV disease. HER2 status and NGS molecular studies are pending.  May add immunotherapy if he is eligible. HER2 treatment may not be feasible due to CHF Labs are reviewed and discussed with patient. Proceed with 5- FU today. Due to his PS and multiple medical problems, I hold off Oxaliplatin today. Will see how he does with 5-FU and consider adding dose reduced Oxaliplatin in the future.   Other cirrhosis of liver (HCC) LFT is stable.   Paroxysmal atrial fibrillation (HCC) Hold Eliquis due to GI bleeding/anemia  IDA (iron deficiency anemia) Recommend additional IV Venofer treatments.  Hold off Eliquis    Weight loss Refer to nutritionist  Orders Placed This Encounter  Procedures   CBC with Differential (Cancer Center Only)    Standing Status:   Future    Standing Expiration Date:   10/15/2023   CMP (Cancer Center only)    Standing Status:   Future    Standing Expiration Date:   10/15/2023   CBC with Differential (Cancer Center Only)    Standing Status:   Future    Standing Expiration Date:   10/02/2023   CMP (Cancer Center only)    Standing Status:   Future    Standing Expiration Date:   10/02/2023   Sample to Blood Bank    Standing Status:   Future    Standing Expiration Date:   10/02/2023   Sample to Blood Bank    Standing Status:   Standing    Number  of Occurrences:   10    Standing Expiration Date:   10/02/2023   Follow-up in 1 week  All questions were answered. The patient knows to call the clinic with any problems, questions or concerns.  Rickard Patience, MD, PhD [facility] Hematology Oncology 10/02/2022    HISTORY OF PRESENTING ILLNESS:  Jeremiah Vance 52 y.o. male presents to establish care for  I have reviewed his chart and materials related to his cancer extensively and collaborated history with the patient. Summary of oncologic history is as follows: Oncology History  Gastric cancer (HCC)  09/08/2022 Imaging   CT abdomen pelvis w contrast     09/08/2022 Imaging   CT abdomen pelvis with contrast  Diffuse masslike thickening in the area of the GE junction and upper stomach with the extensive abnormal lymph nodes throughout the abdomen including retrocrural, porta hepatis, retroperitoneum and lesser and greater curve of the stomach. In addition there are areas of suggest peritoneal carcinomatosis. Recommend further evaluation.    No bowel obstruction. Large midline anterior pelvic wall hernia involving small bowel and mesenteric fat with rectus muscle diastasis. Gallstones.   09/19/2022 Cancer Staging   Staging form: Stomach, AJCC 8th Edition - Clinical stage from 09/19/2022: Stage IVB (cTX, cN2, cM1) - Signed by Rickard Patience, MD on 09/19/2022 Stage prefix: Initial diagnosis   09/19/2022 Initial Diagnosis   Gastric cancer (HCC)  09/08/2022, patient presented emergency room for evaluation of abdominal pain and vomiting.  He has also had weight loss.  Dysphagia with solid food for couple of months.  CT abdomen pelvis showed diffuse masslike thickening of the GE junction and upper stomach with extensive abnormal lymph nodes throughout abdomen.  09/10/2022 upper endoscopy showed malignant appearance gastric tumor at the GE junction and in the cardia Biopsied.  Nodular mucosa in the second portion of duodenum.  Biopsied. GE junction mass showed  moderate to poorly differentiated adenocarcinoma undermining squamous mucosa with focal area of squamous atypia.  Cannot exclude adenosquamous carcinoma.  Intestinal metaplasia is not identified. Duodenum cold biopsy-reactive duodenitis.  Negative for dysplasia and malignancy. Stomach random biopsy negative for H. pylori, intestinal metaplasia, dysplasia and malignancy.  Stomach Cardia mass biopsy showed moderate to poorly differentiated adenocarcinoma.  Patient's case was discussed on tumor board on 09/19/2022.  Per Dr. Oneita Kras, nephrology findings of both GE junction mass and gastric cardia mass or similar. HER2 staining is pending. Molecular testing is pending.     09/23/2022 Imaging   PET scan showed 1. 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 2. Enlarged cirrhotic liver. 3. Cholelithiasis. 4. Bilateral renal stones. 5. Umbilical hernias contain unobstructed small bowel and/or fat.  6. Aortic atherosclerosis (ICD10-I70.0). Coronary artery calcification. 7. Enlarged pulmonic trunk, indicative of pulmonary arterial hypertension.    10/02/2022 -  Chemotherapy   Patient is on Treatment Plan : GASTROESOPHAGEAL FOLFOX q14d     He lives at home with his sister.  Patient is undergoing neuro physical therapy. 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 A fib previously on Eliquis, currently on hold.   + During the interval he was admitted due to symptomatic anemia, s/p PRBC transfusion, Venofer treatments. S/p EGD. After discharge, he received additional one dose of Venofer and 1 PRBC transfusion.  Today he feels better. Appetite is poor. Denies hematochezia, hematuria, hematemesis, epistaxis, black tarry stool or easy bruising.      MEDICAL HISTORY:  Past Medical History:  Diagnosis Date   Acute ischemic stroke (HCC) 09/06/2017   Acute renal failure superimposed on stage 3a  chronic kidney disease (HCC) 07/08/2019   Acute right-sided weakness    Allergies    Anasarca    Anemia 07/10/2017   Arthritis    CHF (congestive heart failure) (HCC)    "coinsided w/kidney problems I was having 06/2017"   Chicken pox    CKD (chronic kidney disease) stage 3, GFR 30-59 ml/min (HCC) 06/2017   Depression    Elevated troponin 07/08/2019   High cholesterol    History of cardiomyopathy    LVEF 40 to 45% in April 2019 - subsequently normalized   Hyperbilirubinemia 07/10/2017   Hypertension    Ischemic stroke (HCC)    Small left internal capsule infarct due to lacunar disease   Morbid obesity (HCC)    Normocytic anemia 12/16/2017   Recurrent incisional hernia with incarceration s/p repair 10/22/2017 10/21/2017   Stroke (HCC) 08/2017   "right sided weakness since; getting stronger though" (10/22/2017)   Type 2 diabetes mellitus (HCC)     SURGICAL HISTORY: Past Surgical History:  Procedure Laterality Date   ABDOMINAL HERNIA REPAIR  2008; 10/22/2017   "scope; OPEN REPAIR INCARCERATED VENTRAL HERNIA   ESOPHAGOGASTRODUODENOSCOPY (EGD) WITH PROPOFOL N/A 09/10/2022   Procedure: ESOPHAGOGASTRODUODENOSCOPY (EGD) WITH PROPOFOL;  Surgeon: Toney Reil, MD;  Location: [facility] ENDOSCOPY;  Service: Gastroenterology;  Laterality: N/A;   ESOPHAGOGASTRODUODENOSCOPY (EGD) WITH PROPOFOL N/A 09/28/2022   Procedure: ESOPHAGOGASTRODUODENOSCOPY (EGD) WITH PROPOFOL;  Surgeon: Jaynie Collins, DO;  Location[facility] ENDOSCOPY;  Service: Gastroenterology;  Laterality: N/A;   HEMOSTASIS CONTROL  09/28/2022   Procedure: HEMOSTASIS CONTROL;  Surgeon: Jaynie Collins, DO;  Location: [facility] ENDOSCOPY;  Service: Gastroenterology;;   HERNIA REPAIR     KNEE ARTHROSCOPY Right 1989   PORTA CATH INSERTION N/A 09/25/2022   Procedure: PORTA CATH INSERTION;  Surgeon: Annice Needy, MD;  Location: [facility] INVASIVE CV LAB;  Service: Cardiovascular;  Laterality: N/A;   VENTRAL HERNIA REPAIR N/A 10/22/2017    Procedure: OPEN REPAIR INCARCERATED VENTRAL HERNIA;  Surgeon: Glenna Fellows, MD;  Location: [facility] OR;  Service: General;  Laterality: N/A;    SOCIAL HISTORY: Social History   Socioeconomic History   Marital status: Divorced    Spouse name: Not on file   Number of children: Not on file   Years of education: Not on file   Highest education level: Not on file  Occupational History   Not on file  Tobacco Use   Smoking status: Never   Smokeless tobacco: Never  Vaping Use   Vaping Use: Never used  Substance and Sexual Activity   Alcohol use: Not Currently    Comment: occasional beer   Drug use: Never   Sexual activity: Not Currently  Other Topics Concern   Not on file  Social History Narrative   He lives with his sister , [name] and she is his MPOA after his stokes   Social Determinants of Health   Financial Resource Strain: High Risk (03/20/2020)   Overall Financial Resource Strain (CARDIA)    Difficulty of Paying Living Expenses: Hard  Food Insecurity: No Food Insecurity (09/27/2022)   Hunger Vital Sign    Worried About Running Out of Food in the Last Year: Never true    Ran Out of Food in the Last Year: Never true  Transportation Needs: Unmet Transportation Needs (09/27/2022)   PRAPARE - Administrator, Civil Service (Medical): No    Lack of Transportation (Non-Medical): Yes  Physical Activity: Not on file  Stress: Not on file  Social Connections: Not on file  Intimate Partner Violence: Not At Risk (09/27/2022)   Humiliation, Afraid, Rape, and Kick questionnaire    Fear of Current or Ex-Partner: No    Emotionally Abused: No    Physically Abused: No    Sexually Abused: No    FAMILY HISTORY: Family History  Problem Relation Age of Onset   Diabetes Mother    Stroke Mother    Arthritis Mother    Depression Mother    Heart disease Mother    Hypertension Mother    Learning disabilities Mother    Mental illness Mother    Sleep apnea Mother     Diabetes Father    Heart disease Father    Arthritis Father    Hearing loss Father    Hyperlipidemia Father    Heart attack Father    Hypertension Father    Stroke Father    Diabetes Sister    Depression Sister    Diabetes Sister    Hypertension Sister    Mental illness Sister    Sleep apnea Sister    Diabetes Maternal Grandmother    Heart disease Maternal Grandmother    Depression Maternal Grandmother    Hyperlipidemia Maternal Grandmother    Hypertension Maternal Grandmother    Stroke Maternal Grandmother  Hypertension Maternal Grandfather    Hyperlipidemia Maternal Grandfather    Stroke Maternal Grandfather    Arthritis Paternal Grandmother    Hearing loss Paternal Grandmother    Stroke Paternal Grandfather    Heart disease Paternal Grandfather    Arthritis Paternal Grandfather    Heart attack Paternal Grandfather    Melanoma Other     ALLERGIES:  is allergic to pollen extract and sulfa antibiotics.  MEDICATIONS:  Current Outpatient Medications  Medication Sig Dispense Refill   atorvastatin (LIPITOR) 40 MG tablet Take 1 tablet (40 mg total) by mouth daily at 6 PM. 90 tablet 3   busPIRone (BUSPAR) 7.5 MG tablet Take 1 tablet (7.5 mg total) by mouth 2 (two) times daily. 60 tablet 3   Continuous Blood Gluc Sensor (FREESTYLE LIBRE 2 SENSOR) MISC Place 1 sensor on the skin every 14 days. Use to check glucose continuously 2 each 11   dapagliflozin propanediol (FARXIGA) 10 MG TABS tablet Take 1 tablet (10 mg) by mouth once daily/please contact office to schedule follow up prior to further refills. 180 tablet 0   hydrALAZINE (APRESOLINE) 50 MG tablet Take 1 tablet (50 mg total) by mouth in the morning and at bedtime. please contact office to schedule follow up prior to further refills. 180 tablet 0   pantoprazole (PROTONIX) 40 MG tablet Take 1 tablet (40 mg total) by mouth 2 (two) times daily before a meal. 60 tablet 0   scopolamine (TRANSDERM-SCOP) 1 MG/3DAYS Place 1 patch  (1.5 mg total) onto the skin every 3 (three) days. 4 patch 0   torsemide (DEMADEX) 20 MG tablet Take 2 tablets (40 mg total) by mouth daily as needed (fluid/edema). 90 tablet 3   dexamethasone (DECADRON) 4 MG tablet Take 2 tablets (8 mg total) by mouth daily. Start the day after chemotherapy for 2 days. Take with food. (Patient not taking: Reported on 10/02/2022) 30 tablet 1   No current facility-administered medications for this visit.    Review of Systems  Constitutional:  Positive for appetite change, fatigue and unexpected weight change. Negative for chills and fever.  HENT:   Negative for hearing loss and voice change.   Eyes:  Negative for eye problems and icterus.  Respiratory:  Negative for chest tightness, cough and shortness of breath.   Cardiovascular:  Negative for chest pain and leg swelling.  Gastrointestinal:  Positive for nausea. Negative for abdominal distention and abdominal pain.  Endocrine: Negative for hot flashes.  Genitourinary:  Negative for difficulty urinating, dysuria and frequency.   Musculoskeletal:  Negative for arthralgias.  Skin:  Negative for itching and rash.  Neurological:  Negative for light-headedness and numbness.  Hematological:  Negative for adenopathy. Does not bruise/bleed easily.  Psychiatric/Behavioral:  Negative for confusion.      PHYSICAL EXAMINATION: ECOG PERFORMANCE STATUS: 2 - Symptomatic, <50% confined to bed  Vitals:   10/02/22 0840  BP: 113/72  Pulse: 88  Resp: 18  Temp: (!) 96.7 F (35.9 C)  SpO2: 99%   Filed Weights   10/02/22 0840  Weight: 243 lb 9.7 oz (110.5 kg)    Physical Exam Constitutional:      General: He is not in acute distress.    Appearance: He is not diaphoretic.  HENT:     Head: Normocephalic and atraumatic.  Eyes:     General: No scleral icterus.    Pupils: Pupils are equal, round, and reactive to light.  Cardiovascular:     Rate and Rhythm: Normal rate.  Pulmonary:     Effort: Pulmonary effort  is normal. No respiratory distress.     Breath sounds: No wheezing.  Abdominal:     General: There is no distension.     Palpations: Abdomen is soft.     Tenderness: There is no abdominal tenderness.  Musculoskeletal:        General: Normal range of motion.     Cervical back: Normal range of motion and neck supple.  Skin:    General: Skin is warm and dry.     Coloration: Skin is pale.  Neurological:     Mental Status: He is alert and oriented to person, place, and time. Mental status is at baseline.     Cranial Nerves: No cranial nerve deficit.     Motor: No abnormal muscle tone.  Psychiatric:        Mood and Affect: Mood and affect normal.      LABORATORY DATA:  I have reviewed the data as listed    Latest Ref Rng & Units 10/02/2022    8:09 AM 09/30/2022    9:22 AM 09/29/2022   10:00 AM  CBC  WBC 4.0 - 10.5 K/uL 5.9  6.9    Hemoglobin 13.0 - 17.0 g/dL 8.9  8.0  8.0   Hematocrit 39.0 - 52.0 % 28.3  26.0  25.9   Platelets 150 - 400 K/uL 211  241        Latest Ref Rng & Units 10/02/2022    8:09 AM 09/28/2022    5:00 AM 09/27/2022   11:10 AM  CMP  Glucose 70 - 99 mg/dL 295  621  308   BUN 6 - 20 mg/dL 29  40  42   Creatinine 0.61 - 1.24 mg/dL 6.57  8.46  9.62   Sodium 135 - 145 mmol/L 134  137  137   Potassium 3.5 - 5.1 mmol/L 4.0  3.7  5.0   Chloride 98 - 111 mmol/L 101  104  103   CO2 22 - 32 mmol/L 24  24  23    Calcium 8.9 - 10.3 mg/dL 8.6  8.4  8.9   Total Protein 6.5 - 8.1 g/dL 7.2     Total Bilirubin 0.3 - 1.2 mg/dL 0.8     Alkaline Phos 38 - 126 U/L 72     AST 15 - 41 U/L 34     ALT 0 - 44 U/L 10        RADIOGRAPHIC STUDIES: I have personally reviewed the radiological images as listed and agreed with the findings in the report. PERIPHERAL VASCULAR CATHETERIZATION  Result Date: 09/25/2022 See surgical note for result.  NM PET Image Initial (PI) Skull Base To Thigh  Result Date: 09/23/2022 CLINICAL DATA:  Initial treatment strategy for peritoneal  carcinomatosis. EXAM: NUCLEAR MEDICINE PET SKULL BASE TO THIGH TECHNIQUE: 13.2 mCi F-18 FDG was injected intravenously. Full-ring PET imaging was performed from the skull base to thigh after the radiotracer. CT data was obtained and used for attenuation correction and anatomic localization. Fasting blood glucose: 120 mg/dl COMPARISON:  CT abdomen pelvis 09/08/2022. FINDINGS: Mediastinal blood pool activity: SUV max 2.4 Liver activity: SUV max 3.4 NECK: No abnormal hypermetabolism. Incidental CT findings: None. CHEST: Hypermetabolic thoracic inlet, mediastinal and distal periesophageal adenopathy. Index high right paratracheal lymph node measures 1.6 cm, SUV max 18.7. Incidental CT findings: Atherosclerotic calcification of the aorta, aortic valve and coronary arteries. Enlarged pulmonic trunk and heart. No pericardial or pleural effusion. Scattered tiny  pulmonary nodules are too small for PET resolution. ABDOMEN/PELVIS: Proximal gastric masslike wall thickening with associated hypermetabolism, SUV max 16.8. Extensive hypermetabolic retrocrural, abdominal peritoneal ligament, abdominal retroperitoneal and small bowel mesenteric adenopathy. Index left periaortic lymph node measures 1.6 cm (4/98), SUV max 14.4. Hypermetabolic omental nodularity with an index nodule in the left upper quadrant measuring 5 mm, SUV max 4.9. No additional abnormal hypermetabolism. Incidental CT findings: Liver is enlarged, 19.6 cm, and the margin is irregular. Stones in the gallbladder. Adrenal glands are unremarkable. Bilateral renal stones and renal vascular calcifications. Spleen and pancreas are grossly unremarkable. Unobstructed small bowel extends into a large umbilical hernia with a wide neck. A second left paramidline umbilical hernia contains fat. SKELETON: No abnormal hypermetabolism. Incidental CT findings: Degenerative changes in the spine. Probable vertebral body hemangiomas. IMPRESSION: 1. Gastric cancer with extensive  thoracoabdominal nodal and peritoneal metastatic disease. 2. Enlarged cirrhotic liver. 3. Cholelithiasis. 4. Bilateral renal stones. 5. Umbilical hernias contain unobstructed small bowel and/or fat. 6. Aortic atherosclerosis (ICD10-I70.0). Coronary artery calcification. 7. Enlarged pulmonic trunk, indicative of pulmonary arterial hypertension. Electronically Signed   By: Leanna Battles M.D.   On: 09/23/2022 11:44   CT ABDOMEN PELVIS W CONTRAST  Result Date: 09/08/2022 CLINICAL DATA:  Worsening abdominal pain. EXAM: CT ABDOMEN AND PELVIS WITH CONTRAST TECHNIQUE: Multidetector CT imaging of the abdomen and pelvis was performed using the standard protocol following bolus administration of intravenous contrast. RADIATION DOSE REDUCTION: This exam was performed according to the departmental dose-optimization program which includes automated exposure control, adjustment of the mA and/or kV according to patient size and/or use of iterative reconstruction technique. CONTRAST:  OMNIPAQUE IOHEXOL 350 MG/ML SOLN COMPARISON:  Noncontrast CT 02/20/2020. FINDINGS: Lower chest: Breathing motion at the lung bases. No pleural effusion. There are several small lung nodules identified. Example middle lobe series 4, image 17 measuring 5 mm. Other lesions identified are also similar to smaller in size. Distribution is similar to the previous examination. Over 2 years of stability. No specific imaging follow-up. Heart is enlarged. Coronary artery calcifications are seen. Please correlate for other coronary risk factors. Hepatobiliary: No enhancing liver lesion. Patent portal vein. Stones in the nondilated gallbladder. Pancreas: Mild atrophy of the pancreatic parenchyma. Spleen: Normal in size without focal abnormality. Adrenals/Urinary Tract: Stable left adrenal myelolipoma. Right adrenal gland is preserved. No enhancing renal mass or collecting system dilatation. The calcifications seen along each kidney actually could be  vascular based on the distribution. No collecting system dilatation. The ureters have normal course and caliber down to the bladder. Preserved contours of the urinary bladder. Stomach/Bowel: No oral contrast. The large bowel has a normal course and caliber with scattered stool and diverticula. Normal appendix. Small bowel is nondilated. There is nodular wall thickening along the upper stomach and GE junction region. Please correlate for a malignant lesion. Vascular/Lymphatic: Normal caliber aorta and IVC with scattered vascular calcifications. Extensive abnormal lymph node enlargement identified. This includes retrocrural on series 2, image 13 measuring 19 x 14 mm. Gastrohepatic ligament, periceliac region node left of midline on series 2, image 23 measuring 4.0 by 3.5 cm. Porta hepatic node series 2, image 25 measuring 3.4 by 2.4 cm. Left para-aortic lymph node more caudal on series 2, image 35 measuring 2.8 by 2.2 cm. Several other nodes are seen throughout these locations. There is also some mesenteric nodes, including along the greater curve of the stomach. Reproductive: Prostate is unremarkable. Other: Large midline anterior pelvic wall hernia with rectus muscle diastasis. Herniation  of small bowel and significant mesenteric fat. Scattered stranding. There are areas of nodularity along the peritoneum anteriorly such as left upper quadrant image 35 of series 2. There is some thickening of the lateral conal fascia on the left side. Please correlate for evidence of peritoneal carcinomatosis. No significant ascites. No free air. Musculoskeletal: Scattered degenerative changes of the spine and pelvis. Transitional lumbosacral segment. Skin thickening along the anterior pelvic wall and stranding. Mild anasarca. IMPRESSION: Diffuse masslike thickening in the area of the GE junction and upper stomach with the extensive abnormal lymph nodes throughout the abdomen including retrocrural, porta hepatis, retroperitoneum and  lesser and greater curve of the stomach. In addition there are areas of suggest peritoneal carcinomatosis. Recommend further evaluation. No bowel obstruction. Large midline anterior pelvic wall hernia involving small bowel and mesenteric fat with rectus muscle diastasis. Gallstones. Critical Value/emergent results were called by telephone at the time of interpretation on 09/08/2022 at 5:40 pm to provider [facility] , who verbally acknowledged these results. Electronically Signed   By: Karen Kays M.D.   On: 09/08/2022 20:52

## 2022-10-02 NOTE — Assessment & Plan Note (Signed)
Recommend additional IV Venofer treatments.  Hold off Eliquis

## 2022-10-02 NOTE — Assessment & Plan Note (Signed)
Refer to nutritionist 

## 2022-10-02 NOTE — Assessment & Plan Note (Signed)
Hold Eliquis due to GI bleeding/anemia

## 2022-10-02 NOTE — Progress Notes (Signed)
Ok to proceed with treatment, minus Oxaliplatin with labs outside of parameters per Dr Cathie Hoops.

## 2022-10-02 NOTE — Progress Notes (Signed)
Cr 1.72 ok to proceed per MD.  No oxal today and d/c aloxi today

## 2022-10-02 NOTE — Assessment & Plan Note (Addendum)
GE junction/gastric cardia adenocarcinoma. PET scan is done and the official report is pending.  Case was discussed at tumor board.  Preliminary PET scan showed multiple distant metastasis.  Stage IV disease. HER2 status and NGS molecular studies are pending.  May add immunotherapy if he is eligible. HER2 treatment may not be feasible due to CHF Labs are reviewed and discussed with patient. Proceed with 5- FU today. Due to his PS and multiple medical problems, I hold off Oxaliplatin today. Will see how he does with 5-FU and consider adding dose reduced Oxaliplatin in the future.

## 2022-10-02 NOTE — Patient Instructions (Signed)
[facility]  Discharge Instructions: Thank you for choosing [facility] to provide your oncology and hematology care.  If you have a lab appointment with the Cancer Center, please go directly to the Cancer Center and check in at the registration area.  Wear comfortable clothing and clothing appropriate for easy access to any Portacath or PICC line.   We strive to give you quality time with your provider. You may need to reschedule your appointment if you arrive late (15 or more minutes).  Arriving late affects you and other patients whose appointments are after yours.  Also, if you miss three or more appointments without notifying the office, you may be dismissed from the clinic at the provider's discretion.      For prescription refill requests, have your pharmacy contact our office and allow 72 hours for refills to be completed.    Today you received the following chemotherapy and/or immunotherapy agents Leucovorin and Adrucil       To help prevent nausea and vomiting after your treatment, we encourage you to take your nausea medication as directed.  BELOW ARE SYMPTOMS THAT SHOULD BE REPORTED IMMEDIATELY: *FEVER GREATER THAN 100.4 F (38 C) OR HIGHER *CHILLS OR SWEATING *NAUSEA AND VOMITING THAT IS NOT CONTROLLED WITH YOUR NAUSEA MEDICATION *UNUSUAL SHORTNESS OF BREATH *UNUSUAL BRUISING OR BLEEDING *URINARY PROBLEMS (pain or burning when urinating, or frequent urination) *BOWEL PROBLEMS (unusual diarrhea, constipation, pain near the anus) TENDERNESS IN MOUTH AND THROAT WITH OR WITHOUT PRESENCE OF ULCERS (sore throat, sores in mouth, or a toothache) UNUSUAL RASH, SWELLING OR PAIN  UNUSUAL VAGINAL DISCHARGE OR ITCHING   Items with * indicate a potential emergency and should be followed up as soon as possible or go to the Emergency Department if any problems should occur.  Please show the CHEMOTHERAPY ALERT CARD or IMMUNOTHERAPY ALERT CARD  at check-in to the Emergency Department and triage nurse.  Should you have questions after your visit or need to cancel or reschedule your appointment, please contact [facility]  [phone] and follow the prompts.  Office hours are 8:00 a.m. to 4:30 p.m. Monday - Friday. Please note that voicemails left after 4:00 p.m. may not be returned until the following business day.  We are closed weekends and major holidays. You have access to a nurse at all times for urgent questions. Please call the main number to the clinic [phone] and follow the prompts.  For any non-urgent questions, you may also contact your provider using MyChart. We now offer e-Visits for anyone 18 and older to request care online for non-urgent symptoms. For details visit [email or web address].   Also download the MyChart app! Go to the app store, search "MyChart", open the app, select [facility], and log in with your MyChart username and password.    

## 2022-10-04 ENCOUNTER — Inpatient Hospital Stay: Payer: Medicaid Other

## 2022-10-04 ENCOUNTER — Ambulatory Visit: Payer: Medicaid Other

## 2022-10-04 VITALS — BP 120/69 | HR 76 | Temp 97.3°F

## 2022-10-04 DIAGNOSIS — C160 Malignant neoplasm of cardia: Secondary | ICD-10-CM

## 2022-10-04 DIAGNOSIS — Z5111 Encounter for antineoplastic chemotherapy: Secondary | ICD-10-CM | POA: Diagnosis not present

## 2022-10-04 DIAGNOSIS — D500 Iron deficiency anemia secondary to blood loss (chronic): Secondary | ICD-10-CM

## 2022-10-04 LAB — SURGICAL PATHOLOGY

## 2022-10-04 LAB — CEA: CEA: 14.3 ng/mL — ABNORMAL HIGH (ref 0.0–4.7)

## 2022-10-04 MED ORDER — SODIUM CHLORIDE 0.9 % IV SOLN
200.0000 mg | Freq: Once | INTRAVENOUS | Status: AC
  Administered 2022-10-04: 200 mg via INTRAVENOUS
  Filled 2022-10-04: qty 200

## 2022-10-04 MED ORDER — SODIUM CHLORIDE 0.9% FLUSH
10.0000 mL | INTRAVENOUS | Status: DC | PRN
  Administered 2022-10-04: 10 mL
  Filled 2022-10-04: qty 10

## 2022-10-04 MED ORDER — HEPARIN SOD (PORK) LOCK FLUSH 100 UNIT/ML IV SOLN
INTRAVENOUS | Status: AC
  Filled 2022-10-04: qty 5

## 2022-10-04 MED ORDER — SODIUM CHLORIDE 0.9 % IV SOLN
Freq: Once | INTRAVENOUS | Status: AC
  Filled 2022-10-04: qty 250

## 2022-10-04 MED ORDER — HEPARIN SOD (PORK) LOCK FLUSH 100 UNIT/ML IV SOLN
500.0000 [IU] | Freq: Once | INTRAVENOUS | Status: DC | PRN
  Filled 2022-10-04: qty 5

## 2022-10-04 NOTE — Progress Notes (Signed)
[facility] CSW Counseling Note  Patient was referred by medical provider. Treatment type: Family  Presenting Concerns: Patient and/or family reports the following symptoms/concerns: depression Duration of problem: Several months; Severity of problem: moderate   Orientation:oriented to person, place, time/date, situation, day of week, month of year, and year.   Affect: Appropriate Risk of harm to self or others: No plan to harm self or others  Patient and/or Family's Strengths/Protective Factors: Social connectionsActive sense of humor  Communication skills  General fund of knowledge  Motivation for treatment/growth  Supportive family/friends      Goals Addressed: Patient will:  Reduce symptoms of: depression Increase knowledge and/or ability of: self-management skills  Increase healthy adjustment to current life circumstances   Progress towards Goals: Initial   Interventions: Interventions utilized:  Solution Focused, Supportive, and Family Systems      Assessment: Patient currently experiencing decreased depressive symptoms since he was hospitalized recently.  He said he was transfused twice, which gave him some short term increased energy.  Patient reports becoming sad when he thinks of his deceased parents and will isolate himself.  His sister, [name], was present and stated she will contact him several times a day to make sure he is eating, drinking, and taking his medications.  Explored various tools to help with these reminders, including a smart phone/watch with an alarm.  [name] and Swisher disclosed additional psychosocial information.      Plan: Follow up with CSW: Thursday, July 11, at [place]. [place] recommendations: Continue socializing with family members. Referral(s): Support group(s):  Living Well with Cancer.       Pascal Lux Angelica Wix, LCSW   Patient is participating in a Managed Medicaid Plan:  Yes

## 2022-10-04 NOTE — Patient Instructions (Signed)

## 2022-10-07 ENCOUNTER — Ambulatory Visit: Payer: Medicaid Other | Admitting: Physical Therapy

## 2022-10-07 ENCOUNTER — Encounter: Payer: Self-pay | Admitting: Oncology

## 2022-10-07 ENCOUNTER — Ambulatory Visit: Payer: Medicaid Other

## 2022-10-09 ENCOUNTER — Ambulatory Visit: Payer: Medicaid Other | Admitting: Physical Therapy

## 2022-10-09 ENCOUNTER — Ambulatory Visit: Payer: Medicaid Other

## 2022-10-10 ENCOUNTER — Inpatient Hospital Stay: Payer: Medicaid Other

## 2022-10-10 ENCOUNTER — Encounter: Payer: Self-pay | Admitting: Oncology

## 2022-10-10 ENCOUNTER — Inpatient Hospital Stay (HOSPITAL_BASED_OUTPATIENT_CLINIC_OR_DEPARTMENT_OTHER): Payer: Medicaid Other | Admitting: Oncology

## 2022-10-10 VITALS — BP 116/83 | HR 95 | Temp 96.0°F | Resp 18 | Wt 246.8 lb

## 2022-10-10 VITALS — BP 122/76 | HR 78

## 2022-10-10 DIAGNOSIS — C160 Malignant neoplasm of cardia: Secondary | ICD-10-CM | POA: Diagnosis not present

## 2022-10-10 DIAGNOSIS — K7469 Other cirrhosis of liver: Secondary | ICD-10-CM

## 2022-10-10 DIAGNOSIS — C801 Malignant (primary) neoplasm, unspecified: Secondary | ICD-10-CM | POA: Diagnosis not present

## 2022-10-10 DIAGNOSIS — R634 Abnormal weight loss: Secondary | ICD-10-CM | POA: Diagnosis not present

## 2022-10-10 DIAGNOSIS — Z5111 Encounter for antineoplastic chemotherapy: Secondary | ICD-10-CM | POA: Diagnosis not present

## 2022-10-10 DIAGNOSIS — F411 Generalized anxiety disorder: Secondary | ICD-10-CM | POA: Diagnosis not present

## 2022-10-10 DIAGNOSIS — I480 Paroxysmal atrial fibrillation: Secondary | ICD-10-CM | POA: Diagnosis not present

## 2022-10-10 DIAGNOSIS — D500 Iron deficiency anemia secondary to blood loss (chronic): Secondary | ICD-10-CM | POA: Diagnosis not present

## 2022-10-10 LAB — CMP (CANCER CENTER ONLY)
ALT: 29 U/L (ref 0–44)
AST: 28 U/L (ref 15–41)
Albumin: 3.1 g/dL — ABNORMAL LOW (ref 3.5–5.0)
Alkaline Phosphatase: 72 U/L (ref 38–126)
Anion gap: 8 (ref 5–15)
BUN: 28 mg/dL — ABNORMAL HIGH (ref 6–20)
CO2: 22 mmol/L (ref 22–32)
Calcium: 8.5 mg/dL — ABNORMAL LOW (ref 8.9–10.3)
Chloride: 106 mmol/L (ref 98–111)
Creatinine: 1.12 mg/dL (ref 0.61–1.24)
GFR, Estimated: >60 mL/min (ref >60)
Glucose, Bld: 149 mg/dL — ABNORMAL HIGH (ref 70–99)
Potassium: 4 mmol/L (ref 3.5–5.1)
Sodium: 136 mmol/L (ref 135–145)
Total Bilirubin: 0.9 mg/dL (ref 0.3–1.2)
Total Protein: 7 g/dL (ref 6.5–8.1)

## 2022-10-10 LAB — CBC WITH DIFFERENTIAL (CANCER CENTER ONLY)
Abs Immature Granulocytes: 0.1 10*3/uL — ABNORMAL HIGH (ref 0.00–0.07)
Basophils Absolute: 0.1 10*3/uL (ref 0.0–0.1)
Basophils Relative: 1 %
Eosinophils Absolute: 0.3 10*3/uL (ref 0.0–0.5)
Eosinophils Relative: 3 %
HCT: 30.1 % — ABNORMAL LOW (ref 39.0–52.0)
Hemoglobin: 9.2 g/dL — ABNORMAL LOW (ref 13.0–17.0)
Immature Granulocytes: 1 %
Lymphocytes Relative: 14 %
Lymphs Abs: 1.2 10*3/uL (ref 0.7–4.0)
MCH: 26.7 pg (ref 26.0–34.0)
MCHC: 30.6 g/dL (ref 30.0–36.0)
MCV: 87.5 fL (ref 80.0–100.0)
Monocytes Absolute: 0.3 10*3/uL (ref 0.1–1.0)
Monocytes Relative: 4 %
Neutro Abs: 6.7 10*3/uL (ref 1.7–7.7)
Neutrophils Relative %: 77 %
Platelet Count: 297 10*3/uL (ref 150–400)
RBC: 3.44 MIL/uL — ABNORMAL LOW (ref 4.22–5.81)
RDW: 19.8 % — ABNORMAL HIGH (ref 11.5–15.5)
WBC Count: 8.6 10*3/uL (ref 4.0–10.5)
nRBC: 0.3 % — ABNORMAL HIGH (ref 0.0–0.2)

## 2022-10-10 LAB — SAMPLE TO BLOOD BANK

## 2022-10-10 MED ORDER — SODIUM CHLORIDE 0.9% FLUSH
10.0000 mL | Freq: Once | INTRAVENOUS | Status: AC | PRN
  Administered 2022-10-10: 10 mL
  Filled 2022-10-10: qty 10

## 2022-10-10 MED ORDER — ONDANSETRON 8 MG PO TBDP: 8.0000 mg | ORAL_TABLET | Freq: Three times a day (TID) | ORAL | 1 refills | Status: DC | PRN

## 2022-10-10 MED ORDER — PROCHLORPERAZINE MALEATE 10 MG PO TABS: 10.0000 mg | ORAL_TABLET | Freq: Four times a day (QID) | ORAL | 0 refills | Status: DC | PRN

## 2022-10-10 MED ORDER — HEPARIN SOD (PORK) LOCK FLUSH 100 UNIT/ML IV SOLN
500.0000 [IU] | Freq: Once | INTRAVENOUS | Status: AC | PRN
  Administered 2022-10-10: 500 [IU]
  Filled 2022-10-10: qty 5

## 2022-10-10 MED ORDER — SODIUM CHLORIDE 0.9 % IV SOLN
200.0000 mg | Freq: Once | INTRAVENOUS | Status: AC
  Administered 2022-10-10: 200 mg via INTRAVENOUS
  Filled 2022-10-10: qty 200

## 2022-10-10 MED ORDER — SODIUM CHLORIDE 0.9 % IV SOLN
Freq: Once | INTRAVENOUS | Status: AC
  Filled 2022-10-10: qty 250

## 2022-10-10 MED FILL — Dexamethasone Sodium Phosphate Inj 100 MG/10ML: INTRAMUSCULAR | Qty: 1 | Status: AC

## 2022-10-10 NOTE — Assessment & Plan Note (Addendum)
Proceed with Venofer treatment today.

## 2022-10-10 NOTE — Assessment & Plan Note (Addendum)
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  NGS molecular studies are pending.  May add immunotherapy if he is eligible. Labs are reviewed and discussed with patient. Status post cycle one 5-FU Oxaliplatin was held due to cytopenia, performance status and other medical problems. Depending on his condition, may consider adding oxaliplatin  in the future.

## 2022-10-10 NOTE — Progress Notes (Signed)
Hematology/Oncology Progress note [phone] [phone]        REFERRING PROVIDER: Debera Lat, PA-C    CHIEF COMPLAINTS/PURPOSE OF CONSULTATION:  GE junction cancer/gastric cardia cancer.  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  NGS molecular studies are pending.  May add immunotherapy if he is eligible. Labs are reviewed and discussed with patient. Status post cycle one 5-FU Oxaliplatin was held due to cytopenia, performance status and other medical problems. Depending on his condition, may consider adding oxaliplatin  in the future.  IDA (iron deficiency anemia) Proceed with Venofer treatment today.   Weight loss Follow-up with nutritionist.  Continue nutrition supplements  Anxiety associated with cancer diagnosis (HCC) Recommend Ativan 0.5 mg every 12 hours as needed for anxiety/sleep/nausea  Other cirrhosis of liver (HCC) LFT is stable.   Paroxysmal atrial fibrillation (HCC) Hold Eliquis due to GI bleeding/anemia  No orders of the defined types were placed in this encounter.  Follow-up in 1 week  All questions were answered. The patient knows to call the clinic with any problems, questions or concerns.  Rickard Patience, MD, PhD [facility] Hematology Oncology 10/10/2022    HISTORY OF PRESENTING ILLNESS:  Jeremiah Vance 52 y.o. male presents to establish care for  I have reviewed his chart and materials related to his cancer extensively and collaborated history with the patient. Summary of oncologic history is as follows: Oncology History  Gastric cancer (HCC)  09/08/2022 Imaging   CT abdomen pelvis w contrast     09/08/2022 Imaging   CT abdomen pelvis  with contrast  Diffuse masslike thickening in the area of the GE junction and upper stomach with the extensive abnormal lymph nodes throughout the abdomen including retrocrural, porta hepatis, retroperitoneum and lesser and greater curve of the stomach. In addition there are areas of suggest peritoneal carcinomatosis. Recommend further evaluation.    No bowel obstruction. Large midline anterior pelvic wall hernia involving small bowel and mesenteric fat with rectus muscle diastasis. Gallstones.   09/19/2022 Cancer Staging   Staging form: Stomach, AJCC 8th Edition - Clinical stage from 09/19/2022: Stage IVB (cTX, cN2, cM1) - Signed by Rickard Patience, MD on 09/19/2022 Stage prefix: Initial diagnosis   09/19/2022 Initial Diagnosis   Gastric cancer (HCC)  09/08/2022, patient presented emergency room for evaluation of abdominal pain and vomiting.  He has also had weight loss.  Dysphagia with solid food for couple of months.  CT abdomen pelvis showed diffuse masslike thickening of the GE junction and upper stomach with extensive abnormal lymph nodes throughout abdomen.  09/10/2022 upper endoscopy showed malignant appearance gastric tumor at the GE junction and in the cardia Biopsied.  Nodular mucosa in the second portion of duodenum.  Biopsied. GE junction mass showed moderate to poorly differentiated adenocarcinoma undermining squamous mucosa with focal area of squamous atypia.  Cannot exclude adenosquamous carcinoma.  Intestinal metaplasia is not identified. Duodenum cold biopsy-reactive duodenitis.  Negative for dysplasia and malignancy. Stomach random biopsy negative for H. pylori, intestinal metaplasia, dysplasia and malignancy.  Stomach Cardia mass biopsy showed moderate to poorly differentiated adenocarcinoma.  Patient's case was discussed on tumor board on 09/19/2022.  Per Dr. Oneita Kras, nephrology findings of both GE junction mass and gastric cardia mass  or similar. HER2 staining is pending. Molecular  testing is pending.     09/23/2022 Imaging   PET scan showed 1. 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 2. Enlarged cirrhotic liver. 3. Cholelithiasis. 4. Bilateral renal stones. 5. Umbilical hernias contain unobstructed small bowel and/or fat.  6. Aortic atherosclerosis (ICD10-I70.0). Coronary artery calcification. 7. Enlarged pulmonic trunk, indicative of pulmonary arterial hypertension.    10/02/2022 -  Chemotherapy   Patient is on Treatment Plan : GASTROESOPHAGEAL FOLFOX q14d     He lives at home with his sister.  Patient is undergoing neuro physical therapy. 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 A fib previously on Eliquis, currently on hold.  INTERVAL HISTORY Jeremiah Vance is a 52 y.o. male who has above history reviewed by me today presents for follow up visit for stage IV GE junction/gastric cancer.  Status post cycle 1 chemotherapy with monotherapy 5-FU.  Overall he tolerates well.  He has also received IV Venofer treatments.  Fatigue level is slightly improved.  Weight remains stable.       MEDICAL HISTORY:  Past Medical History:  Diagnosis Date   Acute ischemic stroke (HCC) 09/06/2017   Acute renal failure superimposed on stage 3a chronic kidney disease (HCC) 07/08/2019   Acute right-sided weakness    Allergies    Anasarca    Anemia 07/10/2017   Arthritis    CHF (congestive heart failure) (HCC)    "coinsided w/kidney problems I was having 06/2017"   Chicken pox    CKD (chronic kidney disease) stage 3, GFR 30-59 ml/min (HCC) 06/2017   Depression    Elevated troponin 07/08/2019   High cholesterol    History of cardiomyopathy    LVEF 40 to 45% in April 2019 - subsequently normalized   Hyperbilirubinemia 07/10/2017   Hypertension    Ischemic stroke (HCC)    Small left internal capsule infarct due to lacunar disease   Morbid obesity (HCC)     Normocytic anemia 12/16/2017   Recurrent incisional hernia with incarceration s/p repair 10/22/2017 10/21/2017   Stroke (HCC) 08/2017   "right sided weakness since; getting stronger though" (10/22/2017)   Type 2 diabetes mellitus (HCC)     SURGICAL HISTORY: Past Surgical History:  Procedure Laterality Date   ABDOMINAL HERNIA REPAIR  2008; 10/22/2017   "scope; OPEN REPAIR INCARCERATED VENTRAL HERNIA   ESOPHAGOGASTRODUODENOSCOPY (EGD) WITH PROPOFOL N/A 09/10/2022   Procedure: ESOPHAGOGASTRODUODENOSCOPY (EGD) WITH PROPOFOL;  Surgeon: Toney Reil, MD;  Location: [facility] ENDOSCOPY;  Service: Gastroenterology;  Laterality: N/A;   ESOPHAGOGASTRODUODENOSCOPY (EGD) WITH PROPOFOL N/A 09/28/2022   Procedure: ESOPHAGOGASTRODUODENOSCOPY (EGD) WITH PROPOFOL;  Surgeon: Jaynie Collins, DO;  Location: [facility] ENDOSCOPY;  Service: Gastroenterology;  Laterality: N/A;   HEMOSTASIS CONTROL  09/28/2022   Procedure: HEMOSTASIS CONTROL;  Surgeon: Jaynie Collins, DO;  Location[facility] ENDOSCOPY;  Service: Gastroenterology;;   HERNIA REPAIR     KNEE ARTHROSCOPY Right 1989   PORTA CATH INSERTION N/A 09/25/2022   Procedure: PORTA CATH INSERTION;  Surgeon: Annice Needy, MD;  Location: [facility] INVASIVE CV LAB;  Service: Cardiovascular;  Laterality: N/A;   VENTRAL HERNIA REPAIR N/A 10/22/2017   Procedure: OPEN REPAIR INCARCERATED VENTRAL HERNIA;  Surgeon: Glenna Fellows, MD;  Location: [facility] OR;  Service: General;  Laterality: N/A;    SOCIAL HISTORY: Social History   Socioeconomic History   Marital status: Divorced    Spouse name: Not on file  Number of children: Not on file   Years of education: Not on file   Highest education level: Not on file  Occupational History   Not on file  Tobacco Use   Smoking status: Never   Smokeless tobacco: Never  Vaping Use   Vaping status: Never Used  Substance and Sexual Activity   Alcohol use: Not Currently    Comment: occasional beer   Drug use: Never   Sexual  activity: Not Currently  Other Topics Concern   Not on file  Social History Narrative   He lives with his sister , [name] and she is his MPOA after his stokes   Social Determinants of Health   Financial Resource Strain: High Risk (03/20/2020)   Overall Financial Resource Strain (CARDIA)    Difficulty of Paying Living Expenses: Hard  Food Insecurity: No Food Insecurity (09/27/2022)   Hunger Vital Sign    Worried About Running Out of Food in the Last Year: Never true    Ran Out of Food in the Last Year: Never true  Transportation Needs: Unmet Transportation Needs (09/27/2022)   PRAPARE - Administrator, Civil Service (Medical): No    Lack of Transportation (Non-Medical): Yes  Physical Activity: Not on file  Stress: Not on file  Social Connections: Not on file  Intimate Partner Violence: Not At Risk (09/27/2022)   Humiliation, Afraid, Rape, and Kick questionnaire    Fear of Current or Ex-Partner: No    Emotionally Abused: No    Physically Abused: No    Sexually Abused: No    FAMILY HISTORY: Family History  Problem Relation Age of Onset   Diabetes Mother    Stroke Mother    Arthritis Mother    Depression Mother    Heart disease Mother    Hypertension Mother    Learning disabilities Mother    Mental illness Mother    Sleep apnea Mother    Diabetes Father    Heart disease Father    Arthritis Father    Hearing loss Father    Hyperlipidemia Father    Heart attack Father    Hypertension Father    Stroke Father    Diabetes Sister    Depression Sister    Diabetes Sister    Hypertension Sister    Mental illness Sister    Sleep apnea Sister    Diabetes Maternal Grandmother    Heart disease Maternal Grandmother    Depression Maternal Grandmother    Hyperlipidemia Maternal Grandmother    Hypertension Maternal Grandmother    Stroke Maternal Grandmother    Hypertension Maternal Grandfather    Hyperlipidemia Maternal Grandfather    Stroke Maternal Grandfather     Arthritis Paternal Grandmother    Hearing loss Paternal Grandmother    Stroke Paternal Grandfather    Heart disease Paternal Grandfather    Arthritis Paternal Grandfather    Heart attack Paternal Grandfather    Melanoma Other     ALLERGIES:  is allergic to pollen extract and sulfa antibiotics.  MEDICATIONS:  Current Outpatient Medications  Medication Sig Dispense Refill   atorvastatin (LIPITOR) 40 MG tablet Take 1 tablet (40 mg total) by mouth daily at 6 PM. 90 tablet 3   busPIRone (BUSPAR) 7.5 MG tablet Take 1 tablet (7.5 mg total) by mouth 2 (two) times daily. 60 tablet 3   Continuous Blood Gluc Sensor (FREESTYLE LIBRE 2 SENSOR) MISC Place 1 sensor on the skin every 14 days. Use to check glucose continuously 2  each 11   dapagliflozin propanediol (FARXIGA) 10 MG TABS tablet Take 1 tablet (10 mg) by mouth once daily/please contact office to schedule follow up prior to further refills. 180 tablet 0   hydrALAZINE (APRESOLINE) 50 MG tablet Take 1 tablet (50 mg total) by mouth in the morning and at bedtime. please contact office to schedule follow up prior to further refills. 180 tablet 0   ondansetron (ZOFRAN-ODT) 8 MG disintegrating tablet Take 1 tablet (8 mg total) by mouth every 8 (eight) hours as needed for nausea or vomiting. 60 tablet 1   pantoprazole (PROTONIX) 40 MG tablet Take 1 tablet (40 mg total) by mouth 2 (two) times daily before a meal. 60 tablet 0   prochlorperazine (COMPAZINE) 10 MG tablet Take 1 tablet (10 mg total) by mouth every 6 (six) hours as needed for nausea or vomiting. 60 tablet 0   scopolamine (TRANSDERM-SCOP) 1 MG/3DAYS Place 1 patch (1.5 mg total) onto the skin every 3 (three) days. 4 patch 0   torsemide (DEMADEX) 20 MG tablet Take 2 tablets (40 mg total) by mouth daily as needed (fluid/edema). 90 tablet 3   dexamethasone (DECADRON) 4 MG tablet Take 2 tablets (8 mg total) by mouth daily. Start the day after chemotherapy for 2 days. Take with food. (Patient not  taking: Reported on 10/02/2022) 30 tablet 1   No current facility-administered medications for this visit.    Review of Systems  Constitutional:  Positive for appetite change, fatigue and unexpected weight change. Negative for chills and fever.  HENT:   Negative for hearing loss and voice change.   Eyes:  Negative for eye problems and icterus.  Respiratory:  Negative for chest tightness, cough and shortness of breath.   Cardiovascular:  Negative for chest pain and leg swelling.  Gastrointestinal:  Positive for nausea. Negative for abdominal distention and abdominal pain.  Endocrine: Negative for hot flashes.  Genitourinary:  Negative for difficulty urinating, dysuria and frequency.   Musculoskeletal:  Negative for arthralgias.  Skin:  Negative for itching and rash.  Neurological:  Negative for light-headedness and numbness.  Hematological:  Negative for adenopathy. Does not bruise/bleed easily.  Psychiatric/Behavioral:  Negative for confusion.      PHYSICAL EXAMINATION: ECOG PERFORMANCE STATUS: 2 - Symptomatic, <50% confined to bed  Vitals:   10/10/22 1437  BP: 116/83  Pulse: 95  Resp: 18  Temp: (!) 96 F (35.6 C)  SpO2: 100%   Filed Weights   10/10/22 1437  Weight: 246 lb 12.8 oz (111.9 kg)    Physical Exam Constitutional:      General: He is not in acute distress.    Appearance: He is not diaphoretic.  HENT:     Head: Normocephalic and atraumatic.  Eyes:     General: No scleral icterus.    Pupils: Pupils are equal, round, and reactive to light.  Cardiovascular:     Rate and Rhythm: Normal rate.  Pulmonary:     Effort: Pulmonary effort is normal. No respiratory distress.     Breath sounds: No wheezing.  Abdominal:     General: There is no distension.     Palpations: Abdomen is soft.     Tenderness: There is no abdominal tenderness.  Musculoskeletal:        General: Normal range of motion.     Cervical back: Normal range of motion and neck supple.  Skin:     General: Skin is warm and dry.     Coloration: Skin is pale.  Neurological:     Mental Status: He is alert and oriented to person, place, and time. Mental status is at baseline.     Cranial Nerves: No cranial nerve deficit.     Motor: No abnormal muscle tone.  Psychiatric:        Mood and Affect: Mood and affect normal.      LABORATORY DATA:  I have reviewed the data as listed    Latest Ref Rng & Units 10/10/2022    2:18 PM 10/02/2022    8:09 AM 09/30/2022    9:22 AM  CBC  WBC 4.0 - 10.5 K/uL 8.6  5.9  6.9   Hemoglobin 13.0 - 17.0 g/dL 9.2  8.9  8.0   Hematocrit 39.0 - 52.0 % 30.1  28.3  26.0   Platelets 150 - 400 K/uL 297  211  241       Latest Ref Rng & Units 10/10/2022    2:18 PM 10/02/2022    8:09 AM 09/28/2022    5:00 AM  CMP  Glucose 70 - 99 mg/dL 161  096  045   BUN 6 - 20 mg/dL 28  29  40   Creatinine 0.61 - 1.24 mg/dL 4.09  8.11  9.14   Sodium 135 - 145 mmol/L 136  134  137   Potassium 3.5 - 5.1 mmol/L 4.0  4.0  3.7   Chloride 98 - 111 mmol/L 106  101  104   CO2 22 - 32 mmol/L 22  24  24    Calcium 8.9 - 10.3 mg/dL 8.5  8.6  8.4   Total Protein 6.5 - 8.1 g/dL 7.0  7.2    Total Bilirubin 0.3 - 1.2 mg/dL 0.9  0.8    Alkaline Phos 38 - 126 U/L 72  72    AST 15 - 41 U/L 28  34    ALT 0 - 44 U/L 29  10       RADIOGRAPHIC STUDIES: I have personally reviewed the radiological images as listed and agreed with the findings in the report. PERIPHERAL VASCULAR CATHETERIZATION  Result Date: 09/25/2022 See surgical note for result.  NM PET Image Initial (PI) Skull Base To Thigh  Result Date: 09/23/2022 CLINICAL DATA:  Initial treatment strategy for peritoneal carcinomatosis. EXAM: NUCLEAR MEDICINE PET SKULL BASE TO THIGH TECHNIQUE: 13.2 mCi F-18 FDG was injected intravenously. Full-ring PET imaging was performed from the skull base to thigh after the radiotracer. CT data was obtained and used for attenuation correction and anatomic localization. Fasting blood glucose: 120 mg/dl  COMPARISON:  CT abdomen pelvis 09/08/2022. FINDINGS: Mediastinal blood pool activity: SUV max 2.4 Liver activity: SUV max 3.4 NECK: No abnormal hypermetabolism. Incidental CT findings: None. CHEST: Hypermetabolic thoracic inlet, mediastinal and distal periesophageal adenopathy. Index high right paratracheal lymph node measures 1.6 cm, SUV max 18.7. Incidental CT findings: Atherosclerotic calcification of the aorta, aortic valve and coronary arteries. Enlarged pulmonic trunk and heart. No pericardial or pleural effusion. Scattered tiny pulmonary nodules are too small for PET resolution. ABDOMEN/PELVIS: Proximal gastric masslike wall thickening with associated hypermetabolism, SUV max 16.8. Extensive hypermetabolic retrocrural, abdominal peritoneal ligament, abdominal retroperitoneal and small bowel mesenteric adenopathy. Index left periaortic lymph node measures 1.6 cm (4/98), SUV max 14.4. Hypermetabolic omental nodularity with an index nodule in the left upper quadrant measuring 5 mm, SUV max 4.9. No additional abnormal hypermetabolism. Incidental CT findings: Liver is enlarged, 19.6 cm, and the margin is irregular. Stones in the gallbladder. Adrenal glands are unremarkable. Bilateral  renal stones and renal vascular calcifications. Spleen and pancreas are grossly unremarkable. Unobstructed small bowel extends into a large umbilical hernia with a wide neck. A second left paramidline umbilical hernia contains fat. SKELETON: No abnormal hypermetabolism. Incidental CT findings: Degenerative changes in the spine. Probable vertebral body hemangiomas. IMPRESSION: 1. 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 2. Enlarged cirrhotic liver. 3. Cholelithiasis. 4. Bilateral renal stones. 5. Umbilical hernias contain unobstructed small bowel and/or fat. 6. Aortic atherosclerosis (ICD10-I70.0). Coronary artery calcification. 7. Enlarged pulmonic trunk, indicative of pulmonary arterial  hypertension. Electronically Signed   By: Leanna Battles M.D.   On: 09/23/2022 11:44

## 2022-10-10 NOTE — Assessment & Plan Note (Addendum)
Follow up with nutritionist.  Continue nutrition supplements 

## 2022-10-10 NOTE — Assessment & Plan Note (Signed)
Recommend Ativan 0.5 mg every 12 hours as needed for anxiety/sleep/nausea 

## 2022-10-10 NOTE — Assessment & Plan Note (Signed)
Hold Eliquis due to GI bleeding/anemia 

## 2022-10-10 NOTE — Patient Instructions (Signed)

## 2022-10-10 NOTE — Assessment & Plan Note (Signed)
LFT is stable.  

## 2022-10-10 NOTE — Addendum Note (Signed)
Addended by: Rickard Patience on: 10/10/2022 09:10 PM   Modules accepted: Orders

## 2022-10-10 NOTE — Patient Instructions (Signed)

## 2022-10-11 ENCOUNTER — Inpatient Hospital Stay: Payer: Medicaid Other

## 2022-10-14 ENCOUNTER — Ambulatory Visit: Payer: Medicaid Other

## 2022-10-14 ENCOUNTER — Ambulatory Visit: Payer: Medicaid Other | Admitting: Physical Therapy

## 2022-10-15 ENCOUNTER — Other Ambulatory Visit (HOSPITAL_COMMUNITY): Payer: Self-pay

## 2022-10-15 ENCOUNTER — Inpatient Hospital Stay: Payer: Medicaid Other

## 2022-10-15 ENCOUNTER — Encounter: Payer: Self-pay | Admitting: Oncology

## 2022-10-15 ENCOUNTER — Inpatient Hospital Stay (HOSPITAL_BASED_OUTPATIENT_CLINIC_OR_DEPARTMENT_OTHER): Payer: Medicaid Other | Admitting: Oncology

## 2022-10-15 ENCOUNTER — Ambulatory Visit: Payer: Medicaid Other

## 2022-10-15 VITALS — BP 139/80 | HR 64 | Temp 96.9°F | Resp 18 | Wt 251.0 lb

## 2022-10-15 DIAGNOSIS — I480 Paroxysmal atrial fibrillation: Secondary | ICD-10-CM

## 2022-10-15 DIAGNOSIS — C160 Malignant neoplasm of cardia: Secondary | ICD-10-CM

## 2022-10-15 DIAGNOSIS — F411 Generalized anxiety disorder: Secondary | ICD-10-CM | POA: Diagnosis not present

## 2022-10-15 DIAGNOSIS — D500 Iron deficiency anemia secondary to blood loss (chronic): Secondary | ICD-10-CM

## 2022-10-15 DIAGNOSIS — R634 Abnormal weight loss: Secondary | ICD-10-CM

## 2022-10-15 DIAGNOSIS — Z5111 Encounter for antineoplastic chemotherapy: Secondary | ICD-10-CM | POA: Diagnosis not present

## 2022-10-15 DIAGNOSIS — K7469 Other cirrhosis of liver: Secondary | ICD-10-CM | POA: Diagnosis not present

## 2022-10-15 DIAGNOSIS — C801 Malignant (primary) neoplasm, unspecified: Secondary | ICD-10-CM | POA: Diagnosis not present

## 2022-10-15 LAB — CMP (CANCER CENTER ONLY)
ALT: 15 U/L (ref 0–44)
AST: 18 U/L (ref 15–41)
Albumin: 3 g/dL — ABNORMAL LOW (ref 3.5–5.0)
Alkaline Phosphatase: 85 U/L (ref 38–126)
Anion gap: 8 (ref 5–15)
BUN: 26 mg/dL — ABNORMAL HIGH (ref 6–20)
CO2: 21 mmol/L — ABNORMAL LOW (ref 22–32)
Calcium: 8.6 mg/dL — ABNORMAL LOW (ref 8.9–10.3)
Chloride: 111 mmol/L (ref 98–111)
Creatinine: 1.3 mg/dL — ABNORMAL HIGH (ref 0.61–1.24)
GFR, Estimated: >60 mL/min (ref >60)
Glucose, Bld: 127 mg/dL — ABNORMAL HIGH (ref 70–99)
Potassium: 3.8 mmol/L (ref 3.5–5.1)
Sodium: 140 mmol/L (ref 135–145)
Total Bilirubin: 0.4 mg/dL (ref 0.3–1.2)
Total Protein: 6.9 g/dL (ref 6.5–8.1)

## 2022-10-15 LAB — CBC WITH DIFFERENTIAL (CANCER CENTER ONLY)
Abs Immature Granulocytes: 0.02 10*3/uL (ref 0.00–0.07)
Basophils Absolute: 0 10*3/uL (ref 0.0–0.1)
Basophils Relative: 0 %
Eosinophils Absolute: 0.2 10*3/uL (ref 0.0–0.5)
Eosinophils Relative: 4 %
HCT: 29.6 % — ABNORMAL LOW (ref 39.0–52.0)
Hemoglobin: 9 g/dL — ABNORMAL LOW (ref 13.0–17.0)
Immature Granulocytes: 0 %
Lymphocytes Relative: 17 %
Lymphs Abs: 0.8 10*3/uL (ref 0.7–4.0)
MCH: 26.9 pg (ref 26.0–34.0)
MCHC: 30.4 g/dL (ref 30.0–36.0)
MCV: 88.6 fL (ref 80.0–100.0)
Monocytes Absolute: 0.4 10*3/uL (ref 0.1–1.0)
Monocytes Relative: 8 %
Neutro Abs: 3.2 10*3/uL (ref 1.7–7.7)
Neutrophils Relative %: 71 %
Platelet Count: 167 10*3/uL (ref 150–400)
RBC: 3.34 MIL/uL — ABNORMAL LOW (ref 4.22–5.81)
RDW: 20.5 % — ABNORMAL HIGH (ref 11.5–15.5)
WBC Count: 4.5 10*3/uL (ref 4.0–10.5)
nRBC: 0 % (ref 0.0–0.2)

## 2022-10-15 LAB — SAMPLE TO BLOOD BANK

## 2022-10-15 MED ORDER — SODIUM CHLORIDE 0.9 % IV SOLN
10.0000 mg | Freq: Once | INTRAVENOUS | Status: AC
  Administered 2022-10-15: 10 mg via INTRAVENOUS
  Filled 2022-10-15: qty 10

## 2022-10-15 MED ORDER — SODIUM CHLORIDE 0.9 % IV SOLN
2400.0000 mg/m2 | INTRAVENOUS | Status: DC
  Administered 2022-10-15: 5550 mg via INTRAVENOUS
  Filled 2022-10-15: qty 111

## 2022-10-15 MED ORDER — PALONOSETRON HCL INJECTION 0.25 MG/5ML
0.2500 mg | Freq: Once | INTRAVENOUS | Status: AC
  Administered 2022-10-15: 0.25 mg via INTRAVENOUS
  Filled 2022-10-15: qty 5

## 2022-10-15 MED ORDER — DEXTROSE 5 % IV SOLN
Freq: Once | INTRAVENOUS | Status: AC
  Filled 2022-10-15: qty 250

## 2022-10-15 MED ORDER — SODIUM CHLORIDE 0.9 % IV SOLN
200.0000 mg | Freq: Once | INTRAVENOUS | Status: AC
  Administered 2022-10-15: 200 mg via INTRAVENOUS
  Filled 2022-10-15: qty 200

## 2022-10-15 MED ORDER — SODIUM CHLORIDE 0.9 % IV SOLN
10.0000 mg | Freq: Once | INTRAVENOUS | Status: DC
  Filled 2022-10-15: qty 1

## 2022-10-15 MED ORDER — LEUCOVORIN CALCIUM INJECTION 350 MG
400.0000 mg/m2 | Freq: Once | INTRAVENOUS | Status: AC
  Administered 2022-10-15: 928 mg via INTRAVENOUS
  Filled 2022-10-15: qty 46.4

## 2022-10-15 MED ORDER — OXALIPLATIN CHEMO INJECTION 100 MG/20ML
65.0000 mg/m2 | Freq: Once | INTRAVENOUS | Status: AC
  Administered 2022-10-15: 150 mg via INTRAVENOUS
  Filled 2022-10-15: qty 30

## 2022-10-15 NOTE — Assessment & Plan Note (Signed)
LFT is stable.

## 2022-10-15 NOTE — Assessment & Plan Note (Signed)
 Proceed with Venofer treatment today.

## 2022-10-15 NOTE — Assessment & Plan Note (Signed)
Hold Eliquis due to GI bleeding/anemia 

## 2022-10-15 NOTE — Assessment & Plan Note (Signed)
Recommend Ativan 0.5 mg every 12 hours as needed for anxiety/sleep/nausea 

## 2022-10-15 NOTE — Patient Instructions (Signed)
[facility] [place] [facility]  Discharge Instructions: Thank you for choosing [facility] to provide your oncology and hematology care.  If you have a lab appointment with the Cancer Center, please go directly to the Cancer Center and check in at the registration area.  Wear comfortable clothing and clothing appropriate for easy access to any Portacath or PICC line.   We strive to give you quality time with your provider. You may need to reschedule your appointment if you arrive late (15 or more minutes).  Arriving late affects you and other patients whose appointments are after yours.  Also, if you miss three or more appointments without notifying the office, you may be dismissed from the clinic at the provider's discretion.      For prescription refill requests, have your pharmacy contact our office and allow 72 hours for refills to be completed.    Today you received the following chemotherapy and/or immunotherapy agents- oxaliplatin, leucovorin, 5FU      To help prevent nausea and vomiting after your treatment, we encourage you to take your nausea medication as directed.  BELOW ARE SYMPTOMS THAT SHOULD BE REPORTED IMMEDIATELY: *FEVER GREATER THAN 100.4 F (38 C) OR HIGHER *CHILLS OR SWEATING *NAUSEA AND VOMITING THAT IS NOT CONTROLLED WITH YOUR NAUSEA MEDICATION *UNUSUAL SHORTNESS OF BREATH *UNUSUAL BRUISING OR BLEEDING *URINARY PROBLEMS (pain or burning when urinating, or frequent urination) *BOWEL PROBLEMS (unusual diarrhea, constipation, pain near the anus) TENDERNESS IN MOUTH AND THROAT WITH OR WITHOUT PRESENCE OF ULCERS (sore throat, sores in mouth, or a toothache) UNUSUAL RASH, SWELLING OR PAIN  UNUSUAL VAGINAL DISCHARGE OR ITCHING   Items with * indicate a potential emergency and should be followed up as soon as possible or go to the Emergency Department if any problems should occur.  Please show the CHEMOTHERAPY ALERT CARD or IMMUNOTHERAPY ALERT  CARD at check-in to the Emergency Department and triage nurse.  Should you have questions after your visit or need to cancel or reschedule your appointment, please contact [facility] [place] [facility]  [phone] and follow the prompts.  Office hours are 8:00 a.m. to 4:30 p.m. Monday - Friday. Please note that voicemails left after 4:00 p.m. may not be returned until the following business day.  We are closed weekends and major holidays. You have access to a nurse at all times for urgent questions. Please call the main number to the clinic [phone] and follow the prompts.  For any non-urgent questions, you may also contact your provider using MyChart. We now offer e-Visits for anyone 18 and older to request care online for non-urgent symptoms. For details visit [email or web address].   Also download the MyChart app! Go to the app store, search "MyChart", open the app, select , and log in with your MyChart username and password.    

## 2022-10-15 NOTE — Assessment & Plan Note (Addendum)
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  NGS molecular studies are pending.  May add immunotherapy if he is eligible. Labs are reviewed and discussed with patient. Proceed with cycle 2 chemotherapy, 5-FU, add dose reduced Oxaliplatin 65mg /m2  Rationale and side effects were reviewed with patient.

## 2022-10-15 NOTE — Progress Notes (Signed)
Hematology/Oncology Progress note [phone] [phone]        REFERRING PROVIDER: Rickard Patience, MD    CHIEF COMPLAINTS/PURPOSE OF CONSULTATION:  GE junction cancer/gastric cardia cancer.  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  NGS molecular studies are pending.  May add immunotherapy if he is eligible. Labs are reviewed and discussed with patient. Proceed with cycle 2 chemotherapy, 5-FU, add dose reduced Oxaliplatin 65mg /m2  Rationale and side effects were reviewed with patient.    Other cirrhosis of liver (HCC) LFT is stable.   Paroxysmal atrial fibrillation (HCC) Hold Eliquis due to GI bleeding/anemia  IDA (iron deficiency anemia) Proceed with Venofer treatment today.   Weight loss Follow-up with nutritionist.  Continue nutrition supplements  Anxiety associated with cancer diagnosis (HCC) Recommend Ativan 0.5 mg every 12 hours as needed for anxiety/sleep/nausea  Orders Placed This Encounter  Procedures   CBC with Differential (Cancer Center Only)    Standing Status:   Future    Standing Expiration Date:   10/15/2023   CMP (Cancer Center only)    Standing Status:   Future    Standing Expiration Date:   10/15/2023   Retic Panel    Standing Status:   Future    Standing Expiration Date:   10/15/2023   Sample to Blood Bank    Standing Status:   Future    Standing Expiration Date:   10/15/2023   Follow-up in 1 week lab NP + Venofer 2 weeks lab MD FOLFOX  All questions were answered. The patient knows to call the clinic with any problems, questions or concerns.  Rickard Patience, MD, PhD [facility] Hematology Oncology 10/15/2022    HISTORY OF PRESENTING ILLNESS:   Terique D Glockner 52 y.o. male presents to establish care for  I have reviewed his chart and materials related to his cancer extensively and collaborated history with the patient. Summary of oncologic history is as follows: Oncology History  Gastric cancer (HCC)  09/08/2022 Imaging   CT abdomen pelvis w contrast     09/08/2022 Imaging   CT abdomen pelvis with contrast  Diffuse masslike thickening in the area of the GE junction and upper stomach with the extensive abnormal lymph nodes throughout the abdomen including retrocrural, porta hepatis, retroperitoneum and lesser and greater curve of the stomach. In addition there are areas of suggest peritoneal carcinomatosis. Recommend further evaluation.    No bowel obstruction. Large midline anterior pelvic wall hernia involving small bowel and mesenteric fat with rectus muscle diastasis. Gallstones.   09/19/2022 Cancer Staging   Staging form: Stomach, AJCC 8th Edition - Clinical stage from 09/19/2022: Stage IVB (cTX, cN2, cM1) - Signed by Rickard Patience, MD on 09/19/2022 Stage prefix: Initial diagnosis   09/19/2022 Initial Diagnosis   Gastric cancer (HCC)  09/08/2022, patient presented emergency room for evaluation of abdominal pain and vomiting.  He has also had weight loss.  Dysphagia with solid food for couple of months.  CT abdomen pelvis showed diffuse masslike thickening of the GE junction and upper stomach with extensive abnormal lymph nodes throughout abdomen.  09/10/2022 upper endoscopy showed malignant appearance gastric tumor at the GE junction and in the cardia Biopsied.  Nodular mucosa in the second portion of  duodenum.  Biopsied. GE junction mass showed moderate to poorly differentiated adenocarcinoma undermining squamous mucosa with focal area of squamous atypia.  Cannot exclude adenosquamous carcinoma.  Intestinal metaplasia is not identified. Duodenum cold biopsy-reactive duodenitis.  Negative for dysplasia and malignancy. Stomach random  biopsy negative for H. pylori, intestinal metaplasia, dysplasia and malignancy.  Stomach Cardia mass biopsy showed moderate to poorly differentiated adenocarcinoma.  Patient's case was discussed on tumor board on 09/19/2022.  Per Dr. Oneita Kras, nephrology findings of both GE junction mass and gastric cardia mass or similar. HER2 staining is pending. Molecular testing is pending.     09/23/2022 Imaging   PET scan showed 1. 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 2. Enlarged cirrhotic liver. 3. Cholelithiasis. 4. Bilateral renal stones. 5. Umbilical hernias contain unobstructed small bowel and/or fat.  6. Aortic atherosclerosis (ICD10-I70.0). Coronary artery calcification. 7. Enlarged pulmonic trunk, indicative of pulmonary arterial hypertension.    10/02/2022 -  Chemotherapy   Patient is on Treatment Plan : GASTROESOPHAGEAL FOLFOX q14d     He lives at home with his sister.  Patient is undergoing neuro physical therapy. 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 A fib previously on Eliquis, currently on hold.  INTERVAL HISTORY Brooklyn D Fukushima is a 52 y.o. male who has above history reviewed by me today presents for follow up visit for stage IV GE junction/gastric cancer.  Status post cycle 1 chemotherapy with monotherapy 5-FU.  Overall he tolerates well.  He has also received IV Venofer treatments.  Fatigue level is slightly improved.  He has gained weight. Appetite has improved.    MEDICAL HISTORY:  Past Medical History:  Diagnosis Date   Acute ischemic stroke (HCC) 09/06/2017   Acute renal failure superimposed on stage 3a chronic kidney disease (HCC) 07/08/2019   Acute right-sided weakness    Allergies    Anasarca    Anemia 07/10/2017   Arthritis    CHF (congestive heart failure) (HCC)    "coinsided w/kidney problems I was having 06/2017"   Chicken pox    CKD (chronic  kidney disease) stage 3, GFR 30-59 ml/min (HCC) 06/2017   Depression    Elevated troponin 07/08/2019   High cholesterol    History of cardiomyopathy    LVEF 40 to 45% in April 2019 - subsequently normalized   Hyperbilirubinemia 07/10/2017   Hypertension    Ischemic stroke (HCC)    Small left internal capsule infarct due to lacunar disease   Morbid obesity (HCC)    Normocytic anemia 12/16/2017   Recurrent incisional hernia with incarceration s/p repair 10/22/2017 10/21/2017   Stroke (HCC) 08/2017   "right sided weakness since; getting stronger though" (10/22/2017)   Type 2 diabetes mellitus (HCC)     SURGICAL HISTORY: Past Surgical History:  Procedure Laterality Date   ABDOMINAL HERNIA REPAIR  2008; 10/22/2017   "scope; OPEN REPAIR INCARCERATED VENTRAL HERNIA   ESOPHAGOGASTRODUODENOSCOPY (EGD) WITH PROPOFOL N/A 09/10/2022   Procedure: ESOPHAGOGASTRODUODENOSCOPY (EGD) WITH PROPOFOL;  Surgeon: Toney Reil, MD;  Location: [facility] ENDOSCOPY;  Service: Gastroenterology;  Laterality: N/A;   ESOPHAGOGASTRODUODENOSCOPY (EGD) WITH PROPOFOL N/A 09/28/2022   Procedure: ESOPHAGOGASTRODUODENOSCOPY (EGD) WITH PROPOFOL;  Surgeon: Jaynie Collins, DO;  Location[facility] ENDOSCOPY;  Service: Gastroenterology;  Laterality: N/A;   HEMOSTASIS CONTROL  09/28/2022   Procedure: HEMOSTASIS CONTROL;  Surgeon: Jaynie Collins, DO;  Location: [facility] ENDOSCOPY;  Service: Gastroenterology;;   HERNIA REPAIR  KNEE ARTHROSCOPY Right 1989   PORTA CATH INSERTION N/A 09/25/2022   Procedure: PORTA CATH INSERTION;  Surgeon: Annice Needy, MD;  Location: [facility] INVASIVE CV LAB;  Service: Cardiovascular;  Laterality: N/A;   VENTRAL HERNIA REPAIR N/A 10/22/2017   Procedure: OPEN REPAIR INCARCERATED VENTRAL HERNIA;  Surgeon: Glenna Fellows, MD;  Location: [facility] OR;  Service: General;  Laterality: N/A;    SOCIAL HISTORY: Social History   Socioeconomic History   Marital status: Divorced    Spouse name: Not on file    Number of children: Not on file   Years of education: Not on file   Highest education level: Not on file  Occupational History   Not on file  Tobacco Use   Smoking status: Never   Smokeless tobacco: Never  Vaping Use   Vaping status: Never Used  Substance and Sexual Activity   Alcohol use: Not Currently    Comment: occasional beer   Drug use: Never   Sexual activity: Not Currently  Other Topics Concern   Not on file  Social History Narrative   He lives with his sister , [name] and she is his MPOA after his stokes   Social Determinants of Health   Financial Resource Strain: High Risk (03/20/2020)   Overall Financial Resource Strain (CARDIA)    Difficulty of Paying Living Expenses: Hard  Food Insecurity: No Food Insecurity (09/27/2022)   Hunger Vital Sign    Worried About Running Out of Food in the Last Year: Never true    Ran Out of Food in the Last Year: Never true  Transportation Needs: Unmet Transportation Needs (09/27/2022)   PRAPARE - Administrator, Civil Service (Medical): No    Lack of Transportation (Non-Medical): Yes  Physical Activity: Not on file  Stress: Not on file  Social Connections: Not on file  Intimate Partner Violence: Not At Risk (09/27/2022)   Humiliation, Afraid, Rape, and Kick questionnaire    Fear of Current or Ex-Partner: No    Emotionally Abused: No    Physically Abused: No    Sexually Abused: No    FAMILY HISTORY: Family History  Problem Relation Age of Onset   Diabetes Mother    Stroke Mother    Arthritis Mother    Depression Mother    Heart disease Mother    Hypertension Mother    Learning disabilities Mother    Mental illness Mother    Sleep apnea Mother    Diabetes Father    Heart disease Father    Arthritis Father    Hearing loss Father    Hyperlipidemia Father    Heart attack Father    Hypertension Father    Stroke Father    Diabetes Sister    Depression Sister    Diabetes Sister    Hypertension Sister     Mental illness Sister    Sleep apnea Sister    Diabetes Maternal Grandmother    Heart disease Maternal Grandmother    Depression Maternal Grandmother    Hyperlipidemia Maternal Grandmother    Hypertension Maternal Grandmother    Stroke Maternal Grandmother    Hypertension Maternal Grandfather    Hyperlipidemia Maternal Grandfather    Stroke Maternal Grandfather    Arthritis Paternal Grandmother    Hearing loss Paternal Grandmother    Stroke Paternal Grandfather    Heart disease Paternal Grandfather    Arthritis Paternal Grandfather    Heart attack Paternal Grandfather    Melanoma Other  ALLERGIES:  is allergic to pollen extract and sulfa antibiotics.  MEDICATIONS:  Current Outpatient Medications  Medication Sig Dispense Refill   atorvastatin (LIPITOR) 40 MG tablet Take 1 tablet (40 mg total) by mouth daily at 6 PM. 90 tablet 3   busPIRone (BUSPAR) 7.5 MG tablet Take 1 tablet (7.5 mg total) by mouth 2 (two) times daily. 60 tablet 3   Continuous Blood Gluc Sensor (FREESTYLE LIBRE 2 SENSOR) MISC Place 1 sensor on the skin every 14 days. Use to check glucose continuously 2 each 11   dapagliflozin propanediol (FARXIGA) 10 MG TABS tablet Take 1 tablet (10 mg) by mouth once daily/please contact office to schedule follow up prior to further refills. 180 tablet 0   hydrALAZINE (APRESOLINE) 50 MG tablet Take 1 tablet (50 mg total) by mouth in the morning and at bedtime. please contact office to schedule follow up prior to further refills. 180 tablet 0   ondansetron (ZOFRAN-ODT) 8 MG disintegrating tablet Take 1 tablet (8 mg total) by mouth every 8 (eight) hours as needed for nausea or vomiting. 60 tablet 1   pantoprazole (PROTONIX) 40 MG tablet Take 1 tablet (40 mg total) by mouth 2 (two) times daily before a meal. 60 tablet 0   prochlorperazine (COMPAZINE) 10 MG tablet Take 1 tablet (10 mg total) by mouth every 6 (six) hours as needed for nausea or vomiting. 60 tablet 0   scopolamine  (TRANSDERM-SCOP) 1 MG/3DAYS Place 1 patch (1.5 mg total) onto the skin every 3 (three) days. 4 patch 0   torsemide (DEMADEX) 20 MG tablet Take 2 tablets (40 mg total) by mouth daily as needed (fluid/edema). 90 tablet 3   dexamethasone (DECADRON) 4 MG tablet Take 2 tablets (8 mg total) by mouth daily. Start the day after chemotherapy for 2 days. Take with food. (Patient not taking: Reported on 10/02/2022) 30 tablet 1   No current facility-administered medications for this visit.   Facility-Administered Medications Ordered in Other Visits  Medication Dose Route Frequency Provider Last Rate Last Admin   fluorouracil (ADRUCIL) 5,550 mg in sodium chloride 0.9 % 139 mL chemo infusion  2,400 mg/m2 (Treatment Plan Recorded) Intravenous 1 day or 1 dose Rickard Patience, MD   Infusion Verify at 10/15/22 1333    Review of Systems  Constitutional:  Positive for fatigue and unexpected weight change. Negative for appetite change, chills and fever.  HENT:   Negative for hearing loss and voice change.   Eyes:  Negative for eye problems and icterus.  Respiratory:  Negative for chest tightness, cough and shortness of breath.   Cardiovascular:  Negative for chest pain and leg swelling.  Gastrointestinal:  Negative for abdominal distention, abdominal pain and nausea.  Endocrine: Negative for hot flashes.  Genitourinary:  Negative for difficulty urinating, dysuria and frequency.   Musculoskeletal:  Negative for arthralgias.  Skin:  Negative for itching and rash.  Neurological:  Negative for light-headedness and numbness.  Hematological:  Negative for adenopathy. Does not bruise/bleed easily.  Psychiatric/Behavioral:  Negative for confusion.      PHYSICAL EXAMINATION: ECOG PERFORMANCE STATUS: 2 - Symptomatic, <50% confined to bed  Vitals:   10/15/22 0907  BP: 139/80  Pulse: 64  Resp: 18  Temp: (!) 96.9 F (36.1 C)  SpO2: 96%   Filed Weights   10/15/22 0907  Weight: 251 lb (113.9 kg)    Physical  Exam Constitutional:      General: He is not in acute distress.    Appearance: He is  obese. He is not diaphoretic.  HENT:     Head: Normocephalic and atraumatic.  Eyes:     General: No scleral icterus.    Pupils: Pupils are equal, round, and reactive to light.  Cardiovascular:     Rate and Rhythm: Normal rate.  Pulmonary:     Effort: Pulmonary effort is normal. No respiratory distress.  Abdominal:     General: There is no distension.     Palpations: Abdomen is soft.     Tenderness: There is no abdominal tenderness.  Musculoskeletal:        General: Normal range of motion.     Cervical back: Normal range of motion and neck supple.  Skin:    General: Skin is warm and dry.  Neurological:     Mental Status: He is alert and oriented to person, place, and time. Mental status is at baseline.     Cranial Nerves: No cranial nerve deficit.     Motor: No abnormal muscle tone.  Psychiatric:        Mood and Affect: Mood and affect normal.      LABORATORY DATA:  I have reviewed the data as listed    Latest Ref Rng & Units 10/15/2022    8:33 AM 10/10/2022    2:18 PM 10/02/2022    8:09 AM  CBC  WBC 4.0 - 10.5 K/uL 4.5  8.6  5.9   Hemoglobin 13.0 - 17.0 g/dL 9.0  9.2  8.9   Hematocrit 39.0 - 52.0 % 29.6  30.1  28.3   Platelets 150 - 400 K/uL 167  297  211       Latest Ref Rng & Units 10/15/2022    8:33 AM 10/10/2022    2:18 PM 10/02/2022    8:09 AM  CMP  Glucose 70 - 99 mg/dL 161  096  045   BUN 6 - 20 mg/dL 26  28  29    Creatinine 0.61 - 1.24 mg/dL 4.09  8.11  9.14   Sodium 135 - 145 mmol/L 140  136  134   Potassium 3.5 - 5.1 mmol/L 3.8  4.0  4.0   Chloride 98 - 111 mmol/L 111  106  101   CO2 22 - 32 mmol/L 21  22  24    Calcium 8.9 - 10.3 mg/dL 8.6  8.5  8.6   Total Protein 6.5 - 8.1 g/dL 6.9  7.0  7.2   Total Bilirubin 0.3 - 1.2 mg/dL 0.4  0.9  0.8   Alkaline Phos 38 - 126 U/L 85  72  72   AST 15 - 41 U/L 18  28  34   ALT 0 - 44 U/L 15  29  10       RADIOGRAPHIC STUDIES: I  have personally reviewed the radiological images as listed and agreed with the findings in the report. PERIPHERAL VASCULAR CATHETERIZATION  Result Date: 09/25/2022 See surgical note for result.  NM PET Image Initial (PI) Skull Base To Thigh  Result Date: 09/23/2022 CLINICAL DATA:  Initial treatment strategy for peritoneal carcinomatosis. EXAM: NUCLEAR MEDICINE PET SKULL BASE TO THIGH TECHNIQUE: 13.2 mCi F-18 FDG was injected intravenously. Full-ring PET imaging was performed from the skull base to thigh after the radiotracer. CT data was obtained and used for attenuation correction and anatomic localization. Fasting blood glucose: 120 mg/dl COMPARISON:  CT abdomen pelvis 09/08/2022. FINDINGS: Mediastinal blood pool activity: SUV max 2.4 Liver activity: SUV max 3.4 NECK: No abnormal hypermetabolism. Incidental CT findings: None.  CHEST: Hypermetabolic thoracic inlet, mediastinal and distal periesophageal adenopathy. Index high right paratracheal lymph node measures 1.6 cm, SUV max 18.7. Incidental CT findings: Atherosclerotic calcification of the aorta, aortic valve and coronary arteries. Enlarged pulmonic trunk and heart. No pericardial or pleural effusion. Scattered tiny pulmonary nodules are too small for PET resolution. ABDOMEN/PELVIS: Proximal gastric masslike wall thickening with associated hypermetabolism, SUV max 16.8. Extensive hypermetabolic retrocrural, abdominal peritoneal ligament, abdominal retroperitoneal and small bowel mesenteric adenopathy. Index left periaortic lymph node measures 1.6 cm (4/98), SUV max 14.4. Hypermetabolic omental nodularity with an index nodule in the left upper quadrant measuring 5 mm, SUV max 4.9. No additional abnormal hypermetabolism. Incidental CT findings: Liver is enlarged, 19.6 cm, and the margin is irregular. Stones in the gallbladder. Adrenal glands are unremarkable. Bilateral renal stones and renal vascular calcifications. Spleen and pancreas are grossly  unremarkable. Unobstructed small bowel extends into a large umbilical hernia with a wide neck. A second left paramidline umbilical hernia contains fat. SKELETON: No abnormal hypermetabolism. Incidental CT findings: Degenerative changes in the spine. Probable vertebral body hemangiomas. IMPRESSION: 1. 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 2. Enlarged cirrhotic liver. 3. Cholelithiasis. 4. Bilateral renal stones. 5. Umbilical hernias contain unobstructed small bowel and/or fat. 6. Aortic atherosclerosis (ICD10-I70.0). Coronary artery calcification. 7. Enlarged pulmonic trunk, indicative of pulmonary arterial hypertension. Electronically Signed   By: Leanna Battles M.D.   On: 09/23/2022 11:44

## 2022-10-15 NOTE — Assessment & Plan Note (Signed)
Follow up with nutritionist.  Continue nutrition supplements

## 2022-10-15 NOTE — Addendum Note (Signed)
Addended by: Rickard Patience on: 10/15/2022 10:48 PM   Modules accepted: Orders

## 2022-10-16 ENCOUNTER — Ambulatory Visit: Payer: Medicaid Other

## 2022-10-16 ENCOUNTER — Other Ambulatory Visit (HOSPITAL_COMMUNITY): Payer: Self-pay

## 2022-10-16 ENCOUNTER — Ambulatory Visit: Payer: Medicaid Other | Admitting: Physical Therapy

## 2022-10-16 LAB — CEA: CEA: 3.9 ng/mL (ref 0.0–4.7)

## 2022-10-17 ENCOUNTER — Inpatient Hospital Stay: Payer: Medicaid Other

## 2022-10-17 VITALS — BP 142/72 | HR 70 | Temp 98.1°F | Resp 16

## 2022-10-17 DIAGNOSIS — Z5111 Encounter for antineoplastic chemotherapy: Secondary | ICD-10-CM | POA: Diagnosis not present

## 2022-10-17 DIAGNOSIS — C160 Malignant neoplasm of cardia: Secondary | ICD-10-CM

## 2022-10-17 MED ORDER — HEPARIN SOD (PORK) LOCK FLUSH 100 UNIT/ML IV SOLN
500.0000 [IU] | Freq: Once | INTRAVENOUS | Status: AC | PRN
  Administered 2022-10-17: 500 [IU]
  Filled 2022-10-17: qty 5

## 2022-10-17 MED ORDER — SODIUM CHLORIDE 0.9% FLUSH
10.0000 mL | INTRAVENOUS | Status: DC | PRN
  Administered 2022-10-17: 10 mL
  Filled 2022-10-17: qty 10

## 2022-10-18 ENCOUNTER — Inpatient Hospital Stay: Payer: Medicaid Other

## 2022-10-21 ENCOUNTER — Ambulatory Visit: Payer: Medicaid Other

## 2022-10-21 ENCOUNTER — Ambulatory Visit: Payer: Medicaid Other | Admitting: Physical Therapy

## 2022-10-22 ENCOUNTER — Encounter: Payer: Self-pay | Admitting: Podiatry

## 2022-10-22 ENCOUNTER — Ambulatory Visit: Payer: Medicaid Other | Admitting: Podiatry

## 2022-10-22 VITALS — BP 164/92 | HR 72

## 2022-10-22 DIAGNOSIS — M79674 Pain in right toe(s): Secondary | ICD-10-CM | POA: Diagnosis not present

## 2022-10-22 DIAGNOSIS — B351 Tinea unguium: Secondary | ICD-10-CM

## 2022-10-22 DIAGNOSIS — M79675 Pain in left toe(s): Secondary | ICD-10-CM | POA: Diagnosis not present

## 2022-10-22 NOTE — Progress Notes (Signed)
   SUBJECTIVE Patient with a history of diabetes mellitus presents to office today complaining of elongated, thickened nails that cause pain while ambulating in shoes.  He is unable to trim his own nails. Patient is here for further evaluation and treatment.   Past Medical History:  Diagnosis Date   Acute ischemic stroke (HCC) 09/06/2017   Acute renal failure superimposed on stage 3a chronic kidney disease (HCC) 07/08/2019   Acute right-sided weakness    Allergies    Anasarca    Anemia 07/10/2017   Arthritis    CHF (congestive heart failure) (HCC)    "coinsided w/kidney problems I was having 06/2017"   Chicken pox    CKD (chronic kidney disease) stage 3, GFR 30-59 ml/min (HCC) 06/2017   Depression    Elevated troponin 07/08/2019   High cholesterol    History of cardiomyopathy    LVEF 40 to 45% in April 2019 - subsequently normalized   Hyperbilirubinemia 07/10/2017   Hypertension    Ischemic stroke (HCC)    Small left internal capsule infarct due to lacunar disease   Morbid obesity (HCC)    Normocytic anemia 12/16/2017   Recurrent incisional hernia with incarceration s/p repair 10/22/2017 10/21/2017   Stroke (HCC) 08/2017   "right sided weakness since; getting stronger though" (10/22/2017)   Type 2 diabetes mellitus (HCC)     OBJECTIVE General Patient is awake, alert, and oriented x 3 and in no acute distress. Derm Skin is dry and supple bilateral. Negative open lesions or macerations. Remaining integument unremarkable. Nails are tender, long, thickened and dystrophic with subungual debris, consistent with onychomycosis, 1-5 bilateral. No signs of infection noted.  Hyperkeratotic preulcerative calluses noted to the bilateral toes Vasc  DP and PT pedal pulses palpable bilaterally. Temperature gradient within normal limits.  Neuro Epicritic and protective threshold sensation diminished bilaterally.  Musculoskeletal Exam No symptomatic pedal deformities noted bilateral. Muscular strength  within normal limits.  ASSESSMENT 1. Diabetes Mellitus w/ peripheral neuropathy 2. Onychomycosis of nail due to dermatophyte bilateral 3.  Preulcerative callus lesions bilateral feet  4.  Pain in foot bilateral  PLAN OF CARE 1. Patient evaluated today. 2. Instructed to maintain good pedal hygiene and foot care. Stressed importance of controlling blood sugar.  3. Mechanical debridement of nails 1-5 bilaterally performed using a nail nipper. Filed with dremel without incident.  4.  Excisional debridement of the hyperkeratotic callus tissue was performed using a tissue nipper without incident or bleeding  5.  Return to clinic in 3 mos.     Brent M. Evans, DPM [facility]  Dr. Brent M. Evans, DPM    [address]                                      [place], [place] [address]                Office [phone]  Fax [phone]     

## 2022-10-23 ENCOUNTER — Ambulatory Visit: Payer: BC Managed Care – PPO | Admitting: Physician Assistant

## 2022-10-23 ENCOUNTER — Inpatient Hospital Stay: Payer: Medicaid Other

## 2022-10-23 ENCOUNTER — Other Ambulatory Visit (HOSPITAL_COMMUNITY): Payer: Self-pay

## 2022-10-23 ENCOUNTER — Telehealth: Payer: Self-pay | Admitting: Physician Assistant

## 2022-10-23 ENCOUNTER — Encounter: Payer: Self-pay | Admitting: Medical Oncology

## 2022-10-23 ENCOUNTER — Ambulatory Visit: Payer: Medicaid Other

## 2022-10-23 ENCOUNTER — Inpatient Hospital Stay: Payer: Medicaid Other | Admitting: Medical Oncology

## 2022-10-23 ENCOUNTER — Ambulatory Visit: Payer: Medicaid Other | Admitting: Physical Therapy

## 2022-10-23 ENCOUNTER — Other Ambulatory Visit: Payer: Self-pay | Admitting: Physician Assistant

## 2022-10-23 VITALS — BP 141/82 | HR 65 | Temp 97.2°F | Resp 18 | Wt 255.0 lb

## 2022-10-23 DIAGNOSIS — D500 Iron deficiency anemia secondary to blood loss (chronic): Secondary | ICD-10-CM

## 2022-10-23 DIAGNOSIS — K7469 Other cirrhosis of liver: Secondary | ICD-10-CM | POA: Diagnosis not present

## 2022-10-23 DIAGNOSIS — I480 Paroxysmal atrial fibrillation: Secondary | ICD-10-CM | POA: Diagnosis not present

## 2022-10-23 DIAGNOSIS — D649 Anemia, unspecified: Secondary | ICD-10-CM | POA: Diagnosis not present

## 2022-10-23 DIAGNOSIS — C160 Malignant neoplasm of cardia: Secondary | ICD-10-CM

## 2022-10-23 DIAGNOSIS — F419 Anxiety disorder, unspecified: Secondary | ICD-10-CM

## 2022-10-23 DIAGNOSIS — E1165 Type 2 diabetes mellitus with hyperglycemia: Secondary | ICD-10-CM

## 2022-10-23 DIAGNOSIS — I10 Essential (primary) hypertension: Secondary | ICD-10-CM

## 2022-10-23 DIAGNOSIS — R634 Abnormal weight loss: Secondary | ICD-10-CM

## 2022-10-23 DIAGNOSIS — F4321 Adjustment disorder with depressed mood: Secondary | ICD-10-CM

## 2022-10-23 DIAGNOSIS — Z5111 Encounter for antineoplastic chemotherapy: Secondary | ICD-10-CM | POA: Diagnosis not present

## 2022-10-23 LAB — CBC WITH DIFFERENTIAL (CANCER CENTER ONLY)
Abs Immature Granulocytes: 0.02 10*3/uL (ref 0.00–0.07)
Basophils Absolute: 0 10*3/uL (ref 0.0–0.1)
Basophils Relative: 1 %
Eosinophils Absolute: 0.4 10*3/uL (ref 0.0–0.5)
Eosinophils Relative: 8 %
HCT: 30.7 % — ABNORMAL LOW (ref 39.0–52.0)
Hemoglobin: 9.3 g/dL — ABNORMAL LOW (ref 13.0–17.0)
Immature Granulocytes: 1 %
Lymphocytes Relative: 21 %
Lymphs Abs: 0.9 10*3/uL (ref 0.7–4.0)
MCH: 27 pg (ref 26.0–34.0)
MCHC: 30.3 g/dL (ref 30.0–36.0)
MCV: 89 fL (ref 80.0–100.0)
Monocytes Absolute: 0.4 10*3/uL (ref 0.1–1.0)
Monocytes Relative: 9 %
Neutro Abs: 2.6 10*3/uL (ref 1.7–7.7)
Neutrophils Relative %: 60 %
Platelet Count: 294 10*3/uL (ref 150–400)
RBC: 3.45 MIL/uL — ABNORMAL LOW (ref 4.22–5.81)
RDW: 19.9 % — ABNORMAL HIGH (ref 11.5–15.5)
WBC Count: 4.2 10*3/uL (ref 4.0–10.5)
nRBC: 0.5 % — ABNORMAL HIGH (ref 0.0–0.2)

## 2022-10-23 LAB — CMP (CANCER CENTER ONLY)
ALT: 16 U/L (ref 0–44)
AST: 16 U/L (ref 15–41)
Albumin: 3.1 g/dL — ABNORMAL LOW (ref 3.5–5.0)
Alkaline Phosphatase: 77 U/L (ref 38–126)
Anion gap: 6 (ref 5–15)
BUN: 20 mg/dL (ref 6–20)
CO2: 25 mmol/L (ref 22–32)
Calcium: 8.5 mg/dL — ABNORMAL LOW (ref 8.9–10.3)
Chloride: 106 mmol/L (ref 98–111)
Creatinine: 1.01 mg/dL (ref 0.61–1.24)
GFR, Estimated: >60 mL/min (ref >60)
Glucose, Bld: 143 mg/dL — ABNORMAL HIGH (ref 70–99)
Potassium: 3.9 mmol/L (ref 3.5–5.1)
Sodium: 137 mmol/L (ref 135–145)
Total Bilirubin: 0.6 mg/dL (ref 0.3–1.2)
Total Protein: 6.8 g/dL (ref 6.5–8.1)

## 2022-10-23 LAB — RETIC PANEL
Immature Retic Fract: 21.2 % — ABNORMAL HIGH (ref 2.3–15.9)
RBC.: 3.51 MIL/uL — ABNORMAL LOW (ref 4.22–5.81)
Retic Count, Absolute: 37.2 10*3/uL (ref 19.0–186.0)
Retic Ct Pct: 1.1 % (ref 0.4–3.1)
Reticulocyte Hemoglobin: 31.9 pg (ref >27.9)

## 2022-10-23 LAB — SAMPLE TO BLOOD BANK

## 2022-10-23 MED ORDER — SODIUM CHLORIDE 0.9% FLUSH
10.0000 mL | Freq: Once | INTRAVENOUS | Status: AC | PRN
  Administered 2022-10-23: 10 mL
  Filled 2022-10-23: qty 10

## 2022-10-23 MED ORDER — SODIUM CHLORIDE 0.9 % IV SOLN
Freq: Once | INTRAVENOUS | Status: AC
  Filled 2022-10-23: qty 250

## 2022-10-23 MED ORDER — FREESTYLE LIBRE 2 SENSOR MISC
11 refills | Status: DC
Start: 2022-10-23 — End: 2023-05-06

## 2022-10-23 MED ORDER — BUSPIRONE HCL 7.5 MG PO TABS
7.5000 mg | ORAL_TABLET | Freq: Two times a day (BID) | ORAL | 1 refills | Status: DC
Start: 2022-10-23 — End: 2023-01-29

## 2022-10-23 MED ORDER — SODIUM CHLORIDE 0.9 % IV SOLN
200.0000 mg | Freq: Once | INTRAVENOUS | Status: AC
  Administered 2022-10-23: 200 mg via INTRAVENOUS
  Filled 2022-10-23: qty 200

## 2022-10-23 MED ORDER — DAPAGLIFLOZIN PROPANEDIOL 10 MG PO TABS: ORAL_TABLET | ORAL | 0 refills | Status: DC

## 2022-10-23 MED ORDER — ATORVASTATIN CALCIUM 40 MG PO TABS
40.0000 mg | ORAL_TABLET | Freq: Every day | ORAL | 3 refills | Status: DC
Start: 2022-10-23 — End: 2023-05-06

## 2022-10-23 MED ORDER — HEPARIN SOD (PORK) LOCK FLUSH 100 UNIT/ML IV SOLN
500.0000 [IU] | Freq: Once | INTRAVENOUS | Status: AC | PRN
  Administered 2022-10-23: 500 [IU]
  Filled 2022-10-23: qty 5

## 2022-10-23 NOTE — Patient Instructions (Signed)
Iron Sucrose Injection What is this medication? IRON SUCROSE (EYE ern SOO krose) treats low levels of iron (iron deficiency anemia) in people with kidney disease. Iron is a mineral that plays an important role in making red blood cells, which carry oxygen from your lungs to the rest of your body. This medicine may be used for other purposes; ask your health care provider or pharmacist if you have questions. COMMON BRAND NAME(S): Venofer What should I tell my care team before I take this medication? They need to know if you have any of these conditions: Anemia not caused by low iron levels Heart disease High levels of iron in the blood Kidney disease Liver disease An unusual or allergic reaction to iron, other medications, foods, dyes, or preservatives Pregnant or trying to get pregnant Breastfeeding How should I use this medication? This medication is for infusion into a vein. It is given in a hospital or clinic setting. Talk to your care team about the use of this medication in children. While this medication may be prescribed for children as young as 2 years for selected conditions, precautions do apply. Overdosage: If you think you have taken too much of this medicine contact a poison control center or emergency room at once. NOTE: This medicine is only for you. Do not share this medicine with others. What if I miss a dose? Keep appointments for follow-up doses. It is important not to miss your dose. Call your care team if you are unable to keep an appointment. What may interact with this medication? Do not take this medication with any of the following: Deferoxamine Dimercaprol Other iron products This medication may also interact with the following: Chloramphenicol Deferasirox This list may not describe all possible interactions. Give your health care provider a list of all the medicines, herbs, non-prescription drugs, or dietary supplements you use. Also tell them if you smoke,  drink alcohol, or use illegal drugs. Some items may interact with your medicine. What should I watch for while using this medication? Visit your care team regularly. Tell your care team if your symptoms do not start to get better or if they get worse. You may need blood work done while you are taking this medication. You may need to follow a special diet. Talk to your care team. Foods that contain iron include: whole grains/cereals, dried fruits, beans, or peas, leafy green vegetables, and organ meats (liver, kidney). What side effects may I notice from receiving this medication? Side effects that you should report to your care team as soon as possible: Allergic reactions--skin rash, itching, hives, swelling of the face, lips, tongue, or throat Low blood pressure--dizziness, feeling faint or lightheaded, blurry vision Shortness of breath Side effects that usually do not require medical attention (report to your care team if they continue or are bothersome): Flushing Headache Joint pain Muscle pain Nausea Pain, redness, or irritation at injection site This list may not describe all possible side effects. Call your doctor for medical advice about side effects. You may report side effects to FDA at 1-800-FDA-1088. Where should I keep my medication? This medication is given in a hospital or clinic and will not be stored at home. NOTE: This sheet is a summary. It may not cover all possible information. If you have questions about this medicine, talk to your doctor, pharmacist, or health care provider.  2024 Elsevier/Gold Standard (2021-09-26 00:00:00)  

## 2022-10-23 NOTE — Telephone Encounter (Signed)
Requested medication (s) are due for refill today: Yes  Requested medication (s) are on the active medication list: Yes  Last refill:  Atorvastatin 11/06/21, Farxiga 08/07/22  Future visit scheduled: Yes  Notes to clinic:  Unable to refill per protocol, last refill by another provider.      Requested Prescriptions  Pending Prescriptions Disp Refills   atorvastatin (LIPITOR) 40 MG tablet 90 tablet 3    Sig: Take 1 tablet (40 mg total) by mouth daily at 6 PM.     Cardiovascular:  Antilipid - Statins Failed - 10/23/2022 11:02 AM      Failed - Lipid Panel in normal range within the last 12 months    Cholesterol, Total  Date Value Ref Range Status  05/19/2020 154 100 - 199 mg/dL Final   Cholesterol  Date Value Ref Range Status  09/27/2022 167 0 - 200 mg/dL Final   LDL Chol Calc (NIH)  Date Value Ref Range Status  05/19/2020 100 (H) 0 - 99 mg/dL Final   LDL Cholesterol  Date Value Ref Range Status  09/27/2022 115 (H) 0 - 99 mg/dL Final    Comment:           Total Cholesterol/HDL:CHD Risk Coronary Heart Disease Risk Table                     Men   Women  1/2 Average Risk   3.4   3.3  Average Risk       5.0   4.4  2 X Average Risk   9.6   7.1  3 X Average Risk  23.4   11.0        Use the calculated Patient Ratio above and the CHD Risk Table to determine the patient's CHD Risk.        ATP III CLASSIFICATION (LDL):  <100     mg/dL   Optimal  188-416  mg/dL   Near or Above                    Optimal  130-159  mg/dL   Borderline  606-301  mg/dL   High  >601     mg/dL   Very High Performed at [facility], [address] [place], [place] [address]    HDL  Date Value Ref Range Status  09/27/2022 24 (L) >40 mg/dL Final  55/73/2202 39 (L) >39 mg/dL Final   Triglycerides  Date Value Ref Range Status  09/27/2022 139 <150 mg/dL Final         Passed - Patient is not pregnant      Passed - Valid encounter within last 12 months    Recent Outpatient Visits            3 months ago Morbid obesity [facility])   [place] [facility] [place], [place], PA-C   5 months ago Hypotension due to drugs   [facility] [place], [place], PA-C   5 months ago Constipation, unspecified constipation type    [facility] [place], [place], PA-C   5 months ago Morbid obesity [facility])    [facility] [place], [place], PA-C   6 months ago Type 2 diabetes mellitus with hyperglycemia, without long-term current use of insulin [facility])    [facility] [place], [place], PA-C       Future Appointments             In 6 days  Jacky Kindle, FNP [facility], [facility]             dapagliflozin propanediol (FARXIGA) 10 MG TABS tablet 180 tablet 0    Sig: Take 1 tablet (10 mg) by mouth once daily/please contact office to schedule follow up prior to further refills.     Endocrinology:  Diabetes - SGLT2 Inhibitors Failed - 10/23/2022 11:02 AM      Failed - Cr in normal range and within 360 days    Creatinine  Date Value Ref Range Status  10/15/2022 1.30 (H) 0.61 - 1.24 mg/dL Final   Creatinine,U  Date Value Ref Range Status  01/18/2020 203.7 mg/dL Final         Passed - HBA1C is between 0 and 7.9 and within 180 days    HbA1c, POC (controlled diabetic range)  Date Value Ref Range Status  06/03/2018 7.0 0.0 - 7.0 % Final   Hgb A1c MFr Bld  Date Value Ref Range Status  09/27/2022 5.9 (H) 4.8 - 5.6 % Final    Comment:    (NOTE)         Prediabetes: 5.7 - 6.4         Diabetes: >6.4         Glycemic control for adults with diabetes: <7.0          Passed - eGFR in normal range and within 360 days    GFR calc Af Amer  Date Value Ref Range Status  05/19/2020 72 >59 mL/min/1.73 Final    Comment:    **In accordance with recommendations from the NKF-ASN Task force,**   [facility] is in the process of updating its eGFR calculation to the   2021  CKD-EPI creatinine equation that estimates kidney function   without a race variable.    GFR, Estimated  Date Value Ref Range Status  10/15/2022 >60 >60 mL/min Final    Comment:    (NOTE) Calculated using the CKD-EPI Creatinine Equation (2021)    GFR  Date Value Ref Range Status  01/18/2020 64.78 >60.00 mL/min Final   eGFR  Date Value Ref Range Status  05/23/2022 44 (L) >59 mL/min/1.73 Final         Passed - Valid encounter within last 6 months    Recent Outpatient Visits           3 months ago Morbid obesity [facility])   [place] [facility] [place], [place], PA-C   5 months ago Hypotension due to drugs   [facility] [place], [place], PA-C   5 months ago Constipation, unspecified constipation type   [facility] [facility] [place], [place], PA-C   5 months ago Morbid obesity [facility])   [place] [facility] [place], [place], PA-C   6 months ago Type 2 diabetes mellitus with hyperglycemia, without long-term current use of insulin (HCC)   [place] [facility] [place], [place], PA-C       Future Appointments             In 6 days Jacky Kindle, FNP  [facility], [facility]            Signed Prescriptions Disp Refills   busPIRone (BUSPAR) 7.5 MG tablet 180 tablet 1    Sig: Take 1 tablet (7.5 mg total) by mouth 2 (two) times daily.     Psychiatry: Anxiolytics/Hypnotics - Non-controlled Passed - 10/23/2022 11:02 AM      Passed -  Valid encounter within last 12 months    Recent Outpatient Visits           3 months ago Morbid obesity [facility])   [place] [facility] [place], [place], PA-C   5 months ago Hypotension due to drugs   [facility] [place], [place], PA-C   5 months ago Constipation, unspecified constipation type   [place] [facility] [place], [place], PA-C   5 months ago Morbid obesity [facility])   [facility] [place], [place], PA-C   6 months ago Type 2 diabetes mellitus with hyperglycemia, without long-term current use of insulin [facility])   [place] [facility] [place], [place], PA-C       Future Appointments             In 6 days Jacky Kindle, FNP [place] [facility], [facility]             Continuous Glucose Sensor (FREESTYLE LIBRE 2 SENSOR) MISC 2 each 11    Sig: Place 1 sensor on the skin every 14 days. Use to check glucose continuously     Endocrinology: Diabetes - Testing Supplies Passed - 10/23/2022 11:02 AM      Passed - Valid encounter within last 12 months    Recent Outpatient Visits           3 months ago Morbid obesity [facility])   [place] [facility] [place], [place], PA-C   5 months ago Hypotension due to drugs   [facility] [place], [place], PA-C   5 months ago Constipation, unspecified constipation type   [place] [place], [place], PA-C   5 months ago Morbid obesity [facility])   [place] [facility] [place], [place], PA-C   6 months ago Type 2 diabetes mellitus with hyperglycemia, without long-term current use of insulin [facility])   [place] [facility] [place], [place], PA-C       Future Appointments             In 6 days Jacky Kindle, FNP [facility], [facility]

## 2022-10-23 NOTE — Progress Notes (Signed)
Hematology/Oncology Progress note [phone] [phone]   REFERRING PROVIDER: Debera Lat, PA-C   CHIEF COMPLAINTS/PURPOSE OF CONSULTATION:  GE junction cancer/gastric cardia cancer.  ASSESSMENT & PLAN:   Cancer Staging  Gastric cancer [facility]) Staging form: Stomach, AJCC 8th Edition - Clinical stage from 09/19/2022: Stage IVB (cTX, cN2, cM1) - Signed by Rickard Patience, MD on 09/19/2022  HISTORY OF PRESENTING ILLNESS:  Jeremiah Vance 52 y.o. male presents to establish care for  I have reviewed his chart and materials related to his cancer extensively and collaborated history with the patient. Summary of oncologic history is as follows: Oncology History  Gastric cancer (HCC)  09/08/2022 Imaging   CT abdomen pelvis w contrast     09/08/2022 Imaging   CT abdomen pelvis with contrast  Diffuse masslike thickening in the area of the GE junction and upper stomach with the extensive abnormal lymph nodes throughout the abdomen including retrocrural, porta hepatis, retroperitoneum and lesser and greater curve of the stomach. In addition there are areas of suggest peritoneal carcinomatosis. Recommend further evaluation.    No bowel obstruction. Large midline anterior pelvic wall hernia involving small bowel and mesenteric fat with rectus muscle diastasis. Gallstones.   09/19/2022 Cancer Staging   Staging form: Stomach, AJCC 8th Edition - Clinical stage from 09/19/2022: Stage IVB (cTX, cN2, cM1) - Signed by Rickard Patience, MD on 09/19/2022 Stage prefix: Initial diagnosis   09/19/2022 Initial Diagnosis   Gastric cancer (HCC)  09/08/2022, patient presented emergency room for evaluation of abdominal pain and vomiting.  He has also had weight loss.  Dysphagia with solid food for couple of months.  CT abdomen pelvis showed diffuse masslike thickening of the GE junction and upper stomach with extensive abnormal lymph nodes throughout abdomen.  09/10/2022 upper endoscopy showed malignant  appearance gastric tumor at the GE junction and in the cardia Biopsied.  Nodular mucosa in the second portion of duodenum.  Biopsied. GE junction mass showed moderate to poorly differentiated adenocarcinoma undermining squamous mucosa with focal area of squamous atypia.  Cannot exclude adenosquamous carcinoma.  Intestinal metaplasia is not identified. Duodenum cold biopsy-reactive duodenitis.  Negative for dysplasia and malignancy. Stomach random biopsy negative for H. pylori, intestinal metaplasia, dysplasia and malignancy.  Stomach Cardia mass biopsy showed moderate to poorly differentiated adenocarcinoma.  Patient's case was discussed on tumor board on 09/19/2022.  Per Dr. Oneita Kras, nephrology findings of both GE junction mass and gastric cardia mass or similar. HER2 staining is pending. Molecular testing is pending.     09/23/2022 Imaging   PET scan showed 1. 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 2. Enlarged cirrhotic liver. 3. Cholelithiasis. 4. Bilateral renal stones. 5. Umbilical hernias contain unobstructed small bowel and/or fat.  6. Aortic atherosclerosis (ICD10-I70.0). Coronary artery calcification. 7. Enlarged pulmonic trunk, indicative of pulmonary arterial hypertension.    10/02/2022 -  Chemotherapy   Patient is on Treatment Plan : GASTROESOPHAGEAL FOLFOX q14d     He lives at home with his sister.  Patient is undergoing neuro physical therapy. 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 A fib previously on Eliquis, currently on hold.   INTERVAL HISTORY Jeremiah Vance is a 52 y.o. male who has above history reviewed by me today presents for follow up visit for stage IV GE junction/gastric cancer.  He is here with his wife.   Status post cycle 2 chemotherapy with monotherapy 5-FU.  He has tolerated this  fairly well thus far. He is also receiving IV Venofer  treatments which have improved his fatigue a decent amount. He has had treatments on 10/04/2022 and 10/10/2022 and is due for treatment today with possible blood tomorrow.   They deny any bleeding/bruising episodes. No new SOB. Has chronic peripheral edema for which he takes Torsemide- previously scheduled now as needed following his last hospitalization on 09/27/2022. He has not taken any since this time but is thinking about taking one today.   He continues to gain weight secondary to an improved appetite.  Wt Readings from Last 3 Encounters:  10/23/22 255 lb (115.7 kg)  10/15/22 251 lb (113.9 kg)  10/10/22 246 lb 12.8 oz (111.9 kg)    MEDICAL HISTORY:  Past Medical History:  Diagnosis Date   Acute ischemic stroke (HCC) 09/06/2017   Acute renal failure superimposed on stage 3a chronic kidney disease (HCC) 07/08/2019   Acute right-sided weakness    Allergies    Anasarca    Anemia 07/10/2017   Arthritis    CHF (congestive heart failure) (HCC)    "coinsided w/kidney problems I was having 06/2017"   Chicken pox    CKD (chronic kidney disease) stage 3, GFR 30-59 ml/min (HCC) 06/2017   Depression    Elevated troponin 07/08/2019   High cholesterol    History of cardiomyopathy    LVEF 40 to 45% in April 2019 - subsequently normalized   Hyperbilirubinemia 07/10/2017   Hypertension    Ischemic stroke (HCC)    Small left internal capsule infarct due to lacunar disease   Morbid obesity (HCC)    Normocytic anemia 12/16/2017   Recurrent incisional hernia with incarceration s/p repair 10/22/2017 10/21/2017   Stroke (HCC) 08/2017   "right sided weakness since; getting stronger though" (10/22/2017)   Type 2 diabetes mellitus (HCC)     SURGICAL HISTORY: Past Surgical History:  Procedure Laterality Date   ABDOMINAL HERNIA REPAIR  2008; 10/22/2017   "scope; OPEN REPAIR INCARCERATED VENTRAL HERNIA   ESOPHAGOGASTRODUODENOSCOPY (EGD) WITH PROPOFOL N/A 09/10/2022   Procedure: ESOPHAGOGASTRODUODENOSCOPY  (EGD) WITH PROPOFOL;  Surgeon: Toney Reil, MD;  Location: [facility] ENDOSCOPY;  Service: Gastroenterology;  Laterality: N/A;   ESOPHAGOGASTRODUODENOSCOPY (EGD) WITH PROPOFOL N/A 09/28/2022   Procedure: ESOPHAGOGASTRODUODENOSCOPY (EGD) WITH PROPOFOL;  Surgeon: Jaynie Collins, DO;  Location: [facility] ENDOSCOPY;  Service: Gastroenterology;  Laterality: N/A;   HEMOSTASIS CONTROL  09/28/2022   Procedure: HEMOSTASIS CONTROL;  Surgeon: Jaynie Collins, DO;  Location: [facility] ENDOSCOPY;  Service: Gastroenterology;;   HERNIA REPAIR     KNEE ARTHROSCOPY Right 1989   PORTA CATH INSERTION N/A 09/25/2022   Procedure: PORTA CATH INSERTION;  Surgeon: Annice Needy, MD;  Location: [facility] INVASIVE CV LAB;  Service: Cardiovascular;  Laterality: N/A;   VENTRAL HERNIA REPAIR N/A 10/22/2017   Procedure: OPEN REPAIR INCARCERATED VENTRAL HERNIA;  Surgeon: Glenna Fellows, MD;  Location: [facility] OR;  Service: General;  Laterality: N/A;    SOCIAL HISTORY: Social History   Socioeconomic History   Marital status: Divorced    Spouse name: Not on file   Number of children: Not on file   Years of education: Not on file   Highest education level: Not on file  Occupational History   Not on file  Tobacco Use   Smoking status: Never   Smokeless tobacco: Never  Vaping Use   Vaping status: Never Used  Substance and Sexual Activity   Alcohol use: Yes    Comment: occasional beer   Drug use:  Never   Sexual activity: Not Currently  Other Topics Concern   Not on file  Social History Narrative   He lives with his sister , [name] and she is his MPOA after his stokes   Social Determinants of Health   Financial Resource Strain: High Risk (03/20/2020)   Overall Financial Resource Strain (CARDIA)    Difficulty of Paying Living Expenses: Hard  Food Insecurity: No Food Insecurity (09/27/2022)   Hunger Vital Sign    Worried About Running Out of Food in the Last Year: Never true    Ran Out of Food in the Last Year:  Never true  Transportation Needs: Unmet Transportation Needs (09/27/2022)   PRAPARE - Administrator, Civil Service (Medical): No    Lack of Transportation (Non-Medical): Yes  Physical Activity: Not on file  Stress: Not on file  Social Connections: Not on file  Intimate Partner Violence: Not At Risk (09/27/2022)   Humiliation, Afraid, Rape, and Kick questionnaire    Fear of Current or Ex-Partner: No    Emotionally Abused: No    Physically Abused: No    Sexually Abused: No    FAMILY HISTORY: Family History  Problem Relation Age of Onset   Diabetes Mother    Stroke Mother    Arthritis Mother    Depression Mother    Heart disease Mother    Hypertension Mother    Learning disabilities Mother    Mental illness Mother    Sleep apnea Mother    Diabetes Father    Heart disease Father    Arthritis Father    Hearing loss Father    Hyperlipidemia Father    Heart attack Father    Hypertension Father    Stroke Father    Diabetes Sister    Depression Sister    Diabetes Sister    Hypertension Sister    Mental illness Sister    Sleep apnea Sister    Diabetes Maternal Grandmother    Heart disease Maternal Grandmother    Depression Maternal Grandmother    Hyperlipidemia Maternal Grandmother    Hypertension Maternal Grandmother    Stroke Maternal Grandmother    Hypertension Maternal Grandfather    Hyperlipidemia Maternal Grandfather    Stroke Maternal Grandfather    Arthritis Paternal Grandmother    Hearing loss Paternal Grandmother    Stroke Paternal Grandfather    Heart disease Paternal Grandfather    Arthritis Paternal Grandfather    Heart attack Paternal Grandfather    Melanoma Other     ALLERGIES:  is allergic to pollen extract and sulfa antibiotics.  MEDICATIONS:  Current Outpatient Medications  Medication Sig Dispense Refill   atorvastatin (LIPITOR) 40 MG tablet Take 1 tablet (40 mg total) by mouth daily at 6 PM. 90 tablet 3   busPIRone (BUSPAR) 7.5 MG  tablet Take 1 tablet (7.5 mg total) by mouth 2 (two) times daily. 180 tablet 1   Continuous Glucose Sensor (FREESTYLE LIBRE 2 SENSOR) MISC Place 1 sensor on the skin every 14 days. Use to check glucose continuously 2 each 11   dapagliflozin propanediol (FARXIGA) 10 MG TABS tablet Take 1 tablet (10 mg) by mouth once daily/please contact office to schedule follow up prior to further refills. 180 tablet 0   dexamethasone (DECADRON) 4 MG tablet Take 2 tablets (8 mg total) by mouth daily. Start the day after chemotherapy for 2 days. Take with food. 30 tablet 1   hydrALAZINE (APRESOLINE) 50 MG tablet Take 1 tablet (50  mg total) by mouth in the morning and at bedtime. please contact office to schedule follow up prior to further refills. 180 tablet 0   ondansetron (ZOFRAN-ODT) 8 MG disintegrating tablet Take 1 tablet (8 mg total) by mouth every 8 (eight) hours as needed for nausea or vomiting. 60 tablet 1   pantoprazole (PROTONIX) 40 MG tablet Take 1 tablet (40 mg total) by mouth 2 (two) times daily before a meal. 60 tablet 0   prochlorperazine (COMPAZINE) 10 MG tablet Take 1 tablet (10 mg total) by mouth every 6 (six) hours as needed for nausea or vomiting. 60 tablet 0   scopolamine (TRANSDERM-SCOP) 1 MG/3DAYS Place 1 patch (1.5 mg total) onto the skin every 3 (three) days. 4 patch 0   torsemide (DEMADEX) 20 MG tablet Take 2 tablets (40 mg total) by mouth daily as needed (fluid/edema). 90 tablet 3   No current facility-administered medications for this visit.    Review of Systems  Constitutional:  Positive for fatigue. Negative for appetite change, chills, fever and unexpected weight change.  HENT:   Negative for hearing loss and voice change.   Eyes:  Negative for eye problems and icterus.  Respiratory:  Negative for chest tightness, cough and shortness of breath.   Cardiovascular:  Negative for chest pain and leg swelling.  Gastrointestinal:  Negative for abdominal distention, abdominal pain and  nausea.  Endocrine: Negative for hot flashes.  Genitourinary:  Negative for difficulty urinating, dysuria and frequency.   Musculoskeletal:  Negative for arthralgias.  Skin:  Negative for itching and rash.  Neurological:  Negative for light-headedness and numbness.  Hematological:  Negative for adenopathy. Does not bruise/bleed easily.  Psychiatric/Behavioral:  Negative for confusion.     PHYSICAL EXAMINATION: ECOG PERFORMANCE STATUS: 2 - Symptomatic, <50% confined to bed  Vitals:   10/23/22 1257  BP: (!) 141/82  Pulse: 65  Resp: 18  Temp: (!) 97.2 F (36.2 C)  SpO2: 99%    Filed Weights   10/23/22 1308  Weight: 255 lb (115.7 kg)     Physical Exam Constitutional:      General: He is not in acute distress.    Appearance: He is obese. He is not diaphoretic.  HENT:     Head: Normocephalic and atraumatic.  Eyes:     General: No scleral icterus.    Pupils: Pupils are equal, round, and reactive to light.  Cardiovascular:     Rate and Rhythm: Normal rate.  Pulmonary:     Effort: Pulmonary effort is normal. No respiratory distress.  Abdominal:     General: There is no distension.     Palpations: Abdomen is soft.     Tenderness: There is no abdominal tenderness.  Musculoskeletal:        General: Normal range of motion.     Cervical back: Normal range of motion and neck supple.     Right lower leg: Edema (1+) present.     Left lower leg: Edema (1+) present.  Skin:    General: Skin is warm and dry.  Neurological:     Mental Status: He is alert and oriented to person, place, and time. Mental status is at baseline.     Cranial Nerves: No cranial nerve deficit.     Motor: No abnormal muscle tone.  Psychiatric:        Mood and Affect: Mood and affect normal.      LABORATORY DATA:  I have reviewed the data as listed    Latest  Ref Rng & Units 10/15/2022    8:33 AM 10/10/2022    2:18 PM 10/02/2022    8:09 AM  CBC  WBC 4.0 - 10.5 K/uL 4.5  8.6  5.9   Hemoglobin 13.0 -  17.0 g/dL 9.0  9.2  8.9   Hematocrit 39.0 - 52.0 % 29.6  30.1  28.3   Platelets 150 - 400 K/uL 167  297  211       Latest Ref Rng & Units 10/15/2022    8:33 AM 10/10/2022    2:18 PM 10/02/2022    8:09 AM  CMP  Glucose 70 - 99 mg/dL 409  811  914   BUN 6 - 20 mg/dL 26  28  29    Creatinine 0.61 - 1.24 mg/dL 7.82  9.56  2.13   Sodium 135 - 145 mmol/L 140  136  134   Potassium 3.5 - 5.1 mmol/L 3.8  4.0  4.0   Chloride 98 - 111 mmol/L 111  106  101   CO2 22 - 32 mmol/L 21  22  24    Calcium 8.9 - 10.3 mg/dL 8.6  8.5  8.6   Total Protein 6.5 - 8.1 g/dL 6.9  7.0  7.2   Total Bilirubin 0.3 - 1.2 mg/dL 0.4  0.9  0.8   Alkaline Phos 38 - 126 U/L 85  72  72   AST 15 - 41 U/L 18  28  34   ALT 0 - 44 U/L 15  29  10       Assessment and Plan  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  NGS molecular studies are pending.  May add immunotherapy if he is eligible per Dr. Cathie Hoops No change in treatment plan as of today     Other cirrhosis of liver (HCC) LFT is stable. No change to plan today   Paroxysmal atrial fibrillation (HCC) Hold Eliquis due to GI bleeding/anemia No change to plan today   IDA (iron deficiency anemia) Proceed with Venofer treatment today. Hgb is steadily improving- 9.3 today Continue weekly IV Venofer x 5   Weight loss Seen by nutrition. Continue nutrition supplements No change to plan today   Anxiety associated with cancer diagnosis (HCC) Ativan 0.5 mg every 12 hours as needed for anxiety/sleep/nausea Chronic and not discussed as a concern at our visit today  Peripheral Edema Chronic in nature.  Continue Torsemide PRN- today would be ideal.  Port-A-Cath In place and accessed today  Disposition: Venofer today 1 weeks lab MD FOLFOX as planned    All questions were answered. The patient knows to call the clinic with any problems, questions or concerns.  Clent Jacks  [facility] Hematology Oncology 10/23/2022

## 2022-10-23 NOTE — Telephone Encounter (Signed)
Medication Refill - Medication: atorvastatin (LIPITOR) 40 MG tablet busPIRone (BUSPAR) 7.5 MG tablet dapagliflozin propanediol (FARXIGA) 10 MG TABS tablet  Continuous Blood Gluc Sensor (FREESTYLE LIBRE 2 SENSOR) MISC   Pt had an appt scheduled today 10/23/22 but provider is ooo today and the rest of the week. Pt sister rescheduled his appt for 10/29/2022 and is wanting to see if his medication can be called in for him. Pt has been out of medication for 3 days.     Has the patient contacted their pharmacy? No.   Preferred Pharmacy (with phone number or street name):  [id number] [place], [place] - [address] AT [facility] OF [address]  Phone: [phone] Fax: [phone]  Has the patient been seen for an appointment in the last year OR does the patient have an upcoming appointment? Yes.    Agent: Please be advised that RX refills may take up to 3 business days. We ask that you follow-up with your pharmacy.

## 2022-10-23 NOTE — Telephone Encounter (Signed)
Received a fax from covermymeds for Farxiga 10mg   Key: [id number]

## 2022-10-23 NOTE — Telephone Encounter (Signed)
Requested Prescriptions  Pending Prescriptions Disp Refills   atorvastatin (LIPITOR) 40 MG tablet 90 tablet 3    Sig: Take 1 tablet (40 mg total) by mouth daily at 6 PM.     Cardiovascular:  Antilipid - Statins Failed - 10/23/2022 11:02 AM      Failed - Lipid Panel in normal range within the last 12 months    Cholesterol, Total  Date Value Ref Range Status  05/19/2020 154 100 - 199 mg/dL Final   Cholesterol  Date Value Ref Range Status  09/27/2022 167 0 - 200 mg/dL Final   LDL Chol Calc (NIH)  Date Value Ref Range Status  05/19/2020 100 (H) 0 - 99 mg/dL Final   LDL Cholesterol  Date Value Ref Range Status  09/27/2022 115 (H) 0 - 99 mg/dL Final    Comment:           Total Cholesterol/HDL:CHD Risk Coronary Heart Disease Risk Table                     Men   Women  1/2 Average Risk   3.4   3.3  Average Risk       5.0   4.4  2 X Average Risk   9.6   7.1  3 X Average Risk  23.4   11.0        Use the calculated Patient Ratio above and the CHD Risk Table to determine the patient's CHD Risk.        ATP III CLASSIFICATION (LDL):  <100     mg/dL   Optimal  147-829  mg/dL   Near or Above                    Optimal  130-159  mg/dL   Borderline  562-130  mg/dL   High  >865     mg/dL   Very High Performed at [facility], [address] [place], [place] [address]    HDL  Date Value Ref Range Status  09/27/2022 24 (L) >40 mg/dL Final  62/95/2841 39 (L) >39 mg/dL Final   Triglycerides  Date Value Ref Range Status  09/27/2022 139 <150 mg/dL Final         Passed - Patient is not pregnant      Passed - Valid encounter within last 12 months    Recent Outpatient Visits           3 months ago Morbid obesity [facility])   [place] [facility] [place], [place], PA-C   5 months ago Hypotension due to drugs   [facility] [place], [place], PA-C   5 months ago Constipation, unspecified constipation type   [place] [facility] [place], [place], PA-C   5 months ago Morbid obesity [facility])   [place] [facility] [place], [place], PA-C   6 months ago Type 2 diabetes mellitus with hyperglycemia, without long-term current use of insulin (HCC)   [place] [facility] [place], [place], PA-C       Future Appointments             In 6 days Jacky Kindle, FNP  [facility], [facility]             busPIRone (BUSPAR) 7.5 MG tablet 180 tablet 1    Sig: Take 1 tablet (7.5 mg total) by mouth 2 (two) times daily.     Psychiatry: Anxiolytics/Hypnotics -  Non-controlled Passed - 10/23/2022 11:02 AM      Passed - Valid encounter within last 12 months    Recent Outpatient Visits           3 months ago Morbid obesity [facility])   [place] [facility] [place], [place], PA-C   5 months ago Hypotension due to drugs   [place] [facility] [place], [place], PA-C   5 months ago Constipation, unspecified constipation type   [place] [facility] [place], [place], PA-C   5 months ago Morbid obesity [facility])   [place] [facility] [place], [place], PA-C   6 months ago Type 2 diabetes mellitus with hyperglycemia, without long-term current use of insulin [facility])   [place] [facility] [place], [place], PA-C       Future Appointments             In 6 days Jacky Kindle, FNP [facility], [facility]             Continuous Glucose Sensor (FREESTYLE LIBRE 2 SENSOR) MISC 2 each 11    Sig: Place 1 sensor on the skin every 14 days. Use to check glucose continuously     Endocrinology: Diabetes - Testing Supplies Passed - 10/23/2022 11:02 AM      Passed - Valid encounter within last 12 months    Recent Outpatient Visits           3 months ago Morbid obesity [facility])   [place] [facility] [place], [place], PA-C   5 months ago Hypotension due to drugs   [facility] [place], [place], PA-C   5 months ago Constipation, unspecified constipation type   [facility] [place], [place], PA-C   5 months ago Morbid obesity [facility])   [place] [facility] [place], [place], PA-C   6 months ago Type 2 diabetes mellitus with hyperglycemia, without long-term current use of insulin (HCC)   [place] [facility] [place], [place], PA-C       Future Appointments             In 6 days Jacky Kindle, FNP [place] [facility], [facility]             dapagliflozin propanediol (FARXIGA) 10 MG TABS tablet 180 tablet 0    Sig: Take 1 tablet (10 mg) by mouth once daily/please contact office to schedule follow up prior to further refills.     Endocrinology:  Diabetes - SGLT2 Inhibitors Failed - 10/23/2022 11:02 AM      Failed - Cr in normal range and within 360 days    Creatinine  Date Value Ref Range Status  10/15/2022 1.30 (H) 0.61 - 1.24 mg/dL Final   Creatinine,U  Date Value Ref Range Status  01/18/2020 203.7 mg/dL Final         Passed - HBA1C is between 0 and 7.9 and within 180 days    HbA1c, POC (controlled diabetic range)  Date Value Ref Range Status  06/03/2018 7.0 0.0 - 7.0 % Final   Hgb A1c MFr Bld  Date Value Ref Range Status  09/27/2022 5.9 (H) 4.8 - 5.6 % Final    Comment:    (NOTE)         Prediabetes: 5.7 - 6.4         Diabetes: >6.4         Glycemic control for adults with  diabetes: <7.0          Passed - eGFR in normal range and within 360 days    GFR calc Af Amer  Date Value Ref Range Status  05/19/2020 72 >59 mL/min/1.73 Final    Comment:    **In accordance with recommendations from the NKF-ASN Task force,**   [facility] is in the process of updating its eGFR calculation to the   2021 CKD-EPI creatinine equation that estimates kidney function   without a race variable.    GFR, Estimated  Date Value Ref Range Status  10/15/2022 >60  >60 mL/min Final    Comment:    (NOTE) Calculated using the CKD-EPI Creatinine Equation (2021)    GFR  Date Value Ref Range Status  01/18/2020 64.78 >60.00 mL/min Final   eGFR  Date Value Ref Range Status  05/23/2022 44 (L) >59 mL/min/1.73 Final         Passed - Valid encounter within last 6 months    Recent Outpatient Visits           3 months ago Morbid obesity [facility])   [facility] [place], [place], PA-C   5 months ago Hypotension due to drugs   [facility] [place], [place], PA-C   5 months ago Constipation, unspecified constipation type   [place] [facility] [place], [place], PA-C   5 months ago Morbid obesity [facility])   [place] [facility] [place], [place], PA-C   6 months ago Type 2 diabetes mellitus with hyperglycemia, without long-term current use of insulin [facility])   [place] [facility] [place], [place], PA-C       Future Appointments             In 6 days Jacky Kindle, FNP [facility], [facility]

## 2022-10-27 ENCOUNTER — Emergency Department
Admission: EM | Admit: 2022-10-27 | Discharge: 2022-10-27 | Disposition: A | Discharge status: Home / Self Care | Payer: Medicaid Other | Source: Home / Self Care | Attending: Emergency Medicine | Admitting: Emergency Medicine

## 2022-10-27 ENCOUNTER — Other Ambulatory Visit: Payer: Self-pay

## 2022-10-27 ENCOUNTER — Emergency Department: Payer: Medicaid Other

## 2022-10-27 DIAGNOSIS — E119 Type 2 diabetes mellitus without complications: Secondary | ICD-10-CM | POA: Insufficient documentation

## 2022-10-27 DIAGNOSIS — A419 Sepsis, unspecified organism: Secondary | ICD-10-CM | POA: Diagnosis not present

## 2022-10-27 DIAGNOSIS — Z85028 Personal history of other malignant neoplasm of stomach: Secondary | ICD-10-CM | POA: Insufficient documentation

## 2022-10-27 DIAGNOSIS — G629 Polyneuropathy, unspecified: Secondary | ICD-10-CM | POA: Diagnosis not present

## 2022-10-27 DIAGNOSIS — M79672 Pain in left foot: Secondary | ICD-10-CM | POA: Diagnosis not present

## 2022-10-27 HISTORY — DX: Malignant (primary) neoplasm, unspecified: C80.1

## 2022-10-27 LAB — CBC WITH DIFFERENTIAL/PLATELET
Abs Immature Granulocytes: 0.04 10*3/uL (ref 0.00–0.07)
Basophils Absolute: 0 10*3/uL (ref 0.0–0.1)
Basophils Relative: 0 %
Eosinophils Absolute: 0.1 10*3/uL (ref 0.0–0.5)
Eosinophils Relative: 1 %
HCT: 33.2 % — ABNORMAL LOW (ref 39.0–52.0)
Hemoglobin: 10.5 g/dL — ABNORMAL LOW (ref 13.0–17.0)
Immature Granulocytes: 1 %
Lymphocytes Relative: 10 %
Lymphs Abs: 0.8 10*3/uL (ref 0.7–4.0)
MCH: 27.2 pg (ref 26.0–34.0)
MCHC: 31.6 g/dL (ref 30.0–36.0)
MCV: 86 fL (ref 80.0–100.0)
Monocytes Absolute: 1.1 10*3/uL — ABNORMAL HIGH (ref 0.1–1.0)
Monocytes Relative: 14 %
Neutro Abs: 6.2 10*3/uL (ref 1.7–7.7)
Neutrophils Relative %: 74 %
Platelets: 283 10*3/uL (ref 150–400)
RBC: 3.86 MIL/uL — ABNORMAL LOW (ref 4.22–5.81)
RDW: 19.2 % — ABNORMAL HIGH (ref 11.5–15.5)
WBC: 8.3 10*3/uL (ref 4.0–10.5)
nRBC: 0 % (ref 0.0–0.2)

## 2022-10-27 LAB — COMPREHENSIVE METABOLIC PANEL
ALT: 13 U/L (ref 0–44)
AST: 18 U/L (ref 15–41)
Albumin: 3.7 g/dL (ref 3.5–5.0)
Alkaline Phosphatase: 79 U/L (ref 38–126)
Anion gap: 15 (ref 5–15)
BUN: 47 mg/dL — ABNORMAL HIGH (ref 6–20)
CO2: 25 mmol/L (ref 22–32)
Calcium: 8.7 mg/dL — ABNORMAL LOW (ref 8.9–10.3)
Chloride: 99 mmol/L (ref 98–111)
Creatinine, Ser: 1.79 mg/dL — ABNORMAL HIGH (ref 0.61–1.24)
GFR, Estimated: 45 mL/min — ABNORMAL LOW (ref >60)
Glucose, Bld: 172 mg/dL — ABNORMAL HIGH (ref 70–99)
Potassium: 3.4 mmol/L — ABNORMAL LOW (ref 3.5–5.1)
Sodium: 139 mmol/L (ref 135–145)
Total Bilirubin: 1.1 mg/dL (ref 0.3–1.2)
Total Protein: 8.3 g/dL — ABNORMAL HIGH (ref 6.5–8.1)

## 2022-10-27 LAB — LACTIC ACID, PLASMA: Lactic Acid, Venous: 1.8 mmol/L (ref 0.5–1.9)

## 2022-10-27 MED ORDER — MORPHINE SULFATE (PF) 4 MG/ML IV SOLN
4.0000 mg | Freq: Once | INTRAVENOUS | Status: AC
  Administered 2022-10-27: 4 mg via INTRAVENOUS
  Filled 2022-10-27: qty 1

## 2022-10-27 MED ORDER — SODIUM CHLORIDE 0.9 % IV BOLUS
500.0000 mL | Freq: Once | INTRAVENOUS | Status: AC
  Administered 2022-10-27: 500 mL via INTRAVENOUS

## 2022-10-27 MED ORDER — ONDANSETRON HCL 4 MG/2ML IJ SOLN
4.0000 mg | Freq: Once | INTRAMUSCULAR | Status: AC
  Administered 2022-10-27: 4 mg via INTRAVENOUS
  Filled 2022-10-27: qty 2

## 2022-10-27 NOTE — ED Triage Notes (Signed)
Pt to ED POV for L foot pain, worse since 2 days ago, ongoing since 2 months. Both feet swollen. Pt has stomach and esophageal cancer stage 4. Has port to L chest. Pt appears pale. Last chemo 2 weeks ago.

## 2022-10-27 NOTE — ED Provider Notes (Signed)
[facility] Provider Note    Event Date/Time   First MD Initiated Contact with Patient 10/27/22 1250     (approximate)   History   Foot Pain   HPI  Jeremiah Vance is a 52 y.o. male with a history of diabetes, gastric cancer who presents with complaints of pain in his left foot.  Patient reports this started over the last 1 to 2 days.  His last chemotherapy was 2 weeks ago, FOLFOX regimen, this was the second cycle.  No fevers or chills.  No trauma.     Physical Exam   Triage Vital Signs: ED Triage Vitals  Encounter Vitals Group     BP 10/27/22 1258 (!) 155/132     Systolic BP Percentile --      Diastolic BP Percentile --      Pulse Rate 10/27/22 1258 (!) 108     Resp 10/27/22 1258 (!) 24     Temp 10/27/22 1258 98.1 F (36.7 C)     Temp Source 10/27/22 1258 Oral     SpO2 10/27/22 1258 99 %     Weight 10/27/22 1256 116 kg (255 lb 11.7 oz)     Height 10/27/22 1256 1.651 m (5\' 5" )     Head Circumference --      Peak Flow --      Pain Score 10/27/22 1255 8     Pain Loc --      Pain Education --      Exclude from Growth Chart --     Most recent vital signs: Vitals:   10/27/22 1330 10/27/22 1400  BP: (!) 140/78 136/78  Pulse: 96 94  Resp: (!) 27 (!) 33  Temp:    SpO2: 98% 97%     General: Awake, no distress.  CV:  Good peripheral perfusion.  Resp:  Normal effort.  Abd:  No distention.  Other:  Left foot: No redness no swelling no bony normalities, no bruising.  Warm and well-perfused.  Tenderness over the forefoot both dorsally and plantar surface   ED Results / Procedures / Treatments   Labs (all labs ordered are listed, but only abnormal results are displayed) Labs Reviewed  COMPREHENSIVE METABOLIC PANEL - Abnormal; Notable for the following components:      Result Value   Potassium 3.4 (*)    Glucose, Bld 172 (*)    BUN 47 (*)    Creatinine, Ser 1.79 (*)    Calcium 8.7 (*)    Total Protein 8.3 (*)    GFR, Estimated 45  (*)    All other components within normal limits  CBC WITH DIFFERENTIAL/PLATELET - Abnormal; Notable for the following components:   RBC 3.86 (*)    Hemoglobin 10.5 (*)    HCT 33.2 (*)    RDW 19.2 (*)    Monocytes Absolute 1.1 (*)    All other components within normal limits  CULTURE, BLOOD (ROUTINE X 2)  CULTURE, BLOOD (ROUTINE X 2)  LACTIC ACID, PLASMA     EKG     RADIOLOGY Chest x-ray viewed interpreted me, no acute abnormality    PROCEDURES:  Critical Care performed:   Procedures   MEDICATIONS ORDERED IN ED: Medications  sodium chloride 0.9 % bolus 500 mL (0 mLs Intravenous Stopped 10/27/22 1456)  morphine (PF) 4 MG/ML injection 4 mg (4 mg Intravenous Given 10/27/22 1411)  ondansetron (ZOFRAN) injection 4 mg (4 mg Intravenous Given 10/27/22 1410)     IMPRESSION /  MDM / ASSESSMENT AND PLAN / ED COURSE  I reviewed the triage vital signs and the nursing notes. Patient's presentation is most consistent with acute illness / injury with system symptoms.  Patient presents with left foot pain as detailed above, differential includes trauma, ischemia, neuropathy, infection  Exam is overall reassuring, warm and well-perfused distally, good cap refill.  No evidence of infection, no erythema, no fluctuance.  No bony abnormalities palpated, foot x-ray is unremarkable  Suspect neuropathy given FOLFOX regimen, patient feeling improved after analgesics IV morphine, IV Zofran.  Has close follow-up with his oncologist in 2 days, has analgesics at home.  Appropriate discharge at this time, no indication for admission.        FINAL CLINICAL IMPRESSION(S) / ED DIAGNOSES   Final diagnoses:  Neuropathy     Rx / DC Orders   ED Discharge Orders     None        Note:  This document was prepared using Dragon voice recognition software and may include unintentional dictation errors.   Jene Every, MD 10/27/22 [id number]

## 2022-10-28 ENCOUNTER — Ambulatory Visit: Payer: Medicaid Other | Admitting: Physical Therapy

## 2022-10-28 ENCOUNTER — Ambulatory Visit: Payer: Medicaid Other

## 2022-10-28 MED ORDER — SODIUM CHLORIDE 0.9 % IV SOLN
2400.0000 mg/m2 | INTRAVENOUS | Status: DC
  Filled 2022-10-28: qty 111

## 2022-10-28 MED FILL — Iron Sucrose Inj 20 MG/ML (Fe Equiv): INTRAVENOUS | Qty: 10 | Status: AC

## 2022-10-28 MED FILL — Dexamethasone Sodium Phosphate Inj 100 MG/10ML: INTRAMUSCULAR | Qty: 1 | Status: AC

## 2022-10-28 NOTE — Therapy (Deleted)
OUTPATIENT PHYSICAL THERAPY NEURO TREATMENT   Patient Name: Jeremiah Vance MRN: [id number] DOB:05-08-1970, 52 y.o., male Today's Date: 10/28/2022   PCP: Debera Lat PA REFERRING PROVIDER: Debera Lat PA  END OF SESSION:      Past Medical History:  Diagnosis Date   Acute ischemic stroke (HCC) 09/06/2017   Acute renal failure superimposed on stage 3a chronic kidney disease (HCC) 07/08/2019   Acute right-sided weakness    Allergies    Anasarca    Anemia 07/10/2017   Arthritis    Cancer (HCC)    stomach and esophageal cancer stage 4   CHF (congestive heart failure) (HCC)    "coinsided w/kidney problems I was having 06/2017"   Chicken pox    CKD (chronic kidney disease) stage 3, GFR 30-59 ml/min (HCC) 06/2017   Depression    Elevated troponin 07/08/2019   High cholesterol    History of cardiomyopathy    LVEF 40 to 45% in April 2019 - subsequently normalized   Hyperbilirubinemia 07/10/2017   Hypertension    Ischemic stroke (HCC)    Small left internal capsule infarct due to lacunar disease   Morbid obesity (HCC)    Normocytic anemia 12/16/2017   Recurrent incisional hernia with incarceration s/p repair 10/22/2017 10/21/2017   Stroke (HCC) 08/2017   "right sided weakness since; getting stronger though" (10/22/2017)   Type 2 diabetes mellitus (HCC)    Past Surgical History:  Procedure Laterality Date   ABDOMINAL HERNIA REPAIR  2008; 10/22/2017   "scope; OPEN REPAIR INCARCERATED VENTRAL HERNIA   ESOPHAGOGASTRODUODENOSCOPY (EGD) WITH PROPOFOL N/A 09/10/2022   Procedure: ESOPHAGOGASTRODUODENOSCOPY (EGD) WITH PROPOFOL;  Surgeon: Toney Reil, MD;  Location: [facility] ENDOSCOPY;  Service: Gastroenterology;  Laterality: N/A;   ESOPHAGOGASTRODUODENOSCOPY (EGD) WITH PROPOFOL N/A 09/28/2022   Procedure: ESOPHAGOGASTRODUODENOSCOPY (EGD) WITH PROPOFOL;  Surgeon: Jaynie Collins, DO;  Location: [facility] ENDOSCOPY;  Service: Gastroenterology;  Laterality: N/A;   HEMOSTASIS  CONTROL  09/28/2022   Procedure: HEMOSTASIS CONTROL;  Surgeon: Jaynie Collins, DO;  Location: [facility] ENDOSCOPY;  Service: Gastroenterology;;   HERNIA REPAIR     KNEE ARTHROSCOPY Right 1989   PORTA CATH INSERTION N/A 09/25/2022   Procedure: PORTA CATH INSERTION;  Surgeon: Annice Needy, MD;  Location: [facility] INVASIVE CV LAB;  Service: Cardiovascular;  Laterality: N/A;   VENTRAL HERNIA REPAIR N/A 10/22/2017   Procedure: OPEN REPAIR INCARCERATED VENTRAL HERNIA;  Surgeon: Glenna Fellows, MD;  Location: [facility] OR;  Service: General;  Laterality: N/A;   Patient Active Problem List   Diagnosis Date Noted   Encounter for antineoplastic chemotherapy 10/02/2022   GI bleed 09/28/2022   Symptomatic anemia 09/27/2022   HTN (hypertension) 09/27/2022   Chronic combined systolic and diastolic congestive heart failure (HCC) 09/27/2022   Chronic kidney disease, stage 3b (HCC) 09/27/2022   Myocardial injury 09/27/2022   Iron deficiency anemia 09/27/2022   Type II diabetes mellitus with renal manifestations (HCC) 09/27/2022   Atrial fibrillation, chronic (HCC) 09/27/2022   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   Goals of care, counseling/discussion 09/19/2022   Weight loss 09/19/2022   Nausea without vomiting 09/19/2022   Anxiety associated with cancer diagnosis (HCC) 09/19/2022   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   IDA (iron deficiency anemia) 09/09/2022   Gastric mass 09/09/2022   Gastrointestinal hemorrhage 09/09/2022   Paroxysmal atrial fibrillation (HCC) 09/08/2022   Abdominal pain 09/08/2022   Anxiety and depression 07/26/2022   Dysarthria 05/20/2022   Need for diphtheria-tetanus-pertussis (Tdap) vaccine 03/20/2022   Type 2 diabetes  mellitus without complication, with long-term current use of insulin (HCC) 01/16/2022   Dyslipidemia 01/16/2022   Chronic diastolic CHF (congestive heart failure) (HCC) 01/16/2022   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     Intracranial hemorrhage (HCC)    HFrEF (heart failure with reduced ejection fraction) (HCC)    Obesity, Class III, BMI 40-49.9 (morbid obesity) (HCC) 05/22/2020   Other cirrhosis of liver (HCC)    Ventral hernia without obstruction or gangrene    Shortness of breath    Neck pain    Prostate cancer screening 01/21/2020   Lymphedema of both lower extremities 10/17/2019   History of stroke 10/17/2019   CKD (chronic kidney disease) stage 3, GFR 30-59 ml/min (HCC) 08/31/2019   Swelling of limb 08/31/2019   DM (diabetes mellitus), type 2 with complications (HCC) 07/08/2019   Vitreous floaters of both eyes 06/03/2018   Moderate obstructive sleep apnea 02/11/2018   Chronic fatigue 12/16/2017   Essential hypertension 12/16/2017   History of small bowel obstruction 10/24/2017   Adjustment disorder with depressed mood 09/30/2017   Stroke (HCC) 09/14/2017   Hyperlipidemia    Gout    Elevated LFTs    Heart failure with preserved ejection fraction (HCC)    Morbid obesity (HCC)    Hypoalbuminemia 07/11/2017    ONSET DATE: 05/20/22  REFERRING DIAG: Left sided weakness   THERAPY DIAG:  No diagnosis found.  Rationale for Evaluation and Treatment: Rehabilitation  SUBJECTIVE:                                                                                                                                                                                             SUBJECTIVE STATEMENT:  ***    Pt accompanied by: self  PERTINENT HISTORY:   Patient presents for Left sided weakness. Patient presented to Ed on 05/20/22 for L sided weakness and slurred speech.MRI negative.  PMH includes CVA (2019, 2023), obesity, Type II DM, HTN, HLD, lymphedema of BLE, Stage IIIa CKD, anemia, depression, hernia,arthritis, CHF. Patient had multiple strokes in  fall 2023 and received home health. 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 Patient has not worked the past two weeks due to another incident 2-3 weeks  ago. Prior to stroke was independent, use a walker occasionally now.   PAIN:  Are you having pain? Yes: NPRS scale: 8/10 Pain location: R lower leg Pain description: aching Aggravating factors: Fall  Relieving factors: sitting down, keeping legs raised, hot shower  PRECAUTIONS: Fall  WEIGHT BEARING RESTRICTIONS: No  FALLS: Has patient fallen in last 6 months? Yes. Number of falls 1  LIVING ENVIRONMENT: Lives with: lives with their family Lives in: House/apartment Stairs: Yes: Internal: flight steps; on right going up Has following equipment at home: Dan Humphreys - 2 wheeled  PLOF: Independent  PATIENT GOALS: to get stronger  OBJECTIVE:   DIAGNOSTIC FINDINGS:   MRI: IMPRESSION: 1. No evidence of acute abnormality. 2. Prior hemorrhages, prior lacunar infarcts, and prior chronic microvascular ischemic disease which is advanced for patient age. Findings could be in part secondary to chronic hypertension.  CTA:   1. No emergent large vessel occlusion. 2. 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 3. Multifocal irregularity and moderate narrowing of the right vertebral artery, mildly progressed.  COGNITION: Overall cognitive status:  short term memory issues s/p stroke.    SENSATION: Light touch: Impaired LLE  COORDINATION: LLE coordination impaired with delay and limited coordination.   EDEMA:  Bilateral lymphedema of LE's   POSTURE: rounded shoulders, forward head, and flexed trunk    LOWER EXTREMITY MMT:    MMT Right Eval Left Eval  Hip flexion 3 3  Hip extension    Hip abduction 4- 3+  Hip adduction 4- 3+  Hip internal rotation    Hip external rotation    Knee flexion 3+ 3  Knee extension 3+ 3  Ankle dorsiflexion 4- 3-  Ankle plantarflexion 4- 3+  Ankle inversion    Ankle eversion    (Blank rows = not tested)   TRANSFERS: Assistive device utilized: None  Sit to stand: CGA Stand to sit: CGA Chair to chair: CGA    GAIT: Gait  pattern: step through pattern, decreased stride length, antalgic, wide BOS, poor foot clearance- Right, and poor foot clearance- Left Distance walked: 40 ft Assistive device utilized: None Level of assistance: CGA Comments: Patient is unsteady without AD, has antalgic gait pattern with R knee however has limited strength for weightbearing on LLE.   FUNCTIONAL TESTS:  5 times sit to stand: 41 seconds  10 meter walk test: 21 seconds increased knee pain 6/10 Berg Balance Scale: 14/56  PATIENT SURVEYS:  FOTO 44  TODAY'S TREATMENT: DATE: 10/28/22   Neuro Re-ed:  -ball toss 15x standing rainbow ball with CGA and SPT; cues for coordination and stabilization with pertubation -standing soccer ball kicks in // bars for pertubation stabilization, BUE support x 3 minutes   TherEx:  Seated LAQ's, 2x10 each LE Seated marches, 2x10 each LE Seated hamstring isometric press into dyandisc, 5 sec holds, 2x10 each LE Seated dynadisc dorsiflexion/plantarflexion, 2x10 each direction Seated hip adduction into rainbow physioball, 5 sec holds, 2x10   High knee marching in hallway with walker, pt fatigued quickly, but able to complete full length of hallway Lateral stepping along hallway with UE support on wall, 20' x2 each direction Pt required to sit and rest following the lateral stepping  STS x10 with rest break after 5  Ambulation with walker to OT office         PATIENT EDUCATION: Education details: goals, POC, HEP Person educated: Patient Education method: Explanation, Demonstration, Tactile cues, and Verbal cues Education comprehension: verbalized understanding, returned demonstration, verbal cues required, and tactile cues required  HOME EXERCISE PROGRAM: Access Code: [email or web address] URL: [email or web address] Date: 05/30/2022 Prepared by: Precious Bard  Exercises - Leg Extension  - 1 x daily - 7 x weekly - 2 sets - 10 reps - 5 hold - Seated March  - 1 x daily - 7 x  weekly - 2 sets - 10 reps - 5 hold - Seated Ankle Circles  -  1 x daily - 7 x weekly - 2 sets - 10 reps - 5 hold  GOALS: Goals reviewed with patient? Yes  SHORT TERM GOALS: Target date: 06/27/2022    Patient will be independent in home exercise program to improve strength/mobility for better functional independence with ADLs. Baseline: 2/29: HEP given 4/24: completing 3-4 times per week with no issues  Goal status: MET   LONG TERM GOALS: Target date: 11/12/2022      Patient will increase FOTO score to equal to or greater than  54%   to demonstrate statistically significant improvement in mobility and quality of life.  Baseline: 2/29: 44% 4/24:55% Goal status: MET  2.  Patient (< 28 years old) will complete five times sit to stand test in < 10 seconds indicating an increased LE strength and improved balance. Baseline: 2/29: 41 seconds with significant UE support 07/24/22:19.38 R UE support only 5/21: 21 seconds without UE support Goal status: IN PROGRESS  3.  Patient will increase Berg Balance score by > 8 points (44/56) to demonstrate decreased fall risk during functional activities. Baseline: 2/29: 14/56 4/24: 36/56 5/21: 39 Goal status: Partially Met  4.  Patient will increase 10 meter walk test to >1.70m/s as to improve gait speed for better community ambulation and to reduce fall risk. Baseline: 2/29: 21 seconds increased knee pain 4/24:20 sec 5/21: 15 seconds  Goal status: Partially Met     ASSESSMENT:  CLINICAL IMPRESSION:  Pt very fatigued with most exercises during session.  Pt performed little upright activities and required frequent seated rest breaks in order to continue with therapy.  Pt demonstrating decreased functional mobility during session today due to the weakness from hospital admission.  Pt will continue to regain function and mobility with therapy progression.   Pt will continue to benefit from skilled therapy to address remaining deficits in order to  improve overall QoL and return to PLOF.       OBJECTIVE IMPAIRMENTS: Abnormal gait, cardiopulmonary status limiting activity, decreased activity tolerance, decreased balance, decreased coordination, decreased endurance, decreased mobility, difficulty walking, decreased strength, increased edema, impaired perceived functional ability, impaired flexibility, impaired sensation, impaired UE functional use, impaired vision/preception, improper body mechanics, postural dysfunction, obesity, and pain.   ACTIVITY LIMITATIONS: carrying, lifting, bending, sitting, standing, squatting, stairs, transfers, bed mobility, bathing, toileting, dressing, reach over head, hygiene/grooming, locomotion level, and caring for others  PARTICIPATION LIMITATIONS: meal prep, cleaning, laundry, personal finances, driving, shopping, community activity, occupation, yard work, school, and church  PERSONAL FACTORS: Age, Behavior pattern, Education, Financial risk analyst, Past/current experiences, Profession, Social background, Time since onset of injury/illness/exacerbation, Transportation, and 3+ comorbidities: CVA (2019, 2023), obesity, Type II DM, HTN, HLD, lymphedema of BLE, Stage IIIa CKD, anemia, depression, hernia,arthritis, CHF  are also affecting patient's functional outcome.   REHAB POTENTIAL: Good  CLINICAL DECISION MAKING: Evolving/moderate complexity  EVALUATION COMPLEXITY: Moderate  PLAN:  PT FREQUENCY: 2x/week  PT DURATION: 12 weeks  PLANNED INTERVENTIONS: Therapeutic exercises, Therapeutic activity, Neuromuscular re-education, Balance training, Gait training, Patient/Family education, Self Care, Joint mobilization, Joint manipulation, Stair training, Vestibular training, Canalith repositioning, Visual/preceptual remediation/compensation, Orthotic/Fit training, DME instructions, Cognitive remediation, Electrical stimulation, Spinal mobilization, Cryotherapy, Manual lymph drainage, Compression bandaging, Taping,  Vasopneumatic device, Traction, Ultrasound, Ionotophoresis 4mg /ml Dexamethasone, Manual therapy, and Re-evaluation  PLAN FOR NEXT SESSION: balance, strength, ambulatory endurance training    Norman Herrlich PT ,DPT Physical Therapist- [facility]  [facility]   10/28/22, 8:41 AM

## 2022-10-29 ENCOUNTER — Encounter: Payer: Self-pay | Admitting: Family Medicine

## 2022-10-29 ENCOUNTER — Ambulatory Visit: Payer: Medicaid Other

## 2022-10-29 ENCOUNTER — Inpatient Hospital Stay: Payer: Medicaid Other

## 2022-10-29 ENCOUNTER — Ambulatory Visit
Admission: RE | Admit: 2022-10-29 | Discharge: 2022-10-29 | Disposition: A | Discharge status: Home / Self Care | Payer: Medicaid Other | Source: Ambulatory Visit | Attending: Oncology | Admitting: Oncology

## 2022-10-29 ENCOUNTER — Ambulatory Visit: Payer: Medicaid Other | Admitting: Family Medicine

## 2022-10-29 ENCOUNTER — Inpatient Hospital Stay (HOSPITAL_BASED_OUTPATIENT_CLINIC_OR_DEPARTMENT_OTHER): Payer: Medicaid Other | Admitting: Oncology

## 2022-10-29 ENCOUNTER — Encounter: Payer: Self-pay | Admitting: Oncology

## 2022-10-29 VITALS — BP 137/86 | HR 78 | Temp 96.0°F | Resp 18 | Wt 235.8 lb

## 2022-10-29 VITALS — BP 137/80 | HR 98 | Ht 70.0 in | Wt 235.6 lb

## 2022-10-29 DIAGNOSIS — K7469 Other cirrhosis of liver: Secondary | ICD-10-CM | POA: Diagnosis not present

## 2022-10-29 DIAGNOSIS — C801 Malignant (primary) neoplasm, unspecified: Secondary | ICD-10-CM

## 2022-10-29 DIAGNOSIS — M79605 Pain in left leg: Secondary | ICD-10-CM | POA: Insufficient documentation

## 2022-10-29 DIAGNOSIS — C160 Malignant neoplasm of cardia: Secondary | ICD-10-CM

## 2022-10-29 DIAGNOSIS — D500 Iron deficiency anemia secondary to blood loss (chronic): Secondary | ICD-10-CM

## 2022-10-29 DIAGNOSIS — F39 Unspecified mood [affective] disorder: Secondary | ICD-10-CM | POA: Insufficient documentation

## 2022-10-29 DIAGNOSIS — N179 Acute kidney failure, unspecified: Secondary | ICD-10-CM | POA: Diagnosis not present

## 2022-10-29 DIAGNOSIS — M7989 Other specified soft tissue disorders: Secondary | ICD-10-CM | POA: Diagnosis not present

## 2022-10-29 DIAGNOSIS — F411 Generalized anxiety disorder: Secondary | ICD-10-CM

## 2022-10-29 DIAGNOSIS — I480 Paroxysmal atrial fibrillation: Secondary | ICD-10-CM

## 2022-10-29 DIAGNOSIS — E1165 Type 2 diabetes mellitus with hyperglycemia: Secondary | ICD-10-CM | POA: Insufficient documentation

## 2022-10-29 DIAGNOSIS — Z5111 Encounter for antineoplastic chemotherapy: Secondary | ICD-10-CM | POA: Diagnosis not present

## 2022-10-29 DIAGNOSIS — G620 Drug-induced polyneuropathy: Secondary | ICD-10-CM | POA: Insufficient documentation

## 2022-10-29 LAB — CBC WITH DIFFERENTIAL (CANCER CENTER ONLY)
Abs Immature Granulocytes: 0.02 10*3/uL (ref 0.00–0.07)
Basophils Absolute: 0 10*3/uL (ref 0.0–0.1)
Basophils Relative: 1 %
Eosinophils Absolute: 0.2 10*3/uL (ref 0.0–0.5)
Eosinophils Relative: 3 %
HCT: 31.1 % — ABNORMAL LOW (ref 39.0–52.0)
Hemoglobin: 9.8 g/dL — ABNORMAL LOW (ref 13.0–17.0)
Immature Granulocytes: 0 %
Lymphocytes Relative: 11 %
Lymphs Abs: 0.7 10*3/uL (ref 0.7–4.0)
MCH: 27.3 pg (ref 26.0–34.0)
MCHC: 31.5 g/dL (ref 30.0–36.0)
MCV: 86.6 fL (ref 80.0–100.0)
Monocytes Absolute: 0.7 10*3/uL (ref 0.1–1.0)
Monocytes Relative: 10 %
Neutro Abs: 5.3 10*3/uL (ref 1.7–7.7)
Neutrophils Relative %: 75 %
Platelet Count: 267 10*3/uL (ref 150–400)
RBC: 3.59 MIL/uL — ABNORMAL LOW (ref 4.22–5.81)
RDW: 18.7 % — ABNORMAL HIGH (ref 11.5–15.5)
WBC Count: 7 10*3/uL (ref 4.0–10.5)
nRBC: 0 % (ref 0.0–0.2)

## 2022-10-29 LAB — CMP (CANCER CENTER ONLY)
ALT: 12 U/L (ref 0–44)
AST: 18 U/L (ref 15–41)
Albumin: 3.1 g/dL — ABNORMAL LOW (ref 3.5–5.0)
Alkaline Phosphatase: 69 U/L (ref 38–126)
Anion gap: 9 (ref 5–15)
BUN: 47 mg/dL — ABNORMAL HIGH (ref 6–20)
CO2: 25 mmol/L (ref 22–32)
Calcium: 8.3 mg/dL — ABNORMAL LOW (ref 8.9–10.3)
Chloride: 103 mmol/L (ref 98–111)
Creatinine: 1.36 mg/dL — ABNORMAL HIGH (ref 0.61–1.24)
GFR, Estimated: >60 mL/min (ref >60)
Glucose, Bld: 162 mg/dL — ABNORMAL HIGH (ref 70–99)
Potassium: 3.5 mmol/L (ref 3.5–5.1)
Sodium: 137 mmol/L (ref 135–145)
Total Bilirubin: 0.6 mg/dL (ref 0.3–1.2)
Total Protein: 7.3 g/dL (ref 6.5–8.1)

## 2022-10-29 MED ORDER — SODIUM CHLORIDE 0.9 % IV SOLN
200.0000 mg | Freq: Once | INTRAVENOUS | Status: AC
  Administered 2022-10-29: 200 mg via INTRAVENOUS
  Filled 2022-10-29: qty 200

## 2022-10-29 MED ORDER — LEUCOVORIN CALCIUM INJECTION 350 MG
400.0000 mg/m2 | Freq: Once | INTRAVENOUS | Status: AC
  Administered 2022-10-29: 888 mg via INTRAVENOUS
  Filled 2022-10-29: qty 44.4

## 2022-10-29 MED ORDER — DAPAGLIFLOZIN PROPANEDIOL 10 MG PO TABS: 10.0000 mg | ORAL_TABLET | Freq: Every day | ORAL | 3 refills | Status: DC

## 2022-10-29 MED ORDER — PANTOPRAZOLE SODIUM 40 MG PO TBEC: 40.0000 mg | DELAYED_RELEASE_TABLET | Freq: Two times a day (BID) | ORAL | 3 refills | Status: DC

## 2022-10-29 MED ORDER — SODIUM CHLORIDE 0.9 % IV SOLN
2400.0000 mg/m2 | INTRAVENOUS | Status: DC
  Administered 2022-10-29: 5000 mg via INTRAVENOUS
  Filled 2022-10-29: qty 100

## 2022-10-29 MED ORDER — GABAPENTIN 100 MG PO CAPS: 100.0000 mg | ORAL_CAPSULE | Freq: Two times a day (BID) | ORAL | 0 refills | Status: DC

## 2022-10-29 MED ORDER — SERTRALINE HCL 50 MG PO TABS: 50.0000 mg | ORAL_TABLET | Freq: Every day | ORAL | 3 refills | Status: DC

## 2022-10-29 MED ORDER — SODIUM CHLORIDE 0.9 % IV SOLN
Freq: Once | INTRAVENOUS | Status: AC
  Filled 2022-10-29: qty 250

## 2022-10-29 MED ORDER — PALONOSETRON HCL INJECTION 0.25 MG/5ML
0.2500 mg | Freq: Once | INTRAVENOUS | Status: AC
  Administered 2022-10-29: 0.25 mg via INTRAVENOUS
  Filled 2022-10-29: qty 5

## 2022-10-29 MED ORDER — DEXTROSE 5 % IV SOLN
Freq: Once | INTRAVENOUS | Status: AC
  Filled 2022-10-29: qty 250

## 2022-10-29 MED ORDER — SODIUM CHLORIDE 0.9 % IV SOLN
10.0000 mg | Freq: Once | INTRAVENOUS | Status: AC
  Administered 2022-10-29: 10 mg via INTRAVENOUS
  Filled 2022-10-29: qty 10

## 2022-10-29 MED ORDER — OXALIPLATIN CHEMO INJECTION 100 MG/20ML
65.0000 mg/m2 | Freq: Once | INTRAVENOUS | Status: AC
  Administered 2022-10-29: 150 mg via INTRAVENOUS
  Filled 2022-10-29: qty 10

## 2022-10-29 NOTE — Assessment & Plan Note (Signed)
Chronic, stable Reports BG have been running high Also has been out of [place]; samples provided of similar agent Medication resent at 90 day vs 180 day given ongoing insurance auth

## 2022-10-29 NOTE — Assessment & Plan Note (Signed)
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  NGS molecular studies are pending.  May add immunotherapy if he is eligible. Labs are reviewed and discussed with patient. Proceed with 5-FU +Oxaliplatin 65mg /m2

## 2022-10-29 NOTE — Patient Instructions (Signed)
[place] [facility]  Discharge Instructions: Thank you for choosing [facility] to provide your oncology and hematology care.  If you have a lab appointment with the Cancer Center, please go directly to the Cancer Center and check in at the registration area.  Wear comfortable clothing and clothing appropriate for easy access to any Portacath or PICC line.   We strive to give you quality time with your provider. You may need to reschedule your appointment if you arrive late (15 or more minutes).  Arriving late affects you and other patients whose appointments are after yours.  Also, if you miss three or more appointments without notifying the office, you may be dismissed from the clinic at the provider's discretion.      For prescription refill requests, have your pharmacy contact our office and allow 72 hours for refills to be completed.    Today you received the following chemotherapy and/or immunotherapy agents 5FU, oxaliplatin, leucovorin, venofer      To help prevent nausea and vomiting after your treatment, we encourage you to take your nausea medication as directed.  BELOW ARE SYMPTOMS THAT SHOULD BE REPORTED IMMEDIATELY: *FEVER GREATER THAN 100.4 F (38 C) OR HIGHER *CHILLS OR SWEATING *NAUSEA AND VOMITING THAT IS NOT CONTROLLED WITH YOUR NAUSEA MEDICATION *UNUSUAL SHORTNESS OF BREATH *UNUSUAL BRUISING OR BLEEDING *URINARY PROBLEMS (pain or burning when urinating, or frequent urination) *BOWEL PROBLEMS (unusual diarrhea, constipation, pain near the anus) TENDERNESS IN MOUTH AND THROAT WITH OR WITHOUT PRESENCE OF ULCERS (sore throat, sores in mouth, or a toothache) UNUSUAL RASH, SWELLING OR PAIN  UNUSUAL VAGINAL DISCHARGE OR ITCHING   Items with * indicate a potential emergency and should be followed up as soon as possible or go to the Emergency Department if any problems should occur.  Please show the CHEMOTHERAPY ALERT CARD or  IMMUNOTHERAPY ALERT CARD at check-in to the Emergency Department and triage nurse.  Should you have questions after your visit or need to cancel or reschedule your appointment, please contact [place] [facility]  [phone] and follow the prompts.  Office hours are 8:00 a.m. to 4:30 p.m. Monday - Friday. Please note that voicemails left after 4:00 p.m. may not be returned until the following business day.  We are closed weekends and major holidays. You have access to a nurse at all times for urgent questions. Please call the main number to the clinic [phone] and follow the prompts.  For any non-urgent questions, you may also contact your provider using MyChart. We now offer e-Visits for anyone 14 and older to request care online for non-urgent symptoms. For details visit [email or web address].   Also download the MyChart app! Go to the app store, search "MyChart", open the app, select [facility], and log in with your MyChart username and password.

## 2022-10-29 NOTE — Assessment & Plan Note (Addendum)
Differential includes neuropathy, DVT, arthritis etc. Check venous US of LLE to rule out DVT.-Negative I will start patient on gabapentin for possible neuropathy.

## 2022-10-29 NOTE — Progress Notes (Signed)
MD Cathie Hoops would like to reduce dose of 5FU to 5 g   Demetrius Charity, PharmD

## 2022-10-29 NOTE — Assessment & Plan Note (Signed)
Recommend Ativan 0.5 mg every 12 hours as needed for anxiety/sleep/nausea 

## 2022-10-29 NOTE — Assessment & Plan Note (Signed)
Hold Eliquis due to GI bleeding/anemia 

## 2022-10-29 NOTE — Assessment & Plan Note (Signed)
LFT is stable.

## 2022-10-29 NOTE — Assessment & Plan Note (Signed)
eGFR improved; continue to monitor PO intake and output Known risk factor includes current chemotherapy regimen  F/b multiple providers

## 2022-10-29 NOTE — Progress Notes (Signed)
Hematology/Oncology Progress note [phone] [phone]        REFERRING PROVIDER: Rickard Patience, MD    CHIEF COMPLAINTS/PURPOSE OF CONSULTATION:  GE junction cancer/gastric cardia cancer.  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  NGS molecular studies are pending.  May add immunotherapy if he is eligible. Labs are reviewed and discussed with patient. Proceed with 5-FU +Oxaliplatin 65mg /m2     Other cirrhosis of liver (HCC) LFT is stable.   IDA (iron deficiency anemia) Proceed with Venofer treatment today.   Anxiety associated with cancer diagnosis (HCC) Recommend Ativan 0.5 mg every 12 hours as needed for anxiety/sleep/nausea  Paroxysmal atrial fibrillation (HCC) Hold Eliquis due to GI bleeding/anemia  Acute pain of left lower extremity Differential includes neuropathy, DVT, arthritis etc. Check venous US of LLE to rule out DVT.-Negative I will start patient on gabapentin for possible neuropathy.   Orders Placed This Encounter  Procedures   US Venous Img Lower Unilateral Left    Standing Status:   Future    Number of Occurrences:   1    Standing Expiration Date:   10/29/2023    Order Specific Question:   Reason for Exam (SYMPTOM  OR DIAGNOSIS REQUIRED)    Answer:   swelling and tenderness    Order Specific Question:   Preferred imaging location?    Answer:   [facility]    2 weeks lab MD FOLFOX  All questions were answered. The patient knows to call the clinic with any problems, questions or concerns.  Rickard Patience, MD, PhD [facility] Hematology Oncology 10/29/2022    HISTORY OF PRESENTING ILLNESS:  Jeremiah Vance 52 y.o. male presents to establish  care for GE junction/gastric cardia adenocarcinoma I have reviewed his chart and materials related to his cancer extensively and collaborated history with the patient. Summary of oncologic history is as follows: Oncology History  Gastric cancer (HCC)  09/08/2022 Imaging   CT abdomen pelvis w contrast     09/08/2022 Imaging   CT abdomen pelvis with contrast  Diffuse masslike thickening in the area of the GE junction and upper stomach with the extensive abnormal lymph nodes throughout the abdomen including retrocrural, porta hepatis, retroperitoneum and lesser and greater curve of the stomach. In addition there are areas of suggest peritoneal carcinomatosis. Recommend further evaluation.    No bowel obstruction. Large midline anterior pelvic wall hernia involving small bowel and mesenteric fat with rectus muscle diastasis. Gallstones.   09/19/2022 Cancer Staging   Staging form: Stomach, AJCC 8th Edition - Clinical stage from 09/19/2022: Stage IVB (cTX, cN2, cM1) - Signed by Rickard Patience, MD on 09/19/2022 Stage prefix: Initial diagnosis   09/19/2022 Initial Diagnosis   Gastric cancer (HCC)  09/08/2022, patient presented emergency room for evaluation of abdominal pain and vomiting.  He has also had weight loss.  Dysphagia with solid food for couple of months.  CT abdomen pelvis showed diffuse masslike thickening of the GE junction and upper stomach with extensive abnormal lymph nodes throughout abdomen.  09/10/2022 upper endoscopy showed malignant appearance gastric tumor at the GE junction and in the cardia Biopsied.  Nodular mucosa in the second portion of duodenum.  Biopsied. GE junction mass showed moderate to poorly  differentiated adenocarcinoma undermining squamous mucosa with focal area of squamous atypia.  Cannot exclude adenosquamous carcinoma.  Intestinal metaplasia is not identified. Duodenum cold biopsy-reactive duodenitis.  Negative for dysplasia and malignancy. Stomach random biopsy negative  for H. pylori, intestinal metaplasia, dysplasia and malignancy.  Stomach Cardia mass biopsy showed moderate to poorly differentiated adenocarcinoma.  Patient's case was discussed on tumor board on 09/19/2022.  Per Dr. Oneita Kras, nephrology findings of both GE junction mass and gastric cardia mass or similar. HER2 staining is pending. Molecular testing is pending.     09/23/2022 Imaging   PET scan showed 1. 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 2. Enlarged cirrhotic liver. 3. Cholelithiasis. 4. Bilateral renal stones. 5. Umbilical hernias contain unobstructed small bowel and/or fat.  6. Aortic atherosclerosis (ICD10-I70.0). Coronary artery calcification. 7. Enlarged pulmonic trunk, indicative of pulmonary arterial hypertension.    10/02/2022 -  Chemotherapy   Patient is on Treatment Plan : GASTROESOPHAGEAL FOLFOX q14d     He lives at home with his sister.  Patient is undergoing neuro physical therapy. 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 A fib previously on Eliquis, currently on hold.  INTERVAL HISTORY [name] is a 52 y.o. male who has above history reviewed by me today presents for follow up visit for stage IV GE junction/gastric cancer.   Overall he tolerates chemotherapy treatments.  Denies any nausea vomiting diarrhea. He has noticed left lower extremity throbbing pain, he went to emergency room for evaluation. There is no evidence of infection, erythema or fluctuance.  Patient was discharged from ER and today he came for further evaluation. Pain is intermittent, throbbing in nature, located at the bottom of his feet, worse during ambulation. He takes torsemide daily as needed. He has lost 20 pounds since last week, he attributes partially to increased use of torsemide.  He has increased urination due to use of diuretics.  MEDICAL HISTORY:  Past Medical  History:  Diagnosis Date   Acute ischemic stroke (HCC) 09/06/2017   Acute renal failure superimposed on stage 3a chronic kidney disease (HCC) 07/08/2019   Acute right-sided weakness    Allergies    Anasarca    Anemia 07/10/2017   Arthritis    Cancer (HCC)    stomach and esophageal cancer stage 4   CHF (congestive heart failure) (HCC)    "coinsided w/kidney problems I was having 06/2017"   Chicken pox    CKD (chronic kidney disease) stage 3, GFR 30-59 ml/min (HCC) 06/2017   Depression    Elevated troponin 07/08/2019   High cholesterol    History of cardiomyopathy    LVEF 40 to 45% in April 2019 - subsequently normalized   Hyperbilirubinemia 07/10/2017   Hypertension    Ischemic stroke (HCC)    Small left internal capsule infarct due to lacunar disease   Morbid obesity (HCC)    Normocytic anemia 12/16/2017   Recurrent incisional hernia with incarceration s/p repair 10/22/2017 10/21/2017   Stroke (HCC) 08/2017   "right sided weakness since; getting stronger though" (10/22/2017)   Type 2 diabetes mellitus (HCC)     SURGICAL HISTORY: Past Surgical History:  Procedure Laterality Date   ABDOMINAL HERNIA REPAIR  2008; 10/22/2017   "scope; OPEN REPAIR INCARCERATED VENTRAL HERNIA   ESOPHAGOGASTRODUODENOSCOPY (EGD) WITH PROPOFOL N/A 09/10/2022   Procedure: ESOPHAGOGASTRODUODENOSCOPY (EGD) WITH PROPOFOL;  Surgeon: Toney Reil, MD;  Location: [facility] ENDOSCOPY;  Service: Gastroenterology;  Laterality:  N/A;   ESOPHAGOGASTRODUODENOSCOPY (EGD) WITH PROPOFOL N/A 09/28/2022   Procedure: ESOPHAGOGASTRODUODENOSCOPY (EGD) WITH PROPOFOL;  Surgeon: Jaynie Collins, DO;  Location: [facility] ENDOSCOPY;  Service: Gastroenterology;  Laterality: N/A;   HEMOSTASIS CONTROL  09/28/2022   Procedure: HEMOSTASIS CONTROL;  Surgeon: Jaynie Collins, DO;  Location[facility] ENDOSCOPY;  Service: Gastroenterology;;   HERNIA REPAIR     KNEE ARTHROSCOPY Right 1989   PORTA CATH INSERTION N/A 09/25/2022    Procedure: PORTA CATH INSERTION;  Surgeon: Annice Needy, MD;  Location: [facility] INVASIVE CV LAB;  Service: Cardiovascular;  Laterality: N/A;   VENTRAL HERNIA REPAIR N/A 10/22/2017   Procedure: OPEN REPAIR INCARCERATED VENTRAL HERNIA;  Surgeon: Glenna Fellows, MD;  Location: [facility] OR;  Service: General;  Laterality: N/A;    SOCIAL HISTORY: Social History   Socioeconomic History   Marital status: Divorced    Spouse name: Not on file   Number of children: Not on file   Years of education: Not on file   Highest education level: Not on file  Occupational History   Not on file  Tobacco Use   Smoking status: Never   Smokeless tobacco: Never  Vaping Use   Vaping status: Never Used  Substance and Sexual Activity   Alcohol use: Yes    Comment: occasional beer   Drug use: Never   Sexual activity: Not Currently  Other Topics Concern   Not on file  Social History Narrative   He lives with his sister , [name] and she is his MPOA after his stokes   Social Determinants of Health   Financial Resource Strain: High Risk (03/20/2020)   Overall Financial Resource Strain (CARDIA)    Difficulty of Paying Living Expenses: Hard  Food Insecurity: No Food Insecurity (09/27/2022)   Hunger Vital Sign    Worried About Running Out of Food in the Last Year: Never true    Ran Out of Food in the Last Year: Never true  Transportation Needs: Unmet Transportation Needs (09/27/2022)   PRAPARE - Administrator, Civil Service (Medical): No    Lack of Transportation (Non-Medical): Yes  Physical Activity: Not on file  Stress: Not on file  Social Connections: Not on file  Intimate Partner Violence: Not At Risk (09/27/2022)   Humiliation, Afraid, Rape, and Kick questionnaire    Fear of Current or Ex-Partner: No    Emotionally Abused: No    Physically Abused: No    Sexually Abused: No    FAMILY HISTORY: Family History  Problem Relation Age of Onset   Diabetes Mother    Stroke Mother     Arthritis Mother    Depression Mother    Heart disease Mother    Hypertension Mother    Learning disabilities Mother    Mental illness Mother    Sleep apnea Mother    Diabetes Father    Heart disease Father    Arthritis Father    Hearing loss Father    Hyperlipidemia Father    Heart attack Father    Hypertension Father    Stroke Father    Diabetes Sister    Depression Sister    Diabetes Sister    Hypertension Sister    Mental illness Sister    Sleep apnea Sister    Diabetes Maternal Grandmother    Heart disease Maternal Grandmother    Depression Maternal Grandmother    Hyperlipidemia Maternal Grandmother    Hypertension Maternal Grandmother    Stroke Maternal Grandmother  Hypertension Maternal Grandfather    Hyperlipidemia Maternal Grandfather    Stroke Maternal Grandfather    Arthritis Paternal Grandmother    Hearing loss Paternal Grandmother    Stroke Paternal Grandfather    Heart disease Paternal Grandfather    Arthritis Paternal Grandfather    Heart attack Paternal Grandfather    Melanoma Other     ALLERGIES:  is allergic to pollen extract and sulfa antibiotics.  MEDICATIONS:  Current Outpatient Medications  Medication Sig Dispense Refill   atorvastatin (LIPITOR) 40 MG tablet Take 1 tablet (40 mg total) by mouth daily at 6 PM. 90 tablet 3   busPIRone (BUSPAR) 7.5 MG tablet Take 1 tablet (7.5 mg total) by mouth 2 (two) times daily. 180 tablet 1   Continuous Glucose Sensor (FREESTYLE LIBRE 2 SENSOR) MISC Place 1 sensor on the skin every 14 days. Use to check glucose continuously 2 each 11   dexamethasone (DECADRON) 4 MG tablet Take 2 tablets (8 mg total) by mouth daily. Start the day after chemotherapy for 2 days. Take with food. 30 tablet 1   hydrALAZINE (APRESOLINE) 50 MG tablet Take 1 tablet (50 mg total) by mouth in the morning and at bedtime. please contact office to schedule follow up prior to further refills. (Patient not taking: Reported on 10/29/2022) 180  tablet 0   ondansetron (ZOFRAN) 8 MG tablet Take 8 mg by mouth every 8 (eight) hours as needed for nausea or vomiting.     ondansetron (ZOFRAN-ODT) 8 MG disintegrating tablet Take 1 tablet (8 mg total) by mouth every 8 (eight) hours as needed for nausea or vomiting. 60 tablet 1   oxyCODONE (OXY IR/ROXICODONE) 5 MG immediate release tablet Take 5 mg by mouth every 4 (four) hours as needed for severe pain.     prochlorperazine (COMPAZINE) 10 MG tablet Take 1 tablet (10 mg total) by mouth every 6 (six) hours as needed for nausea or vomiting. 60 tablet 0   scopolamine (TRANSDERM-SCOP) 1 MG/3DAYS Place 1 patch (1.5 mg total) onto the skin every 3 (three) days. 4 patch 0   torsemide (DEMADEX) 20 MG tablet Take 2 tablets (40 mg total) by mouth daily as needed (fluid/edema). 90 tablet 3   dapagliflozin propanediol (FARXIGA) 10 MG TABS tablet Take 1 tablet (10 mg total) by mouth daily. Take 1 tablet (10 mg) by mouth once daily/please contact office to schedule follow up prior to further refills. 90 tablet 3   pantoprazole (PROTONIX) 40 MG tablet Take 1 tablet (40 mg total) by mouth 2 (two) times daily before a meal. 60 tablet 3   sertraline (ZOLOFT) 50 MG tablet Take 1 tablet (50 mg total) by mouth daily. 30 tablet 3   No current facility-administered medications for this visit.    Review of Systems  Constitutional:  Positive for fatigue and unexpected weight change. Negative for appetite change, chills and fever.  HENT:   Negative for hearing loss and voice change.   Eyes:  Negative for eye problems and icterus.  Respiratory:  Negative for chest tightness, cough and shortness of breath.   Cardiovascular:  Negative for chest pain and leg swelling.  Gastrointestinal:  Negative for abdominal distention, abdominal pain and nausea.  Endocrine: Negative for hot flashes.  Genitourinary:  Negative for difficulty urinating, dysuria and frequency.   Musculoskeletal:  Negative for arthralgias.  Skin:  Negative  for itching and rash.  Neurological:  Negative for light-headedness and numbness.  Hematological:  Negative for adenopathy. Does not bruise/bleed easily.  Psychiatric/Behavioral:  Negative for confusion.      PHYSICAL EXAMINATION: ECOG PERFORMANCE STATUS: 2 - Symptomatic, <50% confined to bed  Vitals:   10/29/22 0933  BP: 137/86  Pulse: 78  Resp: 18  Temp: (!) 96 F (35.6 C)  SpO2: 96%   Filed Weights   10/29/22 0933  Weight: 235 lb 12.8 oz (107 kg)    Physical Exam Constitutional:      General: He is not in acute distress.    Appearance: He is obese. He is not diaphoretic.  HENT:     Head: Normocephalic and atraumatic.  Eyes:     General: No scleral icterus.    Pupils: Pupils are equal, round, and reactive to light.  Cardiovascular:     Rate and Rhythm: Normal rate.  Pulmonary:     Effort: Pulmonary effort is normal. No respiratory distress.  Abdominal:     General: There is no distension.     Palpations: Abdomen is soft.     Tenderness: There is no abdominal tenderness.  Musculoskeletal:        General: Normal range of motion.     Cervical back: Normal range of motion and neck supple.     Right lower leg: No edema.     Left lower leg: Edema present.     Comments: Left foot swelling and tenderness  Skin:    General: Skin is warm and dry.  Neurological:     Mental Status: He is alert and oriented to person, place, and time. Mental status is at baseline.     Cranial Nerves: No cranial nerve deficit.     Motor: No abnormal muscle tone.  Psychiatric:        Mood and Affect: Mood and affect normal.      LABORATORY DATA:  I have reviewed the data as listed    Latest Ref Rng & Units 10/29/2022    9:24 AM 10/27/2022    1:00 PM 10/23/2022   12:50 PM  CBC  WBC 4.0 - 10.5 K/uL 7.0  8.3  4.2   Hemoglobin 13.0 - 17.0 g/dL 9.8  28.4  9.3   Hematocrit 39.0 - 52.0 % 31.1  33.2  30.7   Platelets 150 - 400 K/uL 267  283  294       Latest Ref Rng & Units 10/29/2022     9:24 AM 10/27/2022    1:00 PM 10/23/2022   12:50 PM  CMP  Glucose 70 - 99 mg/dL 132  440  102   BUN 6 - 20 mg/dL 47  47  20   Creatinine 0.61 - 1.24 mg/dL 7.25  3.66  4.40   Sodium 135 - 145 mmol/L 137  139  137   Potassium 3.5 - 5.1 mmol/L 3.5  3.4  3.9   Chloride 98 - 111 mmol/L 103  99  106   CO2 22 - 32 mmol/L 25  25  25    Calcium 8.9 - 10.3 mg/dL 8.3  8.7  8.5   Total Protein 6.5 - 8.1 g/dL 7.3  8.3  6.8   Total Bilirubin 0.3 - 1.2 mg/dL 0.6  1.1  0.6   Alkaline Phos 38 - 126 U/L 69  79  77   AST 15 - 41 U/L 18  18  16    ALT 0 - 44 U/L 12  13  16       RADIOGRAPHIC STUDIES: I have personally reviewed the radiological images as listed and agreed with the  findings in the report. US Venous Img Lower Unilateral Left  Result Date: 10/29/2022 CLINICAL DATA:  Left lower extremity swelling. EXAM: Left LOWER EXTREMITY VENOUS DOPPLER ULTRASOUND TECHNIQUE: Gray-scale sonography with compression, as well as color and duplex ultrasound, were performed to evaluate the deep venous system(s) from the level of the common femoral vein through the popliteal and proximal calf veins. COMPARISON:  None Available. FINDINGS: VENOUS Normal compressibility of the common femoral, superficial femoral, and popliteal veins, as well as the visualized calf veins. Visualized portions of profunda femoral vein and great saphenous vein unremarkable. No filling defects to suggest DVT on grayscale or color Doppler imaging. Doppler waveforms show normal direction of venous flow, normal respiratory plasticity and response to augmentation. Limited views of the contralateral common femoral vein are unremarkable. OTHER None. Limitations: none IMPRESSION: Negative. Electronically Signed   By: Lupita Raider M.D.   On: 10/29/2022 15:38   DG Foot Complete Left  Result Date: 10/27/2022 CLINICAL DATA:  Left foot pain. EXAM: LEFT FOOT - COMPLETE 3+ VIEW COMPARISON:  September 06, 2017. FINDINGS: There is no evidence of fracture or  dislocation. There is no evidence of arthropathy or other focal bone abnormality. Vascular calcifications are noted. IMPRESSION: No acute abnormality is noted. Electronically Signed   By: Lupita Raider M.D.   On: 10/27/2022 13:43   DG Chest 2 View  Result Date: 10/27/2022 CLINICAL DATA:  Sepsis. EXAM: CHEST - 2 VIEW COMPARISON:  May 18, 2022. FINDINGS: Stable cardiomediastinal silhouette. Right internal jugular Port-A-Cath is noted with distal tip in expected position of the SVC. Both lungs are clear. The visualized skeletal structures are unremarkable. IMPRESSION: No active cardiopulmonary disease. Electronically Signed   By: Lupita Raider M.D.   On: 10/27/2022 13:42

## 2022-10-29 NOTE — Assessment & Plan Note (Signed)
Proceed with Venofer treatment today.

## 2022-10-29 NOTE — Progress Notes (Signed)
Established patient visit   Patient: Jeremiah Vance   DOB: 05/18/1970   52 y.o. Male  MRN: [id number] Visit Date: 10/29/2022  Today's healthcare provider: Jacky Kindle, FNP  Introduced to nurse practitioner role and practice setting.  All questions answered.  Discussed provider/patient relationship and expectations.  Subjective    HPI   Medications: Outpatient Medications Prior to Visit  Medication Sig   atorvastatin (LIPITOR) 40 MG tablet Take 1 tablet (40 mg total) by mouth daily at 6 PM.   busPIRone (BUSPAR) 7.5 MG tablet Take 1 tablet (7.5 mg total) by mouth 2 (two) times daily.   Continuous Glucose Sensor (FREESTYLE LIBRE 2 SENSOR) MISC Place 1 sensor on the skin every 14 days. Use to check glucose continuously   dexamethasone (DECADRON) 4 MG tablet Take 2 tablets (8 mg total) by mouth daily. Start the day after chemotherapy for 2 days. Take with food.   ondansetron (ZOFRAN) 8 MG tablet Take 8 mg by mouth every 8 (eight) hours as needed for nausea or vomiting.   ondansetron (ZOFRAN-ODT) 8 MG disintegrating tablet Take 1 tablet (8 mg total) by mouth every 8 (eight) hours as needed for nausea or vomiting.   oxyCODONE (OXY IR/ROXICODONE) 5 MG immediate release tablet Take 5 mg by mouth every 4 (four) hours as needed for severe pain.   pantoprazole (PROTONIX) 40 MG tablet Take 1 tablet (40 mg total) by mouth 2 (two) times daily before a meal.   prochlorperazine (COMPAZINE) 10 MG tablet Take 1 tablet (10 mg total) by mouth every 6 (six) hours as needed for nausea or vomiting.   scopolamine (TRANSDERM-SCOP) 1 MG/3DAYS Place 1 patch (1.5 mg total) onto the skin every 3 (three) days.   torsemide (DEMADEX) 20 MG tablet Take 2 tablets (40 mg total) by mouth daily as needed (fluid/edema).   [DISCONTINUED] dapagliflozin propanediol (FARXIGA) 10 MG TABS tablet Take 1 tablet (10 mg) by mouth once daily/please contact office to schedule follow up prior to further refills.   hydrALAZINE  (APRESOLINE) 50 MG tablet Take 1 tablet (50 mg total) by mouth in the morning and at bedtime. please contact office to schedule follow up prior to further refills. (Patient not taking: Reported on 10/29/2022)   No facility-administered medications prior to visit.      Objective    BP 137/80 (BP Location: Right Arm, Patient Position: Sitting, Cuff Size: Normal)   Pulse 98   Ht 5\' 10"  (1.778 m)   Wt 235 lb 9.6 oz (106.9 kg)   SpO2 99%   BMI 33.81 kg/m   Physical Exam Vitals and nursing note reviewed.  Constitutional:      Appearance: Normal appearance. He is obese.  HENT:     Head: Normocephalic and atraumatic.  Cardiovascular:     Rate and Rhythm: Normal rate and regular rhythm.     Pulses: Normal pulses.     Heart sounds: Normal heart sounds.  Pulmonary:     Effort: Pulmonary effort is normal.     Breath sounds: Normal breath sounds.  Musculoskeletal:        General: Normal range of motion.     Cervical back: Normal range of motion.  Skin:    General: Skin is warm and dry.     Capillary Refill: Capillary refill takes less than 2 seconds.  Neurological:     General: No focal deficit present.     Mental Status: He is alert and oriented to person, place, and time. Mental  status is at baseline.     Motor: Weakness present.     Comments: Use of walker at baseline   Psychiatric:        Mood and Affect: Mood normal.        Behavior: Behavior normal.        Thought Content: Thought content normal.        Judgment: Judgment normal.     Results for orders placed or performed in visit on 10/29/22  CBC with Differential (Cancer Center Only)  Result Value Ref Range   WBC Count 7.0 4.0 - 10.5 K/uL   RBC 3.59 (L) 4.22 - 5.81 MIL/uL   Hemoglobin 9.8 (L) 13.0 - 17.0 g/dL   HCT 53.6 (L) 64.4 - 03.4 %   MCV 86.6 80.0 - 100.0 fL   MCH 27.3 26.0 - 34.0 pg   MCHC 31.5 30.0 - 36.0 g/dL   RDW 74.2 (H) 59.5 - 63.8 %   Platelet Count 267 150 - 400 K/uL   nRBC 0.0 0.0 - 0.2 %    Neutrophils Relative % 75 %   Neutro Abs 5.3 1.7 - 7.7 K/uL   Lymphocytes Relative 11 %   Lymphs Abs 0.7 0.7 - 4.0 K/uL   Monocytes Relative 10 %   Monocytes Absolute 0.7 0.1 - 1.0 K/uL   Eosinophils Relative 3 %   Eosinophils Absolute 0.2 0.0 - 0.5 K/uL   Basophils Relative 1 %   Basophils Absolute 0.0 0.0 - 0.1 K/uL   Immature Granulocytes 0 %   Abs Immature Granulocytes 0.02 0.00 - 0.07 K/uL  CMP (Cancer Center only)  Result Value Ref Range   Sodium 137 135 - 145 mmol/L   Potassium 3.5 3.5 - 5.1 mmol/L   Chloride 103 98 - 111 mmol/L   CO2 25 22 - 32 mmol/L   Glucose, Bld 162 (H) 70 - 99 mg/dL   BUN 47 (H) 6 - 20 mg/dL   Creatinine 7.56 (H) 4.33 - 1.24 mg/dL   Calcium 8.3 (L) 8.9 - 10.3 mg/dL   Total Protein 7.3 6.5 - 8.1 g/dL   Albumin 3.1 (L) 3.5 - 5.0 g/dL   AST 18 15 - 41 U/L   ALT 12 0 - 44 U/L   Alkaline Phosphatase 69 38 - 126 U/L   Total Bilirubin 0.6 0.3 - 1.2 mg/dL   GFR, Estimated >29 >51 mL/min   Anion gap 9 5 - 15    Assessment & Plan     Problem List Items Addressed This Visit       Endocrine   Type 2 diabetes mellitus with hyperglycemia, without long-term current use of insulin (HCC)    Chronic, stable Reports BG have been running high Also has been out of [place]; samples provided of similar agent Medication resent at 90 day vs 180 day given ongoing insurance auth       Relevant Medications   dapagliflozin propanediol (FARXIGA) 10 MG TABS tablet     Genitourinary   AKI (acute kidney injury) (HCC) - Primary    eGFR improved; continue to monitor PO intake and output Known risk factor includes current chemotherapy regimen  F/b multiple providers        Other   Mood disorder (HCC)    Acute on chronic, worsening since dx of gastric cancer Will start low dose zoloft at 50 mg Recommend 6 week f/u with pcp I've explained to him that drugs of the SSRI class can have side effects such as  weight gain, sexual dysfunction, insomnia, headache, nausea.  These medications are generally effective at alleviating symptoms of anxiety and/or depression. Let me know if significant side effects do occur.       RESOLVED: Morbid obesity (HCC)   Relevant Medications   dapagliflozin propanediol (FARXIGA) 10 MG TABS tablet    Return in about 6 weeks (around 12/10/2022) for anxiety and depression.      Leilani Merl, FNP, have reviewed all documentation for this visit. The documentation on 10/29/22 for the exam, diagnosis, procedures, and orders are all accurate and complete.  Jacky Kindle, FNP  [facility] [phone] (phone) [phone] (fax)  [facility]

## 2022-10-29 NOTE — Assessment & Plan Note (Signed)
Acute on chronic, worsening since dx of gastric cancer Will start low dose zoloft at 50 mg Recommend 6 week f/u with pcp I've explained to him that drugs of the SSRI class can have side effects such as weight gain, sexual dysfunction, insomnia, headache, nausea. These medications are generally effective at alleviating symptoms of anxiety and/or depression. Let me know if significant side effects do occur.

## 2022-10-30 ENCOUNTER — Ambulatory Visit: Payer: Medicaid Other | Admitting: Physical Therapy

## 2022-10-30 ENCOUNTER — Ambulatory Visit: Payer: Medicaid Other

## 2022-10-31 ENCOUNTER — Inpatient Hospital Stay: Payer: Medicaid Other | Attending: Oncology

## 2022-10-31 VITALS — BP 137/83 | HR 80 | Resp 18

## 2022-10-31 DIAGNOSIS — Z5111 Encounter for antineoplastic chemotherapy: Secondary | ICD-10-CM | POA: Insufficient documentation

## 2022-10-31 DIAGNOSIS — D509 Iron deficiency anemia, unspecified: Secondary | ICD-10-CM | POA: Diagnosis not present

## 2022-10-31 DIAGNOSIS — C160 Malignant neoplasm of cardia: Secondary | ICD-10-CM | POA: Insufficient documentation

## 2022-10-31 MED ORDER — SODIUM CHLORIDE 0.9% FLUSH
10.0000 mL | INTRAVENOUS | Status: DC | PRN
  Administered 2022-10-31: 10 mL
  Filled 2022-10-31: qty 10

## 2022-10-31 MED ORDER — HEPARIN SOD (PORK) LOCK FLUSH 100 UNIT/ML IV SOLN
500.0000 [IU] | Freq: Once | INTRAVENOUS | Status: AC | PRN
  Administered 2022-10-31: 500 [IU]
  Filled 2022-10-31: qty 5

## 2022-11-01 ENCOUNTER — Encounter: Payer: Self-pay | Admitting: Oncology

## 2022-11-01 ENCOUNTER — Encounter: Payer: Self-pay | Admitting: Pharmacist

## 2022-11-01 ENCOUNTER — Other Ambulatory Visit: Payer: Self-pay | Admitting: Physician Assistant

## 2022-11-01 DIAGNOSIS — E1165 Type 2 diabetes mellitus with hyperglycemia: Secondary | ICD-10-CM

## 2022-11-01 NOTE — Progress Notes (Signed)
   11/01/2022 Name: SAMUELL WOOLDRIDGE MRN: [id number] DOB: July 20, 1970  Chief Complaint  Patient presents with   Medication Access    Jayvian D Hallum is a 52 y.o. year old male who presented for a telephone visit.   They were referred to the pharmacist by their PCP for assistance in managing medication access.   From review of chart, note patient having difficulty with picking up his Marcelline Deist from [employer] as pharmacy has advised that insurance is requiring a prior authorization (PA) for this medication.  Submit prior authorization for Farxiga to patient's Managed Medicaid [employer] coverage via covermymeds today. - Receive a response back via coverymymeds stating that patient has alternative primary coverage  Follow up with [facility] and am advised that pharmacy is receiving the same notification, in addition to the information about prescription needing PA  Follow up with patient by telephone.   From review of chart, note patient previously had [employer]. Patient reports that this coverage ended months ago.  Plan:  Patient to follow up with Managed Medicaid [employer] member services to let his plan know that he no longer has commercial coverage.   Patient to then follow up with [facility] to request pharmacy re-run his Farxiga prescription   Follow Up Plan:   Patient denies further medication questions or concerns today. Denies need to schedule follow up appointment  Provide patient with contact information for clinical pharmacist. Patient states that he will call me back once he has reached out to both his Medicaid and his pharmacy   Estelle Grumbles, PharmD, [facility] [phone]

## 2022-11-02 DIAGNOSIS — C160 Malignant neoplasm of cardia: Secondary | ICD-10-CM | POA: Diagnosis not present

## 2022-11-04 ENCOUNTER — Inpatient Hospital Stay (HOSPITAL_BASED_OUTPATIENT_CLINIC_OR_DEPARTMENT_OTHER): Payer: Medicaid Other | Admitting: Hospice and Palliative Medicine

## 2022-11-04 ENCOUNTER — Ambulatory Visit: Payer: Medicaid Other

## 2022-11-04 ENCOUNTER — Ambulatory Visit: Payer: Medicaid Other | Admitting: Physical Therapy

## 2022-11-04 DIAGNOSIS — C160 Malignant neoplasm of cardia: Secondary | ICD-10-CM

## 2022-11-04 NOTE — Progress Notes (Signed)
Voicemail left.

## 2022-11-05 ENCOUNTER — Telehealth: Payer: Self-pay | Admitting: Physician Assistant

## 2022-11-05 DIAGNOSIS — E1165 Type 2 diabetes mellitus with hyperglycemia: Secondary | ICD-10-CM

## 2022-11-05 NOTE — Telephone Encounter (Signed)
[facility] is requesting prior authorization Key: [id number] Name: Jeremiah Vance 10 mg tablets

## 2022-11-06 ENCOUNTER — Telehealth: Payer: Self-pay

## 2022-11-06 ENCOUNTER — Ambulatory Visit: Payer: Medicaid Other | Admitting: Physical Therapy

## 2022-11-06 ENCOUNTER — Ambulatory Visit: Payer: Medicaid Other

## 2022-11-06 NOTE — Telephone Encounter (Signed)
Spoke with pt re: plan to d/c all OT/PT visits at this time d/t pt c multiple medical appts for ca tx. Urged pt to call clinic when he felt ready and able to return to tx & pt or rehab to help with obtaining new order for tx. Pt in agreement with plan.   Danelle Earthly, MS, OTR/L

## 2022-11-08 NOTE — Patient Instructions (Signed)
Goals Addressed             This Visit's Progress    Pharmacy Goals       Please follow up with your Managed Medicaid [employer] coverage member services at [phone] as discussed.  Please give me a call if you have further medication questions or concerns.  Estelle Grumbles, PharmD, [facility] [phone]

## 2022-11-11 ENCOUNTER — Ambulatory Visit: Payer: Medicaid Other | Admitting: Physical Therapy

## 2022-11-11 ENCOUNTER — Ambulatory Visit: Payer: Medicaid Other

## 2022-11-11 MED FILL — Dexamethasone Sodium Phosphate Inj 100 MG/10ML: INTRAMUSCULAR | Qty: 1 | Status: AC

## 2022-11-11 NOTE — Telephone Encounter (Signed)
PA initiated

## 2022-11-12 ENCOUNTER — Telehealth: Payer: Self-pay

## 2022-11-12 ENCOUNTER — Encounter: Payer: Self-pay | Admitting: Oncology

## 2022-11-12 ENCOUNTER — Inpatient Hospital Stay: Payer: Medicaid Other

## 2022-11-12 ENCOUNTER — Ambulatory Visit: Payer: Medicaid Other

## 2022-11-12 ENCOUNTER — Other Ambulatory Visit: Payer: Self-pay

## 2022-11-12 ENCOUNTER — Inpatient Hospital Stay: Payer: Medicaid Other | Admitting: Oncology

## 2022-11-12 VITALS — BP 149/82 | HR 73 | Temp 96.2°F | Resp 18 | Wt 246.6 lb

## 2022-11-12 DIAGNOSIS — T451X5A Adverse effect of antineoplastic and immunosuppressive drugs, initial encounter: Secondary | ICD-10-CM

## 2022-11-12 DIAGNOSIS — C160 Malignant neoplasm of cardia: Secondary | ICD-10-CM | POA: Diagnosis not present

## 2022-11-12 DIAGNOSIS — G620 Drug-induced polyneuropathy: Secondary | ICD-10-CM

## 2022-11-12 DIAGNOSIS — D500 Iron deficiency anemia secondary to blood loss (chronic): Secondary | ICD-10-CM

## 2022-11-12 DIAGNOSIS — K7469 Other cirrhosis of liver: Secondary | ICD-10-CM | POA: Diagnosis not present

## 2022-11-12 DIAGNOSIS — C801 Malignant (primary) neoplasm, unspecified: Secondary | ICD-10-CM

## 2022-11-12 DIAGNOSIS — D509 Iron deficiency anemia, unspecified: Secondary | ICD-10-CM | POA: Diagnosis not present

## 2022-11-12 DIAGNOSIS — F411 Generalized anxiety disorder: Secondary | ICD-10-CM | POA: Diagnosis not present

## 2022-11-12 DIAGNOSIS — I480 Paroxysmal atrial fibrillation: Secondary | ICD-10-CM

## 2022-11-12 DIAGNOSIS — Z5111 Encounter for antineoplastic chemotherapy: Secondary | ICD-10-CM | POA: Diagnosis not present

## 2022-11-12 LAB — CBC WITH DIFFERENTIAL (CANCER CENTER ONLY)
Abs Immature Granulocytes: 0.02 10*3/uL (ref 0.00–0.07)
Basophils Absolute: 0 10*3/uL (ref 0.0–0.1)
Basophils Relative: 0 %
Eosinophils Absolute: 0.2 10*3/uL (ref 0.0–0.5)
Eosinophils Relative: 4 %
HCT: 30.4 % — ABNORMAL LOW (ref 39.0–52.0)
Hemoglobin: 9.2 g/dL — ABNORMAL LOW (ref 13.0–17.0)
Immature Granulocytes: 0 %
Lymphocytes Relative: 15 %
Lymphs Abs: 0.9 10*3/uL (ref 0.7–4.0)
MCH: 26.2 pg (ref 26.0–34.0)
MCHC: 30.3 g/dL (ref 30.0–36.0)
MCV: 86.6 fL (ref 80.0–100.0)
Monocytes Absolute: 0.6 10*3/uL (ref 0.1–1.0)
Monocytes Relative: 10 %
Neutro Abs: 4.1 10*3/uL (ref 1.7–7.7)
Neutrophils Relative %: 71 %
Platelet Count: 191 10*3/uL (ref 150–400)
RBC: 3.51 MIL/uL — ABNORMAL LOW (ref 4.22–5.81)
RDW: 19 % — ABNORMAL HIGH (ref 11.5–15.5)
WBC Count: 5.8 10*3/uL (ref 4.0–10.5)
nRBC: 0 % (ref 0.0–0.2)

## 2022-11-12 LAB — RETIC PANEL
Immature Retic Fract: 20.2 % — ABNORMAL HIGH (ref 2.3–15.9)
RBC.: 3.5 MIL/uL — ABNORMAL LOW (ref 4.22–5.81)
Retic Count, Absolute: 96.6 10*3/uL (ref 19.0–186.0)
Retic Ct Pct: 2.8 % (ref 0.4–3.1)
Reticulocyte Hemoglobin: 26.7 pg — ABNORMAL LOW (ref >27.9)

## 2022-11-12 LAB — CMP (CANCER CENTER ONLY)
ALT: 18 U/L (ref 0–44)
AST: 16 U/L (ref 15–41)
Albumin: 2.9 g/dL — ABNORMAL LOW (ref 3.5–5.0)
Alkaline Phosphatase: 93 U/L (ref 38–126)
Anion gap: 6 (ref 5–15)
BUN: 26 mg/dL — ABNORMAL HIGH (ref 6–20)
CO2: 21 mmol/L — ABNORMAL LOW (ref 22–32)
Calcium: 8.1 mg/dL — ABNORMAL LOW (ref 8.9–10.3)
Chloride: 112 mmol/L — ABNORMAL HIGH (ref 98–111)
Creatinine: 1.02 mg/dL (ref 0.61–1.24)
GFR, Estimated: >60 mL/min (ref >60)
Glucose, Bld: 168 mg/dL — ABNORMAL HIGH (ref 70–99)
Potassium: 3.7 mmol/L (ref 3.5–5.1)
Sodium: 139 mmol/L (ref 135–145)
Total Bilirubin: 0.4 mg/dL (ref 0.3–1.2)
Total Protein: 6.5 g/dL (ref 6.5–8.1)

## 2022-11-12 LAB — IRON AND TIBC
Iron: 40 ug/dL — ABNORMAL LOW (ref 45–182)
Saturation Ratios: 16 % — ABNORMAL LOW (ref 17.9–39.5)
TIBC: 244 ug/dL — ABNORMAL LOW (ref 250–450)
UIBC: 204 ug/dL

## 2022-11-12 LAB — FERRITIN: Ferritin: 214 ng/mL (ref 24–336)

## 2022-11-12 MED ORDER — SODIUM CHLORIDE 0.9 % IV SOLN
2400.0000 mg/m2 | INTRAVENOUS | Status: DC
  Administered 2022-11-12: 5000 mg via INTRAVENOUS
  Filled 2022-11-12: qty 100

## 2022-11-12 MED ORDER — DEXTROSE 5 % IV SOLN
Freq: Once | INTRAVENOUS | Status: AC
  Filled 2022-11-12: qty 250

## 2022-11-12 MED ORDER — SODIUM CHLORIDE 0.9 % IV SOLN
Freq: Once | INTRAVENOUS | Status: AC
  Filled 2022-11-12: qty 250

## 2022-11-12 MED ORDER — OXALIPLATIN CHEMO INJECTION 100 MG/20ML
65.0000 mg/m2 | Freq: Once | INTRAVENOUS | Status: AC
  Administered 2022-11-12: 150 mg via INTRAVENOUS
  Filled 2022-11-12: qty 20

## 2022-11-12 MED ORDER — SODIUM CHLORIDE 0.9 % IV SOLN
200.0000 mg | Freq: Once | INTRAVENOUS | Status: AC
  Administered 2022-11-12: 200 mg via INTRAVENOUS
  Filled 2022-11-12: qty 200

## 2022-11-12 MED ORDER — PALONOSETRON HCL INJECTION 0.25 MG/5ML
0.2500 mg | Freq: Once | INTRAVENOUS | Status: AC
  Administered 2022-11-12: 0.25 mg via INTRAVENOUS
  Filled 2022-11-12: qty 5

## 2022-11-12 MED ORDER — SODIUM CHLORIDE 0.9 % IV SOLN
10.0000 mg | Freq: Once | INTRAVENOUS | Status: AC
  Administered 2022-11-12: 10 mg via INTRAVENOUS
  Filled 2022-11-12: qty 10

## 2022-11-12 MED ORDER — LEUCOVORIN CALCIUM INJECTION 350 MG
400.0000 mg/m2 | Freq: Once | INTRAVENOUS | Status: AC
  Administered 2022-11-12: 888 mg via INTRAVENOUS
  Filled 2022-11-12: qty 44.4

## 2022-11-12 NOTE — Assessment & Plan Note (Signed)
Differential includes neuropathy, DVT, arthritis etc. negative venous US of LLE to rule out DVT Continue gabapentin for possible neuropathy.

## 2022-11-12 NOTE — Progress Notes (Signed)
Pt here for follow up. No new concerns voiced.   

## 2022-11-12 NOTE — Assessment & Plan Note (Signed)
Recommend Ativan 0.5 mg every 12 hours as needed for anxiety/sleep/nausea 

## 2022-11-12 NOTE — Telephone Encounter (Signed)
[facility] with PDL1 requested on [id number], GEJ mass biopsy, collected 09/10/2022.   Financial application was submitted and approved. Pt's out of-of-pocket cost will be $0.

## 2022-11-12 NOTE — Patient Instructions (Signed)
 [facility]  Discharge Instructions: Thank you for choosing [facility] to provide your oncology and hematology care.  If you have a lab appointment with the [facility], please go directly to the [facility] and check in at the registration area.  Wear comfortable clothing and clothing appropriate for easy access to any Portacath or PICC line.   We strive to give you quality time with your provider. You may need to reschedule your appointment if you arrive late (15 or more minutes).  Arriving late affects you and other patients whose appointments are after yours.  Also, if you miss three or more appointments without notifying the office, you may be dismissed from the clinic at the provider's discretion.      For prescription refill requests, have your pharmacy contact our office and allow 72 hours for refills to be completed.    Today you received the following chemotherapy and/or immunotherapy agents Oxaliplatin, Leucovorin, Adrucil       To help prevent nausea and vomiting after your treatment, we encourage you to take your nausea medication as directed.  BELOW ARE SYMPTOMS THAT SHOULD BE REPORTED IMMEDIATELY: *FEVER GREATER THAN 100.4 F (38 C) OR HIGHER *CHILLS OR SWEATING *NAUSEA AND VOMITING THAT IS NOT CONTROLLED WITH YOUR NAUSEA MEDICATION *UNUSUAL SHORTNESS OF BREATH *UNUSUAL BRUISING OR BLEEDING *URINARY PROBLEMS (pain or burning when urinating, or frequent urination) *BOWEL PROBLEMS (unusual diarrhea, constipation, pain near the anus) TENDERNESS IN MOUTH AND THROAT WITH OR WITHOUT PRESENCE OF ULCERS (sore throat, sores in mouth, or a toothache) UNUSUAL RASH, SWELLING OR PAIN  UNUSUAL VAGINAL DISCHARGE OR ITCHING   Items with * indicate a potential emergency and should be followed up as soon as possible or go to the Emergency Department if any problems should occur.  Please show the CHEMOTHERAPY ALERT CARD or IMMUNOTHERAPY  ALERT CARD at check-in to the Emergency Department and triage nurse.  Should you have questions after your visit or need to cancel or reschedule your appointment, please contact [facility]  [phone] and follow the prompts.  Office hours are 8:00 a.m. to 4:30 p.m. Monday - Friday. Please note that voicemails left after 4:00 p.m. may not be returned until the following business day.  We are closed weekends and major holidays. You have access to a nurse at all times for urgent questions. Please call the main number to the clinic [phone] and follow the prompts.  For any non-urgent questions, you may also contact your provider using MyChart. We now offer e-Visits for anyone 40 and older to request care online for non-urgent symptoms. For details visit [email or web address].   Also download the MyChart app! Go to the app store, search "MyChart", open the app, select [facility], and log in with your MyChart username and password.

## 2022-11-12 NOTE — Telephone Encounter (Signed)
outcome Denied today by [employer] Medicaid 2017 Denied Drug Farxiga 10MG  tablets

## 2022-11-12 NOTE — Assessment & Plan Note (Signed)
LFT is stable.

## 2022-11-12 NOTE — Progress Notes (Signed)
Hematology/Oncology Progress note [phone] [phone]        REFERRING PROVIDER: Rickard Patience, MD    CHIEF COMPLAINTS/PURPOSE OF CONSULTATION:  GE junction cancer/gastric cardia cancer.  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  NGS molecular studies are pending.  May add immunotherapy if he is eligible. Labs are reviewed and discussed with patient. Proceed with 5-FU +Oxaliplatin 65mg /m2   Other cirrhosis of liver (HCC) LFT is stable.   IDA (iron deficiency anemia) Proceed with Venofer treatment today.   Anxiety associated with cancer diagnosis (HCC) Recommend Ativan 0.5 mg every 12 hours as needed for anxiety/sleep/nausea  Chemotherapy-induced neuropathy (HCC) Differential includes neuropathy, DVT, arthritis etc. negative venous US of LLE to rule out DVT Continue gabapentin for possible neuropathy.   Paroxysmal atrial fibrillation (HCC) Hold Eliquis due to GI bleeding/anemia   Orders Placed This Encounter  Procedures   CEA    Standing Status:   Future    Standing Expiration Date:   11/26/2023   CBC with Differential (Cancer Center Only)    Standing Status:   Future    Standing Expiration Date:   11/26/2023   CMP (Cancer Center only)    Standing Status:   Future    Standing Expiration Date:   11/26/2023   CEA    Standing Status:   Future    Standing Expiration Date:   12/10/2023   CBC with Differential (Cancer Center Only)    Standing Status:   Future    Standing Expiration Date:   12/10/2023   CMP (Cancer Center only)    Standing Status:   Future    Standing Expiration Date:   12/10/2023    2 weeks lab MD FOLFOX  All questions were answered. The patient  knows to call the clinic with any problems, questions or concerns.  Rickard Patience, MD, PhD [facility] Hematology Oncology 11/12/2022    HISTORY OF PRESENTING ILLNESS:  Jeremiah Vance 52 y.o. male presents to establish care for GE junction/gastric cardia adenocarcinoma I have reviewed his chart and materials related to his cancer extensively and collaborated history with the patient. Summary of oncologic history is as follows: Oncology History  Gastric cancer (HCC)  09/08/2022 Imaging   CT abdomen pelvis w contrast     09/08/2022 Imaging   CT abdomen pelvis with contrast  Diffuse masslike thickening in the area of the GE junction and upper stomach with the extensive abnormal lymph nodes throughout the abdomen including retrocrural, porta hepatis, retroperitoneum and lesser and greater curve of the stomach. In addition there are areas of suggest peritoneal carcinomatosis. Recommend further evaluation.    No bowel obstruction. Large midline anterior pelvic wall hernia involving small bowel and mesenteric fat with rectus muscle diastasis. Gallstones.   09/19/2022 Cancer Staging   Staging form: Stomach, AJCC 8th Edition - Clinical stage from 09/19/2022: Stage IVB (cTX, cN2, cM1) - Signed by Rickard Patience, MD on 09/19/2022 Stage prefix: Initial diagnosis   09/19/2022 Initial Diagnosis   Gastric cancer (HCC)  09/08/2022, patient presented emergency room for evaluation of abdominal pain and vomiting.  He has also had weight loss.  Dysphagia with solid food for couple of months.  CT abdomen pelvis showed diffuse masslike thickening  of the GE junction and upper stomach with extensive abnormal lymph nodes throughout abdomen.  09/10/2022 upper endoscopy showed malignant appearance gastric tumor at the GE junction and in the cardia Biopsied.  Nodular mucosa in the second portion of duodenum.  Biopsied. GE junction mass showed moderate to poorly differentiated adenocarcinoma undermining squamous mucosa with focal  area of squamous atypia.  Cannot exclude adenosquamous carcinoma.  Intestinal metaplasia is not identified. Duodenum cold biopsy-reactive duodenitis.  Negative for dysplasia and malignancy. Stomach random biopsy negative for H. pylori, intestinal metaplasia, dysplasia and malignancy.  Stomach Cardia mass biopsy showed moderate to poorly differentiated adenocarcinoma.  Patient's case was discussed on tumor board on 09/19/2022.  Per Dr. Oneita Kras, nephrology findings of both GE junction mass and gastric cardia mass or similar. HER2 staining is pending. Molecular testing is pending.     09/23/2022 Imaging   PET scan showed 1. 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 2. Enlarged cirrhotic liver. 3. Cholelithiasis. 4. Bilateral renal stones. 5. Umbilical hernias contain unobstructed small bowel and/or fat.  6. Aortic atherosclerosis (ICD10-I70.0). Coronary artery calcification. 7. Enlarged pulmonic trunk, indicative of pulmonary arterial hypertension.    10/02/2022 -  Chemotherapy   Patient is on Treatment Plan : GASTROESOPHAGEAL FOLFOX q14d     He lives at home with his sister.  Patient is undergoing neuro physical therapy. 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 A fib previously on Eliquis, currently on hold.  INTERVAL HISTORY Jeremiah Vance is a 52 y.o. male who has above history reviewed by me today presents for follow up visit for stage IV GE junction/gastric cancer.   Overall he tolerates chemotherapy treatments.  Denies any nausea vomiting diarrhea. Appetite has improved. Gained weight.   MEDICAL HISTORY:  Past Medical History:  Diagnosis Date   Acute ischemic stroke (HCC) 09/06/2017   Acute renal failure superimposed on stage 3a chronic kidney disease (HCC) 07/08/2019   Acute right-sided weakness    Allergies    Anasarca    Anemia 07/10/2017   Arthritis     Cancer (HCC)    stomach and esophageal cancer stage 4   CHF (congestive heart failure) (HCC)    "coinsided w/kidney problems I was having 06/2017"   Chicken pox    CKD (chronic kidney disease) stage 3, GFR 30-59 ml/min (HCC) 06/2017   Depression    Elevated troponin 07/08/2019   High cholesterol    History of cardiomyopathy    LVEF 40 to 45% in April 2019 - subsequently normalized   Hyperbilirubinemia 07/10/2017   Hypertension    Ischemic stroke (HCC)    Small left internal capsule infarct due to lacunar disease   Morbid obesity (HCC)    Normocytic anemia 12/16/2017   Recurrent incisional hernia with incarceration s/p repair 10/22/2017 10/21/2017   Stroke (HCC) 08/2017   "right sided weakness since; getting stronger though" (10/22/2017)   Type 2 diabetes mellitus (HCC)     SURGICAL HISTORY: Past Surgical History:  Procedure Laterality Date   ABDOMINAL HERNIA REPAIR  2008; 10/22/2017   "scope; OPEN REPAIR INCARCERATED VENTRAL HERNIA   ESOPHAGOGASTRODUODENOSCOPY (EGD) WITH PROPOFOL N/A 09/10/2022   Procedure: ESOPHAGOGASTRODUODENOSCOPY (EGD) WITH PROPOFOL;  Surgeon: Toney Reil, MD;  Location: [facility] ENDOSCOPY;  Service: Gastroenterology;  Laterality: N/A;   ESOPHAGOGASTRODUODENOSCOPY (EGD) WITH PROPOFOL N/A 09/28/2022   Procedure: ESOPHAGOGASTRODUODENOSCOPY (EGD) WITH PROPOFOL;  Surgeon: Jaynie Collins, DO;  Location: [facility] ENDOSCOPY;  Service: Gastroenterology;  Laterality: N/A;   HEMOSTASIS CONTROL  09/28/2022   Procedure: HEMOSTASIS CONTROL;  Surgeon: Jaynie Collins, DO;  Location: [facility] ENDOSCOPY;  Service: Gastroenterology;;   HERNIA REPAIR     KNEE ARTHROSCOPY Right 1989   PORTA CATH INSERTION N/A 09/25/2022   Procedure: PORTA CATH INSERTION;  Surgeon: Annice Needy, MD;  Location: [facility] INVASIVE CV LAB;  Service: Cardiovascular;  Laterality: N/A;   VENTRAL HERNIA REPAIR N/A 10/22/2017   Procedure: OPEN REPAIR INCARCERATED VENTRAL HERNIA;  Surgeon: Glenna Fellows, MD;  Location: [facility] OR;  Service: General;  Laterality: N/A;    SOCIAL HISTORY: Social History   Socioeconomic History   Marital status: Divorced    Spouse name: Not on file   Number of children: Not on file   Years of education: Not on file   Highest education level: Not on file  Occupational History   Not on file  Tobacco Use   Smoking status: Never   Smokeless tobacco: Never  Vaping Use   Vaping status: Never Used  Substance and Sexual Activity   Alcohol use: Yes    Comment: occasional beer   Drug use: Never   Sexual activity: Not Currently  Other Topics Concern   Not on file  Social History Narrative   He lives with his sister , [name] and she is his MPOA after his stokes   Social Determinants of Health   Financial Resource Strain: High Risk (03/20/2020)   Overall Financial Resource Strain (CARDIA)    Difficulty of Paying Living Expenses: Hard  Food Insecurity: No Food Insecurity (09/27/2022)   Hunger Vital Sign    Worried About Running Out of Food in the Last Year: Never true    Ran Out of Food in the Last Year: Never true  Transportation Needs: Unmet Transportation Needs (09/27/2022)   PRAPARE - Administrator, Civil Service (Medical): No    Lack of Transportation (Non-Medical): Yes  Physical Activity: Not on file  Stress: Not on file  Social Connections: Not on file  Intimate Partner Violence: Not At Risk (09/27/2022)   Humiliation, Afraid, Rape, and Kick questionnaire    Fear of Current or Ex-Partner: No    Emotionally Abused: No    Physically Abused: No    Sexually Abused: No    FAMILY HISTORY: Family History  Problem Relation Age of Onset   Diabetes Mother    Stroke Mother    Arthritis Mother    Depression Mother    Heart disease Mother    Hypertension Mother    Learning disabilities Mother    Mental illness Mother    Sleep apnea Mother    Diabetes Father    Heart disease Father    Arthritis Father    Hearing loss Father     Hyperlipidemia Father    Heart attack Father    Hypertension Father    Stroke Father    Diabetes Sister    Depression Sister    Diabetes Sister    Hypertension Sister    Mental illness Sister    Sleep apnea Sister    Diabetes Maternal Grandmother    Heart disease Maternal Grandmother    Depression Maternal Grandmother    Hyperlipidemia Maternal Grandmother    Hypertension Maternal Grandmother    Stroke Maternal Grandmother    Hypertension Maternal Grandfather    Hyperlipidemia Maternal Grandfather    Stroke Maternal Grandfather    Arthritis Paternal Grandmother    Hearing loss Paternal Grandmother  Stroke Paternal Grandfather    Heart disease Paternal Grandfather    Arthritis Paternal Grandfather    Heart attack Paternal Grandfather    Melanoma Other     ALLERGIES:  is allergic to pollen extract and sulfa antibiotics.  MEDICATIONS:  Current Outpatient Medications  Medication Sig Dispense Refill   atorvastatin (LIPITOR) 40 MG tablet Take 1 tablet (40 mg total) by mouth daily at 6 PM. 90 tablet 3   busPIRone (BUSPAR) 7.5 MG tablet Take 1 tablet (7.5 mg total) by mouth 2 (two) times daily. 180 tablet 1   Continuous Glucose Sensor (FREESTYLE LIBRE 2 SENSOR) MISC Place 1 sensor on the skin every 14 days. Use to check glucose continuously 2 each 11   dexamethasone (DECADRON) 4 MG tablet Take 2 tablets (8 mg total) by mouth daily. Start the day after chemotherapy for 2 days. Take with food. 30 tablet 1   gabapentin (NEURONTIN) 100 MG capsule Take 1 capsule (100 mg total) by mouth 2 (two) times daily. 60 capsule 0   ondansetron (ZOFRAN) 8 MG tablet Take 8 mg by mouth every 8 (eight) hours as needed for nausea or vomiting.     ondansetron (ZOFRAN-ODT) 8 MG disintegrating tablet Take 1 tablet (8 mg total) by mouth every 8 (eight) hours as needed for nausea or vomiting. 60 tablet 1   oxyCODONE (OXY IR/ROXICODONE) 5 MG immediate release tablet Take 5 mg by mouth every 4 (four) hours  as needed for severe pain.     pantoprazole (PROTONIX) 40 MG tablet Take 1 tablet (40 mg total) by mouth 2 (two) times daily before a meal. 60 tablet 3   prochlorperazine (COMPAZINE) 10 MG tablet Take 1 tablet (10 mg total) by mouth every 6 (six) hours as needed for nausea or vomiting. 60 tablet 0   scopolamine (TRANSDERM-SCOP) 1 MG/3DAYS Place 1 patch (1.5 mg total) onto the skin every 3 (three) days. 4 patch 0   sertraline (ZOLOFT) 50 MG tablet Take 1 tablet (50 mg total) by mouth daily. 30 tablet 3   torsemide (DEMADEX) 20 MG tablet Take 2 tablets (40 mg total) by mouth daily as needed (fluid/edema). 90 tablet 3   dapagliflozin propanediol (FARXIGA) 10 MG TABS tablet Take 1 tablet (10 mg total) by mouth daily. Take 1 tablet (10 mg) by mouth once daily/please contact office to schedule follow up prior to further refills. (Patient not taking: Reported on 11/12/2022) 90 tablet 3   No current facility-administered medications for this visit.    Review of Systems  Constitutional:  Positive for fatigue and unexpected weight change. Negative for appetite change, chills and fever.  HENT:   Negative for hearing loss and voice change.   Eyes:  Negative for eye problems and icterus.  Respiratory:  Negative for chest tightness, cough and shortness of breath.   Cardiovascular:  Negative for chest pain and leg swelling.  Gastrointestinal:  Negative for abdominal distention, abdominal pain and nausea.  Endocrine: Negative for hot flashes.  Genitourinary:  Negative for difficulty urinating, dysuria and frequency.   Musculoskeletal:  Negative for arthralgias.  Skin:  Negative for itching and rash.  Neurological:  Negative for light-headedness and numbness.  Hematological:  Negative for adenopathy. Does not bruise/bleed easily.  Psychiatric/Behavioral:  Negative for confusion.      PHYSICAL EXAMINATION: ECOG PERFORMANCE STATUS: 2 - Symptomatic, <50% confined to bed  Vitals:   11/12/22 0915  BP: (!)  149/82  Pulse: 73  Resp: 18  Temp: (!) 96.2 F (35.7  C)  SpO2: 99%   Filed Weights   11/12/22 0915  Weight: 246 lb 9.6 oz (111.9 kg)    Physical Exam Constitutional:      General: He is not in acute distress.    Appearance: He is obese. He is not diaphoretic.  HENT:     Head: Normocephalic and atraumatic.  Eyes:     General: No scleral icterus.    Pupils: Pupils are equal, round, and reactive to light.  Cardiovascular:     Rate and Rhythm: Normal rate.  Pulmonary:     Effort: Pulmonary effort is normal. No respiratory distress.  Abdominal:     General: There is no distension.     Palpations: Abdomen is soft.     Tenderness: There is no abdominal tenderness.  Musculoskeletal:        General: Normal range of motion.     Cervical back: Normal range of motion and neck supple.     Right lower leg: No edema.     Left lower leg: Edema present.     Comments: Left foot swelling and tenderness  Skin:    General: Skin is warm and dry.  Neurological:     Mental Status: He is alert and oriented to person, place, and time. Mental status is at baseline.     Cranial Nerves: No cranial nerve deficit.     Motor: No abnormal muscle tone.  Psychiatric:        Mood and Affect: Mood and affect normal.      LABORATORY DATA:  I have reviewed the data as listed    Latest Ref Rng & Units 11/12/2022    8:50 AM 10/29/2022    9:24 AM 10/27/2022    1:00 PM  CBC  WBC 4.0 - 10.5 K/uL 5.8  7.0  8.3   Hemoglobin 13.0 - 17.0 g/dL 9.2  9.8  16.1   Hematocrit 39.0 - 52.0 % 30.4  31.1  33.2   Platelets 150 - 400 K/uL 191  267  283       Latest Ref Rng & Units 11/12/2022    8:50 AM 10/29/2022    9:24 AM 10/27/2022    1:00 PM  CMP  Glucose 70 - 99 mg/dL 096  045  409   BUN 6 - 20 mg/dL 26  47  47   Creatinine 0.61 - 1.24 mg/dL 8.11  9.14  7.82   Sodium 135 - 145 mmol/L 139  137  139   Potassium 3.5 - 5.1 mmol/L 3.7  3.5  3.4   Chloride 98 - 111 mmol/L 112  103  99   CO2 22 - 32 mmol/L 21  25   25    Calcium 8.9 - 10.3 mg/dL 8.1  8.3  8.7   Total Protein 6.5 - 8.1 g/dL 6.5  7.3  8.3   Total Bilirubin 0.3 - 1.2 mg/dL 0.4  0.6  1.1   Alkaline Phos 38 - 126 U/L 93  69  79   AST 15 - 41 U/L 16  18  18    ALT 0 - 44 U/L 18  12  13       RADIOGRAPHIC STUDIES: I have personally reviewed the radiological images as listed and agreed with the findings in the report. US Venous Img Lower Unilateral Left  Result Date: 10/29/2022 CLINICAL DATA:  Left lower extremity swelling. EXAM: Left LOWER EXTREMITY VENOUS DOPPLER ULTRASOUND TECHNIQUE: Gray-scale sonography with compression, as well as color and duplex ultrasound, were  performed to evaluate the deep venous system(s) from the level of the common femoral vein through the popliteal and proximal calf veins. COMPARISON:  None Available. FINDINGS: VENOUS Normal compressibility of the common femoral, superficial femoral, and popliteal veins, as well as the visualized calf veins. Visualized portions of profunda femoral vein and great saphenous vein unremarkable. No filling defects to suggest DVT on grayscale or color Doppler imaging. Doppler waveforms show normal direction of venous flow, normal respiratory plasticity and response to augmentation. Limited views of the contralateral common femoral vein are unremarkable. OTHER None. Limitations: none IMPRESSION: Negative. Electronically Signed   By: Lupita Raider M.D.   On: 10/29/2022 15:38   DG Foot Complete Left  Result Date: 10/27/2022 CLINICAL DATA:  Left foot pain. EXAM: LEFT FOOT - COMPLETE 3+ VIEW COMPARISON:  September 06, 2017. FINDINGS: There is no evidence of fracture or dislocation. There is no evidence of arthropathy or other focal bone abnormality. Vascular calcifications are noted. IMPRESSION: No acute abnormality is noted. Electronically Signed   By: Lupita Raider M.D.   On: 10/27/2022 13:43   DG Chest 2 View  Result Date: 10/27/2022 CLINICAL DATA:  Sepsis. EXAM: CHEST - 2 VIEW COMPARISON:   May 18, 2022. FINDINGS: Stable cardiomediastinal silhouette. Right internal jugular Port-A-Cath is noted with distal tip in expected position of the SVC. Both lungs are clear. The visualized skeletal structures are unremarkable. IMPRESSION: No active cardiopulmonary disease. Electronically Signed   By: Lupita Raider M.D.   On: 10/27/2022 13:42

## 2022-11-12 NOTE — Assessment & Plan Note (Addendum)
 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  NGS molecular studies are pending.  May add immunotherapy if he is eligible. Labs are reviewed and discussed with patient. Proceed with 5-FU +Oxaliplatin 65mg /m2

## 2022-11-12 NOTE — Assessment & Plan Note (Signed)
Hold Eliquis due to GI bleeding/anemia 

## 2022-11-12 NOTE — Assessment & Plan Note (Signed)
Proceed with Venofer treatment today.

## 2022-11-13 ENCOUNTER — Ambulatory Visit: Payer: Medicaid Other

## 2022-11-13 ENCOUNTER — Encounter: Payer: Self-pay | Admitting: Physician Assistant

## 2022-11-13 ENCOUNTER — Ambulatory Visit: Payer: Medicaid Other | Admitting: Physical Therapy

## 2022-11-13 ENCOUNTER — Other Ambulatory Visit: Payer: Medicaid Other | Admitting: Pharmacist

## 2022-11-13 NOTE — Patient Instructions (Signed)
Please follow up with your [facility] to pick up your prescription for Farxiga.  Thank you!  Estelle Grumbles, PharmD, [facility] [phone]

## 2022-11-13 NOTE — Progress Notes (Signed)
   11/13/2022  Patient ID: Jeremiah Vance, male   DOB: 17-Feb-1971, 52 y.o.   MRN: [id number]  Jeremiah Vance is a 52 y.o. year old male was referred to the pharmacist by their PCP for assistance in managing medication access.    Follow up with patient today regarding medication access for Farxiga.  Patient confirms that he contacted Managed Medicaid [employer] member services to let his plan know that he no longer has commercial coverage.   From review of chart, note office has now submitted prior authorization for Farxiga to patient's Managed Medicaid [employer] coverage, but prior authorization (PA) was denied   Contact Medicaid [employer] at today. Find that original PA submitted did not include documentation of patient's heart failure diagnosis or of his previous trial of metformin. Re-submit PA today verbally with this information for re-review.  - Follow up with Medicaid [employer] PA line in afternoon and find PA now approved for 1 year - Follow up with [facility] to confirm prescription now going through plan - Follow up with patient to provide update     Follow Up Plan:    Patient denies further medication questions or concerns today Provide patient with contact information for clinic pharmacist to contact if needed in future for medication questions/concerns     Estelle Grumbles, PharmD, [facility] [phone]

## 2022-11-14 ENCOUNTER — Inpatient Hospital Stay: Payer: Medicaid Other

## 2022-11-14 VITALS — BP 158/97 | HR 68 | Temp 96.3°F | Resp 18

## 2022-11-14 DIAGNOSIS — C160 Malignant neoplasm of cardia: Secondary | ICD-10-CM

## 2022-11-14 DIAGNOSIS — D509 Iron deficiency anemia, unspecified: Secondary | ICD-10-CM | POA: Diagnosis not present

## 2022-11-14 DIAGNOSIS — Z5111 Encounter for antineoplastic chemotherapy: Secondary | ICD-10-CM | POA: Diagnosis not present

## 2022-11-14 MED ORDER — SODIUM CHLORIDE 0.9% FLUSH
10.0000 mL | INTRAVENOUS | Status: DC | PRN
  Administered 2022-11-14: 10 mL
  Filled 2022-11-14: qty 10

## 2022-11-14 MED ORDER — HEPARIN SOD (PORK) LOCK FLUSH 100 UNIT/ML IV SOLN
500.0000 [IU] | Freq: Once | INTRAVENOUS | Status: AC | PRN
  Administered 2022-11-14: 500 [IU]
  Filled 2022-11-14: qty 5

## 2022-11-18 ENCOUNTER — Encounter: Payer: Self-pay | Admitting: Physician Assistant

## 2022-11-18 ENCOUNTER — Ambulatory Visit: Payer: Medicaid Other | Admitting: Physical Therapy

## 2022-11-18 ENCOUNTER — Ambulatory Visit: Payer: Medicaid Other

## 2022-11-20 ENCOUNTER — Ambulatory Visit: Payer: Medicaid Other

## 2022-11-20 ENCOUNTER — Ambulatory Visit: Payer: Medicaid Other | Admitting: Physical Therapy

## 2022-11-21 ENCOUNTER — Inpatient Hospital Stay
Admission: EM | Admit: 2022-11-21 | Discharge: 2022-11-27 | DRG: 065 | Disposition: A | Discharge status: Rehabilitation Facility | Payer: Medicaid Other | Attending: Internal Medicine | Admitting: Internal Medicine

## 2022-11-21 ENCOUNTER — Other Ambulatory Visit: Payer: Self-pay

## 2022-11-21 ENCOUNTER — Emergency Department: Payer: Medicaid Other

## 2022-11-21 DIAGNOSIS — I130 Hypertensive heart and chronic kidney disease with heart failure and stage 1 through stage 4 chronic kidney disease, or unspecified chronic kidney disease: Secondary | ICD-10-CM | POA: Diagnosis present

## 2022-11-21 DIAGNOSIS — I5043 Acute on chronic combined systolic (congestive) and diastolic (congestive) heart failure: Secondary | ICD-10-CM | POA: Diagnosis present

## 2022-11-21 DIAGNOSIS — I69354 Hemiplegia and hemiparesis following cerebral infarction affecting left non-dominant side: Secondary | ICD-10-CM

## 2022-11-21 DIAGNOSIS — I10 Essential (primary) hypertension: Secondary | ICD-10-CM | POA: Diagnosis present

## 2022-11-21 DIAGNOSIS — E785 Hyperlipidemia, unspecified: Secondary | ICD-10-CM | POA: Diagnosis present

## 2022-11-21 DIAGNOSIS — E7800 Pure hypercholesterolemia, unspecified: Secondary | ICD-10-CM | POA: Diagnosis present

## 2022-11-21 DIAGNOSIS — I5042 Chronic combined systolic (congestive) and diastolic (congestive) heart failure: Secondary | ICD-10-CM | POA: Diagnosis present

## 2022-11-21 DIAGNOSIS — Z91048 Other nonmedicinal substance allergy status: Secondary | ICD-10-CM

## 2022-11-21 DIAGNOSIS — H548 Legal blindness, as defined in USA: Secondary | ICD-10-CM | POA: Diagnosis present

## 2022-11-21 DIAGNOSIS — Z7901 Long term (current) use of anticoagulants: Secondary | ICD-10-CM

## 2022-11-21 DIAGNOSIS — I6381 Other cerebral infarction due to occlusion or stenosis of small artery: Secondary | ICD-10-CM | POA: Diagnosis not present

## 2022-11-21 DIAGNOSIS — E1129 Type 2 diabetes mellitus with other diabetic kidney complication: Secondary | ICD-10-CM | POA: Diagnosis present

## 2022-11-21 DIAGNOSIS — G890 Central pain syndrome: Secondary | ICD-10-CM | POA: Diagnosis present

## 2022-11-21 DIAGNOSIS — R4701 Aphasia: Secondary | ICD-10-CM | POA: Diagnosis present

## 2022-11-21 DIAGNOSIS — Z833 Family history of diabetes mellitus: Secondary | ICD-10-CM

## 2022-11-21 DIAGNOSIS — I480 Paroxysmal atrial fibrillation: Secondary | ICD-10-CM | POA: Diagnosis present

## 2022-11-21 DIAGNOSIS — Z9221 Personal history of antineoplastic chemotherapy: Secondary | ICD-10-CM

## 2022-11-21 DIAGNOSIS — E669 Obesity, unspecified: Secondary | ICD-10-CM | POA: Diagnosis present

## 2022-11-21 DIAGNOSIS — G936 Cerebral edema: Secondary | ICD-10-CM | POA: Diagnosis not present

## 2022-11-21 DIAGNOSIS — F418 Other specified anxiety disorders: Secondary | ICD-10-CM | POA: Diagnosis present

## 2022-11-21 DIAGNOSIS — Z6835 Body mass index (BMI) 35.0-35.9, adult: Secondary | ICD-10-CM

## 2022-11-21 DIAGNOSIS — Z818 Family history of other mental and behavioral disorders: Secondary | ICD-10-CM

## 2022-11-21 DIAGNOSIS — R29818 Other symptoms and signs involving the nervous system: Secondary | ICD-10-CM | POA: Diagnosis not present

## 2022-11-21 DIAGNOSIS — F32A Depression, unspecified: Secondary | ICD-10-CM | POA: Diagnosis present

## 2022-11-21 DIAGNOSIS — I5023 Acute on chronic systolic (congestive) heart failure: Secondary | ICD-10-CM | POA: Diagnosis present

## 2022-11-21 DIAGNOSIS — Z713 Dietary counseling and surveillance: Secondary | ICD-10-CM

## 2022-11-21 DIAGNOSIS — R29706 NIHSS score 6: Secondary | ICD-10-CM | POA: Diagnosis present

## 2022-11-21 DIAGNOSIS — M109 Gout, unspecified: Secondary | ICD-10-CM | POA: Diagnosis present

## 2022-11-21 DIAGNOSIS — N1832 Chronic kidney disease, stage 3b: Secondary | ICD-10-CM | POA: Diagnosis present

## 2022-11-21 DIAGNOSIS — G9389 Other specified disorders of brain: Secondary | ICD-10-CM | POA: Diagnosis present

## 2022-11-21 DIAGNOSIS — C160 Malignant neoplasm of cardia: Secondary | ICD-10-CM | POA: Diagnosis not present

## 2022-11-21 DIAGNOSIS — R202 Paresthesia of skin: Secondary | ICD-10-CM | POA: Diagnosis not present

## 2022-11-21 DIAGNOSIS — D500 Iron deficiency anemia secondary to blood loss (chronic): Secondary | ICD-10-CM | POA: Diagnosis present

## 2022-11-21 DIAGNOSIS — Z5982 Transportation insecurity: Secondary | ICD-10-CM

## 2022-11-21 DIAGNOSIS — K7460 Unspecified cirrhosis of liver: Secondary | ICD-10-CM | POA: Diagnosis present

## 2022-11-21 DIAGNOSIS — Z5986 Financial insecurity: Secondary | ICD-10-CM

## 2022-11-21 DIAGNOSIS — I639 Cerebral infarction, unspecified: Secondary | ICD-10-CM | POA: Diagnosis not present

## 2022-11-21 DIAGNOSIS — E1122 Type 2 diabetes mellitus with diabetic chronic kidney disease: Secondary | ICD-10-CM | POA: Diagnosis present

## 2022-11-21 DIAGNOSIS — I771 Stricture of artery: Secondary | ICD-10-CM | POA: Diagnosis not present

## 2022-11-21 DIAGNOSIS — I6389 Other cerebral infarction: Principal | ICD-10-CM | POA: Diagnosis present

## 2022-11-21 DIAGNOSIS — F419 Anxiety disorder, unspecified: Secondary | ICD-10-CM | POA: Diagnosis present

## 2022-11-21 DIAGNOSIS — Z8249 Family history of ischemic heart disease and other diseases of the circulatory system: Secondary | ICD-10-CM

## 2022-11-21 DIAGNOSIS — G4733 Obstructive sleep apnea (adult) (pediatric): Secondary | ICD-10-CM | POA: Diagnosis present

## 2022-11-21 DIAGNOSIS — Z882 Allergy status to sulfonamides status: Secondary | ICD-10-CM

## 2022-11-21 DIAGNOSIS — Z823 Family history of stroke: Secondary | ICD-10-CM

## 2022-11-21 DIAGNOSIS — I6782 Cerebral ischemia: Secondary | ICD-10-CM | POA: Diagnosis not present

## 2022-11-21 DIAGNOSIS — I69398 Other sequelae of cerebral infarction: Secondary | ICD-10-CM

## 2022-11-21 LAB — COMPREHENSIVE METABOLIC PANEL
ALT: 16 U/L (ref 0–44)
AST: 16 U/L (ref 15–41)
Albumin: 3.3 g/dL — ABNORMAL LOW (ref 3.5–5.0)
Alkaline Phosphatase: 88 U/L (ref 38–126)
Anion gap: 5 (ref 5–15)
BUN: 41 mg/dL — ABNORMAL HIGH (ref 6–20)
CO2: 27 mmol/L (ref 22–32)
Calcium: 8.3 mg/dL — ABNORMAL LOW (ref 8.9–10.3)
Chloride: 103 mmol/L (ref 98–111)
Creatinine, Ser: 1.51 mg/dL — ABNORMAL HIGH (ref 0.61–1.24)
GFR, Estimated: 55 mL/min — ABNORMAL LOW (ref >60)
Glucose, Bld: 157 mg/dL — ABNORMAL HIGH (ref 70–99)
Potassium: 3.6 mmol/L (ref 3.5–5.1)
Sodium: 135 mmol/L (ref 135–145)
Total Bilirubin: 0.9 mg/dL (ref 0.3–1.2)
Total Protein: 7.2 g/dL (ref 6.5–8.1)

## 2022-11-21 LAB — CBC WITH DIFFERENTIAL/PLATELET
Abs Immature Granulocytes: 0.04 10*3/uL (ref 0.00–0.07)
Basophils Absolute: 0 10*3/uL (ref 0.0–0.1)
Basophils Relative: 0 %
Eosinophils Absolute: 0.1 10*3/uL (ref 0.0–0.5)
Eosinophils Relative: 1 %
HCT: 31.9 % — ABNORMAL LOW (ref 39.0–52.0)
Hemoglobin: 10.4 g/dL — ABNORMAL LOW (ref 13.0–17.0)
Immature Granulocytes: 1 %
Lymphocytes Relative: 14 %
Lymphs Abs: 0.9 10*3/uL (ref 0.7–4.0)
MCH: 27 pg (ref 26.0–34.0)
MCHC: 32.6 g/dL (ref 30.0–36.0)
MCV: 82.9 fL (ref 80.0–100.0)
Monocytes Absolute: 1 10*3/uL (ref 0.1–1.0)
Monocytes Relative: 15 %
Neutro Abs: 4.5 10*3/uL (ref 1.7–7.7)
Neutrophils Relative %: 69 %
Platelets: 248 10*3/uL (ref 150–400)
RBC: 3.85 MIL/uL — ABNORMAL LOW (ref 4.22–5.81)
RDW: 19.3 % — ABNORMAL HIGH (ref 11.5–15.5)
WBC: 6.4 10*3/uL (ref 4.0–10.5)
nRBC: 0 % (ref 0.0–0.2)

## 2022-11-21 LAB — BRAIN NATRIURETIC PEPTIDE: B Natriuretic Peptide: 239.9 pg/mL — ABNORMAL HIGH (ref 0.0–100.0)

## 2022-11-21 LAB — PHOSPHORUS: Phosphorus: 2.9 mg/dL (ref 2.5–4.6)

## 2022-11-21 LAB — CBG MONITORING, ED: Glucose-Capillary: 126 mg/dL — ABNORMAL HIGH (ref 70–99)

## 2022-11-21 LAB — MAGNESIUM: Magnesium: 1.6 mg/dL — ABNORMAL LOW (ref 1.7–2.4)

## 2022-11-21 MED ORDER — ACETAMINOPHEN 325 MG PO TABS: 650.0000 mg | ORAL_TABLET | ORAL | Status: DC | PRN

## 2022-11-21 MED ORDER — MAGNESIUM SULFATE 2 GM/50ML IV SOLN
2.0000 g | Freq: Once | INTRAVENOUS | Status: AC
  Administered 2022-11-21: 2 g via INTRAVENOUS
  Filled 2022-11-21: qty 50

## 2022-11-21 MED ORDER — STROKE: EARLY STAGES OF RECOVERY BOOK: Freq: Once | Status: AC

## 2022-11-21 MED ORDER — ONDANSETRON HCL 4 MG/2ML IJ SOLN: 4.0000 mg | Freq: Three times a day (TID) | INTRAMUSCULAR | Status: DC | PRN

## 2022-11-21 MED ORDER — ACETAMINOPHEN 160 MG/5ML PO SOLN: 650.0000 mg | ORAL | Status: DC | PRN

## 2022-11-21 MED ORDER — INSULIN ASPART 100 UNIT/ML IJ SOLN
0.0000 [IU] | Freq: Three times a day (TID) | INTRAMUSCULAR | Status: DC
  Administered 2022-11-22: 2 [IU] via SUBCUTANEOUS
  Administered 2022-11-22: 1 [IU] via SUBCUTANEOUS
  Administered 2022-11-23: 2 [IU] via SUBCUTANEOUS
  Administered 2022-11-23: 1 [IU] via SUBCUTANEOUS
  Administered 2022-11-24 – 2022-11-26 (×4): 2 [IU] via SUBCUTANEOUS
  Administered 2022-11-26: 1 [IU] via SUBCUTANEOUS
  Filled 2022-11-21 (×8): qty 1

## 2022-11-21 MED ORDER — SENNOSIDES-DOCUSATE SODIUM 8.6-50 MG PO TABS
1.0000 | ORAL_TABLET | Freq: Every evening | ORAL | Status: DC | PRN
  Administered 2022-11-23: 1 via ORAL
  Filled 2022-11-21: qty 1

## 2022-11-21 MED ORDER — GADOBUTROL 1 MMOL/ML IV SOLN
10.0000 mL | Freq: Once | INTRAVENOUS | Status: AC | PRN
  Administered 2022-11-21: 10 mL via INTRAVENOUS

## 2022-11-21 MED ORDER — INSULIN ASPART 100 UNIT/ML IJ SOLN
0.0000 [IU] | Freq: Every day | INTRAMUSCULAR | Status: DC
  Filled 2022-11-21: qty 1

## 2022-11-21 MED ORDER — HYDRALAZINE HCL 20 MG/ML IJ SOLN: 5.0000 mg | INTRAMUSCULAR | Status: DC | PRN

## 2022-11-21 MED ORDER — ACETAMINOPHEN 650 MG RE SUPP: 650.0000 mg | RECTAL | Status: DC | PRN

## 2022-11-21 NOTE — Plan of Care (Signed)
Head CT reviewed, agree with radiology Compared to prior exam there is new cytotoxic edema in the left frontal lobe with associated blood products. This is favored to represent an acute to subacute infarct with likely petechial hemorrhage. No definite evidence of a parenchymal hematoma  Initial recommendations after head CT: Recommend MRI brain with and without contrast, MRA head without contrast, MRA neck with and without contrast Agree with ED provider that this left frontal lobe lesion does not explain the patient's reported left arm and leg numbness  MRI brain radiology report 1. Subacute infarct in the left frontal operculum with petechial hemorrhage and mild surrounding edema. No significant mass effect or midline shift. 2. No large vessel occlusion, hemodynamically significant stenosis, or aneurysm in the head or neck.  Discussed with radiology --again, history as provided to me from ED physician is not consistent with a fairly large new left frontal infarct.  Some elements of the edema look more vasogenic and therefore and concerned about the possibility of metastatic disease versus subacute infarct, though I agree there is no significant enhancement.   Patient will need full evaluation tomorrow.  If history and examination are more consistent with infarct will need discussion with oncology/GI about whether or not anticoagulation can be resumed noting that malignancy is often a hypercoagulable state as well, or if at least aspirin may be used.  If history and examination are more consistent with metastasis, will likely need outpatient PET/CT.  In the interim okay to initiate other aspects of stroke workup as below  # Subacute stroke versus possible metastatic disease - HgbA1c, fasting lipid panel - Echocardiogram - Aspirin versus anticoagulation pending full neurology evaluation and discussion with oncology/GI - Blood pressure goal   - Normotension as he is out of the window for  permissive hypertension - PT consult, OT consult, Speech consult, unless patient is back to baseline - Full neurological consult to follow tomorrow

## 2022-11-21 NOTE — ED Provider Notes (Signed)
[facility] Provider Note    Event Date/Time   First MD Initiated Contact with Patient 11/21/22 1500     (approximate)   History    HPI  Jaymar D Kromer is a 52 y.o. 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  The patient states that he woke up with the symptoms.  He reports numbness, tingling, and pain mainly to the forearm and hand as well as to the lower leg and foot on the left side.  He has no numbness or tingling in his face.  He denies any vision changes, difficulty speaking, or any weakness.  He denies feeling lightheaded or dizzy, has no nausea or vomiting, headache, chest pain, or difficulty breathing.  He does not remember what the symptoms were when he had a stroke previously.  I reviewed the past medical records.  The patient's was most recently admitted to the hospital service in June of this year with a hemoglobin of 7.3 due to bleeding from his cancer.  His Eliquis was held at that time.   Physical Exam   Triage Vital Signs: ED Triage Vitals  Encounter Vitals Group     BP 11/21/22 1444 133/86     Systolic BP Percentile --      Diastolic BP Percentile --      Pulse Rate 11/21/22 1444 92     Resp 11/21/22 1444 18     Temp 11/21/22 1443 98.2 F (36.8 C)     Temp src --      SpO2 11/21/22 1444 95 %     Weight --      Height --      Head Circumference --      Peak Flow --      Pain Score 11/21/22 1443 8     Pain Loc --      Pain Education --      Exclude from Growth Chart --     Most recent vital signs: Vitals:   11/21/22 2018 11/21/22 2030  BP: (!) 171/82 (!) 157/113  Pulse: 83 (!) 102  Resp:  18  Temp:    SpO2: 100% 94%     General: Alert, no distress.  CV:  Good peripheral perfusion.  Resp:  Normal effort.  Abd:  No distention.   Other:  EOMI.  PERRLA.  No photophobia.  No facial droop.  Cranial nerves III through XII grossly intact.  No ataxia.  No pronator drift.  5/5 motor strength to bilateral upper and lower extremities.  Decreased sensation to distal left arm and hand and distal left leg and foot.  1+ bilateral lower extremity edema and venous stasis type changes.   ED Results / Procedures / Treatments   Labs (all labs ordered are listed, but only abnormal results are displayed) Labs Reviewed  CBC WITH DIFFERENTIAL/PLATELET - Abnormal; Notable for the following components:      Result Value   RBC 3.85 (*)    Hemoglobin 10.4 (*)    HCT 31.9 (*)    RDW 19.3 (*)    All other components within normal limits  COMPREHENSIVE METABOLIC PANEL - Abnormal; Notable for the following components:   Glucose, Bld 157 (*)    BUN 41 (*)    Creatinine, Ser 1.51 (*)  Calcium 8.3 (*)    Albumin 3.3 (*)    GFR, Estimated 55 (*)    All other components within normal limits  MAGNESIUM - Abnormal; Notable for the following components:   Magnesium 1.6 (*)    All other components within normal limits  PHOSPHORUS  BRAIN NATRIURETIC PEPTIDE     EKG  ED ECG REPORT I, Dionne Bucy, the attending physician, personally viewed and interpreted this ECG.  Date: 11/21/2022 EKG Time: 1619 Rate: 84 Rhythm: Atrial fibrillation QRS Axis: normal Intervals: Borderline prolonged QTc ST/T Wave abnormalities: Nonspecific T wave abnormalities Narrative Interpretation: no evidence of acute ischemia    RADIOLOGY  CT head: I independently viewed and interpreted the images; there is a left frontal infarct with some hemorrhage.  Radiology report indicates likely acute to subacute infarct with petechial hemorrhage.  MR brain, MRA head/neck:  IMPRESSION:  1. Subacute infarct in the left frontal operculum with petechial  hemorrhage and mild surrounding edema. No significant mass effect or  midline shift.  2. No large vessel  occlusion, hemodynamically significant stenosis,  or aneurysm in the head or neck.     PROCEDURES:  Critical Care performed: No  Procedures   MEDICATIONS ORDERED IN ED: Medications  hydrALAZINE (APRESOLINE) injection 5 mg (has no administration in time range)  ondansetron (ZOFRAN) injection 4 mg (has no administration in time range)  gadobutrol (GADAVIST) 1 MMOL/ML injection 10 mL (10 mLs Intravenous Contrast Given 11/21/22 1716)     IMPRESSION / MDM / ASSESSMENT AND PLAN / ED COURSE  I reviewed the triage vital signs and the nursing notes.  52 year old male with PMH as noted above presents with numbness, tingling, and pain to the distal left arm and left leg that he awoke with today around 9:30 AM.  On exam, his vital signs are normal.  He is alert, and his neuroexam reveals decreased sensation to these areas but no motor deficits or other focal findings.  Differential diagnosis includes, but is not limited to, peripheral neuropathy, CVA, TIA, complex migraine, electrolyte abnormality, other metabolic disturbance.  Overall I have a low suspicion for acute CVA given the distribution of the symptoms and the presence of pain.  Given the timing and being on Eliquis, the patient is out of the window for any intervention.  We will obtain CT head, lab workup, consult neurology, and reassess.  Patient's presentation is most consistent with acute presentation with potential threat to life or bodily function.  The patient is on the cardiac monitor to evaluate for evidence of arrhythmia and/or significant heart rate changes.  ----------------------------------------- 4:35 PM on 11/21/2022 -----------------------------------------  CT shows likely acute stroke with petechial hemorrhage although it is on the left which does not make sense with the patient's symptoms, and metastasis is also on the differential.  I consulted and discussed the case with Dr. Iver Nestle from neurology who recommends  obtaining MRI and MRA for further evaluation and to determine appropriate disposition.  ----------------------------------------- 8:55 PM on 11/21/2022 -----------------------------------------  MRI shows evidence of subacute infarct confirming the CT findings.  I once again discussed case with Dr. Iver Nestle who recommends inpatient admission to the hospitalist service and neurology will plan to evaluate in the morning.  I then consulted Dr. Clyde Lundborg from the hospitalist service; based our discussion he agrees to evaluate the patient for admission.   FINAL CLINICAL IMPRESSION(S) / ED DIAGNOSES   Final diagnoses:  Cerebrovascular accident (CVA), unspecified mechanism (HCC)     Rx / DC Orders  ED Discharge Orders     None        Note:  This document was prepared using Dragon voice recognition software and may include unintentional dictation errors.    Dionne Bucy, MD 11/21/22 2059

## 2022-11-21 NOTE — ED Notes (Signed)
First Nurse Note: Patient to ED via [facility] from home for numbness on his entire body- but worse on the left side. Hx of stroke with deficits on the left side. [facility] 0930 today. VS WNL with EMS.

## 2022-11-21 NOTE — H&P (Addendum)
History and Physical    Jeremiah Vance ZOX:[id number] DOB: July 16, 1970 DOA: 11/21/2022  Referring MD/NP/PA:   PCP: Debera Lat, PA-C   Patient coming from:  The patient is coming from home.     Chief Complaint: Left-sided numbness  HPI: Jeremiah Vance is a 52 y.o. male with medical history significant of GE junction carcinoma, HTN, HLD, DM, sCHF with EF 30-35%, stroke with mild left sided weakness, depression with anxiety, A-fib not on anticoagulants, obesity, who presents with left-sided numbness.  Patient states that he has history of stroke with mild left-sided weakness.  At about 9:30, he started feeling left-sided numbness.  His weakness in right arm and leg has not changed.  No vision change or hearing loss.  Patient denies chest pain, cough, SOB.  No fever or chills.  Denies symptoms of UTI.  No nausea, vomiting, diarrhea or abdominal pain.  Denies rectal bleeding or dark stool.   Data reviewed independently and ED Course: pt was found to have WBC 6.4, BNP 239.9,  worsening renal function, magnesium of 1.6, potassium 3.6, phosphorus of 2.9, temperature normal, blood pressure 157/113, heart rate of 102, RR 18, oxygen saturation 100% on room air. MRA of head and neck is negative for LVO.  Patient is placed on telemetry bed for obs.  Dr. Iver Nestle of neuro is consulted.   CT-head: Compared to prior exam there is new cytotoxic edema in the left frontal lobe with associated blood products. This is favored to represent an acute to subacute infarct with likely petechial hemorrhage. No definite evidence of a parenchymal hematoma  MR_brain with and without contrast: 1. Subacute infarct in the left frontal operculum with petechial hemorrhage and mild surrounding edema. No significant mass effect or midline shift. 2. No large vessel occlusion, hemodynamically significant stenosis, or aneurysm in the head or neck.    EKG: I have personally reviewed.  A-fib, QTc 498, nonspecific T wave  change   Review of Systems:   General: no fevers, chills, no body weight gain, has fatigue HEENT: no blurry vision, hearing changes or sore throat Respiratory: no dyspnea, coughing, wheezing CV: no chest pain, no palpitations GI: no nausea, vomiting, abdominal pain, diarrhea, constipation GU: no dysuria, burning on urination, increased urinary frequency, hematuria  Ext: has leg edema Neuro:  no vision change or hearing loss. Has chronic left-sided weakness, has new left-sided numbness Skin: no rash, no skin tear. MSK: No muscle spasm, no deformity, no limitation of range of movement in spin Heme: No easy bruising.  Travel history: No recent long distant travel.   Allergy:  Allergies  Allergen Reactions   Pollen Extract Other (See Comments)   Sulfa Antibiotics     Past Medical History:  Diagnosis Date   Acute ischemic stroke (HCC) 09/06/2017   Acute renal failure superimposed on stage 3a chronic kidney disease (HCC) 07/08/2019   Acute right-sided weakness    Allergies    Anasarca    Anemia 07/10/2017   Arthritis    Cancer (HCC)    stomach and esophageal cancer stage 4   CHF (congestive heart failure) (HCC)    "coinsided w/kidney problems I was having 06/2017"   Chicken pox    CKD (chronic kidney disease) stage 3, GFR 30-59 ml/min (HCC) 06/2017   Depression    Elevated troponin 07/08/2019   High cholesterol    History of cardiomyopathy    LVEF 40 to 45% in April 2019 - subsequently normalized   Hyperbilirubinemia 07/10/2017   Hypertension  Ischemic stroke (HCC)    Small left internal capsule infarct due to lacunar disease   Morbid obesity (HCC)    Normocytic anemia 12/16/2017   Recurrent incisional hernia with incarceration s/p repair 10/22/2017 10/21/2017   Stroke (HCC) 08/2017   "right sided weakness since; getting stronger though" (10/22/2017)   Type 2 diabetes mellitus [facility])     Past Surgical History:  Procedure Laterality Date   ABDOMINAL HERNIA REPAIR   2008; 10/22/2017   "scope; OPEN REPAIR INCARCERATED VENTRAL HERNIA   ESOPHAGOGASTRODUODENOSCOPY (EGD) WITH PROPOFOL N/A 09/10/2022   Procedure: ESOPHAGOGASTRODUODENOSCOPY (EGD) WITH PROPOFOL;  Surgeon: Toney Reil, MD;  Location: [facility] ENDOSCOPY;  Service: Gastroenterology;  Laterality: N/A;   ESOPHAGOGASTRODUODENOSCOPY (EGD) WITH PROPOFOL N/A 09/28/2022   Procedure: ESOPHAGOGASTRODUODENOSCOPY (EGD) WITH PROPOFOL;  Surgeon: Jaynie Collins, DO;  Location: [facility] ENDOSCOPY;  Service: Gastroenterology;  Laterality: N/A;   HEMOSTASIS CONTROL  09/28/2022   Procedure: HEMOSTASIS CONTROL;  Surgeon: Jaynie Collins, DO;  Location: [facility] ENDOSCOPY;  Service: Gastroenterology;;   HERNIA REPAIR     KNEE ARTHROSCOPY Right 1989   PORTA CATH INSERTION N/A 09/25/2022   Procedure: PORTA CATH INSERTION;  Surgeon: Annice Needy, MD;  Location: [facility] INVASIVE CV LAB;  Service: Cardiovascular;  Laterality: N/A;   VENTRAL HERNIA REPAIR N/A 10/22/2017   Procedure: OPEN REPAIR INCARCERATED VENTRAL HERNIA;  Surgeon: Glenna Fellows, MD;  Location: [facility] OR;  Service: General;  Laterality: N/A;    Social History:  reports that he has never smoked. He has never used smokeless tobacco. He reports current alcohol use. He reports that he does not use drugs.  Family History:  Family History  Problem Relation Age of Onset   Diabetes Mother    Stroke Mother    Arthritis Mother    Depression Mother    Heart disease Mother    Hypertension Mother    Learning disabilities Mother    Mental illness Mother    Sleep apnea Mother    Diabetes Father    Heart disease Father    Arthritis Father    Hearing loss Father    Hyperlipidemia Father    Heart attack Father    Hypertension Father    Stroke Father    Diabetes Sister    Depression Sister    Diabetes Sister    Hypertension Sister    Mental illness Sister    Sleep apnea Sister    Diabetes Maternal Grandmother    Heart disease Maternal Grandmother     Depression Maternal Grandmother    Hyperlipidemia Maternal Grandmother    Hypertension Maternal Grandmother    Stroke Maternal Grandmother    Hypertension Maternal Grandfather    Hyperlipidemia Maternal Grandfather    Stroke Maternal Grandfather    Arthritis Paternal Grandmother    Hearing loss Paternal Grandmother    Stroke Paternal Grandfather    Heart disease Paternal Grandfather    Arthritis Paternal Grandfather    Heart attack Paternal Grandfather    Melanoma Other      Prior to Admission medications   Medication Sig Start Date End Date Taking? Authorizing Provider  atorvastatin (LIPITOR) 40 MG tablet Take 1 tablet (40 mg total) by mouth daily at 6 PM. 10/23/22   Ostwalt, Edmon Crape, PA-C  busPIRone (BUSPAR) 7.5 MG tablet Take 1 tablet (7.5 mg total) by mouth 2 (two) times daily. 10/23/22   Ostwalt, Edmon Crape, PA-C  Continuous Glucose Sensor (FREESTYLE LIBRE 2 SENSOR) MISC Place 1 sensor on the skin every 14 days. Use to  check glucose continuously 10/23/22   Debera Lat, PA-C  dapagliflozin propanediol (FARXIGA) 10 MG TABS tablet Take 1 tablet (10 mg total) by mouth daily. Take 1 tablet (10 mg) by mouth once daily/please contact office to schedule follow up prior to further refills. Patient not taking: Reported on 11/12/2022 10/29/22   Jacky Kindle, FNP  dexamethasone (DECADRON) 4 MG tablet Take 2 tablets (8 mg total) by mouth daily. Start the day after chemotherapy for 2 days. Take with food. 09/19/22   Rickard Patience, MD  gabapentin (NEURONTIN) 100 MG capsule Take 1 capsule (100 mg total) by mouth 2 (two) times daily. 10/29/22   Rickard Patience, MD  ondansetron (ZOFRAN) 8 MG tablet Take 8 mg by mouth every 8 (eight) hours as needed for nausea or vomiting.    [provider]  ondansetron (ZOFRAN-ODT) 8 MG disintegrating tablet Take 1 tablet (8 mg total) by mouth every 8 (eight) hours as needed for nausea or vomiting. 10/10/22   Rickard Patience, MD  oxyCODONE (OXY IR/ROXICODONE) 5 MG immediate release  tablet Take 5 mg by mouth every 4 (four) hours as needed for severe pain.    [provider]  pantoprazole (PROTONIX) 40 MG tablet Take 1 tablet (40 mg total) by mouth 2 (two) times daily before a meal. 10/29/22   Rickard Patience, MD  prochlorperazine (COMPAZINE) 10 MG tablet Take 1 tablet (10 mg total) by mouth every 6 (six) hours as needed for nausea or vomiting. 10/10/22   Rickard Patience, MD  scopolamine (TRANSDERM-SCOP) 1 MG/3DAYS Place 1 patch (1.5 mg total) onto the skin every 3 (three) days. 09/29/22   Sunnie Nielsen, DO  sertraline (ZOLOFT) 50 MG tablet Take 1 tablet (50 mg total) by mouth daily. 10/29/22   Jacky Kindle, FNP  torsemide (DEMADEX) 20 MG tablet Take 2 tablets (40 mg total) by mouth daily as needed (fluid/edema). 09/29/22   Sunnie Nielsen, DO    Physical Exam: Vitals:   11/21/22 1756 11/21/22 1800 11/21/22 2018 11/21/22 2030  BP: (!) 184/82 (!) 152/90 (!) 171/82 (!) 157/113  Pulse: 75 87 83 (!) 102  Resp: 18   18  Temp:      SpO2: 99% 100% 100% 94%   General: Not in acute distress HEENT:       Eyes: PERRL, EOMI, no jaundice       ENT: No discharge from the ears and nose, no pharynx injection, no tonsillar enlargement.        Neck: No JVD, no bruit, no mass felt. Heme: No neck lymph node enlargement. Cardiac: S1/S2, RRR, No murmurs, No gallops or rubs. Respiratory: No rales, wheezing, rhonchi or rubs. GI: Soft, nondistended, nontender, no rebound pain, no organomegaly, BS present. GU: No hematuria Ext: has 1+ pitting leg edema bilaterally. 1+DP/PT pulse bilaterally. Musculoskeletal: No joint deformities, No joint redness or warmth, no limitation of ROM in spin. Skin: No rashes.  Neuro: Alert, oriented X3, cranial nerves II-XII grossly intact, moves all extremities normally. Muscle strength 4/5 in left arm leg and 5/5 in right extremities. Sensation to light touch intact in decreased in left hand. Psych: Patient is not psychotic, no suicidal or hemocidal  ideation.  Labs on Admission: I have personally reviewed following labs and imaging studies  CBC: Recent Labs  Lab 11/21/22 1450  WBC 6.4  NEUTROABS 4.5  HGB 10.4*  HCT 31.9*  MCV 82.9  PLT 248   Basic Metabolic Panel: Recent Labs  Lab 11/21/22 1450  NA 135  K  3.6  CL 103  CO2 27  GLUCOSE 157*  BUN 41*  CREATININE 1.51*  CALCIUM 8.3*  MG 1.6*  PHOS 2.9   GFR: Estimated Creatinine Clearance: 71.7 mL/min (A) (by C-G formula based on SCr of 1.51 mg/dL (H)). Liver Function Tests: Recent Labs  Lab 11/21/22 1450  AST 16  ALT 16  ALKPHOS 88  BILITOT 0.9  PROT 7.2  ALBUMIN 3.3*   No results for input(s): "LIPASE", "AMYLASE" in the last 168 hours. No results for input(s): "AMMONIA" in the last 168 hours. Coagulation Profile: No results for input(s): "INR", "PROTIME" in the last 168 hours. Cardiac Enzymes: No results for input(s): "CKTOTAL", "CKMB", "CKMBINDEX", "TROPONINI" in the last 168 hours. BNP (last 3 results) Recent Labs    11/28/21 1520  PROBNP 966*   HbA1C: No results for input(s): "HGBA1C" in the last 72 hours. CBG: Recent Labs  Lab 11/21/22 2118  GLUCAP 126*   Lipid Profile: No results for input(s): "CHOL", "HDL", "LDLCALC", "TRIG", "CHOLHDL", "LDLDIRECT" in the last 72 hours. Thyroid Function Tests: No results for input(s): "TSH", "T4TOTAL", "FREET4", "T3FREE", "THYROIDAB" in the last 72 hours. Anemia Panel: No results for input(s): "VITAMINB12", "FOLATE", "FERRITIN", "TIBC", "IRON", "RETICCTPCT" in the last 72 hours. Urine analysis:    Component Value Date/Time   COLORURINE STRAW (A) 09/27/2022 1100   APPEARANCEUR CLEAR (A) 09/27/2022 1100   LABSPEC 1.006 09/27/2022 1100   PHURINE 6.0 09/27/2022 1100   GLUCOSEU NEGATIVE 09/27/2022 1100   HGBUR NEGATIVE 09/27/2022 1100   BILIRUBINUR NEGATIVE 09/27/2022 1100   BILIRUBINUR negative 05/19/2020 1618   KETONESUR NEGATIVE 09/27/2022 1100   PROTEINUR NEGATIVE 09/27/2022 1100   UROBILINOGEN  0.2 05/19/2020 1618   UROBILINOGEN 2.0 (H) 10/21/2017 1102   NITRITE NEGATIVE 09/27/2022 1100   LEUKOCYTESUR TRACE (A) 09/27/2022 1100   Sepsis Labs: @LABRCNTIP (procalcitonin:4,lacticidven:4) )No results found for this or any previous visit (from the past 240 hour(s)).   Radiological Exams on Admission: MR Brain W and Wo Contrast  Result Date: 11/21/2022 CLINICAL DATA:  Neuro deficit, acute, stroke suspected. Whole-body numbness, worse on left side. History of stroke with left-sided deficits. EXAM: MRI HEAD WITHOUT AND WITH CONTRAST MRA HEAD WITHOUT CONTRAST MRA NECK WITHOUT AND WITH CONTRAST TECHNIQUE: Multiplanar, multi-echo pulse sequences of the brain and surrounding structures were acquired without and with intravenous contrast. Angiographic images of the Circle of Willis were acquired using MRA technique without intravenous contrast. Angiographic images of the neck were acquired using MRA technique without and with intravenous contrast. Carotid stenosis measurements (when applicable) are obtained utilizing NASCET criteria, using the distal internal carotid diameter as the denominator. 3D post processing, including maximum intensity projections and multiplanar reformations, was performed for better evaluation of the vasculature. CONTRAST:  10mL GADAVIST GADOBUTROL 1 MMOL/ML IV SOLN COMPARISON:  MRI brain 05/20/2022.  Head CT 11/21/2022. FINDINGS: MRI HEAD FINDINGS Brain: Subacute infarct in the left frontal operculum with petechial hemorrhage and mild surrounding edema. No significant mass effect or midline shift. Unchanged background of moderate chronic small-vessel disease with numerous perforator infarcts in the bilateral basal ganglia, thalami and cerebral white matter. Sequela of prior hemorrhage in the right external capsule/corona radiata. Multiple chronic microhemorrhages in the basal ganglia and pons. No hydrocephalus or extra-axial collection. No mass effect or midline shift. Vascular:  Normal flow voids and vessel enhancement. Skull and upper cervical spine: Normal marrow signal and enhancement. Sinuses/Orbits: No acute or significant finding. Other: None. MRA HEAD FINDINGS Anterior circulation: Intracranial ICAs are patent without stenosis or aneurysm.  The proximal ACAs and MCAs are patent without stenosis or aneurysm. Distal branches are symmetric. Posterior circulation: Visualized portions of the distal vertebral arteries and basilar artery are patent without stenosis or aneurysm. The SCAs, AICAs and PICAs are patent proximally. The PCAs are patent proximally without stenosis or aneurysm. Distal branches are symmetric. Anatomic variants: None. MRA NECK FINDINGS Aortic arch: Three-vessel arch configuration. Arch vessel origins are patent. Right carotid system: No evidence of dissection, stenosis (50% or greater), or occlusion. Tortuosity of the right cervical ICA. Left carotid system: No evidence of dissection, stenosis (50% or greater), or occlusion. Tortuosity of the left cervical ICA. Vertebral arteries: Left dominant. No evidence of dissection, stenosis (50% or greater), or occlusion. The right vertebral artery functionally terminates in PICA. Other: None. IMPRESSION: 1. Subacute infarct in the left frontal operculum with petechial hemorrhage and mild surrounding edema. No significant mass effect or midline shift. 2. No large vessel occlusion, hemodynamically significant stenosis, or aneurysm in the head or neck. Electronically Signed   By: Orvan Falconer M.D.   On: 11/21/2022 18:19   MR ANGIO HEAD WO CONTRAST  Result Date: 11/21/2022 CLINICAL DATA:  Neuro deficit, acute, stroke suspected. Whole-body numbness, worse on left side. History of stroke with left-sided deficits. EXAM: MRI HEAD WITHOUT AND WITH CONTRAST MRA HEAD WITHOUT CONTRAST MRA NECK WITHOUT AND WITH CONTRAST TECHNIQUE: Multiplanar, multi-echo pulse sequences of the brain and surrounding structures were acquired without  and with intravenous contrast. Angiographic images of the Circle of Willis were acquired using MRA technique without intravenous contrast. Angiographic images of the neck were acquired using MRA technique without and with intravenous contrast. Carotid stenosis measurements (when applicable) are obtained utilizing NASCET criteria, using the distal internal carotid diameter as the denominator. 3D post processing, including maximum intensity projections and multiplanar reformations, was performed for better evaluation of the vasculature. CONTRAST:  10mL GADAVIST GADOBUTROL 1 MMOL/ML IV SOLN COMPARISON:  MRI brain 05/20/2022.  Head CT 11/21/2022. FINDINGS: MRI HEAD FINDINGS Brain: Subacute infarct in the left frontal operculum with petechial hemorrhage and mild surrounding edema. No significant mass effect or midline shift. Unchanged background of moderate chronic small-vessel disease with numerous perforator infarcts in the bilateral basal ganglia, thalami and cerebral white matter. Sequela of prior hemorrhage in the right external capsule/corona radiata. Multiple chronic microhemorrhages in the basal ganglia and pons. No hydrocephalus or extra-axial collection. No mass effect or midline shift. Vascular: Normal flow voids and vessel enhancement. Skull and upper cervical spine: Normal marrow signal and enhancement. Sinuses/Orbits: No acute or significant finding. Other: None. MRA HEAD FINDINGS Anterior circulation: Intracranial ICAs are patent without stenosis or aneurysm. The proximal ACAs and MCAs are patent without stenosis or aneurysm. Distal branches are symmetric. Posterior circulation: Visualized portions of the distal vertebral arteries and basilar artery are patent without stenosis or aneurysm. The SCAs, AICAs and PICAs are patent proximally. The PCAs are patent proximally without stenosis or aneurysm. Distal branches are symmetric. Anatomic variants: None. MRA NECK FINDINGS Aortic arch: Three-vessel arch  configuration. Arch vessel origins are patent. Right carotid system: No evidence of dissection, stenosis (50% or greater), or occlusion. Tortuosity of the right cervical ICA. Left carotid system: No evidence of dissection, stenosis (50% or greater), or occlusion. Tortuosity of the left cervical ICA. Vertebral arteries: Left dominant. No evidence of dissection, stenosis (50% or greater), or occlusion. The right vertebral artery functionally terminates in PICA. Other: None. IMPRESSION: 1. Subacute infarct in the left frontal operculum with petechial hemorrhage and mild surrounding edema.  No significant mass effect or midline shift. 2. No large vessel occlusion, hemodynamically significant stenosis, or aneurysm in the head or neck. Electronically Signed   By: Orvan Falconer M.D.   On: 11/21/2022 18:19   MR Angiogram Neck W or Wo Contrast  Result Date: 11/21/2022 CLINICAL DATA:  Neuro deficit, acute, stroke suspected. Whole-body numbness, worse on left side. History of stroke with left-sided deficits. EXAM: MRI HEAD WITHOUT AND WITH CONTRAST MRA HEAD WITHOUT CONTRAST MRA NECK WITHOUT AND WITH CONTRAST TECHNIQUE: Multiplanar, multi-echo pulse sequences of the brain and surrounding structures were acquired without and with intravenous contrast. Angiographic images of the Circle of Willis were acquired using MRA technique without intravenous contrast. Angiographic images of the neck were acquired using MRA technique without and with intravenous contrast. Carotid stenosis measurements (when applicable) are obtained utilizing NASCET criteria, using the distal internal carotid diameter as the denominator. 3D post processing, including maximum intensity projections and multiplanar reformations, was performed for better evaluation of the vasculature. CONTRAST:  10mL GADAVIST GADOBUTROL 1 MMOL/ML IV SOLN COMPARISON:  MRI brain 05/20/2022.  Head CT 11/21/2022. FINDINGS: MRI HEAD FINDINGS Brain: Subacute infarct in the left  frontal operculum with petechial hemorrhage and mild surrounding edema. No significant mass effect or midline shift. Unchanged background of moderate chronic small-vessel disease with numerous perforator infarcts in the bilateral basal ganglia, thalami and cerebral white matter. Sequela of prior hemorrhage in the right external capsule/corona radiata. Multiple chronic microhemorrhages in the basal ganglia and pons. No hydrocephalus or extra-axial collection. No mass effect or midline shift. Vascular: Normal flow voids and vessel enhancement. Skull and upper cervical spine: Normal marrow signal and enhancement. Sinuses/Orbits: No acute or significant finding. Other: None. MRA HEAD FINDINGS Anterior circulation: Intracranial ICAs are patent without stenosis or aneurysm. The proximal ACAs and MCAs are patent without stenosis or aneurysm. Distal branches are symmetric. Posterior circulation: Visualized portions of the distal vertebral arteries and basilar artery are patent without stenosis or aneurysm. The SCAs, AICAs and PICAs are patent proximally. The PCAs are patent proximally without stenosis or aneurysm. Distal branches are symmetric. Anatomic variants: None. MRA NECK FINDINGS Aortic arch: Three-vessel arch configuration. Arch vessel origins are patent. Right carotid system: No evidence of dissection, stenosis (50% or greater), or occlusion. Tortuosity of the right cervical ICA. Left carotid system: No evidence of dissection, stenosis (50% or greater), or occlusion. Tortuosity of the left cervical ICA. Vertebral arteries: Left dominant. No evidence of dissection, stenosis (50% or greater), or occlusion. The right vertebral artery functionally terminates in PICA. Other: None. IMPRESSION: 1. Subacute infarct in the left frontal operculum with petechial hemorrhage and mild surrounding edema. No significant mass effect or midline shift. 2. No large vessel occlusion, hemodynamically significant stenosis, or aneurysm in  the head or neck. Electronically Signed   By: Orvan Falconer M.D.   On: 11/21/2022 18:19   CT Head Wo Contrast  Result Date: 11/21/2022 CLINICAL DATA:  Neuro deficit, acute, stroke suspected EXAM: CT HEAD WITHOUT CONTRAST TECHNIQUE: Contiguous axial images were obtained from the base of the skull through the vertex without intravenous contrast. RADIATION DOSE REDUCTION: This exam was performed according to the departmental dose-optimization program which includes automated exposure control, adjustment of the mA and/or kV according to patient size and/or use of iterative reconstruction technique. COMPARISON:  Brain MR 05/20/22 FINDINGS: Brain: Compared to prior exam there is new cytotoxic edema in the left frontal lobe with associated blood products this is favored to represent an acute infarct with associated  petechial hemorrhage. No evidence of a parenchymal hematoma. No hydrocephalus. No extra-axial fluid collection. Sequela of moderate chronic microvascular ischemic change with chronic infarcts in the right corona radiata and left thalamus. No mass effect. No midline shift. Vascular: No hyperdense vessel or unexpected calcification. Skull: Normal. Negative for fracture or focal lesion. Sinuses/Orbits: No middle ear or mastoid effusion. Paranasal sinuses are clear. Orbits are unremarkable. Other: None. IMPRESSION: Compared to prior exam there is new cytotoxic edema in the left frontal lobe with associated blood products. This is favored to represent an acute to subacute infarct with likely petechial hemorrhage. No definite evidence of a parenchymal hematoma Electronically Signed   By: Lorenza Cambridge M.D.   On: 11/21/2022 15:41      Assessment/Plan Principal Problem:   Stroke [facility]) Active Problems:   Paroxysmal atrial fibrillation (HCC)   GE junction carcinoma (HCC)   Hyperlipidemia   Chronic combined systolic and diastolic congestive heart failure (HCC)   Essential hypertension   Chronic kidney  disease, stage 3b (HCC)   Hypomagnesemia   Type II diabetes mellitus with renal manifestations (HCC)   Depression with anxiety   Obesity (BMI 30-39.9)   Assessment and Plan:   Stroke [facility]): pt has hx of stroke, now has left-sided numbness.  MRI of the brain with and without contrast showed subacute infarct in the left frontal operculum with petechial hemorrhage and mild surrounding edema. No significant mass effect or midline shift. MRA negative foir LVO. The MRI findings of left frontal operculum does not seem to explain his left-sided numbness.  Consulted Dr. Iver Nestle of neuro.  -Placed on tele med bed for observation -per Dr. Iver Nestle, pt is out of window for permissive hypertension - per Dr. Margretta Sidle, aspirin versus anticoagulation pending full neurology evaluation and discussion with oncology/GI  - Statin: lipitr - fasting lipid panel and HbA1c  - 2D transthoracic echocardiography  - swallowing screen. If fails, will get SLP - PT/OT consult  Paroxysmal atrial fibrillation (HCC): HR is 102. Pt is not taking anticoagulants currently -tele monitoring  GE junction carcinoma (HCC): Stage IV.  Chemotherapy, last dose was on Tuesday.  Patient is following up with Dr. Cathie Hoops -Message sent to Dr. Cathie Hoops for consult  Hyperlipidemia -Lipitor  Chronic combined systolic and diastolic congestive heart failure [facility]): 2D echo on 11/23/2021 showed EF 30-35% with grade 1 diastolic dysfunction.  Patient does not have shortness breath, but he has elevated BNP 239.9 and leg edema, patient is at risk of developing CHF exacerbation. -Continue torsemide  Essential hypertension -Torsemide -IV hydralazine as needed  Chronic kidney disease, stage 3b (HCC): Slightly worsening than baseline.  Recent baseline creatinine 1.0-1.4.  His creatinine is 1.51, BUN 41, GFR 55. -Monitor renal function closely  Hypomagnesemia: Magnesium 1.6 -Repleted magnesium  Type II diabetes mellitus with renal manifestations [facility]):  Recent A1c 5.9, well-controlled.  Patient is taking farxiga at home -Sliding scale insulin  Depression with anxiety -Continue home medications  Obesity (BMI 30-39.9): Body weight 111.9 kg, BMI 35.39 -Encourage losing weight -Exercise and healthy diet      DVT ppx: SCD  Code Status: Full code per pt  Family Communication: not done, no family member is at bed side.         Disposition Plan:  Anticipate discharge back to previous environment  Consults called:  Dr. Iver Nestle of neuro and Dr. Cathie Hoops of oncology  Admission status and Level of care: Telemetry Medical:   for obs    Dispo: The patient is from: Home  Anticipated d/c is to: Home              Anticipated d/c date is: 1 day              Patient currently is not medically stable to d/c.    Severity of Illness:  The appropriate patient status for this patient is OBSERVATION. Observation status is judged to be reasonable and necessary in order to provide the required intensity of service to ensure the patient's safety. The patient's presenting symptoms, physical exam findings, and initial radiographic and laboratory data in the context of their medical condition is felt to place them at decreased risk for further clinical deterioration. Furthermore, it is anticipated that the patient will be medically stable for discharge from the hospital within 2 midnights of admission.        Date of Service 11/21/2022    Lorretta Harp [facility]   If 7PM-7AM, please contact night-coverage [email or web address] 11/21/2022, 10:35 PM

## 2022-11-21 NOTE — Consult Note (Signed)
Neurology Consultation Reason for Consult: c/f stroke on MRI Requesting Physician: Lolita Patella  CC: Left-sided numbness/weakness  History is obtained from: Patient, chart review  HPI: Jeremiah Vance is a 52 y.o. 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  He reports that due to worsening pain on the left side he has had more more difficulty walking which is why he presented to the ED yesterday.  He feels better than he did yesterday, but is still limited in his ambulation due to his pain.  He denies any symptoms on the right side any new vision issues any new language issues or any other focal neurological problems at this time  Regarding his stroke history he had presentation in June 2019 with right-sided weakness but recovered well (left posterior limb of the internal capsule stroke, stuttering lacunar course).  In August 2023 he had a stroke presenting with confusion and left arm weakness found to have subcortical left frontal and posterior right frontal lobe strokes and multiple chronic microhemorrhages most consistent with chronic poorly controlled hypertension.  LKW: Unclear, patient reports no right-sided symptoms and gradual worsening of left leg over at least several weeks Thrombolytic given?: No, subacute stroke versus tumor on imaging IA performed?: No, exam not consistent with new LVO Premorbid modified rankin scale:      3 - Moderate disability. Requires some help, but able to walk unassisted  (uses a walker at least occasionally, sister manages his medications,  ROS: All other review of systems was negative except as noted in the HPI.   Past Medical History:  Diagnosis Date   Acute ischemic stroke (HCC) 09/06/2017   Acute renal failure superimposed on stage 3a chronic kidney disease (HCC) 07/08/2019   Acute right-sided weakness    Allergies    Anasarca    Anemia 07/10/2017   Arthritis    Cancer (HCC)    stomach and esophageal cancer stage 4   CHF (congestive heart failure) (HCC)    "coinsided w/kidney problems I was having 06/2017"   Chicken pox    CKD (chronic kidney disease) stage 3, GFR 30-59 ml/min (HCC) 06/2017   Depression    Elevated troponin 07/08/2019   High cholesterol    History of cardiomyopathy    LVEF 40 to 45% in April 2019 - subsequently normalized   Hyperbilirubinemia 07/10/2017   Hypertension    Ischemic stroke (HCC)    Small left internal capsule infarct due to lacunar disease   Morbid obesity (HCC)    Normocytic anemia 12/16/2017   Recurrent incisional hernia with incarceration s/p repair 10/22/2017 10/21/2017   Stroke (HCC) 08/2017   "right sided weakness since; getting stronger though" (10/22/2017)   Type 2 diabetes mellitus (HCC)    Past Surgical History:  Procedure Laterality Date   ABDOMINAL HERNIA REPAIR  2008; 10/22/2017   "scope; OPEN REPAIR INCARCERATED VENTRAL HERNIA   ESOPHAGOGASTRODUODENOSCOPY (EGD) WITH PROPOFOL N/A 09/10/2022   Procedure: ESOPHAGOGASTRODUODENOSCOPY (EGD) WITH PROPOFOL;  Surgeon: Toney Reil, MD;  Location: [facility] ENDOSCOPY;  Service: Gastroenterology;  Laterality: N/A;   ESOPHAGOGASTRODUODENOSCOPY (EGD) WITH PROPOFOL N/A 09/28/2022   Procedure: ESOPHAGOGASTRODUODENOSCOPY (EGD)  WITH PROPOFOL;  Surgeon: Jaynie Collins, DO;  Location: [facility] ENDOSCOPY;  Service: Gastroenterology;  Laterality: N/A;   HEMOSTASIS CONTROL  09/28/2022   Procedure: HEMOSTASIS CONTROL;  Surgeon: Jaynie Collins, DO;  Location:  [facility] ENDOSCOPY;  Service: Gastroenterology;;   HERNIA REPAIR     KNEE ARTHROSCOPY Right 1989   PORTA CATH INSERTION N/A 09/25/2022   Procedure: PORTA CATH INSERTION;  Surgeon: Annice Needy, MD;  Location: [facility] INVASIVE CV LAB;  Service: Cardiovascular;  Laterality: N/A;   VENTRAL HERNIA REPAIR N/A 10/22/2017   Procedure: OPEN REPAIR INCARCERATED VENTRAL HERNIA;  Surgeon: Glenna Fellows, MD;  Location: [facility] OR;  Service: General;  Laterality: N/A;   Current Outpatient Medications  Medication Instructions   atorvastatin (LIPITOR) 40 mg, Oral, Daily-1800   busPIRone (BUSPAR) 7.5 mg, Oral, 2 times daily   Continuous Glucose Sensor (FREESTYLE LIBRE 2 SENSOR) MISC Place 1 sensor on the skin every 14 days. Use to check glucose continuously   dapagliflozin propanediol (FARXIGA) 10 mg, Oral, Daily, Take 1 tablet (10 mg) by mouth once daily/please contact office to schedule follow up prior to further refills.   dexamethasone (DECADRON) 8 mg, Oral, Daily, Start the day after chemotherapy for 2 days. Take with food.   gabapentin (NEURONTIN) 100 mg, Oral, 2 times daily   ondansetron (ZOFRAN-ODT) 8 mg, Oral, Every 8 hours PRN   oxyCODONE (OXY IR/ROXICODONE) 5 mg, Oral, Every 4 hours PRN   pantoprazole (PROTONIX) 40 mg, Oral, 2 times daily before meals   prochlorperazine (COMPAZINE) 10 mg, Oral, Every 6 hours PRN   scopolamine (TRANSDERM-SCOP) 1.5 mg, Transdermal, every 72 hours   sertraline (ZOLOFT) 50 mg, Oral, Daily   torsemide (DEMADEX) 40 mg, Oral, Daily PRN     Family History  Problem Relation Age of Onset   Diabetes Mother    Stroke Mother    Arthritis Mother    Depression Mother    Heart disease Mother    Hypertension Mother    Learning disabilities Mother    Mental illness Mother    Sleep apnea Mother    Diabetes Father    Heart disease Father    Arthritis Father    Hearing loss Father    Hyperlipidemia Father    Heart attack Father    Hypertension Father    Stroke Father     Diabetes Sister    Depression Sister    Diabetes Sister    Hypertension Sister    Mental illness Sister    Sleep apnea Sister    Diabetes Maternal Grandmother    Heart disease Maternal Grandmother    Depression Maternal Grandmother    Hyperlipidemia Maternal Grandmother    Hypertension Maternal Grandmother    Stroke Maternal Grandmother    Hypertension Maternal Grandfather    Hyperlipidemia Maternal Grandfather    Stroke Maternal Grandfather    Arthritis Paternal Grandmother    Hearing loss Paternal Grandmother    Stroke Paternal Grandfather    Heart disease Paternal Grandfather    Arthritis Paternal Grandfather    Heart attack Paternal Grandfather    Melanoma Other     Social History:  reports that he has never smoked. He has never used smokeless tobacco. He reports current alcohol use. He reports that he does not use drugs.   Exam: Current vital signs: BP (!) 171/82   Pulse 83   Temp 98.2 F (36.8 C)   Resp 18   SpO2 100%  Vital signs in last 24 hours: Temp:  [  98.2 F (36.8 C)] 98.2 F (36.8 C) (08/22 1443) Pulse Rate:  [75-92] 83 (08/22 2018) Resp:  [18] 18 (08/22 1756) BP: (133-184)/(82-90) 171/82 (08/22 2018) SpO2:  [95 %-100 %] 100 % (08/22 2018)   Physical Exam  Constitutional: Appears mildly chronically ill Psych: Affect pleasant and cooperative Eyes: No scleral injection HENT: No oropharyngeal obstruction.  MSK: no major joint deformities.  Cardiovascular: Normal rate and regular rhythm. Perfusing extremities well Respiratory: Effort normal, non-labored breathing GI: Soft.  No distension.  Skin: Chronic lower extremity skin changes bilaterally symmetric  Neuro: Mental Status: Patient is awake, alert, oriented to person, place, month, year, and situation.  However he is somewhat inconsistent in his details.  For example, asking about right leg weakness he initially reports gradually progressive right leg weakness but then later states that his  symptoms only been on the left side Mild aphasia at baseline per patient/family (unable to name lenses/frame, slight hesitation when repeating) Cranial Nerves: II: Unable to count fingers for me today in the right eye.  However he is able to perform finger-to-nose with some inaccuracy due to his poor vision.  Left pupil is 4 mm and fixed.  Right pupil is 4 mm to 2 mm III,IV, VI: Eyes and somewhat downgaze.  Seems to have somewhat restricted upgaze but normal gaze bilaterally V: Facial sensation is symmetric to light touch VII: Facial movement is symmetric.  VIII: hearing is intact to voice X: Uvula elevates symmetrically XI: Shoulder shrug is symmetric. XII: tongue is midline without atrophy or fasciculations.  Sensory/motor: Exam limited by allodynia on the left side, as well as neuropathic pain in bilateral legs.  Within these limits strength in the right upper extremity appears 5/5, left upper extremity 4/5 in an upper motor neuron pattern, bilateral lower extremities at least 4/5 due to pain and a little stronger in the right than the left Deep Tendon Reflexes: Unable to elicit patellar's bilaterally and patient flinching in pain.  Brachioradialis is increased on the left compared to the right Cerebellar: Finger-nose intact bilaterally Gait:  Patient unable to ambulate well due to significant neuropathic pain when he attempts to stand  NIHSS total 6 Score breakdown: 1 point for restricted upgaze, 1 point for left arm drift (though this is secondary to pain), 1 point for left leg drift, 1 point for right leg drift, 1 point for left-sided sensory loss, 1 point for mild aphasia, noting difficulty testing visual fields due to his baseline visual deficits  I have reviewed labs in epic and the results pertinent to this consultation are:  Basic Metabolic Panel: Recent Labs  Lab 11/21/22 1450  NA 135  K 3.6  CL 103  CO2 27  GLUCOSE 157*  BUN 41*  CREATININE 1.51*  CALCIUM 8.3*  MG  1.6*  PHOS 2.9    CBC: Recent Labs  Lab 11/21/22 1450  WBC 6.4  NEUTROABS 4.5  HGB 10.4*  HCT 31.9*  MCV 82.9  PLT 248    Coagulation Studies: No results for input(s): "LABPROT", "INR" in the last 72 hours.    I have reviewed the images obtained:  Head CT reviewed, agree with radiology Compared to prior exam there is new cytotoxic edema in the left frontal lobe with associated blood products. This is favored to represent an acute to subacute infarct with likely petechial hemorrhage. No definite evidence of a parenchymal hematoma  MRI brain radiology report 1. Subacute infarct in the left frontal operculum with petechial hemorrhage and mild surrounding  edema. No significant mass effect or midline shift. 2. No large vessel occlusion, hemodynamically significant stenosis, or aneurysm in the head or neck. On my review some elements of the edema look more vasogenic and therefore I am concerned about the possibility of metastatic disease versus subacute infarct, though I agree there is no significant enhancement.   Impression: 52 year old gentleman with past medical history significant for atrial fibrillation off of anticoagulation due to his gastric cancer.  Unfortunately given his baseline deficits even after history and physical examination I am uncertain of the etiology of his new left frontal lobe lesion.  He certainly does not describe a sudden change that would fit with a stroke, nor does he describe any new symptoms on the right side, nor my able to detect any significant weakness on the right side and he reports his aphasia is at baseline.  From a neurological perspective if this is a stroke the lesion is subacute and would therefore not be an contraindication to anticoagulation at this time.  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  Please increase gradually as below   Morning  Night  Week 1 and 2   none   25 mg   Week 3 and 4   25 mg    25 mg  Week 5   25 mg   50 mg  Week 6   50 mg   75 mg  Week 7   75 mg 100 mg  Week 8  100 mg 125 mg  -Head PET/CT (stroke would be expected to be hypometabolic while malignancy would be expected to be hypermetabolic) to clarify whether the lesion is malignant or stroke; alternatively may repeat MRI brain in 3 to 4 weeks with and without contrast at discretion of patient's outpatient providers -Antiplatelets/anticoagulation when cleared by oncology.  Ideally would be on anticoagulation for stroke prevention, if not would use aspirin 325 mg if possible, however if neither of these can be used due to his GI bleeding we will just need to accept the increased risk of stroke in the setting of A-fib as well as potential hypercoagulable state due to malignancy -Inpatient neurology will sign off at this time but will be available as needed if additional questions or concerns arise   Brooke Dare MD-PhD [facility] [phone] [facility] coverage for [facility] is from 8 AM to 4 AM in-house and 4 PM to 8 PM by telephone/video. 8 PM to 8 AM emergent questions or overnight urgent questions should be addressed to Teleneurology On-call or [facility] neurohospitalist; contact information can be found on AMION   Discussed with primary team and oncology via secure chat

## 2022-11-22 ENCOUNTER — Other Ambulatory Visit: Payer: Self-pay | Admitting: Oncology

## 2022-11-22 ENCOUNTER — Telehealth: Payer: Self-pay | Admitting: Oncology

## 2022-11-22 ENCOUNTER — Encounter: Payer: Self-pay | Admitting: Internal Medicine

## 2022-11-22 ENCOUNTER — Encounter: Payer: Self-pay | Admitting: Oncology

## 2022-11-22 ENCOUNTER — Observation Stay: Payer: Medicaid Other

## 2022-11-22 ENCOUNTER — Observation Stay (HOSPITAL_COMMUNITY)
Admit: 2022-11-22 | Discharge: 2022-11-22 | Disposition: A | Discharge status: Home / Self Care | Payer: Medicaid Other | Attending: Internal Medicine | Admitting: Internal Medicine

## 2022-11-22 DIAGNOSIS — Z7901 Long term (current) use of anticoagulants: Secondary | ICD-10-CM | POA: Diagnosis not present

## 2022-11-22 DIAGNOSIS — G9389 Other specified disorders of brain: Secondary | ICD-10-CM | POA: Diagnosis not present

## 2022-11-22 DIAGNOSIS — I639 Cerebral infarction, unspecified: Secondary | ICD-10-CM | POA: Diagnosis not present

## 2022-11-22 DIAGNOSIS — F32A Depression, unspecified: Secondary | ICD-10-CM | POA: Diagnosis not present

## 2022-11-22 DIAGNOSIS — G936 Cerebral edema: Secondary | ICD-10-CM | POA: Diagnosis not present

## 2022-11-22 DIAGNOSIS — D500 Iron deficiency anemia secondary to blood loss (chronic): Secondary | ICD-10-CM | POA: Diagnosis not present

## 2022-11-22 DIAGNOSIS — I6389 Other cerebral infarction: Secondary | ICD-10-CM | POA: Diagnosis not present

## 2022-11-22 DIAGNOSIS — M79605 Pain in left leg: Secondary | ICD-10-CM | POA: Diagnosis not present

## 2022-11-22 DIAGNOSIS — R29706 NIHSS score 6: Secondary | ICD-10-CM | POA: Diagnosis not present

## 2022-11-22 DIAGNOSIS — I6782 Cerebral ischemia: Secondary | ICD-10-CM | POA: Diagnosis not present

## 2022-11-22 DIAGNOSIS — C160 Malignant neoplasm of cardia: Secondary | ICD-10-CM | POA: Diagnosis not present

## 2022-11-22 DIAGNOSIS — F419 Anxiety disorder, unspecified: Secondary | ICD-10-CM | POA: Diagnosis present

## 2022-11-22 DIAGNOSIS — I771 Stricture of artery: Secondary | ICD-10-CM | POA: Diagnosis not present

## 2022-11-22 DIAGNOSIS — I130 Hypertensive heart and chronic kidney disease with heart failure and stage 1 through stage 4 chronic kidney disease, or unspecified chronic kidney disease: Secondary | ICD-10-CM | POA: Diagnosis not present

## 2022-11-22 DIAGNOSIS — G4733 Obstructive sleep apnea (adult) (pediatric): Secondary | ICD-10-CM | POA: Diagnosis not present

## 2022-11-22 DIAGNOSIS — M7989 Other specified soft tissue disorders: Secondary | ICD-10-CM | POA: Diagnosis not present

## 2022-11-22 DIAGNOSIS — R4701 Aphasia: Secondary | ICD-10-CM | POA: Diagnosis not present

## 2022-11-22 DIAGNOSIS — K7460 Unspecified cirrhosis of liver: Secondary | ICD-10-CM | POA: Diagnosis not present

## 2022-11-22 DIAGNOSIS — I480 Paroxysmal atrial fibrillation: Secondary | ICD-10-CM | POA: Diagnosis not present

## 2022-11-22 DIAGNOSIS — I6381 Other cerebral infarction due to occlusion or stenosis of small artery: Secondary | ICD-10-CM | POA: Diagnosis not present

## 2022-11-22 DIAGNOSIS — E669 Obesity, unspecified: Secondary | ICD-10-CM | POA: Diagnosis not present

## 2022-11-22 DIAGNOSIS — G939 Disorder of brain, unspecified: Secondary | ICD-10-CM

## 2022-11-22 DIAGNOSIS — I5042 Chronic combined systolic (congestive) and diastolic (congestive) heart failure: Secondary | ICD-10-CM | POA: Diagnosis not present

## 2022-11-22 DIAGNOSIS — I69354 Hemiplegia and hemiparesis following cerebral infarction affecting left non-dominant side: Secondary | ICD-10-CM | POA: Diagnosis not present

## 2022-11-22 DIAGNOSIS — E7800 Pure hypercholesterolemia, unspecified: Secondary | ICD-10-CM | POA: Diagnosis present

## 2022-11-22 DIAGNOSIS — R29818 Other symptoms and signs involving the nervous system: Secondary | ICD-10-CM | POA: Diagnosis not present

## 2022-11-22 DIAGNOSIS — E1122 Type 2 diabetes mellitus with diabetic chronic kidney disease: Secondary | ICD-10-CM | POA: Diagnosis not present

## 2022-11-22 DIAGNOSIS — N1832 Chronic kidney disease, stage 3b: Secondary | ICD-10-CM | POA: Diagnosis not present

## 2022-11-22 DIAGNOSIS — G890 Central pain syndrome: Secondary | ICD-10-CM | POA: Diagnosis not present

## 2022-11-22 DIAGNOSIS — H548 Legal blindness, as defined in USA: Secondary | ICD-10-CM | POA: Diagnosis present

## 2022-11-22 DIAGNOSIS — M109 Gout, unspecified: Secondary | ICD-10-CM | POA: Diagnosis present

## 2022-11-22 DIAGNOSIS — I69398 Other sequelae of cerebral infarction: Secondary | ICD-10-CM | POA: Diagnosis not present

## 2022-11-22 LAB — ECHOCARDIOGRAM COMPLETE
AR max vel: 2.79 cm2
AV Area VTI: 3.12 cm2
AV Area mean vel: 2.93 cm2
AV Mean grad: 2 mmHg
AV Peak grad: 3.2 mmHg
Ao pk vel: 0.89 m/s
Area-P 1/2: 5.58 cm2
Height: 70 in
S' Lateral: 3.3 cm
Weight: 3915.37 [oz_av]

## 2022-11-22 LAB — GLUCOSE, CAPILLARY
Glucose-Capillary: 126 mg/dL — ABNORMAL HIGH (ref 70–99)
Glucose-Capillary: 158 mg/dL — ABNORMAL HIGH (ref 70–99)
Glucose-Capillary: 113 mg/dL — ABNORMAL HIGH (ref 70–99)
Glucose-Capillary: 164 mg/dL — ABNORMAL HIGH (ref 70–99)

## 2022-11-22 LAB — LIPID PANEL
Cholesterol: 120 mg/dL (ref 0–200)
HDL: 45 mg/dL (ref >40)
LDL Cholesterol: 63 mg/dL (ref 0–99)
Total CHOL/HDL Ratio: 2.7 ratio
Triglycerides: 58 mg/dL (ref <150)
VLDL: 12 mg/dL (ref 0–40)

## 2022-11-22 LAB — HEMOGLOBIN A1C
Hgb A1c MFr Bld: 7 % — ABNORMAL HIGH (ref 4.8–5.6)
Mean Plasma Glucose: 154.2 mg/dL

## 2022-11-22 MED ORDER — APIXABAN 2.5 MG PO TABS
2.5000 mg | ORAL_TABLET | Freq: Two times a day (BID) | ORAL | Status: DC
  Administered 2022-11-22 – 2022-11-27 (×10): 2.5 mg via ORAL
  Filled 2022-11-22 (×10): qty 1

## 2022-11-22 MED ORDER — BUSPIRONE HCL 15 MG PO TABS
7.5000 mg | ORAL_TABLET | Freq: Two times a day (BID) | ORAL | Status: DC
  Administered 2022-11-22 – 2022-11-27 (×11): 7.5 mg via ORAL
  Filled 2022-11-22 (×2): qty 1.5
  Filled 2022-11-22 (×2): qty 1
  Filled 2022-11-22: qty 1.5
  Filled 2022-11-22 (×4): qty 1
  Filled 2022-11-22 (×2): qty 1.5

## 2022-11-22 MED ORDER — ATORVASTATIN CALCIUM 20 MG PO TABS
40.0000 mg | ORAL_TABLET | Freq: Every day | ORAL | Status: DC
  Administered 2022-11-22 – 2022-11-26 (×5): 40 mg via ORAL
  Filled 2022-11-22 (×5): qty 2

## 2022-11-22 MED ORDER — GABAPENTIN 100 MG PO CAPS
100.0000 mg | ORAL_CAPSULE | Freq: Two times a day (BID) | ORAL | Status: DC
  Administered 2022-11-22 – 2022-11-27 (×11): 100 mg via ORAL
  Filled 2022-11-22 (×11): qty 1

## 2022-11-22 MED ORDER — SERTRALINE HCL 50 MG PO TABS
50.0000 mg | ORAL_TABLET | Freq: Every day | ORAL | Status: DC
  Administered 2022-11-22 – 2022-11-27 (×6): 50 mg via ORAL
  Filled 2022-11-22 (×6): qty 1

## 2022-11-22 MED ORDER — PANTOPRAZOLE SODIUM 40 MG PO TBEC
40.0000 mg | DELAYED_RELEASE_TABLET | Freq: Two times a day (BID) | ORAL | Status: DC
  Administered 2022-11-22 – 2022-11-27 (×11): 40 mg via ORAL
  Filled 2022-11-22 (×11): qty 1

## 2022-11-22 MED ORDER — OXYCODONE HCL 5 MG PO TABS
5.0000 mg | ORAL_TABLET | ORAL | Status: DC | PRN
  Filled 2022-11-22: qty 1

## 2022-11-22 MED ORDER — TORSEMIDE 20 MG PO TABS: 40.0000 mg | ORAL_TABLET | Freq: Every day | ORAL | Status: DC | PRN

## 2022-11-22 MED ORDER — LAMOTRIGINE 25 MG PO TABS
25.0000 mg | ORAL_TABLET | Freq: Every day | ORAL | Status: DC
  Administered 2022-11-22 – 2022-11-26 (×5): 25 mg via ORAL
  Filled 2022-11-22 (×5): qty 1

## 2022-11-22 MED ORDER — OXYCODONE HCL 5 MG PO TABS
5.0000 mg | ORAL_TABLET | ORAL | Status: DC | PRN
  Administered 2022-11-22: 10 mg via ORAL
  Administered 2022-11-22: 5 mg via ORAL
  Administered 2022-11-23 – 2022-11-27 (×4): 10 mg via ORAL
  Filled 2022-11-22 (×5): qty 2

## 2022-11-22 NOTE — Progress Notes (Signed)
? ?  Inpatient Rehab Admissions Coordinator : ? ?Per therapy recommendations, patient was screened for CIR candidacy by Barbara Boyette RN MSN.  At this time patient appears to be a potential candidate for CIR. I will place a rehab consult per protocol for full assessment. Please call me with any questions. ? ?Barbara Boyette RN MSN ?Admissions Coordinator ?[phone] ?  ?

## 2022-11-22 NOTE — Telephone Encounter (Signed)
Appts were r/s by the provider due to pt being in the hospital.   I left a vm for pt sister to let her know the appts were adjusted and are available to see in mychart

## 2022-11-22 NOTE — Progress Notes (Signed)
PROGRESS NOTE    Jeremiah Vance  MVH:[id number] DOB: Mar 15, 1971 DOA: 11/21/2022 PCP: Debera Lat, PA-C    Brief Narrative:  52 y.o. male with medical history significant of GE junction carcinoma, HTN, HLD, DM, sCHF with EF 30-35%, stroke with mild left sided weakness, depression with anxiety, A-fib not on anticoagulants, obesity, who presents with left-sided numbness.   Patient states that he has history of stroke with mild left-sided weakness.  At about 9:30, he started feeling left-sided numbness.  His weakness in right arm and leg has not changed.  No vision change or hearing loss.  Patient denies chest pain, cough, SOB.  No fever or chills.  Denies symptoms of UTI.  No nausea, vomiting, diarrhea or abdominal pain.  Denies rectal bleeding or dark stool.   Assessment & Plan:   Principal Problem:   Stroke [facility]) Active Problems:   Paroxysmal atrial fibrillation (HCC)   GE junction carcinoma (HCC)   Hyperlipidemia   Chronic combined systolic and diastolic congestive heart failure (HCC)   Essential hypertension   Chronic kidney disease, stage 3b (HCC)   Hypomagnesemia   Type II diabetes mellitus with renal manifestations (HCC)   Depression with anxiety   Obesity (BMI 30-39.9)   Acute CVA (cerebrovascular accident) (HCC)  Stroke [facility]): pt has hx of stroke, now has left-sided numbness.  MRI of the brain with and without contrast showed subacute infarct in the left frontal operculum with petechial hemorrhage and mild surrounding edema. No significant mass effect or midline shift. MRA negative foir LVO. The MRI findings of left frontal operculum does not seem to explain his left-sided numbness.  Consulted Dr. Iver Nestle of neuro. Plan: Will need further follow-up from oncology and neurology.  In the meantime okay for PT OT.  Okay for diet.  Continue telemetry monitoring.  Check 2D echocardiogram.  Fasting lipid panel and A1c.   Paroxysmal atrial fibrillation (HCC)  HR is 102. Pt is not  taking anticoagulants currently -tele monitoring   GE junction carcinoma (HCC)  Stage IV.  Chemotherapy, last dose was on Tuesday.   Patient is following up with Dr. Cathie Hoops -Oncology consult pending   Hyperlipidemia -Lipitor   Chronic combined systolic and diastolic congestive heart failure (HCC)  2D echo on 11/23/2021 showed EF 30-35% with grade 1 diastolic dysfunction.  Patient does not have shortness breath, but he has elevated BNP 239.9 and leg edema, patient is at risk of developing CHF exacerbation. -Continue torsemide   Essential hypertension -Torsemide -IV hydralazine as needed   Chronic kidney disease, stage 3b (HCC): Slightly worsening than baseline.  Recent baseline creatinine 1.0-1.4.  His creatinine is 1.51, BUN 41, GFR 55. -Monitor renal function closely   Hypomagnesemia:  -Repleted magnesium   Type II diabetes mellitus with renal manifestations [facility]): Recent A1c 5.9, well-controlled.  Patient is taking farxiga at home -Sliding scale insulin   Depression with anxiety -Continue home medications   Obesity (BMI 30-39.9): Body weight 111.9 kg, BMI 35.39 -Encourage losing weight -Exercise and healthy diet  DVT prophylaxis: SCD Code Status: Full Family Communication: None Disposition Plan: Status is: Inpatient Remains inpatient appropriate because: Subacute CVA   Level of care: Telemetry Medical  Consultants:  Neurology Oncology  Procedures:  None  Antimicrobials: None   Subjective: Seen and examined.  Resting comfortably in bed.  No visible distress.  Dors is a little numbness of left upper and left lower extremities  Objective: Vitals:   11/22/22 0100 11/22/22 0516 11/22/22 0905 11/22/22 1230  BP: [name] Kitchen)  164/90 (!) 150/84 (!) 154/84 (!) 145/97  Pulse: (!) 109 89 83 91  Resp: 18 18 18 18   Temp: 98.6 F (37 C) 98.3 F (36.8 C) 98.3 F (36.8 C) 97.7 F (36.5 C)  TempSrc: Oral     SpO2: 98% 94% 96% 97%  Weight: 111 kg     Height: 5\' 10"  (1.778 m)        Intake/Output Summary (Last 24 hours) at 11/22/2022 1348 Last data filed at 11/22/2022 1300 Gross per 24 hour  Intake --  Output 150 ml  Net -150 ml   Filed Weights   11/22/22 0100  Weight: 111 kg    Examination:  General exam: Appears calm and comfortable  Respiratory system: Clear to auscultation. Respiratory effort normal. Cardiovascular system: S1-S2, RRR, no murmurs, no pedal edema Gastrointestinal system: Soft NT/ND, normal bowel sounds Central nervous system: Alert and oriented. No focal neurological deficits. Extremities: Decreased power LUE, LLE.  Decreased grip strength Skin: Chronic appearing erythema on bilateral shins Psychiatry: Judgement and insight appear normal. Mood & affect appropriate.     Data Reviewed: I have personally reviewed following labs and imaging studies  CBC: Recent Labs  Lab 11/21/22 1450  WBC 6.4  NEUTROABS 4.5  HGB 10.4*  HCT 31.9*  MCV 82.9  PLT 248   Basic Metabolic Panel: Recent Labs  Lab 11/21/22 1450  NA 135  K 3.6  CL 103  CO2 27  GLUCOSE 157*  BUN 41*  CREATININE 1.51*  CALCIUM 8.3*  MG 1.6*  PHOS 2.9   GFR: Estimated Creatinine Clearance: 71.4 mL/min (A) (by C-G formula based on SCr of 1.51 mg/dL (H)). Liver Function Tests: Recent Labs  Lab 11/21/22 1450  AST 16  ALT 16  ALKPHOS 88  BILITOT 0.9  PROT 7.2  ALBUMIN 3.3*   No results for input(s): "LIPASE", "AMYLASE" in the last 168 hours. No results for input(s): "AMMONIA" in the last 168 hours. Coagulation Profile: No results for input(s): "INR", "PROTIME" in the last 168 hours. Cardiac Enzymes: No results for input(s): "CKTOTAL", "CKMB", "CKMBINDEX", "TROPONINI" in the last 168 hours. BNP (last 3 results) Recent Labs    11/28/21 1520  PROBNP 966*   HbA1C: No results for input(s): "HGBA1C" in the last 72 hours. CBG: Recent Labs  Lab 11/21/22 2118 11/22/22 0906 11/22/22 1231  GLUCAP 126* 126* 158*   Lipid Profile: Recent Labs     11/22/22 0844  CHOL 120  HDL 45  LDLCALC 63  TRIG 58  CHOLHDL 2.7   Thyroid Function Tests: No results for input(s): "TSH", "T4TOTAL", "FREET4", "T3FREE", "THYROIDAB" in the last 72 hours. Anemia Panel: No results for input(s): "VITAMINB12", "FOLATE", "FERRITIN", "TIBC", "IRON", "RETICCTPCT" in the last 72 hours. Sepsis Labs: No results for input(s): "PROCALCITON", "LATICACIDVEN" in the last 168 hours.  No results found for this or any previous visit (from the past 240 hour(s)).       Radiology Studies: ECHOCARDIOGRAM COMPLETE  Result Date: 11/22/2022    ECHOCARDIOGRAM REPORT   Patient Name:   Jeremiah Vance Date of Exam: 11/22/2022 Medical Rec #:  [id number]       Height:       70.0 in Accession #:    [id number]      Weight:       244.7 lb Date of Birth:  April 29, 1970       BSA:          2.274 m Patient Age:    12 years  BP:           154/84 mmHg Patient Gender: M               HR:           83 bpm. Exam Location:  [facility] Procedure: 2D Echo, Cardiac Doppler and Color Doppler Indications:     Stroke I63.9  History:         Patient has prior history of Echocardiogram examinations, most                  recent 11/23/2021. CHF, Stroke; Risk Factors:Hypertension.  Sonographer:     Cristela Blue Referring Phys:  [id number] Brien Few NIU Diagnosing Phys: Debbe Odea MD  Sonographer Comments: Suboptimal apical window. IMPRESSIONS  1. Left ventricular ejection fraction, by estimation, is 55 to 60%. The left ventricle has normal function. The left ventricle has no regional wall motion abnormalities. There is mild left ventricular hypertrophy. Left ventricular diastolic parameters are indeterminate.  2. Right ventricular systolic function is low normal. The right ventricular size is mildly enlarged. There is normal pulmonary artery systolic pressure.  3. Left atrial size was mildly dilated.  4. The mitral valve is normal in structure. Mild mitral valve regurgitation.  5. The aortic valve is tricuspid. Aortic  valve regurgitation is not visualized.  6. Aortic dilatation noted. There is borderline dilatation of the aortic root, measuring 38 mm.  7. The inferior vena cava is dilated in size with <50% respiratory variability, suggesting right atrial pressure of 15 mmHg. FINDINGS  Left Ventricle: Left ventricular ejection fraction, by estimation, is 55 to 60%. The left ventricle has normal function. The left ventricle has no regional wall motion abnormalities. The left ventricular internal cavity size was normal in size. There is  mild left ventricular hypertrophy. Left ventricular diastolic parameters are indeterminate. Right Ventricle: The right ventricular size is mildly enlarged. No increase in right ventricular wall thickness. Right ventricular systolic function is low normal. There is normal pulmonary artery systolic pressure. The tricuspid regurgitant velocity is 1.75 m/s, and with an assumed right atrial pressure of 15 mmHg, the estimated right ventricular systolic pressure is 27.2 mmHg. Left Atrium: Left atrial size was mildly dilated. Right Atrium: Right atrial size was normal in size. Pericardium: There is no evidence of pericardial effusion. Mitral Valve: The mitral valve is normal in structure. Mild mitral valve regurgitation. Tricuspid Valve: The tricuspid valve is normal in structure. Tricuspid valve regurgitation is not demonstrated. Aortic Valve: The aortic valve is tricuspid. Aortic valve regurgitation is not visualized. Aortic valve mean gradient measures 2.0 mmHg. Aortic valve peak gradient measures 3.2 mmHg. Aortic valve area, by VTI measures 3.12 cm. Pulmonic Valve: The pulmonic valve was normal in structure. Pulmonic valve regurgitation is not visualized. Aorta: Aortic dilatation noted. There is borderline dilatation of the aortic root, measuring 38 mm. Venous: The inferior vena cava is dilated in size with less than 50% respiratory variability, suggesting right atrial pressure of 15 mmHg. IAS/Shunts:  No atrial level shunt detected by color flow Doppler.  LEFT VENTRICLE PLAX 2D LVIDd:         4.80 cm LVIDs:         3.30 cm LV PW:         2.00 cm LV IVS:        1.60 cm LVOT diam:     2.30 cm LV SV:         40 LV SV Index:   18 LVOT Area:  4.15 cm  RIGHT VENTRICLE RV Basal diam:  4.80 cm RV Mid diam:    3.80 cm RV S prime:     12.40 cm/s TAPSE (M-mode): 2.6 cm LEFT ATRIUM           Index        RIGHT ATRIUM           Index LA diam:      4.90 cm 2.15 cm/m   RA Area:     19.80 cm LA Vol (A2C): 61.8 ml 27.17 ml/m  RA Volume:   58.60 ml  25.77 ml/m LA Vol (A4C): 78.6 ml 34.56 ml/m  AORTIC VALVE AV Area (Vmax):    2.79 cm AV Area (Vmean):   2.93 cm AV Area (VTI):     3.12 cm AV Vmax:           89.05 cm/s AV Vmean:          58.200 cm/s AV VTI:            0.130 m AV Peak Grad:      3.2 mmHg AV Mean Grad:      2.0 mmHg LVOT Vmax:         59.90 cm/s LVOT Vmean:        41.000 cm/s LVOT VTI:          0.097 m LVOT/AV VTI ratio: 0.75  AORTA Ao Root diam: 3.80 cm MITRAL VALVE               TRICUSPID VALVE MV Area (PHT): 5.58 cm    TR Peak grad:   12.2 mmHg MV Decel Time: 136 msec    TR Vmax:        175.00 cm/s MV E velocity: 92.20 cm/s                            SHUNTS                            Systemic VTI:  0.10 m                            Systemic Diam: 2.30 cm Debbe Odea MD Electronically signed by Debbe Odea MD Signature Date/Time: 11/22/2022/12:49:58 PM    Final    US Venous Img Lower Unilateral Left (DVT)  Result Date: 11/22/2022 CLINICAL DATA:  Left lower extremity pain and swelling EXAM: LEFT LOWER EXTREMITY VENOUS DOPPLER ULTRASOUND TECHNIQUE: Gray-scale sonography with compression, as well as color and duplex ultrasound, were performed to evaluate the deep venous system(s) from the level of the common femoral vein through the popliteal and proximal calf veins. COMPARISON:  None Available. FINDINGS: VENOUS Normal compressibility of the common femoral, superficial femoral, and popliteal  veins, as well as the visualized calf veins. Visualized portions of profunda femoral vein and great saphenous vein unremarkable. No filling defects to suggest DVT on grayscale or color Doppler imaging. Doppler waveforms show normal direction of venous flow, normal respiratory plasticity and response to augmentation. Limited views of the contralateral common femoral vein are unremarkable. OTHER None. Limitations: none IMPRESSION: Negative. Electronically Signed   By: Malachy Moan M.D.   On: 11/22/2022 11:00   MR Brain W and Wo Contrast  Result Date: 11/21/2022 CLINICAL DATA:  Neuro deficit, acute, stroke suspected. Whole-body numbness, worse on left side. History of stroke with left-sided  deficits. EXAM: MRI HEAD WITHOUT AND WITH CONTRAST MRA HEAD WITHOUT CONTRAST MRA NECK WITHOUT AND WITH CONTRAST TECHNIQUE: Multiplanar, multi-echo pulse sequences of the brain and surrounding structures were acquired without and with intravenous contrast. Angiographic images of the Circle of Willis were acquired using MRA technique without intravenous contrast. Angiographic images of the neck were acquired using MRA technique without and with intravenous contrast. Carotid stenosis measurements (when applicable) are obtained utilizing NASCET criteria, using the distal internal carotid diameter as the denominator. 3D post processing, including maximum intensity projections and multiplanar reformations, was performed for better evaluation of the vasculature. CONTRAST:  10mL GADAVIST GADOBUTROL 1 MMOL/ML IV SOLN COMPARISON:  MRI brain 05/20/2022.  Head CT 11/21/2022. FINDINGS: MRI HEAD FINDINGS Brain: Subacute infarct in the left frontal operculum with petechial hemorrhage and mild surrounding edema. No significant mass effect or midline shift. Unchanged background of moderate chronic small-vessel disease with numerous perforator infarcts in the bilateral basal ganglia, thalami and cerebral white matter. Sequela of prior  hemorrhage in the right external capsule/corona radiata. Multiple chronic microhemorrhages in the basal ganglia and pons. No hydrocephalus or extra-axial collection. No mass effect or midline shift. Vascular: Normal flow voids and vessel enhancement. Skull and upper cervical spine: Normal marrow signal and enhancement. Sinuses/Orbits: No acute or significant finding. Other: None. MRA HEAD FINDINGS Anterior circulation: Intracranial ICAs are patent without stenosis or aneurysm. The proximal ACAs and MCAs are patent without stenosis or aneurysm. Distal branches are symmetric. Posterior circulation: Visualized portions of the distal vertebral arteries and basilar artery are patent without stenosis or aneurysm. The SCAs, AICAs and PICAs are patent proximally. The PCAs are patent proximally without stenosis or aneurysm. Distal branches are symmetric. Anatomic variants: None. MRA NECK FINDINGS Aortic arch: Three-vessel arch configuration. Arch vessel origins are patent. Right carotid system: No evidence of dissection, stenosis (50% or greater), or occlusion. Tortuosity of the right cervical ICA. Left carotid system: No evidence of dissection, stenosis (50% or greater), or occlusion. Tortuosity of the left cervical ICA. Vertebral arteries: Left dominant. No evidence of dissection, stenosis (50% or greater), or occlusion. The right vertebral artery functionally terminates in PICA. Other: None. IMPRESSION: 1. Subacute infarct in the left frontal operculum with petechial hemorrhage and mild surrounding edema. No significant mass effect or midline shift. 2. No large vessel occlusion, hemodynamically significant stenosis, or aneurysm in the head or neck. Electronically Signed   By: Orvan Falconer M.D.   On: 11/21/2022 18:19   MR ANGIO HEAD WO CONTRAST  Result Date: 11/21/2022 CLINICAL DATA:  Neuro deficit, acute, stroke suspected. Whole-body numbness, worse on left side. History of stroke with left-sided deficits. EXAM:  MRI HEAD WITHOUT AND WITH CONTRAST MRA HEAD WITHOUT CONTRAST MRA NECK WITHOUT AND WITH CONTRAST TECHNIQUE: Multiplanar, multi-echo pulse sequences of the brain and surrounding structures were acquired without and with intravenous contrast. Angiographic images of the Circle of Willis were acquired using MRA technique without intravenous contrast. Angiographic images of the neck were acquired using MRA technique without and with intravenous contrast. Carotid stenosis measurements (when applicable) are obtained utilizing NASCET criteria, using the distal internal carotid diameter as the denominator. 3D post processing, including maximum intensity projections and multiplanar reformations, was performed for better evaluation of the vasculature. CONTRAST:  10mL GADAVIST GADOBUTROL 1 MMOL/ML IV SOLN COMPARISON:  MRI brain 05/20/2022.  Head CT 11/21/2022. FINDINGS: MRI HEAD FINDINGS Brain: Subacute infarct in the left frontal operculum with petechial hemorrhage and mild surrounding edema. No significant mass effect or midline shift. Unchanged  background of moderate chronic small-vessel disease with numerous perforator infarcts in the bilateral basal ganglia, thalami and cerebral white matter. Sequela of prior hemorrhage in the right external capsule/corona radiata. Multiple chronic microhemorrhages in the basal ganglia and pons. No hydrocephalus or extra-axial collection. No mass effect or midline shift. Vascular: Normal flow voids and vessel enhancement. Skull and upper cervical spine: Normal marrow signal and enhancement. Sinuses/Orbits: No acute or significant finding. Other: None. MRA HEAD FINDINGS Anterior circulation: Intracranial ICAs are patent without stenosis or aneurysm. The proximal ACAs and MCAs are patent without stenosis or aneurysm. Distal branches are symmetric. Posterior circulation: Visualized portions of the distal vertebral arteries and basilar artery are patent without stenosis or aneurysm. The SCAs,  AICAs and PICAs are patent proximally. The PCAs are patent proximally without stenosis or aneurysm. Distal branches are symmetric. Anatomic variants: None. MRA NECK FINDINGS Aortic arch: Three-vessel arch configuration. Arch vessel origins are patent. Right carotid system: No evidence of dissection, stenosis (50% or greater), or occlusion. Tortuosity of the right cervical ICA. Left carotid system: No evidence of dissection, stenosis (50% or greater), or occlusion. Tortuosity of the left cervical ICA. Vertebral arteries: Left dominant. No evidence of dissection, stenosis (50% or greater), or occlusion. The right vertebral artery functionally terminates in PICA. Other: None. IMPRESSION: 1. Subacute infarct in the left frontal operculum with petechial hemorrhage and mild surrounding edema. No significant mass effect or midline shift. 2. No large vessel occlusion, hemodynamically significant stenosis, or aneurysm in the head or neck. Electronically Signed   By: Orvan Falconer M.D.   On: 11/21/2022 18:19   MR Angiogram Neck W or Wo Contrast  Result Date: 11/21/2022 CLINICAL DATA:  Neuro deficit, acute, stroke suspected. Whole-body numbness, worse on left side. History of stroke with left-sided deficits. EXAM: MRI HEAD WITHOUT AND WITH CONTRAST MRA HEAD WITHOUT CONTRAST MRA NECK WITHOUT AND WITH CONTRAST TECHNIQUE: Multiplanar, multi-echo pulse sequences of the brain and surrounding structures were acquired without and with intravenous contrast. Angiographic images of the Circle of Willis were acquired using MRA technique without intravenous contrast. Angiographic images of the neck were acquired using MRA technique without and with intravenous contrast. Carotid stenosis measurements (when applicable) are obtained utilizing NASCET criteria, using the distal internal carotid diameter as the denominator. 3D post processing, including maximum intensity projections and multiplanar reformations, was performed for better  evaluation of the vasculature. CONTRAST:  10mL GADAVIST GADOBUTROL 1 MMOL/ML IV SOLN COMPARISON:  MRI brain 05/20/2022.  Head CT 11/21/2022. FINDINGS: MRI HEAD FINDINGS Brain: Subacute infarct in the left frontal operculum with petechial hemorrhage and mild surrounding edema. No significant mass effect or midline shift. Unchanged background of moderate chronic small-vessel disease with numerous perforator infarcts in the bilateral basal ganglia, thalami and cerebral white matter. Sequela of prior hemorrhage in the right external capsule/corona radiata. Multiple chronic microhemorrhages in the basal ganglia and pons. No hydrocephalus or extra-axial collection. No mass effect or midline shift. Vascular: Normal flow voids and vessel enhancement. Skull and upper cervical spine: Normal marrow signal and enhancement. Sinuses/Orbits: No acute or significant finding. Other: None. MRA HEAD FINDINGS Anterior circulation: Intracranial ICAs are patent without stenosis or aneurysm. The proximal ACAs and MCAs are patent without stenosis or aneurysm. Distal branches are symmetric. Posterior circulation: Visualized portions of the distal vertebral arteries and basilar artery are patent without stenosis or aneurysm. The SCAs, AICAs and PICAs are patent proximally. The PCAs are patent proximally without stenosis or aneurysm. Distal branches are symmetric. Anatomic variants: None. MRA  NECK FINDINGS Aortic arch: Three-vessel arch configuration. Arch vessel origins are patent. Right carotid system: No evidence of dissection, stenosis (50% or greater), or occlusion. Tortuosity of the right cervical ICA. Left carotid system: No evidence of dissection, stenosis (50% or greater), or occlusion. Tortuosity of the left cervical ICA. Vertebral arteries: Left dominant. No evidence of dissection, stenosis (50% or greater), or occlusion. The right vertebral artery functionally terminates in PICA. Other: None. IMPRESSION: 1. Subacute infarct in the  left frontal operculum with petechial hemorrhage and mild surrounding edema. No significant mass effect or midline shift. 2. No large vessel occlusion, hemodynamically significant stenosis, or aneurysm in the head or neck. Electronically Signed   By: Orvan Falconer M.D.   On: 11/21/2022 18:19   CT Head Wo Contrast  Result Date: 11/21/2022 CLINICAL DATA:  Neuro deficit, acute, stroke suspected EXAM: CT HEAD WITHOUT CONTRAST TECHNIQUE: Contiguous axial images were obtained from the base of the skull through the vertex without intravenous contrast. RADIATION DOSE REDUCTION: This exam was performed according to the departmental dose-optimization program which includes automated exposure control, adjustment of the mA and/or kV according to patient size and/or use of iterative reconstruction technique. COMPARISON:  Brain MR 05/20/22 FINDINGS: Brain: Compared to prior exam there is new cytotoxic edema in the left frontal lobe with associated blood products this is favored to represent an acute infarct with associated petechial hemorrhage. No evidence of a parenchymal hematoma. No hydrocephalus. No extra-axial fluid collection. Sequela of moderate chronic microvascular ischemic change with chronic infarcts in the right corona radiata and left thalamus. No mass effect. No midline shift. Vascular: No hyperdense vessel or unexpected calcification. Skull: Normal. Negative for fracture or focal lesion. Sinuses/Orbits: No middle ear or mastoid effusion. Paranasal sinuses are clear. Orbits are unremarkable. Other: None. IMPRESSION: Compared to prior exam there is new cytotoxic edema in the left frontal lobe with associated blood products. This is favored to represent an acute to subacute infarct with likely petechial hemorrhage. No definite evidence of a parenchymal hematoma Electronically Signed   By: Lorenza Cambridge M.D.   On: 11/21/2022 15:41        Scheduled Meds:  atorvastatin  40 mg Oral q1800   busPIRone  7.5 mg  Oral BID   gabapentin  100 mg Oral BID   insulin aspart  0-5 Units Subcutaneous QHS   insulin aspart  0-9 Units Subcutaneous TID WC   pantoprazole  40 mg Oral BID AC   sertraline  50 mg Oral Daily   Continuous Infusions:   LOS: 0 days      Tresa Moore, MD [facility]   If 7PM-7AM, please contact night-coverage  11/22/2022, 1:48 PM

## 2022-11-22 NOTE — Evaluation (Signed)
Speech Language Pathology Evaluation Patient Details Name: Jeremiah Vance MRN: [id number] DOB: Aug 08, 1970 Today's Date: 11/22/2022 Time: 4270-6237 SLP Time Calculation (min) (ACUTE ONLY): 14 min  Problem List:  Patient Active Problem List   Diagnosis Date Noted   Acute CVA (cerebrovascular accident) (HCC) 11/22/2022   Stroke (HCC) 11/21/2022   Depression with anxiety 11/21/2022   Obesity (BMI 30-39.9) 11/21/2022   Hypomagnesemia 11/21/2022   Mood disorder (HCC) 10/29/2022   Type 2 diabetes mellitus with hyperglycemia, without long-term current use of insulin (HCC) 10/29/2022   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   Encounter for antineoplastic chemotherapy 10/02/2022   Chronic combined systolic and diastolic congestive heart failure (HCC) 09/27/2022   Chronic kidney disease, stage 3b (HCC) 09/27/2022   Type II diabetes mellitus with renal manifestations (HCC) 09/27/2022   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   Goals of care, counseling/discussion 09/19/2022   Anxiety associated with cancer diagnosis (HCC) 09/19/2022   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   IDA (iron deficiency anemia) 09/09/2022   Paroxysmal atrial fibrillation (HCC) 09/08/2022   Dysarthria 05/20/2022   Dyslipidemia 01/16/2022   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    HFrEF (heart failure with reduced ejection fraction) (HCC)    Other cirrhosis of liver (HCC)    History of stroke 10/17/2019   CKD (chronic kidney disease) stage 3, GFR 30-59 ml/min (HCC) 08/31/2019   DM (diabetes mellitus), type 2 with complications (HCC) 07/08/2019   Moderate obstructive sleep apnea 02/11/2018   Chronic fatigue 12/16/2017   Essential hypertension 12/16/2017   History of small bowel obstruction 10/24/2017   AKI (acute kidney injury) (HCC)    Hyperlipidemia    Gout    Past Medical History:  Past Medical History:  Diagnosis Date   Acute ischemic stroke (HCC) 09/06/2017   Acute  renal failure superimposed on stage 3a chronic kidney disease (HCC) 07/08/2019   Acute right-sided weakness    Allergies    Anasarca    Anemia 07/10/2017   Arthritis    Cancer (HCC)    stomach and esophageal cancer stage 4   CHF (congestive heart failure) (HCC)    "coinsided w/kidney problems I was having 06/2017"   Chicken pox    CKD (chronic kidney disease) stage 3, GFR 30-59 ml/min (HCC) 06/2017   Depression    Elevated troponin 07/08/2019   High cholesterol    History of cardiomyopathy    LVEF 40 to 45% in April 2019 - subsequently normalized   Hyperbilirubinemia 07/10/2017   Hypertension    Ischemic stroke (HCC)    Small left internal capsule infarct due to lacunar disease   Morbid obesity (HCC)    Normocytic anemia 12/16/2017   Recurrent incisional hernia with incarceration s/p repair 10/22/2017 10/21/2017   Stroke (HCC) 08/2017   "right sided weakness since; getting stronger though" (10/22/2017)   Type 2 diabetes mellitus [facility])    Past Surgical History:  Past Surgical History:  Procedure Laterality Date   ABDOMINAL HERNIA REPAIR  2008; 10/22/2017   "scope; OPEN REPAIR INCARCERATED VENTRAL HERNIA   ESOPHAGOGASTRODUODENOSCOPY (EGD) WITH PROPOFOL N/A 09/10/2022   Procedure: ESOPHAGOGASTRODUODENOSCOPY (EGD) WITH PROPOFOL;  Surgeon: Toney Reil, MD;  Location: [facility] ENDOSCOPY;  Service: Gastroenterology;  Laterality: N/A;   ESOPHAGOGASTRODUODENOSCOPY (EGD) WITH PROPOFOL N/A 09/28/2022   Procedure: ESOPHAGOGASTRODUODENOSCOPY (EGD) WITH PROPOFOL;  Surgeon: Jaynie Collins, DO;  Location: [facility] ENDOSCOPY;  Service: Gastroenterology;  Laterality: N/A;   HEMOSTASIS CONTROL  09/28/2022   Procedure: HEMOSTASIS CONTROL;  Surgeon: Timothy Lasso,  Thomas Hoff, DO;  Location: [facility] ENDOSCOPY;  Service: Gastroenterology;;   HERNIA REPAIR     KNEE ARTHROSCOPY Right 1989   PORTA CATH INSERTION N/A 09/25/2022   Procedure: PORTA CATH INSERTION;  Surgeon: Annice Needy, MD;  Location: [facility]  INVASIVE CV LAB;  Service: Cardiovascular;  Laterality: N/A;   VENTRAL HERNIA REPAIR N/A 10/22/2017   Procedure: OPEN REPAIR INCARCERATED VENTRAL HERNIA;  Surgeon: Glenna Fellows, MD;  Location: [facility] OR;  Service: General;  Laterality: N/A;   HPI:  Pt is a 52 year old male presenting to the ED with L sided numbness MRI of the brain with and without contrast showed subacute infarct in the left frontal operculum with petechial hemorrhage and mild surrounding edema. No significant mass effect or midline shift. MRA negative foir LVO. The MRI findings of left frontal operculum does not seem to explain his left-sided numbnes    PMH significant for GE junction carcinoma, HTN, HLD, DM, sCHF with EF 30-35%, stroke with mild left sided weakness, depression with anxiety, A-fib not on anticoagulants, obesity   Assessment / Plan / Recommendation Clinical Impression  Pt currently lives with his sister who manages his medicines, fixes his meals, performs household duties and pays all of the bills. Pt with baseline vision deficits. No caregiver present to establish baseline cognitive deficits but during this evaluation, pt demonstrated appropriately functional attention to task (self-feeding himself lunch tray). is oriented x 4 with 100% speech intelligibility during conversation with unfamiliar listener and unfamiliar context. Pt endorses some slurred speech but this was not present during this evaluation. At this time, skilled ST intervention doesn't appear indicated.    SLP Assessment  SLP Recommendation/Assessment: Patient does not need any further Speech Lanaguage Pathology Services SLP Visit Diagnosis: Dysarthria and anarthria (R47.1);Cognitive communication deficit (R41.841)    Recommendations for follow up therapy are one component of a multi-disciplinary discharge planning process, led by the attending physician.  Recommendations may be updated based on patient status, additional functional criteria and  insurance authorization.    Follow Up Recommendations  No SLP follow up    Assistance Recommended at Discharge   (defer to PT/OT recommendations)  Functional Status Assessment Patient has not had a recent decline in their functional status (with cognitive lingusitic abilities)  Frequency and Duration   N/A        SLP Evaluation Cognition  Overall Cognitive Status: No family/caregiver present to determine baseline cognitive functioning Arousal/Alertness: Awake/alert Orientation Level: Oriented X4       Comprehension  Auditory Comprehension Overall Auditory Comprehension: Appears within functional limits for tasks assessed Visual Recognition/Discrimination Discrimination: Not tested Reading Comprehension Reading Status: Not tested (d/t vision deficits)    Expression Expression Primary Mode of Expression: Verbal Verbal Expression Overall Verbal Expression: Appears within functional limits for tasks assessed Written Expression Dominant Hand: Right Written Expression: Not tested (pt self-feeding lunch without issues - his sister pays bills unsure of writing abilities (minimal opportunities at baseline))   Oral / Motor  Motor Speech Overall Motor Speech: Appears within functional limits for tasks assessed           Vale Mousseau B. Dreama Saa, M.S., CCC-SLP, Tree surgeon Certified Brain Injury Specialist [facility]  [facility] Rehabilitation Services Office [phone] Ascom [phone] Fax [phone]

## 2022-11-22 NOTE — Consult Note (Signed)
Hematology/Oncology Consult note [phone] [phone]      Patient Care Team: Debera Lat, PA-C as PCP - General (Physician Assistant) Debbe Odea, MD as PCP - Cardiology (Cardiology) Toney Reil, MD as Consulting Physician (Gastroenterology) Toney Reil, MD as Consulting Physician (Gastroenterology) Rickard Patience, MD as Consulting Physician (Oncology) Benita Gutter, RN as Oncology Nurse Navigator Ronney Asters, Jackelyn Poling, RPH-CPP as Pharmacist   Name of the patient: Jeremiah Vance  [id number]  11/06/1970   REASON FOR COSULTATION:   History of presenting illness-  52 y.o. male with PMH listed at below who presents to ER due to feeling left upper extremity numbness. He has history of stroke and has chronic lower extremity weakness  CT head showed new cytotoxic edema in the left frontal lobe with associated blood products. This is favored to represent an acute to subacute infarct with likely petechial hemorrhage. MRI brain w wo contrast  1. Subacute infarct in the left frontal operculum with petechial hemorrhage and mild surrounding edema. No significant mass effect or midline shift. 2. No large vessel occlusion, hemodynamically significant stenosis, or aneurysm in the head or neck.  Patient was admitted for stroke workup.  Evaluated by neurology.  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    Priority: High   Anxiety associated with cancer diagnosis (HCC) 09/19/2022    Priority: Medium    IDA (iron deficiency anemia) 09/09/2022    Priority: Medium    Other cirrhosis of liver  (HCC)     Priority: Medium    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    Priority: Low   Goals of care, counseling/discussion 09/19/2022    Priority: Low   Paroxysmal atrial fibrillation (HCC) 09/08/2022    Priority: Low   Acute CVA (cerebrovascular accident) (HCC) 11/22/2022   Stroke (HCC) 11/21/2022   Depression with anxiety 11/21/2022   Obesity (BMI 30-39.9) 11/21/2022   Hypomagnesemia 11/21/2022   Mood disorder (HCC) 10/29/2022   Type 2 diabetes mellitus with hyperglycemia, without long-term current use of insulin (HCC) 10/29/2022   Encounter for antineoplastic chemotherapy 10/02/2022   Chronic combined systolic and diastolic congestive heart failure (HCC) 09/27/2022   Chronic kidney disease, stage 3b (HCC) 09/27/2022   Type II diabetes mellitus with renal manifestations (HCC) 09/27/2022   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   Dysarthria 05/20/2022   Dyslipidemia 01/16/2022   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    HFrEF (heart failure with reduced ejection fraction) (HCC)    History of stroke 10/17/2019   CKD (chronic kidney disease) stage 3, GFR 30-59 ml/min (HCC) 08/31/2019   DM (diabetes mellitus), type 2 with complications (HCC) 07/08/2019   Moderate obstructive sleep apnea 02/11/2018   Chronic fatigue 12/16/2017   Essential hypertension 12/16/2017   History of small bowel obstruction 10/24/2017   AKI (acute kidney injury) (HCC)    Hyperlipidemia    Gout      Past Medical History:  Diagnosis Date   Acute ischemic stroke (HCC) 09/06/2017   Acute renal failure superimposed on stage 3a chronic kidney disease (HCC) 07/08/2019   Acute right-sided weakness    Allergies  Anasarca    Anemia 07/10/2017   Arthritis    Cancer (HCC)    stomach and esophageal cancer stage 4   CHF (congestive heart failure) (HCC)    "coinsided w/kidney problems I was having 06/2017"   Chicken pox    CKD (chronic kidney disease) stage 3,  GFR 30-59 ml/min (HCC) 06/2017   Depression    Elevated troponin 07/08/2019   High cholesterol    History of cardiomyopathy    LVEF 40 to 45% in April 2019 - subsequently normalized   Hyperbilirubinemia 07/10/2017   Hypertension    Ischemic stroke (HCC)    Small left internal capsule infarct due to lacunar disease   Morbid obesity (HCC)    Normocytic anemia 12/16/2017   Recurrent incisional hernia with incarceration s/p repair 10/22/2017 10/21/2017   Stroke (HCC) 08/2017   "right sided weakness since; getting stronger though" (10/22/2017)   Type 2 diabetes mellitus (HCC)      Past Surgical History:  Procedure Laterality Date   ABDOMINAL HERNIA REPAIR  2008; 10/22/2017   "scope; OPEN REPAIR INCARCERATED VENTRAL HERNIA   ESOPHAGOGASTRODUODENOSCOPY (EGD) WITH PROPOFOL N/A 09/10/2022   Procedure: ESOPHAGOGASTRODUODENOSCOPY (EGD) WITH PROPOFOL;  Surgeon: Toney Reil, MD;  Location: [facility] ENDOSCOPY;  Service: Gastroenterology;  Laterality: N/A;   ESOPHAGOGASTRODUODENOSCOPY (EGD) WITH PROPOFOL N/A 09/28/2022   Procedure: ESOPHAGOGASTRODUODENOSCOPY (EGD) WITH PROPOFOL;  Surgeon: Jaynie Collins, DO;  Location: [facility] ENDOSCOPY;  Service: Gastroenterology;  Laterality: N/A;   HEMOSTASIS CONTROL  09/28/2022   Procedure: HEMOSTASIS CONTROL;  Surgeon: Jaynie Collins, DO;  Location: [facility] ENDOSCOPY;  Service: Gastroenterology;;   HERNIA REPAIR     KNEE ARTHROSCOPY Right 1989   PORTA CATH INSERTION N/A 09/25/2022   Procedure: PORTA CATH INSERTION;  Surgeon: Annice Needy, MD;  Location: [facility] INVASIVE CV LAB;  Service: Cardiovascular;  Laterality: N/A;   VENTRAL HERNIA REPAIR N/A 10/22/2017   Procedure: OPEN REPAIR INCARCERATED VENTRAL HERNIA;  Surgeon: Glenna Fellows, MD;  Location: [facility] OR;  Service: General;  Laterality: N/A;    Social History   Socioeconomic History   Marital status: Divorced    Spouse name: Not on file   Number of children: Not on file   Years of  education: Not on file   Highest education level: Not on file  Occupational History   Not on file  Tobacco Use   Smoking status: Never   Smokeless tobacco: Never  Vaping Use   Vaping status: Never Used  Substance and Sexual Activity   Alcohol use: Yes    Comment: occasional beer   Drug use: Never   Sexual activity: Not Currently  Other Topics Concern   Not on file  Social History Narrative   He lives with his sister , [name] and she is his MPOA after his stokes   Social Determinants of Health   Financial Resource Strain: High Risk (03/20/2020)   Overall Financial Resource Strain (CARDIA)    Difficulty of Paying Living Expenses: Hard  Food Insecurity: No Food Insecurity (11/22/2022)   Hunger Vital Sign    Worried About Running Out of Food in the Last Year: Never true    Ran Out of Food in the Last Year: Never true  Transportation Needs: No Transportation Needs (11/22/2022)   PRAPARE - Administrator, Civil Service (Medical): No    Lack of Transportation (Non-Medical): No  Recent Concern: Transportation Needs - Unmet Transportation Needs (09/27/2022)   PRAPARE - Transportation    Lack of Transportation (  Medical): No    Lack of Transportation (Non-Medical): Yes  Physical Activity: Not on file  Stress: Not on file  Social Connections: Not on file  Intimate Partner Violence: Not At Risk (11/22/2022)   Humiliation, Afraid, Rape, and Kick questionnaire    Fear of Current or Ex-Partner: No    Emotionally Abused: No    Physically Abused: No    Sexually Abused: No     Family History  Problem Relation Age of Onset   Diabetes Mother    Stroke Mother    Arthritis Mother    Depression Mother    Heart disease Mother    Hypertension Mother    Learning disabilities Mother    Mental illness Mother    Sleep apnea Mother    Diabetes Father    Heart disease Father    Arthritis Father    Hearing loss Father    Hyperlipidemia Father    Heart attack Father     Hypertension Father    Stroke Father    Diabetes Sister    Depression Sister    Diabetes Sister    Hypertension Sister    Mental illness Sister    Sleep apnea Sister    Diabetes Maternal Grandmother    Heart disease Maternal Grandmother    Depression Maternal Grandmother    Hyperlipidemia Maternal Grandmother    Hypertension Maternal Grandmother    Stroke Maternal Grandmother    Hypertension Maternal Grandfather    Hyperlipidemia Maternal Grandfather    Stroke Maternal Grandfather    Arthritis Paternal Grandmother    Hearing loss Paternal Grandmother    Stroke Paternal Grandfather    Heart disease Paternal Grandfather    Arthritis Paternal Grandfather    Heart attack Paternal Grandfather    Melanoma Other      Current Facility-Administered Medications:    acetaminophen (TYLENOL) tablet 650 mg, 650 mg, Oral, Q4H PRN **OR** acetaminophen (TYLENOL) 160 MG/5ML solution 650 mg, 650 mg, Per Tube, Q4H PRN **OR** acetaminophen (TYLENOL) suppository 650 mg, 650 mg, Rectal, Q4H PRN, Lorretta Harp, MD   atorvastatin (LIPITOR) tablet 40 mg, 40 mg, Oral, q1800, Lorretta Harp, MD   busPIRone (BUSPAR) tablet 7.5 mg, 7.5 mg, Oral, BID, Lorretta Harp, MD, 7.5 mg at 11/22/22 0940   gabapentin (NEURONTIN) capsule 100 mg, 100 mg, Oral, BID, Lorretta Harp, MD, 100 mg at 11/22/22 0941   hydrALAZINE (APRESOLINE) injection 5 mg, 5 mg, Intravenous, Q2H PRN, Lorretta Harp, MD   insulin aspart (novoLOG) injection 0-5 Units, 0-5 Units, Subcutaneous, QHS, Niu, Xilin, MD   insulin aspart (novoLOG) injection 0-9 Units, 0-9 Units, Subcutaneous, TID WC, Lorretta Harp, MD, 2 Units at 11/22/22 1237   ondansetron (ZOFRAN) injection 4 mg, 4 mg, Intravenous, Q8H PRN, Lorretta Harp, MD   oxyCODONE (Oxy IR/ROXICODONE) immediate release tablet 5-10 mg, 5-10 mg, Oral, Q4H PRN, Georgeann Oppenheim, Sudheer B, MD, 5 mg at 11/22/22 0939   pantoprazole (PROTONIX) EC tablet 40 mg, 40 mg, Oral, BID AC, Lorretta Harp, MD, 40 mg at 11/22/22 0900   senna-docusate  (Senokot-S) tablet 1 tablet, 1 tablet, Oral, QHS PRN, Lorretta Harp, MD   sertraline (ZOLOFT) tablet 50 mg, 50 mg, Oral, Daily, Lorretta Harp, MD, 50 mg at 11/22/22 0941   torsemide (DEMADEX) tablet 40 mg, 40 mg, Oral, Daily PRN, Lorretta Harp, MD  Review of Systems  Constitutional:  Positive for fatigue. Negative for chills and fever.  HENT:   Negative for hearing loss and voice change.   Eyes:  Negative for eye  problems and icterus.  Respiratory:  Negative for chest tightness, cough and shortness of breath.   Cardiovascular:  Negative for chest pain and leg swelling.  Gastrointestinal:  Negative for abdominal distention and abdominal pain.  Endocrine: Negative for hot flashes.  Genitourinary:  Negative for difficulty urinating, dysuria and frequency.   Musculoskeletal:  Negative for arthralgias.  Skin:  Negative for itching and rash.  Neurological:  Positive for numbness. Negative for light-headedness.       Chronic left upper and lower extremity weakness  Hematological:  Negative for adenopathy. Does not bruise/bleed easily.  Psychiatric/Behavioral:  Negative for confusion.     PHYSICAL EXAM Vitals:   11/22/22 0100 11/22/22 0516 11/22/22 0905 11/22/22 1230  BP: (!) 164/90 (!) 150/84 (!) 154/84 (!) 145/97  Pulse: (!) 109 89 83 91  Resp: 18 18 18 18   Temp: 98.6 F (37 C) 98.3 F (36.8 C) 98.3 F (36.8 C) 97.7 F (36.5 C)  TempSrc: Oral     SpO2: 98% 94% 96% 97%  Weight: 244 lb 11.4 oz (111 kg)     Height: 5\' 10"  (1.778 m)      Physical Exam Constitutional:      General: He is not in acute distress.    Appearance: He is not diaphoretic.  HENT:     Head: Normocephalic and atraumatic.     Nose: Nose normal.     Mouth/Throat:     Pharynx: No oropharyngeal exudate.  Eyes:     General: No scleral icterus.    Pupils: Pupils are equal, round, and reactive to light.  Cardiovascular:     Rate and Rhythm: Normal rate and regular rhythm.     Heart sounds: No murmur heard. Pulmonary:      Effort: Pulmonary effort is normal. No respiratory distress.  Abdominal:     General: There is no distension.     Palpations: Abdomen is soft.  Musculoskeletal:        General: Normal range of motion.     Cervical back: Normal range of motion and neck supple.     Comments: Chronic left sided weakness.  Skin:    General: Skin is warm and dry.     Findings: No erythema.  Neurological:     Mental Status: He is alert and oriented to person, place, and time.     Cranial Nerves: No cranial nerve deficit.     Motor: No abnormal muscle tone.     Coordination: Coordination normal.  Psychiatric:        Mood and Affect: Mood and affect normal.       LABORATORY STUDIES    Latest Ref Rng & Units 11/21/2022    2:50 PM 11/12/2022    8:50 AM 10/29/2022    9:24 AM  CBC  WBC 4.0 - 10.5 K/uL 6.4  5.8  7.0   Hemoglobin 13.0 - 17.0 g/dL 29.5  9.2  9.8   Hematocrit 39.0 - 52.0 % 31.9  30.4  31.1   Platelets 150 - 400 K/uL 248  191  267       Latest Ref Rng & Units 11/21/2022    2:50 PM 11/12/2022    8:50 AM 10/29/2022    9:24 AM  CMP  Glucose 70 - 99 mg/dL 621  308  657   BUN 6 - 20 mg/dL 41  26  47   Creatinine 0.61 - 1.24 mg/dL 8.46  9.62  9.52   Sodium 135 - 145 mmol/L 135  139  137   Potassium 3.5 - 5.1 mmol/L 3.6  3.7  3.5   Chloride 98 - 111 mmol/L 103  112  103   CO2 22 - 32 mmol/L 27  21  25    Calcium 8.9 - 10.3 mg/dL 8.3  8.1  8.3   Total Protein 6.5 - 8.1 g/dL 7.2  6.5  7.3   Total Bilirubin 0.3 - 1.2 mg/dL 0.9  0.4  0.6   Alkaline Phos 38 - 126 U/L 88  93  69   AST 15 - 41 U/L 16  16  18    ALT 0 - 44 U/L 16  18  12       RADIOGRAPHIC STUDIES: I have personally reviewed the radiological images as listed and agreed with the findings in the report. ECHOCARDIOGRAM COMPLETE  Result Date: 11/22/2022    ECHOCARDIOGRAM REPORT   Patient Name:   ROWLAND SOONG Date of Exam: 11/22/2022 Medical Rec #:  [id number]       Height:       70.0 in Accession #:    [id number]      Weight:        244.7 lb Date of Birth:  [birth year]       BSA:          2.274 m Patient Age:    51 years        BP:           154/84 mmHg Patient Gender: M               HR:           83 bpm. Exam Location:  [facility] Procedure: 2D Echo, Cardiac Doppler and Color Doppler Indications:     Stroke I63.9  History:         Patient has prior history of Echocardiogram examinations, most                  recent 11/23/2021. CHF, Stroke; Risk Factors:Hypertension.  Sonographer:     Cristela Blue Referring Phys:  [id number] Brien Few NIU Diagnosing Phys: Debbe Odea MD  Sonographer Comments: Suboptimal apical window. IMPRESSIONS  1. Left ventricular ejection fraction, by estimation, is 55 to 60%. The left ventricle has normal function. The left ventricle has no regional wall motion abnormalities. There is mild left ventricular hypertrophy. Left ventricular diastolic parameters are indeterminate.  2. Right ventricular systolic function is low normal. The right ventricular size is mildly enlarged. There is normal pulmonary artery systolic pressure.  3. Left atrial size was mildly dilated.  4. The mitral valve is normal in structure. Mild mitral valve regurgitation.  5. The aortic valve is tricuspid. Aortic valve regurgitation is not visualized.  6. Aortic dilatation noted. There is borderline dilatation of the aortic root, measuring 38 mm.  7. The inferior vena cava is dilated in size with <50% respiratory variability, suggesting right atrial pressure of 15 mmHg. FINDINGS  Left Ventricle: Left ventricular ejection fraction, by estimation, is 55 to 60%. The left ventricle has normal function. The left ventricle has no regional wall motion abnormalities. The left ventricular internal cavity size was normal in size. There is  mild left ventricular hypertrophy. Left ventricular diastolic parameters are indeterminate. Right Ventricle: The right ventricular size is mildly enlarged. No increase in right ventricular wall thickness. Right ventricular systolic  function is low normal. There is normal pulmonary artery systolic pressure. The tricuspid regurgitant velocity is 1.75 m/s, and with an assumed right atrial pressure of  15 mmHg, the estimated right ventricular systolic pressure is 27.2 mmHg. Left Atrium: Left atrial size was mildly dilated. Right Atrium: Right atrial size was normal in size. Pericardium: There is no evidence of pericardial effusion. Mitral Valve: The mitral valve is normal in structure. Mild mitral valve regurgitation. Tricuspid Valve: The tricuspid valve is normal in structure. Tricuspid valve regurgitation is not demonstrated. Aortic Valve: The aortic valve is tricuspid. Aortic valve regurgitation is not visualized. Aortic valve mean gradient measures 2.0 mmHg. Aortic valve peak gradient measures 3.2 mmHg. Aortic valve area, by VTI measures 3.12 cm. Pulmonic Valve: The pulmonic valve was normal in structure. Pulmonic valve regurgitation is not visualized. Aorta: Aortic dilatation noted. There is borderline dilatation of the aortic root, measuring 38 mm. Venous: The inferior vena cava is dilated in size with less than 50% respiratory variability, suggesting right atrial pressure of 15 mmHg. IAS/Shunts: No atrial level shunt detected by color flow Doppler.  LEFT VENTRICLE PLAX 2D LVIDd:         4.80 cm LVIDs:         3.30 cm LV PW:         2.00 cm LV IVS:        1.60 cm LVOT diam:     2.30 cm LV SV:         40 LV SV Index:   18 LVOT Area:     4.15 cm  RIGHT VENTRICLE RV Basal diam:  4.80 cm RV Mid diam:    3.80 cm RV S prime:     12.40 cm/s TAPSE (M-mode): 2.6 cm LEFT ATRIUM           Index        RIGHT ATRIUM           Index LA diam:      4.90 cm 2.15 cm/m   RA Area:     19.80 cm LA Vol (A2C): 61.8 ml 27.17 ml/m  RA Volume:   58.60 ml  25.77 ml/m LA Vol (A4C): 78.6 ml 34.56 ml/m  AORTIC VALVE AV Area (Vmax):    2.79 cm AV Area (Vmean):   2.93 cm AV Area (VTI):     3.12 cm AV Vmax:           89.05 cm/s AV Vmean:          58.200 cm/s AV  VTI:            0.130 m AV Peak Grad:      3.2 mmHg AV Mean Grad:      2.0 mmHg LVOT Vmax:         59.90 cm/s LVOT Vmean:        41.000 cm/s LVOT VTI:          0.097 m LVOT/AV VTI ratio: 0.75  AORTA Ao Root diam: 3.80 cm MITRAL VALVE               TRICUSPID VALVE MV Area (PHT): 5.58 cm    TR Peak grad:   12.2 mmHg MV Decel Time: 136 msec    TR Vmax:        175.00 cm/s MV E velocity: 92.20 cm/s                            SHUNTS  Systemic VTI:  0.10 m                            Systemic Diam: 2.30 cm Debbe Odea MD Electronically signed by Debbe Odea MD Signature Date/Time: 11/22/2022/12:49:58 PM    Final    US Venous Img Lower Unilateral Left (DVT)  Result Date: 11/22/2022 CLINICAL DATA:  Left lower extremity pain and swelling EXAM: LEFT LOWER EXTREMITY VENOUS DOPPLER ULTRASOUND TECHNIQUE: Gray-scale sonography with compression, as well as color and duplex ultrasound, were performed to evaluate the deep venous system(s) from the level of the common femoral vein through the popliteal and proximal calf veins. COMPARISON:  None Available. FINDINGS: VENOUS Normal compressibility of the common femoral, superficial femoral, and popliteal veins, as well as the visualized calf veins. Visualized portions of profunda femoral vein and great saphenous vein unremarkable. No filling defects to suggest DVT on grayscale or color Doppler imaging. Doppler waveforms show normal direction of venous flow, normal respiratory plasticity and response to augmentation. Limited views of the contralateral common femoral vein are unremarkable. OTHER None. Limitations: none IMPRESSION: Negative. Electronically Signed   By: Malachy Moan M.D.   On: 11/22/2022 11:00   MR Brain W and Wo Contrast  Result Date: 11/21/2022 CLINICAL DATA:  Neuro deficit, acute, stroke suspected. Whole-body numbness, worse on left side. History of stroke with left-sided deficits. EXAM: MRI HEAD WITHOUT AND WITH CONTRAST  MRA HEAD WITHOUT CONTRAST MRA NECK WITHOUT AND WITH CONTRAST TECHNIQUE: Multiplanar, multi-echo pulse sequences of the brain and surrounding structures were acquired without and with intravenous contrast. Angiographic images of the Circle of Willis were acquired using MRA technique without intravenous contrast. Angiographic images of the neck were acquired using MRA technique without and with intravenous contrast. Carotid stenosis measurements (when applicable) are obtained utilizing NASCET criteria, using the distal internal carotid diameter as the denominator. 3D post processing, including maximum intensity projections and multiplanar reformations, was performed for better evaluation of the vasculature. CONTRAST:  10mL GADAVIST GADOBUTROL 1 MMOL/ML IV SOLN COMPARISON:  MRI brain 05/20/2022.  Head CT 11/21/2022. FINDINGS: MRI HEAD FINDINGS Brain: Subacute infarct in the left frontal operculum with petechial hemorrhage and mild surrounding edema. No significant mass effect or midline shift. Unchanged background of moderate chronic small-vessel disease with numerous perforator infarcts in the bilateral basal ganglia, thalami and cerebral white matter. Sequela of prior hemorrhage in the right external capsule/corona radiata. Multiple chronic microhemorrhages in the basal ganglia and pons. No hydrocephalus or extra-axial collection. No mass effect or midline shift. Vascular: Normal flow voids and vessel enhancement. Skull and upper cervical spine: Normal marrow signal and enhancement. Sinuses/Orbits: No acute or significant finding. Other: None. MRA HEAD FINDINGS Anterior circulation: Intracranial ICAs are patent without stenosis or aneurysm. The proximal ACAs and MCAs are patent without stenosis or aneurysm. Distal branches are symmetric. Posterior circulation: Visualized portions of the distal vertebral arteries and basilar artery are patent without stenosis or aneurysm. The SCAs, AICAs and PICAs are patent  proximally. The PCAs are patent proximally without stenosis or aneurysm. Distal branches are symmetric. Anatomic variants: None. MRA NECK FINDINGS Aortic arch: Three-vessel arch configuration. Arch vessel origins are patent. Right carotid system: No evidence of dissection, stenosis (50% or greater), or occlusion. Tortuosity of the right cervical ICA. Left carotid system: No evidence of dissection, stenosis (50% or greater), or occlusion. Tortuosity of the left cervical ICA. Vertebral arteries: Left dominant. No evidence of dissection, stenosis (50% or greater), or  occlusion. The right vertebral artery functionally terminates in PICA. Other: None. IMPRESSION: 1. Subacute infarct in the left frontal operculum with petechial hemorrhage and mild surrounding edema. No significant mass effect or midline shift. 2. No large vessel occlusion, hemodynamically significant stenosis, or aneurysm in the head or neck. Electronically Signed   By: Orvan Falconer M.D.   On: 11/21/2022 18:19   MR ANGIO HEAD WO CONTRAST  Result Date: 11/21/2022 CLINICAL DATA:  Neuro deficit, acute, stroke suspected. Whole-body numbness, worse on left side. History of stroke with left-sided deficits. EXAM: MRI HEAD WITHOUT AND WITH CONTRAST MRA HEAD WITHOUT CONTRAST MRA NECK WITHOUT AND WITH CONTRAST TECHNIQUE: Multiplanar, multi-echo pulse sequences of the brain and surrounding structures were acquired without and with intravenous contrast. Angiographic images of the Circle of Willis were acquired using MRA technique without intravenous contrast. Angiographic images of the neck were acquired using MRA technique without and with intravenous contrast. Carotid stenosis measurements (when applicable) are obtained utilizing NASCET criteria, using the distal internal carotid diameter as the denominator. 3D post processing, including maximum intensity projections and multiplanar reformations, was performed for better evaluation of the vasculature.  CONTRAST:  10mL GADAVIST GADOBUTROL 1 MMOL/ML IV SOLN COMPARISON:  MRI brain 05/20/2022.  Head CT 11/21/2022. FINDINGS: MRI HEAD FINDINGS Brain: Subacute infarct in the left frontal operculum with petechial hemorrhage and mild surrounding edema. No significant mass effect or midline shift. Unchanged background of moderate chronic small-vessel disease with numerous perforator infarcts in the bilateral basal ganglia, thalami and cerebral white matter. Sequela of prior hemorrhage in the right external capsule/corona radiata. Multiple chronic microhemorrhages in the basal ganglia and pons. No hydrocephalus or extra-axial collection. No mass effect or midline shift. Vascular: Normal flow voids and vessel enhancement. Skull and upper cervical spine: Normal marrow signal and enhancement. Sinuses/Orbits: No acute or significant finding. Other: None. MRA HEAD FINDINGS Anterior circulation: Intracranial ICAs are patent without stenosis or aneurysm. The proximal ACAs and MCAs are patent without stenosis or aneurysm. Distal branches are symmetric. Posterior circulation: Visualized portions of the distal vertebral arteries and basilar artery are patent without stenosis or aneurysm. The SCAs, AICAs and PICAs are patent proximally. The PCAs are patent proximally without stenosis or aneurysm. Distal branches are symmetric. Anatomic variants: None. MRA NECK FINDINGS Aortic arch: Three-vessel arch configuration. Arch vessel origins are patent. Right carotid system: No evidence of dissection, stenosis (50% or greater), or occlusion. Tortuosity of the right cervical ICA. Left carotid system: No evidence of dissection, stenosis (50% or greater), or occlusion. Tortuosity of the left cervical ICA. Vertebral arteries: Left dominant. No evidence of dissection, stenosis (50% or greater), or occlusion. The right vertebral artery functionally terminates in PICA. Other: None. IMPRESSION: 1. Subacute infarct in the left frontal operculum with  petechial hemorrhage and mild surrounding edema. No significant mass effect or midline shift. 2. No large vessel occlusion, hemodynamically significant stenosis, or aneurysm in the head or neck. Electronically Signed   By: Orvan Falconer M.D.   On: 11/21/2022 18:19   MR Angiogram Neck W or Wo Contrast  Result Date: 11/21/2022 CLINICAL DATA:  Neuro deficit, acute, stroke suspected. Whole-body numbness, worse on left side. History of stroke with left-sided deficits. EXAM: MRI HEAD WITHOUT AND WITH CONTRAST MRA HEAD WITHOUT CONTRAST MRA NECK WITHOUT AND WITH CONTRAST TECHNIQUE: Multiplanar, multi-echo pulse sequences of the brain and surrounding structures were acquired without and with intravenous contrast. Angiographic images of the Circle of Willis were acquired using MRA technique without intravenous contrast. Angiographic images  of the neck were acquired using MRA technique without and with intravenous contrast. Carotid stenosis measurements (when applicable) are obtained utilizing NASCET criteria, using the distal internal carotid diameter as the denominator. 3D post processing, including maximum intensity projections and multiplanar reformations, was performed for better evaluation of the vasculature. CONTRAST:  10mL GADAVIST GADOBUTROL 1 MMOL/ML IV SOLN COMPARISON:  MRI brain 05/20/2022.  Head CT 11/21/2022. FINDINGS: MRI HEAD FINDINGS Brain: Subacute infarct in the left frontal operculum with petechial hemorrhage and mild surrounding edema. No significant mass effect or midline shift. Unchanged background of moderate chronic small-vessel disease with numerous perforator infarcts in the bilateral basal ganglia, thalami and cerebral white matter. Sequela of prior hemorrhage in the right external capsule/corona radiata. Multiple chronic microhemorrhages in the basal ganglia and pons. No hydrocephalus or extra-axial collection. No mass effect or midline shift. Vascular: Normal flow voids and vessel  enhancement. Skull and upper cervical spine: Normal marrow signal and enhancement. Sinuses/Orbits: No acute or significant finding. Other: None. MRA HEAD FINDINGS Anterior circulation: Intracranial ICAs are patent without stenosis or aneurysm. The proximal ACAs and MCAs are patent without stenosis or aneurysm. Distal branches are symmetric. Posterior circulation: Visualized portions of the distal vertebral arteries and basilar artery are patent without stenosis or aneurysm. The SCAs, AICAs and PICAs are patent proximally. The PCAs are patent proximally without stenosis or aneurysm. Distal branches are symmetric. Anatomic variants: None. MRA NECK FINDINGS Aortic arch: Three-vessel arch configuration. Arch vessel origins are patent. Right carotid system: No evidence of dissection, stenosis (50% or greater), or occlusion. Tortuosity of the right cervical ICA. Left carotid system: No evidence of dissection, stenosis (50% or greater), or occlusion. Tortuosity of the left cervical ICA. Vertebral arteries: Left dominant. No evidence of dissection, stenosis (50% or greater), or occlusion. The right vertebral artery functionally terminates in PICA. Other: None. IMPRESSION: 1. Subacute infarct in the left frontal operculum with petechial hemorrhage and mild surrounding edema. No significant mass effect or midline shift. 2. No large vessel occlusion, hemodynamically significant stenosis, or aneurysm in the head or neck. Electronically Signed   By: Orvan Falconer M.D.   On: 11/21/2022 18:19   CT Head Wo Contrast  Result Date: 11/21/2022 CLINICAL DATA:  Neuro deficit, acute, stroke suspected EXAM: CT HEAD WITHOUT CONTRAST TECHNIQUE: Contiguous axial images were obtained from the base of the skull through the vertex without intravenous contrast. RADIATION DOSE REDUCTION: This exam was performed according to the departmental dose-optimization program which includes automated exposure control, adjustment of the mA and/or kV  according to patient size and/or use of iterative reconstruction technique. COMPARISON:  Brain MR 05/20/22 FINDINGS: Brain: Compared to prior exam there is new cytotoxic edema in the left frontal lobe with associated blood products this is favored to represent an acute infarct with associated petechial hemorrhage. No evidence of a parenchymal hematoma. No hydrocephalus. No extra-axial fluid collection. Sequela of moderate chronic microvascular ischemic change with chronic infarcts in the right corona radiata and left thalamus. No mass effect. No midline shift. Vascular: No hyperdense vessel or unexpected calcification. Skull: Normal. Negative for fracture or focal lesion. Sinuses/Orbits: No middle ear or mastoid effusion. Paranasal sinuses are clear. Orbits are unremarkable. Other: None. IMPRESSION: Compared to prior exam there is new cytotoxic edema in the left frontal lobe with associated blood products. This is favored to represent an acute to subacute infarct with likely petechial hemorrhage. No definite evidence of a parenchymal hematoma Electronically Signed   By: Lorenza Cambridge M.D.   On:  11/21/2022 15:41   US Venous Img Lower Unilateral Left  Result Date: 10/29/2022 CLINICAL DATA:  Left lower extremity swelling. EXAM: Left LOWER EXTREMITY VENOUS DOPPLER ULTRASOUND TECHNIQUE: Gray-scale sonography with compression, as well as color and duplex ultrasound, were performed to evaluate the deep venous system(s) from the level of the common femoral vein through the popliteal and proximal calf veins. COMPARISON:  None Available. FINDINGS: VENOUS Normal compressibility of the common femoral, superficial femoral, and popliteal veins, as well as the visualized calf veins. Visualized portions of profunda femoral vein and great saphenous vein unremarkable. No filling defects to suggest DVT on grayscale or color Doppler imaging. Doppler waveforms show normal direction of venous flow, normal respiratory plasticity and  response to augmentation. Limited views of the contralateral common femoral vein are unremarkable. OTHER None. Limitations: none IMPRESSION: Negative. Electronically Signed   By: Lupita Raider M.D.   On: 10/29/2022 15:38   DG Foot Complete Left  Result Date: 10/27/2022 CLINICAL DATA:  Left foot pain. EXAM: LEFT FOOT - COMPLETE 3+ VIEW COMPARISON:  September 06, 2017. FINDINGS: There is no evidence of fracture or dislocation. There is no evidence of arthropathy or other focal bone abnormality. Vascular calcifications are noted. IMPRESSION: No acute abnormality is noted. Electronically Signed   By: Lupita Raider M.D.   On: 10/27/2022 13:43   DG Chest 2 View  Result Date: 10/27/2022 CLINICAL DATA:  Sepsis. EXAM: CHEST - 2 VIEW COMPARISON:  May 18, 2022. FINDINGS: Stable cardiomediastinal silhouette. Right internal jugular Port-A-Cath is noted with distal tip in expected position of the SVC. Both lungs are clear. The visualized skeletal structures are unremarkable. IMPRESSION: No active cardiopulmonary disease. Electronically Signed   By: Lupita Raider M.D.   On: 10/27/2022 13:42   PERIPHERAL VASCULAR CATHETERIZATION  Result Date: 09/25/2022 See surgical note for result.  NM PET Image Initial (PI) Skull Base To Thigh  Result Date: 09/23/2022 CLINICAL DATA:  Initial treatment strategy for peritoneal carcinomatosis. EXAM: NUCLEAR MEDICINE PET SKULL BASE TO THIGH TECHNIQUE: 13.2 mCi F-18 FDG was injected intravenously. Full-ring PET imaging was performed from the skull base to thigh after the radiotracer. CT data was obtained and used for attenuation correction and anatomic localization. Fasting blood glucose: 120 mg/dl COMPARISON:  CT abdomen pelvis 09/08/2022. FINDINGS: Mediastinal blood pool activity: SUV max 2.4 Liver activity: SUV max 3.4 NECK: No abnormal hypermetabolism. Incidental CT findings: None. CHEST: Hypermetabolic thoracic inlet, mediastinal and distal periesophageal adenopathy. Index  high right paratracheal lymph node measures 1.6 cm, SUV max 18.7. Incidental CT findings: Atherosclerotic calcification of the aorta, aortic valve and coronary arteries. Enlarged pulmonic trunk and heart. No pericardial or pleural effusion. Scattered tiny pulmonary nodules are too small for PET resolution. ABDOMEN/PELVIS: Proximal gastric masslike wall thickening with associated hypermetabolism, SUV max 16.8. Extensive hypermetabolic retrocrural, abdominal peritoneal ligament, abdominal retroperitoneal and small bowel mesenteric adenopathy. Index left periaortic lymph node measures 1.6 cm (4/98), SUV max 14.4. Hypermetabolic omental nodularity with an index nodule in the left upper quadrant measuring 5 mm, SUV max 4.9. No additional abnormal hypermetabolism. Incidental CT findings: Liver is enlarged, 19.6 cm, and the margin is irregular. Stones in the gallbladder. Adrenal glands are unremarkable. Bilateral renal stones and renal vascular calcifications. Spleen and pancreas are grossly unremarkable. Unobstructed small bowel extends into a large umbilical hernia with a wide neck. A second left paramidline umbilical hernia contains fat. SKELETON: No abnormal hypermetabolism. Incidental CT findings: Degenerative changes in the spine. Probable vertebral body hemangiomas. IMPRESSION:  1. 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 2. Enlarged cirrhotic liver. 3. Cholelithiasis. 4. Bilateral renal stones. 5. Umbilical hernias contain unobstructed small bowel and/or fat. 6. Aortic atherosclerosis (ICD10-I70.0). Coronary artery calcification. 7. Enlarged pulmonic trunk, indicative of pulmonary arterial hypertension. Electronically Signed   By: Leanna Battles M.D.   On: 09/23/2022 11:44   CT ABDOMEN PELVIS W CONTRAST  Result Date: 09/08/2022 CLINICAL DATA:  Worsening abdominal pain. EXAM: CT ABDOMEN AND PELVIS WITH CONTRAST TECHNIQUE: Multidetector CT imaging of the abdomen and  pelvis was performed using the standard protocol following bolus administration of intravenous contrast. RADIATION DOSE REDUCTION: This exam was performed according to the departmental dose-optimization program which includes automated exposure control, adjustment of the mA and/or kV according to patient size and/or use of iterative reconstruction technique. CONTRAST:  OMNIPAQUE IOHEXOL 350 MG/ML SOLN COMPARISON:  Noncontrast CT 02/20/2020. FINDINGS: Lower chest: Breathing motion at the lung bases. No pleural effusion. There are several small lung nodules identified. Example middle lobe series 4, image 17 measuring 5 mm. Other lesions identified are also similar to smaller in size. Distribution is similar to the previous examination. Over 2 years of stability. No specific imaging follow-up. Heart is enlarged. Coronary artery calcifications are seen. Please correlate for other coronary risk factors. Hepatobiliary: No enhancing liver lesion. Patent portal vein. Stones in the nondilated gallbladder. Pancreas: Mild atrophy of the pancreatic parenchyma. Spleen: Normal in size without focal abnormality. Adrenals/Urinary Tract: Stable left adrenal myelolipoma. Right adrenal gland is preserved. No enhancing renal mass or collecting system dilatation. The calcifications seen along each kidney actually could be vascular based on the distribution. No collecting system dilatation. The ureters have normal course and caliber down to the bladder. Preserved contours of the urinary bladder. Stomach/Bowel: No oral contrast. The large bowel has a normal course and caliber with scattered stool and diverticula. Normal appendix. Small bowel is nondilated. There is nodular wall thickening along the upper stomach and GE junction region. Please correlate for a malignant lesion. Vascular/Lymphatic: Normal caliber aorta and IVC with scattered vascular calcifications. Extensive abnormal lymph node enlargement identified. This includes  retrocrural on series 2, image 13 measuring 19 x 14 mm. Gastrohepatic ligament, periceliac region node left of midline on series 2, image 23 measuring 4.0 by 3.5 cm. Porta hepatic node series 2, image 25 measuring 3.4 by 2.4 cm. Left para-aortic lymph node more caudal on series 2, image 35 measuring 2.8 by 2.2 cm. Several other nodes are seen throughout these locations. There is also some mesenteric nodes, including along the greater curve of the stomach. Reproductive: Prostate is unremarkable. Other: Large midline anterior pelvic wall hernia with rectus muscle diastasis. Herniation of small bowel and significant mesenteric fat. Scattered stranding. There are areas of nodularity along the peritoneum anteriorly such as left upper quadrant image 35 of series 2. There is some thickening of the lateral conal fascia on the left side. Please correlate for evidence of peritoneal carcinomatosis. No significant ascites. No free air. Musculoskeletal: Scattered degenerative changes of the spine and pelvis. Transitional lumbosacral segment. Skin thickening along the anterior pelvic wall and stranding. Mild anasarca. IMPRESSION: Diffuse masslike thickening in the area of the GE junction and upper stomach with the extensive abnormal lymph nodes throughout the abdomen including retrocrural, porta hepatis, retroperitoneum and lesser and greater curve of the stomach. In addition there are areas of suggest peritoneal carcinomatosis. Recommend further evaluation. No bowel obstruction. Large midline anterior pelvic wall hernia involving small bowel and mesenteric  fat with rectus muscle diastasis. Gallstones. Critical Value/emergent results were called by telephone at the time of interpretation on 09/08/2022 at 5:40 pm to provider [facility] , who verbally acknowledged these results. Electronically Signed   By: Karen Kays M.D.   On: 09/08/2022 20:52     Assessment and plan-   # Left frontal lobe lesion Discussed with neurology.   Etiology is uncertain.  Subacute stroke versus brain metastasis. There was no significant right side muscle weakness. Recommend further imaging workup outpatient, MRI brain 3 to 4 weeks with and without contrast. I will arrange patient to see neuro oncologist outpatient. Patient has atrial fibrillation, at a risk of developing recurrent stroke. From hematology aspect, doing okay with resuming Eliquis, consider 2.5 mg twice daily.  # Iron deficiency anemia, hemoglobin has been better controlled recently.  Will need to be closely monitored outpatient with the restart of Eliquis.  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 Given the new hospitalization, I will defer to next week's treatment for 1 week to allow better recovery.  # Left hand numbness hyperesthesia ?  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 Neurology recommend lamotrigine treatments.  Thank you for allowing me to participate in the care of this patient.   Rickard Patience, MD, PhD Hematology Oncology 11/22/2022

## 2022-11-22 NOTE — TOC Initial Note (Signed)
Transition of Care [facility]) - Initial/Assessment Note    Patient Details  Name: Jeremiah Vance MRN: [id number] Date of Birth: 1970-05-13  Transition of [facility]) CM/SW Contact:    Darolyn Rua, LCSW Phone Number: 11/22/2022, 10:00 AM  Clinical Narrative:                  Patient with high readmission risk score, per chart review assessment recently completed.    Patient lives home with his sister. She provides transportation but does work during the day. Patient says he typically uses [place]. He is aware of Medicaid transportation option. Other transportation resources added to AVS. PCP is [facility]. Pharmacy is [facility] on [address].  Patient has a RW at home and has hx of OPPT at [facility].  Patient denies additional needs at this time.   Expected Discharge Plan: Home/Self Care Barriers to Discharge: Continued Medical Work up   Patient Goals and CMS Choice Patient states their goals for this hospitalization and ongoing recovery are:: to go home CMS Medicare.gov Compare Post Acute Care list provided to:: Patient Choice offered to / list presented to : Patient      Expected Discharge Plan and Services                                              Prior Living Arrangements/Services                       Activities of Daily Living Home Assistive Devices/Equipment: Dan Humphreys (specify type) ADL Screening (condition at time of admission) Patient's cognitive ability adequate to safely complete daily activities?: Yes Is the patient deaf or have difficulty hearing?: No Does the patient have difficulty seeing, even when wearing glasses/contacts?: No Does the patient have difficulty concentrating, remembering, or making decisions?: No Patient able to express need for assistance with ADLs?: Yes Does the patient have difficulty dressing or bathing?: No Independently performs ADLs?: No Communication: Independent Dressing (OT): Needs  assistance Is this a change from baseline?: Pre-admission baseline Grooming: Independent Feeding: Independent Bathing: Independent Toileting: Independent In/Out Bed: Independent Walks in Home: Independent Does the patient have difficulty walking or climbing stairs?: Yes Weakness of Legs: Left Weakness of Arms/Hands: Left  Permission Sought/Granted                  Emotional Assessment              Admission diagnosis:  Stroke [facility]) [I63.9] Cerebrovascular accident (CVA), unspecified mechanism (HCC) [I63.9] Patient Active Problem List   Diagnosis Date Noted   Stroke (HCC) 11/21/2022   Depression with anxiety 11/21/2022   Obesity (BMI 30-39.9) 11/21/2022   Hypomagnesemia 11/21/2022   Mood disorder (HCC) 10/29/2022   Type 2 diabetes mellitus with hyperglycemia, without long-term current use of insulin (HCC) 10/29/2022   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   Encounter for antineoplastic chemotherapy 10/02/2022   Chronic combined systolic and diastolic congestive heart failure (HCC) 09/27/2022   Chronic kidney disease, stage 3b (HCC) 09/27/2022   Type II diabetes mellitus with renal manifestations (HCC) 09/27/2022   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   Goals of care, counseling/discussion 09/19/2022   Anxiety associated with cancer diagnosis (HCC) 09/19/2022   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   IDA (iron deficiency anemia) 09/09/2022   Paroxysmal atrial fibrillation (HCC) 09/08/2022   Dysarthria 05/20/2022  Dyslipidemia 01/16/2022   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    HFrEF (heart failure with reduced ejection fraction) (HCC)    Other cirrhosis of liver (HCC)    History of stroke 10/17/2019   CKD (chronic kidney disease) stage 3, GFR 30-59 ml/min (HCC) 08/31/2019   DM (diabetes mellitus), type 2 with complications (HCC) 07/08/2019   Moderate obstructive sleep apnea 02/11/2018   Chronic fatigue 12/16/2017    Essential hypertension 12/16/2017   History of small bowel obstruction 10/24/2017   AKI (acute kidney injury) (HCC)    Hyperlipidemia    Gout    PCP:  Debera Lat, PA-C Pharmacy:   [id number] [place], [place] - [address] AT [facility] [address] [place] [address] Phone: [phone] Fax: [phone]     Social Determinants of Health (SDOH) Social History: SDOH Screenings   Food Insecurity: No Food Insecurity (11/22/2022)  Housing: Patient Declined (11/22/2022)  Transportation Needs: No Transportation Needs (11/22/2022)  Recent Concern: Transportation Needs - Unmet Transportation Needs (09/27/2022)  Utilities: Not At Risk (11/22/2022)  Alcohol Screen: Low Risk  (11/28/2021)  Depression (PHQ2-9): Medium Risk (10/29/2022)  Financial Resource Strain: High Risk (03/20/2020)  Tobacco Use: Low Risk  (11/22/2022)   SDOH Interventions:     Readmission Risk Interventions    09/29/2022   10:04 AM  Readmission Risk Prevention Plan  Transportation Screening Complete  Medication Review (RN Care Manager) Complete  PCP or Specialist appointment within 3-5 days of discharge Complete  HRI or Home Care Consult Complete  SW Recovery Care/Counseling Consult Complete  Palliative Care Screening Not Applicable  Skilled Nursing Facility Not Applicable

## 2022-11-22 NOTE — Evaluation (Signed)
Occupational Therapy Evaluation Patient Details Name: Jeremiah Vance MRN: [id number] DOB: 07-04-1970 Today's Date: 11/22/2022   History of Present Illness Pt is a 52 year old male presenting to the ED with L sided numbness MRI of the brain with and without contrast showed subacute infarct in the left frontal operculum with petechial hemorrhage and mild surrounding edema. No significant mass effect or midline shift. MRA negative foir LVO. The MRI findings of left frontal operculum does not seem to explain his left-sided numbnes    PMH significant for GE junction carcinoma, HTN, HLD, DM, sCHF with EF 30-35%, stroke with mild left sided weakness, depression with anxiety, A-fib not on anticoagulants, obesity   Clinical Impression   Chart reviewed, pt greeted in bed, alert and oriented x4, increased time for processing required. Pt is agreeable to OT evaluation. PTA pt is MOD I household/short community distances with RW, performs ADL with MOD I, assist for IADLs from sister. At start of session, pt transferring from bsc to bed with assist from nurse. +2 with MOD A via HHA required for step pivot transfer. MOD A +1 required for STS with RW. While seated on edge of bed during assessment of sensation/strength, pt reporting L calf tenderness and it appears red and with edema; RN called in room to assess with pt returning back to bed with MIN A for management of BLE. OT will continue to follow acutely to address functional deficits and to facilitate return to PLOF.      If plan is discharge home, recommend the following: A lot of help with walking and/or transfers;A lot of help with bathing/dressing/bathroom;Assistance with cooking/housework;Direct supervision/assist for medications management;Help with stairs or ramp for entrance;Direct supervision/assist for financial management;Assist for transportation;Supervision due to cognitive status    Functional Status Assessment  Patient has had a recent decline  in their functional status and demonstrates the ability to make significant improvements in function in a reasonable and predictable amount of time.  Equipment Recommendations  BSC/3in1    Recommendations for Other Services       Precautions / Restrictions Precautions Precautions: Fall Restrictions Weight Bearing Restrictions: No      Mobility Bed Mobility Overal bed mobility: Needs Assistance Bed Mobility: Supine to Sit, Sit to Supine       Sit to supine: HOB elevated, Used rails, Mod assist   General bed mobility comments: for BLE management; pt returning to bed from bsc at start of session    Transfers Overall transfer level: Needs assistance Equipment used: Rolling walker (2 wheels) Transfers: Sit to/from Stand Sit to Stand: Mod assist, From elevated surface           General transfer comment: frequent vcs for technique      Balance Overall balance assessment: Needs assistance Sitting-balance support: Feet supported Sitting balance-Leahy Scale: Good     Standing balance support: Bilateral upper extremity supported, During functional activity, Reliant on assistive device for balance Standing balance-Leahy Scale: Poor Standing balance comment: attempts steps up the bed with MOD A +1 with RW, frequent vcs for technique                           ADL either performed or assessed with clinical judgement   ADL Overall ADL's : Needs assistance/impaired     Grooming: Wash/dry face;Sitting;Supervision/safety Grooming Details (indicate cue type and reason): anticipated         Upper Body Dressing : Minimal assistance;Sitting Upper Body Dressing  Details (indicate cue type and reason): anticipated Lower Body Dressing: Maximal assistance Lower Body Dressing Details (indicate cue type and reason): anticipated Toilet Transfer: Moderate assistance;Rolling walker (2 wheels) Toilet Transfer Details (indicate cue type and reason): simulated with RW, pt in  room with nurse returning to bed from bsc requiring MOD A+2 with HHA Toileting- Clothing Manipulation and Hygiene: Minimal assistance;Sitting/lateral lean         General ADL Comments: further mobility limited on this date due to reported pain in L calf, RN in room and pt assisted to supine position     Vision Baseline Vision/History: 1 Wears glasses Patient Visual Report: No change from baseline;Other (comment) (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, reports vision is baseline, unchanged compared to PTA) Additional Comments: will continue to assess     Perception Perception:  (assessment limited as pt needed to return to bed for nursing care, will contniue to assess)       Praxis Praxis:  (appears WFL, however will continue to assess)       Pertinent Vitals/Pain Pain Assessment Pain Assessment: 0-10 Pain Score: 7  Pain Location: generalized, L calf Pain Descriptors / Indicators: Sore Pain Intervention(s): Limited activity within patient's tolerance, Monitored during session, Other (comment) (notified nurse of LLE acute pain, warm to touch, edema; RN in room to assess)     Extremity/Trunk Assessment Upper Extremity Assessment Upper Extremity Assessment: Right hand dominant;LUE deficits/detail LUE Deficits / Details: grossly 3+/5 throughout LUE; RUE 4/5 throughout LUE Sensation: decreased light touch LUE Coordination: decreased fine motor   Lower Extremity Assessment Lower Extremity Assessment: Generalized weakness;LLE deficits/detail LLE Sensation: decreased light touch       Communication Communication Communication: No apparent difficulties Cueing Techniques: Verbal cues;Tactile cues   Cognition Arousal: Alert Behavior During Therapy: Flat affect Overall Cognitive Status: No family/caregiver present to determine baseline cognitive functioning Area of Impairment: Memory, Following commands, Safety/judgement, Awareness, Problem solving                        Following Commands: Follows one step commands with increased time Safety/Judgement: Decreased awareness of deficits Awareness: Emergent Problem Solving: Slow processing, Requires verbal cues, Requires tactile cues       General Comments  HR 110s after transferring to bed from bsc    Exercises Other Exercises Other Exercises: edu re: role of OT, role of acute rehab   Shoulder Instructions      Home Living Family/patient expects to be discharged to:: Private residence Living Arrangements: Other (Comment) (sister) Available Help at Discharge: Family;Available PRN/intermittently (8-10 hrs a day home alone) Type of Home: House Home Access: Other (comment) (flat entrance at the front)     Home Layout: Two level;Bed/bath upstairs;Able to live on main level with bedroom/bathroom Alternate Level Stairs-Number of Steps: flight Alternate Level Stairs-Rails: Right Bathroom Shower/Tub: Walk-in shower;Other (comment) (upstairs, half bathroom on first floor)   Bathroom Toilet: Standard Bathroom Accessibility: Yes How Accessible: Accessible via walker Home Equipment: Shower seat - built Char[employer] fundraiser (2 wheels)          Prior Functioning/Environment Prior Level of Function : Independent/Modified Independent             Mobility Comments: MOD I with RW ADLs Comments: sister assists with IADLs, .does not drive        OT Problem List: Decreased strength;Impaired balance (sitting and/or standing);Decreased cognition;Impaired vision/perception;Decreased safety awareness;Decreased activity tolerance;Decreased knowledge of use of DME or AE;Decreased coordination;Impaired UE functional use;Impaired sensation  OT Treatment/Interventions: Self-care/ADL training;Therapeutic exercise;Patient/family education;Neuromuscular education;Splinting;Balance training;Therapeutic activities;DME and/or AE instruction;Cognitive remediation/compensation    OT Goals(Current goals can be found  in the care plan section) Acute Rehab OT Goals Patient Stated Goal: improve strength OT Goal Formulation: With patient Time For Goal Achievement: 12/13/22 Potential to Achieve Goals: Good  OT Frequency: Min 1X/week    Co-evaluation              AM-PAC OT "6 Clicks" Daily Activity     Outcome Measure Help from another person eating meals?: A Little Help from another person taking care of personal grooming?: A Little Help from another person toileting, which includes using toliet, bedpan, or urinal?: A Lot Help from another person bathing (including washing, rinsing, drying)?: A Lot Help from another person to put on and taking off regular upper body clothing?: A Little Help from another person to put on and taking off regular lower body clothing?: A Lot 6 Click Score: 15   End of Session Equipment Utilized During Treatment: Gait belt;Rolling walker (2 wheels) Nurse Communication: Mobility status  Activity Tolerance: Treatment limited secondary to medical complications (Comment) (pt with R calf pain/tenderness/warm to touch with nurse in room, notifying MD) Patient left: in bed;with call bell/phone within reach;with bed alarm set;with nursing/sitter in room  OT Visit Diagnosis: Unsteadiness on feet (R26.81);Muscle weakness (generalized) (M62.81)                Time: 1610-9604 OT Time Calculation (min): 25 min Charges:  OT General Charges $OT Visit: 1 Visit OT Evaluation $OT Eval Moderate Complexity: 1 Mod  Oleta Mouse, OTD OTR/L  11/22/22, 11:10 AM

## 2022-11-22 NOTE — Progress Notes (Signed)
*  PRELIMINARY RESULTS* Echocardiogram 2D Echocardiogram has been performed.  Jeremiah Vance 11/22/2022, 11:51 AM

## 2022-11-22 NOTE — Evaluation (Signed)
Physical Therapy Evaluation Patient Details Name: MAVRYK KICE MRN: [id number] DOB: [birth year] Today's Date: 11/22/2022  History of Present Illness  Pt is a 52 year old male presenting to the ED with L sided numbness MRI of the brain with and without contrast showed subacute infarct in the left frontal operculum with petechial hemorrhage and mild surrounding edema. No significant mass effect or midline shift. MRA negative foir LVO. The MRI findings of left frontal operculum does not seem to explain his left-sided numbnes    PMH significant for GE junction carcinoma, HTN, HLD, DM, sCHF with EF 30-35%, stroke with mild left sided weakness, depression with anxiety, A-fib not on anticoagulants, obesity  Clinical Impression  Patient resting in bed upon arrival to room; alert and oriented to basic information, follows simple commands, but does require frequent cuing for safety awareness and problem-solving of functional activities.  Endorses L LE pain 8/10 with mobility efforts (notable erythema, edema mid-calf distally; negative DVT); meds requested per RN.  Patient with baseline hemiparesis throughout L hemi-body (strength generally 3-/5 throughout), LE generally guarded in movement due to pain.  Currently requiring min assist for bed mobility; min/mod assist for sit/stand, standing balance and bed/chair transfer with RW.  Requires cuing for hand placement; limited tolerance for L LE WBing, heavy reliance on UE support/RW.  Additional gait/mobility deferred due to pain in L LE; do anticipate consistent min/mod assist +1 for progressive gait efforts as pain allows. Would benefit from skilled PT to address above deficits and promote optimal return to PLOF.; recommend post-acute PT follow up as indicated by interdisciplinary care team.   Would benefit from intensive follow up to maximize return to PLOF as appropriate.       If plan is discharge home, recommend the following: A lot of help with walking  and/or transfers;A lot of help with bathing/dressing/bathroom;Assistance with cooking/housework;Help with stairs or ramp for entrance;Assist for transportation   Can travel by private vehicle        Equipment Recommendations  (reports owning RW)  Recommendations for Other Services       Functional Status Assessment Patient has had a recent decline in their functional status and demonstrates the ability to make significant improvements in function in a reasonable and predictable amount of time.     Precautions / Restrictions Precautions Precautions: Fall Restrictions Weight Bearing Restrictions: No      Mobility  Bed Mobility Overal bed mobility: Needs Assistance Bed Mobility: Supine to Sit     Supine to sit: Min assist Sit to supine: HOB elevated, Used rails, Mod assist   General bed mobility comments: assist for L LE management    Transfers Overall transfer level: Needs assistance Equipment used: Rolling walker (2 wheels) Transfers: Sit to/from Stand, Bed to chair/wheelchair/BSC Sit to Stand: Min assist, Mod assist Stand pivot transfers: Min assist, Mod assist         General transfer comment: cuing for hand placement; limited tolerance for L LE WBing, heavy reliance on UE support/RW    Ambulation/Gait               General Gait Details: unable to tolerate due to pain L LE  Stairs            Wheelchair Mobility     Tilt Bed    Modified Rankin (Stroke Patients Only)       Balance Overall balance assessment: Needs assistance Sitting-balance support: No upper extremity supported, Feet supported Sitting balance-Leahy Scale: Good  Standing balance support: Bilateral upper extremity supported Standing balance-Leahy Scale: Fair                               Pertinent Vitals/Pain Pain Assessment Pain Assessment: 0-10 Pain Score: 8  Pain Location: generalized, L LE/foot Pain Descriptors / Indicators: Sore, Grimacing Pain  Intervention(s): Limited activity within patient's tolerance, Monitored during session, Repositioned, Patient requesting pain meds-RN notified    Home Living Family/patient expects to be discharged to:: Private residence Living Arrangements: Other (Comment) (sister (works outside of the home)) Available Help at Discharge: Family;Available PRN/intermittently Type of Home: House       Alternate Level Stairs-Number of Steps: flight Home Layout: Two level;Bed/bath upstairs;Able to live on main level with bedroom/bathroom Home Equipment: Shower seat - built [employer] fundraiser (2 wheels) Additional Comments: reports can sleep in recliner on 1st floor if needed; sister works outside of the home    Prior Function Prior Level of Function : Independent/Modified Independent             Mobility Comments: MOD I with RW outside of the home, furniture-cruises within the home; denies recent fall history ADLs Comments: sister assists with IADLs, .does not drive     Extremity/Trunk Assessment   Upper Extremity Assessment LUE Deficits / Details: grossly 3+/5 throughout LUE; RUE 4/5 throughout LUE Sensation: decreased light touch LUE Coordination: decreased fine motor    Lower Extremity Assessment Lower Extremity Assessment: Generalized weakness;LLE deficits/detail LLE Deficits / Details: grossly 3-/5 throughout, limited (in part) by pain to L LE LLE Sensation: decreased light touch       Communication   Communication Communication: No apparent difficulties  Cognition Arousal: Alert Behavior During Therapy: WFL for tasks assessed/performed Overall Cognitive Status: No family/caregiver present to determine baseline cognitive functioning Area of Impairment: Memory, Following commands, Safety/judgement, Awareness, Problem solving                       Following Commands: Follows one step commands with increased time Safety/Judgement: Decreased awareness of deficits   Problem  Solving: Slow processing, Requires verbal cues, Requires tactile cues          General Comments      Exercises     Assessment/Plan    PT Assessment Patient needs continued PT services  PT Problem List Decreased strength;Decreased range of motion;Decreased activity tolerance;Decreased balance;Decreased mobility;Decreased knowledge of use of DME;Decreased safety awareness;Decreased knowledge of precautions;Obesity;Decreased skin integrity;Pain       PT Treatment Interventions DME instruction;Gait training;Stair training;Functional mobility training;Therapeutic activities;Therapeutic exercise;Balance training;Patient/family education    PT Goals (Current goals can be found in the Care Plan section)  Acute Rehab PT Goals Patient Stated Goal: to make the pain better PT Goal Formulation: With patient Time For Goal Achievement: 12/06/22 Potential to Achieve Goals: Good    Frequency Min 1X/week     Co-evaluation               AM-PAC PT "6 Clicks" Mobility  Outcome Measure Help needed turning from your back to your side while in a flat bed without using bedrails?: None Help needed moving from lying on your back to sitting on the side of a flat bed without using bedrails?: A Little Help needed moving to and from a bed to a chair (including a wheelchair)?: A Lot Help needed standing up from a chair using your arms (e.g., wheelchair or bedside chair)?: A Lot  Help needed to walk in hospital room?: A Lot Help needed climbing 3-5 steps with a railing? : A Lot 6 Click Score: 15    End of Session Equipment Utilized During Treatment: Gait belt Activity Tolerance: Patient tolerated treatment well Patient left: in chair;with call bell/phone within reach;with chair alarm set Nurse Communication: Mobility status PT Visit Diagnosis: Muscle weakness (generalized) (M62.81);Difficulty in walking, not elsewhere classified (R26.2);Pain Pain - Right/Left: Left Pain - part of body: Leg     Time: 8119-1478 PT Time Calculation (min) (ACUTE ONLY): 23 min   Charges:   PT Evaluation $PT Eval Moderate Complexity: 1 Mod   PT General Charges $$ ACUTE PT VISIT: 1 Visit         Jacinto Keil H. Manson Passey, PT, DPT, NCS 11/22/22, 4:12 PM [phone]

## 2022-11-22 NOTE — Telephone Encounter (Signed)
Pt sister called back regarding the appts changes and asked to have appts on Tues and Thursdays due to her work schedule  She stated that we can KEEP the appts on 9/4 as is, and then the following ones to put back on tues/thurs.    Thank you

## 2022-11-23 DIAGNOSIS — I639 Cerebral infarction, unspecified: Secondary | ICD-10-CM | POA: Diagnosis not present

## 2022-11-23 LAB — BASIC METABOLIC PANEL
Anion gap: 9 (ref 5–15)
BUN: 26 mg/dL — ABNORMAL HIGH (ref 6–20)
CO2: 26 mmol/L (ref 22–32)
Calcium: 8.2 mg/dL — ABNORMAL LOW (ref 8.9–10.3)
Chloride: 101 mmol/L (ref 98–111)
Creatinine, Ser: 1.29 mg/dL — ABNORMAL HIGH (ref 0.61–1.24)
GFR, Estimated: >60 mL/min (ref >60)
Glucose, Bld: 113 mg/dL — ABNORMAL HIGH (ref 70–99)
Potassium: 3.8 mmol/L (ref 3.5–5.1)
Sodium: 136 mmol/L (ref 135–145)

## 2022-11-23 LAB — CBC WITH DIFFERENTIAL/PLATELET
Abs Immature Granulocytes: 0.04 10*3/uL (ref 0.00–0.07)
Basophils Absolute: 0 10*3/uL (ref 0.0–0.1)
Basophils Relative: 1 %
Eosinophils Absolute: 0.2 10*3/uL (ref 0.0–0.5)
Eosinophils Relative: 3 %
HCT: 29 % — ABNORMAL LOW (ref 39.0–52.0)
Hemoglobin: 9.3 g/dL — ABNORMAL LOW (ref 13.0–17.0)
Immature Granulocytes: 1 %
Lymphocytes Relative: 16 %
Lymphs Abs: 1 10*3/uL (ref 0.7–4.0)
MCH: 26.7 pg (ref 26.0–34.0)
MCHC: 32.1 g/dL (ref 30.0–36.0)
MCV: 83.3 fL (ref 80.0–100.0)
Monocytes Absolute: 1 10*3/uL (ref 0.1–1.0)
Monocytes Relative: 16 %
Neutro Abs: 4.1 10*3/uL (ref 1.7–7.7)
Neutrophils Relative %: 63 %
Platelets: 215 10*3/uL (ref 150–400)
RBC: 3.48 MIL/uL — ABNORMAL LOW (ref 4.22–5.81)
RDW: 19.2 % — ABNORMAL HIGH (ref 11.5–15.5)
WBC: 6.4 10*3/uL (ref 4.0–10.5)
nRBC: 0 % (ref 0.0–0.2)

## 2022-11-23 LAB — GLUCOSE, CAPILLARY
Glucose-Capillary: 168 mg/dL — ABNORMAL HIGH (ref 70–99)
Glucose-Capillary: 150 mg/dL — ABNORMAL HIGH (ref 70–99)
Glucose-Capillary: 119 mg/dL — ABNORMAL HIGH (ref 70–99)
Glucose-Capillary: 132 mg/dL — ABNORMAL HIGH (ref 70–99)

## 2022-11-23 NOTE — TOC Progression Note (Signed)
Transition of Care [facility]) - Progression Note    Patient Details  Name: Jeremiah Vance MRN: [id number] Date of Birth: September 12, 1970  Transition of [facility]) CM/SW Contact  Bing Quarry, RN Phone Number: 11/23/2022, 10:39 AM  Clinical Narrative:  8/24: CIR consulted and final approval pending.  Gabriel Cirri MSN RN CM  Transitions of Care Department [facility] [phone] Weekends Only     Expected Discharge Plan: Home/Self Care Barriers to Discharge: Continued Medical Work up  Expected Discharge Plan and Services                                               Social Determinants of Health (SDOH) Interventions SDOH Screenings   Food Insecurity: No Food Insecurity (11/22/2022)  Housing: Patient Declined (11/22/2022)  Transportation Needs: No Transportation Needs (11/22/2022)  Recent Concern: Transportation Needs - Unmet Transportation Needs (09/27/2022)  Utilities: Not At Risk (11/22/2022)  Alcohol Screen: Low Risk  (11/28/2021)  Depression (PHQ2-9): Medium Risk (10/29/2022)  Financial Resource Strain: High Risk (03/20/2020)  Tobacco Use: Low Risk  (11/22/2022)    Readmission Risk Interventions    09/29/2022   10:04 AM  Readmission Risk Prevention Plan  Transportation Screening Complete  Medication Review (RN Care Manager) Complete  PCP or Specialist appointment within 3-5 days of discharge Complete  HRI or Home Care Consult Complete  SW Recovery Care/Counseling Consult Complete  Palliative Care Screening Not Applicable  Skilled Nursing Facility Not Applicable

## 2022-11-23 NOTE — Plan of Care (Signed)

## 2022-11-23 NOTE — Progress Notes (Signed)
Physical Therapy Treatment Patient Details Name: Jeremiah Vance MRN: [id number] DOB: [birth year] Today's Date: 11/23/2022   History of Present Illness Pt is a 52 year old male presenting to the ED with L sided numbness MRI of the brain with and without contrast showed subacute infarct in the left frontal operculum with petechial hemorrhage and mild surrounding edema. No significant mass effect or midline shift. MRA negative foir LVO. The MRI findings of left frontal operculum does not seem to explain his left-sided numbnes    PMH significant for GE junction carcinoma, HTN, HLD, DM, sCHF with EF 30-35%, stroke with mild left sided weakness, depression with anxiety, A-fib not on anticoagulants, obesity    PT Comments  Pt is very motivated to participate, still complains of pain with B LEs limiting participation. Reports his pain is 8/10 despite repositioning/meds/pillows. Is agreeable to attempt to transfer to recliner and does need assist for transfer, currently not at baseline level. Poor vision noted with increased assistance and cues required for room set up. Will continue to progress as able to achieve functional independence.    If plan is discharge home, recommend the following: A lot of help with walking and/or transfers;A lot of help with bathing/dressing/bathroom;Assistance with cooking/housework;Help with stairs or ramp for entrance;Assist for transportation   Can travel by private vehicle        Equipment Recommendations       Recommendations for Other Services       Precautions / Restrictions Precautions Precautions: Fall Restrictions Weight Bearing Restrictions: No     Mobility  Bed Mobility Overal bed mobility: Needs Assistance Bed Mobility: Supine to Sit     Supine to sit: Contact guard     General bed mobility comments: improved technique, still needs cues for sequencing and hand rails for assist. No physical assist from PT    Transfers Overall transfer level:  Needs assistance Equipment used: Rolling walker (2 wheels) Transfers: Sit to/from Stand, Bed to chair/wheelchair/BSC Sit to Stand: Min assist Stand pivot transfers: Min assist         General transfer comment: Pt motivated to attempt transfers indep however pt unable to get forward momentum and power to achieve buttock lift. Min assist required for transfer to stand and assist for guidance of RW and pivoting to recliner. Frequent cues for safety    Ambulation/Gait               General Gait Details: unable to tolerate due to severe pain in B LEs   Stairs             Wheelchair Mobility     Tilt Bed    Modified Rankin (Stroke Patients Only)       Balance Overall balance assessment: Needs assistance Sitting-balance support: No upper extremity supported, Feet supported Sitting balance-Leahy Scale: Good     Standing balance support: Bilateral upper extremity supported Standing balance-Leahy Scale: Fair                              Cognition Arousal: Alert Behavior During Therapy: WFL for tasks assessed/performed Overall Cognitive Status: Within Functional Limits for tasks assessed                                 General Comments: flat affect, however able to answer all questions accurately        Exercises  General Comments        Pertinent Vitals/Pain Pain Assessment Pain Assessment: 0-10 Pain Score: 8  Pain Location: generalized, B LEs Pain Descriptors / Indicators: Sore, Grimacing Pain Intervention(s): Limited activity within patient's tolerance, Repositioned    Home Living   Living Arrangements: Other relatives (sister) Available Help at Discharge: Family;Available 24 hours/day Type of Home: Other(Comment) (townhouse) Home Access: Level entry     Alternate Level Stairs-Number of Steps: flight Home Layout: 1/2 bath on main level;Two level        Prior Function            PT Goals (current goals  can now be found in the care plan section) Acute Rehab PT Goals Patient Stated Goal: to make the pain better PT Goal Formulation: With patient Time For Goal Achievement: 12/06/22 Potential to Achieve Goals: Good Progress towards PT goals: Progressing toward goals    Frequency    Min 1X/week      PT Plan      Co-evaluation              AM-PAC PT "6 Clicks" Mobility   Outcome Measure  Help needed turning from your back to your side while in a flat bed without using bedrails?: None Help needed moving from lying on your back to sitting on the side of a flat bed without using bedrails?: A Little Help needed moving to and from a bed to a chair (including a wheelchair)?: A Lot Help needed standing up from a chair using your arms (e.g., wheelchair or bedside chair)?: A Lot Help needed to walk in hospital room?: A Lot Help needed climbing 3-5 steps with a railing? : A Lot 6 Click Score: 15    End of Session Equipment Utilized During Treatment: Gait belt Activity Tolerance: Patient limited by pain Patient left: in chair;with chair alarm set Nurse Communication: Mobility status PT Visit Diagnosis: Muscle weakness (generalized) (M62.81);Difficulty in walking, not elsewhere classified (R26.2);Pain Pain - Right/Left: Left Pain - part of body: Leg     Time: 1001-1015 PT Time Calculation (min) (ACUTE ONLY): 14 min  Charges:    $Therapeutic Activity: 8-22 mins PT General Charges $$ ACUTE PT VISIT: 1 Visit                     Elizabeth Palau, PT, DPT, [facility] [phone]    Rebel Willcutt 11/23/2022, 1:33 PM

## 2022-11-23 NOTE — Progress Notes (Signed)
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 No significant mass effect or midline shift. MRA negative foir LVO. The MRI findings of left frontal operculum does not seem to explain his left-sided numbnes    PMH significant for GE junction carcinoma, HTN, HLD, DM, sCHF with EF 30-35%, stroke with mild left sided weakness, depression with anxiety, A-fib not on anticoagulants, obesity   OT comments  Chart reviewed to date, spoke with nurse who reports pt declined pain meds prior to mobility, pt also reports he declined pain meds. Pt is alert and oriented x4 however appears impulsive during tx session requiring multi modal cues for safety/technique. Tx session targeted improving functional activity tolerance via task oriented training/ADL training. Improvements noted in mobility with pt amb with RW with 6' with MIN A, UB dressing with SET UP. Pt continues to perform below PLOF, would benefit from additional OT to address deficits.       If plan is discharge home, recommend the following:  A lot of help with walking and/or transfers;A lot of help with bathing/dressing/bathroom;Assistance with cooking/housework;Direct supervision/assist for medications management;Help with stairs or ramp for entrance;Direct supervision/assist for financial management;Assist for transportation;Supervision due to cognitive status   Equipment Recommendations  BSC/3in1    Recommendations for Other Services      Precautions / Restrictions Precautions Precautions: Fall Restrictions Weight Bearing Restrictions: No       Mobility Bed Mobility               General bed mobility comments:  NT in recliner pre/post session    Transfers Overall transfer level: Needs assistance Equipment used: Rolling walker (2 wheels) Transfers: Sit to/from Stand Sit to Stand: Min assist           General transfer comment: frequent multi modal cues for RW techinque     Balance Overall balance assessment: Needs assistance Sitting-balance support: No upper extremity supported, Feet supported Sitting balance-Leahy Scale: Good     Standing balance support: Bilateral upper extremity supported Standing balance-Leahy Scale: Fair                             ADL either performed or assessed with clinical judgement   ADL Overall ADL's : Needs assistance/impaired Eating/Feeding: Minimal assistance;Sitting Eating/Feeding Details (indicate cue type and reason): open package/containers, optimal positioning of tray due to visual defciits Grooming: Wash/dry face;Sitting;Supervision/safety           Upper Body Dressing : Set up;Sitting Upper Body Dressing Details (indicate cue type and reason): doff/down gown Lower Body Dressing: Maximal assistance Lower Body Dressing Details (indicate cue type and reason): doff socks Toilet Transfer: Minimal assistance;Rolling walker (2 wheels);Ambulation Toilet Transfer Details (indicate cue type and reason): simulated with RW in room         Functional mobility during ADLs: Minimal assistance;Rolling walker (2 wheels) (approx 6' in room with RW, frequent vcs for technique/safety)      Extremity/Trunk Assessment              Vision       Perception     Praxis      Cognition Arousal: Alert Behavior During  Therapy: Impulsive Overall Cognitive Status: No family/caregiver present to determine baseline cognitive functioning Area of Impairment: Following commands, Safety/judgement, Awareness, Problem solving, Attention                   Current Attention Level: Sustained   Following Commands: Follows one step commands  with increased time, Follows multi-step commands inconsistently Safety/Judgement: Decreased awareness of deficits Awareness: Emergent Problem Solving: Slow processing, Requires verbal cues, Requires tactile cues General Comments: pt with ?higher level cognitive deficits, will continue to assess        Exercises      Shoulder Instructions       General Comments vss througout, continued edema noted in LLE, calf down to foot; RN notified, socks removed    Pertinent Vitals/ Pain       Pain Assessment Pain Assessment: 0-10 Pain Score: 6  Pain Location: generalized Pain Descriptors / Indicators: Sore, Grimacing Pain Intervention(s): Monitored during session (pt reports he declined pain meds when offered)  Home Living                                          Prior Functioning/Environment              Frequency  Min 1X/week        Progress Toward Goals  OT Goals(current goals can now be found in the care plan section)  Progress towards OT goals: Progressing toward goals     Plan      Co-evaluation                 AM-PAC OT "6 Clicks" Daily Activity     Outcome Measure   Help from another person eating meals?: A Little Help from another person taking care of personal grooming?: A Little Help from another person toileting, which includes using toliet, bedpan, or urinal?: A Lot Help from another person bathing (including washing, rinsing, drying)?: A Lot Help from another person to put on and taking off regular upper body clothing?: None Help from another person to put on and taking off regular lower body clothing?: A Lot 6 Click Score: 16    End of Session Equipment Utilized During Treatment: Rolling walker (2 wheels)  OT Visit Diagnosis: Unsteadiness on feet (R26.81);Muscle weakness (generalized) (M62.81)   Activity Tolerance Patient tolerated treatment well   Patient Left in chair;with call bell/phone within reach;with chair alarm  set   Nurse Communication Mobility status        Time: 4403-4742 OT Time Calculation (min): 15 min  Charges: OT General Charges $OT Visit: 1 Visit OT Treatments $Self Care/Home Management : 8-22 mins Oleta Mouse, OTD OTR/L  11/23/22, 3:34 PM

## 2022-11-23 NOTE — PMR Pre-admission (Signed)
PMR Admission Coordinator Pre-Admission Assessment  Patient: Jeremiah Vance is an 52 y.o., male MRN: [id number] DOB: [birth year] Height: 5\' 10"  (177.8 cm) Weight: 111 kg  Insurance Information HMO: ***    PPO: ***     PCP:      IPA:      80/20:      OTHER:  PRIMARY: [place] Medicaid [employer] of       Policy#: [id number]      Subscriber: patient CM Name: ***      Phone#: ***     Fax#: *** Pre-Cert#: ***      Employer: *** Benefits:  Phone #: ***     Name: *** Dolores Hoose. Date: ***     Deduct: ***      Out of Pocket Max: ***      Life Max: *** CIR: ***      SNF: *** Outpatient: ***     Co-Pay: *** Home Health: ***      Co-Pay: *** DME: ***     Co-Pay: *** Providers: in-network SECONDARY:       Policy#:      Phone#:   Financial Counselor:       Phone#:   The Engineer, materials Information Summary" for patients in Inpatient Rehabilitation Facilities with attached "Privacy Act Statement-Health Care Records" was provided and verbally reviewed with: N/A  Emergency Contact Information Contact Information     Name Relation Home Work Mobile   Sleeth,Stephanie Sister [phone]      Other Contacts   None on File     Current Medical History  Patient Admitting Diagnosis: CVA History of Present Illness: Pt is a 52 year old male with medical hx significant for: 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 Pt presented to [facility] on 11/21/22 d/t numbness to left arm and leg. CT head and MRI showed subacute left frontal operculum infarct with petechial hemorrhage and mild surrounding edema. MRA was negative for LVO. Oncology consulted and recommended outpatient imaging workup. Therapy evaluations completed and CIR recommended d/t pt's deficits in functional mobility. Complete NIHSS TOTAL: 1  Patient's medical record from [facility] has been reviewed by the rehabilitation  admission coordinator and physician.  Past Medical History  Past Medical History:  Diagnosis Date   Acute ischemic stroke (HCC) 09/06/2017   Acute renal failure superimposed on stage 3a chronic kidney disease (HCC) 07/08/2019   Acute right-sided weakness    Allergies    Anasarca    Anemia 07/10/2017   Arthritis    Cancer (HCC)    stomach and esophageal cancer stage 4   CHF (congestive heart failure) (HCC)    "coinsided w/kidney problems I was having 06/2017"   Chicken pox    CKD (chronic kidney disease) stage 3, GFR 30-59 ml/min (HCC) 06/2017   Depression    Elevated troponin 07/08/2019   High cholesterol    History of cardiomyopathy    LVEF 40 to 45% in April 2019 - subsequently normalized   Hyperbilirubinemia 07/10/2017   Hypertension    Ischemic stroke (HCC)    Small left internal capsule infarct due to lacunar disease   Morbid obesity (HCC)    Normocytic anemia 12/16/2017   Recurrent incisional hernia with incarceration s/p repair 10/22/2017 10/21/2017   Stroke (HCC) 08/2017   "right sided weakness since; getting stronger though" (10/22/2017)   Type 2 diabetes mellitus (HCC)     Has the patient  had major surgery during 100 days prior to admission? Yes  Family History   family history includes Arthritis in his father, mother, paternal grandfather, and paternal grandmother; Depression in his maternal grandmother, mother, and sister; Diabetes in his father, maternal grandmother, mother, sister, and sister; Hearing loss in his father and paternal grandmother; Heart attack in his father and paternal grandfather; Heart disease in his father, maternal grandmother, mother, and paternal grandfather; Hyperlipidemia in his father, maternal grandfather, and maternal grandmother; Hypertension in his father, maternal grandfather, maternal grandmother, mother, and sister; Learning disabilities in his mother; Melanoma in an other family member; Mental illness in his mother and sister; Sleep  apnea in his mother and sister; Stroke in his father, maternal grandfather, maternal grandmother, mother, and paternal grandfather.  Current Medications  Current Facility-Administered Medications:    acetaminophen (TYLENOL) tablet 650 mg, 650 mg, Oral, Q4H PRN **OR** acetaminophen (TYLENOL) 160 MG/5ML solution 650 mg, 650 mg, Per Tube, Q4H PRN **OR** acetaminophen (TYLENOL) suppository 650 mg, 650 mg, Rectal, Q4H PRN, Lorretta Harp, MD   apixaban Everlene Balls) tablet 2.5 mg, 2.5 mg, Oral, BID, Sreenath, Sudheer B, MD, 2.5 mg at 11/23/22 0940   atorvastatin (LIPITOR) tablet 40 mg, 40 mg, Oral, q1800, Lorretta Harp, MD, 40 mg at 11/22/22 1743   busPIRone (BUSPAR) tablet 7.5 mg, 7.5 mg, Oral, BID, Lorretta Harp, MD, 7.5 mg at 11/23/22 0940   gabapentin (NEURONTIN) capsule 100 mg, 100 mg, Oral, BID, Lorretta Harp, MD, 100 mg at 11/23/22 0940   hydrALAZINE (APRESOLINE) injection 5 mg, 5 mg, Intravenous, Q2H PRN, Lorretta Harp, MD   insulin aspart (novoLOG) injection 0-5 Units, 0-5 Units, Subcutaneous, QHS, Lorretta Harp, MD   insulin aspart (novoLOG) injection 0-9 Units, 0-9 Units, Subcutaneous, TID WC, Lorretta Harp, MD, 2 Units at 11/23/22 0900   lamoTRIgine (LAMICTAL) tablet 25 mg, 25 mg, Oral, QHS, Bhagat, Srishti L, MD, 25 mg at 11/22/22 2158   ondansetron (ZOFRAN) injection 4 mg, 4 mg, Intravenous, Q8H PRN, Lorretta Harp, MD   oxyCODONE (Oxy IR/ROXICODONE) immediate release tablet 5-10 mg, 5-10 mg, Oral, Q4H PRN, Georgeann Oppenheim, Sudheer B, MD, 10 mg at 11/23/22 0752   pantoprazole (PROTONIX) EC tablet 40 mg, 40 mg, Oral, BID AC, Lorretta Harp, MD, 40 mg at 11/23/22 9562   senna-docusate (Senokot-S) tablet 1 tablet, 1 tablet, Oral, QHS PRN, Lorretta Harp, MD   sertraline (ZOLOFT) tablet 50 mg, 50 mg, Oral, Daily, Lorretta Harp, MD, 50 mg at 11/23/22 0940   torsemide (DEMADEX) tablet 40 mg, 40 mg, Oral, Daily PRN, Lorretta Harp, MD  Patients Current Diet:  Diet Order             Diet Heart Fluid consistency: Thin  Diet effective now                    Precautions / Restrictions Precautions Precautions: Fall Restrictions Weight Bearing Restrictions: No   Has the patient had 2 or more falls or a fall with injury in the past year? No  Prior Activity Level Limited Community (1-2x/wk): gets out of house 1-2x/week  Prior Functional Level Self Care: Did the patient need help bathing, dressing, using the toilet or eating? Independent  Indoor Mobility: Did the patient need assistance with walking from room to room (with or without device)? Independent  Stairs: Did the patient need assistance with internal or external stairs (with or without device)? Independent  Functional Cognition: Did the patient need help planning regular tasks such as shopping or remembering to take medications?  Needed some help  Patient Information Are you of Hispanic, Latino/a,or Spanish origin?: A. No, not of Hispanic, Latino/a, or Spanish origin What is your race?: A. White Do you need or want an interpreter to communicate with a doctor or health care staff?: 0. No  Patient's Response To:  Health Literacy and Transportation Is the patient able to respond to health literacy and transportation needs?: Yes Health Literacy - How often do you need to have someone help you when you read instructions, pamphlets, or other written material from your doctor or pharmacy?: Always (sister reads it to pt) In the past 12 months, has lack of transportation kept you from medical appointments or from getting medications?: No In the past 12 months, has lack of transportation kept you from meetings, work, or from getting things needed for daily living?: No  Home Assistive Devices / Equipment Home Assistive Devices/Equipment: Environmental consultant (specify type) Home Equipment: Shower seat - built in, [employer] (2 wheels)  Prior Device Use: Indicate devices/aids used by the patient prior to current illness, exacerbation or injury? Walker  Current Functional  Level Cognition  Arousal/Alertness: Awake/alert Overall Cognitive Status: No family/caregiver present to determine baseline cognitive functioning Orientation Level: Oriented X4 Following Commands: Follows one step commands with increased time Safety/Judgement: Decreased awareness of deficits    Extremity Assessment (includes Sensation/Coordination)  Upper Extremity Assessment: Right hand dominant, LUE deficits/detail LUE Deficits / Details: grossly 3+/5 throughout LUE; RUE 4/5 throughout LUE Sensation: decreased light touch LUE Coordination: decreased fine motor  Lower Extremity Assessment: Generalized weakness, LLE deficits/detail LLE Deficits / Details: grossly 3-/5 throughout, limited (in part) by pain to L LE LLE Sensation: decreased light touch    ADLs  Overall ADL's : Needs assistance/impaired Grooming: Wash/dry face, Sitting, Supervision/safety Grooming Details (indicate cue type and reason): anticipated Upper Body Dressing : Minimal assistance, Sitting Upper Body Dressing Details (indicate cue type and reason): anticipated Lower Body Dressing: Maximal assistance Lower Body Dressing Details (indicate cue type and reason): anticipated Toilet Transfer: Moderate assistance, Rolling walker (2 wheels) Toilet Transfer Details (indicate cue type and reason): simulated with RW, pt in room with nurse returning to bed from bsc requiring MOD A+2 with HHA Toileting- Clothing Manipulation and Hygiene: Minimal assistance, Sitting/lateral lean General ADL Comments: further mobility limited on this date due to reported pain in L calf, RN in room and pt assisted to supine position    Mobility  Overal bed mobility: Needs Assistance Bed Mobility: Supine to Sit Supine to sit: Min assist Sit to supine: HOB elevated, Used rails, Mod assist General bed mobility comments: assist for L LE management    Transfers  Overall transfer level: Needs assistance Equipment used: Rolling walker (2  wheels) Transfers: Sit to/from Stand, Bed to chair/wheelchair/BSC Sit to Stand: Min assist, Mod assist Bed to/from chair/wheelchair/BSC transfer type:: Stand pivot Stand pivot transfers: Min assist, Mod assist General transfer comment: cuing for hand placement; limited tolerance for L LE WBing, heavy reliance on UE support/RW    Ambulation / Gait / Stairs / Wheelchair Mobility  Ambulation/Gait General Gait Details: unable to tolerate due to pain L LE    Posture / Balance Balance Overall balance assessment: Needs assistance Sitting-balance support: No upper extremity supported, Feet supported Sitting balance-Leahy Scale: Good Standing balance support: Bilateral upper extremity supported Standing balance-Leahy Scale: Fair Standing balance comment: attempts steps up the bed with MOD A +1 with RW, frequent vcs for technique    Special needs/care consideration Skin Erythema/Redness: leg/bilateral and Diabetic management  Novolog 0-9 units 3x daily at meals; Novolog 0-5 units daily at bedtime   Previous Home Environment (from acute therapy documentation) Living Arrangements: Other relatives (sister)  Lives With: Other (Comment) (sister) Available Help at Discharge: Family, Available 24 hours/day Type of Home: Other(Comment) (townhouse) Home Layout: 1/2 bath on main level, Two level Alternate Level Stairs-Rails: Right Alternate Level Stairs-Number of Steps: flight Home Access: Level entry Bathroom Shower/Tub: Health visitor: Standard Bathroom Accessibility: Yes How Accessible: Accessible via walker Home Care Services: No Additional Comments: reports can sleep in recliner on 1st floor if needed; sister works outside of the home  Discharge Living Setting Plans for Discharge Living Setting: Patient's home Type of Home at Discharge: Other (Comment) (townhouse) Discharge Home Layout: 1/2 bath on main level, Two level Alternate Level Stairs-Rails: Right Alternate Level  Stairs-Number of Steps: flight Discharge Home Access: Level entry Discharge Bathroom Shower/Tub: Walk-in shower Discharge Bathroom Toilet: Standard Discharge Bathroom Accessibility: Yes How Accessible: Accessible via walker Does the patient have any problems obtaining your medications?: No  Social/Family/Support Systems Anticipated Caregiver: Jeremiah Vance (sister) and other family Anticipated Caregiver's Contact Information: [phone] Caregiver Availability: 24/7 Discharge Plan Discussed with Primary Caregiver: Yes Is Caregiver In Agreement with Plan?: Yes Does Caregiver/Family have Issues with Lodging/Transportation while Pt is in Rehab?: No  Goals Patient/Family Goal for Rehab: *** Expected length of stay: *** Pt/Family Agrees to Admission and willing to participate: Yes Program Orientation Provided & Reviewed with Pt/Caregiver Including Roles  & Responsibilities: Yes  Decrease burden of Care through IP rehab admission: NA  Possible need for SNF placement upon discharge: Not anticipated  Patient Condition: I have reviewed medical records from [facility], spoken with CM, and patient and family member. I discussed via phone for inpatient rehabilitation assessment.  Patient will benefit from ongoing PT and OT, can actively participate in 3 hours of therapy a day 5 days of the week, and can make measurable gains during the admission.  Patient will also benefit from the coordinated team approach during an Inpatient Acute Rehabilitation admission.  The patient will receive intensive therapy as well as Rehabilitation physician, nursing, social worker, and care management interventions.  Due to safety, skin/wound care, disease management, medication administration, pain management, and patient education the patient requires 24 hour a day rehabilitation nursing.  The patient is currently *** with mobility and basic ADLs.  Discharge setting and therapy post discharge at  home with home health is anticipated.  Patient has agreed to participate in the Acute Inpatient Rehabilitation Program and will admit {Time; today/tomorrow:10263}.  Preadmission Screen Completed By:  Domingo Pulse, 11/23/2022 11:30 AM ______________________________________________________________________   Discussed status with Dr. [name] Kitchen on *** at *** and received approval for admission today.  Admission Coordinator:  Domingo Pulse, CCC-SLP, time ***/Date ***   Assessment/Plan: Diagnosis: Does the need for close, 24 hr/day Medical supervision in concert with the patient's rehab needs make it unreasonable for this patient to be served in a less intensive setting? {yes_no_potentially:3041433} Co-Morbidities requiring supervision/potential complications: *** Due to {due BJ:4782956}, does the patient require 24 hr/day rehab nursing? {yes_no_potentially:3041433} Does the patient require coordinated care of a physician, rehab nurse, PT, OT, and SLP to address physical and functional deficits in the context of the above medical diagnosis(es)? {yes_no_potentially:3041433} Addressing deficits in the following areas: {deficits:3041436} Can the patient actively participate in an intensive therapy program of at least 3 hrs of therapy 5 days a week? {yes_no_potentially:3041433} The potential for patient  to make measurable gains while on inpatient rehab is {potential:3041437} Anticipated functional outcomes upon discharge from inpatient rehab: {functional outcomes:304600100} PT, {functional outcomes:304600100} OT, {functional outcomes:304600100} SLP Estimated rehab length of stay to reach the above functional goals is: *** Anticipated discharge destination: {anticipated dc setting:21604} 10. Overall Rehab/Functional Prognosis: {potential:3041437}   MD Signature: ***

## 2022-11-23 NOTE — Progress Notes (Signed)
Inpatient Rehab Admissions:  Inpatient Rehab Consult received.  I spoke with patient on the telephone for rehabilitation assessment and to discuss goals and expectations of an inpatient rehab admission.  Discussed average length of stay, insurance authorization requirement, discharge home after completion of CIR. Pt acknowledged understanding. Pt would like to pursue CIR. Pt gave permission to contact sister [name]. Spoke with Lyons on the phone. She also acknowledged understanding of CIR goals and expectations. She is supportive of pt pursuing CIR. She confirmed she and other family members will be able to provide support for pt after discharge. Will continue to follow.  Signed: Wolfgang Phoenix, MS, CCC-SLP Admissions Coordinator [phone]

## 2022-11-23 NOTE — Progress Notes (Signed)
PROGRESS NOTE    Jeremiah Vance  BMW:[id number] DOB: May 07, 1970 DOA: 11/21/2022 PCP: Debera Lat, PA-C    Brief Narrative:  52 y.o. male with medical history significant of GE junction carcinoma, HTN, HLD, DM, sCHF with EF 30-35%, stroke with mild left sided weakness, depression with anxiety, A-fib not on anticoagulants, obesity, who presents with left-sided numbness.   Patient states that he has history of stroke with mild left-sided weakness.  At about 9:30, he started feeling left-sided numbness.  His weakness in right arm and leg has not changed.  No vision change or hearing loss.  Patient denies chest pain, cough, SOB.  No fever or chills.  Denies symptoms of UTI.  No nausea, vomiting, diarrhea or abdominal pain.  Denies rectal bleeding or dark stool.   Assessment & Plan:   Principal Problem:   Stroke [facility]) Active Problems:   Paroxysmal atrial fibrillation (HCC)   GE junction carcinoma (HCC)   Hyperlipidemia   Chronic combined systolic and diastolic congestive heart failure (HCC)   Essential hypertension   Chronic kidney disease, stage 3b (HCC)   Hypomagnesemia   Type II diabetes mellitus with renal manifestations (HCC)   Depression with anxiety   Obesity (BMI 30-39.9)   Acute CVA (cerebrovascular accident) (HCC)  Stroke [facility]): pt has hx of stroke, now has left-sided numbness.  MRI of the brain with and without contrast showed subacute infarct in the left frontal operculum with petechial hemorrhage and mild surrounding edema. No significant mass effect or midline shift. MRA negative foir LVO. The MRI findings of left frontal operculum does not seem to explain his left-sided numbness.  Consulted Dr. Iver Nestle of neuro. Plan: After discussion with neurology and oncology decision made to start therapeutic anticoagulation.  Eliquis 2.5 mg twice daily ordered.  Start date 8/23.  Monitor for bleeding.  Okay for diet.  Medically stable for discharge to CIR.  Workup in progress.    Paroxysmal atrial fibrillation (HCC) Started on Eliquis 2.5 mg twice daily.  Heart rate controlled.  Can discontinue telemetry   GE junction carcinoma (HCC)  Stage IV.  Chemotherapy, last dose was on Tuesday.   Patient is following up with Dr. Cathie Hoops -Oncology consult appreciated.  Chemotherapy currently on hold   Hyperlipidemia -Lipitor   Chronic combined systolic and diastolic congestive heart failure (HCC)  2D echo on 11/23/2021 showed EF 30-35% with grade 1 diastolic dysfunction.  Patient does not have shortness breath, but he has elevated BNP 239.9 and leg edema, patient is at risk of developing CHF exacerbation. -Continue torsemide   Essential hypertension -Torsemide -IV hydralazine as needed   Chronic kidney disease, stage 3b (HCC): Slightly worsening than baseline.  Recent baseline creatinine 1.0-1.4.  His creatinine is 1.51, BUN 41, GFR 55. -Monitor renal function closely   Hypomagnesemia:  -Repleted magnesium   Type II diabetes mellitus with renal manifestations [facility]): Recent A1c 5.9, well-controlled.  Patient is taking farxiga at home -Sliding scale insulin   Depression with anxiety -Continue home medications   Obesity (BMI 30-39.9): Body weight 111.9 kg, BMI 35.39 -Encourage losing weight -Exercise and healthy diet  DVT prophylaxis: SCD Code Status: Full Family Communication: None Disposition Plan: Status is: Inpatient Remains inpatient appropriate because: Subacute CVA   Level of care: Telemetry Medical  Consultants:  Neurology Oncology  Procedures:  None  Antimicrobials: None   Subjective: Seen and examined.  Resting comfortably in bed.  Eating breakfast without issue.  No apparent distress.  And continues to endorse numbness on the  left upper and lower extremity  Objective: Vitals:   11/22/22 2010 11/22/22 2358 11/23/22 0330 11/23/22 0939  BP: (!) 149/91 (!) 153/94 (!) 153/87 137/87  Pulse: 81 89 87 98  Resp:  18 18 18   Temp: (!) 97.5 F  (36.4 C) 98.1 F (36.7 C) 98.9 F (37.2 C) 98.2 F (36.8 C)  TempSrc: Oral Oral    SpO2: 96% 98% 94% 96%  Weight:      Height:        Intake/Output Summary (Last 24 hours) at 11/23/2022 1301 Last data filed at 11/22/2022 1745 Gross per 24 hour  Intake 200 ml  Output --  Net 200 ml   Filed Weights   11/22/22 0100  Weight: 111 kg    Examination:  General exam: NAD Respiratory system: Lungs clear.  Normal work of breathing.  Room air Cardiovascular system: S1-S2, RRR, no murmurs, no pedal edema Gastrointestinal system: Soft NT/ND, normal bowel sounds Central nervous system: Alert and oriented. No focal neurological deficits. Extremities: Decreased power LUE, LLE.  Decreased grip strength.  Decreased sensation Skin: Chronic appearing erythema on bilateral shins Psychiatry: Judgement and insight appear normal. Mood & affect appropriate.     Data Reviewed: I have personally reviewed following labs and imaging studies  CBC: Recent Labs  Lab 11/21/22 1450 11/23/22 0822  WBC 6.4 6.4  NEUTROABS 4.5 4.1  HGB 10.4* 9.3*  HCT 31.9* 29.0*  MCV 82.9 83.3  PLT 248 215   Basic Metabolic Panel: Recent Labs  Lab 11/21/22 1450 11/23/22 0822  NA 135 136  K 3.6 3.8  CL 103 101  CO2 27 26  GLUCOSE 157* 113*  BUN 41* 26*  CREATININE 1.51* 1.29*  CALCIUM 8.3* 8.2*  MG 1.6*  --   PHOS 2.9  --    GFR: Estimated Creatinine Clearance: 83.6 mL/min (A) (by C-G formula based on SCr of 1.29 mg/dL (H)). Liver Function Tests: Recent Labs  Lab 11/21/22 1450  AST 16  ALT 16  ALKPHOS 88  BILITOT 0.9  PROT 7.2  ALBUMIN 3.3*   No results for input(s): "LIPASE", "AMYLASE" in the last 168 hours. No results for input(s): "AMMONIA" in the last 168 hours. Coagulation Profile: No results for input(s): "INR", "PROTIME" in the last 168 hours. Cardiac Enzymes: No results for input(s): "CKTOTAL", "CKMB", "CKMBINDEX", "TROPONINI" in the last 168 hours. BNP (last 3 results) Recent  Labs    11/28/21 1520  PROBNP 966*   HbA1C: Recent Labs    11/22/22 0844  HGBA1C 7.0*   CBG: Recent Labs  Lab 11/22/22 0906 11/22/22 1231 11/22/22 1628 11/22/22 2103 11/23/22 0940  GLUCAP 126* 158* 113* 164* 168*   Lipid Profile: Recent Labs    11/22/22 0844  CHOL 120  HDL 45  LDLCALC 63  TRIG 58  CHOLHDL 2.7   Thyroid Function Tests: No results for input(s): "TSH", "T4TOTAL", "FREET4", "T3FREE", "THYROIDAB" in the last 72 hours. Anemia Panel: No results for input(s): "VITAMINB12", "FOLATE", "FERRITIN", "TIBC", "IRON", "RETICCTPCT" in the last 72 hours. Sepsis Labs: No results for input(s): "PROCALCITON", "LATICACIDVEN" in the last 168 hours.  No results found for this or any previous visit (from the past 240 hour(s)).       Radiology Studies: ECHOCARDIOGRAM COMPLETE  Result Date: 11/22/2022    ECHOCARDIOGRAM REPORT   Patient Name:   SYLVESTRE DROTAR Date of Exam: 11/22/2022 Medical Rec #:  [id number]       Height:       70.0 in Accession #:  [id number]      Weight:       244.7 lb Date of Birth:  05/04/1970       BSA:          2.274 m Patient Age:    51 years        BP:           154/84 mmHg Patient Gender: M               HR:           83 bpm. Exam Location:  [facility] Procedure: 2D Echo, Cardiac Doppler and Color Doppler Indications:     Stroke I63.9  History:         Patient has prior history of Echocardiogram examinations, most                  recent 11/23/2021. CHF, Stroke; Risk Factors:Hypertension.  Sonographer:     Cristela Blue Referring Phys:  [id number] Brien Few NIU Diagnosing Phys: Debbe Odea MD  Sonographer Comments: Suboptimal apical window. IMPRESSIONS  1. Left ventricular ejection fraction, by estimation, is 55 to 60%. The left ventricle has normal function. The left ventricle has no regional wall motion abnormalities. There is mild left ventricular hypertrophy. Left ventricular diastolic parameters are indeterminate.  2. Right ventricular systolic function is low  normal. The right ventricular size is mildly enlarged. There is normal pulmonary artery systolic pressure.  3. Left atrial size was mildly dilated.  4. The mitral valve is normal in structure. Mild mitral valve regurgitation.  5. The aortic valve is tricuspid. Aortic valve regurgitation is not visualized.  6. Aortic dilatation noted. There is borderline dilatation of the aortic root, measuring 38 mm.  7. The inferior vena cava is dilated in size with <50% respiratory variability, suggesting right atrial pressure of 15 mmHg. FINDINGS  Left Ventricle: Left ventricular ejection fraction, by estimation, is 55 to 60%. The left ventricle has normal function. The left ventricle has no regional wall motion abnormalities. The left ventricular internal cavity size was normal in size. There is  mild left ventricular hypertrophy. Left ventricular diastolic parameters are indeterminate. Right Ventricle: The right ventricular size is mildly enlarged. No increase in right ventricular wall thickness. Right ventricular systolic function is low normal. There is normal pulmonary artery systolic pressure. The tricuspid regurgitant velocity is 1.75 m/s, and with an assumed right atrial pressure of 15 mmHg, the estimated right ventricular systolic pressure is 27.2 mmHg. Left Atrium: Left atrial size was mildly dilated. Right Atrium: Right atrial size was normal in size. Pericardium: There is no evidence of pericardial effusion. Mitral Valve: The mitral valve is normal in structure. Mild mitral valve regurgitation. Tricuspid Valve: The tricuspid valve is normal in structure. Tricuspid valve regurgitation is not demonstrated. Aortic Valve: The aortic valve is tricuspid. Aortic valve regurgitation is not visualized. Aortic valve mean gradient measures 2.0 mmHg. Aortic valve peak gradient measures 3.2 mmHg. Aortic valve area, by VTI measures 3.12 cm. Pulmonic Valve: The pulmonic valve was normal in structure. Pulmonic valve regurgitation is  not visualized. Aorta: Aortic dilatation noted. There is borderline dilatation of the aortic root, measuring 38 mm. Venous: The inferior vena cava is dilated in size with less than 50% respiratory variability, suggesting right atrial pressure of 15 mmHg. IAS/Shunts: No atrial level shunt detected by color flow Doppler.  LEFT VENTRICLE PLAX 2D LVIDd:         4.80 cm LVIDs:         3.30  cm LV PW:         2.00 cm LV IVS:        1.60 cm LVOT diam:     2.30 cm LV SV:         40 LV SV Index:   18 LVOT Area:     4.15 cm  RIGHT VENTRICLE RV Basal diam:  4.80 cm RV Mid diam:    3.80 cm RV S prime:     12.40 cm/s TAPSE (M-mode): 2.6 cm LEFT ATRIUM           Index        RIGHT ATRIUM           Index LA diam:      4.90 cm 2.15 cm/m   RA Area:     19.80 cm LA Vol (A2C): 61.8 ml 27.17 ml/m  RA Volume:   58.60 ml  25.77 ml/m LA Vol (A4C): 78.6 ml 34.56 ml/m  AORTIC VALVE AV Area (Vmax):    2.79 cm AV Area (Vmean):   2.93 cm AV Area (VTI):     3.12 cm AV Vmax:           89.05 cm/s AV Vmean:          58.200 cm/s AV VTI:            0.130 m AV Peak Grad:      3.2 mmHg AV Mean Grad:      2.0 mmHg LVOT Vmax:         59.90 cm/s LVOT Vmean:        41.000 cm/s LVOT VTI:          0.097 m LVOT/AV VTI ratio: 0.75  AORTA Ao Root diam: 3.80 cm MITRAL VALVE               TRICUSPID VALVE MV Area (PHT): 5.58 cm    TR Peak grad:   12.2 mmHg MV Decel Time: 136 msec    TR Vmax:        175.00 cm/s MV E velocity: 92.20 cm/s                            SHUNTS                            Systemic VTI:  0.10 m                            Systemic Diam: 2.30 cm Debbe Odea MD Electronically signed by Debbe Odea MD Signature Date/Time: 11/22/2022/12:49:58 PM    Final    US Venous Img Lower Unilateral Left (DVT)  Result Date: 11/22/2022 CLINICAL DATA:  Left lower extremity pain and swelling EXAM: LEFT LOWER EXTREMITY VENOUS DOPPLER ULTRASOUND TECHNIQUE: Gray-scale sonography with compression, as well as color and duplex ultrasound,  were performed to evaluate the deep venous system(s) from the level of the common femoral vein through the popliteal and proximal calf veins. COMPARISON:  None Available. FINDINGS: VENOUS Normal compressibility of the common femoral, superficial femoral, and popliteal veins, as well as the visualized calf veins. Visualized portions of profunda femoral vein and great saphenous vein unremarkable. No filling defects to suggest DVT on grayscale or color Doppler imaging. Doppler waveforms show normal direction of venous flow, normal respiratory plasticity and response to augmentation. Limited views of the contralateral common  femoral vein are unremarkable. OTHER None. Limitations: none IMPRESSION: Negative. Electronically Signed   By: Malachy Moan M.D.   On: 11/22/2022 11:00   MR Brain W and Wo Contrast  Result Date: 11/21/2022 CLINICAL DATA:  Neuro deficit, acute, stroke suspected. Whole-body numbness, worse on left side. History of stroke with left-sided deficits. EXAM: MRI HEAD WITHOUT AND WITH CONTRAST MRA HEAD WITHOUT CONTRAST MRA NECK WITHOUT AND WITH CONTRAST TECHNIQUE: Multiplanar, multi-echo pulse sequences of the brain and surrounding structures were acquired without and with intravenous contrast. Angiographic images of the Circle of Willis were acquired using MRA technique without intravenous contrast. Angiographic images of the neck were acquired using MRA technique without and with intravenous contrast. Carotid stenosis measurements (when applicable) are obtained utilizing NASCET criteria, using the distal internal carotid diameter as the denominator. 3D post processing, including maximum intensity projections and multiplanar reformations, was performed for better evaluation of the vasculature. CONTRAST:  10mL GADAVIST GADOBUTROL 1 MMOL/ML IV SOLN COMPARISON:  MRI brain 05/20/2022.  Head CT 11/21/2022. FINDINGS: MRI HEAD FINDINGS Brain: Subacute infarct in the left frontal operculum with petechial  hemorrhage and mild surrounding edema. No significant mass effect or midline shift. Unchanged background of moderate chronic small-vessel disease with numerous perforator infarcts in the bilateral basal ganglia, thalami and cerebral white matter. Sequela of prior hemorrhage in the right external capsule/corona radiata. Multiple chronic microhemorrhages in the basal ganglia and pons. No hydrocephalus or extra-axial collection. No mass effect or midline shift. Vascular: Normal flow voids and vessel enhancement. Skull and upper cervical spine: Normal marrow signal and enhancement. Sinuses/Orbits: No acute or significant finding. Other: None. MRA HEAD FINDINGS Anterior circulation: Intracranial ICAs are patent without stenosis or aneurysm. The proximal ACAs and MCAs are patent without stenosis or aneurysm. Distal branches are symmetric. Posterior circulation: Visualized portions of the distal vertebral arteries and basilar artery are patent without stenosis or aneurysm. The SCAs, AICAs and PICAs are patent proximally. The PCAs are patent proximally without stenosis or aneurysm. Distal branches are symmetric. Anatomic variants: None. MRA NECK FINDINGS Aortic arch: Three-vessel arch configuration. Arch vessel origins are patent. Right carotid system: No evidence of dissection, stenosis (50% or greater), or occlusion. Tortuosity of the right cervical ICA. Left carotid system: No evidence of dissection, stenosis (50% or greater), or occlusion. Tortuosity of the left cervical ICA. Vertebral arteries: Left dominant. No evidence of dissection, stenosis (50% or greater), or occlusion. The right vertebral artery functionally terminates in PICA. Other: None. IMPRESSION: 1. Subacute infarct in the left frontal operculum with petechial hemorrhage and mild surrounding edema. No significant mass effect or midline shift. 2. No large vessel occlusion, hemodynamically significant stenosis, or aneurysm in the head or neck. Electronically  Signed   By: Orvan Falconer M.D.   On: 11/21/2022 18:19   MR ANGIO HEAD WO CONTRAST  Result Date: 11/21/2022 CLINICAL DATA:  Neuro deficit, acute, stroke suspected. Whole-body numbness, worse on left side. History of stroke with left-sided deficits. EXAM: MRI HEAD WITHOUT AND WITH CONTRAST MRA HEAD WITHOUT CONTRAST MRA NECK WITHOUT AND WITH CONTRAST TECHNIQUE: Multiplanar, multi-echo pulse sequences of the brain and surrounding structures were acquired without and with intravenous contrast. Angiographic images of the Circle of Willis were acquired using MRA technique without intravenous contrast. Angiographic images of the neck were acquired using MRA technique without and with intravenous contrast. Carotid stenosis measurements (when applicable) are obtained utilizing NASCET criteria, using the distal internal carotid diameter as the denominator. 3D post processing, including maximum intensity projections and  multiplanar reformations, was performed for better evaluation of the vasculature. CONTRAST:  10mL GADAVIST GADOBUTROL 1 MMOL/ML IV SOLN COMPARISON:  MRI brain 05/20/2022.  Head CT 11/21/2022. FINDINGS: MRI HEAD FINDINGS Brain: Subacute infarct in the left frontal operculum with petechial hemorrhage and mild surrounding edema. No significant mass effect or midline shift. Unchanged background of moderate chronic small-vessel disease with numerous perforator infarcts in the bilateral basal ganglia, thalami and cerebral white matter. Sequela of prior hemorrhage in the right external capsule/corona radiata. Multiple chronic microhemorrhages in the basal ganglia and pons. No hydrocephalus or extra-axial collection. No mass effect or midline shift. Vascular: Normal flow voids and vessel enhancement. Skull and upper cervical spine: Normal marrow signal and enhancement. Sinuses/Orbits: No acute or significant finding. Other: None. MRA HEAD FINDINGS Anterior circulation: Intracranial ICAs are patent without  stenosis or aneurysm. The proximal ACAs and MCAs are patent without stenosis or aneurysm. Distal branches are symmetric. Posterior circulation: Visualized portions of the distal vertebral arteries and basilar artery are patent without stenosis or aneurysm. The SCAs, AICAs and PICAs are patent proximally. The PCAs are patent proximally without stenosis or aneurysm. Distal branches are symmetric. Anatomic variants: None. MRA NECK FINDINGS Aortic arch: Three-vessel arch configuration. Arch vessel origins are patent. Right carotid system: No evidence of dissection, stenosis (50% or greater), or occlusion. Tortuosity of the right cervical ICA. Left carotid system: No evidence of dissection, stenosis (50% or greater), or occlusion. Tortuosity of the left cervical ICA. Vertebral arteries: Left dominant. No evidence of dissection, stenosis (50% or greater), or occlusion. The right vertebral artery functionally terminates in PICA. Other: None. IMPRESSION: 1. Subacute infarct in the left frontal operculum with petechial hemorrhage and mild surrounding edema. No significant mass effect or midline shift. 2. No large vessel occlusion, hemodynamically significant stenosis, or aneurysm in the head or neck. Electronically Signed   By: Orvan Falconer M.D.   On: 11/21/2022 18:19   MR Angiogram Neck W or Wo Contrast  Result Date: 11/21/2022 CLINICAL DATA:  Neuro deficit, acute, stroke suspected. Whole-body numbness, worse on left side. History of stroke with left-sided deficits. EXAM: MRI HEAD WITHOUT AND WITH CONTRAST MRA HEAD WITHOUT CONTRAST MRA NECK WITHOUT AND WITH CONTRAST TECHNIQUE: Multiplanar, multi-echo pulse sequences of the brain and surrounding structures were acquired without and with intravenous contrast. Angiographic images of the Circle of Willis were acquired using MRA technique without intravenous contrast. Angiographic images of the neck were acquired using MRA technique without and with intravenous contrast.  Carotid stenosis measurements (when applicable) are obtained utilizing NASCET criteria, using the distal internal carotid diameter as the denominator. 3D post processing, including maximum intensity projections and multiplanar reformations, was performed for better evaluation of the vasculature. CONTRAST:  10mL GADAVIST GADOBUTROL 1 MMOL/ML IV SOLN COMPARISON:  MRI brain 05/20/2022.  Head CT 11/21/2022. FINDINGS: MRI HEAD FINDINGS Brain: Subacute infarct in the left frontal operculum with petechial hemorrhage and mild surrounding edema. No significant mass effect or midline shift. Unchanged background of moderate chronic small-vessel disease with numerous perforator infarcts in the bilateral basal ganglia, thalami and cerebral white matter. Sequela of prior hemorrhage in the right external capsule/corona radiata. Multiple chronic microhemorrhages in the basal ganglia and pons. No hydrocephalus or extra-axial collection. No mass effect or midline shift. Vascular: Normal flow voids and vessel enhancement. Skull and upper cervical spine: Normal marrow signal and enhancement. Sinuses/Orbits: No acute or significant finding. Other: None. MRA HEAD FINDINGS Anterior circulation: Intracranial ICAs are patent without stenosis or aneurysm. The proximal ACAs  and MCAs are patent without stenosis or aneurysm. Distal branches are symmetric. Posterior circulation: Visualized portions of the distal vertebral arteries and basilar artery are patent without stenosis or aneurysm. The SCAs, AICAs and PICAs are patent proximally. The PCAs are patent proximally without stenosis or aneurysm. Distal branches are symmetric. Anatomic variants: None. MRA NECK FINDINGS Aortic arch: Three-vessel arch configuration. Arch vessel origins are patent. Right carotid system: No evidence of dissection, stenosis (50% or greater), or occlusion. Tortuosity of the right cervical ICA. Left carotid system: No evidence of dissection, stenosis (50% or greater),  or occlusion. Tortuosity of the left cervical ICA. Vertebral arteries: Left dominant. No evidence of dissection, stenosis (50% or greater), or occlusion. The right vertebral artery functionally terminates in PICA. Other: None. IMPRESSION: 1. Subacute infarct in the left frontal operculum with petechial hemorrhage and mild surrounding edema. No significant mass effect or midline shift. 2. No large vessel occlusion, hemodynamically significant stenosis, or aneurysm in the head or neck. Electronically Signed   By: Orvan Falconer M.D.   On: 11/21/2022 18:19   CT Head Wo Contrast  Result Date: 11/21/2022 CLINICAL DATA:  Neuro deficit, acute, stroke suspected EXAM: CT HEAD WITHOUT CONTRAST TECHNIQUE: Contiguous axial images were obtained from the base of the skull through the vertex without intravenous contrast. RADIATION DOSE REDUCTION: This exam was performed according to the departmental dose-optimization program which includes automated exposure control, adjustment of the mA and/or kV according to patient size and/or use of iterative reconstruction technique. COMPARISON:  Brain MR 05/20/22 FINDINGS: Brain: Compared to prior exam there is new cytotoxic edema in the left frontal lobe with associated blood products this is favored to represent an acute infarct with associated petechial hemorrhage. No evidence of a parenchymal hematoma. No hydrocephalus. No extra-axial fluid collection. Sequela of moderate chronic microvascular ischemic change with chronic infarcts in the right corona radiata and left thalamus. No mass effect. No midline shift. Vascular: No hyperdense vessel or unexpected calcification. Skull: Normal. Negative for fracture or focal lesion. Sinuses/Orbits: No middle ear or mastoid effusion. Paranasal sinuses are clear. Orbits are unremarkable. Other: None. IMPRESSION: Compared to prior exam there is new cytotoxic edema in the left frontal lobe with associated blood products. This is favored to  represent an acute to subacute infarct with likely petechial hemorrhage. No definite evidence of a parenchymal hematoma Electronically Signed   By: Lorenza Cambridge M.D.   On: 11/21/2022 15:41        Scheduled Meds:  apixaban  2.5 mg Oral BID   atorvastatin  40 mg Oral q1800   busPIRone  7.5 mg Oral BID   gabapentin  100 mg Oral BID   insulin aspart  0-5 Units Subcutaneous QHS   insulin aspart  0-9 Units Subcutaneous TID WC   lamoTRIgine  25 mg Oral QHS   pantoprazole  40 mg Oral BID AC   sertraline  50 mg Oral Daily   Continuous Infusions:   LOS: 1 day      Tresa Moore, MD [facility]   If 7PM-7AM, please contact night-coverage  11/23/2022, 1:01 PM

## 2022-11-23 NOTE — Plan of Care (Signed)
  Problem: Education: Goal: Knowledge of disease or condition will improve Outcome: Progressing Goal: Knowledge of secondary prevention will improve (MUST DOCUMENT ALL) Outcome: Progressing Goal: Knowledge of patient specific risk factors will improve Loraine Leriche N/A or DELETE if not current risk factor) Outcome: Progressing   Problem: Coping: Goal: Will verbalize positive feelings about self Outcome: Progressing Goal: Will identify appropriate support needs Outcome: Progressing   Problem: Health Behavior/Discharge Planning: Goal: Ability to manage health-related needs will improve Outcome: Progressing Goal: Goals will be collaboratively established with patient/family Outcome: Progressing   Problem: Self-Care: Goal: Ability to participate in self-care as condition permits will improve Outcome: Progressing   Problem: Nutrition: Goal: Risk of aspiration will decrease Outcome: Progressing Goal: Dietary intake will improve Outcome: Progressing   Problem: Fluid Volume: Goal: Ability to maintain a balanced intake and output will improve Outcome: Progressing   Problem: Skin Integrity: Goal: Risk for impaired skin integrity will decrease Outcome: Progressing   Problem: Tissue Perfusion: Goal: Adequacy of tissue perfusion will improve Outcome: Progressing

## 2022-11-24 DIAGNOSIS — I639 Cerebral infarction, unspecified: Secondary | ICD-10-CM | POA: Diagnosis not present

## 2022-11-24 LAB — GLUCOSE, CAPILLARY
Glucose-Capillary: 145 mg/dL — ABNORMAL HIGH (ref 70–99)
Glucose-Capillary: 182 mg/dL — ABNORMAL HIGH (ref 70–99)
Glucose-Capillary: 103 mg/dL — ABNORMAL HIGH (ref 70–99)
Glucose-Capillary: 113 mg/dL — ABNORMAL HIGH (ref 70–99)
Glucose-Capillary: 116 mg/dL — ABNORMAL HIGH (ref 70–99)

## 2022-11-24 LAB — CBC WITH DIFFERENTIAL/PLATELET
Abs Immature Granulocytes: 0.03 10*3/uL (ref 0.00–0.07)
Basophils Absolute: 0 10*3/uL (ref 0.0–0.1)
Basophils Relative: 1 %
Eosinophils Absolute: 0.3 10*3/uL (ref 0.0–0.5)
Eosinophils Relative: 4 %
HCT: 30.6 % — ABNORMAL LOW (ref 39.0–52.0)
Hemoglobin: 9.6 g/dL — ABNORMAL LOW (ref 13.0–17.0)
Immature Granulocytes: 0 %
Lymphocytes Relative: 11 %
Lymphs Abs: 0.8 10*3/uL (ref 0.7–4.0)
MCH: 26.5 pg (ref 26.0–34.0)
MCHC: 31.4 g/dL (ref 30.0–36.0)
MCV: 84.5 fL (ref 80.0–100.0)
Monocytes Absolute: 1 10*3/uL (ref 0.1–1.0)
Monocytes Relative: 15 %
Neutro Abs: 5 10*3/uL (ref 1.7–7.7)
Neutrophils Relative %: 69 %
Platelets: 225 10*3/uL (ref 150–400)
RBC: 3.62 MIL/uL — ABNORMAL LOW (ref 4.22–5.81)
RDW: 18.6 % — ABNORMAL HIGH (ref 11.5–15.5)
WBC: 7.1 10*3/uL (ref 4.0–10.5)
nRBC: 0 % (ref 0.0–0.2)

## 2022-11-24 NOTE — Plan of Care (Signed)

## 2022-11-24 NOTE — Progress Notes (Signed)
PROGRESS NOTE    Jeremiah Vance  WUJ:[id number] DOB: Aug 25, 1970 DOA: 11/21/2022 PCP: Debera Lat, PA-C    Brief Narrative:  52 y.o. male with medical history significant of GE junction carcinoma, HTN, HLD, DM, sCHF with EF 30-35%, stroke with mild left sided weakness, depression with anxiety, A-fib not on anticoagulants, obesity, who presents with left-sided numbness.   Patient states that he has history of stroke with mild left-sided weakness.  At about 9:30, he started feeling left-sided numbness.  His weakness in right arm and leg has not changed.  No vision change or hearing loss.  Patient denies chest pain, cough, SOB.  No fever or chills.  Denies symptoms of UTI.  No nausea, vomiting, diarrhea or abdominal pain.  Denies rectal bleeding or dark stool.   Assessment & Plan:   Principal Problem:   Stroke [facility]) Active Problems:   Paroxysmal atrial fibrillation (HCC)   GE junction carcinoma (HCC)   Hyperlipidemia   Chronic combined systolic and diastolic congestive heart failure (HCC)   Essential hypertension   Chronic kidney disease, stage 3b (HCC)   Hypomagnesemia   Type II diabetes mellitus with renal manifestations (HCC)   Depression with anxiety   Obesity (BMI 30-39.9)   Acute CVA (cerebrovascular accident) (HCC)  Stroke [facility]): pt has hx of stroke, now has left-sided numbness.  MRI of the brain with and without contrast showed subacute infarct in the left frontal operculum with petechial hemorrhage and mild surrounding edema. No significant mass effect or midline shift. MRA negative foir LVO. The MRI findings of left frontal operculum does not seem to explain his left-sided numbness.  Consulted Dr. Iver Nestle of neuro. Plan: After discussion with neurology and oncology decision made to start therapeutic anticoagulation.   Eliquis 2.5 mg twice daily started 8/23 No bleeding noted, hemoglobin stable Recheck hemoglobin 8/20 6 AM If stable will be medically ready for discharge to  CIR   Paroxysmal atrial fibrillation (HCC) Continue Eliquis 2.5 mg twice daily   GE junction carcinoma (HCC)  Stage IV.  Chemotherapy, last dose was on Tuesday.   Patient is following up with Dr. Cathie Hoops -Oncology consult appreciated.  Chemotherapy currently on hold   Hyperlipidemia -Lipitor   Chronic combined systolic and diastolic congestive heart failure (HCC)  2D echo on 11/23/2021 showed EF 30-35% with grade 1 diastolic dysfunction.  Patient does not have shortness breath, but he has elevated BNP 239.9 and leg edema, patient is at risk of developing CHF exacerbation. -Continue torsemide   Essential hypertension -Torsemide -IV hydralazine as needed   Chronic kidney disease, stage 3b (HCC): Slightly worsening than baseline.  Recent baseline creatinine 1.0-1.4.  His creatinine is 1.51, BUN 41, GFR 55. -Monitor renal function closely   Hypomagnesemia:  -Repleted magnesium   Type II diabetes mellitus with renal manifestations [facility]): Recent A1c 5.9, well-controlled.  Patient is taking farxiga at home -Sliding scale insulin   Depression with anxiety -Continue home medications   Obesity (BMI 30-39.9): Body weight 111.9 kg, BMI 35.39 -Encourage losing weight -Exercise and healthy diet  DVT prophylaxis: SCD Code Status: Full Family Communication: None Disposition Plan: Status is: Inpatient Remains inpatient appropriate because: Subacute CVA   Level of care: Telemetry Medical  Consultants:  Neurology Oncology  Procedures:  None  Antimicrobials: None   Subjective: Seen and examined.  Resting comfortably in bed.  No visible distress.  Numbness and weakness on left side is improving  Objective: Vitals:   11/23/22 1929 11/23/22 2315 11/24/22 0441 11/24/22 0757  BP: (!) 155/89 138/84 (!) 144/89 (!) 143/93  Pulse: 100 91 94 88  Resp: 19 17 17 18   Temp: 98 F (36.7 C) 98.9 F (37.2 C) 98 F (36.7 C) 97.7 F (36.5 C)  TempSrc: Oral   Oral  SpO2: 100% 96% 97% 97%   Weight:      Height:       No intake or output data in the 24 hours ending 11/24/22 1110  Filed Weights   11/22/22 0100  Weight: 111 kg    Examination:  General exam: No acute distress Respiratory system: Lungs clear.  Normal work of breathing.  Room air Cardiovascular system: S1-S2, RRR, no murmurs, no pedal edema Gastrointestinal system: Soft NT/ND, normal bowel sounds Central nervous system: Alert and oriented. No focal neurological deficits. Extremities: Decreased power LUE, LLE.  Decreased grip strength.  Decreased sensation.  Symptoms improving over interval Skin: Chronic appearing erythema on bilateral shins Psychiatry: Judgement and insight appear normal. Mood & affect appropriate.     Data Reviewed: I have personally reviewed following labs and imaging studies  CBC: Recent Labs  Lab 11/21/22 1450 11/23/22 0822 11/24/22 1019  WBC 6.4 6.4 7.1  NEUTROABS 4.5 4.1 5.0  HGB 10.4* 9.3* 9.6*  HCT 31.9* 29.0* 30.6*  MCV 82.9 83.3 84.5  PLT 248 215 225   Basic Metabolic Panel: Recent Labs  Lab 11/21/22 1450 11/23/22 0822  NA 135 136  K 3.6 3.8  CL 103 101  CO2 27 26  GLUCOSE 157* 113*  BUN 41* 26*  CREATININE 1.51* 1.29*  CALCIUM 8.3* 8.2*  MG 1.6*  --   PHOS 2.9  --    GFR: Estimated Creatinine Clearance: 83.6 mL/min (A) (by C-G formula based on SCr of 1.29 mg/dL (H)). Liver Function Tests: Recent Labs  Lab 11/21/22 1450  AST 16  ALT 16  ALKPHOS 88  BILITOT 0.9  PROT 7.2  ALBUMIN 3.3*   No results for input(s): "LIPASE", "AMYLASE" in the last 168 hours. No results for input(s): "AMMONIA" in the last 168 hours. Coagulation Profile: No results for input(s): "INR", "PROTIME" in the last 168 hours. Cardiac Enzymes: No results for input(s): "CKTOTAL", "CKMB", "CKMBINDEX", "TROPONINI" in the last 168 hours. BNP (last 3 results) Recent Labs    11/28/21 1520  PROBNP 966*   HbA1C: Recent Labs    11/22/22 0844  HGBA1C 7.0*   CBG: Recent  Labs  Lab 11/23/22 0940 11/23/22 1311 11/23/22 1648 11/23/22 2139 11/24/22 0933  GLUCAP 168* 150* 119* 132* 145*   Lipid Profile: Recent Labs    11/22/22 0844  CHOL 120  HDL 45  LDLCALC 63  TRIG 58  CHOLHDL 2.7   Thyroid Function Tests: No results for input(s): "TSH", "T4TOTAL", "FREET4", "T3FREE", "THYROIDAB" in the last 72 hours. Anemia Panel: No results for input(s): "VITAMINB12", "FOLATE", "FERRITIN", "TIBC", "IRON", "RETICCTPCT" in the last 72 hours. Sepsis Labs: No results for input(s): "PROCALCITON", "LATICACIDVEN" in the last 168 hours.  No results found for this or any previous visit (from the past 240 hour(s)).       Radiology Studies: ECHOCARDIOGRAM COMPLETE  Result Date: 11/22/2022    ECHOCARDIOGRAM REPORT   Patient Name:   SELDEN ROMO Date of Exam: 11/22/2022 Medical Rec #:  [id number]       Height:       70.0 in Accession #:    [id number]      Weight:       244.7 lb Date of Birth:  1971/02/27  BSA:          2.274 m Patient Age:    51 years        BP:           154/84 mmHg Patient Gender: M               HR:           83 bpm. Exam Location:  [facility] Procedure: 2D Echo, Cardiac Doppler and Color Doppler Indications:     Stroke I63.9  History:         Patient has prior history of Echocardiogram examinations, most                  recent 11/23/2021. CHF, Stroke; Risk Factors:Hypertension.  Sonographer:     Cristela Blue Referring Phys:  [id number] Brien Few NIU Diagnosing Phys: Debbe Odea MD  Sonographer Comments: Suboptimal apical window. IMPRESSIONS  1. Left ventricular ejection fraction, by estimation, is 55 to 60%. The left ventricle has normal function. The left ventricle has no regional wall motion abnormalities. There is mild left ventricular hypertrophy. Left ventricular diastolic parameters are indeterminate.  2. Right ventricular systolic function is low normal. The right ventricular size is mildly enlarged. There is normal pulmonary artery systolic pressure.  3.  Left atrial size was mildly dilated.  4. The mitral valve is normal in structure. Mild mitral valve regurgitation.  5. The aortic valve is tricuspid. Aortic valve regurgitation is not visualized.  6. Aortic dilatation noted. There is borderline dilatation of the aortic root, measuring 38 mm.  7. The inferior vena cava is dilated in size with <50% respiratory variability, suggesting right atrial pressure of 15 mmHg. FINDINGS  Left Ventricle: Left ventricular ejection fraction, by estimation, is 55 to 60%. The left ventricle has normal function. The left ventricle has no regional wall motion abnormalities. The left ventricular internal cavity size was normal in size. There is  mild left ventricular hypertrophy. Left ventricular diastolic parameters are indeterminate. Right Ventricle: The right ventricular size is mildly enlarged. No increase in right ventricular wall thickness. Right ventricular systolic function is low normal. There is normal pulmonary artery systolic pressure. The tricuspid regurgitant velocity is 1.75 m/s, and with an assumed right atrial pressure of 15 mmHg, the estimated right ventricular systolic pressure is 27.2 mmHg. Left Atrium: Left atrial size was mildly dilated. Right Atrium: Right atrial size was normal in size. Pericardium: There is no evidence of pericardial effusion. Mitral Valve: The mitral valve is normal in structure. Mild mitral valve regurgitation. Tricuspid Valve: The tricuspid valve is normal in structure. Tricuspid valve regurgitation is not demonstrated. Aortic Valve: The aortic valve is tricuspid. Aortic valve regurgitation is not visualized. Aortic valve mean gradient measures 2.0 mmHg. Aortic valve peak gradient measures 3.2 mmHg. Aortic valve area, by VTI measures 3.12 cm. Pulmonic Valve: The pulmonic valve was normal in structure. Pulmonic valve regurgitation is not visualized. Aorta: Aortic dilatation noted. There is borderline dilatation of the aortic root, measuring  38 mm. Venous: The inferior vena cava is dilated in size with less than 50% respiratory variability, suggesting right atrial pressure of 15 mmHg. IAS/Shunts: No atrial level shunt detected by color flow Doppler.  LEFT VENTRICLE PLAX 2D LVIDd:         4.80 cm LVIDs:         3.30 cm LV PW:         2.00 cm LV IVS:        1.60 cm LVOT diam:  2.30 cm LV SV:         40 LV SV Index:   18 LVOT Area:     4.15 cm  RIGHT VENTRICLE RV Basal diam:  4.80 cm RV Mid diam:    3.80 cm RV S prime:     12.40 cm/s TAPSE (M-mode): 2.6 cm LEFT ATRIUM           Index        RIGHT ATRIUM           Index LA diam:      4.90 cm 2.15 cm/m   RA Area:     19.80 cm LA Vol (A2C): 61.8 ml 27.17 ml/m  RA Volume:   58.60 ml  25.77 ml/m LA Vol (A4C): 78.6 ml 34.56 ml/m  AORTIC VALVE AV Area (Vmax):    2.79 cm AV Area (Vmean):   2.93 cm AV Area (VTI):     3.12 cm AV Vmax:           89.05 cm/s AV Vmean:          58.200 cm/s AV VTI:            0.130 m AV Peak Grad:      3.2 mmHg AV Mean Grad:      2.0 mmHg LVOT Vmax:         59.90 cm/s LVOT Vmean:        41.000 cm/s LVOT VTI:          0.097 m LVOT/AV VTI ratio: 0.75  AORTA Ao Root diam: 3.80 cm MITRAL VALVE               TRICUSPID VALVE MV Area (PHT): 5.58 cm    TR Peak grad:   12.2 mmHg MV Decel Time: 136 msec    TR Vmax:        175.00 cm/s MV E velocity: 92.20 cm/s                            SHUNTS                            Systemic VTI:  0.10 m                            Systemic Diam: 2.30 cm Debbe Odea MD Electronically signed by Debbe Odea MD Signature Date/Time: 11/22/2022/12:49:58 PM    Final         Scheduled Meds:  apixaban  2.5 mg Oral BID   atorvastatin  40 mg Oral q1800   busPIRone  7.5 mg Oral BID   gabapentin  100 mg Oral BID   insulin aspart  0-5 Units Subcutaneous QHS   insulin aspart  0-9 Units Subcutaneous TID WC   lamoTRIgine  25 mg Oral QHS   pantoprazole  40 mg Oral BID AC   sertraline  50 mg Oral Daily   Continuous Infusions:   LOS: 2  days      Tresa Moore, MD [facility]   If 7PM-7AM, please contact night-coverage  11/24/2022, 11:10 AM

## 2022-11-24 NOTE — Progress Notes (Signed)
Physical Therapy Treatment Patient Details Name: DEVARSH CATTANACH MRN: [id number] DOB: 15-Sep-1970 Today's Date: 11/24/2022   History of Present Illness Pt is a 52 year old male presenting to the ED with L sided numbness MRI of the brain with and without contrast showed subacute infarct in the left frontal operculum with petechial hemorrhage and mild surrounding edema. No significant mass effect or midline shift. MRA negative foir LVO. The MRI findings of left frontal operculum does not seem to explain his left-sided numbnes    PMH significant for GE junction carcinoma, HTN, HLD, DM, sCHF with EF 30-35%, stroke with mild left sided weakness, depression with anxiety, A-fib not on anticoagulants, obesity    PT Comments  Pt ready for session.  Is able to get to EOB with rail but no assist.  Stands to RW with min a x 1 and does grimace in discomfort BLE's.  He is able to walk 5' in room before c/o light headedness where is is directed to return to chair.  Orthostatic BP's are taken and generally stable.  Initial 20 point systolic drop but recovers at 3 minute check 144/81 P 89.  Vitals are documented in flow sheets.  He is able to remain standing for BP.  He does endorse being generally fatigued but remains in chair after session.  Education on call bell use and to wait for assist for mobility.  Voiced understanding. He is excited to transition to AIR if available to him.     If plan is discharge home, recommend the following: A lot of help with walking and/or transfers;A lot of help with bathing/dressing/bathroom;Assistance with cooking/housework;Help with stairs or ramp for entrance;Assist for transportation   Can travel by private vehicle        Equipment Recommendations       Recommendations for Other Services       Precautions / Restrictions Precautions Precautions: Fall Restrictions Weight Bearing Restrictions: No     Mobility  Bed Mobility Overal bed mobility: Needs Assistance Bed  Mobility: Supine to Sit     Supine to sit: Contact guard       Patient Response: Cooperative  Transfers Overall transfer level: Needs assistance Equipment used: Rolling walker (2 wheels) Transfers: Sit to/from Stand Sit to Stand: Min assist                Ambulation/Gait Ambulation/Gait assistance: Editor, commissioning (Feet): 6 Feet Assistive device: Rolling walker (2 wheels) Gait Pattern/deviations: Step-through pattern, Decreased step length - right, Decreased step length - left Gait velocity: decreased         Stairs             Wheelchair Mobility     Tilt Bed Tilt Bed Patient Response: Cooperative  Modified Rankin (Stroke Patients Only)       Balance Overall balance assessment: Needs assistance Sitting-balance support: No upper extremity supported, Feet supported Sitting balance-Leahy Scale: Good     Standing balance support: Bilateral upper extremity supported Standing balance-Leahy Scale: Fair                              Cognition Arousal: Alert Behavior During Therapy: Impulsive Overall Cognitive Status: No family/caregiver present to determine baseline cognitive functioning                                 General Comments: pt with ?  higher level cognitive deficits, will continue to assess        Exercises      General Comments        Pertinent Vitals/Pain Pain Assessment Pain Assessment: Faces Faces Pain Scale: Hurts a little bit Pain Location: generalized Pain Descriptors / Indicators: Sore, Grimacing Pain Intervention(s): Limited activity within patient's tolerance, Monitored during session, Repositioned    Home Living                          Prior Function            PT Goals (current goals can now be found in the care plan section) Progress towards PT goals: Progressing toward goals    Frequency    Min 1X/week      PT Plan      Co-evaluation               AM-PAC PT "6 Clicks" Mobility   Outcome Measure  Help needed turning from your back to your side while in a flat bed without using bedrails?: None Help needed moving from lying on your back to sitting on the side of a flat bed without using bedrails?: A Little Help needed moving to and from a bed to a chair (including a wheelchair)?: A Little Help needed standing up from a chair using your arms (e.g., wheelchair or bedside chair)?: A Little Help needed to walk in hospital room?: A Little Help needed climbing 3-5 steps with a railing? : A Lot 6 Click Score: 18    End of Session Equipment Utilized During Treatment: Gait belt Activity Tolerance: Patient limited by fatigue Patient left: in chair;with chair alarm set;with call bell/phone within reach Nurse Communication: Mobility status PT Visit Diagnosis: Muscle weakness (generalized) (M62.81);Difficulty in walking, not elsewhere classified (R26.2);Pain Pain - Right/Left: Left Pain - part of body: Leg     Time: 8413-2440 PT Time Calculation (min) (ACUTE ONLY): 15 min  Charges:    $Gait Training: 8-22 mins PT General Charges $$ ACUTE PT VISIT: 1 Visit                   Danielle Dess, PTA 11/24/22, 11:55 AM

## 2022-11-25 ENCOUNTER — Encounter: Payer: Self-pay | Admitting: Oncology

## 2022-11-25 ENCOUNTER — Ambulatory Visit: Payer: Medicaid Other | Admitting: Occupational Therapy

## 2022-11-25 ENCOUNTER — Other Ambulatory Visit (HOSPITAL_COMMUNITY): Payer: Self-pay

## 2022-11-25 ENCOUNTER — Ambulatory Visit: Payer: Medicaid Other | Admitting: Physical Therapy

## 2022-11-25 DIAGNOSIS — I639 Cerebral infarction, unspecified: Secondary | ICD-10-CM | POA: Diagnosis not present

## 2022-11-25 LAB — GLUCOSE, CAPILLARY
Glucose-Capillary: 111 mg/dL — ABNORMAL HIGH (ref 70–99)
Glucose-Capillary: 174 mg/dL — ABNORMAL HIGH (ref 70–99)
Glucose-Capillary: 188 mg/dL — ABNORMAL HIGH (ref 70–99)
Glucose-Capillary: 126 mg/dL — ABNORMAL HIGH (ref 70–99)

## 2022-11-25 NOTE — Progress Notes (Signed)
Inpatient Rehab Admissions Coordinator:  Spoke with pt on the telephone. Informed him that started insurance authorization. Will continue to follow.   Wolfgang Phoenix, MS, CCC-SLP Admissions Coordinator [phone]

## 2022-11-25 NOTE — Progress Notes (Signed)
Physical Therapy Treatment Patient Details Name: Jeremiah Vance MRN: [id number] DOB: 1970-10-04 Today's Date: 11/25/2022   History of Present Illness Pt is a 52 year old male presenting to the ED with L sided numbness MRI of the brain with and without contrast showed subacute infarct in the left frontal operculum with petechial hemorrhage and mild surrounding edema. No significant mass effect or midline shift. MRA negative foir LVO. The MRI findings of left frontal operculum does not seem to explain his left-sided numbnes    PMH significant for GE junction carcinoma, HTN, HLD, DM, sCHF with EF 30-35%, stroke with mild left sided weakness, depression with anxiety, A-fib not on anticoagulants, obesity    PT Comments  Pt ready for session in recliner.  He reports dizziness is improved today.  Stands with min a x 1 and is able to walk to door and back on 2 attempts with seated rest break.  Slow cautious gait noted with occasional standing rest breaks due to fatigue.  He stated he did use a walker at home prior but was able to walk unlimited distances in his home without difficulty.  Standing exercises are done with walker support and min assist for balance.  Marches, SLR x 10 before self initiated seated rest then stands for heel raises x 10 and sit to stands x 5.  Lunch tray arrives and he requests to eat as it is approaching 1:00.    Pt continues to work hard during therapy sessions.  Motivated to return to baseline but safety deficits and general weakness/tolerance is noted.  Pt is reminded to call for staff assist for mobility.  Risks/benefits are explained to unassisted mobility.  Voiced understanding.   If plan is discharge home, recommend the following: Assistance with cooking/housework;Help with stairs or ramp for entrance;Assist for transportation;A little help with walking and/or transfers;A little help with bathing/dressing/bathroom   Can travel by private vehicle        Equipment  Recommendations       Recommendations for Other Services       Precautions / Restrictions Precautions Precautions: Fall Restrictions Weight Bearing Restrictions: No     Mobility  Bed Mobility               General bed mobility comments: NT in recliner pre/post session Patient Response: Cooperative  Transfers Overall transfer level: Needs assistance Equipment used: Rolling walker (2 wheels) Transfers: Sit to/from Stand Sit to Stand: Min assist                Ambulation/Gait Ambulation/Gait assistance: Min Chemical engineer (Feet): 25 Feet Assistive device: Rolling walker (2 wheels) Gait Pattern/deviations: Step-through pattern, Decreased step length - right, Decreased step length - left Gait velocity: decreased     General Gait Details: fatigues easily.  was able to walk unlimited distances at home prior to admission   Stairs             Wheelchair Mobility     Tilt Bed Tilt Bed Patient Response: Cooperative  Modified Rankin (Stroke Patients Only)       Balance Overall balance assessment: Needs assistance Sitting-balance support: No upper extremity supported, Feet supported Sitting balance-Leahy Scale: Good     Standing balance support: Bilateral upper extremity supported Standing balance-Leahy Scale: Fair                              Cognition Arousal: Alert Behavior During Therapy: Impulsive Overall  Cognitive Status: No family/caregiver present to determine baseline cognitive functioning Area of Impairment: Safety/judgement                                        Exercises      General Comments        Pertinent Vitals/Pain Pain Assessment Pain Assessment: Faces Faces Pain Scale: Hurts a little bit Pain Location: generalized Pain Descriptors / Indicators: Sore, Grimacing Pain Intervention(s): Limited activity within patient's tolerance, Monitored during session, Repositioned    Home  Living                          Prior Function            PT Goals (current goals can now be found in the care plan section) Progress towards PT goals: Progressing toward goals    Frequency    Min 1X/week      PT Plan      Co-evaluation              AM-PAC PT "6 Clicks" Mobility   Outcome Measure  Help needed turning from your back to your side while in a flat bed without using bedrails?: None Help needed moving from lying on your back to sitting on the side of a flat bed without using bedrails?: A Little Help needed moving to and from a bed to a chair (including a wheelchair)?: A Little Help needed standing up from a chair using your arms (e.g., wheelchair or bedside chair)?: A Little Help needed to walk in hospital room?: A Little Help needed climbing 3-5 steps with a railing? : A Lot 6 Click Score: 18    End of Session Equipment Utilized During Treatment: Gait belt Activity Tolerance: Patient limited by fatigue Patient left: in chair;with chair alarm set;with call bell/phone within reach Nurse Communication: Mobility status PT Visit Diagnosis: Muscle weakness (generalized) (M62.81);Difficulty in walking, not elsewhere classified (R26.2);Pain Pain - Right/Left: Left Pain - part of body: Leg     Time: 1251-1310 PT Time Calculation (min) (ACUTE ONLY): 19 min  Charges:    $Gait Training: 8-22 mins PT General Charges $$ ACUTE PT VISIT: 1 Visit                   Danielle Dess, PTA 11/25/22, 1:39 PM

## 2022-11-25 NOTE — TOC Benefit Eligibility Note (Signed)
Patient Product/process development scientist completed.    The patient is insured through [facility] MEDICAID.     Ran test claim for Eliquis 2.5 mg and the current 30 day co-pay is $4.00.   This test claim was processed through [facility]- copay amounts may vary at other pharmacies due to pharmacy/plan contracts, or as the patient moves through the different stages of their insurance plan.     Roland Earl, CPHT Pharmacy Technician III Certified Patient Advocate [facility] Patient Advocate Team Direct Number: [phone]  Fax: [phone]

## 2022-11-25 NOTE — Progress Notes (Signed)
PROGRESS NOTE    Jeremiah Vance  KVQ:[id number] DOB: October 01, 1970 DOA: 11/21/2022 PCP: Debera Lat, PA-C    Brief Narrative:  52 y.o. male with medical history significant of GE junction carcinoma, HTN, HLD, DM, sCHF with EF 30-35%, stroke with mild left sided weakness, depression with anxiety, A-fib not on anticoagulants, obesity, who presents with left-sided numbness.   Patient states that he has history of stroke with mild left-sided weakness.  At about 9:30, he started feeling left-sided numbness.  His weakness in right arm and leg has not changed.  No vision change or hearing loss.  Patient denies chest pain, cough, SOB.  No fever or chills.  Denies symptoms of UTI.  No nausea, vomiting, diarrhea or abdominal pain.  Denies rectal bleeding or dark stool.   Assessment & Plan:   Principal Problem:   Stroke [facility]) Active Problems:   Paroxysmal atrial fibrillation (HCC)   GE junction carcinoma (HCC)   Hyperlipidemia   Chronic combined systolic and diastolic congestive heart failure (HCC)   Essential hypertension   Chronic kidney disease, stage 3b (HCC)   Hypomagnesemia   Type II diabetes mellitus with renal manifestations (HCC)   Depression with anxiety   Obesity (BMI 30-39.9)   Acute CVA (cerebrovascular accident) (HCC)  Stroke [facility]): pt has hx of stroke, now has left-sided numbness.  MRI of the brain with and without contrast showed subacute infarct in the left frontal operculum with petechial hemorrhage and mild surrounding edema. No significant mass effect or midline shift. MRA negative foir LVO. The MRI findings of left frontal operculum does not seem to explain his left-sided numbness.  Consulted Dr. Iver Nestle of neuro.After discussion with neurology and oncology decision made to start therapeutic anticoagulation.  Plan: Continue Eliquis 2.5 mg twice daily (started 8/23) Medically stable for discharge at this time Pending placement at [facility]   Paroxysmal atrial fibrillation  (HCC) Continue Eliquis 2.5 mg twice daily   GE junction carcinoma (HCC)  Stage IV.  Chemotherapy, last dose was on Tuesday.   Patient is following up with Dr. Cathie Hoops -Oncology consult appreciated.  Chemotherapy currently on hold   Hyperlipidemia -Lipitor   Chronic combined systolic and diastolic congestive heart failure (HCC)  2D echo on 11/23/2021 showed EF 30-35% with grade 1 diastolic dysfunction.  Patient does not have shortness breath, but he has elevated BNP 239.9 and leg edema, patient is at risk of developing CHF exacerbation. -Continue torsemide   Essential hypertension -Torsemide -IV hydralazine as needed   Chronic kidney disease, stage 3b (HCC): Slightly worsening than baseline.  Recent baseline creatinine 1.0-1.4.  His creatinine is 1.51, BUN 41, GFR 55. -Monitor renal function closely   Hypomagnesemia:  -Repleted magnesium   Type II diabetes mellitus with renal manifestations [facility]): Recent A1c 5.9, well-controlled.  Patient is taking farxiga at home -Sliding scale insulin   Depression with anxiety -Continue home medications   Obesity (BMI 30-39.9): Body weight 111.9 kg, BMI 35.39 -Encourage losing weight -Exercise and healthy diet  DVT prophylaxis: SCD Code Status: Full Family Communication: None Disposition Plan: Status is: Inpatient Remains inpatient appropriate because: Subacute CVA   Level of care: Telemetry Medical  Consultants:  Neurology Oncology  Procedures:  None  Antimicrobials: None   Subjective: Seen and examined.  Resting comfortably in bed.  No visible distress  Objective: Vitals:   11/24/22 2115 11/25/22 0044 11/25/22 0509 11/25/22 0832  BP: (!) 155/81 (!) 148/88 135/88 (!) 141/95  Pulse: 93 92 88 81  Resp:  16  Temp: 99.5 F (37.5 C) 98.6 F (37 C) 98.3 F (36.8 C) 98.3 F (36.8 C)  TempSrc: Oral Oral Oral Oral  SpO2: 97% 98% 98% 99%  Weight:      Height:        Intake/Output Summary (Last 24 hours) at 11/25/2022  1109 Last data filed at 11/24/2022 1938 Gross per 24 hour  Intake 480 ml  Output 50 ml  Net 430 ml    Filed Weights   11/22/22 0100  Weight: 111 kg    Examination:  General exam: NAD Respiratory system: Lungs clear.  Normal work of breathing.  Room air Cardiovascular system: S1-S2, RRR, no murmurs, no pedal edema Gastrointestinal system: Soft NT/ND, normal bowel sounds Central nervous system: Alert and oriented. No focal neurological deficits. Extremities: Decreased power LUE, LLE.  Decreased grip strength.  Decreased sensation.  Symptoms improving over interval Skin: Chronic appearing erythema on bilateral shins Psychiatry: Judgement and insight appear normal. Mood & affect appropriate.     Data Reviewed: I have personally reviewed following labs and imaging studies  CBC: Recent Labs  Lab 11/21/22 1450 11/23/22 0822 11/24/22 1019  WBC 6.4 6.4 7.1  NEUTROABS 4.5 4.1 5.0  HGB 10.4* 9.3* 9.6*  HCT 31.9* 29.0* 30.6*  MCV 82.9 83.3 84.5  PLT 248 215 225   Basic Metabolic Panel: Recent Labs  Lab 11/21/22 1450 11/23/22 0822  NA 135 136  K 3.6 3.8  CL 103 101  CO2 27 26  GLUCOSE 157* 113*  BUN 41* 26*  CREATININE 1.51* 1.29*  CALCIUM 8.3* 8.2*  MG 1.6*  --   PHOS 2.9  --    GFR: Estimated Creatinine Clearance: 83.6 mL/min (A) (by C-G formula based on SCr of 1.29 mg/dL (H)). Liver Function Tests: Recent Labs  Lab 11/21/22 1450  AST 16  ALT 16  ALKPHOS 88  BILITOT 0.9  PROT 7.2  ALBUMIN 3.3*   No results for input(s): "LIPASE", "AMYLASE" in the last 168 hours. No results for input(s): "AMMONIA" in the last 168 hours. Coagulation Profile: No results for input(s): "INR", "PROTIME" in the last 168 hours. Cardiac Enzymes: No results for input(s): "CKTOTAL", "CKMB", "CKMBINDEX", "TROPONINI" in the last 168 hours. BNP (last 3 results) Recent Labs    11/28/21 1520  PROBNP 966*   HbA1C: No results for input(s): "HGBA1C" in the last 72  hours.  CBG: Recent Labs  Lab 11/24/22 1238 11/24/22 1718 11/24/22 2045 11/24/22 2115 11/25/22 0831  GLUCAP 182* 103* 116* 113* 111*   Lipid Profile: No results for input(s): "CHOL", "HDL", "LDLCALC", "TRIG", "CHOLHDL", "LDLDIRECT" in the last 72 hours.  Thyroid Function Tests: No results for input(s): "TSH", "T4TOTAL", "FREET4", "T3FREE", "THYROIDAB" in the last 72 hours. Anemia Panel: No results for input(s): "VITAMINB12", "FOLATE", "FERRITIN", "TIBC", "IRON", "RETICCTPCT" in the last 72 hours. Sepsis Labs: No results for input(s): "PROCALCITON", "LATICACIDVEN" in the last 168 hours.  No results found for this or any previous visit (from the past 240 hour(s)).       Radiology Studies: No results found.      Scheduled Meds:  apixaban  2.5 mg Oral BID   atorvastatin  40 mg Oral q1800   busPIRone  7.5 mg Oral BID   gabapentin  100 mg Oral BID   insulin aspart  0-5 Units Subcutaneous QHS   insulin aspart  0-9 Units Subcutaneous TID WC   lamoTRIgine  25 mg Oral QHS   pantoprazole  40 mg Oral BID AC  sertraline  50 mg Oral Daily   Continuous Infusions:   LOS: 3 days      Tresa Moore, MD [facility]   If 7PM-7AM, please contact night-coverage  11/25/2022, 11:09 AM

## 2022-11-26 ENCOUNTER — Ambulatory Visit: Payer: Medicaid Other

## 2022-11-26 ENCOUNTER — Other Ambulatory Visit: Payer: Self-pay | Admitting: Oncology

## 2022-11-26 ENCOUNTER — Inpatient Hospital Stay: Payer: Medicaid Other | Admitting: Oncology

## 2022-11-26 ENCOUNTER — Inpatient Hospital Stay: Payer: Medicaid Other

## 2022-11-26 DIAGNOSIS — I639 Cerebral infarction, unspecified: Secondary | ICD-10-CM | POA: Diagnosis not present

## 2022-11-26 DIAGNOSIS — C160 Malignant neoplasm of cardia: Secondary | ICD-10-CM

## 2022-11-26 LAB — GLUCOSE, CAPILLARY
Glucose-Capillary: 102 mg/dL — ABNORMAL HIGH (ref 70–99)
Glucose-Capillary: 168 mg/dL — ABNORMAL HIGH (ref 70–99)
Glucose-Capillary: 139 mg/dL — ABNORMAL HIGH (ref 70–99)
Glucose-Capillary: 127 mg/dL — ABNORMAL HIGH (ref 70–99)

## 2022-11-26 NOTE — Progress Notes (Signed)
Inpatient Rehabilitation Admissions Coordinator   I await insurance approval for possible CIR admit.  Ottie Glazier, RN, MSN Rehab Admissions Coordinator [phone] 11/26/2022 10:59 AM

## 2022-11-26 NOTE — Progress Notes (Signed)
Inpatient Rehabilitation Admissions Coordinator   I have received insurance approval for CIR admit and am hopeful for  bed in the next 24 to 48 hrs to admit. I contacted patient by phone and he is aware and in agreement. Acute team and TOC made aware.  Ottie Glazier, RN, MSN Rehab Admissions Coordinator [phone] 11/26/2022 4:19 PM

## 2022-11-26 NOTE — Progress Notes (Signed)
Physical Therapy Treatment Patient Details Name: Jeremiah Vance MRN: [id number] DOB: September 09, 1970 Today's Date: 11/26/2022   History of Present Illness Pt is a 52 year old male presenting to the ED with L sided numbness MRI of the brain with and without contrast showed subacute infarct in the left frontal operculum with petechial hemorrhage and mild surrounding edema. No significant mass effect or midline shift. MRA negative foir LVO. The MRI findings of left frontal operculum does not seem to explain his left-sided numbnes    PMH significant for GE junction carcinoma, HTN, HLD, DM, sCHF with EF 30-35%, stroke with mild left sided weakness, depression with anxiety, A-fib not on anticoagulants, obesity    PT Comments  Pt in chair, ready for session.  He is able to stand to RW and progress gait in hallway today 75'.  Gait remains slow with cautious steps with decreased step height but increasing length.  He continues to rely heavily on RW and is fatigued with task.  During rest break, pt asks questions regarding AIR program and they are answered as appropriate. He is able to stand for standing ex with walker support and time is spend with dynamic standing tasks reaching left/right to move objects crossing midline then reaching outside of BOS/high/low.  Pt continues to make daily progress with mobility.  He continues to struggle with confident mobility and ADL tasks.  During discussion pt stated he mostly microwaves his food as vision is a barrier for using the stove. He is aware he is unsafe to use it at this time.  He does struggle seeing objects and needs cues to scan to see things during therapy tasks.  He stated he often burns his fingers on hot dishes due to decreased sensation.  He will benefit from continued education and adaptive equipment/techniques to help increase safety and independence at home as vision and sensory deficits to seem to be having a greater impact at home recently.  Stated his  sister is supportive.  He is encouraged to discuss challenges with AIR staff upon admission if insurance Berkley Harvey is received.  If he is denied, he will benefit from increased assist and therapies at home upon discharge.   If plan is discharge home, recommend the following: Assistance with cooking/housework;Help with stairs or ramp for entrance;Assist for transportation;A little help with walking and/or transfers;A little help with bathing/dressing/bathroom   Can travel by private vehicle        Equipment Recommendations       Recommendations for Other Services       Precautions / Restrictions Precautions Precautions: Fall Restrictions Weight Bearing Restrictions: No     Mobility  Bed Mobility               General bed mobility comments: NT in recliner pre/post session Patient Response: Cooperative  Transfers Overall transfer level: Needs assistance Equipment used: Rolling walker (2 wheels) Transfers: Sit to/from Stand Sit to Stand: Min assist                Ambulation/Gait Ambulation/Gait assistance: Min Chemical engineer (Feet): 75 Feet Assistive device: Rolling walker (2 wheels) Gait Pattern/deviations: Step-through pattern, Decreased step length - right, Decreased step length - left Gait velocity: decreased     General Gait Details: distance is progressing daily with increased confidence and improving balance but gait remais slow and cautious   Stairs             Wheelchair Mobility     Tilt Bed Tilt  Bed Patient Response: Cooperative  Modified Rankin (Stroke Patients Only)       Balance Overall balance assessment: Needs assistance Sitting-balance support: No upper extremity supported, Feet supported Sitting balance-Leahy Scale: Good     Standing balance support: Bilateral upper extremity supported Standing balance-Leahy Scale: Fair Standing balance comment: continues to need heavy support of walker with gait and reaching  activities                            Cognition Arousal: Alert Behavior During Therapy: Impulsive, WFL for tasks assessed/performed Overall Cognitive Status: No family/caregiver present to determine baseline cognitive functioning Area of Impairment: Orientation                                        Exercises Other Exercises Other Exercises: standing ex with walker support for marches, SLR, heel raises x 10 Other Exercises: dynamic standing tasks with RW one had support reaching outside BOS/high/low/forward    General Comments        Pertinent Vitals/Pain Pain Assessment Pain Assessment: Faces Faces Pain Scale: Hurts even more Pain Location: B feet doing heel raises Pain Descriptors / Indicators: Sore, Grimacing Pain Intervention(s): Limited activity within patient's tolerance, Monitored during session, Repositioned    Home Living                          Prior Function            PT Goals (current goals can now be found in the care plan section) Progress towards PT goals: Progressing toward goals    Frequency    Min 1X/week      PT Plan      Co-evaluation              AM-PAC PT "6 Clicks" Mobility   Outcome Measure  Help needed turning from your back to your side while in a flat bed without using bedrails?: None Help needed moving from lying on your back to sitting on the side of a flat bed without using bedrails?: None Help needed moving to and from a bed to a chair (including a wheelchair)?: A Little Help needed standing up from a chair using your arms (e.g., wheelchair or bedside chair)?: A Little Help needed to walk in hospital room?: A Little Help needed climbing 3-5 steps with a railing? : A Lot 6 Click Score: 19    End of Session Equipment Utilized During Treatment: Gait belt Activity Tolerance: Patient limited by fatigue Patient left: in chair;with call bell/phone within reach Nurse Communication:  Mobility status PT Visit Diagnosis: Muscle weakness (generalized) (M62.81);Difficulty in walking, not elsewhere classified (R26.2);Pain Pain - Right/Left: Left Pain - part of body: Leg     Time: 1110-1135 PT Time Calculation (min) (ACUTE ONLY): 25 min  Charges:    $Gait Training: 8-22 mins $Therapeutic Exercise: 8-22 mins PT General Charges $$ ACUTE PT VISIT: 1 Visit                   Danielle Dess, PTA 11/26/22, 11:48 AM

## 2022-11-26 NOTE — Progress Notes (Signed)
PROGRESS NOTE    Jeremiah Vance  NWG:[id number] DOB: March 04, 1971 DOA: 11/21/2022 PCP: Debera Lat, PA-C    Brief Narrative:  52 y.o. male with medical history significant of GE junction carcinoma, HTN, HLD, DM, sCHF with EF 30-35%, stroke with mild left sided weakness, depression with anxiety, A-fib not on anticoagulants, obesity, who presents with left-sided numbness.   Patient states that he has history of stroke with mild left-sided weakness.  At about 9:30, he started feeling left-sided numbness.  His weakness in right arm and leg has not changed.  No vision change or hearing loss.  Patient denies chest pain, cough, SOB.  No fever or chills.  Denies symptoms of UTI.  No nausea, vomiting, diarrhea or abdominal pain.  Denies rectal bleeding or dark stool.  8/27: Patient medically stable awaiting discharge to CIR.  Pending insurance authorization   Assessment & Plan:   Principal Problem:   Stroke [facility]) Active Problems:   Paroxysmal atrial fibrillation (HCC)   GE junction carcinoma (HCC)   Hyperlipidemia   Chronic combined systolic and diastolic congestive heart failure (HCC)   Essential hypertension   Chronic kidney disease, stage 3b (HCC)   Hypomagnesemia   Type II diabetes mellitus with renal manifestations (HCC)   Depression with anxiety   Obesity (BMI 30-39.9)   Acute CVA (cerebrovascular accident) (HCC)  Stroke [facility]): pt has hx of stroke, now has left-sided numbness.  MRI of the brain with and without contrast showed subacute infarct in the left frontal operculum with petechial hemorrhage and mild surrounding edema. No significant mass effect or midline shift. MRA negative foir LVO. The MRI findings of left frontal operculum does not seem to explain his left-sided numbness.  Consulted Dr. Iver Nestle of neuro.After discussion with neurology and oncology decision made to start therapeutic anticoagulation.  Plan: Continue Eliquis 2.5 mg twice daily (started 8/23) Medically stable  for discharge at this time Pending placement at [facility], pending insurance authorization   Paroxysmal atrial fibrillation (HCC) Continue Eliquis 2.5 mg twice daily   GE junction carcinoma (HCC)  Stage IV.  Chemotherapy, last dose was on Tuesday.   Patient is following up with Dr. Cathie Hoops -Oncology consult appreciated.  Chemotherapy currently on hold   Hyperlipidemia -Lipitor   Chronic combined systolic and diastolic congestive heart failure (HCC)  2D echo on 11/23/2021 showed EF 30-35% with grade 1 diastolic dysfunction.  Patient does not have shortness breath, but he has elevated BNP 239.9 and leg edema, patient is at risk of developing CHF exacerbation. -Continue torsemide   Essential hypertension -Torsemide -IV hydralazine as needed   Chronic kidney disease, stage 3b (HCC):  Slightly worsening than baseline.   Recent baseline creatinine 1.0-1.4.   Monitor renal indices   Hypomagnesemia:  -Repleted magnesium   Type II diabetes mellitus with renal manifestations [facility]): Recent A1c 5.9, well-controlled.  Patient is taking farxiga at home -Sliding scale insulin   Depression with anxiety -Continue home medications   Obesity (BMI 30-39.9): Body weight 111.9 kg, BMI 35.39 -Encourage losing weight -Exercise and healthy diet  DVT prophylaxis: Eliquis Code Status: Full Family Communication: None Disposition Plan: Status is: Inpatient Remains inpatient appropriate because: Unsafe discharge plan   Level of care: Telemetry Medical  Consultants:  Neurology Oncology  Procedures:  None  Antimicrobials: None   Subjective: Patient seen and examined.  Resting comfortably in bed.  No visible distress.  No complaints of pain.  Objective: Vitals:   11/25/22 2023 11/26/22 0024 11/26/22 0423 11/26/22 0744  BP: 137/82 [name] Kitchen)  153/84 (!) 163/95 (!) 147/94  Pulse: 86 85 76 91  Resp:    18  Temp: 98.2 F (36.8 C) 98.1 F (36.7 C) 97.8 F (36.6 C) (!) 97.5 F (36.4 C)  TempSrc: Oral  Oral Oral Oral  SpO2: 100% 96% 97% 95%  Weight:      Height:        Intake/Output Summary (Last 24 hours) at 11/26/2022 1311 Last data filed at 11/25/2022 1910 Gross per 24 hour  Intake 960 ml  Output --  Net 960 ml    Filed Weights   11/22/22 0100  Weight: 111 kg    Examination:  General exam: No acute distress Respiratory system: Clear lungs.  Normal respiratory effort.  Room air Cardiovascular system: S1-S2, RRR, no murmurs, no pedal edema Gastrointestinal system: Soft NT/ND, normal bowel sounds Central nervous system: Alert and oriented. No focal neurological deficits. Extremities: Decreased power LUE, LLE.  Decreased grip strength.  Decreased sensation.  Symptoms improving over interval Skin: Chronic appearing erythema on bilateral shins Psychiatry: Judgement and insight appear normal. Mood & affect appropriate.     Data Reviewed: I have personally reviewed following labs and imaging studies  CBC: Recent Labs  Lab 11/21/22 1450 11/23/22 0822 11/24/22 1019  WBC 6.4 6.4 7.1  NEUTROABS 4.5 4.1 5.0  HGB 10.4* 9.3* 9.6*  HCT 31.9* 29.0* 30.6*  MCV 82.9 83.3 84.5  PLT 248 215 225   Basic Metabolic Panel: Recent Labs  Lab 11/21/22 1450 11/23/22 0822  NA 135 136  K 3.6 3.8  CL 103 101  CO2 27 26  GLUCOSE 157* 113*  BUN 41* 26*  CREATININE 1.51* 1.29*  CALCIUM 8.3* 8.2*  MG 1.6*  --   PHOS 2.9  --    GFR: Estimated Creatinine Clearance: 83.6 mL/min (A) (by C-G formula based on SCr of 1.29 mg/dL (H)). Liver Function Tests: Recent Labs  Lab 11/21/22 1450  AST 16  ALT 16  ALKPHOS 88  BILITOT 0.9  PROT 7.2  ALBUMIN 3.3*   No results for input(s): "LIPASE", "AMYLASE" in the last 168 hours. No results for input(s): "AMMONIA" in the last 168 hours. Coagulation Profile: No results for input(s): "INR", "PROTIME" in the last 168 hours. Cardiac Enzymes: No results for input(s): "CKTOTAL", "CKMB", "CKMBINDEX", "TROPONINI" in the last 168 hours. BNP  (last 3 results) Recent Labs    11/28/21 1520  PROBNP 966*   HbA1C: No results for input(s): "HGBA1C" in the last 72 hours.  CBG: Recent Labs  Lab 11/25/22 1200 11/25/22 1710 11/25/22 2146 11/26/22 0744 11/26/22 1150  GLUCAP 174* 188* 126* 102* 168*   Lipid Profile: No results for input(s): "CHOL", "HDL", "LDLCALC", "TRIG", "CHOLHDL", "LDLDIRECT" in the last 72 hours.  Thyroid Function Tests: No results for input(s): "TSH", "T4TOTAL", "FREET4", "T3FREE", "THYROIDAB" in the last 72 hours. Anemia Panel: No results for input(s): "VITAMINB12", "FOLATE", "FERRITIN", "TIBC", "IRON", "RETICCTPCT" in the last 72 hours. Sepsis Labs: No results for input(s): "PROCALCITON", "LATICACIDVEN" in the last 168 hours.  No results found for this or any previous visit (from the past 240 hour(s)).       Radiology Studies: No results found.      Scheduled Meds:  apixaban  2.5 mg Oral BID   atorvastatin  40 mg Oral q1800   busPIRone  7.5 mg Oral BID   gabapentin  100 mg Oral BID   insulin aspart  0-5 Units Subcutaneous QHS   insulin aspart  0-9 Units Subcutaneous TID WC  lamoTRIgine  25 mg Oral QHS   pantoprazole  40 mg Oral BID AC   sertraline  50 mg Oral Daily   Continuous Infusions:   LOS: 4 days      Tresa Moore, MD [facility]   If 7PM-7AM, please contact night-coverage  11/26/2022, 1:11 PM

## 2022-11-27 ENCOUNTER — Encounter (HOSPITAL_COMMUNITY): Payer: Self-pay | Admitting: Physical Medicine & Rehabilitation

## 2022-11-27 ENCOUNTER — Ambulatory Visit: Payer: Medicaid Other

## 2022-11-27 ENCOUNTER — Other Ambulatory Visit: Payer: Self-pay

## 2022-11-27 ENCOUNTER — Inpatient Hospital Stay (HOSPITAL_COMMUNITY)
Admission: RE | Admit: 2022-11-27 | Discharge: 2022-12-07 | DRG: 057 | Disposition: A | Discharge status: Home / Self Care | Payer: Medicaid Other | Source: Other Acute Inpatient Hospital | Attending: Physical Medicine & Rehabilitation | Admitting: Physical Medicine & Rehabilitation

## 2022-11-27 ENCOUNTER — Ambulatory Visit: Payer: Medicaid Other | Admitting: Physical Therapy

## 2022-11-27 ENCOUNTER — Other Ambulatory Visit: Payer: Self-pay | Admitting: Oncology

## 2022-11-27 DIAGNOSIS — Z91048 Other nonmedicinal substance allergy status: Secondary | ICD-10-CM | POA: Diagnosis not present

## 2022-11-27 DIAGNOSIS — E669 Obesity, unspecified: Secondary | ICD-10-CM | POA: Diagnosis present

## 2022-11-27 DIAGNOSIS — I10 Essential (primary) hypertension: Secondary | ICD-10-CM | POA: Diagnosis not present

## 2022-11-27 DIAGNOSIS — R739 Hyperglycemia, unspecified: Secondary | ICD-10-CM | POA: Diagnosis not present

## 2022-11-27 DIAGNOSIS — I5042 Chronic combined systolic (congestive) and diastolic (congestive) heart failure: Secondary | ICD-10-CM | POA: Diagnosis not present

## 2022-11-27 DIAGNOSIS — E7800 Pure hypercholesterolemia, unspecified: Secondary | ICD-10-CM | POA: Diagnosis not present

## 2022-11-27 DIAGNOSIS — H548 Legal blindness, as defined in USA: Secondary | ICD-10-CM | POA: Diagnosis not present

## 2022-11-27 DIAGNOSIS — Z6835 Body mass index (BMI) 35.0-35.9, adult: Secondary | ICD-10-CM | POA: Diagnosis not present

## 2022-11-27 DIAGNOSIS — Z833 Family history of diabetes mellitus: Secondary | ICD-10-CM | POA: Diagnosis not present

## 2022-11-27 DIAGNOSIS — Z8249 Family history of ischemic heart disease and other diseases of the circulatory system: Secondary | ICD-10-CM

## 2022-11-27 DIAGNOSIS — I639 Cerebral infarction, unspecified: Secondary | ICD-10-CM | POA: Diagnosis present

## 2022-11-27 DIAGNOSIS — Z822 Family history of deafness and hearing loss: Secondary | ICD-10-CM | POA: Diagnosis not present

## 2022-11-27 DIAGNOSIS — I480 Paroxysmal atrial fibrillation: Secondary | ICD-10-CM | POA: Diagnosis not present

## 2022-11-27 DIAGNOSIS — Z83438 Family history of other disorder of lipoprotein metabolism and other lipidemia: Secondary | ICD-10-CM

## 2022-11-27 DIAGNOSIS — E785 Hyperlipidemia, unspecified: Secondary | ICD-10-CM | POA: Diagnosis not present

## 2022-11-27 DIAGNOSIS — G8929 Other chronic pain: Secondary | ICD-10-CM | POA: Diagnosis present

## 2022-11-27 DIAGNOSIS — Z7901 Long term (current) use of anticoagulants: Secondary | ICD-10-CM | POA: Diagnosis not present

## 2022-11-27 DIAGNOSIS — Z882 Allergy status to sulfonamides status: Secondary | ICD-10-CM

## 2022-11-27 DIAGNOSIS — E1122 Type 2 diabetes mellitus with diabetic chronic kidney disease: Secondary | ICD-10-CM | POA: Diagnosis not present

## 2022-11-27 DIAGNOSIS — N179 Acute kidney failure, unspecified: Secondary | ICD-10-CM | POA: Diagnosis present

## 2022-11-27 DIAGNOSIS — C140 Malignant neoplasm of pharynx, unspecified: Secondary | ICD-10-CM | POA: Diagnosis present

## 2022-11-27 DIAGNOSIS — E1169 Type 2 diabetes mellitus with other specified complication: Secondary | ICD-10-CM | POA: Diagnosis not present

## 2022-11-27 DIAGNOSIS — Z823 Family history of stroke: Secondary | ICD-10-CM | POA: Diagnosis not present

## 2022-11-27 DIAGNOSIS — E66811 Obesity, class 1: Secondary | ICD-10-CM

## 2022-11-27 DIAGNOSIS — F419 Anxiety disorder, unspecified: Secondary | ICD-10-CM | POA: Diagnosis present

## 2022-11-27 DIAGNOSIS — Z79899 Other long term (current) drug therapy: Secondary | ICD-10-CM | POA: Diagnosis not present

## 2022-11-27 DIAGNOSIS — I69354 Hemiplegia and hemiparesis following cerebral infarction affecting left non-dominant side: Secondary | ICD-10-CM | POA: Diagnosis not present

## 2022-11-27 DIAGNOSIS — I130 Hypertensive heart and chronic kidney disease with heart failure and stage 1 through stage 4 chronic kidney disease, or unspecified chronic kidney disease: Secondary | ICD-10-CM | POA: Diagnosis not present

## 2022-11-27 DIAGNOSIS — G4733 Obstructive sleep apnea (adult) (pediatric): Secondary | ICD-10-CM | POA: Diagnosis present

## 2022-11-27 DIAGNOSIS — C160 Malignant neoplasm of cardia: Secondary | ICD-10-CM

## 2022-11-27 DIAGNOSIS — N1832 Chronic kidney disease, stage 3b: Secondary | ICD-10-CM | POA: Diagnosis not present

## 2022-11-27 DIAGNOSIS — E119 Type 2 diabetes mellitus without complications: Secondary | ICD-10-CM

## 2022-11-27 LAB — CBC WITH DIFFERENTIAL/PLATELET
Abs Immature Granulocytes: 0.02 10*3/uL (ref 0.00–0.07)
Basophils Absolute: 0 10*3/uL (ref 0.0–0.1)
Basophils Relative: 0 %
Eosinophils Absolute: 0.4 10*3/uL (ref 0.0–0.5)
Eosinophils Relative: 6 %
HCT: 29.9 % — ABNORMAL LOW (ref 39.0–52.0)
Hemoglobin: 9.5 g/dL — ABNORMAL LOW (ref 13.0–17.0)
Immature Granulocytes: 0 %
Lymphocytes Relative: 21 %
Lymphs Abs: 1.3 10*3/uL (ref 0.7–4.0)
MCH: 26.1 pg (ref 26.0–34.0)
MCHC: 31.8 g/dL (ref 30.0–36.0)
MCV: 82.1 fL (ref 80.0–100.0)
Monocytes Absolute: 0.9 10*3/uL (ref 0.1–1.0)
Monocytes Relative: 15 %
Neutro Abs: 3.6 10*3/uL (ref 1.7–7.7)
Neutrophils Relative %: 58 %
Platelets: 314 10*3/uL (ref 150–400)
RBC: 3.64 MIL/uL — ABNORMAL LOW (ref 4.22–5.81)
RDW: 18.7 % — ABNORMAL HIGH (ref 11.5–15.5)
WBC: 6.3 10*3/uL (ref 4.0–10.5)
nRBC: 0 % (ref 0.0–0.2)

## 2022-11-27 LAB — GLUCOSE, CAPILLARY
Glucose-Capillary: 193 mg/dL — ABNORMAL HIGH (ref 70–99)
Glucose-Capillary: 133 mg/dL — ABNORMAL HIGH (ref 70–99)
Glucose-Capillary: 149 mg/dL — ABNORMAL HIGH (ref 70–99)

## 2022-11-27 LAB — BASIC METABOLIC PANEL
Anion gap: 11 (ref 5–15)
BUN: 31 mg/dL — ABNORMAL HIGH (ref 6–20)
CO2: 25 mmol/L (ref 22–32)
Calcium: 8.4 mg/dL — ABNORMAL LOW (ref 8.9–10.3)
Chloride: 102 mmol/L (ref 98–111)
Creatinine, Ser: 1.37 mg/dL — ABNORMAL HIGH (ref 0.61–1.24)
GFR, Estimated: >60 mL/min (ref >60)
Glucose, Bld: 106 mg/dL — ABNORMAL HIGH (ref 70–99)
Potassium: 4.2 mmol/L (ref 3.5–5.1)
Sodium: 138 mmol/L (ref 135–145)

## 2022-11-27 MED ORDER — SENNOSIDES-DOCUSATE SODIUM 8.6-50 MG PO TABS: 1.0000 | ORAL_TABLET | Freq: Every evening | ORAL | Status: DC | PRN

## 2022-11-27 MED ORDER — LAMOTRIGINE 100 MG PO TABS: 50.0000 mg | ORAL_TABLET | Freq: Every day | ORAL | Status: DC

## 2022-11-27 MED ORDER — LAMOTRIGINE 25 MG PO TABS: 25.0000 mg | ORAL_TABLET | Freq: Every day | ORAL | Status: DC

## 2022-11-27 MED ORDER — ACETAMINOPHEN 650 MG RE SUPP: 650.0000 mg | RECTAL | Status: DC | PRN

## 2022-11-27 MED ORDER — APIXABAN 2.5 MG PO TABS: 2.5000 mg | ORAL_TABLET | Freq: Two times a day (BID) | ORAL | Status: DC

## 2022-11-27 MED ORDER — LAMOTRIGINE 25 MG PO TABS
25.0000 mg | ORAL_TABLET | Freq: Every day | ORAL | Status: DC
  Filled 2022-11-27: qty 1

## 2022-11-27 MED ORDER — APIXABAN 2.5 MG PO TABS
2.5000 mg | ORAL_TABLET | Freq: Two times a day (BID) | ORAL | Status: DC
  Administered 2022-11-27 – 2022-12-07 (×20): 2.5 mg via ORAL
  Filled 2022-11-27 (×20): qty 1

## 2022-11-27 MED ORDER — LAMOTRIGINE 25 MG PO TABS: 75.0000 mg | ORAL_TABLET | Freq: Every day | ORAL | Status: DC

## 2022-11-27 MED ORDER — ACETAMINOPHEN 325 MG PO TABS: 650.0000 mg | ORAL_TABLET | ORAL | Status: DC | PRN

## 2022-11-27 MED ORDER — GABAPENTIN 100 MG PO CAPS: 100.0000 mg | ORAL_CAPSULE | Freq: Two times a day (BID) | ORAL | Status: DC

## 2022-11-27 MED ORDER — INSULIN ASPART 100 UNIT/ML IJ SOLN
0.0000 [IU] | Freq: Three times a day (TID) | INTRAMUSCULAR | Status: DC
  Administered 2022-11-27 – 2022-11-28 (×2): 1 [IU] via SUBCUTANEOUS
  Administered 2022-11-29 – 2022-12-01 (×2): 2 [IU] via SUBCUTANEOUS
  Administered 2022-12-02: 1 [IU] via SUBCUTANEOUS
  Administered 2022-12-03: 2 [IU] via SUBCUTANEOUS

## 2022-11-27 MED ORDER — ATORVASTATIN CALCIUM 40 MG PO TABS
40.0000 mg | ORAL_TABLET | Freq: Every day | ORAL | Status: DC
  Administered 2022-11-27 – 2022-12-06 (×10): 40 mg via ORAL
  Filled 2022-11-27 (×11): qty 1

## 2022-11-27 MED ORDER — SERTRALINE HCL 50 MG PO TABS
50.0000 mg | ORAL_TABLET | Freq: Every day | ORAL | Status: DC
  Administered 2022-11-28 – 2022-12-07 (×10): 50 mg via ORAL
  Filled 2022-11-27 (×10): qty 1

## 2022-11-27 MED ORDER — DAPAGLIFLOZIN PROPANEDIOL 10 MG PO TABS
10.0000 mg | ORAL_TABLET | Freq: Every day | ORAL | Status: DC
  Administered 2022-11-27 – 2022-12-07 (×11): 10 mg via ORAL
  Filled 2022-11-27 (×11): qty 1

## 2022-11-27 MED ORDER — TORSEMIDE 20 MG PO TABS: 40.0000 mg | ORAL_TABLET | Freq: Every day | ORAL | Status: DC | PRN

## 2022-11-27 MED ORDER — ACETAMINOPHEN 160 MG/5ML PO SOLN: 650.0000 mg | ORAL | Status: DC | PRN

## 2022-11-27 MED ORDER — LAMOTRIGINE 25 MG PO TABS: 125.0000 mg | ORAL_TABLET | Freq: Every day | ORAL | Status: DC

## 2022-11-27 MED ORDER — LAMOTRIGINE 100 MG PO TABS: 100.0000 mg | ORAL_TABLET | Freq: Every day | ORAL | Status: DC

## 2022-11-27 MED ORDER — PANTOPRAZOLE SODIUM 40 MG PO TBEC
40.0000 mg | DELAYED_RELEASE_TABLET | Freq: Two times a day (BID) | ORAL | Status: DC
  Administered 2022-11-27 – 2022-12-07 (×20): 40 mg via ORAL
  Filled 2022-11-27 (×20): qty 1

## 2022-11-27 MED ORDER — OXYCODONE HCL 5 MG PO TABS
5.0000 mg | ORAL_TABLET | ORAL | Status: DC | PRN
  Administered 2022-11-27 – 2022-11-28 (×3): 10 mg via ORAL
  Administered 2022-11-29: 5 mg via ORAL
  Administered 2022-11-29 – 2022-12-07 (×10): 10 mg via ORAL
  Filled 2022-11-27 (×12): qty 2
  Filled 2022-11-27: qty 1
  Filled 2022-11-27 (×2): qty 2

## 2022-11-27 MED ORDER — LAMOTRIGINE 25 MG PO TABS
25.0000 mg | ORAL_TABLET | Freq: Two times a day (BID) | ORAL | Status: DC
  Administered 2022-12-06 – 2022-12-07 (×3): 25 mg via ORAL
  Filled 2022-11-27 (×3): qty 1

## 2022-11-27 MED ORDER — LAMOTRIGINE 25 MG PO TABS
25.0000 mg | ORAL_TABLET | Freq: Every day | ORAL | Status: AC
  Administered 2022-11-27 – 2022-12-04 (×8): 25 mg via ORAL
  Filled 2022-11-27 (×8): qty 1

## 2022-11-27 MED ORDER — BUSPIRONE HCL 15 MG PO TABS
7.5000 mg | ORAL_TABLET | Freq: Two times a day (BID) | ORAL | Status: DC
  Administered 2022-11-27 – 2022-12-07 (×20): 7.5 mg via ORAL
  Filled 2022-11-27 (×20): qty 1

## 2022-11-27 MED ORDER — GABAPENTIN 100 MG PO CAPS
200.0000 mg | ORAL_CAPSULE | Freq: Two times a day (BID) | ORAL | Status: DC
  Administered 2022-11-27 – 2022-12-07 (×20): 200 mg via ORAL
  Filled 2022-11-27 (×21): qty 2

## 2022-11-27 NOTE — Plan of Care (Signed)
  Problem: Consults Goal: RH STROKE PATIENT EDUCATION Description: See Patient Education module for education specifics  Outcome: Progressing   Problem: RH BOWEL ELIMINATION Goal: RH STG MANAGE BOWEL WITH ASSISTANCE Description: STG Manage Bowel with min Assistance. Outcome: Progressing Goal: RH STG MANAGE BOWEL W/MEDICATION W/ASSISTANCE Description: STG Manage Bowel with Medication with min Assistance. Outcome: Progressing   Problem: RH BLADDER ELIMINATION Goal: RH STG MANAGE BLADDER WITH ASSISTANCE Description: STG Manage Bladder With min Assistance Outcome: Progressing   Problem: RH SKIN INTEGRITY Goal: RH STG SKIN FREE OF INFECTION/BREAKDOWN Description: Incision will improve and be free of infection/breakdown with min assist  Outcome: Progressing Goal: RH STG ABLE TO PERFORM INCISION/WOUND CARE W/ASSISTANCE Description: STG Able To Perform Incision/Wound Care With min Assistance. Outcome: Progressing   Problem: RH SAFETY Goal: RH STG ADHERE TO SAFETY PRECAUTIONS W/ASSISTANCE/DEVICE Description: STG Adhere to Safety Precautions With min Assistance/Device. Outcome: Progressing Goal: RH STG DECREASED RISK OF FALL WITH ASSISTANCE Description: STG Decreased Risk of Fall With cueing Assistance. Outcome: Progressing   Problem: RH PAIN MANAGEMENT Goal: RH STG PAIN MANAGED AT OR BELOW PT'S PAIN GOAL Description: Pain will be managed at 4 out of 10 on pain scale with PRN medications min assist  Outcome: Progressing   Problem: RH KNOWLEDGE DEFICIT Goal: RH STG INCREASE KNOWLEDGE OF HYPERTENSION Description: Patient/caregiver will be able to manage medications diet/lifestyle modifications to improve HTN from nursing education and nursing handouts independently  Outcome: Progressing Goal: RH STG INCREASE KNOWLEGDE OF HYPERLIPIDEMIA Description: Patient/caregiver will be able to manage cholesterol medications and diet/lifestyle modifications to improve cholesterol levels from  nursing educations and nursing handouts independently  Outcome: Progressing Goal: RH STG INCREASE KNOWLEDGE OF STROKE PROPHYLAXIS Description: Patient/caregiver will be able to manage Eliquis to assist as stroke and Afib prevention from nursing education and nursing handouts independently   Outcome: Progressing   Problem: Education: Goal: Knowledge of disease or condition will improve Outcome: Progressing Goal: Knowledge of secondary prevention will improve (MUST DOCUMENT ALL) Outcome: Progressing Goal: Knowledge of patient specific risk factors will improve Loraine Leriche N/A or DELETE if not current risk factor) Outcome: Progressing   Problem: Ischemic Stroke/TIA Tissue Perfusion: Goal: Complications of ischemic stroke/TIA will be minimized Outcome: Progressing   Problem: Coping: Goal: Will verbalize positive feelings about self Outcome: Progressing Goal: Will identify appropriate support needs Outcome: Progressing   Problem: Health Behavior/Discharge Planning: Goal: Ability to manage health-related needs will improve Outcome: Progressing Goal: Goals will be collaboratively established with patient/family Outcome: Progressing   Problem: Self-Care: Goal: Ability to participate in self-care as condition permits will improve Outcome: Progressing Goal: Verbalization of feelings and concerns over difficulty with self-care will improve Outcome: Progressing Goal: Ability to communicate needs accurately will improve Outcome: Progressing   Problem: Nutrition: Goal: Risk of aspiration will decrease Outcome: Progressing Goal: Dietary intake will improve Outcome: Progressing

## 2022-11-27 NOTE — Progress Notes (Signed)
Patient arrived on unit via EMS, transferred self with walker and one assist to bed.  Arrived in no distress

## 2022-11-27 NOTE — Progress Notes (Addendum)
Inpatient Rehabilitation Admission Medication Review by a Pharmacist  A complete drug regimen review was completed for this patient to identify any potential clinically significant medication issues.  High Risk Drug Classes Is patient taking? Indication by Medication  Antipsychotic No   Anticoagulant Yes Apixaban for Afib   Antibiotic No   Opioid Yes Oxycodone for acute pain  Antiplatelet No   Hypoglycemics/insulin Yes Insulin for DM2  Vasoactive Medication No   Chemotherapy No   Other Yes Atorvastatin for HLD  Buspirone for anxiety Gabapentin for nerve pain Lamotrigine for Left hand hyperesthesia vs central pain syndrome vs neuropathy  Pantoprazole for GERD Sertraline for depression Acetaminophen for mild pain Senna-doc for constipation Torsemide for edema      Type of Medication Issue Identified Description of Issue Recommendation(s)  Drug Interaction(s) (clinically significant)     Duplicate Therapy     Allergy     No Medication Administration End Date     Incorrect Dose     Additional Drug Therapy Needed  Patient was not taking dapagliflozin due to cost. PA is now approved per 11/12/22 note Restart dapagliflozin, ok per PA   Significant med changes from prior encounter (inform family/care partners about these prior to discharge). Held home dexamethasone, prochlorperazine, ondansetron  Restart as appropriate   Other  Lamotrigine started 8/23 for Left hand hyperesthesia vs central pain syndrome vs neuropathy per neurology. Titration schedule per Neurology 8/23 below  Consider entering lamotrigine taper per Neurology recommendation - ok to enter per PA     Lamotrigine titration  Morning  Night  Week 1 and 2 -8/23-9/5   none   25 mg   Week 3 and 4 -9/6- 9/19   25 mg    25 mg  Week 5- 9/20- 9/26   25 mg   50 mg  Week 6- 9/27- 10/3   50 mg   75 mg  Week 7- 10/4- 10/10   75 mg 100 mg  Week 8- 10/11   100 mg 125 mg     Clinically significant medication issues were  identified that warrant physician communication and completion of prescribed/recommended actions by midnight of the next day:  Yes  Name of provider notified for urgent issues identified: Deatra Ina  Provider Method of Notification: Secure chat    Pharmacist comments: Clinically significant medication issue  was resolved per above. Orders entered.   Time spent performing this drug regimen review (minutes):  30  Alphia Moh, PharmD, [place], [facility] Clinical Pharmacist  Please check AMION for all [facility] phone numbers After 10:00 PM, call Main Pharmacy [phone]

## 2022-11-27 NOTE — H&P (Addendum)
Physical Medicine and Rehabilitation Admission H&P    Chief Complaint  Patient presents with   Numbness      States he has been having L side arm and leg numbness since 930 am. CVA HX 3 yrs ago. Denies injury. 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  Per chart review patient lives with his sister.  Two-level home with bed and bath upstairs.  Modified independent with rolling walker outside the home, furniture cruises within the house.  Patient does not drive.  Presented to [facility]/21/2022 with left-sided numbness and increasing weakness.  Initial cranial CT scan showed compared to prior exam no new cytotoxic edema in the left frontal lobe with associated blood products.  No definite evidence of parenchymal hematoma.  MRI showed subacute infarct left frontal operculum with petechial hemorrhage and mild surrounding edema.  No significant mass effect or midline shift.  MRA identified no large vessel occlusion or stenosis.  Admission chemistries unremarkable except glucose 157 BUN 41 creatinine 1.51, hemoglobin 10.4, BNP 239, hemoglobin A1c 7.0.  Echocardiogram with ejection fraction of 55 to 60% no wall motion abnormalities.  Patient did not receive TNK.  Neurology service follow-up he was cleared to begin Eliquis for CVA prophylaxis as well as history of atrial  fibrillation.  Hospital course follow-up oncology services Dr.Yu for GE junction carcinoma and chemotherapy currently on hold follow-up outpatient.  Patient reports chronic pain in his lower extremities left greater than right.  He has chronically limited vision in his left eye.  Patient tolerating a regular consistency diet.  Therapy evaluations completed due to patient's decreased functional mobility increasing left-sided weakness and numbness was admitted for a comprehensive rehab program.  Review of Systems  Constitutional:  Negative for chills and fever.  HENT:  Negative for hearing loss.   Eyes:        Legally blind of the left eye and decreased vision on the right.  Respiratory:  Negative for cough.        Increased shortness of breath with exertion  Cardiovascular:  Positive for palpitations and leg swelling. Negative for chest pain.  Gastrointestinal:  Positive for constipation. Negative for heartburn, nausea and vomiting.  Genitourinary:  Positive for urgency. Negative for dysuria, flank pain and hematuria.  Musculoskeletal:  Positive for joint pain.  Skin:  Negative for rash.  Neurological:  Positive for weakness.  Psychiatric/Behavioral:  Positive for depression.        Anxiety  All other systems reviewed and are negative.  Past Medical History:  Diagnosis Date   Acute ischemic stroke (HCC) 09/06/2017   Acute renal failure superimposed on stage 3a chronic kidney disease (HCC) 07/08/2019   Acute right-sided weakness    Allergies    Anasarca    Anemia 07/10/2017   Arthritis    Cancer (HCC)    stomach and esophageal cancer stage 4   CHF (congestive heart failure) (HCC)    "coinsided w/kidney problems I was  having 06/2017"   Chicken pox    CKD (chronic kidney disease) stage 3, GFR 30-59 ml/min (HCC) 06/2017   Depression    Elevated troponin 07/08/2019   High cholesterol    History of cardiomyopathy    LVEF 40 to 45% in April 2019 - subsequently normalized    Hyperbilirubinemia 07/10/2017   Hypertension    Ischemic stroke (HCC)    Small left internal capsule infarct due to lacunar disease   Morbid obesity (HCC)    Normocytic anemia 12/16/2017   Recurrent incisional hernia with incarceration s/p repair 10/22/2017 10/21/2017   Stroke (HCC) 08/2017   "right sided weakness since; getting stronger though" (10/22/2017)   Type 2 diabetes mellitus [facility])    Past Surgical History:  Procedure Laterality Date   ABDOMINAL HERNIA REPAIR  2008; 10/22/2017   "scope; OPEN REPAIR INCARCERATED VENTRAL HERNIA   ESOPHAGOGASTRODUODENOSCOPY (EGD) WITH PROPOFOL N/A 09/10/2022   Procedure: ESOPHAGOGASTRODUODENOSCOPY (EGD) WITH PROPOFOL;  Surgeon: Toney Reil, MD;  Location: [facility] ENDOSCOPY;  Service: Gastroenterology;  Laterality: N/A;   ESOPHAGOGASTRODUODENOSCOPY (EGD) WITH PROPOFOL N/A 09/28/2022   Procedure: ESOPHAGOGASTRODUODENOSCOPY (EGD) WITH PROPOFOL;  Surgeon: Jaynie Collins, DO;  Location: [facility] ENDOSCOPY;  Service: Gastroenterology;  Laterality: N/A;   HEMOSTASIS CONTROL  09/28/2022   Procedure: HEMOSTASIS CONTROL;  Surgeon: Jaynie Collins, DO;  Location: [facility] ENDOSCOPY;  Service: Gastroenterology;;   HERNIA REPAIR     KNEE ARTHROSCOPY Right 1989   PORTA CATH INSERTION N/A 09/25/2022   Procedure: PORTA CATH INSERTION;  Surgeon: Annice Needy, MD;  Location: [facility] INVASIVE CV LAB;  Service: Cardiovascular;  Laterality: N/A;   VENTRAL HERNIA REPAIR N/A 10/22/2017   Procedure: OPEN REPAIR INCARCERATED VENTRAL HERNIA;  Surgeon: Glenna Fellows, MD;  Location: [facility] OR;  Service: General;  Laterality: N/A;   Family History  Problem Relation Age of Onset   Diabetes Mother    Stroke Mother    Arthritis Mother    Depression Mother    Heart disease Mother    Hypertension Mother    Learning disabilities Mother    Mental illness Mother    Sleep apnea Mother    Diabetes Father    Heart disease Father    Arthritis Father    Hearing loss Father     Hyperlipidemia Father    Heart attack Father    Hypertension Father    Stroke Father    Diabetes Sister    Depression Sister    Diabetes Sister    Hypertension Sister    Mental illness Sister    Sleep apnea Sister    Diabetes Maternal Grandmother    Heart disease Maternal Grandmother    Depression Maternal Grandmother    Hyperlipidemia Maternal Grandmother    Hypertension Maternal Grandmother    Stroke Maternal Grandmother    Hypertension Maternal Grandfather    Hyperlipidemia Maternal Grandfather    Stroke Maternal Grandfather    Arthritis Paternal Grandmother    Hearing loss Paternal Grandmother    Stroke Paternal Grandfather    Heart disease Paternal Grandfather    Arthritis Paternal Grandfather    Heart attack Paternal Grandfather    Melanoma Other    Social History:  reports that he has never smoked. He has never used smokeless tobacco. He reports current alcohol use. He reports that he does not use drugs. Allergies:  Allergies  Allergen Reactions   Pollen Extract Other (See Comments)   Sulfa Antibiotics    Medications Prior to Admission  Medication Sig Dispense Refill  apixaban (ELIQUIS) 2.5 MG TABS tablet Take 1 tablet (2.5 mg total) by mouth 2 (two) times daily.     atorvastatin (LIPITOR) 40 MG tablet Take 1 tablet (40 mg total) by mouth daily at 6 PM. 90 tablet 3   busPIRone (BUSPAR) 7.5 MG tablet Take 1 tablet (7.5 mg total) by mouth 2 (two) times daily. 180 tablet 1   Continuous Glucose Sensor (FREESTYLE LIBRE 2 SENSOR) MISC Place 1 sensor on the skin every 14 days. Use to check glucose continuously 2 each 11   dexamethasone (DECADRON) 4 MG tablet Take 2 tablets (8 mg total) by mouth daily. Start the day after chemotherapy for 2 days. Take with food. 30 tablet 1   gabapentin (NEURONTIN) 100 MG capsule Take 1 capsule (100 mg total) by mouth 2 (two) times daily. 60 capsule 0   lamoTRIgine (LAMICTAL) 25 MG tablet Take 1 tablet (25 mg total) by mouth at bedtime.      ondansetron (ZOFRAN-ODT) 8 MG disintegrating tablet Take 1 tablet (8 mg total) by mouth every 8 (eight) hours as needed for nausea or vomiting. 60 tablet 1   oxyCODONE (OXY IR/ROXICODONE) 5 MG immediate release tablet Take 5 mg by mouth every 4 (four) hours as needed for severe pain.     pantoprazole (PROTONIX) 40 MG tablet Take 1 tablet (40 mg total) by mouth 2 (two) times daily before a meal. 60 tablet 3   prochlorperazine (COMPAZINE) 10 MG tablet Take 1 tablet (10 mg total) by mouth every 6 (six) hours as needed for nausea or vomiting. 60 tablet 0   scopolamine (TRANSDERM-SCOP) 1 MG/3DAYS Place 1 patch (1.5 mg total) onto the skin every 3 (three) days. 4 patch 0   sertraline (ZOLOFT) 50 MG tablet Take 1 tablet (50 mg total) by mouth daily. 30 tablet 3   torsemide (DEMADEX) 20 MG tablet Take 2 tablets (40 mg total) by mouth daily as needed (fluid/edema). 90 tablet 3        Home: Home Living Family/patient expects to be discharged to:: Private residence Living Arrangements: Other relatives (sister) Available Help at Discharge: Family, Available 24 hours/day Type of Home: Other(Comment) (townhouse) Home Access: Level entry Home Layout: 1/2 bath on main level, Two level Alternate Level Stairs-Number of Steps: flight Alternate Level Stairs-Rails: Right Bathroom Shower/Tub: Health visitor: Standard Bathroom Accessibility: Yes Home Equipment: Shower seat - built in, Agricultural consultant (2 wheels) Additional Comments: reports can sleep in recliner on 1st floor if needed; sister works outside of the home  Lives With: Other (Comment) (sister)   Functional History: Prior Function Prior Level of Function : Independent/Modified Independent Mobility Comments: MOD I with RW outside of the home, furniture-cruises within the home; denies recent fall history ADLs Comments: sister assists with IADLs, .does not drive   Functional Status:  Mobility: Bed Mobility Overal bed mobility:  Needs Assistance Bed Mobility: Supine to Sit Supine to sit: Contact guard Sit to supine: HOB elevated, Used rails, Mod assist General bed mobility comments: NT in recliner pre/post session Transfers Overall transfer level: Needs assistance Equipment used: Rolling walker (2 wheels) Transfers: Sit to/from Stand Sit to Stand: Min assist Bed to/from chair/wheelchair/BSC transfer type:: Stand pivot Stand pivot transfers: Min assist General transfer comment: frequent multi modal cues for RW techinque Ambulation/Gait Ambulation/Gait assistance: Min assist Gait Distance (Feet): 75 Feet Assistive device: Rolling walker (2 wheels) Gait Pattern/deviations: Step-through pattern, Decreased step length - right, Decreased step length - left General Gait Details: distance is progressing daily  with increased confidence and improving balance but gait remais slow and cautious Gait velocity: decreased   ADL: ADL Overall ADL's : Needs assistance/impaired Eating/Feeding: Minimal assistance, Sitting Eating/Feeding Details (indicate cue type and reason): open package/containers, optimal positioning of tray due to visual defciits Grooming: Wash/dry face, Sitting, Supervision/safety Grooming Details (indicate cue type and reason): anticipated Upper Body Dressing : Set up, Sitting Upper Body Dressing Details (indicate cue type and reason): doff/down gown Lower Body Dressing: Maximal assistance Lower Body Dressing Details (indicate cue type and reason): doff socks Toilet Transfer: Minimal assistance, Rolling walker (2 wheels), Ambulation Toilet Transfer Details (indicate cue type and reason): simulated with RW in room Toileting- Clothing Manipulation and Hygiene: Minimal assistance, Sitting/lateral lean Functional mobility during ADLs: Minimal assistance, Rolling walker (2 wheels) (approx 6' in room with RW, frequent vcs for technique/safety) General ADL Comments: further mobility limited on this date due to  reported pain in L calf, RN in room and pt assisted to supine position   Cognition: Cognition Overall Cognitive Status: No family/caregiver present to determine baseline cognitive functioning Arousal/Alertness: Awake/alert Orientation Level: Oriented X4 Cognition Arousal: Alert Behavior During Therapy: Impulsive, WFL for tasks assessed/performed Overall Cognitive Status: No family/caregiver present to determine baseline cognitive functioning Area of Impairment: Orientation Current Attention Level: Sustained Following Commands: Follows one step commands with increased time, Follows multi-step commands inconsistently Safety/Judgement: Decreased awareness of deficits Awareness: Emergent Problem Solving: Slow processing, Requires verbal cues, Requires tactile cues General Comments: pt with ?higher level cognitive deficits, will continue to assess  Physical Exam: Blood pressure (!) 151/86, pulse 79, temperature 98 F (36.7 C), temperature source Oral, resp. rate 18, height 5\' 11"  (1.803 m), weight 109.8 kg, SpO2 99%.   General: No apparent distress, HEENT: Head is normocephalic, atraumatic, oral mucosa pink and moist, wearing glasses Neck: Supple without JVD or lymphadenopathy Heart: Reg rate and rhythm. No murmurs rubs or gallops Chest: CTA bilaterally without wheezes, rales, or rhonchi; no distress.  Port-A-Cath right chest Abdomen: Soft, non-tender, non-distended, bowel sounds positive. Extremities: Bilateral lower extremity edema Psych: Pt's affect is appropriate. Pt is cooperative Skin: Bordered foam dressing on the left shin Neuro: Alert and oriented to person, place, year month and situation-required some cues, provide some inconsistent details regarding location of his weakness, occasionally delayed responses, unable to count fingers with left eye, left pupil nonreactive to light, facial sensation intact light touch, facial movement appears symmetric, hearing intact to voice,  symmetric shoulder shrug, tongue midline, EOMI-cause some dizziness, able to name and repeat, able to name 2 out of 3 words 5 minutes later Finger-nose intact bilaterally Strength 5 out of 5 right upper extremity Strength left upper extremity shoulder abduction 4+ out of 5, distally 4- out of 5 Strength right lower extremity 4+ out of 5 Strength left lower extremity 4- out of 5 Sensation to light touch altered over the left foot Sensation to light touch altered over left digits 3-5-reports this is chronic Musculoskeletal: Tender to palpation throughout distal left lower extremity  IV right upper extremity in place  Results for orders placed or performed during the hospital encounter of 11/21/22 (from the past 48 hour(s))  Glucose, capillary     Status: Abnormal   Collection Time: 11/25/22  5:10 PM  Result Value Ref Range   Glucose-Capillary 188 (H) 70 - 99 mg/dL    Comment: Glucose reference range applies only to samples taken after fasting for at least 8 hours.  Glucose, capillary     Status: Abnormal  Collection Time: 11/25/22  9:46 PM  Result Value Ref Range   Glucose-Capillary 126 (H) 70 - 99 mg/dL    Comment: Glucose reference range applies only to samples taken after fasting for at least 8 hours.  Glucose, capillary     Status: Abnormal   Collection Time: 11/26/22  7:44 AM  Result Value Ref Range   Glucose-Capillary 102 (H) 70 - 99 mg/dL    Comment: Glucose reference range applies only to samples taken after fasting for at least 8 hours.  Glucose, capillary     Status: Abnormal   Collection Time: 11/26/22 11:50 AM  Result Value Ref Range   Glucose-Capillary 168 (H) 70 - 99 mg/dL    Comment: Glucose reference range applies only to samples taken after fasting for at least 8 hours.  Glucose, capillary     Status: Abnormal   Collection Time: 11/26/22  5:08 PM  Result Value Ref Range   Glucose-Capillary 139 (H) 70 - 99 mg/dL    Comment: Glucose reference range applies only to  samples taken after fasting for at least 8 hours.  Glucose, capillary     Status: Abnormal   Collection Time: 11/26/22  8:30 PM  Result Value Ref Range   Glucose-Capillary 127 (H) 70 - 99 mg/dL    Comment: Glucose reference range applies only to samples taken after fasting for at least 8 hours.  CBC with Differential/Platelet     Status: Abnormal   Collection Time: 11/27/22  4:47 AM  Result Value Ref Range   WBC 6.3 4.0 - 10.5 K/uL   RBC 3.64 (L) 4.22 - 5.81 MIL/uL   Hemoglobin 9.5 (L) 13.0 - 17.0 g/dL   HCT 16.1 (L) 09.6 - 04.5 %   MCV 82.1 80.0 - 100.0 fL   MCH 26.1 26.0 - 34.0 pg   MCHC 31.8 30.0 - 36.0 g/dL   RDW 40.9 (H) 81.1 - 91.4 %   Platelets 314 150 - 400 K/uL   nRBC 0.0 0.0 - 0.2 %   Neutrophils Relative % 58 %   Neutro Abs 3.6 1.7 - 7.7 K/uL   Lymphocytes Relative 21 %   Lymphs Abs 1.3 0.7 - 4.0 K/uL   Monocytes Relative 15 %   Monocytes Absolute 0.9 0.1 - 1.0 K/uL   Eosinophils Relative 6 %   Eosinophils Absolute 0.4 0.0 - 0.5 K/uL   Basophils Relative 0 %   Basophils Absolute 0.0 0.0 - 0.1 K/uL   Immature Granulocytes 0 %   Abs Immature Granulocytes 0.02 0.00 - 0.07 K/uL    Comment: Performed at [facility], [address], [place], [place] [address]  Basic metabolic panel     Status: Abnormal   Collection Time: 11/27/22  4:47 AM  Result Value Ref Range   Sodium 138 135 - 145 mmol/L   Potassium 4.2 3.5 - 5.1 mmol/L   Chloride 102 98 - 111 mmol/L   CO2 25 22 - 32 mmol/L   Glucose, Bld 106 (H) 70 - 99 mg/dL    Comment: Glucose reference range applies only to samples taken after fasting for at least 8 hours.   BUN 31 (H) 6 - 20 mg/dL   Creatinine, Ser 6.21 (H) 0.61 - 1.24 mg/dL   Calcium 8.4 (L) 8.9 - 10.3 mg/dL   GFR, Estimated >30 >86 mL/min    Comment: (NOTE) Calculated using the CKD-EPI Creatinine Equation (2021)    Anion gap 11 5 - 15    Comment: Performed at  [facility], [address] [place], [place] [address]  Glucose,  capillary     Status: Abnormal   Collection Time: 11/27/22 11:50 AM  Result Value Ref Range   Glucose-Capillary 193 (H) 70 - 99 mg/dL    Comment: Glucose reference range applies only to samples taken after fasting for at least 8 hours.   No results found.    Blood pressure (!) 151/86, pulse 79, temperature 98 F (36.7 C), temperature source Oral, resp. rate 18, height 5\' 11"  (1.803 m), weight 109.8 kg, SpO2 99%.  Medical Problem List and Plan: 1. Functional deficits secondary to subacute infarct left frontal operculum with petechial hemorrhage and mild surrounding edema as well as history of CVA with left-sided weakness 10/2021  -patient may shower  -ELOS/Goals: Supervision with PT and OT, 10 to 14 days  -Admit to CIR 2.  Antithrombotics: -DVT/anticoagulation:  Pharmaceutical: Eliquis  -antiplatelet therapy: N/A 3. Pain Management: Neurontin 100 mg twice daily, oxycodone as needed  -Increase Neurontin to 200mg  BID 4. Mood/Behavior/Sleep: Zoloft 50 mg daily, Lamictal 25 mg nightly, BuSpar 7.5 mg twice daily  -antipsychotic agents: N/A 5. Neuropsych/cognition: This patient is capable of making decisions on his own behalf. 6. Skin/Wound Care: Routine skin checks 7. Fluids/Electrolytes/Nutrition: Routine in and outs with follow-up chemistries 8.  Paroxysmal atrial fibrillation.  Continue Eliquis 2.5 mg twice daily.  Cardiac rate controlled 9.  GE junction carcinoma stage IV.  Chemotherapy per Dr.Yu.  Chemotherapy currently on hold follow-up outpatient 10.  Chronic combined systolic and diastolic congestive heart failure.  Continue Demadex 40 mg daily as needed.  Monitor for any signs of fluid overload.  Daily weights 11.  Diabetes mellitus.  Latest hemoglobin A1c 7.0.  Currently on SSI.  Patient on Farxiga 10 mg daily prior to admission.  Resume as needed 12.  Obesity.  BMI 35.11.  Dietary follow-up 13.  CKD stage IIIb.  Follow-up chemistries 14.  Hyperlipidemia.  Lipitor 15.   Hypomagnesemia.  Recheck levels 16.  Chronic anemia.  Recheck labs tomorrow 17. Obesity. Dietary counseling Body mass index is 33.76 kg/m. 18.  Essential hypertension.  Continue torsemide  Charlton Amor, PA-C 11/27/2022  I have personally performed a face to face diagnostic evaluation of this patient and formulated the key components of the plan.  Additionally, I have personally reviewed laboratory data, imaging studies, as well as relevant notes and concur with the physician assistant's documentation above.  The patient's status has not changed from the original H&P.  Any changes in documentation from the acute care chart have been noted above.  [name], MD, [place]

## 2022-11-27 NOTE — TOC Transition Note (Signed)
Transition of Care [facility]) - CM/SW Discharge Note   Patient Details  Name: Jeremiah Vance MRN: [id number] Date of Birth: [birth year]  [facility] CM/SW Contact:  Allena Katz, LCSW Phone Number: 11/27/2022, 10:20 AM   Clinical Narrative:   Pt to discharge to CIR. Medical neccesity printed to unit. No other needs at this time.     Final next level of care: Skilled Nursing Facility Barriers to Discharge: Barriers Resolved   Patient Goals and CMS Choice CMS Medicare.gov Compare Post Acute Care list provided to:: Patient Choice offered to / list presented to : Patient  Discharge Placement                  Patient to be transferred to facility by: [facility]   Patient and family notified of of transfer: 11/27/22  Discharge Plan and Services Additional resources added to the After Visit Summary for                                       Social Determinants of Health (SDOH) Interventions SDOH Screenings   Food Insecurity: No Food Insecurity (11/22/2022)  Housing: Patient Declined (11/22/2022)  Transportation Needs: No Transportation Needs (11/22/2022)  Recent Concern: Transportation Needs - Unmet Transportation Needs (09/27/2022)  Utilities: Not At Risk (11/22/2022)  Alcohol Screen: Low Risk  (11/28/2021)  Depression (PHQ2-9): Medium Risk (10/29/2022)  Financial Resource Strain: High Risk (03/20/2020)  Tobacco Use: Low Risk  (11/22/2022)     Readmission Risk Interventions    09/29/2022   10:04 AM  Readmission Risk Prevention Plan  Transportation Screening Complete  Medication Review (RN Care Manager) Complete  PCP or Specialist appointment within 3-5 days of discharge Complete  HRI or Home Care Consult Complete  SW Recovery Care/Counseling Consult Complete  Palliative Care Screening Not Applicable  Skilled Nursing Facility Not Applicable

## 2022-11-27 NOTE — H&P (Incomplete)
Physical Medicine and Rehabilitation Admission H&P    Chief Complaint  Patient presents with   Numbness    States he has been having L side arm and leg numbness since 930 am. CVA HX 3 yrs ago. Denies injury. 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  Per chart review patient lives with his sister.  Two-level home with bed and bath upstairs.  Modified independent with rolling walker outside the home, furniture cruises within the house.  Patient does not drive.  Presented to [facility] 11/21/2022 with left-sided numbness and increasing weakness.  Initial cranial CT scan showed compared to prior exam no new cytotoxic edema in the left frontal lobe with associated blood products.  No definite evidence of parenchymal hematoma.  MRI showed subacute infarct left frontal operculum with petechial hemorrhage and mild surrounding edema.  No significant mass effect or midline shift.  MRA identified no large vessel occlusion or stenosis.  Admission chemistries unremarkable except glucose 157 BUN 41 creatinine 1.51, hemoglobin 10.4, BNP 239, hemoglobin A1c 7.0.  Echocardiogram with ejection fraction of 55 to 60% no wall motion abnormalities.  Patient did not receive TNK.  Neurology service follow-up he was cleared to begin Eliquis for CVA prophylaxis as well as history of atrial fibrillation.  Hospital course follow-up oncology services Dr.Yu  for GE junction carcinoma and chemotherapy currently on hold follow-up outpatient.  Patient tolerating a regular consistency diet.  Therapy evaluations completed due to patient's decreased functional mobility increasing left-sided weakness and numbness was admitted for a comprehensive rehab program.  Review of Systems  Constitutional:  Negative for chills and fever.  HENT:  Negative for hearing loss.   Eyes:        Legally blind of the left eye and decreased vision on the right.  Respiratory:  Negative for cough.        Increased shortness of breath with exertion  Cardiovascular:  Positive for palpitations and leg swelling. Negative for chest pain.  Gastrointestinal:  Positive for constipation. Negative for heartburn, nausea and vomiting.  Genitourinary:  Positive for urgency. Negative for dysuria, flank pain and hematuria.  Musculoskeletal:  Positive for joint pain.  Skin:  Negative for rash.  Neurological:  Positive for weakness.  Psychiatric/Behavioral:  Positive for depression.        Anxiety  All other systems reviewed and are negative.  Past Medical History:  Diagnosis Date   Acute ischemic stroke (HCC) 09/06/2017   Acute renal failure superimposed on stage 3a chronic kidney disease (HCC) 07/08/2019   Acute right-sided weakness    Allergies    Anasarca    Anemia 07/10/2017   Arthritis    Cancer (HCC)    stomach and esophageal cancer stage 4   CHF (congestive heart failure) (HCC)    "coinsided w/kidney problems I was having 06/2017"   Chicken pox    CKD (chronic kidney disease) stage 3, GFR 30-59 ml/min (HCC) 06/2017   Depression    Elevated troponin 07/08/2019   High cholesterol  History of cardiomyopathy    LVEF 40 to 45% in April 2019 - subsequently normalized   Hyperbilirubinemia 07/10/2017   Hypertension    Ischemic stroke (HCC)    Small left internal capsule infarct due to lacunar disease   Morbid obesity (HCC)    Normocytic anemia 12/16/2017   Recurrent  incisional hernia with incarceration s/p repair 10/22/2017 10/21/2017   Stroke (HCC) 08/2017   "right sided weakness since; getting stronger though" (10/22/2017)   Type 2 diabetes mellitus [facility])    Past Surgical History:  Procedure Laterality Date   ABDOMINAL HERNIA REPAIR  2008; 10/22/2017   "scope; OPEN REPAIR INCARCERATED VENTRAL HERNIA   ESOPHAGOGASTRODUODENOSCOPY (EGD) WITH PROPOFOL N/A 09/10/2022   Procedure: ESOPHAGOGASTRODUODENOSCOPY (EGD) WITH PROPOFOL;  Surgeon: Toney Reil, MD;  Location: [facility] ENDOSCOPY;  Service: Gastroenterology;  Laterality: N/A;   ESOPHAGOGASTRODUODENOSCOPY (EGD) WITH PROPOFOL N/A 09/28/2022   Procedure: ESOPHAGOGASTRODUODENOSCOPY (EGD) WITH PROPOFOL;  Surgeon: Jaynie Collins, DO;  Location: [facility] ENDOSCOPY;  Service: Gastroenterology;  Laterality: N/A;   HEMOSTASIS CONTROL  09/28/2022   Procedure: HEMOSTASIS CONTROL;  Surgeon: Jaynie Collins, DO;  Location: [facility] ENDOSCOPY;  Service: Gastroenterology;;   HERNIA REPAIR     KNEE ARTHROSCOPY Right 1989   PORTA CATH INSERTION N/A 09/25/2022   Procedure: PORTA CATH INSERTION;  Surgeon: Annice Needy, MD;  Location: [facility] INVASIVE CV LAB;  Service: Cardiovascular;  Laterality: N/A;   VENTRAL HERNIA REPAIR N/A 10/22/2017   Procedure: OPEN REPAIR INCARCERATED VENTRAL HERNIA;  Surgeon: Glenna Fellows, MD;  Location: [facility] OR;  Service: General;  Laterality: N/A;   Family History  Problem Relation Age of Onset   Diabetes Mother    Stroke Mother    Arthritis Mother    Depression Mother    Heart disease Mother    Hypertension Mother    Learning disabilities Mother    Mental illness Mother    Sleep apnea Mother    Diabetes Father    Heart disease Father    Arthritis Father    Hearing loss Father    Hyperlipidemia Father    Heart attack Father    Hypertension Father    Stroke Father    Diabetes Sister    Depression Sister    Diabetes Sister    Hypertension Sister    Mental illness Sister     Sleep apnea Sister    Diabetes Maternal Grandmother    Heart disease Maternal Grandmother    Depression Maternal Grandmother    Hyperlipidemia Maternal Grandmother    Hypertension Maternal Grandmother    Stroke Maternal Grandmother    Hypertension Maternal Grandfather    Hyperlipidemia Maternal Grandfather    Stroke Maternal Grandfather    Arthritis Paternal Grandmother    Hearing loss Paternal Grandmother    Stroke Paternal Grandfather    Heart disease Paternal Grandfather    Arthritis Paternal Grandfather    Heart attack Paternal Grandfather    Melanoma Other    Social History:  reports that he has never smoked. He has never used smokeless tobacco. He reports current alcohol use. He reports that he does not use drugs. Allergies:  Allergies  Allergen Reactions   Pollen Extract Other (See Comments)   Sulfa Antibiotics    Medications Prior to Admission  Medication Sig Dispense Refill   atorvastatin (LIPITOR) 40 MG tablet Take 1 tablet (40 mg total) by mouth daily at 6 PM. 90 tablet 3   busPIRone (BUSPAR) 7.5 MG tablet Take 1 tablet (7.5 mg total) by  mouth 2 (two) times daily. 180 tablet 1   dexamethasone (DECADRON) 4 MG tablet Take 2 tablets (8 mg total) by mouth daily. Start the day after chemotherapy for 2 days. Take with food. 30 tablet 1   gabapentin (NEURONTIN) 100 MG capsule Take 1 capsule (100 mg total) by mouth 2 (two) times daily. 60 capsule 0   ondansetron (ZOFRAN-ODT) 8 MG disintegrating tablet Take 1 tablet (8 mg total) by mouth every 8 (eight) hours as needed for nausea or vomiting. 60 tablet 1   oxyCODONE (OXY IR/ROXICODONE) 5 MG immediate release tablet Take 5 mg by mouth every 4 (four) hours as needed for severe pain.     pantoprazole (PROTONIX) 40 MG tablet Take 1 tablet (40 mg total) by mouth 2 (two) times daily before a meal. 60 tablet 3   prochlorperazine (COMPAZINE) 10 MG tablet Take 1 tablet (10 mg total) by mouth every 6 (six) hours as needed for nausea or  vomiting. 60 tablet 0   sertraline (ZOLOFT) 50 MG tablet Take 1 tablet (50 mg total) by mouth daily. 30 tablet 3   torsemide (DEMADEX) 20 MG tablet Take 2 tablets (40 mg total) by mouth daily as needed (fluid/edema). 90 tablet 3   Continuous Glucose Sensor (FREESTYLE LIBRE 2 SENSOR) MISC Place 1 sensor on the skin every 14 days. Use to check glucose continuously 2 each 11   dapagliflozin propanediol (FARXIGA) 10 MG TABS tablet Take 1 tablet (10 mg total) by mouth daily. Take 1 tablet (10 mg) by mouth once daily/please contact office to schedule follow up prior to further refills. (Patient not taking: Reported on 11/12/2022) 90 tablet 3   scopolamine (TRANSDERM-SCOP) 1 MG/3DAYS Place 1 patch (1.5 mg total) onto the skin every 3 (three) days. 4 patch 0      Home: Home Living Family/patient expects to be discharged to:: Private residence Living Arrangements: Other relatives (sister) Available Help at Discharge: Family, Available 24 hours/day Type of Home: Other(Comment) (townhouse) Home Access: Level entry Home Layout: 1/2 bath on main level, Two level Alternate Level Stairs-Number of Steps: flight Alternate Level Stairs-Rails: Right Bathroom Shower/Tub: Health visitor: Standard Bathroom Accessibility: Yes Home Equipment: Shower seat - built in, Agricultural consultant (2 wheels) Additional Comments: reports can sleep in recliner on 1st floor if needed; sister works outside of the home  Lives With: Other (Comment) (sister)   Functional History: Prior Function Prior Level of Function : Independent/Modified Independent Mobility Comments: MOD I with RW outside of the home, furniture-cruises within the home; denies recent fall history ADLs Comments: sister assists with IADLs, .does not drive  Functional Status:  Mobility: Bed Mobility Overal bed mobility: Needs Assistance Bed Mobility: Supine to Sit Supine to sit: Contact guard Sit to supine: HOB elevated, Used rails, Mod  assist General bed mobility comments: NT in recliner pre/post session Transfers Overall transfer level: Needs assistance Equipment used: Rolling walker (2 wheels) Transfers: Sit to/from Stand Sit to Stand: Min assist Bed to/from chair/wheelchair/BSC transfer type:: Stand pivot Stand pivot transfers: Min assist General transfer comment: frequent multi modal cues for RW techinque Ambulation/Gait Ambulation/Gait assistance: Min assist Gait Distance (Feet): 75 Feet Assistive device: Rolling walker (2 wheels) Gait Pattern/deviations: Step-through pattern, Decreased step length - right, Decreased step length - left General Gait Details: distance is progressing daily with increased confidence and improving balance but gait remais slow and cautious Gait velocity: decreased    ADL: ADL Overall ADL's : Needs assistance/impaired Eating/Feeding: Minimal assistance, Sitting Eating/Feeding Details (indicate  cue type and reason): open package/containers, optimal positioning of tray due to visual defciits Grooming: Wash/dry face, Sitting, Supervision/safety Grooming Details (indicate cue type and reason): anticipated Upper Body Dressing : Set up, Sitting Upper Body Dressing Details (indicate cue type and reason): doff/down gown Lower Body Dressing: Maximal assistance Lower Body Dressing Details (indicate cue type and reason): doff socks Toilet Transfer: Minimal assistance, Rolling walker (2 wheels), Ambulation Toilet Transfer Details (indicate cue type and reason): simulated with RW in room Toileting- Clothing Manipulation and Hygiene: Minimal assistance, Sitting/lateral lean Functional mobility during ADLs: Minimal assistance, Rolling walker (2 wheels) (approx 6' in room with RW, frequent vcs for technique/safety) General ADL Comments: further mobility limited on this date due to reported pain in L calf, RN in room and pt assisted to supine position  Cognition: Cognition Overall Cognitive  Status: No family/caregiver present to determine baseline cognitive functioning Arousal/Alertness: Awake/alert Orientation Level: Oriented X4 Cognition Arousal: Alert Behavior During Therapy: Impulsive, WFL for tasks assessed/performed Overall Cognitive Status: No family/caregiver present to determine baseline cognitive functioning Area of Impairment: Orientation Current Attention Level: Sustained Following Commands: Follows one step commands with increased time, Follows multi-step commands inconsistently Safety/Judgement: Decreased awareness of deficits Awareness: Emergent Problem Solving: Slow processing, Requires verbal cues, Requires tactile cues General Comments: pt with ?higher level cognitive deficits, will continue to assess  Physical Exam: Blood pressure (!) 151/86, pulse 83, temperature 97.9 F (36.6 C), temperature source Oral, resp. rate 19, height 5\' 10"  (1.778 m), weight 111 kg, SpO2 95%. Physical Exam Neurological:     Comments: Patient is alert.  Provides name and age as well as place but limited to medical history     Results for orders placed or performed during the hospital encounter of 11/21/22 (from the past 48 hour(s))  Glucose, capillary     Status: Abnormal   Collection Time: 11/25/22  8:31 AM  Result Value Ref Range   Glucose-Capillary 111 (H) 70 - 99 mg/dL    Comment: Glucose reference range applies only to samples taken after fasting for at least 8 hours.  Glucose, capillary     Status: Abnormal   Collection Time: 11/25/22 12:00 PM  Result Value Ref Range   Glucose-Capillary 174 (H) 70 - 99 mg/dL    Comment: Glucose reference range applies only to samples taken after fasting for at least 8 hours.  Glucose, capillary     Status: Abnormal   Collection Time: 11/25/22  5:10 PM  Result Value Ref Range   Glucose-Capillary 188 (H) 70 - 99 mg/dL    Comment: Glucose reference range applies only to samples taken after fasting for at least 8 hours.  Glucose,  capillary     Status: Abnormal   Collection Time: 11/25/22  9:46 PM  Result Value Ref Range   Glucose-Capillary 126 (H) 70 - 99 mg/dL    Comment: Glucose reference range applies only to samples taken after fasting for at least 8 hours.  Glucose, capillary     Status: Abnormal   Collection Time: 11/26/22  7:44 AM  Result Value Ref Range   Glucose-Capillary 102 (H) 70 - 99 mg/dL    Comment: Glucose reference range applies only to samples taken after fasting for at least 8 hours.  Glucose, capillary     Status: Abnormal   Collection Time: 11/26/22 11:50 AM  Result Value Ref Range   Glucose-Capillary 168 (H) 70 - 99 mg/dL    Comment: Glucose reference range applies only to samples taken after fasting  for at least 8 hours.  Glucose, capillary     Status: Abnormal   Collection Time: 11/26/22  5:08 PM  Result Value Ref Range   Glucose-Capillary 139 (H) 70 - 99 mg/dL    Comment: Glucose reference range applies only to samples taken after fasting for at least 8 hours.  Glucose, capillary     Status: Abnormal   Collection Time: 11/26/22  8:30 PM  Result Value Ref Range   Glucose-Capillary 127 (H) 70 - 99 mg/dL    Comment: Glucose reference range applies only to samples taken after fasting for at least 8 hours.   No results found.    Blood pressure (!) 151/86, pulse 83, temperature 97.9 F (36.6 C), temperature source Oral, resp. rate 19, height 5\' 10"  (1.778 m), weight 111 kg, SpO2 95%.  Medical Problem List and Plan: 1. Functional deficits secondary to subacute infarct left frontal operculum with petechial hemorrhage and mild surrounding edema as well as history of CVA with left-sided weakness 10/2021  -patient may *** shower  -ELOS/Goals: *** 2.  Antithrombotics: -DVT/anticoagulation:  Pharmaceutical: Eliquis  -antiplatelet therapy: N/A 3. Pain Management: Neurontin 100 mg twice daily, oxycodone as needed 4. Mood/Behavior/Sleep: Zoloft 50 mg daily, Lamictal 25 mg nightly, BuSpar 7.5  mg twice daily  -antipsychotic agents: N/A 5. Neuropsych/cognition: This patient is capable of making decisions on his own behalf. 6. Skin/Wound Care: Routine skin checks 7. Fluids/Electrolytes/Nutrition: Routine in and outs with follow-up chemistries 8.  Paroxysmal atrial fibrillation.  Continue Eliquis 2.5 mg twice daily.  Cardiac rate controlled 9.  GE junction carcinoma stage IV.  Chemotherapy per Dr.Yu.  Chemotherapy currently on hold follow-up outpatient 10.  Chronic combined systolic and diastolic congestive heart failure.  Continue Demadex 40 mg daily as needed.  Monitor for any signs of fluid overload 11.  Diabetes mellitus.  Latest hemoglobin A1c 7.0.  Currently on SSI.  Patient on Farxiga 10 mg daily prior to admission.  Resume as needed 12.  Obesity.  BMI 35.11.  Dietary follow-up 13.  CKD stage III.  Follow-up chemistries 14.  Hyperlipidemia.  Lipitor     Charlton Amor, PA-C 11/27/2022

## 2022-11-27 NOTE — Discharge Summary (Signed)
Physician Discharge Summary   Patient: Jeremiah Vance MRN: [id number] DOB: [birth year]  Admit date:     11/21/2022  Discharge date: 11/27/22  Discharge Physician: Lurene Shadow   PCP: Debera Lat, PA-C   Recommendations at discharge:   Outpatient follow-up with oncologist as scheduled Outpatient follow-up with neurologist  Discharge Diagnoses: Principal Problem:   Stroke [facility]) Active Problems:   Paroxysmal atrial fibrillation (HCC)   GE junction carcinoma (HCC)   Hyperlipidemia   Chronic combined systolic and diastolic congestive heart failure (HCC)   Essential hypertension   Chronic kidney disease, stage 3b (HCC)   Hypomagnesemia   Type II diabetes mellitus with renal manifestations (HCC)   Depression with anxiety   Obesity (BMI 30-39.9)   Acute CVA (cerebrovascular accident) (HCC)  Resolved Problems:   * No resolved hospital problems. *  Hospital Course:   Jeremiah Vance is a 52 y.o. male with medical history significant of GE junction carcinoma, hypertension, hyperlipidemia, type 2 diabetes mellitus, chronic systolic CHF with EF estimated at 30 to 35%, stroke with mild left-sided weakness, depression, anxiety, atrial fibrillation (was not on anticoagulants), obesity, who presented to the hospital with left-sided numbness.    He was admitted to the hospital for acute stroke. MRI of the brain with and without contrast showed subacute infarct in the left frontal operculum with petechial hemorrhage and mild surrounding edema. No significant mass effect or midline shift. MRA negative foir LVO.   Assessment and Plan:   Stroke [facility]): pt has hx of stroke, now has left-sided numbness.  MRI of the brain with and without contrast showed subacute infarct in the left frontal operculum with petechial hemorrhage and mild surrounding edema. No significant mass effect or midline shift. MRA negative foir LVO. The MRI findings of left frontal operculum does not seem to explain his  left-sided numbness.   Neurologist recommended Eliquis for stroke prophylaxis. He was evaluated by PT and OT recommended further rehabilitation at the acute inpatient rehab facility.    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  Follow-up with Dr. Cathie Hoops, oncologist in the outpatient setting for further management.    Hyperlipidemia Continue Lipitor    Chronic combined systolic and diastolic congestive heart failure (HCC)  2D echo on 11/23/2021 showed EF 30-35% with grade 1 diastolic dysfunction.  BNP 239.9. Continue torsemide.   Hypomagnesemia:  -Repleted magnesium   Type II diabetes mellitus with renal manifestations [facility]): Recent A1c 5.9, well-controlled.      Other comorbidities include hypertension, CKD stage IIIb, depression with anxiety -Continue home medications         Consultants: Oncologist, neurologist Procedures performed: None Disposition: Rehabilitation facility Diet recommendation:  Discharge Diet Orders (From admission, onward)     Start     Ordered   11/27/22 0000  Diet - low sodium heart healthy        11/27/22 1103           Cardiac and Carb modified diet DISCHARGE MEDICATION: Allergies as of 11/27/2022       Reactions   Pollen Extract Other (See Comments)   Sulfa Antibiotics         Medication List     STOP taking these medications    dapagliflozin propanediol 10 MG Tabs tablet Commonly known as: Farxiga       TAKE these medications    apixaban 2.5 MG Tabs tablet Commonly known as: [employer]  Take 1 tablet (2.5 mg total) by mouth 2 (two) times daily.   atorvastatin 40 MG tablet Commonly known as: LIPITOR Take 1 tablet (40 mg total) by mouth daily at 6 PM.   busPIRone 7.5 MG tablet Commonly known as: BUSPAR Take 1 tablet (7.5 mg total) by mouth 2 (two) times daily.   dexamethasone 4 MG tablet Commonly known as: DECADRON Take 2  tablets (8 mg total) by mouth daily. Start the day after chemotherapy for 2 days. Take with food.   FreeStyle Libre 2 Sensor Misc Place 1 sensor on the skin every 14 days. Use to check glucose continuously   gabapentin 100 MG capsule Commonly known as: Neurontin Take 1 capsule (100 mg total) by mouth 2 (two) times daily.   lamoTRIgine 25 MG tablet Commonly known as: LAMICTAL Take 1 tablet (25 mg total) by mouth at bedtime.   ondansetron 8 MG disintegrating tablet Commonly known as: ZOFRAN-ODT Take 1 tablet (8 mg total) by mouth every 8 (eight) hours as needed for nausea or vomiting.   oxyCODONE 5 MG immediate release tablet Commonly known as: Oxy IR/ROXICODONE Take 5 mg by mouth every 4 (four) hours as needed for severe pain.   pantoprazole 40 MG tablet Commonly known as: PROTONIX Take 1 tablet (40 mg total) by mouth 2 (two) times daily before a meal.   prochlorperazine 10 MG tablet Commonly known as: COMPAZINE Take 1 tablet (10 mg total) by mouth every 6 (six) hours as needed for nausea or vomiting.   scopolamine 1 MG/3DAYS Commonly known as: TRANSDERM-SCOP Place 1 patch (1.5 mg total) onto the skin every 3 (three) days.   sertraline 50 MG tablet Commonly known as: ZOLOFT Take 1 tablet (50 mg total) by mouth daily.   torsemide 20 MG tablet Commonly known as: DEMADEX Take 2 tablets (40 mg total) by mouth daily as needed (fluid/edema).        Discharge Exam: Filed Weights   11/22/22 0100  Weight: 111 kg   GEN: NAD SKIN: Warm and dry.  Port-A-Cath on right upper chest EYES: No pallor or icterus ENT: MMM CV: RRR PULM: CTA B ABD: soft, obese/distended, midline lower abdominal hernia, NT, +BS CNS: AAO x 3, non focal EXT: Bilateral leg edema, no tenderness   Condition at discharge: good  The results of significant diagnostics from this hospitalization (including imaging, microbiology, ancillary and laboratory) are listed below for reference.   Imaging  Studies: ECHOCARDIOGRAM COMPLETE  Result Date: 11/22/2022    ECHOCARDIOGRAM REPORT   Patient Name:   Jeremiah Vance Date of Exam: 11/22/2022 Medical Rec #:  [id number]       Height:       70.0 in Accession #:    [id number]      Weight:       244.7 lb Date of Birth:  1970-07-02       BSA:          2.274 m Patient Age:    51 years        BP:           154/84 mmHg Patient Gender: M               HR:           83 bpm. Exam Location:  [facility] Procedure: 2D Echo, Cardiac Doppler and Color Doppler Indications:     Stroke I63.9  History:         Patient has prior history of Echocardiogram examinations, most  recent 11/23/2021. CHF, Stroke; Risk Factors:Hypertension.  Sonographer:     Cristela Blue Referring Phys:  [id number] Brien Few NIU Diagnosing Phys: Debbe Odea MD  Sonographer Comments: Suboptimal apical window. IMPRESSIONS  1. Left ventricular ejection fraction, by estimation, is 55 to 60%. The left ventricle has normal function. The left ventricle has no regional wall motion abnormalities. There is mild left ventricular hypertrophy. Left ventricular diastolic parameters are indeterminate.  2. Right ventricular systolic function is low normal. The right ventricular size is mildly enlarged. There is normal pulmonary artery systolic pressure.  3. Left atrial size was mildly dilated.  4. The mitral valve is normal in structure. Mild mitral valve regurgitation.  5. The aortic valve is tricuspid. Aortic valve regurgitation is not visualized.  6. Aortic dilatation noted. There is borderline dilatation of the aortic root, measuring 38 mm.  7. The inferior vena cava is dilated in size with <50% respiratory variability, suggesting right atrial pressure of 15 mmHg. FINDINGS  Left Ventricle: Left ventricular ejection fraction, by estimation, is 55 to 60%. The left ventricle has normal function. The left ventricle has no regional wall motion abnormalities. The left ventricular internal cavity size was normal in size.  There is  mild left ventricular hypertrophy. Left ventricular diastolic parameters are indeterminate. Right Ventricle: The right ventricular size is mildly enlarged. No increase in right ventricular wall thickness. Right ventricular systolic function is low normal. There is normal pulmonary artery systolic pressure. The tricuspid regurgitant velocity is 1.75 m/s, and with an assumed right atrial pressure of 15 mmHg, the estimated right ventricular systolic pressure is 27.2 mmHg. Left Atrium: Left atrial size was mildly dilated. Right Atrium: Right atrial size was normal in size. Pericardium: There is no evidence of pericardial effusion. Mitral Valve: The mitral valve is normal in structure. Mild mitral valve regurgitation. Tricuspid Valve: The tricuspid valve is normal in structure. Tricuspid valve regurgitation is not demonstrated. Aortic Valve: The aortic valve is tricuspid. Aortic valve regurgitation is not visualized. Aortic valve mean gradient measures 2.0 mmHg. Aortic valve peak gradient measures 3.2 mmHg. Aortic valve area, by VTI measures 3.12 cm. Pulmonic Valve: The pulmonic valve was normal in structure. Pulmonic valve regurgitation is not visualized. Aorta: Aortic dilatation noted. There is borderline dilatation of the aortic root, measuring 38 mm. Venous: The inferior vena cava is dilated in size with less than 50% respiratory variability, suggesting right atrial pressure of 15 mmHg. IAS/Shunts: No atrial level shunt detected by color flow Doppler.  LEFT VENTRICLE PLAX 2D LVIDd:         4.80 cm LVIDs:         3.30 cm LV PW:         2.00 cm LV IVS:        1.60 cm LVOT diam:     2.30 cm LV SV:         40 LV SV Index:   18 LVOT Area:     4.15 cm  RIGHT VENTRICLE RV Basal diam:  4.80 cm RV Mid diam:    3.80 cm RV S prime:     12.40 cm/s TAPSE (M-mode): 2.6 cm LEFT ATRIUM           Index        RIGHT ATRIUM           Index LA diam:      4.90 cm 2.15 cm/m   RA Area:     19.80 cm LA Vol (A2C): 61.8 ml 27.17  ml/m  RA Volume:   58.60 ml  25.77 ml/m LA Vol (A4C): 78.6 ml 34.56 ml/m  AORTIC VALVE AV Area (Vmax):    2.79 cm AV Area (Vmean):   2.93 cm AV Area (VTI):     3.12 cm AV Vmax:           89.05 cm/s AV Vmean:          58.200 cm/s AV VTI:            0.130 m AV Peak Grad:      3.2 mmHg AV Mean Grad:      2.0 mmHg LVOT Vmax:         59.90 cm/s LVOT Vmean:        41.000 cm/s LVOT VTI:          0.097 m LVOT/AV VTI ratio: 0.75  AORTA Ao Root diam: 3.80 cm MITRAL VALVE               TRICUSPID VALVE MV Area (PHT): 5.58 cm    TR Peak grad:   12.2 mmHg MV Decel Time: 136 msec    TR Vmax:        175.00 cm/s MV E velocity: 92.20 cm/s                            SHUNTS                            Systemic VTI:  0.10 m                            Systemic Diam: 2.30 cm Debbe Odea MD Electronically signed by Debbe Odea MD Signature Date/Time: 11/22/2022/12:49:58 PM    Final    US Venous Img Lower Unilateral Left (DVT)  Result Date: 11/22/2022 CLINICAL DATA:  Left lower extremity pain and swelling EXAM: LEFT LOWER EXTREMITY VENOUS DOPPLER ULTRASOUND TECHNIQUE: Gray-scale sonography with compression, as well as color and duplex ultrasound, were performed to evaluate the deep venous system(s) from the level of the common femoral vein through the popliteal and proximal calf veins. COMPARISON:  None Available. FINDINGS: VENOUS Normal compressibility of the common femoral, superficial femoral, and popliteal veins, as well as the visualized calf veins. Visualized portions of profunda femoral vein and great saphenous vein unremarkable. No filling defects to suggest DVT on grayscale or color Doppler imaging. Doppler waveforms show normal direction of venous flow, normal respiratory plasticity and response to augmentation. Limited views of the contralateral common femoral vein are unremarkable. OTHER None. Limitations: none IMPRESSION: Negative. Electronically Signed   By: Malachy Moan M.D.   On: 11/22/2022 11:00    MR Brain W and Wo Contrast  Result Date: 11/21/2022 CLINICAL DATA:  Neuro deficit, acute, stroke suspected. Whole-body numbness, worse on left side. History of stroke with left-sided deficits. EXAM: MRI HEAD WITHOUT AND WITH CONTRAST MRA HEAD WITHOUT CONTRAST MRA NECK WITHOUT AND WITH CONTRAST TECHNIQUE: Multiplanar, multi-echo pulse sequences of the brain and surrounding structures were acquired without and with intravenous contrast. Angiographic images of the Circle of Willis were acquired using MRA technique without intravenous contrast. Angiographic images of the neck were acquired using MRA technique without and with intravenous contrast. Carotid stenosis measurements (when applicable) are obtained utilizing NASCET criteria, using the distal internal carotid diameter as the denominator. 3D post processing, including maximum intensity projections and multiplanar  reformations, was performed for better evaluation of the vasculature. CONTRAST:  10mL GADAVIST GADOBUTROL 1 MMOL/ML IV SOLN COMPARISON:  MRI brain 05/20/2022.  Head CT 11/21/2022. FINDINGS: MRI HEAD FINDINGS Brain: Subacute infarct in the left frontal operculum with petechial hemorrhage and mild surrounding edema. No significant mass effect or midline shift. Unchanged background of moderate chronic small-vessel disease with numerous perforator infarcts in the bilateral basal ganglia, thalami and cerebral white matter. Sequela of prior hemorrhage in the right external capsule/corona radiata. Multiple chronic microhemorrhages in the basal ganglia and pons. No hydrocephalus or extra-axial collection. No mass effect or midline shift. Vascular: Normal flow voids and vessel enhancement. Skull and upper cervical spine: Normal marrow signal and enhancement. Sinuses/Orbits: No acute or significant finding. Other: None. MRA HEAD FINDINGS Anterior circulation: Intracranial ICAs are patent without stenosis or aneurysm. The proximal ACAs and MCAs are patent  without stenosis or aneurysm. Distal branches are symmetric. Posterior circulation: Visualized portions of the distal vertebral arteries and basilar artery are patent without stenosis or aneurysm. The SCAs, AICAs and PICAs are patent proximally. The PCAs are patent proximally without stenosis or aneurysm. Distal branches are symmetric. Anatomic variants: None. MRA NECK FINDINGS Aortic arch: Three-vessel arch configuration. Arch vessel origins are patent. Right carotid system: No evidence of dissection, stenosis (50% or greater), or occlusion. Tortuosity of the right cervical ICA. Left carotid system: No evidence of dissection, stenosis (50% or greater), or occlusion. Tortuosity of the left cervical ICA. Vertebral arteries: Left dominant. No evidence of dissection, stenosis (50% or greater), or occlusion. The right vertebral artery functionally terminates in PICA. Other: None. IMPRESSION: 1. Subacute infarct in the left frontal operculum with petechial hemorrhage and mild surrounding edema. No significant mass effect or midline shift. 2. No large vessel occlusion, hemodynamically significant stenosis, or aneurysm in the head or neck. Electronically Signed   By: Orvan Falconer M.D.   On: 11/21/2022 18:19   MR ANGIO HEAD WO CONTRAST  Result Date: 11/21/2022 CLINICAL DATA:  Neuro deficit, acute, stroke suspected. Whole-body numbness, worse on left side. History of stroke with left-sided deficits. EXAM: MRI HEAD WITHOUT AND WITH CONTRAST MRA HEAD WITHOUT CONTRAST MRA NECK WITHOUT AND WITH CONTRAST TECHNIQUE: Multiplanar, multi-echo pulse sequences of the brain and surrounding structures were acquired without and with intravenous contrast. Angiographic images of the Circle of Willis were acquired using MRA technique without intravenous contrast. Angiographic images of the neck were acquired using MRA technique without and with intravenous contrast. Carotid stenosis measurements (when applicable) are obtained  utilizing NASCET criteria, using the distal internal carotid diameter as the denominator. 3D post processing, including maximum intensity projections and multiplanar reformations, was performed for better evaluation of the vasculature. CONTRAST:  10mL GADAVIST GADOBUTROL 1 MMOL/ML IV SOLN COMPARISON:  MRI brain 05/20/2022.  Head CT 11/21/2022. FINDINGS: MRI HEAD FINDINGS Brain: Subacute infarct in the left frontal operculum with petechial hemorrhage and mild surrounding edema. No significant mass effect or midline shift. Unchanged background of moderate chronic small-vessel disease with numerous perforator infarcts in the bilateral basal ganglia, thalami and cerebral white matter. Sequela of prior hemorrhage in the right external capsule/corona radiata. Multiple chronic microhemorrhages in the basal ganglia and pons. No hydrocephalus or extra-axial collection. No mass effect or midline shift. Vascular: Normal flow voids and vessel enhancement. Skull and upper cervical spine: Normal marrow signal and enhancement. Sinuses/Orbits: No acute or significant finding. Other: None. MRA HEAD FINDINGS Anterior circulation: Intracranial ICAs are patent without stenosis or aneurysm. The proximal ACAs and MCAs are  patent without stenosis or aneurysm. Distal branches are symmetric. Posterior circulation: Visualized portions of the distal vertebral arteries and basilar artery are patent without stenosis or aneurysm. The SCAs, AICAs and PICAs are patent proximally. The PCAs are patent proximally without stenosis or aneurysm. Distal branches are symmetric. Anatomic variants: None. MRA NECK FINDINGS Aortic arch: Three-vessel arch configuration. Arch vessel origins are patent. Right carotid system: No evidence of dissection, stenosis (50% or greater), or occlusion. Tortuosity of the right cervical ICA. Left carotid system: No evidence of dissection, stenosis (50% or greater), or occlusion. Tortuosity of the left cervical ICA. Vertebral  arteries: Left dominant. No evidence of dissection, stenosis (50% or greater), or occlusion. The right vertebral artery functionally terminates in PICA. Other: None. IMPRESSION: 1. Subacute infarct in the left frontal operculum with petechial hemorrhage and mild surrounding edema. No significant mass effect or midline shift. 2. No large vessel occlusion, hemodynamically significant stenosis, or aneurysm in the head or neck. Electronically Signed   By: Orvan Falconer M.D.   On: 11/21/2022 18:19   MR Angiogram Neck W or Wo Contrast  Result Date: 11/21/2022 CLINICAL DATA:  Neuro deficit, acute, stroke suspected. Whole-body numbness, worse on left side. History of stroke with left-sided deficits. EXAM: MRI HEAD WITHOUT AND WITH CONTRAST MRA HEAD WITHOUT CONTRAST MRA NECK WITHOUT AND WITH CONTRAST TECHNIQUE: Multiplanar, multi-echo pulse sequences of the brain and surrounding structures were acquired without and with intravenous contrast. Angiographic images of the Circle of Willis were acquired using MRA technique without intravenous contrast. Angiographic images of the neck were acquired using MRA technique without and with intravenous contrast. Carotid stenosis measurements (when applicable) are obtained utilizing NASCET criteria, using the distal internal carotid diameter as the denominator. 3D post processing, including maximum intensity projections and multiplanar reformations, was performed for better evaluation of the vasculature. CONTRAST:  10mL GADAVIST GADOBUTROL 1 MMOL/ML IV SOLN COMPARISON:  MRI brain 05/20/2022.  Head CT 11/21/2022. FINDINGS: MRI HEAD FINDINGS Brain: Subacute infarct in the left frontal operculum with petechial hemorrhage and mild surrounding edema. No significant mass effect or midline shift. Unchanged background of moderate chronic small-vessel disease with numerous perforator infarcts in the bilateral basal ganglia, thalami and cerebral white matter. Sequela of prior hemorrhage in  the right external capsule/corona radiata. Multiple chronic microhemorrhages in the basal ganglia and pons. No hydrocephalus or extra-axial collection. No mass effect or midline shift. Vascular: Normal flow voids and vessel enhancement. Skull and upper cervical spine: Normal marrow signal and enhancement. Sinuses/Orbits: No acute or significant finding. Other: None. MRA HEAD FINDINGS Anterior circulation: Intracranial ICAs are patent without stenosis or aneurysm. The proximal ACAs and MCAs are patent without stenosis or aneurysm. Distal branches are symmetric. Posterior circulation: Visualized portions of the distal vertebral arteries and basilar artery are patent without stenosis or aneurysm. The SCAs, AICAs and PICAs are patent proximally. The PCAs are patent proximally without stenosis or aneurysm. Distal branches are symmetric. Anatomic variants: None. MRA NECK FINDINGS Aortic arch: Three-vessel arch configuration. Arch vessel origins are patent. Right carotid system: No evidence of dissection, stenosis (50% or greater), or occlusion. Tortuosity of the right cervical ICA. Left carotid system: No evidence of dissection, stenosis (50% or greater), or occlusion. Tortuosity of the left cervical ICA. Vertebral arteries: Left dominant. No evidence of dissection, stenosis (50% or greater), or occlusion. The right vertebral artery functionally terminates in PICA. Other: None. IMPRESSION: 1. Subacute infarct in the left frontal operculum with petechial hemorrhage and mild surrounding edema. No significant mass effect  or midline shift. 2. No large vessel occlusion, hemodynamically significant stenosis, or aneurysm in the head or neck. Electronically Signed   By: Orvan Falconer M.D.   On: 11/21/2022 18:19   CT Head Wo Contrast  Result Date: 11/21/2022 CLINICAL DATA:  Neuro deficit, acute, stroke suspected EXAM: CT HEAD WITHOUT CONTRAST TECHNIQUE: Contiguous axial images were obtained from the base of the skull through  the vertex without intravenous contrast. RADIATION DOSE REDUCTION: This exam was performed according to the departmental dose-optimization program which includes automated exposure control, adjustment of the mA and/or kV according to patient size and/or use of iterative reconstruction technique. COMPARISON:  Brain MR 05/20/22 FINDINGS: Brain: Compared to prior exam there is new cytotoxic edema in the left frontal lobe with associated blood products this is favored to represent an acute infarct with associated petechial hemorrhage. No evidence of a parenchymal hematoma. No hydrocephalus. No extra-axial fluid collection. Sequela of moderate chronic microvascular ischemic change with chronic infarcts in the right corona radiata and left thalamus. No mass effect. No midline shift. Vascular: No hyperdense vessel or unexpected calcification. Skull: Normal. Negative for fracture or focal lesion. Sinuses/Orbits: No middle ear or mastoid effusion. Paranasal sinuses are clear. Orbits are unremarkable. Other: None. IMPRESSION: Compared to prior exam there is new cytotoxic edema in the left frontal lobe with associated blood products. This is favored to represent an acute to subacute infarct with likely petechial hemorrhage. No definite evidence of a parenchymal hematoma Electronically Signed   By: Lorenza Cambridge M.D.   On: 11/21/2022 15:41   US Venous Img Lower Unilateral Left  Result Date: 10/29/2022 CLINICAL DATA:  Left lower extremity swelling. EXAM: Left LOWER EXTREMITY VENOUS DOPPLER ULTRASOUND TECHNIQUE: Gray-scale sonography with compression, as well as color and duplex ultrasound, were performed to evaluate the deep venous system(s) from the level of the common femoral vein through the popliteal and proximal calf veins. COMPARISON:  None Available. FINDINGS: VENOUS Normal compressibility of the common femoral, superficial femoral, and popliteal veins, as well as the visualized calf veins. Visualized portions of  profunda femoral vein and great saphenous vein unremarkable. No filling defects to suggest DVT on grayscale or color Doppler imaging. Doppler waveforms show normal direction of venous flow, normal respiratory plasticity and response to augmentation. Limited views of the contralateral common femoral vein are unremarkable. OTHER None. Limitations: none IMPRESSION: Negative. Electronically Signed   By: Lupita Raider M.D.   On: 10/29/2022 15:38    Microbiology: Results for orders placed or performed during the hospital encounter of 10/27/22  Culture, blood (Routine x 2)     Status: None   Collection Time: 10/27/22  1:00 PM   Specimen: BLOOD  Result Value Ref Range Status   Specimen Description BLOOD BLOOD LEFT FOREARM  Final   Special Requests   Final    BOTTLES DRAWN AEROBIC AND ANAEROBIC Blood Culture results may not be optimal due to an inadequate volume of blood received in culture bottles   Culture   Final    NO GROWTH 5 DAYS Performed at [facility], [address] [place], [place] [address]    Report Status 11/01/2022 FINAL  Final  Culture, blood (Routine x 2)     Status: None   Collection Time: 10/27/22  1:05 PM   Specimen: BLOOD  Result Value Ref Range Status   Specimen Description BLOOD RIGHT ANTECUBITAL  Final   Special Requests   Final    BOTTLES DRAWN AEROBIC AND ANAEROBIC Blood Culture  adequate volume   Culture   Final    NO GROWTH 5 DAYS Performed at [facility], [address] [place] [place], [place] [address]    Report Status 11/01/2022 FINAL  Final    Labs: CBC: Recent Labs  Lab 11/21/22 1450 11/23/22 0822 11/24/22 1019 11/27/22 0447  WBC 6.4 6.4 7.1 6.3  NEUTROABS 4.5 4.1 5.0 3.6  HGB 10.4* 9.3* 9.6* 9.5*  HCT 31.9* 29.0* 30.6* 29.9*  MCV 82.9 83.3 84.5 82.1  PLT 248 215 225 314   Basic Metabolic Panel: Recent Labs  Lab 11/21/22 1450 11/23/22 0822 11/27/22 0447  NA 135 136 138  K 3.6 3.8 4.2  CL 103 101 102  CO2 27 26 25    GLUCOSE 157* 113* 106*  BUN 41* 26* 31*  CREATININE 1.51* 1.29* 1.37*  CALCIUM 8.3* 8.2* 8.4*  MG 1.6*  --   --   PHOS 2.9  --   --    Liver Function Tests: Recent Labs  Lab 11/21/22 1450  AST 16  ALT 16  ALKPHOS 88  BILITOT 0.9  PROT 7.2  ALBUMIN 3.3*   CBG: Recent Labs  Lab 11/25/22 2146 11/26/22 0744 11/26/22 1150 11/26/22 1708 11/26/22 2030  GLUCAP 126* 102* 168* 139* 127*    Discharge time spent: greater than 30 minutes.  Signed: Lurene Shadow, MD [facility] 11/27/2022

## 2022-11-27 NOTE — Progress Notes (Signed)
[name], MD  Physician Physical Medicine and Rehabilitation   PMR Pre-admission    Signed   Date of Service: 11/23/2022 11:30 AM  Related encounter: ED to Hosp-Admission (Discharged) from 11/21/2022 in [facility] 1C MEDICAL TELEMETRY   Signed     Expand All Collapse All  Show:Clear all [x] Written[x] Templated[x] Copied  Added by: [x] Standley Brooking, RN[x] Theodoro Kos, Lauren Demetrius Charity, CCC-SLP[x] [name], MD  [] Hover for details PMR Admission Coordinator Pre-Admission Assessment   Patient: Jeremiah Vance is an 52 y.o., male MRN: [id number] DOB: 1970-12-29 Height: 5\' 10"  (177.8 cm) Weight: 111 kg   Insurance Information HMO:     PPO:      PCP:      IPA:      80/20:      OTHER:  PRIMARY: [place] Medicaid [employer] [place]      Policy#: [id number]      Subscriber: patient CM Name: via fax approval      Phone#: [phone]     Fax#: [phone] Pre-Cert#: [id number]  approved 8/28 until 12/09/22. Updates due within 2 days of 9/9    Employer:  Benefits:  Phone #: [phone]     Name:  Eff. Date: 07/01/22-06/30/23     Deduct: NA      Out of Pocket Max: NA      Life Max: NA CIR: 100% coverage      SNF: 100% coverage Outpatient: 100% coverage     Co-Pay:  Home Health: 100% coverage      Co-Pay:  DME: 100% coverage     Co-Pay:  Providers: in-network SECONDARY:       Policy#:      Phone#:    Artist:       Phone#:    The Engineer, materials Information Summary" for patients in Inpatient Rehabilitation Facilities with attached "Privacy Act Statement-Health Care Records" was provided and verbally reviewed with: N/A   Emergency Contact Information Contact Information       Name Relation Home Work Mobile    Vance,Jeremiah Sister [phone]         Other Contacts   None on File      Current Medical History  Patient Admitting Diagnosis: CVA   History of Present Illness:   52 year old  right-handed male with 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  Presented to [facility] 11/21/2022 with left-sided numbness and increasing weakness.    Initial cranial CT scan showed compared to prior exam no new cytotoxic edema in the left frontal lobe with associated blood products.  No definite evidence of parenchymal hematoma.  MRI showed subacute infarct left frontal operculum with petechial hemorrhage and mild surrounding edema.  No significant mass effect or midline shift.  MRA identified no large vessel occlusion or stenosis.  Admission chemistries unremarkable except glucose 157 BUN 41 creatinine 1.51, hemoglobin 10.4, BNP 239, hemoglobin A1c 7.0.  Echocardiogram with ejection fraction of 55 to 60% no wall motion abnormalities.  Patient did not receive TNK.  Neurology service follow-up he was cleared to begin Eliquis for CVA prophylaxis as well as history of atrial fibrillation.  Hospital course follow-up oncology services Dr.Yu for GE junction carcinoma and chemotherapy currently on hold follow-up outpatient.  Patient tolerating a regular consistency diet.     Complete NIHSS TOTAL: 4   Patient[facility] medical record from [facility] has been reviewed by the rehabilitation admission coordinator and physician.   Past Medical History      Past Medical History:  Diagnosis Date   Acute ischemic stroke (HCC) 09/06/2017   Acute renal failure superimposed on stage 3a chronic kidney disease (HCC) 07/08/2019   Acute right-sided  weakness     Allergies     Anasarca     Anemia 07/10/2017   Arthritis     Cancer (HCC)      stomach and esophageal cancer stage 4   CHF (congestive heart failure) (HCC)      "coinsided w/kidney problems I was having 06/2017"   Chicken pox     CKD (chronic kidney disease) stage 3, GFR 30-59 ml/min (HCC) 06/2017   Depression     Elevated troponin 07/08/2019   High cholesterol     History of cardiomyopathy      LVEF 40 to 45% in April 2019 - subsequently normalized   Hyperbilirubinemia 07/10/2017   Hypertension     Ischemic stroke (HCC)      Small left internal capsule infarct due to lacunar disease   Morbid obesity (HCC)     Normocytic anemia 12/16/2017   Recurrent incisional hernia with incarceration s/p repair 10/22/2017 10/21/2017   Stroke (HCC) 08/2017    "right sided weakness since; getting stronger though" (10/22/2017)   Type 2 diabetes mellitus (HCC)          Has the patient had major surgery during 100 days prior to admission? Yes   Family History   family history includes Arthritis in his father, mother, paternal grandfather, and paternal grandmother; Depression in his maternal grandmother, mother, and sister; Diabetes in his father, maternal grandmother, mother, sister, and sister; Hearing loss in his father and paternal grandmother; Heart attack in his father and paternal grandfather; Heart disease in his father, maternal grandmother, mother, and paternal grandfather; Hyperlipidemia in his father, maternal grandfather, and maternal grandmother; Hypertension in his father, maternal grandfather, maternal grandmother, mother, and sister; Learning disabilities in his mother; Melanoma in an other family member; Mental illness in his mother and sister; Sleep apnea in his mother and sister; Stroke in his father, maternal grandfather, maternal grandmother, mother, and paternal grandfather.   Current Medications  Current Medications    Current Facility-Administered Medications:     acetaminophen (TYLENOL) tablet 650 mg, 650 mg, Oral, Q4H PRN **OR** acetaminophen (TYLENOL) 160 MG/5ML solution 650 mg, 650 mg, Per Tube, Q4H PRN **OR** acetaminophen (TYLENOL) suppository 650 mg, 650 mg, Rectal, Q4H PRN, Lorretta Harp, MD   apixaban Everlene Balls) tablet 2.5 mg, 2.5 mg, Oral, BID, Sreenath, Sudheer B, MD, 2.5 mg at 11/27/22 0823   atorvastatin (LIPITOR) tablet 40 mg, 40 mg, Oral, q1800, Lorretta Harp, MD, 40 mg at 11/26/22 1739   busPIRone (BUSPAR) tablet 7.5 mg, 7.5 mg, Oral, BID, Lorretta Harp, MD, 7.5 mg at 11/27/22 1610   gabapentin (NEURONTIN) capsule 100 mg, 100 mg, Oral, BID, Lorretta Harp, MD, 100 mg at 11/27/22 9604   hydrALAZINE (APRESOLINE) injection 5 mg, 5 mg, Intravenous, Q2H PRN, Lorretta Harp, MD   insulin aspart (novoLOG) injection 0-5 Units, 0-5 Units, Subcutaneous, QHS, Niu, Xilin, MD   insulin aspart (novoLOG) injection 0-9 Units, 0-9 Units, Subcutaneous, TID WC, Lorretta Harp, MD,  1 Units at 11/26/22 1739   lamoTRIgine (LAMICTAL) tablet 25 mg, 25 mg, Oral, QHS, Bhagat, Srishti L, MD, 25 mg at 11/26/22 2117   ondansetron (ZOFRAN) injection 4 mg, 4 mg, Intravenous, Q8H PRN, Lorretta Harp, MD   oxyCODONE (Oxy IR/ROXICODONE) immediate release tablet 5-10 mg, 5-10 mg, Oral, Q4H PRN, Georgeann Oppenheim, Sudheer B, MD, 10 mg at 11/27/22 0823   pantoprazole (PROTONIX) EC tablet 40 mg, 40 mg, Oral, BID AC, Lorretta Harp, MD, 40 mg at 11/27/22 4098   senna-docusate (Senokot-S) tablet 1 tablet, 1 tablet, Oral, QHS PRN, Lorretta Harp, MD, 1 tablet at 11/23/22 2142   sertraline (ZOLOFT) tablet 50 mg, 50 mg, Oral, Daily, Lorretta Harp, MD, 50 mg at 11/27/22 1191   torsemide (DEMADEX) tablet 40 mg, 40 mg, Oral, Daily PRN, Lorretta Harp, MD     Patients Current Diet:  Diet Order                  Diet Heart Fluid consistency: Thin  Diet effective now                       Precautions / Restrictions Precautions Precautions: Fall Restrictions Weight Bearing Restrictions: No    Has the patient had 2 or more  falls or a fall with injury in the past year? No   Prior Activity Level Limited Community (1-2x/wk): gets out of house 1-2x/week   Prior Functional Level Self Care: Did the patient need help bathing, dressing, using the toilet or eating? Independent   Indoor Mobility: Did the patient need assistance with walking from room to room (with or without device)? Independent   Stairs: Did the patient need assistance with internal or external stairs (with or without device)? Independent   Functional Cognition: Did the patient need help planning regular tasks such as shopping or remembering to take medications? Needed some help   Patient Information Are you of Hispanic, Latino/a,or Spanish origin?: A. No, not of Hispanic, Latino/a, or Spanish origin What is your race?: A. White Do you need or want an interpreter to communicate with a doctor or health care staff?: 0. No   Patient's Response To:  Health Literacy and Transportation Is the patient able to respond to health literacy and transportation needs?: Yes Health Literacy - How often do you need to have someone help you when you read instructions, pamphlets, or other written material from your doctor or pharmacy?: Always (sister reads it to pt) In the past 12 months, has lack of transportation kept you from medical appointments or from getting medications?: No In the past 12 months, has lack of transportation kept you from meetings, work, or from getting things needed for daily living?: No   Home Assistive Devices / Equipment Home Assistive Devices/Equipment: Environmental consultant (specify type) Home Equipment: Shower seat - built in, [employer] (2 wheels)   Prior Device Use: Indicate devices/aids used by the patient prior to current illness, exacerbation or injury? Walker   Current Functional Level Cognition   Arousal/Alertness: Awake/alert Overall Cognitive Status: No family/caregiver present to determine baseline cognitive functioning Current  Attention Level: Sustained Orientation Level: Oriented X4 Following Commands: Follows multi-step commands consistently Safety/Judgement: Decreased awareness of safety, Decreased awareness of deficits General Comments: pt with ?higher level cognitive deficits, will continue to assess    Extremity Assessment (includes Sensation/Coordination)   Upper Extremity Assessment: Right hand dominant, LUE deficits/detail LUE Deficits / Details: grossly 3+/5 throughout LUE; RUE 4/5 throughout LUE Sensation: decreased light touch LUE  Coordination: decreased fine motor  Lower Extremity Assessment: Generalized weakness, LLE deficits/detail LLE Deficits / Details: grossly 3-/5 throughout, limited (in part) by pain to L LE LLE Sensation: decreased light touch     ADLs   Overall ADL's : Needs assistance/impaired Eating/Feeding: Minimal assistance, Sitting Eating/Feeding Details (indicate cue type and reason): open package/containers, optimal positioning of tray due to visual defciits Grooming: Wash/dry face, Sitting, Supervision/safety Grooming Details (indicate cue type and reason): anticipated Upper Body Dressing : Set up, Sitting Upper Body Dressing Details (indicate cue type and reason): doff/down gown Lower Body Dressing: Maximal assistance Lower Body Dressing Details (indicate cue type and reason): doff socks Toilet Transfer: Minimal assistance, Rolling walker (2 wheels), Ambulation Toilet Transfer Details (indicate cue type and reason): simulated with RW in room Toileting- Clothing Manipulation and Hygiene: Minimal assistance, Sitting/lateral lean Functional mobility during ADLs: Minimal assistance, Rolling walker (2 wheels) (approx 6' in room with RW, frequent vcs for technique/safety) General ADL Comments: further mobility limited on this date due to reported pain in L calf, RN in room and pt assisted to supine position     Mobility   Overal bed mobility: Needs Assistance Bed Mobility: Supine  to Sit Supine to sit: Contact guard Sit to supine: HOB elevated, Used rails, Mod assist General bed mobility comments: in recliner pre/post session     Transfers   Overall transfer level: Needs assistance Equipment used: Rolling walker (2 wheels) Transfers: Sit to/from Stand Sit to Stand: Contact guard assist Bed to/from chair/wheelchair/BSC transfer type:: Stand pivot Stand pivot transfers: Min assist General transfer comment: frequent multi modal cues for RW techinque     Ambulation / Gait / Stairs / Wheelchair Mobility   Ambulation/Gait Ambulation/Gait assistance: Contact guard assist Gait Distance (Feet): 100 Feet Assistive device: Rolling walker (2 wheels) Gait Pattern/deviations: Step-through pattern, Decreased step length - right, Decreased step length - left General Gait Details: distance is progressing daily with increased confidence and improving balance but gait remais slow and cautious Gait velocity: decreased     Posture / Balance Balance Overall balance assessment: Needs assistance Sitting-balance support: No upper extremity supported, Feet supported Sitting balance-Leahy Scale: Good Standing balance support: Bilateral upper extremity supported Standing balance-Leahy Scale: Good Standing balance comment: continues to need heavy support of walker with gait and reaching activities     Special needs/care consideration Chemo on hold until after CIR completion    Previous Home Environment  Living Arrangements: Other relatives (sister)  Lives With: Other (Comment) (sister) Available Help at Discharge: Family, Available 24 hours/day Type of Home: Other(Comment) (townhouse) Home Layout: 1/2 bath on main level, Two level Alternate Level Stairs-Rails: Right Alternate Level Stairs-Number of Steps: flight Home Access: Level entry Bathroom Shower/Tub: Health visitor: Standard Bathroom Accessibility: Yes How Accessible: Accessible via walker Home Care  Services: No Additional Comments: reports can sleep in recliner on 1st floor if needed; sister works outside of the home   Discharge Living Setting Plans for Discharge Living Setting: Patient's home Type of Home at Discharge: Other (Comment) (townhouse) Discharge Home Layout: 1/2 bath on main level, Two level Alternate Level Stairs-Rails: Right Alternate Level Stairs-Number of Steps: flight Discharge Home Access: Level entry Discharge Bathroom Shower/Tub: Walk-in shower Discharge Bathroom Toilet: Standard Discharge Bathroom Accessibility: Yes How Accessible: Accessible via walker Does the patient have any problems obtaining your medications?: No   Social/Family/Support Systems Anticipated Caregiver: Toren Sasser (sister) and other family Anticipated Caregiver's Contact Information: [phone] Caregiver Availability: 24/7 Discharge Plan Discussed with Primary  Caregiver: Yes Is Caregiver In Agreement with Plan?: Yes Does Caregiver/Family have Issues with Lodging/Transportation while Pt is in Rehab?: No   Brother and Aunt can assist when sister is at work   Goals Patient/Family Goal for Rehab: supervision with PT and OT Expected length of stay: ELOS 10 to 14 days Additional Information: Chemo on hol duntil after rehab per Oncology Pt/Family Agrees to Admission and willing to participate: Yes Program Orientation Provided & Reviewed with Pt/Caregiver Including Roles  & Responsibilities: Yes   Decrease burden of Care through IP rehab admission: NA   Possible need for SNF placement upon discharge: Not anticipated   Patient Condition: I have reviewed medical records from [facility], spoken with CM, and patient and family member. I discussed via phone for inpatient rehabilitation assessment.  Patient will benefit from ongoing PT and OT, can actively participate in 3 hours of therapy a day 5 days of the week, and can make measurable gains during the admission.   Patient will also benefit from the coordinated team approach during an Inpatient Acute Rehabilitation admission.  The patient will receive intensive therapy as well as Rehabilitation physician, nursing, social worker, and care management interventions.  Due to safety, skin/wound care, disease management, medication administration, pain management, and patient education the patient requires 24 hour a day rehabilitation nursing.  The patient is currently min assist overall with mobility and basic ADLs.  Discharge setting and therapy post discharge at home with home health is anticipated.  Patient has agreed to participate in the Acute Inpatient Rehabilitation Program and will admit today.   Preadmission Screen Completed By:  Ottie Glazier RN MSN 11/27/2022 10:17 AM ______________________________________________________________________   Discussed status with Dr. Natale Lay on 11/27/22 at 1017 and received approval for admission today.   Admission Coordinator:  Ottie Glazier RN MSN time 1017 Date 11/27/22    Assessment/Plan: Diagnosis: CVA Does the need for close, 24 hr/day Medical supervision in concert with the patient's rehab needs make it unreasonable for this patient to be served in a less intensive setting? Yes Co-Morbidities requiring supervision/potential complications: Afib, GE junction carcinoma, HLD, CHF, HTN, CKD 3b, DM2, low MG, Depression and anxeity, obesity Due to bladder management, bowel management, safety, skin/wound care, disease management, medication administration, pain management, and patient education, does the patient require 24 hr/day rehab nursing? Yes Does the patient require coordinated care of a physician, rehab nurse, PT, OT, and SLP to address physical and functional deficits in the context of the above medical diagnosis(es)? Yes Addressing deficits in the following areas: balance, endurance, locomotion, strength, transferring, bowel/bladder control, bathing, dressing,  feeding, grooming, toileting, and psychosocial support Can the patient actively participate in an intensive therapy program of at least 3 hrs of therapy 5 days a week? Yes The potential for patient to make measurable gains while on inpatient rehab is excellent Anticipated functional outcomes upon discharge from inpatient rehab: supervision PT, supervision OT, n/a SLP Estimated rehab length of stay to reach the above functional goals is: 10-14 Anticipated discharge destination: Home 10. Overall Rehab/Functional Prognosis: excellent     MD Signature: [name]          Revision History

## 2022-11-27 NOTE — Progress Notes (Signed)
Inpatient Rehabilitation Admissions Coordinator   I have [facility] bed available at [facility] for admit today. I spoke with patient and he is in agreement to admit today. He will be admitted to Room 4w22 and Dr Natale Lay will be admitting rehab MD today. Nurse to call report to is Amy [phone]. I await Dr Myriam Forehand final approval for d/c to CIR before contacting [facility] for transport.  Ottie Glazier, RN, MSN Rehab Admissions Coordinator [phone] 11/27/2022 10:11 AM

## 2022-11-27 NOTE — Progress Notes (Signed)
Occupational Therapy Treatment Patient Details Name: Jeremiah Vance MRN: [id number] DOB: [birth year] Today's Date: 11/27/2022   History of present illness Pt is a 52 year old male presenting to the ED with L sided numbness MRI of the brain with and without contrast showed subacute infarct in the left frontal operculum with petechial hemorrhage and mild surrounding edema. No significant mass effect or midline shift. MRA negative foir LVO. The MRI findings of left frontal operculum does not seem to explain his left-sided numbnes    PMH significant for GE junction carcinoma, HTN, HLD, DM, sCHF with EF 30-35%, stroke with mild left sided weakness, depression with anxiety, A-fib not on anticoagulants, obesity   OT comments  Upon entering the room, pt seated in recliner chair and agreeable to OT intervention. Plans for discharge to inpt rehab today from hospital and pt remains motivated. Pt needing min cuing for technique and hand placement with CGA to stand from recliner chair with RW. Pt ambulates with RW to sink for standing grooming tasks. Pt having difficulty using L UE to squeeze toothpaste and OT provided pt with suggestions on how to complete task himself. Pt ambulates in room with RW with CGA when phone begins to ring and pt becomes impulsive with movement to get to it and lets go of RW. However, he remains upright without LOB with CGA and places lunch order. Pt returning to recliner chair at end of session. Call bell and all needed items within reach.       If plan is discharge home, recommend the following:  A lot of help with walking and/or transfers;A lot of help with bathing/dressing/bathroom;Assistance with cooking/housework;Direct supervision/assist for medications management;Help with stairs or ramp for entrance;Direct supervision/assist for financial management;Assist for transportation;Supervision due to cognitive status   Equipment Recommendations  None recommended by OT        Precautions / Restrictions Precautions Precautions: Fall Restrictions Weight Bearing Restrictions: No       Mobility Bed Mobility               General bed mobility comments: in recliner pre/post session    Transfers Overall transfer level: Needs assistance Equipment used: Rolling walker (2 wheels) Transfers: Sit to/from Stand Sit to Stand: Contact guard assist                 Balance Overall balance assessment: Needs assistance Sitting-balance support: No upper extremity supported, Feet supported Sitting balance-Leahy Scale: Good     Standing balance support: Bilateral upper extremity supported Standing balance-Leahy Scale: Good                             ADL either performed or assessed with clinical judgement   ADL Overall ADL's : Needs assistance/impaired     Grooming: Wash/dry face;Supervision/safety;Standing;Contact guard assist;Oral care                                       Vision Patient Visual Report: No change from baseline;Other (comment)            Cognition Arousal: Alert Behavior During Therapy: WFL for tasks assessed/performed Overall Cognitive Status: No family/caregiver present to determine baseline cognitive functioning Area of Impairment: Orientation                   Current Attention Level: Sustained   Following Commands: Follows  multi-step commands consistently Safety/Judgement: Decreased awareness of safety, Decreased awareness of deficits Awareness: Emergent Problem Solving: Slow processing                     Pertinent Vitals/ Pain       Pain Assessment Pain Assessment: Faces Faces Pain Scale: Hurts a little bit Pain Location: BLEs Pain Descriptors / Indicators: Sore, Grimacing Pain Intervention(s): Monitored during session, Repositioned         Frequency  Min 1X/week        Progress Toward Goals  OT Goals(current goals can now be found in the care plan  section)  Progress towards OT goals: Progressing toward goals            AM-PAC OT "6 Clicks" Daily Activity     Outcome Measure   Help from another person eating meals?: A Little Help from another person taking care of personal grooming?: A Little Help from another person toileting, which includes using toliet, bedpan, or urinal?: A Little Help from another person bathing (including washing, rinsing, drying)?: A Little Help from another person to put on and taking off regular upper body clothing?: A Little Help from another person to put on and taking off regular lower body clothing?: A Little 6 Click Score: 18    End of Session Equipment Utilized During Treatment: Rolling walker (2 wheels)  OT Visit Diagnosis: Unsteadiness on feet (R26.81);Muscle weakness (generalized) (M62.81)   Activity Tolerance Patient tolerated treatment well   Patient Left in chair;with call bell/phone within reach;with chair alarm set   Nurse Communication Mobility status        Time: 1191-4782 OT Time Calculation (min): 19 min  Charges: OT General Charges $OT Visit: 1 Visit OT Treatments $Self Care/Home Management : 8-22 mins  Jackquline Denmark, MS, OTR/L , CBIS ascom [phone]  11/27/22, 12:32 PM

## 2022-11-27 NOTE — Discharge Instructions (Addendum)
Inpatient Rehab Discharge Instructions  Jeremiah Vance Discharge date and time: 12/07/2022   Activities/Precautions/ Functional Status: Activity: no lifting, driving, or strenuous exercise until cleared by MD Diet: diabetic diet Wound Care: regular skin checks Functional status:  ___ No restrictions     ___ Walk up steps independently _x__ 24/7 supervision/assistance   ___ Walk up steps with assistance ___ Intermittent supervision/assistance  ___ Bathe/dress independently ___ Walk with walker     _x__ Bathe/dress with assistance ___ Walk Independently    ___ Shower independently ___ Walk with assistance    ___ Shower with assistance __x_ No alcohol     ___ Return to work/school ________  Special Instructions: No driving, alcohol consumption or tobacco use.  Recommend daily BP measurement in same arm and record time of day. Bring this information with you to follow-up appointment with PCP.  COMMUNITY REFERRALS UPON DISCHARGE:    Home Health:   PT     OT                    Agency: [facility]  Phone: [phone]   Medical Equipment/Items Ordered: Bedside Commode                                                 Agency/Supplier: [employer] [phone]  STROKE/TIA DISCHARGE INSTRUCTIONS SMOKING Cigarette smoking nearly doubles your risk of having a stroke & is the single most alterable risk factor  If you smoke or have smoked in the last 12 months, you are advised to quit smoking for your health. Most of the excess cardiovascular risk related to smoking disappears within a year of stopping. Ask you doctor about anti-smoking medications [place] Quit Line: 1-800-QUIT NOW Free Smoking Cessation Classes [phone]  CHOLESTEROL Know your levels; limit fat & cholesterol in your diet  Lipid Panel     Component Value Date/Time   CHOL 120 11/22/2022 0844   CHOL 154 05/19/2020 1139   TRIG 58 11/22/2022 0844   HDL 45 11/22/2022 0844   HDL 39 (L) 05/19/2020 1139   CHOLHDL 2.7 11/22/2022 0844   VLDL  12 11/22/2022 0844   LDLCALC 63 11/22/2022 0844   LDLCALC 100 (H) 05/19/2020 1139     Many patients benefit from treatment even if their cholesterol is at goal. Goal: Total Cholesterol (CHOL) less than 160 Goal:  Triglycerides (TRIG) less than 150 Goal:  HDL greater than 40 Goal:  LDL (LDLCALC) less than 100   BLOOD PRESSURE American Stroke Association blood pressure target is less that 120/80 mm/Hg  Your discharge blood pressure is:  BP: 137/84 Monitor your blood pressure Limit your salt and alcohol intake Many individuals will require more than one medication for high blood pressure  DIABETES (A1c is a blood sugar average for last 3 months) Goal HGBA1c is under 7% (HBGA1c is blood sugar average for last 3 months)  Diabetes:   Lab Results  Component Value Date   HGBA1C 7.0 (H) 11/22/2022    Your HGBA1c can be lowered with medications, healthy diet, and exercise. Check your blood sugar as directed by your physician Call your physician if you experience unexplained or low blood sugars.  PHYSICAL ACTIVITY/REHABILITATION Goal is 30 minutes at least 4 days per week  Activity: Increase activity slowly, Therapies: Physical Therapy: Home Health and Occupational Therapy: Home Health Return to work: when cleared  by MD Activity decreases your risk of heart attack and stroke and makes your heart stronger.  It helps control your weight and blood pressure; helps you relax and can improve your mood. Participate in a regular exercise program. Talk with your doctor about the best form of exercise for you (dancing, walking, swimming, cycling).  DIET/WEIGHT Goal is to maintain a healthy weight  Your discharge diet is:  Diet Order             Diet Heart Fluid consistency: Thin  Diet effective now                   liquids Your height is:  Height: 5\' 11"  (180.3 cm) Your current weight is: Weight: 108.8 kg Your Body Mass Index (BMI) is:  BMI (Calculated): 33.46 Following the type of diet  specifically designed for you will help prevent another stroke. Your goal weight range is:   Your goal Body Mass Index (BMI) is 19-24. Healthy food habits can help reduce 3 risk factors for stroke:  High cholesterol, hypertension, and excess weight.  RESOURCES Stroke/Support Group:  Call [phone]   STROKE EDUCATION PROVIDED/REVIEWED AND GIVEN TO PATIENT Stroke warning signs and symptoms How to activate emergency medical system (call 911). Medications prescribed at discharge. Need for follow-up after discharge. Personal risk factors for stroke. Pneumonia vaccine given: No Flu vaccine given: No My questions have been answered, the writing is legible, and I understand these instructions.  I will adhere to these goals & educational materials that have been provided to me after my discharge from the hospital.      My questions have been answered and I understand these instructions. I will adhere to these goals and the provided educational materials after my discharge from the hospital.  Patient/Caregiver Signature _______________________________ Date __________  Clinician Signature _______________________________________ Date __________  Please bring this form and your medication list with you to all your follow-up doctor's appointments.   ===================================  Information on my medicine - ELIQUIS (apixaban)  This medication education was reviewed with me or my healthcare representative as part of my discharge preparation.  The pharmacist that spoke with me during my hospital stay was:  Milinda Antis, PA-C  Why was Eliquis prescribed for you? Eliquis was prescribed for you to reduce the risk of a blood clot forming that can cause a stroke if you have a medical condition called atrial fibrillation (a type of irregular heartbeat).  What do You need to know about Eliquis ? Take your Eliquis TWICE DAILY - one tablet in the morning and one tablet in the evening  with or without food. If you have difficulty swallowing the tablet whole please discuss with your pharmacist how to take the medication safely.  Take Eliquis exactly as prescribed by your doctor and DO NOT stop taking Eliquis without talking to the doctor who prescribed the medication.  Stopping may increase your risk of developing a stroke.  Refill your prescription before you run out.  After discharge, you should have regular check-up appointments with your healthcare provider that is prescribing your Eliquis.  In the future your dose may need to be changed if your kidney function or weight changes by a significant amount or as you get older.  What do you do if you miss a dose? If you miss a dose, take it as soon as you remember on the same day and resume taking twice daily.  Do not take more than one dose of ELIQUIS at  the same time to make up a missed dose.  Important Safety Information A possible side effect of Eliquis is bleeding. You should call your healthcare provider right away if you experience any of the following: Bleeding from an injury or your nose that does not stop. Unusual colored urine (red or dark brown) or unusual colored stools (red or black). Unusual bruising for unknown reasons. A serious fall or if you hit your head (even if there is no bleeding).  Some medicines may interact with Eliquis and might increase your risk of bleeding or clotting while on Eliquis. To help avoid this, consult your healthcare provider or pharmacist prior to using any new prescription or non-prescription medications, including herbals, vitamins, non-steroidal anti-inflammatory drugs (NSAIDs) and supplements.  This website has more information on Eliquis (apixaban): http://www.eliquis.com/eliquis/home  ==========================================  Atrial Fibrillation    Atrial fibrillation is a type of heartbeat that is irregular or fast. If you have this condition, your heart beats  without any order. This makes it hard for your heart to pump blood in a normal way. Atrial fibrillation may come and go, or it may become a long-lasting problem. If this condition is not treated, it can put you at higher risk for stroke, heart failure, and other heart problems.  What are the causes? This condition may be caused by diseases that damage the heart. They include: High blood pressure. Heart failure. Heart valve disease. Heart surgery. Other causes include: Diabetes. Thyroid disease. Being overweight. Kidney disease. Sometimes the cause is not known.  What increases the risk? You are more likely to develop this condition if: You are older. You smoke. You exercise often and very hard. You have a family history of this condition. You are a man. You use drugs. You drink a lot of alcohol. You have lung conditions, such as emphysema, pneumonia, or COPD. You have sleep apnea.  What are the signs or symptoms? Common symptoms of this condition include: A feeling that your heart is beating very fast. Chest pain or discomfort. Feeling short of breath. Suddenly feeling light-headed or weak. Getting tired easily during activity. Fainting. Sweating. In some cases, there are no symptoms.  How is this treated? Treatment for this condition depends on underlying conditions and how you feel when you have atrial fibrillation. They include: Medicines to: Prevent blood clots. Treat heart rate or heart rhythm problems. Using devices, such as a pacemaker, to correct heart rhythm problems. Doing surgery to remove the part of the heart that sends bad signals. Closing an area where clots can form in the heart (left atrial appendage). In some cases, your doctor will treat other underlying conditions.  Follow these instructions at home:  Medicines Take over-the-counter and prescription medicines only as told by your doctor. Do not take any new medicines without first talking to  your doctor. If you are taking blood thinners: Talk with your doctor before you take any medicines that have aspirin or NSAIDs, such as ibuprofen, in them. Take your medicine exactly as told by your doctor. Take it at the same time each day. Avoid activities that could hurt or bruise you. Follow instructions about how to prevent falls. Wear a bracelet that says you are taking blood thinners. Or, carry a card that lists what medicines you take. Lifestyle         Do not use any products that have nicotine or tobacco in them. These include cigarettes, e-cigarettes, and chewing tobacco. If you need help quitting, ask your doctor. Eat  heart-healthy foods. Talk with your doctor about the right eating plan for you. Exercise regularly as told by your doctor. Do not drink alcohol. Lose weight if you are overweight. Do not use drugs, including cannabis.  General instructions If you have a condition that causes breathing to stop for a short period of time (apnea), treat it as told by your doctor. Keep a healthy weight. Do not use diet pills unless your doctor says they are safe for you. Diet pills may make heart problems worse. Keep all follow-up visits as told by your doctor. This is important.  Contact a doctor if: You notice a change in the speed, rhythm, or strength of your heartbeat. You are taking a blood-thinning medicine and you get more bruising. You get tired more easily when you move or exercise. You have a sudden change in weight.  Get help right away if:    You have pain in your chest or your belly (abdomen). You have trouble breathing. You have side effects of blood thinners, such as blood in your vomit, poop (stool), or pee (urine), or bleeding that cannot stop. You have any signs of a stroke. "BE FAST" is an easy way to remember the main warning signs: B - Balance. Signs are dizziness, sudden trouble walking, or loss of balance. E - Eyes. Signs are trouble seeing or a  change in how you see. F - Face. Signs are sudden weakness or loss of feeling in the face, or the face or eyelid drooping on one side. A - Arms. Signs are weakness or loss of feeling in an arm. This happens suddenly and usually on one side of the body. S - Speech. Signs are sudden trouble speaking, slurred speech, or trouble understanding what people say. T - Time. Time to call emergency services. Write down what time symptoms started. You have other signs of a stroke, such as: A sudden, very bad headache with no known cause. Feeling like you may vomit (nausea). Vomiting. A seizure.  These symptoms may be an emergency. Do not wait to see if the symptoms will go away. Get medical help right away. Call your local emergency services (911 in the [place] Do not drive yourself to the hospital. Summary Atrial fibrillation is a type of heartbeat that is irregular or fast. You are at higher risk of this condition if you smoke, are older, have diabetes, or are overweight. Follow your doctor's instructions about medicines, diet, exercise, and follow-up visits. Get help right away if you have signs or symptoms of a stroke. Get help right away if you cannot catch your breath, or you have chest pain or discomfort. This information is not intended to replace advice given to you by your health care provider. Make sure you discuss any questions you have with your health care provider. Document Revised: 09/09/2018 Document Reviewed: 09/09/2018 Elsevier Patient Education  2020 [employer].

## 2022-11-28 DIAGNOSIS — I639 Cerebral infarction, unspecified: Secondary | ICD-10-CM | POA: Diagnosis not present

## 2022-11-28 LAB — COMPREHENSIVE METABOLIC PANEL
ALT: 13 U/L (ref 0–44)
AST: 15 U/L (ref 15–41)
Albumin: 2.4 g/dL — ABNORMAL LOW (ref 3.5–5.0)
Alkaline Phosphatase: 84 U/L (ref 38–126)
Anion gap: 11 (ref 5–15)
BUN: 26 mg/dL — ABNORMAL HIGH (ref 6–20)
CO2: 25 mmol/L (ref 22–32)
Calcium: 8.5 mg/dL — ABNORMAL LOW (ref 8.9–10.3)
Chloride: 101 mmol/L (ref 98–111)
Creatinine, Ser: 1.43 mg/dL — ABNORMAL HIGH (ref 0.61–1.24)
GFR, Estimated: 59 mL/min — ABNORMAL LOW (ref >60)
Glucose, Bld: 124 mg/dL — ABNORMAL HIGH (ref 70–99)
Potassium: 4.1 mmol/L (ref 3.5–5.1)
Sodium: 137 mmol/L (ref 135–145)
Total Bilirubin: 0.7 mg/dL (ref 0.3–1.2)
Total Protein: 6.1 g/dL — ABNORMAL LOW (ref 6.5–8.1)

## 2022-11-28 LAB — CBC WITH DIFFERENTIAL/PLATELET
Abs Immature Granulocytes: 0.03 10*3/uL (ref 0.00–0.07)
Basophils Absolute: 0 10*3/uL (ref 0.0–0.1)
Basophils Relative: 1 %
Eosinophils Absolute: 0.4 10*3/uL (ref 0.0–0.5)
Eosinophils Relative: 7 %
HCT: 30.4 % — ABNORMAL LOW (ref 39.0–52.0)
Hemoglobin: 9.5 g/dL — ABNORMAL LOW (ref 13.0–17.0)
Immature Granulocytes: 1 %
Lymphocytes Relative: 20 %
Lymphs Abs: 1.1 10*3/uL (ref 0.7–4.0)
MCH: 26 pg (ref 26.0–34.0)
MCHC: 31.3 g/dL (ref 30.0–36.0)
MCV: 83.1 fL (ref 80.0–100.0)
Monocytes Absolute: 1 10*3/uL (ref 0.1–1.0)
Monocytes Relative: 17 %
Neutro Abs: 3 10*3/uL (ref 1.7–7.7)
Neutrophils Relative %: 54 %
Platelets: 343 10*3/uL (ref 150–400)
RBC: 3.66 MIL/uL — ABNORMAL LOW (ref 4.22–5.81)
RDW: 18.7 % — ABNORMAL HIGH (ref 11.5–15.5)
WBC: 5.6 10*3/uL (ref 4.0–10.5)
nRBC: 0 % (ref 0.0–0.2)

## 2022-11-28 LAB — MAGNESIUM: Magnesium: 1.9 mg/dL (ref 1.7–2.4)

## 2022-11-28 LAB — GLUCOSE, CAPILLARY
Glucose-Capillary: 104 mg/dL — ABNORMAL HIGH (ref 70–99)
Glucose-Capillary: 87 mg/dL (ref 70–99)
Glucose-Capillary: 124 mg/dL — ABNORMAL HIGH (ref 70–99)
Glucose-Capillary: 110 mg/dL — ABNORMAL HIGH (ref 70–99)

## 2022-11-28 NOTE — Evaluation (Signed)
Physical Therapy Assessment and Plan  Patient Details  Name: Jeremiah Vance MRN: [id number] Date of Birth: [birth year]  PT Diagnosis: Abnormal posture, Abnormality of gait, Difficulty walking, Hemiparesis non-dominant, Muscle weakness, and Pain in chronic pain in many areas of body Rehab Potential: Good ELOS: 7-10 days   Today's Date: 11/28/2022 PT Individual Time: 7829-5621 PT Individual Time Calculation (min): 60 min    Hospital Problem: Principal Problem:   Ischemic cerebrovascular accident (CVA) of frontal lobe (HCC) Active Problems:   PAF (paroxysmal atrial fibrillation) (HCC)   Diabetes mellitus (HCC)   Class 1 obesity with serious comorbidity in adult   Benign essential HTN   Past Medical History:  Past Medical History:  Diagnosis Date   Acute ischemic stroke (HCC) 09/06/2017   Acute renal failure superimposed on stage 3a chronic kidney disease (HCC) 07/08/2019   Acute right-sided weakness    Allergies    Anasarca    Anemia 07/10/2017   Arthritis    Cancer (HCC)    stomach and esophageal cancer stage 4   CHF (congestive heart failure) (HCC)    "coinsided w/kidney problems I was having 06/2017"   Chicken pox    CKD (chronic kidney disease) stage 3, GFR 30-59 ml/min (HCC) 06/2017   Depression    Elevated troponin 07/08/2019   High cholesterol    History of cardiomyopathy    LVEF 40 to 45% in April 2019 - subsequently normalized   Hyperbilirubinemia 07/10/2017   Hypertension    Ischemic stroke (HCC)    Small left internal capsule infarct due to lacunar disease   Morbid obesity (HCC)    Normocytic anemia 12/16/2017   Recurrent incisional hernia with incarceration s/p repair 10/22/2017 10/21/2017   Stroke (HCC) 08/2017   "right sided weakness since; getting stronger though" (10/22/2017)   Type 2 diabetes mellitus (HCC)    Past Surgical History:  Past Surgical History:  Procedure Laterality Date   ABDOMINAL HERNIA REPAIR  2008; 10/22/2017   "scope; OPEN REPAIR  INCARCERATED VENTRAL HERNIA   ESOPHAGOGASTRODUODENOSCOPY (EGD) WITH PROPOFOL N/A 09/10/2022   Procedure: ESOPHAGOGASTRODUODENOSCOPY (EGD) WITH PROPOFOL;  Surgeon: Toney Reil, MD;  Location: [facility] ENDOSCOPY;  Service: Gastroenterology;  Laterality: N/A;   ESOPHAGOGASTRODUODENOSCOPY (EGD) WITH PROPOFOL N/A 09/28/2022   Procedure: ESOPHAGOGASTRODUODENOSCOPY (EGD) WITH PROPOFOL;  Surgeon: Jaynie Collins, DO;  Location: [facility] ENDOSCOPY;  Service: Gastroenterology;  Laterality: N/A;   HEMOSTASIS CONTROL  09/28/2022   Procedure: HEMOSTASIS CONTROL;  Surgeon: Jaynie Collins, DO;  Location: [facility] ENDOSCOPY;  Service: Gastroenterology;;   HERNIA REPAIR     KNEE ARTHROSCOPY Right 1989   PORTA CATH INSERTION N/A 09/25/2022   Procedure: PORTA CATH INSERTION;  Surgeon: Annice Needy, MD;  Location: [facility] INVASIVE CV LAB;  Service: Cardiovascular;  Laterality: N/A;   VENTRAL HERNIA REPAIR N/A 10/22/2017   Procedure: OPEN REPAIR INCARCERATED VENTRAL HERNIA;  Surgeon: Glenna Fellows, MD;  Location: [facility] OR;  Service: General;  Laterality: N/A;    Assessment & Plan Clinical Impression: Patient is a 52 y.o.  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  Per chart review patient lives with his sister.  Two-level home with bed and bath upstairs.  Modified independent with rolling walker outside the home, furniture cruises within the house.  Patient does not drive.  Presented to  [facility]/21/2022 with left-sided numbness and increasing weakness.  Initial cranial CT scan showed compared to prior exam no new cytotoxic edema in the left frontal lobe with associated blood products.  No definite evidence of parenchymal hematoma.  MRI showed subacute infarct left frontal operculum with petechial hemorrhage and mild surrounding edema.  No significant mass effect or midline shift.  MRA identified no large vessel occlusion or stenosis.  Admission chemistries unremarkable except glucose 157 BUN 41 creatinine 1.51, hemoglobin 10.4, BNP 239, hemoglobin A1c 7.0.  Echocardiogram with ejection fraction of 55 to 60% no wall motion abnormalities.  Patient did not receive TNK.  Neurology service follow-up he was cleared to begin Eliquis for CVA prophylaxis as well as history of atrial fibrillation.  Hospital course follow-up oncology services Dr.Yu for GE junction carcinoma and chemotherapy currently on hold follow-up outpatient.  Patient reports chronic pain in his lower extremities left greater than right.  He has chronically limited vision in his left eye.  Patient tolerating a regular consistency diet.  Therapy evaluations completed due to patient's decreased functional mobility increasing left-sided weakness and numbness was admitted for a comprehensive rehab program. Patient transferred to CIR on 11/27/2022 .   Patient currently requires min with mobility secondary to muscle weakness, decreased cardiorespiratoy endurance, unbalanced muscle activation, decreased visual acuity, and decreased standing balance, decreased postural control, and decreased balance strategies.  Prior to hospitalization, patient was modified independent  with mobility and lived with Family (sister) in a House (townhouse) home.  Home access is 1 small step-up into houseOther (comment).  Patient will benefit from skilled PT intervention to maximize safe functional mobility, minimize fall risk, and decrease caregiver burden for  planned discharge home with intermittent assist.  Anticipate patient will benefit from follow up [facility] at discharge.  PT - End of Session Activity Tolerance: Tolerates 30+ min activity without fatigue Endurance Deficit: Yes Endurance Deficit Description: requries seated rest breaks PT Assessment Rehab Potential (ACUTE/IP ONLY): Good PT Barriers to Discharge: Decreased caregiver support;Home environment access/layout;Pending chemo/radiation PT Patient demonstrates impairments in the following area(s): Balance;Safety;Edema;Skin Integrity;Endurance;Motor;Nutrition;Pain PT Transfers Functional Problem(s): Bed Mobility;Bed to Chair;Car;Furniture;Floor PT Locomotion Functional Problem(s): Ambulation;Stairs PT Plan PT Intensity: Minimum of 1-2 x/day ,45 to 90 minutes PT Frequency: 5 out of 7 days PT Duration Estimated Length of Stay: 7-10 days PT Treatment/Interventions: Ambulation/gait training;Cognitive remediation/compensation;Discharge planning;DME/adaptive equipment instruction;Functional mobility training;Pain management;Psychosocial support;Splinting/orthotics;Therapeutic Activities;UE/LE Strength taining/ROM;Visual/perceptual remediation/compensation;Balance/vestibular training;Community reintegration;Functional electrical stimulation;Disease management/prevention;Neuromuscular re-education;Patient/family education;Skin care/wound management;Stair training;Therapeutic Exercise;UE/LE Coordination activities PT Transfers Anticipated Outcome(s): mod-I PT Locomotion Anticipated Outcome(s): mod-I household level PT Recommendation Recommendations for Other Services: Therapeutic Recreation consult Therapeutic Recreation Interventions: Outing/community reintergration Follow Up Recommendations: Home health PT;24 hour supervision/assistance Patient destination: Home Equipment Recommended: To be determined   PT Evaluation Precautions/Restrictions Precautions Precautions: Fall;Other  (comment) Precaution Comments: legally blind in L eye, 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 Rarely or not at all Pain Interference with Therapy Activities: 2. Occasionally ("  I delt with it") Pain Interference with Day-to-Day Activities: 1. Rarely or not at all ("I deal with it...just go with the flow") Home Living/Prior Functioning Home Living Available Help at Discharge: Family;Available PRN/intermittently (sister works full time; reports if he needs support he has family/friends that can support him) Type of Home: House (townhouse) Home Access: Other (comment) Entrance Stairs-Number of Steps: 1 small step-up into house Home Layout: Two level;1/2 bath on main level;Bed/bath upstairs Alternate Level Stairs-Number of Steps: flight Alternate Level Stairs-Rails: Right Additional Comments: reports RW doesn't work well in house due to limited space so he furtniture walks and then uses RW when outside the home; goes out of home at least 1x/week; only does stair navigation when sister at home for safety but she does not physically help him  Lives With: Family (sister) Prior Function Level of Independence: Independent with gait;Independent with transfers;Other (comment)  Able to Take Stairs?: Yes (reports he goes downstairs in AM when his sister is home and then stays down there until she returns) Driving: No Vision/Perception  Vision - History Ability to See in Adequate Light: 1 Impaired Perception Perception:  Impaired Perception-Other Comments: impaired depth perception Praxis Praxis: WFL  Cognition Overall Cognitive Status: Within Functional Limits for tasks assessed Arousal/Alertness: Awake/alert Orientation Level: Oriented X4 Year: 2024 Month: August Day of Week: Correct Attention: Focused;Sustained Focused Attention: Appears intact Sustained Attention: Appears intact Safety/Judgment: Appears intact Sensation Sensation Light Touch: Appears Intact Hot/Cold: Not tested Proprioception: Appears Intact Stereognosis: Not tested Coordination Gross Motor Movements are Fluid and Coordinated: No Coordination and Movement Description: generalized weakness and deconditioning wtih mild L hemiparesis compared to R; impaired balance Motor  Motor Motor: Hemiplegia Motor - Skilled Clinical Observations: generalized weakness and deconditioning wtih mild L hemiparesis compared to R; impaired balance   Trunk/Postural Assessment  Cervical Assessment Cervical Assessment: Exceptions to [facility] (mild forward head) Thoracic Assessment Thoracic Assessment: Exceptions to [facility] (mild rounded shoulders) Lumbar Assessment Lumbar Assessment: Within Functional Limits Postural Control Postural Control: Deficits on evaluation (requireds RW for support during ambulation)  Balance Balance Balance Assessed: Yes Static Sitting Balance Static Sitting - Balance Support: Feet supported Static Sitting - Level of Assistance: 5: Stand by assistance Dynamic Sitting Balance Dynamic Sitting - Balance Support: Feet supported Dynamic Sitting - Level of Assistance: 5: Stand by assistance Static Standing Balance Static Standing - Balance Support: During functional activity;Bilateral upper extremity supported Static Standing - Level of Assistance: 5: Stand by assistance;Other (comment) (CGA) Dynamic Standing Balance Dynamic Standing - Balance Support: During functional activity;Bilateral upper extremity supported Dynamic  Standing - Level of Assistance: 4: Min assist;Other (comment) (CGA) Extremity Assessment      RLE Assessment RLE Assessment: Exceptions to [facility] Active Range of Motion (AROM) Comments: WFL/WNL General Strength Comments: assessed in sitting RLE Strength Right Hip Flexion: 4/5 Right Knee Flexion: 4/5 Right Knee Extension: 4+/5 Right Ankle Dorsiflexion: 4-/5 (some foot pain when resistance applied) Right Ankle Plantar Flexion: 3+/5 LLE Assessment LLE Assessment: Exceptions to [facility] Active Range of Motion (AROM) Comments: WFL General Strength Comments: assessed in sitting with ability to provide resistance limited primarily due to pain LLE Strength Left Hip Flexion: 3/5 Left Knee Flexion: 3/5 Left Knee Extension: 3/5 Left Ankle Dorsiflexion: 3/5 Left Ankle Plantar Flexion: 3/5  Care Tool Care Tool Bed Mobility Roll left and right activity   Roll left and right assist level: Independent with assistive device    Sit to lying activity   Sit to lying assist level: Supervision/Verbal cueing  Lying to sitting on side of bed activity   Lying to sitting on side of bed assist level: the ability to move from lying on the back to sitting on the side of the bed with no back support.: Supervision/Verbal cueing     Care Tool Transfers Sit to stand transfer   Sit to stand assist level: Contact Guard/Touching assist Sit to stand assistive device: Walker  Chair/bed transfer   Chair/bed transfer assist level: Contact Guard/Touching assist Chair/bed transfer assistive device: Administrator, sports transfer assist level: Minimal Assistance - Patient > 75% Architect Comment: RW    Care Tool Locomotion Ambulation   Assist level: Contact Guard/Touching assist Assistive device: Walker-rolling Max distance: 51ft  Walk 10 feet activity   Assist level: Contact Guard/Touching assist Assistive device: Walker-rolling   Walk 50 feet with 2 turns  activity   Assist level: Contact Guard/Touching assist Assistive device: Walker-rolling  Walk 150 feet activity Walk 150 feet activity did not occur: Safety/medical concerns      Walk 10 feet on uneven surfaces activity Walk 10 feet on uneven surfaces activity did not occur: Safety/medical concerns (fatigue)      Stairs   Assist level: Contact Guard/Touching assist Stairs assistive device: 2 hand rails Max number of stairs: 8  Walk up/down 1 step activity   Walk up/down 1 step (curb) assist level: Contact Guard/Touching assist Walk up/down 1 step or curb assistive device: 2 hand rails  Walk up/down 4 steps activity   Walk up/down 4 steps assist level: Contact Guard/Touching assist Walk up/down 4 steps assistive device: 2 hand rails  Walk up/down 12 steps activity Walk up/down 12 steps activity did not occur: Safety/medical concerns      Pick up small objects from floor   Pick up small object from the floor assist level: Minimal Assistance - Patient > 75% Pick up small object from the floor assistive device: RW  Wheelchair Is the patient using a wheelchair?: Yes Type of Wheelchair: Manual (for transport)   Wheelchair assist level: Dependent - Patient 0%    Wheel 50 feet with 2 turns activity   Assist Level: Dependent - Patient 0%  Wheel 150 feet activity   Assist Level: Dependent - Patient 0%    Refer to Care Plan for Long Term Goals  SHORT TERM GOAL WEEK 1 PT Short Term Goal 1 (Week 1): = to LTGs based on ELOS  Recommendations for other services: Therapeutic Recreation  Outing/community reintegration  Skilled Therapeutic Intervention Pt received sitting on EOB stating he is tired of being in the bed and is eager to participate in therapy session. Evaluation completed (see details above) with patient education regarding purpose of PT evaluation, PT POC and goals, therapy schedule, weekly team meetings, and other CIR information including safety plan and fall risk safety. Pt  performed the below functional mobility tasks with the specified levels of skilled cuing and assistance.  Pt only has sandals available to wear at this time - planning to ask his sister to bring in sneakers per therapist request. Educated pt on importance of wearing supportive and fully covering shoes to protect his feet and provide support for balance. Pt reports he furniture walks in house due to not having space for RW. Throughout session pt reports his 2 primary goals for therapy are to address his balance and his endurance. At end of session performed gait training ~40ft in  room using L HHA with Min A for balance due to instability and pt having impaired depth perception to determine how close items are to support him during gait in this unfamiliar environment. At end of session, pt left seated in recliner with needs in reach and seat belt alarm on.  Mobility Bed Mobility Bed Mobility: Supine to Sit;Sit to Supine Supine to Sit: Supervision/Verbal cueing Sit to Supine: Supervision/Verbal cueing Transfers Transfers: Sit to Stand;Stand to Sit;Stand Pivot Transfers Sit to Stand: Contact Guard/Touching assist Stand to Sit: Contact Guard/Touching assist Stand Pivot Transfers: Contact Guard/Touching assist Stand Pivot Transfer Details: Verbal cues for safe use of DME/AE;Verbal cues for technique;Verbal cues for sequencing;Verbal cues for precautions/safety Transfer (Assistive device): Rolling walker (and armrests) Locomotion  Gait Ambulation: Yes Gait Assistance: Contact Guard/Touching assist Gait Distance (Feet): 50 Feet (x2) Assistive device: Rolling walker Gait Assistance Details: Verbal cues for precautions/safety;Verbal cues for safe use of DME/AE;Verbal cues for gait pattern;Verbal cues for technique;Verbal cues for sequencing;Tactile cues for sequencing;Tactile cues for posture;Tactile cues for weight shifting Gait Gait: Yes Gait Pattern: Impaired Gait Pattern: Decreased step length -  right;Decreased step length - left;Poor foot clearance - left;Poor foot clearance - right Gait velocity: decreased, but pt reports this is his baseline speed Stairs / Additional Locomotion Stairs: Yes Stairs Assistance: Contact Guard/Touching assist Stair Management Technique: Two rails;One rail Right;Step to pattern;Sideways;Forwards (forwards with 2 rails and then sideways using only R HR to simulate home set-up) Number of Stairs: 8 Height of Stairs: 6 Wheelchair Mobility Wheelchair Mobility: No   Discharge Criteria: Patient will be discharged from PT if patient refuses treatment 3 consecutive times without medical reason, if treatment goals not met, if there is a change in medical status, if patient makes no progress towards goals or if patient is discharged from hospital.  The above assessment, treatment plan, treatment alternatives and goals were discussed and mutually agreed upon: by patient  Ginny Forth , PT, DPT, NCS, CSRS 11/28/2022, 9:43 AM

## 2022-11-28 NOTE — Progress Notes (Signed)
Slept well last night. Denies pain. One assist to bathroom with front wheel walker. Safety maintained at all times.

## 2022-11-28 NOTE — Plan of Care (Signed)
  Problem: RH Balance Goal: LTG Patient will maintain dynamic standing with ADLs (OT) Description: LTG:  Patient will maintain dynamic standing balance with assist during activities of daily living (OT)  Flowsheets (Taken 11/28/2022 1218) LTG: Pt will maintain dynamic standing balance during ADLs with: Independent with assistive device   Problem: Sit to Stand Goal: LTG:  Patient will perform sit to stand in prep for activites of daily living with assistance level (OT) Description: LTG:  Patient will perform sit to stand in prep for activites of daily living with assistance level (OT) Flowsheets (Taken 11/28/2022 1218) LTG: PT will perform sit to stand in prep for activites of daily living with assistance level: Independent with assistive device   Problem: RH Bathing Goal: LTG Patient will bathe all body parts with assist levels (OT) Description: LTG: Patient will bathe all body parts with assist levels (OT) Flowsheets (Taken 11/28/2022 1218) LTG: Pt will perform bathing with assistance level/cueing: Supervision/Verbal cueing LTG: Position pt will perform bathing: Shower   Problem: RH Dressing Goal: LTG Patient will perform upper body dressing (OT) Description: LTG Patient will perform upper body dressing with assist, with/without cues (OT). Flowsheets (Taken 11/28/2022 1218) LTG: Pt will perform upper body dressing with assistance level of: Independent with assistive device Goal: LTG Patient will perform lower body dressing w/assist (OT) Description: LTG: Patient will perform lower body dressing with assist, with/without cues in positioning using equipment (OT) Flowsheets (Taken 11/28/2022 1218) LTG: Pt will perform lower body dressing with assistance level of: Independent with assistive device   Problem: RH Toileting Goal: LTG Patient will perform toileting task (3/3 steps) with assistance level (OT) Description: LTG: Patient will perform toileting task (3/3 steps) with assistance level  (OT)  Flowsheets (Taken 11/28/2022 1218) LTG: Pt will perform toileting task (3/3 steps) with assistance level: Independent with assistive device   Problem: RH Functional Use of Upper Extremity Goal: LTG Patient will use RT/LT upper extremity as a (OT) Description: LTG: Patient will use right/left upper extremity as a stabilizer/gross assist/diminished/nondominant/dominant level with assist, with/without cues during functional activity (OT) Flowsheets (Taken 11/28/2022 1218) LTG: Use of upper extremity in functional activities: LUE as dominant level   Problem: RH Simple Meal Prep Goal: LTG Patient will perform simple meal prep w/assist (OT) Description: LTG: Patient will perform simple meal prep with assistance, with/without cues (OT). Flowsheets (Taken 11/28/2022 1218) LTG: Pt will perform simple meal prep with assistance level of: Supervision/Verbal cueing   Problem: RH Laundry Goal: LTG Patient will perform laundry w/assist, cues (OT) Description: LTG: Patient will perform laundry with assistance, with/without cues (OT). Flowsheets (Taken 11/28/2022 1218) LTG: Pt will perform laundry with assistance level of: Supervision/Verbal cueing   Problem: RH Toilet Transfers Goal: LTG Patient will perform toilet transfers w/assist (OT) Description: LTG: Patient will perform toilet transfers with assist, with/without cues using equipment (OT) Flowsheets (Taken 11/28/2022 1218) LTG: Pt will perform toilet transfers with assistance level of: Independent with assistive device   Problem: RH Tub/Shower Transfers Goal: LTG Patient will perform tub/shower transfers w/assist (OT) Description: LTG: Patient will perform tub/shower transfers with assist, with/without cues using equipment (OT) Flowsheets (Taken 11/28/2022 1218) LTG: Pt will perform tub/shower stall transfers with assistance level of: Supervision/Verbal cueing LTG: Pt will perform tub/shower transfers from: Walk in shower

## 2022-11-28 NOTE — Progress Notes (Signed)
PROGRESS NOTE   Subjective/Complaints:  No issues overnite , discussed leg swelling , has worn comp hose in hospital, unable to donn independently at home , used walker and did furniture walking PTA   ROS- neg for CP, SOB, N/V/D, denies swallowing issues   Objective:   No results found. Recent Labs    11/27/22 0447 11/28/22 0431  WBC 6.3 5.6  HGB 9.5* 9.5*  HCT 29.9* 30.4*  PLT 314 343   Recent Labs    11/27/22 0447 11/28/22 0431  NA 138 137  K 4.2 4.1  CL 102 101  CO2 25 25  GLUCOSE 106* 124*  BUN 31* 26*  CREATININE 1.37* 1.43*  CALCIUM 8.4* 8.5*    Intake/Output Summary (Last 24 hours) at 11/28/2022 8295 Last data filed at 11/27/2022 1813 Gross per 24 hour  Intake 720 ml  Output --  Net 720 ml        Physical Exam: Vital Signs Blood pressure (!) 141/90, pulse 74, temperature 98 F (36.7 C), temperature source Oral, resp. rate 16, height 5\' 11"  (1.803 m), weight 109.8 kg, SpO2 97%.   General: No acute distress Mood and affect are appropriate Heart: Regular rate and rhythm no rubs murmurs or extra sounds Lungs: Clear to auscultation, breathing unlabored, no rales or wheezes Abdomen: Positive bowel sounds, soft nontender to palpation, nondistended Extremities: No clubbing, cyanosis, or edema Skin: No evidence of breakdown, no evidence of rash Neurologic: Cranial nerves II through XII intact, motor strength is 3-/5 in left and 4/5 right  deltoid, bicep, tricep, grip, hip flexor, knee extensors, ankle dorsiflexor and plantar flexor Sensory exam normal sensation to light touch  in bilateral upper and lower extremities, although some word finding issues limit exam  Pt unable to name body parts accurately  Cerebellar exam normal finger to nose to finger as well as heel to shin in bilateral upper and lower extremities Musculoskeletal: Full range of motion in all 4 extremities. No joint  swelling   Assessment/Plan: 1. Functional deficits which require 3+ hours per day of interdisciplinary therapy in a comprehensive inpatient rehab setting. Physiatrist is providing close team supervision and 24 hour management of active medical problems listed below. Physiatrist and rehab team continue to assess barriers to discharge/monitor patient progress toward functional and medical goals  Care Tool:  Bathing              Bathing assist       Upper Body Dressing/Undressing Upper body dressing        Upper body assist      Lower Body Dressing/Undressing Lower body dressing            Lower body assist       Toileting Toileting    Toileting assist       Transfers Chair/bed transfer  Transfers assist           Locomotion Ambulation   Ambulation assist              Walk 10 feet activity   Assist           Walk 50 feet activity   Assist  Walk 150 feet activity   Assist           Walk 10 feet on uneven surface  activity   Assist           Wheelchair     Assist               Wheelchair 50 feet with 2 turns activity    Assist            Wheelchair 150 feet activity     Assist          Blood pressure (!) 141/90, pulse 74, temperature 98 F (36.7 C), temperature source Oral, resp. rate 16, height 5\' 11"  (1.803 m), weight 109.8 kg, SpO2 97%.  Medical Problem List and Plan: 1. 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 Pre existing)              -patient may shower             -ELOS/Goals: Supervision with PT and OT, 10 to 14 days             -Admit to CIR 2.  Antithrombotics: -DVT/anticoagulation:  Pharmaceutical: Eliquis             -antiplatelet therapy:  N/A 3. Pain Management: Neurontin 100 mg twice daily, oxycodone as needed             -Increase Neurontin to 200mg  BID 4. Mood/Behavior/Sleep: Zoloft 50 mg daily, Lamictal 25 mg nightly, BuSpar 7.5 mg twice daily             -antipsychotic agents: N/A 5. Neuropsych/cognition: This patient is capable of making decisions on his own behalf. 6. Skin/Wound Care: Routine skin checks 7. Fluids/Electrolytes/Nutrition: Routine in and outs with follow-up chemistries 8.  Paroxysmal atrial fibrillation.  Continue Eliquis 2.5 mg twice daily.  Cardiac rate controlled 9.  GE junction carcinoma stage IV.  Chemotherapy per Dr.Yu.  Chemotherapy currently on hold follow-up outpatient 10.  Chronic combined systolic and diastolic congestive heart failure.  Continue Demadex 40 mg daily as needed.  Monitor for any signs of fluid overload.  Daily weights 11.  Diabetes mellitus.  Latest hemoglobin A1c 7.0.  Currently on SSI.  Patient on Farxiga 10 mg daily prior to admission.  Resume as needed 12.  Obesity.  BMI 35.11.  Dietary follow-up 13.  CKD stage IIIb.  Follow-up chemistries 14.  Hyperlipidemia.  Lipitor 15.  Hypomagnesemia.  Recheck levels 16.  Chronic anemia.  Recheck labs tomorrow 17. Obesity. Dietary counseling Body mass index is 33.76 kg/m. 18.  Essential hypertension.  Continue torsemide    LOS: 1 days A FACE TO FACE EVALUATION WAS PERFORMED  Erick Colace 11/28/2022, 8:22 AM

## 2022-11-28 NOTE — Progress Notes (Signed)
Physical Therapy Session Note  Patient Details  Name: Jeremiah Vance MRN: [id number] Date of Birth: 02-20-1971  Today's Date: 11/28/2022 PT Individual Time: 6433-2951 PT Individual Time Calculation (min): 76 min   Short Term Goals: Week 1:  PT Short Term Goal 1 (Week 1): = to LTGs based on ELOS  Skilled Therapeutic Interventions/Progress Updates:    Pt received sitting in recliner and agreeable to therapy session. Sit>stand recliner>L HHA with CGA for steadying. Gait training ~34ft from recliner>w/c using L HHA to practice as pt reports the RW does not fit in his home so he furniture walks, requiring min A for stability/balance - due to pt's visual deficits it is difficult for him to gauge distances from items he could use for support (tries to reach towards window-seal not realizing it is further away than he anticipated).   Transported to/from gym in w/c for time management and energy conservation. Provided pt with w/c cushion for improved skin protection. Simulated ambulatory car transfer (SUV height) using RW with CGA for steadying - pt with improved ability to maneuver this afternoon compared to AM. Gait training ~41ft up/down ramp using RW with CGA for steadying - pt with slow gait speed and short step lengths.  Gait training ~38ft x2 (progressed to adding 2 turns through doorways on 2nd trial) + ~34ft using L HHA with min A for balance - continues to have slower gait speed and increased balance instability compared to when using RW, but improving with repetition; decreased step lengths bilaterally and slightly wider BOS for stability.  Pt's planning to ask his sister to bring tennis shoes for him to use with therapy on Saturday because pt currently only has sandals.  Stair navigation training ascending/descending 8 steps (6" height) using R HR only to simulate home set-up with CGA - performed side step technique with B UE support on R HR leading with L LE on ascent and R LE on  descent.  Participated in Timed Up and Go (TUG) : 1st trial: 24.13 seconds using L HHA providing min A for balance  2nd trial: 21.13 seconds without UE support and CGA/light min A for steadying 3rd trial: 22.82 seconds using RW with close SBA for safety Patient demonstrates high fall risk as indicated by requiring >13.5seconds to complete the TUG.    Gait training ~68ft, no UE support, with light min A for balance instability - guarded upper body and trunk posturing with lack of trunk rotation and arm swing, increased time in double limb support before initiating next step to regain balance each time, slow gait speed and slightly wider BOS.  Dynamic balance task of placing clothespins from low (bench) to high (line on mirror) surface using LUE for NMR and strengthening - requires CGA progressed to light min A towards end due to minor fatigue with slight anterior lean.   Transported back to room. Gait training in/out bathroom, no AD but slightly furniture walking to prepare for how pt performs at home, with CGA/light min A for steadying as needed. Sit<>stands to/from toilet using grab bars with CGA. Managed LB clothing without assist and continent of bladder.  At end of session, pt left seated in recliner with needs in reach and seat belt alarm on.    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 Provided seated rest breaks for pain management throughout session.  Balance: Standardized Balance Assessment Standardized Balance Assessment: Timed Up and Go Test Timed Up and Go Test TUG: Normal TUG Normal TUG (seconds): 22.82 (using RW)   Therapy/Group: Individual Therapy  Ginny Forth , PT, DPT, NCS, CSRS 11/28/2022, 3:33 PM

## 2022-11-28 NOTE — Evaluation (Signed)
Occupational Therapy Assessment and Plan  Patient Details  Name: Jeremiah Vance MRN: [id number] Date of Birth: [birth year]  OT Diagnosis: acute pain, hemiplegia affecting non-dominant side, muscle weakness (generalized), and swelling of limb Rehab Potential: Rehab Potential (ACUTE ONLY): Good ELOS: 5-7 days   Today's Date: 11/28/2022 OT Individual Time: 1045-1200 OT Individual Time Calculation (min): 75 min     Hospital Problem: Principal Problem:   Ischemic cerebrovascular accident (CVA) of frontal lobe (HCC) Active Problems:   PAF (paroxysmal atrial fibrillation) (HCC)   Diabetes mellitus (HCC)   Class 1 obesity with serious comorbidity in adult   Benign essential HTN   Past Medical History:  Past Medical History:  Diagnosis Date   Acute ischemic stroke (HCC) 09/06/2017   Acute renal failure superimposed on stage 3a chronic kidney disease (HCC) 07/08/2019   Acute right-sided weakness    Allergies    Anasarca    Anemia 07/10/2017   Arthritis    Cancer (HCC)    stomach and esophageal cancer stage 4   CHF (congestive heart failure) (HCC)    "coinsided w/kidney problems I was having 06/2017"   Chicken pox    CKD (chronic kidney disease) stage 3, GFR 30-59 ml/min (HCC) 06/2017   Depression    Elevated troponin 07/08/2019   High cholesterol    History of cardiomyopathy    LVEF 40 to 45% in April 2019 - subsequently normalized   Hyperbilirubinemia 07/10/2017   Hypertension    Ischemic stroke (HCC)    Small left internal capsule infarct due to lacunar disease   Morbid obesity (HCC)    Normocytic anemia 12/16/2017   Recurrent incisional hernia with incarceration s/p repair 10/22/2017 10/21/2017   Stroke (HCC) 08/2017   "right sided weakness since; getting stronger though" (10/22/2017)   Type 2 diabetes mellitus [facility])    Past Surgical History:  Past Surgical History:  Procedure Laterality Date   ABDOMINAL HERNIA REPAIR  2008; 10/22/2017   "scope; OPEN REPAIR INCARCERATED  VENTRAL HERNIA   ESOPHAGOGASTRODUODENOSCOPY (EGD) WITH PROPOFOL N/A 09/10/2022   Procedure: ESOPHAGOGASTRODUODENOSCOPY (EGD) WITH PROPOFOL;  Surgeon: Toney Reil, MD;  Location: [facility] ENDOSCOPY;  Service: Gastroenterology;  Laterality: N/A;   ESOPHAGOGASTRODUODENOSCOPY (EGD) WITH PROPOFOL N/A 09/28/2022   Procedure: ESOPHAGOGASTRODUODENOSCOPY (EGD) WITH PROPOFOL;  Surgeon: Jaynie Collins, DO;  Location: [facility] ENDOSCOPY;  Service: Gastroenterology;  Laterality: N/A;   HEMOSTASIS CONTROL  09/28/2022   Procedure: HEMOSTASIS CONTROL;  Surgeon: Jaynie Collins, DO;  Location: [facility] ENDOSCOPY;  Service: Gastroenterology;;   HERNIA REPAIR     KNEE ARTHROSCOPY Right 1989   PORTA CATH INSERTION N/A 09/25/2022   Procedure: PORTA CATH INSERTION;  Surgeon: Annice Needy, MD;  Location: [facility] INVASIVE CV LAB;  Service: Cardiovascular;  Laterality: N/A;   VENTRAL HERNIA REPAIR N/A 10/22/2017   Procedure: OPEN REPAIR INCARCERATED VENTRAL HERNIA;  Surgeon: Glenna Fellows, MD;  Location: [facility] OR;  Service: General;  Laterality: N/A;    Assessment & Plan Clinical Impression: MRI showed subacute infarct left frontal operculum with petechial hemorrhage and mild surrounding edema.  Patient transferred to CIR on 11/27/2022 .    Patient currently requires  CGA  with basic self-care skills secondary to muscle weakness, decreased cardiorespiratoy endurance, decreased coordination, decreased depth perception, central origin, and decreased standing balance, decreased postural control, hemiplegia, and decreased balance strategies.  Prior to hospitalization, patient could complete BADLs with modified independent .  Patient will benefit from skilled intervention to decrease level of assist with basic self-care skills, increase  independence with basic self-care skills, and increase level of independence with iADL prior to discharge home with care partner.  Anticipate patient will require intermittent supervision  and  follow-up OT services to be determined .  OT - End of Session Activity Tolerance: Decreased this session Endurance Deficit: Yes OT Assessment Rehab Potential (ACUTE ONLY): Good OT Patient demonstrates impairments in the following area(s): Balance;Edema;Endurance;Motor;Pain;Skin Integrity;Sensory OT Basic ADL's Functional Problem(s): Toileting;Dressing;Bathing OT Advanced ADL's Functional Problem(s): Laundry;Simple Meal Preparation OT Transfers Functional Problem(s): Tub/Shower;Toilet OT Additional Impairment(s): Fuctional Use of Upper Extremity OT Plan OT Intensity: Minimum of 1-2 x/day, 45 to 90 minutes OT Frequency: 5 out of 7 days OT Duration/Estimated Length of Stay: 5-7 days OT Treatment/Interventions: Balance/vestibular training;Self Care/advanced ADL retraining;UE/LE Coordination activities;Cognitive remediation/compensation;Functional mobility training;Skin care/wound managment;Community reintegration;Neuromuscular re-education;Discharge planning;Therapeutic Activities;Pain management;Disease mangement/prevention;Patient/family education;Therapeutic Exercise;DME/adaptive equipment instruction;Psychosocial support;UE/LE Strength taining/ROM;Visual/perceptual remediation/compensation OT Self Feeding Anticipated Outcome(s): Mod I OT Basic Self-Care Anticipated Outcome(s): Mod I-supervision OT Toileting Anticipated Outcome(s): Mod I-supervision OT Bathroom Transfers Anticipated Outcome(s): Mod I-supervision OT Recommendation Patient destination: Home Follow Up Recommendations: Other (comment) (To be determined) Equipment Recommended: To be determined   OT Evaluation Precautions/Restrictions  Precautions Precautions: Fall;Other (comment) Precaution Comments: legally blind in L eye, 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 Restrictions Weight Bearing Restrictions: No General Chart Reviewed: Yes Additional Pertinent History: On chomo for esophagus cancer andthroat cancer,  Previous CVA in 2019 and 2023, HTN, DM, depression, Aniety, CKD stage III, legally blind in L eye, chronic B LE edema Family/Caregiver Present: No Vital Signs   Pain Pain Assessment Pain Scale: 0-10 Pain Score:  ("just the usual pain...knees, back, shoulders") Pain Type: Acute pain Pain Location: Leg Pain Orientation: Right;Left Pain Descriptors / Indicators: Throbbing Pain Frequency: Intermittent Pain Onset: On-going Pain Intervention(s): Medication (See eMAR);Rest;Relaxation;Distraction;Emotional support Home Living/Prior Functioning Home Living Family/patient expects to be discharged to:: Private residence Living Arrangements: Other relatives Available Help at Discharge: Family, Available PRN/intermittently (Pt reports sister works out of the house full time; other family members are available PRN) Type of Home: House (townhouse) Home Access: Other (comment) Entrance Stairs-Number of Steps: 1 small step-up into house Home Layout: Two level, 1/2 bath on main level, Bed/bath upstairs Alternate Level Stairs-Number of Steps: flight Alternate Level Stairs-Rails: Right Bathroom Shower/Tub: Health visitor: Standard Bathroom Accessibility: Yes Additional Comments: reports RW doesn't work well in house due to limited space so he furtniture walks and then uses RW when outside the home; goes out of home at least 1x/week; only does stair navigation when sister at home for safety but she does not physically help him  Lives With: Family (sister) IADL History Homemaking Responsibilities: Yes Meal Prep Responsibility: Secondary (simple meal prep) Laundry Responsibility: Secondary Cleaning Responsibility: Secondary Bill Paying/Finance Responsibility: No Shopping Responsibility: Secondary Child Care Responsibility: No Current License: No Mode of Transportation: Family Education: some college Occupation: Other (comment) (trying to get on disability) Leisure and Hobbies:  enjoys being outside IADL Comments: sister completes most IADLs; Pt reports he helps out where he can Prior Function Level of Independence: Independent with gait, Independent with transfers, Other (comment), Independent with basic ADLs, Needs assistance with homemaking  Able to Take Stairs?: Yes Driving: No Vision Baseline Vision/History: 1 Wears glasses (legally blind in L eye) Ability to See in Adequate Light: 1 Impaired Patient Visual Report: No change from baseline Perception  Perception: Impaired Perception-Other Comments: Decreased depth perception Praxis Praxis: WFL Cognition Cognition Overall Cognitive Status: Within Functional Limits for tasks assessed (Pt reports  he is at his baseline for functional cognitiion with no new changes since most recepnt CVA) Arousal/Alertness: Awake/alert Orientation Level: Person;Place;Situation Person: Oriented Place: Oriented Situation: Oriented Memory: Appears intact Attention: Focused;Sustained Focused Attention: Appears intact Sustained Attention: Appears intact Awareness: Appears intact Problem Solving: Appears intact Safety/Judgment: Appears intact Brief Interview for Mental Status (BIMS) Repetition of Three Words (First Attempt): 3 Temporal Orientation: Year: Correct Temporal Orientation: Month: Accurate within 5 days Temporal Orientation: Day: Correct Recall: "Sock": Yes, after cueing ("something to wear") Recall: "Blue": Yes, no cue required Recall: "Bed": Yes, no cue required BIMS Summary Score: 14 Sensation Sensation Light Touch: Appears Intact Hot/Cold: Not tested Proprioception: Impaired by gross assessment Stereognosis: Not tested Coordination Gross Motor Movements are Fluid and Coordinated: No Fine Motor Movements are Fluid and Coordinated: No Coordination and Movement Description: Decreased strength bilaterally 2/2 previous CVAs with L UE>R UE; decreased balance d/t B LE edema Finger Nose Finger Test: Decreased  depth perception with significant overshooting Motor  Motor Motor: Hemiplegia Motor - Skilled Clinical Observations: Mild decreased strength in L UE compared to R UE; decreased balance/coordination d/t BLE edema  Trunk/Postural Assessment  Cervical Assessment Cervical Assessment: Exceptions to [facility] (mild forward head) Thoracic Assessment Thoracic Assessment: Exceptions to [facility] (mild rounded shoulders) Lumbar Assessment Lumbar Assessment: Within Functional Limits Postural Control Postural Control: Deficits on evaluation (requireds RW for trunk positioning during ambulation)  Balance Balance Balance Assessed: Yes Static Sitting Balance Static Sitting - Balance Support: Feet supported Static Sitting - Level of Assistance: 5: Stand by assistance Dynamic Sitting Balance Dynamic Sitting - Balance Support: Feet supported Dynamic Sitting - Level of Assistance: 5: Stand by assistance Static Standing Balance Static Standing - Balance Support: During functional activity;Bilateral upper extremity supported Static Standing - Level of Assistance: 5: Stand by assistance (CGA) Dynamic Standing Balance Dynamic Standing - Balance Support: During functional activity;Bilateral upper extremity supported Dynamic Standing - Level of Assistance: 4: Min assist;5: Stand by assistance (CGA) Extremity/Trunk Assessment RUE Assessment RUE Assessment: Within Functional Limits Passive Range of Motion (PROM) Comments: WFL Active Range of Motion (AROM) Comments: WFL General Strength Comments: 4+/5 LUE Assessment LUE Assessment: Exceptions to [facility] Passive Range of Motion (PROM) Comments: WFL Active Range of Motion (AROM) Comments: WFL General Strength Comments: 4-/5  Care Tool Care Tool Self Care Eating   Eating Assist Level: Set up assist    Oral Care    Oral Care Assist Level: Contact Guard/Toucning assist    Bathing   Body parts bathed by patient: Right arm;Left arm;Chest;Abdomen;Front perineal  area;Buttocks;Right upper leg;Left upper leg;Right lower leg;Left lower leg;Face     Assist Level: Contact Guard/Touching assist    Upper Body Dressing(including orthotics)   What is the patient wearing?: Pull over shirt   Assist Level: Contact Guard/Touching assist    Lower Body Dressing (excluding footwear)   What is the patient wearing?: Pants;Underwear/pull up Assist for lower body dressing: Contact Guard/Touching assist    Putting on/Taking off footwear   What is the patient wearing?: Socks;Shoes Assist for footwear: Minimal Assistance - Patient > 75%       Care Tool Toileting Toileting activity   Assist for toileting: Contact Guard/Touching assist     Care Tool Bed Mobility Roll left and right activity   Roll left and right assist level: Independent with assistive device    Sit to lying activity   Sit to lying assist level: Supervision/Verbal cueing    Lying to sitting on side of bed activity   Lying to  sitting on side of bed assist level: the ability to move from lying on the back to sitting on the side of the bed with no back support.: Supervision/Verbal cueing     Care Tool Transfers Sit to stand transfer   Sit to stand assist level: Contact Guard/Touching assist Sit to stand assistive device: Walker  Chair/bed transfer   Chair/bed transfer assist level: Contact Guard/Touching assist Chair/bed transfer assistive device: Museum/gallery exhibitions officer transfer   Assist Level: Contact Guard/Touching assist     Care Tool Cognition  Expression of Ideas and Wants Expression of Ideas and Wants: 3. Some difficulty - exhibits some difficulty with expressing needs and ideas (e.g, some words or finishing thoughts) or speech is not clear  Understanding Verbal and Non-Verbal Content Understanding Verbal and Non-Verbal Content: 4. Understands (complex and basic) - clear comprehension without cues or repetitions   Memory/Recall Ability Memory/Recall Ability : Current season;That he or  she is in a hospital/hospital unit   Refer to Care Plan for Long Term Goals  SHORT TERM GOAL WEEK 1 OT Short Term Goal 1 (Week 1): STG=LTG d/t ELOS  Recommendations for other services: None    Skilled Therapeutic Intervention Skilled Therapeutic Interventions/Progress Updates: 1:1 OT evaluation and intervention initiated with skilled education provided on OT role, goals, and POC. Pt received sitting up in recliner presenting to be in good spirits receptive to skilled OT session reporting 2/10 pain in BLE d/t chronic edema- OT offering intermittent rest breaks, repositioning, and therapeutic support to optimize participation in therapy session. Pt completed shower level BADLs this session at levels listed below and completed U/LB dressing tasks seated in wc. Pt stood at sink to complete grooming/hygiene tasks with CGA. Pt utilized RW for functional mobility throughout session with CGA provided for balance and min verbal cues for safety. Pt able to compensate for visual deficits 2/2 to previus CVAs without verbal cues required. Pt would benefit from continued OT services in IPR setting to increase independence in BADLs and IADLs. Pt was left resting in recliner with call bell in reach, seat belt alarm on, and all needs met.    ADL ADL Eating: Set up Where Assessed-Eating: Chair Grooming: Contact guard Where Assessed-Grooming: Standing at sink Upper Body Bathing: Contact guard Where Assessed-Upper Body Bathing: Shower Lower Body Bathing: Contact guard Where Assessed-Lower Body Bathing: Shower Upper Body Dressing: Contact guard Where Assessed-Upper Body Dressing: Wheelchair Lower Body Dressing: Contact guard Where Assessed-Lower Body Dressing: Wheelchair Toileting: Contact guard Where Assessed-Toileting: Teacher, adult education: Furniture conservator/restorer Method: Proofreader: Engineer, technical sales: Not assessed Film/video editor: Health visitor Method: Designer, industrial/product: Grab bars;Transfer tub bench Mobility  Bed Mobility Bed Mobility: Supine to Sit;Sit to Supine Supine to Sit: Supervision/Verbal cueing Sit to Supine: Supervision/Verbal cueing Transfers Sit to Stand: Contact Guard/Touching assist Stand to Sit: Contact Guard/Touching assist   Discharge Criteria: Patient will be discharged from OT if patient refuses treatment 3 consecutive times without medical reason, if treatment goals not met, if there is a change in medical status, if patient makes no progress towards goals or if patient is discharged from hospital.  The above assessment, treatment plan, treatment alternatives and goals were discussed and mutually agreed upon: by patient  Clide Deutscher 11/28/2022, 11:58 AM

## 2022-11-28 NOTE — Progress Notes (Signed)
Inpatient Rehabilitation  Patient information reviewed and entered into eRehab system by Kelly Gentry, OTR/L, Rehab Quality Coordinator.   Information including medical coding, functional ability and quality indicators will be reviewed and updated through discharge.   

## 2022-11-28 NOTE — Plan of Care (Signed)
  Problem: RH Balance Goal: LTG Patient will maintain dynamic sitting balance (PT) Description: LTG:  Patient will maintain dynamic sitting balance with assistance during mobility activities (PT) Flowsheets (Taken 11/28/2022 1837) LTG: Pt will maintain dynamic sitting balance during mobility activities with:: Independent with assistive device  Goal: LTG Patient will maintain dynamic standing balance (PT) Description: LTG:  Patient will maintain dynamic standing balance with assistance during mobility activities (PT) Flowsheets (Taken 11/28/2022 1837) LTG: Pt will maintain dynamic standing balance during mobility activities with:: Supervision/Verbal cueing   Problem: Sit to Stand Goal: LTG:  Patient will perform sit to stand with assistance level (PT) Description: LTG:  Patient will perform sit to stand with assistance level (PT) Flowsheets (Taken 11/28/2022 1837) LTG: PT will perform sit to stand in preparation for functional mobility with assistance level: Independent with assistive device   Problem: RH Bed Mobility Goal: LTG Patient will perform bed mobility with assist (PT) Description: LTG: Patient will perform bed mobility with assistance, with/without cues (PT). Flowsheets (Taken 11/28/2022 1837) LTG: Pt will perform bed mobility with assistance level of: Independent with assistive device    Problem: RH Bed to Chair Transfers Goal: LTG Patient will perform bed/chair transfers w/assist (PT) Description: LTG: Patient will perform bed to chair transfers with assistance (PT). Flowsheets (Taken 11/28/2022 1837) LTG: Pt will perform Bed to Chair Transfers with assistance level: Independent with assistive device    Problem: RH Car Transfers Goal: LTG Patient will perform car transfers with assist (PT) Description: LTG: Patient will perform car transfers with assistance (PT). Flowsheets (Taken 11/28/2022 1837) LTG: Pt will perform car transfers with assist:: Supervision/Verbal cueing    Problem: RH Ambulation Goal: LTG Patient will ambulate in controlled environment (PT) Description: LTG: Patient will ambulate in a controlled environment, # of feet with assistance (PT). Flowsheets (Taken 11/28/2022 1837) LTG: Pt will ambulate in controlled environ  assist needed:: Supervision/Verbal cueing LTG: Ambulation distance in controlled environment: 140ft using LRAD Goal: LTG Patient will ambulate in home environment (PT) Description: LTG: Patient will ambulate in home environment, # of feet with assistance (PT). Flowsheets (Taken 11/28/2022 1837) LTG: Pt will ambulate in home environ  assist needed:: Independent with assistive device LTG: Ambulation distance in home environment: 100ft using LRAD   Problem: RH Stairs Goal: LTG Patient will ambulate up and down stairs w/assist (PT) Description: LTG: Patient will ambulate up and down # of stairs with assistance (PT) Flowsheets (Taken 11/28/2022 1837) LTG: Pt will ambulate up/down stairs assist needed:: Supervision/Verbal cueing LTG: Pt will  ambulate up and down number of stairs: 12 steps using only R HR

## 2022-11-29 DIAGNOSIS — I639 Cerebral infarction, unspecified: Secondary | ICD-10-CM | POA: Diagnosis not present

## 2022-11-29 LAB — GLUCOSE, CAPILLARY
Glucose-Capillary: 90 mg/dL (ref 70–99)
Glucose-Capillary: 152 mg/dL — ABNORMAL HIGH (ref 70–99)
Glucose-Capillary: 115 mg/dL — ABNORMAL HIGH (ref 70–99)
Glucose-Capillary: 114 mg/dL — ABNORMAL HIGH (ref 70–99)

## 2022-11-29 MED ORDER — TORSEMIDE 20 MG PO TABS
20.0000 mg | ORAL_TABLET | Freq: Every day | ORAL | Status: DC
  Administered 2022-11-29 – 2022-12-02 (×4): 20 mg via ORAL
  Filled 2022-11-29 (×4): qty 1

## 2022-11-29 NOTE — Progress Notes (Signed)
Inpatient Rehabilitation Center Individual Statement of Services  Patient Name:  Jeremiah Vance  Date:  11/29/2022  Welcome to the Inpatient Rehabilitation Center.  Our goal is to provide you with an individualized program based on your diagnosis and situation, designed to meet your specific needs.  With this comprehensive rehabilitation program, you will be expected to participate in at least 3 hours of rehabilitation therapies Monday-Friday, with modified therapy programming on the weekends.  Your rehabilitation program will include the following services:  Physical Therapy (PT), Occupational Therapy (OT), Speech Therapy (ST), 24 hour per day rehabilitation nursing, Therapeutic Recreaction (TR), Neuropsychology, Care Coordinator, Rehabilitation Medicine, Nutrition Services, Pharmacy Services, and Other  Weekly team conferences will be held on Wednesdays to discuss your progress.  Your Inpatient Rehabilitation Care Coordinator will talk with you frequently to get your input and to update you on team discussions.  Team conferences with you and your family in attendance may also be held.  Expected length of stay: 10 to 14 days   Overall anticipated outcome:  Supervision  Depending on your progress and recovery, your program may change. Your Inpatient Rehabilitation Care Coordinator will coordinate services and will keep you informed of any changes. Your Inpatient Rehabilitation Care Coordinator's name and contact numbers are listed  below.  The following services may also be recommended but are not provided by the Inpatient Rehabilitation Center:   Home Health Rehabiltiation Services Outpatient Rehabilitation Services    Arrangements will be made to provide these services after discharge if needed.  Arrangements include referral to agencies that provide these services.  Your insurance has been verified to be:  [place] [employer]ICA[employer] Your primary doctor is:  Debera Lat, PA-C  Pertinent information  will be shared with your doctor and your insurance company.  Inpatient Rehabilitation Care Coordinator:  Lavera Guise, [place] [phone] or [phone]  Information discussed with and copy given to patient by: Andria Rhein, 11/29/2022, 10:41 AM

## 2022-11-29 NOTE — Progress Notes (Signed)
Occupational Therapy Session Note  Patient Details  Name: Jeremiah Vance MRN: [id number] Date of Birth: 1970/04/29  Today's Date: 11/29/2022 OT Individual Time: 7829-5621 OT Individual Time Calculation (min): 52 min    Short Term Goals: Week 1:  OT Short Term Goal 1 (Week 1): STG=LTG d/t ELOS  Skilled Therapeutic Interventions/Progress Updates:  Pt greeted seated in recliner, pt agreeable to OT intervention. Pt completed ambulatory transfer from recliner>bathroom with no AD but pt noted to furniture walk around room with CGA. Pt completed 3/3 toileting tasks supervision.   Pt transported to gym in w/c with total A for time mgmt. Pt completed functional mobility task where pt instructed to ambulate around gym to collect wash cloths with Rw to simulate iADL task of cleaning up around his home. Pt able to complete task with RW and CGA with pt reaching below knee level to retrieve items. Pt able to stand on airex cushion to fold wash cloths to simulate iADL task and challenge dynamic balance. Pt completed task with no UE support and MINA.   Pt able to complete standing therapeutic activity where pt instructed to step up on airex pad, retrieve clothespin with RUE and place clothespin on basketball net to the L side of pt with an emphasis on dynamic standing balance, BUE motor planning and visual compensatory strategies. Education provided on using sensation to compensate for decreased visual acuity with pt feeling for opening on net before placing clothespin. Pt completed task with [facility] for balance.   Pt completed simulated IADL task in apt where pt instructed to ambulate around kitchen to simulate making a microwave meal. Pt completed task with RW and CGA. Pt able to reach [facility] and below knee level to remove items from cabinets, pt did require MIN cues to locate items d/t visual deficits. Education provided on energy conservation strategies/ fall prevention tips for kitchen mobility.   Ended session  with pt seated in recliner with alarm belt activated and all needs within reach.  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     Therapy/Group: Individual Therapy  Barron Schmid 11/29/2022, 3:48 PM

## 2022-11-29 NOTE — IPOC Note (Signed)
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) Active Problems:   PAF (paroxysmal atrial fibrillation) (HCC)   Diabetes mellitus (HCC)   Class 1 obesity with serious comorbidity in adult   Benign essential HTN     Functional Problem List: Nursing Bowel, Safety, Sensory, Medication Management, Pain, Endurance  PT Balance, Safety, Edema, Skin Integrity, Endurance, Motor, Nutrition, Pain  OT Balance, Edema, Endurance, Motor, Pain, Skin Integrity, Sensory  SLP    TR         Basic ADL's: OT Toileting, Dressing, Bathing     Advanced  ADL's: OT Laundry, Simple Meal Preparation     Transfers: PT Bed Mobility, Bed to Chair, Car, [employer], Floor  OT Tub/Shower, Technical brewer: PT Ambulation, Stairs     Additional Impairments: OT Fuctional Use of Upper Extremity  SLP        TR      Anticipated Outcomes Item Anticipated Outcome  Self Feeding Mod I  Swallowing      Basic self-care  Mod I-supervision  Toileting  Mod I-supervision   Bathroom Transfers Mod I-supervision  Bowel/Bladder  continent with constipation  Transfers  mod-I  Locomotion  mod-I household level  Communication     Cognition     Pain  less than 4  Safety/Judgment  no falls   Therapy Plan: PT Intensity: Minimum of 1-2 x/day ,45 to 90 minutes PT Frequency: 5 out of 7 days PT Duration Estimated Length of Stay: 7-10 days OT Intensity: Minimum of 1-2 x/day, 45 to 90 minutes OT Frequency: 5 out of 7 days OT Duration/Estimated Length of Stay: 5-7 days     Team Interventions: Nursing Interventions Patient/Family Education, Pain Management, Bowel Management, Medication Management, Discharge Planning, Skin Care/Wound Management, Psychosocial Support,  Disease Management/Prevention  PT interventions Ambulation/gait training, Cognitive remediation/compensation, Discharge planning, DME/adaptive equipment instruction, Functional mobility training, Pain management, Psychosocial support, Splinting/orthotics, Therapeutic Activities, UE/LE Strength taining/ROM, Visual/perceptual remediation/compensation, Warden/ranger, Community reintegration, Functional electrical stimulation, Disease management/prevention, Neuromuscular re-education, Patient/family education, Skin care/wound management, Stair training, Therapeutic Exercise, UE/LE Coordination activities  OT Interventions Balance/vestibular training, Self Care/advanced ADL retraining, UE/LE Coordination activities, Cognitive remediation/compensation, Functional mobility training, Skin care/wound managment, Community reintegration, Neuromuscular re-education, Discharge planning, Therapeutic Activities, Pain management, Disease mangement/prevention, Patient/family education, Therapeutic Exercise, DME/adaptive equipment instruction, Psychosocial support, UE/LE Strength taining/ROM, Visual/perceptual remediation/compensation  SLP Interventions    TR Interventions    SW/CM Interventions     Barriers to Discharge MD  Medical stability  Nursing Decreased caregiver support, Home environment access/layout, Wound Care, Weight, Medication compliance, Pending chemo/radiation 2 level wiht 1/2 bath on main level. Bedroom up flight of steps with rail on right. Level entry  PT Decreased caregiver support, Home environment access/layout, Pending chemo/radiation    OT      SLP      SW       Team Discharge Planning: Destination: PT-Home ,OT- Home , SLP-  Projected Follow-up: PT-Home health PT, 24 hour supervision/assistance, OT-  Other (comment) (To be determined), SLP-  Projected Equipment Needs: PT-To be determined, OT- To be determined, SLP-  Equipment Details: PT- , OT-  Patient/family involved  in discharge planning: PT- Patient,  OT-Patient, SLP-   MD ELOS: 7d Medical Rehab Prognosis:  Good Assessment: The patient has been admitted for CIR  therapies with the diagnosis of frontal lobe ischemic CVA. The team will be addressing functional mobility, strength, stamina, balance, safety, adaptive techniques and equipment, self-care, bowel and bladder mgt, patient and caregiver education, BP and edema management . Goals have been set at modI/Sup. Anticipated discharge destination is home.        See Team Conference Notes for weekly updates to the plan of care

## 2022-11-29 NOTE — Progress Notes (Signed)
Physical Therapy Session Note  Patient Details  Name: Jeremiah Vance MRN: [id number] Date of Birth: [birth year]  Today's Date: 11/29/2022 PT Individual Time: 0805-0858 PT Individual Time Calculation (min): 53 min  And  PT Amount of Missed Time (min): 7 Minutes PT Missed Treatment Reason: Other (Comment) (Late breakfast arrival)  Short Term Goals: Week 1:  PT Short Term Goal 1 (Week 1): = to LTGs based on ELOS   Skilled Therapeutic Interventions/Progress Updates:  Patient supine in bed on entrance to room. Patient alert and agreeable to PT session.   Patient with no pain complaint at start of session.  Breakfast arrived but not yet touched by pt.   Therapeutic Activity: Bed Mobility: Pt performed supine <> sit with supervision. No vc required for technique. Transfers: Pt performed sit<>stand and stand pivot transfers at start of session with light MinA but then improves during session to light CGA/ close supervision to RW. Provided vc for forward lean with BUE positioning on seat or BLE.  Gait Training:  Pt taken to hospital stairwell for training on stairs to more closely simulate home environment. Pt is able to ascend eleven 6" steps using BUE on R side handrail as per home environment. Requires CGA for first bout and SBA for second bout. Lost his slide from R foot on one step and requires tightening to complete. Seated rest at top of stairwell. Descends using sidestep step-to pattern with BUE to handrail on L side with CGA then SBA.   Gait training initiated in therapy gym with focus on ambulation without AD. Completes 67' x2 with CGA and vc for heel strike. Demonstrates slower than usual pace, intermittent increase in lateral sway with slow return to midline, WBOS, decreased hip/ knee flexion in LLE compared to RLE.   Patient seated upright in recliner at end of session with brakes locked, belt alarm set, and all needs within reach. Positioned to eat breakfast.  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   Therapy/Group: Individual Therapy  Loel Dubonnet PT, DPT, CSRS 11/29/2022, 8:59 AM

## 2022-11-29 NOTE — Progress Notes (Signed)
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) Active Problems:   PAF (paroxysmal atrial fibrillation) (HCC)   Diabetes mellitus (HCC)   Class 1 obesity with serious comorbidity in adult   Benign essential HTN  Past Medical History:  Past Medical History:  Diagnosis Date   Acute ischemic stroke (HCC) 09/06/2017   Acute renal failure superimposed on stage 3a chronic kidney disease (HCC) 07/08/2019   Acute right-sided weakness    Allergies    Anasarca    Anemia 07/10/2017   Arthritis    Cancer (HCC)    stomach and esophageal cancer stage 4   CHF (congestive heart failure) (HCC)    "coinsided w/kidney problems I was having 06/2017"   Chicken pox    CKD (chronic kidney disease) stage 3, GFR 30-59 ml/min (HCC) 06/2017   Depression    Elevated troponin 07/08/2019   High cholesterol    History of cardiomyopathy    LVEF 40 to 45% in April 2019 - subsequently normalized   Hyperbilirubinemia 07/10/2017   Hypertension    Ischemic stroke (HCC)    Small left internal capsule infarct due to lacunar disease   Morbid obesity (HCC)    Normocytic anemia 12/16/2017   Recurrent incisional hernia with incarceration s/p repair 10/22/2017 10/21/2017   Stroke (HCC) 08/2017   "right sided weakness since; getting stronger though" (10/22/2017)   Type 2 diabetes mellitus (HCC)    Past Surgical History:  Past Surgical History:  Procedure Laterality Date   ABDOMINAL HERNIA REPAIR  2008; 10/22/2017   "scope; OPEN REPAIR INCARCERATED VENTRAL HERNIA   ESOPHAGOGASTRODUODENOSCOPY (EGD) WITH PROPOFOL N/A 09/10/2022   Procedure: ESOPHAGOGASTRODUODENOSCOPY (EGD) WITH PROPOFOL;  Surgeon: Toney Reil, MD;  Location: [facility] ENDOSCOPY;  Service: Gastroenterology;  Laterality: N/A;    ESOPHAGOGASTRODUODENOSCOPY (EGD) WITH PROPOFOL N/A 09/28/2022   Procedure: ESOPHAGOGASTRODUODENOSCOPY (EGD) WITH PROPOFOL;  Surgeon: Jaynie Collins, DO;  Location: [facility] ENDOSCOPY;  Service: Gastroenterology;  Laterality: N/A;   HEMOSTASIS CONTROL  09/28/2022   Procedure: HEMOSTASIS CONTROL;  Surgeon: Jaynie Collins, DO;  Location: [facility] ENDOSCOPY;  Service: Gastroenterology;;   HERNIA REPAIR     KNEE ARTHROSCOPY Right 1989   PORTA CATH INSERTION N/A 09/25/2022   Procedure: PORTA CATH INSERTION;  Surgeon: Annice Needy, MD;  Location: [facility] INVASIVE CV LAB;  Service: Cardiovascular;  Laterality: N/A;   VENTRAL HERNIA REPAIR N/A 10/22/2017   Procedure: OPEN REPAIR INCARCERATED VENTRAL HERNIA;  Surgeon: Glenna Fellows, MD;  Location: [facility] OR;  Service: General;  Laterality: N/A;   Social History:  reports that he has never smoked. He has never used smokeless tobacco. He reports current alcohol use. He reports that he does not use drugs.  Family / Support Systems Marital Status: Single Spouse/Significant Other: n/a Children: n/a Other Supports: Sister, [name]. Brother and aunt Anticipated Caregiver: Primary, stephanie [phone] Ability/Limitations of Caregiver: Sister works. Brother and aunt to assist while sister works Engineer, structural Availability: 24/7 Family Dynamics: Supportive family. Primarily sister  Social History Preferred language: English Religion: Episcopalian Cultural Background: n/a Education: HS Health Literacy - How often do you need to have someone help you when you read instructions, pamphlets, or other written material from your doctor or pharmacy?: Always Writes: Yes Employment Status: Disabled Date Retired/Disabled/Unemployed: n/a Marine scientist Issues: n/a Guardian/ConservatorAsim Abad [phone]   Abuse/Neglect Abuse/Neglect  Assessment Can Be Completed: Yes Physical Abuse: Denies Verbal Abuse: Denies Sexual Abuse:  Denies Exploitation of patient/patient's resources: Denies Self-Neglect: Denies  Patient response to: Social Isolation - How often do you feel lonely or isolated from those around you?: Sometimes  Emotional Status Pt's affect, behavior and adjustment status: Pleasan Recent Psychosocial Issues: coping Psychiatric History: hx of depression Substance Abuse History: n/a  Patient / Family Perceptions, Expectations & Goals Pt/Family understanding of illness & functional limitations: Spoke with patient. Left VM with sister, [name] Premorbid pt/family roles/activities: Limited, assist from family Anticipated changes in roles/activities/participation: Family plans to continue to assist . Brother and aunt to assist while sister working. Pt/family expectations/goals: supervision  [employer]: None Premorbid Home Care/DME Agencies: Other (Comment) (Shower seat and RW) Transportation available at discharge: sister able to transport Is the patient able to respond to transportation needs?: Yes In the past 12 months, has lack of transportation kept you from medical appointments or from getting medications?: No In the past 12 months, has lack of transportation kept you from meetings, work, or from getting things needed for daily living?: No Resource referrals recommended: Neuropsychology  Discharge Planning Living Arrangements: Other relatives Support Systems: Other relatives Type of Residence: Private residence (2 level home, steps to enter) Insurance Resources: [employer] (specify county) Architect: Family Support Financial Screen Referred: No Living Expenses: Lives with family Money Management: Patient, Family Does the patient have any problems obtaining your medications?: No Home Management: assist from family Patient/Family Preliminary Plans: Family plans to continue to assist 24/7. Care Coordinator Barriers to Discharge: Lack of/limited family  support, Insurance for SNF coverage, Decreased caregiver support Care Coordinator Anticipated Follow Up Needs: HH/OP Expected length of stay: 10-14 Days  Clinical Impression SW met with patient, introduced self and explained role. Sw made attempt to call patient sister, [name]. SW left message to arrange family education. Sister works during the week, education tentatively arranged on Saturday 1-4 PM.   Patient discharging back home with sister and family to assist 24/7. Sister does work during the day, brother and aunt will assist patient while sister works. Patients bedroom on the 2nd level of the home with steps to enter. Patient currently has a RW. No additional questions or concerns.   Andria Rhein 11/29/2022, 12:53 PM

## 2022-11-29 NOTE — Progress Notes (Signed)
Patient ID: Jeremiah Vance, male   DOB: 02/22/1971, 52 y.o.   MRN: [id number]  VM left for pt's sister, [name] to arrange family education. Sw waiting on FU.

## 2022-11-29 NOTE — Discharge Summary (Signed)
Physician Discharge Summary  Patient ID: Jeremiah Vance MRN: [id number] DOB/AGE: [birth year] 52 y.o.  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 Hyperlipidemia Chronic anemia Obesity Legally blind left eye  Discharged Condition: Stable  Significant Diagnostic Studies:  Labs:  Basic Metabolic Panel: Recent Labs  Lab 12/04/22 0709 12/06/22 0659  NA 138 137  K 4.2 4.4  CL 99 98  CO2 25 29  GLUCOSE 99 102*  BUN 26* 28*  CREATININE 1.63* 1.79*  CALCIUM 8.5* 8.5*    CBC:    Latest Ref Rng & Units 12/02/2022    6:47 AM 11/28/2022    4:31 AM 11/27/2022    4:47 AM  CBC  WBC 4.0 - 10.5 K/uL 5.8  5.6  6.3   Hemoglobin 13.0 - 17.0 g/dL 9.7  9.5  9.5   Hematocrit 39.0 - 52.0 % 32.2  30.4  29.9   Platelets 150 - 400 K/uL 439  343  314       CBG: Recent Labs  Lab 12/06/22 0627 12/06/22 1214 12/06/22 1658 12/06/22 2047 12/07/22 0601  GLUCAP 97 86 107* 126* 99   Family history.  Mother with diabetes as well as coronary artery disease and hypertension.  Father with hypertension and diabetes.  Denies any colon cancer or rectal cancer  Brief HPI:   Jeremiah Vance is a 52 y.o. 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 Cathie Hoops, hypertension, hyperlipidemia, obesity with BMI 35.11, diabetes mellitus, systolic heart failure, CVA August 2023 with mild left-sided weakness, atrial fibrillation off anticoagulation secondary to gastric malignancy with hemoglobin drop, CKD stage III, legally blind in left eye.  Per chart review patient lives  with a sister.  Two-level home bed and bath upstairs.  Modified independent with a rolling walker.  Presented to [facility] 11/21/2018 for left-sided numbness and increasing weakness.  Initial cranial CT scan showed compared to prior exam no new cytotoxic edema.  No definite evidence of parenchymal hematoma.  MRI showed subacute infarct left frontal operculum with petechial hemorrhage and mild surrounding edema.  No significant mass effect or midline shift.  MRA no large vessel occlusion or stenosis.  Admission chemistries unremarkable except BUN 41 creatinine 1.51 hemoglobin 10.4 hemoglobin A1c 7.0.  Echocardiogram with ejection fraction of 55 to 60% no wall motion abnormalities.  Patient did not receive TNK.  Neurology follow-up cleared to begin Eliquis for CVA prophylaxis.  Hospital course follow-up oncology services for GE junction carcinoma chemotherapy currently on hold.  Tolerating a regular consistency diet.  Therapy evaluations completed due to patient decreased functional mobility left-sided weakness and numbness was admitted for a comprehensive rehab program.   Hospital Course: Jeremiah Vance was admitted to rehab 11/27/2022 for inpatient therapies to consist of PT, ST and OT at least three hours five days a week. Past admission physiatrist, therapy team and rehab RN have worked together to provide customized collaborative inpatient rehab.  Pertaining to patient's subacute infarct left frontal operculum with petechial hemorrhage mild surrounding edema as well as history of CVA with left-sided weakness 8/23.  No clear-cut neurofindings from new CVA other than word  finding deficits.  He had been cleared to begin Eliquis for CVA prophylaxis as well as history of atrial fibrillation.  Mood stabilization with the use of Zoloft and Lamictal and BuSpar.  Patient participating fully with therapies.  He did have a history of GE junction carcinoma stage IV chemotherapy currently on hold per oncology services until  follow-up.  Chronic combined systolic diastolic congestive heart failure Demadex as needed and monitoring exhibiting no signs of fluid overload.  Blood sugars controlled hemoglobin A1c 7.0 he had been on Farxiga prior to admission.  Obesity BMI 35.11 with dietary follow-up.  CKD stage III with monitoring of chemistries and renal panel.  Lipitor for hyperlipidemia.   Blood pressures were monitored on TID basis and controlled  Diabetes has been monitored with ac/hs CBG checks and SSI was use prn for tighter BS control.    Rehab course: During patient's stay in rehab weekly team conferences were held to monitor patient's progress, set goals and discuss barriers to discharge. At admission, patient required minimal assist 75 feet rolling walker minimal assist stand pivot transfer  He  has had improvement in activity tolerance, balance, postural control as well as ability to compensate for deficits. He has had improvement in functional use RUE/LUE  and RLE/LLE as well as improvement in awareness.  Sit to stand with recliner contact-guard.  Ambulates from recliner to wheelchair with left hand-held assist rolling walker as needed.  Simulated ambulatory car transfers with rolling walker contact-guard.  Ambulates up the ramp with rolling walker contact-guard.  He can increase his ambulation up to 80 feet.  He catheter his belongings for activities day living homemaking.  Full family teaching completed plan discharge to home.  Patient has met 9 of 9 long term goals due to improved activity tolerance, improved balance, improved postural control, increased strength, ability to compensate for deficits, functional use of  left upper extremity and left lower extremity, improved awareness, and improved coordination.  Patient to discharge at an ambulatory level Modified Independent using RW vs rollator, but requires supervision for stair navigation. Patient's care partner is independent to provide the necessary physical  assistance at discharge.     Disposition: Discharge to home  Diet: Regular consistency diet  Special Instructions: No driving smoking or alcohol  Recommendations: per Dr. 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  Please increase gradually as below    Morning  Night  Week 1 and 2   none   25 mg   Week 3 and 4   25 mg    25 mg  Week 5   25 mg   50 mg  Week 6   50 mg   75 mg  Week 7   75 mg 100 mg  Week 8  100 mg 125 mg   Patient discharged home at start of Week 3. 4 weeks of Lamictal prescribed and will follow-up with neurology prior to week 7.  30-35 minutes were spent completing discharge summary and discharge planning  Discharge Instructions     Ambulatory referral to Neurology   Complete by: As directed    An appointment is requested in approximately: 4 weeks left frontal operculum   Ambulatory referral to Physical Medicine Rehab   Complete by: As directed    Moderate complexity follow-up 1 to 2 weeks left frontal operculum CVA        Follow-up Information     Kirsteins, Victorino Sparrow, MD Follow up.   Specialty:  Physical Medicine and Rehabilitation Why: office will call you to arrange your appt (sent) Contact information: [address] [place] [address] [phone]         Rickard Patience, MD Follow up.   Specialty: Oncology Why: call for appointment Contact information: [address] [place] [address] [phone]         Debera Lat, PA-C Follow up.   Specialty: Physician Assistant Why: Call on Monday for hospital follow-up appointment. Contact information: [address] [place] [address] [phone]         [facility] Follow up.   Why: Call on Monday for hospital follow-up appointment. Contact information: [address]     [address] [place] [address] [phone]                Signed: Milinda Antis 12/09/2022, 10:37 AM

## 2022-11-29 NOTE — Progress Notes (Signed)
PROGRESS NOTE   Subjective/Complaints:  Had a good night as well as good day in therapy yesterday  ROS- neg for CP, SOB, N/V/D, denies swallowing issues   Objective:   No results found. Recent Labs    11/27/22 0447 11/28/22 0431  WBC 6.3 5.6  HGB 9.5* 9.5*  HCT 29.9* 30.4*  PLT 314 343   Recent Labs    11/27/22 0447 11/28/22 0431  NA 138 137  K 4.2 4.1  CL 102 101  CO2 25 25  GLUCOSE 106* 124*  BUN 31* 26*  CREATININE 1.37* 1.43*  CALCIUM 8.4* 8.5*    Intake/Output Summary (Last 24 hours) at 11/29/2022 0757 Last data filed at 11/28/2022 1900 Gross per 24 hour  Intake 920 ml  Output --  Net 920 ml        Physical Exam: Vital Signs Blood pressure (!) 152/84, pulse 76, temperature 98 F (36.7 C), resp. rate 18, height 5\' 11"  (1.803 m), weight 109.8 kg, SpO2 98%.   General: No acute distress Mood and affect are appropriate Heart: Regular rate and rhythm no rubs murmurs or extra sounds Lungs: Clear to auscultation, breathing unlabored, no rales or wheezes Abdomen: Positive bowel sounds, soft nontender to palpation, nondistended Extremities: No clubbing, cyanosis, 2+ pretibedema Skin: No evidence of breakdown, no evidence of rash, pretibial stasis dermatitis with hyperpigmentation  Neurologic: Cranial nerves II through XII intact, motor strength is 3-/5 in left and 4/5 right  deltoid, bicep, tricep, grip, hip flexor, knee extensors, ankle dorsiflexor and plantar flexor Sensory exam normal sensation to light touch  in bilateral upper and lower extremities, although some word finding issues limit exam  Pt unable to name body parts accurately  Cerebellar exam normal finger to nose to finger as well as heel to shin in bilateral upper and lower extremities Musculoskeletal: Full range of motion in all 4 extremities. No joint swelling   Assessment/Plan: 1. Functional deficits which require 3+ hours per day of  interdisciplinary therapy in a comprehensive inpatient rehab setting. Physiatrist is providing close team supervision and 24 hour management of active medical problems listed below. Physiatrist and rehab team continue to assess barriers to discharge/monitor patient progress toward functional and medical goals  Care Tool:  Bathing    Body parts bathed by patient: Right arm, Left arm, Chest, Abdomen, Front perineal area, Buttocks, Right upper leg, Left upper leg, Right lower leg, Left lower leg, Face         Bathing assist Assist Level: Contact Guard/Touching assist     Upper Body Dressing/Undressing Upper body dressing   What is the patient wearing?: Pull over shirt    Upper body assist Assist Level: Contact Guard/Touching assist    Lower Body Dressing/Undressing Lower body dressing      What is the patient wearing?: Pants, Underwear/pull up     Lower body assist Assist for lower body dressing: Contact Guard/Touching assist     Toileting Toileting    Toileting assist Assist for toileting: Contact Guard/Touching assist     Transfers Chair/bed transfer  Transfers assist     Chair/bed transfer assist level: Contact Guard/Touching assist Chair/bed transfer assistive device: Environmental health practitioner  Ambulation   Ambulation assist      Assist level: Contact Guard/Touching assist Assistive device: Walker-rolling Max distance: 39ft   Walk 10 feet activity   Assist     Assist level: Contact Guard/Touching assist Assistive device: Walker-rolling   Walk 50 feet activity   Assist    Assist level: Contact Guard/Touching assist Assistive device: Walker-rolling    Walk 150 feet activity   Assist Walk 150 feet activity did not occur: Safety/medical concerns         Walk 10 feet on uneven surface  activity   Assist Walk 10 feet on uneven surfaces activity did not occur: Safety/medical concerns (fatigue)         Wheelchair     Assist Is the  patient using a wheelchair?: Yes Type of Wheelchair: Manual (for transport)    Wheelchair assist level: Dependent - Patient 0%      Wheelchair 50 feet with 2 turns activity    Assist        Assist Level: Dependent - Patient 0%   Wheelchair 150 feet activity     Assist      Assist Level: Dependent - Patient 0%   Blood pressure (!) 152/84, pulse 76, temperature 98 F (36.7 C), resp. rate 18, height 5\' 11"  (1.803 m), weight 109.8 kg, SpO2 98%.  Medical Problem List and Plan: 1. 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 Pre existing)              -patient may shower             -ELOS/Goals: Supervision with PT and OT, 10 to 14 days             -Admit to CIR 2.  Antithrombotics: -DVT/anticoagulation:  Pharmaceutical: Eliquis             -antiplatelet therapy: N/A 3. Pain Management: Neurontin 100 mg twice daily, oxycodone as needed             -Increase Neurontin to 200mg  BID 4. Mood/Behavior/Sleep: Zoloft 50 mg daily, Lamictal 25 mg nightly, BuSpar 7.5 mg twice daily             -antipsychotic agents: N/A 5. Neuropsych/cognition: This patient is capable of making decisions on his own behalf. 6. Skin/Wound Care: Routine skin checks 7. Fluids/Electrolytes/Nutrition: Routine in and outs with follow-up chemistries 8.  Paroxysmal atrial fibrillation.  Continue Eliquis 2.5 mg twice daily.  Cardiac rate controlled 9.  GE junction carcinoma stage IV.  Chemotherapy per Dr.Yu.  Chemotherapy currently on hold follow-up outpatient 10.  Chronic combined systolic and diastolic congestive heart failure.  Continue Demadex 40 mg daily as needed.  Monitor for any signs of fluid overload.  Daily weights 11.  Diabetes mellitus.  Latest hemoglobin A1c 7.0.   Currently on SSI.  Patient on Farxiga 10 mg daily prior to admission.  Resume as needed CBG (last 3)  Recent Labs    11/28/22 1654 11/28/22 2208 11/29/22 0602  GLUCAP 124* 110* 90    12.  Obesity.  BMI 35.11.  Dietary follow-up 13.  CKD stage IIIb.  Follow-up chemistries 14.  Hyperlipidemia.  Lipitor 15.  Hypomagnesemia.  Recheck levels 16.  Chronic anemia.  Recheck labs tomorrow 17. Obesity. Dietary counseling Body mass index  is 33.76 kg/m. 18.  Essential hypertension.   Vitals:   11/28/22 1959 11/29/22 0525  BP: (!) 152/90 (!) 152/84  Pulse: 83 76  Resp: 18 18  Temp: 98.2 F (36.8 C) 98 F (36.7 C)  SpO2: 100% 98%  Elevated systolic torsemide ordered as prn but has not received since admit, does have LE edema will start torsemide 20mg  per day     LOS: 2 days A FACE TO FACE EVALUATION WAS PERFORMED  Erick Colace 11/29/2022, 7:57 AM

## 2022-11-29 NOTE — Progress Notes (Incomplete)
Occupational Therapy Session Note  Patient Details  Name: SHONDALE SLAVINSKI MRN: [id number] Date of Birth: [birth year]  {CHL IP REHAB OT TIME CALCULATIONS:304400400}   Short Term Goals: Week 1:  OT Short Term Goal 1 (Week 1): STG=LTG d/t ELOS  Skilled Therapeutic Interventions/Progress Updates:  Pt received *** for skilled OT session with focus on ***. Pt agreeable to interventions, demonstrating overall *** mood. Pt reported ***/10 pain, stating "***" in reference to ***. OT offering intermediate rest breaks and positioning suggestions throughout session to address pain/fatigue and maximize participation/safety in session.    Pt remained *** with all immediate needs met at end of session. Pt continues to be appropriate for skilled OT intervention to promote further functional independence.    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

## 2022-11-29 NOTE — Progress Notes (Signed)
Patient ID: KEATON RANDLES, male   DOB: 06/26/1970, 52 y.o.   MRN: [id number] Met with the patient to review current situation, rehab process, team conference and plan of care. Patient noted he lives w sister who works at [employer]. Home by self and manages meds via pill box. Discussed medications; patient unable to decifer rason for meds; cannot remember. Aware he is taking meds for blood pressure and heart. Left shin wound with discoloration bil shins with LE edema. Torsemide added per MD, KTEDs in room, unable to apply solo.  Reviewed dietary modification recommendations; aware of need to monitor sodium intake. Continue to follow along to address educational needs to facilitate preparation for discharge. Pamelia Hoit

## 2022-11-30 DIAGNOSIS — I10 Essential (primary) hypertension: Secondary | ICD-10-CM | POA: Diagnosis not present

## 2022-11-30 DIAGNOSIS — R739 Hyperglycemia, unspecified: Secondary | ICD-10-CM

## 2022-11-30 DIAGNOSIS — I639 Cerebral infarction, unspecified: Secondary | ICD-10-CM | POA: Diagnosis not present

## 2022-11-30 LAB — GLUCOSE, CAPILLARY
Glucose-Capillary: 85 mg/dL (ref 70–99)
Glucose-Capillary: 90 mg/dL (ref 70–99)
Glucose-Capillary: 144 mg/dL — ABNORMAL HIGH (ref 70–99)
Glucose-Capillary: 132 mg/dL — ABNORMAL HIGH (ref 70–99)

## 2022-11-30 NOTE — Progress Notes (Signed)
Occupational Therapy Session Note  Patient Details  Name: Jeremiah Vance MRN: [id number] Date of Birth: 10-Aug-1970  Today's Date: 11/30/2022 OT Individual Time: 2952-8413 OT Individual Time Calculation (min): 59 min   Short Term Goals: Week 1:  OT Short Term Goal 1 (Week 1): STG=LTG d/t ELOS  Skilled Therapeutic Interventions/Progress Updates:    Pt greeted semi-reclined in bed and reported need to go to the bathroom. Pt completed bed mobility with supervision. He stood w/ RW and ambulated to bathroom with CGA. Pt voided bowel and bladder and completed peri-care with CGA. Shower completed sit<>stand with tub bench. He needed CGA for balance when standing to wash buttocks and peri-area. Pt needed min A to thread pant legs, then CGA for balance when standing to pull pants up. Pt ambulated out of shower with RW and education provided on RW position at the sink to stand and brush teeth. Pt sat EOB to finish dressing with min A. Pt ambulated 60 feet w/ RW to therapy gym w/ CGA. UB there-ex using 2 lb weighted bar 3 sets of 10 chest press, bicep curl. Pt took rest break, then ambulated back to room another 60 feet in similar fashion. Pt left seated in recliner with alarm belt on, call bell in reach, and needs met.   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 Rest and repositioned for pain management   Therapy/Group: Individual Therapy  Mal Amabile 11/30/2022, 9:31 AM

## 2022-11-30 NOTE — Plan of Care (Signed)
  Problem: Consults Goal: RH STROKE PATIENT EDUCATION Description: See Patient Education module for education specifics  Outcome: Progressing   Problem: RH BOWEL ELIMINATION Goal: RH STG MANAGE BOWEL WITH ASSISTANCE Description: STG Manage Bowel with min Assistance. Outcome: Progressing Goal: RH STG MANAGE BOWEL W/MEDICATION W/ASSISTANCE Description: STG Manage Bowel with Medication with min Assistance. Outcome: Progressing   Problem: RH BLADDER ELIMINATION Goal: RH STG MANAGE BLADDER WITH ASSISTANCE Description: STG Manage Bladder With min Assistance Outcome: Progressing   Problem: RH SKIN INTEGRITY Goal: RH STG SKIN FREE OF INFECTION/BREAKDOWN Description: Incision will improve and be free of infection/breakdown with min assist  Outcome: Progressing Goal: RH STG ABLE TO PERFORM INCISION/WOUND CARE W/ASSISTANCE Description: STG Able To Perform Incision/Wound Care With min Assistance. Outcome: Progressing   Problem: RH SAFETY Goal: RH STG ADHERE TO SAFETY PRECAUTIONS W/ASSISTANCE/DEVICE Description: STG Adhere to Safety Precautions With min Assistance/Device. Outcome: Progressing Goal: RH STG DECREASED RISK OF FALL WITH ASSISTANCE Description: STG Decreased Risk of Fall With cueing Assistance. Outcome: Progressing   Problem: RH PAIN MANAGEMENT Goal: RH STG PAIN MANAGED AT OR BELOW PT'S PAIN GOAL Description: Pain will be managed at 4 out of 10 on pain scale with PRN medications min assist  Outcome: Progressing   Problem: RH KNOWLEDGE DEFICIT Goal: RH STG INCREASE KNOWLEDGE OF HYPERTENSION Description: Patient/caregiver will be able to manage medications diet/lifestyle modifications to improve HTN from nursing education and nursing handouts independently  Outcome: Progressing Goal: RH STG INCREASE KNOWLEGDE OF HYPERLIPIDEMIA Description: Patient/caregiver will be able to manage cholesterol medications and diet/lifestyle modifications to improve cholesterol levels from  nursing educations and nursing handouts independently  Outcome: Progressing Goal: RH STG INCREASE KNOWLEDGE OF STROKE PROPHYLAXIS Description: Patient/caregiver will be able to manage Eliquis to assist as stroke and Afib prevention from nursing education and nursing handouts independently   Outcome: Progressing   Problem: Education: Goal: Knowledge of disease or condition will improve Outcome: Progressing Goal: Knowledge of secondary prevention will improve (MUST DOCUMENT ALL) Outcome: Progressing Goal: Knowledge of patient specific risk factors will improve Loraine Leriche N/A or DELETE if not current risk factor) Outcome: Progressing   Problem: Ischemic Stroke/TIA Tissue Perfusion: Goal: Complications of ischemic stroke/TIA will be minimized Outcome: Progressing   Problem: Coping: Goal: Will verbalize positive feelings about self Outcome: Progressing Goal: Will identify appropriate support needs Outcome: Progressing   Problem: Health Behavior/Discharge Planning: Goal: Ability to manage health-related needs will improve Outcome: Progressing Goal: Goals will be collaboratively established with patient/family Outcome: Progressing   Problem: Self-Care: Goal: Ability to participate in self-care as condition permits will improve Outcome: Progressing Goal: Verbalization of feelings and concerns over difficulty with self-care will improve Outcome: Progressing Goal: Ability to communicate needs accurately will improve Outcome: Progressing   Problem: Nutrition: Goal: Risk of aspiration will decrease Outcome: Progressing Goal: Dietary intake will improve Outcome: Progressing

## 2022-11-30 NOTE — Progress Notes (Signed)
PROGRESS NOTE   Subjective/Complaints:  Pt doing well today. Slept ok. Pain well managed. LBM today. Urinating fine. Denies any other complaints or concerns today.   ROS- neg for CP, SOB, N/V/D/C, denies swallowing issues   Objective:   No results found. Recent Labs    11/28/22 0431  WBC 5.6  HGB 9.5*  HCT 30.4*  PLT 343   Recent Labs    11/28/22 0431  NA 137  K 4.1  CL 101  CO2 25  GLUCOSE 124*  BUN 26*  CREATININE 1.43*  CALCIUM 8.5*    Intake/Output Summary (Last 24 hours) at 11/30/2022 1231 Last data filed at 11/30/2022 0806 Gross per 24 hour  Intake 726 ml  Output --  Net 726 ml        Physical Exam: Vital Signs Blood pressure (!) 143/83, pulse 75, temperature 98 F (36.7 C), temperature source Oral, resp. rate 19, height 5\' 11"  (1.803 m), weight 109.4 kg, SpO2 95%.   General: No acute distress, sitting in chair Mood and affect are appropriate Heart: Regular rate and rhythm no rubs murmurs or extra sounds Lungs: Clear to auscultation, breathing unlabored, no rales or wheezes Abdomen: Positive bowel sounds, soft nontender to palpation, nondistended Extremities: No clubbing, cyanosis, 2+ pretibial edema, mepilex on LLE Skin: No evidence of breakdown, no evidence of rash, pretibial stasis dermatitis with hyperpigmentation, mepilex on LLE area  PRIOR EXAM: Neurologic: Cranial nerves II through XII intact, motor strength is 3-/5 in left and 4/5 right  deltoid, bicep, tricep, grip, hip flexor, knee extensors, ankle dorsiflexor and plantar flexor Sensory exam normal sensation to light touch  in bilateral upper and lower extremities, although some word finding issues limit exam  Pt unable to name body parts accurately  Cerebellar exam normal finger to nose to finger as well as heel to shin in bilateral upper and lower extremities Musculoskeletal: Full range of motion in all 4 extremities. No joint  swelling   Assessment/Plan: 1. Functional deficits which require 3+ hours per day of interdisciplinary therapy in a comprehensive inpatient rehab setting. Physiatrist is providing close team supervision and 24 hour management of active medical problems listed below. Physiatrist and rehab team continue to assess barriers to discharge/monitor patient progress toward functional and medical goals  Care Tool:  Bathing    Body parts bathed by patient: Right arm, Left arm, Chest, Abdomen, Front perineal area, Buttocks, Right upper leg, Left upper leg, Right lower leg, Left lower leg, Face         Bathing assist Assist Level: Contact Guard/Touching assist     Upper Body Dressing/Undressing Upper body dressing   What is the patient wearing?: Pull over shirt    Upper body assist Assist Level: Contact Guard/Touching assist    Lower Body Dressing/Undressing Lower body dressing      What is the patient wearing?: Pants, Underwear/pull up     Lower body assist Assist for lower body dressing: Contact Guard/Touching assist     Toileting Toileting    Toileting assist Assist for toileting: Supervision/Verbal cueing     Transfers Chair/bed transfer  Transfers assist     Chair/bed transfer assist level: Contact Guard/Touching assist  Chair/bed transfer assistive device: Arboriculturist assist      Assist level: Contact Guard/Touching assist Assistive device: Walker-rolling Max distance: 82ft   Walk 10 feet activity   Assist     Assist level: Contact Guard/Touching assist Assistive device: Walker-rolling   Walk 50 feet activity   Assist    Assist level: Contact Guard/Touching assist Assistive device: Walker-rolling    Walk 150 feet activity   Assist Walk 150 feet activity did not occur: Safety/medical concerns         Walk 10 feet on uneven surface  activity   Assist Walk 10 feet on uneven surfaces activity did not  occur: Safety/medical concerns (fatigue)         Wheelchair     Assist Is the patient using a wheelchair?: Yes Type of Wheelchair: Manual (for transport)    Wheelchair assist level: Dependent - Patient 0%      Wheelchair 50 feet with 2 turns activity    Assist        Assist Level: Dependent - Patient 0%   Wheelchair 150 feet activity     Assist      Assist Level: Dependent - Patient 0%   Blood pressure (!) 143/83, pulse 75, temperature 98 F (36.7 C), temperature source Oral, resp. rate 19, height 5\' 11"  (1.803 m), weight 109.4 kg, SpO2 95%.  Medical Problem List and Plan: 1. 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 Pre existing)              -patient may shower             -ELOS/Goals: Supervision with PT and OT, 10 to 14 days             -Continue CIR 2.  Antithrombotics: -DVT/anticoagulation:  Pharmaceutical: Eliquis 2.5mg  BID             -antiplatelet therapy: N/A 3. Pain Management: Neurontin 100 mg twice daily, oxycodone as needed             -Increase Neurontin to 200mg  BID 4. Mood/Behavior/Sleep: Zoloft 50 mg daily, Lamictal 25 mg nightly for now; BuSpar 7.5 mg twice daily             -antipsychotic agents: N/A 5. Neuropsych/cognition: This patient is capable of making decisions on his own behalf. 6. Skin/Wound Care: Routine skin checks 7. Fluids/Electrolytes/Nutrition: Routine in and outs with follow-up chemistries 8.  Paroxysmal atrial fibrillation.  Continue Eliquis 2.5 mg twice daily.  Cardiac rate controlled 9.  GE junction carcinoma stage IV.  Chemotherapy per Dr.Yu.  Chemotherapy currently on hold follow-up outpatient 10.  Chronic combined systolic and diastolic congestive heart failure.  Continue Demadex 40 mg  daily as needed (getting 20mg  daily now).  Monitor for any signs of fluid overload.  Daily weights  -11/30/22 wt stable, edema at baseline per pt, monitor Filed Weights   11/27/22 1304 11/30/22 0500  Weight: 109.8 kg 109.4 kg    11.  Diabetes mellitus.  Latest hemoglobin A1c 7.0.  Currently on SSI.  Patient on Farxiga 10 mg daily prior to admission.  Resumed here  -11/30/22 CBGs looking good, cont regimen CBG (last 3)  Recent Labs    11/29/22 2058 11/30/22 0639 11/30/22 1159  GLUCAP 114* 85 90    12.  Obesity.  BMI 35.11.  Dietary follow-up 13.  CKD stage IIIb.  Cr 1.43 on 8/29, near baseline; monitor on routine labs 14.  Hyperlipidemia.  Lipitor 40mg  daily 15.  Hypomagnesemia.  1.9 on 11/28/22, monitor PRN 16.  Chronic anemia.  Hgb 9.5 11/28/22, stable, monitor routinely 17. Obesity. Dietary counseling: Body mass index is 33.76 kg/m. 18.  Essential hypertension.   -Elevated systolic torsemide ordered as prn but has not received since admit, does have LE edema will start torsemide 20mg  per day  -11/30/22 BPs downtrending, monitor Vitals:   11/27/22 1304 11/27/22 1929 11/28/22 0557 11/28/22 1346  BP: (!) 151/86 (!) 153/95 (!) 141/90 (!) 142/80   11/28/22 1959 11/29/22 0525 11/29/22 1339 11/29/22 1936  BP: (!) 152/90 (!) 152/84 (!) 135/91 (!) 136/95   11/30/22 0507  BP: (!) 143/83     LOS: 3 days A FACE TO FACE EVALUATION WAS PERFORMED  [address] 11/30/2022, 12:31 PM

## 2022-11-30 NOTE — Progress Notes (Signed)
Physical Therapy Session Note  Patient Details  Name: Jeremiah Vance MRN: [id number] Date of Birth: 07-16-1970  Today's Date: 11/30/2022 PT Individual Time: 1100-1158 + 1345-1425 PT Individual Time Calculation (min): 58 min  + 40 min  Short Term Goals: Week 1:  PT Short Term Goal 1 (Week 1): = to LTGs based on ELOS  Skilled Therapeutic Interventions/Progress Updates:      1st session: Pt sitting in recliner - agreeable to therapy session. Denies pain. Sit<>stand to RW with supervision from recliner height - ambulates within his room with CGA and RW to the wheelchair with cues for safety approach.   Pt transported to main rehab gym for time. Sit<>stands from w/c to RW with supervision assist, cues for hand placement only. Ambulates throughout session with mostly supervision assist, intermittent CGA as needed with turns and fatigue - ambulates up to ~246ft distances before he requests to sit. Several bouts of walking b/w therapy gyms throughout session.  Practiced ambulating in ADL apartment with no AD and "furniture walking" as this is what he tends to do at home, leaving his RW one floor of townhome. Patient ambulates with CGA and no AD in ADL apartment, frequent stubbing his feet or knees on edges of furniture due to visual deficits. Pt reports he doesn't typically do this at home because he knows where everything is due to familiarity.   In ortho rehab gym, completed car transfer with car height replicating his RAV4 - CGA for safety as he enters the car via side stepping. Educated on safety approach to avoid falls risk by sitting before entering legs.   Assisted onto the Nustep and he completed 10 minutes at L10 resistance, using BUE/BLE and listening to self selected music to help keep cadence >40 steps/minute.   Pt reporting urgent need to use bathroom. Returned to his room and ambulated within room to the bathroom toilet with supervision and RW - able to manage shorts/briefs in  standing without assist. Continent of bladder while sitting. Returned to recliner in his room with supervision and RW with cues only for safety approach due to vision deficits. Safety belt alarm on and call bell within reach at end of treatment.    2nd session: Patient's sister, Jeremiah Vance, present for session for family education and training. Pt denies any pain.   Sit<>stand to RW with distant supervision. He defer's wheelchair transport and ambulates to main rehab gym, ~29ft, with supervision and RW. During this, reviewed with his sister PT goals, PT POC, f/u therapies, fall prevention, home safety, etc. Both patient and sister feel as though he's moving better than he did before he was hospitalized. Reviewed stair training with 6" steps and 1 hand rail, navigating x15 steps with supervision while side stepping going up and down - no knee buckling or instability observed. Ambulated to ortho rehab gym with supervision and RW, ~148ft, slightly more fatigued and slower gait speed compared to start of session. Car transfer completed with emphasis on safety approach and avoid lateral stepping to get in vehicle. After brief seated rest break, pt ambulated from ortho gym back to his room, >290ft, with supervision and RW - notably fatigued with decreased speed and increased effort but no knee buckling or LOB observed. Concluded session seated in recliner with seat belt alarm on and call bell within reach, sister at the bedside. Both feel comfortable and confident regarding DC plan and pt's progress.    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

## 2022-11-30 NOTE — Plan of Care (Signed)
  Problem: RH PAIN MANAGEMENT Goal: RH STG PAIN MANAGED AT OR BELOW PT'S PAIN GOAL Description: Pain will be managed at 4 out of 10 on pain scale with PRN medications min assist  Outcome: Progressing   Problem: RH KNOWLEDGE DEFICIT Goal: RH STG INCREASE KNOWLEDGE OF HYPERTENSION Description: Patient/caregiver will be able to manage medications diet/lifestyle modifications to improve HTN from nursing education and nursing handouts independently  Outcome: Progressing Goal: RH STG INCREASE KNOWLEGDE OF HYPERLIPIDEMIA Description: Patient/caregiver will be able to manage cholesterol medications and diet/lifestyle modifications to improve cholesterol levels from nursing educations and nursing handouts independently  Outcome: Progressing

## 2022-12-01 DIAGNOSIS — I639 Cerebral infarction, unspecified: Secondary | ICD-10-CM | POA: Diagnosis not present

## 2022-12-01 DIAGNOSIS — R739 Hyperglycemia, unspecified: Secondary | ICD-10-CM | POA: Diagnosis not present

## 2022-12-01 DIAGNOSIS — I10 Essential (primary) hypertension: Secondary | ICD-10-CM | POA: Diagnosis not present

## 2022-12-01 LAB — GLUCOSE, CAPILLARY
Glucose-Capillary: 86 mg/dL (ref 70–99)
Glucose-Capillary: 120 mg/dL — ABNORMAL HIGH (ref 70–99)
Glucose-Capillary: 154 mg/dL — ABNORMAL HIGH (ref 70–99)
Glucose-Capillary: 146 mg/dL — ABNORMAL HIGH (ref 70–99)

## 2022-12-01 NOTE — Progress Notes (Signed)
PROGRESS NOTE   Subjective/Complaints:  Pt doing well  again today. Slept good. Pain well managed. LBM yesterday. Urinating fine. Denies any other complaints or concerns today.   ROS- neg for CP, SOB, N/V/D/C, denies swallowing issues   Objective:   No results found. No results for input(s): "WBC", "HGB", "HCT", "PLT" in the last 72 hours.  No results for input(s): "NA", "K", "CL", "CO2", "GLUCOSE", "BUN", "CREATININE", "CALCIUM" in the last 72 hours.   Intake/Output Summary (Last 24 hours) at 12/01/2022 1203 Last data filed at 12/01/2022 0735 Gross per 24 hour  Intake 354 ml  Output --  Net 354 ml        Physical Exam: Vital Signs Blood pressure 132/87, pulse 77, temperature 98.7 F (37.1 C), temperature source Oral, resp. rate 16, height 5\' 11"  (1.803 m), weight 108.8 kg, SpO2 96%.   General: No acute distress, laying in bed Mood and affect are appropriate Heart: Regular rate and rhythm no rubs murmurs or extra sounds Lungs: Clear to auscultation, breathing unlabored, no rales or wheezes Abdomen: Positive bowel sounds, soft nontender to palpation, nondistended, +ventral hernia (chronic per pt) Extremities: No clubbing, cyanosis, 1+ pretibial edema, mepilex on LLE Skin: No evidence of breakdown, no evidence of rash, pretibial stasis dermatitis with hyperpigmentation, mepilex on LLE area  PRIOR EXAM: Neurologic: Cranial nerves II through XII intact, motor strength is 3-/5 in left and 4/5 right  deltoid, bicep, tricep, grip, hip flexor, knee extensors, ankle dorsiflexor and plantar flexor Sensory exam normal sensation to light touch  in bilateral upper and lower extremities, although some word finding issues limit exam  Pt unable to name body parts accurately  Cerebellar exam normal finger to nose to finger as well as heel to shin in bilateral upper and lower extremities Musculoskeletal: Full range of motion in all 4  extremities. No joint swelling   Assessment/Plan: 1. Functional deficits which require 3+ hours per day of interdisciplinary therapy in a comprehensive inpatient rehab setting. Physiatrist is providing close team supervision and 24 hour management of active medical problems listed below. Physiatrist and rehab team continue to assess barriers to discharge/monitor patient progress toward functional and medical goals  Care Tool:  Bathing    Body parts bathed by patient: Right arm, Left arm, Chest, Abdomen, Front perineal area, Buttocks, Right upper leg, Left upper leg, Right lower leg, Left lower leg, Face         Bathing assist Assist Level: Contact Guard/Touching assist     Upper Body Dressing/Undressing Upper body dressing   What is the patient wearing?: Pull over shirt    Upper body assist Assist Level: Contact Guard/Touching assist    Lower Body Dressing/Undressing Lower body dressing      What is the patient wearing?: Pants, Underwear/pull up     Lower body assist Assist for lower body dressing: Contact Guard/Touching assist     Toileting Toileting    Toileting assist Assist for toileting: Supervision/Verbal cueing     Transfers Chair/bed transfer  Transfers assist     Chair/bed transfer assist level: Contact Guard/Touching assist Chair/bed transfer assistive device: Geologist, engineering   Ambulation assist  Assist level: Contact Guard/Touching assist Assistive device: Walker-rolling Max distance: 58ft   Walk 10 feet activity   Assist     Assist level: Contact Guard/Touching assist Assistive device: Walker-rolling   Walk 50 feet activity   Assist    Assist level: Contact Guard/Touching assist Assistive device: Walker-rolling    Walk 150 feet activity   Assist Walk 150 feet activity did not occur: Safety/medical concerns         Walk 10 feet on uneven surface  activity   Assist Walk 10 feet on uneven  surfaces activity did not occur: Safety/medical concerns (fatigue)         Wheelchair     Assist Is the patient using a wheelchair?: Yes Type of Wheelchair: Manual (for transport)    Wheelchair assist level: Dependent - Patient 0%      Wheelchair 50 feet with 2 turns activity    Assist        Assist Level: Dependent - Patient 0%   Wheelchair 150 feet activity     Assist      Assist Level: Dependent - Patient 0%   Blood pressure 132/87, pulse 77, temperature 98.7 F (37.1 C), temperature source Oral, resp. rate 16, height 5\' 11"  (1.803 m), weight 108.8 kg, SpO2 96%.  Medical Problem List and Plan: 1. 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 Pre existing)              -patient may shower             -ELOS/Goals: Supervision with PT and OT, 10 to 14 days             -Continue CIR 2.  Antithrombotics: -DVT/anticoagulation:  Pharmaceutical: Eliquis 2.5mg  BID             -antiplatelet therapy: N/A 3. Pain Management: Neurontin 100 mg twice daily, oxycodone as needed             -Increase Neurontin to 200mg  BID 4. Mood/Behavior/Sleep: Zoloft 50 mg daily, Lamictal 25 mg nightly for now; BuSpar 7.5 mg twice daily             -antipsychotic agents: N/A 5. Neuropsych/cognition: This patient is capable of making decisions on his own behalf. 6. Skin/Wound Care: Routine skin checks 7. Fluids/Electrolytes/Nutrition: Routine in and outs with follow-up chemistries 8.  Paroxysmal atrial fibrillation.  Continue Eliquis 2.5 mg twice daily.  Cardiac rate controlled 9.  GE junction carcinoma stage IV.  Chemotherapy per Dr.Yu.  Chemotherapy currently on hold follow-up outpatient 10.  Chronic combined systolic and diastolic congestive heart failure.   Continue Demadex 40 mg daily as needed (getting 20mg  daily as of 11/29/22).  Monitor for any signs of fluid overload.  Daily weights  -8/31-9/1 wt stable, edema at baseline per pt/a little better, monitor Filed Weights   11/27/22 1304 11/30/22 0500 12/01/22 0500  Weight: 109.8 kg 109.4 kg 108.8 kg    11.  Diabetes mellitus.  Latest hemoglobin A1c 7.0.  Currently on SSI.  Patient on Farxiga 10 mg daily prior to admission.  Resumed here  -8/31-9/1 CBGs looking good, cont regimen CBG (last 3)  Recent Labs    11/30/22 2034 12/01/22 0653 12/01/22 1155  GLUCAP 132* 86 120*    12.  Obesity.  BMI 35.11.  Dietary follow-up 13.  CKD stage IIIb.  Cr 1.43 on 8/29, near baseline; monitor on routine labs 14.  Hyperlipidemia.  Lipitor 40mg  daily 15.  Hypomagnesemia.  1.9 on 11/28/22, monitor PRN 16.  Chronic anemia.  Hgb 9.5 11/28/22, stable, monitor routinely 17. Obesity. Dietary counseling: Body mass index is 33.76 kg/m. 18.  Essential hypertension.   -8/30 Elevated systolic torsemide ordered as prn but has not received since admit, does have LE edema will start torsemide 20mg  per day  -8/31-9/1 BPs downtrending, monitor Vitals:   11/27/22 1304 11/27/22 1929 11/28/22 0557 11/28/22 1346  BP: (!) 151/86 (!) 153/95 (!) 141/90 (!) 142/80   11/28/22 1959 11/29/22 0525 11/29/22 1339 11/29/22 1936  BP: (!) 152/90 (!) 152/84 (!) 135/91 (!) 136/95   11/30/22 0507 11/30/22 1346 11/30/22 1942 12/01/22 0442  BP: (!) 143/83 139/63 (!) 142/80 132/87     LOS: 4 days A FACE TO FACE EVALUATION WAS PERFORMED  [place] 12/01/2022, 12:03 PM

## 2022-12-02 DIAGNOSIS — I639 Cerebral infarction, unspecified: Secondary | ICD-10-CM | POA: Diagnosis not present

## 2022-12-02 LAB — BASIC METABOLIC PANEL
Anion gap: 10 (ref 5–15)
BUN: 31 mg/dL — ABNORMAL HIGH (ref 6–20)
CO2: 31 mmol/L (ref 22–32)
Calcium: 8.5 mg/dL — ABNORMAL LOW (ref 8.9–10.3)
Chloride: 95 mmol/L — ABNORMAL LOW (ref 98–111)
Creatinine, Ser: 1.69 mg/dL — ABNORMAL HIGH (ref 0.61–1.24)
GFR, Estimated: 48 mL/min — ABNORMAL LOW (ref >60)
Glucose, Bld: 111 mg/dL — ABNORMAL HIGH (ref 70–99)
Potassium: 4.2 mmol/L (ref 3.5–5.1)
Sodium: 136 mmol/L (ref 135–145)

## 2022-12-02 LAB — CBC
HCT: 32.2 % — ABNORMAL LOW (ref 39.0–52.0)
Hemoglobin: 9.7 g/dL — ABNORMAL LOW (ref 13.0–17.0)
MCH: 25.1 pg — ABNORMAL LOW (ref 26.0–34.0)
MCHC: 30.1 g/dL (ref 30.0–36.0)
MCV: 83.4 fL (ref 80.0–100.0)
Platelets: 439 10*3/uL — ABNORMAL HIGH (ref 150–400)
RBC: 3.86 MIL/uL — ABNORMAL LOW (ref 4.22–5.81)
RDW: 18.8 % — ABNORMAL HIGH (ref 11.5–15.5)
WBC: 5.8 10*3/uL (ref 4.0–10.5)
nRBC: 0 % (ref 0.0–0.2)

## 2022-12-02 LAB — GLUCOSE, CAPILLARY
Glucose-Capillary: 107 mg/dL — ABNORMAL HIGH (ref 70–99)
Glucose-Capillary: 99 mg/dL (ref 70–99)
Glucose-Capillary: 121 mg/dL — ABNORMAL HIGH (ref 70–99)
Glucose-Capillary: 138 mg/dL — ABNORMAL HIGH (ref 70–99)

## 2022-12-02 MED ORDER — DICLOFENAC SODIUM 1 % EX GEL
4.0000 g | Freq: Four times a day (QID) | CUTANEOUS | Status: DC
  Administered 2022-12-02 – 2022-12-07 (×13): 4 g via TOPICAL
  Filled 2022-12-02: qty 100

## 2022-12-02 MED ORDER — TORSEMIDE 20 MG PO TABS
10.0000 mg | ORAL_TABLET | Freq: Every day | ORAL | Status: DC
  Administered 2022-12-03 – 2022-12-06 (×4): 10 mg via ORAL
  Filled 2022-12-02 (×4): qty 1

## 2022-12-02 NOTE — Progress Notes (Addendum)
Physical Therapy Session Note  Patient Details  Name: Jeremiah Vance MRN: [id number] Date of Birth: June 13, 1970  Today's Date: 12/02/2022 PT Individual Time: 1104-1201 PT Individual Time Calculation (min): 57 min   Short Term Goals: Week 1:  PT Short Term Goal 1 (Week 1): = to LTGs based on ELOS  Skilled Therapeutic Interventions/Progress Updates: Pt presents standing in room and agreeable to therapy.  Pt amb to dayroom x 150' w/ RW and CGA/close supervision.  Pt transferring to/from armless chair w/ close supervision.  Pt amb x 3' w/o AD to Nu-step.  Performed Nu-step at Level 5 x 3" and continued x 2" at Level 3.  Pt required rest before completing 5" at Level 3 for a total of 267 steps and average of 27 spm.  Pt amb w/o AD x 60' including turns to return to seat w/ CGA after given a goal and any directional cueing needed.  Pt amb w/ RW x 200' w/ CGA/close supervision.  Pt returned to room to w/c.  Pt amb around bed to window w/o AD and then wished to remain sitting in recliner w/ all needs in reach.     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

## 2022-12-02 NOTE — Progress Notes (Signed)
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 PT educated pt to keep safety chair alarm on. Pt with unrated bilateral knee pain, provided rest breaks for relief.   Pt CGA with sit to stand and gait ~ 125 ft with RW to stairwell in main gym hallway. Pt navigated 11 steps x 2 with CGA. Pt ascends steps with forward stepping strategy and descends with side step strategy. Pt fatigues quickly and requires seated rest break at top of stairs for rest.   Pt dependently transported by w/c to room and left seated in recliner with all needs in reach and chair alarm on.     Therapy/Group: Individual Therapy  Truitt Leep Truitt Leep PT, DPT  12/02/2022, 10:25 AM

## 2022-12-02 NOTE — Progress Notes (Signed)
PROGRESS NOTE   Subjective/Complaints:  THerapy notes R>L knee pain , per pt this is chronic, has not tried diclofenac in past  Reviewed labs as well as intake of fluids   ROS- neg for CP, SOB, N/V/D/C, denies swallowing issues   Objective:   No results found. Recent Labs    12/02/22 0647  WBC 5.8  HGB 9.7*  HCT 32.2*  PLT 439*    Recent Labs    12/02/22 0647  NA 136  K 4.2  CL 95*  CO2 31  GLUCOSE 111*  BUN 31*  CREATININE 1.69*  CALCIUM 8.5*     Intake/Output Summary (Last 24 hours) at 12/02/2022 8295 Last data filed at 12/02/2022 0806 Gross per 24 hour  Intake 1200 ml  Output 250 ml  Net 950 ml        Physical Exam: Vital Signs Blood pressure (!) 164/86, pulse 77, temperature 98.3 F (36.8 C), temperature source Oral, resp. rate 18, height 5\' 11"  (1.803 m), weight 108 kg, SpO2 96%.   General: No acute distress, laying in bed Mood and affect are appropriate Heart: Regular rate and rhythm no rubs murmurs or extra sounds Lungs: Clear to auscultation, breathing unlabored, no rales or wheezes Abdomen: Positive bowel sounds, soft nontender to palpation, nondistended, +ventral hernia (chronic per pt) Extremities: No clubbing, cyanosis, 1+ pretibial edema, mepilex on LLE Skin: No evidence of breakdown, no evidence of rash, pretibial stasis dermatitis with hyperpigmentation, mepilex on LLE area  PRIOR EXAM: Neurologic: Cranial nerves II through XII intact, motor strength is 3-/5 in left and 4/5 right  deltoid, bicep, tricep, grip, hip flexor, knee extensors, ankle dorsiflexor and plantar flexor Sensory exam normal sensation to light touch  in bilateral upper and lower extremities, although some word finding issues limit exam  Pt unable to name body parts accurately  Cerebellar exam normal finger to nose to finger as well as heel to shin in bilateral upper and lower extremities Musculoskeletal: Full range  of motion in all 4 extremities. No joint swelling   Assessment/Plan: 1. Functional deficits which require 3+ hours per day of interdisciplinary therapy in a comprehensive inpatient rehab setting. Physiatrist is providing close team supervision and 24 hour management of active medical problems listed below. Physiatrist and rehab team continue to assess barriers to discharge/monitor patient progress toward functional and medical goals  Care Tool:  Bathing    Body parts bathed by patient: Right arm, Left arm, Chest, Abdomen, Front perineal area, Buttocks, Right upper leg, Left upper leg, Right lower leg, Left lower leg, Face         Bathing assist Assist Level: Contact Guard/Touching assist     Upper Body Dressing/Undressing Upper body dressing   What is the patient wearing?: Pull over shirt    Upper body assist Assist Level: Contact Guard/Touching assist    Lower Body Dressing/Undressing Lower body dressing      What is the patient wearing?: Pants, Underwear/pull up     Lower body assist Assist for lower body dressing: Contact Guard/Touching assist     Toileting Toileting    Toileting assist Assist for toileting: Supervision/Verbal cueing     Transfers Chair/bed transfer  Transfers assist     Chair/bed transfer assist level: Contact Guard/Touching assist Chair/bed transfer assistive device: Geologist, engineering   Ambulation assist      Assist level: Contact Guard/Touching assist Assistive device: Walker-rolling Max distance: 70ft   Walk 10 feet activity   Assist     Assist level: Contact Guard/Touching assist Assistive device: Walker-rolling   Walk 50 feet activity   Assist    Assist level: Contact Guard/Touching assist Assistive device: Walker-rolling    Walk 150 feet activity   Assist Walk 150 feet activity did not occur: Safety/medical concerns         Walk 10 feet on uneven surface  activity   Assist Walk 10  feet on uneven surfaces activity did not occur: Safety/medical concerns (fatigue)         Wheelchair     Assist Is the patient using a wheelchair?: Yes Type of Wheelchair: Manual (for transport)    Wheelchair assist level: Dependent - Patient 0%      Wheelchair 50 feet with 2 turns activity    Assist        Assist Level: Dependent - Patient 0%   Wheelchair 150 feet activity     Assist      Assist Level: Dependent - Patient 0%   Blood pressure (!) 164/86, pulse 77, temperature 98.3 F (36.8 C), temperature source Oral, resp. rate 18, height 5\' 11"  (1.803 m), weight 108 kg, SpO2 96%.  Medical Problem List and Plan: 1. 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 Pre existing)              -patient may shower             -ELOS/Goals: Supervision with PT and OT, expect d/c on 9/4 or 9/5             -Continue CIR 2.  Antithrombotics: -DVT/anticoagulation:  Pharmaceutical: Eliquis 2.5mg  BID             -antiplatelet therapy: N/A 3. Pain Management: Neurontin 100 mg twice daily, oxycodone as needed             -Increase Neurontin to 200mg  BID 4. Mood/Behavior/Sleep: Zoloft 50 mg daily, Lamictal 25 mg nightly for now; BuSpar 7.5 mg twice daily             -antipsychotic agents: N/A 5. Neuropsych/cognition: This patient is capable of making decisions on his own behalf. 6. Skin/Wound Care: Routine skin checks 7. Fluids/Electrolytes/Nutrition: Routine in and outs with follow-up chemistries 8.  Paroxysmal atrial fibrillation.  Continue Eliquis 2.5 mg twice daily.  Cardiac rate controlled 9.  GE junction carcinoma stage IV.  Chemotherapy per Dr.Yu.  Chemotherapy currently on hold follow-up outpatient 10.  Chronic combined systolic and diastolic  congestive heart failure.  Continue Demadex 40 mg daily as needed (getting 20mg  daily as of 11/29/22).  Monitor for any signs of fluid overload.  Daily weights  -8/31-9/1 wt stable, edema at baseline per pt/a little better, monitor Filed Weights   11/30/22 0500 12/01/22 0500 12/02/22 0358  Weight: 109.4 kg 108.8 kg 108 kg    11.  Diabetes mellitus.  Latest hemoglobin A1c 7.0.  Currently on SSI.  Patient on Farxiga 10 mg daily prior to admission.  Resumed  here  9/2 CBGs looking good, cont regimen CBG (last 3)  Recent Labs    12/01/22 1632 12/01/22 2059 12/02/22 0610  GLUCAP 154* 146* 107*    12.  Obesity.  BMI 35.11.  Dietary follow-up 13.  CKD stage IIIb.  Cr 1.43 on 8/29, near baseline; monitor on routine labs    Latest Ref Rng & Units 12/02/2022    6:47 AM 11/28/2022    4:31 AM 11/27/2022    4:47 AM  BMP  Glucose 70 - 99 mg/dL 409  811  914   BUN 6 - 20 mg/dL 31  26  31    Creatinine 0.61 - 1.24 mg/dL 7.82  9.56  2.13   Sodium 135 - 145 mmol/L 136  137  138   Potassium 3.5 - 5.1 mmol/L 4.2  4.1  4.2   Chloride 98 - 111 mmol/L 95  101  102   CO2 22 - 32 mmol/L 31  25  25    Calcium 8.9 - 10.3 mg/dL 8.5  8.5  8.4   Intakes ~500-863mL per day creat creeping up will reduce demadex to 10mg    14.  Hyperlipidemia.  Lipitor 40mg  daily 15.  Hypomagnesemia.  1.9 on 11/28/22, monitor PRN 16.  Chronic anemia.  Hgb 9.5 11/28/22, stable, monitor routinely Hemoglobin & Hematocrit - check stool OB , stable vs prior value     Component Value Date/Time   HGB 9.7 (L) 12/02/2022 0647   HGB 9.2 (L) 11/12/2022 0850   HGB 11.6 (L) 05/23/2022 1606   HCT 32.2 (L) 12/02/2022 0647   HCT 35.8 (L) 05/23/2022 1606    17. Obesity. Dietary counseling: Body mass index is 33.76 kg/m. 18.  Essential hypertension.   -elevated this am , appears to be outlier will cont to monitor on current meds  9/2 Vitals:   11/28/22 1346 11/28/22 1959 11/29/22 0525 11/29/22 1339  BP: (!) 142/80 (!) 152/90 (!) 152/84 (!)  135/91   11/29/22 1936 11/30/22 0507 11/30/22 1346 11/30/22 1942  BP: (!) 136/95 (!) 143/83 139/63 (!) 142/80   12/01/22 0442 12/01/22 1332 12/01/22 2007 12/02/22 0358  BP: 132/87 123/73 (!) 141/75 (!) 164/86     LOS: 5 days A FACE TO FACE EVALUATION WAS PERFORMED  Jeremiah Vance 12/02/2022, 9:27 AM

## 2022-12-02 NOTE — Progress Notes (Addendum)
Patient ID: Jeremiah Vance, male   DOB: 04-Nov-1970, 52 y.o.   MRN: [id number]  Patient requesting a cup of ice. No other questions or concerns.   SW left Vm with sister to discuss potential d/c of Wednesday or Thursday. SW will wait for FU.

## 2022-12-02 NOTE — Progress Notes (Signed)
Physical Therapy Session Note  Patient Details  Name: Jeremiah Vance MRN: [id number] Date of Birth: Jan 08, 1971  Today's Date: 12/02/2022 PT Individual Time: 1500-1545 PT Individual Time Calculation (min): 45 min   Short Term Goals: Week 1:  PT Short Term Goal 1 (Week 1): = to LTGs based on ELOS   Skilled Therapeutic Interventions/Progress Updates:  Patient seated upright in recliner on entrance to room. Patient alert and agreeable to PT session. Alarm belt is attached but behind pt. Relates being able to get out of belt d/t discomfort.   Patient with no pain complaint at start of session.  Therapeutic Activity: Transfers: Pt performed sit<>stand and stand pivot transfers throughout session with close supervision. Provided verbal cues for reaching back to seat for improved positioning prior to descent to sit and for use of RW throughout transfers instead of discard to side prior to descent to sit. .  Gait Training/ Neuromuscular Re-ed::  Pt ambulated 175' x2 using RW with light CGA/ close supervision. Demonstrated slow pace d/t low vision, and manages to clear obstacles with only within close proximity to obstacle. Pt also demos decreased hip/ knee flexion bilaterally.  Neuromuscular Re-ed: NMR facilitated during session with focus on dynamic balance during safety awareness activity. Pt guided in collection of weighted balls from throughout day room from head height to just above floor height.Ambulates with no AD. And light CGA throughout. With assist provided for locating weighted balls, pt is able to place laundry basket for collection on oppsite side of room in order to collect from one side of room, then requires continued vc for location of reamaining weighted balls and collects to basket placed in middle of room. Is able to carry basket with both handles with total weight of ~12lbs back to start. No LOB.   Just prior to ball collection activity, pt relates need to toilet and rises to  stand and initiates gait toward the day room bathroom. Joined pt for supervision, then toilets with Mod I.   NMR performed for improvements in motor control and coordination, balance, sequencing, judgement, and self confidence/ efficacy in performing all aspects of mobility at highest level of independence.   Patient seated in recliner at end of session with brakes locked, no alarm set as pt refuses belt, and all needs within reach. RN notified as to pt's disposition and refusal of alarm belt. Suggested seat pad use.    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   Therapy/Group: Individual Therapy  Loel Dubonnet PT, DPT, CSRS 12/02/2022, 6:02 PM

## 2022-12-02 NOTE — Progress Notes (Signed)
Occupational Therapy Session Note  Patient Details  Name: Jeremiah Vance MRN: [id number] Date of Birth: [birth year]  Today's Date: 12/02/2022 OT Individual Time: 1914-7829 OT Individual Time Calculation (min): 55 min    Short Term Goals: Week 1:  OT Short Term Goal 1 (Week 1): STG=LTG d/t ELOS  Skilled Therapeutic Interventions/Progress Updates:     Pt received sitting up in bed presenting to be in good spirits receptive to skilled OT session reporting 0/10 pain- OT offering intermittent rest breaks, repositioning, and therapeutic support to optimize participation in therapy session. Later during therapy session, Pt reporting pain in his R>L knees- informed RN/MD and requested pain medications. Pt receptive to taking shower this session. Pt able to complete functional mobility to bathroom and ambulatory transfer into shower with close supervision. Set-up BR to simulate home environment with Pt instructed to side step over threshold using RW with Pt able to safety complete with min verbal cues. Pt doffed clothing while seated on TTB distant supervision. Pt required overall distant supervision for U/LB bathing tasks with min verbal cues provided for safety when standing to wash bottom for grab bar use. Discussed home set-up and education provided on bathroom safety with Pt receptive to education and verbalizing understanding. Pt completed U/LB dressing seated in wc with distant supervision- Pt demonstrating improved safety awareness sitting to complete dressing tasks, using RW for balance, and taking increased time to pace out task for energy conservation. Pt completed functional mobility to sink and completed oral hygiene/grooming hygiene tasks standing at sink for increased balance challenge. Pt requesting to take remainder of therapy session in his room. Engaged Pt in UB exercises in standing to work on endurance, balance, and strength deficits. Pt completed 2x10 reps of the following exercises using  4# weighted dowel in standing with seated rest breaks provided between sets- bicep curls, chest press, upright rows, overhead press, and shoulder flexion. Pt presenting with increased pain in R shoulder when completing overhead press during second set with modifications provided and OT providing tactile cues for technique and muscle activation. Pt presenting with decreased activity tolerance this session with education provided on energy conservation techniques and Pt receptive to education. Pt was left resting in recliner with call bell in reach, seat belt alarm on, and all needs met.    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

## 2022-12-03 ENCOUNTER — Encounter: Payer: Self-pay | Admitting: Oncology

## 2022-12-03 ENCOUNTER — Other Ambulatory Visit: Payer: Self-pay | Admitting: Oncology

## 2022-12-03 ENCOUNTER — Telehealth: Payer: Self-pay | Admitting: Oncology

## 2022-12-03 DIAGNOSIS — I639 Cerebral infarction, unspecified: Secondary | ICD-10-CM | POA: Diagnosis not present

## 2022-12-03 DIAGNOSIS — C160 Malignant neoplasm of cardia: Secondary | ICD-10-CM | POA: Diagnosis not present

## 2022-12-03 LAB — OCCULT BLOOD X 1 CARD TO LAB, STOOL: Fecal Occult Bld: NEGATIVE

## 2022-12-03 LAB — GLUCOSE, CAPILLARY
Glucose-Capillary: 159 mg/dL — ABNORMAL HIGH (ref 70–99)
Glucose-Capillary: 111 mg/dL — ABNORMAL HIGH (ref 70–99)
Glucose-Capillary: 102 mg/dL — ABNORMAL HIGH (ref 70–99)
Glucose-Capillary: 57 mg/dL — ABNORMAL LOW (ref 70–99)
Glucose-Capillary: 134 mg/dL — ABNORMAL HIGH (ref 70–99)

## 2022-12-03 MED ORDER — GLUCOSE 40 % PO GEL
ORAL | Status: AC
  Administered 2022-12-03: 37.5 g
  Filled 2022-12-03: qty 1.21

## 2022-12-03 MED ORDER — GLUCOSE 40 % PO GEL
1.0000 | Freq: Once | ORAL | Status: AC
  Administered 2022-12-03: 31 g via ORAL

## 2022-12-03 NOTE — Telephone Encounter (Signed)
Patients sister called to request his appointment be moved back to 9/10- he is getting out of the hospital today. She also requested appointments be on Tuesday sionce she can not bring him Monday. Please advise.

## 2022-12-03 NOTE — Telephone Encounter (Signed)
Results sent to scan.

## 2022-12-03 NOTE — Progress Notes (Signed)
Patient ID: Jeremiah Vance, male   DOB: 11-26-1970, 52 y.o.   MRN: [id number]  Sister, [name] has confirmed that she will be unable to pick up patient on Thursday due to a work conference. Sister will be returning to [facility] Friday evening and will be able to pick up patient for d/c on Saturday.

## 2022-12-03 NOTE — Plan of Care (Signed)
  Problem: Consults Goal: RH STROKE PATIENT EDUCATION Description: See Patient Education module for education specifics  Outcome: Progressing   Problem: RH BOWEL ELIMINATION Goal: RH STG MANAGE BOWEL WITH ASSISTANCE Description: STG Manage Bowel with min Assistance. Outcome: Progressing Goal: RH STG MANAGE BOWEL W/MEDICATION W/ASSISTANCE Description: STG Manage Bowel with Medication with min Assistance. Outcome: Progressing   Problem: RH BLADDER ELIMINATION Goal: RH STG MANAGE BLADDER WITH ASSISTANCE Description: STG Manage Bladder With min Assistance Outcome: Progressing

## 2022-12-03 NOTE — Progress Notes (Signed)
Occupational Therapy Session Note  Patient Details  Name: Jeremiah Vance MRN: [id number] Date of Birth: [birth year]  Today's Date: 12/03/2022 OT Individual Time: 1191-4782 OT Individual Time Calculation (min): 56 min  Session 2:  OT Individual Time: 9562-1308 OT Individual Time Calculation (min): 42 min   Short Term Goals: Week 1:  OT Short Term Goal 1 (Week 1): STG=LTG d/t ELOS  Skilled Therapeutic Interventions/Progress Updates:     AM Session: Pt received semi-reclined in bed lightly sleeping waking upon OT arrival. Pt presenting to be in good spirits receptive to skilled OT session reporting 2/10 chronic pain in back/knees- OT offering intermittent rest breaks, repositioning, and therapeutic support to optimize participation in therapy session. Focus this session dynamic standing balance, activity tolerance, and UB strengthening for increased independence in BADLs.   Pt transitioned to EOB supervision and requested to use BR. Pt completed functional mobility to bathroom using RW and transferred to standard toilet with CGA +increased time- continent void documented in flow sheets. Pt stood at sink for grooming/hygiene tasks using RW close supervision with no LOB noted.   Engaged pt in completing functional mobility to therapy gym using RW for endurance training for higher level IADL tasks with close supervision provided safety. Pt notes to utilize compensatory visually scanning strategies to avoid obstacles with min verbal cues. Seated rest break provided following.   Engaged Pt in completing dynamic standing balance horse shoe throwing activity with blocked practice of sit<>stands and posterior reaching incorporated into task to simulate clothing management and posterior peri-care during BADLs. Pt able to complete activity x2 trials while standing on compliant surface for increased balance challenge with CGA provided for balance completing total of 16 sit<>stands total. Education provided on  body mechanics and technique for anterior weight shifting prior to sit>stand with Pt receptive to education demonstrating teach back as evidence of learning.   Pt completed seated B UE exercises to increase overall strength and endurance required for BADLs. Utilized red medium resistance therband while completing exercises for increased challenge. OT provided visual model of each exercise and tactile/verbal cues for muscle engagement/technique while Pt completed each movement:  -2x10 Chest pulls (shoulder retraction) -2x10 Bicep curls R/L -2x10 Triceps extension R/L -2x10 Anterior shoulder flexion R/L  Pt reporting fatigue following with increased time for rest break provided. Pt completed functional mobility back to his room close supervision using RW. Pt was left resting in recliner with call bell in reach, chair alarm on, and all needs met.    PM Session:  Pt received in room finishing PT session with direct handout from PT, Carly, to OT. Pt presenting to be in good spirits receptive to skilled OT session reporting 0/10 pain, however Pt reporting fatigue following full day of therapy- OT offering intermittent rest breaks, repositioning, and therapeutic support to optimize participation in therapy session. Focus this session energy conservation education and IADL retraining. Pt completed functional mobility to therapy gym using RW with CGA and increased time provided for energy conservation and to increase safety. Engaged Pt in conversation regarding home set-up, Pt IADL goals, fall prevention, and energy conservation. Pt reporting he completes his laundry using top loading washing machine and front loading drier and he transports his laundry from laundry room to bedroom ~10 ft without using a RW. Educated Pt on fall prevention and energy conservation techniques to utilize when completing higher level IADLs with Pt receptive to education. Engaged Pt in simulated laundry tasks using towels/wash cloths  and weighted objects to simulate  transporting wet clothing. Pt able to complete laundry with task with close supervision and increased time. Pt transported laundry basket with weighted items ~5ft x2 trials with increased time and rest breaks provided between trials with no LOB present- Pt presenting with shuffling gait, small step length, and decreased step height increasing Pt's risk for falling. Educated Pt on importance of having supervision from caregivers when completing laundry tasks to increase safety/verbalize understanding with pt receptive to education. Education provided on using reacher to retrieve items from washer and RW bag to transport items with Pt receptive to idea reporting he used one prior to recent CVA. Pt completed functional mobility back to his room using RW CGA. Pt was left resting in recliner with call bell in reach, chair alarm on, and all needs met.    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

## 2022-12-03 NOTE — Progress Notes (Signed)
PROGRESS NOTE   Subjective/Complaints:  No issues overnite , discussed D/C date THurs 9/5  ROS- neg for CP, SOB, N/V/D/C, denies swallowing issues   Objective:   No results found. Recent Labs    12/02/22 0647  WBC 5.8  HGB 9.7*  HCT 32.2*  PLT 439*    Recent Labs    12/02/22 0647  NA 136  K 4.2  CL 95*  CO2 31  GLUCOSE 111*  BUN 31*  CREATININE 1.69*  CALCIUM 8.5*     Intake/Output Summary (Last 24 hours) at 12/03/2022 0857 Last data filed at 12/03/2022 0800 Gross per 24 hour  Intake 1047 ml  Output --  Net 1047 ml        Physical Exam: Vital Signs Blood pressure 132/77, pulse 93, temperature 98.4 F (36.9 C), temperature source Oral, resp. rate 19, height 5\' 11"  (1.803 m), weight 109.8 kg, SpO2 95%.   General: No acute distress, laying in bed Mood and affect are appropriate Heart: Regular rate and rhythm no rubs murmurs or extra sounds Lungs: Clear to auscultation, breathing unlabored, no rales or wheezes Abdomen: Positive bowel sounds, soft nontender to palpation, nondistended, +ventral hernia (chronic per pt) Extremities: No clubbing, cyanosis, 1+ pretibial edema, mepilex on LLE Skin: No evidence of breakdown, no evidence of rash, pretibial stasis dermatitis with hyperpigmentation, mepilex on LLE area  PRIOR EXAM: Neurologic: Cranial nerves II through XII intact, motor strength is 3-/5 in left and 4/5 right  deltoid, bicep, tricep, grip, hip flexor, knee extensors, ankle dorsiflexor and plantar flexor Sensory exam normal sensation to light touch  in bilateral upper and lower extremities, although some word finding issues limit exam  Pt unable to name body parts accurately  Cerebellar exam normal finger to nose to finger as well as heel to shin in bilateral upper and lower extremities Musculoskeletal: Full range of motion in all 4 extremities. No joint swelling   Assessment/Plan: 1. Functional  deficits which require 3+ hours per day of interdisciplinary therapy in a comprehensive inpatient rehab setting. Physiatrist is providing close team supervision and 24 hour management of active medical problems listed below. Physiatrist and rehab team continue to assess barriers to discharge/monitor patient progress toward functional and medical goals  Care Tool:  Bathing    Body parts bathed by patient: Right arm, Left arm, Chest, Abdomen, Front perineal area, Buttocks, Right upper leg, Left upper leg, Right lower leg, Left lower leg, Face         Bathing assist Assist Level: Contact Guard/Touching assist     Upper Body Dressing/Undressing Upper body dressing   What is the patient wearing?: Pull over shirt    Upper body assist Assist Level: Contact Guard/Touching assist    Lower Body Dressing/Undressing Lower body dressing      What is the patient wearing?: Pants, Underwear/pull up     Lower body assist Assist for lower body dressing: Contact Guard/Touching assist     Toileting Toileting    Toileting assist Assist for toileting: Supervision/Verbal cueing     Transfers Chair/bed transfer  Transfers assist     Chair/bed transfer assist level: Contact Guard/Touching assist Chair/bed transfer assistive device: Dan Humphreys  Locomotion Ambulation   Ambulation assist      Assist level: Contact Guard/Touching assist Assistive device: Walker-rolling Max distance: 200   Walk 10 feet activity   Assist     Assist level: Contact Guard/Touching assist Assistive device: Walker-rolling   Walk 50 feet activity   Assist    Assist level: Contact Guard/Touching assist Assistive device: Walker-rolling    Walk 150 feet activity   Assist Walk 150 feet activity did not occur: Safety/medical concerns  Assist level: Contact Guard/Touching assist Assistive device: Walker-rolling    Walk 10 feet on uneven surface  activity   Assist Walk 10 feet on uneven  surfaces activity did not occur: Safety/medical concerns (fatigue)         Wheelchair     Assist Is the patient using a wheelchair?: Yes Type of Wheelchair: Manual (for transport)    Wheelchair assist level: Dependent - Patient 0%      Wheelchair 50 feet with 2 turns activity    Assist        Assist Level: Dependent - Patient 0%   Wheelchair 150 feet activity     Assist      Assist Level: Dependent - Patient 0%   Blood pressure 132/77, pulse 93, temperature 98.4 F (36.9 C), temperature source Oral, resp. rate 19, height 5\' 11"  (1.803 m), weight 109.8 kg, SpO2 95%.  Medical Problem List and Plan: 1. 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 Pre existing)              -patient may shower             -ELOS/Goals: Supervision with PT and OT, expect d/c on 9/5             -Continue CIR 2.  Antithrombotics: -DVT/anticoagulation:  Pharmaceutical: Eliquis 2.5mg  BID             -antiplatelet therapy: N/A 3. Pain Management: Neurontin 100 mg twice daily, oxycodone as needed             -Increase Neurontin to 200mg  BID 4. Mood/Behavior/Sleep: Zoloft 50 mg daily, Lamictal 25 mg nightly for now; BuSpar 7.5 mg twice daily             -antipsychotic agents: N/A 5. Neuropsych/cognition: This patient is capable of making decisions on his own behalf. 6. Skin/Wound Care: Routine skin checks 7. Fluids/Electrolytes/Nutrition: Routine in and outs with follow-up chemistries 8.  Paroxysmal atrial fibrillation.  Continue Eliquis 2.5 mg twice daily.  Cardiac rate controlled 9.  GE junction carcinoma stage IV.  Chemotherapy per Dr.Yu.  Chemotherapy currently on hold follow-up outpatient 10.  Chronic combined systolic and diastolic congestive heart failure.   Continue Demadex 40 mg daily as needed (getting 20mg  daily as of 11/29/22).  Monitor for any signs of fluid overload.  Daily weights  -8/31-9/1 wt stable, edema at baseline per pt/a little better, monitor Filed Weights   12/01/22 0500 12/02/22 0358 12/03/22 0500  Weight: 108.8 kg 108 kg 109.8 kg    11.  Diabetes mellitus.  Latest hemoglobin A1c 7.0.  Currently on SSI.  Patient on Farxiga 10 mg daily prior to admission.  Resumed here  9/2 CBGs looking good, cont regimen CBG (last 3)  Recent Labs    12/02/22 1639  12/02/22 2105 12/03/22 0557  GLUCAP 121* 138* 159*    12.  Obesity.  BMI 35.11.  Dietary follow-up 13.  CKD stage IIIb.  Cr 1.43 on 8/29, near baseline; monitor on routine labs    Latest Ref Rng & Units 12/02/2022    6:47 AM 11/28/2022    4:31 AM 11/27/2022    4:47 AM  BMP  Glucose 70 - 99 mg/dL 161  096  045   BUN 6 - 20 mg/dL 31  26  31    Creatinine 0.61 - 1.24 mg/dL 4.09  8.11  9.14   Sodium 135 - 145 mmol/L 136  137  138   Potassium 3.5 - 5.1 mmol/L 4.2  4.1  4.2   Chloride 98 - 111 mmol/L 95  101  102   CO2 22 - 32 mmol/L 31  25  25    Calcium 8.9 - 10.3 mg/dL 8.5  8.5  8.4   Intakes ~500-855mL per day creat creeping up will reduce demadex to 10mg  - recheck in am   14.  Hyperlipidemia.  Lipitor 40mg  daily 15.  Hypomagnesemia.  1.9 on 11/28/22, monitor PRN 16.  Chronic anemia.  Hgb 9.5 11/28/22, stable, monitor routinely Hemoglobin & Hematocrit - check stool OB , stable vs prior value     Component Value Date/Time   HGB 9.7 (L) 12/02/2022 0647   HGB 9.2 (L) 11/12/2022 0850   HGB 11.6 (L) 05/23/2022 1606   HCT 32.2 (L) 12/02/2022 0647   HCT 35.8 (L) 05/23/2022 1606    17. Obesity. Dietary counseling: Body mass index is 33.76 kg/m. 18.  Essential hypertension.   -elevated this am , appears to be outlier will cont to monitor on current meds  9/2 Vitals:   11/29/22 1339 11/29/22 1936 11/30/22 0507 11/30/22 1346  BP: (!) 135/91 (!) 136/95 (!) 143/83 139/63    11/30/22 1942 12/01/22 0442 12/01/22 1332 12/01/22 2007  BP: (!) 142/80 132/87 123/73 (!) 141/75   12/02/22 0358 12/02/22 1323 12/02/22 2013 12/03/22 0556  BP: (!) 164/86 (!) 154/81 (!) 154/94 132/77    Improved this am , monitor on current regimen  LOS: 6 days A FACE TO FACE EVALUATION WAS PERFORMED  Erick Colace 12/03/2022, 8:57 AM

## 2022-12-03 NOTE — Progress Notes (Signed)
Physical Therapy Session Note  Patient Details  Name: Jeremiah Vance MRN: [id number] Date of Birth: 25-May-1970  Today's Date: 12/03/2022 PT Individual Time: 8657-8469 and 6295-2841 PT Individual Time Calculation (min): 48 min and 57 min   Short Term Goals: Week 1:  PT Short Term Goal 1 (Week 1): = to LTGs based on ELOS  Skilled Therapeutic Interventions/Progress Updates:    Session 1: Pt received sitting in recliner and agreeable to therapy session. Sit>stands using RW with close SBA for safety during session. Gait training ~116ft towards main therapy gym using RW with close SBA for safety - demos slow gait speed but reciprocal stepping pattern - pt requires verbal cuing for navigation due to visual impairments.   Stair navigation training in stairwell ascending/descending 11 steps using R HR only to simulate home set--up via side step technique to allow BUE support on the HR - CGA for steadying/safety - ascending leading with L LE and descending leading with R LE.   Gait training ~119ft to ADL apartment using RW with close SBA for safety as described above.  Gait training in ADL apartment, no AD, to simulate gait in home environment (pt furniture walks in downstairs portion of home due to pt reports of RW not fitting) - CGA for safety but no moments of LOB.   Dynamic balance/dynamic gait task in kitchen, no AD, while collecting plates, bowls, and silverware from cabinets and then putting them in dishwasher including reaching and turning - pt with good safety awareness and using LUE to grasp and pick-up objects for NMR with improvement in coordination. No LOB throughout.  Discussed pt's goals of improving balance and endurance to return to PLOF to participate in home tasks such as loading/unloading dishwasher and pt reports feeling much more confident in his ability to perform these tasks since initial eval.    Gait training ~384ft using RW to outside including navigating on/off elevators with  CGA for safety. Gait training outside using RW on paved sidewalk and brick walkways using RW including navigating up/down inclines on a slant for balance challenge with CGA for steadying and a few instances of light min A with unlevel surfaces - pt reports fatigue with uphill gait training. Therapist educating and cuing for safety awareness due to visual impairment including navigating on/off sidewalk curb cutouts.  Gait training using RW ~269ft back to his room as described above; however, transitioned to gait training final ~16ft without UE support focusing on increased balance challenge with pt having slower gait speed, more guarded upper body posturing, and mild increased balance instability.   Educated pt on BE FAST signs/symptoms of CVA.  Pt left seated in recliner in care of OT at end of session.   Session 2: Pt received sitting in recliner and agreeable to therapy session. Sit>stand recliner>RW with close SBA. Gait training ~155ft to main therapy gym, using RW, with CGA/close SBA for safety - continues to have shorter steps with slow gait speed, but reciprocal pattern - slight L knee instability during stance biasing towards hyperextension.  Dynamic gait training using RW through obstacle course including weaving through cones and stepping over small obstacles using RW with focus on visual scanning, safety awareness, and dynamic balance with this task   Dynamic gait training ~233ft, no UE support, with focus on head turning (horizontal and vertical) to challenge balance via use of proprioception while stimulating vestibular and visual systems - attempted to have pt perform head rotations while keeping eyes forward but pt unable to  coordinate the movements between his head and eyes. Requires consistent min A for balance. Pt with increased challenge with horizontal head turns compared to vertical.  Dynamic stepping balance task of side stepping alternating R/L while holding 3.3lb weighted ball  with arms extended at chest height and rotating the ball, trunk, and head in direction stepped for activation of vestibular system, visual system, and providing perturbation via use of the weight.  Dynamic gait training using agility ladder of stepping pattern (2 steps forward and 1 step back) with pt reporting onset of "whooziness" during this task rated as 5-6/10  - requires heavier min/mod A during this task when whooziness starts with pt having strong R lateral lean and once he started to feel imbalance he had increased difficulty regaining balance due to onset of fear of falling.  Gait training ~160ft back to his room, no UE support, with min A for balance and pt demos short step length bilaterally with increased trunk sway, slow gait speed, and UEs in abducted/guarded posturing - pt reports he walks slow without AD due to visual impairments and being unsure of the environment in addition to balance instability.   Pt left seated in recliner with needs in reach, chair alarm on, and meal tray set-up.   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  Session 2: Denies pain during session.    Therapy/Group: Individual Therapy  Ginny Forth , PT, DPT, NCS, CSRS 12/03/2022, 12:55 PM

## 2022-12-03 NOTE — Progress Notes (Signed)
Hypoglycemic Event  CBG: 57  Treatment: 1 tube glucose gel  Symptoms: None  Follow-up CBG: Time:1815  CBG Result:102   Possible Reasons for Event: Unknown  Comments/MD notified:yes Wendi Maya, P.A.    Mamie Nick

## 2022-12-04 ENCOUNTER — Ambulatory Visit: Payer: Medicaid Other

## 2022-12-04 ENCOUNTER — Other Ambulatory Visit: Payer: Medicaid Other

## 2022-12-04 ENCOUNTER — Ambulatory Visit: Payer: Medicaid Other | Admitting: Oncology

## 2022-12-04 ENCOUNTER — Ambulatory Visit: Payer: Medicaid Other | Admitting: Physical Therapy

## 2022-12-04 DIAGNOSIS — I639 Cerebral infarction, unspecified: Secondary | ICD-10-CM | POA: Diagnosis not present

## 2022-12-04 LAB — BASIC METABOLIC PANEL
Anion gap: 14 (ref 5–15)
BUN: 26 mg/dL — ABNORMAL HIGH (ref 6–20)
CO2: 25 mmol/L (ref 22–32)
Calcium: 8.5 mg/dL — ABNORMAL LOW (ref 8.9–10.3)
Chloride: 99 mmol/L (ref 98–111)
Creatinine, Ser: 1.63 mg/dL — ABNORMAL HIGH (ref 0.61–1.24)
GFR, Estimated: 50 mL/min — ABNORMAL LOW (ref >60)
Glucose, Bld: 99 mg/dL (ref 70–99)
Potassium: 4.2 mmol/L (ref 3.5–5.1)
Sodium: 138 mmol/L (ref 135–145)

## 2022-12-04 LAB — GLUCOSE, CAPILLARY
Glucose-Capillary: 98 mg/dL (ref 70–99)
Glucose-Capillary: 114 mg/dL — ABNORMAL HIGH (ref 70–99)
Glucose-Capillary: 109 mg/dL — ABNORMAL HIGH (ref 70–99)
Glucose-Capillary: 115 mg/dL — ABNORMAL HIGH (ref 70–99)

## 2022-12-04 NOTE — Progress Notes (Signed)
PROGRESS NOTE   Subjective/Complaints:  No issues overnite   ROS- neg for CP, SOB, N/V/D/C, denies swallowing issues   Objective:   No results found. Recent Labs    12/02/22 0647  WBC 5.8  HGB 9.7*  HCT 32.2*  PLT 439*    Recent Labs    12/02/22 0647 12/04/22 0709  NA 136 138  K 4.2 4.2  CL 95* 99  CO2 31 25  GLUCOSE 111* 99  BUN 31* 26*  CREATININE 1.69* 1.63*  CALCIUM 8.5* 8.5*     Intake/Output Summary (Last 24 hours) at 12/04/2022 0833 Last data filed at 12/04/2022 0729 Gross per 24 hour  Intake 824 ml  Output --  Net 824 ml        Physical Exam: Vital Signs Blood pressure (!) 140/93, pulse 80, temperature 97.6 F (36.4 C), temperature source Oral, resp. rate 18, height 5\' 11"  (1.803 m), weight 110.5 kg, SpO2 97%.   General: No acute distress, laying in bed Mood and affect are appropriate Heart: Regular rate and rhythm no rubs murmurs or extra sounds Lungs: Clear to auscultation, breathing unlabored, no rales or wheezes Abdomen: Positive bowel sounds, soft nontender to palpation, nondistended, +ventral hernia (chronic per pt) Extremities: No clubbing, cyanosis, 1+ pretibial edema, mepilex on LLE Skin: No evidence of breakdown, no evidence of rash, pretibial stasis dermatitis with hyperpigmentation, mepilex on LLE area  PRIOR EXAM: Neurologic: Cranial nerves II through XII intact, motor strength is 3-/5 in left and 4/5 right  deltoid, bicep, tricep, grip, hip flexor, knee extensors, ankle dorsiflexor and plantar flexor Sensory exam normal sensation to light touch  in bilateral upper and lower extremities, although some word finding issues limit exam Cerebellar exam normal finger to nose to finger as well as heel to shin in bilateral upper and lower extremities Musculoskeletal: Full range of motion in all 4 extremities. No joint swelling   Assessment/Plan: 1. Functional deficits which require 3+  hours per day of interdisciplinary therapy in a comprehensive inpatient rehab setting. Physiatrist is providing close team supervision and 24 hour management of active medical problems listed below. Physiatrist and rehab team continue to assess barriers to discharge/monitor patient progress toward functional and medical goals  Care Tool:  Bathing    Body parts bathed by patient: Right arm, Left arm, Chest, Abdomen, Front perineal area, Buttocks, Right upper leg, Left upper leg, Right lower leg, Left lower leg, Face         Bathing assist Assist Level: Contact Guard/Touching assist     Upper Body Dressing/Undressing Upper body dressing   What is the patient wearing?: Pull over shirt    Upper body assist Assist Level: Contact Guard/Touching assist    Lower Body Dressing/Undressing Lower body dressing      What is the patient wearing?: Pants, Underwear/pull up     Lower body assist Assist for lower body dressing: Contact Guard/Touching assist     Toileting Toileting    Toileting assist Assist for toileting: Supervision/Verbal cueing     Transfers Chair/bed transfer  Transfers assist     Chair/bed transfer assist level: Supervision/Verbal cueing Chair/bed transfer assistive device: Geologist, engineering  Ambulation assist      Assist level: Supervision/Verbal cueing Assistive device: Walker-rolling Max distance: 232ft   Walk 10 feet activity   Assist     Assist level: Supervision/Verbal cueing Assistive device: Walker-rolling   Walk 50 feet activity   Assist    Assist level: Supervision/Verbal cueing Assistive device: Walker-rolling    Walk 150 feet activity   Assist Walk 150 feet activity did not occur: Safety/medical concerns  Assist level: Supervision/Verbal cueing Assistive device: Walker-rolling    Walk 10 feet on uneven surface  activity   Assist Walk 10 feet on uneven surfaces activity did not occur: Safety/medical  concerns (fatigue)   Assist level: Contact Guard/Touching assist Assistive device: Walker-rolling   Wheelchair     Assist Is the patient using a wheelchair?: No Type of Wheelchair: Manual (for transport)    Wheelchair assist level: Dependent - Patient 0%      Wheelchair 50 feet with 2 turns activity    Assist        Assist Level: Dependent - Patient 0%   Wheelchair 150 feet activity     Assist      Assist Level: Dependent - Patient 0%   Blood pressure (!) 140/93, pulse 80, temperature 97.6 F (36.4 C), temperature source Oral, resp. rate 18, height 5\' 11"  (1.803 m), weight 110.5 kg, SpO2 97%.  Medical Problem List and Plan: 1. 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 Pre existing)              -patient may shower             -ELOS/Goals: Supervision with PT and OT, Team conference today please see physician documentation under team conference tab, met with team  to discuss problems,progress, and goals. Formulized individual treatment plan based on medical history, underlying problem and comorbidities.              -Continue CIR 2.  Antithrombotics: -DVT/anticoagulation:  Pharmaceutical: Eliquis 2.5mg  BID             -antiplatelet therapy: N/A 3. Pain Management: Neurontin 100 mg twice daily, oxycodone as needed             -Increase Neurontin to 200mg  BID 4. Mood/Behavior/Sleep: Zoloft 50 mg daily, Lamictal 25 mg nightly for now; BuSpar 7.5 mg twice daily             -antipsychotic agents: N/A 5. Neuropsych/cognition: This patient is capable of making decisions on his own behalf. 6. Skin/Wound Care: Routine skin checks 7. Fluids/Electrolytes/Nutrition: Routine in and outs with follow-up chemistries 8.  Paroxysmal atrial  fibrillation.  Continue Eliquis 2.5 mg twice daily.  Cardiac rate controlled 9.  GE junction carcinoma stage IV.  Chemotherapy per Dr.Yu.  Chemotherapy currently on hold follow-up outpatient 10.  Chronic combined systolic and diastolic congestive heart failure.  Continue Demadex 40 mg daily as needed (getting 20mg  daily as of 11/29/22).  Monitor for any signs of fluid overload.  Daily weights  -8/31-9/1 wt stable, edema at baseline per pt/a little better, monitor Filed Weights   12/02/22 0358 12/03/22 0500 12/04/22 0500  Weight: 108 kg 109.8 kg 110.5 kg    11.  Diabetes mellitus.  Latest hemoglobin A1c 7.0.  Currently on SSI.  Patient on Farxiga 10  mg daily prior to admission.  Resumed here  9/2 CBGs looking good, cont regimen CBG (last 3)  Recent Labs    12/03/22 1818 12/03/22 2121 12/04/22 0618  GLUCAP 102* 134* 98    12.  Obesity.  BMI 35.11.  Dietary follow-up 13.  CKD stage IIIb.  Cr 1.43 on 8/29, near baseline; monitor on routine labs    Latest Ref Rng & Units 12/04/2022    7:09 AM 12/02/2022    6:47 AM 11/28/2022    4:31 AM  BMP  Glucose 70 - 99 mg/dL 99  244  010   BUN 6 - 20 mg/dL 26  31  26    Creatinine 0.61 - 1.24 mg/dL 2.72  5.36  6.44   Sodium 135 - 145 mmol/L 138  136  137   Potassium 3.5 - 5.1 mmol/L 4.2  4.2  4.1   Chloride 98 - 111 mmol/L 99  95  101   CO2 22 - 32 mmol/L 25  31  25    Calcium 8.9 - 10.3 mg/dL 8.5  8.5  8.5   Intakes ~500-850mL per day creat creeping up will reduce demadex to 10mg  - recheck in am   14.  Hyperlipidemia.  Lipitor 40mg  daily 15.  Hypomagnesemia.  1.9 on 11/28/22, monitor PRN 16.  Chronic anemia.  Hgb 9.5 11/28/22, stable, monitor routinely Hemoglobin & Hematocrit - check stool OB , stable vs prior value     Component Value Date/Time   HGB 9.7 (L) 12/02/2022 0647   HGB 9.2 (L) 11/12/2022 0850   HGB 11.6 (L) 05/23/2022 1606   HCT 32.2 (L) 12/02/2022 0647   HCT 35.8 (L) 05/23/2022 1606    17. Obesity. Dietary counseling: Body mass  index is 33.76 kg/m. 18.  Essential hypertension.   -fair control cont to monitor on current meds  9/4 Vitals:   11/30/22 1346 11/30/22 1942 12/01/22 0442 12/01/22 1332  BP: 139/63 (!) 142/80 132/87 123/73   12/01/22 2007 12/02/22 0358 12/02/22 1323 12/02/22 2013  BP: (!) 141/75 (!) 164/86 (!) 154/81 (!) 154/94   12/03/22 0556 12/03/22 1542 12/03/22 1958 12/04/22 0513  BP: 132/77 133/81 (!) 153/79 (!) 140/93   LOS: 7 days A FACE TO FACE EVALUATION WAS PERFORMED  Jeremiah Vance 12/04/2022, 8:33 AM

## 2022-12-04 NOTE — Progress Notes (Signed)
Patient ID: Jeremiah Vance, male   DOB: 12/06/1970, 52 y.o.   MRN: [id number]  Team Conference Report to Patient/Family  Team Conference discussion was reviewed with the patient and caregiver, including goals, any changes in plan of care and target discharge date.  Patient and caregiver express understanding and are in agreement.  The patient has a target discharge date of 12/07/22.  SW met with patient and called sister, [name] at bedside. Sw left VM with sister confirming d/c on Saturday. No additional questions or concerns.   Andria Rhein 12/04/2022, 2:24 PM

## 2022-12-04 NOTE — Progress Notes (Signed)
Physical Therapy Session Note  Patient Details  Name: Jeremiah Vance MRN: [id number] Date of Birth: October 12, 1970  Today's Date: 12/04/2022 PT Individual Time: 1105-1203 and 1536- 1648 PT Individual Time Calculation (min): 58 min and 72 minutes  Short Term Goals: Week 1:  PT Short Term Goal 1 (Week 1): = to LTGs based on ELOS  Skilled Therapeutic Interventions/Progress Updates:     Session 1: pt agreeable to therapy session -- received up in recliner w/ alarm activated  Gait training using RW and supervision from room to bench outside within 7 minutes. Pt took 1 minute break on bench before continuing ambulation for 7 minutes using RW and CGA d/t brick surface. RW removed during ambulation and continued w/  L HHA for 4 minutes. 2 minute rest break on bench outside. Ambulation continued for 4 minutes, w/ supervision from bench to 4th floor gym no UE support. No LOB throughout ambulation. Pt demo increased step length and gait velocity, as well as reduced step width and guarded BUE. Pt continues to ambulate w/ high guard, decreased velocity, and difficulty navigating obstacles impacted by his baseline visual deficit.  Pt required to navigate elevators, staff/visitors, and different floor surfaces such as brick, concrete, and tile. Pt would benefit from gait training and practice using a rollator given he will be using one intermittently at home after d/c  Dynamic balance intervention: pt reaching down for weighted objects from 6 inch box on the L side & bringing them diagonally up across the body. Task progressed from light weighted med balls to 5 lb & 6lb dumb bells. Rest break given after 10 repetitions. Task progressed to reaching down for dumb bells then side stepping over walking pole prior to bringing weight diagonally up across the body x5 repetitions. Rest break prior to repeating above w/ 6 inch box on the R side. --- CGA throughout task. Demo good ankle & hip reactionary strategies. Increased  difficulty reaching to the L > R as well as stepping to the L > R. Increased fatigue at end of task resulting in pt turning whole body to face box rather than using trunk rotation to grab the weight.  Gait training at end of session w/ no AD, CGA from 4th floor gym to room(~250 ft). No LOB. Continuing above gait mechanics as stated above.  Pt left up in recliner with needs met, chair alarm on, and call bell in reach.  Session 2: pt agreeable to therapy session -- received up in recliner w/ alarm NOT activated. PT notified nurse tech of alarm being deactivated. SPT educated pt to call nursing with his call bell when he needs to get up, given pt reported getting up to use the restroom independently while alarm was off.  Gait training: pt room > gym w/ RW, supervision. Pt ambulating w/ decreased velocity, decreased step length, and increased UE support.   Time spent for BP measurement sitting d/t NT taking it twice in standing and it being elevated both times. Pt sitting on edge of mat, cuff on LUE, BP 157/90 , 59 BPM. Notified NT.  Gait training: pt from gym > stairwell, using RW, supervision. Same gait deviations as above  Stair training: pt navigating x11 stairs w/ step to pattern, B UE support on R hand rail both ways. Repeat x11 stairs . Supervision throughout task, increased velocity and no LOB.  Gait training: pt from stairwell > gym using RW, supervision. Same gait deviations as above.  Gait training & dynamic balance: pt  ambulating > 300 ft w/o AD to receive rollator in hall closet. Pt demo slight R lateral lean, decreased velocity, high guard UE, and decreased step length. Pt then ambulated w/ rollator from closet to ortho gym > 200 ft. pt navigated up and down ramp w/ rollator. Pt ambulated from ortho gym to main gym w/ rollator ~200 ft. Full task completed w/ standing rest break to adjust rollator handles and demo no LOB. Gait deviations reverted back to same seen w/ RW.  Dynamic balance  training: Pt split stance on 4 inch block w/ R foot forward reaching to the left for squiqz(x5) on tray table at waist height and placing onto mirror infront of pt 2x5 ; repeat w/ L foot forward and reaching to the right 2x5. Moderate use of verbal and tactile cues for midline orientation. MinA throughout for stability. Task adjusted for midline orientation, 1x5 each side w/ verbal cues to self correct midline orientation with the use of visual feedback in the mirror. Pt demo improvements and self corrections w/ minimal verbal cues. MinA - CGA for stability.  Gait training: Pt ambulated from Gym > room using RW, supervision. Decreased velocity and step length d/t increased L knee pain.   Pt left up in recliner with needs met, chair alarm on, and call bell in reach. NT present.  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   Session 2: 5/10 pain in L knee at end of 2nd session, nursing informed about pt requesting medication.   Therapy/Group: Individual Therapy  Gilman Buttner 12/04/2022, 12:28 PM

## 2022-12-04 NOTE — Progress Notes (Signed)
Occupational Therapy Session Note  Patient Details  Name: Jeremiah Vance MRN: [id number] Date of Birth: 09-Apr-1970  Today's Date: 12/04/2022 OT Individual Time: 0803-0900 OT Individual Time Calculation (min): 57 min    Short Term Goals: Week 1:  OT Short Term Goal 1 (Week 1): STG=LTG d/t ELOS  Skilled Therapeutic Interventions/Progress Updates:     Pt received semi-reclined in bed presenting to be in good spirits receptive to skilled OT session reporting 5/10 pain in bilateral knees- OT offering intermittent rest breaks, repositioning, and therapeutic support to optimize participation in therapy session. Pt requesting pain medications- Rn notified. Pt requesting to take shower this AM. Focus this session BADL retraining with emphasis on energy conservation and safety awareness. Pt transitioned to EOB supervision and completed functional mobility in his room using RW CGA to gather his clothing from his drawers with close supervision for safety. Pt required min verbal cues for safety when transporting clothing items to bathroom as Pt attempted to hold clothing in his hand while also grasping RW. Re-educated Pt on option of using RW bag with Pt receptive to education. Pt required min questioning cues to recall need to sit when doffing clothing to increase safety. Pt completed side steps into shower using grab bar with CGA. Once in shower, Pt able to complete U/LB bathing with close supervision using grab bars for balance when standing to wash bottom. Following shower, Pt completed UB dressing seated in wc with assistance required to correctly orient shirt. Donned pants in seated position and stood to bring pants to waist with CGA provided for standing balance. Pt with improved use of energy conservation techniques during session- providing self increased time, increased rest breaks, and sitting for BADLs to conserve energy. Pt completed functional mobility to sink using RW and stood with close supervision  while completing oral hygiene and grooming tasks. Pt requesting to stay n room for remainder of therapy session. Engaged Pt in completing UB exercises using red therband with skilled education and feedback provided on technique/muscle engagement- OT provided modifications PRN to prevent shoulder pain. Pt completed 1x10 reps of the following exercises while seated EOB: -Chest pulls (scapular retraction) -Bicep curls -Tricep extension -Therapist anchored B UE rows -PRN diagonal pulls Pt reporting no pain following UB therex. Pt was left resting in recliner with call bell in reach, seat belt alarm on, and all needs met.    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

## 2022-12-04 NOTE — Progress Notes (Signed)
Patient ID: Jeremiah Vance, male   DOB: April 10, 1970, 52 y.o.   MRN: [id number]  Bedside Commode ordered through adapt.

## 2022-12-04 NOTE — Patient Care Conference (Signed)
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 Claudette Laws Social Worker Present: Lavera Guise, BSW Nurse Present: Chana Bode, RN PT Present: Casimiro Needle, PT OT Present: Bonnell Public, OT SLP Present: Everardo Pacific, SLP PPS Coordinator present : Edson Snowball, PT     Current Status/Progress Goal Weekly Team Focus  Bowel/Bladder   Continent of B/B. LBM 12/03/22   Maintain Continence.   Assist with BR privileges.    Swallow/Nutrition/ Hydration               ADL's   UB bathing/dresing supervision, LB bathing/dressing close superivsion, ambulatory transfers to TTB/toilet close supervision using RW, grooming/hygine standing at sink mod I, toileting distant supervision using RW   mod I-supervision   Pt education, d/c planning, AD education, functional transfer training, compensatory visual scanning education, activity tolerance, U/LB strengthening, balance training    Mobility   mod-I bed mobility, supervision sit<>stands and stand pivots using RW, supervision/CGA gait >262ft using RW, CGA staircase using R HR only to simulate home  set-up   supervision overall at ambulatory level  endurance, dynamic standing balance, L hemi NMR, dynamic gait training, stair navigation training -- barriers: limited family support    Communication                Safety/Cognition/ Behavioral Observations               Pain   Denies Pain   Remain pain free   Assess Q4 and prn.    Skin   Lt Pretibial Scab wound   Prevent infection, maintain integrity  Asses QS and prn.      Discharge Planning:  Discharging home with sister. Patient medically stable, sister out of town due to work conference until Saturday.   Team Discussion: Patient with bilateral knee pain post frontal lobe CVA.  Patient on target to meet rehab goals: yes, currently needs supervision for upper body care and CGA for lower body bathing and dressing. Able to ambulate to/from toilet using a RW and complete tranfers with GA - supervision. Able to ambulate >200' using a RW with CGA and compete steps with CGA - supervision. Should be able to manage safely in home environment. Goals set for supervision - mod I overall.  *See Care Plan and progress notes for long and short-term goals.   Revisions to Treatment Plan:  N/a   Teaching Needs: Safety, medications, transfers, toileting, etc.   Current Barriers to Discharge: Decreased caregiver support  Possible Resolutions to Barriers: Family education completed 11/30/22 HH follow up services     Medical Summary Current Status: BP with occ lability, BIlateral knee pain from  OA  Barriers to Discharge: Medical stability;Uncontrolled Pain   Possible Resolutions to [employer]: trial of voltaren gel for knees, avoid oral NSAIDs , may need further adjustment of BP meds   Continued Need for Acute Rehabilitation Level of Care: The patient requires daily medical management by a physician with specialized training in physical medicine and rehabilitation for the following reasons: Direction of a  multidisciplinary physical rehabilitation program to maximize functional independence : Yes Medical management of patient stability for increased activity during participation in an intensive rehabilitation regime.: Yes Analysis of laboratory values and/or radiology reports with any subsequent need for medication adjustment and/or medical intervention. : Yes   I attest that I was present, lead the team conference, and concur with the assessment and plan of the team.   Chana Bode B 12/04/2022, 8:17 PM

## 2022-12-05 ENCOUNTER — Inpatient Hospital Stay: Payer: Medicaid Other | Admitting: Hospice and Palliative Medicine

## 2022-12-05 ENCOUNTER — Encounter: Payer: Self-pay | Admitting: Oncology

## 2022-12-05 DIAGNOSIS — I639 Cerebral infarction, unspecified: Secondary | ICD-10-CM | POA: Diagnosis not present

## 2022-12-05 LAB — GLUCOSE, CAPILLARY
Glucose-Capillary: 96 mg/dL (ref 70–99)
Glucose-Capillary: 115 mg/dL — ABNORMAL HIGH (ref 70–99)
Glucose-Capillary: 111 mg/dL — ABNORMAL HIGH (ref 70–99)
Glucose-Capillary: 147 mg/dL — ABNORMAL HIGH (ref 70–99)

## 2022-12-05 MED ORDER — HYDRALAZINE HCL 25 MG PO TABS
25.0000 mg | ORAL_TABLET | Freq: Three times a day (TID) | ORAL | Status: DC
  Administered 2022-12-05 – 2022-12-07 (×6): 25 mg via ORAL
  Filled 2022-12-05 (×6): qty 1

## 2022-12-05 NOTE — Progress Notes (Signed)
Physical Therapy Session Note  Patient Details  Name: GRIER SOUDER MRN: [id number] Date of Birth: 12/27/1970  Today's Date: 12/05/2022 PT Individual Time: 1533-1650 PT Individual Time Calculation (min): 77 min   Short Term Goals: Week 1:  PT Short Term Goal 1 (Week 1): = to LTGs based on ELOS  Skilled Therapeutic Interventions/Progress Updates:     Pt agreeable to therapy session. Received up in chair w/ alarm activated. RN present for meds.  Pt voided at beginning of session in toilet. Moderate amount. Pt continent to bladder. Pt supervision for toileting transfer & hygiene -- use of RW for transfer. Pt standing for hygiene.  Time took during session for retesting of strength, sensation, and caretool items. - STS throughout session : supervision - Car transfer: supervision, RW - Ramp: supervision, RW - Ambulation 150 ft: supervision, RW - Stairs: 11, B hand hold assist R side both directions  Gait training: room > ortho gym > stairwell w/ RW, supervision, navigating workers, doorways, opening heavy stairwell door w/ assistance holding it open to manage RW through the door. As well as at end of session from main gym > room. Pt experienced one loss of balance when transitioning from RW to no AD before sitting in recliner, but he was able to recover w/ steppage strategies using L foot forward. Pt demo improvements in step length and ankle/hip/steppage strategies. Pt continues to ambulate w/ decreased velocity d/t baseline vision deficits.  Dynamic balance: pt ambulating w/ no UE support, high guard, weaving in/out around 7 cones x4 lengths ; progress to holding weighted red ball x2 lengths & then grey weighted ball x2 lengths -- CGA throughout, no LOB, good ankle and steppage strategies. Intermittently kicked cones d/t baseline visual deficits but able to maintain balance.  TUG: - 1st trial: use of RW 28 sec (pt went around cone) - 2nd trial: use of RW 22 sec - 3rd trial: no AD 21  sec - 4th trial(TUG motor): no AD, changing small grey weighted ball from 1 hand to another throughout task. 21.5 sec - 5th trial(TUG cog): no AD, saying the months of the year 21 sec  Educated: Pt educated on managing home environment & self safety while at home to prevent falls and how to manage after a fall occurs such as no throw rugs, always calling 911 if hitting his head, always having a phone or alert system on him, always call 911 first if something happens, staying calm, and checking for any change in pain/sxs throughout the body.  Pt left in recliner, alarm activated, NT present, all needs within reach.  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  Therapy/Group: Individual Therapy  Gilman Buttner 12/05/2022, 9:44 AM

## 2022-12-05 NOTE — Progress Notes (Addendum)
PROGRESS NOTE   Subjective/Complaints:  No issues overnite   ROS- neg for CP, SOB, N/V/D/C, denies swallowing issues   Objective:   No results found. No results for input(s): "WBC", "HGB", "HCT", "PLT" in the last 72 hours.   Recent Labs    12/04/22 0709  NA 138  K 4.2  CL 99  CO2 25  GLUCOSE 99  BUN 26*  CREATININE 1.63*  CALCIUM 8.5*     Intake/Output Summary (Last 24 hours) at 12/05/2022 0901 Last data filed at 12/05/2022 0807 Gross per 24 hour  Intake 831 ml  Output --  Net 831 ml        Physical Exam: Vital Signs Blood pressure 133/82, pulse 84, temperature 98.4 F (36.9 C), temperature source Oral, resp. rate 15, height 5\' 11"  (1.803 m), weight 108.4 kg, SpO2 94%.   General: No acute distress, laying in bed Mood and affect are appropriate Heart: Regular rate and rhythm no rubs murmurs or extra sounds Lungs: Clear to auscultation, breathing unlabored, no rales or wheezes Abdomen: Positive bowel sounds, soft nontender to palpation, nondistended, +ventral hernia (chronic per pt) Extremities: No clubbing, cyanosis, 1+ pretibial edema, mepilex on LLE Skin: No evidence of breakdown, no evidence of rash, pretibial stasis dermatitis with hyperpigmentation, mepilex on LLE area  PRIOR EXAM: Neurologic: Cranial nerves II through XII intact, motor strength is 3-/5 in left and 4/5 right  deltoid, bicep, tricep, grip, hip flexor, knee extensors, ankle dorsiflexor and plantar flexor Sensory exam normal sensation to light touch  in bilateral upper and lower extremities, although some word finding issues limit exam Cerebellar exam normal finger to nose to finger as well as heel to shin in bilateral upper and lower extremities Musculoskeletal: Full range of motion in all 4 extremities. No joint swelling   Assessment/Plan: 1. Functional deficits which require 3+ hours per day of interdisciplinary therapy in a  comprehensive inpatient rehab setting. Physiatrist is providing close team supervision and 24 hour management of active medical problems listed below. Physiatrist and rehab team continue to assess barriers to discharge/monitor patient progress toward functional and medical goals  Care Tool:  Bathing    Body parts bathed by patient: Right arm, Left arm, Chest, Abdomen, Front perineal area, Buttocks, Right upper leg, Left upper leg, Right lower leg, Left lower leg, Face         Bathing assist Assist Level: Contact Guard/Touching assist     Upper Body Dressing/Undressing Upper body dressing   What is the patient wearing?: Pull over shirt    Upper body assist Assist Level: Supervision/Verbal cueing    Lower Body Dressing/Undressing Lower body dressing      What is the patient wearing?: Pants, Underwear/pull up     Lower body assist Assist for lower body dressing: Supervision/Verbal cueing     Toileting Toileting    Toileting assist Assist for toileting: Supervision/Verbal cueing     Transfers Chair/bed transfer  Transfers assist     Chair/bed transfer assist level: Supervision/Verbal cueing Chair/bed transfer assistive device: Geologist, engineering   Ambulation assist      Assist level: Supervision/Verbal cueing Assistive device: Walker-rolling Max distance: 220ft  Walk 10 feet activity   Assist     Assist level: Supervision/Verbal cueing Assistive device: Walker-rolling   Walk 50 feet activity   Assist    Assist level: Supervision/Verbal cueing Assistive device: Walker-rolling    Walk 150 feet activity   Assist Walk 150 feet activity did not occur: Safety/medical concerns  Assist level: Supervision/Verbal cueing Assistive device: Walker-rolling    Walk 10 feet on uneven surface  activity   Assist Walk 10 feet on uneven surfaces activity did not occur: Safety/medical concerns (fatigue)   Assist level: Contact  Guard/Touching assist Assistive device: Walker-rolling   Wheelchair     Assist Is the patient using a wheelchair?: No Type of Wheelchair: Manual (for transport)    Wheelchair assist level: Dependent - Patient 0%      Wheelchair 50 feet with 2 turns activity    Assist        Assist Level: Dependent - Patient 0%   Wheelchair 150 feet activity     Assist      Assist Level: Dependent - Patient 0%   Blood pressure 133/82, pulse 84, temperature 98.4 F (36.9 C), temperature source Oral, resp. rate 15, height 5\' 11"  (1.803 m), weight 108.4 kg, SpO2 94%.  Medical Problem List and Plan: 1. 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 Pre existing)              -patient may shower             -ELOS/Goals: Supervision with PT and OT, 9/7 with family support               -Continue CIR 2.  Antithrombotics: -DVT/anticoagulation:  Pharmaceutical: Eliquis 2.5mg  BID             -antiplatelet therapy: N/A 3. Pain Management: Neurontin 100 mg twice daily, oxycodone as needed             -Increase Neurontin to 200mg  BID 4. Mood/Behavior/Sleep: Zoloft 50 mg daily, Lamictal 25 mg nightly for now; BuSpar 7.5 mg twice daily             -antipsychotic agents: N/A 5. Neuropsych/cognition: This patient is capable of making decisions on his own behalf. 6. Skin/Wound Care: Routine skin checks 7. Fluids/Electrolytes/Nutrition: Routine in and outs with follow-up chemistries 8.  Paroxysmal atrial fibrillation.  Continue Eliquis 2.5 mg twice daily.  Cardiac rate controlled 9.  GE junction carcinoma stage IV.  Chemotherapy per Dr.Yu.  Chemotherapy currently on hold follow-up outpatient 10.  Chronic combined systolic and diastolic congestive heart failure.  Continue  Demadex 40 mg daily as needed (getting 20mg  daily as of 11/29/22).  Monitor for any signs of fluid overload.  Daily weights  -8/31-9/1 wt stable, edema at baseline per pt/a little better, monitor Filed Weights   12/03/22 0500 12/04/22 0500 12/05/22 0611  Weight: 109.8 kg 110.5 kg 108.4 kg    11.  Diabetes mellitus.  Latest hemoglobin A1c 7.0.  Currently on SSI.  Patient on Farxiga 10 mg daily prior to admission.  Resumed here  9/2 CBGs looking good, cont regimen CBG (last 3)  Recent Labs    12/04/22 1649 12/04/22 2115 12/05/22 0612  GLUCAP 109* 115* 96    12.  Obesity.  BMI 35.11.  Dietary follow-up  13.  CKD stage IIIb.  Cr 1.43 on 8/29, near baseline; monitor on routine labs    Latest Ref Rng & Units 12/04/2022    7:09 AM 12/02/2022    6:47 AM 11/28/2022    4:31 AM  BMP  Glucose 70 - 99 mg/dL 99  536  644   BUN 6 - 20 mg/dL 26  31  26    Creatinine 0.61 - 1.24 mg/dL 0.34  7.42  5.95   Sodium 135 - 145 mmol/L 138  136  137   Potassium 3.5 - 5.1 mmol/L 4.2  4.2  4.1   Chloride 98 - 111 mmol/L 99  95  101   CO2 22 - 32 mmol/L 25  31  25    Calcium 8.9 - 10.3 mg/dL 8.5  8.5  8.5   Intakes ~500-889mL per day creat creeping up will reduce demadex to 10mg  - recheck in am   14.  Hyperlipidemia.  Lipitor 40mg  daily 15.  Hypomagnesemia.  1.9 on 11/28/22, monitor PRN 16.  Chronic anemia.  Hgb 9.5 11/28/22, stable, monitor routinely Hemoglobin & Hematocrit - check stool OB , stable vs prior value     Component Value Date/Time   HGB 9.7 (L) 12/02/2022 0647   HGB 9.2 (L) 11/12/2022 0850   HGB 11.6 (L) 05/23/2022 1606   HCT 32.2 (L) 12/02/2022 0647   HCT 35.8 (L) 05/23/2022 1606    17. Obesity. Dietary counseling: Body mass index is 33.76 kg/m. 18.  Essential hypertension.   BP creeping up , states he used to take BP meds at home  Vitals:   12/01/22 1332 12/01/22 2007 12/02/22 0358 12/02/22 1323  BP: 123/73 (!) 141/75 (!) 164/86 (!) 154/81   12/02/22 2013 12/03/22 0556 12/03/22 1542  12/03/22 1958  BP: (!) 154/94 132/77 133/81 (!) 153/79   12/04/22 0513 12/04/22 1543 12/04/22 1957 12/05/22 0604  BP: (!) 140/93 (!) 163/97 (!) 168/89 133/82  D/C summary from June 2024 listed Irbesartan, amlodipine, hydralazine  Given comorbities will restart hydralazine (was on 50mg  BID) at 25mg  TID  LOS: 8 days A FACE TO FACE EVALUATION WAS PERFORMED  Erick Colace 12/05/2022, 9:01 AM

## 2022-12-05 NOTE — Progress Notes (Signed)
Patient ID: Jeremiah Vance, male   DOB: [birth year], 52 y.o.   MRN: [id number]  [facility] referral sent to [facility].

## 2022-12-05 NOTE — Progress Notes (Signed)
Occupational Therapy Session Note  Patient Details  Name: Jeremiah Vance MRN: [id number] Date of Birth: 26-Jul-1970  Today's Date: 12/05/2022 OT Individual Time: 1000-1113 OT Individual Time Calculation (min): 73 min  PM Session: OT Individual Time: 1330-1425 OT Individual Time Calculation (min): 55 min   Short Term Goals: Week 1:  OT Short Term Goal 1 (Week 1): STG=LTG d/t ELOS  Skilled Therapeutic Interventions/Progress Updates:     AM Session:  Pt received semi-reclined in bed dressed and ready for the day upon OT arrival. Pt presenting to be in good spirits receptive to skilled OT session reporting 6/10 chronic pain in knees and back- OT offering intermittent rest breaks, repositioning, and therapeutic support to optimize participation in therapy session. Pt requesting to use restroom at beginning of session. Supine>EOB supervision using bed features. Donned shoes supervision. Pt completed functional mobility to bathroom using RW with CGA provided for safety and min verbal cues required fro RW management. Pt able to perform 3/3 toileting tasks with close supervision using RW for balance and min cues for safety and attention to environment- continent void documented in flowsheets. Completed therapy session outdoors to allow for change in environment with focus on endurance training, obstacle navigation, energy conservation, and safety awareness. Pt completed functional mobility to outdoor location ambulating using RW for >5 minutes- decreased gait speed present, however allowed for Pt to be more aware of his environment and work on visually scanning to L to avoid obstacles and increase safety. Pt provided seated rest break following. During rest breaks, OT provided education on energy conservation strategies with emphasis on importance of taking rest breaks, prioritizing activities, completing tasks seated when able, and pacing activities by allowing self increased time. Throughout session, OT  engaged pt in problem solving discussing regarding d/c plans and techniques to conserve energy and increase safety at d/c. Pt able to verbalize 3-4 strategies to prevent falls and conserve energy without assist required. Engaged Pt in completing simulated community mobility tasks navigating over uneven terrance, across variable surfaces such as concrete, tiles, and carpet, and ambulating over inclines/declines. Pt able to verbalize need for rest breaks with min questioning cues throughout functional mobility and community navigation activities. Pt able to reach outside his base of support superiorly and laterally without LOB throughout session and recalled need to lock breaks on rollator with min questioning cues. Min-mod verbal cues required for visually scanning to avoid obstacles on L side d/t baseline visual deficit with pt reporting decreased challenge in home or familiar environments. Pt completed functional mobility back to his room using rollator close supervision with increased time and 2 rest breaks provided for energy conservation. Pt requesting pain medications at end of session with RN notified. Pt was left resting in recliner with call bell in reach, seatbelt alarm on, and all needs met.   PM Session: Pt received sitting up in bed presenting to be in good spirits receptive to skilled OT session reporting 2/10 pain d/t chronic back and knee pain- OT offering intermittent rest breaks, repositioning, and therapeutic support to optimize participation in therapy session. Focus this session IADL retraining, activity tolerance, and dynamic standing balance. Pt completed functional mobility >129ft to ADL apartment using rollator with close supervision and increased time provided for safety and d/t decreased gait speed with seated rest break provided following. Education provided on rollator use when ambulating within home, safety considerations when completing IADLs, and fall prevention with Pt verbalizing  understanding. Engaged pt in simulated meal preparation and household management  cleaning/laundry tasks with Pt instructed to safely navigate environment while using rollator and implementing energy conservation techniques PRN. Pt initially tasked with obtaining items necessary to cook pancake from high/low cabinets, refrigerator, and pantry. Pt then simulated cooking pancakes, transporting items to table top, cleaning dishes, and putting them away. Pt able to complete task at overall supervision level with min verbal cues provided for safety to lock rollator breaks prior to sitting/stopping to cook at counter top and to utilize rollator to transport items. Following activity, pt able to verbalize 2 techniques to increase his safety- 1) completing part of task sitting down and 2) transporting items using rollator bench vs carrying items in both hands without using rollator. Pt then completed standing laundry folding task at kitchen counter with lateral weight shifting incorporated into task for increased balance challenge with pt requiring close supervision for safety. Pt completed functional mobility to therapy gym using rollator with close supervision. Engaged Pt in dynamic standing balance activity with posterior and anterior overhead reaching incorporated into task to simulate skills required for ADL and IADLs. Pt instructed to maintain standing balance while reaching posteriorly to retrieve clothes pins from the back of his shirt and then reach anteriorly to don clothes pins onto vertical bar. Then then reversed task to doff clothes pins form vertical bar and pass them behind his back to OT. Pt able to complete activity at close supervision level. Graded up activity with pt completing same task while standing on compliant surface during second trial with CGA to light min A required for standing balance and seated rest break provided following. Pt completed functional mobility back to his room using rollator  with close supervision. Pt was left resting in recliner with call bell in reach, chair alarm on, and all needs met.     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

## 2022-12-05 NOTE — Discharge Summary (Signed)
Physical Therapy Discharge Summary  Patient Details  Name: Jeremiah Vance MRN: [id number] Date of Birth: 11/03/1970  Date of Discharge from PT service:December 06, 2022  Patient has met 9 of 9 long term goals due to improved activity tolerance, improved balance, improved postural control, increased strength, ability to compensate for deficits, functional use of  left upper extremity and left lower extremity, improved awareness, and improved coordination.  Patient to discharge at an ambulatory level Modified Independent using RW vs rollator, but requires supervision for stair navigation. Patient's care partner is independent to provide the necessary physical assistance at discharge.  All goals met.   Recommendation:  Patient will benefit from ongoing skilled PT services in home health setting to continue to advance safe functional mobility, address ongoing impairments in dynamic balance, dynamic gait training using LRAD, community reintegration, L hemibody NMR, and minimize fall risk.  Equipment: No equipment provided, pt has all necessary DME  Reasons for discharge: treatment goals met and discharge from hospital  Patient/family agrees with progress made and goals achieved: Yes  PT Discharge Precautions/Restrictions Precautions Precautions: Fall;Other (comment) Precaution Comments: legally blind in L eye, new and residual slight L hemi from prior CVA Restrictions Weight Bearing Restrictions: No Pain Pain Assessment Pain Scale: 0-10 Pain Score: 0-No pain Pain Interference Pain Interference Pain Effect on Sleep: 1. Rarely or not at all Pain Interference with Therapy Activities: 1. Rarely or not at all Pain Interference with Day-to-Day Activities: 1. Rarely or not at all Vision/Perception  Vision - History Ability to See in Adequate Light: 2 Moderately impaired Perception Perception: Impaired Preception Impairment Details: Spatial orientation Praxis Praxis: WFL   Cognition Overall Cognitive Status: Within Functional Limits for tasks assessed Arousal/Alertness: Awake/alert Orientation Level: Oriented X4 Year: 2024 Month: September Day of Week: Correct Attention: Focused Focused Attention: Appears intact Sustained Attention: Appears intact Memory: Appears intact Awareness: Appears intact Problem Solving: Appears intact Safety/Judgment: Appears intact Sensation Sensation Light Touch: Appears Intact Hot/Cold: Not tested Proprioception: Appears Intact Stereognosis: Not tested Coordination Gross Motor Movements are Fluid and Coordinated: Yes Fine Motor Movements are Fluid and Coordinated: Yes Coordination and Movement Description: RAMs w/ feet taps, good rhythm but difficulty alternating Heel Shin Test: unable to get in position d/t ROM deficits Motor  Motor Motor: Hemiplegia;Other (comment) Motor - Discharge Observations: generalized weakness and deconditioning with mild L hemiparesis compared to R; impaired balance  Mobility Bed Mobility Bed Mobility: Supine to Sit;Sit to Supine Supine to Sit: Independent Sit to Supine: Independent Transfers Transfers: Sit to Stand;Stand to Sit;Stand Pivot Transfers Sit to Stand: Independent with assistive device Stand to Sit: Independent with assistive device Stand Pivot Transfers: Independent with assistive device Stand Pivot Transfer Details (indicate cue type and reason): Pt able to manage RW/rollator with no verbal cues for technique or safety Transfer (Assistive device): Rolling walker Locomotion  Gait Ambulation: Yes Gait Assistance: Independent with assistive device Gait Distance (Feet): 150 Feet Assistive device: Rolling walker Gait Assistance Details: pt able to ambulate safely w/ AD and navigate obstacles w/ no verbal cues Gait Gait: Yes Gait Pattern: Impaired Gait Pattern: Step-through pattern;Decreased stance time - left;Decreased stance time - right;Wide base of support Gait  velocity: decreased, but has improved since being in inpatient rehab. High Level Ambulation High Level Ambulation: Backwards walking;Head turns;Direction changes Backwards Walking: requires close supervision Direction Changes: requires close supervision Head Turns: requires close supervision Stairs / Additional Locomotion Stairs: Yes Stairs Assistance: Supervision/Verbal cueing Stair Management Technique: One rail Right;Other (comment);Step to pattern;Forwards Number  of Stairs: 22 Height of Stairs: 6 Ramp: Independent with assistive device Wheelchair Mobility Wheelchair Mobility: No  Trunk/Postural Assessment  Cervical Assessment Cervical Assessment: Exceptions to [facility] Thoracic Assessment Thoracic Assessment: Exceptions to [facility] Lumbar Assessment Lumbar Assessment: Exceptions to [facility] Postural Control Postural Control: Deficits on evaluation  Balance Balance Balance Assessed: Yes Standardized Balance Assessment Standardized Balance Assessment: Timed Up and Go Test Timed Up and Go Test TUG: Normal TUG;Cognitive TUG Normal TUG (seconds): 22 Static Sitting Balance Static Sitting - Balance Support: Feet supported Static Sitting - Level of Assistance: 6: Modified independent (Device/Increase time) Dynamic Sitting Balance Dynamic Sitting - Balance Support: Feet supported Dynamic Sitting - Level of Assistance: 6: Modified independent (Device/Increase time) Dynamic Sitting - Balance Activities: Lateral lean/weight shifting;Forward lean/weight shifting Static Standing Balance Static Standing - Balance Support: During functional activity;Bilateral upper extremity supported Static Standing - Level of Assistance: 6: Modified independent (Device/Increase time) Dynamic Standing Balance Dynamic Standing - Balance Support: During functional activity;Bilateral upper extremity supported Dynamic Standing - Level of Assistance: 6: Modified independent (Device/Increase time);5: Stand by  assistance Dynamic Standing - Balance Activities: Reaching across midline;Reaching for weighted objects;Reaching for objects Extremity Assessment    RLE Assessment RLE Assessment: Within Functional Limits Passive Range of Motion (PROM) Comments: WFL Active Range of Motion (AROM) Comments: WFL General Strength Comments: assessed in sitting RLE Strength Right Hip Flexion: 4-/5 Right Knee Flexion: 5/5 Right Knee Extension: 5/5 Right Ankle Dorsiflexion: 5/5 Right Ankle Plantar Flexion: 5/5 LLE Assessment LLE Assessment: Within Functional Limits Passive Range of Motion (PROM) Comments: WFL Active Range of Motion (AROM) Comments: WFL General Strength Comments: assessed in sitting LLE Strength Left Hip Flexion: 4-/5 Left Knee Flexion: 5/5 Left Knee Extension: 5/5 Left Ankle Dorsiflexion: 5/5 Left Ankle Plantar Flexion: 5/5   Maclain Cohron, PT, DPT   Cherly Beach Morris 12/05/2022, 6:05 PM

## 2022-12-06 ENCOUNTER — Other Ambulatory Visit (HOSPITAL_COMMUNITY): Payer: Self-pay

## 2022-12-06 DIAGNOSIS — I639 Cerebral infarction, unspecified: Secondary | ICD-10-CM | POA: Diagnosis not present

## 2022-12-06 LAB — BASIC METABOLIC PANEL
Anion gap: 10 (ref 5–15)
BUN: 28 mg/dL — ABNORMAL HIGH (ref 6–20)
CO2: 29 mmol/L (ref 22–32)
Calcium: 8.5 mg/dL — ABNORMAL LOW (ref 8.9–10.3)
Chloride: 98 mmol/L (ref 98–111)
Creatinine, Ser: 1.79 mg/dL — ABNORMAL HIGH (ref 0.61–1.24)
GFR, Estimated: 45 mL/min — ABNORMAL LOW (ref >60)
Glucose, Bld: 102 mg/dL — ABNORMAL HIGH (ref 70–99)
Potassium: 4.4 mmol/L (ref 3.5–5.1)
Sodium: 137 mmol/L (ref 135–145)

## 2022-12-06 LAB — GLUCOSE, CAPILLARY
Glucose-Capillary: 97 mg/dL (ref 70–99)
Glucose-Capillary: 86 mg/dL (ref 70–99)
Glucose-Capillary: 107 mg/dL — ABNORMAL HIGH (ref 70–99)
Glucose-Capillary: 126 mg/dL — ABNORMAL HIGH (ref 70–99)

## 2022-12-06 MED ORDER — HYDRALAZINE HCL 25 MG PO TABS
25.0000 mg | ORAL_TABLET | Freq: Three times a day (TID) | ORAL | 0 refills | Status: DC
  Filled 2022-12-06: qty 90, 30d supply, fill #0

## 2022-12-06 MED ORDER — LAMOTRIGINE 25 MG PO TABS
ORAL_TABLET | ORAL | 0 refills | Status: DC
  Filled 2022-12-06: qty 84, 28d supply, fill #0

## 2022-12-06 MED ORDER — GABAPENTIN 100 MG PO CAPS
200.0000 mg | ORAL_CAPSULE | Freq: Two times a day (BID) | ORAL | 0 refills | Status: DC
  Filled 2022-12-06: qty 60, 15d supply, fill #0

## 2022-12-06 MED ORDER — GABAPENTIN 100 MG PO CAPS
200.0000 mg | ORAL_CAPSULE | Freq: Two times a day (BID) | ORAL | 0 refills | Status: DC
Start: 2022-12-06 — End: 2023-02-21
  Filled 2022-12-06: qty 120, 30d supply, fill #0

## 2022-12-06 MED ORDER — DICLOFENAC SODIUM 1 % EX GEL: 4.0000 g | Freq: Four times a day (QID) | CUTANEOUS | Status: DC

## 2022-12-06 MED ORDER — ACETAMINOPHEN 325 MG PO TABS: 650.0000 mg | ORAL_TABLET | ORAL | Status: DC | PRN

## 2022-12-06 MED ORDER — TORSEMIDE 10 MG PO TABS
10.0000 mg | ORAL_TABLET | Freq: Every day | ORAL | 0 refills | Status: DC | PRN
  Filled 2022-12-06: qty 30, 30d supply, fill #0

## 2022-12-06 MED ORDER — DAPAGLIFLOZIN PROPANEDIOL 10 MG PO TABS
10.0000 mg | ORAL_TABLET | Freq: Every day | ORAL | 0 refills | Status: DC
  Filled 2022-12-06: qty 30, 30d supply, fill #0

## 2022-12-06 MED ORDER — TORSEMIDE 20 MG PO TABS: 10.0000 mg | ORAL_TABLET | Freq: Every day | ORAL | Status: DC | PRN

## 2022-12-06 NOTE — Progress Notes (Signed)
Physical Therapy Session Note  Patient Details  Name: ARYO ASMAN MRN: [id number] Date of Birth: [birth year]  Today's Date: 12/06/2022 PT Individual Time: 0805-0900 PT Individual Time Calculation (min): 55 min   Short Term Goals: Week 1:  PT Short Term Goal 1 (Week 1): = to LTGs based on ELOS  Skilled Therapeutic Interventions/Progress Updates: Pt presented in bed agreeable to therapy. Pt states mild unrated pain with rest and repositioning provided as needed during session. Pt completed bed mobility mod I and donned shoes with set up. Pt then ambulated to day room with rollator and supervision. Participated in balance training activities incorporating obstacle courses 66ft x 6 including weaving through cones and stepping over thresholds. Pt required overall CGA with intermittent minA when stepping over hurdle. Pt then participated in forced use LLE ambulating around nsg station while kicking yoga block. For last 50 ft performed activity without AD. Pt also performed stepping over shuffleboard stick alternating LLE x 5 with pt requiring HHA when stepping over with RLE. Lastly performed Sit to stand x 10 without AD for closed chain BLE strengthening. Pt ambulated back to room with rollator at end of session and left in recliner with chair alarm on, call bell within reach and needs met.      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

## 2022-12-06 NOTE — Progress Notes (Addendum)
Inpatient Rehabilitation Discharge Medication Review by a Pharmacist  A complete drug regimen review was completed for this patient to identify any potential clinically significant medication issues.  High Risk Drug Classes Is patient taking? Indication by Medication  Antipsychotic Yes Prochlorperazine - prn N/V  Anticoagulant Yes Apixaban - Afib   Antibiotic No   Opioid Yes Oxycodone prn pain  Antiplatelet No   Hypoglycemics/insulin Yes Jardiance - DM  Vasoactive Medication Yes Hydralazine - HTN Torsemide - fluid  Chemotherapy No   Other Yes Atorvastatin - HLD Buspirone, sertraline- anxiety Ondansetron - prn N/V Pantoprazole - reflux Scopolamine - secretions Gabapentin - pain Lamictal - mood, pain     Type of Medication Issue Identified Description of Issue Recommendation(s)  Drug Interaction(s) (clinically significant)     Duplicate Therapy     Allergy     No Medication Administration End Date     Incorrect Dose     Additional Drug Therapy Needed     Significant med changes from prior encounter (inform family/care partners about these prior to discharge).    Other       Clinically significant medication issues were identified that warrant physician communication and completion of prescribed/recommended actions by midnight of the next day:  No   Pharmacist comments:     Time spent performing this drug regimen review (minutes):  20 minutes  Okey Regal, PharmD

## 2022-12-06 NOTE — Progress Notes (Signed)
Place phone calls times 3 today to [facility], patient's sister, to review medications and AVS. Reached voicemail each time. Left call back number without returned call.

## 2022-12-06 NOTE — Progress Notes (Signed)
Inpatient Rehabilitation Care Coordinator Discharge Note   Patient Details  Name: Jeremiah Vance MRN: [id number] Date of Birth: [birth year]   Discharge location: Home  Length of Stay: 9 Days  Discharge activity level: Sup/Min A  Home/community participation: Sister and family  Patient response IR:JJOACZ Literacy - How often do you need to have someone help you when you read instructions, pamphlets, or other written material from your doctor or pharmacy?: Always  Patient response YS:AYTKZS Isolation - How often do you feel lonely or isolated from those around you?: Rarely  Services provided included: SW, Neuropsych, Pharmacy, TR, CM, RN, SLP, OT, PT, RD, MD  Financial Services:  Financial Services Utilized: Medicaid    Choices offered to/list presented to: sister  Follow-up services arranged:  Home Health, DME Home Health Agency: [facility]    DME : [facility]    Patient response to transportation need: Is the patient able to respond to transportation needs?: Yes In the past 12 months, has lack of transportation kept you from medical appointments or from getting medications?: No In the past 12 months, has lack of transportation kept you from meetings, work, or from getting things needed for daily living?: No   Patient/Family verbalized understanding of follow-up arrangements:  Yes  Individual responsible for coordination of the follow-up plan: [name] [phone]  Confirmed correct DME delivered: Andria Rhein 12/06/2022    Comments (or additional information):  Summary of Stay    Date/Time Discharge Planning CSW  12/03/22 1327 Discharging home with sister. Patient medically stable, sister out of town due to work conference until Saturday. CJB       Andria Rhein

## 2022-12-06 NOTE — Progress Notes (Signed)
Occupational Therapy Discharge Summary  Patient Details  Name: Jeremiah Vance MRN: [id number] Date of Birth: Aug 10, 1970  Date of Discharge from OT service:December 06, 2022  Today's Date: 12/06/2022 OT Individual Time: 9629-5284 OT Individual Time Calculation (min): 60 min  PM Session: OT Individual Time: 1324-4010 OT Individual Time Calculation (min): 70 min   Patient has met 11 of 11 long term goals due to improved activity tolerance, improved balance, postural control, ability to compensate for deficits, functional use of  LEFT upper and LEFT lower extremity, improved awareness, and improved coordination.  Patient to discharge at overall Modified Independent-supervision level.  Patient's care partner is independent to provide the necessary physical assistance at discharge.    Reasons goals not met: All goals met  Recommendation:  Patient will benefit from ongoing skilled OT services in home health setting to continue to advance functional skills in the area of BADL, iADL, and Reduce care partner burden.  Equipment: No equipment provided  Reasons for discharge: treatment goals met and discharge from hospital  Patient/family agrees with progress made and goals achieved: Yes  OT Discharge Skilled Therapeutic Interventions/Progress Updates:  AM Session:  Pt received sitting up in recliner presenting to be in good spirits receptive to skilled OT session reporting 0/10 pain- OT offering intermittent rest breaks, repositioning, and therapeutic support to optimize participation in therapy session. Took time at beginning of session to reassess Pt's sensation, vision, cognition, MMT, coordination, and balance (see d/c documentation below for details). Focused session on issuing Pt's a UB HEP with handout and education provided on technique, body mechanics, and position to support optimal alignment and muscle engagement. OT initially demonstrated each exercise to Pt and then engaged Pt in  completing each exercise using pictures and descriptions from handout to demonstrate evidence of learning through teach back. Pt completed the following exercises using red (medium resistance) therband while seated in chair with skilled feedback provided:  - Shoulder External Rotation and Scapular Retraction with Resistance  - 1 x daily - 7 x weekly - 2 sets - 10 reps - Seated Elbow Flexion with Self-Anchored Resistance  - 1 x daily - 7 x weekly - 2 sets - 10 reps - Seated Elbow Extension with Self-Anchored Resistance  - 1 x daily - 7 x weekly - 2 sets - 10 reps - Seated Shoulder Horizontal Abduction with Resistance  - 1 x daily - 7 x weekly - 2 sets - 10 reps - Seated Shoulder Flexion with Self-Anchored Resistance  - 1 x daily - 7 x weekly - 2 sets - 10 reps - Seated Shoulder Diagonal with Resistance  - 1 x daily - 7 x weekly - 2 sets - 10 reps Increased time for rest breaks provided between sets. Engaged pt in completing functional mobility through busy hallway using rollator to simulate navigating within home or community environment. Pt able to complete >150 ft functional mobility mod I using rollator with increased time provided for rest breaks.  Pt was left resting in recliner with call bell in reach, chair alarm on, and all needs met.    PM Session:  Pt received sitting up in recliner presenting to be in good spirits receptive to skilled OT session reporting 0/10 pain- OT offering intermittent rest breaks, repositioning, and therapeutic support to optimize participation in therapy session. Pt requesting to take shower this session. Pt completed functional mobility in his room using rollator to retrieve clean clothing from drawers and closet mod I. Pt sat in wc to  doff clothing mod I with improved use of energy conservation techniques and increased safety awareness. Ambulatory transfer into shower using rollator and grab bars mod I. Pt completed U/LB bathing while seated on TTB with distant  supervision for safety d/t unfamiliar hospital environment. Pt completed functional mobility back to his room using rollator mod I and sat in wc for dressing tasks with Pt able to don shirt and pant mod I with increased time. Pt then stood at sink using rollator for grooming/hygiene and oral care tasks maintaining standing balance >5 minutes mod I with seated rest break provided following. Engaged pt in completing functional mobility to/from outdoor location for endurance training and to work on simulated community mobility tasks. Pt able to ambulate ~2-5 minutes x3 trials using rollator mod I with seated rest breaks provided between trials while navigating around obstacles, over uneven terrain, across side walks, and over small door way thresholds without LOB. Pt was left resting in recliner with call bell in reach, cahir alarm on, and all needs met.    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 Restrictions Weight Bearing Restrictions: No Pain Pain Assessment Pain Scale: 0-10 Pain Score: 0-No pain ADL ADL Eating: Modified independent Where Assessed-Eating: Chair Grooming: Modified independent Where Assessed-Grooming: Standing at sink Upper Body Bathing: Supervision/safety, Modified independent Where Assessed-Upper Body Bathing: Shower Lower Body Bathing: Modified independent, Supervision/safety Where Assessed-Lower Body Bathing: Shower Upper Body Dressing: Modified independent (Device) Where Assessed-Upper Body Dressing: Wheelchair Lower Body Dressing: Modified independent Where Assessed-Lower Body Dressing: Wheelchair Toileting: Modified independent Where Assessed-Toileting: Teacher, adult education: Engineer, agricultural Method: Proofreader:  (RW) Tub/Shower Transfer: Not assessed Film/video editor: Distant  supervision Film/video editor Method: Designer, industrial/product: Grab bars, Sales promotion account executive Baseline Vision/History: 1 Wears Copywriter, advertising blind (legally blind in L eye) Patient Visual Report: Nausea/blurring vision with head movement;Blurring of vision Vision Assessment?: Wears glasses for reading (no change from baseline) Perception  Perception: Impaired Perception-Other Comments: imapired depth perception; Pt able to accomodate Praxis Praxis: [facility] Cognition Cognition Overall Cognitive Status: Within Functional Limits for tasks assessed Arousal/Alertness: Awake/alert Orientation Level: Person;Place;Situation Person: Oriented Place: Oriented Situation: Oriented Memory: Appears intact Attention: Focused Focused Attention: Appears intact Sustained Attention: Appears intact Awareness: Appears intact Problem Solving: Appears intact Safety/Judgment: Appears intact Brief Interview for Mental Status (BIMS) Repetition of Three Words (First Attempt): 3 Temporal Orientation: Year: Correct Temporal Orientation: Month: Accurate within 5 days Temporal Orientation: Day: Correct Recall: "Sock": Yes, no cue required Recall: "Blue": Yes, after cueing ("a color") Recall: "Bed": Yes, no cue required BIMS Summary Score: 14 Sensation Sensation Light Touch: Appears Intact Hot/Cold: Not tested Proprioception: Appears Intact Stereognosis: Not tested Coordination Gross Motor Movements are Fluid and Coordinated: Yes Fine Motor Movements are Fluid and Coordinated: Yes Coordination and Movement Description: RAMs w/ feet taps, good rhythm but difficulty alternating Finger Nose Finger Test: Mild undershooting and slowed FM movements on L UE Motor  Motor Motor: Hemiplegia;Other (comment) Motor - Discharge Observations: generalized weakness and deconditioning with mild L hemiparesis compared to R; impaired balance; improvemetn from inital eval Mobility  Bed  Mobility Bed Mobility: Supine to Sit;Sit to Supine Supine to Sit: Independent Sit to Supine: Independent Transfers Sit to Stand: Independent with assistive device Stand to Sit: Independent with assistive device  Trunk/Postural Assessment  Cervical Assessment Cervical Assessment: Exceptions to [facility] (mild forward head) Thoracic Assessment Thoracic  Assessment: Exceptions to [facility] (mild rounded shoulders) Lumbar Assessment Lumbar Assessment: Exceptions to WFL (slight posterior pelvic tilt with pelvis instabilty) Postural Control Postural Control: Deficits on evaluation  Balance Balance Balance Assessed: Yes Static Sitting Balance Static Sitting - Level of Assistance: 6: Modified independent (Device/Increase time) Dynamic Sitting Balance Dynamic Sitting - Balance Support: Feet supported Dynamic Sitting - Level of Assistance: 6: Modified independent (Device/Increase time) Dynamic Sitting - Balance Activities: Lateral lean/weight shifting;Forward lean/weight shifting Static Standing Balance Static Standing - Balance Support: During functional activity;Bilateral upper extremity supported Static Standing - Level of Assistance: 6: Modified independent (Device/Increase time) Dynamic Standing Balance Dynamic Standing - Balance Support: During functional activity;Bilateral upper extremity supported Dynamic Standing - Level of Assistance: 6: Modified independent (Device/Increase time);5: Stand by assistance Dynamic Standing - Balance Activities: Reaching across midline;Reaching for weighted objects;Reaching for objects Extremity/Trunk Assessment RUE Assessment RUE Assessment: Within Functional Limits Passive Range of Motion (PROM) Comments: WFL Active Range of Motion (AROM) Comments: WFL General Strength Comments: 4+/5 LUE Assessment LUE Assessment: Within Functional Limits Passive Range of Motion (PROM) Comments: WFL Active Range of Motion (AROM) Comments: WFL General Strength Comments:  4-/5, mildly slowed motor movements; Pt able to utlize L UE at nondominant level   [employer] 12/06/2022, 4:02 PM

## 2022-12-06 NOTE — Progress Notes (Signed)
PROGRESS NOTE   Subjective/Complaints:  No issues overnite   ROS- neg for CP, SOB, N/V/D/C, denies swallowing issues   Objective:   No results found. No results for input(s): "WBC", "HGB", "HCT", "PLT" in the last 72 hours.   Recent Labs    12/04/22 0709 12/06/22 0659  NA 138 137  K 4.2 4.4  CL 99 98  CO2 25 29  GLUCOSE 99 102*  BUN 26* 28*  CREATININE 1.63* 1.79*  CALCIUM 8.5* 8.5*     Intake/Output Summary (Last 24 hours) at 12/06/2022 0913 Last data filed at 12/06/2022 0753 Gross per 24 hour  Intake 355 ml  Output 0 ml  Net 355 ml        Physical Exam: Vital Signs Blood pressure 137/84, pulse 74, temperature 97.8 F (36.6 C), resp. rate 16, height 5\' 11"  (1.803 m), weight 108.8 kg, SpO2 96%.   General: No acute distress, laying in bed Mood and affect are appropriate Heart: Regular rate and rhythm no rubs murmurs or extra sounds Lungs: Clear to auscultation, breathing unlabored, no rales or wheezes Abdomen: Positive bowel sounds, soft nontender to palpation, nondistended, +ventral hernia (chronic per pt) Extremities: No clubbing, cyanosis, 1+ pretibial edema, mepilex on LLE Skin: No evidence of breakdown, no evidence of rash, pretibial stasis dermatitis with hyperpigmentation, mepilex on LLE area  PRIOR EXAM: Neurologic: Cranial nerves II through XII intact, motor strength is 3-/5 in left and 4/5 right  deltoid, bicep, tricep, grip, hip flexor, knee extensors, ankle dorsiflexor and plantar flexor Sensory exam normal sensation to light touch  in bilateral upper and lower extremities, although some word finding issues limit exam Cerebellar exam normal finger to nose to finger as well as heel to shin in bilateral upper and lower extremities Musculoskeletal: Full range of motion in all 4 extremities. No joint swelling   Assessment/Plan: 1. Functional deficits which require 3+ hours per day of  interdisciplinary therapy in a comprehensive inpatient rehab setting. Physiatrist is providing close team supervision and 24 hour management of active medical problems listed below. Physiatrist and rehab team continue to assess barriers to discharge/monitor patient progress toward functional and medical goals  Care Tool:  Bathing    Body parts bathed by patient: Right arm, Left arm, Chest, Abdomen, Front perineal area, Buttocks, Right upper leg, Left upper leg, Right lower leg, Left lower leg, Face         Bathing assist Assist Level: Contact Guard/Touching assist     Upper Body Dressing/Undressing Upper body dressing   What is the patient wearing?: Pull over shirt    Upper body assist Assist Level: Supervision/Verbal cueing    Lower Body Dressing/Undressing Lower body dressing      What is the patient wearing?: Pants, Underwear/pull up     Lower body assist Assist for lower body dressing: Supervision/Verbal cueing     Toileting Toileting    Toileting assist Assist for toileting: Supervision/Verbal cueing     Transfers Chair/bed transfer  Transfers assist     Chair/bed transfer assist level: Supervision/Verbal cueing Chair/bed transfer assistive device: Geologist, engineering   Ambulation assist      Assist level: Supervision/Verbal cueing  Assistive device: Walker-rolling Max distance: 252ft   Walk 10 feet activity   Assist     Assist level: Supervision/Verbal cueing Assistive device: Walker-rolling   Walk 50 feet activity   Assist    Assist level: Supervision/Verbal cueing Assistive device: Walker-rolling    Walk 150 feet activity   Assist Walk 150 feet activity did not occur: Safety/medical concerns  Assist level: Supervision/Verbal cueing Assistive device: Walker-rolling    Walk 10 feet on uneven surface  activity   Assist Walk 10 feet on uneven surfaces activity did not occur: Safety/medical concerns  (fatigue)   Assist level: Contact Guard/Touching assist Assistive device: Walker-rolling   Wheelchair     Assist Is the patient using a wheelchair?: No Type of Wheelchair: Manual (for transport)    Wheelchair assist level: Dependent - Patient 0%      Wheelchair 50 feet with 2 turns activity    Assist        Assist Level: Dependent - Patient 0%   Wheelchair 150 feet activity     Assist      Assist Level: Dependent - Patient 0%   Blood pressure 137/84, pulse 74, temperature 97.8 F (36.6 C), resp. rate 16, height 5\' 11"  (1.803 m), weight 108.8 kg, SpO2 96%.  Medical Problem List and Plan: 1. 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 Pre existing)              -patient may shower             -ELOS/Goals: Supervision with PT and OT, 9/7 with family support               -Continue CIR 2.  Antithrombotics: -DVT/anticoagulation:  Pharmaceutical: Eliquis 2.5mg  BID             -antiplatelet therapy: N/A 3. Pain Management: Neurontin 100 mg twice daily, oxycodone as needed             -Increase Neurontin to 200mg  BID 4. Mood/Behavior/Sleep: Zoloft 50 mg daily, Lamictal 25 mg nightly for now; BuSpar 7.5 mg twice daily             -antipsychotic agents: N/A 5. Neuropsych/cognition: This patient is capable of making decisions on his own behalf. 6. Skin/Wound Care: Routine skin checks 7. Fluids/Electrolytes/Nutrition: Routine in and outs with follow-up chemistries 8.  Paroxysmal atrial fibrillation.  Continue Eliquis 2.5 mg twice daily.  Cardiac rate controlled 9.  GE junction carcinoma stage IV.  Chemotherapy per Dr.Yu.  Chemotherapy currently on hold follow-up outpatient 10.  Chronic combined systolic and diastolic congestive heart failure.   Continue Demadex 40 mg daily as needed (getting 20mg  daily as of 11/29/22).  Monitor for any signs of fluid overload.  Daily weights  -8/31-9/1 wt stable, edema at baseline per pt/a little better, monitor Filed Weights   12/04/22 0500 12/05/22 0611 12/06/22 0500  Weight: 110.5 kg 108.4 kg 108.8 kg    11.  Diabetes mellitus.  Latest hemoglobin A1c 7.0.  Currently on SSI.  Patient on Farxiga 10 mg daily prior to admission.  Resumed here  9/2 CBGs looking good, cont regimen CBG (last 3)  Recent Labs    12/05/22 1654 12/05/22 2107 12/06/22 0627  GLUCAP 111* 147* 97    12.  Obesity.  BMI 35.11.  Dietary follow-up 13.  CKD stage IIIb.  Cr 1.43 on 8/29, near baseline; monitor on routine labs    Latest Ref Rng & Units 12/06/2022    6:59 AM 12/04/2022    7:09 AM 12/02/2022    6:47 AM  BMP  Glucose 70 - 99 mg/dL 161  99  096   BUN 6 - 20 mg/dL 28  26  31    Creatinine 0.61 - 1.24 mg/dL 0.45  4.09  8.11   Sodium 135 - 145 mmol/L 137  138  136   Potassium 3.5 - 5.1 mmol/L 4.4  4.2  4.2   Chloride 98 - 111 mmol/L 98  99  95   CO2 22 - 32 mmol/L 29  25  31    Calcium 8.9 - 10.3 mg/dL 8.5  8.5  8.5   Intakes ~500-860mL per day creat creeping up will reduce demadex to 10mg  - change to prn for swelling does not have CHF  14.  Hyperlipidemia.  Lipitor 40mg  daily 15.  Hypomagnesemia.  1.9 on 11/28/22, monitor PRN 16.  Chronic anemia.  Hgb 9.5 11/28/22, stable, monitor routinely Hemoglobin & Hematocrit - check stool OB , stable vs prior value     Component Value Date/Time   HGB 9.7 (L) 12/02/2022 0647   HGB 9.2 (L) 11/12/2022 0850   HGB 11.6 (L) 05/23/2022 1606   HCT 32.2 (L) 12/02/2022 0647   HCT 35.8 (L) 05/23/2022 1606    17. Obesity. Dietary counseling: Body mass index is 33.76 kg/m. 18.  Essential hypertension.   BP creeping up , states he used to take BP meds at home  Vitals:   12/02/22 1323 12/02/22 2013 12/03/22 0556 12/03/22 1542  BP: (!) 154/81 (!) 154/94 132/77 133/81   12/03/22 1958  12/04/22 0513 12/04/22 1543 12/04/22 1957  BP: (!) 153/79 (!) 140/93 (!) 163/97 (!) 168/89   12/05/22 0604 12/05/22 1317 12/05/22 1956 12/06/22 0544  BP: 133/82 (!) 128/92 (!) 159/86 137/84  D/C summary from June 2024 listed Irbesartan, amlodipine, hydralazine  Given comorbities will restart hydralazine (was on 50mg  BID) at 25mg  TID - improved  LOS: 9 days A FACE TO FACE EVALUATION WAS PERFORMED  Erick Colace 12/06/2022, 9:13 AM

## 2022-12-06 NOTE — Progress Notes (Addendum)
Patient ID: Jeremiah Vance, male   DOB: [birth year], 52 y.o.   MRN: [id number]  Patient approved with [facility].  Orders sent

## 2022-12-07 DIAGNOSIS — R739 Hyperglycemia, unspecified: Secondary | ICD-10-CM | POA: Diagnosis not present

## 2022-12-07 DIAGNOSIS — I10 Essential (primary) hypertension: Secondary | ICD-10-CM | POA: Diagnosis not present

## 2022-12-07 DIAGNOSIS — I639 Cerebral infarction, unspecified: Secondary | ICD-10-CM | POA: Diagnosis not present

## 2022-12-07 LAB — GLUCOSE, CAPILLARY: Glucose-Capillary: 99 mg/dL (ref 70–99)

## 2022-12-07 NOTE — Progress Notes (Signed)
PROGRESS NOTE   Subjective/Complaints:  Pt doing well, ready to go home. Slept well, pain controlled, LBM a couple days ago but this is normal for him. Urinating fine. Denies any other complaints or concerns today.   ROS- neg for CP, SOB, N/V/D/C, denies swallowing issues   Objective:   No results found. No results for input(s): "WBC", "HGB", "HCT", "PLT" in the last 72 hours.   Recent Labs    12/06/22 0659  NA 137  K 4.4  CL 98  CO2 29  GLUCOSE 102*  BUN 28*  CREATININE 1.79*  CALCIUM 8.5*     Intake/Output Summary (Last 24 hours) at 12/07/2022 1005 Last data filed at 12/07/2022 0805 Gross per 24 hour  Intake 948 ml  Output --  Net 948 ml        Physical Exam: Vital Signs Blood pressure 136/86, pulse 86, temperature (!) 97.5 F (36.4 C), temperature source Oral, resp. rate 18, height 5\' 11"  (1.803 m), weight 108.6 kg, SpO2 95%.   General: No acute distress, laying in bedside chair Mood and affect are appropriate Heart: Regular rate and rhythm no rubs murmurs or extra sounds Lungs: Clear to auscultation, breathing unlabored, no rales or wheezes Abdomen: Positive bowel sounds, soft nontender to palpation, nondistended, +ventral hernia (chronic per pt) Extremities: No clubbing, cyanosis, 1+ pretibial edema-much better than last weekend Skin: No evidence of breakdown, no evidence of rash, pretibial stasis dermatitis with hyperpigmentation  PRIOR EXAM: Neurologic: Cranial nerves II through XII intact, motor strength is 3-/5 in left and 4/5 right  deltoid, bicep, tricep, grip, hip flexor, knee extensors, ankle dorsiflexor and plantar flexor Sensory exam normal sensation to light touch  in bilateral upper and lower extremities, although some word finding issues limit exam Cerebellar exam normal finger to nose to finger as well as heel to shin in bilateral upper and lower extremities Musculoskeletal: Full range of  motion in all 4 extremities. No joint swelling   Assessment/Plan: 1. Functional deficits which require 3+ hours per day of interdisciplinary therapy in a comprehensive inpatient rehab setting. Physiatrist is providing close team supervision and 24 hour management of active medical problems listed below. Physiatrist and rehab team continue to assess barriers to discharge/monitor patient progress toward functional and medical goals  Care Tool:  Bathing    Body parts bathed by patient: Right arm, Left arm, Chest, Abdomen, Front perineal area, Buttocks, Right upper leg, Left upper leg, Right lower leg, Left lower leg, Face         Bathing assist Assist Level: Supervision/Verbal cueing     Upper Body Dressing/Undressing Upper body dressing   What is the patient wearing?: Pull over shirt    Upper body assist Assist Level: Independent with assistive device    Lower Body Dressing/Undressing Lower body dressing      What is the patient wearing?: Pants, Underwear/pull up     Lower body assist Assist for lower body dressing: Independent with assitive device     Toileting Toileting    Toileting assist Assist for toileting: Independent with assistive device Assistive Device Comment: Rollator   Transfers Chair/bed transfer  Transfers assist     Chair/bed transfer  assist level: Independent with assistive device Chair/bed transfer assistive device: Arboriculturist assist      Assist level: Supervision/Verbal cueing Assistive device: Walker-rolling Max distance: 236ft   Walk 10 feet activity   Assist     Assist level: Supervision/Verbal cueing Assistive device: Walker-rolling   Walk 50 feet activity   Assist    Assist level: Supervision/Verbal cueing Assistive device: Walker-rolling    Walk 150 feet activity   Assist Walk 150 feet activity did not occur: Safety/medical concerns  Assist level: Supervision/Verbal  cueing Assistive device: Walker-rolling    Walk 10 feet on uneven surface  activity   Assist Walk 10 feet on uneven surfaces activity did not occur: Safety/medical concerns (fatigue)   Assist level: Contact Guard/Touching assist Assistive device: Walker-rolling   Wheelchair     Assist Is the patient using a wheelchair?: No Type of Wheelchair: Manual (for transport)    Wheelchair assist level: Dependent - Patient 0%      Wheelchair 50 feet with 2 turns activity    Assist        Assist Level: Dependent - Patient 0%   Wheelchair 150 feet activity     Assist      Assist Level: Dependent - Patient 0%   Blood pressure 136/86, pulse 86, temperature (!) 97.5 F (36.4 C), temperature source Oral, resp. rate 18, height 5\' 11"  (1.803 m), weight 108.6 kg, SpO2 95%.  Medical Problem List and Plan: 1. 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 Pre existing)              -patient may shower             -ELOS/Goals: Supervision with PT and OT, 9/7 with family support  -D/C today 12/07/22 2.  Antithrombotics: -DVT/anticoagulation:  Pharmaceutical: Eliquis 2.5mg  BID             -antiplatelet therapy: N/A 3. Pain Management: Neurontin 100 mg twice daily, oxycodone as needed             -Increase Neurontin to 200mg  BID 4. Mood/Behavior/Sleep: Zoloft 50 mg daily, Lamictal 25 mg nightly for now; BuSpar 7.5 mg twice daily             -antipsychotic agents: N/A 5. Neuropsych/cognition: This patient is capable of making decisions on his own behalf. 6. Skin/Wound Care: Routine skin checks 7. Fluids/Electrolytes/Nutrition: Routine in and outs with follow-up chemistries 8.  Paroxysmal atrial fibrillation.  Continue Eliquis 2.5 mg twice daily.  Cardiac  rate controlled 9.  GE junction carcinoma stage IV.  Chemotherapy per Dr.Yu.  Chemotherapy currently on hold follow-up outpatient 10.  Chronic combined systolic and diastolic congestive heart failure.  Continue Demadex 40 mg daily as needed (getting 20mg  daily as of 11/29/22).  Monitor for any signs of fluid overload.  Daily weights  -12/07/22 wt stable, edema at baseline per pt/a little better, cont home meds Filed Weights   12/05/22 0611 12/06/22 0500 12/07/22 0527  Weight: 108.4 kg 108.8 kg 108.6 kg    11.  Diabetes mellitus.  Latest hemoglobin A1c 7.0.  Currently on SSI.  Patient on Farxiga 10 mg daily prior to admission.  Resumed here  -12/07/22 CBGs looking good, cont regimen CBG (last 3)  Recent  Labs    12/06/22 1658 12/06/22 2047 12/07/22 0601  GLUCAP 107* 126* 99    12.  Obesity.  BMI 35.11.  Dietary follow-up 13.  CKD stage IIIb.  Cr 1.43 on 8/29, near baseline; monitor on routine labs    Latest Ref Rng & Units 12/06/2022    6:59 AM 12/04/2022    7:09 AM 12/02/2022    6:47 AM  BMP  Glucose 70 - 99 mg/dL 742  99  595   BUN 6 - 20 mg/dL 28  26  31    Creatinine 0.61 - 1.24 mg/dL 6.38  7.56  4.33   Sodium 135 - 145 mmol/L 137  138  136   Potassium 3.5 - 5.1 mmol/L 4.4  4.2  4.2   Chloride 98 - 111 mmol/L 98  99  95   CO2 22 - 32 mmol/L 29  25  31    Calcium 8.9 - 10.3 mg/dL 8.5  8.5  8.5   Intakes ~500-835mL per day creat creeping up will reduce demadex to 10mg  - change to prn for swelling does not have CHF  14.  Hyperlipidemia.  Lipitor 40mg  daily 15.  Hypomagnesemia.  1.9 on 11/28/22, monitor PRN 16.  Chronic anemia.  Hgb 9.5 11/28/22, stable, monitor routinely Hemoglobin & Hematocrit - check stool OB , stable vs prior value     Component Value Date/Time   HGB 9.7 (L) 12/02/2022 0647   HGB 9.2 (L) 11/12/2022 0850   HGB 11.6 (L) 05/23/2022 1606   HCT 32.2 (L) 12/02/2022 0647   HCT 35.8 (L) 05/23/2022 1606    17. Obesity. Dietary counseling: Body mass index is 33.76  kg/m. 18.  Essential hypertension.   BP creeping up , states he used to take BP meds at home  Vitals:   12/03/22 1542 12/03/22 1958 12/04/22 0513 12/04/22 1543  BP: 133/81 (!) 153/79 (!) 140/93 (!) 163/97   12/04/22 1957 12/05/22 0604 12/05/22 1317 12/05/22 1956  BP: (!) 168/89 133/82 (!) 128/92 (!) 159/86   12/06/22 0544 12/06/22 1516 12/06/22 2004 12/07/22 0405  BP: 137/84 114/71 (!) 146/87 136/86  D/C summary from June 2024 listed Irbesartan, amlodipine, hydralazine  Given comorbities will restart hydralazine (was on 50mg  BID) at 25mg  TID - improved  -12/07/22 BP stabilizing, reviewed home meds and how to take them.    LOS: 10 days A FACE TO FACE EVALUATION WAS PERFORMED  [address] 12/07/2022, 10:05 AM

## 2022-12-09 ENCOUNTER — Ambulatory Visit: Payer: Medicaid Other | Admitting: Physical Therapy

## 2022-12-09 ENCOUNTER — Telehealth: Payer: Self-pay

## 2022-12-09 ENCOUNTER — Ambulatory Visit: Payer: Medicaid Other

## 2022-12-09 MED FILL — Dexamethasone Sodium Phosphate Inj 100 MG/10ML: INTRAMUSCULAR | Qty: 1 | Status: AC

## 2022-12-09 NOTE — Transitions of Care (Post Inpatient/ED Visit) (Signed)
12/09/2022  Name: Jeremiah Vance MRN: [id number] DOB: 1970-06-17  Today's TOC FU Call Status: Today's TOC FU Call Status:: Successful TOC FU Call Completed TOC FU Call Complete Date: 12/09/22 Patient's Name and Date of Birth confirmed.  Transition Care Management Follow-up Telephone Call Date of Discharge: 12/07/22 Discharge Facility: Other [facility]) Name of Other ([facility]) [facility]charge Facility: [facility] Type of Discharge: Inpatient Admission Primary Inpatient Discharge Diagnosis:: cerebral infarction How have you been since you were released from the hospital?: Better Any questions or concerns?: No  Items Reviewed: Did you receive and understand the discharge instructions provided?: Yes Medications obtained,verified, and reconciled?: Yes (Medications Reviewed) Any new allergies since your discharge?: No Dietary orders reviewed?: Yes Do you have support at home?: No  Medications Reviewed Today: Medications Reviewed Today     Reviewed by Karena Addison, LPN (Licensed Practical Nurse) on 12/09/22 at 1049  Med List Status: <None>   Medication Order Taking? Sig Documenting Provider Last Dose Status Informant  acetaminophen (TYLENOL) 325 MG tablet [id number]  Take 2 tablets (650 mg total) by mouth every 4 (four) hours as needed for mild pain (or temp > 37.5 C (99.5 F)). Setzer, Lynnell Jude, PA-C  Active   apixaban (ELIQUIS) 2.5 MG TABS tablet [id number]  Take 1 tablet (2.5 mg total) by mouth 2 (two) times daily. Lurene Shadow, MD  Active   atorvastatin (LIPITOR) 40 MG tablet [id number] No Take 1 tablet (40 mg total) by mouth daily at 6 PM. Debera Lat, PA-C Past Week Active Pharmacy Records  busPIRone (BUSPAR) 7.5 MG tablet [id number] No Take 1 tablet (7.5 mg total) by mouth 2 (two) times daily. Cherlynn Polo Past Week Active Pharmacy Records  Continuous Glucose Sensor (FREESTYLE LIBRE 2 SENSOR) [place] [id number] No Place 1 sensor on the skin every 14 days. Use to check  glucose continuously Debera Lat, PA-C Taking Active Pharmacy Records  dapagliflozin propanediol (FARXIGA) 10 MG TABS tablet [id number]  Take 1 tablet (10 mg total) by mouth daily. Setzer, Lynnell Jude, PA-C  Active   dexamethasone (DECADRON) 4 MG tablet [id number] No Take 2 tablets (8 mg total) by mouth daily. Start the day after chemotherapy for 2 days. Take with food. Rickard Patience, MD Past Month Active Pharmacy Records  diclofenac Sodium (VOLTAREN) 1 % GEL [id number]  Apply 4 g topically 4 (four) times daily. Setzer, Lynnell Jude, PA-C  Active   gabapentin (NEURONTIN) 100 MG capsule [id number]  Take 2 capsules (200 mg total) by mouth 2 (two) times daily. Setzer, Lynnell Jude, PA-C  Active   hydrALAZINE (APRESOLINE) 25 MG tablet [id number]  Take 1 tablet (25 mg total) by mouth every 8 (eight) hours. Setzer, Lynnell Jude, PA-C  Active   lamoTRIgine (LAMICTAL) 25 MG tablet [id number]  Take one tablet twice a day for 14 days, then one tablet each morning and two tablets at night for 7 days, then two tablets in the morning and 3 tablets at night for 7 days, then as directed by neurologist Valetta Fuller, Lynnell Jude, PA-C  Active   ondansetron (ZOFRAN-ODT) 8 MG disintegrating tablet [id number] No Take 1 tablet (8 mg total) by mouth every 8 (eight) hours as needed for nausea or vomiting. Rickard Patience, MD prn unk Active Pharmacy Records  oxyCODONE (OXY IR/ROXICODONE) 5 MG immediate release tablet [id number] No Take 5 mg by mouth every 4 (four) hours as needed for severe pain. [provider] prn unk Active Pharmacy Records  pantoprazole (PROTONIX) 40 MG tablet [id number] No Take 1 tablet (  40 mg total) by mouth 2 (two) times daily before a meal. Rickard Patience, MD Past Week Active Pharmacy Records  prochlorperazine (COMPAZINE) 10 MG tablet [id number] No Take 1 tablet (10 mg total) by mouth every 6 (six) hours as needed for nausea or vomiting. Rickard Patience, MD prn unk Active Pharmacy Records  scopolamine (TRANSDERM-SCOP) 1 MG/3DAYS [id number] No Place 1  patch (1.5 mg total) onto the skin every 3 (three) days. Sunnie Nielsen, DO unk Active Pharmacy Records  sertraline (ZOLOFT) 50 MG tablet [id number] No Take 1 tablet (50 mg total) by mouth daily. Jacky Kindle, FNP Past Week Active Pharmacy Records  torsemide [facility]) 10 MG tablet [id number]  Take 1 tablet (10 mg total) by mouth daily as needed (lower ext swelling). Setzer, Lynnell Jude, PA-C  Active             Home Care and Equipment/Supplies: Were Home Health Services Ordered?: Yes Name of Home Health Agency:: unknown Has Agency set up a time to come to your home?: Yes First Home Health Visit Date: 12/09/22 Any new equipment or medical supplies ordered?: NA  Functional Questionnaire: Do you need assistance with bathing/showering or dressing?: No Do you need assistance with meal preparation?: No Do you need assistance with eating?: No Do you have difficulty maintaining continence: No Do you need assistance with getting out of bed/getting out of a chair/moving?: No Do you have difficulty managing or taking your medications?: No  Follow up appointments reviewed: PCP Follow-up appointment confirmed?: Yes Date of PCP follow-up appointment?: 12/17/22 Follow-up Provider: [facility] Follow-up appointment confirmed?: NA Do you need transportation to your follow-up appointment?: No Do you understand care options if your condition(s) worsen?: Yes-patient verbalized understanding    SIGNATURE Karena Addison, LPN [facility] Nurse Health Advisor Direct Dial [phone]

## 2022-12-10 ENCOUNTER — Inpatient Hospital Stay: Payer: Medicaid Other | Attending: Oncology

## 2022-12-10 ENCOUNTER — Ambulatory Visit: Payer: Medicaid Other | Admitting: Oncology

## 2022-12-10 ENCOUNTER — Encounter: Payer: Self-pay | Admitting: Oncology

## 2022-12-10 ENCOUNTER — Inpatient Hospital Stay (HOSPITAL_BASED_OUTPATIENT_CLINIC_OR_DEPARTMENT_OTHER): Payer: Medicaid Other | Admitting: Oncology

## 2022-12-10 ENCOUNTER — Inpatient Hospital Stay: Payer: Medicaid Other

## 2022-12-10 ENCOUNTER — Ambulatory Visit: Payer: Medicaid Other

## 2022-12-10 ENCOUNTER — Other Ambulatory Visit: Payer: Self-pay

## 2022-12-10 ENCOUNTER — Other Ambulatory Visit: Payer: Medicaid Other

## 2022-12-10 VITALS — BP 136/78 | HR 90 | Temp 96.4°F | Resp 18 | Wt 243.7 lb

## 2022-12-10 DIAGNOSIS — Z23 Encounter for immunization: Secondary | ICD-10-CM | POA: Insufficient documentation

## 2022-12-10 DIAGNOSIS — D509 Iron deficiency anemia, unspecified: Secondary | ICD-10-CM

## 2022-12-10 DIAGNOSIS — G620 Drug-induced polyneuropathy: Secondary | ICD-10-CM | POA: Diagnosis not present

## 2022-12-10 DIAGNOSIS — K7469 Other cirrhosis of liver: Secondary | ICD-10-CM | POA: Diagnosis not present

## 2022-12-10 DIAGNOSIS — Z5111 Encounter for antineoplastic chemotherapy: Secondary | ICD-10-CM | POA: Insufficient documentation

## 2022-12-10 DIAGNOSIS — I639 Cerebral infarction, unspecified: Secondary | ICD-10-CM | POA: Diagnosis not present

## 2022-12-10 DIAGNOSIS — C160 Malignant neoplasm of cardia: Secondary | ICD-10-CM | POA: Insufficient documentation

## 2022-12-10 DIAGNOSIS — C786 Secondary malignant neoplasm of retroperitoneum and peritoneum: Secondary | ICD-10-CM | POA: Insufficient documentation

## 2022-12-10 DIAGNOSIS — Z79899 Other long term (current) drug therapy: Secondary | ICD-10-CM | POA: Diagnosis not present

## 2022-12-10 DIAGNOSIS — T451X5A Adverse effect of antineoplastic and immunosuppressive drugs, initial encounter: Secondary | ICD-10-CM

## 2022-12-10 DIAGNOSIS — C778 Secondary and unspecified malignant neoplasm of lymph nodes of multiple regions: Secondary | ICD-10-CM | POA: Insufficient documentation

## 2022-12-10 DIAGNOSIS — F411 Generalized anxiety disorder: Secondary | ICD-10-CM | POA: Diagnosis not present

## 2022-12-10 LAB — CMP (CANCER CENTER ONLY)
ALT: 22 U/L (ref 0–44)
AST: 27 U/L (ref 15–41)
Albumin: 3.1 g/dL — ABNORMAL LOW (ref 3.5–5.0)
Alkaline Phosphatase: 103 U/L (ref 38–126)
Anion gap: 8 (ref 5–15)
BUN: 34 mg/dL — ABNORMAL HIGH (ref 6–20)
CO2: 23 mmol/L (ref 22–32)
Calcium: 8.4 mg/dL — ABNORMAL LOW (ref 8.9–10.3)
Chloride: 106 mmol/L (ref 98–111)
Creatinine: 1.43 mg/dL — ABNORMAL HIGH (ref 0.61–1.24)
GFR, Estimated: 59 mL/min — ABNORMAL LOW (ref >60)
Glucose, Bld: 189 mg/dL — ABNORMAL HIGH (ref 70–99)
Potassium: 3.7 mmol/L (ref 3.5–5.1)
Sodium: 137 mmol/L (ref 135–145)
Total Bilirubin: 0.4 mg/dL (ref 0.3–1.2)
Total Protein: 7.2 g/dL (ref 6.5–8.1)

## 2022-12-10 LAB — CBC WITH DIFFERENTIAL (CANCER CENTER ONLY)
Abs Immature Granulocytes: 0.03 10*3/uL (ref 0.00–0.07)
Basophils Absolute: 0.1 10*3/uL (ref 0.0–0.1)
Basophils Relative: 1 %
Eosinophils Absolute: 0.3 10*3/uL (ref 0.0–0.5)
Eosinophils Relative: 4 %
HCT: 36 % — ABNORMAL LOW (ref 39.0–52.0)
Hemoglobin: 11 g/dL — ABNORMAL LOW (ref 13.0–17.0)
Immature Granulocytes: 0 %
Lymphocytes Relative: 16 %
Lymphs Abs: 1.3 10*3/uL (ref 0.7–4.0)
MCH: 26.3 pg (ref 26.0–34.0)
MCHC: 30.6 g/dL (ref 30.0–36.0)
MCV: 86.1 fL (ref 80.0–100.0)
Monocytes Absolute: 0.6 10*3/uL (ref 0.1–1.0)
Monocytes Relative: 7 %
Neutro Abs: 6 10*3/uL (ref 1.7–7.7)
Neutrophils Relative %: 72 %
Platelet Count: 353 10*3/uL (ref 150–400)
RBC: 4.18 MIL/uL — ABNORMAL LOW (ref 4.22–5.81)
RDW: 20.3 % — ABNORMAL HIGH (ref 11.5–15.5)
WBC Count: 8.3 10*3/uL (ref 4.0–10.5)
nRBC: 0 % (ref 0.0–0.2)

## 2022-12-10 LAB — IRON AND TIBC
Iron: 57 ug/dL (ref 45–182)
Saturation Ratios: 25 % (ref 17.9–39.5)
TIBC: 224 ug/dL — ABNORMAL LOW (ref 250–450)
UIBC: 167 ug/dL

## 2022-12-10 LAB — SAMPLE TO BLOOD BANK

## 2022-12-10 LAB — RETIC PANEL
Immature Retic Fract: 20 % — ABNORMAL HIGH (ref 2.3–15.9)
RBC.: 4.12 MIL/uL — ABNORMAL LOW (ref 4.22–5.81)
Retic Count, Absolute: 92.7 10*3/uL (ref 19.0–186.0)
Retic Ct Pct: 2.3 % (ref 0.4–3.1)
Reticulocyte Hemoglobin: 30.4 pg (ref >27.9)

## 2022-12-10 LAB — FERRITIN: Ferritin: 229 ng/mL (ref 24–336)

## 2022-12-10 MED ORDER — PALONOSETRON HCL INJECTION 0.25 MG/5ML
0.2500 mg | Freq: Once | INTRAVENOUS | Status: AC
  Administered 2022-12-10: 0.25 mg via INTRAVENOUS
  Filled 2022-12-10: qty 5

## 2022-12-10 MED ORDER — DEXTROSE 5 % IV SOLN
Freq: Once | INTRAVENOUS | Status: AC
  Filled 2022-12-10: qty 250

## 2022-12-10 MED ORDER — SODIUM CHLORIDE 0.9 % IV SOLN
10.0000 mg | Freq: Once | INTRAVENOUS | Status: AC
  Administered 2022-12-10: 10 mg via INTRAVENOUS
  Filled 2022-12-10: qty 10

## 2022-12-10 MED ORDER — SODIUM CHLORIDE 0.9 % IV SOLN
2400.0000 mg/m2 | INTRAVENOUS | Status: DC
  Administered 2022-12-10: 5550 mg via INTRAVENOUS
  Filled 2022-12-10: qty 111

## 2022-12-10 MED ORDER — LEUCOVORIN CALCIUM INJECTION 350 MG
400.0000 mg/m2 | Freq: Once | INTRAVENOUS | Status: AC
  Administered 2022-12-10: 928 mg via INTRAVENOUS
  Filled 2022-12-10: qty 46.4

## 2022-12-10 MED ORDER — APIXABAN 2.5 MG PO TABS: 2.5000 mg | ORAL_TABLET | Freq: Two times a day (BID) | ORAL | 5 refills | Status: DC

## 2022-12-10 MED ORDER — INFLUENZA VIRUS VACC SPLIT PF (FLUZONE) 0.5 ML IM SUSY
0.5000 mL | PREFILLED_SYRINGE | Freq: Once | INTRAMUSCULAR | Status: AC
  Administered 2022-12-10: 0.5 mL via INTRAMUSCULAR
  Filled 2022-12-10: qty 0.5

## 2022-12-10 MED ORDER — OXALIPLATIN CHEMO INJECTION 100 MG/20ML
65.0000 mg/m2 | Freq: Once | INTRAVENOUS | Status: AC
  Administered 2022-12-10: 150 mg via INTRAVENOUS
  Filled 2022-12-10: qty 30

## 2022-12-10 NOTE — Assessment & Plan Note (Signed)
Recommend Ativan 0.5 mg every 12 hours as needed for anxiety/sleep/nausea 

## 2022-12-10 NOTE — Assessment & Plan Note (Addendum)
pt has hx of stroke, now has left-sided numbness. MRI of the brain with and without contrast showed subacute infarct in the left frontal operculum with petechial hemorrhage and mild surrounding edema. No significant mass effect or midline shift. MRA negative foir LVO. The MRI findings of left frontal operculum does not seem to explain his left-sided numbness. Neurologist during admission recommend PET brain or follow up with another MRI. 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 Barbaraann Cao for evaluation and management.  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

## 2022-12-10 NOTE — Assessment & Plan Note (Signed)
LFT is stable.

## 2022-12-10 NOTE — Assessment & Plan Note (Signed)
Recommend patient to take gabapentin 200mg  BID.

## 2022-12-10 NOTE — Assessment & Plan Note (Addendum)
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  PD-L1 TPS<1%, CPS 3, TMB 8.4, MSI can not be accessed. Labs are reviewed and discussed with patient. Proceed with 5-FU +Oxaliplatin 65mg /m2  Repeat PET scan at the end of September 2024-after 6 cycles of treatment.

## 2022-12-10 NOTE — Patient Instructions (Signed)
[facility]  Discharge Instructions: Thank you for choosing [facility] to provide your oncology and hematology care.  If you have a lab appointment with the [facility], please go directly to the [facility] and check in at the registration area.  Wear comfortable clothing and clothing appropriate for easy access to any Portacath or PICC line.   We strive to give you quality time with your provider. You may need to reschedule your appointment if you arrive late (15 or more minutes).  Arriving late affects you and other patients whose appointments are after yours.  Also, if you miss three or more appointments without notifying the office, you may be dismissed from the clinic at the provider's discretion.      For prescription refill requests, have your pharmacy contact our office and allow 72 hours for refills to be completed.    Today you received the following chemotherapy and/or immunotherapy agents OXALIPLATIN, LEUCOVORIN, 5 Fu      To help prevent nausea and vomiting after your treatment, we encourage you to take your nausea medication as directed.  BELOW ARE SYMPTOMS THAT SHOULD BE REPORTED IMMEDIATELY: *FEVER GREATER THAN 100.4 F (38 C) OR HIGHER *CHILLS OR SWEATING *NAUSEA AND VOMITING THAT IS NOT CONTROLLED WITH YOUR NAUSEA MEDICATION *UNUSUAL SHORTNESS OF BREATH *UNUSUAL BRUISING OR BLEEDING *URINARY PROBLEMS (pain or burning when urinating, or frequent urination) *BOWEL PROBLEMS (unusual diarrhea, constipation, pain near the anus) TENDERNESS IN MOUTH AND THROAT WITH OR WITHOUT PRESENCE OF ULCERS (sore throat, sores in mouth, or a toothache) UNUSUAL RASH, SWELLING OR PAIN  UNUSUAL VAGINAL DISCHARGE OR ITCHING   Items with * indicate a potential emergency and should be followed up as soon as possible or go to the Emergency Department if any problems should occur.  Please show the CHEMOTHERAPY ALERT CARD or IMMUNOTHERAPY ALERT  CARD at check-in to the Emergency Department and triage nurse.  Should you have questions after your visit or need to cancel or reschedule your appointment, please contact [facility]  [phone] and follow the prompts.  Office hours are 8:00 a.m. to 4:30 p.m. Monday - Friday. Please note that voicemails left after 4:00 p.m. may not be returned until the following business day.  We are closed weekends and major holidays. You have access to a nurse at all times for urgent questions. Please call the main number to the clinic [phone] and follow the prompts.  For any non-urgent questions, you may also contact your provider using MyChart. We now offer e-Visits for anyone 57 and older to request care online for non-urgent symptoms. For details visit [email or web address].   Also download the MyChart app! Go to the app store, search "MyChart", open the app, select [facility], and log in with your MyChart username and password.  Oxaliplatin Injection What is this medication? OXALIPLATIN (ox AL i PLA tin) treats colorectal cancer. It works by slowing down the growth of cancer cells. This medicine may be used for other purposes; ask your health care provider or pharmacist if you have questions. COMMON BRAND NAME(S): Eloxatin What should I tell my care team before I take this medication? They need to know if you have any of these conditions: Heart disease History of irregular heartbeat or rhythm Liver disease Low blood cell levels (white cells, red cells, and platelets) Lung or breathing disease, such as asthma Take medications that treat or prevent blood clots Tingling of the fingers, toes,  or other nerve disorder An unusual or allergic reaction to oxaliplatin, other medications, foods, dyes, or preservatives If you or your partner are pregnant or trying to get pregnant Breast-feeding How should I use this medication? This medication is injected into a  vein. It is given by your care team in a hospital or clinic setting. Talk to your care team about the use of this medication in children. Special care may be needed. Overdosage: If you think you have taken too much of this medicine contact a poison control [facility] or emergency room at once. NOTE: This medicine is only for you. Do not share this medicine with others. What if I miss a dose? Keep appointments for follow-up doses. It is important not to miss a dose. Call your care team if you are unable to keep an appointment. What may interact with this medication? Do not take this medication with any of the following: Cisapride Dronedarone Pimozide Thioridazine This medication may also interact with the following: Aspirin and aspirin-like medications Certain medications that treat or prevent blood clots, such as warfarin, apixaban, dabigatran, and rivaroxaban Cisplatin Cyclosporine Diuretics Medications for infection, such as acyclovir, adefovir, amphotericin B, bacitracin, cidofovir, foscarnet, ganciclovir, gentamicin, pentamidine, vancomycin NSAIDs, medications for pain and inflammation, such as ibuprofen or naproxen Other medications that cause heart rhythm changes Pamidronate Zoledronic acid This list may not describe all possible interactions. Give your health care provider a list of all the medicines, herbs, non-prescription drugs, or dietary supplements you use. Also tell them if you smoke, drink alcohol, or use illegal drugs. Some items may interact with your medicine. What should I watch for while using this medication? Your condition will be monitored carefully while you are receiving this medication. You may need blood work while taking this medication. This medication may make you feel generally unwell. This is not uncommon as chemotherapy can affect healthy cells as well as cancer cells. Report any side effects. Continue your course of treatment even though you feel ill unless  your care team tells you to stop. This medication may increase your risk of getting an infection. Call your care team for advice if you get a fever, chills, sore throat, or other symptoms of a cold or flu. Do not treat yourself. Try to avoid being around people who are sick. Avoid taking medications that contain aspirin, acetaminophen, ibuprofen, naproxen, or ketoprofen unless instructed by your care team. These medications may hide a fever. Be careful brushing or flossing your teeth or using a toothpick because you may get an infection or bleed more easily. If you have any dental work done, tell your dentist you are receiving this medication. This medication can make you more sensitive to cold. Do not drink cold drinks or use ice. Cover exposed skin before coming in contact with cold temperatures or cold objects. When out in cold weather wear warm clothing and cover your mouth and nose to warm the air that goes into your lungs. Tell your care team if you get sensitive to the cold. Talk to your care team if you or your partner are pregnant or think either of you might be pregnant. This medication can cause serious birth defects if taken during pregnancy and for 9 months after the last dose. A negative pregnancy test is required before starting this medication. A reliable form of contraception is recommended while taking this medication and for 9 months after the last dose. Talk to your care team about effective forms of contraception. Do not father  a child while taking this medication and for 6 months after the last dose. Use a condom while having sex during this time period. Do not breastfeed while taking this medication and for 3 months after the last dose. This medication may cause infertility. Talk to your care team if you are concerned about your fertility. What side effects may I notice from receiving this medication? Side effects that you should report to your care team as soon as possible: Allergic  reactions--skin rash, itching, hives, swelling of the face, lips, tongue, or throat Bleeding--bloody or black, tar-like stools, vomiting blood or brown material that looks like coffee grounds, red or dark brown urine, small red or purple spots on skin, unusual bruising or bleeding Dry cough, shortness of breath or trouble breathing Heart rhythm changes--fast or irregular heartbeat, dizziness, feeling faint or lightheaded, chest pain, trouble breathing Infection--fever, chills, cough, sore throat, wounds that don't heal, pain or trouble when passing urine, general feeling of discomfort or being unwell Liver injury--right upper belly pain, loss of appetite, nausea, light-colored stool, dark yellow or brown urine, yellowing skin or eyes, unusual weakness or fatigue Low red blood cell level--unusual weakness or fatigue, dizziness, headache, trouble breathing Muscle injury--unusual weakness or fatigue, muscle pain, dark yellow or brown urine, decrease in amount of urine Pain, tingling, or numbness in the hands or feet Sudden and severe headache, confusion, change in vision, seizures, which may be signs of posterior reversible encephalopathy syndrome (PRES) Unusual bruising or bleeding Side effects that usually do not require medical attention (report to your care team if they continue or are bothersome): Diarrhea Nausea Pain, redness, or swelling with sores inside the mouth or throat Unusual weakness or fatigue Vomiting This list may not describe all possible side effects. Call your doctor for medical advice about side effects. You may report side effects to FDA at 1-800-FDA-1088. Where should I keep my medication? This medication is given in a hospital or clinic. It will not be stored at home. NOTE: This sheet is a summary. It may not cover all possible information. If you have questions about this medicine, talk to your doctor, pharmacist, or health care provider.  2024 Elsevier/Gold Standard  (2022-01-01 00:00:00)  Leucovorin Injection What is this medication? LEUCOVORIN (loo koe VOR in) prevents side effects from certain medications, such as methotrexate. It works by increasing folate levels. This helps protect healthy cells in your body. It may also be used to treat anemia caused by low levels of folate. It can also be used with fluorouracil, a type of chemotherapy, to treat colorectal cancer. It works by increasing the effects of fluorouracil in the body. This medicine may be used for other purposes; ask your health care provider or pharmacist if you have questions. What should I tell my care team before I take this medication? They need to know if you have any of these conditions: Anemia from low levels of vitamin B12 in the blood An unusual or allergic reaction to leucovorin, folic acid, other medications, foods, dyes, or preservatives Pregnant or trying to get pregnant Breastfeeding How should I use this medication? This medication is injected into a vein or a muscle. It is given by your care team in a hospital or clinic setting. Talk to your care team about the use of this medication in children. Special care may be needed. Overdosage: If you think you have taken too much of this medicine contact a poison control [facility] or emergency room at once. NOTE: This medicine is  only for you. Do not share this medicine with others. What if I miss a dose? Keep appointments for follow-up doses. It is important not to miss your dose. Call your care team if you are unable to keep an appointment. What may interact with this medication? Capecitabine Fluorouracil Phenobarbital Phenytoin Primidone Trimethoprim;sulfamethoxazole This list may not describe all possible interactions. Give your health care provider a list of all the medicines, herbs, non-prescription drugs, or dietary supplements you use. Also tell them if you smoke, drink alcohol, or use illegal drugs. Some items may interact  with your medicine. What should I watch for while using this medication? Your condition will be monitored carefully while you are receiving this medication. This medication may increase the side effects of 5-fluorouracil. Tell your care team if you have diarrhea or mouth sores that do not get better or that get worse. What side effects may I notice from receiving this medication? Side effects that you should report to your care team as soon as possible: Allergic reactions--skin rash, itching, hives, swelling of the face, lips, tongue, or throat This list may not describe all possible side effects. Call your doctor for medical advice about side effects. You may report side effects to FDA at 1-800-FDA-1088. Where should I keep my medication? This medication is given in a hospital or clinic. It will not be stored at home. NOTE: This sheet is a summary. It may not cover all possible information. If you have questions about this medicine, talk to your doctor, pharmacist, or health care provider.  2024 Elsevier/Gold Standard (2021-08-21 00:00:00)  Fluorouracil Injection What is this medication? FLUOROURACIL (flure oh YOOR a sil) treats some types of cancer. It works by slowing down the growth of cancer cells. This medicine may be used for other purposes; ask your health care provider or pharmacist if you have questions. COMMON BRAND NAME(S): Adrucil What should I tell my care team before I take this medication? They need to know if you have any of these conditions: Blood disorders Dihydropyrimidine dehydrogenase (DPD) deficiency Infection, such as chickenpox, cold sores, herpes Kidney disease Liver disease Poor nutrition Recent or ongoing radiation therapy An unusual or allergic reaction to fluorouracil, other medications, foods, dyes, or preservatives If you or your partner are pregnant or trying to get pregnant Breast-feeding How should I use this medication? This medication is injected  into a vein. It is administered by your care team in a hospital or clinic setting. Talk to your care team about the use of this medication in children. Special care may be needed. Overdosage: If you think you have taken too much of this medicine contact a poison control [facility] or emergency room at once. NOTE: This medicine is only for you. Do not share this medicine with others. What if I miss a dose? Keep appointments for follow-up doses. It is important not to miss your dose. Call your care team if you are unable to keep an appointment. What may interact with this medication? Do not take this medication with any of the following: Live virus vaccines This medication may also interact with the following: Medications that treat or prevent blood clots, such as warfarin, enoxaparin, dalteparin This list may not describe all possible interactions. Give your health care provider a list of all the medicines, herbs, non-prescription drugs, or dietary supplements you use. Also tell them if you smoke, drink alcohol, or use illegal drugs. Some items may interact with your medicine. What should I watch for while using this  medication? Your condition will be monitored carefully while you are receiving this medication. This medication may make you feel generally unwell. This is not uncommon as chemotherapy can affect healthy cells as well as cancer cells. Report any side effects. Continue your course of treatment even though you feel ill unless your care team tells you to stop. In some cases, you may be given additional medications to help with side effects. Follow all directions for their use. This medication may increase your risk of getting an infection. Call your care team for advice if you get a fever, chills, sore throat, or other symptoms of a cold or flu. Do not treat yourself. Try to avoid being around people who are sick. This medication may increase your risk to bruise or bleed. Call your care team if  you notice any unusual bleeding. Be careful brushing or flossing your teeth or using a toothpick because you may get an infection or bleed more easily. If you have any dental work done, tell your dentist you are receiving this medication. Avoid taking medications that contain aspirin, acetaminophen, ibuprofen, naproxen, or ketoprofen unless instructed by your care team. These medications may hide a fever. Do not treat diarrhea with over the counter products. Contact your care team if you have diarrhea that lasts more than 2 days or if it is severe and watery. This medication can make you more sensitive to the sun. Keep out of the sun. If you cannot avoid being in the sun, wear protective clothing and sunscreen. Do not use sun lamps, tanning beds, or tanning booths. Talk to your care team if you or your partner wish to become pregnant or think you might be pregnant. This medication can cause serious birth defects if taken during pregnancy and for 3 months after the last dose. A reliable form of contraception is recommended while taking this medication and for 3 months after the last dose. Talk to your care team about effective forms of contraception. Do not father a child while taking this medication and for 3 months after the last dose. Use a condom while having sex during this time period. Do not breastfeed while taking this medication. This medication may cause infertility. Talk to your care team if you are concerned about your fertility. What side effects may I notice from receiving this medication? Side effects that you should report to your care team as soon as possible: Allergic reactions--skin rash, itching, hives, swelling of the face, lips, tongue, or throat Heart attack--pain or tightness in the chest, shoulders, arms, or jaw, nausea, shortness of breath, cold or clammy skin, feeling faint or lightheaded Heart failure--shortness of breath, swelling of the ankles, feet, or hands, sudden weight  gain, unusual weakness or fatigue Heart rhythm changes--fast or irregular heartbeat, dizziness, feeling faint or lightheaded, chest pain, trouble breathing High ammonia level--unusual weakness or fatigue, confusion, loss of appetite, nausea, vomiting, seizures Infection--fever, chills, cough, sore throat, wounds that don't heal, pain or trouble when passing urine, general feeling of discomfort or being unwell Low red blood cell level--unusual weakness or fatigue, dizziness, headache, trouble breathing Pain, tingling, or numbness in the hands or feet, muscle weakness, change in vision, confusion or trouble speaking, loss of balance or coordination, trouble walking, seizures Redness, swelling, and blistering of the skin over hands and feet Severe or prolonged diarrhea Unusual bruising or bleeding Side effects that usually do not require medical attention (report to your care team if they continue or are bothersome): Dry skin Headache Increased  tears Nausea Pain, redness, or swelling with sores inside the mouth or throat Sensitivity to light Vomiting This list may not describe all possible side effects. Call your doctor for medical advice about side effects. You may report side effects to FDA at 1-800-FDA-1088. Where should I keep my medication? This medication is given in a hospital or clinic. It will not be stored at home. NOTE: This sheet is a summary. It may not cover all possible information. If you have questions about this medicine, talk to your doctor, pharmacist, or health care provider.  2024 Elsevier/Gold Standard (2021-07-24 00:00:00)

## 2022-12-10 NOTE — Progress Notes (Signed)
Hematology/Oncology Progress note [phone] [phone]        REFERRING PROVIDER: Debera Lat, PA-C    CHIEF COMPLAINTS/PURPOSE OF CONSULTATION:  GE junction cancer/gastric cardia cancer.  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  PD-L1 TPS<1%, CPS 3, TMB 8.4, MSI can not be accessed. Labs are reviewed and discussed with patient. Proceed with 5-FU +Oxaliplatin 65mg /m2  Repeat PET scan at the end of September 2024-after 6 cycles of treatment.  Other cirrhosis of liver (HCC) LFT is stable.   IDA (iron deficiency anemia) Lab Results  Component Value Date   HGB 11.0 (L) 12/10/2022   TIBC 244 (L) 11/12/2022   IRONPCTSAT 16 (L) 11/12/2022   FERRITIN 214 11/12/2022    Close monitor.   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 MRI of the brain with and without contrast showed subacute infarct in the left frontal operculum with petechial hemorrhage and mild surrounding edema. No significant mass effect or midline shift. MRA negative foir LVO. The MRI findings of left frontal operculum does not seem to explain his left-sided numbness. Neurologist during admission recommend PET brain or follow up with another MRI. 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 Barbaraann Cao for evaluation and management.  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   Chemotherapy-induced neuropathy (HCC) Recommend patient to take gabapentin 200mg   BID.    Orders Placed This Encounter  Procedures   NM PET Image Restag (PS) Skull Base To Thigh    Standing Status:   Future    Standing Expiration Date:   12/10/2023    Order Specific Question:   If indicated for the ordered procedure, I authorize the administration of a radiopharmaceutical per Radiology protocol    Answer:   Yes    Order Specific Question:   Preferred imaging location?    Answer:   [facility]   Ferritin    Standing Status:   Future    Number of Occurrences:   1    Standing Expiration Date:   12/10/2023   Retic Panel    Standing Status:   Future    Number of Occurrences:   1    Standing Expiration Date:   12/10/2023   Iron and TIBC    Standing Status:   Future    Number of Occurrences:   1    Standing Expiration Date:   12/10/2023   Amb Referral to Neuro Oncology    Referral Priority:   Routine    Referral Type:   Consultation    Referral Reason:   Specialty Services Required    Number of Visits Requested:   1    2 weeks lab MD FOLFOX  All questions were answered. The patient knows to call the clinic with any problems, questions or concerns.  Rickard Patience, MD, PhD [facility] Hematology Oncology 12/10/2022    HISTORY OF PRESENTING ILLNESS:  Jeremiah Vance 52 y.o. male presents to establish care for GE junction/gastric cardia adenocarcinoma I have reviewed his chart and materials related  to his cancer extensively and collaborated history with the patient. Summary of oncologic history is as follows: Oncology History  Gastric cancer (HCC)  09/08/2022 Imaging   CT abdomen pelvis w contrast     09/08/2022 Imaging   CT abdomen pelvis with contrast  Diffuse masslike thickening in the area of the GE junction and upper stomach with the extensive abnormal lymph nodes throughout the abdomen including retrocrural, porta hepatis, retroperitoneum and lesser and greater curve of the stomach. In addition there are areas of suggest peritoneal carcinomatosis. Recommend  further evaluation.    No bowel obstruction. Large midline anterior pelvic wall hernia involving small bowel and mesenteric fat with rectus muscle diastasis. Gallstones.   09/19/2022 Cancer Staging   Staging form: Stomach, AJCC 8th Edition - Clinical stage from 09/19/2022: Stage IVB (cTX, cN2, cM1) - Signed by Rickard Patience, MD on 09/19/2022 Stage prefix: Initial diagnosis   09/19/2022 Initial Diagnosis   Gastric cancer (HCC)  09/08/2022, patient presented emergency room for evaluation of abdominal pain and vomiting.  He has also had weight loss.  Dysphagia with solid food for couple of months.  CT abdomen pelvis showed diffuse masslike thickening of the GE junction and upper stomach with extensive abnormal lymph nodes throughout abdomen.  09/10/2022 upper endoscopy showed malignant appearance gastric tumor at the GE junction and in the cardia Biopsied.  Nodular mucosa in the second portion of duodenum.  Biopsied. GE junction mass showed moderate to poorly differentiated adenocarcinoma undermining squamous mucosa with focal area of squamous atypia.  Cannot exclude adenosquamous carcinoma.  Intestinal metaplasia is not identified. Duodenum cold biopsy-reactive duodenitis.  Negative for dysplasia and malignancy. Stomach random biopsy negative for H. pylori, intestinal metaplasia, dysplasia and malignancy.  Stomach Cardia mass biopsy showed moderate to poorly differentiated adenocarcinoma.  Patient's case was discussed on tumor board on 09/19/2022.  Per Dr. Oneita Kras, nephrology findings of both GE junction mass and gastric cardia mass or similar. HER2 staining negative. PD-L1 TPS<1%, CPS 3, TMB 8.4, MSI can not be accessed.  KMT2D frame shift, GATA6 copy number gain.  Molecular testing is pending.     09/23/2022 Imaging   PET scan showed 1. 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 2. Enlarged cirrhotic liver. 3. Cholelithiasis. 4. Bilateral renal  stones. 5. Umbilical hernias contain unobstructed small bowel and/or fat.  6. Aortic atherosclerosis (ICD10-I70.0). Coronary artery calcification. 7. Enlarged pulmonic trunk, indicative of pulmonary arterial hypertension.    10/02/2022 -  Chemotherapy   Patient is on Treatment Plan : GASTROESOPHAGEAL FOLFOX q14d     He lives at home with his sister.  Patient is undergoing neuro physical therapy. 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 A fib previously on Eliquis, which was held due to severe iron deficiency anemia.  Restarted in August 2024 due to subacute CVA.   INTERVAL HISTORY Selim D Moulds is a 52 y.o. male who has above history reviewed by me today presents for follow up visit for stage IV GE junction/gastric cancer.   11/21/2022 - 11/27/2022, patient was hospitalized.  He initially presented to emergency room due to left sided numbness.  MRI of the brain showed subacute infarct in the left frontal lobe with petechial hemorrhage and mild surrounding edema.  He has no right-sided neurologic deficits.  MRI finding was not explaining left-sided numbness.  He has chronic left sided weakness due to previous CVA.  Neurology was consulted and patient was started on  Eliquis for anticoagulation Eliquis 2.5 mg twice daily and lamotrigine for possible central pain syndrome.  Patient was discharged to rehab and was recently discharged. He reports feeling well.  Denies any blood in the stool since the start of anticoagulation.  Appetite is good.  Weight is stable.  He presents for evaluation prior to chemotherapy.  MEDICAL HISTORY:  Past Medical History:  Diagnosis Date   Acute ischemic stroke (HCC) 09/06/2017   Acute renal failure superimposed on stage 3a chronic kidney disease (HCC) 07/08/2019   Acute right-sided weakness    Allergies    Anasarca    Anemia 07/10/2017   Arthritis    Cancer (HCC)    stomach and esophageal cancer  stage 4   CHF (congestive heart failure) (HCC)    "coinsided w/kidney problems I was having 06/2017"   Chicken pox    CKD (chronic kidney disease) stage 3, GFR 30-59 ml/min (HCC) 06/2017   Depression    Elevated troponin 07/08/2019   High cholesterol    History of cardiomyopathy    LVEF 40 to 45% in April 2019 - subsequently normalized   Hyperbilirubinemia 07/10/2017   Hypertension    Ischemic stroke (HCC)    Small left internal capsule infarct due to lacunar disease   Morbid obesity (HCC)    Normocytic anemia 12/16/2017   Recurrent incisional hernia with incarceration s/p repair 10/22/2017 10/21/2017   Stroke (HCC) 08/2017   "right sided weakness since; getting stronger though" (10/22/2017)   Type 2 diabetes mellitus (HCC)     SURGICAL HISTORY: Past Surgical History:  Procedure Laterality Date   ABDOMINAL HERNIA REPAIR  2008; 10/22/2017   "scope; OPEN REPAIR INCARCERATED VENTRAL HERNIA   ESOPHAGOGASTRODUODENOSCOPY (EGD) WITH PROPOFOL N/A 09/10/2022   Procedure: ESOPHAGOGASTRODUODENOSCOPY (EGD) WITH PROPOFOL;  Surgeon: Toney Reil, MD;  Location: [facility] ENDOSCOPY;  Service: Gastroenterology;  Laterality: N/A;   ESOPHAGOGASTRODUODENOSCOPY (EGD) WITH PROPOFOL N/A 09/28/2022   Procedure: ESOPHAGOGASTRODUODENOSCOPY (EGD) WITH PROPOFOL;  Surgeon: Jaynie Collins, DO;  Location[facility] ENDOSCOPY;  Service: Gastroenterology;  Laterality: N/A;   HEMOSTASIS CONTROL  09/28/2022   Procedure: HEMOSTASIS CONTROL;  Surgeon: Jaynie Collins, DO;  Location: [facility] ENDOSCOPY;  Service: Gastroenterology;;   HERNIA REPAIR     KNEE ARTHROSCOPY Right 1989   PORTA CATH INSERTION N/A 09/25/2022   Procedure: PORTA CATH INSERTION;  Surgeon: Annice Needy, MD;  Location: [facility] INVASIVE CV LAB;  Service: Cardiovascular;  Laterality: N/A;   VENTRAL HERNIA REPAIR N/A 10/22/2017   Procedure: OPEN REPAIR INCARCERATED VENTRAL HERNIA;  Surgeon: Glenna Fellows, MD;  Location: [facility] OR;  Service: General;   Laterality: N/A;    SOCIAL HISTORY: Social History   Socioeconomic History   Marital status: Divorced    Spouse name: Not on file   Number of children: Not on file   Years of education: Not on file   Highest education level: Not on file  Occupational History   Not on file  Tobacco Use   Smoking status: Never   Smokeless tobacco: Never  Vaping Use   Vaping status: Never Used  Substance and Sexual Activity   Alcohol use: Yes    Comment: occasional beer   Drug use: Never   Sexual activity: Not Currently  Other Topics Concern   Not on file  Social History Narrative   He lives with his sister , [name] and she is his MPOA after his stokes   Social Determinants of Health   Financial Resource Strain: High Risk (03/20/2020)  Overall Financial Resource Strain (CARDIA)    Difficulty of Paying Living Expenses: Hard  Food Insecurity: No Food Insecurity (11/27/2022)   Hunger Vital Sign    Worried About Running Out of Food in the Last Year: Never true    Ran Out of Food in the Last Year: Never true  Transportation Needs: No Transportation Needs (11/27/2022)   PRAPARE - Administrator, Civil Service (Medical): No    Lack of Transportation (Non-Medical): No  Recent Concern: Transportation Needs - Unmet Transportation Needs (09/27/2022)   PRAPARE - Administrator, Civil Service (Medical): No    Lack of Transportation (Non-Medical): Yes  Physical Activity: Not on file  Stress: Not on file  Social Connections: Not on file  Intimate Partner Violence: Not At Risk (11/27/2022)   Humiliation, Afraid, Rape, and Kick questionnaire    Fear of Current or Ex-Partner: No    Emotionally Abused: No    Physically Abused: No    Sexually Abused: No    FAMILY HISTORY: Family History  Problem Relation Age of Onset   Diabetes Mother    Stroke Mother    Arthritis Mother    Depression Mother    Heart disease Mother    Hypertension Mother    Learning disabilities Mother     Mental illness Mother    Sleep apnea Mother    Diabetes Father    Heart disease Father    Arthritis Father    Hearing loss Father    Hyperlipidemia Father    Heart attack Father    Hypertension Father    Stroke Father    Diabetes Sister    Depression Sister    Diabetes Sister    Hypertension Sister    Mental illness Sister    Sleep apnea Sister    Diabetes Maternal Grandmother    Heart disease Maternal Grandmother    Depression Maternal Grandmother    Hyperlipidemia Maternal Grandmother    Hypertension Maternal Grandmother    Stroke Maternal Grandmother    Hypertension Maternal Grandfather    Hyperlipidemia Maternal Grandfather    Stroke Maternal Grandfather    Arthritis Paternal Grandmother    Hearing loss Paternal Grandmother    Stroke Paternal Grandfather    Heart disease Paternal Grandfather    Arthritis Paternal Grandfather    Heart attack Paternal Grandfather    Melanoma Other     ALLERGIES:  is allergic to pollen extract and sulfa antibiotics.  MEDICATIONS:  Current Outpatient Medications  Medication Sig Dispense Refill   acetaminophen (TYLENOL) 325 MG tablet Take 2 tablets (650 mg total) by mouth every 4 (four) hours as needed for mild pain (or temp > 37.5 C (99.5 F)).     atorvastatin (LIPITOR) 40 MG tablet Take 1 tablet (40 mg total) by mouth daily at 6 PM. 90 tablet 3   busPIRone (BUSPAR) 7.5 MG tablet Take 1 tablet (7.5 mg total) by mouth 2 (two) times daily. 180 tablet 1   Continuous Glucose Sensor (FREESTYLE LIBRE 2 SENSOR) MISC Place 1 sensor on the skin every 14 days. Use to check glucose continuously 2 each 11   dapagliflozin propanediol (FARXIGA) 10 MG TABS tablet Take 1 tablet (10 mg total) by mouth daily. 30 tablet 0   dexamethasone (DECADRON) 4 MG tablet Take 2 tablets (8 mg total) by mouth daily. Start the day after chemotherapy for 2 days. Take with food. 30 tablet 1   diclofenac Sodium (VOLTAREN) 1 % GEL Apply 4 g  topically 4 (four) times  daily.     gabapentin (NEURONTIN) 100 MG capsule Take 2 capsules (200 mg total) by mouth 2 (two) times daily. 120 capsule 0   hydrALAZINE (APRESOLINE) 25 MG tablet Take 1 tablet (25 mg total) by mouth every 8 (eight) hours. 90 tablet 0   lamoTRIgine (LAMICTAL) 25 MG tablet Take one tablet twice a day for 14 days, then one tablet each morning and two tablets at night for 7 days, then two tablets in the morning and 3 tablets at night for 7 days, then as directed by neurologist 84 tablet 0   ondansetron (ZOFRAN-ODT) 8 MG disintegrating tablet Take 1 tablet (8 mg total) by mouth every 8 (eight) hours as needed for nausea or vomiting. 60 tablet 1   oxyCODONE (OXY IR/ROXICODONE) 5 MG immediate release tablet Take 5 mg by mouth every 4 (four) hours as needed for severe pain.     pantoprazole (PROTONIX) 40 MG tablet Take 1 tablet (40 mg total) by mouth 2 (two) times daily before a meal. 60 tablet 3   prochlorperazine (COMPAZINE) 10 MG tablet Take 1 tablet (10 mg total) by mouth every 6 (six) hours as needed for nausea or vomiting. 60 tablet 0   scopolamine (TRANSDERM-SCOP) 1 MG/3DAYS Place 1 patch (1.5 mg total) onto the skin every 3 (three) days. 4 patch 0   sertraline (ZOLOFT) 50 MG tablet Take 1 tablet (50 mg total) by mouth daily. 30 tablet 3   torsemide (DEMADEX) 10 MG tablet Take 1 tablet (10 mg total) by mouth daily as needed (lower ext swelling). 30 tablet 0   apixaban (ELIQUIS) 2.5 MG TABS tablet Take 1 tablet (2.5 mg total) by mouth 2 (two) times daily. 60 tablet 5   No current facility-administered medications for this visit.   Facility-Administered Medications Ordered in Other Visits  Medication Dose Route Frequency Provider Last Rate Last Admin   fluorouracil (ADRUCIL) 5,550 mg in sodium chloride 0.9 % 139 mL chemo infusion  2,400 mg/m2 (Treatment Plan Recorded) Intravenous 1 day or 1 dose Rickard Patience, MD       leucovorin 928 mg in dextrose 5 % 250 mL infusion  400 mg/m2 (Treatment Plan Recorded)  Intravenous Once Rickard Patience, MD 148 mL/hr at 12/10/22 1324 928 mg at 12/10/22 1324   oxaliplatin (ELOXATIN) 150 mg in dextrose 5 % 500 mL chemo infusion  65 mg/m2 (Treatment Plan Recorded) Intravenous Once Rickard Patience, MD        Review of Systems  Constitutional:  Positive for fatigue and unexpected weight change. Negative for appetite change, chills and fever.  HENT:   Negative for hearing loss and voice change.   Eyes:  Negative for eye problems and icterus.  Respiratory:  Negative for chest tightness, cough and shortness of breath.   Cardiovascular:  Negative for chest pain and leg swelling.  Gastrointestinal:  Negative for abdominal distention, abdominal pain and nausea.  Endocrine: Negative for hot flashes.  Genitourinary:  Negative for difficulty urinating, dysuria and frequency.   Musculoskeletal:  Negative for arthralgias.  Skin:  Negative for itching and rash.  Neurological:  Positive for numbness. Negative for light-headedness.  Hematological:  Negative for adenopathy. Does not bruise/bleed easily.  Psychiatric/Behavioral:  Negative for confusion.      PHYSICAL EXAMINATION: ECOG PERFORMANCE STATUS: 2 - Symptomatic, <50% confined to bed  Vitals:   12/10/22 1117  BP: 136/78  Pulse: 90  Resp: 18  Temp: (!) 96.4 F (35.8 C)  SpO2: 97%  Filed Weights   12/10/22 1117  Weight: 243 lb 11.2 oz (110.5 kg)    Physical Exam Constitutional:      General: He is not in acute distress.    Appearance: He is obese. He is not diaphoretic.  HENT:     Head: Normocephalic and atraumatic.  Eyes:     General: No scleral icterus.    Pupils: Pupils are equal, round, and reactive to light.  Cardiovascular:     Rate and Rhythm: Normal rate.  Pulmonary:     Effort: Pulmonary effort is normal. No respiratory distress.  Abdominal:     General: There is no distension.     Palpations: Abdomen is soft.     Tenderness: There is no abdominal tenderness.  Musculoskeletal:        General:  Normal range of motion.     Cervical back: Normal range of motion and neck supple.     Right lower leg: No edema.     Left lower leg: Edema present.     Comments: Left foot swelling and tenderness  Skin:    General: Skin is warm and dry.  Neurological:     Mental Status: He is alert and oriented to person, place, and time. Mental status is at baseline.     Cranial Nerves: No cranial nerve deficit.     Motor: No abnormal muscle tone.  Psychiatric:        Mood and Affect: Mood and affect normal.      LABORATORY DATA:  I have reviewed the data as listed    Latest Ref Rng & Units 12/10/2022   11:00 AM 12/02/2022    6:47 AM 11/28/2022    4:31 AM  CBC  WBC 4.0 - 10.5 K/uL 8.3  5.8  5.6   Hemoglobin 13.0 - 17.0 g/dL 29.5  9.7  9.5   Hematocrit 39.0 - 52.0 % 36.0  32.2  30.4   Platelets 150 - 400 K/uL 353  439  343       Latest Ref Rng & Units 12/10/2022   11:00 AM 12/06/2022    6:59 AM 12/04/2022    7:09 AM  CMP  Glucose 70 - 99 mg/dL 284  132  99   BUN 6 - 20 mg/dL 34  28  26   Creatinine 0.61 - 1.24 mg/dL 4.40  1.02  7.25   Sodium 135 - 145 mmol/L 137  137  138   Potassium 3.5 - 5.1 mmol/L 3.7  4.4  4.2   Chloride 98 - 111 mmol/L 106  98  99   CO2 22 - 32 mmol/L 23  29  25    Calcium 8.9 - 10.3 mg/dL 8.4  8.5  8.5   Total Protein 6.5 - 8.1 g/dL 7.2     Total Bilirubin 0.3 - 1.2 mg/dL 0.4     Alkaline Phos 38 - 126 U/L 103     AST 15 - 41 U/L 27     ALT 0 - 44 U/L 22        RADIOGRAPHIC STUDIES: I have personally reviewed the radiological images as listed and agreed with the findings in the report. ECHOCARDIOGRAM COMPLETE  Result Date: 11/22/2022    ECHOCARDIOGRAM REPORT   Patient Name:   CHENEY FUGITT Date of Exam: 11/22/2022 Medical Rec #:  [id number]       Height:       70.0 in Accession #:    [id number]  Weight:       244.7 lb Date of Birth:  [birth year]       BSA:          2.274 m Patient Age:    51 years        BP:           154/84 mmHg Patient Gender: M                HR:           83 bpm. Exam Location:  [facility] Procedure: 2D Echo, Cardiac Doppler and Color Doppler Indications:     Stroke I63.9  History:         Patient has prior history of Echocardiogram examinations, most                  recent 11/23/2021. CHF, Stroke; Risk Factors:Hypertension.  Sonographer:     Cristela Blue Referring Phys:  [id number] Brien Few NIU Diagnosing Phys: Debbe Odea MD  Sonographer Comments: Suboptimal apical window. IMPRESSIONS  1. Left ventricular ejection fraction, by estimation, is 55 to 60%. The left ventricle has normal function. The left ventricle has no regional wall motion abnormalities. There is mild left ventricular hypertrophy. Left ventricular diastolic parameters are indeterminate.  2. Right ventricular systolic function is low normal. The right ventricular size is mildly enlarged. There is normal pulmonary artery systolic pressure.  3. Left atrial size was mildly dilated.  4. The mitral valve is normal in structure. Mild mitral valve regurgitation.  5. The aortic valve is tricuspid. Aortic valve regurgitation is not visualized.  6. Aortic dilatation noted. There is borderline dilatation of the aortic root, measuring 38 mm.  7. The inferior vena cava is dilated in size with <50% respiratory variability, suggesting right atrial pressure of 15 mmHg. FINDINGS  Left Ventricle: Left ventricular ejection fraction, by estimation, is 55 to 60%. The left ventricle has normal function. The left ventricle has no regional wall motion abnormalities. The left ventricular internal cavity size was normal in size. There is  mild left ventricular hypertrophy. Left ventricular diastolic parameters are indeterminate. Right Ventricle: The right ventricular size is mildly enlarged. No increase in right ventricular wall thickness. Right ventricular systolic function is low normal. There is normal pulmonary artery systolic pressure. The tricuspid regurgitant velocity is 1.75 m/s, and with an assumed right atrial  pressure of 15 mmHg, the estimated right ventricular systolic pressure is 27.2 mmHg. Left Atrium: Left atrial size was mildly dilated. Right Atrium: Right atrial size was normal in size. Pericardium: There is no evidence of pericardial effusion. Mitral Valve: The mitral valve is normal in structure. Mild mitral valve regurgitation. Tricuspid Valve: The tricuspid valve is normal in structure. Tricuspid valve regurgitation is not demonstrated. Aortic Valve: The aortic valve is tricuspid. Aortic valve regurgitation is not visualized. Aortic valve mean gradient measures 2.0 mmHg. Aortic valve peak gradient measures 3.2 mmHg. Aortic valve area, by VTI measures 3.12 cm. Pulmonic Valve: The pulmonic valve was normal in structure. Pulmonic valve regurgitation is not visualized. Aorta: Aortic dilatation noted. There is borderline dilatation of the aortic root, measuring 38 mm. Venous: The inferior vena cava is dilated in size with less than 50% respiratory variability, suggesting right atrial pressure of 15 mmHg. IAS/Shunts: No atrial level shunt detected by color flow Doppler.  LEFT VENTRICLE PLAX 2D LVIDd:         4.80 cm LVIDs:         3.30 cm LV PW:  2.00 cm LV IVS:        1.60 cm LVOT diam:     2.30 cm LV SV:         40 LV SV Index:   18 LVOT Area:     4.15 cm  RIGHT VENTRICLE RV Basal diam:  4.80 cm RV Mid diam:    3.80 cm RV S prime:     12.40 cm/s TAPSE (M-mode): 2.6 cm LEFT ATRIUM           Index        RIGHT ATRIUM           Index LA diam:      4.90 cm 2.15 cm/m   RA Area:     19.80 cm LA Vol (A2C): 61.8 ml 27.17 ml/m  RA Volume:   58.60 ml  25.77 ml/m LA Vol (A4C): 78.6 ml 34.56 ml/m  AORTIC VALVE AV Area (Vmax):    2.79 cm AV Area (Vmean):   2.93 cm AV Area (VTI):     3.12 cm AV Vmax:           89.05 cm/s AV Vmean:          58.200 cm/s AV VTI:            0.130 m AV Peak Grad:      3.2 mmHg AV Mean Grad:      2.0 mmHg LVOT Vmax:         59.90 cm/s LVOT Vmean:        41.000 cm/s LVOT VTI:           0.097 m LVOT/AV VTI ratio: 0.75  AORTA Ao Root diam: 3.80 cm MITRAL VALVE               TRICUSPID VALVE MV Area (PHT): 5.58 cm    TR Peak grad:   12.2 mmHg MV Decel Time: 136 msec    TR Vmax:        175.00 cm/s MV E velocity: 92.20 cm/s                            SHUNTS                            Systemic VTI:  0.10 m                            Systemic Diam: 2.30 cm Debbe Odea MD Electronically signed by Debbe Odea MD Signature Date/Time: 11/22/2022/12:49:58 PM    Final    US Venous Img Lower Unilateral Left (DVT)  Result Date: 11/22/2022 CLINICAL DATA:  Left lower extremity pain and swelling EXAM: LEFT LOWER EXTREMITY VENOUS DOPPLER ULTRASOUND TECHNIQUE: Gray-scale sonography with compression, as well as color and duplex ultrasound, were performed to evaluate the deep venous system(s) from the level of the common femoral vein through the popliteal and proximal calf veins. COMPARISON:  None Available. FINDINGS: VENOUS Normal compressibility of the common femoral, superficial femoral, and popliteal veins, as well as the visualized calf veins. Visualized portions of profunda femoral vein and great saphenous vein unremarkable. No filling defects to suggest DVT on grayscale or color Doppler imaging. Doppler waveforms show normal direction of venous flow, normal respiratory plasticity and response to augmentation. Limited views of the contralateral common femoral vein are unremarkable. OTHER None. Limitations: none IMPRESSION: Negative. Electronically  Signed   By: Malachy Moan M.D.   On: 11/22/2022 11:00   MR Brain W and Wo Contrast  Result Date: 11/21/2022 CLINICAL DATA:  Neuro deficit, acute, stroke suspected. Whole-body numbness, worse on left side. History of stroke with left-sided deficits. EXAM: MRI HEAD WITHOUT AND WITH CONTRAST MRA HEAD WITHOUT CONTRAST MRA NECK WITHOUT AND WITH CONTRAST TECHNIQUE: Multiplanar, multi-echo pulse sequences of the brain and surrounding structures were  acquired without and with intravenous contrast. Angiographic images of the Circle of Willis were acquired using MRA technique without intravenous contrast. Angiographic images of the neck were acquired using MRA technique without and with intravenous contrast. Carotid stenosis measurements (when applicable) are obtained utilizing NASCET criteria, using the distal internal carotid diameter as the denominator. 3D post processing, including maximum intensity projections and multiplanar reformations, was performed for better evaluation of the vasculature. CONTRAST:  10mL GADAVIST GADOBUTROL 1 MMOL/ML IV SOLN COMPARISON:  MRI brain 05/20/2022.  Head CT 11/21/2022. FINDINGS: MRI HEAD FINDINGS Brain: Subacute infarct in the left frontal operculum with petechial hemorrhage and mild surrounding edema. No significant mass effect or midline shift. Unchanged background of moderate chronic small-vessel disease with numerous perforator infarcts in the bilateral basal ganglia, thalami and cerebral white matter. Sequela of prior hemorrhage in the right external capsule/corona radiata. Multiple chronic microhemorrhages in the basal ganglia and pons. No hydrocephalus or extra-axial collection. No mass effect or midline shift. Vascular: Normal flow voids and vessel enhancement. Skull and upper cervical spine: Normal marrow signal and enhancement. Sinuses/Orbits: No acute or significant finding. Other: None. MRA HEAD FINDINGS Anterior circulation: Intracranial ICAs are patent without stenosis or aneurysm. The proximal ACAs and MCAs are patent without stenosis or aneurysm. Distal branches are symmetric. Posterior circulation: Visualized portions of the distal vertebral arteries and basilar artery are patent without stenosis or aneurysm. The SCAs, AICAs and PICAs are patent proximally. The PCAs are patent proximally without stenosis or aneurysm. Distal branches are symmetric. Anatomic variants: None. MRA NECK FINDINGS Aortic arch:  Three-vessel arch configuration. Arch vessel origins are patent. Right carotid system: No evidence of dissection, stenosis (50% or greater), or occlusion. Tortuosity of the right cervical ICA. Left carotid system: No evidence of dissection, stenosis (50% or greater), or occlusion. Tortuosity of the left cervical ICA. Vertebral arteries: Left dominant. No evidence of dissection, stenosis (50% or greater), or occlusion. The right vertebral artery functionally terminates in PICA. Other: None. IMPRESSION: 1. Subacute infarct in the left frontal operculum with petechial hemorrhage and mild surrounding edema. No significant mass effect or midline shift. 2. No large vessel occlusion, hemodynamically significant stenosis, or aneurysm in the head or neck. Electronically Signed   By: Orvan Falconer M.D.   On: 11/21/2022 18:19   MR ANGIO HEAD WO CONTRAST  Result Date: 11/21/2022 CLINICAL DATA:  Neuro deficit, acute, stroke suspected. Whole-body numbness, worse on left side. History of stroke with left-sided deficits. EXAM: MRI HEAD WITHOUT AND WITH CONTRAST MRA HEAD WITHOUT CONTRAST MRA NECK WITHOUT AND WITH CONTRAST TECHNIQUE: Multiplanar, multi-echo pulse sequences of the brain and surrounding structures were acquired without and with intravenous contrast. Angiographic images of the Circle of Willis were acquired using MRA technique without intravenous contrast. Angiographic images of the neck were acquired using MRA technique without and with intravenous contrast. Carotid stenosis measurements (when applicable) are obtained utilizing NASCET criteria, using the distal internal carotid diameter as the denominator. 3D post processing, including maximum intensity projections and multiplanar reformations, was performed for better evaluation of the vasculature. CONTRAST:  10mL GADAVIST GADOBUTROL 1 MMOL/ML IV SOLN COMPARISON:  MRI brain 05/20/2022.  Head CT 11/21/2022. FINDINGS: MRI HEAD FINDINGS Brain: Subacute infarct in  the left frontal operculum with petechial hemorrhage and mild surrounding edema. No significant mass effect or midline shift. Unchanged background of moderate chronic small-vessel disease with numerous perforator infarcts in the bilateral basal ganglia, thalami and cerebral white matter. Sequela of prior hemorrhage in the right external capsule/corona radiata. Multiple chronic microhemorrhages in the basal ganglia and pons. No hydrocephalus or extra-axial collection. No mass effect or midline shift. Vascular: Normal flow voids and vessel enhancement. Skull and upper cervical spine: Normal marrow signal and enhancement. Sinuses/Orbits: No acute or significant finding. Other: None. MRA HEAD FINDINGS Anterior circulation: Intracranial ICAs are patent without stenosis or aneurysm. The proximal ACAs and MCAs are patent without stenosis or aneurysm. Distal branches are symmetric. Posterior circulation: Visualized portions of the distal vertebral arteries and basilar artery are patent without stenosis or aneurysm. The SCAs, AICAs and PICAs are patent proximally. The PCAs are patent proximally without stenosis or aneurysm. Distal branches are symmetric. Anatomic variants: None. MRA NECK FINDINGS Aortic arch: Three-vessel arch configuration. Arch vessel origins are patent. Right carotid system: No evidence of dissection, stenosis (50% or greater), or occlusion. Tortuosity of the right cervical ICA. Left carotid system: No evidence of dissection, stenosis (50% or greater), or occlusion. Tortuosity of the left cervical ICA. Vertebral arteries: Left dominant. No evidence of dissection, stenosis (50% or greater), or occlusion. The right vertebral artery functionally terminates in PICA. Other: None. IMPRESSION: 1. Subacute infarct in the left frontal operculum with petechial hemorrhage and mild surrounding edema. No significant mass effect or midline shift. 2. No large vessel occlusion, hemodynamically significant stenosis, or  aneurysm in the head or neck. Electronically Signed   By: Orvan Falconer M.D.   On: 11/21/2022 18:19   MR Angiogram Neck W or Wo Contrast  Result Date: 11/21/2022 CLINICAL DATA:  Neuro deficit, acute, stroke suspected. Whole-body numbness, worse on left side. History of stroke with left-sided deficits. EXAM: MRI HEAD WITHOUT AND WITH CONTRAST MRA HEAD WITHOUT CONTRAST MRA NECK WITHOUT AND WITH CONTRAST TECHNIQUE: Multiplanar, multi-echo pulse sequences of the brain and surrounding structures were acquired without and with intravenous contrast. Angiographic images of the Circle of Willis were acquired using MRA technique without intravenous contrast. Angiographic images of the neck were acquired using MRA technique without and with intravenous contrast. Carotid stenosis measurements (when applicable) are obtained utilizing NASCET criteria, using the distal internal carotid diameter as the denominator. 3D post processing, including maximum intensity projections and multiplanar reformations, was performed for better evaluation of the vasculature. CONTRAST:  10mL GADAVIST GADOBUTROL 1 MMOL/ML IV SOLN COMPARISON:  MRI brain 05/20/2022.  Head CT 11/21/2022. FINDINGS: MRI HEAD FINDINGS Brain: Subacute infarct in the left frontal operculum with petechial hemorrhage and mild surrounding edema. No significant mass effect or midline shift. Unchanged background of moderate chronic small-vessel disease with numerous perforator infarcts in the bilateral basal ganglia, thalami and cerebral white matter. Sequela of prior hemorrhage in the right external capsule/corona radiata. Multiple chronic microhemorrhages in the basal ganglia and pons. No hydrocephalus or extra-axial collection. No mass effect or midline shift. Vascular: Normal flow voids and vessel enhancement. Skull and upper cervical spine: Normal marrow signal and enhancement. Sinuses/Orbits: No acute or significant finding. Other: None. MRA HEAD FINDINGS Anterior  circulation: Intracranial ICAs are patent without stenosis or aneurysm. The proximal ACAs and MCAs are patent without stenosis or aneurysm. Distal branches are  symmetric. Posterior circulation: Visualized portions of the distal vertebral arteries and basilar artery are patent without stenosis or aneurysm. The SCAs, AICAs and PICAs are patent proximally. The PCAs are patent proximally without stenosis or aneurysm. Distal branches are symmetric. Anatomic variants: None. MRA NECK FINDINGS Aortic arch: Three-vessel arch configuration. Arch vessel origins are patent. Right carotid system: No evidence of dissection, stenosis (50% or greater), or occlusion. Tortuosity of the right cervical ICA. Left carotid system: No evidence of dissection, stenosis (50% or greater), or occlusion. Tortuosity of the left cervical ICA. Vertebral arteries: Left dominant. No evidence of dissection, stenosis (50% or greater), or occlusion. The right vertebral artery functionally terminates in PICA. Other: None. IMPRESSION: 1. Subacute infarct in the left frontal operculum with petechial hemorrhage and mild surrounding edema. No significant mass effect or midline shift. 2. No large vessel occlusion, hemodynamically significant stenosis, or aneurysm in the head or neck. Electronically Signed   By: Orvan Falconer M.D.   On: 11/21/2022 18:19   CT Head Wo Contrast  Result Date: 11/21/2022 CLINICAL DATA:  Neuro deficit, acute, stroke suspected EXAM: CT HEAD WITHOUT CONTRAST TECHNIQUE: Contiguous axial images were obtained from the base of the skull through the vertex without intravenous contrast. RADIATION DOSE REDUCTION: This exam was performed according to the departmental dose-optimization program which includes automated exposure control, adjustment of the mA and/or kV according to patient size and/or use of iterative reconstruction technique. COMPARISON:  Brain MR 05/20/22 FINDINGS: Brain: Compared to prior exam there is new cytotoxic edema  in the left frontal lobe with associated blood products this is favored to represent an acute infarct with associated petechial hemorrhage. No evidence of a parenchymal hematoma. No hydrocephalus. No extra-axial fluid collection. Sequela of moderate chronic microvascular ischemic change with chronic infarcts in the right corona radiata and left thalamus. No mass effect. No midline shift. Vascular: No hyperdense vessel or unexpected calcification. Skull: Normal. Negative for fracture or focal lesion. Sinuses/Orbits: No middle ear or mastoid effusion. Paranasal sinuses are clear. Orbits are unremarkable. Other: None. IMPRESSION: Compared to prior exam there is new cytotoxic edema in the left frontal lobe with associated blood products. This is favored to represent an acute to subacute infarct with likely petechial hemorrhage. No definite evidence of a parenchymal hematoma Electronically Signed   By: Lorenza Cambridge M.D.   On: 11/21/2022 15:41

## 2022-12-10 NOTE — Assessment & Plan Note (Signed)
Lab Results  Component Value Date   HGB 11.0 (L) 12/10/2022   TIBC 244 (L) 11/12/2022   IRONPCTSAT 16 (L) 11/12/2022   FERRITIN 214 11/12/2022    Close monitor.

## 2022-12-11 ENCOUNTER — Ambulatory Visit: Payer: Medicaid Other | Admitting: Physical Therapy

## 2022-12-11 ENCOUNTER — Telehealth: Payer: Self-pay

## 2022-12-11 ENCOUNTER — Ambulatory Visit: Payer: Medicaid Other

## 2022-12-11 LAB — CEA: CEA: 2.8 ng/mL (ref 0.0–4.7)

## 2022-12-11 NOTE — Telephone Encounter (Signed)
LVM on secure line. Ok for [facility] to advise if call is returned.

## 2022-12-11 NOTE — Telephone Encounter (Signed)
Copied from CRM [id number]. Topic: General - Other >> Dec 11, 2022 10:57 AM Epimenio Foot F wrote: Reason for CRM: Amy with [facility] is calling in because she saw the pt for a Physical Therapy assessment and is requesting PT for patient for 2 week 1 and then 1 week 3.

## 2022-12-12 ENCOUNTER — Inpatient Hospital Stay: Payer: Medicaid Other

## 2022-12-12 DIAGNOSIS — Z5111 Encounter for antineoplastic chemotherapy: Secondary | ICD-10-CM | POA: Diagnosis not present

## 2022-12-12 DIAGNOSIS — C160 Malignant neoplasm of cardia: Secondary | ICD-10-CM

## 2022-12-12 MED ORDER — SODIUM CHLORIDE 0.9% FLUSH
10.0000 mL | INTRAVENOUS | Status: DC | PRN
  Administered 2022-12-12: 10 mL
  Filled 2022-12-12: qty 10

## 2022-12-12 MED ORDER — HEPARIN SOD (PORK) LOCK FLUSH 100 UNIT/ML IV SOLN
500.0000 [IU] | Freq: Once | INTRAVENOUS | Status: AC | PRN
  Administered 2022-12-12: 500 [IU]
  Filled 2022-12-12: qty 5

## 2022-12-16 ENCOUNTER — Encounter: Payer: Self-pay | Admitting: Oncology

## 2022-12-16 ENCOUNTER — Ambulatory Visit: Payer: Medicaid Other | Admitting: Oncology

## 2022-12-16 ENCOUNTER — Ambulatory Visit: Payer: Medicaid Other | Admitting: Physical Therapy

## 2022-12-16 ENCOUNTER — Other Ambulatory Visit: Payer: Medicaid Other

## 2022-12-16 ENCOUNTER — Other Ambulatory Visit: Payer: Self-pay

## 2022-12-16 ENCOUNTER — Ambulatory Visit: Payer: Medicaid Other

## 2022-12-16 MED ORDER — APIXABAN 2.5 MG PO TABS: 2.5000 mg | ORAL_TABLET | Freq: Two times a day (BID) | ORAL | 2 refills | Status: DC

## 2022-12-17 ENCOUNTER — Inpatient Hospital Stay: Payer: BC Managed Care – PPO | Admitting: Physician Assistant

## 2022-12-17 ENCOUNTER — Ambulatory Visit: Payer: Medicaid Other | Admitting: Neurology

## 2022-12-18 ENCOUNTER — Ambulatory Visit: Payer: Medicaid Other | Admitting: Physical Therapy

## 2022-12-18 ENCOUNTER — Telehealth: Payer: Self-pay

## 2022-12-18 ENCOUNTER — Ambulatory Visit: Payer: Medicaid Other

## 2022-12-18 ENCOUNTER — Ambulatory Visit: Payer: Medicaid Other | Admitting: Oncology

## 2022-12-18 ENCOUNTER — Other Ambulatory Visit: Payer: Medicaid Other

## 2022-12-18 NOTE — Telephone Encounter (Signed)
Clinical Social Worker returned call from patient's sister, [name].  She inquired about completing Advance Directives on 9/26.  CSW left a vm with contact information.

## 2022-12-18 NOTE — Telephone Encounter (Signed)
Patient's sister, [name], returned CSW call.  Advance Directives are scheduled for 10am on Thursday, 9/26, at [facility].

## 2022-12-19 ENCOUNTER — Encounter: Payer: Self-pay | Admitting: Registered Nurse

## 2022-12-19 ENCOUNTER — Encounter: Payer: Medicaid Other | Attending: Registered Nurse | Admitting: Registered Nurse

## 2022-12-19 VITALS — BP 148/83 | HR 101 | Ht 71.0 in | Wt 243.0 lb

## 2022-12-19 DIAGNOSIS — E7849 Other hyperlipidemia: Secondary | ICD-10-CM

## 2022-12-19 DIAGNOSIS — I10 Essential (primary) hypertension: Secondary | ICD-10-CM | POA: Diagnosis not present

## 2022-12-19 DIAGNOSIS — I639 Cerebral infarction, unspecified: Secondary | ICD-10-CM | POA: Diagnosis not present

## 2022-12-19 DIAGNOSIS — I480 Paroxysmal atrial fibrillation: Secondary | ICD-10-CM

## 2022-12-19 NOTE — Progress Notes (Signed)
Subjective:    Patient ID: Jeremiah Vance, male    DOB: February 21, 1971, 52 y.o.   MRN: [id number]  HPI: Jeremiah Vance is a 52 y.o. male who  is here for HFU appointment for follow up of his Ischemic cerebrovascular accident of frontal lobe, Paroxysmal Atrial Fibrillation, Essential Hypertension and Hyperlipidemia.   Mr.Kosiba presented to [facility] on 11/21/2022 for left -sided numbness and increasing weakness.   Dr. Clyde Lundborg: H&P: 11/21/2022 Chief Complaint: Left-sided numbness   HPI: Jeremiah Vance is a 52 y.o. male with medical history significant of GE junction carcinoma, HTN, HLD, DM, sCHF with EF 30-35%, stroke with mild left sided weakness, depression with anxiety, A-fib not on anticoagulants, obesity, who presents with left-sided numbness.   Patient states that he has history of stroke with mild left-sided weakness.  At about 9:30, he started feeling left-sided numbness.  His weakness in right arm and leg has not changed.  No vision change or hearing loss.  Patient denies chest pain, cough, SOB.  No fever or chills.  Denies symptoms of UTI.  No nausea, vomiting, diarrhea or abdominal pain.  Denies rectal bleeding or dark stool.  CT Head: WO Contrast IMPRESSION: Compared to prior exam there is new cytotoxic edema in the left frontal lobe with associated blood products. This is favored to represent an acute to subacute infarct with likely petechial hemorrhage. No definite evidence of a parenchymal hematoma  MRI: MRA IMPRESSION: 1. Subacute infarct in the left frontal operculum with petechial hemorrhage and mild surrounding edema. No significant mass effect or midline shift. 2. No large vessel occlusion, hemodynamically significant stenosis, or aneurysm in the head or neck.  Neurology consulted Mr. Alberda was cleared to begin Eliquis for CVA prophylaxis  Mr. Twisdale was admitted to inpatient rehabilitation on 11/27/2022 and discharged on 12/07/2022. He is receiving Home Health Therapy  with [facility]. He denies any pain at this time. He rated his pain 6 on Health History.   Also report he has a good appetite.    Pain Inventory Average Pain 6 Pain Right Now 6 My pain is intermittent and aching  LOCATION OF PAIN  shoulders knee  BOWEL Number of stools per week: 7  Oral laxative use No  Enema or suppository use No  History of colostomy No  Incontinent No   BLADDER Normal Bladder incontinence No  Frequent urination No  Leakage with coughing No  Difficulty starting stream No  Incomplete bladder emptying No    Mobility  Function disabled: date disabled 05/2022 I need assistance with the following:  meal prep, household duties, and shopping  Neuro/Psych weakness numbness tingling trouble walking depression anxiety  Prior Studies no  Physicians involved in your care Hospital follow up   Family History  Problem Relation Age of Onset   Diabetes Mother    Stroke Mother    Arthritis Mother    Depression Mother    Heart disease Mother    Hypertension Mother    Learning disabilities Mother    Mental illness Mother    Sleep apnea Mother    Diabetes Father    Heart disease Father    Arthritis Father    Hearing loss Father    Hyperlipidemia Father    Heart attack Father    Hypertension Father    Stroke Father    Diabetes Sister    Depression Sister    Diabetes Sister    Hypertension Sister    Mental illness Sister  Sleep apnea Sister    Diabetes Maternal Grandmother    Heart disease Maternal Grandmother    Depression Maternal Grandmother    Hyperlipidemia Maternal Grandmother    Hypertension Maternal Grandmother    Stroke Maternal Grandmother    Hypertension Maternal Grandfather    Hyperlipidemia Maternal Grandfather    Stroke Maternal Grandfather    Arthritis Paternal Grandmother    Hearing loss Paternal Grandmother    Stroke Paternal Grandfather    Heart disease Paternal Grandfather    Arthritis Paternal Grandfather     Heart attack Paternal Grandfather    Melanoma Other    Social History   Socioeconomic History   Marital status: Divorced    Spouse name: Not on file   Number of children: Not on file   Years of education: Not on file   Highest education level: Not on file  Occupational History   Not on file  Tobacco Use   Smoking status: Never   Smokeless tobacco: Never  Vaping Use   Vaping status: Never Used  Substance and Sexual Activity   Alcohol use: Yes    Comment: occasional beer   Drug use: Never   Sexual activity: Not Currently  Other Topics Concern   Not on file  Social History Narrative   He lives with his sister , [name] and she is his MPOA after his stokes   Social Determinants of Health   Financial Resource Strain: High Risk (03/20/2020)   Overall Financial Resource Strain (CARDIA)    Difficulty of Paying Living Expenses: Hard  Food Insecurity: No Food Insecurity (11/27/2022)   Hunger Vital Sign    Worried About Running Out of Food in the Last Year: Never true    Ran Out of Food in the Last Year: Never true  Transportation Needs: No Transportation Needs (11/27/2022)   PRAPARE - Administrator, Civil Service (Medical): No    Lack of Transportation (Non-Medical): No  Recent Concern: Transportation Needs - Unmet Transportation Needs (09/27/2022)   PRAPARE - Transportation    Lack of Transportation (Medical): No    Lack of Transportation (Non-Medical): Yes  Physical Activity: Not on file  Stress: Not on file  Social Connections: Not on file   Past Surgical History:  Procedure Laterality Date   ABDOMINAL HERNIA REPAIR  2008; 10/22/2017   "scope; OPEN REPAIR INCARCERATED VENTRAL HERNIA   ESOPHAGOGASTRODUODENOSCOPY (EGD) WITH PROPOFOL N/A 09/10/2022   Procedure: ESOPHAGOGASTRODUODENOSCOPY (EGD) WITH PROPOFOL;  Surgeon: Toney Reil, MD;  Location: [facility] ENDOSCOPY;  Service: Gastroenterology;  Laterality: N/A;   ESOPHAGOGASTRODUODENOSCOPY (EGD) WITH  PROPOFOL N/A 09/28/2022   Procedure: ESOPHAGOGASTRODUODENOSCOPY (EGD) WITH PROPOFOL;  Surgeon: Jaynie Collins, DO;  Location: [facility] ENDOSCOPY;  Service: Gastroenterology;  Laterality: N/A;   HEMOSTASIS CONTROL  09/28/2022   Procedure: HEMOSTASIS CONTROL;  Surgeon: Jaynie Collins, DO;  Location: [facility] ENDOSCOPY;  Service: Gastroenterology;;   HERNIA REPAIR     KNEE ARTHROSCOPY Right 1989   PORTA CATH INSERTION N/A 09/25/2022   Procedure: PORTA CATH INSERTION;  Surgeon: Annice Needy, MD;  Location: [facility] INVASIVE CV LAB;  Service: Cardiovascular;  Laterality: N/A;   VENTRAL HERNIA REPAIR N/A 10/22/2017   Procedure: OPEN REPAIR INCARCERATED VENTRAL HERNIA;  Surgeon: Glenna Fellows, MD;  Location: [facility] OR;  Service: General;  Laterality: N/A;   Past Medical History:  Diagnosis Date   Acute ischemic stroke (HCC) 09/06/2017   Acute renal failure superimposed on stage 3a chronic kidney disease (HCC) 07/08/2019   Acute right-sided  weakness    Allergies    Anasarca    Anemia 07/10/2017   Arthritis    Cancer (HCC)    stomach and esophageal cancer stage 4   CHF (congestive heart failure) (HCC)    "coinsided w/kidney problems I was having 06/2017"   Chicken pox    CKD (chronic kidney disease) stage 3, GFR 30-59 ml/min (HCC) 06/2017   Depression    Elevated troponin 07/08/2019   High cholesterol    History of cardiomyopathy    LVEF 40 to 45% in April 2019 - subsequently normalized   Hyperbilirubinemia 07/10/2017   Hypertension    Ischemic stroke (HCC)    Small left internal capsule infarct due to lacunar disease   Morbid obesity (HCC)    Normocytic anemia 12/16/2017   Recurrent incisional hernia with incarceration s/p repair 10/22/2017 10/21/2017   Stroke (HCC) 08/2017   "right sided weakness since; getting stronger though" (10/22/2017)   Type 2 diabetes mellitus (HCC)    BP (!) 148/83   Pulse (!) 101   Ht 5\' 11"  (1.803 m)   Wt 243 lb (110.2 kg) Comment: pt refused  SpO2 98%    BMI 33.89 kg/m   Opioid Risk Score:   Fall Risk Score:  `1  Depression screen PHQ 2/9     12/19/2022    1:12 PM 10/29/2022    3:42 PM 05/01/2022    1:11 PM 04/23/2022   11:07 AM 11/28/2021    2:39 PM 05/19/2020   10:47 AM 08/02/2019   12:39 PM  Depression screen PHQ 2/9  Decreased Interest 0 0 0 0 2 0 0  Down, Depressed, Hopeless 0 3 0 0 1 1 0  PHQ - 2 Score 0 3 0 0 3 1 0  Altered sleeping 1 0 1 1 3  0 0  Tired, decreased energy 1 1 3 1 3 1  0  Change in appetite 2 1 0 0 0 0 0  Feeling bad or failure about yourself  0 2 0 0 0 1 0  Trouble concentrating 2 0 1 0 2 0 0  Moving slowly or fidgety/restless 1 1 1 3 2 1  0  Suicidal thoughts 0 0 0 0 0 0 0  PHQ-9 Score 7 8 6 5 13 4  0  Difficult doing work/chores  Somewhat difficult Not difficult at all  Not difficult at all Not difficult at all Not difficult at all     Review of Systems  Musculoskeletal:        Shoulder knee pain  All other systems reviewed and are negative.      Objective:   Physical Exam Vitals and nursing note reviewed.  Constitutional:      Appearance: Normal appearance. He is obese.  Cardiovascular:     Rate and Rhythm: Rhythm irregular.     Pulses: Normal pulses.     Heart sounds: Normal heart sounds.  Pulmonary:     Effort: Pulmonary effort is normal.     Breath sounds: Normal breath sounds.  Musculoskeletal:     Cervical back: Normal range of motion and neck supple.     Comments: Normal Muscle Bulk and Muscle Testing Reveals:  Upper Extremities: Full ROM and Muscle Strength 5/5 Lower Extremities: Full ROM and Muscle Strength 5/5 Arises from Table slowly  Narrow Based  Gait     Skin:    General: Skin is warm and dry.  Neurological:     Mental Status: He is alert and oriented to person, place,  and time.  Psychiatric:        Mood and Affect: Mood normal.        Behavior: Behavior normal.           Assessment & Plan:  Ischemic cerebrovascular accident of frontal lobe: Continue current  medication regimen. Continue Outpatient Home Health Therapy with [facility]. He has a scheduled appointment with Neurology.  Paroxysmal Atrial Fibrillation: Continue current Medication Regimen.PCP Following. Continue to Monitor.  3.  Essential Hypertension: Continue current medication regimen: PCP [place]. Continue to Monitor.  4. Hyperlipidemia: Continue current medication regimen. PCP following. Continue to Monitor.   F/U with Dr Wynn Banker in 4- 6 weeks

## 2022-12-20 ENCOUNTER — Telehealth: Payer: Self-pay

## 2022-12-20 ENCOUNTER — Other Ambulatory Visit: Payer: Self-pay | Admitting: Oncology

## 2022-12-20 DIAGNOSIS — C160 Malignant neoplasm of cardia: Secondary | ICD-10-CM

## 2022-12-20 NOTE — Telephone Encounter (Signed)
FYI   Copied from CRM [id number]. Topic: General - Other >> Dec 20, 2022  8:53 AM Phill Myron wrote: Jeremiah Vance with [facility] stated patient declined services today. PT home care.

## 2022-12-23 ENCOUNTER — Ambulatory Visit: Payer: Medicaid Other | Admitting: Physical Therapy

## 2022-12-23 ENCOUNTER — Ambulatory Visit: Payer: Medicaid Other

## 2022-12-23 ENCOUNTER — Inpatient Hospital Stay: Payer: Medicaid Other

## 2022-12-23 MED FILL — Dexamethasone Sodium Phosphate Inj 100 MG/10ML: INTRAMUSCULAR | Qty: 1 | Status: AC

## 2022-12-23 NOTE — Progress Notes (Signed)
[facility] CSW Progress Note  Clinical Social Worker contacted patient's sister, [name] by phone to inform her that the caregiver support program has been cancelled due to lack of attendees.  Will inform her of future programs.    Dorothey Baseman, LCSW Clinical Social Worker [facility]    Patient is participating in a Managed Medicaid Plan:  Yes

## 2022-12-24 ENCOUNTER — Inpatient Hospital Stay: Payer: Medicaid Other

## 2022-12-24 ENCOUNTER — Inpatient Hospital Stay (HOSPITAL_BASED_OUTPATIENT_CLINIC_OR_DEPARTMENT_OTHER): Payer: Medicaid Other | Admitting: Oncology

## 2022-12-24 ENCOUNTER — Encounter: Payer: Self-pay | Admitting: Oncology

## 2022-12-24 ENCOUNTER — Ambulatory Visit: Payer: BC Managed Care – PPO | Admitting: Family Medicine

## 2022-12-24 VITALS — BP 125/74 | HR 91 | Temp 96.2°F | Resp 18 | Wt 245.7 lb

## 2022-12-24 VITALS — BP 157/76 | HR 63

## 2022-12-24 DIAGNOSIS — G620 Drug-induced polyneuropathy: Secondary | ICD-10-CM

## 2022-12-24 DIAGNOSIS — F411 Generalized anxiety disorder: Secondary | ICD-10-CM

## 2022-12-24 DIAGNOSIS — C801 Malignant (primary) neoplasm, unspecified: Secondary | ICD-10-CM | POA: Diagnosis not present

## 2022-12-24 DIAGNOSIS — I639 Cerebral infarction, unspecified: Secondary | ICD-10-CM

## 2022-12-24 DIAGNOSIS — K7469 Other cirrhosis of liver: Secondary | ICD-10-CM | POA: Diagnosis not present

## 2022-12-24 DIAGNOSIS — C160 Malignant neoplasm of cardia: Secondary | ICD-10-CM

## 2022-12-24 DIAGNOSIS — Z5111 Encounter for antineoplastic chemotherapy: Secondary | ICD-10-CM | POA: Diagnosis not present

## 2022-12-24 DIAGNOSIS — D500 Iron deficiency anemia secondary to blood loss (chronic): Secondary | ICD-10-CM

## 2022-12-24 DIAGNOSIS — I480 Paroxysmal atrial fibrillation: Secondary | ICD-10-CM | POA: Diagnosis not present

## 2022-12-24 DIAGNOSIS — T451X5A Adverse effect of antineoplastic and immunosuppressive drugs, initial encounter: Secondary | ICD-10-CM | POA: Diagnosis not present

## 2022-12-24 LAB — CBC WITH DIFFERENTIAL (CANCER CENTER ONLY)
Abs Immature Granulocytes: 0.01 10*3/uL (ref 0.00–0.07)
Basophils Absolute: 0 10*3/uL (ref 0.0–0.1)
Basophils Relative: 0 %
Eosinophils Absolute: 0 10*3/uL (ref 0.0–0.5)
Eosinophils Relative: 0 %
HCT: 37.3 % — ABNORMAL LOW (ref 39.0–52.0)
Hemoglobin: 11.6 g/dL — ABNORMAL LOW (ref 13.0–17.0)
Immature Granulocytes: 0 %
Lymphocytes Relative: 10 %
Lymphs Abs: 0.5 10*3/uL — ABNORMAL LOW (ref 0.7–4.0)
MCH: 26.2 pg (ref 26.0–34.0)
MCHC: 31.1 g/dL (ref 30.0–36.0)
MCV: 84.4 fL (ref 80.0–100.0)
Monocytes Absolute: 0.2 10*3/uL (ref 0.1–1.0)
Monocytes Relative: 4 %
Neutro Abs: 4 10*3/uL (ref 1.7–7.7)
Neutrophils Relative %: 86 %
Platelet Count: 245 10*3/uL (ref 150–400)
RBC: 4.42 MIL/uL (ref 4.22–5.81)
RDW: 19.9 % — ABNORMAL HIGH (ref 11.5–15.5)
WBC Count: 4.7 10*3/uL (ref 4.0–10.5)
nRBC: 0 % (ref 0.0–0.2)

## 2022-12-24 LAB — CMP (CANCER CENTER ONLY)
ALT: 18 U/L (ref 0–44)
AST: 20 U/L (ref 15–41)
Albumin: 3.5 g/dL (ref 3.5–5.0)
Alkaline Phosphatase: 99 U/L (ref 38–126)
Anion gap: 7 (ref 5–15)
BUN: 22 mg/dL — ABNORMAL HIGH (ref 6–20)
CO2: 20 mmol/L — ABNORMAL LOW (ref 22–32)
Calcium: 8.7 mg/dL — ABNORMAL LOW (ref 8.9–10.3)
Chloride: 110 mmol/L (ref 98–111)
Creatinine: 1.44 mg/dL — ABNORMAL HIGH (ref 0.61–1.24)
GFR, Estimated: 58 mL/min — ABNORMAL LOW (ref >60)
Glucose, Bld: 211 mg/dL — ABNORMAL HIGH (ref 70–99)
Potassium: 4 mmol/L (ref 3.5–5.1)
Sodium: 137 mmol/L (ref 135–145)
Total Bilirubin: 0.6 mg/dL (ref 0.3–1.2)
Total Protein: 7.4 g/dL (ref 6.5–8.1)

## 2022-12-24 LAB — SAMPLE TO BLOOD BANK

## 2022-12-24 MED ORDER — DEXTROSE 5 % IV SOLN
Freq: Once | INTRAVENOUS | Status: AC
  Filled 2022-12-24: qty 250

## 2022-12-24 MED ORDER — PALONOSETRON HCL INJECTION 0.25 MG/5ML
0.2500 mg | Freq: Once | INTRAVENOUS | Status: AC
  Administered 2022-12-24: 0.25 mg via INTRAVENOUS
  Filled 2022-12-24: qty 5

## 2022-12-24 MED ORDER — LEUCOVORIN CALCIUM INJECTION 350 MG
400.0000 mg/m2 | Freq: Once | INTRAVENOUS | Status: AC
  Administered 2022-12-24: 928 mg via INTRAVENOUS
  Filled 2022-12-24: qty 46.4

## 2022-12-24 MED ORDER — OXALIPLATIN CHEMO INJECTION 100 MG/20ML
65.0000 mg/m2 | Freq: Once | INTRAVENOUS | Status: AC
  Administered 2022-12-24: 150 mg via INTRAVENOUS
  Filled 2022-12-24: qty 10

## 2022-12-24 MED ORDER — SODIUM CHLORIDE 0.9 % IV SOLN
10.0000 mg | Freq: Once | INTRAVENOUS | Status: AC
  Administered 2022-12-24: 10 mg via INTRAVENOUS
  Filled 2022-12-24: qty 10

## 2022-12-24 MED ORDER — SODIUM CHLORIDE 0.9 % IV SOLN
2400.0000 mg/m2 | INTRAVENOUS | Status: DC
  Administered 2022-12-24: 5550 mg via INTRAVENOUS
  Filled 2022-12-24: qty 111

## 2022-12-24 NOTE — Assessment & Plan Note (Signed)
LFT is stable.

## 2022-12-24 NOTE — Assessment & Plan Note (Signed)
Recommend Ativan 0.5 mg every 12 hours as needed for anxiety/sleep/nausea

## 2022-12-24 NOTE — Assessment & Plan Note (Signed)
-  Continue Eliquis

## 2022-12-24 NOTE — Assessment & Plan Note (Addendum)
Lab Results  Component Value Date   HGB 11.6 (L) 12/24/2022   TIBC 224 (L) 12/10/2022   IRONPCTSAT 25 12/10/2022   FERRITIN 229 12/10/2022    Close monitor. Stable counts.

## 2022-12-24 NOTE — Progress Notes (Signed)
Hematology/Oncology Progress note [phone] [phone]        REFERRING PROVIDER: Rickard Patience, MD    CHIEF COMPLAINTS/PURPOSE OF CONSULTATION:  GE junction cancer/gastric cardia cancer.  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  PD-L1 TPS<1%, CPS 3, TMB 8.4, MSI can not be accessed. Labs are reviewed and discussed with patient. Proceed with 5-FU +Oxaliplatin 65mg /m2  Repeat PET scan Oct 2024 -after 6 cycles of treatment.  Other cirrhosis of liver (HCC) LFT is stable.   IDA (iron deficiency anemia) Lab Results  Component Value Date   HGB 11.6 (L) 12/24/2022   TIBC 224 (L) 12/10/2022   IRONPCTSAT 25 12/10/2022   FERRITIN 229 12/10/2022    Close monitor. Stable counts.    Chemotherapy-induced neuropathy (HCC) Recommend patient to take gabapentin 200mg  BID.   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 MRI of the brain with and without contrast showed subacute infarct in the left frontal operculum with petechial hemorrhage and mild surrounding edema. No significant mass effect or midline shift. MRA negative foir LVO. The MRI findings of left frontal operculum does not seem to explain his left-sided numbness. Neurologist during admission recommend PET brain or follow up with another MRI. 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 Barbaraann Cao for evaluation and management.  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    Paroxysmal atrial fibrillation (HCC) Continue Eliquis    No orders of the defined types were placed in this encounter.   2 weeks lab MD FOLFOX  All questions were answered. The patient knows to call the clinic with any problems, questions or concerns.  Jeremiah Patience, MD, PhD [facility] Hematology Oncology 12/24/2022    HISTORY OF PRESENTING ILLNESS:  Jeremiah Vance 52 y.o. male presents to establish care for GE junction/gastric cardia adenocarcinoma I have reviewed his chart and materials related to his cancer extensively and collaborated history with the patient. Summary of oncologic history is as follows: Oncology History  Gastric cancer (HCC)  09/08/2022 Imaging   CT abdomen pelvis w contrast     09/08/2022 Imaging   CT abdomen pelvis with contrast  Diffuse masslike thickening in the area of the GE junction and upper stomach with the extensive abnormal lymph nodes throughout the abdomen including retrocrural, porta hepatis, retroperitoneum and lesser and greater curve of the stomach. In addition there are areas of suggest peritoneal carcinomatosis. Recommend further evaluation.    No bowel obstruction. Large midline anterior pelvic wall hernia involving small bowel and mesenteric fat with rectus muscle diastasis. Gallstones.   09/19/2022 Cancer Staging   Staging form: Stomach, AJCC 8th Edition - Clinical stage from 09/19/2022: Stage IVB (cTX, cN2, cM1) - Signed by Jeremiah Patience, MD on 09/19/2022 Stage prefix: Initial diagnosis   09/19/2022 Initial Diagnosis   Gastric cancer (HCC)  09/08/2022, patient presented emergency room for evaluation of abdominal pain and vomiting.  He has also had weight loss.  Dysphagia with solid food for couple of months.  CT abdomen pelvis showed diffuse masslike thickening of  the GE junction and upper stomach with extensive abnormal lymph nodes throughout abdomen.  09/10/2022 upper endoscopy showed malignant appearance gastric tumor at the GE junction and in  the cardia Biopsied.  Nodular mucosa in the second portion of duodenum.  Biopsied. GE junction mass showed moderate to poorly differentiated adenocarcinoma undermining squamous mucosa with focal area of squamous atypia.  Cannot exclude adenosquamous carcinoma.  Intestinal metaplasia is not identified. Duodenum cold biopsy-reactive duodenitis.  Negative for dysplasia and malignancy. Stomach random biopsy negative for H. pylori, intestinal metaplasia, dysplasia and malignancy.  Stomach Cardia mass biopsy showed moderate to poorly differentiated adenocarcinoma.  Patient's case was discussed on tumor board on 09/19/2022.  Per Dr. Oneita Kras, nephrology findings of both GE junction mass and gastric cardia mass or similar. HER2 staining negative. PD-L1 TPS<1%, CPS 3, TMB 8.4, MSI can not be accessed.  KMT2D frame shift, GATA6 copy number gain.  Molecular testing is pending.     09/23/2022 Imaging   PET scan showed 1. 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 2. Enlarged cirrhotic liver. 3. Cholelithiasis. 4. Bilateral renal stones. 5. Umbilical hernias contain unobstructed small bowel and/or fat.  6. Aortic atherosclerosis (ICD10-I70.0). Coronary artery calcification. 7. Enlarged pulmonic trunk, indicative of pulmonary arterial hypertension.    10/02/2022 -  Chemotherapy   Patient is on Treatment Plan : GASTROESOPHAGEAL FOLFOX q14d     He lives at home with his sister.  Patient is undergoing neuro physical therapy. 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 A fib previously on Eliquis, which was held due to severe iron deficiency anemia.  Restarted in August 2024 due to subacute CVA.  11/21/2022 - 11/27/2022, patient was hospitalized.  He initially presented to emergency room due to left sided numbness.  MRI of the brain showed subacute infarct in the left frontal lobe with petechial  hemorrhage and mild surrounding edema.  He has no right-sided neurologic deficits.  MRI finding was not explaining left-sided numbness.  He has chronic left sided weakness due to previous CVA.  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  Patient was discharged to rehab and was recently discharged.  INTERVAL HISTORY Jeremiah Vance is a 52 y.o. male who has above history reviewed by me today presents for follow up visit for stage IV GE junction/gastric cancer.   He reports feeling well.  Denies any blood in the stool since the start of anticoagulation.  Appetite is good.  Weight is stable.  He presents for evaluation prior to chemotherapy.  MEDICAL HISTORY:  Past Medical History:  Diagnosis Date   Acute ischemic stroke (HCC) 09/06/2017   Acute renal failure superimposed on stage 3a chronic kidney disease (HCC) 07/08/2019   Acute right-sided weakness    Allergies    Anasarca    Anemia 07/10/2017   Arthritis    Cancer (HCC)    stomach and esophageal cancer stage 4   CHF (congestive heart failure) (HCC)    "coinsided w/kidney problems I was having 06/2017"   Chicken pox    CKD (chronic kidney disease) stage 3, GFR 30-59 ml/min (HCC) 06/2017   Depression    Elevated troponin 07/08/2019   High cholesterol    History of cardiomyopathy    LVEF 40 to 45% in April 2019 - subsequently normalized   Hyperbilirubinemia 07/10/2017   Hypertension  Ischemic stroke (HCC)    Small left internal capsule infarct due to lacunar disease   Morbid obesity (HCC)    Normocytic anemia 12/16/2017   Recurrent incisional hernia with incarceration s/p repair 10/22/2017 10/21/2017   Stroke (HCC) 08/2017   "right sided weakness since; getting stronger though" (10/22/2017)   Type 2 diabetes mellitus (HCC)     SURGICAL HISTORY: Past Surgical History:  Procedure Laterality Date   ABDOMINAL HERNIA  REPAIR  2008; 10/22/2017   "scope; OPEN REPAIR INCARCERATED VENTRAL HERNIA   ESOPHAGOGASTRODUODENOSCOPY (EGD) WITH PROPOFOL N/A 09/10/2022   Procedure: ESOPHAGOGASTRODUODENOSCOPY (EGD) WITH PROPOFOL;  Surgeon: Toney Reil, MD;  Location: [facility] ENDOSCOPY;  Service: Gastroenterology;  Laterality: N/A;   ESOPHAGOGASTRODUODENOSCOPY (EGD) WITH PROPOFOL N/A 09/28/2022   Procedure: ESOPHAGOGASTRODUODENOSCOPY (EGD) WITH PROPOFOL;  Surgeon: Jaynie Collins, DO;  Location: [facility] ENDOSCOPY;  Service: Gastroenterology;  Laterality: N/A;   HEMOSTASIS CONTROL  09/28/2022   Procedure: HEMOSTASIS CONTROL;  Surgeon: Jaynie Collins, DO;  Location: [facility] ENDOSCOPY;  Service: Gastroenterology;;   HERNIA REPAIR     KNEE ARTHROSCOPY Right 1989   PORTA CATH INSERTION N/A 09/25/2022   Procedure: PORTA CATH INSERTION;  Surgeon: Annice Needy, MD;  Location: [facility] INVASIVE CV LAB;  Service: Cardiovascular;  Laterality: N/A;   VENTRAL HERNIA REPAIR N/A 10/22/2017   Procedure: OPEN REPAIR INCARCERATED VENTRAL HERNIA;  Surgeon: Glenna Fellows, MD;  Location: [facility] OR;  Service: General;  Laterality: N/A;    SOCIAL HISTORY: Social History   Socioeconomic History   Marital status: Divorced    Spouse name: Not on file   Number of children: Not on file   Years of education: Not on file   Highest education level: Not on file  Occupational History   Not on file  Tobacco Use   Smoking status: Never   Smokeless tobacco: Never  Vaping Use   Vaping status: Never Used  Substance and Sexual Activity   Alcohol use: Yes    Comment: occasional beer   Drug use: Never   Sexual activity: Not Currently  Other Topics Concern   Not on file  Social History Narrative   He lives with his sister , [name] and she is his MPOA after his stokes   Social Determinants of Health   Financial Resource Strain: High Risk (03/20/2020)   Overall Financial Resource Strain (CARDIA)    Difficulty of Paying Living Expenses:  Hard  Food Insecurity: No Food Insecurity (11/27/2022)   Hunger Vital Sign    Worried About Running Out of Food in the Last Year: Never true    Ran Out of Food in the Last Year: Never true  Transportation Needs: No Transportation Needs (11/27/2022)   PRAPARE - Administrator, Civil Service (Medical): No    Lack of Transportation (Non-Medical): No  Recent Concern: Transportation Needs - Unmet Transportation Needs (09/27/2022)   PRAPARE - Transportation    Lack of Transportation (Medical): No    Lack of Transportation (Non-Medical): Yes  Physical Activity: Not on file  Stress: Not on file  Social Connections: Not on file  Intimate Partner Violence: Not At Risk (11/27/2022)   Humiliation, Afraid, Rape, and Kick questionnaire    Fear of Current or Ex-Partner: No    Emotionally Abused: No    Physically Abused: No    Sexually Abused: No    FAMILY HISTORY: Family History  Problem Relation Age of Onset   Diabetes Mother    Stroke Mother    Arthritis Mother  Depression Mother    Heart disease Mother    Hypertension Mother    Learning disabilities Mother    Mental illness Mother    Sleep apnea Mother    Diabetes Father    Heart disease Father    Arthritis Father    Hearing loss Father    Hyperlipidemia Father    Heart attack Father    Hypertension Father    Stroke Father    Diabetes Sister    Depression Sister    Diabetes Sister    Hypertension Sister    Mental illness Sister    Sleep apnea Sister    Diabetes Maternal Grandmother    Heart disease Maternal Grandmother    Depression Maternal Grandmother    Hyperlipidemia Maternal Grandmother    Hypertension Maternal Grandmother    Stroke Maternal Grandmother    Hypertension Maternal Grandfather    Hyperlipidemia Maternal Grandfather    Stroke Maternal Grandfather    Arthritis Paternal Grandmother    Hearing loss Paternal Grandmother    Stroke Paternal Grandfather    Heart disease Paternal Grandfather     Arthritis Paternal Grandfather    Heart attack Paternal Grandfather    Melanoma Other     ALLERGIES:  is allergic to pollen extract and sulfa antibiotics.  MEDICATIONS:  Current Outpatient Medications  Medication Sig Dispense Refill   acetaminophen (TYLENOL) 325 MG tablet Take 2 tablets (650 mg total) by mouth every 4 (four) hours as needed for mild pain (or temp > 37.5 C (99.5 F)).     apixaban (ELIQUIS) 2.5 MG TABS tablet Take 1 tablet (2.5 mg total) by mouth 2 (two) times daily. 60 tablet 2   atorvastatin (LIPITOR) 40 MG tablet Take 1 tablet (40 mg total) by mouth daily at 6 PM. 90 tablet 3   busPIRone (BUSPAR) 7.5 MG tablet Take 1 tablet (7.5 mg total) by mouth 2 (two) times daily. 180 tablet 1   Continuous Glucose Sensor (FREESTYLE LIBRE 2 SENSOR) MISC Place 1 sensor on the skin every 14 days. Use to check glucose continuously 2 each 11   dapagliflozin propanediol (FARXIGA) 10 MG TABS tablet Take 1 tablet (10 mg total) by mouth daily. 30 tablet 0   dexamethasone (DECADRON) 4 MG tablet Take 2 tablets (8 mg total) by mouth daily. Start the day after chemotherapy for 2 days. Take with food. 30 tablet 1   diclofenac Sodium (VOLTAREN) 1 % GEL Apply 4 g topically 4 (four) times daily.     gabapentin (NEURONTIN) 100 MG capsule Take 2 capsules (200 mg total) by mouth 2 (two) times daily. 120 capsule 0   hydrALAZINE (APRESOLINE) 25 MG tablet Take 1 tablet (25 mg total) by mouth every 8 (eight) hours. 90 tablet 0   lamoTRIgine (LAMICTAL) 25 MG tablet Take one tablet twice a day for 14 days, then one tablet each morning and two tablets at night for 7 days, then two tablets in the morning and 3 tablets at night for 7 days, then as directed by neurologist 84 tablet 0   ondansetron (ZOFRAN-ODT) 8 MG disintegrating tablet Take 1 tablet (8 mg total) by mouth every 8 (eight) hours as needed for nausea or vomiting. 60 tablet 1   oxyCODONE (OXY IR/ROXICODONE) 5 MG immediate release tablet Take 5 mg by mouth  every 4 (four) hours as needed for severe pain.     pantoprazole (PROTONIX) 40 MG tablet Take 1 tablet (40 mg total) by mouth 2 (two) times daily before a meal.  60 tablet 3   prochlorperazine (COMPAZINE) 10 MG tablet Take 1 tablet (10 mg total) by mouth every 6 (six) hours as needed for nausea or vomiting. 60 tablet 0   scopolamine (TRANSDERM-SCOP) 1 MG/3DAYS Place 1 patch (1.5 mg total) onto the skin every 3 (three) days. 4 patch 0   sertraline (ZOLOFT) 50 MG tablet Take 1 tablet (50 mg total) by mouth daily. 30 tablet 3   torsemide (DEMADEX) 10 MG tablet Take 1 tablet (10 mg total) by mouth daily as needed (lower ext swelling). 30 tablet 0   No current facility-administered medications for this visit.   Facility-Administered Medications Ordered in Other Visits  Medication Dose Route Frequency Provider Last Rate Last Admin   fluorouracil (ADRUCIL) 5,550 mg in sodium chloride 0.9 % 139 mL chemo infusion  2,400 mg/m2 (Treatment Plan Recorded) Intravenous 1 day or 1 dose Jeremiah Patience, MD       leucovorin 928 mg in dextrose 5 % 250 mL infusion  400 mg/m2 (Treatment Plan Recorded) Intravenous Once Jeremiah Patience, MD       oxaliplatin (ELOXATIN) 150 mg in dextrose 5 % 500 mL chemo infusion  65 mg/m2 (Treatment Plan Recorded) Intravenous Once Jeremiah Patience, MD       palonosetron (ALOXI) injection 0.25 mg  0.25 mg Intravenous Once Jeremiah Patience, MD        Review of Systems  Constitutional:  Positive for fatigue. Negative for appetite change, chills, fever and unexpected weight change.  HENT:   Negative for hearing loss and voice change.   Eyes:  Negative for eye problems and icterus.  Respiratory:  Negative for chest tightness, cough and shortness of breath.   Cardiovascular:  Negative for chest pain and leg swelling.  Gastrointestinal:  Negative for abdominal distention, abdominal pain and nausea.  Endocrine: Negative for hot flashes.  Genitourinary:  Negative for difficulty urinating, dysuria and frequency.    Musculoskeletal:  Negative for arthralgias.  Skin:  Negative for itching and rash.  Neurological:  Positive for numbness. Negative for light-headedness.  Hematological:  Negative for adenopathy. Does not bruise/bleed easily.  Psychiatric/Behavioral:  Negative for confusion.      PHYSICAL EXAMINATION: ECOG PERFORMANCE STATUS: 2 - Symptomatic, <50% confined to bed  Vitals:   12/24/22 0859  BP: 125/74  Pulse: 91  Resp: 18  Temp: (!) 96.2 F (35.7 C)  SpO2: 97%   Filed Weights   12/24/22 0859  Weight: 245 lb 11.2 oz (111.4 kg)    Physical Exam Constitutional:      General: He is not in acute distress.    Appearance: He is obese. He is not diaphoretic.  HENT:     Head: Normocephalic and atraumatic.  Eyes:     General: No scleral icterus.    Pupils: Pupils are equal, round, and reactive to light.  Cardiovascular:     Rate and Rhythm: Normal rate.  Pulmonary:     Effort: Pulmonary effort is normal. No respiratory distress.  Abdominal:     General: There is no distension.     Palpations: Abdomen is soft.     Tenderness: There is no abdominal tenderness.  Musculoskeletal:        General: Normal range of motion.     Cervical back: Normal range of motion and neck supple.     Right lower leg: No edema.     Left lower leg: Edema present.     Comments: Left foot swelling and tenderness  Skin:    General:  Skin is warm and dry.  Neurological:     Mental Status: He is alert and oriented to person, place, and time. Mental status is at baseline.     Cranial Nerves: No cranial nerve deficit.     Motor: No abnormal muscle tone.  Psychiatric:        Mood and Affect: Mood and affect normal.      LABORATORY DATA:  I have reviewed the data as listed    Latest Ref Rng & Units 12/24/2022    8:50 AM 12/10/2022   11:00 AM 12/02/2022    6:47 AM  CBC  WBC 4.0 - 10.5 K/uL 4.7  8.3  5.8   Hemoglobin 13.0 - 17.0 g/dL 65.7  84.6  9.7   Hematocrit 39.0 - 52.0 % 37.3  36.0  32.2    Platelets 150 - 400 K/uL 245  353  439       Latest Ref Rng & Units 12/24/2022    8:50 AM 12/10/2022   11:00 AM 12/06/2022    6:59 AM  CMP  Glucose 70 - 99 mg/dL 962  952  841   BUN 6 - 20 mg/dL 22  34  28   Creatinine 0.61 - 1.24 mg/dL 3.24  4.01  0.27   Sodium 135 - 145 mmol/L 137  137  137   Potassium 3.5 - 5.1 mmol/L 4.0  3.7  4.4   Chloride 98 - 111 mmol/L 110  106  98   CO2 22 - 32 mmol/L 20  23  29    Calcium 8.9 - 10.3 mg/dL 8.7  8.4  8.5   Total Protein 6.5 - 8.1 g/dL 7.4  7.2    Total Bilirubin 0.3 - 1.2 mg/dL 0.6  0.4    Alkaline Phos 38 - 126 U/L 99  103    AST 15 - 41 U/L 20  27    ALT 0 - 44 U/L 18  22       RADIOGRAPHIC STUDIES: I have personally reviewed the radiological images as listed and agreed with the findings in the report. No results found.

## 2022-12-24 NOTE — Patient Instructions (Signed)
[place] [facility]  Discharge Instructions: Thank you for choosing [facility] to provide your oncology and hematology care.  If you have a lab appointment with the Cancer Center, please go directly to the Cancer Center and check in at the registration area.  Wear comfortable clothing and clothing appropriate for easy access to any Portacath or PICC line.   We strive to give you quality time with your provider. You may need to reschedule your appointment if you arrive late (15 or more minutes).  Arriving late affects you and other patients whose appointments are after yours.  Also, if you miss three or more appointments without notifying the office, you may be dismissed from the clinic at the provider's discretion.      For prescription refill requests, have your pharmacy contact our office and allow 72 hours for refills to be completed.    Today you received the following chemotherapy and/or immunotherapy agents adrucil, oxaplatin     To help prevent nausea and vomiting after your treatment, we encourage you to take your nausea medication as directed.  BELOW ARE SYMPTOMS THAT SHOULD BE REPORTED IMMEDIATELY: *FEVER GREATER THAN 100.4 F (38 C) OR HIGHER *CHILLS OR SWEATING *NAUSEA AND VOMITING THAT IS NOT CONTROLLED WITH YOUR NAUSEA MEDICATION *UNUSUAL SHORTNESS OF BREATH *UNUSUAL BRUISING OR BLEEDING *URINARY PROBLEMS (pain or burning when urinating, or frequent urination) *BOWEL PROBLEMS (unusual diarrhea, constipation, pain near the anus) TENDERNESS IN MOUTH AND THROAT WITH OR WITHOUT PRESENCE OF ULCERS (sore throat, sores in mouth, or a toothache) UNUSUAL RASH, SWELLING OR PAIN  UNUSUAL VAGINAL DISCHARGE OR ITCHING   Items with * indicate a potential emergency and should be followed up as soon as possible or go to the Emergency Department if any problems should occur.  Please show the CHEMOTHERAPY ALERT CARD or IMMUNOTHERAPY ALERT CARD at  check-in to the Emergency Department and triage nurse.  Should you have questions after your visit or need to cancel or reschedule your appointment, please contact [place] [facility]  [phone] and follow the prompts.  Office hours are 8:00 a.m. to 4:30 p.m. Monday - Friday. Please note that voicemails left after 4:00 p.m. may not be returned until the following business day.  We are closed weekends and major holidays. You have access to a nurse at all times for urgent questions. Please call the main number to the clinic [phone] and follow the prompts.  For any non-urgent questions, you may also contact your provider using MyChart. We now offer e-Visits for anyone 98 and older to request care online for non-urgent symptoms. For details visit [email or web address].   Also download the MyChart app! Go to the app store, search "MyChart", open the app, select [place], and log in with your MyChart username and password.

## 2022-12-24 NOTE — Assessment & Plan Note (Signed)
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  PD-L1 TPS<1%, CPS 3, TMB 8.4, MSI can not be accessed. Labs are reviewed and discussed with patient. Proceed with 5-FU +Oxaliplatin 65mg /m2  Repeat PET scan Oct 2024 -after 6 cycles of treatment.

## 2022-12-24 NOTE — Assessment & Plan Note (Signed)
pt has hx of stroke, now has left-sided numbness. MRI of the brain with and without contrast showed subacute infarct in the left frontal operculum with petechial hemorrhage and mild surrounding edema. No significant mass effect or midline shift. MRA negative foir LVO. The MRI findings of left frontal operculum does not seem to explain his left-sided numbness. Neurologist during admission recommend PET brain or follow up with another MRI. 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 Barbaraann Cao for evaluation and management.  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

## 2022-12-24 NOTE — Assessment & Plan Note (Signed)
Recommend patient to take gabapentin 200mg  BID.

## 2022-12-25 ENCOUNTER — Other Ambulatory Visit: Payer: Medicaid Other

## 2022-12-25 ENCOUNTER — Ambulatory Visit: Payer: Medicaid Other

## 2022-12-25 ENCOUNTER — Ambulatory Visit: Payer: Medicaid Other | Admitting: Oncology

## 2022-12-25 ENCOUNTER — Ambulatory Visit: Payer: Medicaid Other | Admitting: Physical Therapy

## 2022-12-25 LAB — CEA: CEA: 3.5 ng/mL (ref 0.0–4.7)

## 2022-12-26 ENCOUNTER — Inpatient Hospital Stay: Payer: Medicaid Other

## 2022-12-26 ENCOUNTER — Other Ambulatory Visit: Payer: Medicaid Other | Admitting: Licensed Clinical Social Worker

## 2022-12-26 VITALS — BP 139/89 | HR 89 | Resp 18

## 2022-12-26 DIAGNOSIS — C160 Malignant neoplasm of cardia: Secondary | ICD-10-CM

## 2022-12-26 DIAGNOSIS — Z5111 Encounter for antineoplastic chemotherapy: Secondary | ICD-10-CM | POA: Diagnosis not present

## 2022-12-26 MED ORDER — HEPARIN SOD (PORK) LOCK FLUSH 100 UNIT/ML IV SOLN
500.0000 [IU] | Freq: Once | INTRAVENOUS | Status: AC | PRN
  Administered 2022-12-26: 500 [IU]
  Filled 2022-12-26: qty 5

## 2022-12-26 MED ORDER — SODIUM CHLORIDE 0.9% FLUSH
10.0000 mL | INTRAVENOUS | Status: DC | PRN
  Administered 2022-12-26: 10 mL
  Filled 2022-12-26: qty 10

## 2022-12-28 ENCOUNTER — Other Ambulatory Visit: Payer: Self-pay | Admitting: Oncology

## 2022-12-28 DIAGNOSIS — C160 Malignant neoplasm of cardia: Secondary | ICD-10-CM

## 2022-12-30 ENCOUNTER — Ambulatory Visit: Payer: Medicaid Other | Admitting: Oncology

## 2022-12-30 ENCOUNTER — Ambulatory Visit: Payer: Medicaid Other | Admitting: Physical Therapy

## 2022-12-30 ENCOUNTER — Ambulatory Visit: Payer: Medicaid Other

## 2022-12-30 ENCOUNTER — Other Ambulatory Visit: Payer: Medicaid Other

## 2022-12-30 ENCOUNTER — Encounter: Payer: Self-pay | Admitting: Oncology

## 2022-12-31 ENCOUNTER — Ambulatory Visit
Admission: RE | Admit: 2022-12-31 | Discharge: 2022-12-31 | Disposition: A | Discharge status: Home / Self Care | Payer: Medicaid Other | Source: Ambulatory Visit | Attending: Oncology | Admitting: Oncology

## 2022-12-31 DIAGNOSIS — C160 Malignant neoplasm of cardia: Secondary | ICD-10-CM | POA: Insufficient documentation

## 2022-12-31 DIAGNOSIS — C169 Malignant neoplasm of stomach, unspecified: Secondary | ICD-10-CM | POA: Diagnosis not present

## 2022-12-31 LAB — GLUCOSE, CAPILLARY: Glucose-Capillary: 95 mg/dL (ref 70–99)

## 2022-12-31 MED ORDER — FLUDEOXYGLUCOSE F - 18 (FDG) INJECTION
12.0400 | Freq: Once | INTRAVENOUS | Status: AC | PRN
  Administered 2022-12-31: 12.04 via INTRAVENOUS

## 2023-01-01 ENCOUNTER — Ambulatory Visit: Payer: Medicaid Other | Admitting: Physical Therapy

## 2023-01-01 ENCOUNTER — Encounter: Payer: Self-pay | Admitting: Family Medicine

## 2023-01-01 ENCOUNTER — Ambulatory Visit: Payer: Medicaid Other | Admitting: Family Medicine

## 2023-01-01 ENCOUNTER — Encounter: Payer: Self-pay | Admitting: Oncology

## 2023-01-01 VITALS — BP 169/91 | HR 69 | Ht 71.0 in | Wt 187.5 lb

## 2023-01-01 DIAGNOSIS — Z09 Encounter for follow-up examination after completed treatment for conditions other than malignant neoplasm: Secondary | ICD-10-CM

## 2023-01-01 DIAGNOSIS — Z8673 Personal history of transient ischemic attack (TIA), and cerebral infarction without residual deficits: Secondary | ICD-10-CM

## 2023-01-01 DIAGNOSIS — I10 Essential (primary) hypertension: Secondary | ICD-10-CM | POA: Diagnosis not present

## 2023-01-01 NOTE — Patient Instructions (Signed)
Please continue to check your blood pressures at least once daily, making sure to be sitting for 5 to 10 minutes prior to checking, with your feet flat on the floor and your arm at heart level, while not talking or thinking about stressful topics.

## 2023-01-01 NOTE — Progress Notes (Signed)
Established patient visit   Patient: Jeremiah Vance   DOB: 05/12/1970   52 y.o. Male  MRN: [id number] Visit Date: 01/01/2023  Today's healthcare provider: Sherlyn Hay, DO   Chief Complaint  Patient presents with   Medical Management of Chronic Issues    6 week follow up, no new concerns today    Subjective    HPI His aunt came with him today  On Eliquis 2.5 bid d/t gi bleed Sees neurology tomorrow 8am Had an additional stroke - recently hospitalized; went to hospital for decrease in speech; improving now.  No records available for review. Has two more rehab appoinments at the house  Uses rollator at baseline (with recent transition from walker to rollator) Has had 5 strokes Has cancer  Patient reports his blood pressures are always elevated in clinic     Medications: Outpatient Medications Prior to Visit  Medication Sig   acetaminophen (TYLENOL) 325 MG tablet Take 2 tablets (650 mg total) by mouth every 4 (four) hours as needed for mild pain (or temp > 37.5 C (99.5 F)).   apixaban (ELIQUIS) 2.5 MG TABS tablet Take 1 tablet (2.5 mg total) by mouth 2 (two) times daily.   atorvastatin (LIPITOR) 40 MG tablet Take 1 tablet (40 mg total) by mouth daily at 6 PM.   busPIRone (BUSPAR) 7.5 MG tablet Take 1 tablet (7.5 mg total) by mouth 2 (two) times daily.   Continuous Glucose Sensor (FREESTYLE LIBRE 2 SENSOR) MISC Place 1 sensor on the skin every 14 days. Use to check glucose continuously (Patient not taking: Reported on 01/02/2023)   dapagliflozin propanediol (FARXIGA) 10 MG TABS tablet Take 1 tablet (10 mg total) by mouth daily.   dexamethasone (DECADRON) 4 MG tablet TAKE 2 TABLETS(8 MG) BY MOUTH DAILY. START THE DAY AFTER CHEMOTHERAPY FOR 2 DAYS. TAKE WITH FOOD   diclofenac Sodium (VOLTAREN) 1 % GEL Apply 4 g topically 4 (four) times daily. (Patient not taking: Reported on 01/17/2023)   gabapentin (NEURONTIN) 100 MG capsule Take 2 capsules (200 mg total) by mouth 2  (two) times daily. (Patient not taking: Reported on 01/17/2023)   hydrALAZINE (APRESOLINE) 25 MG tablet Take 1 tablet (25 mg total) by mouth every 8 (eight) hours.   ondansetron (ZOFRAN-ODT) 8 MG disintegrating tablet Take 1 tablet (8 mg total) by mouth every 8 (eight) hours as needed for nausea or vomiting. (Patient not taking: Reported on 01/17/2023)   pantoprazole (PROTONIX) 40 MG tablet Take 1 tablet (40 mg total) by mouth 2 (two) times daily before a meal.   prochlorperazine (COMPAZINE) 10 MG tablet Take 1 tablet (10 mg total) by mouth every 6 (six) hours as needed for nausea or vomiting. (Patient not taking: Reported on 01/17/2023)   scopolamine (TRANSDERM-SCOP) 1 MG/3DAYS Place 1 patch (1.5 mg total) onto the skin every 3 (three) days. (Patient not taking: Reported on 01/02/2023)   sertraline (ZOLOFT) 50 MG tablet Take 1 tablet (50 mg total) by mouth daily.   torsemide (DEMADEX) 10 MG tablet Take 1 tablet (10 mg total) by mouth daily as needed (lower ext swelling). (Patient not taking: Reported on 01/17/2023)   [DISCONTINUED] lamoTRIgine (LAMICTAL) 25 MG tablet Take one tablet twice a day for 14 days, then one tablet each morning and two tablets at night for 7 days, then two tablets in the morning and 3 tablets at night for 7 days, then as directed by neurologist   [DISCONTINUED] oxyCODONE (OXY IR/ROXICODONE) 5 MG immediate  release tablet Take 5 mg by mouth every 4 (four) hours as needed for severe pain.   No facility-administered medications prior to visit.    Review of Systems  Constitutional:  Negative for chills and fever.  Respiratory: Negative.  Negative for cough, shortness of breath and wheezing.   Cardiovascular:  Negative for chest pain, palpitations and leg swelling.  Gastrointestinal:  Negative for abdominal pain, diarrhea, nausea and vomiting.  Neurological:  Positive for dizziness and numbness (left side, fingers and occasionally legs; unchanged from hospital). Negative for  weakness, light-headedness and headaches.        Objective    BP (!) 169/91 (BP Location: Right Arm, Patient Position: Sitting, Cuff Size: Normal)   Pulse 69   Ht 5\' 11"  (1.803 m)   Wt 187 lb 8 oz (85 kg)   BMI 26.15 kg/m     Physical Exam Constitutional:      Appearance: Normal appearance.  HENT:     Head: Normocephalic and atraumatic.  Eyes:     General: No scleral icterus.    Extraocular Movements: Extraocular movements intact.     Conjunctiva/sclera: Conjunctivae normal.  Cardiovascular:     Rate and Rhythm: Normal rate and regular rhythm.     Pulses: Normal pulses.     Heart sounds: Normal heart sounds.  Pulmonary:     Effort: Pulmonary effort is normal. No respiratory distress.     Breath sounds: Normal breath sounds.  Abdominal:     General: Bowel sounds are normal. There is no distension.     Palpations: Abdomen is soft. There is no mass.     Tenderness: There is no abdominal tenderness. There is no guarding.  Musculoskeletal:     Right lower leg: No edema.     Left lower leg: No edema.  Skin:    General: Skin is warm and dry.  Neurological:     Mental Status: He is alert and oriented to person, place, and time. Mental status is at baseline.     Motor: Weakness present.     Comments: Strength 5/5 on right and 4/5 throughout left  Psychiatric:        Mood and Affect: Mood normal.        Behavior: Behavior normal.      No results found for any visits on 01/01/23.  Assessment & Plan    Hospital discharge follow-up  History of stroke Assessment & Plan: Patient doing well and continuing to improve since his most recent stroke.  He follows with neurology tomorrow. No acute concerns; will continue to monitor.   Essential hypertension Assessment & Plan: Continue hydralazine 25 mg 3 times daily Patient will be checking his blood pressures at home, keeping a log and bring them into his next visit, as well as his blood pressure cuff for comparison to the  clinic cuff.     Return in about 2 months (around 03/03/2023) for DM, HTN.      I discussed the assessment and treatment plan with the patient  The patient was provided an opportunity to ask questions and all were answered. The patient agreed with the plan and demonstrated an understanding of the instructions.   The patient was advised to call back or seek an in-person evaluation if the symptoms worsen or if the condition fails to improve as anticipated.    Sherlyn Hay, DO  [facility] [phone] (phone) [phone] (fax)  [facility]

## 2023-01-02 ENCOUNTER — Ambulatory Visit: Payer: Medicaid Other | Admitting: Neurology

## 2023-01-02 ENCOUNTER — Encounter: Payer: Self-pay | Admitting: Neurology

## 2023-01-02 VITALS — BP 159/95 | HR 81 | Ht 71.0 in | Wt 248.6 lb

## 2023-01-02 DIAGNOSIS — R200 Anesthesia of skin: Secondary | ICD-10-CM | POA: Diagnosis not present

## 2023-01-02 DIAGNOSIS — I639 Cerebral infarction, unspecified: Secondary | ICD-10-CM

## 2023-01-02 DIAGNOSIS — G5622 Lesion of ulnar nerve, left upper limb: Secondary | ICD-10-CM

## 2023-01-02 DIAGNOSIS — R29898 Other symptoms and signs involving the musculoskeletal system: Secondary | ICD-10-CM

## 2023-01-02 DIAGNOSIS — R202 Paresthesia of skin: Secondary | ICD-10-CM | POA: Diagnosis not present

## 2023-01-02 DIAGNOSIS — C160 Malignant neoplasm of cardia: Secondary | ICD-10-CM | POA: Diagnosis not present

## 2023-01-02 NOTE — Progress Notes (Signed)
[facility] [address] [place].  [address] [phone]       OFFICE FOLLOW-UP VISIT NOTE  [name] Date of Birth:  [birth year] Medical Record Number:  [id number]   Referring MD: Thad Ranger   Reason for Referral: Stroke  HPI: Initial visit 02/27/2022 [name] is a 52 year old Caucasian male seen today for initial office consultation visit for intracerebral hemorrhage and lacunar strokes.  History is obtained from the patient and review of electronic medical records and I personally reviewed pertinent available imaging films in PACS.Jeremiah Vance is a 52 y.o. male with a hx of medication noncompliance, HTN, morbid obesity, chronic systolic CHF, HFimpEF, poorly controlled diabetes Type 2, stroke x3, ICH August 2023, OSA noncompliant with CPAP, chronic lower extremity edema, and CKD stage III Admitted in August 2023 for headache, dizziness, and slurred speech. CT head showed small acute hemorrhage. BP in the ER was high and he was started on nicardipine infusion with improvement of BP. He was transitioned to irbesartan, Coreg and Hydralazine. UDS positive for tricyclic antidepressant. ASA was started at d/c. Echo showed LVEF 30-35%, G1DD, He was diuresed and sent home on torsemide 40mg  daily. LHC was not performed during admission due to CKD and small ICH.  He went to the ED 11/29/21 for chest pain. Vitals were normal. HS troponin was minimally elevated to 44. Ddimer was elevated, CT chest was negative. He was offered admission, but declined.   Seen 12/17/21 and reported dizziness. BP was low and Hydralazine was stopped. For reduced EF, a Myoview Lexsican was ordered.Follow-up labs showed worsening Scr/BUN and irbesartan was stopped, and hydralazine was restarted.  Myoview Lexiscan showed normal perfusion without significant ischemia or scar, normal LVEF, minimal coronary artery calcification, overall low risk.  He was admitted on 01/16/2022 for  CVA.presented to the ER for evaluation after his family noted him to be having speech difficulties somewhere around 4 PM yesterday. No clear last known well was discernible-Per the ER notes, it was 6:30 AM on 01/15/2022 presumably his family was trying to reach him but he would not respond and when they checked on him, he appeared confused and was unable to talk normally. He also reports worsening of the left arm weakness which has been present since the last stroke in August . MRI of the brain showed new acute ischemic infarcts up to 6.6 cm each involving the subcortical left frontal and posterior right frontal lobe, no associated hemorrhage or mass effect.  Multiple scattered chronic hemorrhages distribution most consistent with chronic poorly controlled hypertension.  Neurology was consulted.  MRA of the head showed no emergent LVO, posterior circulation atherosclerosis with severe left P1 and P3 stenosis compared to prior CTA, no aneurysm.  Carotid ultrasound showed no significant stenosis in the anterior circulation, antegrade vertebral flow bilaterally.Echo 01/16/22 showed LVEF 55-60%, no WMA, mild LVH, G1DD, normal RVSF, mild dilation of the ascending aorta, no shunt. He was discharged with ASA given ICH.  The patient was discharged with a 30-day heart monitor.  Patient states he has made some improvement but still has some occasional word finding difficulties.  His swallowing function has improved though he has a swallow test scheduled for a few weeks later.  Speech is much improved.  Still has some weakness in the left side and grip as well as in his left leg while walking.  Can walk independently but uses a walker for long distances.  His blood pressure is under good control today it is  131/80.  States his sugars are doing much better.  Currently getting home physical and occupational therapy 2 days a week.  Update 09/11/2022 : He returns for follow-up after last visit with me 6 months ago.  He was seen  in the ER on 05/20/2022 by Dr. Jerrell Belfast neurohospitalist for sudden onset of slurred speech and increased left-sided weakness.  MRI scan of the brain was obtained which was negative for any acute abnormality.  Patient is very thin with that may have been related to increased anxiety.  Patient remains on Eliquis which is tolerating well without bruising or bleeding.  He is tolerating Lipitor well without muscle aches and pains.  He did have lipid profile checked in his primary care physician's office 7 months ago which was okay.  Hemoglobin A1c on 07/24/2022 was optimal at 6.1.  He is currently participating in outpatient physical and Occupational Therapy at [facility].  He can walk short distances by himself but uses a walker for long distances.  He has not had any falls or injuries.  He had an endoscopy yesterday and unfortunately was diagnosed with a gastric mass and the biopsy result is pending.  Update 01/02/2023 : Patient returns for follow-up after last visit 4 months ago.  He is referred back to see me following ER visit on 11/21/2022.  He presented with sudden onset of tingling and numbness in the left hand for the last few days.  He also reported some difficulty walking.  He is not sure if this is related to his pain.  He had an MRI of the brain which showed a subacute left frontal opercular infarct with trace hemorrhagic transformation.  Patient however denied any corresponding symptoms on the right face or body to go over this and this was felt to be clinically silent.  MR angiogram of the brain and neck showed no significant large vessel stenosis or occlusion.  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  He is currently being seen by oncology and getting chemotherapy.  Patient's anticoagulation had been held prior to the August ER visit due to bleeding risk on chemotherapy and number cytopenia.  He was restarted on  low-dose Eliquis 2.5 twice daily since then and is tolerating it well without bruising or bleeding.  She has had no further stroke or TIA symptoms.  He complains of tingling and numbness mostly in the ulnar 2 fingers.  He does admit to sleeping on his side a lot.  Tingling numbness does not involve the forearm.  Denies significant neck pain or radicular pain. ROS:   14 system review of systems is positive for nausea, vomiting, abdominal pain, weakness, numbness, imbalance, walking difficulty, poor vision and all other systems negative  PMH:  Past Medical History:  Diagnosis Date   Acute ischemic stroke (HCC) 09/06/2017   Acute renal failure superimposed on stage 3a chronic kidney disease (HCC) 07/08/2019   Acute right-sided weakness    Allergies    Anasarca    Anemia 07/10/2017   Arthritis    Cancer (HCC)    stomach and esophageal cancer stage 4   CHF (congestive heart failure) (HCC)    "coinsided w/kidney problems I was having 06/2017"   Chicken pox    CKD (chronic kidney disease) stage 3, GFR 30-59 ml/min (HCC) 06/2017   Depression    Elevated troponin 07/08/2019   High cholesterol    History of cardiomyopathy    LVEF 40  to 45% in April 2019 - subsequently normalized   Hyperbilirubinemia 07/10/2017   Hypertension    Ischemic stroke (HCC)    Small left internal capsule infarct due to lacunar disease   Morbid obesity (HCC)    Normocytic anemia 12/16/2017   Recurrent incisional hernia with incarceration s/p repair 10/22/2017 10/21/2017   Stroke (HCC) 08/2017   "right sided weakness since; getting stronger though" (10/22/2017)   Type 2 diabetes mellitus (HCC)     Social History:  Social History   Socioeconomic History   Marital status: Divorced    Spouse name: Not on file   Number of children: Not on file   Years of education: Not on file   Highest education level: Not on file  Occupational History   Not on file  Tobacco Use   Smoking status: Never   Smokeless tobacco:  Never  Vaping Use   Vaping status: Never Used  Substance and Sexual Activity   Alcohol use: Yes    Comment: occasional beer   Drug use: Never   Sexual activity: Not Currently  Other Topics Concern   Not on file  Social History Narrative   He lives with his sister , [name] and she is his MPOA after his stokes   Social Determinants of Health   Financial Resource Strain: High Risk (03/20/2020)   Overall Financial Resource Strain (CARDIA)    Difficulty of Paying Living Expenses: Hard  Food Insecurity: No Food Insecurity (11/27/2022)   Hunger Vital Sign    Worried About Running Out of Food in the Last Year: Never true    Ran Out of Food in the Last Year: Never true  Transportation Needs: No Transportation Needs (11/27/2022)   PRAPARE - Administrator, Civil Service (Medical): No    Lack of Transportation (Non-Medical): No  Recent Concern: Transportation Needs - Unmet Transportation Needs (09/27/2022)   PRAPARE - Administrator, Civil Service (Medical): No    Lack of Transportation (Non-Medical): Yes  Physical Activity: Not on file  Stress: Not on file  Social Connections: Not on file  Intimate Partner Violence: Not At Risk (11/27/2022)   Humiliation, Afraid, Rape, and Kick questionnaire    Fear of Current or Ex-Partner: No    Emotionally Abused: No    Physically Abused: No    Sexually Abused: No    Medications:   Current Outpatient Medications on File Prior to Visit  Medication Sig Dispense Refill   acetaminophen (TYLENOL) 325 MG tablet Take 2 tablets (650 mg total) by mouth every 4 (four) hours as needed for mild pain (or temp > 37.5 C (99.5 F)).     apixaban (ELIQUIS) 2.5 MG TABS tablet Take 1 tablet (2.5 mg total) by mouth 2 (two) times daily. 60 tablet 2   atorvastatin (LIPITOR) 40 MG tablet Take 1 tablet (40 mg total) by mouth daily at 6 PM. 90 tablet 3   busPIRone (BUSPAR) 7.5 MG tablet Take 1 tablet (7.5 mg total) by mouth 2 (two) times daily. 180  tablet 1   dapagliflozin propanediol (FARXIGA) 10 MG TABS tablet Take 1 tablet (10 mg total) by mouth daily. 30 tablet 0   dexamethasone (DECADRON) 4 MG tablet TAKE 2 TABLETS(8 MG) BY MOUTH DAILY. START THE DAY AFTER CHEMOTHERAPY FOR 2 DAYS. TAKE WITH FOOD 30 tablet 1   diclofenac Sodium (VOLTAREN) 1 % GEL Apply 4 g topically 4 (four) times daily.     gabapentin (NEURONTIN) 100 MG capsule Take 2  capsules (200 mg total) by mouth 2 (two) times daily. 120 capsule 0   hydrALAZINE (APRESOLINE) 25 MG tablet Take 1 tablet (25 mg total) by mouth every 8 (eight) hours. 90 tablet 0   lamoTRIgine (LAMICTAL) 25 MG tablet Take one tablet twice a day for 14 days, then one tablet each morning and two tablets at night for 7 days, then two tablets in the morning and 3 tablets at night for 7 days, then as directed by neurologist 84 tablet 0   ondansetron (ZOFRAN-ODT) 8 MG disintegrating tablet Take 1 tablet (8 mg total) by mouth every 8 (eight) hours as needed for nausea or vomiting. 60 tablet 1   oxyCODONE (OXY IR/ROXICODONE) 5 MG immediate release tablet Take 5 mg by mouth every 4 (four) hours as needed for severe pain.     pantoprazole (PROTONIX) 40 MG tablet Take 1 tablet (40 mg total) by mouth 2 (two) times daily before a meal. 60 tablet 3   prochlorperazine (COMPAZINE) 10 MG tablet Take 1 tablet (10 mg total) by mouth every 6 (six) hours as needed for nausea or vomiting. 60 tablet 0   sertraline (ZOLOFT) 50 MG tablet Take 1 tablet (50 mg total) by mouth daily. 30 tablet 3   torsemide (DEMADEX) 10 MG tablet Take 1 tablet (10 mg total) by mouth daily as needed (lower ext swelling). 30 tablet 0   Continuous Glucose Sensor (FREESTYLE LIBRE 2 SENSOR) MISC Place 1 sensor on the skin every 14 days. Use to check glucose continuously (Patient not taking: Reported on 01/02/2023) 2 each 11   scopolamine (TRANSDERM-SCOP) 1 MG/3DAYS Place 1 patch (1.5 mg total) onto the skin every 3 (three) days. (Patient not taking: Reported  on 01/02/2023) 4 patch 0   No current facility-administered medications on file prior to visit.    Allergies:   Allergies  Allergen Reactions   Pollen Extract Other (See Comments)   Sulfa Antibiotics     Physical Exam General: Morbidly obese pleasant middle-age Caucasian male, seated, in no evident distress Head: head normocephalic and atraumatic.   Neck: supple with no carotid or supraclavicular bruits Cardiovascular: regular rate and rhythm, no murmurs Musculoskeletal: no deformity Skin:  no rash/petichiae Vascular:  Normal pulses all extremities  Neurologic Exam Mental Status: Awake and fully alert. Oriented to place and time. Recent and remote memory intact. Attention span, concentration and fund of knowledge appropriate. Mood and affect appropriate.  Cranial Nerves: Fundoscopic exam not done pupils equal, irregular and sluggishly reactive to light.  Patient is legally blind with left eye only motion detection and right eye finger counting at 2 feet extraocular movements full without nystagmus. Visual fields full to confrontation. Hearing intact. Facial sensation intact. Face, tongue, palate moves normally and symmetrically.  Motor: Mild spastic left hemiparesis with 4/5 strength with weakness of left grip intrinsic hand muscles as well as left ankle dorsiflexors and hip flexors.  Wasting of the left thenar eminence and significant weakness of intrinsic hand muscles in the left hand.  Tone is increased on the left compared to the right.. Sensory.:  Diminished touch and pinprick sensation in the left noted to digits.  Normal position vibration sensation.  Tinel's sign is positive over the left ulnar groove. Coordination: Rapid alternating movements normal in all extremities. Finger-to-nose and heel-to-shin performed accurately bilaterally. Gait and Station: Arises from chair without difficulty. Stance is broad-based.  Walks with some dragging of the left leg.  Using a walker unsteady on  a narrow base and by  standing on either foot unsupported  Reflexes: 1+ and symmetric and ankle jerks are depressed bilaterally. Toes downgoing.     ASSESSMENT: 52 year old Caucasian male with right basal ganglia hemorrhagic infarct in August 2023 as well as bilateral lacunar infarcts in October 2023 due to small vessel disease.  Recent diagnosis of paroxysmal A-fib on cardiac monitoring.  Vascular risk factors of diabetes, hypertension, hyperlipidemia, obesity, congestive heart failure and at risk for sleep apnea.  Recent diagnosis of gastroesophageal junction adenocarcinoma with metastasis and stage IV on chemotherapy.  Recent diagnosis of silent hemorrhagic left frontal infarct while off anticoagulation for anemia and bleeding.  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  I recommend he start sleeping using a pillow beneath his left elbow and wear left elbow support.  Check EMG nerve conduction study.  Continue Eliquis for stroke prevention given history of paroxysmal A-fib and likely hypercoagulability from his adenocarcinoma as well as aggressive risk factor modification.[name] Kitchen  His recent MRI shows a silent left frontal infarct likely from his A-fib since he was off anticoagulation for a short while due to anemia and bleeding risk.  Follow-up with his oncologist for ongoing chemotherapy and treatment of his adenocarcinoma.  Return for follow-up in 6 months or call earlier if necessary. Greater than 50% time during this 40-minute   visit was spent on counseling and coordination of care about his recent atrial fibrillation, hemorrhagic stroke, lacunar strokes and answering questions.  Delia Heady, MD Note: This document was prepared with digital dictation and possible smart phrase  technology. Any transcriptional errors that result from this process are unintentional.

## 2023-01-02 NOTE — Patient Instructions (Signed)
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  I recommend he start sleeping using a pillow beneath his left elbow and wear left elbow support.  Check EMG nerve conduction study.  Continue Eliquis for stroke prevention given history of paroxysmal A-fib and likely hypercoagulability from his adenocarcinoma as well as aggressive risk factor modification.[name] Kitchen  His recent MRI shows a silent left frontal infarct likely from his A-fib since he was off anticoagulation for a short while due to anemia and bleeding risk.  Follow-up with his oncologist for ongoing chemotherapy and treatment of his adenocarcinoma.  Return for follow-up in 6 months or call earlier if necessary.

## 2023-01-04 ENCOUNTER — Emergency Department: Payer: Medicaid Other

## 2023-01-04 ENCOUNTER — Other Ambulatory Visit: Payer: Self-pay

## 2023-01-04 DIAGNOSIS — N183 Chronic kidney disease, stage 3 unspecified: Secondary | ICD-10-CM | POA: Insufficient documentation

## 2023-01-04 DIAGNOSIS — I509 Heart failure, unspecified: Secondary | ICD-10-CM | POA: Diagnosis not present

## 2023-01-04 DIAGNOSIS — S4992XA Unspecified injury of left shoulder and upper arm, initial encounter: Secondary | ICD-10-CM | POA: Diagnosis present

## 2023-01-04 DIAGNOSIS — Z7901 Long term (current) use of anticoagulants: Secondary | ICD-10-CM | POA: Diagnosis not present

## 2023-01-04 DIAGNOSIS — I130 Hypertensive heart and chronic kidney disease with heart failure and stage 1 through stage 4 chronic kidney disease, or unspecified chronic kidney disease: Secondary | ICD-10-CM | POA: Diagnosis not present

## 2023-01-04 DIAGNOSIS — S42032A Displaced fracture of lateral end of left clavicle, initial encounter for closed fracture: Secondary | ICD-10-CM | POA: Diagnosis not present

## 2023-01-04 DIAGNOSIS — E1122 Type 2 diabetes mellitus with diabetic chronic kidney disease: Secondary | ICD-10-CM | POA: Diagnosis not present

## 2023-01-04 DIAGNOSIS — W182XXA Fall in (into) shower or empty bathtub, initial encounter: Secondary | ICD-10-CM | POA: Diagnosis not present

## 2023-01-04 DIAGNOSIS — S42035A Nondisplaced fracture of lateral end of left clavicle, initial encounter for closed fracture: Secondary | ICD-10-CM | POA: Diagnosis not present

## 2023-01-04 DIAGNOSIS — S4980XA Other specified injuries of shoulder and upper arm, unspecified arm, initial encounter: Secondary | ICD-10-CM | POA: Diagnosis not present

## 2023-01-04 DIAGNOSIS — W19XXXA Unspecified fall, initial encounter: Secondary | ICD-10-CM | POA: Diagnosis not present

## 2023-01-04 NOTE — ED Triage Notes (Addendum)
BIB [facility] from home. Pt reports fell in the shower. Denies hitting head or LOC. No daily thinners. L arm and shoulder  pain reported. No obvious deformity. Pt alert and oriented.   EMS VS:  HR 85 98% RA 177/102

## 2023-01-05 ENCOUNTER — Emergency Department
Admission: EM | Admit: 2023-01-05 | Discharge: 2023-01-05 | Disposition: A | Discharge status: Home / Self Care | Payer: Medicaid Other | Attending: Emergency Medicine | Admitting: Emergency Medicine

## 2023-01-05 DIAGNOSIS — S42035A Nondisplaced fracture of lateral end of left clavicle, initial encounter for closed fracture: Secondary | ICD-10-CM

## 2023-01-05 MED ORDER — KETOROLAC TROMETHAMINE 60 MG/2ML IM SOLN
30.0000 mg | Freq: Once | INTRAMUSCULAR | Status: AC
  Administered 2023-01-05: 30 mg via INTRAMUSCULAR
  Filled 2023-01-05: qty 2

## 2023-01-05 MED ORDER — OXYCODONE HCL 5 MG PO TABS
5.0000 mg | ORAL_TABLET | Freq: Once | ORAL | Status: AC
  Administered 2023-01-05: 5 mg via ORAL
  Filled 2023-01-05: qty 1

## 2023-01-05 NOTE — Discharge Instructions (Signed)
You may take Ibuprofen as needed for pain, your oxycodone as needed for more severe pain.  Wear sling as needed for comfort.  Return to the ER for worsening symptoms, persistent vomiting, difficulty breathing or other concerns.

## 2023-01-05 NOTE — ED Provider Notes (Signed)
[facility] Provider Note    Event Date/Time   First MD Initiated Contact with Patient 01/05/23 [phone]     (approximate)   History   Fall, shoulder pain   HPI  Jeremiah Vance is a 52 y.o. male brought to the ED via EMS from home status post mechanical fall while in the shower with left shoulder pain.  Patient is right-hand dominant.  Denies striking head or LOC.  Voices no other complaints or injuries.     Past Medical History   Past Medical History:  Diagnosis Date   Acute ischemic stroke (HCC) 09/06/2017   Acute renal failure superimposed on stage 3a chronic kidney disease (HCC) 07/08/2019   Acute right-sided weakness    Allergies    Anasarca    Anemia 07/10/2017   Arthritis    Cancer (HCC)    stomach and esophageal cancer stage 4   CHF (congestive heart failure) (HCC)    "coinsided w/kidney problems I was having 06/2017"   Chicken pox    CKD (chronic kidney disease) stage 3, GFR 30-59 ml/min (HCC) 06/2017   Depression    Elevated troponin 07/08/2019   High cholesterol    History of cardiomyopathy    LVEF 40 to 45% in April 2019 - subsequently normalized   Hyperbilirubinemia 07/10/2017   Hypertension    Ischemic stroke (HCC)    Small left internal capsule infarct due to lacunar disease   Morbid obesity (HCC)    Normocytic anemia 12/16/2017   Recurrent incisional hernia with incarceration s/p repair 10/22/2017 10/21/2017   Stroke (HCC) 08/2017   "right sided weakness since; getting stronger though" (10/22/2017)   Type 2 diabetes mellitus (HCC)      Active Problem List   Patient Active Problem List   Diagnosis Date Noted   Ischemic cerebrovascular accident (CVA) of frontal lobe (HCC) 11/27/2022   PAF (paroxysmal atrial fibrillation) (HCC) 11/27/2022   Diabetes mellitus (HCC) 11/27/2022   Class 1 obesity with serious comorbidity in adult 11/27/2022   Benign essential HTN 11/27/2022   Acute CVA (cerebrovascular accident) (HCC)  11/22/2022   Stroke (HCC) 11/21/2022   Depression with anxiety 11/21/2022   Obesity (BMI 30-39.9) 11/21/2022   Hypomagnesemia 11/21/2022   Mood disorder (HCC) 10/29/2022   Type 2 diabetes mellitus with hyperglycemia, without long-term current use of insulin (HCC) 10/29/2022   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   Encounter for antineoplastic chemotherapy 10/02/2022   Chronic combined systolic and diastolic congestive heart failure (HCC) 09/27/2022   Chronic kidney disease, stage 3b (HCC) 09/27/2022   Type II diabetes mellitus with renal manifestations (HCC) 09/27/2022   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   Goals of care, counseling/discussion 09/19/2022   Anxiety associated with cancer diagnosis (HCC) 09/19/2022   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   IDA (iron deficiency anemia) 09/09/2022   Paroxysmal atrial fibrillation (HCC) 09/08/2022   Dysarthria 05/20/2022   Dyslipidemia 01/16/2022   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    HFrEF (heart failure with reduced ejection fraction) (HCC)    Other cirrhosis of liver (HCC)    History of stroke 10/17/2019   CKD (chronic kidney disease) stage 3, GFR 30-59 ml/min (HCC) 08/31/2019   DM (diabetes mellitus), type 2 with complications (HCC) 07/08/2019   Moderate obstructive sleep apnea 02/11/2018   Chronic fatigue 12/16/2017   Essential hypertension 12/16/2017   History of small bowel obstruction 10/24/2017   AKI (acute kidney injury) (HCC)    Hyperlipidemia  Gout      Past Surgical History   Past Surgical History:  Procedure Laterality Date   ABDOMINAL HERNIA REPAIR  2008; 10/22/2017   "scope; OPEN REPAIR INCARCERATED VENTRAL HERNIA   ESOPHAGOGASTRODUODENOSCOPY (EGD) WITH PROPOFOL N/A 09/10/2022   Procedure: ESOPHAGOGASTRODUODENOSCOPY (EGD) WITH PROPOFOL;  Surgeon: Toney Reil, MD;  Location: [facility] ENDOSCOPY;  Service: Gastroenterology;  Laterality: N/A;    ESOPHAGOGASTRODUODENOSCOPY (EGD) WITH PROPOFOL N/A 09/28/2022   Procedure: ESOPHAGOGASTRODUODENOSCOPY (EGD) WITH PROPOFOL;  Surgeon: Jaynie Collins, DO;  Location: [facility] ENDOSCOPY;  Service: Gastroenterology;  Laterality: N/A;   HEMOSTASIS CONTROL  09/28/2022   Procedure: HEMOSTASIS CONTROL;  Surgeon: Jaynie Collins, DO;  Location: [facility] ENDOSCOPY;  Service: Gastroenterology;;   HERNIA REPAIR     KNEE ARTHROSCOPY Right 1989   PORTA CATH INSERTION N/A 09/25/2022   Procedure: PORTA CATH INSERTION;  Surgeon: Annice Needy, MD;  Location: [facility] INVASIVE CV LAB;  Service: Cardiovascular;  Laterality: N/A;   VENTRAL HERNIA REPAIR N/A 10/22/2017   Procedure: OPEN REPAIR INCARCERATED VENTRAL HERNIA;  Surgeon: Glenna Fellows, MD;  Location: [facility] OR;  Service: General;  Laterality: N/A;     Home Medications   Prior to Admission medications   Medication Sig Start Date End Date Taking? Authorizing Provider  acetaminophen (TYLENOL) 325 MG tablet Take 2 tablets (650 mg total) by mouth every 4 (four) hours as needed for mild pain (or temp > 37.5 C (99.5 F)). 12/06/22   Setzer, Lynnell Jude, PA-C  apixaban (ELIQUIS) 2.5 MG TABS tablet Take 1 tablet (2.5 mg total) by mouth 2 (two) times daily. 12/16/22   Rickard Patience, MD  atorvastatin (LIPITOR) 40 MG tablet Take 1 tablet (40 mg total) by mouth daily at 6 PM. 10/23/22   Ostwalt, Edmon Crape, PA-C  busPIRone (BUSPAR) 7.5 MG tablet Take 1 tablet (7.5 mg total) by mouth 2 (two) times daily. 10/23/22   Ostwalt, Edmon Crape, PA-C  Continuous Glucose Sensor (FREESTYLE LIBRE 2 SENSOR) MISC Place 1 sensor on the skin every 14 days. Use to check glucose continuously Patient not taking: Reported on 01/02/2023 10/23/22   Debera Lat, PA-C  dapagliflozin propanediol (FARXIGA) 10 MG TABS tablet Take 1 tablet (10 mg total) by mouth daily. 12/07/22   Setzer, Lynnell Jude, PA-C  dexamethasone (DECADRON) 4 MG tablet TAKE 2 TABLETS(8 MG) BY MOUTH DAILY. START THE DAY AFTER CHEMOTHERAPY FOR 2 DAYS.  TAKE WITH FOOD 12/30/22   Rickard Patience, MD  diclofenac Sodium (VOLTAREN) 1 % GEL Apply 4 g topically 4 (four) times daily. 12/06/22   Setzer, Lynnell Jude, PA-C  gabapentin (NEURONTIN) 100 MG capsule Take 2 capsules (200 mg total) by mouth 2 (two) times daily. 12/06/22   Setzer, Lynnell Jude, PA-C  hydrALAZINE (APRESOLINE) 25 MG tablet Take 1 tablet (25 mg total) by mouth every 8 (eight) hours. 12/06/22   Setzer, Lynnell Jude, PA-C  ondansetron (ZOFRAN-ODT) 8 MG disintegrating tablet Take 1 tablet (8 mg total) by mouth every 8 (eight) hours as needed for nausea or vomiting. 10/10/22   Rickard Patience, MD  oxyCODONE (OXY IR/ROXICODONE) 5 MG immediate release tablet Take 5 mg by mouth every 4 (four) hours as needed for severe pain.    [provider]  pantoprazole (PROTONIX) 40 MG tablet Take 1 tablet (40 mg total) by mouth 2 (two) times daily before a meal. 10/29/22   Rickard Patience, MD  prochlorperazine (COMPAZINE) 10 MG tablet Take 1 tablet (10 mg total) by mouth every 6 (six) hours as needed for nausea or vomiting.  10/10/22   Rickard Patience, MD  scopolamine (TRANSDERM-SCOP) 1 MG/3DAYS Place 1 patch (1.5 mg total) onto the skin every 3 (three) days. Patient not taking: Reported on 01/02/2023 09/29/22   Sunnie Nielsen, DO  sertraline (ZOLOFT) 50 MG tablet Take 1 tablet (50 mg total) by mouth daily. 10/29/22   Jacky Kindle, FNP  torsemide (DEMADEX) 10 MG tablet Take 1 tablet (10 mg total) by mouth daily as needed (lower ext swelling). 12/06/22   Setzer, Lynnell Jude, PA-C     Allergies  Pollen extract and Sulfa antibiotics   Family History   Family History  Problem Relation Age of Onset   Diabetes Mother    Stroke Mother    Arthritis Mother    Depression Mother    Heart disease Mother    Hypertension Mother    Learning disabilities Mother    Mental illness Mother    Sleep apnea Mother    Diabetes Father    Heart disease Father    Arthritis Father    Hearing loss Father    Hyperlipidemia Father    Heart attack Father     Hypertension Father    Stroke Father    Diabetes Sister    Depression Sister    Diabetes Sister    Hypertension Sister    Mental illness Sister    Sleep apnea Sister    Diabetes Maternal Grandmother    Heart disease Maternal Grandmother    Depression Maternal Grandmother    Hyperlipidemia Maternal Grandmother    Hypertension Maternal Grandmother    Stroke Maternal Grandmother    Hypertension Maternal Grandfather    Hyperlipidemia Maternal Grandfather    Stroke Maternal Grandfather    Arthritis Paternal Grandmother    Hearing loss Paternal Grandmother    Stroke Paternal Grandfather    Heart disease Paternal Grandfather    Arthritis Paternal Grandfather    Heart attack Paternal Grandfather    Melanoma Other      Physical Exam  Triage Vital Signs: ED Triage Vitals  Encounter Vitals Group     BP 01/04/23 2218 (!) 175/107     Systolic BP Percentile --      Diastolic BP Percentile --      Pulse Rate 01/04/23 2218 67     Resp 01/04/23 2218 18     Temp 01/04/23 2218 98.1 F (36.7 C)     Temp Source 01/04/23 2218 Oral     SpO2 01/04/23 2218 97 %     Weight 01/04/23 2216 245 lb (111.1 kg)     Height 01/04/23 2216 5\' 11"  (1.803 m)     Head Circumference --      Peak Flow --      Pain Score 01/04/23 2215 10     Pain Loc --      Pain Education --      Exclude from Growth Chart --     Updated Vital Signs: BP (!) 175/107 (BP Location: Right Arm)   Pulse 67   Temp 98.1 F (36.7 C) (Oral)   Resp 18   Ht 5\' 11"  (1.803 m)   Wt 111.1 kg   SpO2 97%   BMI 34.17 kg/m    General: Awake, mild distress.  CV:  RRR.  Good peripheral perfusion.  Resp:  Normal effort.  CTAB. Abd:  No distention.  Other:  LUE: No deformity noted.  Distal clavicle tender to palpation.  No tenting noted.  Limited range of motion left shoulder secondary to pain.  2+ radial pulse.  Brisk, less than 5-second cap refill.   ED Results / Procedures / Treatments  Labs (all labs ordered are listed,  but only abnormal results are displayed) Labs Reviewed - No data to display   EKG  None   RADIOLOGY I have independently visualized and interpreted patient's imaging study as well as noted the radiology interpretation:  Left shoulder: Acute, minimally displaced distal clavicular fracture  Official radiology report(s): DG Shoulder Left  Result Date: 01/04/2023 CLINICAL DATA:  Fall, left shoulder pain EXAM: LEFT SHOULDER - 2+ VIEW COMPARISON:  None FINDINGS: There is a acute, minimally displaced fracture of the distal left clavicle distal to the expected coracoclavicular articulation with fracture fragments in near anatomic alignment. Acromioclavicular joint space is preserved. Glenohumeral joint space is preserved. No other fracture identified. No dislocation. Limited evaluation of the left hemithorax is unremarkable. IMPRESSION: 1. Acute, minimally displaced distal left clavicular fracture. Electronically Signed   By: Helyn Numbers M.D.   On: 01/04/2023 23:27     PROCEDURES:  Critical Care performed: No  Procedures   MEDICATIONS ORDERED IN ED: Medications  ketorolac (TORADOL) injection 30 mg (has no administration in time range)  oxyCODONE (Oxy IR/ROXICODONE) immediate release tablet 5 mg (has no administration in time range)     IMPRESSION / MDM / ASSESSMENT AND PLAN / ED COURSE  I reviewed the triage vital signs and the nursing notes.                             52 year old male presenting with fall with left shoulder pain.  Distal clavicle fracture seen on x-ray.  Will place in sling, administer IM Ketorolac now with oxycodone.  Patient has oxycodone available to him at home for cancer pain which she does not currently take.  Advised patient may take that as needed for pain.  He will follow-up with orthopedics as an outpatient.  Strict return precautions given.  Patient and spouse verbalized understanding and agree with plan of care.  Patient's presentation is most  consistent with acute, uncomplicated illness.   FINAL CLINICAL IMPRESSION(S) / ED DIAGNOSES   Final diagnoses:  Closed nondisplaced fracture of acromial end of left clavicle, initial encounter     Rx / DC Orders   ED Discharge Orders     None        Note:  This document was prepared using Dragon voice recognition software and may include unintentional dictation errors.   Irean Hong, MD 01/05/23 [phone]

## 2023-01-06 ENCOUNTER — Ambulatory Visit: Payer: Medicaid Other | Admitting: Physical Therapy

## 2023-01-06 MED FILL — Dexamethasone Sodium Phosphate Inj 100 MG/10ML: INTRAMUSCULAR | Qty: 1 | Status: AC

## 2023-01-07 ENCOUNTER — Other Ambulatory Visit: Payer: Self-pay

## 2023-01-07 ENCOUNTER — Inpatient Hospital Stay: Payer: Medicaid Other

## 2023-01-07 ENCOUNTER — Encounter: Payer: Self-pay | Admitting: Oncology

## 2023-01-07 ENCOUNTER — Inpatient Hospital Stay (HOSPITAL_BASED_OUTPATIENT_CLINIC_OR_DEPARTMENT_OTHER): Payer: Medicaid Other | Admitting: Oncology

## 2023-01-07 ENCOUNTER — Inpatient Hospital Stay: Payer: Medicaid Other | Attending: Oncology

## 2023-01-07 VITALS — BP 158/86 | HR 63 | Resp 18

## 2023-01-07 VITALS — BP 127/70 | HR 92 | Temp 98.2°F | Resp 18 | Wt 256.0 lb

## 2023-01-07 DIAGNOSIS — I4891 Unspecified atrial fibrillation: Secondary | ICD-10-CM | POA: Diagnosis not present

## 2023-01-07 DIAGNOSIS — I6389 Other cerebral infarction: Secondary | ICD-10-CM | POA: Insufficient documentation

## 2023-01-07 DIAGNOSIS — C160 Malignant neoplasm of cardia: Secondary | ICD-10-CM

## 2023-01-07 DIAGNOSIS — F411 Generalized anxiety disorder: Secondary | ICD-10-CM

## 2023-01-07 DIAGNOSIS — Z5111 Encounter for antineoplastic chemotherapy: Secondary | ICD-10-CM | POA: Insufficient documentation

## 2023-01-07 DIAGNOSIS — G620 Drug-induced polyneuropathy: Secondary | ICD-10-CM | POA: Insufficient documentation

## 2023-01-07 DIAGNOSIS — T451X5A Adverse effect of antineoplastic and immunosuppressive drugs, initial encounter: Secondary | ICD-10-CM | POA: Insufficient documentation

## 2023-01-07 DIAGNOSIS — D500 Iron deficiency anemia secondary to blood loss (chronic): Secondary | ICD-10-CM | POA: Diagnosis not present

## 2023-01-07 DIAGNOSIS — Z7901 Long term (current) use of anticoagulants: Secondary | ICD-10-CM | POA: Diagnosis not present

## 2023-01-07 DIAGNOSIS — Z79899 Other long term (current) drug therapy: Secondary | ICD-10-CM | POA: Insufficient documentation

## 2023-01-07 DIAGNOSIS — C801 Malignant (primary) neoplasm, unspecified: Secondary | ICD-10-CM

## 2023-01-07 DIAGNOSIS — C778 Secondary and unspecified malignant neoplasm of lymph nodes of multiple regions: Secondary | ICD-10-CM | POA: Diagnosis not present

## 2023-01-07 DIAGNOSIS — I639 Cerebral infarction, unspecified: Secondary | ICD-10-CM | POA: Diagnosis not present

## 2023-01-07 DIAGNOSIS — I480 Paroxysmal atrial fibrillation: Secondary | ICD-10-CM

## 2023-01-07 DIAGNOSIS — S42009A Fracture of unspecified part of unspecified clavicle, initial encounter for closed fracture: Secondary | ICD-10-CM | POA: Insufficient documentation

## 2023-01-07 DIAGNOSIS — S42002D Fracture of unspecified part of left clavicle, subsequent encounter for fracture with routine healing: Secondary | ICD-10-CM

## 2023-01-07 DIAGNOSIS — D509 Iron deficiency anemia, unspecified: Secondary | ICD-10-CM | POA: Insufficient documentation

## 2023-01-07 DIAGNOSIS — Z808 Family history of malignant neoplasm of other organs or systems: Secondary | ICD-10-CM | POA: Diagnosis not present

## 2023-01-07 DIAGNOSIS — K7469 Other cirrhosis of liver: Secondary | ICD-10-CM | POA: Diagnosis not present

## 2023-01-07 LAB — IRON AND TIBC
Iron: 44 ug/dL — ABNORMAL LOW (ref 45–182)
Saturation Ratios: 15 % — ABNORMAL LOW (ref 17.9–39.5)
TIBC: 287 ug/dL (ref 250–450)
UIBC: 243 ug/dL

## 2023-01-07 LAB — RETIC PANEL
Immature Retic Fract: 13 % (ref 2.3–15.9)
RBC.: 3.77 MIL/uL — ABNORMAL LOW (ref 4.22–5.81)
Retic Count, Absolute: 121.4 10*3/uL (ref 19.0–186.0)
Retic Ct Pct: 3.2 % — ABNORMAL HIGH (ref 0.4–3.1)
Reticulocyte Hemoglobin: 28.8 pg (ref >27.9)

## 2023-01-07 LAB — CBC WITH DIFFERENTIAL (CANCER CENTER ONLY)
Abs Immature Granulocytes: 0.02 10*3/uL (ref 0.00–0.07)
Basophils Absolute: 0 10*3/uL (ref 0.0–0.1)
Basophils Relative: 1 %
Eosinophils Absolute: 0.3 10*3/uL (ref 0.0–0.5)
Eosinophils Relative: 5 %
HCT: 32.4 % — ABNORMAL LOW (ref 39.0–52.0)
Hemoglobin: 10 g/dL — ABNORMAL LOW (ref 13.0–17.0)
Immature Granulocytes: 0 %
Lymphocytes Relative: 19 %
Lymphs Abs: 1 10*3/uL (ref 0.7–4.0)
MCH: 26.6 pg (ref 26.0–34.0)
MCHC: 30.9 g/dL (ref 30.0–36.0)
MCV: 86.2 fL (ref 80.0–100.0)
Monocytes Absolute: 0.7 10*3/uL (ref 0.1–1.0)
Monocytes Relative: 12 %
Neutro Abs: 3.5 10*3/uL (ref 1.7–7.7)
Neutrophils Relative %: 63 %
Platelet Count: 184 10*3/uL (ref 150–400)
RBC: 3.76 MIL/uL — ABNORMAL LOW (ref 4.22–5.81)
RDW: 20.8 % — ABNORMAL HIGH (ref 11.5–15.5)
WBC Count: 5.5 10*3/uL (ref 4.0–10.5)
nRBC: 0 % (ref 0.0–0.2)

## 2023-01-07 LAB — CMP (CANCER CENTER ONLY)
ALT: 17 U/L (ref 0–44)
AST: 19 U/L (ref 15–41)
Albumin: 3.3 g/dL — ABNORMAL LOW (ref 3.5–5.0)
Alkaline Phosphatase: 95 U/L (ref 38–126)
Anion gap: 7 (ref 5–15)
BUN: 29 mg/dL — ABNORMAL HIGH (ref 6–20)
CO2: 22 mmol/L (ref 22–32)
Calcium: 8.5 mg/dL — ABNORMAL LOW (ref 8.9–10.3)
Chloride: 108 mmol/L (ref 98–111)
Creatinine: 1.03 mg/dL (ref 0.61–1.24)
GFR, Estimated: >60 mL/min (ref >60)
Glucose, Bld: 107 mg/dL — ABNORMAL HIGH (ref 70–99)
Potassium: 3.7 mmol/L (ref 3.5–5.1)
Sodium: 137 mmol/L (ref 135–145)
Total Bilirubin: 0.9 mg/dL (ref 0.3–1.2)
Total Protein: 6.9 g/dL (ref 6.5–8.1)

## 2023-01-07 LAB — SAMPLE TO BLOOD BANK

## 2023-01-07 LAB — FERRITIN: Ferritin: 118 ng/mL (ref 24–336)

## 2023-01-07 MED ORDER — SODIUM CHLORIDE 0.9 % IV SOLN
10.0000 mg | Freq: Once | INTRAVENOUS | Status: AC
  Administered 2023-01-07: 10 mg via INTRAVENOUS
  Filled 2023-01-07: qty 10

## 2023-01-07 MED ORDER — DEXTROSE 5 % IV SOLN
Freq: Once | INTRAVENOUS | Status: AC
  Filled 2023-01-07: qty 250

## 2023-01-07 MED ORDER — SODIUM CHLORIDE 0.9 % IV SOLN
2400.0000 mg/m2 | INTRAVENOUS | Status: DC
  Administered 2023-01-07: 5550 mg via INTRAVENOUS
  Filled 2023-01-07: qty 111

## 2023-01-07 MED ORDER — PALONOSETRON HCL INJECTION 0.25 MG/5ML
0.2500 mg | Freq: Once | INTRAVENOUS | Status: AC
  Administered 2023-01-07: 0.25 mg via INTRAVENOUS
  Filled 2023-01-07: qty 5

## 2023-01-07 MED ORDER — OXALIPLATIN CHEMO INJECTION 100 MG/20ML
65.0000 mg/m2 | Freq: Once | INTRAVENOUS | Status: AC
  Administered 2023-01-07: 150 mg via INTRAVENOUS
  Filled 2023-01-07: qty 10

## 2023-01-07 MED ORDER — OXYCODONE HCL 5 MG PO TABS: 5.0000 mg | ORAL_TABLET | Freq: Four times a day (QID) | ORAL | 0 refills | Status: DC | PRN

## 2023-01-07 MED ORDER — LEUCOVORIN CALCIUM INJECTION 350 MG
400.0000 mg/m2 | Freq: Once | INTRAVENOUS | Status: AC
  Administered 2023-01-07: 928 mg via INTRAVENOUS
  Filled 2023-01-07: qty 46.4

## 2023-01-07 NOTE — Assessment & Plan Note (Addendum)
Continue Eliquis 2.5mg BID.  

## 2023-01-07 NOTE — Progress Notes (Signed)
Hematology/Oncology Progress note [phone] [phone]    CHIEF COMPLAINTS/PURPOSE OF CONSULTATION:  GE junction cancer/gastric cardia cancer.  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  PD-L1 TPS<1%, CPS 3, TMB 8.4, MSI can not be accessed. Labs are reviewed and discussed with patient. Proceed with 5-FU +Oxaliplatin 65mg /m2  Repeat PET scan Oct 2024 -good response.    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 MRI of the brain with and without contrast showed subacute infarct in the left frontal operculum with petechial hemorrhage and mild surrounding edema. No significant mass effect or midline shift. MRA negative foir LVO. The MRI findings of left frontal operculum does not seem to explain his left-sided numbness. Neurologist during admission recommend PET brain or follow up with another MRI. 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 Barbaraann Cao for evaluation and management.  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   Other cirrhosis of liver (HCC) LFT is stable.   IDA (iron deficiency anemia) Lab Results  Component Value Date   HGB 10.0 (L) 01/07/2023   TIBC 224 (L) 12/10/2022   IRONPCTSAT 25 12/10/2022   FERRITIN 229 12/10/2022    Hb is trending down. Repeat iron panel today.  Plan additional Venofer treatments, suspect GI blood loss due to resuming on Eliquis.      Anxiety associated with cancer diagnosis (HCC) Recommend Ativan 0.5 mg every 12 hours as needed for anxiety/sleep/nausea  Clavicular fracture Follow up with ortho   Orders Placed This Encounter  Procedures   Ferritin    Standing Status:   Future    Number of Occurrences:   1    Standing Expiration Date:   01/07/2024   Iron and TIBC    Standing Status:   Future    Number of Occurrences:   1    Standing Expiration Date:   01/07/2024   Retic Panel    Standing Status:   Future    Number of Occurrences:   1    Standing Expiration Date:   01/07/2024    2 weeks lab MD FOLFOX  All questions were answered. The patient knows to call the clinic with any problems, questions or concerns.  Rickard Patience, MD, PhD [facility] Hematology Oncology 01/07/2023    HISTORY OF PRESENTING ILLNESS:  Jeremiah Vance 52 y.o. male presents to establish care for GE junction/gastric cardia adenocarcinoma I have reviewed his chart and materials related to his cancer extensively and collaborated history with the patient. Summary of oncologic history is as follows: Oncology History  Gastric cancer (HCC)  09/08/2022 Imaging   CT abdomen pelvis w contrast     09/08/2022 Imaging   CT abdomen pelvis with contrast  Diffuse masslike thickening in the area of the GE junction and upper stomach with the extensive abnormal lymph nodes throughout the abdomen including retrocrural, porta hepatis, retroperitoneum and lesser and greater curve of the stomach. In addition there are areas of suggest peritoneal carcinomatosis. Recommend further evaluation.  No bowel obstruction. Large midline anterior pelvic wall hernia involving small bowel and mesenteric fat with rectus muscle diastasis. Gallstones.   09/19/2022 Cancer Staging   Staging form: Stomach, AJCC 8th Edition - Clinical stage from 09/19/2022: Stage IVB (cTX, cN2, cM1) - Signed by Rickard Patience, MD on 09/19/2022 Stage prefix: Initial diagnosis   09/19/2022 Initial  Diagnosis   Gastric cancer (HCC)  09/08/2022, patient presented emergency room for evaluation of abdominal pain and vomiting.  He has also had weight loss.  Dysphagia with solid food for couple of months.  CT abdomen pelvis showed diffuse masslike thickening of the GE junction and upper stomach with extensive abnormal lymph nodes throughout abdomen.  09/10/2022 upper endoscopy showed malignant appearance gastric tumor at the GE junction and in the cardia Biopsied.  Nodular mucosa in the second portion of duodenum.  Biopsied. GE junction mass showed moderate to poorly differentiated adenocarcinoma undermining squamous mucosa with focal area of squamous atypia.  Cannot exclude adenosquamous carcinoma.  Intestinal metaplasia is not identified. Duodenum cold biopsy-reactive duodenitis.  Negative for dysplasia and malignancy. Stomach random biopsy negative for H. pylori, intestinal metaplasia, dysplasia and malignancy.  Stomach Cardia mass biopsy showed moderate to poorly differentiated adenocarcinoma.  Patient's case was discussed on tumor board on 09/19/2022.  Per Dr. Oneita Kras, nephrology findings of both GE junction mass and gastric cardia mass or similar. HER2 staining negative. PD-L1 TPS<1%, CPS 3, TMB 8.4, MSI can not be accessed.  KMT2D frame shift, GATA6 copy number gain.  Molecular testing is pending.     09/23/2022 Imaging   PET scan showed 1. 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 2. Enlarged cirrhotic liver. 3. Cholelithiasis. 4. Bilateral renal stones. 5. Umbilical hernias contain unobstructed small bowel and/or fat.  6. Aortic atherosclerosis (ICD10-I70.0). Coronary artery calcification. 7. Enlarged pulmonic trunk, indicative of pulmonary arterial hypertension.    10/02/2022 -  Chemotherapy   Patient is on Treatment Plan : GASTROESOPHAGEAL FOLFOX q14d     He lives at home with his sister.  Patient is undergoing neuro physical  therapy. 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 A fib previously on Eliquis, which was held due to severe iron deficiency anemia.  Restarted in August 2024 due to subacute CVA.  11/21/2022 - 11/27/2022, patient was hospitalized.  He initially presented to emergency room due to left sided numbness.  MRI of the brain showed subacute infarct in the left frontal lobe with petechial hemorrhage and mild surrounding edema.  He has no right-sided neurologic deficits.  MRI finding was not explaining left-sided numbness.  He has chronic left sided weakness due to previous CVA.  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  Patient was discharged to rehab and was recently discharged.  INTERVAL HISTORY Jeremiah Vance is a 52 y.o. male who has above history reviewed by me today presents for follow up visit for stage IV GE junction/gastric cancer.   S/p fall, minimally displaced distal left clavicular fracture.  He takes oxycodone for pain and requests refill.  Denies any blood in the stool since the start of anticoagulation.  Appetite is good.  Weight is stable.  He presents for evaluation prior to chemotherapy.  MEDICAL HISTORY:  Past Medical History:  Diagnosis Date   Acute ischemic stroke (HCC) 09/06/2017   Acute renal failure superimposed on stage 3a chronic  kidney disease (HCC) 07/08/2019   Acute right-sided weakness    Allergies    Anasarca    Anemia 07/10/2017   Arthritis    Cancer (HCC)    stomach and esophageal cancer stage 4   CHF (congestive heart failure) (HCC)    "coinsided w/kidney problems I was having 06/2017"   Chicken pox    CKD (chronic kidney disease) stage 3, GFR 30-59 ml/min (HCC) 06/2017   Depression    Elevated troponin 07/08/2019   High cholesterol    History of cardiomyopathy    LVEF 40 to 45%  in April 2019 - subsequently normalized   Hyperbilirubinemia 07/10/2017   Hypertension    Ischemic stroke (HCC)    Small left internal capsule infarct due to lacunar disease   Morbid obesity (HCC)    Normocytic anemia 12/16/2017   Recurrent incisional hernia with incarceration s/p repair 10/22/2017 10/21/2017   Stroke (HCC) 08/2017   "right sided weakness since; getting stronger though" (10/22/2017)   Type 2 diabetes mellitus (HCC)     SURGICAL HISTORY: Past Surgical History:  Procedure Laterality Date   ABDOMINAL HERNIA REPAIR  2008; 10/22/2017   "scope; OPEN REPAIR INCARCERATED VENTRAL HERNIA   ESOPHAGOGASTRODUODENOSCOPY (EGD) WITH PROPOFOL N/A 09/10/2022   Procedure: ESOPHAGOGASTRODUODENOSCOPY (EGD) WITH PROPOFOL;  Surgeon: Toney Reil, MD;  Location: [facility] ENDOSCOPY;  Service: Gastroenterology;  Laterality: N/A;   ESOPHAGOGASTRODUODENOSCOPY (EGD) WITH PROPOFOL N/A 09/28/2022   Procedure: ESOPHAGOGASTRODUODENOSCOPY (EGD) WITH PROPOFOL;  Surgeon: Jaynie Collins, DO;  Location: [facility] ENDOSCOPY;  Service: Gastroenterology;  Laterality: N/A;   HEMOSTASIS CONTROL  09/28/2022   Procedure: HEMOSTASIS CONTROL;  Surgeon: Jaynie Collins, DO;  Location: [facility] ENDOSCOPY;  Service: Gastroenterology;;   HERNIA REPAIR     KNEE ARTHROSCOPY Right 1989   PORTA CATH INSERTION N/A 09/25/2022   Procedure: PORTA CATH INSERTION;  Surgeon: Annice Needy, MD;  Location: [facility] INVASIVE CV LAB;  Service: Cardiovascular;  Laterality: N/A;   VENTRAL HERNIA REPAIR N/A 10/22/2017   Procedure: OPEN REPAIR INCARCERATED VENTRAL HERNIA;  Surgeon: Glenna Fellows, MD;  Location: [facility] OR;  Service: General;  Laterality: N/A;    SOCIAL HISTORY: Social History   Socioeconomic History   Marital status: Divorced    Spouse name: Not on file   Number of children: Not on file   Years of education: Not on file   Highest education level: Not on file  Occupational History   Not on file  Tobacco Use    Smoking status: Never   Smokeless tobacco: Never  Vaping Use   Vaping status: Never Used  Substance and Sexual Activity   Alcohol use: Yes    Comment: occasional beer   Drug use: Never   Sexual activity: Not Currently  Other Topics Concern   Not on file  Social History Narrative   He lives with his sister , [name] and she is his MPOA after his stokes   Social Determinants of Health   Financial Resource Strain: High Risk (03/20/2020)   Overall Financial Resource Strain (CARDIA)    Difficulty of Paying Living Expenses: Hard  Food Insecurity: No Food Insecurity (11/27/2022)   Hunger Vital Sign    Worried About Running Out of Food in the Last Year: Never true    Ran Out of Food in the Last Year: Never true  Transportation Needs: No Transportation Needs (11/27/2022)   PRAPARE - Administrator, Civil Service (Medical): No    Lack of Transportation (Non-Medical): No  Recent  Concern: Transportation Needs - Unmet Transportation Needs (09/27/2022)   PRAPARE - Administrator, Civil Service (Medical): No    Lack of Transportation (Non-Medical): Yes  Physical Activity: Not on file  Stress: Not on file  Social Connections: Not on file  Intimate Partner Violence: Not At Risk (11/27/2022)   Humiliation, Afraid, Rape, and Kick questionnaire    Fear of Current or Ex-Partner: No    Emotionally Abused: No    Physically Abused: No    Sexually Abused: No    FAMILY HISTORY: Family History  Problem Relation Age of Onset   Diabetes Mother    Stroke Mother    Arthritis Mother    Depression Mother    Heart disease Mother    Hypertension Mother    Learning disabilities Mother    Mental illness Mother    Sleep apnea Mother    Diabetes Father    Heart disease Father    Arthritis Father    Hearing loss Father    Hyperlipidemia Father    Heart attack Father    Hypertension Father    Stroke Father    Diabetes Sister    Depression Sister    Diabetes Sister     Hypertension Sister    Mental illness Sister    Sleep apnea Sister    Diabetes Maternal Grandmother    Heart disease Maternal Grandmother    Depression Maternal Grandmother    Hyperlipidemia Maternal Grandmother    Hypertension Maternal Grandmother    Stroke Maternal Grandmother    Hypertension Maternal Grandfather    Hyperlipidemia Maternal Grandfather    Stroke Maternal Grandfather    Arthritis Paternal Grandmother    Hearing loss Paternal Grandmother    Stroke Paternal Grandfather    Heart disease Paternal Grandfather    Arthritis Paternal Grandfather    Heart attack Paternal Grandfather    Melanoma Other     ALLERGIES:  is allergic to pollen extract and sulfa antibiotics.  MEDICATIONS:  Current Outpatient Medications  Medication Sig Dispense Refill   acetaminophen (TYLENOL) 325 MG tablet Take 2 tablets (650 mg total) by mouth every 4 (four) hours as needed for mild pain (or temp > 37.5 C (99.5 F)).     apixaban (ELIQUIS) 2.5 MG TABS tablet Take 1 tablet (2.5 mg total) by mouth 2 (two) times daily. 60 tablet 2   atorvastatin (LIPITOR) 40 MG tablet Take 1 tablet (40 mg total) by mouth daily at 6 PM. 90 tablet 3   busPIRone (BUSPAR) 7.5 MG tablet Take 1 tablet (7.5 mg total) by mouth 2 (two) times daily. 180 tablet 1   dapagliflozin propanediol (FARXIGA) 10 MG TABS tablet Take 1 tablet (10 mg total) by mouth daily. 30 tablet 0   dexamethasone (DECADRON) 4 MG tablet TAKE 2 TABLETS(8 MG) BY MOUTH DAILY. START THE DAY AFTER CHEMOTHERAPY FOR 2 DAYS. TAKE WITH FOOD 30 tablet 1   diclofenac Sodium (VOLTAREN) 1 % GEL Apply 4 g topically 4 (four) times daily.     gabapentin (NEURONTIN) 100 MG capsule Take 2 capsules (200 mg total) by mouth 2 (two) times daily. 120 capsule 0   hydrALAZINE (APRESOLINE) 25 MG tablet Take 1 tablet (25 mg total) by mouth every 8 (eight) hours. 90 tablet 0   ondansetron (ZOFRAN-ODT) 8 MG disintegrating tablet Take 1 tablet (8 mg total) by mouth every 8 (eight)  hours as needed for nausea or vomiting. 60 tablet 1   pantoprazole (PROTONIX) 40 MG tablet Take 1  tablet (40 mg total) by mouth 2 (two) times daily before a meal. 60 tablet 3   prochlorperazine (COMPAZINE) 10 MG tablet Take 1 tablet (10 mg total) by mouth every 6 (six) hours as needed for nausea or vomiting. 60 tablet 0   sertraline (ZOLOFT) 50 MG tablet Take 1 tablet (50 mg total) by mouth daily. 30 tablet 3   torsemide (DEMADEX) 10 MG tablet Take 1 tablet (10 mg total) by mouth daily as needed (lower ext swelling). 30 tablet 0   Continuous Glucose Sensor (FREESTYLE LIBRE 2 SENSOR) MISC Place 1 sensor on the skin every 14 days. Use to check glucose continuously (Patient not taking: Reported on 01/02/2023) 2 each 11   oxyCODONE (OXY IR/ROXICODONE) 5 MG immediate release tablet Take 1 tablet (5 mg total) by mouth every 6 (six) hours as needed for severe pain. 60 tablet 0   scopolamine (TRANSDERM-SCOP) 1 MG/3DAYS Place 1 patch (1.5 mg total) onto the skin every 3 (three) days. (Patient not taking: Reported on 01/02/2023) 4 patch 0   No current facility-administered medications for this visit.   Facility-Administered Medications Ordered in Other Visits  Medication Dose Route Frequency Provider Last Rate Last Admin   fluorouracil (ADRUCIL) 5,550 mg in sodium chloride 0.9 % 139 mL chemo infusion  2,400 mg/m2 (Treatment Plan Recorded) Intravenous 1 day or 1 dose Rickard Patience, MD       leucovorin 928 mg in dextrose 5 % 250 mL infusion  400 mg/m2 (Treatment Plan Recorded) Intravenous Once Rickard Patience, MD       oxaliplatin (ELOXATIN) 150 mg in dextrose 5 % 500 mL chemo infusion  65 mg/m2 (Treatment Plan Recorded) Intravenous Once Rickard Patience, MD        Review of Systems  Constitutional:  Positive for fatigue. Negative for appetite change, chills, fever and unexpected weight change.  HENT:   Negative for hearing loss and voice change.   Eyes:  Negative for eye problems and icterus.  Respiratory:  Negative for chest  tightness, cough and shortness of breath.   Cardiovascular:  Negative for chest pain and leg swelling.  Gastrointestinal:  Negative for abdominal distention, abdominal pain and nausea.  Endocrine: Negative for hot flashes.  Genitourinary:  Negative for difficulty urinating, dysuria and frequency.   Musculoskeletal:  Negative for arthralgias.       Left shoulder pain  Skin:  Negative for itching and rash.  Neurological:  Positive for numbness. Negative for light-headedness.  Hematological:  Negative for adenopathy. Does not bruise/bleed easily.  Psychiatric/Behavioral:  Negative for confusion.      PHYSICAL EXAMINATION: ECOG PERFORMANCE STATUS: 2 - Symptomatic, <50% confined to bed  Vitals:   01/07/23 0836  BP: 127/70  Pulse: 92  Resp: 18  Temp: 98.2 F (36.8 C)  SpO2: 98%   Filed Weights   01/07/23 0836  Weight: 256 lb (116.1 kg)    Physical Exam Constitutional:      General: He is not in acute distress.    Appearance: He is obese. He is not diaphoretic.  HENT:     Head: Normocephalic and atraumatic.  Eyes:     General: No scleral icterus.    Pupils: Pupils are equal, round, and reactive to light.  Cardiovascular:     Rate and Rhythm: Normal rate.  Pulmonary:     Effort: Pulmonary effort is normal. No respiratory distress.  Abdominal:     General: There is no distension.     Palpations: Abdomen is soft.  Tenderness: There is no abdominal tenderness.  Musculoskeletal:        General: Normal range of motion.     Cervical back: Normal range of motion and neck supple.     Left lower leg: Edema present.     Comments: Left shoulder sling   Skin:    General: Skin is warm and dry.  Neurological:     Mental Status: He is alert and oriented to person, place, and time. Mental status is at baseline.     Cranial Nerves: No cranial nerve deficit.     Motor: No abnormal muscle tone.  Psychiatric:        Mood and Affect: Mood and affect normal.      LABORATORY DATA:   I have reviewed the data as listed    Latest Ref Rng & Units 01/07/2023    8:00 AM 12/24/2022    8:50 AM 12/10/2022   11:00 AM  CBC  WBC 4.0 - 10.5 K/uL 5.5  4.7  8.3   Hemoglobin 13.0 - 17.0 g/dL 16.1  09.6  04.5   Hematocrit 39.0 - 52.0 % 32.4  37.3  36.0   Platelets 150 - 400 K/uL 184  245  353       Latest Ref Rng & Units 01/07/2023    8:04 AM 12/24/2022    8:50 AM 12/10/2022   11:00 AM  CMP  Glucose 70 - 99 mg/dL 409  811  914   BUN 6 - 20 mg/dL 29  22  34   Creatinine 0.61 - 1.24 mg/dL 7.82  9.56  2.13   Sodium 135 - 145 mmol/L 137  137  137   Potassium 3.5 - 5.1 mmol/L 3.7  4.0  3.7   Chloride 98 - 111 mmol/L 108  110  106   CO2 22 - 32 mmol/L 22  20  23    Calcium 8.9 - 10.3 mg/dL 8.5  8.7  8.4   Total Protein 6.5 - 8.1 g/dL 6.9  7.4  7.2   Total Bilirubin 0.3 - 1.2 mg/dL 0.9  0.6  0.4   Alkaline Phos 38 - 126 U/L 95  99  103   AST 15 - 41 U/L 19  20  27    ALT 0 - 44 U/L 17  18  22       RADIOGRAPHIC STUDIES: I have personally reviewed the radiological images as listed and agreed with the findings in the report. NM PET Image Restag (PS) Skull Base To Thigh  Result Date: 01/07/2023 CLINICAL DATA:  Subsequent treatment strategy for gastric cancer. EXAM: NUCLEAR MEDICINE PET SKULL BASE TO THIGH TECHNIQUE: 12.0 mCi F-18 FDG was injected intravenously. Full-ring PET imaging was performed from the skull base to thigh after the radiotracer. CT data was obtained and used for attenuation correction and anatomic localization. Fasting blood glucose: 95 mg/dl COMPARISON:  PET-CT dated 09/18/2022 FINDINGS: Mediastinal blood pool activity: SUV max 2.4 Liver activity: SUV max NA NECK: No hypermetabolic lymph nodes in the neck. Incidental CT findings: None. CHEST: No suspicious pulmonary nodules. Near complete resolution of prior hypermetabolic thoracic lymphadenopathy. Residual 18 mm short axis AP window node (series 6/image 49), max SUV 4.7. Right chest port terminating in the upper right  atrium. Incidental CT findings: Cardiomegaly. Mild atherosclerotic calcifications of the descending thoracic aorta. Mild coronary atherosclerosis of the LAD. ABDOMEN/PELVIS: Residual masslike thickening at the gastric cardia (series 6/image 36), corresponding to the patient's known primary gastric carcinoma, max SUV 5.5 (previously 16.8).  Near complete resolution of prior upper abdominal and retroperitoneal lymphadenopathy. Residual 3.7 x 2.1 cm perigastric node in the left upper abdomen (series 6/image 81), max SUV 5.0. Additional small peritoneal implants are no longer evident. Incidental CT findings: Cholelithiasis, without associated inflammatory changes. Vascular calcifications. Large midline lower ventral hernia containing cecum and loops of small bowel, without fluid/inflammatory changes. SKELETON: No focal hypermetabolic activity to suggest skeletal metastasis. Incidental CT findings: Degenerative changes of the visualized thoracolumbar spine. IMPRESSION: Significant interval response to therapy. Residual masslike thickening at the gastric cardia, corresponding to the patient's known primary gastric carcinoma, with decreased hypermetabolic activity. Near complete resolution of prior hypermetabolic thoracic and upper abdominal lymphadenopathy. Residual AP window and perigastric nodes. Peritoneal disease is no longer evident. Electronically Signed   By: Charline Bills M.D.   On: 01/07/2023 08:47   DG Shoulder Left  Result Date: 01/04/2023 CLINICAL DATA:  Fall, left shoulder pain EXAM: LEFT SHOULDER - 2+ VIEW COMPARISON:  None FINDINGS: There is a acute, minimally displaced fracture of the distal left clavicle distal to the expected coracoclavicular articulation with fracture fragments in near anatomic alignment. Acromioclavicular joint space is preserved. Glenohumeral joint space is preserved. No other fracture identified. No dislocation. Limited evaluation of the left hemithorax is unremarkable.  IMPRESSION: 1. Acute, minimally displaced distal left clavicular fracture. Electronically Signed   By: Helyn Numbers M.D.   On: 01/04/2023 23:27

## 2023-01-07 NOTE — Assessment & Plan Note (Addendum)
Recommend patient to take gabapentin 200mg  BID, he has not been taking

## 2023-01-07 NOTE — Assessment & Plan Note (Addendum)
Lab Results  Component Value Date   HGB 10.0 (L) 01/07/2023   TIBC 224 (L) 12/10/2022   IRONPCTSAT 25 12/10/2022   FERRITIN 229 12/10/2022    Hb is trending down. Repeat iron panel today.  Plan additional Venofer treatments, suspect GI blood loss due to resuming on Eliquis.

## 2023-01-07 NOTE — Assessment & Plan Note (Signed)
pt has hx of stroke, now has left-sided numbness. MRI of the brain with and without contrast showed subacute infarct in the left frontal operculum with petechial hemorrhage and mild surrounding edema. No significant mass effect or midline shift. MRA negative foir LVO. The MRI findings of left frontal operculum does not seem to explain his left-sided numbness. Neurologist during admission recommend PET brain or follow up with another MRI. 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 Barbaraann Cao for evaluation and management.  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

## 2023-01-07 NOTE — Assessment & Plan Note (Signed)
Follow up with ortho

## 2023-01-07 NOTE — Assessment & Plan Note (Signed)
Recommend Ativan 0.5 mg every 12 hours as needed for anxiety/sleep/nausea 

## 2023-01-07 NOTE — Assessment & Plan Note (Signed)
LFT is stable.

## 2023-01-07 NOTE — Assessment & Plan Note (Addendum)
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  PD-L1 TPS<1%, CPS 3, TMB 8.4, MSI can not be accessed. Labs are reviewed and discussed with patient. Proceed with 5-FU +Oxaliplatin 65mg /m2  Repeat PET scan Oct 2024 -good response.

## 2023-01-07 NOTE — Patient Instructions (Signed)
[facility]  Discharge Instructions: Thank you for choosing [facility] to provide your oncology and hematology care.  If you have a lab appointment with the Cancer Center, please go directly to the Cancer Center and check in at the registration area.  Wear comfortable clothing and clothing appropriate for easy access to any Portacath or PICC line.   We strive to give you quality time with your provider. You may need to reschedule your appointment if you arrive late (15 or more minutes).  Arriving late affects you and other patients whose appointments are after yours.  Also, if you miss three or more appointments without notifying the office, you may be dismissed from the clinic at the provider's discretion.      For prescription refill requests, have your pharmacy contact our office and allow 72 hours for refills to be completed.    Today you received the following chemotherapy and/or immunotherapy agents Oxaliplatin, Leucovorin and Adrucil       To help prevent nausea and vomiting after your treatment, we encourage you to take your nausea medication as directed.  BELOW ARE SYMPTOMS THAT SHOULD BE REPORTED IMMEDIATELY: *FEVER GREATER THAN 100.4 F (38 C) OR HIGHER *CHILLS OR SWEATING *NAUSEA AND VOMITING THAT IS NOT CONTROLLED WITH YOUR NAUSEA MEDICATION *UNUSUAL SHORTNESS OF BREATH *UNUSUAL BRUISING OR BLEEDING *URINARY PROBLEMS (pain or burning when urinating, or frequent urination) *BOWEL PROBLEMS (unusual diarrhea, constipation, pain near the anus) TENDERNESS IN MOUTH AND THROAT WITH OR WITHOUT PRESENCE OF ULCERS (sore throat, sores in mouth, or a toothache) UNUSUAL RASH, SWELLING OR PAIN  UNUSUAL VAGINAL DISCHARGE OR ITCHING   Items with * indicate a potential emergency and should be followed up as soon as possible or go to the Emergency Department if any problems should occur.  Please show the CHEMOTHERAPY ALERT CARD or  IMMUNOTHERAPY ALERT CARD at check-in to the Emergency Department and triage nurse.  Should you have questions after your visit or need to cancel or reschedule your appointment, please contact [facility]  [phone] and follow the prompts.  Office hours are 8:00 a.m. to 4:30 p.m. Monday - Friday. Please note that voicemails left after 4:00 p.m. may not be returned until the following business day.  We are closed weekends and major holidays. You have access to a nurse at all times for urgent questions. Please call the main number to the clinic [phone] and follow the prompts.  For any non-urgent questions, you may also contact your provider using MyChart. We now offer e-Visits for anyone 18 and older to request care online for non-urgent symptoms. For details visit [email or web address].   Also download the MyChart app! Go to the app store, search "MyChart", open the app, select [facility], and log in with your MyChart username and password.   

## 2023-01-07 NOTE — Progress Notes (Signed)
[facility] Healthcare Advance Directives Clinical Social Work  Patient presented to Advance Directives Clinic  to review and complete healthcare advance directives.  Clinical Social Worker met with patient and his sister, [name].  The patient designated Nolin Grell as their primary healthcare agent and Gurveer Colucci as their secondary agent.  Patient also completed healthcare living will.    Documents were notarized and copies made for patient/family. Clinical Social Worker will send documents to medical records to be scanned into patient's chart. Clinical Social Worker encouraged patient/family to contact with any additional questions or concerns.   Dorothey Baseman, LCSW Clinical Social Worker [facility]        Patient is participating in a Managed Medicaid Plan:  Yes

## 2023-01-08 ENCOUNTER — Ambulatory Visit: Payer: Medicaid Other | Admitting: Physical Therapy

## 2023-01-08 ENCOUNTER — Encounter: Payer: Self-pay | Admitting: Oncology

## 2023-01-08 DIAGNOSIS — Z0271 Encounter for disability determination: Secondary | ICD-10-CM

## 2023-01-08 LAB — CEA: CEA: 2.9 ng/mL (ref 0.0–4.7)

## 2023-01-09 ENCOUNTER — Telehealth: Payer: Self-pay

## 2023-01-09 ENCOUNTER — Inpatient Hospital Stay: Payer: Medicaid Other

## 2023-01-09 ENCOUNTER — Telehealth: Payer: Self-pay | Admitting: Neurology

## 2023-01-09 VITALS — BP 156/82 | HR 96 | Temp 97.0°F | Resp 19

## 2023-01-09 DIAGNOSIS — Z5111 Encounter for antineoplastic chemotherapy: Secondary | ICD-10-CM | POA: Diagnosis not present

## 2023-01-09 DIAGNOSIS — C160 Malignant neoplasm of cardia: Secondary | ICD-10-CM

## 2023-01-09 DIAGNOSIS — D500 Iron deficiency anemia secondary to blood loss (chronic): Secondary | ICD-10-CM

## 2023-01-09 MED ORDER — SODIUM CHLORIDE 0.9 % IV SOLN
Freq: Once | INTRAVENOUS | Status: AC
  Filled 2023-01-09: qty 250

## 2023-01-09 MED ORDER — SODIUM CHLORIDE 0.9 % IV SOLN
200.0000 mg | Freq: Once | INTRAVENOUS | Status: AC
  Administered 2023-01-09: 200 mg via INTRAVENOUS
  Filled 2023-01-09: qty 200

## 2023-01-09 MED ORDER — HEPARIN SOD (PORK) LOCK FLUSH 100 UNIT/ML IV SOLN
500.0000 [IU] | Freq: Once | INTRAVENOUS | Status: AC | PRN
  Administered 2023-01-09: 500 [IU]
  Filled 2023-01-09: qty 5

## 2023-01-09 NOTE — Progress Notes (Signed)
[facility] CSW Progress Note  Clinical Child psychotherapist met with patient in infusion per RN's request.  Patient stated he has not been able to bathe since Saturday since he fell and is wearing a sling on his left arm.  He said he is using wipes, however.  Patient requested a shower chair.  CSW to request order from MD for assistance.  Patient lives with his sister, [name], who is a CMA.  She requested aid services for patient in their home since he is having difficulty with ADLs.  CSW in the process of applying for PCS with Medicaid.    Dorothey Baseman, LCSW Clinical Social Worker [facility]    Patient is participating in a Managed Medicaid Plan:  Yes

## 2023-01-09 NOTE — Telephone Encounter (Signed)
Order for shower chair entered via [facility] portal.

## 2023-01-09 NOTE — Telephone Encounter (Signed)
PA for Oxycodone 5mg  has been sumbitted via covermymeds. Insurance response pending  Key: [id number] PA caase ID#: [id number]

## 2023-01-09 NOTE — Telephone Encounter (Signed)
PA approved by [employer] from 01/09/23 to 07/10/23. Pharmacy and pt notified.

## 2023-01-09 NOTE — Telephone Encounter (Signed)
LVM and sent mychart msg informing pt of need to reschedule 02/26/23 appt - MD out

## 2023-01-13 ENCOUNTER — Ambulatory Visit: Payer: Medicaid Other | Admitting: Physical Therapy

## 2023-01-14 DIAGNOSIS — T451X5A Adverse effect of antineoplastic and immunosuppressive drugs, initial encounter: Secondary | ICD-10-CM | POA: Diagnosis not present

## 2023-01-14 DIAGNOSIS — M898X1 Other specified disorders of bone, shoulder: Secondary | ICD-10-CM | POA: Diagnosis not present

## 2023-01-14 DIAGNOSIS — G620 Drug-induced polyneuropathy: Secondary | ICD-10-CM | POA: Diagnosis not present

## 2023-01-15 ENCOUNTER — Ambulatory Visit: Payer: Medicaid Other | Admitting: Physical Therapy

## 2023-01-16 ENCOUNTER — Inpatient Hospital Stay: Payer: Medicaid Other

## 2023-01-16 VITALS — BP 135/86 | HR 91 | Temp 95.7°F | Resp 17

## 2023-01-16 DIAGNOSIS — D500 Iron deficiency anemia secondary to blood loss (chronic): Secondary | ICD-10-CM

## 2023-01-16 DIAGNOSIS — Z5111 Encounter for antineoplastic chemotherapy: Secondary | ICD-10-CM | POA: Diagnosis not present

## 2023-01-16 MED ORDER — SODIUM CHLORIDE 0.9 % IV SOLN
Freq: Once | INTRAVENOUS | Status: AC
  Filled 2023-01-16: qty 250

## 2023-01-16 MED ORDER — SODIUM CHLORIDE 0.9 % IV SOLN
200.0000 mg | Freq: Once | INTRAVENOUS | Status: AC
  Administered 2023-01-16: 200 mg via INTRAVENOUS
  Filled 2023-01-16: qty 200

## 2023-01-16 MED ORDER — HEPARIN SOD (PORK) LOCK FLUSH 100 UNIT/ML IV SOLN
500.0000 [IU] | Freq: Once | INTRAVENOUS | Status: AC | PRN
  Administered 2023-01-16: 500 [IU]
  Filled 2023-01-16: qty 5

## 2023-01-17 ENCOUNTER — Inpatient Hospital Stay (HOSPITAL_BASED_OUTPATIENT_CLINIC_OR_DEPARTMENT_OTHER): Payer: Medicaid Other | Admitting: Internal Medicine

## 2023-01-17 VITALS — BP 153/96 | HR 80 | Resp 18 | Ht 71.0 in | Wt 256.0 lb

## 2023-01-17 DIAGNOSIS — I639 Cerebral infarction, unspecified: Secondary | ICD-10-CM

## 2023-01-17 DIAGNOSIS — G620 Drug-induced polyneuropathy: Secondary | ICD-10-CM | POA: Diagnosis not present

## 2023-01-17 DIAGNOSIS — T451X5A Adverse effect of antineoplastic and immunosuppressive drugs, initial encounter: Secondary | ICD-10-CM | POA: Diagnosis not present

## 2023-01-17 DIAGNOSIS — Z5111 Encounter for antineoplastic chemotherapy: Secondary | ICD-10-CM | POA: Diagnosis not present

## 2023-01-17 NOTE — Progress Notes (Signed)
[facility] [address]  [place], [place] [address] [phone]   New Patient Evaluation  Date of Service: 01/17/23 Patient Name: PRABHJOT BEGER Patient MRN: [id number] Patient DOB: 1970/07/21 Provider: Henreitta Leber, MD  Identifying Statement:  CUTLER PATRIARCA is a 52 y.o. male with Ischemic cerebrovascular accident (CVA) of frontal lobe (HCC)  Chemotherapy-induced neuropathy (HCC) who presents for initial consultation and evaluation regarding cancer associated neurologic deficits.    Referring Provider: Debera Lat, PA-C [address] [place],  [place] [address]  Primary Cancer:  Oncologic History: Oncology History  Gastric cancer (HCC)  09/08/2022 Imaging   CT abdomen pelvis w contrast     09/08/2022 Imaging   CT abdomen pelvis with contrast  Diffuse masslike thickening in the area of the GE junction and upper stomach with the extensive abnormal lymph nodes throughout the abdomen including retrocrural, porta hepatis, retroperitoneum and lesser and greater curve of the stomach. In addition there are areas of suggest peritoneal carcinomatosis. Recommend further evaluation.    No bowel obstruction. Large midline anterior pelvic wall hernia involving small bowel and mesenteric fat with rectus muscle diastasis. Gallstones.   09/19/2022 Cancer Staging   Staging form: Stomach, AJCC 8th Edition - Clinical stage from 09/19/2022: Stage IVB (cTX, cN2, cM1) - Signed by Rickard Patience, MD on 09/19/2022 Stage prefix: Initial diagnosis   09/19/2022 Initial Diagnosis   Gastric cancer (HCC)  09/08/2022, patient presented emergency room for evaluation of abdominal pain and vomiting.  He has also had weight loss.  Dysphagia with solid food for couple of months.  CT abdomen pelvis showed diffuse masslike thickening of the GE junction and upper stomach with extensive abnormal lymph nodes throughout abdomen.  09/10/2022 upper endoscopy showed  malignant appearance gastric tumor at the GE junction and in the cardia Biopsied.  Nodular mucosa in the second portion of duodenum.  Biopsied. GE junction mass showed moderate to poorly differentiated adenocarcinoma undermining squamous mucosa with focal area of squamous atypia.  Cannot exclude adenosquamous carcinoma.  Intestinal metaplasia is not identified. Duodenum cold biopsy-reactive duodenitis.  Negative for dysplasia and malignancy. Stomach random biopsy negative for H. pylori, intestinal metaplasia, dysplasia and malignancy.  Stomach Cardia mass biopsy showed moderate to poorly differentiated adenocarcinoma.  Patient's case was discussed on tumor board on 09/19/2022.  Per Dr. Oneita Kras, nephrology findings of both GE junction mass and gastric cardia mass or similar. HER2 staining negative. PD-L1 TPS<1%, CPS 3, TMB 8.4, MSI can not be accessed.  KMT2D frame shift, GATA6 copy number gain.  Molecular testing is pending.     09/23/2022 Imaging   PET scan showed 1. 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 2. Enlarged cirrhotic liver. 3. Cholelithiasis. 4. Bilateral renal stones. 5. Umbilical hernias contain unobstructed small bowel and/or fat.  6. Aortic atherosclerosis (ICD10-I70.0). Coronary artery calcification. 7. Enlarged pulmonic trunk, indicative of pulmonary arterial hypertension.    10/02/2022 -  Chemotherapy   Patient is on Treatment Plan : GASTROESOPHAGEAL FOLFOX q14d     12/31/2022 Imaging   PET showed Significant interval response to therapy.   Residual masslike thickening at the gastric cardia, corresponding to the patient's known primary gastric carcinoma, with decreased hypermetabolic activity.   Near complete resolution of prior hypermetabolic thoracic and upper abdominal lymphadenopathy. Residual AP window and perigastric nodes.   Peritoneal disease is no longer evident.     History of Present Illness: The  patient's records from the referring physician  were obtained and reviewed and the patient interviewed to confirm this HPI.  Maximilian D Mikolajczak presents today to review recent neurologic syndrome.  He describes sudden onset numbness of his left side, which led to an admission at [facility].  Was found to have stroke in the left frontal lobe, suspected secondary to break in anticoagulation for atrial fibrillation.  Symptoms resolved after 1-2 days.  Following discharge, he was resumed on eliquis.  Today he feels at baseline aside from ongoing left lateral arm and hand tingling, which predates the stroke.  He continues on folfox with Dr. Cathie Hoops.          Medications: Current Outpatient Medications on File Prior to Visit  Medication Sig Dispense Refill   acetaminophen (TYLENOL) 325 MG tablet Take 2 tablets (650 mg total) by mouth every 4 (four) hours as needed for mild pain (or temp > 37.5 C (99.5 F)).     apixaban (ELIQUIS) 2.5 MG TABS tablet Take 1 tablet (2.5 mg total) by mouth 2 (two) times daily. 60 tablet 2   atorvastatin (LIPITOR) 40 MG tablet Take 1 tablet (40 mg total) by mouth daily at 6 PM. 90 tablet 3   busPIRone (BUSPAR) 7.5 MG tablet Take 1 tablet (7.5 mg total) by mouth 2 (two) times daily. 180 tablet 1   dapagliflozin propanediol (FARXIGA) 10 MG TABS tablet Take 1 tablet (10 mg total) by mouth daily. 30 tablet 0   dexamethasone (DECADRON) 4 MG tablet TAKE 2 TABLETS(8 MG) BY MOUTH DAILY. START THE DAY AFTER CHEMOTHERAPY FOR 2 DAYS. TAKE WITH FOOD 30 tablet 1   hydrALAZINE (APRESOLINE) 25 MG tablet Take 1 tablet (25 mg total) by mouth every 8 (eight) hours. 90 tablet 0   oxyCODONE (OXY IR/ROXICODONE) 5 MG immediate release tablet Take 1 tablet (5 mg total) by mouth every 6 (six) hours as needed for severe pain. 60 tablet 0   pantoprazole (PROTONIX) 40 MG tablet Take 1 tablet (40 mg total) by mouth 2 (two) times daily before a meal. 60 tablet 3   sertraline (ZOLOFT) 50 MG tablet Take 1 tablet (50 mg total)  by mouth daily. 30 tablet 3   Continuous Glucose Sensor (FREESTYLE LIBRE 2 SENSOR) MISC Place 1 sensor on the skin every 14 days. Use to check glucose continuously (Patient not taking: Reported on 01/02/2023) 2 each 11   diclofenac Sodium (VOLTAREN) 1 % GEL Apply 4 g topically 4 (four) times daily. (Patient not taking: Reported on 01/17/2023)     gabapentin (NEURONTIN) 100 MG capsule Take 2 capsules (200 mg total) by mouth 2 (two) times daily. (Patient not taking: Reported on 01/17/2023) 120 capsule 0   ondansetron (ZOFRAN-ODT) 8 MG disintegrating tablet Take 1 tablet (8 mg total) by mouth every 8 (eight) hours as needed for nausea or vomiting. (Patient not taking: Reported on 01/17/2023) 60 tablet 1   prochlorperazine (COMPAZINE) 10 MG tablet Take 1 tablet (10 mg total) by mouth every 6 (six) hours as needed for nausea or vomiting. (Patient not taking: Reported on 01/17/2023) 60 tablet 0   scopolamine (TRANSDERM-SCOP) 1 MG/3DAYS Place 1 patch (1.5 mg total) onto the skin every 3 (three) days. (Patient not taking: Reported on 01/02/2023) 4 patch 0   torsemide (DEMADEX) 10 MG tablet Take 1 tablet (10 mg total) by mouth daily as needed (lower ext swelling). (Patient not taking: Reported on 01/17/2023) 30 tablet 0   No current facility-administered medications on file prior to visit.    Allergies:  Allergies  Allergen Reactions   Pollen Extract Other (See Comments)   Sulfa Antibiotics    Past Medical History:  Past Medical History:  Diagnosis Date   Acute ischemic stroke (HCC) 09/06/2017   Acute renal failure superimposed on stage 3a chronic kidney disease (HCC) 07/08/2019   Acute right-sided weakness    Allergies    Anasarca    Anemia 07/10/2017   Arthritis    Cancer (HCC)    stomach and esophageal cancer stage 4   CHF (congestive heart failure) (HCC)    "coinsided w/kidney problems I was having 06/2017"   Chicken pox    CKD (chronic kidney disease) stage 3, GFR 30-59 ml/min (HCC) 06/2017    Depression    Elevated troponin 07/08/2019   High cholesterol    History of cardiomyopathy    LVEF 40 to 45% in April 2019 - subsequently normalized   Hyperbilirubinemia 07/10/2017   Hypertension    Ischemic stroke (HCC)    Small left internal capsule infarct due to lacunar disease   Morbid obesity (HCC)    Normocytic anemia 12/16/2017   Recurrent incisional hernia with incarceration s/p repair 10/22/2017 10/21/2017   Stroke (HCC) 08/2017   "right sided weakness since; getting stronger though" (10/22/2017)   Type 2 diabetes mellitus [facility])    Past Surgical History:  Past Surgical History:  Procedure Laterality Date   ABDOMINAL HERNIA REPAIR  2008; 10/22/2017   "scope; OPEN REPAIR INCARCERATED VENTRAL HERNIA   ESOPHAGOGASTRODUODENOSCOPY (EGD) WITH PROPOFOL N/A 09/10/2022   Procedure: ESOPHAGOGASTRODUODENOSCOPY (EGD) WITH PROPOFOL;  Surgeon: Toney Reil, MD;  Location: [facility] ENDOSCOPY;  Service: Gastroenterology;  Laterality: N/A;   ESOPHAGOGASTRODUODENOSCOPY (EGD) WITH PROPOFOL N/A 09/28/2022   Procedure: ESOPHAGOGASTRODUODENOSCOPY (EGD) WITH PROPOFOL;  Surgeon: Jaynie Collins, DO;  Location: [facility] ENDOSCOPY;  Service: Gastroenterology;  Laterality: N/A;   HEMOSTASIS CONTROL  09/28/2022   Procedure: HEMOSTASIS CONTROL;  Surgeon: Jaynie Collins, DO;  Location: [facility] ENDOSCOPY;  Service: Gastroenterology;;   HERNIA REPAIR     KNEE ARTHROSCOPY Right 1989   PORTA CATH INSERTION N/A 09/25/2022   Procedure: PORTA CATH INSERTION;  Surgeon: Annice Needy, MD;  Location: [facility] INVASIVE CV LAB;  Service: Cardiovascular;  Laterality: N/A;   VENTRAL HERNIA REPAIR N/A 10/22/2017   Procedure: OPEN REPAIR INCARCERATED VENTRAL HERNIA;  Surgeon: Glenna Fellows, MD;  Location: [facility] OR;  Service: General;  Laterality: N/A;   Social History:  Social History   Socioeconomic History   Marital status: Divorced    Spouse name: Not on file   Number of children: Not on file   Years of  education: Not on file   Highest education level: Not on file  Occupational History   Not on file  Tobacco Use   Smoking status: Never   Smokeless tobacco: Never  Vaping Use   Vaping status: Never Used  Substance and Sexual Activity   Alcohol use: Yes    Comment: occasional beer   Drug use: Never   Sexual activity: Not Currently  Other Topics Concern   Not on file  Social History Narrative   He lives with his sister , [name] and she is his MPOA after his stokes   Social Determinants of Health   Financial Resource Strain: High Risk (03/20/2020)   Overall Financial Resource Strain (CARDIA)    Difficulty of Paying Living Expenses: Hard  Food Insecurity: No Food Insecurity (11/27/2022)   Hunger Vital Sign    Worried About Running Out of Food in the Last Year: Never  true    Ran Out of Food in the Last Year: Never true  Transportation Needs: No Transportation Needs (11/27/2022)   PRAPARE - Administrator, Civil Service (Medical): No    Lack of Transportation (Non-Medical): No  Recent Concern: Transportation Needs - Unmet Transportation Needs (09/27/2022)   PRAPARE - Administrator, Civil Service (Medical): No    Lack of Transportation (Non-Medical): Yes  Physical Activity: Not on file  Stress: Not on file  Social Connections: Not on file  Intimate Partner Violence: Not At Risk (11/27/2022)   Humiliation, Afraid, Rape, and Kick questionnaire    Fear of Current or Ex-Partner: No    Emotionally Abused: No    Physically Abused: No    Sexually Abused: No   Family History:  Family History  Problem Relation Age of Onset   Diabetes Mother    Stroke Mother    Arthritis Mother    Depression Mother    Heart disease Mother    Hypertension Mother    Learning disabilities Mother    Mental illness Mother    Sleep apnea Mother    Diabetes Father    Heart disease Father    Arthritis Father    Hearing loss Father    Hyperlipidemia Father    Heart attack  Father    Hypertension Father    Stroke Father    Diabetes Sister    Depression Sister    Diabetes Sister    Hypertension Sister    Mental illness Sister    Sleep apnea Sister    Diabetes Maternal Grandmother    Heart disease Maternal Grandmother    Depression Maternal Grandmother    Hyperlipidemia Maternal Grandmother    Hypertension Maternal Grandmother    Stroke Maternal Grandmother    Hypertension Maternal Grandfather    Hyperlipidemia Maternal Grandfather    Stroke Maternal Grandfather    Arthritis Paternal Grandmother    Hearing loss Paternal Grandmother    Stroke Paternal Grandfather    Heart disease Paternal Grandfather    Arthritis Paternal Grandfather    Heart attack Paternal Grandfather    Melanoma Other     Review of Systems: Constitutional: Doesn't report fevers, chills or abnormal weight loss Eyes: Doesn't report blurriness of vision Ears, nose, mouth, throat, and face: Doesn't report sore throat Respiratory: Doesn't report cough, dyspnea or wheezes Cardiovascular: Doesn't report palpitation, chest discomfort  Gastrointestinal:  Doesn't report nausea, constipation, diarrhea GU: Doesn't report incontinence Skin: Doesn't report skin rashes Neurological: Per HPI Musculoskeletal: Doesn't report joint pain Behavioral/Psych: Doesn't report anxiety  Physical Exam: Vitals:   01/17/23 0858  BP: (!) 153/96  Pulse: 80  Resp: 18  SpO2: 100%   KPS: 70. General: Alert, cooperative, pleasant, in no acute distress Head: Normal EENT: No conjunctival injection or scleral icterus.  Lungs: Resp effort normal Cardiac: Regular rate Abdomen: Non-distended abdomen Skin: No rashes cyanosis or petechiae. Extremities: noted edema  Neurologic Exam: Mental Status: Awake, alert, attentive to examiner. Oriented to self and environment. Language is noted for mildly impaired fluency with intact comprehension.  Cranial Nerves: Visual acuity is severely impaired. Visual fields  are full. Extra-ocular movements intact. No ptosis. Face is symmetric Motor: Tone and bulk are normal. Power is full in both arms and legs. Reflexes are symmetric, no pathologic reflexes present.  Sensory: Impaired in ulnar pattern left arm  Gait: Deferred   Labs: I have reviewed the data as listed    Component Value Date/Time  NA 137 01/07/2023 0804   NA 139 05/23/2022 1606   K 3.7 01/07/2023 0804   CL 108 01/07/2023 0804   CO2 22 01/07/2023 0804   GLUCOSE 107 (H) 01/07/2023 0804   BUN 29 (H) 01/07/2023 0804   BUN 29 (H) 05/23/2022 1606   CREATININE 1.03 01/07/2023 0804   CALCIUM 8.5 (L) 01/07/2023 0804   PROT 6.9 01/07/2023 0804   PROT 6.9 05/23/2022 1606   ALBUMIN 3.3 (L) 01/07/2023 0804   ALBUMIN 3.9 05/23/2022 1606   AST 19 01/07/2023 0804   ALT 17 01/07/2023 0804   ALKPHOS 95 01/07/2023 0804   BILITOT 0.9 01/07/2023 0804   GFRNONAA >60 01/07/2023 0804   GFRAA 72 05/19/2020 1139   Lab Results  Component Value Date   WBC 5.5 01/07/2023   NEUTROABS 3.5 01/07/2023   HGB 10.0 (L) 01/07/2023   HCT 32.4 (L) 01/07/2023   MCV 86.2 01/07/2023   PLT 184 01/07/2023    Imaging:  NM PET Image Restag (PS) Skull Base To Thigh  Result Date: 01/07/2023 CLINICAL DATA:  Subsequent treatment strategy for gastric cancer. EXAM: NUCLEAR MEDICINE PET SKULL BASE TO THIGH TECHNIQUE: 12.0 mCi F-18 FDG was injected intravenously. Full-ring PET imaging was performed from the skull base to thigh after the radiotracer. CT data was obtained and used for attenuation correction and anatomic localization. Fasting blood glucose: 95 mg/dl COMPARISON:  PET-CT dated 09/18/2022 FINDINGS: Mediastinal blood pool activity: SUV max 2.4 Liver activity: SUV max NA NECK: No hypermetabolic lymph nodes in the neck. Incidental CT findings: None. CHEST: No suspicious pulmonary nodules. Near complete resolution of prior hypermetabolic thoracic lymphadenopathy. Residual 18 mm short axis AP window node (series 6/image  49), max SUV 4.7. Right chest port terminating in the upper right atrium. Incidental CT findings: Cardiomegaly. Mild atherosclerotic calcifications of the descending thoracic aorta. Mild coronary atherosclerosis of the LAD. ABDOMEN/PELVIS: Residual masslike thickening at the gastric cardia (series 6/image 36), corresponding to the patient's known primary gastric carcinoma, max SUV 5.5 (previously 16.8). Near complete resolution of prior upper abdominal and retroperitoneal lymphadenopathy. Residual 3.7 x 2.1 cm perigastric node in the left upper abdomen (series 6/image 81), max SUV 5.0. Additional small peritoneal implants are no longer evident. Incidental CT findings: Cholelithiasis, without associated inflammatory changes. Vascular calcifications. Large midline lower ventral hernia containing cecum and loops of small bowel, without fluid/inflammatory changes. SKELETON: No focal hypermetabolic activity to suggest skeletal metastasis. Incidental CT findings: Degenerative changes of the visualized thoracolumbar spine. IMPRESSION: Significant interval response to therapy. Residual masslike thickening at the gastric cardia, corresponding to the patient's known primary gastric carcinoma, with decreased hypermetabolic activity. Near complete resolution of prior hypermetabolic thoracic and upper abdominal lymphadenopathy. Residual AP window and perigastric nodes. Peritoneal disease is no longer evident. Electronically Signed   By: Charline Bills M.D.   On: 01/07/2023 08:47   DG Shoulder Left  Result Date: 01/04/2023 CLINICAL DATA:  Fall, left shoulder pain EXAM: LEFT SHOULDER - 2+ VIEW COMPARISON:  None FINDINGS: There is a acute, minimally displaced fracture of the distal left clavicle distal to the expected coracoclavicular articulation with fracture fragments in near anatomic alignment. Acromioclavicular joint space is preserved. Glenohumeral joint space is preserved. No other fracture identified. No dislocation.  Limited evaluation of the left hemithorax is unremarkable. IMPRESSION: 1. Acute, minimally displaced distal left clavicular fracture. Electronically Signed   By: Helyn Numbers M.D.   On: 01/04/2023 23:27      Assessment/Plan Ischemic cerebrovascular accident (CVA) of frontal lobe (  HCC)  Chemotherapy-induced neuropathy (HCC)  Charlie D House presents with clinical syndrome c/w left frontal infarct, secondary to cardioembolism.    Patient is now back on anticoagulation, eliquis 2.5mg  BID.    Language deficits are stable, left sided numbness has not persisted.    Right arm numbness and tingling is likely secondary to chronic compression and oxaliplatin toxicity.  He will defer gabapentin for now.  We spent twenty additional minutes teaching regarding the natural history, biology, and historical experience in the treatment of neurologic complications of cancer.   We appreciate the opportunity to participate in the care of Darreon D Skyles.  He may follow up with new or progressive neurologic symptoms.  All questions were answered. The patient knows to call the clinic with any problems, questions or concerns. No barriers to learning were detected.  The total time spent in the encounter was 40 minutes and more than 50% was on counseling and review of test results   Henreitta Leber, MD Medical Director of Neuro-Oncology [facility] 01/17/23 9:01 AM

## 2023-01-20 ENCOUNTER — Ambulatory Visit: Payer: Medicaid Other | Admitting: Physical Therapy

## 2023-01-20 ENCOUNTER — Encounter: Payer: Self-pay | Admitting: Family Medicine

## 2023-01-20 NOTE — Assessment & Plan Note (Signed)
Continue hydralazine 25 mg 3 times daily Patient will be checking his blood pressures at home, keeping a log and bring them into his next visit, as well as his blood pressure cuff for comparison to the clinic cuff.

## 2023-01-20 NOTE — Assessment & Plan Note (Signed)
Patient doing well and continuing to improve since his most recent stroke.  He follows with neurology tomorrow. No acute concerns; will continue to monitor.

## 2023-01-21 ENCOUNTER — Inpatient Hospital Stay: Payer: Medicaid Other

## 2023-01-21 ENCOUNTER — Inpatient Hospital Stay (HOSPITAL_BASED_OUTPATIENT_CLINIC_OR_DEPARTMENT_OTHER): Payer: Medicaid Other | Admitting: Oncology

## 2023-01-21 ENCOUNTER — Ambulatory Visit: Payer: Medicaid Other | Admitting: Physical Medicine & Rehabilitation

## 2023-01-21 ENCOUNTER — Encounter: Payer: Self-pay | Admitting: Oncology

## 2023-01-21 VITALS — BP 132/81 | HR 76 | Temp 97.5°F | Resp 18 | Wt 258.4 lb

## 2023-01-21 DIAGNOSIS — C160 Malignant neoplasm of cardia: Secondary | ICD-10-CM

## 2023-01-21 DIAGNOSIS — G620 Drug-induced polyneuropathy: Secondary | ICD-10-CM

## 2023-01-21 DIAGNOSIS — K7469 Other cirrhosis of liver: Secondary | ICD-10-CM

## 2023-01-21 DIAGNOSIS — C801 Malignant (primary) neoplasm, unspecified: Secondary | ICD-10-CM | POA: Diagnosis not present

## 2023-01-21 DIAGNOSIS — F411 Generalized anxiety disorder: Secondary | ICD-10-CM | POA: Diagnosis not present

## 2023-01-21 DIAGNOSIS — Z5111 Encounter for antineoplastic chemotherapy: Secondary | ICD-10-CM | POA: Diagnosis not present

## 2023-01-21 DIAGNOSIS — I639 Cerebral infarction, unspecified: Secondary | ICD-10-CM

## 2023-01-21 DIAGNOSIS — D500 Iron deficiency anemia secondary to blood loss (chronic): Secondary | ICD-10-CM | POA: Diagnosis not present

## 2023-01-21 DIAGNOSIS — T451X5A Adverse effect of antineoplastic and immunosuppressive drugs, initial encounter: Secondary | ICD-10-CM

## 2023-01-21 LAB — CMP (CANCER CENTER ONLY)
ALT: 21 U/L (ref 0–44)
AST: 21 U/L (ref 15–41)
Albumin: 3.5 g/dL (ref 3.5–5.0)
Alkaline Phosphatase: 112 U/L (ref 38–126)
Anion gap: 8 (ref 5–15)
BUN: 40 mg/dL — ABNORMAL HIGH (ref 6–20)
CO2: 24 mmol/L (ref 22–32)
Calcium: 8.6 mg/dL — ABNORMAL LOW (ref 8.9–10.3)
Chloride: 107 mmol/L (ref 98–111)
Creatinine: 1.49 mg/dL — ABNORMAL HIGH (ref 0.61–1.24)
GFR, Estimated: 56 mL/min — ABNORMAL LOW (ref >60)
Glucose, Bld: 145 mg/dL — ABNORMAL HIGH (ref 70–99)
Potassium: 3.8 mmol/L (ref 3.5–5.1)
Sodium: 139 mmol/L (ref 135–145)
Total Bilirubin: 0.8 mg/dL (ref 0.3–1.2)
Total Protein: 6.9 g/dL (ref 6.5–8.1)

## 2023-01-21 LAB — CBC WITH DIFFERENTIAL (CANCER CENTER ONLY)
Abs Immature Granulocytes: 0.03 10*3/uL (ref 0.00–0.07)
Basophils Absolute: 0 10*3/uL (ref 0.0–0.1)
Basophils Relative: 0 %
Eosinophils Absolute: 0.1 10*3/uL (ref 0.0–0.5)
Eosinophils Relative: 1 %
HCT: 32.6 % — ABNORMAL LOW (ref 39.0–52.0)
Hemoglobin: 10.3 g/dL — ABNORMAL LOW (ref 13.0–17.0)
Immature Granulocytes: 0 %
Lymphocytes Relative: 13 %
Lymphs Abs: 0.9 10*3/uL (ref 0.7–4.0)
MCH: 27.5 pg (ref 26.0–34.0)
MCHC: 31.6 g/dL (ref 30.0–36.0)
MCV: 87.2 fL (ref 80.0–100.0)
Monocytes Absolute: 1.1 10*3/uL — ABNORMAL HIGH (ref 0.1–1.0)
Monocytes Relative: 15 %
Neutro Abs: 5.1 10*3/uL (ref 1.7–7.7)
Neutrophils Relative %: 71 %
Platelet Count: 201 10*3/uL (ref 150–400)
RBC: 3.74 MIL/uL — ABNORMAL LOW (ref 4.22–5.81)
RDW: 21.5 % — ABNORMAL HIGH (ref 11.5–15.5)
WBC Count: 7.2 10*3/uL (ref 4.0–10.5)
nRBC: 0 % (ref 0.0–0.2)

## 2023-01-21 LAB — SAMPLE TO BLOOD BANK

## 2023-01-21 MED ORDER — PALONOSETRON HCL INJECTION 0.25 MG/5ML
0.2500 mg | Freq: Once | INTRAVENOUS | Status: AC
Start: 2023-01-21 — End: 2023-01-21
  Administered 2023-01-21: 0.25 mg via INTRAVENOUS
  Filled 2023-01-21: qty 5

## 2023-01-21 MED ORDER — LEUCOVORIN CALCIUM INJECTION 350 MG
400.0000 mg/m2 | Freq: Once | INTRAVENOUS | Status: AC
  Administered 2023-01-21: 928 mg via INTRAVENOUS
  Filled 2023-01-21: qty 46.4

## 2023-01-21 MED ORDER — SODIUM CHLORIDE 0.9% FLUSH
10.0000 mL | INTRAVENOUS | Status: DC | PRN
Start: 2023-01-21 — End: 2023-01-21
  Administered 2023-01-21: 10 mL
  Filled 2023-01-21: qty 10

## 2023-01-21 MED ORDER — DEXAMETHASONE SODIUM PHOSPHATE 10 MG/ML IJ SOLN
10.0000 mg | Freq: Once | INTRAMUSCULAR | Status: AC
  Administered 2023-01-21: 10 mg via INTRAVENOUS
  Filled 2023-01-21: qty 1

## 2023-01-21 MED ORDER — OXALIPLATIN CHEMO INJECTION 100 MG/20ML
65.0000 mg/m2 | Freq: Once | INTRAVENOUS | Status: AC
  Administered 2023-01-21: 150 mg via INTRAVENOUS
  Filled 2023-01-21: qty 10

## 2023-01-21 MED ORDER — SODIUM CHLORIDE 0.9 % IV SOLN
2400.0000 mg/m2 | INTRAVENOUS | Status: DC
  Administered 2023-01-21: 5550 mg via INTRAVENOUS
  Filled 2023-01-21: qty 111

## 2023-01-21 MED ORDER — DEXTROSE 5 % IV SOLN
Freq: Once | INTRAVENOUS | Status: AC
  Filled 2023-01-21: qty 250

## 2023-01-21 NOTE — Assessment & Plan Note (Signed)
Lab Results  Component Value Date   HGB 10.3 (L) 01/21/2023   TIBC 287 01/07/2023   IRONPCTSAT 15 (L) 01/07/2023   FERRITIN 118 01/07/2023    Hb is stable. Close monitor.

## 2023-01-21 NOTE — Assessment & Plan Note (Signed)
LFT is stable.

## 2023-01-21 NOTE — Patient Instructions (Signed)
[place] [facility]  Discharge Instructions: Thank you for choosing [facility] to provide your oncology and hematology care.  If you have a lab appointment with the Cancer Center, please go directly to the Cancer Center and check in at the registration area.  Wear comfortable clothing and clothing appropriate for easy access to any Portacath or PICC line.   We strive to give you quality time with your provider. You may need to reschedule your appointment if you arrive late (15 or more minutes).  Arriving late affects you and other patients whose appointments are after yours.  Also, if you miss three or more appointments without notifying the office, you may be dismissed from the clinic at the provider's discretion.      For prescription refill requests, have your pharmacy contact our office and allow 72 hours for refills to be completed.    Today you received the following chemotherapy and/or immunotherapy agents: fluorouracil/ leucovorin/ oxaliplatin   To help prevent nausea and vomiting after your treatment, we encourage you to take your nausea medication as directed.  BELOW ARE SYMPTOMS THAT SHOULD BE REPORTED IMMEDIATELY: *FEVER GREATER THAN 100.4 F (38 C) OR HIGHER *CHILLS OR SWEATING *NAUSEA AND VOMITING THAT IS NOT CONTROLLED WITH YOUR NAUSEA MEDICATION *UNUSUAL SHORTNESS OF BREATH *UNUSUAL BRUISING OR BLEEDING *URINARY PROBLEMS (pain or burning when urinating, or frequent urination) *BOWEL PROBLEMS (unusual diarrhea, constipation, pain near the anus) TENDERNESS IN MOUTH AND THROAT WITH OR WITHOUT PRESENCE OF ULCERS (sore throat, sores in mouth, or a toothache) UNUSUAL RASH, SWELLING OR PAIN  UNUSUAL VAGINAL DISCHARGE OR ITCHING   Items with * indicate a potential emergency and should be followed up as soon as possible or go to the Emergency Department if any problems should occur.  Please show the CHEMOTHERAPY ALERT CARD or IMMUNOTHERAPY  ALERT CARD at check-in to the Emergency Department and triage nurse.  Should you have questions after your visit or need to cancel or reschedule your appointment, please contact [place] [facility]  [phone] and follow the prompts.  Office hours are 8:00 a.m. to 4:30 p.m. Monday - Friday. Please note that voicemails left after 4:00 p.m. may not be returned until the following business day.  We are closed weekends and major holidays. You have access to a nurse at all times for urgent questions. Please call the main number to the clinic [phone] and follow the prompts.  For any non-urgent questions, you may also contact your provider using MyChart. We now offer e-Visits for anyone 62 and older to request care online for non-urgent symptoms. For details visit [email or web address].   Also download the MyChart app! Go to the app store, search "MyChart", open the app, select , and log in with your MyChart username and password.

## 2023-01-21 NOTE — Assessment & Plan Note (Signed)
Recommend Ativan 0.5 mg every 12 hours as needed for anxiety/sleep/nausea 

## 2023-01-21 NOTE — Progress Notes (Signed)
Hematology/Oncology Progress note [phone] [phone]    CHIEF COMPLAINTS/PURPOSE OF CONSULTATION:  GE junction cancer/gastric cardia cancer.  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  PD-L1 TPS<1%, CPS 3, TMB 8.4, MSI can not be accessed. Labs are reviewed and discussed with patient. Proceed with 5-FU +Oxaliplatin 65mg /m2 PET scan Oct 2024 -good response, discussed with patient and sister.   Chemotherapy-induced neuropathy (HCC) He declined  gabapentin 200mg  BID  Other cirrhosis of liver (HCC) LFT is stable.   IDA (iron deficiency anemia) Lab Results  Component Value Date   HGB 10.3 (L) 01/21/2023   TIBC 287 01/07/2023   IRONPCTSAT 15 (L) 01/07/2023   FERRITIN 118 01/07/2023    Hb is stable. Close monitor.     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 MRI of the brain with and without contrast showed subacute infarct in the left frontal operculum with petechial hemorrhage and mild surrounding edema. No significant mass effect or midline shift. MRA negative foir LVO. The MRI findings of left frontal operculum does not seem to explain his left-sided numbness. Inpatient neurologist during admission recommend PET brain or follow up with another MRI. 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 Barbaraann Cao who feels that clinical syndrome c/w left frontal infarct, secondary to cardioembolism.  Language deficits are stable, left sided  numbness has not persisted.  Right arm numbness and tingling is likely secondary to chronic compression and oxaliplatin toxicity.     Orders Placed This Encounter  Procedures   CEA    Standing Status:   Future    Standing Expiration Date:   02/04/2024   CBC with Differential (Cancer Center Only)    Standing Status:   Future    Standing Expiration Date:   02/04/2024   CMP (Cancer Center only)    Standing Status:   Future    Standing Expiration Date:   02/04/2024   CEA    Standing Status:   Future    Standing Expiration Date:   02/18/2024   CBC with Differential (Cancer Center Only)    Standing Status:   Future    Standing Expiration Date:   02/18/2024   CMP (Cancer Center only)    Standing Status:   Future    Standing Expiration Date:   02/18/2024   CEA    Standing Status:   Future    Standing Expiration Date:   03/03/2024   CBC with Differential (Cancer Center Only)    Standing Status:   Future    Standing Expiration Date:   03/03/2024   CMP (Cancer Center only)    Standing Status:   Future    Standing Expiration Date:   03/03/2024   CEA    Standing Status:   Future    Standing Expiration Date:   03/17/2024   CBC with Differential (Cancer Center Only)    Standing Status:   Future    Standing Expiration Date:   03/17/2024   CMP (Cancer Center only)    Standing Status:  Future    Standing Expiration Date:   03/17/2024    2 weeks lab MD FOLFOX  All questions were answered. The patient knows to call the clinic with any problems, questions or concerns.  Rickard Patience, MD, PhD [facility] Hematology Oncology 01/21/2023    HISTORY OF PRESENTING ILLNESS:  Jeremiah Vance 52 y.o. male presents to establish care for GE junction/gastric cardia adenocarcinoma I have reviewed his chart and materials related to his cancer extensively and collaborated history with the patient. Summary of oncologic history is as follows: Oncology History  Gastric cancer (HCC)  09/08/2022 Imaging   CT  abdomen pelvis w contrast     09/08/2022 Imaging   CT abdomen pelvis with contrast  Diffuse masslike thickening in the area of the GE junction and upper stomach with the extensive abnormal lymph nodes throughout the abdomen including retrocrural, porta hepatis, retroperitoneum and lesser and greater curve of the stomach. In addition there are areas of suggest peritoneal carcinomatosis. Recommend further evaluation.    No bowel obstruction. Large midline anterior pelvic wall hernia involving small bowel and mesenteric fat with rectus muscle diastasis. Gallstones.   09/19/2022 Cancer Staging   Staging form: Stomach, AJCC 8th Edition - Clinical stage from 09/19/2022: Stage IVB (cTX, cN2, cM1) - Signed by Rickard Patience, MD on 09/19/2022 Stage prefix: Initial diagnosis   09/19/2022 Initial Diagnosis   Gastric cancer (HCC)  09/08/2022, patient presented emergency room for evaluation of abdominal pain and vomiting.  He has also had weight loss.  Dysphagia with solid food for couple of months.  CT abdomen pelvis showed diffuse masslike thickening of the GE junction and upper stomach with extensive abnormal lymph nodes throughout abdomen.  09/10/2022 upper endoscopy showed malignant appearance gastric tumor at the GE junction and in the cardia Biopsied.  Nodular mucosa in the second portion of duodenum.  Biopsied. GE junction mass showed moderate to poorly differentiated adenocarcinoma undermining squamous mucosa with focal area of squamous atypia.  Cannot exclude adenosquamous carcinoma.  Intestinal metaplasia is not identified. Duodenum cold biopsy-reactive duodenitis.  Negative for dysplasia and malignancy. Stomach random biopsy negative for H. pylori, intestinal metaplasia, dysplasia and malignancy.  Stomach Cardia mass biopsy showed moderate to poorly differentiated adenocarcinoma.  Patient's case was discussed on tumor board on 09/19/2022.  Per Dr. Oneita Kras, nephrology findings of both GE junction mass and  gastric cardia mass or similar. HER2 staining negative. PD-L1 TPS<1%, CPS 3, TMB 8.4, MSI can not be accessed.  KMT2D frame shift, GATA6 copy number gain.  Molecular testing is pending.     09/23/2022 Imaging   PET scan showed 1. 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 2. Enlarged cirrhotic liver. 3. Cholelithiasis. 4. Bilateral renal stones. 5. Umbilical hernias contain unobstructed small bowel and/or fat.  6. Aortic atherosclerosis (ICD10-I70.0). Coronary artery calcification. 7. Enlarged pulmonic trunk, indicative of pulmonary arterial hypertension.    10/02/2022 -  Chemotherapy   Patient is on Treatment Plan : GASTROESOPHAGEAL FOLFOX q14d     12/31/2022 Imaging   PET showed Significant interval response to therapy.   Residual masslike thickening at the gastric cardia, corresponding to the patient's known primary gastric carcinoma, with decreased hypermetabolic activity.   Near complete resolution of prior hypermetabolic thoracic and upper abdominal lymphadenopathy. Residual AP window and perigastric nodes.   Peritoneal disease is no longer evident.   He lives at home with his sister.  Patient is undergoing neuro physical therapy. He has a history of hemorrhagic CVA in  August 2023, congestive heart failure, CKD 3, hypertension, diabetes. A fib previously on Eliquis, which was held due to severe iron deficiency anemia.  Restarted in August 2024 due to subacute CVA.  11/21/2022 - 11/27/2022, patient was hospitalized.  He initially presented to emergency room due to left sided numbness.  MRI of the brain showed subacute infarct in the left frontal lobe with petechial hemorrhage and mild surrounding edema.  He has no right-sided neurologic deficits.  MRI finding was not explaining left-sided numbness.  He has chronic left sided weakness due to previous CVA.  Neurology was consulted and patient was started on Eliquis for anticoagulation  Eliquis 2.5 mg twice daily and lamotrigine for possible central pain syndrome.  Patient was discharged to rehab and was recently discharged.  S/p fall, minimally displaced distal left clavicular fracture.   INTERVAL HISTORY Oakes D Byron is a 53 y.o. male who has above history reviewed by me today presents for follow up visit for stage IV GE junction/gastric cancer.    He takes oxycodone for pain, pain is controlled.  Denies any blood in the stool since the start of anticoagulation.  Appetite is good.  Weight is stable.  He presents for evaluation prior to chemotherapy.  MEDICAL HISTORY:  Past Medical History:  Diagnosis Date   Acute ischemic stroke (HCC) 09/06/2017   Acute renal failure superimposed on stage 3a chronic kidney disease (HCC) 07/08/2019   Acute right-sided weakness    Allergies    Anasarca    Anemia 07/10/2017   Arthritis    Cancer (HCC)    stomach and esophageal cancer stage 4   CHF (congestive heart failure) (HCC)    "coinsided w/kidney problems I was having 06/2017"   Chicken pox    CKD (chronic kidney disease) stage 3, GFR 30-59 ml/min (HCC) 06/2017   Depression    Elevated troponin 07/08/2019   High cholesterol    History of cardiomyopathy    LVEF 40 to 45% in April 2019 - subsequently normalized   Hyperbilirubinemia 07/10/2017   Hypertension    Ischemic stroke (HCC)    Small left internal capsule infarct due to lacunar disease   Morbid obesity (HCC)    Normocytic anemia 12/16/2017   Recurrent incisional hernia with incarceration s/p repair 10/22/2017 10/21/2017   Stroke (HCC) 08/2017   "right sided weakness since; getting stronger though" (10/22/2017)   Type 2 diabetes mellitus (HCC)     SURGICAL HISTORY: Past Surgical History:  Procedure Laterality Date   ABDOMINAL HERNIA REPAIR  2008; 10/22/2017   "scope; OPEN REPAIR INCARCERATED VENTRAL HERNIA   ESOPHAGOGASTRODUODENOSCOPY (EGD) WITH PROPOFOL N/A 09/10/2022   Procedure: ESOPHAGOGASTRODUODENOSCOPY  (EGD) WITH PROPOFOL;  Surgeon: Toney Reil, MD;  Location: [facility] ENDOSCOPY;  Service: Gastroenterology;  Laterality: N/A;   ESOPHAGOGASTRODUODENOSCOPY (EGD) WITH PROPOFOL N/A 09/28/2022   Procedure: ESOPHAGOGASTRODUODENOSCOPY (EGD) WITH PROPOFOL;  Surgeon: Jaynie Collins, DO;  Location: [facility] ENDOSCOPY;  Service: Gastroenterology;  Laterality: N/A;   HEMOSTASIS CONTROL  09/28/2022   Procedure: HEMOSTASIS CONTROL;  Surgeon: Jaynie Collins, DO;  Location: [facility] ENDOSCOPY;  Service: Gastroenterology;;   HERNIA REPAIR     KNEE ARTHROSCOPY Right 1989   PORTA CATH INSERTION N/A 09/25/2022   Procedure: PORTA CATH INSERTION;  Surgeon: Annice Needy, MD;  Location: [facility] INVASIVE CV LAB;  Service: Cardiovascular;  Laterality: N/A;   VENTRAL HERNIA REPAIR N/A 10/22/2017   Procedure: OPEN REPAIR INCARCERATED VENTRAL HERNIA;  Surgeon: Glenna Fellows, MD;  Location: [facility] OR;  Service: General;  Laterality: N/A;    SOCIAL HISTORY: Social History   Socioeconomic History   Marital status: Divorced    Spouse name: Not on file   Number of children: Not on file   Years of education: Not on file   Highest education level: Not on file  Occupational History   Not on file  Tobacco Use   Smoking status: Never   Smokeless tobacco: Never  Vaping Use   Vaping status: Never Used  Substance and Sexual Activity   Alcohol use: Yes    Comment: occasional beer   Drug use: Never   Sexual activity: Not Currently  Other Topics Concern   Not on file  Social History Narrative   He lives with his sister , [name] and she is his MPOA after his stokes   Social Determinants of Health   Financial Resource Strain: High Risk (03/20/2020)   Overall Financial Resource Strain (CARDIA)    Difficulty of Paying Living Expenses: Hard  Food Insecurity: No Food Insecurity (11/27/2022)   Hunger Vital Sign    Worried About Running Out of Food in the Last Year: Never true    Ran Out of Food in the Last Year:  Never true  Transportation Needs: No Transportation Needs (11/27/2022)   PRAPARE - Administrator, Civil Service (Medical): No    Lack of Transportation (Non-Medical): No  Recent Concern: Transportation Needs - Unmet Transportation Needs (09/27/2022)   PRAPARE - Administrator, Civil Service (Medical): No    Lack of Transportation (Non-Medical): Yes  Physical Activity: Not on file  Stress: Not on file  Social Connections: Not on file  Intimate Partner Violence: Not At Risk (11/27/2022)   Humiliation, Afraid, Rape, and Kick questionnaire    Fear of Current or Ex-Partner: No    Emotionally Abused: No    Physically Abused: No    Sexually Abused: No    FAMILY HISTORY: Family History  Problem Relation Age of Onset   Diabetes Mother    Stroke Mother    Arthritis Mother    Depression Mother    Heart disease Mother    Hypertension Mother    Learning disabilities Mother    Mental illness Mother    Sleep apnea Mother    Diabetes Father    Heart disease Father    Arthritis Father    Hearing loss Father    Hyperlipidemia Father    Heart attack Father    Hypertension Father    Stroke Father    Diabetes Sister    Depression Sister    Diabetes Sister    Hypertension Sister    Mental illness Sister    Sleep apnea Sister    Diabetes Maternal Grandmother    Heart disease Maternal Grandmother    Depression Maternal Grandmother    Hyperlipidemia Maternal Grandmother    Hypertension Maternal Grandmother    Stroke Maternal Grandmother    Hypertension Maternal Grandfather    Hyperlipidemia Maternal Grandfather    Stroke Maternal Grandfather    Arthritis Paternal Grandmother    Hearing loss Paternal Grandmother    Stroke Paternal Grandfather    Heart disease Paternal Grandfather    Arthritis Paternal Grandfather    Heart attack Paternal Grandfather    Melanoma Other     ALLERGIES:  is allergic to pollen extract and sulfa antibiotics.  MEDICATIONS:   Current Outpatient Medications  Medication Sig Dispense Refill   acetaminophen (TYLENOL) 325 MG tablet Take 2 tablets (650 mg  total) by mouth every 4 (four) hours as needed for mild pain (or temp > 37.5 C (99.5 F)).     apixaban (ELIQUIS) 2.5 MG TABS tablet Take 1 tablet (2.5 mg total) by mouth 2 (two) times daily. 60 tablet 2   atorvastatin (LIPITOR) 40 MG tablet Take 1 tablet (40 mg total) by mouth daily at 6 PM. 90 tablet 3   busPIRone (BUSPAR) 7.5 MG tablet Take 1 tablet (7.5 mg total) by mouth 2 (two) times daily. 180 tablet 1   dapagliflozin propanediol (FARXIGA) 10 MG TABS tablet Take 1 tablet (10 mg total) by mouth daily. 30 tablet 0   dexamethasone (DECADRON) 4 MG tablet TAKE 2 TABLETS(8 MG) BY MOUTH DAILY. START THE DAY AFTER CHEMOTHERAPY FOR 2 DAYS. TAKE WITH FOOD 30 tablet 1   diclofenac Sodium (VOLTAREN) 1 % GEL Apply 4 g topically 4 (four) times daily.     hydrALAZINE (APRESOLINE) 25 MG tablet Take 1 tablet (25 mg total) by mouth every 8 (eight) hours. 90 tablet 0   oxyCODONE (OXY IR/ROXICODONE) 5 MG immediate release tablet Take 1 tablet (5 mg total) by mouth every 6 (six) hours as needed for severe pain. 60 tablet 0   pantoprazole (PROTONIX) 40 MG tablet Take 1 tablet (40 mg total) by mouth 2 (two) times daily before a meal. 60 tablet 3   scopolamine (TRANSDERM-SCOP) 1 MG/3DAYS Place 1 patch (1.5 mg total) onto the skin every 3 (three) days. (Patient taking differently: Place 1 patch onto the skin every 3 (three) days as needed.) 4 patch 0   sertraline (ZOLOFT) 50 MG tablet Take 1 tablet (50 mg total) by mouth daily. 30 tablet 3   torsemide (DEMADEX) 10 MG tablet Take 1 tablet (10 mg total) by mouth daily as needed (lower ext swelling). 30 tablet 0   Continuous Glucose Sensor (FREESTYLE LIBRE 2 SENSOR) MISC Place 1 sensor on the skin every 14 days. Use to check glucose continuously (Patient not taking: Reported on 01/02/2023) 2 each 11   gabapentin (NEURONTIN) 100 MG capsule Take 2  capsules (200 mg total) by mouth 2 (two) times daily. (Patient not taking: Reported on 01/21/2023) 120 capsule 0   ondansetron (ZOFRAN-ODT) 8 MG disintegrating tablet Take 1 tablet (8 mg total) by mouth every 8 (eight) hours as needed for nausea or vomiting. (Patient not taking: Reported on 01/17/2023) 60 tablet 1   prochlorperazine (COMPAZINE) 10 MG tablet Take 1 tablet (10 mg total) by mouth every 6 (six) hours as needed for nausea or vomiting. (Patient not taking: Reported on 01/17/2023) 60 tablet 0   No current facility-administered medications for this visit.   Facility-Administered Medications Ordered in Other Visits  Medication Dose Route Frequency Provider Last Rate Last Admin   fluorouracil (ADRUCIL) 5,550 mg in sodium chloride 0.9 % 139 mL chemo infusion  2,400 mg/m2 (Treatment Plan Recorded) Intravenous 1 day or 1 dose Rickard Patience, MD       leucovorin 928 mg in dextrose 5 % 250 mL infusion  400 mg/m2 (Treatment Plan Recorded) Intravenous Once Rickard Patience, MD 148 mL/hr at 01/21/23 1016 928 mg at 01/21/23 1016   oxaliplatin (ELOXATIN) 150 mg in dextrose 5 % 500 mL chemo infusion  65 mg/m2 (Treatment Plan Recorded) Intravenous Once Rickard Patience, MD 265 mL/hr at 01/21/23 1017 150 mg at 01/21/23 1017   sodium chloride flush (NS) 0.9 % injection 10 mL  10 mL Intracatheter PRN Rickard Patience, MD   10 mL at 01/21/23 [phone]  Review of Systems  Constitutional:  Positive for fatigue. Negative for appetite change, chills, fever and unexpected weight change.  HENT:   Negative for hearing loss and voice change.   Eyes:  Negative for eye problems and icterus.  Respiratory:  Negative for chest tightness, cough and shortness of breath.   Cardiovascular:  Negative for chest pain and leg swelling.  Gastrointestinal:  Negative for abdominal distention, abdominal pain and nausea.  Endocrine: Negative for hot flashes.  Genitourinary:  Negative for difficulty urinating, dysuria and frequency.   Musculoskeletal:  Negative  for arthralgias.  Skin:  Negative for itching and rash.  Neurological:  Positive for numbness. Negative for light-headedness.  Hematological:  Negative for adenopathy. Does not bruise/bleed easily.  Psychiatric/Behavioral:  Negative for confusion.      PHYSICAL EXAMINATION: ECOG PERFORMANCE STATUS: 2 - Symptomatic, <50% confined to bed  Vitals:   01/21/23 0846  BP: 132/81  Pulse: 76  Resp: 18  Temp: (!) 97.5 F (36.4 C)  SpO2: 98%   Filed Weights   01/21/23 0846  Weight: 258 lb 6.4 oz (117.2 kg)    Physical Exam Constitutional:      General: He is not in acute distress.    Appearance: He is obese. He is not diaphoretic.  HENT:     Head: Normocephalic and atraumatic.  Eyes:     General: No scleral icterus.    Pupils: Pupils are equal, round, and reactive to light.  Cardiovascular:     Rate and Rhythm: Normal rate.  Pulmonary:     Effort: Pulmonary effort is normal. No respiratory distress.  Abdominal:     General: There is no distension.     Palpations: Abdomen is soft.     Tenderness: There is no abdominal tenderness.  Musculoskeletal:        General: Normal range of motion.     Cervical back: Normal range of motion and neck supple.     Left lower leg: Edema present.     Comments: Left shoulder sling   Skin:    General: Skin is warm and dry.  Neurological:     Mental Status: He is alert and oriented to person, place, and time. Mental status is at baseline.     Cranial Nerves: No cranial nerve deficit.     Motor: No abnormal muscle tone.  Psychiatric:        Mood and Affect: Mood and affect normal.      LABORATORY DATA:  I have reviewed the data as listed    Latest Ref Rng & Units 01/21/2023    8:17 AM 01/07/2023    8:00 AM 12/24/2022    8:50 AM  CBC  WBC 4.0 - 10.5 K/uL 7.2  5.5  4.7   Hemoglobin 13.0 - 17.0 g/dL 16.1  09.6  04.5   Hematocrit 39.0 - 52.0 % 32.6  32.4  37.3   Platelets 150 - 400 K/uL 201  184  245       Latest Ref Rng & Units  01/21/2023    8:17 AM 01/07/2023    8:04 AM 12/24/2022    8:50 AM  CMP  Glucose 70 - 99 mg/dL 409  811  914   BUN 6 - 20 mg/dL 40  29  22   Creatinine 0.61 - 1.24 mg/dL 7.82  9.56  2.13   Sodium 135 - 145 mmol/L 139  137  137   Potassium 3.5 - 5.1 mmol/L 3.8  3.7  4.0  Chloride 98 - 111 mmol/L 107  108  110   CO2 22 - 32 mmol/L 24  22  20    Calcium 8.9 - 10.3 mg/dL 8.6  8.5  8.7   Total Protein 6.5 - 8.1 g/dL 6.9  6.9  7.4   Total Bilirubin 0.3 - 1.2 mg/dL 0.8  0.9  0.6   Alkaline Phos 38 - 126 U/L 112  95  99   AST 15 - 41 U/L 21  19  20    ALT 0 - 44 U/L 21  17  18       RADIOGRAPHIC STUDIES: I have personally reviewed the radiological images as listed and agreed with the findings in the report. NM PET Image Restag (PS) Skull Base To Thigh  Result Date: 01/07/2023 CLINICAL DATA:  Subsequent treatment strategy for gastric cancer. EXAM: NUCLEAR MEDICINE PET SKULL BASE TO THIGH TECHNIQUE: 12.0 mCi F-18 FDG was injected intravenously. Full-ring PET imaging was performed from the skull base to thigh after the radiotracer. CT data was obtained and used for attenuation correction and anatomic localization. Fasting blood glucose: 95 mg/dl COMPARISON:  PET-CT dated 09/18/2022 FINDINGS: Mediastinal blood pool activity: SUV max 2.4 Liver activity: SUV max NA NECK: No hypermetabolic lymph nodes in the neck. Incidental CT findings: None. CHEST: No suspicious pulmonary nodules. Near complete resolution of prior hypermetabolic thoracic lymphadenopathy. Residual 18 mm short axis AP window node (series 6/image 49), max SUV 4.7. Right chest port terminating in the upper right atrium. Incidental CT findings: Cardiomegaly. Mild atherosclerotic calcifications of the descending thoracic aorta. Mild coronary atherosclerosis of the LAD. ABDOMEN/PELVIS: Residual masslike thickening at the gastric cardia (series 6/image 36), corresponding to the patient's known primary gastric carcinoma, max SUV 5.5 (previously 16.8).  Near complete resolution of prior upper abdominal and retroperitoneal lymphadenopathy. Residual 3.7 x 2.1 cm perigastric node in the left upper abdomen (series 6/image 81), max SUV 5.0. Additional small peritoneal implants are no longer evident. Incidental CT findings: Cholelithiasis, without associated inflammatory changes. Vascular calcifications. Large midline lower ventral hernia containing cecum and loops of small bowel, without fluid/inflammatory changes. SKELETON: No focal hypermetabolic activity to suggest skeletal metastasis. Incidental CT findings: Degenerative changes of the visualized thoracolumbar spine. IMPRESSION: Significant interval response to therapy. Residual masslike thickening at the gastric cardia, corresponding to the patient's known primary gastric carcinoma, with decreased hypermetabolic activity. Near complete resolution of prior hypermetabolic thoracic and upper abdominal lymphadenopathy. Residual AP window and perigastric nodes. Peritoneal disease is no longer evident. Electronically Signed   By: Charline Bills M.D.   On: 01/07/2023 08:47   DG Shoulder Left  Result Date: 01/04/2023 CLINICAL DATA:  Fall, left shoulder pain EXAM: LEFT SHOULDER - 2+ VIEW COMPARISON:  None FINDINGS: There is a acute, minimally displaced fracture of the distal left clavicle distal to the expected coracoclavicular articulation with fracture fragments in near anatomic alignment. Acromioclavicular joint space is preserved. Glenohumeral joint space is preserved. No other fracture identified. No dislocation. Limited evaluation of the left hemithorax is unremarkable. IMPRESSION: 1. Acute, minimally displaced distal left clavicular fracture. Electronically Signed   By: Helyn Numbers M.D.   On: 01/04/2023 23:27

## 2023-01-21 NOTE — Progress Notes (Signed)
Pt here for follow up. No new concerns voiced.   

## 2023-01-21 NOTE — Assessment & Plan Note (Addendum)
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  PD-L1 TPS<1%, CPS 3, TMB 8.4, MSI can not be accessed. Labs are reviewed and discussed with patient. Proceed with 5-FU +Oxaliplatin 65mg /m2 PET scan Oct 2024 -good response, discussed with patient and sister.

## 2023-01-21 NOTE — Assessment & Plan Note (Signed)
He declined  gabapentin 200mg  BID

## 2023-01-21 NOTE — Assessment & Plan Note (Signed)
pt has hx of stroke, now has left-sided numbness. MRI of the brain with and without contrast showed subacute infarct in the left frontal operculum with petechial hemorrhage and mild surrounding edema. No significant mass effect or midline shift. MRA negative foir LVO. The MRI findings of left frontal operculum does not seem to explain his left-sided numbness. Inpatient neurologist during admission recommend PET brain or follow up with another MRI. 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 Barbaraann Cao who feels that clinical syndrome c/w left frontal infarct, secondary to cardioembolism.  Language deficits are stable, left sided numbness has not persisted.  Right arm numbness and tingling is likely secondary to chronic compression and oxaliplatin toxicity.

## 2023-01-22 ENCOUNTER — Ambulatory Visit: Payer: Medicaid Other | Admitting: Physical Therapy

## 2023-01-22 LAB — CEA: CEA: 3.8 ng/mL (ref 0.0–4.7)

## 2023-01-23 ENCOUNTER — Inpatient Hospital Stay: Payer: Medicaid Other

## 2023-01-23 VITALS — BP 140/83 | HR 80 | Resp 18

## 2023-01-23 DIAGNOSIS — Z5111 Encounter for antineoplastic chemotherapy: Secondary | ICD-10-CM | POA: Diagnosis not present

## 2023-01-23 DIAGNOSIS — C160 Malignant neoplasm of cardia: Secondary | ICD-10-CM

## 2023-01-23 MED ORDER — HEPARIN SOD (PORK) LOCK FLUSH 100 UNIT/ML IV SOLN
500.0000 [IU] | Freq: Once | INTRAVENOUS | Status: AC | PRN
  Administered 2023-01-23: 500 [IU]
  Filled 2023-01-23: qty 5

## 2023-01-23 MED ORDER — SODIUM CHLORIDE 0.9% FLUSH
10.0000 mL | INTRAVENOUS | Status: DC | PRN
Start: 2023-01-23 — End: 2023-01-23
  Administered 2023-01-23: 10 mL
  Filled 2023-01-23: qty 10

## 2023-01-24 ENCOUNTER — Encounter: Payer: Medicaid Other | Attending: Registered Nurse | Admitting: Physical Medicine & Rehabilitation

## 2023-01-24 DIAGNOSIS — E7849 Other hyperlipidemia: Secondary | ICD-10-CM | POA: Insufficient documentation

## 2023-01-24 DIAGNOSIS — I10 Essential (primary) hypertension: Secondary | ICD-10-CM | POA: Insufficient documentation

## 2023-01-24 DIAGNOSIS — I639 Cerebral infarction, unspecified: Secondary | ICD-10-CM | POA: Insufficient documentation

## 2023-01-24 DIAGNOSIS — I480 Paroxysmal atrial fibrillation: Secondary | ICD-10-CM | POA: Insufficient documentation

## 2023-01-27 ENCOUNTER — Ambulatory Visit: Payer: Medicaid Other | Admitting: Physical Therapy

## 2023-01-28 ENCOUNTER — Encounter: Payer: Self-pay | Admitting: Physician Assistant

## 2023-01-28 DIAGNOSIS — I10 Essential (primary) hypertension: Secondary | ICD-10-CM

## 2023-01-28 DIAGNOSIS — F4321 Adjustment disorder with depressed mood: Secondary | ICD-10-CM

## 2023-01-28 DIAGNOSIS — F419 Anxiety disorder, unspecified: Secondary | ICD-10-CM

## 2023-01-29 ENCOUNTER — Ambulatory Visit: Payer: Medicaid Other | Admitting: Physical Therapy

## 2023-01-29 NOTE — Telephone Encounter (Signed)
Not sure about refilling the Sertraline. Visit from 10/29/2022 "Will start low dose zoloft at 50 mg Recommend 6 week f/u with pcp"  He was just seen 01/01/2023 for follow-up but I don't see it was address.

## 2023-02-02 DIAGNOSIS — C160 Malignant neoplasm of cardia: Secondary | ICD-10-CM | POA: Diagnosis not present

## 2023-02-03 ENCOUNTER — Other Ambulatory Visit: Payer: Self-pay

## 2023-02-03 ENCOUNTER — Ambulatory Visit: Payer: Medicaid Other | Admitting: Physical Therapy

## 2023-02-03 MED ORDER — HYDRALAZINE HCL 25 MG PO TABS: 25.0000 mg | ORAL_TABLET | Freq: Three times a day (TID) | ORAL | 0 refills | Status: DC

## 2023-02-03 MED ORDER — ONDANSETRON 8 MG PO TBDP: 8.0000 mg | ORAL_TABLET | Freq: Three times a day (TID) | ORAL | 1 refills | Status: DC | PRN

## 2023-02-03 MED ORDER — SERTRALINE HCL 50 MG PO TABS: 50.0000 mg | ORAL_TABLET | Freq: Every day | ORAL | 3 refills | Status: DC

## 2023-02-03 MED ORDER — BUSPIRONE HCL 7.5 MG PO TABS: 7.5000 mg | ORAL_TABLET | Freq: Two times a day (BID) | ORAL | 1 refills | Status: DC

## 2023-02-04 ENCOUNTER — Encounter: Payer: Self-pay | Admitting: Oncology

## 2023-02-04 ENCOUNTER — Inpatient Hospital Stay: Payer: Medicaid Other | Attending: Oncology

## 2023-02-04 ENCOUNTER — Inpatient Hospital Stay: Payer: Medicaid Other

## 2023-02-04 ENCOUNTER — Inpatient Hospital Stay (HOSPITAL_BASED_OUTPATIENT_CLINIC_OR_DEPARTMENT_OTHER): Payer: Medicaid Other | Admitting: Oncology

## 2023-02-04 VITALS — BP 156/93 | HR 95 | Temp 97.3°F | Resp 18 | Wt 254.5 lb

## 2023-02-04 DIAGNOSIS — T451X5A Adverse effect of antineoplastic and immunosuppressive drugs, initial encounter: Secondary | ICD-10-CM | POA: Diagnosis not present

## 2023-02-04 DIAGNOSIS — C160 Malignant neoplasm of cardia: Secondary | ICD-10-CM

## 2023-02-04 DIAGNOSIS — K7469 Other cirrhosis of liver: Secondary | ICD-10-CM

## 2023-02-04 DIAGNOSIS — Z79899 Other long term (current) drug therapy: Secondary | ICD-10-CM | POA: Diagnosis not present

## 2023-02-04 DIAGNOSIS — Z5111 Encounter for antineoplastic chemotherapy: Secondary | ICD-10-CM | POA: Insufficient documentation

## 2023-02-04 DIAGNOSIS — G620 Drug-induced polyneuropathy: Secondary | ICD-10-CM | POA: Diagnosis not present

## 2023-02-04 DIAGNOSIS — C801 Malignant (primary) neoplasm, unspecified: Secondary | ICD-10-CM | POA: Diagnosis not present

## 2023-02-04 DIAGNOSIS — F411 Generalized anxiety disorder: Secondary | ICD-10-CM

## 2023-02-04 LAB — CBC WITH DIFFERENTIAL (CANCER CENTER ONLY)
Abs Immature Granulocytes: 0.03 10*3/uL (ref 0.00–0.07)
Basophils Absolute: 0 10*3/uL (ref 0.0–0.1)
Basophils Relative: 0 %
Eosinophils Absolute: 0.3 10*3/uL (ref 0.0–0.5)
Eosinophils Relative: 5 %
HCT: 34.2 % — ABNORMAL LOW (ref 39.0–52.0)
Hemoglobin: 10.7 g/dL — ABNORMAL LOW (ref 13.0–17.0)
Immature Granulocytes: 1 %
Lymphocytes Relative: 13 %
Lymphs Abs: 0.8 10*3/uL (ref 0.7–4.0)
MCH: 27.5 pg (ref 26.0–34.0)
MCHC: 31.3 g/dL (ref 30.0–36.0)
MCV: 87.9 fL (ref 80.0–100.0)
Monocytes Absolute: 0.8 10*3/uL (ref 0.1–1.0)
Monocytes Relative: 13 %
Neutro Abs: 4.3 10*3/uL (ref 1.7–7.7)
Neutrophils Relative %: 68 %
Platelet Count: 148 10*3/uL — ABNORMAL LOW (ref 150–400)
RBC: 3.89 MIL/uL — ABNORMAL LOW (ref 4.22–5.81)
RDW: 20.3 % — ABNORMAL HIGH (ref 11.5–15.5)
WBC Count: 6.3 10*3/uL (ref 4.0–10.5)
nRBC: 0 % (ref 0.0–0.2)

## 2023-02-04 LAB — CMP (CANCER CENTER ONLY)
ALT: 18 U/L (ref 0–44)
AST: 22 U/L (ref 15–41)
Albumin: 3.3 g/dL — ABNORMAL LOW (ref 3.5–5.0)
Alkaline Phosphatase: 113 U/L (ref 38–126)
Anion gap: 7 (ref 5–15)
BUN: 28 mg/dL — ABNORMAL HIGH (ref 6–20)
CO2: 23 mmol/L (ref 22–32)
Calcium: 8.5 mg/dL — ABNORMAL LOW (ref 8.9–10.3)
Chloride: 107 mmol/L (ref 98–111)
Creatinine: 1.31 mg/dL — ABNORMAL HIGH (ref 0.61–1.24)
GFR, Estimated: >60 mL/min (ref >60)
Glucose, Bld: 141 mg/dL — ABNORMAL HIGH (ref 70–99)
Potassium: 3.9 mmol/L (ref 3.5–5.1)
Sodium: 137 mmol/L (ref 135–145)
Total Bilirubin: 0.8 mg/dL (ref <1.2)
Total Protein: 6.9 g/dL (ref 6.5–8.1)

## 2023-02-04 MED ORDER — DEXTROSE 5 % IV SOLN
65.0000 mg/m2 | Freq: Once | INTRAVENOUS | Status: AC
  Administered 2023-02-04: 150 mg via INTRAVENOUS
  Filled 2023-02-04: qty 10

## 2023-02-04 MED ORDER — DEXTROSE 5 % IV SOLN
Freq: Once | INTRAVENOUS | Status: AC
  Filled 2023-02-04: qty 250

## 2023-02-04 MED ORDER — OXYCODONE HCL 5 MG PO TABS: 5.0000 mg | ORAL_TABLET | Freq: Four times a day (QID) | ORAL | 0 refills | Status: DC | PRN

## 2023-02-04 MED ORDER — SODIUM CHLORIDE 0.9 % IV SOLN
2400.0000 mg/m2 | INTRAVENOUS | Status: DC
  Administered 2023-02-04: 5550 mg via INTRAVENOUS
  Filled 2023-02-04: qty 111

## 2023-02-04 MED ORDER — DEXAMETHASONE SODIUM PHOSPHATE 10 MG/ML IJ SOLN
10.0000 mg | Freq: Once | INTRAMUSCULAR | Status: AC
  Administered 2023-02-04: 10 mg via INTRAVENOUS
  Filled 2023-02-04: qty 1

## 2023-02-04 MED ORDER — PALONOSETRON HCL INJECTION 0.25 MG/5ML
0.2500 mg | Freq: Once | INTRAVENOUS | Status: AC
  Administered 2023-02-04: 0.25 mg via INTRAVENOUS
  Filled 2023-02-04: qty 5

## 2023-02-04 MED ORDER — DEXTROSE 5 % IV SOLN
400.0000 mg/m2 | Freq: Once | INTRAVENOUS | Status: AC
  Administered 2023-02-04: 928 mg via INTRAVENOUS
  Filled 2023-02-04: qty 46.4

## 2023-02-04 NOTE — Assessment & Plan Note (Signed)
He declined  gabapentin 200mg  BID

## 2023-02-04 NOTE — Patient Instructions (Signed)
[facility] - A DEPT OF [facility]  Discharge Instructions: Thank you for choosing [facility] to provide your oncology and hematology care.  If you have a lab appointment with the Cancer Center, please go directly to the Cancer Center and check in at the registration area.  Wear comfortable clothing and clothing appropriate for easy access to any Portacath or PICC line.   We strive to give you quality time with your provider. You may need to reschedule your appointment if you arrive late (15 or more minutes).  Arriving late affects you and other patients whose appointments are after yours.  Also, if you miss three or more appointments without notifying the office, you may be dismissed from the clinic at the provider's discretion.      For prescription refill requests, have your pharmacy contact our office and allow 72 hours for refills to be completed.    Today you received the following chemotherapy and/or immunotherapy agents Oxaliplatin, Leucovorin & Adrucil      To help prevent nausea and vomiting after your treatment, we encourage you to take your nausea medication as directed.  BELOW ARE SYMPTOMS THAT SHOULD BE REPORTED IMMEDIATELY: *FEVER GREATER THAN 100.4 F (38 C) OR HIGHER *CHILLS OR SWEATING *NAUSEA AND VOMITING THAT IS NOT CONTROLLED WITH YOUR NAUSEA MEDICATION *UNUSUAL SHORTNESS OF BREATH *UNUSUAL BRUISING OR BLEEDING *URINARY PROBLEMS (pain or burning when urinating, or frequent urination) *BOWEL PROBLEMS (unusual diarrhea, constipation, pain near the anus) TENDERNESS IN MOUTH AND THROAT WITH OR WITHOUT PRESENCE OF ULCERS (sore throat, sores in mouth, or a toothache) UNUSUAL RASH, SWELLING OR PAIN  UNUSUAL VAGINAL DISCHARGE OR ITCHING   Items with * indicate a potential emergency and should be followed up as soon as possible or go to the Emergency Department if any problems should occur.  Please show the CHEMOTHERAPY ALERT  CARD or IMMUNOTHERAPY ALERT CARD at check-in to the Emergency Department and triage nurse.  Should you have questions after your visit or need to cancel or reschedule your appointment, please contact [facility] - A DEPT OF [facility]  [phone] and follow the prompts.  Office hours are 8:00 a.m. to 4:30 p.m. Monday - Friday. Please note that voicemails left after 4:00 p.m. may not be returned until the following business day.  We are closed weekends and major holidays. You have access to a nurse at all times for urgent questions. Please call the main number to the clinic [phone] and follow the prompts.  For any non-urgent questions, you may also contact your provider using MyChart. We now offer e-Visits for anyone 68 and older to request care online for non-urgent symptoms. For details visit [email or web address].   Also download the MyChart app! Go to the app store, search "MyChart", open the app, select [facility], and log in with your MyChart username and password.

## 2023-02-04 NOTE — Assessment & Plan Note (Addendum)
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  PD-L1 TPS<1%, CPS 3, TMB 8.4, MSI can not be accessed. PET scan Oct 2024 -good response, discussed with patient and sister.  Labs are reviewed and discussed with patient. Proceed with 5-FU +Oxaliplatin 65mg /m2

## 2023-02-04 NOTE — Progress Notes (Signed)
Pt here for follow up. Pt requesting Oxycodone refill.

## 2023-02-04 NOTE — Assessment & Plan Note (Signed)
LFT is stable.

## 2023-02-04 NOTE — Progress Notes (Signed)
Hematology/Oncology Progress note [phone] [phone]    CHIEF COMPLAINTS/PURPOSE OF CONSULTATION:  GE junction cancer/gastric cardia cancer.  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  PD-L1 TPS<1%, CPS 3, TMB 8.4, MSI can not be accessed. PET scan Oct 2024 -good response, discussed with patient and sister.  Labs are reviewed and discussed with patient. Proceed with 5-FU +Oxaliplatin 65mg /m2   Other cirrhosis of liver (HCC) LFT is stable.   Anxiety associated with cancer diagnosis (HCC) Recommend Ativan 0.5 mg every 12 hours as needed for anxiety/sleep/nausea  Chemotherapy-induced neuropathy (HCC) He declined  gabapentin 200mg  BID  Encounter for antineoplastic chemotherapy Chemotherapy plan as listed above.     No orders of the defined types were placed in this encounter.   2 weeks lab MD FOLFOX  All questions were answered. The patient knows to call the clinic with any problems, questions or concerns.  Rickard Patience, MD, PhD [facility] Hematology Oncology 02/04/2023    HISTORY OF PRESENTING ILLNESS:  Jeremiah Vance 52 y.o. male presents to establish care for GE junction/gastric cardia adenocarcinoma I have reviewed his chart and materials related to his cancer extensively and collaborated history with the patient. Summary of oncologic history is as follows: Oncology History  Gastric cancer (HCC)  09/08/2022 Imaging   CT abdomen pelvis w contrast     09/08/2022 Imaging   CT abdomen pelvis with contrast  Diffuse masslike thickening in the area of the GE junction and upper stomach with the extensive abnormal lymph nodes throughout the abdomen including retrocrural,  porta hepatis, retroperitoneum and lesser and greater curve of the stomach. In addition there are areas of suggest peritoneal carcinomatosis. Recommend further evaluation.    No bowel obstruction. Large midline anterior pelvic wall hernia involving small bowel and mesenteric fat with rectus muscle diastasis. Gallstones.   09/19/2022 Cancer Staging   Staging form: Stomach, AJCC 8th Edition - Clinical stage from 09/19/2022: Stage IVB (cTX, cN2, cM1) - Signed by Rickard Patience, MD on 09/19/2022 Stage prefix: Initial diagnosis   09/19/2022 Initial Diagnosis   Gastric cancer (HCC)  09/08/2022, patient presented emergency room for evaluation of abdominal pain and vomiting.  He has also had weight loss.  Dysphagia with solid food for couple of months.  CT abdomen pelvis showed diffuse masslike thickening of the GE junction and upper stomach with extensive abnormal lymph nodes throughout abdomen.  09/10/2022 upper endoscopy showed malignant appearance gastric tumor at the GE junction and in the cardia Biopsied.  Nodular mucosa in the second portion of duodenum.  Biopsied. GE junction mass showed moderate to poorly differentiated adenocarcinoma undermining squamous mucosa with focal area of squamous atypia.  Cannot exclude adenosquamous carcinoma.  Intestinal metaplasia is not identified. Duodenum cold biopsy-reactive duodenitis.  Negative for dysplasia and malignancy. Stomach random biopsy negative for H. pylori, intestinal metaplasia, dysplasia and malignancy.  Stomach Cardia mass biopsy showed moderate to poorly differentiated adenocarcinoma.  Patient's case was discussed on tumor board on 09/19/2022.  Per Dr. Oneita Kras, nephrology findings of both GE junction mass and gastric cardia mass or similar. HER2 staining negative. PD-L1 TPS<1%, CPS 3, TMB 8.4, MSI can not be accessed.  KMT2D frame shift, GATA6 copy number gain.  Molecular testing is pending.  09/23/2022 Imaging   PET scan showed 1. Gastric  cancer with extensive thoracoabdominal nodal and peritoneal metastatic disease. 2. Enlarged cirrhotic liver. 3. Cholelithiasis. 4. Bilateral renal stones. 5. Umbilical hernias contain unobstructed small bowel and/or fat.  6. Aortic atherosclerosis (ICD10-I70.0). Coronary artery calcification. 7. Enlarged pulmonic trunk, indicative of pulmonary arterial hypertension.    10/02/2022 -  Chemotherapy   Patient is on Treatment Plan : GASTROESOPHAGEAL FOLFOX q14d     12/31/2022 Imaging   PET showed Significant interval response to therapy.   Residual masslike thickening at the gastric cardia, corresponding to the patient's known primary gastric carcinoma, with decreased hypermetabolic activity.   Near complete resolution of prior hypermetabolic thoracic and upper abdominal lymphadenopathy. Residual AP window and perigastric nodes.   Peritoneal disease is no longer evident.   He lives at home with his sister.  Patient is undergoing neuro physical therapy. 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 A fib previously on Eliquis, which was held due to severe iron deficiency anemia.  Restarted in August 2024 due to subacute CVA.  11/21/2022 - 11/27/2022, patient was hospitalized.  He initially presented to emergency room due to left sided numbness.  MRI of the brain showed subacute infarct in the left frontal lobe with petechial hemorrhage and mild surrounding edema.  He has no right-sided neurologic deficits.  MRI finding was not explaining left-sided numbness.  He has chronic left sided weakness due to previous CVA.  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  Patient was discharged to rehab and was recently discharged.  S/p fall, minimally displaced distal left clavicular fracture.   INTERVAL HISTORY Jeremiah Vance is a 52  y.o. male who has above history reviewed by me today presents for follow up visit for stage IV GE junction/gastric cancer.    He takes oxycodone for pain, pain is controlled.  Denies any blood in the stool since the start of anticoagulation.  Appetite is good.  Weight is stable.  He presents for evaluation prior to chemotherapy.  MEDICAL HISTORY:  Past Medical History:  Diagnosis Date   Acute ischemic stroke (HCC) 09/06/2017   Acute renal failure superimposed on stage 3a chronic kidney disease (HCC) 07/08/2019   Acute right-sided weakness    Allergies    Anasarca    Anemia 07/10/2017   Arthritis    Cancer (HCC)    stomach and esophageal cancer stage 4   CHF (congestive heart failure) (HCC)    "coinsided w/kidney problems I was having 06/2017"   Chicken pox    CKD (chronic kidney disease) stage 3, GFR 30-59 ml/min (HCC) 06/2017   Depression    Elevated troponin 07/08/2019   High cholesterol    History of cardiomyopathy    LVEF 40 to 45% in April 2019 - subsequently normalized   Hyperbilirubinemia 07/10/2017   Hypertension    Ischemic stroke (HCC)    Small left internal capsule infarct due to lacunar disease   Morbid obesity (HCC)    Normocytic anemia 12/16/2017   Recurrent incisional hernia with incarceration s/p repair 10/22/2017 10/21/2017   Stroke (HCC) 08/2017   "right sided weakness since; getting stronger though" (10/22/2017)   Type 2 diabetes mellitus (HCC)     SURGICAL HISTORY: Past Surgical History:  Procedure Laterality Date   ABDOMINAL HERNIA REPAIR  2008; 10/22/2017   "scope; OPEN REPAIR INCARCERATED  VENTRAL HERNIA   ESOPHAGOGASTRODUODENOSCOPY (EGD) WITH PROPOFOL N/A 09/10/2022   Procedure: ESOPHAGOGASTRODUODENOSCOPY (EGD) WITH PROPOFOL;  Surgeon: Toney Reil, MD;  Location[facility] ENDOSCOPY;  Service: Gastroenterology;  Laterality: N/A;   ESOPHAGOGASTRODUODENOSCOPY (EGD) WITH PROPOFOL N/A 09/28/2022   Procedure: ESOPHAGOGASTRODUODENOSCOPY (EGD) WITH  PROPOFOL;  Surgeon: Jaynie Collins, DO;  Location: [facility] ENDOSCOPY;  Service: Gastroenterology;  Laterality: N/A;   HEMOSTASIS CONTROL  09/28/2022   Procedure: HEMOSTASIS CONTROL;  Surgeon: Jaynie Collins, DO;  Location: [facility] ENDOSCOPY;  Service: Gastroenterology;;   HERNIA REPAIR     KNEE ARTHROSCOPY Right 1989   PORTA CATH INSERTION N/A 09/25/2022   Procedure: PORTA CATH INSERTION;  Surgeon: Annice Needy, MD;  Location: [facility] INVASIVE CV LAB;  Service: Cardiovascular;  Laterality: N/A;   VENTRAL HERNIA REPAIR N/A 10/22/2017   Procedure: OPEN REPAIR INCARCERATED VENTRAL HERNIA;  Surgeon: Glenna Fellows, MD;  Location: [facility] OR;  Service: General;  Laterality: N/A;    SOCIAL HISTORY: Social History   Socioeconomic History   Marital status: Divorced    Spouse name: Not on file   Number of children: Not on file   Years of education: Not on file   Highest education level: Not on file  Occupational History   Not on file  Tobacco Use   Smoking status: Never   Smokeless tobacco: Never  Vaping Use   Vaping status: Never Used  Substance and Sexual Activity   Alcohol use: Yes    Comment: occasional beer   Drug use: Never   Sexual activity: Not Currently  Other Topics Concern   Not on file  Social History Narrative   He lives with his sister , [name] and she is his MPOA after his stokes   Social Determinants of Health   Financial Resource Strain: High Risk (03/20/2020)   Overall Financial Resource Strain (CARDIA)    Difficulty of Paying Living Expenses: Hard  Food Insecurity: No Food Insecurity (11/27/2022)   Hunger Vital Sign    Worried About Running Out of Food in the Last Year: Never true    Ran Out of Food in the Last Year: Never true  Transportation Needs: No Transportation Needs (11/27/2022)   PRAPARE - Administrator, Civil Service (Medical): No    Lack of Transportation (Non-Medical): No  Recent Concern: Transportation Needs - Unmet Transportation  Needs (09/27/2022)   PRAPARE - Administrator, Civil Service (Medical): No    Lack of Transportation (Non-Medical): Yes  Physical Activity: Not on file  Stress: Not on file  Social Connections: Not on file  Intimate Partner Violence: Not At Risk (11/27/2022)   Humiliation, Afraid, Rape, and Kick questionnaire    Fear of Current or Ex-Partner: No    Emotionally Abused: No    Physically Abused: No    Sexually Abused: No    FAMILY HISTORY: Family History  Problem Relation Age of Onset   Diabetes Mother    Stroke Mother    Arthritis Mother    Depression Mother    Heart disease Mother    Hypertension Mother    Learning disabilities Mother    Mental illness Mother    Sleep apnea Mother    Diabetes Father    Heart disease Father    Arthritis Father    Hearing loss Father    Hyperlipidemia Father    Heart attack Father    Hypertension Father    Stroke Father    Diabetes Sister    Depression  Sister    Diabetes Sister    Hypertension Sister    Mental illness Sister    Sleep apnea Sister    Diabetes Maternal Grandmother    Heart disease Maternal Grandmother    Depression Maternal Grandmother    Hyperlipidemia Maternal Grandmother    Hypertension Maternal Grandmother    Stroke Maternal Grandmother    Hypertension Maternal Grandfather    Hyperlipidemia Maternal Grandfather    Stroke Maternal Grandfather    Arthritis Paternal Grandmother    Hearing loss Paternal Grandmother    Stroke Paternal Grandfather    Heart disease Paternal Grandfather    Arthritis Paternal Grandfather    Heart attack Paternal Grandfather    Melanoma Other     ALLERGIES:  is allergic to pollen extract and sulfa antibiotics.  MEDICATIONS:  Current Outpatient Medications  Medication Sig Dispense Refill   acetaminophen (TYLENOL) 325 MG tablet Take 2 tablets (650 mg total) by mouth every 4 (four) hours as needed for mild pain (or temp > 37.5 C (99.5 F)).     apixaban (ELIQUIS) 2.5 MG TABS  tablet Take 1 tablet (2.5 mg total) by mouth 2 (two) times daily. 60 tablet 2   atorvastatin (LIPITOR) 40 MG tablet Take 1 tablet (40 mg total) by mouth daily at 6 PM. 90 tablet 3   busPIRone (BUSPAR) 7.5 MG tablet Take 1 tablet (7.5 mg total) by mouth 2 (two) times daily. 180 tablet 1   dapagliflozin propanediol (FARXIGA) 10 MG TABS tablet Take 1 tablet (10 mg total) by mouth daily. 30 tablet 0   dexamethasone (DECADRON) 4 MG tablet TAKE 2 TABLETS(8 MG) BY MOUTH DAILY. START THE DAY AFTER CHEMOTHERAPY FOR 2 DAYS. TAKE WITH FOOD 30 tablet 1   diclofenac Sodium (VOLTAREN) 1 % GEL Apply 4 g topically 4 (four) times daily.     hydrALAZINE (APRESOLINE) 25 MG tablet Take 1 tablet (25 mg total) by mouth every 8 (eight) hours. 90 tablet 0   ondansetron (ZOFRAN-ODT) 8 MG disintegrating tablet Take 1 tablet (8 mg total) by mouth every 8 (eight) hours as needed for nausea or vomiting. 60 tablet 1   pantoprazole (PROTONIX) 40 MG tablet Take 1 tablet (40 mg total) by mouth 2 (two) times daily before a meal. 60 tablet 3   prochlorperazine (COMPAZINE) 10 MG tablet Take 1 tablet (10 mg total) by mouth every 6 (six) hours as needed for nausea or vomiting. 60 tablet 0   sertraline (ZOLOFT) 50 MG tablet Take 1 tablet (50 mg total) by mouth daily. 30 tablet 3   torsemide (DEMADEX) 10 MG tablet Take 1 tablet (10 mg total) by mouth daily as needed (lower ext swelling). 30 tablet 0   Continuous Glucose Sensor (FREESTYLE LIBRE 2 SENSOR) MISC Place 1 sensor on the skin every 14 days. Use to check glucose continuously (Patient not taking: Reported on 02/04/2023) 2 each 11   gabapentin (NEURONTIN) 100 MG capsule Take 2 capsules (200 mg total) by mouth 2 (two) times daily. (Patient not taking: Reported on 02/04/2023) 120 capsule 0   oxyCODONE (OXY IR/ROXICODONE) 5 MG immediate release tablet Take 1 tablet (5 mg total) by mouth every 6 (six) hours as needed for severe pain (pain score 7-10). 60 tablet 0   scopolamine  (TRANSDERM-SCOP) 1 MG/3DAYS Place 1 patch (1.5 mg total) onto the skin every 3 (three) days. (Patient not taking: Reported on 02/04/2023) 4 patch 0   No current facility-administered medications for this visit.   Facility-Administered Medications Ordered in  Other Visits  Medication Dose Route Frequency Provider Last Rate Last Admin   fluorouracil (ADRUCIL) 5,550 mg in sodium chloride 0.9 % 139 mL chemo infusion  2,400 mg/m2 (Treatment Plan Recorded) Intravenous 1 day or 1 dose Rickard Patience, MD   Infusion Verify at 02/04/23 1241    Review of Systems  Constitutional:  Positive for fatigue. Negative for appetite change, chills, fever and unexpected weight change.  HENT:   Negative for hearing loss and voice change.   Eyes:  Negative for eye problems and icterus.  Respiratory:  Negative for chest tightness, cough and shortness of breath.   Cardiovascular:  Negative for chest pain and leg swelling.  Gastrointestinal:  Negative for abdominal distention, abdominal pain and nausea.  Endocrine: Negative for hot flashes.  Genitourinary:  Negative for difficulty urinating, dysuria and frequency.   Musculoskeletal:  Negative for arthralgias.  Skin:  Negative for itching and rash.  Neurological:  Positive for numbness. Negative for light-headedness.  Hematological:  Negative for adenopathy. Does not bruise/bleed easily.  Psychiatric/Behavioral:  Negative for confusion.      PHYSICAL EXAMINATION: ECOG PERFORMANCE STATUS: 2 - Symptomatic, <50% confined to bed  Vitals:   02/04/23 0841  BP: (!) 156/93  Pulse: 95  Resp: 18  Temp: (!) 97.3 F (36.3 C)   Filed Weights   02/04/23 0841  Weight: 254 lb 8 oz (115.4 kg)    Physical Exam Constitutional:      General: He is not in acute distress.    Appearance: He is obese. He is not diaphoretic.  HENT:     Head: Normocephalic and atraumatic.  Eyes:     General: No scleral icterus.    Pupils: Pupils are equal, round, and reactive to light.   Cardiovascular:     Rate and Rhythm: Normal rate.  Pulmonary:     Effort: Pulmonary effort is normal. No respiratory distress.  Abdominal:     General: There is no distension.     Palpations: Abdomen is soft.     Tenderness: There is no abdominal tenderness.  Musculoskeletal:        General: Normal range of motion.     Cervical back: Normal range of motion and neck supple.     Left lower leg: Edema present.     Comments: Left shoulder sling   Skin:    General: Skin is warm and dry.  Neurological:     Mental Status: He is alert and oriented to person, place, and time. Mental status is at baseline.     Cranial Nerves: No cranial nerve deficit.     Motor: No abnormal muscle tone.  Psychiatric:        Mood and Affect: Mood and affect normal.      LABORATORY DATA:  I have reviewed the data as listed    Latest Ref Rng & Units 02/04/2023    8:20 AM 01/21/2023    8:17 AM 01/07/2023    8:00 AM  CBC  WBC 4.0 - 10.5 K/uL 6.3  7.2  5.5   Hemoglobin 13.0 - 17.0 g/dL 16.1  09.6  04.5   Hematocrit 39.0 - 52.0 % 34.2  32.6  32.4   Platelets 150 - 400 K/uL 148  201  184       Latest Ref Rng & Units 02/04/2023    8:20 AM 01/21/2023    8:17 AM 01/07/2023    8:04 AM  CMP  Glucose 70 - 99 mg/dL 409  811  914  BUN 6 - 20 mg/dL 28  40  29   Creatinine 0.61 - 1.24 mg/dL 1.61  0.96  0.45   Sodium 135 - 145 mmol/L 137  139  137   Potassium 3.5 - 5.1 mmol/L 3.9  3.8  3.7   Chloride 98 - 111 mmol/L 107  107  108   CO2 22 - 32 mmol/L 23  24  22    Calcium 8.9 - 10.3 mg/dL 8.5  8.6  8.5   Total Protein 6.5 - 8.1 g/dL 6.9  6.9  6.9   Total Bilirubin <1.2 mg/dL 0.8  0.8  0.9   Alkaline Phos 38 - 126 U/L 113  112  95   AST 15 - 41 U/L 22  21  19    ALT 0 - 44 U/L 18  21  17       RADIOGRAPHIC STUDIES: I have personally reviewed the radiological images as listed and agreed with the findings in the report. No results found.

## 2023-02-04 NOTE — Assessment & Plan Note (Signed)
Recommend Ativan 0.5 mg every 12 hours as needed for anxiety/sleep/nausea 

## 2023-02-04 NOTE — Assessment & Plan Note (Signed)
Chemotherapy plan as listed above 

## 2023-02-05 ENCOUNTER — Ambulatory Visit: Payer: Medicaid Other | Admitting: Physical Therapy

## 2023-02-05 LAB — CEA: CEA: 3.9 ng/mL (ref 0.0–4.7)

## 2023-02-06 ENCOUNTER — Inpatient Hospital Stay: Payer: Medicaid Other

## 2023-02-06 DIAGNOSIS — M898X1 Other specified disorders of bone, shoulder: Secondary | ICD-10-CM | POA: Diagnosis not present

## 2023-02-06 DIAGNOSIS — C160 Malignant neoplasm of cardia: Secondary | ICD-10-CM

## 2023-02-06 DIAGNOSIS — Z5111 Encounter for antineoplastic chemotherapy: Secondary | ICD-10-CM | POA: Diagnosis not present

## 2023-02-06 MED ORDER — SODIUM CHLORIDE 0.9% FLUSH
10.0000 mL | INTRAVENOUS | Status: DC | PRN
Start: 2023-02-06 — End: 2023-02-06
  Administered 2023-02-06: 10 mL
  Filled 2023-02-06: qty 10

## 2023-02-06 MED ORDER — HEPARIN SOD (PORK) LOCK FLUSH 100 UNIT/ML IV SOLN
500.0000 [IU] | Freq: Once | INTRAVENOUS | Status: AC | PRN
  Administered 2023-02-06: 500 [IU]
  Filled 2023-02-06: qty 5

## 2023-02-10 ENCOUNTER — Ambulatory Visit: Payer: Medicaid Other | Admitting: Physical Therapy

## 2023-02-12 ENCOUNTER — Ambulatory Visit: Payer: Medicaid Other | Admitting: Physical Therapy

## 2023-02-17 ENCOUNTER — Ambulatory Visit: Payer: Medicaid Other | Admitting: Physical Therapy

## 2023-02-18 ENCOUNTER — Inpatient Hospital Stay: Payer: Medicaid Other

## 2023-02-18 ENCOUNTER — Encounter: Payer: Self-pay | Admitting: Oncology

## 2023-02-18 ENCOUNTER — Inpatient Hospital Stay (HOSPITAL_BASED_OUTPATIENT_CLINIC_OR_DEPARTMENT_OTHER): Payer: Medicaid Other | Admitting: Oncology

## 2023-02-18 VITALS — BP 155/94 | HR 72

## 2023-02-18 VITALS — BP 142/81 | HR 90 | Temp 97.0°F | Resp 18 | Wt 236.6 lb

## 2023-02-18 DIAGNOSIS — G620 Drug-induced polyneuropathy: Secondary | ICD-10-CM | POA: Diagnosis not present

## 2023-02-18 DIAGNOSIS — C160 Malignant neoplasm of cardia: Secondary | ICD-10-CM

## 2023-02-18 DIAGNOSIS — Z5111 Encounter for antineoplastic chemotherapy: Secondary | ICD-10-CM | POA: Diagnosis not present

## 2023-02-18 DIAGNOSIS — K7469 Other cirrhosis of liver: Secondary | ICD-10-CM

## 2023-02-18 DIAGNOSIS — T451X5A Adverse effect of antineoplastic and immunosuppressive drugs, initial encounter: Secondary | ICD-10-CM

## 2023-02-18 LAB — CMP (CANCER CENTER ONLY)
ALT: 17 U/L (ref 0–44)
AST: 22 U/L (ref 15–41)
Albumin: 3.6 g/dL (ref 3.5–5.0)
Alkaline Phosphatase: 108 U/L (ref 38–126)
Anion gap: 11 (ref 5–15)
BUN: 33 mg/dL — ABNORMAL HIGH (ref 6–20)
CO2: 24 mmol/L (ref 22–32)
Calcium: 8.8 mg/dL — ABNORMAL LOW (ref 8.9–10.3)
Chloride: 107 mmol/L (ref 98–111)
Creatinine: 1.58 mg/dL — ABNORMAL HIGH (ref 0.61–1.24)
GFR, Estimated: 52 mL/min — ABNORMAL LOW (ref >60)
Glucose, Bld: 115 mg/dL — ABNORMAL HIGH (ref 70–99)
Potassium: 3.5 mmol/L (ref 3.5–5.1)
Sodium: 142 mmol/L (ref 135–145)
Total Bilirubin: 1 mg/dL (ref <1.2)
Total Protein: 7.4 g/dL (ref 6.5–8.1)

## 2023-02-18 LAB — CBC WITH DIFFERENTIAL (CANCER CENTER ONLY)
Abs Immature Granulocytes: 0.02 10*3/uL (ref 0.00–0.07)
Basophils Absolute: 0 10*3/uL (ref 0.0–0.1)
Basophils Relative: 1 %
Eosinophils Absolute: 0.3 10*3/uL (ref 0.0–0.5)
Eosinophils Relative: 7 %
HCT: 37 % — ABNORMAL LOW (ref 39.0–52.0)
Hemoglobin: 11.7 g/dL — ABNORMAL LOW (ref 13.0–17.0)
Immature Granulocytes: 0 %
Lymphocytes Relative: 18 %
Lymphs Abs: 1 10*3/uL (ref 0.7–4.0)
MCH: 27.9 pg (ref 26.0–34.0)
MCHC: 31.6 g/dL (ref 30.0–36.0)
MCV: 88.3 fL (ref 80.0–100.0)
Monocytes Absolute: 0.7 10*3/uL (ref 0.1–1.0)
Monocytes Relative: 14 %
Neutro Abs: 3.1 10*3/uL (ref 1.7–7.7)
Neutrophils Relative %: 60 %
Platelet Count: 175 10*3/uL (ref 150–400)
RBC: 4.19 MIL/uL — ABNORMAL LOW (ref 4.22–5.81)
RDW: 19.5 % — ABNORMAL HIGH (ref 11.5–15.5)
WBC Count: 5.2 10*3/uL (ref 4.0–10.5)
nRBC: 0 % (ref 0.0–0.2)

## 2023-02-18 MED ORDER — PALONOSETRON HCL INJECTION 0.25 MG/5ML
0.2500 mg | Freq: Once | INTRAVENOUS | Status: AC
  Administered 2023-02-18: 0.25 mg via INTRAVENOUS
  Filled 2023-02-18: qty 5

## 2023-02-18 MED ORDER — FLUOROURACIL CHEMO INJECTION 5 GM/100ML
2400.0000 mg/m2 | INTRAVENOUS | Status: DC
  Administered 2023-02-18: 5550 mg via INTRAVENOUS
  Filled 2023-02-18: qty 111

## 2023-02-18 MED ORDER — DEXTROSE 5 % IV SOLN
Freq: Once | INTRAVENOUS | Status: AC
  Filled 2023-02-18: qty 250

## 2023-02-18 MED ORDER — LEUCOVORIN CALCIUM INJECTION 350 MG
400.0000 mg/m2 | Freq: Once | INTRAVENOUS | Status: AC
  Administered 2023-02-18: 928 mg via INTRAVENOUS
  Filled 2023-02-18: qty 46.4

## 2023-02-18 MED ORDER — DEXTROSE 5 % IV SOLN
65.0000 mg/m2 | Freq: Once | INTRAVENOUS | Status: AC
  Administered 2023-02-18: 150 mg via INTRAVENOUS
  Filled 2023-02-18: qty 10

## 2023-02-18 MED ORDER — DEXAMETHASONE SODIUM PHOSPHATE 10 MG/ML IJ SOLN
10.0000 mg | Freq: Once | INTRAMUSCULAR | Status: AC
  Administered 2023-02-18: 10 mg via INTRAVENOUS
  Filled 2023-02-18: qty 1

## 2023-02-18 NOTE — Assessment & Plan Note (Signed)
Chemotherapy plan as listed above 

## 2023-02-18 NOTE — Progress Notes (Signed)
Pt here for follow up. Pt has has approx 18# weight loss and believes it is fluid loss. Restarted taking fluid pill around 2 weeks ago.

## 2023-02-18 NOTE — Assessment & Plan Note (Signed)
LFT is stable.

## 2023-02-18 NOTE — Assessment & Plan Note (Signed)
 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  PD-L1 TPS<1%, CPS 3, TMB 8.4, MSI can not be accessed. PET scan Oct 2024 -good response, discussed with patient and sister.  Labs are reviewed and discussed with patient. Proceed with 5-FU +Oxaliplatin 65mg /m2

## 2023-02-18 NOTE — Progress Notes (Signed)
Hematology/Oncology Progress note [phone] [phone]    CHIEF COMPLAINTS/PURPOSE OF CONSULTATION:  GE junction cancer/gastric cardia cancer.  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  PD-L1 TPS<1%, CPS 3, TMB 8.4, MSI can not be accessed. PET scan Oct 2024 -good response, discussed with patient and sister.  Labs are reviewed and discussed with patient. Proceed with 5-FU +Oxaliplatin 65mg /m2   Other cirrhosis of liver (HCC) LFT is stable.   Encounter for antineoplastic chemotherapy Chemotherapy plan as listed above.   Chemotherapy-induced neuropathy (HCC) He declined  gabapentin 200mg  BID    Orders Placed This Encounter  Procedures   CEA    Standing Status:   Future    Standing Expiration Date:   03/31/2024   CBC with Differential (Cancer Center Only)    Standing Status:   Future    Standing Expiration Date:   03/31/2024   CMP (Cancer Center only)    Standing Status:   Future    Standing Expiration Date:   03/31/2024   CEA    Standing Status:   Future    Standing Expiration Date:   04/14/2024   CBC with Differential (Cancer Center Only)    Standing Status:   Future    Standing Expiration Date:   04/14/2024   CMP (Cancer Center only)    Standing Status:   Future    Standing Expiration Date:   04/14/2024    2 weeks lab MD FOLFOX  All questions were answered. The patient knows to call the clinic with any problems, questions or concerns.  Rickard Patience, MD, PhD [facility] Hematology Oncology 02/18/2023    HISTORY OF PRESENTING ILLNESS:  Jeremiah Vance 52 y.o. male presents to establish care for GE junction/gastric cardia adenocarcinoma I have reviewed his chart  and materials related to his cancer extensively and collaborated history with the patient. Summary of oncologic history is as follows: Oncology History  Gastric cancer (HCC)  09/08/2022 Imaging   CT abdomen pelvis w contrast     09/08/2022 Imaging   CT abdomen pelvis with contrast  Diffuse masslike thickening in the area of the GE junction and upper stomach with the extensive abnormal lymph nodes throughout the abdomen including retrocrural, porta hepatis, retroperitoneum and lesser and greater curve of the stomach. In addition there are areas of suggest peritoneal carcinomatosis. Recommend further evaluation.    No bowel obstruction. Large midline anterior pelvic wall hernia involving small bowel and mesenteric fat with rectus muscle diastasis. Gallstones.   09/19/2022 Cancer Staging   Staging form: Stomach, AJCC 8th Edition - Clinical stage from 09/19/2022: Stage IVB (cTX, cN2, cM1) - Signed by Rickard Patience, MD on 09/19/2022 Stage prefix: Initial diagnosis   09/19/2022 Initial Diagnosis   Gastric cancer (HCC)  09/08/2022, patient presented emergency room for evaluation of abdominal pain and vomiting.  He has also had weight loss.  Dysphagia with solid food for couple of months.  CT abdomen pelvis showed diffuse masslike thickening of the GE junction and upper stomach with extensive abnormal lymph nodes throughout abdomen.  09/10/2022 upper endoscopy showed malignant appearance gastric tumor at the GE junction and in the cardia Biopsied.  Nodular mucosa in the second portion of duodenum.  Biopsied. GE junction  mass showed moderate to poorly differentiated adenocarcinoma undermining squamous mucosa with focal area of squamous atypia.  Cannot exclude adenosquamous carcinoma.  Intestinal metaplasia is not identified. Duodenum cold biopsy-reactive duodenitis.  Negative for dysplasia and malignancy. Stomach random biopsy negative for H. pylori, intestinal metaplasia, dysplasia and malignancy.  Stomach  Cardia mass biopsy showed moderate to poorly differentiated adenocarcinoma.  Patient's case was discussed on tumor board on 09/19/2022.  Per Dr. Oneita Kras, nephrology findings of both GE junction mass and gastric cardia mass or similar. HER2 staining negative. PD-L1 TPS<1%, CPS 3, TMB 8.4, MSI can not be accessed.  KMT2D frame shift, GATA6 copy number gain.  Molecular testing is pending.     09/23/2022 Imaging   PET scan showed 1. 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 2. Enlarged cirrhotic liver. 3. Cholelithiasis. 4. Bilateral renal stones. 5. Umbilical hernias contain unobstructed small bowel and/or fat.  6. Aortic atherosclerosis (ICD10-I70.0). Coronary artery calcification. 7. Enlarged pulmonic trunk, indicative of pulmonary arterial hypertension.    10/02/2022 -  Chemotherapy   Patient is on Treatment Plan : GASTROESOPHAGEAL FOLFOX q14d     12/31/2022 Imaging   PET showed Significant interval response to therapy.   Residual masslike thickening at the gastric cardia, corresponding to the patient's known primary gastric carcinoma, with decreased hypermetabolic activity.   Near complete resolution of prior hypermetabolic thoracic and upper abdominal lymphadenopathy. Residual AP window and perigastric nodes.   Peritoneal disease is no longer evident.   He lives at home with his sister.  Patient is undergoing neuro physical therapy. 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 A fib previously on Eliquis, which was held due to severe iron deficiency anemia.  Restarted in August 2024 due to subacute CVA.  11/21/2022 - 11/27/2022, patient was hospitalized.  He initially presented to emergency room due to left sided numbness.  MRI of the brain showed subacute infarct in the left frontal lobe with petechial hemorrhage and mild surrounding edema.  He has no right-sided neurologic  deficits.  MRI finding was not explaining left-sided numbness.  He has chronic left sided weakness due to previous CVA.  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  Patient was discharged to rehab and was recently discharged.  S/p fall, minimally displaced distal left clavicular fracture.   INTERVAL HISTORY Jeremiah Vance is a 52 y.o. male who has above history reviewed by me today presents for follow up visit for stage IV GE junction/gastric cancer.    He takes oxycodone for pain, pain is controlled.  Denies any blood in the stool since the start of anticoagulation.  Appetite is good.  Weight is stable.  He presents for evaluation prior to chemotherapy.  MEDICAL HISTORY:  Past Medical History:  Diagnosis Date   Acute ischemic stroke (HCC) 09/06/2017   Acute renal failure superimposed on stage 3a chronic kidney disease (HCC) 07/08/2019   Acute right-sided weakness    Allergies    Anasarca    Anemia 07/10/2017   Arthritis    Cancer (HCC)    stomach and esophageal cancer stage 4   CHF (congestive heart failure) (HCC)    "coinsided w/kidney problems I was having 06/2017"   Chicken pox    CKD (chronic kidney disease) stage 3, GFR 30-59 ml/min (HCC) 06/2017   Depression    Elevated troponin 07/08/2019  High cholesterol    History of cardiomyopathy    LVEF 40 to 45% in April 2019 - subsequently normalized   Hyperbilirubinemia 07/10/2017   Hypertension    Ischemic stroke (HCC)    Small left internal capsule infarct due to lacunar disease   Morbid obesity (HCC)    Normocytic anemia 12/16/2017   Recurrent incisional hernia with incarceration s/p repair 10/22/2017 10/21/2017   Stroke (HCC) 08/2017   "right sided weakness since; getting stronger though" (10/22/2017)   Type 2 diabetes mellitus (HCC)     SURGICAL HISTORY: Past Surgical History:  Procedure Laterality Date    ABDOMINAL HERNIA REPAIR  2008; 10/22/2017   "scope; OPEN REPAIR INCARCERATED VENTRAL HERNIA   ESOPHAGOGASTRODUODENOSCOPY (EGD) WITH PROPOFOL N/A 09/10/2022   Procedure: ESOPHAGOGASTRODUODENOSCOPY (EGD) WITH PROPOFOL;  Surgeon: Toney Reil, MD;  Location: [facility] ENDOSCOPY;  Service: Gastroenterology;  Laterality: N/A;   ESOPHAGOGASTRODUODENOSCOPY (EGD) WITH PROPOFOL N/A 09/28/2022   Procedure: ESOPHAGOGASTRODUODENOSCOPY (EGD) WITH PROPOFOL;  Surgeon: Jaynie Collins, DO;  Location: [facility] ENDOSCOPY;  Service: Gastroenterology;  Laterality: N/A;   HEMOSTASIS CONTROL  09/28/2022   Procedure: HEMOSTASIS CONTROL;  Surgeon: Jaynie Collins, DO;  Location[facility] ENDOSCOPY;  Service: Gastroenterology;;   HERNIA REPAIR     KNEE ARTHROSCOPY Right 1989   PORTA CATH INSERTION N/A 09/25/2022   Procedure: PORTA CATH INSERTION;  Surgeon: Annice Needy, MD;  Location: [facility] INVASIVE CV LAB;  Service: Cardiovascular;  Laterality: N/A;   VENTRAL HERNIA REPAIR N/A 10/22/2017   Procedure: OPEN REPAIR INCARCERATED VENTRAL HERNIA;  Surgeon: Glenna Fellows, MD;  Location: [facility] OR;  Service: General;  Laterality: N/A;    SOCIAL HISTORY: Social History   Socioeconomic History   Marital status: Divorced    Spouse name: Not on file   Number of children: Not on file   Years of education: Not on file   Highest education level: Not on file  Occupational History   Not on file  Tobacco Use   Smoking status: Never   Smokeless tobacco: Never  Vaping Use   Vaping status: Never Used  Substance and Sexual Activity   Alcohol use: Yes    Comment: occasional beer   Drug use: Never   Sexual activity: Not Currently  Other Topics Concern   Not on file  Social History Narrative   He lives with his sister , [name] and she is his MPOA after his stokes   Social Determinants of Health   Financial Resource Strain: High Risk (03/20/2020)   Overall Financial Resource Strain (CARDIA)    Difficulty of  Paying Living Expenses: Hard  Food Insecurity: No Food Insecurity (11/27/2022)   Hunger Vital Sign    Worried About Running Out of Food in the Last Year: Never true    Ran Out of Food in the Last Year: Never true  Transportation Needs: No Transportation Needs (11/27/2022)   PRAPARE - Administrator, Civil Service (Medical): No    Lack of Transportation (Non-Medical): No  Recent Concern: Transportation Needs - Unmet Transportation Needs (09/27/2022)   PRAPARE - Transportation    Lack of Transportation (Medical): No    Lack of Transportation (Non-Medical): Yes  Physical Activity: Not on file  Stress: Not on file  Social Connections: Not on file  Intimate Partner Violence: Not At Risk (11/27/2022)   Humiliation, Afraid, Rape, and Kick questionnaire    Fear of Current or Ex-Partner: No    Emotionally Abused: No    Physically Abused: No  Sexually Abused: No    FAMILY HISTORY: Family History  Problem Relation Age of Onset   Diabetes Mother    Stroke Mother    Arthritis Mother    Depression Mother    Heart disease Mother    Hypertension Mother    Learning disabilities Mother    Mental illness Mother    Sleep apnea Mother    Diabetes Father    Heart disease Father    Arthritis Father    Hearing loss Father    Hyperlipidemia Father    Heart attack Father    Hypertension Father    Stroke Father    Diabetes Sister    Depression Sister    Diabetes Sister    Hypertension Sister    Mental illness Sister    Sleep apnea Sister    Diabetes Maternal Grandmother    Heart disease Maternal Grandmother    Depression Maternal Grandmother    Hyperlipidemia Maternal Grandmother    Hypertension Maternal Grandmother    Stroke Maternal Grandmother    Hypertension Maternal Grandfather    Hyperlipidemia Maternal Grandfather    Stroke Maternal Grandfather    Arthritis Paternal Grandmother    Hearing loss Paternal Grandmother    Stroke Paternal Grandfather    Heart disease  Paternal Grandfather    Arthritis Paternal Grandfather    Heart attack Paternal Grandfather    Melanoma Other     ALLERGIES:  is allergic to pollen extract and sulfa antibiotics.  MEDICATIONS:  Current Outpatient Medications  Medication Sig Dispense Refill   acetaminophen (TYLENOL) 325 MG tablet Take 2 tablets (650 mg total) by mouth every 4 (four) hours as needed for mild pain (or temp > 37.5 C (99.5 F)).     apixaban (ELIQUIS) 2.5 MG TABS tablet Take 1 tablet (2.5 mg total) by mouth 2 (two) times daily. 60 tablet 2   atorvastatin (LIPITOR) 40 MG tablet Take 1 tablet (40 mg total) by mouth daily at 6 PM. 90 tablet 3   busPIRone (BUSPAR) 7.5 MG tablet Take 1 tablet (7.5 mg total) by mouth 2 (two) times daily. 180 tablet 1   Continuous Glucose Sensor (FREESTYLE LIBRE 2 SENSOR) MISC Place 1 sensor on the skin every 14 days. Use to check glucose continuously 2 each 11   dapagliflozin propanediol (FARXIGA) 10 MG TABS tablet Take 1 tablet (10 mg total) by mouth daily. 30 tablet 0   dexamethasone (DECADRON) 4 MG tablet TAKE 2 TABLETS(8 MG) BY MOUTH DAILY. START THE DAY AFTER CHEMOTHERAPY FOR 2 DAYS. TAKE WITH FOOD 30 tablet 1   diclofenac Sodium (VOLTAREN) 1 % GEL Apply 4 g topically 4 (four) times daily. (Patient taking differently: Apply 4 g topically 4 (four) times daily as needed.)     hydrALAZINE (APRESOLINE) 25 MG tablet Take 1 tablet (25 mg total) by mouth every 8 (eight) hours. 90 tablet 0   ondansetron (ZOFRAN-ODT) 8 MG disintegrating tablet Take 1 tablet (8 mg total) by mouth every 8 (eight) hours as needed for nausea or vomiting. 60 tablet 1   oxyCODONE (OXY IR/ROXICODONE) 5 MG immediate release tablet Take 1 tablet (5 mg total) by mouth every 6 (six) hours as needed for severe pain (pain score 7-10). 60 tablet 0   pantoprazole (PROTONIX) 40 MG tablet Take 1 tablet (40 mg total) by mouth 2 (two) times daily before a meal. 60 tablet 3   prochlorperazine (COMPAZINE) 10 MG tablet Take 1  tablet (10 mg total) by mouth every 6 (six) hours  as needed for nausea or vomiting. 60 tablet 0   scopolamine (TRANSDERM-SCOP) 1 MG/3DAYS Place 1 patch (1.5 mg total) onto the skin every 3 (three) days. 4 patch 0   sertraline (ZOLOFT) 50 MG tablet Take 1 tablet (50 mg total) by mouth daily. 30 tablet 3   torsemide (DEMADEX) 10 MG tablet Take 1 tablet (10 mg total) by mouth daily as needed (lower ext swelling). 30 tablet 0   gabapentin (NEURONTIN) 100 MG capsule Take 2 capsules (200 mg total) by mouth 2 (two) times daily. (Patient not taking: Reported on 02/04/2023) 120 capsule 0   No current facility-administered medications for this visit.   Facility-Administered Medications Ordered in Other Visits  Medication Dose Route Frequency Provider Last Rate Last Admin   fluorouracil (ADRUCIL) 5,550 mg in sodium chloride 0.9 % 139 mL chemo infusion  2,400 mg/m2 (Treatment Plan Recorded) Intravenous 1 day or 1 dose Rickard Patience, MD       leucovorin 928 mg in dextrose 5 % 250 mL infusion  400 mg/m2 (Treatment Plan Recorded) Intravenous Once Rickard Patience, MD 148 mL/hr at 02/18/23 1121 928 mg at 02/18/23 1121   oxaliplatin (ELOXATIN) 150 mg in dextrose 5 % 500 mL chemo infusion  65 mg/m2 (Treatment Plan Recorded) Intravenous Once Rickard Patience, MD 265 mL/hr at 02/18/23 1118 150 mg at 02/18/23 1118    Review of Systems  Constitutional:  Positive for fatigue. Negative for appetite change, chills, fever and unexpected weight change.  HENT:   Negative for hearing loss and voice change.   Eyes:  Negative for eye problems and icterus.  Respiratory:  Negative for chest tightness, cough and shortness of breath.   Cardiovascular:  Negative for chest pain and leg swelling.  Gastrointestinal:  Negative for abdominal distention, abdominal pain and nausea.  Endocrine: Negative for hot flashes.  Genitourinary:  Negative for difficulty urinating, dysuria and frequency.   Musculoskeletal:  Negative for arthralgias.  Skin:  Negative  for itching and rash.  Neurological:  Positive for numbness. Negative for light-headedness.  Hematological:  Negative for adenopathy. Does not bruise/bleed easily.  Psychiatric/Behavioral:  Negative for confusion.      PHYSICAL EXAMINATION: ECOG PERFORMANCE STATUS: 2 - Symptomatic, <50% confined to bed  Vitals:   02/18/23 0903  BP: (!) 142/81  Pulse: 90  Resp: 18  Temp: (!) 97 F (36.1 C)  SpO2: 97%   Filed Weights   02/18/23 0903  Weight: 236 lb 9.6 oz (107.3 kg)    Physical Exam Constitutional:      General: He is not in acute distress.    Appearance: He is obese. He is not diaphoretic.  HENT:     Head: Normocephalic and atraumatic.  Eyes:     General: No scleral icterus.    Pupils: Pupils are equal, round, and reactive to light.  Cardiovascular:     Rate and Rhythm: Normal rate.  Pulmonary:     Effort: Pulmonary effort is normal. No respiratory distress.  Abdominal:     General: There is no distension.     Palpations: Abdomen is soft.     Tenderness: There is no abdominal tenderness.  Musculoskeletal:        General: Normal range of motion.     Cervical back: Normal range of motion and neck supple.     Left lower leg: Edema present.     Comments: Left shoulder sling   Skin:    General: Skin is warm and dry.  Neurological:  Mental Status: He is alert and oriented to person, place, and time. Mental status is at baseline.     Cranial Nerves: No cranial nerve deficit.     Motor: No abnormal muscle tone.  Psychiatric:        Mood and Affect: Mood and affect normal.      LABORATORY DATA:  I have reviewed the data as listed    Latest Ref Rng & Units 02/18/2023    8:28 AM 02/04/2023    8:20 AM 01/21/2023    8:17 AM  CBC  WBC 4.0 - 10.5 K/uL 5.2  6.3  7.2   Hemoglobin 13.0 - 17.0 g/dL 10.2  72.5  36.6   Hematocrit 39.0 - 52.0 % 37.0  34.2  32.6   Platelets 150 - 400 K/uL 175  148  201       Latest Ref Rng & Units 02/18/2023    8:28 AM 02/04/2023     8:20 AM 01/21/2023    8:17 AM  CMP  Glucose 70 - 99 mg/dL 440  347  425   BUN 6 - 20 mg/dL 33  28  40   Creatinine 0.61 - 1.24 mg/dL 9.56  3.87  5.64   Sodium 135 - 145 mmol/L 142  137  139   Potassium 3.5 - 5.1 mmol/L 3.5  3.9  3.8   Chloride 98 - 111 mmol/L 107  107  107   CO2 22 - 32 mmol/L 24  23  24    Calcium 8.9 - 10.3 mg/dL 8.8  8.5  8.6   Total Protein 6.5 - 8.1 g/dL 7.4  6.9  6.9   Total Bilirubin <1.2 mg/dL 1.0  0.8  0.8   Alkaline Phos 38 - 126 U/L 108  113  112   AST 15 - 41 U/L 22  22  21    ALT 0 - 44 U/L 17  18  21       RADIOGRAPHIC STUDIES: I have personally reviewed the radiological images as listed and agreed with the findings in the report. No results found.

## 2023-02-18 NOTE — Assessment & Plan Note (Signed)
He declined  gabapentin 200mg  BID

## 2023-02-18 NOTE — Patient Instructions (Signed)
[facility] - A DEPT OF [facility]  Discharge Instructions: Thank you for choosing [facility] to provide your oncology and hematology care.  If you have a lab appointment with the Cancer Center, please go directly to the Cancer Center and check in at the registration area.  Wear comfortable clothing and clothing appropriate for easy access to any Portacath or PICC line.   We strive to give you quality time with your provider. You may need to reschedule your appointment if you arrive late (15 or more minutes).  Arriving late affects you and other patients whose appointments are after yours.  Also, if you miss three or more appointments without notifying the office, you may be dismissed from the clinic at the provider's discretion.      For prescription refill requests, have your pharmacy contact our office and allow 72 hours for refills to be completed.    Today you received the following chemotherapy and/or immunotherapy agents- oxaliplatin, leucovorin, 5FU      To help prevent nausea and vomiting after your treatment, we encourage you to take your nausea medication as directed.  BELOW ARE SYMPTOMS THAT SHOULD BE REPORTED IMMEDIATELY: *FEVER GREATER THAN 100.4 F (38 C) OR HIGHER *CHILLS OR SWEATING *NAUSEA AND VOMITING THAT IS NOT CONTROLLED WITH YOUR NAUSEA MEDICATION *UNUSUAL SHORTNESS OF BREATH *UNUSUAL BRUISING OR BLEEDING *URINARY PROBLEMS (pain or burning when urinating, or frequent urination) *BOWEL PROBLEMS (unusual diarrhea, constipation, pain near the anus) TENDERNESS IN MOUTH AND THROAT WITH OR WITHOUT PRESENCE OF ULCERS (sore throat, sores in mouth, or a toothache) UNUSUAL RASH, SWELLING OR PAIN  UNUSUAL VAGINAL DISCHARGE OR ITCHING   Items with * indicate a potential emergency and should be followed up as soon as possible or go to the Emergency Department if any problems should occur.  Please show the CHEMOTHERAPY ALERT CARD  or IMMUNOTHERAPY ALERT CARD at check-in to the Emergency Department and triage nurse.  Should you have questions after your visit or need to cancel or reschedule your appointment, please contact [facility] - A DEPT OF [facility]  [phone] and follow the prompts.  Office hours are 8:00 a.m. to 4:30 p.m. Monday - Friday. Please note that voicemails left after 4:00 p.m. may not be returned until the following business day.  We are closed weekends and major holidays. You have access to a nurse at all times for urgent questions. Please call the main number to the clinic [phone] and follow the prompts.  For any non-urgent questions, you may also contact your provider using MyChart. We now offer e-Visits for anyone 63 and older to request care online for non-urgent symptoms. For details visit [email or web address].   Also download the MyChart app! Go to the app store, search "MyChart", open the app, select [place], and log in with your MyChart username and password.

## 2023-02-19 ENCOUNTER — Ambulatory Visit: Payer: Medicaid Other | Admitting: Physical Therapy

## 2023-02-19 LAB — CEA: CEA: 4.5 ng/mL (ref 0.0–4.7)

## 2023-02-20 ENCOUNTER — Inpatient Hospital Stay: Payer: Medicaid Other

## 2023-02-20 ENCOUNTER — Ambulatory Visit: Payer: Medicaid Other | Admitting: Physician Assistant

## 2023-02-20 VITALS — BP 128/81 | HR 86 | Ht 71.0 in | Wt 244.7 lb

## 2023-02-20 VITALS — BP 135/81 | HR 79 | Resp 18

## 2023-02-20 DIAGNOSIS — C160 Malignant neoplasm of cardia: Secondary | ICD-10-CM

## 2023-02-20 DIAGNOSIS — E7849 Other hyperlipidemia: Secondary | ICD-10-CM

## 2023-02-20 DIAGNOSIS — Z7984 Long term (current) use of oral hypoglycemic drugs: Secondary | ICD-10-CM

## 2023-02-20 DIAGNOSIS — F32A Depression, unspecified: Secondary | ICD-10-CM

## 2023-02-20 DIAGNOSIS — I10 Essential (primary) hypertension: Secondary | ICD-10-CM | POA: Diagnosis not present

## 2023-02-20 DIAGNOSIS — E1165 Type 2 diabetes mellitus with hyperglycemia: Secondary | ICD-10-CM

## 2023-02-20 DIAGNOSIS — F4321 Adjustment disorder with depressed mood: Secondary | ICD-10-CM

## 2023-02-20 DIAGNOSIS — Z5111 Encounter for antineoplastic chemotherapy: Secondary | ICD-10-CM | POA: Diagnosis not present

## 2023-02-20 DIAGNOSIS — F419 Anxiety disorder, unspecified: Secondary | ICD-10-CM

## 2023-02-20 LAB — POCT GLYCOSYLATED HEMOGLOBIN (HGB A1C): Hemoglobin A1C: 6.5 % — AB (ref 4.0–5.6)

## 2023-02-20 MED ORDER — HEPARIN SOD (PORK) LOCK FLUSH 100 UNIT/ML IV SOLN
500.0000 [IU] | Freq: Once | INTRAVENOUS | Status: AC | PRN
  Administered 2023-02-20: 500 [IU]
  Filled 2023-02-20: qty 5

## 2023-02-20 MED ORDER — SERTRALINE HCL 25 MG PO TABS
25.0000 mg | ORAL_TABLET | Freq: Every day | ORAL | 3 refills | Status: DC
Start: 2023-02-20 — End: 2023-05-06

## 2023-02-20 MED ORDER — FREESTYLE LIBRE 3 SENSOR MISC: 11 refills | Status: DC

## 2023-02-20 NOTE — Progress Notes (Signed)
Established patient visit  Patient: Jeremiah Vance   DOB: 1970/07/15   52 y.o. Male  MRN: [id number] Visit Date: 02/20/2023  Today's healthcare provider: Debera Lat, PA-C   Chief Complaint  Patient presents with   Medical Management of Chronic Issues    4 month DM II and hypertension follow-up. Patient reports lower leg edema bilateral at times, numbness/tingling in extremities and fatigue. Patient reports not monitoring blood pressure or sugar at home.    Hypertension   Diabetes   Subjective     Discussed the use of AI scribe software for clinical note transcription with the patient, who gave verbal consent to proceed.  History of Present Illness   The patient, with a history of hypertension, hyperlipidemia, diabetes, hearing loss, musculoskeletal issues, and stroke, is currently undergoing chemotherapy. The patient's blood pressure is reported to be stable, and he is taking medications including hydralazine, torsemide, Lipitor, Eliquis, and Zoloft. The patient's sister, a Research scientist (physical sciences), occasionally checks the patient's blood pressure at home. The patient's A1c level is reported to be 6.5, indicating relatively stable blood glucose levels. The patient also reports some anxiety, for which he is taking Zoloft. The patient's caregiver expresses concern about the patient's eating habits and requests a new prescription for Libre, a glucose monitoring system. The patient also reports some discomfort in the ankle area, but no significant swelling is observed.        02/20/2023    4:00 PM 01/01/2023    2:49 PM 12/19/2022    1:12 PM  PHQ9 SCORE ONLY  PHQ-9 Total Score 6 1 7           02/20/2023    4:00 PM 01/01/2023    2:49 PM 12/19/2022    1:12 PM  Depression screen PHQ 2/9  Decreased Interest 0 0 0  Down, Depressed, Hopeless 2 0 0  PHQ - 2 Score 2 0 0  Altered sleeping 1 0 1  Tired, decreased energy 2 0 1  Change in appetite 0 0 2  Feeling bad or failure about yourself  1  0 0  Trouble concentrating 0 0 2  Moving slowly or fidgety/restless 0 1 1  Suicidal thoughts 0 0 0  PHQ-9 Score 6 1 7   Difficult doing work/chores Very difficult        02/20/2023    4:00 PM 01/01/2023    2:49 PM 05/01/2022    1:13 PM 03/08/2020    9:33 AM  GAD 7 : Generalized Anxiety Score  Nervous, Anxious, on Edge 1 0 2 0  Control/stop worrying 1 0 2 0  Worry too much - different things 1 0 2 0  Trouble relaxing 1 0 0 0  Restless 0 0 0 0  Easily annoyed or irritable 2 1 3  0  Afraid - awful might happen 2 0 1 0  Total GAD 7 Score 8 1 10  0  Anxiety Difficulty Not difficult at all  Not difficult at all Not difficult at all    Medications: Outpatient Medications Prior to Visit  Medication Sig   acetaminophen (TYLENOL) 325 MG tablet Take 2 tablets (650 mg total) by mouth every 4 (four) hours as needed for mild pain (or temp > 37.5 C (99.5 F)).   apixaban (ELIQUIS) 2.5 MG TABS tablet Take 1 tablet (2.5 mg total) by mouth 2 (two) times daily.   atorvastatin (LIPITOR) 40 MG tablet Take 1 tablet (40 mg total) by mouth daily at 6 PM.   busPIRone (BUSPAR) 7.5 MG  tablet Take 1 tablet (7.5 mg total) by mouth 2 (two) times daily.   Continuous Glucose Sensor (FREESTYLE LIBRE 2 SENSOR) MISC Place 1 sensor on the skin every 14 days. Use to check glucose continuously   dapagliflozin propanediol (FARXIGA) 10 MG TABS tablet Take 1 tablet (10 mg total) by mouth daily.   dexamethasone (DECADRON) 4 MG tablet TAKE 2 TABLETS(8 MG) BY MOUTH DAILY. START THE DAY AFTER CHEMOTHERAPY FOR 2 DAYS. TAKE WITH FOOD   diclofenac Sodium (VOLTAREN) 1 % GEL Apply 4 g topically 4 (four) times daily. (Patient taking differently: Apply 4 g topically 4 (four) times daily as needed.)   gabapentin (NEURONTIN) 100 MG capsule Take 2 capsules (200 mg total) by mouth 2 (two) times daily. (Patient not taking: Reported on 02/04/2023)   hydrALAZINE (APRESOLINE) 25 MG tablet Take 1 tablet (25 mg total) by mouth every 8 (eight)  hours.   ondansetron (ZOFRAN-ODT) 8 MG disintegrating tablet Take 1 tablet (8 mg total) by mouth every 8 (eight) hours as needed for nausea or vomiting.   oxyCODONE (OXY IR/ROXICODONE) 5 MG immediate release tablet Take 1 tablet (5 mg total) by mouth every 6 (six) hours as needed for severe pain (pain score 7-10).   pantoprazole (PROTONIX) 40 MG tablet Take 1 tablet (40 mg total) by mouth 2 (two) times daily before a meal.   prochlorperazine (COMPAZINE) 10 MG tablet Take 1 tablet (10 mg total) by mouth every 6 (six) hours as needed for nausea or vomiting.   scopolamine (TRANSDERM-SCOP) 1 MG/3DAYS Place 1 patch (1.5 mg total) onto the skin every 3 (three) days.   sertraline (ZOLOFT) 50 MG tablet Take 1 tablet (50 mg total) by mouth daily.   torsemide (DEMADEX) 10 MG tablet Take 1 tablet (10 mg total) by mouth daily as needed (lower ext swelling).   No facility-administered medications prior to visit.    Review of Systems  Hypertension   This is a chronic problem.  Recent episode started more than a year ago.  Agents associated with hypertension include no associated agents.  Anxiety: Absent.  Blurred vision: Absent.  Chest pain: Absent.  Chest pressure/discomfort: Absent.  Dyspnea: Absent.  Headaches: Absent.  Lower extremity edema: Absent.  Orthopnea: Absent.  Palpitations: Absent.  Paroxysmal nocturnal dyspnea: Absent.  Syncope: Absent.        Diabetes   Blurred vision: Absent.  Chest pain: Absent.  Fatigue: Absent.  Foot Ulcerations: Absent.  Nausea: Absent.  Paresthesia of the feet: Present.  Polydipsia: Absent.  Polyuria: Absent.  Visual changes: Absent.  Vomiting: Absent.  Weight loss: Absent.  Episodes of hypoglycemia: Absent.       Except see HPI       Objective    BP 128/81 (BP Location: Right Arm, Patient Position: Sitting, Cuff Size: Normal)   Pulse 86   Ht 5\' 11"  (1.803 m)   Wt 244 lb 11.2 oz (111 kg)   SpO2 98%   BMI 34.13 kg/m     Physical Exam Vitals reviewed.   Constitutional:      General: He is not in acute distress.    Appearance: Normal appearance. He is not diaphoretic.  HENT:     Head: Normocephalic and atraumatic.  Eyes:     General: No scleral icterus.    Conjunctiva/sclera: Conjunctivae normal.  Cardiovascular:     Rate and Rhythm: Normal rate and regular rhythm.     Pulses: Normal pulses.     Heart sounds: Normal heart sounds. No murmur  heard. Pulmonary:     Effort: Pulmonary effort is normal. No respiratory distress.     Breath sounds: Normal breath sounds. No wheezing or rhonchi.  Musculoskeletal:     Cervical back: Neck supple.     Right lower leg: No edema.     Left lower leg: No edema.  Lymphadenopathy:     Cervical: No cervical adenopathy.  Skin:    General: Skin is warm and dry.     Findings: No rash.  Neurological:     Mental Status: He is alert and oriented to person, place, and time. Mental status is at baseline.  Psychiatric:        Mood and Affect: Mood normal.        Behavior: Behavior normal.      Results for orders placed or performed in visit on 02/20/23  POCT HgB A1C  Result Value Ref Range   Hemoglobin A1C 6.5 (A) 4.0 - 5.6 %   HbA1c POC (<> result, manual entry)     HbA1c, POC (prediabetic range)     HbA1c, POC (controlled diabetic range)      Assessment & Plan        Hypertension Chronic and stable Blood pressure controlled. No changes to current medication regimen. -Continue current medications of hydralazine 25mg  and torsemide 10mg  Advised low salt diet  Hyperlipidemia Chronic and previously stable On Lipitor. No recent lipid panel discussed. -Continue Lipitor 40mg  Continue low cholesterol diet and daily exercise  Diabetes chronic A1c 6.5, stable but not excellent control. Request for new prescription for Spiro. Truitt Merle is not covered by his current insurance. -Continue current diabetes management/Farxiga 10mg  daily -Write new prescription for  [employer].  Anxiety/Depression/Grief chronic Patient and his sister report worsening of his anxiety. Currently on Zoloft 50mg  -Increase Zoloft to 75mg  daily and continue taking Cuspar 7.5mg  BID -Reassess in 6-7 weeks.  In the setting of chemotherapy for gastric cancer, chemotherapy-induced neuropathy  General Health Maintenance -Administer flu vaccine today. -Check A1c and lipids. -Consider baby aspirin for stroke prevention, pending kidney function.     Was accompanied by aunt.  No follow-ups on file.     The patient was advised to call back or seek an in-person evaluation if the symptoms worsen or if the condition fails to improve as anticipated.  I discussed the assessment and treatment plan with the patient. The patient was provided an opportunity to ask questions and all were answered. The patient agreed with the plan and demonstrated an understanding of the instructions.  I, Debera Lat, PA-C have reviewed all documentation for this visit. The documentation on  11/212/24 for the exam, diagnosis, procedures, and orders are all accurate and complete.  Debera Lat, [facility], MMS [facility] [phone] (phone) [phone] (fax)  [facility]

## 2023-02-21 ENCOUNTER — Telehealth: Payer: Self-pay | Admitting: Physician Assistant

## 2023-02-21 ENCOUNTER — Ambulatory Visit: Payer: Medicaid Other | Admitting: Podiatry

## 2023-02-21 ENCOUNTER — Encounter: Payer: Self-pay | Admitting: Physician Assistant

## 2023-02-21 NOTE — Telephone Encounter (Signed)
Received fax from [place] [address].   Free Style Libre 3 sensors are not covered by his insurance.   Call [phone] to initiate prior author. Patient ID is  [id number]

## 2023-02-24 ENCOUNTER — Ambulatory Visit: Payer: Medicaid Other | Admitting: Physical Therapy

## 2023-02-26 ENCOUNTER — Ambulatory Visit: Payer: Medicaid Other | Admitting: Physical Therapy

## 2023-02-26 ENCOUNTER — Encounter: Payer: Medicaid Other | Admitting: Neurology

## 2023-03-03 ENCOUNTER — Ambulatory Visit: Payer: Medicaid Other | Admitting: Physical Therapy

## 2023-03-03 ENCOUNTER — Ambulatory Visit: Payer: Medicaid Other | Admitting: Physician Assistant

## 2023-03-04 ENCOUNTER — Inpatient Hospital Stay (HOSPITAL_BASED_OUTPATIENT_CLINIC_OR_DEPARTMENT_OTHER): Payer: Medicaid Other | Admitting: Oncology

## 2023-03-04 ENCOUNTER — Inpatient Hospital Stay: Payer: Medicaid Other

## 2023-03-04 ENCOUNTER — Inpatient Hospital Stay: Payer: Medicaid Other | Attending: Oncology

## 2023-03-04 ENCOUNTER — Encounter: Payer: Self-pay | Admitting: Oncology

## 2023-03-04 VITALS — BP 147/93 | HR 87 | Temp 96.5°F | Resp 18 | Wt 242.2 lb

## 2023-03-04 VITALS — BP 161/96 | HR 76 | Resp 16

## 2023-03-04 DIAGNOSIS — D500 Iron deficiency anemia secondary to blood loss (chronic): Secondary | ICD-10-CM | POA: Diagnosis not present

## 2023-03-04 DIAGNOSIS — G620 Drug-induced polyneuropathy: Secondary | ICD-10-CM | POA: Diagnosis not present

## 2023-03-04 DIAGNOSIS — R051 Acute cough: Secondary | ICD-10-CM

## 2023-03-04 DIAGNOSIS — C160 Malignant neoplasm of cardia: Secondary | ICD-10-CM | POA: Diagnosis not present

## 2023-03-04 DIAGNOSIS — Z5111 Encounter for antineoplastic chemotherapy: Secondary | ICD-10-CM | POA: Insufficient documentation

## 2023-03-04 DIAGNOSIS — K7469 Other cirrhosis of liver: Secondary | ICD-10-CM

## 2023-03-04 DIAGNOSIS — C779 Secondary and unspecified malignant neoplasm of lymph node, unspecified: Secondary | ICD-10-CM | POA: Diagnosis not present

## 2023-03-04 DIAGNOSIS — Z79899 Other long term (current) drug therapy: Secondary | ICD-10-CM | POA: Insufficient documentation

## 2023-03-04 DIAGNOSIS — T451X5A Adverse effect of antineoplastic and immunosuppressive drugs, initial encounter: Secondary | ICD-10-CM | POA: Diagnosis not present

## 2023-03-04 LAB — CMP (CANCER CENTER ONLY)
ALT: 16 U/L (ref 0–44)
AST: 24 U/L (ref 15–41)
Albumin: 3.1 g/dL — ABNORMAL LOW (ref 3.5–5.0)
Alkaline Phosphatase: 110 U/L (ref 38–126)
Anion gap: 8 (ref 5–15)
BUN: 33 mg/dL — ABNORMAL HIGH (ref 6–20)
CO2: 23 mmol/L (ref 22–32)
Calcium: 8.4 mg/dL — ABNORMAL LOW (ref 8.9–10.3)
Chloride: 110 mmol/L (ref 98–111)
Creatinine: 1.31 mg/dL — ABNORMAL HIGH (ref 0.61–1.24)
GFR, Estimated: >60 mL/min (ref >60)
Glucose, Bld: 198 mg/dL — ABNORMAL HIGH (ref 70–99)
Potassium: 4.2 mmol/L (ref 3.5–5.1)
Sodium: 141 mmol/L (ref 135–145)
Total Bilirubin: 0.6 mg/dL (ref <1.2)
Total Protein: 6.8 g/dL (ref 6.5–8.1)

## 2023-03-04 LAB — CBC WITH DIFFERENTIAL (CANCER CENTER ONLY)
Abs Immature Granulocytes: 0.03 10*3/uL (ref 0.00–0.07)
Basophils Absolute: 0 10*3/uL (ref 0.0–0.1)
Basophils Relative: 1 %
Eosinophils Absolute: 0.3 10*3/uL (ref 0.0–0.5)
Eosinophils Relative: 5 %
HCT: 35.5 % — ABNORMAL LOW (ref 39.0–52.0)
Hemoglobin: 11.1 g/dL — ABNORMAL LOW (ref 13.0–17.0)
Immature Granulocytes: 1 %
Lymphocytes Relative: 14 %
Lymphs Abs: 0.9 10*3/uL (ref 0.7–4.0)
MCH: 27.7 pg (ref 26.0–34.0)
MCHC: 31.3 g/dL (ref 30.0–36.0)
MCV: 88.5 fL (ref 80.0–100.0)
Monocytes Absolute: 0.7 10*3/uL (ref 0.1–1.0)
Monocytes Relative: 12 %
Neutro Abs: 4.1 10*3/uL (ref 1.7–7.7)
Neutrophils Relative %: 67 %
Platelet Count: 182 10*3/uL (ref 150–400)
RBC: 4.01 MIL/uL — ABNORMAL LOW (ref 4.22–5.81)
RDW: 18.1 % — ABNORMAL HIGH (ref 11.5–15.5)
WBC Count: 6.1 10*3/uL (ref 4.0–10.5)
nRBC: 0 % (ref 0.0–0.2)

## 2023-03-04 MED ORDER — DEXAMETHASONE SODIUM PHOSPHATE 10 MG/ML IJ SOLN
10.0000 mg | Freq: Once | INTRAMUSCULAR | Status: AC
Start: 2023-03-04 — End: 2023-03-04
  Administered 2023-03-04: 10 mg via INTRAVENOUS
  Filled 2023-03-04: qty 1

## 2023-03-04 MED ORDER — SODIUM CHLORIDE 0.9 % IV SOLN
2400.0000 mg/m2 | INTRAVENOUS | Status: DC
  Administered 2023-03-04: 5550 mg via INTRAVENOUS
  Filled 2023-03-04: qty 111

## 2023-03-04 MED ORDER — PALONOSETRON HCL INJECTION 0.25 MG/5ML
0.2500 mg | Freq: Once | INTRAVENOUS | Status: AC
  Administered 2023-03-04: 0.25 mg via INTRAVENOUS
  Filled 2023-03-04: qty 5

## 2023-03-04 MED ORDER — DEXTROSE 5 % IV SOLN
Freq: Once | INTRAVENOUS | Status: AC
  Filled 2023-03-04: qty 250

## 2023-03-04 MED ORDER — OXALIPLATIN CHEMO INJECTION 100 MG/20ML
65.0000 mg/m2 | Freq: Once | INTRAVENOUS | Status: AC
  Administered 2023-03-04: 150 mg via INTRAVENOUS
  Filled 2023-03-04: qty 30

## 2023-03-04 MED ORDER — LEUCOVORIN CALCIUM INJECTION 350 MG
400.0000 mg/m2 | Freq: Once | INTRAVENOUS | Status: AC
  Administered 2023-03-04: 928 mg via INTRAVENOUS
  Filled 2023-03-04: qty 46.4

## 2023-03-04 NOTE — Assessment & Plan Note (Signed)
Chemotherapy plan as listed above 

## 2023-03-04 NOTE — Assessment & Plan Note (Signed)
 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  PD-L1 TPS<1%, CPS 3, TMB 8.4, MSI can not be accessed. PET scan Oct 2024 -good response, discussed with patient and sister.  Labs are reviewed and discussed with patient. Proceed with 5-FU +Oxaliplatin 65mg /m2

## 2023-03-04 NOTE — Assessment & Plan Note (Signed)
Non productive. ? Allergy, URI.  Monitor.

## 2023-03-04 NOTE — Progress Notes (Signed)
Pt here for follow up. Pt's sister reports that patient has been coughing more.

## 2023-03-04 NOTE — Progress Notes (Signed)
Hematology/Oncology Progress note [phone] [phone]    CHIEF COMPLAINTS/PURPOSE OF CONSULTATION:  GE junction cancer/gastric cardia cancer.  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  PD-L1 TPS<1%, CPS 3, TMB 8.4, MSI can not be accessed. PET scan Oct 2024 -good response, discussed with patient and sister.  Labs are reviewed and discussed with patient. Proceed with 5-FU +Oxaliplatin 65mg /m2   Chemotherapy-induced neuropathy (HCC) He will defer gabapentin for now.   IDA (iron deficiency anemia) Lab Results  Component Value Date   HGB 11.1 (L) 03/04/2023   TIBC 287 01/07/2023   IRONPCTSAT 15 (L) 01/07/2023   FERRITIN 118 01/07/2023    Hb is stable. Close monitor.     Other cirrhosis of liver (HCC) LFT is stable.   Encounter for antineoplastic chemotherapy Chemotherapy plan as listed above.     No orders of the defined types were placed in this encounter.   2 weeks lab MD FOLFOX  All questions were answered. The patient knows to call the clinic with any problems, questions or concerns.  Rickard Patience, MD, PhD [facility] Hematology Oncology 03/04/2023    HISTORY OF PRESENTING ILLNESS:  Jeremiah Vance 52 y.o. male presents to establish care for GE junction/gastric cardia adenocarcinoma I have reviewed his chart and materials related to his cancer extensively and collaborated history with the patient. Summary of oncologic history is as follows: Oncology History  Gastric cancer (HCC)  09/08/2022 Imaging   CT abdomen pelvis w contrast     09/08/2022 Imaging   CT abdomen pelvis with contrast  Diffuse masslike thickening in the area of the GE junction and upper  stomach with the extensive abnormal lymph nodes throughout the abdomen including retrocrural, porta hepatis, retroperitoneum and lesser and greater curve of the stomach. In addition there are areas of suggest peritoneal carcinomatosis. Recommend further evaluation.    No bowel obstruction. Large midline anterior pelvic wall hernia involving small bowel and mesenteric fat with rectus muscle diastasis. Gallstones.   09/19/2022 Cancer Staging   Staging form: Stomach, AJCC 8th Edition - Clinical stage from 09/19/2022: Stage IVB (cTX, cN2, cM1) - Signed by Rickard Patience, MD on 09/19/2022 Stage prefix: Initial diagnosis   09/19/2022 Initial Diagnosis   Gastric cancer (HCC)  09/08/2022, patient presented emergency room for evaluation of abdominal pain and vomiting.  He has also had weight loss.  Dysphagia with solid food for couple of months.  CT abdomen pelvis showed diffuse masslike thickening of the GE junction and upper stomach with extensive abnormal lymph nodes throughout abdomen.  09/10/2022 upper endoscopy showed malignant appearance gastric tumor at the GE junction and in the cardia Biopsied.  Nodular mucosa in the second portion of duodenum.  Biopsied. GE junction mass showed moderate to poorly differentiated adenocarcinoma undermining squamous mucosa with focal area of squamous atypia.  Cannot exclude adenosquamous carcinoma.  Intestinal metaplasia is not identified. Duodenum cold biopsy-reactive duodenitis.  Negative for dysplasia and malignancy. Stomach random biopsy negative for H. pylori, intestinal metaplasia, dysplasia and malignancy.  Stomach Cardia mass biopsy showed moderate to poorly differentiated adenocarcinoma.  Patient's case was discussed on tumor board on 09/19/2022.  Per Dr. Oneita Kras, nephrology findings of both GE junction mass and gastric cardia mass or similar.  HER2 staining negative. PD-L1 TPS<1%, CPS 3, TMB 8.4, MSI can not be accessed.  KMT2D frame shift, GATA6 copy number  gain.  Molecular testing is pending.     09/23/2022 Imaging   PET scan showed 1. 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 2. Enlarged cirrhotic liver. 3. Cholelithiasis. 4. Bilateral renal stones. 5. Umbilical hernias contain unobstructed small bowel and/or fat.  6. Aortic atherosclerosis (ICD10-I70.0). Coronary artery calcification. 7. Enlarged pulmonic trunk, indicative of pulmonary arterial hypertension.    10/02/2022 -  Chemotherapy   Patient is on Treatment Plan : GASTROESOPHAGEAL FOLFOX q14d     12/31/2022 Imaging   PET showed Significant interval response to therapy.   Residual masslike thickening at the gastric cardia, corresponding to the patient's known primary gastric carcinoma, with decreased hypermetabolic activity.   Near complete resolution of prior hypermetabolic thoracic and upper abdominal lymphadenopathy. Residual AP window and perigastric nodes.   Peritoneal disease is no longer evident.   He lives at home with his sister.  Patient is undergoing neuro physical therapy. 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 A fib previously on Eliquis, which was held due to severe iron deficiency anemia.  Restarted in August 2024 due to subacute CVA.  11/21/2022 - 11/27/2022, patient was hospitalized.  He initially presented to emergency room due to left sided numbness.  MRI of the brain showed subacute infarct in the left frontal lobe with petechial hemorrhage and mild surrounding edema.  He has no right-sided neurologic deficits.  MRI finding was not explaining left-sided numbness.  He has chronic left sided weakness due to previous CVA.  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  Patient was discharged to rehab and was recently discharged.  S/p fall,  minimally displaced distal left clavicular fracture.   INTERVAL HISTORY Jeremiah Vance is a 51 y.o. male who has above history reviewed by me today presents for follow up visit for stage IV GE junction/gastric cancer.   Denies any blood in the stool since the start of anticoagulation.  Appetite is good.  Weight is stable.  He presents for evaluation prior to chemotherapy. + dry cough, mild SOB, not more than his baseline. No fever chills. Mother also has allergy symptoms these days.   MEDICAL HISTORY:  Past Medical History:  Diagnosis Date   Acute ischemic stroke (HCC) 09/06/2017   Acute renal failure superimposed on stage 3a chronic kidney disease (HCC) 07/08/2019   Acute right-sided weakness    Allergies    Anasarca    Anemia 07/10/2017   Arthritis    Cancer (HCC)    stomach and esophageal cancer stage 4   CHF (congestive heart failure) (HCC)    "coinsided w/kidney problems I was having 06/2017"   Chicken pox    CKD (chronic kidney disease) stage 3, GFR 30-59 ml/min (HCC) 06/2017   Depression    Elevated troponin 07/08/2019   High cholesterol    History of cardiomyopathy    LVEF 40 to 45% in April 2019 - subsequently normalized   Hyperbilirubinemia 07/10/2017   Hypertension    Ischemic stroke (HCC)    Small left internal capsule infarct due to lacunar disease   Morbid obesity (HCC)    Normocytic anemia 12/16/2017   Recurrent incisional hernia with incarceration s/p repair 10/22/2017 10/21/2017   Stroke (HCC) 08/2017   "  right sided weakness since; getting stronger though" (10/22/2017)   Type 2 diabetes mellitus [facility])     SURGICAL HISTORY: Past Surgical History:  Procedure Laterality Date   ABDOMINAL HERNIA REPAIR  2008; 10/22/2017   "scope; OPEN REPAIR INCARCERATED VENTRAL HERNIA   ESOPHAGOGASTRODUODENOSCOPY (EGD) WITH PROPOFOL N/A 09/10/2022   Procedure: ESOPHAGOGASTRODUODENOSCOPY (EGD) WITH PROPOFOL;  Surgeon: Toney Reil, MD;  Location: [facility] ENDOSCOPY;   Service: Gastroenterology;  Laterality: N/A;   ESOPHAGOGASTRODUODENOSCOPY (EGD) WITH PROPOFOL N/A 09/28/2022   Procedure: ESOPHAGOGASTRODUODENOSCOPY (EGD) WITH PROPOFOL;  Surgeon: Jaynie Collins, DO;  Location: [facility] ENDOSCOPY;  Service: Gastroenterology;  Laterality: N/A;   HEMOSTASIS CONTROL  09/28/2022   Procedure: HEMOSTASIS CONTROL;  Surgeon: Jaynie Collins, DO;  Location: [facility] ENDOSCOPY;  Service: Gastroenterology;;   HERNIA REPAIR     KNEE ARTHROSCOPY Right 1989   PORTA CATH INSERTION N/A 09/25/2022   Procedure: PORTA CATH INSERTION;  Surgeon: Annice Needy, MD;  Location: [facility] INVASIVE CV LAB;  Service: Cardiovascular;  Laterality: N/A;   VENTRAL HERNIA REPAIR N/A 10/22/2017   Procedure: OPEN REPAIR INCARCERATED VENTRAL HERNIA;  Surgeon: Glenna Fellows, MD;  Location: [facility] OR;  Service: General;  Laterality: N/A;    SOCIAL HISTORY: Social History   Socioeconomic History   Marital status: Divorced    Spouse name: Not on file   Number of children: Not on file   Years of education: Not on file   Highest education level: Not on file  Occupational History   Not on file  Tobacco Use   Smoking status: Never   Smokeless tobacco: Never  Vaping Use   Vaping status: Never Used  Substance and Sexual Activity   Alcohol use: Yes    Comment: occasional beer   Drug use: Never   Sexual activity: Not Currently  Other Topics Concern   Not on file  Social History Narrative   He lives with his sister , [name] and she is his MPOA after his stokes   Social Determinants of Health   Financial Resource Strain: High Risk (03/20/2020)   Overall Financial Resource Strain (CARDIA)    Difficulty of Paying Living Expenses: Hard  Food Insecurity: No Food Insecurity (11/27/2022)   Hunger Vital Sign    Worried About Running Out of Food in the Last Year: Never true    Ran Out of Food in the Last Year: Never true  Transportation Needs: No Transportation Needs (11/27/2022)   PRAPARE -  Administrator, Civil Service (Medical): No    Lack of Transportation (Non-Medical): No  Recent Concern: Transportation Needs - Unmet Transportation Needs (09/27/2022)   PRAPARE - Transportation    Lack of Transportation (Medical): No    Lack of Transportation (Non-Medical): Yes  Physical Activity: Not on file  Stress: Not on file  Social Connections: Not on file  Intimate Partner Violence: Not At Risk (11/27/2022)   Humiliation, Afraid, Rape, and Kick questionnaire    Fear of Current or Ex-Partner: No    Emotionally Abused: No    Physically Abused: No    Sexually Abused: No    FAMILY HISTORY: Family History  Problem Relation Age of Onset   Diabetes Mother    Stroke Mother    Arthritis Mother    Depression Mother    Heart disease Mother    Hypertension Mother    Learning disabilities Mother    Mental illness Mother    Sleep apnea Mother    Diabetes Father    Heart disease  Father    Arthritis Father    Hearing loss Father    Hyperlipidemia Father    Heart attack Father    Hypertension Father    Stroke Father    Diabetes Sister    Depression Sister    Diabetes Sister    Hypertension Sister    Mental illness Sister    Sleep apnea Sister    Diabetes Maternal Grandmother    Heart disease Maternal Grandmother    Depression Maternal Grandmother    Hyperlipidemia Maternal Grandmother    Hypertension Maternal Grandmother    Stroke Maternal Grandmother    Hypertension Maternal Grandfather    Hyperlipidemia Maternal Grandfather    Stroke Maternal Grandfather    Arthritis Paternal Grandmother    Hearing loss Paternal Grandmother    Stroke Paternal Grandfather    Heart disease Paternal Grandfather    Arthritis Paternal Grandfather    Heart attack Paternal Grandfather    Melanoma Other     ALLERGIES:  is allergic to pollen extract and sulfa antibiotics.  MEDICATIONS:  Current Outpatient Medications  Medication Sig Dispense Refill   acetaminophen (TYLENOL)  325 MG tablet Take 2 tablets (650 mg total) by mouth every 4 (four) hours as needed for mild pain (or temp > 37.5 C (99.5 F)).     apixaban (ELIQUIS) 2.5 MG TABS tablet Take 1 tablet (2.5 mg total) by mouth 2 (two) times daily. 60 tablet 2   atorvastatin (LIPITOR) 40 MG tablet Take 1 tablet (40 mg total) by mouth daily at 6 PM. 90 tablet 3   busPIRone (BUSPAR) 7.5 MG tablet Take 1 tablet (7.5 mg total) by mouth 2 (two) times daily. 180 tablet 1   Continuous Glucose Sensor (FREESTYLE LIBRE 2 SENSOR) MISC Place 1 sensor on the skin every 14 days. Use to check glucose continuously 2 each 11   Continuous Glucose Sensor (FREESTYLE LIBRE 3 SENSOR) MISC Place 1 sensor on the skin every 14 days. Use to check glucose continuously 2 each 11   dapagliflozin propanediol (FARXIGA) 10 MG TABS tablet Take 1 tablet (10 mg total) by mouth daily. 30 tablet 0   dexamethasone (DECADRON) 4 MG tablet TAKE 2 TABLETS(8 MG) BY MOUTH DAILY. START THE DAY AFTER CHEMOTHERAPY FOR 2 DAYS. TAKE WITH FOOD 30 tablet 1   diclofenac Sodium (VOLTAREN) 1 % GEL Apply 4 g topically 4 (four) times daily. (Patient taking differently: Apply 4 g topically 4 (four) times daily as needed.)     hydrALAZINE (APRESOLINE) 25 MG tablet Take 1 tablet (25 mg total) by mouth every 8 (eight) hours. 90 tablet 0   oxyCODONE (OXY IR/ROXICODONE) 5 MG immediate release tablet Take 1 tablet (5 mg total) by mouth every 6 (six) hours as needed for severe pain (pain score 7-10). 60 tablet 0   pantoprazole (PROTONIX) 40 MG tablet Take 1 tablet (40 mg total) by mouth 2 (two) times daily before a meal. 60 tablet 3   sertraline (ZOLOFT) 25 MG tablet Take 1 tablet (25 mg total) by mouth daily. 30 tablet 3   sertraline (ZOLOFT) 50 MG tablet Take 1 tablet (50 mg total) by mouth daily. 30 tablet 3   ondansetron (ZOFRAN-ODT) 8 MG disintegrating tablet Take 1 tablet (8 mg total) by mouth every 8 (eight) hours as needed for nausea or vomiting. (Patient not taking: Reported  on 03/04/2023) 60 tablet 1   prochlorperazine (COMPAZINE) 10 MG tablet Take 1 tablet (10 mg total) by mouth every 6 (six) hours as needed for  nausea or vomiting. (Patient not taking: Reported on 03/04/2023) 60 tablet 0   scopolamine (TRANSDERM-SCOP) 1 MG/3DAYS Place 1 patch (1.5 mg total) onto the skin every 3 (three) days. (Patient not taking: Reported on 03/04/2023) 4 patch 0   torsemide (DEMADEX) 10 MG tablet Take 1 tablet (10 mg total) by mouth daily as needed (lower ext swelling). (Patient not taking: Reported on 03/04/2023) 30 tablet 0   No current facility-administered medications for this visit.   Facility-Administered Medications Ordered in Other Visits  Medication Dose Route Frequency Provider Last Rate Last Admin   fluorouracil (ADRUCIL) 5,550 mg in sodium chloride 0.9 % 139 mL chemo infusion  2,400 mg/m2 (Treatment Plan Recorded) Intravenous 1 day or 1 dose Rickard Patience, MD       leucovorin 928 mg in dextrose 5 % 250 mL infusion  400 mg/m2 (Treatment Plan Recorded) Intravenous Once Rickard Patience, MD 148 mL/hr at 03/04/23 1157 928 mg at 03/04/23 1157   oxaliplatin (ELOXATIN) 150 mg in dextrose 5 % 500 mL chemo infusion  65 mg/m2 (Treatment Plan Recorded) Intravenous Once Rickard Patience, MD 265 mL/hr at 03/04/23 1155 150 mg at 03/04/23 1155    Review of Systems  Constitutional:  Positive for fatigue. Negative for appetite change, chills, fever and unexpected weight change.  HENT:   Negative for hearing loss and voice change.   Eyes:  Negative for eye problems and icterus.  Respiratory:  Positive for cough. Negative for chest tightness and shortness of breath.   Cardiovascular:  Negative for chest pain and leg swelling.  Gastrointestinal:  Negative for abdominal distention, abdominal pain and nausea.  Endocrine: Negative for hot flashes.  Genitourinary:  Negative for difficulty urinating, dysuria and frequency.   Musculoskeletal:  Negative for arthralgias.  Skin:  Negative for itching and rash.   Neurological:  Positive for numbness. Negative for light-headedness.  Hematological:  Negative for adenopathy. Does not bruise/bleed easily.  Psychiatric/Behavioral:  Negative for confusion.      PHYSICAL EXAMINATION: ECOG PERFORMANCE STATUS: 2 - Symptomatic, <50% confined to bed  Vitals:   03/04/23 0936 03/04/23 0946  BP: (!) 161/93 (!) 147/93  Pulse: 87   Resp: 18   Temp: (!) 96.5 F (35.8 C)    Filed Weights   03/04/23 0936  Weight: 242 lb 3.2 oz (109.9 kg)    Physical Exam Constitutional:      General: He is not in acute distress.    Appearance: He is obese. He is not diaphoretic.  HENT:     Head: Normocephalic and atraumatic.  Eyes:     General: No scleral icterus.    Pupils: Pupils are equal, round, and reactive to light.  Cardiovascular:     Rate and Rhythm: Normal rate.  Pulmonary:     Effort: Pulmonary effort is normal. No respiratory distress.  Abdominal:     General: There is no distension.     Palpations: Abdomen is soft.     Tenderness: There is no abdominal tenderness.  Musculoskeletal:        General: Normal range of motion.     Cervical back: Normal range of motion and neck supple.     Left lower leg: Edema present.  Skin:    General: Skin is warm and dry.  Neurological:     Mental Status: He is alert and oriented to person, place, and time. Mental status is at baseline.     Cranial Nerves: No cranial nerve deficit.     Motor: No abnormal  muscle tone.  Psychiatric:        Mood and Affect: Mood and affect normal.      LABORATORY DATA:  I have reviewed the data as listed    Latest Ref Rng & Units 03/04/2023    9:06 AM 02/18/2023    8:28 AM 02/04/2023    8:20 AM  CBC  WBC 4.0 - 10.5 K/uL 6.1  5.2  6.3   Hemoglobin 13.0 - 17.0 g/dL 16.1  09.6  04.5   Hematocrit 39.0 - 52.0 % 35.5  37.0  34.2   Platelets 150 - 400 K/uL 182  175  148       Latest Ref Rng & Units 03/04/2023    9:06 AM 02/18/2023    8:28 AM 02/04/2023    8:20 AM  CMP   Glucose 70 - 99 mg/dL 409  811  914   BUN 6 - 20 mg/dL 33  33  28   Creatinine 0.61 - 1.24 mg/dL 7.82  9.56  2.13   Sodium 135 - 145 mmol/L 141  142  137   Potassium 3.5 - 5.1 mmol/L 4.2  3.5  3.9   Chloride 98 - 111 mmol/L 110  107  107   CO2 22 - 32 mmol/L 23  24  23    Calcium 8.9 - 10.3 mg/dL 8.4  8.8  8.5   Total Protein 6.5 - 8.1 g/dL 6.8  7.4  6.9   Total Bilirubin <1.2 mg/dL 0.6  1.0  0.8   Alkaline Phos 38 - 126 U/L 110  108  113   AST 15 - 41 U/L 24  22  22    ALT 0 - 44 U/L 16  17  18       RADIOGRAPHIC STUDIES: I have personally reviewed the radiological images as listed and agreed with the findings in the report. No results found.

## 2023-03-04 NOTE — Assessment & Plan Note (Addendum)
He will defer gabapentin for now.

## 2023-03-04 NOTE — Assessment & Plan Note (Signed)
LFT is stable.

## 2023-03-04 NOTE — Assessment & Plan Note (Signed)
Lab Results  Component Value Date   HGB 11.1 (L) 03/04/2023   TIBC 287 01/07/2023   IRONPCTSAT 15 (L) 01/07/2023   FERRITIN 118 01/07/2023    Hb is stable. Close monitor.

## 2023-03-04 NOTE — Patient Instructions (Signed)
[facility] - A DEPT OF [facility]  Discharge Instructions: Thank you for choosing [facility] to provide your oncology and hematology care.  If you have a lab appointment with the Cancer Center, please go directly to the Cancer Center and check in at the registration area.  Wear comfortable clothing and clothing appropriate for easy access to any Portacath or PICC line.   We strive to give you quality time with your provider. You may need to reschedule your appointment if you arrive late (15 or more minutes).  Arriving late affects you and other patients whose appointments are after yours.  Also, if you miss three or more appointments without notifying the office, you may be dismissed from the clinic at the provider's discretion.      For prescription refill requests, have your pharmacy contact our office and allow 72 hours for refills to be completed.    Today you received the following chemotherapy and/or immunotherapy agents- oxaliplatin, leucovorin, 5FU      To help prevent nausea and vomiting after your treatment, we encourage you to take your nausea medication as directed.  BELOW ARE SYMPTOMS THAT SHOULD BE REPORTED IMMEDIATELY: *FEVER GREATER THAN 100.4 F (38 C) OR HIGHER *CHILLS OR SWEATING *NAUSEA AND VOMITING THAT IS NOT CONTROLLED WITH YOUR NAUSEA MEDICATION *UNUSUAL SHORTNESS OF BREATH *UNUSUAL BRUISING OR BLEEDING *URINARY PROBLEMS (pain or burning when urinating, or frequent urination) *BOWEL PROBLEMS (unusual diarrhea, constipation, pain near the anus) TENDERNESS IN MOUTH AND THROAT WITH OR WITHOUT PRESENCE OF ULCERS (sore throat, sores in mouth, or a toothache) UNUSUAL RASH, SWELLING OR PAIN  UNUSUAL VAGINAL DISCHARGE OR ITCHING   Items with * indicate a potential emergency and should be followed up as soon as possible or go to the Emergency Department if any problems should occur.  Please show the CHEMOTHERAPY ALERT CARD  or IMMUNOTHERAPY ALERT CARD at check-in to the Emergency Department and triage nurse.  Should you have questions after your visit or need to cancel or reschedule your appointment, please contact [facility] - A DEPT OF [facility]  [phone] and follow the prompts.  Office hours are 8:00 a.m. to 4:30 p.m. Monday - Friday. Please note that voicemails left after 4:00 p.m. may not be returned until the following business day.  We are closed weekends and major holidays. You have access to a nurse at all times for urgent questions. Please call the main number to the clinic [phone] and follow the prompts.  For any non-urgent questions, you may also contact your provider using MyChart. We now offer e-Visits for anyone 17 and older to request care online for non-urgent symptoms. For details visit [email or web address].   Also download the MyChart app! Go to the app store, search "MyChart", open the app, select [place], and log in with your MyChart username and password.

## 2023-03-05 ENCOUNTER — Ambulatory Visit: Payer: Medicaid Other | Admitting: Physical Therapy

## 2023-03-05 ENCOUNTER — Other Ambulatory Visit: Payer: Self-pay | Admitting: Family Medicine

## 2023-03-05 ENCOUNTER — Other Ambulatory Visit: Payer: Self-pay | Admitting: Physician Assistant

## 2023-03-05 DIAGNOSIS — F32A Depression, unspecified: Secondary | ICD-10-CM

## 2023-03-05 DIAGNOSIS — I10 Essential (primary) hypertension: Secondary | ICD-10-CM

## 2023-03-05 DIAGNOSIS — F4321 Adjustment disorder with depressed mood: Secondary | ICD-10-CM

## 2023-03-05 LAB — CEA: CEA: 5.1 ng/mL — ABNORMAL HIGH (ref 0.0–4.7)

## 2023-03-05 NOTE — Telephone Encounter (Signed)
Jeremiah Vance (Key: [id number]) Your demographic data has been sent to [facility]. Waiting on questions

## 2023-03-06 ENCOUNTER — Inpatient Hospital Stay: Payer: Medicaid Other

## 2023-03-06 VITALS — BP 136/81 | HR 80 | Resp 18

## 2023-03-06 DIAGNOSIS — Z5111 Encounter for antineoplastic chemotherapy: Secondary | ICD-10-CM | POA: Diagnosis not present

## 2023-03-06 DIAGNOSIS — S42032K Displaced fracture of lateral end of left clavicle, subsequent encounter for fracture with nonunion: Secondary | ICD-10-CM | POA: Diagnosis not present

## 2023-03-06 DIAGNOSIS — C160 Malignant neoplasm of cardia: Secondary | ICD-10-CM

## 2023-03-06 DIAGNOSIS — M898X1 Other specified disorders of bone, shoulder: Secondary | ICD-10-CM | POA: Diagnosis not present

## 2023-03-06 MED ORDER — HEPARIN SOD (PORK) LOCK FLUSH 100 UNIT/ML IV SOLN
500.0000 [IU] | Freq: Once | INTRAVENOUS | Status: AC | PRN
  Administered 2023-03-06: 500 [IU]
  Filled 2023-03-06: qty 5

## 2023-03-06 MED ORDER — SODIUM CHLORIDE 0.9% FLUSH
10.0000 mL | INTRAVENOUS | Status: DC | PRN
  Administered 2023-03-06: 10 mL
  Filled 2023-03-06: qty 10

## 2023-03-06 NOTE — Telephone Encounter (Signed)
Requested Prescriptions  Pending Prescriptions Disp Refills   hydrALAZINE (APRESOLINE) 25 MG tablet [Pharmacy Med Name: HYDRALAZINE  25MG  TABLETS(ORANGE)] 90 tablet 1    Sig: TAKE 1 TABLET(25 MG) BY MOUTH EVERY 8 HOURS     Cardiovascular:  Vasodilators Failed - 03/05/2023  3:35 AM      Failed - HCT in normal range and within 360 days    HCT  Date Value Ref Range Status  03/04/2023 35.5 (L) 39.0 - 52.0 % Final   Hematocrit  Date Value Ref Range Status  05/23/2022 35.8 (L) 37.5 - 51.0 % Final         Failed - HGB in normal range and within 360 days    Hemoglobin  Date Value Ref Range Status  03/04/2023 11.1 (L) 13.0 - 17.0 g/dL Final  25/36/6440 34.7 (L) 13.0 - 17.7 g/dL Final         Failed - RBC in normal range and within 360 days    RBC  Date Value Ref Range Status  03/04/2023 4.01 (L) 4.22 - 5.81 MIL/uL Final         Failed - ANA Screen, Ifa, Serum in normal range and within 360 days    Anti Nuclear Antibody(ANA)  Date Value Ref Range Status  07/11/2017 Negative Negative Final    Comment:    (NOTE) Performed At: [facility] [address] [place], [place] [id number] Jolene Schimke MD FI:[id number] Performed at [facility], [address] [place], [place] [address]          Failed - Last BP in normal range    BP Readings from Last 1 Encounters:  03/06/23 136/81         Passed - WBC in normal range and within 360 days    WBC  Date Value Ref Range Status  12/02/2022 5.8 4.0 - 10.5 K/uL Final   WBC Count  Date Value Ref Range Status  03/04/2023 6.1 4.0 - 10.5 K/uL Final         Passed - PLT in normal range and within 360 days    Platelets  Date Value Ref Range Status  05/23/2022 421 150 - 450 x10E3/uL Final   Platelet Count  Date Value Ref Range Status  03/04/2023 182 150 - 400 K/uL Final         Passed - Valid encounter within last 12 months    Recent Outpatient Visits           2 weeks ago Type 2 diabetes mellitus with hyperglycemia,  without long-term current use of insulin (HCC)   [place] [place], [place], PA-C   2 months ago Hospital discharge follow-up   [facility] [place], [place], DO   4 months ago AKI (acute kidney injury) [facility])   [facility] Merita Norton T, FNP   7 months ago Morbid obesity [facility])    [facility] [place], [place], PA-C   9 months ago Hypotension due to drugs   [facility] [place], [place], [place]

## 2023-03-06 NOTE — Telephone Encounter (Signed)
Sent to plan.

## 2023-03-10 ENCOUNTER — Ambulatory Visit: Payer: Medicaid Other | Admitting: Physical Therapy

## 2023-03-11 ENCOUNTER — Ambulatory Visit: Payer: Medicaid Other | Admitting: Podiatry

## 2023-03-12 ENCOUNTER — Ambulatory Visit: Payer: Medicaid Other | Admitting: Physical Therapy

## 2023-03-13 ENCOUNTER — Telehealth: Payer: Self-pay

## 2023-03-13 ENCOUNTER — Other Ambulatory Visit: Payer: Self-pay

## 2023-03-13 NOTE — Telephone Encounter (Signed)
Free Style Jeremiah Vance has been denied, [employer] requires patient be insulin dependent diabetic

## 2023-03-14 ENCOUNTER — Encounter: Payer: Self-pay | Admitting: Oncology

## 2023-03-14 MED ORDER — PANTOPRAZOLE SODIUM 40 MG PO TBEC: 40.0000 mg | DELAYED_RELEASE_TABLET | Freq: Two times a day (BID) | ORAL | 3 refills | Status: DC

## 2023-03-17 ENCOUNTER — Ambulatory Visit: Payer: Medicaid Other | Admitting: Physical Therapy

## 2023-03-18 ENCOUNTER — Inpatient Hospital Stay (HOSPITAL_BASED_OUTPATIENT_CLINIC_OR_DEPARTMENT_OTHER): Payer: Medicaid Other | Admitting: Oncology

## 2023-03-18 ENCOUNTER — Encounter: Payer: Self-pay | Admitting: Oncology

## 2023-03-18 ENCOUNTER — Inpatient Hospital Stay: Payer: Medicaid Other

## 2023-03-18 VITALS — HR 97

## 2023-03-18 VITALS — BP 136/95 | HR 106 | Temp 96.3°F | Resp 18 | Wt 242.0 lb

## 2023-03-18 DIAGNOSIS — K7469 Other cirrhosis of liver: Secondary | ICD-10-CM | POA: Diagnosis not present

## 2023-03-18 DIAGNOSIS — G620 Drug-induced polyneuropathy: Secondary | ICD-10-CM | POA: Diagnosis not present

## 2023-03-18 DIAGNOSIS — C160 Malignant neoplasm of cardia: Secondary | ICD-10-CM | POA: Diagnosis not present

## 2023-03-18 DIAGNOSIS — T451X5A Adverse effect of antineoplastic and immunosuppressive drugs, initial encounter: Secondary | ICD-10-CM

## 2023-03-18 DIAGNOSIS — Z5111 Encounter for antineoplastic chemotherapy: Secondary | ICD-10-CM

## 2023-03-18 DIAGNOSIS — I480 Paroxysmal atrial fibrillation: Secondary | ICD-10-CM | POA: Diagnosis not present

## 2023-03-18 LAB — CMP (CANCER CENTER ONLY)
ALT: 19 U/L (ref 0–44)
AST: 28 U/L (ref 15–41)
Albumin: 3.2 g/dL — ABNORMAL LOW (ref 3.5–5.0)
Alkaline Phosphatase: 107 U/L (ref 38–126)
Anion gap: 9 (ref 5–15)
BUN: 28 mg/dL — ABNORMAL HIGH (ref 6–20)
CO2: 22 mmol/L (ref 22–32)
Calcium: 8.5 mg/dL — ABNORMAL LOW (ref 8.9–10.3)
Chloride: 108 mmol/L (ref 98–111)
Creatinine: 1.41 mg/dL — ABNORMAL HIGH (ref 0.61–1.24)
GFR, Estimated: 60 mL/min — ABNORMAL LOW (ref >60)
Glucose, Bld: 140 mg/dL — ABNORMAL HIGH (ref 70–99)
Potassium: 3.8 mmol/L (ref 3.5–5.1)
Sodium: 139 mmol/L (ref 135–145)
Total Bilirubin: 0.7 mg/dL (ref <1.2)
Total Protein: 6.9 g/dL (ref 6.5–8.1)

## 2023-03-18 LAB — SAMPLE TO BLOOD BANK

## 2023-03-18 LAB — CBC WITH DIFFERENTIAL (CANCER CENTER ONLY)
Abs Immature Granulocytes: 0.03 10*3/uL (ref 0.00–0.07)
Basophils Absolute: 0 10*3/uL (ref 0.0–0.1)
Basophils Relative: 1 %
Eosinophils Absolute: 0.3 10*3/uL (ref 0.0–0.5)
Eosinophils Relative: 5 %
HCT: 35.8 % — ABNORMAL LOW (ref 39.0–52.0)
Hemoglobin: 11.4 g/dL — ABNORMAL LOW (ref 13.0–17.0)
Immature Granulocytes: 1 %
Lymphocytes Relative: 14 %
Lymphs Abs: 0.8 10*3/uL (ref 0.7–4.0)
MCH: 28 pg (ref 26.0–34.0)
MCHC: 31.8 g/dL (ref 30.0–36.0)
MCV: 88 fL (ref 80.0–100.0)
Monocytes Absolute: 0.8 10*3/uL (ref 0.1–1.0)
Monocytes Relative: 14 %
Neutro Abs: 3.8 10*3/uL (ref 1.7–7.7)
Neutrophils Relative %: 65 %
Platelet Count: 160 10*3/uL (ref 150–400)
RBC: 4.07 MIL/uL — ABNORMAL LOW (ref 4.22–5.81)
RDW: 17.8 % — ABNORMAL HIGH (ref 11.5–15.5)
WBC Count: 5.7 10*3/uL (ref 4.0–10.5)
nRBC: 0 % (ref 0.0–0.2)

## 2023-03-18 MED ORDER — DEXTROSE 5 % IV SOLN
Freq: Once | INTRAVENOUS | Status: AC
  Filled 2023-03-18: qty 250

## 2023-03-18 MED ORDER — PALONOSETRON HCL INJECTION 0.25 MG/5ML
0.2500 mg | Freq: Once | INTRAVENOUS | Status: AC
  Administered 2023-03-18: 0.25 mg via INTRAVENOUS
  Filled 2023-03-18: qty 5

## 2023-03-18 MED ORDER — DEXTROSE 5 % IV SOLN
400.0000 mg/m2 | Freq: Once | INTRAVENOUS | Status: AC
  Administered 2023-03-18: 928 mg via INTRAVENOUS
  Filled 2023-03-18: qty 46.4

## 2023-03-18 MED ORDER — SODIUM CHLORIDE 0.9 % IV SOLN
2400.0000 mg/m2 | INTRAVENOUS | Status: DC
  Administered 2023-03-18: 5550 mg via INTRAVENOUS
  Filled 2023-03-18: qty 111

## 2023-03-18 MED ORDER — OXALIPLATIN CHEMO INJECTION 100 MG/20ML
65.0000 mg/m2 | Freq: Once | INTRAVENOUS | Status: AC
  Administered 2023-03-18: 150 mg via INTRAVENOUS
  Filled 2023-03-18: qty 10

## 2023-03-18 MED ORDER — DEXAMETHASONE SODIUM PHOSPHATE 10 MG/ML IJ SOLN
10.0000 mg | Freq: Once | INTRAMUSCULAR | Status: AC
  Administered 2023-03-18: 10 mg via INTRAVENOUS
  Filled 2023-03-18: qty 1

## 2023-03-18 NOTE — Progress Notes (Signed)
Hematology/Oncology Progress note [phone] [phone]    CHIEF COMPLAINTS/PURPOSE OF CONSULTATION:  GE junction cancer/gastric cardia cancer.  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  PD-L1 TPS<1%, CPS 3, TMB 8.4, MSI can not be accessed. PET scan Oct 2024 -good response, discussed with patient and sister.  Labs are reviewed and discussed with patient. Proceed with cycle 12 5-FU +Oxaliplatin 65mg /m2  Plan to switch to 5-FU maintenance afterwards.   Chemotherapy-induced neuropathy (HCC) Stable symptoms. He will defer gabapentin for now.   Encounter for antineoplastic chemotherapy Chemotherapy plan as listed above.   Other cirrhosis of liver (HCC) LFT is stable. monitor  Paroxysmal atrial fibrillation (HCC) Continue Eliquis 2.5mg  BID    No orders of the defined types were placed in this encounter.   2 weeks lab MD5 FU All questions were answered. The patient knows to call the clinic with any problems, questions or concerns.  Rickard Patience, MD, PhD [facility] Hematology Oncology 03/18/2023    HISTORY OF PRESENTING ILLNESS:  Jeremiah Vance 52 y.o. male presents to establish care for GE junction/gastric cardia adenocarcinoma I have reviewed his chart and materials related to his cancer extensively and collaborated history with the patient. Summary of oncologic history is as follows: Oncology History  Gastric cancer (HCC)  09/08/2022 Imaging   CT abdomen pelvis w contrast     09/08/2022 Imaging   CT abdomen pelvis with contrast  Diffuse masslike thickening in the area of the GE junction and upper stomach with the extensive abnormal lymph nodes throughout the abdomen  including retrocrural, porta hepatis, retroperitoneum and lesser and greater curve of the stomach. In addition there are areas of suggest peritoneal carcinomatosis. Recommend further evaluation.    No bowel obstruction. Large midline anterior pelvic wall hernia involving small bowel and mesenteric fat with rectus muscle diastasis. Gallstones.   09/19/2022 Cancer Staging   Staging form: Stomach, AJCC 8th Edition - Clinical stage from 09/19/2022: Stage IVB (cTX, cN2, cM1) - Signed by Rickard Patience, MD on 09/19/2022 Stage prefix: Initial diagnosis   09/19/2022 Initial Diagnosis   Gastric cancer (HCC)  09/08/2022, patient presented emergency room for evaluation of abdominal pain and vomiting.  He has also had weight loss.  Dysphagia with solid food for couple of months.  CT abdomen pelvis showed diffuse masslike thickening of the GE junction and upper stomach with extensive abnormal lymph nodes throughout abdomen.  09/10/2022 upper endoscopy showed malignant appearance gastric tumor at the GE junction and in the cardia Biopsied.  Nodular mucosa in the second portion of duodenum.  Biopsied. GE junction mass showed moderate to poorly differentiated adenocarcinoma undermining squamous mucosa with focal area of squamous atypia.  Cannot exclude adenosquamous carcinoma.  Intestinal metaplasia is not identified. Duodenum cold biopsy-reactive duodenitis.  Negative for dysplasia and malignancy. Stomach random biopsy negative for H. pylori, intestinal metaplasia, dysplasia and malignancy.  Stomach Cardia mass biopsy showed moderate to poorly differentiated adenocarcinoma.  Patient's case was discussed on tumor board on 09/19/2022.  Per Dr. Oneita Kras, nephrology findings of both GE junction mass and gastric cardia mass or similar. HER2 staining negative. PD-L1 TPS<1%, CPS 3, TMB 8.4, MSI can not be accessed.  KMT2D frame shift, GATA6 copy number gain.  Molecular  testing is pending.     09/23/2022 Imaging   PET scan  showed 1. 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 2. Enlarged cirrhotic liver. 3. Cholelithiasis. 4. Bilateral renal stones. 5. Umbilical hernias contain unobstructed small bowel and/or fat.  6. Aortic atherosclerosis (ICD10-I70.0). Coronary artery calcification. 7. Enlarged pulmonic trunk, indicative of pulmonary arterial hypertension.    10/02/2022 -  Chemotherapy   Patient is on Treatment Plan : GASTROESOPHAGEAL FOLFOX q14d     12/31/2022 Imaging   PET showed Significant interval response to therapy.   Residual masslike thickening at the gastric cardia, corresponding to the patient's known primary gastric carcinoma, with decreased hypermetabolic activity.   Near complete resolution of prior hypermetabolic thoracic and upper abdominal lymphadenopathy. Residual AP window and perigastric nodes.   Peritoneal disease is no longer evident.   He lives at home with his sister.  Patient is undergoing neuro physical therapy. 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 A fib previously on Eliquis, which was held due to severe iron deficiency anemia.  Restarted in August 2024 due to subacute CVA.  11/21/2022 - 11/27/2022, patient was hospitalized.  He initially presented to emergency room due to left sided numbness.  MRI of the brain showed subacute infarct in the left frontal lobe with petechial hemorrhage and mild surrounding edema.  He has no right-sided neurologic deficits.  MRI finding was not explaining left-sided numbness.  He has chronic left sided weakness due to previous CVA.  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  Patient was discharged to rehab and was recently discharged.  S/p fall, minimally displaced distal left clavicular fracture.   INTERVAL  HISTORY Jeremiah Vance is a 52 y.o. male who has above history reviewed by me today presents for follow up visit for stage IV GE junction/gastric cancer.   Denies any blood in the stool since the start of anticoagulation.  Appetite is good.  Weight is stable.  He presents for evaluation prior to chemotherapy. + dry cough, mild SOB, not more than his baseline. No fever chills. Mother also has allergy symptoms these days.   MEDICAL HISTORY:  Past Medical History:  Diagnosis Date   Acute ischemic stroke (HCC) 09/06/2017   Acute renal failure superimposed on stage 3a chronic kidney disease (HCC) 07/08/2019   Acute right-sided weakness    Allergies    Anasarca    Anemia 07/10/2017   Arthritis    Cancer (HCC)    stomach and esophageal cancer stage 4   CHF (congestive heart failure) (HCC)    "coinsided w/kidney problems I was having 06/2017"   Chicken pox    CKD (chronic kidney disease) stage 3, GFR 30-59 ml/min (HCC) 06/2017   Depression    Elevated troponin 07/08/2019   High cholesterol    History of cardiomyopathy    LVEF 40 to 45% in April 2019 - subsequently normalized   Hyperbilirubinemia 07/10/2017   Hypertension    Ischemic stroke (HCC)    Small left internal capsule infarct due to lacunar disease   Morbid obesity (HCC)    Normocytic anemia 12/16/2017   Recurrent incisional hernia with incarceration s/p repair 10/22/2017 10/21/2017   Stroke (HCC) 08/2017   "right sided weakness since; getting stronger though" (10/22/2017)   Type 2 diabetes mellitus (HCC)     SURGICAL HISTORY: Past Surgical History:  Procedure Laterality Date   ABDOMINAL HERNIA REPAIR  2008; 10/22/2017   "scope; OPEN REPAIR INCARCERATED VENTRAL HERNIA   ESOPHAGOGASTRODUODENOSCOPY (EGD) WITH PROPOFOL N/A 09/10/2022   Procedure: ESOPHAGOGASTRODUODENOSCOPY (EGD) WITH PROPOFOL;  Surgeon: Toney Reil, MD;  Location[facility] ENDOSCOPY;  Service: Gastroenterology;  Laterality: N/A;    ESOPHAGOGASTRODUODENOSCOPY (EGD) WITH PROPOFOL N/A 09/28/2022   Procedure: ESOPHAGOGASTRODUODENOSCOPY (EGD) WITH PROPOFOL;  Surgeon: Jaynie Collins, DO;  Location: [facility] ENDOSCOPY;  Service: Gastroenterology;  Laterality: N/A;   HEMOSTASIS CONTROL  09/28/2022   Procedure: HEMOSTASIS CONTROL;  Surgeon: Jaynie Collins, DO;  Location: [facility] ENDOSCOPY;  Service: Gastroenterology;;   HERNIA REPAIR     KNEE ARTHROSCOPY Right 1989   PORTA CATH INSERTION N/A 09/25/2022   Procedure: PORTA CATH INSERTION;  Surgeon: Annice Needy, MD;  Location: [facility] INVASIVE CV LAB;  Service: Cardiovascular;  Laterality: N/A;   VENTRAL HERNIA REPAIR N/A 10/22/2017   Procedure: OPEN REPAIR INCARCERATED VENTRAL HERNIA;  Surgeon: Glenna Fellows, MD;  Location: [facility] OR;  Service: General;  Laterality: N/A;    SOCIAL HISTORY: Social History   Socioeconomic History   Marital status: Divorced    Spouse name: Not on file   Number of children: Not on file   Years of education: Not on file   Highest education level: Not on file  Occupational History   Not on file  Tobacco Use   Smoking status: Never   Smokeless tobacco: Never  Vaping Use   Vaping status: Never Used  Substance and Sexual Activity   Alcohol use: Yes    Comment: occasional beer   Drug use: Never   Sexual activity: Not Currently  Other Topics Concern   Not on file  Social History Narrative   He lives with his sister , [name] and she is his MPOA after his stokes   Social Drivers of Corporate investment banker Strain: High Risk (03/20/2020)   Overall Financial Resource Strain (CARDIA)    Difficulty of Paying Living Expenses: Hard  Food Insecurity: No Food Insecurity (11/27/2022)   Hunger Vital Sign    Worried About Running Out of Food in the Last Year: Never true    Ran Out of Food in the Last Year: Never true  Transportation Needs: No Transportation Needs (11/27/2022)   PRAPARE - Administrator, Civil Service (Medical):  No    Lack of Transportation (Non-Medical): No  Recent Concern: Transportation Needs - Unmet Transportation Needs (09/27/2022)   PRAPARE - Administrator, Civil Service (Medical): No    Lack of Transportation (Non-Medical): Yes  Physical Activity: Not on file  Stress: Not on file  Social Connections: Not on file  Intimate Partner Violence: Not At Risk (11/27/2022)   Humiliation, Afraid, Rape, and Kick questionnaire    Fear of Current or Ex-Partner: No    Emotionally Abused: No    Physically Abused: No    Sexually Abused: No    FAMILY HISTORY: Family History  Problem Relation Age of Onset   Diabetes Mother    Stroke Mother    Arthritis Mother    Depression Mother    Heart disease Mother    Hypertension Mother    Learning disabilities Mother    Mental illness Mother    Sleep apnea Mother    Diabetes Father    Heart disease Father    Arthritis Father    Hearing loss Father    Hyperlipidemia Father    Heart attack Father  Hypertension Father    Stroke Father    Diabetes Sister    Depression Sister    Diabetes Sister    Hypertension Sister    Mental illness Sister    Sleep apnea Sister    Diabetes Maternal Grandmother    Heart disease Maternal Grandmother    Depression Maternal Grandmother    Hyperlipidemia Maternal Grandmother    Hypertension Maternal Grandmother    Stroke Maternal Grandmother    Hypertension Maternal Grandfather    Hyperlipidemia Maternal Grandfather    Stroke Maternal Grandfather    Arthritis Paternal Grandmother    Hearing loss Paternal Grandmother    Stroke Paternal Grandfather    Heart disease Paternal Grandfather    Arthritis Paternal Grandfather    Heart attack Paternal Grandfather    Melanoma Other     ALLERGIES:  is allergic to pollen extract and sulfa antibiotics.  MEDICATIONS:  Current Outpatient Medications  Medication Sig Dispense Refill   acetaminophen (TYLENOL) 325 MG tablet Take 2 tablets (650 mg total) by mouth  every 4 (four) hours as needed for mild pain (or temp > 37.5 C (99.5 F)).     apixaban (ELIQUIS) 2.5 MG TABS tablet Take 1 tablet (2.5 mg total) by mouth 2 (two) times daily. 60 tablet 2   atorvastatin (LIPITOR) 40 MG tablet Take 1 tablet (40 mg total) by mouth daily at 6 PM. 90 tablet 3   busPIRone (BUSPAR) 7.5 MG tablet Take 1 tablet (7.5 mg total) by mouth 2 (two) times daily. 180 tablet 1   Continuous Glucose Sensor (FREESTYLE LIBRE 2 SENSOR) MISC Place 1 sensor on the skin every 14 days. Use to check glucose continuously 2 each 11   Continuous Glucose Sensor (FREESTYLE LIBRE 3 SENSOR) MISC Place 1 sensor on the skin every 14 days. Use to check glucose continuously 2 each 11   dapagliflozin propanediol (FARXIGA) 10 MG TABS tablet Take 1 tablet (10 mg total) by mouth daily. 30 tablet 0   dexamethasone (DECADRON) 4 MG tablet TAKE 2 TABLETS(8 MG) BY MOUTH DAILY. START THE DAY AFTER CHEMOTHERAPY FOR 2 DAYS. TAKE WITH FOOD 30 tablet 1   diclofenac Sodium (VOLTAREN) 1 % GEL Apply 4 g topically 4 (four) times daily. (Patient taking differently: Apply 4 g topically 4 (four) times daily as needed.)     hydrALAZINE (APRESOLINE) 25 MG tablet TAKE 1 TABLET(25 MG) BY MOUTH EVERY 8 HOURS 90 tablet 1   oxyCODONE (OXY IR/ROXICODONE) 5 MG immediate release tablet Take 1 tablet (5 mg total) by mouth every 6 (six) hours as needed for severe pain (pain score 7-10). 60 tablet 0   pantoprazole (PROTONIX) 40 MG tablet Take 1 tablet (40 mg total) by mouth 2 (two) times daily before a meal. 60 tablet 3   scopolamine (TRANSDERM-SCOP) 1 MG/3DAYS Place 1 patch (1.5 mg total) onto the skin every 3 (three) days. 4 patch 0   sertraline (ZOLOFT) 25 MG tablet Take 1 tablet (25 mg total) by mouth daily. 30 tablet 3   sertraline (ZOLOFT) 50 MG tablet Take 1 tablet (50 mg total) by mouth daily. 30 tablet 3   ondansetron (ZOFRAN-ODT) 8 MG disintegrating tablet Take 1 tablet (8 mg total) by mouth every 8 (eight) hours as needed for  nausea or vomiting. (Patient not taking: Reported on 03/18/2023) 60 tablet 1   prochlorperazine (COMPAZINE) 10 MG tablet Take 1 tablet (10 mg total) by mouth every 6 (six) hours as needed for nausea or vomiting. (Patient not taking: Reported  on 03/04/2023) 60 tablet 0   torsemide (DEMADEX) 10 MG tablet Take 1 tablet (10 mg total) by mouth daily as needed (lower ext swelling). (Patient not taking: Reported on 03/18/2023) 30 tablet 0   No current facility-administered medications for this visit.    Review of Systems  Constitutional:  Positive for fatigue. Negative for appetite change, chills, fever and unexpected weight change.  HENT:   Negative for hearing loss and voice change.   Eyes:  Negative for eye problems and icterus.  Respiratory:  Positive for cough. Negative for chest tightness and shortness of breath.   Cardiovascular:  Negative for chest pain and leg swelling.  Gastrointestinal:  Negative for abdominal distention, abdominal pain and nausea.  Endocrine: Negative for hot flashes.  Genitourinary:  Negative for difficulty urinating, dysuria and frequency.   Musculoskeletal:  Negative for arthralgias.  Skin:  Negative for itching and rash.  Neurological:  Positive for numbness. Negative for light-headedness.  Hematological:  Negative for adenopathy. Does not bruise/bleed easily.  Psychiatric/Behavioral:  Negative for confusion.      PHYSICAL EXAMINATION: ECOG PERFORMANCE STATUS: 2 - Symptomatic, <50% confined to bed  Vitals:   03/18/23 0850 03/18/23 0859  BP: (!) 160/95 (!) 136/95  Pulse: (!) 106   Resp: 18   Temp: (!) 96.3 F (35.7 C)   SpO2: 98%    Filed Weights   03/18/23 0850  Weight: 242 lb (109.8 kg)    Physical Exam Constitutional:      General: He is not in acute distress.    Appearance: He is obese. He is not diaphoretic.  HENT:     Head: Normocephalic and atraumatic.  Eyes:     General: No scleral icterus.    Pupils: Pupils are equal, round, and  reactive to light.  Cardiovascular:     Rate and Rhythm: Normal rate.  Pulmonary:     Effort: Pulmonary effort is normal. No respiratory distress.  Abdominal:     General: There is no distension.     Palpations: Abdomen is soft.     Tenderness: There is no abdominal tenderness.  Musculoskeletal:        General: Normal range of motion.     Cervical back: Normal range of motion and neck supple.     Left lower leg: Edema present.  Skin:    General: Skin is warm and dry.  Neurological:     Mental Status: He is alert and oriented to person, place, and time. Mental status is at baseline.     Cranial Nerves: No cranial nerve deficit.     Motor: No abnormal muscle tone.  Psychiatric:        Mood and Affect: Mood and affect normal.      LABORATORY DATA:  I have reviewed the data as listed    Latest Ref Rng & Units 03/18/2023    8:39 AM 03/04/2023    9:06 AM 02/18/2023    8:28 AM  CBC  WBC 4.0 - 10.5 K/uL 5.7  6.1  5.2   Hemoglobin 13.0 - 17.0 g/dL 91.4  78.2  95.6   Hematocrit 39.0 - 52.0 % 35.8  35.5  37.0   Platelets 150 - 400 K/uL 160  182  175       Latest Ref Rng & Units 03/18/2023    8:39 AM 03/04/2023    9:06 AM 02/18/2023    8:28 AM  CMP  Glucose 70 - 99 mg/dL 213  086  578  BUN 6 - 20 mg/dL 28  33  33   Creatinine 0.61 - 1.24 mg/dL 6.64  4.03  4.74   Sodium 135 - 145 mmol/L 139  141  142   Potassium 3.5 - 5.1 mmol/L 3.8  4.2  3.5   Chloride 98 - 111 mmol/L 108  110  107   CO2 22 - 32 mmol/L 22  23  24    Calcium 8.9 - 10.3 mg/dL 8.5  8.4  8.8   Total Protein 6.5 - 8.1 g/dL 6.9  6.8  7.4   Total Bilirubin <1.2 mg/dL 0.7  0.6  1.0   Alkaline Phos 38 - 126 U/L 107  110  108   AST 15 - 41 U/L 28  24  22    ALT 0 - 44 U/L 19  16  17       RADIOGRAPHIC STUDIES: I have personally reviewed the radiological images as listed and agreed with the findings in the report. No results found.

## 2023-03-18 NOTE — Assessment & Plan Note (Addendum)
Stable symptoms. He will defer gabapentin for now.

## 2023-03-18 NOTE — Assessment & Plan Note (Addendum)
LFT is stable. monitor

## 2023-03-18 NOTE — Assessment & Plan Note (Signed)
Chemotherapy plan as listed above 

## 2023-03-18 NOTE — Assessment & Plan Note (Signed)
Continue Eliquis 2.5mg BID.  

## 2023-03-18 NOTE — Assessment & Plan Note (Addendum)
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  PD-L1 TPS<1%, CPS 3, TMB 8.4, MSI can not be accessed. PET scan Oct 2024 -good response, discussed with patient and sister.  Labs are reviewed and discussed with patient. Proceed with cycle 12 5-FU +Oxaliplatin 65mg /m2  Plan to switch to 5-FU maintenance afterwards.

## 2023-03-19 LAB — CEA: CEA: 5.8 ng/mL — ABNORMAL HIGH (ref 0.0–4.7)

## 2023-03-20 ENCOUNTER — Inpatient Hospital Stay: Payer: Medicaid Other

## 2023-03-20 VITALS — BP 148/96 | HR 86 | Temp 96.0°F | Resp 18

## 2023-03-20 DIAGNOSIS — Z5111 Encounter for antineoplastic chemotherapy: Secondary | ICD-10-CM | POA: Diagnosis not present

## 2023-03-20 DIAGNOSIS — C160 Malignant neoplasm of cardia: Secondary | ICD-10-CM

## 2023-03-20 MED ORDER — SODIUM CHLORIDE 0.9% FLUSH
10.0000 mL | INTRAVENOUS | Status: DC | PRN
  Administered 2023-03-20: 10 mL
  Filled 2023-03-20: qty 10

## 2023-03-20 MED ORDER — HEPARIN SOD (PORK) LOCK FLUSH 100 UNIT/ML IV SOLN
500.0000 [IU] | Freq: Once | INTRAVENOUS | Status: AC | PRN
  Administered 2023-03-20: 500 [IU]
  Filled 2023-03-20: qty 5

## 2023-03-27 ENCOUNTER — Encounter: Payer: Self-pay | Admitting: Oncology

## 2023-03-31 ENCOUNTER — Encounter: Payer: Self-pay | Admitting: Oncology

## 2023-04-01 ENCOUNTER — Ambulatory Visit: Payer: Medicaid Other

## 2023-04-01 ENCOUNTER — Inpatient Hospital Stay: Payer: Medicaid Other

## 2023-04-01 ENCOUNTER — Encounter: Payer: Self-pay | Admitting: Oncology

## 2023-04-01 ENCOUNTER — Inpatient Hospital Stay (HOSPITAL_BASED_OUTPATIENT_CLINIC_OR_DEPARTMENT_OTHER): Payer: Medicaid Other | Admitting: Oncology

## 2023-04-01 ENCOUNTER — Other Ambulatory Visit: Payer: Self-pay | Admitting: Oncology

## 2023-04-01 VITALS — BP 162/102 | HR 91 | Temp 96.1°F | Resp 18 | Wt 243.6 lb

## 2023-04-01 DIAGNOSIS — C160 Malignant neoplasm of cardia: Secondary | ICD-10-CM

## 2023-04-01 DIAGNOSIS — K7469 Other cirrhosis of liver: Secondary | ICD-10-CM

## 2023-04-01 DIAGNOSIS — I480 Paroxysmal atrial fibrillation: Secondary | ICD-10-CM | POA: Diagnosis not present

## 2023-04-01 DIAGNOSIS — G620 Drug-induced polyneuropathy: Secondary | ICD-10-CM | POA: Diagnosis not present

## 2023-04-01 DIAGNOSIS — T451X5A Adverse effect of antineoplastic and immunosuppressive drugs, initial encounter: Secondary | ICD-10-CM

## 2023-04-01 DIAGNOSIS — Z5111 Encounter for antineoplastic chemotherapy: Secondary | ICD-10-CM

## 2023-04-01 LAB — CBC WITH DIFFERENTIAL (CANCER CENTER ONLY)
Abs Immature Granulocytes: 0.03 10*3/uL (ref 0.00–0.07)
Basophils Absolute: 0 10*3/uL (ref 0.0–0.1)
Basophils Relative: 0 %
Eosinophils Absolute: 0.4 10*3/uL (ref 0.0–0.5)
Eosinophils Relative: 8 %
HCT: 35.2 % — ABNORMAL LOW (ref 39.0–52.0)
Hemoglobin: 11.2 g/dL — ABNORMAL LOW (ref 13.0–17.0)
Immature Granulocytes: 1 %
Lymphocytes Relative: 16 %
Lymphs Abs: 0.8 10*3/uL (ref 0.7–4.0)
MCH: 28.3 pg (ref 26.0–34.0)
MCHC: 31.8 g/dL (ref 30.0–36.0)
MCV: 88.9 fL (ref 80.0–100.0)
Monocytes Absolute: 0.6 10*3/uL (ref 0.1–1.0)
Monocytes Relative: 13 %
Neutro Abs: 2.9 10*3/uL (ref 1.7–7.7)
Neutrophils Relative %: 62 %
Platelet Count: 165 10*3/uL (ref 150–400)
RBC: 3.96 MIL/uL — ABNORMAL LOW (ref 4.22–5.81)
RDW: 17.1 % — ABNORMAL HIGH (ref 11.5–15.5)
WBC Count: 4.7 10*3/uL (ref 4.0–10.5)
nRBC: 0 % (ref 0.0–0.2)

## 2023-04-01 LAB — CMP (CANCER CENTER ONLY)
ALT: 17 U/L (ref 0–44)
AST: 23 U/L (ref 15–41)
Albumin: 3.1 g/dL — ABNORMAL LOW (ref 3.5–5.0)
Alkaline Phosphatase: 109 U/L (ref 38–126)
Anion gap: 8 (ref 5–15)
BUN: 28 mg/dL — ABNORMAL HIGH (ref 6–20)
CO2: 23 mmol/L (ref 22–32)
Calcium: 8.2 mg/dL — ABNORMAL LOW (ref 8.9–10.3)
Chloride: 107 mmol/L (ref 98–111)
Creatinine: 1.44 mg/dL — ABNORMAL HIGH (ref 0.61–1.24)
GFR, Estimated: 58 mL/min — ABNORMAL LOW (ref >60)
Glucose, Bld: 154 mg/dL — ABNORMAL HIGH (ref 70–99)
Potassium: 3.9 mmol/L (ref 3.5–5.1)
Sodium: 138 mmol/L (ref 135–145)
Total Bilirubin: 0.6 mg/dL (ref 0.0–1.2)
Total Protein: 6.8 g/dL (ref 6.5–8.1)

## 2023-04-01 MED ORDER — DEXAMETHASONE SODIUM PHOSPHATE 10 MG/ML IJ SOLN
10.0000 mg | Freq: Once | INTRAMUSCULAR | Status: AC
  Administered 2023-04-01: 10 mg via INTRAVENOUS
  Filled 2023-04-01: qty 1

## 2023-04-01 MED ORDER — LEUCOVORIN CALCIUM INJECTION 350 MG: 400.0000 mg/m2 | Freq: Once | INTRAVENOUS | Status: DC

## 2023-04-01 MED ORDER — SODIUM CHLORIDE 0.9 % IV SOLN
INTRAVENOUS | Status: DC | PRN
Start: 2023-04-01 — End: 2023-04-01
  Filled 2023-04-01: qty 250

## 2023-04-01 MED ORDER — LEUCOVORIN CALCIUM INJECTION 350 MG
400.0000 mg/m2 | Freq: Once | INTRAVENOUS | Status: AC
  Administered 2023-04-01: 928 mg via INTRAVENOUS
  Filled 2023-04-01: qty 46.4

## 2023-04-01 MED ORDER — DEXTROSE 5 % IV SOLN
Freq: Once | INTRAVENOUS | Status: DC
  Filled 2023-04-01: qty 250

## 2023-04-01 MED ORDER — SODIUM CHLORIDE 0.9 % IV SOLN
2400.0000 mg/m2 | INTRAVENOUS | Status: DC
  Administered 2023-04-01: 5550 mg via INTRAVENOUS
  Filled 2023-04-01: qty 111

## 2023-04-01 MED ORDER — PALONOSETRON HCL INJECTION 0.25 MG/5ML
0.2500 mg | Freq: Once | INTRAVENOUS | Status: AC
  Administered 2023-04-01: 0.25 mg via INTRAVENOUS
  Filled 2023-04-01: qty 5

## 2023-04-01 NOTE — Patient Instructions (Signed)

## 2023-04-01 NOTE — Addendum Note (Signed)
 Addended by: Rickard Patience on: 04/01/2023 01:13 PM   Modules accepted: Orders

## 2023-04-01 NOTE — Assessment & Plan Note (Addendum)
 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  PD-L1 TPS<1%, CPS 3, TMB 8.4, MSI can not be accessed. PET scan Oct 2024 -good response, discussed with patient and sister.  Labs are reviewed and discussed with patient. Proceed with maintenance 5-FU  Repeat CT scan

## 2023-04-01 NOTE — Progress Notes (Signed)
 Hematology/Oncology Progress note [phone] [phone]    CHIEF COMPLAINTS/PURPOSE OF CONSULTATION:  GE junction cancer/gastric cardia cancer.  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  PD-L1 TPS<1%, CPS 3, TMB 8.4, MSI can not be accessed. PET scan Oct 2024 -good response, discussed with patient and sister.  Labs are reviewed and discussed with patient. Proceed with maintenance 5-FU  Repeat CT scan  Chemotherapy-induced neuropathy (HCC) Stable symptoms. He will defer gabapentin  for now.   Encounter for antineoplastic chemotherapy Chemotherapy plan as listed above.   Paroxysmal atrial fibrillation (HCC) Continue Eliquis  2.5mg  BID  Other cirrhosis of liver (HCC) LFT is stable. monitor    Orders Placed This Encounter  Procedures   CT CHEST ABDOMEN PELVIS WO CONTRAST    Standing Status:   Future    Expected Date:   04/11/2023    Expiration Date:   03/31/2024    Preferred imaging location?:   [facility]    If indicated for the ordered procedure, I authorize the administration of oral contrast media per Radiology protocol:   Yes    Does the patient have a contrast media/X-ray dye allergy?:   No   CEA    Standing Status:   Future    Expected Date:   04/15/2023    Expiration Date:   04/14/2024   CBC with Differential (Cancer Center Only)    Standing Status:   Future    Expected Date:   04/15/2023    Expiration Date:   04/14/2024   CMP (Cancer Center only)    Standing Status:   Future    Expected Date:   04/15/2023    Expiration Date:   04/14/2024   CEA    Standing Status:   Future    Expected Date:   04/29/2023    Expiration Date:   04/28/2024   CBC with  Differential (Cancer Center Only)    Standing Status:   Future    Expected Date:   04/29/2023    Expiration Date:   04/28/2024   CMP (Cancer Center only)    Standing Status:   Future    Expected Date:   04/29/2023    Expiration Date:   04/28/2024    2 weeks lab MD5 FU All questions were answered. The patient knows to call the clinic with any problems, questions or concerns.  Call Babara, MD, PhD [facility] Hematology Oncology 04/01/2023    HISTORY OF PRESENTING ILLNESS:  Jeremiah Vance 52 y.o. male presents to establish care for GE junction/gastric cardia adenocarcinoma I have reviewed his chart and materials related to his cancer extensively and collaborated history with the patient. Summary of oncologic history is as follows: Oncology History  Gastric cancer (HCC)  09/08/2022 Imaging   CT abdomen pelvis w contrast     09/08/2022 Imaging   CT abdomen pelvis with contrast  Diffuse masslike thickening in the area of the GE junction and upper stomach with the extensive abnormal lymph nodes throughout the abdomen including retrocrural, porta hepatis, retroperitoneum and lesser and greater curve of the stomach. In addition there are areas of suggest peritoneal carcinomatosis. Recommend further evaluation.    No bowel obstruction. Large midline anterior pelvic wall hernia involving small bowel  and mesenteric fat with rectus muscle diastasis. Gallstones.   09/19/2022 Cancer Staging   Staging form: Stomach, AJCC 8th Edition - Clinical stage from 09/19/2022: Stage IVB (cTX, cN2, cM1) - Signed by Babara Call, MD on 09/19/2022 Stage prefix: Initial diagnosis   09/19/2022 Initial Diagnosis   Gastric cancer (HCC)  09/08/2022, patient presented emergency room for evaluation of abdominal pain and vomiting.  He has also had weight loss.  Dysphagia with solid food for couple of months.  CT abdomen pelvis showed diffuse masslike thickening of the GE junction and upper stomach with extensive abnormal lymph  nodes throughout abdomen.  09/10/2022 upper endoscopy showed malignant appearance gastric tumor at the GE junction and in the cardia Biopsied.  Nodular mucosa in the second portion of duodenum.  Biopsied. GE junction mass showed moderate to poorly differentiated adenocarcinoma undermining squamous mucosa with focal area of squamous atypia.  Cannot exclude adenosquamous carcinoma.  Intestinal metaplasia is not identified. Duodenum cold biopsy-reactive duodenitis.  Negative for dysplasia and malignancy. Stomach random biopsy negative for H. pylori, intestinal metaplasia, dysplasia and malignancy.  Stomach Cardia mass biopsy showed moderate to poorly differentiated adenocarcinoma.  Patient's case was discussed on tumor board on 09/19/2022.  Per Dr. Janel, nephrology findings of both GE junction mass and gastric cardia mass or similar. HER2 staining negative. PD-L1 TPS<1%, CPS 3, TMB 8.4, MSI can not be accessed.  KMT2D frame shift, GATA6 copy number gain.  Molecular testing is pending.     09/23/2022 Imaging   PET scan showed 1. 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 2. Enlarged cirrhotic liver. 3. Cholelithiasis. 4. Bilateral renal stones. 5. Umbilical hernias contain unobstructed small bowel and/or fat.  6. Aortic atherosclerosis (ICD10-I70.0). Coronary artery calcification. 7. Enlarged pulmonic trunk, indicative of pulmonary arterial hypertension.    10/02/2022 -  Chemotherapy   Patient is on Treatment Plan : GASTROESOPHAGEAL FOLFOX q14d     12/31/2022 Imaging   PET showed Significant interval response to therapy.   Residual masslike thickening at the gastric cardia, corresponding to the patient's known primary gastric carcinoma, with decreased hypermetabolic activity.   Near complete resolution of prior hypermetabolic thoracic and upper abdominal lymphadenopathy. Residual AP window and perigastric nodes.   Peritoneal disease is no  longer evident.   He lives at home with his sister.  Patient is undergoing neuro physical therapy. 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 A fib previously on Eliquis , which was held due to severe iron  deficiency anemia.  Restarted in August 2024 due to subacute CVA.  11/21/2022 - 11/27/2022, patient was hospitalized.  He initially presented to emergency room due to left sided numbness.  MRI of the brain showed subacute infarct in the left frontal lobe with petechial hemorrhage and mild surrounding edema.  He has no right-sided neurologic deficits.  MRI finding was not explaining left-sided numbness.  He has chronic left sided weakness due to previous CVA.  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  Patient was discharged to rehab and was recently discharged.  S/p fall, minimally displaced distal left clavicular fracture.   INTERVAL HISTORY Jeremiah Vance is a 52 y.o. male who has above history reviewed by me today presents for follow up visit for stage IV GE junction/gastric cancer.   Denies any blood in the stool since the start of anticoagulation.  Appetite  is good.  Weight is stable.  He presents for evaluation prior to chemotherapy. + dry cough, mild SOB, not more than his baseline. No fever chills. Mother also has allergy symptoms these days.   MEDICAL HISTORY:  Past Medical History:  Diagnosis Date   Acute ischemic stroke (HCC) 09/06/2017   Acute renal failure superimposed on stage 3a chronic kidney disease (HCC) 07/08/2019   Acute right-sided weakness    Allergies    Anasarca    Anemia 07/10/2017   Arthritis    Cancer (HCC)    stomach and esophageal cancer stage 4   CHF (congestive heart failure) (HCC)    coinsided w/kidney problems I was having 06/2017   Chicken pox    CKD (chronic kidney  disease) stage 3, GFR 30-59 ml/min (HCC) 06/2017   Depression    Elevated troponin 07/08/2019   High cholesterol    History of cardiomyopathy    LVEF 40 to 45% in April 2019 - subsequently normalized   Hyperbilirubinemia 07/10/2017   Hypertension    Ischemic stroke (HCC)    Small left internal capsule infarct due to lacunar disease   Morbid obesity (HCC)    Normocytic anemia 12/16/2017   Recurrent incisional hernia with incarceration s/p repair 10/22/2017 10/21/2017   Stroke (HCC) 08/2017   right sided weakness since; getting stronger though (10/22/2017)   Type 2 diabetes mellitus (HCC)     SURGICAL HISTORY: Past Surgical History:  Procedure Laterality Date   ABDOMINAL HERNIA REPAIR  2008; 10/22/2017   scope; OPEN REPAIR INCARCERATED VENTRAL HERNIA   ESOPHAGOGASTRODUODENOSCOPY (EGD) WITH PROPOFOL  N/A 09/10/2022   Procedure: ESOPHAGOGASTRODUODENOSCOPY (EGD) WITH PROPOFOL ;  Surgeon: Unk Corinn Skiff, MD;  Location: [facility] ENDOSCOPY;  Service: Gastroenterology;  Laterality: N/A;   ESOPHAGOGASTRODUODENOSCOPY (EGD) WITH PROPOFOL  N/A 09/28/2022   Procedure: ESOPHAGOGASTRODUODENOSCOPY (EGD) WITH PROPOFOL ;  Surgeon: Onita Elspeth Sharper, DO;  Location: [facility] ENDOSCOPY;  Service: Gastroenterology;  Laterality: N/A;   HEMOSTASIS CONTROL  09/28/2022   Procedure: HEMOSTASIS CONTROL;  Surgeon: Onita Elspeth Sharper, DO;  Location: [facility] ENDOSCOPY;  Service: Gastroenterology;;   HERNIA REPAIR     KNEE ARTHROSCOPY Right 1989   PORTA CATH INSERTION N/A 09/25/2022   Procedure: PORTA CATH INSERTION;  Surgeon: Marea Selinda RAMAN, MD;  Location: [facility] INVASIVE CV LAB;  Service: Cardiovascular;  Laterality: N/A;   VENTRAL HERNIA REPAIR N/A 10/22/2017   Procedure: OPEN REPAIR INCARCERATED VENTRAL HERNIA;  Surgeon: Mikell Katz, MD;  Location: [facility] OR;  Service: General;  Laterality: N/A;    SOCIAL HISTORY: Social History   Socioeconomic History   Marital status: Divorced    Spouse name: Not on file    Number of children: Not on file   Years of education: Not on file   Highest education level: Not on file  Occupational History   Not on file  Tobacco Use   Smoking status: Never   Smokeless tobacco: Never  Vaping Use   Vaping status: Never Used  Substance and Sexual Activity   Alcohol  use: Yes    Comment: occasional beer   Drug use: Never   Sexual activity: Not Currently  Other Topics Concern   Not on file  Social History Narrative   He lives with his sister , [name] and she is his MPOA after his stokes   Social Drivers of Corporate Investment Banker Strain: High Risk (03/20/2020)   Overall Financial Resource Strain (CARDIA)    Difficulty of Paying Living Expenses: Hard  Food Insecurity: No Food Insecurity (11/27/2022)  Hunger Vital Sign    Worried About Running Out of Food in the Last Year: Never true    Ran Out of Food in the Last Year: Never true  Transportation Needs: No Transportation Needs (11/27/2022)   PRAPARE - Administrator, Civil Service (Medical): No    Lack of Transportation (Non-Medical): No  Recent Concern: Transportation Needs - Unmet Transportation Needs (09/27/2022)   PRAPARE - Administrator, Civil Service (Medical): No    Lack of Transportation (Non-Medical): Yes  Physical Activity: Not on file  Stress: Not on file  Social Connections: Not on file  Intimate Partner Violence: Not At Risk (11/27/2022)   Humiliation, Afraid, Rape, and Kick questionnaire    Fear of Current or Ex-Partner: No    Emotionally Abused: No    Physically Abused: No    Sexually Abused: No    FAMILY HISTORY: Family History  Problem Relation Age of Onset   Diabetes Mother    Stroke Mother    Arthritis Mother    Depression Mother    Heart disease Mother    Hypertension Mother    Learning disabilities Mother    Mental illness Mother    Sleep apnea Mother    Diabetes Father    Heart disease Father    Arthritis Father    Hearing loss Father     Hyperlipidemia Father    Heart attack Father    Hypertension Father    Stroke Father    Diabetes Sister    Depression Sister    Diabetes Sister    Hypertension Sister    Mental illness Sister    Sleep apnea Sister    Diabetes Maternal Grandmother    Heart disease Maternal Grandmother    Depression Maternal Grandmother    Hyperlipidemia Maternal Grandmother    Hypertension Maternal Grandmother    Stroke Maternal Grandmother    Hypertension Maternal Grandfather    Hyperlipidemia Maternal Grandfather    Stroke Maternal Grandfather    Arthritis Paternal Grandmother    Hearing loss Paternal Grandmother    Stroke Paternal Grandfather    Heart disease Paternal Grandfather    Arthritis Paternal Grandfather    Heart attack Paternal Grandfather    Melanoma Other     ALLERGIES:  is allergic to pollen extract and sulfa antibiotics.  MEDICATIONS:  Current Outpatient Medications  Medication Sig Dispense Refill   acetaminophen  (TYLENOL ) 325 MG tablet Take 2 tablets (650 mg total) by mouth every 4 (four) hours as needed for mild pain (or temp > 37.5 C (99.5 F)).     atorvastatin  (LIPITOR) 40 MG tablet Take 1 tablet (40 mg total) by mouth daily at 6 PM. 90 tablet 3   busPIRone  (BUSPAR ) 7.5 MG tablet Take 1 tablet (7.5 mg total) by mouth 2 (two) times daily. 180 tablet 1   Continuous Glucose Sensor (FREESTYLE LIBRE 2 SENSOR) MISC Place 1 sensor on the skin every 14 days. Use to check glucose continuously 2 each 11   Continuous Glucose Sensor (FREESTYLE LIBRE 3 SENSOR) MISC Place 1 sensor on the skin every 14 days. Use to check glucose continuously 2 each 11   dapagliflozin  propanediol (FARXIGA ) 10 MG TABS tablet Take 1 tablet (10 mg total) by mouth daily. 30 tablet 0   dexamethasone  (DECADRON ) 4 MG tablet TAKE 2 TABLETS(8 MG) BY MOUTH DAILY. START THE DAY AFTER CHEMOTHERAPY FOR 2 DAYS. TAKE WITH FOOD 30 tablet 1   diclofenac  Sodium (VOLTAREN ) 1 % GEL Apply  4 g topically 4 (four) times daily.  (Patient taking differently: Apply 4 g topically 4 (four) times daily as needed.)     hydrALAZINE  (APRESOLINE ) 25 MG tablet TAKE 1 TABLET(25 MG) BY MOUTH EVERY 8 HOURS 90 tablet 1   oxyCODONE  (OXY IR/ROXICODONE ) 5 MG immediate release tablet Take 1 tablet (5 mg total) by mouth every 6 (six) hours as needed for severe pain (pain score 7-10). 60 tablet 0   pantoprazole  (PROTONIX ) 40 MG tablet Take 1 tablet (40 mg total) by mouth 2 (two) times daily before a meal. 60 tablet 3   scopolamine  (TRANSDERM-SCOP) 1 MG/3DAYS Place 1 patch (1.5 mg total) onto the skin every 3 (three) days. 4 patch 0   sertraline  (ZOLOFT ) 25 MG tablet Take 1 tablet (25 mg total) by mouth daily. 30 tablet 3   sertraline  (ZOLOFT ) 50 MG tablet Take 1 tablet (50 mg total) by mouth daily. 30 tablet 3   ELIQUIS  2.5 MG TABS tablet TAKE 1 TABLET(2.5 MG) BY MOUTH TWICE DAILY 60 tablet 2   ondansetron  (ZOFRAN -ODT) 8 MG disintegrating tablet Take 1 tablet (8 mg total) by mouth every 8 (eight) hours as needed for nausea or vomiting. (Patient not taking: Reported on 03/04/2023) 60 tablet 1   prochlorperazine  (COMPAZINE ) 10 MG tablet Take 1 tablet (10 mg total) by mouth every 6 (six) hours as needed for nausea or vomiting. (Patient not taking: Reported on 03/04/2023) 60 tablet 0   torsemide  (DEMADEX ) 10 MG tablet Take 1 tablet (10 mg total) by mouth daily as needed (lower ext swelling). (Patient not taking: Reported on 03/04/2023) 30 tablet 0   No current facility-administered medications for this visit.   Facility-Administered Medications Ordered in Other Visits  Medication Dose Route Frequency Provider Last Rate Last Admin   0.9 %  sodium chloride  infusion   Intravenous PRN Ledora Delker, MD   Stopped at 04/01/23 1143   dextrose  5 % solution   Intravenous Once Babara Call, MD       fluorouracil  (ADRUCIL ) 5,550 mg in sodium chloride  0.9 % 139 mL chemo infusion  2,400 mg/m2 (Treatment Plan Recorded) Intravenous 1 day or 1 dose Babara Call, MD   Infusion  Verify at 04/01/23 1147    Review of Systems  Constitutional:  Positive for fatigue. Negative for appetite change, chills, fever and unexpected weight change.  HENT:   Negative for hearing loss and voice change.   Eyes:  Negative for eye problems and icterus.  Respiratory:  Positive for cough. Negative for chest tightness and shortness of breath.   Cardiovascular:  Negative for chest pain and leg swelling.  Gastrointestinal:  Negative for abdominal distention, abdominal pain and nausea.  Endocrine: Negative for hot flashes.  Genitourinary:  Negative for difficulty urinating, dysuria and frequency.   Musculoskeletal:  Negative for arthralgias.  Skin:  Negative for itching and rash.  Neurological:  Positive for numbness. Negative for light-headedness.  Hematological:  Negative for adenopathy. Does not bruise/bleed easily.  Psychiatric/Behavioral:  Negative for confusion.      PHYSICAL EXAMINATION: ECOG PERFORMANCE STATUS: 2 - Symptomatic, <50% confined to bed  Vitals:   04/01/23 0924 04/01/23 0935  BP: (!) 179/91 (!) 162/102  Pulse: 91   Resp: 18   Temp: (!) 96.1 F (35.6 C)   SpO2: 97%    Filed Weights   04/01/23 0924  Weight: 243 lb 9.6 oz (110.5 kg)    Physical Exam Constitutional:      General: He is not in acute distress.  Appearance: He is obese. He is not diaphoretic.  HENT:     Head: Normocephalic and atraumatic.  Eyes:     General: No scleral icterus.    Pupils: Pupils are equal, round, and reactive to light.  Cardiovascular:     Rate and Rhythm: Normal rate.  Pulmonary:     Effort: Pulmonary effort is normal. No respiratory distress.  Abdominal:     General: There is no distension.     Palpations: Abdomen is soft.     Tenderness: There is no abdominal tenderness.  Musculoskeletal:        General: Normal range of motion.     Cervical back: Normal range of motion and neck supple.     Left lower leg: Edema present.  Skin:    General: Skin is warm and  dry.  Neurological:     Mental Status: He is alert and oriented to person, place, and time. Mental status is at baseline.     Cranial Nerves: No cranial nerve deficit.     Motor: No abnormal muscle tone.  Psychiatric:        Mood and Affect: Mood and affect normal.      LABORATORY DATA:  I have reviewed the data as listed    Latest Ref Rng & Units 04/01/2023    9:05 AM 03/18/2023    8:39 AM 03/04/2023    9:06 AM  CBC  WBC 4.0 - 10.5 K/uL 4.7  5.7  6.1   Hemoglobin 13.0 - 17.0 g/dL 88.7  88.5  88.8   Hematocrit 39.0 - 52.0 % 35.2  35.8  35.5   Platelets 150 - 400 K/uL 165  160  182       Latest Ref Rng & Units 04/01/2023    9:05 AM 03/18/2023    8:39 AM 03/04/2023    9:06 AM  CMP  Glucose 70 - 99 mg/dL 845  859  801   BUN 6 - 20 mg/dL 28  28  33   Creatinine 0.61 - 1.24 mg/dL 8.55  8.58  8.68   Sodium 135 - 145 mmol/L 138  139  141   Potassium 3.5 - 5.1 mmol/L 3.9  3.8  4.2   Chloride 98 - 111 mmol/L 107  108  110   CO2 22 - 32 mmol/L 23  22  23    Calcium  8.9 - 10.3 mg/dL 8.2  8.5  8.4   Total Protein 6.5 - 8.1 g/dL 6.8  6.9  6.8   Total Bilirubin 0.0 - 1.2 mg/dL 0.6  0.7  0.6   Alkaline Phos 38 - 126 U/L 109  107  110   AST 15 - 41 U/L 23  28  24    ALT 0 - 44 U/L 17  19  16       RADIOGRAPHIC STUDIES: I have personally reviewed the radiological images as listed and agreed with the findings in the report. No results found.

## 2023-04-01 NOTE — Assessment & Plan Note (Signed)
Continue Eliquis 2.5mg BID.  

## 2023-04-01 NOTE — Addendum Note (Signed)
 Addended by: Sharen Hones on: 04/01/2023 01:29 PM   Modules accepted: Orders

## 2023-04-01 NOTE — Assessment & Plan Note (Signed)
 LFT is stable. monitor

## 2023-04-01 NOTE — Assessment & Plan Note (Signed)
Chemotherapy plan as listed above 

## 2023-04-01 NOTE — Assessment & Plan Note (Signed)
 Stable symptoms. He will defer gabapentin for now.

## 2023-04-02 LAB — CEA: CEA: 7.4 ng/mL — ABNORMAL HIGH (ref 0.0–4.7)

## 2023-04-03 ENCOUNTER — Inpatient Hospital Stay: Payer: Medicaid Other | Attending: Oncology

## 2023-04-03 DIAGNOSIS — C772 Secondary and unspecified malignant neoplasm of intra-abdominal lymph nodes: Secondary | ICD-10-CM | POA: Insufficient documentation

## 2023-04-03 DIAGNOSIS — Z79899 Other long term (current) drug therapy: Secondary | ICD-10-CM | POA: Diagnosis not present

## 2023-04-03 DIAGNOSIS — C160 Malignant neoplasm of cardia: Secondary | ICD-10-CM | POA: Diagnosis present

## 2023-04-03 DIAGNOSIS — Z5111 Encounter for antineoplastic chemotherapy: Secondary | ICD-10-CM | POA: Insufficient documentation

## 2023-04-03 DIAGNOSIS — C786 Secondary malignant neoplasm of retroperitoneum and peritoneum: Secondary | ICD-10-CM | POA: Insufficient documentation

## 2023-04-03 MED ORDER — HEPARIN SOD (PORK) LOCK FLUSH 100 UNIT/ML IV SOLN
500.0000 [IU] | Freq: Once | INTRAVENOUS | Status: AC | PRN
  Administered 2023-04-03: 500 [IU]
  Filled 2023-04-03: qty 5

## 2023-04-04 DIAGNOSIS — C160 Malignant neoplasm of cardia: Secondary | ICD-10-CM | POA: Diagnosis not present

## 2023-04-08 ENCOUNTER — Encounter: Payer: Self-pay | Admitting: Oncology

## 2023-04-10 ENCOUNTER — Ambulatory Visit
Admission: RE | Admit: 2023-04-10 | Discharge: 2023-04-10 | Disposition: A | Discharge status: Home / Self Care | Payer: Medicaid Other | Source: Ambulatory Visit | Attending: Oncology | Admitting: Oncology

## 2023-04-10 DIAGNOSIS — C779 Secondary and unspecified malignant neoplasm of lymph node, unspecified: Secondary | ICD-10-CM | POA: Diagnosis not present

## 2023-04-10 DIAGNOSIS — C160 Malignant neoplasm of cardia: Secondary | ICD-10-CM | POA: Insufficient documentation

## 2023-04-10 DIAGNOSIS — C169 Malignant neoplasm of stomach, unspecified: Secondary | ICD-10-CM | POA: Diagnosis not present

## 2023-04-10 DIAGNOSIS — R188 Other ascites: Secondary | ICD-10-CM | POA: Diagnosis not present

## 2023-04-10 DIAGNOSIS — J432 Centrilobular emphysema: Secondary | ICD-10-CM | POA: Diagnosis not present

## 2023-04-15 ENCOUNTER — Ambulatory Visit: Payer: Medicaid Other

## 2023-04-15 ENCOUNTER — Inpatient Hospital Stay: Payer: Medicaid Other

## 2023-04-15 ENCOUNTER — Other Ambulatory Visit: Payer: Medicaid Other

## 2023-04-15 ENCOUNTER — Ambulatory Visit: Payer: Medicaid Other | Admitting: Oncology

## 2023-04-15 ENCOUNTER — Encounter: Payer: Self-pay | Admitting: Oncology

## 2023-04-15 ENCOUNTER — Inpatient Hospital Stay (HOSPITAL_BASED_OUTPATIENT_CLINIC_OR_DEPARTMENT_OTHER): Payer: Medicaid Other | Admitting: Oncology

## 2023-04-15 VITALS — BP 163/91 | HR 96 | Temp 96.5°F | Resp 18 | Wt 243.0 lb

## 2023-04-15 VITALS — BP 147/95 | HR 92 | Temp 96.0°F | Resp 19

## 2023-04-15 DIAGNOSIS — Z5111 Encounter for antineoplastic chemotherapy: Secondary | ICD-10-CM | POA: Diagnosis not present

## 2023-04-15 DIAGNOSIS — G620 Drug-induced polyneuropathy: Secondary | ICD-10-CM | POA: Diagnosis not present

## 2023-04-15 DIAGNOSIS — C160 Malignant neoplasm of cardia: Secondary | ICD-10-CM

## 2023-04-15 DIAGNOSIS — N183 Chronic kidney disease, stage 3 unspecified: Secondary | ICD-10-CM

## 2023-04-15 DIAGNOSIS — T451X5A Adverse effect of antineoplastic and immunosuppressive drugs, initial encounter: Secondary | ICD-10-CM

## 2023-04-15 DIAGNOSIS — I480 Paroxysmal atrial fibrillation: Secondary | ICD-10-CM

## 2023-04-15 DIAGNOSIS — E860 Dehydration: Secondary | ICD-10-CM

## 2023-04-15 DIAGNOSIS — K7469 Other cirrhosis of liver: Secondary | ICD-10-CM

## 2023-04-15 LAB — CBC WITH DIFFERENTIAL (CANCER CENTER ONLY)
Abs Immature Granulocytes: 0.05 K/uL (ref 0.00–0.07)
Basophils Absolute: 0.1 K/uL (ref 0.0–0.1)
Basophils Relative: 1 %
Eosinophils Absolute: 0.4 K/uL (ref 0.0–0.5)
Eosinophils Relative: 5 %
HCT: 38.4 % — ABNORMAL LOW (ref 39.0–52.0)
Hemoglobin: 11.9 g/dL — ABNORMAL LOW (ref 13.0–17.0)
Immature Granulocytes: 1 %
Lymphocytes Relative: 10 %
Lymphs Abs: 0.8 K/uL (ref 0.7–4.0)
MCH: 27.4 pg (ref 26.0–34.0)
MCHC: 31 g/dL (ref 30.0–36.0)
MCV: 88.5 fL (ref 80.0–100.0)
Monocytes Absolute: 1 K/uL (ref 0.1–1.0)
Monocytes Relative: 13 %
Neutro Abs: 5.7 K/uL (ref 1.7–7.7)
Neutrophils Relative %: 70 %
Platelet Count: 202 K/uL (ref 150–400)
RBC: 4.34 MIL/uL (ref 4.22–5.81)
RDW: 17.1 % — ABNORMAL HIGH (ref 11.5–15.5)
WBC Count: 8 K/uL (ref 4.0–10.5)
nRBC: 0 % (ref 0.0–0.2)

## 2023-04-15 LAB — CMP (CANCER CENTER ONLY)
ALT: 11 U/L (ref 0–44)
AST: 18 U/L (ref 15–41)
Albumin: 3 g/dL — ABNORMAL LOW (ref 3.5–5.0)
Alkaline Phosphatase: 112 U/L (ref 38–126)
Anion gap: 11 (ref 5–15)
BUN: 38 mg/dL — ABNORMAL HIGH (ref 6–20)
CO2: 20 mmol/L — ABNORMAL LOW (ref 22–32)
Calcium: 8.5 mg/dL — ABNORMAL LOW (ref 8.9–10.3)
Chloride: 108 mmol/L (ref 98–111)
Creatinine: 2.21 mg/dL — ABNORMAL HIGH (ref 0.61–1.24)
GFR, Estimated: 35 mL/min — ABNORMAL LOW (ref >60)
Glucose, Bld: 107 mg/dL — ABNORMAL HIGH (ref 70–99)
Potassium: 3.6 mmol/L (ref 3.5–5.1)
Sodium: 139 mmol/L (ref 135–145)
Total Bilirubin: 1 mg/dL (ref 0.0–1.2)
Total Protein: 6.9 g/dL (ref 6.5–8.1)

## 2023-04-15 LAB — SAMPLE TO BLOOD BANK

## 2023-04-15 MED ORDER — SODIUM CHLORIDE 0.9 % IV SOLN
2400.0000 mg/m2 | INTRAVENOUS | Status: DC
  Administered 2023-04-15: 5550 mg via INTRAVENOUS
  Filled 2023-04-15: qty 111

## 2023-04-15 MED ORDER — PROCHLORPERAZINE MALEATE 10 MG PO TABS
10.0000 mg | ORAL_TABLET | Freq: Once | ORAL | Status: AC
  Administered 2023-04-15: 10 mg via ORAL
  Filled 2023-04-15: qty 1

## 2023-04-15 MED ORDER — DEXTROSE 5 % IV SOLN
Freq: Once | INTRAVENOUS | Status: AC
  Filled 2023-04-15: qty 250

## 2023-04-15 MED ORDER — SODIUM CHLORIDE 0.9 % IV SOLN
Freq: Once | INTRAVENOUS | Status: AC
  Filled 2023-04-15: qty 250

## 2023-04-15 MED ORDER — ACETAMINOPHEN 325 MG PO TABS
650.0000 mg | ORAL_TABLET | Freq: Once | ORAL | Status: AC
  Administered 2023-04-15: 650 mg via ORAL

## 2023-04-15 MED ORDER — DEXTROSE 5 % IV SOLN
400.0000 mg/m2 | Freq: Once | INTRAVENOUS | Status: AC
  Administered 2023-04-15: 928 mg via INTRAVENOUS
  Filled 2023-04-15: qty 46.4

## 2023-04-15 MED ORDER — FLUOROURACIL CHEMO INJECTION 2.5 GM/50ML
400.0000 mg/m2 | Freq: Once | INTRAVENOUS | Status: AC
  Administered 2023-04-15: 1000 mg via INTRAVENOUS
  Filled 2023-04-15: qty 20

## 2023-04-15 NOTE — Assessment & Plan Note (Addendum)
 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  PD-L1 TPS<1%, CPS 3, TMB 8.4, MSI can not be accessed. 1st line treatment FOLFOX s/p 12 cycles, now on 5-FU maintenance.   Labs are reviewed and discussed with patient. CT showed stable diease overall, mild progression in chest.  Proceed with maintenance 5-FU

## 2023-04-15 NOTE — Assessment & Plan Note (Signed)
 LFT is stable. monitor

## 2023-04-15 NOTE — Assessment & Plan Note (Signed)
 Encourage oral hydration and avoid nephrotoxins.  Creatinine is worse, AKI on CKD. Likely due to decreased oral intake yesterday.  Recommend IVF hydration with 1L NS over 1 hour.

## 2023-04-15 NOTE — Progress Notes (Signed)
 Hematology/Oncology Progress note [phone] [phone]    CHIEF COMPLAINTS/PURPOSE OF CONSULTATION:  GE junction cancer/gastric cardia cancer.  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  PD-L1 TPS<1%, CPS 3, TMB 8.4, MSI can not be accessed. 1st line treatment FOLFOX s/p 12 cycles, now on 5-FU maintenance.   Labs are reviewed and discussed with patient. CT showed stable diease overall, mild progression in chest.  Proceed with maintenance 5-FU    CKD (chronic kidney disease) stage 3, GFR 30-59 ml/min (HCC) Encourage oral hydration and avoid nephrotoxins.  Creatinine is worse, AKI on CKD. Likely due to decreased oral intake yesterday.  Recommend IVF hydration with 1L NS over 1 hour.    Chemotherapy-induced neuropathy (HCC) Stable symptoms. He will defer gabapentin  for now.   Encounter for antineoplastic chemotherapy Chemotherapy plan as listed above.   Other cirrhosis of liver (HCC) LFT is stable. monitor  Paroxysmal atrial fibrillation (HCC) Continue Eliquis  2.5mg  BID     Orders Placed This Encounter  Procedures   CEA    Standing Status:   Future    Expected Date:   05/13/2023    Expiration Date:   05/12/2024   CBC with Differential (Cancer Center Only)    Standing Status:   Future    Expected Date:   05/13/2023    Expiration Date:   05/12/2024   CMP (Cancer Center only)    Standing Status:   Future    Expected Date:   05/13/2023    Expiration Date:   05/12/2024   CEA    Standing Status:   Future    Expected Date:   05/27/2023    Expiration Date:   05/26/2024   CBC with Differential (Cancer Center Only)    Standing Status:   Future    Expected Date:   05/27/2023     Expiration Date:   05/26/2024   CMP (Cancer Center only)    Standing Status:   Future    Expected Date:   05/27/2023    Expiration Date:   05/26/2024    2 weeks lab MD 5 FU All questions were answered. The patient knows to call the clinic with any problems, questions or concerns.  Call Babara, MD, PhD [facility] Hematology Oncology 04/15/2023    HISTORY OF PRESENTING ILLNESS:  Jeremiah Vance 53 y.o. male presents to establish care for GE junction/gastric cardia adenocarcinoma I have reviewed his chart and materials related to his cancer extensively and collaborated history with the patient. Summary of oncologic history is as follows: Oncology History  Gastric cancer (HCC)  09/08/2022 Imaging   CT abdomen pelvis w contrast     09/08/2022 Imaging   CT abdomen pelvis with contrast  Diffuse masslike thickening in the area of the GE junction and upper stomach with the extensive abnormal lymph nodes throughout the abdomen including retrocrural, porta hepatis, retroperitoneum and lesser and greater curve of the stomach. In addition there are areas of suggest peritoneal carcinomatosis. Recommend further evaluation.    No bowel obstruction. Large midline anterior pelvic wall hernia involving small bowel and mesenteric fat with rectus muscle diastasis. Gallstones.   09/19/2022 Cancer Staging   Staging form: Stomach, AJCC 8th Edition - Clinical stage from 09/19/2022:  Stage IVB (cTX, cN2, cM1) - Signed by Babara Call, MD on 09/19/2022 Stage prefix: Initial diagnosis   09/19/2022 Initial Diagnosis   Gastric cancer (HCC)  09/08/2022, patient presented emergency room for evaluation of abdominal pain and vomiting.  He has also had weight loss.  Dysphagia with solid food for couple of months.  CT abdomen pelvis showed diffuse masslike thickening of the GE junction and upper stomach with extensive abnormal lymph nodes throughout abdomen.  09/10/2022 upper endoscopy showed malignant appearance gastric tumor  at the GE junction and in the cardia Biopsied.  Nodular mucosa in the second portion of duodenum.  Biopsied. GE junction mass showed moderate to poorly differentiated adenocarcinoma undermining squamous mucosa with focal area of squamous atypia.  Cannot exclude adenosquamous carcinoma.  Intestinal metaplasia is not identified. Duodenum cold biopsy-reactive duodenitis.  Negative for dysplasia and malignancy. Stomach random biopsy negative for H. pylori, intestinal metaplasia, dysplasia and malignancy.  Stomach Cardia mass biopsy showed moderate to poorly differentiated adenocarcinoma.  Patient's case was discussed on tumor board on 09/19/2022.  Per Dr. Janel, nephrology findings of both GE junction mass and gastric cardia mass or similar. HER2 staining negative. PD-L1 TPS<1%, CPS 3, TMB 8.4, MSI can not be accessed.  KMT2D frame shift, GATA6 copy number gain.  Molecular testing is pending.     09/23/2022 Imaging   PET scan showed 1. 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 2. Enlarged cirrhotic liver. 3. Cholelithiasis. 4. Bilateral renal stones. 5. Umbilical hernias contain unobstructed small bowel and/or fat.  6. Aortic atherosclerosis (ICD10-I70.0). Coronary artery calcification. 7. Enlarged pulmonic trunk, indicative of pulmonary arterial hypertension.    10/02/2022 -  Chemotherapy   Patient is on Treatment Plan : GASTROESOPHAGEAL FOLFOX q14d     12/31/2022 Imaging   PET showed Significant interval response to therapy.   Residual masslike thickening at the gastric cardia, corresponding to the patient's known primary gastric carcinoma, with decreased hypermetabolic activity.   Near complete resolution of prior hypermetabolic thoracic and upper abdominal lymphadenopathy. Residual AP window and perigastric nodes.   Peritoneal disease is no longer evident.   He lives at home with his sister.  Patient is undergoing neuro physical  therapy. 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 A fib previously on Eliquis , which was held due to severe iron  deficiency anemia.  Restarted in August 2024 due to subacute CVA.  11/21/2022 - 11/27/2022, patient was hospitalized.  He initially presented to emergency room due to left sided numbness.  MRI of the brain showed subacute infarct in the left frontal lobe with petechial hemorrhage and mild surrounding edema.  He has no right-sided neurologic deficits.  MRI finding was not explaining left-sided numbness.  He has chronic left sided weakness due to previous CVA.  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  Patient was discharged to rehab and was recently discharged.  S/p fall, minimally displaced distal left clavicular fracture.   INTERVAL HISTORY Jeremiah Vance is a 53 y.o. male who has above history reviewed by me today presents for follow up visit for stage IV GE junction/gastric cancer.   Denies any blood in the stool since the start of anticoagulation.  Weight is stable.  He presents for evaluation prior to chemotherapy. yesterday he didn't feel good and didn't eat or drink anything until about 6:30 at night.  he said he was dizzy yesterday too. As of today he feels well, and has had one protein shake and a little bit of a soda. No nausea vomiting diarrhea.   MEDICAL HISTORY:  Past Medical History:  Diagnosis Date   Acute ischemic stroke (HCC) 09/06/2017   Acute renal failure superimposed on stage 3a chronic kidney disease (HCC) 07/08/2019   Acute right-sided weakness    Allergies    Anasarca    Anemia 07/10/2017   Arthritis    Cancer (HCC)    stomach and esophageal cancer stage 4   CHF (congestive heart failure) (HCC)    coinsided w/kidney problems I was having 06/2017   Chicken pox    CKD  (chronic kidney disease) stage 3, GFR 30-59 ml/min (HCC) 06/2017   Depression    Elevated troponin 07/08/2019   High cholesterol    History of cardiomyopathy    LVEF 40 to 45% in April 2019 - subsequently normalized   Hyperbilirubinemia 07/10/2017   Hypertension    Ischemic stroke (HCC)    Small left internal capsule infarct due to lacunar disease   Morbid obesity (HCC)    Normocytic anemia 12/16/2017   Recurrent incisional hernia with incarceration s/p repair 10/22/2017 10/21/2017   Stroke (HCC) 08/2017   right sided weakness since; getting stronger though (10/22/2017)   Type 2 diabetes mellitus (HCC)     SURGICAL HISTORY: Past Surgical History:  Procedure Laterality Date   ABDOMINAL HERNIA REPAIR  2008; 10/22/2017   scope; OPEN REPAIR INCARCERATED VENTRAL HERNIA   ESOPHAGOGASTRODUODENOSCOPY (EGD) WITH PROPOFOL  N/A 09/10/2022   Procedure: ESOPHAGOGASTRODUODENOSCOPY (EGD) WITH PROPOFOL ;  Surgeon: Unk Corinn Skiff, MD;  Location: [facility] ENDOSCOPY;  Service: Gastroenterology;  Laterality: N/A;   ESOPHAGOGASTRODUODENOSCOPY (EGD) WITH PROPOFOL  N/A 09/28/2022   Procedure: ESOPHAGOGASTRODUODENOSCOPY (EGD) WITH PROPOFOL ;  Surgeon: Onita Elspeth Sharper, DO;  Location: [facility] ENDOSCOPY;  Service: Gastroenterology;  Laterality: N/A;   HEMOSTASIS CONTROL  09/28/2022   Procedure: HEMOSTASIS CONTROL;  Surgeon: Onita Elspeth Sharper, DO;  Location: [facility] ENDOSCOPY;  Service: Gastroenterology;;   HERNIA REPAIR     KNEE ARTHROSCOPY Right 1989   PORTA CATH INSERTION N/A 09/25/2022   Procedure: PORTA CATH INSERTION;  Surgeon: Marea Selinda RAMAN, MD;  Location: [facility] INVASIVE CV LAB;  Service: Cardiovascular;  Laterality: N/A;   VENTRAL HERNIA REPAIR N/A 10/22/2017   Procedure: OPEN REPAIR INCARCERATED VENTRAL HERNIA;  Surgeon: Mikell Katz, MD;  Location: [facility] OR;  Service: General;  Laterality: N/A;    SOCIAL HISTORY: Social History   Socioeconomic History   Marital status: Divorced    Spouse name:  Not on file   Number of children: Not on file   Years of education: Not on file   Highest education level: Not on file  Occupational History   Not on file  Tobacco Use   Smoking status: Never   Smokeless tobacco: Never  Vaping Use   Vaping status: Never Used  Substance and Sexual Activity   Alcohol  use: Yes    Comment: occasional beer   Drug use: Never   Sexual activity: Not Currently  Other Topics Concern   Not on file  Social History Narrative   He lives with his sister , [name] and she is his MPOA after his stokes   Social Drivers of Corporate Investment Banker Strain: High Risk (03/20/2020)   Overall Financial Resource Strain (CARDIA)    Difficulty of Paying Living Expenses: Hard  Food Insecurity: No Food Insecurity (11/27/2022)   Hunger Vital Sign  Worried About Programme Researcher, Broadcasting/film/video in the Last Year: Never true    Ran Out of Food in the Last Year: Never true  Transportation Needs: No Transportation Needs (11/27/2022)   PRAPARE - Administrator, Civil Service (Medical): No    Lack of Transportation (Non-Medical): No  Recent Concern: Transportation Needs - Unmet Transportation Needs (09/27/2022)   PRAPARE - Administrator, Civil Service (Medical): No    Lack of Transportation (Non-Medical): Yes  Physical Activity: Not on file  Stress: Not on file  Social Connections: Not on file  Intimate Partner Violence: Not At Risk (11/27/2022)   Humiliation, Afraid, Rape, and Kick questionnaire    Fear of Current or Ex-Partner: No    Emotionally Abused: No    Physically Abused: No    Sexually Abused: No    FAMILY HISTORY: Family History  Problem Relation Age of Onset   Diabetes Mother    Stroke Mother    Arthritis Mother    Depression Mother    Heart disease Mother    Hypertension Mother    Learning disabilities Mother    Mental illness Mother    Sleep apnea Mother    Diabetes Father    Heart disease Father    Arthritis Father    Hearing  loss Father    Hyperlipidemia Father    Heart attack Father    Hypertension Father    Stroke Father    Diabetes Sister    Depression Sister    Diabetes Sister    Hypertension Sister    Mental illness Sister    Sleep apnea Sister    Diabetes Maternal Grandmother    Heart disease Maternal Grandmother    Depression Maternal Grandmother    Hyperlipidemia Maternal Grandmother    Hypertension Maternal Grandmother    Stroke Maternal Grandmother    Hypertension Maternal Grandfather    Hyperlipidemia Maternal Grandfather    Stroke Maternal Grandfather    Arthritis Paternal Grandmother    Hearing loss Paternal Grandmother    Stroke Paternal Grandfather    Heart disease Paternal Grandfather    Arthritis Paternal Grandfather    Heart attack Paternal Grandfather    Melanoma Other     ALLERGIES:  is allergic to pollen extract and sulfa antibiotics.  MEDICATIONS:  Current Outpatient Medications  Medication Sig Dispense Refill   acetaminophen  (TYLENOL ) 325 MG tablet Take 2 tablets (650 mg total) by mouth every 4 (four) hours as needed for mild pain (or temp > 37.5 C (99.5 F)).     atorvastatin  (LIPITOR) 40 MG tablet Take 1 tablet (40 mg total) by mouth daily at 6 PM. 90 tablet 3   busPIRone  (BUSPAR ) 7.5 MG tablet Take 1 tablet (7.5 mg total) by mouth 2 (two) times daily. 180 tablet 1   Continuous Glucose Sensor (FREESTYLE LIBRE 2 SENSOR) MISC Place 1 sensor on the skin every 14 days. Use to check glucose continuously 2 each 11   Continuous Glucose Sensor (FREESTYLE LIBRE 3 SENSOR) MISC Place 1 sensor on the skin every 14 days. Use to check glucose continuously 2 each 11   dapagliflozin  propanediol (FARXIGA ) 10 MG TABS tablet Take 1 tablet (10 mg total) by mouth daily. 30 tablet 0   dexamethasone  (DECADRON ) 4 MG tablet TAKE 2 TABLETS(8 MG) BY MOUTH DAILY. START THE DAY AFTER CHEMOTHERAPY FOR 2 DAYS. TAKE WITH FOOD 30 tablet 1   diclofenac  Sodium (VOLTAREN ) 1 % GEL Apply 4 g topically 4 (four)  times daily. (Patient taking differently: Apply 4 g topically 4 (four) times daily as needed.)     ELIQUIS  2.5 MG TABS tablet TAKE 1 TABLET(2.5 MG) BY MOUTH TWICE DAILY 60 tablet 2   hydrALAZINE  (APRESOLINE ) 25 MG tablet TAKE 1 TABLET(25 MG) BY MOUTH EVERY 8 HOURS 90 tablet 1   oxyCODONE  (OXY IR/ROXICODONE ) 5 MG immediate release tablet Take 1 tablet (5 mg total) by mouth every 6 (six) hours as needed for severe pain (pain score 7-10). 60 tablet 0   pantoprazole  (PROTONIX ) 40 MG tablet Take 1 tablet (40 mg total) by mouth 2 (two) times daily before a meal. 60 tablet 3   prochlorperazine  (COMPAZINE ) 10 MG tablet Take 1 tablet (10 mg total) by mouth every 6 (six) hours as needed for nausea or vomiting. 60 tablet 0   scopolamine  (TRANSDERM-SCOP) 1 MG/3DAYS Place 1 patch (1.5 mg total) onto the skin every 3 (three) days. 4 patch 0   sertraline  (ZOLOFT ) 25 MG tablet Take 1 tablet (25 mg total) by mouth daily. 30 tablet 3   sertraline  (ZOLOFT ) 50 MG tablet Take 1 tablet (50 mg total) by mouth daily. 30 tablet 3   ondansetron  (ZOFRAN -ODT) 8 MG disintegrating tablet Take 1 tablet (8 mg total) by mouth every 8 (eight) hours as needed for nausea or vomiting. (Patient not taking: Reported on 04/15/2023) 60 tablet 1   torsemide  (DEMADEX ) 10 MG tablet Take 1 tablet (10 mg total) by mouth daily as needed (lower ext swelling). (Patient not taking: Reported on 04/15/2023) 30 tablet 0   No current facility-administered medications for this visit.   Facility-Administered Medications Ordered in Other Visits  Medication Dose Route Frequency Provider Last Rate Last Admin   0.9 %  sodium chloride  infusion   Intravenous Once Babara Call, MD        Review of Systems  Constitutional:  Positive for fatigue. Negative for appetite change, chills, fever and unexpected weight change.  HENT:   Negative for hearing loss and voice change.   Eyes:  Negative for eye problems and icterus.  Respiratory:  Negative for chest tightness,  cough and shortness of breath.   Cardiovascular:  Negative for chest pain and leg swelling.  Gastrointestinal:  Negative for abdominal distention, abdominal pain and nausea.  Endocrine: Negative for hot flashes.  Genitourinary:  Negative for difficulty urinating, dysuria and frequency.   Musculoskeletal:  Negative for arthralgias.  Skin:  Negative for itching and rash.  Neurological:  Positive for numbness. Negative for light-headedness.  Hematological:  Negative for adenopathy. Does not bruise/bleed easily.     PHYSICAL EXAMINATION: ECOG PERFORMANCE STATUS: 2 - Symptomatic, <50% confined to bed  Vitals:   04/15/23 0901 04/15/23 0912  BP: (!) 159/88 (!) 163/91  Pulse: 96   Resp: 18   Temp: (!) 96.5 F (35.8 C)   SpO2: 95%    Filed Weights   04/15/23 0901  Weight: 243 lb (110.2 kg)    Physical Exam Constitutional:      General: He is not in acute distress.    Appearance: He is obese. He is not diaphoretic.  HENT:     Head: Normocephalic and atraumatic.  Eyes:     General: No scleral icterus.    Pupils: Pupils are equal, round, and reactive to light.  Cardiovascular:     Rate and Rhythm: Normal rate.  Pulmonary:     Effort: Pulmonary effort is normal. No respiratory distress.  Abdominal:     General: There is no distension.  Palpations: Abdomen is soft.     Tenderness: There is no abdominal tenderness.  Musculoskeletal:        General: Normal range of motion.     Cervical back: Normal range of motion and neck supple.     Left lower leg: Edema present.  Skin:    General: Skin is warm and dry.  Neurological:     Mental Status: He is alert and oriented to person, place, and time. Mental status is at baseline.     Cranial Nerves: No cranial nerve deficit.     Motor: No abnormal muscle tone.  Psychiatric:        Mood and Affect: Mood and affect normal.      LABORATORY DATA:  I have reviewed the data as listed    Latest Ref Rng & Units 04/15/2023    8:48 AM  04/01/2023    9:05 AM 03/18/2023    8:39 AM  CBC  WBC 4.0 - 10.5 K/uL 8.0  4.7  5.7   Hemoglobin 13.0 - 17.0 g/dL 88.0  88.7  88.5   Hematocrit 39.0 - 52.0 % 38.4  35.2  35.8   Platelets 150 - 400 K/uL 202  165  160       Latest Ref Rng & Units 04/15/2023    8:48 AM 04/01/2023    9:05 AM 03/18/2023    8:39 AM  CMP  Glucose 70 - 99 mg/dL 892  845  859   BUN 6 - 20 mg/dL 38  28  28   Creatinine 0.61 - 1.24 mg/dL 7.78  8.55  8.58   Sodium 135 - 145 mmol/L 139  138  139   Potassium 3.5 - 5.1 mmol/L 3.6  3.9  3.8   Chloride 98 - 111 mmol/L 108  107  108   CO2 22 - 32 mmol/L 20  23  22    Calcium  8.9 - 10.3 mg/dL 8.5  8.2  8.5   Total Protein 6.5 - 8.1 g/dL 6.9  6.8  6.9   Total Bilirubin 0.0 - 1.2 mg/dL 1.0  0.6  0.7   Alkaline Phos 38 - 126 U/L 112  109  107   AST 15 - 41 U/L 18  23  28    ALT 0 - 44 U/L 11  17  19       RADIOGRAPHIC STUDIES: I have personally reviewed the radiological images as listed and agreed with the findings in the report. CT CHEST ABDOMEN PELVIS WO CONTRAST Result Date: 04/15/2023 CLINICAL DATA:  Restaging gastric cancer EXAM: CT CHEST, ABDOMEN AND PELVIS WITHOUT CONTRAST TECHNIQUE: Multidetector CT imaging of the chest, abdomen and pelvis was performed following the standard protocol without IV contrast. RADIATION DOSE REDUCTION: This exam was performed according to the departmental dose-optimization program which includes automated exposure control, adjustment of the mA and/or kV according to patient size and/or use of iterative reconstruction technique. COMPARISON:  PET-CT dated 12/31/2022 FINDINGS: CT CHEST FINDINGS Cardiovascular: Mild cardiomegaly.  Small pericardial effusion, new. No evidence of thoracic aortic aneurysm. Atherosclerotic calcifications of the aortic arch. Mild coronary atherosclerosis of the LAD and right coronary artery. Right chest port terminates at the cavoatrial junction. Mediastinum/Nodes: Dominant 12 mm short axis AP window node,  similar. However, additional mediastinal nodes are favored to be mildly progressive, including a 2 mm short axis low right paratracheal node (series 2/image 23) and new prevascular nodes measuring up to 7 mm short axis (series 2/image 21). Visualized thyroid  is unremarkable. Lungs/Pleura: 5  mm triangular subpleural nodule in the right lower lobe (series 4/image 109), unchanged, likely benign. Additional subpleural nodularity in the right upper and right middle lobes, measuring up to 5 mm, benign. No new/suspicious pulmonary nodules. Mild centrilobular emphysematous changes, upper lung predominant. Moderate left and small to moderate right pleural effusions, new. Associated lingular and left lower lobe compressive atelectasis. No pneumothorax. Musculoskeletal: Degenerative changes of the lower thoracic spine. CT ABDOMEN PELVIS FINDINGS Hepatobiliary: Unenhanced liver is notable for a mildly macronodular contour but no focal hepatic lesion. Cholelithiasis, without associated inflammatory changes. No intrahepatic or extrahepatic duct dilatation. Pancreas: Within normal limits. Spleen: Within normal limits. Adrenals/Urinary Tract: Adrenal glands are within normal limits. Kidneys are within normal limits. Renal vascular calcifications. Mild left hydronephrosis with narrowing at the UVJ. Thick-walled bladder, although underdistended. 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 No evidence of bowel obstruction. Appendix is not discretely visualized. Sigmoid diverticulosis, without evidence of diverticulitis. Large complex midline lower abdominal ventral hernia containing multiple loops of nondilated bowel (series 2/image 106). Vascular/Lymphatic: No evidence of abdominal aortic aneurysm. Atherosclerotic calcifications of the abdominal aorta and branch vessels, although vessels remain patent.  Perigastric soft tissue implant measuring 3.2 x 2.3 cm (series 2/image 53), previously 3.7 x 2.1 cm, favored to be mildly improved. Additional small periaortic/retroperitoneal nodes measuring up to 7 mm short axis (series 2/image 89), grossly unchanged. Reproductive: Prostate is unremarkable. Other: Trace pelvic ascites, new. Musculoskeletal: Degenerative changes of the lumbar spine. 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 Mediastinal, perigastric, and retroperitoneal nodal metastases, overall grossly unchanged, although possibly mildly progressive in the chest. Moderate left and small to moderate right pleural effusions, new. Small pericardial effusion, new. Trace pelvic ascites, new. Additional stable ancillary findings as above. Aortic Atherosclerosis (ICD10-I70.0) and Emphysema (ICD10-J43.9). Electronically Signed   By: Pinkie Pebbles M.D.   On: 04/15/2023 00:24

## 2023-04-15 NOTE — Progress Notes (Signed)
 Patients creatinine 2.21 today. Per pt he had not been eating and drinking yesterday. Babara, MD notified and ordered 1 L NS over an hour prior to treatment. Per Babara, MD ok to proceed with treatment with creatinine elevated. Patient received fluids and leucovorin . At 1200 pt reports increasing headache that is a 6/10 on the pain scale. He said the headache started around 9 this morning. Babara, MD notified and ordered tylenol . Tylenol  given at 1222. BP at 1240 was 147/95. Pt stated his headache had started to improve. Pt educated to monitor bp at home and to notify cancer center with any questions or concerns. Pt also educated to seek emergency care if he continued to have high blood pressure or symptoms of high blood pressure. Pt and sister verified understanding. Pt stable at discharge.

## 2023-04-15 NOTE — Assessment & Plan Note (Signed)
Continue Eliquis 2.5mg BID.  

## 2023-04-15 NOTE — Assessment & Plan Note (Signed)
 Stable symptoms. He will defer gabapentin for now.

## 2023-04-15 NOTE — Assessment & Plan Note (Signed)
 Chemotherapy plan as listed above

## 2023-04-16 LAB — CEA: CEA: 6.9 ng/mL — ABNORMAL HIGH (ref 0.0–4.7)

## 2023-04-17 ENCOUNTER — Inpatient Hospital Stay: Payer: Medicaid Other

## 2023-04-17 DIAGNOSIS — C160 Malignant neoplasm of cardia: Secondary | ICD-10-CM

## 2023-04-17 DIAGNOSIS — Z5111 Encounter for antineoplastic chemotherapy: Secondary | ICD-10-CM | POA: Diagnosis not present

## 2023-04-17 MED ORDER — SODIUM CHLORIDE 0.9% FLUSH
10.0000 mL | INTRAVENOUS | Status: DC | PRN
  Administered 2023-04-17: 10 mL
  Filled 2023-04-17: qty 10

## 2023-04-17 MED ORDER — HEPARIN SOD (PORK) LOCK FLUSH 100 UNIT/ML IV SOLN
500.0000 [IU] | Freq: Once | INTRAVENOUS | Status: AC | PRN
  Administered 2023-04-17: 500 [IU]
  Filled 2023-04-17: qty 5

## 2023-04-22 ENCOUNTER — Encounter: Payer: Self-pay | Admitting: Oncology

## 2023-04-23 ENCOUNTER — Encounter: Payer: Self-pay | Admitting: Emergency Medicine

## 2023-04-23 ENCOUNTER — Ambulatory Visit: Payer: Medicaid Other | Admitting: Physician Assistant

## 2023-04-23 ENCOUNTER — Emergency Department: Payer: Medicaid Other

## 2023-04-23 ENCOUNTER — Inpatient Hospital Stay
Admission: EM | Admit: 2023-04-23 | Discharge: 2023-04-28 | DRG: 291 | Disposition: A | Discharge status: Home Health | Payer: Medicaid Other | Attending: Internal Medicine | Admitting: Internal Medicine

## 2023-04-23 ENCOUNTER — Other Ambulatory Visit: Payer: Self-pay

## 2023-04-23 DIAGNOSIS — I160 Hypertensive urgency: Secondary | ICD-10-CM | POA: Diagnosis present

## 2023-04-23 DIAGNOSIS — E669 Obesity, unspecified: Secondary | ICD-10-CM | POA: Diagnosis not present

## 2023-04-23 DIAGNOSIS — I3139 Other pericardial effusion (noninflammatory): Secondary | ICD-10-CM | POA: Diagnosis present

## 2023-04-23 DIAGNOSIS — C160 Malignant neoplasm of cardia: Secondary | ICD-10-CM | POA: Diagnosis not present

## 2023-04-23 DIAGNOSIS — I5032 Chronic diastolic (congestive) heart failure: Secondary | ICD-10-CM

## 2023-04-23 DIAGNOSIS — E1129 Type 2 diabetes mellitus with other diabetic kidney complication: Secondary | ICD-10-CM | POA: Diagnosis present

## 2023-04-23 DIAGNOSIS — D849 Immunodeficiency, unspecified: Secondary | ICD-10-CM | POA: Diagnosis present

## 2023-04-23 DIAGNOSIS — I130 Hypertensive heart and chronic kidney disease with heart failure and stage 1 through stage 4 chronic kidney disease, or unspecified chronic kidney disease: Principal | ICD-10-CM | POA: Diagnosis present

## 2023-04-23 DIAGNOSIS — Z7984 Long term (current) use of oral hypoglycemic drugs: Secondary | ICD-10-CM

## 2023-04-23 DIAGNOSIS — I5023 Acute on chronic systolic (congestive) heart failure: Secondary | ICD-10-CM | POA: Diagnosis present

## 2023-04-23 DIAGNOSIS — Z79899 Other long term (current) drug therapy: Secondary | ICD-10-CM

## 2023-04-23 DIAGNOSIS — Z8349 Family history of other endocrine, nutritional and metabolic diseases: Secondary | ICD-10-CM

## 2023-04-23 DIAGNOSIS — R0602 Shortness of breath: Secondary | ICD-10-CM | POA: Diagnosis not present

## 2023-04-23 DIAGNOSIS — Z8261 Family history of arthritis: Secondary | ICD-10-CM

## 2023-04-23 DIAGNOSIS — F419 Anxiety disorder, unspecified: Secondary | ICD-10-CM | POA: Diagnosis present

## 2023-04-23 DIAGNOSIS — I10 Essential (primary) hypertension: Secondary | ICD-10-CM | POA: Diagnosis not present

## 2023-04-23 DIAGNOSIS — Z9109 Other allergy status, other than to drugs and biological substances: Secondary | ICD-10-CM

## 2023-04-23 DIAGNOSIS — I4821 Permanent atrial fibrillation: Secondary | ICD-10-CM | POA: Diagnosis present

## 2023-04-23 DIAGNOSIS — I420 Dilated cardiomyopathy: Secondary | ICD-10-CM | POA: Diagnosis present

## 2023-04-23 DIAGNOSIS — I480 Paroxysmal atrial fibrillation: Secondary | ICD-10-CM

## 2023-04-23 DIAGNOSIS — E785 Hyperlipidemia, unspecified: Secondary | ICD-10-CM

## 2023-04-23 DIAGNOSIS — I5043 Acute on chronic combined systolic (congestive) and diastolic (congestive) heart failure: Secondary | ICD-10-CM | POA: Diagnosis present

## 2023-04-23 DIAGNOSIS — E1122 Type 2 diabetes mellitus with diabetic chronic kidney disease: Secondary | ICD-10-CM | POA: Diagnosis present

## 2023-04-23 DIAGNOSIS — Z8673 Personal history of transient ischemic attack (TIA), and cerebral infarction without residual deficits: Secondary | ICD-10-CM

## 2023-04-23 DIAGNOSIS — J189 Pneumonia, unspecified organism: Principal | ICD-10-CM | POA: Diagnosis present

## 2023-04-23 DIAGNOSIS — F418 Other specified anxiety disorders: Secondary | ICD-10-CM | POA: Diagnosis not present

## 2023-04-23 DIAGNOSIS — I69354 Hemiplegia and hemiparesis following cerebral infarction affecting left non-dominant side: Secondary | ICD-10-CM

## 2023-04-23 DIAGNOSIS — Z882 Allergy status to sulfonamides status: Secondary | ICD-10-CM

## 2023-04-23 DIAGNOSIS — Z91148 Patient's other noncompliance with medication regimen for other reason: Secondary | ICD-10-CM

## 2023-04-23 DIAGNOSIS — Z833 Family history of diabetes mellitus: Secondary | ICD-10-CM

## 2023-04-23 DIAGNOSIS — Z822 Family history of deafness and hearing loss: Secondary | ICD-10-CM

## 2023-04-23 DIAGNOSIS — I951 Orthostatic hypotension: Secondary | ICD-10-CM | POA: Diagnosis present

## 2023-04-23 DIAGNOSIS — I5033 Acute on chronic diastolic (congestive) heart failure: Secondary | ICD-10-CM | POA: Diagnosis present

## 2023-04-23 DIAGNOSIS — N1832 Chronic kidney disease, stage 3b: Secondary | ICD-10-CM | POA: Diagnosis not present

## 2023-04-23 DIAGNOSIS — Z823 Family history of stroke: Secondary | ICD-10-CM

## 2023-04-23 DIAGNOSIS — F32A Depression, unspecified: Secondary | ICD-10-CM | POA: Diagnosis present

## 2023-04-23 DIAGNOSIS — Z808 Family history of malignant neoplasm of other organs or systems: Secondary | ICD-10-CM

## 2023-04-23 DIAGNOSIS — Z8249 Family history of ischemic heart disease and other diseases of the circulatory system: Secondary | ICD-10-CM

## 2023-04-23 DIAGNOSIS — I051 Rheumatic mitral insufficiency: Secondary | ICD-10-CM | POA: Diagnosis present

## 2023-04-23 DIAGNOSIS — Z6834 Body mass index (BMI) 34.0-34.9, adult: Secondary | ICD-10-CM

## 2023-04-23 DIAGNOSIS — E7800 Pure hypercholesterolemia, unspecified: Secondary | ICD-10-CM | POA: Diagnosis present

## 2023-04-23 DIAGNOSIS — I878 Other specified disorders of veins: Secondary | ICD-10-CM | POA: Diagnosis present

## 2023-04-23 DIAGNOSIS — Z1152 Encounter for screening for COVID-19: Secondary | ICD-10-CM

## 2023-04-23 DIAGNOSIS — Z7901 Long term (current) use of anticoagulants: Secondary | ICD-10-CM

## 2023-04-23 DIAGNOSIS — Z818 Family history of other mental and behavioral disorders: Secondary | ICD-10-CM

## 2023-04-23 LAB — CBC
HCT: 38.8 % — ABNORMAL LOW (ref 39.0–52.0)
Hemoglobin: 12.4 g/dL — ABNORMAL LOW (ref 13.0–17.0)
MCH: 28.2 pg (ref 26.0–34.0)
MCHC: 32 g/dL (ref 30.0–36.0)
MCV: 88.2 fL (ref 80.0–100.0)
Platelets: 209 10*3/uL (ref 150–400)
RBC: 4.4 MIL/uL (ref 4.22–5.81)
RDW: 16.2 % — ABNORMAL HIGH (ref 11.5–15.5)
WBC: 6 10*3/uL (ref 4.0–10.5)
nRBC: 0.3 % — ABNORMAL HIGH (ref 0.0–0.2)

## 2023-04-23 LAB — BRAIN NATRIURETIC PEPTIDE: B Natriuretic Peptide: 136 pg/mL — ABNORMAL HIGH (ref 0.0–100.0)

## 2023-04-23 LAB — BASIC METABOLIC PANEL
Anion gap: 9 (ref 5–15)
BUN: 53 mg/dL — ABNORMAL HIGH (ref 6–20)
CO2: 18 mmol/L — ABNORMAL LOW (ref 22–32)
Calcium: 8.6 mg/dL — ABNORMAL LOW (ref 8.9–10.3)
Chloride: 108 mmol/L (ref 98–111)
Creatinine, Ser: 1.97 mg/dL — ABNORMAL HIGH (ref 0.61–1.24)
GFR, Estimated: 40 mL/min — ABNORMAL LOW (ref >60)
Glucose, Bld: 140 mg/dL — ABNORMAL HIGH (ref 70–99)
Potassium: 4 mmol/L (ref 3.5–5.1)
Sodium: 135 mmol/L (ref 135–145)

## 2023-04-23 LAB — RESP PANEL BY RT-PCR (RSV, FLU A&B, COVID)  RVPGX2
Influenza A by PCR: NEGATIVE
Influenza B by PCR: NEGATIVE
Resp Syncytial Virus by PCR: NEGATIVE
SARS Coronavirus 2 by RT PCR: NEGATIVE

## 2023-04-23 LAB — CBG MONITORING, ED: Glucose-Capillary: 121 mg/dL — ABNORMAL HIGH (ref 70–99)

## 2023-04-23 LAB — PROCALCITONIN: Procalcitonin: <0.1 ng/mL

## 2023-04-23 MED ORDER — APIXABAN 2.5 MG PO TABS
2.5000 mg | ORAL_TABLET | Freq: Two times a day (BID) | ORAL | Status: DC
  Administered 2023-04-24 (×2): 2.5 mg via ORAL
  Filled 2023-04-23 (×3): qty 1

## 2023-04-23 MED ORDER — DM-GUAIFENESIN ER 30-600 MG PO TB12: 1.0000 | ORAL_TABLET | Freq: Two times a day (BID) | ORAL | Status: DC | PRN

## 2023-04-23 MED ORDER — ALBUTEROL SULFATE (2.5 MG/3ML) 0.083% IN NEBU: 2.5000 mg | INHALATION_SOLUTION | RESPIRATORY_TRACT | Status: DC | PRN

## 2023-04-23 MED ORDER — SODIUM CHLORIDE 0.9 % IV SOLN
2.0000 g | Freq: Once | INTRAVENOUS | Status: AC
  Administered 2023-04-23: 2 g via INTRAVENOUS
  Filled 2023-04-23: qty 20

## 2023-04-23 MED ORDER — OXYCODONE HCL 5 MG PO TABS: 5.0000 mg | ORAL_TABLET | Freq: Four times a day (QID) | ORAL | Status: DC | PRN

## 2023-04-23 MED ORDER — ACETAMINOPHEN 325 MG PO TABS
650.0000 mg | ORAL_TABLET | Freq: Four times a day (QID) | ORAL | Status: DC | PRN
  Administered 2023-04-24 – 2023-04-27 (×2): 650 mg via ORAL
  Filled 2023-04-23 (×2): qty 2

## 2023-04-23 MED ORDER — ALBUTEROL SULFATE HFA 108 (90 BASE) MCG/ACT IN AERS: 2.0000 | INHALATION_SPRAY | RESPIRATORY_TRACT | Status: DC | PRN

## 2023-04-23 MED ORDER — ONDANSETRON HCL 4 MG/2ML IJ SOLN: 4.0000 mg | Freq: Three times a day (TID) | INTRAMUSCULAR | Status: DC | PRN

## 2023-04-23 MED ORDER — BUSPIRONE HCL 15 MG PO TABS
7.5000 mg | ORAL_TABLET | Freq: Two times a day (BID) | ORAL | Status: DC
  Administered 2023-04-23 – 2023-04-28 (×10): 7.5 mg via ORAL
  Filled 2023-04-23 (×4): qty 1
  Filled 2023-04-23: qty 2
  Filled 2023-04-23 (×3): qty 1
  Filled 2023-04-23: qty 2
  Filled 2023-04-23 (×2): qty 1

## 2023-04-23 MED ORDER — SODIUM CHLORIDE 0.9 % IV SOLN
500.0000 mg | Freq: Once | INTRAVENOUS | Status: AC
  Administered 2023-04-23: 500 mg via INTRAVENOUS
  Filled 2023-04-23: qty 5

## 2023-04-23 MED ORDER — INSULIN ASPART 100 UNIT/ML IJ SOLN: 0.0000 [IU] | Freq: Every day | INTRAMUSCULAR | Status: DC

## 2023-04-23 MED ORDER — HYDRALAZINE HCL 20 MG/ML IJ SOLN
5.0000 mg | INTRAMUSCULAR | Status: DC | PRN
  Administered 2023-04-24: 5 mg via INTRAVENOUS
  Filled 2023-04-23: qty 1

## 2023-04-23 MED ORDER — INSULIN ASPART 100 UNIT/ML IJ SOLN
0.0000 [IU] | Freq: Three times a day (TID) | INTRAMUSCULAR | Status: DC
  Administered 2023-04-24: 1 [IU] via SUBCUTANEOUS
  Administered 2023-04-24 – 2023-04-25 (×2): 2 [IU] via SUBCUTANEOUS
  Administered 2023-04-25: 3 [IU] via SUBCUTANEOUS
  Administered 2023-04-27: 2 [IU] via SUBCUTANEOUS
  Administered 2023-04-27: 1 [IU] via SUBCUTANEOUS
  Administered 2023-04-28: 2 [IU] via SUBCUTANEOUS
  Filled 2023-04-23 (×7): qty 1

## 2023-04-23 NOTE — ED Provider Triage Note (Signed)
Emergency Medicine Provider Triage Evaluation Note  Jeremiah Vance , a 53 y.o. male  was evaluated in triage.  Pt complains of shortness of breath, headache, dizziness, and poor appetite x 6 days.  Physical Exam  BP (!) 172/125   Pulse 91   Temp 97.6 F (36.4 C) (Oral)   Resp 20   Ht 5\' 11"  (1.803 m)   Wt 111.1 kg   SpO2 94%   BMI 34.17 kg/m  Gen:   Awake, no distress   Resp:  Normal effort  MSK:   Moves extremities without difficulty  Other:    Medical Decision Making  Medically screening exam initiated at 5:12 PM.  Appropriate orders placed.  Jeremiah Vance was informed that the remainder of the evaluation will be completed by another provider, this initial triage assessment does not replace that evaluation, and the importance of remaining in the ED until their evaluation is complete.    Chinita Pester, FNP 04/23/23 [phone]

## 2023-04-23 NOTE — ED Provider Notes (Signed)
[facility] Emergency Department Provider Note     Event Date/Time   First MD Initiated Contact with Patient 04/23/23 1939     (approximate)   History   Headache   HPI  Jeremiah Vance is a 53 y.o. male with a history of CKD, HTN, CHF, DM, A-fib on Eliquis, obesity, and GE junction carcinoma with distant mets, presents to the ED for evaluation of headache, cough, congestion, shortness of breath, and dizziness.  Patient with onset of symptoms since Thursday, nearly 1 week ago.  He would also endorse fatigue and decreased p.o. intake.  Patient denies any frank fevers, nausea, vomiting, dizziness.  Physical Exam   Triage Vital Signs: ED Triage Vitals [04/23/23 1710]  Encounter Vitals Group     BP (!) 172/125     Systolic BP Percentile      Diastolic BP Percentile      Pulse Rate 91     Resp 20     Temp 97.6 F (36.4 C)     Temp Source Oral     SpO2 94 %     Weight 245 lb (111.1 kg)     Height 5\' 11"  (1.803 m)     Head Circumference      Peak Flow      Pain Score 6     Pain Loc      Pain Education      Exclude from Growth Chart     Most recent vital signs: Vitals:   04/23/23 1710  BP: (!) 172/125  Pulse: 91  Resp: 20  Temp: 97.6 F (36.4 C)  SpO2: 94%    General Awake, no distress. NAD HEENT NCAT. PERRL. EOMI. No rhinorrhea. Mucous membranes are moist.  CV:  Good peripheral perfusion. RRR. No pitting, cyanosis, or edema above baseline noted RESP:  Normal effort. CTA ABD:  No distention. Soft, stable ventral hernia   ED Results / Procedures / Treatments   Labs (all labs ordered are listed, but only abnormal results are displayed) Labs Reviewed  BASIC METABOLIC PANEL - Abnormal; Notable for the following components:      Result Value   CO2 18 (*)    Glucose, Bld 140 (*)    BUN 53 (*)    Creatinine, Ser 1.97 (*)    Calcium 8.6 (*)    GFR, Estimated 40 (*)    All other components within normal limits  CBC - Abnormal;  Notable for the following components:   Hemoglobin 12.4 (*)    HCT 38.8 (*)    RDW 16.2 (*)    nRBC 0.3 (*)    All other components within normal limits  RESP PANEL BY RT-PCR (RSV, FLU A&B, COVID)  RVPGX2  CULTURE, BLOOD (ROUTINE X 2)  CULTURE, BLOOD (ROUTINE X 2)  EXPECTORATED SPUTUM ASSESSMENT W GRAM STAIN, RFLX TO RESP C  BRAIN NATRIURETIC PEPTIDE  BASIC METABOLIC PANEL  CBC  HIV ANTIBODY (ROUTINE TESTING W REFLEX)  PROCALCITONIN     EKG    RADIOLOGY  I personally viewed and evaluated these images as part of my medical decision making, as well as reviewing the written report by the radiologist.  ED Provider Interpretation: Bilateral opacities in the lower lobes concerning for pneumonia  DG Chest 2 View Result Date: 04/23/2023 CLINICAL DATA:  Congestion, shortness of breath.  COPD EXAM: CHEST - 2 VIEW COMPARISON:  10/27/2022 FINDINGS: Right Port-A-Cath in place with the tip at the cavoatrial junction. Cardiomegaly with vascular  congestion. Left pleural effusion with perihilar and lower lobe airspace opacities, left greater than right. No acute bony abnormality. IMPRESSION: Cardiomegaly with vascular congestion and bilateral perihilar opacities and lower lobe opacities, left greater than right. Favor asymmetric edema. Cannot exclude pneumonia. Small to moderate left pleural effusion. Electronically Signed   By: Charlett Nose M.D.   On: 04/23/2023 17:40     PROCEDURES:  Critical Care performed: No  Procedures   MEDICATIONS ORDERED IN ED: Medications  azithromycin (ZITHROMAX) 500 mg in sodium chloride 0.9 % 250 mL IVPB (has no administration in time range)  cefTRIAXone (ROCEPHIN) 2 g in sodium chloride 0.9 % 100 mL IVPB (has no administration in time range)  insulin aspart (novoLOG) injection 0-9 Units (has no administration in time range)  insulin aspart (novoLOG) injection 0-5 Units (has no administration in time range)  dextromethorphan-guaiFENesin (MUCINEX DM) 30-600  MG per 12 hr tablet 1 tablet (has no administration in time range)  ondansetron (ZOFRAN) injection 4 mg (has no administration in time range)  hydrALAZINE (APRESOLINE) injection 5 mg (has no administration in time range)  acetaminophen (TYLENOL) tablet 650 mg (has no administration in time range)  albuterol (PROVENTIL) (2.5 MG/3ML) 0.083% nebulizer solution 2.5 mg (has no administration in time range)  oxyCODONE (Oxy IR/ROXICODONE) immediate release tablet 5 mg (has no administration in time range)  busPIRone (BUSPAR) tablet 7.5 mg (has no administration in time range)  apixaban (ELIQUIS) tablet 2.5 mg (has no administration in time range)     IMPRESSION / MDM / ASSESSMENT AND PLAN / ED COURSE  I reviewed the triage vital signs and the nursing notes.                              Differential diagnosis includes, but is not limited to, COVID, flu, RSV, viral URI, CAP, pneumonia  Patient's presentation is most consistent with acute complicated illness / injury requiring diagnostic workup.  Patient's diagnosis is consistent with CAP.  No acute lab abnormalities are noted.  Slight bump in the patient creatinine from base.  No leukocytosis or critical anemia is appreciated.  Viral panel test is negative at this time.  Given patient's clinical presentation, immunocompromised state, and CXR findings concerning for early pneumonia, admission was offered.  Patient along with his family member at bedside, are agreeable to the plan.  Patient will be started empirically on Rocephin and azithromycin via IV.  Blood cultures will be obtained at this time.  FINAL CLINICAL IMPRESSION(S) / ED DIAGNOSES   Final diagnoses:  Community acquired pneumonia, unspecified laterality     Rx / DC Orders   ED Discharge Orders     None        Note:  This document was prepared using Dragon voice recognition software and may include unintentional dictation errors.    Lissa Hoard, PA-C 04/23/23  [id number]    Jene Every, MD 04/23/23 2226

## 2023-04-23 NOTE — H&P (Signed)
History and Physical    Jeremiah Vance ZOX:[id number] DOB: 1970/11/02 DOA: 04/23/2023  Referring MD/NP/PA:   PCP: Debera Lat, PA-C   Patient coming from:  The patient is coming from home.     Chief Complaint: SOB  HPI: Jeremiah Vance is a 53 y.o. male with medical history significant of GE junction carcinoma on chemotherapy, HTN, HLD, DM, dCHF,  stroke with mild left sided weakness, depression with anxiety, A-fib on Eliquis, obesity, who presents with SOB  Pt states that he has shortness of breath for more than 1 week, which has been progressively worsening.  Patient has dry cough, no chest pain, no fever or chills.  Patient has poor appetite and decreased oral intake.  Has dizziness, generalized weakness and headache.  No nausea, vomiting, diarrhea or abdominal pain.  No symptoms of UTI.   Data reviewed independently and ED Course: pt was found to have BNP 136, stable renal function, WBC 6.0, negative PCR for COVID, flu and RSV, procalcitonin <0.10, temperature normal, blood pressure 172/125, heart rate 91, RR 20, oxygen saturation 94% on room air.  Patient is placed on telemetry bed for observation.  Chest x-ray: Cardiomegaly with vascular congestion and bilateral perihilar opacities and lower lobe opacities, left greater than right. Favor asymmetric edema. Cannot exclude pneumonia.   Small to moderate left pleural effusion.    EKG: I have personally reviewed.  A-fib, QTc 462, LAD, poor R wave progression   Review of Systems:   General: no fevers, chills, no body weight gain, has poor appetite, has fatigue HEENT: no blurry vision, hearing changes or sore throat Respiratory: has dyspnea, coughing, no wheezing CV: no chest pain, no palpitations GI: no nausea, vomiting, abdominal pain, diarrhea, constipation GU: no dysuria, burning on urination, increased urinary frequency, hematuria  Ext: has leg edema Neuro: no unilateral weakness, numbness, or tingling, no vision  change or hearing loss Skin: no rash, no skin tear. MSK: No muscle spasm, no deformity, no limitation of range of movement in spin Heme: No easy bruising.  Travel history: No recent long distant travel.   Allergy:  Allergies  Allergen Reactions   Pollen Extract Other (See Comments)   Sulfa Antibiotics     Past Medical History:  Diagnosis Date   Acute ischemic stroke (HCC) 09/06/2017   Acute renal failure superimposed on stage 3a chronic kidney disease (HCC) 07/08/2019   Acute right-sided weakness    Allergies    Anasarca    Anemia 07/10/2017   Arthritis    Cancer (HCC)    stomach and esophageal cancer stage 4   CHF (congestive heart failure) (HCC)    "coinsided w/kidney problems I was having 06/2017"   Chicken pox    CKD (chronic kidney disease) stage 3, GFR 30-59 ml/min (HCC) 06/2017   Depression    Elevated troponin 07/08/2019   High cholesterol    History of cardiomyopathy    LVEF 40 to 45% in April 2019 - subsequently normalized   Hyperbilirubinemia 07/10/2017   Hypertension    Ischemic stroke (HCC)    Small left internal capsule infarct due to lacunar disease   Morbid obesity (HCC)    Normocytic anemia 12/16/2017   Recurrent incisional hernia with incarceration s/p repair 10/22/2017 10/21/2017   Stroke (HCC) 08/2017   "right sided weakness since; getting stronger though" (10/22/2017)   Type 2 diabetes mellitus [facility])     Past Surgical History:  Procedure Laterality Date   ABDOMINAL HERNIA REPAIR  2008; 10/22/2017   "  scope; OPEN REPAIR INCARCERATED VENTRAL HERNIA   ESOPHAGOGASTRODUODENOSCOPY (EGD) WITH PROPOFOL N/A 09/10/2022   Procedure: ESOPHAGOGASTRODUODENOSCOPY (EGD) WITH PROPOFOL;  Surgeon: Toney Reil, MD;  Location: [facility] ENDOSCOPY;  Service: Gastroenterology;  Laterality: N/A;   ESOPHAGOGASTRODUODENOSCOPY (EGD) WITH PROPOFOL N/A 09/28/2022   Procedure: ESOPHAGOGASTRODUODENOSCOPY (EGD) WITH PROPOFOL;  Surgeon: Jaynie Collins, DO;  Location: [facility]  ENDOSCOPY;  Service: Gastroenterology;  Laterality: N/A;   HEMOSTASIS CONTROL  09/28/2022   Procedure: HEMOSTASIS CONTROL;  Surgeon: Jaynie Collins, DO;  Location: [facility] ENDOSCOPY;  Service: Gastroenterology;;   HERNIA REPAIR     KNEE ARTHROSCOPY Right 1989   PORTA CATH INSERTION N/A 09/25/2022   Procedure: PORTA CATH INSERTION;  Surgeon: Annice Needy, MD;  Location: [facility] INVASIVE CV LAB;  Service: Cardiovascular;  Laterality: N/A;   VENTRAL HERNIA REPAIR N/A 10/22/2017   Procedure: OPEN REPAIR INCARCERATED VENTRAL HERNIA;  Surgeon: Glenna Fellows, MD;  Location: [facility] OR;  Service: General;  Laterality: N/A;    Social History:  reports that he has never smoked. He has never used smokeless tobacco. He reports current alcohol use. He reports that he does not use drugs.  Family History:  Family History  Problem Relation Age of Onset   Diabetes Mother    Stroke Mother    Arthritis Mother    Depression Mother    Heart disease Mother    Hypertension Mother    Learning disabilities Mother    Mental illness Mother    Sleep apnea Mother    Diabetes Father    Heart disease Father    Arthritis Father    Hearing loss Father    Hyperlipidemia Father    Heart attack Father    Hypertension Father    Stroke Father    Diabetes Sister    Depression Sister    Diabetes Sister    Hypertension Sister    Mental illness Sister    Sleep apnea Sister    Diabetes Maternal Grandmother    Heart disease Maternal Grandmother    Depression Maternal Grandmother    Hyperlipidemia Maternal Grandmother    Hypertension Maternal Grandmother    Stroke Maternal Grandmother    Hypertension Maternal Grandfather    Hyperlipidemia Maternal Grandfather    Stroke Maternal Grandfather    Arthritis Paternal Grandmother    Hearing loss Paternal Grandmother    Stroke Paternal Grandfather    Heart disease Paternal Grandfather    Arthritis Paternal Grandfather    Heart attack Paternal Grandfather    Melanoma  Other      Prior to Admission medications   Medication Sig Start Date End Date Taking? Authorizing Provider  acetaminophen (TYLENOL) 325 MG tablet Take 2 tablets (650 mg total) by mouth every 4 (four) hours as needed for mild pain (or temp > 37.5 C (99.5 F)). 12/06/22   Setzer, Lynnell Jude, PA-C  atorvastatin (LIPITOR) 40 MG tablet Take 1 tablet (40 mg total) by mouth daily at 6 PM. 10/23/22   Ostwalt, Edmon Crape, PA-C  busPIRone (BUSPAR) 7.5 MG tablet Take 1 tablet (7.5 mg total) by mouth 2 (two) times daily. 02/03/23   Ostwalt, Edmon Crape, PA-C  Continuous Glucose Sensor (FREESTYLE LIBRE 2 SENSOR) MISC Place 1 sensor on the skin every 14 days. Use to check glucose continuously 10/23/22   Ostwalt, Edmon Crape, PA-C  Continuous Glucose Sensor (FREESTYLE LIBRE 3 SENSOR) MISC Place 1 sensor on the skin every 14 days. Use to check glucose continuously 02/20/23   Debera Lat, PA-C  dapagliflozin propanediol (FARXIGA) 10  MG TABS tablet Take 1 tablet (10 mg total) by mouth daily. 12/07/22   Setzer, Lynnell Jude, PA-C  dexamethasone (DECADRON) 4 MG tablet TAKE 2 TABLETS(8 MG) BY MOUTH DAILY. START THE DAY AFTER CHEMOTHERAPY FOR 2 DAYS. TAKE WITH FOOD 12/30/22   Rickard Patience, MD  diclofenac Sodium (VOLTAREN) 1 % GEL Apply 4 g topically 4 (four) times daily. Patient taking differently: Apply 4 g topically 4 (four) times daily as needed. 12/06/22   Setzer, Lynnell Jude, PA-C  ELIQUIS 2.5 MG TABS tablet TAKE 1 TABLET(2.5 MG) BY MOUTH TWICE DAILY 04/01/23   Rickard Patience, MD  hydrALAZINE (APRESOLINE) 25 MG tablet TAKE 1 TABLET(25 MG) BY MOUTH EVERY 8 HOURS 03/06/23   Ostwalt, Edmon Crape, PA-C  ondansetron (ZOFRAN-ODT) 8 MG disintegrating tablet Take 1 tablet (8 mg total) by mouth every 8 (eight) hours as needed for nausea or vomiting. Patient not taking: Reported on 04/15/2023 02/03/23   Rickard Patience, MD  oxyCODONE (OXY IR/ROXICODONE) 5 MG immediate release tablet Take 1 tablet (5 mg total) by mouth every 6 (six) hours as needed for severe pain (pain score 7-10).  02/04/23   Rickard Patience, MD  pantoprazole (PROTONIX) 40 MG tablet Take 1 tablet (40 mg total) by mouth 2 (two) times daily before a meal. 03/14/23   Rickard Patience, MD  prochlorperazine (COMPAZINE) 10 MG tablet Take 1 tablet (10 mg total) by mouth every 6 (six) hours as needed for nausea or vomiting. 10/10/22   Rickard Patience, MD  scopolamine (TRANSDERM-SCOP) 1 MG/3DAYS Place 1 patch (1.5 mg total) onto the skin every 3 (three) days. 09/29/22   Sunnie Nielsen, DO  sertraline (ZOLOFT) 25 MG tablet Take 1 tablet (25 mg total) by mouth daily. 02/20/23   Debera Lat, PA-C  sertraline (ZOLOFT) 50 MG tablet Take 1 tablet (50 mg total) by mouth daily. 02/03/23   Ostwalt, Edmon Crape, PA-C  torsemide (DEMADEX) 10 MG tablet Take 1 tablet (10 mg total) by mouth daily as needed (lower ext swelling). Patient not taking: Reported on 04/15/2023 12/06/22   Milinda Antis, PA-C    Physical Exam: Vitals:   04/23/23 1710 04/24/23 0000 04/24/23 0043 04/24/23 0100  BP: (!) 172/125 (!) 185/115 (!) 181/110 (!) 180/117  Pulse: 91 (!) 115 96 91  Resp: 20 (!) 37 (!) 41 (!) 35  Temp: 97.6 F (36.4 C)  (!) 97.1 F (36.2 C)   TempSrc: Oral  Oral   SpO2: 94% 94% 95% 94%  Weight: 111.1 kg     Height: 5\' 11"  (1.803 m)      General: Not in acute distress HEENT:       Eyes: PERRL, EOMI, no jaundice       ENT: No discharge from the ears and nose, no pharynx injection, no tonsillar enlargement.        Neck: positive JVD, no bruit, no mass felt. Heme: No neck lymph node enlargement. Cardiac: S1/S2, RRR, No murmurs, No gallops or rubs. Respiratory: has crackles bilaterally GI: Soft, nondistended, nontender, no rebound pain, no organomegaly, BS present. GU: No hematuria Ext: 1+ pitting leg edema bilaterally. 1+DP/PT pulse bilaterally. Musculoskeletal: No joint deformities, No joint redness or warmth, no limitation of ROM in spin. Skin: No rashes.  Neuro: Alert, oriented X3, cranial nerves II-XII grossly intact, moves all extremities  normally.  Psych: Patient is not psychotic, no suicidal or hemocidal ideation.  Labs on Admission: I have personally reviewed following labs and imaging studies  CBC: Recent Labs  Lab 04/23/23 1713  WBC  6.0  HGB 12.4*  HCT 38.8*  MCV 88.2  PLT 209   Basic Metabolic Panel: Recent Labs  Lab 04/23/23 1713  NA 135  K 4.0  CL 108  CO2 18*  GLUCOSE 140*  BUN 53*  CREATININE 1.97*  CALCIUM 8.6*   GFR: Estimated Creatinine Clearance: 55 mL/min (A) (by C-G formula based on SCr of 1.97 mg/dL (H)). Liver Function Tests: No results for input(s): "AST", "ALT", "ALKPHOS", "BILITOT", "PROT", "ALBUMIN" in the last 168 hours. No results for input(s): "LIPASE", "AMYLASE" in the last 168 hours. No results for input(s): "AMMONIA" in the last 168 hours. Coagulation Profile: No results for input(s): "INR", "PROTIME" in the last 168 hours. Cardiac Enzymes: No results for input(s): "CKTOTAL", "CKMB", "CKMBINDEX", "TROPONINI" in the last 168 hours. BNP (last 3 results) No results for input(s): "PROBNP" in the last 8760 hours. HbA1C: No results for input(s): "HGBA1C" in the last 72 hours. CBG: Recent Labs  Lab 04/23/23 2220  GLUCAP 121*   Lipid Profile: No results for input(s): "CHOL", "HDL", "LDLCALC", "TRIG", "CHOLHDL", "LDLDIRECT" in the last 72 hours. Thyroid Function Tests: No results for input(s): "TSH", "T4TOTAL", "FREET4", "T3FREE", "THYROIDAB" in the last 72 hours. Anemia Panel: No results for input(s): "VITAMINB12", "FOLATE", "FERRITIN", "TIBC", "IRON", "RETICCTPCT" in the last 72 hours. Urine analysis:    Component Value Date/Time   COLORURINE STRAW (A) 09/27/2022 1100   APPEARANCEUR CLEAR (A) 09/27/2022 1100   LABSPEC 1.006 09/27/2022 1100   PHURINE 6.0 09/27/2022 1100   GLUCOSEU NEGATIVE 09/27/2022 1100   HGBUR NEGATIVE 09/27/2022 1100   BILIRUBINUR NEGATIVE 09/27/2022 1100   BILIRUBINUR negative 05/19/2020 1618   KETONESUR NEGATIVE 09/27/2022 1100   PROTEINUR  NEGATIVE 09/27/2022 1100   UROBILINOGEN 0.2 05/19/2020 1618   UROBILINOGEN 2.0 (H) 10/21/2017 1102   NITRITE NEGATIVE 09/27/2022 1100   LEUKOCYTESUR TRACE (A) 09/27/2022 1100   Sepsis Labs: @LABRCNTIP (procalcitonin:4,lacticidven:4) ) Recent Results (from the past 240 hours)  Resp panel by RT-PCR (RSV, Flu A&B, Covid) Anterior Nasal Swab     Status: None   Collection Time: 04/23/23  5:13 PM   Specimen: Anterior Nasal Swab  Result Value Ref Range Status   SARS Coronavirus 2 by RT PCR NEGATIVE NEGATIVE Final    Comment: (NOTE) SARS-CoV-2 target nucleic acids are NOT DETECTED.  The SARS-CoV-2 RNA is generally detectable in upper respiratory specimens during the acute phase of infection. The lowest concentration of SARS-CoV-2 viral copies this assay can detect is 138 copies/mL. A negative result does not preclude SARS-Cov-2 infection and should not be used as the sole basis for treatment or other patient management decisions. A negative result may occur with  improper specimen collection/handling, submission of specimen other than nasopharyngeal swab, presence of viral mutation(s) within the areas targeted by this assay, and inadequate number of viral copies(<138 copies/mL). A negative result must be combined with clinical observations, patient history, and epidemiological information. The expected result is Negative.  Fact Sheet for Patients:  BloggerCourse.com  Fact Sheet for Healthcare Providers:  SeriousBroker.it  This test is no t yet approved or cleared by the [place] FDA and  has been authorized for detection and/or diagnosis of SARS-CoV-2 by FDA under an Emergency Use Authorization (EUA). This EUA will remain  in effect (meaning this test can be used) for the duration of the COVID-19 declaration under Section 564(b)(1) of the Act, 21 U.S.C.section 360bbb-3(b)(1), unless the authorization is terminated  or revoked  sooner.       Influenza A by PCR  NEGATIVE NEGATIVE Final   Influenza B by PCR NEGATIVE NEGATIVE Final    Comment: (NOTE) The Xpert Xpress SARS-CoV-2/FLU/RSV plus assay is intended as an aid in the diagnosis of influenza from Nasopharyngeal swab specimens and should not be used as a sole basis for treatment. Nasal washings and aspirates are unacceptable for Xpert Xpress SARS-CoV-2/FLU/RSV testing.  Fact Sheet for Patients: BloggerCourse.com  Fact Sheet for Healthcare Providers: SeriousBroker.it  This test is not yet approved or cleared by the [place] FDA and has been authorized for detection and/or diagnosis of SARS-CoV-2 by FDA under an Emergency Use Authorization (EUA). This EUA will remain in effect (meaning this test can be used) for the duration of the COVID-19 declaration under Section 564(b)(1) of the Act, 21 U.S.C. section 360bbb-3(b)(1), unless the authorization is terminated or revoked.     Resp Syncytial Virus by PCR NEGATIVE NEGATIVE Final    Comment: (NOTE) Fact Sheet for Patients: BloggerCourse.com  Fact Sheet for Healthcare Providers: SeriousBroker.it  This test is not yet approved or cleared by the [place] FDA and has been authorized for detection and/or diagnosis of SARS-CoV-2 by FDA under an Emergency Use Authorization (EUA). This EUA will remain in effect (meaning this test can be used) for the duration of the COVID-19 declaration under Section 564(b)(1) of the Act, 21 U.S.C. section 360bbb-3(b)(1), unless the authorization is terminated or revoked.  Performed at [facility], [address], [place], [place] [address]      Radiological Exams on Admission:   Assessment/Plan Principal Problem:   Acute on chronic diastolic CHF (congestive heart failure) (HCC) Active Problems:   History of stroke   Paroxysmal atrial  fibrillation (HCC)   GE junction carcinoma (HCC)   Hyperlipidemia   Essential hypertension   Chronic kidney disease, stage 3b (HCC)   Type II diabetes mellitus with renal manifestations (HCC)   Depression with anxiety   Obesity (BMI 30-39.9)   Assessment and Plan:  Acute on chronic diastolic CHF (congestive heart failure) [facility]): Patient has 1+ leg edema, elevated BNP 136, vascular congestion by chest x-ray, crackles on auscultation, clinically consistent with CHF exacerbation. 2D echo 11/22/2022 showed EF of 55 to 60%.  Patient received 1 dose of Rocephin and azithromycin in ED due to concerning for possible pneumonia, however patient does not have fever or leukocytosis.  Procalcitonin less than 0.10.  Clinically does not seem to have pneumonia.  Will not continue antibiotics.  Clinically patient seems to have CHF.  -Place in tele bed for observation -Lasix 40 mg bid by IV -Daily weights -strict I/O's -Low salt diet -Fluid restriction -As needed bronchodilators for shortness of breath  History of stroke -Lipitor and Eliquis  Paroxysmal atrial fibrillation (HCC): Heart rate 90s -Eliquis  GE junction carcinoma [facility]): Patient is receiving chemotherapy, last treatment was about 10 days ago. -Follow-up with Dr. Cathie Hoops  Hyperlipidemia -Lipitor  Essential hypertension -Oral hydralazine 25 mg 3 times daily -IV hydralazine as needed  Chronic kidney disease, stage 3b [facility]): Renal function stable.  Baseline creatinine 2.21 on 04/15/2023.  His creatinine is 1. 97, BUN 53, GFR 40 today. -Follow-up with BMP  Type II diabetes mellitus with renal manifestations [facility]): Recent A1c 6.5, well-controlled.  Patient is taking [place]. -Sliding scale insulin  Depression with anxiety -Continue home medications  Obesity (BMI 30-39.9): Body weight 111.1 kg, BMI 34.17 -Encourage losing weight -Exercise and healthy diet      DVT ppx: on Eliquis  Code Status: Full code  Family  Communication: Yes, patient's sister at bed side.       by phone  Disposition Plan:  Anticipate discharge back to previous environment  Consults called:  none  Admission status and Level of care: Telemetry Cardiac:    for obs     Dispo: The patient is from: Home              Anticipated d/c is to: Home              Anticipated d/c date is: 1 day              Patient currently is not medically stable to d/c.    Severity of Illness:  The appropriate patient status for this patient is OBSERVATION. Observation status is judged to be reasonable and necessary in order to provide the required intensity of service to ensure the patient's safety. The patient's presenting symptoms, physical exam findings, and initial radiographic and laboratory data in the context of their medical condition is felt to place them at decreased risk for further clinical deterioration. Furthermore, it is anticipated that the patient will be medically stable for discharge from the hospital within 2 midnights of admission.        Date of Service 04/24/2023    Lorretta Harp [facility]   If 7PM-7AM, please contact night-coverage [email or web address] 04/24/2023, 1:36 AM

## 2023-04-23 NOTE — ED Triage Notes (Signed)
Patient to ED via POV for headache, congestion, SOB, and dizziness since Thursday. Pt has not been eating or drinking like normal at home.

## 2023-04-24 ENCOUNTER — Observation Stay (HOSPITAL_COMMUNITY)
Admit: 2023-04-24 | Discharge: 2023-04-24 | Disposition: A | Discharge status: Home / Self Care | Payer: Medicaid Other | Attending: Internal Medicine | Admitting: Internal Medicine

## 2023-04-24 ENCOUNTER — Encounter: Payer: Self-pay | Admitting: Internal Medicine

## 2023-04-24 DIAGNOSIS — Z8673 Personal history of transient ischemic attack (TIA), and cerebral infarction without residual deficits: Secondary | ICD-10-CM | POA: Diagnosis not present

## 2023-04-24 DIAGNOSIS — I5033 Acute on chronic diastolic (congestive) heart failure: Secondary | ICD-10-CM | POA: Diagnosis not present

## 2023-04-24 DIAGNOSIS — E1122 Type 2 diabetes mellitus with diabetic chronic kidney disease: Secondary | ICD-10-CM | POA: Diagnosis not present

## 2023-04-24 DIAGNOSIS — I160 Hypertensive urgency: Secondary | ICD-10-CM | POA: Diagnosis not present

## 2023-04-24 DIAGNOSIS — D849 Immunodeficiency, unspecified: Secondary | ICD-10-CM | POA: Diagnosis not present

## 2023-04-24 DIAGNOSIS — I5043 Acute on chronic combined systolic (congestive) and diastolic (congestive) heart failure: Secondary | ICD-10-CM

## 2023-04-24 DIAGNOSIS — Z8249 Family history of ischemic heart disease and other diseases of the circulatory system: Secondary | ICD-10-CM | POA: Diagnosis not present

## 2023-04-24 DIAGNOSIS — E669 Obesity, unspecified: Secondary | ICD-10-CM | POA: Diagnosis not present

## 2023-04-24 DIAGNOSIS — E7800 Pure hypercholesterolemia, unspecified: Secondary | ICD-10-CM | POA: Diagnosis not present

## 2023-04-24 DIAGNOSIS — Z7901 Long term (current) use of anticoagulants: Secondary | ICD-10-CM | POA: Diagnosis not present

## 2023-04-24 DIAGNOSIS — I420 Dilated cardiomyopathy: Secondary | ICD-10-CM | POA: Diagnosis not present

## 2023-04-24 DIAGNOSIS — I10 Essential (primary) hypertension: Secondary | ICD-10-CM | POA: Diagnosis not present

## 2023-04-24 DIAGNOSIS — I951 Orthostatic hypotension: Secondary | ICD-10-CM | POA: Diagnosis present

## 2023-04-24 DIAGNOSIS — I4821 Permanent atrial fibrillation: Secondary | ICD-10-CM

## 2023-04-24 DIAGNOSIS — I051 Rheumatic mitral insufficiency: Secondary | ICD-10-CM | POA: Diagnosis not present

## 2023-04-24 DIAGNOSIS — Z1152 Encounter for screening for COVID-19: Secondary | ICD-10-CM | POA: Diagnosis not present

## 2023-04-24 DIAGNOSIS — I69354 Hemiplegia and hemiparesis following cerebral infarction affecting left non-dominant side: Secondary | ICD-10-CM | POA: Diagnosis not present

## 2023-04-24 DIAGNOSIS — Z79899 Other long term (current) drug therapy: Secondary | ICD-10-CM | POA: Diagnosis not present

## 2023-04-24 DIAGNOSIS — F419 Anxiety disorder, unspecified: Secondary | ICD-10-CM | POA: Diagnosis not present

## 2023-04-24 DIAGNOSIS — Z833 Family history of diabetes mellitus: Secondary | ICD-10-CM | POA: Diagnosis not present

## 2023-04-24 DIAGNOSIS — I130 Hypertensive heart and chronic kidney disease with heart failure and stage 1 through stage 4 chronic kidney disease, or unspecified chronic kidney disease: Secondary | ICD-10-CM | POA: Diagnosis not present

## 2023-04-24 DIAGNOSIS — I3139 Other pericardial effusion (noninflammatory): Secondary | ICD-10-CM | POA: Diagnosis not present

## 2023-04-24 DIAGNOSIS — Z6834 Body mass index (BMI) 34.0-34.9, adult: Secondary | ICD-10-CM | POA: Diagnosis not present

## 2023-04-24 DIAGNOSIS — F32A Depression, unspecified: Secondary | ICD-10-CM | POA: Diagnosis not present

## 2023-04-24 DIAGNOSIS — J189 Pneumonia, unspecified organism: Secondary | ICD-10-CM | POA: Diagnosis not present

## 2023-04-24 DIAGNOSIS — C160 Malignant neoplasm of cardia: Secondary | ICD-10-CM | POA: Diagnosis not present

## 2023-04-24 DIAGNOSIS — R0602 Shortness of breath: Secondary | ICD-10-CM | POA: Diagnosis not present

## 2023-04-24 DIAGNOSIS — N1832 Chronic kidney disease, stage 3b: Secondary | ICD-10-CM | POA: Diagnosis not present

## 2023-04-24 LAB — CBC
HCT: 36.2 % — ABNORMAL LOW (ref 39.0–52.0)
Hemoglobin: 11.9 g/dL — ABNORMAL LOW (ref 13.0–17.0)
MCH: 27.9 pg (ref 26.0–34.0)
MCHC: 32.9 g/dL (ref 30.0–36.0)
MCV: 84.8 fL (ref 80.0–100.0)
Platelets: 214 10*3/uL (ref 150–400)
RBC: 4.27 MIL/uL (ref 4.22–5.81)
RDW: 16.2 % — ABNORMAL HIGH (ref 11.5–15.5)
WBC: 7 10*3/uL (ref 4.0–10.5)
nRBC: 0 % (ref 0.0–0.2)

## 2023-04-24 LAB — BASIC METABOLIC PANEL
Anion gap: 12 (ref 5–15)
BUN: 48 mg/dL — ABNORMAL HIGH (ref 6–20)
CO2: 20 mmol/L — ABNORMAL LOW (ref 22–32)
Calcium: 8.6 mg/dL — ABNORMAL LOW (ref 8.9–10.3)
Chloride: 105 mmol/L (ref 98–111)
Creatinine, Ser: 1.83 mg/dL — ABNORMAL HIGH (ref 0.61–1.24)
GFR, Estimated: 44 mL/min — ABNORMAL LOW (ref >60)
Glucose, Bld: 122 mg/dL — ABNORMAL HIGH (ref 70–99)
Potassium: 3.5 mmol/L (ref 3.5–5.1)
Sodium: 137 mmol/L (ref 135–145)

## 2023-04-24 LAB — ECHOCARDIOGRAM COMPLETE
AR max vel: 3.17 cm2
AV Area VTI: 3 cm2
AV Area mean vel: 2.72 cm2
AV Mean grad: 2.7 mm[Hg]
AV Peak grad: 5 mm[Hg]
Ao pk vel: 1.12 m/s
Area-P 1/2: 3.65 cm2
Height: 71 in
MV VTI: 2.63 cm2
S' Lateral: 2.7 cm
Weight: 3920 [oz_av]

## 2023-04-24 LAB — CBG MONITORING, ED
Glucose-Capillary: 119 mg/dL — ABNORMAL HIGH (ref 70–99)
Glucose-Capillary: 173 mg/dL — ABNORMAL HIGH (ref 70–99)
Glucose-Capillary: 125 mg/dL — ABNORMAL HIGH (ref 70–99)

## 2023-04-24 LAB — HIV ANTIBODY (ROUTINE TESTING W REFLEX): HIV Screen 4th Generation wRfx: NONREACTIVE

## 2023-04-24 LAB — AMMONIA: Ammonia: 16 umol/L (ref 9–35)

## 2023-04-24 LAB — GLUCOSE, CAPILLARY: Glucose-Capillary: 136 mg/dL — ABNORMAL HIGH (ref 70–99)

## 2023-04-24 MED ORDER — FUROSEMIDE 10 MG/ML IJ SOLN
40.0000 mg | Freq: Two times a day (BID) | INTRAMUSCULAR | Status: DC
Start: 2023-04-24 — End: 2023-04-25
  Administered 2023-04-24 – 2023-04-25 (×2): 40 mg via INTRAVENOUS
  Filled 2023-04-24 (×2): qty 4

## 2023-04-24 MED ORDER — PANTOPRAZOLE SODIUM 40 MG PO TBEC
40.0000 mg | DELAYED_RELEASE_TABLET | Freq: Two times a day (BID) | ORAL | Status: DC
Start: 2023-04-24 — End: 2023-04-28
  Administered 2023-04-24 – 2023-04-28 (×9): 40 mg via ORAL
  Filled 2023-04-24 (×9): qty 1

## 2023-04-24 MED ORDER — HYDRALAZINE HCL 50 MG PO TABS
50.0000 mg | ORAL_TABLET | Freq: Three times a day (TID) | ORAL | Status: DC
  Administered 2023-04-24 – 2023-04-26 (×6): 50 mg via ORAL
  Filled 2023-04-24 (×6): qty 1

## 2023-04-24 MED ORDER — SERTRALINE HCL 50 MG PO TABS: 25.0000 mg | ORAL_TABLET | Freq: Every day | ORAL | Status: DC

## 2023-04-24 MED ORDER — SERTRALINE HCL 50 MG PO TABS
75.0000 mg | ORAL_TABLET | Freq: Every day | ORAL | Status: DC
Start: 2023-04-24 — End: 2023-04-28
  Administered 2023-04-24 – 2023-04-28 (×5): 75 mg via ORAL
  Filled 2023-04-24 (×5): qty 2

## 2023-04-24 MED ORDER — APIXABAN 5 MG PO TABS
5.0000 mg | ORAL_TABLET | Freq: Two times a day (BID) | ORAL | Status: DC
Start: 2023-04-24 — End: 2023-04-28
  Administered 2023-04-24 – 2023-04-28 (×8): 5 mg via ORAL
  Filled 2023-04-24 (×8): qty 1

## 2023-04-24 MED ORDER — FUROSEMIDE 10 MG/ML IJ SOLN
40.0000 mg | Freq: Once | INTRAMUSCULAR | Status: AC
  Administered 2023-04-24: 40 mg via INTRAVENOUS
  Filled 2023-04-24: qty 4

## 2023-04-24 MED ORDER — IRBESARTAN 150 MG PO TABS
75.0000 mg | ORAL_TABLET | Freq: Every day | ORAL | Status: DC
  Administered 2023-04-24 – 2023-04-28 (×5): 75 mg via ORAL
  Filled 2023-04-24 (×5): qty 1

## 2023-04-24 MED ORDER — HYDRALAZINE HCL 50 MG PO TABS
25.0000 mg | ORAL_TABLET | Freq: Three times a day (TID) | ORAL | Status: DC
  Administered 2023-04-24: 25 mg via ORAL
  Filled 2023-04-24 (×2): qty 1

## 2023-04-24 MED ORDER — ATORVASTATIN CALCIUM 20 MG PO TABS
40.0000 mg | ORAL_TABLET | Freq: Every day | ORAL | Status: DC
Start: 2023-04-24 — End: 2023-04-28
  Administered 2023-04-24 – 2023-04-27 (×4): 40 mg via ORAL
  Filled 2023-04-24 (×4): qty 2

## 2023-04-24 MED ORDER — CARVEDILOL 6.25 MG PO TABS
6.2500 mg | ORAL_TABLET | Freq: Two times a day (BID) | ORAL | Status: DC
  Administered 2023-04-24 – 2023-04-26 (×4): 6.25 mg via ORAL
  Filled 2023-04-24 (×4): qty 1

## 2023-04-24 NOTE — Progress Notes (Signed)
Progress Note   Patient: Jeremiah Vance YHC:[id number] DOB: 21-May-1970 DOA: 04/23/2023     0 DOS: the patient was seen and examined on 04/24/2023   Brief hospital course: " Jeremiah Vance is a 53 y.o. 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  Pt was admitted to the hospital on evening of 04/23/2023 for further evaluation and management of acute on chronic HFpEF, started on diuresis with IV Lasix.  Echo from August 2024 noted EF normal at 55-60%, mild LVH, indeterminate diastolic parameters, mild MR.  Repeat Echo obtained given patient on 5-FU chemotherapy with can have cardiotoxicity including heart failure.    1/23 -- Echo now with reduced EF 40-45%, LV global hypokinesis, no noted regional WMA's, mild MR, and small pericardial effusion.  Given change in Echo findings, Cardiology consulted.   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  Less likely ischemic given no WMA's noted, no reports of chest pain. --Consult Cardiology, appreciate input --Diuresis: Lasix 40 mg IV BID --Titrate antihypertensive regimen, optimize BP control:    --Increase home hydralazine 25 >> 50 mg TID    --Add ARB - irbesartan 75 mg daily    --Hold off beta blocker for now --Strict Io's & daily weights --Monitor renal function & electrolytes --Agree with holding further antibiotics, clinically do not suspect pneumonia --Low sodium diet, fluid restriction --PRN bronchodilators for shortness of breath --Follow  up Cardiology recommendations    History of stroke --Continue home Lipitor and Eliquis   Paroxysmal atrial fibrillation (HCC): Heart rate 90s to low 100's intermittently, overall HR controlled --On Eliquis --Telemetry monitoring --Holding off beta blocker for now given acute CHF   GE junction carcinoma [facility]): Patient is receiving maintenance chemotherapy with 5-FU, last treatment was about 10 days ago.  Last oncology office visit reviewed.   --Follow-up with Dr. Cathie Hoops as scheduled   Hyperlipidemia --Lipitor   Essential hypertension - Hypertensive urgency on admission as above, possibly hypertensive heart failure --Increased home hydralazine 25 >> 50 mg PO TID --Added irbesartan as above --IV hydralazine PRN   Chronic kidney disease, stage 3b: Renal function stable.   Baseline creatinine 2.21 on 04/15/2023.   Creatinine 1.97 on admission >> 1.87  --Monitor renal function closely with diuresis --Renally dose meds & avoid nephrotoxins   Type II diabetes mellitus with renal manifestations [facility]): Recent A1c 6.5, well-controlled.  Patient is taking [place]. --Sliding scale Novolog   Depression with anxiety --Continue home medications   Obesity (BMI 30-39.9): Body weight 111.1 kg, BMI 34.17 --Encourage losing weight --Exercise and healthy diet      Subjective: Pt seen in ED holding for a bed, aunt was at bedside.  Pt reports feeling about the same, maybe little less short of breath.  Does not report increased urine output so far.  No chest pain or other complaints except being uncomfortable in the bed, needing to get repositioned but too weak to do so independently at this time.  Physical Exam: Vitals:   04/24/23 0700 04/24/23 0947 04/24/23 1014 04/24/23  1016  BP: (!) 157/91  (!) 150/107 (!) 148/97  Pulse: 92  98   Resp: (!) 30  (!) 28   Temp:  97.8 F (36.6 C)    TempSrc:  Oral    SpO2: 93%  93%   Weight:      Height:       General exam: awake, alert, no acute distress,  slightly slow to respond to questions HEENT: moist mucus membranes, hearing grossly normal  Respiratory system: faint basilar crackles, no wheezes or rhonchi, normal respiratory effort at rest, tachypneic. Cardiovascular system: normal S1/S2, RRR, 1+ BLE edema.   Gastrointestinal system: soft, NT, ND, no HSM felt, +bowel sounds. Central nervous system: A&O x 3. no gross focal neurologic deficits, normal speech Extremities: BLE venous stasis hyperpigmentation, BLE edema as above Skin: dry, intact, normal temperature Psychiatry: normal mood, flat affect    Data Reviewed:  Notable labs --  BMP with bicarb 20, glucose 122, BUN 48, Cr improved 1.83 from 1.97, Ca 8.6, Hbg stable 11.9  ECHO - EF 40-45%, global LV hypokinesis, mild MR, small pericardial effusion   Family Communication: Aunt at bedside on rounds  Disposition: Status is: Observation Remains admitted for further IV diuresis   Planned Discharge Destination: Home    Time spent: 55 minutes including time at bedside and in coordination of care  Author: Pennie Banter, DO 04/24/2023 1:28 PM  For on call review [email or web address].

## 2023-04-24 NOTE — Progress Notes (Signed)
*  PRELIMINARY RESULTS* Echocardiogram 2D Echocardiogram has been performed.  Jeremiah Vance 04/24/2023, 10:34 AM

## 2023-04-24 NOTE — Consult Note (Signed)
Cardiology Consultation   Patient ID: Jeremiah Vance MRN: [id number]; DOB: 09-13-1970  Admit date: 04/23/2023 Date of Consult: 04/24/2023  PCP:  Debera Lat, PA-C   [facility] Providers Cardiologist:  Debbe Odea, MD        Patient Profile:   Jeremiah Vance is a 53 y.o. 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 Denton Lank.  History of Present Illness:   Jeremiah Vance had prior echo in 06/2017 with EF 40-45%, diffuse HK worse in the inferior and inferoseptal walls, mildly dilated LV cavity, moderate LVH, trivial AI, mildly dilated LA, mildly dilated RV with mildly reduced RVSF and trivial pericardial effusion. No ischemic testing was undertaken. Follow up echo in 08/2017 with improved LVSF, EF 60-65%. Admission to [facility] 07/2019 with atypical chest pain, noncompliance with medications for several months. Echo showed a low normal LVSF with an EF of 50-55%. Again admitted 01/2020 for CHF exacerbation, respiratory failure, and hypertensive urgency. Suspected secondary to dietary noncompliance. Diuresed with IV Lasix. Echo showed LVEF 50-55%. Admitted again 10/2021 with acute CVA and hypertensive emergency. Echo showed LVEF 30-35%, G1DD. He was diuresed and discharged with torsemide 40 mg daily. LHC not performed due to CKD and ICH. Admitted again 12/2021 for CVA. Carotid ultrasound showed no significant stenosis in the anterior circulation, antegrade vertebral flow bilaterally. Echo 01/16/22 showed LVEF 55-60%.   He follows with Dr. Azucena Cecil and most recently saw Cadence Furth in our clinic 02/11/2022 for routine follow up. At that time he was doing well and recovering from his recent stroke. He was  continued on torsemide 40 mg, Coreg 12.5 mg BID, irbesartan 300 mg, Farxiga 10 mg, statin, and ASA.   Hospitalized several times in 2024 for CVA and anemia secondary to bleeding associated with GE junction carcinoma. Lost to follow up with our group. Follows with Dr. Cathie Hoops for oncology.   Today, history is obtained from patient and family member who is at the bedside. Report that patient has not been feeling well for the past 4-5 days. He had symptoms of dry cough, shortness of breath, fatigue, and body aches. He also had decreased appetite and was not able to take his medications due to not feeling well. He did note increased swelling in his feet. He denies chest pain, palpitations, nausea, vomiting, fever, chills, and diaphoresis. Patient lives with his sister who assists him with his medications. He is able to ambulate independently with a walker.   In the ED, BP 172/125, HR 91, RR 20, T 97.3F, SpO2 94%. BMP with glucose 140, Cr 1.97, BUN 53. CBC shows hgb 12.4. BNP 136. EKG without ischemic changes. CXR shows cardiomegaly with vascular congestion and bilateral perihilar opacities and lower lobe opacities, left greater than right. Favor asymmetric edema. Cannot exclude pneumonia. Patient was started on antibiotics for presumed pneumonia. Patient admitted and hospitalist felt the patient's symptoms were clinically more consistent with CHF. Antibiotics discontinued and patient started on IV Lasix 40 mg BID and oral hydralazine 25 mg TID for BP.   Past Medical History:  Diagnosis Date   Acute ischemic stroke (HCC) 09/06/2017   Acute renal failure superimposed on stage 3a chronic kidney disease (HCC) 07/08/2019   Acute right-sided weakness  Allergies    Anasarca    Anemia 07/10/2017   Arthritis    Cancer (HCC)    stomach and esophageal cancer stage 4   CHF (congestive heart failure) (HCC)    "coinsided w/kidney problems I was having 06/2017"   Chicken pox    CKD (chronic kidney disease) stage 3,  GFR 30-59 ml/min (HCC) 06/2017   Depression    Elevated troponin 07/08/2019   High cholesterol    History of cardiomyopathy    LVEF 40 to 45% in April 2019 - subsequently normalized   Hyperbilirubinemia 07/10/2017   Hypertension    Ischemic stroke (HCC)    Small left internal capsule infarct due to lacunar disease   Morbid obesity (HCC)    Normocytic anemia 12/16/2017   Recurrent incisional hernia with incarceration s/p repair 10/22/2017 10/21/2017   Stroke (HCC) 08/2017   "right sided weakness since; getting stronger though" (10/22/2017)   Type 2 diabetes mellitus (HCC)     Past Surgical History:  Procedure Laterality Date   ABDOMINAL HERNIA REPAIR  2008; 10/22/2017   "scope; OPEN REPAIR INCARCERATED VENTRAL HERNIA   ESOPHAGOGASTRODUODENOSCOPY (EGD) WITH PROPOFOL N/A 09/10/2022   Procedure: ESOPHAGOGASTRODUODENOSCOPY (EGD) WITH PROPOFOL;  Surgeon: Toney Reil, MD;  Location: [facility] ENDOSCOPY;  Service: Gastroenterology;  Laterality: N/A;   ESOPHAGOGASTRODUODENOSCOPY (EGD) WITH PROPOFOL N/A 09/28/2022   Procedure: ESOPHAGOGASTRODUODENOSCOPY (EGD) WITH PROPOFOL;  Surgeon: Jaynie Collins, DO;  Location: [facility] ENDOSCOPY;  Service: Gastroenterology;  Laterality: N/A;   HEMOSTASIS CONTROL  09/28/2022   Procedure: HEMOSTASIS CONTROL;  Surgeon: Jaynie Collins, DO;  Location: [facility] ENDOSCOPY;  Service: Gastroenterology;;   HERNIA REPAIR     KNEE ARTHROSCOPY Right 1989   PORTA CATH INSERTION N/A 09/25/2022   Procedure: PORTA CATH INSERTION;  Surgeon: Annice Needy, MD;  Location: [facility] INVASIVE CV LAB;  Service: Cardiovascular;  Laterality: N/A;   VENTRAL HERNIA REPAIR N/A 10/22/2017   Procedure: OPEN REPAIR INCARCERATED VENTRAL HERNIA;  Surgeon: Glenna Fellows, MD;  Location: [facility] OR;  Service: General;  Laterality: N/A;       Inpatient Medications: Scheduled Meds:  apixaban  2.5 mg Oral BID   atorvastatin  40 mg Oral q1800   busPIRone  7.5 mg Oral BID   furosemide  40  mg Intravenous Q12H   hydrALAZINE  50 mg Oral Q8H   insulin aspart  0-5 Units Subcutaneous QHS   insulin aspart  0-9 Units Subcutaneous TID WC   irbesartan  75 mg Oral Daily   pantoprazole  40 mg Oral BID AC   sertraline  75 mg Oral Daily   Continuous Infusions:  PRN Meds: acetaminophen, albuterol, dextromethorphan-guaiFENesin, hydrALAZINE, ondansetron (ZOFRAN) IV, oxyCODONE  Allergies:    Allergies  Allergen Reactions   Pollen Extract Other (See Comments)   Sulfa Antibiotics     Family History:    Family History  Problem Relation Age of Onset   Diabetes Mother    Stroke Mother    Arthritis Mother    Depression Mother    Heart disease Mother    Hypertension Mother    Learning disabilities Mother    Mental illness Mother    Sleep apnea Mother    Diabetes Father    Heart disease Father    Arthritis Father    Hearing loss Father    Hyperlipidemia Father    Heart attack Father    Hypertension Father    Stroke Father    Diabetes Sister    Depression Sister  Diabetes Sister    Hypertension Sister    Mental illness Sister    Sleep apnea Sister    Diabetes Maternal Grandmother    Heart disease Maternal Grandmother    Depression Maternal Grandmother    Hyperlipidemia Maternal Grandmother    Hypertension Maternal Grandmother    Stroke Maternal Grandmother    Hypertension Maternal Grandfather    Hyperlipidemia Maternal Grandfather    Stroke Maternal Grandfather    Arthritis Paternal Grandmother    Hearing loss Paternal Grandmother    Stroke Paternal Grandfather    Heart disease Paternal Grandfather    Arthritis Paternal Grandfather    Heart attack Paternal Grandfather    Melanoma Other     ROS:  Please see the history of present illness.   Physical Exam/Data:   Vitals:   04/24/23 0700 04/24/23 0947 04/24/23 1014 04/24/23 1016  BP: (!) 157/91  (!) 150/107 (!) 148/97  Pulse: 92  98   Resp: (!) 30  (!) 28   Temp:  97.8 F (36.6 C)    TempSrc:  Oral     SpO2: 93%  93%   Weight:      Height:        Intake/Output Summary (Last 24 hours) at 04/24/2023 1347 Last data filed at 04/24/2023 0600 Gross per 24 hour  Intake 250 ml  Output 1650 ml  Net -1400 ml      04/23/2023    5:10 PM 04/15/2023    9:01 AM 04/01/2023    9:24 AM  Last 3 Weights  Weight (lbs) 245 lb 243 lb 243 lb 9.6 oz  Weight (kg) 111.131 kg 110.224 kg 110.496 kg     Body mass index is 34.17 kg/m.  General:  Well nourished, well developed, in no acute distress HEENT: normal Neck: Unable to evaluate JVD Cardiac:  normal S1, S2; RRR; no murmur  Lungs: Crackles at the bases bilaterally Abd: soft, nontender, no hepatomegaly  Ext: no edema, bilateral venous stasis Skin: warm and dry  Psych:  Normal affect   EKG:  The EKG was personally reviewed and demonstrates:  atrial fibrillation, rate 99 bpm Telemetry:  Telemetry was personally reviewed and demonstrates: On exam, patient not on telemetry  Relevant CV Studies:  04/24/2023 TTE 1. Left ventricular ejection fraction, by estimation, is 40 to 45%. The  left ventricle has mildly decreased function. The left ventricle  demonstrates global hypokinesis. There is moderate left ventricular  hypertrophy. Left ventricular diastolic  parameters are indeterminate.   2. Right ventricular systolic function is mildly reduced. The right  ventricular size is normal.   3. Left atrial size was moderately dilated.   4. A small pericardial effusion is present.   5. The mitral valve is normal in structure. Mild mitral valve  regurgitation. No evidence of mitral stenosis.   6. The aortic valve is normal in structure. Aortic valve regurgitation is  not visualized. No aortic stenosis is present.   7. There is mild dilatation of the aortic root, measuring 41 mm. There is  mild dilatation of the ascending aorta, measuring 42 mm.   8. The inferior vena cava is normal in size with greater than 50%  respiratory variability, suggesting right  atrial pressure of 3 mmHg.   11/22/2022 TTE 1. Left ventricular ejection fraction, by estimation, is 55 to 60%. The  left ventricle has normal function. The left ventricle has no regional  wall motion abnormalities. There is mild left ventricular hypertrophy.  Left ventricular diastolic parameters  are indeterminate.   2. Right ventricular systolic function is low normal. The right  ventricular size is mildly enlarged. There is normal pulmonary artery  systolic pressure.   3. Left atrial size was mildly dilated.   4. The mitral valve is normal in structure. Mild mitral valve  regurgitation.   5. The aortic valve is tricuspid. Aortic valve regurgitation is not  visualized.   6. Aortic dilatation noted. There is borderline dilatation of the aortic  root, measuring 38 mm.   7. The inferior vena cava is dilated in size with <50% respiratory  variability, suggesting right atrial pressure of 15 mmHg.   04/16/2022 Long term monitor Atrial Fibrillation occurred (89% burden), ranging from 43-180 bpm (avg of 81 bpm), the longest lasting 12 days 5 hours with an avg rate of 81 bpm.  01/16/2022 TTE 1. Left ventricular ejection fraction, by estimation, is 55 to 60%. The  left ventricle has normal function. The left ventricle has no regional  wall motion abnormalities. There is mild left ventricular hypertrophy.  Left ventricular diastolic parameters  are consistent with Grade I diastolic dysfunction (impaired relaxation).   2. Right ventricular systolic function is normal. The right ventricular  size is normal.   3. The mitral valve is grossly normal. No evidence of mitral valve  regurgitation.   4. The aortic valve is calcified. Aortic valve regurgitation is not  visualized. Aortic valve sclerosis/calcification is present, without any  evidence of aortic stenosis.   5. Aortic dilatation noted. There is mild dilatation of the ascending  aorta, measuring 43 mm.   6. The inferior vena cava is  normal in size with greater than 50%  respiratory variability, suggesting right atrial pressure of 3 mmHg.   7. Agitated saline contrast bubble study was negative, with no evidence  of any interatrial shunt.   11/23/2021 TTE 1. Left ventricular ejection fraction, by estimation, is 30 to 35%. The  left ventricle has moderately decreased function. The left ventricle  demonstrates global hypokinesis. There is severe left ventricular  hypertrophy. Left ventricular diastolic  parameters are consistent with Grade I diastolic dysfunction (impaired  relaxation).   2. Right ventricular systolic function is mildly reduced. The right  ventricular size is mildly enlarged. There is normal pulmonary artery  systolic pressure. The estimated right ventricular systolic pressure is  22.7 mmHg.   3. Left atrial size was moderately dilated.   4. The mitral valve is normal in structure. No evidence of mitral valve  regurgitation. No evidence of mitral stenosis.   5. The aortic valve is normal in structure. Aortic valve regurgitation is  not visualized. Aortic valve sclerosis is present, with no evidence of  aortic valve stenosis.   6. There is mild dilatation of the aortic root, measuring 42 mm.   7. The inferior vena cava is normal in size with greater than 50%  respiratory variability, suggesting right atrial pressure of 3 mmHg.   8. Challenging images   Laboratory Data:  High Sensitivity Troponin:  No results for input(s): "TROPONINIHS" in the last 720 hours.   Chemistry Recent Labs  Lab 04/23/23 1713 04/24/23 0622  NA 135 137  K 4.0 3.5  CL 108 105  CO2 18* 20*  GLUCOSE 140* 122*  BUN 53* 48*  CREATININE 1.97* 1.83*  CALCIUM 8.6* 8.6*  GFRNONAA 40* 44*  ANIONGAP 9 12    No results for input(s): "PROT", "ALBUMIN", "AST", "ALT", "ALKPHOS", "BILITOT" in the last 168 hours. Lipids No results for input(s): "  CHOL", "TRIG", "HDL", "LABVLDL", "LDLCALC", "CHOLHDL" in the last 168 hours.   Hematology Recent Labs  Lab 04/23/23 1713 04/24/23 0622  WBC 6.0 7.0  RBC 4.40 4.27  HGB 12.4* 11.9*  HCT 38.8* 36.2*  MCV 88.2 84.8  MCH 28.2 27.9  MCHC 32.0 32.9  RDW 16.2* 16.2*  PLT 209 214   Thyroid No results for input(s): "TSH", "FREET4" in the last 168 hours.  BNP Recent Labs  Lab 04/23/23 1713  BNP 136.0*    DDimer No results for input(s): "DDIMER" in the last 168 hours.   Radiology/Studies:  ECHOCARDIOGRAM COMPLETE Result Date: 04/24/2023 IMPRESSIONS  1. Left ventricular ejection fraction, by estimation, is 40 to 45%. The left ventricle has mildly decreased function. The left ventricle demonstrates global hypokinesis. There is moderate left ventricular hypertrophy. Left ventricular diastolic parameters are indeterminate.  2. Right ventricular systolic function is mildly reduced. The right ventricular size is normal.  3. Left atrial size was moderately dilated.  4. A small pericardial effusion is present.  5. The mitral valve is normal in structure. Mild mitral valve regurgitation. No evidence of mitral stenosis.  6. The aortic valve is normal in structure. Aortic valve regurgitation is not visualized. No aortic stenosis is present.  7. There is mild dilatation of the aortic root, measuring 41 mm. There is mild dilatation of the ascending aorta, measuring 42 mm.  8. The inferior vena cava is normal in size with greater than 50% respiratory variability, suggesting right atrial pressure of 3 mmHg. Electronically signed by Julien Nordmann MD Signature Date/Time: 04/24/2023/1:11:41 PM    Final    DG Chest 2 View Result Date: 04/23/2023 IMPRESSION: Cardiomegaly with vascular congestion and bilateral perihilar opacities and lower lobe opacities, left greater than right. Favor asymmetric edema. Cannot exclude pneumonia. Small to moderate left pleural effusion. Electronically Signed   By: Charlett Nose M.D.   On: 04/23/2023 17:40     Assessment and Plan:   HFmrEF Hypertensive  urgency Essential hypertension - History of cardiomyopathy dating back to 2019. LVEF reduced to 30-35% in 10/2021. Recent echo 11/22/2022 showed LVEF 55-60%. - Presented 1/22 with symptoms of dry cough and shortness of breath - Echo 04/24/2023 showed LVEF 40-45% with global hypokinesis, moderate LVT, small pericardial effusion, mild mitral valve regurgitation - Initial BP with systolic 170-180 mmHg - Suspect multifactorial nature of acute CHF including chemo-induced cardiomyopathy with some hypertensive and afib contribution - BNP 136 - PTA hydralazine increased to 50 mg TID and started on irbesartan 75 mg today 1/23 per hospitalist with no BP documented since administration of medications - Started on IV Lasix 40 mg BID yesterday 1/22 with -1.4 L net output - Cr 1.97>1.83, improving with diuresis - Appears euvolemic on exam - Continue IV Lasix 40 mg BID - Continue to monitor renal function, strict I/Os, and daily weights  Paroxysmal atrial fibrillation - EKG shows atrial fibrillation with rate 99 bpm - Continue telemetry monitoring - No BB at this time due to acute CHF - Not on appropriate dose of Eliquis for stroke prevention. Dose was reduced due to bleeding from tumors causing symptomatic anemia. Will defer to oncology for dose adjustments.   Hyperlipidemia - Most recent lipid panel 10/2022 with LDL 63 - Continue statin  CKD IIIb - Cr 1.97 on admission, now 1.83. Will continue to monitor with diuresis  T2DM - Management per IM  GE junction carcinoma - On maintenance chemo with 5-FU - Management per IM  For questions or updates, please  contact [facility] Please consult [email or web address] for contact info under    Signed, Orion Crook, PA-C  04/24/2023 1:47 PM

## 2023-04-24 NOTE — Progress Notes (Signed)
Heart Failure Nurse Navigator Progress Note  PCP: Jeremiah Lat, PA-C PCP-Cardiologist: Jeremiah Iba, MD Advanced Heart Failure:of Jeremiah Kindred, FNP Admission Diagnosis:  Community acquired pneumonia, unspecified laterality Admitted from: Home  Presentation:   Jeremiah Vance presented with headache, congestion, SOB and dizziness and poor appetite. BNP 136.0. Hx: CHF.  ECHO/ LVEF: 40-45%  Clinical Course:  Past Medical History:  Diagnosis Date   Acute ischemic stroke (HCC) 09/06/2017   Acute renal failure superimposed on stage 3a chronic kidney disease (HCC) 07/08/2019   Acute right-sided weakness    Allergies    Anasarca    Anemia 07/10/2017   Arthritis    Cancer (HCC)    stomach and esophageal cancer stage 4   CHF (congestive heart failure) (HCC)    "coinsided w/kidney problems I was having 06/2017"   Chicken pox    CKD (chronic kidney disease) stage 3, GFR 30-59 ml/min (HCC) 06/2017   Depression    Elevated troponin 07/08/2019   High cholesterol    History of cardiomyopathy    LVEF 40 to 45% in April 2019 - subsequently normalized   Hyperbilirubinemia 07/10/2017   Hypertension    Ischemic stroke (HCC)    Small left internal capsule infarct due to lacunar disease   Morbid obesity (HCC)    Normocytic anemia 12/16/2017   Recurrent incisional hernia with incarceration s/p repair 10/22/2017 10/21/2017   Stroke (HCC) 08/2017   "right sided weakness since; getting stronger though" (10/22/2017)   Type 2 diabetes mellitus (HCC)      Social History   Socioeconomic History   Marital status: Divorced    Spouse name: Not on file   Number of children: 0   Years of education: Not on file   Highest education level: Not on file  Occupational History   Occupation: Disabled  Tobacco Use   Smoking status: Never   Smokeless tobacco: Never  Vaping Use   Vaping status: Never Used  Substance and Sexual Activity   Alcohol use: Yes    Comment: occasional beer   Drug use:  Never   Sexual activity: Not Currently  Other Topics Concern   Not on file  Social History Narrative   He lives with his sister , Jeremiah Vance and she is his MPOA after his stokes   Social Drivers of Corporate investment banker Strain: Low Risk  (04/24/2023)   Overall Financial Resource Strain (CARDIA)    Difficulty of Paying Living Expenses: Not hard at all  Food Insecurity: No Food Insecurity (04/24/2023)   Hunger Vital Sign    Worried About Running Out of Food in the Last Year: Never true    Ran Out of Food in the Last Year: Never true  Transportation Needs: No Transportation Needs (04/24/2023)   PRAPARE - Administrator, Civil Service (Medical): No    Lack of Transportation (Non-Medical): No  Physical Activity: Not on file  Stress: Not on file  Social Connections: Unknown (04/24/2023)   Social Connection and Isolation Panel [NHANES]    Frequency of Communication with Friends and Family: Not on file    Frequency of Social Gatherings with Friends and Family: Not on file    Attends Religious Services: Not on file    Active Member of Clubs or Organizations: Not on file    Attends Banker Meetings: Not on file    Marital Status: Divorced   Education Assessment and Provision:  Detailed education and instructions provided on heart failure disease  management including the following:  Signs and symptoms of Heart Failure When to call the physician Importance of daily weights Low sodium diet Fluid restriction Medication management Anticipated future follow-up appointments  Patient education given on each of the above topics.  Patient acknowledges understanding via teach back method and acceptance of all instructions.  Education Materials:  "Living Better With Heart Failure" Booklet, HF zone tool, & Daily Weight Tracker Tool.  Patient has scale at home: Yes Patient has pill box at home: Yes    High Risk Criteria for Readmission and/or Poor Patient  Outcomes: Heart failure hospital admissions (last 6 months): 0  No Show rate: 8 Difficult social situation: None Demonstrates medication adherence: No Primary Language: English Literacy level: Reading, Writing & Comprehension  Barriers of Care:   Diet & Fluid Restrictions Daily Weights Medication Compliance  Considerations/Referrals:   Referral made to Heart Failure Pharmacist Stewardship: No Referral made to Heart Failure CSW/NCM TOC: No Referral made to Heart & Vascular TOC clinic: No-Patient currently under the care of Oncology for stage IV GE junction/gastric cancer.  Items for Follow-up on DC/TOC: Patient seen at the CHF clinic in 2023. Patient prefers not to have any anticipated future follow-up appointments at this time due to oncology treatments.  Roxy Horseman, RN, BSN [facility] Heart Failure Navigator Secure Chat Only

## 2023-04-24 NOTE — ED Notes (Signed)
This tech assisted pt in eating their dinner meal due to pt not being able to see well. Pt consumed about 75% of meal. No further complaints by pt at this time.

## 2023-04-25 DIAGNOSIS — I4821 Permanent atrial fibrillation: Secondary | ICD-10-CM | POA: Diagnosis not present

## 2023-04-25 DIAGNOSIS — I5033 Acute on chronic diastolic (congestive) heart failure: Secondary | ICD-10-CM | POA: Diagnosis not present

## 2023-04-25 LAB — BASIC METABOLIC PANEL
Anion gap: 13 (ref 5–15)
BUN: 43 mg/dL — ABNORMAL HIGH (ref 6–20)
CO2: 19 mmol/L — ABNORMAL LOW (ref 22–32)
Calcium: 9 mg/dL (ref 8.9–10.3)
Chloride: 105 mmol/L (ref 98–111)
Creatinine, Ser: 1.9 mg/dL — ABNORMAL HIGH (ref 0.61–1.24)
GFR, Estimated: 42 mL/min — ABNORMAL LOW (ref >60)
Glucose, Bld: 112 mg/dL — ABNORMAL HIGH (ref 70–99)
Potassium: 3.4 mmol/L — ABNORMAL LOW (ref 3.5–5.1)
Sodium: 137 mmol/L (ref 135–145)

## 2023-04-25 LAB — GLUCOSE, CAPILLARY
Glucose-Capillary: 111 mg/dL — ABNORMAL HIGH (ref 70–99)
Glucose-Capillary: 209 mg/dL — ABNORMAL HIGH (ref 70–99)
Glucose-Capillary: 200 mg/dL — ABNORMAL HIGH (ref 70–99)
Glucose-Capillary: 166 mg/dL — ABNORMAL HIGH (ref 70–99)

## 2023-04-25 LAB — MAGNESIUM: Magnesium: 1.9 mg/dL (ref 1.7–2.4)

## 2023-04-25 MED ORDER — POTASSIUM CHLORIDE CRYS ER 20 MEQ PO TBCR
40.0000 meq | EXTENDED_RELEASE_TABLET | Freq: Once | ORAL | Status: AC
  Administered 2023-04-25: 40 meq via ORAL
  Filled 2023-04-25: qty 2

## 2023-04-25 MED ORDER — DAPAGLIFLOZIN PROPANEDIOL 10 MG PO TABS
10.0000 mg | ORAL_TABLET | Freq: Every day | ORAL | Status: DC
  Administered 2023-04-25 – 2023-04-28 (×4): 10 mg via ORAL
  Filled 2023-04-25 (×4): qty 1

## 2023-04-25 MED ORDER — ENSURE ENLIVE PO LIQD
237.0000 mL | Freq: Two times a day (BID) | ORAL | Status: DC
  Administered 2023-04-25 – 2023-04-28 (×6): 237 mL via ORAL

## 2023-04-25 NOTE — Plan of Care (Signed)
  Problem: Clinical Measurements: Goal: Will remain free from infection Outcome: Progressing   Problem: Activity: Goal: Risk for activity intolerance will decrease Outcome: Progressing   Problem: Nutrition: Goal: Adequate nutrition will be maintained Outcome: Progressing   Problem: Elimination: Goal: Will not experience complications related to bowel motility Outcome: Progressing   Problem: Pain Managment: Goal: General experience of comfort will improve and/or be controlled Outcome: Progressing   Problem: Safety: Goal: Ability to remain free from injury will improve Outcome: Progressing   Problem: Skin Integrity: Goal: Risk for impaired skin integrity will decrease Outcome: Progressing   Problem: Education: Goal: Ability to verbalize understanding of medication therapies will improve Outcome: Progressing

## 2023-04-25 NOTE — Progress Notes (Signed)
Rounding Note    Patient Name: Jeremiah Vance Date of Encounter: 04/25/2023  [facility] Cardiologist: Debbe Odea, MD   Subjective   Patient reports feeling about the same as yesterday. He has been up to use the restroom on his own and notes some lightheadedness and shortness of breath with that. Blood pressure is improving. Cr up slightly from yesterday.   Inpatient Medications    Scheduled Meds:  apixaban  5 mg Oral BID   atorvastatin  40 mg Oral q1800   busPIRone  7.5 mg Oral BID   carvedilol  6.25 mg Oral BID WC   dapagliflozin propanediol  10 mg Oral Daily   feeding supplement  237 mL Oral BID BM   hydrALAZINE  50 mg Oral Q8H   insulin aspart  0-5 Units Subcutaneous QHS   insulin aspart  0-9 Units Subcutaneous TID WC   irbesartan  75 mg Oral Daily   pantoprazole  40 mg Oral BID AC   sertraline  75 mg Oral Daily   Continuous Infusions:  PRN Meds: acetaminophen, albuterol, dextromethorphan-guaiFENesin, hydrALAZINE, ondansetron (ZOFRAN) IV, oxyCODONE   Vital Signs    Vitals:   04/24/23 2041 04/25/23 0306 04/25/23 0454 04/25/23 0753  BP: (!) 141/85  (!) 143/74 133/76  Pulse: 93  85 82  Resp: 18  18 18   Temp: (!) 97 F (36.1 C)  98 F (36.7 C) 97.9 F (36.6 C)  TempSrc: Axillary   Oral  SpO2: 93%  94% 96%  Weight:  108.5 kg    Height:       No intake or output data in the 24 hours ending 04/25/23 1023    04/25/2023    3:06 AM 04/23/2023    5:10 PM 04/15/2023    9:01 AM  Last 3 Weights  Weight (lbs) 239 lb 3.2 oz 245 lb 243 lb  Weight (kg) 108.5 kg 111.131 kg 110.224 kg      Telemetry    Atrial fibrillation, rate 80-90 bpm - Personally Reviewed  Physical Exam   GEN: No acute distress.   Neck: No JVD Cardiac: IRIR, no murmurs, rubs, or gallops.  Respiratory: Faint crackles in the bases bilaterally GI: Soft, nontender, non-distended  MS: No edema; No deformity. Neuro:  Nonfocal  Psych: Normal affect   Labs    High  Sensitivity Troponin:  No results for input(s): "TROPONINIHS" in the last 720 hours.   Chemistry Recent Labs  Lab 04/23/23 1713 04/24/23 0622 04/25/23 0521  NA 135 137 137  K 4.0 3.5 3.4*  CL 108 105 105  CO2 18* 20* 19*  GLUCOSE 140* 122* 112*  BUN 53* 48* 43*  CREATININE 1.97* 1.83* 1.90*  CALCIUM 8.6* 8.6* 9.0  MG  --   --  1.9  GFRNONAA 40* 44* 42*  ANIONGAP 9 12 13     Lipids No results for input(s): "CHOL", "TRIG", "HDL", "LABVLDL", "LDLCALC", "CHOLHDL" in the last 168 hours.  Hematology Recent Labs  Lab 04/23/23 1713 04/24/23 0622  WBC 6.0 7.0  RBC 4.40 4.27  HGB 12.4* 11.9*  HCT 38.8* 36.2*  MCV 88.2 84.8  MCH 28.2 27.9  MCHC 32.0 32.9  RDW 16.2* 16.2*  PLT 209 214   Thyroid No results for input(s): "TSH", "FREET4" in the last 168 hours.  BNP Recent Labs  Lab 04/23/23 1713  BNP 136.0*    DDimer No results for input(s): "DDIMER" in the last 168 hours.   Radiology    ECHOCARDIOGRAM COMPLETE Result  Date: 04/24/2023    ECHOCARDIOGRAM REPORT   Patient Name:   NATURE VOGELSANG Date of Exam: 04/24/2023 Medical Rec #:  [id number]       Height:       71.0 in Accession #:    [id number]      Weight:       245.0 lb Date of Birth:  [birth year]       BSA:          2.298 m Patient Age:    52 years        BP:           157/91 mmHg Patient Gender: M               HR:           90 bpm. Exam Location:  [facility] Procedure: 2D Echo, Cardiac Doppler and Color Doppler Indications:     CHF  History:         Patient has prior history of Echocardiogram examinations, most                  recent 11/22/2022. CHF, Stroke, Arrythmias:Atrial Fibrillation,                  Signs/Symptoms:Fatigue and Shortness of Breath; Risk                  Factors:Hypertension, Diabetes, Dyslipidemia and Sleep Apnea.                  CKD.  Sonographer:     Mikki Harbor Referring Phys:  [id number] Tresa Endo A GRIFFITH Diagnosing Phys: Julien Nordmann MD  Sonographer Comments: Patient is obese. IMPRESSIONS  1. Left  ventricular ejection fraction, by estimation, is 40 to 45%. The left ventricle has mildly decreased function. The left ventricle demonstrates global hypokinesis. There is moderate left ventricular hypertrophy. Left ventricular diastolic parameters are indeterminate.  2. Right ventricular systolic function is mildly reduced. The right ventricular size is normal.  3. Left atrial size was moderately dilated.  4. A small pericardial effusion is present.  5. The mitral valve is normal in structure. Mild mitral valve regurgitation. No evidence of mitral stenosis.  6. The aortic valve is normal in structure. Aortic valve regurgitation is not visualized. No aortic stenosis is present.  7. There is mild dilatation of the aortic root, measuring 41 mm. There is mild dilatation of the ascending aorta, measuring 42 mm.  8. The inferior vena cava is normal in size with greater than 50% respiratory variability, suggesting right atrial pressure of 3 mmHg. FINDINGS  Left Ventricle: Left ventricular ejection fraction, by estimation, is 40 to 45%. The left ventricle has mildly decreased function. The left ventricle demonstrates global hypokinesis. The left ventricular internal cavity size was normal in size. There is  moderate left ventricular hypertrophy. Left ventricular diastolic parameters are indeterminate. Right Ventricle: The right ventricular size is normal. No increase in right ventricular wall thickness. Right ventricular systolic function is mildly reduced. Left Atrium: Left atrial size was moderately dilated. Right Atrium: Right atrial size was normal in size. Pericardium: A small pericardial effusion is present. Mitral Valve: The mitral valve is normal in structure. Mild mitral annular calcification. Mild mitral valve regurgitation. No evidence of mitral valve stenosis. MV peak gradient, 4.4 mmHg. The mean mitral valve gradient is 2.0 mmHg. Tricuspid Valve: The tricuspid valve is normal in structure. Tricuspid valve  regurgitation is not demonstrated. No evidence of tricuspid stenosis. Aortic Valve: The  aortic valve is normal in structure. Aortic valve regurgitation is not visualized. No aortic stenosis is present. Aortic valve mean gradient measures 2.7 mmHg. Aortic valve peak gradient measures 5.0 mmHg. Aortic valve area, by VTI measures 3.00 cm. Pulmonic Valve: The pulmonic valve was normal in structure. Pulmonic valve regurgitation is not visualized. No evidence of pulmonic stenosis. Aorta: The aortic root is normal in size and structure. There is mild dilatation of the aortic root, measuring 41 mm. There is mild dilatation of the ascending aorta, measuring 42 mm. Venous: The inferior vena cava is normal in size with greater than 50% respiratory variability, suggesting right atrial pressure of 3 mmHg. IAS/Shunts: No atrial level shunt detected by color flow Doppler.  LEFT VENTRICLE PLAX 2D LVIDd:         5.40 cm LVIDs:         2.70 cm LV PW:         1.50 cm LV IVS:        1.60 cm LVOT diam:     2.20 cm LV SV:         60 LV SV Index:   26 LVOT Area:     3.80 cm  RIGHT VENTRICLE RV Basal diam:  3.80 cm RV Mid diam:    3.70 cm LEFT ATRIUM             Index        RIGHT ATRIUM           Index LA diam:        4.90 cm 2.13 cm/m   RA Area:     20.10 cm LA Vol (A2C):   73.9 ml 32.15 ml/m  RA Volume:   57.10 ml  24.84 ml/m LA Vol (A4C):   97.4 ml 42.38 ml/m LA Biplane Vol: 87.3 ml 37.98 ml/m  AORTIC VALVE                    PULMONIC VALVE AV Area (Vmax):    3.17 cm     PV Vmax:       0.88 m/s AV Area (Vmean):   2.72 cm     PV Peak grad:  3.1 mmHg AV Area (VTI):     3.00 cm AV Vmax:           111.67 cm/s AV Vmean:          77.900 cm/s AV VTI:            0.200 m AV Peak Grad:      5.0 mmHg AV Mean Grad:      2.7 mmHg LVOT Vmax:         93.25 cm/s LVOT Vmean:        55.650 cm/s LVOT VTI:          0.158 m LVOT/AV VTI ratio: 0.79  AORTA Ao Root diam: 4.10 cm Ao Asc diam:  4.20 cm MITRAL VALVE MV Area (PHT): 3.65 cm    SHUNTS  MV Area VTI:   2.63 cm    Systemic VTI:  0.16 m MV Peak grad:  4.4 mmHg    Systemic Diam: 2.20 cm MV Mean grad:  2.0 mmHg MV Vmax:       1.05 m/s MV Vmean:      58.7 cm/s MV Decel Time: 208 msec MV E velocity: 84.00 cm/s Julien Nordmann MD Electronically signed by Julien Nordmann MD Signature Date/Time: 04/24/2023/1:11:41 PM    Final    DG Chest  2 View Result Date: 04/23/2023 CLINICAL DATA:  Congestion, shortness of breath.  COPD EXAM: CHEST - 2 VIEW COMPARISON:  10/27/2022 FINDINGS: Right Port-A-Cath in place with the tip at the cavoatrial junction. Cardiomegaly with vascular congestion. Left pleural effusion with perihilar and lower lobe airspace opacities, left greater than right. No acute bony abnormality. IMPRESSION: Cardiomegaly with vascular congestion and bilateral perihilar opacities and lower lobe opacities, left greater than right. Favor asymmetric edema. Cannot exclude pneumonia. Small to moderate left pleural effusion. Electronically Signed   By: Charlett Nose M.D.   On: 04/23/2023 17:40    Cardiac Studies   04/24/2023 TTE 1. Left ventricular ejection fraction, by estimation, is 40 to 45%. The  left ventricle has mildly decreased function. The left ventricle  demonstrates global hypokinesis. There is moderate left ventricular  hypertrophy. Left ventricular diastolic  parameters are indeterminate.   2. Right ventricular systolic function is mildly reduced. The right  ventricular size is normal.   3. Left atrial size was moderately dilated.   4. A small pericardial effusion is present.   5. The mitral valve is normal in structure. Mild mitral valve  regurgitation. No evidence of mitral stenosis.   6. The aortic valve is normal in structure. Aortic valve regurgitation is  not visualized. No aortic stenosis is present.   7. There is mild dilatation of the aortic root, measuring 41 mm. There is  mild dilatation of the ascending aorta, measuring 42 mm.   8. The inferior vena cava is normal  in size with greater than 50%  respiratory variability, suggesting right atrial pressure of 3 mmHg.    11/22/2022 TTE 1. Left ventricular ejection fraction, by estimation, is 55 to 60%. The  left ventricle has normal function. The left ventricle has no regional  wall motion abnormalities. There is mild left ventricular hypertrophy.  Left ventricular diastolic parameters  are indeterminate.   2. Right ventricular systolic function is low normal. The right  ventricular size is mildly enlarged. There is normal pulmonary artery  systolic pressure.   3. Left atrial size was mildly dilated.   4. The mitral valve is normal in structure. Mild mitral valve  regurgitation.   5. The aortic valve is tricuspid. Aortic valve regurgitation is not  visualized.   6. Aortic dilatation noted. There is borderline dilatation of the aortic  root, measuring 38 mm.   7. The inferior vena cava is dilated in size with <50% respiratory  variability, suggesting right atrial pressure of 15 mmHg.    04/16/2022 Long term monitor Atrial Fibrillation occurred (89% burden), ranging from 43-180 bpm (avg of 81 bpm), the longest lasting 12 days 5 hours with an avg rate of 81 bpm.   01/16/2022 TTE 1. Left ventricular ejection fraction, by estimation, is 55 to 60%. The  left ventricle has normal function. The left ventricle has no regional  wall motion abnormalities. There is mild left ventricular hypertrophy.  Left ventricular diastolic parameters  are consistent with Grade I diastolic dysfunction (impaired relaxation).   2. Right ventricular systolic function is normal. The right ventricular  size is normal.   3. The mitral valve is grossly normal. No evidence of mitral valve  regurgitation.   4. The aortic valve is calcified. Aortic valve regurgitation is not  visualized. Aortic valve sclerosis/calcification is present, without any  evidence of aortic stenosis.   5. Aortic dilatation noted. There is mild dilatation  of the ascending  aorta, measuring 43 mm.   6. The inferior  vena cava is normal in size with greater than 50%  respiratory variability, suggesting right atrial pressure of 3 mmHg.   7. Agitated saline contrast bubble study was negative, with no evidence  of any interatrial shunt.    11/23/2021 TTE 1. Left ventricular ejection fraction, by estimation, is 30 to 35%. The  left ventricle has moderately decreased function. The left ventricle  demonstrates global hypokinesis. There is severe left ventricular  hypertrophy. Left ventricular diastolic  parameters are consistent with Grade I diastolic dysfunction (impaired  relaxation).   2. Right ventricular systolic function is mildly reduced. The right  ventricular size is mildly enlarged. There is normal pulmonary artery  systolic pressure. The estimated right ventricular systolic pressure is  22.7 mmHg.   3. Left atrial size was moderately dilated.   4. The mitral valve is normal in structure. No evidence of mitral valve  regurgitation. No evidence of mitral stenosis.   5. The aortic valve is normal in structure. Aortic valve regurgitation is  not visualized. Aortic valve sclerosis is present, with no evidence of  aortic valve stenosis.   6. There is mild dilatation of the aortic root, measuring 42 mm.   7. The inferior vena cava is normal in size with greater than 50%  respiratory variability, suggesting right atrial pressure of 3 mmHg.   8. Challenging images  Patient Profile     Jeremiah Vance is a 53 y.o. 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 LVEF reduced to 30-35% in 10/2021. Recent echo 11/22/2022 showed LVEF  55-60%. - Presented 1/22 with symptoms of dry cough and shortness of breath - Echo 04/24/2023 showed LVEF 40-45% with global hypokinesis, moderate LVT, small pericardial effusion, mild mitral valve regurgitation - In the setting of chemotherapy, hypertension atrial fibrillation, and at least one week off medications - BNP 136 - Appears euvolemic on exam - Cr slight increase since yesterday 1.83>1.9 - Will discontinue IV Lasix as patient is euvolemic and kidney function not improving with diuresis - Will start Farxiga 10 mg daily - Patient previously failed MRA therapy - Will plan to discharge with PTA torsemide 10 mg as needed - Continue carvedilol 6.25 mg and irbesartan 75 mg daily  Hypertensive urgency Essential hypertension - Initial BP with systolic 170-180 mmHg, improved today with systolic 130-150 mmHg - Continue hydralazine 50 mg TID (increased yesterday 1/23) and irbesartan 75 mg daily (started yesterday 1/23)  Permanent atrial fibrillation - Appears to date back to 2024 per chart review, lost to follow up with cardiology in 2023 - Remains in rate controlled atrial fibrillation - Previously on sub-optimal dose of Eliquis 2.5 mg BID secondary to bleeding from tumors causing symptomatic anemia. Oncology gave the go ahead to increase to Eliquis 5 mg BID.   Hyperlipidemia - Most recent lipid panel 10/2022 with LDL 63 - Continue statin  CKD IIIb - Cr 1.83>1.9, Lasix held  T2DM - Management per IM  GE junction carcinoma - On maintenance chemo with 5-FU - Management per IM  Debility - Leg weakness, lives with sister who helps with medications - Able to walk with walker, denies recent falls  For questions or updates, please contact [facility] Please consult [email or web address] for contact info under        Signed, Orion Crook, PA-C  04/25/2023, 10:23 AM

## 2023-04-25 NOTE — Plan of Care (Signed)

## 2023-04-25 NOTE — Progress Notes (Signed)
Progress Note   Patient: Jeremiah Vance JXB:[id number] DOB: 1970-10-01 DOA: 04/23/2023     1 DOS: the patient was seen and examined on 04/25/2023   Brief hospital course: " Jeremiah Vance is a 53 y.o. 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  Pt was admitted to the hospital on evening of 04/23/2023 for further evaluation and management of acute on chronic HFpEF, started on diuresis with IV Lasix.  Echo from August 2024 noted EF normal at 55-60%, mild LVH, indeterminate diastolic parameters, mild MR.  Repeat Echo obtained given patient on 5-FU chemotherapy with can have cardiotoxicity including heart failure.    1/23 -- Echo now with reduced EF 40-45%, LV global hypokinesis, no noted regional WMA's, mild MR, and small pericardial effusion.  Given change in Echo findings, Cardiology consulted.   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  Less likely ischemic given no WMA's noted, no reports of chest pain. --Consult Cardiology, appreciate input --Diuresis: Stop Lasix 40 mg IV BID given Cr bumped slightly --Cardiology recommends torsemide 10 mg daily (PTA dosing) --Titrate antihypertensive regimen, optimize BP control    --Increase home hydralazine 25 >> 50 mg TID    --Added ARB - irbesartan 75 mg daily    --Added Coreg 6.25 mg PO BID    --Farxiga 10 mg daily added --Strict Io's & daily weights --Monitor renal function & electrolytes --Agree with holding further antibiotics, clinically  do not suspect pneumonia --Low sodium diet, fluid restriction --PRN bronchodilators for shortness of breath --Follow up Cardiology recommendations    History of stroke --Continue home Lipitor and Eliquis   Paroxysmal atrial fibrillation (HCC): Heart rate 90s to low 100's intermittently, overall HR controlled --On Eliquis --Telemetry monitoring --Holding off beta blocker for now given acute CHF   GE junction carcinoma [facility]): Patient is receiving maintenance chemotherapy with 5-FU, last treatment was about 10 days ago.  Last oncology office visit reviewed.   --Follow-up with Dr. Cathie Hoops as scheduled   Hyperlipidemia --Lipitor   Essential hypertension - Hypertensive urgency on admission as above, possibly hypertensive heart failure --Increased home hydralazine 25 >> 50 mg PO TID --Added irbesartan and Coreg as above --IV hydralazine PRN   Chronic kidney disease, stage 3b: Renal function stable.   Baseline creatinine 2.21 on 04/15/2023.   Creatinine 1.97 on admission >> 1.87  --Monitor renal function closely with diuresis --Renally dose meds & avoid nephrotoxins   Type II diabetes mellitus with renal manifestations [facility]): Recent A1c 6.5, well-controlled.   --Marcelline Deist resumed  --Sliding scale Novolog   Depression with anxiety --Continue home medications   Obesity (BMI 30-39.9): Body weight 111.1 kg, BMI 34.17 --Encourage losing weight --Exercise and healthy diet      Subjective: Pt was sleeping, woke to voice this AM. No family present.  Reports feeling somewhat better.  Denies shortness of breath or any other complaints.    Physical Exam: Vitals:   04/25/23 0306 04/25/23 0454 04/25/23 0753 04/25/23 1152  BP:  (!) 143/74 133/76 132/85  Pulse:  85 82 (!) 104  Resp:  18 18   Temp:  98 F (36.7 C) 97.9 F (36.6 C) 97.8 F (36.6 C)  TempSrc:   Oral   SpO2:  94% 96% 91%  Weight: 108.5 kg     Height:       General exam: sleeping, wakes to voice, no acute  distress Respiratory system: lungs clear anteriorly, normal respiratory effort. Cardiovascular system: RRR, intact pedal pulses, persistent 1+ BLE edema very slightly improved.   Gastrointestinal system: soft, NT, ND, no HSM felt, +bowel sounds. Central nervous system: A&O x 3. no gross focal neurologic deficits, normal speech Extremities: BLE venous stasis hyperpigmentation, BLE edema as above Skin: dry, intact, normal temperature Psychiatry: normal mood, flat affect    Data Reviewed:  Notable labs --  K 3.4 Bicarb 20 >> 19 Glucose 112 BUN 43 Cr 1.83 >> 1.90    ECHO 04/24/23  - EF 40-45%, global LV hypokinesis, mild MR, small pericardial effusion   Family Communication: Aunt at bedside on rounds 1/23.  None present today. Will attempt to call as time allows this afternoon.    Disposition: Status is: Inpatient Remains inpatient appropriate because: ongoing IV diuresis   Planned Discharge Destination: Home    Time spent: 42 minutes  Author: Pennie Banter, DO 04/25/2023 12:49 PM  For on call review [email or web address].

## 2023-04-25 NOTE — Progress Notes (Signed)
Heart Failure Stewardship Pharmacy Note  PCP: Debera Lat, PA-C PCP-Cardiologist: Debbe Odea, MD  HPI: Jeremiah Vance is a 53 y.o. 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 BNP on admission of 136. Chest x-ray on admission noted vascular congestion and bilateral perihilar opacities and lower lobe opacities (left greater than right) favoring asymmetric edema. Patient was off CHF medications for the week prior to admission.  Pertinent cardiac history: Echocardiogram in 06/2017 showed LVEF of 40-45%. With medical therapy, LVEF improved to 60-65% in 08/2017. LVEF reduced to 30-35% in 10/2021 due to medication non adherence. Was noted to have grade I diastolic dysfunction, and mildly reduced RV function at that time. Stress test 12/2021 was showed LVEF improved to 60%, minimal coronary calcium, and low risk. This admission, LVEF noted to be down again to 40-45% with moderate LVH, indeterminate diastolic function, small pericardial effusion, mild MR, and mildly reduced RV function. Has had multiple CVAs.  Pertinent Lab Values: Creatinine  Date Value Ref Range Status  04/15/2023 2.21 (H) 0.61 - 1.24 mg/dL Final   Creatinine, Ser  Date Value Ref Range Status  04/25/2023 1.90 (H) 0.61 - 1.24 mg/dL Final   BUN  Date Value Ref Range Status  04/25/2023 43 (H) 6 - 20 mg/dL Final  16/12/9602 29 (H) 6 - 24 mg/dL Final   Potassium  Date Value Ref Range Status  04/25/2023 3.4 (L) 3.5 - 5.1 mmol/L Final   Sodium  Date Value Ref Range Status  04/25/2023 137 135 - 145 mmol/L Final  05/23/2022 139 134 - 144 mmol/L Final   B Natriuretic Peptide  Date Value Ref Range Status  04/23/2023 136.0 (H) 0.0 - 100.0 pg/mL Final    Comment:     Performed at [facility], [address] [place], [place] [address]   Magnesium  Date Value Ref Range Status  04/25/2023 1.9 1.7 - 2.4 mg/dL Final    Comment:    Performed at [facility], [address] [place], [place] [address]   Hemoglobin A1C  Date Value Ref Range Status  02/20/2023 6.5 (A) 4.0 - 5.6 % Final   HbA1c, POC (controlled diabetic range)  Date Value Ref Range Status  06/03/2018 7.0 0.0 - 7.0 % Final   Hgb A1c MFr Bld  Date Value Ref Range Status  11/22/2022 7.0 (H) 4.8 - 5.6 % Final    Comment:    (NOTE) Pre diabetes:          5.7%-6.4%  Diabetes:              >6.4%  Glycemic control for   <7.0% adults with diabetes    TSH  Date Value Ref Range Status  11/22/2021 0.842 0.450 - 4.500 uIU/mL Final    Vital Signs: Temp:  [97 F (36.1 C)-98 F (36.7 C)] 98 F (36.7 C) (01/24 0454) Pulse Rate:  [85-100] 85 (01/24 0454) Cardiac Rhythm: Atrial fibrillation (01/23 2120) Resp:  [16-28] 18 (01/24 0454) BP: (131-153)/(74-107) 143/74 (01/24 0454) SpO2:  [93 %-94 %] 94 % (01/24 0454) Weight:  [108.5 kg (239 lb 3.2 oz)] 108.5 kg (239 lb 3.2 oz) (01/24 0306) No intake or output data in the 24 hours ending 04/25/23 0728  Current Heart Failure Medications:  Loop diuretic: furosemide 40 mg IV  q12h Beta-Blocker: carvedilol 6.25 mg BID ACEI/ARB/ARNI: irbesartan 75 mg daily MRA: none SGLT2i: Farxiga 10 mg daily Other: hydralazine 50 mg q8h  Prior to admission Heart Failure Medications:  Loop diuretic: torsemide 10 mg daily as needed Beta-Blocker: none ACEI/ARB/ARNI: none MRA: none SGLT2i: Farxiga 10 mg daily Other: hydralazine 25 mg twice daily  Assessment: 1. Acute on chronic systolic heart failure (LVEF 40-45%) with mildly reduced RV function, due to NICM. NYHA class III symptoms.  -Symptoms: Reports shortness of breath is improved. Has mild shortness of breath ambulating to the restroom. -Volume: Patient does not appear overtly  volume overloaded on exam and unable to see JVP. No notable LEE. Creatinine was elevated on admission and rose modestly with diuresis. BNP on admission was only modestly elevated. Will likely need torsemide resumed. -Hemodynamics: BP is elevated from 130s-150s/70-80s. HR stable in 80s. -BB: HR 80s on carvedilol 6.25 mg BID. -ACEI/ARB/ARNI: Continue irbesartan 75 mg daily. Benefit to afterload reduction, CHF, and CKD outweigh risks at this time. May require holding if creatinine worsens. -MRA: Spironolactone would be ideal to add given BP and K, however would recommend waiting until creatinine is trending back down. -SGLT2i: Continue Farxiga 10 mg daily -Given 5-FU treatment, patient would benefit from strict electrolyte repletion given the added risk of arrhythmia in pre-existing CHF.  Plan: 1) Medication changes recommended at this time: -None  2) Patient assistance: -Pending  3) Education: - Patient has been educated on current HF medications and potential additions to HF medication regimen - Patient verbalizes understanding that over the next few months, these medication doses may change and more medications may be added to optimize HF regimen - Patient has been educated on basic disease state pathophysiology and goals of therapy  Medication Assistance / Insurance Benefits Check: Does the patient have prescription insurance?    Type of insurance plan:  Does the patient qualify for medication assistance through manufacturers or grants? Pending   Outpatient Pharmacy: Prior to admission outpatient pharmacy: [facility]     Please do not hesitate to reach out with questions or concerns,  Enos Fling, PharmD, CPP, BCPS Heart Failure Pharmacist  Phone - [phone] 04/25/2023 9:57 AM

## 2023-04-25 NOTE — Plan of Care (Signed)

## 2023-04-25 NOTE — Progress Notes (Signed)
Introduced patient to role of Statistician. Intake questions completed. Patient with blunted, flat affect noted.  Patient lives with his sister who helps with transportation, as patient is blind in Left Eye. He denies any SDOH needs and has an active PCP.

## 2023-04-26 DIAGNOSIS — I5033 Acute on chronic diastolic (congestive) heart failure: Secondary | ICD-10-CM | POA: Diagnosis not present

## 2023-04-26 LAB — GLUCOSE, CAPILLARY
Glucose-Capillary: 117 mg/dL — ABNORMAL HIGH (ref 70–99)
Glucose-Capillary: 145 mg/dL — ABNORMAL HIGH (ref 70–99)
Glucose-Capillary: 124 mg/dL — ABNORMAL HIGH (ref 70–99)

## 2023-04-26 LAB — BASIC METABOLIC PANEL
Anion gap: 11 (ref 5–15)
BUN: 46 mg/dL — ABNORMAL HIGH (ref 6–20)
CO2: 22 mmol/L (ref 22–32)
Calcium: 8.9 mg/dL (ref 8.9–10.3)
Chloride: 103 mmol/L (ref 98–111)
Creatinine, Ser: 2.22 mg/dL — ABNORMAL HIGH (ref 0.61–1.24)
GFR, Estimated: 35 mL/min — ABNORMAL LOW (ref >60)
Glucose, Bld: 130 mg/dL — ABNORMAL HIGH (ref 70–99)
Potassium: 4 mmol/L (ref 3.5–5.1)
Sodium: 136 mmol/L (ref 135–145)

## 2023-04-26 MED ORDER — CARVEDILOL 3.125 MG PO TABS
3.1250 mg | ORAL_TABLET | Freq: Two times a day (BID) | ORAL | Status: DC
  Administered 2023-04-26 – 2023-04-27 (×2): 3.125 mg via ORAL
  Filled 2023-04-26 (×2): qty 1

## 2023-04-26 MED ORDER — HYDRALAZINE HCL 50 MG PO TABS
25.0000 mg | ORAL_TABLET | Freq: Three times a day (TID) | ORAL | Status: DC
  Administered 2023-04-26 – 2023-04-27 (×2): 25 mg via ORAL
  Filled 2023-04-26 (×2): qty 1

## 2023-04-26 NOTE — Progress Notes (Signed)
Patient Name: Jeremiah Vance Date of Encounter: 04/26/2023 [facility] Cardiologist: Debbe Odea, MD   Interval Summary  .    Feeling weak and tired.  Reports that he is below his home weight.  Still reports some shortness of breath.   Vital Signs .    Vitals:   04/26/23 0404 04/26/23 0500 04/26/23 0518 04/26/23 0828  BP: 132/87  (!) 129/94 (!) 167/99  Pulse: 91  81 94  Resp: 18   20  Temp: 98.9 F (37.2 C)   97.9 F (36.6 C)  TempSrc:    Oral  SpO2: 93%   97%  Weight:  104.9 kg    Height:        Intake/Output Summary (Last 24 hours) at 04/26/2023 1204 Last data filed at 04/25/2023 1931 Gross per 24 hour  Intake 0 ml  Output 2 ml  Net -2 ml      04/26/2023    5:00 AM 04/25/2023    3:06 AM 04/23/2023    5:10 PM  Last 3 Weights  Weight (lbs) 231 lb 4.2 oz 239 lb 3.2 oz 245 lb  Weight (kg) 104.9 kg 108.5 kg 111.131 kg      Telemetry/ECG    Atrial fibrillation.  Rate <100 bpm.  PVCs. - Personally Reviewed  Echo 04/24/23: IMPRESSIONS   1. Left ventricular ejection fraction, by estimation, is 40 to 45%. The  left ventricle has mildly decreased function. The left ventricle  demonstrates global hypokinesis. There is moderate left ventricular  hypertrophy. Left ventricular diastolic  parameters are indeterminate.   2. Right ventricular systolic function is mildly reduced. The right  ventricular size is normal.   3. Left atrial size was moderately dilated.   4. A small pericardial effusion is present.   5. The mitral valve is normal in structure. Mild mitral valve  regurgitation. No evidence of mitral stenosis.   6. The aortic valve is normal in structure. Aortic valve regurgitation is  not visualized. No aortic stenosis is present.   7. There is mild dilatation of the aortic root, measuring 41 mm. There is  mild dilatation of the ascending aorta, measuring 42 mm.   8. The inferior vena cava is normal in size with greater than 50%  respiratory  variability, suggesting right atrial pressure of 3 mmHg.    Physical Exam .    VS:  BP (!) 167/99 (BP Location: Left Arm)   Pulse 94   Temp 97.9 F (36.6 C) (Oral)   Resp 20   Ht 5\' 11"  (1.803 m)   Wt 104.9 kg   SpO2 97%   BMI 32.25 kg/m  , BMI Body mass index is 32.25 kg/m. GENERAL:  Well appearing HEENT: Pupils equal round and reactive, fundi not visualized, oral mucosa unremarkable NECK:  No jugular venous distention, waveform within normal limits, carotid upstroke brisk and symmetric, no bruits, no thyromegaly LUNGS:  Clear to auscultation bilaterally HEART:  RRR.  PMI not displaced or sustained,S1 and S2 within normal limits, no S3, no S4, no clicks, no rubs, nop murmurs ABD:  Flat, positive bowel sounds normal in frequency in pitch, no bruits, no rebound, no guarding, no midline pulsatile mass, no hepatomegaly, no splenomegaly EXT:  2 plus pulses throughout, trace LE edema, no cyanosis no clubbing SKIN:  No rashes no nodules NEURO:  Cranial nerves II through XII grossly intact, motor grossly intact throughout PSYCH:  Cognitively intact, oriented to person place and time  Assessment & Plan .     #  Acute on chronic systolic and diastolic heart failure: LVEF 40-45%.  Volume status improved on exam.  He is below his dry weight.  Intake and output is not well recorded.  However his weight is down today compared to yesterday.  At home he was taking torsemide as needed.  Okay to resume 10 mg as needed.  Recommend that he weigh himself each day and take torsemide if weight is up by 2 pounds in a day or 5 pounds over the course of a week.  He was started on Farxiga this admission.  Continue carvedilol, Farxiga, hydralazine, and irbesartan.  Not adding Entresto or MRA in the setting of labile chronic kidney disease.  # Permanent atrial fibrillation: Rates are well-controlled on carvedilol.  Continue carvedilol and Eliquis.  # Hyperlipidemia: Continue atorvastatin.   For questions  or updates, please contact [facility] Please consult [email or web address] for contact info under        Signed, Chilton Si, MD

## 2023-04-26 NOTE — Progress Notes (Signed)
Progress Note   Patient: Jeremiah Vance UJW:[id number] DOB: 1971-03-31 DOA: 04/23/2023     2 DOS: the patient was seen and examined on 04/26/2023   Brief hospital course: " Myrtle D Delman is a 53 y.o. 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  Pt was admitted to the hospital on evening of 04/23/2023 for further evaluation and management of acute on chronic HFpEF, started on diuresis with IV Lasix.  Echo from August 2024 noted EF normal at 55-60%, mild LVH, indeterminate diastolic parameters, mild MR.  Repeat Echo obtained given patient on 5-FU chemotherapy with can have cardiotoxicity including heart failure.    1/23 -- Echo now with reduced EF 40-45%, LV global hypokinesis, no noted regional WMA's, mild MR, and small pericardial effusion.  Given change in Echo findings, Cardiology consulted.   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  Less likely ischemic given no WMA's noted, no reports of chest pain. --Consult Cardiology, appreciate input --Diuresis: Stop Lasix 40 mg IV BID given Cr bumped slightly --Cardiology recommends torsemide 10 mg daily (PTA dosing) --Titrate antihypertensive regimen, optimize BP control    --Reduce hydralazine back to home dose 25 mg TID    --Added ARB - irbesartan 75 mg daily    --Added Coreg 6.25 mg PO BID >> reduce to 3.125 mg BID    --Farxiga 10 mg daily added --Strict Io's & daily weights --Monitor renal function & electrolytes --Agree with holding  further antibiotics, clinically do not suspect pneumonia --Low sodium diet, fluid restriction --PRN bronchodilators for shortness of breath --Follow up Cardiology recommendations   Orthostatic hypotension - unlikely POA but clinically undetermined. Resting BP's now controlled on regimen above, however pt's BP dropped with PT today from 108/68 seated to 72/58 standing. Pt symptomatic with dizziness and unable to tolerate mobility. --Reduced hydralazine back to home dose 25 mg --Daily orthostatic vitals to monitor --Adjust BP regimen further as needed --Cardiology updated on status    History of stroke --Continue home Lipitor and Eliquis   Paroxysmal atrial fibrillation (HCC): Heart rate 90s to low 100's intermittently, overall HR controlled --On Eliquis --Telemetry monitoring --Holding off beta blocker for now given acute CHF   GE junction carcinoma [facility]): Patient is receiving maintenance chemotherapy with 5-FU, last treatment was about 10 days ago.  Last oncology office visit reviewed.   --Follow-up with Dr. Cathie Hoops as scheduled   Hyperlipidemia --Lipitor   Essential hypertension - Hypertensive urgency on admission as above, possibly hypertensive heart failure --Increased home hydralazine 25 >> 50 mg PO TID --Added irbesartan and Coreg as above --IV hydralazine PRN   Chronic kidney disease, stage 3b: Renal function stable.   Baseline creatinine 2.21 on 04/15/2023.   Creatinine 1.97 on admission >> 1.87  --Monitor renal function closely with diuresis --Renally dose meds & avoid nephrotoxins   Type II diabetes mellitus with renal manifestations [facility]): Recent A1c 6.5, well-controlled.   --Marcelline Deist resumed  --Sliding scale Novolog   Depression with anxiety --Continue home medications   Obesity (BMI 30-39.9): Body  weight 111.1 kg, BMI 34.17 --Encourage losing weight --Exercise and healthy diet      Subjective: Pt resting in bed awake but keeps eyes closed again today.   Reports feeling tired. No other complaints.  Worked with PT and was found to be significantly orthostatic and symptomatic.   Physical Exam: Vitals:   04/26/23 0500 04/26/23 0518 04/26/23 0828 04/26/23 1354  BP:  (!) 129/94 (!) 167/99 126/87  Pulse:  81 94 89  Resp:   20   Temp:   97.9 F (36.6 C)   TempSrc:   Oral   SpO2:   97% 94%  Weight: 104.9 kg     Height:       General exam: resting awake with eyes closed, no acute distress Respiratory system: lungs clear anteriorly, normal respiratory effort. Cardiovascular system: RRR, intact pedal pulses, BLE edema trace to none.   Gastrointestinal system: soft, NT, ND Central nervous system: A&O x 3. no gross focal neurologic deficits, normal speech Extremities: BLE venous stasis hyperpigmentation, BLE edema as above Skin: dry, intact, normal temperature Psychiatry: normal mood, flat affect    Data Reviewed:  Notable labs --  K 3.4 >> 4.0 Glucose 130 BUN 46 Cr 1.83 >> 1.90 >> 2.22    ECHO 04/24/23  - EF 40-45%, global LV hypokinesis, mild MR, small pericardial effusion   Family Communication: Aunt at bedside on rounds 1/23.  None present today. Will attempt to call as time allows this afternoon.    Disposition: Status is: Inpatient Remains inpatient appropriate because: ongoing IV diuresis   Planned Discharge Destination: Home    Time spent: 42 minutes  Author: Pennie Banter, DO 04/26/2023 2:24 PM  For on call review [email or web address].

## 2023-04-26 NOTE — Evaluation (Signed)
Physical Therapy Evaluation Patient Details Name: Jeremiah Vance MRN: [id number] DOB: 22-Oct-1970 Today's Date: 04/26/2023  History of Present Illness  Pt admitted to [facility] on 04/23/23 for c/o HA, cough, congestion, SOB, and dizziness. Also endorses decreased PO intake. Work up revealed HFmrEF, Afib, and HTN. 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   Clinical Impression  Pt is a 53 year old M admitted to hospital on 04/23/23 for acute on chronic dCHF. At baseline, pt is mod I for household ambulation without AD, community ambulation with AD PRN, and ADL's. Sister assists with IADL's, transportation, and medication set up.   Pt presents with LUE weakness and limited AROM, increased pain levels, decreased activity tolerance, decreased standing balance, difficulty with word finding, and positive/symptomatic orthostatic hypotension, resulting in impaired functional mobility from baseline. Due to deficits, pt required supervision-min assist for bed mobility, supervision for transfers, and CGA with L HHA to take a few steps at bedside. Further mobility/assessment limited secondary to symptomatic hypotension, reporting mild-moderate dizziness with transition from seated>standing position (See vitals below).   Deficits limit the pt's ability to safely and independently perform ADL's, transfer, and ambulate. Pt will benefit from acute skilled PT services to address deficits for return to baseline function. Deficits expected to improve with resolution of orthostatic hypotension. Pt will benefit from post acute therapy services to address deficits for return to baseline function.  Orthostatics: Seated: 108/68 mmHg (82 MAP), 77 bpm Standing: 72/58 mmHg (64 MAP), 83 bpm - experiencing mild-moderate dizziness, tolerating static  standing balance for < 30sec.         If plan is discharge home, recommend the following: A little help with walking and/or transfers;A little help with bathing/dressing/bathroom;Assist for transportation;Help with stairs or ramp for entrance;Assistance with cooking/housework     Equipment Recommendations None recommended by PT     Functional Status Assessment Patient has had a recent decline in their functional status and demonstrates the ability to make significant improvements in function in a reasonable and predictable amount of time.     Precautions / Restrictions Precautions Precautions: Fall Restrictions Weight Bearing Restrictions Per Provider Order: No Other Position/Activity Restrictions: SpO2 >/= 92%      Mobility  Bed Mobility Overal bed mobility: Needs Assistance Bed Mobility: Supine to Sit, Sit to Supine     Supine to sit: Supervision Sit to supine: Min assist   General bed mobility comments: Supervision for safety to sit EOB, HOB slightly elevated, use of BUE for support. Min assist for BLE facilitation onto bed. Verbal cues for safety and sequencing.    Transfers Overall transfer level: Needs assistance Equipment used: Rolling walker (2 wheels) Transfers: Sit to/from Stand Sit to Stand: Supervision           General transfer comment: Grossly supervision to perform multiple STS transfers from EOB with RW, demonstrating good safety awareness with hand placement. Mild-moderate dizziness with transition from sit>stand; orthostatics assessed. Able to perform multiple seated lateral scoots towards HOB for repositionining. Increased time/effort due to LUE weakness.    Ambulation/Gait Ambulation/Gait assistance: Contact guard assist Gait Distance (Feet): 2 Feet           General Gait Details: Able to take multiple short shuffled steps laterally towards [facility] for repositioining with L HHA. Limited secondary to dizziness from positive orthostatics.  Balance Overall balance assessment: Needs assistance Sitting-balance support: Bilateral upper extremity supported, Feet supported Sitting balance-Leahy Scale: Good     Standing balance support: During functional activity, Reliant on assistive device for balance, Bilateral upper extremity supported Standing balance-Leahy Scale: Fair                               Pertinent Vitals/Pain Pain Assessment Pain Assessment: 0-10 Pain Score: 3  Pain Location: bilateral knees Pain Intervention(s): Monitored during session    Home Living Family/patient expects to be discharged to:: Private residence Living Arrangements: Other relatives (lives with sister) Available Help at Discharge: Family;Available PRN/intermittently (sister works during the day; has other people who can assist him as needed) Type of Home: House (townhome) Home Access: Stairs to enter Entrance Stairs-Rails: Can reach both Entrance Stairs-Number of Steps: 1 small step-up into house Alternate Level Stairs-Number of Steps: flight Home Layout: Two level;1/2 bath on main level;Bed/bath upstairs Home Equipment: Shower seat - built [employer] fundraiser (2 wheels);Wheelchair - manual Additional Comments: Pt stays downstairs during the day while sister is at work. Has 1/2 bath downstairs and full bed/bath upstairs. Able to sleep downstairs on recliner if needed.    Prior Function Prior Level of Function : History of Falls (last six months)             Mobility Comments: mod I for stair navigation, ambulation without AD (PRN support from walls/furniture), PRN use of AD with community ambulation. ADLs Comments: IND with ADL's; sister assists with IADL's, transportation, and set up assist for medication management     Extremity/Trunk Assessment   Upper Extremity Assessment Upper Extremity Assessment: Overall WFL for tasks assessed;LUE deficits/detail LUE Deficits / Details: limited shoulder flexion to ~100deg  (needs shoulder surgery); generalized weakness at 3+/5. Sensation intact.    Lower Extremity Assessment Lower Extremity Assessment: Overall WFL for tasks assessed (grossly 4/5; limited hip AROM bilaterally secondary to habitus. Sensation intact)       Communication   Communication Communication:  (word finding difficulties; notes difficulty with speech) Cueing Techniques: Verbal cues;Tactile cues;Visual cues  Cognition Arousal: Alert Behavior During Therapy: WFL for tasks assessed/performed Overall Cognitive Status: Within Functional Limits for tasks assessed                                          General Comments General comments (skin integrity, edema, etc.): skin discoloration noted to BLE; increased edema of +1 in bilateral ankles. Scabbing to UE.    Exercises Other Exercises Other Exercises: Educated re: PT role/POC, DC recommendations, safety with functional mobility, positive orthostatics, HOB elevated for BP normalization, OOB to chair. He verbalized understanding.   Assessment/Plan    PT Assessment Patient needs continued PT services  PT Problem List Decreased strength;Decreased range of motion;Decreased activity tolerance;Decreased balance;Decreased mobility;Obesity;Decreased skin integrity;Pain       PT Treatment Interventions DME instruction;Gait training;Functional mobility training;Stair training;Therapeutic activities;Balance training;Therapeutic exercise;Neuromuscular re-education    PT Goals (Current goals can be found in the Care Plan section)  Acute Rehab PT Goals Patient Stated Goal: "go home" PT Goal Formulation: With patient Time For Goal Achievement: 05/10/23 Potential to Achieve Goals: Good    Frequency Min 1X/week        AM-PAC PT "6 Clicks" Mobility  Outcome Measure Help needed turning from your back to your side while  in a flat bed without using bedrails?: A Little Help needed moving from lying on your back to sitting on  the side of a flat bed without using bedrails?: A Little Help needed moving to and from a bed to a chair (including a wheelchair)?: A Little Help needed standing up from a chair using your arms (e.g., wheelchair or bedside chair)?: A Little Help needed to walk in hospital room?: A Little Help needed climbing 3-5 steps with a railing? : A Lot 6 Click Score: 17    End of Session Equipment Utilized During Treatment: Gait belt Activity Tolerance: Treatment limited secondary to medical complications (Comment) Patient left: in bed;with bed alarm set;with call bell/phone within reach Nurse Communication: Mobility status (orthostatics) PT Visit Diagnosis: Unsteadiness on feet (R26.81);Muscle weakness (generalized) (M62.81);History of falling (Z91.81);Pain Pain - Right/Left: Left Pain - part of body: Shoulder    Time: 1610-9604 PT Time Calculation (min) (ACUTE ONLY): 33 min   Charges:   PT Evaluation $PT Eval Low Complexity: 1 Low PT Treatments $Therapeutic Activity: 8-22 mins PT General Charges $$ ACUTE PT VISIT: 1 Visit        Vira Blanco, PT, DPT 2:25 PM,04/26/23 Physical Therapist - [facility]

## 2023-04-27 DIAGNOSIS — I5033 Acute on chronic diastolic (congestive) heart failure: Secondary | ICD-10-CM | POA: Diagnosis not present

## 2023-04-27 LAB — BASIC METABOLIC PANEL
Anion gap: 11 (ref 5–15)
BUN: 46 mg/dL — ABNORMAL HIGH (ref 6–20)
CO2: 20 mmol/L — ABNORMAL LOW (ref 22–32)
Calcium: 8.7 mg/dL — ABNORMAL LOW (ref 8.9–10.3)
Chloride: 107 mmol/L (ref 98–111)
Creatinine, Ser: 2.11 mg/dL — ABNORMAL HIGH (ref 0.61–1.24)
GFR, Estimated: 37 mL/min — ABNORMAL LOW (ref >60)
Glucose, Bld: 105 mg/dL — ABNORMAL HIGH (ref 70–99)
Potassium: 3.7 mmol/L (ref 3.5–5.1)
Sodium: 138 mmol/L (ref 135–145)

## 2023-04-27 LAB — CBC
HCT: 34.7 % — ABNORMAL LOW (ref 39.0–52.0)
Hemoglobin: 11.3 g/dL — ABNORMAL LOW (ref 13.0–17.0)
MCH: 28.3 pg (ref 26.0–34.0)
MCHC: 32.6 g/dL (ref 30.0–36.0)
MCV: 86.8 fL (ref 80.0–100.0)
Platelets: 197 10*3/uL (ref 150–400)
RBC: 4 MIL/uL — ABNORMAL LOW (ref 4.22–5.81)
RDW: 17.2 % — ABNORMAL HIGH (ref 11.5–15.5)
WBC: 4.6 10*3/uL (ref 4.0–10.5)
nRBC: 0 % (ref 0.0–0.2)

## 2023-04-27 LAB — GLUCOSE, CAPILLARY
Glucose-Capillary: 105 mg/dL — ABNORMAL HIGH (ref 70–99)
Glucose-Capillary: 139 mg/dL — ABNORMAL HIGH (ref 70–99)
Glucose-Capillary: 186 mg/dL — ABNORMAL HIGH (ref 70–99)
Glucose-Capillary: 141 mg/dL — ABNORMAL HIGH (ref 70–99)

## 2023-04-27 MED ORDER — SODIUM CHLORIDE 0.9 % IV BOLUS
500.0000 mL | Freq: Once | INTRAVENOUS | Status: AC
  Administered 2023-04-27: 500 mL via INTRAVENOUS

## 2023-04-27 MED ORDER — METOPROLOL TARTRATE 25 MG PO TABS
12.5000 mg | ORAL_TABLET | Freq: Two times a day (BID) | ORAL | Status: DC
  Administered 2023-04-27 – 2023-04-28 (×3): 12.5 mg via ORAL
  Filled 2023-04-27 (×3): qty 1

## 2023-04-27 NOTE — Progress Notes (Signed)
Progress Note   Patient: Jeremiah Vance ZOX:[id number] DOB: 19-Jun-1970 DOA: 04/23/2023     3 DOS: the patient was seen and examined on 04/27/2023   Brief hospital course: " Mauro D Passage is a 53 y.o. 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  Pt was admitted to the hospital on evening of 04/23/2023 for further evaluation and management of acute on chronic HFpEF, started on diuresis with IV Lasix.  Echo from August 2024 noted EF normal at 55-60%, mild LVH, indeterminate diastolic parameters, mild MR.  Repeat Echo obtained given patient on 5-FU chemotherapy with can have cardiotoxicity including heart failure.    1/23 -- Echo now with reduced EF 40-45%, LV global hypokinesis, no noted regional WMA's, mild MR, and small pericardial effusion.  Given change in Echo findings, Cardiology consulted.   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  Less likely ischemic given no WMA's noted, no reports of chest pain. --Consult Cardiology, appreciate input --Diuresis: Stop Lasix 40 mg IV BID given Cr bumped slightly --Cardiology recommends torsemide 10 mg daily (PTA dosing) --Titrate antihypertensive regimen, optimize BP control    --Stop hydralazine    --Continue irbesartan 75 mg daily    --Change Coreg 3.125 mg PO BID >> Lopressor 12.5 mg PO BID for less BP lowering effect    --Farxiga 10 mg daily added    --Torsemide 10 mg PO daily - ON HOLD --Strict Io's & daily weights --Monitor renal  function & electrolytes --Low sodium diet, fluid restriction --PRN bronchodilators for shortness of breath --Follow up Cardiology recommendations   Orthostatic hypotension - unlikely POA but clinically undetermined. 1/25 -- Resting BP's now controlled on regimen above, however pt's BP dropped with PT today from 108/68 seated to 72/58 standing. Pt symptomatic with dizziness and unable to tolerate mobility. 1/26 -- still + Orthostatics again with BP drop to 83 systolic. Discussed with Cardiology, updated. --Will stop hydralazine --Change Coreg to Lopressor 12.5 mg PO BID --Daily orthostatic vitals to monitor --Adjust BP regimen further as needed --Cardiology updated on status    History of stroke --Continue home Lipitor and Eliquis   Paroxysmal atrial fibrillation (HCC): Heart rate 90s to low 100's intermittently, overall HR controlled --On Eliquis --Telemetry monitoring --Holding off beta blocker for now given acute CHF   GE junction carcinoma [facility]): Patient is receiving maintenance chemotherapy with 5-FU, last treatment was about 10 days ago.  Last oncology office visit reviewed.   --Follow-up with Dr. Cathie Hoops as scheduled   Hyperlipidemia --Lipitor   Essential hypertension - Hypertensive urgency on admission as above, possibly hypertensive heart failure --Increased home hydralazine 25 >> 50 mg PO TID --Added irbesartan and Coreg as above --IV hydralazine PRN   Chronic kidney disease, stage 3b: Renal function stable.   Baseline creatinine 2.21 on 04/15/2023.   Creatinine 1.97 on admission >> 1.87  --Monitor renal function closely with diuresis --Renally dose meds & avoid nephrotoxins   Type II diabetes mellitus with renal manifestations [facility]): Recent A1c 6.5, well-controlled.   --Marcelline Deist resumed  --  Sliding scale Novolog   Depression with anxiety --Continue home medications   Obesity (BMI 30-39.9): Body weight 111.1 kg, BMI 34.17 --Encourage losing weight --Exercise and  healthy diet      Subjective: Pt seated edge of bed, just completed orthostatic BP's and dropped again today, states he got quite dizzy again.  He denies other acute complaints.     Physical Exam: Vitals:   04/27/23 0500 04/27/23 0756 04/27/23 1116 04/27/23 1116  BP:  131/84 (!) 140/78 (!) 140/78  Pulse:  100 67 67  Resp:  15 19   Temp:  97.7 F (36.5 C) 97.6 F (36.4 C) 97.6 F (36.4 C)  TempSrc:  Oral Oral Oral  SpO2:  97% 96% 96%  Weight: 104.5 kg     Height:       General exam: resting awake with eyes closed, no acute distress Respiratory system: lungs clear bilaterally no wheezes or rhonchi, normal respiratory effort. Cardiovascular system: RRR, resolved BLE edema Gastrointestinal system: soft, NT, ND Central nervous system: A&O x 3. no gross focal neurologic deficits, normal speech Extremities: BLE venous stasis hyperpigmentation, resolved BLE edema Skin: dry, intact, normal temperature Psychiatry: normal mood, flat affect    Data Reviewed:  Notable labs --  Glucose 105 BUN 46 Cr 1.83 >> 1.90 >> 2.22 >> 2.11 Hbg 11.3 stable   ECHO 04/24/23  - EF 40-45%, global LV hypokinesis, mild MR, small pericardial effusion   Family Communication:  Aunt at bedside on rounds 1/23.   Called sister this afternoon 1/25 and updated by phone.    Disposition: Status is: Inpatient Remains inpatient appropriate because: ongoing IV diuresis   Planned Discharge Destination: Home    Time spent: 42 minutes  Author: Pennie Banter, DO 04/27/2023 12:38 PM  For on call review [email or web address].

## 2023-04-27 NOTE — Progress Notes (Signed)
Patient Name: Jeremiah Vance Date of Encounter: 04/27/2023 [facility] Cardiologist: Debbe Odea, MD   Interval Summary  .    Patient seen on AM rounds. Denies any chest pain or worsening shortness of breath. States that he still does not feel like he is back to his baseline with his breathing.  States that his room was changed several times last evening and he did not sleep well  Vital Signs .    Vitals:   04/27/23 0027 04/27/23 0408 04/27/23 0500 04/27/23 0756  BP: 128/82 (!) 136/97  131/84  Pulse: 74 90  100  Resp: 20 18  15   Temp: 98.8 F (37.1 C) 98.8 F (37.1 C)  97.7 F (36.5 C)  TempSrc:  Oral  Oral  SpO2: 95% 94%  97%  Weight:   104.5 kg   Height:        Intake/Output Summary (Last 24 hours) at 04/27/2023 0949 Last data filed at 04/26/2023 1912 Gross per 24 hour  Intake 240 ml  Output --  Net 240 ml      04/27/2023    5:00 AM 04/26/2023    5:00 AM 04/25/2023    3:06 AM  Last 3 Weights  Weight (lbs) 230 lb 6.1 oz 231 lb 4.2 oz 239 lb 3.2 oz  Weight (kg) 104.5 kg 104.9 kg 108.5 kg      Telemetry/ECG    Rate controlled atrial fibrillation 80-90 with PVCs versus aberrant conducted beats, patient did have a burst of atrial fibrillation RVR with rates of 150 this morning when he was getting up to the restroom- Personally Reviewed  Physical Exam .   GEN: No acute distress.   Neck: No JVD Cardiac: IR IR, no murmurs, rubs, or gallops.  Respiratory: Clear to auscultation bilaterally.  Respirations are nonlabored at rest on room air GI: Soft, nontender, obese, non-distended  MS: Trace pretibial edema  Assessment & Plan .     Acute on chronic HFmrEF -LVEF 40-45% -Volume status continues to improve on exam -Continues to remain below his dry weight -I's and O's not accurately been recorded -Continued on carvedilol, Farxiga, hydralazine, and irbesartan -Not on Arni or MRA therapy in the setting of labile kidney disease -Prior to discharge can  resume 10 mg of torsemide -Continue with heart failure education -Daily weights, I's and O's, low-sodium diet  Essential hypertension -Blood pressure 131/84 -Adjustments were made into medication regimen yesterday with no changes needed today -Vital signs per unit protocol  Permanent atrial fibrillation -Continues to remain in rate controlled atrial fibrillation -Continued on carvedilol -Continued on apixaban 5 mg twice daily for CHA2DS2-VASc score of at least 6 -Continue with telemetry monitoring  Hyperlipidemia -Continued on atorvastatin  CKD stage IIIb -Serum creatinine 2.11 -Improvement from yesterday -Continue to monitor urine output -Monitor/trend/replete electrolytes as needed -Continued on current medication regimen -Daily BMP -Avoid nephrotoxic agents were able  Type 2 diabetes -Continued on insulin therapy -Continue management per IM  GE junction carcinoma -On maintenance chemo with 5-FU -Continue management per IM  Debility -Continues to have leg weakness -Ambulates with walker   CHA2DS2-VASc Score = 6   This indicates a 9.7% annual risk of stroke. The patient's score is based upon: CHF History: 1 HTN History: 1 Diabetes History: 1 Stroke History: 2 Vascular Disease History: 1 Age Score: 0 Gender Score: 0     For questions or updates, please contact  [facility] Please consult [email or web address] for contact info under  Signed, Dorette Hartel, NP

## 2023-04-27 NOTE — Progress Notes (Signed)
Telemetry called about patients HR in afib RVR for a minute at 0752 and the rate increasing to 150, patients HR now in the 90s. MD notified and order placed for telemetry. At the time patient was getting up from the bed.

## 2023-04-27 NOTE — Plan of Care (Signed)

## 2023-04-28 ENCOUNTER — Other Ambulatory Visit: Payer: Self-pay | Admitting: Oncology

## 2023-04-28 ENCOUNTER — Telehealth: Payer: Self-pay | Admitting: Oncology

## 2023-04-28 ENCOUNTER — Other Ambulatory Visit: Payer: Self-pay

## 2023-04-28 DIAGNOSIS — C160 Malignant neoplasm of cardia: Secondary | ICD-10-CM

## 2023-04-28 DIAGNOSIS — I5033 Acute on chronic diastolic (congestive) heart failure: Secondary | ICD-10-CM | POA: Diagnosis not present

## 2023-04-28 LAB — CULTURE, BLOOD (ROUTINE X 2)
Culture: NO GROWTH
Culture: NO GROWTH

## 2023-04-28 LAB — GLUCOSE, CAPILLARY
Glucose-Capillary: 94 mg/dL (ref 70–99)
Glucose-Capillary: 163 mg/dL — ABNORMAL HIGH (ref 70–99)

## 2023-04-28 MED ORDER — LOSARTAN POTASSIUM 25 MG PO TABS
25.0000 mg | ORAL_TABLET | Freq: Every day | ORAL | 1 refills | Status: DC
  Filled 2023-04-28: qty 30, 30d supply, fill #0

## 2023-04-28 MED ORDER — ENSURE ENLIVE PO LIQD: 237.0000 mL | Freq: Two times a day (BID) | ORAL | Status: DC

## 2023-04-28 MED ORDER — APIXABAN 5 MG PO TABS
5.0000 mg | ORAL_TABLET | Freq: Two times a day (BID) | ORAL | 1 refills | Status: DC
  Filled 2023-04-28: qty 60, 30d supply, fill #0

## 2023-04-28 MED ORDER — METOPROLOL TARTRATE 25 MG PO TABS
12.5000 mg | ORAL_TABLET | Freq: Two times a day (BID) | ORAL | 1 refills | Status: DC
  Filled 2023-04-28: qty 60, 60d supply, fill #0

## 2023-04-28 MED ORDER — IRBESARTAN 75 MG PO TABS
75.0000 mg | ORAL_TABLET | Freq: Every day | ORAL | 1 refills | Status: DC
  Filled 2023-04-28: qty 30, 30d supply, fill #0

## 2023-04-28 NOTE — Discharge Instructions (Signed)
Your nurse navigator Sheccid Lahmann can be reached at [phone]

## 2023-04-28 NOTE — Discharge Summary (Incomplete)
Physician Discharge Summary   Patient: Jeremiah Vance MRN: [id number] DOB: July 05, 1970  Admit date:     04/23/2023  Discharge date: 04/28/2023  Discharge Physician: Pennie Banter   PCP: Debera Lat, PA-C   Recommendations at discharge:  {Tip this will not be part of the note when signed- Example include specific recommendations for outpatient follow-up, pending tests to follow-up on. (Optional):26781}  Follow up as scheduled with Cardiology Follow up with Primary Care in 1-2 weeks Repeat CBC, BMP, Mg at follow up Follow up as scheduled with Oncology and for chemotherapy appointments  Discharge Diagnoses: Principal Problem:   Acute on chronic diastolic CHF (congestive heart failure) (HCC) Active Problems:   SOB (shortness of breath)   History of stroke   Paroxysmal atrial fibrillation (HCC)   GE junction carcinoma (HCC)   Hyperlipidemia   Acute on chronic combined systolic and diastolic CHF (congestive heart failure) (HCC)   Essential hypertension   Chronic kidney disease, stage 3b (HCC)   Type II diabetes mellitus with renal manifestations (HCC)   Depression with anxiety   Obesity (BMI 30-39.9)   Permanent atrial fibrillation (HCC)  Resolved Problems:   * No resolved hospital problems. [facility] Course: No notes on file  Assessment and Plan: No notes have been filed under this hospital service. Service: Hospitalist     {Tip this will not be part of the note when signed Body mass index is 32.59 kg/m. , ,  (Optional):26781}  {(NOTE) Pain control PDMP Statment (Optional):26782} Consultants: *** Procedures performed: ***  Disposition: {Plan; Disposition:26390} Diet recommendation:  {Diet_Plan:26776} DISCHARGE MEDICATION: Allergies as of 04/28/2023       Reactions   Pollen Extract Other (See Comments)   Sulfa Antibiotics      Med Rec must be completed prior to using this [facility]***       Discharge Exam: Filed Weights   04/26/23 0500 04/27/23  0500 04/28/23 0218  Weight: 104.9 kg 104.5 kg 106 kg   ***  Condition at discharge: {DC Condition:26389}  The results of significant diagnostics from this hospitalization (including imaging, microbiology, ancillary and laboratory) are listed below for reference.   Imaging Studies: ECHOCARDIOGRAM COMPLETE Result Date: 04/24/2023    ECHOCARDIOGRAM REPORT   Patient Name:   Jeremiah Vance Date of Exam: 04/24/2023 Medical Rec #:  [id number]       Height:       71.0 in Accession #:    [id number]      Weight:       245.0 lb Date of Birth:  [birth year]       BSA:          2.298 m Patient Age:    53 years        BP:           157/91 mmHg Patient Gender: M               HR:           90 bpm. Exam Location:  [facility] Procedure: 2D Echo, Cardiac Doppler and Color Doppler Indications:     CHF  History:         Patient has prior history of Echocardiogram examinations, most                  recent 11/22/2022. CHF, Stroke, Arrythmias:Atrial Fibrillation,                  Signs/Symptoms:Fatigue and Shortness of Breath; Risk  Factors:Hypertension, Diabetes, Dyslipidemia and Sleep Apnea.                  CKD.  Sonographer:     Mikki Harbor Referring Phys:  [id number] Tresa Endo A Chidiebere Wynn Diagnosing Phys: Julien Nordmann MD  Sonographer Comments: Patient is obese. IMPRESSIONS  1. Left ventricular ejection fraction, by estimation, is 40 to 45%. The left ventricle has mildly decreased function. The left ventricle demonstrates global hypokinesis. There is moderate left ventricular hypertrophy. Left ventricular diastolic parameters are indeterminate.  2. Right ventricular systolic function is mildly reduced. The right ventricular size is normal.  3. Left atrial size was moderately dilated.  4. A small pericardial effusion is present.  5. The mitral valve is normal in structure. Mild mitral valve regurgitation. No evidence of mitral stenosis.  6. The aortic valve is normal in structure. Aortic valve regurgitation is not  visualized. No aortic stenosis is present.  7. There is mild dilatation of the aortic root, measuring 41 mm. There is mild dilatation of the ascending aorta, measuring 42 mm.  8. The inferior vena cava is normal in size with greater than 50% respiratory variability, suggesting right atrial pressure of 3 mmHg. FINDINGS  Left Ventricle: Left ventricular ejection fraction, by estimation, is 40 to 45%. The left ventricle has mildly decreased function. The left ventricle demonstrates global hypokinesis. The left ventricular internal cavity size was normal in size. There is  moderate left ventricular hypertrophy. Left ventricular diastolic parameters are indeterminate. Right Ventricle: The right ventricular size is normal. No increase in right ventricular wall thickness. Right ventricular systolic function is mildly reduced. Left Atrium: Left atrial size was moderately dilated. Right Atrium: Right atrial size was normal in size. Pericardium: A small pericardial effusion is present. Mitral Valve: The mitral valve is normal in structure. Mild mitral annular calcification. Mild mitral valve regurgitation. No evidence of mitral valve stenosis. MV peak gradient, 4.4 mmHg. The mean mitral valve gradient is 2.0 mmHg. Tricuspid Valve: The tricuspid valve is normal in structure. Tricuspid valve regurgitation is not demonstrated. No evidence of tricuspid stenosis. Aortic Valve: The aortic valve is normal in structure. Aortic valve regurgitation is not visualized. No aortic stenosis is present. Aortic valve mean gradient measures 2.7 mmHg. Aortic valve peak gradient measures 5.0 mmHg. Aortic valve area, by VTI measures 3.00 cm. Pulmonic Valve: The pulmonic valve was normal in structure. Pulmonic valve regurgitation is not visualized. No evidence of pulmonic stenosis. Aorta: The aortic root is normal in size and structure. There is mild dilatation of the aortic root, measuring 41 mm. There is mild dilatation of the ascending aorta,  measuring 42 mm. Venous: The inferior vena cava is normal in size with greater than 50% respiratory variability, suggesting right atrial pressure of 3 mmHg. IAS/Shunts: No atrial level shunt detected by color flow Doppler.  LEFT VENTRICLE PLAX 2D LVIDd:         5.40 cm LVIDs:         2.70 cm LV PW:         1.50 cm LV IVS:        1.60 cm LVOT diam:     2.20 cm LV SV:         60 LV SV Index:   26 LVOT Area:     3.80 cm  RIGHT VENTRICLE RV Basal diam:  3.80 cm RV Mid diam:    3.70 cm LEFT ATRIUM             Index  RIGHT ATRIUM           Index LA diam:        4.90 cm 2.13 cm/m   RA Area:     20.10 cm LA Vol (A2C):   73.9 ml 32.15 ml/m  RA Volume:   57.10 ml  24.84 ml/m LA Vol (A4C):   97.4 ml 42.38 ml/m LA Biplane Vol: 87.3 ml 37.98 ml/m  AORTIC VALVE                    PULMONIC VALVE AV Area (Vmax):    3.17 cm     PV Vmax:       0.88 m/s AV Area (Vmean):   2.72 cm     PV Peak grad:  3.1 mmHg AV Area (VTI):     3.00 cm AV Vmax:           111.67 cm/s AV Vmean:          77.900 cm/s AV VTI:            0.200 m AV Peak Grad:      5.0 mmHg AV Mean Grad:      2.7 mmHg LVOT Vmax:         93.25 cm/s LVOT Vmean:        55.650 cm/s LVOT VTI:          0.158 m LVOT/AV VTI ratio: 0.79  AORTA Ao Root diam: 4.10 cm Ao Asc diam:  4.20 cm MITRAL VALVE MV Area (PHT): 3.65 cm    SHUNTS MV Area VTI:   2.63 cm    Systemic VTI:  0.16 m MV Peak grad:  4.4 mmHg    Systemic Diam: 2.20 cm MV Mean grad:  2.0 mmHg MV Vmax:       1.05 m/s MV Vmean:      58.7 cm/s MV Decel Time: 208 msec MV E velocity: 84.00 cm/s Julien Nordmann MD Electronically signed by Julien Nordmann MD Signature Date/Time: 04/24/2023/1:11:41 PM    Final    DG Chest 2 View Result Date: 04/23/2023 CLINICAL DATA:  Congestion, shortness of breath.  COPD EXAM: CHEST - 2 VIEW COMPARISON:  10/27/2022 FINDINGS: Right Port-A-Cath in place with the tip at the cavoatrial junction. Cardiomegaly with vascular congestion. Left pleural effusion with perihilar and lower lobe  airspace opacities, left greater than right. No acute bony abnormality. IMPRESSION: Cardiomegaly with vascular congestion and bilateral perihilar opacities and lower lobe opacities, left greater than right. Favor asymmetric edema. Cannot exclude pneumonia. Small to moderate left pleural effusion. Electronically Signed   By: Charlett Nose M.D.   On: 04/23/2023 17:40   CT CHEST ABDOMEN PELVIS WO CONTRAST Result Date: 04/15/2023 CLINICAL DATA:  Restaging gastric cancer EXAM: CT CHEST, ABDOMEN AND PELVIS WITHOUT CONTRAST TECHNIQUE: Multidetector CT imaging of the chest, abdomen and pelvis was performed following the standard protocol without IV contrast. RADIATION DOSE REDUCTION: This exam was performed according to the departmental dose-optimization program which includes automated exposure control, adjustment of the mA and/or kV according to patient size and/or use of iterative reconstruction technique. COMPARISON:  PET-CT dated 12/31/2022 FINDINGS: CT CHEST FINDINGS Cardiovascular: Mild cardiomegaly.  Small pericardial effusion, new. No evidence of thoracic aortic aneurysm. Atherosclerotic calcifications of the aortic arch. Mild coronary atherosclerosis of the LAD and right coronary artery. Right chest port terminates at the cavoatrial junction. Mediastinum/Nodes: Dominant 12 mm short axis AP window node, similar. However, additional mediastinal nodes are favored to be mildly progressive, including  a 2 mm short axis low right paratracheal node (series 2/image 23) and new prevascular nodes measuring up to 7 mm short axis (series 2/image 21). Visualized thyroid is unremarkable. Lungs/Pleura: 5 mm triangular subpleural nodule in the right lower lobe (series 4/image 109), unchanged, likely benign. Additional subpleural nodularity in the right upper and right middle lobes, measuring up to 5 mm, benign. No new/suspicious pulmonary nodules. Mild centrilobular emphysematous changes, upper lung predominant. Moderate left  and small to moderate right pleural effusions, new. Associated lingular and left lower lobe compressive atelectasis. No pneumothorax. Musculoskeletal: Degenerative changes of the lower thoracic spine. CT ABDOMEN PELVIS FINDINGS Hepatobiliary: Unenhanced liver is notable for a mildly macronodular contour but no focal hepatic lesion. Cholelithiasis, without associated inflammatory changes. No intrahepatic or extrahepatic duct dilatation. Pancreas: Within normal limits. Spleen: Within normal limits. Adrenals/Urinary Tract: Adrenal glands are within normal limits. Kidneys are within normal limits. Renal vascular calcifications. Mild left hydronephrosis with narrowing at the UVJ. Thick-walled bladder, although underdistended. 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 No evidence of bowel obstruction. Appendix is not discretely visualized. Sigmoid diverticulosis, without evidence of diverticulitis. Large complex midline lower abdominal ventral hernia containing multiple loops of nondilated bowel (series 2/image 106). Vascular/Lymphatic: No evidence of abdominal aortic aneurysm. Atherosclerotic calcifications of the abdominal aorta and branch vessels, although vessels remain patent. Perigastric soft tissue implant measuring 3.2 x 2.3 cm (series 2/image 53), previously 3.7 x 2.1 cm, favored to be mildly improved. Additional small periaortic/retroperitoneal nodes measuring up to 7 mm short axis (series 2/image 89), grossly unchanged. Reproductive: Prostate is unremarkable. Other: Trace pelvic ascites, new. Musculoskeletal: Degenerative changes of the lumbar spine. 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 Mediastinal, perigastric, and retroperitoneal nodal metastases, overall grossly  unchanged, although possibly mildly progressive in the chest. Moderate left and small to moderate right pleural effusions, new. Small pericardial effusion, new. Trace pelvic ascites, new. Additional stable ancillary findings as above. Aortic Atherosclerosis (ICD10-I70.0) and Emphysema (ICD10-J43.9). Electronically Signed   By: Charline Bills M.D.   On: 04/15/2023 00:24    Microbiology: Results for orders placed or performed during the hospital encounter of 04/23/23  Resp panel by RT-PCR (RSV, Flu A&B, Covid) Anterior Nasal Swab     Status: None   Collection Time: 04/23/23  5:13 PM   Specimen: Anterior Nasal Swab  Result Value Ref Range Status   SARS Coronavirus 2 by RT PCR NEGATIVE NEGATIVE Final    Comment: (NOTE) SARS-CoV-2 target nucleic acids are NOT DETECTED.  The SARS-CoV-2 RNA is generally detectable in upper respiratory specimens during the acute phase of infection. The lowest concentration of SARS-CoV-2 viral copies this assay can detect is 138 copies/mL. A negative result does not preclude SARS-Cov-2 infection and should not be used as the sole basis for treatment or other patient management decisions. A negative result may occur with  improper specimen collection/handling, submission of specimen other than nasopharyngeal swab, presence of viral mutation(s) within the areas targeted by this assay, and inadequate number of viral copies(<138 copies/mL). A negative result must be combined with clinical observations, patient history, and epidemiological information. The expected result is Negative.  Fact Sheet for Patients:  BloggerCourse.com  Fact Sheet for Healthcare Providers:  SeriousBroker.it  This test is no t yet approved or cleared by the [place] FDA and  has been authorized for detection and/or  diagnosis of SARS-CoV-2 by FDA under an Emergency Use Authorization (EUA). This EUA will remain  in effect (meaning  this test can be used) for the duration of the COVID-19 declaration under Section 564(b)(1) of the Act, 21 U.S.C.section 360bbb-3(b)(1), unless the authorization is terminated  or revoked sooner.       Influenza A by PCR NEGATIVE NEGATIVE Final   Influenza B by PCR NEGATIVE NEGATIVE Final    Comment: (NOTE) The Xpert Xpress SARS-CoV-2/FLU/RSV plus assay is intended as an aid in the diagnosis of influenza from Nasopharyngeal swab specimens and should not be used as a sole basis for treatment. Nasal washings and aspirates are unacceptable for Xpert Xpress SARS-CoV-2/FLU/RSV testing.  Fact Sheet for Patients: BloggerCourse.com  Fact Sheet for Healthcare Providers: SeriousBroker.it  This test is not yet approved or cleared by the [place] FDA and has been authorized for detection and/or diagnosis of SARS-CoV-2 by FDA under an Emergency Use Authorization (EUA). This EUA will remain in effect (meaning this test can be used) for the duration of the COVID-19 declaration under Section 564(b)(1) of the Act, 21 U.S.C. section 360bbb-3(b)(1), unless the authorization is terminated or revoked.     Resp Syncytial Virus by PCR NEGATIVE NEGATIVE Final    Comment: (NOTE) Fact Sheet for Patients: BloggerCourse.com  Fact Sheet for Healthcare Providers: SeriousBroker.it  This test is not yet approved or cleared by the [place] FDA and has been authorized for detection and/or diagnosis of SARS-CoV-2 by FDA under an Emergency Use Authorization (EUA). This EUA will remain in effect (meaning this test can be used) for the duration of the COVID-19 declaration under Section 564(b)(1) of the Act, 21 U.S.C. section 360bbb-3(b)(1), unless the authorization is terminated or revoked.  Performed at [facility], [address] [place], [place] [address]   Culture, blood (routine x  2)     Status: None   Collection Time: 04/23/23  9:46 PM   Specimen: BLOOD  Result Value Ref Range Status   Specimen Description BLOOD RIGHT ARM  Final   Special Requests   Final    BOTTLES DRAWN AEROBIC AND ANAEROBIC Blood Culture results may not be optimal due to an inadequate volume of blood received in culture bottles   Culture   Final    NO GROWTH 5 DAYS Performed at [facility] [address] [place], [place] [address]    Report Status 04/28/2023 FINAL  Final  Culture, blood (routine x 2)     Status: None   Collection Time: 04/23/23  9:46 PM   Specimen: BLOOD  Result Value Ref Range Status   Specimen Description BLOOD LEFT FOREARM  Final   Special Requests   Final    BOTTLES DRAWN AEROBIC ONLY Blood Culture results may not be optimal due to an inadequate volume of blood received in culture bottles   Culture   Final    NO GROWTH 5 DAYS Performed at [facility], [address] [place], [place] [address]    Report Status 04/28/2023 FINAL  Final    Labs: CBC: Recent Labs  Lab 04/23/23 1713 04/24/23 0622 04/27/23 0432  WBC 6.0 7.0 4.6  HGB 12.4* 11.9* 11.3*  HCT 38.8* 36.2* 34.7*  MCV 88.2 84.8 86.8  PLT 209 214 197   Basic Metabolic Panel: Recent Labs  Lab 04/23/23 1713 04/24/23 0622 04/25/23 0521 04/26/23 0435 04/27/23 0432  NA 135 137 137 136 138  K 4.0 3.5 3.4* 4.0 3.7  CL 108 105  105 103 107  CO2 18* 20* 19* 22 20*  GLUCOSE 140* 122* 112* 130* 105*  BUN 53* 48* 43* 46* 46*  CREATININE 1.97* 1.83* 1.90* 2.22* 2.11*  CALCIUM 8.6* 8.6* 9.0 8.9 8.7*  MG  --   --  1.9  --   --    Liver Function Tests: No results for input(s): "AST", "ALT", "ALKPHOS", "BILITOT", "PROT", "ALBUMIN" in the last 168 hours. CBG: Recent Labs  Lab 04/27/23 0815 04/27/23 1116 04/27/23 1634 04/27/23 1954 04/28/23 0738  GLUCAP 105* 139* 186* 141* 94    Discharge time spent: {LESS THAN/GREATER ZOXW:96045} 30 minutes.  Signed: Pennie Banter,  DO [facility] 04/28/2023

## 2023-04-28 NOTE — Plan of Care (Signed)
  Problem: Education: Goal: Ability to describe self-care measures that may prevent or decrease complications (Diabetes Survival Skills Education) will improve Outcome: Progressing Goal: Individualized Educational Video(s) Outcome: Progressing   Problem: Coping: Goal: Ability to adjust to condition or change in health will improve Outcome: Progressing   Problem: Fluid Volume: Goal: Ability to maintain a balanced intake and output will improve Outcome: Progressing   Problem: Health Behavior/Discharge Planning: Goal: Ability to identify and utilize available resources and services will improve Outcome: Progressing Goal: Ability to manage health-related needs will improve Outcome: Progressing   Problem: Metabolic: Goal: Ability to maintain appropriate glucose levels will improve Outcome: Progressing   Problem: Nutritional: Goal: Maintenance of adequate nutrition will improve Outcome: Progressing Goal: Progress toward achieving an optimal weight will improve Outcome: Progressing   Problem: Skin Integrity: Goal: Risk for impaired skin integrity will decrease Outcome: Progressing   Problem: Tissue Perfusion: Goal: Adequacy of tissue perfusion will improve Outcome: Progressing   Problem: Education: Goal: Knowledge of General Education information will improve Description: Including pain rating scale, medication(s)/side effects and non-pharmacologic comfort measures Outcome: Progressing   Problem: Health Behavior/Discharge Planning: Goal: Ability to manage health-related needs will improve Outcome: Progressing   Problem: Clinical Measurements: Goal: Ability to maintain clinical measurements within normal limits will improve Outcome: Progressing Goal: Will remain free from infection Outcome: Progressing Goal: Diagnostic test results will improve Outcome: Progressing Goal: Respiratory complications will improve Outcome: Progressing Goal: Cardiovascular complication will  be avoided Outcome: Progressing   Problem: Activity: Goal: Risk for activity intolerance will decrease Outcome: Progressing   Problem: Nutrition: Goal: Adequate nutrition will be maintained Outcome: Progressing   Problem: Coping: Goal: Level of anxiety will decrease Outcome: Progressing   Problem: Elimination: Goal: Will not experience complications related to bowel motility Outcome: Progressing Goal: Will not experience complications related to urinary retention Outcome: Progressing   Problem: Pain Managment: Goal: General experience of comfort will improve and/or be controlled Outcome: Progressing   Problem: Safety: Goal: Ability to remain free from injury will improve Outcome: Progressing   Problem: Skin Integrity: Goal: Risk for impaired skin integrity will decrease Outcome: Progressing   Problem: Education: Goal: Ability to demonstrate management of disease process will improve Outcome: Progressing Goal: Ability to verbalize understanding of medication therapies will improve Outcome: Progressing Goal: Individualized Educational Video(s) Outcome: Progressing   Problem: Activity: Goal: Capacity to carry out activities will improve Outcome: Progressing   Problem: Cardiac: Goal: Ability to achieve and maintain adequate cardiopulmonary perfusion will improve Outcome: Progressing   Problem: Activity: Goal: Ability to tolerate increased activity will improve Outcome: Progressing   Problem: Clinical Measurements: Goal: Ability to maintain a body temperature in the normal range will improve Outcome: Progressing   Problem: Respiratory: Goal: Ability to maintain adequate ventilation will improve Outcome: Progressing Goal: Ability to maintain a clear airway will improve Outcome: Progressing

## 2023-04-28 NOTE — TOC Transition Note (Signed)
Transition of Care [facility]) - Discharge Note   Patient Details  Name: Jeremiah Vance MRN: [id number] Date of Birth: [birth year]  [facility]) CM/SW Contact:  Allena Katz, LCSW Phone Number: 04/28/2023, 2:26 PM   Clinical Narrative:   CSW met with pt who reports he has no DME needs and does not want a BSC. He lives at home with his sister. Csw explained unable to secure HH as [facility], [facility], [facility] all [facility]. Explained to both patient and aunt how to get PCS services through his PCP. No other needs at this time.     Final next level of care: Home/Self Care Barriers to Discharge: Barriers Resolved   Patient Goals and CMS Choice Patient states their goals for this hospitalization and ongoing recovery are:: return home   Choice offered to / list presented to : Patient      Discharge Placement                Patient to be transferred to facility by: family Name of family member notified: aunt at bedside. Patient and family notified of of transfer: 04/28/23  Discharge Plan and Services Additional resources added to the After Visit Summary for                                       Social Drivers of Health (SDOH) Interventions SDOH Screenings   Food Insecurity: No Food Insecurity (04/24/2023)  Housing: Low Risk  (04/24/2023)  Transportation Needs: No Transportation Needs (04/24/2023)  Utilities: Not At Risk (04/24/2023)  Alcohol Screen: Low Risk  (11/28/2021)  Depression (PHQ2-9): Medium Risk (02/20/2023)  Financial Resource Strain: Low Risk  (04/24/2023)  Social Connections: Unknown (04/24/2023)  Tobacco Use: Low Risk  (04/24/2023)     Readmission Risk Interventions    09/29/2022   10:04 AM  Readmission Risk Prevention Plan  Transportation Screening Complete  Medication Review (RN Care Manager) Complete  PCP or Specialist appointment within 3-5 days of discharge Complete  HRI or Home Care Consult Complete  SW  Recovery Care/Counseling Consult Complete  Palliative Care Screening Not Applicable  Skilled Nursing Facility Not Applicable

## 2023-04-28 NOTE — Telephone Encounter (Signed)
Pt is in hospital. MD updated treatment plan/ appts are r/s. I called and left a vm for pt sister about the change.

## 2023-04-28 NOTE — Progress Notes (Signed)
Rounding Note    Patient Name: Jeremiah Vance Date of Encounter: 04/28/2023  [facility] Cardiologist: Debbe Odea, MD   Subjective   Patient had an episode of lightheadedness with standing yesterday.  Orthostatics positive with BP dropping from 123/94 sitting to 93/67 standing. Hospitalist adjusted medications accordingly. Tells me he was sitting up in his chair this morning and felt somewhat lightheaded. He is asymptomatic while laying in bed.   Inpatient Medications    Scheduled Meds:  apixaban  5 mg Oral BID   atorvastatin  40 mg Oral q1800   busPIRone  7.5 mg Oral BID   dapagliflozin propanediol  10 mg Oral Daily   feeding supplement  237 mL Oral BID BM   insulin aspart  0-5 Units Subcutaneous QHS   insulin aspart  0-9 Units Subcutaneous TID WC   irbesartan  75 mg Oral Daily   metoprolol tartrate  12.5 mg Oral BID   pantoprazole  40 mg Oral BID AC   sertraline  75 mg Oral Daily   Continuous Infusions:  PRN Meds: acetaminophen, albuterol, dextromethorphan-guaiFENesin, hydrALAZINE, ondansetron (ZOFRAN) IV, oxyCODONE   Vital Signs    Vitals:   04/28/23 0218 04/28/23 0515 04/28/23 0736 04/28/23 1130  BP:  117/85 (!) 135/93 123/83  Pulse:  80 89 84  Resp:   17 17  Temp:  98 F (36.7 C) 98.4 F (36.9 C) 98.4 F (36.9 C)  TempSrc:  Oral    SpO2:  91% 93% 95%  Weight: 106 kg     Height:        Intake/Output Summary (Last 24 hours) at 04/28/2023 1222 Last data filed at 04/28/2023 1016 Gross per 24 hour  Intake 840 ml  Output --  Net 840 ml      04/28/2023    2:18 AM 04/27/2023    5:00 AM 04/26/2023    5:00 AM  Last 3 Weights  Weight (lbs) 233 lb 11 oz 230 lb 6.1 oz 231 lb 4.2 oz  Weight (kg) 106 kg 104.5 kg 104.9 kg      Telemetry    Atrial fibrillation rate 70-90 bpm - Personally Reviewed  Physical Exam   GEN: No acute distress.   Neck: No JVD Cardiac: RRR, no murmurs, rubs, or gallops.  Respiratory: Clear to auscultation  bilaterally. GI: Soft, nontender, non-distended  MS: No edema; No deformity. Neuro:  Nonfocal  Psych: Normal affect   Labs    High Sensitivity Troponin:  No results for input(s): "TROPONINIHS" in the last 720 hours.   Chemistry Recent Labs  Lab 04/25/23 0521 04/26/23 0435 04/27/23 0432  NA 137 136 138  K 3.4* 4.0 3.7  CL 105 103 107  CO2 19* 22 20*  GLUCOSE 112* 130* 105*  BUN 43* 46* 46*  CREATININE 1.90* 2.22* 2.11*  CALCIUM 9.0 8.9 8.7*  MG 1.9  --   --   GFRNONAA 42* 35* 37*  ANIONGAP 13 11 11     Lipids No results for input(s): "CHOL", "TRIG", "HDL", "LABVLDL", "LDLCALC", "CHOLHDL" in the last 168 hours.  Hematology Recent Labs  Lab 04/23/23 1713 04/24/23 0622 04/27/23 0432  WBC 6.0 7.0 4.6  RBC 4.40 4.27 4.00*  HGB 12.4* 11.9* 11.3*  HCT 38.8* 36.2* 34.7*  MCV 88.2 84.8 86.8  MCH 28.2 27.9 28.3  MCHC 32.0 32.9 32.6  RDW 16.2* 16.2* 17.2*  PLT 209 214 197   Thyroid No results for input(s): "TSH", "FREET4" in the last 168 hours.  BNP  Recent Labs  Lab 04/23/23 1713  BNP 136.0*    DDimer No results for input(s): "DDIMER" in the last 168 hours.   Radiology    No results found.  Cardiac Studies   04/24/2023 TTE 1. Left ventricular ejection fraction, by estimation, is 40 to 45%. The  left ventricle has mildly decreased function. The left ventricle  demonstrates global hypokinesis. There is moderate left ventricular  hypertrophy. Left ventricular diastolic  parameters are indeterminate.   2. Right ventricular systolic function is mildly reduced. The right  ventricular size is normal.   3. Left atrial size was moderately dilated.   4. A small pericardial effusion is present.   5. The mitral valve is normal in structure. Mild mitral valve  regurgitation. No evidence of mitral stenosis.   6. The aortic valve is normal in structure. Aortic valve regurgitation is  not visualized. No aortic stenosis is present.   7. There is mild dilatation of the aortic  root, measuring 41 mm. There is  mild dilatation of the ascending aorta, measuring 42 mm.   8. The inferior vena cava is normal in size with greater than 50%  respiratory variability, suggesting right atrial pressure of 3 mmHg.    11/22/2022 TTE 1. Left ventricular ejection fraction, by estimation, is 55 to 60%. The  left ventricle has normal function. The left ventricle has no regional  wall motion abnormalities. There is mild left ventricular hypertrophy.  Left ventricular diastolic parameters  are indeterminate.   2. Right ventricular systolic function is low normal. The right  ventricular size is mildly enlarged. There is normal pulmonary artery  systolic pressure.   3. Left atrial size was mildly dilated.   4. The mitral valve is normal in structure. Mild mitral valve  regurgitation.   5. The aortic valve is tricuspid. Aortic valve regurgitation is not  visualized.   6. Aortic dilatation noted. There is borderline dilatation of the aortic  root, measuring 38 mm.   7. The inferior vena cava is dilated in size with <50% respiratory  variability, suggesting right atrial pressure of 15 mmHg.    04/16/2022 Long term monitor Atrial Fibrillation occurred (89% burden), ranging from 43-180 bpm (avg of 81 bpm), the longest lasting 12 days 5 hours with an avg rate of 81 bpm.   01/16/2022 TTE 1. Left ventricular ejection fraction, by estimation, is 55 to 60%. The  left ventricle has normal function. The left ventricle has no regional  wall motion abnormalities. There is mild left ventricular hypertrophy.  Left ventricular diastolic parameters  are consistent with Grade I diastolic dysfunction (impaired relaxation).   2. Right ventricular systolic function is normal. The right ventricular  size is normal.   3. The mitral valve is grossly normal. No evidence of mitral valve  regurgitation.   4. The aortic valve is calcified. Aortic valve regurgitation is not  visualized. Aortic valve  sclerosis/calcification is present, without any  evidence of aortic stenosis.   5. Aortic dilatation noted. There is mild dilatation of the ascending  aorta, measuring 43 mm.   6. The inferior vena cava is normal in size with greater than 50%  respiratory variability, suggesting right atrial pressure of 3 mmHg.   7. Agitated saline contrast bubble study was negative, with no evidence  of any interatrial shunt.    11/23/2021 TTE 1. Left ventricular ejection fraction, by estimation, is 30 to 35%. The  left ventricle has moderately decreased function. The left ventricle  demonstrates global hypokinesis.  There is severe left ventricular  hypertrophy. Left ventricular diastolic  parameters are consistent with Grade I diastolic dysfunction (impaired  relaxation).   2. Right ventricular systolic function is mildly reduced. The right  ventricular size is mildly enlarged. There is normal pulmonary artery  systolic pressure. The estimated right ventricular systolic pressure is  22.7 mmHg.   3. Left atrial size was moderately dilated.   4. The mitral valve is normal in structure. No evidence of mitral valve  regurgitation. No evidence of mitral stenosis.   5. The aortic valve is normal in structure. Aortic valve regurgitation is  not visualized. Aortic valve sclerosis is present, with no evidence of  aortic valve stenosis.   6. There is mild dilatation of the aortic root, measuring 42 mm.   7. The inferior vena cava is normal in size with greater than 50%  respiratory variability, suggesting right atrial pressure of 3 mmHg.   8. Challenging images  Patient Profile     Satvik D Proffit is a 53 y.o. 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 LVEF reduced to 30-35% in 10/2021. Recent echo 11/22/2022 showed LVEF 55-60%. - Presented 1/22 with symptoms of dry cough and shortness of breath - Echo 04/24/2023 showed LVEF 40-45% with global hypokinesis, moderate LVT, small pericardial effusion, mild mitral valve regurgitation - In the setting of chemotherapy, hypertension atrial fibrillation, and at least one week off medications - BNP 136 on admission - Received IV Lasix early in admission - Volume status improved with weight less than dry weight - Cr 2.11, baseline appears to be ~1.3 - Torsemide held in the setting of worsening kidney function - Hydralazine held and carvedilol changed to metoprolol tartrate 12.5 BID in the setting of orthostatic hypotension per hospitalist - Previously failed MRA. No ARNI in the setting of labile kidney disease - Plan to consolidate metoprolol tartrate to metoprolol succinate 25 mg daily at discharge - Should resume torsemide 10 mg as needed for daily weight gain 2 lbs of weekly 5 lbs once kidney function has improved - Continue Farxiga and irbesartan  Hypertensive urgency Essential hypertension Orthostatic hypotension - Initial BP with systolic 170-180 mmHg - BP improved to 130s/70 with medication adjustments - Patient with episode of lightheadedness yesterday, orthostatics positive. Continues to experience lightheadedness today.  - Hydralazine held and carvedilol changed to metoprolol yesterday 1/26 per hospitalist - Recommend IV hydration in the setting of positive orthostatics and worsening kidney function  Permanent atrial fibrillation - Appears to date back to 2024 per chart review, lost to follow up with cardiology in 2023  - Remains in rate controlled atrial fibrillation  - CHA2DS2VASc at least 6 - Continue Eliquis 5 mg BID, dose increased from 2.5 mg BID with approval from oncology - Continue telemetry  monitoring  Hyperlipidemia - Most recent lipid panel 10/2022 with LDL 63 - Continue statin  CKD IIIb - Cr 2.11 yesterday, baseline appears to be ~1.3  T2DM - Management per IM  GE junction carcinoma - On maintenance chemo with 5-FU - Management per IM  Debility - Leg weakness, lives with sister who helps with medications - Able to walk with walker, denies recent falls  For  questions or updates, please contact [facility] Please consult [email or web address] for contact info under        Signed, Orion Crook, PA-C  04/28/2023, 12:22 PM

## 2023-04-28 NOTE — Progress Notes (Addendum)
Physical Therapy Treatment Patient Details Name: Jeremiah Vance MRN: [id number] DOB: 04-18-1970 Today's Date: 04/28/2023   History of Present Illness Pt admitted to [facility] on 04/23/23 for c/o HA, cough, congestion, SOB, and dizziness. Also endorses decreased PO intake. Work up revealed HFmrEF, Afib, and HTN. 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    PT Comments  OOb for orthostatics.  Pt remains symptomatic and unable to remains standing for 3 minute reading today.  Supine 136/88 P 80 map 103 , sit 123/94 P 108 map 103, stand 93/67 P 62 map 77.  After short seated rest BP increases to 132/90 P 90 map 103.  Pt does opt to transfer to recliner for breakfast with needs and safety met.  Discussed with RN and documented in flow sheets. Does state when he gets dizzy at home on occasion he will sit until it passes.   If plan is discharge home, recommend the following: A little help with walking and/or transfers;A little help with bathing/dressing/bathroom;Assist for transportation;Help with stairs or ramp for entrance;Assistance with cooking/housework   Can travel by private vehicle        Equipment Recommendations  None recommended by PT    Recommendations for Other Services       Precautions / Restrictions Precautions Precautions: Fall Precaution Comments: orthostatic Restrictions Weight Bearing Restrictions Per Provider Order: No     Mobility  Bed Mobility Overal bed mobility: Needs Assistance Bed Mobility: Supine to Sit     Supine to sit: Min assist          Transfers Overall transfer level: Needs assistance Equipment used: Rolling walker (2 wheels) Transfers: Sit to/from Stand Sit to Stand: Contact guard assist           General transfer comment: dizziness upon standing but able  to obtain balance with supervision    Ambulation/Gait Ambulation/Gait assistance: Contact guard assist Gait Distance (Feet): 2 Feet Assistive device: Rolling walker (2 wheels) Gait Pattern/deviations: Step-to pattern       General Gait Details: steps to recliner after seated rest   Stairs             Wheelchair Mobility     Tilt Bed    Modified Rankin (Stroke Patients Only)       Balance Overall balance assessment: Needs assistance Sitting-balance support: Bilateral upper extremity supported, Feet supported Sitting balance-Leahy Scale: Good     Standing balance support: During functional activity, Reliant on assistive device for balance, Bilateral upper extremity supported Standing balance-Leahy Scale: Fair                              Cognition Arousal: Alert Behavior During Therapy: WFL for tasks assessed/performed Overall Cognitive Status: Within Functional Limits for tasks assessed                                          Exercises      General Comments        Pertinent Vitals/Pain Pain Assessment Pain Assessment: No/denies pain    Home Living  Prior Function            PT Goals (current goals can now be found in the care plan section) Progress towards PT goals: Progressing toward goals    Frequency    Min 1X/week      PT Plan      Co-evaluation              AM-PAC PT "6 Clicks" Mobility   Outcome Measure  Help needed turning from your back to your side while in a flat bed without using bedrails?: A Little Help needed moving from lying on your back to sitting on the side of a flat bed without using bedrails?: A Little Help needed moving to and from a bed to a chair (including a wheelchair)?: A Little Help needed standing up from a chair using your arms (e.g., wheelchair or bedside chair)?: A Little Help needed to walk in hospital room?: A Little Help  needed climbing 3-5 steps with a railing? : A Lot 6 Click Score: 17    End of Session Equipment Utilized During Treatment: Gait belt Activity Tolerance: Treatment limited secondary to medical complications (Comment) Patient left: in chair;with call bell/phone within reach;with chair alarm set Nurse Communication: Mobility status PT Visit Diagnosis: Unsteadiness on feet (R26.81);Muscle weakness (generalized) (M62.81);History of falling (Z91.81);Pain Pain - Right/Left: Left Pain - part of body: Shoulder     Time: 0812-0825 PT Time Calculation (min) (ACUTE ONLY): 13 min  Charges:    $Therapeutic Activity: 8-22 mins PT General Charges $$ ACUTE PT VISIT: 1 Visit                   Danielle Dess, PTA 04/28/23, 8:32 AM

## 2023-04-29 ENCOUNTER — Inpatient Hospital Stay: Payer: Medicaid Other

## 2023-04-29 ENCOUNTER — Ambulatory Visit: Payer: Medicaid Other

## 2023-04-29 ENCOUNTER — Other Ambulatory Visit: Payer: Self-pay

## 2023-04-29 ENCOUNTER — Ambulatory Visit: Payer: Medicaid Other | Admitting: Oncology

## 2023-04-29 ENCOUNTER — Telehealth: Payer: Self-pay

## 2023-04-29 ENCOUNTER — Inpatient Hospital Stay: Payer: Medicaid Other | Admitting: Oncology

## 2023-04-29 ENCOUNTER — Emergency Department: Payer: Medicaid Other

## 2023-04-29 ENCOUNTER — Other Ambulatory Visit: Payer: Medicaid Other

## 2023-04-29 DIAGNOSIS — Z8261 Family history of arthritis: Secondary | ICD-10-CM

## 2023-04-29 DIAGNOSIS — G620 Drug-induced polyneuropathy: Secondary | ICD-10-CM | POA: Diagnosis present

## 2023-04-29 DIAGNOSIS — J9811 Atelectasis: Secondary | ICD-10-CM | POA: Diagnosis present

## 2023-04-29 DIAGNOSIS — Z822 Family history of deafness and hearing loss: Secondary | ICD-10-CM

## 2023-04-29 DIAGNOSIS — Z66 Do not resuscitate: Secondary | ICD-10-CM | POA: Diagnosis present

## 2023-04-29 DIAGNOSIS — T451X5A Adverse effect of antineoplastic and immunosuppressive drugs, initial encounter: Secondary | ICD-10-CM | POA: Diagnosis present

## 2023-04-29 DIAGNOSIS — I428 Other cardiomyopathies: Secondary | ICD-10-CM | POA: Diagnosis present

## 2023-04-29 DIAGNOSIS — Z823 Family history of stroke: Secondary | ICD-10-CM

## 2023-04-29 DIAGNOSIS — R079 Chest pain, unspecified: Secondary | ICD-10-CM | POA: Diagnosis not present

## 2023-04-29 DIAGNOSIS — F411 Generalized anxiety disorder: Secondary | ICD-10-CM | POA: Diagnosis present

## 2023-04-29 DIAGNOSIS — E1122 Type 2 diabetes mellitus with diabetic chronic kidney disease: Secondary | ICD-10-CM | POA: Diagnosis present

## 2023-04-29 DIAGNOSIS — G4733 Obstructive sleep apnea (adult) (pediatric): Secondary | ICD-10-CM | POA: Diagnosis present

## 2023-04-29 DIAGNOSIS — Z83438 Family history of other disorder of lipoprotein metabolism and other lipidemia: Secondary | ICD-10-CM

## 2023-04-29 DIAGNOSIS — Z8501 Personal history of malignant neoplasm of esophagus: Secondary | ICD-10-CM

## 2023-04-29 DIAGNOSIS — R0789 Other chest pain: Secondary | ICD-10-CM | POA: Diagnosis not present

## 2023-04-29 DIAGNOSIS — C799 Secondary malignant neoplasm of unspecified site: Secondary | ICD-10-CM | POA: Diagnosis present

## 2023-04-29 DIAGNOSIS — E7800 Pure hypercholesterolemia, unspecified: Secondary | ICD-10-CM | POA: Diagnosis present

## 2023-04-29 DIAGNOSIS — N179 Acute kidney failure, unspecified: Secondary | ICD-10-CM | POA: Diagnosis not present

## 2023-04-29 DIAGNOSIS — I5043 Acute on chronic combined systolic (congestive) and diastolic (congestive) heart failure: Secondary | ICD-10-CM | POA: Diagnosis present

## 2023-04-29 DIAGNOSIS — Z7901 Long term (current) use of anticoagulants: Secondary | ICD-10-CM

## 2023-04-29 DIAGNOSIS — Z882 Allergy status to sulfonamides status: Secondary | ICD-10-CM

## 2023-04-29 DIAGNOSIS — I4821 Permanent atrial fibrillation: Secondary | ICD-10-CM | POA: Diagnosis present

## 2023-04-29 DIAGNOSIS — H5462 Unqualified visual loss, left eye, normal vision right eye: Secondary | ICD-10-CM | POA: Diagnosis present

## 2023-04-29 DIAGNOSIS — I700 Atherosclerosis of aorta: Secondary | ICD-10-CM | POA: Diagnosis present

## 2023-04-29 DIAGNOSIS — I314 Cardiac tamponade: Secondary | ICD-10-CM | POA: Diagnosis not present

## 2023-04-29 DIAGNOSIS — Z1152 Encounter for screening for COVID-19: Secondary | ICD-10-CM

## 2023-04-29 DIAGNOSIS — I3139 Other pericardial effusion (noninflammatory): Secondary | ICD-10-CM | POA: Diagnosis present

## 2023-04-29 DIAGNOSIS — R778 Other specified abnormalities of plasma proteins: Secondary | ICD-10-CM | POA: Diagnosis not present

## 2023-04-29 DIAGNOSIS — G9341 Metabolic encephalopathy: Secondary | ICD-10-CM | POA: Diagnosis not present

## 2023-04-29 DIAGNOSIS — N1411 Contrast-induced nephropathy: Secondary | ICD-10-CM | POA: Diagnosis not present

## 2023-04-29 DIAGNOSIS — Z79899 Other long term (current) drug therapy: Secondary | ICD-10-CM

## 2023-04-29 DIAGNOSIS — G893 Neoplasm related pain (acute) (chronic): Secondary | ICD-10-CM | POA: Diagnosis present

## 2023-04-29 DIAGNOSIS — Z818 Family history of other mental and behavioral disorders: Secondary | ICD-10-CM

## 2023-04-29 DIAGNOSIS — R627 Adult failure to thrive: Secondary | ICD-10-CM | POA: Diagnosis present

## 2023-04-29 DIAGNOSIS — Z515 Encounter for palliative care: Secondary | ICD-10-CM

## 2023-04-29 DIAGNOSIS — J918 Pleural effusion in other conditions classified elsewhere: Secondary | ICD-10-CM | POA: Diagnosis present

## 2023-04-29 DIAGNOSIS — I4891 Unspecified atrial fibrillation: Secondary | ICD-10-CM | POA: Diagnosis not present

## 2023-04-29 DIAGNOSIS — I69320 Aphasia following cerebral infarction: Secondary | ICD-10-CM

## 2023-04-29 DIAGNOSIS — C7919 Secondary malignant neoplasm of other urinary organs: Secondary | ICD-10-CM | POA: Diagnosis present

## 2023-04-29 DIAGNOSIS — Z833 Family history of diabetes mellitus: Secondary | ICD-10-CM

## 2023-04-29 DIAGNOSIS — C160 Malignant neoplasm of cardia: Secondary | ICD-10-CM | POA: Diagnosis present

## 2023-04-29 DIAGNOSIS — Z9109 Other allergy status, other than to drugs and biological substances: Secondary | ICD-10-CM

## 2023-04-29 DIAGNOSIS — C169 Malignant neoplasm of stomach, unspecified: Secondary | ICD-10-CM | POA: Diagnosis present

## 2023-04-29 DIAGNOSIS — Z808 Family history of malignant neoplasm of other organs or systems: Secondary | ICD-10-CM

## 2023-04-29 DIAGNOSIS — J9601 Acute respiratory failure with hypoxia: Secondary | ICD-10-CM | POA: Diagnosis not present

## 2023-04-29 DIAGNOSIS — K7469 Other cirrhosis of liver: Secondary | ICD-10-CM | POA: Diagnosis present

## 2023-04-29 DIAGNOSIS — N186 End stage renal disease: Secondary | ICD-10-CM | POA: Diagnosis present

## 2023-04-29 DIAGNOSIS — R918 Other nonspecific abnormal finding of lung field: Secondary | ICD-10-CM | POA: Diagnosis not present

## 2023-04-29 DIAGNOSIS — R0989 Other specified symptoms and signs involving the circulatory and respiratory systems: Secondary | ICD-10-CM | POA: Diagnosis not present

## 2023-04-29 DIAGNOSIS — Z7969 Long term (current) use of other immunomodulators and immunosuppressants: Secondary | ICD-10-CM

## 2023-04-29 DIAGNOSIS — F32A Depression, unspecified: Secondary | ICD-10-CM | POA: Diagnosis present

## 2023-04-29 DIAGNOSIS — Z6832 Body mass index (BMI) 32.0-32.9, adult: Secondary | ICD-10-CM

## 2023-04-29 DIAGNOSIS — E66811 Obesity, class 1: Secondary | ICD-10-CM | POA: Diagnosis present

## 2023-04-29 DIAGNOSIS — T508X5A Adverse effect of diagnostic agents, initial encounter: Secondary | ICD-10-CM | POA: Diagnosis not present

## 2023-04-29 DIAGNOSIS — Z8249 Family history of ischemic heart disease and other diseases of the circulatory system: Secondary | ICD-10-CM

## 2023-04-29 DIAGNOSIS — N1330 Unspecified hydronephrosis: Secondary | ICD-10-CM | POA: Diagnosis not present

## 2023-04-29 DIAGNOSIS — Z7984 Long term (current) use of oral hypoglycemic drugs: Secondary | ICD-10-CM

## 2023-04-29 DIAGNOSIS — I132 Hypertensive heart and chronic kidney disease with heart failure and with stage 5 chronic kidney disease, or end stage renal disease: Principal | ICD-10-CM | POA: Diagnosis present

## 2023-04-29 DIAGNOSIS — E875 Hyperkalemia: Secondary | ICD-10-CM | POA: Diagnosis not present

## 2023-04-29 DIAGNOSIS — Z7952 Long term (current) use of systemic steroids: Secondary | ICD-10-CM

## 2023-04-29 LAB — BASIC METABOLIC PANEL
Anion gap: 15 (ref 5–15)
BUN: 54 mg/dL — ABNORMAL HIGH (ref 6–20)
CO2: 20 mmol/L — ABNORMAL LOW (ref 22–32)
Calcium: 8.6 mg/dL — ABNORMAL LOW (ref 8.9–10.3)
Chloride: 102 mmol/L (ref 98–111)
Creatinine, Ser: 2.36 mg/dL — ABNORMAL HIGH (ref 0.61–1.24)
GFR, Estimated: 32 mL/min — ABNORMAL LOW (ref >60)
Glucose, Bld: 156 mg/dL — ABNORMAL HIGH (ref 70–99)
Potassium: 4.4 mmol/L (ref 3.5–5.1)
Sodium: 137 mmol/L (ref 135–145)

## 2023-04-29 LAB — CBC
HCT: 35.9 % — ABNORMAL LOW (ref 39.0–52.0)
Hemoglobin: 11.5 g/dL — ABNORMAL LOW (ref 13.0–17.0)
MCH: 28.9 pg (ref 26.0–34.0)
MCHC: 32 g/dL (ref 30.0–36.0)
MCV: 90.2 fL (ref 80.0–100.0)
Platelets: 182 10*3/uL (ref 150–400)
RBC: 3.98 MIL/uL — ABNORMAL LOW (ref 4.22–5.81)
RDW: 17.9 % — ABNORMAL HIGH (ref 11.5–15.5)
WBC: 5.9 10*3/uL (ref 4.0–10.5)
nRBC: 0 % (ref 0.0–0.2)

## 2023-04-29 LAB — TROPONIN I (HIGH SENSITIVITY)
Troponin I (High Sensitivity): 55 ng/L — ABNORMAL HIGH (ref <18)
Troponin I (High Sensitivity): 53 ng/L — ABNORMAL HIGH (ref <18)

## 2023-04-29 NOTE — ED Triage Notes (Signed)
Pt to ED for mid CP since 30 min ago. Dull.   From first nurse note: Patient arrived by [facility] from home. C/o left sided chest pain. Worsening with pressure. A&O x4 per EMS   Currently getting cancer treatments.    7/10 pain    EMS administered: 324 aspirin  1 nitroglycerin spray 4mg  zofran  Skin dry, pale. Stomach and esophageal cancer, gets chemo, last treatment 2 weeks ago, has port to R chest.

## 2023-04-29 NOTE — Transitions of Care (Post Inpatient/ED Visit) (Signed)
   04/29/2023  Name: Jeremiah Vance MRN: [id number] DOB: 1970-08-12  Today's TOC FU Call Status: Today's TOC FU Call Status:: Unsuccessful Call (1st Attempt) Unsuccessful Call (1st Attempt) Date: 04/29/23  Attempted to reach the patient regarding the most recent Inpatient/ED visit.  Follow Up Plan: Additional outreach attempts will be made to reach the patient to complete the Transitions of Care (Post Inpatient/ED visit) call.   Deidre Ala, BSN, RN [place]  VBCI - [employer] [facility] RN Care Manager [phone]

## 2023-04-29 NOTE — ED Triage Notes (Signed)
First nurse note: Patient arrived by [facility] from home. C/o left sided chest pain. Worsening with pressure. A&O x4 per EMS  Currently getting cancer treatments.   7/10 pain   EMS administered: 324 aspirin  1 nitroglycerin spray 4mg  zofran  EMS vitals: 75HR 98% RA 140/80 b/p

## 2023-04-30 ENCOUNTER — Inpatient Hospital Stay
Admission: EM | Admit: 2023-04-30 | Discharge: 2023-05-06 | DRG: 291 | Disposition: A | Discharge status: Hospice | Payer: Medicaid Other | Attending: Internal Medicine | Admitting: Internal Medicine

## 2023-04-30 ENCOUNTER — Inpatient Hospital Stay: Payer: Medicaid Other

## 2023-04-30 ENCOUNTER — Inpatient Hospital Stay (HOSPITAL_COMMUNITY)
Admit: 2023-04-30 | Discharge: 2023-04-30 | Disposition: A | Discharge status: Home / Self Care | Payer: Medicaid Other | Attending: Nurse Practitioner | Admitting: Nurse Practitioner

## 2023-04-30 ENCOUNTER — Encounter: Payer: Self-pay | Admitting: Obstetrics and Gynecology

## 2023-04-30 ENCOUNTER — Emergency Department: Payer: Medicaid Other

## 2023-04-30 DIAGNOSIS — K7469 Other cirrhosis of liver: Secondary | ICD-10-CM | POA: Diagnosis not present

## 2023-04-30 DIAGNOSIS — Z66 Do not resuscitate: Secondary | ICD-10-CM | POA: Diagnosis not present

## 2023-04-30 DIAGNOSIS — I10 Essential (primary) hypertension: Secondary | ICD-10-CM | POA: Diagnosis present

## 2023-04-30 DIAGNOSIS — N1832 Chronic kidney disease, stage 3b: Secondary | ICD-10-CM | POA: Diagnosis present

## 2023-04-30 DIAGNOSIS — E1122 Type 2 diabetes mellitus with diabetic chronic kidney disease: Secondary | ICD-10-CM | POA: Diagnosis not present

## 2023-04-30 DIAGNOSIS — Z7189 Other specified counseling: Secondary | ICD-10-CM | POA: Diagnosis not present

## 2023-04-30 DIAGNOSIS — G4733 Obstructive sleep apnea (adult) (pediatric): Secondary | ICD-10-CM | POA: Diagnosis present

## 2023-04-30 DIAGNOSIS — J90 Pleural effusion, not elsewhere classified: Secondary | ICD-10-CM | POA: Diagnosis not present

## 2023-04-30 DIAGNOSIS — N183 Chronic kidney disease, stage 3 unspecified: Secondary | ICD-10-CM | POA: Diagnosis not present

## 2023-04-30 DIAGNOSIS — I429 Cardiomyopathy, unspecified: Secondary | ICD-10-CM | POA: Diagnosis not present

## 2023-04-30 DIAGNOSIS — Z515 Encounter for palliative care: Secondary | ICD-10-CM | POA: Diagnosis not present

## 2023-04-30 DIAGNOSIS — E66811 Obesity, class 1: Secondary | ICD-10-CM | POA: Diagnosis present

## 2023-04-30 DIAGNOSIS — I700 Atherosclerosis of aorta: Secondary | ICD-10-CM | POA: Diagnosis present

## 2023-04-30 DIAGNOSIS — N1330 Unspecified hydronephrosis: Secondary | ICD-10-CM | POA: Diagnosis not present

## 2023-04-30 DIAGNOSIS — I3139 Other pericardial effusion (noninflammatory): Secondary | ICD-10-CM

## 2023-04-30 DIAGNOSIS — I5023 Acute on chronic systolic (congestive) heart failure: Secondary | ICD-10-CM | POA: Diagnosis present

## 2023-04-30 DIAGNOSIS — E119 Type 2 diabetes mellitus without complications: Secondary | ICD-10-CM

## 2023-04-30 DIAGNOSIS — Z8673 Personal history of transient ischemic attack (TIA), and cerebral infarction without residual deficits: Secondary | ICD-10-CM

## 2023-04-30 DIAGNOSIS — C169 Malignant neoplasm of stomach, unspecified: Secondary | ICD-10-CM | POA: Diagnosis present

## 2023-04-30 DIAGNOSIS — Z6832 Body mass index (BMI) 32.0-32.9, adult: Secondary | ICD-10-CM | POA: Diagnosis not present

## 2023-04-30 DIAGNOSIS — R079 Chest pain, unspecified: Principal | ICD-10-CM

## 2023-04-30 DIAGNOSIS — I480 Paroxysmal atrial fibrillation: Secondary | ICD-10-CM | POA: Diagnosis not present

## 2023-04-30 DIAGNOSIS — Z1152 Encounter for screening for COVID-19: Secondary | ICD-10-CM | POA: Diagnosis not present

## 2023-04-30 DIAGNOSIS — G9341 Metabolic encephalopathy: Secondary | ICD-10-CM | POA: Diagnosis not present

## 2023-04-30 DIAGNOSIS — T451X5A Adverse effect of antineoplastic and immunosuppressive drugs, initial encounter: Secondary | ICD-10-CM | POA: Diagnosis present

## 2023-04-30 DIAGNOSIS — I132 Hypertensive heart and chronic kidney disease with heart failure and with stage 5 chronic kidney disease, or end stage renal disease: Secondary | ICD-10-CM | POA: Diagnosis not present

## 2023-04-30 DIAGNOSIS — N189 Chronic kidney disease, unspecified: Secondary | ICD-10-CM | POA: Diagnosis not present

## 2023-04-30 DIAGNOSIS — F32A Depression, unspecified: Secondary | ICD-10-CM | POA: Diagnosis present

## 2023-04-30 DIAGNOSIS — R778 Other specified abnormalities of plasma proteins: Secondary | ICD-10-CM | POA: Diagnosis not present

## 2023-04-30 DIAGNOSIS — E875 Hyperkalemia: Secondary | ICD-10-CM

## 2023-04-30 DIAGNOSIS — J9601 Acute respiratory failure with hypoxia: Secondary | ICD-10-CM | POA: Diagnosis not present

## 2023-04-30 DIAGNOSIS — I69320 Aphasia following cerebral infarction: Secondary | ICD-10-CM | POA: Diagnosis not present

## 2023-04-30 DIAGNOSIS — C799 Secondary malignant neoplasm of unspecified site: Secondary | ICD-10-CM | POA: Diagnosis not present

## 2023-04-30 DIAGNOSIS — J918 Pleural effusion in other conditions classified elsewhere: Secondary | ICD-10-CM | POA: Diagnosis not present

## 2023-04-30 DIAGNOSIS — Z9889 Other specified postprocedural states: Secondary | ICD-10-CM | POA: Diagnosis not present

## 2023-04-30 DIAGNOSIS — C7919 Secondary malignant neoplasm of other urinary organs: Secondary | ICD-10-CM | POA: Diagnosis not present

## 2023-04-30 DIAGNOSIS — J9811 Atelectasis: Secondary | ICD-10-CM | POA: Diagnosis not present

## 2023-04-30 DIAGNOSIS — Z48813 Encounter for surgical aftercare following surgery on the respiratory system: Secondary | ICD-10-CM | POA: Diagnosis not present

## 2023-04-30 DIAGNOSIS — G620 Drug-induced polyneuropathy: Secondary | ICD-10-CM | POA: Diagnosis present

## 2023-04-30 DIAGNOSIS — R918 Other nonspecific abnormal finding of lung field: Secondary | ICD-10-CM | POA: Diagnosis not present

## 2023-04-30 DIAGNOSIS — I5043 Acute on chronic combined systolic (congestive) and diastolic (congestive) heart failure: Secondary | ICD-10-CM | POA: Diagnosis not present

## 2023-04-30 DIAGNOSIS — C168 Malignant neoplasm of overlapping sites of stomach: Secondary | ICD-10-CM | POA: Diagnosis not present

## 2023-04-30 DIAGNOSIS — R0989 Other specified symptoms and signs involving the circulatory and respiratory systems: Secondary | ICD-10-CM | POA: Diagnosis not present

## 2023-04-30 DIAGNOSIS — N179 Acute kidney failure, unspecified: Secondary | ICD-10-CM | POA: Diagnosis not present

## 2023-04-30 DIAGNOSIS — I314 Cardiac tamponade: Secondary | ICD-10-CM | POA: Diagnosis not present

## 2023-04-30 DIAGNOSIS — J9620 Acute and chronic respiratory failure, unspecified whether with hypoxia or hypercapnia: Secondary | ICD-10-CM

## 2023-04-30 DIAGNOSIS — I5020 Unspecified systolic (congestive) heart failure: Secondary | ICD-10-CM | POA: Diagnosis not present

## 2023-04-30 DIAGNOSIS — I4821 Permanent atrial fibrillation: Secondary | ICD-10-CM | POA: Diagnosis present

## 2023-04-30 DIAGNOSIS — J811 Chronic pulmonary edema: Secondary | ICD-10-CM | POA: Diagnosis not present

## 2023-04-30 DIAGNOSIS — R0789 Other chest pain: Secondary | ICD-10-CM | POA: Diagnosis not present

## 2023-04-30 DIAGNOSIS — N186 End stage renal disease: Secondary | ICD-10-CM | POA: Diagnosis not present

## 2023-04-30 DIAGNOSIS — C160 Malignant neoplasm of cardia: Secondary | ICD-10-CM | POA: Diagnosis not present

## 2023-04-30 DIAGNOSIS — R7989 Other specified abnormal findings of blood chemistry: Secondary | ICD-10-CM | POA: Diagnosis not present

## 2023-04-30 DIAGNOSIS — K8020 Calculus of gallbladder without cholecystitis without obstruction: Secondary | ICD-10-CM | POA: Diagnosis not present

## 2023-04-30 DIAGNOSIS — R590 Localized enlarged lymph nodes: Secondary | ICD-10-CM | POA: Diagnosis not present

## 2023-04-30 HISTORY — DX: Permanent atrial fibrillation: I48.21

## 2023-04-30 HISTORY — DX: Other cardiomyopathies: I42.8

## 2023-04-30 HISTORY — DX: Chronic systolic (congestive) heart failure: I50.22

## 2023-04-30 LAB — LACTATE DEHYDROGENASE, PLEURAL OR PERITONEAL FLUID: LD, Fluid: 1364 U/L — ABNORMAL HIGH (ref 3–23)

## 2023-04-30 LAB — RESP PANEL BY RT-PCR (RSV, FLU A&B, COVID)  RVPGX2
Influenza A by PCR: NEGATIVE
Influenza B by PCR: NEGATIVE
Resp Syncytial Virus by PCR: NEGATIVE
SARS Coronavirus 2 by RT PCR: NEGATIVE

## 2023-04-30 LAB — GLUCOSE, PLEURAL OR PERITONEAL FLUID: Glucose, Fluid: 62 mg/dL

## 2023-04-30 LAB — BODY FLUID CELL COUNT WITH DIFFERENTIAL
Eos, Fluid: 0 %
Lymphs, Fluid: 16 %
Monocyte-Macrophage-Serous Fluid: 82 %
Neutrophil Count, Fluid: 2 %
Total Nucleated Cell Count, Fluid: 1195 uL

## 2023-04-30 MED ORDER — SERTRALINE HCL 50 MG PO TABS
50.0000 mg | ORAL_TABLET | Freq: Every day | ORAL | Status: DC
  Administered 2023-05-01 – 2023-05-02 (×2): 50 mg via ORAL
  Filled 2023-04-30: qty 1

## 2023-04-30 MED ORDER — OXYCODONE HCL 5 MG PO TABS
5.0000 mg | ORAL_TABLET | Freq: Four times a day (QID) | ORAL | Status: DC | PRN
  Administered 2023-05-02 – 2023-05-05 (×5): 5 mg via ORAL
  Filled 2023-04-30 (×5): qty 1

## 2023-04-30 MED ORDER — LIDOCAINE HCL (PF) 1 % IJ SOLN
10.0000 mL | Freq: Once | INTRAMUSCULAR | Status: AC
  Administered 2023-04-30: 10 mL via INTRADERMAL

## 2023-04-30 MED ORDER — ATORVASTATIN CALCIUM 20 MG PO TABS
40.0000 mg | ORAL_TABLET | Freq: Every day | ORAL | Status: DC
  Administered 2023-04-30 – 2023-05-04 (×5): 40 mg via ORAL
  Filled 2023-04-30 (×6): qty 2

## 2023-04-30 MED ORDER — METOPROLOL TARTRATE 25 MG PO TABS
12.5000 mg | ORAL_TABLET | Freq: Two times a day (BID) | ORAL | Status: DC
  Administered 2023-04-30 – 2023-05-05 (×10): 12.5 mg via ORAL
  Filled 2023-04-30 (×11): qty 1

## 2023-04-30 MED ORDER — APIXABAN 5 MG PO TABS: 5.0000 mg | ORAL_TABLET | Freq: Two times a day (BID) | ORAL | Status: DC

## 2023-04-30 MED ORDER — IOHEXOL 350 MG/ML SOLN
50.0000 mL | Freq: Once | INTRAVENOUS | Status: AC | PRN
  Administered 2023-04-30: 50 mL via INTRAVENOUS

## 2023-04-30 MED ORDER — BUSPIRONE HCL 15 MG PO TABS
7.5000 mg | ORAL_TABLET | Freq: Two times a day (BID) | ORAL | Status: DC
  Administered 2023-04-30 – 2023-05-05 (×10): 7.5 mg via ORAL
  Filled 2023-04-30 (×4): qty 1
  Filled 2023-04-30: qty 2
  Filled 2023-04-30 (×2): qty 1
  Filled 2023-04-30 (×2): qty 2
  Filled 2023-04-30 (×2): qty 1
  Filled 2023-04-30: qty 2

## 2023-04-30 MED ORDER — FUROSEMIDE 10 MG/ML IJ SOLN
80.0000 mg | Freq: Once | INTRAMUSCULAR | Status: AC
  Administered 2023-04-30: 80 mg via INTRAVENOUS
  Filled 2023-04-30: qty 8

## 2023-04-30 MED ORDER — APIXABAN 5 MG PO TABS
5.0000 mg | ORAL_TABLET | Freq: Two times a day (BID) | ORAL | Status: DC
  Administered 2023-04-30 – 2023-05-05 (×11): 5 mg via ORAL
  Filled 2023-04-30 (×11): qty 1

## 2023-04-30 MED ORDER — SERTRALINE HCL 50 MG PO TABS
25.0000 mg | ORAL_TABLET | Freq: Every day | ORAL | Status: DC
  Administered 2023-05-01 – 2023-05-02 (×2): 25 mg via ORAL
  Filled 2023-04-30 (×2): qty 1

## 2023-04-30 NOTE — Consult Note (Signed)
Cardiology Consult    Patient ID: KRZYSZTOF REICHELT MRN: [id number], DOB/AGE: 1970/04/15   Admit date: 04/30/2023 Date of Consult: 04/30/2023  Primary Physician: Debera Lat, PA-C Primary Cardiologist: Debbe Odea, MD Requesting Provider: Alfonso Patten, MD  Patient Profile    Jeremiah Vance is a 53 y.o. 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 Ashok Pall.  Past Medical History  Subjective  Past Medical History:  Diagnosis Date   Acute ischemic stroke (HCC) 09/06/2017   Acute renal failure superimposed on stage 3a chronic kidney disease (HCC) 07/08/2019   Acute right-sided weakness    Allergies    Anasarca    Anemia 07/10/2017   Arthritis    Cancer (HCC)    stomach and esophageal cancer stage 4   Chicken pox    CKD (chronic kidney disease) stage 3, GFR 30-59 ml/min (HCC) 06/2017   Depression    Elevated troponin 07/08/2019   Heart failure with mid-range ejection fraction (HCC)    a. 06/2017 Echo: EF 40-45%; b. 07/2019 Echo: EF 50-55%; c. 10/2021 Echo: EF 30-35%; d. 10/2022 Echo: EF 55-60%; e. 04/2023 Echo: EF 40-45%, glob HK, mod LVH, mildly reduced RV fxn, mod dil LA, mild MR, Ao root 41mm, asc Ao 42mm.   High cholesterol    History of cardiomyopathy    LVEF 40 to 45% in April 2019 - subsequently normalized   Hyperbilirubinemia 07/10/2017   Hypertension    Ischemic stroke (HCC)    Small left internal capsule infarct due to lacunar disease   Morbid obesity (HCC)    NICM (nonischemic cardiomyopathy) (HCC)    a. 06/2017 Echo: EF 40-45%; b. 07/2019 Echo: EF 50-55%; c. 10/2021 Echo: EF 30-35%; d. 12/2021 MV: EF 60%, no ischemia/scar; e. 10/2022 Echo: EF 55-60%; f. 04/2023 Echo: EF  40-45%.   Normocytic anemia 12/16/2017   Permanent atrial fibrillation (HCC)    a. CHA2DS2VASc = 5-->eliquis.   Recurrent incisional hernia with incarceration s/p repair 10/22/2017 10/21/2017   Stroke (HCC) 08/2017   "right sided weakness since; getting stronger though" (10/22/2017)   Type 2 diabetes mellitus [facility])     Past Surgical History:  Procedure Laterality Date   ABDOMINAL HERNIA REPAIR  2008; 10/22/2017   "scope; OPEN REPAIR INCARCERATED VENTRAL HERNIA   ESOPHAGOGASTRODUODENOSCOPY (EGD) WITH PROPOFOL N/A 09/10/2022   Procedure: ESOPHAGOGASTRODUODENOSCOPY (EGD) WITH PROPOFOL;  Surgeon: Toney Reil, MD;  Location: [facility] ENDOSCOPY;  Service: Gastroenterology;  Laterality: N/A;   ESOPHAGOGASTRODUODENOSCOPY (EGD) WITH PROPOFOL N/A 09/28/2022   Procedure: ESOPHAGOGASTRODUODENOSCOPY (EGD) WITH PROPOFOL;  Surgeon: Jaynie Collins, DO;  Location: [facility] ENDOSCOPY;  Service: Gastroenterology;  Laterality: N/A;   HEMOSTASIS CONTROL  09/28/2022   Procedure: HEMOSTASIS CONTROL;  Surgeon: Jaynie Collins, DO;  Location: [facility] ENDOSCOPY;  Service: Gastroenterology;;   HERNIA REPAIR     KNEE ARTHROSCOPY Right 1989   PORTA CATH INSERTION N/A 09/25/2022   Procedure: PORTA CATH INSERTION;  Surgeon: Annice Needy, MD;  Location: [facility] INVASIVE CV LAB;  Service: Cardiovascular;  Laterality: N/A;   VENTRAL HERNIA REPAIR N/A 10/22/2017   Procedure: OPEN REPAIR INCARCERATED VENTRAL HERNIA;  Surgeon: Glenna Fellows, MD;  Location: [facility] OR;  Service: General;  Laterality: N/A;     Allergies  Allergies  Allergen Reactions   Pollen Extract Other (See Comments)   Sulfa Antibiotics       History of Present Illness   53 y.o. 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  He has a h/o LV dysfunction dating back  to at least 2019, at which time, EF was was 40-45%.  He subsequently was noted to have some improvement in 2021, in 10/2021, in the setting of marked HTN and intracranial hemorrhage, repeat echo showed reduced EF to 30-35%.  He underwent stress testing in 12/2021, which showed no ischemia or infarct w/ nl LV fxn.  Unfortunately, he suffered a stroke in 12/2021.  Subsequent event monitoring resulted in 04/2022, and showed PAF.  Notes indicate that aspirin was d/c'd and eliquis 5 bid was to be started in 04/2022, though pt did not follow-up.  He was admitted 05/2022 w/ recurrent stroke, anemia, and afib.  He was also dx w/ GE jxn carcinoma, and was started on eliquis 2.5 bid in the setting of concerns related to bleeding/cancer.  Echo showed EF 55-60%.  He had multiple admissions in 2024 for anemia earlier in the year and stroke in 10/2022 followed by rehab stay.  He was just admitted to [facility] in mid-January due to dyspnea in the setting of noncompliance w/ medications, HFmrEF, permanent afib, and acute on chronic stage 3 kidney dzs.  He was diuresed w/ symptomatic improvement but also assoc w/ bump in creat necessitating holding of diuretic therapy, and orthostasis necessitating discontinuation of hydralazine therapy.  Echo during admission showed EF of 40-45%, glob HK, mildly reduced RV fxn, mod dil LA, small pericardial effusion, mild MR, and mildly dil Ao root and asc Ao.  Creatinine rose from 1.83 on January 23 to 2.11 on January 26.  He was discharged home on losartan and metoprolol (new) and advised to continue torsemide 10 mg daily as needed.   On arrival to the ED, he was afebrile and relatively hypotensive at 95/74.  He was saturating at 93% on room air.  ECG showed atrial fibrillation at 74 bpm with left axis deviation, baseline artifact, and lateral ST depression with T wave inversion, which was not acutely changed.  Labs notable for further worsening of renal function with a BUN of 54 and creatinine of  2.36, stable normocytic anemia, and mild troponin elevation with flat downtrend at 55  53.  CXR w/ persistent left basilar consolidation/atelectasis with probable adjacent effusion and increased perihilar pulmonary vascular congestion.  CTA of the chest was negative for PE but showed moderate to large pericardial effusion with large bilateral pleural effusions (left greater than right), mild interstitial edema, and stable small pulmonary nodules.  He was treated with 80 mg of intravenous Lasix and subsequently underwent left-sided thoracentesis with removal of 1.5 L of pleural fluid. Inpatient Medications  Subjective    apixaban  5 mg Oral BID   atorvastatin  40 mg Oral q1800   busPIRone  7.5 mg Oral BID   metoprolol tartrate  12.5 mg Oral BID   sertraline  25 mg Oral Daily   sertraline  50 mg Oral Daily    Family History    Family History  Problem Relation Age of Onset   Diabetes Mother    Stroke Mother    Arthritis Mother    Depression Mother  Heart disease Mother    Hypertension Mother    Learning disabilities Mother    Mental illness Mother    Sleep apnea Mother    Diabetes Father    Heart disease Father    Arthritis Father    Hearing loss Father    Hyperlipidemia Father    Heart attack Father    Hypertension Father    Stroke Father    Diabetes Sister    Depression Sister    Diabetes Sister    Hypertension Sister    Mental illness Sister    Sleep apnea Sister    Diabetes Maternal Grandmother    Heart disease Maternal Grandmother    Depression Maternal Grandmother    Hyperlipidemia Maternal Grandmother    Hypertension Maternal Grandmother    Stroke Maternal Grandmother    Hypertension Maternal Grandfather    Hyperlipidemia Maternal Grandfather    Stroke Maternal Grandfather    Arthritis Paternal Grandmother    Hearing loss Paternal Grandmother    Stroke Paternal Grandfather    Heart disease Paternal Grandfather    Arthritis Paternal Grandfather    Heart attack  Paternal Grandfather    Melanoma Other    He indicated that his mother is deceased. He indicated that his father is alive. He indicated that both of his sisters are alive. He indicated that his maternal grandmother is deceased. He indicated that his maternal grandfather is deceased. He indicated that his paternal grandmother is deceased. He indicated that his paternal grandfather is deceased. He indicated that the status of his other is unknown.   Social History    Social History   Socioeconomic History   Marital status: Divorced    Spouse name: Not on file   Number of children: 0   Years of education: Not on file   Highest education level: Not on file  Occupational History   Occupation: Disabled  Tobacco Use   Smoking status: Never   Smokeless tobacco: Never  Vaping Use   Vaping status: Never Used  Substance and Sexual Activity   Alcohol use: Yes    Comment: occasional beer   Drug use: Never   Sexual activity: Not Currently  Other Topics Concern   Not on file  Social History Narrative   He lives with his sister , [name] and she is his MPOA after his stokes   Social Drivers of Corporate investment banker Strain: Low Risk  (04/24/2023)   Overall Financial Resource Strain (CARDIA)    Difficulty of Paying Living Expenses: Not hard at all  Food Insecurity: No Food Insecurity (04/30/2023)   Hunger Vital Sign    Worried About Running Out of Food in the Last Year: Never true    Ran Out of Food in the Last Year: Never true  Transportation Needs: No Transportation Needs (04/30/2023)   PRAPARE - Administrator, Civil Service (Medical): No    Lack of Transportation (Non-Medical): No  Physical Activity: Not on file  Stress: Not on file  Social Connections: Moderately Isolated (04/30/2023)   Social Connection and Isolation Panel [NHANES]    Frequency of Communication with Friends and Family: Three times a week    Frequency of Social Gatherings with Friends and Family:  Once a week    Attends Religious Services: 1 to 4 times per year    Active Member of [employer] or Organizations: No    Attends Banker Meetings: Never    Marital Status: Divorced  Catering manager Violence:  Not At Risk (04/30/2023)   Humiliation, Afraid, Rape, and Kick questionnaire    Fear of Current or Ex-Partner: No    Emotionally Abused: No    Physically Abused: No    Sexually Abused: No     Review of Systems    General:  No chills, fever, night sweats or weight changes.  Cardiovascular:  No chest pain, dyspnea on exertion, edema, orthopnea, palpitations, paroxysmal nocturnal dyspnea. Dermatological: No rash, lesions/masses Respiratory: No cough, dyspnea Urologic: No hematuria, dysuria Abdominal:   No nausea, vomiting, diarrhea, bright red blood per rectum, melena, or hematemesis Neurologic:  No visual changes, wkns, changes in mental status. All other systems reviewed and are otherwise negative except as noted above.    Objective  Physical Exam    Blood pressure 115/71, pulse 92, temperature 97.9 F (36.6 C), temperature source Oral, resp. rate (!) 27, height 5\' 11"  (1.803 m), weight 106 kg, SpO2 90%.  General: Pleasant, NAD Psych: Normal affect. Neuro: Alert and oriented X 3. Moves all extremities spontaneously. HEENT: Normal  Neck: Supple without bruits or JVD. Lungs:  Resp regular and unlabored, CTA. Heart: RRR no s3, s4, or murmurs. Abdomen: Soft, non-tender, non-distended, BS + x 4.  Extremities: No clubbing, cyanosis or edema. DP/PT2+, Radials 2+ and equal bilaterally.  Labs    Cardiac Enzymes Recent Labs  Lab 04/29/23 1655 04/29/23 2254  TROPONINIHS 55* 53*     BNP    Component Value Date/Time   BNP 136.0 (H) 04/23/2023 1713    ProBNP    Component Value Date/Time   PROBNP 966 (H) 11/28/2021 1520    Lab Results  Component Value Date   WBC 5.9 04/29/2023   HGB 11.5 (L) 04/29/2023   HCT 35.9 (L) 04/29/2023   MCV 90.2 04/29/2023   PLT  182 04/29/2023    Recent Labs  Lab 04/29/23 1655  NA 137  K 4.4  CL 102  CO2 20*  BUN 54*  CREATININE 2.36*  CALCIUM 8.6*  GLUCOSE 156*   Lab Results  Component Value Date   CHOL 120 11/22/2022   HDL 45 11/22/2022   LDLCALC 63 11/22/2022   TRIG 58 11/22/2022   Lab Results  Component Value Date   DDIMER 1.27 (H) 11/29/2021      Radiology Studies    DG Chest Port 1 View Result Date: 04/30/2023 CLINICAL DATA:  Status post thoracentesis EXAM: PORTABLE CHEST 1 VIEW COMPARISON:  CT chest dated April 30, 2023. Chest radiograph dated April 29, 2023. FINDINGS: Status post thoracentesis with moderate-to-large left pleural effusion, decreased in size compared to the prior exam. There is improved aeration of the left upper lung. Persistent left basilar atelectasis. Small to moderate-sized layering right pleural effusion with associated basilar atelectasis. No pneumothorax. Central pulmonary vascular congestion. Similar enlargement of the cardiac silhouette. Stable right chest Port-A-Cath in place. Visualized osseous structures are unchanged. IMPRESSION: 1. Status post thoracentesis with decreased size of a moderate-to-large left pleural effusion and improved aeration within the left upper lung. Left basilar atelectasis. 2. Small to moderate-sized layering right pleural effusion with associated basilar atelectasis. 3. Central pulmonary vascular congestion. Electronically Signed   By: Hart Robinsons M.D.   On: 04/30/2023 12:10   US THORACENTESIS ASP PLEURAL SPACE W/IMG GUIDE Result Date: 04/30/2023 INDICATION: Patient with history of CHF, CKD3, cirrhosis, stage 4 gastric adenocarcinoma admitted for chest pain and dyspnea. Imaging shows new bilateral pleural effusions. Request for diagnostic and therapeutic thoracentesis. EXAM: ULTRASOUND GUIDED LEFT THORACENTESIS MEDICATIONS: 7 mL  1% lidocaine COMPLICATIONS: None immediate. PROCEDURE: An ultrasound guided thoracentesis was thoroughly  discussed with the patient and questions answered. The benefits, risks, alternatives and complications were also discussed. The patient understands and wishes to proceed with the procedure. Written consent was obtained. Ultrasound was performed to localize and mark an adequate pocket of fluid in the left chest. The area was then prepped and draped in the normal sterile fashion. 1% Lidocaine was used for local anesthesia. Under ultrasound guidance a 6 Fr Safe-T-Centesis catheter was introduced. Thoracentesis was performed. The catheter was removed and a dressing applied. FINDINGS: A total of approximately 1.5 L of clear yellow fluid was removed. Samples were sent to the laboratory as requested by the clinical team. IMPRESSION: Successful ultrasound guided left thoracentesis yielding 1.5 L of pleural fluid. Performed by Lynnette Caffey, PA-C Electronically Signed   By: Malachy Moan M.D.   On: 04/30/2023 12:04   CT Angio Chest PE W and/or Wo Contrast Result Date: 04/30/2023 CLINICAL DATA:  53 year old male with history of shortness of breath and hypoxia. Left-sided chest pain. History of gastric cancer. * Tracking Code: BO * EXAM: CT ANGIOGRAPHY CHEST WITH CONTRAST TECHNIQUE: Multidetector CT imaging of the chest was performed using the standard protocol during bolus administration of intravenous contrast. Multiplanar CT image reconstructions and MIPs were obtained to evaluate the vascular anatomy. RADIATION DOSE REDUCTION: This exam was performed according to the departmental dose-optimization program which includes automated exposure control, adjustment of the mA and/or kV according to patient size and/or use of iterative reconstruction technique. CONTRAST:  50mL OMNIPAQUE IOHEXOL 350 MG/ML SOLN COMPARISON:  CT of the chest, abdomen and pelvis 04/10/2023. FINDINGS: Cardiovascular: Study is slightly limited by suboptimal contrast bolus. With these limitations in mind, there is no central, lobar or segmental  sized filling defect in the pulmonary arterial tree to indicate clinically relevant pulmonary embolism. Smaller subsegmental sized pulmonary emboli can not be entirely excluded. Heart size is normal. Moderate to large pericardial effusion. No definite pericardial calcification. There is aortic atherosclerosis, as well as atherosclerosis of the great vessels of the mediastinum and the coronary arteries, including calcified atherosclerotic plaque in the left anterior descending coronary artery. Mild calcifications of the aortic valve and mitral annulus. Right internal jugular single-lumen porta cath with tip terminating at the superior cavoatrial junction. Mediastinum/Nodes: No pathologically enlarged mediastinal or hilar lymph nodes. Esophagus is unremarkable in appearance. No axillary lymphadenopathy. Lungs/Pleura: Large bilateral pleural effusions (left-greater-than-right), predominantly lying dependently. Associated with these is extensive passive atelectasis in the lungs bilaterally. No definite consolidative airspace disease. Mild diffuse ground-glass attenuation and mild interlobular septal thickening in the lungs suggesting a background of mild interstitial pulmonary edema. Several small scattered pulmonary nodules are noted measuring 5 mm or less in size, similar to several prior examinations dating back to 2023, favored to be benign. No larger more suspicious appearing pulmonary nodules or masses are noted. Upper Abdomen: Aortic atherosclerosis. Musculoskeletal: There are no aggressive appearing lytic or blastic lesions noted in the visualized portions of the skeleton. Review of the MIP images confirms the above findings. IMPRESSION: 1. No central, lobar or segmental sized pulmonary embolism. 2. Moderate to large pericardial effusion. 3. Large bilateral pleural effusions (left-greater-than-right) with extensive passive atelectasis in the lungs bilaterally. 4. Mild interstitial pulmonary edema. 5. Aortic  atherosclerosis, in addition to left anterior descending coronary artery disease. 6. There are calcifications of the aortic valve and mitral annulus. Echocardiographic correlation for evaluation of potential valvular dysfunction may be warranted if clinically indicated.  7. Small pulmonary nodules measuring 5 mm or less in size, stable compared to prior examinations, favored to be benign (although continued attention on future follow-up imaging is recommended given the patient's history of gastric cancer). Aortic Atherosclerosis (ICD10-I70.0). Electronically Signed   By: Trudie Reed M.D.   On: 04/30/2023 07:24   DG Chest 2 View Result Date: 04/29/2023 CLINICAL DATA:  Chest pain EXAM: CHEST - 2 VIEW COMPARISON:  04/23/2023 FINDINGS: Persistent consolidation/atelectasis at the left lung base with probable adjacent effusion. Some increase in perihilar pulmonary vascular congestion suspected. Mild thickening of or fluid in the interlobar fissures as before. No pneumothorax. Stable right IJ port catheter. Heart size and mediastinal contours are within normal limits. Visualized bones unremarkable. IMPRESSION: 1. Persistent left basilar consolidation/atelectasis with probable adjacent effusion. 2. Increased perihilar pulmonary vascular congestion. Electronically Signed   By: Corlis Leak M.D.   On: 04/29/2023 17:24     ECG & Cardiac Imaging    atrial fibrillation at 74 bpm with left axis deviation, baseline artifact, and lateral ST depression with T wave inversion - personally reviewed.  Assessment & Plan    Mr. 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 presenting with worsening shortness of breath  Acute on chronic respiratory failure Pleural effusion noted, status post thoracentesis 1.5 L out Etiology of pleural effusion likely multifactorial including metastatic cancer,  atrial fibrillation, diastolic systolic CHF, renal failure, cirrhosis -Symptomatically improved following thoracentesis -Scheduled to receive dose of IV Lasix -Discussed complicating factors as detailed above with family at the bedside  Pericardial effusion Asymptomatic Echocardiogram 1 week ago pericardial effusion with small CT scan concerning for moderate to severe pericardial effusion -Repeat limited echo pending  Elevated troponin In the setting of respiratory distress, Supply/demand mismatch On Eliquis in place of aspirin  Stage IV gastric adenocarcinoma Prognosis poor Followed by oncology  Cardiomyopathy Ejection fraction 40 to 45%, moderate LVH Continue Farxiga, losartan IV Lasix daily with close monitoring of renal function Continue low-dose metoprolol  Cirrhosis Trace ascites seen on CT scan  Diabetes type 2 On Farxiga   Signed, Dossie Arbour, MD, Ph.D [facility]  For questions or updates, please contact   Please consult [email or web address] for contact info under Cardiology/STEMI.

## 2023-04-30 NOTE — Consult Note (Signed)
Hematology/Oncology Consult note [phone] [phone]      Patient Care Team: Debera Lat, PA-C as PCP - General (Physician Assistant) Debbe Odea, MD as PCP - Cardiology (Cardiology) Toney Reil, MD as Consulting Physician (Gastroenterology) Toney Reil, MD as Consulting Physician (Gastroenterology) Rickard Patience, MD as Consulting Physician (Oncology) Benita Gutter, RN as Oncology Nurse Navigator   Name of the patient: Jeremiah Vance  [id number]  1970/12/21   REASON FOR COSULTATION:  Gastric cancer History of presenting illness-  53 y.o. male with PMH listed at below who presents to ER for evaluation of chest pain, shortness of breath.  Patient was recently discharged a few days ago after his hospitalization due to CHF exacerbation and he was diuresed.  Patient was known to me for history of stage IV gastric cancer, patient has completed 12 cycles of FOLFOX and currently on 5-FU maintenance treatments.  His January 2025 CT scan showed stable disease overall  He last received chemotherapy on 04/15/2023.  04/30/2023, patient had a CT chest angiogram which showed no PE, there is moderate to large pericardial effusion, large bilateral pleural effusion with extensive passive atelectasis in the lungs bilaterally.  Pulmonary edema, aortic atherosclerosis, calcifications of aortic valve in the mitral annulus.  Small pulmonary nodules measuring 5 mm or less in size.  Stable.   Patient's status post diagnostic and therapeutic paracentesis.  Oncology was consulted for discussion of goals of care.  Patient reports that his breathing status has largely improved after thoracentesis.  Appetite is fair.  No nausea vomiting.  Sister at the bedside.   Allergies  Allergen Reactions   Pollen Extract Other (See Comments)   Sulfa Antibiotics     Patient Active Problem List   Diagnosis Date Noted   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    Priority: High    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    Priority: Medium    Encounter for antineoplastic chemotherapy 10/02/2022    Priority: Medium    Paroxysmal atrial fibrillation (HCC) 09/08/2022    Priority: Medium    Other cirrhosis of liver (HCC)     Priority: Medium    Clavicular fracture 01/07/2023    Priority: Low   Ischemic cerebrovascular accident (CVA) of frontal lobe (HCC) 11/27/2022    Priority: Low   Goals of care, counseling/discussion 09/19/2022    Priority: Low   Anxiety associated with cancer diagnosis (HCC) 09/19/2022    Priority: Low   IDA (iron deficiency anemia) 09/09/2022    Priority: Low   Cough     Priority: Low   CKD (chronic kidney disease) stage 3, GFR 30-59 ml/min (HCC) 08/31/2019    Priority: Low   Pleural effusion 04/30/2023   Permanent atrial fibrillation (HCC) 04/24/2023   SOB (shortness of breath) 04/23/2023   PAF (paroxysmal atrial fibrillation) (HCC) 11/27/2022   Diabetes mellitus (HCC) 11/27/2022   Class 1 obesity with serious comorbidity in adult 11/27/2022   Benign essential HTN 11/27/2022   Acute CVA (cerebrovascular accident) (HCC) 11/22/2022   Stroke (HCC) 11/21/2022   Depression with anxiety 11/21/2022   Obesity (BMI 30-39.9) 11/21/2022   Hypomagnesemia 11/21/2022   Mood disorder (HCC) 10/29/2022   Type 2 diabetes mellitus with hyperglycemia, without long-term current use of insulin (HCC) 10/29/2022   Acute on chronic combined systolic and diastolic CHF (congestive heart failure) (HCC) 09/27/2022   Chronic kidney disease, stage 3b (HCC) 09/27/2022   Type II diabetes mellitus with renal manifestations (HCC) 09/27/2022   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   Dysarthria 05/20/2022   Dyslipidemia 01/16/2022   Acute on chronic diastolic CHF (congestive heart failure) (HCC) 01/16/2022   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    HFrEF (heart failure with reduced ejection fraction) (HCC)    History  of CVA (cerebrovascular accident) 10/17/2019   DM (diabetes mellitus), type 2 with complications (HCC) 07/08/2019   Moderate obstructive sleep apnea 02/11/2018   Chronic fatigue 12/16/2017   Essential hypertension 12/16/2017   History of small bowel obstruction 10/24/2017   AKI (acute kidney injury) (HCC)    Hyperlipidemia    Gout      Past Medical History:  Diagnosis Date   Acute ischemic stroke (HCC) 09/06/2017   Acute renal failure superimposed on stage 3a chronic kidney disease (HCC) 07/08/2019   Acute right-sided weakness    Allergies    Anasarca    Anemia 07/10/2017   Arthritis    Cancer (HCC)    stomach and esophageal cancer stage 4   CHF (congestive heart failure) (HCC)    "coinsided w/kidney problems I was having 06/2017"   Chicken pox    CKD (chronic kidney disease) stage 3, GFR 30-59 ml/min (HCC) 06/2017   Depression    Elevated troponin 07/08/2019   High cholesterol    History of cardiomyopathy    LVEF 40 to 45% in April 2019 - subsequently normalized   Hyperbilirubinemia 07/10/2017   Hypertension    Ischemic stroke (HCC)    Small left internal capsule infarct due to lacunar disease   Morbid obesity (HCC)    Normocytic anemia 12/16/2017   Recurrent incisional hernia with incarceration s/p repair 10/22/2017 10/21/2017   Stroke (HCC) 08/2017   "right sided weakness since; getting stronger though" (10/22/2017)   Type 2 diabetes mellitus (HCC)      Past Surgical History:  Procedure Laterality Date   ABDOMINAL HERNIA REPAIR  2008; 10/22/2017   "scope; OPEN REPAIR INCARCERATED VENTRAL HERNIA   ESOPHAGOGASTRODUODENOSCOPY (EGD) WITH PROPOFOL N/A 09/10/2022   Procedure: ESOPHAGOGASTRODUODENOSCOPY (EGD) WITH PROPOFOL;  Surgeon: Toney Reil, MD;  Location: [facility] ENDOSCOPY;  Service: Gastroenterology;  Laterality: N/A;   ESOPHAGOGASTRODUODENOSCOPY (EGD) WITH PROPOFOL N/A 09/28/2022   Procedure: ESOPHAGOGASTRODUODENOSCOPY (EGD) WITH PROPOFOL;  Surgeon: Jaynie Collins, DO;  Location: [facility] ENDOSCOPY;  Service: Gastroenterology;  Laterality: N/A;   HEMOSTASIS CONTROL  09/28/2022   Procedure: HEMOSTASIS CONTROL;  Surgeon: Jaynie Collins, DO;  Location: [facility] ENDOSCOPY;  Service: Gastroenterology;;   HERNIA REPAIR     KNEE ARTHROSCOPY Right 1989   PORTA CATH INSERTION N/A 09/25/2022   Procedure: PORTA CATH INSERTION;  Surgeon: Annice Needy, MD;  Location: [facility] INVASIVE CV LAB;  Service: Cardiovascular;  Laterality: N/A;   VENTRAL HERNIA REPAIR N/A 10/22/2017   Procedure: OPEN REPAIR INCARCERATED VENTRAL HERNIA;  Surgeon: Glenna Fellows, MD;  Location: [facility] OR;  Service: General;  Laterality: N/A;    Social History   Socioeconomic History   Marital status: Divorced    Spouse name: Not on file   Number of children: 0   Years of education: Not on file   Highest education level: Not on file  Occupational History   Occupation: Disabled  Tobacco Use   Smoking status: Never   Smokeless tobacco: Never  Vaping Use   Vaping status: Never Used  Substance and Sexual Activity   Alcohol use: Yes    Comment: occasional beer   Drug use: Never   Sexual activity: Not Currently  Other Topics Concern  Not on file  Social History Narrative   He lives with his sister , [name] and she is his MPOA after his stokes   Social Drivers of Corporate investment banker Strain: Low Risk  (04/24/2023)   Overall Financial Resource Strain (CARDIA)    Difficulty of Paying Living Expenses: Not hard at all  Food Insecurity: No Food Insecurity (04/30/2023)   Hunger Vital Sign    Worried About Running Out of Food in the Last Year: Never true    Ran Out of Food in the Last Year: Never true  Transportation Needs: No Transportation Needs (04/30/2023)   PRAPARE - Administrator, Civil Service (Medical): No    Lack of Transportation (Non-Medical): No  Physical Activity: Not on file  Stress: Not on file  Social Connections: Moderately Isolated  (04/30/2023)   Social Connection and Isolation Panel [NHANES]    Frequency of Communication with Friends and Family: Three times a week    Frequency of Social Gatherings with Friends and Family: Once a week    Attends Religious Services: 1 to 4 times per year    Active Member of [employer] or Organizations: No    Attends Banker Meetings: Never    Marital Status: Divorced  Catering manager Violence: Not At Risk (04/30/2023)   Humiliation, Afraid, Rape, and Kick questionnaire    Fear of Current or Ex-Partner: No    Emotionally Abused: No    Physically Abused: No    Sexually Abused: No     Family History  Problem Relation Age of Onset   Diabetes Mother    Stroke Mother    Arthritis Mother    Depression Mother    Heart disease Mother    Hypertension Mother    Learning disabilities Mother    Mental illness Mother    Sleep apnea Mother    Diabetes Father    Heart disease Father    Arthritis Father    Hearing loss Father    Hyperlipidemia Father    Heart attack Father    Hypertension Father    Stroke Father    Diabetes Sister    Depression Sister    Diabetes Sister    Hypertension Sister    Mental illness Sister    Sleep apnea Sister    Diabetes Maternal Grandmother    Heart disease Maternal Grandmother    Depression Maternal Grandmother    Hyperlipidemia Maternal Grandmother    Hypertension Maternal Grandmother    Stroke Maternal Grandmother    Hypertension Maternal Grandfather    Hyperlipidemia Maternal Grandfather    Stroke Maternal Grandfather    Arthritis Paternal Grandmother    Hearing loss Paternal Grandmother    Stroke Paternal Grandfather    Heart disease Paternal Grandfather    Arthritis Paternal Grandfather    Heart attack Paternal Grandfather    Melanoma Other      Current Facility-Administered Medications:    apixaban (ELIQUIS) tablet 5 mg, 5 mg, Oral, BID, Nazari, Walid A, RPH   atorvastatin (LIPITOR) tablet 40 mg, 40 mg, Oral, q1800, Wouk,  Wilfred Curtis, MD   busPIRone (BUSPAR) tablet 7.5 mg, 7.5 mg, Oral, BID, Wouk, Wilfred Curtis, MD   furosemide (LASIX) injection 80 mg, 80 mg, Intravenous, Once, Wouk, Wilfred Curtis, MD   metoprolol tartrate (LOPRESSOR) tablet 12.5 mg, 12.5 mg, Oral, BID, Wouk, Wilfred Curtis, MD   oxyCODONE (Oxy IR/ROXICODONE) immediate release tablet 5 mg, 5 mg, Oral, Q6H PRN, Wouk, Wilfred Curtis, MD  sertraline (ZOLOFT) tablet 25 mg, 25 mg, Oral, Daily, Wouk, Wilfred Curtis, MD   sertraline (ZOLOFT) tablet 50 mg, 50 mg, Oral, Daily, Wouk, Wilfred Curtis, MD  Current Outpatient Medications:    acetaminophen (TYLENOL) 325 MG tablet, Take 2 tablets (650 mg total) by mouth every 4 (four) hours as needed for mild pain (or temp > 37.5 C (99.5 F))., Disp: , Rfl:    apixaban (ELIQUIS) 5 MG TABS tablet, Take 1 tablet (5 mg total) by mouth 2 (two) times daily., Disp: 60 tablet, Rfl: 1   busPIRone (BUSPAR) 7.5 MG tablet, Take 1 tablet (7.5 mg total) by mouth 2 (two) times daily., Disp: 180 tablet, Rfl: 1   dapagliflozin propanediol (FARXIGA) 10 MG TABS tablet, Take 1 tablet (10 mg total) by mouth daily., Disp: 30 tablet, Rfl: 0   diclofenac Sodium (VOLTAREN) 1 % GEL, Apply 4 g topically 4 (four) times daily., Disp: , Rfl:    losartan (COZAAR) 25 MG tablet, Take 1 tablet (25 mg total) by mouth daily., Disp: 30 tablet, Rfl: 1   metoprolol tartrate (LOPRESSOR) 25 MG tablet, Take 0.5 tablets (12.5 mg total) by mouth 2 (two) times daily., Disp: 60 tablet, Rfl: 1   ondansetron (ZOFRAN-ODT) 8 MG disintegrating tablet, Take 1 tablet (8 mg total) by mouth every 8 (eight) hours as needed for nausea or vomiting., Disp: 60 tablet, Rfl: 1   atorvastatin (LIPITOR) 40 MG tablet, Take 1 tablet (40 mg total) by mouth daily at 6 PM., Disp: 90 tablet, Rfl: 3   Continuous Glucose Sensor (FREESTYLE LIBRE 2 SENSOR) MISC, Place 1 sensor on the skin every 14 days. Use to check glucose continuously, Disp: 2 each, Rfl: 11   Continuous Glucose Sensor  (FREESTYLE LIBRE 3 SENSOR) MISC, Place 1 sensor on the skin every 14 days. Use to check glucose continuously, Disp: 2 each, Rfl: 11   dexamethasone (DECADRON) 4 MG tablet, TAKE 2 TABLETS(8 MG) BY MOUTH DAILY. START THE DAY AFTER CHEMOTHERAPY FOR 2 DAYS. TAKE WITH FOOD, Disp: 30 tablet, Rfl: 1   feeding supplement (ENSURE ENLIVE / ENSURE PLUS) LIQD, Take 237 mLs by mouth 2 (two) times daily between meals., Disp: , Rfl:    oxyCODONE (OXY IR/ROXICODONE) 5 MG immediate release tablet, Take 1 tablet (5 mg total) by mouth every 6 (six) hours as needed for severe pain (pain score 7-10). (Patient not taking: Reported on 04/30/2023), Disp: 60 tablet, Rfl: 0   pantoprazole (PROTONIX) 40 MG tablet, Take 1 tablet (40 mg total) by mouth 2 (two) times daily before a meal., Disp: 60 tablet, Rfl: 3   prochlorperazine (COMPAZINE) 10 MG tablet, Take 1 tablet (10 mg total) by mouth every 6 (six) hours as needed for nausea or vomiting., Disp: 60 tablet, Rfl: 0   sertraline (ZOLOFT) 25 MG tablet, Take 1 tablet (25 mg total) by mouth daily. (Patient taking differently: Take 25 mg by mouth daily. Take along with one 50 mg tablet for total 75 mg once daily), Disp: 30 tablet, Rfl: 3   sertraline (ZOLOFT) 50 MG tablet, Take 1 tablet (50 mg total) by mouth daily., Disp: 30 tablet, Rfl: 3   torsemide (DEMADEX) 10 MG tablet, Take 1 tablet (10 mg total) by mouth daily as needed (lower ext swelling)., Disp: 30 tablet, Rfl: 0  Review of Systems  Constitutional:  Negative for chills, diaphoresis, fatigue, fever and unexpected weight change.  HENT:   Negative for hearing loss, lump/mass, nosebleeds and sore throat.   Eyes:  Negative for eye  problems and icterus.  Respiratory:  Positive for chest tightness and shortness of breath. Negative for cough, hemoptysis and wheezing.   Cardiovascular:  Positive for leg swelling. Negative for chest pain.  Gastrointestinal:  Negative for abdominal distention, abdominal pain, blood in stool,  diarrhea, nausea and rectal pain.  Endocrine: Negative for hot flashes.  Genitourinary:  Negative for bladder incontinence, difficulty urinating, dysuria, frequency, hematuria and nocturia.   Musculoskeletal:  Negative for back pain, flank pain, gait problem and myalgias.  Skin:  Negative for rash.  Neurological:  Negative for dizziness, gait problem, headaches, numbness and seizures.  Hematological:  Negative for adenopathy. Does not bruise/bleed easily.  Psychiatric/Behavioral:  Negative for confusion and decreased concentration. The patient is not nervous/anxious.     PHYSICAL EXAM Vitals:   04/30/23 0837 04/30/23 0900 04/30/23 1034 04/30/23 1053  BP: (!) 126/93 123/79 133/74 115/71  Pulse: 84 93 91 92  Resp: (!) 22 (!) 27    Temp: 97.9 F (36.6 C)     TempSrc: Oral     SpO2: 96% 95% 91% 90%  Weight:      Height:       Physical Exam Constitutional:      General: He is not in acute distress.    Appearance: He is not diaphoretic.  HENT:     Head: Normocephalic and atraumatic.     Nose: Nose normal.  Eyes:     General: No scleral icterus. Cardiovascular:     Rate and Rhythm: Normal rate and regular rhythm.     Heart sounds: No murmur heard. Pulmonary:     Effort: Pulmonary effort is normal. No respiratory distress.     Comments: Decreased breath sound bilateral lower lobes Abdominal:     General: There is no distension.     Palpations: Abdomen is soft.     Tenderness: There is no abdominal tenderness.  Musculoskeletal:        General: Normal range of motion.     Cervical back: Normal range of motion and neck supple.  Skin:    General: Skin is warm and dry.     Findings: No erythema.  Neurological:     Mental Status: He is alert and oriented to person, place, and time. Mental status is at baseline.     Motor: No abnormal muscle tone.  Psychiatric:        Mood and Affect: Mood and affect normal.       LABORATORY STUDIES    Latest Ref Rng & Units 04/29/2023     4:55 PM 04/27/2023    4:32 AM 04/24/2023    6:22 AM  CBC  WBC 4.0 - 10.5 K/uL 5.9  4.6  7.0   Hemoglobin 13.0 - 17.0 g/dL 40.9  81.1  91.4   Hematocrit 39.0 - 52.0 % 35.9  34.7  36.2   Platelets 150 - 400 K/uL 182  197  214       Latest Ref Rng & Units 04/29/2023    4:55 PM 04/27/2023    4:32 AM 04/26/2023    4:35 AM  CMP  Glucose 70 - 99 mg/dL 782  956  213   BUN 6 - 20 mg/dL 54  46  46   Creatinine 0.61 - 1.24 mg/dL 0.86  5.78  4.69   Sodium 135 - 145 mmol/L 137  138  136   Potassium 3.5 - 5.1 mmol/L 4.4  3.7  4.0   Chloride 98 - 111 mmol/L 102  107  103   CO2 22 - 32 mmol/L 20  20  22    Calcium 8.9 - 10.3 mg/dL 8.6  8.7  8.9      RADIOGRAPHIC STUDIES: I have personally reviewed the radiological images as listed and agreed with the findings in the report. DG Chest Port 1 View Result Date: 04/30/2023 CLINICAL DATA:  Status post thoracentesis EXAM: PORTABLE CHEST 1 VIEW COMPARISON:  CT chest dated April 30, 2023. Chest radiograph dated April 29, 2023. FINDINGS: Status post thoracentesis with moderate-to-large left pleural effusion, decreased in size compared to the prior exam. There is improved aeration of the left upper lung. Persistent left basilar atelectasis. Small to moderate-sized layering right pleural effusion with associated basilar atelectasis. No pneumothorax. Central pulmonary vascular congestion. Similar enlargement of the cardiac silhouette. Stable right chest Port-A-Cath in place. Visualized osseous structures are unchanged. IMPRESSION: 1. Status post thoracentesis with decreased size of a moderate-to-large left pleural effusion and improved aeration within the left upper lung. Left basilar atelectasis. 2. Small to moderate-sized layering right pleural effusion with associated basilar atelectasis. 3. Central pulmonary vascular congestion. Electronically Signed   By: Hart Robinsons M.D.   On: 04/30/2023 12:10   US THORACENTESIS ASP PLEURAL SPACE W/IMG GUIDE Result Date:  04/30/2023 INDICATION: Patient with history of CHF, CKD3, cirrhosis, stage 4 gastric adenocarcinoma admitted for chest pain and dyspnea. Imaging shows new bilateral pleural effusions. Request for diagnostic and therapeutic thoracentesis. EXAM: ULTRASOUND GUIDED LEFT THORACENTESIS MEDICATIONS: 7 mL 1% lidocaine COMPLICATIONS: None immediate. PROCEDURE: An ultrasound guided thoracentesis was thoroughly discussed with the patient and questions answered. The benefits, risks, alternatives and complications were also discussed. The patient understands and wishes to proceed with the procedure. Written consent was obtained. Ultrasound was performed to localize and mark an adequate pocket of fluid in the left chest. The area was then prepped and draped in the normal sterile fashion. 1% Lidocaine was used for local anesthesia. Under ultrasound guidance a 6 Fr Safe-T-Centesis catheter was introduced. Thoracentesis was performed. The catheter was removed and a dressing applied. FINDINGS: A total of approximately 1.5 L of clear yellow fluid was removed. Samples were sent to the laboratory as requested by the clinical team. IMPRESSION: Successful ultrasound guided left thoracentesis yielding 1.5 L of pleural fluid. Performed by Lynnette Caffey, PA-C Electronically Signed   By: Malachy Moan M.D.   On: 04/30/2023 12:04   CT Angio Chest PE W and/or Wo Contrast Result Date: 04/30/2023 CLINICAL DATA:  53 year old male with history of shortness of breath and hypoxia. Left-sided chest pain. History of gastric cancer. * Tracking Code: BO * EXAM: CT ANGIOGRAPHY CHEST WITH CONTRAST TECHNIQUE: Multidetector CT imaging of the chest was performed using the standard protocol during bolus administration of intravenous contrast. Multiplanar CT image reconstructions and MIPs were obtained to evaluate the vascular anatomy. RADIATION DOSE REDUCTION: This exam was performed according to the departmental dose-optimization program which  includes automated exposure control, adjustment of the mA and/or kV according to patient size and/or use of iterative reconstruction technique. CONTRAST:  50mL OMNIPAQUE IOHEXOL 350 MG/ML SOLN COMPARISON:  CT of the chest, abdomen and pelvis 04/10/2023. FINDINGS: Cardiovascular: Study is slightly limited by suboptimal contrast bolus. With these limitations in mind, there is no central, lobar or segmental sized filling defect in the pulmonary arterial tree to indicate clinically relevant pulmonary embolism. Smaller subsegmental sized pulmonary emboli can not be entirely excluded. Heart size is normal. Moderate to large pericardial effusion. No definite pericardial calcification. There is aortic atherosclerosis, as  well as atherosclerosis of the great vessels of the mediastinum and the coronary arteries, including calcified atherosclerotic plaque in the left anterior descending coronary artery. Mild calcifications of the aortic valve and mitral annulus. Right internal jugular single-lumen porta cath with tip terminating at the superior cavoatrial junction. Mediastinum/Nodes: No pathologically enlarged mediastinal or hilar lymph nodes. Esophagus is unremarkable in appearance. No axillary lymphadenopathy. Lungs/Pleura: Large bilateral pleural effusions (left-greater-than-right), predominantly lying dependently. Associated with these is extensive passive atelectasis in the lungs bilaterally. No definite consolidative airspace disease. Mild diffuse ground-glass attenuation and mild interlobular septal thickening in the lungs suggesting a background of mild interstitial pulmonary edema. Several small scattered pulmonary nodules are noted measuring 5 mm or less in size, similar to several prior examinations dating back to 2023, favored to be benign. No larger more suspicious appearing pulmonary nodules or masses are noted. Upper Abdomen: Aortic atherosclerosis. Musculoskeletal: There are no aggressive appearing lytic or  blastic lesions noted in the visualized portions of the skeleton. Review of the MIP images confirms the above findings. IMPRESSION: 1. No central, lobar or segmental sized pulmonary embolism. 2. Moderate to large pericardial effusion. 3. Large bilateral pleural effusions (left-greater-than-right) with extensive passive atelectasis in the lungs bilaterally. 4. Mild interstitial pulmonary edema. 5. Aortic atherosclerosis, in addition to left anterior descending coronary artery disease. 6. There are calcifications of the aortic valve and mitral annulus. Echocardiographic correlation for evaluation of potential valvular dysfunction may be warranted if clinically indicated. 7. Small pulmonary nodules measuring 5 mm or less in size, stable compared to prior examinations, favored to be benign (although continued attention on future follow-up imaging is recommended given the patient's history of gastric cancer). Aortic Atherosclerosis (ICD10-I70.0). Electronically Signed   By: Trudie Reed M.D.   On: 04/30/2023 07:24   DG Chest 2 View Result Date: 04/29/2023 CLINICAL DATA:  Chest pain EXAM: CHEST - 2 VIEW COMPARISON:  04/23/2023 FINDINGS: Persistent consolidation/atelectasis at the left lung base with probable adjacent effusion. Some increase in perihilar pulmonary vascular congestion suspected. Mild thickening of or fluid in the interlobar fissures as before. No pneumothorax. Stable right IJ port catheter. Heart size and mediastinal contours are within normal limits. Visualized bones unremarkable. IMPRESSION: 1. Persistent left basilar consolidation/atelectasis with probable adjacent effusion. 2. Increased perihilar pulmonary vascular congestion. Electronically Signed   By: Corlis Leak M.D.   On: 04/29/2023 17:24   ECHOCARDIOGRAM COMPLETE Result Date: 04/24/2023    ECHOCARDIOGRAM REPORT   Patient Name:   SKEETER SHEARD Date of Exam: 04/24/2023 Medical Rec #:  [id number]       Height:       71.0 in Accession #:     [id number]      Weight:       245.0 lb Date of Birth:  Apr 01, 1971       BSA:          2.298 m Patient Age:    52 years        BP:           157/91 mmHg Patient Gender: M               HR:           90 bpm. Exam Location:  [facility] Procedure: 2D Echo, Cardiac Doppler and Color Doppler Indications:     CHF  History:         Patient has prior history of Echocardiogram examinations, most  recent 11/22/2022. CHF, Stroke, Arrythmias:Atrial Fibrillation,                  Signs/Symptoms:Fatigue and Shortness of Breath; Risk                  Factors:Hypertension, Diabetes, Dyslipidemia and Sleep Apnea.                  CKD.  Sonographer:     Mikki Harbor Referring Phys:  [id number] Tresa Endo A GRIFFITH Diagnosing Phys: Julien Nordmann MD  Sonographer Comments: Patient is obese. IMPRESSIONS  1. Left ventricular ejection fraction, by estimation, is 40 to 45%. The left ventricle has mildly decreased function. The left ventricle demonstrates global hypokinesis. There is moderate left ventricular hypertrophy. Left ventricular diastolic parameters are indeterminate.  2. Right ventricular systolic function is mildly reduced. The right ventricular size is normal.  3. Left atrial size was moderately dilated.  4. A small pericardial effusion is present.  5. The mitral valve is normal in structure. Mild mitral valve regurgitation. No evidence of mitral stenosis.  6. The aortic valve is normal in structure. Aortic valve regurgitation is not visualized. No aortic stenosis is present.  7. There is mild dilatation of the aortic root, measuring 41 mm. There is mild dilatation of the ascending aorta, measuring 42 mm.  8. The inferior vena cava is normal in size with greater than 50% respiratory variability, suggesting right atrial pressure of 3 mmHg. FINDINGS  Left Ventricle: Left ventricular ejection fraction, by estimation, is 40 to 45%. The left ventricle has mildly decreased function. The left ventricle demonstrates global  hypokinesis. The left ventricular internal cavity size was normal in size. There is  moderate left ventricular hypertrophy. Left ventricular diastolic parameters are indeterminate. Right Ventricle: The right ventricular size is normal. No increase in right ventricular wall thickness. Right ventricular systolic function is mildly reduced. Left Atrium: Left atrial size was moderately dilated. Right Atrium: Right atrial size was normal in size. Pericardium: A small pericardial effusion is present. Mitral Valve: The mitral valve is normal in structure. Mild mitral annular calcification. Mild mitral valve regurgitation. No evidence of mitral valve stenosis. MV peak gradient, 4.4 mmHg. The mean mitral valve gradient is 2.0 mmHg. Tricuspid Valve: The tricuspid valve is normal in structure. Tricuspid valve regurgitation is not demonstrated. No evidence of tricuspid stenosis. Aortic Valve: The aortic valve is normal in structure. Aortic valve regurgitation is not visualized. No aortic stenosis is present. Aortic valve mean gradient measures 2.7 mmHg. Aortic valve peak gradient measures 5.0 mmHg. Aortic valve area, by VTI measures 3.00 cm. Pulmonic Valve: The pulmonic valve was normal in structure. Pulmonic valve regurgitation is not visualized. No evidence of pulmonic stenosis. Aorta: The aortic root is normal in size and structure. There is mild dilatation of the aortic root, measuring 41 mm. There is mild dilatation of the ascending aorta, measuring 42 mm. Venous: The inferior vena cava is normal in size with greater than 50% respiratory variability, suggesting right atrial pressure of 3 mmHg. IAS/Shunts: No atrial level shunt detected by color flow Doppler.  LEFT VENTRICLE PLAX 2D LVIDd:         5.40 cm LVIDs:         2.70 cm LV PW:         1.50 cm LV IVS:        1.60 cm LVOT diam:     2.20 cm LV SV:         60 LV SV  Index:   26 LVOT Area:     3.80 cm  RIGHT VENTRICLE RV Basal diam:  3.80 cm RV Mid diam:    3.70 cm LEFT  ATRIUM             Index        RIGHT ATRIUM           Index LA diam:        4.90 cm 2.13 cm/m   RA Area:     20.10 cm LA Vol (A2C):   73.9 ml 32.15 ml/m  RA Volume:   57.10 ml  24.84 ml/m LA Vol (A4C):   97.4 ml 42.38 ml/m LA Biplane Vol: 87.3 ml 37.98 ml/m  AORTIC VALVE                    PULMONIC VALVE AV Area (Vmax):    3.17 cm     PV Vmax:       0.88 m/s AV Area (Vmean):   2.72 cm     PV Peak grad:  3.1 mmHg AV Area (VTI):     3.00 cm AV Vmax:           111.67 cm/s AV Vmean:          77.900 cm/s AV VTI:            0.200 m AV Peak Grad:      5.0 mmHg AV Mean Grad:      2.7 mmHg LVOT Vmax:         93.25 cm/s LVOT Vmean:        55.650 cm/s LVOT VTI:          0.158 m LVOT/AV VTI ratio: 0.79  AORTA Ao Root diam: 4.10 cm Ao Asc diam:  4.20 cm MITRAL VALVE MV Area (PHT): 3.65 cm    SHUNTS MV Area VTI:   2.63 cm    Systemic VTI:  0.16 m MV Peak grad:  4.4 mmHg    Systemic Diam: 2.20 cm MV Mean grad:  2.0 mmHg MV Vmax:       1.05 m/s MV Vmean:      58.7 cm/s MV Decel Time: 208 msec MV E velocity: 84.00 cm/s Julien Nordmann MD Electronically signed by Julien Nordmann MD Signature Date/Time: 04/24/2023/1:11:41 PM    Final    DG Chest 2 View Result Date: 04/23/2023 CLINICAL DATA:  Congestion, shortness of breath.  COPD EXAM: CHEST - 2 VIEW COMPARISON:  10/27/2022 FINDINGS: Right Port-A-Cath in place with the tip at the cavoatrial junction. Cardiomegaly with vascular congestion. Left pleural effusion with perihilar and lower lobe airspace opacities, left greater than right. No acute bony abnormality. IMPRESSION: Cardiomegaly with vascular congestion and bilateral perihilar opacities and lower lobe opacities, left greater than right. Favor asymmetric edema. Cannot exclude pneumonia. Small to moderate left pleural effusion. Electronically Signed   By: Charlett Nose M.D.   On: 04/23/2023 17:40   CT CHEST ABDOMEN PELVIS WO CONTRAST Result Date: 04/15/2023 CLINICAL DATA:  Restaging gastric cancer EXAM: CT CHEST,  ABDOMEN AND PELVIS WITHOUT CONTRAST TECHNIQUE: Multidetector CT imaging of the chest, abdomen and pelvis was performed following the standard protocol without IV contrast. RADIATION DOSE REDUCTION: This exam was performed according to the departmental dose-optimization program which includes automated exposure control, adjustment of the mA and/or kV according to patient size and/or use of iterative reconstruction technique. COMPARISON:  PET-CT dated 12/31/2022 FINDINGS: CT CHEST FINDINGS Cardiovascular: Mild cardiomegaly.  Small pericardial effusion,  new. No evidence of thoracic aortic aneurysm. Atherosclerotic calcifications of the aortic arch. Mild coronary atherosclerosis of the LAD and right coronary artery. Right chest port terminates at the cavoatrial junction. Mediastinum/Nodes: Dominant 12 mm short axis AP window node, similar. However, additional mediastinal nodes are favored to be mildly progressive, including a 2 mm short axis low right paratracheal node (series 2/image 23) and new prevascular nodes measuring up to 7 mm short axis (series 2/image 21). Visualized thyroid is unremarkable. Lungs/Pleura: 5 mm triangular subpleural nodule in the right lower lobe (series 4/image 109), unchanged, likely benign. Additional subpleural nodularity in the right upper and right middle lobes, measuring up to 5 mm, benign. No new/suspicious pulmonary nodules. Mild centrilobular emphysematous changes, upper lung predominant. Moderate left and small to moderate right pleural effusions, new. Associated lingular and left lower lobe compressive atelectasis. No pneumothorax. Musculoskeletal: Degenerative changes of the lower thoracic spine. CT ABDOMEN PELVIS FINDINGS Hepatobiliary: Unenhanced liver is notable for a mildly macronodular contour but no focal hepatic lesion. Cholelithiasis, without associated inflammatory changes. No intrahepatic or extrahepatic duct dilatation. Pancreas: Within normal limits. Spleen: Within  normal limits. Adrenals/Urinary Tract: Adrenal glands are within normal limits. Kidneys are within normal limits. Renal vascular calcifications. Mild left hydronephrosis with narrowing at the UVJ. Thick-walled bladder, although underdistended. 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 No evidence of bowel obstruction. Appendix is not discretely visualized. Sigmoid diverticulosis, without evidence of diverticulitis. Large complex midline lower abdominal ventral hernia containing multiple loops of nondilated bowel (series 2/image 106). Vascular/Lymphatic: No evidence of abdominal aortic aneurysm. Atherosclerotic calcifications of the abdominal aorta and branch vessels, although vessels remain patent. Perigastric soft tissue implant measuring 3.2 x 2.3 cm (series 2/image 53), previously 3.7 x 2.1 cm, favored to be mildly improved. Additional small periaortic/retroperitoneal nodes measuring up to 7 mm short axis (series 2/image 89), grossly unchanged. Reproductive: Prostate is unremarkable. Other: Trace pelvic ascites, new. Musculoskeletal: Degenerative changes of the lumbar spine. 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 Mediastinal, perigastric, and retroperitoneal nodal metastases, overall grossly unchanged, although possibly mildly progressive in the chest. Moderate left and small to moderate right pleural effusions, new. Small pericardial effusion, new. Trace pelvic ascites, new. Additional stable ancillary findings as above. Aortic Atherosclerosis (ICD10-I70.0) and Emphysema (ICD10-J43.9). Electronically Signed   By: Charline Bills M.D.   On: 04/15/2023 00:24     Assessment and plan-   # Stage IV gastric cancer, status post first-line treatment with FOLFOX x 12 cycles  currently on 5-FU maintenance, most recent restaging CT shows overall stable disease. Long-term prognosis is poor as this is not a curable condition, although currently from oncology aspect, his disease is stable.  # Pericardial effusion and bilateral pleural effusion. I agree with diagnostic/therapeutic thoracentesis.  Check fluid studies as well as cytology. Suspect transudate due to acute CHF. Patient cardiology recommendation whether pericardial effusion need to be tapped or not. Appreciate cardiology insights of patient's prognosis from cardiovascular aspect.  Lengthy discussion with patient and sister.  They understand that his heart condition is a set back of his cancer treatments.  He will not be able to receive chemotherapy until acute cardiology issues resolves.  However cancer may progress without anticancer treatments. Patient's sister would like to continue current scope of care, waiting to see if patient will improve from cardiology aspect.  Discussed care with palliative  care service [facility] who also see patient for goals of care discussions.  # History of iron deficiency anemia, recently started on anticoagulation by cardiologist. His hemoglobin so far stable.  Monitor.  Thank you for allowing me to participate in the care of this patient.   Rickard Patience, MD, PhD Hematology Oncology 04/30/2023

## 2023-04-30 NOTE — ED Provider Notes (Signed)
[facility] Provider Note    Event Date/Time   First MD Initiated Contact with Patient 04/30/23 0559     (approximate)   History   Chest Pain   HPI  Jeremiah Vance is a 53 y.o. male presents to the emergency department today because of concerns for chest pain.  The patient states it started yesterday when he was bending over to tie his shoe.  He says that the pain then persisted although gradually got less severe and sharp and more pressure-like.  Patient did have a recent hospitalization for heart failure.  Has continued to have some swelling in his legs although denies any pain.     Physical Exam   Triage Vital Signs: ED Triage Vitals  Encounter Vitals Group     BP 04/29/23 1650 95/74     Systolic BP Percentile --      Diastolic BP Percentile --      Pulse Rate 04/29/23 1650 69     Resp 04/29/23 1650 18     Temp 04/29/23 1650 98.4 F (36.9 C)     Temp Source 04/29/23 1650 Oral     SpO2 04/29/23 1650 93 %     Weight 04/29/23 1659 233 lb 11 oz (106 kg)     Height 04/29/23 1659 5\' 11"  (1.803 m)     Head Circumference --      Peak Flow --      Pain Score 04/29/23 1700 6     Pain Loc --      Pain Education --      Exclude from Growth Chart --     Most recent vital signs: Vitals:   04/30/23 0552 04/30/23 0630  BP: 108/85 (!) 130/102  Pulse: 81 80  Resp: 17 16  Temp: 97.8 F (36.6 C)   SpO2: 90% 97%   General: Awake, alerted.  CV:  Good peripheral perfusion. Regular rate and rhythm. Resp:  Normal effort. Poor air movement in lower lungs. Abd:  No distention.  Other:  Edema to bilateral lower legs.   ED Results / Procedures / Treatments   Labs (all labs ordered are listed, but only abnormal results are displayed) Labs Reviewed  BASIC METABOLIC PANEL - Abnormal; Notable for the following components:      Result Value   CO2 20 (*)    Glucose, Bld 156 (*)    BUN 54 (*)    Creatinine, Ser 2.36 (*)    Calcium 8.6 (*)    GFR,  Estimated 32 (*)    All other components within normal limits  CBC - Abnormal; Notable for the following components:   RBC 3.98 (*)    Hemoglobin 11.5 (*)    HCT 35.9 (*)    RDW 17.9 (*)    All other components within normal limits  TROPONIN I (HIGH SENSITIVITY) - Abnormal; Notable for the following components:   Troponin I (High Sensitivity) 55 (*)    All other components within normal limits  TROPONIN I (HIGH SENSITIVITY) - Abnormal; Notable for the following components:   Troponin I (High Sensitivity) 53 (*)    All other components within normal limits  RESP PANEL BY RT-PCR (RSV, FLU A&B, COVID)  [id number]     EKG  I, Phineas Semen, attending physician, personally viewed and interpreted this EKG  EKG Time: 1705 Rate: 74 Rhythm: atrial fibrillation Axis: left axis deviation Intervals: qtc 421 QRS: narrow, q waves v1 ST changes: no st elevation Impression:  abnormal ekg   RADIOLOGY I independently interpreted and visualized the CXR. My interpretation: cardiomegaly Radiology interpretation:  IMPRESSION:  1. Persistent left basilar consolidation/atelectasis with probable  adjacent effusion.  2. Increased perihilar pulmonary vascular congestion.       PROCEDURES:  Critical Care performed: No    MEDICATIONS ORDERED IN ED: Medications  iohexol (OMNIPAQUE) 350 MG/ML injection 50 mL (50 mLs Intravenous Contrast Given 04/30/23 0641)     IMPRESSION / MDM / ASSESSMENT AND PLAN / ED COURSE  I reviewed the triage vital signs and the nursing notes.                              Differential diagnosis includes, but is not limited to, viral URI, CHF, pneumonia, PE  Patient's presentation is most consistent with acute presentation with potential threat to life or bodily function.   The patient is on the cardiac monitor to evaluate for evidence of arrhythmia and/or significant heart rate changes.  Patient presented to the emergency department today because of concern  for chest pain. On exam patient with poor air movement to the lower lungs. Was found to be hypoxic here on room air so was placed on 2 liters. CXR without pneumonia. Given recent hospitalization, slightly elevated troponin and chest pain will check CT angio for PE. Awaiting CT at time of sign out.      FINAL CLINICAL IMPRESSION(S) / ED DIAGNOSES   Final diagnoses:  Chest pain, unspecified type  Elevated troponin      Note:  This document was prepared using Dragon voice recognition software and may include unintentional dictation errors.    Phineas Semen, MD 04/30/23 [phone]

## 2023-04-30 NOTE — Consult Note (Signed)
Palliative Medicine [facility] at [facility] Telephone:([phone] Fax:(336) [phone]   Name: Jeremiah Vance Date: 04/30/2023 MRN: [id number]  DOB: 26-Apr-1970  Patient Care Team: Debera Lat, Cordelia Poche as PCP - General (Physician Assistant) Debbe Odea, MD as PCP - Cardiology (Cardiology) Toney Reil, MD as Consulting Physician (Gastroenterology) Toney Reil, MD as Consulting Physician (Gastroenterology) Rickard Patience, MD as Consulting Physician (Oncology) Benita Gutter, RN as Oncology Nurse Navigator    REASON FOR CONSULTATION: Jeremiah Vance is a 53 y.o. male with multiple medical problems including history of recurrent CVA, CHF, OSA, cirrhosis, A-fib, CKD stage IIIb, and stage IV gastric adenocarcinoma, most recently on treatment with FOLFOX.  Patient was hospitalized 04/23/2023 to 04/28/2023 with CHF.  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  Patient now readmitted 04/30/2023 again with CHF.  Palliative care consulted to address goals.  SOCIAL HISTORY:     reports that he has never smoked. He has never used smokeless tobacco. He reports current alcohol use. He reports that he does not use drugs.  Patient is divorced.  He lives at home with his sister.  He has no children  ADVANCE DIRECTIVES:  On file  CODE STATUS: DNR  PAST MEDICAL HISTORY: Past Medical History:  Diagnosis Date   Acute ischemic stroke (HCC) 09/06/2017   Acute renal failure superimposed on stage 3a chronic kidney disease (HCC) 07/08/2019   Acute right-sided weakness    Allergies    Anasarca    Anemia 07/10/2017   Arthritis    Cancer (HCC)    stomach and esophageal cancer stage 4   Chicken pox    CKD (chronic kidney disease) stage 3, GFR 30-59 ml/min (HCC) 06/2017   Depression    Elevated troponin 07/08/2019   Heart failure with mid-range ejection fraction (HCC)    a. 06/2017 Echo: EF 40-45%; b. 07/2019 Echo:  EF 50-55%; c. 10/2021 Echo: EF 30-35%; d. 10/2022 Echo: EF 55-60%; e. 04/2023 Echo: EF 40-45%, glob HK, mod LVH, mildly reduced RV fxn, mod dil LA, mild MR, Ao root 41mm, asc Ao 42mm.   High cholesterol    History of cardiomyopathy    LVEF 40 to 45% in April 2019 - subsequently normalized   Hyperbilirubinemia 07/10/2017   Hypertension    Ischemic stroke (HCC)    Small left internal capsule infarct due to lacunar disease   Morbid obesity (HCC)    NICM (nonischemic cardiomyopathy) (HCC)    a. 06/2017 Echo: EF 40-45%; b. 07/2019 Echo: EF 50-55%; c. 10/2021 Echo: EF 30-35%; d. 12/2021 MV: EF 60%, no ischemia/scar; e. 10/2022 Echo: EF 55-60%; f. 04/2023 Echo: EF 40-45%.   Normocytic anemia 12/16/2017   Permanent atrial fibrillation (HCC)    a. CHA2DS2VASc = 5-->eliquis.   Recurrent incisional hernia with incarceration s/p repair 10/22/2017 10/21/2017   Stroke (HCC) 08/2017   "right sided weakness since; getting stronger though" (10/22/2017)   Type 2 diabetes mellitus (HCC)     PAST SURGICAL HISTORY:  Past Surgical History:  Procedure Laterality Date   ABDOMINAL HERNIA REPAIR  2008; 10/22/2017   "scope; OPEN REPAIR INCARCERATED VENTRAL HERNIA   ESOPHAGOGASTRODUODENOSCOPY (EGD) WITH PROPOFOL N/A 09/10/2022   Procedure: ESOPHAGOGASTRODUODENOSCOPY (EGD) WITH PROPOFOL;  Surgeon: Toney Reil, MD;  Location: [facility] ENDOSCOPY;  Service: Gastroenterology;  Laterality: N/A;   ESOPHAGOGASTRODUODENOSCOPY (EGD) WITH PROPOFOL N/A 09/28/2022   Procedure: ESOPHAGOGASTRODUODENOSCOPY (EGD) WITH PROPOFOL;  Surgeon: Jaynie Collins, DO;  Location: [facility] ENDOSCOPY;  Service: Gastroenterology;  Laterality: N/A;   HEMOSTASIS CONTROL  09/28/2022   Procedure: HEMOSTASIS CONTROL;  Surgeon: Jaynie Collins, DO;  Location: [facility] ENDOSCOPY;  Service: Gastroenterology;;   HERNIA REPAIR     KNEE ARTHROSCOPY Right 1989   PORTA CATH INSERTION N/A 09/25/2022   Procedure: PORTA CATH INSERTION;  Surgeon: Annice Needy,  MD;  Location: [facility] INVASIVE CV LAB;  Service: Cardiovascular;  Laterality: N/A;   VENTRAL HERNIA REPAIR N/A 10/22/2017   Procedure: OPEN REPAIR INCARCERATED VENTRAL HERNIA;  Surgeon: Glenna Fellows, MD;  Location: [facility] OR;  Service: General;  Laterality: N/A;    HEMATOLOGY/ONCOLOGY HISTORY:  Oncology History  Gastric cancer (HCC)  09/08/2022 Imaging   CT abdomen pelvis w contrast     09/08/2022 Imaging   CT abdomen pelvis with contrast  Diffuse masslike thickening in the area of the GE junction and upper stomach with the extensive abnormal lymph nodes throughout the abdomen including retrocrural, porta hepatis, retroperitoneum and lesser and greater curve of the stomach. In addition there are areas of suggest peritoneal carcinomatosis. Recommend further evaluation.    No bowel obstruction. Large midline anterior pelvic wall hernia involving small bowel and mesenteric fat with rectus muscle diastasis. Gallstones.   09/19/2022 Cancer Staging   Staging form: Stomach, AJCC 8th Edition - Clinical stage from 09/19/2022: Stage IVB (cTX, cN2, cM1) - Signed by Rickard Patience, MD on 09/19/2022 Stage prefix: Initial diagnosis   09/19/2022 Initial Diagnosis   Gastric cancer (HCC)  09/08/2022, patient presented emergency room for evaluation of abdominal pain and vomiting.  He has also had weight loss.  Dysphagia with solid food for couple of months.  CT abdomen pelvis showed diffuse masslike thickening of the GE junction and upper stomach with extensive abnormal lymph nodes throughout abdomen.  09/10/2022 upper endoscopy showed malignant appearance gastric tumor at the GE junction and in the cardia Biopsied.  Nodular mucosa in the second portion of duodenum.  Biopsied. GE junction mass showed moderate to poorly differentiated adenocarcinoma undermining squamous mucosa with focal area of squamous atypia.  Cannot exclude adenosquamous carcinoma.  Intestinal metaplasia is not identified. Duodenum cold  biopsy-reactive duodenitis.  Negative for dysplasia and malignancy. Stomach random biopsy negative for H. pylori, intestinal metaplasia, dysplasia and malignancy.  Stomach Cardia mass biopsy showed moderate to poorly differentiated adenocarcinoma.  Patient's case was discussed on tumor board on 09/19/2022.  Per Dr. Oneita Kras, nephrology findings of both GE junction mass and gastric cardia mass or similar. HER2 staining negative. PD-L1 TPS<1%, CPS 3, TMB 8.4, MSI can not be accessed.  KMT2D frame shift, GATA6 copy number gain.  Molecular testing is pending.     09/23/2022 Imaging   PET scan showed 1. 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 2. Enlarged cirrhotic liver. 3. Cholelithiasis. 4. Bilateral renal stones. 5. Umbilical hernias contain unobstructed small bowel and/or fat.  6. Aortic atherosclerosis (ICD10-I70.0). Coronary artery calcification. 7. Enlarged pulmonic trunk, indicative of pulmonary arterial hypertension.    10/02/2022 -  Chemotherapy   Patient is on Treatment Plan : GASTROESOPHAGEAL FOLFOX q14d     12/31/2022 Imaging   PET showed Significant interval response to therapy.   Residual masslike thickening at the gastric cardia, corresponding to the patient's known primary gastric carcinoma, with decreased hypermetabolic activity.   Near complete resolution of prior hypermetabolic thoracic and upper abdominal lymphadenopathy. Residual AP window and perigastric nodes.   Peritoneal disease is no longer evident.   04/10/2023 Imaging   CT chest abdomen pelvis  wo contrast showed Mild residual wall thickening involving the posterior gastric cardia, likely corresponding to the patient's known gastric adenocarcinoma.   Mediastinal, perigastric, and retroperitoneal nodal metastases,overall grossly unchanged, although possibly mildly progressive in the chest.   Moderate left and small to moderate right pleural effusions, new.Small  pericardial effusion, new. Trace pelvic ascites, new.   Additional stable ancillary findings as above.  Aortic Atherosclerosis (ICD10-I70.0) and Emphysema (ICD10-J43.9)     ALLERGIES:  is allergic to pollen extract and sulfa antibiotics.  MEDICATIONS:  Current Facility-Administered Medications  Medication Dose Route Frequency Provider Last Rate Last Admin   apixaban (ELIQUIS) tablet 5 mg  5 mg Oral BID Mila Merry A, RPH   5 mg at 04/30/23 1403   atorvastatin (LIPITOR) tablet 40 mg  40 mg Oral q1800 Wouk, Wilfred Curtis, MD       busPIRone (BUSPAR) tablet 7.5 mg  7.5 mg Oral BID Wouk, Wilfred Curtis, MD       metoprolol tartrate (LOPRESSOR) tablet 12.5 mg  12.5 mg Oral BID Wouk, Wilfred Curtis, MD       oxyCODONE (Oxy IR/ROXICODONE) immediate release tablet 5 mg  5 mg Oral Q6H PRN Wouk, Wilfred Curtis, MD       sertraline (ZOLOFT) tablet 25 mg  25 mg Oral Daily Wouk, Wilfred Curtis, MD       sertraline (ZOLOFT) tablet 50 mg  50 mg Oral Daily Wouk, Wilfred Curtis, MD       Current Outpatient Medications  Medication Sig Dispense Refill   acetaminophen (TYLENOL) 325 MG tablet Take 2 tablets (650 mg total) by mouth every 4 (four) hours as needed for mild pain (or temp > 37.5 C (99.5 F)).     apixaban (ELIQUIS) 5 MG TABS tablet Take 1 tablet (5 mg total) by mouth 2 (two) times daily. 60 tablet 1   busPIRone (BUSPAR) 7.5 MG tablet Take 1 tablet (7.5 mg total) by mouth 2 (two) times daily. 180 tablet 1   dapagliflozin propanediol (FARXIGA) 10 MG TABS tablet Take 1 tablet (10 mg total) by mouth daily. 30 tablet 0   diclofenac Sodium (VOLTAREN) 1 % GEL Apply 4 g topically 4 (four) times daily.     losartan (COZAAR) 25 MG tablet Take 1 tablet (25 mg total) by mouth daily. 30 tablet 1   metoprolol tartrate (LOPRESSOR) 25 MG tablet Take 0.5 tablets (12.5 mg total) by mouth 2 (two) times daily. 60 tablet 1   ondansetron (ZOFRAN-ODT) 8 MG disintegrating tablet Take 1 tablet (8 mg total) by mouth every 8 (eight)  hours as needed for nausea or vomiting. 60 tablet 1   pantoprazole (PROTONIX) 40 MG tablet Take 1 tablet (40 mg total) by mouth 2 (two) times daily before a meal. 60 tablet 3   prochlorperazine (COMPAZINE) 10 MG tablet Take 1 tablet (10 mg total) by mouth every 6 (six) hours as needed for nausea or vomiting. 60 tablet 0   sertraline (ZOLOFT) 25 MG tablet Take 1 tablet (25 mg total) by mouth daily. (Patient taking differently: Take 25 mg by mouth daily. Take along with one 50 mg tablet for total 75 mg once daily) 30 tablet 3   sertraline (ZOLOFT) 50 MG tablet Take 1 tablet (50 mg total) by mouth daily. 30 tablet 3   torsemide (DEMADEX) 10 MG tablet Take 1 tablet (10 mg total) by mouth daily as needed (lower ext swelling). 30 tablet 0   atorvastatin (LIPITOR) 40 MG tablet Take 1 tablet (40 mg total) by  mouth daily at 6 PM. 90 tablet 3   Continuous Glucose Sensor (FREESTYLE LIBRE 2 SENSOR) MISC Place 1 sensor on the skin every 14 days. Use to check glucose continuously 2 each 11   Continuous Glucose Sensor (FREESTYLE LIBRE 3 SENSOR) MISC Place 1 sensor on the skin every 14 days. Use to check glucose continuously 2 each 11   dexamethasone (DECADRON) 4 MG tablet TAKE 2 TABLETS(8 MG) BY MOUTH DAILY. START THE DAY AFTER CHEMOTHERAPY FOR 2 DAYS. TAKE WITH FOOD 30 tablet 1   feeding supplement (ENSURE ENLIVE / ENSURE PLUS) LIQD Take 237 mLs by mouth 2 (two) times daily between meals.     oxyCODONE (OXY IR/ROXICODONE) 5 MG immediate release tablet Take 1 tablet (5 mg total) by mouth every 6 (six) hours as needed for severe pain (pain score 7-10). (Patient not taking: Reported on 04/30/2023) 60 tablet 0    VITAL SIGNS: BP 115/71 (BP Location: Left Arm)   Pulse 92   Temp 97.9 F (36.6 C) (Oral)   Resp (!) 27   Ht 5\' 11"  (1.803 m)   Wt 233 lb 11 oz (106 kg)   SpO2 90%   BMI 32.59 kg/m  Filed Weights   04/29/23 1659  Weight: 233 lb 11 oz (106 kg)    Estimated body mass index is 32.59 kg/m as  calculated from the following:   Height as of this encounter: 5\' 11"  (1.803 m).   Weight as of this encounter: 233 lb 11 oz (106 kg).  LABS: CBC:    Component Value Date/Time   WBC 5.9 04/29/2023 1655   HGB 11.5 (L) 04/29/2023 1655   HGB 11.9 (L) 04/15/2023 0848   HGB 11.6 (L) 05/23/2022 1606   HCT 35.9 (L) 04/29/2023 1655   HCT 35.8 (L) 05/23/2022 1606   PLT 182 04/29/2023 1655   PLT 202 04/15/2023 0848   PLT 421 05/23/2022 1606   MCV 90.2 04/29/2023 1655   MCV 84 05/23/2022 1606   NEUTROABS 5.7 04/15/2023 0848   NEUTROABS 5.8 05/23/2022 1606   LYMPHSABS 0.8 04/15/2023 0848   LYMPHSABS 2.6 05/23/2022 1606   MONOABS 1.0 04/15/2023 0848   EOSABS 0.4 04/15/2023 0848   EOSABS 0.2 05/23/2022 1606   BASOSABS 0.1 04/15/2023 0848   BASOSABS 0.1 05/23/2022 1606   Comprehensive Metabolic Panel:    Component Value Date/Time   NA 137 04/29/2023 1655   NA 139 05/23/2022 1606   K 4.4 04/29/2023 1655   CL 102 04/29/2023 1655   CO2 20 (L) 04/29/2023 1655   BUN 54 (H) 04/29/2023 1655   BUN 29 (H) 05/23/2022 1606   CREATININE 2.36 (H) 04/29/2023 1655   CREATININE 2.21 (H) 04/15/2023 0848   GLUCOSE 156 (H) 04/29/2023 1655   CALCIUM 8.6 (L) 04/29/2023 1655   AST 18 04/15/2023 0848   ALT 11 04/15/2023 0848   ALKPHOS 112 04/15/2023 0848   BILITOT 1.0 04/15/2023 0848   PROT 6.9 04/15/2023 0848   PROT 6.9 05/23/2022 1606   ALBUMIN 3.0 (L) 04/15/2023 0848   ALBUMIN 3.9 05/23/2022 1606    RADIOGRAPHIC STUDIES: DG Chest Port 1 View Result Date: 04/30/2023 CLINICAL DATA:  Status post thoracentesis EXAM: PORTABLE CHEST 1 VIEW COMPARISON:  CT chest dated April 30, 2023. Chest radiograph dated April 29, 2023. FINDINGS: Status post thoracentesis with moderate-to-large left pleural effusion, decreased in size compared to the prior exam. There is improved aeration of the left upper lung. Persistent left basilar atelectasis. Small to moderate-sized layering right  pleural effusion with  associated basilar atelectasis. No pneumothorax. Central pulmonary vascular congestion. Similar enlargement of the cardiac silhouette. Stable right chest Port-A-Cath in place. Visualized osseous structures are unchanged. IMPRESSION: 1. Status post thoracentesis with decreased size of a moderate-to-large left pleural effusion and improved aeration within the left upper lung. Left basilar atelectasis. 2. Small to moderate-sized layering right pleural effusion with associated basilar atelectasis. 3. Central pulmonary vascular congestion. Electronically Signed   By: Hart Robinsons M.D.   On: 04/30/2023 12:10   US THORACENTESIS ASP PLEURAL SPACE W/IMG GUIDE Result Date: 04/30/2023 INDICATION: Patient with history of CHF, CKD3, cirrhosis, stage 4 gastric adenocarcinoma admitted for chest pain and dyspnea. Imaging shows new bilateral pleural effusions. Request for diagnostic and therapeutic thoracentesis. EXAM: ULTRASOUND GUIDED LEFT THORACENTESIS MEDICATIONS: 7 mL 1% lidocaine COMPLICATIONS: None immediate. PROCEDURE: An ultrasound guided thoracentesis was thoroughly discussed with the patient and questions answered. The benefits, risks, alternatives and complications were also discussed. The patient understands and wishes to proceed with the procedure. Written consent was obtained. Ultrasound was performed to localize and mark an adequate pocket of fluid in the left chest. The area was then prepped and draped in the normal sterile fashion. 1% Lidocaine was used for local anesthesia. Under ultrasound guidance a 6 Fr Safe-T-Centesis catheter was introduced. Thoracentesis was performed. The catheter was removed and a dressing applied. FINDINGS: A total of approximately 1.5 L of clear yellow fluid was removed. Samples were sent to the laboratory as requested by the clinical team. IMPRESSION: Successful ultrasound guided left thoracentesis yielding 1.5 L of pleural fluid. Performed by Lynnette Caffey, PA-C  Electronically Signed   By: Malachy Moan M.D.   On: 04/30/2023 12:04   CT Angio Chest PE W and/or Wo Contrast Result Date: 04/30/2023 CLINICAL DATA:  53 year old male with history of shortness of breath and hypoxia. Left-sided chest pain. History of gastric cancer. * Tracking Code: BO * EXAM: CT ANGIOGRAPHY CHEST WITH CONTRAST TECHNIQUE: Multidetector CT imaging of the chest was performed using the standard protocol during bolus administration of intravenous contrast. Multiplanar CT image reconstructions and MIPs were obtained to evaluate the vascular anatomy. RADIATION DOSE REDUCTION: This exam was performed according to the departmental dose-optimization program which includes automated exposure control, adjustment of the mA and/or kV according to patient size and/or use of iterative reconstruction technique. CONTRAST:  50mL OMNIPAQUE IOHEXOL 350 MG/ML SOLN COMPARISON:  CT of the chest, abdomen and pelvis 04/10/2023. FINDINGS: Cardiovascular: Study is slightly limited by suboptimal contrast bolus. With these limitations in mind, there is no central, lobar or segmental sized filling defect in the pulmonary arterial tree to indicate clinically relevant pulmonary embolism. Smaller subsegmental sized pulmonary emboli can not be entirely excluded. Heart size is normal. Moderate to large pericardial effusion. No definite pericardial calcification. There is aortic atherosclerosis, as well as atherosclerosis of the great vessels of the mediastinum and the coronary arteries, including calcified atherosclerotic plaque in the left anterior descending coronary artery. Mild calcifications of the aortic valve and mitral annulus. Right internal jugular single-lumen porta cath with tip terminating at the superior cavoatrial junction. Mediastinum/Nodes: No pathologically enlarged mediastinal or hilar lymph nodes. Esophagus is unremarkable in appearance. No axillary lymphadenopathy. Lungs/Pleura: Large bilateral pleural  effusions (left-greater-than-right), predominantly lying dependently. Associated with these is extensive passive atelectasis in the lungs bilaterally. No definite consolidative airspace disease. Mild diffuse ground-glass attenuation and mild interlobular septal thickening in the lungs suggesting a background of mild interstitial pulmonary edema. Several small scattered pulmonary nodules are  noted measuring 5 mm or less in size, similar to several prior examinations dating back to 2023, favored to be benign. No larger more suspicious appearing pulmonary nodules or masses are noted. Upper Abdomen: Aortic atherosclerosis. Musculoskeletal: There are no aggressive appearing lytic or blastic lesions noted in the visualized portions of the skeleton. Review of the MIP images confirms the above findings. IMPRESSION: 1. No central, lobar or segmental sized pulmonary embolism. 2. Moderate to large pericardial effusion. 3. Large bilateral pleural effusions (left-greater-than-right) with extensive passive atelectasis in the lungs bilaterally. 4. Mild interstitial pulmonary edema. 5. Aortic atherosclerosis, in addition to left anterior descending coronary artery disease. 6. There are calcifications of the aortic valve and mitral annulus. Echocardiographic correlation for evaluation of potential valvular dysfunction may be warranted if clinically indicated. 7. Small pulmonary nodules measuring 5 mm or less in size, stable compared to prior examinations, favored to be benign (although continued attention on future follow-up imaging is recommended given the patient's history of gastric cancer). Aortic Atherosclerosis (ICD10-I70.0). Electronically Signed   By: Trudie Reed M.D.   On: 04/30/2023 07:24   DG Chest 2 View Result Date: 04/29/2023 CLINICAL DATA:  Chest pain EXAM: CHEST - 2 VIEW COMPARISON:  04/23/2023 FINDINGS: Persistent consolidation/atelectasis at the left lung base with probable adjacent effusion. Some increase  in perihilar pulmonary vascular congestion suspected. Mild thickening of or fluid in the interlobar fissures as before. No pneumothorax. Stable right IJ port catheter. Heart size and mediastinal contours are within normal limits. Visualized bones unremarkable. IMPRESSION: 1. Persistent left basilar consolidation/atelectasis with probable adjacent effusion. 2. Increased perihilar pulmonary vascular congestion. Electronically Signed   By: Corlis Leak M.D.   On: 04/29/2023 17:24   ECHOCARDIOGRAM COMPLETE Result Date: 04/24/2023    ECHOCARDIOGRAM REPORT   Patient Name:   Jeremiah Vance Date of Exam: 04/24/2023 Medical Rec #:  [id number]       Height:       71.0 in Accession #:    [id number]      Weight:       245.0 lb Date of Birth:  [birth year]       BSA:          2.298 m Patient Age:    52 years        BP:           157/91 mmHg Patient Gender: M               HR:           90 bpm. Exam Location:  [facility] Procedure: 2D Echo, Cardiac Doppler and Color Doppler Indications:     CHF  History:         Patient has prior history of Echocardiogram examinations, most                  recent 11/22/2022. CHF, Stroke, Arrythmias:Atrial Fibrillation,                  Signs/Symptoms:Fatigue and Shortness of Breath; Risk                  Factors:Hypertension, Diabetes, Dyslipidemia and Sleep Apnea.                  CKD.  Sonographer:     Mikki Harbor Referring Phys:  [id number] Tresa Endo A GRIFFITH Diagnosing Phys: Julien Nordmann MD  Sonographer Comments: Patient is obese. IMPRESSIONS  1. Left ventricular ejection fraction, by estimation, is 40 to 45%. The left  ventricle has mildly decreased function. The left ventricle demonstrates global hypokinesis. There is moderate left ventricular hypertrophy. Left ventricular diastolic parameters are indeterminate.  2. Right ventricular systolic function is mildly reduced. The right ventricular size is normal.  3. Left atrial size was moderately dilated.  4. A small pericardial effusion is present.   5. The mitral valve is normal in structure. Mild mitral valve regurgitation. No evidence of mitral stenosis.  6. The aortic valve is normal in structure. Aortic valve regurgitation is not visualized. No aortic stenosis is present.  7. There is mild dilatation of the aortic root, measuring 41 mm. There is mild dilatation of the ascending aorta, measuring 42 mm.  8. The inferior vena cava is normal in size with greater than 50% respiratory variability, suggesting right atrial pressure of 3 mmHg. FINDINGS  Left Ventricle: Left ventricular ejection fraction, by estimation, is 40 to 45%. The left ventricle has mildly decreased function. The left ventricle demonstrates global hypokinesis. The left ventricular internal cavity size was normal in size. There is  moderate left ventricular hypertrophy. Left ventricular diastolic parameters are indeterminate. Right Ventricle: The right ventricular size is normal. No increase in right ventricular wall thickness. Right ventricular systolic function is mildly reduced. Left Atrium: Left atrial size was moderately dilated. Right Atrium: Right atrial size was normal in size. Pericardium: A small pericardial effusion is present. Mitral Valve: The mitral valve is normal in structure. Mild mitral annular calcification. Mild mitral valve regurgitation. No evidence of mitral valve stenosis. MV peak gradient, 4.4 mmHg. The mean mitral valve gradient is 2.0 mmHg. Tricuspid Valve: The tricuspid valve is normal in structure. Tricuspid valve regurgitation is not demonstrated. No evidence of tricuspid stenosis. Aortic Valve: The aortic valve is normal in structure. Aortic valve regurgitation is not visualized. No aortic stenosis is present. Aortic valve mean gradient measures 2.7 mmHg. Aortic valve peak gradient measures 5.0 mmHg. Aortic valve area, by VTI measures 3.00 cm. Pulmonic Valve: The pulmonic valve was normal in structure. Pulmonic valve regurgitation is not visualized. No evidence  of pulmonic stenosis. Aorta: The aortic root is normal in size and structure. There is mild dilatation of the aortic root, measuring 41 mm. There is mild dilatation of the ascending aorta, measuring 42 mm. Venous: The inferior vena cava is normal in size with greater than 50% respiratory variability, suggesting right atrial pressure of 3 mmHg. IAS/Shunts: No atrial level shunt detected by color flow Doppler.  LEFT VENTRICLE PLAX 2D LVIDd:         5.40 cm LVIDs:         2.70 cm LV PW:         1.50 cm LV IVS:        1.60 cm LVOT diam:     2.20 cm LV SV:         60 LV SV Index:   26 LVOT Area:     3.80 cm  RIGHT VENTRICLE RV Basal diam:  3.80 cm RV Mid diam:    3.70 cm LEFT ATRIUM             Index        RIGHT ATRIUM           Index LA diam:        4.90 cm 2.13 cm/m   RA Area:     20.10 cm LA Vol (A2C):   73.9 ml 32.15 ml/m  RA Volume:   57.10 ml  24.84 ml/m LA Vol (A4C):  97.4 ml 42.38 ml/m LA Biplane Vol: 87.3 ml 37.98 ml/m  AORTIC VALVE                    PULMONIC VALVE AV Area (Vmax):    3.17 cm     PV Vmax:       0.88 m/s AV Area (Vmean):   2.72 cm     PV Peak grad:  3.1 mmHg AV Area (VTI):     3.00 cm AV Vmax:           111.67 cm/s AV Vmean:          77.900 cm/s AV VTI:            0.200 m AV Peak Grad:      5.0 mmHg AV Mean Grad:      2.7 mmHg LVOT Vmax:         93.25 cm/s LVOT Vmean:        55.650 cm/s LVOT VTI:          0.158 m LVOT/AV VTI ratio: 0.79  AORTA Ao Root diam: 4.10 cm Ao Asc diam:  4.20 cm MITRAL VALVE MV Area (PHT): 3.65 cm    SHUNTS MV Area VTI:   2.63 cm    Systemic VTI:  0.16 m MV Peak grad:  4.4 mmHg    Systemic Diam: 2.20 cm MV Mean grad:  2.0 mmHg MV Vmax:       1.05 m/s MV Vmean:      58.7 cm/s MV Decel Time: 208 msec MV E velocity: 84.00 cm/s Julien Nordmann MD Electronically signed by Julien Nordmann MD Signature Date/Time: 04/24/2023/1:11:41 PM    Final    DG Chest 2 View Result Date: 04/23/2023 CLINICAL DATA:  Congestion, shortness of breath.  COPD EXAM: CHEST - 2 VIEW  COMPARISON:  10/27/2022 FINDINGS: Right Port-A-Cath in place with the tip at the cavoatrial junction. Cardiomegaly with vascular congestion. Left pleural effusion with perihilar and lower lobe airspace opacities, left greater than right. No acute bony abnormality. IMPRESSION: Cardiomegaly with vascular congestion and bilateral perihilar opacities and lower lobe opacities, left greater than right. Favor asymmetric edema. Cannot exclude pneumonia. Small to moderate left pleural effusion. Electronically Signed   By: Charlett Nose M.D.   On: 04/23/2023 17:40   CT CHEST ABDOMEN PELVIS WO CONTRAST Result Date: 04/15/2023 CLINICAL DATA:  Restaging gastric cancer EXAM: CT CHEST, ABDOMEN AND PELVIS WITHOUT CONTRAST TECHNIQUE: Multidetector CT imaging of the chest, abdomen and pelvis was performed following the standard protocol without IV contrast. RADIATION DOSE REDUCTION: This exam was performed according to the departmental dose-optimization program which includes automated exposure control, adjustment of the mA and/or kV according to patient size and/or use of iterative reconstruction technique. COMPARISON:  PET-CT dated 12/31/2022 FINDINGS: CT CHEST FINDINGS Cardiovascular: Mild cardiomegaly.  Small pericardial effusion, new. No evidence of thoracic aortic aneurysm. Atherosclerotic calcifications of the aortic arch. Mild coronary atherosclerosis of the LAD and right coronary artery. Right chest port terminates at the cavoatrial junction. Mediastinum/Nodes: Dominant 12 mm short axis AP window node, similar. However, additional mediastinal nodes are favored to be mildly progressive, including a 2 mm short axis low right paratracheal node (series 2/image 23) and new prevascular nodes measuring up to 7 mm short axis (series 2/image 21). Visualized thyroid is unremarkable. Lungs/Pleura: 5 mm triangular subpleural nodule in the right lower lobe (series 4/image 109), unchanged, likely benign. Additional subpleural nodularity  in the right upper and right middle lobes, measuring  up to 5 mm, benign. No new/suspicious pulmonary nodules. Mild centrilobular emphysematous changes, upper lung predominant. Moderate left and small to moderate right pleural effusions, new. Associated lingular and left lower lobe compressive atelectasis. No pneumothorax. Musculoskeletal: Degenerative changes of the lower thoracic spine. CT ABDOMEN PELVIS FINDINGS Hepatobiliary: Unenhanced liver is notable for a mildly macronodular contour but no focal hepatic lesion. Cholelithiasis, without associated inflammatory changes. No intrahepatic or extrahepatic duct dilatation. Pancreas: Within normal limits. Spleen: Within normal limits. Adrenals/Urinary Tract: Adrenal glands are within normal limits. Kidneys are within normal limits. Renal vascular calcifications. Mild left hydronephrosis with narrowing at the UVJ. Thick-walled bladder, although underdistended. 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 No evidence of bowel obstruction. Appendix is not discretely visualized. Sigmoid diverticulosis, without evidence of diverticulitis. Large complex midline lower abdominal ventral hernia containing multiple loops of nondilated bowel (series 2/image 106). Vascular/Lymphatic: No evidence of abdominal aortic aneurysm. Atherosclerotic calcifications of the abdominal aorta and branch vessels, although vessels remain patent. Perigastric soft tissue implant measuring 3.2 x 2.3 cm (series 2/image 53), previously 3.7 x 2.1 cm, favored to be mildly improved. Additional small periaortic/retroperitoneal nodes measuring up to 7 mm short axis (series 2/image 89), grossly unchanged. Reproductive: Prostate is unremarkable. Other: Trace pelvic ascites, new. Musculoskeletal: Degenerative changes of the lumbar spine. IMPRESSION: Mild residual wall thickening  involving the posterior gastric cardia, likely corresponding to the patient's known gastric adenocarcinoma. Mediastinal, perigastric, and retroperitoneal nodal metastases, overall grossly unchanged, although possibly mildly progressive in the chest. Moderate left and small to moderate right pleural effusions, new. Small pericardial effusion, new. Trace pelvic ascites, new. Additional stable ancillary findings as above. Aortic Atherosclerosis (ICD10-I70.0) and Emphysema (ICD10-J43.9). Electronically Signed   By: Charline Bills M.D.   On: 04/15/2023 00:24    PERFORMANCE STATUS (ECOG) : 2 - Symptomatic, <50% confined to bed  Review of Systems Unless otherwise noted, a complete review of systems is negative.  Physical Exam General: NAD Cardiovascular: Irregular Pulmonary: Unlabored Extremities: no edema, no joint deformities Skin: no rashes Neurological: Weakness but otherwise nonfocal  IMPRESSION: Patient seen in the ED.  His sister was at bedside.  Today, patient says that he is symptomatically improved.  His shortness of breath and chest pain have resolved.  He is being treated for CHF.  Had a long conversation with patient and sister regarding overall goals.  Both recognize that patient is doing poorly despite overall stable appearance of his gastric cancer.  Patient is not felt to be a candidate for further chemotherapy at this time.  We discussed possible hospice involvement.  Patient says that he is open to the idea if recommended by his oncologist.  Also discussed CODE STATUS.  Patient stated that he would not want to be resuscitated or have his life prolonged artificially on machines.  His sister agreed with DNR/DNI.  Patient also has a living will documented in the EMR.  PLAN: -Continue current scope of treatment -DNR/DNI -Will plan to follow outpatient  Case and plan discussed with Dr. Cathie Hoops  Time Total: 50 minutes  Visit consisted of counseling and education dealing with the  complex and emotionally intense issues of symptom management and palliative care in the setting of serious and potentially life-threatening illness.Greater than 50%  of this time was spent counseling and coordinating care related to the above assessment and plan.  Signed by: Laurette Schimke, PhD, NP-C

## 2023-04-30 NOTE — H&P (Addendum)
History and Physical    Jeremiah Vance ZOX:[id number] DOB: Sep 13, 1970 DOA: 04/30/2023  PCP: Debera Lat, PA-C  Patient coming from: home   Chief Complaint: chest pain and dyspnea  HPI: Jeremiah Vance is a 53 y.o. male with medical history significant for obesity, recurrent cva, HFrecoveredEF, htn, ckd 3b, t2dm, stage 4 gastric adenocarcinoma, osa, cirrhosis, a fib, who presents with the above.  Discharged 2 days ago, was admitted for chf exacerbation, was diuresed.  Reports chest pain, non-exertional, yesterday afternoon that led him to contact EMS. Chest pain resolved. Also reports dyspnea. No fever, has mild non-productive cough. Reports compliance with home meds last couple of days, is making urine. Stable cancer-related pain, no vomiting or diarrhea.    Review of Systems: As per HPI otherwise 10 point review of systems negative.    Past Medical History:  Diagnosis Date   Acute ischemic stroke (HCC) 09/06/2017   Acute renal failure superimposed on stage 3a chronic kidney disease (HCC) 07/08/2019   Acute right-sided weakness    Allergies    Anasarca    Anemia 07/10/2017   Arthritis    Cancer (HCC)    stomach and esophageal cancer stage 4   CHF (congestive heart failure) (HCC)    "coinsided w/kidney problems I was having 06/2017"   Chicken pox    CKD (chronic kidney disease) stage 3, GFR 30-59 ml/min (HCC) 06/2017   Depression    Elevated troponin 07/08/2019   High cholesterol    History of cardiomyopathy    LVEF 40 to 45% in April 2019 - subsequently normalized   Hyperbilirubinemia 07/10/2017   Hypertension    Ischemic stroke (HCC)    Small left internal capsule infarct due to lacunar disease   Morbid obesity (HCC)    Normocytic anemia 12/16/2017   Recurrent incisional hernia with incarceration s/p repair 10/22/2017 10/21/2017   Stroke (HCC) 08/2017   "right sided weakness since; getting stronger though" (10/22/2017)   Type 2 diabetes mellitus (HCC)     Past  Surgical History:  Procedure Laterality Date   ABDOMINAL HERNIA REPAIR  2008; 10/22/2017   "scope; OPEN REPAIR INCARCERATED VENTRAL HERNIA   ESOPHAGOGASTRODUODENOSCOPY (EGD) WITH PROPOFOL N/A 09/10/2022   Procedure: ESOPHAGOGASTRODUODENOSCOPY (EGD) WITH PROPOFOL;  Surgeon: Toney Reil, MD;  Location: [facility] ENDOSCOPY;  Service: Gastroenterology;  Laterality: N/A;   ESOPHAGOGASTRODUODENOSCOPY (EGD) WITH PROPOFOL N/A 09/28/2022   Procedure: ESOPHAGOGASTRODUODENOSCOPY (EGD) WITH PROPOFOL;  Surgeon: Jaynie Collins, DO;  Location: [facility] ENDOSCOPY;  Service: Gastroenterology;  Laterality: N/A;   HEMOSTASIS CONTROL  09/28/2022   Procedure: HEMOSTASIS CONTROL;  Surgeon: Jaynie Collins, DO;  Location: [facility] ENDOSCOPY;  Service: Gastroenterology;;   HERNIA REPAIR     KNEE ARTHROSCOPY Right 1989   PORTA CATH INSERTION N/A 09/25/2022   Procedure: PORTA CATH INSERTION;  Surgeon: Annice Needy, MD;  Location: [facility] INVASIVE CV LAB;  Service: Cardiovascular;  Laterality: N/A;   VENTRAL HERNIA REPAIR N/A 10/22/2017   Procedure: OPEN REPAIR INCARCERATED VENTRAL HERNIA;  Surgeon: Glenna Fellows, MD;  Location: [facility] OR;  Service: General;  Laterality: N/A;     reports that he has never smoked. He has never used smokeless tobacco. He reports current alcohol use. He reports that he does not use drugs.  Allergies  Allergen Reactions   Pollen Extract Other (See Comments)   Sulfa Antibiotics     Family History  Problem Relation Age of Onset   Diabetes Mother    Stroke Mother    Arthritis Mother  Depression Mother    Heart disease Mother    Hypertension Mother    Learning disabilities Mother    Mental illness Mother    Sleep apnea Mother    Diabetes Father    Heart disease Father    Arthritis Father    Hearing loss Father    Hyperlipidemia Father    Heart attack Father    Hypertension Father    Stroke Father    Diabetes Sister    Depression Sister    Diabetes Sister     Hypertension Sister    Mental illness Sister    Sleep apnea Sister    Diabetes Maternal Grandmother    Heart disease Maternal Grandmother    Depression Maternal Grandmother    Hyperlipidemia Maternal Grandmother    Hypertension Maternal Grandmother    Stroke Maternal Grandmother    Hypertension Maternal Grandfather    Hyperlipidemia Maternal Grandfather    Stroke Maternal Grandfather    Arthritis Paternal Grandmother    Hearing loss Paternal Grandmother    Stroke Paternal Grandfather    Heart disease Paternal Grandfather    Arthritis Paternal Grandfather    Heart attack Paternal Grandfather    Melanoma Other     Prior to Admission medications   Medication Sig Start Date End Date Taking? Authorizing Provider  acetaminophen (TYLENOL) 325 MG tablet Take 2 tablets (650 mg total) by mouth every 4 (four) hours as needed for mild pain (or temp > 37.5 C (99.5 F)). 12/06/22   Setzer, Lynnell Jude, PA-C  apixaban (ELIQUIS) 5 MG TABS tablet Take 1 tablet (5 mg total) by mouth 2 (two) times daily. 04/28/23   Pennie Banter, DO  atorvastatin (LIPITOR) 40 MG tablet Take 1 tablet (40 mg total) by mouth daily at 6 PM. 10/23/22   Ostwalt, Edmon Crape, PA-C  busPIRone (BUSPAR) 7.5 MG tablet Take 1 tablet (7.5 mg total) by mouth 2 (two) times daily. 02/03/23   Ostwalt, Edmon Crape, PA-C  Continuous Glucose Sensor (FREESTYLE LIBRE 2 SENSOR) MISC Place 1 sensor on the skin every 14 days. Use to check glucose continuously 10/23/22   Ostwalt, Edmon Crape, PA-C  Continuous Glucose Sensor (FREESTYLE LIBRE 3 SENSOR) MISC Place 1 sensor on the skin every 14 days. Use to check glucose continuously 02/20/23   Debera Lat, PA-C  dapagliflozin propanediol (FARXIGA) 10 MG TABS tablet Take 1 tablet (10 mg total) by mouth daily. 12/07/22   Setzer, Lynnell Jude, PA-C  dexamethasone (DECADRON) 4 MG tablet TAKE 2 TABLETS(8 MG) BY MOUTH DAILY. START THE DAY AFTER CHEMOTHERAPY FOR 2 DAYS. TAKE WITH FOOD 12/30/22   Rickard Patience, MD  diclofenac Sodium  (VOLTAREN) 1 % GEL Apply 4 g topically 4 (four) times daily. Patient taking differently: Apply 4 g topically 4 (four) times daily as needed. 12/06/22   Setzer, Lynnell Jude, PA-C  feeding supplement (ENSURE ENLIVE / ENSURE PLUS) LIQD Take 237 mLs by mouth 2 (two) times daily between meals. 04/28/23   Pennie Banter, DO  losartan (COZAAR) 25 MG tablet Take 1 tablet (25 mg total) by mouth daily. 04/28/23 04/27/24  Esaw Grandchild A, DO  metoprolol tartrate (LOPRESSOR) 25 MG tablet Take 0.5 tablets (12.5 mg total) by mouth 2 (two) times daily. 04/28/23     ondansetron (ZOFRAN-ODT) 8 MG disintegrating tablet Take 1 tablet (8 mg total) by mouth every 8 (eight) hours as needed for nausea or vomiting. 02/03/23   Rickard Patience, MD  oxyCODONE (OXY IR/ROXICODONE) 5 MG immediate release tablet Take 1 tablet (5  mg total) by mouth every 6 (six) hours as needed for severe pain (pain score 7-10). 02/04/23   Rickard Patience, MD  pantoprazole (PROTONIX) 40 MG tablet Take 1 tablet (40 mg total) by mouth 2 (two) times daily before a meal. 03/14/23   Rickard Patience, MD  prochlorperazine (COMPAZINE) 10 MG tablet Take 1 tablet (10 mg total) by mouth every 6 (six) hours as needed for nausea or vomiting. 10/10/22   Rickard Patience, MD  sertraline (ZOLOFT) 25 MG tablet Take 1 tablet (25 mg total) by mouth daily. Patient taking differently: Take 25 mg by mouth daily. Take along with one 50 mg tablet for total 75 mg once daily 02/20/23   Debera Lat, PA-C  sertraline (ZOLOFT) 50 MG tablet Take 1 tablet (50 mg total) by mouth daily. 02/03/23   Ostwalt, Edmon Crape, PA-C  torsemide (DEMADEX) 10 MG tablet Take 1 tablet (10 mg total) by mouth daily as needed (lower ext swelling). 12/06/22   Milinda Antis, PA-C    Physical Exam: Vitals:   04/29/23 2254 04/30/23 0450 04/30/23 0552 04/30/23 0630  BP: (!) 120/93 134/81 108/85 (!) 130/102  Pulse: 72 84 81 80  Resp: 18 18 17 16   Temp: 98.4 F (36.9 C) 98.2 F (36.8 C) 97.8 F (36.6 C)   TempSrc: Oral Oral Oral    SpO2: 95% 95% 90% 97%  Weight:      Height:        Constitutional: No acute distress, chronically ill appearing Head: Atraumatic Eyes: Conjunctiva clear ENM: Moist mucous membranes. Normal dentition.  Neck: Supple Respiratory: decreased breath sounds at bases Cardiovascular: irreg irreg, soft systolic murmur Abdomen: Non-tender, obese, soft Musculoskeletal: No joint deformity upper and lower extremities. Normal ROM, no contractures.   Skin: No rashes, lesions, or ulcers.  Extremities: pitting edema, moderate, of LEs Neurologic: slow to respond, right side weaker than left Psychiatric: calm   Labs on Admission: I have personally reviewed following labs and imaging studies  CBC: Recent Labs  Lab 04/23/23 1713 04/24/23 0622 04/27/23 0432 04/29/23 1655  WBC 6.0 7.0 4.6 5.9  HGB 12.4* 11.9* 11.3* 11.5*  HCT 38.8* 36.2* 34.7* 35.9*  MCV 88.2 84.8 86.8 90.2  PLT 209 214 197 182   Basic Metabolic Panel: Recent Labs  Lab 04/24/23 0622 04/25/23 0521 04/26/23 0435 04/27/23 0432 04/29/23 1655  NA 137 137 136 138 137  K 3.5 3.4* 4.0 3.7 4.4  CL 105 105 103 107 102  CO2 20* 19* 22 20* 20*  GLUCOSE 122* 112* 130* 105* 156*  BUN 48* 43* 46* 46* 54*  CREATININE 1.83* 1.90* 2.22* 2.11* 2.36*  CALCIUM 8.6* 9.0 8.9 8.7* 8.6*  MG  --  1.9  --   --   --    GFR: Estimated Creatinine Clearance: 44.9 mL/min (A) (by C-G formula based on SCr of 2.36 mg/dL (H)). Liver Function Tests: No results for input(s): "AST", "ALT", "ALKPHOS", "BILITOT", "PROT", "ALBUMIN" in the last 168 hours. No results for input(s): "LIPASE", "AMYLASE" in the last 168 hours. Recent Labs  Lab 04/24/23 2038  AMMONIA 16   Coagulation Profile: No results for input(s): "INR", "PROTIME" in the last 168 hours. Cardiac Enzymes: No results for input(s): "CKTOTAL", "CKMB", "CKMBINDEX", "TROPONINI" in the last 168 hours. BNP (last 3 results) No results for input(s): "PROBNP" in the last 8760 hours. HbA1C: No  results for input(s): "HGBA1C" in the last 72 hours. CBG: Recent Labs  Lab 04/27/23 1116 04/27/23 1634 04/27/23 1954 04/28/23 0738 04/28/23 1131  GLUCAP 139* 186* 141* 94 163*   Lipid Profile: No results for input(s): "CHOL", "HDL", "LDLCALC", "TRIG", "CHOLHDL", "LDLDIRECT" in the last 72 hours. Thyroid Function Tests: No results for input(s): "TSH", "T4TOTAL", "FREET4", "T3FREE", "THYROIDAB" in the last 72 hours. Anemia Panel: No results for input(s): "VITAMINB12", "FOLATE", "FERRITIN", "TIBC", "IRON", "RETICCTPCT" in the last 72 hours. Urine analysis:    Component Value Date/Time   COLORURINE STRAW (A) 09/27/2022 1100   APPEARANCEUR CLEAR (A) 09/27/2022 1100   LABSPEC 1.006 09/27/2022 1100   PHURINE 6.0 09/27/2022 1100   GLUCOSEU NEGATIVE 09/27/2022 1100   HGBUR NEGATIVE 09/27/2022 1100   BILIRUBINUR NEGATIVE 09/27/2022 1100   BILIRUBINUR negative 05/19/2020 1618   KETONESUR NEGATIVE 09/27/2022 1100   PROTEINUR NEGATIVE 09/27/2022 1100   UROBILINOGEN 0.2 05/19/2020 1618   UROBILINOGEN 2.0 (H) 10/21/2017 1102   NITRITE NEGATIVE 09/27/2022 1100   LEUKOCYTESUR TRACE (A) 09/27/2022 1100    Radiological Exams on Admission: CT Angio Chest PE W and/or Wo Contrast Result Date: 04/30/2023 CLINICAL DATA:  53 year old male with history of shortness of breath and hypoxia. Left-sided chest pain. History of gastric cancer. * Tracking Code: BO * EXAM: CT ANGIOGRAPHY CHEST WITH CONTRAST TECHNIQUE: Multidetector CT imaging of the chest was performed using the standard protocol during bolus administration of intravenous contrast. Multiplanar CT image reconstructions and MIPs were obtained to evaluate the vascular anatomy. RADIATION DOSE REDUCTION: This exam was performed according to the departmental dose-optimization program which includes automated exposure control, adjustment of the mA and/or kV according to patient size and/or use of iterative reconstruction technique. CONTRAST:  50mL  OMNIPAQUE IOHEXOL 350 MG/ML SOLN COMPARISON:  CT of the chest, abdomen and pelvis 04/10/2023. FINDINGS: Cardiovascular: Study is slightly limited by suboptimal contrast bolus. With these limitations in mind, there is no central, lobar or segmental sized filling defect in the pulmonary arterial tree to indicate clinically relevant pulmonary embolism. Smaller subsegmental sized pulmonary emboli can not be entirely excluded. Heart size is normal. Moderate to large pericardial effusion. No definite pericardial calcification. There is aortic atherosclerosis, as well as atherosclerosis of the great vessels of the mediastinum and the coronary arteries, including calcified atherosclerotic plaque in the left anterior descending coronary artery. Mild calcifications of the aortic valve and mitral annulus. Right internal jugular single-lumen porta cath with tip terminating at the superior cavoatrial junction. Mediastinum/Nodes: No pathologically enlarged mediastinal or hilar lymph nodes. Esophagus is unremarkable in appearance. No axillary lymphadenopathy. Lungs/Pleura: Large bilateral pleural effusions (left-greater-than-right), predominantly lying dependently. Associated with these is extensive passive atelectasis in the lungs bilaterally. No definite consolidative airspace disease. Mild diffuse ground-glass attenuation and mild interlobular septal thickening in the lungs suggesting a background of mild interstitial pulmonary edema. Several small scattered pulmonary nodules are noted measuring 5 mm or less in size, similar to several prior examinations dating back to 2023, favored to be benign. No larger more suspicious appearing pulmonary nodules or masses are noted. Upper Abdomen: Aortic atherosclerosis. Musculoskeletal: There are no aggressive appearing lytic or blastic lesions noted in the visualized portions of the skeleton. Review of the MIP images confirms the above findings. IMPRESSION: 1. No central, lobar or  segmental sized pulmonary embolism. 2. Moderate to large pericardial effusion. 3. Large bilateral pleural effusions (left-greater-than-right) with extensive passive atelectasis in the lungs bilaterally. 4. Mild interstitial pulmonary edema. 5. Aortic atherosclerosis, in addition to left anterior descending coronary artery disease. 6. There are calcifications of the aortic valve and mitral annulus. Echocardiographic correlation for evaluation of potential valvular dysfunction may  be warranted if clinically indicated. 7. Small pulmonary nodules measuring 5 mm or less in size, stable compared to prior examinations, favored to be benign (although continued attention on future follow-up imaging is recommended given the patient's history of gastric cancer). Aortic Atherosclerosis (ICD10-I70.0). Electronically Signed   By: Trudie Reed M.D.   On: 04/30/2023 07:24   DG Chest 2 View Result Date: 04/29/2023 CLINICAL DATA:  Chest pain EXAM: CHEST - 2 VIEW COMPARISON:  04/23/2023 FINDINGS: Persistent consolidation/atelectasis at the left lung base with probable adjacent effusion. Some increase in perihilar pulmonary vascular congestion suspected. Mild thickening of or fluid in the interlobar fissures as before. No pneumothorax. Stable right IJ port catheter. Heart size and mediastinal contours are within normal limits. Visualized bones unremarkable. IMPRESSION: 1. Persistent left basilar consolidation/atelectasis with probable adjacent effusion. 2. Increased perihilar pulmonary vascular congestion. Electronically Signed   By: Corlis Leak M.D.   On: 04/29/2023 17:24    EKG: Independently reviewed. A-fib  Assessment/Plan Principal Problem:   Pleural effusion Active Problems:   History of CVA (cerebrovascular accident)   Paroxysmal atrial fibrillation (HCC)   Chronic kidney disease, stage 3b (HCC)   Moderate obstructive sleep apnea   Other cirrhosis of liver (HCC)   HFrEF (heart failure with reduced ejection  fraction) (HCC)   Gastric cancer (HCC)   Chemotherapy-induced neuropathy (HCC)   PAF (paroxysmal atrial fibrillation) (HCC)   Diabetes mellitus (HCC)   Class 1 obesity with serious comorbidity in adult   Benign essential HTN   # Pleural effusion B/l, chronic but worsening. Probably cardiac mediated, does however have metastatic cancer. - diurese as below - diagnostic/therapeutic thora ordered, including cytology  # Pericardial effusion Moderate, previously mild, doesn't appear to be in tamponade - diurese as below - cardiology consulted  # Chest pain, tropinemia Mild trops stable in the 50s no overt ischemic changes on EKG, likely demand - defer further treatment/eval to cardiology  # Acute hypoxic respiratory failure 2/2 pleural effusions, chf. Cta no PE - diurese - thoracentesis  # HFmid-rangeEF TTE this month with ef 40-45, mod lvh, mildly reduced rv systolic function - hold home torsemide, farxiga, losart - lasix 80 iv once - cont home metop - EDP Dr. Derrill Kay will be consulting cardiology  # Stage 4 gastric adenocarcinoma Prognosis appears poor given underlying worsening comorbidities, poor functional status - will ask palliative oncology to see  # Hx CVA Multiple, with aphasia. Patient/sister report no new symptoms - cont home apixaban, statin  # CKD 3b Gfr of 32, baseline is in 40s - monitor closely while diuresing - hold losartan  # GAD - home buspar, sertraline  # Chronic pain - home oxy  # HTN Controlled - cont home metop - home losart on hhold  # Cirrhosis Appears relatively compensated. Trace ascites seen on CT earlier this month. No report of bleeding - monitor  # T2DM Well controlled, only on farxiga - daily fasting sugars for now  # Obesity Noted  # OSa Not on cpap   DVT prophylaxis: apixaban Code Status: full, confirmed w/ patient  Family Communication: sister updated at bedside, lives with her  Consults called: cardiology  (EDP Derrill Kay said he will call0   Level of care: Telemetry Cardiac Status is: Inpatient Remains inpatient appropriate because: severity of illness    Silvano Bilis MD [facility] Pager [phone]  If 7PM-7AM, please contact night-coverage [email or web address] Password [email or web address]  04/30/2023, 8:29 AM

## 2023-04-30 NOTE — ED Notes (Signed)
Pt oxygen saturation noted to be 86% with a perfect pleth, went in to talk with patient who denies o2 use at home but does endorse sleep apnea. Spoke with Dr. Derrill Kay after 2 L was applied [name]. Dr. Derrill Kay ordered covid/flu/rsv and CT. In to discuss with patient, wife voices concerns as patient was just discharged on Monday and wants to hold off on test at this time. Dr. Derrill Kay aware

## 2023-04-30 NOTE — Progress Notes (Signed)
To ultrasound for thoracentesis

## 2023-04-30 NOTE — Procedures (Signed)
PROCEDURE SUMMARY:  Successful image-guided left thoracentesis. Yielded 1.5 liters of clear yellow fluid. Patient tolerated procedure well. EBL: Zero No immediate complications.  Specimen was sent for labs. Post procedure CXR shows no pneumothorax.  Please see imaging section of Epic for full dictation.  Villa Herb PA-C 04/30/2023 11:02 AM

## 2023-05-01 ENCOUNTER — Inpatient Hospital Stay: Payer: Medicaid Other

## 2023-05-01 DIAGNOSIS — N1832 Chronic kidney disease, stage 3b: Secondary | ICD-10-CM

## 2023-05-01 DIAGNOSIS — J9620 Acute and chronic respiratory failure, unspecified whether with hypoxia or hypercapnia: Secondary | ICD-10-CM | POA: Diagnosis not present

## 2023-05-01 DIAGNOSIS — J90 Pleural effusion, not elsewhere classified: Secondary | ICD-10-CM | POA: Diagnosis not present

## 2023-05-01 DIAGNOSIS — I3139 Other pericardial effusion (noninflammatory): Secondary | ICD-10-CM | POA: Diagnosis not present

## 2023-05-01 DIAGNOSIS — R7989 Other specified abnormal findings of blood chemistry: Secondary | ICD-10-CM | POA: Diagnosis not present

## 2023-05-01 LAB — BASIC METABOLIC PANEL
Anion gap: 12 (ref 5–15)
BUN: 62 mg/dL — ABNORMAL HIGH (ref 6–20)
CO2: 20 mmol/L — ABNORMAL LOW (ref 22–32)
Calcium: 8.6 mg/dL — ABNORMAL LOW (ref 8.9–10.3)
Chloride: 103 mmol/L (ref 98–111)
Creatinine, Ser: 2.84 mg/dL — ABNORMAL HIGH (ref 0.61–1.24)
GFR, Estimated: 26 mL/min — ABNORMAL LOW (ref >60)
Glucose, Bld: 114 mg/dL — ABNORMAL HIGH (ref 70–99)
Potassium: 4.8 mmol/L (ref 3.5–5.1)
Sodium: 135 mmol/L (ref 135–145)

## 2023-05-01 LAB — BRAIN NATRIURETIC PEPTIDE: B Natriuretic Peptide: 33.6 pg/mL (ref 0.0–100.0)

## 2023-05-01 LAB — HEPATIC FUNCTION PANEL
ALT: 22 U/L (ref 0–44)
AST: 30 U/L (ref 15–41)
Albumin: 3 g/dL — ABNORMAL LOW (ref 3.5–5.0)
Alkaline Phosphatase: 143 U/L — ABNORMAL HIGH (ref 38–126)
Bilirubin, Direct: 0.2 mg/dL (ref 0.0–0.2)
Indirect Bilirubin: 0.7 mg/dL (ref 0.3–0.9)
Total Bilirubin: 0.9 mg/dL (ref 0.0–1.2)
Total Protein: 7.1 g/dL (ref 6.5–8.1)

## 2023-05-01 LAB — CBC
HCT: 35.3 % — ABNORMAL LOW (ref 39.0–52.0)
Hemoglobin: 11 g/dL — ABNORMAL LOW (ref 13.0–17.0)
MCH: 28.4 pg (ref 26.0–34.0)
MCHC: 31.2 g/dL (ref 30.0–36.0)
MCV: 91 fL (ref 80.0–100.0)
Platelets: 188 10*3/uL (ref 150–400)
RBC: 3.88 MIL/uL — ABNORMAL LOW (ref 4.22–5.81)
RDW: 17.8 % — ABNORMAL HIGH (ref 11.5–15.5)
WBC: 7.6 10*3/uL (ref 4.0–10.5)
nRBC: 0 % (ref 0.0–0.2)

## 2023-05-01 LAB — ECHOCARDIOGRAM LIMITED: S' Lateral: 4.1 cm

## 2023-05-01 LAB — PROTEIN, TOTAL: Total Protein: 7.3 g/dL (ref 6.5–8.1)

## 2023-05-01 LAB — LACTATE DEHYDROGENASE: LDH: 995 U/L — ABNORMAL HIGH (ref 98–192)

## 2023-05-01 LAB — PROTEIN, PLEURAL OR PERITONEAL FLUID: Total protein, fluid: 4.8 g/dL

## 2023-05-01 MED ORDER — ONDANSETRON 4 MG PO TBDP
4.0000 mg | ORAL_TABLET | Freq: Three times a day (TID) | ORAL | Status: DC | PRN
  Administered 2023-05-01 – 2023-05-03 (×3): 4 mg via ORAL
  Filled 2023-05-01 (×3): qty 1

## 2023-05-01 MED ORDER — SPIRONOLACTONE 25 MG PO TABS
50.0000 mg | ORAL_TABLET | Freq: Every day | ORAL | Status: DC
  Administered 2023-05-01 – 2023-05-02 (×2): 50 mg via ORAL
  Filled 2023-05-01 (×2): qty 2

## 2023-05-01 MED ORDER — FUROSEMIDE 10 MG/ML IJ SOLN
60.0000 mg | Freq: Every day | INTRAMUSCULAR | Status: DC
Start: 2023-05-01 — End: 2023-05-02
  Administered 2023-05-01 – 2023-05-02 (×2): 60 mg via INTRAVENOUS
  Filled 2023-05-01 (×2): qty 8

## 2023-05-01 MED ORDER — MIDODRINE HCL 5 MG PO TABS
10.0000 mg | ORAL_TABLET | Freq: Three times a day (TID) | ORAL | Status: DC
  Administered 2023-05-02 – 2023-05-03 (×5): 10 mg via ORAL
  Filled 2023-05-01 (×6): qty 2

## 2023-05-01 MED ORDER — ALBUMIN HUMAN 25 % IV SOLN
12.5000 g | Freq: Every day | INTRAVENOUS | Status: DC
  Administered 2023-05-01 – 2023-05-05 (×5): 12.5 g via INTRAVENOUS
  Filled 2023-05-01 (×6): qty 50

## 2023-05-01 NOTE — ED Notes (Signed)
Patient ambulated to the restroom with this RN at this time

## 2023-05-01 NOTE — ED Notes (Signed)
Patient ambulated to the restroom at this time with this RN.

## 2023-05-01 NOTE — ED Notes (Signed)
Patient ambulated to the restroom at this time.

## 2023-05-01 NOTE — Progress Notes (Addendum)
PROGRESS NOTE    KARMAN VENEY  NWG:[id number] DOB: 06-25-1970 DOA: 04/30/2023 PCP: Debera Lat, PA-C  Outpatient Specialists: oncology, cardiology    Brief Narrative:   Jeremiah Vance is a 53 y.o. male with medical history significant for obesity, recurrent cva, HFrecoveredEF, htn, ckd 3b, t2dm, stage 4 gastric adenocarcinoma, osa, cirrhosis, a fib, who presents with the above.   Discharged 2 days ago, was admitted for chf exacerbation, was diuresed.   Reports chest pain, non-exertional, yesterday afternoon that led him to contact EMS. Chest pain resolved. Also reports dyspnea. No fever, has mild non-productive cough. Reports compliance with home meds last couple of days, is making urine. Stable cancer-related pain, no vomiting or diarrhea.   Assessment & Plan:   Principal Problem:   Pleural effusion Active Problems:   History of CVA (cerebrovascular accident)   Paroxysmal atrial fibrillation (HCC)   Chronic kidney disease, stage 3b (HCC)   Moderate obstructive sleep apnea   Other cirrhosis of liver (HCC)   HFrEF (heart failure with reduced ejection fraction) (HCC)   Gastric cancer (HCC)   Chemotherapy-induced neuropathy (HCC)   PAF (paroxysmal atrial fibrillation) (HCC)   Diabetes mellitus (HCC)   Class 1 obesity with serious comorbidity in adult   Benign essential HTN   Status post thoracentesis   Palliative care encounter   Elevated troponin   Chest pain  # Pleural effusion B/l, chronic but worsening. Thought to be secondary to chf but fluid analysis consistent w/ exudate. 1.5 liters removed by IF on 1/29. - diuresis on pause - monitor pleural fluid cultures, ngtd, no organism on gram stain - f/u cytology - will ask pulmonary to review   # Pericardial effusion Moderate, previously mild, tte shows no tamponade - cardiology following   # Chest pain, tropinemia Mild trops stable in the 50s no overt ischemic changes on EKG, likely demand   # Acute hypoxic  respiratory failure 2/2 pleural effusions, chf. Cta no PE. Symptomatically improved after thoracentesis - continue [place] O2  # Nausea/vomiting Today, no abd pain, having BMs - zofran - check ct abdomen/pelvis, LFTs   # HFmid-rangeEF TTE this month with ef 40-45, mod lvh, mildly reduced rv systolic function - hold home torsemide, farxiga, losart - holding additional diuresis - cont home metop - cardiology following   # Stage 4 gastric adenocarcinoma Prognosis appears poor given underlying worsening comorbidities, poor functional status. Dr. Cathie Hoops and Mr. Windy Fast have seen. Chemo will need to be paused until other medical conditions stable.   # Hx CVA Multiple, with aphasia. Patient/sister report no new symptoms - cont home apixaban, statin   # AKI CKD 3b Gfr of 32 on, baseline is in 40s, today dip to 26 with IV lasix yesterday - hold diuresis - hold losartan   # GAD - home buspar, sertraline   # Chronic pain - home oxy   # HTN Controlled - cont home metop - home losart on hold   # Cirrhosis Appears relatively compensated. Trace ascites seen on CT earlier this month. No report of bleeding - monitor   # T2DM Well controlled, only on farxiga - daily fasting sugars for now   # Obesity Noted   # OSa Not on cpap     DVT prophylaxis: apixaban Code Status: DNR (code status changed 1/29 after lengthy discussion with oncology palliative care) Family Communication: sister updated at bedside 1/29, no answer when I called today. Aunt updated telephonically 1/30 Consults: cardiology    Level of care:  Telemetry Cardiac Status is: Inpatient Remains inpatient appropriate because: severity of illness    Consultants:  cardiology  Procedures: Thoracentesis 1/29  Antimicrobials:  none    Subjective: Repots dyspnea improved, but nauseaus today, one episode nbnb emesis, having regular bms no diarrhea, no abd pain  Objective: Vitals:   05/01/23 0700 05/01/23 0948  05/01/23 1200 05/01/23 1404  BP: 129/89 (!) 136/98 (!) 139/91   Pulse: 94 75 78   Resp: 20 (!) 29 (!) 23   Temp:  97.9 F (36.6 C)  97.6 F (36.4 C)  TempSrc:  Oral  Oral  SpO2: 95% 97% 97%   Weight:      Height:       No intake or output data in the 24 hours ending 05/01/23 1410 Filed Weights   04/29/23 1659  Weight: 106 kg    Examination:  General exam: Appears calm and chronically ill  Respiratory system: rales at bases Cardiovascular system: S1 & S2 heard, RRR.   Gastrointestinal system: Abdomen is obese mildly distended non tender Central nervous system: Alert and oriented. No focal neurological deficits. Extremities: Symmetric 5 x 5 power. Skin: No visible rashes, lesions or ulcers Psychiatry: Judgement and insight appear normal. Appears depressed.     Data Reviewed: I have personally reviewed following labs and imaging studies  CBC: Recent Labs  Lab 04/27/23 0432 04/29/23 1655 05/01/23 0326  WBC 4.6 5.9 7.6  HGB 11.3* 11.5* 11.0*  HCT 34.7* 35.9* 35.3*  MCV 86.8 90.2 91.0  PLT 197 182 188   Basic Metabolic Panel: Recent Labs  Lab 04/25/23 0521 04/26/23 0435 04/27/23 0432 04/29/23 1655 05/01/23 0326  NA 137 136 138 137 135  K 3.4* 4.0 3.7 4.4 4.8  CL 105 103 107 102 103  CO2 19* 22 20* 20* 20*  GLUCOSE 112* 130* 105* 156* 114*  BUN 43* 46* 46* 54* 62*  CREATININE 1.90* 2.22* 2.11* 2.36* 2.84*  CALCIUM 9.0 8.9 8.7* 8.6* 8.6*  MG 1.9  --   --   --   --    GFR: Estimated Creatinine Clearance: 37.3 mL/min (A) (by C-G formula based on SCr of 2.84 mg/dL (H)). Liver Function Tests: No results for input(s): "AST", "ALT", "ALKPHOS", "BILITOT", "PROT", "ALBUMIN" in the last 168 hours. No results for input(s): "LIPASE", "AMYLASE" in the last 168 hours. Recent Labs  Lab 04/24/23 2038  AMMONIA 16   Coagulation Profile: No results for input(s): "INR", "PROTIME" in the last 168 hours. Cardiac Enzymes: No results for input(s): "CKTOTAL", "CKMB",  "CKMBINDEX", "TROPONINI" in the last 168 hours. BNP (last 3 results) No results for input(s): "PROBNP" in the last 8760 hours. HbA1C: No results for input(s): "HGBA1C" in the last 72 hours. CBG: Recent Labs  Lab 04/27/23 1116 04/27/23 1634 04/27/23 1954 04/28/23 0738 04/28/23 1131  GLUCAP 139* 186* 141* 94 163*   Lipid Profile: No results for input(s): "CHOL", "HDL", "LDLCALC", "TRIG", "CHOLHDL", "LDLDIRECT" in the last 72 hours. Thyroid Function Tests: No results for input(s): "TSH", "T4TOTAL", "FREET4", "T3FREE", "THYROIDAB" in the last 72 hours. Anemia Panel: No results for input(s): "VITAMINB12", "FOLATE", "FERRITIN", "TIBC", "IRON", "RETICCTPCT" in the last 72 hours. Urine analysis:    Component Value Date/Time   COLORURINE STRAW (A) 09/27/2022 1100   APPEARANCEUR CLEAR (A) 09/27/2022 1100   LABSPEC 1.006 09/27/2022 1100   PHURINE 6.0 09/27/2022 1100   GLUCOSEU NEGATIVE 09/27/2022 1100   HGBUR NEGATIVE 09/27/2022 1100   BILIRUBINUR NEGATIVE 09/27/2022 1100   BILIRUBINUR negative 05/19/2020 1618  KETONESUR NEGATIVE 09/27/2022 1100   PROTEINUR NEGATIVE 09/27/2022 1100   UROBILINOGEN 0.2 05/19/2020 1618   UROBILINOGEN 2.0 (H) 10/21/2017 1102   NITRITE NEGATIVE 09/27/2022 1100   LEUKOCYTESUR TRACE (A) 09/27/2022 1100   Sepsis Labs: @LABRCNTIP (procalcitonin:4,lacticidven:4)  ) Recent Results (from the past 240 hours)  Resp panel by RT-PCR (RSV, Flu A&B, Covid) Anterior Nasal Swab     Status: None   Collection Time: 04/23/23  5:13 PM   Specimen: Anterior Nasal Swab  Result Value Ref Range Status   SARS Coronavirus 2 by RT PCR NEGATIVE NEGATIVE Final    Comment: (NOTE) SARS-CoV-2 target nucleic acids are NOT DETECTED.  The SARS-CoV-2 RNA is generally detectable in upper respiratory specimens during the acute phase of infection. The lowest concentration of SARS-CoV-2 viral copies this assay can detect is 138 copies/mL. A negative result does not preclude  SARS-Cov-2 infection and should not be used as the sole basis for treatment or other patient management decisions. A negative result may occur with  improper specimen collection/handling, submission of specimen other than nasopharyngeal swab, presence of viral mutation(s) within the areas targeted by this assay, and inadequate number of viral copies(<138 copies/mL). A negative result must be combined with clinical observations, patient history, and epidemiological information. The expected result is Negative.  Fact Sheet for Patients:  BloggerCourse.com  Fact Sheet for Healthcare Providers:  SeriousBroker.it  This test is no t yet approved or cleared by the [place] FDA and  has been authorized for detection and/or diagnosis of SARS-CoV-2 by FDA under an Emergency Use Authorization (EUA). This EUA will remain  in effect (meaning this test can be used) for the duration of the COVID-19 declaration under Section 564(b)(1) of the Act, 21 U.S.C.section 360bbb-3(b)(1), unless the authorization is terminated  or revoked sooner.       Influenza A by PCR NEGATIVE NEGATIVE Final   Influenza B by PCR NEGATIVE NEGATIVE Final    Comment: (NOTE) The Xpert Xpress SARS-CoV-2/FLU/RSV plus assay is intended as an aid in the diagnosis of influenza from Nasopharyngeal swab specimens and should not be used as a sole basis for treatment. Nasal washings and aspirates are unacceptable for Xpert Xpress SARS-CoV-2/FLU/RSV testing.  Fact Sheet for Patients: BloggerCourse.com  Fact Sheet for Healthcare Providers: SeriousBroker.it  This test is not yet approved or cleared by the [place] FDA and has been authorized for detection and/or diagnosis of SARS-CoV-2 by FDA under an Emergency Use Authorization (EUA). This EUA will remain in effect (meaning this test can be used) for the duration of  the COVID-19 declaration under Section 564(b)(1) of the Act, 21 U.S.C. section 360bbb-3(b)(1), unless the authorization is terminated or revoked.     Resp Syncytial Virus by PCR NEGATIVE NEGATIVE Final    Comment: (NOTE) Fact Sheet for Patients: BloggerCourse.com  Fact Sheet for Healthcare Providers: SeriousBroker.it  This test is not yet approved or cleared by the [place] FDA and has been authorized for detection and/or diagnosis of SARS-CoV-2 by FDA under an Emergency Use Authorization (EUA). This EUA will remain in effect (meaning this test can be used) for the duration of the COVID-19 declaration under Section 564(b)(1) of the Act, 21 U.S.C. section 360bbb-3(b)(1), unless the authorization is terminated or revoked.  Performed at [facility], [address] [place], [place] [address]   Culture, blood (routine x 2)     Status: None   Collection Time: 04/23/23  9:46 PM   Specimen: BLOOD  Result Value Ref Range Status  Specimen Description BLOOD RIGHT ARM  Final   Special Requests   Final    BOTTLES DRAWN AEROBIC AND ANAEROBIC Blood Culture results may not be optimal due to an inadequate volume of blood received in culture bottles   Culture   Final    NO GROWTH 5 DAYS Performed at [facility], [address] [place], [place] [address]    Report Status 04/28/2023 FINAL  Final  Culture, blood (routine x 2)     Status: None   Collection Time: 04/23/23  9:46 PM   Specimen: BLOOD  Result Value Ref Range Status   Specimen Description BLOOD LEFT FOREARM  Final   Special Requests   Final    BOTTLES DRAWN AEROBIC ONLY Blood Culture results may not be optimal due to an inadequate volume of blood received in culture bottles   Culture   Final    NO GROWTH 5 DAYS Performed at [facility], [address] [place], [place] [address]    Report Status 04/28/2023 FINAL  Final  Resp panel by  RT-PCR (RSV, Flu A&B, Covid) Anterior Nasal Swab     Status: None   Collection Time: 04/30/23  6:22 AM   Specimen: Anterior Nasal Swab  Result Value Ref Range Status   SARS Coronavirus 2 by RT PCR NEGATIVE NEGATIVE Final    Comment: (NOTE) SARS-CoV-2 target nucleic acids are NOT DETECTED.  The SARS-CoV-2 RNA is generally detectable in upper respiratory specimens during the acute phase of infection. The lowest concentration of SARS-CoV-2 viral copies this assay can detect is 138 copies/mL. A negative result does not preclude SARS-Cov-2 infection and should not be used as the sole basis for treatment or other patient management decisions. A negative result may occur with  improper specimen collection/handling, submission of specimen other than nasopharyngeal swab, presence of viral mutation(s) within the areas targeted by this assay, and inadequate number of viral copies(<138 copies/mL). A negative result must be combined with clinical observations, patient history, and epidemiological information. The expected result is Negative.  Fact Sheet for Patients:  BloggerCourse.com  Fact Sheet for Healthcare Providers:  SeriousBroker.it  This test is no t yet approved or cleared by the [place] FDA and  has been authorized for detection and/or diagnosis of SARS-CoV-2 by FDA under an Emergency Use Authorization (EUA). This EUA will remain  in effect (meaning this test can be used) for the duration of the COVID-19 declaration under Section 564(b)(1) of the Act, 21 U.S.C.section 360bbb-3(b)(1), unless the authorization is terminated  or revoked sooner.       Influenza A by PCR NEGATIVE NEGATIVE Final   Influenza B by PCR NEGATIVE NEGATIVE Final    Comment: (NOTE) The Xpert Xpress SARS-CoV-2/FLU/RSV plus assay is intended as an aid in the diagnosis of influenza from Nasopharyngeal swab specimens and should not be used as a sole basis  for treatment. Nasal washings and aspirates are unacceptable for Xpert Xpress SARS-CoV-2/FLU/RSV testing.  Fact Sheet for Patients: BloggerCourse.com  Fact Sheet for Healthcare Providers: SeriousBroker.it  This test is not yet approved or cleared by the [place] FDA and has been authorized for detection and/or diagnosis of SARS-CoV-2 by FDA under an Emergency Use Authorization (EUA). This EUA will remain in effect (meaning this test can be used) for the duration of the COVID-19 declaration under Section 564(b)(1) of the Act, 21 U.S.C. section 360bbb-3(b)(1), unless the authorization is terminated or revoked.     Resp Syncytial Virus by PCR NEGATIVE NEGATIVE Final  Comment: (NOTE) Fact Sheet for Patients: BloggerCourse.com  Fact Sheet for Healthcare Providers: SeriousBroker.it  This test is not yet approved or cleared by the [place] FDA and has been authorized for detection and/or diagnosis of SARS-CoV-2 by FDA under an Emergency Use Authorization (EUA). This EUA will remain in effect (meaning this test can be used) for the duration of the COVID-19 declaration under Section 564(b)(1) of the Act, 21 U.S.C. section 360bbb-3(b)(1), unless the authorization is terminated or revoked.  Performed at [facility], [address] [place], [place] [address]   Body fluid culture w Gram Stain     Status: None (Preliminary result)   Collection Time: 04/30/23 10:52 AM   Specimen: PATH Cytology Pleural fluid  Result Value Ref Range Status   Specimen Description   Final    PLEURAL Performed at [facility], [address] [place], [place] [address]    Special Requests   Final    PLEURAL Performed at [facility], [address] [place], [place] [address]    Gram Stain   Final    FEW WBC PRESENT, PREDOMINANTLY MONONUCLEAR NO ORGANISMS SEEN     Culture   Final    NO GROWTH < 24 HOURS Performed at [facility], [address], [place], [place] [address]    Report Status PENDING  Incomplete         Radiology Studies: ECHOCARDIOGRAM LIMITED Result Date: 05/01/2023    ECHOCARDIOGRAM LIMITED REPORT   Patient Name:   TREW SUNDE Date of Exam: 04/30/2023 Medical Rec #:  [id number]       Height:       71.0 in Accession #:    [id number]      Weight:       233.7 lb Date of Birth:  1970/07/28       BSA:          2.253 m Patient Age:    52 years        BP:           127/89 mmHg Patient Gender: M               HR:           95 bpm. Exam Location:  [facility] Procedure: 2D Echo and Limited Echo Indications:     I31.3 Pericardial Effusion  History:         Patient has prior history of Echocardiogram examinations, most                  recent 04/24/2023. Cardiomyopathy, Stroke, Arrythmias:Atrial                  Fibrillation; Risk Factors:Hypertension and Diabetes.  Sonographer:     [place] Small Rodgers-Jones RDCS Referring Phys:  [id number] CHRISTOPHER RONALD BERGE Diagnosing Phys: Julien Nordmann MD IMPRESSIONS  1. Left ventricular ejection fraction, by estimation, is 40 to 45%. The left ventricle has mildly decreased function. The left ventricle demonstrates global hypokinesis. There is moderate left ventricular hypertrophy. Left ventricular diastolic parameters are indeterminate.  2. Right ventricular systolic function is mildly reduced. The right ventricular size is normal. Tricuspid regurgitation signal is inadequate for assessing PA pressure.  3. Moderate pericardial effusion. There is no evidence of cardiac tamponade.  4. The mitral valve is normal in structure. No evidence of mitral valve regurgitation. No evidence of mitral stenosis.  5. The aortic valve is normal in structure. Aortic valve regurgitation is not visualized. No aortic  stenosis is present.  6. The inferior vena cava is dilated in size with >50% respiratory variability, suggesting right atrial  pressure of 8 mmHg. FINDINGS  Left Ventricle: Left ventricular ejection fraction, by estimation, is 40 to 45%. The left ventricle has mildly decreased function. The left ventricle demonstrates global hypokinesis. The left ventricular internal cavity size was normal in size. There is  moderate left ventricular hypertrophy. Left ventricular diastolic parameters are indeterminate. Right Ventricle: The right ventricular size is normal. No increase in right ventricular wall thickness. Right ventricular systolic function is mildly reduced. Tricuspid regurgitation signal is inadequate for assessing PA pressure. Left Atrium: Left atrial size was normal in size. Right Atrium: Right atrial size was normal in size. Pericardium: A moderately sized pericardial effusion is present. There is no evidence of cardiac tamponade. Mitral Valve: The mitral valve is normal in structure. No evidence of mitral valve stenosis. Tricuspid Valve: The tricuspid valve is normal in structure. Tricuspid valve regurgitation is not demonstrated. No evidence of tricuspid stenosis. Aortic Valve: The aortic valve is normal in structure. Aortic valve regurgitation is not visualized. No aortic stenosis is present. Pulmonic Valve: The pulmonic valve was normal in structure. Pulmonic valve regurgitation is not visualized. No evidence of pulmonic stenosis. Aorta: The aortic root is normal in size and structure. Venous: The inferior vena cava is dilated in size with greater than 50% respiratory variability, suggesting right atrial pressure of 8 mmHg. IAS/Shunts: No atrial level shunt detected by color flow Doppler. LEFT VENTRICLE PLAX 2D LVIDd:         4.90 cm LVIDs:         4.10 cm LV PW:         1.30 cm LV IVS:        1.20 cm  IVC IVC diam: 2.50 cm LEFT ATRIUM         Index LA diam:    5.20 cm 2.31 cm/m   AORTA Ao Root diam: 3.80 cm Julien Nordmann MD Electronically signed by Julien Nordmann MD Signature Date/Time: 05/01/2023/11:13:46 AM    Final    DG  Chest Port 1 View Result Date: 04/30/2023 CLINICAL DATA:  Status post thoracentesis EXAM: PORTABLE CHEST 1 VIEW COMPARISON:  CT chest dated April 30, 2023. Chest radiograph dated April 29, 2023. FINDINGS: Status post thoracentesis with moderate-to-large left pleural effusion, decreased in size compared to the prior exam. There is improved aeration of the left upper lung. Persistent left basilar atelectasis. Small to moderate-sized layering right pleural effusion with associated basilar atelectasis. No pneumothorax. Central pulmonary vascular congestion. Similar enlargement of the cardiac silhouette. Stable right chest Port-A-Cath in place. Visualized osseous structures are unchanged. IMPRESSION: 1. Status post thoracentesis with decreased size of a moderate-to-large left pleural effusion and improved aeration within the left upper lung. Left basilar atelectasis. 2. Small to moderate-sized layering right pleural effusion with associated basilar atelectasis. 3. Central pulmonary vascular congestion. Electronically Signed   By: Hart Robinsons M.D.   On: 04/30/2023 12:10   US THORACENTESIS ASP PLEURAL SPACE W/IMG GUIDE Result Date: 04/30/2023 INDICATION: Patient with history of CHF, CKD3, cirrhosis, stage 4 gastric adenocarcinoma admitted for chest pain and dyspnea. Imaging shows new bilateral pleural effusions. Request for diagnostic and therapeutic thoracentesis. EXAM: ULTRASOUND GUIDED LEFT THORACENTESIS MEDICATIONS: 7 mL 1% lidocaine COMPLICATIONS: None immediate. PROCEDURE: An ultrasound guided thoracentesis was thoroughly discussed with the patient and questions answered. The benefits, risks, alternatives and complications were also discussed. The patient understands and wishes to proceed with the  procedure. Written consent was obtained. Ultrasound was performed to localize and mark an adequate pocket of fluid in the left chest. The area was then prepped and draped in the normal sterile fashion. 1%  Lidocaine was used for local anesthesia. Under ultrasound guidance a 6 Fr Safe-T-Centesis catheter was introduced. Thoracentesis was performed. The catheter was removed and a dressing applied. FINDINGS: A total of approximately 1.5 L of clear yellow fluid was removed. Samples were sent to the laboratory as requested by the clinical team. IMPRESSION: Successful ultrasound guided left thoracentesis yielding 1.5 L of pleural fluid. Performed by Lynnette Caffey, PA-C Electronically Signed   By: Malachy Moan M.D.   On: 04/30/2023 12:04   CT Angio Chest PE W and/or Wo Contrast Result Date: 04/30/2023 CLINICAL DATA:  53 year old male with history of shortness of breath and hypoxia. Left-sided chest pain. History of gastric cancer. * Tracking Code: BO * EXAM: CT ANGIOGRAPHY CHEST WITH CONTRAST TECHNIQUE: Multidetector CT imaging of the chest was performed using the standard protocol during bolus administration of intravenous contrast. Multiplanar CT image reconstructions and MIPs were obtained to evaluate the vascular anatomy. RADIATION DOSE REDUCTION: This exam was performed according to the departmental dose-optimization program which includes automated exposure control, adjustment of the mA and/or kV according to patient size and/or use of iterative reconstruction technique. CONTRAST:  50mL OMNIPAQUE IOHEXOL 350 MG/ML SOLN COMPARISON:  CT of the chest, abdomen and pelvis 04/10/2023. FINDINGS: Cardiovascular: Study is slightly limited by suboptimal contrast bolus. With these limitations in mind, there is no central, lobar or segmental sized filling defect in the pulmonary arterial tree to indicate clinically relevant pulmonary embolism. Smaller subsegmental sized pulmonary emboli can not be entirely excluded. Heart size is normal. Moderate to large pericardial effusion. No definite pericardial calcification. There is aortic atherosclerosis, as well as atherosclerosis of the great vessels of the mediastinum and  the coronary arteries, including calcified atherosclerotic plaque in the left anterior descending coronary artery. Mild calcifications of the aortic valve and mitral annulus. Right internal jugular single-lumen porta cath with tip terminating at the superior cavoatrial junction. Mediastinum/Nodes: No pathologically enlarged mediastinal or hilar lymph nodes. Esophagus is unremarkable in appearance. No axillary lymphadenopathy. Lungs/Pleura: Large bilateral pleural effusions (left-greater-than-right), predominantly lying dependently. Associated with these is extensive passive atelectasis in the lungs bilaterally. No definite consolidative airspace disease. Mild diffuse ground-glass attenuation and mild interlobular septal thickening in the lungs suggesting a background of mild interstitial pulmonary edema. Several small scattered pulmonary nodules are noted measuring 5 mm or less in size, similar to several prior examinations dating back to 2023, favored to be benign. No larger more suspicious appearing pulmonary nodules or masses are noted. Upper Abdomen: Aortic atherosclerosis. Musculoskeletal: There are no aggressive appearing lytic or blastic lesions noted in the visualized portions of the skeleton. Review of the MIP images confirms the above findings. IMPRESSION: 1. No central, lobar or segmental sized pulmonary embolism. 2. Moderate to large pericardial effusion. 3. Large bilateral pleural effusions (left-greater-than-right) with extensive passive atelectasis in the lungs bilaterally. 4. Mild interstitial pulmonary edema. 5. Aortic atherosclerosis, in addition to left anterior descending coronary artery disease. 6. There are calcifications of the aortic valve and mitral annulus. Echocardiographic correlation for evaluation of potential valvular dysfunction may be warranted if clinically indicated. 7. Small pulmonary nodules measuring 5 mm or less in size, stable compared to prior examinations, favored to be  benign (although continued attention on future follow-up imaging is recommended given the patient's history of gastric cancer). Aortic  Atherosclerosis (ICD10-I70.0). Electronically Signed   By: Trudie Reed M.D.   On: 04/30/2023 07:24   DG Chest 2 View Result Date: 04/29/2023 CLINICAL DATA:  Chest pain EXAM: CHEST - 2 VIEW COMPARISON:  04/23/2023 FINDINGS: Persistent consolidation/atelectasis at the left lung base with probable adjacent effusion. Some increase in perihilar pulmonary vascular congestion suspected. Mild thickening of or fluid in the interlobar fissures as before. No pneumothorax. Stable right IJ port catheter. Heart size and mediastinal contours are within normal limits. Visualized bones unremarkable. IMPRESSION: 1. Persistent left basilar consolidation/atelectasis with probable adjacent effusion. 2. Increased perihilar pulmonary vascular congestion. Electronically Signed   By: Corlis Leak M.D.   On: 04/29/2023 17:24        Scheduled Meds:  apixaban  5 mg Oral BID   atorvastatin  40 mg Oral q1800   busPIRone  7.5 mg Oral BID   metoprolol tartrate  12.5 mg Oral BID   sertraline  25 mg Oral Daily   sertraline  50 mg Oral Daily   Continuous Infusions:   LOS: 1 day     Silvano Bilis, MD [facility]   If 7PM-7AM, please contact night-coverage [email or web address] Password [email or web address] 05/01/2023, 2:10 PM

## 2023-05-01 NOTE — ED Notes (Signed)
This RD assisted patient in ambulating to the bathroom at this time.

## 2023-05-01 NOTE — Consult Note (Signed)
PULMONOLOGY         Date: 05/01/2023,   MRN# [id number] Jeremiah Vance 1970-07-02     AdmissionWeight: 106 kg                 CurrentWeight: 106 kg   CHIEF COMPLAINT:   Recurrent pleural effusion and anasarca   HISTORY OF PRESENT ILLNESS   This is a 53 y.o. male with medical history significant of GE junction tage 4 gastric adenocarcinoma, osa, cirrhosis, on chemotherapy, HTN, HLD, DM, dCHF,  stroke with mild left sided weakness, depression with anxiety, A-fib on Eliquis, obesity, who presents with SOB and anasarca. He was dcd with similar issues few days ago. He is reporting chest discomfort. He is noted to have severe LE edema, gut edema and decreased breath sounds. He had CT chest with findings of b/l pleural effusions and pericardial effusions.  He had thoracentesis done. PCCM consultation for further evaluation and management.    PAST MEDICAL HISTORY   Past Medical History:  Diagnosis Date   Acute ischemic stroke (HCC) 09/06/2017   Acute renal failure superimposed on stage 3a chronic kidney disease (HCC) 07/08/2019   Acute right-sided weakness    Allergies    Anasarca    Anemia 07/10/2017   Arthritis    Cancer (HCC)    stomach and esophageal cancer stage 4   Chicken pox    CKD (chronic kidney disease) stage 3, GFR 30-59 ml/min (HCC) 06/2017   Depression    Elevated troponin 07/08/2019   Heart failure with mid-range ejection fraction (HCC)    a. 06/2017 Echo: EF 40-45%; b. 07/2019 Echo: EF 50-55%; c. 10/2021 Echo: EF 30-35%; d. 10/2022 Echo: EF 55-60%; e. 04/2023 Echo: EF 40-45%, glob HK, mod LVH, mildly reduced RV fxn, mod dil LA, mild MR, Ao root 41mm, asc Ao 42mm.   High cholesterol    History of cardiomyopathy    LVEF 40 to 45% in April 2019 - subsequently normalized   Hyperbilirubinemia 07/10/2017   Hypertension    Ischemic stroke (HCC)    Small left internal capsule infarct due to lacunar disease   Morbid obesity (HCC)    NICM (nonischemic  cardiomyopathy) (HCC)    a. 06/2017 Echo: EF 40-45%; b. 07/2019 Echo: EF 50-55%; c. 10/2021 Echo: EF 30-35%; d. 12/2021 MV: EF 60%, no ischemia/scar; e. 10/2022 Echo: EF 55-60%; f. 04/2023 Echo: EF 40-45%.   Normocytic anemia 12/16/2017   Permanent atrial fibrillation (HCC)    a. CHA2DS2VASc = 5-->eliquis.   Recurrent incisional hernia with incarceration s/p repair 10/22/2017 10/21/2017   Stroke (HCC) 08/2017   "right sided weakness since; getting stronger though" (10/22/2017)   Type 2 diabetes mellitus [facility])      SURGICAL HISTORY   Past Surgical History:  Procedure Laterality Date   ABDOMINAL HERNIA REPAIR  2008; 10/22/2017   "scope; OPEN REPAIR INCARCERATED VENTRAL HERNIA   ESOPHAGOGASTRODUODENOSCOPY (EGD) WITH PROPOFOL N/A 09/10/2022   Procedure: ESOPHAGOGASTRODUODENOSCOPY (EGD) WITH PROPOFOL;  Surgeon: Toney Reil, MD;  Location: [facility] ENDOSCOPY;  Service: Gastroenterology;  Laterality: N/A;   ESOPHAGOGASTRODUODENOSCOPY (EGD) WITH PROPOFOL N/A 09/28/2022   Procedure: ESOPHAGOGASTRODUODENOSCOPY (EGD) WITH PROPOFOL;  Surgeon: Jaynie Collins, DO;  Location: [facility] ENDOSCOPY;  Service: Gastroenterology;  Laterality: N/A;   HEMOSTASIS CONTROL  09/28/2022   Procedure: HEMOSTASIS CONTROL;  Surgeon: Jaynie Collins, DO;  Location: [facility] ENDOSCOPY;  Service: Gastroenterology;;   HERNIA REPAIR     KNEE ARTHROSCOPY Right 1989   PORTA CATH INSERTION  N/A 09/25/2022   Procedure: PORTA CATH INSERTION;  Surgeon: Annice Needy, MD;  Location: [facility] INVASIVE CV LAB;  Service: Cardiovascular;  Laterality: N/A;   VENTRAL HERNIA REPAIR N/A 10/22/2017   Procedure: OPEN REPAIR INCARCERATED VENTRAL HERNIA;  Surgeon: Glenna Fellows, MD;  Location: [facility] OR;  Service: General;  Laterality: N/A;     FAMILY HISTORY   Family History  Problem Relation Age of Onset   Diabetes Mother    Stroke Mother    Arthritis Mother    Depression Mother    Heart disease Mother    Hypertension Mother     Learning disabilities Mother    Mental illness Mother    Sleep apnea Mother    Diabetes Father    Heart disease Father    Arthritis Father    Hearing loss Father    Hyperlipidemia Father    Heart attack Father    Hypertension Father    Stroke Father    Diabetes Sister    Depression Sister    Diabetes Sister    Hypertension Sister    Mental illness Sister    Sleep apnea Sister    Diabetes Maternal Grandmother    Heart disease Maternal Grandmother    Depression Maternal Grandmother    Hyperlipidemia Maternal Grandmother    Hypertension Maternal Grandmother    Stroke Maternal Grandmother    Hypertension Maternal Grandfather    Hyperlipidemia Maternal Grandfather    Stroke Maternal Grandfather    Arthritis Paternal Grandmother    Hearing loss Paternal Grandmother    Stroke Paternal Grandfather    Heart disease Paternal Grandfather    Arthritis Paternal Grandfather    Heart attack Paternal Grandfather    Melanoma Other      SOCIAL HISTORY   Social History   Tobacco Use   Smoking status: Never   Smokeless tobacco: Never  Vaping Use   Vaping status: Never Used  Substance Use Topics   Alcohol use: Yes    Comment: occasional beer   Drug use: Never     MEDICATIONS    Home Medication:  Current Outpatient Rx   Order #: [id number] Class: OTC   Order #: [id number] Class: Normal   Order #: [id number] Class: Normal   Order #: [id number] Class: Normal   Order #: [id number] Class: No Print   Order #: [id number] Class: Normal   Order #: [id number] Class: Normal   Order #: [id number] Class: Normal   Order #: [id number] Class: Normal   Order #: [id number] Class: Normal   Order #: [id number] Class: Normal   Order #: [id number] Class: Normal   Order #: [id number] Class: Normal   Order #: [id number] Class: Normal   Order #: [id number] Class: Normal   Order #: [id number] Class: Normal   Order #: [id number] Class: Normal   Order #: [id number] Class: OTC   Order #: [id number] Class: Normal    Current  Medication:  Current Facility-Administered Medications:    apixaban (ELIQUIS) tablet 5 mg, 5 mg, Oral, BID, Nazari, Walid A, RPH, 5 mg at 05/01/23 0948   atorvastatin (LIPITOR) tablet 40 mg, 40 mg, Oral, q1800, Wouk, Wilfred Curtis, MD, 40 mg at 04/30/23 1725   busPIRone (BUSPAR) tablet 7.5 mg, 7.5 mg, Oral, BID, Wouk, Wilfred Curtis, MD, 7.5 mg at 05/01/23 0948   metoprolol tartrate (LOPRESSOR) tablet 12.5 mg, 12.5 mg, Oral, BID, Wouk, Wilfred Curtis, MD, 12.5 mg at 05/01/23 0948   ondansetron (ZOFRAN-ODT) disintegrating tablet 4 mg, 4 mg, Oral, Q8H PRN, Wouk, Wilfred Curtis, MD, 4 mg at 05/01/23 1244   oxyCODONE (Oxy IR/ROXICODONE) immediate release tablet  5 mg, 5 mg, Oral, Q6H PRN, Wouk, Wilfred Curtis, MD   sertraline (ZOLOFT) tablet 25 mg, 25 mg, Oral, Daily, Wouk, Wilfred Curtis, MD, 25 mg at 05/01/23 0948   sertraline (ZOLOFT) tablet 50 mg, 50 mg, Oral, Daily, Wouk, Wilfred Curtis, MD, 50 mg at 05/01/23 5284  Current Outpatient Medications:    acetaminophen (TYLENOL) 325 MG tablet, Take 2 tablets (650 mg total) by mouth every 4 (four) hours as needed for mild pain (or temp > 37.5 C (99.5 F))., Disp: , Rfl:    apixaban (ELIQUIS) 5 MG TABS tablet, Take 1 tablet (5 mg total) by mouth 2 (two) times daily., Disp: 60 tablet, Rfl: 1   busPIRone (BUSPAR) 7.5 MG tablet, Take 1 tablet (7.5 mg total) by mouth 2 (two) times daily., Disp: 180 tablet, Rfl: 1   dapagliflozin propanediol (FARXIGA) 10 MG TABS tablet, Take 1 tablet (10 mg total) by mouth daily., Disp: 30 tablet, Rfl: 0   diclofenac Sodium (VOLTAREN) 1 % GEL, Apply 4 g topically 4 (four) times daily., Disp: , Rfl:    losartan (COZAAR) 25 MG tablet, Take 1 tablet (25 mg total) by mouth daily., Disp: 30 tablet, Rfl: 1   metoprolol tartrate (LOPRESSOR) 25 MG tablet, Take 0.5 tablets (12.5 mg total) by mouth 2 (two) times daily., Disp: 60 tablet, Rfl: 1   ondansetron (ZOFRAN-ODT) 8 MG disintegrating tablet, Take 1 tablet (8 mg total) by mouth every 8 (eight)  hours as needed for nausea or vomiting., Disp: 60 tablet, Rfl: 1   pantoprazole (PROTONIX) 40 MG tablet, Take 1 tablet (40 mg total) by mouth 2 (two) times daily before a meal., Disp: 60 tablet, Rfl: 3   prochlorperazine (COMPAZINE) 10 MG tablet, Take 1 tablet (10 mg total) by mouth every 6 (six) hours as needed for nausea or vomiting., Disp: 60 tablet, Rfl: 0   sertraline (ZOLOFT) 25 MG tablet, Take 1 tablet (25 mg total) by mouth daily. (Patient taking differently: Take 25 mg by mouth daily. Take along with one 50 mg tablet for total 75 mg once daily), Disp: 30 tablet, Rfl: 3   sertraline (ZOLOFT) 50 MG tablet, Take 1 tablet (50 mg total) by mouth daily., Disp: 30 tablet, Rfl: 3   torsemide (DEMADEX) 10 MG tablet, Take 1 tablet (10 mg total) by mouth daily as needed (lower ext swelling)., Disp: 30 tablet, Rfl: 0   atorvastatin (LIPITOR) 40 MG tablet, Take 1 tablet (40 mg total) by mouth daily at 6 PM., Disp: 90 tablet, Rfl: 3   Continuous Glucose Sensor (FREESTYLE LIBRE 2 SENSOR) MISC, Place 1 sensor on the skin every 14 days. Use to check glucose continuously, Disp: 2 each, Rfl: 11   Continuous Glucose Sensor (FREESTYLE LIBRE 3 SENSOR) MISC, Place 1 sensor on the skin every 14 days. Use to check glucose continuously, Disp: 2 each, Rfl: 11   dexamethasone (DECADRON) 4 MG tablet, TAKE 2 TABLETS(8 MG) BY MOUTH DAILY. START THE DAY AFTER CHEMOTHERAPY FOR 2 DAYS. TAKE WITH FOOD, Disp: 30 tablet, Rfl: 1   feeding supplement (ENSURE ENLIVE / ENSURE PLUS) LIQD, Take 237 mLs by mouth 2 (two) times daily between meals., Disp: , Rfl:    oxyCODONE (OXY IR/ROXICODONE) 5 MG immediate release tablet, Take 1 tablet (5 mg total) by mouth every 6 (six) hours as needed for severe pain (pain score 7-10). (Patient not taking: Reported on 04/30/2023), Disp: 60 tablet, Rfl: 0    ALLERGIES   Pollen extract and Sulfa antibiotics  REVIEW OF SYSTEMS    Review of Systems:  Gen:  Denies  fever, sweats, chills  weigh loss  HEENT: Denies blurred vision, double vision, ear pain, eye pain, hearing loss, nose bleeds, sore throat Cardiac:  No dizziness, chest pain or heaviness, chest tightness,edema Resp:   reports dyspnea chronically  Gi: Denies swallowing difficulty, stomach pain, nausea or vomiting, diarrhea, constipation, bowel incontinence Gu:  Denies bladder incontinence, burning urine Ext:   Denies Joint pain, stiffness or swelling Skin: Denies  skin rash, easy bruising or bleeding or hives Endoc:  Denies polyuria, polydipsia , polyphagia or weight change Psych:   Denies depression, insomnia or hallucinations   Other:  All other systems negative   VS: BP (!) 139/91   Pulse 78   Temp 97.6 F (36.4 C) (Oral)   Resp (!) 23   Ht 5\' 11"  (1.803 m)   Wt 106 kg   SpO2 97%   BMI 32.59 kg/m      PHYSICAL EXAM    GENERAL:NAD, no fevers, chills, no weakness no fatigue HEAD: Normocephalic, atraumatic.  EYES: Pupils equal, round, reactive to light. Extraocular muscles intact. No scleral icterus.  MOUTH: Moist mucosal membrane. Dentition intact. No abscess noted.  EAR, NOSE, THROAT: Clear without exudates. No external lesions.  NECK: Supple. No thyromegaly. No nodules. No JVD.  PULMONARY: decreased breath sounds with mild rhonchi worse at bases bilaterally.  CARDIOVASCULAR: S1 and S2. Regular rate and rhythm. No murmurs, rubs, or gallops. No edema. Pedal pulses 2+ bilaterally.  GASTROINTESTINAL: Soft, nontender, nondistended. No masses. Positive bowel sounds. No hepatosplenomegaly.  MUSCULOSKELETAL: No swelling, clubbing, or edema. Range of motion full in all extremities.  NEUROLOGIC: Cranial nerves II through XII are intact. No gross focal neurological deficits. Sensation intact. Reflexes intact.  SKIN: No ulceration, lesions, rashes, or cyanosis. Skin warm and dry. Turgor intact.  PSYCHIATRIC: Mood, affect within normal limits. The patient is awake, alert and oriented x 3. Insight, judgment  intact.       IMAGING   arrative & Impression  CLINICAL DATA:  History of gastric adenocarcinoma with nausea and vomiting. * Tracking Code: BO *   EXAM: CT ABDOMEN AND PELVIS WITHOUT CONTRAST   TECHNIQUE: Multidetector CT imaging of the abdomen and pelvis was performed following the standard protocol without IV contrast.   RADIATION DOSE REDUCTION: This exam was performed according to the departmental dose-optimization program which includes automated exposure control, adjustment of the mA and/or kV according to patient size and/or use of iterative reconstruction technique.   COMPARISON:  CT abdomen and pelvis dated 04/10/2023, CTA chest dated 04/30/2023, nuclear medicine PET dated 12/31/2022   FINDINGS: Lower chest: Partially imaged central venous catheter terminates at the superior cavoatrial junction. Re-expansion of partially imaged lingula and left lower lobe status post thoracentesis. Diffuse patchy ground-glass opacities of the lingula and partially imaged left lower lobe. Similar large right and decreased moderate left pleural effusions. No pneumothorax. Similar pericardial effusion. Coronary artery calcifications.   Hepatobiliary: No focal hepatic lesions. No intra or extrahepatic biliary ductal dilation. Contracted gallbladder demonstrates cholelithiasis. Adrenals   Pancreas: No focal lesions or main ductal dilation.   Spleen: Normal in size without focal abnormality.   Adrenals/Urinary Tract: Unchanged 1.3 cm left adrenal nodule measures 13 HU, not previously FDG-avid, likely adenoma. No specific follow-up imaging recommended. No right adrenal nodule. Mild left hydronephrosis contains excreted renal contrast material from recent CTA chest. Contrast is seen to the level of the proximal ureter, which demonstrates mild circumferential  mural thickening with adjacent soft tissue irregularity (2:52), inseparable from the anterior psoas. Excreted contrast  material within the urinary bladder.   Stomach/Bowel: Decreased conspicuity of mural thickening involving the gastric cardia. No evidence of bowel wall thickening, distention, or inflammatory changes. Normal appendix, which is contained within the hernia.   Vascular/Lymphatic: Aortic atherosclerosis. Partially imaged paraesophageal lymph nodes measuring up to 9 mm (2:2) are unchanged. Continued decrease in size of perigastric node measuring 2.2 x 2.0 cm (2:34), previously 3.7 x 2.1 cm. Interval increased multi station periportal, retroaortic, and pelvic lymphadenopathy, for example   -14 mm portacaval (2:33), previously 13 mm   -10 mm left para-aortic (2:39), previously 5 mm   -14 mm right external iliac (2:74), previously 12 mm   Reproductive: Prostate is unremarkable.   Other: Trace ascites.  No free air or fluid collection.   Musculoskeletal: No acute or abnormal lytic or blastic osseous lesions. Multilevel degenerative changes of the partially imaged thoracic and lumbar spine. Large ventral midline lower abdominal wall hernia containing nonobstructed loops of small and large bowel, as before.   IMPRESSION: 1. Mild left hydronephrosis contains excreted renal contrast material from recent CTA chest, suggestive of delayed nephrogram. Contrast is seen to the level of the proximal ureter, which demonstrates mild circumferential mural thickening with adjacent soft tissue irregularity, inseparable from the anterior psoas. Findings are suspicious for ureteral involvement by metastatic disease, less likely ascending urinary tract infection. Recommend correlation with urinalysis. 2. Interval increased multi station periportal, retroaortic, and pelvic lymphadenopathy, indeterminate, however suspicious for metastatic disease. 3. Decreased conspicuity of mural thickening involving the gastric cardia. Decreased size of perigastric nodule. 4. Re-expansion of partially imaged lingula  and left lower lobe status post thoracentesis. Diffuse patchy ground-glass opacities of the lingula and partially imaged left lower lobe, which may represent re-expansion pulmonary edema. 5. Similar large right and decreased moderate left pleural effusions. 6. Similar pericardial effusion. 7. Aortic Atherosclerosis (ICD10-I70.0). Coronary artery calcifications. Assessment for potential risk factor modification, dietary therapy or pharmacologic therapy may be warranted, if clinically indicated.     Electronically Signed   By: Agustin Cree M.D.   On: 05/01/2023 15:51    ASSESSMENT/PLAN   Bilateral pleural effusions  -Patient has multiple possible etiologies of fluid retention, including CKD, liver dysfunction and cancer  - Pleural fluid profile monocyte predominant exudate indicative of chronic effusion.  He had clear yellow fluid 1.5L drawn from left pleural space. Generally malignant effusion is neutrophil predominant bloody exudate. The fluid here may be due to renal impairment with concomitant liver cirrosis. There is already cytology in process to rule out malignant effusion  - at this time I recommend diuresis with dual diuretic to reduce electorlyte imbalance  -starting aldactone 50 and lasix 60 daily with midodrine and albumin   - will continue to follow and monitor as more data is available             Thank you for allowing me to participate in the care of this patient.   Patient/Family are satisfied with care plan and all questions have been answered.    Provider disclosure: Patient with at least one acute or chronic illness or injury that poses a threat to life or bodily function and is being managed actively during this encounter.  All of the below services have been performed independently by signing provider:  review of prior documentation from internal and or external health records.  Review of previous and current lab results.  Interview and  comprehensive  assessment during patient visit today. Review of current and previous chest radiographs/CT scans. Discussion of management and test interpretation with health care team and patient/family.   This document was prepared using Dragon voice recognition software and may include unintentional dictation errors.     Vida Rigger, M.D.  Division of Pulmonary & Critical Care Medicine

## 2023-05-01 NOTE — Progress Notes (Signed)
Rounding Note    Patient Name: Jeremiah Vance Date of Encounter: 05/01/2023  [facility] Cardiologist: Debbe Odea, MD   Subjective   Patient feels about the same. Feels weak and breathing is still a little short. Scr/BUN up today after lasix. BNP wnl.   Inpatient Medications    Scheduled Meds:  apixaban  5 mg Oral BID   atorvastatin  40 mg Oral q1800   busPIRone  7.5 mg Oral BID   metoprolol tartrate  12.5 mg Oral BID   sertraline  25 mg Oral Daily   sertraline  50 mg Oral Daily   Continuous Infusions:  PRN Meds: oxyCODONE   Vital Signs    Vitals:   05/01/23 0300 05/01/23 0400 05/01/23 0600 05/01/23 0700  BP: 115/68 119/84 116/80 129/89  Pulse: 91 82 92 94  Resp:  (!) 22  20  Temp:  98.1 F (36.7 C)    TempSrc:  Oral    SpO2: 93% 94% 95% 95%  Weight:      Height:       No intake or output data in the 24 hours ending 05/01/23 0741    04/29/2023    4:59 PM 04/28/2023    2:18 AM 04/27/2023    5:00 AM  Last 3 Weights  Weight (lbs) 233 lb 11 oz 233 lb 11 oz 230 lb 6.1 oz  Weight (kg) 106 kg 106 kg 104.5 kg      Telemetry    Afib 80s - Personally Reviewed  ECG    No new - Personally Reviewed  Physical Exam   GEN: No acute distress.   Neck: No JVD Cardiac: RRR, no murmurs, rubs, or gallops.  Respiratory:diminished on the left. GI: Soft, nontender, non-distended  MS: No edema; No deformity. Neuro:  Nonfocal  Psych: Normal affect   Labs    High Sensitivity Troponin:   Recent Labs  Lab 04/29/23 1655 04/29/23 2254  TROPONINIHS 55* 53*     Chemistry Recent Labs  Lab 04/25/23 0521 04/26/23 0435 04/27/23 0432 04/29/23 1655 05/01/23 0326  NA 137   < > 138 137 135  K 3.4*   < > 3.7 4.4 4.8  CL 105   < > 107 102 103  CO2 19*   < > 20* 20* 20*  GLUCOSE 112*   < > 105* 156* 114*  BUN 43*   < > 46* 54* 62*  CREATININE 1.90*   < > 2.11* 2.36* 2.84*  CALCIUM 9.0   < > 8.7* 8.6* 8.6*  MG 1.9  --   --   --   --   GFRNONAA 42*    < > 37* 32* 26*  ANIONGAP 13   < > 11 15 12    < > = values in this interval not displayed.    Lipids No results for input(s): "CHOL", "TRIG", "HDL", "LABVLDL", "LDLCALC", "CHOLHDL" in the last 168 hours.  Hematology Recent Labs  Lab 04/27/23 0432 04/29/23 1655 05/01/23 0326  WBC 4.6 5.9 7.6  RBC 4.00* 3.98* 3.88*  HGB 11.3* 11.5* 11.0*  HCT 34.7* 35.9* 35.3*  MCV 86.8 90.2 91.0  MCH 28.3 28.9 28.4  MCHC 32.6 32.0 31.2  RDW 17.2* 17.9* 17.8*  PLT 197 182 188   Thyroid No results for input(s): "TSH", "FREET4" in the last 168 hours.  BNP Recent Labs  Lab 05/01/23 0326  BNP 33.6    DDimer No results for input(s): "DDIMER" in the last 168 hours.  Radiology    DG Chest Port 1 View Result Date: 04/30/2023 CLINICAL DATA:  Status post thoracentesis EXAM: PORTABLE CHEST 1 VIEW COMPARISON:  CT chest dated April 30, 2023. Chest radiograph dated April 29, 2023. FINDINGS: Status post thoracentesis with moderate-to-large left pleural effusion, decreased in size compared to the prior exam. There is improved aeration of the left upper lung. Persistent left basilar atelectasis. Small to moderate-sized layering right pleural effusion with associated basilar atelectasis. No pneumothorax. Central pulmonary vascular congestion. Similar enlargement of the cardiac silhouette. Stable right chest Port-A-Cath in place. Visualized osseous structures are unchanged. IMPRESSION: 1. Status post thoracentesis with decreased size of a moderate-to-large left pleural effusion and improved aeration within the left upper lung. Left basilar atelectasis. 2. Small to moderate-sized layering right pleural effusion with associated basilar atelectasis. 3. Central pulmonary vascular congestion. Electronically Signed   By: Hart Robinsons M.D.   On: 04/30/2023 12:10   US THORACENTESIS ASP PLEURAL SPACE W/IMG GUIDE Result Date: 04/30/2023 INDICATION: Patient with history of CHF, CKD3, cirrhosis, stage 4 gastric  adenocarcinoma admitted for chest pain and dyspnea. Imaging shows new bilateral pleural effusions. Request for diagnostic and therapeutic thoracentesis. EXAM: ULTRASOUND GUIDED LEFT THORACENTESIS MEDICATIONS: 7 mL 1% lidocaine COMPLICATIONS: None immediate. PROCEDURE: An ultrasound guided thoracentesis was thoroughly discussed with the patient and questions answered. The benefits, risks, alternatives and complications were also discussed. The patient understands and wishes to proceed with the procedure. Written consent was obtained. Ultrasound was performed to localize and mark an adequate pocket of fluid in the left chest. The area was then prepped and draped in the normal sterile fashion. 1% Lidocaine was used for local anesthesia. Under ultrasound guidance a 6 Fr Safe-T-Centesis catheter was introduced. Thoracentesis was performed. The catheter was removed and a dressing applied. FINDINGS: A total of approximately 1.5 L of clear yellow fluid was removed. Samples were sent to the laboratory as requested by the clinical team. IMPRESSION: Successful ultrasound guided left thoracentesis yielding 1.5 L of pleural fluid. Performed by Lynnette Caffey, PA-C Electronically Signed   By: Malachy Moan M.D.   On: 04/30/2023 12:04   CT Angio Chest PE W and/or Wo Contrast Result Date: 04/30/2023 CLINICAL DATA:  53 year old male with history of shortness of breath and hypoxia. Left-sided chest pain. History of gastric cancer. * Tracking Code: BO * EXAM: CT ANGIOGRAPHY CHEST WITH CONTRAST TECHNIQUE: Multidetector CT imaging of the chest was performed using the standard protocol during bolus administration of intravenous contrast. Multiplanar CT image reconstructions and MIPs were obtained to evaluate the vascular anatomy. RADIATION DOSE REDUCTION: This exam was performed according to the departmental dose-optimization program which includes automated exposure control, adjustment of the mA and/or kV according to patient  size and/or use of iterative reconstruction technique. CONTRAST:  50mL OMNIPAQUE IOHEXOL 350 MG/ML SOLN COMPARISON:  CT of the chest, abdomen and pelvis 04/10/2023. FINDINGS: Cardiovascular: Study is slightly limited by suboptimal contrast bolus. With these limitations in mind, there is no central, lobar or segmental sized filling defect in the pulmonary arterial tree to indicate clinically relevant pulmonary embolism. Smaller subsegmental sized pulmonary emboli can not be entirely excluded. Heart size is normal. Moderate to large pericardial effusion. No definite pericardial calcification. There is aortic atherosclerosis, as well as atherosclerosis of the great vessels of the mediastinum and the coronary arteries, including calcified atherosclerotic plaque in the left anterior descending coronary artery. Mild calcifications of the aortic valve and mitral annulus. Right internal jugular single-lumen porta cath with tip terminating at the  superior cavoatrial junction. Mediastinum/Nodes: No pathologically enlarged mediastinal or hilar lymph nodes. Esophagus is unremarkable in appearance. No axillary lymphadenopathy. Lungs/Pleura: Large bilateral pleural effusions (left-greater-than-right), predominantly lying dependently. Associated with these is extensive passive atelectasis in the lungs bilaterally. No definite consolidative airspace disease. Mild diffuse ground-glass attenuation and mild interlobular septal thickening in the lungs suggesting a background of mild interstitial pulmonary edema. Several small scattered pulmonary nodules are noted measuring 5 mm or less in size, similar to several prior examinations dating back to 2023, favored to be benign. No larger more suspicious appearing pulmonary nodules or masses are noted. Upper Abdomen: Aortic atherosclerosis. Musculoskeletal: There are no aggressive appearing lytic or blastic lesions noted in the visualized portions of the skeleton. Review of the MIP images  confirms the above findings. IMPRESSION: 1. No central, lobar or segmental sized pulmonary embolism. 2. Moderate to large pericardial effusion. 3. Large bilateral pleural effusions (left-greater-than-right) with extensive passive atelectasis in the lungs bilaterally. 4. Mild interstitial pulmonary edema. 5. Aortic atherosclerosis, in addition to left anterior descending coronary artery disease. 6. There are calcifications of the aortic valve and mitral annulus. Echocardiographic correlation for evaluation of potential valvular dysfunction may be warranted if clinically indicated. 7. Small pulmonary nodules measuring 5 mm or less in size, stable compared to prior examinations, favored to be benign (although continued attention on future follow-up imaging is recommended given the patient's history of gastric cancer). Aortic Atherosclerosis (ICD10-I70.0). Electronically Signed   By: Trudie Reed M.D.   On: 04/30/2023 07:24   DG Chest 2 View Result Date: 04/29/2023 CLINICAL DATA:  Chest pain EXAM: CHEST - 2 VIEW COMPARISON:  04/23/2023 FINDINGS: Persistent consolidation/atelectasis at the left lung base with probable adjacent effusion. Some increase in perihilar pulmonary vascular congestion suspected. Mild thickening of or fluid in the interlobar fissures as before. No pneumothorax. Stable right IJ port catheter. Heart size and mediastinal contours are within normal limits. Visualized bones unremarkable. IMPRESSION: 1. Persistent left basilar consolidation/atelectasis with probable adjacent effusion. 2. Increased perihilar pulmonary vascular congestion. Electronically Signed   By: Corlis Leak M.D.   On: 04/29/2023 17:24    Cardiac Studies   Echo 04/2023 1. Left ventricular ejection fraction, by estimation, is 40 to 45%. The  left ventricle has mildly decreased function. The left ventricle  demonstrates global hypokinesis. There is moderate left ventricular  hypertrophy. Left ventricular diastolic   parameters are indeterminate.   2. Right ventricular systolic function is mildly reduced. The right  ventricular size is normal.   3. Left atrial size was moderately dilated.   4. A small pericardial effusion is present.   5. The mitral valve is normal in structure. Mild mitral valve  regurgitation. No evidence of mitral stenosis.   6. The aortic valve is normal in structure. Aortic valve regurgitation is  not visualized. No aortic stenosis is present.   7. There is mild dilatation of the aortic root, measuring 41 mm. There is  mild dilatation of the ascending aorta, measuring 42 mm.   8. The inferior vena cava is normal in size with greater than 50%  respiratory variability, suggesting right atrial pressure of 3 mmHg.    Echo 10/2022 1. Left ventricular ejection fraction, by estimation, is 55 to 60%. The  left ventricle has normal function. The left ventricle has no regional  wall motion abnormalities. There is mild left ventricular hypertrophy.  Left ventricular diastolic parameters  are indeterminate.   2. Right ventricular systolic function is low normal. The right  ventricular size is mildly enlarged. There is normal pulmonary artery  systolic pressure.   3. Left atrial size was mildly dilated.   4. The mitral valve is normal in structure. Mild mitral valve  regurgitation.   5. The aortic valve is tricuspid. Aortic valve regurgitation is not  visualized.   6. Aortic dilatation noted. There is borderline dilatation of the aortic  root, measuring 38 mm.   7. The inferior vena cava is dilated in size with <50% respiratory  variability, suggesting right atrial pressure of 15 mmHg.    Patient Profile     53 y.o. 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  Assessment & Plan   Acute on chronic respiratory failure Pleural effusion - s/p thoracentesis -1.5 L on 1/29 -Suspected pleural effusion multifactorial from metastatic cancer, A-fib, diastolic heart failure, renal failure, cirrhosis -Patient was given 1 dose of IV Lasix in the ER -Patient is requiring 2 L O2 -Repeat chest x-ray today  Pericardial effusion - echo 1/23 showed small pericardial effusion -CT scan was concerning for moderate to severe pericardial effusion -Repeat limited echo pending -No overt symptoms of tamponade  Elevated troponin -The setting of respiratory distress and other acute issues -Suspect supply demand mismatch -No aspirin given Eliquis - no plan for ischemic work-up at this time  Nonischemic cardiomyopathy -echo 1/23  EF of 40 to 45% with moderate LVH - Echo 10/2022 LVEF 55-60%, no WMA mild LVH -Given IV Lasix as above -Appears euvolemic -Continue Farxiga, losartan, metoprolol. Farxiga and losartan held on admission - PTA torsemide 10mg  PRN swelling  Permanent Afib - in rate controlled Afib - continue Eliquis for stroke ppx - continue BB for rate control  For questions or updates, please contact [facility] Please consult [email or web address] for contact info under        Signed, Tongela Encinas David Stall, PA-C  05/01/2023, 7:41 AM

## 2023-05-02 ENCOUNTER — Inpatient Hospital Stay: Payer: Medicaid Other | Admitting: Physician Assistant

## 2023-05-02 DIAGNOSIS — N179 Acute kidney failure, unspecified: Secondary | ICD-10-CM | POA: Diagnosis not present

## 2023-05-02 DIAGNOSIS — Z9889 Other specified postprocedural states: Secondary | ICD-10-CM | POA: Diagnosis not present

## 2023-05-02 DIAGNOSIS — I5023 Acute on chronic systolic (congestive) heart failure: Secondary | ICD-10-CM

## 2023-05-02 DIAGNOSIS — J90 Pleural effusion, not elsewhere classified: Secondary | ICD-10-CM | POA: Diagnosis not present

## 2023-05-02 DIAGNOSIS — C168 Malignant neoplasm of overlapping sites of stomach: Secondary | ICD-10-CM

## 2023-05-02 LAB — BASIC METABOLIC PANEL
Anion gap: 12 (ref 5–15)
BUN: 78 mg/dL — ABNORMAL HIGH (ref 6–20)
CO2: 19 mmol/L — ABNORMAL LOW (ref 22–32)
Calcium: 8.6 mg/dL — ABNORMAL LOW (ref 8.9–10.3)
Chloride: 101 mmol/L (ref 98–111)
Creatinine, Ser: 3.52 mg/dL — ABNORMAL HIGH (ref 0.61–1.24)
GFR, Estimated: 20 mL/min — ABNORMAL LOW (ref >60)
Glucose, Bld: 165 mg/dL — ABNORMAL HIGH (ref 70–99)
Potassium: 4.5 mmol/L (ref 3.5–5.1)
Sodium: 132 mmol/L — ABNORMAL LOW (ref 135–145)

## 2023-05-02 LAB — CYTOLOGY - NON PAP

## 2023-05-02 LAB — CBC
HCT: 33.2 % — ABNORMAL LOW (ref 39.0–52.0)
Hemoglobin: 10.6 g/dL — ABNORMAL LOW (ref 13.0–17.0)
MCH: 28.5 pg (ref 26.0–34.0)
MCHC: 31.9 g/dL (ref 30.0–36.0)
MCV: 89.2 fL (ref 80.0–100.0)
Platelets: 228 10*3/uL (ref 150–400)
RBC: 3.72 MIL/uL — ABNORMAL LOW (ref 4.22–5.81)
RDW: 17.8 % — ABNORMAL HIGH (ref 11.5–15.5)
WBC: 7 10*3/uL (ref 4.0–10.5)
nRBC: 0 % (ref 0.0–0.2)

## 2023-05-02 MED ORDER — SERTRALINE HCL 50 MG PO TABS
75.0000 mg | ORAL_TABLET | Freq: Every day | ORAL | Status: DC
  Administered 2023-05-03 – 2023-05-05 (×3): 75 mg via ORAL
  Filled 2023-05-02 (×3): qty 2

## 2023-05-02 NOTE — Evaluation (Signed)
Physical Therapy Evaluation Patient Details Name: Jeremiah Vance MRN: [id number] DOB: [birth year] Today's Date: 05/02/2023  History of Present Illness  presented to ER secondary to SOB; admitted for management of acute hypoxic respiratory failure secondary to pleural effusion, CHF.  S/p thoracentesis (04/30/23) with removal of 1.5L fluid  Clinical Impression  Patient resting in bed upon arrival to room; alert and oriented, follows commands and agreeable to participation with session.  Endorses chronic back and bilat knee pain, 6/10; meds received during session.  Bilat UE/LE strength and ROM grossly symmetrical and WFL for basic transfers and gait; no significant focal weakness appreciated.  Does display moderate muscle wasting between thumb and first finger of L hand (patient relates to previous strokes).  Moderate edema noted throughout bilat LEs. Currently requiring min assist for bed mobility; cga/min assist for sit/stand, standing balance and gait (120') with RW. Demonstrates reciprocal stepping pattern with fair step height/length; slow, but steady cadence. Min verbal cuing for visual/pathway orientation. Good walker position/management. Does require 2-3 standing rest periods throughout distance due to fatigue. Minimal SOB with distance/activity; sats >92% on RA throughout distance  Would benefit from skilled PT to address above deficits and promote optimal return to PLOF.; recommend post-acute PT follow up as indicated by interdisciplinary care team.            If plan is discharge home, recommend the following: A little help with walking and/or transfers;A little help with bathing/dressing/bathroom;Assist for transportation;Help with stairs or ramp for entrance;Assistance with cooking/housework   Can travel by private vehicle        Equipment Recommendations None recommended by PT  Recommendations for Other Services       Functional Status Assessment Patient has had a recent decline  in their functional status and demonstrates the ability to make significant improvements in function in a reasonable and predictable amount of time.     Precautions / Restrictions Precautions Precautions: Fall Precaution Comments: history of orthostasis Restrictions Weight Bearing Restrictions Per Provider Order: No      Mobility  Bed Mobility Overal bed mobility: Needs Assistance Bed Mobility: Supine to Sit, Sit to Supine     Supine to sit: Min assist Sit to supine: Supervision        Transfers Overall transfer level: Needs assistance Equipment used: Rolling walker (2 wheels) Transfers: Sit to/from Stand Sit to Stand: Contact guard assist           General transfer comment: denies dizziness; does rely on UE support on RW (partially due to baseline visual deficits)    Ambulation/Gait Ambulation/Gait assistance: Contact guard assist Gait Distance (Feet): 120 Feet Assistive device: Rolling walker (2 wheels)         General Gait Details: reciprocal stepping pattern with fair step height/length; slow, but steady cadence. Min verbal cuing for visual/pathway orientation.  Good walker position/management. Does require 2-3 standing rest periods throughout distance due to fatigue.  Minimal SOB with distance/activity; sats >92% on RA throughout distance  Stairs            Wheelchair Mobility     Tilt Bed    Modified Rankin (Stroke Patients Only)       Balance Overall balance assessment: Needs assistance Sitting-balance support: No upper extremity supported, Feet supported Sitting balance-Leahy Scale: Good     Standing balance support: Bilateral upper extremity supported Standing balance-Leahy Scale: Fair  Pertinent Vitals/Pain Pain Assessment Pain Assessment: Faces Faces Pain Scale: Hurts little more Pain Location: bilateral knees Pain Descriptors / Indicators: Aching, Grimacing Pain Intervention(s):  Limited activity within patient's tolerance, Monitored during session, Repositioned, RN gave pain meds during session    Home Living Family/patient expects to be discharged to:: Private residence Living Arrangements: Other relatives Available Help at Discharge: Family;Available PRN/intermittently Type of Home: House Home Access: Stairs to enter Entrance Stairs-Rails: Can reach both Entrance Stairs-Number of Steps: 1 small step-up into house Alternate Level Stairs-Number of Steps: flight Home Layout: Two level;1/2 bath on main level;Bed/bath upstairs Home Equipment: Shower seat - built [employer] fundraiser (2 wheels);Wheelchair - manual Additional Comments: Pt stays downstairs during the day while sister is at work. Has 1/2 bath downstairs and full bed/bath upstairs. Able to sleep downstairs on recliner if needed.    Prior Function Prior Level of Function : History of Falls (last six months)             Mobility Comments: mod I for stair navigation, ambulation without AD (PRN support from walls/furniture), PRN use of AD with community ambulation. ADLs Comments: IND with ADL's; sister assists with IADL's, transportation, and set up assist for medication management     Extremity/Trunk Assessment   Upper Extremity Assessment Upper Extremity Assessment: Overall WFL for tasks assessed (grossly 4-/5 throughout)    Lower Extremity Assessment Lower Extremity Assessment: Overall WFL for tasks assessed (grossly 4-/5 throughout; moderate edema distal LEs)       Communication   Communication Communication: No apparent difficulties  Cognition Arousal: Alert Behavior During Therapy: WFL for tasks assessed/performed Overall Cognitive Status: Within Functional Limits for tasks assessed                                          General Comments      Exercises     Assessment/Plan    PT Assessment Patient needs continued PT services  PT Problem List Decreased  strength;Decreased range of motion;Decreased activity tolerance;Decreased balance;Decreased mobility;Obesity;Decreased skin integrity;Pain;Decreased knowledge of use of DME;Decreased safety awareness;Decreased knowledge of precautions;Cardiopulmonary status limiting activity       PT Treatment Interventions DME instruction;Gait training;Functional mobility training;Stair training;Therapeutic activities;Balance training;Therapeutic exercise;Neuromuscular re-education;Patient/family education    PT Goals (Current goals can be found in the Care Plan section)  Acute Rehab PT Goals Patient Stated Goal: to return home PT Goal Formulation: With patient Time For Goal Achievement: 05/16/23 Potential to Achieve Goals: Good    Frequency Min 1X/week     Co-evaluation               AM-PAC PT "6 Clicks" Mobility  Outcome Measure Help needed turning from your back to your side while in a flat bed without using bedrails?: None Help needed moving from lying on your back to sitting on the side of a flat bed without using bedrails?: A Little Help needed moving to and from a bed to a chair (including a wheelchair)?: A Little Help needed standing up from a chair using your arms (e.g., wheelchair or bedside chair)?: A Little Help needed to walk in hospital room?: A Little Help needed climbing 3-5 steps with a railing? : A Lot 6 Click Score: 18    End of Session   Activity Tolerance: Patient tolerated treatment well Patient left: in bed;with call bell/phone within reach;with nursing/sitter in room Nurse Communication: Mobility status PT  Visit Diagnosis: Unsteadiness on feet (R26.81);Muscle weakness (generalized) (M62.81);History of falling (Z91.81);Pain Pain - part of body: Knee    Time: 1610-9604 PT Time Calculation (min) (ACUTE ONLY): 20 min   Charges:   PT Evaluation $PT Eval Moderate Complexity: 1 Mod   PT General Charges $$ ACUTE PT VISIT: 1 Visit         Quinlyn Tep H. Manson Passey, PT,  DPT, NCS 05/02/23, 11:15 PM [phone]

## 2023-05-02 NOTE — Progress Notes (Addendum)
Pt found twice, sitting on the EOB trying to get up to go to the bathroom. Pt is legally blind and is unsafe to do so. Educated him on safety and falls prevention. He was given a urinal and has been encouraged to use it. He has voided x2 his shift. Upon assisting him to the bathroom, once he returned to bed and was sitting on EOB it was noted that he was tachypnic 22-24 RR and he stated he felt SOB. Pulse ox obtained at that time was 81% RA.  Breathing had improved when RN returned from obtaining a nasal cannula and his O2 rose to 91% RA. 2L [place] applied and he was 95%. He continues to run A-fib and A-flutter, w/ occasional PVC's and PAC's. IRR 80's and 90's at this time.  Contacted on call provider via secure chat with the message as follows:  This pt was admitted this afternoon and he does have a Rt upper chest mediport. Wondering if we could have permission to access.

## 2023-05-02 NOTE — ED Notes (Signed)
 PT at bedside.

## 2023-05-02 NOTE — Progress Notes (Signed)
Chart review  Latest Reference Range & Units 04/26/23 04:35 04/27/23 04:32 04/29/23 16:55 05/01/23 03:26 05/02/23 10:54  Creatinine 0.61 - 1.24 mg/dL 7.82 (H) 9.56 (H) 2.13 (H) 2.84 (H) 3.52 (H)   Patient received IV contrast on 04/30/2023. Serum creatinine seems to be worsening since then. AKI likely secondary to IV contrast. Full consult to follow tomorrow.

## 2023-05-02 NOTE — ED Notes (Signed)
Assisted walking to and from restroom

## 2023-05-02 NOTE — Progress Notes (Addendum)
PROGRESS NOTE    Jeremiah Vance  WUJ:[id number] DOB: [birth year] DOA: 04/30/2023 PCP: Debera Lat, PA-C  Outpatient Specialists: oncology, cardiology    Brief Narrative:   Jeremiah Vance is a 53 y.o. male with medical history significant for obesity, recurrent cva, HFrecoveredEF, htn, ckd 3b, t2dm, stage 4 gastric adenocarcinoma, osa, cirrhosis, a fib, who presents with the above.   Discharged 2 days ago, was admitted for chf exacerbation, was diuresed.   Reports chest pain, non-exertional, yesterday afternoon that led him to contact EMS. Chest pain resolved. Also reports dyspnea. No fever, has mild non-productive cough. Reports compliance with home meds last couple of days, is making urine. Stable cancer-related pain, no vomiting or diarrhea.   Assessment & Plan:   Principal Problem:   Pleural effusion Active Problems:   History of CVA (cerebrovascular accident)   Paroxysmal atrial fibrillation (HCC)   Chronic kidney disease, stage 3b (HCC)   Moderate obstructive sleep apnea   Other cirrhosis of liver (HCC)   HFrEF (heart failure with reduced ejection fraction) (HCC)   Gastric cancer (HCC)   Chemotherapy-induced neuropathy (HCC)   PAF (paroxysmal atrial fibrillation) (HCC)   Diabetes mellitus (HCC)   Class 1 obesity with serious comorbidity in adult   Benign essential HTN   Status post thoracentesis   Palliative care encounter   Elevated troponin   Chest pain  # Pleural effusion B/l, chronic but worsening. Initially to be secondary to chf but fluid analysis consistent w/ exudate. 1.5 liters removed by IF on 1/29. No malignancy seen on cytology.  - diuresis on pause - monitor pleural fluid cultures, ngtd, no organism on gram stain - pulm following - provisional plan is to monitor effusions as outpt, consider pleurx catheter if recurrent   # Pericardial effusion Moderate, previously mild, tte shows no tamponade - cardiology following, advises no additional w/u for  the time being   # Chest pain, tropinemia Mild trops stable in the 50s no overt ischemic changes on EKG, likely demand   # Acute hypoxic respiratory failure 2/2 pleural effusions, chf. Cta no PE. Symptomatically improved after thoracentesis but has a persistent o2 requirement of 2 liters - continue [place] O2, will probably need to d/c on this  # Nausea/vomiting Today, no abd pain, having BMs. CT shows worsening abdominal adenopathy concerning for progression of metastatic disease - zofran prn   # HFmid-rangeEF TTE this month with ef 40-45, mod lvh, mildly reduced rv systolic function - hold home torsemide, farxiga, losart - holding additional diuresis - cont home metop - cardiology following   # Stage 4 gastric adenocarcinoma Prognosis appears poor given underlying worsening comorbidities, poor functional status. Dr. Cathie Hoops and Mr. Windy Fast have seen. Chemo will need to be paused until other medical conditions stable. Family not ready for hospice yet.    # Hx CVA Multiple, with aphasia. Patient/sister report no new symptoms - cont home apixaban, statin   # AKI CKD 3b Gfr of 32 on, baseline is in 40s, today dip to 26 with IV lasix, now down to 20 with additional lasix by pulm yesterday, with rising bun. CT does show possible metastatic left ureteral involvement but advises correlcation with urinalysis. He did get IV contrast on 1/29. - hold diuresis - hold losartan - continue albumin - may need some gentle fluids  - check UA   # GAD - home buspar, sertraline   # Chronic pain - home oxy   # HTN Bps low here - pulm has started  midodrine   # Cirrhosis Appears relatively compensated. Trace ascites seen on CT earlier this month. No report of bleeding - monitor   # T2DM Well controlled, only on farxiga - daily fasting sugars for now   # Obesity Noted   # OSa Not on cpap  # Debility PT consult pending, family is hesitant to take patient home     DVT prophylaxis:  apixaban Code Status: DNR (code status changed 1/29 after lengthy discussion with oncology palliative care) Family Communication: sister updated at bedside 1/29, no answer when called yesterday or today. Aunt updated at bedside 1/31 Consults: cardiology    Level of care: Med-Surg Status is: Inpatient Remains inpatient appropriate because: severity of illness    Consultants:  cardiology  Procedures: Thoracentesis 1/29  Antimicrobials:  none    Subjective: Repots dyspnea stable, tolerated breakfast.   Objective: Vitals:   05/01/23 2228 05/01/23 2230 05/02/23 0200 05/02/23 0600  BP: (!) 122/92 (!) 122/92 105/83 (!) 137/96  Pulse: 99 90 84 91  Resp:  (!) 23 (!) 27 (!) 23  Temp:  98.1 F (36.7 C) 98 F (36.7 C) 98.1 F (36.7 C)  TempSrc:  Oral Oral Oral  SpO2:  94% 96% 96%  Weight:      Height:        Intake/Output Summary (Last 24 hours) at 05/02/2023 1250 Last data filed at 05/01/2023 1910 Gross per 24 hour  Intake 50 ml  Output --  Net 50 ml   Filed Weights   04/29/23 1659  Weight: 106 kg    Examination:  General exam: Appears calm and chronically ill  Respiratory system: rales at bases Cardiovascular system: S1 & S2 heard, RRR.   Gastrointestinal system: Abdomen is obese mildly distended non tender Central nervous system: Alert and oriented. No focal neurological deficits. Extremities: Symmetric 5 x 5 power. Skin: No visible rashes, lesions or ulcers Psychiatry: Judgement and insight appear normal. Appears depressed.     Data Reviewed: I have personally reviewed following labs and imaging studies  CBC: Recent Labs  Lab 04/27/23 0432 04/29/23 1655 05/01/23 0326 05/02/23 1054  WBC 4.6 5.9 7.6 7.0  HGB 11.3* 11.5* 11.0* 10.6*  HCT 34.7* 35.9* 35.3* 33.2*  MCV 86.8 90.2 91.0 89.2  PLT 197 182 188 228   Basic Metabolic Panel: Recent Labs  Lab 04/26/23 0435 04/27/23 0432 04/29/23 1655 05/01/23 0326 05/02/23 1054  NA 136 138 137 135 132*   K 4.0 3.7 4.4 4.8 4.5  CL 103 107 102 103 101  CO2 22 20* 20* 20* 19*  GLUCOSE 130* 105* 156* 114* 165*  BUN 46* 46* 54* 62* 78*  CREATININE 2.22* 2.11* 2.36* 2.84* 3.52*  CALCIUM 8.9 8.7* 8.6* 8.6* 8.6*   GFR: Estimated Creatinine Clearance: 30.1 mL/min (A) (by C-G formula based on SCr of 3.52 mg/dL (H)). Liver Function Tests: Recent Labs  Lab 05/01/23 0326  AST 30  ALT 22  ALKPHOS 143*  BILITOT 0.9  PROT 7.1  7.3  ALBUMIN 3.0*   No results for input(s): "LIPASE", "AMYLASE" in the last 168 hours. No results for input(s): "AMMONIA" in the last 168 hours.  Coagulation Profile: No results for input(s): "INR", "PROTIME" in the last 168 hours. Cardiac Enzymes: No results for input(s): "CKTOTAL", "CKMB", "CKMBINDEX", "TROPONINI" in the last 168 hours. BNP (last 3 results) No results for input(s): "PROBNP" in the last 8760 hours. HbA1C: No results for input(s): "HGBA1C" in the last 72 hours. CBG: Recent Labs  Lab 04/27/23 1116  04/27/23 1634 04/27/23 1954 04/28/23 0738 04/28/23 1131  GLUCAP 139* 186* 141* 94 163*   Lipid Profile: No results for input(s): "CHOL", "HDL", "LDLCALC", "TRIG", "CHOLHDL", "LDLDIRECT" in the last 72 hours. Thyroid Function Tests: No results for input(s): "TSH", "T4TOTAL", "FREET4", "T3FREE", "THYROIDAB" in the last 72 hours. Anemia Panel: No results for input(s): "VITAMINB12", "FOLATE", "FERRITIN", "TIBC", "IRON", "RETICCTPCT" in the last 72 hours. Urine analysis:    Component Value Date/Time   COLORURINE STRAW (A) 09/27/2022 1100   APPEARANCEUR CLEAR (A) 09/27/2022 1100   LABSPEC 1.006 09/27/2022 1100   PHURINE 6.0 09/27/2022 1100   GLUCOSEU NEGATIVE 09/27/2022 1100   HGBUR NEGATIVE 09/27/2022 1100   BILIRUBINUR NEGATIVE 09/27/2022 1100   BILIRUBINUR negative 05/19/2020 1618   KETONESUR NEGATIVE 09/27/2022 1100   PROTEINUR NEGATIVE 09/27/2022 1100   UROBILINOGEN 0.2 05/19/2020 1618   UROBILINOGEN 2.0 (H) 10/21/2017 1102   NITRITE  NEGATIVE 09/27/2022 1100   LEUKOCYTESUR TRACE (A) 09/27/2022 1100   Sepsis Labs: @LABRCNTIP (procalcitonin:4,lacticidven:4)  ) Recent Results (from the past 240 hours)  Resp panel by RT-PCR (RSV, Flu A&B, Covid) Anterior Nasal Swab     Status: None   Collection Time: 04/23/23  5:13 PM   Specimen: Anterior Nasal Swab  Result Value Ref Range Status   SARS Coronavirus 2 by RT PCR NEGATIVE NEGATIVE Final    Comment: (NOTE) SARS-CoV-2 target nucleic acids are NOT DETECTED.  The SARS-CoV-2 RNA is generally detectable in upper respiratory specimens during the acute phase of infection. The lowest concentration of SARS-CoV-2 viral copies this assay can detect is 138 copies/mL. A negative result does not preclude SARS-Cov-2 infection and should not be used as the sole basis for treatment or other patient management decisions. A negative result may occur with  improper specimen collection/handling, submission of specimen other than nasopharyngeal swab, presence of viral mutation(s) within the areas targeted by this assay, and inadequate number of viral copies(<138 copies/mL). A negative result must be combined with clinical observations, patient history, and epidemiological information. The expected result is Negative.  Fact Sheet for Patients:  BloggerCourse.com  Fact Sheet for Healthcare Providers:  SeriousBroker.it  This test is no t yet approved or cleared by the [place] FDA and  has been authorized for detection and/or diagnosis of SARS-CoV-2 by FDA under an Emergency Use Authorization (EUA). This EUA will remain  in effect (meaning this test can be used) for the duration of the COVID-19 declaration under Section 564(b)(1) of the Act, 21 U.S.C.section 360bbb-3(b)(1), unless the authorization is terminated  or revoked sooner.       Influenza A by PCR NEGATIVE NEGATIVE Final   Influenza B by PCR NEGATIVE NEGATIVE Final     Comment: (NOTE) The Xpert Xpress SARS-CoV-2/FLU/RSV plus assay is intended as an aid in the diagnosis of influenza from Nasopharyngeal swab specimens and should not be used as a sole basis for treatment. Nasal washings and aspirates are unacceptable for Xpert Xpress SARS-CoV-2/FLU/RSV testing.  Fact Sheet for Patients: BloggerCourse.com  Fact Sheet for Healthcare Providers: SeriousBroker.it  This test is not yet approved or cleared by the [place] FDA and has been authorized for detection and/or diagnosis of SARS-CoV-2 by FDA under an Emergency Use Authorization (EUA). This EUA will remain in effect (meaning this test can be used) for the duration of the COVID-19 declaration under Section 564(b)(1) of the Act, 21 U.S.C. section 360bbb-3(b)(1), unless the authorization is terminated or revoked.     Resp Syncytial Virus by PCR NEGATIVE NEGATIVE Final  Comment: (NOTE) Fact Sheet for Patients: BloggerCourse.com  Fact Sheet for Healthcare Providers: SeriousBroker.it  This test is not yet approved or cleared by the [place] FDA and has been authorized for detection and/or diagnosis of SARS-CoV-2 by FDA under an Emergency Use Authorization (EUA). This EUA will remain in effect (meaning this test can be used) for the duration of the COVID-19 declaration under Section 564(b)(1) of the Act, 21 U.S.C. section 360bbb-3(b)(1), unless the authorization is terminated or revoked.  Performed at [facility], [address] [place], [place] [address]   Culture, blood (routine x 2)     Status: None   Collection Time: 04/23/23  9:46 PM   Specimen: BLOOD  Result Value Ref Range Status   Specimen Description BLOOD RIGHT ARM  Final   Special Requests   Final    BOTTLES DRAWN AEROBIC AND ANAEROBIC Blood Culture results may not be optimal due to an inadequate volume of blood  received in culture bottles   Culture   Final    NO GROWTH 5 DAYS Performed at [facility], [address] [place], [place] [address]    Report Status 04/28/2023 FINAL  Final  Culture, blood (routine x 2)     Status: None   Collection Time: 04/23/23  9:46 PM   Specimen: BLOOD  Result Value Ref Range Status   Specimen Description BLOOD LEFT FOREARM  Final   Special Requests   Final    BOTTLES DRAWN AEROBIC ONLY Blood Culture results may not be optimal due to an inadequate volume of blood received in culture bottles   Culture   Final    NO GROWTH 5 DAYS Performed at [facility], [address] [place], [place] [address]    Report Status 04/28/2023 FINAL  Final  Resp panel by RT-PCR (RSV, Flu A&B, Covid) Anterior Nasal Swab     Status: None   Collection Time: 04/30/23  6:22 AM   Specimen: Anterior Nasal Swab  Result Value Ref Range Status   SARS Coronavirus 2 by RT PCR NEGATIVE NEGATIVE Final    Comment: (NOTE) SARS-CoV-2 target nucleic acids are NOT DETECTED.  The SARS-CoV-2 RNA is generally detectable in upper respiratory specimens during the acute phase of infection. The lowest concentration of SARS-CoV-2 viral copies this assay can detect is 138 copies/mL. A negative result does not preclude SARS-Cov-2 infection and should not be used as the sole basis for treatment or other patient management decisions. A negative result may occur with  improper specimen collection/handling, submission of specimen other than nasopharyngeal swab, presence of viral mutation(s) within the areas targeted by this assay, and inadequate number of viral copies(<138 copies/mL). A negative result must be combined with clinical observations, patient history, and epidemiological information. The expected result is Negative.  Fact Sheet for Patients:  BloggerCourse.com  Fact Sheet for Healthcare Providers:   SeriousBroker.it  This test is no t yet approved or cleared by the [place] FDA and  has been authorized for detection and/or diagnosis of SARS-CoV-2 by FDA under an Emergency Use Authorization (EUA). This EUA will remain  in effect (meaning this test can be used) for the duration of the COVID-19 declaration under Section 564(b)(1) of the Act, 21 U.S.C.section 360bbb-3(b)(1), unless the authorization is terminated  or revoked sooner.       Influenza A by PCR NEGATIVE NEGATIVE Final   Influenza B by PCR NEGATIVE NEGATIVE Final    Comment: (NOTE) The Xpert Xpress SARS-CoV-2/FLU/RSV plus assay is  intended as an aid in the diagnosis of influenza from Nasopharyngeal swab specimens and should not be used as a sole basis for treatment. Nasal washings and aspirates are unacceptable for Xpert Xpress SARS-CoV-2/FLU/RSV testing.  Fact Sheet for Patients: BloggerCourse.com  Fact Sheet for Healthcare Providers: SeriousBroker.it  This test is not yet approved or cleared by the [place] FDA and has been authorized for detection and/or diagnosis of SARS-CoV-2 by FDA under an Emergency Use Authorization (EUA). This EUA will remain in effect (meaning this test can be used) for the duration of the COVID-19 declaration under Section 564(b)(1) of the Act, 21 U.S.C. section 360bbb-3(b)(1), unless the authorization is terminated or revoked.     Resp Syncytial Virus by PCR NEGATIVE NEGATIVE Final    Comment: (NOTE) Fact Sheet for Patients: BloggerCourse.com  Fact Sheet for Healthcare Providers: SeriousBroker.it  This test is not yet approved or cleared by the [place] FDA and has been authorized for detection and/or diagnosis of SARS-CoV-2 by FDA under an Emergency Use Authorization (EUA). This EUA will remain in effect (meaning this test can be used) for  the duration of the COVID-19 declaration under Section 564(b)(1) of the Act, 21 U.S.C. section 360bbb-3(b)(1), unless the authorization is terminated or revoked.  Performed at [facility], [address] [place], [place] [address]   Body fluid culture w Gram Stain     Status: None (Preliminary result)   Collection Time: 04/30/23 10:52 AM   Specimen: PATH Cytology Pleural fluid  Result Value Ref Range Status   Specimen Description   Final    PLEURAL Performed at [facility], [address], [place], [place] [address]    Special Requests   Final    PLEURAL Performed at [facility], [address] [place], [place] [address]    Gram Stain   Final    FEW WBC PRESENT, PREDOMINANTLY MONONUCLEAR NO ORGANISMS SEEN    Culture   Final    NO GROWTH < 24 HOURS Performed at [facility], [address], [place], [place] [address]    Report Status PENDING  Incomplete         Radiology Studies: CT ABDOMEN PELVIS WO CONTRAST Result Date: 05/01/2023 CLINICAL DATA:  History of gastric adenocarcinoma with nausea and vomiting. * Tracking Code: BO * EXAM: CT ABDOMEN AND PELVIS WITHOUT CONTRAST TECHNIQUE: Multidetector CT imaging of the abdomen and pelvis was performed following the standard protocol without IV contrast. RADIATION DOSE REDUCTION: This exam was performed according to the departmental dose-optimization program which includes automated exposure control, adjustment of the mA and/or kV according to patient size and/or use of iterative reconstruction technique. COMPARISON:  CT abdomen and pelvis dated 04/10/2023, CTA chest dated 04/30/2023, nuclear medicine PET dated 12/31/2022 FINDINGS: Lower chest: Partially imaged central venous catheter terminates at the superior cavoatrial junction. Re-expansion of partially imaged lingula and left lower lobe status post thoracentesis. Diffuse patchy ground-glass opacities of the lingula and partially imaged left  lower lobe. Similar large right and decreased moderate left pleural effusions. No pneumothorax. Similar pericardial effusion. Coronary artery calcifications. Hepatobiliary: No focal hepatic lesions. No intra or extrahepatic biliary ductal dilation. Contracted gallbladder demonstrates cholelithiasis. Adrenals Pancreas: No focal lesions or main ductal dilation. Spleen: Normal in size without focal abnormality. Adrenals/Urinary Tract: Unchanged 1.3 cm left adrenal nodule measures 13 HU, not previously FDG-avid, likely adenoma. No specific follow-up imaging recommended. No right adrenal nodule. Mild left hydronephrosis contains excreted renal contrast material from recent CTA  chest. Contrast is seen to the level of the proximal ureter, which demonstrates mild circumferential mural thickening with adjacent soft tissue irregularity (2:52), inseparable from the anterior psoas. Excreted contrast material within the urinary bladder. Stomach/Bowel: Decreased conspicuity of mural thickening involving the gastric cardia. No evidence of bowel wall thickening, distention, or inflammatory changes. Normal appendix, which is contained within the hernia. Vascular/Lymphatic: Aortic atherosclerosis. Partially imaged paraesophageal lymph nodes measuring up to 9 mm (2:2) are unchanged. Continued decrease in size of perigastric node measuring 2.2 x 2.0 cm (2:34), previously 3.7 x 2.1 cm. Interval increased multi station periportal, retroaortic, and pelvic lymphadenopathy, for example -14 mm portacaval (2:33), previously 13 mm -10 mm left para-aortic (2:39), previously 5 mm -14 mm right external iliac (2:74), previously 12 mm Reproductive: Prostate is unremarkable. Other: Trace ascites.  No free air or fluid collection. Musculoskeletal: No acute or abnormal lytic or blastic osseous lesions. Multilevel degenerative changes of the partially imaged thoracic and lumbar spine. Large ventral midline lower abdominal wall hernia containing  nonobstructed loops of small and large bowel, as before. IMPRESSION: 1. Mild left hydronephrosis contains excreted renal contrast material from recent CTA chest, suggestive of delayed nephrogram. Contrast is seen to the level of the proximal ureter, which demonstrates mild circumferential mural thickening with adjacent soft tissue irregularity, inseparable from the anterior psoas. Findings are suspicious for ureteral involvement by metastatic disease, less likely ascending urinary tract infection. Recommend correlation with urinalysis. 2. Interval increased multi station periportal, retroaortic, and pelvic lymphadenopathy, indeterminate, however suspicious for metastatic disease. 3. Decreased conspicuity of mural thickening involving the gastric cardia. Decreased size of perigastric nodule. 4. Re-expansion of partially imaged lingula and left lower lobe status post thoracentesis. Diffuse patchy ground-glass opacities of the lingula and partially imaged left lower lobe, which may represent re-expansion pulmonary edema. 5. Similar large right and decreased moderate left pleural effusions. 6. Similar pericardial effusion. 7. Aortic Atherosclerosis (ICD10-I70.0). Coronary artery calcifications. Assessment for potential risk factor modification, dietary therapy or pharmacologic therapy may be warranted, if clinically indicated. Electronically Signed   By: Agustin Cree M.D.   On: 05/01/2023 15:51   ECHOCARDIOGRAM LIMITED Result Date: 05/01/2023    ECHOCARDIOGRAM LIMITED REPORT   Patient Name:   Jeremiah Vance Date of Exam: 04/30/2023 Medical Rec #:  [id number]       Height:       71.0 in Accession #:    [id number]      Weight:       233.7 lb Date of Birth:  04/06/1970       BSA:          2.253 m Patient Age:    52 years        BP:           127/89 mmHg Patient Gender: M               HR:           95 bpm. Exam Location:  [facility] Procedure: 2D Echo and Limited Echo Indications:     I31.3 Pericardial Effusion  History:          Patient has prior history of Echocardiogram examinations, most                  recent 04/24/2023. Cardiomyopathy, Stroke, Arrythmias:Atrial                  Fibrillation; Risk Factors:Hypertension and Diabetes.  Sonographer:     Sedonia Small Rodgers-Jones RDCS Referring  Phys:  [id number]AL[address] BERGE Diagnosing Phys: Julien Nordmann MD IMPRESSIONS  1. Left ventricular ejection fraction, by estimation, is 40 to 45%. The left ventricle has mildly decreased function. The left ventricle demonstrates global hypokinesis. There is moderate left ventricular hypertrophy. Left ventricular diastolic parameters are indeterminate.  2. Right ventricular systolic function is mildly reduced. The right ventricular size is normal. Tricuspid regurgitation signal is inadequate for assessing PA pressure.  3. Moderate pericardial effusion. There is no evidence of cardiac tamponade.  4. The mitral valve is normal in structure. No evidence of mitral valve regurgitation. No evidence of mitral stenosis.  5. The aortic valve is normal in structure. Aortic valve regurgitation is not visualized. No aortic stenosis is present.  6. The inferior vena cava is dilated in size with >50% respiratory variability, suggesting right atrial pressure of 8 mmHg. FINDINGS  Left Ventricle: Left ventricular ejection fraction, by estimation, is 40 to 45%. The left ventricle has mildly decreased function. The left ventricle demonstrates global hypokinesis. The left ventricular internal cavity size was normal in size. There is  moderate left ventricular hypertrophy. Left ventricular diastolic parameters are indeterminate. Right Ventricle: The right ventricular size is normal. No increase in right ventricular wall thickness. Right ventricular systolic function is mildly reduced. Tricuspid regurgitation signal is inadequate for assessing PA pressure. Left Atrium: Left atrial size was normal in size. Right Atrium: Right atrial size was normal in size. Pericardium: A  moderately sized pericardial effusion is present. There is no evidence of cardiac tamponade. Mitral Valve: The mitral valve is normal in structure. No evidence of mitral valve stenosis. Tricuspid Valve: The tricuspid valve is normal in structure. Tricuspid valve regurgitation is not demonstrated. No evidence of tricuspid stenosis. Aortic Valve: The aortic valve is normal in structure. Aortic valve regurgitation is not visualized. No aortic stenosis is present. Pulmonic Valve: The pulmonic valve was normal in structure. Pulmonic valve regurgitation is not visualized. No evidence of pulmonic stenosis. Aorta: The aortic root is normal in size and structure. Venous: The inferior vena cava is dilated in size with greater than 50% respiratory variability, suggesting right atrial pressure of 8 mmHg. IAS/Shunts: No atrial level shunt detected by color flow Doppler. LEFT VENTRICLE PLAX 2D LVIDd:         4.90 cm LVIDs:         4.10 cm LV PW:         1.30 cm LV IVS:        1.20 cm  IVC IVC diam: 2.50 cm LEFT ATRIUM         Index LA diam:    5.20 cm 2.31 cm/m   AORTA Ao Root diam: 3.80 cm Julien Nordmann MD Electronically signed by Julien Nordmann MD Signature Date/Time: 05/01/2023/11:13:46 AM    Final         Scheduled Meds:  apixaban  5 mg Oral BID   atorvastatin  40 mg Oral q1800   busPIRone  7.5 mg Oral BID   metoprolol tartrate  12.5 mg Oral BID   midodrine  10 mg Oral TID WC   [START ON 05/03/2023] sertraline  75 mg Oral Daily   Continuous Infusions:  albumin human 12.5 g (05/02/23 1100)     LOS: 2 days     Silvano Bilis, MD [facility]   If 7PM-7AM, please contact night-coverage [email or web address] Password [facility] 05/02/2023, 12:50 PM

## 2023-05-02 NOTE — ED Notes (Signed)
Oncology at bedside

## 2023-05-02 NOTE — Progress Notes (Signed)
Hematology/Oncology Progress note [phone] [phone]     Patient Care Team: Debera Lat, PA-C as PCP - General (Physician Assistant) Debbe Odea, MD as PCP - Cardiology (Cardiology) Toney Reil, MD as Consulting Physician (Gastroenterology) Toney Reil, MD as Consulting Physician (Gastroenterology) Rickard Patience, MD as Consulting Physician (Oncology) Benita Gutter, RN as Oncology Nurse Navigator   Name of the patient: Jeremiah Vance  [id number]  1971/03/06  Date of visit: 05/02/23   INTERVAL HISTORY-  No acute overnight event. Patient reports sob is stable. He feels tired. Appetite is poor  Aunt is at bedside.     Allergies  Allergen Reactions   Pollen Extract Other (See Comments)   Sulfa Antibiotics     Patient Active Problem List   Diagnosis Date Noted   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    Priority: High   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    Priority: Medium    Encounter for antineoplastic chemotherapy 10/02/2022    Priority: Medium    Paroxysmal atrial fibrillation (HCC) 09/08/2022    Priority: Medium    Other cirrhosis of liver (HCC)     Priority: Medium    Clavicular fracture 01/07/2023    Priority: Low   Ischemic cerebrovascular accident (CVA) of frontal lobe (HCC) 11/27/2022    Priority: Low   Goals of care, counseling/discussion 09/19/2022    Priority: Low   Anxiety associated with cancer diagnosis (HCC) 09/19/2022    Priority: Low   IDA (iron deficiency anemia) 09/09/2022    Priority: Low   Cough     Priority: Low   CKD (chronic kidney disease) stage 3, GFR 30-59 ml/min (HCC) 08/31/2019    Priority: Low   Pleural effusion 04/30/2023   Status post thoracentesis 04/30/2023   Palliative care encounter 04/30/2023   Elevated troponin 04/30/2023   Chest pain 04/30/2023   Permanent atrial fibrillation (HCC) 04/24/2023   SOB (shortness of breath) 04/23/2023   PAF (paroxysmal atrial  fibrillation) (HCC) 11/27/2022   Diabetes mellitus (HCC) 11/27/2022   Class 1 obesity with serious comorbidity in adult 11/27/2022   Benign essential HTN 11/27/2022   Acute CVA (cerebrovascular accident) (HCC) 11/22/2022   Stroke (HCC) 11/21/2022   Depression with anxiety 11/21/2022   Obesity (BMI 30-39.9) 11/21/2022   Hypomagnesemia 11/21/2022   Mood disorder (HCC) 10/29/2022   Type 2 diabetes mellitus with hyperglycemia, without long-term current use of insulin (HCC) 10/29/2022   Acute on chronic systolic heart failure (HCC) 09/27/2022   Chronic kidney disease, stage 3b (HCC) 09/27/2022   Type II diabetes mellitus with renal manifestations (HCC) 09/27/2022   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   Dysarthria 05/20/2022   Dyslipidemia 01/16/2022   Acute on chronic diastolic CHF (congestive heart failure) (HCC) 01/16/2022   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    HFrEF (heart failure with reduced ejection fraction) (HCC)    History of CVA (cerebrovascular accident) 10/17/2019   DM (diabetes mellitus), type 2 with complications (HCC) 07/08/2019   Moderate obstructive sleep apnea 02/11/2018   Chronic fatigue 12/16/2017   Essential hypertension 12/16/2017   History of small bowel obstruction 10/24/2017   AKI (acute kidney injury) (HCC)    Hyperlipidemia    Gout      Past Medical History:  Diagnosis Date   Acute ischemic stroke (HCC) 09/06/2017   Acute renal failure superimposed on stage 3a chronic kidney disease (HCC) 07/08/2019   Acute right-sided weakness    Allergies    Anasarca  Anemia 07/10/2017   Arthritis    Cancer (HCC)    stomach and esophageal cancer stage 4   Chicken pox    CKD (chronic kidney disease) stage 3, GFR 30-59 ml/min (HCC) 06/2017   Depression    Elevated troponin 07/08/2019   Heart failure with mid-range ejection fraction (HCC)    a. 06/2017 Echo: EF 40-45%; b. 07/2019 Echo: EF 50-55%; c. 10/2021 Echo: EF 30-35%;  d. 10/2022 Echo: EF 55-60%; e. 04/2023 Echo: EF 40-45%, glob HK, mod LVH, mildly reduced RV fxn, mod dil LA, mild MR, Ao root 41mm, asc Ao 42mm.   High cholesterol    History of cardiomyopathy    LVEF 40 to 45% in April 2019 - subsequently normalized   Hyperbilirubinemia 07/10/2017   Hypertension    Ischemic stroke (HCC)    Small left internal capsule infarct due to lacunar disease   Morbid obesity (HCC)    NICM (nonischemic cardiomyopathy) (HCC)    a. 06/2017 Echo: EF 40-45%; b. 07/2019 Echo: EF 50-55%; c. 10/2021 Echo: EF 30-35%; d. 12/2021 MV: EF 60%, no ischemia/scar; e. 10/2022 Echo: EF 55-60%; f. 04/2023 Echo: EF 40-45%.   Normocytic anemia 12/16/2017   Permanent atrial fibrillation (HCC)    a. CHA2DS2VASc = 5-->eliquis.   Recurrent incisional hernia with incarceration s/p repair 10/22/2017 10/21/2017   Stroke (HCC) 08/2017   "right sided weakness since; getting stronger though" (10/22/2017)   Type 2 diabetes mellitus [facility])      Past Surgical History:  Procedure Laterality Date   ABDOMINAL HERNIA REPAIR  2008; 10/22/2017   "scope; OPEN REPAIR INCARCERATED VENTRAL HERNIA   ESOPHAGOGASTRODUODENOSCOPY (EGD) WITH PROPOFOL N/A 09/10/2022   Procedure: ESOPHAGOGASTRODUODENOSCOPY (EGD) WITH PROPOFOL;  Surgeon: Toney Reil, MD;  Location: [facility] ENDOSCOPY;  Service: Gastroenterology;  Laterality: N/A;   ESOPHAGOGASTRODUODENOSCOPY (EGD) WITH PROPOFOL N/A 09/28/2022   Procedure: ESOPHAGOGASTRODUODENOSCOPY (EGD) WITH PROPOFOL;  Surgeon: Jaynie Collins, DO;  Location: [facility] ENDOSCOPY;  Service: Gastroenterology;  Laterality: N/A;   HEMOSTASIS CONTROL  09/28/2022   Procedure: HEMOSTASIS CONTROL;  Surgeon: Jaynie Collins, DO;  Location: [facility] ENDOSCOPY;  Service: Gastroenterology;;   HERNIA REPAIR     KNEE ARTHROSCOPY Right 1989   PORTA CATH INSERTION N/A 09/25/2022   Procedure: PORTA CATH INSERTION;  Surgeon: Annice Needy, MD;  Location: [facility] INVASIVE CV LAB;  Service: Cardiovascular;   Laterality: N/A;   VENTRAL HERNIA REPAIR N/A 10/22/2017   Procedure: OPEN REPAIR INCARCERATED VENTRAL HERNIA;  Surgeon: Glenna Fellows, MD;  Location: [facility] OR;  Service: General;  Laterality: N/A;    Social History   Socioeconomic History   Marital status: Divorced    Spouse name: Not on file   Number of children: 0   Years of education: Not on file   Highest education level: Not on file  Occupational History   Occupation: Disabled  Tobacco Use   Smoking status: Never   Smokeless tobacco: Never  Vaping Use   Vaping status: Never Used  Substance and Sexual Activity   Alcohol use: Yes    Comment: occasional beer   Drug use: Never   Sexual activity: Not Currently  Other Topics Concern   Not on file  Social History Narrative   He lives with his sister , [name] and she is his MPOA after his stokes   Social Drivers of Corporate investment banker Strain: Low Risk  (04/24/2023)   Overall Financial Resource Strain (CARDIA)    Difficulty of Paying Living Expenses: Not hard at  all  Food Insecurity: No Food Insecurity (04/30/2023)   Hunger Vital Sign    Worried About Running Out of Food in the Last Year: Never true    Ran Out of Food in the Last Year: Never true  Transportation Needs: No Transportation Needs (04/30/2023)   PRAPARE - Administrator, Civil Service (Medical): No    Lack of Transportation (Non-Medical): No  Physical Activity: Not on file  Stress: Not on file  Social Connections: Moderately Isolated (04/30/2023)   Social Connection and Isolation Panel [NHANES]    Frequency of Communication with Friends and Family: Three times a week    Frequency of Social Gatherings with Friends and Family: Once a week    Attends Religious Services: 1 to 4 times per year    Active Member of [employer] or Organizations: No    Attends Banker Meetings: Never    Marital Status: Divorced  Catering manager Violence: Not At Risk (04/30/2023)   Humiliation, Afraid,  Rape, and Kick questionnaire    Fear of Current or Ex-Partner: No    Emotionally Abused: No    Physically Abused: No    Sexually Abused: No     Family History  Problem Relation Age of Onset   Diabetes Mother    Stroke Mother    Arthritis Mother    Depression Mother    Heart disease Mother    Hypertension Mother    Learning disabilities Mother    Mental illness Mother    Sleep apnea Mother    Diabetes Father    Heart disease Father    Arthritis Father    Hearing loss Father    Hyperlipidemia Father    Heart attack Father    Hypertension Father    Stroke Father    Diabetes Sister    Depression Sister    Diabetes Sister    Hypertension Sister    Mental illness Sister    Sleep apnea Sister    Diabetes Maternal Grandmother    Heart disease Maternal Grandmother    Depression Maternal Grandmother    Hyperlipidemia Maternal Grandmother    Hypertension Maternal Grandmother    Stroke Maternal Grandmother    Hypertension Maternal Grandfather    Hyperlipidemia Maternal Grandfather    Stroke Maternal Grandfather    Arthritis Paternal Grandmother    Hearing loss Paternal Grandmother    Stroke Paternal Grandfather    Heart disease Paternal Grandfather    Arthritis Paternal Grandfather    Heart attack Paternal Grandfather    Melanoma Other      Current Facility-Administered Medications:    albumin human 25 % solution 12.5 g, 12.5 g, Intravenous, Daily, Aleskerov, Fuad, MD, Stopped at 05/02/23 1237   apixaban (ELIQUIS) tablet 5 mg, 5 mg, Oral, BID, Nazari, Walid A, RPH, 5 mg at 05/02/23 1054   atorvastatin (LIPITOR) tablet 40 mg, 40 mg, Oral, q1800, Wouk, Wilfred Curtis, MD, 40 mg at 05/02/23 1835   busPIRone (BUSPAR) tablet 7.5 mg, 7.5 mg, Oral, BID, Wouk, Wilfred Curtis, MD, 7.5 mg at 05/02/23 1054   metoprolol tartrate (LOPRESSOR) tablet 12.5 mg, 12.5 mg, Oral, BID, Wouk, Wilfred Curtis, MD, 12.5 mg at 05/02/23 1058   midodrine (PROAMATINE) tablet 10 mg, 10 mg, Oral, TID WC,  Aleskerov, Fuad, MD, 10 mg at 05/02/23 1835   ondansetron (ZOFRAN-ODT) disintegrating tablet 4 mg, 4 mg, Oral, Q8H PRN, Wouk, Wilfred Curtis, MD, 4 mg at 05/01/23 1244   oxyCODONE (Oxy IR/ROXICODONE) immediate release tablet 5 mg,  5 mg, Oral, Q6H PRN, Wouk, Wilfred Curtis, MD, 5 mg at 05/02/23 1109   [START ON 05/03/2023] sertraline (ZOLOFT) tablet 75 mg, 75 mg, Oral, Daily, Kathrynn Running, MD   Physical exam:  Vitals:   05/02/23 0700 05/02/23 1100 05/02/23 1225 05/02/23 2108  BP: 111/84 118/80 (!) 125/92 (!) 117/90  Pulse: 98 (!) 115 88 61  Resp: (!) 29 15 (!) 33 16  Temp:  98 F (36.7 C)  98.7 F (37.1 C)  TempSrc:  Oral    SpO2: 95% 92% (!) 84% 90%  Weight:      Height:       Physical Exam Constitutional:      General: He is not in acute distress.    Appearance: He is obese. He is not diaphoretic.  HENT:     Head: Normocephalic and atraumatic.  Eyes:     General: No scleral icterus. Cardiovascular:     Rate and Rhythm: Normal rate and regular rhythm.  Pulmonary:     Effort: Pulmonary effort is normal. No respiratory distress.     Comments: On nasal cannula oxygen.  Decreased breath sound bilaterally, R>L  Abdominal:     General: Bowel sounds are normal. There is no distension.     Palpations: Abdomen is soft.  Musculoskeletal:        General: Normal range of motion.     Cervical back: Normal range of motion and neck supple.  Skin:    General: Skin is dry.     Findings: No erythema.  Neurological:     Mental Status: He is alert and oriented to person, place, and time. Mental status is at baseline.     Motor: No abnormal muscle tone.  Psychiatric:        Mood and Affect: Affect normal.     Comments: flat       Labs    Latest Ref Rng & Units 05/02/2023   10:54 AM 05/01/2023    3:26 AM 04/29/2023    4:55 PM  CBC  WBC 4.0 - 10.5 K/uL 7.0  7.6  5.9   Hemoglobin 13.0 - 17.0 g/dL 78.4  69.6  29.5   Hematocrit 39.0 - 52.0 % 33.2  35.3  35.9   Platelets 150 - 400  K/uL 228  188  182       Latest Ref Rng & Units 05/02/2023   10:54 AM 05/01/2023    3:26 AM 04/29/2023    4:55 PM  CMP  Glucose 70 - 99 mg/dL 284  132  440   BUN 6 - 20 mg/dL 78  62  54   Creatinine 0.61 - 1.24 mg/dL 1.02  7.25  3.66   Sodium 135 - 145 mmol/L 132  135  137   Potassium 3.5 - 5.1 mmol/L 4.5  4.8  4.4   Chloride 98 - 111 mmol/L 101  103  102   CO2 22 - 32 mmol/L 19  20  20    Calcium 8.9 - 10.3 mg/dL 8.6  8.6  8.6   Total Protein 6.5 - 8.1 g/dL 6.5 - 8.1 g/dL  7.3    7.1    Total Bilirubin 0.0 - 1.2 mg/dL  0.9    Alkaline Phos 38 - 126 U/L  143    AST 15 - 41 U/L  30    ALT 0 - 44 U/L  22       RADIOGRAPHIC STUDIES: I have personally reviewed the radiological images as listed  and agreed with the findings in the report. CT ABDOMEN PELVIS WO CONTRAST Result Date: 05/01/2023 CLINICAL DATA:  History of gastric adenocarcinoma with nausea and vomiting. * Tracking Code: BO * EXAM: CT ABDOMEN AND PELVIS WITHOUT CONTRAST TECHNIQUE: Multidetector CT imaging of the abdomen and pelvis was performed following the standard protocol without IV contrast. RADIATION DOSE REDUCTION: This exam was performed according to the departmental dose-optimization program which includes automated exposure control, adjustment of the mA and/or kV according to patient size and/or use of iterative reconstruction technique. COMPARISON:  CT abdomen and pelvis dated 04/10/2023, CTA chest dated 04/30/2023, nuclear medicine PET dated 12/31/2022 FINDINGS: Lower chest: Partially imaged central venous catheter terminates at the superior cavoatrial junction. Re-expansion of partially imaged lingula and left lower lobe status post thoracentesis. Diffuse patchy ground-glass opacities of the lingula and partially imaged left lower lobe. Similar large right and decreased moderate left pleural effusions. No pneumothorax. Similar pericardial effusion. Coronary artery calcifications. Hepatobiliary: No focal hepatic lesions. No  intra or extrahepatic biliary ductal dilation. Contracted gallbladder demonstrates cholelithiasis. Adrenals Pancreas: No focal lesions or main ductal dilation. Spleen: Normal in size without focal abnormality. Adrenals/Urinary Tract: Unchanged 1.3 cm left adrenal nodule measures 13 HU, not previously FDG-avid, likely adenoma. No specific follow-up imaging recommended. No right adrenal nodule. Mild left hydronephrosis contains excreted renal contrast material from recent CTA chest. Contrast is seen to the level of the proximal ureter, which demonstrates mild circumferential mural thickening with adjacent soft tissue irregularity (2:52), inseparable from the anterior psoas. Excreted contrast material within the urinary bladder. Stomach/Bowel: Decreased conspicuity of mural thickening involving the gastric cardia. No evidence of bowel wall thickening, distention, or inflammatory changes. Normal appendix, which is contained within the hernia. Vascular/Lymphatic: Aortic atherosclerosis. Partially imaged paraesophageal lymph nodes measuring up to 9 mm (2:2) are unchanged. Continued decrease in size of perigastric node measuring 2.2 x 2.0 cm (2:34), previously 3.7 x 2.1 cm. Interval increased multi station periportal, retroaortic, and pelvic lymphadenopathy, for example -14 mm portacaval (2:33), previously 13 mm -10 mm left para-aortic (2:39), previously 5 mm -14 mm right external iliac (2:74), previously 12 mm Reproductive: Prostate is unremarkable. Other: Trace ascites.  No free air or fluid collection. Musculoskeletal: No acute or abnormal lytic or blastic osseous lesions. Multilevel degenerative changes of the partially imaged thoracic and lumbar spine. Large ventral midline lower abdominal wall hernia containing nonobstructed loops of small and large bowel, as before. IMPRESSION: 1. Mild left hydronephrosis contains excreted renal contrast material from recent CTA chest, suggestive of delayed nephrogram. Contrast is  seen to the level of the proximal ureter, which demonstrates mild circumferential mural thickening with adjacent soft tissue irregularity, inseparable from the anterior psoas. Findings are suspicious for ureteral involvement by metastatic disease, less likely ascending urinary tract infection. Recommend correlation with urinalysis. 2. Interval increased multi station periportal, retroaortic, and pelvic lymphadenopathy, indeterminate, however suspicious for metastatic disease. 3. Decreased conspicuity of mural thickening involving the gastric cardia. Decreased size of perigastric nodule. 4. Re-expansion of partially imaged lingula and left lower lobe status post thoracentesis. Diffuse patchy ground-glass opacities of the lingula and partially imaged left lower lobe, which may represent re-expansion pulmonary edema. 5. Similar large right and decreased moderate left pleural effusions. 6. Similar pericardial effusion. 7. Aortic Atherosclerosis (ICD10-I70.0). Coronary artery calcifications. Assessment for potential risk factor modification, dietary therapy or pharmacologic therapy may be warranted, if clinically indicated. Electronically Signed   By: Agustin Cree M.D.   On: 05/01/2023 15:51   ECHOCARDIOGRAM LIMITED  Result Date: 05/01/2023    ECHOCARDIOGRAM LIMITED REPORT   Patient Name:   GARRETH BURNSWORTH Date of Exam: 04/30/2023 Medical Rec #:  [id number]       Height:       71.0 in Accession #:    [id number]      Weight:       233.7 lb Date of Birth:  06/13/1970       BSA:          2.253 m Patient Age:    52 years        BP:           127/89 mmHg Patient Gender: M               HR:           95 bpm. Exam Location:  [facility] Procedure: 2D Echo and Limited Echo Indications:     I31.3 Pericardial Effusion  History:         Patient has prior history of Echocardiogram examinations, most                  recent 04/24/2023. Cardiomyopathy, Stroke, Arrythmias:Atrial                  Fibrillation; Risk Factors:Hypertension and  Diabetes.  Sonographer:     Sedonia Small Rodgers-Jones RDCS Referring Phys:  [id number] CHRISTOPHER RONALD BERGE Diagnosing Phys: Julien Nordmann MD IMPRESSIONS  1. Left ventricular ejection fraction, by estimation, is 40 to 45%. The left ventricle has mildly decreased function. The left ventricle demonstrates global hypokinesis. There is moderate left ventricular hypertrophy. Left ventricular diastolic parameters are indeterminate.  2. Right ventricular systolic function is mildly reduced. The right ventricular size is normal. Tricuspid regurgitation signal is inadequate for assessing PA pressure.  3. Moderate pericardial effusion. There is no evidence of cardiac tamponade.  4. The mitral valve is normal in structure. No evidence of mitral valve regurgitation. No evidence of mitral stenosis.  5. The aortic valve is normal in structure. Aortic valve regurgitation is not visualized. No aortic stenosis is present.  6. The inferior vena cava is dilated in size with >50% respiratory variability, suggesting right atrial pressure of 8 mmHg. FINDINGS  Left Ventricle: Left ventricular ejection fraction, by estimation, is 40 to 45%. The left ventricle has mildly decreased function. The left ventricle demonstrates global hypokinesis. The left ventricular internal cavity size was normal in size. There is  moderate left ventricular hypertrophy. Left ventricular diastolic parameters are indeterminate. Right Ventricle: The right ventricular size is normal. No increase in right ventricular wall thickness. Right ventricular systolic function is mildly reduced. Tricuspid regurgitation signal is inadequate for assessing PA pressure. Left Atrium: Left atrial size was normal in size. Right Atrium: Right atrial size was normal in size. Pericardium: A moderately sized pericardial effusion is present. There is no evidence of cardiac tamponade. Mitral Valve: The mitral valve is normal in structure. No evidence of mitral valve stenosis. Tricuspid  Valve: The tricuspid valve is normal in structure. Tricuspid valve regurgitation is not demonstrated. No evidence of tricuspid stenosis. Aortic Valve: The aortic valve is normal in structure. Aortic valve regurgitation is not visualized. No aortic stenosis is present. Pulmonic Valve: The pulmonic valve was normal in structure. Pulmonic valve regurgitation is not visualized. No evidence of pulmonic stenosis. Aorta: The aortic root is normal in size and structure. Venous: The inferior vena cava is dilated in size with greater than 50% respiratory variability, suggesting right atrial pressure of 8  mmHg. IAS/Shunts: No atrial level shunt detected by color flow Doppler. LEFT VENTRICLE PLAX 2D LVIDd:         4.90 cm LVIDs:         4.10 cm LV PW:         1.30 cm LV IVS:        1.20 cm  IVC IVC diam: 2.50 cm LEFT ATRIUM         Index LA diam:    5.20 cm 2.31 cm/m   AORTA Ao Root diam: 3.80 cm Julien Nordmann MD Electronically signed by Julien Nordmann MD Signature Date/Time: 05/01/2023/11:13:46 AM    Final    DG Chest Port 1 View Result Date: 04/30/2023 CLINICAL DATA:  Status post thoracentesis EXAM: PORTABLE CHEST 1 VIEW COMPARISON:  CT chest dated April 30, 2023. Chest radiograph dated April 29, 2023. FINDINGS: Status post thoracentesis with moderate-to-large left pleural effusion, decreased in size compared to the prior exam. There is improved aeration of the left upper lung. Persistent left basilar atelectasis. Small to moderate-sized layering right pleural effusion with associated basilar atelectasis. No pneumothorax. Central pulmonary vascular congestion. Similar enlargement of the cardiac silhouette. Stable right chest Port-A-Cath in place. Visualized osseous structures are unchanged. IMPRESSION: 1. Status post thoracentesis with decreased size of a moderate-to-large left pleural effusion and improved aeration within the left upper lung. Left basilar atelectasis. 2. Small to moderate-sized layering right pleural  effusion with associated basilar atelectasis. 3. Central pulmonary vascular congestion. Electronically Signed   By: Hart Robinsons M.D.   On: 04/30/2023 12:10   US THORACENTESIS ASP PLEURAL SPACE W/IMG GUIDE Result Date: 04/30/2023 INDICATION: Patient with history of CHF, CKD3, cirrhosis, stage 4 gastric adenocarcinoma admitted for chest pain and dyspnea. Imaging shows new bilateral pleural effusions. Request for diagnostic and therapeutic thoracentesis. EXAM: ULTRASOUND GUIDED LEFT THORACENTESIS MEDICATIONS: 7 mL 1% lidocaine COMPLICATIONS: None immediate. PROCEDURE: An ultrasound guided thoracentesis was thoroughly discussed with the patient and questions answered. The benefits, risks, alternatives and complications were also discussed. The patient understands and wishes to proceed with the procedure. Written consent was obtained. Ultrasound was performed to localize and mark an adequate pocket of fluid in the left chest. The area was then prepped and draped in the normal sterile fashion. 1% Lidocaine was used for local anesthesia. Under ultrasound guidance a 6 Fr Safe-T-Centesis catheter was introduced. Thoracentesis was performed. The catheter was removed and a dressing applied. FINDINGS: A total of approximately 1.5 L of clear yellow fluid was removed. Samples were sent to the laboratory as requested by the clinical team. IMPRESSION: Successful ultrasound guided left thoracentesis yielding 1.5 L of pleural fluid. Performed by Lynnette Caffey, PA-C Electronically Signed   By: Malachy Moan M.D.   On: 04/30/2023 12:04   CT Angio Chest PE W and/or Wo Contrast Result Date: 04/30/2023 CLINICAL DATA:  53 year old male with history of shortness of breath and hypoxia. Left-sided chest pain. History of gastric cancer. * Tracking Code: BO * EXAM: CT ANGIOGRAPHY CHEST WITH CONTRAST TECHNIQUE: Multidetector CT imaging of the chest was performed using the standard protocol during bolus administration of  intravenous contrast. Multiplanar CT image reconstructions and MIPs were obtained to evaluate the vascular anatomy. RADIATION DOSE REDUCTION: This exam was performed according to the departmental dose-optimization program which includes automated exposure control, adjustment of the mA and/or kV according to patient size and/or use of iterative reconstruction technique. CONTRAST:  50mL OMNIPAQUE IOHEXOL 350 MG/ML SOLN COMPARISON:  CT of the chest, abdomen and pelvis  04/10/2023. FINDINGS: Cardiovascular: Study is slightly limited by suboptimal contrast bolus. With these limitations in mind, there is no central, lobar or segmental sized filling defect in the pulmonary arterial tree to indicate clinically relevant pulmonary embolism. Smaller subsegmental sized pulmonary emboli can not be entirely excluded. Heart size is normal. Moderate to large pericardial effusion. No definite pericardial calcification. There is aortic atherosclerosis, as well as atherosclerosis of the great vessels of the mediastinum and the coronary arteries, including calcified atherosclerotic plaque in the left anterior descending coronary artery. Mild calcifications of the aortic valve and mitral annulus. Right internal jugular single-lumen porta cath with tip terminating at the superior cavoatrial junction. Mediastinum/Nodes: No pathologically enlarged mediastinal or hilar lymph nodes. Esophagus is unremarkable in appearance. No axillary lymphadenopathy. Lungs/Pleura: Large bilateral pleural effusions (left-greater-than-right), predominantly lying dependently. Associated with these is extensive passive atelectasis in the lungs bilaterally. No definite consolidative airspace disease. Mild diffuse ground-glass attenuation and mild interlobular septal thickening in the lungs suggesting a background of mild interstitial pulmonary edema. Several small scattered pulmonary nodules are noted measuring 5 mm or less in size, similar to several prior  examinations dating back to 2023, favored to be benign. No larger more suspicious appearing pulmonary nodules or masses are noted. Upper Abdomen: Aortic atherosclerosis. Musculoskeletal: There are no aggressive appearing lytic or blastic lesions noted in the visualized portions of the skeleton. Review of the MIP images confirms the above findings. IMPRESSION: 1. No central, lobar or segmental sized pulmonary embolism. 2. Moderate to large pericardial effusion. 3. Large bilateral pleural effusions (left-greater-than-right) with extensive passive atelectasis in the lungs bilaterally. 4. Mild interstitial pulmonary edema. 5. Aortic atherosclerosis, in addition to left anterior descending coronary artery disease. 6. There are calcifications of the aortic valve and mitral annulus. Echocardiographic correlation for evaluation of potential valvular dysfunction may be warranted if clinically indicated. 7. Small pulmonary nodules measuring 5 mm or less in size, stable compared to prior examinations, favored to be benign (although continued attention on future follow-up imaging is recommended given the patient's history of gastric cancer). Aortic Atherosclerosis (ICD10-I70.0). Electronically Signed   By: Trudie Reed M.D.   On: 04/30/2023 07:24   DG Chest 2 View Result Date: 04/29/2023 CLINICAL DATA:  Chest pain EXAM: CHEST - 2 VIEW COMPARISON:  04/23/2023 FINDINGS: Persistent consolidation/atelectasis at the left lung base with probable adjacent effusion. Some increase in perihilar pulmonary vascular congestion suspected. Mild thickening of or fluid in the interlobar fissures as before. No pneumothorax. Stable right IJ port catheter. Heart size and mediastinal contours are within normal limits. Visualized bones unremarkable. IMPRESSION: 1. Persistent left basilar consolidation/atelectasis with probable adjacent effusion. 2. Increased perihilar pulmonary vascular congestion. Electronically Signed   By: Corlis Leak M.D.    On: 04/29/2023 17:24   ECHOCARDIOGRAM COMPLETE Result Date: 04/24/2023    ECHOCARDIOGRAM REPORT   Patient Name:   TINO RONAN Date of Exam: 04/24/2023 Medical Rec #:  [id number]       Height:       71.0 in Accession #:    [id number]      Weight:       245.0 lb Date of Birth:  Jul 05, 1970       BSA:          2.298 m Patient Age:    52 years        BP:           157/91 mmHg Patient Gender: M  HR:           90 bpm. Exam Location:  [facility] Procedure: 2D Echo, Cardiac Doppler and Color Doppler Indications:     CHF  History:         Patient has prior history of Echocardiogram examinations, most                  recent 11/22/2022. CHF, Stroke, Arrythmias:Atrial Fibrillation,                  Signs/Symptoms:Fatigue and Shortness of Breath; Risk                  Factors:Hypertension, Diabetes, Dyslipidemia and Sleep Apnea.                  CKD.  Sonographer:     Mikki Harbor Referring Phys:  [id number] Tresa Endo A GRIFFITH Diagnosing Phys: Julien Nordmann MD  Sonographer Comments: Patient is obese. IMPRESSIONS  1. Left ventricular ejection fraction, by estimation, is 40 to 45%. The left ventricle has mildly decreased function. The left ventricle demonstrates global hypokinesis. There is moderate left ventricular hypertrophy. Left ventricular diastolic parameters are indeterminate.  2. Right ventricular systolic function is mildly reduced. The right ventricular size is normal.  3. Left atrial size was moderately dilated.  4. A small pericardial effusion is present.  5. The mitral valve is normal in structure. Mild mitral valve regurgitation. No evidence of mitral stenosis.  6. The aortic valve is normal in structure. Aortic valve regurgitation is not visualized. No aortic stenosis is present.  7. There is mild dilatation of the aortic root, measuring 41 mm. There is mild dilatation of the ascending aorta, measuring 42 mm.  8. The inferior vena cava is normal in size with greater than 50% respiratory variability,  suggesting right atrial pressure of 3 mmHg. FINDINGS  Left Ventricle: Left ventricular ejection fraction, by estimation, is 40 to 45%. The left ventricle has mildly decreased function. The left ventricle demonstrates global hypokinesis. The left ventricular internal cavity size was normal in size. There is  moderate left ventricular hypertrophy. Left ventricular diastolic parameters are indeterminate. Right Ventricle: The right ventricular size is normal. No increase in right ventricular wall thickness. Right ventricular systolic function is mildly reduced. Left Atrium: Left atrial size was moderately dilated. Right Atrium: Right atrial size was normal in size. Pericardium: A small pericardial effusion is present. Mitral Valve: The mitral valve is normal in structure. Mild mitral annular calcification. Mild mitral valve regurgitation. No evidence of mitral valve stenosis. MV peak gradient, 4.4 mmHg. The mean mitral valve gradient is 2.0 mmHg. Tricuspid Valve: The tricuspid valve is normal in structure. Tricuspid valve regurgitation is not demonstrated. No evidence of tricuspid stenosis. Aortic Valve: The aortic valve is normal in structure. Aortic valve regurgitation is not visualized. No aortic stenosis is present. Aortic valve mean gradient measures 2.7 mmHg. Aortic valve peak gradient measures 5.0 mmHg. Aortic valve area, by VTI measures 3.00 cm. Pulmonic Valve: The pulmonic valve was normal in structure. Pulmonic valve regurgitation is not visualized. No evidence of pulmonic stenosis. Aorta: The aortic root is normal in size and structure. There is mild dilatation of the aortic root, measuring 41 mm. There is mild dilatation of the ascending aorta, measuring 42 mm. Venous: The inferior vena cava is normal in size with greater than 50% respiratory variability, suggesting right atrial pressure of 3 mmHg. IAS/Shunts: No atrial level shunt detected by color flow Doppler.  LEFT VENTRICLE PLAX 2D  LVIDd:         5.40  cm LVIDs:         2.70 cm LV PW:         1.50 cm LV IVS:        1.60 cm LVOT diam:     2.20 cm LV SV:         60 LV SV Index:   26 LVOT Area:     3.80 cm  RIGHT VENTRICLE RV Basal diam:  3.80 cm RV Mid diam:    3.70 cm LEFT ATRIUM             Index        RIGHT ATRIUM           Index LA diam:        4.90 cm 2.13 cm/m   RA Area:     20.10 cm LA Vol (A2C):   73.9 ml 32.15 ml/m  RA Volume:   57.10 ml  24.84 ml/m LA Vol (A4C):   97.4 ml 42.38 ml/m LA Biplane Vol: 87.3 ml 37.98 ml/m  AORTIC VALVE                    PULMONIC VALVE AV Area (Vmax):    3.17 cm     PV Vmax:       0.88 m/s AV Area (Vmean):   2.72 cm     PV Peak grad:  3.1 mmHg AV Area (VTI):     3.00 cm AV Vmax:           111.67 cm/s AV Vmean:          77.900 cm/s AV VTI:            0.200 m AV Peak Grad:      5.0 mmHg AV Mean Grad:      2.7 mmHg LVOT Vmax:         93.25 cm/s LVOT Vmean:        55.650 cm/s LVOT VTI:          0.158 m LVOT/AV VTI ratio: 0.79  AORTA Ao Root diam: 4.10 cm Ao Asc diam:  4.20 cm MITRAL VALVE MV Area (PHT): 3.65 cm    SHUNTS MV Area VTI:   2.63 cm    Systemic VTI:  0.16 m MV Peak grad:  4.4 mmHg    Systemic Diam: 2.20 cm MV Mean grad:  2.0 mmHg MV Vmax:       1.05 m/s MV Vmean:      58.7 cm/s MV Decel Time: 208 msec MV E velocity: 84.00 cm/s Julien Nordmann MD Electronically signed by Julien Nordmann MD Signature Date/Time: 04/24/2023/1:11:41 PM    Final    DG Chest 2 View Result Date: 04/23/2023 CLINICAL DATA:  Congestion, shortness of breath.  COPD EXAM: CHEST - 2 VIEW COMPARISON:  10/27/2022 FINDINGS: Right Port-A-Cath in place with the tip at the cavoatrial junction. Cardiomegaly with vascular congestion. Left pleural effusion with perihilar and lower lobe airspace opacities, left greater than right. No acute bony abnormality. IMPRESSION: Cardiomegaly with vascular congestion and bilateral perihilar opacities and lower lobe opacities, left greater than right. Favor asymmetric edema. Cannot exclude pneumonia. Small to  moderate left pleural effusion. Electronically Signed   By: Charlett Nose M.D.   On: 04/23/2023 17:40   CT CHEST ABDOMEN PELVIS WO CONTRAST Result Date: 04/15/2023 CLINICAL DATA:  Restaging gastric cancer EXAM: CT CHEST, ABDOMEN AND PELVIS WITHOUT CONTRAST TECHNIQUE: Multidetector CT  imaging of the chest, abdomen and pelvis was performed following the standard protocol without IV contrast. RADIATION DOSE REDUCTION: This exam was performed according to the departmental dose-optimization program which includes automated exposure control, adjustment of the mA and/or kV according to patient size and/or use of iterative reconstruction technique. COMPARISON:  PET-CT dated 12/31/2022 FINDINGS: CT CHEST FINDINGS Cardiovascular: Mild cardiomegaly.  Small pericardial effusion, new. No evidence of thoracic aortic aneurysm. Atherosclerotic calcifications of the aortic arch. Mild coronary atherosclerosis of the LAD and right coronary artery. Right chest port terminates at the cavoatrial junction. Mediastinum/Nodes: Dominant 12 mm short axis AP window node, similar. However, additional mediastinal nodes are favored to be mildly progressive, including a 2 mm short axis low right paratracheal node (series 2/image 23) and new prevascular nodes measuring up to 7 mm short axis (series 2/image 21). Visualized thyroid is unremarkable. Lungs/Pleura: 5 mm triangular subpleural nodule in the right lower lobe (series 4/image 109), unchanged, likely benign. Additional subpleural nodularity in the right upper and right middle lobes, measuring up to 5 mm, benign. No new/suspicious pulmonary nodules. Mild centrilobular emphysematous changes, upper lung predominant. Moderate left and small to moderate right pleural effusions, new. Associated lingular and left lower lobe compressive atelectasis. No pneumothorax. Musculoskeletal: Degenerative changes of the lower thoracic spine. CT ABDOMEN PELVIS FINDINGS Hepatobiliary: Unenhanced liver is  notable for a mildly macronodular contour but no focal hepatic lesion. Cholelithiasis, without associated inflammatory changes. No intrahepatic or extrahepatic duct dilatation. Pancreas: Within normal limits. Spleen: Within normal limits. Adrenals/Urinary Tract: Adrenal glands are within normal limits. Kidneys are within normal limits. Renal vascular calcifications. Mild left hydronephrosis with narrowing at the UVJ. Thick-walled bladder, although underdistended. 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 No evidence of bowel obstruction. Appendix is not discretely visualized. Sigmoid diverticulosis, without evidence of diverticulitis. Large complex midline lower abdominal ventral hernia containing multiple loops of nondilated bowel (series 2/image 106). Vascular/Lymphatic: No evidence of abdominal aortic aneurysm. Atherosclerotic calcifications of the abdominal aorta and branch vessels, although vessels remain patent. Perigastric soft tissue implant measuring 3.2 x 2.3 cm (series 2/image 53), previously 3.7 x 2.1 cm, favored to be mildly improved. Additional small periaortic/retroperitoneal nodes measuring up to 7 mm short axis (series 2/image 89), grossly unchanged. Reproductive: Prostate is unremarkable. Other: Trace pelvic ascites, new. Musculoskeletal: Degenerative changes of the lumbar spine. 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 Mediastinal, perigastric, and retroperitoneal nodal metastases, overall grossly unchanged, although possibly mildly progressive in the chest. Moderate left and small to moderate right pleural effusions, new. Small pericardial effusion, new. Trace pelvic ascites, new. Additional stable ancillary findings as above. Aortic Atherosclerosis  (ICD10-I70.0) and Emphysema (ICD10-J43.9). Electronically Signed   By: Charline Bills M.D.   On: 04/15/2023 00:24    Assessment and plan-   # Stage IV gastric cancer, status post first-line treatment with FOLFOX x 12 cycles currently on 5-FU maintenance, most recent restaging CT shows overall stable disease. CT abdomen pelvis showed indeterminate interval increased multi station periportal, retroaortic, and pelvic lymphadenopathy and proximal ureter mural thickening, suspicious for disease progression. Pleural effusion cytology negative for malignancy Long-term prognosis is poor as this is not a curable condition, although currently from oncology aspect, he may try other chemotherapy options if his current condition improves. Palliative service following.    # Pericardial effusion and bilateral pleural effusion. Pleural effusion cytology  negative for malignancy BNP is normal and cardiology felt pleural effusion is not due to CHF.  pericardial effusion need to be monitored and there is plan of repeat echo next week.  ? Fluid overload due to worse of renal function, nephrology was consulted for management.  He has a history of cirrhosis as well.    # AKI on CKD received IV contrast  Appreciate nephrology input.   I discussed with his aunt at bedside. Called sister to give her an update and not able to reach.  Thank you for allowing me to participate in the care of this patient.   Rickard Patience, MD, PhD Hematology Oncology 05/02/2023

## 2023-05-02 NOTE — Progress Notes (Addendum)
Rounding Note    Patient Name: Jeremiah Vance Date of Encounter: 05/02/2023  [facility] Cardiologist: Debbe Odea, MD   Subjective   Patient reports he is feeling about the same. AM labs pending. CTA yesterday showed persistent pleural effusions bilaterally. He is on IV lasix 60mg  daily and spiro. He was started on midodrine 10mg  TID. Bps are normal.   Inpatient Medications    Scheduled Med  apixaban  5 mg Oral BID   atorvastatin  40 mg Oral q1800   busPIRone  7.5 mg Oral BID   furosemide  60 mg Intravenous Daily   metoprolol tartrate  12.5 mg Oral BID   midodrine  10 mg Oral TID WC   sertraline  25 mg Oral Daily   sertraline  50 mg Oral Daily   spironolactone  50 mg Oral Daily   Continuous Infusions:  albumin human Stopped (05/01/23 1910)   PRN Meds: ondansetron, oxyCODONE   Vital Signs    Vitals:   05/01/23 2228 05/01/23 2230 05/02/23 0200 05/02/23 0600  BP: (!) 122/92 (!) 122/92 105/83 (!) 137/96  Pulse: 99 90 84 91  Resp:  (!) 23 (!) 27 (!) 23  Temp:  98.1 F (36.7 C) 98 F (36.7 C) 98.1 F (36.7 C)  TempSrc:  Oral Oral Oral  SpO2:  94% 96% 96%  Weight:      Height:        Intake/Output Summary (Last 24 hours) at 05/02/2023 0938 Last data filed at 05/01/2023 1910 Gross per 24 hour  Intake 50 ml  Output --  Net 50 ml      04/29/2023    4:59 PM 04/28/2023    2:18 AM 04/27/2023    5:00 AM  Last 3 Weights  Weight (lbs) 233 lb 11 oz 233 lb 11 oz 230 lb 6.1 oz  Weight (kg) 106 kg 106 kg 104.5 kg      Telemetry    Afib 90s - Personally Reviewed  ECG    No new - Personally Reviewed  Physical Exam   GEN: No acute distress.   Neck: No JVD Cardiac: RRR, no murmurs, rubs, or gallops.  Respiratory: diminished at bases bilaterally. GI: Soft, nontender, non-distended  MS: No edema; No deformity. Neuro:  Nonfocal  Psych: Normal affect   Labs    High Sensitivity Troponin:   Recent Labs  Lab 04/29/23 1655 04/29/23 2254   TROPONINIHS 55* 53*     Chemistry Recent Labs  Lab 04/27/23 0432 04/29/23 1655 05/01/23 0326  NA 138 137 135  K 3.7 4.4 4.8  CL 107 102 103  CO2 20* 20* 20*  GLUCOSE 105* 156* 114*  BUN 46* 54* 62*  CREATININE 2.11* 2.36* 2.84*  CALCIUM 8.7* 8.6* 8.6*  PROT  --   --  7.1  7.3  ALBUMIN  --   --  3.0*  AST  --   --  30  ALT  --   --  22  ALKPHOS  --   --  143*  BILITOT  --   --  0.9  GFRNONAA 37* 32* 26*  ANIONGAP 11 15 12     Lipids No results for input(s): "CHOL", "TRIG", "HDL", "LABVLDL", "LDLCALC", "CHOLHDL" in the last 168 hours.  Hematology Recent Labs  Lab 04/27/23 0432 04/29/23 1655 05/01/23 0326  WBC 4.6 5.9 7.6  RBC 4.00* 3.98* 3.88*  HGB 11.3* 11.5* 11.0*  HCT 34.7* 35.9* 35.3*  MCV 86.8 90.2 91.0  MCH 28.3 28.9  28.4  MCHC 32.6 32.0 31.2  RDW 17.2* 17.9* 17.8*  PLT 197 182 188   Thyroid No results for input(s): "TSH", "FREET4" in the last 168 hours.  BNP Recent Labs  Lab 05/01/23 0326  BNP 33.6    DDimer No results for input(s): "DDIMER" in the last 168 hours.   Radiology    CT ABDOMEN PELVIS WO CONTRAST Result Date: 05/01/2023 CLINICAL DATA:  History of gastric adenocarcinoma with nausea and vomiting. * Tracking Code: BO * EXAM: CT ABDOMEN AND PELVIS WITHOUT CONTRAST TECHNIQUE: Multidetector CT imaging of the abdomen and pelvis was performed following the standard protocol without IV contrast. RADIATION DOSE REDUCTION: This exam was performed according to the departmental dose-optimization program which includes automated exposure control, adjustment of the mA and/or kV according to patient size and/or use of iterative reconstruction technique. COMPARISON:  CT abdomen and pelvis dated 04/10/2023, CTA chest dated 04/30/2023, nuclear medicine PET dated 12/31/2022 FINDINGS: Lower chest: Partially imaged central venous catheter terminates at the superior cavoatrial junction. Re-expansion of partially imaged lingula and left lower lobe status post  thoracentesis. Diffuse patchy ground-glass opacities of the lingula and partially imaged left lower lobe. Similar large right and decreased moderate left pleural effusions. No pneumothorax. Similar pericardial effusion. Coronary artery calcifications. Hepatobiliary: No focal hepatic lesions. No intra or extrahepatic biliary ductal dilation. Contracted gallbladder demonstrates cholelithiasis. Adrenals Pancreas: No focal lesions or main ductal dilation. Spleen: Normal in size without focal abnormality. Adrenals/Urinary Tract: Unchanged 1.3 cm left adrenal nodule measures 13 HU, not previously FDG-avid, likely adenoma. No specific follow-up imaging recommended. No right adrenal nodule. Mild left hydronephrosis contains excreted renal contrast material from recent CTA chest. Contrast is seen to the level of the proximal ureter, which demonstrates mild circumferential mural thickening with adjacent soft tissue irregularity (2:52), inseparable from the anterior psoas. Excreted contrast material within the urinary bladder. Stomach/Bowel: Decreased conspicuity of mural thickening involving the gastric cardia. No evidence of bowel wall thickening, distention, or inflammatory changes. Normal appendix, which is contained within the hernia. Vascular/Lymphatic: Aortic atherosclerosis. Partially imaged paraesophageal lymph nodes measuring up to 9 mm (2:2) are unchanged. Continued decrease in size of perigastric node measuring 2.2 x 2.0 cm (2:34), previously 3.7 x 2.1 cm. Interval increased multi station periportal, retroaortic, and pelvic lymphadenopathy, for example -14 mm portacaval (2:33), previously 13 mm -10 mm left para-aortic (2:39), previously 5 mm -14 mm right external iliac (2:74), previously 12 mm Reproductive: Prostate is unremarkable. Other: Trace ascites.  No free air or fluid collection. Musculoskeletal: No acute or abnormal lytic or blastic osseous lesions. Multilevel degenerative changes of the partially imaged  thoracic and lumbar spine. Large ventral midline lower abdominal wall hernia containing nonobstructed loops of small and large bowel, as before. IMPRESSION: 1. Mild left hydronephrosis contains excreted renal contrast material from recent CTA chest, suggestive of delayed nephrogram. Contrast is seen to the level of the proximal ureter, which demonstrates mild circumferential mural thickening with adjacent soft tissue irregularity, inseparable from the anterior psoas. Findings are suspicious for ureteral involvement by metastatic disease, less likely ascending urinary tract infection. Recommend correlation with urinalysis. 2. Interval increased multi station periportal, retroaortic, and pelvic lymphadenopathy, indeterminate, however suspicious for metastatic disease. 3. Decreased conspicuity of mural thickening involving the gastric cardia. Decreased size of perigastric nodule. 4. Re-expansion of partially imaged lingula and left lower lobe status post thoracentesis. Diffuse patchy ground-glass opacities of the lingula and partially imaged left lower lobe, which may represent re-expansion pulmonary edema. 5. Similar large  right and decreased moderate left pleural effusions. 6. Similar pericardial effusion. 7. Aortic Atherosclerosis (ICD10-I70.0). Coronary artery calcifications. Assessment for potential risk factor modification, dietary therapy or pharmacologic therapy may be warranted, if clinically indicated. Electronically Signed   By: Agustin Cree M.D.   On: 05/01/2023 15:51   ECHOCARDIOGRAM LIMITED Result Date: 05/01/2023    ECHOCARDIOGRAM LIMITED REPORT   Patient Name:   Jeremiah Vance Date of Exam: 04/30/2023 Medical Rec #:  [id number]       Height:       71.0 in Accession #:    [id number]      Weight:       233.7 lb Date of Birth:  1970/06/07       BSA:          2.253 m Patient Age:    52 years        BP:           127/89 mmHg Patient Gender: M               HR:           95 bpm. Exam Location:  [facility] Procedure:  2D Echo and Limited Echo Indications:     I31.3 Pericardial Effusion  History:         Patient has prior history of Echocardiogram examinations, most                  recent 04/24/2023. Cardiomyopathy, Stroke, Arrythmias:Atrial                  Fibrillation; Risk Factors:Hypertension and Diabetes.  Sonographer:     Sedonia Small Rodgers-Jones RDCS Referring Phys:  [id number] CHRISTOPHER RONALD BERGE Diagnosing Phys: Julien Nordmann MD IMPRESSIONS  1. Left ventricular ejection fraction, by estimation, is 40 to 45%. The left ventricle has mildly decreased function. The left ventricle demonstrates global hypokinesis. There is moderate left ventricular hypertrophy. Left ventricular diastolic parameters are indeterminate.  2. Right ventricular systolic function is mildly reduced. The right ventricular size is normal. Tricuspid regurgitation signal is inadequate for assessing PA pressure.  3. Moderate pericardial effusion. There is no evidence of cardiac tamponade.  4. The mitral valve is normal in structure. No evidence of mitral valve regurgitation. No evidence of mitral stenosis.  5. The aortic valve is normal in structure. Aortic valve regurgitation is not visualized. No aortic stenosis is present.  6. The inferior vena cava is dilated in size with >50% respiratory variability, suggesting right atrial pressure of 8 mmHg. FINDINGS  Left Ventricle: Left ventricular ejection fraction, by estimation, is 40 to 45%. The left ventricle has mildly decreased function. The left ventricle demonstrates global hypokinesis. The left ventricular internal cavity size was normal in size. There is  moderate left ventricular hypertrophy. Left ventricular diastolic parameters are indeterminate. Right Ventricle: The right ventricular size is normal. No increase in right ventricular wall thickness. Right ventricular systolic function is mildly reduced. Tricuspid regurgitation signal is inadequate for assessing PA pressure. Left Atrium: Left atrial size  was normal in size. Right Atrium: Right atrial size was normal in size. Pericardium: A moderately sized pericardial effusion is present. There is no evidence of cardiac tamponade. Mitral Valve: The mitral valve is normal in structure. No evidence of mitral valve stenosis. Tricuspid Valve: The tricuspid valve is normal in structure. Tricuspid valve regurgitation is not demonstrated. No evidence of tricuspid stenosis. Aortic Valve: The aortic valve is normal in structure. Aortic valve regurgitation is not visualized. No aortic stenosis  is present. Pulmonic Valve: The pulmonic valve was normal in structure. Pulmonic valve regurgitation is not visualized. No evidence of pulmonic stenosis. Aorta: The aortic root is normal in size and structure. Venous: The inferior vena cava is dilated in size with greater than 50% respiratory variability, suggesting right atrial pressure of 8 mmHg. IAS/Shunts: No atrial level shunt detected by color flow Doppler. LEFT VENTRICLE PLAX 2D LVIDd:         4.90 cm LVIDs:         4.10 cm LV PW:         1.30 cm LV IVS:        1.20 cm  IVC IVC diam: 2.50 cm LEFT ATRIUM         Index LA diam:    5.20 cm 2.31 cm/m   AORTA Ao Root diam: 3.80 cm Julien Nordmann MD Electronically signed by Julien Nordmann MD Signature Date/Time: 05/01/2023/11:13:46 AM    Final    DG Chest Port 1 View Result Date: 04/30/2023 CLINICAL DATA:  Status post thoracentesis EXAM: PORTABLE CHEST 1 VIEW COMPARISON:  CT chest dated April 30, 2023. Chest radiograph dated April 29, 2023. FINDINGS: Status post thoracentesis with moderate-to-large left pleural effusion, decreased in size compared to the prior exam. There is improved aeration of the left upper lung. Persistent left basilar atelectasis. Small to moderate-sized layering right pleural effusion with associated basilar atelectasis. No pneumothorax. Central pulmonary vascular congestion. Similar enlargement of the cardiac silhouette. Stable right chest Port-A-Cath in  place. Visualized osseous structures are unchanged. IMPRESSION: 1. Status post thoracentesis with decreased size of a moderate-to-large left pleural effusion and improved aeration within the left upper lung. Left basilar atelectasis. 2. Small to moderate-sized layering right pleural effusion with associated basilar atelectasis. 3. Central pulmonary vascular congestion. Electronically Signed   By: Hart Robinsons M.D.   On: 04/30/2023 12:10   US THORACENTESIS ASP PLEURAL SPACE W/IMG GUIDE Result Date: 04/30/2023 INDICATION: Patient with history of CHF, CKD3, cirrhosis, stage 4 gastric adenocarcinoma admitted for chest pain and dyspnea. Imaging shows new bilateral pleural effusions. Request for diagnostic and therapeutic thoracentesis. EXAM: ULTRASOUND GUIDED LEFT THORACENTESIS MEDICATIONS: 7 mL 1% lidocaine COMPLICATIONS: None immediate. PROCEDURE: An ultrasound guided thoracentesis was thoroughly discussed with the patient and questions answered. The benefits, risks, alternatives and complications were also discussed. The patient understands and wishes to proceed with the procedure. Written consent was obtained. Ultrasound was performed to localize and mark an adequate pocket of fluid in the left chest. The area was then prepped and draped in the normal sterile fashion. 1% Lidocaine was used for local anesthesia. Under ultrasound guidance a 6 Fr Safe-T-Centesis catheter was introduced. Thoracentesis was performed. The catheter was removed and a dressing applied. FINDINGS: A total of approximately 1.5 L of clear yellow fluid was removed. Samples were sent to the laboratory as requested by the clinical team. IMPRESSION: Successful ultrasound guided left thoracentesis yielding 1.5 L of pleural fluid. Performed by Lynnette Caffey, PA-C Electronically Signed   By: Malachy Moan M.D.   On: 04/30/2023 12:04    Cardiac Studies    Limited echo 04/30/23 1. Left ventricular ejection fraction, by estimation, is  40 to 45%. The  left ventricle has mildly decreased function. The left ventricle  demonstrates global hypokinesis. There is moderate left ventricular  hypertrophy. Left ventricular diastolic  parameters are indeterminate.   2. Right ventricular systolic function is mildly reduced. The right  ventricular size is normal. Tricuspid regurgitation signal is inadequate  for assessing PA pressure.   3. Moderate pericardial effusion. There is no evidence of cardiac  tamponade.   4. The mitral valve is normal in structure. No evidence of mitral valve  regurgitation. No evidence of mitral stenosis.   5. The aortic valve is normal in structure. Aortic valve regurgitation is  not visualized. No aortic stenosis is present.   6. The inferior vena cava is dilated in size with >50% respiratory  variability, suggesting right atrial pressure of 8 mmHg.   Echo 04/2023 1. Left ventricular ejection fraction, by estimation, is 40 to 45%. The  left ventricle has mildly decreased function. The left ventricle  demonstrates global hypokinesis. There is moderate left ventricular  hypertrophy. Left ventricular diastolic  parameters are indeterminate.   2. Right ventricular systolic function is mildly reduced. The right  ventricular size is normal.   3. Left atrial size was moderately dilated.   4. A small pericardial effusion is present.   5. The mitral valve is normal in structure. Mild mitral valve  regurgitation. No evidence of mitral stenosis.   6. The aortic valve is normal in structure. Aortic valve regurgitation is  not visualized. No aortic stenosis is present.   7. There is mild dilatation of the aortic root, measuring 41 mm. There is  mild dilatation of the ascending aorta, measuring 42 mm.   8. The inferior vena cava is normal in size with greater than 50%  respiratory variability, suggesting right atrial pressure of 3 mmHg.      Echo 10/2022 1. Left ventricular ejection fraction, by estimation,  is 55 to 60%. The  left ventricle has normal function. The left ventricle has no regional  wall motion abnormalities. There is mild left ventricular hypertrophy.  Left ventricular diastolic parameters  are indeterminate.   2. Right ventricular systolic function is low normal. The right  ventricular size is mildly enlarged. There is normal pulmonary artery  systolic pressure.   3. Left atrial size was mildly dilated.   4. The mitral valve is normal in structure. Mild mitral valve  regurgitation.   5. The aortic valve is tricuspid. Aortic valve regurgitation is not  visualized.   6. Aortic dilatation noted. There is borderline dilatation of the aortic  root, measuring 38 mm.   7. The inferior vena cava is dilated in size with <50% respiratory  variability, suggesting right atrial pressure of 15 mmHg.   Patient Profile     53 y.o. 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   Assessment & Plan    Acute on chronic respiratory failure Pleural effusion - s/p thoracentesis -1.5 L on 1/29 -Suspected pleural effusion multifactorial from metastatic cancer, A-fib, diastolic heart failure, renal failure, cirrhosis -Patient is requiring 2 L O2 -Patient was given 1 dose of IV Lasix in the ER resulting in increased Scr/BUN 2.84/62 - CT abd/pelvis 1/30 showed similar large right and decreased left pleural effusions - started on IV lasix 60mg  daily, spironolactone 50mg  daily , midodrine and albumin by pulmonology. BMET pending - pulmonology is following   Pericardial effusion - echo 1/23 showed small pericardial effusion -CT scan was concerning for moderate to severe pericardial effusion -Repeat limited echo 1/29 showed LVEF  40-45%, moderate pericardial effusion, no tamponade -  we will continue to monitor this. BP was low and he was started on midodrine today. HR in the 90s   Elevated troponin -The setting of respiratory distress and other acute issues -Suspect supply demand mismatch -No aspirin given Eliquis - no plan for ischemic work-up at this time   Nonischemic cardiomyopathy -echo 1/23  EF of 40 to 45% with moderate LVH - Echo 10/2022 LVEF 55-60%, no WMA mild LVH - Echo 04/30/23 showed LVEF 40-45%, mildly reduced RVSF, moderate pericardial effusion -now on IV lasix 60mg  daily -Appears euvolemic -PTA Farxiga, losartan, metoprolol. Farxiga and losartan held on admission - PTA torsemide 10mg  PRN swelling   Permanent Afib - in rate controlled Afib - continue Eliquis for stroke ppx - continue BB for rate control  For questions or updates, please contact  [facility] Please consult [email or web address] for contact info under        Signed, Carolene Gitto David Stall, PA-C  05/02/2023, 9:38 AM

## 2023-05-02 NOTE — TOC Initial Note (Signed)
Transition of Care [facility]) - Initial/Assessment Note    Patient Details  Name: Jeremiah Vance MRN: [id number] Date of Birth: 01/31/1971  [facility] CM/SW Contact:    Liliana Cline, LCSW Phone Number: 05/02/2023, 8:51 AM  Clinical Narrative:                 CSW completed chart review. Patient was recently discharged home with sister on 1/27.  TOC will follow for needs during this admission.  Expected Discharge Plan: Home/Self Care Barriers to Discharge: Continued Medical Work up   Patient Goals and CMS Choice            Expected Discharge Plan and Services       Living arrangements for the past 2 months: Single Family Home                                      Prior Living Arrangements/Services Living arrangements for the past 2 months: Single Family Home Lives with:: Siblings                   Activities of Daily Living   ADL Screening (condition at time of admission) Independently performs ADLs?: No Does the patient have a NEW difficulty with bathing/dressing/toileting/self-feeding that is expected to last >3 days?: No Does the patient have a NEW difficulty with getting in/out of bed, walking, or climbing stairs that is expected to last >3 days?: No Does the patient have a NEW difficulty with communication that is expected to last >3 days?: No Is the patient deaf or have difficulty hearing?: No Does the patient have difficulty seeing, even when wearing glasses/contacts?: Yes Does the patient have difficulty concentrating, remembering, or making decisions?: No  Permission Sought/Granted                  Emotional Assessment              Admission diagnosis:  Pleural effusion [J90] Patient Active Problem List   Diagnosis Date Noted   Pleural effusion 04/30/2023   Status post thoracentesis 04/30/2023   Palliative care encounter 04/30/2023   Elevated troponin 04/30/2023   Chest pain 04/30/2023   Permanent atrial  fibrillation (HCC) 04/24/2023   SOB (shortness of breath) 04/23/2023   Clavicular fracture 01/07/2023   Ischemic cerebrovascular accident (CVA) of frontal lobe (HCC) 11/27/2022   PAF (paroxysmal atrial fibrillation) (HCC) 11/27/2022   Diabetes mellitus (HCC) 11/27/2022   Class 1 obesity with serious comorbidity in adult 11/27/2022   Benign essential HTN 11/27/2022   Acute CVA (cerebrovascular accident) (HCC) 11/22/2022   Stroke (HCC) 11/21/2022   Depression with anxiety 11/21/2022   Obesity (BMI 30-39.9) 11/21/2022   Hypomagnesemia 11/21/2022   Mood disorder (HCC) 10/29/2022   Type 2 diabetes mellitus with hyperglycemia, without long-term current use of insulin (HCC) 10/29/2022   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   Encounter for antineoplastic chemotherapy 10/02/2022   Acute on chronic combined systolic and diastolic CHF (congestive heart failure) (HCC) 09/27/2022   Chronic kidney disease, stage 3b (HCC) 09/27/2022   Type II diabetes mellitus with renal manifestations (HCC) 09/27/2022   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   Goals of care, counseling/discussion 09/19/2022   Anxiety associated with cancer diagnosis (HCC) 09/19/2022   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   IDA (iron deficiency anemia) 09/09/2022   Paroxysmal atrial fibrillation (HCC) 09/08/2022   Dysarthria 05/20/2022   Dyslipidemia  01/16/2022   Acute on chronic diastolic CHF (congestive heart failure) (HCC) 01/16/2022   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    HFrEF (heart failure with reduced ejection fraction) (HCC)    Cough    Other cirrhosis of liver (HCC)    History of CVA (cerebrovascular accident) 10/17/2019   CKD (chronic kidney disease) stage 3, GFR 30-59 ml/min (HCC) 08/31/2019   DM (diabetes mellitus), type 2 with complications (HCC) 07/08/2019   Moderate obstructive sleep apnea 02/11/2018   Chronic fatigue 12/16/2017   Essential hypertension 12/16/2017    History of small bowel obstruction 10/24/2017   AKI (acute kidney injury) (HCC)    Hyperlipidemia    Gout    PCP:  Debera Lat, PA-C Pharmacy:   [id number] [place], [place] - [address] AT [facility] OF [address] [place] [address] Phone: [phone] Fax: [phone]  [facility] [address] [place] [address] Phone: [phone] Fax: [phone]  [facility] [address] [place] [address] Phone: [phone] Fax: [phone]     Social Drivers of Health (SDOH) Social History: SDOH Screenings   Food Insecurity: No Food Insecurity (04/30/2023)  Housing: Low Risk  (04/30/2023)  Transportation Needs: No Transportation Needs (04/30/2023)  Utilities: Not At Risk (04/30/2023)  Alcohol Screen: Low Risk  (11/28/2021)  Depression (PHQ2-9): Medium Risk (02/20/2023)  Financial Resource Strain: Low Risk  (04/24/2023)  Social Connections: Moderately Isolated (04/30/2023)  Tobacco Use: Low Risk  (04/30/2023)   SDOH Interventions:     Readmission Risk Interventions    09/29/2022   10:04 AM  Readmission Risk Prevention Plan  Transportation Screening Complete  Medication Review (RN Care Manager) Complete  PCP or Specialist appointment within 3-5 days of discharge Complete  HRI or Home Care Consult Complete  SW Recovery Care/Counseling Consult Complete  Palliative Care Screening Not Applicable  Skilled Nursing Facility Not Applicable

## 2023-05-02 NOTE — Progress Notes (Signed)
PULMONOLOGY         Date: 05/02/2023,   MRN# [id number] Jeremiah Vance October 15, 1970     AdmissionWeight: 106 kg                 CurrentWeight: 106 kg   CHIEF COMPLAINT:   Recurrent pleural effusion and anasarca   HISTORY OF PRESENT ILLNESS   This is a 53 y.o. male with medical history significant of GE junction tage 4 gastric adenocarcinoma, osa, cirrhosis, on chemotherapy, HTN, HLD, DM, dCHF,  stroke with mild left sided weakness, depression with anxiety, A-fib on Eliquis, obesity, who presents with SOB and anasarca. He was dcd with similar issues few days ago. He is reporting chest discomfort. He is noted to have severe LE edema, gut edema and decreased breath sounds. He had CT chest with findings of b/l pleural effusions and pericardial effusions.  He had thoracentesis done. PCCM consultation for further evaluation and management.    05/02/23- patient remains in anasarca. I discussed case with onclologist Dr Cathie Hoops.  The pleural fluid studies and cytology do not suggest malignant pleural effusion.  Monocyte predominant clear yellow fluid was extracted from pleural space which is not consistent with classic findings of malignant effusion.  Further this patient has anasarca and would benefit from aggressive fluid removal. I think his liver dysfunction and renal failure are likely culprits.  He does have CHF but appears to have normal cardiac biomarkers at this time.  He would benefit from renal evaluation for fluid management.   PAST MEDICAL HISTORY   Past Medical History:  Diagnosis Date   Acute ischemic stroke (HCC) 09/06/2017   Acute renal failure superimposed on stage 3a chronic kidney disease (HCC) 07/08/2019   Acute right-sided weakness    Allergies    Anasarca    Anemia 07/10/2017   Arthritis    Cancer (HCC)    stomach and esophageal cancer stage 4   Chicken pox    CKD (chronic kidney disease) stage 3, GFR 30-59 ml/min (HCC) 06/2017   Depression    Elevated  troponin 07/08/2019   Heart failure with mid-range ejection fraction (HCC)    a. 06/2017 Echo: EF 40-45%; b. 07/2019 Echo: EF 50-55%; c. 10/2021 Echo: EF 30-35%; d. 10/2022 Echo: EF 55-60%; e. 04/2023 Echo: EF 40-45%, glob HK, mod LVH, mildly reduced RV fxn, mod dil LA, mild MR, Ao root 41mm, asc Ao 42mm.   High cholesterol    History of cardiomyopathy    LVEF 40 to 45% in April 2019 - subsequently normalized   Hyperbilirubinemia 07/10/2017   Hypertension    Ischemic stroke (HCC)    Small left internal capsule infarct due to lacunar disease   Morbid obesity (HCC)    NICM (nonischemic cardiomyopathy) (HCC)    a. 06/2017 Echo: EF 40-45%; b. 07/2019 Echo: EF 50-55%; c. 10/2021 Echo: EF 30-35%; d. 12/2021 MV: EF 60%, no ischemia/scar; e. 10/2022 Echo: EF 55-60%; f. 04/2023 Echo: EF 40-45%.   Normocytic anemia 12/16/2017   Permanent atrial fibrillation (HCC)    a. CHA2DS2VASc = 5-->eliquis.   Recurrent incisional hernia with incarceration s/p repair 10/22/2017 10/21/2017   Stroke (HCC) 08/2017   "right sided weakness since; getting stronger though" (10/22/2017)   Type 2 diabetes mellitus [facility])      SURGICAL HISTORY   Past Surgical History:  Procedure Laterality Date   ABDOMINAL HERNIA REPAIR  2008; 10/22/2017   "scope; OPEN REPAIR INCARCERATED VENTRAL HERNIA   ESOPHAGOGASTRODUODENOSCOPY (EGD) WITH PROPOFOL  N/A 09/10/2022   Procedure: ESOPHAGOGASTRODUODENOSCOPY (EGD) WITH PROPOFOL;  Surgeon: Toney Reil, MD;  Location: [facility] ENDOSCOPY;  Service: Gastroenterology;  Laterality: N/A;   ESOPHAGOGASTRODUODENOSCOPY (EGD) WITH PROPOFOL N/A 09/28/2022   Procedure: ESOPHAGOGASTRODUODENOSCOPY (EGD) WITH PROPOFOL;  Surgeon: Jaynie Collins, DO;  Location: [facility] ENDOSCOPY;  Service: Gastroenterology;  Laterality: N/A;   HEMOSTASIS CONTROL  09/28/2022   Procedure: HEMOSTASIS CONTROL;  Surgeon: Jaynie Collins, DO;  Location: [facility] ENDOSCOPY;  Service: Gastroenterology;;   HERNIA REPAIR     KNEE  ARTHROSCOPY Right 1989   PORTA CATH INSERTION N/A 09/25/2022   Procedure: PORTA CATH INSERTION;  Surgeon: Annice Needy, MD;  Location: [facility] INVASIVE CV LAB;  Service: Cardiovascular;  Laterality: N/A;   VENTRAL HERNIA REPAIR N/A 10/22/2017   Procedure: OPEN REPAIR INCARCERATED VENTRAL HERNIA;  Surgeon: Glenna Fellows, MD;  Location: [facility] OR;  Service: General;  Laterality: N/A;     FAMILY HISTORY   Family History  Problem Relation Age of Onset   Diabetes Mother    Stroke Mother    Arthritis Mother    Depression Mother    Heart disease Mother    Hypertension Mother    Learning disabilities Mother    Mental illness Mother    Sleep apnea Mother    Diabetes Father    Heart disease Father    Arthritis Father    Hearing loss Father    Hyperlipidemia Father    Heart attack Father    Hypertension Father    Stroke Father    Diabetes Sister    Depression Sister    Diabetes Sister    Hypertension Sister    Mental illness Sister    Sleep apnea Sister    Diabetes Maternal Grandmother    Heart disease Maternal Grandmother    Depression Maternal Grandmother    Hyperlipidemia Maternal Grandmother    Hypertension Maternal Grandmother    Stroke Maternal Grandmother    Hypertension Maternal Grandfather    Hyperlipidemia Maternal Grandfather    Stroke Maternal Grandfather    Arthritis Paternal Grandmother    Hearing loss Paternal Grandmother    Stroke Paternal Grandfather    Heart disease Paternal Grandfather    Arthritis Paternal Grandfather    Heart attack Paternal Grandfather    Melanoma Other      SOCIAL HISTORY   Social History   Tobacco Use   Smoking status: Never   Smokeless tobacco: Never  Vaping Use   Vaping status: Never Used  Substance Use Topics   Alcohol use: Yes    Comment: occasional beer   Drug use: Never     MEDICATIONS    Home Medication:  Current Outpatient Rx   Order #: [id number] Class: OTC   Order #: [id number] Class: Normal   Order #:  [id number] Class: Normal   Order #: [id number] Class: Normal   Order #: [id number] Class: No Print   Order #: [id number] Class: Normal   Order #: [id number] Class: Normal   Order #: [id number] Class: Normal   Order #: [id number] Class: Normal   Order #: [id number] Class: Normal   Order #: [id number] Class: Normal   Order #: [id number] Class: Normal   Order #: [id number] Class: Normal   Order #: [id number] Class: Normal   Order #: [id number] Class: Normal   Order #: [id number] Class: Normal   Order #: [id number] Class: Normal   Order #: [id number] Class: OTC   Order #: [id number] Class: Normal    Current Medication:  Current Facility-Administered Medications:    albumin human 25 % solution 12.5 g, 12.5 g, Intravenous, Daily, Aysa Larivee,  Luis Abed, MD, Stopped at 05/01/23 1910   apixaban (ELIQUIS) tablet 5 mg, 5 mg, Oral, BID, Mila Merry A, RPH, 5 mg at 05/01/23 2228   atorvastatin (LIPITOR) tablet 40 mg, 40 mg, Oral, q1800, Wouk, Wilfred Curtis, MD, 40 mg at 05/01/23 1820   busPIRone (BUSPAR) tablet 7.5 mg, 7.5 mg, Oral, BID, Wouk, Wilfred Curtis, MD, 7.5 mg at 05/01/23 2227   furosemide (LASIX) injection 60 mg, 60 mg, Intravenous, Daily, Vida Rigger, MD, 60 mg at 05/01/23 1820   metoprolol tartrate (LOPRESSOR) tablet 12.5 mg, 12.5 mg, Oral, BID, Wouk, Wilfred Curtis, MD, 12.5 mg at 05/01/23 2228   midodrine (PROAMATINE) tablet 10 mg, 10 mg, Oral, TID WC, Vida Rigger, MD, 10 mg at 05/02/23 0835   ondansetron (ZOFRAN-ODT) disintegrating tablet 4 mg, 4 mg, Oral, Q8H PRN, Wouk, Wilfred Curtis, MD, 4 mg at 05/01/23 1244   oxyCODONE (Oxy IR/ROXICODONE) immediate release tablet 5 mg, 5 mg, Oral, Q6H PRN, Wouk, Wilfred Curtis, MD   sertraline (ZOLOFT) tablet 25 mg, 25 mg, Oral, Daily, Wouk, Wilfred Curtis, MD, 25 mg at 05/01/23 0948   sertraline (ZOLOFT) tablet 50 mg, 50 mg, Oral, Daily, Wouk, Wilfred Curtis, MD, 50 mg at 05/01/23 3664   spironolactone (ALDACTONE) tablet 50 mg, 50 mg, Oral, Daily, Vida Rigger, MD, 50 mg at  05/01/23 1820  Current Outpatient Medications:    acetaminophen (TYLENOL) 325 MG tablet, Take 2 tablets (650 mg total) by mouth every 4 (four) hours as needed for mild pain (or temp > 37.5 C (99.5 F))., Disp: , Rfl:    apixaban (ELIQUIS) 5 MG TABS tablet, Take 1 tablet (5 mg total) by mouth 2 (two) times daily., Disp: 60 tablet, Rfl: 1   busPIRone (BUSPAR) 7.5 MG tablet, Take 1 tablet (7.5 mg total) by mouth 2 (two) times daily., Disp: 180 tablet, Rfl: 1   dapagliflozin propanediol (FARXIGA) 10 MG TABS tablet, Take 1 tablet (10 mg total) by mouth daily., Disp: 30 tablet, Rfl: 0   diclofenac Sodium (VOLTAREN) 1 % GEL, Apply 4 g topically 4 (four) times daily., Disp: , Rfl:    losartan (COZAAR) 25 MG tablet, Take 1 tablet (25 mg total) by mouth daily., Disp: 30 tablet, Rfl: 1   metoprolol tartrate (LOPRESSOR) 25 MG tablet, Take 0.5 tablets (12.5 mg total) by mouth 2 (two) times daily., Disp: 60 tablet, Rfl: 1   ondansetron (ZOFRAN-ODT) 8 MG disintegrating tablet, Take 1 tablet (8 mg total) by mouth every 8 (eight) hours as needed for nausea or vomiting., Disp: 60 tablet, Rfl: 1   pantoprazole (PROTONIX) 40 MG tablet, Take 1 tablet (40 mg total) by mouth 2 (two) times daily before a meal., Disp: 60 tablet, Rfl: 3   prochlorperazine (COMPAZINE) 10 MG tablet, Take 1 tablet (10 mg total) by mouth every 6 (six) hours as needed for nausea or vomiting., Disp: 60 tablet, Rfl: 0   sertraline (ZOLOFT) 25 MG tablet, Take 1 tablet (25 mg total) by mouth daily. (Patient taking differently: Take 25 mg by mouth daily. Take along with one 50 mg tablet for total 75 mg once daily), Disp: 30 tablet, Rfl: 3   sertraline (ZOLOFT) 50 MG tablet, Take 1 tablet (50 mg total) by mouth daily., Disp: 30 tablet, Rfl: 3   torsemide (DEMADEX) 10 MG tablet, Take 1 tablet (10 mg total) by mouth daily as needed (lower ext swelling)., Disp: 30 tablet, Rfl: 0   atorvastatin (LIPITOR) 40 MG tablet, Take 1 tablet (40 mg total) by mouth  daily  at 6 PM., Disp: 90 tablet, Rfl: 3   Continuous Glucose Sensor (FREESTYLE LIBRE 2 SENSOR) MISC, Place 1 sensor on the skin every 14 days. Use to check glucose continuously, Disp: 2 each, Rfl: 11   Continuous Glucose Sensor (FREESTYLE LIBRE 3 SENSOR) MISC, Place 1 sensor on the skin every 14 days. Use to check glucose continuously, Disp: 2 each, Rfl: 11   dexamethasone (DECADRON) 4 MG tablet, TAKE 2 TABLETS(8 MG) BY MOUTH DAILY. START THE DAY AFTER CHEMOTHERAPY FOR 2 DAYS. TAKE WITH FOOD, Disp: 30 tablet, Rfl: 1   feeding supplement (ENSURE ENLIVE / ENSURE PLUS) LIQD, Take 237 mLs by mouth 2 (two) times daily between meals., Disp: , Rfl:    oxyCODONE (OXY IR/ROXICODONE) 5 MG immediate release tablet, Take 1 tablet (5 mg total) by mouth every 6 (six) hours as needed for severe pain (pain score 7-10). (Patient not taking: Reported on 04/30/2023), Disp: 60 tablet, Rfl: 0    ALLERGIES   Pollen extract and Sulfa antibiotics     REVIEW OF SYSTEMS    Review of Systems:  Gen:  Denies  fever, sweats, chills weigh loss  HEENT: Denies blurred vision, double vision, ear pain, eye pain, hearing loss, nose bleeds, sore throat Cardiac:  No dizziness, chest pain or heaviness, chest tightness,edema Resp:   reports dyspnea chronically  Gi: Denies swallowing difficulty, stomach pain, nausea or vomiting, diarrhea, constipation, bowel incontinence Gu:  Denies bladder incontinence, burning urine Ext:   Denies Joint pain, stiffness or swelling Skin: Denies  skin rash, easy bruising or bleeding or hives Endoc:  Denies polyuria, polydipsia , polyphagia or weight change Psych:   Denies depression, insomnia or hallucinations   Other:  All other systems negative   VS: BP (!) 137/96   Pulse 91   Temp 98.1 F (36.7 C) (Oral)   Resp (!) 23   Ht 5\' 11"  (1.803 m)   Wt 106 kg   SpO2 96%   BMI 32.59 kg/m      PHYSICAL EXAM    GENERAL:NAD, no fevers, chills, no weakness no fatigue HEAD:  Normocephalic, atraumatic.  EYES: Pupils equal, round, reactive to light. Extraocular muscles intact. No scleral icterus.  MOUTH: Moist mucosal membrane. Dentition intact. No abscess noted.  EAR, NOSE, THROAT: Clear without exudates. No external lesions.  NECK: Supple. No thyromegaly. No nodules. No JVD.  PULMONARY: decreased breath sounds with mild rhonchi worse at bases bilaterally.  CARDIOVASCULAR: S1 and S2. Regular rate and rhythm. No murmurs, rubs, or gallops. No edema. Pedal pulses 2+ bilaterally.  GASTROINTESTINAL: Soft, nontender, nondistended. No masses. Positive bowel sounds. No hepatosplenomegaly.  MUSCULOSKELETAL: No swelling, clubbing, or edema. Range of motion full in all extremities.  NEUROLOGIC: Cranial nerves II through XII are intact. No gross focal neurological deficits. Sensation intact. Reflexes intact.  SKIN: No ulceration, lesions, rashes, or cyanosis. Skin warm and dry. Turgor intact.  PSYCHIATRIC: Mood, affect within normal limits. The patient is awake, alert and oriented x 3. Insight, judgment intact.       IMAGING   arrative & Impression  CLINICAL DATA:  History of gastric adenocarcinoma with nausea and vomiting. * Tracking Code: BO *   EXAM: CT ABDOMEN AND PELVIS WITHOUT CONTRAST   TECHNIQUE: Multidetector CT imaging of the abdomen and pelvis was performed following the standard protocol without IV contrast.   RADIATION DOSE REDUCTION: This exam was performed according to the departmental dose-optimization program which includes automated exposure control, adjustment of the mA and/or kV according to  patient size and/or use of iterative reconstruction technique.   COMPARISON:  CT abdomen and pelvis dated 04/10/2023, CTA chest dated 04/30/2023, nuclear medicine PET dated 12/31/2022   FINDINGS: Lower chest: Partially imaged central venous catheter terminates at the superior cavoatrial junction. Re-expansion of partially imaged lingula and left lower  lobe status post thoracentesis. Diffuse patchy ground-glass opacities of the lingula and partially imaged left lower lobe. Similar large right and decreased moderate left pleural effusions. No pneumothorax. Similar pericardial effusion. Coronary artery calcifications.   Hepatobiliary: No focal hepatic lesions. No intra or extrahepatic biliary ductal dilation. Contracted gallbladder demonstrates cholelithiasis. Adrenals   Pancreas: No focal lesions or main ductal dilation.   Spleen: Normal in size without focal abnormality.   Adrenals/Urinary Tract: Unchanged 1.3 cm left adrenal nodule measures 13 HU, not previously FDG-avid, likely adenoma. No specific follow-up imaging recommended. No right adrenal nodule. Mild left hydronephrosis contains excreted renal contrast material from recent CTA chest. Contrast is seen to the level of the proximal ureter, which demonstrates mild circumferential mural thickening with adjacent soft tissue irregularity (2:52), inseparable from the anterior psoas. Excreted contrast material within the urinary bladder.   Stomach/Bowel: Decreased conspicuity of mural thickening involving the gastric cardia. No evidence of bowel wall thickening, distention, or inflammatory changes. Normal appendix, which is contained within the hernia.   Vascular/Lymphatic: Aortic atherosclerosis. Partially imaged paraesophageal lymph nodes measuring up to 9 mm (2:2) are unchanged. Continued decrease in size of perigastric node measuring 2.2 x 2.0 cm (2:34), previously 3.7 x 2.1 cm. Interval increased multi station periportal, retroaortic, and pelvic lymphadenopathy, for example   -14 mm portacaval (2:33), previously 13 mm   -10 mm left para-aortic (2:39), previously 5 mm   -14 mm right external iliac (2:74), previously 12 mm   Reproductive: Prostate is unremarkable.   Other: Trace ascites.  No free air or fluid collection.   Musculoskeletal: No acute or abnormal lytic  or blastic osseous lesions. Multilevel degenerative changes of the partially imaged thoracic and lumbar spine. Large ventral midline lower abdominal wall hernia containing nonobstructed loops of small and large bowel, as before.   IMPRESSION: 1. Mild left hydronephrosis contains excreted renal contrast material from recent CTA chest, suggestive of delayed nephrogram. Contrast is seen to the level of the proximal ureter, which demonstrates mild circumferential mural thickening with adjacent soft tissue irregularity, inseparable from the anterior psoas. Findings are suspicious for ureteral involvement by metastatic disease, less likely ascending urinary tract infection. Recommend correlation with urinalysis. 2. Interval increased multi station periportal, retroaortic, and pelvic lymphadenopathy, indeterminate, however suspicious for metastatic disease. 3. Decreased conspicuity of mural thickening involving the gastric cardia. Decreased size of perigastric nodule. 4. Re-expansion of partially imaged lingula and left lower lobe status post thoracentesis. Diffuse patchy ground-glass opacities of the lingula and partially imaged left lower lobe, which may represent re-expansion pulmonary edema. 5. Similar large right and decreased moderate left pleural effusions. 6. Similar pericardial effusion. 7. Aortic Atherosclerosis (ICD10-I70.0). Coronary artery calcifications. Assessment for potential risk factor modification, dietary therapy or pharmacologic therapy may be warranted, if clinically indicated.     Electronically Signed   By: Agustin Cree M.D.   On: 05/01/2023 15:51    ASSESSMENT/PLAN   Bilateral pleural effusions  -Patient has multiple possible etiologies of fluid retention, including CKD, liver dysfunction and cancer  - Pleural fluid profile monocyte predominant exudate indicative of chronic effusion.  He had clear yellow fluid 1.5L drawn from left pleural space. Generally  malignant effusion is  neutrophil predominant bloody exudate. The fluid here may be due to renal impairment with concomitant liver cirrosis. There is already cytology in process to rule out malignant effusion  - at this time I recommend diuresis with dual diuretic to reduce electorlyte imbalance -recommnedation for aggressive diuresis  - will continue to follow and monitor as more data is available  - nephrology consultation        Thank you for allowing me to participate in the care of this patient.   Patient/Family are satisfied with care plan and all questions have been answered.    Provider disclosure: Patient with at least one acute or chronic illness or injury that poses a threat to life or bodily function and is being managed actively during this encounter.  All of the below services have been performed independently by signing provider:  review of prior documentation from internal and or external health records.  Review of previous and current lab results.  Interview and comprehensive assessment during patient visit today. Review of current and previous chest radiographs/CT scans. Discussion of management and test interpretation with health care team and patient/family.   This document was prepared using Dragon voice recognition software and may include unintentional dictation errors.     Vida Rigger, M.D.  Division of Pulmonary & Critical Care Medicine

## 2023-05-03 DIAGNOSIS — C169 Malignant neoplasm of stomach, unspecified: Secondary | ICD-10-CM | POA: Diagnosis not present

## 2023-05-03 DIAGNOSIS — R609 Edema, unspecified: Secondary | ICD-10-CM | POA: Diagnosis not present

## 2023-05-03 DIAGNOSIS — I4821 Permanent atrial fibrillation: Secondary | ICD-10-CM

## 2023-05-03 DIAGNOSIS — N179 Acute kidney failure, unspecified: Secondary | ICD-10-CM | POA: Diagnosis not present

## 2023-05-03 DIAGNOSIS — I3139 Other pericardial effusion (noninflammatory): Secondary | ICD-10-CM | POA: Diagnosis not present

## 2023-05-03 DIAGNOSIS — I10 Essential (primary) hypertension: Secondary | ICD-10-CM | POA: Diagnosis not present

## 2023-05-03 DIAGNOSIS — J90 Pleural effusion, not elsewhere classified: Secondary | ICD-10-CM | POA: Diagnosis not present

## 2023-05-03 DIAGNOSIS — E119 Type 2 diabetes mellitus without complications: Secondary | ICD-10-CM | POA: Diagnosis not present

## 2023-05-03 LAB — BASIC METABOLIC PANEL
Anion gap: 17 — ABNORMAL HIGH (ref 5–15)
BUN: 87 mg/dL — ABNORMAL HIGH (ref 6–20)
CO2: 17 mmol/L — ABNORMAL LOW (ref 22–32)
Calcium: 8.9 mg/dL (ref 8.9–10.3)
Chloride: 99 mmol/L (ref 98–111)
Creatinine, Ser: 4.09 mg/dL — ABNORMAL HIGH (ref 0.61–1.24)
GFR, Estimated: 17 mL/min — ABNORMAL LOW (ref >60)
Glucose, Bld: 138 mg/dL — ABNORMAL HIGH (ref 70–99)
Potassium: 4.5 mmol/L (ref 3.5–5.1)
Sodium: 133 mmol/L — ABNORMAL LOW (ref 135–145)

## 2023-05-03 LAB — BODY FLUID CULTURE W GRAM STAIN: Culture: NO GROWTH

## 2023-05-03 LAB — URINALYSIS, COMPLETE (UACMP) WITH MICROSCOPIC
Bilirubin Urine: NEGATIVE
Glucose, UA: 50 mg/dL — AB
Hgb urine dipstick: NEGATIVE
Ketones, ur: NEGATIVE mg/dL
Leukocytes,Ua: NEGATIVE
Nitrite: NEGATIVE
Protein, ur: 100 mg/dL — AB
Specific Gravity, Urine: 1.018 (ref 1.005–1.030)
pH: 5 (ref 5.0–8.0)

## 2023-05-03 MED ORDER — CARMEX CLASSIC LIP BALM EX OINT: 1.0000 | TOPICAL_OINTMENT | CUTANEOUS | Status: DC | PRN

## 2023-05-03 MED ORDER — PROCHLORPERAZINE EDISYLATE 10 MG/2ML IJ SOLN
10.0000 mg | Freq: Once | INTRAMUSCULAR | Status: DC
  Filled 2023-05-03: qty 2

## 2023-05-03 MED ORDER — MUSCLE RUB 10-15 % EX CREA: 1.0000 | TOPICAL_CREAM | CUTANEOUS | Status: DC | PRN

## 2023-05-03 MED ORDER — LORATADINE 10 MG PO TABS: 10.0000 mg | ORAL_TABLET | Freq: Every day | ORAL | Status: DC | PRN

## 2023-05-03 MED ORDER — BLISTEX MEDICATED EX OINT: TOPICAL_OINTMENT | CUTANEOUS | Status: DC | PRN

## 2023-05-03 MED ORDER — CHLORHEXIDINE GLUCONATE CLOTH 2 % EX PADS
6.0000 | MEDICATED_PAD | Freq: Every day | CUTANEOUS | Status: DC
  Administered 2023-05-03 – 2023-05-06 (×4): 6 via TOPICAL

## 2023-05-03 MED ORDER — GUAIFENESIN-DM 100-10 MG/5ML PO SYRP: 10.0000 mL | ORAL_SOLUTION | ORAL | Status: DC | PRN

## 2023-05-03 MED ORDER — SODIUM CHLORIDE 0.9% FLUSH
10.0000 mL | Freq: Two times a day (BID) | INTRAVENOUS | Status: DC
  Administered 2023-05-03 – 2023-05-06 (×6): 10 mL

## 2023-05-03 MED ORDER — POLYVINYL ALCOHOL 1.4 % OP SOLN: 1.0000 [drp] | OPHTHALMIC | Status: DC | PRN

## 2023-05-03 NOTE — Progress Notes (Signed)
Heard the patient from out in the hallway dry heaving. He stated he is nauseated but doesn't have anything to throw up. Notified on call provider to request and order for an antiemetic.

## 2023-05-03 NOTE — Progress Notes (Signed)
[facility] CONSULT NOTE    Date: 05/03/2023                  Patient Name:  Jeremiah Vance  MRN: [id number]  DOB: [birth year]  Age / Sex: 53 y.o., male         PCP: Debera Lat, PA-C                 Service Requesting Consult:                  Reason for Consult: Acute kidney injury             History of Present Illness: Patient is a 53 y.o. male with a PMHx of stage IV adenocarcinoma of stomach, cirrhosis, edema, hypertension, congestive heart failure, diabetes, history of stroke, and chronic kidney disease now admitted with history of worsening edema and congestive heart failure.  On admission he had CT angiogram of the chest with IV contrast.  Since then the renal function started to get worse.  He has been on losartan and torsemide as outpatient.  Medications: Outpatient medications: Medications Prior to Admission  Medication Sig Dispense Refill Last Dose/Taking   acetaminophen (TYLENOL) 325 MG tablet Take 2 tablets (650 mg total) by mouth every 4 (four) hours as needed for mild pain (or temp > 37.5 C (99.5 F)).   Taking As Needed   apixaban (ELIQUIS) 5 MG TABS tablet Take 1 tablet (5 mg total) by mouth 2 (two) times daily. 60 tablet 1 04/29/2023 Morning   busPIRone (BUSPAR) 7.5 MG tablet Take 1 tablet (7.5 mg total) by mouth 2 (two) times daily. 180 tablet 1 04/29/2023   dapagliflozin propanediol (FARXIGA) 10 MG TABS tablet Take 1 tablet (10 mg total) by mouth daily. 30 tablet 0 04/29/2023 Morning   diclofenac Sodium (VOLTAREN) 1 % GEL Apply 4 g topically 4 (four) times daily.   Taking   losartan (COZAAR) 25 MG tablet Take 1 tablet (25 mg total) by mouth daily. 30 tablet 1 04/29/2023 Morning   metoprolol tartrate (LOPRESSOR) 25 MG tablet Take 0.5 tablets (12.5 mg total) by mouth 2 (two) times daily. 60 tablet 1 04/29/2023 Morning   ondansetron (ZOFRAN-ODT) 8 MG disintegrating tablet Take 1 tablet (8 mg total) by mouth every 8 (eight) hours as needed for  nausea or vomiting. 60 tablet 1 Taking As Needed   pantoprazole (PROTONIX) 40 MG tablet Take 1 tablet (40 mg total) by mouth 2 (two) times daily before a meal. 60 tablet 3 04/29/2023 Morning   prochlorperazine (COMPAZINE) 10 MG tablet Take 1 tablet (10 mg total) by mouth every 6 (six) hours as needed for nausea or vomiting. 60 tablet 0 Taking As Needed   sertraline (ZOLOFT) 25 MG tablet Take 1 tablet (25 mg total) by mouth daily. (Patient taking differently: Take 25 mg by mouth daily. Take along with one 50 mg tablet for total 75 mg once daily) 30 tablet 3 04/29/2023 Morning   sertraline (ZOLOFT) 50 MG tablet Take 1 tablet (50 mg total) by mouth daily. 30 tablet 3 04/29/2023 Morning   torsemide (DEMADEX) 10 MG tablet Take 1 tablet (10 mg total) by mouth daily as needed (lower ext swelling). 30 tablet 0 Taking As Needed   atorvastatin (LIPITOR) 40 MG tablet Take 1 tablet (40 mg total) by mouth daily at 6 PM. 90 tablet 3 04/28/2023   Continuous Glucose Sensor (FREESTYLE LIBRE 2 SENSOR) MISC Place 1 sensor on the skin every 14  days. Use to check glucose continuously 2 each 11    Continuous Glucose Sensor (FREESTYLE LIBRE 3 SENSOR) MISC Place 1 sensor on the skin every 14 days. Use to check glucose continuously 2 each 11    dexamethasone (DECADRON) 4 MG tablet TAKE 2 TABLETS(8 MG) BY MOUTH DAILY. START THE DAY AFTER CHEMOTHERAPY FOR 2 DAYS. TAKE WITH FOOD 30 tablet 1    feeding supplement (ENSURE ENLIVE / ENSURE PLUS) LIQD Take 237 mLs by mouth 2 (two) times daily between meals.      oxyCODONE (OXY IR/ROXICODONE) 5 MG immediate release tablet Take 1 tablet (5 mg total) by mouth every 6 (six) hours as needed for severe pain (pain score 7-10). (Patient not taking: Reported on 04/30/2023) 60 tablet 0 Not Taking    Discontinued Meds:   Medications Discontinued During This Encounter  Medication Reason   apixaban (ELIQUIS) tablet 5 mg    sertraline (ZOLOFT) tablet 50 mg    sertraline (ZOLOFT) tablet 25 mg     furosemide (LASIX) injection 60 mg    spironolactone (ALDACTONE) tablet 50 mg     Current medications: Current Facility-Administered Medications  Medication Dose Route Frequency Provider Last Rate Last Admin   albumin human 25 % solution 12.5 g  12.5 g Intravenous Daily Vida Rigger, MD 60 mL/hr at 05/03/23 0928 12.5 g at 05/03/23 0928   apixaban (ELIQUIS) tablet 5 mg  5 mg Oral BID Mila Merry A, RPH   5 mg at 05/03/23 0856   atorvastatin (LIPITOR) tablet 40 mg  40 mg Oral q1800 Kathrynn Running, MD   40 mg at 05/02/23 1835   busPIRone (BUSPAR) tablet 7.5 mg  7.5 mg Oral BID Kathrynn Running, MD   7.5 mg at 05/03/23 7829   Chlorhexidine Gluconate Cloth 2 % PADS 6 each  6 each Topical Daily Kathrynn Running, MD   6 each at 05/03/23 1100   metoprolol tartrate (LOPRESSOR) tablet 12.5 mg  12.5 mg Oral BID Kathrynn Running, MD   12.5 mg at 05/03/23 0856   midodrine (PROAMATINE) tablet 10 mg  10 mg Oral TID WC Vida Rigger, MD   10 mg at 05/03/23 1234   ondansetron (ZOFRAN-ODT) disintegrating tablet 4 mg  4 mg Oral Q8H PRN Kathrynn Running, MD   4 mg at 05/03/23 5621   oxyCODONE (Oxy IR/ROXICODONE) immediate release tablet 5 mg  5 mg Oral Q6H PRN Kathrynn Running, MD   5 mg at 05/03/23 0526   sertraline (ZOLOFT) tablet 75 mg  75 mg Oral Daily Kathrynn Running, MD   75 mg at 05/03/23 0900   sodium chloride flush (NS) 0.9 % injection 10-40 mL  10-40 mL Intracatheter Q12H Kathrynn Running, MD   10 mL at 05/03/23 3086      Allergies: Allergies  Allergen Reactions   Pollen Extract Other (See Comments)   Sulfa Antibiotics       Past Medical History: Past Medical History:  Diagnosis Date   Acute ischemic stroke (HCC) 09/06/2017   Acute renal failure superimposed on stage 3a chronic kidney disease (HCC) 07/08/2019   Acute right-sided weakness    Allergies    Anasarca    Anemia 07/10/2017   Arthritis    Cancer (HCC)    stomach and esophageal cancer stage 4   Chicken  pox    CKD (chronic kidney disease) stage 3, GFR 30-59 ml/min (HCC) 06/2017   Depression    Elevated troponin 07/08/2019   Heart  failure with mid-range ejection fraction (HCC)    a. 06/2017 Echo: EF 40-45%; b. 07/2019 Echo: EF 50-55%; c. 10/2021 Echo: EF 30-35%; d. 10/2022 Echo: EF 55-60%; e. 04/2023 Echo: EF 40-45%, glob HK, mod LVH, mildly reduced RV fxn, mod dil LA, mild MR, Ao root 41mm, asc Ao 42mm.   High cholesterol    History of cardiomyopathy    LVEF 40 to 45% in April 2019 - subsequently normalized   Hyperbilirubinemia 07/10/2017   Hypertension    Ischemic stroke (HCC)    Small left internal capsule infarct due to lacunar disease   Morbid obesity (HCC)    NICM (nonischemic cardiomyopathy) (HCC)    a. 06/2017 Echo: EF 40-45%; b. 07/2019 Echo: EF 50-55%; c. 10/2021 Echo: EF 30-35%; d. 12/2021 MV: EF 60%, no ischemia/scar; e. 10/2022 Echo: EF 55-60%; f. 04/2023 Echo: EF 40-45%.   Normocytic anemia 12/16/2017   Permanent atrial fibrillation (HCC)    a. CHA2DS2VASc = 5-->eliquis.   Recurrent incisional hernia with incarceration s/p repair 10/22/2017 10/21/2017   Stroke (HCC) 08/2017   "right sided weakness since; getting stronger though" (10/22/2017)   Type 2 diabetes mellitus [facility])      Past Surgical History: Past Surgical History:  Procedure Laterality Date   ABDOMINAL HERNIA REPAIR  2008; 10/22/2017   "scope; OPEN REPAIR INCARCERATED VENTRAL HERNIA   ESOPHAGOGASTRODUODENOSCOPY (EGD) WITH PROPOFOL N/A 09/10/2022   Procedure: ESOPHAGOGASTRODUODENOSCOPY (EGD) WITH PROPOFOL;  Surgeon: Toney Reil, MD;  Location: [facility] ENDOSCOPY;  Service: Gastroenterology;  Laterality: N/A;   ESOPHAGOGASTRODUODENOSCOPY (EGD) WITH PROPOFOL N/A 09/28/2022   Procedure: ESOPHAGOGASTRODUODENOSCOPY (EGD) WITH PROPOFOL;  Surgeon: Jaynie Collins, DO;  Location: [facility] ENDOSCOPY;  Service: Gastroenterology;  Laterality: N/A;   HEMOSTASIS CONTROL  09/28/2022   Procedure: HEMOSTASIS CONTROL;  Surgeon:  Jaynie Collins, DO;  Location: [facility] ENDOSCOPY;  Service: Gastroenterology;;   HERNIA REPAIR     KNEE ARTHROSCOPY Right 1989   PORTA CATH INSERTION N/A 09/25/2022   Procedure: PORTA CATH INSERTION;  Surgeon: Annice Needy, MD;  Location: [facility] INVASIVE CV LAB;  Service: Cardiovascular;  Laterality: N/A;   VENTRAL HERNIA REPAIR N/A 10/22/2017   Procedure: OPEN REPAIR INCARCERATED VENTRAL HERNIA;  Surgeon: Glenna Fellows, MD;  Location: [facility] OR;  Service: General;  Laterality: N/A;     Family History: Family History  Problem Relation Age of Onset   Diabetes Mother    Stroke Mother    Arthritis Mother    Depression Mother    Heart disease Mother    Hypertension Mother    Learning disabilities Mother    Mental illness Mother    Sleep apnea Mother    Diabetes Father    Heart disease Father    Arthritis Father    Hearing loss Father    Hyperlipidemia Father    Heart attack Father    Hypertension Father    Stroke Father    Diabetes Sister    Depression Sister    Diabetes Sister    Hypertension Sister    Mental illness Sister    Sleep apnea Sister    Diabetes Maternal Grandmother    Heart disease Maternal Grandmother    Depression Maternal Grandmother    Hyperlipidemia Maternal Grandmother    Hypertension Maternal Grandmother    Stroke Maternal Grandmother    Hypertension Maternal Grandfather    Hyperlipidemia Maternal Grandfather    Stroke Maternal Grandfather    Arthritis Paternal Grandmother    Hearing loss Paternal Grandmother    Stroke Paternal  Grandfather    Heart disease Paternal Grandfather    Arthritis Paternal Grandfather    Heart attack Paternal Grandfather    Melanoma Other      Social History: Social History   Socioeconomic History   Marital status: Divorced    Spouse name: Not on file   Number of children: 0   Years of education: Not on file   Highest education level: Not on file  Occupational History   Occupation: Disabled  Tobacco Use    Smoking status: Never   Smokeless tobacco: Never  Vaping Use   Vaping status: Never Used  Substance and Sexual Activity   Alcohol use: Yes    Comment: occasional beer   Drug use: Never   Sexual activity: Not Currently  Other Topics Concern   Not on file  Social History Narrative   He lives with his sister , [name] and she is his MPOA after his stokes   Social Drivers of Corporate investment banker Strain: Low Risk  (04/24/2023)   Overall Financial Resource Strain (CARDIA)    Difficulty of Paying Living Expenses: Not hard at all  Food Insecurity: No Food Insecurity (04/30/2023)   Hunger Vital Sign    Worried About Running Out of Food in the Last Year: Never true    Ran Out of Food in the Last Year: Never true  Transportation Needs: No Transportation Needs (04/30/2023)   PRAPARE - Administrator, Civil Service (Medical): No    Lack of Transportation (Non-Medical): No  Physical Activity: Not on file  Stress: Not on file  Social Connections: Moderately Isolated (04/30/2023)   Social Connection and Isolation Panel [NHANES]    Frequency of Communication with Friends and Family: Three times a week    Frequency of Social Gatherings with Friends and Family: Once a week    Attends Religious Services: 1 to 4 times per year    Active Member of [employer] or Organizations: No    Attends Banker Meetings: Never    Marital Status: Divorced  Catering manager Violence: Not At Risk (04/30/2023)   Humiliation, Afraid, Rape, and Kick questionnaire    Fear of Current or Ex-Partner: No    Emotionally Abused: No    Physically Abused: No    Sexually Abused: No     Review of Systems: As per HPI  Vital Signs: Blood pressure 128/85, pulse 67, temperature 97.7 F (36.5 C), resp. rate 17, height 5\' 11"  (1.803 m), weight 106 kg, SpO2 92%.  Weight trends: Filed Weights   04/29/23 1659  Weight: 106 kg    Physical Exam: Physical Exam: General:  No acute distress  Head:   Normocephalic, atraumatic. Moist oral mucosal membranes  Eyes:  Anicteric  Neck:  Supple  Lungs:   Clear to auscultation, normal effort  Heart:  S1S2 no rubs  Abdomen:   Soft, nontender, bowel sounds present  Extremities:  peripheral edema.  Neurologic:  Awake, alert, following commands  Skin:  No lesions  Access:     Lab results:  Basic Metabolic Panel: Recent Labs  Lab 04/27/23 0432 04/29/23 1655 05/01/23 0326 05/02/23 1054 05/03/23 0540  NA 138 137 135 132* 133*  K 3.7 4.4 4.8 4.5 4.5  CL 107 102 103 101 99  CO2 20* 20* 20* 19* 17*  GLUCOSE 105* 156* 114* 165* 138*  BUN 46* 54* 62* 78* 87*  CREATININE 2.11* 2.36* 2.84* 3.52* 4.09*  CALCIUM 8.7* 8.6* 8.6* 8.6* 8.9  Creatinine  Date/Time Value Ref Range Status  04/15/2023 08:48 AM 2.21 (H) 0.61 - 1.24 mg/dL Final  09/81/1914 78:29 AM 1.44 (H) 0.61 - 1.24 mg/dL Final  56/21/3086 57:84 AM 1.41 (H) 0.61 - 1.24 mg/dL Final  69/62/9528 41:32 AM 1.31 (H) 0.61 - 1.24 mg/dL Final  44/04/270 53:66 AM 1.58 (H) 0.61 - 1.24 mg/dL Final  44/05/4740 59:56 AM 1.31 (H) 0.61 - 1.24 mg/dL Final  38/75/6433 29:51 AM 1.49 (H) 0.61 - 1.24 mg/dL Final  88/41/6606 30:16 AM 1.03 0.61 - 1.24 mg/dL Final  04/09/3233 57:32 AM 1.44 (H) 0.61 - 1.24 mg/dL Final  20/25/4270 62:37 AM 1.43 (H) 0.61 - 1.24 mg/dL Final  62/83/1517 61:60 AM 1.02 0.61 - 1.24 mg/dL Final  73/71/0626 94:85 AM 1.36 (H) 0.61 - 1.24 mg/dL Final  46/27/0350 09:38 PM 1.01 0.61 - 1.24 mg/dL Final  18/29/9371 69:67 AM 1.30 (H) 0.61 - 1.24 mg/dL Final  89/38/1017 51:02 PM 1.12 0.61 - 1.24 mg/dL Final  58/52/7782 42:35 AM 1.72 (H) 0.61 - 1.24 mg/dL Final  36/14/4315 40:08 AM 1.78 (H) 0.61 - 1.24 mg/dL Final   Creatinine, Ser  Date/Time Value Ref Range Status  05/03/2023 05:40 AM 4.09 (H) 0.61 - 1.24 mg/dL Final  67/61/9509 32:67 AM 3.52 (H) 0.61 - 1.24 mg/dL Final  12/45/8099 83:38 AM 2.84 (H) 0.61 - 1.24 mg/dL Final  25/07/3974 73:41 PM 2.36 (H) 0.61 - 1.24 mg/dL Final   93/79/0240 97:35 AM 2.11 (H) 0.61 - 1.24 mg/dL Final  32/99/2426 83:41 AM 2.22 (H) 0.61 - 1.24 mg/dL Final  96/22/2979 89:21 AM 1.90 (H) 0.61 - 1.24 mg/dL Final  19/41/7408 14:48 AM 1.83 (H) 0.61 - 1.24 mg/dL Final  18/56/3149 70:26 PM 1.97 (H) 0.61 - 1.24 mg/dL Final  37/85/8850 27:74 AM 1.79 (H) 0.61 - 1.24 mg/dL Final  12/87/8676 72:09 AM 1.63 (H) 0.61 - 1.24 mg/dL Final  47/11/6281 66:29 AM 1.69 (H) 0.61 - 1.24 mg/dL Final  47/65/4650 35:46 AM 1.43 (H) 0.61 - 1.24 mg/dL Final  56/81/2751 70:01 AM 1.37 (H) 0.61 - 1.24 mg/dL Final  74/94/4967 59:16 AM 1.29 (H) 0.61 - 1.24 mg/dL Final  38/46/6599 35:70 PM 1.51 (H) 0.61 - 1.24 mg/dL Final  17/79/3903 00:92 PM 1.79 (H) 0.61 - 1.24 mg/dL Final  33/00/7622 63:33 AM 1.73 (H) 0.61 - 1.24 mg/dL Final  54/56/2563 89:37 AM 1.90 (H) 0.61 - 1.24 mg/dL Final  34/28/7681 15:72 AM 1.55 (H) 0.61 - 1.24 mg/dL Final  62/05/5595 41:63 AM 1.40 (H) 0.61 - 1.24 mg/dL Final  84/53/6468 03:21 PM 1.28 (H) 0.61 - 1.24 mg/dL Final  22/48/2500 37:04 PM 1.83 (H) 0.76 - 1.27 mg/dL Final  88/89/1694 50:38 PM 1.63 (H) 0.61 - 1.24 mg/dL Final  88/28/0034 91:79 AM 1.71 (H) 0.61 - 1.24 mg/dL Final  15/07/6977 48:01 PM 1.93 (H) 0.61 - 1.24 mg/dL Final  65/53/7482 70:78 AM 1.61 (H) 0.76 - 1.27 mg/dL Final  67/54/4920 10:07 AM 1.72 (H) 0.61 - 1.24 mg/dL Final  03/20/7587 32:54 AM 1.86 (H) 0.61 - 1.24 mg/dL Final  98/26/4158 30:94 PM 1.85 (H) 0.61 - 1.24 mg/dL Final  07/68/0881 10:31 AM 1.84 (H) 0.61 - 1.24 mg/dL Final  59/45/8592 92:44 AM 2.55 (H) 0.61 - 1.24 mg/dL Final  62/86/3817 71:16 PM 1.87 (H) 0.61 - 1.24 mg/dL Final  57/90/3833 38:32 PM 1.81 (H) 0.61 - 1.24 mg/dL Final  91/91/6606 00:45 PM 1.98 (H) 0.76 - 1.27 mg/dL Final  99/77/4142 39:53 PM 1.58 (H) 0.61 - 1.24 mg/dL Final  20/23/3435 68:61  AM 1.57 (H) 0.61 - 1.24 mg/dL Final  09/81/1914 78:29 AM 1.47 (H) 0.61 - 1.24 mg/dL Final  56/21/3086 57:84 AM 1.36 (H) 0.61 - 1.24 mg/dL Final  69/62/9528 41:32 AM  1.32 (H) 0.76 - 1.27 mg/dL Final    Comment:                   **Effective May 29, 2020 [facility] will begin**                  reporting the 2021 CKD-EPI creatinine equation that                  estimates kidney function without a race variable.   03/09/2020 09:59 AM 1.31 (H) 0.61 - 1.24 mg/dL Final  44/04/270 53:66 AM 1.68 (H) 0.61 - 1.24 mg/dL Final  44/05/4740 59:56 AM 1.70 (H) 0.61 - 1.24 mg/dL Final  38/75/6433 29:51 AM 1.77 (H) 0.61 - 1.24 mg/dL Final  88/41/6606 30:16 AM 1.89 (H) 0.61 - 1.24 mg/dL Final  04/09/3233 57:32 AM 1.79 (H) 0.61 - 1.24 mg/dL Final  20/25/4270 62:37 AM 1.56 (H) 0.61 - 1.24 mg/dL Final  62/83/1517 61:60 AM 1.33 (H) 0.61 - 1.24 mg/dL Final  73/71/0626 94:85 PM 1.41 (H) 0.61 - 1.24 mg/dL Final  46/27/0350 09:38 PM 1.28 0.40 - 1.50 mg/dL Final  18/29/9371 69:67 AM 1.69 (H) 0.61 - 1.24 mg/dL Final  89/38/1017 51:02 PM 1.62 (H) 0.40 - 1.50 mg/dL Final    CBC: Recent Labs  Lab 04/27/23 0432 04/29/23 1655 05/01/23 0326 05/02/23 1054  WBC 4.6 5.9 7.6 7.0  HGB 11.3* 11.5* 11.0* 10.6*  HCT 34.7* 35.9* 35.3* 33.2*  MCV 86.8 90.2 91.0 89.2  PLT 197 182 188 228    Microbiology: Results for orders placed or performed during the hospital encounter of 04/30/23  Resp panel by RT-PCR (RSV, Flu A&B, Covid) Anterior Nasal Swab     Status: None   Collection Time: 04/30/23  6:22 AM   Specimen: Anterior Nasal Swab  Result Value Ref Range Status   SARS Coronavirus 2 by RT PCR NEGATIVE NEGATIVE Final    Comment: (NOTE) SARS-CoV-2 target nucleic acids are NOT DETECTED.  The SARS-CoV-2 RNA is generally detectable in upper respiratory specimens during the acute phase of infection. The lowest concentration of SARS-CoV-2 viral copies this assay can detect is 138 copies/mL. A negative result does not preclude SARS-Cov-2 infection and should not be used as the sole basis for treatment or other patient management decisions. A negative result may occur with   improper specimen collection/handling, submission of specimen other than nasopharyngeal swab, presence of viral mutation(s) within the areas targeted by this assay, and inadequate number of viral copies(<138 copies/mL). A negative result must be combined with clinical observations, patient history, and epidemiological information. The expected result is Negative.  Fact Sheet for Patients:  BloggerCourse.com  Fact Sheet for Healthcare Providers:  SeriousBroker.it  This test is no t yet approved or cleared by the [place] FDA and  has been authorized for detection and/or diagnosis of SARS-CoV-2 by FDA under an Emergency Use Authorization (EUA). This EUA will remain  in effect (meaning this test can be used) for the duration of the COVID-19 declaration under Section 564(b)(1) of the Act, 21 U.S.C.section 360bbb-3(b)(1), unless the authorization is terminated  or revoked sooner.       Influenza A by PCR NEGATIVE NEGATIVE Final   Influenza B by PCR NEGATIVE NEGATIVE Final    Comment: (NOTE) The Xpert  Xpress SARS-CoV-2/FLU/RSV plus assay is intended as an aid in the diagnosis of influenza from Nasopharyngeal swab specimens and should not be used as a sole basis for treatment. Nasal washings and aspirates are unacceptable for Xpert Xpress SARS-CoV-2/FLU/RSV testing.  Fact Sheet for Patients: BloggerCourse.com  Fact Sheet for Healthcare Providers: SeriousBroker.it  This test is not yet approved or cleared by the [place] FDA and has been authorized for detection and/or diagnosis of SARS-CoV-2 by FDA under an Emergency Use Authorization (EUA). This EUA will remain in effect (meaning this test can be used) for the duration of the COVID-19 declaration under Section 564(b)(1) of the Act, 21 U.S.C. section 360bbb-3(b)(1), unless the authorization is terminated or revoked.      Resp Syncytial Virus by PCR NEGATIVE NEGATIVE Final    Comment: (NOTE) Fact Sheet for Patients: BloggerCourse.com  Fact Sheet for Healthcare Providers: SeriousBroker.it  This test is not yet approved or cleared by the [place] FDA and has been authorized for detection and/or diagnosis of SARS-CoV-2 by FDA under an Emergency Use Authorization (EUA). This EUA will remain in effect (meaning this test can be used) for the duration of the COVID-19 declaration under Section 564(b)(1) of the Act, 21 U.S.C. section 360bbb-3(b)(1), unless the authorization is terminated or revoked.  Performed at [facility], [address] [place], [place] [address]   Body fluid culture w Gram Stain     Status: None   Collection Time: 04/30/23 10:52 AM   Specimen: PATH Cytology Pleural fluid  Result Value Ref Range Status   Specimen Description   Final    PLEURAL Performed at [facility], [address], [place], [place] [address]    Special Requests   Final    PLEURAL Performed at [facility], [address] [place], [place] [address]    Gram Stain   Final    FEW WBC PRESENT, PREDOMINANTLY MONONUCLEAR NO ORGANISMS SEEN    Culture   Final    NO GROWTH 3 DAYS Performed at [facility], [address], [place], [place] [address]    Report Status 05/03/2023 FINAL  Final    Urinalysis: Recent Labs    05/02/23 0240  COLORURINE AMBER*  LABSPEC 1.018  PHURINE 5.0  GLUCOSEU 50*  HGBUR NEGATIVE  BILIRUBINUR NEGATIVE  KETONESUR NEGATIVE  PROTEINUR 100*  NITRITE NEGATIVE  LEUKOCYTESUR NEGATIVE     Imaging:  No results found.   Assessment & Plan:  53 y.o. male with a PMHx of stage IV adenocarcinoma of stomach, cirrhosis, edema, hypertension, congestive heart failure, diabetes, history of stroke, and chronic kidney disease now admitted with history of worsening edema and congestive heart  failure.  On admission he had CT angiogram of the chest with IV contrast.  Since then the renal function started to get worse.  He has been on losartan and torsemide as outpatient.   Principal Problem:   Pleural effusion Active Problems:   Moderate obstructive sleep apnea   History of CVA (cerebrovascular accident)   Other cirrhosis of liver (HCC)   HFrEF (heart failure with reduced ejection fraction) (HCC)   Paroxysmal atrial fibrillation (HCC)   Gastric cancer (HCC)   Acute on chronic systolic heart failure (HCC)   Chronic kidney disease, stage 3b (HCC)   Chemotherapy-induced neuropathy (HCC)   PAF (paroxysmal atrial fibrillation) (HCC)   Diabetes mellitus (HCC)   Class 1 obesity with serious comorbidity in adult   Benign essential HTN   Status post thoracentesis  Palliative care encounter   Elevated troponin   Chest pain  #1: Acute kidney injury on chronic kidney disease: The chronic kidney disease can be secondary to hypertension complicated by diabetic kidney disease.  Acute kidney injury is most likely secondary to contrast nephropathy.  Patient says he has been urinating well.  Will continue to monitor closely.  Will continue to hold the diuretics and losartan.  #2: Pericardial effusion/pleural effusion: Will continue to monitor closely.  Will use low-dose diuretics as needed.  #3: Gastric carcinoma: Patient has stage IV adenocarcinoma.  Being followed by oncology.  Prognosis seems to be poor.  #4: Cirrhosis/edema: Will continue to monitor closely.  Diuretics on as needed basis.  Spoke to the entire family at bedside in detail. Answered all their questions to their satisfaction.  LOS: 3 Lorain Childes, MD [facility]. 2/1/20253:10 PM

## 2023-05-03 NOTE — Progress Notes (Signed)
Rounding Note    Patient Name: Jeremiah Vance Date of Encounter: 05/03/2023  [facility] Cardiologist: Debbe Odea, MD   Subjective   Kidney function continues to rise. Diuretics on hold. Patient reports he feels the same. Remains on 3L O2. No chest pain reported.   Inpatient Medications    Scheduled Meds:  apixaban  5 mg Oral BID   atorvastatin  40 mg Oral q1800   busPIRone  7.5 mg Oral BID   Chlorhexidine Gluconate Cloth  6 each Topical Daily   metoprolol tartrate  12.5 mg Oral BID   midodrine  10 mg Oral TID WC   sertraline  75 mg Oral Daily   sodium chloride flush  10-40 mL Intracatheter Q12H   Continuous Infusions:  albumin human Stopped (05/02/23 1237)   PRN Meds: ondansetron, oxyCODONE   Vital Signs    Vitals:   05/02/23 2256 05/03/23 0026 05/03/23 0027 05/03/23 0526  BP: (!) 117/90 (!) 131/103 (!) 165/106 (!) 126/97  Pulse:  89 86 85  Resp:  16 16   Temp:  98.1 F (36.7 C) 98.1 F (36.7 C) 97.8 F (36.6 C)  TempSrc:      SpO2: 95% 97% 97% 97%  Weight:      Height:        Intake/Output Summary (Last 24 hours) at 05/03/2023 1478 Last data filed at 05/02/2023 1237 Gross per 24 hour  Intake 50 ml  Output --  Net 50 ml      04/29/2023    4:59 PM 04/28/2023    2:18 AM 04/27/2023    5:00 AM  Last 3 Weights  Weight (lbs) 233 lb 11 oz 233 lb 11 oz 230 lb 6.1 oz  Weight (kg) 106 kg 106 kg 104.5 kg      Telemetry    Afib HR 80-90s, elevates on exertion - Personally Reviewed  ECG    No new - Personally Reviewed  Physical Exam   GEN: No acute distress.   Neck: No JVD Cardiac: Irreg IRreg, no murmurs, rubs, or gallops.  Respiratory: diminished at bases GI: Soft, nontender, non-distended  MS: No edema; No deformity. Neuro:  Nonfocal  Psych: Normal affect   Labs    High Sensitivity Troponin:   Recent Labs  Lab 04/29/23 1655 04/29/23 2254  TROPONINIHS 55* 53*     Chemistry Recent Labs  Lab 05/01/23 0326 05/02/23 1054  05/03/23 0540  NA 135 132* 133*  K 4.8 4.5 4.5  CL 103 101 99  CO2 20* 19* 17*  GLUCOSE 114* 165* 138*  BUN 62* 78* 87*  CREATININE 2.84* 3.52* 4.09*  CALCIUM 8.6* 8.6* 8.9  PROT 7.1  7.3  --   --   ALBUMIN 3.0*  --   --   AST 30  --   --   ALT 22  --   --   ALKPHOS 143*  --   --   BILITOT 0.9  --   --   GFRNONAA 26* 20* 17*  ANIONGAP 12 12 17*    Lipids No results for input(s): "CHOL", "TRIG", "HDL", "LABVLDL", "LDLCALC", "CHOLHDL" in the last 168 hours.  Hematology Recent Labs  Lab 04/29/23 1655 05/01/23 0326 05/02/23 1054  WBC 5.9 7.6 7.0  RBC 3.98* 3.88* 3.72*  HGB 11.5* 11.0* 10.6*  HCT 35.9* 35.3* 33.2*  MCV 90.2 91.0 89.2  MCH 28.9 28.4 28.5  MCHC 32.0 31.2 31.9  RDW 17.9* 17.8* 17.8*  PLT 182 188 228  Thyroid No results for input(s): "TSH", "FREET4" in the last 168 hours.  BNP Recent Labs  Lab 05/01/23 0326  BNP 33.6    DDimer No results for input(s): "DDIMER" in the last 168 hours.   Radiology    CT ABDOMEN PELVIS WO CONTRAST Result Date: 05/01/2023 CLINICAL DATA:  History of gastric adenocarcinoma with nausea and vomiting. * Tracking Code: BO * EXAM: CT ABDOMEN AND PELVIS WITHOUT CONTRAST TECHNIQUE: Multidetector CT imaging of the abdomen and pelvis was performed following the standard protocol without IV contrast. RADIATION DOSE REDUCTION: This exam was performed according to the departmental dose-optimization program which includes automated exposure control, adjustment of the mA and/or kV according to patient size and/or use of iterative reconstruction technique. COMPARISON:  CT abdomen and pelvis dated 04/10/2023, CTA chest dated 04/30/2023, nuclear medicine PET dated 12/31/2022 FINDINGS: Lower chest: Partially imaged central venous catheter terminates at the superior cavoatrial junction. Re-expansion of partially imaged lingula and left lower lobe status post thoracentesis. Diffuse patchy ground-glass opacities of the lingula and partially imaged left  lower lobe. Similar large right and decreased moderate left pleural effusions. No pneumothorax. Similar pericardial effusion. Coronary artery calcifications. Hepatobiliary: No focal hepatic lesions. No intra or extrahepatic biliary ductal dilation. Contracted gallbladder demonstrates cholelithiasis. Adrenals Pancreas: No focal lesions or main ductal dilation. Spleen: Normal in size without focal abnormality. Adrenals/Urinary Tract: Unchanged 1.3 cm left adrenal nodule measures 13 HU, not previously FDG-avid, likely adenoma. No specific follow-up imaging recommended. No right adrenal nodule. Mild left hydronephrosis contains excreted renal contrast material from recent CTA chest. Contrast is seen to the level of the proximal ureter, which demonstrates mild circumferential mural thickening with adjacent soft tissue irregularity (2:52), inseparable from the anterior psoas. Excreted contrast material within the urinary bladder. Stomach/Bowel: Decreased conspicuity of mural thickening involving the gastric cardia. No evidence of bowel wall thickening, distention, or inflammatory changes. Normal appendix, which is contained within the hernia. Vascular/Lymphatic: Aortic atherosclerosis. Partially imaged paraesophageal lymph nodes measuring up to 9 mm (2:2) are unchanged. Continued decrease in size of perigastric node measuring 2.2 x 2.0 cm (2:34), previously 3.7 x 2.1 cm. Interval increased multi station periportal, retroaortic, and pelvic lymphadenopathy, for example -14 mm portacaval (2:33), previously 13 mm -10 mm left para-aortic (2:39), previously 5 mm -14 mm right external iliac (2:74), previously 12 mm Reproductive: Prostate is unremarkable. Other: Trace ascites.  No free air or fluid collection. Musculoskeletal: No acute or abnormal lytic or blastic osseous lesions. Multilevel degenerative changes of the partially imaged thoracic and lumbar spine. Large ventral midline lower abdominal wall hernia containing  nonobstructed loops of small and large bowel, as before. IMPRESSION: 1. Mild left hydronephrosis contains excreted renal contrast material from recent CTA chest, suggestive of delayed nephrogram. Contrast is seen to the level of the proximal ureter, which demonstrates mild circumferential mural thickening with adjacent soft tissue irregularity, inseparable from the anterior psoas. Findings are suspicious for ureteral involvement by metastatic disease, less likely ascending urinary tract infection. Recommend correlation with urinalysis. 2. Interval increased multi station periportal, retroaortic, and pelvic lymphadenopathy, indeterminate, however suspicious for metastatic disease. 3. Decreased conspicuity of mural thickening involving the gastric cardia. Decreased size of perigastric nodule. 4. Re-expansion of partially imaged lingula and left lower lobe status post thoracentesis. Diffuse patchy ground-glass opacities of the lingula and partially imaged left lower lobe, which may represent re-expansion pulmonary edema. 5. Similar large right and decreased moderate left pleural effusions. 6. Similar pericardial effusion. 7. Aortic Atherosclerosis (ICD10-I70.0). Coronary artery calcifications.  Assessment for potential risk factor modification, dietary therapy or pharmacologic therapy may be warranted, if clinically indicated. Electronically Signed   By: Agustin Cree M.D.   On: 05/01/2023 15:51    Cardiac Studies   Limited echo 04/30/23 1. Left ventricular ejection fraction, by estimation, is 40 to 45%. The  left ventricle has mildly decreased function. The left ventricle  demonstrates global hypokinesis. There is moderate left ventricular  hypertrophy. Left ventricular diastolic  parameters are indeterminate.   2. Right ventricular systolic function is mildly reduced. The right  ventricular size is normal. Tricuspid regurgitation signal is inadequate  for assessing PA pressure.   3. Moderate pericardial  effusion. There is no evidence of cardiac  tamponade.   4. The mitral valve is normal in structure. No evidence of mitral valve  regurgitation. No evidence of mitral stenosis.   5. The aortic valve is normal in structure. Aortic valve regurgitation is  not visualized. No aortic stenosis is present.   6. The inferior vena cava is dilated in size with >50% respiratory  variability, suggesting right atrial pressure of 8 mmHg.    Echo 04/2023 1. Left ventricular ejection fraction, by estimation, is 40 to 45%. The  left ventricle has mildly decreased function. The left ventricle  demonstrates global hypokinesis. There is moderate left ventricular  hypertrophy. Left ventricular diastolic  parameters are indeterminate.   2. Right ventricular systolic function is mildly reduced. The right  ventricular size is normal.   3. Left atrial size was moderately dilated.   4. A small pericardial effusion is present.   5. The mitral valve is normal in structure. Mild mitral valve  regurgitation. No evidence of mitral stenosis.   6. The aortic valve is normal in structure. Aortic valve regurgitation is  not visualized. No aortic stenosis is present.   7. There is mild dilatation of the aortic root, measuring 41 mm. There is  mild dilatation of the ascending aorta, measuring 42 mm.   8. The inferior vena cava is normal in size with greater than 50%  respiratory variability, suggesting right atrial pressure of 3 mmHg.      Echo 10/2022 1. Left ventricular ejection fraction, by estimation, is 55 to 60%. The  left ventricle has normal function. The left ventricle has no regional  wall motion abnormalities. There is mild left ventricular hypertrophy.  Left ventricular diastolic parameters  are indeterminate.   2. Right ventricular systolic function is low normal. The right  ventricular size is mildly enlarged. There is normal pulmonary artery  systolic pressure.   3. Left atrial size was mildly dilated.    4. The mitral valve is normal in structure. Mild mitral valve  regurgitation.   5. The aortic valve is tricuspid. Aortic valve regurgitation is not  visualized.   6. Aortic dilatation noted. There is borderline dilatation of the aortic  root, measuring 38 mm.   7. The inferior vena cava is dilated in size with <50% respiratory  variability, suggesting right atrial pressure of 15 mmHg.     Patient Profile     54 y.o. 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     Assessment & Plan    Acute on chronic respiratory failure Pleural effusion - s/p  thoracentesis -1.5 L on 1/29 -Suspected pleural effusion not d/t malignancy -Patient is requiring 2 L O2 -Patient was given 1 dose of IV Lasix in the ER resulting in worsening kidney function - CT abd/pelvis 1/30 showed similar large right and decreased left pleural effusions - started on IV lasix 60mg  daily, spironolactone 50mg  daily , midodrine and albumin by pulmonology. Diuretics held 1/31 for worsening kidney function - pulmonology is following - can consider right heart cath if status doesn't improve   Pericardial effusion - echo 1/23 showed small pericardial effusion -CT scan was concerning for moderate to severe pericardial effusion -limited echo 1/29 showed LVEF 40-45%, moderate pericardial effusion, no tamponade - we will continue to monitor this. - repeat limited echo on Monday  AKI on CKD stage 3 - Scr 2.22>2.11>2.36>2.84>3.52>4.09 - diuretics held - IV contrast 1/29 - nephrology is following   Elevated troponin -The setting of respiratory distress and other acute issues -Suspect supply demand mismatch -No aspirin given Eliquis - no plan for  ischemic work-up at this time   Nonischemic cardiomyopathy - Rcho 1/23  EF of 40 to 45% with moderate LVH - Echo 10/2022 LVEF 55-60%, no WMA mild LVH - Echo 04/30/23 showed LVEF 40-45%, mildly reduced RVSF, moderate pericardial effusion - IV lasix and spiro held as above -Appears euvolemic -PTA Farxiga, losartan, metoprolol. Farxiga and losartan held on admission - PTA torsemide 10mg  PRN swelling   Permanent Afib - in rate controlled Afib - continue Eliquis for stroke ppx - continue BB for rate control  For questions or updates, please contact [facility] Please consult [email or web address] for contact info under        Signed, Maverik Foot David Stall, PA-C  05/03/2023, 7:12 AM

## 2023-05-03 NOTE — Progress Notes (Signed)
PULMONOLOGY         Date: 05/03/2023,   MRN# [id number] Jeremiah Vance [birth year]     AdmissionWeight: 106 kg                 CurrentWeight: 106 kg   CHIEF COMPLAINT:   Recurrent pleural effusion and anasarca   HISTORY OF PRESENT ILLNESS   This is a 53 y.o. male with medical history significant of GE junction tage 4 gastric adenocarcinoma, osa, cirrhosis, on chemotherapy, HTN, HLD, DM, dCHF,  stroke with mild left sided weakness, depression with anxiety, A-fib on Eliquis, obesity, who presents with SOB and anasarca. He was dcd with similar issues few days ago. He is reporting chest discomfort. He is noted to have severe LE edema, gut edema and decreased breath sounds. He had CT chest with findings of b/l pleural effusions and pericardial effusions.  He had thoracentesis done. PCCM consultation for further evaluation and management.    05/03/23- patient seen with sister at bedside. He appears less edematous.  He is not in distress.  His renal function appears to be worse.  He does have nephrology consultation in process.  We discussed pleural fluid profile and it is not consistent with malignant effusion. He is lucid and appropriate.   PAST MEDICAL HISTORY   Past Medical History:  Diagnosis Date   Acute ischemic stroke (HCC) 09/06/2017   Acute renal failure superimposed on stage 3a chronic kidney disease (HCC) 07/08/2019   Acute right-sided weakness    Allergies    Anasarca    Anemia 07/10/2017   Arthritis    Cancer (HCC)    stomach and esophageal cancer stage 4   Chicken pox    CKD (chronic kidney disease) stage 3, GFR 30-59 ml/min (HCC) 06/2017   Depression    Elevated troponin 07/08/2019   Heart failure with mid-range ejection fraction (HCC)    a. 06/2017 Echo: EF 40-45%; b. 07/2019 Echo: EF 50-55%; c. 10/2021 Echo: EF 30-35%; d. 10/2022 Echo: EF 55-60%; e. 04/2023 Echo: EF 40-45%, glob HK, mod LVH, mildly reduced RV fxn, mod dil LA, mild MR, Ao root 41mm, asc Ao 42mm.    High cholesterol    History of cardiomyopathy    LVEF 40 to 45% in April 2019 - subsequently normalized   Hyperbilirubinemia 07/10/2017   Hypertension    Ischemic stroke (HCC)    Small left internal capsule infarct due to lacunar disease   Morbid obesity (HCC)    NICM (nonischemic cardiomyopathy) (HCC)    a. 06/2017 Echo: EF 40-45%; b. 07/2019 Echo: EF 50-55%; c. 10/2021 Echo: EF 30-35%; d. 12/2021 MV: EF 60%, no ischemia/scar; e. 10/2022 Echo: EF 55-60%; f. 04/2023 Echo: EF 40-45%.   Normocytic anemia 12/16/2017   Permanent atrial fibrillation (HCC)    a. CHA2DS2VASc = 5-->eliquis.   Recurrent incisional hernia with incarceration s/p repair 10/22/2017 10/21/2017   Stroke (HCC) 08/2017   "right sided weakness since; getting stronger though" (10/22/2017)   Type 2 diabetes mellitus [facility])      SURGICAL HISTORY   Past Surgical History:  Procedure Laterality Date   ABDOMINAL HERNIA REPAIR  2008; 10/22/2017   "scope; OPEN REPAIR INCARCERATED VENTRAL HERNIA   ESOPHAGOGASTRODUODENOSCOPY (EGD) WITH PROPOFOL N/A 09/10/2022   Procedure: ESOPHAGOGASTRODUODENOSCOPY (EGD) WITH PROPOFOL;  Surgeon: Toney Reil, MD;  Location: [facility] ENDOSCOPY;  Service: Gastroenterology;  Laterality: N/A;   ESOPHAGOGASTRODUODENOSCOPY (EGD) WITH PROPOFOL N/A 09/28/2022   Procedure: ESOPHAGOGASTRODUODENOSCOPY (EGD) WITH PROPOFOL;  Surgeon: Timothy Lasso,  Thomas Hoff, DO;  Location: [facility] ENDOSCOPY;  Service: Gastroenterology;  Laterality: N/A;   HEMOSTASIS CONTROL  09/28/2022   Procedure: HEMOSTASIS CONTROL;  Surgeon: Jaynie Collins, DO;  Location: [facility] ENDOSCOPY;  Service: Gastroenterology;;   HERNIA REPAIR     KNEE ARTHROSCOPY Right 1989   PORTA CATH INSERTION N/A 09/25/2022   Procedure: PORTA CATH INSERTION;  Surgeon: Annice Needy, MD;  Location: [facility] INVASIVE CV LAB;  Service: Cardiovascular;  Laterality: N/A;   VENTRAL HERNIA REPAIR N/A 10/22/2017   Procedure: OPEN REPAIR INCARCERATED VENTRAL HERNIA;  Surgeon:  Glenna Fellows, MD;  Location: [facility] OR;  Service: General;  Laterality: N/A;     FAMILY HISTORY   Family History  Problem Relation Age of Onset   Diabetes Mother    Stroke Mother    Arthritis Mother    Depression Mother    Heart disease Mother    Hypertension Mother    Learning disabilities Mother    Mental illness Mother    Sleep apnea Mother    Diabetes Father    Heart disease Father    Arthritis Father    Hearing loss Father    Hyperlipidemia Father    Heart attack Father    Hypertension Father    Stroke Father    Diabetes Sister    Depression Sister    Diabetes Sister    Hypertension Sister    Mental illness Sister    Sleep apnea Sister    Diabetes Maternal Grandmother    Heart disease Maternal Grandmother    Depression Maternal Grandmother    Hyperlipidemia Maternal Grandmother    Hypertension Maternal Grandmother    Stroke Maternal Grandmother    Hypertension Maternal Grandfather    Hyperlipidemia Maternal Grandfather    Stroke Maternal Grandfather    Arthritis Paternal Grandmother    Hearing loss Paternal Grandmother    Stroke Paternal Grandfather    Heart disease Paternal Grandfather    Arthritis Paternal Grandfather    Heart attack Paternal Grandfather    Melanoma Other      SOCIAL HISTORY   Social History   Tobacco Use   Smoking status: Never   Smokeless tobacco: Never  Vaping Use   Vaping status: Never Used  Substance Use Topics   Alcohol use: Yes    Comment: occasional beer   Drug use: Never     MEDICATIONS    Home Medication:     Current Medication:  Current Facility-Administered Medications:    albumin human 25 % solution 12.5 g, 12.5 g, Intravenous, Daily, Ermal Brzozowski, MD, Stopped at 05/02/23 1237   apixaban (ELIQUIS) tablet 5 mg, 5 mg, Oral, BID, Nazari, Walid A, RPH, 5 mg at 05/02/23 2258   atorvastatin (LIPITOR) tablet 40 mg, 40 mg, Oral, q1800, Wouk, Wilfred Curtis, MD, 40 mg at 05/02/23 1835   busPIRone (BUSPAR)  tablet 7.5 mg, 7.5 mg, Oral, BID, Wouk, Wilfred Curtis, MD, 7.5 mg at 05/02/23 2258   Chlorhexidine Gluconate Cloth 2 % PADS 6 each, 6 each, Topical, Daily, Wouk, Wilfred Curtis, MD   metoprolol tartrate (LOPRESSOR) tablet 12.5 mg, 12.5 mg, Oral, BID, Wouk, Wilfred Curtis, MD, 12.5 mg at 05/02/23 2256   midodrine (PROAMATINE) tablet 10 mg, 10 mg, Oral, TID WC, Lonie Rummell, MD, 10 mg at 05/02/23 1835   ondansetron (ZOFRAN-ODT) disintegrating tablet 4 mg, 4 mg, Oral, Q8H PRN, Wouk, Wilfred Curtis, MD, 4 mg at 05/03/23 4098   oxyCODONE (Oxy IR/ROXICODONE) immediate release tablet 5 mg, 5 mg, Oral, Q6H  PRN, Kathrynn Running, MD, 5 mg at 05/03/23 1610   sertraline (ZOLOFT) tablet 75 mg, 75 mg, Oral, Daily, Wouk, Wilfred Curtis, MD   sodium chloride flush (NS) 0.9 % injection 10-40 mL, 10-40 mL, Intracatheter, Q12H, Wouk, Wilfred Curtis, MD    ALLERGIES   Pollen extract and Sulfa antibiotics     REVIEW OF SYSTEMS    Review of Systems:  Gen:  Denies  fever, sweats, chills weigh loss  HEENT: Denies blurred vision, double vision, ear pain, eye pain, hearing loss, nose bleeds, sore throat Cardiac:  No dizziness, chest pain or heaviness, chest tightness,edema Resp:   reports dyspnea chronically  Gi: Denies swallowing difficulty, stomach pain, nausea or vomiting, diarrhea, constipation, bowel incontinence Gu:  Denies bladder incontinence, burning urine Ext:   Denies Joint pain, stiffness or swelling Skin: Denies  skin rash, easy bruising or bleeding or hives Endoc:  Denies polyuria, polydipsia , polyphagia or weight change Psych:   Denies depression, insomnia or hallucinations   Other:  All other systems negative   VS: BP (!) 133/99 (BP Location: Right Arm)   Pulse 64   Temp 97.6 F (36.4 C)   Resp 17   Ht 5\' 11"  (1.803 m)   Wt 106 kg   SpO2 96%   BMI 32.59 kg/m      PHYSICAL EXAM    GENERAL:NAD, no fevers, chills, no weakness no fatigue HEAD: Normocephalic, atraumatic.  EYES:  Pupils equal, round, reactive to light. Extraocular muscles intact. No scleral icterus.  MOUTH: Moist mucosal membrane. Dentition intact. No abscess noted.  EAR, NOSE, THROAT: Clear without exudates. No external lesions.  NECK: Supple. No thyromegaly. No nodules. No JVD.  PULMONARY: decreased breath sounds with mild rhonchi worse at bases bilaterally.  CARDIOVASCULAR: S1 and S2. Regular rate and rhythm. No murmurs, rubs, or gallops. No edema. Pedal pulses 2+ bilaterally.  GASTROINTESTINAL: Soft, nontender, nondistended. No masses. Positive bowel sounds. No hepatosplenomegaly.  MUSCULOSKELETAL: No swelling, clubbing, or edema. Range of motion full in all extremities.  NEUROLOGIC: Cranial nerves II through XII are intact. No gross focal neurological deficits. Sensation intact. Reflexes intact.  SKIN: No ulceration, lesions, rashes, or cyanosis. Skin warm and dry. Turgor intact.  PSYCHIATRIC: Mood, affect within normal limits. The patient is awake, alert and oriented x 3. Insight, judgment intact.       IMAGING   arrative & Impression  CLINICAL DATA:  History of gastric adenocarcinoma with nausea and vomiting. * Tracking Code: BO *   EXAM: CT ABDOMEN AND PELVIS WITHOUT CONTRAST   TECHNIQUE: Multidetector CT imaging of the abdomen and pelvis was performed following the standard protocol without IV contrast.   RADIATION DOSE REDUCTION: This exam was performed according to the departmental dose-optimization program which includes automated exposure control, adjustment of the mA and/or kV according to patient size and/or use of iterative reconstruction technique.   COMPARISON:  CT abdomen and pelvis dated 04/10/2023, CTA chest dated 04/30/2023, nuclear medicine PET dated 12/31/2022   FINDINGS: Lower chest: Partially imaged central venous catheter terminates at the superior cavoatrial junction. Re-expansion of partially imaged lingula and left lower lobe status post thoracentesis.  Diffuse patchy ground-glass opacities of the lingula and partially imaged left lower lobe. Similar large right and decreased moderate left pleural effusions. No pneumothorax. Similar pericardial effusion. Coronary artery calcifications.   Hepatobiliary: No focal hepatic lesions. No intra or extrahepatic biliary ductal dilation. Contracted gallbladder demonstrates cholelithiasis. Adrenals   Pancreas: No focal lesions or main ductal dilation.  Spleen: Normal in size without focal abnormality.   Adrenals/Urinary Tract: Unchanged 1.3 cm left adrenal nodule measures 13 HU, not previously FDG-avid, likely adenoma. No specific follow-up imaging recommended. No right adrenal nodule. Mild left hydronephrosis contains excreted renal contrast material from recent CTA chest. Contrast is seen to the level of the proximal ureter, which demonstrates mild circumferential mural thickening with adjacent soft tissue irregularity (2:52), inseparable from the anterior psoas. Excreted contrast material within the urinary bladder.   Stomach/Bowel: Decreased conspicuity of mural thickening involving the gastric cardia. No evidence of bowel wall thickening, distention, or inflammatory changes. Normal appendix, which is contained within the hernia.   Vascular/Lymphatic: Aortic atherosclerosis. Partially imaged paraesophageal lymph nodes measuring up to 9 mm (2:2) are unchanged. Continued decrease in size of perigastric node measuring 2.2 x 2.0 cm (2:34), previously 3.7 x 2.1 cm. Interval increased multi station periportal, retroaortic, and pelvic lymphadenopathy, for example   -14 mm portacaval (2:33), previously 13 mm   -10 mm left para-aortic (2:39), previously 5 mm   -14 mm right external iliac (2:74), previously 12 mm   Reproductive: Prostate is unremarkable.   Other: Trace ascites.  No free air or fluid collection.   Musculoskeletal: No acute or abnormal lytic or blastic osseous lesions.  Multilevel degenerative changes of the partially imaged thoracic and lumbar spine. Large ventral midline lower abdominal wall hernia containing nonobstructed loops of small and large bowel, as before.   IMPRESSION: 1. Mild left hydronephrosis contains excreted renal contrast material from recent CTA chest, suggestive of delayed nephrogram. Contrast is seen to the level of the proximal ureter, which demonstrates mild circumferential mural thickening with adjacent soft tissue irregularity, inseparable from the anterior psoas. Findings are suspicious for ureteral involvement by metastatic disease, less likely ascending urinary tract infection. Recommend correlation with urinalysis. 2. Interval increased multi station periportal, retroaortic, and pelvic lymphadenopathy, indeterminate, however suspicious for metastatic disease. 3. Decreased conspicuity of mural thickening involving the gastric cardia. Decreased size of perigastric nodule. 4. Re-expansion of partially imaged lingula and left lower lobe status post thoracentesis. Diffuse patchy ground-glass opacities of the lingula and partially imaged left lower lobe, which may represent re-expansion pulmonary edema. 5. Similar large right and decreased moderate left pleural effusions. 6. Similar pericardial effusion. 7. Aortic Atherosclerosis (ICD10-I70.0). Coronary artery calcifications. Assessment for potential risk factor modification, dietary therapy or pharmacologic therapy may be warranted, if clinically indicated.     Electronically Signed   By: Agustin Cree M.D.   On: 05/01/2023 15:51    ASSESSMENT/PLAN   Bilateral pleural effusions and Anasarca  -Patient has multiple possible etiologies of fluid retention, including CKD, liver dysfunction and cancer  - Pleural fluid profile monocyte predominant exudate indicative of chronic effusion.  He had clear yellow fluid 1.5L drawn from left pleural space. Generally malignant effusion  is neutrophil predominant acidic bloody exudate. The fluid here is likely  due to renal impairment with concomitant liver cirrosis. There is already cytology in process to rule out malignant effusion  - at this time I recommend diuresis with dual diuretic to reduce electorlyte imbalance -recommnedation for aggressive diuresis  - will continue to follow and monitor as more data is available  - nephrology consultation in process -currently receiving albumin IV   Stage 4 Gastric carcinoma   - oncology on case appreciate input   - palliative on case appreciate input   Possible chemo per onc    Chronic diastolic CHF with Atrial fibrillation     - currenlty  not in exacerbation    - continue lopressor and eliquis       Thank you for allowing me to participate in the care of this patient.   Patient/Family are satisfied with care plan and all questions have been answered.    Provider disclosure: Patient with at least one acute or chronic illness or injury that poses a threat to life or bodily function and is being managed actively during this encounter.  All of the below services have been performed independently by signing provider:  review of prior documentation from internal and or external health records.  Review of previous and current lab results.  Interview and comprehensive assessment during patient visit today. Review of current and previous chest radiographs/CT scans. Discussion of management and test interpretation with health care team and patient/family.   This document was prepared using Dragon voice recognition software and may include unintentional dictation errors.     Vida Rigger, M.D.  Division of Pulmonary & Critical Care Medicine

## 2023-05-03 NOTE — Progress Notes (Signed)
Notified by telemetry technician around 0500 that pt HR was elevated in the 140's. Upon entering the room the pt was standing at the foot of his bed, in the dark, facing the wall. 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 His bed alarm had been turned off at some point and he got himself up to the bathroom to void. His oxygen came off the wall at that point because it wouldn't reach. He was very SOB, using accessory muscles to breath, and in severe 10/10 generalized pain, mostly back and legs and buttocks. Assisted him back to bed, turned oxygen up to 3 L via nasal cannula. PRN oxy administered for pain. VSS. Bed alarm on and side rails up. Due to his vision he has not been able to use the call bell, however he was told just to yell out if he needed help, and he did not. He does not try to make his needs knows and wants to do things very independently.

## 2023-05-03 NOTE — Progress Notes (Signed)
Contacted on call provider via secure chat with the message as follows:  This pt was admitted this afternoon and he does have a Rt upper chest mediport. Wondering if we could have permission to access.  35 mins Manuela Schwartz, NP WHAT ARE THEY ADMITTED FOR  33 mins BM SORRY ABOUT THE CAPS  21 mins Sorry. He was admitted with persistent pleural effusion and had LLL thoracentesis and drained 1.5L of fluid. CHF with recovered EF, Recurrent CVA. He has stage 4 gastric adenocarcinoma. He has been running A-fib and A-flutter with occasional PVC's and PAC's, IRR and at one point elevated to the 130's but didn't sustain. He had his HS metoprolol. He has Cirrhosis, normocytic anemia. He was here 2 days ago for CHF, was diuresed and sent home.   3 mins BM Manuela Schwartz, NP yes i thinki using the port should be fine  1 min When I got him up to the bathroom he became very dyspnic and oxygen was 81% RA, which increased after sitting for a few minutes. 2L [place] applied. I think I read he did the same in the ER.   1  1 min BM Manuela Schwartz, NP order th access port is in

## 2023-05-03 NOTE — Progress Notes (Signed)
[facility]  -  at [facility]   PATIENT NAME: Jeremiah Vance    MR#:  [id number]  DATE OF BIRTH:  [birth year]  SUBJECTIVE:  patient sister [name] at bedside. Updated. Sister became tearful. Patient laying in bed comfortably. Listening to our conversation. Asked him to collect his urine and the urine all which is at bedside. He is drinking his chocolate shake brought my family. Denies any shortness of breath or pain    VITALS:  Blood pressure (!) 153/105, pulse 83, temperature 98 F (36.7 C), temperature source Oral, resp. rate 18, height 5\' 11"  (1.803 m), weight 106 kg, SpO2 97%.  PHYSICAL EXAMINATION:   GENERAL:  53 y.o.-year-old patient with no acute distress. Deconditioned chronically ill LUNGS: decreased breath sounds bilaterally, no wheezing CARDIOVASCULAR: S1, S2 normal. No murmur   ABDOMEN: Soft, nontender, nondistended. Bowel sounds present.  EXTREMITIES: ++ No  edema b/l.    NEUROLOGIC: nonfocal  patient is alert and awake SKIN: per RN  LABORATORY PANEL:  CBC Recent Labs  Lab 05/02/23 1054  WBC 7.0  HGB 10.6*  HCT 33.2*  PLT 228    Chemistries  Recent Labs  Lab 05/01/23 0326 05/02/23 1054 05/03/23 0540  NA 135   < > 133*  K 4.8   < > 4.5  CL 103   < > 99  CO2 20*   < > 17*  GLUCOSE 114*   < > 138*  BUN 62*   < > 87*  CREATININE 2.84*   < > 4.09*  CALCIUM 8.6*   < > 8.9  AST 30  --   --   ALT 22  --   --   ALKPHOS 143*  --   --   BILITOT 0.9  --   --    < > = values in this interval not displayed.   Assessment and Plan  Jeremiah Vance is a 53 y.o. male with medical history significant for obesity, recurrent cva, HFrecoveredEF, htn, ckd 3b, Type-2 dm, stage 4 gastric adenocarcinoma, osa, cirrhosis, a fib, who presents with the above.   Discharged 2 days ago, was admitted for chf exacerbation, was diuresed.   Reports chest pain, non-exertional, yesterday afternoon that led him to contact EMS. Chest pain resolved. Also  reports dyspnea. No fever, has mild non-productive cough. Reports compliance with home meds last couple of days, is making urine. Stable cancer-related pain, no vomiting or diarrhea.   Pleural effusion left-sided chronic but worsening -- status post thoracentesis with 1.5 L fluid removed no malignancy seen on cytology -- holding diuresis secondary to elevated creatinine -- followed by pulmonary. Consider outpatient pleural X catheter refill current  Pericardial effusion -- moderate, previously mild. -- Echo does not show signs of temper not -- [facility] cardiology advises no additional workup at the time being  Acute hypoxic respiratory failure secondary to pleural effusion and history of CHF -- CTA no PE -- shortness of breath improved with thoracentesis -- continue your oxygen likely will need at discharge  Chronic congestive heart failure systolic history of atrial fibrillation -- Echo recent showed EF of 40 to 45%, moderate LVH -- holding torsemide,Losartan, farxiga due to elevated creatinine -- on beta-blocker -- appreciate [facility] cardiology input -- patient on eliquis  Stage 4 gastric adenocarcinoma Prognosis appears poor given underlying worsening comorbidities, poor functional status. Dr. Cathie Hoops and Mr. Windy Fast have seen. Chemo will need to be paused until other medical conditions stable. Family not ready for hospice  yet.     Hx CVA Multiple, with aphasia. Patient/sister report no new symptoms - cont home apixaban, statin   AKI CKD 3b suspected due to meds and IV contrast --. CT does show possible metastatic left ureteral involvement but advises correlcation with urinalysis. He did get IV contrast on 1/29. - hold diuresis - hold losartan - continue albumin -nephrology on board. -- Creatinine trend 2.8-- 3.52-- 4.09   GAD - home buspar, sertraline    Chronic pain - home oxy code on    HTN Bps low here - pulm has started midodrine   Cirrhosis Appears relatively  compensated. Trace ascites seen on CT earlier this month. No report of bleeding  T2DM Well controlled, only on farxiga-- on hold - daily fasting sugars for now   OSA Not on cpap    Debility PT consult pending, family is hesitant to take patient home  Patient overall has poor prognosis. Discussed with patient's sister [name] at bedside.    Procedures: Family communication : sister [name] Consults : oncology, nephrology, cardiology CODE STATUS: DNR DVT Prophylaxis : eliquis Level of care: Med-Surg Status is: Inpatient Remains inpatient appropriate because: pleural effusion, AK I    TOTAL TIME TAKING CARE OF THIS PATIENT: 45 minutes.  >50% time spent on counselling and coordination of care  Note: This dictation was prepared with Dragon dictation along with smaller phrase technology. Any transcriptional errors that result from this process are unintentional.  Enedina Finner M.D    [facility]   CC: Primary care physician; Debera Lat, PA-C

## 2023-05-03 NOTE — Progress Notes (Signed)
Contacted on call provider via secure chat as follows:  Hello again. This patient started with dry heaving before I left this morning and I gave him the sublingual zofran, he stated in was minimally effective. He has not been eating and is now dry heaving again, gave him zofran SL, its minimally effective. Can I have an order to administer it IV please?  26 mins BM Manuela Schwartz, NP i ordered a dose of compazine since he just had a dose of the compazine

## 2023-05-03 NOTE — TOC Progression Note (Signed)
Transition of Care [facility]: [facility] Rhonda W) - Progression Note    Patient Details  Name: Jeremiah Vance MRN: [id number] Date of Birth: 07-10-1970  Transition of Care [facility]) CM/SW Contact  Bing Quarry, RN Phone Number: 05/03/2023, 12:56 PM  Clinical Narrative:   2/1: Patient was recently discharged on 04/28/23.  Returned to ED by [facility] from home. C/o left sided chest pain. Worsening with pressure after bending over. A&O x4 per EMS per triage notes. Was discharged to home last admission without HH due to no agency accepting insurance. Dx'd in ED with pleural effusion and now s/p thoracentesis on 1/29. On Room air around noon today per flowsheet. Currently undergoing cancer treatments per notes for GE junction tage 4 gastric adenocarcinoma. Cardiology, pulmonary, and oncology consults completed.   Per PT evaluation on 1/31: PLOF: "History of Falls (last six months) Mobility Comments: mod I for stair navigation, ambulation without AD (PRN support from walls/furniture), PRN use of AD with community ambulation. ADLs Comments: IND with ADL's; sister assists with IADL's, transportation, and set up assist for medication management"  If HH recommended by PT evaluation, outpatient therapy may need consideration due to insurance barriers for Home Health services. TOC to follow for discharge planning.     Gabriel Cirri MSN RN CM  RN Case Manager [place]  Transitions of Care Direct Dial: [phone] (Weekends Only) [facility] Main Office Phone: [phone] [facility] Fax: [phone] [email or web address]     Expected Discharge Plan: Home/Self Care Barriers to Discharge: Continued Medical Work up  Expected Discharge Plan and Services       Living arrangements for the past 2 months: Single Family Home                                       Social Determinants of Health (SDOH) Interventions SDOH Screenings   Food Insecurity: No Food Insecurity (04/30/2023)  Housing: Low Risk  (04/30/2023)  Transportation Needs:  No Transportation Needs (04/30/2023)  Utilities: Not At Risk (04/30/2023)  Alcohol Screen: Low Risk  (11/28/2021)  Depression (PHQ2-9): Medium Risk (02/20/2023)  Financial Resource Strain: Low Risk  (04/24/2023)  Social Connections: Moderately Isolated (04/30/2023)  Tobacco Use: Low Risk  (04/30/2023)    Readmission Risk Interventions    09/29/2022   10:04 AM  Readmission Risk Prevention Plan  Transportation Screening Complete  Medication Review (RN Care Manager) Complete  PCP or Specialist appointment within 3-5 days of discharge Complete  HRI or Home Care Consult Complete  SW Recovery Care/Counseling Consult Complete  Palliative Care Screening Not Applicable  Skilled Nursing Facility Not Applicable

## 2023-05-04 ENCOUNTER — Inpatient Hospital Stay: Payer: Medicaid Other

## 2023-05-04 DIAGNOSIS — C169 Malignant neoplasm of stomach, unspecified: Secondary | ICD-10-CM | POA: Diagnosis not present

## 2023-05-04 DIAGNOSIS — I509 Heart failure, unspecified: Secondary | ICD-10-CM | POA: Diagnosis not present

## 2023-05-04 DIAGNOSIS — E875 Hyperkalemia: Secondary | ICD-10-CM | POA: Diagnosis not present

## 2023-05-04 DIAGNOSIS — I3139 Other pericardial effusion (noninflammatory): Secondary | ICD-10-CM | POA: Diagnosis not present

## 2023-05-04 DIAGNOSIS — I517 Cardiomegaly: Secondary | ICD-10-CM | POA: Diagnosis not present

## 2023-05-04 DIAGNOSIS — I4821 Permanent atrial fibrillation: Secondary | ICD-10-CM | POA: Diagnosis not present

## 2023-05-04 DIAGNOSIS — N179 Acute kidney failure, unspecified: Secondary | ICD-10-CM | POA: Diagnosis not present

## 2023-05-04 DIAGNOSIS — N1330 Unspecified hydronephrosis: Secondary | ICD-10-CM | POA: Diagnosis not present

## 2023-05-04 DIAGNOSIS — J90 Pleural effusion, not elsewhere classified: Secondary | ICD-10-CM | POA: Diagnosis not present

## 2023-05-04 LAB — GLUCOSE, CAPILLARY
Glucose-Capillary: 125 mg/dL — ABNORMAL HIGH (ref 70–99)
Glucose-Capillary: 126 mg/dL — ABNORMAL HIGH (ref 70–99)

## 2023-05-04 LAB — BASIC METABOLIC PANEL
Anion gap: 15 (ref 5–15)
BUN: 96 mg/dL — ABNORMAL HIGH (ref 6–20)
CO2: 18 mmol/L — ABNORMAL LOW (ref 22–32)
Calcium: 9 mg/dL (ref 8.9–10.3)
Chloride: 100 mmol/L (ref 98–111)
Creatinine, Ser: 5.09 mg/dL — ABNORMAL HIGH (ref 0.61–1.24)
GFR, Estimated: 13 mL/min — ABNORMAL LOW (ref >60)
Glucose, Bld: 135 mg/dL — ABNORMAL HIGH (ref 70–99)
Potassium: 5.4 mmol/L — ABNORMAL HIGH (ref 3.5–5.1)
Sodium: 133 mmol/L — ABNORMAL LOW (ref 135–145)

## 2023-05-04 MED ORDER — SODIUM ZIRCONIUM CYCLOSILICATE 10 G PO PACK
10.0000 g | PACK | Freq: Once | ORAL | Status: AC
  Administered 2023-05-04: 10 g via ORAL
  Filled 2023-05-04: qty 1

## 2023-05-04 MED ORDER — MIDODRINE HCL 5 MG PO TABS
10.0000 mg | ORAL_TABLET | Freq: Three times a day (TID) | ORAL | Status: DC | PRN
  Administered 2023-05-05: 10 mg via ORAL

## 2023-05-04 MED ORDER — ONDANSETRON HCL 4 MG/2ML IJ SOLN
4.0000 mg | Freq: Four times a day (QID) | INTRAMUSCULAR | Status: DC | PRN
  Administered 2023-05-04 – 2023-05-05 (×2): 4 mg via INTRAVENOUS
  Filled 2023-05-04 (×2): qty 2

## 2023-05-04 NOTE — Plan of Care (Signed)

## 2023-05-04 NOTE — Progress Notes (Signed)
PULMONOLOGY         Date: 05/04/2023,   MRN# [id number] Jeremiah Vance January 21, 1971     AdmissionWeight: 106 kg                 CurrentWeight: 106 kg   CHIEF COMPLAINT:   Recurrent pleural effusion and anasarca   HISTORY OF PRESENT ILLNESS   This is a 53 y.o. male with medical history significant of GE junction tage 4 gastric adenocarcinoma, osa, cirrhosis, on chemotherapy, HTN, HLD, DM, dCHF,  stroke with mild left sided weakness, depression with anxiety, A-fib on Eliquis, obesity, who presents with SOB and anasarca. He was dcd with similar issues few days ago. He is reporting chest discomfort. He is noted to have severe LE edema, gut edema and decreased breath sounds. He had CT chest with findings of b/l pleural effusions and pericardial effusions.  He had thoracentesis done. PCCM consultation for further evaluation and management.    05/03/23- patient seen with sister at bedside. He appears less edematous.  He is not in distress.  His renal function appears to be worse.  He does have nephrology consultation in process.  We discussed pleural fluid profile and it is not consistent with malignant effusion. He is lucid and appropriate.   05/04/23- patient in no acute distress.  He remains in anasarca.  Low dose diuresis started by nephrology.  Discussion regarding HD. Urology consult ordered noted L hydronephrosis.  Pulmonary status is stable.  Patient sitting up with mild distress, have ordered CXR for interval changes on pleural fluid.   PAST MEDICAL HISTORY   Past Medical History:  Diagnosis Date   Acute ischemic stroke (HCC) 09/06/2017   Acute renal failure superimposed on stage 3a chronic kidney disease (HCC) 07/08/2019   Acute right-sided weakness    Allergies    Anasarca    Anemia 07/10/2017   Arthritis    Cancer (HCC)    stomach and esophageal cancer stage 4   Chicken pox    CKD (chronic kidney disease) stage 3, GFR 30-59 ml/min (HCC) 06/2017   Depression     Elevated troponin 07/08/2019   Heart failure with mid-range ejection fraction (HCC)    a. 06/2017 Echo: EF 40-45%; b. 07/2019 Echo: EF 50-55%; c. 10/2021 Echo: EF 30-35%; d. 10/2022 Echo: EF 55-60%; e. 04/2023 Echo: EF 40-45%, glob HK, mod LVH, mildly reduced RV fxn, mod dil LA, mild MR, Ao root 41mm, asc Ao 42mm.   High cholesterol    History of cardiomyopathy    LVEF 40 to 45% in April 2019 - subsequently normalized   Hyperbilirubinemia 07/10/2017   Hypertension    Ischemic stroke (HCC)    Small left internal capsule infarct due to lacunar disease   Morbid obesity (HCC)    NICM (nonischemic cardiomyopathy) (HCC)    a. 06/2017 Echo: EF 40-45%; b. 07/2019 Echo: EF 50-55%; c. 10/2021 Echo: EF 30-35%; d. 12/2021 MV: EF 60%, no ischemia/scar; e. 10/2022 Echo: EF 55-60%; f. 04/2023 Echo: EF 40-45%.   Normocytic anemia 12/16/2017   Permanent atrial fibrillation (HCC)    a. CHA2DS2VASc = 5-->eliquis.   Recurrent incisional hernia with incarceration s/p repair 10/22/2017 10/21/2017   Stroke (HCC) 08/2017   "right sided weakness since; getting stronger though" (10/22/2017)   Type 2 diabetes mellitus [facility])      SURGICAL HISTORY   Past Surgical History:  Procedure Laterality Date   ABDOMINAL HERNIA REPAIR  2008; 10/22/2017   "scope; OPEN REPAIR INCARCERATED  VENTRAL HERNIA   ESOPHAGOGASTRODUODENOSCOPY (EGD) WITH PROPOFOL N/A 09/10/2022   Procedure: ESOPHAGOGASTRODUODENOSCOPY (EGD) WITH PROPOFOL;  Surgeon: Toney Reil, MD;  Location: [facility] ENDOSCOPY;  Service: Gastroenterology;  Laterality: N/A;   ESOPHAGOGASTRODUODENOSCOPY (EGD) WITH PROPOFOL N/A 09/28/2022   Procedure: ESOPHAGOGASTRODUODENOSCOPY (EGD) WITH PROPOFOL;  Surgeon: Jaynie Collins, DO;  Location: [facility] ENDOSCOPY;  Service: Gastroenterology;  Laterality: N/A;   HEMOSTASIS CONTROL  09/28/2022   Procedure: HEMOSTASIS CONTROL;  Surgeon: Jaynie Collins, DO;  Location: [facility] ENDOSCOPY;  Service: Gastroenterology;;   HERNIA REPAIR      KNEE ARTHROSCOPY Right 1989   PORTA CATH INSERTION N/A 09/25/2022   Procedure: PORTA CATH INSERTION;  Surgeon: Annice Needy, MD;  Location: [facility] INVASIVE CV LAB;  Service: Cardiovascular;  Laterality: N/A;   VENTRAL HERNIA REPAIR N/A 10/22/2017   Procedure: OPEN REPAIR INCARCERATED VENTRAL HERNIA;  Surgeon: Glenna Fellows, MD;  Location: [facility] OR;  Service: General;  Laterality: N/A;     FAMILY HISTORY   Family History  Problem Relation Age of Onset   Diabetes Mother    Stroke Mother    Arthritis Mother    Depression Mother    Heart disease Mother    Hypertension Mother    Learning disabilities Mother    Mental illness Mother    Sleep apnea Mother    Diabetes Father    Heart disease Father    Arthritis Father    Hearing loss Father    Hyperlipidemia Father    Heart attack Father    Hypertension Father    Stroke Father    Diabetes Sister    Depression Sister    Diabetes Sister    Hypertension Sister    Mental illness Sister    Sleep apnea Sister    Diabetes Maternal Grandmother    Heart disease Maternal Grandmother    Depression Maternal Grandmother    Hyperlipidemia Maternal Grandmother    Hypertension Maternal Grandmother    Stroke Maternal Grandmother    Hypertension Maternal Grandfather    Hyperlipidemia Maternal Grandfather    Stroke Maternal Grandfather    Arthritis Paternal Grandmother    Hearing loss Paternal Grandmother    Stroke Paternal Grandfather    Heart disease Paternal Grandfather    Arthritis Paternal Grandfather    Heart attack Paternal Grandfather    Melanoma Other      SOCIAL HISTORY   Social History   Tobacco Use   Smoking status: Never   Smokeless tobacco: Never  Vaping Use   Vaping status: Never Used  Substance Use Topics   Alcohol use: Yes    Comment: occasional beer   Drug use: Never     MEDICATIONS     Current Medication:  Current Facility-Administered Medications:    albumin human 25 % solution 12.5 g, 12.5 g,  Intravenous, Daily, Mitsuye Schrodt, MD, Last Rate: 60 mL/hr at 05/04/23 0935, 12.5 g at 05/04/23 0935   apixaban (ELIQUIS) tablet 5 mg, 5 mg, Oral, BID, Nazari, Walid A, RPH, 5 mg at 05/04/23 0930   atorvastatin (LIPITOR) tablet 40 mg, 40 mg, Oral, q1800, Wouk, Wilfred Curtis, MD, 40 mg at 05/03/23 1752   busPIRone (BUSPAR) tablet 7.5 mg, 7.5 mg, Oral, BID, Wouk, Wilfred Curtis, MD, 7.5 mg at 05/04/23 0930   Chlorhexidine Gluconate Cloth 2 % PADS 6 each, 6 each, Topical, Daily, Wouk, Wilfred Curtis, MD, 6 each at 05/04/23 0936   guaiFENesin-dextromethorphan (ROBITUSSIN DM) 100-10 MG/5ML syrup 10 mL, 10 mL, Oral, Q4H PRN, Enedina Finner, MD  lip balm (BLISTEX) ointment, , Topical, PRN, Hunt, Madison H, RPH   loratadine (CLARITIN) tablet 10 mg, 10 mg, Oral, Daily PRN, Enedina Finner, MD   metoprolol tartrate (LOPRESSOR) tablet 12.5 mg, 12.5 mg, Oral, BID, Wouk, Wilfred Curtis, MD, 12.5 mg at 05/04/23 0930   midodrine (PROAMATINE) tablet 10 mg, 10 mg, Oral, TID PRN, Enedina Finner, MD   Muscle Rub CREA 1 Application, 1 Application, Topical, PRN, Enedina Finner, MD   ondansetron (ZOFRAN-ODT) disintegrating tablet 4 mg, 4 mg, Oral, Q8H PRN, Wouk, Wilfred Curtis, MD, 4 mg at 05/03/23 2048   oxyCODONE (Oxy IR/ROXICODONE) immediate release tablet 5 mg, 5 mg, Oral, Q6H PRN, Ashok Pall, Wilfred Curtis, MD, 5 mg at 05/03/23 2048   polyvinyl alcohol (LIQUIFILM TEARS) 1.4 % ophthalmic solution 1 drop, 1 drop, Both Eyes, PRN, Enedina Finner, MD   sertraline (ZOLOFT) tablet 75 mg, 75 mg, Oral, Daily, Wouk, Wilfred Curtis, MD, 75 mg at 05/04/23 0930   sodium chloride flush (NS) 0.9 % injection 10-40 mL, 10-40 mL, Intracatheter, Q12H, Wouk, Wilfred Curtis, MD, 10 mL at 05/04/23 1308    ALLERGIES   Pollen extract and Sulfa antibiotics     REVIEW OF SYSTEMS    Review of Systems:  Gen:  Denies  fever, sweats, chills weigh loss  HEENT: Denies blurred vision, double vision, ear pain, eye pain, hearing loss, nose bleeds, sore  throat Cardiac:  No dizziness, chest pain or heaviness, chest tightness,edema Resp:   reports dyspnea chronically  Gi: Denies swallowing difficulty, stomach pain, nausea or vomiting, diarrhea, constipation, bowel incontinence Gu:  Denies bladder incontinence, burning urine Ext:   Denies Joint pain, stiffness or swelling Skin: Denies  skin rash, easy bruising or bleeding or hives Endoc:  Denies polyuria, polydipsia , polyphagia or weight change Psych:   Denies depression, insomnia or hallucinations   Other:  All other systems negative   VS: BP (!) 128/92 (BP Location: Left Arm)   Pulse (!) 117   Temp 97.8 F (36.6 C) (Oral)   Resp 16   Ht 5\' 11"  (1.803 m)   Wt 106 kg   SpO2 90%   BMI 32.59 kg/m      PHYSICAL EXAM    GENERAL:NAD, no fevers, chills, no weakness no fatigue HEAD: Normocephalic, atraumatic.  EYES: Pupils equal, round, reactive to light. Extraocular muscles intact. No scleral icterus.  MOUTH: Moist mucosal membrane. Dentition intact. No abscess noted.  EAR, NOSE, THROAT: Clear without exudates. No external lesions.  NECK: Supple. No thyromegaly. No nodules. No JVD.  PULMONARY: decreased breath sounds with mild rhonchi worse at bases bilaterally.  CARDIOVASCULAR: S1 and S2. Regular rate and rhythm. No murmurs, rubs, or gallops. No edema. Pedal pulses 2+ bilaterally.  GASTROINTESTINAL: Soft, nontender, nondistended. No masses. Positive bowel sounds. No hepatosplenomegaly.  MUSCULOSKELETAL: No swelling, clubbing, or edema. Range of motion full in all extremities.  NEUROLOGIC: Cranial nerves II through XII are intact. No gross focal neurological deficits. Sensation intact. Reflexes intact.  SKIN: No ulceration, lesions, rashes, or cyanosis. Skin warm and dry. Turgor intact.  PSYCHIATRIC: Mood, affect within normal limits. The patient is awake, alert and oriented x 3. Insight, judgment intact.       IMAGING   arrative & Impression  CLINICAL DATA:  History of  gastric adenocarcinoma with nausea and vomiting. * Tracking Code: BO *   EXAM: CT ABDOMEN AND PELVIS WITHOUT CONTRAST   TECHNIQUE: Multidetector CT imaging of the abdomen and pelvis was performed following the standard protocol without IV  contrast.   RADIATION DOSE REDUCTION: This exam was performed according to the departmental dose-optimization program which includes automated exposure control, adjustment of the mA and/or kV according to patient size and/or use of iterative reconstruction technique.   COMPARISON:  CT abdomen and pelvis dated 04/10/2023, CTA chest dated 04/30/2023, nuclear medicine PET dated 12/31/2022   FINDINGS: Lower chest: Partially imaged central venous catheter terminates at the superior cavoatrial junction. Re-expansion of partially imaged lingula and left lower lobe status post thoracentesis. Diffuse patchy ground-glass opacities of the lingula and partially imaged left lower lobe. Similar large right and decreased moderate left pleural effusions. No pneumothorax. Similar pericardial effusion. Coronary artery calcifications.   Hepatobiliary: No focal hepatic lesions. No intra or extrahepatic biliary ductal dilation. Contracted gallbladder demonstrates cholelithiasis. Adrenals   Pancreas: No focal lesions or main ductal dilation.   Spleen: Normal in size without focal abnormality.   Adrenals/Urinary Tract: Unchanged 1.3 cm left adrenal nodule measures 13 HU, not previously FDG-avid, likely adenoma. No specific follow-up imaging recommended. No right adrenal nodule. Mild left hydronephrosis contains excreted renal contrast material from recent CTA chest. Contrast is seen to the level of the proximal ureter, which demonstrates mild circumferential mural thickening with adjacent soft tissue irregularity (2:52), inseparable from the anterior psoas. Excreted contrast material within the urinary bladder.   Stomach/Bowel: Decreased conspicuity of mural  thickening involving the gastric cardia. No evidence of bowel wall thickening, distention, or inflammatory changes. Normal appendix, which is contained within the hernia.   Vascular/Lymphatic: Aortic atherosclerosis. Partially imaged paraesophageal lymph nodes measuring up to 9 mm (2:2) are unchanged. Continued decrease in size of perigastric node measuring 2.2 x 2.0 cm (2:34), previously 3.7 x 2.1 cm. Interval increased multi station periportal, retroaortic, and pelvic lymphadenopathy, for example   -14 mm portacaval (2:33), previously 13 mm   -10 mm left para-aortic (2:39), previously 5 mm   -14 mm right external iliac (2:74), previously 12 mm   Reproductive: Prostate is unremarkable.   Other: Trace ascites.  No free air or fluid collection.   Musculoskeletal: No acute or abnormal lytic or blastic osseous lesions. Multilevel degenerative changes of the partially imaged thoracic and lumbar spine. Large ventral midline lower abdominal wall hernia containing nonobstructed loops of small and large bowel, as before.   IMPRESSION: 1. Mild left hydronephrosis contains excreted renal contrast material from recent CTA chest, suggestive of delayed nephrogram. Contrast is seen to the level of the proximal ureter, which demonstrates mild circumferential mural thickening with adjacent soft tissue irregularity, inseparable from the anterior psoas. Findings are suspicious for ureteral involvement by metastatic disease, less likely ascending urinary tract infection. Recommend correlation with urinalysis. 2. Interval increased multi station periportal, retroaortic, and pelvic lymphadenopathy, indeterminate, however suspicious for metastatic disease. 3. Decreased conspicuity of mural thickening involving the gastric cardia. Decreased size of perigastric nodule. 4. Re-expansion of partially imaged lingula and left lower lobe status post thoracentesis. Diffuse patchy ground-glass opacities  of the lingula and partially imaged left lower lobe, which may represent re-expansion pulmonary edema. 5. Similar large right and decreased moderate left pleural effusions. 6. Similar pericardial effusion. 7. Aortic Atherosclerosis (ICD10-I70.0). Coronary artery calcifications. Assessment for potential risk factor modification, dietary therapy or pharmacologic therapy may be warranted, if clinically indicated.     Electronically Signed   By: Agustin Cree M.D.   On: 05/01/2023 15:51    ASSESSMENT/PLAN   Bilateral pleural effusions and Anasarca  -ESRD with hydronephrosis and liver cirrosis  - Pleural fluid profile monocyte  predominant exudate indicative of chronic effusion.  He had clear yellow fluid 1.5L drawn from left pleural space. Generally malignant effusion is neutrophil predominant acidic bloody exudate. The fluid here is likely  due to renal impairment with concomitant liver cirrosis. -cytology negative for malignancy  - will continue to follow and monitor as more data is available  - nephrology and urology consultation in process    Stage 4 Gastric carcinoma   - oncology on case appreciate input   - palliative on case appreciate input   Possible chemo per onc    Chronic diastolic CHF with Atrial fibrillation     - currenlty not in exacerbation    - continue lopressor and eliquis       Thank you for allowing me to participate in the care of this patient.   Patient/Family are satisfied with care plan and all questions have been answered.    Provider disclosure: Patient with at least one acute or chronic illness or injury that poses a threat to life or bodily function and is being managed actively during this encounter.  All of the below services have been performed independently by signing provider:  review of prior documentation from internal and or external health records.  Review of previous and current lab results.  Interview and comprehensive assessment during  patient visit today. Review of current and previous chest radiographs/CT scans. Discussion of management and test interpretation with health care team and patient/family.   This document was prepared using Dragon voice recognition software and may include unintentional dictation errors.     Vida Rigger, M.D.  Division of Pulmonary & Critical Care Medicine

## 2023-05-04 NOTE — Progress Notes (Signed)
[facility]  PROGRESS NOTE   Subjective:   Patient seen at bedside.  Family is in attendance.  Patient able to communicate and understand his overall health.  Objective:  Vital signs: Blood pressure (!) 128/92, pulse (!) 117, temperature 97.8 F (36.6 C), temperature source Oral, resp. rate 16, height 5\' 11"  (1.803 m), weight 106 kg, SpO2 90%.  Intake/Output Summary (Last 24 hours) at 05/04/2023 1211 Last data filed at 05/03/2023 1848 Gross per 24 hour  Intake --  Output 100 ml  Net -100 ml   Filed Weights   04/29/23 1659  Weight: 106 kg     Physical Exam: General:  No acute distress  Head:  Normocephalic, atraumatic. Moist oral mucosal membranes  Eyes:  Anicteric  Neck:  Supple  Lungs:   Clear to auscultation, normal effort  Heart:  S1S2 no rubs  Abdomen:   Soft, nontender, bowel sounds present  Extremities:  peripheral edema.  Neurologic:  Awake, alert, following commands  Skin:  No lesions  Access:     Basic Metabolic Panel: Recent Labs  Lab 04/29/23 1655 05/01/23 0326 05/02/23 1054 05/03/23 0540 05/04/23 0337  NA 137 135 132* 133* 133*  K 4.4 4.8 4.5 4.5 5.4*  CL 102 103 101 99 100  CO2 20* 20* 19* 17* 18*  GLUCOSE 156* 114* 165* 138* 135*  BUN 54* 62* 78* 87* 96*  CREATININE 2.36* 2.84* 3.52* 4.09* 5.09*  CALCIUM 8.6* 8.6* 8.6* 8.9 9.0   GFR: Estimated Creatinine Clearance: 20.8 mL/min (A) (by C-G formula based on SCr of 5.09 mg/dL (H)).  Liver Function Tests: Recent Labs  Lab 05/01/23 0326  AST 30  ALT 22  ALKPHOS 143*  BILITOT 0.9  PROT 7.1  7.3  ALBUMIN 3.0*   No results for input(s): "LIPASE", "AMYLASE" in the last 168 hours. No results for input(s): "AMMONIA" in the last 168 hours.  CBC: Recent Labs  Lab 04/29/23 1655 05/01/23 0326 05/02/23 1054  WBC 5.9 7.6 7.0  HGB 11.5* 11.0* 10.6*  HCT 35.9* 35.3* 33.2*  MCV 90.2 91.0 89.2  PLT 182 188 228     HbA1C: Hemoglobin A1C  Date/Time Value Ref Range Status   02/20/2023 03:01 PM 6.5 (A) 4.0 - 5.6 % Final   HbA1c, POC (controlled diabetic range)  Date/Time Value Ref Range Status  06/03/2018 09:03 AM 7.0 0.0 - 7.0 % Final  12/16/2017 01:50 PM 5.4 0.0 - 7.0 % Final   Hgb A1c MFr Bld  Date/Time Value Ref Range Status  11/22/2022 08:44 AM 7.0 (H) 4.8 - 5.6 % Final    Comment:    (NOTE) Pre diabetes:          5.7%-6.4%  Diabetes:              >6.4%  Glycemic control for   <7.0% adults with diabetes   09/27/2022 04:50 PM 5.9 (H) 4.8 - 5.6 % Final    Comment:    (NOTE)         Prediabetes: 5.7 - 6.4         Diabetes: >6.4         Glycemic control for adults with diabetes: <7.0     Urinalysis: Recent Labs    05/02/23 0240  COLORURINE AMBER*  LABSPEC 1.018  PHURINE 5.0  GLUCOSEU 50*  HGBUR NEGATIVE  BILIRUBINUR NEGATIVE  KETONESUR NEGATIVE  PROTEINUR 100*  NITRITE NEGATIVE  LEUKOCYTESUR NEGATIVE      Imaging:   CT abdomen and pelvis  dated 04/10/2023, CTA chest dated 04/30/2023, nuclear medicine PET dated 12/31/2022   FINDINGS: Lower chest: Partially imaged central venous catheter terminates at the superior cavoatrial junction. Re-expansion of partially imaged lingula and left lower lobe status post thoracentesis. Diffuse patchy ground-glass opacities of the lingula and partially imaged left lower lobe. Similar large right and decreased moderate left pleural effusions. No pneumothorax. Similar pericardial effusion. Coronary artery calcifications.   Hepatobiliary: No focal hepatic lesions. No intra or extrahepatic biliary ductal dilation. Contracted gallbladder demonstrates cholelithiasis. Adrenals   Pancreas: No focal lesions or main ductal dilation.   Spleen: Normal in size without focal abnormality.   Adrenals/Urinary Tract: Unchanged 1.3 cm left adrenal nodule measures 13 HU, not previously FDG-avid, likely adenoma. No specific follow-up imaging recommended. No right adrenal nodule. Mild left hydronephrosis  contains excreted renal contrast material from recent CTA chest. Contrast is seen to the level of the proximal ureter, which demonstrates mild circumferential mural thickening with adjacent soft tissue irregularity (2:52), inseparable from the anterior psoas. Excreted contrast material within the urinary bladder.   Stomach/Bowel: Decreased conspicuity of mural thickening involving the gastric cardia. No evidence of bowel wall thickening, distention, or inflammatory changes. Normal appendix, which is contained within the hernia.   Vascular/Lymphatic: Aortic atherosclerosis. Partially imaged paraesophageal lymph nodes measuring up to 9 mm (2:2) are unchanged. Continued decrease in size of perigastric node measuring 2.2 x 2.0 cm (2:34), previously 3.7 x 2.1 cm. Interval increased multi station periportal, retroaortic, and pelvic lymphadenopathy, for example   -14 mm portacaval (2:33), previously 13 mm   -10 mm left para-aortic (2:39), previously 5 mm   -14 mm right external iliac (2:74), previously 12 mm   Reproductive: Prostate is unremarkable.   Other: Trace ascites.  No free air or fluid collection.   Musculoskeletal: No acute or abnormal lytic or blastic osseous lesions. Multilevel degenerative changes of the partially imaged thoracic and lumbar spine. Large ventral midline lower abdominal wall hernia containing nonobstructed loops of small and large bowel, as before.   IMPRESSION: 1. Mild left hydronephrosis contains excreted renal contrast material from recent CTA chest, suggestive of delayed nephrogram. Contrast is seen to the level of the proximal ureter, which demonstrates mild circumferential mural thickening with adjacent soft tissue irregularity, inseparable from the anterior psoas. Findings are suspicious for ureteral involvement by metastatic disease, less likely ascending urinary tract infection. Recommend correlation with urinalysis. 2. Interval increased multi  station periportal, retroaortic, and pelvic lymphadenopathy, indeterminate, however suspicious for metastatic disease. 3. Decreased conspicuity of mural thickening involving the gastric cardia. Decreased size of perigastric nodule. 4. Re-expansion of partially imaged lingula and left lower lobe status post thoracentesis. Diffuse patchy ground-glass opacities of the lingula and partially imaged left lower lobe, which may represent re-expansion pulmonary edema. 5. Similar large right and decreased moderate left pleural effusions. 6. Similar pericardial effusion. 7. Aortic Atherosclerosis (ICD10-I70.0). Coronary artery calcifications. Assessment for potential risk factor modification, dietary therapy or pharmacologic therapy may be warranted, if clinically indicated.     Electronically Signed   By: Agustin Cree M.D.   On: 05/01/2023 15:51  Medications:    albumin human 12.5 g (05/04/23 0935)    apixaban  5 mg Oral BID   atorvastatin  40 mg Oral q1800   busPIRone  7.5 mg Oral BID   Chlorhexidine Gluconate Cloth  6 each Topical Daily   metoprolol tartrate  12.5 mg Oral BID   sertraline  75 mg Oral Daily   sodium chloride flush  10-40 mL Intracatheter Q12H    Assessment/ Plan:     53 y.o. male with a PMHx of stage IV adenocarcinoma of stomach, cirrhosis, edema, hypertension, congestive heart failure, diabetes, history of stroke, and chronic kidney disease now admitted with history of worsening edema and congestive heart failure.  On admission he had CT angiogram of the chest with IV contrast.  Since then the renal function started to get worse.  The CT scan showed left-sided hydronephrosis.  He has been on losartan and torsemide as outpatient.     Principal Problem:   Pleural effusion Active Problems:   Moderate obstructive sleep apnea   History of CVA (cerebrovascular accident)   Other cirrhosis of liver (HCC)   HFrEF (heart failure with reduced ejection fraction) (HCC)    Paroxysmal atrial fibrillation (HCC)   Gastric cancer (HCC)   Acute on chronic systolic heart failure (HCC)   Chronic kidney disease, stage 3b (HCC)   Chemotherapy-induced neuropathy (HCC)   PAF (paroxysmal atrial fibrillation) (HCC)   Diabetes mellitus (HCC)   Class 1 obesity with serious comorbidity in adult   Benign essential HTN   Status post thoracentesis   Palliative care encounter   Elevated troponin   Chest pain   #1: Acute kidney injury on chronic kidney disease: The chronic kidney disease can be secondary to hypertension complicated by diabetic kidney disease.  Acute kidney injury is most likely secondary to contrast nephropathy with the possibility of left hydronephrosis.  Patient may have metastatic disease of the ureters.  Urology evaluation requested. Will continue to monitor closely.  Will continue to hold the diuretics and losartan.  Spoke to the patient and family regarding the possibility of initiating hemodialysis treatments if the renal indicis does not improve.   #2: Pericardial effusion/pleural effusion: Will continue to monitor closely.  Will use low-dose diuretics as needed.   #3: Gastric carcinoma: Patient has stage IV adenocarcinoma.  Being followed by oncology.  Prognosis seems to be poor.   #4:  Congestive heart failure/cirrhosis/edema: Will continue to monitor closely.  Diuretics on as needed basis.   Spoke to the entire family at bedside in detail. Answered all their questions to their satisfaction. Overall prognosis is very poor.  Patient will be a very poor candidate for ongoing outpatient dialysis treatments. Labs and medications reviewed. Will continue to follow along with you.   LOS: 4 Lorain Childes, MD [facility] 2/2/202512:11 PM

## 2023-05-04 NOTE — Progress Notes (Signed)
Brief cardiology note: Patient was off the floor when I came by x2 to see him. Chart reviewed extensively. Cardiology will follow up tomorrow.  Plan based on this:   Acute on chronic respiratory failure Pleural effusion sp thoracentesis -diuretics on hold given rising Cr, nephrology following -BNP normal -Albumin mildly reduced at 3 but not severely low, cannot full explain due to third spacing -could consider RHC if renal function continues to worsen in order to determine fluid status. Question would be if this would change overall management.   Moderate pericardial effusion: repeat limited echo 2/3. No tamponade on prior   Stage 4 gastric cancer: reviewed most recent oncology note from Dr. Cathie Hoops yesterday. CT A/P suspicious for disease progression. Notes long term prognosis is poor; not curable, but may have palliative treatment options if he improves from current hospitalization   Acute kidney injury  on chronic kidney disease: nephrology managing, likely 2/2 contrast administered on 04/30/23. Diuretics on hold. There is some left hydronephrosis, considering nephrostomy tube.   Nonischemic cardiomyopathy: given rising Cr, holding SLGT2i, ARB, diuretic, and MRA -on fractionated metoprolol now, consolidate prior to discharge -currently on midodrine 10 mg TID. BP has been rising. Recommend cutting back to 5 mg dose pending nephrology recommendations   Permanent atrial fibrillation -CHA2DS2/VAS Stroke Risk Points=6  -continue apixaban; can be held if procedure such as nephrostomy tube planned -rate control with metoprolol as above   Elevated troponin: likely demand in the setting of his acute respiratory failure and worsening renal function    Signed, Jodelle Red, MD  05/04/2023, 4:22 PM

## 2023-05-04 NOTE — Consult Note (Signed)
Consultation: Left hydronephrosis, acute kidney injury Requested by: Dr. Enedina Finner   History of Present Illness: Jeremiah Vance is a 53 year old male with metastatic gastric carcinoma.  He is status post first-line treatment with FOLFOX x 12 cycles currently on 5-FU maintenance.  He was admitted with shortness of breath.  He has persistent bilateral pleural effusion and Eliquis use with prior history of CVA.  Diuresis aided to worsening renal function in the face of chronic kidney disease with a baseline creatinine around 2, GFR 40.  He underwent a 04/30/2023 CT angio of the chest.  Follow-up 05/01/2023 CT abdomen and pelvis without contrast revealed Mild left hydronephrosis contains excreted renal contrast material from recent CTA chest, suggestive of delayed nephrogram. Contrast is seen to the level of the proximal ureter, which demonstrates mild circumferential mural thickening with adjacent a small soft tissue irregularity, inseparable from the anterior psoas along with indeterminate interval increased multi station periportal, retroaortic, and pelvic lymphadenopathy suspicious for disease progression. Findings are suspicious for ureteral involvement by metastatic disease.   Dr. Cathie Hoops felt that long-term prognosis is poor as this is not a curable condition, but he may try other chemotherapy options if his current condition improves.  Unfortunately, Jeremiah Vance's creatinine has been rising from Cr rising 1.9 --> 5.09 and he is developed some hyperkalemia. Cardiology would consider RHC if renal function continues to worsen.   He has no voiding complaints.  No dysuria or gross hematuria.  He and his mom report that he has poor p.o. intake.  Past Medical History:  Diagnosis Date   Acute ischemic stroke (HCC) 09/06/2017   Acute renal failure superimposed on stage 3a chronic kidney disease (HCC) 07/08/2019   Acute right-sided weakness    Allergies    Anasarca    Anemia 07/10/2017   Arthritis    Cancer (HCC)    stomach  and esophageal cancer stage 4   Chicken pox    CKD (chronic kidney disease) stage 3, GFR 30-59 ml/min (HCC) 06/2017   Depression    Elevated troponin 07/08/2019   Heart failure with mid-range ejection fraction (HCC)    a. 06/2017 Echo: EF 40-45%; b. 07/2019 Echo: EF 50-55%; c. 10/2021 Echo: EF 30-35%; d. 10/2022 Echo: EF 55-60%; e. 04/2023 Echo: EF 40-45%, glob HK, mod LVH, mildly reduced RV fxn, mod dil LA, mild MR, Ao root 41mm, asc Ao 42mm.   High cholesterol    History of cardiomyopathy    LVEF 40 to 45% in April 2019 - subsequently normalized   Hyperbilirubinemia 07/10/2017   Hypertension    Ischemic stroke (HCC)    Small left internal capsule infarct due to lacunar disease   Morbid obesity (HCC)    NICM (nonischemic cardiomyopathy) (HCC)    a. 06/2017 Echo: EF 40-45%; b. 07/2019 Echo: EF 50-55%; c. 10/2021 Echo: EF 30-35%; d. 12/2021 MV: EF 60%, no ischemia/scar; e. 10/2022 Echo: EF 55-60%; f. 04/2023 Echo: EF 40-45%.   Normocytic anemia 12/16/2017   Permanent atrial fibrillation (HCC)    a. CHA2DS2VASc = 5-->eliquis.   Recurrent incisional hernia with incarceration s/p repair 10/22/2017 10/21/2017   Stroke (HCC) 08/2017   "right sided weakness since; getting stronger though" (10/22/2017)   Type 2 diabetes mellitus [facility])    Past Surgical History:  Procedure Laterality Date   ABDOMINAL HERNIA REPAIR  2008; 10/22/2017   "scope; OPEN REPAIR INCARCERATED VENTRAL HERNIA   ESOPHAGOGASTRODUODENOSCOPY (EGD) WITH PROPOFOL N/A 09/10/2022   Procedure: ESOPHAGOGASTRODUODENOSCOPY (EGD) WITH PROPOFOL;  Surgeon: Toney Reil, MD;  Location: [facility] ENDOSCOPY;  Service: Gastroenterology;  Laterality: N/A;   ESOPHAGOGASTRODUODENOSCOPY (EGD) WITH PROPOFOL N/A 09/28/2022   Procedure: ESOPHAGOGASTRODUODENOSCOPY (EGD) WITH PROPOFOL;  Surgeon: Jaynie Collins, DO;  Location: [facility] ENDOSCOPY;  Service: Gastroenterology;  Laterality: N/A;   HEMOSTASIS CONTROL  09/28/2022   Procedure: HEMOSTASIS CONTROL;   Surgeon: Jaynie Collins, DO;  Location: [facility] ENDOSCOPY;  Service: Gastroenterology;;   HERNIA REPAIR     KNEE ARTHROSCOPY Right 1989   PORTA CATH INSERTION N/A 09/25/2022   Procedure: PORTA CATH INSERTION;  Surgeon: Annice Needy, MD;  Location: [facility] INVASIVE CV LAB;  Service: Cardiovascular;  Laterality: N/A;   VENTRAL HERNIA REPAIR N/A 10/22/2017   Procedure: OPEN REPAIR INCARCERATED VENTRAL HERNIA;  Surgeon: Glenna Fellows, MD;  Location: [facility] OR;  Service: General;  Laterality: N/A;    Home Medications:  Medications Prior to Admission  Medication Sig Dispense Refill Last Dose/Taking   acetaminophen (TYLENOL) 325 MG tablet Take 2 tablets (650 mg total) by mouth every 4 (four) hours as needed for mild pain (or temp > 37.5 C (99.5 F)).   Taking As Needed   apixaban (ELIQUIS) 5 MG TABS tablet Take 1 tablet (5 mg total) by mouth 2 (two) times daily. 60 tablet 1 04/29/2023 Morning   busPIRone (BUSPAR) 7.5 MG tablet Take 1 tablet (7.5 mg total) by mouth 2 (two) times daily. 180 tablet 1 04/29/2023   dapagliflozin propanediol (FARXIGA) 10 MG TABS tablet Take 1 tablet (10 mg total) by mouth daily. 30 tablet 0 04/29/2023 Morning   diclofenac Sodium (VOLTAREN) 1 % GEL Apply 4 g topically 4 (four) times daily.   Taking   losartan (COZAAR) 25 MG tablet Take 1 tablet (25 mg total) by mouth daily. 30 tablet 1 04/29/2023 Morning   metoprolol tartrate (LOPRESSOR) 25 MG tablet Take 0.5 tablets (12.5 mg total) by mouth 2 (two) times daily. 60 tablet 1 04/29/2023 Morning   ondansetron (ZOFRAN-ODT) 8 MG disintegrating tablet Take 1 tablet (8 mg total) by mouth every 8 (eight) hours as needed for nausea or vomiting. 60 tablet 1 Taking As Needed   pantoprazole (PROTONIX) 40 MG tablet Take 1 tablet (40 mg total) by mouth 2 (two) times daily before a meal. 60 tablet 3 04/29/2023 Morning   prochlorperazine (COMPAZINE) 10 MG tablet Take 1 tablet (10 mg total) by mouth every 6 (six) hours as needed for nausea or  vomiting. 60 tablet 0 Taking As Needed   sertraline (ZOLOFT) 25 MG tablet Take 1 tablet (25 mg total) by mouth daily. (Patient taking differently: Take 25 mg by mouth daily. Take along with one 50 mg tablet for total 75 mg once daily) 30 tablet 3 04/29/2023 Morning   sertraline (ZOLOFT) 50 MG tablet Take 1 tablet (50 mg total) by mouth daily. 30 tablet 3 04/29/2023 Morning   torsemide (DEMADEX) 10 MG tablet Take 1 tablet (10 mg total) by mouth daily as needed (lower ext swelling). 30 tablet 0 Taking As Needed   atorvastatin (LIPITOR) 40 MG tablet Take 1 tablet (40 mg total) by mouth daily at 6 PM. 90 tablet 3 04/28/2023   Continuous Glucose Sensor (FREESTYLE LIBRE 2 SENSOR) MISC Place 1 sensor on the skin every 14 days. Use to check glucose continuously 2 each 11    Continuous Glucose Sensor (FREESTYLE LIBRE 3 SENSOR) MISC Place 1 sensor on the skin every 14 days. Use to check glucose continuously 2 each 11    dexamethasone (DECADRON) 4 MG tablet TAKE 2 TABLETS(8 MG) BY  MOUTH DAILY. START THE DAY AFTER CHEMOTHERAPY FOR 2 DAYS. TAKE WITH FOOD 30 tablet 1    feeding supplement (ENSURE ENLIVE / ENSURE PLUS) LIQD Take 237 mLs by mouth 2 (two) times daily between meals.      oxyCODONE (OXY IR/ROXICODONE) 5 MG immediate release tablet Take 1 tablet (5 mg total) by mouth every 6 (six) hours as needed for severe pain (pain score 7-10). (Patient not taking: Reported on 04/30/2023) 60 tablet 0 Not Taking   Allergies:  Allergies  Allergen Reactions   Pollen Extract Other (See Comments)   Sulfa Antibiotics     Family History  Problem Relation Age of Onset   Diabetes Mother    Stroke Mother    Arthritis Mother    Depression Mother    Heart disease Mother    Hypertension Mother    Learning disabilities Mother    Mental illness Mother    Sleep apnea Mother    Diabetes Father    Heart disease Father    Arthritis Father    Hearing loss Father    Hyperlipidemia Father    Heart attack Father     Hypertension Father    Stroke Father    Diabetes Sister    Depression Sister    Diabetes Sister    Hypertension Sister    Mental illness Sister    Sleep apnea Sister    Diabetes Maternal Grandmother    Heart disease Maternal Grandmother    Depression Maternal Grandmother    Hyperlipidemia Maternal Grandmother    Hypertension Maternal Grandmother    Stroke Maternal Grandmother    Hypertension Maternal Grandfather    Hyperlipidemia Maternal Grandfather    Stroke Maternal Grandfather    Arthritis Paternal Grandmother    Hearing loss Paternal Grandmother    Stroke Paternal Grandfather    Heart disease Paternal Grandfather    Arthritis Paternal Grandfather    Heart attack Paternal Grandfather    Melanoma Other    Social History:  reports that he has never smoked. He has never used smokeless tobacco. He reports current alcohol use. He reports that he does not use drugs.  ROS: A complete review of systems was performed.  All systems are negative except for pertinent findings as noted. Review of Systems  All other systems reviewed and are negative.    Physical Exam:  Vital signs in last 24 hours: Temp:  [97.6 F (36.4 C)-98 F (36.7 C)] 97.8 F (36.6 C) (02/02 1201) Pulse Rate:  [41-117] 117 (02/02 1201) Resp:  [16-18] 16 (02/02 0839) BP: (115-153)/(92-116) 128/92 (02/02 1201) SpO2:  [90 %-99 %] 90 % (02/02 1201) General:  Alert and oriented, No acute distress HEENT: Normocephalic, atraumatic Cardiovascular: Regular rate and rhythm Lungs: Regular rate and effort Abdomen: Soft, nontender, nondistended, no abdominal masses Back: No CVA tenderness Extremities: No edema Neurologic: Grossly intact  Laboratory Data:  Results for orders placed or performed during the hospital encounter of 04/30/23 (from the past 24 hours)  Basic metabolic panel     Status: Abnormal   Collection Time: 05/04/23  3:37 AM  Result Value Ref Range   Sodium 133 (L) 135 - 145 mmol/L   Potassium  5.4 (H) 3.5 - 5.1 mmol/L   Chloride 100 98 - 111 mmol/L   CO2 18 (L) 22 - 32 mmol/L   Glucose, Bld 135 (H) 70 - 99 mg/dL   BUN 96 (H) 6 - 20 mg/dL   Creatinine, Ser 1.61 (H) 0.61 - 1.24 mg/dL  Calcium 9.0 8.9 - 10.3 mg/dL   GFR, Estimated 13 (L) >60 mL/min   Anion gap 15 5 - 15  Glucose, capillary     Status: Abnormal   Collection Time: 05/04/23  5:08 AM  Result Value Ref Range   Glucose-Capillary 125 (H) 70 - 99 mg/dL  Glucose, capillary     Status: Abnormal   Collection Time: 05/04/23  8:40 AM  Result Value Ref Range   Glucose-Capillary 126 (H) 70 - 99 mg/dL   Recent Results (from the past 240 hours)  Resp panel by RT-PCR (RSV, Flu A&B, Covid) Anterior Nasal Swab     Status: None   Collection Time: 04/30/23  6:22 AM   Specimen: Anterior Nasal Swab  Result Value Ref Range Status   SARS Coronavirus 2 by RT PCR NEGATIVE NEGATIVE Final    Comment: (NOTE) SARS-CoV-2 target nucleic acids are NOT DETECTED.  The SARS-CoV-2 RNA is generally detectable in upper respiratory specimens during the acute phase of infection. The lowest concentration of SARS-CoV-2 viral copies this assay can detect is 138 copies/mL. A negative result does not preclude SARS-Cov-2 infection and should not be used as the sole basis for treatment or other patient management decisions. A negative result may occur with  improper specimen collection/handling, submission of specimen other than nasopharyngeal swab, presence of viral mutation(s) within the areas targeted by this assay, and inadequate number of viral copies(<138 copies/mL). A negative result must be combined with clinical observations, patient history, and epidemiological information. The expected result is Negative.  Fact Sheet for Patients:  BloggerCourse.com  Fact Sheet for Healthcare Providers:  SeriousBroker.it  This test is no t yet approved or cleared by the [place] FDA and  has been  authorized for detection and/or diagnosis of SARS-CoV-2 by FDA under an Emergency Use Authorization (EUA). This EUA will remain  in effect (meaning this test can be used) for the duration of the COVID-19 declaration under Section 564(b)(1) of the Act, 21 U.S.C.section 360bbb-3(b)(1), unless the authorization is terminated  or revoked sooner.       Influenza A by PCR NEGATIVE NEGATIVE Final   Influenza B by PCR NEGATIVE NEGATIVE Final    Comment: (NOTE) The Xpert Xpress SARS-CoV-2/FLU/RSV plus assay is intended as an aid in the diagnosis of influenza from Nasopharyngeal swab specimens and should not be used as a sole basis for treatment. Nasal washings and aspirates are unacceptable for Xpert Xpress SARS-CoV-2/FLU/RSV testing.  Fact Sheet for Patients: BloggerCourse.com  Fact Sheet for Healthcare Providers: SeriousBroker.it  This test is not yet approved or cleared by the [place] FDA and has been authorized for detection and/or diagnosis of SARS-CoV-2 by FDA under an Emergency Use Authorization (EUA). This EUA will remain in effect (meaning this test can be used) for the duration of the COVID-19 declaration under Section 564(b)(1) of the Act, 21 U.S.C. section 360bbb-3(b)(1), unless the authorization is terminated or revoked.     Resp Syncytial Virus by PCR NEGATIVE NEGATIVE Final    Comment: (NOTE) Fact Sheet for Patients: BloggerCourse.com  Fact Sheet for Healthcare Providers: SeriousBroker.it  This test is not yet approved or cleared by the [place] FDA and has been authorized for detection and/or diagnosis of SARS-CoV-2 by FDA under an Emergency Use Authorization (EUA). This EUA will remain in effect (meaning this test can be used) for the duration of the COVID-19 declaration under Section 564(b)(1) of the Act, 21 U.S.C. section 360bbb-3(b)(1), unless the  authorization is terminated or revoked.  Performed at [facility], [address] [place], [place] [address]   Body fluid culture w Gram Stain     Status: None   Collection Time: 04/30/23 10:52 AM   Specimen: PATH Cytology Pleural fluid  Result Value Ref Range Status   Specimen Description   Final    PLEURAL Performed at [facility], [address] [place], [place] [address]    Special Requests   Final    PLEURAL Performed at [facility], [address] [place], [place] [address]    Gram Stain   Final    FEW WBC PRESENT, PREDOMINANTLY MONONUCLEAR NO ORGANISMS SEEN    Culture   Final    NO GROWTH 3 DAYS Performed at [facility], [address] [place], [place] [address]    Report Status 05/03/2023 FINAL  Final   Creatinine: Recent Labs    04/29/23 1655 05/01/23 0326 05/02/23 1054 05/03/23 0540 05/04/23 0337  CREATININE 2.36* 2.84* 3.52* 4.09* 5.09*    Impression/Assessment:  Worsening renal failure with mild to moderate left hydronephrosis in the face of metastatic gastric carcinoma, pleural effusions, diuresis, poor p.o. intake, possible need for RHC and chronic kidney disease-  Plan:  I discussed with Jeremiah Vance and his mom he is in a very difficult position.  He has multiple reasons for a rising creatinine.  That being said he did have mild to moderate left hydronephrosis. We went over the nature risk benefits and alternatives to cystoscopy and stent placement versus a left nephrostomy tube placement.  The retroperitoneal mass and ureteral involvement is minor and a stent might be successful, he is not a good candidate for general anesthesia especially in the face of the need for recurrent stent changes.  That being said he is on Eliquis and not a current candidate for a nephrostomy tube.   I will reassess the hydronephrosis with a KUB (contrast washout versus retention) and a renal ultrasound.  Will make n.p.o. after midnight.  Will  have GU team assess in the a.m.  Cardiology also mention possible need for heart cath Monday, so will need to risk stratify with them as well.  Jerilee Field 05/04/2023, 3:44 PM

## 2023-05-04 NOTE — Progress Notes (Signed)
Pt has been up to the bathroom to void x2 so far this shift. Each time he had no urinary output. Bladder scanned approximately 0040 and it showed 0 ml. Also, when pt was up his heart rated elevated to the 120's-140's and sustained until he was back in bed. He is running A-fib and A-Flutter, with inverted T=waves, and occasional PAC's and PVC's.  On call provider notified.

## 2023-05-04 NOTE — Progress Notes (Addendum)
Notified on call provider the following:  Just attempted to get this pt up OOB to void and he refused. He has refused to go in the urinal or use a purewick since he was admitted. 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 We cannot get an accurate bladder scan on him I think due to his anatomy. The last I see he voided was on day shift yesterday 100 cc.   Provider responded the following:  please discuss with day team  BM seems like ongoing and management issue

## 2023-05-04 NOTE — Progress Notes (Signed)
[facility]  - [place] at [facility]   PATIENT NAME: Jeremiah Vance    MR#:  [id number]  DATE OF BIRTH:  23-Oct-1970  SUBJECTIVE:  patient seen earlier. No family at bedside during my evaluation. Patient was being set up for breakfast. Has intermittent poor PO intake however does drink his chocolate protein shakes which family has brought at bedside. Denies any pain.   VITALS:  Blood pressure (!) 128/92, pulse (!) 117, temperature 97.8 F (36.6 C), temperature source Oral, resp. rate 16, height 5\' 11"  (1.803 m), weight 106 kg, SpO2 90%.  PHYSICAL EXAMINATION:   GENERAL:  53 y.o.-year-old patient with no acute distress. Deconditioned chronically ill Blind left eye LUNGS: decreased breath sounds bilaterally CARDIOVASCULAR: S1, S2 normal. No murmur   ABDOMEN: Soft, nontender, nondistended. Bowel sounds present.  EXTREMITIES: ++ No  edema b/l.    NEUROLOGIC: nonfocal  patient is alert and awake SKIN: per RN  LABORATORY PANEL:  CBC Recent Labs  Lab 05/02/23 1054  WBC 7.0  HGB 10.6*  HCT 33.2*  PLT 228    Chemistries  Recent Labs  Lab 05/01/23 0326 05/02/23 1054 05/04/23 0337  NA 135   < > 133*  K 4.8   < > 5.4*  CL 103   < > 100  CO2 20*   < > 18*  GLUCOSE 114*   < > 135*  BUN 62*   < > 96*  CREATININE 2.84*   < > 5.09*  CALCIUM 8.6*   < > 9.0  AST 30  --   --   ALT 22  --   --   ALKPHOS 143*  --   --   BILITOT 0.9  --   --    < > = values in this interval not displayed.   Assessment and Plan  Jru D Dieguez is a 53 y.o. male with medical history significant for obesity, recurrent cva, HFrecoveredEF, htn, ckd 3b, Type-2 dm, stage 4 gastric adenocarcinoma, osa, cirrhosis, a fib, who presents with the above.   Discharged 2 days ago, was admitted for chf exacerbation, was diuresed.   Reports chest pain, non-exertional, yesterday afternoon that led him to contact EMS. Chest pain resolved. Also reports dyspnea. No fever, has mild non-productive  cough. Reports compliance with home meds last couple of days, is making urine. Stable cancer-related pain, no vomiting or diarrhea.   Pleural effusion left-sided chronic but worsening -- status post thoracentesis with 1.5 L fluid removed no malignancy seen on cytology -- holding diuresis secondary to elevated creatinine -- followed by pulmonary. Consider outpatient pleural X catheter refill current  AKI CKD 3b suspected due to meds and IV contrast/Malignancy affecting ureter left (as seen on Ct Abdomen) hyperkalemia --CT does show possible metastatic left ureteral involvement but advises correlcation with urinalysis. He did get IV contrast on 1/29. --hold diuresis -- hold losartan --continue albumin -- Creatinine trend 2.8-- 3.52-- 4.09--5.02 -- appreciate nephrology input--Dr korrapati has d/w family for possible Dialysis although he would be a poor candidate -- urology consultation with Dr Mena Goes --give lokelma today for k 5.4   Pericardial effusion -- moderate, previously mild. -- Echo does not show signs of temper not -- [facility] cardiology advises no additional workup at the time being  Acute hypoxic respiratory failure secondary to pleural effusion and history of CHF -- CTA no PE -- shortness of breath improved with thoracentesis -- continue your oxygen likely will need at discharge  Chronic congestive heart failure  systolic history of atrial fibrillation -- Echo recent showed EF of 40 to 45%, moderate LVH -- holding torsemide,Losartan, farxiga due to elevated creatinine -- on beta-blocker -- appreciate [facility] cardiology input -- patient on eliquis  Stage 4 gastric adenocarcinoma Prognosis appears poor given underlying worsening comorbidities, poor functional status. Dr. Cathie Hoops and Mr. Windy Fast have seen. Chemo will need to be paused until other medical conditions stable. Family not ready for hospice yet.     Hx CVA Multiple, with aphasia. Patient/sister report no new  symptoms - cont home apixaban, statin   GAD - home buspar, sertraline    Chronic pain - home oxycodone    HTN Bps low here - pulm has started midodrine   Cirrhosis Appears relatively compensated. Trace ascites seen on CT earlier this month. No report of bleeding  T2DM Well controlled, only on farxiga-- on hold - daily fasting sugars for now   OSA Not on cpap    Debility PT consult pending, family is hesitant to take patient home  Patient overall has poor prognosis.   Procedures: Family communication : sister [name] 2/1 Consults : oncology, nephrology, cardiology CODE STATUS: DNR DVT Prophylaxis : eliquis Level of care: Med-Surg Status is: Inpatient Remains inpatient appropriate because: pleural effusion, AK I    TOTAL TIME TAKING CARE OF THIS PATIENT: 45 minutes.  >50% time spent on counselling and coordination of care  Note: This dictation was prepared with Dragon dictation along with smaller phrase technology. Any transcriptional errors that result from this process are unintentional.  Enedina Finner M.D    [facility]   CC: Primary care physician; Debera Lat, PA-C

## 2023-05-05 ENCOUNTER — Inpatient Hospital Stay (HOSPITAL_COMMUNITY)
Admit: 2023-05-05 | Discharge: 2023-05-05 | Disposition: A | Discharge status: Home / Self Care | Payer: Medicaid Other | Attending: Medical | Admitting: Medical

## 2023-05-05 ENCOUNTER — Other Ambulatory Visit: Payer: Self-pay | Admitting: Oncology

## 2023-05-05 DIAGNOSIS — E875 Hyperkalemia: Secondary | ICD-10-CM | POA: Diagnosis not present

## 2023-05-05 DIAGNOSIS — Z515 Encounter for palliative care: Secondary | ICD-10-CM | POA: Diagnosis not present

## 2023-05-05 DIAGNOSIS — N1330 Unspecified hydronephrosis: Secondary | ICD-10-CM | POA: Diagnosis not present

## 2023-05-05 DIAGNOSIS — N179 Acute kidney failure, unspecified: Secondary | ICD-10-CM | POA: Diagnosis not present

## 2023-05-05 DIAGNOSIS — I3139 Other pericardial effusion (noninflammatory): Secondary | ICD-10-CM

## 2023-05-05 DIAGNOSIS — K7469 Other cirrhosis of liver: Secondary | ICD-10-CM | POA: Diagnosis not present

## 2023-05-05 DIAGNOSIS — I4821 Permanent atrial fibrillation: Secondary | ICD-10-CM | POA: Diagnosis not present

## 2023-05-05 DIAGNOSIS — C799 Secondary malignant neoplasm of unspecified site: Secondary | ICD-10-CM

## 2023-05-05 DIAGNOSIS — C168 Malignant neoplasm of overlapping sites of stomach: Secondary | ICD-10-CM | POA: Diagnosis not present

## 2023-05-05 DIAGNOSIS — J90 Pleural effusion, not elsewhere classified: Secondary | ICD-10-CM | POA: Diagnosis not present

## 2023-05-05 DIAGNOSIS — C169 Malignant neoplasm of stomach, unspecified: Secondary | ICD-10-CM | POA: Diagnosis not present

## 2023-05-05 LAB — POTASSIUM
Potassium: 6 mmol/L — ABNORMAL HIGH (ref 3.5–5.1)
Potassium: 6.3 mmol/L (ref 3.5–5.1)

## 2023-05-05 LAB — BASIC METABOLIC PANEL
Anion gap: 17 — ABNORMAL HIGH (ref 5–15)
BUN: 102 mg/dL — ABNORMAL HIGH (ref 6–20)
CO2: 16 mmol/L — ABNORMAL LOW (ref 22–32)
Calcium: 9 mg/dL (ref 8.9–10.3)
Chloride: 102 mmol/L (ref 98–111)
Creatinine, Ser: 6.45 mg/dL — ABNORMAL HIGH (ref 0.61–1.24)
GFR, Estimated: 10 mL/min — ABNORMAL LOW (ref >60)
Glucose, Bld: 184 mg/dL — ABNORMAL HIGH (ref 70–99)
Potassium: 6.1 mmol/L — ABNORMAL HIGH (ref 3.5–5.1)
Sodium: 135 mmol/L (ref 135–145)

## 2023-05-05 LAB — ECHOCARDIOGRAM LIMITED
Area-P 1/2: 5.5 cm2
S' Lateral: 2.7 cm

## 2023-05-05 LAB — GLUCOSE, CAPILLARY
Glucose-Capillary: 158 mg/dL — ABNORMAL HIGH (ref 70–99)
Glucose-Capillary: 141 mg/dL — ABNORMAL HIGH (ref 70–99)
Glucose-Capillary: 179 mg/dL — ABNORMAL HIGH (ref 70–99)

## 2023-05-05 MED ORDER — DEXTROSE 50 % IV SOLN
25.0000 mL | Freq: Once | INTRAVENOUS | Status: AC
  Administered 2023-05-05: 25 mL via INTRAVENOUS
  Filled 2023-05-05: qty 50

## 2023-05-05 MED ORDER — CALCIUM GLUCONATE-NACL 1-0.675 GM/50ML-% IV SOLN
1.0000 g | Freq: Once | INTRAVENOUS | Status: AC
  Administered 2023-05-05: 1000 mg via INTRAVENOUS
  Filled 2023-05-05: qty 50

## 2023-05-05 MED ORDER — INSULIN ASPART 100 UNIT/ML IV SOLN
10.0000 [IU] | Freq: Once | INTRAVENOUS | Status: AC
Start: 2023-05-05 — End: 2023-05-05
  Administered 2023-05-05: 10 [IU] via INTRAVENOUS
  Filled 2023-05-05: qty 0.1

## 2023-05-05 MED ORDER — METOPROLOL TARTRATE 5 MG/5ML IV SOLN
2.5000 mg | Freq: Once | INTRAVENOUS | Status: AC
  Administered 2023-05-05: 2.5 mg via INTRAVENOUS
  Filled 2023-05-05: qty 5

## 2023-05-05 MED ORDER — SODIUM ZIRCONIUM CYCLOSILICATE 10 G PO PACK
10.0000 g | PACK | Freq: Three times a day (TID) | ORAL | Status: DC
  Administered 2023-05-05: 10 g via ORAL
  Filled 2023-05-05 (×4): qty 1

## 2023-05-05 MED ORDER — INSULIN ASPART 100 UNIT/ML IV SOLN
10.0000 [IU] | Freq: Once | INTRAVENOUS | Status: AC
  Administered 2023-05-05: 10 [IU] via INTRAVENOUS
  Filled 2023-05-05: qty 0.1

## 2023-05-05 MED ORDER — CALCIUM GLUCONATE-NACL 2-0.675 GM/100ML-% IV SOLN
2.0000 g | Freq: Once | INTRAVENOUS | Status: AC
  Administered 2023-05-05: 2000 mg via INTRAVENOUS
  Filled 2023-05-05: qty 100

## 2023-05-05 MED ORDER — SODIUM ZIRCONIUM CYCLOSILICATE 10 G PO PACK
10.0000 g | PACK | Freq: Two times a day (BID) | ORAL | Status: DC
Start: 2023-05-05 — End: 2023-05-05
  Administered 2023-05-05: 10 g via ORAL
  Filled 2023-05-05: qty 1

## 2023-05-05 NOTE — Progress Notes (Signed)
[facility]  - [place] at [facility]   PATIENT NAME: Jeremiah Vance    MR#:  [id number]  DATE OF BIRTH:  Jul 16, 1970  SUBJECTIVE:  patient seen earlier. No family at bedside during my evaluation. Spoke earlier with patient's sister [name] on the phone and updated her with labs and asked her to reach out to the urology PA for further discussion regarding possible stent placement.   Blood pressure (!) 129/96, pulse 79, temperature (!) 97.4 F (36.3 C), resp. rate 20, height 5\' 11"  (1.803 m), weight 106 kg, SpO2 97%.  PHYSICAL EXAMINATION:   GENERAL:  53 y.o.-year-old patient with no acute distress. Deconditioned chronically ill Blind left eye LUNGS: decreased breath sounds bilaterally CARDIOVASCULAR: S1, S2 normal. No murmur   ABDOMEN: Soft, nontender, nondistended EXTREMITIES: ++ No  edema b/l.    NEUROLOGIC: nonfocal  patient is alert and awake   LABORATORY PANEL:  CBC Recent Labs  Lab 05/02/23 1054  WBC 7.0  HGB 10.6*  HCT 33.2*  PLT 228    Chemistries  Recent Labs  Lab 05/01/23 0326 05/02/23 1054 05/05/23 0439  NA 135   < > 135  K 4.8   < > 6.1*  CL 103   < > 102  CO2 20*   < > 16*  GLUCOSE 114*   < > 184*  BUN 62*   < > 102*  CREATININE 2.84*   < > 6.45*  CALCIUM 8.6*   < > 9.0  AST 30  --   --   ALT 22  --   --   ALKPHOS 143*  --   --   BILITOT 0.9  --   --    < > = values in this interval not displayed.   Assessment and Plan  Cayson D Sahli is a 53 y.o. male with medical history significant for obesity, recurrent cva, HFrecoveredEF, htn, ckd 3b, Type-2 dm, stage 4 gastric adenocarcinoma, osa, cirrhosis, a fib, who presents with the above.   Discharged 2 days ago, was admitted for chf exacerbation, was diuresed.   Reports chest pain, non-exertional, yesterday afternoon that led him to contact EMS. Chest pain resolved. Also reports dyspnea. No fever, has mild non-productive cough. Reports compliance with home meds last couple of  days, is making urine. Stable cancer-related pain, no vomiting or diarrhea.   --2/3-- patient's potassium is 6.4. He's NPO hands giving IV meds to see if potassium can be brought down. He is not a good candidate for any dialysis. This was discussed with Dr. Cherylann Ratel. Urology on board. Urology trying to reach with sister to discuss pros and cons for stent placement. Overall patient has a very poor prognosis. Per Urology PA--sister would like to d/w additional family members and wants to hold off on stent placement for now. Will resume diet.  Pleural effusion left-sided chronic but worsening -- status post thoracentesis with 1.5 L fluid removed no malignancy seen on cytology -- holding diuresis secondary to elevated creatinine -- followed by pulmonary. 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 He did get IV contrast on 1/29. --hold diuresis -- hold losartan --continue albumin -- Creatinine trend 2.8-- 3.52-- 4.09--5.02 -- appreciate nephrology input--Dr korrapati has d/w family  Dialysis although he would be a poor candidate -- urology consultation with Dr  Eskridge --give lokelma , calcium gluconate, insulin with dextrose --per Dr Cherylann Ratel-- not a good candidate for HD   Pericardial effusion -- moderate, previously mild. -- Echo does not show signs of temper not -- [facility] cardiology advises no additional workup at the time being  Acute hypoxic respiratory failure secondary to pleural effusion and history of CHF -- CTA no PE -- shortness of breath improved with thoracentesis -- continue your oxygen likely will need at discharge  Chronic congestive heart failure systolic history of atrial fibrillation -- Echo recent showed EF of 40 to 45%, moderate  LVH -- holding torsemide,Losartan, farxiga due to elevated creatinine -- on beta-blocker -- appreciate [facility] cardiology input -- patient on eliquis  Stage 4 gastric adenocarcinoma Prognosis appears poor given underlying worsening comorbidities, poor functional status. Dr. Cathie Hoops and Sharia Reeve Borders have seen. Chemo will need to be paused until other medical conditions stable.     Hx CVA Multiple, with aphasia. Patient/sister report no new symptoms - cont home apixaban, statin   GAD - home buspar, sertraline    Chronic pain - home oxycodone    Cirrhosis Appears relatively compensated. Trace ascites seen on CT earlier this month. No report of bleeding  T2DM Well controlled, only on farxiga-- on hold - daily fasting sugars for now   OSA Not on cpap    Debility,failure to thrive   Patient overall has poor prognosis.   Procedures: Family communication : sister [name] 2/3 Consults : oncology, nephrology, cardiology, Urology CODE STATUS: DNR DVT Prophylaxis : eliquis Level of care: Med-Surg Status is: Inpatient Remains inpatient appropriate because: pleural effusion, AK I, severe hyperkalemia, overall declining due to worsening/progressive cancer    TOTAL TIME TAKING CARE OF THIS PATIENT: 45 minutes.  >50% time spent on counselling and coordination of care  Note: This dictation was prepared with Dragon dictation along with smaller phrase technology. Any transcriptional errors that result from this process are unintentional.  Enedina Finner M.D    [facility]   CC: Primary care physician; Debera Lat, PA-C

## 2023-05-05 NOTE — Progress Notes (Signed)
PT Cancellation Note  Patient Details Name: Jeremiah Vance MRN: [id number] DOB: 1970-10-10   Cancelled Treatment:    Reason Eval/Treat Not Completed: Other (comment).  Chart reviewed and attempted to see pt.  Pt getting vitals taken and a visitor arrived.  Pt ultimately declined therapy at this time and will be seen at later date/time as medically appropriate.    Nolon Bussing, PT, DPT Physical Therapist - [facility]  05/05/23, 4:37 PM

## 2023-05-05 NOTE — Progress Notes (Addendum)
K remains elevated. Did not get his lokelma while labs were drawn D/w dr Theador Hawthorne lokelma to tid, repeat cal gluconate,insulin +dextrose and  check S K at 8pm NP on call to follow. RN to call NP wil results

## 2023-05-05 NOTE — Progress Notes (Signed)
       CROSS COVER NOTE  NAME: Jeremiah Vance MRN: [id number] DOB : [birth year]    Date of Service   05/05/2023   HPI/Events of Note   CN paged for a critical potassium of 6.3. Chart review showed that pt has been hyperkalemic and received rx of lokelma and calcium, insulin and dextrose. Pt however is more lethargic at this time and there is concern for aspiration if oral lokelma is reordered.   Interventions   Plan: Spoke with on-call nephrologist and due to pt's advance condition, he is not a candidate for dialysis.  This NP spoke with pt's sister and she stated that she does not want any major interventions or procedures but will like to hold off on making pt comfort care until some other family members arrive from out of town. Efforts will be made to keep pt comfortable overnight      Amberia Bayless Lamin Geradine Girt, MSN, APRN, AGACNP-BC [facility] [place] Pager: [phone]. Check Amion for Availability

## 2023-05-05 NOTE — Progress Notes (Signed)
Hematology/Oncology Progress note [phone] [phone]     Patient Care Team: Debera Lat, PA-C as PCP - General (Physician Assistant) Debbe Odea, MD as PCP - Cardiology (Cardiology) Toney Reil, MD as Consulting Physician (Gastroenterology) Toney Reil, MD as Consulting Physician (Gastroenterology) Rickard Patience, MD as Consulting Physician (Oncology) Benita Gutter, RN as Oncology Nurse Navigator   Name of the patient: Jeremiah Vance  [id number]  1970/09/28  Date of visit: 05/05/23   INTERVAL HISTORY-  No acute overnight event. Patient reports sob. He feels tired. Appetite is poor  No family member at bedside.  Cr increases to 6.45 potassium 6. 1    Allergies  Allergen Reactions   Pollen Extract Other (See Comments)   Sulfa Antibiotics     Patient Active Problem List   Diagnosis Date Noted   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    Priority: High   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    Priority: Medium    Encounter for antineoplastic chemotherapy 10/02/2022    Priority: Medium    Paroxysmal atrial fibrillation (HCC) 09/08/2022    Priority: Medium    Other cirrhosis of liver (HCC)     Priority: Medium    Clavicular fracture 01/07/2023    Priority: Low   Ischemic cerebrovascular accident (CVA) of frontal lobe (HCC) 11/27/2022    Priority: Low   Goals of care, counseling/discussion 09/19/2022    Priority: Low   Anxiety associated with cancer diagnosis (HCC) 09/19/2022    Priority: Low   IDA (iron deficiency anemia) 09/09/2022    Priority: Low   Cough     Priority: Low   CKD (chronic kidney disease) stage 3, GFR 30-59 ml/min (HCC) 08/31/2019    Priority: Low   Pleural effusion 04/30/2023   Status post thoracentesis 04/30/2023   Palliative care encounter 04/30/2023   Elevated troponin 04/30/2023   Chest pain 04/30/2023   Permanent atrial fibrillation (HCC) 04/24/2023   SOB (shortness of breath)  04/23/2023   PAF (paroxysmal atrial fibrillation) (HCC) 11/27/2022   Diabetes mellitus (HCC) 11/27/2022   Class 1 obesity with serious comorbidity in adult 11/27/2022   Benign essential HTN 11/27/2022   Acute CVA (cerebrovascular accident) (HCC) 11/22/2022   Stroke (HCC) 11/21/2022   Depression with anxiety 11/21/2022   Obesity (BMI 30-39.9) 11/21/2022   Hypomagnesemia 11/21/2022   Mood disorder (HCC) 10/29/2022   Type 2 diabetes mellitus with hyperglycemia, without long-term current use of insulin (HCC) 10/29/2022   Acute on chronic systolic heart failure (HCC) 09/27/2022   Chronic kidney disease, stage 3b (HCC) 09/27/2022   Type II diabetes mellitus with renal manifestations (HCC) 09/27/2022   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   Dysarthria 05/20/2022   Dyslipidemia 01/16/2022   Acute on chronic diastolic CHF (congestive heart failure) (HCC) 01/16/2022   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    HFrEF (heart failure with reduced ejection fraction) (HCC)    History of CVA (cerebrovascular accident) 10/17/2019   DM (diabetes mellitus), type 2 with complications (HCC) 07/08/2019   Moderate obstructive sleep apnea 02/11/2018   Chronic fatigue 12/16/2017   Essential hypertension 12/16/2017   History of small bowel obstruction 10/24/2017   AKI (acute kidney injury) (HCC)    Hyperlipidemia    Gout      Past Medical History:  Diagnosis Date   Acute ischemic stroke (HCC) 09/06/2017   Acute renal failure superimposed on stage 3a chronic kidney disease (HCC) 07/08/2019   Acute right-sided weakness  Allergies    Anasarca    Anemia 07/10/2017   Arthritis    Cancer (HCC)    stomach and esophageal cancer stage 4   Chicken pox    CKD (chronic kidney disease) stage 3, GFR 30-59 ml/min (HCC) 06/2017   Depression    Elevated troponin 07/08/2019   Heart failure with mid-range ejection fraction (HCC)    a. 06/2017 Echo: EF 40-45%; b. 07/2019 Echo:  EF 50-55%; c. 10/2021 Echo: EF 30-35%; d. 10/2022 Echo: EF 55-60%; e. 04/2023 Echo: EF 40-45%, glob HK, mod LVH, mildly reduced RV fxn, mod dil LA, mild MR, Ao root 41mm, asc Ao 42mm.   High cholesterol    History of cardiomyopathy    LVEF 40 to 45% in April 2019 - subsequently normalized   Hyperbilirubinemia 07/10/2017   Hypertension    Ischemic stroke (HCC)    Small left internal capsule infarct due to lacunar disease   Morbid obesity (HCC)    NICM (nonischemic cardiomyopathy) (HCC)    a. 06/2017 Echo: EF 40-45%; b. 07/2019 Echo: EF 50-55%; c. 10/2021 Echo: EF 30-35%; d. 12/2021 MV: EF 60%, no ischemia/scar; e. 10/2022 Echo: EF 55-60%; f. 04/2023 Echo: EF 40-45%.   Normocytic anemia 12/16/2017   Permanent atrial fibrillation (HCC)    a. CHA2DS2VASc = 5-->eliquis.   Recurrent incisional hernia with incarceration s/p repair 10/22/2017 10/21/2017   Stroke (HCC) 08/2017   "right sided weakness since; getting stronger though" (10/22/2017)   Type 2 diabetes mellitus [facility])      Past Surgical History:  Procedure Laterality Date   ABDOMINAL HERNIA REPAIR  2008; 10/22/2017   "scope; OPEN REPAIR INCARCERATED VENTRAL HERNIA   ESOPHAGOGASTRODUODENOSCOPY (EGD) WITH PROPOFOL N/A 09/10/2022   Procedure: ESOPHAGOGASTRODUODENOSCOPY (EGD) WITH PROPOFOL;  Surgeon: Toney Reil, MD;  Location: [facility] ENDOSCOPY;  Service: Gastroenterology;  Laterality: N/A;   ESOPHAGOGASTRODUODENOSCOPY (EGD) WITH PROPOFOL N/A 09/28/2022   Procedure: ESOPHAGOGASTRODUODENOSCOPY (EGD) WITH PROPOFOL;  Surgeon: Jaynie Collins, DO;  Location: [facility] ENDOSCOPY;  Service: Gastroenterology;  Laterality: N/A;   HEMOSTASIS CONTROL  09/28/2022   Procedure: HEMOSTASIS CONTROL;  Surgeon: Jaynie Collins, DO;  Location: [facility] ENDOSCOPY;  Service: Gastroenterology;;   HERNIA REPAIR     KNEE ARTHROSCOPY Right 1989   PORTA CATH INSERTION N/A 09/25/2022   Procedure: PORTA CATH INSERTION;  Surgeon: Annice Needy, MD;  Location: [facility]  INVASIVE CV LAB;  Service: Cardiovascular;  Laterality: N/A;   VENTRAL HERNIA REPAIR N/A 10/22/2017   Procedure: OPEN REPAIR INCARCERATED VENTRAL HERNIA;  Surgeon: Glenna Fellows, MD;  Location: [facility] OR;  Service: General;  Laterality: N/A;    Social History   Socioeconomic History   Marital status: Divorced    Spouse name: Not on file   Number of children: 0   Years of education: Not on file   Highest education level: Not on file  Occupational History   Occupation: Disabled  Tobacco Use   Smoking status: Never   Smokeless tobacco: Never  Vaping Use   Vaping status: Never Used  Substance and Sexual Activity   Alcohol use: Yes    Comment: occasional beer   Drug use: Never   Sexual activity: Not Currently  Other Topics Concern   Not on file  Social History Narrative   He lives with his sister , [name] and she is his MPOA after his stokes   Social Drivers of Corporate investment banker Strain: Low Risk  (04/24/2023)   Overall Financial Resource Strain (CARDIA)  Difficulty of Paying Living Expenses: Not hard at all  Food Insecurity: No Food Insecurity (04/30/2023)   Hunger Vital Sign    Worried About Running Out of Food in the Last Year: Never true    Ran Out of Food in the Last Year: Never true  Transportation Needs: No Transportation Needs (04/30/2023)   PRAPARE - Administrator, Civil Service (Medical): No    Lack of Transportation (Non-Medical): No  Physical Activity: Not on file  Stress: Not on file  Social Connections: Moderately Isolated (04/30/2023)   Social Connection and Isolation Panel [NHANES]    Frequency of Communication with Friends and Family: Three times a week    Frequency of Social Gatherings with Friends and Family: Once a week    Attends Religious Services: 1 to 4 times per year    Active Member of [employer] or Organizations: No    Attends Banker Meetings: Never    Marital Status: Divorced  Catering manager Violence: Not At  Risk (04/30/2023)   Humiliation, Afraid, Rape, and Kick questionnaire    Fear of Current or Ex-Partner: No    Emotionally Abused: No    Physically Abused: No    Sexually Abused: No     Family History  Problem Relation Age of Onset   Diabetes Mother    Stroke Mother    Arthritis Mother    Depression Mother    Heart disease Mother    Hypertension Mother    Learning disabilities Mother    Mental illness Mother    Sleep apnea Mother    Diabetes Father    Heart disease Father    Arthritis Father    Hearing loss Father    Hyperlipidemia Father    Heart attack Father    Hypertension Father    Stroke Father    Diabetes Sister    Depression Sister    Diabetes Sister    Hypertension Sister    Mental illness Sister    Sleep apnea Sister    Diabetes Maternal Grandmother    Heart disease Maternal Grandmother    Depression Maternal Grandmother    Hyperlipidemia Maternal Grandmother    Hypertension Maternal Grandmother    Stroke Maternal Grandmother    Hypertension Maternal Grandfather    Hyperlipidemia Maternal Grandfather    Stroke Maternal Grandfather    Arthritis Paternal Grandmother    Hearing loss Paternal Grandmother    Stroke Paternal Grandfather    Heart disease Paternal Grandfather    Arthritis Paternal Grandfather    Heart attack Paternal Grandfather    Melanoma Other      Current Facility-Administered Medications:    albumin human 25 % solution 12.5 g, 12.5 g, Intravenous, Daily, Aleskerov, Fuad, MD, Last Rate: 60 mL/hr at 05/05/23 0928, 12.5 g at 05/05/23 0928   apixaban (ELIQUIS) tablet 5 mg, 5 mg, Oral, BID, Ardis Rowan, Walid A, RPH, 5 mg at 05/04/23 2209   atorvastatin (LIPITOR) tablet 40 mg, 40 mg, Oral, q1800, Wouk, Wilfred Curtis, MD, 40 mg at 05/04/23 1723   busPIRone (BUSPAR) tablet 7.5 mg, 7.5 mg, Oral, BID, Wouk, Wilfred Curtis, MD, 7.5 mg at 05/04/23 2209   Chlorhexidine Gluconate Cloth 2 % PADS 6 each, 6 each, Topical, Daily, Wouk, Wilfred Curtis, MD, 6 each at  05/05/23 0936   guaiFENesin-dextromethorphan (ROBITUSSIN DM) 100-10 MG/5ML syrup 10 mL, 10 mL, Oral, Q4H PRN, Enedina Finner, MD   lip balm (BLISTEX) ointment, , Topical, PRN, Hunt, Madison H, RPH   loratadine (  CLARITIN) tablet 10 mg, 10 mg, Oral, Daily PRN, Enedina Finner, MD   metoprolol tartrate (LOPRESSOR) tablet 12.5 mg, 12.5 mg, Oral, BID, Wouk, Wilfred Curtis, MD, 12.5 mg at 05/04/23 2210   midodrine (PROAMATINE) tablet 10 mg, 10 mg, Oral, TID PRN, Enedina Finner, MD   Muscle Rub CREA 1 Application, 1 Application, Topical, PRN, Enedina Finner, MD   ondansetron [facility]) injection 4 mg, 4 mg, Intravenous, Q6H PRN, Enedina Finner, MD, 4 mg at 05/05/23 0524   ondansetron (ZOFRAN-ODT) disintegrating tablet 4 mg, 4 mg, Oral, Q8H PRN, Wouk, Wilfred Curtis, MD, 4 mg at 05/03/23 2048   oxyCODONE (Oxy IR/ROXICODONE) immediate release tablet 5 mg, 5 mg, Oral, Q6H PRN, Kathrynn Running, MD, 5 mg at 05/03/23 2048   polyvinyl alcohol (LIQUIFILM TEARS) 1.4 % ophthalmic solution 1 drop, 1 drop, Both Eyes, PRN, Enedina Finner, MD   sertraline (ZOLOFT) tablet 75 mg, 75 mg, Oral, Daily, Wouk, Wilfred Curtis, MD, 75 mg at 05/04/23 0930   sodium chloride flush (NS) 0.9 % injection 10-40 mL, 10-40 mL, Intracatheter, Q12H, Wouk, Wilfred Curtis, MD, 10 mL at 05/05/23 0845   sodium zirconium cyclosilicate (LOKELMA) packet 10 g, 10 g, Oral, BID, Enedina Finner, MD   Physical exam:  Vitals:   05/04/23 1722 05/04/23 1956 05/05/23 0334 05/05/23 0850  BP:  (!) 140/93 (!) 129/96 (!) 142/95  Pulse: 93 82 79 77  Resp:  18 20 19   Temp:  97.7 F (36.5 C) (!) 97.4 F (36.3 C) (!) 97.5 F (36.4 C)  TempSrc:    Oral  SpO2: 95% 99% 97%   Weight:      Height:       Physical Exam Constitutional:      General: He is not in acute distress.    Appearance: He is obese. He is not diaphoretic.  HENT:     Head: Normocephalic and atraumatic.  Eyes:     General: No scleral icterus. Cardiovascular:     Rate and Rhythm: Normal rate and regular  rhythm.  Pulmonary:     Effort: Pulmonary effort is normal. No respiratory distress.     Comments: On nasal cannula oxygen.  Decreased breath sound bilaterally, R>L  Abdominal:     General: Bowel sounds are normal. There is no distension.     Palpations: Abdomen is soft.  Musculoskeletal:        General: Normal range of motion.     Cervical back: Normal range of motion and neck supple.  Skin:    General: Skin is dry.     Findings: No erythema.  Neurological:     Mental Status: He is alert and oriented to person, place, and time. Mental status is at baseline.     Motor: No abnormal muscle tone.  Psychiatric:        Mood and Affect: Affect normal.     Comments: Flat effect      Labs    Latest Ref Rng & Units 05/02/2023   10:54 AM 05/01/2023    3:26 AM 04/29/2023    4:55 PM  CBC  WBC 4.0 - 10.5 K/uL 7.0  7.6  5.9   Hemoglobin 13.0 - 17.0 g/dL 16.1  09.6  04.5   Hematocrit 39.0 - 52.0 % 33.2  35.3  35.9   Platelets 150 - 400 K/uL 228  188  182       Latest Ref Rng & Units 05/05/2023    4:39 AM 05/04/2023    3:37 AM 05/03/2023  5:40 AM  CMP  Glucose 70 - 99 mg/dL 283  151  761   BUN 6 - 20 mg/dL 607  96  87   Creatinine 0.61 - 1.24 mg/dL 3.71  0.62  6.94   Sodium 135 - 145 mmol/L 135  133  133   Potassium 3.5 - 5.1 mmol/L 6.1  5.4  4.5   Chloride 98 - 111 mmol/L 102  100  99   CO2 22 - 32 mmol/L 16  18  17    Calcium 8.9 - 10.3 mg/dL 9.0  9.0  8.9      RADIOGRAPHIC STUDIES: I have personally reviewed the radiological images as listed and agreed with the findings in the report. ECHOCARDIOGRAM LIMITED Result Date: 05/05/2023    ECHOCARDIOGRAM LIMITED REPORT   Patient Name:   ARLESTER KEEHAN Date of Exam: 05/05/2023 Medical Rec #:  [id number]       Height:       71.0 in Accession #:    [id number]      Weight:       233.7 lb Date of Birth:  [birth year]       BSA:          2.253 m Patient Age:    52 years        BP:           129/96 mmHg Patient Gender: M               HR:            79 bpm. Exam Location:  [facility] Procedure: Limited Echo, Cardiac Doppler and Color Doppler Indications:     Pericardial Effusion I31.3  History:         Patient has prior history of Echocardiogram examinations, most                  recent 05/01/2023. Stroke; Risk Factors:Diabetes. Non-ischemic                  cardiomyopathy.  Sonographer:     Cristela Blue Referring Phys:  [id number] CADENCE H FURTH Diagnosing Phys: Lorine Bears MD IMPRESSIONS  1. Left ventricular ejection fraction, by estimation, is 50 to 55%. The left ventricle has low normal function. The left ventricle has no regional wall motion abnormalities. There is moderate left ventricular hypertrophy. Left ventricular diastolic parameters are indeterminate.  2. Right ventricular systolic function is normal. The right ventricular size is normal. There is normal pulmonary artery systolic pressure.  3. Left atrial size was mildly dilated.  4. Large pericardial effusion. The pericardial effusion is circumferential. No RV or RA collapse but there is early tamponade my mitral inflow.  5. The mitral valve is normal in structure. No evidence of mitral valve regurgitation. No evidence of mitral stenosis.  6. The aortic valve is normal in structure. Aortic valve regurgitation is not visualized. Aortic valve sclerosis is present, with no evidence of aortic valve stenosis.  7. The inferior vena cava is dilated in size with <50% respiratory variability, suggesting right atrial pressure of 15 mmHg. FINDINGS  Left Ventricle: Left ventricular ejection fraction, by estimation, is 50 to 55%. The left ventricle has low normal function. The left ventricle has no regional wall motion abnormalities. The left ventricular internal cavity size was normal in size. There is moderate left ventricular hypertrophy. Left ventricular diastolic parameters are indeterminate. Right Ventricle: The right ventricular size is normal. No increase in right ventricular wall thickness. Right ventricular  systolic function  is normal. There is normal pulmonary artery systolic pressure. The tricuspid regurgitant velocity is 1.97 m/s, and  with an assumed right atrial pressure of 15 mmHg, the estimated right ventricular systolic pressure is 30.5 mmHg. Left Atrium: Left atrial size was mildly dilated. Right Atrium: Right atrial size was normal in size. Pericardium: A large pericardial effusion is present. The pericardial effusion is circumferential. Mitral Valve: The mitral valve is normal in structure. No evidence of mitral valve stenosis. Tricuspid Valve: The tricuspid valve is normal in structure. Tricuspid valve regurgitation is trivial. No evidence of tricuspid stenosis. Aortic Valve: The aortic valve is normal in structure. Aortic valve regurgitation is not visualized. Aortic valve sclerosis is present, with no evidence of aortic valve stenosis. Pulmonic Valve: The pulmonic valve was normal in structure. Pulmonic valve regurgitation is trivial. No evidence of pulmonic stenosis. Aorta: The aortic root is normal in size and structure. Venous: The inferior vena cava is dilated in size with less than 50% respiratory variability, suggesting right atrial pressure of 15 mmHg. IAS/Shunts: No atrial level shunt detected by color flow Doppler. Additional Comments: Spectral Doppler performed. Color Doppler performed.  LEFT VENTRICLE PLAX 2D LVIDd:         3.90 cm LVIDs:         2.70 cm LV PW:         1.90 cm LV IVS:        1.60 cm  RIGHT VENTRICLE RV Basal diam:  3.60 cm RV Mid diam:    2.90 cm LEFT ATRIUM             Index        RIGHT ATRIUM           Index LA Vol (A2C):   73.9 ml 32.80 ml/m  RA Area:     17.10 cm LA Vol (A4C):   59.1 ml 26.23 ml/m  RA Volume:   45.50 ml  20.20 ml/m LA Biplane Vol: 69.1 ml 30.67 ml/m  MITRAL VALVE               TRICUSPID VALVE MV Area (PHT): 5.50 cm    TR Peak grad:   15.5 mmHg MV Decel Time: 138 msec    TR Vmax:        197.00 cm/s MV E velocity: 36.20 cm/s MV A velocity: 64.50 cm/s  MV E/A ratio:  0.56 Lorine Bears MD Electronically signed by Lorine Bears MD Signature Date/Time: 05/05/2023/11:53:48 AM    Final    DG Chest Port 1 View Result Date: 05/04/2023 CLINICAL DATA:  Pleural effusion, stage IV gastric cancer. EXAM: PORTABLE CHEST 1 VIEW COMPARISON:  04/30/2023 is CT chest 04/30/2023. FINDINGS: Right IJ power port tip is in the low SVC or at the SVC RA junction. Heart is enlarged, stable. Diffuse mixed interstitial and airspace opacification with bilateral pleural effusions. Findings are similar to 04/30/2023. IMPRESSION: Congestive heart failure. Electronically Signed   By: Leanna Battles M.D.   On: 05/04/2023 17:43   DG Abd 1 View Result Date: 05/04/2023 CLINICAL DATA:  Hydronephrosis, stage IV gastric cancer EXAM: ABDOMEN - 1 VIEW COMPARISON:  CT scan 05/01/2023 FINDINGS: A central line terminates in the right atrium. Blunting of both costophrenic angles compatible with bilateral pleural effusions. Thoracolumbar spondylosis. Mild to moderate enlargement of the cardiopericardial silhouette; the 05/01/2023 CT scan showed a pericardial effusion. Formed stool noted in the colon. Dilute retained contrast in the left renal collecting system which appears moderately dilated. IMPRESSION: 1. Dilute retained  contrast in the left renal collecting system which appears moderately dilated. 2. Mild to moderate enlargement of the cardiopericardial silhouette, compatible with pericardial effusion. 3. Bilateral pleural effusions. 4. Formed stool in the colon. Electronically Signed   By: Gaylyn Rong M.D.   On: 05/04/2023 17:43   US RENAL Result Date: 05/04/2023 CLINICAL DATA:  Hydronephrosis. EXAM: RENAL / URINARY TRACT ULTRASOUND COMPLETE COMPARISON:  May 01, 2023. FINDINGS: Right Kidney: Renal measurements: 9.2 x 5.9 x 5.1 cm = volume: 145 mL. Echogenicity within normal limits. No mass or hydronephrosis visualized. Left Kidney: Renal measurements: 10.8 x 5.9 x 4.9 cm = volume: 163  mL. Echogenicity within normal limits. No mass visualized. Moderate hydronephrosis is noted. Two calculi are noted, each measuring approximately 7 mm. Bladder: Not visualized. Other: None. IMPRESSION: Moderate left hydronephrosis. Nonobstructive left nephrolithiasis. Normal right kidney. Electronically Signed   By: Lupita Raider M.D.   On: 05/04/2023 17:40   CT ABDOMEN PELVIS WO CONTRAST Result Date: 05/01/2023 CLINICAL DATA:  History of gastric adenocarcinoma with nausea and vomiting. * Tracking Code: BO * EXAM: CT ABDOMEN AND PELVIS WITHOUT CONTRAST TECHNIQUE: Multidetector CT imaging of the abdomen and pelvis was performed following the standard protocol without IV contrast. RADIATION DOSE REDUCTION: This exam was performed according to the departmental dose-optimization program which includes automated exposure control, adjustment of the mA and/or kV according to patient size and/or use of iterative reconstruction technique. COMPARISON:  CT abdomen and pelvis dated 04/10/2023, CTA chest dated 04/30/2023, nuclear medicine PET dated 12/31/2022 FINDINGS: Lower chest: Partially imaged central venous catheter terminates at the superior cavoatrial junction. Re-expansion of partially imaged lingula and left lower lobe status post thoracentesis. Diffuse patchy ground-glass opacities of the lingula and partially imaged left lower lobe. Similar large right and decreased moderate left pleural effusions. No pneumothorax. Similar pericardial effusion. Coronary artery calcifications. Hepatobiliary: No focal hepatic lesions. No intra or extrahepatic biliary ductal dilation. Contracted gallbladder demonstrates cholelithiasis. Adrenals Pancreas: No focal lesions or main ductal dilation. Spleen: Normal in size without focal abnormality. Adrenals/Urinary Tract: Unchanged 1.3 cm left adrenal nodule measures 13 HU, not previously FDG-avid, likely adenoma. No specific follow-up imaging recommended. No right adrenal nodule. Mild  left hydronephrosis contains excreted renal contrast material from recent CTA chest. Contrast is seen to the level of the proximal ureter, which demonstrates mild circumferential mural thickening with adjacent soft tissue irregularity (2:52), inseparable from the anterior psoas. Excreted contrast material within the urinary bladder. Stomach/Bowel: Decreased conspicuity of mural thickening involving the gastric cardia. No evidence of bowel wall thickening, distention, or inflammatory changes. Normal appendix, which is contained within the hernia. Vascular/Lymphatic: Aortic atherosclerosis. Partially imaged paraesophageal lymph nodes measuring up to 9 mm (2:2) are unchanged. Continued decrease in size of perigastric node measuring 2.2 x 2.0 cm (2:34), previously 3.7 x 2.1 cm. Interval increased multi station periportal, retroaortic, and pelvic lymphadenopathy, for example -14 mm portacaval (2:33), previously 13 mm -10 mm left para-aortic (2:39), previously 5 mm -14 mm right external iliac (2:74), previously 12 mm Reproductive: Prostate is unremarkable. Other: Trace ascites.  No free air or fluid collection. Musculoskeletal: No acute or abnormal lytic or blastic osseous lesions. Multilevel degenerative changes of the partially imaged thoracic and lumbar spine. Large ventral midline lower abdominal wall hernia containing nonobstructed loops of small and large bowel, as before. IMPRESSION: 1. Mild left hydronephrosis contains excreted renal contrast material from recent CTA chest, suggestive of delayed nephrogram. Contrast is seen to the level of the proximal ureter, which  demonstrates mild circumferential mural thickening with adjacent soft tissue irregularity, inseparable from the anterior psoas. Findings are suspicious for ureteral involvement by metastatic disease, less likely ascending urinary tract infection. Recommend correlation with urinalysis. 2. Interval increased multi station periportal, retroaortic, and  pelvic lymphadenopathy, indeterminate, however suspicious for metastatic disease. 3. Decreased conspicuity of mural thickening involving the gastric cardia. Decreased size of perigastric nodule. 4. Re-expansion of partially imaged lingula and left lower lobe status post thoracentesis. Diffuse patchy ground-glass opacities of the lingula and partially imaged left lower lobe, which may represent re-expansion pulmonary edema. 5. Similar large right and decreased moderate left pleural effusions. 6. Similar pericardial effusion. 7. Aortic Atherosclerosis (ICD10-I70.0). Coronary artery calcifications. Assessment for potential risk factor modification, dietary therapy or pharmacologic therapy may be warranted, if clinically indicated. Electronically Signed   By: Agustin Cree M.D.   On: 05/01/2023 15:51   ECHOCARDIOGRAM LIMITED Result Date: 05/01/2023    ECHOCARDIOGRAM LIMITED REPORT   Patient Name:   SOTA HETZ Date of Exam: 04/30/2023 Medical Rec #:  [id number]       Height:       71.0 in Accession #:    [id number]      Weight:       233.7 lb Date of Birth:  08/19/1970       BSA:          2.253 m Patient Age:    52 years        BP:           127/89 mmHg Patient Gender: M               HR:           95 bpm. Exam Location:  [facility] Procedure: 2D Echo and Limited Echo Indications:     I31.3 Pericardial Effusion  History:         Patient has prior history of Echocardiogram examinations, most                  recent 04/24/2023. Cardiomyopathy, Stroke, Arrythmias:Atrial                  Fibrillation; Risk Factors:Hypertension and Diabetes.  Sonographer:     Sedonia Small Rodgers-Jones RDCS Referring Phys:  [id number] CHRISTOPHER RONALD BERGE Diagnosing Phys: Julien Nordmann MD IMPRESSIONS  1. Left ventricular ejection fraction, by estimation, is 40 to 45%. The left ventricle has mildly decreased function. The left ventricle demonstrates global hypokinesis. There is moderate left ventricular hypertrophy. Left ventricular diastolic parameters  are indeterminate.  2. Right ventricular systolic function is mildly reduced. The right ventricular size is normal. Tricuspid regurgitation signal is inadequate for assessing PA pressure.  3. Moderate pericardial effusion. There is no evidence of cardiac tamponade.  4. The mitral valve is normal in structure. No evidence of mitral valve regurgitation. No evidence of mitral stenosis.  5. The aortic valve is normal in structure. Aortic valve regurgitation is not visualized. No aortic stenosis is present.  6. The inferior vena cava is dilated in size with >50% respiratory variability, suggesting right atrial pressure of 8 mmHg. FINDINGS  Left Ventricle: Left ventricular ejection fraction, by estimation, is 40 to 45%. The left ventricle has mildly decreased function. The left ventricle demonstrates global hypokinesis. The left ventricular internal cavity size was normal in size. There is  moderate left ventricular hypertrophy. Left ventricular diastolic parameters are indeterminate. Right Ventricle: The right ventricular size is normal. No increase in right ventricular wall thickness. Right ventricular  systolic function is mildly reduced. Tricuspid regurgitation signal is inadequate for assessing PA pressure. Left Atrium: Left atrial size was normal in size. Right Atrium: Right atrial size was normal in size. Pericardium: A moderately sized pericardial effusion is present. There is no evidence of cardiac tamponade. Mitral Valve: The mitral valve is normal in structure. No evidence of mitral valve stenosis. Tricuspid Valve: The tricuspid valve is normal in structure. Tricuspid valve regurgitation is not demonstrated. No evidence of tricuspid stenosis. Aortic Valve: The aortic valve is normal in structure. Aortic valve regurgitation is not visualized. No aortic stenosis is present. Pulmonic Valve: The pulmonic valve was normal in structure. Pulmonic valve regurgitation is not visualized. No evidence of pulmonic stenosis.  Aorta: The aortic root is normal in size and structure. Venous: The inferior vena cava is dilated in size with greater than 50% respiratory variability, suggesting right atrial pressure of 8 mmHg. IAS/Shunts: No atrial level shunt detected by color flow Doppler. LEFT VENTRICLE PLAX 2D LVIDd:         4.90 cm LVIDs:         4.10 cm LV PW:         1.30 cm LV IVS:        1.20 cm  IVC IVC diam: 2.50 cm LEFT ATRIUM         Index LA diam:    5.20 cm 2.31 cm/m   AORTA Ao Root diam: 3.80 cm Julien Nordmann MD Electronically signed by Julien Nordmann MD Signature Date/Time: 05/01/2023/11:13:46 AM    Final    DG Chest Port 1 View Result Date: 04/30/2023 CLINICAL DATA:  Status post thoracentesis EXAM: PORTABLE CHEST 1 VIEW COMPARISON:  CT chest dated April 30, 2023. Chest radiograph dated April 29, 2023. FINDINGS: Status post thoracentesis with moderate-to-large left pleural effusion, decreased in size compared to the prior exam. There is improved aeration of the left upper lung. Persistent left basilar atelectasis. Small to moderate-sized layering right pleural effusion with associated basilar atelectasis. No pneumothorax. Central pulmonary vascular congestion. Similar enlargement of the cardiac silhouette. Stable right chest Port-A-Cath in place. Visualized osseous structures are unchanged. IMPRESSION: 1. Status post thoracentesis with decreased size of a moderate-to-large left pleural effusion and improved aeration within the left upper lung. Left basilar atelectasis. 2. Small to moderate-sized layering right pleural effusion with associated basilar atelectasis. 3. Central pulmonary vascular congestion. Electronically Signed   By: Hart Robinsons M.D.   On: 04/30/2023 12:10   US THORACENTESIS ASP PLEURAL SPACE W/IMG GUIDE Result Date: 04/30/2023 INDICATION: Patient with history of CHF, CKD3, cirrhosis, stage 4 gastric adenocarcinoma admitted for chest pain and dyspnea. Imaging shows new bilateral pleural effusions.  Request for diagnostic and therapeutic thoracentesis. EXAM: ULTRASOUND GUIDED LEFT THORACENTESIS MEDICATIONS: 7 mL 1% lidocaine COMPLICATIONS: None immediate. PROCEDURE: An ultrasound guided thoracentesis was thoroughly discussed with the patient and questions answered. The benefits, risks, alternatives and complications were also discussed. The patient understands and wishes to proceed with the procedure. Written consent was obtained. Ultrasound was performed to localize and mark an adequate pocket of fluid in the left chest. The area was then prepped and draped in the normal sterile fashion. 1% Lidocaine was used for local anesthesia. Under ultrasound guidance a 6 Fr Safe-T-Centesis catheter was introduced. Thoracentesis was performed. The catheter was removed and a dressing applied. FINDINGS: A total of approximately 1.5 L of clear yellow fluid was removed. Samples were sent to the laboratory as requested by the clinical team. IMPRESSION: Successful ultrasound guided left  thoracentesis yielding 1.5 L of pleural fluid. Performed by Lynnette Caffey, PA-C Electronically Signed   By: Malachy Moan M.D.   On: 04/30/2023 12:04   CT Angio Chest PE W and/or Wo Contrast Result Date: 04/30/2023 CLINICAL DATA:  53 year old male with history of shortness of breath and hypoxia. Left-sided chest pain. History of gastric cancer. * Tracking Code: BO * EXAM: CT ANGIOGRAPHY CHEST WITH CONTRAST TECHNIQUE: Multidetector CT imaging of the chest was performed using the standard protocol during bolus administration of intravenous contrast. Multiplanar CT image reconstructions and MIPs were obtained to evaluate the vascular anatomy. RADIATION DOSE REDUCTION: This exam was performed according to the departmental dose-optimization program which includes automated exposure control, adjustment of the mA and/or kV according to patient size and/or use of iterative reconstruction technique. CONTRAST:  50mL OMNIPAQUE IOHEXOL 350 MG/ML  SOLN COMPARISON:  CT of the chest, abdomen and pelvis 04/10/2023. FINDINGS: Cardiovascular: Study is slightly limited by suboptimal contrast bolus. With these limitations in mind, there is no central, lobar or segmental sized filling defect in the pulmonary arterial tree to indicate clinically relevant pulmonary embolism. Smaller subsegmental sized pulmonary emboli can not be entirely excluded. Heart size is normal. Moderate to large pericardial effusion. No definite pericardial calcification. There is aortic atherosclerosis, as well as atherosclerosis of the great vessels of the mediastinum and the coronary arteries, including calcified atherosclerotic plaque in the left anterior descending coronary artery. Mild calcifications of the aortic valve and mitral annulus. Right internal jugular single-lumen porta cath with tip terminating at the superior cavoatrial junction. Mediastinum/Nodes: No pathologically enlarged mediastinal or hilar lymph nodes. Esophagus is unremarkable in appearance. No axillary lymphadenopathy. Lungs/Pleura: Large bilateral pleural effusions (left-greater-than-right), predominantly lying dependently. Associated with these is extensive passive atelectasis in the lungs bilaterally. No definite consolidative airspace disease. Mild diffuse ground-glass attenuation and mild interlobular septal thickening in the lungs suggesting a background of mild interstitial pulmonary edema. Several small scattered pulmonary nodules are noted measuring 5 mm or less in size, similar to several prior examinations dating back to 2023, favored to be benign. No larger more suspicious appearing pulmonary nodules or masses are noted. Upper Abdomen: Aortic atherosclerosis. Musculoskeletal: There are no aggressive appearing lytic or blastic lesions noted in the visualized portions of the skeleton. Review of the MIP images confirms the above findings. IMPRESSION: 1. No central, lobar or segmental sized pulmonary embolism.  2. Moderate to large pericardial effusion. 3. Large bilateral pleural effusions (left-greater-than-right) with extensive passive atelectasis in the lungs bilaterally. 4. Mild interstitial pulmonary edema. 5. Aortic atherosclerosis, in addition to left anterior descending coronary artery disease. 6. There are calcifications of the aortic valve and mitral annulus. Echocardiographic correlation for evaluation of potential valvular dysfunction may be warranted if clinically indicated. 7. Small pulmonary nodules measuring 5 mm or less in size, stable compared to prior examinations, favored to be benign (although continued attention on future follow-up imaging is recommended given the patient's history of gastric cancer). Aortic Atherosclerosis (ICD10-I70.0). Electronically Signed   By: Trudie Reed M.D.   On: 04/30/2023 07:24   DG Chest 2 View Result Date: 04/29/2023 CLINICAL DATA:  Chest pain EXAM: CHEST - 2 VIEW COMPARISON:  04/23/2023 FINDINGS: Persistent consolidation/atelectasis at the left lung base with probable adjacent effusion. Some increase in perihilar pulmonary vascular congestion suspected. Mild thickening of or fluid in the interlobar fissures as before. No pneumothorax. Stable right IJ port catheter. Heart size and mediastinal contours are within normal limits. Visualized bones unremarkable. IMPRESSION: 1. Persistent left  basilar consolidation/atelectasis with probable adjacent effusion. 2. Increased perihilar pulmonary vascular congestion. Electronically Signed   By: Corlis Leak M.D.   On: 04/29/2023 17:24   ECHOCARDIOGRAM COMPLETE Result Date: 04/24/2023    ECHOCARDIOGRAM REPORT   Patient Name:   AIMAN NOE Date of Exam: 04/24/2023 Medical Rec #:  [id number]       Height:       71.0 in Accession #:    [id number]      Weight:       245.0 lb Date of Birth:  Dec 02, 1970       BSA:          2.298 m Patient Age:    52 years        BP:           157/91 mmHg Patient Gender: M               HR:            90 bpm. Exam Location:  [facility] Procedure: 2D Echo, Cardiac Doppler and Color Doppler Indications:     CHF  History:         Patient has prior history of Echocardiogram examinations, most                  recent 11/22/2022. CHF, Stroke, Arrythmias:Atrial Fibrillation,                  Signs/Symptoms:Fatigue and Shortness of Breath; Risk                  Factors:Hypertension, Diabetes, Dyslipidemia and Sleep Apnea.                  CKD.  Sonographer:     Mikki Harbor Referring Phys:  [id number] Tresa Endo A GRIFFITH Diagnosing Phys: Julien Nordmann MD  Sonographer Comments: Patient is obese. IMPRESSIONS  1. Left ventricular ejection fraction, by estimation, is 40 to 45%. The left ventricle has mildly decreased function. The left ventricle demonstrates global hypokinesis. There is moderate left ventricular hypertrophy. Left ventricular diastolic parameters are indeterminate.  2. Right ventricular systolic function is mildly reduced. The right ventricular size is normal.  3. Left atrial size was moderately dilated.  4. A small pericardial effusion is present.  5. The mitral valve is normal in structure. Mild mitral valve regurgitation. No evidence of mitral stenosis.  6. The aortic valve is normal in structure. Aortic valve regurgitation is not visualized. No aortic stenosis is present.  7. There is mild dilatation of the aortic root, measuring 41 mm. There is mild dilatation of the ascending aorta, measuring 42 mm.  8. The inferior vena cava is normal in size with greater than 50% respiratory variability, suggesting right atrial pressure of 3 mmHg. FINDINGS  Left Ventricle: Left ventricular ejection fraction, by estimation, is 40 to 45%. The left ventricle has mildly decreased function. The left ventricle demonstrates global hypokinesis. The left ventricular internal cavity size was normal in size. There is  moderate left ventricular hypertrophy. Left ventricular diastolic parameters are indeterminate. Right Ventricle: The  right ventricular size is normal. No increase in right ventricular wall thickness. Right ventricular systolic function is mildly reduced. Left Atrium: Left atrial size was moderately dilated. Right Atrium: Right atrial size was normal in size. Pericardium: A small pericardial effusion is present. Mitral Valve: The mitral valve is normal in structure. Mild mitral annular calcification. Mild mitral valve regurgitation. No evidence of mitral valve stenosis. MV peak gradient, 4.4 mmHg.  The mean mitral valve gradient is 2.0 mmHg. Tricuspid Valve: The tricuspid valve is normal in structure. Tricuspid valve regurgitation is not demonstrated. No evidence of tricuspid stenosis. Aortic Valve: The aortic valve is normal in structure. Aortic valve regurgitation is not visualized. No aortic stenosis is present. Aortic valve mean gradient measures 2.7 mmHg. Aortic valve peak gradient measures 5.0 mmHg. Aortic valve area, by VTI measures 3.00 cm. Pulmonic Valve: The pulmonic valve was normal in structure. Pulmonic valve regurgitation is not visualized. No evidence of pulmonic stenosis. Aorta: The aortic root is normal in size and structure. There is mild dilatation of the aortic root, measuring 41 mm. There is mild dilatation of the ascending aorta, measuring 42 mm. Venous: The inferior vena cava is normal in size with greater than 50% respiratory variability, suggesting right atrial pressure of 3 mmHg. IAS/Shunts: No atrial level shunt detected by color flow Doppler.  LEFT VENTRICLE PLAX 2D LVIDd:         5.40 cm LVIDs:         2.70 cm LV PW:         1.50 cm LV IVS:        1.60 cm LVOT diam:     2.20 cm LV SV:         60 LV SV Index:   26 LVOT Area:     3.80 cm  RIGHT VENTRICLE RV Basal diam:  3.80 cm RV Mid diam:    3.70 cm LEFT ATRIUM             Index        RIGHT ATRIUM           Index LA diam:        4.90 cm 2.13 cm/m   RA Area:     20.10 cm LA Vol (A2C):   73.9 ml 32.15 ml/m  RA Volume:   57.10 ml  24.84 ml/m LA Vol  (A4C):   97.4 ml 42.38 ml/m LA Biplane Vol: 87.3 ml 37.98 ml/m  AORTIC VALVE                    PULMONIC VALVE AV Area (Vmax):    3.17 cm     PV Vmax:       0.88 m/s AV Area (Vmean):   2.72 cm     PV Peak grad:  3.1 mmHg AV Area (VTI):     3.00 cm AV Vmax:           111.67 cm/s AV Vmean:          77.900 cm/s AV VTI:            0.200 m AV Peak Grad:      5.0 mmHg AV Mean Grad:      2.7 mmHg LVOT Vmax:         93.25 cm/s LVOT Vmean:        55.650 cm/s LVOT VTI:          0.158 m LVOT/AV VTI ratio: 0.79  AORTA Ao Root diam: 4.10 cm Ao Asc diam:  4.20 cm MITRAL VALVE MV Area (PHT): 3.65 cm    SHUNTS MV Area VTI:   2.63 cm    Systemic VTI:  0.16 m MV Peak grad:  4.4 mmHg    Systemic Diam: 2.20 cm MV Mean grad:  2.0 mmHg MV Vmax:       1.05 m/s MV Vmean:      58.7 cm/s MV  Decel Time: 208 msec MV E velocity: 84.00 cm/s Julien Nordmann MD Electronically signed by Julien Nordmann MD Signature Date/Time: 04/24/2023/1:11:41 PM    Final    DG Chest 2 View Result Date: 04/23/2023 CLINICAL DATA:  Congestion, shortness of breath.  COPD EXAM: CHEST - 2 VIEW COMPARISON:  10/27/2022 FINDINGS: Right Port-A-Cath in place with the tip at the cavoatrial junction. Cardiomegaly with vascular congestion. Left pleural effusion with perihilar and lower lobe airspace opacities, left greater than right. No acute bony abnormality. IMPRESSION: Cardiomegaly with vascular congestion and bilateral perihilar opacities and lower lobe opacities, left greater than right. Favor asymmetric edema. Cannot exclude pneumonia. Small to moderate left pleural effusion. Electronically Signed   By: Charlett Nose M.D.   On: 04/23/2023 17:40   CT CHEST ABDOMEN PELVIS WO CONTRAST Result Date: 04/15/2023 CLINICAL DATA:  Restaging gastric cancer EXAM: CT CHEST, ABDOMEN AND PELVIS WITHOUT CONTRAST TECHNIQUE: Multidetector CT imaging of the chest, abdomen and pelvis was performed following the standard protocol without IV contrast. RADIATION DOSE REDUCTION: This  exam was performed according to the departmental dose-optimization program which includes automated exposure control, adjustment of the mA and/or kV according to patient size and/or use of iterative reconstruction technique. COMPARISON:  PET-CT dated 12/31/2022 FINDINGS: CT CHEST FINDINGS Cardiovascular: Mild cardiomegaly.  Small pericardial effusion, new. No evidence of thoracic aortic aneurysm. Atherosclerotic calcifications of the aortic arch. Mild coronary atherosclerosis of the LAD and right coronary artery. Right chest port terminates at the cavoatrial junction. Mediastinum/Nodes: Dominant 12 mm short axis AP window node, similar. However, additional mediastinal nodes are favored to be mildly progressive, including a 2 mm short axis low right paratracheal node (series 2/image 23) and new prevascular nodes measuring up to 7 mm short axis (series 2/image 21). Visualized thyroid is unremarkable. Lungs/Pleura: 5 mm triangular subpleural nodule in the right lower lobe (series 4/image 109), unchanged, likely benign. Additional subpleural nodularity in the right upper and right middle lobes, measuring up to 5 mm, benign. No new/suspicious pulmonary nodules. Mild centrilobular emphysematous changes, upper lung predominant. Moderate left and small to moderate right pleural effusions, new. Associated lingular and left lower lobe compressive atelectasis. No pneumothorax. Musculoskeletal: Degenerative changes of the lower thoracic spine. CT ABDOMEN PELVIS FINDINGS Hepatobiliary: Unenhanced liver is notable for a mildly macronodular contour but no focal hepatic lesion. Cholelithiasis, without associated inflammatory changes. No intrahepatic or extrahepatic duct dilatation. Pancreas: Within normal limits. Spleen: Within normal limits. Adrenals/Urinary Tract: Adrenal glands are within normal limits. Kidneys are within normal limits. Renal vascular calcifications. Mild left hydronephrosis with narrowing at the UVJ.  Thick-walled bladder, although underdistended. 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 No evidence of bowel obstruction. Appendix is not discretely visualized. Sigmoid diverticulosis, without evidence of diverticulitis. Large complex midline lower abdominal ventral hernia containing multiple loops of nondilated bowel (series 2/image 106). Vascular/Lymphatic: No evidence of abdominal aortic aneurysm. Atherosclerotic calcifications of the abdominal aorta and branch vessels, although vessels remain patent. Perigastric soft tissue implant measuring 3.2 x 2.3 cm (series 2/image 53), previously 3.7 x 2.1 cm, favored to be mildly improved. Additional small periaortic/retroperitoneal nodes measuring up to 7 mm short axis (series 2/image 89), grossly unchanged. Reproductive: Prostate is unremarkable. Other: Trace pelvic ascites, new. Musculoskeletal: Degenerative changes of the lumbar spine. 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 Mediastinal, perigastric, and retroperitoneal nodal metastases, overall grossly  unchanged, although possibly mildly progressive in the chest. Moderate left and small to moderate right pleural effusions, new. Small pericardial effusion, new. Trace pelvic ascites, new. Additional stable ancillary findings as above. Aortic Atherosclerosis (ICD10-I70.0) and Emphysema (ICD10-J43.9). Electronically Signed   By: Charline Bills M.D.   On: 04/15/2023 00:24    Assessment and plan-   # Stage IV gastric cancer, status post first-line treatment with FOLFOX x 12 cycles currently on 5-FU maintenance, most recent restaging CT shows overall stable disease. CT abdomen pelvis showed indeterminate interval increased multi station periportal, retroaortic, and pelvic  lymphadenopathy and proximal ureter mural thickening, suspicious for disease progression. Pleural effusion cytology negative for malignancy Long-term prognosis is poor as this is not a curable condition and he is not a chemotherapy candidate with his current condition.     # Pericardial effusion and bilateral pleural effusion. Pleural effusion cytology negative for malignancy BNP is normal and cardiology felt pleural effusion is not due to CHF.  pericardial effusion need to be monitored and there is plan of repeat echo next week.  ? Fluid overload due to worse of renal function, nephrology was consulted for management.  He has a history of cirrhosis as well.    # AKI on CKD received IV contrast  Per nephrology, not hemodialysis candidate.  Left Uretal irregularity, possible metastatic disease, I do not think that causes his kidney failure, stenting may not help with kidney function. Urology on board.   Overall prognosis is poor due to high risk of any procedure, multiple co-morbidities, I think hospice is reasonable. Palliative care service provide to follow for further discussion.  I called sister on both her cell phone and work phone and not able to reach her.  Thank you for allowing me to participate in the care of this patient.   Rickard Patience, MD, PhD Hematology Oncology 05/05/2023

## 2023-05-05 NOTE — Progress Notes (Signed)
Urology Inpatient Progress Note  Subjective: No acute events overnight.  He is afebrile, VSS.  He is satting well on 4 L nasal cannula. Creatinine rising, 6.45.  Markedly hyperkalemic, 6.1. Renal ultrasound yesterday redemonstrates moderate left hydronephrosis He denies flank pain.  He has been n.p.o. since midnight in advance of possible left ureteral stent placement.  Anti-infectives: Anti-infectives (From admission, onward)    None       Current Facility-Administered Medications  Medication Dose Route Frequency Provider Last Rate Last Admin   albumin human 25 % solution 12.5 g  12.5 g Intravenous Daily Vida Rigger, MD 60 mL/hr at 05/05/23 0928 12.5 g at 05/05/23 0928   apixaban (ELIQUIS) tablet 5 mg  5 mg Oral BID Mila Merry A, RPH   5 mg at 05/04/23 2209   atorvastatin (LIPITOR) tablet 40 mg  40 mg Oral q1800 Kathrynn Running, MD   40 mg at 05/04/23 1723   busPIRone (BUSPAR) tablet 7.5 mg  7.5 mg Oral BID Kathrynn Running, MD   7.5 mg at 05/04/23 2209   Chlorhexidine Gluconate Cloth 2 % PADS 6 each  6 each Topical Daily Kathrynn Running, MD   6 each at 05/05/23 1610   guaiFENesin-dextromethorphan (ROBITUSSIN DM) 100-10 MG/5ML syrup 10 mL  10 mL Oral Q4H PRN Enedina Finner, MD       lip balm (BLISTEX) ointment   Topical PRN Hunt, Madison H, RPH       loratadine (CLARITIN) tablet 10 mg  10 mg Oral Daily PRN Enedina Finner, MD       metoprolol tartrate (LOPRESSOR) tablet 12.5 mg  12.5 mg Oral BID Kathrynn Running, MD   12.5 mg at 05/04/23 2210   midodrine (PROAMATINE) tablet 10 mg  10 mg Oral TID PRN Enedina Finner, MD       Muscle Rub CREA 1 Application  1 Application Topical PRN Enedina Finner, MD       ondansetron [facility]) injection 4 mg  4 mg Intravenous Q6H PRN Enedina Finner, MD   4 mg at 05/05/23 0524   ondansetron (ZOFRAN-ODT) disintegrating tablet 4 mg  4 mg Oral Q8H PRN Kathrynn Running, MD   4 mg at 05/03/23 2048   oxyCODONE (Oxy IR/ROXICODONE) immediate release tablet 5  mg  5 mg Oral Q6H PRN Kathrynn Running, MD   5 mg at 05/03/23 2048   polyvinyl alcohol (LIQUIFILM TEARS) 1.4 % ophthalmic solution 1 drop  1 drop Both Eyes PRN Enedina Finner, MD       sertraline (ZOLOFT) tablet 75 mg  75 mg Oral Daily Kathrynn Running, MD   75 mg at 05/04/23 0930   sodium chloride flush (NS) 0.9 % injection 10-40 mL  10-40 mL Intracatheter Q12H Wouk, Wilfred Curtis, MD   10 mL at 05/05/23 0845   sodium zirconium cyclosilicate (LOKELMA) packet 10 g  10 g Oral BID Enedina Finner, MD         Objective: Vital signs in last 24 hours: Temp:  [97.4 F (36.3 C)-97.8 F (36.6 C)] 97.4 F (36.3 C) (02/03 0334) Pulse Rate:  [53-117] 79 (02/03 0334) Resp:  [18-20] 20 (02/03 0334) BP: (128-140)/(92-97) 129/96 (02/03 0334) SpO2:  [90 %-99 %] 97 % (02/03 0334)  Intake/Output from previous day: 02/02 0701 - 02/03 0700 In: 100 [IV Piggyback:100] Out: 0  Intake/Output this shift: No intake/output data recorded.  Physical Exam Vitals and nursing note reviewed.  Constitutional:      General: He is  not in acute distress.    Appearance: He is ill-appearing. He is not toxic-appearing or diaphoretic.  HENT:     Head: Normocephalic and atraumatic.  Pulmonary:     Effort: Pulmonary effort is normal. No respiratory distress.  Skin:    General: Skin is warm and dry.  Neurological:     Mental Status: He is alert and oriented to person, place, and time.  Psychiatric:        Mood and Affect: Mood normal.        Behavior: Behavior normal.    Lab Results:  No results for input(s): "WBC", "HGB", "HCT", "PLT" in the last 72 hours. BMET Recent Labs    05/04/23 0337 05/05/23 0439  NA 133* 135  K 5.4* 6.1*  CL 100 102  CO2 18* 16*  GLUCOSE 135* 184*  BUN 96* 102*  CREATININE 5.09* 6.45*  CALCIUM 9.0 9.0   Studies/Results: DG Chest Port 1 View Result Date: 05/04/2023 CLINICAL DATA:  Pleural effusion, stage IV gastric cancer. EXAM: PORTABLE CHEST 1 VIEW COMPARISON:  04/30/2023 is  CT chest 04/30/2023. FINDINGS: Right IJ power port tip is in the low SVC or at the SVC RA junction. Heart is enlarged, stable. Diffuse mixed interstitial and airspace opacification with bilateral pleural effusions. Findings are similar to 04/30/2023. IMPRESSION: Congestive heart failure. Electronically Signed   By: Leanna Battles M.D.   On: 05/04/2023 17:43   DG Abd 1 View Result Date: 05/04/2023 CLINICAL DATA:  Hydronephrosis, stage IV gastric cancer EXAM: ABDOMEN - 1 VIEW COMPARISON:  CT scan 05/01/2023 FINDINGS: A central line terminates in the right atrium. Blunting of both costophrenic angles compatible with bilateral pleural effusions. Thoracolumbar spondylosis. Mild to moderate enlargement of the cardiopericardial silhouette; the 05/01/2023 CT scan showed a pericardial effusion. Formed stool noted in the colon. Dilute retained contrast in the left renal collecting system which appears moderately dilated. IMPRESSION: 1. Dilute retained contrast in the left renal collecting system which appears moderately dilated. 2. Mild to moderate enlargement of the cardiopericardial silhouette, compatible with pericardial effusion. 3. Bilateral pleural effusions. 4. Formed stool in the colon. Electronically Signed   By: Gaylyn Rong M.D.   On: 05/04/2023 17:43   US RENAL Result Date: 05/04/2023 CLINICAL DATA:  Hydronephrosis. EXAM: RENAL / URINARY TRACT ULTRASOUND COMPLETE COMPARISON:  May 01, 2023. FINDINGS: Right Kidney: Renal measurements: 9.2 x 5.9 x 5.1 cm = volume: 145 mL. Echogenicity within normal limits. No mass or hydronephrosis visualized. Left Kidney: Renal measurements: 10.8 x 5.9 x 4.9 cm = volume: 163 mL. Echogenicity within normal limits. No mass visualized. Moderate hydronephrosis is noted. Two calculi are noted, each measuring approximately 7 mm. Bladder: Not visualized. Other: None. IMPRESSION: Moderate left hydronephrosis. Nonobstructive left nephrolithiasis. Normal right kidney.  Electronically Signed   By: Lupita Raider M.D.   On: 05/04/2023 17:40   Assessment & Plan: 53 year old male with metastatic gastric carcinoma admitted with left pleural effusion and CHF exacerbation with imaging findings of new left hydronephrosis due to retroperitoneal met.  Creatinine continues to rise and he is now hyperkalemic.  Unfortunately, he is felt to be a poor candidate for dialysis.  Prognosis remains poor.  I had a lengthy conversation with the patient at the bedside today.  We discussed consideration of left ureteral stent placement to address his new left hydronephrosis.  We discussed that his AKI is multifactorial and hydronephrosis is only one contributor, thus addressing this would not resolve the other factors. We discussed that he  is at increased anesthesia risk due to his multiple comorbidities and hyperkalemia. We also discussed the possibility of failure to place the stent, which would require placement of percutaneous nephrostomy tube instead. Overall, since he states he is asymptomatic of his left hydronephrosis, clinical benefit would be rather limited to somewhat improved renal function alone. His prognosis would remain poor. When asked if he would like to proceed with stent placement in light of the above, he states "that's fine."  I subsequently spoke with his sister, [name], via telephone.  She reports that he has been having left flank pain despite his reports to the contrary.  I reiterated the conversation as above.  She states that she is unsure how she would like to proceed and would like additional time to discuss further with additional family members.    No plan for stent placement today.  He may have a diet. Will continue to follow and revisit possible stent later this week.  Carman Ching, PA-C 05/05/2023

## 2023-05-05 NOTE — Progress Notes (Signed)
[facility]  PROGRESS NOTE   Subjective:   Patient seen resting in bed No family present Slow to respond to questioning  Creatinine 6.45 Potassium 6.1  Objective:  Vital signs: Blood pressure (!) 142/95, pulse 77, temperature (!) 97.5 F (36.4 C), temperature source Oral, resp. rate 19, height 5\' 11"  (1.803 m), weight 106 kg, SpO2 97%.  Intake/Output Summary (Last 24 hours) at 05/05/2023 1359 Last data filed at 05/05/2023 0841 Gross per 24 hour  Intake 100 ml  Output 0 ml  Net 100 ml   Filed Weights   04/29/23 1659  Weight: 106 kg     Physical Exam: General:  No acute distress  Head:  Normocephalic, atraumatic. Moist oral mucosal membranes  Eyes:  Anicteric  Neck:  Supple  Lungs:   Clear to auscultation, normal effort  Heart:  S1S2 no rubs  Abdomen:   Soft, nontender, bowel sounds present  Extremities: 2+ peripheral edema.  Neurologic: Slow to respond, following commands  Skin:  No lesions  Access: None    Basic Metabolic Panel: Recent Labs  Lab 05/01/23 0326 05/02/23 1054 05/03/23 0540 05/04/23 0337 05/05/23 0439  NA 135 132* 133* 133* 135  K 4.8 4.5 4.5 5.4* 6.1*  CL 103 101 99 100 102  CO2 20* 19* 17* 18* 16*  GLUCOSE 114* 165* 138* 135* 184*  BUN 62* 78* 87* 96* 102*  CREATININE 2.84* 3.52* 4.09* 5.09* 6.45*  CALCIUM 8.6* 8.6* 8.9 9.0 9.0   GFR: Estimated Creatinine Clearance: 16.4 mL/min (A) (by C-G formula based on SCr of 6.45 mg/dL (H)).  Liver Function Tests: Recent Labs  Lab 05/01/23 0326  AST 30  ALT 22  ALKPHOS 143*  BILITOT 0.9  PROT 7.1  7.3  ALBUMIN 3.0*   No results for input(s): "LIPASE", "AMYLASE" in the last 168 hours. No results for input(s): "AMMONIA" in the last 168 hours.  CBC: Recent Labs  Lab 04/29/23 1655 05/01/23 0326 05/02/23 1054  WBC 5.9 7.6 7.0  HGB 11.5* 11.0* 10.6*  HCT 35.9* 35.3* 33.2*  MCV 90.2 91.0 89.2  PLT 182 188 228     HbA1C: Hemoglobin A1C  Date/Time Value Ref Range  Status  02/20/2023 03:01 PM 6.5 (A) 4.0 - 5.6 % Final   HbA1c, POC (controlled diabetic range)  Date/Time Value Ref Range Status  06/03/2018 09:03 AM 7.0 0.0 - 7.0 % Final  12/16/2017 01:50 PM 5.4 0.0 - 7.0 % Final   Hgb A1c MFr Bld  Date/Time Value Ref Range Status  11/22/2022 08:44 AM 7.0 (H) 4.8 - 5.6 % Final    Comment:    (NOTE) Pre diabetes:          5.7%-6.4%  Diabetes:              >6.4%  Glycemic control for   <7.0% adults with diabetes   09/27/2022 04:50 PM 5.9 (H) 4.8 - 5.6 % Final    Comment:    (NOTE)         Prediabetes: 5.7 - 6.4         Diabetes: >6.4         Glycemic control for adults with diabetes: <7.0     Urinalysis: No results for input(s): "COLORURINE", "LABSPEC", "PHURINE", "GLUCOSEU", "HGBUR", "BILIRUBINUR", "KETONESUR", "PROTEINUR", "UROBILINOGEN", "NITRITE", "LEUKOCYTESUR" in the last 72 hours.  Invalid input(s): "APPERANCEUR"     Imaging:   CT abdomen and pelvis dated 04/10/2023, CTA chest dated 04/30/2023, nuclear medicine PET dated 12/31/2022   FINDINGS:  Lower chest: Partially imaged central venous catheter terminates at the superior cavoatrial junction. Re-expansion of partially imaged lingula and left lower lobe status post thoracentesis. Diffuse patchy ground-glass opacities of the lingula and partially imaged left lower lobe. Similar large right and decreased moderate left pleural effusions. No pneumothorax. Similar pericardial effusion. Coronary artery calcifications.   Hepatobiliary: No focal hepatic lesions. No intra or extrahepatic biliary ductal dilation. Contracted gallbladder demonstrates cholelithiasis. Adrenals   Pancreas: No focal lesions or main ductal dilation.   Spleen: Normal in size without focal abnormality.   Adrenals/Urinary Tract: Unchanged 1.3 cm left adrenal nodule measures 13 HU, not previously FDG-avid, likely adenoma. No specific follow-up imaging recommended. No right adrenal nodule. Mild  left hydronephrosis contains excreted renal contrast material from recent CTA chest. Contrast is seen to the level of the proximal ureter, which demonstrates mild circumferential mural thickening with adjacent soft tissue irregularity (2:52), inseparable from the anterior psoas. Excreted contrast material within the urinary bladder.   Stomach/Bowel: Decreased conspicuity of mural thickening involving the gastric cardia. No evidence of bowel wall thickening, distention, or inflammatory changes. Normal appendix, which is contained within the hernia.   Vascular/Lymphatic: Aortic atherosclerosis. Partially imaged paraesophageal lymph nodes measuring up to 9 mm (2:2) are unchanged. Continued decrease in size of perigastric node measuring 2.2 x 2.0 cm (2:34), previously 3.7 x 2.1 cm. Interval increased multi station periportal, retroaortic, and pelvic lymphadenopathy, for example   -14 mm portacaval (2:33), previously 13 mm   -10 mm left para-aortic (2:39), previously 5 mm   -14 mm right external iliac (2:74), previously 12 mm   Reproductive: Prostate is unremarkable.   Other: Trace ascites.  No free air or fluid collection.   Musculoskeletal: No acute or abnormal lytic or blastic osseous lesions. Multilevel degenerative changes of the partially imaged thoracic and lumbar spine. Large ventral midline lower abdominal wall hernia containing nonobstructed loops of small and large bowel, as before.   IMPRESSION: 1. Mild left hydronephrosis contains excreted renal contrast material from recent CTA chest, suggestive of delayed nephrogram. Contrast is seen to the level of the proximal ureter, which demonstrates mild circumferential mural thickening with adjacent soft tissue irregularity, inseparable from the anterior psoas. Findings are suspicious for ureteral involvement by metastatic disease, less likely ascending urinary tract infection. Recommend correlation with urinalysis. 2.  Interval increased multi station periportal, retroaortic, and pelvic lymphadenopathy, indeterminate, however suspicious for metastatic disease. 3. Decreased conspicuity of mural thickening involving the gastric cardia. Decreased size of perigastric nodule. 4. Re-expansion of partially imaged lingula and left lower lobe status post thoracentesis. Diffuse patchy ground-glass opacities of the lingula and partially imaged left lower lobe, which may represent re-expansion pulmonary edema. 5. Similar large right and decreased moderate left pleural effusions. 6. Similar pericardial effusion. 7. Aortic Atherosclerosis (ICD10-I70.0). Coronary artery calcifications. Assessment for potential risk factor modification, dietary therapy or pharmacologic therapy may be warranted, if clinically indicated.     Electronically Signed   By: Agustin Cree M.D.   On: 05/01/2023 15:51  Medications:    albumin human 12.5 g (05/05/23 0928)    apixaban  5 mg Oral BID   atorvastatin  40 mg Oral q1800   busPIRone  7.5 mg Oral BID   Chlorhexidine Gluconate Cloth  6 each Topical Daily   metoprolol tartrate  12.5 mg Oral BID   sertraline  75 mg Oral Daily   sodium chloride flush  10-40 mL Intracatheter Q12H   sodium zirconium cyclosilicate  10 g Oral BID  Assessment/ Plan:     53 y.o. male with a PMHx of stage IV adenocarcinoma of stomach, cirrhosis, edema, hypertension, congestive heart failure, diabetes, history of stroke, and chronic kidney disease now admitted with history of worsening edema and congestive heart failure.  On admission he had CT angiogram of the chest with IV contrast.  Since then the renal function started to get worse.  The CT scan showed left-sided hydronephrosis.  He has been on losartan and torsemide as outpatient.     Principal Problem:   Pleural effusion Active Problems:   Moderate obstructive sleep apnea   History of CVA (cerebrovascular accident)   Other cirrhosis of liver  (HCC)   HFrEF (heart failure with reduced ejection fraction) (HCC)   Paroxysmal atrial fibrillation (HCC)   Gastric cancer (HCC)   Acute on chronic systolic heart failure (HCC)   Chronic kidney disease, stage 3b (HCC)   Chemotherapy-induced neuropathy (HCC)   PAF (paroxysmal atrial fibrillation) (HCC)   Diabetes mellitus (HCC)   Class 1 obesity with serious comorbidity in adult   Benign essential HTN   Status post thoracentesis   Palliative care encounter   Elevated troponin   Chest pain   #1: Acute kidney injury on chronic kidney disease: The chronic kidney disease can be secondary to hypertension complicated by diabetic kidney disease.  Acute kidney injury is most likely secondary to contrast nephropathy with the possibility of left hydronephrosis.  Patient may have metastatic disease of the ureters.  Urology consulted and considering stent placement, patient not a candidate for nephrostomy tube.   Creatinine remains elevated, 6.45 from 5.09.  Would encourage staff to document all urinary output.  Due to poor prognosis of concurrent diagnosis, renal replacement therapy would not offer any benefit for this patient.  We will continue to monitor renal indices, however hesitant to offer any dialysis modalities at this time.   #2: Pericardial effusion/pleural effusion: Will continue to monitor closely.  Will use low-dose diuretics as needed.  Thoracentesis with 1.5 L fluid removed.   #3: Gastric carcinoma: Patient has stage IV adenocarcinoma.  Being followed by oncology.  Poor prognosis.   #4:  Congestive heart failure/cirrhosis/edema: Will continue to monitor closely.  Diuretics on as needed basis.      LOS: 5 [facility] 2/3/20251:59 PM

## 2023-05-05 NOTE — Progress Notes (Signed)
Palliative Medicine [facility] at [facility] [phone] [phone]   Name: DERRAL COLUCCI Date: 05/05/2023 MRN: [id number]  DOB: 08/06/1970  Patient Care Team: Debera Lat, Cordelia Poche as PCP - General (Physician Assistant) Debbe Odea, MD as PCP - Cardiology (Cardiology) Toney Reil, MD as Consulting Physician (Gastroenterology) Toney Reil, MD as Consulting Physician (Gastroenterology) Rickard Patience, MD as Consulting Physician (Oncology) Benita Gutter, RN as Oncology Nurse Navigator    REASON FOR CONSULTATION: Deontay D Nester is a 53 y.o. male with multiple medical problems including history of recurrent CVA, CHF, OSA, cirrhosis, A-fib, CKD stage IIIb, and stage IV gastric adenocarcinoma, most recently on treatment with FOLFOX.  Patient was hospitalized 04/23/2023 to 04/28/2023 with CHF.  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  Patient now readmitted 04/30/2023 again with CHF.  Palliative care consulted to address goals. .    CODE STATUS: DNR  PAST MEDICAL HISTORY: Past Medical History:  Diagnosis Date   Acute ischemic stroke (HCC) 09/06/2017   Acute renal failure superimposed on stage 3a chronic kidney disease (HCC) 07/08/2019   Acute right-sided weakness    Allergies    Anasarca    Anemia 07/10/2017   Arthritis    Cancer (HCC)    stomach and esophageal cancer stage 4   Chicken pox    CKD (chronic kidney disease) stage 3, GFR 30-59 ml/min (HCC) 06/2017   Depression    Elevated troponin 07/08/2019   Heart failure with mid-range ejection fraction (HCC)    a. 06/2017 Echo: EF 40-45%; b. 07/2019 Echo: EF 50-55%; c. 10/2021 Echo: EF 30-35%; d. 10/2022 Echo: EF 55-60%; e. 04/2023 Echo: EF 40-45%, glob HK, mod LVH, mildly reduced RV fxn, mod dil LA, mild MR, Ao root 41mm, asc Ao 42mm.   High cholesterol    History of cardiomyopathy    LVEF 40 to 45% in April 2019 -  subsequently normalized   Hyperbilirubinemia 07/10/2017   Hypertension    Ischemic stroke (HCC)    Small left internal capsule infarct due to lacunar disease   Morbid obesity (HCC)    NICM (nonischemic cardiomyopathy) (HCC)    a. 06/2017 Echo: EF 40-45%; b. 07/2019 Echo: EF 50-55%; c. 10/2021 Echo: EF 30-35%; d. 12/2021 MV: EF 60%, no ischemia/scar; e. 10/2022 Echo: EF 55-60%; f. 04/2023 Echo: EF 40-45%.   Normocytic anemia 12/16/2017   Permanent atrial fibrillation (HCC)    a. CHA2DS2VASc = 5-->eliquis.   Recurrent incisional hernia with incarceration s/p repair 10/22/2017 10/21/2017   Stroke (HCC) 08/2017   "right sided weakness since; getting stronger though" (10/22/2017)   Type 2 diabetes mellitus (HCC)     PAST SURGICAL HISTORY:  Past Surgical History:  Procedure Laterality Date   ABDOMINAL HERNIA REPAIR  2008; 10/22/2017   "scope; OPEN REPAIR INCARCERATED VENTRAL HERNIA   ESOPHAGOGASTRODUODENOSCOPY (EGD) WITH PROPOFOL N/A 09/10/2022   Procedure: ESOPHAGOGASTRODUODENOSCOPY (EGD) WITH PROPOFOL;  Surgeon: Toney Reil, MD;  Location: [facility] ENDOSCOPY;  Service: Gastroenterology;  Laterality: N/A;   ESOPHAGOGASTRODUODENOSCOPY (EGD) WITH PROPOFOL N/A 09/28/2022   Procedure: ESOPHAGOGASTRODUODENOSCOPY (EGD) WITH PROPOFOL;  Surgeon: Jaynie Collins, DO;  Location: [facility] ENDOSCOPY;  Service: Gastroenterology;  Laterality: N/A;   HEMOSTASIS CONTROL  09/28/2022   Procedure: HEMOSTASIS CONTROL;  Surgeon: Jaynie Collins, DO;  Location: [facility] ENDOSCOPY;  Service: Gastroenterology;;   HERNIA REPAIR     KNEE ARTHROSCOPY Right 1989   PORTA CATH INSERTION N/A 09/25/2022   Procedure:  PORTA CATH INSERTION;  Surgeon: Annice Needy, MD;  Location: [facility] INVASIVE CV LAB;  Service: Cardiovascular;  Laterality: N/A;   VENTRAL HERNIA REPAIR N/A 10/22/2017   Procedure: OPEN REPAIR INCARCERATED VENTRAL HERNIA;  Surgeon: Glenna Fellows, MD;  Location: [facility] OR;  Service: General;  Laterality: N/A;     HEMATOLOGY/ONCOLOGY HISTORY:  Oncology History  Gastric cancer (HCC)  09/08/2022 Imaging   CT abdomen pelvis w contrast     09/08/2022 Imaging   CT abdomen pelvis with contrast  Diffuse masslike thickening in the area of the GE junction and upper stomach with the extensive abnormal lymph nodes throughout the abdomen including retrocrural, porta hepatis, retroperitoneum and lesser and greater curve of the stomach. In addition there are areas of suggest peritoneal carcinomatosis. Recommend further evaluation.    No bowel obstruction. Large midline anterior pelvic wall hernia involving small bowel and mesenteric fat with rectus muscle diastasis. Gallstones.   09/19/2022 Cancer Staging   Staging form: Stomach, AJCC 8th Edition - Clinical stage from 09/19/2022: Stage IVB (cTX, cN2, cM1) - Signed by Rickard Patience, MD on 09/19/2022 Stage prefix: Initial diagnosis   09/19/2022 Initial Diagnosis   Gastric cancer (HCC)  09/08/2022, patient presented emergency room for evaluation of abdominal pain and vomiting.  He has also had weight loss.  Dysphagia with solid food for couple of months.  CT abdomen pelvis showed diffuse masslike thickening of the GE junction and upper stomach with extensive abnormal lymph nodes throughout abdomen.  09/10/2022 upper endoscopy showed malignant appearance gastric tumor at the GE junction and in the cardia Biopsied.  Nodular mucosa in the second portion of duodenum.  Biopsied. GE junction mass showed moderate to poorly differentiated adenocarcinoma undermining squamous mucosa with focal area of squamous atypia.  Cannot exclude adenosquamous carcinoma.  Intestinal metaplasia is not identified. Duodenum cold biopsy-reactive duodenitis.  Negative for dysplasia and malignancy. Stomach random biopsy negative for H. pylori, intestinal metaplasia, dysplasia and malignancy.  Stomach Cardia mass biopsy showed moderate to poorly differentiated adenocarcinoma.  Patient's case was  discussed on tumor board on 09/19/2022.  Per Dr. Oneita Kras, nephrology findings of both GE junction mass and gastric cardia mass or similar. HER2 staining negative. PD-L1 TPS<1%, CPS 3, TMB 8.4, MSI can not be accessed.  KMT2D frame shift, GATA6 copy number gain.  Molecular testing is pending.     09/23/2022 Imaging   PET scan showed 1. 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 2. Enlarged cirrhotic liver. 3. Cholelithiasis. 4. Bilateral renal stones. 5. Umbilical hernias contain unobstructed small bowel and/or fat.  6. Aortic atherosclerosis (ICD10-I70.0). Coronary artery calcification. 7. Enlarged pulmonic trunk, indicative of pulmonary arterial hypertension.    10/02/2022 - 04/17/2023 Chemotherapy   Patient is on Treatment Plan : GASTROESOPHAGEAL FOLFOX q14d     12/31/2022 Imaging   PET showed Significant interval response to therapy.   Residual masslike thickening at the gastric cardia, corresponding to the patient's known primary gastric carcinoma, with decreased hypermetabolic activity.   Near complete resolution of prior hypermetabolic thoracic and upper abdominal lymphadenopathy. Residual AP window and perigastric nodes.   Peritoneal disease is no longer evident.   04/10/2023 Imaging   CT chest abdomen pelvis wo contrast showed Mild residual wall thickening involving the posterior gastric cardia, likely corresponding to the patient's known gastric adenocarcinoma.   Mediastinal, perigastric, and retroperitoneal nodal metastases,overall grossly unchanged, although possibly mildly progressive in the chest.   Moderate left and small to moderate right pleural effusions, new.Small pericardial effusion,  new. Trace pelvic ascites, new.   Additional stable ancillary findings as above.  Aortic Atherosclerosis (ICD10-I70.0) and Emphysema (ICD10-J43.9)     ALLERGIES:  is allergic to pollen extract and sulfa antibiotics.  MEDICATIONS:   Current Facility-Administered Medications  Medication Dose Route Frequency Provider Last Rate Last Admin   albumin human 25 % solution 12.5 g  12.5 g Intravenous Daily Vida Rigger, MD 60 mL/hr at 05/05/23 0928 12.5 g at 05/05/23 0928   apixaban (ELIQUIS) tablet 5 mg  5 mg Oral BID Bettey Costa, RPH   5 mg at 05/04/23 2209   atorvastatin (LIPITOR) tablet 40 mg  40 mg Oral q1800 Kathrynn Running, MD   40 mg at 05/04/23 1723   busPIRone (BUSPAR) tablet 7.5 mg  7.5 mg Oral BID Kathrynn Running, MD   7.5 mg at 05/04/23 2209   Chlorhexidine Gluconate Cloth 2 % PADS 6 each  6 each Topical Daily Kathrynn Running, MD   6 each at 05/05/23 1610   guaiFENesin-dextromethorphan (ROBITUSSIN DM) 100-10 MG/5ML syrup 10 mL  10 mL Oral Q4H PRN Enedina Finner, MD       lip balm (BLISTEX) ointment   Topical PRN Hunt, Madison H, RPH       loratadine (CLARITIN) tablet 10 mg  10 mg Oral Daily PRN Enedina Finner, MD       metoprolol tartrate (LOPRESSOR) tablet 12.5 mg  12.5 mg Oral BID Kathrynn Running, MD   12.5 mg at 05/04/23 2210   midodrine (PROAMATINE) tablet 10 mg  10 mg Oral TID PRN Enedina Finner, MD       Muscle Rub CREA 1 Application  1 Application Topical PRN Enedina Finner, MD       ondansetron [facility]) injection 4 mg  4 mg Intravenous Q6H PRN Enedina Finner, MD   4 mg at 05/05/23 0524   ondansetron (ZOFRAN-ODT) disintegrating tablet 4 mg  4 mg Oral Q8H PRN Kathrynn Running, MD   4 mg at 05/03/23 2048   oxyCODONE (Oxy IR/ROXICODONE) immediate release tablet 5 mg  5 mg Oral Q6H PRN Kathrynn Running, MD   5 mg at 05/03/23 2048   polyvinyl alcohol (LIQUIFILM TEARS) 1.4 % ophthalmic solution 1 drop  1 drop Both Eyes PRN Enedina Finner, MD       sertraline (ZOLOFT) tablet 75 mg  75 mg Oral Daily Kathrynn Running, MD   75 mg at 05/04/23 0930   sodium chloride flush (NS) 0.9 % injection 10-40 mL  10-40 mL Intracatheter Q12H Wouk, Wilfred Curtis, MD   10 mL at 05/05/23 0845   sodium zirconium cyclosilicate  (LOKELMA) packet 10 g  10 g Oral BID Enedina Finner, MD        VITAL SIGNS: BP (!) 142/95 (BP Location: Left Arm)   Pulse 77   Temp (!) 97.5 F (36.4 C) (Oral)   Resp 19   Ht 5\' 11"  (1.803 m)   Wt 233 lb 11 oz (106 kg)   SpO2 97%   BMI 32.59 kg/m  Filed Weights   04/29/23 1659  Weight: 233 lb 11 oz (106 kg)    Estimated body mass index is 32.59 kg/m as calculated from the following:   Height as of this encounter: 5\' 11"  (1.803 m).   Weight as of this encounter: 233 lb 11 oz (106 kg).  LABS: CBC:    Component Value Date/Time   WBC 7.0 05/02/2023 1054   HGB 10.6 (L) 05/02/2023 1054  HGB 11.9 (L) 04/15/2023 0848   HGB 11.6 (L) 05/23/2022 1606   HCT 33.2 (L) 05/02/2023 1054   HCT 35.8 (L) 05/23/2022 1606   PLT 228 05/02/2023 1054   PLT 202 04/15/2023 0848   PLT 421 05/23/2022 1606   MCV 89.2 05/02/2023 1054   MCV 84 05/23/2022 1606   NEUTROABS 5.7 04/15/2023 0848   NEUTROABS 5.8 05/23/2022 1606   LYMPHSABS 0.8 04/15/2023 0848   LYMPHSABS 2.6 05/23/2022 1606   MONOABS 1.0 04/15/2023 0848   EOSABS 0.4 04/15/2023 0848   EOSABS 0.2 05/23/2022 1606   BASOSABS 0.1 04/15/2023 0848   BASOSABS 0.1 05/23/2022 1606   Comprehensive Metabolic Panel:    Component Value Date/Time   NA 135 05/05/2023 0439   NA 139 05/23/2022 1606   K 6.1 (H) 05/05/2023 0439   CL 102 05/05/2023 0439   CO2 16 (L) 05/05/2023 0439   BUN 102 (H) 05/05/2023 0439   BUN 29 (H) 05/23/2022 1606   CREATININE 6.45 (H) 05/05/2023 0439   CREATININE 2.21 (H) 04/15/2023 0848   GLUCOSE 184 (H) 05/05/2023 0439   CALCIUM 9.0 05/05/2023 0439   AST 30 05/01/2023 0326   AST 18 04/15/2023 0848   ALT 22 05/01/2023 0326   ALT 11 04/15/2023 0848   ALKPHOS 143 (H) 05/01/2023 0326   BILITOT 0.9 05/01/2023 0326   BILITOT 1.0 04/15/2023 0848   PROT 7.3 05/01/2023 0326   PROT 7.1 05/01/2023 0326   PROT 6.9 05/23/2022 1606   ALBUMIN 3.0 (L) 05/01/2023 0326   ALBUMIN 3.9 05/23/2022 1606    RADIOGRAPHIC  STUDIES: ECHOCARDIOGRAM LIMITED Result Date: 05/05/2023    ECHOCARDIOGRAM LIMITED REPORT   Patient Name:   DARRAGH NAY Date of Exam: 05/05/2023 Medical Rec #:  [id number]       Height:       71.0 in Accession #:    [id number]      Weight:       233.7 lb Date of Birth:  01-23-1971       BSA:          2.253 m Patient Age:    52 years        BP:           129/96 mmHg Patient Gender: M               HR:           79 bpm. Exam Location:  [facility] Procedure: Limited Echo, Cardiac Doppler and Color Doppler Indications:     Pericardial Effusion I31.3  History:         Patient has prior history of Echocardiogram examinations, most                  recent 05/01/2023. Stroke; Risk Factors:Diabetes. Non-ischemic                  cardiomyopathy.  Sonographer:     Cristela Blue Referring Phys:  [id number] CADENCE H FURTH Diagnosing Phys: Lorine Bears MD IMPRESSIONS  1. Left ventricular ejection fraction, by estimation, is 50 to 55%. The left ventricle has low normal function. The left ventricle has no regional wall motion abnormalities. There is moderate left ventricular hypertrophy. Left ventricular diastolic parameters are indeterminate.  2. Right ventricular systolic function is normal. The right ventricular size is normal. There is normal pulmonary artery systolic pressure.  3. Left atrial size was mildly dilated.  4. Large pericardial effusion. The pericardial effusion is circumferential. No RV or  RA collapse but there is early tamponade my mitral inflow.  5. The mitral valve is normal in structure. No evidence of mitral valve regurgitation. No evidence of mitral stenosis.  6. The aortic valve is normal in structure. Aortic valve regurgitation is not visualized. Aortic valve sclerosis is present, with no evidence of aortic valve stenosis.  7. The inferior vena cava is dilated in size with <50% respiratory variability, suggesting right atrial pressure of 15 mmHg. FINDINGS  Left Ventricle: Left ventricular ejection fraction, by  estimation, is 50 to 55%. The left ventricle has low normal function. The left ventricle has no regional wall motion abnormalities. The left ventricular internal cavity size was normal in size. There is moderate left ventricular hypertrophy. Left ventricular diastolic parameters are indeterminate. Right Ventricle: The right ventricular size is normal. No increase in right ventricular wall thickness. Right ventricular systolic function is normal. There is normal pulmonary artery systolic pressure. The tricuspid regurgitant velocity is 1.97 m/s, and  with an assumed right atrial pressure of 15 mmHg, the estimated right ventricular systolic pressure is 30.5 mmHg. Left Atrium: Left atrial size was mildly dilated. Right Atrium: Right atrial size was normal in size. Pericardium: A large pericardial effusion is present. The pericardial effusion is circumferential. Mitral Valve: The mitral valve is normal in structure. No evidence of mitral valve stenosis. Tricuspid Valve: The tricuspid valve is normal in structure. Tricuspid valve regurgitation is trivial. No evidence of tricuspid stenosis. Aortic Valve: The aortic valve is normal in structure. Aortic valve regurgitation is not visualized. Aortic valve sclerosis is present, with no evidence of aortic valve stenosis. Pulmonic Valve: The pulmonic valve was normal in structure. Pulmonic valve regurgitation is trivial. No evidence of pulmonic stenosis. Aorta: The aortic root is normal in size and structure. Venous: The inferior vena cava is dilated in size with less than 50% respiratory variability, suggesting right atrial pressure of 15 mmHg. IAS/Shunts: No atrial level shunt detected by color flow Doppler. Additional Comments: Spectral Doppler performed. Color Doppler performed.  LEFT VENTRICLE PLAX 2D LVIDd:         3.90 cm LVIDs:         2.70 cm LV PW:         1.90 cm LV IVS:        1.60 cm  RIGHT VENTRICLE RV Basal diam:  3.60 cm RV Mid diam:    2.90 cm LEFT ATRIUM              Index        RIGHT ATRIUM           Index LA Vol (A2C):   73.9 ml 32.80 ml/m  RA Area:     17.10 cm LA Vol (A4C):   59.1 ml 26.23 ml/m  RA Volume:   45.50 ml  20.20 ml/m LA Biplane Vol: 69.1 ml 30.67 ml/m  MITRAL VALVE               TRICUSPID VALVE MV Area (PHT): 5.50 cm    TR Peak grad:   15.5 mmHg MV Decel Time: 138 msec    TR Vmax:        197.00 cm/s MV E velocity: 36.20 cm/s MV A velocity: 64.50 cm/s MV E/A ratio:  0.56 Lorine Bears MD Electronically signed by Lorine Bears MD Signature Date/Time: 05/05/2023/11:53:48 AM    Final    DG Chest Port 1 View Result Date: 05/04/2023 CLINICAL DATA:  Pleural effusion, stage IV gastric cancer. EXAM: PORTABLE CHEST  1 VIEW COMPARISON:  04/30/2023 is CT chest 04/30/2023. FINDINGS: Right IJ power port tip is in the low SVC or at the SVC RA junction. Heart is enlarged, stable. Diffuse mixed interstitial and airspace opacification with bilateral pleural effusions. Findings are similar to 04/30/2023. IMPRESSION: Congestive heart failure. Electronically Signed   By: Leanna Battles M.D.   On: 05/04/2023 17:43   DG Abd 1 View Result Date: 05/04/2023 CLINICAL DATA:  Hydronephrosis, stage IV gastric cancer EXAM: ABDOMEN - 1 VIEW COMPARISON:  CT scan 05/01/2023 FINDINGS: A central line terminates in the right atrium. Blunting of both costophrenic angles compatible with bilateral pleural effusions. Thoracolumbar spondylosis. Mild to moderate enlargement of the cardiopericardial silhouette; the 05/01/2023 CT scan showed a pericardial effusion. Formed stool noted in the colon. Dilute retained contrast in the left renal collecting system which appears moderately dilated. IMPRESSION: 1. Dilute retained contrast in the left renal collecting system which appears moderately dilated. 2. Mild to moderate enlargement of the cardiopericardial silhouette, compatible with pericardial effusion. 3. Bilateral pleural effusions. 4. Formed stool in the colon. Electronically Signed   By:  Gaylyn Rong M.D.   On: 05/04/2023 17:43   US RENAL Result Date: 05/04/2023 CLINICAL DATA:  Hydronephrosis. EXAM: RENAL / URINARY TRACT ULTRASOUND COMPLETE COMPARISON:  May 01, 2023. FINDINGS: Right Kidney: Renal measurements: 9.2 x 5.9 x 5.1 cm = volume: 145 mL. Echogenicity within normal limits. No mass or hydronephrosis visualized. Left Kidney: Renal measurements: 10.8 x 5.9 x 4.9 cm = volume: 163 mL. Echogenicity within normal limits. No mass visualized. Moderate hydronephrosis is noted. Two calculi are noted, each measuring approximately 7 mm. Bladder: Not visualized. Other: None. IMPRESSION: Moderate left hydronephrosis. Nonobstructive left nephrolithiasis. Normal right kidney. Electronically Signed   By: Lupita Raider M.D.   On: 05/04/2023 17:40   CT ABDOMEN PELVIS WO CONTRAST Result Date: 05/01/2023 CLINICAL DATA:  History of gastric adenocarcinoma with nausea and vomiting. * Tracking Code: BO * EXAM: CT ABDOMEN AND PELVIS WITHOUT CONTRAST TECHNIQUE: Multidetector CT imaging of the abdomen and pelvis was performed following the standard protocol without IV contrast. RADIATION DOSE REDUCTION: This exam was performed according to the departmental dose-optimization program which includes automated exposure control, adjustment of the mA and/or kV according to patient size and/or use of iterative reconstruction technique. COMPARISON:  CT abdomen and pelvis dated 04/10/2023, CTA chest dated 04/30/2023, nuclear medicine PET dated 12/31/2022 FINDINGS: Lower chest: Partially imaged central venous catheter terminates at the superior cavoatrial junction. Re-expansion of partially imaged lingula and left lower lobe status post thoracentesis. Diffuse patchy ground-glass opacities of the lingula and partially imaged left lower lobe. Similar large right and decreased moderate left pleural effusions. No pneumothorax. Similar pericardial effusion. Coronary artery calcifications. Hepatobiliary: No focal  hepatic lesions. No intra or extrahepatic biliary ductal dilation. Contracted gallbladder demonstrates cholelithiasis. Adrenals Pancreas: No focal lesions or main ductal dilation. Spleen: Normal in size without focal abnormality. Adrenals/Urinary Tract: Unchanged 1.3 cm left adrenal nodule measures 13 HU, not previously FDG-avid, likely adenoma. No specific follow-up imaging recommended. No right adrenal nodule. Mild left hydronephrosis contains excreted renal contrast material from recent CTA chest. Contrast is seen to the level of the proximal ureter, which demonstrates mild circumferential mural thickening with adjacent soft tissue irregularity (2:52), inseparable from the anterior psoas. Excreted contrast material within the urinary bladder. Stomach/Bowel: Decreased conspicuity of mural thickening involving the gastric cardia. No evidence of bowel wall thickening, distention, or inflammatory changes. Normal appendix, which is contained within the hernia.  Vascular/Lymphatic: Aortic atherosclerosis. Partially imaged paraesophageal lymph nodes measuring up to 9 mm (2:2) are unchanged. Continued decrease in size of perigastric node measuring 2.2 x 2.0 cm (2:34), previously 3.7 x 2.1 cm. Interval increased multi station periportal, retroaortic, and pelvic lymphadenopathy, for example -14 mm portacaval (2:33), previously 13 mm -10 mm left para-aortic (2:39), previously 5 mm -14 mm right external iliac (2:74), previously 12 mm Reproductive: Prostate is unremarkable. Other: Trace ascites.  No free air or fluid collection. Musculoskeletal: No acute or abnormal lytic or blastic osseous lesions. Multilevel degenerative changes of the partially imaged thoracic and lumbar spine. Large ventral midline lower abdominal wall hernia containing nonobstructed loops of small and large bowel, as before. IMPRESSION: 1. Mild left hydronephrosis contains excreted renal contrast material from recent CTA chest, suggestive of delayed  nephrogram. Contrast is seen to the level of the proximal ureter, which demonstrates mild circumferential mural thickening with adjacent soft tissue irregularity, inseparable from the anterior psoas. Findings are suspicious for ureteral involvement by metastatic disease, less likely ascending urinary tract infection. Recommend correlation with urinalysis. 2. Interval increased multi station periportal, retroaortic, and pelvic lymphadenopathy, indeterminate, however suspicious for metastatic disease. 3. Decreased conspicuity of mural thickening involving the gastric cardia. Decreased size of perigastric nodule. 4. Re-expansion of partially imaged lingula and left lower lobe status post thoracentesis. Diffuse patchy ground-glass opacities of the lingula and partially imaged left lower lobe, which may represent re-expansion pulmonary edema. 5. Similar large right and decreased moderate left pleural effusions. 6. Similar pericardial effusion. 7. Aortic Atherosclerosis (ICD10-I70.0). Coronary artery calcifications. Assessment for potential risk factor modification, dietary therapy or pharmacologic therapy may be warranted, if clinically indicated. Electronically Signed   By: Agustin Cree M.D.   On: 05/01/2023 15:51   ECHOCARDIOGRAM LIMITED Result Date: 05/01/2023    ECHOCARDIOGRAM LIMITED REPORT   Patient Name:   Jeremiah Vance Date of Exam: 04/30/2023 Medical Rec #:  [id number]       Height:       71.0 in Accession #:    [id number]      Weight:       233.7 lb Date of Birth:  1970/09/29       BSA:          2.253 m Patient Age:    52 years        BP:           127/89 mmHg Patient Gender: M               HR:           95 bpm. Exam Location:  [facility] Procedure: 2D Echo and Limited Echo Indications:     I31.3 Pericardial Effusion  History:         Patient has prior history of Echocardiogram examinations, most                  recent 04/24/2023. Cardiomyopathy, Stroke, Arrythmias:Atrial                  Fibrillation; Risk  Factors:Hypertension and Diabetes.  Sonographer:     Sedonia Small Rodgers-Jones RDCS Referring Phys:  [id number] CHRISTOPHER RONALD BERGE Diagnosing Phys: Julien Nordmann MD IMPRESSIONS  1. Left ventricular ejection fraction, by estimation, is 40 to 45%. The left ventricle has mildly decreased function. The left ventricle demonstrates global hypokinesis. There is moderate left ventricular hypertrophy. Left ventricular diastolic parameters are indeterminate.  2. Right ventricular systolic function is mildly reduced. The right ventricular size is  normal. Tricuspid regurgitation signal is inadequate for assessing PA pressure.  3. Moderate pericardial effusion. There is no evidence of cardiac tamponade.  4. The mitral valve is normal in structure. No evidence of mitral valve regurgitation. No evidence of mitral stenosis.  5. The aortic valve is normal in structure. Aortic valve regurgitation is not visualized. No aortic stenosis is present.  6. The inferior vena cava is dilated in size with >50% respiratory variability, suggesting right atrial pressure of 8 mmHg. FINDINGS  Left Ventricle: Left ventricular ejection fraction, by estimation, is 40 to 45%. The left ventricle has mildly decreased function. The left ventricle demonstrates global hypokinesis. The left ventricular internal cavity size was normal in size. There is  moderate left ventricular hypertrophy. Left ventricular diastolic parameters are indeterminate. Right Ventricle: The right ventricular size is normal. No increase in right ventricular wall thickness. Right ventricular systolic function is mildly reduced. Tricuspid regurgitation signal is inadequate for assessing PA pressure. Left Atrium: Left atrial size was normal in size. Right Atrium: Right atrial size was normal in size. Pericardium: A moderately sized pericardial effusion is present. There is no evidence of cardiac tamponade. Mitral Valve: The mitral valve is normal in structure. No evidence of mitral valve  stenosis. Tricuspid Valve: The tricuspid valve is normal in structure. Tricuspid valve regurgitation is not demonstrated. No evidence of tricuspid stenosis. Aortic Valve: The aortic valve is normal in structure. Aortic valve regurgitation is not visualized. No aortic stenosis is present. Pulmonic Valve: The pulmonic valve was normal in structure. Pulmonic valve regurgitation is not visualized. No evidence of pulmonic stenosis. Aorta: The aortic root is normal in size and structure. Venous: The inferior vena cava is dilated in size with greater than 50% respiratory variability, suggesting right atrial pressure of 8 mmHg. IAS/Shunts: No atrial level shunt detected by color flow Doppler. LEFT VENTRICLE PLAX 2D LVIDd:         4.90 cm LVIDs:         4.10 cm LV PW:         1.30 cm LV IVS:        1.20 cm  IVC IVC diam: 2.50 cm LEFT ATRIUM         Index LA diam:    5.20 cm 2.31 cm/m   AORTA Ao Root diam: 3.80 cm Julien Nordmann MD Electronically signed by Julien Nordmann MD Signature Date/Time: 05/01/2023/11:13:46 AM    Final    DG Chest Port 1 View Result Date: 04/30/2023 CLINICAL DATA:  Status post thoracentesis EXAM: PORTABLE CHEST 1 VIEW COMPARISON:  CT chest dated April 30, 2023. Chest radiograph dated April 29, 2023. FINDINGS: Status post thoracentesis with moderate-to-large left pleural effusion, decreased in size compared to the prior exam. There is improved aeration of the left upper lung. Persistent left basilar atelectasis. Small to moderate-sized layering right pleural effusion with associated basilar atelectasis. No pneumothorax. Central pulmonary vascular congestion. Similar enlargement of the cardiac silhouette. Stable right chest Port-A-Cath in place. Visualized osseous structures are unchanged. IMPRESSION: 1. Status post thoracentesis with decreased size of a moderate-to-large left pleural effusion and improved aeration within the left upper lung. Left basilar atelectasis. 2. Small to moderate-sized  layering right pleural effusion with associated basilar atelectasis. 3. Central pulmonary vascular congestion. Electronically Signed   By: Hart Robinsons M.D.   On: 04/30/2023 12:10   US THORACENTESIS ASP PLEURAL SPACE W/IMG GUIDE Result Date: 04/30/2023 INDICATION: Patient with history of CHF, CKD3, cirrhosis, stage 4 gastric adenocarcinoma admitted for chest pain  and dyspnea. Imaging shows new bilateral pleural effusions. Request for diagnostic and therapeutic thoracentesis. EXAM: ULTRASOUND GUIDED LEFT THORACENTESIS MEDICATIONS: 7 mL 1% lidocaine COMPLICATIONS: None immediate. PROCEDURE: An ultrasound guided thoracentesis was thoroughly discussed with the patient and questions answered. The benefits, risks, alternatives and complications were also discussed. The patient understands and wishes to proceed with the procedure. Written consent was obtained. Ultrasound was performed to localize and mark an adequate pocket of fluid in the left chest. The area was then prepped and draped in the normal sterile fashion. 1% Lidocaine was used for local anesthesia. Under ultrasound guidance a 6 Fr Safe-T-Centesis catheter was introduced. Thoracentesis was performed. The catheter was removed and a dressing applied. FINDINGS: A total of approximately 1.5 L of clear yellow fluid was removed. Samples were sent to the laboratory as requested by the clinical team. IMPRESSION: Successful ultrasound guided left thoracentesis yielding 1.5 L of pleural fluid. Performed by Lynnette Caffey, PA-C Electronically Signed   By: Malachy Moan M.D.   On: 04/30/2023 12:04   CT Angio Chest PE W and/or Wo Contrast Result Date: 04/30/2023 CLINICAL DATA:  53 year old male with history of shortness of breath and hypoxia. Left-sided chest pain. History of gastric cancer. * Tracking Code: BO * EXAM: CT ANGIOGRAPHY CHEST WITH CONTRAST TECHNIQUE: Multidetector CT imaging of the chest was performed using the standard protocol during bolus  administration of intravenous contrast. Multiplanar CT image reconstructions and MIPs were obtained to evaluate the vascular anatomy. RADIATION DOSE REDUCTION: This exam was performed according to the departmental dose-optimization program which includes automated exposure control, adjustment of the mA and/or kV according to patient size and/or use of iterative reconstruction technique. CONTRAST:  50mL OMNIPAQUE IOHEXOL 350 MG/ML SOLN COMPARISON:  CT of the chest, abdomen and pelvis 04/10/2023. FINDINGS: Cardiovascular: Study is slightly limited by suboptimal contrast bolus. With these limitations in mind, there is no central, lobar or segmental sized filling defect in the pulmonary arterial tree to indicate clinically relevant pulmonary embolism. Smaller subsegmental sized pulmonary emboli can not be entirely excluded. Heart size is normal. Moderate to large pericardial effusion. No definite pericardial calcification. There is aortic atherosclerosis, as well as atherosclerosis of the great vessels of the mediastinum and the coronary arteries, including calcified atherosclerotic plaque in the left anterior descending coronary artery. Mild calcifications of the aortic valve and mitral annulus. Right internal jugular single-lumen porta cath with tip terminating at the superior cavoatrial junction. Mediastinum/Nodes: No pathologically enlarged mediastinal or hilar lymph nodes. Esophagus is unremarkable in appearance. No axillary lymphadenopathy. Lungs/Pleura: Large bilateral pleural effusions (left-greater-than-right), predominantly lying dependently. Associated with these is extensive passive atelectasis in the lungs bilaterally. No definite consolidative airspace disease. Mild diffuse ground-glass attenuation and mild interlobular septal thickening in the lungs suggesting a background of mild interstitial pulmonary edema. Several small scattered pulmonary nodules are noted measuring 5 mm or less in size, similar to  several prior examinations dating back to 2023, favored to be benign. No larger more suspicious appearing pulmonary nodules or masses are noted. Upper Abdomen: Aortic atherosclerosis. Musculoskeletal: There are no aggressive appearing lytic or blastic lesions noted in the visualized portions of the skeleton. Review of the MIP images confirms the above findings. IMPRESSION: 1. No central, lobar or segmental sized pulmonary embolism. 2. Moderate to large pericardial effusion. 3. Large bilateral pleural effusions (left-greater-than-right) with extensive passive atelectasis in the lungs bilaterally. 4. Mild interstitial pulmonary edema. 5. Aortic atherosclerosis, in addition to left anterior descending coronary artery disease. 6. There are calcifications  of the aortic valve and mitral annulus. Echocardiographic correlation for evaluation of potential valvular dysfunction may be warranted if clinically indicated. 7. Small pulmonary nodules measuring 5 mm or less in size, stable compared to prior examinations, favored to be benign (although continued attention on future follow-up imaging is recommended given the patient's history of gastric cancer). Aortic Atherosclerosis (ICD10-I70.0). Electronically Signed   By: Trudie Reed M.D.   On: 04/30/2023 07:24   DG Chest 2 View Result Date: 04/29/2023 CLINICAL DATA:  Chest pain EXAM: CHEST - 2 VIEW COMPARISON:  04/23/2023 FINDINGS: Persistent consolidation/atelectasis at the left lung base with probable adjacent effusion. Some increase in perihilar pulmonary vascular congestion suspected. Mild thickening of or fluid in the interlobar fissures as before. No pneumothorax. Stable right IJ port catheter. Heart size and mediastinal contours are within normal limits. Visualized bones unremarkable. IMPRESSION: 1. Persistent left basilar consolidation/atelectasis with probable adjacent effusion. 2. Increased perihilar pulmonary vascular congestion. Electronically Signed   By: Corlis Leak M.D.   On: 04/29/2023 17:24   ECHOCARDIOGRAM COMPLETE Result Date: 04/24/2023    ECHOCARDIOGRAM REPORT   Patient Name:   Jeremiah Vance Date of Exam: 04/24/2023 Medical Rec #:  [id number]       Height:       71.0 in Accession #:    [id number]      Weight:       245.0 lb Date of Birth:  [birth year]       BSA:          2.298 m Patient Age:    52 years        BP:           157/91 mmHg Patient Gender: M               HR:           90 bpm. Exam Location:  [facility] Procedure: 2D Echo, Cardiac Doppler and Color Doppler Indications:     CHF  History:         Patient has prior history of Echocardiogram examinations, most                  recent 11/22/2022. CHF, Stroke, Arrythmias:Atrial Fibrillation,                  Signs/Symptoms:Fatigue and Shortness of Breath; Risk                  Factors:Hypertension, Diabetes, Dyslipidemia and Sleep Apnea.                  CKD.  Sonographer:     Mikki Harbor Referring Phys:  [id number] Tresa Endo A GRIFFITH Diagnosing Phys: Julien Nordmann MD  Sonographer Comments: Patient is obese. IMPRESSIONS  1. Left ventricular ejection fraction, by estimation, is 40 to 45%. The left ventricle has mildly decreased function. The left ventricle demonstrates global hypokinesis. There is moderate left ventricular hypertrophy. Left ventricular diastolic parameters are indeterminate.  2. Right ventricular systolic function is mildly reduced. The right ventricular size is normal.  3. Left atrial size was moderately dilated.  4. A small pericardial effusion is present.  5. The mitral valve is normal in structure. Mild mitral valve regurgitation. No evidence of mitral stenosis.  6. The aortic valve is normal in structure. Aortic valve regurgitation is not visualized. No aortic stenosis is present.  7. There is mild dilatation of the aortic root, measuring 41 mm. There is mild dilatation of the ascending aorta, measuring  42 mm.  8. The inferior vena cava is normal in size with greater than 50% respiratory  variability, suggesting right atrial pressure of 3 mmHg. FINDINGS  Left Ventricle: Left ventricular ejection fraction, by estimation, is 40 to 45%. The left ventricle has mildly decreased function. The left ventricle demonstrates global hypokinesis. The left ventricular internal cavity size was normal in size. There is  moderate left ventricular hypertrophy. Left ventricular diastolic parameters are indeterminate. Right Ventricle: The right ventricular size is normal. No increase in right ventricular wall thickness. Right ventricular systolic function is mildly reduced. Left Atrium: Left atrial size was moderately dilated. Right Atrium: Right atrial size was normal in size. Pericardium: A small pericardial effusion is present. Mitral Valve: The mitral valve is normal in structure. Mild mitral annular calcification. Mild mitral valve regurgitation. No evidence of mitral valve stenosis. MV peak gradient, 4.4 mmHg. The mean mitral valve gradient is 2.0 mmHg. Tricuspid Valve: The tricuspid valve is normal in structure. Tricuspid valve regurgitation is not demonstrated. No evidence of tricuspid stenosis. Aortic Valve: The aortic valve is normal in structure. Aortic valve regurgitation is not visualized. No aortic stenosis is present. Aortic valve mean gradient measures 2.7 mmHg. Aortic valve peak gradient measures 5.0 mmHg. Aortic valve area, by VTI measures 3.00 cm. Pulmonic Valve: The pulmonic valve was normal in structure. Pulmonic valve regurgitation is not visualized. No evidence of pulmonic stenosis. Aorta: The aortic root is normal in size and structure. There is mild dilatation of the aortic root, measuring 41 mm. There is mild dilatation of the ascending aorta, measuring 42 mm. Venous: The inferior vena cava is normal in size with greater than 50% respiratory variability, suggesting right atrial pressure of 3 mmHg. IAS/Shunts: No atrial level shunt detected by color flow Doppler.  LEFT VENTRICLE PLAX 2D LVIDd:          5.40 cm LVIDs:         2.70 cm LV PW:         1.50 cm LV IVS:        1.60 cm LVOT diam:     2.20 cm LV SV:         60 LV SV Index:   26 LVOT Area:     3.80 cm  RIGHT VENTRICLE RV Basal diam:  3.80 cm RV Mid diam:    3.70 cm LEFT ATRIUM             Index        RIGHT ATRIUM           Index LA diam:        4.90 cm 2.13 cm/m   RA Area:     20.10 cm LA Vol (A2C):   73.9 ml 32.15 ml/m  RA Volume:   57.10 ml  24.84 ml/m LA Vol (A4C):   97.4 ml 42.38 ml/m LA Biplane Vol: 87.3 ml 37.98 ml/m  AORTIC VALVE                    PULMONIC VALVE AV Area (Vmax):    3.17 cm     PV Vmax:       0.88 m/s AV Area (Vmean):   2.72 cm     PV Peak grad:  3.1 mmHg AV Area (VTI):     3.00 cm AV Vmax:           111.67 cm/s AV Vmean:          77.900 cm/s AV VTI:  0.200 m AV Peak Grad:      5.0 mmHg AV Mean Grad:      2.7 mmHg LVOT Vmax:         93.25 cm/s LVOT Vmean:        55.650 cm/s LVOT VTI:          0.158 m LVOT/AV VTI ratio: 0.79  AORTA Ao Root diam: 4.10 cm Ao Asc diam:  4.20 cm MITRAL VALVE MV Area (PHT): 3.65 cm    SHUNTS MV Area VTI:   2.63 cm    Systemic VTI:  0.16 m MV Peak grad:  4.4 mmHg    Systemic Diam: 2.20 cm MV Mean grad:  2.0 mmHg MV Vmax:       1.05 m/s MV Vmean:      58.7 cm/s MV Decel Time: 208 msec MV E velocity: 84.00 cm/s Julien Nordmann MD Electronically signed by Julien Nordmann MD Signature Date/Time: 04/24/2023/1:11:41 PM    Final    DG Chest 2 View Result Date: 04/23/2023 CLINICAL DATA:  Congestion, shortness of breath.  COPD EXAM: CHEST - 2 VIEW COMPARISON:  10/27/2022 FINDINGS: Right Port-A-Cath in place with the tip at the cavoatrial junction. Cardiomegaly with vascular congestion. Left pleural effusion with perihilar and lower lobe airspace opacities, left greater than right. No acute bony abnormality. IMPRESSION: Cardiomegaly with vascular congestion and bilateral perihilar opacities and lower lobe opacities, left greater than right. Favor asymmetric edema. Cannot exclude  pneumonia. Small to moderate left pleural effusion. Electronically Signed   By: Charlett Nose M.D.   On: 04/23/2023 17:40   CT CHEST ABDOMEN PELVIS WO CONTRAST Result Date: 04/15/2023 CLINICAL DATA:  Restaging gastric cancer EXAM: CT CHEST, ABDOMEN AND PELVIS WITHOUT CONTRAST TECHNIQUE: Multidetector CT imaging of the chest, abdomen and pelvis was performed following the standard protocol without IV contrast. RADIATION DOSE REDUCTION: This exam was performed according to the departmental dose-optimization program which includes automated exposure control, adjustment of the mA and/or kV according to patient size and/or use of iterative reconstruction technique. COMPARISON:  PET-CT dated 12/31/2022 FINDINGS: CT CHEST FINDINGS Cardiovascular: Mild cardiomegaly.  Small pericardial effusion, new. No evidence of thoracic aortic aneurysm. Atherosclerotic calcifications of the aortic arch. Mild coronary atherosclerosis of the LAD and right coronary artery. Right chest port terminates at the cavoatrial junction. Mediastinum/Nodes: Dominant 12 mm short axis AP window node, similar. However, additional mediastinal nodes are favored to be mildly progressive, including a 2 mm short axis low right paratracheal node (series 2/image 23) and new prevascular nodes measuring up to 7 mm short axis (series 2/image 21). Visualized thyroid is unremarkable. Lungs/Pleura: 5 mm triangular subpleural nodule in the right lower lobe (series 4/image 109), unchanged, likely benign. Additional subpleural nodularity in the right upper and right middle lobes, measuring up to 5 mm, benign. No new/suspicious pulmonary nodules. Mild centrilobular emphysematous changes, upper lung predominant. Moderate left and small to moderate right pleural effusions, new. Associated lingular and left lower lobe compressive atelectasis. No pneumothorax. Musculoskeletal: Degenerative changes of the lower thoracic spine. CT ABDOMEN PELVIS FINDINGS Hepatobiliary:  Unenhanced liver is notable for a mildly macronodular contour but no focal hepatic lesion. Cholelithiasis, without associated inflammatory changes. No intrahepatic or extrahepatic duct dilatation. Pancreas: Within normal limits. Spleen: Within normal limits. Adrenals/Urinary Tract: Adrenal glands are within normal limits. Kidneys are within normal limits. Renal vascular calcifications. Mild left hydronephrosis with narrowing at the UVJ. Thick-walled bladder, although underdistended. Stomach/Bowel: Mild residual wall thickening involving the posterior gastric cardia (series 2/image 85),  likely corresponding to the patient's known gastric adenocarcinoma. No evidence of bowel obstruction. Appendix is not discretely visualized. Sigmoid diverticulosis, without evidence of diverticulitis. Large complex midline lower abdominal ventral hernia containing multiple loops of nondilated bowel (series 2/image 106). Vascular/Lymphatic: No evidence of abdominal aortic aneurysm. Atherosclerotic calcifications of the abdominal aorta and branch vessels, although vessels remain patent. Perigastric soft tissue implant measuring 3.2 x 2.3 cm (series 2/image 53), previously 3.7 x 2.1 cm, favored to be mildly improved. Additional small periaortic/retroperitoneal nodes measuring up to 7 mm short axis (series 2/image 89), grossly unchanged. Reproductive: Prostate is unremarkable. Other: Trace pelvic ascites, new. Musculoskeletal: Degenerative changes of the lumbar spine. 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 Mediastinal, perigastric, and retroperitoneal nodal metastases, overall grossly unchanged, although possibly mildly progressive in the chest. Moderate left and small to moderate right pleural effusions, new. Small pericardial effusion, new. Trace pelvic ascites, new. Additional stable ancillary findings as above. Aortic  Atherosclerosis (ICD10-I70.0) and Emphysema (ICD10-J43.9). Electronically Signed   By: Charline Bills M.D.   On: 04/15/2023 00:24    PERFORMANCE STATUS (ECOG) : 3 - Symptomatic, >50% confined to bed  Review of Systems Unable to complete  Physical Exam General: frail, ill appearing Pulmonary: Unlabored Extremities: no edema, no joint deformities Skin: no rashes Neurological: Weakness, confused  IMPRESSION: Follow-up visit.  Hospitalization complicated now with multiorgan failure.  Patient with worsening renal function and hyperkalemia.  Renal ultrasound yesterday showed moderate left hydronephrosis.  Renal stenting has been discussed.  Patient has also been seen by nephrology and patient is felt to be a poor candidate for hemodialysis in light of his other comorbidities.  Today, patient confused.  He told me that he was in a hospital in [place].  Does not recall why he is in the hospital.  Patient likely encephalopathic due to renal failure.  He does not appear to have understanding necessary to make medical decisions in his present state.  I called and spoke with his sister who was at work.  She also spoke with the urology PA earlier today about possible stenting.  Sister tells me that she has not made a decision on how to proceed and wants to speak with other members of the family.  She says that she understands that patient is doing poorly and that this could reflect an end-of-life scenario.  We discussed the possible option of hospice and/or comfort care in the event that she decides not to pursue any further treatment.  Sister says that she will let us know what family decides.  PLAN: -Continue current scope of treatment -Family considering decision making   Time Total: 25 minutes  Visit consisted of counseling and education dealing with the complex and emotionally intense issues of symptom management and palliative care in the setting of serious and potentially  life-threatening illness.Greater than 50%  of this time was spent counseling and coordinating care related to the above assessment and plan.  Signed by: Laurette Schimke, PhD, NP-C

## 2023-05-05 NOTE — Progress Notes (Addendum)
Cardiology Progress Note   Patient Name: Jeremiah Vance Date of Encounter: 05/05/2023  Primary Cardiologist: Debbe Odea, MD  Subjective   Tired this AM.  Breathing somewhat labored, though he denies dyspnea.  No c/p. Objective   Inpatient Medications    Scheduled Meds:  apixaban  5 mg Oral BID   atorvastatin  40 mg Oral q1800   busPIRone  7.5 mg Oral BID   Chlorhexidine Gluconate Cloth  6 each Topical Daily   metoprolol tartrate  12.5 mg Oral BID   sertraline  75 mg Oral Daily   sodium chloride flush  10-40 mL Intracatheter Q12H   sodium zirconium cyclosilicate  10 g Oral BID   Continuous Infusions:  albumin human 12.5 g (05/05/23 0928)   PRN Meds: guaiFENesin-dextromethorphan, lip balm, loratadine, midodrine, Muscle Rub, ondansetron (ZOFRAN) IV, ondansetron, oxyCODONE, polyvinyl alcohol   Vital Signs    Vitals:   05/04/23 1722 05/04/23 1956 05/05/23 0334 05/05/23 0850  BP:  (!) 140/93 (!) 129/96 (!) 142/95  Pulse: 93 82 79 77  Resp:  18 20 19   Temp:  97.7 F (36.5 C) (!) 97.4 F (36.3 C) (!) 97.5 F (36.4 C)  TempSrc:    Oral  SpO2: 95% 99% 97%   Weight:      Height:        Intake/Output Summary (Last 24 hours) at 05/05/2023 1342 Last data filed at 05/05/2023 0841 Gross per 24 hour  Intake 100 ml  Output 0 ml  Net 100 ml   Filed Weights   04/29/23 1659  Weight: 106 kg    Physical Exam   GEN: Well nourished, well developed, in no acute distress.  HEENT: Grossly normal.  Neck: Supple, mod elev JVD, no carotid bruits, or masses. Cardiac: irregular, irreg, no murmurs, rubs, or gallops. No clubbing, cyanosis, trace to 1+ bilat LE edema.  Radials 2+, DP/PT 2+ and equal bilaterally.  Respiratory:  Respirations regular and unlabored, CTA. GI: Semi-firm, protuberant, nontender, BS + x 4. MS: no deformity or atrophy. Skin: warm and dry, no rash. Neuro:  Strength and sensation are intact. Psych: disoriented to place.  Flat affect.  Labs     Chemistry Recent Labs  Lab 05/01/23 0326 05/02/23 1054 05/03/23 0540 05/04/23 0337 05/05/23 0439  NA 135   < > 133* 133* 135  K 4.8   < > 4.5 5.4* 6.1*  CL 103   < > 99 100 102  CO2 20*   < > 17* 18* 16*  GLUCOSE 114*   < > 138* 135* 184*  BUN 62*   < > 87* 96* 102*  CREATININE 2.84*   < > 4.09* 5.09* 6.45*  CALCIUM 8.6*   < > 8.9 9.0 9.0  PROT 7.1  7.3  --   --   --   --   ALBUMIN 3.0*  --   --   --   --   AST 30  --   --   --   --   ALT 22  --   --   --   --   ALKPHOS 143*  --   --   --   --   BILITOT 0.9  --   --   --   --   GFRNONAA 26*   < > 17* 13* 10*  ANIONGAP 12   < > 17* 15 17*   < > = values in this interval not displayed.     Hematology Recent  Labs  Lab 04/29/23 1655 05/01/23 0326 05/02/23 1054  WBC 5.9 7.6 7.0  RBC 3.98* 3.88* 3.72*  HGB 11.5* 11.0* 10.6*  HCT 35.9* 35.3* 33.2*  MCV 90.2 91.0 89.2  MCH 28.9 28.4 28.5  MCHC 32.0 31.2 31.9  RDW 17.9* 17.8* 17.8*  PLT 182 188 228    Cardiac Enzymes  Recent Labs  Lab 04/29/23 1655 04/29/23 2254  TROPONINIHS 55* 53*      BNP    Component Value Date/Time   BNP 33.6 05/01/2023 0326    ProBNP    Component Value Date/Time   PROBNP 966 (H) 11/28/2021 1520    Lipids  Lab Results  Component Value Date   CHOL 120 11/22/2022   HDL 45 11/22/2022   LDLCALC 63 11/22/2022   TRIG 58 11/22/2022   CHOLHDL 2.7 11/22/2022    HbA1c  Lab Results  Component Value Date   HGBA1C 6.5 (A) 02/20/2023    Radiology    DG Chest Port 1 View Result Date: 05/04/2023 CLINICAL DATA:  Pleural effusion, stage IV gastric cancer. EXAM: PORTABLE CHEST 1 VIEW COMPARISON:  04/30/2023 is CT chest 04/30/2023. FINDINGS: Right IJ power port tip is in the low SVC or at the SVC RA junction. Heart is enlarged, stable. Diffuse mixed interstitial and airspace opacification with bilateral pleural effusions. Findings are similar to 04/30/2023. IMPRESSION: Congestive heart failure. Electronically Signed   By: Leanna Battles  M.D.   On: 05/04/2023 17:43   DG Abd 1 View Result Date: 05/04/2023 CLINICAL DATA:  Hydronephrosis, stage IV gastric cancer EXAM: ABDOMEN - 1 VIEW COMPARISON:  CT scan 05/01/2023 FINDINGS: A central line terminates in the right atrium. Blunting of both costophrenic angles compatible with bilateral pleural effusions. Thoracolumbar spondylosis. Mild to moderate enlargement of the cardiopericardial silhouette; the 05/01/2023 CT scan showed a pericardial effusion. Formed stool noted in the colon. Dilute retained contrast in the left renal collecting system which appears moderately dilated. IMPRESSION: 1. Dilute retained contrast in the left renal collecting system which appears moderately dilated. 2. Mild to moderate enlargement of the cardiopericardial silhouette, compatible with pericardial effusion. 3. Bilateral pleural effusions. 4. Formed stool in the colon. Electronically Signed   By: Gaylyn Rong M.D.   On: 05/04/2023 17:43   US RENAL Result Date: 05/04/2023 CLINICAL DATA:  Hydronephrosis. EXAM: RENAL / URINARY TRACT ULTRASOUND COMPLETE COMPARISON:  May 01, 2023. FINDINGS: Right Kidney: Renal measurements: 9.2 x 5.9 x 5.1 cm = volume: 145 mL. Echogenicity within normal limits. No mass or hydronephrosis visualized. Left Kidney: Renal measurements: 10.8 x 5.9 x 4.9 cm = volume: 163 mL. Echogenicity within normal limits. No mass visualized. Moderate hydronephrosis is noted. Two calculi are noted, each measuring approximately 7 mm. Bladder: Not visualized. Other: None. IMPRESSION: Moderate left hydronephrosis. Nonobstructive left nephrolithiasis. Normal right kidney. Electronically Signed   By: Lupita Raider M.D.   On: 05/04/2023 17:40   CT ABDOMEN PELVIS WO CONTRAST Result Date: 05/01/2023 CLINICAL DATA:  History of gastric adenocarcinoma with nausea and vomiting. * Tracking Code: BO * EXAM: CT ABDOMEN AND PELVIS WITHOUT CONTRAST TECHNIQUE: Multidetector CT imaging of the abdomen and pelvis was  performed following the standard protocol without IV contrast. RADIATION DOSE REDUCTION: This exam was performed according to the departmental dose-optimization program which includes automated exposure control, adjustment of the mA and/or kV according to patient size and/or use of iterative reconstruction technique. COMPARISON:  CT abdomen and pelvis dated 04/10/2023, CTA chest dated 04/30/2023, nuclear medicine PET dated 12/31/2022  FINDINGS: Lower chest: Partially imaged central venous catheter terminates at the superior cavoatrial junction. Re-expansion of partially imaged lingula and left lower lobe status post thoracentesis. Diffuse patchy ground-glass opacities of the lingula and partially imaged left lower lobe. Similar large right and decreased moderate left pleural effusions. No pneumothorax. Similar pericardial effusion. Coronary artery calcifications. Hepatobiliary: No focal hepatic lesions. No intra or extrahepatic biliary ductal dilation. Contracted gallbladder demonstrates cholelithiasis. Adrenals Pancreas: No focal lesions or main ductal dilation. Spleen: Normal in size without focal abnormality. Adrenals/Urinary Tract: Unchanged 1.3 cm left adrenal nodule measures 13 HU, not previously FDG-avid, likely adenoma. No specific follow-up imaging recommended. No right adrenal nodule. Mild left hydronephrosis contains excreted renal contrast material from recent CTA chest. Contrast is seen to the level of the proximal ureter, which demonstrates mild circumferential mural thickening with adjacent soft tissue irregularity (2:52), inseparable from the anterior psoas. Excreted contrast material within the urinary bladder. Stomach/Bowel: Decreased conspicuity of mural thickening involving the gastric cardia. No evidence of bowel wall thickening, distention, or inflammatory changes. Normal appendix, which is contained within the hernia. Vascular/Lymphatic: Aortic atherosclerosis. Partially imaged paraesophageal  lymph nodes measuring up to 9 mm (2:2) are unchanged. Continued decrease in size of perigastric node measuring 2.2 x 2.0 cm (2:34), previously 3.7 x 2.1 cm. Interval increased multi station periportal, retroaortic, and pelvic lymphadenopathy, for example -14 mm portacaval (2:33), previously 13 mm -10 mm left para-aortic (2:39), previously 5 mm -14 mm right external iliac (2:74), previously 12 mm Reproductive: Prostate is unremarkable. Other: Trace ascites.  No free air or fluid collection. Musculoskeletal: No acute or abnormal lytic or blastic osseous lesions. Multilevel degenerative changes of the partially imaged thoracic and lumbar spine. Large ventral midline lower abdominal wall hernia containing nonobstructed loops of small and large bowel, as before. IMPRESSION: 1. Mild left hydronephrosis contains excreted renal contrast material from recent CTA chest, suggestive of delayed nephrogram. Contrast is seen to the level of the proximal ureter, which demonstrates mild circumferential mural thickening with adjacent soft tissue irregularity, inseparable from the anterior psoas. Findings are suspicious for ureteral involvement by metastatic disease, less likely ascending urinary tract infection. Recommend correlation with urinalysis. 2. Interval increased multi station periportal, retroaortic, and pelvic lymphadenopathy, indeterminate, however suspicious for metastatic disease. 3. Decreased conspicuity of mural thickening involving the gastric cardia. Decreased size of perigastric nodule. 4. Re-expansion of partially imaged lingula and left lower lobe status post thoracentesis. Diffuse patchy ground-glass opacities of the lingula and partially imaged left lower lobe, which may represent re-expansion pulmonary edema. 5. Similar large right and decreased moderate left pleural effusions. 6. Similar pericardial effusion. 7. Aortic Atherosclerosis (ICD10-I70.0). Coronary artery calcifications. Assessment for potential  risk factor modification, dietary therapy or pharmacologic therapy may be warranted, if clinically indicated. Electronically Signed   By: Agustin Cree M.D.   On: 05/01/2023 15:51     Telemetry    Non-tele  Cardiac Studies   2D Echocardiogram   2D Echocardiogram 2.3.2025   1. Left ventricular ejection fraction, by estimation, is 50 to 55%. The  left ventricle has low normal function. The left ventricle has no regional  wall motion abnormalities. There is moderate left ventricular hypertrophy.  Left ventricular diastolic  parameters are indeterminate.   2. Right ventricular systolic function is normal. The right ventricular  size is normal. There is normal pulmonary artery systolic pressure.   3. Left atrial size was mildly dilated.   4. Large pericardial effusion. The pericardial effusion is  circumferential. No RV  or RA collapse but there is early tamponade my  mitral inflow.   5. The mitral valve is normal in structure. No evidence of mitral valve  regurgitation. No evidence of mitral stenosis.   6. The aortic valve is normal in structure. Aortic valve regurgitation is  not visualized. Aortic valve sclerosis is present, with no evidence of  aortic valve stenosis.   7. The inferior vena cava is dilated in size with <50% respiratory  variability, suggesting right atrial pressure of 15 mmHg.  _____________   Patient Profile     53 y.o. 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  Assessment & Plan    1.  Acute on chronic resp failure/Pleural effusion:  S/p thoracentesis earlier this admission.  Denies dyspnea, but inc wob w/ speech noted. Mild edema on exam. Echo this AM w/ large effusion and early tamponade.  Dr. Kirke Corin  to discuss pericardiocentesis.  2.  Large pericardial effusion: limited echo this AM w/ large effusion and early tamponade.  Dr. Kirke Corin to discuss pericardiocentesis.  Family considering palliative care.  3.  Acute on chronic renal failure:  creat higher this AM @ 6.45, BUN 102.  L hydronephrosis on renal u/s.  ? L ureteral stenting pending pts/families decisions around palliative care.  Poor candidate for HD per nephrology.  4.  HFrEF/NICM:  EF 40-45%.  Mild edema on exam w/ increasing dyspnea.  Diuretics on hold in setting of worsening renal fxn.  GDMT limited by hypotension, though pressures higher currently.  5.   Demand Iscemia: Mild hsTrop elevation in setting of above. (55  53).  No plan for ischemic eval at this time.  GDMT limited due to hypotension.  6.  Hyperkalemia:  K 6.1 this AM.  Mgmt per medicine/nephrology.  7.  Stage 4 gastric cancer:  Per onc/medicine.  8.  Permanent Afib:  hold eliquis in setting of renal failure.  9.  Encephalopathy:  in setting of above.  Family considering palliative care.  Signed, Nicolasa Ducking, NP  05/05/2023, 1:42 PM    For questions or updates, please contact   Please consult [email or web address] for contact info under Cardiology/STEMI.

## 2023-05-05 NOTE — Progress Notes (Signed)
*  PRELIMINARY RESULTS* Echocardiogram 2D Echocardiogram has been performed.  Cristela Blue 05/05/2023, 10:57 AM

## 2023-05-05 NOTE — Plan of Care (Signed)

## 2023-05-06 ENCOUNTER — Inpatient Hospital Stay: Payer: Medicaid Other

## 2023-05-06 ENCOUNTER — Encounter: Payer: Self-pay | Admitting: Oncology

## 2023-05-06 ENCOUNTER — Inpatient Hospital Stay: Payer: Medicaid Other | Admitting: Oncology

## 2023-05-06 DIAGNOSIS — N189 Chronic kidney disease, unspecified: Secondary | ICD-10-CM

## 2023-05-06 DIAGNOSIS — I10 Essential (primary) hypertension: Secondary | ICD-10-CM | POA: Diagnosis not present

## 2023-05-06 DIAGNOSIS — I482 Chronic atrial fibrillation, unspecified: Secondary | ICD-10-CM | POA: Diagnosis not present

## 2023-05-06 DIAGNOSIS — J90 Pleural effusion, not elsewhere classified: Secondary | ICD-10-CM | POA: Diagnosis not present

## 2023-05-06 DIAGNOSIS — N1832 Chronic kidney disease, stage 3b: Secondary | ICD-10-CM | POA: Diagnosis not present

## 2023-05-06 DIAGNOSIS — I3139 Other pericardial effusion (noninflammatory): Secondary | ICD-10-CM | POA: Diagnosis not present

## 2023-05-06 DIAGNOSIS — I5020 Unspecified systolic (congestive) heart failure: Secondary | ICD-10-CM | POA: Diagnosis not present

## 2023-05-06 DIAGNOSIS — C786 Secondary malignant neoplasm of retroperitoneum and peritoneum: Secondary | ICD-10-CM | POA: Diagnosis not present

## 2023-05-06 DIAGNOSIS — N179 Acute kidney failure, unspecified: Secondary | ICD-10-CM | POA: Diagnosis not present

## 2023-05-06 DIAGNOSIS — I639 Cerebral infarction, unspecified: Secondary | ICD-10-CM | POA: Diagnosis not present

## 2023-05-06 DIAGNOSIS — C168 Malignant neoplasm of overlapping sites of stomach: Secondary | ICD-10-CM | POA: Diagnosis not present

## 2023-05-06 DIAGNOSIS — C169 Malignant neoplasm of stomach, unspecified: Secondary | ICD-10-CM | POA: Diagnosis not present

## 2023-05-06 DIAGNOSIS — K7460 Unspecified cirrhosis of liver: Secondary | ICD-10-CM | POA: Diagnosis not present

## 2023-05-06 LAB — BASIC METABOLIC PANEL
Anion gap: 21 — ABNORMAL HIGH (ref 5–15)
BUN: 114 mg/dL — ABNORMAL HIGH (ref 6–20)
CO2: 14 mmol/L — ABNORMAL LOW (ref 22–32)
Calcium: 9 mg/dL (ref 8.9–10.3)
Chloride: 101 mmol/L (ref 98–111)
Creatinine, Ser: 8.26 mg/dL — ABNORMAL HIGH (ref 0.61–1.24)
GFR, Estimated: 7 mL/min — ABNORMAL LOW (ref >60)
Glucose, Bld: 162 mg/dL — ABNORMAL HIGH (ref 70–99)
Potassium: 6.5 mmol/L (ref 3.5–5.1)
Sodium: 136 mmol/L (ref 135–145)

## 2023-05-06 MED ORDER — MORPHINE SULFATE (CONCENTRATE) 10 MG /0.5 ML PO SOLN: 10.0000 mg | ORAL | 0 refills | Status: DC | PRN

## 2023-05-06 MED ORDER — LORAZEPAM 2 MG/ML IJ SOLN: 1.0000 mg | INTRAMUSCULAR | 0 refills | Status: DC | PRN

## 2023-05-06 MED ORDER — MORPHINE SULFATE (CONCENTRATE) 10 MG /0.5 ML PO SOLN: 10.0000 mg | ORAL | Status: DC | PRN

## 2023-05-06 MED ORDER — POLYVINYL ALCOHOL 1.4 % OP SOLN: 1.0000 [drp] | OPHTHALMIC | 0 refills | Status: DC | PRN

## 2023-05-06 MED ORDER — MORPHINE SULFATE (PF) 2 MG/ML IV SOLN: 1.0000 mg | INTRAVENOUS | 0 refills | Status: DC | PRN

## 2023-05-06 MED ORDER — SCOPOLAMINE 1 MG/3DAYS TD PT72: 1.0000 | MEDICATED_PATCH | TRANSDERMAL | 12 refills | Status: DC

## 2023-05-06 MED ORDER — MORPHINE SULFATE (PF) 2 MG/ML IV SOLN
1.0000 mg | INTRAVENOUS | Status: DC | PRN
  Administered 2023-05-06 (×2): 1 mg via INTRAVENOUS
  Filled 2023-05-06 (×2): qty 1

## 2023-05-06 MED ORDER — NEPRO/CARBSTEADY PO LIQD: 237.0000 mL | Freq: Three times a day (TID) | ORAL | Status: DC

## 2023-05-06 MED ORDER — LORAZEPAM 2 MG/ML IJ SOLN: 1.0000 mg | INTRAMUSCULAR | Status: DC | PRN

## 2023-05-06 MED ORDER — SCOPOLAMINE 1 MG/3DAYS TD PT72
1.0000 | MEDICATED_PATCH | TRANSDERMAL | Status: DC
  Administered 2023-05-06: 1.5 mg via TRANSDERMAL
  Filled 2023-05-06: qty 1

## 2023-05-06 NOTE — Progress Notes (Signed)
 [facility]  - [place] [facility]   PATIENT NAME: Jeremiah Vance    MR#:  [id number]  DATE OF BIRTH:  1970-08-17  SUBJECTIVE:  patient seen earlier. No family at bedside during my evaluation. Spoke earlier with patient's sister [name] on the phone and updated her  patient overall is lethargic not much responsive. Discussed hospice and comfort care with sister and she is in agreement.  Blood pressure (!) 125/98, pulse (!) 58, temperature 97.7 F (36.5 C), resp. rate 20, height 5' 11 (1.803 m), weight 106 kg, SpO2 97%.  PHYSICAL EXAMINATION:   GENERAL:  53 y.o.-year-old patient with no acute distress. Deconditioned chronically ill Blind left eye, lethargic LUNGS: decreased breath sounds bilaterally CARDIOVASCULAR: S1, S2 normal.  ABDOMEN: Soft, nontender, nondistended EXTREMITIES: ++ No  edema b/l.    NEUROLOGIC: very lethargic LABORATORY PANEL:  CBC Recent Labs  Lab 05/02/23 1054  WBC 7.0  HGB 10.6*  HCT 33.2*  PLT 228    Chemistries  Recent Labs  Lab 05/01/23 0326 05/02/23 1054 05/06/23 0601  NA 135   < > 136  K 4.8   < > 6.5*  CL 103   < > 101  CO2 20*   < > 14*  GLUCOSE 114*   < > 162*  BUN 62*   < > 114*  CREATININE 2.84*   < > 8.26*  CALCIUM  8.6*   < > 9.0  AST 30  --   --   ALT 22  --   --   ALKPHOS 143*  --   --   BILITOT 0.9  --   --    < > = values in this interval not displayed.   Assessment and Plan  Jeremiah Vance is a 53 y.o. male with medical history significant for obesity, recurrent cva, HFrecoveredEF, htn, ckd 3b, Type-2 dm, stage 4 gastric adenocarcinoma, osa, cirrhosis, a fib, who presents with the above.   Discharged 2 days ago, was admitted for chf exacerbation, was diuresed.   Reports chest pain, non-exertional, yesterday afternoon that led him to contact EMS. Chest pain resolved. Also reports dyspnea. No fever, has mild non-productive cough. Reports compliance with home meds last couple of days, is making  urine. Stable cancer-related pain, no vomiting or diarrhea.   --2/3-- patient's potassium is 6.4. He's NPO hands giving IV meds to see if potassium can be brought down. He is not a good candidate for any dialysis. This was discussed with Dr. Marcelino. Urology on board. Urology trying to reach with sister to discuss pros and cons for stent placement. Overall patient has a very poor prognosis. Per Urology PA--sister would like to d/w additional family members and wants to hold off on stent placement for now. Will resume diet. --2/4-- patient has enlarged pericardial effusion per echo and Dr. Darron aware of it. He does not recommend any pericardial synthesis given poor prognosis. Discussed above all results with worsening renal function with sister and patient's and Ms. Myers on the phone they both are in agreement with comfort care and hospice evaluation  Pleural effusion left-sided chronic but worsening recurrent pericardial effusion with early signs of tamponade -- status post thoracentesis with 1.5 L fluid removed no malignancy seen on cytology -- given poor prognosis no plans for any further procedures  AKI CKD 3b suspected due to meds and IV contrast/Malignancy affecting ureter left (as seen on Ct Abdomen) Severe hyperkalemia --CT does show possible metastatic left ureteral involvement but advises correlcation  with urinalysis. He did get IV contrast on 1/29. -- Creatinine trend 2.8-- 3.52-- 4.09--5.02--8.1 --per Dr Marcelino-- not a good candidate for HD   Acute hypoxic respiratory failure secondary to pleural effusion and history of CHF -- CTA no PE -- s/p thoracentesis  Chronic congestive heart failure systolic history of atrial fibrillation -- Echo recent showed EF of 40 to 45%, moderate LVH -- holding torsemide ,Losartan , farxiga  due to elevated creatinine  Stage 4 gastric adenocarcinoma Prognosis appears poor given underlying worsening comorbidities, poor functional status. Dr. Babara and  Sidra Borders have seen. Chemo will need to be paused until other medical conditions stable.  -- Oncology agrees with hospice home and comfort measures    Hx CVA  GAD Chronic pain Cirrhosis T2DM OSA  Debility,failure to thrive  TOC to send referral for hospice evaluation.   Patient overall has poor prognosis. DNR DNI  Procedures: thoracentesis Family communication : sister [name] and aunt Tawni Senters on 2/4 Consults : oncology, nephrology, cardiology, Urology CODE STATUS: DNR/DNI DVT Prophylaxis : comfort care Level of care: Med-Surg Status is: Inpatient  Await transfer to hospice facility once med available  TOTAL TIME TAKING CARE OF THIS PATIENT: 35 minutes.  >50% time spent on counselling and coordination of care  Note: This dictation was prepared with Dragon dictation along with smaller phrase technology. Any transcriptional errors that result from this process are unintentional.  Leita Blanch M.D    [facility]   CC: Primary care physician; Ostwalt, Janna, PA-C

## 2023-05-06 NOTE — Progress Notes (Signed)
   05/05/23 2037  Assess: MEWS Score  Temp 98.5 F (36.9 C)  BP 100/89  MAP (mmHg) 94  Pulse Rate 91  Resp (!) 22  SpO2 97 %  O2 Device Nasal Cannula  O2 Flow Rate (L/min) 4 L/min  Assess: MEWS Score  MEWS Temp 0  MEWS Systolic 1  MEWS Pulse 0  MEWS RR 1  MEWS LOC 0  MEWS Score 2  MEWS Score Color Yellow  Assess: if the MEWS score is Yellow or Red  Were vital signs accurate and taken at a resting state? Yes  Does the patient meet 2 or more of the SIRS criteria? Yes  Does the patient have a confirmed or suspected source of infection? Yes  MEWS guidelines implemented  Yes, yellow  Treat  MEWS Interventions Considered administering scheduled or prn medications/treatments as ordered  Take Vital Signs  Increase Vital Sign Frequency  Yellow: Q2hr x1, continue Q4hrs until patient remains green for 12hrs  Escalate  MEWS: Escalate Yellow: Discuss with charge nurse and consider notifying provider and/or RRT  Notify: Charge Nurse/RN  Name of Charge Nurse/RN Notified Arland H,RN  Assess: SIRS CRITERIA  SIRS Temperature  0  SIRS Respirations  1  SIRS Pulse 1  SIRS WBC 0  SIRS Score Sum  2

## 2023-05-06 NOTE — Progress Notes (Signed)
 Patient lethargic this shift not alert enough to swallow PO medications and with a critical potassium of 6.3, hospitalist notified. Hospitalist discussed plan of care with other healthcare team and family.  Patient a yellow MEWS due to HR and respiration elevated, MEWS protocol initiated, will continue to monitor.

## 2023-05-06 NOTE — Discharge Summary (Signed)
 Physician Discharge Summary   Patient: Jeremiah Vance MRN: [id number] DOB: 1971-03-28  Admit date:     04/30/2023  Discharge date: 05/06/23  Discharge Physician: Leita Blanch   PCP: Ostwalt, Janna, PA-C    Discharge Diagnoses: Principal Problem:   Pleural effusion Active Problems:   History of CVA (cerebrovascular accident)   Paroxysmal atrial fibrillation (HCC)   Acute on chronic systolic heart failure (HCC)   Chronic kidney disease, stage 3b (HCC)   Moderate obstructive sleep apnea   Other cirrhosis of liver (HCC)   HFrEF (heart failure with reduced ejection fraction) (HCC)   Gastric cancer (HCC)   Chemotherapy-induced neuropathy (HCC)   PAF (paroxysmal atrial fibrillation) (HCC)   Diabetes mellitus (HCC)   Class 1 obesity with serious comorbidity in adult   Benign essential HTN   Status post thoracentesis   Palliative care encounter   Elevated troponin   Chest pain   Hyperkalemia   Pericardial effusion  [name] is a 53 y.o. male with medical history significant for obesity, recurrent cva, HFrecoveredEF, htn, ckd 3b, Type-2 dm, stage 4 gastric adenocarcinoma, osa, cirrhosis, a fib, who presents with sob, failure to thrive at home   Discharged 2 days ago, was admitted for chf exacerbation, was diuresed.   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 He did get IV contrast on 1/29. -- Creatinine trend 2.8-- 3.52-- 4.09--5.02--8.1 --per Dr Marcelino-- not a good candidate for HD   Acute hypoxic respiratory failure secondary to pleural  effusion and history of CHF -- CTA no PE -- s/p thoracentesis   Chronic congestive heart failure systolic history of atrial fibrillation -- Echo recent showed EF of 40 to 45%, moderate LVH -- holding torsemide ,Losartan , farxiga  due to elevated creatinine   Stage 4 gastric adenocarcinoma Prognosis appears poor given underlying worsening comorbidities, poor functional status. Dr. Babara and Sidra Borders have seen. Chemo will need to be paused until other medical conditions stable.  -- Oncology agrees with hospice home and comfort measures    Hx CVA  GAD Chronic pain Cirrhosis T2DM OSA  Debility,failure to thrive    Consultants: nephrology, urology, cardiology, oncology Procedures performed: left-sided thoracentesis Disposition: Hospice care Diet recommendation:  Discharge Diet Orders (From admission, onward)     Start     Ordered   05/06/23 0000  Diet - low sodium heart healthy        05/06/23 1347           Regular diet DISCHARGE MEDICATION: Allergies as of 05/06/2023       Reactions   Pollen Extract Other (See Comments)   Sulfa Antibiotics         Medication List     STOP taking these medications    atorvastatin  40 MG tablet Commonly known as: LIPITOR   busPIRone  7.5 MG tablet Commonly known as: BUSPAR    diclofenac  Sodium 1 % Gel Commonly known as: VOLTAREN    Eliquis  5 MG Tabs tablet Generic drug: apixaban    Farxiga  10 MG Tabs tablet Generic drug: dapagliflozin  propanediol   feeding supplement Liqd   FreeStyle Libre 2 Sensor  Misc   FreeStyle Libre 3 Sensor Misc   losartan  25 MG tablet Commonly known as: Cozaar    metoprolol  tartrate 25 MG tablet Commonly known as: LOPRESSOR    oxyCODONE  5 MG immediate release tablet Commonly known as: Oxy IR/ROXICODONE    pantoprazole  40 MG tablet Commonly known as: PROTONIX    prochlorperazine  10 MG tablet Commonly known as: COMPAZINE    sertraline  25 MG tablet Commonly known as: ZOLOFT    sertraline  50  MG tablet Commonly known as: ZOLOFT    torsemide  10 MG tablet Commonly known as: DEMADEX        TAKE these medications    acetaminophen  325 MG tablet Commonly known as: TYLENOL  Take 2 tablets (650 mg total) by mouth every 4 (four) hours as needed for mild pain (or temp > 37.5 C (99.5 F)).   LORazepam  2 MG/ML injection Commonly known as: ATIVAN  Inject 0.5 mLs (1 mg total) into the vein every 4 (four) hours as needed for anxiety.   morphine  (PF) 2 MG/ML injection Inject 0.5 mLs (1 mg total) into the vein every 2 (two) hours as needed.   morphine  CONCENTRATE 10 mg / 0.5 ml concentrated solution Take 0.5 mLs (10 mg total) by mouth every 2 (two) hours as needed for moderate pain (pain score 4-6).   ondansetron  8 MG disintegrating tablet Commonly known as: ZOFRAN -ODT Take 1 tablet (8 mg total) by mouth every 8 (eight) hours as needed for nausea or vomiting.   polyvinyl alcohol  1.4 % ophthalmic solution Commonly known as: LIQUIFILM TEARS Place 1 drop into both eyes as needed for dry eyes.   scopolamine  1 MG/3DAYS Commonly known as: TRANSDERM-SCOP Place 1 patch (1.5 mg total) onto the skin every 3 (three) days. Start taking on: May 09, 2023        Follow-up Information     Dineen, Janna, PA-C Follow up.   Specialty: Physician Assistant Why: Hospital follow up Contact information: [address] [place] [address] [phone]                    Condition at discharge: poor  The results of significant diagnostics from this hospitalization (including imaging, microbiology, ancillary and laboratory) are listed below for reference.   Imaging Studies: ECHOCARDIOGRAM LIMITED Result Date: 05/05/2023    ECHOCARDIOGRAM LIMITED REPORT   Patient Name:   Jeremiah Vance Date of Exam: 05/05/2023 Medical Rec #:  [id number]       Height:       71.0 in Accession #:    [id number]      Weight:       233.7 lb Date of Birth:  [birth year]       BSA:          2.253 m  Patient Age:    52 years        BP:           129/96 mmHg Patient Gender: M               HR:           79 bpm. Exam Location:  [facility] Procedure: Limited Echo, Cardiac Doppler and Color Doppler Indications:     Pericardial Effusion I31.3  History:         Patient has prior history of Echocardiogram examinations, most                  recent 05/01/2023. Stroke; Risk Factors:Diabetes. Non-ischemic  cardiomyopathy.  Sonographer:     Christopher Furnace Referring Phys:  [id number] CADENCE H FURTH Diagnosing Phys: Deatrice Cage MD IMPRESSIONS  1. Left ventricular ejection fraction, by estimation, is 50 to 55%. The left ventricle has low normal function. The left ventricle has no regional wall motion abnormalities. There is moderate left ventricular hypertrophy. Left ventricular diastolic parameters are indeterminate.  2. Right ventricular systolic function is normal. The right ventricular size is normal. There is normal pulmonary artery systolic pressure.  3. Left atrial size was mildly dilated.  4. Large pericardial effusion. The pericardial effusion is circumferential. No RV or RA collapse but there is early tamponade my mitral inflow.  5. The mitral valve is normal in structure. No evidence of mitral valve regurgitation. No evidence of mitral stenosis.  6. The aortic valve is normal in structure. Aortic valve regurgitation is not visualized. Aortic valve sclerosis is present, with no evidence of aortic valve stenosis.  7. The inferior vena cava is dilated in size with <50% respiratory variability, suggesting right atrial pressure of 15 mmHg. FINDINGS  Left Ventricle: Left ventricular ejection fraction, by estimation, is 50 to 55%. The left ventricle has low normal function. The left ventricle has no regional wall motion abnormalities. The left ventricular internal cavity size was normal in size. There is moderate left ventricular hypertrophy. Left ventricular diastolic parameters are indeterminate. Right Ventricle:  The right ventricular size is normal. No increase in right ventricular wall thickness. Right ventricular systolic function is normal. There is normal pulmonary artery systolic pressure. The tricuspid regurgitant velocity is 1.97 m/s, and  with an assumed right atrial pressure of 15 mmHg, the estimated right ventricular systolic pressure is 30.5 mmHg. Left Atrium: Left atrial size was mildly dilated. Right Atrium: Right atrial size was normal in size. Pericardium: A large pericardial effusion is present. The pericardial effusion is circumferential. Mitral Valve: The mitral valve is normal in structure. No evidence of mitral valve stenosis. Tricuspid Valve: The tricuspid valve is normal in structure. Tricuspid valve regurgitation is trivial. No evidence of tricuspid stenosis. Aortic Valve: The aortic valve is normal in structure. Aortic valve regurgitation is not visualized. Aortic valve sclerosis is present, with no evidence of aortic valve stenosis. Pulmonic Valve: The pulmonic valve was normal in structure. Pulmonic valve regurgitation is trivial. No evidence of pulmonic stenosis. Aorta: The aortic root is normal in size and structure. Venous: The inferior vena cava is dilated in size with less than 50% respiratory variability, suggesting right atrial pressure of 15 mmHg. IAS/Shunts: No atrial level shunt detected by color flow Doppler. Additional Comments: Spectral Doppler performed. Color Doppler performed.  LEFT VENTRICLE PLAX 2D LVIDd:         3.90 cm LVIDs:         2.70 cm LV PW:         1.90 cm LV IVS:        1.60 cm  RIGHT VENTRICLE RV Basal diam:  3.60 cm RV Mid diam:    2.90 cm LEFT ATRIUM             Index        RIGHT ATRIUM           Index LA Vol (A2C):   73.9 ml 32.80 ml/m  RA Area:     17.10 cm LA Vol (A4C):   59.1 ml 26.23 ml/m  RA Volume:   45.50 ml  20.20 ml/m LA Biplane Vol: 69.1 ml 30.67 ml/m  MITRAL VALVE  TRICUSPID VALVE MV Area (PHT): 5.50 cm    TR Peak grad:   15.5 mmHg MV  Decel Time: 138 msec    TR Vmax:        197.00 cm/s MV E velocity: 36.20 cm/s MV A velocity: 64.50 cm/s MV E/A ratio:  0.56 Deatrice Cage MD Electronically signed by Deatrice Cage MD Signature Date/Time: 05/05/2023/11:53:48 AM    Final    DG Chest Port 1 View Result Date: 05/04/2023 CLINICAL DATA:  Pleural effusion, stage IV gastric cancer. EXAM: PORTABLE CHEST 1 VIEW COMPARISON:  04/30/2023 is CT chest 04/30/2023. FINDINGS: Right IJ power port tip is in the low SVC or at the SVC RA junction. Heart is enlarged, stable. Diffuse mixed interstitial and airspace opacification with bilateral pleural effusions. Findings are similar to 04/30/2023. IMPRESSION: Congestive heart failure. Electronically Signed   By: Newell Eke M.D.   On: 05/04/2023 17:43   DG Abd 1 View Result Date: 05/04/2023 CLINICAL DATA:  Hydronephrosis, stage IV gastric cancer EXAM: ABDOMEN - 1 VIEW COMPARISON:  CT scan 05/01/2023 FINDINGS: A central line terminates in the right atrium. Blunting of both costophrenic angles compatible with bilateral pleural effusions. Thoracolumbar spondylosis. Mild to moderate enlargement of the cardiopericardial silhouette; the 05/01/2023 CT scan showed a pericardial effusion. Formed stool noted in the colon. Dilute retained contrast in the left renal collecting system which appears moderately dilated. IMPRESSION: 1. Dilute retained contrast in the left renal collecting system which appears moderately dilated. 2. Mild to moderate enlargement of the cardiopericardial silhouette, compatible with pericardial effusion. 3. Bilateral pleural effusions. 4. Formed stool in the colon. Electronically Signed   By: Ryan Salvage M.D.   On: 05/04/2023 17:43   US  RENAL Result Date: 05/04/2023 CLINICAL DATA:  Hydronephrosis. EXAM: RENAL / URINARY TRACT ULTRASOUND COMPLETE COMPARISON:  May 01, 2023. FINDINGS: Right Kidney: Renal measurements: 9.2 x 5.9 x 5.1 cm = volume: 145 mL. Echogenicity within normal limits. No  mass or hydronephrosis visualized. Left Kidney: Renal measurements: 10.8 x 5.9 x 4.9 cm = volume: 163 mL. Echogenicity within normal limits. No mass visualized. Moderate hydronephrosis is noted. Two calculi are noted, each measuring approximately 7 mm. Bladder: Not visualized. Other: None. IMPRESSION: Moderate left hydronephrosis. Nonobstructive left nephrolithiasis. Normal right kidney. Electronically Signed   By: Lynwood Landy Raddle M.D.   On: 05/04/2023 17:40   CT ABDOMEN PELVIS WO CONTRAST Result Date: 05/01/2023 CLINICAL DATA:  History of gastric adenocarcinoma with nausea and vomiting. * Tracking Code: BO * EXAM: CT ABDOMEN AND PELVIS WITHOUT CONTRAST TECHNIQUE: Multidetector CT imaging of the abdomen and pelvis was performed following the standard protocol without IV contrast. RADIATION DOSE REDUCTION: This exam was performed according to the departmental dose-optimization program which includes automated exposure control, adjustment of the mA and/or kV according to patient size and/or use of iterative reconstruction technique. COMPARISON:  CT abdomen and pelvis dated 04/10/2023, CTA chest dated 04/30/2023, nuclear medicine PET dated 12/31/2022 FINDINGS: Lower chest: Partially imaged central venous catheter terminates at the superior cavoatrial junction. Re-expansion of partially imaged lingula and left lower lobe status post thoracentesis. Diffuse patchy ground-glass opacities of the lingula and partially imaged left lower lobe. Similar large right and decreased moderate left pleural effusions. No pneumothorax. Similar pericardial effusion. Coronary artery calcifications. Hepatobiliary: No focal hepatic lesions. No intra or extrahepatic biliary ductal dilation. Contracted gallbladder demonstrates cholelithiasis. Adrenals Pancreas: No focal lesions or main ductal dilation. Spleen: Normal in size without focal abnormality. Adrenals/Urinary Tract: Unchanged 1.3 cm left adrenal nodule  measures 13 HU, not  previously FDG-avid, likely adenoma. No specific follow-up imaging recommended. No right adrenal nodule. Mild left hydronephrosis contains excreted renal contrast material from recent CTA chest. Contrast is seen to the level of the proximal ureter, which demonstrates mild circumferential mural thickening with adjacent soft tissue irregularity (2:52), inseparable from the anterior psoas. Excreted contrast material within the urinary bladder. Stomach/Bowel: Decreased conspicuity of mural thickening involving the gastric cardia. No evidence of bowel wall thickening, distention, or inflammatory changes. Normal appendix, which is contained within the hernia. Vascular/Lymphatic: Aortic atherosclerosis. Partially imaged paraesophageal lymph nodes measuring up to 9 mm (2:2) are unchanged. Continued decrease in size of perigastric node measuring 2.2 x 2.0 cm (2:34), previously 3.7 x 2.1 cm. Interval increased multi station periportal, retroaortic, and pelvic lymphadenopathy, for example -14 mm portacaval (2:33), previously 13 mm -10 mm left para-aortic (2:39), previously 5 mm -14 mm right external iliac (2:74), previously 12 mm Reproductive: Prostate is unremarkable. Other: Trace ascites.  No free air or fluid collection. Musculoskeletal: No acute or abnormal lytic or blastic osseous lesions. Multilevel degenerative changes of the partially imaged thoracic and lumbar spine. Large ventral midline lower abdominal wall hernia containing nonobstructed loops of small and large bowel, as before. IMPRESSION: 1. Mild left hydronephrosis contains excreted renal contrast material from recent CTA chest, suggestive of delayed nephrogram. Contrast is seen to the level of the proximal ureter, which demonstrates mild circumferential mural thickening with adjacent soft tissue irregularity, inseparable from the anterior psoas. Findings are suspicious for ureteral involvement by metastatic disease, less likely ascending urinary tract  infection. Recommend correlation with urinalysis. 2. Interval increased multi station periportal, retroaortic, and pelvic lymphadenopathy, indeterminate, however suspicious for metastatic disease. 3. Decreased conspicuity of mural thickening involving the gastric cardia. Decreased size of perigastric nodule. 4. Re-expansion of partially imaged lingula and left lower lobe status post thoracentesis. Diffuse patchy ground-glass opacities of the lingula and partially imaged left lower lobe, which may represent re-expansion pulmonary edema. 5. Similar large right and decreased moderate left pleural effusions. 6. Similar pericardial effusion. 7. Aortic Atherosclerosis (ICD10-I70.0). Coronary artery calcifications. Assessment for potential risk factor modification, dietary therapy or pharmacologic therapy may be warranted, if clinically indicated. Electronically Signed   By: Limin  Xu M.D.   On: 05/01/2023 15:51   ECHOCARDIOGRAM LIMITED Result Date: 05/01/2023    ECHOCARDIOGRAM LIMITED REPORT   Patient Name:   JUNIOUS RAGONE Date of Exam: 04/30/2023 Medical Rec #:  [id number]       Height:       71.0 in Accession #:    [id number]      Weight:       233.7 lb Date of Birth:  1971-03-26       BSA:          2.253 m Patient Age:    52 years        BP:           127/89 mmHg Patient Gender: M               HR:           95 bpm. Exam Location:  [facility] Procedure: 2D Echo and Limited Echo Indications:     I31.3 Pericardial Effusion  History:         Patient has prior history of Echocardiogram examinations, most                  recent 04/24/2023. Cardiomyopathy, Stroke, Arrythmias:Atrial  Fibrillation; Risk Factors:Hypertension and Diabetes.  Sonographer:     Carl Rodgers-Jones RDCS Referring Phys:  [id number] CHRISTOPHER RONALD BERGE Diagnosing Phys: Evalene Lunger MD IMPRESSIONS  1. Left ventricular ejection fraction, by estimation, is 40 to 45%. The left ventricle has mildly decreased function. The left ventricle  demonstrates global hypokinesis. There is moderate left ventricular hypertrophy. Left ventricular diastolic parameters are indeterminate.  2. Right ventricular systolic function is mildly reduced. The right ventricular size is normal. Tricuspid regurgitation signal is inadequate for assessing PA pressure.  3. Moderate pericardial effusion. There is no evidence of cardiac tamponade.  4. The mitral valve is normal in structure. No evidence of mitral valve regurgitation. No evidence of mitral stenosis.  5. The aortic valve is normal in structure. Aortic valve regurgitation is not visualized. No aortic stenosis is present.  6. The inferior vena cava is dilated in size with >50% respiratory variability, suggesting right atrial pressure of 8 mmHg. FINDINGS  Left Ventricle: Left ventricular ejection fraction, by estimation, is 40 to 45%. The left ventricle has mildly decreased function. The left ventricle demonstrates global hypokinesis. The left ventricular internal cavity size was normal in size. There is  moderate left ventricular hypertrophy. Left ventricular diastolic parameters are indeterminate. Right Ventricle: The right ventricular size is normal. No increase in right ventricular wall thickness. Right ventricular systolic function is mildly reduced. Tricuspid regurgitation signal is inadequate for assessing PA pressure. Left Atrium: Left atrial size was normal in size. Right Atrium: Right atrial size was normal in size. Pericardium: A moderately sized pericardial effusion is present. There is no evidence of cardiac tamponade. Mitral Valve: The mitral valve is normal in structure. No evidence of mitral valve stenosis. Tricuspid Valve: The tricuspid valve is normal in structure. Tricuspid valve regurgitation is not demonstrated. No evidence of tricuspid stenosis. Aortic Valve: The aortic valve is normal in structure. Aortic valve regurgitation is not visualized. No aortic stenosis is present. Pulmonic Valve: The  pulmonic valve was normal in structure. Pulmonic valve regurgitation is not visualized. No evidence of pulmonic stenosis. Aorta: The aortic root is normal in size and structure. Venous: The inferior vena cava is dilated in size with greater than 50% respiratory variability, suggesting right atrial pressure of 8 mmHg. IAS/Shunts: No atrial level shunt detected by color flow Doppler. LEFT VENTRICLE PLAX 2D LVIDd:         4.90 cm LVIDs:         4.10 cm LV PW:         1.30 cm LV IVS:        1.20 cm  IVC IVC diam: 2.50 cm LEFT ATRIUM         Index LA diam:    5.20 cm 2.31 cm/m   AORTA Ao Root diam: 3.80 cm Evalene Lunger MD Electronically signed by Evalene Lunger MD Signature Date/Time: 05/01/2023/11:13:46 AM    Final    DG Chest Port 1 View Result Date: 04/30/2023 CLINICAL DATA:  Status post thoracentesis EXAM: PORTABLE CHEST 1 VIEW COMPARISON:  CT chest dated April 30, 2023. Chest radiograph dated April 29, 2023. FINDINGS: Status post thoracentesis with moderate-to-large left pleural effusion, decreased in size compared to the prior exam. There is improved aeration of the left upper lung. Persistent left basilar atelectasis. Small to moderate-sized layering right pleural effusion with associated basilar atelectasis. No pneumothorax. Central pulmonary vascular congestion. Similar enlargement of the cardiac silhouette. Stable right chest Port-A-Cath in place. Visualized osseous structures are unchanged. IMPRESSION: 1. Status post thoracentesis with  decreased size of a moderate-to-large left pleural effusion and improved aeration within the left upper lung. Left basilar atelectasis. 2. Small to moderate-sized layering right pleural effusion with associated basilar atelectasis. 3. Central pulmonary vascular congestion. Electronically Signed   By: Harrietta Sherry M.D.   On: 04/30/2023 12:10   US  THORACENTESIS ASP PLEURAL SPACE W/IMG GUIDE Result Date: 04/30/2023 INDICATION: Patient with history of CHF, CKD3,  cirrhosis, stage 4 gastric adenocarcinoma admitted for chest pain and dyspnea. Imaging shows new bilateral pleural effusions. Request for diagnostic and therapeutic thoracentesis. EXAM: ULTRASOUND GUIDED LEFT THORACENTESIS MEDICATIONS: 7 mL 1% lidocaine  COMPLICATIONS: None immediate. PROCEDURE: An ultrasound guided thoracentesis was thoroughly discussed with the patient and questions answered. The benefits, risks, alternatives and complications were also discussed. The patient understands and wishes to proceed with the procedure. Written consent was obtained. Ultrasound was performed to localize and mark an adequate pocket of fluid in the left chest. The area was then prepped and draped in the normal sterile fashion. 1% Lidocaine  was used for local anesthesia. Under ultrasound guidance a 6 Fr Safe-T-Centesis catheter was introduced. Thoracentesis was performed. The catheter was removed and a dressing applied. FINDINGS: A total of approximately 1.5 L of clear yellow fluid was removed. Samples were sent to the laboratory as requested by the clinical team. IMPRESSION: Successful ultrasound guided left thoracentesis yielding 1.5 L of pleural fluid. Performed by Clotilda Hesselbach, PA-C Electronically Signed   By: Wilkie Lent M.D.   On: 04/30/2023 12:04   CT Angio Chest PE W and/or Wo Contrast Result Date: 04/30/2023 CLINICAL DATA:  53 year old male with history of shortness of breath and hypoxia. Left-sided chest pain. History of gastric cancer. * Tracking Code: BO * EXAM: CT ANGIOGRAPHY CHEST WITH CONTRAST TECHNIQUE: Multidetector CT imaging of the chest was performed using the standard protocol during bolus administration of intravenous contrast. Multiplanar CT image reconstructions and MIPs were obtained to evaluate the vascular anatomy. RADIATION DOSE REDUCTION: This exam was performed according to the departmental dose-optimization program which includes automated exposure control, adjustment of the mA  and/or kV according to patient size and/or use of iterative reconstruction technique. CONTRAST:  50mL OMNIPAQUE  IOHEXOL  350 MG/ML SOLN COMPARISON:  CT of the chest, abdomen and pelvis 04/10/2023. FINDINGS: Cardiovascular: Study is slightly limited by suboptimal contrast bolus. With these limitations in mind, there is no central, lobar or segmental sized filling defect in the pulmonary arterial tree to indicate clinically relevant pulmonary embolism. Smaller subsegmental sized pulmonary emboli can not be entirely excluded. Heart size is normal. Moderate to large pericardial effusion. No definite pericardial calcification. There is aortic atherosclerosis, as well as atherosclerosis of the great vessels of the mediastinum and the coronary arteries, including calcified atherosclerotic plaque in the left anterior descending coronary artery. Mild calcifications of the aortic valve and mitral annulus. Right internal jugular single-lumen porta cath with tip terminating at the superior cavoatrial junction. Mediastinum/Nodes: No pathologically enlarged mediastinal or hilar lymph nodes. Esophagus is unremarkable in appearance. No axillary lymphadenopathy. Lungs/Pleura: Large bilateral pleural effusions (left-greater-than-right), predominantly lying dependently. Associated with these is extensive passive atelectasis in the lungs bilaterally. No definite consolidative airspace disease. Mild diffuse ground-glass attenuation and mild interlobular septal thickening in the lungs suggesting a background of mild interstitial pulmonary edema. Several small scattered pulmonary nodules are noted measuring 5 mm or less in size, similar to several prior examinations dating back to 2023, favored to be benign. No larger more suspicious appearing pulmonary nodules or masses are noted. Upper Abdomen: Aortic atherosclerosis.  Musculoskeletal: There are no aggressive appearing lytic or blastic lesions noted in the visualized portions of the  skeleton. Review of the MIP images confirms the above findings. IMPRESSION: 1. No central, lobar or segmental sized pulmonary embolism. 2. Moderate to large pericardial effusion. 3. Large bilateral pleural effusions (left-greater-than-right) with extensive passive atelectasis in the lungs bilaterally. 4. Mild interstitial pulmonary edema. 5. Aortic atherosclerosis, in addition to left anterior descending coronary artery disease. 6. There are calcifications of the aortic valve and mitral annulus. Echocardiographic correlation for evaluation of potential valvular dysfunction may be warranted if clinically indicated. 7. Small pulmonary nodules measuring 5 mm or less in size, stable compared to prior examinations, favored to be benign (although continued attention on future follow-up imaging is recommended given the patient's history of gastric cancer). Aortic Atherosclerosis (ICD10-I70.0). Electronically Signed   By: Toribio Aye M.D.   On: 04/30/2023 07:24   DG Chest 2 View Result Date: 04/29/2023 CLINICAL DATA:  Chest pain EXAM: CHEST - 2 VIEW COMPARISON:  04/23/2023 FINDINGS: Persistent consolidation/atelectasis at the left lung base with probable adjacent effusion. Some increase in perihilar pulmonary vascular congestion suspected. Mild thickening of or fluid in the interlobar fissures as before. No pneumothorax. Stable right IJ port catheter. Heart size and mediastinal contours are within normal limits. Visualized bones unremarkable. IMPRESSION: 1. Persistent left basilar consolidation/atelectasis with probable adjacent effusion. 2. Increased perihilar pulmonary vascular congestion. Electronically Signed   By: JONETTA Faes M.D.   On: 04/29/2023 17:24   ECHOCARDIOGRAM COMPLETE Result Date: 04/24/2023    ECHOCARDIOGRAM REPORT   Patient Name:   CAI ANFINSON Date of Exam: 04/24/2023 Medical Rec #:  [id number]       Height:       71.0 in Accession #:    [id number]      Weight:       245.0 lb Date of Birth:   1970-04-07       BSA:          2.298 m Patient Age:    52 years        BP:           157/91 mmHg Patient Gender: M               HR:           90 bpm. Exam Location:  [facility] Procedure: 2D Echo, Cardiac Doppler and Color Doppler Indications:     CHF  History:         Patient has prior history of Echocardiogram examinations, most                  recent 11/22/2022. CHF, Stroke, Arrythmias:Atrial Fibrillation,                  Signs/Symptoms:Fatigue and Shortness of Breath; Risk                  Factors:Hypertension, Diabetes, Dyslipidemia and Sleep Apnea.                  CKD.  Sonographer:     Naomie Reef Referring Phys:  [id number] BURNARD A GRIFFITH Diagnosing Phys: Evalene Lunger MD  Sonographer Comments: Patient is obese. IMPRESSIONS  1. Left ventricular ejection fraction, by estimation, is 40 to 45%. The left ventricle has mildly decreased function. The left ventricle demonstrates global hypokinesis. There is moderate left ventricular hypertrophy. Left ventricular diastolic parameters are indeterminate.  2. Right ventricular systolic function is mildly reduced. The right ventricular  size is normal.  3. Left atrial size was moderately dilated.  4. A small pericardial effusion is present.  5. The mitral valve is normal in structure. Mild mitral valve regurgitation. No evidence of mitral stenosis.  6. The aortic valve is normal in structure. Aortic valve regurgitation is not visualized. No aortic stenosis is present.  7. There is mild dilatation of the aortic root, measuring 41 mm. There is mild dilatation of the ascending aorta, measuring 42 mm.  8. The inferior vena cava is normal in size with greater than 50% respiratory variability, suggesting right atrial pressure of 3 mmHg. FINDINGS  Left Ventricle: Left ventricular ejection fraction, by estimation, is 40 to 45%. The left ventricle has mildly decreased function. The left ventricle demonstrates global hypokinesis. The left ventricular internal cavity size was  normal in size. There is  moderate left ventricular hypertrophy. Left ventricular diastolic parameters are indeterminate. Right Ventricle: The right ventricular size is normal. No increase in right ventricular wall thickness. Right ventricular systolic function is mildly reduced. Left Atrium: Left atrial size was moderately dilated. Right Atrium: Right atrial size was normal in size. Pericardium: A small pericardial effusion is present. Mitral Valve: The mitral valve is normal in structure. Mild mitral annular calcification. Mild mitral valve regurgitation. No evidence of mitral valve stenosis. MV peak gradient, 4.4 mmHg. The mean mitral valve gradient is 2.0 mmHg. Tricuspid Valve: The tricuspid valve is normal in structure. Tricuspid valve regurgitation is not demonstrated. No evidence of tricuspid stenosis. Aortic Valve: The aortic valve is normal in structure. Aortic valve regurgitation is not visualized. No aortic stenosis is present. Aortic valve mean gradient measures 2.7 mmHg. Aortic valve peak gradient measures 5.0 mmHg. Aortic valve area, by VTI measures 3.00 cm. Pulmonic Valve: The pulmonic valve was normal in structure. Pulmonic valve regurgitation is not visualized. No evidence of pulmonic stenosis. Aorta: The aortic root is normal in size and structure. There is mild dilatation of the aortic root, measuring 41 mm. There is mild dilatation of the ascending aorta, measuring 42 mm. Venous: The inferior vena cava is normal in size with greater than 50% respiratory variability, suggesting right atrial pressure of 3 mmHg. IAS/Shunts: No atrial level shunt detected by color flow Doppler.  LEFT VENTRICLE PLAX 2D LVIDd:         5.40 cm LVIDs:         2.70 cm LV PW:         1.50 cm LV IVS:        1.60 cm LVOT diam:     2.20 cm LV SV:         60 LV SV Index:   26 LVOT Area:     3.80 cm  RIGHT VENTRICLE RV Basal diam:  3.80 cm RV Mid diam:    3.70 cm LEFT ATRIUM             Index        RIGHT ATRIUM            Index LA diam:        4.90 cm 2.13 cm/m   RA Area:     20.10 cm LA Vol (A2C):   73.9 ml 32.15 ml/m  RA Volume:   57.10 ml  24.84 ml/m LA Vol (A4C):   97.4 ml 42.38 ml/m LA Biplane Vol: 87.3 ml 37.98 ml/m  AORTIC VALVE                    PULMONIC  VALVE AV Area (Vmax):    3.17 cm     PV Vmax:       0.88 m/s AV Area (Vmean):   2.72 cm     PV Peak grad:  3.1 mmHg AV Area (VTI):     3.00 cm AV Vmax:           111.67 cm/s AV Vmean:          77.900 cm/s AV VTI:            0.200 m AV Peak Grad:      5.0 mmHg AV Mean Grad:      2.7 mmHg LVOT Vmax:         93.25 cm/s LVOT Vmean:        55.650 cm/s LVOT VTI:          0.158 m LVOT/AV VTI ratio: 0.79  AORTA Ao Root diam: 4.10 cm Ao Asc diam:  4.20 cm MITRAL VALVE MV Area (PHT): 3.65 cm    SHUNTS MV Area VTI:   2.63 cm    Systemic VTI:  0.16 m MV Peak grad:  4.4 mmHg    Systemic Diam: 2.20 cm MV Mean grad:  2.0 mmHg MV Vmax:       1.05 m/s MV Vmean:      58.7 cm/s MV Decel Time: 208 msec MV E velocity: 84.00 cm/s Evalene Lunger MD Electronically signed by Evalene Lunger MD Signature Date/Time: 04/24/2023/1:11:41 PM    Final    DG Chest 2 View Result Date: 04/23/2023 CLINICAL DATA:  Congestion, shortness of breath.  COPD EXAM: CHEST - 2 VIEW COMPARISON:  10/27/2022 FINDINGS: Right Port-A-Cath in place with the tip at the cavoatrial junction. Cardiomegaly with vascular congestion. Left pleural effusion with perihilar and lower lobe airspace opacities, left greater than right. No acute bony abnormality. IMPRESSION: Cardiomegaly with vascular congestion and bilateral perihilar opacities and lower lobe opacities, left greater than right. Favor asymmetric edema. Cannot exclude pneumonia. Small to moderate left pleural effusion. Electronically Signed   By: Franky Crease M.D.   On: 04/23/2023 17:40   CT CHEST ABDOMEN PELVIS WO CONTRAST Result Date: 04/15/2023 CLINICAL DATA:  Restaging gastric cancer EXAM: CT CHEST, ABDOMEN AND PELVIS WITHOUT CONTRAST TECHNIQUE:  Multidetector CT imaging of the chest, abdomen and pelvis was performed following the standard protocol without IV contrast. RADIATION DOSE REDUCTION: This exam was performed according to the departmental dose-optimization program which includes automated exposure control, adjustment of the mA and/or kV according to patient size and/or use of iterative reconstruction technique. COMPARISON:  PET-CT dated 12/31/2022 FINDINGS: CT CHEST FINDINGS Cardiovascular: Mild cardiomegaly.  Small pericardial effusion, new. No evidence of thoracic aortic aneurysm. Atherosclerotic calcifications of the aortic arch. Mild coronary atherosclerosis of the LAD and right coronary artery. Right chest port terminates at the cavoatrial junction. Mediastinum/Nodes: Dominant 12 mm short axis AP window node, similar. However, additional mediastinal nodes are favored to be mildly progressive, including a 2 mm short axis low right paratracheal node (series 2/image 23) and new prevascular nodes measuring up to 7 mm short axis (series 2/image 21). Visualized thyroid  is unremarkable. Lungs/Pleura: 5 mm triangular subpleural nodule in the right lower lobe (series 4/image 109), unchanged, likely benign. Additional subpleural nodularity in the right upper and right middle lobes, measuring up to 5 mm, benign. No new/suspicious pulmonary nodules. Mild centrilobular emphysematous changes, upper lung predominant. Moderate left and small to moderate right pleural effusions, new. Associated lingular and left lower lobe compressive atelectasis. No  pneumothorax. Musculoskeletal: Degenerative changes of the lower thoracic spine. CT ABDOMEN PELVIS FINDINGS Hepatobiliary: Unenhanced liver is notable for a mildly macronodular contour but no focal hepatic lesion. Cholelithiasis, without associated inflammatory changes. No intrahepatic or extrahepatic duct dilatation. Pancreas: Within normal limits. Spleen: Within normal limits. Adrenals/Urinary Tract: Adrenal  glands are within normal limits. Kidneys are within normal limits. Renal vascular calcifications. Mild left hydronephrosis with narrowing at the UVJ. Thick-walled bladder, although underdistended. 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 No evidence of bowel obstruction. Appendix is not discretely visualized. Sigmoid diverticulosis, without evidence of diverticulitis. Large complex midline lower abdominal ventral hernia containing multiple loops of nondilated bowel (series 2/image 106). Vascular/Lymphatic: No evidence of abdominal aortic aneurysm. Atherosclerotic calcifications of the abdominal aorta and branch vessels, although vessels remain patent. Perigastric soft tissue implant measuring 3.2 x 2.3 cm (series 2/image 53), previously 3.7 x 2.1 cm, favored to be mildly improved. Additional small periaortic/retroperitoneal nodes measuring up to 7 mm short axis (series 2/image 89), grossly unchanged. Reproductive: Prostate is unremarkable. Other: Trace pelvic ascites, new. Musculoskeletal: Degenerative changes of the lumbar spine. 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 Mediastinal, perigastric, and retroperitoneal nodal metastases, overall grossly unchanged, although possibly mildly progressive in the chest. Moderate left and small to moderate right pleural effusions, new. Small pericardial effusion, new. Trace pelvic ascites, new. Additional stable ancillary findings as above. Aortic Atherosclerosis (ICD10-I70.0) and Emphysema (ICD10-J43.9). Electronically Signed   By: Pinkie Pebbles M.D.   On: 04/15/2023 00:24    Microbiology: Results for orders placed or performed during the hospital encounter of 04/30/23  Resp panel by RT-PCR (RSV, Flu A&B, Covid) Anterior Nasal  Swab     Status: None   Collection Time: 04/30/23  6:22 AM   Specimen: Anterior Nasal Swab  Result Value Ref Range Status   SARS Coronavirus 2 by RT PCR NEGATIVE NEGATIVE Final    Comment: (NOTE) SARS-CoV-2 target nucleic acids are NOT DETECTED.  The SARS-CoV-2 RNA is generally detectable in upper respiratory specimens during the acute phase of infection. The lowest concentration of SARS-CoV-2 viral copies this assay can detect is 138 copies/mL. A negative result does not preclude SARS-Cov-2 infection and should not be used as the sole basis for treatment or other patient management decisions. A negative result may occur with  improper specimen collection/handling, submission of specimen other than nasopharyngeal swab, presence of viral mutation(s) within the areas targeted by this assay, and inadequate number of viral copies(<138 copies/mL). A negative result must be combined with clinical observations, patient history, and epidemiological information. The expected result is Negative.  Fact Sheet for Patients:  bloggercourse.com  Fact Sheet for Healthcare Providers:  seriousbroker.it  This test is no t yet approved or cleared by the [place]  FDA and  has been authorized for detection and/or diagnosis of SARS-CoV-2 by FDA under an Emergency Use Authorization (EUA). This EUA will remain  in effect (meaning this test can be used) for the duration of the COVID-19 declaration under Section 564(b)(1) of the Act, 21 U.S.C.section 360bbb-3(b)(1), unless the authorization is terminated  or revoked sooner.       Influenza A by PCR NEGATIVE NEGATIVE Final   Influenza B by PCR NEGATIVE NEGATIVE Final    Comment: (NOTE) The Xpert Xpress SARS-CoV-2/FLU/RSV plus assay is intended as an aid in the diagnosis of influenza from Nasopharyngeal  swab specimens and should not be used as a sole basis for treatment. Nasal washings and aspirates  are unacceptable for Xpert Xpress SARS-CoV-2/FLU/RSV testing.  Fact Sheet for Patients: bloggercourse.com  Fact Sheet for Healthcare Providers: seriousbroker.it  This test is not yet approved or cleared by the [place]  FDA and has been authorized for detection and/or diagnosis of SARS-CoV-2 by FDA under an Emergency Use Authorization (EUA). This EUA will remain in effect (meaning this test can be used) for the duration of the COVID-19 declaration under Section 564(b)(1) of the Act, 21 U.S.C. section 360bbb-3(b)(1), unless the authorization is terminated or revoked.     Resp Syncytial Virus by PCR NEGATIVE NEGATIVE Final    Comment: (NOTE) Fact Sheet for Patients: bloggercourse.com  Fact Sheet for Healthcare Providers: seriousbroker.it  This test is not yet approved or cleared by the [place]  FDA and has been authorized for detection and/or diagnosis of SARS-CoV-2 by FDA under an Emergency Use Authorization (EUA). This EUA will remain in effect (meaning this test can be used) for the duration of the COVID-19 declaration under Section 564(b)(1) of the Act, 21 U.S.C. section 360bbb-3(b)(1), unless the authorization is terminated or revoked.  Performed at [facility], [address] [place], [place] [address]   Body fluid culture w Gram Stain     Status: None   Collection Time: 04/30/23 10:52 AM   Specimen: PATH Cytology Pleural fluid  Result Value Ref Range Status   Specimen Description   Final    PLEURAL Performed at [facility], [address], [place], [place] [address]    Special Requests   Final    PLEURAL Performed at [facility], [address] [place], [place] [address]    Gram Stain   Final    FEW WBC PRESENT, PREDOMINANTLY MONONUCLEAR NO ORGANISMS SEEN    Culture   Final    NO GROWTH 3 DAYS Performed at [facility], [address], [place], [place] [address]    Report Status 05/03/2023 FINAL  Final    Labs: CBC: Recent Labs  Lab 04/29/23 1655 05/01/23 0326 05/02/23 1054  WBC 5.9 7.6 7.0  HGB 11.5* 11.0* 10.6*  HCT 35.9* 35.3* 33.2*  MCV 90.2 91.0 89.2  PLT 182 188 228   Basic Metabolic Panel: Recent Labs  Lab 05/02/23 1054 05/03/23 0540 05/04/23 0337 05/05/23 0439 05/05/23 1401 05/05/23 2019 05/06/23 0601  NA 132* 133* 133* 135  --   --  136  K 4.5 4.5 5.4* 6.1* 6.0* 6.3* 6.5*  CL 101 99 100 102  --   --  101  CO2 19* 17* 18* 16*  --   --  14*  GLUCOSE 165* 138* 135* 184*  --   --  162*  BUN 78* 87* 96* 102*  --   --  114*  CREATININE 3.52* 4.09* 5.09* 6.45*  --   --  8.26*  CALCIUM  8.6* 8.9 9.0 9.0  --   --  9.0   Liver Function Tests: Recent Labs  Lab 05/01/23 0326  AST 30  ALT 22  ALKPHOS 143*  BILITOT 0.9  PROT 7.1  7.3  ALBUMIN  3.0*   CBG: Recent Labs  Lab 05/04/23 0508 05/04/23 0840 05/05/23 1009 05/05/23 1719 05/05/23 2054  GLUCAP 125* 126* 158* 141* 179*    Discharge time spent: greater than 30 minutes.  Signed: Leita Blanch, MD [facility] 05/06/2023

## 2023-05-06 NOTE — Progress Notes (Signed)
 [facility] Liaison Note  Received request from Transitions of Care Manger for family interest in [facility].  Eligibility has been confirmed.  Spoke with  family to confirm interest and explain services.  Family agreeable to transfer today.  Transitions of Care manager aware.  RN please call report to [phone] [facility]) prior to patient leaving the unit.  Please send signed and completed DNR with patient at discharge.  Thank you  Redge Gainer,  [facility] Liaison [phone]

## 2023-05-06 NOTE — Progress Notes (Signed)
 PT Cancellation Note  Patient Details Name: DELDRICK LINCH MRN: [id number] DOB: 09/25/1970   Cancelled Treatment:    Reason Eval/Treat Not Completed: Other (comment).  Pt has been placed on comfort care at this time.  Please re-consult PT services if need arises.  Pt will be formally discharged at this time.   Fonda Simpers, PT, DPT Physical Therapist - [facility]  05/06/23, 9:57 AM

## 2023-05-07 DIAGNOSIS — I639 Cerebral infarction, unspecified: Secondary | ICD-10-CM | POA: Diagnosis not present

## 2023-05-07 DIAGNOSIS — K7460 Unspecified cirrhosis of liver: Secondary | ICD-10-CM | POA: Diagnosis not present

## 2023-05-07 DIAGNOSIS — I10 Essential (primary) hypertension: Secondary | ICD-10-CM | POA: Diagnosis not present

## 2023-05-07 DIAGNOSIS — I482 Chronic atrial fibrillation, unspecified: Secondary | ICD-10-CM | POA: Diagnosis not present

## 2023-05-07 DIAGNOSIS — C169 Malignant neoplasm of stomach, unspecified: Secondary | ICD-10-CM | POA: Diagnosis not present

## 2023-05-07 DIAGNOSIS — I3139 Other pericardial effusion (noninflammatory): Secondary | ICD-10-CM | POA: Diagnosis not present

## 2023-05-07 DIAGNOSIS — C786 Secondary malignant neoplasm of retroperitoneum and peritoneum: Secondary | ICD-10-CM | POA: Diagnosis not present

## 2023-05-07 DIAGNOSIS — I5020 Unspecified systolic (congestive) heart failure: Secondary | ICD-10-CM | POA: Diagnosis not present

## 2023-05-07 DIAGNOSIS — N1832 Chronic kidney disease, stage 3b: Secondary | ICD-10-CM | POA: Diagnosis not present

## 2023-05-08 ENCOUNTER — Ambulatory Visit: Payer: Medicaid Other | Admitting: Medical

## 2023-05-08 NOTE — Progress Notes (Deleted)
 Cardiology Office Note:  .   Date:  05/08/2023  ID:  HIEU HERMS, DOB 02-03-1971, MRN [id number] PCP: Dineen Channel, PA-C  [facility] Providers Cardiologist:  Redell Cave, MD { Click to update primary MD,subspecialty MD or APP then REFRESH:1}   History of Present Illness: .   Jeremiah Vance is a 53 y.o. 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  57 2019 showed EF of 40 to 45%, diffuse hypokinesis worse in the inferior and inferoseptal walls, mod LVH, trivial AI, trivial pericardial effusion.  No ischemic time.  Last echo June 2019 showed improved EF of 60 to 65%.  In 2021, in the setting of hypertension and intracardiac echo showed reduced EF of 30 to 35%.  He underwent stress test in October 2023, which showed no ischemia or infarct.  Unfortunately, he suffered a stroke in October 2023.  Subsequent event monitoring January 2024 showed PAF  He was started on Eliquis , did not follow-up.  Patient was admitted in February 2024 with recurrent anemia, obesity.  He was also diagnosed with GE junction carcinoma and was started on Eliquis  2.5 twice daily in the setting of concerns related to bleeding/cancer.  Echo showed EF 55 to 60%.  Patient had multiple admissions in 2024 for anemia earlier in the year and stroke in August 2024 followed by rehab stay.  He was admitted to [facility] January 2025 due to dyspnea in the setting of medication, HFmrEF, permanent Afib, and avcute on chronic stage 3 kidney dz. he was diagnosed with symptoms improvement also associated with bump in creatinine quiring therapy.  Orthostasis resulted in discontinuation of hydralazine .  Echo showed EF of 40 to 45%, global hypokinesis, reduced RV function, dilated left atrium, small pericardial  effusion last, mild MR, and mildly dilated aortic root and ascending aorta.  Creatinine rose from 1.09 April 2030 bilateral.  Discharged home on losartan  and metoprolol  and advised to continue torsemide  10 mg daily.  The patient presented to the ER on 04/30/23 noted to be afebrile and hypotensive.  EKG showed A-fib with a heart rate in the 70s.  Creatinine 2.36 with mildly elevated troponin with flat trend.  CT of the chest was negative for PE but showed moderate to large pericardial effusion with large bilateral pleural effusions left greater than right.  Patient was diuresed with improvement of symptoms.  He had left-sided thoracentesis with 1.5 L of pleural fluid.  ROS: ***  Studies Reviewed: .        *** Risk Assessment/Calculations:   {Does this patient have ATRIAL FIBRILLATION?[phone]} No BP recorded.  {Refresh Note OR Click here to enter BP  :1}***       Physical Exam:   VS:  There were no vitals taken for this visit.   Wt Readings from Last 3 Encounters:  04/29/23 233 lb 11 oz (106 kg)  04/28/23 233 lb 11 oz (106 kg)  04/15/23 243 lb (110.2 kg)    GEN: Well nourished, well developed in no acute distress NECK: No JVD; No carotid bruits CARDIAC: ***RRR, no murmurs, rubs, gallops RESPIRATORY:  Clear to auscultation without rales, wheezing or rhonchi  ABDOMEN: Soft, non-tender, non-distended EXTREMITIES:  No edema; No deformity   ASSESSMENT AND PLAN: .   ***    {Are you ordering a CV Procedure (e.g.  stress test, cath, DCCV, TEE, etc)?   Press F2        :[id number]}  Dispo: ***  Signed, Ottis Vacha VEAR Fishman, PA-C

## 2023-05-13 ENCOUNTER — Other Ambulatory Visit: Payer: Medicaid Other

## 2023-05-13 ENCOUNTER — Ambulatory Visit: Payer: Medicaid Other | Admitting: Oncology

## 2023-05-13 ENCOUNTER — Ambulatory Visit: Payer: Medicaid Other

## 2023-05-14 ENCOUNTER — Encounter: Payer: Self-pay | Admitting: Oncology

## 2023-05-20 ENCOUNTER — Ambulatory Visit: Payer: Medicaid Other

## 2023-05-20 ENCOUNTER — Other Ambulatory Visit: Payer: Medicaid Other

## 2023-05-20 ENCOUNTER — Ambulatory Visit: Payer: Medicaid Other | Admitting: Oncology

## 2023-05-27 ENCOUNTER — Ambulatory Visit: Payer: Medicaid Other

## 2023-05-27 ENCOUNTER — Ambulatory Visit: Payer: Medicaid Other | Admitting: Oncology

## 2023-05-27 ENCOUNTER — Other Ambulatory Visit: Payer: Medicaid Other

## 2023-05-31 NOTE — TOC Transition Note (Signed)
 Transition of Care [facility]) - Discharge Note   Patient Details  Name: Jeremiah Vance MRN: [id number] Date of Birth: October 09, 1970  Transition of Care [facility]) CM/SW Contact:  Yusra Ravert A Nilton Lave, RN Phone Number: May 23, 2023, 12:23 AM   Clinical Narrative:    Late entry for 05/06/23.  I have spoken with patient sister [name].  I have informed her that provider has recommended Hospice Home.  [name] would like to use [facility].  I have made Consult to [facility] with [facility].    [name] informs me that patient has been approved for [facility].  [name] reports that at the [facility] will have a bed for patient today.    I have informed staff nurse of the above information.    [facility] will transport patient to [facility].     Final next level of care: [facility]) Barriers to Discharge: No Barriers Identified   Patient Goals and CMS Choice   CMS Medicare.gov Compare Post Acute Care list provided to:: Patient Represenative (must comment) (Patient's sister) Choice offered to / list presented to : Sibling      Discharge Placement                Patient to be transferred to facility by: [facility] Name of family member notified: [name] ( patient's sister) Patient and family notified of of transfer: 05/06/23  Discharge Plan and Services Additional resources added to the After Visit Summary for                                       Social Drivers of Health (SDOH) Interventions SDOH Screenings   Food Insecurity: No Food Insecurity (04/30/2023)  Housing: Low Risk  (04/30/2023)  Transportation Needs: No Transportation Needs (04/30/2023)  Utilities: Not At Risk (04/30/2023)  Alcohol  Screen: Low Risk  (11/28/2021)  Depression (PHQ2-9): Medium Risk (02/20/2023)  Financial Resource Strain: Low Risk  (04/24/2023)  Social Connections: Moderately Isolated (04/30/2023)   Tobacco Use: Low Risk  (04/30/2023)     Readmission Risk Interventions    09/29/2022   10:04 AM  Readmission Risk Prevention Plan  Transportation Screening Complete  Medication Review (RN Care Manager) Complete  PCP or Specialist appointment within 3-5 days of discharge Complete  HRI or Home Care Consult Complete  SW Recovery Care/Counseling Consult Complete  Palliative Care Screening Not Applicable  Skilled Nursing Facility Not Applicable

## 2023-05-31 DEATH — deceased

## 2023-06-03 ENCOUNTER — Other Ambulatory Visit: Payer: Medicaid Other

## 2023-06-03 ENCOUNTER — Ambulatory Visit: Payer: Medicaid Other

## 2023-06-03 ENCOUNTER — Ambulatory Visit: Payer: Medicaid Other | Admitting: Oncology

## 2023-07-28 ENCOUNTER — Ambulatory Visit: Payer: Medicaid Other | Admitting: Neurology

## 2023-09-11 ENCOUNTER — Ambulatory Visit: Payer: Medicaid Other | Admitting: Neurology
